# Patient Record
Sex: Male | Born: 1980 | Race: Black or African American | Hispanic: No | Marital: Single | Smoking: Current some day smoker
Health system: Southern US, Community
[De-identification: ages and names within clinical notes are randomized; demographics above are authoritative.]

## PROBLEM LIST (undated history)

## (undated) ENCOUNTER — Emergency Department (HOSPITAL_COMMUNITY): Admission: EM | Payer: Commercial Managed Care - HMO | Source: Home / Self Care

## (undated) DIAGNOSIS — R569 Unspecified convulsions: Secondary | ICD-10-CM

## (undated) DIAGNOSIS — I209 Angina pectoris, unspecified: Secondary | ICD-10-CM

## (undated) DIAGNOSIS — U071 COVID-19: Secondary | ICD-10-CM

## (undated) DIAGNOSIS — R0602 Shortness of breath: Secondary | ICD-10-CM

## (undated) DIAGNOSIS — I739 Peripheral vascular disease, unspecified: Secondary | ICD-10-CM

## (undated) DIAGNOSIS — C50919 Malignant neoplasm of unspecified site of unspecified female breast: Secondary | ICD-10-CM

## (undated) DIAGNOSIS — J9601 Acute respiratory failure with hypoxia: Secondary | ICD-10-CM

## (undated) DIAGNOSIS — I2510 Atherosclerotic heart disease of native coronary artery without angina pectoris: Secondary | ICD-10-CM

## (undated) DIAGNOSIS — T884XXA Failed or difficult intubation, initial encounter: Secondary | ICD-10-CM

## (undated) DIAGNOSIS — E7800 Pure hypercholesterolemia, unspecified: Secondary | ICD-10-CM

## (undated) DIAGNOSIS — F329 Major depressive disorder, single episode, unspecified: Secondary | ICD-10-CM

## (undated) DIAGNOSIS — Z915 Personal history of self-harm: Secondary | ICD-10-CM

## (undated) DIAGNOSIS — C801 Malignant (primary) neoplasm, unspecified: Secondary | ICD-10-CM

## (undated) DIAGNOSIS — F32A Depression, unspecified: Secondary | ICD-10-CM

## (undated) DIAGNOSIS — R51 Headache: Secondary | ICD-10-CM

## (undated) DIAGNOSIS — F419 Anxiety disorder, unspecified: Secondary | ICD-10-CM

## (undated) DIAGNOSIS — K7460 Unspecified cirrhosis of liver: Secondary | ICD-10-CM

## (undated) DIAGNOSIS — K859 Acute pancreatitis without necrosis or infection, unspecified: Secondary | ICD-10-CM

## (undated) DIAGNOSIS — F319 Bipolar disorder, unspecified: Secondary | ICD-10-CM

## (undated) DIAGNOSIS — F209 Schizophrenia, unspecified: Secondary | ICD-10-CM

## (undated) DIAGNOSIS — I10 Essential (primary) hypertension: Secondary | ICD-10-CM

## (undated) HISTORY — PX: OTHER SURGICAL HISTORY: SHX169

## (undated) HISTORY — PX: BREAST SURGERY: SHX581

## (undated) HISTORY — PX: NEPHRECTOMY: SHX65

## (undated) HISTORY — PX: CHEST SURGERY: SHX595

## (undated) HISTORY — DX: Acute respiratory failure with hypoxia: J96.01

## (undated) HISTORY — PX: MASTECTOMY: SHX3

## (undated) HISTORY — DX: Peripheral vascular disease, unspecified: I73.9

---

## 2006-08-30 ENCOUNTER — Emergency Department (HOSPITAL_COMMUNITY): Admission: EM | Admit: 2006-08-30 | Discharge: 2006-08-30 | Payer: Self-pay | Admitting: Emergency Medicine

## 2006-10-17 ENCOUNTER — Emergency Department (HOSPITAL_COMMUNITY): Admission: EM | Admit: 2006-10-17 | Discharge: 2006-10-17 | Payer: Self-pay | Admitting: Emergency Medicine

## 2006-10-18 ENCOUNTER — Inpatient Hospital Stay (HOSPITAL_COMMUNITY): Admission: EM | Admit: 2006-10-18 | Discharge: 2006-10-25 | Payer: Self-pay | Admitting: Psychiatry

## 2006-10-18 ENCOUNTER — Ambulatory Visit: Payer: Self-pay | Admitting: Psychiatry

## 2006-10-21 ENCOUNTER — Ambulatory Visit (HOSPITAL_COMMUNITY): Admission: RE | Admit: 2006-10-21 | Discharge: 2006-10-21 | Payer: Self-pay | Admitting: Psychiatry

## 2006-10-31 ENCOUNTER — Inpatient Hospital Stay (HOSPITAL_COMMUNITY): Admission: EM | Admit: 2006-10-31 | Discharge: 2006-11-03 | Payer: Self-pay | Admitting: Emergency Medicine

## 2006-12-05 ENCOUNTER — Emergency Department (HOSPITAL_COMMUNITY): Admission: EM | Admit: 2006-12-05 | Discharge: 2006-12-05 | Payer: Self-pay | Admitting: Emergency Medicine

## 2006-12-16 ENCOUNTER — Ambulatory Visit: Payer: Self-pay | Admitting: Internal Medicine

## 2006-12-16 LAB — CONVERTED CEMR LAB: Creatinine, Ser: 1 mg/dL

## 2006-12-19 ENCOUNTER — Ambulatory Visit: Payer: Self-pay | Admitting: *Deleted

## 2007-02-10 ENCOUNTER — Ambulatory Visit: Payer: Self-pay | Admitting: Internal Medicine

## 2007-02-10 LAB — CONVERTED CEMR LAB
Basophils Absolute: 0.1 10*3/uL (ref 0.0–0.1)
Basophils Relative: 1 % (ref 0–1)
HDL: 59 mg/dL (ref >39)
Hemoglobin: 15.4 g/dL (ref 13.0–17.0)
LDL Cholesterol: 318 mg/dL — ABNORMAL HIGH (ref 0–99)
Lymphocytes Relative: 40 % (ref 12–46)
Monocytes Absolute: 0.4 10*3/uL (ref 0.2–0.7)
Monocytes Relative: 9 % (ref 3–11)
Neutro Abs: 1.8 10*3/uL (ref 1.7–7.7)
Neutrophils Relative %: 41 % — ABNORMAL LOW (ref 43–77)
RBC: 5.17 M/uL (ref 4.22–5.81)
Total CHOL/HDL Ratio: 6.8
Triglycerides: 126 mg/dL (ref <150)

## 2007-02-23 ENCOUNTER — Ambulatory Visit: Payer: Self-pay | Admitting: Internal Medicine

## 2007-03-07 ENCOUNTER — Ambulatory Visit (HOSPITAL_COMMUNITY): Admission: RE | Admit: 2007-03-07 | Discharge: 2007-03-07 | Payer: Self-pay | Admitting: Internal Medicine

## 2007-03-07 DIAGNOSIS — R928 Other abnormal and inconclusive findings on diagnostic imaging of breast: Secondary | ICD-10-CM | POA: Insufficient documentation

## 2007-03-16 ENCOUNTER — Telehealth (INDEPENDENT_AMBULATORY_CARE_PROVIDER_SITE_OTHER): Payer: Self-pay | Admitting: *Deleted

## 2007-03-21 ENCOUNTER — Encounter (INDEPENDENT_AMBULATORY_CARE_PROVIDER_SITE_OTHER): Payer: Self-pay | Admitting: Internal Medicine

## 2007-03-21 DIAGNOSIS — I10 Essential (primary) hypertension: Secondary | ICD-10-CM | POA: Insufficient documentation

## 2007-03-21 DIAGNOSIS — E7800 Pure hypercholesterolemia, unspecified: Secondary | ICD-10-CM | POA: Insufficient documentation

## 2007-03-21 DIAGNOSIS — E785 Hyperlipidemia, unspecified: Secondary | ICD-10-CM | POA: Insufficient documentation

## 2007-03-21 HISTORY — DX: Essential (primary) hypertension: I10

## 2007-03-25 DIAGNOSIS — F1021 Alcohol dependence, in remission: Secondary | ICD-10-CM | POA: Insufficient documentation

## 2007-03-25 DIAGNOSIS — F17200 Nicotine dependence, unspecified, uncomplicated: Secondary | ICD-10-CM

## 2007-03-25 DIAGNOSIS — F640 Transsexualism: Secondary | ICD-10-CM

## 2007-04-06 ENCOUNTER — Ambulatory Visit: Payer: Self-pay | Admitting: Internal Medicine

## 2007-04-13 ENCOUNTER — Ambulatory Visit: Payer: Self-pay | Admitting: Internal Medicine

## 2007-04-24 LAB — CONVERTED CEMR LAB
ALT: 83 U/L — ABNORMAL HIGH
AST: 98 U/L — ABNORMAL HIGH
Albumin: 4.6 g/dL
Alkaline Phosphatase: 44 U/L
Bilirubin, Direct: 0.1 mg/dL
Cholesterol: 406 mg/dL — ABNORMAL HIGH
HDL: 77 mg/dL
Indirect Bilirubin: 0.7 mg/dL
LDL Cholesterol: 313 mg/dL — ABNORMAL HIGH
Total Bilirubin: 0.8 mg/dL
Total CHOL/HDL Ratio: 5.3
Total Protein: 7.9 g/dL
Triglycerides: 80 mg/dL
VLDL: 16 mg/dL

## 2007-05-01 ENCOUNTER — Encounter (INDEPENDENT_AMBULATORY_CARE_PROVIDER_SITE_OTHER): Payer: Self-pay | Admitting: Internal Medicine

## 2007-05-01 DIAGNOSIS — D4959 Neoplasm of unspecified behavior of other genitourinary organ: Secondary | ICD-10-CM

## 2007-05-02 ENCOUNTER — Inpatient Hospital Stay (HOSPITAL_COMMUNITY): Admission: EM | Admit: 2007-05-02 | Discharge: 2007-05-08 | Payer: Self-pay | Admitting: Emergency Medicine

## 2007-05-09 ENCOUNTER — Telehealth (INDEPENDENT_AMBULATORY_CARE_PROVIDER_SITE_OTHER): Payer: Self-pay | Admitting: *Deleted

## 2007-05-23 ENCOUNTER — Ambulatory Visit: Payer: Self-pay | Admitting: Internal Medicine

## 2007-05-23 DIAGNOSIS — F1910 Other psychoactive substance abuse, uncomplicated: Secondary | ICD-10-CM | POA: Insufficient documentation

## 2007-05-23 LAB — CONVERTED CEMR LAB
Bilirubin Urine: NEGATIVE
Blood in Urine, dipstick: NEGATIVE
Glucose, Urine, Semiquant: NEGATIVE
Ketones, urine, test strip: NEGATIVE
Protein, U semiquant: NEGATIVE
pH: 7.5

## 2007-05-25 ENCOUNTER — Ambulatory Visit (HOSPITAL_COMMUNITY): Admission: RE | Admit: 2007-05-25 | Discharge: 2007-05-25 | Payer: Self-pay | Admitting: Internal Medicine

## 2007-05-26 ENCOUNTER — Ambulatory Visit: Payer: Self-pay | Admitting: Internal Medicine

## 2007-05-28 ENCOUNTER — Encounter (INDEPENDENT_AMBULATORY_CARE_PROVIDER_SITE_OTHER): Payer: Self-pay | Admitting: Internal Medicine

## 2007-06-22 ENCOUNTER — Ambulatory Visit: Payer: Self-pay | Admitting: Internal Medicine

## 2007-06-22 DIAGNOSIS — R5383 Other fatigue: Secondary | ICD-10-CM

## 2007-06-22 DIAGNOSIS — R5381 Other malaise: Secondary | ICD-10-CM | POA: Insufficient documentation

## 2007-06-26 ENCOUNTER — Ambulatory Visit: Payer: Self-pay | Admitting: Internal Medicine

## 2007-07-02 ENCOUNTER — Encounter (INDEPENDENT_AMBULATORY_CARE_PROVIDER_SITE_OTHER): Payer: Self-pay | Admitting: Internal Medicine

## 2007-07-02 LAB — CONVERTED CEMR LAB
BUN: 13 mg/dL (ref 6–23)
CO2: 27 meq/L (ref 19–32)
Cholesterol: 260 mg/dL — ABNORMAL HIGH (ref 0–200)
Eosinophils Absolute: 0.2 10*3/uL (ref 0.2–0.7)
Eosinophils Relative: 6 % — ABNORMAL HIGH (ref 0–5)
Glucose, Bld: 79 mg/dL (ref 70–99)
HCT: 43.8 % (ref 39.0–52.0)
HDL: 43 mg/dL (ref >39)
Hemoglobin: 14.6 g/dL (ref 13.0–17.0)
Lymphocytes Relative: 45 % (ref 12–46)
Lymphs Abs: 1.7 10*3/uL (ref 0.7–4.0)
MCV: 87.4 fL (ref 78.0–100.0)
Monocytes Absolute: 0.4 10*3/uL (ref 0.1–1.0)
Monocytes Relative: 10 % (ref 3–12)
Platelets: 228 10*3/uL (ref 150–400)
RBC: 5.01 M/uL (ref 4.22–5.81)
Sodium: 142 meq/L (ref 135–145)
Total Bilirubin: 0.4 mg/dL (ref 0.3–1.2)
Total Protein: 7.2 g/dL (ref 6.0–8.3)
Triglycerides: 77 mg/dL (ref <150)
VLDL: 15 mg/dL (ref 0–40)
WBC: 3.9 10*3/uL — ABNORMAL LOW (ref 4.0–10.5)

## 2007-08-22 ENCOUNTER — Encounter (INDEPENDENT_AMBULATORY_CARE_PROVIDER_SITE_OTHER): Payer: Self-pay | Admitting: Internal Medicine

## 2007-10-09 ENCOUNTER — Emergency Department (HOSPITAL_COMMUNITY): Admission: EM | Admit: 2007-10-09 | Discharge: 2007-10-10 | Payer: Self-pay | Admitting: Emergency Medicine

## 2007-12-17 ENCOUNTER — Encounter (INDEPENDENT_AMBULATORY_CARE_PROVIDER_SITE_OTHER): Payer: Self-pay | Admitting: Internal Medicine

## 2007-12-17 ENCOUNTER — Emergency Department (HOSPITAL_COMMUNITY): Admission: EM | Admit: 2007-12-17 | Discharge: 2007-12-17 | Payer: Self-pay | Admitting: Emergency Medicine

## 2007-12-22 ENCOUNTER — Ambulatory Visit: Payer: Self-pay | Admitting: Internal Medicine

## 2007-12-22 DIAGNOSIS — R1013 Epigastric pain: Secondary | ICD-10-CM | POA: Insufficient documentation

## 2007-12-22 DIAGNOSIS — L0231 Cutaneous abscess of buttock: Secondary | ICD-10-CM | POA: Insufficient documentation

## 2007-12-22 DIAGNOSIS — F410 Panic disorder [episodic paroxysmal anxiety] without agoraphobia: Secondary | ICD-10-CM

## 2007-12-22 DIAGNOSIS — L03317 Cellulitis of buttock: Secondary | ICD-10-CM

## 2007-12-22 LAB — CONVERTED CEMR LAB
Nitrite: POSITIVE
Protein, U semiquant: >=300
Urobilinogen, UA: >=8
pH: 6.5

## 2007-12-23 ENCOUNTER — Encounter (INDEPENDENT_AMBULATORY_CARE_PROVIDER_SITE_OTHER): Payer: Self-pay | Admitting: Internal Medicine

## 2007-12-25 ENCOUNTER — Encounter (INDEPENDENT_AMBULATORY_CARE_PROVIDER_SITE_OTHER): Payer: Self-pay | Admitting: *Deleted

## 2007-12-29 ENCOUNTER — Ambulatory Visit: Payer: Self-pay | Admitting: Internal Medicine

## 2007-12-29 DIAGNOSIS — R634 Abnormal weight loss: Secondary | ICD-10-CM | POA: Insufficient documentation

## 2007-12-29 DIAGNOSIS — R945 Abnormal results of liver function studies: Secondary | ICD-10-CM

## 2007-12-29 DIAGNOSIS — R7989 Other specified abnormal findings of blood chemistry: Secondary | ICD-10-CM | POA: Insufficient documentation

## 2007-12-29 LAB — CONVERTED CEMR LAB
ALT: 115 units/L — ABNORMAL HIGH (ref 0–53)
AST: 76 units/L — ABNORMAL HIGH (ref 0–37)
Albumin: 4.1 g/dL (ref 3.5–5.2)
Alkaline Phosphatase: 37 units/L — ABNORMAL LOW (ref 39–117)
BUN: 7 mg/dL (ref 6–23)
Bilirubin, Direct: 0.1 mg/dL (ref 0.0–0.3)
CO2: 23 meq/L (ref 19–32)
Calcium: 9.2 mg/dL (ref 8.4–10.5)
Chloride: 100 meq/L (ref 96–112)
Cholesterol: 259 mg/dL — ABNORMAL HIGH (ref 0–200)
Creatinine, Ser: 1.08 mg/dL (ref 0.40–1.50)
Glucose, Bld: 74 mg/dL (ref 70–99)
HCV Ab: NEGATIVE
HDL: 44 mg/dL (ref >39)
Hep A Total Ab: NEGATIVE
Hep B Core Total Ab: NEGATIVE
Hep B S Ab: POSITIVE — AB
Hepatitis B Surface Ag: NEGATIVE
Indirect Bilirubin: 0.3 mg/dL (ref 0.0–0.9)
LDL Cholesterol: 199 mg/dL — ABNORMAL HIGH (ref 0–99)
Potassium: 4.2 meq/L (ref 3.5–5.3)
Sodium: 136 meq/L (ref 135–145)
Total Bilirubin: 0.4 mg/dL (ref 0.3–1.2)
Total CHOL/HDL Ratio: 5.9
Total Protein: 7.1 g/dL (ref 6.0–8.3)
Triglycerides: 80 mg/dL (ref <150)
VLDL: 16 mg/dL (ref 0–40)

## 2008-04-04 ENCOUNTER — Inpatient Hospital Stay (HOSPITAL_COMMUNITY): Admission: EM | Admit: 2008-04-04 | Discharge: 2008-04-09 | Payer: Self-pay | Admitting: *Deleted

## 2008-04-05 ENCOUNTER — Ambulatory Visit: Payer: Self-pay | Admitting: Psychiatry

## 2008-04-12 ENCOUNTER — Ambulatory Visit: Payer: Self-pay | Admitting: Internal Medicine

## 2008-04-15 ENCOUNTER — Ambulatory Visit: Payer: Self-pay | Admitting: Internal Medicine

## 2008-04-18 ENCOUNTER — Encounter (INDEPENDENT_AMBULATORY_CARE_PROVIDER_SITE_OTHER): Payer: Self-pay | Admitting: *Deleted

## 2008-05-17 ENCOUNTER — Emergency Department (HOSPITAL_COMMUNITY): Admission: EM | Admit: 2008-05-17 | Discharge: 2008-05-17 | Payer: Self-pay | Admitting: Emergency Medicine

## 2008-05-19 ENCOUNTER — Emergency Department (HOSPITAL_COMMUNITY): Admission: EM | Admit: 2008-05-19 | Discharge: 2008-05-20 | Payer: Self-pay | Admitting: Emergency Medicine

## 2008-05-30 ENCOUNTER — Telehealth (INDEPENDENT_AMBULATORY_CARE_PROVIDER_SITE_OTHER): Payer: Self-pay | Admitting: Internal Medicine

## 2008-06-27 ENCOUNTER — Ambulatory Visit: Payer: Self-pay | Admitting: Internal Medicine

## 2008-07-19 ENCOUNTER — Emergency Department (HOSPITAL_COMMUNITY): Admission: EM | Admit: 2008-07-19 | Discharge: 2008-07-19 | Payer: Self-pay | Admitting: Emergency Medicine

## 2008-07-24 ENCOUNTER — Inpatient Hospital Stay (HOSPITAL_COMMUNITY): Admission: EM | Admit: 2008-07-24 | Discharge: 2008-07-26 | Payer: Self-pay | Admitting: Emergency Medicine

## 2008-07-24 ENCOUNTER — Emergency Department (HOSPITAL_COMMUNITY): Admission: EM | Admit: 2008-07-24 | Discharge: 2008-07-24 | Payer: Self-pay | Admitting: Emergency Medicine

## 2008-08-30 ENCOUNTER — Ambulatory Visit: Payer: Self-pay | Admitting: Internal Medicine

## 2008-09-22 ENCOUNTER — Emergency Department (HOSPITAL_COMMUNITY): Admission: EM | Admit: 2008-09-22 | Discharge: 2008-09-23 | Payer: Self-pay | Admitting: Emergency Medicine

## 2008-09-24 ENCOUNTER — Encounter (INDEPENDENT_AMBULATORY_CARE_PROVIDER_SITE_OTHER): Payer: Self-pay | Admitting: Internal Medicine

## 2008-09-26 ENCOUNTER — Encounter (INDEPENDENT_AMBULATORY_CARE_PROVIDER_SITE_OTHER): Payer: Self-pay | Admitting: Internal Medicine

## 2008-10-01 ENCOUNTER — Telehealth (INDEPENDENT_AMBULATORY_CARE_PROVIDER_SITE_OTHER): Payer: Self-pay | Admitting: Internal Medicine

## 2008-10-03 ENCOUNTER — Ambulatory Visit: Payer: Self-pay | Admitting: Internal Medicine

## 2008-10-03 DIAGNOSIS — R569 Unspecified convulsions: Secondary | ICD-10-CM

## 2008-10-08 ENCOUNTER — Encounter (INDEPENDENT_AMBULATORY_CARE_PROVIDER_SITE_OTHER): Payer: Self-pay | Admitting: Internal Medicine

## 2008-10-09 ENCOUNTER — Encounter (INDEPENDENT_AMBULATORY_CARE_PROVIDER_SITE_OTHER): Payer: Self-pay | Admitting: Internal Medicine

## 2008-10-21 ENCOUNTER — Encounter (INDEPENDENT_AMBULATORY_CARE_PROVIDER_SITE_OTHER): Payer: Self-pay | Admitting: Internal Medicine

## 2008-11-10 ENCOUNTER — Emergency Department (HOSPITAL_COMMUNITY): Admission: EM | Admit: 2008-11-10 | Discharge: 2008-11-11 | Payer: Self-pay | Admitting: Emergency Medicine

## 2008-11-12 ENCOUNTER — Emergency Department (HOSPITAL_COMMUNITY): Admission: EM | Admit: 2008-11-12 | Discharge: 2008-11-12 | Payer: Self-pay | Admitting: Emergency Medicine

## 2008-11-13 ENCOUNTER — Inpatient Hospital Stay (HOSPITAL_COMMUNITY): Admission: EM | Admit: 2008-11-13 | Discharge: 2008-11-15 | Payer: Self-pay | Admitting: Emergency Medicine

## 2008-12-06 ENCOUNTER — Ambulatory Visit: Payer: Self-pay | Admitting: Internal Medicine

## 2008-12-06 DIAGNOSIS — N63 Unspecified lump in unspecified breast: Secondary | ICD-10-CM | POA: Insufficient documentation

## 2008-12-10 ENCOUNTER — Encounter: Admission: RE | Admit: 2008-12-10 | Discharge: 2008-12-10 | Payer: Self-pay | Admitting: Internal Medicine

## 2008-12-17 ENCOUNTER — Telehealth (INDEPENDENT_AMBULATORY_CARE_PROVIDER_SITE_OTHER): Payer: Self-pay | Admitting: Internal Medicine

## 2009-01-03 ENCOUNTER — Ambulatory Visit: Payer: Self-pay | Admitting: Internal Medicine

## 2009-01-07 ENCOUNTER — Encounter (INDEPENDENT_AMBULATORY_CARE_PROVIDER_SITE_OTHER): Payer: Self-pay | Admitting: Internal Medicine

## 2009-01-08 ENCOUNTER — Telehealth (INDEPENDENT_AMBULATORY_CARE_PROVIDER_SITE_OTHER): Payer: Self-pay | Admitting: Internal Medicine

## 2009-01-08 LAB — CONVERTED CEMR LAB
ALT: 80 units/L — ABNORMAL HIGH (ref 0–53)
AST: 134 units/L — ABNORMAL HIGH (ref 0–37)
Alkaline Phosphatase: 53 units/L (ref 39–117)
Alkaline Phosphatase: 52 units/L (ref 39–117)
Glucose, Bld: 75 mg/dL (ref 70–99)
Glucose, Bld: 87 mg/dL (ref 70–99)
HDL: 49 mg/dL (ref >39)
LDL Cholesterol: 215 mg/dL — ABNORMAL HIGH (ref 0–99)
LDL Cholesterol: 137 mg/dL — ABNORMAL HIGH (ref 0–99)
Sodium: 142 meq/L (ref 135–145)
Sodium: 143 meq/L (ref 135–145)
Total Bilirubin: 0.2 mg/dL — ABNORMAL LOW (ref 0.3–1.2)
Total Bilirubin: 0.5 mg/dL (ref 0.3–1.2)
Total Protein: 7 g/dL (ref 6.0–8.3)
Total Protein: 7.2 g/dL (ref 6.0–8.3)
Triglycerides: 112 mg/dL (ref <150)
Triglycerides: 64 mg/dL (ref <150)
VLDL: 22 mg/dL (ref 0–40)
VLDL: 13 mg/dL (ref 0–40)

## 2009-01-21 ENCOUNTER — Encounter (INDEPENDENT_AMBULATORY_CARE_PROVIDER_SITE_OTHER): Payer: Self-pay | Admitting: Internal Medicine

## 2009-01-22 ENCOUNTER — Encounter (INDEPENDENT_AMBULATORY_CARE_PROVIDER_SITE_OTHER): Payer: Self-pay | Admitting: Internal Medicine

## 2009-01-31 IMAGING — CR DG CHEST 2V
2 series · 2 of 2 positions shown · non-contrast
Comparison: None

CLINICAL DATA: Shortness of breath and cough. 
 CHEST - 2 VIEW:

[w chest pa *]
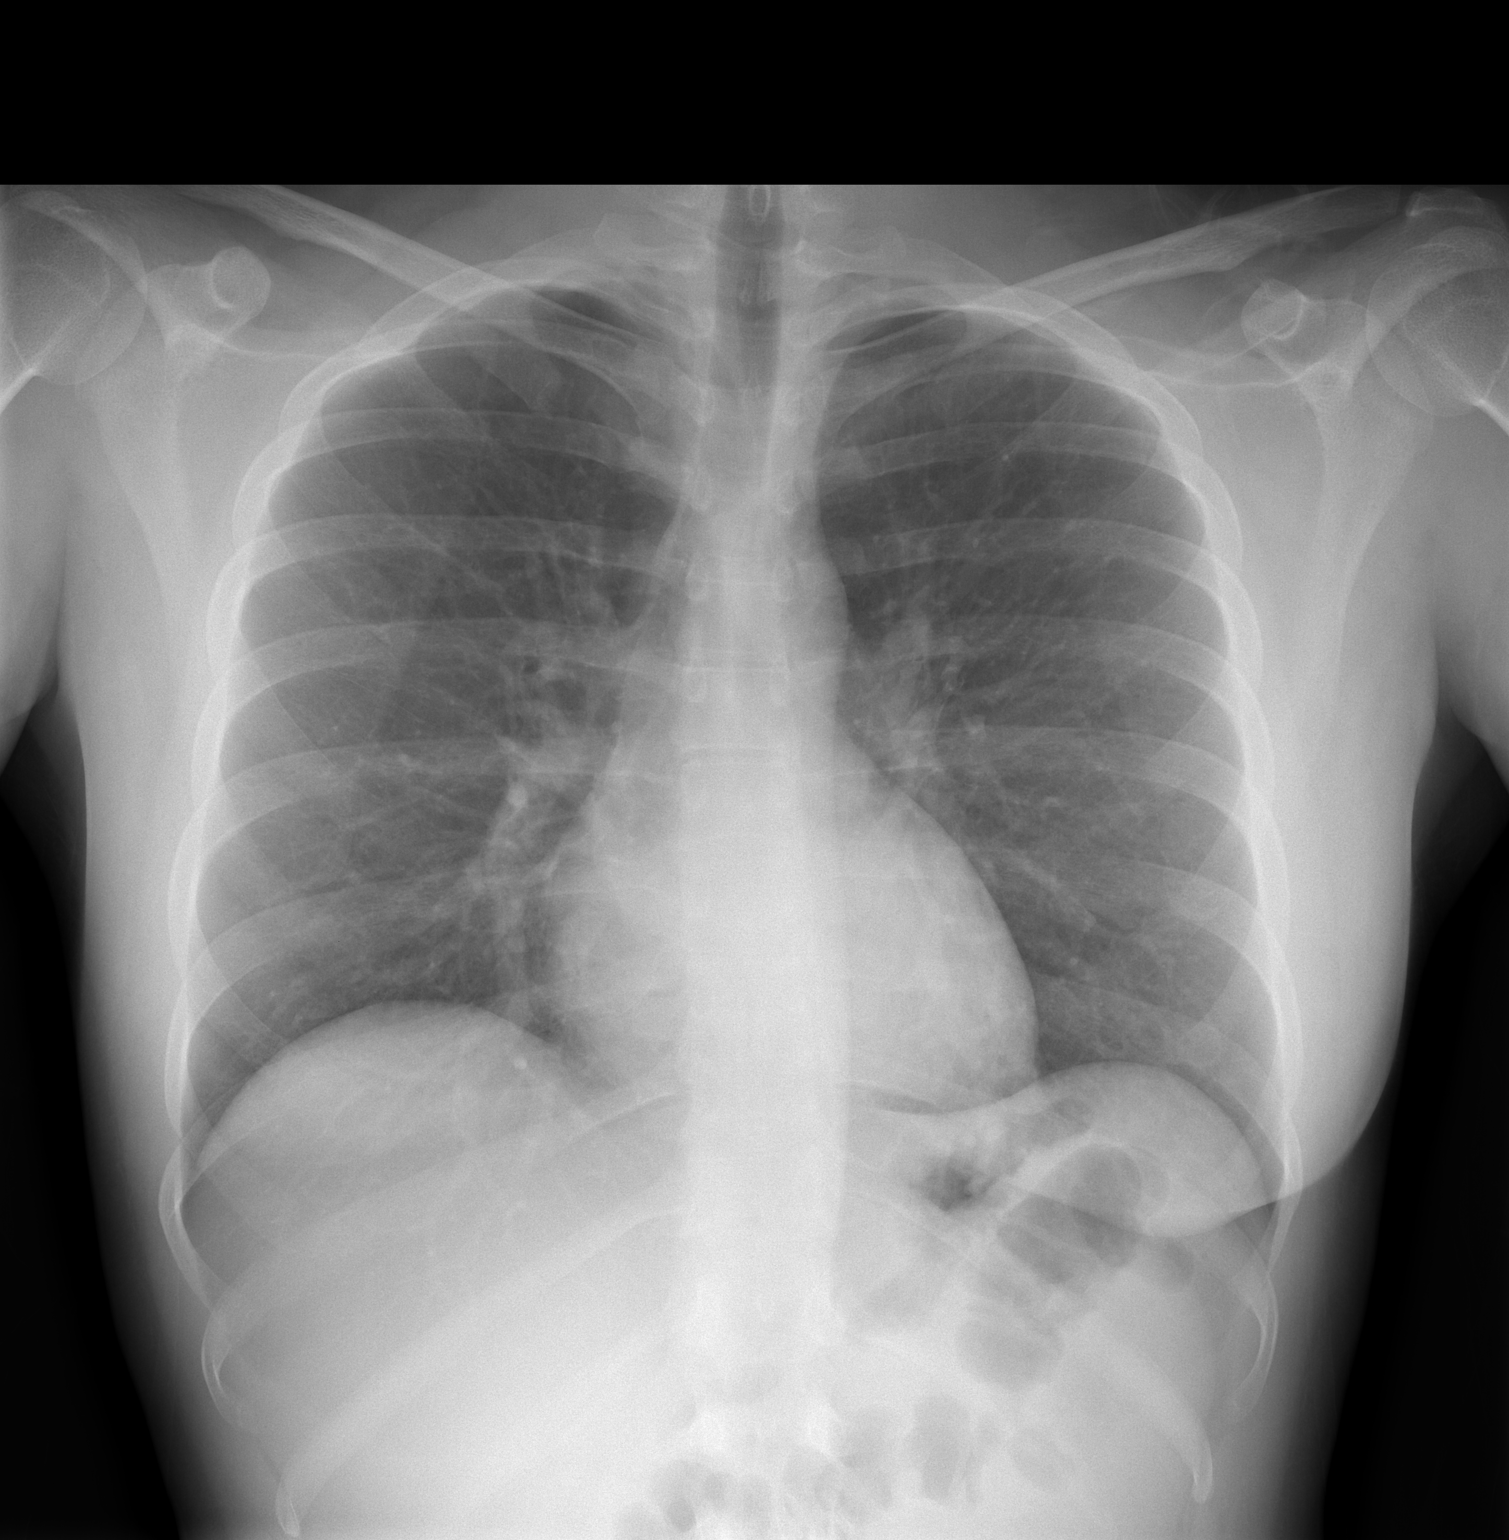

[w chest lat *]
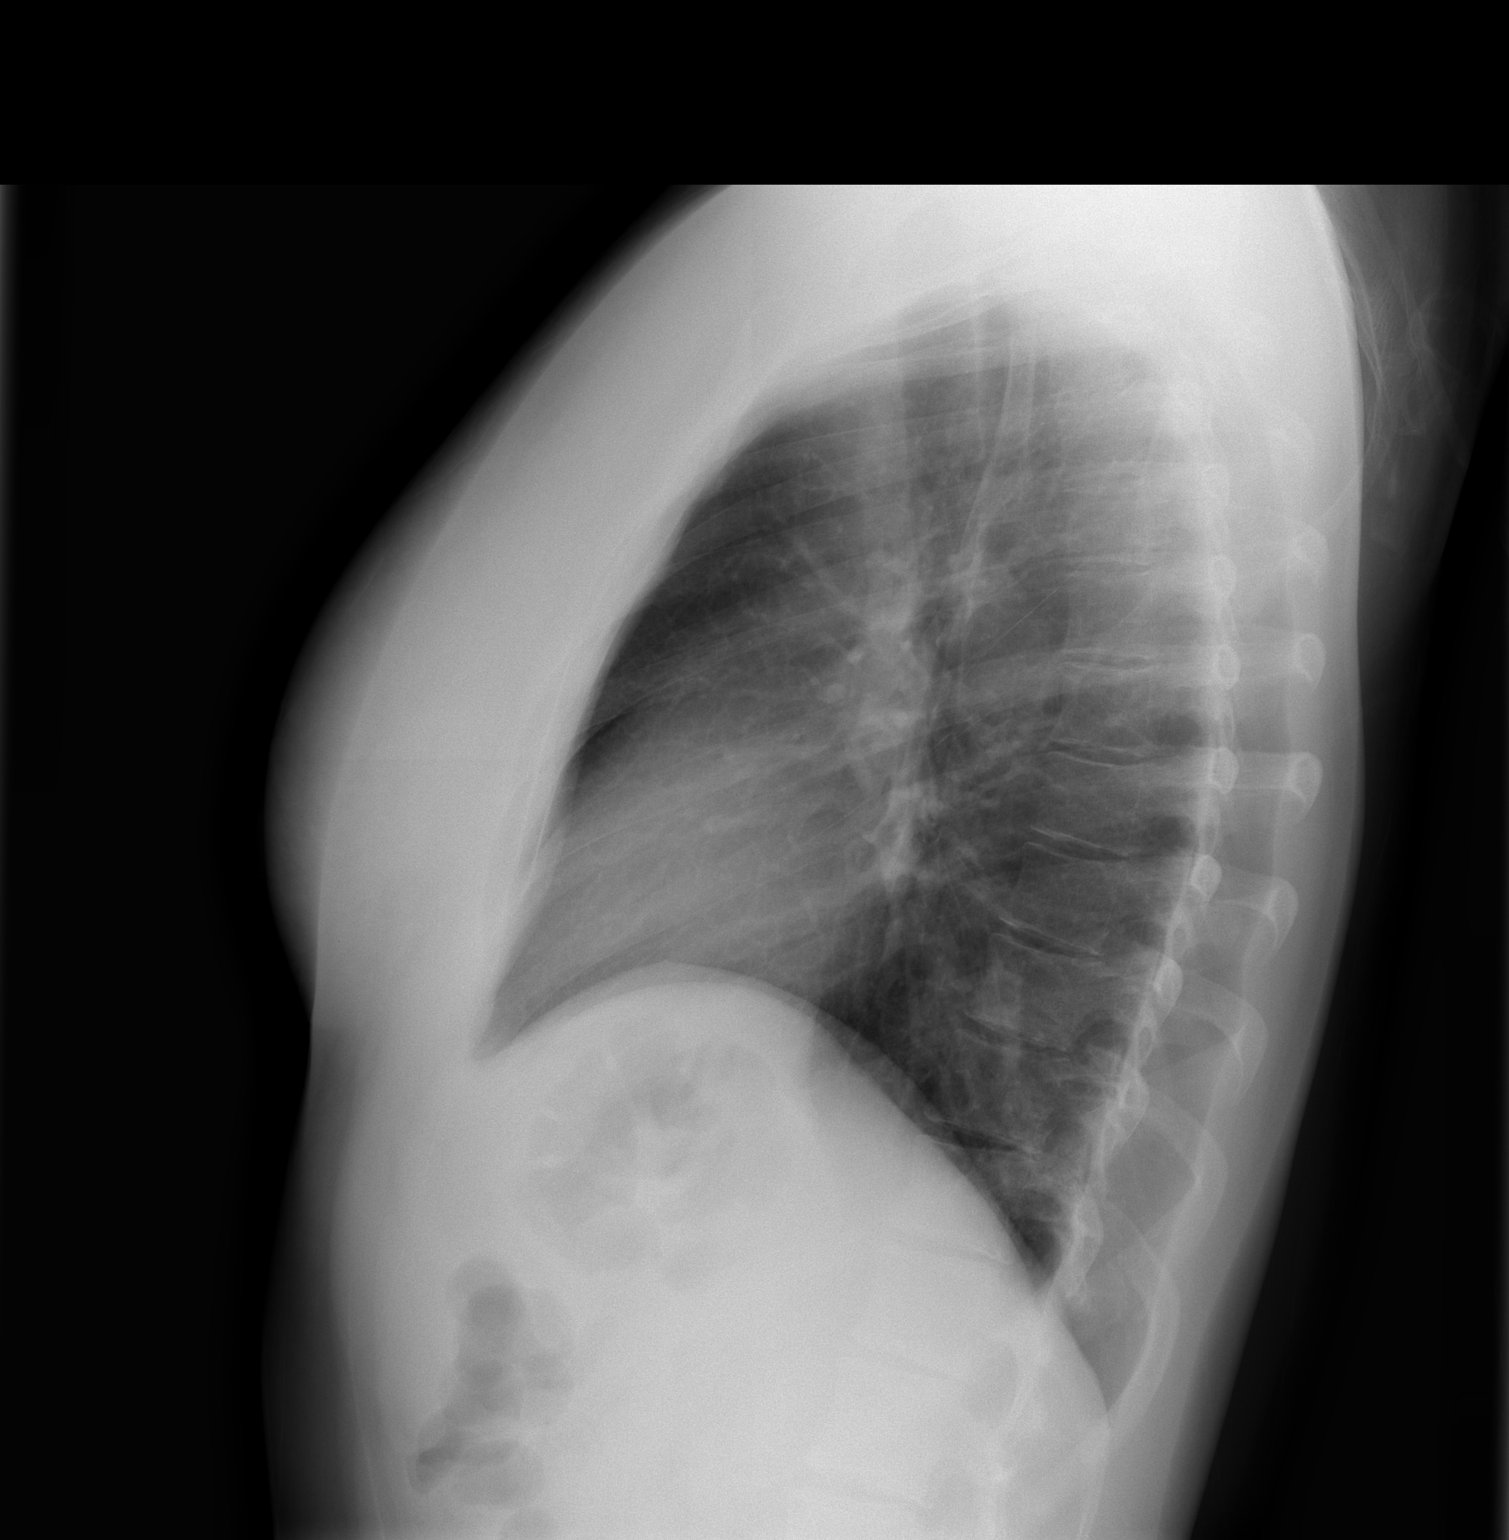

[2 of 2 positions shown; findings below may reference images not displayed]

FINDINGS: Heart size is normal. Lung volumes are suboptimal but clear.  No pleural effusion.  No pneumothorax.
IMPRESSION: No focal acute cardiopulmonary process.

## 2009-02-11 ENCOUNTER — Ambulatory Visit: Payer: Self-pay | Admitting: Internal Medicine

## 2009-02-20 ENCOUNTER — Ambulatory Visit (HOSPITAL_COMMUNITY): Admission: RE | Admit: 2009-02-20 | Discharge: 2009-02-22 | Payer: Self-pay | Admitting: General Surgery

## 2009-02-20 ENCOUNTER — Encounter (INDEPENDENT_AMBULATORY_CARE_PROVIDER_SITE_OTHER): Payer: Self-pay | Admitting: Internal Medicine

## 2009-02-20 ENCOUNTER — Encounter (INDEPENDENT_AMBULATORY_CARE_PROVIDER_SITE_OTHER): Payer: Self-pay | Admitting: General Surgery

## 2009-02-24 ENCOUNTER — Emergency Department (HOSPITAL_COMMUNITY): Admission: EM | Admit: 2009-02-24 | Discharge: 2009-02-24 | Payer: Self-pay | Admitting: Emergency Medicine

## 2009-03-03 ENCOUNTER — Encounter (INDEPENDENT_AMBULATORY_CARE_PROVIDER_SITE_OTHER): Payer: Self-pay | Admitting: Internal Medicine

## 2009-03-18 ENCOUNTER — Encounter (INDEPENDENT_AMBULATORY_CARE_PROVIDER_SITE_OTHER): Payer: Self-pay | Admitting: Internal Medicine

## 2009-03-24 IMAGING — US US ABDOMEN COMPLETE
1 series · 14 of 25 positions shown · non-contrast
Comparison: None

CLINICAL DATA: Tylenol overdose. Evaluate liver and gallbladder.

ABDOMEN ULTRASOUND
TECHNIQUE: Complete abdominal ultrasound examination was performed including
evaluation of the liver, gallbladder, bile ducts, pancreas, kidneys, spleen,
IVC, and abdominal aorta.

[Series 1: unknown · 0.27mm/px · 14 of 77 slices shown]
[im 1/77]
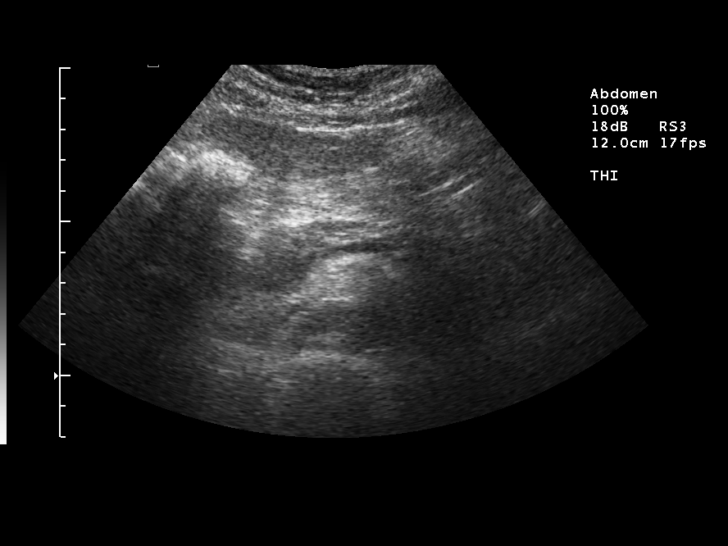
[im 7/77]
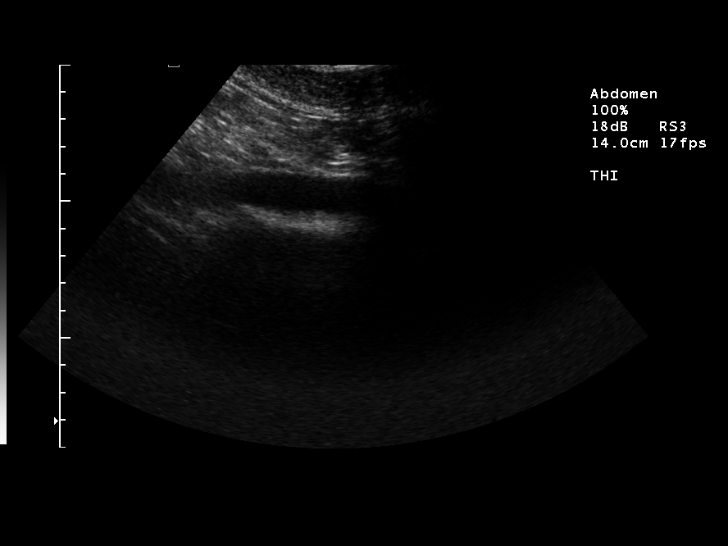
[im 13/77]
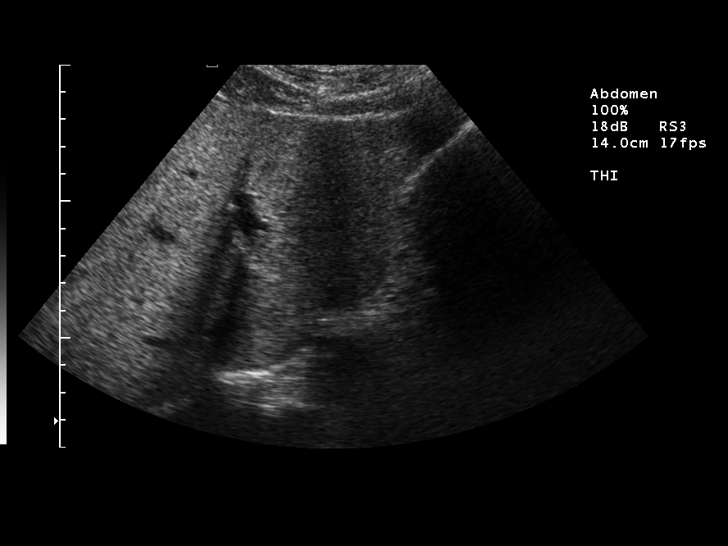
[im 20/77]
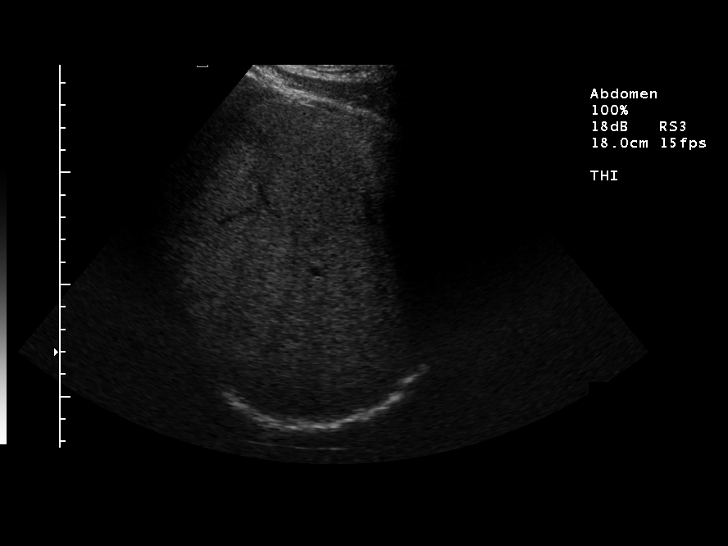
[im 26/77]
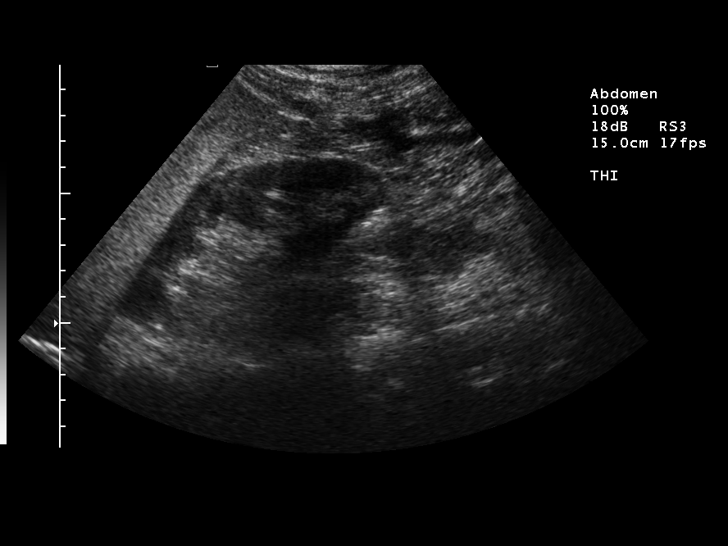
[im 29/77]
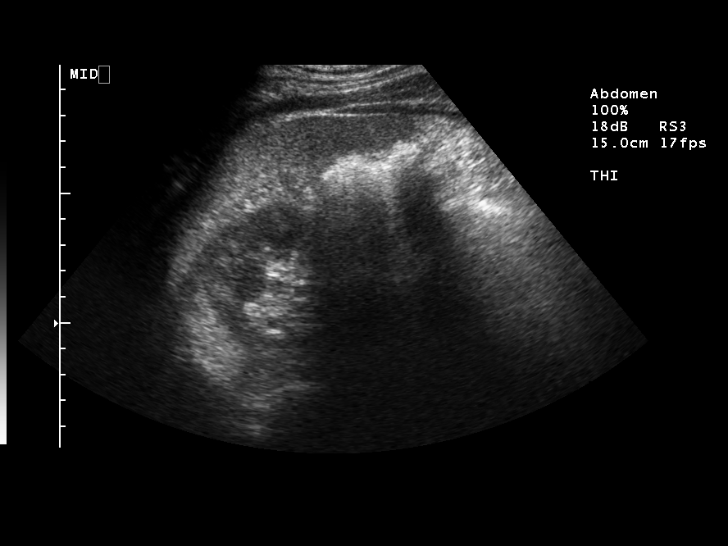
[im 35/77]
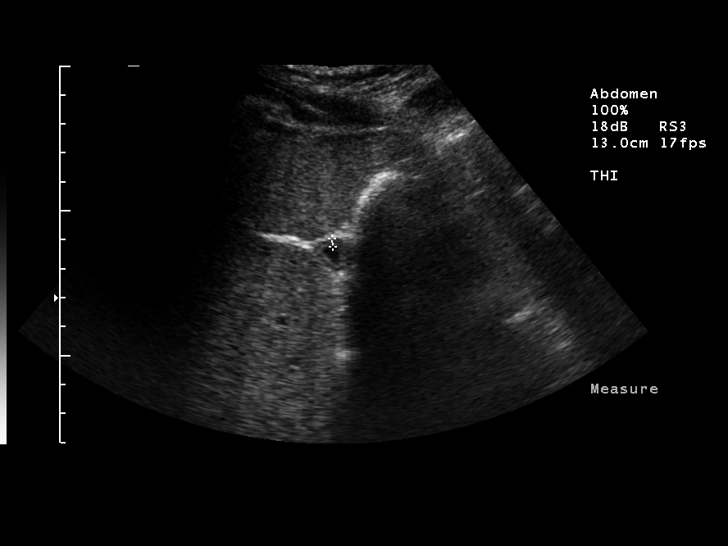
[im 42/77]
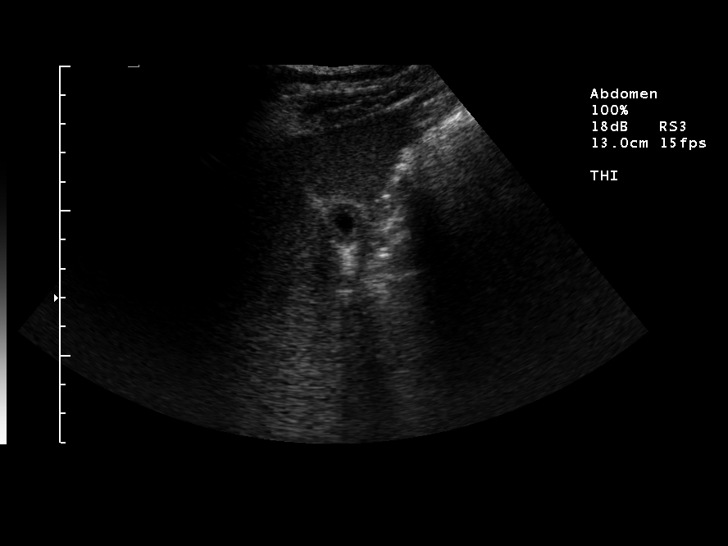
[im 48/77]
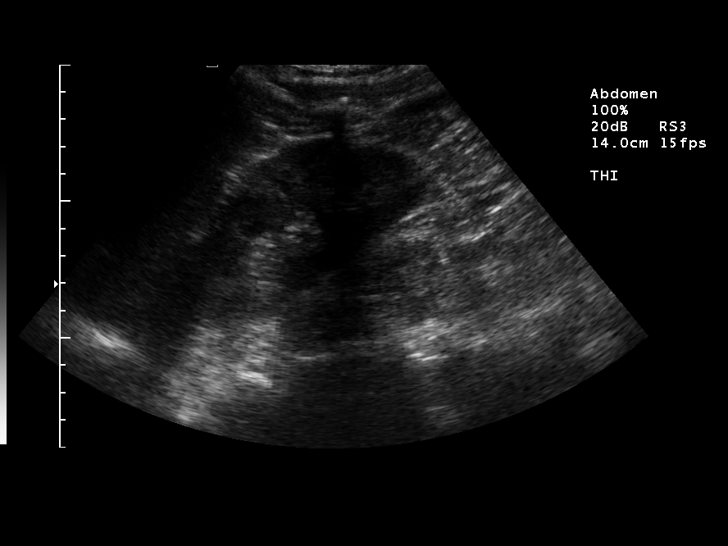
[im 51/77]
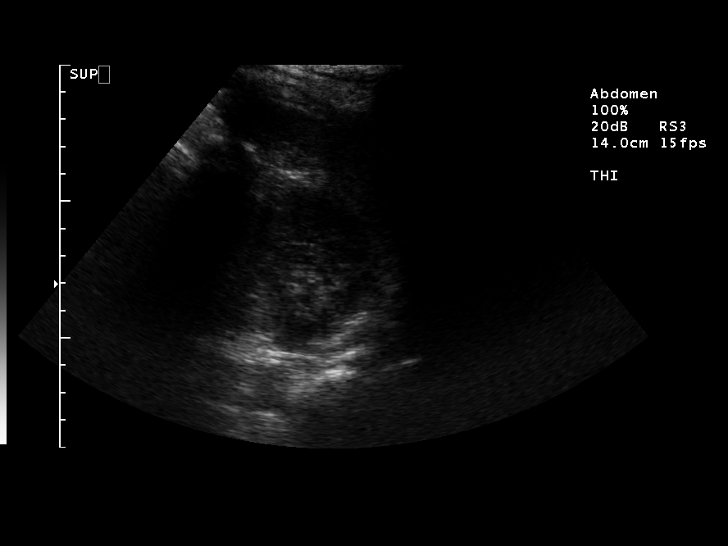
[im 58/77]
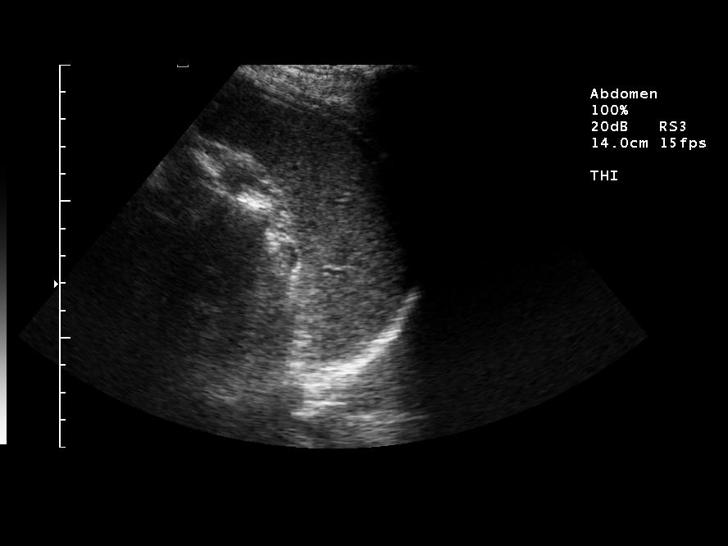
[im 64/77]
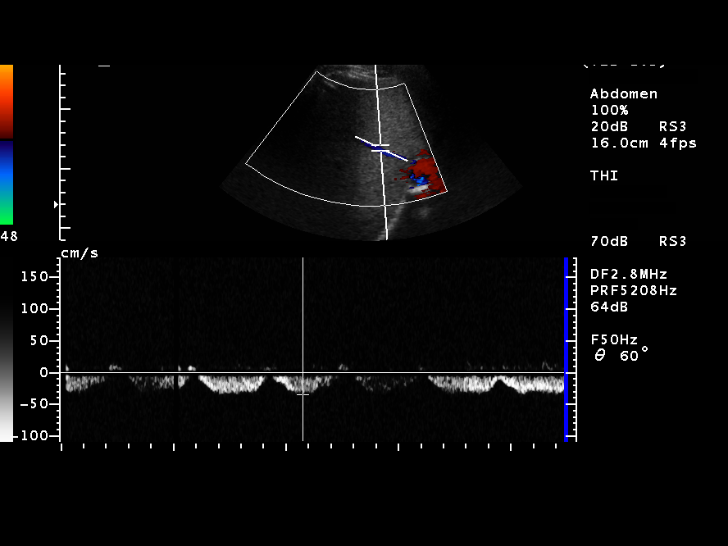
[im 70/77]
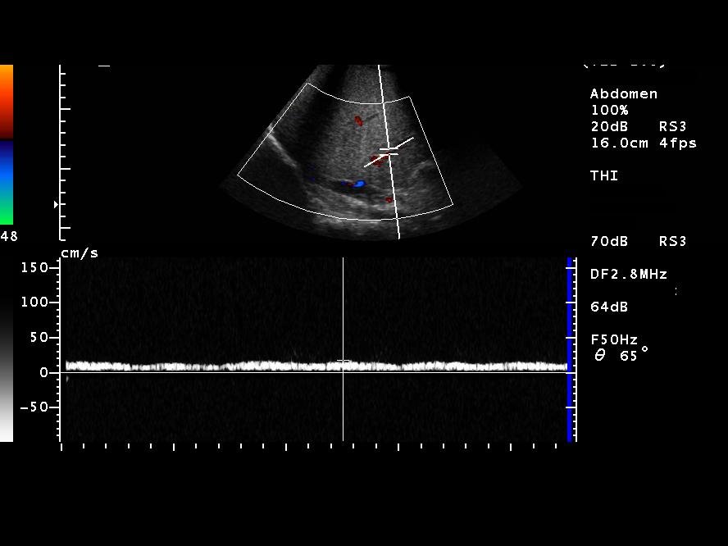
[im 77/77]
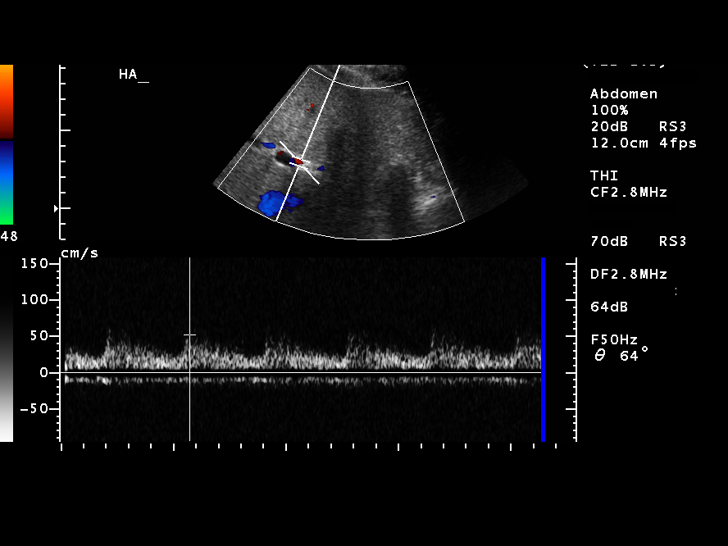

[14 of 25 positions shown; findings below may reference images not displayed]

FINDINGS: There is increased echotexture throughout the liver compatible with
mild fatty infiltration. Liver is not enlarged. Gallbladder is contracted,
causing wall thickening. No stones. Common bile duct normal at 3 mm.

IVC, pancreas, spleen, kidneys, abdominal aorta unremarkable.

IMPRESSION

Increased echotexture throughout the liver most compatible with fatty
infiltration.

Gallbladder is contracted, which causes wall thickening/accentuation. No stones.

ABDOMINAL DOPPLER:
FINDINGS: The portal vein is patent, with hepatopetal flow. Portal vein velocity
is 26 cm/sec. Hepatofugal flow seen within the hepatic veins. No ascites or
varices.
IMPRESSION: Patent portal vein with hepatopetal flow. No evidence of portal venous
hypertension.

## 2009-04-21 ENCOUNTER — Ambulatory Visit: Payer: Self-pay | Admitting: Internal Medicine

## 2009-04-24 ENCOUNTER — Ambulatory Visit: Payer: Self-pay | Admitting: Internal Medicine

## 2009-05-06 ENCOUNTER — Ambulatory Visit: Payer: Self-pay | Admitting: Internal Medicine

## 2009-05-11 ENCOUNTER — Emergency Department (HOSPITAL_COMMUNITY): Admission: EM | Admit: 2009-05-11 | Discharge: 2009-05-11 | Payer: Self-pay | Admitting: Emergency Medicine

## 2009-05-20 ENCOUNTER — Ambulatory Visit: Payer: Self-pay | Admitting: Internal Medicine

## 2009-05-28 ENCOUNTER — Encounter (INDEPENDENT_AMBULATORY_CARE_PROVIDER_SITE_OTHER): Payer: Self-pay | Admitting: Internal Medicine

## 2009-05-28 DIAGNOSIS — F339 Major depressive disorder, recurrent, unspecified: Secondary | ICD-10-CM | POA: Insufficient documentation

## 2009-06-10 LAB — CONVERTED CEMR LAB
HDL: 52 mg/dL (ref >39)
LDL Cholesterol: 180 mg/dL — ABNORMAL HIGH (ref 0–99)
Total CHOL/HDL Ratio: 4.8
Triglycerides: 76 mg/dL (ref <150)
VLDL: 15 mg/dL (ref 0–40)

## 2009-06-24 ENCOUNTER — Inpatient Hospital Stay (HOSPITAL_COMMUNITY): Admission: EM | Admit: 2009-06-24 | Discharge: 2009-06-28 | Payer: Self-pay | Admitting: Psychiatry

## 2009-06-24 ENCOUNTER — Emergency Department (HOSPITAL_COMMUNITY): Admission: EM | Admit: 2009-06-24 | Discharge: 2009-06-24 | Payer: Self-pay | Admitting: Emergency Medicine

## 2009-06-24 ENCOUNTER — Ambulatory Visit: Payer: Self-pay | Admitting: Psychiatry

## 2009-06-24 ENCOUNTER — Encounter (INDEPENDENT_AMBULATORY_CARE_PROVIDER_SITE_OTHER): Payer: Self-pay | Admitting: Internal Medicine

## 2009-07-03 ENCOUNTER — Telehealth (INDEPENDENT_AMBULATORY_CARE_PROVIDER_SITE_OTHER): Payer: Self-pay | Admitting: Internal Medicine

## 2009-07-11 ENCOUNTER — Emergency Department (HOSPITAL_COMMUNITY): Admission: EM | Admit: 2009-07-11 | Discharge: 2009-07-11 | Payer: Self-pay | Admitting: Emergency Medicine

## 2009-07-22 ENCOUNTER — Telehealth (INDEPENDENT_AMBULATORY_CARE_PROVIDER_SITE_OTHER): Payer: Self-pay | Admitting: Internal Medicine

## 2009-08-14 ENCOUNTER — Ambulatory Visit: Payer: Self-pay | Admitting: Internal Medicine

## 2009-08-26 ENCOUNTER — Ambulatory Visit: Payer: Self-pay | Admitting: Internal Medicine

## 2009-09-12 ENCOUNTER — Ambulatory Visit: Payer: Self-pay | Admitting: Internal Medicine

## 2009-09-15 ENCOUNTER — Ambulatory Visit: Payer: Self-pay | Admitting: Internal Medicine

## 2009-09-29 ENCOUNTER — Encounter (INDEPENDENT_AMBULATORY_CARE_PROVIDER_SITE_OTHER): Payer: Self-pay | Admitting: *Deleted

## 2009-09-29 ENCOUNTER — Emergency Department (HOSPITAL_COMMUNITY): Admission: EM | Admit: 2009-09-29 | Discharge: 2009-09-30 | Payer: Self-pay | Admitting: Emergency Medicine

## 2009-09-29 LAB — CONVERTED CEMR LAB
AST: 28 units/L (ref 0–37)
Alkaline Phosphatase: 53 units/L (ref 39–117)
Bilirubin, Direct: <0.1 mg/dL (ref 0.0–0.3)
Total Bilirubin: 0.2 mg/dL — ABNORMAL LOW (ref 0.3–1.2)

## 2009-10-05 IMAGING — US US ABDOMEN COMPLETE
1 series · 13 of 25 positions shown · non-contrast
Comparison: Prior study done in [DATE], although this was not a routine abdominal CT ? rather it was primarily done as a Doppler evaluation.

CLINICAL DATA: Alcoholic hepatitis/hematuria.
 ABDOMEN ULTRASOUND:
TECHNIQUE: Complete abdominal ultrasound examination was performed including evaluation of the liver, gallbladder, bile ducts, pancreas, kidneys, spleen, IVC, and abdominal aorta.

[Series 1: abdomen · 0.30mm/px · 13 of 86 slices shown]
[im 1/86]
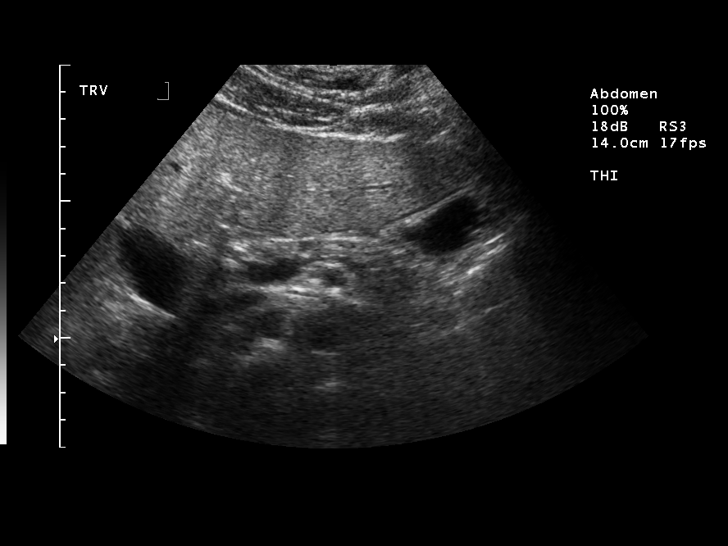
[im 8/86]
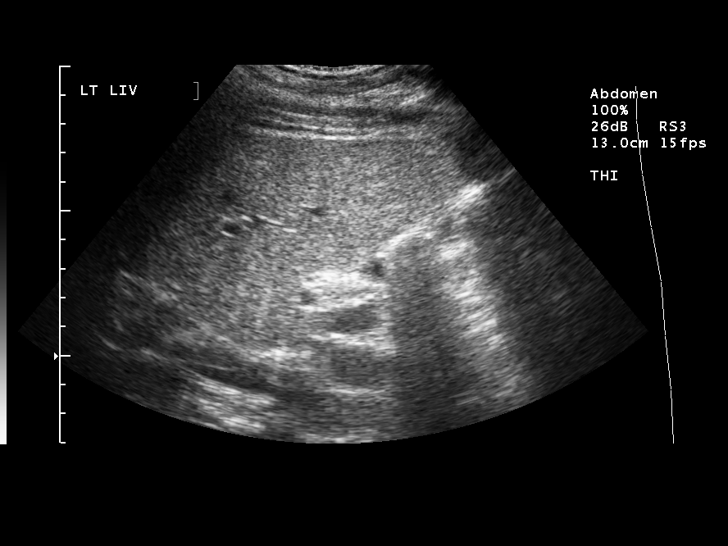
[im 15/86]
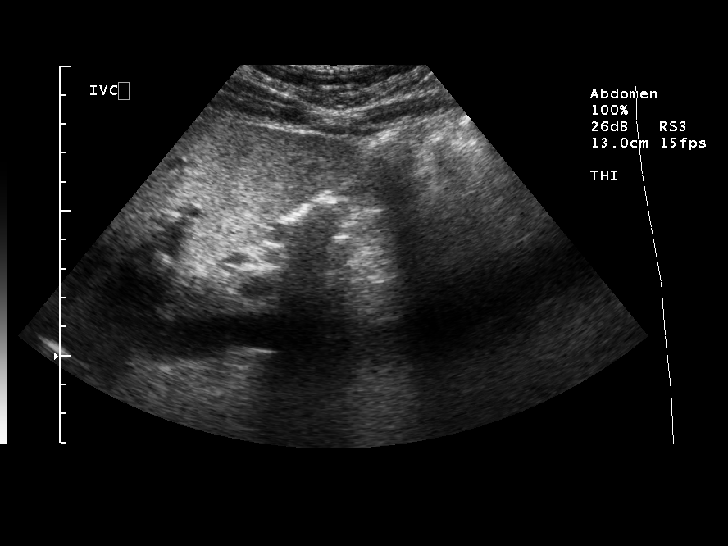
[im 22/86]
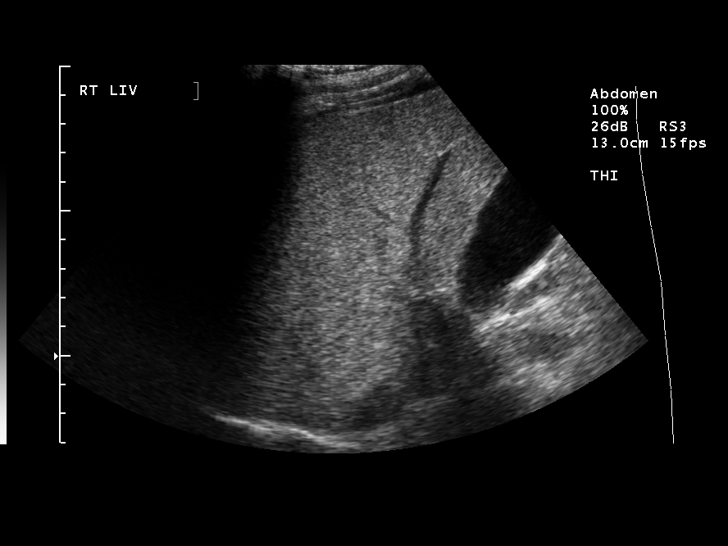
[im 29/86]
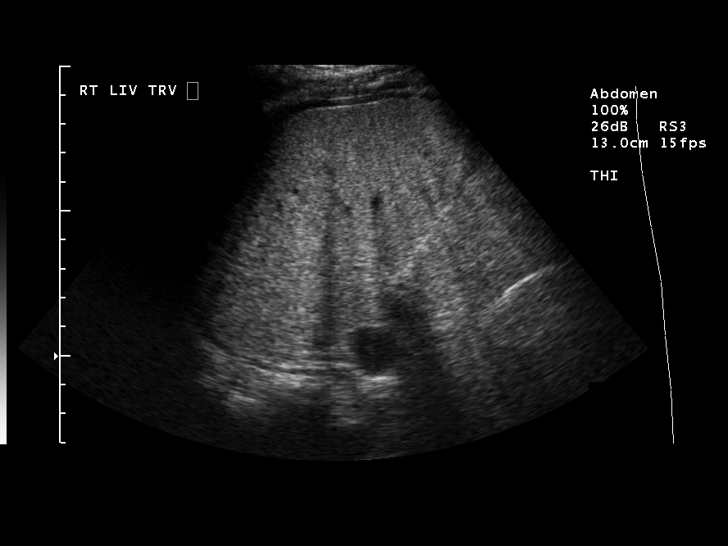
[im 36/86]
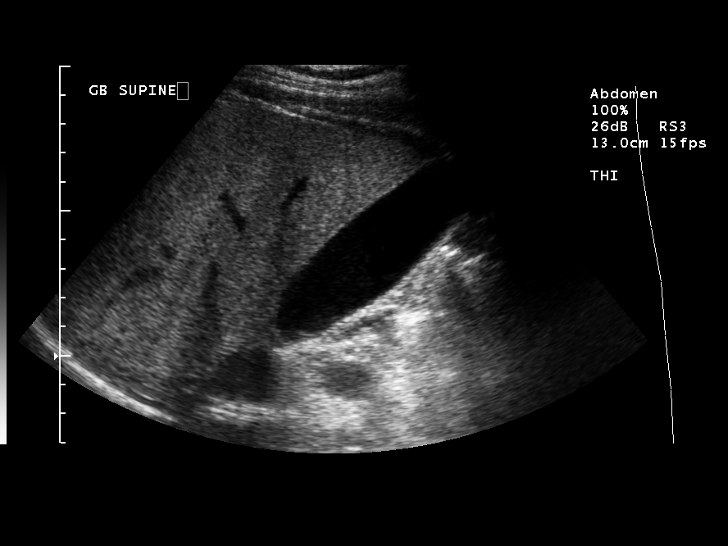
[im 43/86]
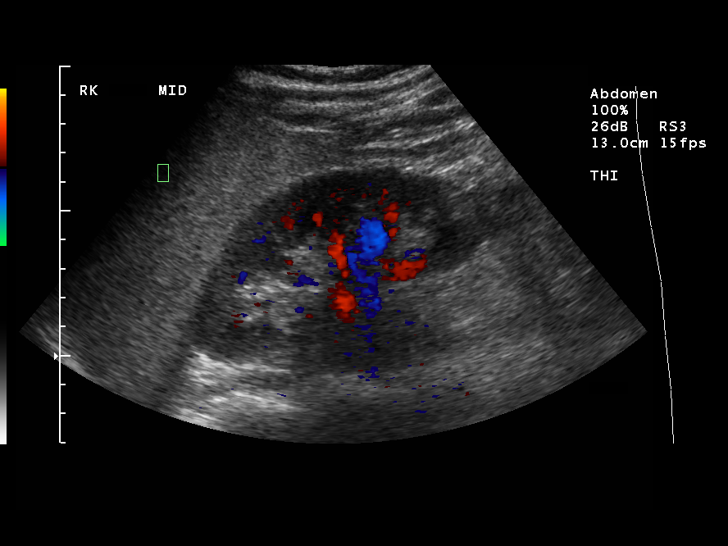
[im 50/86]
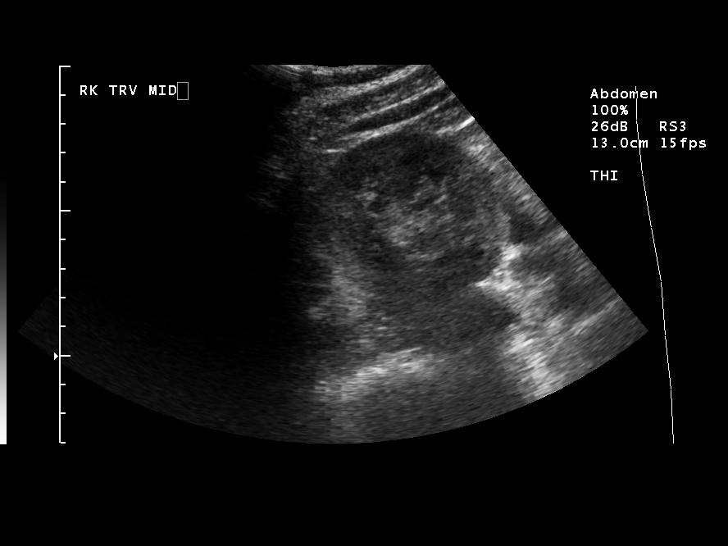
[im 57/86]
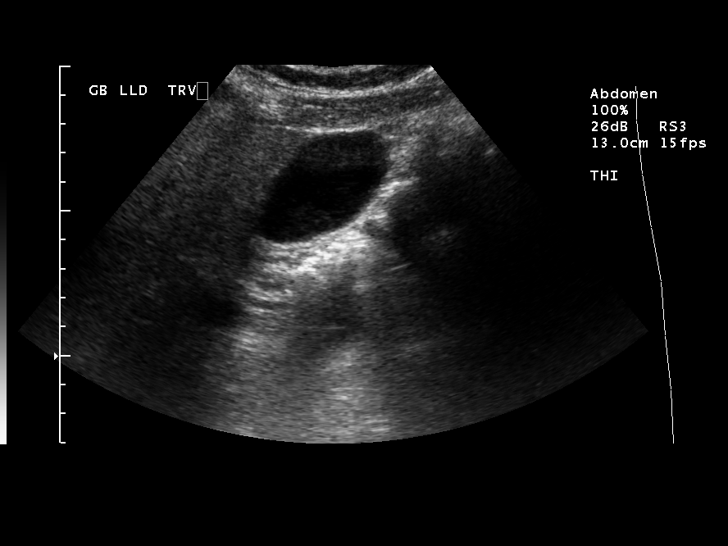
[im 64/86]
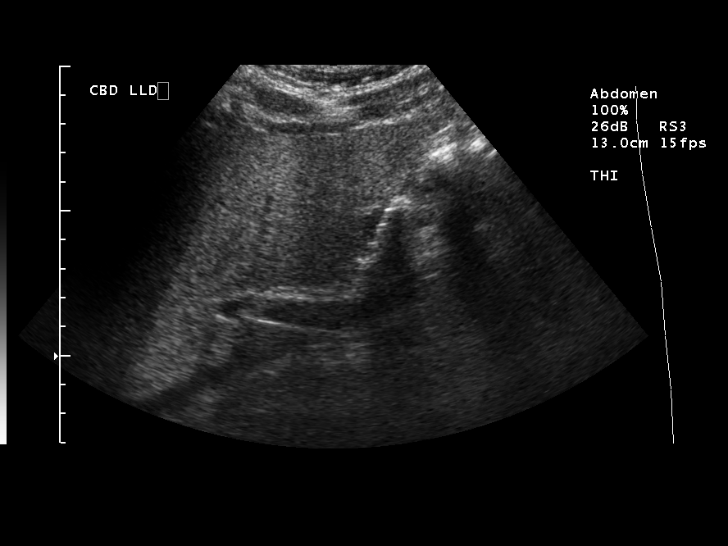
[im 71/86]
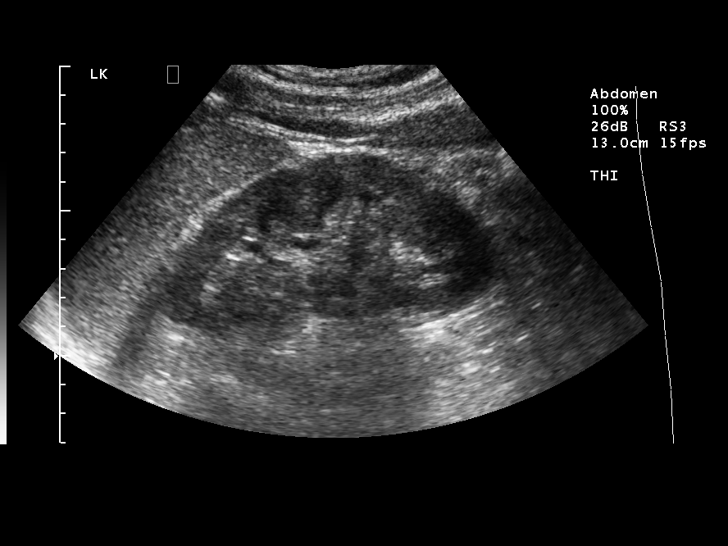
[im 78/86]
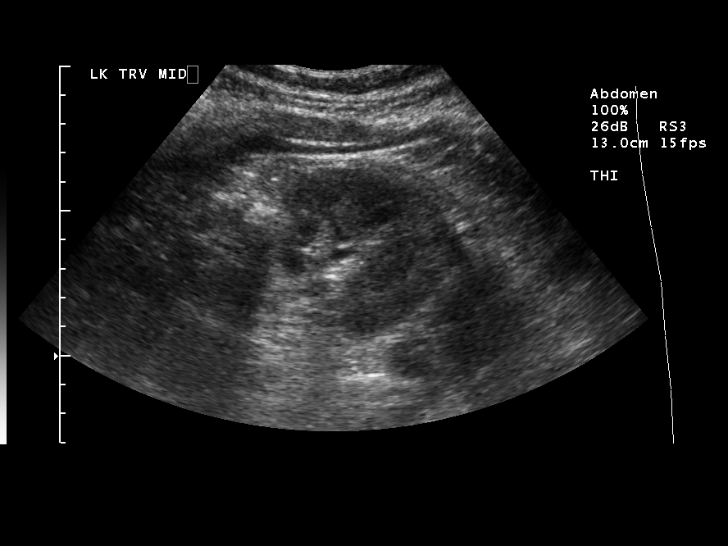
[im 86/86]
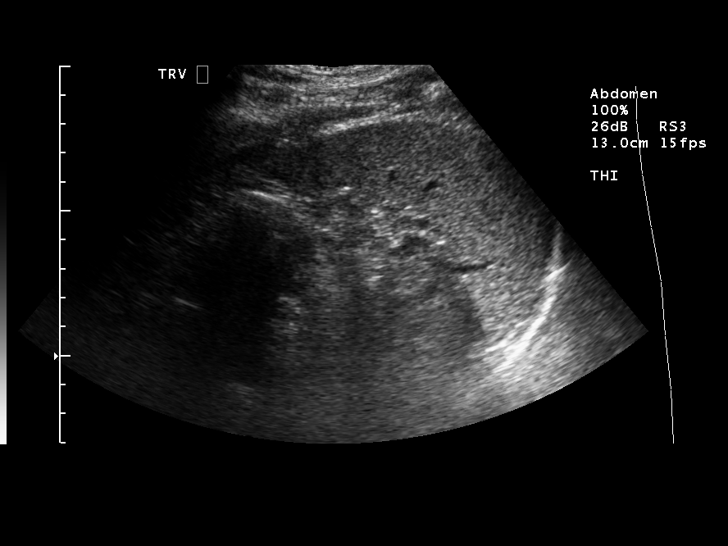

[13 of 25 positions shown; findings below may reference images not displayed]

FINDINGS: Gallbladder and bile ducts are normal.  The common duct is measured at 4.5 mm.  The liver is diffusely increased in echogenicity without focal lesions.  This is consistent with fatty infiltration.
 The pancreas is normal.
 The spleen, aorta and IVC are normal.  The kidneys are near normal.  There is slight inhomogeneity involving the anterior aspect of the left lower kidney.  Looking back at the CT a bit earlier today, the patient has a complex cystic lesion.  This is clearly not a simple cyst by ultrasound and warrants further assessment by MRI, which was recommended in the report of CT.
IMPRESSION: 1. Diffuse fatty infiltration of the liver.
 2. The lesion known to be present in the anterior aspect of the lower left kidney is barely visible on ultrasound ? it is clearly not a simple cyst.  See discussion above with reference to CT scan done earlier today.

## 2009-10-05 IMAGING — US US RENAL
1 series · 14 of 25 positions shown · non-contrast
Comparison: Earlier abdominal ultrasound and CT.

CLINICAL DATA: Hematuria.
 RENAL/URINARY TRACT ULTRASOUND:
TECHNIQUE: Complete ultrasound examination of the urinary tract was performed including evaluation of the kidneys, renal collecting systems, and urinary bladder.

[Series 1: renal · 0.27mm/px · 14 of 38 slices shown]
[im 1/38]
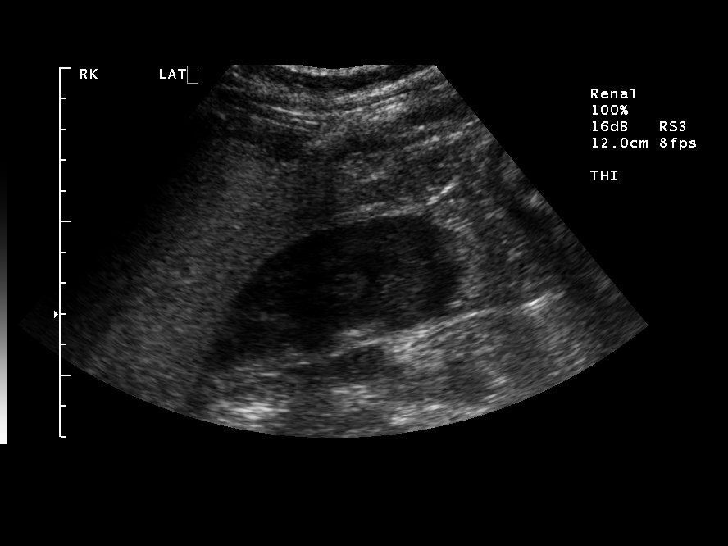
[im 4/38]
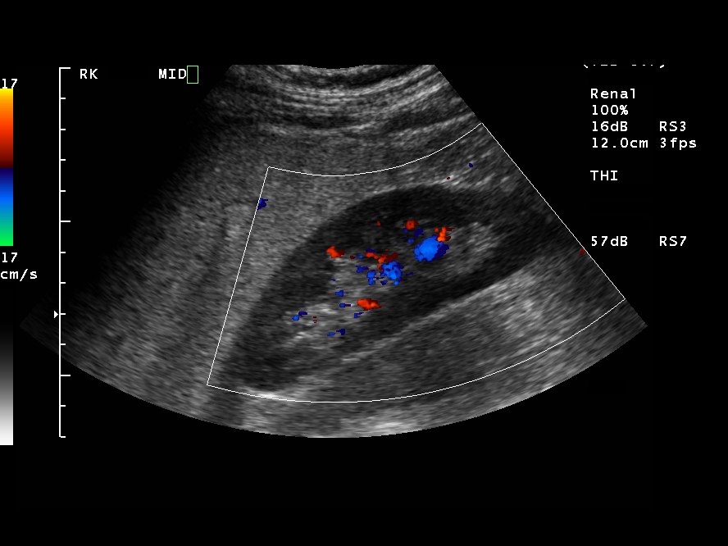
[im 7/38]
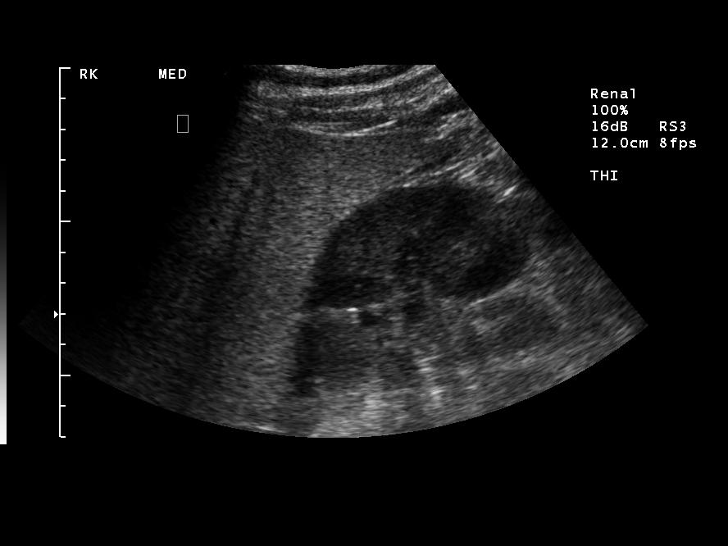
[im 10/38]
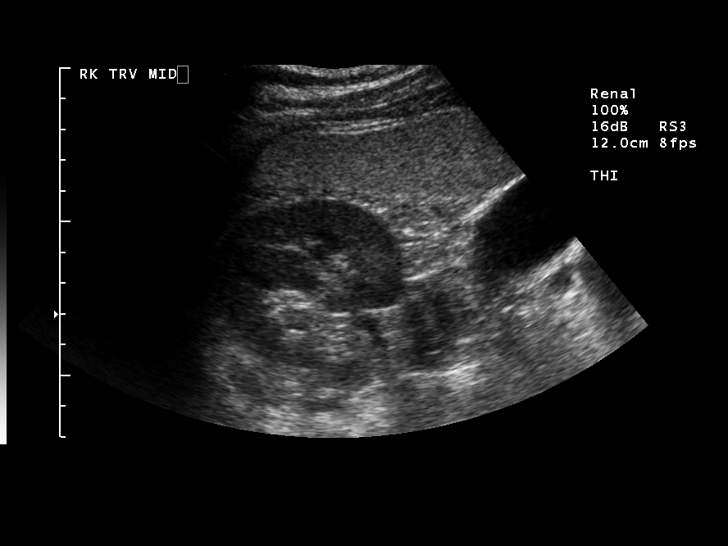
[im 13/38]
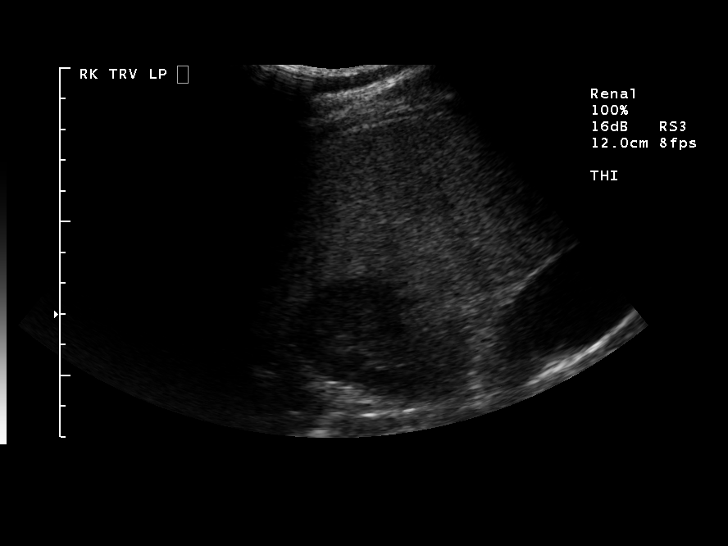
[im 14/38]
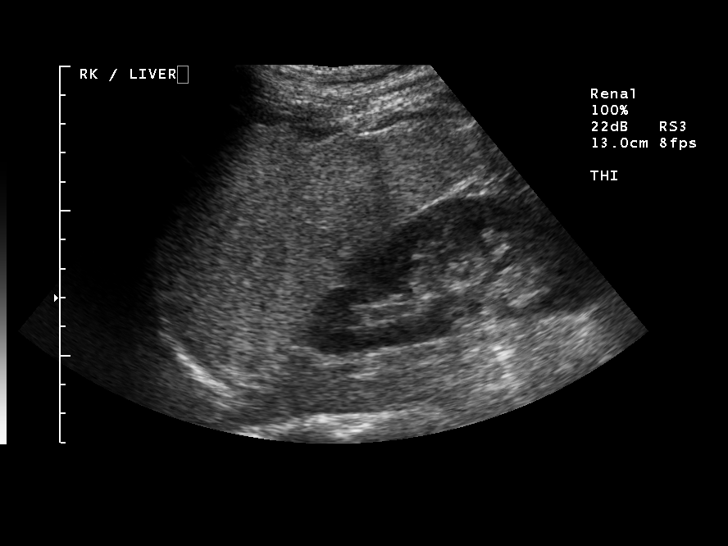
[im 17/38]
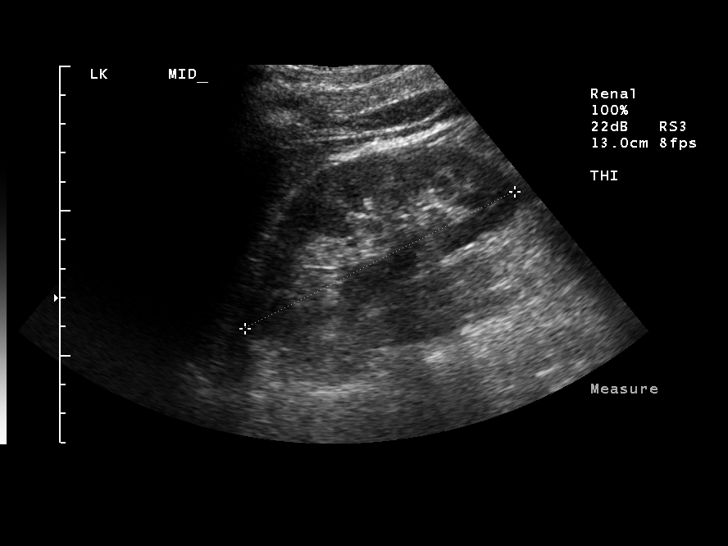
[im 21/38]
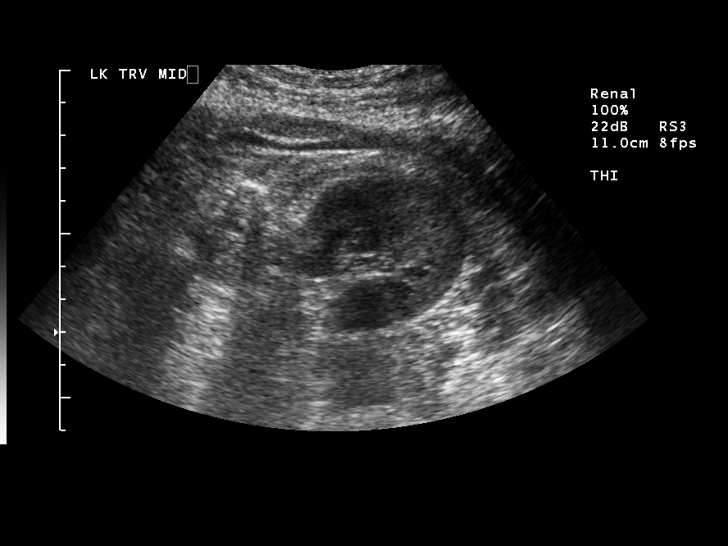
[im 24/38]
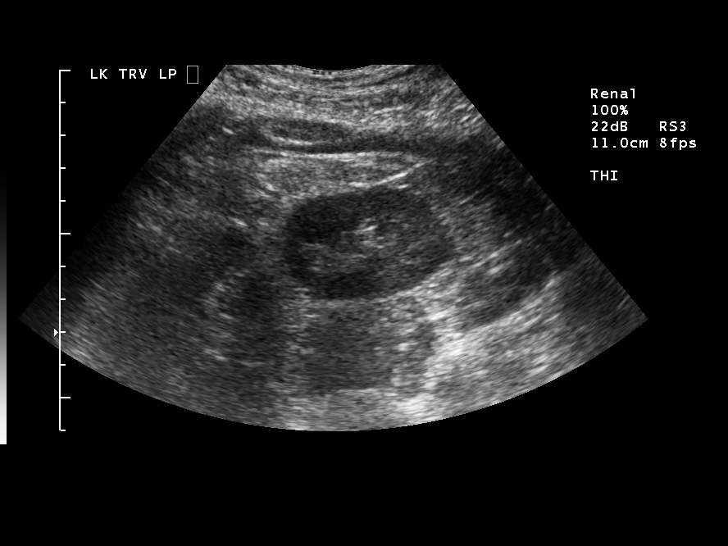
[im 25/38]
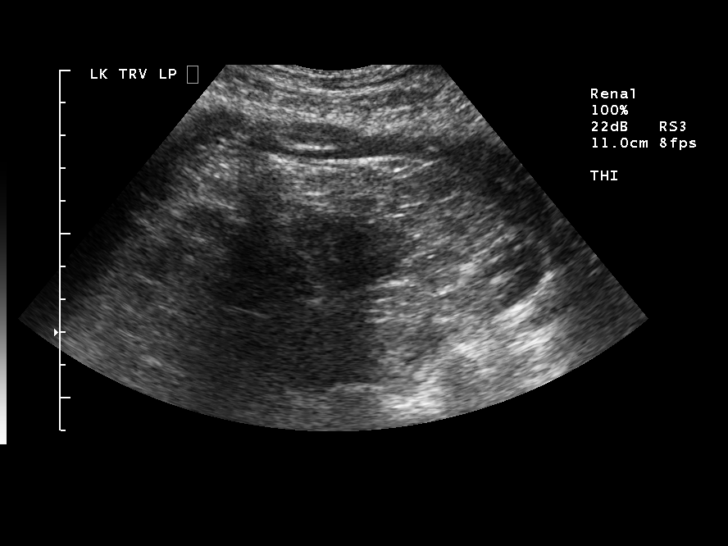
[im 28/38]
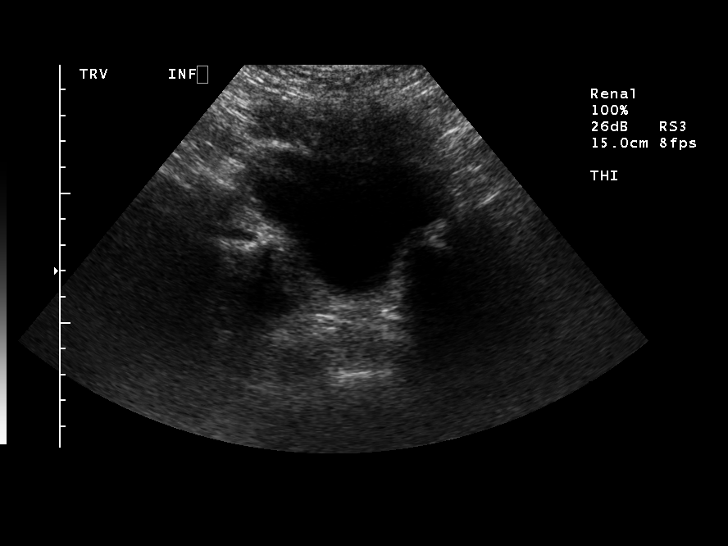
[im 31/38]
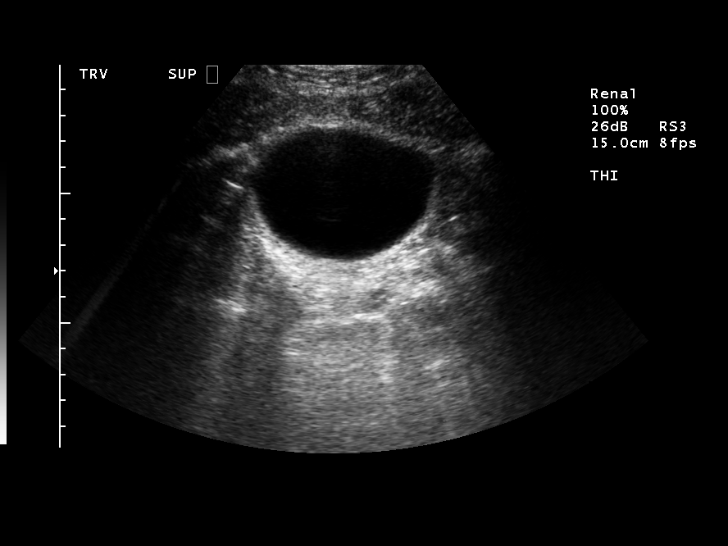
[im 34/38]
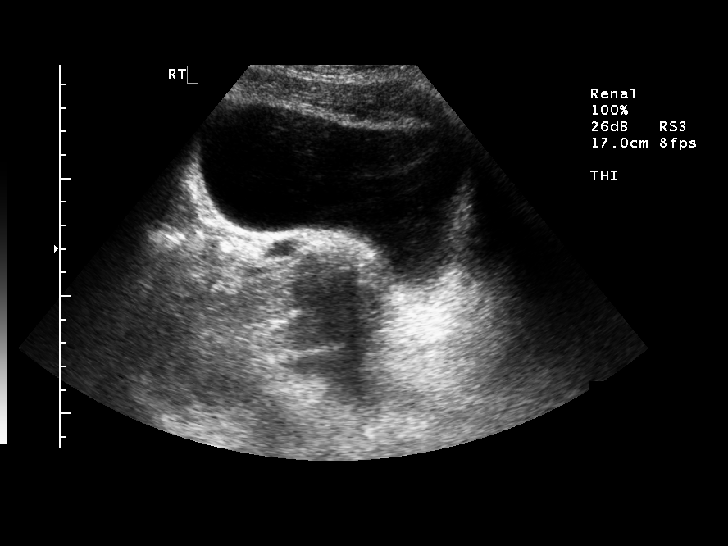
[im 38/38]
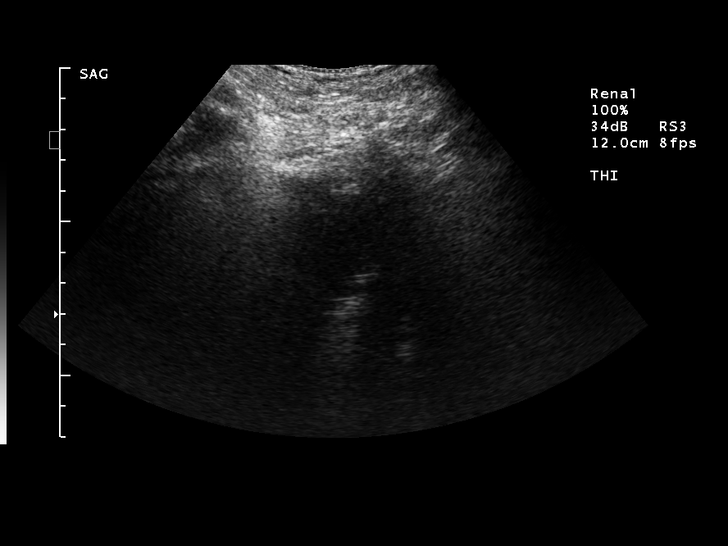

[14 of 25 positions shown; findings below may reference images not displayed]

FINDINGS: The right kidney is 10.7 and the left 10.4 cm.  I cannot with certainty see a left renal mass, as is known to be present from the earlier CT.  This lesion was 2 cm on the CT and should have been visible on ultrasound.  The fact that it is not visualized indicates that it is likely to be solid or complex cystic.  If this were a simple cyst, it would have been easily visible.  As in the report of the CT of the abdomen, I feel MR without and with contrast is warranted to further assess the left kidney.  
 No hydronephrosis.  The bladder is unremarkable.
IMPRESSION: This is an essentially normal study, despite the fact that there is a known complex cystic mass of the left kidney from CT earlier today.  See above discussion.

## 2009-10-05 IMAGING — CT CT ABDOMEN WO/W CM
1 of 4 series · 12 of 32 positions shown, 18 images · IV contrast (omnipaque)
Comparison: None.   There is correlation with a prior ultrasound of the abdomen dated 10/21/06.

CLINICAL DATA: Alcoholic hepatitis/hematuria.  
 ABDOMEN CT WITHOUT AND WITH CONTRAST ? 05/04/07:
TECHNIQUE: Multidetector CT imaging of the abdomen was performed following the standard protocol both before and during bolus administration of intravenous contrast.
 Contrast:  100 cc Omnipaque 300 IV.
TECHNIQUE: Multidetector CT imaging of the pelvis was performed following the standard protocol during bolus administration of intravenous contrast.

[Series 3: abd_pel 5.0 b40f st · axial · 0.67mm/px · z∈[+1374,+1784]mm · 12 of 98 slices shown, 18 images]
[im 8/98  soft-tissue]
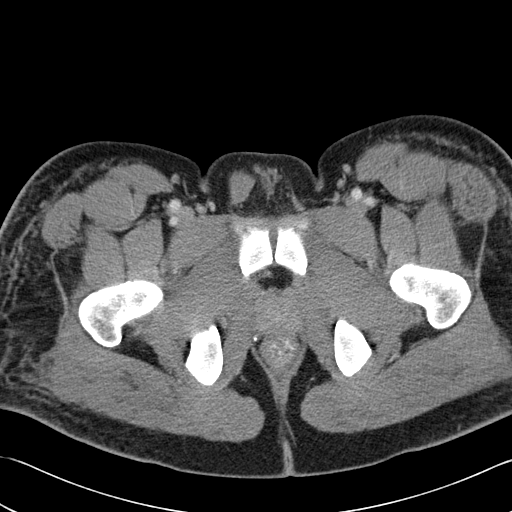
[im 8/98  bone]
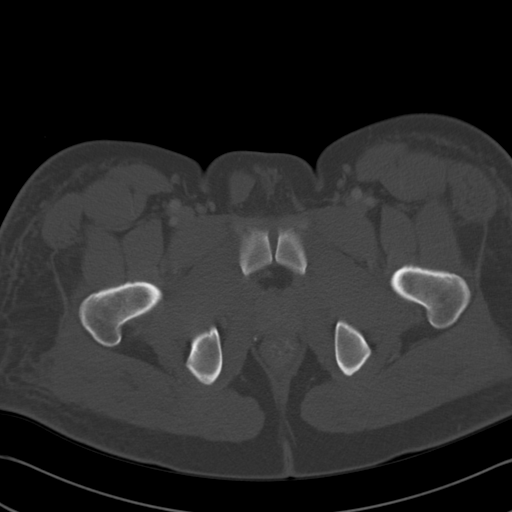
[im 15/98  soft-tissue]
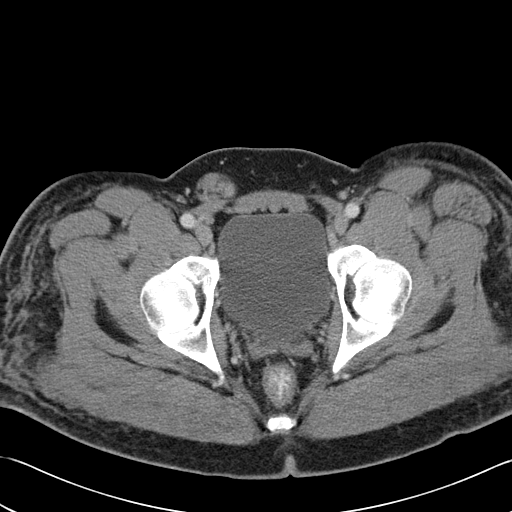
[im 23/98  soft-tissue]
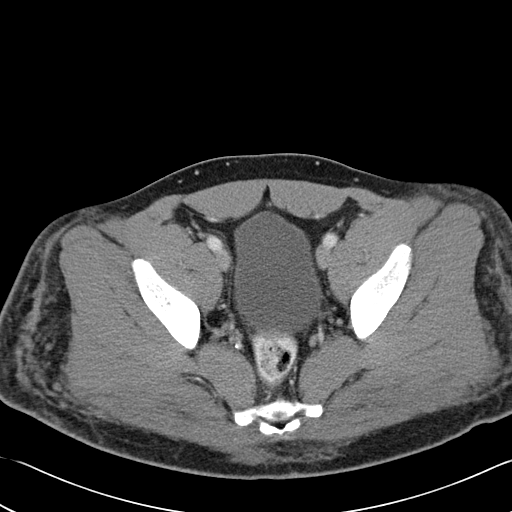
[im 30/98  soft-tissue]
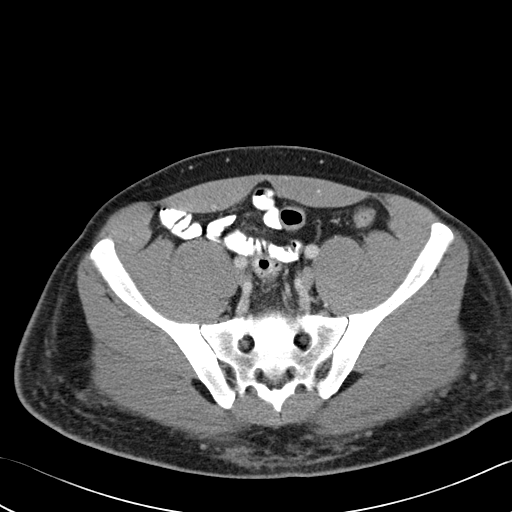
[im 38/98  soft-tissue]
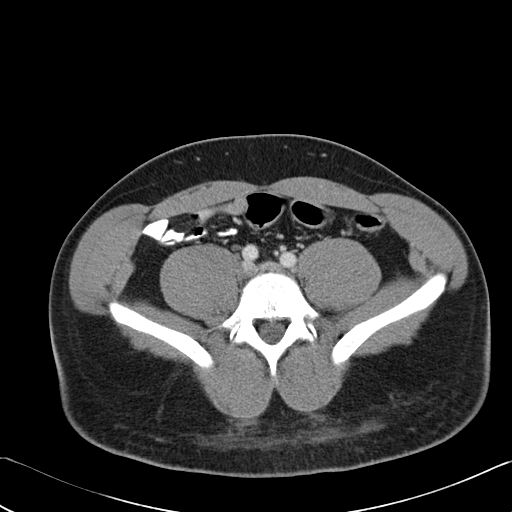
[im 45/98  soft-tissue]
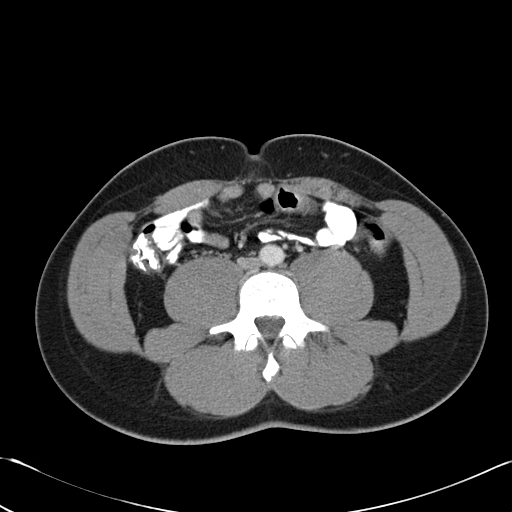
[im 53/98  soft-tissue]
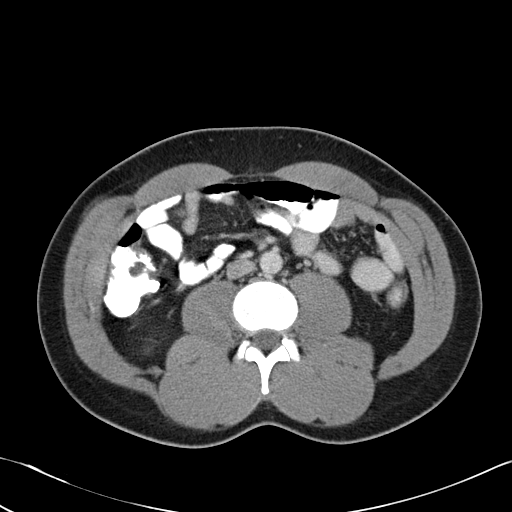
[im 60/98  soft-tissue]
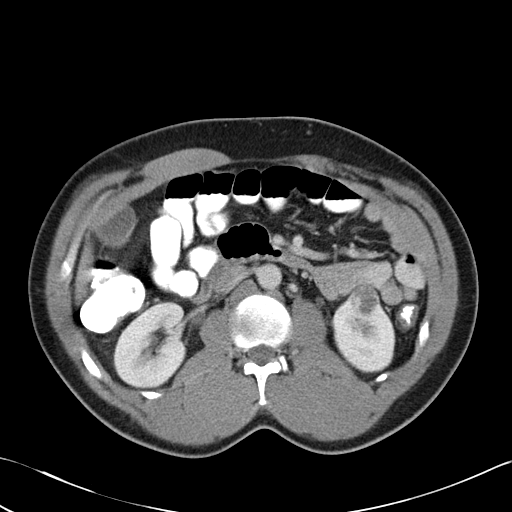
[im 68/98  soft-tissue]
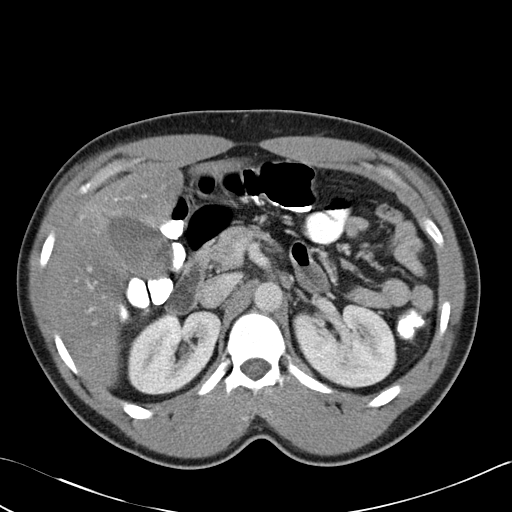
[im 68/98  lung]
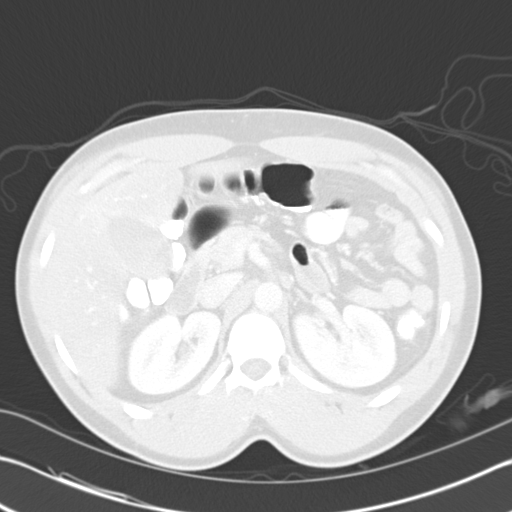
[im 68/98  bone]
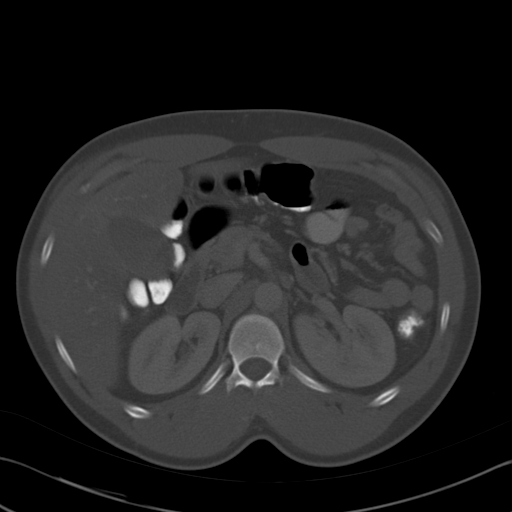
[im 75/98  soft-tissue]
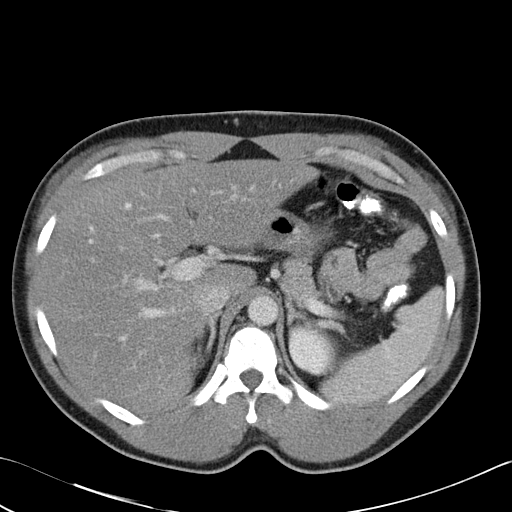
[im 75/98  lung]
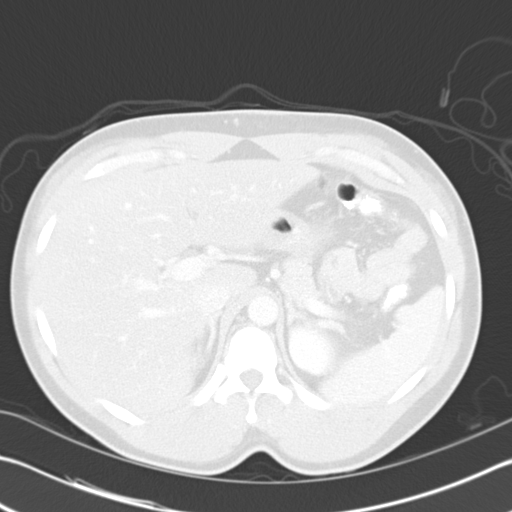
[im 83/98  soft-tissue]
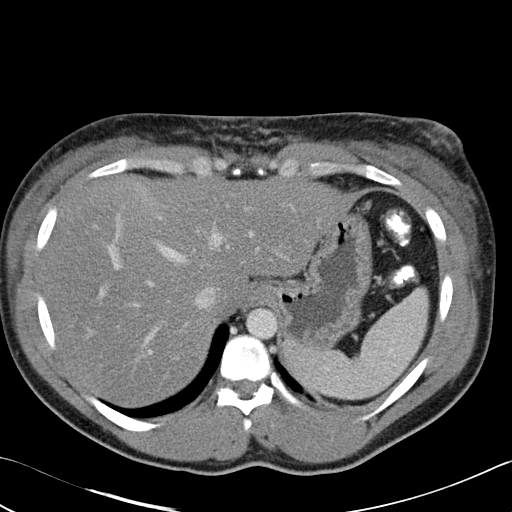
[im 83/98  lung]
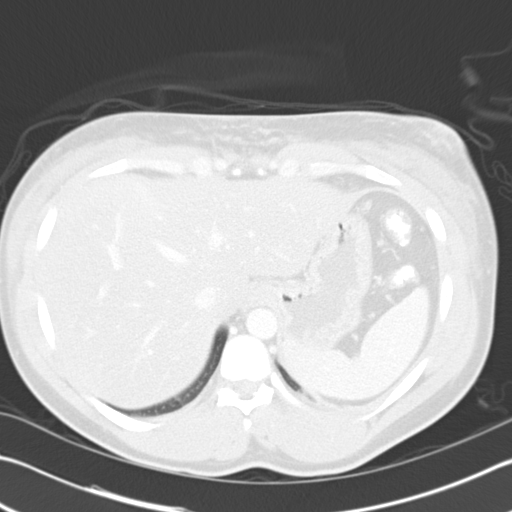
[im 90/98  soft-tissue]
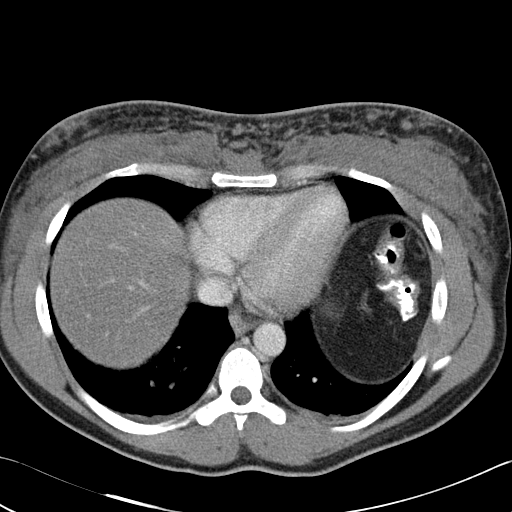
[im 90/98  lung]
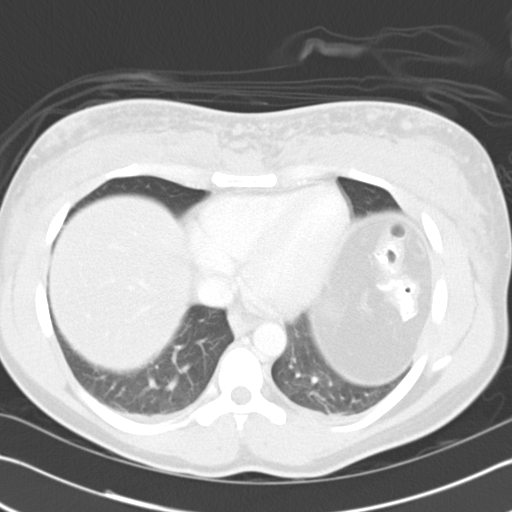

[12 of 32 positions shown; findings below may reference images not displayed]

FINDINGS: The highest cuts include the lung bases which are clear. There either posterior pleural thickening or small bilateral pleural effusions.
 The liver parenchyma is of diffuse low attenuation consistent with fatty infiltration. There are no focal lesions of the liver. The spleen is within normal limits.  There are no calcified gallstones. The pancreas and adrenal glands are unremarkable.  
 There is a 2 cm cystic lesion involving the anterior cortex of the lower left kidney.  In some areas this looks like a simple cyst.  However, the anteromedial aspect of this lesion shows nodularity. This is therefore not a simple cyst. Given the history of hematuria, one would have to be concerned about the possibility of cystic neoplasm.  
 No adenopathy or ascites.  
 I would recommend renal MRI without and with contrast, for further assessment.
 The portal vein is patent.  There are no obvious varices.
IMPRESSION: 1.  Diffuse fatty infiltration of the liver.
 2.  2 cm lesion of the anterior aspect of the lower left kidney. Although some features suggest that this is a cyst, it is not a simple cyst based on some thickening of the anteromedial wall with nodularity.  Cystic neoplasm cannot be excluded.  MRI is recommended for further assessment.  See report.   
 3.  No other findings of significance.
 PELVIS CT WITH CONTRAST ? 05/04/07:
FINDINGS: No focal masses, adenopathy, or fluid collections. There is a 2.6 x 2.1 cm density in the right inguinal canal with Hounsfield units in the 20s.  This is either an undescended testicle or an inguinal hernia containing omentum or fluid.  This needs clinical correlation.
 Osseous structures are intact.
IMPRESSION: Unremarkable except for a possible right undescended testicle versus inguinal hernia ? See report.

## 2009-10-14 ENCOUNTER — Telehealth: Payer: Self-pay | Admitting: Physician Assistant

## 2009-10-17 DIAGNOSIS — I739 Peripheral vascular disease, unspecified: Secondary | ICD-10-CM

## 2009-10-17 HISTORY — DX: Peripheral vascular disease, unspecified: I73.9

## 2009-10-22 ENCOUNTER — Ambulatory Visit: Payer: Self-pay | Admitting: Internal Medicine

## 2009-10-22 DIAGNOSIS — B370 Candidal stomatitis: Secondary | ICD-10-CM

## 2009-10-23 ENCOUNTER — Encounter (INDEPENDENT_AMBULATORY_CARE_PROVIDER_SITE_OTHER): Payer: Self-pay | Admitting: Internal Medicine

## 2009-10-23 ENCOUNTER — Ambulatory Visit: Payer: Self-pay | Admitting: Vascular Surgery

## 2009-10-23 ENCOUNTER — Emergency Department (HOSPITAL_COMMUNITY): Admission: EM | Admit: 2009-10-23 | Discharge: 2009-10-23 | Payer: Self-pay | Admitting: Emergency Medicine

## 2009-10-24 ENCOUNTER — Ambulatory Visit: Payer: Self-pay | Admitting: Vascular Surgery

## 2009-10-24 ENCOUNTER — Telehealth (INDEPENDENT_AMBULATORY_CARE_PROVIDER_SITE_OTHER): Payer: Self-pay | Admitting: Internal Medicine

## 2009-10-26 IMAGING — MR MR ABDOMEN WO/W CM
7 of 12 series · 19 of 48 positions shown · IV contrast (magnevist)
Comparison: CT scan 05/04/07.

CLINICAL DATA: 26-year-old with left renal mass.  
 MRI ABDOMEN WITHOUT AND WITH CONTRAST:
TECHNIQUE: Multiplanar, multisequence MR imaging of the abdomen was performed both before and after administration of intravenous contrast.
 Contrast:  30 cc Magnevist.

[Series 2: T2 · coronal · 6.0mm · 0.78mm/px · 1 of 30 slices shown (1 of 2)]
[im 1/30]
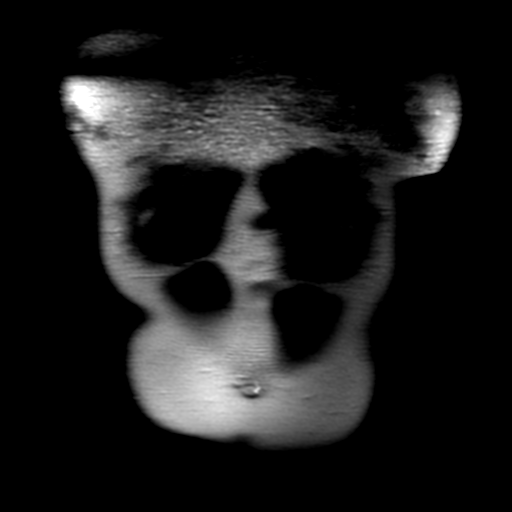

[Series 3: T2 fat-sat · axial · 5.0mm · 0.74mm/px · 1 of 36 slices shown]
[im 1/36]
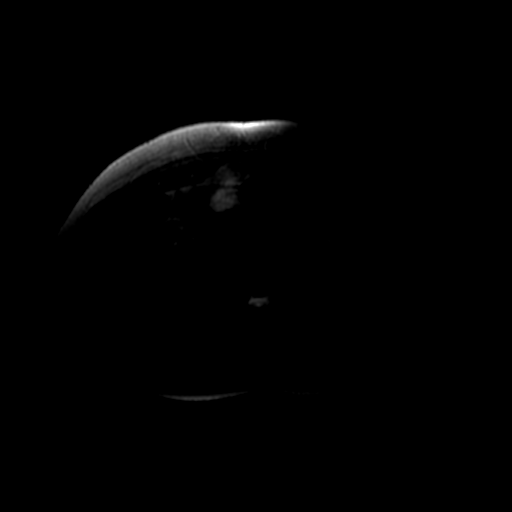

[Series 4: T2 · axial · 6.0mm · 0.70mm/px · 1 of 36 slices shown (2 of 2)]
[im 1/36]
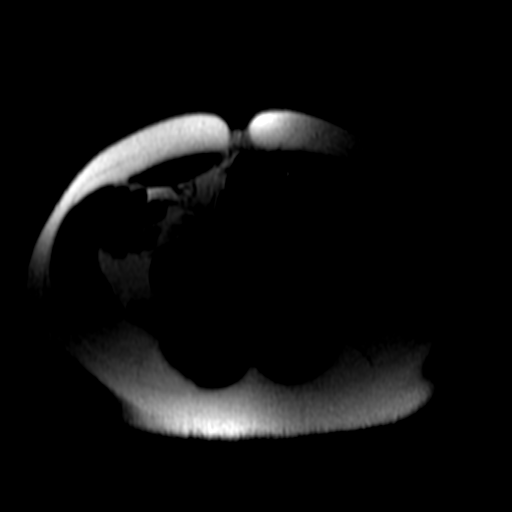

[Series 5: T1 · axial · 6.0mm · 0.74mm/px · z∈[-64,+97]mm · 2 of 48 slices shown]
[im 1/48]
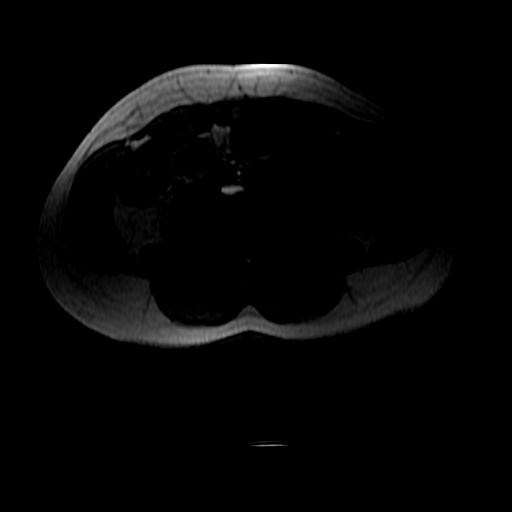
[im 48/48]
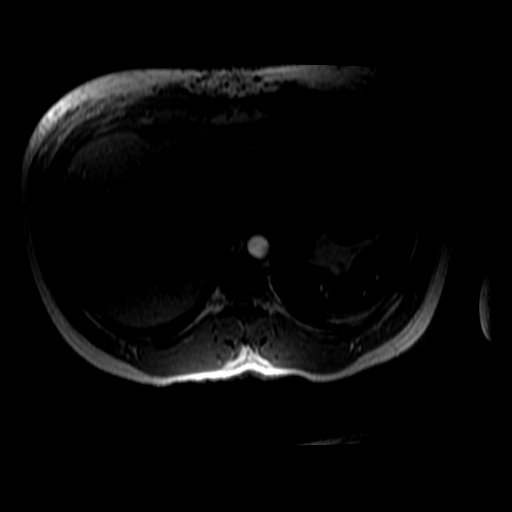

[Series 6: pre dynamic · axial · non-contrast · 3.0mm · 0.82mm/px · z∈[-88,+108]mm · 6 of 132 slices shown]
[im 1/132]
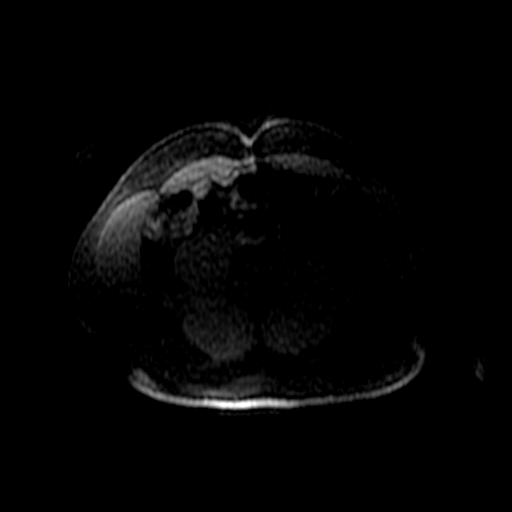
[im 27/132]
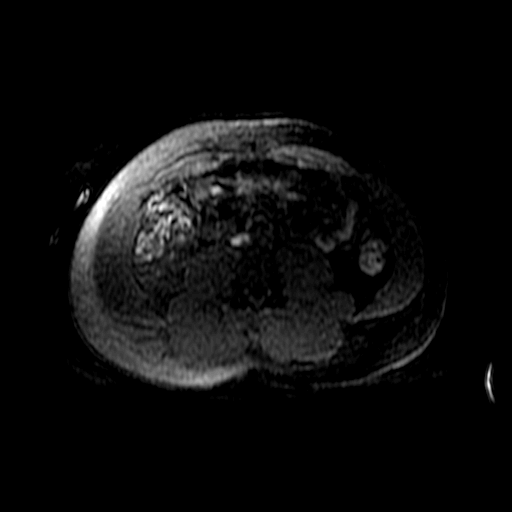
[im 53/132]
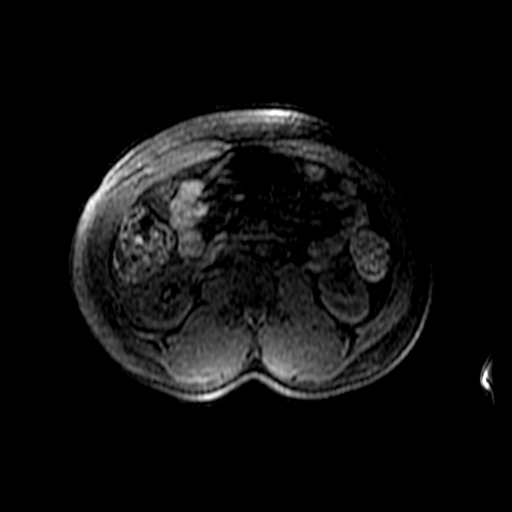
[im 79/132]
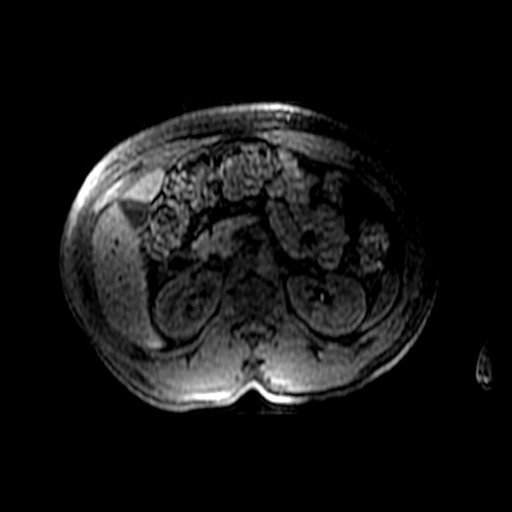
[im 105/132]
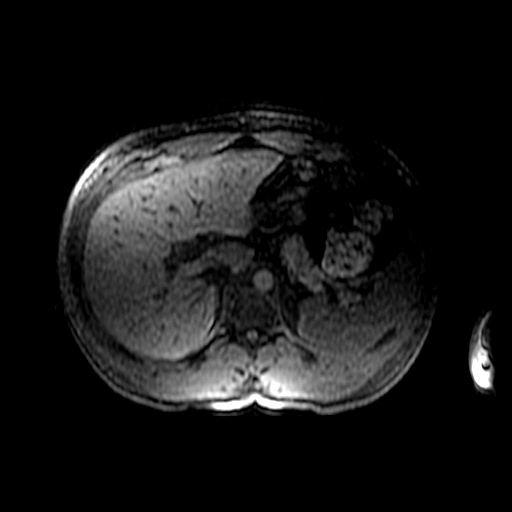
[im 132/132]
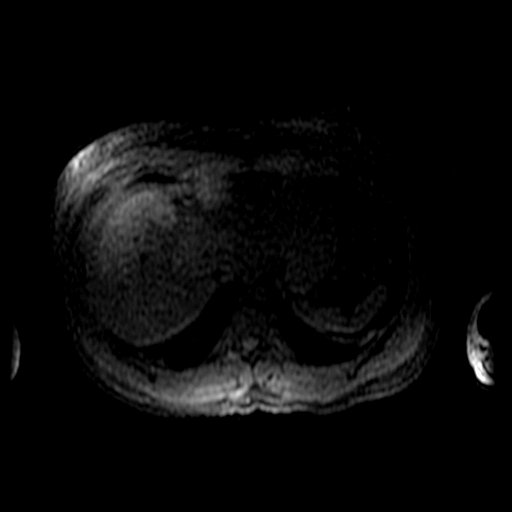

[Series 7: immed (id) dynamic · axial · 3.0mm · 0.82mm/px · z∈[-88,+108]mm · 6 of 132 slices shown]
[im 1/132]
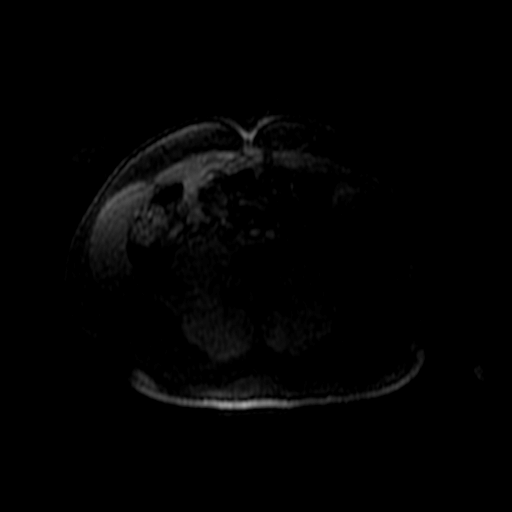
[im 27/132]
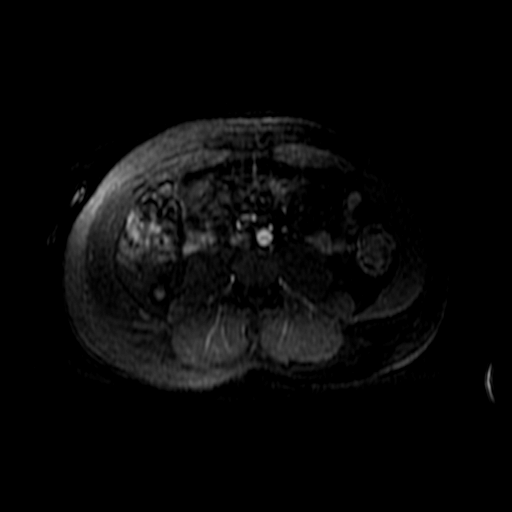
[im 53/132]
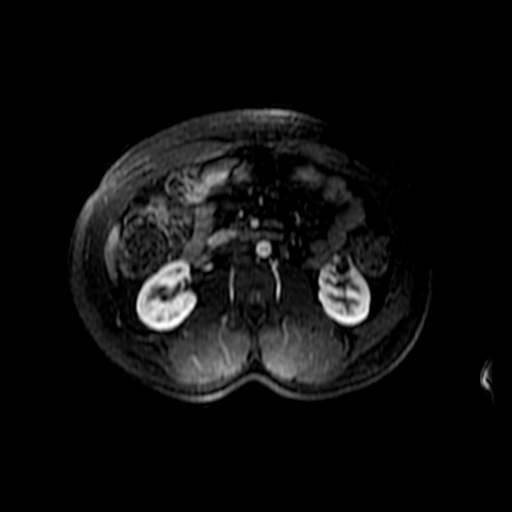
[im 79/132]
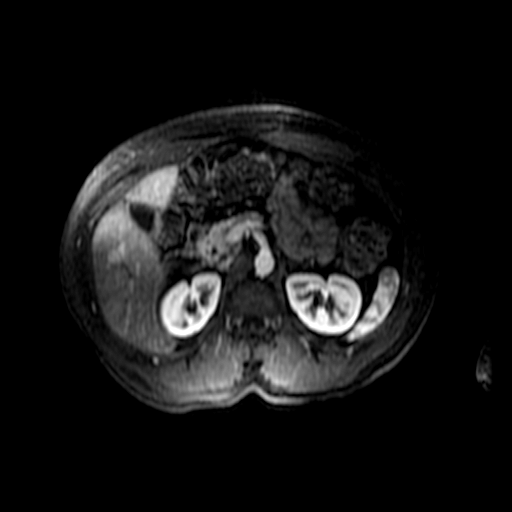
[im 105/132]
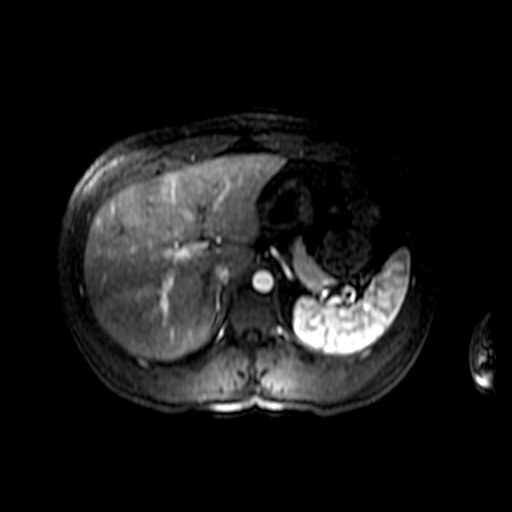
[im 132/132]
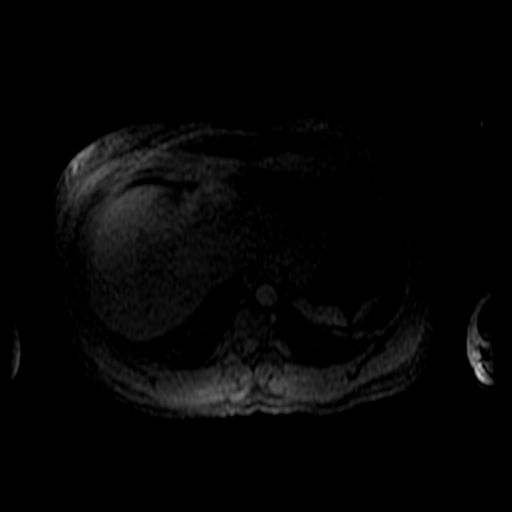

[Series 8: 45 sec (id) · axial · 3.0mm · 0.82mm/px · z∈[-88,-49]mm · 2 of 132 slices shown]
[im 1/132]
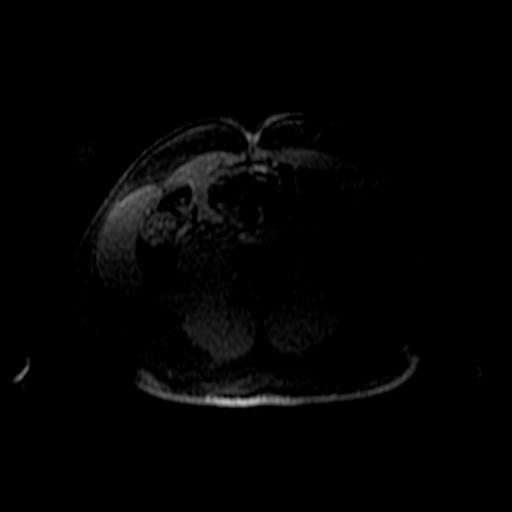
[im 27/132]
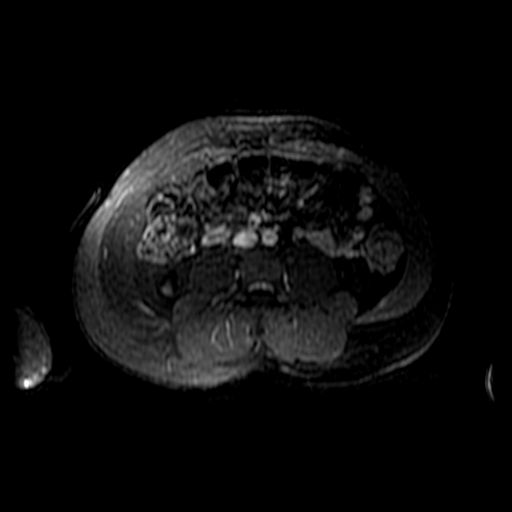

[19 of 48 positions shown; findings below may reference images not displayed]

FINDINGS: No significant abnormalities involving the liver or spleen.  The pancreas is unremarkable.  The adrenal glands demonstrate no significant findings.  There is a 2.1 x 1.8 cm lesion projecting off the lower pole region of the left kidney anteriorly.  This is largely a cystic lesion but does have septations.  There are clearly areas of septal and somewhat nodular enhancement involving this lesion and it is calcified as a Bosniak type 3 lesion and needs consideration for biopsy or excision despite the patient?s young age.  
 The aorta is normal in caliber.  Major branch vessels are normal.  The left renal vein is normal.  No mesenteric or retroperitoneal adenopathy.  No significant bony findings.
IMPRESSION: 1.  2 cm left renal lesion has MR imaging features, which are somewhat worrisome.  This is classified as a Bosniak 3 lesion and needs consideration for biopsy or excision.  
 2.  The remainder of the abdomen is unremarkable.

## 2009-10-27 ENCOUNTER — Encounter (INDEPENDENT_AMBULATORY_CARE_PROVIDER_SITE_OTHER): Payer: Self-pay | Admitting: Internal Medicine

## 2009-10-29 ENCOUNTER — Telehealth (INDEPENDENT_AMBULATORY_CARE_PROVIDER_SITE_OTHER): Payer: Self-pay | Admitting: Internal Medicine

## 2009-11-04 ENCOUNTER — Inpatient Hospital Stay (HOSPITAL_COMMUNITY): Admission: RE | Admit: 2009-11-04 | Discharge: 2009-11-06 | Payer: Self-pay | Admitting: Vascular Surgery

## 2009-11-06 ENCOUNTER — Ambulatory Visit: Payer: Self-pay | Admitting: Surgery

## 2009-11-06 ENCOUNTER — Encounter: Payer: Self-pay | Admitting: Vascular Surgery

## 2009-11-11 ENCOUNTER — Telehealth (INDEPENDENT_AMBULATORY_CARE_PROVIDER_SITE_OTHER): Payer: Self-pay | Admitting: Internal Medicine

## 2009-11-11 ENCOUNTER — Ambulatory Visit: Payer: Self-pay | Admitting: Internal Medicine

## 2009-11-11 DIAGNOSIS — K089 Disorder of teeth and supporting structures, unspecified: Secondary | ICD-10-CM | POA: Insufficient documentation

## 2009-12-04 ENCOUNTER — Ambulatory Visit: Payer: Self-pay | Admitting: Internal Medicine

## 2009-12-12 ENCOUNTER — Encounter: Payer: Self-pay | Admitting: Internal Medicine

## 2009-12-12 ENCOUNTER — Ambulatory Visit: Payer: Self-pay | Admitting: Vascular Surgery

## 2009-12-22 ENCOUNTER — Emergency Department (HOSPITAL_COMMUNITY): Admission: EM | Admit: 2009-12-22 | Discharge: 2009-12-25 | Payer: Self-pay | Admitting: Family Medicine

## 2009-12-23 DIAGNOSIS — F1020 Alcohol dependence, uncomplicated: Secondary | ICD-10-CM

## 2009-12-24 DIAGNOSIS — F1020 Alcohol dependence, uncomplicated: Secondary | ICD-10-CM

## 2009-12-25 DIAGNOSIS — F1020 Alcohol dependence, uncomplicated: Secondary | ICD-10-CM

## 2010-01-27 ENCOUNTER — Encounter (INDEPENDENT_AMBULATORY_CARE_PROVIDER_SITE_OTHER): Payer: Self-pay | Admitting: Internal Medicine

## 2010-03-12 ENCOUNTER — Ambulatory Visit: Payer: Self-pay | Admitting: Vascular Surgery

## 2010-03-13 ENCOUNTER — Ambulatory Visit: Payer: Self-pay | Admitting: Internal Medicine

## 2010-03-13 LAB — CONVERTED CEMR LAB
AST: 30 units/L (ref 0–37)
BUN: 11 mg/dL (ref 6–23)
Basophils Relative: 1 % (ref 0–1)
Calcium: 9.3 mg/dL (ref 8.4–10.5)
Chloride: 102 meq/L (ref 96–112)
Creatinine, Ser: 1.04 mg/dL (ref 0.40–1.50)
Eosinophils Absolute: 0.1 10*3/uL (ref 0.0–0.7)
Glucose, Bld: 83 mg/dL (ref 70–99)
HDL: 57 mg/dL (ref >39)
Hemoglobin: 14.9 g/dL (ref 13.0–17.0)
Lymphs Abs: 1.8 10*3/uL (ref 0.7–4.0)
MCHC: 33 g/dL (ref 30.0–36.0)
MCV: 86.7 fL (ref 78.0–100.0)
Monocytes Absolute: 0.2 10*3/uL (ref 0.1–1.0)
Monocytes Relative: 7 % (ref 3–12)
Neutro Abs: 1 10*3/uL — ABNORMAL LOW (ref 1.7–7.7)
Phenytoin Lvl: 14.7 ug/mL (ref 10.0–20.0)
RBC: 5.2 M/uL (ref 4.22–5.81)
Total CHOL/HDL Ratio: 5.6
Triglycerides: 102 mg/dL (ref <150)

## 2010-03-19 ENCOUNTER — Encounter (INDEPENDENT_AMBULATORY_CARE_PROVIDER_SITE_OTHER): Payer: Self-pay | Admitting: Internal Medicine

## 2010-03-20 ENCOUNTER — Emergency Department (HOSPITAL_COMMUNITY): Admission: EM | Admit: 2010-03-20 | Discharge: 2010-03-20 | Payer: Self-pay | Admitting: Emergency Medicine

## 2010-04-06 ENCOUNTER — Ambulatory Visit: Payer: Self-pay | Admitting: Internal Medicine

## 2010-04-08 ENCOUNTER — Ambulatory Visit: Payer: Self-pay | Admitting: Internal Medicine

## 2010-04-14 ENCOUNTER — Encounter (INDEPENDENT_AMBULATORY_CARE_PROVIDER_SITE_OTHER): Payer: Self-pay | Admitting: Internal Medicine

## 2010-04-26 ENCOUNTER — Emergency Department (HOSPITAL_COMMUNITY): Admission: EM | Admit: 2010-04-26 | Discharge: 2010-04-27 | Payer: Self-pay | Admitting: Emergency Medicine

## 2010-04-27 ENCOUNTER — Inpatient Hospital Stay (HOSPITAL_COMMUNITY): Admission: AD | Admit: 2010-04-27 | Discharge: 2010-04-27 | Payer: Self-pay | Admitting: Psychiatry

## 2010-04-27 ENCOUNTER — Ambulatory Visit: Payer: Self-pay | Admitting: Psychiatry

## 2010-04-27 ENCOUNTER — Observation Stay (HOSPITAL_COMMUNITY): Admission: EM | Admit: 2010-04-27 | Discharge: 2010-05-01 | Payer: Self-pay | Admitting: Emergency Medicine

## 2010-05-01 ENCOUNTER — Inpatient Hospital Stay (HOSPITAL_COMMUNITY): Admission: AD | Admit: 2010-05-01 | Discharge: 2010-05-04 | Payer: Self-pay | Admitting: Psychiatry

## 2010-05-22 ENCOUNTER — Ambulatory Visit: Payer: Self-pay | Admitting: Internal Medicine

## 2010-05-22 ENCOUNTER — Telehealth (INDEPENDENT_AMBULATORY_CARE_PROVIDER_SITE_OTHER): Payer: Self-pay | Admitting: Internal Medicine

## 2010-05-23 LAB — CONVERTED CEMR LAB
ALT: 20 units/L (ref 0–53)
AST: 22 units/L (ref 0–37)
Albumin: 4.4 g/dL (ref 3.5–5.2)
Alkaline Phosphatase: 52 units/L (ref 39–117)
Indirect Bilirubin: 0.2 mg/dL (ref 0.0–0.9)
Total Protein: 6.9 g/dL (ref 6.0–8.3)

## 2010-06-13 ENCOUNTER — Ambulatory Visit: Payer: Self-pay | Admitting: Psychiatry

## 2010-06-30 ENCOUNTER — Encounter (INDEPENDENT_AMBULATORY_CARE_PROVIDER_SITE_OTHER): Payer: Self-pay | Admitting: Internal Medicine

## 2010-06-30 ENCOUNTER — Ambulatory Visit: Payer: Self-pay | Admitting: Vascular Surgery

## 2010-07-07 ENCOUNTER — Emergency Department (HOSPITAL_COMMUNITY)
Admission: EM | Admit: 2010-07-07 | Discharge: 2010-07-07 | Payer: Self-pay | Source: Home / Self Care | Admitting: Emergency Medicine

## 2010-07-17 ENCOUNTER — Ambulatory Visit: Payer: Self-pay | Admitting: Internal Medicine

## 2010-07-22 ENCOUNTER — Ambulatory Visit: Admit: 2010-07-22 | Payer: Self-pay | Admitting: Internal Medicine

## 2010-07-22 ENCOUNTER — Emergency Department (HOSPITAL_COMMUNITY)
Admission: EM | Admit: 2010-07-22 | Discharge: 2010-07-22 | Payer: No Typology Code available for payment source | Source: Home / Self Care | Admitting: Emergency Medicine

## 2010-07-22 LAB — CBC
HCT: 48.2 % (ref 39.0–52.0)
Hemoglobin: 17.2 g/dL — ABNORMAL HIGH (ref 13.0–17.0)
MCH: 30 pg (ref 26.0–34.0)
MCHC: 35.7 g/dL (ref 30.0–36.0)
MCV: 84 fL (ref 78.0–100.0)
Platelets: 151 10*3/uL (ref 150–400)
RBC: 5.74 MIL/uL (ref 4.22–5.81)
RDW: 14.7 % (ref 11.5–15.5)
WBC: 5.7 10*3/uL (ref 4.0–10.5)

## 2010-07-22 LAB — URINALYSIS, ROUTINE W REFLEX MICROSCOPIC
Bilirubin Urine: NEGATIVE
Leukocytes, UA: NEGATIVE
Nitrite: NEGATIVE
Protein, ur: 100 mg/dL — AB
Specific Gravity, Urine: 1.027 (ref 1.005–1.030)
Urine Glucose, Fasting: NEGATIVE mg/dL
Urobilinogen, UA: 0.2 mg/dL (ref 0.0–1.0)
pH: 6 (ref 5.0–8.0)

## 2010-07-22 LAB — COMPREHENSIVE METABOLIC PANEL
ALT: 37 U/L (ref 0–53)
AST: 46 U/L — ABNORMAL HIGH (ref 0–37)
Albumin: 4.5 g/dL (ref 3.5–5.2)
Alkaline Phosphatase: 67 U/L (ref 39–117)
BUN: 8 mg/dL (ref 6–23)
CO2: 20 mEq/L (ref 19–32)
Calcium: 9.5 mg/dL (ref 8.4–10.5)
Chloride: 102 mEq/L (ref 96–112)
Creatinine, Ser: 0.88 mg/dL (ref 0.4–1.5)
GFR calc Af Amer: >60 mL/min (ref >60)
GFR calc non Af Amer: >60 mL/min (ref >60)
Glucose, Bld: 78 mg/dL (ref 70–99)
Potassium: 4.2 mEq/L (ref 3.5–5.1)
Sodium: 140 mEq/L (ref 135–145)
Total Bilirubin: 0.8 mg/dL (ref 0.3–1.2)
Total Protein: 8 g/dL (ref 6.0–8.3)

## 2010-07-22 LAB — DIFFERENTIAL
Basophils Absolute: 0 10*3/uL (ref 0.0–0.1)
Basophils Relative: 0 % (ref 0–1)
Eosinophils Absolute: 0 10*3/uL (ref 0.0–0.7)
Eosinophils Relative: 0 % (ref 0–5)
Lymphocytes Relative: 24 % (ref 12–46)
Lymphs Abs: 1.4 10*3/uL (ref 0.7–4.0)
Monocytes Absolute: 0.5 10*3/uL (ref 0.1–1.0)
Monocytes Relative: 8 % (ref 3–12)
Neutro Abs: 3.8 10*3/uL (ref 1.7–7.7)
Neutrophils Relative %: 67 % (ref 43–77)

## 2010-07-22 LAB — RAPID URINE DRUG SCREEN, HOSP PERFORMED
Amphetamines: NOT DETECTED
Barbiturates: NOT DETECTED
Benzodiazepines: NOT DETECTED
Cocaine: NOT DETECTED
Opiates: NOT DETECTED
Tetrahydrocannabinol: NOT DETECTED

## 2010-07-22 LAB — ETHANOL: Alcohol, Ethyl (B): 25 mg/dL — ABNORMAL HIGH (ref 0–10)

## 2010-07-22 LAB — URINE MICROSCOPIC-ADD ON

## 2010-07-22 LAB — LIPASE, BLOOD: Lipase: 22 U/L (ref 11–59)

## 2010-08-18 NOTE — Assessment & Plan Note (Signed)
Summary: FU WITH DR Ambrie Carte IN 2 WEEKS//GK   Vital Signs:  Patient profile:   30 year old male Weight:      162.38 pounds BMI:     27.12 Temp:     97.6 degrees F Pulse rate:   105 / minute Pulse rhythm:   regular Resp:     14 per minute BP sitting:   123 / 86  (left arm) Cuff size:   regular  Vitals Entered By: Chauncy Passy, SMA CC: Pt. is herre for a 2 week f/u. Pt did have surgery last week @ [facility] and is in pain.  Is Patient Diabetic? No Pain Assessment Patient in pain? yes     Location: Left Leg Intensity: 5 Type: aching Onset of pain  Intermittent  Does patient need assistance? Functional Status Self care Ambulation Normal   CC:  Pt. is herre for a 2 week f/u. Pt did have surgery last week @ [facility] and is in pain. Marland Kitchen  History of Present Illness: 1.  Right renal mass:  felt to be a benign cyst.  Continues to be followed by Dr. Aldean Ast.    2.  Left popliteal artery injury and thrombus:  Felt to be an injury as inciting cause with his fall from the porch.  Did have bypass on 4/19 with Dr.  Lorelee Cover feeling better. Some mild swelling.  Has follow up wiht him in 2 more weeks.    3.  Oral thrush:  much better with Fluconazole.  Still having pain with back teeth  on left.  Allergic to Pencillin-hives.  HIV testing was negative.    4.  Dysphagia and sternal burning:  resolved with Fluconazole treatment--14 days  Current Medications (verified): 1)  Crestor 20 Mg  Tabs (Rosuvastatin Calcium) .... 2 Tabs By Mouth Daily 2)  Atenolol 25 Mg Tabs (Atenolol) .Marland Kitchen.. 1 Tab By Mouth Daily 3)  Dilantin 100 Mg Caps (Phenytoin Sodium Extended) .... 2 By Mouth Two Times A Day 4)  Trazodone Hcl 50 Mg Tabs (Trazodone Hcl) .Marland Kitchen.. 1 By Mouth At Bedtime 5)  Abilify 5 Mg Tabs (Aripiprazole) .... 2 Tabs By Mouth Every Morning 6)  Neurontin 300 Mg Caps (Gabapentin) .Marland Kitchen.. 1 Cap Four Times Daily 7)  Benztropine Mesylate 1 Mg Tabs (Benztropine Mesylate) .Marland Kitchen.. 1 Tab At Bedtime As Needed For Teeth Grinding  and Stiffness 8)  Celexa 20 Mg Tabs (Citalopram Hydrobromide) .Marland Kitchen.. 1 By Mouth Once Daily 9)  Fluconazole 100 Mg Tabs (Fluconazole) .... 2 Tabs By Mouth Today, Then 1 Tab By Mouth Daily For 14 Days.  Allergies (verified): 1)  ! Pcn 2)  ! Morphine  Physical Exam  General:  NAD Mouth:  Multiple broken teeth--very tender with swelling of surrounding gums and generalized periodontal gingival swelling.  White adherent material is now gone--much less inflammed in mouth, palate in general Lungs:  Normal respiratory effort, chest expands symmetrically. Lungs are clear to auscultation, no crackles or wheezes. Heart:  Normal rate and regular rhythm. S1 and S2 normal without gallop, murmur, click, rub or other extra sounds.  Radial pulses normal and equal. Extremities:  Mild swelling of left LE--Surgical wounds with edges well opposed, no erythema or discharge.   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 Aldean Ast following.  Problem # 4:  ACUTE LEFT POPLITEAL ARTERY THROMBUS (ICD-904.9) Felt to be secondary to traume to artery--status post bypass--not clear if graft or vein used  Complete Medication List: 1)  Crestor 20 Mg Tabs (Rosuvastatin calcium) .... 2 tabs by mouth daily 2)  Atenolol 25 Mg Tabs (Atenolol) .Marland Kitchen.. 1 tab by mouth daily 3)  Dilantin 100 Mg Caps (Phenytoin sodium extended) .... 2 by mouth two times a day 4)  Trazodone Hcl 50 Mg Tabs (Trazodone hcl) .Marland Kitchen.. 1 by mouth at bedtime 5)  Abilify 5 Mg Tabs (Aripiprazole) .... 2 tabs by mouth every morning 6)  Neurontin 300 Mg Caps (Gabapentin) .Marland Kitchen.. 1 cap four times daily 7)  Benztropine Mesylate 1 Mg Tabs (Benztropine mesylate) .Marland Kitchen..  1 tab at bedtime as needed for teeth grinding and stiffness 8)  Celexa 20 Mg Tabs (Citalopram hydrobromide) .Marland Kitchen.. 1 by mouth once daily 9)  Cleocin 300 Mg Caps (Clindamycin hcl) .Marland Kitchen.. 1 cap by mouth three times a day  Patient Instructions: 1)  Follow up with Dr. Delrae Alfred in 4 months  Prescriptions: CLEOCIN 300 MG CAPS (CLINDAMYCIN HCL) 1 cap by mouth three times a day  #21 x 0   Entered and Authorized by:   Julieanne Manson MD   Signed by:   Julieanne Manson MD on 11/11/2009   Method used:   Faxed to ...       [facility] (retail)       [address] [place], [place]  [address]       Ph: [phone]       Fax: [phone]   RxID:   [id number]

## 2010-08-18 NOTE — Progress Notes (Signed)
Summary: Call From Vascular  Phone Note Outgoing Call   Summary of Call: Spoke with [facility] from Vascular on 10/23/2009 (late afternoon)- He was sent for Lower extremity venous duplex (ordered by Dr. Delrae Alfred) to r/o DVT. Pt instead has an arterial thrombus - popliteal Instructed pt to go to the ER for immediate evaluation by vascular Full report of scan to be sent in EMR Initial call taken by: Lehman Prom FNP,  October 24, 2009 8:14 AM

## 2010-08-18 NOTE — Miscellaneous (Signed)
Summary: Urology update--left kidney lesion/Dr. Aldean Ast  Clinical Lists Changes  Problems: Changed problem from NEOPLASM, KIDNEY (ICD-239.5) to NEOPLASM, KIDNEY (ICD-239.5) - Followe by Dr. Aldean Ast, urology--felt to be cyst on left kidney.  renal ultrasound per his office q 6 month

## 2010-08-18 NOTE — Assessment & Plan Note (Signed)
Summary: follow up visit with Dr Berle Fitz//gk   Vital Signs:  Patient profile:   30 year old male Weight:      157.4 pounds BMI:     26.29 Temp:     98.3 degrees F oral Resp:     20 per minute BP sitting:   110 / 88  (left arm) Cuff size:   regular  Vitals Entered By: Mikey College CMA (August 14, 2009 9:04 AM) CC: pt here for f/u and states he is also thinks its time for his cholesterol check. Pain Assessment Patient in pain? no       Does patient need assistance? Functional Status Self care Ambulation Normal Comments atenolol pt taking 1/2 tab daily  trazadone taking 3 tabs at bedtime////vs med list..   CC:  pt here for f/u and states he is also thinks its time for his cholesterol check..  History of Present Illness: 1.  Pt. denies any problem with genital wart and bleeding--states went to ED on the 4th of January as he had used alcohol again and had an anxiety attack.  Now with another psych provider.  Karie Fetch, Psychiatric Nurse Practitioner through the [facility].  Was in rehab inpatient for 4 days.  Now involved with a day program.  Pt. is felt to have OCD and Bipolar Disorder.  Already feels he is doing better in past month with med changes.  Still with panic attacks and depression.  Pt. involved in classes for depression as well.   Now working 2 hours daily.  Applying for social security.    2.  Hypertension:  BP controlled.  3.  Hyperlipidemia:  Cravings to eat after Trazodone.  Only eating once daily--sometimes small healthy snacks earlier in day.  Tolerating 40 mg of Crestor daily.  Fasting today.  Allergies: 1)  ! Pcn 2)  ! Morphine  Physical Exam  General:  NAD, looks well. Lungs:  Normal respiratory effort, chest expands symmetrically. Lungs are clear to auscultation, no crackles or wheezes. Heart:  Normal rate and regular rhythm. S1 and S2 normal without gallop, murmur, click, rub or other extra sounds.  Radial pulses normal and  equal.   Impression & Recommendations:  Problem # 1:  SUBSTANCE ABUSE, MULTIPLE (ICD-305.90) Applauded efforts to stay clean. Really working on psych issues which have caused him to revert to use in past  Problem # 2:  HYPERTENSION (ICD-401.9) Controlled His updated medication list for this problem includes:    Atenolol 25 Mg Tabs (Atenolol) .Marland Kitchen... 1 tab by mouth daily  Problem # 3:  HYPERLIPIDEMIA (ICD-272.4) FLP, hepatic profile Discussed eating at least 3 meals daily--can be small, need to be healthy His updated medication list for this problem includes:    Crestor 20 Mg Tabs (Rosuvastatin calcium) .Marland Kitchen... 2 tabs by mouth daily  Complete Medication List: 1)  Crestor 20 Mg Tabs (Rosuvastatin calcium) .... 2 tabs by mouth daily 2)  Atenolol 25 Mg Tabs (Atenolol) .Marland Kitchen.. 1 tab by mouth daily 3)  Dilantin 100 Mg Caps (Phenytoin sodium extended) .... 2 by mouth two times a day 4)  Trazodone Hcl 50 Mg Tabs (Trazodone hcl) .Marland Kitchen.. 1 by mouth at bedtime 5)  Abilify 5 Mg Tabs (Aripiprazole) .... Take 1/2 tab for two weeks every morning then 1 tab every morning--should start 08/22/09 6)  Neurontin 100 Mg Caps (Gabapentin) .... Take 3 capsules three times a day 7)  Benztropine Mesylate 1 Mg Tabs (Benztropine mesylate) .Marland Kitchen.. 1 tab at bedtime as needed  for teeth grinding and stiffness  Other Orders: T-Lipid Profile ([id number]) T-Hepatitis Profile Acute ([id number])  Patient Instructions: 1)  Follow up with Dr. Delrae Alfred in 4 months --htn, cholesterol, etc  Appended Document: follow up visit with Dr Roxan Yamamoto//gk Leretha Dykes Hepatitis profile--wrong order.   Clinical Lists Changes  Orders: Added new Test order of T-Hepatic Function [phone]) - Signed

## 2010-08-18 NOTE — Progress Notes (Signed)
Summary: genital wart  Phone Note Call from Patient Call back at [facility] Phone [phone] Call back at [phone]   Summary of Call: The mother of the pt Ms. [name], called in because Mr. [name] had been bleeding a lot from his rectum  and he went on December to the Hospital because he had a bad genital wart and he needs to be referral asap for a surgery.  Please call the mother back asap.  Pt is at work so you may need to call to the mother [phone] [facility] MD Initial call taken by: Manon Hilding,  July 22, 2009 12:10 PM  Follow-up for Phone Call        Pt scheduled to come in tomorrow. Follow-up by: [name] CMA,  July 28, 2009 12:18 PM

## 2010-08-18 NOTE — Letter (Signed)
Summary: *HSN Results Follow up  [facility]  [address] [place], [place] [address]   Phone: [phone]  Fax: [phone]      09/29/2009   [name] [address] [place], [place]  [address]   Dear  Mr. [name],                            ____S.Drinkard,FNP   ____D. Gore,FNP       ____B. McPherson,MD   ____V. Rankins,MD    __xx__E. Mulberry,MD    ____N. Daphine Deutscher, FNP  ____D. Reche Dixon, MD    ____K. Philipp Deputy, MD    ____Other     This letter is to inform you that your recent test(s):  _______Pap Smear    ____xx___Lab Test     _______X-ray    _______ is within acceptable limits  _______ requires a medication change  _______ requires a follow-up lab visit  _______ requires a follow-up visit with your provider   Comments:  Please give our office a call regarding your recent lab test.  Thank you.       _________________________________________________________ If you have any questions, please contact our office                     Sincerely,  Tiffany McCoy CMA [facility]

## 2010-08-18 NOTE — Progress Notes (Signed)
  Phone Note Call from Patient Call back at Home Phone [phone]   Caller: Patient Call For: Julieanne Manson MD Reason for Call: Refill Medication Summary of Call: Please call in a refill on his Dialantin. Will run out of 10/17/09. Initial call taken by: Janace Aris,  October 14, 2009 2:09 PM    Prescriptions: DILANTIN 100 MG CAPS (PHENYTOIN SODIUM EXTENDED) 2 by mouth two times a day  #120 x 5   Entered and Authorized by:   Tereso Newcomer PA-C   Signed by:   Tereso Newcomer PA-C on 10/15/2009   Method used:   Faxed to ...       [facility] (retail)       [address] [place], [place]  [address]       Ph: [phone]       Fax: [phone]   RxID:   [id number]

## 2010-08-18 NOTE — Progress Notes (Signed)
Summary: Dental referral  Phone Note Outgoing Call   Summary of Call: Debra--see dental referral and OV note. Initial call taken by: Julieanne Manson MD,  November 11, 2009 11:49 AM

## 2010-08-18 NOTE — Letter (Signed)
Summary: Lipid Letter  [facility]  [address] [place], [place] [address]   Phone: [phone]  Fax: [phone]    04/14/2010  [name] [address] [place], [place]  [address]  Dear [name]:  We have carefully reviewed your last lipid profile from 03/13/2010 and the results are noted below with a summary of recommendations for lipid management.    Cholesterol:       319     Goal: <200   HDL "good" Cholesterol:   57     Goal: >45   LDL "bad" Cholesterol:   242     Goal: <100   Triglycerides:       102     Goal: <150  Sorry for the delay in results.  Your cholesterol is way back up.  Are you really taking 40 mg of Crestor daily?  Please call if you do not have a follow up appt. planned so we can discuss.    TLC Diet (Therapeutic Lifestyle Change): Saturated Fats & Transfatty acids should be kept < 7% of total calories ***Reduce Saturated Fats Polyunstaurated Fat can be up to 10% of total calories Monounsaturated Fat Fat can be up to 20% of total calories Total Fat should be no greater than 25-35% of total calories Carbohydrates should be 50-60% of total calories Protein should be approximately 15% of total calories Fiber should be at least 20-30 grams a day ***Increased fiber may help lower LDL Total Cholesterol should be < 200mg /day Consider adding plant stanol/sterols to diet (example: Benacol spread) ***A higher intake of unsaturated fat may reduce Triglycerides and Increase HDL    Adjunctive Measures (may lower LIPIDS and reduce risk of Heart Attack) include: Aerobic Exercise (20-30 minutes 3-4 times a week) Limit Alcohol Consumption Weight Reduction Aspirin 75-81 mg a day by mouth (if not allergic or contraindicated) Dietary Fiber 20-30 grams a day by mouth     Current Medications: 1)    Crestor 20 Mg  Tabs (Rosuvastatin calcium) .... 2 tabs by mouth daily 2)    Atenolol 25 Mg Tabs (Atenolol) .Marland Kitchen.. 1 tab by mouth  daily 3)    Dilantin 100 Mg Caps (Phenytoin sodium extended) .... 2 by mouth two times a day 4)    Trazodone Hcl 50 Mg Tabs (Trazodone hcl) .Marland Kitchen.. 1 by mouth at bedtime 5)    Abilify 5 Mg Tabs (Aripiprazole) .... 2 tabs by mouth every morning 6)    Neurontin 300 Mg Caps (Gabapentin) .Marland Kitchen.. 1 cap four times daily 7)    Benztropine Mesylate 1 Mg Tabs (Benztropine mesylate) .Marland Kitchen.. 1 tab at bedtime as needed for teeth grinding and stiffness 8)    Celexa 20 Mg Tabs (Citalopram hydrobromide) .Marland Kitchen.. 1 by mouth once daily  If you have any questions, please call. We appreciate being able to work with you.   Sincerely,    [facility] Julieanne Manson MD

## 2010-08-18 NOTE — Letter (Signed)
Summary: DENTAL REFERRAL  DENTAL REFERRAL   Imported By: Arta Bruce 12/16/2009 10:03:10  _____________________________________________________________________  External Attachment:    Type:   Image     Comment:   External Document

## 2010-08-18 NOTE — Assessment & Plan Note (Signed)
Summary: left leg pain/ ref to a dentist//gk   Vital Signs:  Patient profile:   30 year old male Weight:      161 pounds Temp:     98.4 degrees F Pulse rate:   78 / minute Pulse rhythm:   regular Resp:     16 per minute BP sitting:   137 / 93  (left arm) Cuff size:   regular  Vitals Entered By: [name] CMA (October 22, 2009 9:07 AM) CC: Bad indigestion for about 2 weeks and has been throwing up his food, also fell and hurt his left calf, he went to the doctor and they told him he brusied the muscle, but it is still huritng and it has been about 3 weeks now.  Wants dental referral if able.  Pt states he is still taking crestor 2 pills at night and had maybe run out for 3-4 days prior to last check. Is Patient Diabetic? No Pain Assessment Patient in pain? yes     Location: leg/mouth Intensity: 8  Does patient need assistance? Ambulation Normal   CC:  Bad indigestion for about 2 weeks and has been throwing up his food, also fell and hurt his left calf, he went to the doctor and they told him he brusied the muscle, and but it is still huritng and it has been about 3 weeks now.  Wants dental referral if able.  Pt states he is still taking crestor 2 pills at night and had maybe run out for 3-4 days prior to last check..  History of Present Illness: 1.  Pain in left calf since falling off porch on 09/28/09.  Porch about 3 feet high.  L[place] Seat to right and landed on right side with medial side of calf and great toe hitting ground.  The toe is fine now.  Xrays of toe and Tib/fib in the ED were without bony injury.  The calf has worsenend with pain since the injury.  Cannot walk far without developing a burning pain.  Can only walk basically to his car in the parking lot--probably a couple hundred feet without pain.  No dyspnea.  No definite chest pain.   2.  Burning sternal pain with acid reflux in past week.  Has taken Mylanta and Pepto bismal.  Describes sense that something is stuck in mid  chest when he swallow.  Has this sensation with solids and liquids.   Recent negative HIV screen in February.  Has not been sexually active for some time.   3.  Dental pain:  has been to hosptal for the pain 4 months ago--was given antibiotics.  Gums on left now swollen and bleeding at times.  4.  Recent use of alcohol--pt. was able to get help early before actually became a binge and went through [facility] for 15 days course and now with [facility] --going to classes and has a sponsor.  Recognizes that he needs these things for when he has diffiult times.  This use of alcohol came about after job loss. Does have parttime job now.    5.  Hyperlipidemia:  Did miss doses before last check--will try to be more consistent  Current Medications (verified): 1)  Crestor 20 Mg  Tabs (Rosuvastatin Calcium) .... 2 Tabs By Mouth Daily 2)  Atenolol 25 Mg Tabs (Atenolol) .Marland Kitchen.. 1 Tab By Mouth Daily 3)  Dilantin 100 Mg Caps (Phenytoin Sodium Extended) .... 2 By Mouth Two Times A Day 4)  Trazodone Hcl 50 Mg  Tabs (Trazodone Hcl) .Marland Kitchen.. 1 By Mouth At Bedtime 5)  Abilify 5 Mg Tabs (Aripiprazole) .... Take 1/2 Tab For Two Weeks Every Morning Then 1 Tab Every Morning--Should Start 08/22/09 6)  Neurontin 100 Mg Caps (Gabapentin) .... Take 3 Capsules Three Times A Day 7)  Benztropine Mesylate 1 Mg Tabs (Benztropine Mesylate) .Marland Kitchen.. 1 Tab At Bedtime As Needed For Teeth Grinding and Stiffness  Allergies (verified): 1)  ! Pcn 2)  ! Morphine  Physical Exam  General:  NAD Mouth:  Gingiva with generalized swelling and erythema.  Several teeth broken with surrounding inflammed and bleeding gums.  No fluctuance noted. Hard palate with thin white adherent discharge overlying erythema.  Back of tongue with adherent white plaque.  Swab taken, but unable to see much on Wet mount/KOH Neck:  Tender anterior cervical adenopathy-right greater than left. Lungs:  Normal respiratory effort, chest expands symmetrically. Lungs are clear to  auscultation, no crackles or wheezes. Heart:  Normal rate and regular rhythm. S1 and S2 normal without gallop, murmur, click, rub or other extra sounds. Abdomen:  soft, normal bowel sounds, no masses, no hepatomegaly, and no splenomegaly.  Mild lower abdominal tenderness. Extremities:  Tender over medial aspect of gastrocnemius.  No definite cord palpated.  No erythema or swelling.   Impression & Recommendations:  Problem # 1:  LEG PAIN, LEFT (ICD-729.5)  Orders: LE Venous Duplex (DVT) (DVT)  Problem # 2:  CANDIDIASIS OF MOUTH (ICD-112.0) Hold on dental care until this is cleared And likely esophageal as well with dysphagia and heartburn symptoms. Recheck HIV with [facility]  Orders: T-HIV Antibody  (Reflex) ([id number])  Problem # 3:  ALCOHOL ABUSE, HX OF (ICD-V11.3) Pt. seems to continue to be getting life in order. Applauded his efforts.  Problem # 4:  HYPERLIPIDEMIA (ICD-272.4) To be consistent with meds and follow up in 2 months His updated medication list for this problem includes:    Crestor 20 Mg Tabs (Rosuvastatin calcium) .Marland Kitchen... 2 tabs by mouth daily  Problem # 5:  HYPERTENSION (ICD-401.9) Up a bit, but has missed meds and in pain today. His updated medication list for this problem includes:    Atenolol 25 Mg Tabs (Atenolol) .Marland Kitchen... 1 tab by mouth daily  Complete Medication List: 1)  Crestor 20 Mg Tabs (Rosuvastatin calcium) .... 2 tabs by mouth daily 2)  Atenolol 25 Mg Tabs (Atenolol) .Marland Kitchen.. 1 tab by mouth daily 3)  Dilantin 100 Mg Caps (Phenytoin sodium extended) .... 2 by mouth two times a day 4)  Trazodone Hcl 50 Mg Tabs (Trazodone hcl) .Marland Kitchen.. 1 by mouth at bedtime 5)  Abilify 5 Mg Tabs (Aripiprazole) .... 2 tabs by mouth every morning 6)  Neurontin 300 Mg Caps (Gabapentin) .Marland Kitchen.. 1 cap four times daily 7)  Benztropine Mesylate 1 Mg Tabs (Benztropine mesylate) .Marland Kitchen.. 1 tab at bedtime as needed for teeth grinding and stiffness 8)  Celexa 20 Mg Tabs (Citalopram  hydrobromide) .Marland Kitchen.. 1 by mouth once daily 9)  Fluconazole 100 Mg Tabs (Fluconazole) .... 2 tabs by mouth today, then 1 tab by mouth daily for 14 days.  Patient Instructions: 1)  Follow up with Dr. Delrae Alfred in 2 weeks 2)  Schedule fasting labs in 2 months--FLP Prescriptions: FLUCONAZOLE 100 MG TABS (FLUCONAZOLE) 2 tabs by mouth today, then 1 tab by mouth daily for 14 days.  #16 x 0   Entered and Authorized by:   Julieanne Manson MD   Signed by:   Julieanne Manson MD on 10/22/2009  Method used:   Faxed to ...       [facility] (retail)       [address] [place], [place]  [address]       Ph: [phone]       Fax: [phone]   RxID:   [id number]

## 2010-08-18 NOTE — Assessment & Plan Note (Signed)
Summary: 4 MONTH FU//KT   Vital Signs:  Patient profile:   30 year old male Height:      65 inches Weight:      164.4 pounds Temp:     97.6 degrees F oral Pulse rate:   80 / minute Pulse rhythm:   regular Resp:     16 per minute BP sitting:   106 / 78  (left arm) Cuff size:   regular  Vitals Entered By: Michelle Nasuti (March 13, 2010 9:40 AM) CC: PT PRESENTS TO CLINIC FOR FOLLOW UP Pain Assessment Patient in pain? no       Does patient need assistance? Functional Status Self care Ambulation Normal   CC:  PT PRESENTS TO CLINIC FOR FOLLOW UP.  History of Present Illness: 1.  Hypertension:  Taking meds, no problems.    2.  Hyperlipidemia:  Taking Crestor--has not missed at all recently.  Fasting today save for a bit of cream with coffee.  3.  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  Would like to continue on Dilantin as we have discussed before until he has a whole year under his belt.    4.  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  Was seen at [facility] yesterday and doing well.   Current Medications (verified): 1)  Crestor 20 Mg  Tabs (Rosuvastatin Calcium) .... 2 Tabs By Mouth Daily 2)  Atenolol 25 Mg Tabs (Atenolol) .Marland Kitchen.. 1 Tab By Mouth Daily 3)  Dilantin 100 Mg Caps (Phenytoin Sodium Extended) .... 2 By Mouth Two Times A Day 4)  Trazodone Hcl 50 Mg Tabs (Trazodone Hcl) .Marland Kitchen.. 1 By Mouth At Bedtime 5)  Abilify 5 Mg Tabs (Aripiprazole) .... 2 Tabs By Mouth Every Morning 6)  Neurontin 300 Mg Caps (Gabapentin) .Marland Kitchen.. 1 Cap Four Times Daily 7)  Benztropine Mesylate 1 Mg Tabs (Benztropine Mesylate) .Marland Kitchen.. 1 Tab At Bedtime As Needed For Teeth Grinding and Stiffness 8)  Celexa 20 Mg Tabs (Citalopram Hydrobromide) .Marland Kitchen.. 1 By Mouth Once Daily 9)  Cleocin 300 Mg Caps (Clindamycin Hcl) .Marland Kitchen.. 1 Cap By Mouth Three Times A Day  Allergies (verified): 1)  ! Pcn 2)  ! Morphine  Physical Exam  General:  NAD, happy Lungs:  Normal respiratory  effort, chest expands symmetrically. Lungs are clear to auscultation, no crackles or wheezes. Heart:  Normal rate and regular rhythm. S1 and S2 normal without gallop, murmur, click, rub or other extra sounds.  Radial pulses normal and equal Extremities:  No edema.  good DP, PT pulses bilaterally   Impression & Recommendations:  Problem # 1:  SEIZURE DISORDER (ICD-780.39) Felt to be alchohol withdrawal induced. Will see about coming off Dilantin at [facility] in 4 months. His updated medication list for this problem includes:    Dilantin 100 Mg Caps (Phenytoin sodium extended) .Marland Kitchen... 2 by mouth two times a day    Neurontin 300 Mg Caps (Gabapentin) .Marland Kitchen... 1 cap four times daily  Orders: T-Dilantin (Phenytoin) ([id number])  Problem # 2:  DENTAL PAIN (ICD-525.9) Has a dental visit planned--not seen yet.  Problem # 3:  HYPERLIPIDEMIA (ICD-272.4)  His updated medication list for this problem includes:    Crestor 20 Mg Tabs (Rosuvastatin calcium) .Marland Kitchen... 2 tabs by mouth daily  Problem # 4:  HYPERTENSION (ICD-401.9) Controlled. His updated medication list for this problem includes:    Atenolol 25 Mg Tabs (Atenolol) .Marland Kitchen... 1 tab by mouth daily  Complete Medication List: 1)  Crestor 20 Mg Tabs (Rosuvastatin calcium) .... 2 tabs by mouth daily 2)  Atenolol 25 Mg Tabs (Atenolol) .Marland Kitchen.. 1 tab by mouth daily 3)  Dilantin 100 Mg Caps (Phenytoin sodium extended) .... 2 by mouth two times a day 4)  Trazodone Hcl 50 Mg Tabs (Trazodone hcl) .Marland Kitchen.. 1 by mouth at bedtime 5)  Abilify 5 Mg Tabs (Aripiprazole) .... 2 tabs by mouth every morning 6)  Neurontin 300 Mg Caps (Gabapentin) .Marland Kitchen.. 1 cap four times daily 7)  Benztropine Mesylate 1 Mg Tabs (Benztropine mesylate) .Marland Kitchen.. 1 tab at bedtime as needed for teeth grinding and stiffness 8)  Celexa 20 Mg Tabs (Citalopram hydrobromide) .Marland Kitchen.. 1 by mouth once daily  Other Orders: T-Comprehensive Metabolic Panel [id number]) T-CBC w/Diff ([id number]) T-Lipid Profile  ([id number])  Patient Instructions: 1)  Follow up with Dr. Delrae Alfred in 4 months --CPE  Prescriptions: DILANTIN 100 MG CAPS (PHENYTOIN SODIUM EXTENDED) 2 by mouth two times a day  #120 x 6   Entered and Authorized by:   Julieanne Manson MD   Signed by:   Julieanne Manson MD on 03/13/2010   Method used:   Faxed to ...       [facility] (retail)       [address] [place], [place]  [address]       Ph: [phone]       Fax: [phone]   RxID:   [id number] ATENOLOL 25 MG TABS (ATENOLOL) 1 tab by mouth daily  #30 x 11   Entered and Authorized by:   Julieanne Manson MD   Signed by:   Julieanne Manson MD on 03/13/2010   Method used:   Faxed to ...       [facility] (retail)       [address] [place], [place]  [address]       Ph: [phone]       Fax: [phone]   RxID:   [id number] CRESTOR 20 MG  TABS (ROSUVASTATIN CALCIUM) 2 tabs by mouth daily  #60 x 11   Entered and Authorized by:   Julieanne Manson MD   Signed by:   Julieanne Manson MD on 03/13/2010   Method used:   Faxed to ...       [facility] (retail)       [address] [place], [place]  [address]       Ph: [phone]       Fax: [phone]   RxID:   [id number]

## 2010-08-18 NOTE — Progress Notes (Signed)
  Phone Note Outgoing Call   Call placed by: Julieanne Manson MD,  October 29, 2009 1:24 PM Summary of Call: Called and spoke with Prabhav--increased burning pain in calf with minimal weight bearing and great toe gets blue with minimal walking.  Spoke with Dr. Bosie Helper associate, Dr. Fabienne Bruns.  He states that if he feels he is getting worse, they can certainly see him again.  Otherwise attempt at thrombectomy on the 19th, but if artery is injured, will need bypass. Arvin Collard, who really feels his symptoms are worse--he will call and set up an appt. today. Secondly, regarding the kidney mass, he is following every 6 months with Dr. Aldean Ast with scans--Dr. Aldean Ast feels this is not a malignancy, but a cyst.   Initial call taken by: Julieanne Manson MD,  October 29, 2009 1:30 PM

## 2010-08-18 NOTE — Letter (Signed)
Summary: REQUESTED RECORDS FOR SELF//PICKED UP  REQUESTED RECORDS FOR SELF//PICKED UP   Imported By: Arta Bruce 09/15/2009 15:31:12  _____________________________________________________________________  External Attachment:    Type:   Image     Comment:   External Document

## 2010-08-18 NOTE — Letter (Signed)
Summary: MAILED REQUESTED RECORDS TO [facility]  MAILED REQUESTED RECORDS TO [facility]   Imported By: Arta Bruce 03/19/2010 12:51:39  _____________________________________________________________________  External Attachment:    Type:   Image     Comment:   External Document

## 2010-08-18 NOTE — Progress Notes (Signed)
Summary: Phone call to Urology/Dr. Aldean Ast  Phone Note Outgoing Call   Summary of Call: Spoke with Dr. Valda Lamb nurse (urology).  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  Pt. was hospitalized for other reasons on 04/27/10.  CT of abdomen showed increased size in mass (3mm up from 2 mm).  Dr. Aldean Ast has already seen the CT and has rescheduled pt. for follow on 07/10/10. Initial call taken by: Julieanne Manson MD,  May 22, 2010 9:04 AM

## 2010-08-18 NOTE — Letter (Signed)
Summary: PSYCHIATRIC  ADMISSION  PSYCHIATRIC  ADMISSION   Imported By: Arta Bruce 10/01/2009 15:00:45  _____________________________________________________________________  External Attachment:    Type:   Image     Comment:   External Document

## 2010-08-18 NOTE — Letter (Signed)
Summary: TEST ORDER FORM//[facility]//APPT DATE & TIME  TEST ORDER FORM//[facility]//APPT DATE & TIME   Imported By: Arta Bruce 10/22/2009 10:38:16  _____________________________________________________________________  External Attachment:    Type:   Image     Comment:   External Document

## 2010-08-18 NOTE — Assessment & Plan Note (Signed)
Summary: discuss medication//gk   Vital Signs:  Patient profile:   30 year old male Weight:      164 pounds Temp:     98.2 degrees F Pulse rate:   73 / minute Pulse rhythm:   regular Resp:     16 per minute BP sitting:   124 / 78  (left arm) Cuff size:   regular  Vitals Entered By: [name] CMA (Dec 04, 2009 2:22 PM) CC: f/u after taking antibx for dental issues per pt Is Patient Diabetic? No Pain Assessment Patient in pain? no       Does patient need assistance? Ambulation Normal   CC:  f/u after taking antibx for dental issues per pt.  History of Present Illness: 1.  Dental Pain:  finished Clindamycin and tolerated fine.  Pain improved, but never heard about dental referral.  States has a new phone number for about 2 weeks.  Allergies (verified): 1)  ! Pcn 2)  ! Morphine  Physical Exam  Mouth:  Significant caries of multiple teeth and teeth broken at gumline or missing.  No definite swelling of gingiva today   Impression & Recommendations:  Problem # 1:  DENTAL PAIN (ICD-525.9) Still with significant pain. Checking where he is with his referral. Suspect secondary to not having a good phone number  Complete Medication List: 1)  Crestor 20 Mg Tabs (Rosuvastatin calcium) .... 2 tabs by mouth daily 2)  Atenolol 25 Mg Tabs (Atenolol) .Marland Kitchen.. 1 tab by mouth daily 3)  Dilantin 100 Mg Caps (Phenytoin sodium extended) .... 2 by mouth two times a day 4)  Trazodone Hcl 50 Mg Tabs (Trazodone hcl) .Marland Kitchen.. 1 by mouth at bedtime 5)  Abilify 5 Mg Tabs (Aripiprazole) .... 2 tabs by mouth every morning 6)  Neurontin 300 Mg Caps (Gabapentin) .Marland Kitchen.. 1 cap four times daily 7)  Benztropine Mesylate 1 Mg Tabs (Benztropine mesylate) .Marland Kitchen.. 1 tab at bedtime as needed for teeth grinding and stiffness 8)  Celexa 20 Mg Tabs (Citalopram hydrobromide) .Marland Kitchen.. 1 by mouth once daily 9)  Cleocin 300 Mg Caps (Clindamycin hcl) .Marland Kitchen.. 1 cap by mouth three times a day

## 2010-08-18 NOTE — Assessment & Plan Note (Signed)
Summary: follow up with Dr Delrae Alfred in 4 months / htn/ chol/etc//////gk   Vital Signs:  Patient profile:   30 year old male Weight:      157 pounds Temp:     98.1 degrees F Pulse rate:   80 / minute Pulse rhythm:   regular Resp:     18 per minute BP sitting:   134 / 99  (left arm) Cuff size:   regular  Vitals Entered By: [name] CMA (Dec 12, 2009 9:05 AM) CC: f/u on dental issues Is Patient Diabetic? No Pain Assessment Patient in pain? yes     Location: teeth Intensity: 3  Does patient need assistance? Ambulation Normal   CC:  f/u on dental issues.  History of Present Illness: Appt. not needed--has been seen frequently her recently and this is an appt. left over from visit 4 months ago.  Pt. still has not heard from dentist  Current Medications (verified): 1)  Crestor 20 Mg  Tabs (Rosuvastatin Calcium) .... 2 Tabs By Mouth Daily 2)  Atenolol 25 Mg Tabs (Atenolol) .Marland Kitchen.. 1 Tab By Mouth Daily 3)  Dilantin 100 Mg Caps (Phenytoin Sodium Extended) .... 2 By Mouth Two Times A Day 4)  Trazodone Hcl 50 Mg Tabs (Trazodone Hcl) .Marland Kitchen.. 1 By Mouth At Bedtime 5)  Abilify 5 Mg Tabs (Aripiprazole) .... 2 Tabs By Mouth Every Morning 6)  Neurontin 300 Mg Caps (Gabapentin) .Marland Kitchen.. 1 Cap Four Times Daily 7)  Benztropine Mesylate 1 Mg Tabs (Benztropine Mesylate) .Marland Kitchen.. 1 Tab At Bedtime As Needed For Teeth Grinding and Stiffness 8)  Celexa 20 Mg Tabs (Citalopram Hydrobromide) .Marland Kitchen.. 1 By Mouth Once Daily 9)  Cleocin 300 Mg Caps (Clindamycin Hcl) .Marland Kitchen.. 1 Cap By Mouth Three Times A Day  Allergies (verified): 1)  ! Pcn 2)  ! Morphine   Complete Medication List: 1)  Crestor 20 Mg Tabs (Rosuvastatin calcium) .... 2 tabs by mouth daily 2)  Atenolol 25 Mg Tabs (Atenolol) .Marland Kitchen.. 1 tab by mouth daily 3)  Dilantin 100 Mg Caps (Phenytoin sodium extended) .... 2 by mouth two times a day 4)  Trazodone Hcl 50 Mg Tabs (Trazodone hcl) .Marland Kitchen.. 1 by mouth at bedtime 5)  Abilify 5 Mg Tabs (Aripiprazole) .... 2  tabs by mouth every morning 6)  Neurontin 300 Mg Caps (Gabapentin) .Marland Kitchen.. 1 cap four times daily 7)  Benztropine Mesylate 1 Mg Tabs (Benztropine mesylate) .Marland Kitchen.. 1 tab at bedtime as needed for teeth grinding and stiffness 8)  Celexa 20 Mg Tabs (Citalopram hydrobromide) .Marland Kitchen.. 1 by mouth once daily 9)  Cleocin 300 Mg Caps (Clindamycin hcl) .Marland Kitchen.. 1 cap by mouth three times a day

## 2010-08-18 NOTE — Assessment & Plan Note (Signed)
Summary: HOSP FU/CHOL MED CHANGED//KT   Vital Signs:  Patient profile:   30 year old male Height:      65 inches Weight:      166 pounds BMI:     27.72 O2 Sat:      100 % Temp:     97.7 degrees F oral Pulse rate:   68 / minute Pulse rhythm:   regular Resp:     18 per minute BP sitting:   124 / 86  (right arm) Cuff size:   regular  Vitals Entered By: [place] [name] (May 22, 2010 12:17 PM) CC: hosp f/u....  pt says they took him off of  chol med. Is Patient Diabetic? No Pain Assessment Patient in pain? no       Does patient need assistance? Functional Status Self care Ambulation Normal   CC:  hosp f/u....  pt says they took him off of  chol med.[name]  History of Present Illness: 1.  Pt. hospitalized 10/10-10/17 for accidental  overdose of meds.  Pt. states the discharge summary was not correct--he was on his medications regularly until had a 3 days of binge drinking--had 6 bottles of 40 oz.-- after a client died and a friend died of HIV.  States he is still going to [employer] and has a sponsor that he did call with his recent drinking episode, but waited until after started drinking for 24 hours.  Was drunk when he accidentally took too many pills.  Is in process of getting set up with a counselor.  Has moved back with parents as well.  Was initiated on Neurontin and Buspar in hospital.  Trazodone,and Abilify have been increased since last here.  Struggling with panic attacks as well.  2.  Hypertension:  Taking meds, no problems  3.  Elevated liver enzymes in hospital--AST peaked at 794 with peak of 577 for ALT.  Down to 72 and 186 repectively at discharge.  Crestor has been held.  Fatty liver noted on CT scan.  4.  Left kidney mass:  again, noted to be concerning for renal cell ca.  Size increase of 1 mm since last checked.  Called Dr. Valda Lamb office today--he has seen the scan and has set [name] up for recheck with him in December.  Current Medications (verified): 1)   Crestor 20 Mg  Tabs (Rosuvastatin Calcium) .... 2 Tabs By Mouth Daily 2)  Atenolol 25 Mg Tabs (Atenolol) .[name].. 1 Tab By Mouth Daily 3)  Dilantin 100 Mg Caps (Phenytoin Sodium Extended) .... 2 By Mouth Two Times A Day 4)  Trazodone Hcl 50 Mg Tabs (Trazodone Hcl) .[name].. 1 By Mouth At Bedtime 5)  Abilify 5 Mg Tabs (Aripiprazole) .... 2 Tabs By Mouth Every Morning 6)  Neurontin 300 Mg Caps (Gabapentin) .[name].. 1 Cap Four Times Daily 7)  Benztropine Mesylate 1 Mg Tabs (Benztropine Mesylate) .[name].. 1 Tab At Bedtime As Needed For Teeth Grinding and Stiffness 8)  Celexa 20 Mg Tabs (Citalopram Hydrobromide) .[name].. 1 By Mouth Once Daily  Allergies (verified): 1)  ! Pcn 2)  ! Morphine  Physical Exam  General:  NAD Lungs:  Normal respiratory effort, chest expands symmetrically. Lungs are clear to auscultation, no crackles or wheezes. Heart:  Normal rate and regular rhythm. S1 and S2 normal without gallop, murmur, click, rub or other extra sounds. Abdomen:  Bowel sounds positive,abdomen soft and non-tender without masses, organomegaly or hernias noted.   Impression & Recommendations:  Problem # 1:  ALCOHOL  ABUSE, HX OF (ICD-V11.3) Recurrent use with resulting accidental overdose   Orders: Psychology Referral (Psychology)  As pt. has not yet heard back regarding counseling referral--would like [place] to take the lead on this.  Problem # 2:  PANIC DISORDER (ICD-300.01) [facility]. His updated medication list for this problem includes:    Trazodone Hcl 100 Mg Tabs (Trazodone hcl) .[name] KitchenMarland KitchenMarland KitchenMarland [name] 3 tabs by mouth at bedtime--[facility]    Celexa 20 Mg Tabs (Citalopram hydrobromide) .[name]... 1 by mouth once daily    Buspirone Hcl 7.5 Mg Tabs (Buspirone hcl) .[name]... 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 Aldean Ast  Problem # 5:  HYPERLIPIDEMIA (ICD-272.4) Restart Crestor as above His updated medication list for this problem includes:    Crestor 20 Mg Tabs (Rosuvastatin calcium) .[name]... 2 tabs by mouth daily  Complete Medication List: 1)  Crestor 20 Mg Tabs (Rosuvastatin calcium) .... 2 tabs by mouth daily 2)  Atenolol 25 Mg Tabs (Atenolol) .[name].. 1 tab by mouth daily 3)  Dilantin 100 Mg Caps (Phenytoin sodium extended) .... 2 by mouth two times a day 4)  Trazodone Hcl 100 Mg Tabs (Trazodone hcl) .... 3 tabs by mouth at bedtime--[facility] 5)  Abilify 10 Mg Tabs (Aripiprazole) .[name].. 1 tab by mouth in morning--[facility] 6)  Neurontin 300 Mg Caps (Gabapentin) .[name].. 1 cap four times daily 7)  Benztropine Mesylate 1 Mg Tabs (Benztropine mesylate) .[name].. 1 tab at bedtime as needed for teeth grinding and stiffness 8)  Celexa 20 Mg Tabs (Citalopram hydrobromide) .[name].. 1 by mouth once daily 9)  Buspirone Hcl 7.5 Mg Tabs (Buspirone hcl) .[name].. 1 tab by mouth two times a day --[facility] 10)  Neurontin 300 Mg Caps (Gabapentin) .... 2 caps in morning and 5 p.m and 3 caps at bedtime--[facility]  Patient Instructions: 1)  fasting lab visit for FLP/liver enzymes in 2 months 2)  Follow up with Dr. Delrae Alfred in 6 months--htn, panic attacks   Orders Added: 1)  Psychology Referral [Psychology] 2)  T-Hepatic Function [[id number]] 3)  Est. Patient Level IV [62952]

## 2010-08-18 NOTE — Letter (Signed)
Summary: [facility]   Imported By: Arta Bruce 01/27/2010 10:29:16  _____________________________________________________________________  External Attachment:    Type:   Image     Comment:   External Document

## 2010-08-20 NOTE — Letter (Signed)
Summary: MAILED REQUESTED RECORDS TO [facility]  MAILED REQUESTED RECORDS TO [facility]   Imported By: Arta Bruce 06/30/2010 10:51:19  _____________________________________________________________________  External Attachment:    Type:   Image     Comment:   External Document

## 2010-08-21 ENCOUNTER — Emergency Department (HOSPITAL_COMMUNITY)
Admission: EM | Admit: 2010-08-21 | Discharge: 2010-08-21 | Disposition: A | Discharge status: Home / Self Care | Payer: Self-pay | Attending: Emergency Medicine | Admitting: Emergency Medicine

## 2010-08-21 ENCOUNTER — Emergency Department (HOSPITAL_COMMUNITY): Payer: Self-pay

## 2010-08-21 DIAGNOSIS — J029 Acute pharyngitis, unspecified: Secondary | ICD-10-CM | POA: Insufficient documentation

## 2010-08-21 DIAGNOSIS — G40909 Epilepsy, unspecified, not intractable, without status epilepticus: Secondary | ICD-10-CM | POA: Insufficient documentation

## 2010-08-21 DIAGNOSIS — R059 Cough, unspecified: Secondary | ICD-10-CM | POA: Insufficient documentation

## 2010-08-21 DIAGNOSIS — R05 Cough: Secondary | ICD-10-CM | POA: Insufficient documentation

## 2010-08-21 DIAGNOSIS — F458 Other somatoform disorders: Secondary | ICD-10-CM | POA: Insufficient documentation

## 2010-08-21 DIAGNOSIS — I10 Essential (primary) hypertension: Secondary | ICD-10-CM | POA: Insufficient documentation

## 2010-08-21 LAB — RAPID STREP SCREEN (MED CTR MEBANE ONLY): Streptococcus, Group A Screen (Direct): NEGATIVE

## 2010-09-06 IMAGING — CR DG CHEST 2V
2 series · 2 of 2 positions shown · non-contrast
Comparison: 08/30/2006

CLINICAL DATA: Flu-like symptoms

CHEST - 2 VIEW

[w chest pa]
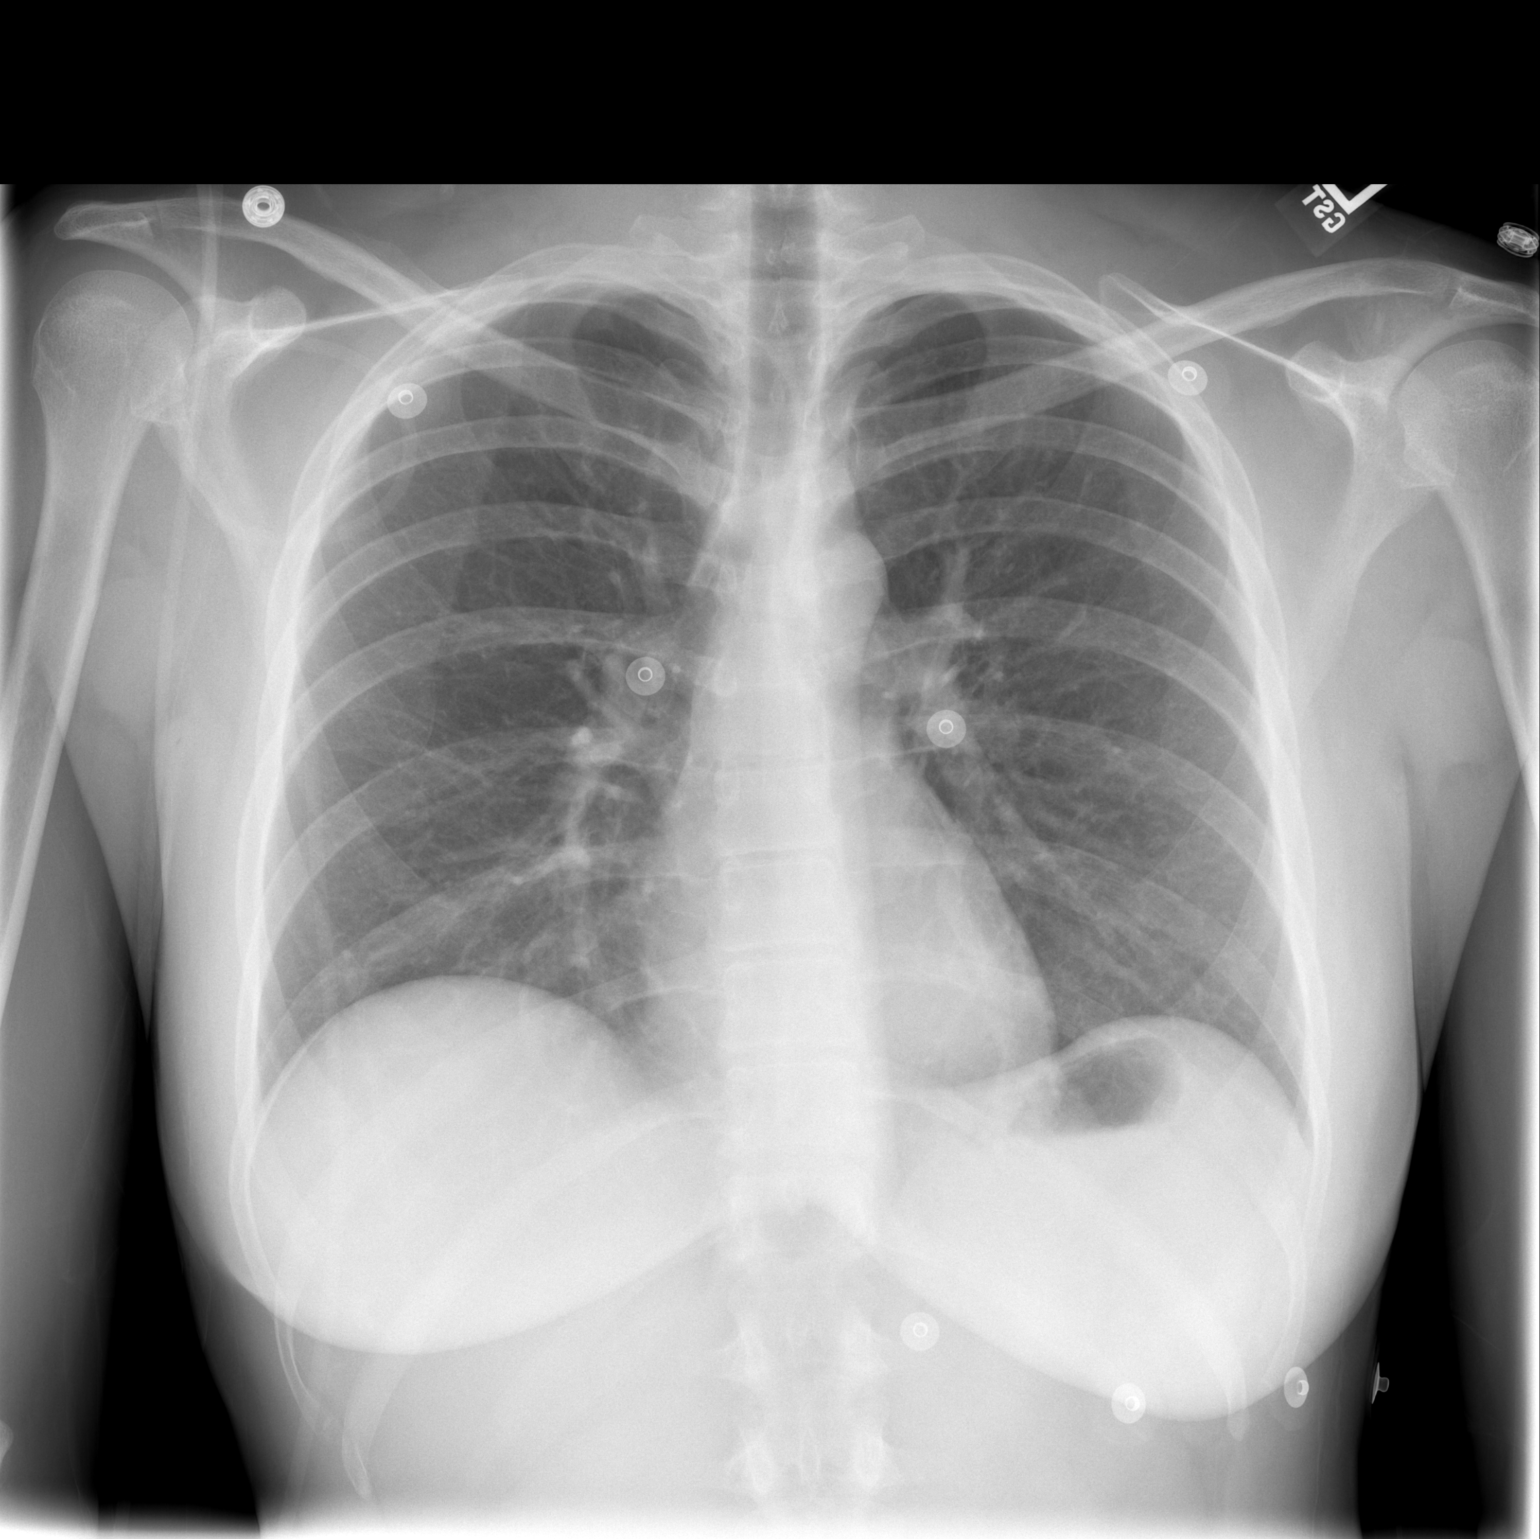

[w chest lat]
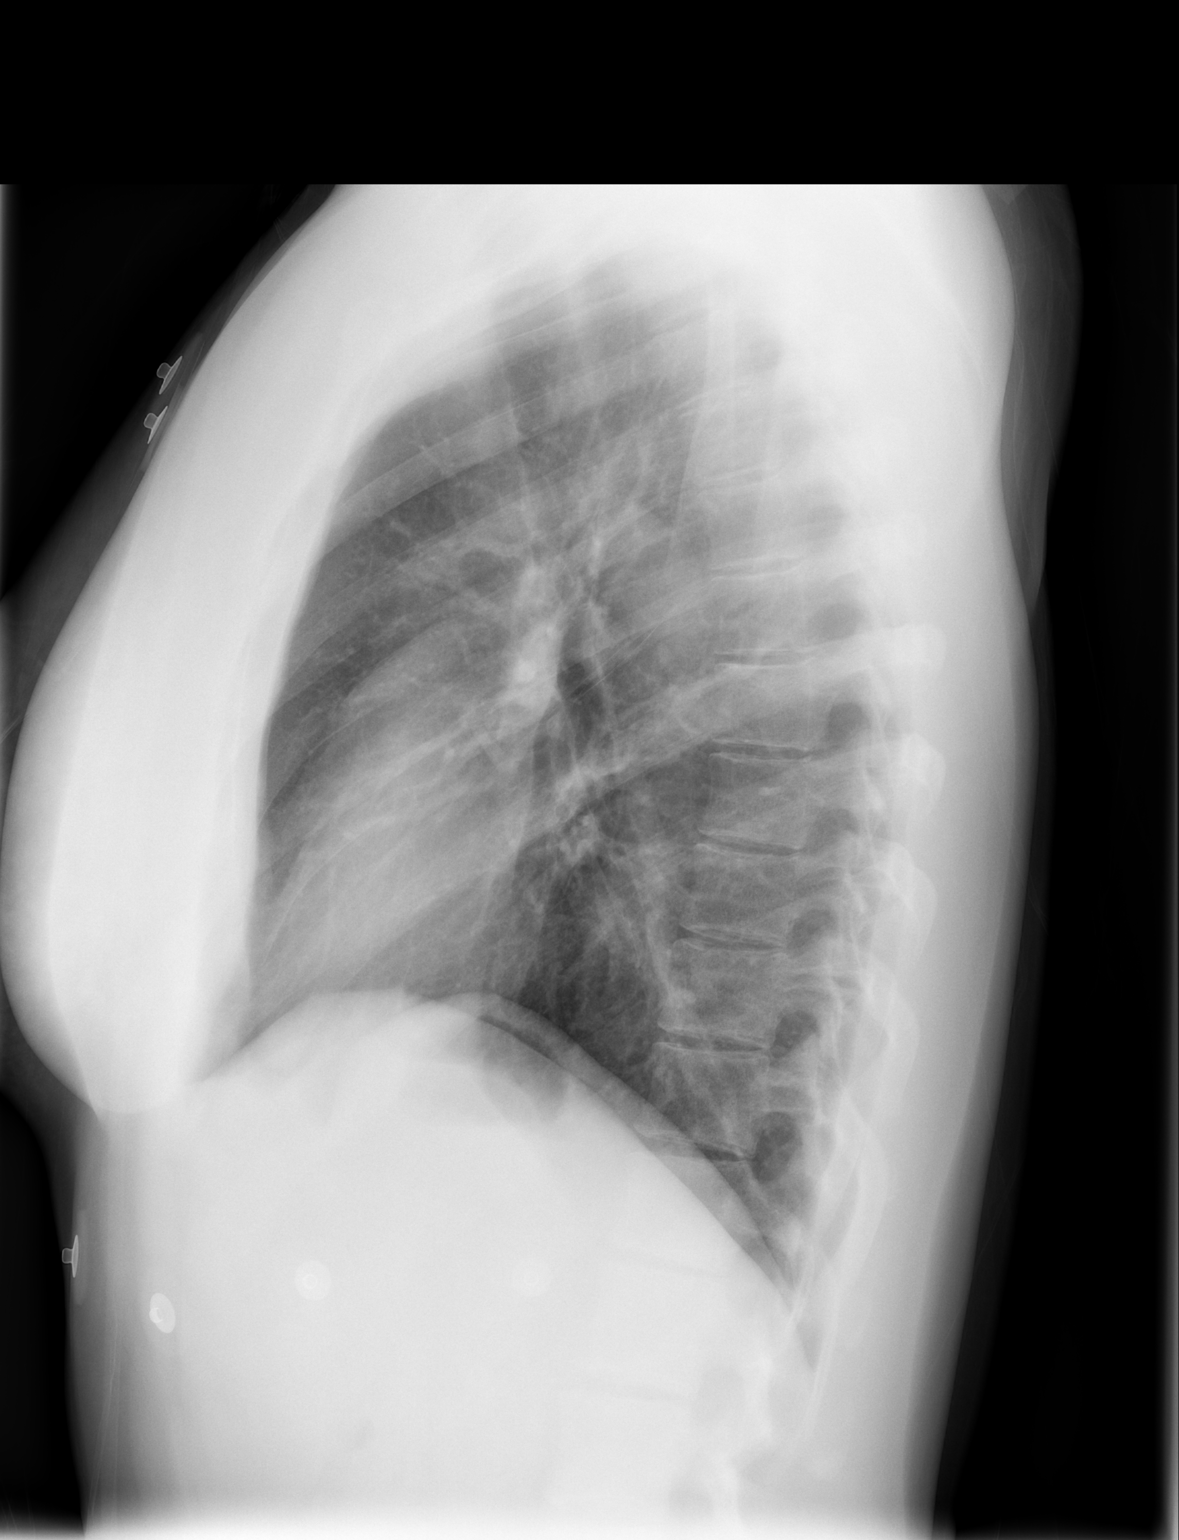

[2 of 2 positions shown; findings below may reference images not displayed]

FINDINGS: The heart size and mediastinal contours are within normal
limits.  Both lungs are clear.  The visualized skeletal structures
are unremarkable.
IMPRESSION: No acute cardiopulmonary abnormalities.

## 2010-09-07 IMAGING — CT CT ANGIO CHEST
2 of 7 series · 19 of 36 positions shown · IV contrast (APPLIED)
Comparison: Chest radiograph 04/04/2008

CLINICAL DATA: Chest pain, cough.  The patient is a 26-year-old
transgender male previously taking hormone therapy, status post
silicone injections for breast augmentation.

CT ANGIOGRAPHY CHEST
TECHNIQUE: Multidetector CT imaging of the chest using the
standard protocol during bolus administration of intravenous
contrast. Multiplanar reconstructed images obtained and reviewed to
evaluate the vascular anatomy.
Contrast: 80 ml Omniscan 300 IV contrast

[Series 6: pulm embolism 1.0 b25f thins · axial · 0.60mm/px · z∈[-326,-72]mm · 17 of 286 slices shown]
[im 16/286  lung]
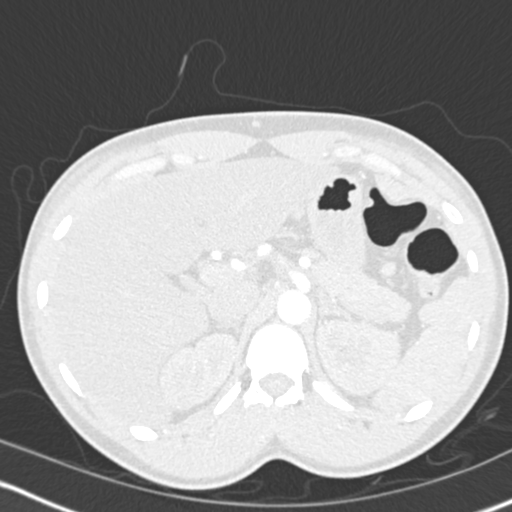
[im 31/286  mediastinal]
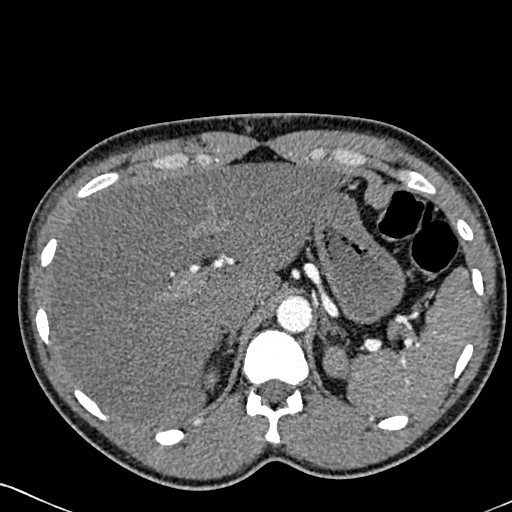
[im 46/286  lung]
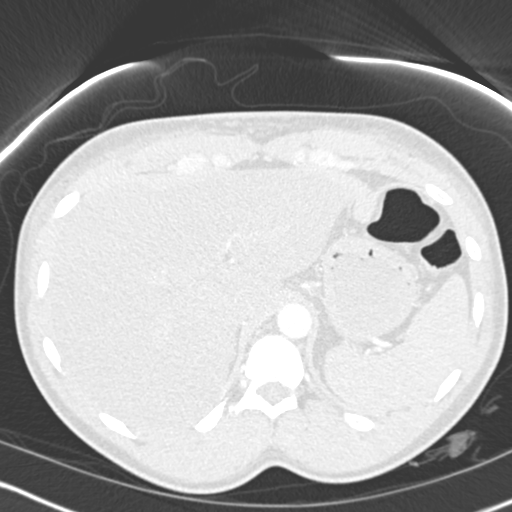
[im 61/286  mediastinal]
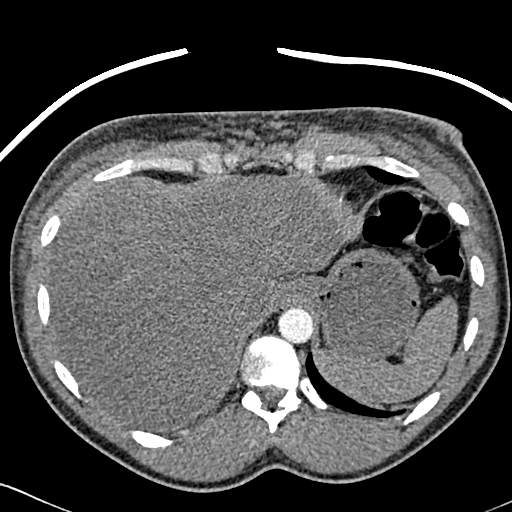
[im 76/286  lung]
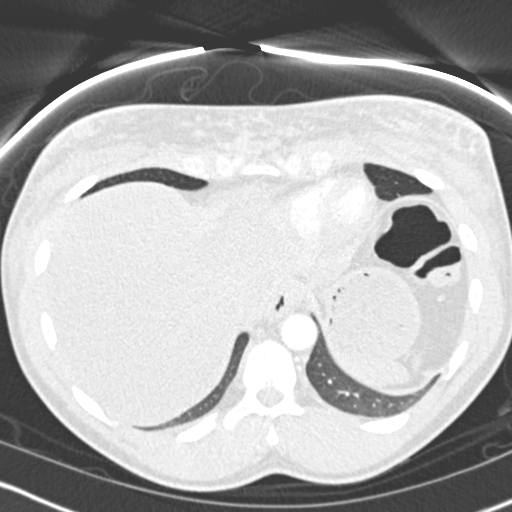
[im 91/286  mediastinal]
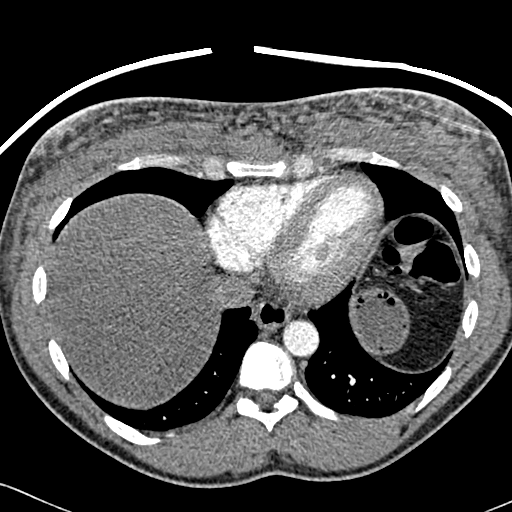
[im 106/286  lung]
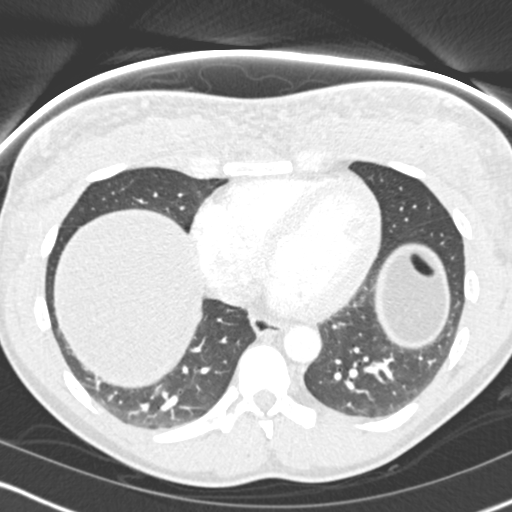
[im 121/286  mediastinal]
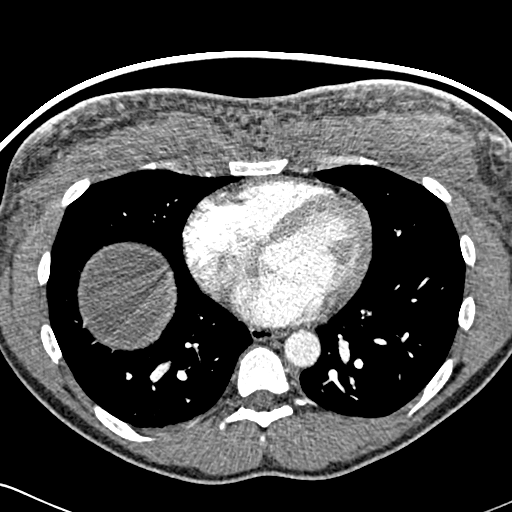
[im 151/286  lung]
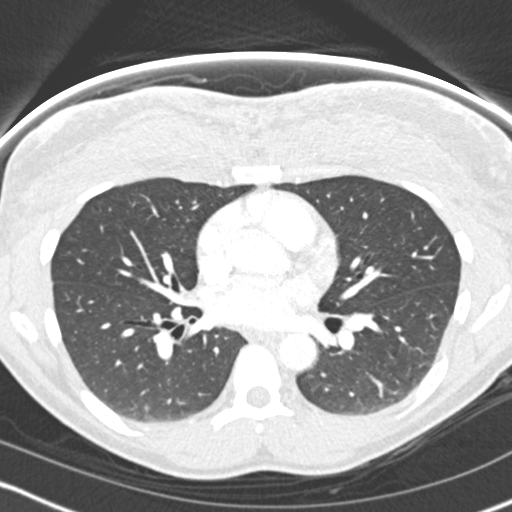
[im 166/286  mediastinal]
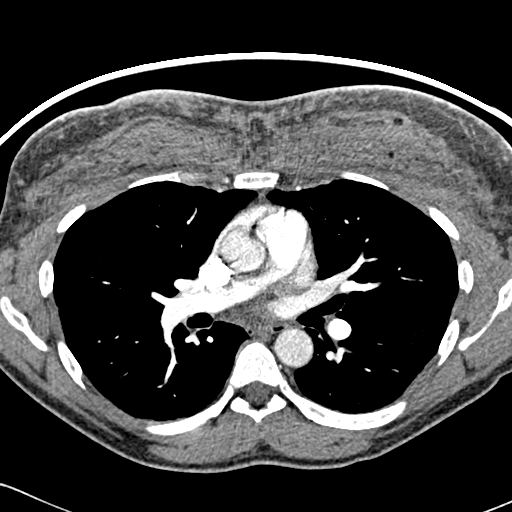
[im 181/286  lung]
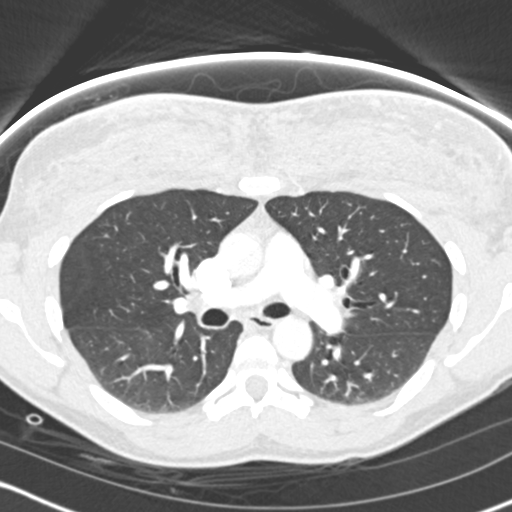
[im 196/286  mediastinal]
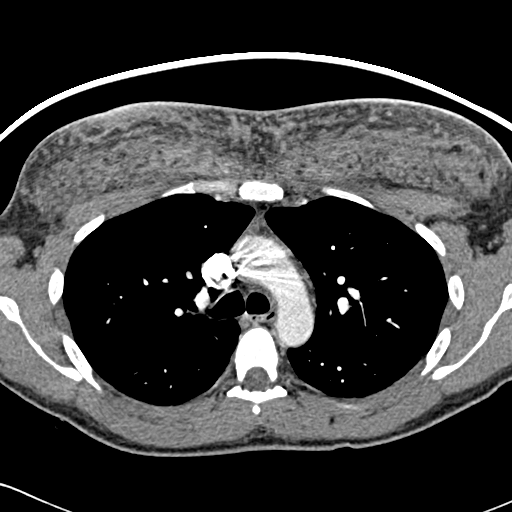
[im 211/286  lung]
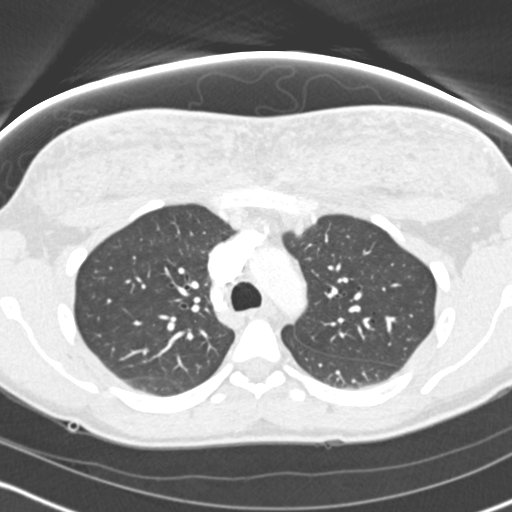
[im 226/286  mediastinal]
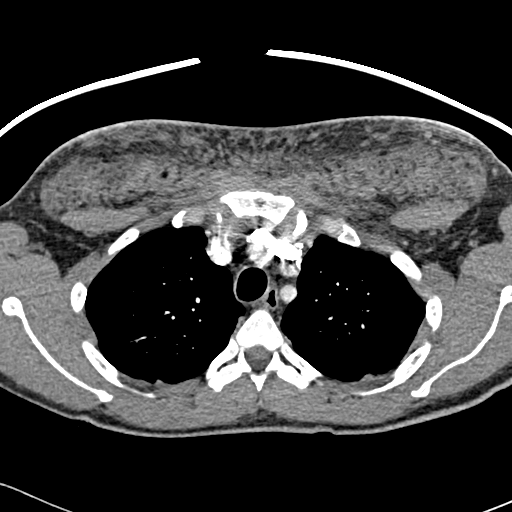
[im 241/286  lung]
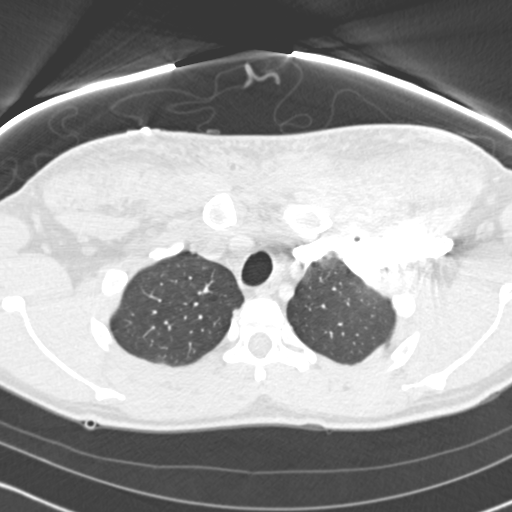
[im 256/286  mediastinal]
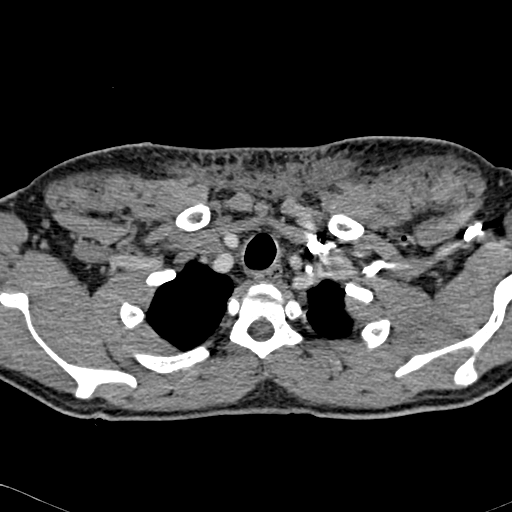
[im 271/286  lung]
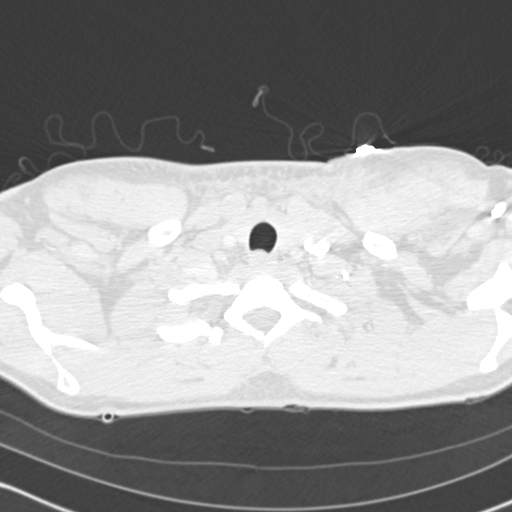

[Series 602: coronal mips · coronal · 0.60mm/px · 2 of 87 slices shown]
[im 29/87  mediastinal]
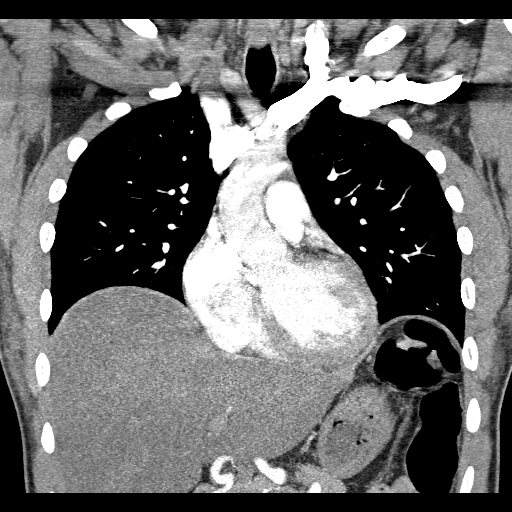
[im 58/87  mediastinal]
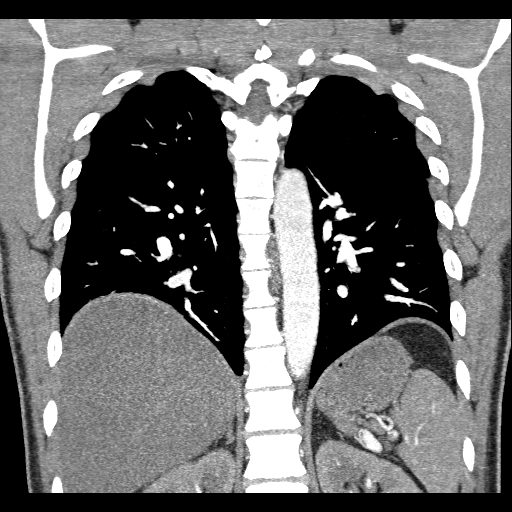

[19 of 36 positions shown; findings below may reference images not displayed]

FINDINGS: There is diffuse inhomogeneity and stranding of the
anterior chest wall soft tissues, compatible with prior silicone
injections.

Heart size is normal.  No pericardial or pleural effusion.  No
lymphadenopathy.

The study is of adequate technical quality for evaluation for
pulmonary embolism up to and including the 3rd order pulmonary
arteries. No focal filling defect is seen to suggest acute
pulmonary embolism.

Incomplete imaging of the upper abdomen demonstrates mild fatty
infiltration of the liver.

Lung fields demonstrate no focal pulmonary opacity.

No acute osseous finding.
IMPRESSION: No acute cardiopulmonary process.  No CT evidence for pulmonary
embolism.

Anterior chest wall findings compatible with prior silicone
injections.

## 2010-09-28 LAB — COMPREHENSIVE METABOLIC PANEL
ALT: 35 U/L (ref 0–53)
Alkaline Phosphatase: 50 U/L (ref 39–117)
CO2: 27 mEq/L (ref 19–32)
Chloride: 100 mEq/L (ref 96–112)
GFR calc non Af Amer: >60 mL/min (ref >60)
Glucose, Bld: 103 mg/dL — ABNORMAL HIGH (ref 70–99)
Potassium: 3.5 mEq/L (ref 3.5–5.1)
Sodium: 138 mEq/L (ref 135–145)

## 2010-09-28 LAB — DIFFERENTIAL
Basophils Relative: 0 % (ref 0–1)
Eosinophils Absolute: 0.1 10*3/uL (ref 0.0–0.7)
Monocytes Relative: 11 % (ref 3–12)
Neutrophils Relative %: 55 % (ref 43–77)

## 2010-09-28 LAB — CBC
HCT: 44.5 % (ref 39.0–52.0)
Hemoglobin: 15.5 g/dL (ref 13.0–17.0)
WBC: 4.4 10*3/uL (ref 4.0–10.5)

## 2010-09-30 LAB — HEPATIC FUNCTION PANEL
Alkaline Phosphatase: 63 U/L (ref 39–117)
Indirect Bilirubin: 0.5 mg/dL (ref 0.3–0.9)
Total Protein: 6.7 g/dL (ref 6.0–8.3)

## 2010-10-01 LAB — CBC
HCT: 41.4 % (ref 39.0–52.0)
HCT: 41.4 % (ref 39.0–52.0)
HCT: 41.9 % (ref 39.0–52.0)
HCT: 44.3 % (ref 39.0–52.0)
Hemoglobin: 14.5 g/dL (ref 13.0–17.0)
Hemoglobin: 14.6 g/dL (ref 13.0–17.0)
Hemoglobin: 15.2 g/dL (ref 13.0–17.0)
MCH: 29.7 pg (ref 26.0–34.0)
MCH: 30 pg (ref 26.0–34.0)
MCH: 30.5 pg (ref 26.0–34.0)
MCHC: 34.6 g/dL (ref 30.0–36.0)
MCHC: 35.1 g/dL (ref 30.0–36.0)
MCHC: 35.3 g/dL (ref 30.0–36.0)
MCV: 85.9 fL (ref 78.0–100.0)
MCV: 86.5 fL (ref 78.0–100.0)
MCV: 86.9 fL (ref 78.0–100.0)
RBC: 4.79 MIL/uL (ref 4.22–5.81)
RBC: 4.82 MIL/uL (ref 4.22–5.81)
RBC: 5.12 MIL/uL (ref 4.22–5.81)
RDW: 13.8 % (ref 11.5–15.5)

## 2010-10-01 LAB — POCT I-STAT, CHEM 8
Chloride: 104 mEq/L (ref 96–112)
Creatinine, Ser: 1 mg/dL (ref 0.4–1.5)
Glucose, Bld: 110 mg/dL — ABNORMAL HIGH (ref 70–99)
HCT: 44 % (ref 39.0–52.0)
Hemoglobin: 15 g/dL (ref 13.0–17.0)
Potassium: 4 mEq/L (ref 3.5–5.1)
Sodium: 140 mEq/L (ref 135–145)

## 2010-10-01 LAB — DIFFERENTIAL
Eosinophils Absolute: 0 10*3/uL (ref 0.0–0.7)
Eosinophils Relative: 0 % (ref 0–5)
Lymphs Abs: 1.2 10*3/uL (ref 0.7–4.0)
Monocytes Absolute: 0.3 10*3/uL (ref 0.1–1.0)
Monocytes Relative: 6 % (ref 3–12)

## 2010-10-01 LAB — COMPREHENSIVE METABOLIC PANEL
ALT: 401 U/L — ABNORMAL HIGH (ref 0–53)
Albumin: 3.3 g/dL — ABNORMAL LOW (ref 3.5–5.2)
Albumin: 3.3 g/dL — ABNORMAL LOW (ref 3.5–5.2)
Alkaline Phosphatase: 58 U/L (ref 39–117)
Alkaline Phosphatase: 58 U/L (ref 39–117)
Alkaline Phosphatase: 67 U/L (ref 39–117)
Alkaline Phosphatase: 69 U/L (ref 39–117)
BUN: 3 mg/dL — ABNORMAL LOW (ref 6–23)
BUN: 4 mg/dL — ABNORMAL LOW (ref 6–23)
BUN: 4 mg/dL — ABNORMAL LOW (ref 6–23)
BUN: 4 mg/dL — ABNORMAL LOW (ref 6–23)
CO2: 27 mEq/L (ref 19–32)
CO2: 27 mEq/L (ref 19–32)
Calcium: 8.4 mg/dL (ref 8.4–10.5)
Calcium: 8.6 mg/dL (ref 8.4–10.5)
Calcium: 8.8 mg/dL (ref 8.4–10.5)
Chloride: 103 mEq/L (ref 96–112)
GFR calc non Af Amer: >60 mL/min (ref >60)
Glucose, Bld: 102 mg/dL — ABNORMAL HIGH (ref 70–99)
Glucose, Bld: 92 mg/dL (ref 70–99)
Potassium: 3.2 mEq/L — ABNORMAL LOW (ref 3.5–5.1)
Potassium: 3.4 mEq/L — ABNORMAL LOW (ref 3.5–5.1)
Potassium: 3.7 mEq/L (ref 3.5–5.1)
Sodium: 137 mEq/L (ref 135–145)
Sodium: 140 mEq/L (ref 135–145)
Total Bilirubin: 0.6 mg/dL (ref 0.3–1.2)
Total Protein: 6.1 g/dL (ref 6.0–8.3)
Total Protein: 6.1 g/dL (ref 6.0–8.3)
Total Protein: 6.2 g/dL (ref 6.0–8.3)

## 2010-10-01 LAB — HEPATIC FUNCTION PANEL
ALT: 272 U/L — ABNORMAL HIGH (ref 0–53)
ALT: 506 U/L — ABNORMAL HIGH (ref 0–53)
ALT: 577 U/L — ABNORMAL HIGH (ref 0–53)
AST: 377 U/L — ABNORMAL HIGH (ref 0–37)
AST: 794 U/L — ABNORMAL HIGH (ref 0–37)
Albumin: 3.3 g/dL — ABNORMAL LOW (ref 3.5–5.2)
Albumin: 3.3 g/dL — ABNORMAL LOW (ref 3.5–5.2)
Albumin: 3.5 g/dL (ref 3.5–5.2)
Albumin: 3.5 g/dL (ref 3.5–5.2)
Alkaline Phosphatase: 47 U/L (ref 39–117)
Alkaline Phosphatase: 54 U/L (ref 39–117)
Alkaline Phosphatase: 59 U/L (ref 39–117)
Alkaline Phosphatase: 66 U/L (ref 39–117)
Alkaline Phosphatase: 71 U/L (ref 39–117)
Indirect Bilirubin: 0.9 mg/dL (ref 0.3–0.9)
Indirect Bilirubin: 1.1 mg/dL — ABNORMAL HIGH (ref 0.3–0.9)
Indirect Bilirubin: 1.2 mg/dL — ABNORMAL HIGH (ref 0.3–0.9)
Total Bilirubin: 0.8 mg/dL (ref 0.3–1.2)
Total Bilirubin: 0.8 mg/dL (ref 0.3–1.2)
Total Bilirubin: 1.2 mg/dL (ref 0.3–1.2)
Total Protein: 5.9 g/dL — ABNORMAL LOW (ref 6.0–8.3)
Total Protein: 6 g/dL (ref 6.0–8.3)

## 2010-10-01 LAB — GC/CHLAMYDIA PROBE AMP, GENITAL: GC Probe Amp, Genital: NEGATIVE

## 2010-10-01 LAB — PROTIME-INR
INR: 1.04 (ref 0.00–1.49)
INR: 1.09 (ref 0.00–1.49)
Prothrombin Time: 13 seconds (ref 11.6–15.2)
Prothrombin Time: 13.8 seconds (ref 11.6–15.2)
Prothrombin Time: 14.3 seconds (ref 11.6–15.2)

## 2010-10-01 LAB — BASIC METABOLIC PANEL
CO2: 26 mEq/L (ref 19–32)
Chloride: 102 mEq/L (ref 96–112)
Creatinine, Ser: 0.84 mg/dL (ref 0.4–1.5)
GFR calc Af Amer: >60 mL/min (ref >60)
Potassium: 3.9 mEq/L (ref 3.5–5.1)
Sodium: 140 mEq/L (ref 135–145)

## 2010-10-01 LAB — URINE CULTURE: Culture  Setup Time: 201109021058

## 2010-10-01 LAB — STOOL CULTURE

## 2010-10-01 LAB — RAPID URINE DRUG SCREEN, HOSP PERFORMED
Barbiturates: NOT DETECTED
Benzodiazepines: POSITIVE — AB

## 2010-10-01 LAB — URINALYSIS, ROUTINE W REFLEX MICROSCOPIC
Nitrite: NEGATIVE
Specific Gravity, Urine: 1.02 (ref 1.005–1.030)
Urobilinogen, UA: 1 mg/dL (ref 0.0–1.0)
pH: 7.5 (ref 5.0–8.0)

## 2010-10-01 LAB — URINE MICROSCOPIC-ADD ON

## 2010-10-01 LAB — OVA AND PARASITE EXAMINATION

## 2010-10-01 LAB — HEPATITIS PANEL, ACUTE
Hep A IgM: NEGATIVE
Hepatitis B Surface Ag: NEGATIVE

## 2010-10-01 LAB — FECAL LACTOFERRIN, QUANT: Fecal Lactoferrin: NEGATIVE

## 2010-10-01 LAB — LIPASE, BLOOD: Lipase: 22 U/L (ref 11–59)

## 2010-10-01 LAB — PHENYTOIN LEVEL, TOTAL: Phenytoin Lvl: 3.9 ug/mL — ABNORMAL LOW (ref 10.0–20.0)

## 2010-10-05 LAB — RAPID URINE DRUG SCREEN, HOSP PERFORMED
Amphetamines: NOT DETECTED
Barbiturates: NOT DETECTED
Opiates: NOT DETECTED

## 2010-10-05 LAB — COMPREHENSIVE METABOLIC PANEL
AST: 46 U/L — ABNORMAL HIGH (ref 0–37)
CO2: 26 mEq/L (ref 19–32)
Chloride: 103 mEq/L (ref 96–112)
Creatinine, Ser: 0.85 mg/dL (ref 0.4–1.5)
GFR calc Af Amer: >60 mL/min (ref >60)
GFR calc non Af Amer: >60 mL/min (ref >60)
Glucose, Bld: 81 mg/dL (ref 70–99)
Total Bilirubin: 0.7 mg/dL (ref 0.3–1.2)

## 2010-10-05 LAB — CBC
HCT: 45 % (ref 39.0–52.0)
Hemoglobin: 15.3 g/dL (ref 13.0–17.0)
MCV: 88.5 fL (ref 78.0–100.0)
RBC: 5.08 MIL/uL (ref 4.22–5.81)
WBC: 4.7 10*3/uL (ref 4.0–10.5)

## 2010-10-05 LAB — LIPASE, BLOOD: Lipase: 22 U/L (ref 11–59)

## 2010-10-05 LAB — DIFFERENTIAL
Basophils Absolute: 0 10*3/uL (ref 0.0–0.1)
Eosinophils Absolute: 0 10*3/uL (ref 0.0–0.7)
Eosinophils Relative: 0 % (ref 0–5)
Lymphocytes Relative: 33 % (ref 12–46)
Neutrophils Relative %: 61 % (ref 43–77)

## 2010-10-06 LAB — TYPE AND SCREEN: Antibody Screen: NEGATIVE

## 2010-10-06 LAB — URINALYSIS, ROUTINE W REFLEX MICROSCOPIC
Bilirubin Urine: NEGATIVE
Glucose, UA: NEGATIVE mg/dL
Hgb urine dipstick: NEGATIVE
Ketones, ur: NEGATIVE mg/dL
Specific Gravity, Urine: 1.014 (ref 1.005–1.030)
pH: 6.5 (ref 5.0–8.0)

## 2010-10-06 LAB — COMPREHENSIVE METABOLIC PANEL
AST: 39 U/L — ABNORMAL HIGH (ref 0–37)
Albumin: 3.9 g/dL (ref 3.5–5.2)
Alkaline Phosphatase: 53 U/L (ref 39–117)
BUN: 4 mg/dL — ABNORMAL LOW (ref 6–23)
CO2: 31 mEq/L (ref 19–32)
Chloride: 102 mEq/L (ref 96–112)
Creatinine, Ser: 0.94 mg/dL (ref 0.4–1.5)
GFR calc Af Amer: >60 mL/min (ref >60)
GFR calc non Af Amer: >60 mL/min (ref >60)
Potassium: 4.3 mEq/L (ref 3.5–5.1)
Total Bilirubin: 0.6 mg/dL (ref 0.3–1.2)

## 2010-10-06 LAB — BASIC METABOLIC PANEL
CO2: 34 mEq/L — ABNORMAL HIGH (ref 19–32)
Calcium: 8.7 mg/dL (ref 8.4–10.5)
Creatinine, Ser: 0.95 mg/dL (ref 0.4–1.5)
GFR calc Af Amer: >60 mL/min (ref >60)
GFR calc non Af Amer: >60 mL/min (ref >60)
Sodium: 134 mEq/L — ABNORMAL LOW (ref 135–145)

## 2010-10-06 LAB — ABO/RH: ABO/RH(D): O POS

## 2010-10-06 LAB — CBC
Hemoglobin: 13.5 g/dL (ref 13.0–17.0)
MCHC: 34.4 g/dL (ref 30.0–36.0)
MCHC: 34.7 g/dL (ref 30.0–36.0)
RBC: 4.36 MIL/uL (ref 4.22–5.81)
RBC: 4.86 MIL/uL (ref 4.22–5.81)
RDW: 13.6 % (ref 11.5–15.5)
WBC: 5.9 10*3/uL (ref 4.0–10.5)

## 2010-10-06 LAB — MRSA PCR SCREENING: MRSA by PCR: NEGATIVE

## 2010-10-07 LAB — CBC
Hemoglobin: 14.3 g/dL (ref 13.0–17.0)
MCV: 90 fL (ref 78.0–100.0)
RBC: 4.8 MIL/uL (ref 4.22–5.81)
WBC: 5.4 10*3/uL (ref 4.0–10.5)

## 2010-10-07 LAB — DIFFERENTIAL
Eosinophils Absolute: 0.2 10*3/uL (ref 0.0–0.7)
Lymphs Abs: 2.1 10*3/uL (ref 0.7–4.0)
Monocytes Absolute: 0.3 10*3/uL (ref 0.1–1.0)
Monocytes Relative: 6 % (ref 3–12)
Neutrophils Relative %: 50 % (ref 43–77)

## 2010-10-07 LAB — URINALYSIS, ROUTINE W REFLEX MICROSCOPIC
Nitrite: NEGATIVE
Specific Gravity, Urine: 1.01 (ref 1.005–1.030)
pH: 6 (ref 5.0–8.0)

## 2010-10-07 LAB — POCT I-STAT, CHEM 8
BUN: 5 mg/dL — ABNORMAL LOW (ref 6–23)
Chloride: 100 mEq/L (ref 96–112)
Glucose, Bld: 80 mg/dL (ref 70–99)
HCT: 44 % (ref 39.0–52.0)
Potassium: 3.8 mEq/L (ref 3.5–5.1)

## 2010-10-07 LAB — PROTIME-INR: INR: 0.97 (ref 0.00–1.49)

## 2010-10-07 LAB — APTT: aPTT: 29 seconds (ref 24–37)

## 2010-10-11 LAB — BASIC METABOLIC PANEL
Calcium: 9.5 mg/dL (ref 8.4–10.5)
GFR calc Af Amer: >60 mL/min (ref >60)
GFR calc non Af Amer: >60 mL/min (ref >60)
Sodium: 136 mEq/L (ref 135–145)

## 2010-10-11 LAB — DIFFERENTIAL
Basophils Absolute: 0 10*3/uL (ref 0.0–0.1)
Lymphocytes Relative: 26 % (ref 12–46)
Monocytes Absolute: 0.4 10*3/uL (ref 0.1–1.0)
Monocytes Relative: 8 % (ref 3–12)
Neutro Abs: 3.8 10*3/uL (ref 1.7–7.7)

## 2010-10-11 LAB — CBC
Hemoglobin: 15.4 g/dL (ref 13.0–17.0)
RBC: 5.16 MIL/uL (ref 4.22–5.81)
RDW: 14.1 % (ref 11.5–15.5)
WBC: 5.8 10*3/uL (ref 4.0–10.5)

## 2010-10-11 LAB — RAPID URINE DRUG SCREEN, HOSP PERFORMED
Benzodiazepines: NOT DETECTED
Cocaine: NOT DETECTED

## 2010-10-11 LAB — TRICYCLICS SCREEN, URINE: TCA Scrn: NOT DETECTED

## 2010-10-19 LAB — COMPREHENSIVE METABOLIC PANEL
Alkaline Phosphatase: 53 U/L (ref 39–117)
BUN: 12 mg/dL (ref 6–23)
GFR calc non Af Amer: >60 mL/min (ref >60)
Glucose, Bld: 62 mg/dL — ABNORMAL LOW (ref 70–99)
Potassium: 4.2 mEq/L (ref 3.5–5.1)
Total Bilirubin: 0.6 mg/dL (ref 0.3–1.2)
Total Protein: 7 g/dL (ref 6.0–8.3)

## 2010-10-19 LAB — SALICYLATE LEVEL: Salicylate Lvl: <4 mg/dL (ref 2.8–20.0)

## 2010-10-20 LAB — COMPREHENSIVE METABOLIC PANEL
Albumin: 4.3 g/dL (ref 3.5–5.2)
Alkaline Phosphatase: 83 U/L (ref 39–117)
BUN: 13 mg/dL (ref 6–23)
Calcium: 9.2 mg/dL (ref 8.4–10.5)
Glucose, Bld: 79 mg/dL (ref 70–99)
Potassium: 4.4 mEq/L (ref 3.5–5.1)
Sodium: 141 mEq/L (ref 135–145)
Total Protein: 7.8 g/dL (ref 6.0–8.3)

## 2010-10-20 LAB — CBC
Hemoglobin: 16.8 g/dL (ref 13.0–17.0)
MCHC: 33.4 g/dL (ref 30.0–36.0)
Platelets: 206 10*3/uL (ref 150–400)
RDW: 14.8 % (ref 11.5–15.5)

## 2010-10-20 LAB — DIFFERENTIAL
Lymphs Abs: 1.4 10*3/uL (ref 0.7–4.0)
Monocytes Absolute: 0.3 10*3/uL (ref 0.1–1.0)
Monocytes Relative: 6 % (ref 3–12)
Neutro Abs: 3.9 10*3/uL (ref 1.7–7.7)
Neutrophils Relative %: 69 % (ref 43–77)

## 2010-10-20 LAB — RAPID URINE DRUG SCREEN, HOSP PERFORMED
Amphetamines: NOT DETECTED
Barbiturates: NOT DETECTED
Benzodiazepines: NOT DETECTED
Cocaine: NOT DETECTED
Opiates: POSITIVE — AB

## 2010-10-20 LAB — ETHANOL: Alcohol, Ethyl (B): 111 mg/dL — ABNORMAL HIGH (ref 0–10)

## 2010-10-20 LAB — PHENYTOIN LEVEL, TOTAL: Phenytoin Lvl: 3.8 ug/mL — ABNORMAL LOW (ref 10.0–20.0)

## 2010-10-22 LAB — RAPID URINE DRUG SCREEN, HOSP PERFORMED
Amphetamines: NOT DETECTED
Tetrahydrocannabinol: NOT DETECTED

## 2010-10-22 LAB — COMPREHENSIVE METABOLIC PANEL
ALT: 30 U/L (ref 0–53)
AST: 57 U/L — ABNORMAL HIGH (ref 0–37)
Alkaline Phosphatase: 72 U/L (ref 39–117)
CO2: 25 mEq/L (ref 19–32)
Calcium: 9.1 mg/dL (ref 8.4–10.5)
Chloride: 95 mEq/L — ABNORMAL LOW (ref 96–112)
GFR calc Af Amer: >60 mL/min (ref >60)
GFR calc non Af Amer: >60 mL/min (ref >60)
Potassium: 3.7 mEq/L (ref 3.5–5.1)
Sodium: 136 mEq/L (ref 135–145)

## 2010-10-22 LAB — DIFFERENTIAL
Basophils Relative: 0 % (ref 0–1)
Eosinophils Absolute: 0 10*3/uL (ref 0.0–0.7)
Eosinophils Relative: 0 % (ref 0–5)
Lymphs Abs: 1 10*3/uL (ref 0.7–4.0)

## 2010-10-22 LAB — CBC
Hemoglobin: 14.8 g/dL (ref 13.0–17.0)
MCHC: 33.7 g/dL (ref 30.0–36.0)
RBC: 5.16 MIL/uL (ref 4.22–5.81)
WBC: 5.4 10*3/uL (ref 4.0–10.5)

## 2010-10-22 LAB — ETHANOL: Alcohol, Ethyl (B): <5 mg/dL (ref 0–10)

## 2010-10-22 LAB — PHENYTOIN LEVEL, TOTAL: Phenytoin Lvl: <2.5 ug/mL — ABNORMAL LOW (ref 10.0–20.0)

## 2010-10-24 LAB — COMPREHENSIVE METABOLIC PANEL
Albumin: 3.7 g/dL (ref 3.5–5.2)
BUN: 3 mg/dL — ABNORMAL LOW (ref 6–23)
Creatinine, Ser: 0.92 mg/dL (ref 0.4–1.5)
Potassium: 4 mEq/L (ref 3.5–5.1)
Total Protein: 6.2 g/dL (ref 6.0–8.3)

## 2010-10-24 LAB — CBC
HCT: 31.3 % — ABNORMAL LOW (ref 39.0–52.0)
HCT: 40.3 % (ref 39.0–52.0)
HCT: 40.7 % (ref 39.0–52.0)
Hemoglobin: 10.6 g/dL — ABNORMAL LOW (ref 13.0–17.0)
Hemoglobin: 13.4 g/dL (ref 13.0–17.0)
MCHC: 33.3 g/dL (ref 30.0–36.0)
MCV: 88.6 fL (ref 78.0–100.0)
Platelets: 166 10*3/uL (ref 150–400)
Platelets: 181 10*3/uL (ref 150–400)
RDW: 12.6 % (ref 11.5–15.5)
RDW: 12.6 % (ref 11.5–15.5)
RDW: 12.9 % (ref 11.5–15.5)
RDW: 12.9 % (ref 11.5–15.5)

## 2010-10-24 LAB — BASIC METABOLIC PANEL
BUN: 5 mg/dL — ABNORMAL LOW (ref 6–23)
Calcium: 8.3 mg/dL — ABNORMAL LOW (ref 8.4–10.5)
GFR calc non Af Amer: >60 mL/min (ref >60)
Glucose, Bld: 113 mg/dL — ABNORMAL HIGH (ref 70–99)
Potassium: 3.5 mEq/L (ref 3.5–5.1)

## 2010-10-24 LAB — DIFFERENTIAL
Lymphocytes Relative: 22 % (ref 12–46)
Lymphs Abs: 1.3 10*3/uL (ref 0.7–4.0)
Monocytes Absolute: 0.5 10*3/uL (ref 0.1–1.0)
Monocytes Relative: 8 % (ref 3–12)
Neutro Abs: 4.1 10*3/uL (ref 1.7–7.7)

## 2010-10-28 LAB — POCT I-STAT, CHEM 8
Calcium, Ion: 1.08 mmol/L — ABNORMAL LOW (ref 1.12–1.32)
Creatinine, Ser: 0.9 mg/dL (ref 0.4–1.5)
Glucose, Bld: 91 mg/dL (ref 70–99)
Hemoglobin: 12.6 g/dL — ABNORMAL LOW (ref 13.0–17.0)
Potassium: 3.4 mEq/L — ABNORMAL LOW (ref 3.5–5.1)

## 2010-10-28 LAB — CBC
HCT: 36 % — ABNORMAL LOW (ref 39.0–52.0)
Hemoglobin: 12.2 g/dL — ABNORMAL LOW (ref 13.0–17.0)
Hemoglobin: 12.2 g/dL — ABNORMAL LOW (ref 13.0–17.0)
MCHC: 33.9 g/dL (ref 30.0–36.0)
MCV: 95.5 fL (ref 78.0–100.0)
MCV: 93.4 fL (ref 78.0–100.0)
Platelets: 110 10*3/uL — ABNORMAL LOW (ref 150–400)
Platelets: 78 10*3/uL — ABNORMAL LOW (ref 150–400)
RBC: 3.77 MIL/uL — ABNORMAL LOW (ref 4.22–5.81)
RBC: 4.3 MIL/uL (ref 4.22–5.81)
RDW: 17.1 % — ABNORMAL HIGH (ref 11.5–15.5)
RDW: 17.1 % — ABNORMAL HIGH (ref 11.5–15.5)
WBC: 6.5 10*3/uL (ref 4.0–10.5)
WBC: 3.4 10*3/uL — ABNORMAL LOW (ref 4.0–10.5)

## 2010-10-28 LAB — DIFFERENTIAL
Basophils Absolute: 0 10*3/uL (ref 0.0–0.1)
Basophils Relative: 0 % (ref 0–1)
Basophils Relative: 1 % (ref 0–1)
Basophils Relative: 1 % (ref 0–1)
Eosinophils Absolute: 0.1 10*3/uL (ref 0.0–0.7)
Eosinophils Absolute: 0 10*3/uL (ref 0.0–0.7)
Eosinophils Relative: 0 % (ref 0–5)
Eosinophils Relative: 1 % (ref 0–5)
Eosinophils Relative: 0 % (ref 0–5)
Lymphocytes Relative: 14 % (ref 12–46)
Monocytes Absolute: 0.4 10*3/uL (ref 0.1–1.0)
Monocytes Absolute: 0.5 10*3/uL (ref 0.1–1.0)
Monocytes Absolute: 0.4 10*3/uL (ref 0.1–1.0)
Monocytes Relative: 8 % (ref 3–12)
Monocytes Relative: 8 % (ref 3–12)
Neutro Abs: 3.4 10*3/uL (ref 1.7–7.7)
Neutro Abs: 5.1 10*3/uL (ref 1.7–7.7)

## 2010-10-28 LAB — COMPREHENSIVE METABOLIC PANEL
ALT: 46 U/L (ref 0–53)
ALT: 89 U/L — ABNORMAL HIGH (ref 0–53)
ALT: 117 U/L — ABNORMAL HIGH (ref 0–53)
AST: 127 U/L — ABNORMAL HIGH (ref 0–37)
AST: 213 U/L — ABNORMAL HIGH (ref 0–37)
Albumin: 2.8 g/dL — ABNORMAL LOW (ref 3.5–5.2)
Albumin: 3.9 g/dL (ref 3.5–5.2)
Alkaline Phosphatase: 40 U/L (ref 39–117)
Alkaline Phosphatase: 52 U/L (ref 39–117)
Alkaline Phosphatase: 50 U/L (ref 39–117)
CO2: 26 mEq/L (ref 19–32)
Chloride: 99 mEq/L (ref 96–112)
Chloride: 102 mEq/L (ref 96–112)
Creatinine, Ser: 0.84 mg/dL (ref 0.4–1.5)
GFR calc Af Amer: >60 mL/min (ref >60)
GFR calc Af Amer: >60 mL/min (ref >60)
GFR calc non Af Amer: >60 mL/min (ref >60)
Potassium: 3.7 mEq/L (ref 3.5–5.1)
Potassium: 4.2 mEq/L (ref 3.5–5.1)
Potassium: 3.7 mEq/L (ref 3.5–5.1)
Sodium: 135 mEq/L (ref 135–145)
Sodium: 138 mEq/L (ref 135–145)
Total Bilirubin: 0.9 mg/dL (ref 0.3–1.2)
Total Protein: 5.3 g/dL — ABNORMAL LOW (ref 6.0–8.3)
Total Protein: 7 g/dL (ref 6.0–8.3)

## 2010-10-28 LAB — URINALYSIS, ROUTINE W REFLEX MICROSCOPIC
Bilirubin Urine: NEGATIVE
Hgb urine dipstick: NEGATIVE
Hgb urine dipstick: NEGATIVE
Hgb urine dipstick: NEGATIVE
Nitrite: NEGATIVE
Nitrite: NEGATIVE
Specific Gravity, Urine: 1.018 (ref 1.005–1.030)
Specific Gravity, Urine: 1.019 (ref 1.005–1.030)
Specific Gravity, Urine: 1.031 — ABNORMAL HIGH (ref 1.005–1.030)
Urobilinogen, UA: 4 mg/dL — ABNORMAL HIGH (ref 0.0–1.0)
Urobilinogen, UA: 4 mg/dL — ABNORMAL HIGH (ref 0.0–1.0)
pH: 8 (ref 5.0–8.0)

## 2010-10-28 LAB — RAPID URINE DRUG SCREEN, HOSP PERFORMED
Amphetamines: NOT DETECTED
Amphetamines: NOT DETECTED
Amphetamines: NOT DETECTED
Barbiturates: NOT DETECTED
Barbiturates: NOT DETECTED
Cocaine: NOT DETECTED
Opiates: NOT DETECTED
Opiates: NOT DETECTED
Tetrahydrocannabinol: NOT DETECTED
Tetrahydrocannabinol: NOT DETECTED

## 2010-10-28 LAB — ETHANOL
Alcohol, Ethyl (B): <5 mg/dL (ref 0–10)
Alcohol, Ethyl (B): 86 mg/dL — ABNORMAL HIGH (ref 0–10)

## 2010-10-28 LAB — PHENYTOIN LEVEL, TOTAL: Phenytoin Lvl: <2.5 ug/mL — ABNORMAL LOW (ref 10.0–20.0)

## 2010-10-28 LAB — URINE MICROSCOPIC-ADD ON

## 2010-10-29 LAB — DIFFERENTIAL
Basophils Relative: 0 % (ref 0–1)
Eosinophils Relative: 0 % (ref 0–5)
Monocytes Absolute: 0.3 10*3/uL (ref 0.1–1.0)
Monocytes Relative: 7 % (ref 3–12)
Neutro Abs: 3.3 10*3/uL (ref 1.7–7.7)

## 2010-10-29 LAB — CBC
Hemoglobin: 13 g/dL (ref 13.0–17.0)
MCHC: 34.3 g/dL (ref 30.0–36.0)
MCV: 94.3 fL (ref 78.0–100.0)
RDW: 16.3 % — ABNORMAL HIGH (ref 11.5–15.5)

## 2010-10-29 LAB — URINALYSIS, ROUTINE W REFLEX MICROSCOPIC
Bilirubin Urine: NEGATIVE
Glucose, UA: NEGATIVE mg/dL
Ketones, ur: >80 mg/dL — AB
Protein, ur: >300 mg/dL — AB
Urobilinogen, UA: 1 mg/dL (ref 0.0–1.0)

## 2010-10-29 LAB — COMPREHENSIVE METABOLIC PANEL
ALT: 109 U/L — ABNORMAL HIGH (ref 0–53)
AST: 188 U/L — ABNORMAL HIGH (ref 0–37)
Albumin: 4 g/dL (ref 3.5–5.2)
Alkaline Phosphatase: 50 U/L (ref 39–117)
BUN: 8 mg/dL (ref 6–23)
GFR calc Af Amer: >60 mL/min (ref >60)
Potassium: 3.7 mEq/L (ref 3.5–5.1)
Sodium: 139 mEq/L (ref 135–145)
Total Protein: 6.7 g/dL (ref 6.0–8.3)

## 2010-10-29 LAB — RAPID URINE DRUG SCREEN, HOSP PERFORMED
Amphetamines: NOT DETECTED
Barbiturates: NOT DETECTED
Cocaine: NOT DETECTED
Opiates: NOT DETECTED
Tetrahydrocannabinol: NOT DETECTED

## 2010-11-02 LAB — HIV ANTIBODY (ROUTINE TESTING W REFLEX): HIV: NONREACTIVE

## 2010-11-02 LAB — DIFFERENTIAL
Eosinophils Absolute: 0 10*3/uL (ref 0.0–0.7)
Lymphocytes Relative: 15 % (ref 12–46)
Lymphocytes Relative: 17 % (ref 12–46)
Lymphs Abs: 0.5 10*3/uL — ABNORMAL LOW (ref 0.7–4.0)
Lymphs Abs: 0.6 10*3/uL — ABNORMAL LOW (ref 0.7–4.0)
Monocytes Relative: 9 % (ref 3–12)
Neutrophils Relative %: 69 % (ref 43–77)
Neutrophils Relative %: 75 % (ref 43–77)

## 2010-11-02 LAB — CBC
MCHC: 34.3 g/dL (ref 30.0–36.0)
MCV: 93.5 fL (ref 78.0–100.0)
MCV: 94 fL (ref 78.0–100.0)
RBC: 4.31 MIL/uL (ref 4.22–5.81)
RBC: 4.48 MIL/uL (ref 4.22–5.81)
RDW: 15.5 % (ref 11.5–15.5)
WBC: 3.4 10*3/uL — ABNORMAL LOW (ref 4.0–10.5)

## 2010-11-02 LAB — COMPREHENSIVE METABOLIC PANEL
CO2: 30 mEq/L (ref 19–32)
Calcium: 9.1 mg/dL (ref 8.4–10.5)
Creatinine, Ser: 1.01 mg/dL (ref 0.4–1.5)
GFR calc Af Amer: >60 mL/min (ref >60)
GFR calc non Af Amer: >60 mL/min (ref >60)
Glucose, Bld: 94 mg/dL (ref 70–99)

## 2010-11-02 LAB — URINALYSIS, ROUTINE W REFLEX MICROSCOPIC
Glucose, UA: NEGATIVE mg/dL
Glucose, UA: NEGATIVE mg/dL
Leukocytes, UA: NEGATIVE
Protein, ur: 100 mg/dL — AB
Specific Gravity, Urine: 1.013 (ref 1.005–1.030)
Specific Gravity, Urine: 1.016 (ref 1.005–1.030)
pH: 7.5 (ref 5.0–8.0)
pH: 7.5 (ref 5.0–8.0)

## 2010-11-02 LAB — URINE MICROSCOPIC-ADD ON

## 2010-11-02 LAB — RAPID URINE DRUG SCREEN, HOSP PERFORMED
Amphetamines: NOT DETECTED
Barbiturates: NOT DETECTED
Barbiturates: NOT DETECTED
Opiates: POSITIVE — AB

## 2010-11-02 LAB — BASIC METABOLIC PANEL
Chloride: 94 mEq/L — ABNORMAL LOW (ref 96–112)
Creatinine, Ser: 1.02 mg/dL (ref 0.4–1.5)
GFR calc Af Amer: >60 mL/min (ref >60)
Potassium: 3.5 mEq/L (ref 3.5–5.1)

## 2010-11-02 LAB — ETHANOL: Alcohol, Ethyl (B): <5 mg/dL (ref 0–10)

## 2010-11-02 LAB — LIPID PANEL
HDL: 75 mg/dL (ref >39)
LDL Cholesterol: 155 mg/dL — ABNORMAL HIGH (ref 0–99)
Triglycerides: 45 mg/dL (ref <150)
VLDL: 9 mg/dL (ref 0–40)

## 2010-11-05 ENCOUNTER — Emergency Department (HOSPITAL_COMMUNITY)
Admission: EM | Admit: 2010-11-05 | Discharge: 2010-11-06 | Disposition: A | Discharge status: Other Acute Inpatient Hospital | Payer: Self-pay | Attending: Emergency Medicine | Admitting: Emergency Medicine

## 2010-11-05 DIAGNOSIS — F1010 Alcohol abuse, uncomplicated: Secondary | ICD-10-CM | POA: Insufficient documentation

## 2010-11-05 DIAGNOSIS — R748 Abnormal levels of other serum enzymes: Secondary | ICD-10-CM | POA: Insufficient documentation

## 2010-11-05 DIAGNOSIS — I10 Essential (primary) hypertension: Secondary | ICD-10-CM | POA: Insufficient documentation

## 2010-11-05 DIAGNOSIS — E7800 Pure hypercholesterolemia, unspecified: Secondary | ICD-10-CM | POA: Insufficient documentation

## 2010-11-05 DIAGNOSIS — R45851 Suicidal ideations: Secondary | ICD-10-CM | POA: Insufficient documentation

## 2010-11-05 DIAGNOSIS — F3289 Other specified depressive episodes: Secondary | ICD-10-CM | POA: Insufficient documentation

## 2010-11-05 DIAGNOSIS — G40909 Epilepsy, unspecified, not intractable, without status epilepticus: Secondary | ICD-10-CM | POA: Insufficient documentation

## 2010-11-05 DIAGNOSIS — F329 Major depressive disorder, single episode, unspecified: Secondary | ICD-10-CM | POA: Insufficient documentation

## 2010-11-05 LAB — CBC
MCH: 29.4 pg (ref 26.0–34.0)
MCV: 83.1 fL (ref 78.0–100.0)
Platelets: 155 10*3/uL (ref 150–400)
RBC: 5.31 MIL/uL (ref 4.22–5.81)
RDW: 13.5 % (ref 11.5–15.5)

## 2010-11-05 LAB — URINE MICROSCOPIC-ADD ON

## 2010-11-05 LAB — URINALYSIS, ROUTINE W REFLEX MICROSCOPIC
Glucose, UA: NEGATIVE mg/dL
Leukocytes, UA: NEGATIVE
Nitrite: NEGATIVE
Protein, ur: 100 mg/dL — AB
pH: 6 (ref 5.0–8.0)

## 2010-11-05 LAB — COMPREHENSIVE METABOLIC PANEL
Alkaline Phosphatase: 65 U/L (ref 39–117)
BUN: 11 mg/dL (ref 6–23)
Calcium: 8.9 mg/dL (ref 8.4–10.5)
Glucose, Bld: 65 mg/dL — ABNORMAL LOW (ref 70–99)
Total Protein: 7.1 g/dL (ref 6.0–8.3)

## 2010-11-05 LAB — DIFFERENTIAL
Basophils Relative: 0 % (ref 0–1)
Eosinophils Absolute: 0 10*3/uL (ref 0.0–0.7)
Eosinophils Relative: 0 % (ref 0–5)
Lymphs Abs: 1.7 10*3/uL (ref 0.7–4.0)
Monocytes Relative: 7 % (ref 3–12)
Neutrophils Relative %: 65 % (ref 43–77)

## 2010-11-05 LAB — LIPASE, BLOOD: Lipase: 16 U/L (ref 11–59)

## 2010-11-05 LAB — RAPID URINE DRUG SCREEN, HOSP PERFORMED: Tetrahydrocannabinol: NOT DETECTED

## 2010-11-05 LAB — PROTIME-INR: Prothrombin Time: 13.2 seconds (ref 11.6–15.2)

## 2010-11-06 ENCOUNTER — Inpatient Hospital Stay (HOSPITAL_COMMUNITY)
Admission: AD | Admit: 2010-11-06 | Discharge: 2010-11-09 | DRG: 885 | Disposition: A | Discharge status: Home / Self Care | Payer: Self-pay | Source: Ambulatory Visit | Attending: Psychiatry | Admitting: Psychiatry

## 2010-11-06 DIAGNOSIS — F39 Unspecified mood [affective] disorder: Principal | ICD-10-CM

## 2010-11-06 DIAGNOSIS — G40909 Epilepsy, unspecified, not intractable, without status epilepticus: Secondary | ICD-10-CM

## 2010-11-06 DIAGNOSIS — F640 Transsexualism: Secondary | ICD-10-CM

## 2010-11-06 DIAGNOSIS — F1020 Alcohol dependence, uncomplicated: Secondary | ICD-10-CM

## 2010-11-06 DIAGNOSIS — E7800 Pure hypercholesterolemia, unspecified: Secondary | ICD-10-CM

## 2010-11-06 DIAGNOSIS — R45851 Suicidal ideations: Secondary | ICD-10-CM

## 2010-11-06 DIAGNOSIS — I10 Essential (primary) hypertension: Secondary | ICD-10-CM

## 2010-11-06 DIAGNOSIS — Z818 Family history of other mental and behavioral disorders: Secondary | ICD-10-CM

## 2010-11-06 DIAGNOSIS — Z880 Allergy status to penicillin: Secondary | ICD-10-CM

## 2010-11-07 DIAGNOSIS — F39 Unspecified mood [affective] disorder: Secondary | ICD-10-CM

## 2010-11-07 DIAGNOSIS — F1020 Alcohol dependence, uncomplicated: Secondary | ICD-10-CM

## 2010-11-07 LAB — URINALYSIS, ROUTINE W REFLEX MICROSCOPIC
Ketones, ur: NEGATIVE mg/dL
Nitrite: NEGATIVE
Specific Gravity, Urine: 1.009 (ref 1.005–1.030)
pH: 7.5 (ref 5.0–8.0)

## 2010-11-07 NOTE — Consult Note (Signed)
  [name], [name]                 ACCOUNT NO.:  [id number]  MEDICAL RECORD NO.:  [id number]           PATIENT TYPE:  E  LOCATION:  WLED                         FACILITY:  [facility]  PHYSICIAN:  Eulogio Ditch, MD DATE OF BIRTH:  1981/03/12  DATE OF CONSULTATION:  11/06/2010 DATE OF DISCHARGE:                                CONSULTATION   REASON FOR CONSULTATION:  Depression, hearing voices.  HISTORY OF PRESENT ILLNESS:  A 30 year old African-American male with history of alcohol abuse who relapsed on the alcohol and after abusing alcohol, he started hearing voices and became depressed and had suicidal ideations.  Currently, the patient is very logical and goal directed. Denies hearing voices but still has suicidal thoughts on and off.  The patient told me that he was put on Fanapt, and Abilify was discontinued but he wants to be back on Abilify and BuSpar, the same combination we gave him last time.  The patient has multiple hospitalizations in the past with numerous suicide gestures and attempts.  PAST MEDICAL HISTORY:  The patient has a history of hypertension and a seizure disorder.  ALLERGIES:  Allergic to PENICILLIN and MORPHINE.  MENTAL STATUS EXAMINATION:  Calm, cooperative during interview.  Fair eye contact, pleasant on approach.  Mood depressed.  Affect mood congruent.  Thought process is logical and goal directed.  Thought content, has suicidal ideations on and off but without any active plan. Currently, not delusional.  Denies hearing any voices, not internally preoccupied.  Cognition, alert, awake, oriented x 3.  Memory, immediate, recent, remote fair.  Attention and concentration fair.  Abstraction ability fair.  Insight and judgment fair.  DIAGNOSES:  AXIS I:  Chronic alcohol dependence.  Mood disorder, NOS, rule out bipolar disorder. AXIS II:  Deferred. AXIS III:  Hypertension and seizure disorder. AXIS IV:  Chronic alcohol abuse. AXIS V:  40 to  50.  RECOMMENDATIONS:  The patient will be continued on Dilantin 100 mg twice a day, Colace 100 mg p.o. daily, Neurontin 200 mg during the day and 300 at bedtime, Abilify 10 mg daily, trazodone 300 mg at bedtime, Celexa 20 mg p.o. daily, BuSpar 7.5 mg twice a day.  The patient is himself in the ER and we will see by tomorrow how he is doing.  If he still has suicidal ideation, we will admit him to [facility].     Eulogio Ditch, MD     SA/MEDQ  D:  11/06/2010  T:  11/06/2010  Job:  [id number]  Electronically Signed by Eulogio Ditch  on 11/07/2010 05:58:10 AM

## 2010-11-09 NOTE — Discharge Summary (Signed)
[name], [name]                 ACCOUNT NO.:  [id number]  MEDICAL RECORD NO.:  [id number]           PATIENT TYPE:  I  LOCATION:  0504                          [facility]ILITY[facility]  PHYSICIAN:  Franchot Gallo, MD     DATE OF BIRTH:  12-17-1980  DATE OF ADMISSION:  11/06/2010 DATE OF DISCHARGE:                              DISCHARGE SUMMARY   REASON FOR ADMISSION:  This is a 30 year old male that was reporting suicidal thoughts after he started back drinking.  He had been off his medications for some period of time.  Also reporting auditory hallucinations, command in nature, telling him that he is no good.  FINAL IMPRESSIONS:  Axis I:  Mood disorder not otherwise specified. Alcohol relapse versus dependence. Axis II:  Rule out gender identification issues. Axis III:  History of hypertension, seizure disorder, and high cholesterol. Axis IV:  Chronic mental health and substance abuse issues, noncompliance with medications. Axis V: 50 to 55.  SIGNIFICANT LABORATORIES:  Alcohol level on admission was 149.  His blood sugar was 65.  CBC is within normal limits.  Urine drug screen was negative.  SIGNIFICANT FINDINGS:  This was a cooperative male.  Fair eye contact. Speech is clear.  Reporting that he still felt somewhat suicidal.  He denied any hallucinations or homicidal thoughts.  No psychosis was noted.  He was admitted to the adult milieu.  The patient reported that he would benefit from his medications and they were restarted and will work with him to develop a sobriety plan.  The patient improved.  He felt that he was feeling much better back on his medications.  He was participating in group, reporting that he had a good supportive family and has bene living in his parents' home for 7 years.  He was excited about going back to work.  He denied any suicidal thoughts.  We contacted the patient's parents to gather collateral information and provide information and to address any  safety issues.  They were fine with the patient returning home with them.  The patient again was doing good.  He was less depressed.  He states he is feeling much better on his medications, Abilify and BuSpar.  Talked openly about his recent alcohol use and asking appropriate questions.  On the day of discharge, the patient was sleeping well with improved appetite and improvement in his depressive symptoms.  He denied any suicidal or homicidal thoughts or auditory hallucinations and the patient was stable for discharge.  DISCHARGE MEDICATIONS: 1. Abilify 10 mg one daily. 2. BuSpar 5 mg taken 1-1/2 twice daily. 3. Celexa 20 mg 1 tablet daily. 4. Gabapentin 300 mg b.i.d. 5. Trazodone 100 mg taken 3 q.h.s. p.r.n. as needed for sleep. 6. Atenolol 50 mg 1/2 tablet daily. 7. Crestor 40 mg daily. 8. Phenytoin 100 mg 2 capsules b.i.d.  We provided prescriptions and a short supply of his medications.  FOLLOWUP:  With [name] on Friday, November 13, 2010, at 1 p.m., phone number [phone].     Landry Corporal, N.P.   ______________________________ Franchot Gallo, MD    JO/MEDQ  D:  11/09/2010  T:  11/09/2010  Job:  [id number]  Electronically Signed by Limmie Patricia.P. on 11/09/2010 04:12:23 PM Electronically Signed by Franchot Gallo MD on 11/09/2010 05:25:14 PM

## 2010-11-14 NOTE — H&P (Signed)
NAMEDETROIT, FRIEDEN                 ACCOUNT NO.:  [id number]  MEDICAL RECORD NO.:  [id number]           PATIENT TYPE:  I  LOCATION:  0504                          [facility]  PHYSICIAN:  Franchot Gallo, MD     DATE OF BIRTH:  [birth year]  DATE OF ADMISSION:  11/06/2010 DATE OF DISCHARGE:  11/09/2010                      PSYCHIATRIC ADMISSION ASSESSMENT   This is a voluntary admission to the services of Dr. Harvie Heck Amoy Steeves. Today's date is November 07, 2010.  This is a 30 year old single [place] American male.  He presented to [facility] ED.  He was reporting suicidal ideation after he had started back drinking and he also reported hematemesis because he was drinking.  He states that he did have a plan to overdose on his prescribed meds.  So initially when he came to the emergency room he stated that he was vomiting blood then he reported he was suicidal with a plan to overdose on his pills.  He relapsed on alcohol a week ago and he stopped taking prescribed meds at that time.  He reported auditory hallucinations command in nature to harm himself stating that he is no good and no one loves him and also complaining of panic attacks, his most recent being on April 19.  He reports numerous prior gestures.  He was here with [place] in October of 2011.  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  He is currently followed at [facility] which is now going by the name [facility]. He is distressed that his new psychiatrist changed his medications, deleting some and he states this was back in February and this is what has caused his relapse now April 20.  We note that he reports detoxes as early as 2006.  He has been to [place], to [facility].  He has been here a few times, apparently [facility] and he has done outpatient at the [facility] as well.  SOCIAL HISTORY:  He reports a GED, 1 year of college.  He has  never married.  He has no children.  No current relationships.  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  FAMILY HISTORY:  He reports his father and 2 of his father's sisters had depression.  His mom used to have panic attacks.  ALCOHOL AND DRUG HISTORY:  He reports using alcohol since age 76.  He went on an alcoholic binge last week.  As stated his alcohol level on admission was 149.  PRIMARY CARE PROVIDER:  Dr. Delrae Alfred at [facility].  He is followed at [facility] which is now going by the name [facility].  MEDICAL PROBLEMS:  He is known to have hypertension, seizures, high cholesterol.  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  MEDICATIONS:  His currently prescribed medications or at least ones we restarted were: 1. Colace 100 mg p.o. b.i.d. 2. Neurontin 200 mg p.o. daily, 300 at h.s. 3. Abilify 10 mg p.o. daily. 4. Celexa 20 mg p.o.  daily. 5. BuSpar 7.5 mg b.i.d. 6. Trazodone 300 mg at bedtime. 7. He is also supposed to be taking Crestor 40 mg p.o. daily. 8. Phenytoin 200 mg b.i.d.  ALLERGIES:  Drug allergies are to penicillin and morphine.  POSITIVE PHYSICAL FINDINGS:  He was medically cleared in the ED at [facility].  He was afebrile.  His temperature was 97.8 up to 98.8. His respirations were 16-20.  His pulse ranged from 68-107 and his blood pressure went from 123/81 to 145/89.  He had a small amount of hemoglobin in his urine.  Will recheck that.  He was negative for any substances in his urine drug screen.  His alcohol level was 149 and his SGOT was elevated at 65.  MENTAL STATUS EXAM:  He was seen in conjunction with Dr. Lolly Mustache.  He was cooperative.  He had fair eye contact.  His speech was clear.  He still reported that he felt somewhat suicidal.  He denied any auditory or visual hallucinations, any homicidal ideation.  There was no psychosis noted.  Attention and concentration appeared to be  intact as was insight and judgment.  DIAGNOSES:  AXIS I:  Mood disorder not otherwise specified, alcohol relapse versus dependence. AXIS II:  Rule out gender identification issues. AXIS III:  Hypertension, seizure disorder, high cholesterol. AXIS IV:  Chronic mental health and substance abuse issues. AXIS V:  30.  PLAN:  To admit for safety and stabilization.  As he felt to benefit from Abilify the Abilify was restarted and we will work with him regarding developing a sobriety plan.  Estimated length of stay is 3-5 days.     Mickie Leonarda Salon, P.A.-C.   ______________________________ Franchot Gallo, MD    MD/MEDQ  D:  11/07/2010  T:  11/07/2010  Job:  [id number]  Electronically Signed by Jaci Lazier ADAMS P.A.-C. on 11/14/2010 03:37:07 PM Electronically Signed by Franchot Gallo MD on 11/14/2010 05:51:25 PM

## 2010-11-24 ENCOUNTER — Emergency Department (HOSPITAL_COMMUNITY)
Admission: EM | Admit: 2010-11-24 | Discharge: 2010-11-24 | Disposition: A | Discharge status: Home / Self Care | Payer: Self-pay | Attending: Emergency Medicine | Admitting: Emergency Medicine

## 2010-11-24 DIAGNOSIS — E7800 Pure hypercholesterolemia, unspecified: Secondary | ICD-10-CM | POA: Insufficient documentation

## 2010-11-24 DIAGNOSIS — Z79899 Other long term (current) drug therapy: Secondary | ICD-10-CM | POA: Insufficient documentation

## 2010-11-24 DIAGNOSIS — I10 Essential (primary) hypertension: Secondary | ICD-10-CM | POA: Insufficient documentation

## 2010-11-24 DIAGNOSIS — G40909 Epilepsy, unspecified, not intractable, without status epilepticus: Secondary | ICD-10-CM | POA: Insufficient documentation

## 2010-11-24 DIAGNOSIS — R112 Nausea with vomiting, unspecified: Secondary | ICD-10-CM | POA: Insufficient documentation

## 2010-11-24 DIAGNOSIS — F3130 Bipolar disorder, current episode depressed, mild or moderate severity, unspecified: Secondary | ICD-10-CM | POA: Insufficient documentation

## 2010-11-24 DIAGNOSIS — F1010 Alcohol abuse, uncomplicated: Secondary | ICD-10-CM | POA: Insufficient documentation

## 2010-11-24 LAB — BASIC METABOLIC PANEL
CO2: 31 mEq/L (ref 19–32)
Calcium: 9.6 mg/dL (ref 8.4–10.5)
GFR calc Af Amer: >60 mL/min (ref >60)
GFR calc non Af Amer: >60 mL/min (ref >60)
Sodium: 136 mEq/L (ref 135–145)

## 2010-11-24 LAB — DIFFERENTIAL
Basophils Absolute: 0 10*3/uL (ref 0.0–0.1)
Basophils Relative: 0 % (ref 0–1)
Eosinophils Absolute: 0 10*3/uL (ref 0.0–0.7)
Monocytes Absolute: 0.6 10*3/uL (ref 0.1–1.0)
Neutro Abs: 4.3 10*3/uL (ref 1.7–7.7)

## 2010-11-24 LAB — CBC
Hemoglobin: 15.4 g/dL (ref 13.0–17.0)
MCHC: 35.6 g/dL (ref 30.0–36.0)
Platelets: 200 10*3/uL (ref 150–400)
RDW: 14.1 % (ref 11.5–15.5)

## 2010-12-01 NOTE — H&P (Signed)
[name], [name]                 ACCOUNT NO.:  [id number]   MEDICAL RECORD NO.:  [id number]          PATIENT TYPE:  INP   LOCATION:  1530                         FACILITY:  [facility]   PHYSICIAN:  Lonia Blood, M.D.      DATE OF BIRTH:  03/16/1981   DATE OF ADMISSION:  07/24/2008  DATE OF DISCHARGE:                              HISTORY & PHYSICAL   PRIMARY CARE PHYSICIAN:  The patient is unassigned.   PRESENTING COMPLAINT:  Seizure.   HISTORY OF THE PRESENT ILLNESS:  The patient is a 30 year old  transgender who apparently was seen in the ER earlier this morning with  alcohol withdrawal.  He was transferred to [facility] for alcohol  rehab.  On arrival the patient had seizure episodes at least twice.  He  was subsequently transferred back to the emergency room.  So far, the  [facility] for has refused to take him back at this point without  medical clearance.  The patient is currently awake, alert and oriented  with no specific complaints.  He has not had anymore episodes of  seizures here in the emergency room.  He denies any headache.  There are  no focal neurological findings; and, no prior history of seizures.   PAST MEDICAL HISTORY:  The patient's past medical history is significant  for:  1. Alcoholism with multiple alcohol withdrawal.  2. Polysubstance abuse including cocaine.  3. Tobacco abuse.  4. Hypertension.  5. Dyslipidemia.  6. Anxiety.  7. Depression.  8. Transgender.   ALLERGIES:  THE PATIENT IS ALLERGIC TO PENICILLIN.   MEDICATIONS:  1. Crestor 5 mg daily.  2. Atenolol 25 mg daily.  3. Benadryl 25 mg every 6 hours as needed.  4. Lorazepam 1 mg every 8 hours as needed.  5. The patient reportedly was also on Lipitor in addition to the      Crestor at also 5 mg daily.   SOCIAL HISTORY:  The patient lives with his parents.  History of  multiple substance abuse.  He is transgender.   FAMILY HISTORY:  No significant family history at this point.   REVIEW OF SYSTEMS:  The 10-point review of systems is negative, except  per the HPI.  Of note, the patient also has a history of panic attacks  and previous MRSA infection.   PHYSICAL EXAMINATION:  VITAL SIGNS:  On his exam temperature is 97.8,  blood pressure is 137/91, pulse is 86, respiratory rate is 16, and sat  is 100% on room air.  GENERAL APPEARANCE:  In general the patient is a transgender.  He is  stable and in no acute distress.  HEENT:  PERRL.  EOMI.  NECK:  The neck is supple.  No JVD.  No lymphadenopathy.  CHEST AND LUNGS:  Respiratory - he has good air entry bilaterally.  No  wheezes or rales.  HEART:  Cardiovascular system - S1 and S2.  No murmur.  ABDOMEN:  The abdomen is soft and nontender with positive bowel sounds.  EXTREMITIES:  The extremities reveal no edema, cyanosis or clubbing.   LABORATORY  DATA:  White count 3.4, hemoglobin 14.3 and platelet count  128,000 with normal differential.  Urine drug screen was positive for  opiates and benzodiazepines.  Alcohol level less than 5.  Sodium 134,  potassium 3.5, chloride 94, CO2 28, glucose 107, BUN 5, creatinine 1.02,  and calcium 9.6.  Urinalysis was essentially negative.  Head CT without  contrast showed no acute intracranial abnormalities.  There was moderate  cortical atrophy and severe cerebrovascular atrophy for his age.  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   ASSESSMENT:  1. This a 30 year old gentleman with a history of alcoholism      presenting with seizure.  From all indications this seems to be an      alcohol withdrawal seizure.  The patient has no prior history of      seizures.  For this we will admit him for observation and seizure      precautions.  I will not load him with Dilantin or any antiseizure      medication yet until he has another episode.  In the meantime I      will just use Ativan for his delirium  tremens.  I will actually use      a CIWA protocol.  I will put him on seizure precautions.  If there      are no further episodes of seizures in the next 24 hours we will      transfer the patient back to [facility] for continued to      alcohol rehabilitation.  2. Other medical problems are chronic and related to his alcoholism      and polysubstance abuse.  I will continue to monitor him closely.      Lonia Blood, M.D.  Electronically Signed     LG/MEDQ  D:  07/24/2008  T:  07/25/2008  Job:  [id number]

## 2010-12-01 NOTE — Discharge Summary (Signed)
[name], [name]                 ACCOUNT NO.:  [id number]   MEDICAL RECORD NO.:  [id number]          PATIENT TYPE:  INP   LOCATION:  1503                         FACILITY:  [facility]   PHYSICIAN:  Hillery Aldo, M.D.   DATE OF BIRTH:  09/01/1980   DATE OF ADMISSION:  05/01/2007  DATE OF DISCHARGE:  05/08/2007                               DISCHARGE SUMMARY   PRIMARY CARE PHYSICIAN:  Marcene Duos, M.D., with [facility].   UROLOGIST:  Courtney Paris, M.D.   DISCHARGE DIAGNOSES:  1. Polysubstance abuse including alcohol, cocaine, tobacco and      marijuana.  2. Intractable nausea and vomiting.  3. Hematemesis likely secondary to a Mallory Weiss tear.  4. Mood disorder.  5. Hematuria.  6. Renal cyst, follow-up MRI recommended.  7. Fatty infiltration of the liver.  8. Transgender.   DISCHARGE MEDICATIONS:  1. Trazodone 50 mg daily.  2. Fluoxetine 20 mg daily.  3. Seroquel 300 mg nightly.   CONSULTATION:  Courtney Paris, M.D., of urology.   BRIEF ADMISSION HISTORY OF PRESENT ILLNESS:  The patient is a 30-year-  old male who was admitted with acute alcohol intoxication accompanied by  intractable nausea, vomiting, and complaints of hematemesis.  He was  admitted for further evaluation and workup.  For the full details,  please see the dictated report done by Dr. Karilyn Cota.   PROCEDURES AND DIAGNOSTIC STUDIES:  1. Renal ultrasound on 2007/05/15, showed a complex cystic mass      of the left kidney.  2. Abdominal ultrasound on 05/15/2007, showed diffuse fatty      infiltration of the liver.  3. CT scan of the abdomen and pelvis on 15-May-2007, showed      diffuse fatty infiltration of the liver.  A 2 cm lesion of the      anterior aspect of the lower left kidney.  Although some features      were suggestive that this might be a cyst, it was not a simple cyst      based on some thickening of the anteromedial wall with nodularity.      A cystic  neoplasm could not be excluded.  MRI was recommended for      further assessment.  Scanning of the pelvis was unremarkable except      for a possible right undescended testicle versus inguinal hernia.   DISCHARGE LABORATORY VALUES:  Sodium was 134, potassium 3.9, chloride  100, bicarb 30, glucose 106, BUN 3, creatinine 1.04.  White blood cell  count was 9.1, hemoglobin 14.3, hematocrit 41.2, platelets 317.   HOSPITAL COURSE BY PROBLEM:  PROBLEM #1 -  INTRACTABLE NAUSEA, VOMITING,  AND HEMATEMESIS:  The patient was admitted and serial hemoglobin and  hematocrit values were checked.  His hemoglobin and hematocrit remained  stable.  He was treated with antiemetics.  His hematemesis was thought  to be secondary to a [employer] tear given his known history of  alcohol abuse.  Nevertheless, the time of discharge his nausea and  vomiting have completely resolved.  PROBLEM #2 -  ALCOHOLISM:  The patient was treated with benzodiazepines  on a p.r.n. basis.  He never went into fulminant alcohol withdrawal  syndrome.  He was supplemented with thiamine and folic acid and referred  to [facility] for further treatment as an outpatient.   PROBLEM #3 -  HEMATURIA:  The patient did develop significant gross  hematuria.  A urology consult was requested and kindly provided by Dr.  Aldean Ast, who suggested a CT scan be obtained.  This was done with the  findings as noted above.  Attempts were made to obtain an MRI scan prior  to discharge.  However, the patient became claustrophobic and could not  tolerate the closed MRI scanner.  A follow-up appointment has been made  with Dr. Aldean Ast.  He can follow up with him and if it is determined  that an MRI scan is warranted, this can be set up as an outpatient in an  open MRI scanner.   PROBLEM #4 -  MOOD DISORDER:  The patient was continued on his usual  psychotropic medications.  He did not develop any suicidal ideation or  decompensation during the course of  his hospitalization.   PROBLEM #5 -  POLYSUBSTANCE ABUSE:  The patient was advised to avoid  alcohol and cocaine use.  Again, he was referred to [facility] prior to  discharge.   DISPOSITION:  The patient is medically stable for discharge.  He should  follow up with his primary care physician and with Dr. Aldean Ast.      Hillery Aldo, M.D.  Electronically Signed     CR/MEDQ  D:  05/08/2007  T:  05/09/2007  Job:  [id number]   cc:   Marcene Duos, M.D.   Courtney Paris, M.D.  Fax: [phone]

## 2010-12-01 NOTE — Discharge Summary (Signed)
[name], [name]                 ACCOUNT NO.:  [id number]   MEDICAL RECORD NO.:  [id number]          PATIENT TYPE:  INP   LOCATION:  1521                         FACILITY:  [facility]   PHYSICIAN:  Eduard Clos, MDDATE OF BIRTH:  1980/12/01   DATE OF ADMISSION:  07/24/2008  DATE OF DISCHARGE:                               DISCHARGE SUMMARY   COURSE IN THE HOSPITAL:  A 30 year old transient who was transferred  from [facility] for alcohol rehab after the patient was witnessed to  have a seizure, at least twice.  Was admitted for further evaluation.  The patient is a known chronic alcoholic.  The seizure was attributed to  his alcohol withdrawal.  The patient has not had a previous history of  seizure, and this is the first time he has had a seizure.  The patient  was observed in the hospital for 24 hours.  He has had no incidences of  seizure in the hospital.  A CT of the head did not show any acute  findings.  The patient is awake, alert and oriented and has had no  neurological events during this stay.  Will transfer patient back to  [facility] for alcohol rehab, as the patient is acceptable to the  plan.  The patient has been strongly advised not to drink alcohol or  abuse any drugs,  to quit smoking, and not to drive until cleared by his  primary care physician.   At this time, arrangements are being made to transfer patient to  [facility] for alcohol rehab.   PROCEDURES DURING THIS STAY:  CT of the head without contrast on July 24, 2008.  Showed no acute intracranial abnormalities, moderate cortical  atrophy, and severe cerebellar vermilion atrophy for age.   Drug screen done during this stay was only positive for benzodiazepine  and opiates.  It was negative for cocaine, amphetamine, barbiturates,  and marijuana.   FINAL DIAGNOSES:  1. Alcohol-withdrawal seizures.  2. Hypertension.  3. Hyperlipidemia.  4. Polysubstance abuse.   DISCHARGE MEDICATIONS:  1. Lorazepam 1 mg p.o. t.i.d. p.r.n. agitation.  2. Benadryl 25 mg p.o. t.i.d. p.r.n.  3. Crestor 4 mg p.o. daily.  4. Atenolol 25 mg p.o. daily.   Patient is to be transferred to [facility] to follow up with  his primary care physician in a week's time after discharge from the  rehab.  To repeat a CMET and CBC within a week's time.  Patient is  advised to not to drive until cleared by his primary care physician.  At  this time, the patient's seizures are first-time attributed to alcohol  withdrawal and is not being on any antiseizure medication.  He was  advised if subsequent seizures, will need a complete neurological  workup.  Patient is to be on a cardiac-healthy diet.  Patient is to be  on fall precautions and seizure precautions.      Eduard Clos, MD  Electronically Signed     ANK/MEDQ  D:  07/26/2008  T:  07/26/2008  Job:  [id number]

## 2010-12-01 NOTE — Discharge Summary (Signed)
[name], [name]                 ACCOUNT NO.:  [id number]   MEDICAL RECORD NO.:  [id number]          PATIENT TYPE:  OBV   LOCATION:  6729                         FACILITY:  [facility]   PHYSICIAN:  Renee Ramus, MD       DATE OF BIRTH:  1980-11-05   DATE OF ADMISSION:  04/03/2008  DATE OF DISCHARGE:  04/09/2008                               DISCHARGE SUMMARY   PRIMARY DIAGNOSIS:  Alcohol withdrawal.   SECONDARY DIAGNOSES:  1. Dyslipidemia.  2. Hypertension.  3. Polysubstance abuse.  4. Transgender.   HOSPITAL COURSE BY PROBLEM:  1. Alcohol withdrawal.  The patient is a 30 year old transgender      patient with known history of alcohol and polysubstance abuse, who      presented with intractable nausea, vomiting, but no diarrhea,      cough.  The patient was seen in emergency department, admitted to      our service.  The patient did go through significant alcohol      withdrawal.  He was treated aggressively with benzos and he was on      CIWA protocol.  The patient is now completely well, his symptoms      have abated.  He is now stable for discharge.  The patient has been      given information to follow up with AA.  The patient is interested      in stopping drinking and we have had long-term discussions      regarding the physical component versus the mental component of      alcohol withdrawal.  The patient is now stable for discharge.  2. Depression, anxiety.  The patient will follow up with his primary      care physician for help with sleep medicine and any additional      treatment for depression, anxiety.  The patient did see a      psychiatrist in consultation.  They recommended followup as      outpatient if needed, but did not have additional information or      prescribe any new medications.  3. Dyslipidemia.  The patient does have mild increase in cholesterol.      However, the patient did have elevated CK and did have complaints      consistent with bad side effect  of statin medications.  We are      holding the statin medication on discharge and ask him to follow up      with his primary care physician for alternatives.  4. Hypertension.  The patient has been somewhat hypertensive,      responded well to beta-blockers.  He will be sent home on atenolol      25 mg p.o. daily.  5. Rhabdomyolysis.  This may or may not be secondary to his statin      medication versus his withdrawal problems, though his rhabdo which      was mild, has not completely resolved, and again we are holding his      statin on discharge.  6. Dehydration.  The patient  is now well-hydrated and no further      treatment is needed.   LABS OF NOTE:  1. Initially, the patient had dehydration with hemoconcentration with      initial hemoglobin of 18, hematocrit of 53.  This now dropped to a      hemoglobin of 13.6, hematocrit of 40.3.  2. Mild hypokalemia with potassium of 3.4.  3. Initial renal failure with creatinine of 1.3, resolved with IV      fluids to a creatinine of 0.9.  4. Mild rhabdomyolysis with initial CK of 692, rising to 2433,      decreasing to 384 on day of discharge.  5. TSH of 1.279.  6. UA showing no evidence of infection.   STUDIES:  1. Chest CT angiogram showing no acute cardiopulmonary process.  2. Chest x-ray showing no acute disease.   MEDICATIONS ON DISCHARGE:  Atenolol 25 mg p.o. daily.   There are no labs or studies pending at time of discharge.  The patient  is in stable condition and anxious for discharge.  Time spent 35  minutes.      Renee Ramus, MD  Electronically Signed     JF/MEDQ  D:  04/09/2008  T:  04/10/2008  Job:  [id number]

## 2010-12-01 NOTE — Procedures (Signed)
BYPASS GRAFT EVALUATION   INDICATION:  Follow up left above-knee to below-knee popliteal bypass  graft.   HISTORY:  Diabetes:  No.  Cardiac:  No.  Hypertension:  Yes.  Smoking:  Previously.  Previous Surgery:  Left above-knee to below-knee bypass graft with  reverse saphenous vein.   SINGLE LEVEL ARTERIAL EXAM                               RIGHT              LEFT  Brachial:                    134                140  Anterior tibial:             144                140  Posterior tibial:            140                139  Peroneal:  Ankle/brachial index:        1.03               1.00   PREVIOUS ABI:  Date: 12/12/2009  RIGHT:  1.13  LEFT:  1.22   LOWER EXTREMITY BYPASS GRAFT DUPLEX EXAM:   DUPLEX:  Lower extremity bypass graft duplex exam is patent left above-  knee to below-knee popliteal bypass graft with triphasic and biphasic  waveforms proximal, within, and distally.   IMPRESSION:  1. Stable ankle brachial indices.  2. Patent left above-knee to below-knee popliteal bypass graft.   ___________________________________________  Larina Earthly, M.D.   OD/MEDQ  D:  06/30/2010  T:  06/30/2010  Job:  [id number]

## 2010-12-01 NOTE — H&P (Signed)
NAMEFOYE, DAMRON                 ACCOUNT NO.:  [id number]   MEDICAL RECORD NO.:  [id number]          PATIENT TYPE:  INP   LOCATION:  1827                         FACILITY:  [facility]   PHYSICIAN:  Lonia Blood, M.D.      DATE OF BIRTH:  Nov 21, 1980   DATE OF ADMISSION:  04/03/2008  DATE OF DISCHARGE:                              HISTORY & PHYSICAL   PRIMARY CARE PHYSICIAN:  The patient is unassigned to [place].   PRESENTING COMPLAINT:  Cough, nausea, and vomiting for 4 days.   HISTORY OF PRESENT ILLNESS:  The patient is a 30 year old transgender  with known history of alcohol abuse and polysubstance abuse, who is here  with cough going on for about 4 weeks.  In the past 4 days; however, he  has had some rhinorrhea and he has had an intractable nausea, vomiting,  and unable to keep food down.  Denied any diarrhea or constipation.  Denied any bright red blood per rectum or melena.  The patient has had  prior presentation and his last admission in October 2008, was exactly  in the same fashion.  At that point, it was a case of alcohol withdrawal  and cocaine intoxication.  He denied any fever, but he did have some  chills.   PAST MEDICAL HISTORY:  Significant for alcoholism with frequent  withdrawals, anxiety disorder, depression, dyslipidemia, hypertension,  and transgenderism.   ALLERGIES:  He is allergic to PENICILLIN.   MEDICATIONS:  Include Crestor 5 mg daily and Lipitor 5 mg daily.   SOCIAL HISTORY:  The patient lives with his parents.  He has history of  tobacco, alcohol, and cocaine use.  He is transgender.   FAMILY HISTORY:  Noncontributory at this point.   REVIEW OF SYSTEMS:  Negative, except per HPI.   PHYSICAL EXAMINATION:  VITAL SIGNS:  Temperature is 99.1, blood pressure  158/102, pulse 155, respiratory rate 20, and sats 97% on room air.  GENERAL:  The patient is awake, alert, and oriented, but in no acute  distress.  HEENT:  PERRL.  EOMI.  NECK:  Supple.  No JVD.  No  lymphadenopathy.  RESPIRATORY:  Has good air entry bilaterally with some mild expiratory  wheezing.  CARDIOVASCULAR:  The patient has S1 and S2.  No murmurs.  ABDOMEN:  Soft and nontender with positive bowel sounds.  EXTREMITIES:  No edema, cyanosis, or clubbing.   LABORATORY DATA:  Sodium is 139, potassium 3.1, chloride 102, BUN 5,  creatinine 1.1, glucose 92, and calcium 0.96 ionized.  White count is  8.2, hemoglobin 16.8, and platelet count 222.  ANC of 6.6.  His total CK  is 692.  Urinalysis showed cloudy amber urine with small bilirubin,  small ketones, protein of more than 300, small leukocyte esterase, WBC 3-  6, and rare bacteria.  Chest x-ray 2-view showed no acute  cardiopulmonary abnormalities.  EKG shows sinus tachycardia with some  fusion complexes, which is unchanged from his previous EKG in October  2008.   ASSESSMENT:  This is a 30 year old transgender male with intractable  nausea and vomiting.  Most likely case of drug use.  He complained of  cough over the past 2 weeks, which is getting worse.  It could be a case  of mild bronchitis.   PLAN:  1. Cough and shortness of breath, although this has been going on for      about a week or two, the patient has recently had rhinorrhea.  With      concomitant upper respiratory tract infection symptoms and GI      symptoms, we do need to rule out H1N1.  We will get H1N1 swab.      Admit the patient.  Treat him for bronchitis with IV antibiotics,      nebulizers, and oxygen.  We will see how the patient does      overnight.  2. Nausea and vomiting, again the patient could be withdrawing from      drugs where he could be intoxicated.  We will check a urine drug      screen, and treat his nausea and vomiting symptomatically with      Phenergan and Zofran.  3. Dyslipidemia.  I will check fasting lipid panel and continue him on      Lipitor.  Increase CPK.  The increased CPK is really not rhabdo.      It is only 697, and the  patient is on statins, which will explain      his increase in the CK level.  We will follow CK level here to make      sure it is not rising.  4. Polysubstance abuse.  I will put nicotine patch on the patient,      also thiamine and folate.  Further assessment treatment depends on      the patient's response in the hospital.      Lonia Blood, M.D.  Electronically Signed     LG/MEDQ  D:  04/04/2008  T:  04/04/2008  Job:  [id number]

## 2010-12-01 NOTE — Consult Note (Signed)
NEW PATIENT CONSULTATION   LYNKIN, SAINI  DOB:  1980/12/06                                       10/24/2009  MVHQI#:[id number]   The patient presents today for evaluation of left neck claudication.  He  is a 30 year old gentleman who fell on March 15, suffering a strain to  his left knee.  He did have some initial bruising and had recovery of  this.  He now reports that he has had calf tightness with minimal  walking.  It is relieved with rest.  He has had some shooting pains down  into his foot as well but no arterial rest pain.  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  He is here today for further discussion.  He does  report no fracture or dislocation of his knee, and this was confirmed  with x-ray.  He reports that the claudication symptoms are quite severe  and are relieved with rest.   PAST HISTORY:  Significant for hypertension, elevated cholesterol.   FAMILY HISTORY:  Negative for premature atherosclerotic disease.   SOCIAL HISTORY:  He is married.  He is not employed.  He does not smoke,  having quit in 2006.  He did have a long history of alcohol abuse and  has not used alcohol for 1 month.   REVIEW OF SYSTEMS:  CONSTITUTIONAL:  No weight loss or weight gain.  His  weight is 160 pounds, he is 5 feet 6 inches tall.  CARDIAC:  Positive for shortness of breath with exertion, chest pain.  PULMONARY, GI, GU:  Negative.  VASCULAR:  Positive per HPI.  NEUROLOGIC:  Positive for blackouts and seizures.  MUSCULOSKELETAL:  Negative.  PSYCHIATRIC:  For depression and anxiety.  HEENT:  Negative.  HEMATOLOGIC, SKIN:  Negative.   PHYSICAL EXAMINATION:  Well-developed, well-nourished black male  appearing stated age of 77.  Blood pressure is 120/82, pulse 83,  respirations 18, and his tem is 98.4.  He is in no acute distress.  HEENT is normal.  Chest is clear bilaterally without wheezes or  rhonchi.  Heart is regular rate and rhythm.  Carotid arteries without bruits.  He  has 2+ radial pulses.  He has 2+ femoral pulses and 2+ right dorsalis  pedis pulse.  He has absent left popliteal and distal pulses.  He does  not have any specific calf tenderness.  Abdominal exam is benign with no  masses.  Musculoskeletal with no major deformity or cyanosis.  Neurologic with no focal weakness or paresthesias.  Skin without ulcers  or rashes.   I did review a CT angiogram and discussed this with the patient and his  mother and father present.  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  I explained that this is quite unusual and that this would  typically be seen with trauma with dislocation of the knee.  He reports  that he does not recall that he has been in any unusual position for a  long period of time following this and that he was able to walk  immediately following the procedure, but has had persistent claudication  symptoms.  He is unable to tolerate this level of claudication.  I have  recommended that we proceed with above knee to below popliteal  bypass  for relief of his symptoms.  I explained that we would potentially  attempt thrombectomy of the artery at that time, but that in all  likelihood there had been arterial damage resulting in the occlusion.  I  explained the procedure, including saphenous vein harvest and potential  for occlusion over time.  He understands and wishes to proceed as soon  as possible.  We have scheduled this for surgery on 04/19 at his  convenience.     Larina Earthly, M.D.  Electronically Signed   TFE/MEDQ  D:  10/24/2009  T:  10/27/2009  Job:  [id number]   cc:   Marcene Duos, M.D.

## 2010-12-01 NOTE — Consult Note (Signed)
NAMEALMALIK, [name]                 ACCOUNT NO.:  [id number]   MEDICAL RECORD NO.:  [id number]          PATIENT TYPE:  INP   LOCATION:  1503                         FACILITY:  [facility]   PHYSICIAN:  Courtney Paris, M.D.DATE OF BIRTH:  [birth year]   DATE OF CONSULTATION:  05/03/2007  DATE OF DISCHARGE:                                 CONSULTATION   Request by Dr. Marcellus Scott, MD.   REASON FOR CONSULTATION:  Gross hematuria.   BRIEF HISTORY:  This 30 year old depressed alcoholic transgender patient  was admitted with alcohol intoxication 2 days ago.  Apparently once  yesterday and three times today he has had some terminal hematuria.  He  never had this before.  He has no burning with urination.  He was told  he had some blood in his urine when he was 13 in the past.  He never had  a UTI or hematuria.  He was admitted for a suicide attempt last April.  His creatinine was 0.8 at that time and his renal ultrasound was normal  April 2008.  His laboratory values here showed creatinine pain-free  0.97, hematocrit 34%, white count 5300, platelets 126,000.  INR is 1.0.  He is a current smoker, about a pack a day.  His urinalysis shows a few  bacteria and white cells on a voided specimen when he came in.  The  urine done May 02, 2007, was negative for blood.  He does have some  elevated liver function tests from his drinking.  He also has abused  cocaine in the past does not at this time, according to the patient.   PAST MEDICAL HISTORY SOCIAL HISTORY AND REVIEW OF SYSTEMS:  He is  single.  Lives with his parents.  Smokes a pack of cigarettes per day.  Drinks 3-4 beers per day, as mentioned above.  He is unemployed.   MEDICINES ON ADMISSION:  1. Trazodone 50 mg a day.  2. Fluoxetine 20 mg daily.  3. Seroquel 300 mg p.o. q.h.s.   He has allergies to PENICILLIN.   He has not had any operations but did have some silicone injections of  his breasts while he lived down in [place] a  year or two ago.  He was  considering transgender surgery but now is not sure.  The rest of his  12 point review of systems is otherwise unremarkable.   His blood pressure is 130/90, pulse 105, temperature is 100.0, weight  170.  He is a pleasant, somewhat effeminate black male sitting quietly in bed  in no acute distress.  HEENT:  Clear.  CHEST:  Clear.  His breasts are quite enlarged with the previous silicon  injections.  They are nontender.  No masses felt.  ABDOMEN:  Scaphoid, benign.  His bladder is not distended.  No  organomegaly except his liver edge is down two fingerbreadths below the  right costal margin in the midclavicular line.  Penis uncircumcised.  He has atrophic testes bilaterally, a small,  benign prostate.  EXTREMITIES:  Negative, no edema.  Good distal pulses.   IMPRESSION:  1.  History of terminal hematuria.  2. Alcohol abuse.  3. Drug abuse.  4. Transgender patient with depression.   RECOMMENDATIONS:  If he is going to be here tomorrow, we will go ahead  and set up a CT scan with and without contrast just for documentation  and will check a urine culture and cover him with some antibiotics if it  shows any infection.  Otherwise, we can see him in the office and do a  cystoscopy at that time.      Courtney Paris, M.D.  Electronically Signed     HMK/MEDQ  D:  05/03/2007  T:  05/04/2007  Job:  [id number]

## 2010-12-01 NOTE — Procedures (Signed)
BYPASS GRAFT EVALUATION   INDICATION:  Followup left above knee to below knee popliteal artery  bypass graft.   HISTORY:  Diabetes:  No.  Cardiac:  No.  Hypertension:  Yes.  Smoking:  Previously.  Previous Surgery:  Left above knee to below knee popliteal bypass graft  on 11/04/2009.   SINGLE LEVEL ARTERIAL EXAM                               RIGHT              LEFT  Brachial:                    126                116  Anterior tibial:             137                136  Posterior tibial:            143                135  Peroneal:  Ankle/brachial index:        1.13               1.07   PREVIOUS ABI:  Date:  12/12/2009  RIGHT:  1.10  LEFT:  1.22   LOWER EXTREMITY BYPASS GRAFT DUPLEX EXAM:   DUPLEX:  Patent left above knee to below knee popliteal bypass graft  with biphasic waveforms noted proximal, within and distally.   IMPRESSION:  1. Stable ankle brachial indices bilaterally.  2. Patent left above knee to below knee popliteal bypass graft.   ___________________________________________  Larina Earthly, M.D.   CB/MEDQ  D:  03/13/2010  T:  03/13/2010  Job:  [id number]

## 2010-12-01 NOTE — H&P (Signed)
[name], TANZI                 ACCOUNT NO.:  [id number]   MEDICAL RECORD NO.:  [id number]          PATIENT TYPE:  INP   LOCATION:  1503                         FACILITY:  [facility]   PHYSICIAN:  Wilson Singer, M.D.DATE OF BIRTH:  [birth year]   DATE OF ADMISSION:  05/01/2007  DATE OF DISCHARGE:                              HISTORY & PHYSICAL   HISTORY:  This is a 30 year old man who gives a 6 day history of nausea  and vomiting, almost in an intractable manner with episodes of heaving  to the point where he says he has been vomiting blood.  There is no  melena.  There is no significant abdominal pain.  He says he drinks  alcohol, 3-4 beers a day and admits to heavy alcohol abuse.  He also has  a history of cocaine abuse.  He has also had previous suicide attempts  x4.   PAST MEDICAL HISTORY:  Apart from the above, nothing else.  No  operations.   SOCIAL HISTORY:  He is single, lives with his parents.  He smokes 1 pack  of cigarettes per day.  He drinks 3-4 beers per day as mentioned above.  He is unemployed.   MEDICATIONS:  1. Trazodone 50 mg daily.  2. Fluoxetine 20 mg daily.  3. Seroquel 300 mg q.h.s.   ALLERGIES:  PENICILLIN.   REVIEW OF SYSTEMS:  Apart from the symptoms listed above, there are no  other symptoms referable to all systems reviewed.   FAMILY HISTORY:  Noncontributory.   PHYSICAL EXAMINATION:  Temperature 98.1, blood pressure 145/93, pulse  79, saturation 100%, respiration rate 12.  He does not look pale.  He is hemodynamically stable clinically.  CARDIOVASCULAR:  Heart sounds are present and normal.  RESPIRATORY:  Lungs were clear.  ABDOMEN:  Soft, nontender with no hepatosplenomegaly.  NEUROLOGICAL:  He alert and oriented and does not appear to be actively  withdrawing from alcohol.  He is not drowsy.  There are no focal  neurologic signs.   INVESTIGATIONS:  Hemoglobin 16.1, white blood cell count 6.9, platelets  201, sodium 135, potassium 3.5,  bicarbonate 25, glucose 126, BUN 9,  creatinine 1.57, lipase 13.  Urine drug screen is positive for cocaine.   IMPRESSION:  1. Hematemesis probably Mallory-Weiss tear.  2. Alcohol abuse with possible argue withdrawal.  3. Cocaine abuse.   PLAN:  1. Admit.  2. NPO, intravenous fluids.  3. Alcohol withdrawal protocol.  4. Proton pump inhibitor and watch hemoglobin.  Consider      gastroenterology consult.   Further recommendations will depend on the patient's hospital progress.      Wilson Singer, M.D.  Electronically Signed     NCG/MEDQ  D:  05/02/2007  T:  05/02/2007  Job:  [id number]

## 2010-12-01 NOTE — H&P (Signed)
[name], [name]                 ACCOUNT NO.:  [id number]   MEDICAL RECORD NO.:  [id number]          PATIENT TYPE:  EMS   LOCATION:  ED                           FACILITY:  [facility]   PHYSICIAN:  Eduard Clos, MDDATE OF BIRTH:  Apr 19, 1981   DATE OF ADMISSION:  11/13/2008  DATE OF DISCHARGE:                              HISTORY & PHYSICAL   History obtained from ER physician, patient's mother, and patient.   PRIMARY CARE PHYSICIAN:  Unassigned.   CHIEF COMPLAINT:  Hallucinations and restlessness.   HISTORY OF PRESENT ILLNESS:  A 30 year old male with known history of  chronic alcoholism who since age 31 was [facility] and since patient was not able to be managed there because of  persistent hallucinations, restlessness patient was transferred back to  [facility].  Patient had been here at [facility] for the last 2 to 3  times over the last 5 days and at this time because of the repeated  referral to the ER because of his withdrawal symptoms, will be managed  at this hospital at this time.   Patient denies any chest pain, shortness of breath.  Denies any  abdominal pain, nausea, vomiting, diarrhea, fever, chills.  The nurse,  the patient's mother, and the ER physician have observed that patient  has been getting repeatedly restless and trying to get out of bed and  hallucinating, seeing things.  Patient is oriented to time, place, and  person and is able to recognize his mother and he is able to say which  month this is.   PAST MEDICAL HISTORY:  1. Chronic alcoholism.  2. Hypertension.  3. Hyperlipidemia.  4. Substance abuse.   PAST SURGICAL HISTORY:  None per patient.   MEDICATIONS PRIOR TO ADMISSION:  Patient is on:  1. Atenolol 25 mg p.o. daily.  2. Clonidine 0.1 mg p.o. t.i.d.  3. Thiamine 100 mg p.o. daily.  4. Zoloft 100 mg p.o. 2 tablets daily.  5. Dilantin 200 mg p.o. b.i.d.  6. Atenolol 25 mg p.o. daily.  7. Trazodone 50 mg  p.o. at bedtime.  8. Crestor 20 mg p.o. daily.   ALLERGIES:  PENICILLIN.   SOCIAL HISTORY:  Patient supported by his mother.  Smokes cigarettes per  report but he says no now and drinks beer and vodka daily and also has  history of cocaine abuse though his drug screen is negative at this  time.  He is a transgender.   FAMILY HISTORY:  Nothing contributory.   REVIEW OF SYSTEMS:  As per history of present illness.  Nothing of  significance.   PHYSICAL EXAMINATION:  Patient examined at bedside.  He is restless.  He  is answering questions appropriately.  He will recognize his mother.  Able to tell his history.  He is cooperative.  VITAL SIGNS:  Blood pressure 120/90.  Pulse is 120 per minute.  Temperature 98.7.  Respiration 18 per minute.  O2 saturation 97% on room  air.  HEENT:  Anicteric.  No pallor.  No facial asymmetry.  No obvious head  injury.  CHEST:  Bilateral air entry present.  No rhonchi.  No crepitation.  HEART:  S1 and S2, heard.  ABDOMEN:  Soft, nontender.  Bowel sounds heard.  CNS:  Patient is alert, awake and oriented to time, place, and person.  Moves upper and lower extremities 5/5.  He is restless.  EXTREMITIES:  Peripheral pulses felt.  No edema.   LABS:  EKG, sinus tachycardia with no acute ST-T wave changes.  CT,  head, on November 13, 2008, no acute intracranial findings, stable  appearance of diffuse global atrophy.  CBC, WBC is 12.8, hemoglobin  12.6, hematocrit 37, platelets 94.  Basic metabolic panel, sodium 137,  potassium 3.4, chloride 102, glucose 91, BUN 3, creatinine 0.9.  LFTs  done yesterday shows AST of 127, ALT of 89, is better than and improved  than on November 11, 2008, when it was 213 and 117, probably secondary to  alcohol, total bilirubin 1.9, albumin 3.9.  Acetaminophen level is less  than 10.  Phenytoin level is 18, it is therapeutic.  Drug screen is  positive for benzodiazepine.  Urine is negative for nitrites and  leukocytes.    ASSESSMENT:  1. Active alcohol withdrawal.  2. History of polysubstance abuse.  3. Hypertension.  4. Hyperlipidemia.  5. Sinus tachycardia from withdrawal.  6. Transgender.   PLAN:  We will admit patient to step down unit for close observation as  patient may need more Ativan including maybe __________drip.  We will  continue with thiamine and Ativan as needed and also on a protocol.  Continue his home medication.  We will get a psychiatric consult and  further recommendations as condition evolves.  Discussed with the  patient's mother.      Eduard Clos, MD  Electronically Signed     ANK/MEDQ  D:  11/13/2008  T:  11/13/2008  Job:  [id number]

## 2010-12-01 NOTE — Consult Note (Signed)
NAMEDELSHAWN, [name]                 ACCOUNT NO.:  [id number]   MEDICAL RECORD NO.:  [id number]          PATIENT TYPE:  INP   LOCATION:  1513                         FACILITY:  [facility]   PHYSICIAN:  Antonietta Breach, M.D.  DATE OF BIRTH:  01/15/1981   DATE OF CONSULTATION:  11/13/2008  DATE OF DISCHARGE:  11/15/2008                                 CONSULTATION   REQUESTING PHYSICIAN:  [facility].   REASON FOR CONSULTATION:  Alcohol withdrawal, delirium.   HISTORY OF PRESENT ILLNESS:  [name] is a 30 year old male admitted to  the [facility] on November 13, 2008 due to alcohol withdrawal day  as well as alcohol withdrawal delirium.   [name] has also been experiencing depression with low interest and low  energy; however, he notes that his Zoloft has been associated with an  improvement in his depression by 70%.  He has intact hope.  Also, his  delirium has resolved.  He has intact constructive future goals and  interests.  He has no thoughts of harming himself or others day.  His  memory and orientation function are intact.   He is cooperative with care.   PAST PSYCHIATRIC HISTORY:  History of alcohol dependence as well as  depression.   He does have a history of a suicide attempt in the past as well as a  psychiatric admission.  He does not have any history of elevated mood or  decreased need for sleep.   FAMILY PSYCHIATRIC HISTORY:  None known.   SOCIAL HISTORY:  He is single.   RELIGION:  Holiness.   SUBSTANCE ABUSE:  As above.   PAST MEDICAL HISTORY:  Delirium now resolved.   MENTAL STATUS EXAMINATION:  [name] is alert.  He is oriented to all  spheres.  His affect is mildly flat at baseline, but with a broad and  appropriate range.  His mood is within normal limits.  His memory is  intact to immediate, recent and remote, except for the delirium.  His  speech is normal.  Thought process is logical, coherent, goal directed.  No looseness of associations.   Thought content - no thoughts of harming  himself or others.  No delusions or hallucinations.  His insight is  intact.  His judgment is intact.   ASSESSMENT:  AXIS I:  Alcohol dependence, alcohol withdrawal delirium,  now clear.  AXIS II:  Deferred.  AXIS III:  Please see above.  He also has a history of seizures.  AXIS IV:  Primary support group.  AXIS V:  55.   ADDENDUM:  [name] is not at risk to harm himself or others.  He agrees  to call emergency services immediately for any thoughts of harming  himself, thoughts of harming others or distress.   RECOMMENDATIONS:  1. Would continue his Zoloft 100 mg daily for antidepression.  2. Would ask the social worker to set [name] up with an outpatient      [facility]  3. Would set [name] up with the [place] program  for substance      rehabilitation.  4. Would proceed with 12-step method and groups.      Antonietta Breach, M.D.  Electronically Signed     JW/MEDQ  D:  11/16/2008  T:  11/16/2008  Job:  [id number]

## 2010-12-01 NOTE — Discharge Summary (Signed)
NAMESKANDA, WORLDS                 ACCOUNT NO.:  [id number]   MEDICAL RECORD NO.:  [id number]          PATIENT TYPE:  IPS   LOCATION:  0501                          FACILITY:  [facility]   PHYSICIAN:  Geoffery Lyons, M.D.      DATE OF BIRTH:  Dec 16, 1980   DATE OF ADMISSION:  10/18/2006  DATE OF DISCHARGE:  10/25/2006                               DISCHARGE SUMMARY   CHIEF COMPLAINT AND PRESENT ILLNESS:  This was the first admission to  [facility] for this 30 year old male, single,  voluntarily admitted.  Presented in the ED after taking an entire bottle  of Tylenol PM, several beers to drink and his alcohol level was 145.  Had been using cocaine.  Lost his job one month prior to this admission  and, because of his financial difficulty, lost his apartment.  Family  was supportive but was feeling hopeless and depressed.  Prior to losing  his job, has been drinking about five beers every other day.  His  drinking has escalated since job loss to daily use, drinking about five  beers a day.  His use of cocaine has also increased from occasional use  on the weekend to several days.  Endorsed increased anhedonia, strong  sense of loss, reclusiveness, hypersomnia.  Staying in bed as much as 10  hours a day.  Decreased appetite.   PAST PSYCHIATRIC HISTORY:  First time at [facility].  Has never  been treated before.  Prior suicide attempt at age 68 by overdose.  He  endorsed history of depression, anhedonia and dysphoria.   ALCOHOL/DRUG HISTORY:  Using alcohol for the last two or three years and  cocaine socially on weekends for about a year.   MEDICAL HISTORY:  High blood pressure, dyslipidemia, gastroenteritis.   MEDICATIONS:  None.   PHYSICAL EXAMINATION:  Performed and failed to show any acute findings.   LABORATORY DATA:  CBC within normal limits.  Hemoglobin 17.1.  Sodium  140, potassium 3.4, glucose 95.  Alcohol level was 145 in the emergency  room.  UDS was  positive for cocaine.  TSH within normal limits.   MENTAL STATUS EXAM:  Fully alert male, goes by [place], cooperative,  pleasant, polite, in a gown.  Affect was blunted.  Appeared to be  somewhat depressed, articulate.  Speech was soft in tone, but normal in  pace, fluent, articulate.  Mood was depressed.  Endorsed decided to be  treated with antidepressant medication.  Mother has history of  depression.  Felt like he was not going to be able to get over this on  his own.  Decided to be clean and sober from drugs and alcohol.  Denied  any active suicidal thoughts upon this evaluation, wanted help with his  depressed mood.  No hallucinations.  Cognition well-preserved.   ADMISSION DIAGNOSES:  AXIS I:  Major depressive disorder.  Alcohol  abuse.  Transgender.  AXIS II:  No diagnosis.  AXIS III:  High blood pressure, status post overdose.  AXIS IV:  Moderate.  AXIS V:  GAF upon admission 35; highest  GAF in the last year 30.   HOSPITAL COURSE:  He was admitted.  He was detoxed with Librium.  He was  given trazodone.  Started with Wellbutrin.  He was given some Seroquel.  Endorsed he tried to commit suicide, depression on and off, lost the  apartment, endorsed a build up of emotions, lots of issues, his  transgender.  Alcohol, five 40-ounce beers a day, increased use since he  lost his job, drinking more since he had been unemployed.  Cocaine  occasional.  Decreased energy, decreased motivation, crying, increased  sleep, decreased appetite.  When he was 41, tried to commit suicide by  overdose.  Endorsed depression.  He was in coma.  Liver enzymes were  elevated, SGOT 163, SGPT 201.  Internal medicine was consulted.  The  assessment was alcohol versus viral versus Tylenol.  Continued to have a  very hard time, especially at night, waking up in the middle of the  night, having suicidal thoughts.  Could contract for safety.  Was  thinking of ways that he could hurt himself while in the  unit.  Continued to pursue detox.  On October 20, 2006, had slept a little better.  As he woke up in the morning, still with suicidal ruminations but can  contract for safety.  Did endorse he has never been so upset with  suicide.  We increased the Seroquel at bedtime.  There was a family  session with the mother and the father.  Parents were supportive.  Endorsed that they did not agree with all of his choices in life but  they were willing to support him in every possible way.  Parents were  able to say that he has a lot of anger.  He refused to communicate with  them.  At times, he does not want to leave.  Further evaluation with his  liver with abdomen Doppler, did not seem to be anything acute.  The  conclusion was the alcohol was most possibly the cause of the increased  liver enzymes.  Continued to endorse suicidal ruminations.  On October 21, 2006, mood swings with irritability, agitation, avoiding interaction  with people.  We went ahead and switched to Zyprexa.  Seems that the  Seroquel was not helping.  There was some improvement with the Zyprexa.  Still not feeling right but there was marked decrease in the suicidal  rumination.  Still dealing with a lot of issues but better able to  process what was going on.  On October 25, 2006, he was markedly improved.  He endorsed no suicidal or homicidal ideation.  No hallucinations.  No  delusions.  He was in full contact with reality.  Endorsed he was  feeling better.  Willing to continue to work on an outpatient basis.  No  side effects to the medication.   DISCHARGE DIAGNOSES:  AXIS I:  Mood disorder not otherwise specified.  Alcohol and cocaine abuse.  Transgender.  AXIS II:  No diagnosis.  AXIS III:  High blood pressure, increased liver enzymes; rule out  secondary to alcohol.  AXIS IV:  Moderate.  AXIS V:  GAF upon discharge 60.   DISCHARGE MEDICATIONS: 1. Labetalol 100 mg, 2 twice a day.  2. Zyprexa Zydis 5 mg twice a day and 15  mg at bedtime.  3. Prozac 20 mg per day.  4. Trazodone 50 mg at bedtime as needed for sleep.   FOLLOW UP:  Dr. Lang Snow at [facility].  Geoffery Lyons, M.D.  Electronically Signed     IL/MEDQ  D:  12/01/2006  T:  12/01/2006  Job:  [id number]

## 2010-12-01 NOTE — Op Note (Signed)
[name], [name]                 ACCOUNT NO.:  [id number]   MEDICAL RECORD NO.:  [id number]          PATIENT TYPE:  OIB   LOCATION:  0098                         [facility]ACILITY[facility]   PHYSICIAN:  Juanetta Gosling, MDDATE OF BIRTH:  04-17-1981   DATE OF PROCEDURE:  02/20/2009  DATE OF DISCHARGE:                               OPERATIVE REPORT   PREOPERATIVE DIAGNOSIS:  Bilateral free  silicone injections resulting  in bilateral male breasts.   POSTOPERATIVE DIAGNOSIS:  Bilateral  free silicone injections resulting  in bilateral male breasts.   PROCEDURE:  Bilateral simple mastectomies.   SURGEON:  Juanetta Gosling, MD   ASSISTANT:  Gaynelle Adu, MD   ANESTHESIA:  General.   SPECIMENS:  Bilateral breasts to pathology.   ESTIMATED BLOOD LOSS:  800 cc.   DRAINS:  Four 19-French Blake drains.   COMPLICATIONS:  Unable to pass a Foley at the completion of the case;  obtained a Urology consult for passage of a Coude catheter.   DISPOSITION:  To recovery room in stable condition.   INDICATIONS:  [name] is a 30 year old male who at around the age of 70,  decided that he wanted to undergo a gender change.  He began hormones at  that time but his breasts were not getting large enough so he obtained  free silicone  injections to bilateral breasts in an attempt to give  himself  larger breasts.  Over time he has now decided that he is not  going to proceed with any more efforts to change his gender.  He has  stopped drinking and is now not using  any drugs as he is in school.  These breasts have become very hard and very sore and it has is causing  him some difficulty with performing many of his daily activities.  He is  currently involved in a step program.  He is going to meetings.  It  appears he is back on track with his life.  Due to that, he  and I had a  long discussion about bilateral simple mastectomies that will require  removing his nipples as well as possibly needing a  skin graft.  We  specifically talked about  the risks of wound complications after this  as well.   PROCEDURE:  After informed consent was obtained, the patient was taken  to the operating room.  He was administered 400 mg of IV ciprofloxacin  due to a penicillin allergy.  Sequential compression devices were placed  on the lower extremities prior to induction.  He was then placed under  general endotracheal anesthesia without complication.  His bilateral  chest was then prepped with ChloraPrep.  Three  minutes were allowed to  pass.  He was then draped in a standard sterile surgical fashion.  Surgical time-out was then performed.   The left breast was approached first.  An elliptical incision was made  to encompass the nipple.  Flaps were created in all directions to around  the level of the clavicle towards the sternum medially, inferiorly,  below the inframammary crease and laterally  towards the latissimus.  It  was very difficult to create his flaps and there were numerous pockets  of free silicone that were present as well as very hard rock like  structures consistent with his free silicone.  This was taken off of his  flaps as best as possible.  His pectoralis muscle was adherent to this  silicone and there was free silicone and silicone implants that were  attached to his ribs.  These were taken off with some difficulty.  I did  have to remove some of his pectoralis major as well as his pectoralis  minor muscle on this side.  There was a fair amount of bleeding during  this portion of procedure which was controlled with a combination of  electrocautery and 2-0 Vicryl suture ligatures.  Upon completion of the  this side, this was passed off the table and labeled as specimen and  this  area was packed.  Attention was then turned to the right side.   Again an elliptical incision was then made surrounding the nipple and  flaps were created in all directions.  The side was a little  bit easier  then the other side but still had a fair amount of silicone.  Medially  there was a pocket of  silicone that connected to the other side so the  two sides were connected in the middle.  The skin was very thin in one  portion on the superior flap but upon review later this appeared to be  viral and will require [place] following this.  This breast was again removed  in a similar fashion required some muscle to be removed as well.  This  breast was then passed off the table as specimen after being marked as  well.  This wound was then packed.  Attention was then turned to the  other side again.  Hemostasis was  obtained.  Irrigation was performed.  Two 19 French Blake drains were  inserted into this side as well.  Tisseel was sprayed over the wound.  I  did eventually excise some more skin on this side to allow this area to  close and be flat.  Vicryl 3-0 sutures were used to close the dermis and  4-0 Vicryl was used to bring the skin closer together and the skin was  coapted together with staples.  The  drains were functioning upon  completion.  The left side was closed in a similar fashion after hemostasis was  observed and irrigation was performed.  Two 19-French Blake drains were  also inserted on that side.  Tisseel was also placed on the right side  as well.  Vicryl 3-0 was used to close the dermis.  The skin was then  closed with staples.  A sterile dressing and a wrap was then placed  overlying all this.  We attempted to in-and-out catheterize and at the  completion of the case were unable to pass a Foley.  Urology consult  with Dr.  Heloise Purpura was  obtained and he was able to pass a Coude  catheter which will remain in place overnight.  He was extubated in the  operating room and was transferred to the recovery room in stable  condition.      Juanetta Gosling, MD  Electronically Signed     MCW/MEDQ  D:  02/20/2009  T:  02/20/2009  Job:  [id number]   cc:    Marcene Duos, M.D.

## 2010-12-01 NOTE — Discharge Summary (Signed)
NAMECHARAN, [name]                 ACCOUNT NO.:  [id number]   MEDICAL RECORD NO.:  [id number]          PATIENT TYPE:  INP   LOCATION:  1513                         FACILITY:  [facility]   PHYSICIAN:  Eduard Clos, MDDATE OF BIRTH:  26-Sep-1980   DATE OF ADMISSION:  11/13/2008  DATE OF DISCHARGE:  11/15/2008                               DISCHARGE SUMMARY   COURSE IN THE HOSPITAL:  A 30 year old male with known history of  chronic alcoholism.  [facility] was [facility] as the patient was found to be hallucinating and becoming  restless.  Patient admitted to a telemetry floor, initially a step-down  unit and transferred to telemetry floor.  The patient became more  stable.  He was placed on Ativan withdrawal protocol and CT of the head  done initially on admission did not show anything acute.  Once the  patient became more stable, he was transferred to telemetry floor.  A  psychiatric consult was obtained with Dr. Jeanie Sewer who advised the  patient to be referred to outpatient [facility] for further alcohol detox  as outpatient.  At this time as the patient's symptoms are largely  resolved, he was able to tolerate diet and the patient is eager to go  home, we will be discharging him home with his mother, and the patient  was advised no driving.  At the time of this dictation, the patient is  hemodynamically stable.   PROCEDURES DONE DURING THE STAY:  CT of the head without contrast on  November 13, 2008 showed no acute intracranial abnormality, stable  appearance of diffuse global atrophy.   PERTINENT LABORATORIES:  The patient's LFTs on November 11, 2008 were  showing AST of 213 and ALT of 117 which have decreased to 54 and 46 on  November 14, 2008 that is attributed to his alcohol intake.  His phenytoin  level was therapeutic and his acetaminophen level was less than 1p.  Drug screen was only positive for benzodiazepines, but it was positive  for cocaine on  November 07, 2008.  It was repeated and was negative.   FINAL DIAGNOSES:  1. Alcohol withdrawal.  2. Polysubstance abuse.  3. Hyperlipidemia.  4. Hypertension.  5. History of depression, and no suicidal ideation presently.   MEDICATIONS AT DISCHARGE:  1. The patient is on atenolol 25 mg p.o. daily.  2. Thiamine 100 mg p.o. daily.  3. Zoloft 100 mg 2 tabs p.o. daily.  4. Dilantin 200 mg p.o. b.i.d.  5. Trazodone 50 mg p.o. at bedtime p.r.n. insomnia.  6. Crestor 20 mg daily.   PLAN:  Patient advised to follow with his primary care physician as  scheduled.  Social work will be helping with  [facility].  Patient to  follow up with [facility] as scheduled for his alcohol detox and  polysubstance abuse.  To be on a cardiac healthy diet and no driving.  [name] activity as tolerated with fall precautions.  Plan discussed  with the patient's mother.      Eduard Clos, MD  Electronically Signed  ANK/MEDQ  D:  11/15/2008  T:  11/15/2008  Job:  [id number]

## 2010-12-01 NOTE — Assessment & Plan Note (Signed)
OFFICE VISIT   SAQIB, CAZAREZ  DOB:  10-15-1980                                       12/12/2009  ZOXWR#:[id number]   Patient presents today for follow-up of his recent left above-knee to  below-knee popliteal bypass.  Surgery was on 04/19.  He had no  complications in the hospital and was discharged to home without  difficulty.  He reports the usual amount of soreness around the incision  and also the usual amount of postoperative swelling in his calf and  ankle.  He is walking without claudication symptoms.   Today he does have palpable posterior tibial pulses.  His wounds are  healing quite nicely.  His foot is well-perfused.  He underwent  noninvasive vascular laboratory studies today showing triphasic  waveforms in both feet and also normal ankle-arm index.   I am quite pleased with his initial result.  We will see him again in 3  months for a vascular lab protocol follow-up.     Larina Earthly, M.D.  Electronically Signed   TFE/MEDQ  D:  12/12/2009  T:  12/12/2009  Job:  [id number]   cc:   Marcene Duos, M.D.

## 2010-12-04 NOTE — Discharge Summary (Signed)
NAMEVINCENZO, [name]                 ACCOUNT NO.:  [id number]   MEDICAL RECORD NO.:  [id number]          PATIENT TYPE:  INP   LOCATION:  1409                         FACILITY:  [facility]   PHYSICIAN:  Beckey Rutter, MD  DATE OF BIRTH:  05-17-1981   DATE OF ADMISSION:  10/30/2006  DATE OF DISCHARGE:  11/02/2006                               DISCHARGE SUMMARY   CHIEF COMPLAINT ON ADMISSION:  Overdose with suicide intention.   HISTORY OF PRESENT ILLNESS:  Briefly, 30 years old, came to the  emergency room after found unresponsive by family with suicide note  alongside.  The patient had taken about 30 pills of trazodone tablets  and 20 pills of Tylenol PM.   HOSPITAL COURSE:  During hospitalization, the patient was kept with one-  to-one for psych observation and he was evaluated by psychiatrist  DR.Wiliford with the recommendation to transfer the patient to inpatient  psych facility.  Medically, he had elevation rn liver function tests.  He took Tylenol for his suicide attemption and during his  hospitalization, the patient received MucoMyst regimen.  His liver  function tests trending down.  The latest liver function test done  showing bilirubin, total bilirubin is 0.6, alkaline phosphatase is 48,  AST 40, ALT 57, total protein 6.4, albumin in the blood is 3.3, and  calcium 9.1.  The rest of his labs reading is as follows:  Sodium 135,  potassium 4, chloride 102, bicarbonate 26, glucose 85, BUN 2.  Creatinine is 0.79.  His estimated GFR is more than 60 by both methods.  The patient had some elevated in ALT and AST and as read above, now they  are in the upper normal range, but mainly those results have been  trending down since the admission.  Clinically the patient is also  stable and ambulating fairly.  He is medically stable to be transferred  to the inpatient psych floor.   DISCHARGE DIAGNOSES:  1. Suicide attemption with trazodone and Tylenol.  2. Elevation in the liver function  tests.   DISCHARGE MEDICATIONS:  1. Prozac 20 mg p.o. daily.  2. Seroquel 50 mg p.o. nightly.  3. Senokot, 1 to 2 tabs p.o. nightly.   DISCHARGE PLANNING:  The patient is stable medically to be  transferred/discharged to inpatient facility since he needs to be  stabilized.  Especially that he had second attempt within a month for  suicide.      Beckey Rutter, MD  Electronically Signed     EME/MEDQ  D:  11/02/2006  T:  11/02/2006  Job:  [id number]

## 2010-12-04 NOTE — H&P (Signed)
NAMEKEITA, [name]                 ACCOUNT NO.:  [id number]   MEDICAL RECORD NO.:  [id number]          PATIENT TYPE:  INP   LOCATION:  1231                         FACILITY:  [facility]   PHYSICIAN:  Della Goo, M.D. DATE OF BIRTH:  Feb 16, 1981   DATE OF ADMISSION:  10/31/2006  DATE OF DISCHARGE:                              HISTORY & PHYSICAL   CHIEF COMPLAINT:  Overdose.   HISTORY OF PRESENT ILLNESS:  This is a 30 year old male who was brought  to the emergency department at [facility] after being found  unresponsive by his family and a suicide note left along side, which  stated that he had taken #30 trazodone tablets and #20 Tylenol PM  tablets in an attempt to commit suicide.  The patient in this note  stated that he was tired of being a burden and a failure.  The patient  had a previous attempt on April1,2008, taking similar medications.  He  was evaluated and stayed in [facility] for approximately 1 week  and was discharged home only a few days ago.   In the emergency department, the patient was evaluated.  An NG tube was  placed and the patient was administered activated charcoal along with  initiation of oral Mucomyst therapy at a loading dose.  His initial drug  screen returned positive for benzodiazepines, alcohol level less than 5,  and the acetaminophen level returned at 2346 at a level of 101.5.   This patient began to have increased responsiveness and on interview,  the patient did reiterate that he was trying to kill himself.  He  reports being depressed on and off for many years and is not able to  pinpoint any specific incident or situation to his depressive symptoms.  The patient also reports that he does have a drinking problem and prior  to his hospitalization April1, he had been drinking four 40-ounce beers  daily.  He also does report occasional cocaine usage but reports he has  not used prior to his last hospitalization.   The patient  does report that this is his fourth attempt at suicide.  His  first attempt was when he was 30 years old.   PAST MEDICAL HISTORY:  1. Severe depression.  2. Hypertension which has been newly diagnosed.   PAST SURGICAL HISTORY:  None.   MEDICATIONS:  1. Labetalol 100 mg 2 p.o. b.i.d.  2. Zyprexa 5 mg p.o. b.i.d. and 15 mg p.o. nightly.  3. Prozac 20 mg 1 p.o. daily.  4. Trazodone 50 mg p.o. nightly.   ALLERGIES:  PENICILLIN which causes a rash.   SOCIAL HISTORY:  Single, unemployed male.  Positive smoking history of 2  cigarettes daily, [place] cigarettes.  Heavy alcohol usage as mentioned  above, and history of cocaine usage.   FAMILY HISTORY:  Mother with hypertension and maternal family with type  2 diabetes mellitus.  Maternal great-grandmother with coronary artery  disease, and cancer in a maternal aunt who had breast cancer.  The  patient's father had a history of alcoholism.  Mother has a history of  depression.   PHYSICAL EXAMINATION FINDINGS:  GENERAL:  A 30 year old well-nourished,  well-developed male in no acute distress or discomfort currently.  VITAL  SIGNS:  Temperature 98, blood pressure 142/92, heart rate 114 to 120,  respirations 20, and O2 saturation is 97% to 99%.  HEENT:  Normocephalic, atraumatic.  Pupils equally round, sluggish but  reactive to light.  Extraocular muscles are intact.  Funduscopic:  Benign.  Oropharynx is clear.  NECK:  Supple.  Full range of motion.  No thyromegaly, adenopathy,  jugular venous distention.  CARDIOVASCULAR:  Tachycardiac rate and rhythm.  No murmurs, gallops or  rubs.  LUNGS:  Clear to auscultation bilaterally.  ABDOMEN:  Positive bowel sounds, soft, nontender, nondistended.  EXTREMITIES:  Without cyanosis, clubbing or edema.  NEUROLOGIC EXAMINATION:  The patient is alert and oriented to name,  time, and place.  There are no focal deficits.   LABORATORY STUDIES:  White blood cell count 7.1, hemoglobin 13.7, MCV  89.3,  platelets 283, neutrophils 54% lymphocytes 31%.  Pro time 12.4,  INR 0.9.  Sodium 137, potassium 3.6, chloride 103, CO2 of 25, BUN 4,  creatinine 0.77, glucose 114, calcium 9, albumin 3.5, AST 52.  Alcohol  level less than 5.  Benzodiazepines are positive in the urine drug  screen, and the acetaminophen level is 101.5.   ASSESSMENT:  A 30 year old male being admitted with a suicide  attempt/overdose.  Other diagnoses include hypertension, polysubstance  abuse, and severe depression.   PLAN:  The patient will be admitted to the step-down unit for one-to-one  monitoring.  He will continue on Mucomyst therapy which will be  administered IV.  He has also been placed on suicide precautions and  psychiatry will be consulted for further therapy.  The patient has been  placed on GI and DVT prophylaxis.  Orders have been written for  Lopressor IV every 6 hours for elevated blood pressure with hold  parameters.  Benadryl therapy has also been written in the event of an  allergic reaction to Mucomyst therapy such as worsening tachycardia,  flushing, itching, and other anaphylactoid symptoms.  This is an  unassigned patient.      Della Goo, M.D.  Electronically Signed     HJ/MEDQ  D:  10/31/2006  T:  10/31/2006  Job:  [id number]

## 2010-12-04 NOTE — H&P (Signed)
[name], [name]                 ACCOUNT NO.:  [id number]   MEDICAL RECORD NO.:  [id number]          PATIENT TYPE:  IPS   LOCATION:  0501                          FACILITY:  [facility]   PHYSICIAN:  [name], M.D.      DATE OF BIRTH:  Jan 21, 1981   DATE OF ADMISSION:  10/18/2006  DATE OF DISCHARGE:                       PSYCHIATRIC ADMISSION ASSESSMENT   IDENTIFYING INFORMATION:  This is a 30 year old African American male  who is single.  This is a voluntary admission.   HISTORY OF PRESENT ILLNESS:  This patient presented in the emergency  room after taking an entire bottle of a Tylenol PM.  His acetaminophen  levels were 56 and then 23 sequentially.  He had had several beers to  drink and his alcohol level was 145.  He also endorsed having been using  cocaine and his urine drug screen was positive for cocaine.  He reports  his stressors were having lost his job approximately 1 month prior to  the episode and, because of his financial problems, and lost his  apartment.  His family is supportive but he was feeling hopeless and  depressed.  Prior to losing his job, he been drinking about five beers  every other day, not using alcohol daily.  His drinking has escalated  since his job loss to daily and he has been  drinking about five beers a  day.  His use of cocaine has also increased from occasional use on the  weekends to several days in the past couple of weeks.  He endorses  increased anhedonia, a strong sense of loss, reclusiveness, hypersomnia,  staying in bed as much as 10 hours a day, and decreased appetite.  Has  not had any close friends that he felt that he could really share his  problems with and feels that the fact that he has been so reclusive and  not accessing his support system is one of the triggers for his suicidal  thought.  He is receptive to treatment.  No history of homicidal  thoughts or suicidal thoughts.   PAST PSYCHIATRIC HISTORY:  This is his [facility]  admission to [facility].  Has never received medications for depression  in the past.  Has never received formal counseling.  He has a history of  one prior suicide attempt at age 58 by overdose and, at that time, was  comatose.  He had overdosed on a medication of his mother's which is  unknown.  He does endorse history of depressive episodes with anhedonia  and dysphoria.  He has a history of using alcohol for the last 2-3 years  and cocaine socially on weekends for about 1 year.   FAMILY HISTORY:  Is remarkable for mother with a history of depression  with hospitalization and father with alcoholism.   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  He had been employed there for about a  year and half.  Had some conflict with the supervisor.  Has been living  alone.  Recently lost his apartment.  Plans to go and stay with his  parents who are supportive.  No current legal charges.  He endorses a  transsexual lifestyle.  He has never married.  No children.   MEDICAL HISTORY:  The patient's primary care [name] is unclear.  Medical problems are pruritus at this point, rule out scabies, and  elevated blood pressure, rule out hypertension.  The patient endorses a  history of dyslipidemia and some history of elevated blood pressure,  gastroenteritis.  Was treated 2 months ago in the emergency room.  Denies any history of seizures, blackouts or memory loss.   MEDICATIONS:  Currently are none.   DRUG ALLERGIES:  Are none.   No history of psychotropic medications.   POSITIVE PHYSICAL FINDINGS:  Well-nourished, well-developed male,  muscular, in no acute distress, pleasant, cooperative.  VITAL SIGNS:  Afebrile, temp 98.6, pulse 88, respirations 20, blood  pressure 157/111, 5 feet 6 inches tall, 75 kg.  Most notable on his  physical exam today is he has some pruritus between his fingers,   intertriginous, in  the folds of his skin, groin, back of his neck,  having quite a bit of pruritus but no real erythema, no urticaria but  some small, raised bumps in these areas and in the intertriginous  surfaces.  He had been given some cortisone cream earlier that is not  helping.  He also has significant acne scarring on his back and  shoulders.   DIAGNOSTIC STUDIES:  The CBC is within normal limits.  Hemoglobin is  mildly elevated at 17.1.  Platelets normal at 259,000.  Chemistries -  sodium 140, potassium 3.4, chloride 101, carbon dioxide 28, BUN 3,  creatinine 1.06, and random glucose 95.  Alcohol level was 145 in the  emergency room.  Urine drug screen was positive for cocaine.  A UA was  remarkable for 300 mg of protein and was positive for ketones.  His TSH  and liver enzymes are pending.   MENTAL STATUS EXAM:  Fully alert male.  He goes by the name of [name].  Cooperative, pleasant, polite, in a gown.  Fluent speech.  Affect is  somewhat blunted.  He appears to be somewhat depressed but articulates  well.  Speech is a bit soft in tone but normal in pace, tone, and  production, fluent and articulate.  Mood is depressed.  He does endorse  the desire to be treated with antidepressant medications.  His mother  had a history of depression.  He feels that an antidepressant would help  him quite a bit.  Feels unable to get over this on his own.  Endorses  the desire to be clean and sober from drugs and alcohol.  Feels that  this is dragging him down and holding him back from his full potential.  Denies suicidal thoughts today and is requesting help with his depressed  mood.  Cognition is well preserved.  Insight adequate.  Impulse control  and judgment within normal limits.  Concentration and calculation are  intact.   AXIS I:  Depressive disorder, not otherwise specified, rule out major  depressive disorder, recurrent, severe.  AXIS II:  Deferred. AXIS III:  Elevated blood  pressure, rule out hypertension, status post  acetaminophen and diphenhydramine overdose.  AXIS IV:  Severe issues with job loss and financial problems.  Having  supportive parents and a stable home situation that he can go to is an  asset to him.  AXIS  V:  Current 39, past year 48-72.   PLAN:  Is to voluntarily admit the patient with q. 15-minute checks in  place, to alleviate his dysphoria and suicidal thought, and provide a  safe detox from alcohol.  Plan is to place him on a Librium protocol,  which we have already started and he is tolerating well.  We are going  to start him on Wellbutrin XL 150 mg daily, which we will start a little  bit later today after we have loaded him with Librium.  We are going to  check hepatic enzymes and a TSH and check an EKG since he did overdose  and diphenhydramine.  We are going to get some additional information  about  his hypertension and will probably give him some medications to get that  under control when we get a little better history on that.  Meanwhile,  we are giving him some Prometrium 5% cream for his pruritus and will  refer him to his primary care practitioner to follow up with his  proteinuria.  Estimated length of stay is 5 days.      Margaret A. Scott, N.P.      [name], M.D.  Electronically Signed    MAS/MEDQ  D:  10/18/2006  T:  10/18/2006  Job:  [id number]

## 2010-12-07 ENCOUNTER — Encounter (INDEPENDENT_AMBULATORY_CARE_PROVIDER_SITE_OTHER): Payer: Self-pay

## 2010-12-07 DIAGNOSIS — I70219 Atherosclerosis of native arteries of extremities with intermittent claudication, unspecified extremity: Secondary | ICD-10-CM

## 2010-12-07 DIAGNOSIS — Z48812 Encounter for surgical aftercare following surgery on the circulatory system: Secondary | ICD-10-CM

## 2010-12-09 NOTE — Procedures (Unsigned)
BYPASS GRAFT EVALUATION  INDICATION:  Followup left above knee to below knee popliteal bypass graft.  HISTORY: Diabetes:  No. Cardiac:  No. Hypertension:  Yes. Smoking:  Previously. Previous Surgery:  Left above knee to below knee bypass graft with reverse saphenous vein 11/04/2009 Dr. Arbie Cookey.  SINGLE LEVEL ARTERIAL EXAM                              RIGHT              LEFT Brachial:                    136                133 Anterior tibial:             151                149 Posterior tibial:            147                155 Peroneal: Ankle/brachial index:        1.10               1.09  PREVIOUS ABI:  Date:  06/30/2010  RIGHT:  1.03  LEFT:  1.00  LOWER EXTREMITY BYPASS GRAFT DUPLEX EXAM:  DUPLEX:  Patent left above knee to below knee popliteal bypass graft with triphasic and biphasic waveforms noted proximal, within and distally.  The left native arteries appear within normal limits with normal velocities.  Mild atherosclerosis visualized within left native arteries.  IMPRESSION:  Stable ankle brachial index.  Patent left above knee to below knee popliteal bypass graft.         ___________________________________________ Larina Earthly, M.D.  OD/MEDQ  D:  12/07/2010  T:  12/07/2010  Job:  [id number]

## 2010-12-26 IMAGING — CT CT HEAD W/O CM
1 of 2 series · 13 of 30 positions shown, 17 images · non-contrast
Comparison: None.

CLINICAL DATA: 2 seizures today.  No prior history of seizures.
Headache.  Right supraorbital laceration.

CT HEAD WITHOUT CONTRAST 07/24/2008:
TECHNIQUE: Contiguous axial images were obtained from the base of
the skull through the vertex without contrast.

[Series 2: head_seq 4.5 h37s st · axial · 0.43mm/px · z∈[-76,+41]mm · 13 of 32 slices shown, 17 images]
[im 3/32  brain]
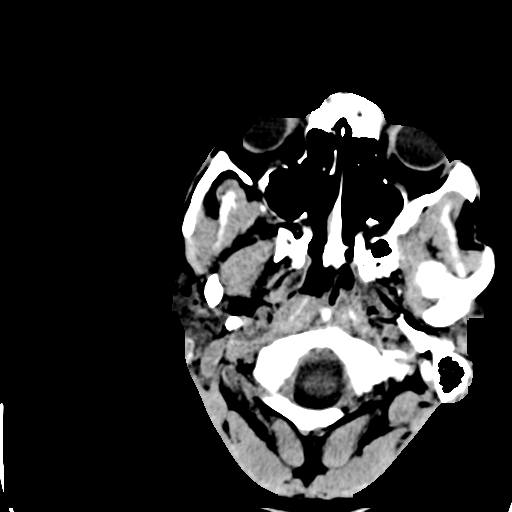
[im 3/32  bone]
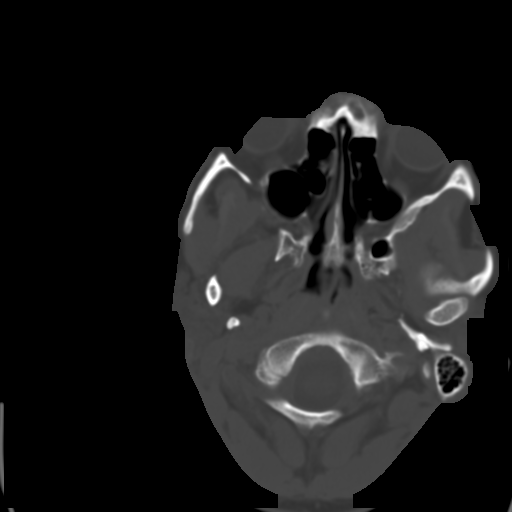
[im 5/32  brain]
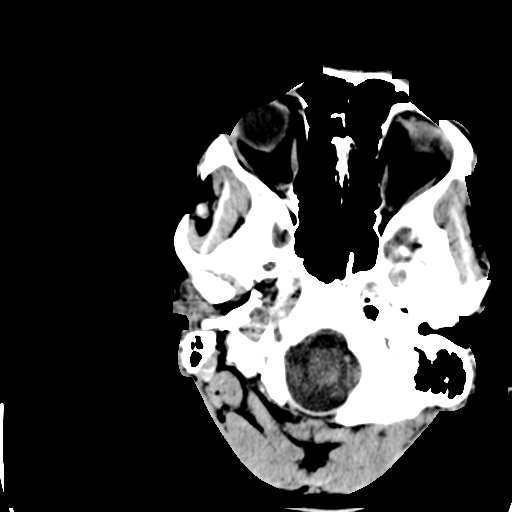
[im 7/32  brain]
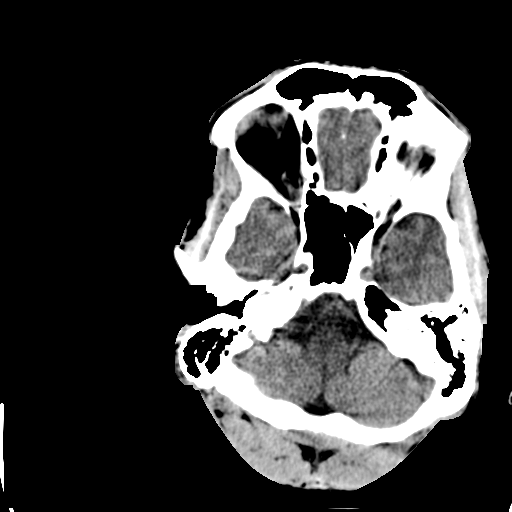
[im 9/32  brain]
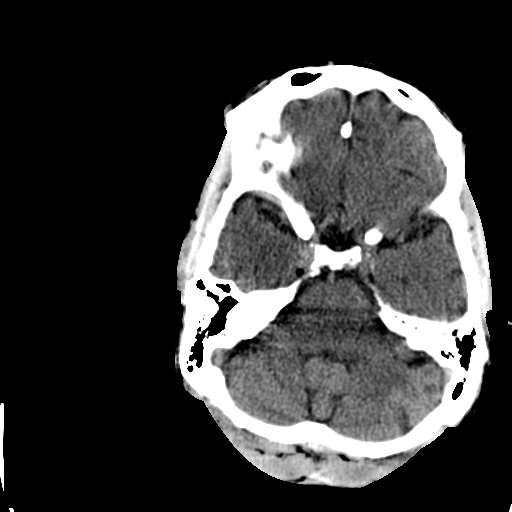
[im 12/32  brain]
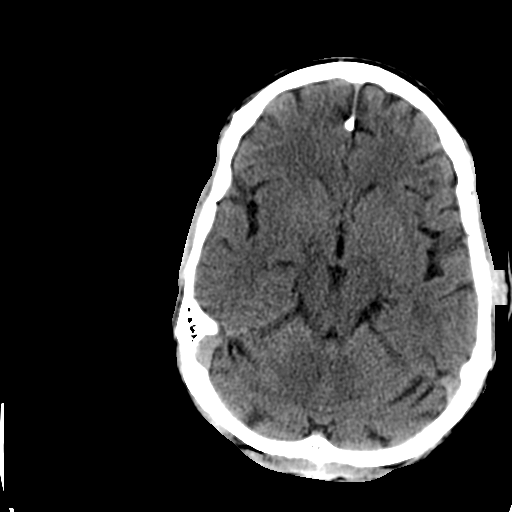
[im 12/32  bone]
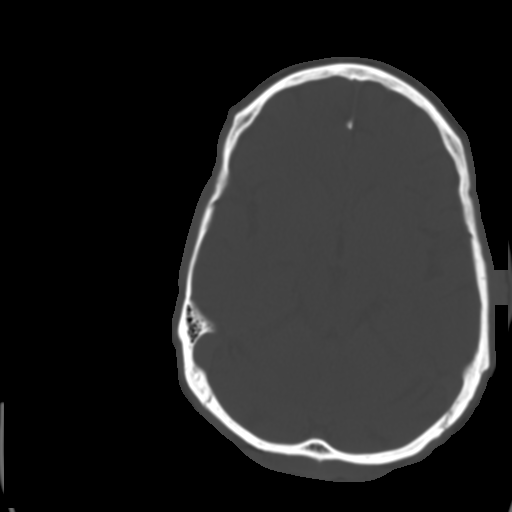
[im 14/32  brain]
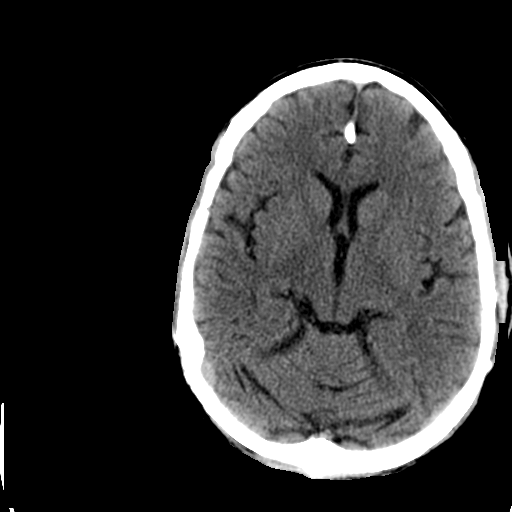
[im 16/32  brain]
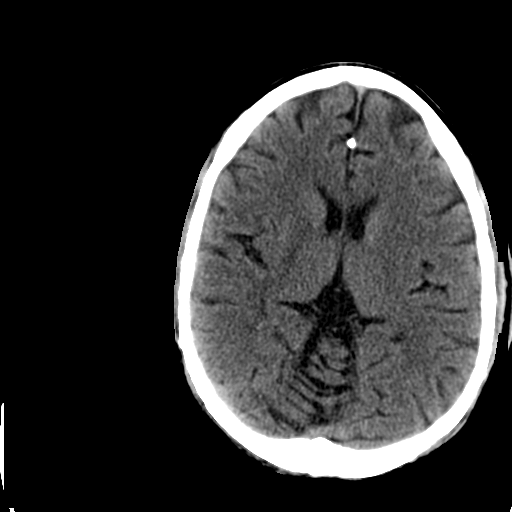
[im 18/32  brain]
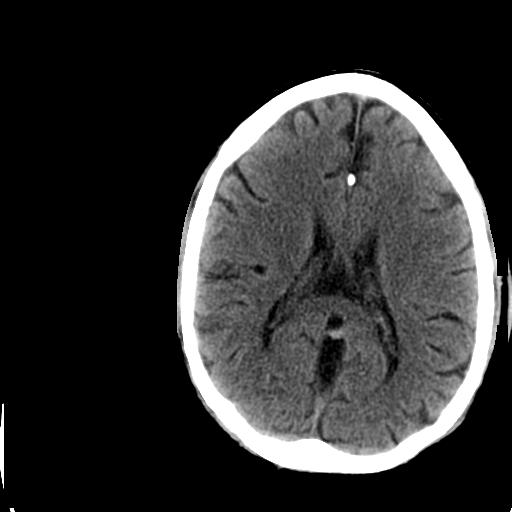
[im 20/32  brain]
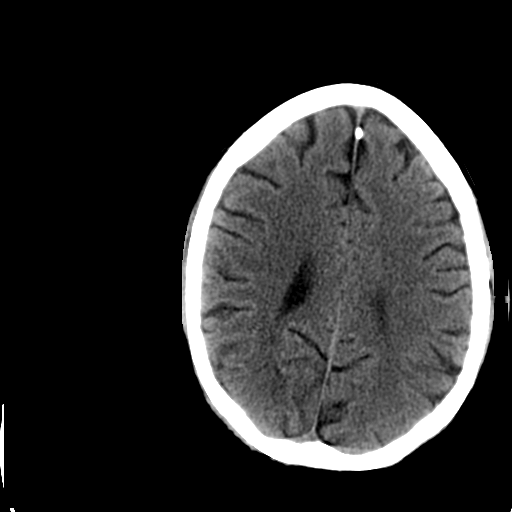
[im 20/32  bone]
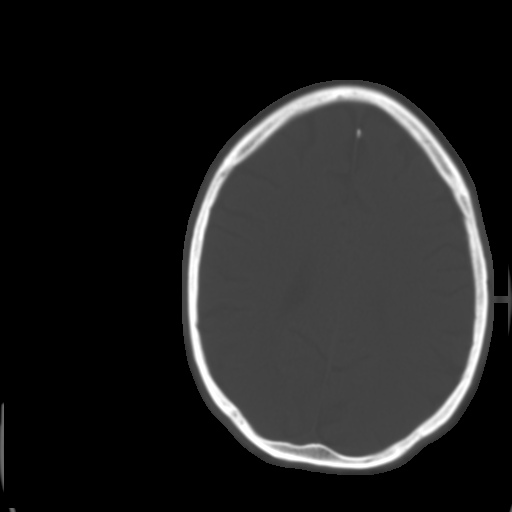
[im 23/32  brain]
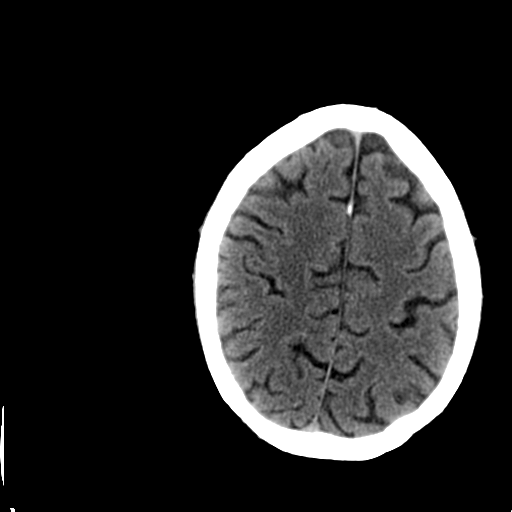
[im 25/32  brain]
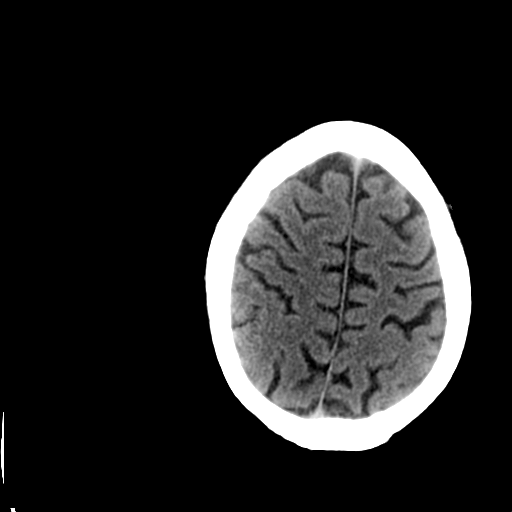
[im 27/32  brain]
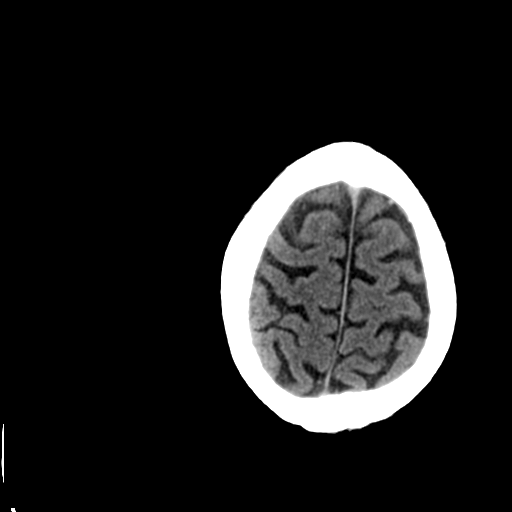
[im 29/32  brain]
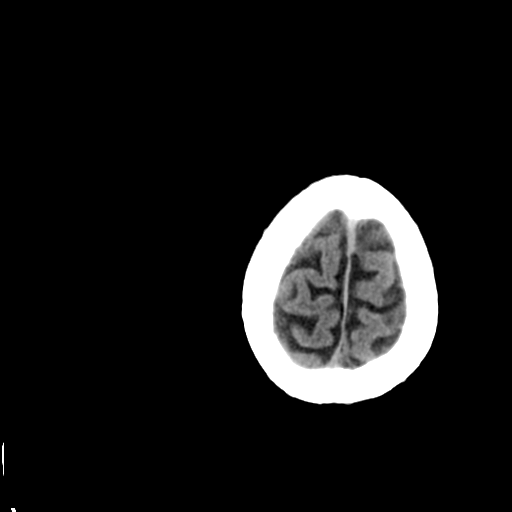
[im 29/32  bone]
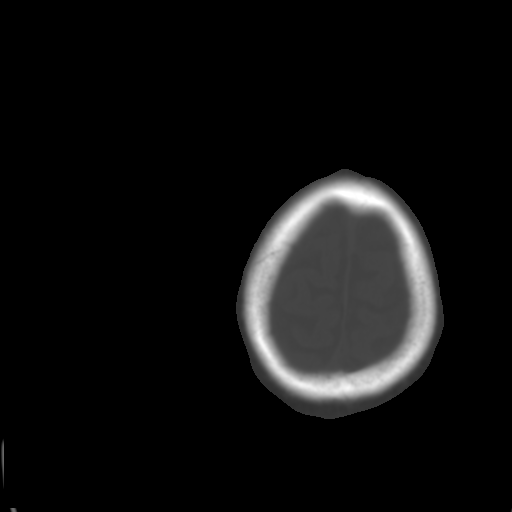

[13 of 30 positions shown; findings below may reference images not displayed]

FINDINGS: Moderate cortical atrophy and severe cerebellar vermian
atrophy for age.  Ventricular system normal in size appearance.
Cavum septum pellucidum and cavum vergae.  No mass lesion.  No
acute hemorrhage or hematoma.  No extra-axial fluid collections.
No evidence of acute infarction.  No focal brain parenchymal
abnormality.

No focal osseous abnormality involving the skull.  Incidental
approximate 4 mm calcified meningioma arising from the inner table
of the right frontal bone near the vertex.  Visualized paranasal
sinuses, mastoid air cells, and middle ear cavities well-aerated.
Calcification in the falx anteriorly.
IMPRESSION: 1.  No acute intracranial abnormalities.
2.  Moderate cortical atrophy and severe cerebellar vermian atrophy
for age.

## 2010-12-30 ENCOUNTER — Emergency Department (HOSPITAL_COMMUNITY)
Admission: EM | Admit: 2010-12-30 | Discharge: 2010-12-30 | Disposition: A | Discharge status: Home / Self Care | Payer: Self-pay | Attending: Emergency Medicine | Admitting: Emergency Medicine

## 2010-12-30 DIAGNOSIS — E7800 Pure hypercholesterolemia, unspecified: Secondary | ICD-10-CM | POA: Insufficient documentation

## 2010-12-30 DIAGNOSIS — R1110 Vomiting, unspecified: Secondary | ICD-10-CM | POA: Insufficient documentation

## 2010-12-30 DIAGNOSIS — F209 Schizophrenia, unspecified: Secondary | ICD-10-CM | POA: Insufficient documentation

## 2010-12-30 DIAGNOSIS — F1010 Alcohol abuse, uncomplicated: Secondary | ICD-10-CM | POA: Insufficient documentation

## 2010-12-30 DIAGNOSIS — F319 Bipolar disorder, unspecified: Secondary | ICD-10-CM | POA: Insufficient documentation

## 2010-12-30 DIAGNOSIS — I10 Essential (primary) hypertension: Secondary | ICD-10-CM | POA: Insufficient documentation

## 2010-12-30 LAB — POCT I-STAT, CHEM 8
Chloride: 104 mEq/L (ref 96–112)
HCT: 46 % (ref 39.0–52.0)
Hemoglobin: 15.6 g/dL (ref 13.0–17.0)
Potassium: 3.7 mEq/L (ref 3.5–5.1)
Sodium: 141 mEq/L (ref 135–145)

## 2010-12-30 LAB — OCCULT BLOOD, POC DEVICE: Fecal Occult Bld: NEGATIVE

## 2010-12-31 ENCOUNTER — Inpatient Hospital Stay (HOSPITAL_COMMUNITY)
Admission: EM | Admit: 2010-12-31 | Discharge: 2011-01-07 | DRG: 440 | Disposition: A | Discharge status: Other Acute Inpatient Hospital | Payer: Self-pay | Attending: Internal Medicine | Admitting: Internal Medicine

## 2010-12-31 DIAGNOSIS — I739 Peripheral vascular disease, unspecified: Secondary | ICD-10-CM | POA: Diagnosis present

## 2010-12-31 DIAGNOSIS — T448X1A Poisoning by centrally-acting and adrenergic-neuron-blocking agents, accidental (unintentional), initial encounter: Secondary | ICD-10-CM | POA: Diagnosis present

## 2010-12-31 DIAGNOSIS — T426X2A Poisoning by other antiepileptic and sedative-hypnotic drugs, intentional self-harm, initial encounter: Secondary | ICD-10-CM | POA: Diagnosis present

## 2010-12-31 DIAGNOSIS — F319 Bipolar disorder, unspecified: Secondary | ICD-10-CM | POA: Diagnosis present

## 2010-12-31 DIAGNOSIS — F1410 Cocaine abuse, uncomplicated: Secondary | ICD-10-CM | POA: Diagnosis present

## 2010-12-31 DIAGNOSIS — Y92009 Unspecified place in unspecified non-institutional (private) residence as the place of occurrence of the external cause: Secondary | ICD-10-CM

## 2010-12-31 DIAGNOSIS — K859 Acute pancreatitis without necrosis or infection, unspecified: Principal | ICD-10-CM | POA: Diagnosis present

## 2010-12-31 DIAGNOSIS — I10 Essential (primary) hypertension: Secondary | ICD-10-CM | POA: Diagnosis present

## 2010-12-31 DIAGNOSIS — G40909 Epilepsy, unspecified, not intractable, without status epilepticus: Secondary | ICD-10-CM | POA: Diagnosis present

## 2010-12-31 DIAGNOSIS — T466X4A Poisoning by antihyperlipidemic and antiarteriosclerotic drugs, undetermined, initial encounter: Secondary | ICD-10-CM | POA: Diagnosis present

## 2010-12-31 DIAGNOSIS — T426X1A Poisoning by other antiepileptic and sedative-hypnotic drugs, accidental (unintentional), initial encounter: Secondary | ICD-10-CM | POA: Diagnosis present

## 2010-12-31 DIAGNOSIS — E785 Hyperlipidemia, unspecified: Secondary | ICD-10-CM | POA: Diagnosis present

## 2010-12-31 DIAGNOSIS — G609 Hereditary and idiopathic neuropathy, unspecified: Secondary | ICD-10-CM | POA: Diagnosis present

## 2010-12-31 DIAGNOSIS — Z79899 Other long term (current) drug therapy: Secondary | ICD-10-CM

## 2010-12-31 DIAGNOSIS — T420X1A Poisoning by hydantoin derivatives, accidental (unintentional), initial encounter: Secondary | ICD-10-CM | POA: Diagnosis present

## 2010-12-31 DIAGNOSIS — T43294A Poisoning by other antidepressants, undetermined, initial encounter: Secondary | ICD-10-CM | POA: Diagnosis present

## 2010-12-31 DIAGNOSIS — L299 Pruritus, unspecified: Secondary | ICD-10-CM | POA: Diagnosis not present

## 2010-12-31 DIAGNOSIS — T43501A Poisoning by unspecified antipsychotics and neuroleptics, accidental (unintentional), initial encounter: Secondary | ICD-10-CM | POA: Diagnosis present

## 2010-12-31 DIAGNOSIS — T50992A Poisoning by other drugs, medicaments and biological substances, intentional self-harm, initial encounter: Secondary | ICD-10-CM | POA: Diagnosis present

## 2010-12-31 DIAGNOSIS — F10229 Alcohol dependence with intoxication, unspecified: Secondary | ICD-10-CM | POA: Diagnosis present

## 2010-12-31 DIAGNOSIS — T43502A Poisoning by unspecified antipsychotics and neuroleptics, intentional self-harm, initial encounter: Secondary | ICD-10-CM | POA: Diagnosis present

## 2010-12-31 DIAGNOSIS — G4700 Insomnia, unspecified: Secondary | ICD-10-CM | POA: Diagnosis not present

## 2010-12-31 DIAGNOSIS — Z880 Allergy status to penicillin: Secondary | ICD-10-CM

## 2010-12-31 DIAGNOSIS — IMO0002 Reserved for concepts with insufficient information to code with codable children: Secondary | ICD-10-CM

## 2010-12-31 DIAGNOSIS — D6959 Other secondary thrombocytopenia: Secondary | ICD-10-CM | POA: Diagnosis present

## 2010-12-31 DIAGNOSIS — D72819 Decreased white blood cell count, unspecified: Secondary | ICD-10-CM | POA: Diagnosis present

## 2010-12-31 DIAGNOSIS — T4272XA Poisoning by unspecified antiepileptic and sedative-hypnotic drugs, intentional self-harm, initial encounter: Secondary | ICD-10-CM | POA: Diagnosis present

## 2010-12-31 LAB — RAPID URINE DRUG SCREEN, HOSP PERFORMED
Benzodiazepines: NOT DETECTED
Cocaine: NOT DETECTED
Opiates: NOT DETECTED

## 2011-01-01 LAB — SALICYLATE LEVEL: Salicylate Lvl: <2 mg/dL — ABNORMAL LOW (ref 2.8–20.0)

## 2011-01-01 LAB — CBC
HCT: 42.9 % (ref 39.0–52.0)
MCH: 30.8 pg (ref 26.0–34.0)
MCV: 85.3 fL (ref 78.0–100.0)
Platelets: 115 10*3/uL — ABNORMAL LOW (ref 150–400)
RBC: 5.03 MIL/uL (ref 4.22–5.81)
RDW: 15.7 % — ABNORMAL HIGH (ref 11.5–15.5)

## 2011-01-01 LAB — COMPREHENSIVE METABOLIC PANEL
Albumin: 3.7 g/dL (ref 3.5–5.2)
Alkaline Phosphatase: 76 U/L (ref 39–117)
BUN: 7 mg/dL (ref 6–23)
Chloride: 101 mEq/L (ref 96–112)
Creatinine, Ser: 0.71 mg/dL (ref 0.4–1.5)
GFR calc Af Amer: >60 mL/min (ref >60)
Glucose, Bld: 92 mg/dL (ref 70–99)
Potassium: 3.6 mEq/L (ref 3.5–5.1)
Total Bilirubin: 0.2 mg/dL — ABNORMAL LOW (ref 0.3–1.2)

## 2011-01-01 LAB — MRSA PCR SCREENING: MRSA by PCR: NEGATIVE

## 2011-01-01 LAB — DIFFERENTIAL
Eosinophils Absolute: 0 10*3/uL (ref 0.0–0.7)
Eosinophils Relative: 0 % (ref 0–5)
Lymphocytes Relative: 37 % (ref 12–46)
Lymphs Abs: 1.2 10*3/uL (ref 0.7–4.0)
Monocytes Relative: 12 % (ref 3–12)

## 2011-01-01 LAB — ETHANOL: Alcohol, Ethyl (B): 362 mg/dL — ABNORMAL HIGH (ref 0–10)

## 2011-01-02 DIAGNOSIS — K859 Acute pancreatitis without necrosis or infection, unspecified: Secondary | ICD-10-CM

## 2011-01-02 DIAGNOSIS — F1020 Alcohol dependence, uncomplicated: Secondary | ICD-10-CM

## 2011-01-02 DIAGNOSIS — F319 Bipolar disorder, unspecified: Secondary | ICD-10-CM

## 2011-01-02 LAB — DIFFERENTIAL
Basophils Relative: 0 % (ref 0–1)
Eosinophils Absolute: 0 10*3/uL (ref 0.0–0.7)
Lymphocytes Relative: 30 % (ref 12–46)
Lymphs Abs: 1 10*3/uL (ref 0.7–4.0)
Neutro Abs: 2 10*3/uL (ref 1.7–7.7)

## 2011-01-02 LAB — CBC
HCT: 37.7 % — ABNORMAL LOW (ref 39.0–52.0)
Hemoglobin: 12.8 g/dL — ABNORMAL LOW (ref 13.0–17.0)
MCV: 87.3 fL (ref 78.0–100.0)
RBC: 4.32 MIL/uL (ref 4.22–5.81)
WBC: 3.4 10*3/uL — ABNORMAL LOW (ref 4.0–10.5)

## 2011-01-02 LAB — COMPREHENSIVE METABOLIC PANEL
Albumin: 3 g/dL — ABNORMAL LOW (ref 3.5–5.2)
Alkaline Phosphatase: 63 U/L (ref 39–117)
BUN: 6 mg/dL (ref 6–23)
CO2: 27 mEq/L (ref 19–32)
Chloride: 102 mEq/L (ref 96–112)
GFR calc Af Amer: >60 mL/min (ref >60)
GFR calc non Af Amer: >60 mL/min (ref >60)
Glucose, Bld: 95 mg/dL (ref 70–99)
Potassium: 3.1 mEq/L — ABNORMAL LOW (ref 3.5–5.1)
Total Bilirubin: 0.5 mg/dL (ref 0.3–1.2)

## 2011-01-02 LAB — LIPASE, BLOOD: Lipase: 12 U/L (ref 11–59)

## 2011-01-03 LAB — COMPREHENSIVE METABOLIC PANEL
BUN: <3 mg/dL — ABNORMAL LOW (ref 6–23)
Calcium: 9.2 mg/dL (ref 8.4–10.5)
GFR calc Af Amer: >60 mL/min (ref >60)
Glucose, Bld: 90 mg/dL (ref 70–99)
Sodium: 137 mEq/L (ref 135–145)
Total Protein: 6.7 g/dL (ref 6.0–8.3)

## 2011-01-03 LAB — DIFFERENTIAL
Basophils Relative: 0 % (ref 0–1)
Eosinophils Relative: 3 % (ref 0–5)
Monocytes Absolute: 0.5 10*3/uL (ref 0.1–1.0)
Monocytes Relative: 13 % — ABNORMAL HIGH (ref 3–12)
Neutro Abs: 1.9 10*3/uL (ref 1.7–7.7)

## 2011-01-03 LAB — CBC
HCT: 40.2 % (ref 39.0–52.0)
Hemoglobin: 14.6 g/dL (ref 13.0–17.0)
MCH: 31.1 pg (ref 26.0–34.0)
MCHC: 36.3 g/dL — ABNORMAL HIGH (ref 30.0–36.0)
RDW: 15.5 % (ref 11.5–15.5)

## 2011-01-03 LAB — MAGNESIUM: Magnesium: 1.7 mg/dL (ref 1.5–2.5)
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
## 2011-01-04 DIAGNOSIS — F0630 Mood disorder due to known physiological condition, unspecified: Secondary | ICD-10-CM

## 2011-01-04 LAB — DIFFERENTIAL
Basophils Absolute: 0 10*3/uL (ref 0.0–0.1)
Basophils Relative: 1 % (ref 0–1)
Neutro Abs: 2.1 10*3/uL (ref 1.7–7.7)
Neutrophils Relative %: 50 % (ref 43–77)

## 2011-01-04 LAB — COMPREHENSIVE METABOLIC PANEL
ALT: 29 U/L (ref 0–53)
Alkaline Phosphatase: 63 U/L (ref 39–117)
CO2: 24 mEq/L (ref 19–32)
GFR calc Af Amer: >60 mL/min (ref >60)
GFR calc non Af Amer: >60 mL/min (ref >60)
Glucose, Bld: 93 mg/dL (ref 70–99)
Potassium: 4 mEq/L (ref 3.5–5.1)
Sodium: 137 mEq/L (ref 135–145)
Total Protein: 6.4 g/dL (ref 6.0–8.3)

## 2011-01-04 LAB — CBC
Hemoglobin: 13.8 g/dL (ref 13.0–17.0)
RBC: 4.67 MIL/uL (ref 4.22–5.81)
WBC: 4.1 10*3/uL (ref 4.0–10.5)

## 2011-01-06 LAB — CBC
HCT: 41.1 % (ref 39.0–52.0)
MCH: 29.8 pg (ref 26.0–34.0)
MCHC: 33.8 g/dL (ref 30.0–36.0)
RDW: 15.8 % — ABNORMAL HIGH (ref 11.5–15.5)

## 2011-01-06 LAB — MAGNESIUM: Magnesium: 1.9 mg/dL (ref 1.5–2.5)

## 2011-01-06 LAB — DIFFERENTIAL
Basophils Absolute: 0 10*3/uL (ref 0.0–0.1)
Eosinophils Relative: 4 % (ref 0–5)
Lymphocytes Relative: 44 % (ref 12–46)
Monocytes Absolute: 0.3 10*3/uL (ref 0.1–1.0)

## 2011-01-06 LAB — COMPREHENSIVE METABOLIC PANEL
Albumin: 3.2 g/dL — ABNORMAL LOW (ref 3.5–5.2)
BUN: 3 mg/dL — ABNORMAL LOW (ref 6–23)
Calcium: 9.2 mg/dL (ref 8.4–10.5)
Creatinine, Ser: 0.65 mg/dL (ref 0.50–1.35)
Potassium: 3.9 mEq/L (ref 3.5–5.1)
Total Protein: 6.6 g/dL (ref 6.0–8.3)

## 2011-01-07 DIAGNOSIS — F0630 Mood disorder due to known physiological condition, unspecified: Secondary | ICD-10-CM
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
## 2011-01-19 NOTE — Discharge Summary (Signed)
  [name], [name]                 ACCOUNT NO.:  [id number]  MEDICAL RECORD NO.:  [id number]  LOCATION:  1520                         FACILITY:  [facility]  PHYSICIAN:  Erick Blinks, MD     DATE OF BIRTH:  1981/04/23  DATE OF ADMISSION:  12/31/2010 DATE OF DISCHARGE:                        DISCHARGE SUMMARY - REFERRING   ADDENDUM:  DISCHARGE DIAGNOSES: 1. Alcohol-induced acute pancreatitis, improving. 2. Alcohol intoxication/chronic alcoholism. 3. Suicide attempts. 4. Bipolar affective disorder. 5. Hypertension. 6. Hyperlipidemia. 7. Seizure disorder. 8. Peripheral neuropathy. 9. 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  DISCHARGE MEDICATIONS: 1. Multivitamins 1 tablet p.o. daily. 2. Metoprolol 50 mg p.o. b.i.d. 3. Phenytoin extended release 200 mg p.o. b.i.d. 4. Crestor 40 mg p.o. daily. 5. Trazodone 100 mg 3 tablets p.o. q.h.s. p.r.n. 6. Buspirone 5 mg 1/2 tablet p.o. b.i.d. 7. Citalopram 20 mg 1 tablet p.o. daily. 8. Abilify 10 mg p.o. daily. 9. Gabapentin 300 mg p.o. b.i.d. 10.Norvasc 5 mg p.o. daily.  For admission history and hospital course, please refer to the discharge summary per Dr. Beverly Gust on June 17th.  HOSPITAL COURSE: 1. In brief, the patient's hospital course has been uneventful.     Regarding his pancreatitis, he is currently tolerating the solid     diet.  We will maintain a low-fat diet and the patient may be given     antiemetics as needed.  Currently, he is tolerating a solid diet.     He still does have some pain, which can be treated with oral     narcotics if necessary. 2. Hypertension.  The patient is on metoprolol b.i.d.  He was also     started on clonidine.  This will be discontinued due to risk of     rebound hypertension.  We will start the patient on Norvasc and     this can be further adjusted as an outpatient. 3. Suicidal ideations with history of substance abuse.  The patient     will be transferred to alcohol  and drug rehab center.  We hope we     will do this tomorrow.  The patient is otherwise stable for     discharge.  He is to continue on     a low-fat diet.  Follow up with his physician care physician once     he is discharged from the rehab facility.  Activity should be     conducted as tolerated.  The patient has been placed on involuntary     commitment and currently while waiting transfer to alcohol and drug     rehab facility in [place].     Erick Blinks, MD     JM/MEDQ  D:  01/06/2011  T:  01/06/2011  Job:  [id number]  Electronically Signed by Durward Mallard Jazlyne Gauger  on 01/19/2011 05:36:03 PM

## 2011-02-22 ENCOUNTER — Emergency Department (HOSPITAL_COMMUNITY)
Admission: EM | Admit: 2011-02-22 | Discharge: 2011-02-24 | Disposition: A | Discharge status: Home / Self Care | Payer: Self-pay | Attending: Emergency Medicine | Admitting: Emergency Medicine

## 2011-02-22 DIAGNOSIS — E7800 Pure hypercholesterolemia, unspecified: Secondary | ICD-10-CM | POA: Insufficient documentation

## 2011-02-22 DIAGNOSIS — F1010 Alcohol abuse, uncomplicated: Secondary | ICD-10-CM | POA: Insufficient documentation

## 2011-02-22 DIAGNOSIS — I10 Essential (primary) hypertension: Secondary | ICD-10-CM | POA: Insufficient documentation

## 2011-02-22 DIAGNOSIS — R45851 Suicidal ideations: Secondary | ICD-10-CM | POA: Insufficient documentation

## 2011-02-22 LAB — DIFFERENTIAL
Basophils Absolute: 0 10*3/uL (ref 0.0–0.1)
Lymphocytes Relative: 57 % — ABNORMAL HIGH (ref 12–46)
Lymphs Abs: 2.2 10*3/uL (ref 0.7–4.0)
Neutro Abs: 1.3 10*3/uL — ABNORMAL LOW (ref 1.7–7.7)
Neutrophils Relative %: 32 % — ABNORMAL LOW (ref 43–77)

## 2011-02-22 LAB — URINALYSIS, ROUTINE W REFLEX MICROSCOPIC
Bilirubin Urine: NEGATIVE
Hgb urine dipstick: NEGATIVE
Nitrite: NEGATIVE
Specific Gravity, Urine: 1.038 — ABNORMAL HIGH (ref 1.005–1.030)
pH: 6 (ref 5.0–8.0)

## 2011-02-22 LAB — COMPREHENSIVE METABOLIC PANEL
ALT: 31 U/L (ref 0–53)
AST: 39 U/L — ABNORMAL HIGH (ref 0–37)
Albumin: 3.9 g/dL (ref 3.5–5.2)
CO2: 28 mEq/L (ref 19–32)
Calcium: 9.1 mg/dL (ref 8.4–10.5)
GFR calc non Af Amer: >60 mL/min (ref >60)
Sodium: 141 mEq/L (ref 135–145)

## 2011-02-22 LAB — RAPID URINE DRUG SCREEN, HOSP PERFORMED
Barbiturates: NOT DETECTED
Benzodiazepines: NOT DETECTED
Cocaine: NOT DETECTED

## 2011-02-22 LAB — CBC
HCT: 43.6 % (ref 39.0–52.0)
MCV: 85.2 fL (ref 78.0–100.0)
RBC: 5.12 MIL/uL (ref 4.22–5.81)
WBC: 3.9 10*3/uL — ABNORMAL LOW (ref 4.0–10.5)

## 2011-02-22 LAB — URINE MICROSCOPIC-ADD ON

## 2011-04-14 LAB — ACETAMINOPHEN LEVEL: Acetaminophen (Tylenol), Serum: <10 — ABNORMAL LOW

## 2011-04-14 LAB — URINALYSIS, ROUTINE W REFLEX MICROSCOPIC
Hgb urine dipstick: NEGATIVE
Ketones, ur: 15 — AB
Nitrite: POSITIVE — AB
Urobilinogen, UA: 1
pH: 6

## 2011-04-14 LAB — CBC
Hemoglobin: 15.5
MCHC: 35.3
RBC: 4.92
WBC: 5.3

## 2011-04-14 LAB — COMPREHENSIVE METABOLIC PANEL
ALT: 202 — ABNORMAL HIGH
Alkaline Phosphatase: 42
CO2: 23
Chloride: 92 — ABNORMAL LOW
GFR calc non Af Amer: >60
Glucose, Bld: 107 — ABNORMAL HIGH
Potassium: 3.4 — ABNORMAL LOW
Sodium: 132 — ABNORMAL LOW

## 2011-04-14 LAB — DIFFERENTIAL
Basophils Relative: 0
Eosinophils Absolute: 0
Eosinophils Relative: 1
Neutrophils Relative %: 64

## 2011-04-14 LAB — TRICYCLICS SCREEN, URINE: TCA Scrn: NOT DETECTED

## 2011-04-14 LAB — RAPID URINE DRUG SCREEN, HOSP PERFORMED
Amphetamines: NOT DETECTED
Barbiturates: NOT DETECTED
Benzodiazepines: NOT DETECTED

## 2011-04-14 LAB — URINE CULTURE: Colony Count: 7000

## 2011-04-14 LAB — ETHANOL: Alcohol, Ethyl (B): <5

## 2011-04-14 LAB — URINE MICROSCOPIC-ADD ON

## 2011-04-14 LAB — HIV ANTIBODY (ROUTINE TESTING W REFLEX): HIV: NONREACTIVE

## 2011-04-14 LAB — WOUND CULTURE: Gram Stain: NONE SEEN

## 2011-04-14 LAB — LIPASE, BLOOD: Lipase: 15

## 2011-04-14 LAB — SALICYLATE LEVEL: Salicylate Lvl: <4

## 2011-04-17 IMAGING — CT CT HEAD W/O CM
1 of 2 series · 16 of 30 positions shown, 20 images · non-contrast
Comparison: 07/24/2008.

CLINICAL DATA: Alcohol withdrawal.  Hallucinations.

CT HEAD WITHOUT CONTRAST
TECHNIQUE: Contiguous axial images were obtained from the base of
the skull through the vertex without contrast.

[Series 2: head_seq 4.5 h37s st · axial · 0.43mm/px · z∈[-115,+11]mm · 16 of 32 slices shown, 20 images]
[im 2/32  brain]
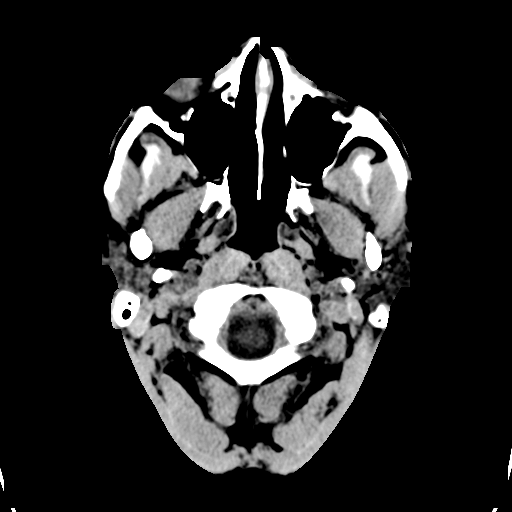
[im 2/32  bone]
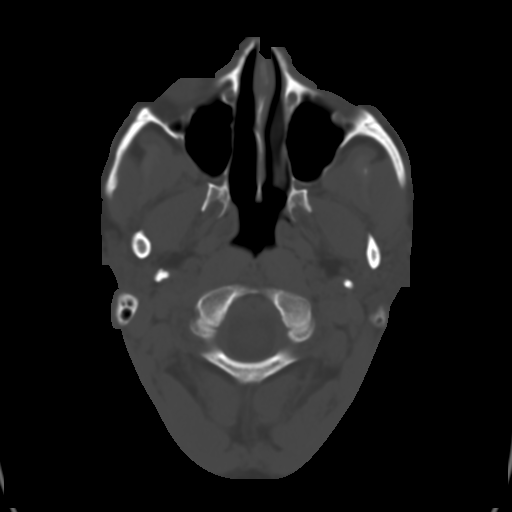
[im 4/32  brain]
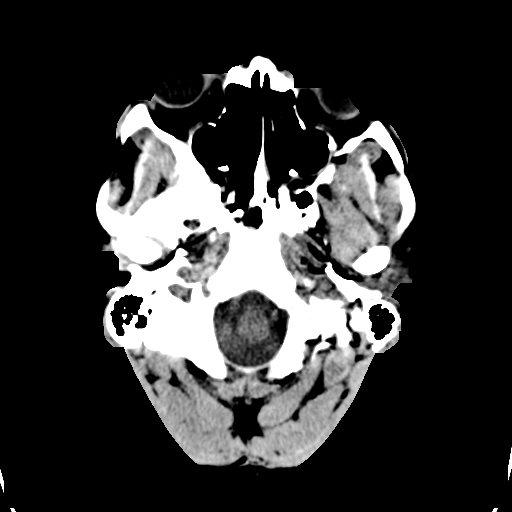
[im 5/32  brain]
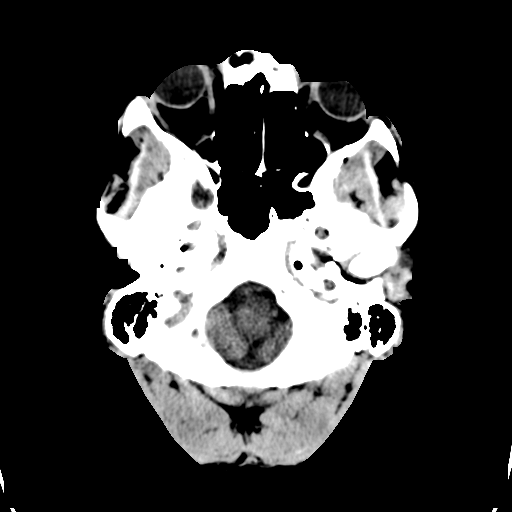
[im 8/32  brain]
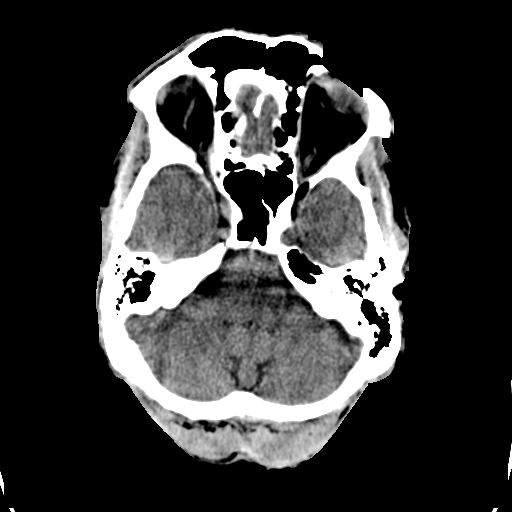
[im 10/32  brain]
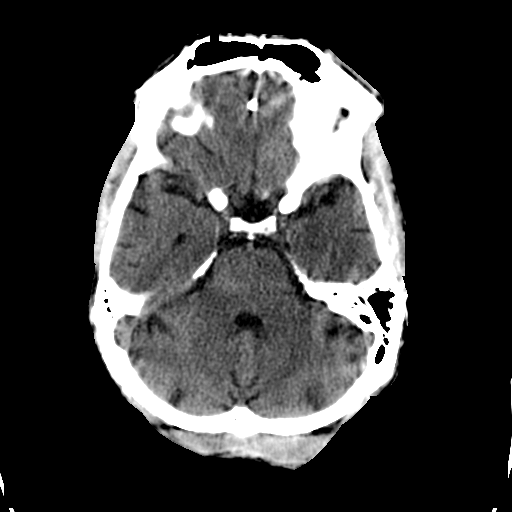
[im 10/32  bone]
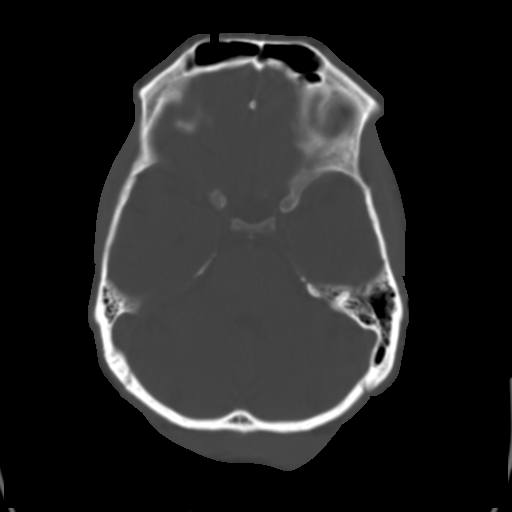
[im 11/32  brain]
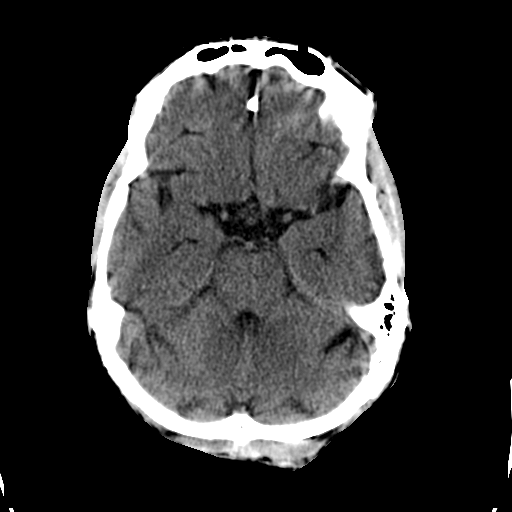
[im 14/32  brain]
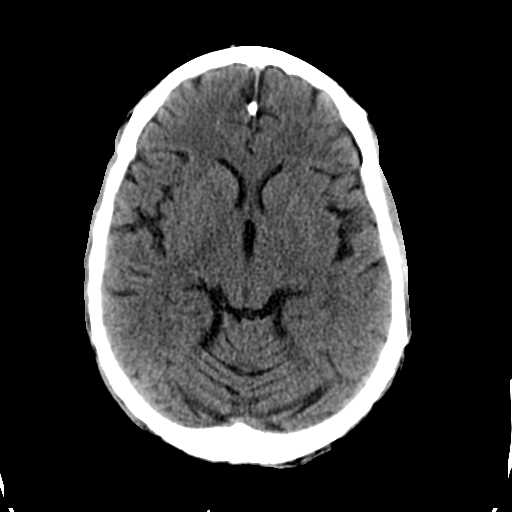
[im 15/32  brain]
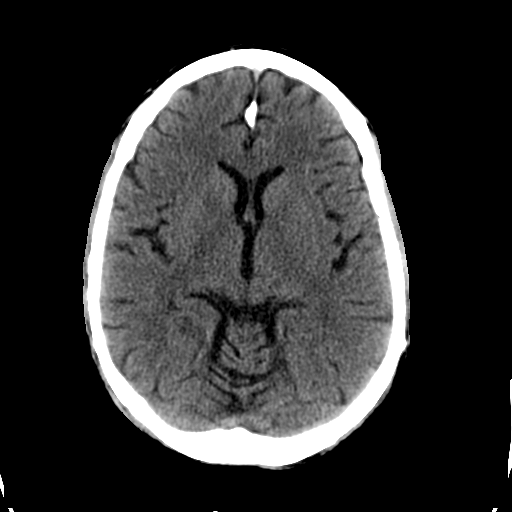
[im 17/32  brain]
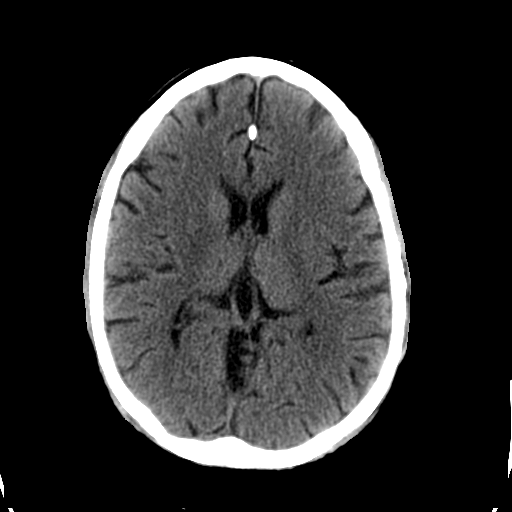
[im 17/32  bone]
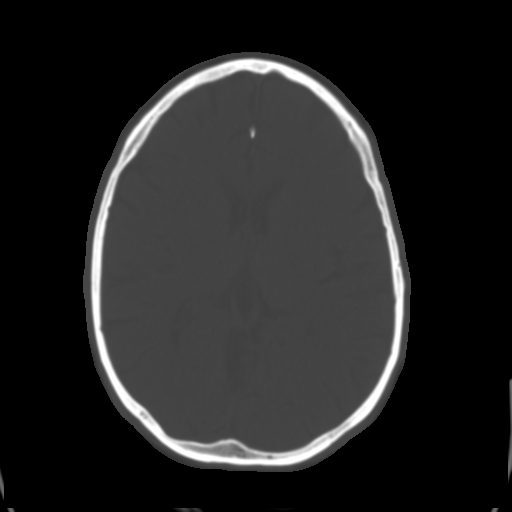
[im 18/32  brain]
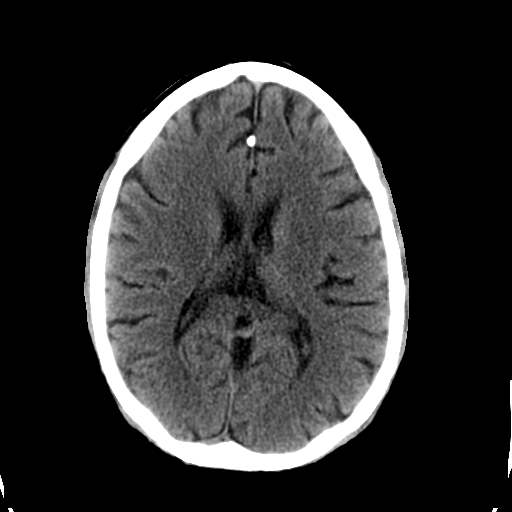
[im 21/32  brain]
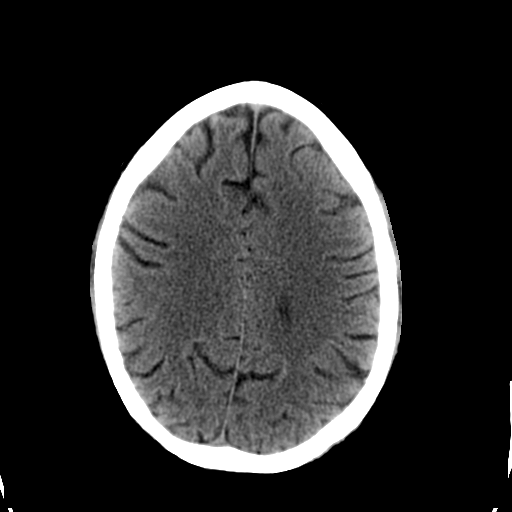
[im 22/32  brain]
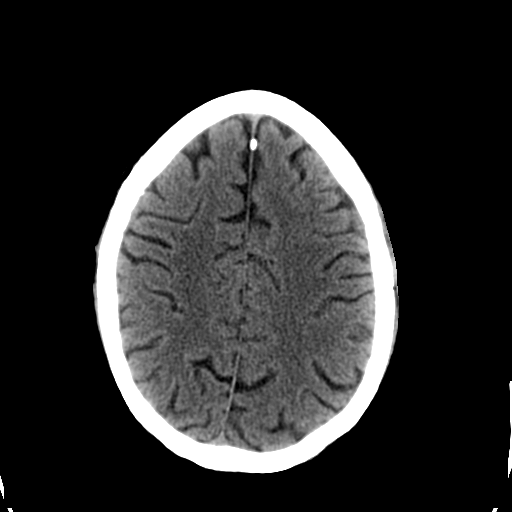
[im 24/32  brain]
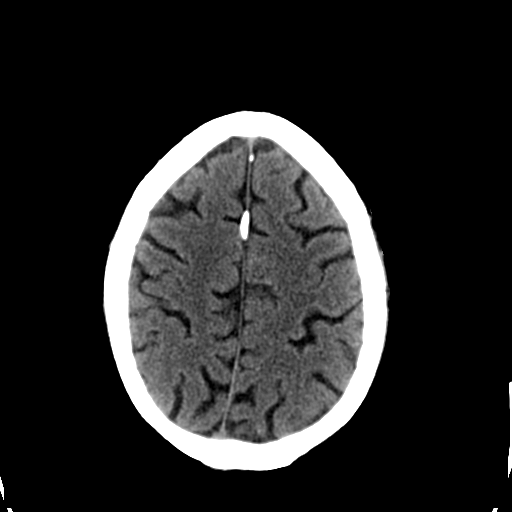
[im 24/32  bone]
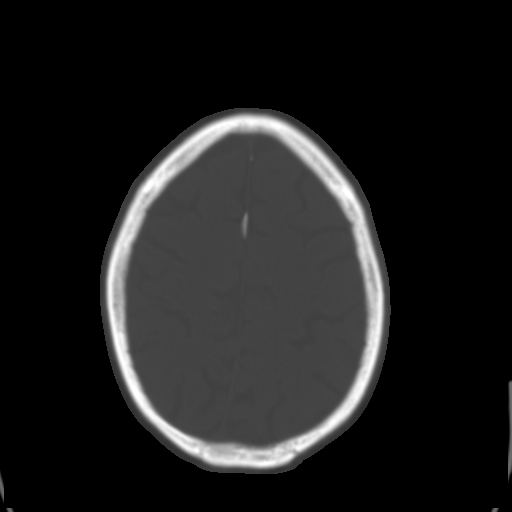
[im 27/32  brain]
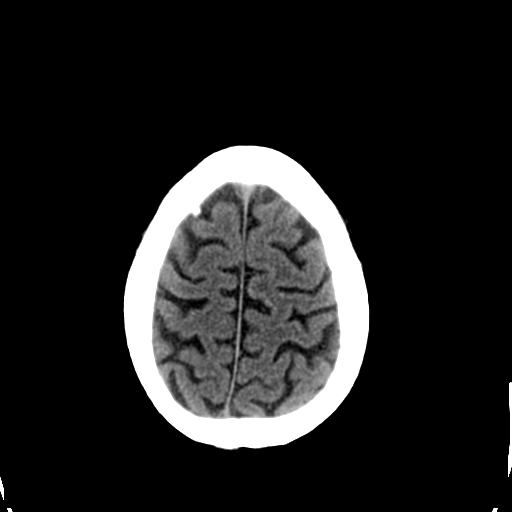
[im 28/32  brain]
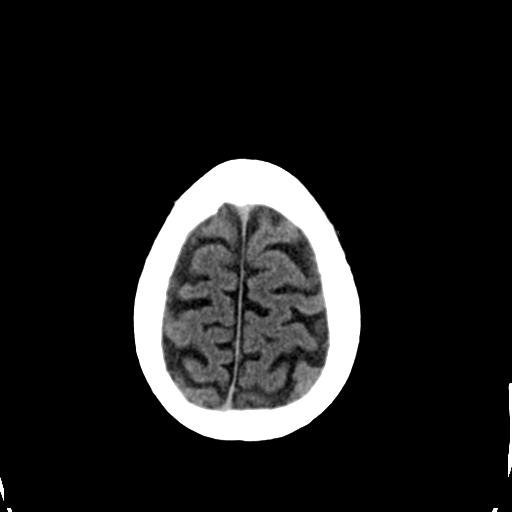
[im 30/32  brain]
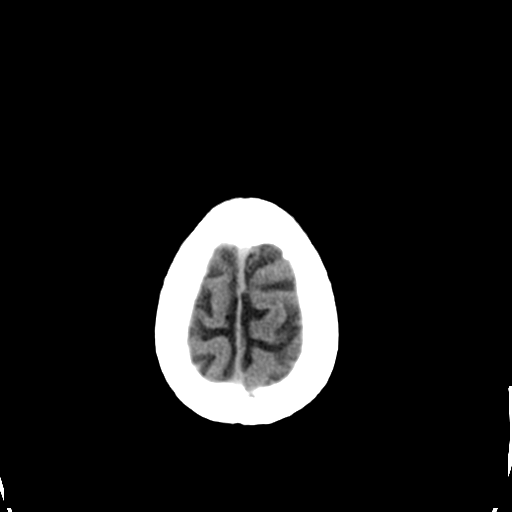

[16 of 30 positions shown; findings below may reference images not displayed]

FINDINGS: Moderate global atrophy is redemonstrated.  No acute
intracranial abnormality is present.  Specifically, there is no
evidence for acute infarct, hemorrhage, mass, hydrocephalus, or
extra-axial fluid collection.  The paranasal sinuses and mastoid
air cells are clear.  The osseous skull is intact.
IMPRESSION: 1.  No acute intracranial abnormality.
2.  Stable appearance of diffuse global atrophy.

## 2011-04-19 LAB — DIFFERENTIAL
Basophils Relative: 0
Eosinophils Absolute: 0
Eosinophils Relative: 0
Lymphs Abs: 1.1
Monocytes Absolute: 0.5
Monocytes Relative: 6
Neutrophils Relative %: 80 — ABNORMAL HIGH

## 2011-04-19 LAB — URINALYSIS, ROUTINE W REFLEX MICROSCOPIC
Glucose, UA: NEGATIVE
Hgb urine dipstick: NEGATIVE
Hgb urine dipstick: NEGATIVE
Ketones, ur: 40 — AB
Ketones, ur: NEGATIVE
Nitrite: NEGATIVE
Protein, ur: NEGATIVE
Specific Gravity, Urine: 1.037 — ABNORMAL HIGH
Urobilinogen, UA: 1
pH: 6.5

## 2011-04-19 LAB — RAPID URINE DRUG SCREEN, HOSP PERFORMED
Barbiturates: NOT DETECTED
Benzodiazepines: NOT DETECTED
Opiates: NOT DETECTED

## 2011-04-19 LAB — CBC
HCT: 48.9
Hemoglobin: 16.8
MCHC: 33.8
MCHC: 34.3
MCV: 94
Platelets: 134 — ABNORMAL LOW
RBC: 4.21 — ABNORMAL LOW
RBC: 4.23
RBC: 5.2
RDW: 14.7
WBC: 6.7
WBC: 8.2

## 2011-04-19 LAB — LIPID PANEL
Cholesterol: 313 — ABNORMAL HIGH
HDL: 80
Total CHOL/HDL Ratio: 3.9

## 2011-04-19 LAB — BASIC METABOLIC PANEL
BUN: 5 — ABNORMAL LOW
BUN: 5 — ABNORMAL LOW
CO2: 22
Calcium: 8.6
Creatinine, Ser: 0.9
GFR calc Af Amer: >60
GFR calc non Af Amer: >60
Glucose, Bld: 92
Potassium: 3.4 — ABNORMAL LOW
Sodium: 140

## 2011-04-19 LAB — TSH: TSH: 1.279

## 2011-04-19 LAB — COMPREHENSIVE METABOLIC PANEL
AST: 30
Albumin: 2.8 — ABNORMAL LOW
Alkaline Phosphatase: 36 — ABNORMAL LOW
Chloride: 106
GFR calc Af Amer: >60
Potassium: 3.4 — ABNORMAL LOW
Sodium: 140
Total Bilirubin: 0.8
Total Protein: 5.6 — ABNORMAL LOW

## 2011-04-19 LAB — POCT I-STAT, CHEM 8
Calcium, Ion: 0.96 — ABNORMAL LOW
Creatinine, Ser: 1.1
Glucose, Bld: 92
HCT: 53 — ABNORMAL HIGH
Hemoglobin: 18 — ABNORMAL HIGH
Potassium: 3.1 — ABNORMAL LOW

## 2011-04-19 LAB — AMMONIA: Ammonia: 126 — ABNORMAL HIGH

## 2011-04-19 LAB — CARDIAC PANEL(CRET KIN+CKTOT+MB+TROPI)
Total CK: 1763 — ABNORMAL HIGH
Total CK: 1766 — ABNORMAL HIGH
Troponin I: 0.02

## 2011-04-19 LAB — CK TOTAL AND CKMB (NOT AT ARMC): Total CK: 2433 — ABNORMAL HIGH

## 2011-04-19 LAB — CK: Total CK: 384 — ABNORMAL HIGH

## 2011-04-19 LAB — MAGNESIUM: Magnesium: 1.4 — ABNORMAL LOW

## 2011-04-19 LAB — PHOSPHORUS: Phosphorus: 2.4

## 2011-04-19 LAB — URINE MICROSCOPIC-ADD ON

## 2011-04-19 LAB — VITAMIN B12: Vitamin B-12: 464 (ref 211–911)

## 2011-04-19 LAB — TROPONIN I: Troponin I: 0.02

## 2011-04-20 LAB — DIFFERENTIAL
Basophils Absolute: 0
Basophils Relative: 0
Eosinophils Absolute: 0.1
Monocytes Relative: 7
Neutro Abs: 3.4
Neutrophils Relative %: 64

## 2011-04-20 LAB — RAPID URINE DRUG SCREEN, HOSP PERFORMED
Amphetamines: NOT DETECTED
Barbiturates: NOT DETECTED
Benzodiazepines: POSITIVE — AB
Cocaine: NOT DETECTED

## 2011-04-20 LAB — BASIC METABOLIC PANEL
BUN: 9
CO2: 26
Calcium: 10
Creatinine, Ser: 1.01
GFR calc Af Amer: >60

## 2011-04-20 LAB — CBC
MCHC: 33.5
MCV: 95.7
Platelets: 124 — ABNORMAL LOW
RBC: 4.8

## 2011-04-20 LAB — ETHANOL: Alcohol, Ethyl (B): <5

## 2011-04-28 LAB — COMPREHENSIVE METABOLIC PANEL
ALT: 144 — ABNORMAL HIGH
AST: 99 — ABNORMAL HIGH
Albumin: 2.8 — ABNORMAL LOW
Alkaline Phosphatase: 43
Alkaline Phosphatase: 44
BUN: 2 — ABNORMAL LOW
CO2: 26
Calcium: 8.2 — ABNORMAL LOW
Chloride: 102
GFR calc Af Amer: >60
GFR calc Af Amer: >60
GFR calc non Af Amer: >60
Potassium: 3.2 — ABNORMAL LOW
Potassium: 3.9
Sodium: 133 — ABNORMAL LOW
Total Bilirubin: 1.2
Total Protein: 5.6 — ABNORMAL LOW

## 2011-04-28 LAB — CBC
HCT: 39.5
HCT: 40.6
HCT: 41.2
Hemoglobin: 13.8
Hemoglobin: 14.1
MCV: 89
MCV: 89.5
Platelets: 147 — ABNORMAL LOW
Platelets: 233
Platelets: 317
RBC: 4.49
RBC: 4.53
RDW: 12.9
RDW: 13.4
RDW: 14
WBC: 5.3
WBC: 7.1
WBC: 7.3
WBC: 9.1

## 2011-04-28 LAB — BASIC METABOLIC PANEL
BUN: 3 — ABNORMAL LOW
BUN: 3 — ABNORMAL LOW
Chloride: 100
Chloride: 104
Creatinine, Ser: 1.04
GFR calc non Af Amer: >60
Glucose, Bld: 98
Potassium: 3.9
Potassium: 4.1
Sodium: 137

## 2011-04-28 LAB — CULTURE, BLOOD (ROUTINE X 2)

## 2011-04-28 LAB — HIV ANTIBODY (ROUTINE TESTING W REFLEX): HIV: NONREACTIVE

## 2011-04-28 LAB — URINE CULTURE: Special Requests: POSITIVE

## 2011-04-29 LAB — CBC
HCT: 41
Hemoglobin: 16.1
MCHC: 34.8
MCV: 88.7
Platelets: 201
RBC: 4.63
RDW: 14.1 — ABNORMAL HIGH
WBC: 7.3

## 2011-04-29 LAB — DIFFERENTIAL
Basophils Relative: 0
Eosinophils Absolute: 0
Lymphs Abs: 1.6
Monocytes Absolute: 0.5
Monocytes Relative: 8

## 2011-04-29 LAB — COMPREHENSIVE METABOLIC PANEL
ALT: 253 — ABNORMAL HIGH
AST: 147 — ABNORMAL HIGH
AST: 206 — ABNORMAL HIGH
Albumin: 4.4
Alkaline Phosphatase: 59
BUN: 10
CO2: 24
Calcium: 8.6
Chloride: 106
Creatinine, Ser: 1.28
GFR calc Af Amer: >60
GFR calc Af Amer: >60
GFR calc non Af Amer: >60
Glucose, Bld: 126 — ABNORMAL HIGH
Potassium: 3.5
Sodium: 135
Total Bilirubin: 1.7 — ABNORMAL HIGH
Total Protein: 8.1

## 2011-04-29 LAB — URINALYSIS, ROUTINE W REFLEX MICROSCOPIC
Glucose, UA: NEGATIVE
Hgb urine dipstick: NEGATIVE
Ketones, ur: 40 — AB
Protein, ur: 100 — AB
pH: 6.5

## 2011-04-29 LAB — RAPID URINE DRUG SCREEN, HOSP PERFORMED
Barbiturates: NOT DETECTED
Benzodiazepines: NOT DETECTED
Cocaine: POSITIVE — AB

## 2011-04-29 LAB — ETHANOL: Alcohol, Ethyl (B): <5

## 2011-04-29 LAB — URINE MICROSCOPIC-ADD ON

## 2011-04-29 LAB — PROTIME-INR
INR: 1
Prothrombin Time: 13.6

## 2011-06-18 ENCOUNTER — Encounter (INDEPENDENT_AMBULATORY_CARE_PROVIDER_SITE_OTHER): Payer: Self-pay | Admitting: *Deleted

## 2011-06-18 DIAGNOSIS — Z48812 Encounter for surgical aftercare following surgery on the circulatory system: Secondary | ICD-10-CM

## 2011-06-18 DIAGNOSIS — I739 Peripheral vascular disease, unspecified: Secondary | ICD-10-CM

## 2011-06-30 ENCOUNTER — Other Ambulatory Visit: Payer: Self-pay

## 2011-06-30 DIAGNOSIS — I739 Peripheral vascular disease, unspecified: Secondary | ICD-10-CM

## 2011-06-30 DIAGNOSIS — Z48812 Encounter for surgical aftercare following surgery on the circulatory system: Secondary | ICD-10-CM

## 2011-06-30 NOTE — Procedures (Unsigned)
BYPASS GRAFT EVALUATION  INDICATION:  Followup left bypass graft  HISTORY: Diabetes:  No Cardiac:  No Hypertension:  No Smoking:  Previous Previous Surgery:  Left above knee to below knee bypass graft with reversed saphenous vein 11/04/2009 by Dr. Arbie Cookey  SINGLE LEVEL ARTERIAL EXAM                              RIGHT              LEFT Brachial:                    133                132 Anterior tibial:             138                147 Posterior tibial:            122                155 Peroneal: Ankle/brachial index:        1.04               1.17  PREVIOUS ABI:  Date:  12/07/2010  RIGHT:  1.10  LEFT:  1.09  LOWER EXTREMITY BYPASS GRAFT DUPLEX EXAM:  DUPLEX:  Patent left bypass graft with triphasic waveforms noted throughout.  IMPRESSION: 1. Bilateral ankle brachial indices are within normal limits and     stable in comparison to the last exam. 2. Patent left above knee to below knee bypass graft with velocity     measurements shown on the following worksheet.   __________________________________________ Larina Earthly, M.D.  EM/MEDQ  D:  06/21/2011  T:  06/21/2011  Job:  [id number]

## 2011-07-01 ENCOUNTER — Encounter: Payer: Self-pay | Admitting: Vascular Surgery

## 2011-07-14 ENCOUNTER — Encounter: Payer: Self-pay | Admitting: Emergency Medicine

## 2011-07-14 ENCOUNTER — Inpatient Hospital Stay (HOSPITAL_COMMUNITY)
Admission: EM | Admit: 2011-07-14 | Discharge: 2011-07-16 | DRG: 101 | Disposition: A | Discharge status: Home / Self Care | Payer: Self-pay | Attending: Family Medicine | Admitting: Family Medicine

## 2011-07-14 ENCOUNTER — Emergency Department (HOSPITAL_COMMUNITY)
Admission: EM | Admit: 2011-07-14 | Discharge: 2011-07-14 | Disposition: A | Discharge status: Home / Self Care | Payer: Self-pay | Attending: Emergency Medicine | Admitting: Emergency Medicine

## 2011-07-14 ENCOUNTER — Encounter (HOSPITAL_COMMUNITY): Payer: Self-pay

## 2011-07-14 DIAGNOSIS — F319 Bipolar disorder, unspecified: Secondary | ICD-10-CM | POA: Diagnosis present

## 2011-07-14 DIAGNOSIS — K2970 Gastritis, unspecified, without bleeding: Secondary | ICD-10-CM | POA: Insufficient documentation

## 2011-07-14 DIAGNOSIS — E785 Hyperlipidemia, unspecified: Secondary | ICD-10-CM

## 2011-07-14 DIAGNOSIS — K2990 Gastroduodenitis, unspecified, without bleeding: Secondary | ICD-10-CM | POA: Insufficient documentation

## 2011-07-14 DIAGNOSIS — G40909 Epilepsy, unspecified, not intractable, without status epilepticus: Principal | ICD-10-CM | POA: Diagnosis present

## 2011-07-14 DIAGNOSIS — I10 Essential (primary) hypertension: Secondary | ICD-10-CM | POA: Diagnosis present

## 2011-07-14 DIAGNOSIS — F1910 Other psychoactive substance abuse, uncomplicated: Secondary | ICD-10-CM | POA: Diagnosis present

## 2011-07-14 DIAGNOSIS — F1010 Alcohol abuse, uncomplicated: Secondary | ICD-10-CM | POA: Insufficient documentation

## 2011-07-14 DIAGNOSIS — K2900 Acute gastritis without bleeding: Secondary | ICD-10-CM | POA: Diagnosis present

## 2011-07-14 DIAGNOSIS — R569 Unspecified convulsions: Secondary | ICD-10-CM

## 2011-07-14 DIAGNOSIS — Z9119 Patient's noncompliance with other medical treatment and regimen: Secondary | ICD-10-CM

## 2011-07-14 DIAGNOSIS — R109 Unspecified abdominal pain: Secondary | ICD-10-CM | POA: Insufficient documentation

## 2011-07-14 DIAGNOSIS — Z91199 Patient's noncompliance with other medical treatment and regimen due to unspecified reason: Secondary | ICD-10-CM

## 2011-07-14 DIAGNOSIS — R112 Nausea with vomiting, unspecified: Secondary | ICD-10-CM | POA: Diagnosis present

## 2011-07-14 HISTORY — DX: Depression, unspecified: F32.A

## 2011-07-14 HISTORY — DX: Major depressive disorder, single episode, unspecified: F32.9

## 2011-07-14 HISTORY — DX: Unspecified convulsions: R56.9

## 2011-07-14 HISTORY — DX: Essential (primary) hypertension: I10

## 2011-07-14 LAB — LIPASE, BLOOD: Lipase: 31 U/L (ref 11–59)

## 2011-07-14 LAB — CBC
HCT: 46.5 % (ref 39.0–52.0)
MCH: 30.4 pg (ref 26.0–34.0)
MCHC: 36.3 g/dL — ABNORMAL HIGH (ref 30.0–36.0)
RDW: 14 % (ref 11.5–15.5)

## 2011-07-14 LAB — COMPREHENSIVE METABOLIC PANEL
Albumin: 4.2 g/dL (ref 3.5–5.2)
Alkaline Phosphatase: 64 U/L (ref 39–117)
BUN: 13 mg/dL (ref 6–23)
Calcium: 9.6 mg/dL (ref 8.4–10.5)
Potassium: 3.3 mEq/L — ABNORMAL LOW (ref 3.5–5.1)
Sodium: 139 mEq/L (ref 135–145)
Total Protein: 8.1 g/dL (ref 6.0–8.3)

## 2011-07-14 LAB — ETHANOL: Alcohol, Ethyl (B): 84 mg/dL — ABNORMAL HIGH (ref 0–11)

## 2011-07-14 MED ORDER — SODIUM CHLORIDE 0.9 % IJ SOLN
3.0000 mL | Freq: Two times a day (BID) | INTRAMUSCULAR | Status: DC
  Administered 2011-07-14: 3 mL via INTRAVENOUS

## 2011-07-14 MED ORDER — PANTOPRAZOLE SODIUM 40 MG PO TBEC
40.0000 mg | DELAYED_RELEASE_TABLET | Freq: Every day | ORAL | Status: DC
  Administered 2011-07-14: 40 mg via ORAL
  Filled 2011-07-14 (×2): qty 1

## 2011-07-14 MED ORDER — GI COCKTAIL ~~LOC~~
30.0000 mL | Freq: Once | ORAL | Status: AC
  Administered 2011-07-14: 30 mL via ORAL
  Filled 2011-07-14: qty 30

## 2011-07-14 MED ORDER — ACETAMINOPHEN 650 MG RE SUPP: 650.0000 mg | Freq: Four times a day (QID) | RECTAL | Status: DC | PRN

## 2011-07-14 MED ORDER — THIAMINE HCL 100 MG/ML IJ SOLN
100.0000 mg | Freq: Every day | INTRAMUSCULAR | Status: DC
  Filled 2011-07-14: qty 2

## 2011-07-14 MED ORDER — HYDROMORPHONE HCL PF 1 MG/ML IJ SOLN
1.0000 mg | Freq: Once | INTRAMUSCULAR | Status: AC
  Administered 2011-07-14: 1 mg via INTRAMUSCULAR
  Filled 2011-07-14: qty 1

## 2011-07-14 MED ORDER — HYDROCODONE-ACETAMINOPHEN 5-500 MG PO TABS: 1.0000 | ORAL_TABLET | Freq: Four times a day (QID) | ORAL | Status: AC | PRN

## 2011-07-14 MED ORDER — FOLIC ACID 1 MG PO TABS
1.0000 mg | ORAL_TABLET | Freq: Every day | ORAL | Status: DC
  Administered 2011-07-14: 1 mg via ORAL
  Filled 2011-07-14 (×2): qty 1

## 2011-07-14 MED ORDER — ALUM & MAG HYDROXIDE-SIMETH 200-200-20 MG/5ML PO SUSP: 30.0000 mL | Freq: Four times a day (QID) | ORAL | Status: DC | PRN

## 2011-07-14 MED ORDER — SODIUM CHLORIDE 0.9 % IV SOLN: 20.0000 mg/kg | Freq: Once | INTRAVENOUS | Status: DC

## 2011-07-14 MED ORDER — ADULT MULTIVITAMIN W/MINERALS CH
1.0000 | ORAL_TABLET | Freq: Every day | ORAL | Status: DC
  Administered 2011-07-14 – 2011-07-16 (×3): 1 via ORAL
  Filled 2011-07-14 (×3): qty 1

## 2011-07-14 MED ORDER — VITAMIN B-1 100 MG PO TABS
100.0000 mg | ORAL_TABLET | Freq: Every day | ORAL | Status: DC
  Administered 2011-07-14: 100 mg via ORAL
  Filled 2011-07-14 (×2): qty 1

## 2011-07-14 MED ORDER — ONDANSETRON HCL 4 MG/2ML IJ SOLN
4.0000 mg | Freq: Once | INTRAMUSCULAR | Status: AC
  Administered 2011-07-14: 4 mg via INTRAVENOUS
  Filled 2011-07-14: qty 2

## 2011-07-14 MED ORDER — FAMOTIDINE 20 MG PO TABS
20.0000 mg | ORAL_TABLET | Freq: Once | ORAL | Status: AC
  Administered 2011-07-14: 20 mg via ORAL
  Filled 2011-07-14: qty 1

## 2011-07-14 MED ORDER — LORAZEPAM 1 MG PO TABS
1.0000 mg | ORAL_TABLET | Freq: Once | ORAL | Status: AC
  Administered 2011-07-14: 1 mg via ORAL
  Filled 2011-07-14: qty 1

## 2011-07-14 MED ORDER — METOPROLOL SUCCINATE ER 50 MG PO TB24
50.0000 mg | ORAL_TABLET | Freq: Every day | ORAL | Status: DC
  Administered 2011-07-14 – 2011-07-16 (×3): 50 mg via ORAL
  Filled 2011-07-14 (×3): qty 1

## 2011-07-14 MED ORDER — LORAZEPAM 2 MG/ML IJ SOLN
1.0000 mg | Freq: Four times a day (QID) | INTRAMUSCULAR | Status: DC | PRN
  Administered 2011-07-14: 1 mg via INTRAVENOUS
  Filled 2011-07-14: qty 1

## 2011-07-14 MED ORDER — SODIUM CHLORIDE 0.9 % IV BOLUS (SEPSIS)
1000.0000 mL | Freq: Once | INTRAVENOUS | Status: AC
  Administered 2011-07-14: 1000 mL via INTRAVENOUS

## 2011-07-14 MED ORDER — PANTOPRAZOLE SODIUM 40 MG PO TBEC: 40.0000 mg | DELAYED_RELEASE_TABLET | Freq: Every day | ORAL | Status: DC

## 2011-07-14 MED ORDER — ONDANSETRON HCL 4 MG/2ML IJ SOLN
INTRAMUSCULAR | Status: AC
  Administered 2011-07-14: 09:00:00
  Filled 2011-07-14: qty 4

## 2011-07-14 MED ORDER — ENOXAPARIN SODIUM 40 MG/0.4ML ~~LOC~~ SOLN
40.0000 mg | SUBCUTANEOUS | Status: DC
  Administered 2011-07-14 – 2011-07-15 (×2): 40 mg via SUBCUTANEOUS
  Filled 2011-07-14 (×3): qty 0.4

## 2011-07-14 MED ORDER — AMLODIPINE BESYLATE 5 MG PO TABS
5.0000 mg | ORAL_TABLET | Freq: Every day | ORAL | Status: DC
  Administered 2011-07-14: 5 mg via ORAL
  Filled 2011-07-14 (×2): qty 1

## 2011-07-14 MED ORDER — INFLUENZA VIRUS VACC SPLIT PF IM SUSP
0.5000 mL | INTRAMUSCULAR | Status: AC
  Administered 2011-07-15: 0.5 mL via INTRAMUSCULAR
  Filled 2011-07-14: qty 0.5

## 2011-07-14 MED ORDER — ONDANSETRON HCL 8 MG PO TABS: 8.0000 mg | ORAL_TABLET | Freq: Three times a day (TID) | ORAL | Status: AC | PRN

## 2011-07-14 MED ORDER — HYDROMORPHONE HCL PF 1 MG/ML IJ SOLN
0.5000 mg | Freq: Once | INTRAMUSCULAR | Status: AC
  Administered 2011-07-14: 11:00:00 via INTRAVENOUS
  Filled 2011-07-14: qty 1

## 2011-07-14 MED ORDER — INFLUENZA VIRUS VACC SPLIT PF IM SUSP
0.5000 mL | INTRAMUSCULAR | Status: DC
  Filled 2011-07-14: qty 0.5

## 2011-07-14 MED ORDER — SODIUM CHLORIDE 0.9 % IV SOLN
120.0000 mg | Freq: Three times a day (TID) | INTRAVENOUS | Status: DC
  Administered 2011-07-15 – 2011-07-16 (×3): 120 mg via INTRAVENOUS
  Filled 2011-07-14 (×10): qty 2.4

## 2011-07-14 MED ORDER — SODIUM CHLORIDE 0.9 % IV SOLN: 250.0000 mL | INTRAVENOUS | Status: DC | PRN

## 2011-07-14 MED ORDER — HYDROCODONE-ACETAMINOPHEN 5-325 MG PO TABS
1.0000 | ORAL_TABLET | ORAL | Status: DC | PRN
  Administered 2011-07-14: 1 via ORAL
  Administered 2011-07-16: 2 via ORAL
  Filled 2011-07-14: qty 1
  Filled 2011-07-14: qty 2

## 2011-07-14 MED ORDER — PHENYTOIN SODIUM 50 MG/ML IJ SOLN
120.0000 mg | Freq: Three times a day (TID) | INTRAMUSCULAR | Status: DC
  Filled 2011-07-14 (×3): qty 2.4

## 2011-07-14 MED ORDER — SODIUM CHLORIDE 0.9 % IV SOLN
1500.0000 mg | INTRAVENOUS | Status: AC
  Administered 2011-07-14: 1500 mg via INTRAVENOUS
  Filled 2011-07-14: qty 30

## 2011-07-14 MED ORDER — PNEUMOCOCCAL VAC POLYVALENT 25 MCG/0.5ML IJ INJ
0.5000 mL | INJECTION | INTRAMUSCULAR | Status: AC
  Administered 2011-07-15: 0.5 mL via INTRAMUSCULAR
  Filled 2011-07-14: qty 0.5

## 2011-07-14 MED ORDER — ONDANSETRON HCL 4 MG/2ML IJ SOLN
4.0000 mg | Freq: Four times a day (QID) | INTRAMUSCULAR | Status: DC | PRN
  Administered 2011-07-15 (×2): 4 mg via INTRAVENOUS
  Filled 2011-07-14 (×2): qty 2

## 2011-07-14 MED ORDER — PANTOPRAZOLE SODIUM 40 MG IV SOLR
40.0000 mg | Freq: Once | INTRAVENOUS | Status: AC
  Administered 2011-07-14: 40 mg via INTRAVENOUS
  Filled 2011-07-14: qty 40

## 2011-07-14 MED ORDER — ONDANSETRON HCL 4 MG PO TABS: 4.0000 mg | ORAL_TABLET | Freq: Four times a day (QID) | ORAL | Status: DC | PRN

## 2011-07-14 MED ORDER — SODIUM CHLORIDE 0.9 % IJ SOLN: 3.0000 mL | INTRAMUSCULAR | Status: DC | PRN

## 2011-07-14 MED ORDER — SODIUM CHLORIDE 0.9 % IV SOLN
120.0000 mg | Freq: Once | INTRAVENOUS | Status: AC
  Administered 2011-07-15: 120 mg via INTRAVENOUS
  Filled 2011-07-14: qty 2.4

## 2011-07-14 MED ORDER — ROSUVASTATIN CALCIUM 40 MG PO TABS
40.0000 mg | ORAL_TABLET | Freq: Every day | ORAL | Status: DC
  Administered 2011-07-14: 40 mg via ORAL
  Filled 2011-07-14 (×2): qty 1

## 2011-07-14 MED ORDER — LORAZEPAM 1 MG PO TABS
1.0000 mg | ORAL_TABLET | Freq: Four times a day (QID) | ORAL | Status: DC | PRN
  Administered 2011-07-14: 1 mg via ORAL
  Filled 2011-07-14 (×3): qty 1

## 2011-07-14 MED ORDER — ACETAMINOPHEN 325 MG PO TABS: 650.0000 mg | ORAL_TABLET | Freq: Four times a day (QID) | ORAL | Status: DC | PRN

## 2011-07-14 NOTE — ED Notes (Signed)
md steinl notified that pt's states he fells like his head is spinning

## 2011-07-14 NOTE — ED Notes (Signed)
Pt threw up po ativan immediately after receiving it, so IV ativan was given.

## 2011-07-14 NOTE — ED Notes (Signed)
Was waiting for ride to go home after being d/c'd from hospital this am, stood up, passed out per patient. On arrival to rm, pt alert, orientedX3, vomited 500 cc greenish liquid. Marland Kitchen

## 2011-07-14 NOTE — ED Notes (Signed)
C/o dizziness while sitting.Dr Denton Lank aware

## 2011-07-14 NOTE — ED Provider Notes (Signed)
History     CSN: [id number]  Arrival date & time 07/14/11  1239   First MD Initiated Contact with Patient 07/14/11 1247      Chief Complaint  Patient presents with  . Near Syncope    (Consider location/radiation/quality/duration/timing/severity/associated sxs/prior treatment) The history is provided by the patient.  this is addendum to just completed ed note. Pt was just seen w nv, epigastric pain, etoh abuse, at d/c, pt w return nv, became faint/lightheaded. No syncope or seizure. abd soft nt. Emesis yellowish.   Past Medical History  Diagnosis Date  . Seizures   . Hypertension   . Depression     No past surgical history on file.  No family history on file.  History  Substance Use Topics  . Smoking status: Former Smoker -- 20 years  . Smokeless tobacco: Not on file  . Alcohol Use: Yes      Review of Systems Addendum to just completed ed note, same day Allergies  Morphine and Penicillins  Home Medications   Current Outpatient Rx  Name Route Sig Dispense Refill  . AMLODIPINE BESYLATE 5 MG PO TABS Oral Take 5 mg by mouth daily.      Marland Kitchen HYDROCODONE-ACETAMINOPHEN 5-500 MG PO TABS Oral Take 1-2 tablets by mouth every 6 (six) hours as needed for pain. 10 tablet 0  . METOPROLOL SUCCINATE ER 50 MG PO TB24 Oral Take 50 mg by mouth daily.      Marland Kitchen ONDANSETRON HCL 8 MG PO TABS Oral Take 1 tablet (8 mg total) by mouth every 8 (eight) hours as needed for nausea. 8 tablet 0  . PANTOPRAZOLE SODIUM 40 MG PO TBEC Oral Take 1 tablet (40 mg total) by mouth daily. 30 tablet 0  . PHENYTOIN SODIUM EXTENDED 100 MG PO CAPS Oral Take 200 mg by mouth 2 (two) times daily.      Marland Kitchen ROSUVASTATIN CALCIUM 40 MG PO TABS Oral Take 40 mg by mouth daily.        BP 143/87  Pulse 93  Resp 16  SpO2 98%  Physical Exam addednum to just completed ed note, same day ED Course  Procedures (including critical care time)  See earlier labs.    MDM  Iv ns bolus. zofran iv. Persistent nausea,  inability to tolerate po. Discussed w triad - will admit.         Suzi Roots, MD 07/14/11 [phone]

## 2011-07-14 NOTE — ED Notes (Signed)
Oob to bathroom called nurse c/o dizziness didn't pass out helped back to bed Dr Denton Lank aware

## 2011-07-14 NOTE — ED Provider Notes (Addendum)
History     CSN: [id number]  Arrival date & time 07/14/11  8295   First MD Initiated Contact with Patient 07/14/11 [phone]      Chief Complaint  Patient presents with  . Abdominal Pain    (Consider location/radiation/quality/duration/timing/severity/associated sxs/prior treatment) Patient is a 30 y.o. male presenting with abdominal pain. The history is provided by the patient.  Abdominal Pain The primary symptoms of the illness include abdominal pain and nausea. The primary symptoms of the illness do not include fever or shortness of breath.  Symptoms associated with the illness do not include back pain.  pt c/o epigastric pain and nv in past day. Several episodes emesis. Emesis clear w pink streaks. Constant, dull, epigastric pain, no radiation. No specific exacerbating or alleviating factors. No fever or chills. Hx etoh abuse, continuing to drink almost daily. Hx pancreatitis, and gastritis. No hx pud. Is having normal bms, including today. Denies chest pain. No sob.   Past Medical History  Diagnosis Date  . Seizures     No past surgical history on file.  No family history on file.  History  Substance Use Topics  . Smoking status: Not on file  . Smokeless tobacco: Not on file  . Alcohol Use: Yes      Review of Systems  Constitutional: Negative for fever.  HENT: Negative for sore throat and neck pain.   Eyes: Negative for redness.  Respiratory: Negative for cough and shortness of breath.   Cardiovascular: Negative for chest pain.  Gastrointestinal: Positive for nausea and abdominal pain.  Genitourinary: Negative for flank pain.  Musculoskeletal: Negative for back pain.  Skin: Negative for rash.  Neurological: Negative for headaches.  Hematological: Does not bruise/bleed easily.  Psychiatric/Behavioral: Negative for confusion.    Allergies  Morphine and Penicillins  Home Medications   Current Outpatient Rx  Name Route Sig Dispense Refill  . ONDANSETRON HCL 4  MG/5ML PO SOLN Intravenous Inject 4 mg into the vein once.      Marland Kitchen ZOLMITRIPTAN 2.5 MG PO TABS Oral Take 2.5 mg by mouth as needed.        BP 146/96  Pulse 95  Temp(Src) 97.7 F (36.5 C) (Oral)  Resp 20  SpO2 97%  Physical Exam  Nursing note and vitals reviewed. Constitutional: He is oriented to person, place, and time. He appears well-developed and well-nourished. No distress.  HENT:  Head: Atraumatic.  Eyes: Pupils are equal, round, and reactive to light.  Neck: Neck supple. No tracheal deviation present.  Cardiovascular: Normal rate, regular rhythm, normal heart sounds and intact distal pulses.  Exam reveals no gallop and no friction rub.   No murmur heard. Pulmonary/Chest: Effort normal. No accessory muscle usage. No respiratory distress. He has no rales.  Abdominal: Soft. Bowel sounds are normal. He exhibits no distension and no mass. There is tenderness. There is no rebound and no guarding.       Epigastric tenderness, no rebound or guarding.   Genitourinary:       No cva tenderness  Musculoskeletal: Normal range of motion.  Neurological: He is alert and oriented to person, place, and time.  Skin: Skin is warm and dry.  Psychiatric: He has a normal mood and affect.    ED Course  Procedures (including critical care time)  Results for orders placed during the hospital encounter of 07/14/11  CBC      Component Value Range   WBC 11.2 (*) 4.0 - 10.5 (K/uL)   RBC 5.56  4.22 - 5.81 (MIL/uL)   Hemoglobin 16.9  13.0 - 17.0 (g/dL)   HCT 40.9  81.1 - 91.4 (%)   MCV 83.6  78.0 - 100.0 (fL)   MCH 30.4  26.0 - 34.0 (pg)   MCHC 36.3 (*) 30.0 - 36.0 (g/dL)   RDW 78.2  95.6 - 21.3 (%)   Platelets 203  150 - 400 (K/uL)  COMPREHENSIVE METABOLIC PANEL      Component Value Range   Sodium 139  135 - 145 (mEq/L)   Potassium 3.3 (*) 3.5 - 5.1 (mEq/L)   Chloride 95 (*) 96 - 112 (mEq/L)   CO2 21  19 - 32 (mEq/L)   Glucose, Bld 126 (*) 70 - 99 (mg/dL)   BUN 13  6 - 23 (mg/dL)    Creatinine, Ser 0.86  0.50 - 1.35 (mg/dL)   Calcium 9.6  8.4 - 57.8 (mg/dL)   Total Protein 8.1  6.0 - 8.3 (g/dL)   Albumin 4.2  3.5 - 5.2 (g/dL)   AST 35  0 - 37 (U/L)   ALT 16  0 - 53 (U/L)   Alkaline Phosphatase 64  39 - 117 (U/L)   Total Bilirubin 0.4  0.3 - 1.2 (mg/dL)   GFR calc non Af Amer >90  >90 (mL/min)   GFR calc Af Amer >90  >90 (mL/min)  LIPASE, BLOOD      Component Value Range   Lipase 31  11 - 59 (U/L)  ETHANOL      Component Value Range   Alcohol, Ethyl (B) 84 (*) 0 - 11 (mg/dL)        MDM  IV ns bolus. protonix iv. zofran iv. Dilaudid 1 mg iv. Labs sent/pending.  Reviewed nursing notes and prior charts.   Recheck states pain improved, but persists. abd soft nt. No emesis. Dilaudid .5 mg iv. Also given pepcid and gi cocktail. Suspect etoh related gastritis.   At d/c pt becomes anxious. Ativan 1 mg po. Po fluids. Discussed labs, exam findings etc w pt. Dilantin low, pt non compliant w rx, no sz activity in ed or recently. Pt states has adequate meds at home.  Discussed importance close pcp follow up and to return to ER if worse, severe abdominal pain, fevers, persistent vomiting, other concern. Pt currently not interested in pursuing etoh/substance abuse rehab program, will give referrals.      Suzi Roots, MD 07/14/11 1029  Suzi Roots, MD 07/14/11 [phone]

## 2011-07-14 NOTE — ED Notes (Signed)
Pt statedthat he has not taken any of his medication for the past 2 months.  He has been drinking daily for the past 2 weeks.  He has done detox in the past.  He also admitted to doing crack last night.  Dr. Denton Lank made aware.

## 2011-07-14 NOTE — ED Notes (Signed)
Bed:WA20<BR> Expected date:<BR> Expected time:<BR> Means of arrival:<BR> Comments:<BR> seizure

## 2011-07-14 NOTE — ED Notes (Signed)
Dr Denton Lank aware of Dilantin level

## 2011-07-14 NOTE — ED Notes (Signed)
C/o dizziness iam" about to pass out"lying in bed at this time EDMD aware instructed not to get oob call nurse for assitance

## 2011-07-14 NOTE — H&P (Addendum)
PCP:   MULBERRY,ELIZABETH, MD   Chief Complaint:  Seizure with nausea and vomiting.  HPI: This is a 30 year old male with a history of bipolar, hyperlipidemia, hypertension, seizures were and alcoholism who presents to the emergency department with seizure that occurred this morning. The seizure was witnessed by her friend who reported general seizure activity. This was self limiting and resolved within one to 2 minutes to this was called the patient was transported to the emergency department. The patient reports having nausea and vomiting that started yesterday and has been unable to take his medications. Emesis consistent file and stomach contents after eating. She reports no abnormal fluids. He does have sick contacts in the form of niece and nephew. Denies fevers but complains of intermittent chills. He does have loose stools. He does report heavy alcohol intake, as well as trying crack cocaine yesterday before the emesis started. He does get tremors approximately 12 hours after alcohol withdrawal, but has never had an alcohol withdrawal seizure  Review of Systems:  The patient denies anorexia, fever, weight loss, vision loss, decreased hearing, hoarseness, chest pain, syncope, dyspnea on exertion, peripheral edema, balance deficits, hemoptysis, abdominal pain, melena, hematochezia, severe indigestion/heartburn, hematuria, incontinence, genital sores, muscle weakness, suspicious skin lesions, transient blindness, difficulty walking, depression, unusual weight change, abnormal bleeding, enlarged lymph nodes, angioedema, and breast masses.  Past Medical History: Past Medical History  Diagnosis Date  . Seizures   . Hypertension   . Depression    Past Surgical History  Procedure Date  . Chest surgery   . Left leg surgery     Medications: Prior to Admission medications   Medication Sig Start Date End Date Taking? Authorizing Provider  amLODipine (NORVASC) 5 MG tablet Take 5 mg by mouth  daily.     Yes Historical Provider, MD  HYDROcodone-acetaminophen (VICODIN) 5-500 MG per tablet Take 1-2 tablets by mouth every 6 (six) hours as needed for pain. 07/14/11 07/24/11 Yes Suzi Roots, MD  metoprolol (TOPROL-XL) 50 MG 24 hr tablet Take 50 mg by mouth daily.     Yes Historical Provider, MD  ondansetron (ZOFRAN) 8 MG tablet Take 1 tablet (8 mg total) by mouth every 8 (eight) hours as needed for nausea. 07/14/11 07/21/11 Yes Suzi Roots, MD  pantoprazole (PROTONIX) 40 MG tablet Take 1 tablet (40 mg total) by mouth daily. 07/14/11 07/13/12 Yes Suzi Roots, MD  phenytoin (DILANTIN) 100 MG ER capsule Take 200 mg by mouth 2 (two) times daily.     Yes Historical Provider, MD  rosuvastatin (CRESTOR) 40 MG tablet Take 40 mg by mouth daily.     Yes Historical Provider, MD    Allergies:   Allergies  Allergen Reactions  . Morphine     REACTION: itching  . Penicillins     REACTION: swelling    Social History:  reports that he has been smoking Cigarettes.  He has a 5 pack-year smoking history. He has never used smokeless tobacco. He reports that he drinks alcohol. He reports that he uses illicit drugs (Cocaine).  Family History: History reviewed. No pertinent family history.  Physical Exam: Filed Vitals:   07/14/11 1517 07/14/11 1518 07/14/11 1519 07/14/11 1520  BP:  152/88 154/96 164/104  Pulse: 82 82 102 91  Temp: 98 F (36.7 C)     TempSrc: Oral     Resp: 18     SpO2: 98%      General appearance: alert, cooperative, appears stated age and no distress Head:  Normocephalic, without obvious abnormality, atraumatic Eyes: conjunctivae/corneas clear. PERRL, EOM's intact. Fundi benign. Nose: Nares normal. Septum midline. Mucosa normal. No drainage or sinus tenderness. Neck: no adenopathy, no carotid bruit, no JVD, supple, symmetrical, trachea midline and thyroid not enlarged, symmetric, no tenderness/mass/nodules Resp: clear to auscultation bilaterally Cardio: regular rate and  rhythm, S1, S2 normal, no murmur, click, rub or gallop GI: Epigastric and is to palpation. Abdomen is soft and nondistended. There are appropriate bowel sounds. Extremities: extremities normal, atraumatic, no cyanosis or edema Pulses: 2+ and symmetric Skin: Skin color, texture, turgor normal. No rashes or lesions Lymph nodes: Cervical, supraclavicular, and axillary nodes normal. Neurologic: Coordination: Fine movement noted which is worse with intention.   Labs on Admission:   [facility] 07/14/11 0915  NA 139  K 3.3*  CL 95*  CO2 21  GLUCOSE 126*  BUN 13  CREATININE 0.86  CALCIUM 9.6  MG --  PHOS --    Basename 07/14/11 0915  AST 35  ALT 16  ALKPHOS 64  BILITOT 0.4  PROT 8.1  ALBUMIN 4.2    Basename 07/14/11 0915  LIPASE 31  AMYLASE --    Basename 07/14/11 0915  WBC 11.2*  NEUTROABS --  HGB 16.9  HCT 46.5  MCV 83.6  PLT 203   Radiological Exams on Admission: No results found.  Assessment/Plan #1 seizure #2 nausea and vomiting #3 polysubstance abuse, or dominant alcohol #4 hypertension #5 bipolar  With the patient under seizure precautions. Will load phenytoin and convert phenytoin to IV. Start CIWA protocol for alcohol withdrawal. Will continue antiemetics. Will obtain a urine tox screen. Social work consult. Continue home meds. Lovenox for DVT prophylaxis. The patient is a full code  Candelaria Celeste JEHIEL 07/14/2011, 3:24 PM

## 2011-07-15 LAB — BASIC METABOLIC PANEL WITH GFR
BUN: 8 mg/dL (ref 6–23)
CO2: 28 meq/L (ref 19–32)
Calcium: 9 mg/dL (ref 8.4–10.5)
Chloride: 99 meq/L (ref 96–112)
Creatinine, Ser: 0.84 mg/dL (ref 0.50–1.35)
GFR calc Af Amer: >90 mL/min (ref >90)
GFR calc non Af Amer: >90 mL/min (ref >90)
Glucose, Bld: 95 mg/dL (ref 70–99)
Potassium: 3.7 meq/L (ref 3.5–5.1)
Sodium: 137 meq/L (ref 135–145)

## 2011-07-15 LAB — MRSA PCR SCREENING: MRSA by PCR: NEGATIVE

## 2011-07-15 MED ORDER — LORAZEPAM 2 MG/ML IJ SOLN
0.0000 mg | Freq: Four times a day (QID) | INTRAMUSCULAR | Status: DC
  Administered 2011-07-15: 2 mg via INTRAVENOUS
  Administered 2011-07-15: 1 mg via INTRAVENOUS
  Filled 2011-07-15 (×2): qty 1

## 2011-07-15 MED ORDER — PROMETHAZINE HCL 25 MG/ML IJ SOLN
25.0000 mg | Freq: Four times a day (QID) | INTRAMUSCULAR | Status: DC | PRN
  Administered 2011-07-15 – 2011-07-16 (×3): 25 mg via INTRAVENOUS
  Filled 2011-07-15 (×3): qty 1

## 2011-07-15 MED ORDER — LORAZEPAM 2 MG/ML IJ SOLN
1.0000 mg | Freq: Four times a day (QID) | INTRAMUSCULAR | Status: DC | PRN
  Administered 2011-07-15 – 2011-07-16 (×2): 1 mg via INTRAVENOUS
  Filled 2011-07-15 (×2): qty 1

## 2011-07-15 MED ORDER — ONDANSETRON HCL 4 MG/2ML IJ SOLN
4.0000 mg | Freq: Once | INTRAMUSCULAR | Status: AC
  Administered 2011-07-15: 4 mg via INTRAVENOUS
  Filled 2011-07-15: qty 2

## 2011-07-15 MED ORDER — PANTOPRAZOLE SODIUM 40 MG IV SOLR
40.0000 mg | Freq: Every day | INTRAVENOUS | Status: DC
  Administered 2011-07-15: 40 mg via INTRAVENOUS
  Filled 2011-07-15 (×2): qty 40

## 2011-07-15 MED ORDER — LORAZEPAM 2 MG/ML IJ SOLN: 0.0000 mg | Freq: Two times a day (BID) | INTRAMUSCULAR | Status: DC

## 2011-07-15 MED ORDER — FOLIC ACID 5 MG/ML IJ SOLN
1.0000 mg | Freq: Every day | INTRAMUSCULAR | Status: DC
  Administered 2011-07-15: 1 mg via INTRAVENOUS
  Filled 2011-07-15 (×2): qty 0.2

## 2011-07-15 MED ORDER — THIAMINE HCL 100 MG/ML IJ SOLN
100.0000 mg | Freq: Every day | INTRAMUSCULAR | Status: DC
  Filled 2011-07-15 (×2): qty 1

## 2011-07-15 MED ORDER — DIPHENHYDRAMINE HCL 25 MG PO CAPS
25.0000 mg | ORAL_CAPSULE | Freq: Four times a day (QID) | ORAL | Status: DC | PRN
  Administered 2011-07-15: 25 mg via ORAL
  Filled 2011-07-15: qty 1

## 2011-07-15 MED ORDER — POTASSIUM CHLORIDE IN NACL 40-0.9 MEQ/L-% IV SOLN
INTRAVENOUS | Status: DC
  Administered 2011-07-15 – 2011-07-16 (×3): via INTRAVENOUS
  Filled 2011-07-15 (×6): qty 1000

## 2011-07-15 MED ORDER — CLONIDINE HCL 0.2 MG/24HR TD PTWK
0.2000 mg | MEDICATED_PATCH | TRANSDERMAL | Status: DC
  Administered 2011-07-15: 0.2 mg via TRANSDERMAL
  Filled 2011-07-15: qty 1

## 2011-07-15 MED ORDER — THIAMINE HCL 100 MG/ML IJ SOLN
100.0000 mg | Freq: Every day | INTRAMUSCULAR | Status: DC
  Administered 2011-07-15: 100 mg via INTRAVENOUS
  Filled 2011-07-15 (×2): qty 1

## 2011-07-15 MED ORDER — HYDROMORPHONE HCL PF 2 MG/ML IJ SOLN
1.0000 mg | INTRAMUSCULAR | Status: DC | PRN
  Administered 2011-07-15 – 2011-07-16 (×3): 1 mg via INTRAVENOUS
  Filled 2011-07-15 (×3): qty 1

## 2011-07-15 NOTE — Progress Notes (Signed)
Spoke with patient at bedside. Had appt with [employer] for eligibility renewal and doctor appt for tomorrow. Rescheduled the appt for 08-09-10 for eligibility and 08-12-10 with Dr. Philipp Deputy for f/u. Patient is appreciative of assistance. Lives with parents and they are assisting him with meds and needs until eligibility complete. Will continue to follow for d/c needs.

## 2011-07-15 NOTE — Progress Notes (Signed)
Subjective: C/o Lightheadedness on standing and ambulation. No vomiting this am, although nauseated. Had 2 loose bowel movements today.  Objective: Vital signs in last 24 hours: Temp:  [97.8 F (36.6 C)-99.5 F (37.5 C)] 98.6 F (37 C) (12/27 0500) Pulse Rate:  [82-112] 100  (12/27 0818) Resp:  [16-20] 18  (12/27 0500) BP: (131-164)/(85-104) 148/94 mmHg (12/27 0500) SpO2:  [96 %-98 %] 97 % (12/27 0500) Weight:  [83.3 kg (183 lb 10.3 oz)] 183 lb 10.3 oz (83.3 kg) (12/26 1739) Weight change:     Intake/Output from previous day: 12/26 0701 - 12/27 0700 In: 3 [I.V.:3] Out: 1625 [Urine:1500; Emesis/NG output:125] Total I/O In: 0  Out: 40 [Emesis/NG output:40]   Physical Exam: General: Comfortable, alert, communicative, fully oriented, not short of breath at rest.  HEENT:  No clinical pallor, no jaundice, no conjunctival injection or discharge. Visible buccal mucosae, appear dry. NECK:  Supple, JVP not seen, no carotid bruits, no palpable lymphadenopathy, no palpable goiter. CHEST:  Clinically clear to auscultation, no wheezes, no crackles. HEART:  Sounds 1 and 2 heard, normal, regular, no murmurs. ABDOMEN:  Full, soft, mild epigastric tenderness, no palpable organomegaly, no palpable masses, normal bowel sounds. GENITALIA:  Not examined. LOWER EXTREMITIES:  No pitting edema, palpable peripheral pulses. MUSCULOSKELETAL SYSTEM:  Unremarkablel. CENTRAL NERVOUS SYSTEM:  No focal neurologic deficit on gross examination.  Lab Results:  [facility] 07/14/11 0915  WBC 11.2*  HGB 16.9  HCT 46.5  PLT 203    Basename 07/15/11 0429 07/14/11 0915  NA 137 139  K 3.7 3.3*  CL 99 95*  CO2 28 21  GLUCOSE 95 126*  BUN 8 13  CREATININE 0.84 0.86  CALCIUM 9.0 9.6   Recent Results (from the past 240 hour(s))  MRSA PCR SCREENING     Status: Normal   Collection Time   07/15/11  1:25 AM      Component Value Range Status Comment   MRSA by PCR NEGATIVE  NEGATIVE  Final       Studies/Results: No results found.  Medications: Scheduled Meds:   . amLODipine  5 mg Oral Daily  . enoxaparin  40 mg Subcutaneous Q24H  . folic acid  1 mg Oral Daily  . influenza  inactive virus vaccine  0.5 mL Intramuscular Tomorrow-1000  . LORazepam  0-4 mg Intravenous Q6H   Followed by  . LORazepam  0-4 mg Intravenous Q12H  . metoprolol  50 mg Oral Daily  . mulitivitamin with minerals  1 tablet Oral Daily  . ondansetron (ZOFRAN) IV  4 mg Intravenous Once  . ondansetron  4 mg Intravenous Once  . pantoprazole  40 mg Oral Daily  . phenytoin (DILANTIN) IV  1,500 mg Intravenous To ER  . phenytoin (DILANTIN) IV  120 mg Intravenous Q8H  . phenytoin (DILANTIN) IV  120 mg Intravenous Once  . pneumococcal 23 valent vaccine  0.5 mL Intramuscular Tomorrow-1000  . rosuvastatin  40 mg Oral Daily  . sodium chloride  1,000 mL Intravenous Once  . sodium chloride  3 mL Intravenous Q12H  . thiamine  100 mg Intravenous Daily  . thiamine  100 mg Oral Daily  . DISCONTD: influenza  inactive virus vaccine  0.5 mL Intramuscular Tomorrow-1000  . DISCONTD: phenytoin (DILANTIN) IV  120 mg Intravenous Q8H  . DISCONTD: phenytoin (DILANTIN) IV  20 mg/kg Intravenous Once  . DISCONTD: thiamine  100 mg Intravenous Daily   Continuous Infusions:  PRN Meds:.sodium chloride, acetaminophen, acetaminophen, alum & mag hydroxide-simeth,  HYDROcodone-acetaminophen, LORazepam, ondansetron (ZOFRAN) IV, ondansetron, sodium chloride, DISCONTD: LORazepam, DISCONTD: LORazepam  Assessment/Plan:  Active Problems:  1. Seizure episode: Patient has been non-compliant with Dilantin therapy for one month, due to financial reasons. He now states that family has agreed to help him out, so he will be compliant from now on. He has been loaded with Dilantin, and commenced on maintenance treatment. No seizures recorded overnight. 2. Acute Gastritis: Possibly viral, vs ETOH related. Will manage with bowel rest, PPT, ant--emetics.  Fortunately, Lipase is normal at 31.  3. Polysubstance abuse/alcohol abuse: Counseled. On CIWA protocol. No features of ETOH withdrawal, today. Patient had acute alochol intoxication at presentation (seee ETOH levels). Now resolved. 4. Hypertension: Will manage with  Clonidine patch. 5. Bipolar: Stable.  Comment: Patient is still mildly tachycardic, and clinically volume depleted. Will manage with iv fluids.   LOS: 1 day   Emmelyn Schmale,CHRISTOPHER 07/15/2011, 8:52 AM

## 2011-07-16 LAB — COMPREHENSIVE METABOLIC PANEL
Albumin: 3.4 g/dL — ABNORMAL LOW (ref 3.5–5.2)
Alkaline Phosphatase: 52 U/L (ref 39–117)
BUN: 4 mg/dL — ABNORMAL LOW (ref 6–23)
Calcium: 8.9 mg/dL (ref 8.4–10.5)
Creatinine, Ser: 0.85 mg/dL (ref 0.50–1.35)
GFR calc Af Amer: >90 mL/min (ref >90)
Glucose, Bld: 105 mg/dL — ABNORMAL HIGH (ref 70–99)
Potassium: 4.3 mEq/L (ref 3.5–5.1)
Total Protein: 6.9 g/dL (ref 6.0–8.3)

## 2011-07-16 LAB — CBC
HCT: 42.6 % (ref 39.0–52.0)
Hemoglobin: 15.3 g/dL (ref 13.0–17.0)
MCH: 30.3 pg (ref 26.0–34.0)
MCHC: 35.9 g/dL (ref 30.0–36.0)
MCV: 84.4 fL (ref 78.0–100.0)
RDW: 14.5 % (ref 11.5–15.5)

## 2011-07-16 MED ORDER — PHENYTOIN SODIUM EXTENDED 100 MG PO CAPS: 200.0000 mg | ORAL_CAPSULE | Freq: Two times a day (BID) | ORAL | Status: DC

## 2011-07-16 MED ORDER — METOPROLOL SUCCINATE ER 50 MG PO TB24: 50.0000 mg | ORAL_TABLET | Freq: Every day | ORAL | Status: DC

## 2011-07-16 NOTE — Progress Notes (Signed)
Pt has been d/c to the home. Pt verbalizes understanding of all d/c instructions and state there are no further questions. Pt taken to car via wheelchair. Marcelino Duster, RN

## 2011-07-16 NOTE — Discharge Summary (Signed)
Physician Discharge Summary  [name] MVH:[id number] DOB: 01-22-1981 DOA: 07/14/2011  PCP: [name] Manson, [name]  Admit date: 07/14/2011 Discharge date: 07/16/2011  Discharge Diagnoses:  1. Seizure, prior to admission 2. Known seizure disorder 3. Acute gastritis: Viral versus alcohol-induced. 4. Polysubstance abuse 5. Hypertension, stable 6. Bipolar disorder, stable  Discharge Condition: Improved  Disposition: Home  History of present illness:  30 year old man presented emergency department December 26 with abdominal pain. He was actually discharged from the emergency room however while waiting for a ride he said he passed out. However the emergency department physician's note denies syncope or seizure. His history is notable for noncompliance with antiseizure medication, daily alcohol abuse and recent use of crack cocaine.  Per admitting physician's documentation the patient complained of seizure. This occurred at home prior to admission. It was self limited and resolved in approximately 1-2 minutes. Complained of nausea and vomiting and inability to take medications. He was admitted for history of seizure as well as nausea and vomiting and polysubstance abuse. Phenytoin was loaded.  Hospital Course:  Mr. [name] was admitted to the medical floor. He had no further seizures. He is now significant signs or symptoms of alcohol withdrawal. He is tolerating a liquid diet and feels ready to home. He has mild abdominal pain but benign examination. The etiology of his seizures probably noncompliance with his medication and probable contribution crack cocaine. His parents will be financially assisting him and he tells me he will be taking Dilantin. 1. Seizure disorder: No recurrence. Patient noncompliant with Dilantin therapy for one month secondary to financial reasons. However, his family has agreed to assist him financially and he states he will be compliant. He states he has a prescription  which he will pick up.  2. Acute gastritis: Viral versus alcohol induced. Appears to be resolved at this point. Tolerating liquids. He reports mild abdominal pain however he would like to go home. Exam of abdomen is benign.  3. Polysubstance abuse: Recommend complete abstinence. Patient had acute alcohol intoxication on presentation.  4. Hypertension: Stable.  5. Bipolar disorder: Stable. 6.  Consultants:  None  Procedures:  None  Discharge Instructions  Discharge Orders    Future Appointments: Provider: Department: Dept Phone: Center:   11/30/2011 1:30 PM [facility] [phone] [facility]   11/30/2011 2:00 PM [facility] [phone] [facility]   11/30/2011 2:30 PM [name] [phone] [facility]     Future Orders Please Complete By Expires   Diet - low sodium heart healthy      Activity as tolerated - No restrictions      Driving Restrictions      Comments:   No driving until cleared by primary care physician.   Other Restrictions      Comments:   No open water swimming. No ladders. No baths unsupervised.     Current Discharge Medication List    CONTINUE these medications which have CHANGED   Details  phenytoin (DILANTIN) 100 MG ER capsule Take 2 capsules (200 mg total) by mouth 2 (two) times daily. Qty: 120 capsule, Refills: 0      CONTINUE these medications which have NOT CHANGED   Details  amLODipine (NORVASC) 5 MG tablet Take 5 mg by mouth daily.      HYDROcodone-acetaminophen (VICODIN) 5-500 MG per tablet Take 1-2 tablets by mouth every 6 (six) hours as needed for pain. Qty: 10 tablet, Refills: 0    metoprolol (TOPROL-XL) 50 MG 24 hr tablet Take 50  mg by mouth daily.      ondansetron (ZOFRAN) 8 MG tablet Take 1 tablet (8 mg total) by mouth every 8 (eight) hours as needed for nausea. Qty: 8 tablet, Refills: 0    pantoprazole (PROTONIX) 40 MG tablet Take 1 tablet (40 mg total) by mouth daily. Qty: 30 tablet, Refills: 0      rosuvastatin (CRESTOR) 40 MG tablet Take 40 mg by mouth daily.         Follow-up Information    Follow up with [name] on 08/10/2011. (at 12:00 noon for Eligibility Appt)    Contact information:   [phone]      Follow up with [name] on [name]. (at 2:45 pm)    Contact information:   [address] [place] [address] [phone]           The results of significant diagnostics from this hospitalization (including imaging, microbiology, ancillary and laboratory) are listed below for reference.    Microbiology: Recent Results (from the past 240 hour(s))  MRSA PCR SCREENING     Status: Normal   Collection Time   07/15/11  1:25 AM      Component Value Range Status Comment   MRSA by PCR NEGATIVE  NEGATIVE  Final      Labs: Basic Metabolic Panel:  Lab 07/16/11 2841 07/15/11 0429 07/14/11 0915  NA 136 137 139  K 4.3 3.7 --  CL 101 99 95*  CO2 24 28 21   GLUCOSE 105* 95 126*  BUN 4* 8 13  CREATININE 0.85 0.84 0.86  CALCIUM 8.9 9.0 9.6  MG -- -- --  PHOS -- -- --   Liver Function Tests:  Lab 07/16/11 0440 07/14/11 0915  AST 45* 35  ALT 17 16  ALKPHOS 52 64  BILITOT 0.6 0.4  PROT 6.9 8.1  ALBUMIN 3.4* 4.2    Lab 07/14/11 0915  LIPASE 31  AMYLASE --   CBC:  Lab 07/16/11 0440 07/14/11 0915  WBC 6.7 11.2*  NEUTROABS -- --  HGB 15.3 16.9  HCT 42.6 46.5  MCV 84.4 83.6  PLT 130* 203   Time coordinating discharge: 25 minutes  Signed:  Brendia Sacks, [name]  [name] 07/16/2011, 2:45 PM

## 2011-07-16 NOTE — Progress Notes (Signed)
PROGRESS NOTE  [name] ZOX:[id number] DOB: 09/03/1980 DOA: 07/14/2011 PCP: Julieanne Manson, MD  Brief narrative: 30 year old man presented emergency department December 26 with abdominal pain. He was actually discharged from the emergency room however while waiting for right he said up and passed out. However the emergency department physician's note denies syncope or seizure. His history is notable for noncompliance with antiseizure medication, daily alcohol abuse and recent use of crack cocaine.  Per admitting physicians documentation the patient complained of seizure. This occurred at home prior to admission. He was self limited and resolved approximately 2 minutes. Complained of nausea and vomiting and inability to take medications. He was admitted for history of seizure as well as nausea and vomiting and polysubstance abuse. Phenytoin and was loaded.  Past medical history: Alcohol abuse, pancreatitis, gastritis, seizure disorder, hypertension, depression, bipolar disorder  Consultants:  None  Procedures:  None  Antibiotics:  None  Interim History: Interval documentation reviewed. Per RN no concerns. No seizures. Subjective: Feels better. No seizures. Some vomiting early in shift but has been taking liquids without difficulty.  Objective: Filed Vitals:   07/15/11 2127 07/16/11 0153 07/16/11 0529 07/16/11 1200  BP: 115/77 125/82 129/84 128/85  Pulse: 95 92 81 85  Temp: 98.6 F (37 C) 98.2 F (36.8 C) 98.6 F (37 C) 97.9 F (36.6 C)  TempSrc: Oral Oral Oral Oral  Resp: 20 20 20 19   Height:      Weight:      SpO2: 99% 96% 99% 99%    Intake/Output Summary (Last 24 hours) at 07/16/11 1412 Last data filed at 07/16/11 1200  Gross per 24 hour  Intake 2493.33 ml  Output   5775 ml  Net -3281.67 ml    Exam:  General: Appears calm and comfortable. Cardiovascular: Regular rate and rhythm. No murmur, rub or gallop. Respiratory: Clear to auscultation bilaterally.  No wheezes, rales or rhonchi. Normal respiratory effort Abdomen: Soft, nontender and nondistended. Positive bowel sounds.  Data Reviewed: Basic Metabolic Panel:  Lab 07/16/11 8119 07/15/11 0429 07/14/11 0915  NA 136 137 139  K 4.3 3.7 --  CL 101 99 95*  CO2 24 28 21   GLUCOSE 105* 95 126*  BUN 4* 8 13  CREATININE 0.85 0.84 0.86  CALCIUM 8.9 9.0 9.6  MG -- -- --  PHOS -- -- --   Liver Function Tests:  Lab 07/16/11 0440 07/14/11 0915  AST 45* 35  ALT 17 16  ALKPHOS 52 64  BILITOT 0.6 0.4  PROT 6.9 8.1  ALBUMIN 3.4* 4.2    Lab 07/14/11 0915  LIPASE 31  AMYLASE --   CBC:  Lab 07/16/11 0440 07/14/11 0915  WBC 6.7 11.2*  NEUTROABS -- --  HGB 15.3 16.9  HCT 42.6 46.5  MCV 84.4 83.6  PLT 130* 203    Recent Results (from the past 240 hour(s))  MRSA PCR SCREENING     Status: Normal   Collection Time   07/15/11  1:25 AM      Component Value Range Status Comment   MRSA by PCR NEGATIVE  NEGATIVE  Final    Scheduled Meds:   . cloNIDine  0.2 mg Transdermal Weekly  . enoxaparin  40 mg Subcutaneous Q24H  . folic acid  1 mg Intravenous Daily  . LORazepam  0-4 mg Intravenous Q6H   Followed by  . LORazepam  0-4 mg Intravenous Q12H  . metoprolol  50 mg Oral Daily  . mulitivitamin with minerals  1 tablet Oral Daily  .  pantoprazole (PROTONIX) IV  40 mg Intravenous QHS  . phenytoin (DILANTIN) IV  120 mg Intravenous Q8H  . thiamine  100 mg Intravenous Daily  . DISCONTD: thiamine  100 mg Intravenous Daily   Continuous Infusions:   . 0.9 % NaCl with KCl 40 mEq / L 125 mL/hr at 07/16/11 1610     Assessment/Plan: 1. Seizure disorder: No recurrence. Patient noncompliant with Dilantin therapy for one month secondary to financial reasons. However, his family has agreed to assist him financially and he states he will be compliant. He states he has a prescription which he will pick up. 2. Acute gastritis: Viral versus alcohol induced. Appears to be resolved at this point.  Tolerating liquids. He reports mild abdominal pain however he would like to go home. Exam of abdomen is benign. 3. Polysubstance abuse: Recommend complete abstinence. Patient had acute alcohol intoxication on presentation. 4. Hypertension: Stable. 5. Bipolar disorder: Stable.  Code Status: Full code.  Disposition Plan: Home today.   Brendia Sacks, MD  [facility] Pager [phone] 07/16/2011, 2:12 PM    LOS: 2 days

## 2011-10-05 ENCOUNTER — Encounter (HOSPITAL_COMMUNITY): Payer: Self-pay

## 2011-10-05 ENCOUNTER — Emergency Department (HOSPITAL_COMMUNITY)
Admission: EM | Admit: 2011-10-05 | Discharge: 2011-10-06 | Disposition: A | Discharge status: Home / Self Care | Payer: Self-pay | Attending: Emergency Medicine | Admitting: Emergency Medicine

## 2011-10-05 DIAGNOSIS — R197 Diarrhea, unspecified: Secondary | ICD-10-CM | POA: Insufficient documentation

## 2011-10-05 DIAGNOSIS — F329 Major depressive disorder, single episode, unspecified: Secondary | ICD-10-CM | POA: Insufficient documentation

## 2011-10-05 DIAGNOSIS — I10 Essential (primary) hypertension: Secondary | ICD-10-CM | POA: Insufficient documentation

## 2011-10-05 DIAGNOSIS — R5381 Other malaise: Secondary | ICD-10-CM | POA: Insufficient documentation

## 2011-10-05 DIAGNOSIS — R10819 Abdominal tenderness, unspecified site: Secondary | ICD-10-CM | POA: Insufficient documentation

## 2011-10-05 DIAGNOSIS — R42 Dizziness and giddiness: Secondary | ICD-10-CM | POA: Insufficient documentation

## 2011-10-05 DIAGNOSIS — R1012 Left upper quadrant pain: Secondary | ICD-10-CM | POA: Insufficient documentation

## 2011-10-05 DIAGNOSIS — Z79899 Other long term (current) drug therapy: Secondary | ICD-10-CM | POA: Insufficient documentation

## 2011-10-05 DIAGNOSIS — R112 Nausea with vomiting, unspecified: Secondary | ICD-10-CM | POA: Insufficient documentation

## 2011-10-05 DIAGNOSIS — F3289 Other specified depressive episodes: Secondary | ICD-10-CM | POA: Insufficient documentation

## 2011-10-05 DIAGNOSIS — F17200 Nicotine dependence, unspecified, uncomplicated: Secondary | ICD-10-CM | POA: Insufficient documentation

## 2011-10-05 HISTORY — DX: Acute pancreatitis without necrosis or infection, unspecified: K85.90

## 2011-10-05 HISTORY — DX: Unspecified cirrhosis of liver: K74.60

## 2011-10-05 LAB — COMPREHENSIVE METABOLIC PANEL
ALT: 40 U/L (ref 0–53)
AST: 49 U/L — ABNORMAL HIGH (ref 0–37)
CO2: 23 mEq/L (ref 19–32)
Chloride: 97 mEq/L (ref 96–112)
GFR calc Af Amer: >90 mL/min (ref >90)
GFR calc non Af Amer: >90 mL/min (ref >90)
Glucose, Bld: 86 mg/dL (ref 70–99)
Sodium: 134 mEq/L — ABNORMAL LOW (ref 135–145)
Total Bilirubin: 0.5 mg/dL (ref 0.3–1.2)

## 2011-10-05 LAB — DIFFERENTIAL
Basophils Absolute: 0 10*3/uL (ref 0.0–0.1)
Lymphocytes Relative: 42 % (ref 12–46)
Lymphs Abs: 1.8 10*3/uL (ref 0.7–4.0)
Monocytes Absolute: 0.5 10*3/uL (ref 0.1–1.0)
Neutro Abs: 2 10*3/uL (ref 1.7–7.7)

## 2011-10-05 LAB — CBC
HCT: 43.8 % (ref 39.0–52.0)
MCV: 84.6 fL (ref 78.0–100.0)
Platelets: 166 10*3/uL (ref 150–400)
RBC: 5.18 MIL/uL (ref 4.22–5.81)
RDW: 13.8 % (ref 11.5–15.5)
WBC: 4.2 10*3/uL (ref 4.0–10.5)

## 2011-10-05 MED ORDER — DIPHENHYDRAMINE HCL 50 MG/ML IJ SOLN
12.5000 mg | Freq: Once | INTRAMUSCULAR | Status: AC
  Administered 2011-10-05: 12.5 mg via INTRAVENOUS
  Filled 2011-10-05: qty 1

## 2011-10-05 MED ORDER — HYDROMORPHONE HCL PF 1 MG/ML IJ SOLN
1.0000 mg | Freq: Once | INTRAMUSCULAR | Status: AC
  Administered 2011-10-05: 1 mg via INTRAVENOUS
  Filled 2011-10-05: qty 1

## 2011-10-05 MED ORDER — SODIUM CHLORIDE 0.9 % IV BOLUS (SEPSIS)
1000.0000 mL | Freq: Once | INTRAVENOUS | Status: AC
  Administered 2011-10-05: 1000 mL via INTRAVENOUS

## 2011-10-05 MED ORDER — ONDANSETRON HCL 4 MG/2ML IJ SOLN
4.0000 mg | Freq: Once | INTRAMUSCULAR | Status: AC
  Administered 2011-10-05: 4 mg via INTRAVENOUS
  Filled 2011-10-05: qty 2

## 2011-10-05 NOTE — ED Provider Notes (Signed)
History     CSN: [id number]  Arrival date & time 10/05/11  1549   First MD Initiated Contact with Patient 10/05/11 2057      Chief Complaint  Patient presents with  . Emesis    (Consider location/radiation/quality/duration/timing/severity/associated sxs/prior treatment) HPI Comments: This 78 year gentleman has a history of pancreatitis on Saturday started with nausea and vomiting and by Sunday morning was having mucousy green diarrhea as well this has been persistent for the last 2 days.  He denies any alcoholic beverages in the last 3 months.  He is having increased left upper quadrant pain that he is having blood-streaked emesis for the last 24 hours  Patient is a 31 y.o. male presenting with vomiting. The history is provided by the patient.  Emesis  This is a recurrent problem. The current episode started 2 days ago. The problem occurs 5 to 10 times per day. The problem has not changed since onset.The emesis has an appearance of bilious material. There has been no fever. Associated symptoms include abdominal pain and diarrhea. Pertinent negatives include no chills and no fever.    Past Medical History  Diagnosis Date  . Seizures   . Hypertension   . Depression   . Pancreatitis   . Liver cirrhosis     Past Surgical History  Procedure Date  . Chest surgery   . Left leg surgery     No family history on file.  History  Substance Use Topics  . Smoking status: Current Everyday Smoker -- 0.2 packs/day for 20 years    Types: Cigarettes  . Smokeless tobacco: Never Used  . Alcohol Use: No      Review of Systems  Constitutional: Negative for fever and chills.  HENT: Negative for congestion.   Respiratory: Negative for shortness of breath.   Cardiovascular: Negative for chest pain.  Gastrointestinal: Positive for nausea, vomiting, abdominal pain and diarrhea.  Genitourinary: Negative for decreased urine volume.  Musculoskeletal: Negative for back pain.  Neurological:  Positive for dizziness and weakness. Negative for numbness.    Allergies  Morphine and Penicillins  Home Medications   Current Outpatient Rx  Name Route Sig Dispense Refill  . AMLODIPINE BESYLATE 10 MG PO TABS Oral Take 10 mg by mouth daily.    . ARIPIPRAZOLE 10 MG PO TABS Oral Take 10 mg by mouth daily.    . BUSPIRONE HCL 15 MG PO TABS Oral Take 7.5 mg by mouth 2 (two) times daily.    Marland Kitchen GABAPENTIN 300 MG PO CAPS Oral Take 300-600 mg by mouth 3 (three) times daily. Take 1 capsule by mouth twice daily and then, take 2 capsules by mouth at bedtime    . METOPROLOL SUCCINATE ER 50 MG PO TB24 Oral Take 1 tablet (50 mg total) by mouth daily. 30 tablet 0  . PHENYTOIN SODIUM EXTENDED 100 MG PO CAPS Oral Take 2 capsules (200 mg total) by mouth 2 (two) times daily. 120 capsule 0  . ROSUVASTATIN CALCIUM 40 MG PO TABS Oral Take 40 mg by mouth at bedtime.     . TRAZODONE HCL 100 MG PO TABS Oral Take 100-200 mg by mouth at bedtime.      BP 136/93  Pulse 74  Temp(Src) 98.7 F (37.1 C) (Oral)  Resp 17  Ht 5\' 6"  (1.676 m)  Wt 173 lb (78.472 kg)  BMI 27.92 kg/m2  SpO2 95%  Physical Exam  Constitutional: He is oriented to person, place, and time. He appears well-developed and  well-nourished.  HENT:  Head: Normocephalic.  Eyes: Pupils are equal, round, and reactive to light.  Neck: Normal range of motion.  Cardiovascular: Normal rate.   Pulmonary/Chest: Effort normal.  Abdominal: He exhibits no pulsatile liver and no ascites. Bowel sounds are increased. There is no hepatomegaly. There is tenderness in the suprapubic area and left upper quadrant. There is guarding.    Musculoskeletal: Normal range of motion.  Neurological: He is alert and oriented to person, place, and time.  Skin: Skin is warm.    ED Course  Procedures (including critical care time)   Labs Reviewed  CBC  DIFFERENTIAL  COMPREHENSIVE METABOLIC PANEL  LIPASE, BLOOD  PHENYTOIN LEVEL, TOTAL      1. Nausea vomiting  and diarrhea   2. Abdominal pain       MDM  This is most likely an exacerbation of his chronic recurrent pancreatitis    Medical screening examination/treatment/procedure(s) were performed by non-physician practitioner and as supervising physician I was immediately available for consultation/collaboration. Osvaldo Human, M.D.     Arman Filter, NP 10/06/11 0133  Carleene Cooper III, MD 10/06/11 [phone]

## 2011-10-05 NOTE — ED Notes (Signed)
Patient having abdominal pain, nausea and vomiting since Saturday.  Patient states that he has seen blood in his vomitus and in his stool.   Patient is CAOx3, has not vomited since Sunday.  Patient states that his stool has been soft.  Patient is nauseated at this time.

## 2011-10-05 NOTE — ED Notes (Signed)
ffor the last 5 days pt. Has had n/vd, also reports having blood in his stool and vomit.[name]

## 2011-10-06 MED ORDER — ONDANSETRON HCL 4 MG PO TABS: 4.0000 mg | ORAL_TABLET | Freq: Four times a day (QID) | ORAL | Status: AC

## 2011-10-06 NOTE — ED Notes (Signed)
Pt given ginger ale 240 ml for PO Challenge, plan of care is updated with verbal understanding. Family at bedside, will continue to monitor pt.

## 2011-10-06 NOTE — ED Notes (Signed)
Pt denies any pain or questions upon discharge. 

## 2011-10-06 NOTE — Discharge Instructions (Signed)
Abdominal Pain (Nonspecific) Your exam might not show the exact reason you have abdominal pain. Since there are many different causes of abdominal pain, another checkup and more tests may be needed. It is very important to follow up for lasting (persistent) or worsening symptoms. A possible cause of abdominal pain in any person who still has his or her appendix is acute appendicitis. Appendicitis is often hard to diagnose. Normal blood tests, urine tests, ultrasound, and CT scans do not completely rule out early appendicitis or other causes of abdominal pain. Sometimes, only the changes that happen over time will allow appendicitis and other causes of abdominal pain to be determined. Other potential problems that may require surgery may also take time to become more apparent. Because of this, it is important that you follow all of the instructions below. HOME CARE INSTRUCTIONS   Rest as much as possible.   Do not eat solid food until your pain is gone.   While adults or children have pain: A diet of water, weak decaffeinated tea, broth or bouillon, gelatin, oral rehydration solutions (ORS), frozen ice pops, or ice chips may be helpful.   When pain is gone in adults or children: Start a light diet (dry toast, crackers, applesauce, or white rice). Increase the diet slowly as long as it does not bother you. Eat no dairy products (including cheese and eggs) and no spicy, fatty, fried, or high-fiber foods.   Use no alcohol, caffeine, or cigarettes.   Take your regular medicines unless your caregiver told you not to.   Take any prescribed medicine as directed.   Only take over-the-counter or prescription medicines for pain, discomfort, or fever as directed by your caregiver. Do not give aspirin to children.  If your caregiver has given you a follow-up appointment, it is very important to keep that appointment. Not keeping the appointment could result in a permanent injury and/or lasting (chronic) pain  and/or disability. If there is any problem keeping the appointment, you must call to reschedule.  SEEK IMMEDIATE MEDICAL CARE IF:   Your pain is not gone in 24 hours.   Your pain becomes worse, changes location, or feels different.   You or your child has an oral temperature above 102 F (38.9 C), not controlled by medicine.   Your baby is older than 3 months with a rectal temperature of 102 F (38.9 C) or higher.   Your baby is 52 months old or younger with a rectal temperature of 100.4 F (38 C) or higher.   You have shaking chills.   You keep throwing up (vomiting) or cannot drink liquids.   There is blood in your vomit or you see blood in your bowel movements.   Your bowel movements become dark or black.   You have frequent bowel movements.   Your bowel movements stop (become blocked) or you cannot pass gas.   You have bloody, frequent, or painful urination.   You have yellow discoloration in the skin or whites of the eyes.   Your stomach becomes bloated or bigger.   You have dizziness or fainting.   You have chest or back pain.  MAKE SURE YOU:   Understand these instructions.   Will watch your condition.   Will get help right away if you are not doing well or get worse.  Document Released: 07/05/2005 Document Revised: 06/24/2011 Document Reviewed: 06/02/2009 [facility] Patient Information 2012 [place], [place]. As discussed today, your labs look within the normal range.  Her lipase  is normal at indicating, that you're having an acute exacerbation of chronic pancreatitis.  He be given a prescription for Zofran that you can take to control the symptoms.  Please try to follow a light diet for the next few days.  Contact Dr. Rubye Oaks for a followup appointment

## 2011-10-10 ENCOUNTER — Emergency Department (HOSPITAL_COMMUNITY)
Admission: EM | Admit: 2011-10-10 | Discharge: 2011-10-12 | Disposition: A | Discharge status: Other Acute Inpatient Hospital | Payer: Self-pay | Attending: Emergency Medicine | Admitting: Emergency Medicine

## 2011-10-10 ENCOUNTER — Other Ambulatory Visit: Payer: Self-pay

## 2011-10-10 ENCOUNTER — Encounter (HOSPITAL_COMMUNITY): Payer: Self-pay | Admitting: *Deleted

## 2011-10-10 DIAGNOSIS — F3289 Other specified depressive episodes: Secondary | ICD-10-CM | POA: Insufficient documentation

## 2011-10-10 DIAGNOSIS — K7460 Unspecified cirrhosis of liver: Secondary | ICD-10-CM | POA: Insufficient documentation

## 2011-10-10 DIAGNOSIS — R443 Hallucinations, unspecified: Secondary | ICD-10-CM | POA: Insufficient documentation

## 2011-10-10 DIAGNOSIS — R109 Unspecified abdominal pain: Secondary | ICD-10-CM | POA: Insufficient documentation

## 2011-10-10 DIAGNOSIS — F329 Major depressive disorder, single episode, unspecified: Secondary | ICD-10-CM | POA: Insufficient documentation

## 2011-10-10 DIAGNOSIS — R45851 Suicidal ideations: Secondary | ICD-10-CM | POA: Insufficient documentation

## 2011-10-10 DIAGNOSIS — F1010 Alcohol abuse, uncomplicated: Secondary | ICD-10-CM | POA: Insufficient documentation

## 2011-10-10 DIAGNOSIS — T43502A Poisoning by unspecified antipsychotics and neuroleptics, intentional self-harm, initial encounter: Secondary | ICD-10-CM | POA: Insufficient documentation

## 2011-10-10 DIAGNOSIS — F10929 Alcohol use, unspecified with intoxication, unspecified: Secondary | ICD-10-CM

## 2011-10-10 DIAGNOSIS — T43501A Poisoning by unspecified antipsychotics and neuroleptics, accidental (unintentional), initial encounter: Secondary | ICD-10-CM | POA: Insufficient documentation

## 2011-10-10 DIAGNOSIS — F17200 Nicotine dependence, unspecified, uncomplicated: Secondary | ICD-10-CM | POA: Insufficient documentation

## 2011-10-10 DIAGNOSIS — I10 Essential (primary) hypertension: Secondary | ICD-10-CM | POA: Insufficient documentation

## 2011-10-10 DIAGNOSIS — R569 Unspecified convulsions: Secondary | ICD-10-CM | POA: Insufficient documentation

## 2011-10-10 DIAGNOSIS — R110 Nausea: Secondary | ICD-10-CM | POA: Insufficient documentation

## 2011-10-10 LAB — COMPREHENSIVE METABOLIC PANEL
ALT: 28 U/L (ref 0–53)
AST: 34 U/L (ref 0–37)
Albumin: 4 g/dL (ref 3.5–5.2)
Alkaline Phosphatase: 64 U/L (ref 39–117)
CO2: 27 mEq/L (ref 19–32)
Chloride: 104 mEq/L (ref 96–112)
GFR calc non Af Amer: >90 mL/min (ref >90)
Potassium: 3.2 mEq/L — ABNORMAL LOW (ref 3.5–5.1)
Total Bilirubin: 0.1 mg/dL — ABNORMAL LOW (ref 0.3–1.2)

## 2011-10-10 LAB — SALICYLATE LEVEL: Salicylate Lvl: <2 mg/dL — ABNORMAL LOW (ref 2.8–20.0)

## 2011-10-10 LAB — CBC
Platelets: 176 10*3/uL (ref 150–400)
RBC: 5.34 MIL/uL (ref 4.22–5.81)
RDW: 14.2 % (ref 11.5–15.5)
WBC: 5.2 10*3/uL (ref 4.0–10.5)

## 2011-10-10 LAB — RAPID URINE DRUG SCREEN, HOSP PERFORMED
Amphetamines: NOT DETECTED
Barbiturates: NOT DETECTED
Opiates: NOT DETECTED
Tetrahydrocannabinol: NOT DETECTED

## 2011-10-10 LAB — ACETAMINOPHEN LEVEL: Acetaminophen (Tylenol), Serum: <15 ug/mL (ref 10–30)

## 2011-10-10 MED ORDER — NICOTINE 21 MG/24HR TD PT24
21.0000 mg | MEDICATED_PATCH | Freq: Every day | TRANSDERMAL | Status: DC
  Administered 2011-10-11 – 2011-10-12 (×2): 21 mg via TRANSDERMAL
  Filled 2011-10-10 (×3): qty 1

## 2011-10-10 MED ORDER — VITAMIN B-1 100 MG PO TABS
100.0000 mg | ORAL_TABLET | Freq: Every day | ORAL | Status: DC
  Administered 2011-10-10 – 2011-10-12 (×3): 100 mg via ORAL
  Filled 2011-10-10 (×3): qty 1

## 2011-10-10 MED ORDER — ATORVASTATIN CALCIUM 40 MG PO TABS
40.0000 mg | ORAL_TABLET | Freq: Every day | ORAL | Status: DC
  Administered 2011-10-11: 40 mg via ORAL
  Filled 2011-10-10 (×3): qty 1

## 2011-10-10 MED ORDER — GABAPENTIN 300 MG PO CAPS
600.0000 mg | ORAL_CAPSULE | Freq: Every day | ORAL | Status: DC
  Administered 2011-10-10 – 2011-10-11 (×2): 600 mg via ORAL
  Filled 2011-10-10 (×3): qty 2

## 2011-10-10 MED ORDER — PHENYTOIN SODIUM EXTENDED 100 MG PO CAPS
200.0000 mg | ORAL_CAPSULE | Freq: Two times a day (BID) | ORAL | Status: DC
  Administered 2011-10-10 – 2011-10-12 (×6): 200 mg via ORAL
  Filled 2011-10-10 (×2): qty 2
  Filled 2011-10-10: qty 1
  Filled 2011-10-10 (×4): qty 2

## 2011-10-10 MED ORDER — METOPROLOL SUCCINATE ER 50 MG PO TB24
50.0000 mg | ORAL_TABLET | Freq: Every day | ORAL | Status: DC
  Administered 2011-10-10 – 2011-10-12 (×3): 50 mg via ORAL
  Filled 2011-10-10 (×3): qty 1

## 2011-10-10 MED ORDER — ONDANSETRON HCL 4 MG PO TABS
4.0000 mg | ORAL_TABLET | Freq: Three times a day (TID) | ORAL | Status: DC | PRN
  Administered 2011-10-10 – 2011-10-11 (×2): 4 mg via ORAL
  Filled 2011-10-10 (×2): qty 1

## 2011-10-10 MED ORDER — LORAZEPAM 1 MG PO TABS: 0.0000 mg | ORAL_TABLET | Freq: Two times a day (BID) | ORAL | Status: DC

## 2011-10-10 MED ORDER — THIAMINE HCL 100 MG/ML IJ SOLN: 100.0000 mg | Freq: Every day | INTRAMUSCULAR | Status: DC

## 2011-10-10 MED ORDER — TRAZODONE HCL 100 MG PO TABS
100.0000 mg | ORAL_TABLET | Freq: Every day | ORAL | Status: DC
  Administered 2011-10-10 (×2): 100 mg via ORAL
  Administered 2011-10-11: 200 mg via ORAL
  Filled 2011-10-10: qty 1
  Filled 2011-10-10: qty 2
  Filled 2011-10-10: qty 1

## 2011-10-10 MED ORDER — IBUPROFEN 600 MG PO TABS
600.0000 mg | ORAL_TABLET | Freq: Three times a day (TID) | ORAL | Status: DC | PRN
  Administered 2011-10-10 (×2): 600 mg via ORAL
  Filled 2011-10-10 (×2): qty 1

## 2011-10-10 MED ORDER — FOLIC ACID 1 MG PO TABS
1.0000 mg | ORAL_TABLET | Freq: Every day | ORAL | Status: DC
  Administered 2011-10-10 – 2011-10-12 (×3): 1 mg via ORAL
  Filled 2011-10-10 (×3): qty 1

## 2011-10-10 MED ORDER — BUSPIRONE HCL 15 MG PO TABS
7.5000 mg | ORAL_TABLET | Freq: Two times a day (BID) | ORAL | Status: DC
  Administered 2011-10-10 – 2011-10-11 (×4): 7.5 mg via ORAL
  Administered 2011-10-11: 22:00:00 via ORAL
  Administered 2011-10-12: 7.5 mg via ORAL
  Filled 2011-10-10 (×7): qty 1

## 2011-10-10 MED ORDER — ADULT MULTIVITAMIN W/MINERALS CH
1.0000 | ORAL_TABLET | Freq: Every day | ORAL | Status: DC
  Administered 2011-10-10 – 2011-10-12 (×3): 1 via ORAL
  Filled 2011-10-10 (×3): qty 1

## 2011-10-10 MED ORDER — AMLODIPINE BESYLATE 10 MG PO TABS
10.0000 mg | ORAL_TABLET | Freq: Every day | ORAL | Status: DC
  Administered 2011-10-10 – 2011-10-12 (×3): 10 mg via ORAL
  Filled 2011-10-10 (×3): qty 1

## 2011-10-10 MED ORDER — LORAZEPAM 1 MG PO TABS: 1.0000 mg | ORAL_TABLET | Freq: Four times a day (QID) | ORAL | Status: DC | PRN

## 2011-10-10 MED ORDER — LORAZEPAM 2 MG/ML IJ SOLN: 1.0000 mg | Freq: Four times a day (QID) | INTRAMUSCULAR | Status: DC | PRN

## 2011-10-10 MED ORDER — LORAZEPAM 1 MG PO TABS
0.0000 mg | ORAL_TABLET | Freq: Four times a day (QID) | ORAL | Status: AC
  Administered 2011-10-10: 1 mg via ORAL
  Administered 2011-10-10 (×2): 2 mg via ORAL
  Administered 2011-10-10 – 2011-10-11 (×2): 1 mg via ORAL
  Filled 2011-10-10 (×2): qty 2
  Filled 2011-10-10 (×3): qty 1

## 2011-10-10 MED ORDER — ARIPIPRAZOLE 10 MG PO TABS
10.0000 mg | ORAL_TABLET | Freq: Every day | ORAL | Status: DC
  Administered 2011-10-10 – 2011-10-12 (×3): 10 mg via ORAL
  Filled 2011-10-10 (×3): qty 1

## 2011-10-10 MED ORDER — GABAPENTIN 300 MG PO CAPS
300.0000 mg | ORAL_CAPSULE | Freq: Two times a day (BID) | ORAL | Status: DC
  Administered 2011-10-10 – 2011-10-12 (×5): 300 mg via ORAL
  Filled 2011-10-10 (×6): qty 1
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
## 2011-10-10 NOTE — ED Provider Notes (Signed)
History     CSN: [id number]  Arrival date & time 10/10/11  0002   First MD Initiated Contact with Patient 10/10/11 0057      Chief Complaint  Patient presents with  . Medical Clearance  . Suicidal    (Consider location/radiation/quality/duration/timing/severity/associated sxs/prior treatment) HPI Comments: Patient presents with suicidal ideation, alcohol intoxication with request for detoxification. States she binge drinks occasionally and has a plan to overdose on his medications. He denies any homicidal ideation or hallucinations. He states he tried to commit suicide earlier tonight by taking 4 of his Abilify 10 mg tablets around 9 PM. He endorses some nausea and epigastric pain. Denies any other ingestions.  The history is provided by the patient.    Past Medical History  Diagnosis Date  . Seizures   . Hypertension   . Depression   . Pancreatitis   . Liver cirrhosis     Past Surgical History  Procedure Date  . Chest surgery   . Left leg surgery     History reviewed. No pertinent family history.  History  Substance Use Topics  . Smoking status: Current Everyday Smoker -- 0.2 packs/day for 20 years    Types: Cigarettes  . Smokeless tobacco: Never Used  . Alcohol Use: Yes     daily      Review of Systems  Constitutional: Positive for activity change. Negative for fever and appetite change.  HENT: Negative for congestion, rhinorrhea and neck pain.   Eyes: Negative for visual disturbance.  Respiratory: Negative for cough, chest tightness and shortness of breath.   Cardiovascular: Negative for chest pain.  Gastrointestinal: Positive for nausea and abdominal pain.  Genitourinary: Negative for dysuria and hematuria.  Musculoskeletal: Negative for back pain.  Neurological: Negative for dizziness, weakness and headaches.  Psychiatric/Behavioral: Positive for suicidal ideas, hallucinations, behavioral problems and self-injury.    Allergies  Morphine and  Penicillins  Home Medications   Current Outpatient Rx  Name Route Sig Dispense Refill  . AMLODIPINE BESYLATE 10 MG PO TABS Oral Take 10 mg by mouth daily.    . ARIPIPRAZOLE 10 MG PO TABS Oral Take 10 mg by mouth daily.    . BUSPIRONE HCL 15 MG PO TABS Oral Take 7.5 mg by mouth 2 (two) times daily.    Marland Kitchen GABAPENTIN 300 MG PO CAPS Oral Take 300-600 mg by mouth 3 (three) times daily. Take 1 capsule by mouth twice daily and then, take 2 capsules by mouth at bedtime    . METOPROLOL SUCCINATE ER 50 MG PO TB24 Oral Take 1 tablet (50 mg total) by mouth daily. 30 tablet 0  . ONDANSETRON HCL 4 MG PO TABS Oral Take 1 tablet (4 mg total) by mouth every 6 (six) hours. 12 tablet 0  . PHENYTOIN SODIUM EXTENDED 100 MG PO CAPS Oral Take 2 capsules (200 mg total) by mouth 2 (two) times daily. 120 capsule 0  . ROSUVASTATIN CALCIUM 40 MG PO TABS Oral Take 40 mg by mouth at bedtime.     . TRAZODONE HCL 100 MG PO TABS Oral Take 100-200 mg by mouth at bedtime.      BP 106/68  Pulse 100  Temp(Src) 97.9 F (36.6 C) (Oral)  Resp 20  SpO2 97%  Physical Exam  Constitutional: He is oriented to person, place, and time. He appears well-developed and well-nourished. No distress.  HENT:  Head: Normocephalic and atraumatic.  Mouth/Throat: Oropharynx is clear and moist. No oropharyngeal exudate.  Eyes: Conjunctivae are normal. Pupils  are equal, round, and reactive to light.  Neck: Normal range of motion. Neck supple.  Cardiovascular: Normal rate, regular rhythm and normal heart sounds.   Pulmonary/Chest: Effort normal and breath sounds normal. No respiratory distress.  Abdominal: Soft. There is no tenderness. There is no rebound and no guarding.  Musculoskeletal: Normal range of motion. He exhibits no edema and no tenderness.  Neurological: He is alert and oriented to person, place, and time. No cranial nerve deficit.  Skin: Skin is warm.    ED Course  Procedures (including critical care time)  Labs Reviewed    COMPREHENSIVE METABOLIC PANEL - Abnormal; Notable for the following:    Potassium 3.2 (*)    BUN 3 (*)    Total Bilirubin 0.1 (*)    All other components within normal limits  ETHANOL - Abnormal; Notable for the following:    Alcohol, Ethyl (B) 337 (*)    All other components within normal limits  SALICYLATE LEVEL - Abnormal; Notable for the following:    Salicylate Lvl <2.0 (*)    All other components within normal limits  PHENYTOIN LEVEL, TOTAL - Abnormal; Notable for the following:    Phenytoin Lvl 4.8 (*)    All other components within normal limits  CBC  URINE RAPID DRUG SCREEN (HOSP PERFORMED)  ACETAMINOPHEN LEVEL   No results found.   1. Suicidal ideation   2. Alcohol intoxication       MDM  Alcohol intoxication, suicidal ideation with suicide attempt. Vital signs stable. Nonfocal exam.  We'll check toxicology labs, EKG, discuss with act team   Date: 10/10/2011  Rate: 94  Rhythm: normal sinus rhythm  QRS Axis: normal  Intervals: normal  ST/T Wave abnormalities: normal  Conduction Disutrbances:none  Narrative Interpretation:   Old EKG Reviewed: none available       Glynn Octave, MD 10/10/11 [phone]

## 2011-10-10 NOTE — ED Notes (Signed)
Pt c/o ETOH intoxication, requesting detox, SI.

## 2011-10-10 NOTE — ED Notes (Signed)
Pt nauseated. C/O abdominal pain. Raynelle Fanning, RN notified. Will cont to monitor.

## 2011-10-10 NOTE — ED Provider Notes (Signed)
BP 122/70  Pulse 98  Temp(Src) 97.9 F (36.6 C) (Oral)  Resp 20  SpO2 97%  Patient seen and evaluated by me. No complaints at this time. Inquiring about breakfast.   Forbes Cellar, MD 10/10/11 1031

## 2011-10-11 MED ORDER — ALUM & MAG HYDROXIDE-SIMETH 200-200-20 MG/5ML PO SUSP
30.0000 mL | Freq: Once | ORAL | Status: AC
Start: 2011-10-11 — End: 2011-10-11
  Administered 2011-10-11: 30 mL via ORAL
  Filled 2011-10-11 (×2): qty 30

## 2011-10-11 MED ORDER — PROMETHAZINE HCL 25 MG/ML IJ SOLN
25.0000 mg | Freq: Once | INTRAMUSCULAR | Status: AC
  Administered 2011-10-11: 25 mg via INTRAMUSCULAR
  Filled 2011-10-11: qty 1

## 2011-10-11 NOTE — ED Provider Notes (Signed)
Assumed care and evaluated.  No complaints or acute events overnight.  Awaiting placement at [facility].  Will await further recs.  Remains medically clear  Dayton Bailiff, MD 10/11/11 [phone]

## 2011-10-11 NOTE — ED Notes (Signed)
Patient brought emesis bag to window twice with vomit. RN notified.

## 2011-10-12 ENCOUNTER — Encounter (HOSPITAL_COMMUNITY): Payer: Self-pay | Admitting: Psychiatry

## 2011-10-12 ENCOUNTER — Inpatient Hospital Stay (HOSPITAL_COMMUNITY)
Admission: AD | Admit: 2011-10-12 | Discharge: 2011-10-21 | DRG: 897 | Disposition: A | Discharge status: Home / Self Care | Payer: PRIVATE HEALTH INSURANCE | Source: Ambulatory Visit | Attending: Psychiatry | Admitting: Psychiatry

## 2011-10-12 DIAGNOSIS — F640 Transsexualism: Secondary | ICD-10-CM

## 2011-10-12 DIAGNOSIS — K089 Disorder of teeth and supporting structures, unspecified: Secondary | ICD-10-CM

## 2011-10-12 DIAGNOSIS — F1910 Other psychoactive substance abuse, uncomplicated: Secondary | ICD-10-CM

## 2011-10-12 DIAGNOSIS — B370 Candidal stomatitis: Secondary | ICD-10-CM

## 2011-10-12 DIAGNOSIS — F603 Borderline personality disorder: Secondary | ICD-10-CM

## 2011-10-12 DIAGNOSIS — F1020 Alcohol dependence, uncomplicated: Principal | ICD-10-CM | POA: Diagnosis present

## 2011-10-12 DIAGNOSIS — R45851 Suicidal ideations: Secondary | ICD-10-CM

## 2011-10-12 DIAGNOSIS — E785 Hyperlipidemia, unspecified: Secondary | ICD-10-CM

## 2011-10-12 DIAGNOSIS — R928 Other abnormal and inconclusive findings on diagnostic imaging of breast: Secondary | ICD-10-CM

## 2011-10-12 DIAGNOSIS — F1010 Alcohol abuse, uncomplicated: Secondary | ICD-10-CM | POA: Diagnosis present

## 2011-10-12 DIAGNOSIS — L0231 Cutaneous abscess of buttock: Secondary | ICD-10-CM

## 2011-10-12 DIAGNOSIS — F410 Panic disorder [episodic paroxysmal anxiety] without agoraphobia: Secondary | ICD-10-CM

## 2011-10-12 DIAGNOSIS — K7460 Unspecified cirrhosis of liver: Secondary | ICD-10-CM

## 2011-10-12 DIAGNOSIS — R634 Abnormal weight loss: Secondary | ICD-10-CM

## 2011-10-12 DIAGNOSIS — Z79899 Other long term (current) drug therapy: Secondary | ICD-10-CM

## 2011-10-12 DIAGNOSIS — F17200 Nicotine dependence, unspecified, uncomplicated: Secondary | ICD-10-CM

## 2011-10-12 DIAGNOSIS — R5381 Other malaise: Secondary | ICD-10-CM

## 2011-10-12 DIAGNOSIS — Z888 Allergy status to other drugs, medicaments and biological substances status: Secondary | ICD-10-CM

## 2011-10-12 DIAGNOSIS — R1013 Epigastric pain: Secondary | ICD-10-CM

## 2011-10-12 DIAGNOSIS — F339 Major depressive disorder, recurrent, unspecified: Secondary | ICD-10-CM | POA: Diagnosis present

## 2011-10-12 DIAGNOSIS — Z880 Allergy status to penicillin: Secondary | ICD-10-CM

## 2011-10-12 DIAGNOSIS — E7800 Pure hypercholesterolemia, unspecified: Secondary | ICD-10-CM

## 2011-10-12 DIAGNOSIS — F1090 Alcohol use, unspecified, uncomplicated: Secondary | ICD-10-CM | POA: Insufficient documentation

## 2011-10-12 DIAGNOSIS — N63 Unspecified lump in unspecified breast: Secondary | ICD-10-CM

## 2011-10-12 DIAGNOSIS — D4959 Neoplasm of unspecified behavior of other genitourinary organ: Secondary | ICD-10-CM

## 2011-10-12 DIAGNOSIS — I10 Essential (primary) hypertension: Secondary | ICD-10-CM

## 2011-10-12 DIAGNOSIS — R569 Unspecified convulsions: Secondary | ICD-10-CM

## 2011-10-12 DIAGNOSIS — R945 Abnormal results of liver function studies: Secondary | ICD-10-CM

## 2011-10-12 DIAGNOSIS — G40909 Epilepsy, unspecified, not intractable, without status epilepticus: Secondary | ICD-10-CM

## 2011-10-12 DIAGNOSIS — F332 Major depressive disorder, recurrent severe without psychotic features: Secondary | ICD-10-CM

## 2011-10-12 DIAGNOSIS — F1021 Alcohol dependence, in remission: Secondary | ICD-10-CM

## 2011-10-12 HISTORY — DX: Atherosclerotic heart disease of native coronary artery without angina pectoris: I25.10

## 2011-10-12 LAB — PHENYTOIN LEVEL, TOTAL: Phenytoin Lvl: 7.9 ug/mL — ABNORMAL LOW (ref 10.0–20.0)

## 2011-10-12 MED ORDER — HYDROCODONE-ACETAMINOPHEN 5-325 MG PO TABS
1.0000 | ORAL_TABLET | Freq: Once | ORAL | Status: AC
  Administered 2011-10-12: 1 via ORAL
  Filled 2011-10-12: qty 1

## 2011-10-12 MED ORDER — CHLORDIAZEPOXIDE HCL 25 MG PO CAPS
25.0000 mg | ORAL_CAPSULE | Freq: Three times a day (TID) | ORAL | Status: AC
  Administered 2011-10-14 (×3): 25 mg via ORAL
  Filled 2011-10-12 (×2): qty 1

## 2011-10-12 MED ORDER — TRAZODONE HCL 100 MG PO TABS
200.0000 mg | ORAL_TABLET | Freq: Every day | ORAL | Status: DC
  Administered 2011-10-12: 200 mg via ORAL
  Filled 2011-10-12 (×4): qty 2

## 2011-10-12 MED ORDER — FOLIC ACID 1 MG PO TABS
1.0000 mg | ORAL_TABLET | Freq: Every day | ORAL | Status: DC
  Administered 2011-10-13 – 2011-10-21 (×9): 1 mg via ORAL
  Filled 2011-10-12 (×11): qty 1

## 2011-10-12 MED ORDER — ADULT MULTIVITAMIN W/MINERALS CH
1.0000 | ORAL_TABLET | Freq: Every day | ORAL | Status: DC
  Filled 2011-10-12 (×3): qty 1

## 2011-10-12 MED ORDER — LOPERAMIDE HCL 2 MG PO CAPS: 2.0000 mg | ORAL_CAPSULE | ORAL | Status: AC | PRN

## 2011-10-12 MED ORDER — ONDANSETRON 4 MG PO TBDP
8.0000 mg | ORAL_TABLET | Freq: Once | ORAL | Status: AC
  Administered 2011-10-12: 8 mg via ORAL
  Filled 2011-10-12: qty 2

## 2011-10-12 MED ORDER — ADULT MULTIVITAMIN W/MINERALS CH
1.0000 | ORAL_TABLET | Freq: Every day | ORAL | Status: DC
  Administered 2011-10-13 – 2011-10-21 (×9): 1 via ORAL
  Filled 2011-10-12 (×11): qty 1

## 2011-10-12 MED ORDER — VITAMIN B-1 100 MG PO TABS
100.0000 mg | ORAL_TABLET | Freq: Every day | ORAL | Status: DC
  Administered 2011-10-13 – 2011-10-21 (×9): 100 mg via ORAL
  Filled 2011-10-12 (×10): qty 1

## 2011-10-12 MED ORDER — GABAPENTIN 300 MG PO CAPS
300.0000 mg | ORAL_CAPSULE | Freq: Two times a day (BID) | ORAL | Status: DC
  Administered 2011-10-12 – 2011-10-13 (×2): 300 mg via ORAL
  Filled 2011-10-12 (×6): qty 1

## 2011-10-12 MED ORDER — CHLORDIAZEPOXIDE HCL 25 MG PO CAPS
25.0000 mg | ORAL_CAPSULE | ORAL | Status: AC
  Administered 2011-10-15 (×2): 25 mg via ORAL
  Filled 2011-10-12 (×2): qty 1

## 2011-10-12 MED ORDER — IBUPROFEN 600 MG PO TABS
600.0000 mg | ORAL_TABLET | Freq: Three times a day (TID) | ORAL | Status: DC | PRN
  Administered 2011-10-14 – 2011-10-20 (×3): 600 mg via ORAL
  Filled 2011-10-12 (×3): qty 1

## 2011-10-12 MED ORDER — MAGNESIUM HYDROXIDE 400 MG/5ML PO SUSP
30.0000 mL | Freq: Every day | ORAL | Status: DC | PRN
  Administered 2011-10-14: 30 mL via ORAL

## 2011-10-12 MED ORDER — PHENYTOIN SODIUM EXTENDED 100 MG PO CAPS
300.0000 mg | ORAL_CAPSULE | Freq: Once | ORAL | Status: AC
  Administered 2011-10-12: 300 mg via ORAL
  Filled 2011-10-12: qty 3

## 2011-10-12 MED ORDER — TRAZODONE HCL 100 MG PO TABS
100.0000 mg | ORAL_TABLET | Freq: Every day | ORAL | Status: DC
  Filled 2011-10-12 (×2): qty 2

## 2011-10-12 MED ORDER — THIAMINE HCL 100 MG/ML IJ SOLN: 100.0000 mg | Freq: Once | INTRAMUSCULAR | Status: DC

## 2011-10-12 MED ORDER — ALUM & MAG HYDROXIDE-SIMETH 200-200-20 MG/5ML PO SUSP
30.0000 mL | Freq: Once | ORAL | Status: AC
  Administered 2011-10-12: 30 mL via ORAL

## 2011-10-12 MED ORDER — CHLORDIAZEPOXIDE HCL 25 MG PO CAPS
25.0000 mg | ORAL_CAPSULE | Freq: Every day | ORAL | Status: AC
  Administered 2011-10-16: 25 mg via ORAL
  Filled 2011-10-12: qty 1

## 2011-10-12 MED ORDER — CHLORDIAZEPOXIDE HCL 25 MG PO CAPS
25.0000 mg | ORAL_CAPSULE | Freq: Four times a day (QID) | ORAL | Status: AC
  Administered 2011-10-12 – 2011-10-13 (×7): 25 mg via ORAL
  Filled 2011-10-12 (×4): qty 1

## 2011-10-12 MED ORDER — ARIPIPRAZOLE 10 MG PO TABS
10.0000 mg | ORAL_TABLET | Freq: Every day | ORAL | Status: DC
  Administered 2011-10-13 – 2011-10-15 (×3): 10 mg via ORAL
  Filled 2011-10-12 (×6): qty 1

## 2011-10-12 MED ORDER — PHENYTOIN SODIUM EXTENDED 100 MG PO CAPS
200.0000 mg | ORAL_CAPSULE | Freq: Two times a day (BID) | ORAL | Status: DC
  Administered 2011-10-12 – 2011-10-19 (×14): 200 mg via ORAL
  Filled 2011-10-12 (×18): qty 2

## 2011-10-12 MED ORDER — ONDANSETRON 4 MG PO TBDP: 4.0000 mg | ORAL_TABLET | Freq: Four times a day (QID) | ORAL | Status: AC | PRN

## 2011-10-12 MED ORDER — CHLORDIAZEPOXIDE HCL 25 MG PO CAPS
25.0000 mg | ORAL_CAPSULE | Freq: Four times a day (QID) | ORAL | Status: AC | PRN
  Administered 2011-10-12: 25 mg via ORAL
  Filled 2011-10-12 (×5): qty 1

## 2011-10-12 MED ORDER — METOPROLOL SUCCINATE ER 50 MG PO TB24
50.0000 mg | ORAL_TABLET | Freq: Every day | ORAL | Status: DC
  Administered 2011-10-13 – 2011-10-21 (×9): 50 mg via ORAL
  Filled 2011-10-12 (×10): qty 1
  Filled 2011-10-12: qty 14
  Filled 2011-10-12: qty 1

## 2011-10-12 MED ORDER — BUSPIRONE HCL 15 MG PO TABS
7.5000 mg | ORAL_TABLET | Freq: Two times a day (BID) | ORAL | Status: DC
  Administered 2011-10-12 – 2011-10-13 (×2): 7.5 mg via ORAL
  Filled 2011-10-12 (×6): qty 1

## 2011-10-12 MED ORDER — AMLODIPINE BESYLATE 10 MG PO TABS
10.0000 mg | ORAL_TABLET | Freq: Every day | ORAL | Status: DC
  Administered 2011-10-13 – 2011-10-21 (×9): 10 mg via ORAL
  Filled 2011-10-12: qty 14
  Filled 2011-10-12 (×11): qty 1

## 2011-10-12 MED ORDER — POTASSIUM CHLORIDE CRYS ER 20 MEQ PO TBCR
40.0000 meq | EXTENDED_RELEASE_TABLET | Freq: Once | ORAL | Status: AC
  Administered 2011-10-12: 40 meq via ORAL
  Filled 2011-10-12: qty 2

## 2011-10-12 MED ORDER — NICOTINE 21 MG/24HR TD PT24
21.0000 mg | MEDICATED_PATCH | Freq: Every day | TRANSDERMAL | Status: DC
  Administered 2011-10-13 – 2011-10-16 (×4): 21 mg via TRANSDERMAL
  Filled 2011-10-12 (×8): qty 1

## 2011-10-12 MED ORDER — ALUM & MAG HYDROXIDE-SIMETH 200-200-20 MG/5ML PO SUSP
30.0000 mL | ORAL | Status: DC | PRN
  Administered 2011-10-16 – 2011-10-21 (×2): 30 mL via ORAL

## 2011-10-12 NOTE — ED Notes (Signed)
Patient comfortable overnight. On exam his abdomen is soft nontender nondistended. His Dilantin level is noted still low. In addition to his scheduled Dilantin is given an extra dose. No seizures in over a month. Patient is still suicidal plan is to send patient over to behavioral health he has bed available.  Sunnie Nielsen, MD 10/12/11 [phone]

## 2011-10-12 NOTE — ED Notes (Signed)
Attempted to call report. Nurse will call back.

## 2011-10-12 NOTE — ED Provider Notes (Signed)
12:40 PM complains of diffuse abdominal pain with nausea and has vomited 3 times today. Patient is presently alert abdomen is nondistended mild diffuse tenderness normoactive bowel sounds. Patient alert Glasgow Coma Score 15 appears comfortable Vicodin and Zofran ordered. Repeat Dilantin level pending   Doug Sou, MD 10/12/11 [phone]
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
## 2011-10-12 NOTE — Tx Team (Signed)
Initial Interdisciplinary Treatment Plan  PATIENT STRENGTHS: (choose at least two) Ability for insight Average or above average intelligence Capable of independent living Communication skills  PATIENT STRESSORS: Financial difficulties Loss of partner*   PROBLEM LIST: Problem List/Patient Goals Date to be addressed Date deferred Reason deferred Estimated date of resolution  Depression                                                       DISCHARGE CRITERIA:  Ability to meet basic life and health needs Adequate post-discharge living arrangements Improved stabilization in mood, thinking, and/or behavior Need for constant or close observation no longer present Reduction of life-threatening or endangering symptoms to within safe limits  PRELIMINARY DISCHARGE PLAN: Outpatient therapy  PATIENT/FAMIILY INVOLVEMENT: This treatment plan has been presented to and reviewed with the patient, [name], and/or family member.  The patient and family have been given the opportunity to ask questions and make suggestions.  Dyke Maes 10/12/2011, 1:28 PM

## 2011-10-12 NOTE — ED Notes (Signed)
Meal tray given 
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
## 2011-10-13 DIAGNOSIS — F339 Major depressive disorder, recurrent, unspecified: Secondary | ICD-10-CM | POA: Diagnosis present

## 2011-10-13 DIAGNOSIS — F1090 Alcohol use, unspecified, uncomplicated: Secondary | ICD-10-CM | POA: Insufficient documentation

## 2011-10-13 DIAGNOSIS — F410 Panic disorder [episodic paroxysmal anxiety] without agoraphobia: Secondary | ICD-10-CM

## 2011-10-13 DIAGNOSIS — F1010 Alcohol abuse, uncomplicated: Secondary | ICD-10-CM | POA: Diagnosis present

## 2011-10-13 LAB — HEPATITIS PANEL, ACUTE
Hep A IgM: NEGATIVE
Hep B C IgM: NEGATIVE

## 2011-10-13 LAB — RPR: RPR Ser Ql: NONREACTIVE

## 2011-10-13 LAB — BASIC METABOLIC PANEL
BUN: 6 mg/dL (ref 6–23)
Calcium: 9.1 mg/dL (ref 8.4–10.5)
GFR calc non Af Amer: >90 mL/min (ref >90)
Glucose, Bld: 85 mg/dL (ref 70–99)
Sodium: 138 mEq/L (ref 135–145)

## 2011-10-13 LAB — T4, FREE: Free T4: 0.98 ng/dL (ref 0.80–1.80)

## 2011-10-13 MED ORDER — BUSPIRONE HCL 10 MG PO TABS
10.0000 mg | ORAL_TABLET | Freq: Two times a day (BID) | ORAL | Status: DC
  Administered 2011-10-13 – 2011-10-15 (×4): 10 mg via ORAL
  Filled 2011-10-13 (×8): qty 1

## 2011-10-13 MED ORDER — SERTRALINE HCL 50 MG PO TABS
50.0000 mg | ORAL_TABLET | Freq: Every day | ORAL | Status: DC
  Administered 2011-10-13 – 2011-10-21 (×9): 50 mg via ORAL
  Filled 2011-10-13: qty 1
  Filled 2011-10-13: qty 14
  Filled 2011-10-13 (×10): qty 1

## 2011-10-13 MED ORDER — GABAPENTIN 400 MG PO CAPS
400.0000 mg | ORAL_CAPSULE | ORAL | Status: DC
  Administered 2011-10-13 – 2011-10-15 (×7): 400 mg via ORAL
  Filled 2011-10-13 (×13): qty 1

## 2011-10-13 MED ORDER — TUBERCULIN PPD 5 UNIT/0.1ML ID SOLN
5.0000 [IU] | Freq: Once | INTRADERMAL | Status: AC
  Administered 2011-10-13: 5 [IU] via INTRADERMAL

## 2011-10-13 MED ORDER — ATORVASTATIN CALCIUM 80 MG PO TABS
80.0000 mg | ORAL_TABLET | Freq: Every day | ORAL | Status: DC
  Administered 2011-10-13 – 2011-10-20 (×8): 80 mg via ORAL
  Filled 2011-10-13 (×9): qty 1

## 2011-10-13 MED ORDER — TRAZODONE HCL 50 MG PO TABS
300.0000 mg | ORAL_TABLET | Freq: Every day | ORAL | Status: DC
  Administered 2011-10-13 – 2011-10-18 (×6): 300 mg via ORAL
  Filled 2011-10-13 (×8): qty 6

## 2011-10-13 MED ORDER — ATORVASTATIN CALCIUM 80 MG PO TABS: 80.0000 mg | ORAL_TABLET | Freq: Every day | ORAL | Status: DC
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
## 2011-10-13 NOTE — Progress Notes (Signed)
Patient ID: [name], male   DOB: 03/28/1981, 31 y.o.   MRN: [id number] Pt is awake and active on the unit this PM. Pt is attending groups and is cooperative with staff. Pt denies SI/HI and AVH. Pt mood is depressed and he c/o hopelessness, and said that his sponsor is old and hard to reach. Writer offered emotional support and encouraged pt to find another sponsor and to make as many contacts in AA as possible. Pt stated that he never thought of having several people in his support network and he would do so. Pt also hopes to attend a long term tx facility. Pt lab results were negative and he was also relieved to hear this information. PPD placed on left forearm. Writer will continue to monitor.

## 2011-10-13 NOTE — Progress Notes (Signed)
Adult Psychosocial Assessment Update Interdisciplinary Team  Previous Behavior Health Hospital admissions/discharges:  Admissions Discharges  Date: 11/06/2010 Date: 11/09/2010  Date: Date:  Date: Date:  Date: Date:  Date: Date:   Changes since the last Psychosocial Assessment (including adherence to outpatient mental health and/or substance abuse treatment, situational issues contributing to decompensation and/or relapse). Patient reports he relapsed into drinking, because within the last 3 months he has lost  His best friend of 15 years who died in a car accident, he lost his job, and lost his   Apartment. Patient states he tried to overdose on pills and alcohol. Patient reports he   Took about 4-5 Trazadone in a suicide attempt. Patient states he has drank alcohol for  15 years.     Discharge Plan 1. Will you be returning to the same living situation after discharge?   Yes:  No:  X    If no, what is your plan?    Patient states he would prefer to not live with his parents because they enable his  Unhealthy drinking habits.     2. Would you like a referral for services when you are discharged? Yes:  X   If yes, for what services?  No:       Patient reports he is interested in receiving follow-up treatment services at the [facility]. If this treatment is possible, patient states he would need to be d/c on  10/14/2011.   Summary and Recommendations (to be completed by the evaluator) Patient is a 31 year old male. Patient admitted with diagnosis of Mood Disorder NOS.   Patient reports he has endured significant lost in his life and these events have caused   him to relapse into drinking alcohol. Patient would benefit from crisis stabilization,  medication evaluation, psycho ed and group therapy, and case management for  Discharge planning               Signature:  Wilmon Arms, 10/13/2011 3:20 PM

## 2011-10-13 NOTE — Discharge Planning (Signed)
Pt reports a prior admission to [facility] a year ago for alcohol detox. Pt contributes current admission to SI and relapsing on alcohol 3 months ago after a 1 year of sobriety. Pt denies any current SI and contributes his year of sobriety to working, going to school, and going to [employer] Pt states that his relapse occurred as a way of dealing with the death of a friend, job loss, and apartment loss. Pt feels that his meds may not be effective and would like to discuss different options with the doctor. Pt is currently being seen at [facility] for meds and therapy. Pt states that he has been living with parents since the loss of his apartment but would not like to return to their home after d/c because he feels that they enable him by providing him with money to continue to drink alcohol. Pt feels that a more structured environment outside of [place] would be the most helpful in terms of maintaining his sobriety. Pt states that his pastor was able to get a bed for him at [facility] and feels that it would be a good fit for him because it is a structured environment. Was planning on calling his PO today to find out about [place].

## 2011-10-13 NOTE — Progress Notes (Addendum)
Pt has been up for groups and has been interacting with peers and staff appropriately thus far.  He rated both his depression and hopelessness a 6 and his anxiety a 8 on his self-inventory.  He denied any S/H ideations or A/V hallucinations.  He denies any symptoms of withdrawal other than anxiety and sweating "a little"  His CIWA was a 3 at noon today.  He really has not made any decision thus far about any long-term treatment but did state, "I might have a place at [facility] but I would have to leave tomorrow"  Encouraged him to talk with his doctor and case manager.  He voiced understanding.

## 2011-10-13 NOTE — Progress Notes (Signed)
Pt is new to the unit today.  Overdosed on Abilify after break-up with his boyfriend.  Pt has been pleasant/cooperative since coming to the unit.  He was placed on Librium protocol d/t his alcohol use.  He denies any withdrawal symptoms.  He denies any self harm thoughts at this time.  Encouraged pt to make needs known to staff.  Safety maintained with q15 minute checks.

## 2011-10-13 NOTE — Progress Notes (Signed)
[facility] Group Notes:  (Counselor/Nursing/MHT/Case Management/Adjunct)  10/13/2011 1:00 PM  Type of Therapy:  Group Therapy  Participation Level:  Minimal  Participation Quality:  Appropriate and Attentive  Affect:  Appropriate  Cognitive:  Alert and Appropriate  Insight:  Limited  Engagement in Group:  Limited  Engagement in Therapy:  Limited  Modes of Intervention:  Clarification, Education, Support and Exploration  Summary of Progress/Problems: Patient reports he feels "exhausted physically and emotionally". Patient states he does not set high enough goals for himself. He states he often has the feeling of "whatever happens, happens". Patient seemed to relate well to peers in group discussion of emotion regulation.   Wilmon Arms 10/13/2011, 1:00 PM

## 2011-10-13 NOTE — Progress Notes (Signed)
[facility] Group Notes:  (Counselor/Nursing/MHT/Case Management/Adjunct)  10/13/2011 2:56 PM  Type of Therapy:  1:15PM Group Therapy  Participation Level:  Active  Participation Quality:  Appropriate and Attentive  Affect:  Appropriate  Cognitive:  Alert and Appropriate  Insight:  Good  Engagement in Group:  Good  Engagement in Therapy:  Good  Modes of Intervention:  Activity, Clarification, Education, Support and Exploration  Summary of Progress/Problems: Patient related fear to "not knowing". When asked to elaborate, patient stated he often has fear of not knowing what the outcome of a situation will be. Patient stated failure can cause certain stigmas. Patient stated he has experienced failure in "getting knocked down over and over". Patient reported the first time he felt abandonment was with his father. He stated the first time he met his father was in a casket. Patient then stated he had "no concern for others when he was using, but when he became sober he had to relive the feelings of shame and guilt".     [name] 10/13/2011, 2:56 PM

## 2011-10-14 NOTE — Progress Notes (Signed)
10/14/2011         Time: 1415      Group Topic/Focus: The focus of the group is on enhancing the patients' ability to cope with stressors by understanding what coping is, why it is important, the negative effects of stress and developing healthier coping skills. Patients asked to complete a fifteen minute plan, outlining three triggers, three supports, and fifteen coping activities.  Participation Level: Active  Participation Quality: Appropriate and Attentive  Affect: Blunted  Cognitive: Oriented   Additional Comments: None.   Kaleb Sek 10/14/2011 3:54 PM

## 2011-10-14 NOTE — Progress Notes (Signed)
Patient ID: [name], male   DOB: 01/26/1981, 31 y.o.   MRN: [id number] Pt is awake and active on the unit this PM. Pt is participating in groups and is cooperative with staff. Pt denies SI/HI and AVH. Pt states that he has been depressed today after being rejected for placement due to his hx of seizures. However, the pt stated that he will not let it get him down and that he will continue to move forward because its the only good option available to him. Writer offered emotional support. Writer will continue to monitor.

## 2011-10-14 NOTE — Progress Notes (Signed)
Cosigned by Avrum Kimball C Yana Schorr, LCSWA 3/28/20133:55 PM   

## 2011-10-14 NOTE — Progress Notes (Signed)
[facility] Family/Significant Other Suicide Prevention Education  Suicide Prevention Education:  Education Completed; [name] at [phone] (Mother) has been identified by the patient as the family member/significant other with whom the patient will be residing, and identified as the person(s) who will aid the patient in the event of a mental health crisis (suicidal ideations/suicide attempt).  With written consent from the patient, the family member/significant other has been provided the following suicide prevention education, prior to the and/or following the discharge of the patient.  The suicide prevention education provided includes the following:  Suicide risk factors  Suicide prevention and interventions  National Suicide Hotline telephone number  [facility] assessment telephone number  [facility] Emergency Assistance 911  [facility] and/or Residential Mobile Crisis Unit telephone number  Request made of family/significant other to:  Remove weapons (e.g., guns, rifles, knives), all items previously/currently identified as safety concern.    Remove drugs/medications (over-the-counter, prescriptions, illicit drugs), all items previously/currently identified as a safety concern.  Ms [name] states there are no firearms nor narcotics in home  The family member/significant other verbalizes understanding of the suicide prevention education information provided.  The family member/significant other agrees to remove the items of safety concern listed above.  [name] 10/14/2011, 3:56 PM

## 2011-10-14 NOTE — Progress Notes (Signed)
[facility] Group Notes:  (Counselor/Nursing/MHT/Case Management/Adjunct)  10/14/2011 3:07 PM  Type of Therapy:  1:15PM Group Therapy  Participation Level:  Minimal  Participation Quality:  Attentive and Drowsy  Affect:  Flat  Cognitive:  Appropriate  Insight:  Good  Engagement in Group:  Limited  Engagement in Therapy:  Limited  Modes of Intervention:  Activity, Clarification, Education, Support and Exploration  Summary of Progress/Problems: In discussing life and balance, patient chose a picture of a wire cage, because it represented his life when it was balanced. Patient stated "although you're trying to get over a wall/fence you still may get cut/hurt along the way". Patient seemed to relate well to his peers in discussing life and balance. Although, patient presented limited participation in group discussion, he seemed to have pretty good insight.   Wilmon Arms 10/14/2011, 3:07 PM

## 2011-10-14 NOTE — Treatment Plan (Signed)
Interdisciplinary Treatment Plan Update (Adult)  Date: 10/14/2011  Time Reviewed: 8:22 AM   Progress in Treatment: Attending groups: Yes Participating in groups: Yes Taking medication as prescribed: Yes Tolerating medication: Yes   Family/Significant othe contact made: Counselor to contact mother  Patient understands diagnosis:  Yes As evidenced by asking for help with substance abuse Discussing patient identified problems/goals with staff:  Yes  See below Medical problems stabilized or resolved:  Yes Denies suicidal/homicidal ideation: Yes  Yes, in tx team Issues/concerns per patient self-inventory:  Yes  Asking for sleep meds,  Depression 8, hopelessness 8 Other:  New problem(s) identified: N/A  Reason for Continuation of Hospitalization: Medication stabilization Withdrawal symptoms  Interventions implemented related to continuation of hospitalization:   Additional comments:  Estimated length of stay: 2-3 days  Discharge Plan: Try to get into rehab  New goal(s): N/A  Review of initial/current patient goals per problem list:   1.  Goal(s): Safely detox from alcohol  Met:  No  Target date:3/30  As evidenced by: Decrease in CIWA to 0, stable vitals  2.  Goal (s): Identify safe place to live   Met:  No  Target date:3/30  As evidenced by: Secure a bed  3.  Goal(s):  Met:  No  Target date:  As evidenced by:  4.  Goal(s):  Met:  No  Target date:  As evidenced by:  Attendees: Patient:  [name] 10/14/2011 8:22 AM  Family:     Physician:  Lupe Carney 10/14/2011 8:22 AM   Nursing: [place]well Miners   10/14/2011 8:22 AM   Case Manager:  Richelle Ito, LCSW 10/14/2011 8:22 AM   Counselor:  Ronda Fairly, LCSWA 10/14/2011 8:22 AM   Other:  Verne Spurr 10/14/2011 8:22 AM  Other:     Other:     Other:      Scribe for Treatment Team:   Ida Rogue, 10/14/2011 8:22 AM

## 2011-10-14 NOTE — Progress Notes (Signed)
[name] is bright and appropriate this shift. Desires long term treatment and is in search of placement.  Denies SI.  No overt s/s of withdrawal but does endorse anxiety. No physical complaints. Compliant with scheduled medications.  12 step concepts reenforced.  Support offered and 15' checks cont for safety.

## 2011-10-14 NOTE — Discharge Planning (Signed)
Pt present in morning group. Good mood, good participation. Pt is currently homeless and seeking possible long term treatment residency programs to provide shelter and structure to deal with his alcohol addiction. Pt had interview with [facility] for possible placement but was denied due to seizure disorder. [facility] is another option for pt but it is questionable whether or not pt will have to transfer his probation from [employer]. Pt was given additional resources as possible placements and is actively exploring different options.

## 2011-10-14 NOTE — Progress Notes (Signed)
Cosigned by Carney Bern, LCSWA 3/28/20133:55 PM

## 2011-10-14 NOTE — Progress Notes (Signed)
[facility] Group Notes:  (Counselor/Nursing/MHT/Case Management/Adjunct)  10/14/2011 1:04 PM  Type of Therapy:  Group Therapy  Participation Level:  Minimal  Participation Quality:  Attentive  Affect:  Appropriate  Cognitive:  Appropriate  Insight:  Limited  Engagement in Group:  Limited  Engagement in Therapy:  Limited  Modes of Intervention:  Clarification, Education, Support and Exploration  Summary of Progress/Problems: Patient stated in group that he feels as if he does not get enough respect in life. Patient presented limited participation in group discussion. Patient also left group early.   [name] 10/14/2011, 1:04 PM

## 2011-10-15 MED ORDER — ARIPIPRAZOLE 5 MG PO TABS
5.0000 mg | ORAL_TABLET | Freq: Every day | ORAL | Status: DC
  Administered 2011-10-16 – 2011-10-19 (×4): 5 mg via ORAL
  Filled 2011-10-15 (×6): qty 1

## 2011-10-15 MED ORDER — DIVALPROEX SODIUM ER 500 MG PO TB24
500.0000 mg | ORAL_TABLET | ORAL | Status: DC
  Administered 2011-10-15 – 2011-10-21 (×12): 500 mg via ORAL
  Filled 2011-10-15 (×14): qty 1
  Filled 2011-10-15: qty 28
  Filled 2011-10-15: qty 1
  Filled 2011-10-15: qty 28

## 2011-10-15 MED ORDER — BUSPIRONE HCL 5 MG PO TABS
5.0000 mg | ORAL_TABLET | Freq: Two times a day (BID) | ORAL | Status: DC
  Administered 2011-10-15 – 2011-10-19 (×8): 5 mg via ORAL
  Filled 2011-10-15 (×12): qty 1

## 2011-10-15 MED ORDER — GABAPENTIN 100 MG PO CAPS
200.0000 mg | ORAL_CAPSULE | ORAL | Status: DC
  Administered 2011-10-15 – 2011-10-19 (×13): 200 mg via ORAL
  Filled 2011-10-15 (×18): qty 2

## 2011-10-15 NOTE — Progress Notes (Addendum)
[facility] MD Progress Note  10/15/2011 1:00 PM  Diagnosis:I: Alcohol Dependence - Ongoing Usage.  Major Depressive Disorder - Recurrent.     ADL's:  Intact  Sleep: Good  Appetite:  Good  Suicidal Ideation:  denies Homicidal Ideation:  denies  Subjective: [name] sates he has a phone interview for a possible program at 1PM today and he is anxious about not being able to continue his medication.  States he is much improved since his arrival here and is optimistic about the future.  Mental Status Examination/Evaluation: Objective:  Appearance: Fairly Groomed  Eye Contact::  Good  Speech:  Clear and Coherent  Volume:  Normal  Mood:  Euthymic  Depression he rates as a 3-4/10 much improved.  Affect:  Congruent  Thought Process:  Goal Directed  Orientation:  Full  Thought Content:  WDL  Suicidal Thoughts:  No  Homicidal Thoughts:  No  Memory:  Immediate;   Good  Judgement:  Intact  Insight:  Present  Psychomotor Activity:  Normal  Concentration:  Good  Recall:  Good  Akathisia:  No  Handed:    AIMS (if indicated):     Assets:  Communication Skills Desire for Improvement  Sleep:  Number of Hours: 5.5    Vital Signs:Blood pressure 117/70, pulse 90, temperature 97.5 F (36.4 C), temperature source Oral, resp. rate 16, height 5' 4.5" (1.638 m), weight 76.204 kg (168 lb), SpO2 99.00%. Current Medications: Current Facility-Administered Medications  Medication Dose Route Frequency Provider Last Rate Last Dose  . alum & mag hydroxide-simeth (MAALOX/MYLANTA) 200-200-20 MG/5ML suspension 30 mL  30 mL Oral Q4H PRN Viviann Spare, NP      . amLODipine (NORVASC) tablet 10 mg  10 mg Oral Daily Curlene Labrum Readling, MD   10 mg at 10/15/11 0814  . ARIPiprazole (ABILIFY) tablet 10 mg  10 mg Oral Daily Curlene Labrum Readling, MD   10 mg at 10/15/11 0814  . atorvastatin (LIPITOR) tablet 80 mg  80 mg Oral QHS Curlene Labrum Readling, MD   80 mg at 10/14/11 2130  . busPIRone (BUSPAR) tablet 10 mg  10 mg Oral BID  Ronny Bacon, MD   10 mg at 10/15/11 0814  . chlordiazePOXIDE (LIBRIUM) capsule 25 mg  25 mg Oral Q6H PRN Viviann Spare, NP   25 mg at 10/12/11 2336  . chlordiazePOXIDE (LIBRIUM) capsule 25 mg  25 mg Oral TID Curlene Labrum Readling, MD   25 mg at 10/14/11 1627   Followed by  . chlordiazePOXIDE (LIBRIUM) capsule 25 mg  25 mg Oral BH-qamhs Randy D Readling, MD   25 mg at 10/15/11 0813   Followed by  . chlordiazePOXIDE (LIBRIUM) capsule 25 mg  25 mg Oral Daily Curlene Labrum Readling, MD      . folic acid (FOLVITE) tablet 1 mg  1 mg Oral Daily Curlene Labrum Readling, MD   1 mg at 10/15/11 0814  . gabapentin (NEURONTIN) capsule 400 mg  400 mg Oral [id number] Curlene Labrum Readling, MD   400 mg at 10/15/11 0814  . ibuprofen (ADVIL,MOTRIN) tablet 600 mg  600 mg Oral Q8H PRN Viviann Spare, NP   600 mg at 10/14/11 1209  . loperamide (IMODIUM) capsule 2-4 mg  2-4 mg Oral PRN Viviann Spare, NP      . magnesium hydroxide (MILK OF MAGNESIA) suspension 30 mL  30 mL Oral Daily PRN Viviann Spare, NP   30 mL at 10/14/11 2147  . metoprolol succinate (TOPROL-XL) 24  hr tablet 50 mg  50 mg Oral Daily Curlene Labrum Readling, MD   50 mg at 10/15/11 0814  . mulitivitamin with minerals tablet 1 tablet  1 tablet Oral Daily Curlene Labrum Readling, MD   1 tablet at 10/15/11 0814  . nicotine (NICODERM CQ - dosed in mg/24 hours) patch 21 mg  21 mg Transdermal Daily Curlene Labrum Readling, MD   21 mg at 10/15/11 0814  . ondansetron (ZOFRAN-ODT) disintegrating tablet 4 mg  4 mg Oral Q6H PRN Viviann Spare, NP      . phenytoin (DILANTIN) ER capsule 200 mg  200 mg Oral BID Curlene Labrum Readling, MD   200 mg at 10/15/11 0814  . sertraline (ZOLOFT) tablet 50 mg  50 mg Oral Daily Curlene Labrum Readling, MD   50 mg at 10/15/11 0814  . thiamine (B-1) injection 100 mg  100 mg Intramuscular Once Viviann Spare, NP      . thiamine (VITAMIN B-1) tablet 100 mg  100 mg Oral Daily Curlene Labrum Readling, MD   100 mg at 10/15/11 0814  . traZODone (DESYREL) tablet 300 mg  300 mg  Oral QHS Curlene Labrum Readling, MD   300 mg at 10/14/11 2130    Lab Results: No results found for this or any previous visit (from the past 48 hour(s)).  Physical Findings: AIMS:  , ,  ,  ,    CIWA:  CIWA-Ar Total: 0  COWS:     Treatment Plan Summary: Daily contact with patient to assess and evaluate symptoms and progress in treatment Medication management  Plan:  [name] could be discharged over the weekend if he finds a program that will allow him to continue his medication as it is currently prescribed.  He is doing well.  He is also told to have any concerns regarding his continued use of medication directed to the provider so any questions could be answered regarding the use of medication.  [name] 10/15/2011, 1:00 PM

## 2011-10-15 NOTE — Progress Notes (Addendum)
S/O: Pt seen and evaluated in treatment team.  Reviewed short term and long term goals, medications, current treatment in the hospital and acute/chronic safety.  Pt denied any current AVH, shadows, thoughts of self harm, suicidal ideation or homicidal ideation.  Said his mood was "fine". Sleep "wonderful". Contracted for safety on the unit.  No acute issues noted.  VS reviewed with team.  Denied cravings.  Pt agreeable with treatment plan, see orders. Continue current medications as noted below:      Meds:    . amLODipine  10 mg Oral Daily  . ARIPiprazole  10 mg Oral Daily  . atorvastatin  80 mg Oral QHS  . busPIRone  10 mg Oral BID  . chlordiazePOXIDE  25 mg Oral TID   Followed by  . chlordiazePOXIDE  25 mg Oral BH-qamhs   Followed by  . chlordiazePOXIDE  25 mg Oral Daily  . folic acid  1 mg Oral Daily  . gabapentin  400 mg Oral [id number]  . metoprolol succinate  50 mg Oral Daily  . mulitivitamin with minerals  1 tablet Oral Daily  . nicotine  21 mg Transdermal Daily  . phenytoin  200 mg Oral BID  . sertraline  50 mg Oral Daily  . thiamine  100 mg Intramuscular Once  . thiamine  100 mg Oral Daily  . traZODone  300 mg Oral QHS    A/P: Alcohol Dependence and W/D; Major Depressive Disorder, per Hx; r/o SIMD; Reading and Writing LD; Seizure Disorder   1. Will continue current medications.  2. Laboratory Studies reviewed.  3. Will continue to monitor.  4. Will continue the Librium Detox Protocol for alcohol detox since the patient's blood alcohol was 337 at admission with continued report of w/d s/s. 5. Team working with the patient in regards to discharge planning. Needs to contact PO.

## 2011-10-15 NOTE — Progress Notes (Signed)
Patient came to medication window and received his hs medications. Patient has been in bed asleep previously before writer awakened him. Patient inquired if the doctor had made any changes to his medications because of the treatment facility he was attempting to get in did not allow certain medications. Patient was informed that his Dr. is aware and had mentioned in his note the plan for changes to his medications. Patient was pleased and voiced no other concerns. Patient currently denies having pain, -si/hi/a/v hall. Safety maintained on unit, will continue to monitor.

## 2011-10-15 NOTE — Progress Notes (Signed)
Patient ID: [name], male   DOB: 1981/06/04, 31 y.o.   MRN: [id number]  S:  This provider was approached by the patient's Case Manager who states that the patient would very much like to enter the [facility] which is a 2 year program offering services which include job training, housing and psychiatric support.  However, the facility is very restrictive regarding what medications the patient may take while at this facility.  The patient may not take Trazodone, Abilify, Dilantin, Buspar or Neurontin but can take Zoloft, Depakote, Celexa, and Zyprexa.  The patient is requesting medication changes to allow [facility].  P:   1. Will start Depakote ER 500 mgs po q am and hs for seizure control with plans to taper and discontinue the medication Dilantin after the Depakote becomes therapeutic.  2. Will decrease the medication Buspar to 5 mgs po q am and hs with plans to discontinue this medication.  3. Will decrease the medication Neurontin 200 mgs po q am, 2 pm and hs with plans to discontinue this medication.  4. Will eventually also need to taper and discontinue the medication Trazodone.  The patient has requested the above changes in medications in order to allow him to be able to enter the [facility].  The patient understands that it is unusual to change medications which are working well but is willing to take the chance of decompensation as the new medications are being adjusted in order to qualify for the [facility].  Please see PA-C note today for further details of the patient's condition.

## 2011-10-15 NOTE — Progress Notes (Signed)
Currently resting quietly in bed in right lateral position with eyes closed. Respirations are even and unlabored. No acute distress noted. Safety has been maintained with Q15 minute observation. Will continue current POC.  

## 2011-10-15 NOTE — Progress Notes (Signed)
[facility] Group Notes:  (Counselor/Nursing/MHT/Case Management/Adjunct)  10/15/2011 10:33 AM   Type of Therapy:  Processing Group at 11:00 am  Participation Level:  Appropriate  Participation Quality:   Appropriate  Affect:   Appropriate  Cognitive:   Appropriate  Insight:   Appropriate  Engagement in Group:  Good   Engagement in Therapy:  Good  Modes of Intervention:  Education, support and clarification.   Summary of Progress/Problems: [name] was attentive to presentation on Post Acute Withdrawal Syndrome and shared his experience "this is when things usually fall apart for me but no one has ever addressed it like this." [name] took part by reading two paragraphs on 'How to survive PAWS', shared his immediate delima of where to go at discharge and talked about long term goal of returning to school.  [facility] Group Notes:  (Counselor/Nursing/MHT/Case Management/Adjunct)  10/15/2011 10:33 AM   Type of Therapy:  Counseling Group at 1:15 pm  Participation Level:  Active  Participation Quality:  Appropriate  Affect:  Appropriate  Cognitive:  Appropriate  Insight:  Improved  Engagement in Group:  Good  Engagement in Therapy:  Good  Modes of Intervention:  Exploration and support  Summary of Progress/Problems:  [name] shared his concerns about changing medication in order to be on the medication accepted by discharge treatment facility. Others in group offered support and encouragement in patient was able to make a decision to get medication change a try and trust process. [name] shared this is a new way of taking care of himself "looking for the gray area vs seeing everything is black or white"  Ronda Fairly, LCSWA 10/15/2011 10:33 AM

## 2011-10-15 NOTE — Discharge Planning (Addendum)
[name] interviewed with [facility] today.  Went well. He is feeling optimistic that he will be able to transfer there on Monday as the Dr has agreed to change him to meds that they accept on their formulary.  Please note that it is [facility] that he plans to go to, not [facility] as outlined in Dr [employer] note of today.

## 2011-10-15 NOTE — Progress Notes (Signed)
Pt bright and appropriate this shift.  Is out in milieu interacting with peers and attending groups.  Proactive with d/c plans.  Rates depression and hopelessness at 7 but affect is incongruent with stated mood.  No physical complaints and no prn's requested.  Ongoing support offered and 15' checks cont for safety.

## 2011-10-16 NOTE — Progress Notes (Signed)
  [name] is a 31 y.o. male [id number] [birth year]  10/12/2011 Principal Problem:  *Alcohol abuse Active Problems:  Major depressive disorder, recurrent   Mental Status: Alert & oriented mood -is a little anxious - wants to go to [facility] at [facility]. Denies SI/HI/AVH no evidence for withdrawal.   Subjective/Objective: Very appropriately asks for his TB and HIV test results to be faxed to the [facility]. Supplies the fax and contact -his nurse faxed after lunch.    Filed Vitals:   10/16/11 0701  BP: 124/84  Pulse: 97  Temp:   Resp: 18    Lab Results:   BMET    Component Value Date/Time   NA 138 10/13/2011 0615   K 3.9 10/13/2011 0615   CL 100 10/13/2011 0615   CO2 32 10/13/2011 0615   GLUCOSE 85 10/13/2011 0615   BUN 6 10/13/2011 0615   CREATININE 0.87 10/13/2011 0615   CALCIUM 9.1 10/13/2011 0615   GFRNONAA >90 10/13/2011 0615   GFRAA >90 10/13/2011 0615    Medications:  Scheduled:     . amLODipine  10 mg Oral Daily  . ARIPiprazole  5 mg Oral Daily  . atorvastatin  80 mg Oral QHS  . busPIRone  5 mg Oral BID  . chlordiazePOXIDE  25 mg Oral BH-qamhs   Followed by  . chlordiazePOXIDE  25 mg Oral Daily  . divalproex  500 mg Oral BH-qamhs  . folic acid  1 mg Oral Daily  . gabapentin  200 mg Oral BH-q8a2phs  . metoprolol succinate  50 mg Oral Daily  . mulitivitamin with minerals  1 tablet Oral Daily  . nicotine  21 mg Transdermal Daily  . phenytoin  200 mg Oral BID  . sertraline  50 mg Oral Daily  . thiamine  100 mg Intramuscular Once  . thiamine  100 mg Oral Daily  . traZODone  300 mg Oral QHS  . DISCONTD: ARIPiprazole  10 mg Oral Daily  . DISCONTD: busPIRone  10 mg Oral BID  . DISCONTD: gabapentin  400 mg Oral BH-q8a2phs     PRN Meds alum & mag hydroxide-simeth, ibuprofen, magnesium hydroxide Plan: continue with current plan of care and discharge to [facility] if accepted.   Lashawna Poche,MICKIE D. 10/16/2011

## 2011-10-16 NOTE — Progress Notes (Signed)
Writer observed patient lying in bed asleep and was easily aroused when his name was called. Patient c/o having a headache in which he reports that he has has off and on all day. Patient seems to think that his cholesterol is high being the reason that he is having a headache. Patient reported that his parents visited him today and informed him that his grandmother died earlier this morning. Patient was supported and encouraged. He informed his parents of his plans and they were supportive. Patient denies si/hi/a/v hall. Safety maintained on unit, will continue to monitor.

## 2011-10-16 NOTE — Progress Notes (Signed)
[name] complained of mild anxiety this shift.  Support and diversion given and encouraged group participation which he did.  Rates depression and hopelessness at 4. Denies SI. Continues to be proactive in long term treatment placement.PPD read and labs reviewed. Ongoing support offered and 15' checks cont for safety.

## 2011-10-16 NOTE — Progress Notes (Signed)
Patient ID: [name], male   DOB: 10-05-1980, 31 y.o.   MRN: [id number]  [facility] Group Notes:  (Counselor/Nursing/MHT/Case Management/Adjunct)  10/16/2011 1:15 PM  Type of Therapy:  Group Therapy, Dance/Movement Therapy   Participation Level:  None  Participation Quality:  Drowsy  Affect:  Depressed  Cognitive:  Appropriate  Insight:  None  Engagement in Group:  None  Engagement in Therapy:  None  Modes of Intervention:  Clarification, Problem-solving, Role-play, Socialization and Support  Summary of Progress/Problems: Therapist discussed with group about forgiveness and breaking patterns. Pt made little eye contact with peers and therapist but did not share personal experiences with the group.       Rhunette Croft

## 2011-10-17 DIAGNOSIS — F1020 Alcohol dependence, uncomplicated: Secondary | ICD-10-CM | POA: Diagnosis present

## 2011-10-17 NOTE — Progress Notes (Signed)
  [name] is a 31 y.o. male [id number] [birth year]  10/12/2011 Principal Problem:  *Alcohol dependence syndrome Active Problems:  Major depressive disorder, recurrent  Alcohol abuse   Mental Status: Alert & oriented. Mood is excited happily anticipates acceptance to [facility].  Denies SI/HI/AVH. Has finished detox without significant symptoms.     Subjective/Objective: Happy -feels very confident about acceptabce to program tomorrow in [name]. Shared with him that stadff have been bragging that he is the most motivated patient they can remember.     Filed Vitals:   10/17/11 0619  BP: 116/77  Pulse: 87  Temp:   Resp:     Lab Results:   BMET    Component Value Date/Time   NA 138 10/13/2011 0615   K 3.9 10/13/2011 0615   CL 100 10/13/2011 0615   CO2 32 10/13/2011 0615   GLUCOSE 85 10/13/2011 0615   BUN 6 10/13/2011 0615   CREATININE 0.87 10/13/2011 0615   CALCIUM 9.1 10/13/2011 0615   GFRNONAA >90 10/13/2011 0615   GFRAA >90 10/13/2011 0615    Medications:  Scheduled:     . amLODipine  10 mg Oral Daily  . ARIPiprazole  5 mg Oral Daily  . atorvastatin  80 mg Oral QHS  . busPIRone  5 mg Oral BID  . divalproex  500 mg Oral BH-qamhs  . folic acid  1 mg Oral Daily  . gabapentin  200 mg Oral BH-q8a2phs  . metoprolol succinate  50 mg Oral Daily  . mulitivitamin with minerals  1 tablet Oral Daily  . nicotine  21 mg Transdermal Daily  . phenytoin  200 mg Oral BID  . sertraline  50 mg Oral Daily  . thiamine  100 mg Intramuscular Once  . thiamine  100 mg Oral Daily  . traZODone  300 mg Oral QHS     PRN Meds alum & mag hydroxide-simeth, ibuprofen, magnesium hydroxide Plan continue with current plan of care          Test results were faxed as requested yesterday    Sinan Tuch,MICKIE D. 10/17/2011

## 2011-10-17 NOTE — Progress Notes (Signed)
Patient ID: [name], male   DOB: 05/03/1981, 31 y.o.   MRN: [id number] Patient took medications this evening without incident. Patient states that he was told that his medications would be changed if he was suppose to have further treatment in [place], [place]. Patient encouraged to bring up subject to doctor and social worker tomorrow. Patient verbalized understandment and agreement. Patient polite and appropriate this evening. Verbalizes no other concerns or complaints. Patient denies SI/HI/AV. No pain verbalized this shift. Patient requested nicotine patch be discontinued as he has not received this for the past two mornings. Continue monitoring patient q38minutes to ensure patient safety and continue plan of care. Patient remains safe while on the unit.

## 2011-10-17 NOTE — Progress Notes (Signed)
Pt pleasant and cooperative this shift. Very focused on recovery and is anticipating successful long term treatment.  Rates depression and hopelessness at 5. Denies SI.  No s/s of withdrawal.  Active group participation.  Support offered and 15' checks cont for safety.

## 2011-10-17 NOTE — Progress Notes (Signed)
Pt. attended and participated in aftercare planning group. Pt. stated that they already have been given suicide prevention, warning signs to look for with suicide and crisis line numbers to use. The pt. agreed to call crisis line numbers if having warning signs or having thoughts of suicide.   Pt. listed their current anxiety level as a "6". Therapist lead an excerise in getting body tight and tense then slowly relaxing. Pt smiled afterworlds and was able to take several deep breaths showing some calmness.  Pt also spoke about wanting to go to [facility] and waiting for his medications to be "stable".

## 2011-10-17 NOTE — Progress Notes (Signed)
[facility] Group Notes:  (Counselor/Nursing/MHT/Case Management/Adjunct)  10/17/2011 1:15 PM  Type of Therapy:  Group Therapy, Dance/Movement Therapy   Participation Level:  Active  Participation Quality:  Appropriate, sharing, suportive  Affect:  Appropriate and Depressed  Cognitive:  Oriented  Insight:  Good  Engagement in Group:  Limited  Engagement in Therapy:  Good  Modes of Intervention:  Clarification, Problem-solving, Role-play, Socialization and Support  Summary of Progress/Problems: Pt. began group by talking about what memories the Easter holiday brought up, "drinking". Pt spoke about supports and how he wished he had more true supports. Pt actively discussed what makes a positive healthy support and how to determine if people are true supports of not.  Pt stated that he wants professional supports and hope to be in [facility] next year and celebrating one year of sobriety.  Gevena Mart

## 2011-10-17 NOTE — Progress Notes (Signed)
Report received from N. Pettine RN, Clinical research associate observed patient lying in bed asleep eyes closed, resp. even and unlabored, no signs of distress noted, safety maintained on unit, will continue to monitor.

## 2011-10-18 NOTE — Progress Notes (Signed)
Patient ID: [name], male   DOB: [birth year], 31 y.o.   MRN: [id number]

## 2011-10-18 NOTE — Progress Notes (Signed)
Pt has been up and has been active while in milieu, has been attending and participating in various milieu activities, pt spoke about going to long term treatment and that the process is still on-going, pt has received medications without incident and has not verbalized any complaints, will continue to monitor

## 2011-10-19 DIAGNOSIS — F1020 Alcohol dependence, uncomplicated: Principal | ICD-10-CM

## 2011-10-19 DIAGNOSIS — F332 Major depressive disorder, recurrent severe without psychotic features: Secondary | ICD-10-CM

## 2011-10-19 MED ORDER — TRAZODONE HCL 150 MG PO TABS
150.0000 mg | ORAL_TABLET | Freq: Every day | ORAL | Status: DC
  Administered 2011-10-19 – 2011-10-20 (×2): 150 mg via ORAL
  Filled 2011-10-19 (×3): qty 1

## 2011-10-19 MED ORDER — OLANZAPINE 5 MG PO TABS
5.0000 mg | ORAL_TABLET | Freq: Every day | ORAL | Status: DC
  Administered 2011-10-19 – 2011-10-20 (×2): 5 mg via ORAL
  Filled 2011-10-19 (×3): qty 1
  Filled 2011-10-19: qty 14
  Filled 2011-10-19: qty 1

## 2011-10-19 MED ORDER — GABAPENTIN 100 MG PO CAPS
100.0000 mg | ORAL_CAPSULE | ORAL | Status: DC
  Administered 2011-10-20 – 2011-10-21 (×4): 100 mg via ORAL
  Filled 2011-10-19 (×7): qty 1

## 2011-10-19 MED ORDER — TRAZODONE HCL 100 MG PO TABS
200.0000 mg | ORAL_TABLET | Freq: Every day | ORAL | Status: DC
  Filled 2011-10-19: qty 2

## 2011-10-19 NOTE — Progress Notes (Signed)
Pt. Pleasant and cooperative.  Pt. Did not go to recreational therapy group.  Pt. Focused on being discharged.  Support given.

## 2011-10-19 NOTE — Progress Notes (Signed)
  10/19/2011         Time: 1415      Group Topic/Focus: The focus of this group is on discussing various styles of communication and communicating assertively using 'I' (feeling) statements.  Participation Level: Active  Participation Quality: IRedirectable  Affect: Labile  Cognitive: Oriented   Additional Comments: Patient required some redirection for joining in with peers who were making inappropriate jokes.   Devlynn Knoff 10/19/2011 4:00 PM

## 2011-10-19 NOTE — Progress Notes (Signed)
[facility] MD Progress Note  10/18/2011  3:59 PM  Diagnosis:  MDD, Alcohol dependence, Anxiety disorder  ADL's:  Intact  Sleep: Fair  Appetite:  Good  Suicidal Ideation:  Denies Homicidal Ideation:  denies  Pt. Is waiting to get into [facility] program and has not heard back.  He has been very proactive in getting this placement.  We are to make medication adjustments based on what they will allow.  Mental Status Examination/Evaluation: Objective:  Appearance: Casual  Eye Contact::  Good  Speech:  Clear and Coherent  Volume:  Normal  Mood:  Euthymic  Affect:  Appropriate  Thought Process:  Intact  Orientation:  Full  Thought Content:  WDL  Suicidal Thoughts:  No  Homicidal Thoughts:  No  Memory:  Immediate;   Good  Judgement:  Intact  Insight:  Present  Psychomotor Activity:  Normal  Concentration:  Fair  Recall:  Fair  Akathisia:  No  Handed:    AIMS (if indicated):     Assets:  Communication Skills Desire for Improvement Physical Health  Sleep:  Number of Hours: 6    Vital Signs:Blood pressure 123/85, pulse 81, temperature 97.9 F (36.6 C), temperature source Oral, resp. rate 16, height 5' 4.5" (1.638 m), weight 76.204 kg (168 lb), SpO2 99.00%. Current Medications: Current Facility-Administered Medications  Medication Dose Route Frequency Provider Last Rate Last Dose  . alum & mag hydroxide-simeth (MAALOX/MYLANTA) 200-200-20 MG/5ML suspension 30 mL  30 mL Oral Q4H PRN Viviann Spare, NP   30 mL at 10/16/11 0043  . amLODipine (NORVASC) tablet 10 mg  10 mg Oral Daily Curlene Labrum Readling, MD   10 mg at 10/19/11 0834  . atorvastatin (LIPITOR) tablet 80 mg  80 mg Oral QHS Curlene Labrum Readling, MD   80 mg at 10/18/11 2155  . divalproex (DEPAKOTE ER) 24 hr tablet 500 mg  500 mg Oral BH-qamhs Curlene Labrum Readling, MD   500 mg at 10/19/11 0834  . folic acid (FOLVITE) tablet 1 mg  1 mg Oral Daily Curlene Labrum Readling, MD   1 mg at 10/19/11 0834  . gabapentin (NEURONTIN) capsule 200  mg  200 mg Oral BH-q8a2phs Curlene Labrum Readling, MD   200 mg at 10/19/11 1403  . ibuprofen (ADVIL,MOTRIN) tablet 600 mg  600 mg Oral Q8H PRN Viviann Spare, NP   600 mg at 10/16/11 1955  . magnesium hydroxide (MILK OF MAGNESIA) suspension 30 mL  30 mL Oral Daily PRN Viviann Spare, NP   30 mL at 10/14/11 2147  . metoprolol succinate (TOPROL-XL) 24 hr tablet 50 mg  50 mg Oral Daily Curlene Labrum Readling, MD   50 mg at 10/19/11 0834  . mulitivitamin with minerals tablet 1 tablet  1 tablet Oral Daily Ronny Bacon, MD   1 tablet at 10/19/11 0834  . OLANZapine (ZYPREXA) tablet 5 mg  5 mg Oral QHS Verne Spurr, PA-C      . sertraline (ZOLOFT) tablet 50 mg  50 mg Oral Daily Curlene Labrum Readling, MD   50 mg at 10/19/11 0834  . thiamine (B-1) injection 100 mg  100 mg Intramuscular Once Viviann Spare, NP      . thiamine (VITAMIN B-1) tablet 100 mg  100 mg Oral Daily Curlene Labrum Readling, MD   100 mg at 10/19/11 0834  . traZODone (DESYREL) tablet 200 mg  200 mg Oral QHS Verne Spurr, PA-C      . DISCONTD: ARIPiprazole (ABILIFY) tablet 5 mg  5 mg Oral Daily Curlene Labrum Readling, MD   5 mg at 10/19/11 0834  . DISCONTD: busPIRone (BUSPAR) tablet 5 mg  5 mg Oral BID Curlene Labrum Readling, MD   5 mg at 10/19/11 0834  . DISCONTD: phenytoin (DILANTIN) ER capsule 200 mg  200 mg Oral BID Curlene Labrum Readling, MD   200 mg at 10/19/11 0833  . DISCONTD: traZODone (DESYREL) tablet 300 mg  300 mg Oral QHS Curlene Labrum Readling, MD   300 mg at 10/18/11 2155    Lab Results: No results found for this or any previous visit (from the past 48 hour(s)).  Physical Findings: AIMS:  , ,  ,  ,    CIWA:  CIWA-Ar Total: 0  COWS:     Treatment Plan Summary: We will wait to see if Keller actually got accepted into treatment before changing medications.  It seems more prudent to proceed this way than changing meds to accommodate an admission that is not yet confirmed.  Plan:  Phyliss Hulick 10/19/2011, 3:59 PM

## 2011-10-19 NOTE — Progress Notes (Signed)
Patient ID: [name], male   DOB: 07/05/1981, 31 y.o.   MRN: [id number] Has been up and about and to group.  He says that he is feeling a little better and wants to go to another treatment  Place and that he has been making calls. Depression 4, hopeless at 4, denies S/I thoughts and no c/o of withdrawals.           Marland Kitchen

## 2011-10-19 NOTE — Progress Notes (Signed)
[facility] Group Notes:  (Counselor/Nursing/MHT/Case Management/Adjunct)  10/19/2011 5:47 PM   Type of Therapy:  Processing Group at 11:00 am  Participation Level:  Minimal  Participation Quality:  Drowsy  Affect:  Drowsy  Cognitive:  Oriented  Insight:  None shared  Engagement in Group:  None  Modes of Intervention:  Exploration  Summary of Progress/Problems:  [name] was unable to participate in group due to drowsiness.  [facility] Group Notes:  (Counselor/Nursing/MHT/Case Management/Adjunct)  10/19/2011 5:47 PM   Type of Therapy:  Counseling Group at 1:15 pm  Participation Level:  Active  Participation Quality:  Attentive and sharing   Affect:  appropriate  Cognitive  appropriate   Insight:   good   Engagement in Group:   good   Engagement in Therapy:   good   Modes of Intervention:   exploration clarification socialization and support   Summary of Progress/Problems:  [name] related well to the stages of grieving in relationship to his feelings about diagnosis. "I often forget to get into that bargaining stage, or maybe it's just denial."    Ronda Fairly, LCSWA 10/19/2011 5:47 PM

## 2011-10-19 NOTE — Progress Notes (Signed)
Pt observed in his room in bed reading.  He reports his day has been fine.  He states he has an application pending to [facility], a rehab in [place].  He says he made the application on Friday and hasn't heard back from them.  Reminded pt that this was a holiday weekend and it may be Tuesday before he hears anything.   Pt denies any withdrawal symptoms at this time.  He voices no complaints.  He denies SI/HI at this time.  Safety maintained with q15 minute checks.

## 2011-10-19 NOTE — Discharge Planning (Signed)
Today in AM group Nero indicated that he has not yet been fully switched to meds that he needs to be off of and on for acceptance into program.  I followed up with Dr Allena Katz to remind him of this needed adjustment.  We are targeting d/c date of 4/4.

## 2011-10-19 NOTE — Progress Notes (Signed)
[facility] Group Notes:  (Counselor/Nursing/MHT/Case Management/Adjunct)  10/19/2011 4:12 PM  Type of Therapy:  Group Therapy at 11:00 on Monday 4.1.13  Participation Level:  Active  Participation Quality:  Attentive and Sharing  Affect:  Appropriate  Cognitive:  Appropriate  Insight:  Good  Engagement in Group:  Good  Engagement in Therapy:  Good  Modes of Intervention:  Clarification, Socialization and Support  Summary of Progress/Problems:  Slater shared that one of his biggest obstacles in past to recovery has been "this false sense that things will be different this time"  Patient shared he has been hospitalized 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 and Drs have told me that I can no longer use alcohol safely in any manner, yet I continue to do so."   [name] 10/19/2011, 4:17 PM  [facility] Group Notes:  (Counselor/Nursing/MHT/Case Management/Adjunct)  10/20/2011 4:17 PM  Type of Therapy:  Group Therapy  Participation Level:  Minimal  Participation Quality:  Attentive  Affect:  Appropriate  Cognitive:  Appropriate  Insight:  Good  Engagement in Group:  Limited  Modes of Intervention:  Education and Socialization  Summary of Progress/Problems:  Volunteer did not show from [facility] thus Clinical research associate provided information and education regarding supports and programs available at [facility].  Alp was attentive to entire group and shared his own experience of going there in previous years along with comment "Perhaps I need to return, because it was really good."   [name] 10/20/2011, 4:17 PM

## 2011-10-20 MED ORDER — METOPROLOL SUCCINATE ER 50 MG PO TB24: 50.0000 mg | ORAL_TABLET | Freq: Every day | ORAL | Status: DC

## 2011-10-20 MED ORDER — AMLODIPINE BESYLATE 10 MG PO TABS: 10.0000 mg | ORAL_TABLET | Freq: Every day | ORAL | Status: DC

## 2011-10-20 MED ORDER — SERTRALINE HCL 50 MG PO TABS: 50.0000 mg | ORAL_TABLET | Freq: Every day | ORAL | Status: DC

## 2011-10-20 MED ORDER — GABAPENTIN 300 MG PO CAPS: 300.0000 mg | ORAL_CAPSULE | Freq: Three times a day (TID) | ORAL | Status: DC

## 2011-10-20 MED ORDER — DIVALPROEX SODIUM ER 500 MG PO TB24: 500.0000 mg | ORAL_TABLET | ORAL | Status: DC

## 2011-10-20 MED ORDER — OLANZAPINE 5 MG PO TABS: 5.0000 mg | ORAL_TABLET | Freq: Every day | ORAL | Status: DC

## 2011-10-20 NOTE — Progress Notes (Signed)
Pt resting in bed with eyes closed.  No distress observed.  Safety maintained with q15 minute checks. 

## 2011-10-20 NOTE — Progress Notes (Signed)
[facility] Group Notes:  (Counselor/Nursing/MHT/Case Management/Adjunct)  10/20/2011 4:21 PM  Type of Therapy:  1:15PM Group Therapy  Participation Level:  None  Participation Quality:  Drowsy  Affect:  Unable to Assess  Cognitive:  Unable to Assess  Insight:  None  Engagement in Group:  None  Engagement in Therapy:  None  Modes of Intervention:  Support and Exploration  Summary of Progress/Problems: Patient asleep during group therapy session.   [name] 10/20/2011, 4:21 PM

## 2011-10-20 NOTE — Discharge Planning (Signed)
Pt present in morning d/c planning group. Current plan is for pt to d/c tomorrow and go to [facility] program in [place] on Friday. A list of medications pt cannot take while being at the program was reviewed and changes were made to pt's meds to combate this issue. Pt will attend funeral for his grandmother tomorrow and stay with parents overnight who will take him to [facility] program on Friday.  [name] is now waffling on [employer] [facility]; stating that he will no be able to afford meds while there, and something about [employer] and criminal background check required from state of [place].  If he decides not to go to [facility], back-up plan is to go home with parents and then to [facility] when they have an opening next week.

## 2011-10-20 NOTE — Progress Notes (Signed)
[facility] Group Notes:  (Counselor/Nursing/MHT/Case Management/Adjunct)  10/20/2011    Type of Therapy:  Processing Group at 11:00 am  Participation Level:  None  Participation Quality:  Asleep    [facility] Group Notes:  (Counselor/Nursing/MHT/Case Management/Adjunct)  10/20/2011 8:50 PM   Type of Therapy:  Counseling Group at 1:15 pm  Participation Level:  Minimal  Participation Quality:  Attended group yet drowsy and nodding off to sleep    Ronda Fairly, LCSWA 10/20/2011 10:33 AM

## 2011-10-20 NOTE — Progress Notes (Signed)
[facility] Case Management Discharge Plan:  Will you be returning to the same living situation after discharge: Yes,  home temporarily At discharge, do you have transportation home?:Yes,  parents Do you have the ability to pay for your medications:Yes,  parents, mental health  Interagency Information:     Release of information consent forms completed and in the chart;  [name] signature needed at discharge.  Patient to Follow up at:  Follow-up Information    Follow up with [facility] on 11/02/2011. (11:30 am bring identification, proof of income, and proof of residence to appointment)    Contact information:   [address]  [place], [place] [address] Phone: [phone]   Fax: [phone]      Follow up with [facility]. (If you are still in town next week, walk in at 8AM any day to be opened for services there [medications primarily])    Contact information:   [address]  [place] [phone]      Follow up with [facility]. (You know how to contact Trey Paula if this becomes an option for you)          Patient denies SI/HI:   Yes,  yes    Safety Planning and Suicide Prevention discussed:  Yes,  yes  Barrier to discharge identified:No  Summary and Recommendations:   Ida Rogue 10/20/2011, 4:02 PM

## 2011-10-20 NOTE — Progress Notes (Signed)
Patient ID: [name], male   DOB: 06-18-1981, 31 y.o.   MRN: [id number] He has been up and about and to groups. He is smiling more and appears to be more at ease. Self inventory: depression 4, hopelessness 4, denies SI thoughts.

## 2011-10-20 NOTE — Progress Notes (Signed)
Pt observed in his room reading in bed.  He reports he is feeling better today.  He states he is being discharged to his parents home Dec 27, 2022 as his grandmother died and the funeral is tomorrow.  The facility in [facility] did not work out and he is going to [facility] next week when a bed opens up.  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  He denies any SI/HI.  He voices no concerns at this time.  Safety maintained with q15 minute checks.

## 2011-10-20 NOTE — Discharge Summary (Signed)
Physician Discharge Summary Note  Patient:  [name] is an 31 y.o., male MRN:  [id number] DOB:  [birth year] Patient phone:  [phone] (home)  Patient address:   [address] [place] [address],   Date of Admission:  10/12/2011 Date of Discharge: 10/20/2011  Reason for Admission: detox  Discharge Diagnoses: Principal Problem:  *Alcohol dependence syndrome Active Problems:  Major depressive disorder, recurrent  Alcohol abuse   Axis Diagnosis:  AXIS I: Major Depressive Disorder, severe recurrent AXIS II: Deferred  AXIS III:  Past Medical History   Diagnosis  Date   .  Seizures    .  Hypertension    .  Depression    .  Pancreatitis    .  Liver cirrhosis    .  Coronary artery disease    AXIS IV: economic problems, occupational problems and other psychosocial or environmental problems  AXIS V: 51-60 moderate symptoms  Level of Care: Out patient  Hospital Course:         Was admitted for detox from alcohol, and crisis management.  He/she was treated with the standard Librium/Clonodin protocol.  Medical problems were identified and treated.  Home medication was restarted as appropriate.     Improvement was monitored by CIWA scores and patient's daily report of withdrawal symptom reduction. Emotional and mental status was monitored by daily self inventory reports completed by the patient and clinical staff.      The patient was evaluated by the treatment team for stability and plans for continued recovery upon discharge. He/She was offered further treatment options upon discharge including Residential, IOP, and Outpatient treatment.  The patient's motivation was an integral factor for scheduling further treatment.  Employment, transportation, bed availability, health status, family support, and any pending legal issues were also considered. Medication was changed to accommodate a possible treatment facility, however, [facility] was not accepted.  Due to his great  grandmother's death he requested discharge home and made plans to follow up for possible admission to [facility] when an available bed was open.    Upon completion of detox the patient was both mentally and medically stable for discharge.    Consults: none  Significant Diagnostic Studies:  none  Discharge Vitals:   Blood pressure 105/74, pulse 69, temperature 97.6 F (36.4 C), temperature source Oral, resp. rate 20, height 5' 4.5" (1.638 m), weight 76.204 kg (168 lb), SpO2 99.00%.  Mental Status Exam: See Mental Status Examination and Suicide Risk Assessment completed by Attending Physician prior to discharge.  Discharge destination:  Home then to [facility]  Is patient on multiple antipsychotic therapies at discharge:  No   Has Patient had three or more failed trials of antipsychotic monotherapy by history:  No Recommended Plan for Multiple Antipsychotic Therapies: not applicable  Discharge Orders    Future Appointments: Provider: Department: Dept Phone: Center:   11/30/2011 1:30 PM [facility] [phone] [facility]   11/30/2011 2:00 PM [facility] [phone] [facility]   11/30/2011 2:30 PM Larina Earthly, MD [facility] [phone] [facility]     Future Orders Please Complete By Expires   Diet - low sodium heart healthy      Increase activity slowly      Discharge instructions      Comments:   Keep all appointments as scheduled.  Take all medications as prescribed.     Medication List  As of 10/20/2011 10:58 PM   STOP taking these medications  ARIPiprazole 10 MG tablet      busPIRone 15 MG tablet      ondansetron 4 MG tablet      phenytoin 100 MG ER capsule      traZODone 100 MG tablet         TAKE these medications      Indication    amLODipine 10 MG tablet   Commonly known as: NORVASC   Take 1 tablet (10 mg total) by mouth daily.    Indication: High Blood Pressure      divalproex 500 MG 24 hr tablet   Commonly known as: DEPAKOTE ER   Take 1  tablet (500 mg total) by mouth 2 (two) times daily in the am and at bedtime..    Indication: mood stabilization      gabapentin 300 MG capsule   Commonly known as: NEURONTIN   Take 1-2 capsules (300-600 mg total) by mouth 3 (three) times daily. Take 1 capsule by mouth twice daily and then, take 2 capsules by mouth at bedtime    Indication: Neuropathic Pain      metoprolol succinate 50 MG 24 hr tablet   Commonly known as: TOPROL-XL   Take 1 tablet (50 mg total) by mouth daily.    Indication: Feeling Anxious, High Blood Pressure      OLANZapine 5 MG tablet   Commonly known as: ZYPREXA   Take 1 tablet (5 mg total) by mouth at bedtime.    Indication: Manic-Depression, Trouble Sleeping      rosuvastatin 40 MG tablet   Commonly known as: CRESTOR   Take 40 mg by mouth at bedtime.       sertraline 50 MG tablet   Commonly known as: ZOLOFT   Take 1 tablet (50 mg total) by mouth daily.    Indication: Anxiety Disorder, Social Anxiety Disorder           Follow-up Information    Follow up with [facility] on 11/02/2011. (11:30 am bring identification, proof of income, and proof of residence to appointment)    Contact information:   [address]  [name], [place] [address] Phone: [phone]   Fax: [phone]      Follow up with [facility]. (If you are still in town next week, walk in at 8AM any day to be opened for services there [medications primarily])    Contact information:   [address]  [place] [phone]      Follow up with [facility]. (You know how to contact Trey Paula if this becomes an option for you)          Follow-up recommendations: See above  Comments:  none  Signed: Suzy Kugel 10/20/2011, 10:58 PM
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
## 2011-10-21 MED ORDER — GABAPENTIN 600 MG PO TABS
300.0000 mg | ORAL_TABLET | Freq: Two times a day (BID) | ORAL | Status: DC
  Filled 2011-10-21: qty 0.5

## 2011-10-21 MED ORDER — ROSUVASTATIN CALCIUM 40 MG PO TABS
40.0000 mg | ORAL_TABLET | Freq: Every day | ORAL | Status: DC
  Filled 2011-10-21: qty 14

## 2011-10-21 NOTE — Tx Team (Signed)
Interdisciplinary Treatment Plan Update (Adult)  Date:  10/21/2011  Time Reviewed:  10:20 AM   Progress in Treatment: Attending groups: Yes Participating in groups:  Yes Taking medication as prescribed: Yes Tolerating medication:  Yes Family/Significant othe contact made:   Patient understands diagnosis:  Yes Discussing patient identified problems/goals with staff:  Yes Medical problems stabilized or resolved:  Yes Denies suicidal/homicidal ideation: Yes Issues/concerns per patient self-inventory:  None identified Other: N/A  New problem(s) identified: None Identified  Reason for Continuation of Hospitalization: Stable to d/c  Interventions implemented related to continuation of hospitalization: Stable to d/c  Additional comments: N/A  Estimated length of stay: D/C today  Discharge Plan: Pt was d/c home and will follow up with [facility] or [facility]): N/A  Review of initial/current patient goals per problem list:    1.  Goal(s): Address substance use  Met:  Yes  Target date: by discharge  As evidenced by: completed detox protocol and referred to appropriate treatment  2.  Goal (s): Reduce depressive and anxiety symptoms  Met:  Yes  Target date: by discharge  As evidenced by: Reducing depression from a 10 to a 3 as reported by pt.  Pt denies having depression and anxiety.   3.  Goal(s): Eliminate SI  Met:  Yes  Target date: by discharge  As evidenced by: Pt denies SI   Attendees: Patient:     Family:     Physician:   10/21/2011 10:20 AM   Nursing: Izola Price, RN 10/21/2011 10:20 AM   Case Manager:  Reyes Ivan, LCSWA 10/21/2011  10:20 AM   Counselor:  Ronda Fairly, LCSWA 10/21/2011  10:20 AM   Other:  Tanya Nones, SW intern 10/21/2011 10:20 AM   Other:  Verne Spurr, PA .10/21/2011 10:23 AM   Other:     Other:      Scribe for Treatment Team:   Reyes Ivan 10/21/2011 10:20 AM

## 2011-10-21 NOTE — Progress Notes (Signed)
Patient ID: [name], male   DOB: 1981/04/22, 31 y.o.   MRN: [id number] He has been up and about today interacting with peers and staff. No c/o any discomfort.  Discharged at this time, picked up by a brother. [name] voiced understanding of discharge instructions and of follow up.  All belongings taken home with him.

## 2011-10-21 NOTE — Progress Notes (Signed)
Cosigned by Carney Bern, LCSWA 4/4/201310:02 AM

## 2011-10-22 NOTE — Progress Notes (Signed)
Patient Discharge Instructions:  Psychiatric Admission Assessment Note Provided,  10/22/2011 After Visit Summary (AVS) Provided,  10/22/2011 Face Sheet Provided, 10/22/2011 Faxed/Sent to the Next [facility] Care provider:  10/22/2011 Provided Suicide Risk Assessment - Discharge Assessment 10/22/2011  Faxed to [facility] @ [phone]  Wandra Scot, 10/22/2011, 4:32 PM

## 2011-11-12 ENCOUNTER — Inpatient Hospital Stay (HOSPITAL_COMMUNITY)
Admission: EM | Admit: 2011-11-12 | Discharge: 2011-11-16 | DRG: 897 | Disposition: A | Discharge status: Home / Self Care | Payer: Self-pay | Attending: Internal Medicine | Admitting: Internal Medicine

## 2011-11-12 DIAGNOSIS — F10929 Alcohol use, unspecified with intoxication, unspecified: Secondary | ICD-10-CM | POA: Diagnosis present

## 2011-11-12 DIAGNOSIS — R569 Unspecified convulsions: Secondary | ICD-10-CM | POA: Diagnosis present

## 2011-11-12 DIAGNOSIS — T438X2A Poisoning by other psychotropic drugs, intentional self-harm, initial encounter: Secondary | ICD-10-CM | POA: Diagnosis present

## 2011-11-12 DIAGNOSIS — T43502A Poisoning by unspecified antipsychotics and neuroleptics, intentional self-harm, initial encounter: Secondary | ICD-10-CM | POA: Diagnosis present

## 2011-11-12 DIAGNOSIS — E876 Hypokalemia: Secondary | ICD-10-CM | POA: Diagnosis present

## 2011-11-12 DIAGNOSIS — T43294A Poisoning by other antidepressants, undetermined, initial encounter: Secondary | ICD-10-CM | POA: Diagnosis present

## 2011-11-12 DIAGNOSIS — F1010 Alcohol abuse, uncomplicated: Principal | ICD-10-CM | POA: Diagnosis present

## 2011-11-12 DIAGNOSIS — R45851 Suicidal ideations: Secondary | ICD-10-CM

## 2011-11-12 DIAGNOSIS — F1410 Cocaine abuse, uncomplicated: Secondary | ICD-10-CM | POA: Diagnosis present

## 2011-11-12 DIAGNOSIS — R7989 Other specified abnormal findings of blood chemistry: Secondary | ICD-10-CM | POA: Diagnosis present

## 2011-11-12 DIAGNOSIS — G40909 Epilepsy, unspecified, not intractable, without status epilepticus: Secondary | ICD-10-CM | POA: Diagnosis present

## 2011-11-12 DIAGNOSIS — E7800 Pure hypercholesterolemia, unspecified: Secondary | ICD-10-CM | POA: Diagnosis present

## 2011-11-12 DIAGNOSIS — F1910 Other psychoactive substance abuse, uncomplicated: Secondary | ICD-10-CM | POA: Diagnosis present

## 2011-11-12 DIAGNOSIS — I2510 Atherosclerotic heart disease of native coronary artery without angina pectoris: Secondary | ICD-10-CM | POA: Diagnosis present

## 2011-11-12 DIAGNOSIS — R000 Tachycardia, unspecified: Secondary | ICD-10-CM | POA: Diagnosis present

## 2011-11-12 DIAGNOSIS — F339 Major depressive disorder, recurrent, unspecified: Secondary | ICD-10-CM | POA: Diagnosis present

## 2011-11-12 DIAGNOSIS — Z87891 Personal history of nicotine dependence: Secondary | ICD-10-CM

## 2011-11-12 DIAGNOSIS — R945 Abnormal results of liver function studies: Secondary | ICD-10-CM

## 2011-11-12 DIAGNOSIS — I10 Essential (primary) hypertension: Secondary | ICD-10-CM | POA: Diagnosis present

## 2011-11-12 DIAGNOSIS — T50902A Poisoning by unspecified drugs, medicaments and biological substances, intentional self-harm, initial encounter: Secondary | ICD-10-CM

## 2011-11-12 HISTORY — DX: Headache: R51

## 2011-11-12 HISTORY — DX: Angina pectoris, unspecified: I20.9

## 2011-11-12 HISTORY — DX: Shortness of breath: R06.02

## 2011-11-12 LAB — SALICYLATE LEVEL: Salicylate Lvl: <2 mg/dL — ABNORMAL LOW (ref 2.8–20.0)

## 2011-11-12 LAB — RAPID URINE DRUG SCREEN, HOSP PERFORMED
Barbiturates: NOT DETECTED
Tetrahydrocannabinol: NOT DETECTED

## 2011-11-12 LAB — URINE MICROSCOPIC-ADD ON

## 2011-11-12 LAB — CBC
HCT: 47.4 % (ref 39.0–52.0)
Hemoglobin: 16.7 g/dL (ref 13.0–17.0)
RBC: 5.44 MIL/uL (ref 4.22–5.81)

## 2011-11-12 LAB — URINALYSIS, ROUTINE W REFLEX MICROSCOPIC
Ketones, ur: NEGATIVE mg/dL
Specific Gravity, Urine: 1.025 (ref 1.005–1.030)
Urobilinogen, UA: 0.2 mg/dL (ref 0.0–1.0)

## 2011-11-12 MED ORDER — NALOXONE HCL 1 MG/ML IJ SOLN
INTRAMUSCULAR | Status: AC
  Administered 2011-11-12: 22:00:00
  Filled 2011-11-12: qty 4

## 2011-11-12 MED ORDER — NALOXONE HCL 1 MG/ML IJ SOLN
INTRAMUSCULAR | Status: AC
  Administered 2011-11-12: 22:00:00
  Filled 2011-11-12: qty 2

## 2011-11-12 MED ORDER — SODIUM CHLORIDE 0.9 % IV BOLUS (SEPSIS)
1000.0000 mL | Freq: Once | INTRAVENOUS | Status: AC
  Administered 2011-11-12: 1000 mL via INTRAVENOUS

## 2011-11-12 NOTE — ED Notes (Signed)
Seizure pad in place on both arm-rails

## 2011-11-12 NOTE — ED Notes (Signed)
1 bag of belongings located in locked lower cabinet in patient's room. RN Sherian Maroon notified.

## 2011-11-12 NOTE — ED Notes (Signed)
Patient and belongings both been wonded by security

## 2011-11-12 NOTE — ED Notes (Addendum)
Pt awoke and vomitted white, watery substance. Was able to sit up and talk.

## 2011-11-12 NOTE — ED Notes (Signed)
Pt. Inventory: 1 shirt, 1 pair of boxer briefs, 1 pair of pants, 1 key on key chain, and 1 cellphone

## 2011-11-12 NOTE — ED Notes (Signed)
Pt told police he took an entire bottle of trazadone at approx. 830. Now only responsive to pain. Mute. Nasal tr4umpet. 140/60. 110. 107 CBG. Pt has been shaking his head but does not appear to be a seizure. Seizure activity. Hx of alcohol and drug abuse along with depression. Stated he did want to hurt himself to [employer]. 4mg  of narcan in route. IV. No change other than eye opening.

## 2011-11-12 NOTE — ED Provider Notes (Addendum)
History     CSN: [id number]  Arrival date & time 11/12/11  2157   First MD Initiated Contact with Patient 11/12/11 2212      Chief Complaint  Patient presents with  . Drug Overdose    HPI This patient with a history of depression now presents following an overdose.  Per EMS/police the patient met first responders at the door, indicated that he take a significant amount of trazodone and then became obtunded.  Per EMS the patient was hemodynamically stable en route.  EMS placed a nasal trumpet.  EMS also provided 2 doses of Narcan, without noted change in the patient's condition. On ED arrival, the patient awakens to sternal rub, but is somnolent.  He does not answer any questions appropriately.  Level V Caveat  I counted the patient's pills, and it seems as though 20-30 pills of Trazadone may have been ingested. Past Medical History  Diagnosis Date  . Seizures   . Hypertension   . Depression   . Pancreatitis   . Liver cirrhosis   . Coronary artery disease     Past Surgical History  Procedure Date  . Chest surgery   . Left leg surgery     No family history on file.  History  Substance Use Topics  . Smoking status: Former Smoker -- 0.0 packs/day for 20 years  . Smokeless tobacco: Never Used  . Alcohol Use: 1.2 oz/week    2 Cans of beer per week      Review of Systems  Unable to perform ROS: Psychiatric disorder  Genitourinary: Negative.     Allergies  Morphine and Penicillins  Home Medications   Current Outpatient Rx  Name Route Sig Dispense Refill  . AMLODIPINE BESYLATE 10 MG PO TABS Oral Take 1 tablet (10 mg total) by mouth daily. 30 tablet 0  . DIVALPROEX SODIUM ER 500 MG PO TB24 Oral Take 1 tablet (500 mg total) by mouth 2 (two) times daily in the am and at bedtime.. 90 tablet 0  . GABAPENTIN 300 MG PO CAPS Oral Take 1-2 capsules (300-600 mg total) by mouth 3 (three) times daily. Take 1 capsule by mouth twice daily and then, take 2 capsules by mouth at  bedtime 120 capsule 0  . METOPROLOL SUCCINATE ER 50 MG PO TB24 Oral Take 1 tablet (50 mg total) by mouth daily. 30 tablet 0  . OLANZAPINE 5 MG PO TABS Oral Take 1 tablet (5 mg total) by mouth at bedtime. 30 tablet 0  . ROSUVASTATIN CALCIUM 40 MG PO TABS Oral Take 40 mg by mouth at bedtime.     . SERTRALINE HCL 50 MG PO TABS Oral Take 1 tablet (50 mg total) by mouth daily. 30 tablet 0    BP 96/49  Pulse 109  Temp(Src) 97.9 F (36.6 C) (Other (Comment))  Resp 22  SpO2 100%  Physical Exam  Nursing note and vitals reviewed. Constitutional: He appears distressed.       Obtunded M w nasal trumpet in place.  (feminine features)  HENT:  Head: Normocephalic and atraumatic.       Trumpet in place.  3mm pupils, reactive, but does not track.  Eyes: Conjunctivae are normal. Right eye exhibits no discharge. Left eye exhibits no discharge. No scleral icterus.  Cardiovascular: Regular rhythm.  Tachycardia present.   Pulmonary/Chest: No stridor. No respiratory distress. He has decreased breath sounds.       Airway protected.  Abdominal:    Genitourinary: Penis normal.  Musculoskeletal: He exhibits no edema.  Neurological:       Obtunded.  Awakens to sternal rub, but does not talk.  MAES, w downgoing toes on stim.  Skin: Skin is warm and dry. He is not diaphoretic.  Psychiatric:       Unable to assess, but he endorsed SI to police/ems.    ED Course  Procedures (including critical care time)   Labs Reviewed  URINE RAPID DRUG SCREEN (HOSP PERFORMED)  CBC  COMPREHENSIVE METABOLIC PANEL  ETHANOL  URINALYSIS, ROUTINE W REFLEX MICROSCOPIC  ACETAMINOPHEN LEVEL  SALICYLATE LEVEL   No results found.   No diagnosis found.  Cardiac: 90 sr- normal Pulse ox 100% on facemask, abnormal   Date: 11/12/2011  Rate: 111  Rhythm: sinus tachycardia  QRS Axis: normal  Intervals: QT prolonged  ST/T Wave abnormalities: nonspecific ST changes  Conduction Disutrbances:none  Narrative  Interpretation:   Old EKG Reviewed: changes noted    Date: 11/13/2011 - #2  Rate: 140  Rhythm: sinus tachycardia  QRS Axis: normal  Intervals: normal  ST/T Wave abnormalities: nonspecific ST/T changes  Conduction Disutrbances:none  Narrative Interpretation:   Old EKG Reviewed: changes noted Faster rate w diminshed QT vs. Original from this date.  ABNORMAL   a review of the patient's chart notes a significant history of depression   A review of Trazadone toxicity notes few deaths, but the risk of respiratory compromise with doses of >3g.  This was possibly the case in this patient.  Re-check demonstrates the patient vomiting.  11:03 PM The patient is alert and awake.  He states that he took a handful of trazodone.  He also endorses alcohol use.  He states that he is suicidal.  11:39 PM Patient remains awake and alert.  12:08 AM I was called to the bedside due to concerns of hypertension.  Manual reevaluation demonstrates a blood pressure that is appropriate, though the patient's tachycardia has increased.  Tachycardia is one of the known complications of trazodone overdose.  The patient does note chest discomfort with the rapid heart rate.  The patient received Tylenol, both for this and for a low-grade fever.  MDM  This 31 year old male presents after an intentional ingestion in a suicide attempt.  On my initial exam the patient was listless, awakened only to sternal rub and there is concern for impending respiratory distress.  With concern for aspiration, the passage of time since ingestion, the patient did not receive activated charcoal The patient awakened, and on subsequent repeat evaluations is protecting his airway, was awake.  He endorses a suicide attempt.  The patient's vital signs remained stable, though were consistently abnormal with mild tachycardia, borderline hypotension.  Given the patient's endorsement of trazodone use of a possibly 20-30 tablets of 100 mg, he will  be admitted to the step down unit for continued evaluation and management with anticipation of a psych consult when more medically appropriate.  CRITICAL CARE Performed by: Gerhard Munch   Total critical care time: 35  Critical care time was exclusive of separately billable procedures and treating other patients.  Critical care was necessary to treat or prevent imminent or life-threatening deterioration.  Critical care was time spent personally by me on the following activities: development of treatment plan with patient and/or surrogate as well as nursing, discussions with consultants, evaluation of patient's response to treatment, examination of patient, obtaining history from patient or surrogate, ordering and performing treatments and interventions, ordering and review of laboratory studies, ordering and review  of radiographic studies, pulse oximetry and re-evaluation of patient's condition.      Gerhard Munch, MD 11/12/11 2352  Gerhard Munch, MD 11/13/11 9562  Gerhard Munch, MD 11/13/11 [phone]

## 2011-11-13 ENCOUNTER — Inpatient Hospital Stay (HOSPITAL_COMMUNITY): Payer: Self-pay

## 2011-11-13 ENCOUNTER — Encounter (HOSPITAL_COMMUNITY): Payer: Self-pay | Admitting: *Deleted

## 2011-11-13 LAB — COMPREHENSIVE METABOLIC PANEL
ALT: 34 U/L (ref 0–53)
ALT: 30 U/L (ref 0–53)
AST: 41 U/L — ABNORMAL HIGH (ref 0–37)
Albumin: 4.5 g/dL (ref 3.5–5.2)
Albumin: 4.1 g/dL (ref 3.5–5.2)
Alkaline Phosphatase: 60 U/L (ref 39–117)
BUN: 12 mg/dL (ref 6–23)
CO2: 19 mEq/L (ref 19–32)
CO2: 21 mEq/L (ref 19–32)
Calcium: 8.5 mg/dL (ref 8.4–10.5)
Chloride: 96 mEq/L (ref 96–112)
Creatinine, Ser: 1.27 mg/dL (ref 0.50–1.35)
GFR calc Af Amer: 85 mL/min — ABNORMAL LOW (ref >90)
GFR calc non Af Amer: 73 mL/min — ABNORMAL LOW (ref >90)
Glucose, Bld: 125 mg/dL — ABNORMAL HIGH (ref 70–99)
Potassium: 3.3 mEq/L — ABNORMAL LOW (ref 3.5–5.1)
Sodium: 140 mEq/L (ref 135–145)
Sodium: 139 mEq/L (ref 135–145)
Total Bilirubin: 0.2 mg/dL — ABNORMAL LOW (ref 0.3–1.2)
Total Protein: 7.3 g/dL (ref 6.0–8.3)

## 2011-11-13 LAB — CBC
MCH: 31.4 pg (ref 26.0–34.0)
Platelets: 157 10*3/uL (ref 150–400)
RBC: 4.91 MIL/uL (ref 4.22–5.81)
RDW: 14.1 % (ref 11.5–15.5)

## 2011-11-13 LAB — CARDIAC PANEL(CRET KIN+CKTOT+MB+TROPI)
Relative Index: 0.8 (ref 0.0–2.5)
Relative Index: 0.7 (ref 0.0–2.5)
Total CK: 333 U/L — ABNORMAL HIGH (ref 7–232)
Total CK: 512 U/L — ABNORMAL HIGH (ref 7–232)
Troponin I: <0.3 ng/mL (ref <0.30)
Troponin I: <0.3 ng/mL (ref <0.30)

## 2011-11-13 LAB — DIFFERENTIAL
Lymphocytes Relative: 7 % — ABNORMAL LOW (ref 12–46)
Lymphs Abs: 0.7 10*3/uL (ref 0.7–4.0)
Monocytes Relative: 6 % (ref 3–12)
Neutro Abs: 8.5 10*3/uL — ABNORMAL HIGH (ref 1.7–7.7)
Neutrophils Relative %: 87 % — ABNORMAL HIGH (ref 43–77)

## 2011-11-13 LAB — PROTIME-INR
INR: 1.07 (ref 0.00–1.49)
Prothrombin Time: 14.1 seconds (ref 11.6–15.2)

## 2011-11-13 LAB — URINALYSIS, ROUTINE W REFLEX MICROSCOPIC
Bilirubin Urine: NEGATIVE
Nitrite: NEGATIVE
Urobilinogen, UA: 0.2 mg/dL (ref 0.0–1.0)

## 2011-11-13 LAB — GLUCOSE, CAPILLARY

## 2011-11-13 LAB — PRO B NATRIURETIC PEPTIDE: Pro B Natriuretic peptide (BNP): 51.6 pg/mL (ref 0–125)

## 2011-11-13 LAB — APTT: aPTT: 26 seconds (ref 24–37)

## 2011-11-13 LAB — MRSA PCR SCREENING: MRSA by PCR: NEGATIVE

## 2011-11-13 LAB — URINE MICROSCOPIC-ADD ON

## 2011-11-13 MED ORDER — GABAPENTIN 300 MG PO CAPS
300.0000 mg | ORAL_CAPSULE | Freq: Two times a day (BID) | ORAL | Status: DC
  Administered 2011-11-13 – 2011-11-14 (×3): 300 mg via ORAL
  Filled 2011-11-13 (×4): qty 1

## 2011-11-13 MED ORDER — FOLIC ACID 1 MG PO TABS: 1.0000 mg | ORAL_TABLET | Freq: Every day | ORAL | Status: DC

## 2011-11-13 MED ORDER — ACETAMINOPHEN 500 MG PO TABS
1000.0000 mg | ORAL_TABLET | Freq: Once | ORAL | Status: AC
  Administered 2011-11-13: 1000 mg via ORAL
  Filled 2011-11-13: qty 2

## 2011-11-13 MED ORDER — AMLODIPINE BESYLATE 10 MG PO TABS
10.0000 mg | ORAL_TABLET | Freq: Every day | ORAL | Status: DC
  Administered 2011-11-13 – 2011-11-16 (×4): 10 mg via ORAL
  Filled 2011-11-13 (×4): qty 1

## 2011-11-13 MED ORDER — MORPHINE SULFATE 2 MG/ML IJ SOLN
1.0000 mg | INTRAMUSCULAR | Status: DC | PRN
  Administered 2011-11-13 – 2011-11-14 (×6): 1 mg via INTRAVENOUS
  Filled 2011-11-13 (×6): qty 1

## 2011-11-13 MED ORDER — TRAZODONE HCL 150 MG PO TABS
300.0000 mg | ORAL_TABLET | Freq: Every day | ORAL | Status: DC
  Filled 2011-11-13: qty 2

## 2011-11-13 MED ORDER — FOLIC ACID 1 MG PO TABS
1.0000 mg | ORAL_TABLET | Freq: Every day | ORAL | Status: DC
  Administered 2011-11-13 – 2011-11-16 (×4): 1 mg via ORAL
  Filled 2011-11-13 (×4): qty 1

## 2011-11-13 MED ORDER — ONDANSETRON HCL 4 MG PO TABS: 4.0000 mg | ORAL_TABLET | Freq: Four times a day (QID) | ORAL | Status: DC | PRN

## 2011-11-13 MED ORDER — SODIUM CHLORIDE 0.9 % IJ SOLN
3.0000 mL | Freq: Two times a day (BID) | INTRAMUSCULAR | Status: DC
  Administered 2011-11-13: 3 mL via INTRAVENOUS
  Administered 2011-11-13: 22:00:00 via INTRAVENOUS
  Administered 2011-11-15 (×2): 3 mL via INTRAVENOUS

## 2011-11-13 MED ORDER — GABAPENTIN 300 MG PO CAPS
600.0000 mg | ORAL_CAPSULE | Freq: Every day | ORAL | Status: DC
  Administered 2011-11-13: 600 mg via ORAL
  Filled 2011-11-13 (×2): qty 2

## 2011-11-13 MED ORDER — SERTRALINE HCL 50 MG PO TABS
50.0000 mg | ORAL_TABLET | Freq: Every day | ORAL | Status: DC
  Administered 2011-11-13 – 2011-11-16 (×4): 50 mg via ORAL
  Filled 2011-11-13 (×4): qty 1

## 2011-11-13 MED ORDER — DIPHENHYDRAMINE HCL 50 MG/ML IJ SOLN
12.5000 mg | Freq: Four times a day (QID) | INTRAMUSCULAR | Status: DC | PRN
  Administered 2011-11-13 – 2011-11-14 (×4): 12.5 mg via INTRAVENOUS
  Filled 2011-11-13 (×4): qty 1

## 2011-11-13 MED ORDER — OLANZAPINE 5 MG PO TABS
5.0000 mg | ORAL_TABLET | Freq: Every day | ORAL | Status: DC
  Administered 2011-11-13 – 2011-11-15 (×3): 5 mg via ORAL
  Filled 2011-11-13 (×4): qty 1

## 2011-11-13 MED ORDER — ONDANSETRON HCL 4 MG/2ML IJ SOLN
4.0000 mg | Freq: Four times a day (QID) | INTRAMUSCULAR | Status: DC | PRN
  Administered 2011-11-13: 4 mg via INTRAVENOUS
  Filled 2011-11-13: qty 2

## 2011-11-13 MED ORDER — IOHEXOL 300 MG/ML  SOLN
100.0000 mL | Freq: Once | INTRAMUSCULAR | Status: AC | PRN
  Administered 2011-11-13: 100 mL via INTRAVENOUS

## 2011-11-13 MED ORDER — LABETALOL HCL 100 MG PO TABS
100.0000 mg | ORAL_TABLET | Freq: Two times a day (BID) | ORAL | Status: DC
  Administered 2011-11-13 (×2): 100 mg via ORAL
  Filled 2011-11-13 (×5): qty 1

## 2011-11-13 MED ORDER — LORAZEPAM 2 MG/ML IJ SOLN: 0.0000 mg | Freq: Two times a day (BID) | INTRAMUSCULAR | Status: DC

## 2011-11-13 MED ORDER — ADULT MULTIVITAMIN W/MINERALS CH
1.0000 | ORAL_TABLET | Freq: Every day | ORAL | Status: DC
  Administered 2011-11-13 – 2011-11-16 (×4): 1 via ORAL
  Filled 2011-11-13 (×4): qty 1

## 2011-11-13 MED ORDER — ATORVASTATIN CALCIUM 40 MG PO TABS
40.0000 mg | ORAL_TABLET | Freq: Every day | ORAL | Status: DC
  Administered 2011-11-13 – 2011-11-16 (×4): 40 mg via ORAL
  Filled 2011-11-13 (×4): qty 1

## 2011-11-13 MED ORDER — LORAZEPAM 2 MG/ML IJ SOLN
1.0000 mg | Freq: Four times a day (QID) | INTRAMUSCULAR | Status: DC | PRN
  Administered 2011-11-13: 06:00:00 via INTRAVENOUS
  Administered 2011-11-13 (×2): 1 mg via INTRAVENOUS
  Filled 2011-11-13: qty 1
  Filled 2011-11-13: qty 2
  Filled 2011-11-13 (×2): qty 1

## 2011-11-13 MED ORDER — LEVOFLOXACIN IN D5W 750 MG/150ML IV SOLN
750.0000 mg | INTRAVENOUS | Status: DC
  Administered 2011-11-13 – 2011-11-15 (×3): 750 mg via INTRAVENOUS
  Filled 2011-11-13 (×3): qty 150

## 2011-11-13 MED ORDER — ACETAMINOPHEN 650 MG RE SUPP: 650.0000 mg | Freq: Four times a day (QID) | RECTAL | Status: DC | PRN

## 2011-11-13 MED ORDER — DIVALPROEX SODIUM ER 500 MG PO TB24
500.0000 mg | ORAL_TABLET | ORAL | Status: DC
  Administered 2011-11-13 – 2011-11-16 (×7): 500 mg via ORAL
  Filled 2011-11-13 (×9): qty 1

## 2011-11-13 MED ORDER — LORAZEPAM 1 MG PO TABS: 1.0000 mg | ORAL_TABLET | Freq: Four times a day (QID) | ORAL | Status: DC | PRN

## 2011-11-13 MED ORDER — ENOXAPARIN SODIUM 40 MG/0.4ML ~~LOC~~ SOLN
40.0000 mg | SUBCUTANEOUS | Status: DC
  Administered 2011-11-13 – 2011-11-16 (×4): 40 mg via SUBCUTANEOUS
  Filled 2011-11-13 (×4): qty 0.4

## 2011-11-13 MED ORDER — VITAMIN B-1 100 MG PO TABS: 100.0000 mg | ORAL_TABLET | Freq: Every day | ORAL | Status: DC

## 2011-11-13 MED ORDER — SODIUM CHLORIDE 0.9 % IV SOLN
INTRAVENOUS | Status: DC
  Administered 2011-11-13 – 2011-11-14 (×5): via INTRAVENOUS
  Administered 2011-11-14: 1000 mL via INTRAVENOUS
  Administered 2011-11-15: 19:00:00 via INTRAVENOUS
  Administered 2011-11-15: 1000 mL via INTRAVENOUS
  Administered 2011-11-15: 13:00:00 via INTRAVENOUS

## 2011-11-13 MED ORDER — GABAPENTIN 300 MG PO CAPS: 300.0000 mg | ORAL_CAPSULE | Freq: Three times a day (TID) | ORAL | Status: DC

## 2011-11-13 MED ORDER — THIAMINE HCL 100 MG/ML IJ SOLN
100.0000 mg | Freq: Every day | INTRAMUSCULAR | Status: DC
  Filled 2011-11-13 (×4): qty 1

## 2011-11-13 MED ORDER — LORAZEPAM 2 MG/ML IJ SOLN
0.0000 mg | Freq: Four times a day (QID) | INTRAMUSCULAR | Status: DC
  Administered 2011-11-13: 4 mg via INTRAVENOUS
  Administered 2011-11-13: 2 mg via INTRAVENOUS
  Administered 2011-11-14: 1 mg via INTRAVENOUS
  Filled 2011-11-13 (×2): qty 1

## 2011-11-13 MED ORDER — VITAMIN B-1 100 MG PO TABS
100.0000 mg | ORAL_TABLET | Freq: Every day | ORAL | Status: DC
  Administered 2011-11-13 – 2011-11-16 (×4): 100 mg via ORAL
  Filled 2011-11-13 (×4): qty 1
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
## 2011-11-13 NOTE — ED Notes (Addendum)
Dr. Elisabeth Pigeon at bedside. Made aware that BP on monitor was not accurate.

## 2011-11-13 NOTE — H&P (Signed)
PCP:  Julieanne Manson, MD, MD   DOA:  11/12/2011 10:04 PM  Chief Complaint:  Acute alcohol intoxication  HPI: 31 year old male with history of substance abuse (alcohol, cocaine), HTN, dyslipidemia who was brought to ED for complaints of suicidal ideations, attempted suicide by drug overdose. Patient reports he wanted to kill himself because his life is just not going the right way. Patient is a poor historian and does not exactly remember how was he brought to ED. He denies chest pain, shortness of breath, cough, fever or chills. Patient denies abdominal pain but does report nausea and vomiting even in ED.   Assessment/Plan  Principal Problem:   *Acute alcohol intoxication - Alcohol level 105 - initiate CIWA protocol - withdrawal precaution - continue IV fluid NS @ 100cc/hr - keep NPO - provide antiemetics PRN - may start folic acid and thiamine - admit to stepdown for next 24 hours  Active Problems:   HYPERCHOLESTEROLEMIA - continue atorvastatin   DEPRESSION, MAJOR, RECURRENT/ SUICIDAL IDEATIONS - continue olanzapine and Zyprexa - psychiatry consult called   SUBSTANCE ABUSE, MULTIPLE - including but not limited to cocaine - hold trazodone as patient overdoses on trazodone; poison control called from ED and recommendation was that charcoal was not needed - Social Work consult for substance abuse   HYPERTENSION - continue Norvasc - discontinue metoprolol as patient is actively abusing cocaine   SEIZURE DISORDER - check Depakote level - continue Depakote and gabapentin - seizure precaution   LIVER FUNCTION TESTS, ABNORMAL - history of abnormal LFTs - secondary to alcohol abuse  DVT Prophylaxis - lovenox sub Q  Code Status - full code  Education  - test results and diagnostic studies were discussed with patient  - patient  verbalized the understanding; patient did not want his parents to know he is in the hospital - questions were answered at the bedside and  contact information was provided for additional questions or concerns  Allergies: Allergies  Allergen Reactions  . Morphine Itching  . Penicillins Swelling    Prior to Admission medications   Medication Sig Start Date End Date Taking? Authorizing Provider  amLODipine (NORVASC) 10 MG tablet Take 1 tablet (10 mg total) by mouth daily. 10/20/11  Yes Verne Spurr, PA-C  atorvastatin (LIPITOR) 40 MG tablet Take 40 mg by mouth daily.   Yes Historical Provider, MD  divalproex (DEPAKOTE ER) 500 MG 24 hr tablet Take 1 tablet (500 mg total) by mouth 2 (two) times daily in the am and at bedtime.. 10/20/11 10/19/12 Yes Verne Spurr, PA-C  gabapentin (NEURONTIN) 300 MG capsule Take 1-2 capsules (300-600 mg total) by mouth 3 (three) times daily. Take 1 capsule by mouth twice daily and then, take 2 capsules by mouth at bedtime 10/20/11  Yes Verne Spurr, PA-C  metoprolol succinate (TOPROL-XL) 50 MG 24 hr tablet Take 1 tablet (50 mg total) by mouth daily. 10/20/11  Yes Neil Mashburn, PA-C  OLANZapine (ZYPREXA) 5 MG tablet Take 1 tablet (5 mg total) by mouth at bedtime. 10/20/11 11/19/11 Yes Neil Mashburn, PA-C  sertraline (ZOLOFT) 50 MG tablet Take 1 tablet (50 mg total) by mouth daily. 10/20/11 10/19/12 Yes Verne Spurr, PA-C  traZODone (DESYREL) 100 MG tablet Take 300 mg by mouth at bedtime.   Yes Historical Provider, MD    Past Medical History  Diagnosis Date  . Seizures   . Hypertension   . Depression   . Pancreatitis   . Liver cirrhosis   . Coronary artery disease  Past Surgical History  Procedure Date  . Chest surgery   . Left leg surgery     Social History:  reports that he has quit smoking. He has never used smokeless tobacco. He reports that he drinks about 1.2 ounces of alcohol per week. He reports that he does not use illicit drugs.  No family history on file.  Review of Systems:  Constitutional: Denies fever, chills, diaphoresis, appetite change and fatigue.  HEENT: Denies photophobia,  eye pain, redness, hearing loss, ear pain, congestion, sore throat, rhinorrhea, sneezing, mouth sores, trouble swallowing, neck pain, neck stiffness and tinnitus.   Respiratory: Denies SOB, DOE, cough, chest tightness,  and wheezing.   Cardiovascular: Denies chest pain, palpitations and leg swelling.  Gastrointestinal: Denies nausea, vomiting, abdominal pain, diarrhea, constipation, blood in stool and abdominal distention.  Genitourinary: Denies dysuria, urgency, frequency, hematuria, flank pain and difficulty urinating.  Musculoskeletal: Denies myalgias, back pain, joint swelling, arthralgias and gait problem.  Skin: Denies pallor, rash and wound.  Neurological: Denies dizziness, seizures, syncope, weakness, light-headedness, numbness and headaches.  Hematological: Denies adenopathy. Easy bruising, personal or family bleeding history  Psychiatric/Behavioral: as per HPI   Physical Exam:  Filed Vitals:   11/12/11 2240 11/12/11 2245 11/12/11 2300 11/12/11 2315  BP: 127/75 127/76 131/83   Pulse: 131 113 112   Temp: 100.1 F (37.8 C) 100.1 F (37.8 C) 100.1 F (37.8 C) 100.2 F (37.9 C)  TempSrc:      Resp: 22 19 22    SpO2: 99% 100% 100%     Constitutional: Vital signs reviewed.  Patient is in no acute distress and cooperative with exam. Alert and oriented x3.  Head: Normocephalic and atraumatic Ear: TM normal bilaterally Mouth: no erythema or exudates, MMM Eyes: PERRL, EOMI, conjunctivae normal, No scleral icterus.  Neck: Supple, Trachea midline normal ROM, No JVD, mass, thyromegaly, or carotid bruit present.  Cardiovascular: RRR, S1 normal, S2 normal, no MRG, pulses symmetric and intact bilaterally Pulmonary/Chest: CTAB, no wheezes, rales, or rhonchi Abdominal: Soft. Non-tender, non-distended, bowel sounds are normal, no masses, organomegaly, or guarding present.  GU: no CVA tenderness Musculoskeletal: No joint deformities, erythema, or stiffness, ROM full and no nontender Ext:  no edema and no cyanosis, pulses palpable bilaterally (DP and PT) Hematology: no cervical, inginal, or axillary adenopathy.  Neurological: A&O x3, Strenght is normal and symmetric bilaterally, cranial nerve II-XII are grossly intact, no focal motor deficit, sensory intact to light touch bilaterally.  Skin: Warm, dry and intact. No rash, cyanosis, or clubbing.  Psychiatric: Normal mood and affect. speech and behavior is normal. Judgment and thought content normal. Cognition and memory are normal.   Labs on Admission:  Results for orders placed during the hospital encounter of 11/12/11 (from the past 48 hour(s))  URINE RAPID DRUG SCREEN (HOSP PERFORMED)     Status: Abnormal   Collection Time   11/12/11 10:17 PM      Component Value Range Comment   Opiates NONE DETECTED  NONE DETECTED     Cocaine POSITIVE (*) NONE DETECTED     Benzodiazepines NONE DETECTED  NONE DETECTED     Amphetamines NONE DETECTED  NONE DETECTED     Tetrahydrocannabinol NONE DETECTED  NONE DETECTED     Barbiturates NONE DETECTED  NONE DETECTED    URINALYSIS, ROUTINE W REFLEX MICROSCOPIC     Status: Abnormal   Collection Time   11/12/11 10:17 PM      Component Value Range Comment   Color, Urine AMBER (*)  YELLOW  BIOCHEMICALS MAY BE AFFECTED BY COLOR   APPearance CLOUDY (*) CLEAR     Specific Gravity, Urine 1.025  1.005 - 1.030     pH 6.0  5.0 - 8.0     Glucose, UA NEGATIVE  NEGATIVE (mg/dL)    Hgb urine dipstick TRACE (*) NEGATIVE     Bilirubin Urine SMALL (*) NEGATIVE     Ketones, ur NEGATIVE  NEGATIVE (mg/dL)    Protein, ur >562 (*) NEGATIVE (mg/dL)    Urobilinogen, UA 0.2  0.0 - 1.0 (mg/dL)    Nitrite NEGATIVE  NEGATIVE     Leukocytes, UA NEGATIVE  NEGATIVE    URINE MICROSCOPIC-ADD ON     Status: Abnormal   Collection Time   11/12/11 10:17 PM      Component Value Range Comment   Squamous Epithelial / LPF RARE  RARE     WBC, UA 3-6  <3 (WBC/hpf)    RBC / HPF 3-6  <3 (RBC/hpf)    Bacteria, UA MANY (*) RARE      Casts HYALINE CASTS (*) NEGATIVE     Urine-Other MUCOUS PRESENT     CBC     Status: Normal   Collection Time   11/12/11 10:41 PM      Component Value Range Comment   WBC 7.6  4.0 - 10.5 (K/uL)    RBC 5.44  4.22 - 5.81 (MIL/uL)    Hemoglobin 16.7  13.0 - 17.0 (g/dL)    HCT 13.0  86.5 - 78.4 (%)    MCV 87.1  78.0 - 100.0 (fL)    MCH 30.7  26.0 - 34.0 (pg)    MCHC 35.2  30.0 - 36.0 (g/dL)    RDW 69.6  29.5 - 28.4 (%)    Platelets 155  150 - 400 (K/uL)   COMPREHENSIVE METABOLIC PANEL     Status: Abnormal (Preliminary result)   Collection Time   11/12/11 10:41 PM      Component Value Range Comment   Sodium PENDING  135 - 145 (mEq/L)    Potassium PENDING  3.5 - 5.1 (mEq/L)    Chloride PENDING  96 - 112 (mEq/L)    CO2 PENDING  19 - 32 (mEq/L)    Glucose, Bld 125 (*) 70 - 99 (mg/dL)    BUN 12  6 - 23 (mg/dL)    Creatinine, Ser 1.32  0.50 - 1.35 (mg/dL)    Calcium 8.7  8.4 - 10.5 (mg/dL)    Total Protein 7.8  6.0 - 8.3 (g/dL)    Albumin 4.5  3.5 - 5.2 (g/dL)    AST 47 (*) 0 - 37 (U/L)    ALT 34  0 - 53 (U/L)    Alkaline Phosphatase 60  39 - 117 (U/L)    Total Bilirubin 0.2 (*) 0.3 - 1.2 (mg/dL)    GFR calc non Af Amer 73 (*) >90 (mL/min)    GFR calc Af Amer 85 (*) >90 (mL/min)   ETHANOL     Status: Abnormal   Collection Time   11/12/11 10:41 PM      Component Value Range Comment   Alcohol, Ethyl (B) 105 (*) 0 - 11 (mg/dL)   ACETAMINOPHEN LEVEL     Status: Normal   Collection Time   11/12/11 10:41 PM      Component Value Range Comment   Acetaminophen (Tylenol), Serum <15.0  10 - 30 (ug/mL)   SALICYLATE LEVEL     Status: Abnormal   Collection  Time   11/12/11 10:41 PM      Component Value Range Comment   Salicylate Lvl <2.0 (*) 2.8 - 20.0 (mg/dL)     Radiological Exams on Admission: No results found. Time Spent on Admission: Over 30 minutes  Maygan Koeller 11/13/2011, 12:00 AM  [facility] Pager # [phone] Main Office # [phone]

## 2011-11-13 NOTE — Progress Notes (Signed)
Pt's HR sustained at 120-140s NSR, being continuously monitored. NP Lenny Pastel made aware. No new orders. Will continue to monitor pt.

## 2011-11-13 NOTE — ED Notes (Signed)
Pt having CP and HR rising. Dr. Jeraldine Loots made aware and in room.

## 2011-11-13 NOTE — Progress Notes (Signed)
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 Usually is drunk when he goes to work He received a warning to be fired from his job because of this The patient was upset and decided to end his life by drinking and using cocaine  Objective: Vital signs in last 24 hours: Filed Vitals:   11/13/11 0700 11/13/11 0800 11/13/11 0824 11/13/11 1111  BP: 137/75 120/79  123/63  Pulse: 122 118  120  Temp:   99.2 F (37.3 C) 99.3 F (37.4 C)  TempSrc:   Axillary   Resp: 22 19  16   Height:      Weight:      SpO2: 98% 98%  97%    Intake/Output Summary (Last 24 hours) at 11/13/11 1149 Last data filed at 11/13/11 1100  Gross per 24 hour  Intake   1592 ml  Output    532 ml  Net   1060 ml    Weight change:   Head: Normocephalic and atraumatic  Ear: TM normal bilaterally  Mouth: no erythema or exudates, MMM  Eyes: PERRL, EOMI, conjunctivae normal, No scleral icterus.  Neck: Supple, Trachea midline normal ROM, No JVD, mass, thyromegaly, or carotid bruit present.  Cardiovascular: RRR, S1 normal, S2 normal, no MRG, pulses symmetric and intact bilaterally  Pulmonary/Chest: CTAB, no wheezes, rales, or rhonchi  Abdominal: Soft. Non-tender, non-distended, bowel sounds are normal, no masses, organomegaly, or guarding present.  GU: no CVA tenderness Musculoskeletal: No joint deformities, erythema, or stiffness, ROM full and no nontender Ext: no edema and no cyanosis, pulses palpable bilaterally (DP and PT)  Hematology: no cervical, inginal, or axillary adenopathy.  Neurological: A&O x3, Strenght is normal and symmetric bilaterally, cranial nerve II-XII are grossly intact, no focal motor deficit, sensory intact to light touch bilaterally.  Skin: Warm, dry and intact. No rash, cyanosis, or clubbing.  Psychiatric: Normal mood and affect. speech and behavior is normal. Judgment and thought content normal. Cognition and  memory are normal.      Lab Results: Results for orders placed during the hospital encounter of 11/12/11 (from the past 24 hour(s))  URINE RAPID DRUG SCREEN (HOSP PERFORMED)     Status: Abnormal   Collection Time   11/12/11 10:17 PM      Component Value Range   Opiates NONE DETECTED  NONE DETECTED    Cocaine POSITIVE (*) NONE DETECTED    Benzodiazepines NONE DETECTED  NONE DETECTED    Amphetamines NONE DETECTED  NONE DETECTED    Tetrahydrocannabinol NONE DETECTED  NONE DETECTED    Barbiturates NONE DETECTED  NONE DETECTED   URINALYSIS, ROUTINE W REFLEX MICROSCOPIC     Status: Abnormal   Collection Time   11/12/11 10:17 PM      Component Value Range   Color, Urine AMBER (*) YELLOW    APPearance CLOUDY (*) CLEAR    Specific Gravity, Urine 1.025  1.005 - 1.030    pH 6.0  5.0 - 8.0    Glucose, UA NEGATIVE  NEGATIVE (mg/dL)   Hgb urine dipstick TRACE (*) NEGATIVE    Bilirubin Urine SMALL (*) NEGATIVE    Ketones, ur NEGATIVE  NEGATIVE (mg/dL)   Protein, ur >409 (*) NEGATIVE (mg/dL)   Urobilinogen, UA 0.2  0.0 - 1.0 (mg/dL)   Nitrite NEGATIVE  NEGATIVE    Leukocytes, UA NEGATIVE  NEGATIVE   URINE MICROSCOPIC-ADD ON     Status: Abnormal  Collection Time   11/12/11 10:17 PM      Component Value Range   Squamous Epithelial / LPF RARE  RARE    WBC, UA 3-6  <3 (WBC/hpf)   RBC / HPF 3-6  <3 (RBC/hpf)   Bacteria, UA MANY (*) RARE    Casts HYALINE CASTS (*) NEGATIVE    Urine-Other MUCOUS PRESENT    CBC     Status: Normal   Collection Time   11/12/11 10:41 PM      Component Value Range   WBC 7.6  4.0 - 10.5 (K/uL)   RBC 5.44  4.22 - 5.81 (MIL/uL)   Hemoglobin 16.7  13.0 - 17.0 (g/dL)   HCT 40.9  81.1 - 91.4 (%)   MCV 87.1  78.0 - 100.0 (fL)   MCH 30.7  26.0 - 34.0 (pg)   MCHC 35.2  30.0 - 36.0 (g/dL)   RDW 78.2  95.6 - 21.3 (%)   Platelets 155  150 - 400 (K/uL)  COMPREHENSIVE METABOLIC PANEL     Status: Abnormal   Collection Time   11/12/11 10:41 PM      Component Value Range     Sodium 140  135 - 145 (mEq/L)   Potassium 3.3 (*) 3.5 - 5.1 (mEq/L)   Chloride 96  96 - 112 (mEq/L)   CO2 19  19 - 32 (mEq/L)   Glucose, Bld 125 (*) 70 - 99 (mg/dL)   BUN 12  6 - 23 (mg/dL)   Creatinine, Ser 0.86  0.50 - 1.35 (mg/dL)   Calcium 8.7  8.4 - 57.8 (mg/dL)   Total Protein 7.8  6.0 - 8.3 (g/dL)   Albumin 4.5  3.5 - 5.2 (g/dL)   AST 47 (*) 0 - 37 (U/L)   ALT 34  0 - 53 (U/L)   Alkaline Phosphatase 60  39 - 117 (U/L)   Total Bilirubin 0.2 (*) 0.3 - 1.2 (mg/dL)   GFR calc non Af Amer 73 (*) >90 (mL/min)   GFR calc Af Amer 85 (*) >90 (mL/min)  ETHANOL     Status: Abnormal   Collection Time   11/12/11 10:41 PM      Component Value Range   Alcohol, Ethyl (B) 105 (*) 0 - 11 (mg/dL)  ACETAMINOPHEN LEVEL     Status: Normal   Collection Time   11/12/11 10:41 PM      Component Value Range   Acetaminophen (Tylenol), Serum <15.0  10 - 30 (ug/mL)  SALICYLATE LEVEL     Status: Abnormal   Collection Time   11/12/11 10:41 PM      Component Value Range   Salicylate Lvl <2.0 (*) 2.8 - 20.0 (mg/dL)  URINALYSIS, ROUTINE W REFLEX MICROSCOPIC     Status: Abnormal   Collection Time   11/13/11  2:23 AM      Component Value Range   Color, Urine YELLOW  YELLOW    APPearance CLOUDY (*) CLEAR    Specific Gravity, Urine 1.024  1.005 - 1.030    pH 6.0  5.0 - 8.0    Glucose, UA NEGATIVE  NEGATIVE (mg/dL)   Hgb urine dipstick SMALL (*) NEGATIVE    Bilirubin Urine NEGATIVE  NEGATIVE    Ketones, ur 15 (*) NEGATIVE (mg/dL)   Protein, ur 469 (*) NEGATIVE (mg/dL)   Urobilinogen, UA 0.2  0.0 - 1.0 (mg/dL)   Nitrite NEGATIVE  NEGATIVE    Leukocytes, UA SMALL (*) NEGATIVE   MRSA PCR SCREENING  Status: Normal   Collection Time   11/13/11  2:23 AM      Component Value Range   MRSA by PCR NEGATIVE  NEGATIVE   URINE MICROSCOPIC-ADD ON     Status: Abnormal   Collection Time   11/13/11  2:23 AM      Component Value Range   Squamous Epithelial / LPF RARE  RARE    WBC, UA 3-6  <3 (WBC/hpf)   RBC /  HPF 3-6  <3 (RBC/hpf)   Bacteria, UA MANY (*) RARE    Urine-Other MUCOUS PRESENT    CBC     Status: Abnormal   Collection Time   11/13/11  2:44 AM      Component Value Range   WBC 9.8  4.0 - 10.5 (K/uL)   RBC 4.91  4.22 - 5.81 (MIL/uL)   Hemoglobin 15.4  13.0 - 17.0 (g/dL)   HCT 45.4  09.8 - 11.9 (%)   MCV 86.4  78.0 - 100.0 (fL)   MCH 31.4  26.0 - 34.0 (pg)   MCHC 36.3 (*) 30.0 - 36.0 (g/dL)   RDW 14.7  82.9 - 56.2 (%)   Platelets 157  150 - 400 (K/uL)  COMPREHENSIVE METABOLIC PANEL     Status: Abnormal   Collection Time   11/13/11  2:44 AM      Component Value Range   Sodium 139  135 - 145 (mEq/L)   Potassium 4.9  3.5 - 5.1 (mEq/L)   Chloride 99  96 - 112 (mEq/L)   CO2 21  19 - 32 (mEq/L)   Glucose, Bld 118 (*) 70 - 99 (mg/dL)   BUN 12  6 - 23 (mg/dL)   Creatinine, Ser 1.30  0.50 - 1.35 (mg/dL)   Calcium 8.5  8.4 - 86.5 (mg/dL)   Total Protein 7.3  6.0 - 8.3 (g/dL)   Albumin 4.1  3.5 - 5.2 (g/dL)   AST 41 (*) 0 - 37 (U/L)   ALT 30  0 - 53 (U/L)   Alkaline Phosphatase 55  39 - 117 (U/L)   Total Bilirubin 0.3  0.3 - 1.2 (mg/dL)   GFR calc non Af Amer >90  >90 (mL/min)   GFR calc Af Amer >90  >90 (mL/min)  MAGNESIUM     Status: Abnormal   Collection Time   11/13/11  2:44 AM      Component Value Range   Magnesium 1.2 (*) 1.5 - 2.5 (mg/dL)  PHOSPHORUS     Status: Normal   Collection Time   11/13/11  2:44 AM      Component Value Range   Phosphorus 2.9  2.3 - 4.6 (mg/dL)  DIFFERENTIAL     Status: Abnormal   Collection Time   11/13/11  2:44 AM      Component Value Range   Neutrophils Relative 87 (*) 43 - 77 (%)   Neutro Abs 8.5 (*) 1.7 - 7.7 (K/uL)   Lymphocytes Relative 7 (*) 12 - 46 (%)   Lymphs Abs 0.7  0.7 - 4.0 (K/uL)   Monocytes Relative 6  3 - 12 (%)   Monocytes Absolute 0.6  0.1 - 1.0 (K/uL)   Eosinophils Relative 0  0 - 5 (%)   Eosinophils Absolute 0.0  0.0 - 0.7 (K/uL)   Basophils Relative 0  0 - 1 (%)   Basophils Absolute 0.0  0.0 - 0.1 (K/uL)  APTT      Status: Normal   Collection Time   11/13/11  2:44 AM      Component Value Range   aPTT 26  24 - 37 (seconds)  PROTIME-INR     Status: Normal   Collection Time   11/13/11  2:44 AM      Component Value Range   Prothrombin Time 14.1  11.6 - 15.2 (seconds)   INR 1.07  0.00 - 1.49   PRO B NATRIURETIC PEPTIDE     Status: Normal   Collection Time   11/13/11  2:44 AM      Component Value Range   Pro B Natriuretic peptide (BNP) 51.6  0 - 125 (pg/mL)  CARDIAC PANEL(CRET KIN+CKTOT+MB+TROPI)     Status: Abnormal   Collection Time   11/13/11  2:44 AM      Component Value Range   Total CK 333 (*) 7 - 232 (U/L)   CK, MB 3.5  0.3 - 4.0 (ng/mL)   Troponin I <0.30  <0.30 (ng/mL)   Relative Index 1.1  0.0 - 2.5   VALPROIC ACID LEVEL     Status: Abnormal   Collection Time   11/13/11  2:44 AM      Component Value Range   Valproic Acid Lvl <10.0 (*) 50.0 - 100.0 (ug/mL)  GLUCOSE, CAPILLARY     Status: Abnormal   Collection Time   11/13/11  8:31 AM      Component Value Range   Glucose-Capillary 110 (*) 70 - 99 (mg/dL)   Comment 1 Documented in Chart     Comment 2 Notify RN    CARDIAC PANEL(CRET KIN+CKTOT+MB+TROPI)     Status: Abnormal   Collection Time   11/13/11 10:24 AM      Component Value Range   Total CK 438 (*) 7 - 232 (U/L)   CK, MB 3.5  0.3 - 4.0 (ng/mL)   Troponin I <0.30  <0.30 (ng/mL)   Relative Index 0.8  0.0 - 2.5   D-DIMER, QUANTITATIVE     Status: Abnormal   Collection Time   11/13/11 10:24 AM      Component Value Range   D-Dimer, Quant 0.74 (*) 0.00 - 0.48 (ug/mL-FEU)     Micro: Recent Results (from the past 240 hour(s))  MRSA PCR SCREENING     Status: Normal   Collection Time   11/13/11  2:23 AM      Component Value Range Status Comment   MRSA by PCR NEGATIVE  NEGATIVE  Final     Studies/Results: Ct Angio Chest W/cm &/or Wo Cm  11/13/2011  *RADIOLOGY REPORT*  Clinical Data: Rule out pulmonary embolus.  CT ANGIOGRAPHY CHEST  Technique:  Multidetector CT imaging of the  chest using the standard protocol during bolus administration of intravenous contrast. Multiplanar reconstructed images including MIPs were obtained and reviewed to evaluate the vascular anatomy.  Contrast: OMNIPAQUE IOHEXOL 300 MG/ML  SOLN  Comparison: 04/06/2007  Findings: There is abnormal skin thickening and subcutaneous fat stranding overlying the ventral chest wall.  No discrete fluid collection identified. This may be compatible with cellulitis.  No enlarged axillary or supraclavicular lymph nodes.  There is no enlarged mediastinal or hilar adenopathy.  No pericardial or pleural effusion.  No abnormal filling defects are identified within the main pulmonary artery or their branches to suggest acute pulmonary embolus.  Atelectasis is noted in the left base.  No airspace consolidation.  No pulmonary nodule or mass.  IMPRESSION:  1.  Negative for acute pulmonary embolus. 2.  Left base atelectasis. 3.  Abnormal skin thickening  and subcutaneous fat stranding involving the ventral chest wall.  Correlate for any clinical signs or symptoms of cellulitis.  Original Report Authenticated By: Rosealee Albee, M.D.    Medications:  Scheduled Meds:   . acetaminophen  1,000 mg Oral Once  . amLODipine  10 mg Oral Daily  . atorvastatin  40 mg Oral Daily  . divalproex  500 mg Oral BH-qamhs  . enoxaparin  40 mg Subcutaneous Q24H  . folic acid  1 mg Oral Daily  . gabapentin  300 mg Oral BID  . gabapentin  600 mg Oral QHS  . labetalol  100 mg Oral BID  . levofloxacin (LEVAQUIN) IV  750 mg Intravenous Q24H  . LORazepam  0-4 mg Intravenous Q6H   Followed by  . LORazepam  0-4 mg Intravenous Q12H  . mulitivitamin with minerals  1 tablet Oral Daily  . naloxone      . naloxone      . OLANZapine  5 mg Oral QHS  . sertraline  50 mg Oral Daily  . sodium chloride  1,000 mL Intravenous Once  . sodium chloride  3 mL Intravenous Q12H  . thiamine  100 mg Oral Daily   Or  . thiamine  100 mg Intravenous Daily    . traZODone  300 mg Oral QHS  . DISCONTD: folic acid  1 mg Oral Daily  . DISCONTD: gabapentin  300-600 mg Oral TID  . DISCONTD: thiamine  100 mg Oral Daily   Continuous Infusions:   . sodium chloride 150 mL/hr (11/13/11 0834)   PRN Meds:.acetaminophen, diphenhydrAMINE, iohexol, LORazepam, LORazepam, morphine injection, ondansetron (ZOFRAN) IV, ondansetron   Assessment: Principal Problem:  *Acute alcohol intoxication Active Problems:  HYPERCHOLESTEROLEMIA  DEPRESSION, MAJOR, RECURRENT  SUBSTANCE ABUSE, MULTIPLE  HYPERTENSION  SEIZURE DISORDER  LIVER FUNCTION TESTS, ABNORMAL   Plan: #1 suicide ideation polysubstance abuse continue olanzapine Zyprexa, psychiatric consult was called yesterday #2 tachycardia in the setting of fever either secondary to alcohol withdrawal versus cocaine abuse versus aspiration pneumonia, no pulmonary embolism on CT, empiric Levaquin, blood cultures #3 history of seizure disorder subtherapeutic valproic acid level, doubt that patient has been compliant     LOS: 1 day   [facility] 11/13/2011, 11:49 AM

## 2011-11-13 NOTE — Progress Notes (Deleted)
Admission history completed by interpretor phone.  Patient verbalized understanding.

## 2011-11-14 DIAGNOSIS — I82409 Acute embolism and thrombosis of unspecified deep veins of unspecified lower extremity: Secondary | ICD-10-CM

## 2011-11-14 LAB — GLUCOSE, CAPILLARY: Glucose-Capillary: 150 mg/dL — ABNORMAL HIGH (ref 70–99)

## 2011-11-14 LAB — CARDIAC PANEL(CRET KIN+CKTOT+MB+TROPI)
CK, MB: 1.9 ng/mL (ref 0.3–4.0)
Relative Index: 0.6 (ref 0.0–2.5)
Total CK: 304 U/L — ABNORMAL HIGH (ref 7–232)

## 2011-11-14 MED ORDER — ALUM & MAG HYDROXIDE-SIMETH 200-200-20 MG/5ML PO SUSP
30.0000 mL | Freq: Once | ORAL | Status: AC
  Administered 2011-11-14: 30 mL via ORAL
  Filled 2011-11-14: qty 30

## 2011-11-14 MED ORDER — NITROGLYCERIN 0.4 MG SL SUBL
SUBLINGUAL_TABLET | SUBLINGUAL | Status: AC
  Filled 2011-11-14: qty 25

## 2011-11-14 MED ORDER — DIPHENHYDRAMINE HCL 50 MG/ML IJ SOLN
12.5000 mg | Freq: Once | INTRAMUSCULAR | Status: AC
  Administered 2011-11-14: 12.5 mg via INTRAVENOUS
  Filled 2011-11-14: qty 1

## 2011-11-14 MED ORDER — ASPIRIN EC 81 MG PO TBEC
162.0000 mg | DELAYED_RELEASE_TABLET | Freq: Once | ORAL | Status: AC
  Administered 2011-11-15: 162 mg via ORAL
  Filled 2011-11-14: qty 2

## 2011-11-14 MED ORDER — OXYCODONE-ACETAMINOPHEN 5-325 MG PO TABS
1.0000 | ORAL_TABLET | Freq: Three times a day (TID) | ORAL | Status: DC | PRN
  Administered 2011-11-14 – 2011-11-16 (×3): 1 via ORAL
  Filled 2011-11-14 (×3): qty 1

## 2011-11-14 NOTE — Progress Notes (Signed)
VASCULAR LAB PRELIMINARY  PRELIMINARY  PRELIMINARY  PRELIMINARY  Bilateral lower extremity venous Dopplers completed.    Preliminary report:  There is no DVT or SVT noted in the bilateral lower extremities.  Sherren Kerns [place], 11/14/2011, 9:46 AM

## 2011-11-14 NOTE — Plan of Care (Signed)
Problem: Phase II Progression Outcomes Goal: Progress activity as tolerated unless otherwise ordered Outcome: Progressing Up to chair in early am.

## 2011-11-14 NOTE — Progress Notes (Cosign Needed)
[facility] hospitalist progress note. Chief complaint. Chest pain. History of present illness This 31 year old male in hospital with suicide ideation and polysubstance abuse. There has been seen by psychiatry. Is also had some tachycardia in the setting of fever. Being treated empirically with Levaquin for possible aspiration pneumonia. Complained to nursing staff of central chest pain. I saw the patient at bedside. He described the pain as 8/10 in the sternal area. There was no radiation to the jaw or left arm. There was no diaphoresis but he did say there was some associated nausea. Patient was given one sublingual nitroglycerin in the patient states this is helped with his pain. I also provided a GI cocktail. Finally he was given 2 mg of morphine already ordered as a when necessary. With this combination he states his pain level was down to about a 2 prior to leaving the bedside. A 12-lead EKG was done and his notes some ST depression in the aVL lead. Also T waves appear flipped in V. leads. Vital signs. Temperature 98.2, pulse 87, respiration 16, blood pressure 121/73. O2 sats 99%. General appearance. This is a well-developed young male in no distress. Does not appear to be in any significant discomfort. He is alert, oriented, cooperative. Cardiac. Rate and rhythm regular. No murmur, S3, S4. No jugular venous distention or edema. No significant calf pain. Lungs. He is breath sounds are clear and equal bilaterally. O2 sats are stable. Abdomen. Bowel sounds are present in all 4 quadrants. Diffusely tender with palpation greatest in the epigastric area. No guarding or rebound tenderness. Extremities. No edema in the lower extremities. No Pain and negative Homans. Impression/plan. Problem #1. Complaint of chest pain. There are some EKG changes, particularly flipped T waves appear new. The patient clinically appears quite stable. My suspicion is this is likely pain of GI origin given his physical exam.  Nonetheless I will cycle cardiac enzymes and now and then every 8 hours for a total of 3 sets. If there is any troponin elevation I will contact cardiology. Will repeat a EKG in the a.m. I asked the patient to have me contacted by nursing if he has any further chest pain this evening.

## 2011-11-14 NOTE — Progress Notes (Signed)
Subjective: Patient appears to be very from a length  Objective: Vital signs in last 24 hours: Filed Vitals:   11/14/11 0400 11/14/11 0700 11/14/11 0800 11/14/11 0845  BP: 127/70 122/75  122/62  Pulse: 102 103  106  Temp: 99 F (37.2 C)  97.9 F (36.6 C)   TempSrc: Oral  Oral   Resp: 16 21    Height:      Weight: 80.6 kg (177 lb 11.1 oz)     SpO2: 98% 98%  98%    Intake/Output Summary (Last 24 hours) at 11/14/11 1136 Last data filed at 11/14/11 1100  Gross per 24 hour  Intake   4230 ml  Output   4350 ml  Net   -120 ml    Weight change: 2.5 kg (5 lb 8.2 oz)  Head: Normocephalic and atraumatic  Ear: TM normal bilaterally  Mouth: no erythema or exudates, MMM  Eyes: PERRL, EOMI, conjunctivae normal, No scleral icterus.  Neck: Supple, Trachea midline normal ROM, No JVD, mass, thyromegaly, or carotid bruit present.  Cardiovascular: RRR, S1 normal, S2 normal, no MRG, pulses symmetric and intact bilaterally  Pulmonary/Chest: CTAB, no wheezes, rales, or rhonchi  Abdominal: Soft. Non-tender, non-distended, bowel sounds are normal, no masses, organomegaly, or guarding present.  GU: no CVA tenderness Musculoskeletal: No joint deformities, erythema, or stiffness, ROM full and no nontender Ext: no edema and no cyanosis, pulses palpable bilaterally (DP and PT)  Hematology: no cervical, inginal, or axillary adenopathy.  Neurological: Somnolent, Strenght is normal and symmetric bilaterally, cranial nerve II-XII are grossly intact, no focal motor deficit, sensory intact to light touch bilaterally.  Skin: Warm, dry and intact. No rash, cyanosis, or clubbing.  Psychiatric: Normal mood and affect. speech and behavior is normal. Judgment and thought content normal. Cognition and memory are normal.       Lab Results: Results for orders placed during the hospital encounter of 11/12/11 (from the past 24 hour(s))  CARDIAC PANEL(CRET KIN+CKTOT+MB+TROPI)     Status: Abnormal   Collection Time    11/13/11  5:50 PM      Component Value Range   Total CK 512 (*) 7 - 232 (U/L)   CK, MB 3.4  0.3 - 4.0 (ng/mL)   Troponin I <0.30  <0.30 (ng/mL)   Relative Index 0.7  0.0 - 2.5   GLUCOSE, CAPILLARY     Status: Abnormal   Collection Time   11/14/11  7:45 AM      Component Value Range   Glucose-Capillary 150 (*) 70 - 99 (mg/dL)   Comment 1 Notify RN     Comment 2 Documented in Chart       Micro: Recent Results (from the past 240 hour(s))  MRSA PCR SCREENING     Status: Normal   Collection Time   11/13/11  2:23 AM      Component Value Range Status Comment   MRSA by PCR NEGATIVE  NEGATIVE  Final   CULTURE, BLOOD (ROUTINE X 2)     Status: Normal (Preliminary result)   Collection Time   11/13/11 10:15 AM      Component Value Range Status Comment   Specimen Description BLOOD LEFT ARM   Final    Special Requests BOTTLES DRAWN AEROBIC ONLY [facility]   Final    Culture  Setup Time [id number]   Final    Culture     Final    Value:        BLOOD CULTURE RECEIVED NO GROWTH TO  DATE CULTURE WILL BE HELD FOR 5 DAYS BEFORE ISSUING A FINAL NEGATIVE REPORT   Report Status PENDING   Incomplete   CULTURE, BLOOD (ROUTINE X 2)     Status: Normal (Preliminary result)   Collection Time   11/13/11 10:24 AM      Component Value Range Status Comment   Specimen Description BLOOD RIGHT ARM   Final    Special Requests BOTTLES DRAWN AEROBIC ONLY [facility]   Final    Culture  Setup Time [id number]   Final    Culture     Final    Value:        BLOOD CULTURE RECEIVED NO GROWTH TO DATE CULTURE WILL BE HELD FOR 5 DAYS BEFORE ISSUING A FINAL NEGATIVE REPORT   Report Status PENDING   Incomplete     Studies/Results: Ct Angio Chest W/cm &/or Wo Cm  11/13/2011  *RADIOLOGY REPORT*  Clinical Data: Rule out pulmonary embolus.  CT ANGIOGRAPHY CHEST  Technique:  Multidetector CT imaging of the chest using the standard protocol during bolus administration of intravenous contrast. Multiplanar reconstructed images including MIPs  were obtained and reviewed to evaluate the vascular anatomy.  Contrast: OMNIPAQUE IOHEXOL 300 MG/ML  SOLN  Comparison: 04/06/2007  Findings: There is abnormal skin thickening and subcutaneous fat stranding overlying the ventral chest wall.  No discrete fluid collection identified. This may be compatible with cellulitis.  No enlarged axillary or supraclavicular lymph nodes.  There is no enlarged mediastinal or hilar adenopathy.  No pericardial or pleural effusion.  No abnormal filling defects are identified within the main pulmonary artery or their branches to suggest acute pulmonary embolus.  Atelectasis is noted in the left base.  No airspace consolidation.  No pulmonary nodule or mass.  IMPRESSION:  1.  Negative for acute pulmonary embolus. 2.  Left base atelectasis. 3.  Abnormal skin thickening and subcutaneous fat stranding involving the ventral chest wall.  Correlate for any clinical signs or symptoms of cellulitis.  Original Report Authenticated By: Rosealee Albee, M.D.    Medications:  Scheduled Meds:   . amLODipine  10 mg Oral Daily  . atorvastatin  40 mg Oral Daily  . divalproex  500 mg Oral BH-qamhs  . enoxaparin  40 mg Subcutaneous Q24H  . folic acid  1 mg Oral Daily  . gabapentin  300 mg Oral BID  . gabapentin  600 mg Oral QHS  . labetalol  100 mg Oral BID  . levofloxacin (LEVAQUIN) IV  750 mg Intravenous Q24H  . LORazepam  0-4 mg Intravenous Q6H   Followed by  . LORazepam  0-4 mg Intravenous Q12H  . mulitivitamin with minerals  1 tablet Oral Daily  . OLANZapine  5 mg Oral QHS  . sertraline  50 mg Oral Daily  . sodium chloride  3 mL Intravenous Q12H  . thiamine  100 mg Oral Daily   Or  . thiamine  100 mg Intravenous Daily  . DISCONTD: traZODone  300 mg Oral QHS   Continuous Infusions:   . sodium chloride 150 mL/hr at 11/14/11 1136   PRN Meds:.acetaminophen, diphenhydrAMINE, LORazepam, LORazepam, morphine injection, ondansetron (ZOFRAN) IV,  ondansetron   Assessment: Principal Problem:  *Acute alcohol intoxication Active Problems:  HYPERCHOLESTEROLEMIA  DEPRESSION, MAJOR, RECURRENT  SUBSTANCE ABUSE, MULTIPLE  HYPERTENSION  SEIZURE DISORDER  LIVER FUNCTION TESTS, ABNORMAL   Plan: #1 suicide ideation polysubstance abuse continue olanzapine Zyprexa, psychiatric consult, it is recommended the patient be transferred to acute psychiatric hospital whenever bed  is available. At this time the patient appears to be medically stable. #2 tachycardia in the setting of fever either secondary to alcohol withdrawal versus cocaine abuse versus aspiration pneumonia, no pulmonary embolism on CT, empiric Levaquin, blood cultures  #3 history of seizure disorder subtherapeutic valproic acid level, doubt that patient has been compliant  #4 somnolent, likely secondary to Phenergan, Ativan, gabapentin these medications have been discontinued Transferred to MedSurg      LOS: 2 days   [facility] 11/14/2011, 11:36 AM

## 2011-11-14 NOTE — Progress Notes (Signed)
Report called to [place], Charity fundraiser.  Transport to room 1504 via wheelchair.  Patient alert and oriented.

## 2011-11-15 DIAGNOSIS — T43294A Poisoning by other antidepressants, undetermined, initial encounter: Secondary | ICD-10-CM

## 2011-11-15 DIAGNOSIS — T438X2A Poisoning by other psychotropic drugs, intentional self-harm, initial encounter: Secondary | ICD-10-CM

## 2011-11-15 DIAGNOSIS — F332 Major depressive disorder, recurrent severe without psychotic features: Secondary | ICD-10-CM

## 2011-11-15 LAB — CARDIAC PANEL(CRET KIN+CKTOT+MB+TROPI)
CK, MB: 2.2 ng/mL (ref 0.3–4.0)
Relative Index: 0.9 (ref 0.0–2.5)
Total CK: 266 U/L — ABNORMAL HIGH (ref 7–232)
Total CK: 221 U/L (ref 7–232)

## 2011-11-15 LAB — GLUCOSE, CAPILLARY: Glucose-Capillary: 96 mg/dL (ref 70–99)

## 2011-11-15 MED ORDER — LEVOFLOXACIN 500 MG PO TABS
500.0000 mg | ORAL_TABLET | Freq: Every day | ORAL | Status: DC
  Administered 2011-11-15 – 2011-11-16 (×2): 500 mg via ORAL
  Filled 2011-11-15 (×2): qty 1

## 2011-11-15 MED ORDER — PNEUMOCOCCAL VAC POLYVALENT 25 MCG/0.5ML IJ INJ
0.5000 mL | INJECTION | INTRAMUSCULAR | Status: AC
  Administered 2011-11-16: 0.5 mL via INTRAMUSCULAR
  Filled 2011-11-15: qty 0.5

## 2011-11-15 MED ORDER — ZOLPIDEM TARTRATE 10 MG PO TABS
10.0000 mg | ORAL_TABLET | Freq: Every evening | ORAL | Status: DC | PRN
  Administered 2011-11-15: 10 mg via ORAL
  Filled 2011-11-15: qty 1

## 2011-11-15 NOTE — Progress Notes (Signed)
Met with Pt re: d/c plans.  Pt reports that he wants long-term tx, as he is currently suicidal.  Pt admitted that his substance abuse leads to him feeling suicidal.  Pt amenable to [facility], [facility] and [place].  CSW contacted [facility] and spoke with [place] re: Pt.  Veronica to review Pt's information and contact CSW if a bed is available.  Faxed Pt's information to [facility] [address] and [place].  CSW to continue to follow.  [name], LCSWA Clinical Social Work [phone]

## 2011-11-15 NOTE — Progress Notes (Signed)
Patient still sitting in chair and states he feels a lot better rating his pain at a 2/10.  The burning pain has almost subsided.  Encouraged to rest with hob elevated when in bed this evening.

## 2011-11-15 NOTE — Progress Notes (Signed)
Subjective: Complaining of chest pain 7 on 10, Has been requesting IV morphine throughout the night 12-lead EKG was done and his notes some ST depression in the aVL lead. Also T waves appear flipped in V. leads  Objective: Vital signs in last 24 hours: Filed Vitals:   11/14/11 2004 11/14/11 2100 11/15/11 0509 11/15/11 0800  BP: 122/75 121/73 116/62   Pulse: 100 87 75   Temp: 98.3 F (36.8 C) 98.2 F (36.8 C) 98.5 F (36.9 C)   TempSrc: Oral Oral Oral   Resp: 20 16 16    Height:      Weight:      SpO2: 97% 99% 100% 99%    Intake/Output Summary (Last 24 hours) at 11/15/11 1513 Last data filed at 11/15/11 1428  Gross per 24 hour  Intake   3515 ml  Output   7650 ml  Net  -4135 ml    Weight change:   Head: Normocephalic and atraumatic  Ear: TM normal bilaterally  Mouth: no erythema or exudates, MMM  Eyes: PERRL, EOMI, conjunctivae normal, No scleral icterus.  Neck: Supple, Trachea midline normal ROM, No JVD, mass, thyromegaly, or carotid bruit present.  Cardiovascular: RRR, S1 normal, S2 normal, no MRG, pulses symmetric and intact bilaterally  Pulmonary/Chest: CTAB, no wheezes, rales, or rhonchi  Abdominal: Soft. Non-tender, non-distended, bowel sounds are normal, no masses, organomegaly, or guarding present.  GU: no CVA tenderness Musculoskeletal: No joint deformities, erythema, or stiffness, ROM full and no nontender Ext: no edema and no cyanosis, pulses palpable bilaterally (DP and PT)  Hematology: no cervical, inginal, or axillary adenopathy.  Neurological: Somnolent, Strenght is normal and symmetric bilaterally, cranial nerve II-XII are grossly intact, no focal motor deficit, sensory intact to light touch bilaterally.  Skin: Warm, dry and intact. No rash, cyanosis, or clubbing.  Psychiatric: Normal mood and affect. speech and behavior is normal. Judgment and thought content normal. Cognition and memory are normal.    Lab Results: Results for orders placed during the  hospital encounter of 11/12/11 (from the past 24 hour(s))  CARDIAC PANEL(CRET KIN+CKTOT+MB+TROPI)     Status: Abnormal   Collection Time   11/14/11  9:00 PM      Component Value Range   Total CK 304 (*) 7 - 232 (U/L)   CK, MB 1.9  0.3 - 4.0 (ng/mL)   Troponin I <0.30  <0.30 (ng/mL)   Relative Index 0.6  0.0 - 2.5   CARDIAC PANEL(CRET KIN+CKTOT+MB+TROPI)     Status: Abnormal   Collection Time   11/15/11  4:24 AM      Component Value Range   Total CK 266 (*) 7 - 232 (U/L)   CK, MB 2.2  0.3 - 4.0 (ng/mL)   Troponin I <0.30  <0.30 (ng/mL)   Relative Index 0.8  0.0 - 2.5   GLUCOSE, CAPILLARY     Status: Normal   Collection Time   11/15/11  7:40 AM      Component Value Range   Glucose-Capillary 96  70 - 99 (mg/dL)   Comment 1 Notify RN    CARDIAC PANEL(CRET KIN+CKTOT+MB+TROPI)     Status: Normal   Collection Time   11/15/11 12:18 PM      Component Value Range   Total CK 221  7 - 232 (U/L)   CK, MB 1.9  0.3 - 4.0 (ng/mL)   Troponin I <0.30  <0.30 (ng/mL)   Relative Index 0.9  0.0 - 2.5      Micro: Recent  Results (from the past 240 hour(s))  MRSA PCR SCREENING     Status: Normal   Collection Time   11/13/11  2:23 AM      Component Value Range Status Comment   MRSA by PCR NEGATIVE  NEGATIVE  Final   CULTURE, BLOOD (ROUTINE X 2)     Status: Normal (Preliminary result)   Collection Time   11/13/11 10:15 AM      Component Value Range Status Comment   Specimen Description BLOOD LEFT ARM   Final    Special Requests BOTTLES DRAWN AEROBIC ONLY 2CC   Final    Culture  Setup Time [id number]   Final    Culture     Final    Value:        BLOOD CULTURE RECEIVED NO GROWTH TO DATE CULTURE WILL BE HELD FOR 5 DAYS BEFORE ISSUING A FINAL NEGATIVE REPORT   Report Status PENDING   Incomplete   CULTURE, BLOOD (ROUTINE X 2)     Status: Normal (Preliminary result)   Collection Time   11/13/11 10:24 AM      Component Value Range Status Comment   Specimen Description BLOOD RIGHT ARM   Final     Special Requests BOTTLES DRAWN AEROBIC ONLY 2CC   Final    Culture  Setup Time [id number]   Final    Culture     Final    Value:        BLOOD CULTURE RECEIVED NO GROWTH TO DATE CULTURE WILL BE HELD FOR 5 DAYS BEFORE ISSUING A FINAL NEGATIVE REPORT   Report Status PENDING   Incomplete     Studies/Results: No results found.  Medications:  Scheduled Meds:   . alum & mag hydroxide-simeth  30 mL Oral Once  . amLODipine  10 mg Oral Daily  . aspirin EC  162 mg Oral Once  . atorvastatin  40 mg Oral Daily  . diphenhydrAMINE  12.5 mg Intravenous Once  . diphenhydrAMINE  12.5 mg Intravenous Once  . divalproex  500 mg Oral BH-qamhs  . enoxaparin  40 mg Subcutaneous Q24H  . folic acid  1 mg Oral Daily  . levofloxacin (LEVAQUIN) IV  750 mg Intravenous Q24H  . mulitivitamin with minerals  1 tablet Oral Daily  . OLANZapine  5 mg Oral QHS  . pneumococcal 23 valent vaccine  0.5 mL Intramuscular Tomorrow-1000  . sertraline  50 mg Oral Daily  . sodium chloride  3 mL Intravenous Q12H  . thiamine  100 mg Oral Daily   Or  . thiamine  100 mg Intravenous Daily   Continuous Infusions:   . sodium chloride 150 mL/hr at 11/15/11 1234   PRN Meds:.acetaminophen, ondansetron (ZOFRAN) IV, ondansetron, oxyCODONE-acetaminophen, DISCONTD:  morphine injection   Assessment: Principal Problem:  *Acute alcohol intoxication Active Problems:  HYPERCHOLESTEROLEMIA  DEPRESSION, MAJOR, RECURRENT  SUBSTANCE ABUSE, MULTIPLE  HYPERTENSION  SEIZURE DISORDER  LIVER FUNCTION TESTS, ABNORMAL   Plan: Chest pain Workup has been negative.far Cardiac enzymes cycled multiple times and have been negative CT angina negative for pulmonary embolism No evidence of any pneumonia We'll DC morphine will order a 2-D echo if negative no further workup is indicated   #1 suicide ideation polysubstance abuse continue olanzapine Zyprexa, psychiatric consult, it is recommended the patient be transferred to acute psychiatric  hospital whenever bed is available. At this time the patient appears to be medically stable.   #2 tachycardia in the setting of fever either secondary to alcohol withdrawal versus  cocaine abuse versus aspiration pneumonia, no pulmonary embolism on CT, empiric Levaquin, blood cultures , no further workup is indicated at this time  #3 history of seizure disorder subtherapeutic valproic acid level, doubt that patient has been compliant , repeat levels today  #4 somnolent, likely secondary to Phenergan, Ativan, gabapentin, morphine, Benadryl these medications have been discontinued   Transfer to behavioral health tomorrow morning    LOS: 3 days   [facility] 11/15/2011, 3:13 PM

## 2011-11-15 NOTE — Consult Note (Addendum)
Reason for Consult:Suicidal Overdose, Detox Referring Physician: Dr. Elsie Stain [name] is an 31 y.o. [name].  HPI: This patient with a history of depression now presents following an overdose. Per EMS/police the patient met first responders at the door, indicated that he take a significant amount of trazodone and then became obtunded. Per EMS the patient was hemodynamically stable en route. EMS placed a nasal trumpet. EMS also provided 2 doses of Narcan, without noted change in the patient's condition.  On ED arrival, the patient awakens to sternal rub, but is somnolent. He does not answer any questions appropriately. Level V Caveat  I counted the patient's pills, and it seems as though 20-30 pills of Trazadone may have been ingested.  AXIS I Major Depressive Disorder, recurrent Severe with overdose, Alcohol dependence AXIS II Deferred AXIS III Past Medical History  Diagnosis Date  . Seizures   . Hypertension   . Depression   . Pancreatitis   . Liver cirrhosis   . Coronary artery disease   . Angina   . Shortness of breath   . Headache     Past Surgical History  Procedure Date  . Chest surgery   . Left leg surgery   . Mastectomy   AXIS IV  Poor impulse control, medical/mental health conditions AXIS V  GAF  43   Family History  Problem Relation Age of Onset  . Stroke Other     Social History:  reports that he has been smoking Cigarettes.  He has a 1 pack-year smoking history. He has never used smokeless tobacco. He reports that he drinks about 1.2 ounces of alcohol per week. He reports that he uses illicit drugs (Cocaine) about once per week.  Allergies:  Allergies  Allergen Reactions  . Morphine Itching  . Penicillins Swelling    Medications: I have reviewed the patient's current medications.  Results for orders placed during the hospital encounter of 11/12/11 (from the past 48 hour(s))  GLUCOSE, CAPILLARY     Status: Abnormal   Collection Time   11/14/11  7:45 AM       Component Value Range Comment   Glucose-Capillary 150 (*) 70 - 99 (mg/dL)    Comment 1 Notify RN      Comment 2 Documented in Chart     CARDIAC PANEL(CRET KIN+CKTOT+MB+TROPI)     Status: Abnormal   Collection Time   11/14/11  9:00 PM      Component Value Range Comment   Total CK 304 (*) 7 - 232 (U/L)    CK, MB 1.9  0.3 - 4.0 (ng/mL)    Troponin I <0.30  <0.30 (ng/mL)    Relative Index 0.6  0.0 - 2.5    CARDIAC PANEL(CRET KIN+CKTOT+MB+TROPI)     Status: Abnormal   Collection Time   11/15/11  4:24 AM      Component Value Range Comment   Total CK 266 (*) 7 - 232 (U/L)    CK, MB 2.2  0.3 - 4.0 (ng/mL)    Troponin I <0.30  <0.30 (ng/mL)    Relative Index 0.8  0.0 - 2.5    GLUCOSE, CAPILLARY     Status: Normal   Collection Time   11/15/11  7:40 AM      Component Value Range Comment   Glucose-Capillary 96  70 - 99 (mg/dL)    Comment 1 Notify RN     CARDIAC PANEL(CRET KIN+CKTOT+MB+TROPI)     Status: Normal   Collection Time   11/15/11  12:18 PM      Component Value Range Comment   Total CK 221  7 - 232 (U/L)    CK, MB 1.9  0.3 - 4.0 (ng/mL)    Troponin I <0.30  <0.30 (ng/mL)    Relative Index 0.9  0.0 - 2.5      No results found.  Review of Systems  Unable to perform ROS: other   Blood pressure 134/91, pulse 82, temperature 98.5 F (36.9 C), temperature source Oral, resp. rate 18, height 5\' 6"  (1.676 m), weight 79.833 kg (176 lb), SpO2 98.00%. Physical Exam  Assessment/Plan: Chart reviewed  Discussed with Psych CSW Pt is in bed awake, alert and has spontaneous speech.  He says he is just tired of drinking and is unable to stop.  He says he drinks 2 40 oz beers/day. He took the overdose of trazodone and called 911.  He has been drinking since teen years.  He has been to rehab programs and wants to go to a very long rehab program.  28 day program provided 30 days sobriety after discharge.  He says he knows he has to change his community and admits last time he went back to familiar  places and relapsed.  He denies Si HI today.  He denies hx of AH/VH.  He is aletr oriented to situation person and place.  He has capacity to determine his plan of care.  Mental Status Evaluation: Appearance:  age appropriate  Behavior:  normal  Speech:  normal pitch and normal volume  Mood:  depressed  Affect:  blunted and mood-congruent  Thought Process:  desparate  Thought Content:  Focused on inability to stop drinking  Sensorium:  person, place and situation  Cognition:  grossly intact  Insight:  fair  Judgment:  fair   RECOMMENDATION 1. Pt apparently is non-adherent to medication plan and is seeking rehab. 2. Transfer pt to long term rehab program or outpt rehab program, whichever is available when medically stable. 3. Provide pt with psychiatric outpatient referral as follow up to his rehab program. 4. Agree with Rxs: Zoloft, Zyprexa, thiamine, folic acid 5. No further psychiatric needs are noted.  MD Psychiatrist signs off   [name] 11/15/2011, 6:21 PM

## 2011-11-15 NOTE — Progress Notes (Signed)
Patient continues to complain of a burning chest pain putting hand in the mid nipple line.  Questioned regarding acid reflux/indigestion which he denies.  Encouraged to ambulate in room with sitter's assistance and sit up for awhile to see if that helps relieve his pain.

## 2011-11-15 NOTE — Progress Notes (Signed)
Faxed MD's note from today to [facility].  Notified [facility] that Pt medically stable.  CSW to continue to follow.  [name], LCSWA Clinical Social Work [phone]

## 2011-11-15 NOTE — Progress Notes (Signed)
Patient is complaining of chest pain 7/10.  Percocet was recently given but did not control the pain.  Patient is requesting IV Morphine, but is requesting benadryl to go with it.  Dr. Susie Cassette paged and order was given to DC the Morphine.

## 2011-11-15 NOTE — Progress Notes (Signed)
CARE MANAGE MENT UTILIZATION REVIEW NOTE 11/15/2011     Patient:  [name], [name]   Account Number:  [id number]  Documented by:  Bjorn Loser Laquenta Whitsell   Per Ur Regulation Reviewed for med. necessity/level of care/duration of stay

## 2011-11-15 NOTE — Progress Notes (Signed)
Per MD, Pt medically cleared for d/c.  MD to note this in her progress note.  CSW to continue to follow.[name], LCSWA Clinical Social Work [phone]

## 2011-11-16 LAB — GLUCOSE, CAPILLARY: Glucose-Capillary: 81 mg/dL (ref 70–99)

## 2011-11-16 LAB — BASIC METABOLIC PANEL
BUN: 4 mg/dL — ABNORMAL LOW (ref 6–23)
Chloride: 105 mEq/L (ref 96–112)
GFR calc Af Amer: >90 mL/min (ref >90)
GFR calc non Af Amer: >90 mL/min (ref >90)
Potassium: 3.3 mEq/L — ABNORMAL LOW (ref 3.5–5.1)
Sodium: 139 mEq/L (ref 135–145)

## 2011-11-16 LAB — VALPROIC ACID LEVEL: Valproic Acid Lvl: 72.4 ug/mL (ref 50.0–100.0)

## 2011-11-16 MED ORDER — LEVOFLOXACIN 500 MG PO TABS: 500.0000 mg | ORAL_TABLET | Freq: Every day | ORAL | Status: AC

## 2011-11-16 MED ORDER — POTASSIUM CHLORIDE CRYS ER 20 MEQ PO TBCR
40.0000 meq | EXTENDED_RELEASE_TABLET | Freq: Once | ORAL | Status: AC
  Administered 2011-11-16: 40 meq via ORAL
  Filled 2011-11-16: qty 2

## 2011-11-16 MED ORDER — MAGNESIUM SULFATE 40 MG/ML IJ SOLN
4.0000 g | Freq: Once | INTRAMUSCULAR | Status: AC
  Administered 2011-11-16: 4 g via INTRAVENOUS
  Filled 2011-11-16: qty 100

## 2011-11-16 MED ORDER — THIAMINE HCL 100 MG PO TABS: 100.0000 mg | ORAL_TABLET | Freq: Every day | ORAL | Status: DC

## 2011-11-16 NOTE — Progress Notes (Signed)
Several t/cs to [facility].  Spoke with [place].  Placed on hold for a lengthy period of time.    T/c from [facility].  [facility] currently reviewing Pt for admission.  Met with Pt re: d/c plans.  Pt reports that he is still having SI.  Pt understands that he is being reviewed at [facility] and [facility].  CSW thanked Pt for his time.  Spoke with Christiane Ha at [facility].  [facility] has questions re: Pt's seizure d/o and if valporic acid levels are therapeutic.  Reviewed chart.  Last valporic acid levels take on 4/27.  Notified MD.  CSW to continue to follow.  [name], LCSWA Clinical Social Work [phone]

## 2011-11-16 NOTE — Progress Notes (Signed)
Resident discharged in stable condition to [facility].  Discharge instructions given to patient with understanding.  Report called in to nurse at [facility].
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
## 2011-11-16 NOTE — Progress Notes (Signed)
Faxed d/c summary and valporic acid levels to [facility] [address]  Notified Christiane Ha of impending fax.  Per Christiane Ha, once above info is received, Pt will be staffed with an MD and acceptance is likely.  CSW to continue to follow.  [name], LCSWA Clinical Social Work [phone]

## 2011-11-16 NOTE — Progress Notes (Addendum)
Pt has been accepted by [facility].  Per Christiane Ha, facility needs no additional information. Pt to be admited to Dr. Dawayne Patricia care.  Pt's mom at bedside and Pt gave CSW permission to speak in front of her.  Notified Pt, RN and Care Coordinator, Aram Beecham.  RN provided with receiving MD's name and # to give report.  Answered Pt and mom's questions.  Arranged for transportation.  Pt to be d/c.  [name], LCSWA Clinical Social Work [phone]

## 2011-11-16 NOTE — Progress Notes (Signed)
  Echocardiogram 2D Echocardiogram has been performed.  Lalitha Ilyas L 11/16/2011, 9:51 AM

## 2011-11-19 LAB — CULTURE, BLOOD (ROUTINE X 2)
Culture  Setup Time: 201304271418
Culture: NO GROWTH

## 2011-11-26 IMAGING — CR DG ABDOMEN ACUTE W/ 1V CHEST
4 series · 4 of 4 positions shown · non-contrast
Comparison: 12/17/2007

CLINICAL DATA: Pain

ACUTE ABDOMEN SERIES (ABDOMEN 2 VIEW & CHEST 1 VIEW)

[w chest pa]
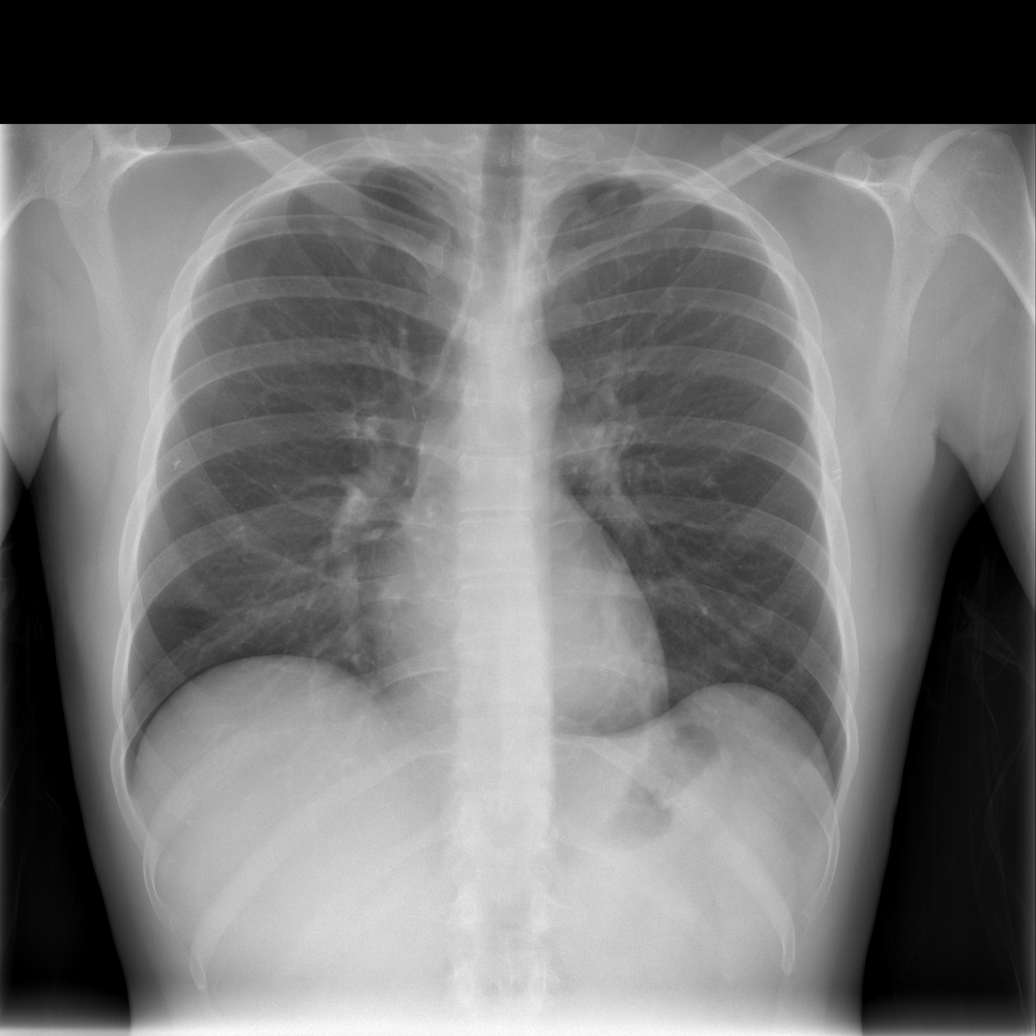

[w abdomen upright *]
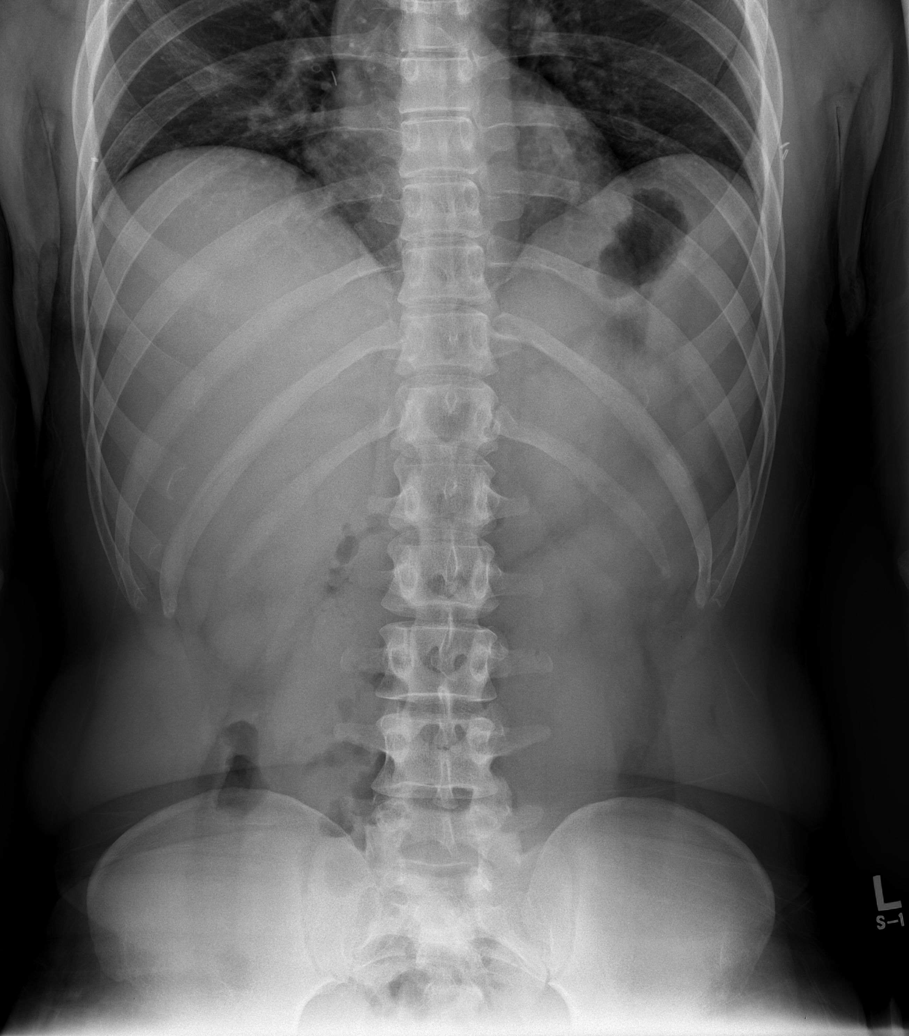

[t abdomen supine (1 of 2)]
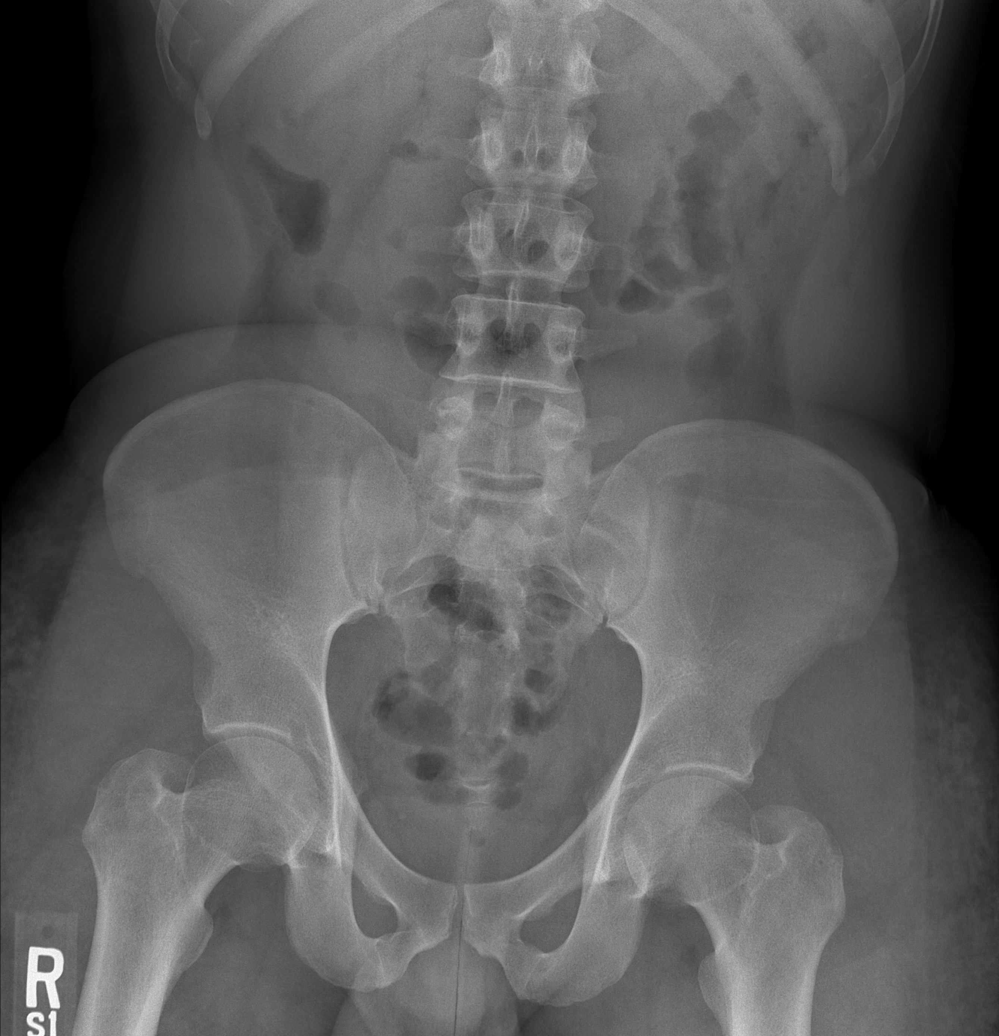

[t abdomen supine (2 of 2)]
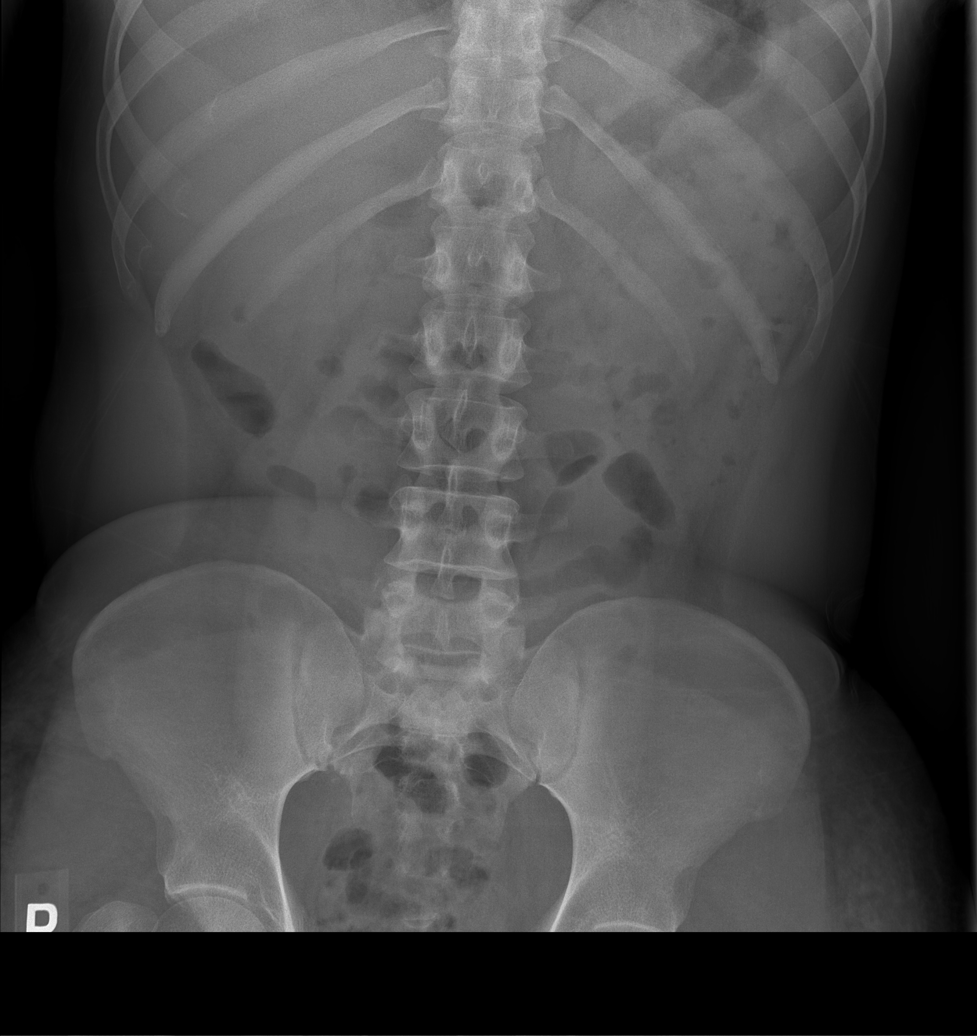

[4 of 4 positions shown; findings below may reference images not displayed]

FINDINGS: Cardiomediastinal silhouette is stable.  No acute
infiltrate or pleural effusion.  No pulmonary edema.  There is a
nonspecific nonobstructive bowel gas pattern.  No free abdominal
air.
IMPRESSION: No acute disease.  Nonspecific nonobstructive bowel gas pattern.
No free abdominal air.

## 2011-11-29 ENCOUNTER — Encounter: Payer: Self-pay | Admitting: Vascular Surgery

## 2011-11-29 ENCOUNTER — Encounter (HOSPITAL_COMMUNITY): Payer: Self-pay | Admitting: Emergency Medicine

## 2011-11-29 ENCOUNTER — Emergency Department (HOSPITAL_COMMUNITY)
Admission: EM | Admit: 2011-11-29 | Discharge: 2011-11-29 | Disposition: A | Discharge status: Home / Self Care | Payer: Self-pay | Attending: Emergency Medicine | Admitting: Emergency Medicine

## 2011-11-29 DIAGNOSIS — Z79899 Other long term (current) drug therapy: Secondary | ICD-10-CM | POA: Insufficient documentation

## 2011-11-29 DIAGNOSIS — F17200 Nicotine dependence, unspecified, uncomplicated: Secondary | ICD-10-CM | POA: Insufficient documentation

## 2011-11-29 DIAGNOSIS — I10 Essential (primary) hypertension: Secondary | ICD-10-CM | POA: Insufficient documentation

## 2011-11-29 DIAGNOSIS — I2510 Atherosclerotic heart disease of native coronary artery without angina pectoris: Secondary | ICD-10-CM | POA: Insufficient documentation

## 2011-11-29 DIAGNOSIS — L03011 Cellulitis of right finger: Secondary | ICD-10-CM

## 2011-11-29 DIAGNOSIS — L03019 Cellulitis of unspecified finger: Secondary | ICD-10-CM | POA: Insufficient documentation

## 2011-11-29 MED ORDER — SULFAMETHOXAZOLE-TRIMETHOPRIM 800-160 MG PO TABS: 2.0000 | ORAL_TABLET | Freq: Two times a day (BID) | ORAL | Status: AC

## 2011-11-29 MED ORDER — CEPHALEXIN 500 MG PO CAPS: 500.0000 mg | ORAL_CAPSULE | Freq: Four times a day (QID) | ORAL | Status: AC

## 2011-11-29 NOTE — ED Provider Notes (Signed)
History     CSN: [id number]  Arrival date & time 11/29/11  1742   First MD Initiated Contact with Patient 11/29/11 1815      Chief Complaint  Patient presents with  . Hand Pain    (Consider location/radiation/quality/duration/timing/severity/associated sxs/prior treatment) Patient is a 31 y.o. male presenting with hand pain. The history is provided by the patient.  Hand Pain This is a new problem. The current episode started in the past 7 days. The problem occurs constantly. The problem has been gradually worsening. Pertinent negatives include no chills, fever, numbness or weakness.  Pt states his right thumb has been painful and swollen around his finger nail for last 2 days. Today woke up with increased swelling and white area. States very tender. Has not tried any medications for it.   Past Medical History  Diagnosis Date  . Seizures   . Hypertension   . Depression   . Pancreatitis   . Liver cirrhosis   . Coronary artery disease   . Angina   . Shortness of breath   . Headache     Past Surgical History  Procedure Date  . Chest surgery   . Left leg surgery   . Mastectomy     Family History  Problem Relation Age of Onset  . Stroke Other     History  Substance Use Topics  . Smoking status: Current Everyday Smoker -- 0.2 packs/day for 4 years    Types: Cigarettes  . Smokeless tobacco: Never Used  . Alcohol Use: No      Review of Systems  Constitutional: Negative for fever and chills.  Respiratory: Negative.   Cardiovascular: Negative.   Musculoskeletal:       Finger pain  Skin: Positive for wound.  Neurological: Negative for weakness and numbness.    Allergies  Morphine and Penicillins  Home Medications   Current Outpatient Rx  Name Route Sig Dispense Refill  . ACETAMINOPHEN 500 MG PO TABS Oral Take 500 mg by mouth every 6 (six) hours as needed. For pain relief    . AMLODIPINE BESYLATE 10 MG PO TABS Oral Take 1 tablet (10 mg total) by mouth  daily. 30 tablet 0  . ATORVASTATIN CALCIUM 40 MG PO TABS Oral Take 40 mg by mouth daily.    Marland Kitchen DIVALPROEX SODIUM ER 500 MG PO TB24 Oral Take 1 tablet (500 mg total) by mouth 2 (two) times daily in the am and at bedtime.. 90 tablet 0  . GABAPENTIN 300 MG PO CAPS Oral Take 1-2 capsules (300-600 mg total) by mouth 3 (three) times daily. Take 1 capsule by mouth twice daily and then, take 2 capsules by mouth at bedtime 120 capsule 0  . METOPROLOL SUCCINATE ER 50 MG PO TB24 Oral Take 1 tablet (50 mg total) by mouth daily. 30 tablet 0  . OLANZAPINE 5 MG PO TABS Oral Take 1 tablet (5 mg total) by mouth at bedtime. 30 tablet 0  . SERTRALINE HCL 50 MG PO TABS Oral Take 1 tablet (50 mg total) by mouth daily. 30 tablet 0  . THIAMINE HCL 100 MG PO TABS Oral Take 1 tablet (100 mg total) by mouth daily. 30 tablet 0  . TRAZODONE HCL 100 MG PO TABS Oral Take 300 mg by mouth at bedtime.      BP 128/80  Pulse 89  Temp(Src) 98.9 F (37.2 C) (Oral)  Resp 20  SpO2 97%  Physical Exam  Nursing note and vitals reviewed. Constitutional: He is  oriented to person, place, and time. He appears well-developed and well-nourished. No distress.  HENT:  Head: Normocephalic.  Eyes: Conjunctivae are normal.  Neck: Neck supple.  Cardiovascular: Normal rate, regular rhythm and normal heart sounds.   Pulmonary/Chest: Effort normal and breath sounds normal. No respiratory distress.  Musculoskeletal: Normal range of motion.       Swelling to the distal right thumb. There is subcutaneous purulenr drainage to the distal phalanx, under neath the finger nail. Tender to palpation.  Neurological: He is alert and oriented to person, place, and time.  Skin: Skin is warm and dry. There is erythema.  Psychiatric: He has a normal mood and affect.    ED Course  Procedures (including critical care time)  PT with small infected area to the distal right thumb, under the finger nail. I cleaned finger with iodine and alcohol wipes. I used  Scalpel and made a small incision just under the nail, Large purulent drainage. Will soak figner in warm soapy water. Plan to start on antibiotics, follow up with hand if worsening  1. Paronychia of right thumb       MDM          Lottie Mussel, PA 11/30/11 [phone]

## 2011-11-29 NOTE — ED Notes (Signed)
Pt. With swollen right thumb above fingernail which started two days ago and has progressively worsened. Pt. Rates pain as a 7.  No injury reported.

## 2011-11-29 NOTE — ED Notes (Signed)
Wrapped finger in gauze dressing per PA order.

## 2011-11-29 NOTE — ED Notes (Signed)
Pt. Soaking his finger in warm soapy water after PA lanced it.

## 2011-11-29 NOTE — Discharge Instructions (Signed)
Keep your finger elevated at home. Do warm water with soap soaks, several times a day. Take keflex and bactrim as prescribed until all gone for infection. Follow up with your doctor in 3 days for recheck. Follow up earlier with a hand specialist as referred if worsening.   Fingertip Infection When an infection is around the nail, it is called a paronychia. When it appears over the tip of the finger, it is called a felon. These infections are due to minor injuries or cracks in the skin. If they are not treated properly, they can lead to bone infection and permanent damage to the fingernail. Incision and drainage is necessary if a pus pocket (an abscess) has formed. Antibiotics and pain medicine may also be needed. Keep your hand elevated for the next 2-3 days to reduce swelling and pain. If a pack was placed in the abscess, it should be removed in 1-2 days by your caregiver. Soak the finger in warm water for 20 minutes 4 times daily to help promote drainage. Keep the hands as dry as possible. Wear protective gloves with cotton liners. See your caregiver for follow-up care as recommended.  HOME CARE INSTRUCTIONS   Keep wound clean, dry and dressed as suggested by your caregiver.   Soak in warm salt water for fifteen minutes, four times per day for bacterial infections.   Your caregiver will prescribe an antibiotic if a bacterial infection is suspected. Take antibiotics as directed and finish the prescription, even if the problem appears to be improving before the medicine is gone.   Only take over-the-counter or prescription medicines for pain, discomfort, or fever as directed by your caregiver.  SEEK IMMEDIATE MEDICAL CARE IF:  There is redness, swelling, or increasing pain in the wound.   Pus or any other unusual drainage is coming from the wound.   An unexplained oral temperature above 102 F (38.9 C) develops.   You notice a foul smell coming from the wound or dressing.  MAKE SURE YOU:    Understand these instructions.   Monitor your condition.   Contact your caregiver if you are getting worse or not improving.  Document Released: 08/12/2004 Document Revised: 06/24/2011 Document Reviewed: 08/08/2008 [facility] Patient Information 2012 [place], [place].

## 2011-11-30 ENCOUNTER — Ambulatory Visit: Payer: Self-pay | Admitting: Vascular Surgery

## 2011-11-30 NOTE — ED Provider Notes (Signed)
Medical screening examination/treatment/procedure(s) were performed by non-physician practitioner and as supervising physician I was immediately available for consultation/collaboration.  Rhena Glace, MD 11/30/11 1839 

## 2011-12-20 ENCOUNTER — Encounter: Payer: Self-pay | Admitting: Neurosurgery

## 2011-12-21 ENCOUNTER — Ambulatory Visit: Payer: Self-pay | Admitting: Neurosurgery

## 2012-01-13 ENCOUNTER — Encounter: Payer: Self-pay | Admitting: Neurosurgery

## 2012-01-14 ENCOUNTER — Encounter: Payer: Self-pay | Admitting: Neurosurgery

## 2012-01-14 ENCOUNTER — Ambulatory Visit (INDEPENDENT_AMBULATORY_CARE_PROVIDER_SITE_OTHER): Payer: Self-pay | Admitting: Neurosurgery

## 2012-01-14 ENCOUNTER — Ambulatory Visit (INDEPENDENT_AMBULATORY_CARE_PROVIDER_SITE_OTHER): Payer: PRIVATE HEALTH INSURANCE | Admitting: Vascular Surgery

## 2012-01-14 VITALS — BP 122/77 | HR 80 | Resp 14 | Ht 66.0 in | Wt 198.2 lb

## 2012-01-14 DIAGNOSIS — Z48812 Encounter for surgical aftercare following surgery on the circulatory system: Secondary | ICD-10-CM

## 2012-01-14 DIAGNOSIS — I739 Peripheral vascular disease, unspecified: Secondary | ICD-10-CM

## 2012-01-14 DIAGNOSIS — I70219 Atherosclerosis of native arteries of extremities with intermittent claudication, unspecified extremity: Secondary | ICD-10-CM | POA: Insufficient documentation

## 2012-01-14 NOTE — Progress Notes (Signed)
ABI performed @ [facility] 01/14/2012

## 2012-01-14 NOTE — Progress Notes (Signed)
[facility] Office Note  CC: Annual surveillance lower extremity graft duplex and ABI Referring Physician: Early  History of Present Illness: 31 year old male patient of Dr. Arbie Cookey seen for annual surveillance status post a left AK popliteal to BK popliteal bypass graft April 2001. The patient states he does have some pain walking but he thinks is from the incisions and maybe scar tissue. He has normal ABIs and flow study. Patient denies any true claudication, rest pain or open ulcerations.  Past Medical History  Diagnosis Date  . Seizures   . Hypertension   . Depression   . Pancreatitis   . Liver cirrhosis   . Coronary artery disease   . Angina   . Shortness of breath   . Headache   . Peripheral vascular disease April 2011    Left Pop    ROS: [x]  Positive   [ ]  Denies    General: [ ]  Weight loss, [ ]  Fever, [ ]  chills Neurologic: [ ]  Dizziness, [ ]  Blackouts, [ ]  Seizure [ ]  Stroke, [ ]  "Mini stroke", [ ]  Slurred speech, [ ]  Temporary blindness; [ x] weakness in arms or legs, [ ]  Hoarseness Cardiac: [ ]  Chest pain/pressure, [ ]  Shortness of breath at rest [ ]  Shortness of breath with exertion, [ ]  Atrial fibrillation or irregular heartbeat Vascular: [x ] Pain in legs with walking, [ ]  Pain in legs at rest, [ ]  Pain in legs at night,  [ ]  Non-healing ulcer, [ ]  Blood clot in vein/DVT,   Pulmonary: [ ]  Home oxygen, [ ]  Productive cough, [ ]  Coughing up blood, [ ]  Asthma,  [ ]  Wheezing Musculoskeletal:  [ ]  Arthritis, [ ]  Low back pain, [ ]  Joint pain Hematologic: [ ]  Easy Bruising, [ ]  Anemia; [ ]  Hepatitis Gastrointestinal: [ ]  Blood in stool, [ ]  Gastroesophageal Reflux/heartburn, [ ]  Trouble swallowing Urinary: [ ]  chronic Kidney disease, [ ]  on HD - [ ]  MWF or [ ]  TTHS, [ ]  Burning with urination, [ ]  Difficulty urinating Skin: [ ]  Rashes, [ ]  Wounds Psychological: [ ]  Anxiety, [ ]  Depression   Social History History  Substance Use  Topics  . Smoking status: Current Everyday Smoker -- 0.2 packs/day for 4 years    Types: Cigarettes  . Smokeless tobacco: Never Used  . Alcohol Use: No    Family History Family History  Problem Relation Age of Onset  . Stroke Other     Allergies  Allergen Reactions  . Morphine Itching  . Penicillins Swelling    Current Outpatient Prescriptions  Medication Sig Dispense Refill  . acetaminophen (TYLENOL) 500 MG tablet Take 500 mg by mouth every 6 (six) hours as needed. For pain relief      . amLODipine (NORVASC) 10 MG tablet Take 1 tablet (10 mg total) by mouth daily.  30 tablet  0  . atorvastatin (LIPITOR) 40 MG tablet Take 40 mg by mouth daily.      . divalproex (DEPAKOTE ER) 500 MG 24 hr tablet Take 1 tablet (500 mg total) by mouth 2 (two) times daily in the am and at bedtime..  90 tablet  0  . gabapentin (NEURONTIN) 300 MG capsule Take 1-2 capsules (300-600 mg total) by mouth 3 (three) times daily. Take 1 capsule by mouth twice daily and then, take 2 capsules by mouth at bedtime  120 capsule  0  . metoprolol succinate (TOPROL-XL) 50 MG 24 hr tablet Take 1  tablet (50 mg total) by mouth daily.  30 tablet  0  . OLANZapine (ZYPREXA) 5 MG tablet Take 1 tablet (5 mg total) by mouth at bedtime.  30 tablet  0  . sertraline (ZOLOFT) 50 MG tablet Take 1 tablet (50 mg total) by mouth daily.  30 tablet  0  . thiamine 100 MG tablet Take 1 tablet (100 mg total) by mouth daily.  30 tablet  0  . traZODone (DESYREL) 100 MG tablet Take 300 mg by mouth at bedtime.      Marland Kitchen DISCONTD: pantoprazole (PROTONIX) 40 MG tablet Take 1 tablet (40 mg total) by mouth daily.  30 tablet  0    Physical Examination  Filed Vitals:   01/14/12 1513  BP: 122/77  Pulse: 80  Resp: 14    Body mass index is 31.99 kg/(m^2).  General:  WDWN in NAD Gait: Normal HEENT: WNL Eyes: Pupils equal Pulmonary: normal non-labored breathing , without Rales, rhonchi,  wheezing Cardiac: RRR, without  Murmurs, rubs or  gallops; No carotid bruits Abdomen: soft, NT, no masses Skin: no rashes, ulcers noted Vascular Exam/Pulses: Palpable PT and DP pulses bilaterally  Extremities without ischemic changes, no Gangrene , no cellulitis; no open wounds;  Musculoskeletal: no muscle wasting or atrophy  Neurologic: A&O X 3; Appropriate Affect ; SENSATION: normal; MOTOR FUNCTION:  moving all extremities equally. Speech is fluent/normal  Non-Invasive Vascular Imaging: ABIs today are 1.1 and triphasic on the right, 1.17 triphasic on the left, lower arterial duplex shows no visualized thrombus or plaque however there is dampened flow mid graft with a ratio of 2.77.  ASSESSMENT/PLAN: Patient to year status post left above the knee popliteal to below knee popliteal bypass graft and doing well. The patient return in one year for repeat studies. His questions were encouraged and answered.  Lauree Chandler ANP  Clinic M.D.: Imogene Burn

## 2012-01-14 NOTE — Progress Notes (Signed)
LLE arterial duplex performed @ [facility] 01/14/2012

## 2012-02-04 NOTE — Procedures (Unsigned)
BYPASS GRAFT EVALUATION  INDICATION:  Peripheral vascular disease  HISTORY: Diabetes:  No Cardiac:  No Hypertension:  Yes Smoking:  Previous Previous Surgery:  Left above knee to below knee popliteal artery bypass graft on 11/04/2009  SINGLE LEVEL ARTERIAL EXAM                              RIGHT              LEFT Brachial: Anterior tibial: Posterior tibial: Peroneal: Ankle/brachial index:        1.10               1.17  PREVIOUS ABI:  Date:  06/18/2011  RIGHT:  1.04  LEFT:  1.17  LOWER EXTREMITY BYPASS GRAFT DUPLEX EXAM:  DUPLEX:  No significant plaque visualized involving the left lower extremity arterial system.  IMPRESSION: 1. Patent left above knee to below knee popliteal artery bypass graft,     dampened triphasic flow present in the mid segment with a ratio of     2.7 which may suggest stenosis, however, no plaque or hyperplasia     is evident. 2. Bilateral ankle brachial indices are within normal range. 3. Unchanged since previous study on 06/18/2011.  ___________________________________________ Larina Earthly, M.D.  SH/MEDQ  D:  01/14/2012  T:  01/14/2012  Job:  [id number]

## 2012-03-03 IMAGING — CR DG TIBIA/FIBULA 2V*L*
4 series · 4 of 4 positions shown · non-contrast
Comparison: None.

CLINICAL DATA: Fell and twisted left leg

LEFT TIBIA AND FIBULA - 2 VIEW

[t tib/fib ap left (1 of 2)]
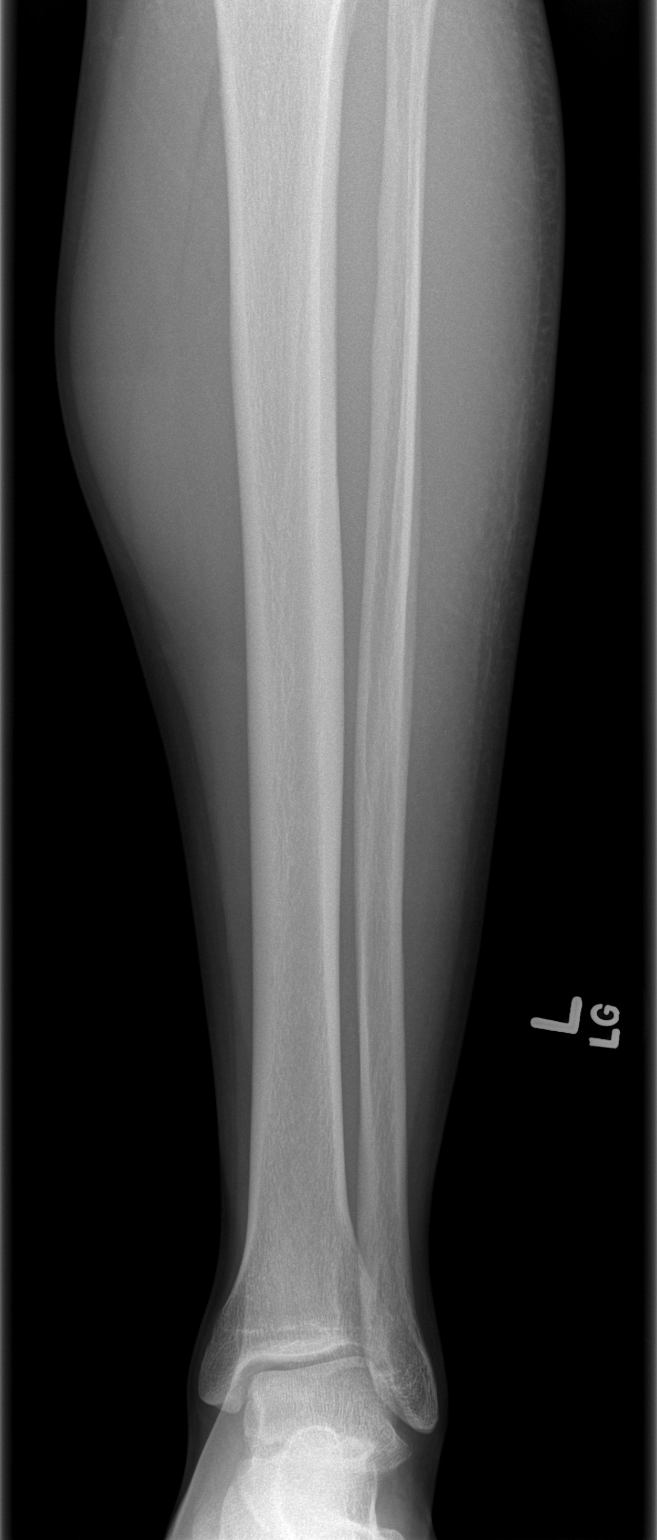

[t tib/fib ap left (2 of 2)]
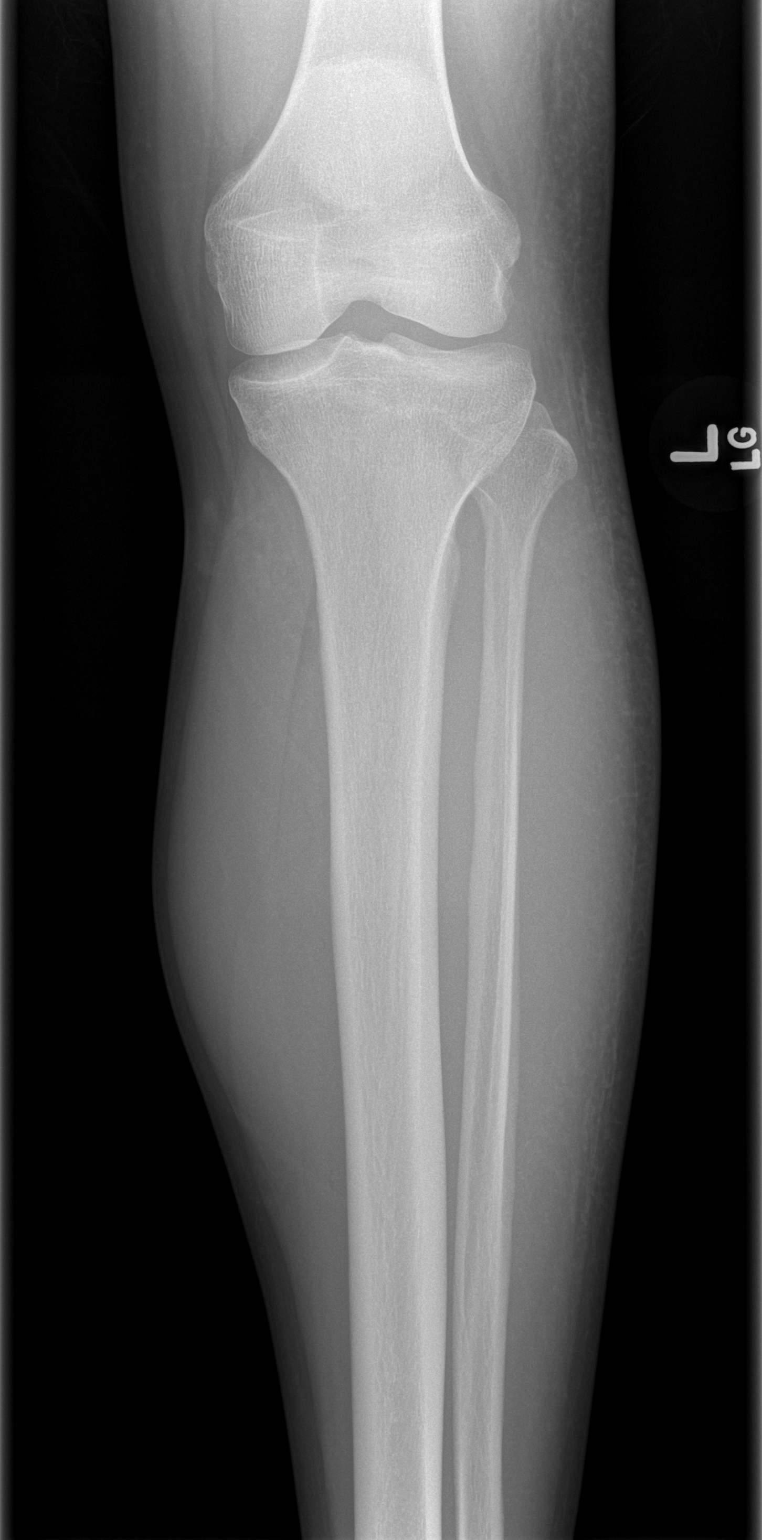

[t tib/fib lat left (1 of 2)]
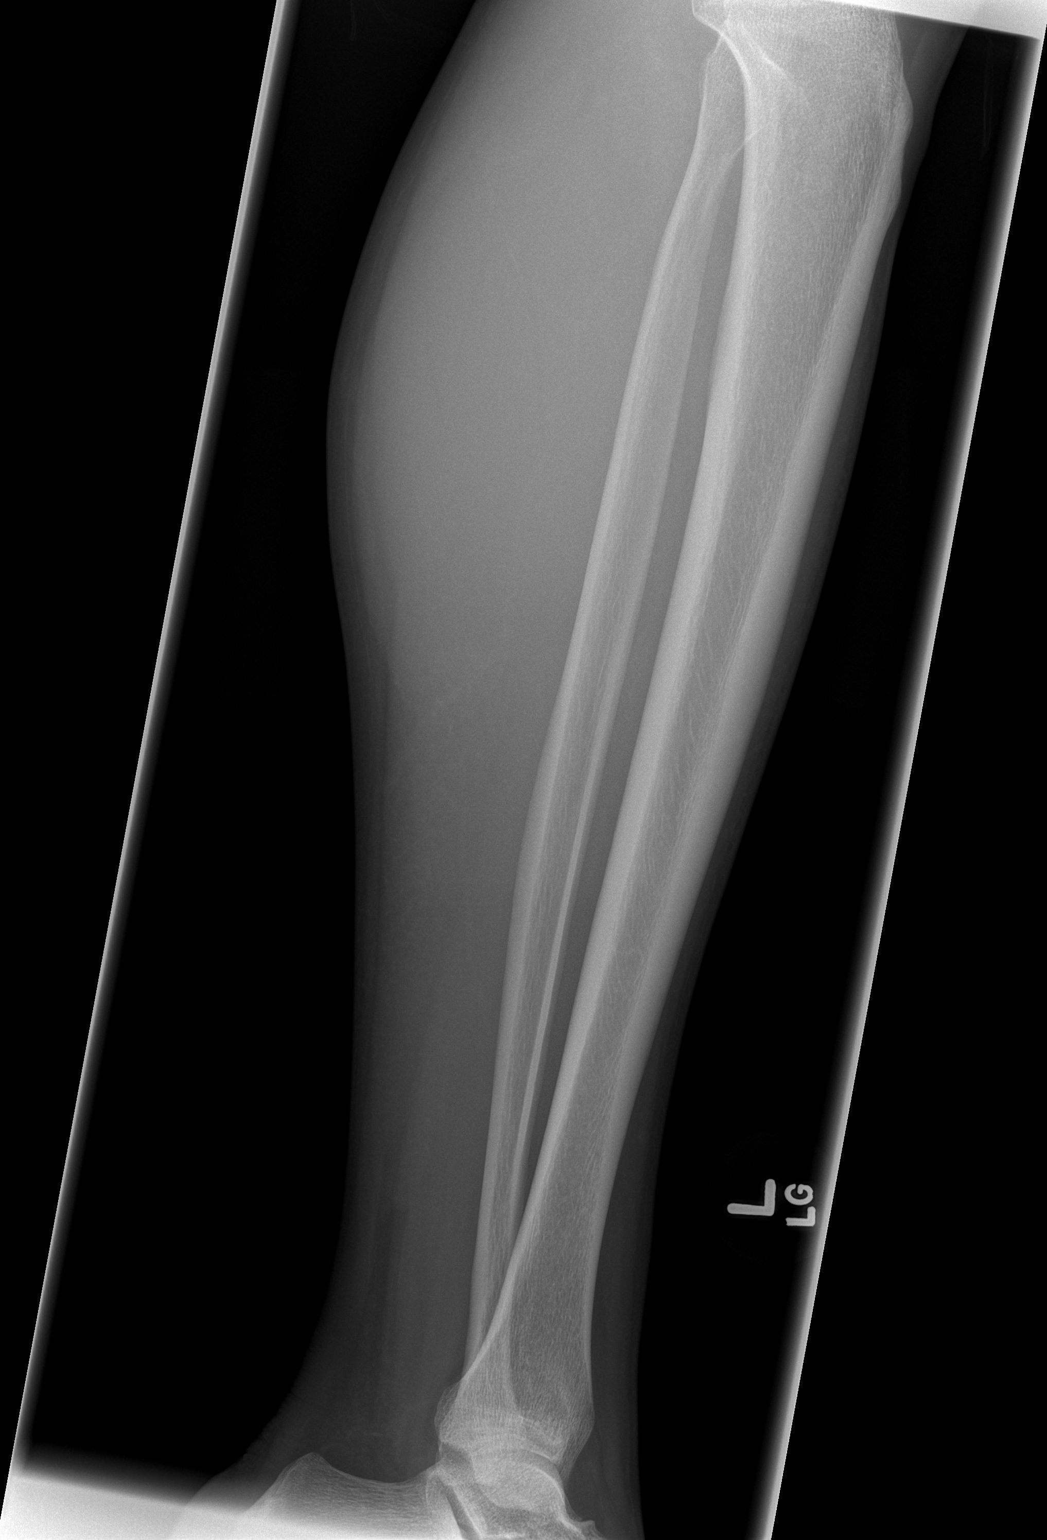

[t tib/fib lat left (2 of 2)]
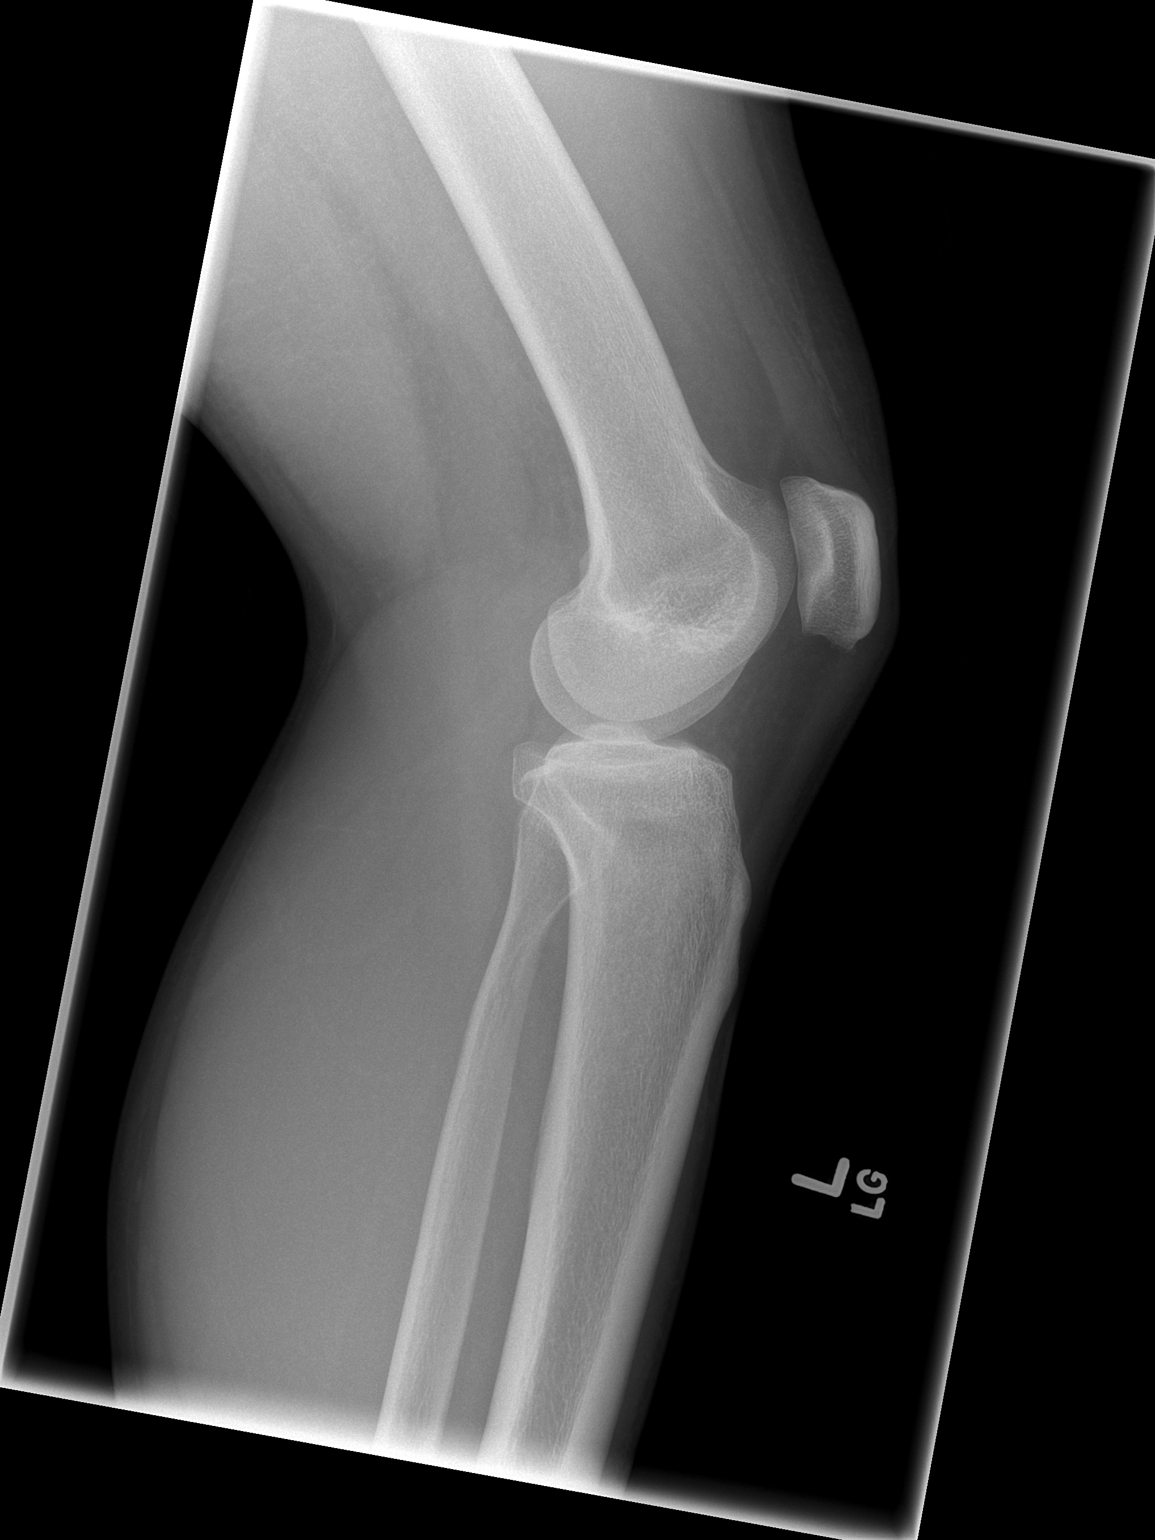

[4 of 4 positions shown; findings below may reference images not displayed]

FINDINGS: No evidence of fracture or dislocation of the left tibia
or fibula.
IMPRESSION: No evidence of fracture.

## 2012-03-03 IMAGING — CR DG TOE GREAT 2+V*L*
3 series · 3 of 3 positions shown · non-contrast
Comparison: None.

CLINICAL DATA: Fall with toe injury

LEFT TOE - 2+ VIEW

[t toes ap left]
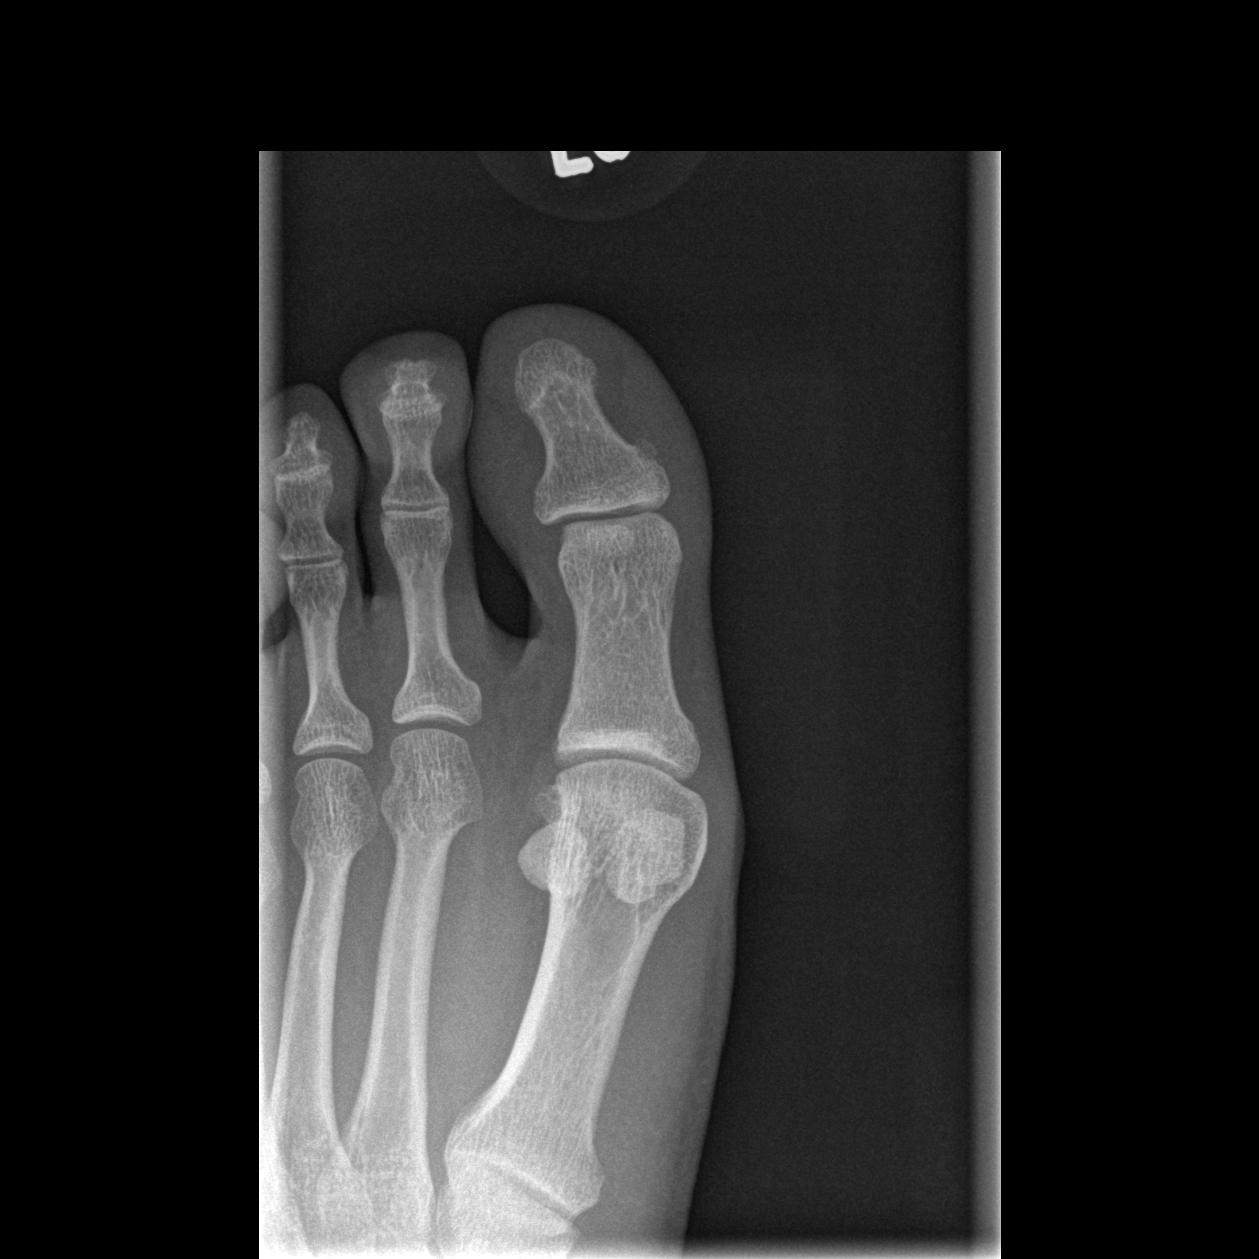

[t toes oblique left]
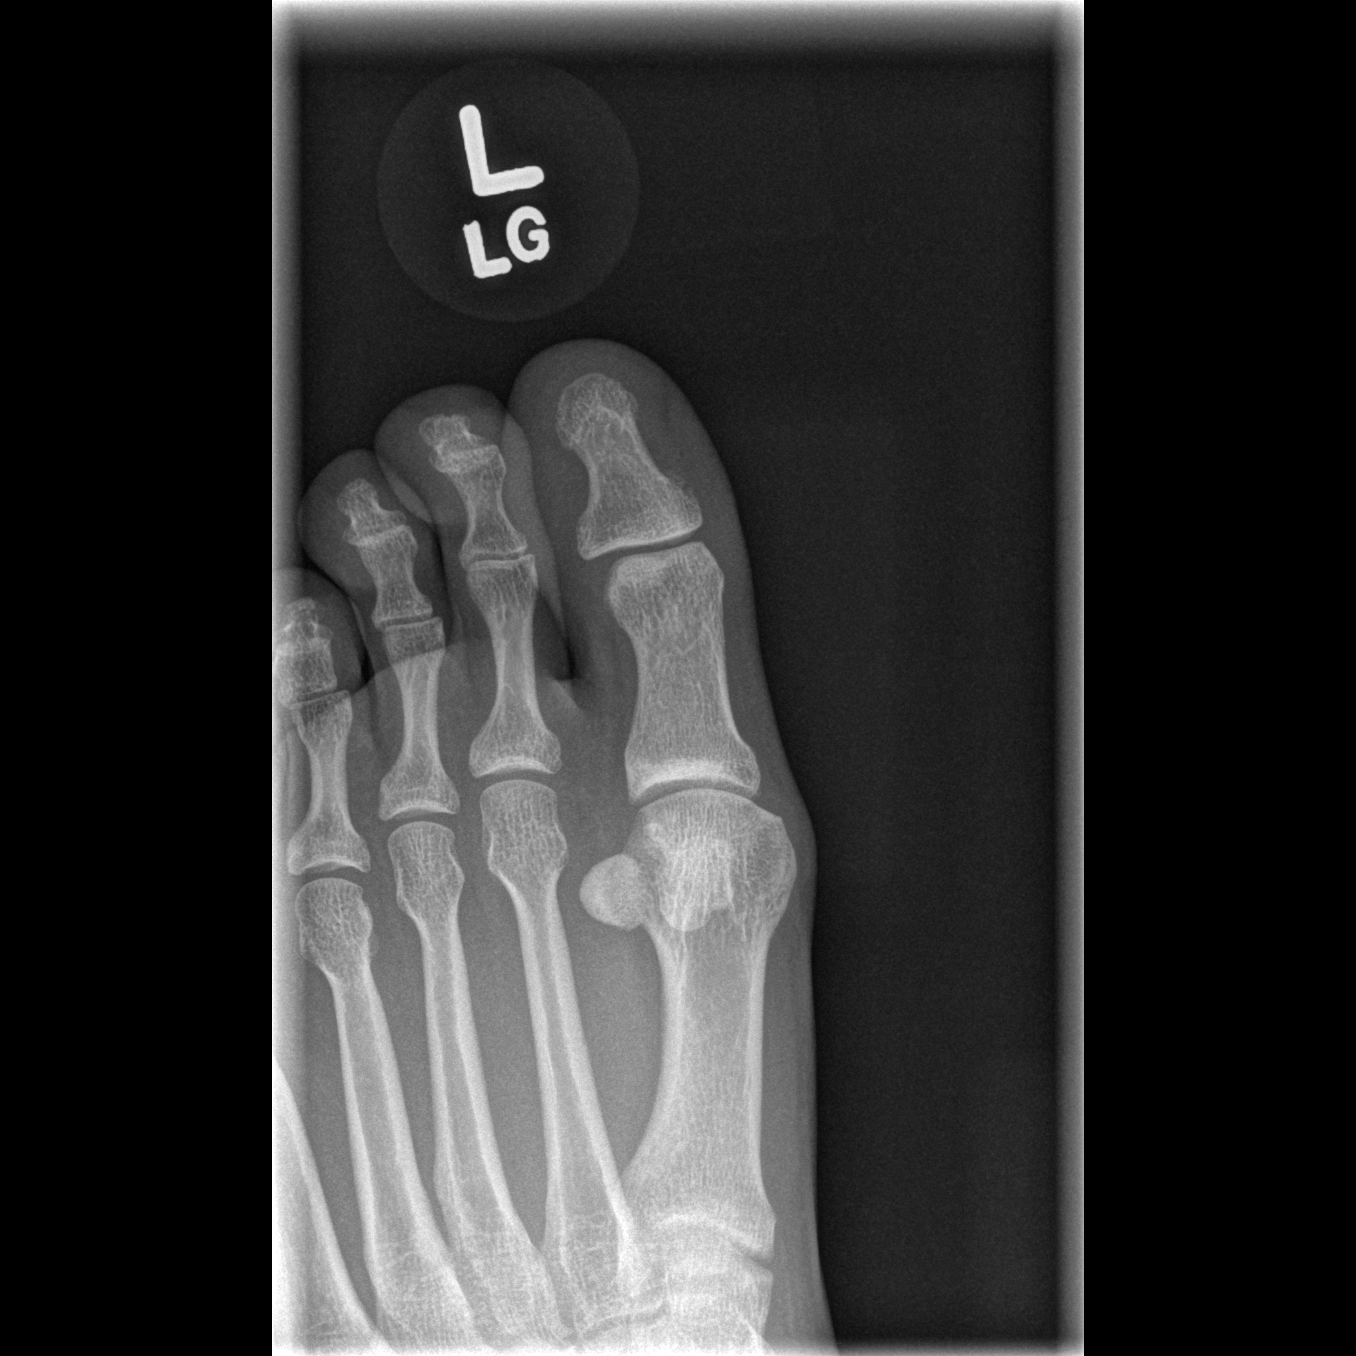

[t toes lateral left]
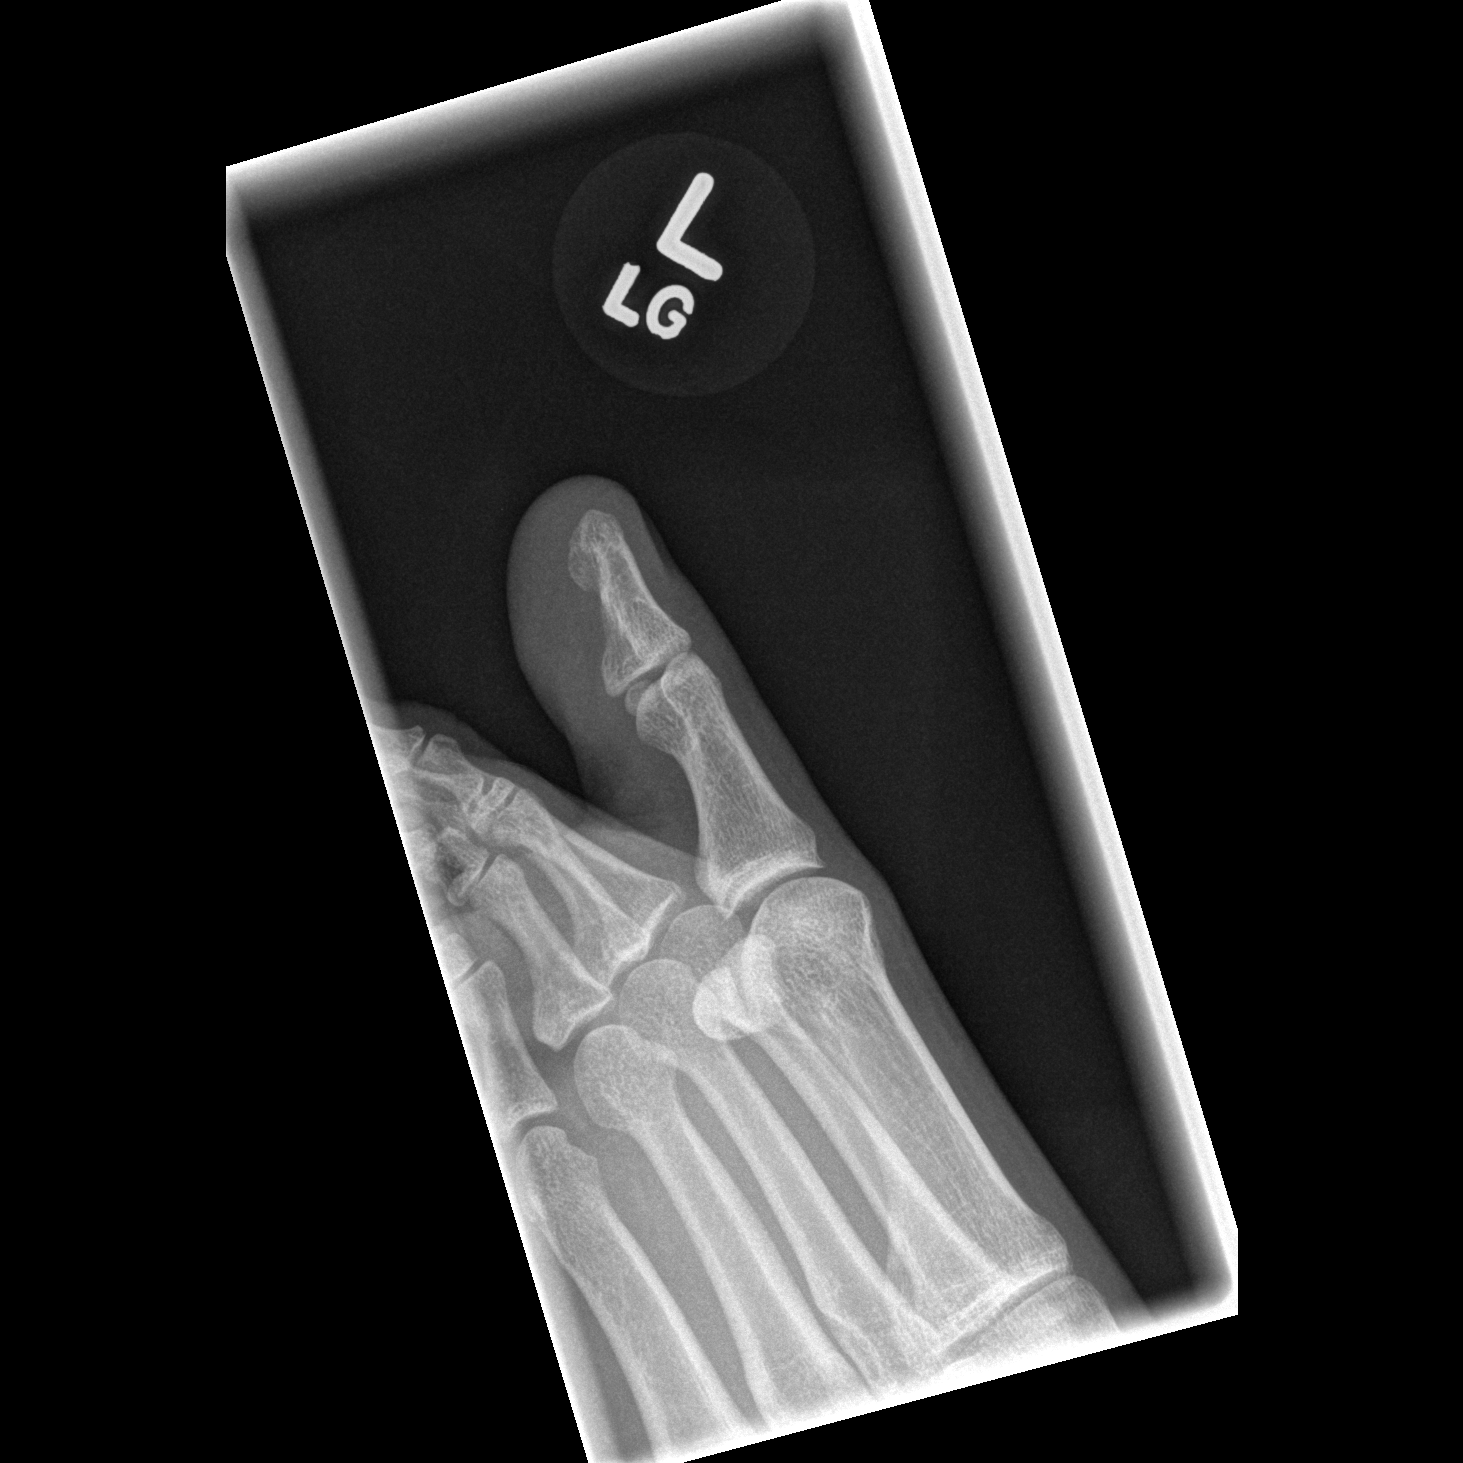

[3 of 3 positions shown; findings below may reference images not displayed]

FINDINGS: No evidence of fracture or dislocation of the left first
toe.  Joint spaces are normal.
IMPRESSION: No evidence of fracture.

## 2012-03-26 IMAGING — CT CT ANGIO AOBIFEM WO/W CM
1 of 15 series · 13 of 48 positions shown, 18 images · IV contrast (APPLIED)
Comparison: MR abdomen dated 05/25/2007

CLINICAL DATA: Calf pain

CT ANGIOGRAPHY OF ABDOMINAL AORTA WITH ILIOFEMORAL RUNOFF
TECHNIQUE: Multidetector CT imaging of the abdomen, pelvis and
lower extremities was performed using the standard protocol during
bolus administration of intravenous contrast.  Multiplanar CT image
reconstructions including MIPs were obtained to evaluate the
vascular anatomy.
Contrast:  150 ml Omnipaque 350

[Series 4: celiac to knee 2.0 b31f st · axial · 0.80mm/px · z∈[-728,+26]mm · 13 of 431 slices shown, 18 images]
[im 27/431  soft-tissue]
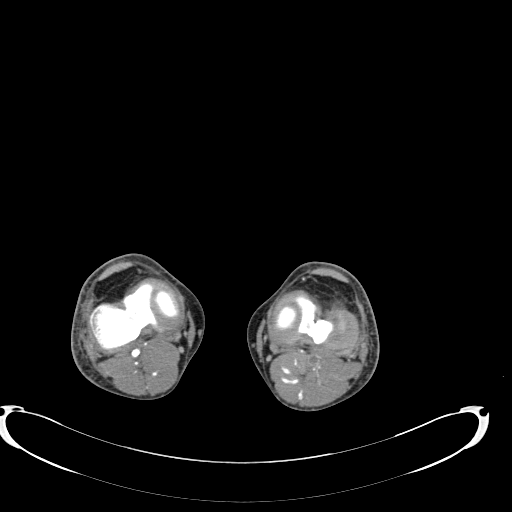
[im 27/431  bone]
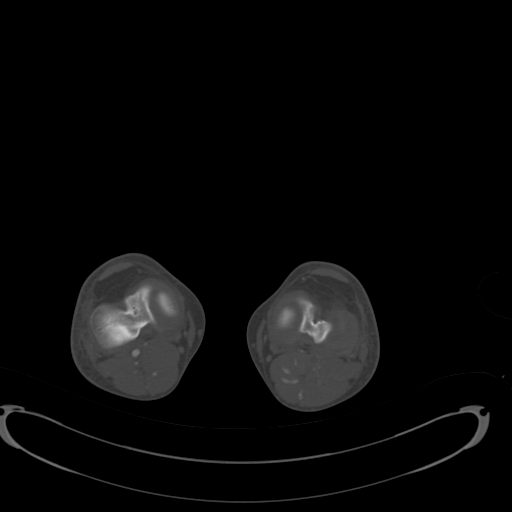
[im 54/431  soft-tissue]
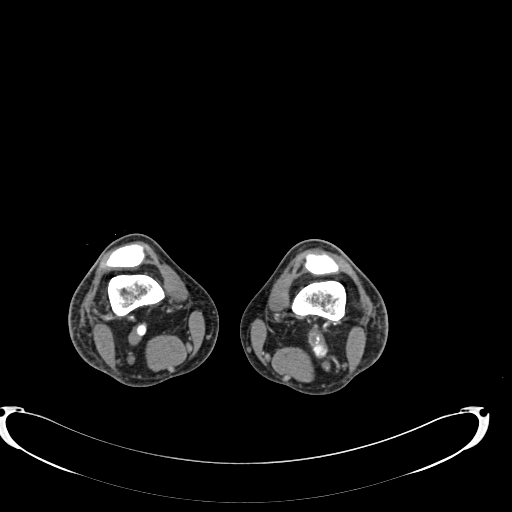
[im 108/431  soft-tissue]
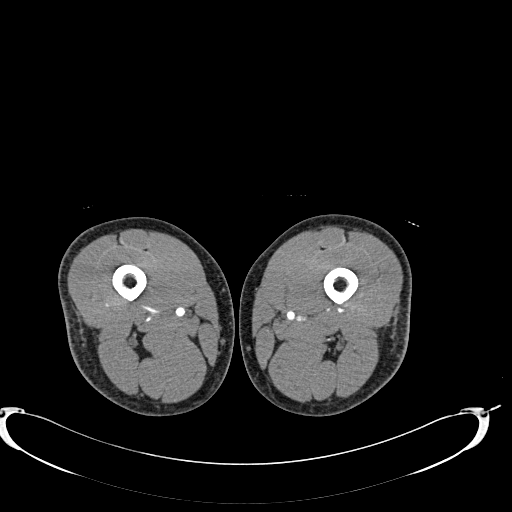
[im 135/431  soft-tissue]
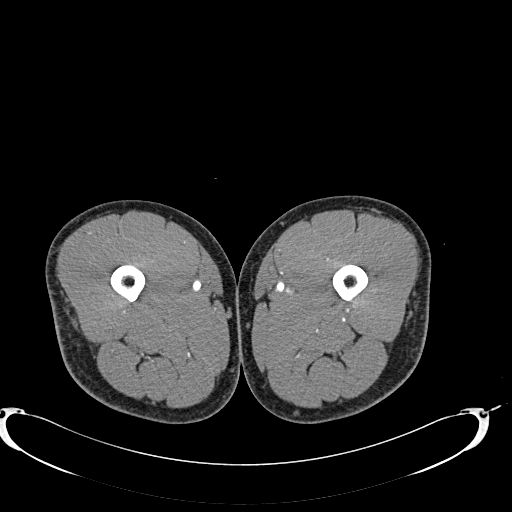
[im 162/431  soft-tissue]
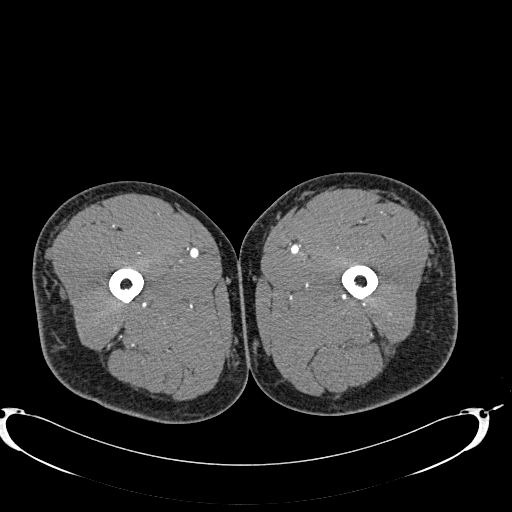
[im 189/431  soft-tissue]
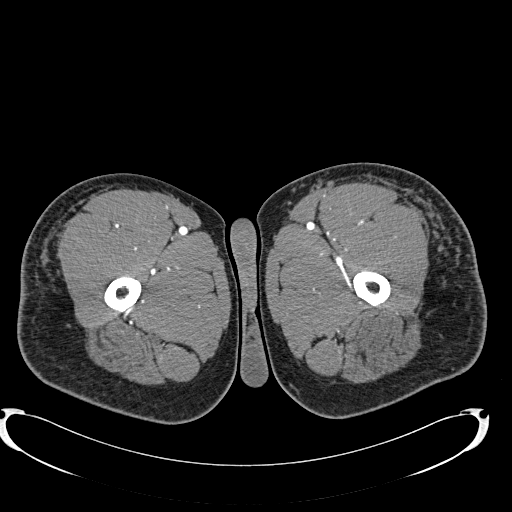
[im 242/431  soft-tissue]
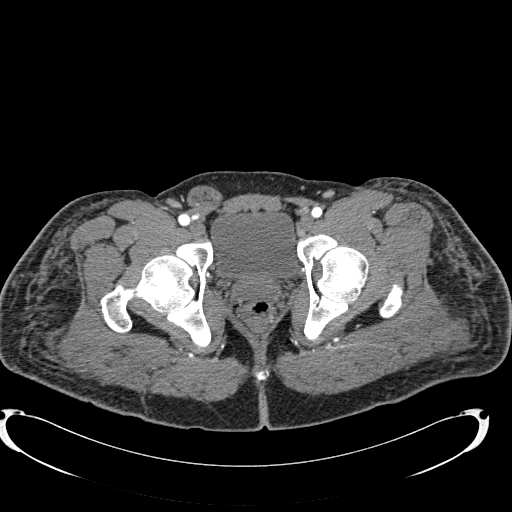
[im 269/431  soft-tissue]
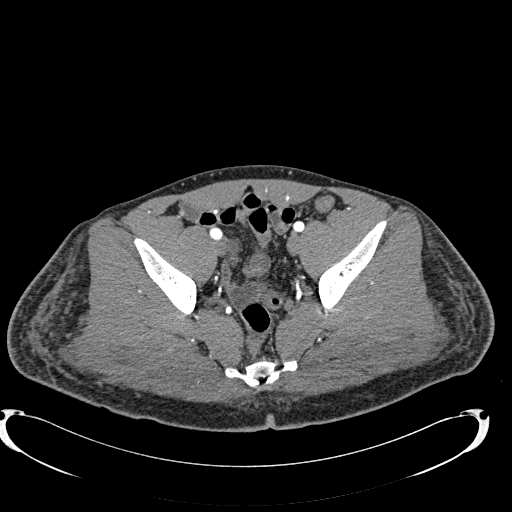
[im 296/431  soft-tissue]
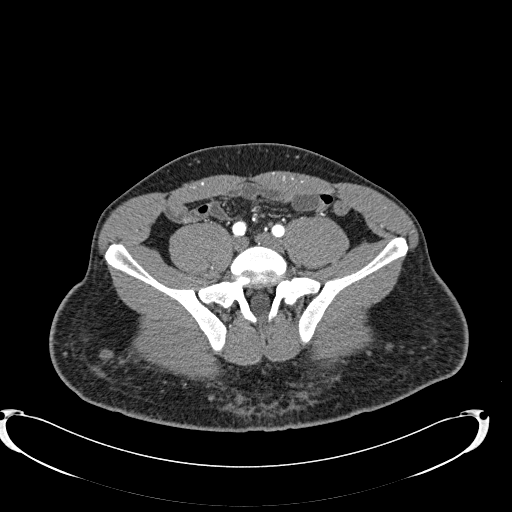
[im 296/431  bone]
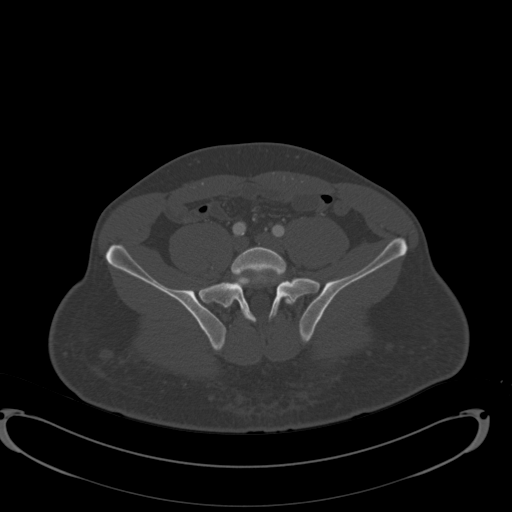
[im 323/431  soft-tissue]
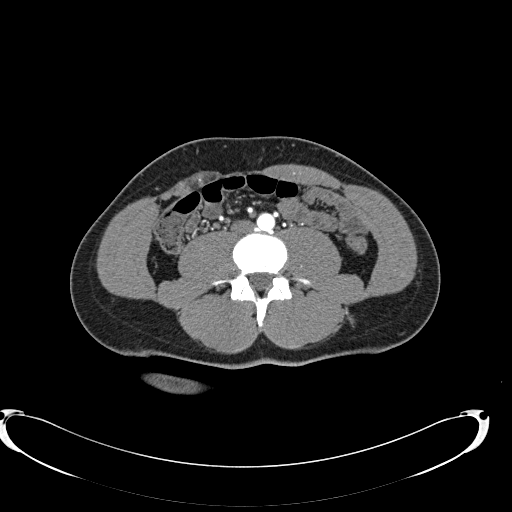
[im 323/431  lung]
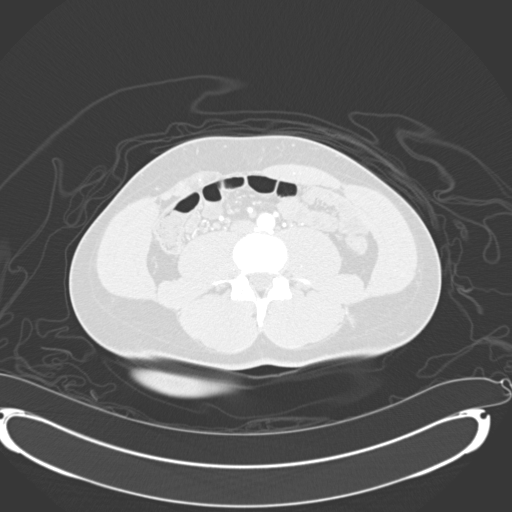
[im 350/431  lung]
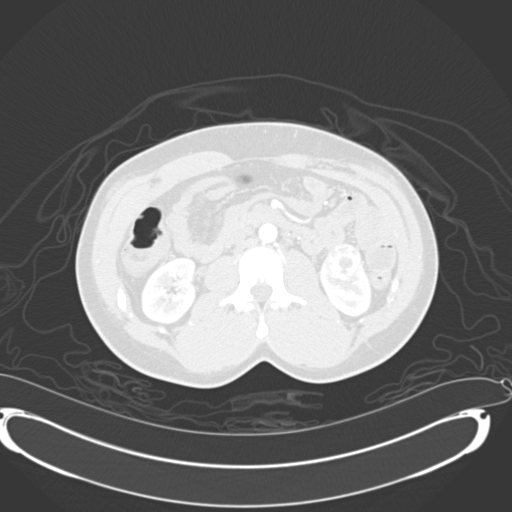
[im 377/431  soft-tissue]
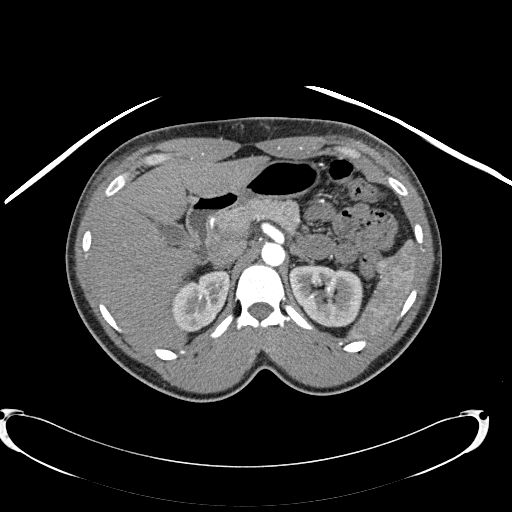
[im 377/431  lung]
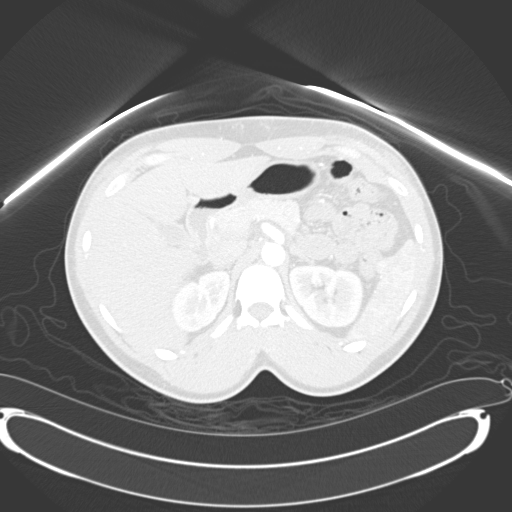
[im 404/431  soft-tissue]
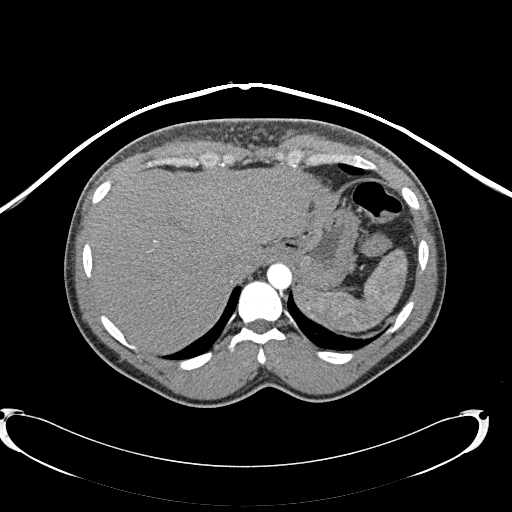
[im 404/431  lung]
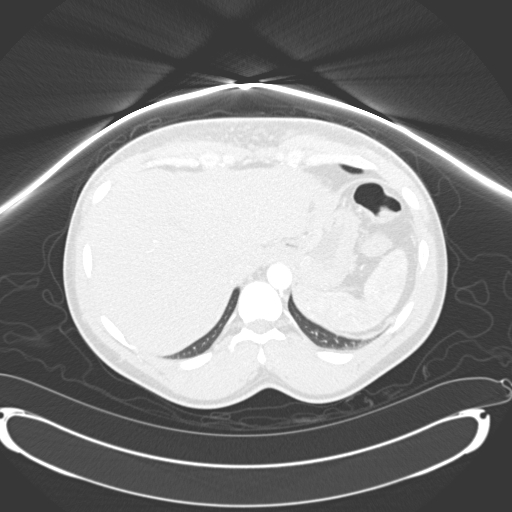

[13 of 48 positions shown; findings below may reference images not displayed]

FINDINGS: Three right renal arteries and two left renal arteries are patent.
The aorta is nonaneurysmal and patent.  SMA and celiac are patent.
IMA is patent.

Bilateral common iliac, external iliac, and internal iliac arteries
are patent.

Right lower extremity runoff demonstrates that the right common
femoral, profunda femoral, and superficial femoral artery are
patent.  Right popliteal artery is patent. Three-vessel runoff to
the right ankle.

Left common femoral, profunda femoral, and superficial femoral
artery are patent.  There is abrupt occlusion of the left popliteal
artery just beyond the abductor canal, well above the knee joint.
An irregular filling defect is present with a meniscus sign
compatible with acute thrombus.  The popliteal artery reconstitutes
below the knee joint, just before the takeoff of the anterior
tibial artery.  Several collateral vessels are seen extending
through the calf musculature about the occlusion. Below the
popliteal artery, there is three-vessel runoff to the left ankle.

The markedly hypervascular mass in the lower pole of the left
kidney has increased in size from 2.1 cm x 1.9 cm to 2.9 x 2.7 cm.
This is highly worrisome for a renal cell carcinoma.  Left renal
vein is grossly patent.  No evidence of associated abnormal
adenopathy.

Extensive stranding is seen in subcutaneous fat of the anterior
upper abdomen of unknown significance.  The arterial phase liver,
gallbladder, spleen, pancreas, and adrenal glands are within normal
limits.  No destructive bone lesion.

 Review of the MIP images confirms the above findings.
IMPRESSION: There is acute thrombus resulting occlusion of the popliteal artery
above the knee joint.  Below the knee, tibial vessels reconstitute
for three-vessel runoff.

Findings worrisome for an enlarging renal cell carcinoma in the
left kidney.  Urological consultation is recommended.

## 2012-03-27 ENCOUNTER — Emergency Department (HOSPITAL_COMMUNITY)
Admission: EM | Admit: 2012-03-27 | Discharge: 2012-03-29 | Disposition: A | Discharge status: Other Acute Inpatient Hospital | Payer: Self-pay | Attending: Emergency Medicine | Admitting: Emergency Medicine

## 2012-03-27 ENCOUNTER — Encounter (HOSPITAL_COMMUNITY): Payer: Self-pay | Admitting: Emergency Medicine

## 2012-03-27 DIAGNOSIS — T43294A Poisoning by other antidepressants, undetermined, initial encounter: Secondary | ICD-10-CM | POA: Insufficient documentation

## 2012-03-27 DIAGNOSIS — I10 Essential (primary) hypertension: Secondary | ICD-10-CM | POA: Insufficient documentation

## 2012-03-27 DIAGNOSIS — F209 Schizophrenia, unspecified: Secondary | ICD-10-CM | POA: Insufficient documentation

## 2012-03-27 DIAGNOSIS — F329 Major depressive disorder, single episode, unspecified: Secondary | ICD-10-CM | POA: Insufficient documentation

## 2012-03-27 DIAGNOSIS — I2510 Atherosclerotic heart disease of native coronary artery without angina pectoris: Secondary | ICD-10-CM | POA: Insufficient documentation

## 2012-03-27 DIAGNOSIS — T1491XA Suicide attempt, initial encounter: Secondary | ICD-10-CM

## 2012-03-27 DIAGNOSIS — T43502A Poisoning by unspecified antipsychotics and neuroleptics, intentional self-harm, initial encounter: Secondary | ICD-10-CM | POA: Insufficient documentation

## 2012-03-27 DIAGNOSIS — F1010 Alcohol abuse, uncomplicated: Secondary | ICD-10-CM | POA: Insufficient documentation

## 2012-03-27 DIAGNOSIS — Z79899 Other long term (current) drug therapy: Secondary | ICD-10-CM | POA: Insufficient documentation

## 2012-03-27 DIAGNOSIS — F3289 Other specified depressive episodes: Secondary | ICD-10-CM | POA: Insufficient documentation

## 2012-03-27 LAB — CBC
MCH: 29.6 pg (ref 26.0–34.0)
MCHC: 35.7 g/dL (ref 30.0–36.0)
MCV: 82.9 fL (ref 78.0–100.0)
Platelets: 177 10*3/uL (ref 150–400)
RDW: 13.5 % (ref 11.5–15.5)

## 2012-03-27 LAB — COMPREHENSIVE METABOLIC PANEL
ALT: 29 U/L (ref 0–53)
AST: 38 U/L — ABNORMAL HIGH (ref 0–37)
CO2: 24 mEq/L (ref 19–32)
Calcium: 8.7 mg/dL (ref 8.4–10.5)
Creatinine, Ser: 0.8 mg/dL (ref 0.50–1.35)
GFR calc non Af Amer: >90 mL/min (ref >90)
Sodium: 138 mEq/L (ref 135–145)
Total Protein: 6.9 g/dL (ref 6.0–8.3)

## 2012-03-27 LAB — GLUCOSE, CAPILLARY: Glucose-Capillary: 95 mg/dL (ref 70–99)

## 2012-03-27 LAB — ACETAMINOPHEN LEVEL: Acetaminophen (Tylenol), Serum: <15 ug/mL (ref 10–30)

## 2012-03-27 NOTE — ED Notes (Signed)
Sitter at bedside.

## 2012-03-27 NOTE — ED Notes (Signed)
Report given via EMS. Pt c/o depression and took 15 of 100 mg Trazadone. Pt reports "earlier". EMS eliminated time of ingestion from 1-2 hours prior. Pt also ingested 2/5 of [place] [employer] bottle (1500 ml). Pt reports wanting to kill himself. Pt stated in ED, "I want to kill himself." Pt has prior attempt he was hospitalized here for 3-4 months ago. Initial VS 130/82 Pulse 90 NSR O2 95 RA and 100% on 2L at 2100. 18 gauge in Left AC. EMS reported pt HIV positive. Pt nauseous and had dry heaving on transit. Pt hx of CA, MI 2 months prior, sx scar similar to mastectomy. GDP at bedside.

## 2012-03-27 NOTE — ED Notes (Signed)
Security wanded pt ?

## 2012-03-27 NOTE — ED Notes (Signed)
Pt still unable to urinate 

## 2012-03-27 NOTE — ED Notes (Signed)
HQI:ON62<XB> Expected date:<BR> Expected time:<BR> Means of arrival:<BR> Comments:<BR> overdose

## 2012-03-27 NOTE — ED Notes (Signed)
AC informed about sitter. AC informed me they do not have a sitter available.

## 2012-03-27 NOTE — ED Notes (Signed)
Poison control. Trazadone lasts 6 hours. Poison control recommended monitoring for that and an EKG. Rule out tylenol and salicylates.

## 2012-03-28 ENCOUNTER — Emergency Department (HOSPITAL_COMMUNITY): Payer: Self-pay

## 2012-03-28 LAB — CBC WITH DIFFERENTIAL/PLATELET
Eosinophils Absolute: 0.1 10*3/uL (ref 0.0–0.7)
Hemoglobin: 15.3 g/dL (ref 13.0–17.0)
Lymphs Abs: 1.4 10*3/uL (ref 0.7–4.0)
MCH: 29.2 pg (ref 26.0–34.0)
Monocytes Relative: 12 % (ref 3–12)
Neutro Abs: 1.9 10*3/uL (ref 1.7–7.7)
Neutrophils Relative %: 49 % (ref 43–77)
RBC: 5.24 MIL/uL (ref 4.22–5.81)

## 2012-03-28 LAB — RAPID URINE DRUG SCREEN, HOSP PERFORMED
Amphetamines: NOT DETECTED
Barbiturates: NOT DETECTED
Benzodiazepines: NOT DETECTED
Cocaine: POSITIVE — AB
Opiates: NOT DETECTED
Tetrahydrocannabinol: NOT DETECTED

## 2012-03-28 LAB — RAPID HIV SCREEN (WH-MAU): Rapid HIV Screen: NONREACTIVE

## 2012-03-28 LAB — OCCULT BLOOD, POC DEVICE: Fecal Occult Bld: NEGATIVE

## 2012-03-28 LAB — COMPREHENSIVE METABOLIC PANEL
Albumin: 3.9 g/dL (ref 3.5–5.2)
Alkaline Phosphatase: 45 U/L (ref 39–117)
BUN: 8 mg/dL (ref 6–23)
Chloride: 100 mEq/L (ref 96–112)
Creatinine, Ser: 0.79 mg/dL (ref 0.50–1.35)
GFR calc Af Amer: >90 mL/min (ref >90)
Glucose, Bld: 98 mg/dL (ref 70–99)
Potassium: 5.6 mEq/L — ABNORMAL HIGH (ref 3.5–5.1)
Total Bilirubin: 0.5 mg/dL (ref 0.3–1.2)

## 2012-03-28 MED ORDER — METOPROLOL SUCCINATE ER 50 MG PO TB24
50.0000 mg | ORAL_TABLET | Freq: Every day | ORAL | Status: DC
  Administered 2012-03-28 – 2012-03-29 (×2): 50 mg via ORAL
  Filled 2012-03-28 (×2): qty 1

## 2012-03-28 MED ORDER — NICOTINE 21 MG/24HR TD PT24
21.0000 mg | MEDICATED_PATCH | Freq: Every day | TRANSDERMAL | Status: DC
  Administered 2012-03-28 – 2012-03-29 (×2): 21 mg via TRANSDERMAL
  Filled 2012-03-28 (×2): qty 1

## 2012-03-28 MED ORDER — TRAZODONE HCL 100 MG PO TABS
300.0000 mg | ORAL_TABLET | Freq: Every day | ORAL | Status: DC
Start: 2012-03-29 — End: 2012-03-29

## 2012-03-28 MED ORDER — THIAMINE HCL 100 MG/ML IJ SOLN: 100.0000 mg | Freq: Every day | INTRAMUSCULAR | Status: DC

## 2012-03-28 MED ORDER — VITAMIN B-1 100 MG PO TABS
100.0000 mg | ORAL_TABLET | Freq: Every day | ORAL | Status: DC
  Administered 2012-03-28 – 2012-03-29 (×2): 100 mg via ORAL
  Filled 2012-03-28 (×2): qty 1

## 2012-03-28 MED ORDER — SERTRALINE HCL 50 MG PO TABS
50.0000 mg | ORAL_TABLET | Freq: Every day | ORAL | Status: DC
  Administered 2012-03-28 – 2012-03-29 (×2): 50 mg via ORAL
  Filled 2012-03-28 (×2): qty 1

## 2012-03-28 MED ORDER — DIVALPROEX SODIUM ER 500 MG PO TB24
500.0000 mg | ORAL_TABLET | ORAL | Status: DC
  Administered 2012-03-28 – 2012-03-29 (×3): 500 mg via ORAL
  Filled 2012-03-28 (×5): qty 1

## 2012-03-28 MED ORDER — IOHEXOL 300 MG/ML  SOLN
100.0000 mL | Freq: Once | INTRAMUSCULAR | Status: AC | PRN
  Administered 2012-03-28: 100 mL via INTRAVENOUS

## 2012-03-28 MED ORDER — ZOLPIDEM TARTRATE 10 MG PO TABS
10.0000 mg | ORAL_TABLET | Freq: Every evening | ORAL | Status: DC | PRN
  Administered 2012-03-28: 10 mg via ORAL
  Filled 2012-03-28: qty 1

## 2012-03-28 MED ORDER — GABAPENTIN 300 MG PO CAPS
600.0000 mg | ORAL_CAPSULE | Freq: Every day | ORAL | Status: DC
  Administered 2012-03-28: 600 mg via ORAL
  Filled 2012-03-28 (×2): qty 2

## 2012-03-28 MED ORDER — ATORVASTATIN CALCIUM 40 MG PO TABS
40.0000 mg | ORAL_TABLET | Freq: Every day | ORAL | Status: DC
  Administered 2012-03-28: 40 mg via ORAL
  Filled 2012-03-28 (×2): qty 1

## 2012-03-28 MED ORDER — LORAZEPAM 1 MG PO TABS
1.0000 mg | ORAL_TABLET | Freq: Four times a day (QID) | ORAL | Status: DC | PRN
  Administered 2012-03-28: 1 mg via ORAL
  Filled 2012-03-28: qty 1

## 2012-03-28 MED ORDER — ONDANSETRON HCL 4 MG PO TABS
4.0000 mg | ORAL_TABLET | Freq: Three times a day (TID) | ORAL | Status: DC | PRN
  Administered 2012-03-28: 4 mg via ORAL
  Filled 2012-03-28: qty 1

## 2012-03-28 MED ORDER — OLANZAPINE 5 MG PO TABS
5.0000 mg | ORAL_TABLET | Freq: Every day | ORAL | Status: DC
  Administered 2012-03-28: 5 mg via ORAL
  Filled 2012-03-28: qty 1

## 2012-03-28 MED ORDER — LORAZEPAM 2 MG/ML IJ SOLN
1.0000 mg | Freq: Four times a day (QID) | INTRAMUSCULAR | Status: DC | PRN
  Administered 2012-03-28 (×2): 1 mg via INTRAVENOUS
  Filled 2012-03-28 (×2): qty 1

## 2012-03-28 MED ORDER — GABAPENTIN 300 MG PO CAPS
300.0000 mg | ORAL_CAPSULE | Freq: Two times a day (BID) | ORAL | Status: DC
  Administered 2012-03-28 – 2012-03-29 (×3): 300 mg via ORAL
  Filled 2012-03-28 (×5): qty 1

## 2012-03-28 MED ORDER — IBUPROFEN 600 MG PO TABS: 600.0000 mg | ORAL_TABLET | Freq: Three times a day (TID) | ORAL | Status: DC | PRN

## 2012-03-28 MED ORDER — FOLIC ACID 1 MG PO TABS
1.0000 mg | ORAL_TABLET | Freq: Every day | ORAL | Status: DC
  Administered 2012-03-28 – 2012-03-29 (×2): 1 mg via ORAL
  Filled 2012-03-28 (×2): qty 1

## 2012-03-28 MED ORDER — ADULT MULTIVITAMIN W/MINERALS CH
1.0000 | ORAL_TABLET | Freq: Every day | ORAL | Status: DC
  Administered 2012-03-28 – 2012-03-29 (×2): 1 via ORAL
  Filled 2012-03-28 (×2): qty 1

## 2012-03-28 MED ORDER — ONDANSETRON HCL 4 MG/2ML IJ SOLN
4.0000 mg | Freq: Once | INTRAMUSCULAR | Status: AC
  Administered 2012-03-28: 4 mg via INTRAVENOUS
  Filled 2012-03-28: qty 2

## 2012-03-28 MED ORDER — DIPHENHYDRAMINE HCL 25 MG PO CAPS
50.0000 mg | ORAL_CAPSULE | Freq: Once | ORAL | Status: AC
Start: 2012-03-28 — End: 2012-03-28
  Administered 2012-03-28: 50 mg via ORAL
  Filled 2012-03-28: qty 2

## 2012-03-28 MED ORDER — HYDROMORPHONE HCL PF 1 MG/ML IJ SOLN
1.0000 mg | Freq: Once | INTRAMUSCULAR | Status: AC
  Administered 2012-03-28: 1 mg via INTRAVENOUS
  Filled 2012-03-28: qty 1

## 2012-03-28 MED ORDER — ACETAMINOPHEN 325 MG PO TABS
650.0000 mg | ORAL_TABLET | ORAL | Status: DC | PRN
  Administered 2012-03-28: 650 mg via ORAL
  Filled 2012-03-28: qty 2

## 2012-03-28 MED ORDER — ALUM & MAG HYDROXIDE-SIMETH 200-200-20 MG/5ML PO SUSP: 30.0000 mL | ORAL | Status: DC | PRN

## 2012-03-28 MED ORDER — LORAZEPAM 1 MG PO TABS: 1.0000 mg | ORAL_TABLET | Freq: Three times a day (TID) | ORAL | Status: DC | PRN

## 2012-03-28 MED ORDER — AMLODIPINE BESYLATE 10 MG PO TABS
10.0000 mg | ORAL_TABLET | Freq: Every day | ORAL | Status: DC
  Administered 2012-03-28 – 2012-03-29 (×2): 10 mg via ORAL
  Filled 2012-03-28 (×2): qty 1

## 2012-03-28 MED ORDER — SODIUM CHLORIDE 0.9 % IV BOLUS (SEPSIS)
1000.0000 mL | Freq: Once | INTRAVENOUS | Status: AC
  Administered 2012-03-28: 1000 mL via INTRAVENOUS

## 2012-03-28 NOTE — ED Provider Notes (Signed)
History     CSN: [id number]  Arrival date & time 03/27/12  2056   First MD Initiated Contact with Patient 03/27/12 2316      Chief Complaint  Patient presents with  . Drug Overdose    (Consider location/radiation/quality/duration/timing/severity/associated sxs/prior treatment) HPI  She presents as to the emergency department after a suicide attempt having taken 5 trazodone pills tried to kill himself. He admits to being an alcoholic and drinking every day. He says that he took 2/5 of the [place] wild Rose bottle. He says that his boyfriend just told him he's been diagnosed with AIDS, he is having problems with quitting drinking, and also deals with schizophrenia. He has many medical complications. Pt is stable at this time and is no acute distress but is drowsy. He is awake and alert and answers all of my questions approprietly.   Past Medical History  Diagnosis Date  . Seizures   . Hypertension   . Depression   . Pancreatitis   . Liver cirrhosis   . Coronary artery disease   . Angina   . Shortness of breath   . Headache   . Peripheral vascular disease April 2011    Left Pop    Past Surgical History  Procedure Date  . Chest surgery   . Left leg surgery   . Mastectomy     Family History  Problem Relation Age of Onset  . Stroke Other     History  Substance Use Topics  . Smoking status: Current Everyday Smoker -- 0.2 packs/day for 4 years    Types: Cigarettes  . Smokeless tobacco: Never Used  . Alcohol Use: 1.2 oz/week    2 Cans of beer per week      Review of Systems   Review of Systems  Gen: no weight loss, fevers, chills, night sweats  Eyes: no discharge or drainage, no occular pain or visual changes  Nose: no epistaxis or rhinorrhea  Mouth: no dental pain, no sore throat  Neck: no neck pain  Lungs:No wheezing, coughing or hemoptysis CV: no chest pain, palpitations, dependent edema or orthopnea  Abd: no abdominal pain, nausea, vomiting  GU: no dysuria  or gross hematuria  MSK:  No abnormalities  Neuro: no headache, no focal neurologic deficits  Skin: no abnormalities Psyche: depressed and + schizophrenia   Allergies  Morphine and Penicillins  Home Medications   Current Outpatient Rx  Name Route Sig Dispense Refill  . AMLODIPINE BESYLATE 10 MG PO TABS Oral Take 1 tablet (10 mg total) by mouth daily. 30 tablet 0  . ATORVASTATIN CALCIUM 40 MG PO TABS Oral Take 40 mg by mouth daily.    Marland Kitchen DIVALPROEX SODIUM ER 500 MG PO TB24 Oral Take 1 tablet (500 mg total) by mouth 2 (two) times daily in the am and at bedtime.. 90 tablet 0  . GABAPENTIN 300 MG PO CAPS Oral Take 1-2 capsules (300-600 mg total) by mouth 3 (three) times daily. Take 1 capsule by mouth twice daily and then, take 2 capsules by mouth at bedtime 120 capsule 0  . METOPROLOL SUCCINATE ER 50 MG PO TB24 Oral Take 1 tablet (50 mg total) by mouth daily. 30 tablet 0  . OLANZAPINE 5 MG PO TABS Oral Take 1 tablet (5 mg total) by mouth at bedtime. 30 tablet 0  . SERTRALINE HCL 50 MG PO TABS Oral Take 1 tablet (50 mg total) by mouth daily. 30 tablet 0  . THIAMINE HCL 100 MG PO  TABS Oral Take 1 tablet (100 mg total) by mouth daily. 30 tablet 0  . TRAZODONE HCL 100 MG PO TABS Oral Take 300 mg by mouth at bedtime.      BP 103/66  Pulse 70  Resp 15  SpO2 98%  Physical Exam  Nursing note and vitals reviewed. Constitutional: He appears well-developed and well-nourished. No distress.  HENT:  Head: Normocephalic and atraumatic.  Eyes: Pupils are equal, round, and reactive to light.  Neck: Normal range of motion. Neck supple.  Cardiovascular: Normal rate and regular rhythm.   Pulmonary/Chest: Effort normal. No respiratory distress. He has no wheezes.  Abdominal: Soft.  Neurological: He is alert.  Skin: Skin is warm and dry.  Psychiatric: His affect is labile. He is actively hallucinating. He exhibits a depressed mood. He expresses suicidal ideation. He expresses no homicidal ideation. He  expresses suicidal plans. He expresses no homicidal plans.    ED Course  Procedures (including critical care time)  Labs Reviewed  URINE RAPID DRUG SCREEN (HOSP PERFORMED) - Abnormal; Notable for the following:    Cocaine POSITIVE (*)     All other components within normal limits  CBC - Abnormal; Notable for the following:    WBC 3.9 (*)     All other components within normal limits  COMPREHENSIVE METABOLIC PANEL - Abnormal; Notable for the following:    Potassium 3.4 (*)     AST 38 (*)     Total Bilirubin 0.2 (*)     All other components within normal limits  ETHANOL - Abnormal; Notable for the following:    Alcohol, Ethyl (B) 295 (*)     All other components within normal limits  ACETAMINOPHEN LEVEL  GLUCOSE, CAPILLARY  RAPID HIV SCREEN ([facility])   No results found.   1. Depression   2. Schizophrenia   3. Suicide attempt       MDM  She monitored for 6 hours. His vital signs stayed stable. He is given alcohol withdrawal orders sets. Patient is now medically cleared at 3:40 AM. He is still endorsing suicidal ideation. I spoke with the act team who will evaluate him and holding orders have been placed.   Date: 03/28/2012  Rate: 79  Rhythm: normal sinus rhythm  QRS Axis: normal  Intervals: normal  ST/T Wave abnormalities: normal  Conduction Disutrbances:none  Narrative Interpretation:   Old EKG Reviewed: unchanged from November 14, 2011        Dorthula Matas, [place] 03/28/12 0341  Dorthula Matas, PA 03/28/12 [phone]

## 2012-03-28 NOTE — ED Provider Notes (Signed)
5:25 PM caught patient's bedside patient had 3 or 4 episodes of diarrhea states blood mixed with diarrhea. Patient also complains of periumbilical pain, constant since being brought here yesterday accompanied by nausea and vomited approximately 4 times since brought here yesterday. On exam alert nontoxic lungs clear auscultation heart regular rate and rhythm abdomen obese normal active bowel sounds diffusely tender rectum normal tone brown stool grossly negative genitalia normal male.  8:15 p.m. pain improved after treatment with intravenous Dilaudid. Feels some itching, no patient in no distress. Benadryl ordered I discussed patient's CT scan with him. Patient will need urologic followup as outpatient for renal mass. At 8:15 PM patient saw reports that he is suicidal. Transferred back to psychiatric emergency department.  Results for orders placed during the hospital encounter of 03/27/12  ACETAMINOPHEN LEVEL      Component Value Range   Acetaminophen (Tylenol), Serum <15.0  10 - 30 ug/mL  URINE RAPID DRUG SCREEN (HOSP PERFORMED)      Component Value Range   Opiates NONE DETECTED  NONE DETECTED   Cocaine POSITIVE (*) NONE DETECTED   Benzodiazepines NONE DETECTED  NONE DETECTED   Amphetamines NONE DETECTED  NONE DETECTED   Tetrahydrocannabinol NONE DETECTED  NONE DETECTED   Barbiturates NONE DETECTED  NONE DETECTED  CBC      Component Value Range   WBC 3.9 (*) 4.0 - 10.5 K/uL   RBC 5.44  4.22 - 5.81 MIL/uL   Hemoglobin 16.1  13.0 - 17.0 g/dL   HCT 16.1  09.6 - 04.5 %   MCV 82.9  78.0 - 100.0 fL   MCH 29.6  26.0 - 34.0 pg   MCHC 35.7  30.0 - 36.0 g/dL   RDW 40.9  81.1 - 91.4 %   Platelets 177  150 - 400 K/uL  COMPREHENSIVE METABOLIC PANEL      Component Value Range   Sodium 138  135 - 145 mEq/L   Potassium 3.4 (*) 3.5 - 5.1 mEq/L   Chloride 103  96 - 112 mEq/L   CO2 24  19 - 32 mEq/L   Glucose, Bld 88  70 - 99 mg/dL   BUN 6  6 - 23 mg/dL   Creatinine, Ser 7.82  0.50 - 1.35 mg/dL     Calcium 8.7  8.4 - 10.5 mg/dL   Total Protein 6.9  6.0 - 8.3 g/dL   Albumin 3.7  3.5 - 5.2 g/dL   AST 38 (*) 0 - 37 U/L   ALT 29  0 - 53 U/L   Alkaline Phosphatase 54  39 - 117 U/L   Total Bilirubin 0.2 (*) 0.3 - 1.2 mg/dL   GFR calc non Af Amer >90  >90 mL/min   GFR calc Af Amer >90  >90 mL/min  ETHANOL      Component Value Range   Alcohol, Ethyl (B) 295 (*) 0 - 11 mg/dL  GLUCOSE, CAPILLARY      Component Value Range   Glucose-Capillary 95  70 - 99 mg/dL   Comment 1 Notify RN     Comment 2 Documented in Chart    RAPID HIV SCREEN ([facility])      Component Value Range   SUDS Rapid HIV Screen NON REACTIVE  NON REACTIVE  COMPREHENSIVE METABOLIC PANEL      Component Value Range   Sodium 134 (*) 135 - 145 mEq/L   Potassium 5.6 (*) 3.5 - 5.1 mEq/L   Chloride 100  96 - 112 mEq/L  CO2 25  19 - 32 mEq/L   Glucose, Bld 98  70 - 99 mg/dL   BUN 8  6 - 23 mg/dL   Creatinine, Ser 1.61  0.50 - 1.35 mg/dL   Calcium 9.1  8.4 - 09.6 mg/dL   Total Protein 7.3  6.0 - 8.3 g/dL   Albumin 3.9  3.5 - 5.2 g/dL   AST 52 (*) 0 - 37 U/L   ALT 31  0 - 53 U/L   Alkaline Phosphatase 45  39 - 117 U/L   Total Bilirubin 0.5  0.3 - 1.2 mg/dL   GFR calc non Af Amer >90  >90 mL/min   GFR calc Af Amer >90  >90 mL/min  CBC WITH DIFFERENTIAL      Component Value Range   WBC 3.8 (*) 4.0 - 10.5 K/uL   RBC 5.24  4.22 - 5.81 MIL/uL   Hemoglobin 15.3  13.0 - 17.0 g/dL   HCT 04.5  40.9 - 81.1 %   MCV 84.2  78.0 - 100.0 fL   MCH 29.2  26.0 - 34.0 pg   MCHC 34.7  30.0 - 36.0 g/dL   RDW 91.4  78.2 - 95.6 %   Platelets 194  150 - 400 K/uL   Neutrophils Relative 49  43 - 77 %   Neutro Abs 1.9  1.7 - 7.7 K/uL   Lymphocytes Relative 37  12 - 46 %   Lymphs Abs 1.4  0.7 - 4.0 K/uL   Monocytes Relative 12  3 - 12 %   Monocytes Absolute 0.4  0.1 - 1.0 K/uL   Eosinophils Relative 2  0 - 5 %   Eosinophils Absolute 0.1  0.0 - 0.7 K/uL   Basophils Relative 1  0 - 1 %   Basophils Absolute 0.0  0.0 - 0.1 K/uL  LIPASE,  BLOOD      Component Value Range   Lipase 17  11 - 59 U/L  OCCULT BLOOD, POC DEVICE      Component Value Range   Fecal Occult Bld NEGATIVE     Ct Abdomen Pelvis W Contrast  03/28/2012  **ADDENDUM** CREATED: 03/28/2012 20:06:09  The first sentence under #3 of the impression should read as follows:  No significant change in a large number of small nodules in the subcutaneous fat of the lower chest anteriorly and pelvis laterally and posteriorly.  **END ADDENDUM** SIGNED BY: Londell Moh. Azucena Kuba, M.D.   03/28/2012  *RADIOLOGY REPORT*  Clinical Data: Suicide attempt with medication overdose.  Possible HIV infection.  CT ABDOMEN AND PELVIS WITH CONTRAST  Technique:  Multidetector CT imaging of the abdomen and pelvis was performed following the standard protocol during bolus administration of intravenous contrast.  Contrast: OMNIPAQUE IOHEXOL 300 MG/ML  SOLN  Comparison: 04/29/2010.  Findings: Diffuse low density of the liver relative to the spleen is again demonstrated.  The previously demonstrated 3.0 x 2.9 cm anterior lower pole left renal mass currently measures 3.4 x 2.9 cm in maximum dimensions on image number 40.  This mass remains heterogeneous with a rounded low density components which have progressed.  No other masses are seen and no enlarged lymph nodes are demonstrated.  Normal appearing appendix in the upper right pelvis.  No gastrointestinal abnormalities or enlarged lymph nodes.  Normal appearing spleen, pancreas, gallbladder, adrenal glands, right kidney and urinary bladder.  Normal appearing prostate gland.  A large number of small, rounded and oval nodular densities are demonstrated in the subcutaneous fat  at the level of the pelvis, laterally and posteriorly.  These are not included on 04/29/2010 and were present on 03/20/2010. Similar nodules in the anterior subcutaneous fat at the level of the lower chest are also stable.  Mild dependent atelectasis at both lung bases.  Stable small T12  vertebral body endplate sclerotic lesions, possibly representing small bone islands or sclerosis associate with minimal Schmorl's node formation.  Otherwise, unremarkable bones.  IMPRESSION:  1.  No acute abnormality. 2.  Minimal increase in size of the previously demonstrated heterogeneous left renal mass with progressive low density components.  This could represent a slowly growing renal cell carcinoma or oncocytoma. 3.  This did not change in a large number of small nodules in the subcutaneous fat of the lower chest anteriorly and pelvis laterally and posteriorly. 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  The patient previously had a history of bilateral breast silicone injections for augmentation.  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 4.  Previously demonstrated hepatic steatosis.   Original Report Authenticated By: Darrol Angel, M.D.    Diagnosis #1suicidal ideation #2 renal mass  Doug Sou, MD 03/28/12 2026

## 2012-03-28 NOTE — ED Notes (Signed)
MD at bedside. 

## 2012-03-28 NOTE — ED Notes (Signed)
Pt has one belonging bag lock in rm 23
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
## 2012-03-28 NOTE — ED Notes (Signed)
Informed [name] Pel, PA.

## 2012-03-28 NOTE — ED Notes (Signed)
Pt actively vomiting.

## 2012-03-28 NOTE — ED Notes (Signed)
Results of rapid HIV screen was nonreactive as reported by lab.

## 2012-03-28 NOTE — ED Notes (Signed)
[id number]<FA> Expected date:<BR> Expected time:<BR> Means of arrival:<BR> Comments:<BR> n/v

## 2012-03-29 ENCOUNTER — Encounter (HOSPITAL_COMMUNITY): Payer: Self-pay | Admitting: *Deleted

## 2012-03-29 ENCOUNTER — Inpatient Hospital Stay (HOSPITAL_COMMUNITY)
Admission: AD | Admit: 2012-03-29 | Discharge: 2012-04-04 | DRG: 885 | Disposition: A | Discharge status: Home / Self Care | Payer: Federal, State, Local not specified - Other | Source: Ambulatory Visit | Attending: Psychiatry | Admitting: Psychiatry

## 2012-03-29 DIAGNOSIS — F10988 Alcohol use, unspecified with other alcohol-induced disorder: Secondary | ICD-10-CM | POA: Diagnosis present

## 2012-03-29 DIAGNOSIS — F332 Major depressive disorder, recurrent severe without psychotic features: Principal | ICD-10-CM | POA: Diagnosis present

## 2012-03-29 DIAGNOSIS — F339 Major depressive disorder, recurrent, unspecified: Secondary | ICD-10-CM

## 2012-03-29 DIAGNOSIS — I10 Essential (primary) hypertension: Secondary | ICD-10-CM | POA: Diagnosis present

## 2012-03-29 DIAGNOSIS — F1020 Alcohol dependence, uncomplicated: Secondary | ICD-10-CM | POA: Diagnosis present

## 2012-03-29 DIAGNOSIS — Z79899 Other long term (current) drug therapy: Secondary | ICD-10-CM

## 2012-03-29 DIAGNOSIS — I2510 Atherosclerotic heart disease of native coronary artery without angina pectoris: Secondary | ICD-10-CM | POA: Diagnosis present

## 2012-03-29 DIAGNOSIS — F1410 Cocaine abuse, uncomplicated: Secondary | ICD-10-CM | POA: Diagnosis present

## 2012-03-29 DIAGNOSIS — F259 Schizoaffective disorder, unspecified: Secondary | ICD-10-CM | POA: Diagnosis present

## 2012-03-29 DIAGNOSIS — F1010 Alcohol abuse, uncomplicated: Secondary | ICD-10-CM

## 2012-03-29 DIAGNOSIS — E7800 Pure hypercholesterolemia, unspecified: Secondary | ICD-10-CM

## 2012-03-29 DIAGNOSIS — I739 Peripheral vascular disease, unspecified: Secondary | ICD-10-CM | POA: Diagnosis present

## 2012-03-29 DIAGNOSIS — F1910 Other psychoactive substance abuse, uncomplicated: Secondary | ICD-10-CM | POA: Diagnosis present

## 2012-03-29 DIAGNOSIS — K7460 Unspecified cirrhosis of liver: Secondary | ICD-10-CM | POA: Diagnosis present

## 2012-03-29 DIAGNOSIS — R7989 Other specified abnormal findings of blood chemistry: Secondary | ICD-10-CM | POA: Diagnosis present

## 2012-03-29 DIAGNOSIS — F10929 Alcohol use, unspecified with intoxication, unspecified: Secondary | ICD-10-CM | POA: Diagnosis present

## 2012-03-29 MED ORDER — ACETAMINOPHEN 325 MG PO TABS
650.0000 mg | ORAL_TABLET | Freq: Four times a day (QID) | ORAL | Status: DC | PRN
  Administered 2012-03-30 (×2): 650 mg via ORAL

## 2012-03-29 MED ORDER — METOPROLOL SUCCINATE ER 50 MG PO TB24
50.0000 mg | ORAL_TABLET | Freq: Every day | ORAL | Status: DC
  Administered 2012-03-30 – 2012-04-03 (×5): 50 mg via ORAL
  Filled 2012-03-29 (×6): qty 1

## 2012-03-29 MED ORDER — DIVALPROEX SODIUM 500 MG PO DR TAB
500.0000 mg | DELAYED_RELEASE_TABLET | Freq: Two times a day (BID) | ORAL | Status: DC
  Administered 2012-03-30 – 2012-04-03 (×9): 500 mg via ORAL
  Filled 2012-03-29 (×11): qty 1

## 2012-03-29 MED ORDER — CHLORDIAZEPOXIDE HCL 25 MG PO CAPS
25.0000 mg | ORAL_CAPSULE | ORAL | Status: AC
  Administered 2012-04-01 – 2012-04-02 (×2): 25 mg via ORAL
  Filled 2012-03-29 (×2): qty 1

## 2012-03-29 MED ORDER — AMLODIPINE BESYLATE 10 MG PO TABS
10.0000 mg | ORAL_TABLET | Freq: Every day | ORAL | Status: DC
  Administered 2012-03-30 – 2012-04-03 (×5): 10 mg via ORAL
  Filled 2012-03-29 (×6): qty 1

## 2012-03-29 MED ORDER — LORAZEPAM 1 MG PO TABS: 1.0000 mg | ORAL_TABLET | Freq: Four times a day (QID) | ORAL | Status: DC | PRN

## 2012-03-29 MED ORDER — ALUM & MAG HYDROXIDE-SIMETH 200-200-20 MG/5ML PO SUSP: 30.0000 mL | ORAL | Status: DC | PRN

## 2012-03-29 MED ORDER — VITAMIN B-1 100 MG PO TABS: 100.0000 mg | ORAL_TABLET | Freq: Every day | ORAL | Status: DC

## 2012-03-29 MED ORDER — HYDROXYZINE HCL 50 MG PO TABS: 50.0000 mg | ORAL_TABLET | Freq: Every evening | ORAL | Status: DC | PRN

## 2012-03-29 MED ORDER — LOPERAMIDE HCL 2 MG PO CAPS
2.0000 mg | ORAL_CAPSULE | ORAL | Status: AC | PRN
  Administered 2012-03-30: 4 mg via ORAL

## 2012-03-29 MED ORDER — CHLORDIAZEPOXIDE HCL 25 MG PO CAPS
25.0000 mg | ORAL_CAPSULE | Freq: Every day | ORAL | Status: AC
  Administered 2012-04-03: 25 mg via ORAL
  Filled 2012-03-29: qty 1

## 2012-03-29 MED ORDER — HYDROXYZINE HCL 25 MG PO TABS: 25.0000 mg | ORAL_TABLET | Freq: Four times a day (QID) | ORAL | Status: DC | PRN

## 2012-03-29 MED ORDER — GABAPENTIN 300 MG PO CAPS
300.0000 mg | ORAL_CAPSULE | Freq: Two times a day (BID) | ORAL | Status: DC
  Administered 2012-03-30 – 2012-04-03 (×9): 300 mg via ORAL
  Filled 2012-03-29 (×11): qty 1

## 2012-03-29 MED ORDER — THIAMINE HCL 100 MG/ML IJ SOLN
100.0000 mg | Freq: Once | INTRAMUSCULAR | Status: AC
  Administered 2012-03-29: 100 mg via INTRAMUSCULAR

## 2012-03-29 MED ORDER — LORAZEPAM 1 MG PO TABS
0.0000 mg | ORAL_TABLET | Freq: Four times a day (QID) | ORAL | Status: DC
  Administered 2012-03-29: 1 mg via ORAL
  Filled 2012-03-29: qty 1

## 2012-03-29 MED ORDER — ADULT MULTIVITAMIN W/MINERALS CH: 1.0000 | ORAL_TABLET | Freq: Every day | ORAL | Status: DC

## 2012-03-29 MED ORDER — CHLORDIAZEPOXIDE HCL 25 MG PO CAPS
25.0000 mg | ORAL_CAPSULE | Freq: Four times a day (QID) | ORAL | Status: AC | PRN
  Administered 2012-03-31: 25 mg via ORAL
  Filled 2012-03-29: qty 1

## 2012-03-29 MED ORDER — VITAMIN B-1 100 MG PO TABS
100.0000 mg | ORAL_TABLET | Freq: Every day | ORAL | Status: DC
  Administered 2012-03-30 – 2012-04-04 (×6): 100 mg via ORAL
  Filled 2012-03-29 (×8): qty 1

## 2012-03-29 MED ORDER — ONDANSETRON 4 MG PO TBDP
4.0000 mg | ORAL_TABLET | Freq: Four times a day (QID) | ORAL | Status: AC | PRN
  Administered 2012-03-30: 4 mg via ORAL

## 2012-03-29 MED ORDER — FOLIC ACID 1 MG PO TABS: 1.0000 mg | ORAL_TABLET | Freq: Every day | ORAL | Status: DC

## 2012-03-29 MED ORDER — LORAZEPAM 2 MG/ML IJ SOLN: 1.0000 mg | Freq: Four times a day (QID) | INTRAMUSCULAR | Status: DC | PRN

## 2012-03-29 MED ORDER — CHLORDIAZEPOXIDE HCL 25 MG PO CAPS
25.0000 mg | ORAL_CAPSULE | Freq: Three times a day (TID) | ORAL | Status: AC
  Administered 2012-03-31 – 2012-04-01 (×3): 25 mg via ORAL
  Filled 2012-03-29 (×3): qty 1

## 2012-03-29 MED ORDER — CHLORDIAZEPOXIDE HCL 25 MG PO CAPS
25.0000 mg | ORAL_CAPSULE | Freq: Once | ORAL | Status: AC
  Administered 2012-03-29: 25 mg via ORAL
  Filled 2012-03-29: qty 1

## 2012-03-29 MED ORDER — ADULT MULTIVITAMIN W/MINERALS CH
1.0000 | ORAL_TABLET | Freq: Every day | ORAL | Status: DC
  Administered 2012-03-30 – 2012-04-03 (×5): 1 via ORAL
  Filled 2012-03-29 (×6): qty 1

## 2012-03-29 MED ORDER — NICOTINE 21 MG/24HR TD PT24
21.0000 mg | MEDICATED_PATCH | Freq: Every day | TRANSDERMAL | Status: DC
  Administered 2012-03-30 – 2012-04-04 (×6): 21 mg via TRANSDERMAL
  Filled 2012-03-29 (×10): qty 1

## 2012-03-29 MED ORDER — CHLORDIAZEPOXIDE HCL 25 MG PO CAPS
25.0000 mg | ORAL_CAPSULE | Freq: Four times a day (QID) | ORAL | Status: AC
  Administered 2012-03-29 – 2012-03-31 (×6): 25 mg via ORAL
  Filled 2012-03-29 (×6): qty 1

## 2012-03-29 MED ORDER — THIAMINE HCL 100 MG/ML IJ SOLN: 100.0000 mg | Freq: Every day | INTRAMUSCULAR | Status: DC

## 2012-03-29 MED ORDER — MAGNESIUM HYDROXIDE 400 MG/5ML PO SUSP: 30.0000 mL | Freq: Every day | ORAL | Status: DC | PRN

## 2012-03-29 NOTE — ED Provider Notes (Signed)
[name] vitals are stable. Awaiting placement likely at behavior health  [name] Baker, MD 03/29/12 [phone]

## 2012-03-29 NOTE — Progress Notes (Signed)
D: Patient complained of withdrawal symptoms. Patient has been in bed and was in bed on approach. Patient stated he was not feeling well.  Patient rates depression 8/10.  Patient states there are many factors that contribute to him not wanting to live but does not elaborate.  Patient endorses passive SI but contracts for safety. Patient denies HI/ and denies AVH.  Patient started on Librium protocol. A: Staff to monitor Q 15 mins for safety.  Support and encouragement offered. Scheduled medications administered per orders.  R: Patient remains safe on the unit. Patient taking administered medications.  Patient pleasant and cooperative.

## 2012-03-29 NOTE — ED Notes (Signed)
Gave the patient towels, wash cloths, blue scrubs, and socks

## 2012-03-29 NOTE — ED Notes (Signed)
Awaiting visitors to arrive (parents)

## 2012-03-29 NOTE — Tx Team (Signed)
Initial Interdisciplinary Treatment Plan  PATIENT STRENGTHS: (choose at least two) Average or above average intelligence General fund of knowledge Religious Affiliation Supportive family/friends  PATIENT STRESSORS: Financial difficulties Legal issue Substance abuse   PROBLEM LIST: Problem List/Patient Goals Date to be addressed Date deferred Reason deferred Estimated date of resolution                                                         DISCHARGE CRITERIA:  Ability to meet basic life and health needs Adequate post-discharge living arrangements Improved stabilization in mood, thinking, and/or behavior Medical problems require only outpatient monitoring  PRELIMINARY DISCHARGE PLAN: Attend aftercare/continuing care group Attend 12-step recovery group Outpatient therapy Placement in alternative living arrangements  PATIENT/FAMIILY INVOLVEMENT: This treatment plan has been presented to and reviewed with the patient, [name].  The patient and family have been given the opportunity to ask questions and make suggestions.  [name] M 03/29/2012, 7:22 PM

## 2012-03-29 NOTE — Progress Notes (Signed)
Patient ID: [name], male   DOB: [birth year], 31 y.o.   MRN: [id number] Patient admits to some self harm thoughts but contracts for safety; patient denies HI and stated that he was seeing shadows and that he was hearing voices telling him to kill himself but the voices are not there as of now; patient rates his anxiety at 8/10; patient came in and he tried to kill himself by overdosing on Trazodone and he drinks a pint of alcohol daily; reports that his last use was Sunday 03/26/12;patient is in his room with no other complaints at this time

## 2012-03-30 LAB — VALPROIC ACID LEVEL: Valproic Acid Lvl: 43.2 ug/mL — ABNORMAL LOW (ref 50.0–100.0)

## 2012-03-30 LAB — LITHIUM LEVEL: Lithium Lvl: <0.25 mEq/L — ABNORMAL LOW (ref 0.80–1.40)

## 2012-03-30 MED ORDER — SERTRALINE HCL 50 MG PO TABS
50.0000 mg | ORAL_TABLET | Freq: Every day | ORAL | Status: DC
  Administered 2012-03-31 – 2012-04-03 (×4): 50 mg via ORAL
  Filled 2012-03-30 (×7): qty 1

## 2012-03-30 MED ORDER — MIRTAZAPINE 15 MG PO TBDP
15.0000 mg | ORAL_TABLET | Freq: Every day | ORAL | Status: DC
  Administered 2012-03-30 – 2012-04-02 (×4): 15 mg via ORAL
  Filled 2012-03-30 (×6): qty 1

## 2012-03-30 NOTE — ED Provider Notes (Signed)
Medical screening examination/treatment/procedure(s) were performed by non-physician practitioner and as supervising physician I was immediately available for consultation/collaboration.   Salaam Battershell L Kushi Kun, MD 03/30/12 1023 

## 2012-03-30 NOTE — Progress Notes (Signed)
Patient did attend the evening karaoke group.  
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
## 2012-03-30 NOTE — Progress Notes (Signed)
D: Patient in room reading a book on approach.  Patient states his day os better.  Patient states he had abdominal cramping today but it has gotten better.  Patient is concerned about  sleep because patient states he did not sleep last night.  Patient states that he has attended groups today and he has learned some coping skills.  Patient states he has learned that if he is having thoughts of suicide he needs to talk to someone.  Patient states endorses passive SI but contracts for safety.  Patient denies HI but states that he is having visual hallucinations where he sees shadows that come out of ceiling that tell him to hurt himself. Patient verbally contracts for safety and states he will talk to a staff members if he feels he will act on thoughts.  Patient rates depression 6/10 tonight.  A: Support and encouragement offered.  Staff to monitor Q 15 mins for safety. Scheduled medications administered. R: Patient remains safe on the unit.  Patient calm and cooperative and attended karaoke group but states he did not sing because his voice is gone.  Patient visible on the unit and taking administered medications.
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
## 2012-03-30 NOTE — Treatment Plan (Signed)
Interdisciplinary Treatment Plan Update (Adult)  Date: 03/30/2012  Time Reviewed: 11:15 AM   Progress in Treatment: Attending groups: Yes Participating in groups: Yes Taking medication as prescribed: Yes Tolerating medication: Yes   Family/Significant other contact made:  No Patient understands diagnosis:  Yes  As evidenced by asking for help with SI, depression, substance abuse Discussing patient identified problems/goals with staff:  Yes  See below Medical problems stabilized or resolved:  Yes Denies suicidal/homicidal ideation: No  States he has intermittent SI   Issues/concerns per patient self-inventory:  Yes  Poor sleep,  Depression and hopelessness 10s  Pain is a 10 Other:  New problem(s) identified: N/A  Reason for Continuation of Hospitalization: Depression Medication stabilization Suicidal ideation Withdrawal symptoms  Interventions implemented related to continuation of hospitalization: Librium taper  Medication for mood stabilization  Encourage group attendance and participation  Additional comments:  Estimated length of stay: 3-4 days  Discharge Plan: unknown  New goal(s): N/A  Review of initial/current patient goals per problem list:   1.  Goal(s): Eliminate SI  Met:  No  Target date:9/16  As evidenced ZO:XWRU report  2.  Goal (s):Safely detox from alcohol  Met:  No  Target date:9/15  As evidenced by:no withdrawal symptoms, stable vitals  3.  Goal(s):Stabilize mood  Met:  No  Target date:9/16  As evidenced EA:[id number] will report his depression and anxiety to be a 4 or less  4.  Goal(s): Identify comprehensive sobriety plan  Met:  No  Target date:9/15  As evidenced XB:JYNW report  Attendees: Patient:  [name] 03/30/2012 11:15 AM  Family:     Physician:  Lupe Carney 03/30/2012 11:15 AM   Nursing: Roswell Miners   03/30/2012 11:15 AM   Case Manager:  Richelle Ito, LCSW 03/30/2012 11:15 AM   Counselor:  Ronda Fairly, LCSWA  03/30/2012 11:15 AM   Other:     Other:     Other:     Other:      Scribe for Treatment Team:   Ida Rogue, 03/30/2012 11:15 AM

## 2012-03-30 NOTE — Progress Notes (Signed)
03/30/2012         Time: 1500      Group Topic/Focus: The focus of this group is on enhancing the patient's understanding of leisure, barriers to leisure, and the importance of engaging in positive leisure activities upon discharge for improved total health.  Participation Level: Active  Participation Quality: Attentive   Affect: Appropriate  Cognitive: Oriented   Additional Comments: Patient able to identify positive leisure interests he can engage in upon discharge.    Sukhmani Fetherolf 03/30/2012 4:00 PM 

## 2012-03-30 NOTE — H&P (Signed)
Pt seen and evaluated upon admission.  Completed Admission Suicide Risk Assessment.  See orders.  Pt agreeable with plan.  Discussed with team.   

## 2012-03-30 NOTE — Progress Notes (Signed)
[facility] Group Notes:  (Counselor/Nursing/MHT/Case Management/Adjunct)   Type of Therapy:  Group Therapy 1:15-2:30 PM 03/30/2012   Participation Level:  Active  Participation Quality:  Attentive and Sharing  Affect:  Depressed  Cognitive:  Alert and Oriented  Insight:  Good  Engagement in Group:  Limited  Engagement in Therapy:  Limited  Modes of Intervention:  Activity, Clarification, Socialization and Support  Summary of Progress/Problems: Focus of group processing discussion was on balance in life; the components in life which have a negative influence on balance and the components which make for a more balanced life.  Patient processed how he self medicates with substances most often when his is non compliant with medications and once he starts using alcohol things get quickly out of control.  "I lost my job, my apartment that I loved and now have multiple health concerns."  Patient related recovery to relationships that have possibilities.     [name]
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
## 2012-03-30 NOTE — Progress Notes (Signed)
Patient ID: [name], male   DOB: Nov 30, 1980, 31 y.o.   MRN: [id number] He has been up for only short periods of time in room remainder of time. He continues to have thoughts of harming self but says that he will inform us if he gets a plan.  He said that he had a seizure last week.

## 2012-03-31 DIAGNOSIS — F39 Unspecified mood [affective] disorder: Secondary | ICD-10-CM

## 2012-03-31 DIAGNOSIS — F1010 Alcohol abuse, uncomplicated: Secondary | ICD-10-CM

## 2012-03-31 DIAGNOSIS — F339 Major depressive disorder, recurrent, unspecified: Secondary | ICD-10-CM

## 2012-03-31 LAB — VALPROIC ACID LEVEL: Valproic Acid Lvl: 60.2 ug/mL (ref 50.0–100.0)

## 2012-03-31 LAB — LIPASE, BLOOD: Lipase: 22 U/L (ref 11–59)

## 2012-03-31 MED ORDER — RISPERIDONE 1 MG PO TABS
1.0000 mg | ORAL_TABLET | Freq: Every day | ORAL | Status: DC
  Administered 2012-03-31 – 2012-04-02 (×3): 1 mg via ORAL
  Filled 2012-03-31 (×5): qty 1

## 2012-03-31 MED ORDER — RISPERIDONE 0.5 MG PO TABS
0.5000 mg | ORAL_TABLET | Freq: Two times a day (BID) | ORAL | Status: DC
  Administered 2012-04-01 – 2012-04-03 (×5): 0.5 mg via ORAL
  Filled 2012-03-31 (×10): qty 1

## 2012-03-31 NOTE — Progress Notes (Signed)
Psychoeducational Group Note  Date:  03/31/2012 Time:  1030  Group Topic/Focus:  Relapse Prevention Planning:   The focus of this group is to define relapse and discuss the need for planning to combat relapse.  Participation Level:  Minimal  Participation Quality:  Appropriate, Drowsy and Resistant  Affect:  Appropriate and Blunted  Cognitive:  Alert and Appropriate  Insight:  Good  Engagement in Group:  Good  Additional Comments:  Pt attended group but participation was limited.   Dalia Heading 03/31/2012, 12:35 PM

## 2012-03-31 NOTE — Progress Notes (Signed)
[facility] Adult Inpatient Family/Significant Other Suicide Prevention Education  Suicide Prevention Education:   Attempt was made to go over suicide prevention education information with patient's mother, [name], yet Mrs [name] asked we contact her later today at [phone] as she was with grandchild at the physician's office.   [name] 03/31/2012 1:00 PM

## 2012-03-31 NOTE — Progress Notes (Signed)
   D: Spoke with patient due to reporting to nursing student about hearing voices to harm self. Patient stated that they have been worse this afternoon. Reporting about his plan would be to overdose if at home. Contracts to come to staff here if wanting to harm self here. A: Reported to physician and PA seeing patient today. Attempting to get medication for patient. R: Encouraged to rest if he needs to be out of group.

## 2012-03-31 NOTE — H&P (Addendum)
[facility] In Patient Progress Note 03/31/2012 10:39 PM [name] 06/03/1981 [id number] Hospital day #:2 Diagnosis:  Axis I: Alcohol Use Disorder; Major Depressive Disorder - Recurrent, noted upon last admission; Mood Disorder Unspecified   ADL's:  Intact Sleep:  Minimally improved Appetite:improving  Groups:Good  Subjective: Met with the patient today to discuss his current response to treatment.  He notes that he is still hearing voices and an antipsychotic has not been started. He was on Abilify, but it had stopped working.  He also will have difficulty paying for expensive medication and will need to get the most bang for his buck.  He hopes to continue at [facility] on discharge from [facility].   height is 5\' 6"  (1.676 m) and weight is 85.276 kg (188 lb). His oral temperature is 97.1 F (36.2 C). His blood pressure is 103/69 and his pulse is 62. His respiration is 18.   Objective:  The patient notes that the auditory hallucinations are worse and he is unable to sleep.  He rates his depression at an 8, severe. His anxiety was at an 8 as well and he notes feeings of hopelessness is at a 7.   Sx of withdrawal:Diaphoresis Diarrhea Headaches Nausea Tremors  Ros: ROS: Constitional: WDWN Adult in NAD COR: negative for SOB, CP, cough, wheezing GI: Negative for Nausea, vomiting, diarrhea, constipation, abdominal pain Neuro: negative for dizziness, blurred vision, headaches, numbness or tingling Ortho: negative for limb pain, swelling, change in ambulatory status.  Mental Status Exam Level of Consciousness: alert Orientation: x 3 General Appearance : fairly groomed Behavior:  cooperative Eye Contact:  good Motor Behavior:  normal Speech:  clear Mood:  anxious  Suicidal Ideation: No suicidal ideation, no plan, no intent, no means. Homicidal Ideation:  No homicidal ideation, no plan, no intent, no means.  Affect:  congruent Anxiety Level:  Moderate to severe Thought Process:  linear Thought  Content:  "some voices" Perception:  intact Judgment:  fair Insight:  fair Cognition: at least average  Sleep:  Number of Hours: 6.5     Lab Results:  Results for orders placed during the hospital encounter of 03/29/12 (from the past 48 hour(s))  VALPROIC ACID LEVEL     Status: Abnormal   Collection Time   03/30/12  6:20 AM      Component Value Range Comment   Valproic Acid Lvl 43.2 (*) 50.0 - 100.0 ug/mL   LITHIUM LEVEL     Status: Abnormal   Collection Time   03/30/12  6:20 AM      Component Value Range Comment   Lithium Lvl <0.25 (*) 0.80 - 1.40 mEq/L RESULT REPEATED AND VERIFIED  AMYLASE     Status: Normal   Collection Time   03/31/12  6:20 AM      Component Value Range Comment   Amylase 57  0 - 105 U/L   LIPASE, BLOOD     Status: Normal   Collection Time   03/31/12  6:20 AM      Component Value Range Comment   Lipase 22  11 - 59 U/L   VALPROIC ACID LEVEL     Status: Normal   Collection Time   03/31/12  6:20 AM      Component Value Range Comment   Valproic Acid Lvl 60.2  50.0 - 100.0 ug/mL    Labs are reviewed. Physical Findings: AIMS: CIWA:  CIWA-Ar Total: 1  COWS:    Medication:   . amLODipine  10 mg Oral Daily  .  chlordiazePOXIDE  25 mg Oral QID   Followed by  . chlordiazePOXIDE  25 mg Oral TID   Followed by  . chlordiazePOXIDE  25 mg Oral BH-qamhs   Followed by  . chlordiazePOXIDE  25 mg Oral Daily  . divalproex  500 mg Oral Q12H  . gabapentin  300 mg Oral BID  . metoprolol succinate  50 mg Oral Daily  . mirtazapine  15 mg Oral QHS  . multivitamin with minerals  1 tablet Oral Daily  . nicotine  21 mg Transdermal Q0600  . sertraline  50 mg Oral Daily  . thiamine  100 mg Oral Daily    Treatment Plan Summary: 1. Continue admission for crisis management and stabilization. 2. Medication management to reduce current symptoms to base line and improve the     patient's overall level of functioning 3. Treat health problems as indicated. 4. Develop treatment  plan to decrease risk of relapse upon discharge and the need for     readmission. 5. Psycho-social education regarding relapse prevention and self care. 6. Health care follow up as needed for medical problems. 7. Restart home medications where appropriate.   Plan: 1. Will add Risperdal 0.5po BID for psychosis. 2. Will add risperdal 1mg  po at his for sleep. 3. Continue all other medication as written. 4. Will follow. Rona Ravens. Nelson Julson PAC 03/31/2012, 10:39 PM

## 2012-03-31 NOTE — Discharge Planning (Signed)
[name] attended AM group.  Says he feels better than yesterday.  Told him he has a bed at [facility] for 24th.  States that is his plan A as [place] does not sound so appealing, and he will stay with parents until he can go to [facility].

## 2012-03-31 NOTE — Progress Notes (Signed)
[facility] Adult Inpatient Family/Significant Other Suicide Prevention Education  Suicide Prevention Education:  Education Completed; [name] at [phone] has been identified by the patient as the family member/significant other with whom the patient will be residing, and identified as the person(s) who will aid the patient in the event of a mental health crisis (suicidal ideations/suicide attempt).  With written consent from the patient, the family member/significant other has been provided the following suicide prevention education, prior to the and/or following the discharge of the patient.  The suicide prevention education provided includes the following:  Suicide risk factors  Suicide prevention and interventions  National Suicide Hotline telephone number  [facility] assessment telephone number  [facility] Emergency Assistance 911  [facility] and/or Residential Mobile Crisis Unit telephone number  Request made of family/significant other to:  Remove weapons (e.g., guns, rifles, knives), all items previously/currently identified as safety concern.    Remove drugs/medications (over-the-counter, prescriptions, illicit drugs), all items previously/currently identified as a safety concern.  [name] mother reports that there are no firearms in the home nor tylenol PM and she is willing to help patient administer medications.   The family member/significant other verbalizes understanding of the suicide prevention education information provided.  The family member/significant other agrees to remove the items of safety concern listed above.  [name] 03/31/2012, 4:45 PM

## 2012-03-31 NOTE — Progress Notes (Signed)
Patient ID: [name], male   DOB: 01-08-1981, 31 y.o.   MRN: [id number]   D: Patient has a flat affect on approach this am.  Reports only minimal progress with depression. Gives depression and hopelessness feelings "6" on scales. Reports slept better with the Remeron that was ordered for him last night. Reports poor appetite and low energy. Feels that his attention span is improving. States he still has SI "on and off" but contracts to come to staff if feeling like he would harm self.  A: Staff will monitor on q 15 minute checks and encourage group attendance. RN to follow treatments and medications ordered. R: Taking medications without issue this am.

## 2012-03-31 NOTE — Progress Notes (Signed)
D   Pt isolates to his room  He appears depressed and sad  He reports hearing voices that are derogatory in nature and has been having off and on suicidal ideation with no plan and contracts for safety  He has minimal signs and symptoms of withdrawal  He interacts minimally with others but is appropriate in his behaviors and thinking A   Verbal support given  Medications administered and effectiveness monitored Q 15 min checks R   Pt safe at present time

## 2012-04-01 NOTE — Progress Notes (Signed)
Psychoeducational Group Note  Date:  04/01/2012 Time:  1015  Group 2Topic/Focus:  Coping With Mental Health Crisis:   The purpose of this group is to help patients identify strategies for coping with mental health crisis.  Group discusses possible causes of crisis and ways to manage them effectively.  Participation Level:  Active  Participation Quality:  Appropriate and Attentive  Affect:  Appropriate  Cognitive:  Alert and Appropriate  Insight:  Good  Engagement in Group:  Good  Additional Comments:   Cresenciano Lick 04/01/2012, 11:41 AM

## 2012-04-01 NOTE — Progress Notes (Signed)
D-Patient out in milieu interacting with peers and attending groups.  A-Rates depression and hopelessness at 6.  Reports improving appetite and ability to pay attention.  Tremors and cravings r/t withdrawal, but no prn medications requested. "Off and on" SI and contracts for safety. Desires 90 treatment. Appropriate behavior and mood. R- Support and encoragement given. Continue current POC and evaluation of treatment goals. Continue 15' checks for safety.

## 2012-04-01 NOTE — Progress Notes (Signed)
Patient ID: [name], male   DOB: April 25, 1981, 31 y.o.   MRN: [id number] D: Pt. In bed resting, eyes closed. A: Staff will monitor q34min for safety. R: Pt. Is safe on the unit. Resp. Even, unlabored,no distress noted.

## 2012-04-01 NOTE — Progress Notes (Signed)
Patient ID: [name], male   DOB: [birth year], 31 y.o.   MRN: [id number] D: Pt. In bed resting, eyes closed. A: staff will monitor q41min for safety. R: Pt is safe on the units. Resp. Even, unlabored. No distress noted.

## 2012-04-01 NOTE — Progress Notes (Signed)
Met with pt 1:1. He is pleasant and cooperative. Denies any withdrawal. VSS. No pain or problems. Does endorse command AH to harm self but is able to contract for safety with sustained eye contact. Medicated per orders. No prn's requested or needed. Attended evening wrap up group. Pt denies HI/VH. Lawrence Marseilles

## 2012-04-01 NOTE — Progress Notes (Deleted)
Psychoeducational Group Note  Date:  04/01/2012 Time:  1015  Group Topic/Focus:  Conflict Resolution:   The focus of this group is to discuss the conflict resolution process and how it may be used upon discharge.  Participation Level:  Active  Participation Quality:  Appropriate and Attentive  Affect:  Appropriate  Cognitive:  Alert and Appropriate  Insight:  Good  Engagement in Group:  Good  Additional Comments:    Cresenciano Lick 04/01/2012, 4:45 PM

## 2012-04-01 NOTE — Progress Notes (Signed)
Patient ID: [name], male   DOB: 1981/07/06, 31 y.o.   MRN: [id number]  [facility] Group Notes:  (Counselor/Nursing/MHT/Case Management/Adjunct)  04/01/2012 1:15 PM  Type of Therapy:  Group Therapy, Dance/Movement Therapy    Participation Level:  Minimal  Participation Quality:  Drowsy and Sharing  Affect:  Flat  Cognitive:  Appropriate  Insight:  Limited  Engagement in Group:  Good  Engagement in Therapy:  Limited  Modes of Intervention:  Clarification, Problem-solving, Role-play, Socialization and Support  Summary of Progress/Problems:  Pt attended counseling group on positive reframing and coping skills to utilize in facing addiction.  Pt spoke quietly during group but was able to share positive traits about fellow group members.    Debarah Crape 04/01/2012. 2:47 PM

## 2012-04-01 NOTE — Progress Notes (Signed)
Pt. did not attend aftercare planning group. Pt. accepted daily workbook on healthy coping skills. 

## 2012-04-01 NOTE — Progress Notes (Signed)
  [name] is a 31 y.o. male [id number] [birth year]  03/29/2012 Principal Problem:  *SUBSTANCE ABUSE, MULTIPLE Active Problems:  LIVER FUNCTION TESTS, ABNORMAL  Acute alcohol intoxication   Mental Status: Mood calm appropriate to situation. Denies SI/HI AH have decreased but not resolved yet.  Subjective/Objective: Up dressed active in [place] -no distress.   Filed Vitals:   04/01/12 0915  BP: 111/73  Pulse: 76  Temp:   Resp:     Lab Results:   BMET    Component Value Date/Time   NA 134* 03/28/2012 1830   K 5.6* 03/28/2012 1830   CL 100 03/28/2012 1830   CO2 25 03/28/2012 1830   GLUCOSE 98 03/28/2012 1830   BUN 8 03/28/2012 1830   CREATININE 0.79 03/28/2012 1830   CALCIUM 9.1 03/28/2012 1830   GFRNONAA >90 03/28/2012 1830   GFRAA >90 03/28/2012 1830    Medications:  Scheduled:     . amLODipine  10 mg Oral Daily  . chlordiazePOXIDE  25 mg Oral TID   Followed by  . chlordiazePOXIDE  25 mg Oral BH-qamhs   Followed by  . chlordiazePOXIDE  25 mg Oral Daily  . divalproex  500 mg Oral Q12H  . gabapentin  300 mg Oral BID  . metoprolol succinate  50 mg Oral Daily  . mirtazapine  15 mg Oral QHS  . multivitamin with minerals  1 tablet Oral Daily  . nicotine  21 mg Transdermal Q0600  . risperiDONE  0.5 mg Oral BID  . risperiDONE  1 mg Oral QHS  . sertraline  50 mg Oral Daily  . thiamine  100 mg Oral Daily     PRN Meds acetaminophen, alum & mag hydroxide-simeth, chlordiazePOXIDE, loperamide, magnesium hydroxide, ondansetron  Plan: Continue with current plan of care [name],[name]. 04/01/2012

## 2012-04-02 NOTE — Progress Notes (Signed)
Psychoeducational Group Note  Date:  04/02/2012 Time:  1030  Group Topic/Focus:  Crisis Planning:   The purpose of this group is to help patients create a crisis plan for use upon discharge or in the future, as needed.  Participation Level:  Minimal  Participation Quality:  Attentive  Affect:  Blunted  Cognitive:  Appropriate  Insight:  Limited  Engagement in Group:  None  Additional Comments:   Cresenciano Lick 04/02/2012, 12:15 PM

## 2012-04-02 NOTE — Progress Notes (Signed)
  [name] is a 31 y.o. male [id number] [birth year]  03/29/2012 Principal Problem:  *SUBSTANCE ABUSE, MULTIPLE Active Problems:  LIVER FUNCTION TESTS, ABNORMAL  Acute alcohol intoxication   Mental Status: Mood is about the same denies active SI/HI still reports AH but they are softer.    Subjective/Objective: Asking for Klonopin-explained this med does not  Treat AH and he is on Librium to help with detox. May need neuropsych testing -not sure of his intellectual level.   Filed Vitals:   04/02/12 0701  BP: 114/74  Pulse: 82  Temp:   Resp:     Lab Results:   BMET    Component Value Date/Time   NA 134* 03/28/2012 1830   K 5.6* 03/28/2012 1830   CL 100 03/28/2012 1830   CO2 25 03/28/2012 1830   GLUCOSE 98 03/28/2012 1830   BUN 8 03/28/2012 1830   CREATININE 0.79 03/28/2012 1830   CALCIUM 9.1 03/28/2012 1830   GFRNONAA >90 03/28/2012 1830   GFRAA >90 03/28/2012 1830    Medications:  Scheduled:     . amLODipine  10 mg Oral Daily  . chlordiazePOXIDE  25 mg Oral BH-qamhs   Followed by  . chlordiazePOXIDE  25 mg Oral Daily  . divalproex  500 mg Oral Q12H  . gabapentin  300 mg Oral BID  . metoprolol succinate  50 mg Oral Daily  . mirtazapine  15 mg Oral QHS  . multivitamin with minerals  1 tablet Oral Daily  . nicotine  21 mg Transdermal Q0600  . risperiDONE  0.5 mg Oral BID  . risperiDONE  1 mg Oral QHS  . sertraline  50 mg Oral Daily  . thiamine  100 mg Oral Daily     PRN Meds acetaminophen, alum & mag hydroxide-simeth, chlordiazePOXIDE, loperamide, magnesium hydroxide, ondansetron Plan: check depakote level and continue current plan of care.  Samiksha Pellicano,MICKIE D. 04/02/2012

## 2012-04-02 NOTE — Progress Notes (Signed)
[facility] Group Notes:  (Counselor/Nursing/MHT/Case Management/Adjunct)  04/02/2012 6:31 PM  Type of Therapy:  Psychoeducational Skills  Participation Level:  Did Not Attend   [name] 04/02/2012, 6:31 PM

## 2012-04-02 NOTE — Progress Notes (Signed)
D- [name] is quiet and isolative this shft. Attending groups with minimal participation. A- Flat affect and depressed mood. Rates depression and hopelessness at 6.  "Off and on" SI and contracts for safety.  Reports improving appetite but low energy. Compliant with scheduled medications and no prn's requested. Goals are "go to AA".  R- Support and encouragement given. Continue current POC and evaluation of treatment goals. Continue 15' checks for safety.

## 2012-04-02 NOTE — Progress Notes (Signed)
[facility] Group Notes:  (Counselor/Nursing/MHT/Case Management/Adjunct)  04/01/2012  2100  Type of Therapy:  wrap up group  Participation Level:  Active  Participation Quality:  Attentive, Drowsy and Sharing  Affect:  Appropriate and Flat  Cognitive:  Appropriate  Insight:  Good  Engagement in Group:  Good  Engagement in Therapy:  Good  Modes of Intervention:  Clarification, Education and Support  Summary of Progress/Problems: Patient is looking forward to gong to long term treatment at [facility].  Pt states "I have the best family in the world" when this Clinical research associate asked if he had family support.  Pt reported wanting to have less suicidal thoughts tomorrow and shared that talking helped to decrease these thoughts.    Shelah Lewandowsky 04/02/2012, 3:42 AM

## 2012-04-02 NOTE — Progress Notes (Signed)
Spoke with pt 1:1 who remains flat, anxious and depressed. Currently struggling with craving of alcohol. Wanting prn but explained librium protocol. Pt wondering if he needs med adjustment. Supported and encouraged pt to develop some coping skills - distraction, relaxation, talking with peers - as some of these challenges will not be fixed with a pill. Pt did spend time speaking with a peer which he said was quite helpful. He continues to endorse command AH and now reports seeing shadows. He reports passive SI but is able to contract for safety with sustained eye contact. Will continue to monitor closely. [name]

## 2012-04-02 NOTE — Progress Notes (Signed)
Patient ID: [name], male   DOB: October 02, 1980, 31 y.o.   MRN: [id number]  [facility] Group Notes:  (Counselor/Nursing/MHT/Case Management/Adjunct)  04/02/2012 1:15 PM  Type of Therapy:  Group Therapy, Dance/Movement Therapy   Participation Level:  Minimal  Participation Quality:  Drowsy and Inattentive  Affect:  Blunted, Depressed and Flat  Cognitive:  Confused  Insight:  Limited  Engagement in Group:  Limited  Engagement in Therapy:  Limited  Modes of Intervention:  Clarification, Problem-solving, Role-play, Socialization and Support  Summary of Progress/Problems:  Pt. Attended group on identifying the traits of healthy supports and unhealthy supports.  Pt dosed during group and was quiet for the most part.  Debarah Crape 04/02/2012. 3:21 PM

## 2012-04-02 NOTE — Progress Notes (Signed)
Patient ID: [name], male   DOB: Aug 09, 1980, 31 y.o.   MRN: [id number]  Pt. attended and participated in aftercare planning group. Pt. accepted information on suicide prevention, warning signs to look for with suicide and crisis line numbers to use. The pt. agreed to call crisis line numbers if having warning signs or having thoughts of suicide. Pt. listed their current anxiety and depression levels as 9 out of 10.  Pt received daily workbook and "just for today" sheet.  Pt dosed during group and fell asleep several times.

## 2012-04-03 DIAGNOSIS — F332 Major depressive disorder, recurrent severe without psychotic features: Principal | ICD-10-CM

## 2012-04-03 DIAGNOSIS — F1010 Alcohol abuse, uncomplicated: Secondary | ICD-10-CM

## 2012-04-03 IMAGING — CR DG CHEST 2V
2 series · 2 of 2 positions shown · non-contrast
Comparison: 04/04/2008

CLINICAL DATA: Cough.  Short of breath.  Left popliteal artery
occlusion.  Preop respiratory exam.

CHEST - 2 VIEW

[view not recorded (1 of 2)]
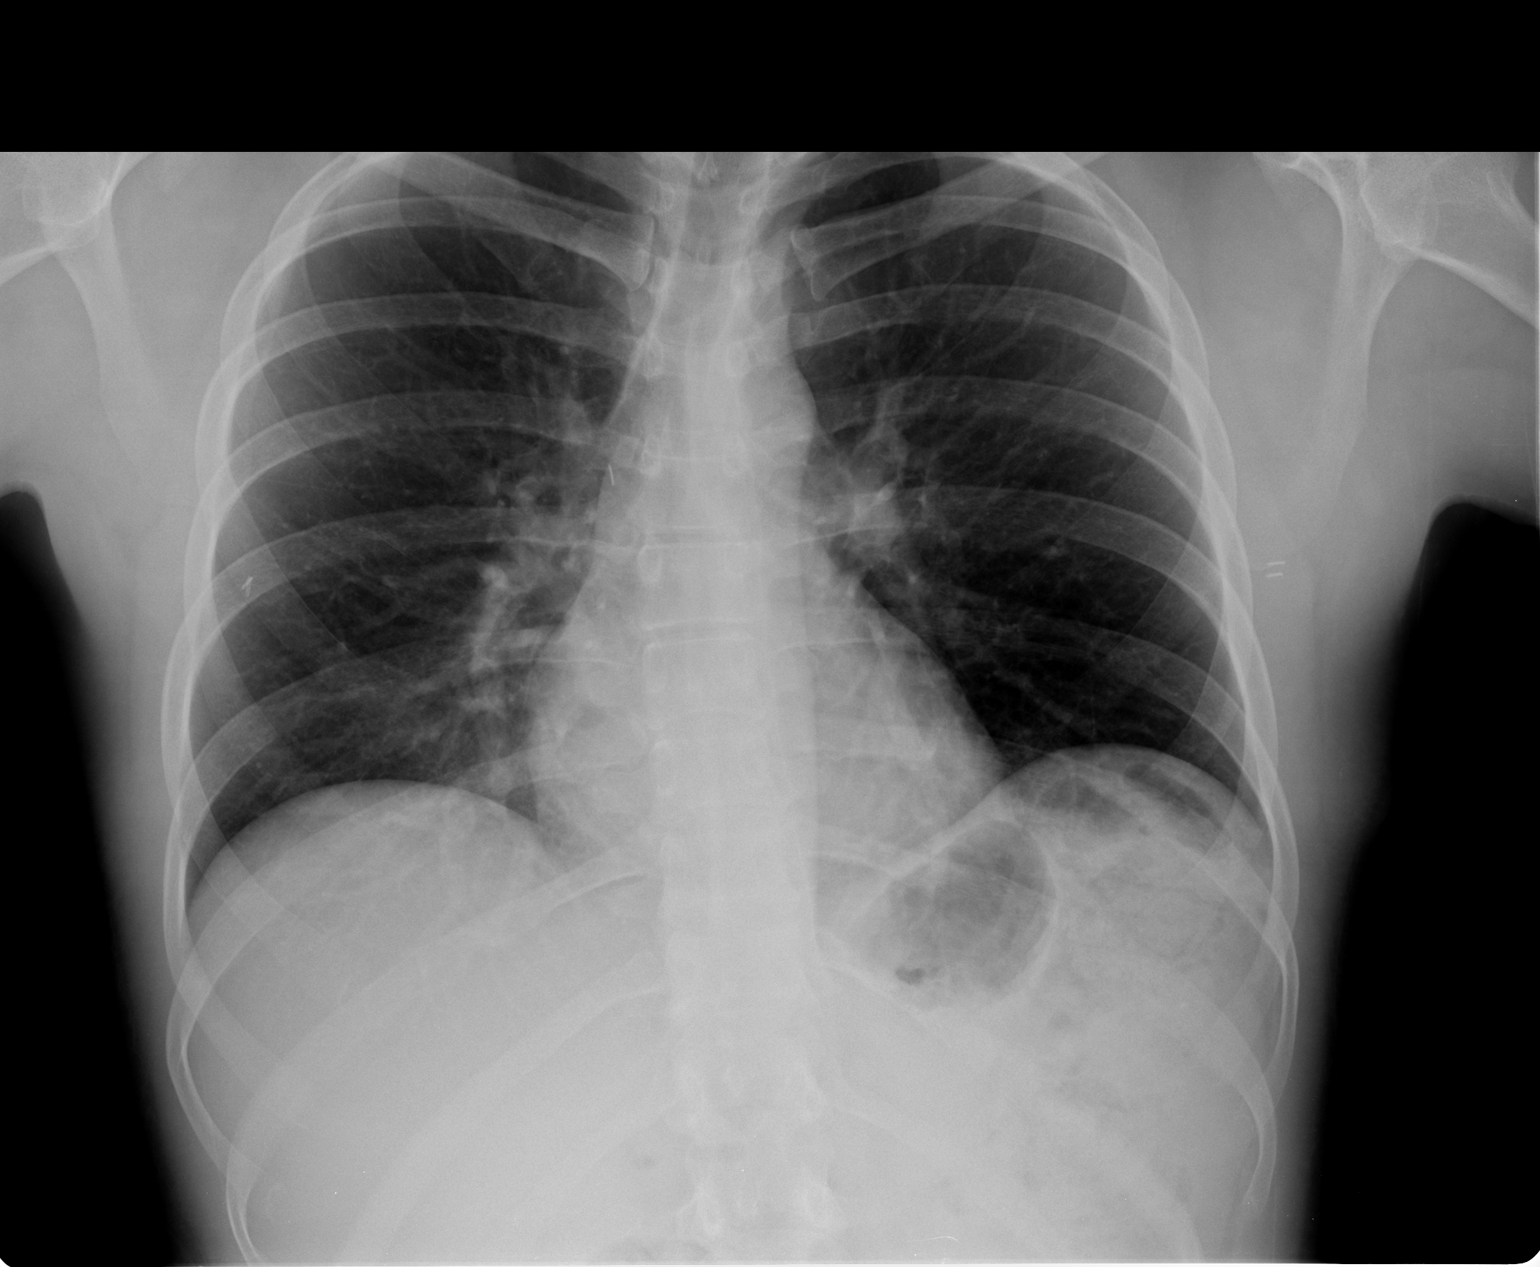

[view not recorded (2 of 2)]
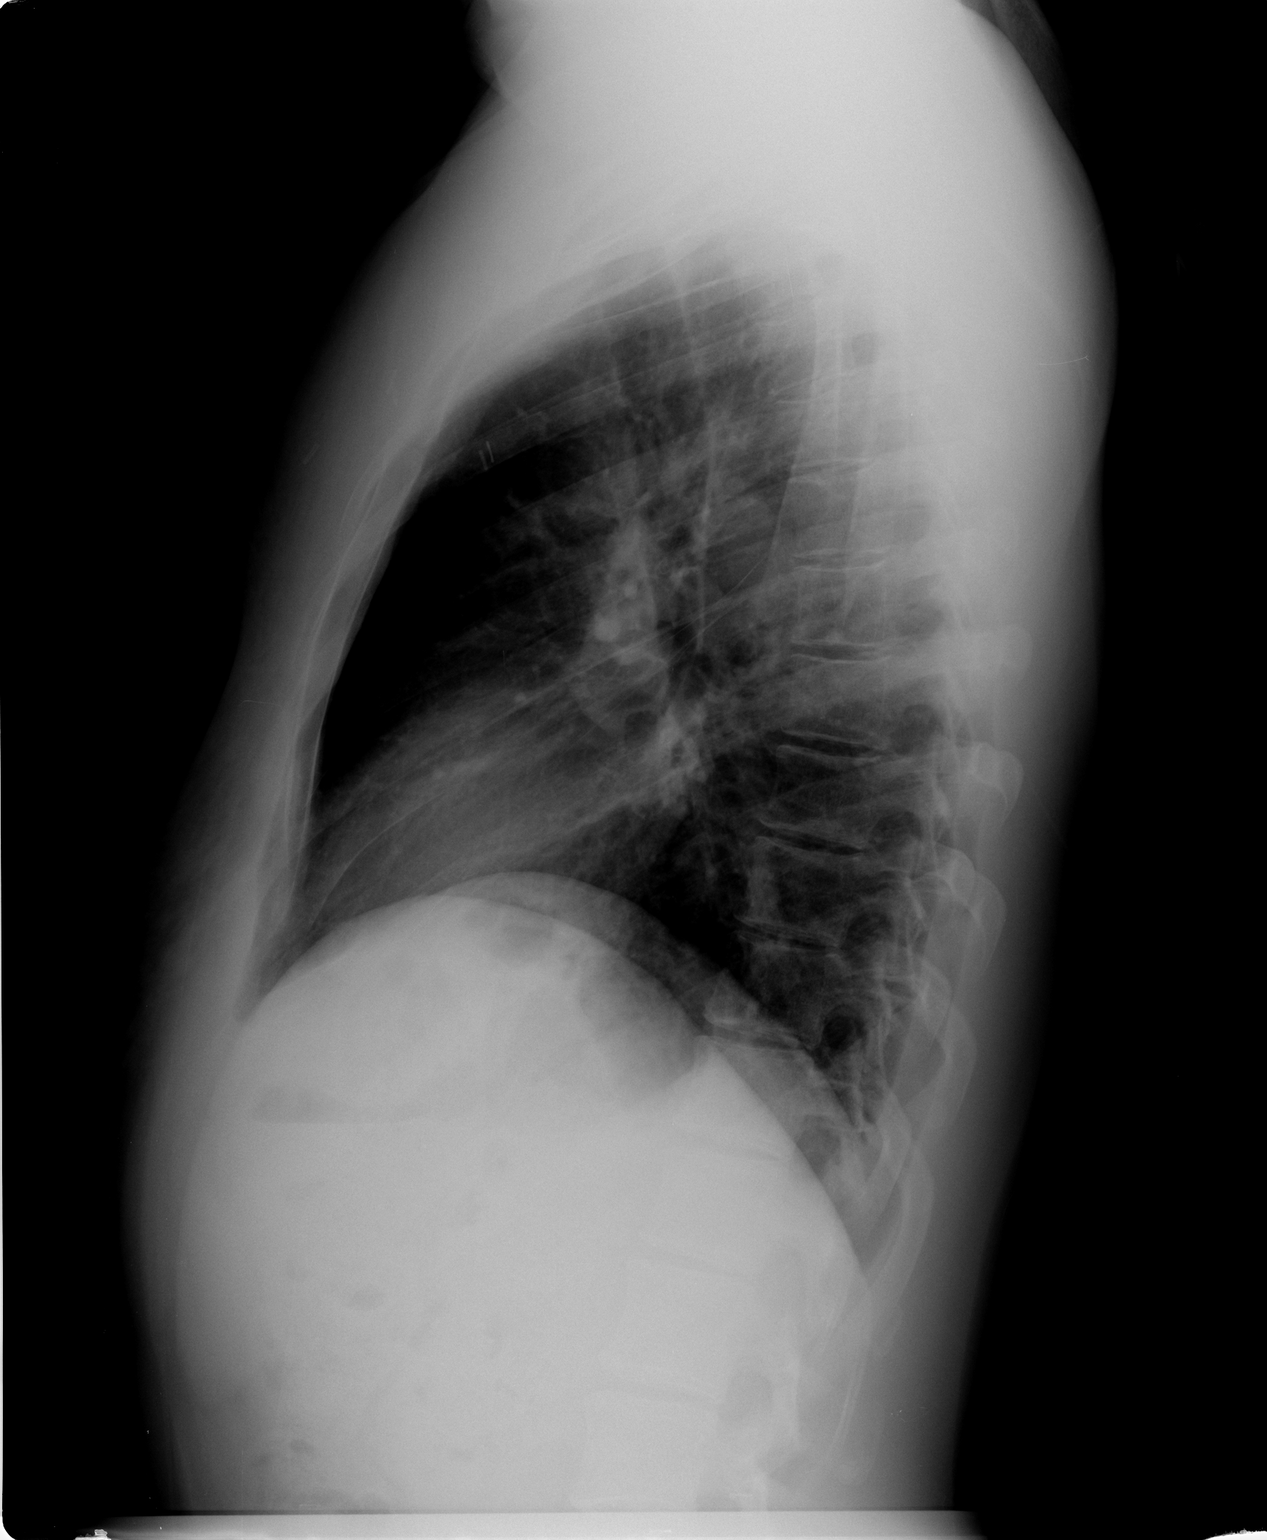

[2 of 2 positions shown; findings below may reference images not displayed]

FINDINGS: The heart size and mediastinal contours are within
normal limits.  Both lungs are clear.  The visualized skeletal
structures are unremarkable.
IMPRESSION: No active cardiopulmonary disease.

## 2012-04-03 MED ORDER — MIRTAZAPINE 15 MG PO TBDP: 15.0000 mg | ORAL_TABLET | Freq: Every day | ORAL | Status: DC

## 2012-04-03 MED ORDER — GABAPENTIN 300 MG PO CAPS: 300.0000 mg | ORAL_CAPSULE | Freq: Two times a day (BID) | ORAL | Status: DC

## 2012-04-03 MED ORDER — HYDROXYZINE HCL 50 MG PO TABS
50.0000 mg | ORAL_TABLET | Freq: Three times a day (TID) | ORAL | Status: DC | PRN
  Administered 2012-04-03: 50 mg via ORAL

## 2012-04-03 MED ORDER — DIVALPROEX SODIUM ER 500 MG PO TB24: 500.0000 mg | ORAL_TABLET | ORAL | Status: DC

## 2012-04-03 MED ORDER — AMLODIPINE BESYLATE 5 MG PO TABS
10.0000 mg | ORAL_TABLET | Freq: Every day | ORAL | Status: DC
  Administered 2012-04-04: 10 mg via ORAL
  Filled 2012-04-03 (×2): qty 1
  Filled 2012-04-03: qty 14

## 2012-04-03 MED ORDER — METOPROLOL SUCCINATE ER 50 MG PO TB24
50.0000 mg | ORAL_TABLET | Freq: Every day | ORAL | Status: DC
  Administered 2012-04-04: 50 mg via ORAL
  Filled 2012-04-03: qty 1
  Filled 2012-04-03: qty 14
  Filled 2012-04-03: qty 1

## 2012-04-03 MED ORDER — DIVALPROEX SODIUM 500 MG PO DR TAB
500.0000 mg | DELAYED_RELEASE_TABLET | Freq: Two times a day (BID) | ORAL | Status: DC
  Administered 2012-04-03 – 2012-04-04 (×2): 500 mg via ORAL
  Filled 2012-04-03: qty 28
  Filled 2012-04-03: qty 1
  Filled 2012-04-03: qty 28
  Filled 2012-04-03 (×3): qty 1

## 2012-04-03 MED ORDER — SERTRALINE HCL 100 MG PO TABS: 100.0000 mg | ORAL_TABLET | Freq: Every day | ORAL | Status: DC

## 2012-04-03 MED ORDER — MIRTAZAPINE 15 MG PO TBDP
15.0000 mg | ORAL_TABLET | Freq: Every day | ORAL | Status: DC
  Administered 2012-04-03: 15 mg via ORAL
  Filled 2012-04-03 (×2): qty 1
  Filled 2012-04-03: qty 14

## 2012-04-03 MED ORDER — METOPROLOL SUCCINATE ER 50 MG PO TB24: 50.0000 mg | ORAL_TABLET | Freq: Every day | ORAL | Status: DC

## 2012-04-03 MED ORDER — HYDROXYZINE HCL 50 MG PO TABS
50.0000 mg | ORAL_TABLET | Freq: Three times a day (TID) | ORAL | Status: DC | PRN
  Administered 2012-04-03: 50 mg via ORAL
  Filled 2012-04-03: qty 1

## 2012-04-03 MED ORDER — ADULT MULTIVITAMIN W/MINERALS CH
1.0000 | ORAL_TABLET | Freq: Every day | ORAL | Status: DC
  Administered 2012-04-04: 1 via ORAL
  Filled 2012-04-03 (×3): qty 1

## 2012-04-03 MED ORDER — RISPERIDONE 0.5 MG PO TABS: ORAL_TABLET | ORAL | Status: DC

## 2012-04-03 MED ORDER — AMLODIPINE BESYLATE 10 MG PO TABS: 10.0000 mg | ORAL_TABLET | Freq: Every day | ORAL | Status: DC

## 2012-04-03 MED ORDER — SERTRALINE HCL 100 MG PO TABS
100.0000 mg | ORAL_TABLET | Freq: Every day | ORAL | Status: DC
  Administered 2012-04-04: 100 mg via ORAL
  Filled 2012-04-03: qty 14
  Filled 2012-04-03 (×2): qty 1

## 2012-04-03 MED ORDER — RISPERIDONE 1 MG PO TABS
1.0000 mg | ORAL_TABLET | Freq: Every day | ORAL | Status: DC
  Administered 2012-04-03: 1 mg via ORAL
  Filled 2012-04-03 (×3): qty 1

## 2012-04-03 MED ORDER — ATORVASTATIN CALCIUM 40 MG PO TABS: 40.0000 mg | ORAL_TABLET | Freq: Every day | ORAL | Status: DC

## 2012-04-03 MED ORDER — GABAPENTIN 300 MG PO CAPS
300.0000 mg | ORAL_CAPSULE | Freq: Two times a day (BID) | ORAL | Status: DC
  Administered 2012-04-03 – 2012-04-04 (×2): 300 mg via ORAL
  Filled 2012-04-03 (×2): qty 1
  Filled 2012-04-03: qty 28
  Filled 2012-04-03: qty 1
  Filled 2012-04-03: qty 28
  Filled 2012-04-03: qty 1

## 2012-04-03 MED ORDER — SERTRALINE HCL 100 MG PO TABS
100.0000 mg | ORAL_TABLET | Freq: Every day | ORAL | Status: DC
Start: 2012-04-03 — End: 2012-04-03
  Filled 2012-04-03 (×2): qty 1

## 2012-04-03 MED ORDER — RISPERIDONE 0.5 MG PO TABS
0.5000 mg | ORAL_TABLET | Freq: Two times a day (BID) | ORAL | Status: DC
  Administered 2012-04-03 – 2012-04-04 (×2): 0.5 mg via ORAL
  Filled 2012-04-03 (×3): qty 1
  Filled 2012-04-03: qty 56
  Filled 2012-04-03: qty 1
  Filled 2012-04-03: qty 56

## 2012-04-03 NOTE — Discharge Planning (Signed)
[name] attended AM group.  Flat affect.  States he is feeling "OK"  Wanted confirmation of date for [facility].  Said its OK for him to stay with parents until then.  Likely d/c tomorrow.

## 2012-04-03 NOTE — Progress Notes (Addendum)
[facility] MD Progress Note  04/03/2012 11:25 AM  Diagnosis:  Axis I: Major Depression, Recurrent severe and Substance Abuse Axis II: Deferred  ADL's:  Intact  Sleep: FAIR- "slept about 5 hours, but woke up in the middle of the night". Appetite:   "improving" Depression: "depression level 8/10,.. I dont want to get out of bed." Anxiety: "very bad anxiety 13 out of 10..not got any better."  Discharge: "I am planning on going into [facility] for 30- to 60 day treatment program".  Suicidal Ideation:  Plan:  Fleeting, intermittent thoughts Intent:  Contracts for safety. Means:  no  Homicidal Ideation:  Plan:  pt denies Intent:  pt denies Means:  pt denies  AEB (as evidenced by):  Mental Status Examination/Evaluation: Objective:  Appearance: Fairly Groomed  Eye Contact::  Good  Speech:  soft, low  Volume:  Decreased  Mood:  Anxious and Depressed  Affect:  Flat  Thought Process:  Goal Directed and Linear  Orientation:  Full  Thought Content:  Hallucinations: Command:  to hurt self Visual-" shadows from the ceiling"  Suicidal Thoughts:  Yes.  without intent/plan  Homicidal Thoughts:  No  Memory:  Immediate;   Good Recent;   Fair Remote;   Fair  Judgement:  Impaired  Insight:  Shallow  Psychomotor Activity:  Normal  Concentration:  Fair  Recall:  Fair  Akathisia:  No  Handed:  Right  AIMS (if indicated):     Assets:  Physical Health  Sleep:  Number of Hours: 6.75    Vital Signs:Blood pressure 117/77, pulse 79, temperature 97.2 F (36.2 C), temperature source Oral, resp. rate 20, height 5\' 6"  (1.676 m), weight 85.276 kg (188 lb). Current Medications: Current Facility-Administered Medications  Medication Dose Route Frequency Provider Last Rate Last Dose  . acetaminophen (TYLENOL) tablet 650 mg  650 mg Oral Q6H PRN Cleotis Nipper, MD   650 mg at 03/30/12 1651  . alum & mag hydroxide-simeth (MAALOX/MYLANTA) 200-200-20 MG/5ML suspension 30 mL  30 mL Oral Q4H PRN Cleotis Nipper, MD       . amLODipine (NORVASC) tablet 10 mg  10 mg Oral Daily Cleotis Nipper, MD   10 mg at 04/03/12 0814  . chlordiazePOXIDE (LIBRIUM) capsule 25 mg  25 mg Oral Daily Kerry Hough, PA   25 mg at 04/03/12 0814  . divalproex (DEPAKOTE) DR tablet 500 mg  500 mg Oral Q12H Cleotis Nipper, MD   500 mg at 04/03/12 0814  . gabapentin (NEURONTIN) capsule 300 mg  300 mg Oral BID Cleotis Nipper, MD   300 mg at 04/03/12 0814  . magnesium hydroxide (MILK OF MAGNESIA) suspension 30 mL  30 mL Oral Daily PRN Cleotis Nipper, MD      . metoprolol succinate (TOPROL-XL) 24 hr tablet 50 mg  50 mg Oral Daily Cleotis Nipper, MD   50 mg at 04/03/12 0813  . mirtazapine (REMERON SOL-TAB) disintegrating tablet 15 mg  15 mg Oral QHS Mickie D. Adams, PA   15 mg at 04/02/12 2145  . multivitamin with minerals tablet 1 tablet  1 tablet Oral Daily Kerry Hough, PA   1 tablet at 04/03/12 0813  . nicotine (NICODERM CQ - dosed in mg/24 hours) patch 21 mg  21 mg Transdermal Q0600 Cleotis Nipper, MD   21 mg at 04/03/12 0606  . risperiDONE (RISPERDAL) tablet 0.5 mg  0.5 mg Oral BID Verne Spurr, PA-C   0.5 mg at 04/03/12 0814  .  risperiDONE (RISPERDAL) tablet 1 mg  1 mg Oral QHS Verne Spurr, PA-C   1 mg at 04/02/12 2145  . sertraline (ZOLOFT) tablet 50 mg  50 mg Oral Daily Alyson Kuroski-Mazzei, DO   50 mg at 04/03/12 0814  . thiamine (VITAMIN B-1) tablet 100 mg  100 mg Oral Daily Kerry Hough, PA   100 mg at 04/03/12 1610    Lab Results:  Results for orders placed during the hospital encounter of 03/29/12 (from the past 48 hour(s))  VALPROIC ACID LEVEL     Status: Normal   Collection Time   04/03/12  6:32 AM      Component Value Range Comment   Valproic Acid Lvl 53.8  50.0 - 100.0 ug/mL     Physical Findings: AIMS: Facial and Oral Movements Muscles of Facial Expression: None, normal Lips and Perioral Area: None, normal Jaw: None, normal Tongue: None, normal,Extremity Movements Upper (arms, wrists, hands, fingers):  shakiness Lower (legs, knees, ankles, toes): None, normal,  Trunk Movements- none Neck, shoulders, hips: None, normal, Overall Severity Severity of abnormal movements (highest score from questions above): None, normal Incapacitation due to abnormal movements: None, normal [name] awareness of abnormal movements (rate only patient's report): na Dental Status Current problems with teeth and/or dentures?: No Does patient usually wear dentures?: No  CIWA:  CIWA-Ar Total: 0  COWS:     Treatment Plan Summary:  Medication management- Increase currently prescribed zoloft 50 mg daily to 100 mg daily. Add PO Vistaril 50 mg tid PRN Continue to encourage group interaction Daily contact with patient to assess and evaluate symptoms and progress in treatment    Norval Gable FNP-BC  04/03/2012, 11:25 AM

## 2012-04-03 NOTE — Progress Notes (Signed)
Psychoeducational Group Note  Date:  04/03/2012 Time:  1100  Group Topic/Focus:  Wellness Toolbox:   The focus of this group is to discuss various aspects of wellness, balancing those aspects and exploring ways to increase the ability to experience wellness.  Patients will create a wellness toolbox for use upon discharge.  Participation Level:  Active  Participation Quality:  Appropriate  Affect:  Appropriate  Cognitive:  Appropriate  Insight:  Good  Engagement in Group:  Good  Additional Comments:  Pt was appropriate but limited in his interactions while attending group. Pt did share that his Physical Goal was to work on exercising and eating right.   Sharyn Lull 04/03/2012, 1:23 PM

## 2012-04-03 NOTE — Progress Notes (Signed)
Patient ID: [name], male   DOB: August 17, 1980, 31 y.o.   MRN: [id number] D: Pt. Reports passive SI "Off and On", "if I had a bottle of pills I'd take them." Pt. Currently contracts for safety.  Pt. Reports depression at "6" of 10. A: Staff will monitor pt. q64min for safety. Writer notes regime and let pt. Know times of administration. Staff encouraged groups.  R: Pt. Notes that he understands when meds will be administered. Pt. Is safe on the unit. Pt. Reports that he has gained coping skill from group "just if I feel suicidal come tell staff."  Pt. Attends group.

## 2012-04-03 NOTE — Progress Notes (Signed)
Patient did attend the evening speaker AA meeting.  

## 2012-04-03 NOTE — Progress Notes (Signed)
Psychoeducational Group Note  Date:  04/03/2012 Time: 1000  Group Topic/Focus:  Therapeutic Activity-"Question Ball"  Participation Level:  Active  Participation Quality:  Appropriate  Affect:  Appropriate  Cognitive:  Appropriate  Insight:  Good  Engagement in Group:  Good  Additional Comments:  Pt was appropriate and sharing while attending group. Pt was willing to answer questions when the ball was passed.  Marquis Lunch, Madina Galati 04/03/2012, 4:58 PM

## 2012-04-03 NOTE — Progress Notes (Signed)
Patient ID: [name], male   DOB: 1981-03-17, 31 y.o.   MRN: [id number] He has been up and about and to groups interacting with peers and staff.He has new medication orders and he wanted a prn for anxiety but when I went to give is he was asleep, Self inventory Sleep  Poor, energy low,attention span  Wnl, Depression 4, and hopeless  5. Sucidial off and on and he contracts for safety .

## 2012-04-03 NOTE — Progress Notes (Signed)
[facility] Group Notes:  (Counselor/Nursing/MHT/Case Management/Adjunct)    Type of Therapy:  Group Therapy 1:15 to 2:30   Participation Level:  Active  Participation Quality:  Attentive and Sharing  Affect:  Depressed  Cognitive:  Alert and Oriented  Insight:  Limited  Engagement in Group:  Good  Engagement in Therapy:  Good  Modes of Intervention:  Clarification, Education, Socialization and Support  Summary of Progress/Problems:  Group discussion focused on what patient's see as their own obstacles to recovery. [name] shared belief that maintaining sobriety may be difficult due to feelings of inadequacy he sometimes deals with.  He was given support by many as this was a difficult thing to say and others explained they did not have the courage to state it. [name] was able to process in a short time the increased wholeness he felt as a direct result of stating his truth.    [name] 04/03/2012,

## 2012-04-04 DIAGNOSIS — F1910 Other psychoactive substance abuse, uncomplicated: Secondary | ICD-10-CM

## 2012-04-04 MED ORDER — MIRTAZAPINE 15 MG PO TBDP: 15.0000 mg | ORAL_TABLET | Freq: Every day | ORAL | Status: DC

## 2012-04-04 MED ORDER — RISPERIDONE 0.5 MG PO TABS: ORAL_TABLET | ORAL | Status: DC

## 2012-04-04 MED ORDER — DIVALPROEX SODIUM ER 500 MG PO TB24
500.0000 mg | ORAL_TABLET | ORAL | Status: DC
  Filled 2012-04-04 (×2): qty 28

## 2012-04-04 NOTE — Progress Notes (Signed)
[facility] Group Notes:  (Counselor/Nursing/MHT/Case Management/Adjunct)  04/04/2012 3:54 PM  Type of Therapy:  Psychoeducational Skills  Participation Level:  Active  Participation Quality:  Appropriate, Attentive and Sharing  Affect:  Appropriate  Cognitive:  Alert, Appropriate and Oriented  Insight:  Good  Engagement in Group:  Good  Engagement in Therapy:  n/a  Modes of Intervention:  Activity, Education, Problem-solving, Socialization and Support  Summary of Progress/Problems: [name] attended Psychoeducational group on labels. Kerrion participated in activity labeling self and peers and choose to label self as a Charity fundraiser for the activity. Hiroto was quiet at times but attentive while group discussed what labels are, how we use them, and listed positive and negative labels the have used or been called. Terran was given a homework assignment to list 10 words he has been labeled and to find the reality of the word/situation.    Wandra Scot 04/04/2012, 3:54 PM

## 2012-04-04 NOTE — Progress Notes (Signed)
Discharged to care of parents. Left with a supply of meds, AVS and prescriptions. Patient received belongings and medications from locker. Denies SI/HI. Affect bright and forward thinking. He stated that he was looking forward to attending [employer]. No further questions voiced. Joice Lofts RN MS EdS 04/04/2012  4:50 PM

## 2012-04-04 NOTE — Progress Notes (Signed)
04/04/2012         Time: 1500      Group Topic/Focus: The focus of the group is on enhancing the patients' ability to cope with stressors by understanding what coping is, why it is important, the negative effects of stress and developing healthier coping skills. Patients asked to complete a fifteen minute plan, outlining three triggers, three supports, and fifteen coping activities.  Participation Level: Active  Participation Quality: Attentive  Affect: Blunted  Cognitive: Alert   Additional Comments: None.   Kore Madlock 04/04/2012 3:50 PM
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
## 2012-04-04 NOTE — Progress Notes (Signed)
Psychoeducational Group Note  Date:  04/04/2012 Time:  1100  Group Topic/Focus:  Recovery Goals:   The focus of this group is to identify appropriate goals for recovery and establish a plan to achieve them.  Participation Level:  Active  Participation Quality:  Appropriate, Drowsy and Sharing  Affect:  Appropriate  Cognitive:  Appropriate  Insight:  Good  Engagement in Group:  Good  Additional Comments:  Pt was appropriate and drowsy while attending group. Pt shared that he planned to work on self as he recovers and that he has found that he can be his biggest enemy.   Sharyn Lull 04/04/2012, 1:05 PM

## 2012-04-04 NOTE — Progress Notes (Signed)
[facility] Case Management Discharge Plan:  Will you be returning to the same living situation after discharge: No. At discharge, do you have transportation home?:Yes,  family Do you have the ability to pay for your medications:Yes,  mental health  Interagency Information:     Release of information consent forms completed and in the chart;  [name] signature needed at discharge.  Patient to Follow up at:  Follow-up Information    Follow up with [facility]. On 04/11/2012. (Arrive at 8AM sharp.)    Contact information:   [address]  [336] [phone]         Patient denies SI/HI:   Yes,  yes    Aeronautical engineer and Suicide Prevention discussed:  Yes,  yes  Barrier to discharge identified:No.  Summary and Recommendations:   [name] 04/04/2012, 10:41 AM

## 2012-04-04 NOTE — Progress Notes (Signed)
(  D) Patient reports that he slept well with sleep aids, appetite improving, energy level normal, ability to pay attention normal. He rates depression at 4/10 and feelings of hopelessness at 4/10 and denies SI/HI. No pain noted. (A) Encouraged to seek staff for support. (R) No needs voiced at this time. Joice Lofts RN MS EdS 04/04/2012  9:05 AM

## 2012-04-04 NOTE — [facility] Counselor (Signed)
[facility] Group Notes:  (Counselor/Nursing/MHT/Case Management/Adjunct)    Type of Therapy:  Group Therapy 04/04/2012 1:15 to 2:30  Participation Level:  Active  Participation Quality:  Appropriate  Affect:  Appropriate  Cognitive:  Appropriate  Insight:  Limited  Engagement in Group:  Good  Engagement in Therapy:  Good  Modes of Intervention:  Education and Support  Summary of Progress/Problems: Patient attended group presentation by staff member of  [facility] ([facility]). [name] was attentive and appropriate during session.     [name] 04/04/2012,

## 2012-04-05 NOTE — Progress Notes (Signed)
Patient Discharge Instructions:  After Visit Summary (AVS):   Faxed to:  04/05/2012 Psychiatric Admission Assessment Note:   Faxed to:  04/05/2012 Suicide Risk Assessment - Discharge Assessment:   Faxed to:  04/05/2012 Faxed/Sent to the Next Level Care provider:  04/05/2012  Faxed to [facility] point @ [phone]  Wandra Scot, 04/05/2012, 2:36 PM

## 2012-04-25 NOTE — H&P (Signed)
I agree with this note on this date. 

## 2012-05-11 NOTE — Discharge Summary (Signed)
Physician Discharge Summary Note  Patient:  [name] is an 31 y.o., male MRN:  [id number] DOB:  1980-08-23 Patient phone:  [phone] (home)  Patient address:   [address] [place] [address],   Date of Admission:  03/29/2012 Date of Discharge: 04/04/12  Reason for Admission: see H&P.  Principal Problem:  *SUBSTANCE ABUSE, MULTIPLE Active Problems:  LIVER FUNCTION TESTS, ABNORMAL  Acute alcohol intoxication  Alcohol abuse, unspecified  Discharge Diagnoses: Alcohol Use Disorder; Major Depressive Disorder - Recurrent, noted upon last admission; Mood Disorder Unspecified   Past Medical History   Diagnosis  Date   .  Seizures    .  Hypertension    .  Depression    .  Pancreatitis    .  Liver cirrhosis    .  Coronary artery disease    .  Angina    .  Shortness of breath    .  Headache    .  Peripheral vascular disease  April 2011     Left Pop    Level of Care:  [facility]  Hospital Course:  Pt admitted for crisis stabilization, detox and treatment. Pt attended all therapeutic groups, was active in his treatment planning process and agreed the current medication regimen for further stability during recovery.  There were no acute issues during treatment and he was open to further substance abuse Tx.  All labs were reviewed in great detail and medical/psychiatric needs were addressed.  No acute safety issues were noted on the unit. Medications were reviewed with pt and medication education was provided. Mental health treatment, medication management and continued sobriety will mitigate against any potential increased risk of harm to self and/or others.  Discussed the importance of recovery with pt, as well as, tools to move forward in a healthy & safe manner using the 12 Step Process.    Significant Diagnostic Studies:  See labs.  Discharge Vitals:   Blood pressure 124/83, pulse 77, temperature 97.7 F (36.5 C), temperature source Oral, resp. rate 16, height 5\' 6"  (1.676 m),  weight 85.276 kg (188 lb).  Physical Findings: AIMS: Facial and Oral Movements Muscles of Facial Expression: None, normal Lips and Perioral Area: None, normal Jaw: None, normal Tongue: None, normal,Extremity Movements Upper (arms, wrists, hands, fingers): None, normal Lower (legs, knees, ankles, toes): None, normal, Trunk Movements Neck, shoulders, hips: None, normal, Overall Severity Severity of abnormal movements (highest score from questions above): None, normal Incapacitation due to abnormal movements: None, normal [name] awareness of abnormal movements (rate only patient's report): No Awareness, Dental Status Current problems with teeth and/or dentures?: No Does patient usually wear dentures?: No  CIWA:  CIWA-Ar Total: 0  COWS:     Mental Status Exam: See Mental Status Examination and Suicide Risk Assessment completed by Attending Physician prior to discharge.  Discharge destination:  [facility]  Is patient on multiple antipsychotic therapies at discharge:  No   Has Patient had three or more failed trials of antipsychotic monotherapy by history:  No  Recommended Plan for Multiple Antipsychotic Therapies: NA   Discharge Orders    Future Appointments: Provider: Department: Dept Phone: Center:   01/12/2013 3:00 PM [facility] [phone] [facility]   01/12/2013 3:30 PM [facility] [phone] [facility]   01/12/2013 4:00 PM Evern Bio, NP [facility] [phone] [facility]     Future Orders Please Complete By Expires   Diet - low sodium heart healthy  Medication List     As of 05/11/2012  6:41 PM    STOP taking these medications         OLANZapine 5 MG tablet   Commonly known as: ZYPREXA      thiamine 100 MG tablet      traZODone 100 MG tablet   Commonly known as: DESYREL      TAKE these medications      Indication    amLODipine 10 MG tablet   Commonly known as: NORVASC   Take 1 tablet (10 mg total) by mouth daily.  For blood pressure    Indication: High Blood Pressure      atorvastatin 40 MG tablet   Commonly known as: LIPITOR   Take 1 tablet (40 mg total) by mouth daily. For high cholesterol       divalproex 500 MG 24 hr tablet   Commonly known as: DEPAKOTE ER   Take 1 tablet (500 mg total) by mouth 2 (two) times daily in the am and at bedtime.. For mood    Indication: mood stabilization      gabapentin 300 MG capsule   Commonly known as: NEURONTIN   Take 1 capsule (300 mg total) by mouth 2 (two) times daily. For anxiety       metoprolol succinate 50 MG 24 hr tablet   Commonly known as: TOPROL-XL   Take 1 tablet (50 mg total) by mouth daily. For blood pressure    Indication: Feeling Anxious, High Blood Pressure      mirtazapine 15 MG disintegrating tablet   Commonly known as: REMERON SOL-TAB   Take 1 tablet (15 mg total) by mouth at bedtime. For depression/sleep       risperiDONE 0.5 MG tablet   Commonly known as: RISPERDAL   Take 1 tablet (0.5mg ) twice a day and 2 tablets (1mg )at bedtime for thoughts       sertraline 100 MG tablet   Commonly known as: ZOLOFT   Take 1 tablet (100 mg total) by mouth daily. For depression            Follow-up Information    Follow up with [facility]. On 04/11/2012. (Arrive at 8AM sharp.)    Contact information:   [address]  [[phone]] [phone]        Plan Of Care/Follow-up recommendations: Pt seen and evaluated in treatment team. Chart reviewed. Pt stable for and requesting discharge home with parents with transition to [facility] next wk. Pt contracting for safety and does not currently meet [place] involuntary commitment criteria for continued hospitalization against his will. Mental health treatment, medication management and continued sobriety will mitigate against the increased risk of harm to self and/or others. Discussed the importance of recovery further with pt, as well as, tools to move forward in a healthy & safe manner. Pt agreeable  with the plan. Discussed with the team. Recommend follow up with AA. Diet: Heart Healthy. Activity: As tolerated.  Signed: Lupe Carney 05/11/2012, 6:41 PM

## 2012-06-26 ENCOUNTER — Emergency Department (HOSPITAL_COMMUNITY)
Admission: EM | Admit: 2012-06-26 | Discharge: 2012-06-28 | Disposition: A | Discharge status: Home / Self Care | Payer: Self-pay | Attending: Emergency Medicine | Admitting: Emergency Medicine

## 2012-06-26 DIAGNOSIS — F17200 Nicotine dependence, unspecified, uncomplicated: Secondary | ICD-10-CM | POA: Insufficient documentation

## 2012-06-26 DIAGNOSIS — F10929 Alcohol use, unspecified with intoxication, unspecified: Secondary | ICD-10-CM

## 2012-06-26 DIAGNOSIS — I10 Essential (primary) hypertension: Secondary | ICD-10-CM | POA: Insufficient documentation

## 2012-06-26 DIAGNOSIS — I209 Angina pectoris, unspecified: Secondary | ICD-10-CM | POA: Insufficient documentation

## 2012-06-26 DIAGNOSIS — G40909 Epilepsy, unspecified, not intractable, without status epilepticus: Secondary | ICD-10-CM | POA: Insufficient documentation

## 2012-06-26 DIAGNOSIS — F1410 Cocaine abuse, uncomplicated: Secondary | ICD-10-CM | POA: Insufficient documentation

## 2012-06-26 DIAGNOSIS — F339 Major depressive disorder, recurrent, unspecified: Secondary | ICD-10-CM | POA: Insufficient documentation

## 2012-06-26 DIAGNOSIS — K7030 Alcoholic cirrhosis of liver without ascites: Secondary | ICD-10-CM | POA: Insufficient documentation

## 2012-06-26 DIAGNOSIS — T394X2A Poisoning by antirheumatics, not elsewhere classified, intentional self-harm, initial encounter: Secondary | ICD-10-CM | POA: Insufficient documentation

## 2012-06-26 DIAGNOSIS — F1010 Alcohol abuse, uncomplicated: Secondary | ICD-10-CM | POA: Insufficient documentation

## 2012-06-26 DIAGNOSIS — I2510 Atherosclerotic heart disease of native coronary artery without angina pectoris: Secondary | ICD-10-CM | POA: Insufficient documentation

## 2012-06-26 DIAGNOSIS — T391X1A Poisoning by 4-Aminophenol derivatives, accidental (unintentional), initial encounter: Secondary | ICD-10-CM | POA: Insufficient documentation

## 2012-06-26 DIAGNOSIS — Z79899 Other long term (current) drug therapy: Secondary | ICD-10-CM | POA: Insufficient documentation

## 2012-06-26 DIAGNOSIS — R45851 Suicidal ideations: Secondary | ICD-10-CM | POA: Insufficient documentation

## 2012-06-26 DIAGNOSIS — T1491XA Suicide attempt, initial encounter: Secondary | ICD-10-CM

## 2012-06-26 DIAGNOSIS — I739 Peripheral vascular disease, unspecified: Secondary | ICD-10-CM | POA: Insufficient documentation

## 2012-06-26 LAB — URINALYSIS, ROUTINE W REFLEX MICROSCOPIC
Ketones, ur: NEGATIVE mg/dL
Leukocytes, UA: NEGATIVE
Protein, ur: NEGATIVE mg/dL
Urobilinogen, UA: 0.2 mg/dL (ref 0.0–1.0)

## 2012-06-26 LAB — RAPID URINE DRUG SCREEN, HOSP PERFORMED: Opiates: NOT DETECTED

## 2012-06-26 NOTE — ED Notes (Signed)
Per EMS pt drank 1 bottle of wine and ingested some benadryl tablets, 6 missing from the packet, unknown how many he actually took.  Pt initially stated he took 16 tabs   Pt is alert and oriented x 3  Answering questions appropriately  C/o nausea and general weakness   Pt states he is suicidal  Pt here voluntarily

## 2012-06-26 NOTE — ED Notes (Signed)
WGN:FAOZ<HY> Expected date:06/26/12<BR> Expected time:10:16 PM<BR> Means of arrival:Ambulance<BR> Comments:<BR> Ingestion-wine and sleeping pills

## 2012-06-27 ENCOUNTER — Encounter (HOSPITAL_COMMUNITY): Payer: Self-pay | Admitting: Family Medicine

## 2012-06-27 DIAGNOSIS — F1020 Alcohol dependence, uncomplicated: Secondary | ICD-10-CM

## 2012-06-27 DIAGNOSIS — F1994 Other psychoactive substance use, unspecified with psychoactive substance-induced mood disorder: Secondary | ICD-10-CM

## 2012-06-27 LAB — ETHANOL: Alcohol, Ethyl (B): 348 mg/dL — ABNORMAL HIGH (ref 0–11)

## 2012-06-27 LAB — COMPREHENSIVE METABOLIC PANEL
Albumin: 3.9 g/dL (ref 3.5–5.2)
BUN: 8 mg/dL (ref 6–23)
Calcium: 9.2 mg/dL (ref 8.4–10.5)
Creatinine, Ser: 0.96 mg/dL (ref 0.50–1.35)
GFR calc Af Amer: >90 mL/min (ref >90)
Potassium: 3.4 mEq/L — ABNORMAL LOW (ref 3.5–5.1)
Total Protein: 7.2 g/dL (ref 6.0–8.3)

## 2012-06-27 LAB — CBC
HCT: 43.6 % (ref 39.0–52.0)
MCH: 29.7 pg (ref 26.0–34.0)
MCHC: 36.2 g/dL — ABNORMAL HIGH (ref 30.0–36.0)
MCV: 82 fL (ref 78.0–100.0)
RDW: 14.5 % (ref 11.5–15.5)

## 2012-06-27 LAB — ACETAMINOPHEN LEVEL: Acetaminophen (Tylenol), Serum: <15 ug/mL (ref 10–30)

## 2012-06-27 LAB — SALICYLATE LEVEL: Salicylate Lvl: <2 mg/dL — ABNORMAL LOW (ref 2.8–20.0)

## 2012-06-27 MED ORDER — MIRTAZAPINE 30 MG PO TABS
30.0000 mg | ORAL_TABLET | Freq: Every day | ORAL | Status: DC
  Filled 2012-06-27: qty 1

## 2012-06-27 MED ORDER — AMLODIPINE BESYLATE 10 MG PO TABS
10.0000 mg | ORAL_TABLET | Freq: Every day | ORAL | Status: DC
  Administered 2012-06-27: 10 mg via ORAL
  Filled 2012-06-27 (×2): qty 1

## 2012-06-27 MED ORDER — LORAZEPAM 2 MG/ML IJ SOLN: 2.0000 mg | INTRAMUSCULAR | Status: DC | PRN

## 2012-06-27 MED ORDER — LORAZEPAM 1 MG PO TABS
ORAL_TABLET | ORAL | Status: AC
  Filled 2012-06-27: qty 2

## 2012-06-27 MED ORDER — LORAZEPAM 1 MG PO TABS
2.0000 mg | ORAL_TABLET | ORAL | Status: DC | PRN
  Administered 2012-06-27: 2 mg via ORAL

## 2012-06-27 MED ORDER — DIVALPROEX SODIUM ER 500 MG PO TB24
500.0000 mg | ORAL_TABLET | ORAL | Status: DC
  Administered 2012-06-27 (×2): 500 mg via ORAL
  Filled 2012-06-27 (×5): qty 1

## 2012-06-27 MED ORDER — MIRTAZAPINE 30 MG PO TABS
15.0000 mg | ORAL_TABLET | Freq: Every day | ORAL | Status: DC
  Administered 2012-06-27: 15 mg via ORAL
  Filled 2012-06-27: qty 1

## 2012-06-27 MED ORDER — METOPROLOL SUCCINATE ER 50 MG PO TB24
50.0000 mg | ORAL_TABLET | Freq: Every day | ORAL | Status: DC
  Administered 2012-06-27: 50 mg via ORAL
  Filled 2012-06-27 (×2): qty 1

## 2012-06-27 MED ORDER — ACETAMINOPHEN 325 MG PO TABS
650.0000 mg | ORAL_TABLET | Freq: Once | ORAL | Status: AC
  Administered 2012-06-27: 650 mg via ORAL
  Filled 2012-06-27: qty 2

## 2012-06-27 MED ORDER — RISPERIDONE 0.5 MG PO TABS
0.5000 mg | ORAL_TABLET | Freq: Two times a day (BID) | ORAL | Status: DC
  Administered 2012-06-27 (×2): 0.5 mg via ORAL
  Filled 2012-06-27 (×2): qty 1

## 2012-06-27 MED ORDER — ATORVASTATIN CALCIUM 40 MG PO TABS
40.0000 mg | ORAL_TABLET | Freq: Every day | ORAL | Status: DC
  Administered 2012-06-27: 40 mg via ORAL
  Filled 2012-06-27 (×2): qty 1

## 2012-06-27 MED ORDER — GABAPENTIN 300 MG PO CAPS
300.0000 mg | ORAL_CAPSULE | Freq: Two times a day (BID) | ORAL | Status: DC
  Administered 2012-06-27 (×2): 300 mg via ORAL
  Filled 2012-06-27 (×4): qty 1

## 2012-06-27 MED ORDER — SERTRALINE HCL 50 MG PO TABS
100.0000 mg | ORAL_TABLET | Freq: Every day | ORAL | Status: DC
  Administered 2012-06-27: 100 mg via ORAL
  Filled 2012-06-27: qty 2

## 2012-06-27 NOTE — ED Notes (Signed)
Bag of pt medications and driver's license placed in locker 34

## 2012-06-27 NOTE — ED Notes (Signed)
Spoke with Jovita Gamma at [facility]. Tele-Psych physician should be calling soon.

## 2012-06-27 NOTE — ED Notes (Signed)
Pts has one belonging bag in locker room #34.

## 2012-06-27 NOTE — Consult Note (Signed)
Reason for Consult: Depression, and suicidal attempt and alcohol intoxication, cocaine intoxication Referring Physician: Dr. Lorenso Courier  [name] is an 31 y.o. male.  HPI: Patient was seen and chart reviewed. Patient was presented to the [facility] long emergency department after overdosed on OTC pills of tylenol with benadryl as an attempt to kill himself. Patient reportedly was intoxicated while made suicidal attempts. Patient has a previous history of for acute psychiatric [facility] at [facility] and the [facility]  hospitalization. Patient was previously received rehabilitation services at [facility] recovery services. 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 Patient reportedly Owed $600 to probation money for the last 2 years. Patient has a history of legal charges from [place]. He does not have money and thought he was going to be put in jail. Patient decided to be intoxicated himself and try to kill himself with over the counter medication than he was scared and called the 911 for help. Patient reportedly staying with the couple of cousins in an apartment who shares rent money. Patient has been suffering with the drinking since he was 31 years old. His last drink was last night. He also abusing cocaine, last night. His urine drug screen was positive for cocaine and blood alcohol level was 348 on arrival. Patient has a mild the tremors on his hands at this time.  MSE: Patient was appeared staying in his bed watching television and has limited disstress. Patient stated mood was depressed, and his affect was constricted. He has a normal rate, rhythm, and volume of speech. His thought processes linear and goal-directed. He has denied suicidal attempt, has no evidence of psychosis or homicidal ideations, intentions, or plans. He has a poor insight,  judgment and impulse control.  Past Medical History  Diagnosis Date  . Seizures   . Hypertension   . Depression   . Pancreatitis   . Liver cirrhosis   . Coronary artery disease   . Angina   . Shortness of breath   . Headache   . Peripheral vascular disease April 2011    Left Pop    Past Surgical History  Procedure Date  . Chest surgery   . Left leg surgery   . Mastectomy     Family History  Problem Relation Age of Onset  . Stroke Other     Social History:  reports that he has been smoking Cigarettes.  He has a 1 pack-year smoking history. He has never used smokeless tobacco. He reports that he drinks about 1.2 ounces of alcohol per week. He reports that he uses illicit drugs (Cocaine and "Crack" cocaine) about once per week.  Allergies:  Allergies  Allergen Reactions  . Morphine Itching  . Penicillins Swelling    Medications: I have reviewed the patient's current medications.  Results for orders placed during the hospital encounter of 06/26/12 (from the past 48 hour(s))  URINALYSIS, ROUTINE W REFLEX MICROSCOPIC     Status: Abnormal   Collection Time   06/26/12 11:22 PM      Component Value Range Comment   Color, Urine YELLOW  YELLOW    APPearance CLEAR  CLEAR    Specific Gravity, Urine 1.003 (*) 1.005 - 1.030    pH 6.0  5.0 - 8.0    Glucose, UA NEGATIVE  NEGATIVE mg/dL    Hgb urine dipstick NEGATIVE  NEGATIVE    Bilirubin Urine NEGATIVE  NEGATIVE    Ketones, ur NEGATIVE  NEGATIVE mg/dL    Protein, ur NEGATIVE  NEGATIVE mg/dL    Urobilinogen, UA 0.2  0.0 - 1.0 mg/dL    Nitrite NEGATIVE  NEGATIVE    Leukocytes, UA NEGATIVE  NEGATIVE MICROSCOPIC NOT DONE ON URINES WITH NEGATIVE PROTEIN, BLOOD, LEUKOCYTES, NITRITE, OR GLUCOSE <1000 mg/dL.  URINE RAPID DRUG SCREEN (HOSP PERFORMED)     Status: Abnormal   Collection Time   06/26/12 11:24 PM      Component Value Range Comment   Opiates NONE DETECTED  NONE DETECTED    Cocaine POSITIVE (*) NONE DETECTED     Benzodiazepines NONE DETECTED  NONE DETECTED    Amphetamines NONE DETECTED  NONE DETECTED    Tetrahydrocannabinol NONE DETECTED  NONE DETECTED    Barbiturates NONE DETECTED  NONE DETECTED   CBC     Status: Abnormal   Collection Time   06/26/12 11:44 PM      Component Value Range Comment   WBC 5.5  4.0 - 10.5 K/uL    RBC 5.32  4.22 - 5.81 MIL/uL    Hemoglobin 15.8  13.0 - 17.0 g/dL    HCT 29.5  62.1 - 30.8 %    MCV 82.0  78.0 - 100.0 fL    MCH 29.7  26.0 - 34.0 pg    MCHC 36.2 (*) 30.0 - 36.0 g/dL    RDW 65.7  84.6 - 96.2 %    Platelets 167  150 - 400 K/uL   COMPREHENSIVE METABOLIC PANEL     Status: Abnormal   Collection Time   06/26/12 11:44 PM      Component Value Range Comment   Sodium 139  135 - 145 mEq/L    Potassium 3.4 (*) 3.5 - 5.1 mEq/L    Chloride 104  96 - 112 mEq/L    CO2 20  19 - 32 mEq/L    Glucose, Bld 74  70 - 99 mg/dL    BUN 8  6 - 23 mg/dL    Creatinine, Ser 9.52  0.50 - 1.35 mg/dL    Calcium 9.2  8.4 - 84.1 mg/dL    Total Protein 7.2  6.0 - 8.3 g/dL    Albumin 3.9  3.5 - 5.2 g/dL    AST 27  0 - 37 U/L    ALT 23  0 - 53 U/L    Alkaline Phosphatase 55  39 - 117 U/L    Total Bilirubin 0.2 (*) 0.3 - 1.2 mg/dL    GFR calc non Af Amer >90  >90 mL/min    GFR calc Af Amer >90  >90 mL/min   ETHANOL     Status: Abnormal   Collection Time   06/26/12 11:44 PM      Component Value Range Comment   Alcohol, Ethyl (B) 348 (*) 0 - 11 mg/dL   ACETAMINOPHEN LEVEL     Status: Normal   Collection Time   06/26/12 11:44 PM      Component Value Range Comment   Acetaminophen (Tylenol), Serum <15.0  10 - 30 ug/mL   SALICYLATE LEVEL     Status: Abnormal   Collection Time   06/26/12 11:44 PM      Component Value Range Comment   Salicylate Lvl <2.0 (*) 2.8 - 20.0 mg/dL   VALPROIC ACID LEVEL     Status: Abnormal   Collection Time   06/27/12 12:00 AM  Component Value Range Comment   Valproic Acid Lvl <10.0 (*) 50.0 - 100.0 ug/mL     No results found.  Positive for  anxiety, bad mood, behavior problems, depression, excessive alcohol consumption, illegal drug usage and sleep disturbance Blood pressure 115/70, pulse 94, temperature 99.1 F (37.3 C), temperature source Oral, resp. rate 18, SpO2 96.00%.   Assessment/Plan: Alcohol intoxication and dependence Cocaine abuse versus dependence Noncompliance with medications Substance-induced mood disorder Severe psychosocial stressors.  Recommendations: Patient was referred to the inpatient psychiatric hospitalization for crisis stabilization, alcohol detox treatment, and medication management. May benefit from Ativan PRN.  Aliece Honold,JANARDHAHA R. 06/27/2012, 5:15 PM

## 2012-06-27 NOTE — ED Notes (Signed)
Pt transferred from main ED, presents with c/o depression, SI.  Pt reports he ingested 6 Benadryl and 1 bottle of Wine last night.  Admits to marijuana use also.  Reports feeling hopeless.  Pt is Bipolar, follows up at [facility] outpatient.

## 2012-06-27 NOTE — ED Provider Notes (Signed)
0710.  Pt sleep in bed, note stable vital signs.  No acute overnight events reported.  He has been recommended for inpt placement.  ACT Team is working towards finding an appropriate disposition.  Will continue to follow.  Tobin Chad, MD 06/27/12 [phone]

## 2012-06-27 NOTE — ED Notes (Signed)
Awaiting Tele-Psych.

## 2012-06-27 NOTE — BH Assessment (Signed)
Assessment Note   [name] is an 31 y.o. male. Patient reportedly took 12-14 tablets of Tylenol (Benadryl was included in medications). He also drank a bottle of wine. He states he did this in attempt to kill himself. He is a long-standing history of psychiatric illness as well as suicide attempts. During today's [facility] assessment patient was not forthcoming with information. He admitted to overdosing, however; unable to provide information about what he consumed and how much he consumed. Patient admits to prior history of suicide attempts. Approximately 4-5 prior attempts (all overdoses). The current suicide attempt was related to patient loosing his job. Patient concerned about his finances and his inability to pay his bills. Patient denies HI. He also denies AVH's. Patient denies alcohol and drug use, however; lab results prove that patient is positive for cocaine and alcohol. Pt's alcohol level was 348 upon arrival. When patient was informed that his alcohol level was 348 he then stated, "I don't know maybe I had 3 glasses of wine".   Patient evaluated by Dr. Oneta Rack and inpatient hospitalization was recommended.   Axis I: Substance Induced Mood Disorder Axis II: Deferred Axis III:  Past Medical History  Diagnosis Date  . Seizures   . Hypertension   . Depression   . Pancreatitis   . Liver cirrhosis   . Coronary artery disease   . Angina   . Shortness of breath   . Headache   . Peripheral vascular disease April 2011    Left Pop   Axis IV: economic problems, other psychosocial or environmental problems and problems related to social environment Axis V: 31-40 impairment in reality testing  Past Medical History:  Past Medical History  Diagnosis Date  . Seizures   . Hypertension   . Depression   . Pancreatitis   . Liver cirrhosis   . Coronary artery disease   . Angina   . Shortness of breath   . Headache   . Peripheral vascular disease April 2011    Left Pop    Past  Surgical History  Procedure Date  . Chest surgery   . Left leg surgery   . Mastectomy     Family History:  Family History  Problem Relation Age of Onset  . Stroke Other     Social History:  reports that he has been smoking Cigarettes.  He has a 1 pack-year smoking history. He has never used smokeless tobacco. He reports that he drinks about 1.2 ounces of alcohol per week. He reports that he uses illicit drugs (Cocaine and "Crack" cocaine) about once per week.  Additional Social History:  Alcohol / Drug Use Pain Medications: SEE MAR Prescriptions: SEE MAR Over the Counter: SEE MAR History of alcohol / drug use?: Yes (pt denies SA; BAL is 348; UDS is positive for cocaine) Longest period of sobriety (when/how long): unk Substance #1 Name of Substance 1: Pt denies alcohol use; BAL is 348 1 - Age of First Use: pt denies alcohol use 1 - Amount (size/oz): Pt denies alcohol use, however; when told his ETOH level was 348 he admitted to drinking 1 glass of wine. 1 - Frequency: pt denies alcohol use 1 - Duration: pt denies alcohol use 1 - Last Use / Amount: last known date was 06/26/2012; pt denies alcohol use until tol ETOH level was 348 upon admission Substance #2 Name of Substance 2: Cocaine (pt denies use; UDS positive for cocaine; Pt also positive for cocaine 03/28/12 and 11/12/2011 2 - Age of  First Use: pt denies use 2 - Amount (size/oz): pt denies use 2 - Frequency: pt denies use 2 - Duration: pt denies use 2 - Last Use / Amount: pt denies use  CIWA: CIWA-Ar BP: 115/70 mmHg Pulse Rate: 94  COWS:    Allergies:  Allergies  Allergen Reactions  . Morphine Itching  . Penicillins Swelling    Home Medications:  (Not in a hospital admission)  OB/GYN Status:  No LMP for male patient.  General Assessment Data Location of Assessment: [facility] Living Arrangements: Other (Comment);Non-relatives/Friends (lives with a room mate) Can pt return to current living arrangement?:  No Admission Status: Voluntary Is patient capable of signing voluntary admission?: No Transfer from: Acute Hospital Referral Source: Self/Family/Friend  Education Status Is patient currently in school?: No  Risk to self Suicidal Ideation: Yes-Currently Present Suicidal Intent: Yes-Currently Present Is patient at risk for suicide?: Yes Suicidal Plan?: Yes-Currently Present (pt overdosed but unable to recall what he took or how much) Specify Current Suicidal Plan:  (OD) Access to Means: Yes Specify Access to Suicidal Means:  (prescription meds and OTC meds) What has been your use of drugs/alcohol within the last 12 months?:  (pt denies; UDS + for Cocaine; BAL is 348) Previous Attempts/Gestures: Yes How many times?:  (3x's ) Other Self Harm Risks:  (none reported) Triggers for Past Attempts: Other (Comment) ("loss of love one" and "other stuff" -pt did not specify) Intentional Self Injurious Behavior: None Family Suicide History: No Recent stressful life event(s): Other (Comment) Persecutory voices/beliefs?: No Depression: No Depression Symptoms: Feeling worthless/self pity;Loss of interest in usual pleasures Substance abuse history and/or treatment for substance abuse?: No Suicide prevention information given to non-admitted patients: Not applicable  Risk to Others Homicidal Ideation: No Thoughts of Harm to Others: No Current Homicidal Intent: No Current Homicidal Plan: No Access to Homicidal Means: No Identified Victim:  (n/a) History of harm to others?: No Assessment of Violence: None Noted Violent Behavior Description:  (pt calm and cooperative) Does patient have access to weapons?: No Criminal Charges Pending?: No Does patient have a court date: No  Psychosis Hallucinations: None noted Delusions: None noted  Mental Status Report Appear/Hygiene: Disheveled Eye Contact: Poor Motor Activity: Freedom of movement Speech: Logical/coherent Level of Consciousness:  Alert Mood: Depressed Affect: Appropriate to circumstance Anxiety Level: None Thought Processes: Coherent Judgement: Unimpaired Orientation: Person;Place;Situation;Time Obsessive Compulsive Thoughts/Behaviors: None  Cognitive Functioning Concentration: Normal Memory: Recent Intact;Remote Intact IQ: Average Insight: Poor Impulse Control: Poor Appetite: Good Weight Loss:  (none reported) Weight Gain:  (none reported) Sleep: No Change Total Hours of Sleep:  (6-8 hrs per night) Vegetative Symptoms: None  ADLScreening [facility] Assessment Services) [name] cognitive ability adequate to safely complete daily activities?: Yes Patient able to express need for assistance with ADLs?: Yes Independently performs ADLs?: Yes (appropriate for developmental age)  Abuse/Neglect [facility]) Physical Abuse: Denies Verbal Abuse: Denies Sexual Abuse: Denies  Prior Inpatient Therapy Prior Inpatient Therapy: Yes Prior Therapy Dates:  (admitted 3-4x's at [facility] in he past;unable to recall dates) Prior Therapy Facilty/Provider(s):  [facility]) Reason for Treatment:  (AVH's, depression, overdoses, med mgt, substance abuse)  Prior Outpatient Therapy Prior Outpatient Therapy: Yes Prior Therapy Dates:  (current pt at [facility]) Prior Therapy Facilty/Provider(s):  [facility]) Reason for Treatment:  (med mgt)  ADL Screening (condition at time of admission) [name] cognitive ability adequate to safely complete daily activities?: Yes Patient able to express need for assistance with ADLs?: Yes Independently performs ADLs?: Yes (appropriate for developmental age) Weakness  of Legs: None Weakness of Arms/Hands: None  Home Assistive Devices/Equipment Home Assistive Devices/Equipment: None    Abuse/Neglect Assessment (Assessment to be complete while patient is alone) Physical Abuse: Denies Verbal Abuse: Denies Sexual Abuse: Denies Exploitation of patient/patient's resources: Denies Self-Neglect: Denies Values /  Beliefs Cultural Requests During Hospitalization: None Spiritual Requests During Hospitalization: None   Advance Directives (For Healthcare) Advance Directive: Patient does not have advance directive Nutrition Screen- [facility] [name] home diet: Regular  Additional Information 1:1 In Past 12 Months?: No CIRT Risk: No Elopement Risk: No Does patient have medical clearance?: Yes     Disposition:  Disposition Disposition of Patient: Inpatient treatment program;Referred to [facility] for inpatient treatment)  On Site Evaluation by:   Reviewed with Physician:     Melynda Ripple [facility] 06/27/2012 5:18 PM

## 2012-06-27 NOTE — ED Notes (Signed)
Telepsych in progress. 

## 2012-06-27 NOTE — ED Notes (Signed)
Tele-Psych request faxed to [facility].

## 2012-06-27 NOTE — ED Provider Notes (Signed)
History     CSN: [id number]  Arrival date & time 06/26/12  2227   First MD Initiated Contact with Patient 06/26/12 2311      Chief Complaint  Patient presents with  . Suicidal  . Ingestion     The history is provided by the patient.   the patient reports that he took 12-14 tablets of Tylenol with Benadryl in it.  He also drank a bottle of wine.  He states he did this in attempt to kill himself.  He is a long-standing history of psychiatric illness as well as suicide attempts.  He denies nausea vomiting.  No abdominal pain.  He denies hallucinations.  Nothing worsens or improves his symptoms.  Symptoms are mild in severity.  He admits to alcohol use and crack cocaine use tonight.  Past Medical History  Diagnosis Date  . Seizures   . Hypertension   . Depression   . Pancreatitis   . Liver cirrhosis   . Coronary artery disease   . Angina   . Shortness of breath   . Headache   . Peripheral vascular disease April 2011    Left Pop    Past Surgical History  Procedure Date  . Chest surgery   . Left leg surgery   . Mastectomy     Family History  Problem Relation Age of Onset  . Stroke Other     History  Substance Use Topics  . Smoking status: Current Every Day Smoker -- 0.2 packs/day for 4 years    Types: Cigarettes  . Smokeless tobacco: Never Used  . Alcohol Use: 1.2 oz/week    2 Cans of beer per week     Comment: pint daily      Review of Systems  All other systems reviewed and are negative.    Allergies  Morphine and Penicillins  Home Medications   Current Outpatient Rx  Name  Route  Sig  Dispense  Refill  . MIRTAZAPINE 30 MG PO TABS   Oral   Take 30 mg by mouth at bedtime.         Marland Kitchen RISPERIDONE 1 MG PO TABS   Oral   Take 0.5 mg by mouth 2 (two) times daily.         . SERTRALINE HCL 100 MG PO TABS   Oral   Take 1 tablet (100 mg total) by mouth daily. For depression   30 tablet   0   . AMLODIPINE BESYLATE 10 MG PO TABS   Oral   Take 1  tablet (10 mg total) by mouth daily. For blood pressure   30 tablet   0   . ATORVASTATIN CALCIUM 40 MG PO TABS   Oral   Take 1 tablet (40 mg total) by mouth daily. For high cholesterol         . DIVALPROEX SODIUM ER 500 MG PO TB24   Oral   Take 1 tablet (500 mg total) by mouth 2 (two) times daily in the am and at bedtime.. For mood   90 tablet   0   . GABAPENTIN 300 MG PO CAPS   Oral   Take 1 capsule (300 mg total) by mouth 2 (two) times daily. For anxiety   60 capsule   0   . METOPROLOL SUCCINATE ER 50 MG PO TB24   Oral   Take 1 tablet (50 mg total) by mouth daily. For blood pressure   30 tablet   0  BP 141/78  Temp 98.9 F (37.2 C) (Oral)  Resp 15  SpO2 96%  Physical Exam  Nursing note and vitals reviewed. Constitutional: He is oriented to person, place, and time. He appears well-developed and well-nourished.  HENT:  Head: Normocephalic and atraumatic.  Eyes: EOM are normal.  Neck: Normal range of motion.  Cardiovascular: Normal rate, regular rhythm, normal heart sounds and intact distal pulses.   Pulmonary/Chest: Effort normal and breath sounds normal. No respiratory distress.  Abdominal: Soft. He exhibits no distension. There is no tenderness.  Musculoskeletal: Normal range of motion.  Neurological: He is alert and oriented to person, place, and time.  Skin: Skin is warm and dry.  Psychiatric: He has a normal mood and affect. Judgment normal.    ED Course  Procedures (including critical care time)  Labs Reviewed  CBC - Abnormal; Notable for the following:    MCHC 36.2 (*)     All other components within normal limits  COMPREHENSIVE METABOLIC PANEL - Abnormal; Notable for the following:    Potassium 3.4 (*)     Total Bilirubin 0.2 (*)     All other components within normal limits  ETHANOL - Abnormal; Notable for the following:    Alcohol, Ethyl (B) 348 (*)     All other components within normal limits  SALICYLATE LEVEL - Abnormal; Notable for  the following:    Salicylate Lvl <2.0 (*)     All other components within normal limits  URINALYSIS, ROUTINE W REFLEX MICROSCOPIC - Abnormal; Notable for the following:    Specific Gravity, Urine 1.003 (*)     All other components within normal limits  URINE RAPID DRUG SCREEN (HOSP PERFORMED) - Abnormal; Notable for the following:    Cocaine POSITIVE (*)     All other components within normal limits  VALPROIC ACID LEVEL - Abnormal; Notable for the following:    Valproic Acid Lvl <10.0 (*)     All other components within normal limits  ACETAMINOPHEN LEVEL   No results found.   No diagnosis found.    MDM        1:19 AM Medically clear.  I will ask the psychiatrist to evaluate the patient  6:09 AM The patient was seen and evaluated by the psychiatrist who believes the patient would benefit from inpatient psychiatric hospitalization.  Lyanne Co, MD 06/27/12 [phone]

## 2012-06-27 NOTE — ED Notes (Signed)
Spoke with Jovita Gamma at [facility] to initiate Tele-Psych. Tele-Psych doctor will call back shortly.

## 2012-06-27 NOTE — ED Provider Notes (Signed)
Aurther Loft, ACT states accepted at [facility] by Dr Dorthy Cooler, MD 06/27/12 [phone]

## 2012-06-28 ENCOUNTER — Encounter (HOSPITAL_COMMUNITY): Payer: Self-pay | Admitting: *Deleted

## 2012-06-28 ENCOUNTER — Inpatient Hospital Stay (HOSPITAL_COMMUNITY)
Admission: AD | Admit: 2012-06-28 | Discharge: 2012-07-04 | DRG: 885 | Disposition: A | Discharge status: Home / Self Care | Payer: Federal, State, Local not specified - Other | Source: Ambulatory Visit | Attending: Psychiatry | Admitting: Psychiatry

## 2012-06-28 DIAGNOSIS — R945 Abnormal results of liver function studies: Secondary | ICD-10-CM

## 2012-06-28 DIAGNOSIS — F339 Major depressive disorder, recurrent, unspecified: Principal | ICD-10-CM

## 2012-06-28 DIAGNOSIS — I10 Essential (primary) hypertension: Secondary | ICD-10-CM

## 2012-06-28 DIAGNOSIS — I739 Peripheral vascular disease, unspecified: Secondary | ICD-10-CM

## 2012-06-28 DIAGNOSIS — I2510 Atherosclerotic heart disease of native coronary artery without angina pectoris: Secondary | ICD-10-CM | POA: Diagnosis present

## 2012-06-28 DIAGNOSIS — K7460 Unspecified cirrhosis of liver: Secondary | ICD-10-CM | POA: Diagnosis present

## 2012-06-28 DIAGNOSIS — E7800 Pure hypercholesterolemia, unspecified: Secondary | ICD-10-CM

## 2012-06-28 DIAGNOSIS — F1010 Alcohol abuse, uncomplicated: Secondary | ICD-10-CM

## 2012-06-28 DIAGNOSIS — F1910 Other psychoactive substance abuse, uncomplicated: Secondary | ICD-10-CM

## 2012-06-28 DIAGNOSIS — F10929 Alcohol use, unspecified with intoxication, unspecified: Secondary | ICD-10-CM

## 2012-06-28 DIAGNOSIS — Z79899 Other long term (current) drug therapy: Secondary | ICD-10-CM

## 2012-06-28 DIAGNOSIS — F1994 Other psychoactive substance use, unspecified with psychoactive substance-induced mood disorder: Secondary | ICD-10-CM

## 2012-06-28 DIAGNOSIS — R569 Unspecified convulsions: Secondary | ICD-10-CM

## 2012-06-28 LAB — COMPREHENSIVE METABOLIC PANEL
AST: 21 U/L (ref 0–37)
Albumin: 3.3 g/dL — ABNORMAL LOW (ref 3.5–5.2)
Alkaline Phosphatase: 54 U/L (ref 39–117)
CO2: 25 mEq/L (ref 19–32)
Chloride: 98 mEq/L (ref 96–112)
Creatinine, Ser: 0.92 mg/dL (ref 0.50–1.35)
GFR calc non Af Amer: >90 mL/min (ref >90)
Potassium: 3.5 mEq/L (ref 3.5–5.1)
Total Bilirubin: 0.3 mg/dL (ref 0.3–1.2)

## 2012-06-28 MED ORDER — CHLORDIAZEPOXIDE HCL 25 MG PO CAPS
25.0000 mg | ORAL_CAPSULE | Freq: Four times a day (QID) | ORAL | Status: AC
  Administered 2012-06-28 – 2012-06-29 (×6): 25 mg via ORAL
  Filled 2012-06-28 (×6): qty 1

## 2012-06-28 MED ORDER — CHLORDIAZEPOXIDE HCL 25 MG PO CAPS
25.0000 mg | ORAL_CAPSULE | Freq: Three times a day (TID) | ORAL | Status: AC
  Administered 2012-06-29 – 2012-06-30 (×3): 25 mg via ORAL
  Filled 2012-06-28 (×3): qty 1

## 2012-06-28 MED ORDER — ADULT MULTIVITAMIN W/MINERALS CH
1.0000 | ORAL_TABLET | Freq: Every day | ORAL | Status: DC
  Administered 2012-06-28 – 2012-07-04 (×7): 1 via ORAL
  Filled 2012-06-28 (×9): qty 1

## 2012-06-28 MED ORDER — MAGNESIUM HYDROXIDE 400 MG/5ML PO SUSP: 30.0000 mL | Freq: Every day | ORAL | Status: DC | PRN

## 2012-06-28 MED ORDER — VITAMIN B-1 100 MG PO TABS
100.0000 mg | ORAL_TABLET | Freq: Every day | ORAL | Status: DC
  Administered 2012-06-28 – 2012-07-04 (×7): 100 mg via ORAL
  Filled 2012-06-28 (×9): qty 1

## 2012-06-28 MED ORDER — ATORVASTATIN CALCIUM 40 MG PO TABS
40.0000 mg | ORAL_TABLET | Freq: Every day | ORAL | Status: DC
  Administered 2012-06-28 – 2012-07-04 (×7): 40 mg via ORAL
  Filled 2012-06-28 (×9): qty 1

## 2012-06-28 MED ORDER — THIAMINE HCL 100 MG/ML IJ SOLN
100.0000 mg | Freq: Once | INTRAMUSCULAR | Status: AC
  Administered 2012-06-28: 100 mg via INTRAMUSCULAR

## 2012-06-28 MED ORDER — MIRTAZAPINE 30 MG PO TABS
30.0000 mg | ORAL_TABLET | Freq: Every day | ORAL | Status: DC
  Administered 2012-06-28: 30 mg via ORAL
  Filled 2012-06-28 (×2): qty 1

## 2012-06-28 MED ORDER — ALUM & MAG HYDROXIDE-SIMETH 200-200-20 MG/5ML PO SUSP
30.0000 mL | ORAL | Status: DC | PRN
  Administered 2012-06-29: 30 mL via ORAL

## 2012-06-28 MED ORDER — ACETAMINOPHEN 325 MG PO TABS
650.0000 mg | ORAL_TABLET | Freq: Four times a day (QID) | ORAL | Status: DC | PRN
  Administered 2012-06-28 (×2): 650 mg via ORAL

## 2012-06-28 MED ORDER — RISPERIDONE 0.5 MG PO TABS
0.5000 mg | ORAL_TABLET | Freq: Two times a day (BID) | ORAL | Status: DC
  Administered 2012-06-28 – 2012-07-01 (×8): 0.5 mg via ORAL
  Filled 2012-06-28 (×12): qty 1

## 2012-06-28 MED ORDER — CHLORDIAZEPOXIDE HCL 25 MG PO CAPS
25.0000 mg | ORAL_CAPSULE | Freq: Once | ORAL | Status: AC
  Administered 2012-06-28: 25 mg via ORAL
  Filled 2012-06-28: qty 1

## 2012-06-28 MED ORDER — CHLORDIAZEPOXIDE HCL 25 MG PO CAPS: 25.0000 mg | ORAL_CAPSULE | Freq: Four times a day (QID) | ORAL | Status: AC | PRN

## 2012-06-28 MED ORDER — ONDANSETRON 4 MG PO TBDP: 4.0000 mg | ORAL_TABLET | Freq: Four times a day (QID) | ORAL | Status: AC | PRN

## 2012-06-28 MED ORDER — AMLODIPINE BESYLATE 10 MG PO TABS
10.0000 mg | ORAL_TABLET | Freq: Every day | ORAL | Status: DC
  Administered 2012-06-28 – 2012-07-04 (×7): 10 mg via ORAL
  Filled 2012-06-28 (×7): qty 1
  Filled 2012-06-28: qty 2
  Filled 2012-06-28 (×2): qty 1

## 2012-06-28 MED ORDER — HYDROXYZINE HCL 25 MG PO TABS
25.0000 mg | ORAL_TABLET | Freq: Four times a day (QID) | ORAL | Status: AC | PRN
  Administered 2012-06-28 – 2012-06-29 (×2): 25 mg via ORAL

## 2012-06-28 MED ORDER — CHLORDIAZEPOXIDE HCL 25 MG PO CAPS
25.0000 mg | ORAL_CAPSULE | ORAL | Status: AC
  Administered 2012-06-30 – 2012-07-01 (×2): 25 mg via ORAL
  Filled 2012-06-28 (×2): qty 1

## 2012-06-28 MED ORDER — METOPROLOL SUCCINATE ER 50 MG PO TB24
50.0000 mg | ORAL_TABLET | Freq: Every day | ORAL | Status: DC
  Administered 2012-06-28 – 2012-07-04 (×7): 50 mg via ORAL
  Filled 2012-06-28 (×10): qty 1

## 2012-06-28 MED ORDER — LOPERAMIDE HCL 2 MG PO CAPS: 2.0000 mg | ORAL_CAPSULE | ORAL | Status: AC | PRN

## 2012-06-28 MED ORDER — DIVALPROEX SODIUM ER 250 MG PO TB24
500.0000 mg | ORAL_TABLET | ORAL | Status: DC
  Administered 2012-06-28 – 2012-07-04 (×13): 500 mg via ORAL
  Filled 2012-06-28 (×18): qty 1

## 2012-06-28 MED ORDER — SERTRALINE HCL 100 MG PO TABS
100.0000 mg | ORAL_TABLET | Freq: Every day | ORAL | Status: DC
  Administered 2012-06-28 – 2012-06-29 (×2): 100 mg via ORAL
  Filled 2012-06-28 (×4): qty 1

## 2012-06-28 MED ORDER — NICOTINE 21 MG/24HR TD PT24
21.0000 mg | MEDICATED_PATCH | Freq: Every day | TRANSDERMAL | Status: DC
  Administered 2012-06-28 – 2012-07-03 (×6): 21 mg via TRANSDERMAL
  Filled 2012-06-28 (×10): qty 1

## 2012-06-28 MED ORDER — CHLORDIAZEPOXIDE HCL 25 MG PO CAPS
25.0000 mg | ORAL_CAPSULE | Freq: Every day | ORAL | Status: AC
  Administered 2012-07-02: 25 mg via ORAL
  Filled 2012-06-28: qty 1

## 2012-06-28 NOTE — Progress Notes (Signed)
D: Patient In day room on approach.  Patient states he did go to one group today.  Patient states he did go to dinner.  Patient states he has been depressed and sad because he lost his job due to cut backs at work and he does not know what he is going to do.  Patient is cooperative during interview.  Patient rates depression 5/10.  Patient is currently passive SI but verbally contracts for safety.  Patient denies HI and denies AVH. A: Staff to monitor Q 15 mins for safety.  Encouragement and support offered. Scheduled medications administered per orders. Tylenol prn administered for a headache. R: Patient remains safe on the unit,  Patient visible on the unit but not interacting with peers.  Patient calm, cooperative and taking administered medications.

## 2012-06-28 NOTE — Progress Notes (Signed)
Adult Psychosocial Assessment Update Interdisciplinary Team  Previous [facility] admissions/discharges:  Admissions Discharges  Date:03/29/12 Date: 04/04/12  Date: Date:  Date: Date:  Date: Date:  Date: Date:   Changes since the last Psychosocial Assessment (including adherence to outpatient mental health and/or substance abuse treatment, situational issues contributing to decompensation and/or relapse). Patient reports his hours at work have been cut and he will be losing his apartment  Soon.  He will return to the apartment at discharge.  He also reports relapsing on  Alcohol.         Discharge Plan 1. Will you be returning to the same living situation after discharge?   Yes: No:      If no, what is your plan?    Patient will return to his apartment until his lease expires.       2. Would you like a referral for services when you are discharged? Yes:     If yes, for what services?  No:        Patient reports he is followed by [facility] for medications and [facility] for   Counseling.     Summary and Recommendations (to be completed by the evaluator) Moo Gravley is a 31 years old African American male admitted with Substance Induced  Mood Disorder.  He will Patient will benefit from crisis stabilization, evaluation for medication management, psycho education groups for coping skills development, group therapy and assistance with discharge planning.                      Signature:  [name], 06/28/2012 4:21 PM

## 2012-06-28 NOTE — Progress Notes (Signed)
[facility] LCSW Aftercare Discharge Planning Group Note  8:30 - 9:30  06/28/2012 3:34 PM  Participation Quality:    Affect:   Cognitive:    Insight:    Engagement in Group:    Modes of Intervention:    Summary of Progress/Problems: Patient did not attend group.  [name] 06/28/2012, 3:34 PM

## 2012-06-28 NOTE — Tx Team (Addendum)
Interdisciplinary Treatment Plan Update (Adult)  Date:  06/28/2012  Time Reviewed:  9:51 AM   Progress in Treatment: Attending groups:   Yes   Participating in groups:  Yes Taking medication as prescribed:  Yes Tolerating medication:  Yes Family/Significant othe contact made: Contact to be made with family Patient understands diagnosis:  Yes Discussing patient identified problems/goals with staff: Yes Medical problems stabilized or resolved: Yes Denies suicidal/homicidal ideation:Yes Issues/concerns per patient self-inventory:  Other:   New problem(s) identified:  Reason for Continuation of Hospitalization: Anxiety Depression Medication stabilization Detox  Interventions implemented related to continuation of hospitalization:  Medication Management; safety checks q 15 mins  Additional comments:  Estimated length of stay:  2-3 days  Discharge Plan:  Home with outpatient follow up  New goal(s):  Review of initial/current patient goals per problem list:    1.  Goal(s): Eliminate SI/other thoughts of self harm   Met:  Yes  Target date: d/c  As evidenced by: Patient no longer endorsing SI/HI or other thoughts of self harm.    2.  Goal (s):Reduce depression/anxiety (rates at five today  Met: Yes  Target date: d/c  As evidenced by: Patient currently rating symptoms at four or below    3.  Goal(s):.stabilize on meds   Met:  No  Target date: d/c  As evidenced by: Patient reports being stabilized on medications - less symptomatic    4.  Goal(s): Detox    Met:  No  Target date: d/c  As evidenced by: Patient will complete detox protocol    Attendees: Patient:   06/28/2012 9:51 AM  Physican:  Patrick North, MD 06/28/2012 9:51 AM  Nursing:   Leighton Parody, RN 06/28/2012 9:51 AM   Nursing:   Harold Barban, RN 06/28/2012 9:51 AM   Clinical Social Worker:  Juline Patch, LCSW 06/28/2012 9:51 AM   Other: Serena Colonel, FNP 06/28/2012 9:51 AM    Other:  Patton Salles, Clinical Social Worker, LCSW  06/28/2012 9:51 AM Other:        06/28/2012 9:51 AM
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
## 2012-06-28 NOTE — Tx Team (Deleted)
Initial Interdisciplinary Treatment Plan  PATIENT STRENGTHS: (choose at least two) Ability for insight Average or above average intelligence Motivation for treatment/growth Physical Health Supportive family/friends  PATIENT STRESSORS: Financial difficulties Marital or family conflict Substance abuse   PROBLEM LIST: Problem List/Patient Goals Date to be addressed Date deferred Reason deferred Estimated date of resolution  Suicide Attempt 06/28/12     Substance Abuse 06/28/12                                                DISCHARGE CRITERIA:  Adequate post-discharge living arrangements Improved stabilization in mood, thinking, and/or behavior Motivation to continue treatment in a less acute level of care Withdrawal symptoms are absent or subacute and managed without 24-hour nursing intervention  PRELIMINARY DISCHARGE PLAN: Attend aftercare/continuing care group Attend 12-step recovery group Outpatient therapy Return to previous living arrangement  PATIENT/FAMIILY INVOLVEMENT: This treatment plan has been presented to and reviewed with the patient, [name], and/or family member.  The patient and family have been given the opportunity to ask questions and make suggestions.  Roselie Skinner [facility] 06/28/2012, 5:49 AM

## 2012-06-28 NOTE — Progress Notes (Signed)
[facility] Group Notes:  (Counselor/Nursing/MHT/Case Management/Adjunct)  06/28/2012 4:34 PM  Type of Therapy:  Psychoeducational Skills  Participation Level:  Did Not Attend  Summary of Progress/Problems: [name] did not attend psychoeducational group on labels.    Wandra Scot 06/28/2012, 4:34 PM

## 2012-06-28 NOTE — Progress Notes (Signed)
[facility] LCSW Group Therapy      Emotional Regulation 1:15 - 2:30 PM         06/28/2012 3:36 PM  Type of Therapy:  Group Therapy  Participation Level:  Minimal  Participation Quality:  Appropriate  Affect:  Appropriate  Cognitive:  Appropriate  Insight:  Developing/Improving  Engagement in Therapy:  Developing/Improving  Modes of Intervention:  Education, Exploration, Problem-solving and Support  Summary of Progress/Problems:  Patient shared he had been dealing with the feeling of being overwhelmed due to cut-back in hours at work, losing his apartment and having legal problems.  [name] 06/28/2012, 3:36 PM
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
## 2012-06-28 NOTE — Tx Team (Signed)
Initial Interdisciplinary Treatment Plan  PATIENT STRENGTHS: (choose at least two) Capable of independent living Motivation for treatment/growth Supportive family/friends  PATIENT STRESSORS: Financial difficulties Marital or family conflict Substance abuse   PROBLEM LIST: Problem List/Patient Goals Date to be addressed Date deferred Reason deferred Estimated date of resolution  Suicide attempt 06/28/12     Depression 06/28/12     Alcohol Abuse 06/28/12                                          DISCHARGE CRITERIA:  Ability to meet basic life and health needs Adequate post-discharge living arrangements Improved stabilization in mood, thinking, and/or behavior Motivation to continue treatment in a less acute level of care Withdrawal symptoms are absent or subacute and managed without 24-hour nursing intervention  PRELIMINARY DISCHARGE PLAN: Attend PHP/IOP Attend 12-step recovery group Placement in alternative living arrangements Return to previous living arrangement Return to previous work or school arrangements  PATIENT/FAMIILY INVOLVEMENT: This treatment plan has been presented to and reviewed with the patient, [name], and/or family member.  The patient and family have been given the opportunity to ask questions and make suggestions.  Roselie Skinner [facility] 06/28/2012, 4:29 AM

## 2012-06-28 NOTE — Progress Notes (Signed)
D: Patient denies SI/HI and A/V hallucinations; patient reports that he requested medications for sleep; reports appetite to be good ; reports energy level is normal ; reports ability to pay attention is good; rates depression as 5/10; rates hopelessness 5/10; complaints of anxiety and tremors and nausea  A: Monitored q 15 minutes; patient encouraged to attend groups; patient educated about medications; patient given medications per physician orders; patient encouraged to express feelings and/or concerns; librium protocol;   R: Patient reports a decrease in nausea, anxiety, and tremors;  patient's interaction with staff and peers appropriate but minimal; patient is taking medications as prescribed and tolerating medications; patient is not attending all groups

## 2012-06-29 DIAGNOSIS — F339 Major depressive disorder, recurrent, unspecified: Secondary | ICD-10-CM | POA: Diagnosis present

## 2012-06-29 MED ORDER — SERTRALINE HCL 50 MG PO TABS
150.0000 mg | ORAL_TABLET | Freq: Every day | ORAL | Status: DC
  Administered 2012-06-30 – 2012-07-04 (×5): 150 mg via ORAL
  Filled 2012-06-29 (×7): qty 3

## 2012-06-29 MED ORDER — TRAZODONE HCL 50 MG PO TABS
50.0000 mg | ORAL_TABLET | Freq: Every day | ORAL | Status: DC
  Administered 2012-06-29 – 2012-06-30 (×2): 50 mg via ORAL
  Filled 2012-06-29 (×4): qty 1

## 2012-06-29 MED ORDER — IBUPROFEN 200 MG PO TABS
400.0000 mg | ORAL_TABLET | Freq: Four times a day (QID) | ORAL | Status: DC | PRN
  Administered 2012-06-29: 400 mg via ORAL
  Filled 2012-06-29: qty 2

## 2012-06-29 NOTE — Progress Notes (Signed)
Restring quietly with eyes closed. Respirations even and unlabored. No distress noted. Q 15 minute check continues as ordered to maintain safety.

## 2012-06-29 NOTE — Progress Notes (Signed)
Psychoeducational Group Note  Date:  06/29/2012 Time:  2000   Group Topic/Focus:  Karaoke  Participation Level:  Minimal  Participation Quality:  Supportive  Affect:  Flat  Cognitive:  Alert  Insight:  Improving  Engagement in Group:  Limited  Additional Comments:    Humberto Seals Monique 06/29/2012, 10:26 PM

## 2012-06-29 NOTE — Progress Notes (Signed)
Psychoeducational Group Note  Date:  06/29/2012 Time: 1000  Group Topic/Focus:  Overcoming Stress:   The focus of this group is to define stress and help patients assess their triggers.  Participation Level:  Active  Participation Quality:  Appropriate, Attentive and Sharing  Affect:  Appropriate  Cognitive:  Appropriate  Insight:  Developing/Improving  Engagement in Group:  Developing/Improving  Additional Comments: Donel participated in overcoming stress group. Patient defined stress in own terms. Patient completed a stress interview with a partner within the group. Discussed what stresses patient most, and if stress makes you angry or nervous and what happens when you feel stress out. Patient then completed managing stress ideas such as music, television, exercising, etc. Patient was appropriate and cooperative and shared during group.      Karleen Hampshire Brittini 06/29/2012, 1:29 PM

## 2012-06-29 NOTE — Progress Notes (Signed)
[facility] LCSW Aftercare Discharge Planning Group Note      8:30-9:30 AM   06/29/2012 3:56 PM  Participation Quality:  Appropriate  Affect:  Appropriate  Cognitive:  Appropriate  Insight:  Engaged  Engagement in Group:  Engaged  Modes of Intervention:  Exploration, Problem-solving and Support  Summary of Progress/Problems:  Patient advised that he was feeling better today.  He rated symptoms at seven and denied SI/HI.  Patient shared he is interested in residential treatment.  Referral to be made to [facility].  [name] 06/29/2012, 3:56 PM

## 2012-06-29 NOTE — Progress Notes (Signed)
Psychoeducational Group Note  Date:  06/29/2012 Time:  1100  Group Topic/Focus:  Wellness Toolbox:   The focus of this group is to discuss various aspects of wellness, balancing those aspects and exploring ways to increase the ability to experience wellness.  Patients will create a wellness toolbox for use upon discharge.  Participation Level:  Active  Participation Quality:  Appropriate, Sharing and Supportive  Affect:  Appropriate  Cognitive:  Alert and Appropriate  Insight:  Supportive  Engagement in Group:  Supportive  Additional Comments:  Productive group  Earline Mayotte 06/29/2012, 4:25 PM

## 2012-06-29 NOTE — Progress Notes (Signed)
[facility] MD Progress Note  06/29/2012 2:27 PM Gillian Kluever  MRN:  [id number]  Subjective:  "I am not sleeping. Remeron is not helping. I feel anxious. Why can't I have Ativan for my anxiety. I came to this hospital because of anxiety and depression. I am not suicidal, just very anxious".  Diagnosis:   Axis I: Major depressive disorder, recurrent episode Axis II: Deferred Axis III:  Past Medical History  Diagnosis Date  . Seizures   . Hypertension   . Depression   . Pancreatitis   . Liver cirrhosis   . Coronary artery disease   . Angina   . Shortness of breath   . Headache   . Peripheral vascular disease April 2011    Left Pop   Axis IV: other psychosocial or environmental problems Axis V: 41-50 serious symptoms  ADL's:  Intact  Sleep: Poor  Appetite:  Good  Suicidal Ideation: "No" Plan:  No Intent:  no Means:  no Homicidal Ideation: "No" Plan:  No Intent:  No Means:  no AEB (as evidenced by):  Psychiatric Specialty Exam: Review of Systems  Constitutional: Negative.   HENT: Negative.   Eyes: Negative.   Cardiovascular: Negative.   Gastrointestinal: Negative.   Genitourinary: Negative.   Musculoskeletal: Negative.   Skin: Negative.   Neurological: Negative.   Endo/Heme/Allergies: Negative.   Psychiatric/Behavioral: Positive for depression and substance abuse. Negative for suicidal ideas and hallucinations. The patient is nervous/anxious and has insomnia.     Blood pressure 101/66, pulse 89, temperature 98.1 F (36.7 C), temperature source Oral, resp. rate 16, height 5\' 6"  (1.676 m), weight 87.998 kg (194 lb).Body mass index is 31.31 kg/(m^2).  General Appearance: Casual  Eye Contact::  Good  Speech:  Clear and Coherent  Volume:  Normal  Mood:  Anxious  Affect:  Flat  Thought Process:  Coherent  Orientation:  Full (Time, Place, and Person)  Thought Content:  Hallucinations: None  Suicidal Thoughts:  No  Homicidal Thoughts:  No  Memory:  Immediate;    Good Recent;   Good Remote;   Good  Judgement:  Fair  Insight:  Fair  Psychomotor Activity:  Normal  Concentration:  Fair  Recall:  Good  Akathisia:  No  Handed:  Right  AIMS (if indicated):     Assets:  Desire for Improvement  Sleep:  Number of Hours: 6.5    Current Medications: Current Facility-Administered Medications  Medication Dose Route Frequency Provider Last Rate Last Dose  . alum & mag hydroxide-simeth (MAALOX/MYLANTA) 200-200-20 MG/5ML suspension 30 mL  30 mL Oral Q4H PRN Kerry Hough, PA   30 mL at 06/29/12 0703  . amLODipine (NORVASC) tablet 10 mg  10 mg Oral Daily Kerry Hough, PA   10 mg at 06/29/12 0758  . atorvastatin (LIPITOR) tablet 40 mg  40 mg Oral Daily Kerry Hough, PA   40 mg at 06/29/12 0758  . chlordiazePOXIDE (LIBRIUM) capsule 25 mg  25 mg Oral Q6H PRN Kerry Hough, PA      . [COMPLETED] chlordiazePOXIDE (LIBRIUM) capsule 25 mg  25 mg Oral QID Kerry Hough, PA   25 mg at 06/29/12 1204   Followed by  . chlordiazePOXIDE (LIBRIUM) capsule 25 mg  25 mg Oral TID Kerry Hough, PA       Followed by  . chlordiazePOXIDE (LIBRIUM) capsule 25 mg  25 mg Oral BH-qamhs Kerry Hough, PA       Followed by  . chlordiazePOXIDE (  LIBRIUM) capsule 25 mg  25 mg Oral Daily Kerry Hough, PA      . divalproex (DEPAKOTE ER) 24 hr tablet 500 mg  500 mg Oral BH-qamhs Kerry Hough, PA   500 mg at 06/29/12 0759  . hydrOXYzine (ATARAX/VISTARIL) tablet 25 mg  25 mg Oral Q6H PRN Kerry Hough, PA   25 mg at 06/28/12 0407  . ibuprofen (ADVIL,MOTRIN) tablet 400 mg  400 mg Oral Q6H PRN Kerry Hough, PA   400 mg at 06/29/12 1340  . loperamide (IMODIUM) capsule 2-4 mg  2-4 mg Oral PRN Kerry Hough, PA      . magnesium hydroxide (MILK OF MAGNESIA) suspension 30 mL  30 mL Oral Daily PRN Kerry Hough, PA      . metoprolol succinate (TOPROL-XL) 24 hr tablet 50 mg  50 mg Oral Daily Kerry Hough, PA   50 mg at 06/29/12 0759  . multivitamin with minerals  tablet 1 tablet  1 tablet Oral Daily Kerry Hough, PA   1 tablet at 06/29/12 0759  . nicotine (NICODERM CQ - dosed in mg/24 hours) patch 21 mg  21 mg Transdermal Q0600 Kerry Hough, PA   21 mg at 06/29/12 8295  . ondansetron (ZOFRAN-ODT) disintegrating tablet 4 mg  4 mg Oral Q6H PRN Kerry Hough, PA      . risperiDONE (RISPERDAL) tablet 0.5 mg  0.5 mg Oral BID Kerry Hough, PA   0.5 mg at 06/29/12 0800  . sertraline (ZOLOFT) tablet 150 mg  150 mg Oral Daily Sanjuana Kava, NP      . thiamine (VITAMIN B-1) tablet 100 mg  100 mg Oral Daily Kerry Hough, PA   100 mg at 06/29/12 0800  . traZODone (DESYREL) tablet 50 mg  50 mg Oral QHS Sanjuana Kava, NP      . [DISCONTINUED] acetaminophen (TYLENOL) tablet 650 mg  650 mg Oral Q6H PRN Kerry Hough, PA   650 mg at 06/28/12 2121  . [DISCONTINUED] mirtazapine (REMERON) tablet 30 mg  30 mg Oral QHS Kerry Hough, PA   30 mg at 06/28/12 2121  . [DISCONTINUED] sertraline (ZOLOFT) tablet 100 mg  100 mg Oral Daily Kerry Hough, PA   100 mg at 06/29/12 0800    Lab Results:  Results for orders placed during the hospital encounter of 06/28/12 (from the past 48 hour(s))  COMPREHENSIVE METABOLIC PANEL     Status: Abnormal   Collection Time   06/28/12  6:40 AM      Component Value Range Comment   Sodium 133 (*) 135 - 145 mEq/L    Potassium 3.5  3.5 - 5.1 mEq/L    Chloride 98  96 - 112 mEq/L    CO2 25  19 - 32 mEq/L    Glucose, Bld 89  70 - 99 mg/dL    BUN 21  6 - 23 mg/dL    Creatinine, Ser 6.21  0.50 - 1.35 mg/dL    Calcium 9.4  8.4 - 30.8 mg/dL    Total Protein 6.1  6.0 - 8.3 g/dL    Albumin 3.3 (*) 3.5 - 5.2 g/dL    AST 21  0 - 37 U/L    ALT 20  0 - 53 U/L    Alkaline Phosphatase 54  39 - 117 U/L    Total Bilirubin 0.3  0.3 - 1.2 mg/dL    GFR calc non Af Amer >90  >  90 mL/min    GFR calc Af Amer >90  >90 mL/min   MAGNESIUM     Status: Normal   Collection Time   06/28/12  6:40 AM      Component Value Range Comment   Magnesium  1.9  1.5 - 2.5 mg/dL   TSH     Status: Normal   Collection Time   06/28/12  6:40 AM      Component Value Range Comment   TSH 0.726  0.350 - 4.500 uIU/mL   VALPROIC ACID LEVEL     Status: Abnormal   Collection Time   06/28/12  6:40 AM      Component Value Range Comment   Valproic Acid Lvl 33.0 (*) 50.0 - 100.0 ug/mL     Physical Findings: AIMS: Facial and Oral Movements Muscles of Facial Expression: None, normal Lips and Perioral Area: None, normal Jaw: None, normal Tongue: None, normal,Extremity Movements Upper (arms, wrists, hands, fingers): None, normal Lower (legs, knees, ankles, toes): None, normal, Trunk Movements Neck, shoulders, hips: None, normal, Overall Severity Severity of abnormal movements (highest score from questions above): None, normal Incapacitation due to abnormal movements: None, normal [name] awareness of abnormal movements (rate only patient's report): No Awareness, Dental Status Current problems with teeth and/or dentures?: No Does patient usually wear dentures?: No  CIWA:  CIWA-Ar Total: 1  COWS:     Treatment Plan Summary: Daily contact with patient to assess and evaluate symptoms and progress in treatment Medication management  Plan: Discontinue Remeron 30 mg. Increase Sertraline from 100 mg to 150 mg daily. Trazodone 50 mg Q bedtime for sleep. Continue current treatment plan.  Medical Decision Making Problem Points:  Established problem, worsening (2) and Review of psycho-social stressors (1) Data Points:  Review or order medicine tests (1) Review of medication regiment & side effects (2) Review of new medications or change in dosage (2) Review or order of Psychological tests (1)  I certify that inpatient services furnished can reasonably be expected to improve the patient's condition.   Armandina Stammer I 06/29/2012, 2:27 PM

## 2012-06-29 NOTE — Progress Notes (Signed)
D:  [name] self inventory sheet, patient needs sleep medication, has improving appetite, normal energy level, good attention span.  Rated depression and hopelessness #5, anxiety #7.  Has experienced cravings.  Denied SI.  Denied physical problems.  Rated pain goal #5, worst pain #5.  Does plan to make changes after discharge, did not know specifics at this time.  No questions for staff.  No discharge plans.  No problems taking meds after discharge. A:  Medications administered per MD order.  Support and encouragement given throughout day.  Support and safety checks completed as ordered. R:  Following treatment plan.  Denied SI and HI.  Denied a/V hallucinations.  Contracts for safety.  Patient remains safe and receptive on unit. Has attended and participated in groups today.  Has been cooperative and pleasant.

## 2012-06-29 NOTE — Progress Notes (Signed)
[facility] LCSW Group Therapy       Living a Balanced Life  1:15-2:30              06/29/2012 3:29 PM  Type of Therapy:  Group Therapy  Participation Level:    Participation Quality:    Affect:    Cognitive:    Insight:    Engagement in Therapy:    Modes of Intervention:   Summary of Progress/Problems: Patient came to group but left early.  [name] 06/29/2012, 3:29 PM

## 2012-06-30 NOTE — Progress Notes (Signed)
D: Patient appropriate and cooperative with staff and peers. He reported on self inventory sheet that his appetite is improving, energy level is normal, and ability to pay attention is good. Patient rated depression and feelings of hopelessness "6".  A: Support and encouragement provided to patient. Scheduled medications administered per MD orders. Monitor Q15 minute checks for safety.  R: Patient receptive. Denies SI/HI/AVH. Patient remains safe.

## 2012-06-30 NOTE — Progress Notes (Signed)
D.  Pt. Cooperative and compliant with medications.  Denies Si/Hi and denies A/V hallucinations.  No concerns voiced at present. A.  Encouragement and support given. R.  Pt. Receptive and remains safe.

## 2012-06-30 NOTE — Progress Notes (Signed)
[facility] INPATIENT:  Family/Significant Other Suicide Prevention Education  Suicide Prevention Education:  Education Completed; [name], Mother,  [phone] has been identified by the patient as the family member/significant other with whom the patient will be residing, and identified as the person(s) who will aid the patient in the event of a mental health crisis (suicidal ideations/suicide attempt).  With written consent from the patient, the family member/significant other has been provided the following suicide prevention education, prior to the and/or following the discharge of the patient.  The suicide prevention education provided includes the following:  Suicide risk factors  Suicide prevention and interventions  National Suicide Hotline telephone number  [facility] assessment telephone number  [facility] Emergency Assistance 911  [facility] and/or Residential Mobile Crisis Unit telephone number  Request made of family/significant other to:  Remove weapons (e.g., guns, rifles, knives), all items previously/currently identified as safety concern.  Mother reports patient does not have a gun.  Remove drugs/medications (over-the-counter, prescriptions, illicit drugs), all items previously/currently identified as a safety concern.  The family member/significant other verbalizes understanding of the suicide prevention education information provided.  The family member/significant other agrees to remove the items of safety concern listed above.  Mother reports she has received information in the past and provided Clinical research associate with emergency telephone numbers.  [name] 06/30/2012, 12:43 PM

## 2012-06-30 NOTE — Progress Notes (Signed)
Patient ID: [name], male   DOB: Dec 08, 1980, 31 y.o.   MRN: [id number] D: Pt. In bed eyes closed, resp. Even. A: Pt. Will be monitored q36min for safety. Writer will monitor for s/s of WD and distress. R: pt. Is safe on the unit. No distress noted. Resp. Even, unlabored.

## 2012-06-30 NOTE — Progress Notes (Signed)
[facility] MD Progress Note  06/30/2012 2:24 PM [name]  MRN:  [id number] Subjective:  Patient seen. States having nightmares on the trazodone. Would like to go back on the Remeron. Diagnosis:   Axis I: Alcohol Abuse and Depressive Disorder NOS Axis II: No diagnosis Axis III:  Past Medical History  Diagnosis Date  . Seizures   . Hypertension   . Depression   . Pancreatitis   . Liver cirrhosis   . Coronary artery disease   . Angina   . Shortness of breath   . Headache   . Peripheral vascular disease April 2011    Left Pop   Axis IV: occupational problems and other psychosocial or environmental problems Axis V: 51-60 moderate symptoms  ADL's:  Intact  Sleep: Poor  Appetite:  Fair   Psychiatric Specialty Exam: Review of Systems  Constitutional: Negative.   HENT: Negative.   Eyes: Negative.   Respiratory: Negative.   Cardiovascular: Negative.   Gastrointestinal: Negative.   Genitourinary: Negative.   Musculoskeletal: Negative.   Skin: Negative.   Neurological: Negative.   Endo/Heme/Allergies: Negative.   Psychiatric/Behavioral: Positive for depression. The patient is nervous/anxious.     Blood pressure 115/72, pulse 101, temperature 98.2 F (36.8 C), temperature source Oral, resp. rate 16, height 5\' 6"  (1.676 m), weight 87.998 kg (194 lb).Body mass index is 31.31 kg/(m^2).  General Appearance: Casual  Eye Contact::  Minimal  Speech:  Clear and Coherent  Volume:  Normal  Mood:  Anxious and Depressed  Affect:  Depressed  Thought Process:  Coherent  Orientation:  Full (Time, Place, and Person)  Thought Content:  WDL  Suicidal Thoughts:  No  Homicidal Thoughts:  No  Memory:  Immediate;   Fair Recent;   Fair Remote;   Fair  Judgement:  Intact  Insight:  Present  Psychomotor Activity:  Normal  Concentration:  Fair  Recall:  Fair  Akathisia:  No  Handed:  Right  AIMS (if indicated):     Assets:  Communication Skills Desire for Improvement  Sleep:  Number of  Hours: 6.25    Current Medications: Current Facility-Administered Medications  Medication Dose Route Frequency Provider Last Rate Last Dose  . alum & mag hydroxide-simeth (MAALOX/MYLANTA) 200-200-20 MG/5ML suspension 30 mL  30 mL Oral Q4H PRN Kerry Hough, PA   30 mL at 06/29/12 0703  . amLODipine (NORVASC) tablet 10 mg  10 mg Oral Daily Kerry Hough, PA   10 mg at 06/30/12 0934  . atorvastatin (LIPITOR) tablet 40 mg  40 mg Oral Daily Kerry Hough, PA   40 mg at 06/30/12 0934  . chlordiazePOXIDE (LIBRIUM) capsule 25 mg  25 mg Oral Q6H PRN Kerry Hough, PA      . chlordiazePOXIDE (LIBRIUM) capsule 25 mg  25 mg Oral BH-qamhs Kerry Hough, PA       Followed by  . chlordiazePOXIDE (LIBRIUM) capsule 25 mg  25 mg Oral Daily Kerry Hough, PA      . divalproex (DEPAKOTE ER) 24 hr tablet 500 mg  500 mg Oral BH-qamhs Kerry Hough, PA   500 mg at 06/30/12 0934  . hydrOXYzine (ATARAX/VISTARIL) tablet 25 mg  25 mg Oral Q6H PRN Kerry Hough, PA   25 mg at 06/29/12 2134  . ibuprofen (ADVIL,MOTRIN) tablet 400 mg  400 mg Oral Q6H PRN Kerry Hough, PA   400 mg at 06/29/12 1340  . loperamide (IMODIUM) capsule 2-4 mg  2-4 mg Oral  PRN Kerry Hough, PA      . magnesium hydroxide (MILK OF MAGNESIA) suspension 30 mL  30 mL Oral Daily PRN Kerry Hough, PA      . metoprolol succinate (TOPROL-XL) 24 hr tablet 50 mg  50 mg Oral Daily Kerry Hough, PA   50 mg at 06/30/12 0932  . multivitamin with minerals tablet 1 tablet  1 tablet Oral Daily Kerry Hough, PA   1 tablet at 06/30/12 0932  . nicotine (NICODERM CQ - dosed in mg/24 hours) patch 21 mg  21 mg Transdermal Q0600 Kerry Hough, PA   21 mg at 06/30/12 0649  . ondansetron (ZOFRAN-ODT) disintegrating tablet 4 mg  4 mg Oral Q6H PRN Kerry Hough, PA      . risperiDONE (RISPERDAL) tablet 0.5 mg  0.5 mg Oral BID Kerry Hough, PA   0.5 mg at 06/30/12 0935  . sertraline (ZOLOFT) tablet 150 mg  150 mg Oral Daily Sanjuana Kava, NP    150 mg at 06/30/12 0933  . thiamine (VITAMIN B-1) tablet 100 mg  100 mg Oral Daily Kerry Hough, PA   100 mg at 06/30/12 0935  . traZODone (DESYREL) tablet 50 mg  50 mg Oral QHS Sanjuana Kava, NP   50 mg at 06/29/12 2134    Lab Results: No results found for this or any previous visit (from the past 48 hour(s)).  Physical Findings: AIMS: Facial and Oral Movements Muscles of Facial Expression:  (pt. in bed, eyes closed no abnormal movements noted.) Lips and Perioral Area: None, normal Jaw: None, normal Tongue: None, normal,Extremity Movements Upper (arms, wrists, hands, fingers): None, normal Lower (legs, knees, ankles, toes): None, normal, Trunk Movements Neck, shoulders, hips: None, normal, Overall Severity Severity of abnormal movements (highest score from questions above): None, normal Incapacitation due to abnormal movements: None, normal [name] awareness of abnormal movements (rate only patient's report): No Awareness, Dental Status Current problems with teeth and/or dentures?: No Does patient usually wear dentures?: No  CIWA:  CIWA-Ar Total: 0  COWS:  COWS Total Score: 0   Treatment Plan Summary: Daily contact with patient to assess and evaluate symptoms and progress in treatment Medication management  Plan: Discontinue trazodone. Start remeron at 45mg  po qd to address mood symptoms. Encouraged to attend groups.  Medical Decision Making Problem Points:  Established problem, stable/improving (1), New problem, with no additional work-up planned (3), Review of last therapy session (1) and Review of psycho-social stressors (1) Data Points:  Review of medication regiment & side effects (2) Review of new medications or change in dosage (2)  I certify that inpatient services furnished can reasonably be expected to improve the patient's condition.   [name] 06/30/2012, 2:24 PM

## 2012-07-01 DIAGNOSIS — F1010 Alcohol abuse, uncomplicated: Secondary | ICD-10-CM

## 2012-07-01 DIAGNOSIS — F329 Major depressive disorder, single episode, unspecified: Secondary | ICD-10-CM

## 2012-07-01 MED ORDER — HYDROXYZINE HCL 50 MG PO TABS
50.0000 mg | ORAL_TABLET | Freq: Four times a day (QID) | ORAL | Status: DC | PRN
  Administered 2012-07-01 – 2012-07-04 (×6): 50 mg via ORAL
  Filled 2012-07-01: qty 2
  Filled 2012-07-01: qty 40

## 2012-07-01 MED ORDER — MIRTAZAPINE 15 MG PO TABS
15.0000 mg | ORAL_TABLET | Freq: Every day | ORAL | Status: DC
  Administered 2012-07-01: 15 mg via ORAL
  Filled 2012-07-01 (×4): qty 1

## 2012-07-01 NOTE — Progress Notes (Signed)
D: Pt in bed resting with eyes closed. Respirations even and unlabored. Pt appears to be in no signs of distress at this time. A: Q15min checks remains for this pt. R: Pt remains safe at this time.   

## 2012-07-01 NOTE — Progress Notes (Signed)
[facility] Group Notes:  (Counselor/Nursing/MHT/Case Management/Adjunct)  07/01/2012 10:21 PM  Type of Therapy:  Psychoeducational Skills  Participation Level:  Active  Participation Quality:  Attentive  Affect:  Flat  Cognitive:  Appropriate  Insight:  Developing/Improving  Engagement in Group:  Supportive  Engagement in Therapy:  Supportive  Modes of Intervention:  Education  Summary of Progress/Problems: The patient described his day as being "good" overall. His affect brightened considerably when he mentioned that his family brought in a clean set of clothing for him. He also noted that he was able to socialize with his peers. His goal for tomorrow is to speak with his doctor regarding his medications.    [name] 07/01/2012, 10:21 PM

## 2012-07-01 NOTE — Clinical Social Work Note (Signed)
[facility] Group Notes:  (Clinical Social Work)  07/01/2012   3:00-4:00PM  Summary of Progress/Problems:   The main focus of today's process group was for the patient to identify ways in which they have in the past sabotaged their own recovery and reasons they may have done this/what they received from doing it.  We then worked to identify a specific plan to avoid doing this when discharged from the hospital for this admission.  The patient came in toward the end of the group, did not share but did listen.  Type of Therapy:  Group Therapy - Process  Participation Level:  Minimal  Participation Quality:  Attentive  Affect:  Blunted  Cognitive:  Oriented  Insight:  missed most of group  Engagement in Therapy:  missed most of group  Modes of Intervention:  Clarification, Education, Limit-setting, Problem-solving, Socialization, Support and Processing, Exploration, Discussion   Ambrose Mantle, LCSW 07/01/2012, 4:56 PM

## 2012-07-01 NOTE — Progress Notes (Signed)
Goals group: Goal today is to participate more in groups

## 2012-07-01 NOTE — Progress Notes (Signed)
[facility] Group Notes:  (Counselor/Nursing/MHT/Case Management/Adjunct)  07/01/2012 12:05 AM  Type of Therapy:  Group Therapy  Participation Level:  Minimal  Participation Quality:  Appropriate  Affect:  Appropriate  Cognitive:  Appropriate  Insight:  Engaged  Engagement in Group:  Engaged  Engagement in Therapy:  Engaged  Modes of Intervention:  Problem-solving and Support  Summary of Progress/Problems: Pt. Stated he was feeling better, because he feels the medicine is working.   Sondra Come 07/01/2012, 12:05 AM

## 2012-07-01 NOTE — Progress Notes (Signed)
D) Pt attended on e of the groups this morning and went to lunch, otherwise has been in his bed sleeping. Denies SI and HI. Rates his depression and hopelessness both at a 5. Pt tends to sleep a lot while he is here on the unit. A) Given support to come out in the milieu, without success. Encouraged to work on his paperwork. R) Denies SI and HI.

## 2012-07-01 NOTE — Progress Notes (Signed)
Patient ID: [name], male   DOB: 1981/01/08, 31 y.o.   MRN: [id number] [facility] MD Progress Note  07/02/2012 3:37 AM [name]  MRN:  [id number]  Subjective:  "I feel okay. I'm just tired. Can you check my Risperdal dosage"  Diagnosis:   Axis I: Alcohol Abuse and Depressive Disorder NOS Axis II: No diagnosis Axis III:  Past Medical History  Diagnosis Date  . Seizures   . Hypertension   . Depression   . Pancreatitis   . Liver cirrhosis   . Coronary artery disease   . Angina   . Shortness of breath   . Headache   . Peripheral vascular disease April 2011    Left Pop   Axis IV: occupational problems and other psychosocial or environmental problems Axis V: 51-60 moderate symptoms  ADL's:  Intact  Sleep: Good  Appetite:  Fair   Psychiatric Specialty Exam: Review of Systems  Constitutional: Negative.   HENT: Negative.   Eyes: Negative.   Respiratory: Negative.   Cardiovascular: Negative.   Gastrointestinal: Negative.   Genitourinary: Negative.   Musculoskeletal: Negative.   Skin: Negative.   Neurological: Negative.   Endo/Heme/Allergies: Negative.   Psychiatric/Behavioral: Positive for depression (currently being stabilized with medication.). The patient is nervous/anxious (Currently being stabilized with medication.).     Blood pressure 120/81, pulse 86, temperature 98.1 F (36.7 C), temperature source Oral, resp. rate 16, height 5\' 6"  (1.676 m), weight 87.998 kg (194 lb).Body mass index is 31.31 kg/(m^2).  General Appearance: Casual  Eye Contact::  Minimal  Speech:  Clear and Coherent  Volume:  Normal  Mood:  Anxious and Depressed  Affect:  Depressed  Thought Process:  Coherent  Orientation:  Full (Time, Place, and Person)  Thought Content:  WDL  Suicidal Thoughts:  No  Homicidal Thoughts:  No  Memory:  Immediate;   Fair Recent;   Fair Remote;   Fair  Judgement:  Intact  Insight:  Present  Psychomotor Activity:  Normal  Concentration:  Fair  Recall:   Fair  Akathisia:  No  Handed:  Right  AIMS (if indicated):     Assets:  Communication Skills Desire for Improvement  Sleep:  Number of Hours: 6.75    Current Medications: Current Facility-Administered Medications  Medication Dose Route Frequency Provider Last Rate Last Dose  . alum & mag hydroxide-simeth (MAALOX/MYLANTA) 200-200-20 MG/5ML suspension 30 mL  30 mL Oral Q4H PRN Kerry Hough, PA   30 mL at 06/29/12 0703  . amLODipine (NORVASC) tablet 10 mg  10 mg Oral Daily Kerry Hough, PA   10 mg at 07/01/12 0835  . atorvastatin (LIPITOR) tablet 40 mg  40 mg Oral Daily Kerry Hough, PA   40 mg at 07/01/12 0835  . chlordiazePOXIDE (LIBRIUM) capsule 25 mg  25 mg Oral Daily Kerry Hough, PA      . divalproex (DEPAKOTE ER) 24 hr tablet 500 mg  500 mg Oral BH-qamhs Kerry Hough, PA   500 mg at 07/01/12 2135  . hydrOXYzine (ATARAX/VISTARIL) tablet 50 mg  50 mg Oral Q6H PRN Wonda Cerise, MD   50 mg at 07/01/12 2006  . ibuprofen (ADVIL,MOTRIN) tablet 400 mg  400 mg Oral Q6H PRN Kerry Hough, PA   400 mg at 06/29/12 1340  . magnesium hydroxide (MILK OF MAGNESIA) suspension 30 mL  30 mL Oral Daily PRN Kerry Hough, PA      . metoprolol succinate (TOPROL-XL) 24 hr tablet 50 mg  50 mg Oral Daily Kerry Hough, PA   50 mg at 07/01/12 4098  . mirtazapine (REMERON) tablet 15 mg  15 mg Oral QHS Wonda Cerise, MD   15 mg at 07/01/12 2135  . multivitamin with minerals tablet 1 tablet  1 tablet Oral Daily Kerry Hough, PA   1 tablet at 07/01/12 0835  . nicotine (NICODERM CQ - dosed in mg/24 hours) patch 21 mg  21 mg Transdermal Q0600 Kerry Hough, PA   21 mg at 07/01/12 1191  . risperiDONE (RISPERDAL) tablet 0.5 mg  0.5 mg Oral BID Kerry Hough, PA   0.5 mg at 07/01/12 1657  . sertraline (ZOLOFT) tablet 150 mg  150 mg Oral Daily Sanjuana Kava, NP   150 mg at 07/01/12 0835  . thiamine (VITAMIN B-1) tablet 100 mg  100 mg Oral Daily Kerry Hough, PA   100 mg at 07/01/12 4782    Lab  Results: No results found for this or any previous visit (from the past 48 hour(s)).  Physical Findings: AIMS: Facial and Oral Movements Muscles of Facial Expression: None, normal Lips and Perioral Area: None, normal Jaw: None, normal Tongue: None, normal,Extremity Movements Upper (arms, wrists, hands, fingers): None, normal Lower (legs, knees, ankles, toes): None, normal, Trunk Movements Neck, shoulders, hips: None, normal, Overall Severity Severity of abnormal movements (highest score from questions above): None, normal Incapacitation due to abnormal movements: None, normal [name] awareness of abnormal movements (rate only patient's report): No Awareness, Dental Status Current problems with teeth and/or dentures?: No Does patient usually wear dentures?: No  CIWA:  CIWA-Ar Total: 0  COWS:  COWS Total Score: 0   Treatment Plan Summary: Daily contact with patient to assess and evaluate symptoms and progress in treatment Medication management  Plan: See new changes made on the current treatment regimen. Increased Risperdal to 1 mg bid. Continue current treatment plan. Encouraged to attend groups.  Medical Decision Making Problem Points:  Established problem, stable/improving (1), New problem, with no additional work-up planned (3), Review of last therapy session (1) and Review of psycho-social stressors (1) Data Points:  Review of medication regiment & side effects (2) Review of new medications or change in dosage (2)  I certify that inpatient services furnished can reasonably be expected to improve the patient's condition.   Armandina Stammer I 07/02/2012, 3:37 AM

## 2012-07-01 NOTE — Progress Notes (Signed)
Psychoeducational Group Note  Date:  12/14//2013 Time: 1015  Group Topic/Focus:  Identifying Needs:   The focus of this group is to help patients identify their personal needs that have been historically problematic and identify healthy behaviors to address their needs.  Participation Level:  Active  Participation Quality: good  Affect: flat  Cognitive: fair   Insight:  good  Engagement in Group: engaged  Additional Comments:  PDuke RN [facility] 07/01/2012

## 2012-07-02 MED ORDER — RISPERIDONE 1 MG PO TABS
1.0000 mg | ORAL_TABLET | Freq: Two times a day (BID) | ORAL | Status: DC
Start: 2012-07-02 — End: 2012-07-02
  Administered 2012-07-02: 1 mg via ORAL
  Filled 2012-07-02 (×6): qty 1

## 2012-07-02 MED ORDER — RISPERIDONE 3 MG PO TABS
3.0000 mg | ORAL_TABLET | Freq: Every day | ORAL | Status: DC
  Administered 2012-07-03: 3 mg via ORAL
  Filled 2012-07-02 (×3): qty 1

## 2012-07-02 MED ORDER — MIRTAZAPINE 30 MG PO TABS
30.0000 mg | ORAL_TABLET | Freq: Every day | ORAL | Status: DC
  Administered 2012-07-02 – 2012-07-03 (×2): 30 mg via ORAL
  Filled 2012-07-02 (×5): qty 1

## 2012-07-02 NOTE — Progress Notes (Signed)
Psychoeducational Group Note  Date: 07/02/2012 Time: 1015  Group Topic/Focus:  Making Healthy Choices:   The focus of this group is to help patients identify negative/unhealthy choices they were using prior to admission and identify positive/healthier coping strategies to replace them upon discharge.  Participation Level:  Active  Participation Quality:  Appropriate  Affect:  Appropriate  Cognitive:  Appropriate  Insight:  Improving  Engagement in Group:  Improving  Additional Comments:    07/02/2012,1:52 PM [name]   

## 2012-07-02 NOTE — Progress Notes (Signed)
[facility] MD Progress Note  07/02/2012 1:35 PM [name]  MRN:  [id number] Subjective:  "My risperdal isn't working, my remeron isn't working, and please increase my vistaril." Diagnosis:  Major depressive disorder, recurrent episode  ADL's:  Intact  Sleep: Poor  Appetite:  Fair  Suicidal Ideation:  denies Homicidal Ideation:  denies AEB (as evidenced by): patient's verbal response, affect, and written response on daily self inventory.  Psychiatric Specialty Exam: Review of Systems  Constitutional: Negative.  Negative for fever, chills, weight loss, malaise/fatigue and diaphoresis.  HENT: Negative for congestion and sore throat.   Eyes: Negative for blurred vision, double vision and photophobia.  Respiratory: Negative for cough, shortness of breath and wheezing.   Cardiovascular: Negative for chest pain, palpitations and PND.  Gastrointestinal: Negative for heartburn, nausea, vomiting, abdominal pain, diarrhea and constipation.  Musculoskeletal: Negative for myalgias, joint pain and falls.  Neurological: Negative for dizziness, tingling, tremors, sensory change, speech change, focal weakness, seizures, loss of consciousness, weakness and headaches.  Endo/Heme/Allergies: Negative for polydipsia. Does not bruise/bleed easily.  Psychiatric/Behavioral: Negative for depression, suicidal ideas, hallucinations, memory loss and substance abuse. The patient is not nervous/anxious and does not have insomnia.     Blood pressure 123/76, pulse 86, temperature 98.6 F (37 C), temperature source Oral, resp. rate 16, height 5\' 6"  (1.676 m), weight 87.998 kg (194 lb).Body mass index is 31.31 kg/(m^2).  General Appearance: Casual  Eye Contact::  Good  Speech:  Clear and Coherent  Volume:  Normal  Mood:  Depressed  8/10  Affect:  Congruent  Thought Process:  Coherent  Orientation:  Full (Time, Place, and Person)  Thought Content:  Hallucinations: Auditory  Suicidal Thoughts:  No  Homicidal  Thoughts:  No  Memory:  Immediate;   Fair  Judgement:  Intact  Insight:  Present  Psychomotor Activity:  Normal  Concentration:  Fair  Recall:  Fair  Akathisia:  No  Handed:  Right  AIMS (if indicated):     Assets:  Communication Skills Desire for Improvement  Sleep:  Number of Hours: 6.5    Current Medications: Current Facility-Administered Medications  Medication Dose Route Frequency Provider Last Rate Last Dose  . alum & mag hydroxide-simeth (MAALOX/MYLANTA) 200-200-20 MG/5ML suspension 30 mL  30 mL Oral Q4H PRN Kerry Hough, PA   30 mL at 06/29/12 0703  . amLODipine (NORVASC) tablet 10 mg  10 mg Oral Daily Kerry Hough, PA   10 mg at 07/02/12 1201  . atorvastatin (LIPITOR) tablet 40 mg  40 mg Oral Daily Kerry Hough, PA   40 mg at 07/02/12 1201  . divalproex (DEPAKOTE ER) 24 hr tablet 500 mg  500 mg Oral BH-qamhs Kerry Hough, PA   500 mg at 07/02/12 1201  . hydrOXYzine (ATARAX/VISTARIL) tablet 50 mg  50 mg Oral Q6H PRN Wonda Cerise, MD   50 mg at 07/02/12 1206  . ibuprofen (ADVIL,MOTRIN) tablet 400 mg  400 mg Oral Q6H PRN Kerry Hough, PA   400 mg at 06/29/12 1340  . magnesium hydroxide (MILK OF MAGNESIA) suspension 30 mL  30 mL Oral Daily PRN Kerry Hough, PA      . metoprolol succinate (TOPROL-XL) 24 hr tablet 50 mg  50 mg Oral Daily Kerry Hough, PA   50 mg at 07/02/12 1201  . mirtazapine (REMERON) tablet 30 mg  30 mg Oral QHS Verne Spurr, PA-C      . multivitamin with minerals tablet 1 tablet  1  tablet Oral Daily Kerry Hough, PA   1 tablet at 07/02/12 1201  . nicotine (NICODERM CQ - dosed in mg/24 hours) patch 21 mg  21 mg Transdermal Q0600 Kerry Hough, PA   21 mg at 07/02/12 1203  . risperiDONE (RISPERDAL) tablet 3 mg  3 mg Oral QHS Verne Spurr, PA-C      . sertraline (ZOLOFT) tablet 150 mg  150 mg Oral Daily Sanjuana Kava, NP   150 mg at 07/02/12 1202  . thiamine (VITAMIN B-1) tablet 100 mg  100 mg Oral Daily Kerry Hough, PA   100 mg at  07/02/12 1202    Lab Results: No results found for this or any previous visit (from the past 48 hour(s)).  Physical Findings: AIMS: Facial and Oral Movements Muscles of Facial Expression: None, normal Lips and Perioral Area: None, normal Jaw: None, normal Tongue: None, normal,Extremity Movements Upper (arms, wrists, hands, fingers): None, normal Lower (legs, knees, ankles, toes): None, normal, Trunk Movements Neck, shoulders, hips: None, normal, Overall Severity Severity of abnormal movements (highest score from questions above): None, normal Incapacitation due to abnormal movements: None, normal [name] awareness of abnormal movements (rate only patient's report): No Awareness, Dental Status Current problems with teeth and/or dentures?: No Does patient usually wear dentures?: No  CIWA:  CIWA-Ar Total: 0  COWS:  COWS Total Score: 0   Treatment Plan Summary: Daily contact with patient to assess and evaluate symptoms and progress in treatment Medication management  Plan: 1. Increased the Risperdal to 3mg  at HS. 2. Increased the Remeron to 30mg  at hs. 3. Reviewed the Librium protocol with him. 4. Continue the above plan as written. Medical Decision Making Problem Points:  Established problem, stable/improving (1) Data Points:  Review or order clinical lab tests (1) Review of new medications or change in dosage (2)  I certify that inpatient services furnished can reasonably be expected to improve the patient's condition.  Rona Ravens. Brittinie Wherley PAC 07/02/2012, 1:35 PM

## 2012-07-02 NOTE — Clinical Social Work Note (Signed)
[facility] Group Notes:  (Clinical Social Work)  07/02/2012   3:00-4:00PM  Summary of Progress/Problems:   Summary of Progress/Problems:   The main focus of today's process group was for the patient to define "support" and describe what healthy supports are, then to identify the patient's current support system and decide on other supports that can be put in place to prevent future hospitalizations.  Roleplay was used to demonstrate definitions of different types of available supports.  An emphasis was placed on using therapist, doctor, therapy groups, self-help groups and problem-specific support groups to expand supports. Additionally, psychoeducation on various symptoms of mental illness was done at patients' requests.  The patient expressed that he can improve his support system, and gave thoughtful responses to several questions; however, he speaks so quietly it is very hard to hear his contribution.  He shared openly about his own hallucinations, and how the voices never say good things.  Type of Therapy:  Process Group  Participation Level:  Active  Participation Quality:  Attentive and Sharing  Affect:  Depressed and Flat  Cognitive:  Appropriate and Oriented  Insight:  Engaged  Engagement in Therapy:  Engaged  Modes of Intervention:  Clarification, Education, Limit-setting, Problem-solving, Socialization, Support and Processing, Exploration, Discussion, Role-Play   Ambrose Mantle, LCSW 07/02/2012, 4:39 PM

## 2012-07-02 NOTE — Progress Notes (Signed)
D: Pt spent majority of shift in room; isolative with minimal interaction, although did attend wrapup group.  Stated today was a good day "because my parents got me some clothes" and "I stay in my room but I feel at ease".  Approached med room at beginning of shift to request medication for severe anxiety; none was available.  Also requested change from Trazodone to Remeron that was reportedly to occur yesterday but had not yet been effected.  During limited interaction, eye contact fair with sad facial expression.  Affect and mood depressed.  Speech is logical and coherent but verbal interaction, although straightforward, remains minimal.  No overt signs of disorganized thought process or content.  Denies SI/HI, AVH, and acute pain.  Contracting for safety on unit.  A: On-call coverage contacted for anxiety medication (Vistaril 50mg  PO Q6 hours PRN); Remeron order obtained as well (15mg  PO QHS).  Pt given Vistaril 50 mg PO PRN @ 2006 with some relief.  All medications administered according to med orders and POC.  Q15 minute safety checks maintained as per unit policy.  Pt offered support and 1:1 time but remained secluded despite encouragement to come out of room.  R: Pt satisfied with Vistaril order; would like Remeron increased (takes 60mg  at home) but was pleased to get order changed from Trazodone.  Cooperative and pleasant but presence on milieu was minimal.  Safety maintained. Dion Saucier RN

## 2012-07-02 NOTE — Progress Notes (Signed)
[name] is seen out in the milieu today..he is acutely depressed. HE did not get up for his AM meds until after 10:00 today. He makes poor eye contact. HE is withdrawn. HE avoids keeping and maintaining eye contact.  A HE did complete his AM self inventory and on it he wrote he denied SI, he rated his depression and hopelessness "5/5" and he stated his DC plan includes  To : : take my medications".  R Safety is in place and POC includes continuing to foster therapeutic relationship .

## 2012-07-03 NOTE — Progress Notes (Signed)
Patient ID: [name], male   DOB: 10/15/1980, 31 y.o.   MRN: [id number] Patient ID: Antario Yasuda, male   DOB: [birth year], 31 y.o.   MRN: [id number] [facility] MD Progress Note  07/03/2012 3:48 PM Milad Bublitz  MRN:  [id number]  Subjective:  "I have headaches this morning. But I have taken some medicine for it. It is relieving gradually. My mood is good.  I believe that I am on right medicines and it helping me.  I think that I'm ready to go home"  Diagnosis:   Axis I: Alcohol Abuse and Depressive Disorder NOS Axis II: No diagnosis Axis III:  Past Medical History  Diagnosis Date  . Seizures   . Hypertension   . Depression   . Pancreatitis   . Liver cirrhosis   . Coronary artery disease   . Angina   . Shortness of breath   . Headache   . Peripheral vascular disease April 2011    Left Pop   Axis IV: occupational problems and other psychosocial or environmental problems Axis V: 51-60 moderate symptoms  ADL's:  Intact  Sleep: Good  Appetite:  Fair   Psychiatric Specialty Exam: Review of Systems  Constitutional: Negative.   HENT: Negative.   Eyes: Negative.   Respiratory: Negative.   Cardiovascular: Negative.   Gastrointestinal: Negative.   Genitourinary: Negative.   Musculoskeletal: Negative.   Skin: Negative.   Neurological: Negative.   Endo/Heme/Allergies: Negative.   Psychiatric/Behavioral: Positive for depression (currently being stabilized with medication.). The patient is nervous/anxious (Currently being stabilized with medication.).     Blood pressure 112/70, pulse 84, temperature 97 F (36.1 C), temperature source Oral, resp. rate 16, height 5\' 6"  (1.676 m), weight 87.998 kg (194 lb).Body mass index is 31.31 kg/(m^2).  General Appearance: Casual  Eye Contact::  Minimal  Speech:  Clear and Coherent  Volume:  Normal  Mood:  "I feel very well"  Affect:  Euthymic  Thought Process:  Coherent  Orientation:  Full (Time, Place, and Person)  Thought Content:  WDL   Suicidal Thoughts:  No  Homicidal Thoughts:  No  Memory:  Immediate;   Fair Recent;   Fair Remote;   Fair  Judgement:  Intact  Insight:  Present  Psychomotor Activity:  Normal  Concentration:  Fair  Recall:  Fair  Akathisia:  No  Handed:  Right  AIMS (if indicated):     Assets:  Communication Skills Desire for Improvement  Sleep:  Number of Hours: 6.75    Current Medications: Current Facility-Administered Medications  Medication Dose Route Frequency Provider Last Rate Last Dose  . alum & mag hydroxide-simeth (MAALOX/MYLANTA) 200-200-20 MG/5ML suspension 30 mL  30 mL Oral Q4H PRN Kerry Hough, PA   30 mL at 06/29/12 0703  . amLODipine (NORVASC) tablet 10 mg  10 mg Oral Daily Kerry Hough, PA   10 mg at 07/03/12 0743  . atorvastatin (LIPITOR) tablet 40 mg  40 mg Oral Daily Kerry Hough, PA   40 mg at 07/03/12 0743  . divalproex (DEPAKOTE ER) 24 hr tablet 500 mg  500 mg Oral BH-qamhs Kerry Hough, PA   500 mg at 07/03/12 0743  . hydrOXYzine (ATARAX/VISTARIL) tablet 50 mg  50 mg Oral Q6H PRN Wonda Cerise, MD   50 mg at 07/03/12 1305  . ibuprofen (ADVIL,MOTRIN) tablet 400 mg  400 mg Oral Q6H PRN Kerry Hough, PA   400 mg at 06/29/12 1340  . magnesium hydroxide (MILK OF MAGNESIA)  suspension 30 mL  30 mL Oral Daily PRN Kerry Hough, PA      . metoprolol succinate (TOPROL-XL) 24 hr tablet 50 mg  50 mg Oral Daily Kerry Hough, PA   50 mg at 07/03/12 0743  . mirtazapine (REMERON) tablet 30 mg  30 mg Oral QHS Verne Spurr, PA-C   30 mg at 07/02/12 2114  . multivitamin with minerals tablet 1 tablet  1 tablet Oral Daily Kerry Hough, PA   1 tablet at 07/03/12 0743  . nicotine (NICODERM CQ - dosed in mg/24 hours) patch 21 mg  21 mg Transdermal Q0600 Kerry Hough, PA   21 mg at 07/03/12 0744  . risperiDONE (RISPERDAL) tablet 3 mg  3 mg Oral QHS Verne Spurr, PA-C      . sertraline (ZOLOFT) tablet 150 mg  150 mg Oral Daily Sanjuana Kava, NP   150 mg at 07/03/12 0743  .  thiamine (VITAMIN B-1) tablet 100 mg  100 mg Oral Daily Kerry Hough, PA   100 mg at 07/03/12 1610    Lab Results: No results found for this or any previous visit (from the past 48 hour(s)).  Physical Findings: AIMS: Facial and Oral Movements Muscles of Facial Expression: None, normal Lips and Perioral Area: None, normal Jaw: None, normal Tongue: None, normal,Extremity Movements Upper (arms, wrists, hands, fingers): None, normal Lower (legs, knees, ankles, toes): None, normal, Trunk Movements Neck, shoulders, hips: None, normal, Overall Severity Severity of abnormal movements (highest score from questions above): None, normal Incapacitation due to abnormal movements: None, normal [name] awareness of abnormal movements (rate only patient's report): No Awareness, Dental Status Current problems with teeth and/or dentures?: No Does patient usually wear dentures?: No  CIWA:  CIWA-Ar Total: 0  COWS:  COWS Total Score: 0   Treatment Plan Summary: Daily contact with patient to assess and evaluate symptoms and progress in treatment Medication management  Plan: No new changes made on the current treatment plan. Continue current treatment plan.  Medical Decision Making Problem Points:  Established problem, stable/improving (1), New problem, with no additional work-up planned (3), Review of last therapy session (1) and Review of psycho-social stressors (1) Data Points:  Review of medication regiment & side effects (2) Review of new medications or change in dosage (2)  I certify that inpatient services furnished can reasonably be expected to improve the patient's condition.   Armandina Stammer I 07/03/2012, 3:48 PM

## 2012-07-03 NOTE — Tx Team (Signed)
Interdisciplinary Treatment Plan Update (Adult)  Date:  07/03/2012  Time Reviewed:  10:02 AM   Progress in Treatment: Attending groups:   Yes   Participating in groups:  Yes Taking medication as prescribed:  Yes Tolerating medication:  Yes Family/Significant othe contact made: Contact made with family Patient understands diagnosis:  Yes Discussing patient identified problems/goals with staff: Yes Medical problems stabilized or resolved: Yes Denies suicidal/homicidal ideation:Yes Issues/concerns per patient self-inventory:  Other:   New problem(s) identified:  Reason for Continuation of Hospitalization: Anxiety Depression Medication stabilization   Interventions implemented related to continuation of hospitalization:  Medication Management; safety checks q 15 mins  Additional comments:  Estimated length of stay: 1 days  Discharge Plan:  Home with outpatient follow up  New goal(s):  Review of initial/current patient goals per problem list:    1.  Goal(s): Eliminate SI/other thoughts of self harm   Met:  Yes  Target date: d/c  As evidenced by: Patient no longer endorsing SI/HI or other thoughts of self harm.    2.  Goal (s):Reduce depression/anxiety (symptoms rated at one today)  Met: Yes  Target date: d/c  As evidenced by: Patient currently rating symptoms at four or below    3.  Goal(s):.stabilize on meds   Met:  Yes  Target date: d/c  As evidenced by: Patient reports being stabilized on medications - less symptomatic    4.  Goal(s): Refer for outpatient follow up   Met:  Yes  Target date: d/c  As evidenced by: Follow up appointment scheduled    Attendees: Patient:   07/03/2012 10:02 AM  Physican:  Patrick North, MD 07/03/2012 10:02 AM  Nursing:  Neill Loft, RN 07/03/2012 10:02 AM   Nursing:   Quintella Reichert, RN 07/03/2012 10:02 AM   Clinical Social Worker:  Juline Patch, LCSW 07/03/2012 10:02 AM   Other: Serena Colonel, FNP  07/03/2012 10:02 AM   Other:  Roswell Miners, RN     07/03/2012 10:02 AM Other:        07/03/2012 10:02 AM

## 2012-07-03 NOTE — Progress Notes (Signed)
D: Pt voices no complaints.  Facial expression sad with fair eye contact.  Mood and affect are depressed.  Speech is logical and coherent; no evidence of disorganized thought process or content.  Interaction assertive but minimal.  Attended wrapup group, stated "My day was all right, they finally listened to what I was saying." with reference to medications.  Remains quiet and isolative.  Denies SI/HI, AVH, and acute pain.  Contracting for safety on unit.  A: Pt difficult to engage.  Provided with nonverbal and verbal support; encouraged to approach staff at any time and to try to spend time on milieu.  Given Vistaril 50mg  PO @2009  for anxiety.  No other PRNs given.  All medications administered according to med orders and POC.  Q15 minute safety checks maintained as per unit policy.  R: Pt avoidant and reclusive.  Positive effect from Vistaril.  Safety maintained. Dion Saucier RN

## 2012-07-03 NOTE — Progress Notes (Signed)
Psychoeducational Group Note  Date:  07/03/2012 Time: 1100  Group Topic/Focus:  Self Care:   The focus of this group is to help patients understand the importance of self-care in order to improve or restore emotional, physical, spiritual, interpersonal, and financial health.  Participation Level:  Minimal  Participation Quality:  Appropriate and Attentive  Affect:  Appropriate  Cognitive:  Appropriate  Insight:  Developing/Improving  Engagement in Group:  Developing/Improving  Additional Comments:  Tyhir attended group. Patient defined self care in own terms. Patient then completed the self care assessment in the workbook. The workbook consisted of things on ways to improve or restore emotional, physical, psychological, spiritual, relationship areas of self care. Once patient completed assessment, patient was asked to share what areas needed improvement and what areas needed strengths. Areas that needed improvement patient was to set a goal on ways to improve self care. Patient shared only when asked to share.   Karleen Hampshire Brittini 07/03/2012, 6:27 PM

## 2012-07-03 NOTE — Progress Notes (Signed)
[facility] LCSW Group Therapy        Overcoming Obstacles 1:15 2:30 PM         07/03/2012 4:10 PM  Type of Therapy:  Group Therapy  Participation Level:  Active  Participation Quality:  Attentive  Affect:  Appropriate  Cognitive:  Appropriate  Insight:  Engaged  Engagement in Therapy:  Engaged  Modes of Intervention:  Discussion, Education, Exploration, Problem-solving and Support  Summary of Progress/Problems:  Patient shared an obstacle for him is a stumbling block.  Patient stated his stumbling block is holding things in rather than talking with this therapist.  Patient shared he plans to talk with therapist more and with family.  [name] 07/03/2012, 4:10 PM

## 2012-07-03 NOTE — Progress Notes (Signed)
[facility] Group Notes:  (Counselor/Nursing/MHT/Case Management/Adjunct)  07/03/2012 1:35 AM  Type of Therapy:  Psychoeducational Skills  Participation Level:  Minimal  Participation Quality:  Attentive  Affect:  Appropriate  Cognitive:  Appropriate  Insight:  Improving  Engagement in Group:  Developing/Improving  Engagement in Therapy:  Developing/Improving  Modes of Intervention:  Education  Summary of Progress/Problems: The patient states that he had a good day. He states that his medications have been changed. His goal for tomorrow is to get discharged.    Jolan Mealor S 07/03/2012, 1:35 AM

## 2012-07-03 NOTE — Progress Notes (Signed)
[facility] LCSW Aftercare Discharge Planning Group Note       8:30-9:30 AM   07/03/2012 4:09 PM  Participation Quality:  Appropriate  Affect:  Appropriate  Cognitive:  Appropriate  Insight:  Engaged  Engagement in Group:  Engaged  Modes of Intervention:  Exploration, Problem-solving and Support  Summary of Progress/Problems:  Patient shared he is doing great and was hopeful to discuss today.  He denied SI/HI and rated symptoms at one across the board.    [name] 07/03/2012, 4:09 PM

## 2012-07-04 ENCOUNTER — Emergency Department (HOSPITAL_COMMUNITY)
Admission: EM | Admit: 2012-07-04 | Discharge: 2012-07-05 | Disposition: A | Discharge status: Other Acute Inpatient Hospital | Payer: Self-pay | Attending: Emergency Medicine | Admitting: Emergency Medicine

## 2012-07-04 DIAGNOSIS — F3289 Other specified depressive episodes: Secondary | ICD-10-CM | POA: Insufficient documentation

## 2012-07-04 DIAGNOSIS — Z8719 Personal history of other diseases of the digestive system: Secondary | ICD-10-CM | POA: Insufficient documentation

## 2012-07-04 DIAGNOSIS — I10 Essential (primary) hypertension: Secondary | ICD-10-CM | POA: Insufficient documentation

## 2012-07-04 DIAGNOSIS — T50992A Poisoning by other drugs, medicaments and biological substances, intentional self-harm, initial encounter: Secondary | ICD-10-CM | POA: Insufficient documentation

## 2012-07-04 DIAGNOSIS — F32A Depression, unspecified: Secondary | ICD-10-CM

## 2012-07-04 DIAGNOSIS — I739 Peripheral vascular disease, unspecified: Secondary | ICD-10-CM | POA: Insufficient documentation

## 2012-07-04 DIAGNOSIS — T450X4A Poisoning by antiallergic and antiemetic drugs, undetermined, initial encounter: Secondary | ICD-10-CM | POA: Insufficient documentation

## 2012-07-04 DIAGNOSIS — F17200 Nicotine dependence, unspecified, uncomplicated: Secondary | ICD-10-CM | POA: Insufficient documentation

## 2012-07-04 DIAGNOSIS — T50902A Poisoning by unspecified drugs, medicaments and biological substances, intentional self-harm, initial encounter: Secondary | ICD-10-CM

## 2012-07-04 DIAGNOSIS — F329 Major depressive disorder, single episode, unspecified: Secondary | ICD-10-CM

## 2012-07-04 DIAGNOSIS — R45851 Suicidal ideations: Secondary | ICD-10-CM | POA: Insufficient documentation

## 2012-07-04 DIAGNOSIS — I2510 Atherosclerotic heart disease of native coronary artery without angina pectoris: Secondary | ICD-10-CM | POA: Insufficient documentation

## 2012-07-04 LAB — ETHANOL: Alcohol, Ethyl (B): 20 mg/dL — ABNORMAL HIGH (ref 0–11)

## 2012-07-04 LAB — CBC
HCT: 38.3 % — ABNORMAL LOW (ref 39.0–52.0)
Hemoglobin: 13.6 g/dL (ref 13.0–17.0)
MCH: 29.2 pg (ref 26.0–34.0)
MCHC: 35.5 g/dL (ref 30.0–36.0)
MCV: 82.2 fL (ref 78.0–100.0)
Platelets: 147 10*3/uL — ABNORMAL LOW (ref 150–400)
RBC: 4.66 MIL/uL (ref 4.22–5.81)
RDW: 14.4 % (ref 11.5–15.5)
WBC: 3.9 10*3/uL — ABNORMAL LOW (ref 4.0–10.5)

## 2012-07-04 LAB — COMPREHENSIVE METABOLIC PANEL
ALT: 14 U/L (ref 0–53)
AST: 18 U/L (ref 0–37)
Albumin: 3.2 g/dL — ABNORMAL LOW (ref 3.5–5.2)
Alkaline Phosphatase: 46 U/L (ref 39–117)
BUN: 11 mg/dL (ref 6–23)
CO2: 21 mEq/L (ref 19–32)
Calcium: 8.7 mg/dL (ref 8.4–10.5)
Chloride: 101 mEq/L (ref 96–112)
Creatinine, Ser: 0.86 mg/dL (ref 0.50–1.35)
GFR calc Af Amer: >90 mL/min (ref >90)
GFR calc non Af Amer: >90 mL/min (ref >90)
Glucose, Bld: 171 mg/dL — ABNORMAL HIGH (ref 70–99)
Potassium: 3.5 mEq/L (ref 3.5–5.1)
Sodium: 133 mEq/L — ABNORMAL LOW (ref 135–145)
Total Bilirubin: 0.2 mg/dL — ABNORMAL LOW (ref 0.3–1.2)
Total Protein: 6.4 g/dL (ref 6.0–8.3)

## 2012-07-04 LAB — RAPID URINE DRUG SCREEN, HOSP PERFORMED
Amphetamines: NOT DETECTED
Barbiturates: NOT DETECTED
Benzodiazepines: POSITIVE — AB
Cocaine: NOT DETECTED
Opiates: NOT DETECTED
Tetrahydrocannabinol: NOT DETECTED

## 2012-07-04 LAB — SALICYLATE LEVEL: Salicylate Lvl: <2 mg/dL — ABNORMAL LOW (ref 2.8–20.0)

## 2012-07-04 LAB — ACETAMINOPHEN LEVEL: Acetaminophen (Tylenol), Serum: <15 ug/mL (ref 10–30)

## 2012-07-04 LAB — GLUCOSE, CAPILLARY: Glucose-Capillary: 180 mg/dL — ABNORMAL HIGH (ref 70–99)

## 2012-07-04 MED ORDER — DIVALPROEX SODIUM ER 500 MG PO TB24: 500.0000 mg | ORAL_TABLET | ORAL | Status: DC

## 2012-07-04 MED ORDER — METOPROLOL SUCCINATE ER 50 MG PO TB24: 50.0000 mg | ORAL_TABLET | Freq: Every day | ORAL | Status: DC

## 2012-07-04 MED ORDER — SERTRALINE HCL 50 MG PO TABS: 150.0000 mg | ORAL_TABLET | Freq: Every day | ORAL | Status: DC

## 2012-07-04 MED ORDER — RISPERIDONE 3 MG PO TABS: 1.5000 mg | ORAL_TABLET | Freq: Two times a day (BID) | ORAL | Status: DC

## 2012-07-04 MED ORDER — MIRTAZAPINE 30 MG PO TABS: 30.0000 mg | ORAL_TABLET | Freq: Every day | ORAL | Status: DC

## 2012-07-04 MED ORDER — ATORVASTATIN CALCIUM 40 MG PO TABS: 40.0000 mg | ORAL_TABLET | Freq: Every day | ORAL | Status: DC

## 2012-07-04 MED ORDER — HYDROXYZINE HCL 50 MG PO TABS: 50.0000 mg | ORAL_TABLET | Freq: Four times a day (QID) | ORAL | Status: DC | PRN

## 2012-07-04 MED ORDER — AMLODIPINE BESYLATE 10 MG PO TABS: 10.0000 mg | ORAL_TABLET | Freq: Every day | ORAL | Status: DC

## 2012-07-04 MED ORDER — RISPERIDONE 3 MG PO TABS
1.5000 mg | ORAL_TABLET | ORAL | Status: DC
Start: 2012-07-04 — End: 2012-07-04
  Filled 2012-07-04 (×2): qty 14

## 2012-07-04 NOTE — ED Notes (Addendum)
PC contacted: recommend EKG and monitor for 6hrs.

## 2012-07-04 NOTE — Progress Notes (Signed)
Currently resting quietly in bed with eyes closed. Respirations even and unlabored. No acute distress noted. Safety has been maintained with Q15 minute observation. Will continue current POC 

## 2012-07-04 NOTE — Discharge Summary (Signed)
Reviewed

## 2012-07-04 NOTE — ED Notes (Signed)
Mother, [phone]

## 2012-07-04 NOTE — ED Provider Notes (Signed)
History    31 year old male with intentional drug overdose. Intent for self-harm. Patient states that he took approximately 40 tablets of 50 mg of Atarax, 14 tablets of 30 mg Remeron, 14 tablets with 3 mg Risperdal and and 10 tablets of 50 mg Zoloft. Ingestion at approximately 6 PM. Patient took these with alcohol. Patient states that very depressed. Patient was actually discharged from behavioral health today. No homicidal ideation. No hallucinations. Denies any drug use.  CSN: [id number]  Arrival date & time 07/04/12  Ernestina Columbia   First MD Initiated Contact with Patient 07/04/12 1949      Chief Complaint  Patient presents with  . Drug Overdose    (Consider location/radiation/quality/duration/timing/severity/associated sxs/prior treatment) HPI  Past Medical History  Diagnosis Date  . Seizures   . Hypertension   . Depression   . Pancreatitis   . Liver cirrhosis   . Coronary artery disease   . Angina   . Shortness of breath   . Headache   . Peripheral vascular disease April 2011    Left Pop    Past Surgical History  Procedure Date  . Chest surgery   . Left leg surgery   . Mastectomy     Family History  Problem Relation Age of Onset  . Stroke Other     History  Substance Use Topics  . Smoking status: Current Every Day Smoker -- 0.2 packs/day for 4 years    Types: Cigarettes  . Smokeless tobacco: Never Used  . Alcohol Use: 1.8 oz/week    2 Cans of beer, 1 Glasses of wine per week     Comment: pint daily      Review of Systems  All systems reviewed and negative, other than as noted in HPI.   Allergies  Morphine and Penicillins  Home Medications   Current Outpatient Rx  Name  Route  Sig  Dispense  Refill  . AMLODIPINE BESYLATE 10 MG PO TABS   Oral   Take 1 tablet (10 mg total) by mouth daily. For blood pressure   30 tablet   0   . ATORVASTATIN CALCIUM 40 MG PO TABS   Oral   Take 1 tablet (40 mg total) by mouth daily. For high cholesterol          . DIVALPROEX SODIUM ER 500 MG PO TB24   Oral   Take 1 tablet (500 mg total) by mouth 2 (two) times daily in the am and at bedtime.. For mood   90 tablet   0   . HYDROXYZINE HCL 50 MG PO TABS   Oral   Take 1 tablet (50 mg total) by mouth every 6 (six) hours as needed for anxiety (anxiety).   30 tablet   0   . METOPROLOL SUCCINATE ER 50 MG PO TB24   Oral   Take 1 tablet (50 mg total) by mouth daily. For blood pressure   30 tablet   0   . MIRTAZAPINE 30 MG PO TABS   Oral   Take 1 tablet (30 mg total) by mouth at bedtime.   30 tablet   0   . RISPERIDONE 3 MG PO TABS   Oral   Take 0.5 tablets (1.5 mg total) by mouth 2 (two) times daily.   30 tablet   0   . SERTRALINE HCL 50 MG PO TABS   Oral   Take 3 tablets (150 mg total) by mouth daily.   90 tablet   0  BP 122/72  Pulse 96  Temp 98.3 F (36.8 C) (Oral)  Resp 24  SpO2 96%  Physical Exam  Nursing note and vitals reviewed. Constitutional: He appears well-developed and well-nourished. No distress.       Laying in bed. No acute distress. Obese.  HENT:  Head: Normocephalic and atraumatic.  Eyes: Pupils are equal, round, and reactive to light. Right eye exhibits no discharge. Left eye exhibits no discharge.       Conjunctival injection bilaterally.  Neck: Neck supple.  Cardiovascular: Normal rate, regular rhythm and normal heart sounds.  Exam reveals no gallop and no friction rub.   No murmur heard. Pulmonary/Chest: Effort normal and breath sounds normal. No respiratory distress.  Abdominal: Soft. He exhibits no distension. There is no tenderness.  Musculoskeletal: He exhibits no edema and no tenderness.  Neurological: He is alert.       Patient is a little bit drowsy, but GCS is 15. No focal deficits noted.  Skin: Skin is warm and dry.  Psychiatric:       Flat affect. Poor eye contact. Speech is very soft, but clear. Does not appear to responding to internal stimuli. Does not seem to be significantly  cognitively impaired.    ED Course  Procedures (including critical care time)  CRITICAL CARE Performed by: Raeford Razor   Total critical care time: 30 minutes  Critical care time was exclusive of separately billable procedures and treating other patients.  Critical care was necessary to treat or prevent imminent or life-threatening deterioration.  Critical care was time spent personally by me on the following activities: development of treatment plan with patient and/or surrogate as well as nursing, discussions with consultants, evaluation of patient's response to treatment, examination of patient, obtaining history from patient or surrogate, ordering and performing treatments and interventions, ordering and review of laboratory studies, ordering and review of radiographic studies, pulse oximetry and re-evaluation of patient's condition.   Labs Reviewed  CBC - Abnormal; Notable for the following:    WBC 3.9 (*)     HCT 38.3 (*)     Platelets 147 (*)     All other components within normal limits  COMPREHENSIVE METABOLIC PANEL - Abnormal; Notable for the following:    Sodium 133 (*)     Glucose, Bld 171 (*)     Albumin 3.2 (*)     Total Bilirubin 0.2 (*)     All other components within normal limits  ETHANOL - Abnormal; Notable for the following:    Alcohol, Ethyl (B) 20 (*)     All other components within normal limits  SALICYLATE LEVEL - Abnormal; Notable for the following:    Salicylate Lvl <2.0 (*)     All other components within normal limits  URINE RAPID DRUG SCREEN (HOSP PERFORMED) - Abnormal; Notable for the following:    Benzodiazepines POSITIVE (*)     All other components within normal limits  GLUCOSE, CAPILLARY - Abnormal; Notable for the following:    Glucose-Capillary 180 (*)     All other components within normal limits  ACETAMINOPHEN LEVEL   No results found.  EKG:  Rhythm: Normal sinus rhythm Vent. rate 87 BPM PR interval 152 ms QRS duration 90  ms QT/QTc 384/462 ms ST segments: Non-specific ST changes. There is diffuse T-wave flattening.     1. Depression   2. Intentional drug overdose   3. Suicidal ideation       MDM  31 year old male with severe depression. Intentional  drug overdose with intent for self-harm. Labs fairly unremarkable. Patient is currently hemodynamically stable. We'll continue to observe. Poison control recommends observation for a period of 6 hours. If remains stable at that point we'll then pursue psychiatric evaluation.        Raeford Razor, MD 07/04/12 [phone]

## 2012-07-04 NOTE — ED Notes (Signed)
Bed:RESA<BR> Expected date:<BR> Expected time:<BR> Means of arrival:<BR> Comments:<BR> EMS

## 2012-07-04 NOTE — ED Notes (Signed)
[id number]<AV> Expected date:<BR> Expected time:<BR> Means of arrival:<BR> Comments:<BR> RES A

## 2012-07-04 NOTE — ED Notes (Signed)
Pt overdose on: 40tabs x50MG Atarax, 14tabs x30mg  Remeron, 14x3mg  Risperdal, 10 x50mg  Zoloft. Pt took pills at 6pm with ETOH.pt is SI and intended to harm self.

## 2012-07-04 NOTE — [facility] Suicide Risk Assessment (Signed)
Suicide Risk Assessment  Discharge Assessment     Demographic Factors:  Male, Cardell Peach, lesbian, or bisexual orientation, Low socioeconomic status and Unemployed  Mental Status Per Nursing Assessment::   On Admission:  NA  Current Mental Status by Physician: Alert and oriented to 4. Denies SI/HI/VH/AH.  Loss Factors: Financial problems/change in socioeconomic status  Historical Factors: Impulsivity  Risk Reduction Factors:   Positive coping skills or problem solving skills  Continued Clinical Symptoms:  Depression:   Comorbid alcohol abuse/dependence Recent sense of peace/wellbeing Alcohol/Substance Abuse/Dependencies  Cognitive Features That Contribute To Risk:  Cognitively intact  Suicide Risk:  Minimal: No identifiable suicidal ideation.  Patients presenting with no risk factors but with morbid ruminations; may be classified as minimal risk based on the severity of the depressive symptoms  Discharge Diagnoses:   AXIS I:  Alcohol Abuse and Depressive Disorder NOS AXIS II:  No diagnosis AXIS III:   Past Medical History  Diagnosis Date  . Seizures   . Hypertension   . Depression   . Pancreatitis   . Liver cirrhosis   . Coronary artery disease   . Angina   . Shortness of breath   . Headache   . Peripheral vascular disease April 2011    Left Pop   AXIS IV:  housing problems and occupational problems AXIS V:  61-70 mild symptoms  Plan Of Care/Follow-up recommendations:  Activity:  normal Diet:  normal  Is patient on multiple antipsychotic therapies at discharge:  No   Has Patient had three or more failed trials of antipsychotic monotherapy by history:  No  Recommended Plan for Multiple Antipsychotic Therapies: NA  Niamya Vittitow 07/04/2012, 10:28 AM

## 2012-07-04 NOTE — Progress Notes (Signed)
Patient ID: [name], male   DOB: 1980/11/27, 31 y.o.   MRN: [id number] Order written per physician to discharge patient; patient denies SI/HI and A/V hallucinations; patient has no other complaints at this time; patient received copy of AVS, prescriptions, and samples once it was reviewed with him; patient left ambulatory

## 2012-07-04 NOTE — Discharge Summary (Signed)
Physician Discharge Summary Note  Patient:  [name] is an 31 y.o., male MRN:  [id number] DOB:  [birth year] Patient phone:  [phone] (home)  Patient address:   [address] [place] [address],   Date of Admission:  06/28/2012 Date of Discharge: 07/04/2012  Reason for Admission:  Depression with intentional overdose  Discharge Diagnoses: Principal Problem:  *Major depressive disorder, recurrent episode  Review of Systems  Constitutional: Negative.   HENT: Negative.   Eyes: Negative.   Respiratory: Negative.   Cardiovascular: Negative.   Gastrointestinal: Negative.   Genitourinary: Negative.   Musculoskeletal: Negative.   Skin: Negative.   Neurological: Negative.   Endo/Heme/Allergies: Negative.   Psychiatric/Behavioral: Positive for substance abuse.   Axis Diagnosis:   AXIS I:  Major Depression, Recurrent severe and Substance Abuse AXIS II:  Deferred AXIS III:   Past Medical History  Diagnosis Date  . Seizures   . Hypertension   . Depression   . Pancreatitis   . Liver cirrhosis   . Coronary artery disease   . Angina   . Shortness of breath   . Headache   . Peripheral vascular disease April 2011    Left Pop   AXIS IV:  economic problems, housing problems, occupational problems, other psychosocial or environmental problems, problems related to social environment and problems with primary support group AXIS V:  61-70 mild symptoms  Level of Care:  OP  Hospital Course:  Medication management for depression and anxiety, Risperdal and Zoloft  Increased for depression and mood stability, vistaril for anxiety.  Rx written for his psych medications and his blood pressure medications.  [name] denies depression/suicidal/homicidal ideations and hallucinations, sleep and appetite good, requests discharge, stable for discharge.  Consults:  None  Significant Diagnostic Studies:  labs: Completed and reviewed, stable  Discharge Vitals:   Blood pressure  117/74, pulse 83, temperature 98 F (36.7 C), temperature source Oral, resp. rate 16, height 5\' 6"  (1.676 m), weight 87.998 kg (194 lb). Body mass index is 31.31 kg/(m^2). Lab Results:   No results found for this or any previous visit (from the past 72 hour(s)).  Physical Findings: AIMS: Facial and Oral Movements Muscles of Facial Expression: None, normal Lips and Perioral Area: None, normal Jaw: None, normal Tongue: None, normal,Extremity Movements Upper (arms, wrists, hands, fingers): None, normal Lower (legs, knees, ankles, toes): None, normal, Trunk Movements Neck, shoulders, hips: None, normal, Overall Severity Severity of abnormal movements (highest score from questions above): None, normal Incapacitation due to abnormal movements: None, normal [name] awareness of abnormal movements (rate only patient's report): No Awareness, Dental Status Current problems with teeth and/or dentures?: No Does patient usually wear dentures?: No  CIWA:  CIWA-Ar Total: 0  COWS:  COWS Total Score: 0   Psychiatric Specialty Exam: See Psychiatric Specialty Exam and Suicide Risk Assessment completed by Attending Physician prior to discharge.  Discharge destination:  Home  Is patient on multiple antipsychotic therapies at discharge:  No   Has Patient had three or more failed trials of antipsychotic monotherapy by history:  No Recommended Plan for Multiple Antipsychotic Therapies:  N/A  Discharge Orders    Future Appointments: Provider: Department: Dept Phone: Center:   01/12/2013 3:00 PM [facility] [phone] [facility]   01/12/2013 3:30 PM [facility] [phone] [facility]   01/12/2013 4:00 PM Evern Bio, NP [facility] [phone] [facility]     Future Orders Please  Complete By Expires   Diet - low sodium heart healthy      Activity as tolerated - No restrictions          Medication  List     As of 07/04/2012 12:35 PM    STOP taking these medications         gabapentin 300 MG capsule   Commonly known as: NEURONTIN      TAKE these medications      Indication    amLODipine 10 MG tablet   Commonly known as: NORVASC   Take 1 tablet (10 mg total) by mouth daily. For blood pressure    Indication: High Blood Pressure      atorvastatin 40 MG tablet   Commonly known as: LIPITOR   Take 1 tablet (40 mg total) by mouth daily. For high cholesterol    Indication: hyperlipidemia      divalproex 500 MG 24 hr tablet   Commonly known as: DEPAKOTE ER   Take 1 tablet (500 mg total) by mouth 2 (two) times daily in the am and at bedtime.. For mood    Indication: mood stabilization      hydrOXYzine 50 MG tablet   Commonly known as: ATARAX/VISTARIL   Take 1 tablet (50 mg total) by mouth every 6 (six) hours as needed for anxiety (anxiety).       metoprolol succinate 50 MG 24 hr tablet   Commonly known as: TOPROL-XL   Take 1 tablet (50 mg total) by mouth daily. For blood pressure    Indication: Feeling Anxious, High Blood Pressure      mirtazapine 30 MG tablet   Commonly known as: REMERON   Take 1 tablet (30 mg total) by mouth at bedtime.    Indication: Trouble Sleeping      risperiDONE 3 MG tablet   Commonly known as: RISPERDAL   Take 0.5 tablets (1.5 mg total) by mouth 2 (two) times daily.    Indication: mood stability      sertraline 50 MG tablet   Commonly known as: ZOLOFT   Take 3 tablets (150 mg total) by mouth daily.    Indication: depression           Follow-up Information    Follow up with [facility]. On 07/07/2012. (Friday, July 07, 2013 at 1:00 PM)    Contact information:   [address] [place], [place]  [address]      Follow up with [facility]. On 07/05/2012. (You are scheduled for an admission assessment at 8 AM on Wednesday, July 05, 2012)    Contact information:   [address] [place], [place]   [address]          Follow-up recommendations:  Activity as tolerated, low-sodium heart healthy diet  Comments:  [name] follow-up appointments in place.  Total Discharge Time:  Greater than 30 minutes  Signed: Nanine Means, PMH-NP 07/04/2012, 12:35 PM

## 2012-07-05 ENCOUNTER — Inpatient Hospital Stay (HOSPITAL_COMMUNITY)
Admission: AD | Admit: 2012-07-05 | Discharge: 2012-07-11 | DRG: 885 | Disposition: A | Discharge status: Home / Self Care | Payer: Federal, State, Local not specified - Other | Source: Ambulatory Visit | Attending: Psychiatry | Admitting: Psychiatry

## 2012-07-05 ENCOUNTER — Encounter (HOSPITAL_COMMUNITY): Payer: Self-pay | Admitting: Emergency Medicine

## 2012-07-05 ENCOUNTER — Inpatient Hospital Stay (HOSPITAL_COMMUNITY)
Admission: AD | Admit: 2012-07-05 | Payer: Federal, State, Local not specified - Other | Source: Ambulatory Visit | Admitting: Psychiatry

## 2012-07-05 ENCOUNTER — Emergency Department (HOSPITAL_COMMUNITY): Payer: Self-pay

## 2012-07-05 DIAGNOSIS — F411 Generalized anxiety disorder: Secondary | ICD-10-CM | POA: Diagnosis present

## 2012-07-05 DIAGNOSIS — F339 Major depressive disorder, recurrent, unspecified: Principal | ICD-10-CM

## 2012-07-05 DIAGNOSIS — F10929 Alcohol use, unspecified with intoxication, unspecified: Secondary | ICD-10-CM

## 2012-07-05 DIAGNOSIS — K7460 Unspecified cirrhosis of liver: Secondary | ICD-10-CM | POA: Diagnosis present

## 2012-07-05 DIAGNOSIS — I739 Peripheral vascular disease, unspecified: Secondary | ICD-10-CM

## 2012-07-05 DIAGNOSIS — F1010 Alcohol abuse, uncomplicated: Secondary | ICD-10-CM

## 2012-07-05 DIAGNOSIS — F1020 Alcohol dependence, uncomplicated: Secondary | ICD-10-CM | POA: Diagnosis present

## 2012-07-05 DIAGNOSIS — F1410 Cocaine abuse, uncomplicated: Secondary | ICD-10-CM | POA: Diagnosis present

## 2012-07-05 DIAGNOSIS — F419 Anxiety disorder, unspecified: Secondary | ICD-10-CM

## 2012-07-05 DIAGNOSIS — I10 Essential (primary) hypertension: Secondary | ICD-10-CM | POA: Diagnosis present

## 2012-07-05 DIAGNOSIS — F4310 Post-traumatic stress disorder, unspecified: Secondary | ICD-10-CM

## 2012-07-05 DIAGNOSIS — F17200 Nicotine dependence, unspecified, uncomplicated: Secondary | ICD-10-CM | POA: Diagnosis present

## 2012-07-05 DIAGNOSIS — Z23 Encounter for immunization: Secondary | ICD-10-CM

## 2012-07-05 LAB — CBC WITH DIFFERENTIAL/PLATELET
Eosinophils Relative: 0 % (ref 0–5)
HCT: 37.9 % — ABNORMAL LOW (ref 39.0–52.0)
Lymphocytes Relative: 24 % (ref 12–46)
Lymphs Abs: 1.3 10*3/uL (ref 0.7–4.0)
MCV: 82.9 fL (ref 78.0–100.0)
Monocytes Absolute: 0.4 10*3/uL (ref 0.1–1.0)
RBC: 4.57 MIL/uL (ref 4.22–5.81)
RDW: 14.7 % (ref 11.5–15.5)
WBC: 5.6 10*3/uL (ref 4.0–10.5)

## 2012-07-05 LAB — COMPREHENSIVE METABOLIC PANEL
ALT: 14 U/L (ref 0–53)
AST: 19 U/L (ref 0–37)
Albumin: 3.5 g/dL (ref 3.5–5.2)
Chloride: 101 mEq/L (ref 96–112)
Creatinine, Ser: 0.91 mg/dL (ref 0.50–1.35)
Sodium: 136 mEq/L (ref 135–145)
Total Bilirubin: 0.2 mg/dL — ABNORMAL LOW (ref 0.3–1.2)

## 2012-07-05 MED ORDER — ALUM & MAG HYDROXIDE-SIMETH 200-200-20 MG/5ML PO SUSP
30.0000 mL | ORAL | Status: DC | PRN
  Administered 2012-07-10: 30 mL via ORAL

## 2012-07-05 MED ORDER — ACETAMINOPHEN 325 MG PO TABS
650.0000 mg | ORAL_TABLET | Freq: Four times a day (QID) | ORAL | Status: DC | PRN
  Administered 2012-07-10: 650 mg via ORAL

## 2012-07-05 MED ORDER — SODIUM CHLORIDE 0.9 % IV BOLUS (SEPSIS)
500.0000 mL | Freq: Once | INTRAVENOUS | Status: AC
  Administered 2012-07-05: 16:00:00 via INTRAVENOUS

## 2012-07-05 MED ORDER — AMLODIPINE BESYLATE 10 MG PO TABS
10.0000 mg | ORAL_TABLET | Freq: Every day | ORAL | Status: DC
  Administered 2012-07-05: 10 mg via ORAL
  Filled 2012-07-05: qty 1

## 2012-07-05 MED ORDER — METOPROLOL SUCCINATE ER 50 MG PO TB24
50.0000 mg | ORAL_TABLET | Freq: Every day | ORAL | Status: DC
  Administered 2012-07-05: 50 mg via ORAL
  Filled 2012-07-05: qty 1

## 2012-07-05 MED ORDER — ONDANSETRON HCL 4 MG PO TABS
4.0000 mg | ORAL_TABLET | Freq: Three times a day (TID) | ORAL | Status: DC | PRN
  Administered 2012-07-05 (×2): 4 mg via ORAL
  Filled 2012-07-05 (×2): qty 1

## 2012-07-05 MED ORDER — NICOTINE 21 MG/24HR TD PT24
21.0000 mg | MEDICATED_PATCH | Freq: Every day | TRANSDERMAL | Status: DC
  Administered 2012-07-06 – 2012-07-11 (×6): 21 mg via TRANSDERMAL
  Filled 2012-07-05 (×9): qty 1

## 2012-07-05 MED ORDER — PROMETHAZINE HCL 25 MG/ML IJ SOLN
25.0000 mg | Freq: Once | INTRAMUSCULAR | Status: AC
  Administered 2012-07-05: 25 mg via INTRAMUSCULAR
  Filled 2012-07-05: qty 1

## 2012-07-05 MED ORDER — ATORVASTATIN CALCIUM 40 MG PO TABS
40.0000 mg | ORAL_TABLET | Freq: Every day | ORAL | Status: DC
  Administered 2012-07-06 – 2012-07-10 (×5): 40 mg via ORAL
  Filled 2012-07-05 (×7): qty 1

## 2012-07-05 MED ORDER — LORAZEPAM 1 MG PO TABS
1.0000 mg | ORAL_TABLET | Freq: Once | ORAL | Status: AC
  Administered 2012-07-05: 1 mg via ORAL
  Filled 2012-07-05: qty 1

## 2012-07-05 MED ORDER — ONDANSETRON HCL 4 MG/2ML IJ SOLN
4.0000 mg | Freq: Once | INTRAMUSCULAR | Status: AC
  Administered 2012-07-05: 4 mg via INTRAVENOUS

## 2012-07-05 MED ORDER — ACETAMINOPHEN 325 MG PO TABS
650.0000 mg | ORAL_TABLET | ORAL | Status: DC | PRN
  Administered 2012-07-05 (×2): 650 mg via ORAL
  Filled 2012-07-05 (×2): qty 2

## 2012-07-05 MED ORDER — DIVALPROEX SODIUM ER 500 MG PO TB24
500.0000 mg | ORAL_TABLET | ORAL | Status: DC
  Administered 2012-07-06 – 2012-07-11 (×11): 500 mg via ORAL
  Filled 2012-07-05 (×15): qty 1

## 2012-07-05 MED ORDER — SODIUM CHLORIDE 0.9 % IV SOLN
INTRAVENOUS | Status: DC
  Administered 2012-07-05: 17:00:00 via INTRAVENOUS

## 2012-07-05 MED ORDER — MAGNESIUM HYDROXIDE 400 MG/5ML PO SUSP: 30.0000 mL | Freq: Every day | ORAL | Status: DC | PRN

## 2012-07-05 MED ORDER — DIVALPROEX SODIUM ER 500 MG PO TB24
500.0000 mg | ORAL_TABLET | ORAL | Status: DC
  Administered 2012-07-05 (×2): 500 mg via ORAL
  Filled 2012-07-05 (×4): qty 1

## 2012-07-05 MED ORDER — METOPROLOL SUCCINATE ER 50 MG PO TB24
50.0000 mg | ORAL_TABLET | Freq: Every day | ORAL | Status: DC
  Administered 2012-07-06 – 2012-07-11 (×6): 50 mg via ORAL
  Filled 2012-07-05 (×8): qty 1

## 2012-07-05 MED ORDER — IBUPROFEN 800 MG PO TABS
800.0000 mg | ORAL_TABLET | Freq: Once | ORAL | Status: AC
  Administered 2012-07-05: 800 mg via ORAL
  Filled 2012-07-05: qty 1

## 2012-07-05 MED ORDER — ONDANSETRON HCL 4 MG/2ML IJ SOLN
INTRAMUSCULAR | Status: AC
  Administered 2012-07-05: 4 mg via INTRAVENOUS
  Filled 2012-07-05: qty 2

## 2012-07-05 MED ORDER — PROMETHAZINE HCL 25 MG/ML IJ SOLN
25.0000 mg | INTRAMUSCULAR | Status: DC | PRN
  Administered 2012-07-05: 25 mg via INTRAMUSCULAR
  Filled 2012-07-05: qty 1

## 2012-07-05 MED ORDER — AMLODIPINE BESYLATE 10 MG PO TABS
10.0000 mg | ORAL_TABLET | Freq: Every day | ORAL | Status: DC
  Administered 2012-07-06 – 2012-07-11 (×6): 10 mg via ORAL
  Filled 2012-07-05 (×8): qty 1

## 2012-07-05 MED ORDER — ONDANSETRON 4 MG PO TBDP
ORAL_TABLET | ORAL | Status: AC
  Administered 2012-07-05: 4 mg
  Filled 2012-07-05: qty 1

## 2012-07-05 MED ORDER — ATORVASTATIN CALCIUM 40 MG PO TABS
40.0000 mg | ORAL_TABLET | Freq: Every day | ORAL | Status: DC
  Administered 2012-07-05: 40 mg via ORAL
  Filled 2012-07-05: qty 1

## 2012-07-05 NOTE — ED Notes (Signed)
Visitor at bedside.

## 2012-07-05 NOTE — ED Provider Notes (Signed)
Patient was scheduled to go to behavioral health for psychiatric treatment and evaluation but he continued to have vomiting that was requiring medications every 2-3 hours.  Because of persistent vomiting and was elected to DC his admission to psychiatric present time and admit to medicine for further evaluation and stabilization.  Nelia Shi, MD 07/05/12 [phone]

## 2012-07-05 NOTE — ED Notes (Signed)
Patient transported to X-ray 

## 2012-07-05 NOTE — ED Notes (Addendum)
Pt began throwing up multiple times after signing form.  Called MD, wants to order IV and possibly admit to hospital for vomiting.  Charge RN informed.  ACT team informed

## 2012-07-05 NOTE — ED Provider Notes (Signed)
  Physical Exam  BP 116/68  Pulse 89  Temp 98.4 F (36.9 C) (Oral)  Resp 20  SpO2 96%  Physical Exam  ED Course  Procedures  MDM Change of shift - I have evaluated the pt - he admits to taking large OD of meds - he has persistent sleepiness but is answering all questions appropriately - he has been seen by telepsych who states that he should be admitted but admits that he is manipulative - he states that when he lives by himself he is unable to control himself and does "stupid stuff".      Vida Roller, MD 07/05/12 [phone]

## 2012-07-05 NOTE — Progress Notes (Signed)
CARE MANAGEMENT ED NOTE 07/05/2012  Patient:  [name], [name]   Account Number:  [id number]  Date Initiated:  07/05/2012  Documentation initiated by:  Edd Arbour  Subjective/Objective Assessment:   31 m self pay patient c/o OD PMH OD pcp listed as [facility] previous [facility] MD     Subjective/Objective Assessment Detail:   Abnormal labs noted in EPIC NA 133 Albumin 3.2 etoh 20 drug screen positive for Benzodiazepines wbc 3.9 hct 38.3 plt 147     Action/Plan:   EPIC clincals reviewed Medically cleared pending placement   Action/Plan Detail:   Anticipated DC Date:  07/05/2012     Status Recommendation to Physician:   Result of Recommendation:    Other ED Services  Consult Working Plan    DC Planning Services  Other    Choice offered to / List presented to:            Status of service:  Completed, signed off  ED Comments:   ED Comments Detail:

## 2012-07-05 NOTE — ED Notes (Signed)
Pt transferred from main ED, presents SI overdose of 40 Atarax, 14 Remeron, 14 Risperdal and 10 Zoloft tabs.  Pt reports feeling hopeless has attempted SI in past.  Denies HI or AV hallucinations.  Pt nauseated at present.

## 2012-07-05 NOTE — ED Notes (Signed)
[id number]<AV> Expected date:<BR> Expected time:<BR> Means of arrival:<BR> Comments:<BR> Hold for 37

## 2012-07-05 NOTE — ED Provider Notes (Signed)
  Physical Exam  BP 125/68  Pulse 62  Temp 97.6 F (36.4 C) (Oral)  Resp 16  SpO2 96%  Physical Exam  ED Course  Procedures  MDM [name] nausea vomiting is decreased. X-ray and lab work is reassuring. He is tolerated orals. He's been accepted back at behavioral health hospital      Juliet Rude. Rubin Payor, MD 07/05/12 2238
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
## 2012-07-05 NOTE — ED Notes (Signed)
Fixed pt a cup of ginger ale

## 2012-07-06 ENCOUNTER — Encounter (HOSPITAL_COMMUNITY): Payer: Self-pay | Admitting: *Deleted

## 2012-07-06 DIAGNOSIS — F4310 Post-traumatic stress disorder, unspecified: Secondary | ICD-10-CM | POA: Diagnosis present

## 2012-07-06 DIAGNOSIS — F1410 Cocaine abuse, uncomplicated: Secondary | ICD-10-CM

## 2012-07-06 DIAGNOSIS — F411 Generalized anxiety disorder: Secondary | ICD-10-CM

## 2012-07-06 DIAGNOSIS — F1020 Alcohol dependence, uncomplicated: Secondary | ICD-10-CM

## 2012-07-06 DIAGNOSIS — F419 Anxiety disorder, unspecified: Secondary | ICD-10-CM | POA: Diagnosis present

## 2012-07-06 DIAGNOSIS — F339 Major depressive disorder, recurrent, unspecified: Principal | ICD-10-CM

## 2012-07-06 MED ORDER — HYDROXYZINE HCL 50 MG PO TABS
50.0000 mg | ORAL_TABLET | Freq: Four times a day (QID) | ORAL | Status: DC | PRN
  Administered 2012-07-06 – 2012-07-10 (×5): 50 mg via ORAL
  Filled 2012-07-06: qty 1
  Filled 2012-07-06: qty 6
  Filled 2012-07-06: qty 1

## 2012-07-06 MED ORDER — DULOXETINE HCL 30 MG PO CPEP
30.0000 mg | ORAL_CAPSULE | Freq: Every day | ORAL | Status: DC
  Administered 2012-07-06 – 2012-07-11 (×6): 30 mg via ORAL
  Filled 2012-07-06 (×9): qty 1

## 2012-07-06 MED ORDER — ARIPIPRAZOLE 2 MG PO TABS
2.0000 mg | ORAL_TABLET | Freq: Once | ORAL | Status: AC
  Administered 2012-07-06: 2 mg via ORAL
  Filled 2012-07-06: qty 1

## 2012-07-06 MED ORDER — MIRTAZAPINE 15 MG PO TABS
45.0000 mg | ORAL_TABLET | Freq: Every day | ORAL | Status: DC
  Administered 2012-07-06 – 2012-07-10 (×5): 45 mg via ORAL
  Filled 2012-07-06 (×7): qty 1

## 2012-07-06 NOTE — Progress Notes (Signed)
D: Patient up and in the milieu this evening, has been attending and participating in various milieu activities. Pt has spoken about some feelings of depression but denied any suicidal thoughts. Pt did not endorse any complaints of pain this evening and did receive all medications without incident.  A: Pt provided with support and encouragement.  R:Safety maintained, will continue to monitor.

## 2012-07-06 NOTE — Progress Notes (Signed)
Patient ID: [name], male   DOB: [birth year], 31 y.o.   MRN: [id number] He has been up and has little interaction with peers and staff, but will make needs known. He has been to part of the group.Self inventory: depression and hopeless at 5, denies SI thoughts.

## 2012-07-06 NOTE — Clinical Social Work Note (Signed)
[facility] LCSW Aftercare Discharge Planning Group Note  07/06/2012 8:45 AM  Participation Quality:  Did Not Attend  Annalicia Renfrew Horton, LCSWA 07/06/2012 8:57 AM   

## 2012-07-06 NOTE — Clinical Social Work Note (Signed)
[facility] LCSW Group Therapy  07/06/2012  1:15 PM   Type of Therapy:  Group Therapy  Participation Level:  Active  Participation Quality:  Appropriate and Attentive  Affect:  Appropriate  Cognitive:  Appropriate  Insight:  Developing/Improving  Engagement in Therapy:  Developing/Improving  Modes of Intervention:  Clarification, Discussion, Exploration, Problem-solving, Socialization and Support  Summary of Progress/Problems: The topic for group was balance in life.  Pt participated in the discussion about when their life was in balance and out of balance and how this feels.  Pt discussed ways to get back in balance and short term goals they can work on to get where they want to be.  Pt actively listened to group discussion but did not participate.    [name], LCSWA 07/06/2012 2:15 PM

## 2012-07-06 NOTE — [facility] Suicide Risk Assessment (Signed)
Suicide Risk Assessment  Admission Assessment     Nursing information obtained from:  Patient Demographic factors:  Gay, lesbian, or bisexual orientation Current Mental Status:  Suicidal ideation indicated by patient Loss Factors:  Decrease in vocational status;Financial problems / change in socioeconomic status Historical Factors:  Prior suicide attempts Risk Reduction Factors:  Living with another person, especially a relative  CLINICAL FACTORS:   Severe Anxiety and/or Agitation Bipolar Disorder:   Depressive phase Alcohol/Substance Abuse/Dependencies  COGNITIVE FEATURES THAT CONTRIBUTE TO RISK:  Closed-mindedness Thought constriction (tunnel vision)    SUICIDE RISK:   Moderate:  Frequent suicidal ideation with limited intensity, and duration, some specificity in terms of plans, no associated intent, good self-control, limited dysphoria/symptomatology, some risk factors present, and identifiable protective factors, including available and accessible social support.  PLAN OF CARE: Reassess medications/coping skills/relapse prevention   Yatziri Wainwright A 07/06/2012, 10:37 AM

## 2012-07-06 NOTE — Progress Notes (Signed)
Adult Psychosocial Assessment Update Interdisciplinary Team  Previous [facility] Hospital admissions/discharges:  Admissions Discharges  Date: 06/28/12 Date: 07/04/12  Date: 03/29/12 Date: 04/04/12  Date: 10/12/11 Date: 10/21/11  Date: Date:  Date: Date:   Changes since the last Psychosocial Assessment (including adherence to outpatient mental health and/or substance abuse treatment, situational issues contributing to decompensation and/or relapse). Pt discharged from [facility] on 07/04/12.  Pt states that he was to follow up at [facility] on 12/18 but was still suicidal so he went to the ED instead.  Pt states that he continues to be suicidal.  No further changes from prior admission.               Discharge Plan 1. Will you be returning to the same living situation after discharge?   Yes: X No:      If no, what is your plan?           2. Would you like a referral for services when you are discharged? Yes: X    If yes, for what services? Pt states that he wants long term treatment outside of [place].    No:              Summary and Recommendations (to be completed by the evaluator) Patient is a 31 year old African American Male with a diagnosis of Psychotic Disorder NOS.  Patient lives in [place] alone.  Patient will benefit from crisis stabilization, medication evaluation, group therapy and psycho education in addition to case management for discharge planning.                         Signature:  Carmina Miller, 07/06/2012 8:29 AM

## 2012-07-06 NOTE — Tx Team (Signed)
Initial Interdisciplinary Treatment Plan  PATIENT STRENGTHS: (choose at least two) Motivation for treatment/growth Supportive family/friends  PATIENT STRESSORS: Financial difficulties Health problems Medication change or noncompliance Substance abuse   PROBLEM LIST: Problem List/Patient Goals Date to be addressed Date deferred Reason deferred Estimated date of resolution  Suicide Attempt 07/05/12                                                      DISCHARGE CRITERIA:  Improved stabilization in mood, thinking, and/or behavior Motivation to continue treatment in a less acute level of care Verbal commitment to aftercare and medication compliance Withdrawal symptoms are absent or subacute and managed without 24-hour nursing intervention  PRELIMINARY DISCHARGE PLAN: Attend PHP/IOP Attend 12-step recovery group Placement in alternative living arrangements  PATIENT/FAMIILY INVOLVEMENT: This treatment plan has been presented to and reviewed with the patient, [name], and/or family member.  The patient and family have been given the opportunity to ask questions and make suggestions.  Roselie Skinner [facility] 07/06/2012, 2:06 AM

## 2012-07-06 NOTE — Progress Notes (Signed)
Patient did attend the evening karaoke group.  

## 2012-07-06 NOTE — Progress Notes (Signed)
Patient Discharge Instructions:  After Visit Summary (AVS):   Faxed to:  07/06/12 Psychiatric Admission Assessment Note:   Faxed to:  07/06/12 Suicide Risk Assessment - Discharge Assessment:   Faxed to:  07/06/12 Faxed/Sent to the Next Level Care provider:  07/06/12 Faxed to [facility] @ [phone] Faxed to [facility] @ [phone]  Jerelene Redden, 07/06/2012, 3:56 PM
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
## 2012-07-07 DIAGNOSIS — F1910 Other psychoactive substance abuse, uncomplicated: Secondary | ICD-10-CM

## 2012-07-07 DIAGNOSIS — F4310 Post-traumatic stress disorder, unspecified: Secondary | ICD-10-CM

## 2012-07-07 MED ORDER — ARIPIPRAZOLE 2 MG PO TABS
2.0000 mg | ORAL_TABLET | Freq: Every day | ORAL | Status: DC
  Administered 2012-07-07 – 2012-07-11 (×5): 2 mg via ORAL
  Filled 2012-07-07 (×7): qty 1

## 2012-07-07 MED ORDER — HYDROXYZINE HCL 50 MG PO TABS
50.0000 mg | ORAL_TABLET | Freq: Every evening | ORAL | Status: DC | PRN
  Administered 2012-07-07 – 2012-07-10 (×4): 50 mg via ORAL
  Filled 2012-07-07 (×2): qty 1

## 2012-07-07 NOTE — Progress Notes (Signed)
Psychoeducational Group Note  Date:  07/07/2012 Time:  1100  Group Topic/Focus:  Relapse Prevention Planning:   The focus of this group is to define relapse and discuss the need for planning to combat relapse.  Participation Level:  Active  Participation Quality:  Appropriate  Affect:  Appropriate  Cognitive:  Appropriate  Insight:  Engaged  Engagement in Group:  Improving  Additional Comments:    Dalia Heading 07/07/2012, 12:59 PM

## 2012-07-07 NOTE — Progress Notes (Signed)
D: Patient pleasant and cooperative with staff. [name] mood/affect is sad and depressed. He reported on self inventory sheet that his appetite is good, energy level is normal, and ability to pay attention is good. Patient rated depression and feelings of hopelessness "5".  A: Support and encouragement provided to patient. Scheduled medications administered per MD orders. Monitor Q15 minute checks for safety.  R: Patient receptive. Denies SI/HI/AVH. Patient remains safe on the unit.

## 2012-07-07 NOTE — Clinical Social Work Note (Signed)
[facility] LCSW Group Therapy  07/07/2012  1:15 PM   Type of Therapy:  Group Therapy  Participation Level:  Active  Participation Quality:  Appropriate and Attentive  Affect:  Appropriate  Cognitive:  Alert and Appropriate  Insight:  Engaged  Engagement in Therapy:  Engaged  Modes of Intervention:  Clarification, Discussion, Exploration, Socialization and Support  Summary of Progress/Problems: The topic for today was feelings about relapse.  Pt discussed what relapse prevention is to them and identified triggers that they are on the path to relapse.  Pt processed their feeling towards relapse and was able to relate to peers.  Pt discussed coping skills that can be used for relapse prevention.  Pt discussed his relapse being with suicidal thoughts.  Pt states that he has a great support system but fails to use them when needed.     [name], [place] 07/07/2012 2:34 PM

## 2012-07-07 NOTE — Progress Notes (Signed)
[facility] Group Notes:  (Counselor/Nursing/MHT/Case Management/Adjunct)  07/07/2012 12:58 PM  Type of Therapy:  Psychoeducational Skills  Participation Level:  Minimal  Participation Quality:  Appropriate and Resistant  Affect:  Appropriate  Cognitive:  Appropriate  Insight:  Improving  Engagement in Group:  Improving  Modes of Intervention:  Activity   Dalia Heading 07/07/2012, 12:58 PM

## 2012-07-07 NOTE — Progress Notes (Signed)
Psychoeducational Group Note  Date:  07/07/2012 Time:  2000  Group Topic/Focus:  AA group  Participation Level:  Active  Participation Quality:  Attentive  Affect:  Appropriate  Cognitive:  Alert  Insight:  Engaged  Engagement in Group:  Engaged  Additional Comments:    Thatiana Renbarger R 07/07/2012, 9:11 PM
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
## 2012-07-07 NOTE — Tx Team (Signed)
Interdisciplinary Treatment Plan Update (Adult)  Date:  07/07/2012  Time Reviewed:  9:56 AM   Progress in Treatment: Attending groups: Yes Participating in groups:  Yes Taking medication as prescribed: Yes Tolerating medication:  Yes Family/Significant othe contact made:  CSW assessing for appropriate contact Patient understands diagnosis:  Yes Discussing patient identified problems/goals with staff:  Yes Medical problems stabilized or resolved:  Yes Denies suicidal/homicidal ideation: Yes Issues/concerns per patient self-inventory:  None identified Other: N/A  New problem(s) identified: None Identified  Reason for Continuation of Hospitalization: Anxiety Depression Medication stabilization Suicidal ideation  Interventions implemented related to continuation of hospitalization: mood stabilization, medication monitoring and adjustment, group therapy and psycho education, suicide risk assessment, collateral contact, aftercare planning, ongoing physician assessments and safety checks q 15 mins  Additional comments: N/A  Estimated length of stay: 3-5 days  Discharge Plan: CSW is assessing for appropriate referrals.  Pt wants long term treatment.    New goal(s): N/A  Review of initial/current patient goals per problem list:    1.  Goal(s): Address substance use by completing detox protocol  Met:  No  Target date: 4 days  As evidenced by: attending groups and actively participate  2.  Goal (s): Reduce depressive symptoms from a 10 to a 3  Met:  No  Target date: 3-5 days  As evidenced by: Pt rates at a 2 today - not congruent with affect.    3.  Goal (s): Reduce anxiety symptoms from a 10 to a 3  Met:  No  Target date:  3-5 days  As evidenced by: Pt rates at a 2 today - not congruent with affect.    4.  Goal(s): Eliminate SI  Met:  No  Target date: 3-5 days  As evidenced by: pt denying SI   Attendees: Patient:     Family:     Physician: Geoffery Lyons,  MD 07/07/2012 9:56 AM   Nursing: Burnetta Sabin, RN 07/07/2012 9:56 AM   Clinical Social Worker:  Reyes Ivan, LCSWA 07/07/2012  9:56 AM   Other: Seabron Spates, RN 07/07/2012  9:56 AM   Other:  Verna Czech, LCSW 07/07/2012 9:58 AM   Other: Serena Colonel, NP   07/07/2012 9:58 AM   Other:     Other:      Scribe for Treatment Team:   Reyes Ivan 07/07/2012 9:56 AM

## 2012-07-07 NOTE — Progress Notes (Signed)
Patient ID: [name], male   DOB: 04-01-1981, 31 y.o.   MRN: [id number] D: Pt. Eyes closed, resp. Even. A: Writer will assess for s/s of distress. Staff will monitor q24min for safety. R: Pt. Is safe on the unit. No distress noted.

## 2012-07-08 NOTE — Progress Notes (Signed)
Patient ID: [name], male   DOB: [birth year], 31 y.o.   MRN: [id number] D)  Was out in the dayroom this evening, rather quiet, but attended group.  Came to med window afterward, ready for his meds and to go to bed.  Stated had a long day.  Plans to go to a 30 day program and wants to get out of [place] and get a fresh start.  Denied feeling suicidal, wants to get his life back on track. A)  Was given support and praise for taking these steps, will continue to monitor q 15 minutes for safety, continue POC. R)  Receptive, appreciative, safety maintained.

## 2012-07-08 NOTE — Clinical Social Work Note (Signed)
[facility] Group Notes:  (Clinical Social Work)  07/08/2012  10:00-11:00AM  Summary of Progress/Problems:   The main focus of today's process group was for the patient to identify ways in which they have in the past sabotaged their own recovery and reasons they may have done this/what they received from doing it.  We then worked to identify a specific plan to avoid doing this when discharged from the hospital for this admission.  The patient expressed that his addiction is alcohol, and that he self-sabotages by telling himself he can drink just one drink.  He stated this may be true of the first day, then he tells himself he drank one successfully, so he could just drink 2.  This progresses rapidly.  He was able to understand the idea of practicing cognitive restructuring statements, and started this process in group.  Type of Therapy:  Group Therapy - Process  Participation Level:  Active  Participation Quality:  Attentive and Sharing  Affect:  Flat  Cognitive:  Alert, Appropriate and Oriented  Insight:  Engaged  Engagement in Therapy:  Engaged  Modes of Intervention:  Clarification, Education, Limit-setting, Problem-solving, Socialization, Support and Processing, Exploration, Discussion   Ambrose Mantle, LCSW 07/08/2012, 12:42 PM

## 2012-07-08 NOTE — Progress Notes (Signed)
Patient ID: [name], male   DOB: Jul 15, 1981, 31 y.o.   MRN: [id number] [facility] MD Progress Note  07/08/2012 12:48 PM [name]  MRN:  [id number]  Subjective:  Diagnosis: "I'm doing well. Everything is going well too. But, I'm still not able to sleep much.  I am willing to give Remeron and Hydroxyzine a chance to help me. I have no SIHI or hallucinations".  Axis I: Major Depression recurrent, PTSD, Substance Abuse Axis II: Deferred Axis III:  Past Medical History  Diagnosis Date  . Seizures   . Hypertension   . Depression   . Pancreatitis   . Liver cirrhosis   . Coronary artery disease   . Angina   . Shortness of breath   . Headache   . Peripheral vascular disease April 2011    Left Pop   Axis IV: occupational problems and problems related to legal system/crime Axis V: 51-60 moderate symptoms  ADL's:  Intact  Sleep: Poor  Appetite:  Fair  Suicidal Ideation:  Plan:  Denies Intent:  Denies Means:  Denies Homicidal Ideation:  Plan:  Denies Intent:  Denies Means:  Denies  AEB (as evidenced by): Per patient's reports.  Psychiatric Specialty Exam: Review of Systems  Constitutional: Negative.   HENT: Negative.   Eyes: Negative.   Respiratory: Negative.   Cardiovascular: Negative.   Gastrointestinal: Negative.   Genitourinary: Negative.   Musculoskeletal: Negative.   Skin: Negative.   Neurological: Negative.   Endo/Heme/Allergies: Negative.   Psychiatric/Behavioral: Positive for depression and substance abuse. The patient is nervous/anxious and has insomnia.     Blood pressure 120/77, pulse 87, temperature 97.3 F (36.3 C), temperature source Oral, resp. rate 16, height 5\' 6"  (1.676 m), weight 86.183 kg (190 lb).Body mass index is 30.67 kg/(m^2).  General Appearance: Fairly Groomed  Patent attorney::  Fair  Speech:  Clear and Coherent, Slow and not spontaneous  Volume:  Decreased  Mood:  Depressed  Affect:  Restricted  Thought Process:  Coherent and Goal  Directed  Orientation:  Full (Time, Place, and Person)  Thought Content:  worries, concerns  Suicidal Thoughts:  No  Homicidal Thoughts:  No  Memory:  Immediate;   Fair Recent;   Fair Remote;   Fair  Judgement:  Fair  Insight:  superficial  Psychomotor Activity:  Decreased  Concentration:  Fair  Recall:  Fair  Akathisia:  No  Handed:  Right  AIMS (if indicated):     Assets:  Desire for Improvement  Sleep:  Number of Hours: 5.75    Current Medications: Current Facility-Administered Medications  Medication Dose Route Frequency Provider Last Rate Last Dose  . acetaminophen (TYLENOL) tablet 650 mg  650 mg Oral Q6H PRN Kerry Hough, PA      . alum & mag hydroxide-simeth (MAALOX/MYLANTA) 200-200-20 MG/5ML suspension 30 mL  30 mL Oral Q4H PRN Kerry Hough, PA      . amLODipine (NORVASC) tablet 10 mg  10 mg Oral Daily Kerry Hough, PA   10 mg at 07/08/12 0756  . ARIPiprazole (ABILIFY) tablet 2 mg  2 mg Oral Daily Rachael Fee, MD   2 mg at 07/08/12 0756  . atorvastatin (LIPITOR) tablet 40 mg  40 mg Oral Daily Kerry Hough, PA   40 mg at 07/07/12 1709  . divalproex (DEPAKOTE ER) 24 hr tablet 500 mg  500 mg Oral BH-qamhs Kerry Hough, PA   500 mg at 07/08/12 0756  . DULoxetine (CYMBALTA) DR capsule  30 mg  30 mg Oral Daily Rachael Fee, MD   30 mg at 07/08/12 0756  . hydrOXYzine (ATARAX/VISTARIL) tablet 50 mg  50 mg Oral Q6H PRN Rachael Fee, MD   50 mg at 07/08/12 0758  . hydrOXYzine (ATARAX/VISTARIL) tablet 50 mg  50 mg Oral QHS PRN Rachael Fee, MD   50 mg at 07/07/12 2110  . magnesium hydroxide (MILK OF MAGNESIA) suspension 30 mL  30 mL Oral Daily PRN Kerry Hough, PA      . metoprolol succinate (TOPROL-XL) 24 hr tablet 50 mg  50 mg Oral Daily Kerry Hough, PA   50 mg at 07/08/12 0756  . mirtazapine (REMERON) tablet 45 mg  45 mg Oral QHS Rachael Fee, MD   45 mg at 07/07/12 2110  . nicotine (NICODERM CQ - dosed in mg/24 hours) patch 21 mg  21 mg Transdermal Q0600  Kerry Hough, PA   21 mg at 07/08/12 1610    Lab Results:  No results found for this or any previous visit (from the past 48 hour(s)).  Physical Findings: AIMS: Facial and Oral Movements Muscles of Facial Expression: None, normal Lips and Perioral Area: None, normal Jaw: None, normal Tongue: None, normal,Extremity Movements Upper (arms, wrists, hands, fingers): None, normal Lower (legs, knees, ankles, toes): None, normal, Trunk Movements Neck, shoulders, hips: None, normal, Overall Severity Severity of abnormal movements (highest score from questions above): None, normal Incapacitation due to abnormal movements: None, normal [name] awareness of abnormal movements (rate only patient's report): No Awareness, Dental Status Current problems with teeth and/or dentures?: No Does patient usually wear dentures?: No  CIWA:  CIWA-Ar Total: 0  COWS:  COWS Total Score: 0   Treatment Plan Summary: Daily contact with patient to assess and evaluate symptoms and progress in treatment Medication management  Plan: Supportive approach/coping skills/relapse prevention           No new changes made on the current treatment plan. Continue plan of care.  Medical Decision Making Problem Points:  Review of last therapy session (1) and Review of psycho-social stressors (1) Data Points:  Review of medication regiment & side effects (2)  I certify that inpatient services furnished can reasonably be expected to improve the patient's condition.   Armandina Stammer I 07/08/2012, 12:48 PM

## 2012-07-08 NOTE — Progress Notes (Signed)
Patient ID: [name], male   DOB: 11/13/1980, 31 y.o.   MRN: [id number] 07-08-12 nursing shift note; D: pt has taken his medication, gone to groups and meals today. He did complain of some anxiety this am. He denied any si/hi/av  A; rn administered a vistaril for the anxiety and it decreased the symptoms.  He had no other complaints. R: rn will monitor and q 15 min cks continue.

## 2012-07-08 NOTE — Progress Notes (Signed)
Patient did attend the evening speaker AA meeting.  

## 2012-07-09 MED ORDER — LISINOPRIL 10 MG PO TABS
10.0000 mg | ORAL_TABLET | Freq: Every day | ORAL | Status: DC
  Administered 2012-07-09: 10 mg via ORAL
  Filled 2012-07-09 (×3): qty 1

## 2012-07-09 MED ORDER — TRAZODONE HCL 50 MG PO TABS
150.0000 mg | ORAL_TABLET | Freq: Every evening | ORAL | Status: DC | PRN
  Administered 2012-07-09 – 2012-07-10 (×2): 150 mg via ORAL
  Filled 2012-07-09 (×2): qty 1

## 2012-07-09 NOTE — Progress Notes (Addendum)
Patient ID: Isa Kohlenberg, male   DOB: 03/29/1981, 31 y.o.   MRN: [id number] Patient ID: Dasan Hardman, male   DOB: [birth year], 31 y.o.   MRN: [id number] [facility] MD Progress Note  07/09/2012 2:25 PM Dayna Geurts  MRN:  [id number]  Subjective:  " Everything is going well too. I still am not sleeping at night. Trazodone has always helped me sleep. I feel okay today.".  Diagnosis: Axis I: Major Depression recurrent, PTSD, Substance Abuse Axis II: Deferred Axis III:  Past Medical History  Diagnosis Date  . Seizures   . Hypertension   . Depression   . Pancreatitis   . Liver cirrhosis   . Coronary artery disease   . Angina   . Shortness of breath   . Headache   . Peripheral vascular disease April 2011    Left Pop   Axis IV: occupational problems and problems related to legal system/crime Axis V: 51-60 moderate symptoms  ADL's:  Intact  Sleep: Poor  Appetite:  Fair  Suicidal Ideation:  Plan:  Denies Intent:  Denies Means:  Denies Homicidal Ideation:  Plan:  Denies Intent:  Denies Means:  Denies  AEB (as evidenced by): Per patient's reports.  Psychiatric Specialty Exam: Review of Systems  Constitutional: Negative.   HENT: Negative.   Eyes: Negative.   Respiratory: Negative.   Cardiovascular: Negative.   Gastrointestinal: Negative.   Genitourinary: Negative.   Musculoskeletal: Negative.   Skin: Negative.   Neurological: Negative.   Endo/Heme/Allergies: Negative.   Psychiatric/Behavioral: Positive for depression and substance abuse. The patient is nervous/anxious and has insomnia.     Blood pressure 138/103, pulse 74, temperature 95.9 F (35.5 C), temperature source Oral, resp. rate 16, height 5\' 6"  (1.676 m), weight 86.183 kg (190 lb).Body mass index is 30.67 kg/(m^2).  General Appearance: Fairly Groomed  Patent attorney::  Fair  Speech:  Clear and Coherent, Slow and not spontaneous  Volume:  Decreased  Mood:  Depressed  Affect:  Restricted  Thought Process:  Coherent  and Goal Directed  Orientation:  Full (Time, Place, and Person)  Thought Content:  worries, concerns  Suicidal Thoughts:  No  Homicidal Thoughts:  No  Memory:  Immediate;   Fair Recent;   Fair Remote;   Fair  Judgement:  Fair  Insight:  superficial  Psychomotor Activity:  Decreased  Concentration:  Fair  Recall:  Fair  Akathisia:  No  Handed:  Right  AIMS (if indicated):     Assets:  Desire for Improvement  Sleep:  Number of Hours: 3.75    Current Medications: Current Facility-Administered Medications  Medication Dose Route Frequency Provider Last Rate Last Dose  . acetaminophen (TYLENOL) tablet 650 mg  650 mg Oral Q6H PRN Kerry Hough, PA      . alum & mag hydroxide-simeth (MAALOX/MYLANTA) 200-200-20 MG/5ML suspension 30 mL  30 mL Oral Q4H PRN Kerry Hough, PA      . amLODipine (NORVASC) tablet 10 mg  10 mg Oral Daily Kerry Hough, PA   10 mg at 07/09/12 0752  . ARIPiprazole (ABILIFY) tablet 2 mg  2 mg Oral Daily Rachael Fee, MD   2 mg at 07/09/12 0865  . atorvastatin (LIPITOR) tablet 40 mg  40 mg Oral Daily Kerry Hough, PA   40 mg at 07/08/12 1701  . divalproex (DEPAKOTE ER) 24 hr tablet 500 mg  500 mg Oral BH-qamhs Kerry Hough, PA   500 mg at 07/09/12 7846  . DULoxetine (CYMBALTA)  DR capsule 30 mg  30 mg Oral Daily Rachael Fee, MD   30 mg at 07/09/12 9604  . hydrOXYzine (ATARAX/VISTARIL) tablet 50 mg  50 mg Oral Q6H PRN Rachael Fee, MD   50 mg at 07/08/12 0758  . hydrOXYzine (ATARAX/VISTARIL) tablet 50 mg  50 mg Oral QHS PRN Rachael Fee, MD   50 mg at 07/08/12 2111  . lisinopril (PRINIVIL,ZESTRIL) tablet 10 mg  10 mg Oral Daily Sanjuana Kava, NP      . magnesium hydroxide (MILK OF MAGNESIA) suspension 30 mL  30 mL Oral Daily PRN Kerry Hough, PA      . metoprolol succinate (TOPROL-XL) 24 hr tablet 50 mg  50 mg Oral Daily Kerry Hough, PA   50 mg at 07/09/12 0752  . mirtazapine (REMERON) tablet 45 mg  45 mg Oral QHS Rachael Fee, MD   45 mg at  07/08/12 2111  . nicotine (NICODERM CQ - dosed in mg/24 hours) patch 21 mg  21 mg Transdermal Q0600 Kerry Hough, PA   21 mg at 07/09/12 5409    Lab Results:  No results found for this or any previous visit (from the past 48 hour(s)).  Physical Findings: AIMS: Facial and Oral Movements Muscles of Facial Expression: None, normal Lips and Perioral Area: None, normal Jaw: None, normal Tongue: None, normal,Extremity Movements Upper (arms, wrists, hands, fingers): None, normal Lower (legs, knees, ankles, toes): None, normal, Trunk Movements Neck, shoulders, hips: None, normal, Overall Severity Severity of abnormal movements (highest score from questions above): None, normal Incapacitation due to abnormal movements: None, normal [name] awareness of abnormal movements (rate only patient's report): No Awareness, Dental Status Current problems with teeth and/or dentures?: No Does patient usually wear dentures?: No  CIWA:  CIWA-Ar Total: 0  COWS:  COWS Total Score: 0   Treatment Plan Summary: Daily contact with patient to assess and evaluate symptoms and progress in treatment Medication management  Plan: Supportive approach/coping skills/relapse prevention          See new changes made on the current treatment plan.           Lisinopril 10 mg x 74for increased BP.           Add Trazodone 150 mg Q hs prn for sleep.           Continue plan of care.  Medical Decision Making Problem Points:  Review of last therapy session (1) and Review of psycho-social stressors (1) Data Points:  Review of medication regiment & side effects (2)  I certify that inpatient services furnished can reasonably be expected to improve the patient's condition.   Armandina Stammer I 07/09/2012, 2:25 PM

## 2012-07-09 NOTE — Clinical Social Work Note (Signed)
[facility] Group Notes:  (Clinical Social Work)  07/09/2012  10:00-11:00AM  Summary of Progress/Problems:   The main focus of today's process group was for the patient to define "support" and describe what healthy supports are, then to identify the patient's current support system and decide on other supports that can be put in place to prevent future hospitalizations.   An emphasis was placed on using therapist, doctor and problem-specific support groups to expand supports.  The patient expressed that he has good supports including parents, grandparents and brothers, will continue to expand these per our discussion.  Type of Therapy:  Process Group  Participation Level:  Active  Participation Quality:  Appropriate, Attentive and Sharing  Affect:  Blunted  Cognitive:  Oriented  Insight:  Engaged  Engagement in Therapy:  Engaged  Modes of Intervention:  Clarification, Education, Limit-setting, Problem-solving, Socialization, Support and Processing, Exploration, Discussion, Role-Play   Ambrose Mantle, LCSW 07/09/2012, 12:11 PM

## 2012-07-09 NOTE — Progress Notes (Signed)
D.  Pt pleasant on approach, denies complaints.  Pleased to have trazodone added for sleep tonight, did not sleep well last night.  Denies SI/HI/hallucinations at this time.  Positive for evening AA group.  Interacting appropriately within milieu.  A.  Support and encouragement offered, medications given as ordered.  R.  Will continue to monitor.

## 2012-07-09 NOTE — Progress Notes (Signed)
Psychoeducational Group Note  Date:  07/09/2012 Time:  1515  Group Topic/Focus:  Conflict Resolution:   The focus of this group is to discuss the conflict resolution process and how it may be used upon discharge.  Participation Level:  Active  Participation Quality:  Appropriate, Attentive, Sharing and Supportive  Affect:  Appropriate  Cognitive:  Appropriate  Insight:  Engaged  Engagement in Group:  Engaged  Additional Comments:  None   Madelein Mahadeo A 07/09/2012, 6:56 PM

## 2012-07-09 NOTE — Progress Notes (Signed)
Psychoeducational Group Note  Date:  07/09/2012 Time:  1315pm  Group Topic/Focus:  Making Healthy Choices:   The focus of this group is to help patients identify negative/unhealthy choices they were using prior to admission and identify positive/healthier coping strategies to replace them upon discharge.  Participation Level:  Active  Participation Quality:  Appropriate  Affect:  Appropriate  Cognitive:  Appropriate  Insight:  Supportive  Engagement in Group:  Supportive  Additional Comments:  Jeannie rn did the group   [name] [name] 07/09/2012,2:02 PM

## 2012-07-09 NOTE — Progress Notes (Signed)
Patient ID: [name], male   DOB: Sep 01, 1980, 31 y.o.   MRN: [id number] D)  Was out and about on the hall this evening, was in dayroom watching tv, attended and participating in group.  Tens to be rather quiet, but interacts appropriately with staff and peers.  Still thinking about going to long term somewhere outside [place].   A)  Will continue to monitor q 15 minutes for safety, continue POC. R)  Receptive, safety maintained.

## 2012-07-09 NOTE — Progress Notes (Signed)
Patient ID: [name], male   DOB: 28-Dec-1980, 31 y.o.   MRN: [id number] 07-09-12 nursing shift note: D: pt has been isolative today. He did take his medications and go to the am group. He had no complaints. denied si/hi/av. A: continued to motivate and encourage pt. R: he has been responsive to the support and motivation. He has had no complaints.  rn will monitor and q 15 min cks continue.

## 2012-07-09 NOTE — Progress Notes (Signed)
Patient did attend the evening speaker AA meeting.  

## 2012-07-10 NOTE — Progress Notes (Signed)
Patient ID: Giann Obara, male   DOB: 30-Nov-1980, 31 y.o.   MRN: [id number] Patient ID: Judah Carchi, male   DOB: [birth year], 31 y.o.   MRN: [id number] Patient ID: Sidhant Helderman, male   DOB: Feb 02, 1981, 31 y.o.   MRN: [id number] [facility] MD Progress Note  07/10/2012 10:57 AM Carel Carrier  MRN:  [id number]  Subjective:  "I'm doing very well except that my stomach is hurting It must be from what I had for breakfast. I slept very well last night. My mood is good today. I think I'm ready to go home today and or tomorrow".  Diagnosis: Axis I: Major Depression recurrent, PTSD, Substance Abuse Axis II: Deferred Axis III:  Past Medical History  Diagnosis Date  . Seizures   . Hypertension   . Depression   . Pancreatitis   . Liver cirrhosis   . Coronary artery disease   . Angina   . Shortness of breath   . Headache   . Peripheral vascular disease April 2011    Left Pop   Axis IV: occupational problems and problems related to legal system/crime Axis V: 51-60 moderate symptoms  ADL's:  Intact  Sleep: Poor  Appetite:  Fair  Suicidal Ideation:  Plan:  Denies Intent:  Denies Means:  Denies Homicidal Ideation:  Plan:  Denies Intent:  Denies Means:  Denies  AEB (as evidenced by): Per patient's reports.  Psychiatric Specialty Exam: Review of Systems  Constitutional: Negative.   HENT: Negative.   Eyes: Negative.   Respiratory: Negative.   Cardiovascular: Negative.   Gastrointestinal: Positive for abdominal pain.  Genitourinary: Negative.   Musculoskeletal: Negative.   Skin: Negative.   Neurological: Negative.   Endo/Heme/Allergies: Negative.   Psychiatric/Behavioral: Positive for depression ( Currently being stabilized with medication) and substance abuse. The patient has insomnia. Nervous/anxious: Currently being stabilized with medication.     Blood pressure 107/71, pulse 85, temperature 98.3 F (36.8 C), temperature source Oral, resp. rate 16, height 5\' 6"  (1.676 m), weight  86.183 kg (190 lb).Body mass index is 30.67 kg/(m^2).  General Appearance: Fairly Groomed  Patent attorney::  Fair  Speech:  Clear and coherent  Volume:  Decreased  Mood:  Depressed  Affect:  Appropriate  Thought Process:  Coherent and Goal Directed  Orientation:  Full (Time, Place, and Person)  Thought Content:  Clear without hallucinations and or delusions.  Suicidal Thoughts:  No  Homicidal Thoughts:  No  Memory:  Immediate;   Fair Recent;   Fair Remote;   Fair  Judgement:  Fair  Insight:  superficial  Psychomotor Activity:  Within normal  Concentration:  Fair  Recall:  Fair  Akathisia:  No  Handed:  Right  AIMS (if indicated):     Assets:  Desire for Improvement  Sleep:  Number of Hours: 4.25    Current Medications: Current Facility-Administered Medications  Medication Dose Route Frequency Provider Last Rate Last Dose  . acetaminophen (TYLENOL) tablet 650 mg  650 mg Oral Q6H PRN Kerry Hough, PA      . alum & mag hydroxide-simeth (MAALOX/MYLANTA) 200-200-20 MG/5ML suspension 30 mL  30 mL Oral Q4H PRN Kerry Hough, PA      . amLODipine (NORVASC) tablet 10 mg  10 mg Oral Daily Sanjuana Kava, NP   10 mg at 07/10/12 0834  . ARIPiprazole (ABILIFY) tablet 2 mg  2 mg Oral Daily Rachael Fee, MD   2 mg at 07/10/12 0834  . atorvastatin (LIPITOR) tablet 40 mg  40 mg Oral Daily Kerry Hough, PA   40 mg at 07/09/12 1621  . divalproex (DEPAKOTE ER) 24 hr tablet 500 mg  500 mg Oral BH-qamhs Kerry Hough, PA   500 mg at 07/10/12 0834  . DULoxetine (CYMBALTA) DR capsule 30 mg  30 mg Oral Daily Rachael Fee, MD   30 mg at 07/10/12 0834  . hydrOXYzine (ATARAX/VISTARIL) tablet 50 mg  50 mg Oral Q6H PRN Rachael Fee, MD   50 mg at 07/10/12 0836  . hydrOXYzine (ATARAX/VISTARIL) tablet 50 mg  50 mg Oral QHS PRN Rachael Fee, MD   50 mg at 07/09/12 2117  . magnesium hydroxide (MILK OF MAGNESIA) suspension 30 mL  30 mL Oral Daily PRN Kerry Hough, PA      . metoprolol succinate  (TOPROL-XL) 24 hr tablet 50 mg  50 mg Oral Daily Kerry Hough, PA   50 mg at 07/10/12 0834  . mirtazapine (REMERON) tablet 45 mg  45 mg Oral QHS Rachael Fee, MD   45 mg at 07/09/12 2116  . nicotine (NICODERM CQ - dosed in mg/24 hours) patch 21 mg  21 mg Transdermal Q0600 Kerry Hough, PA   21 mg at 07/10/12 0600  . traZODone (DESYREL) tablet 150 mg  150 mg Oral QHS PRN Sanjuana Kava, NP   150 mg at 07/09/12 2116    Lab Results:  No results found for this or any previous visit (from the past 48 hour(s)).  Physical Findings: AIMS: Facial and Oral Movements Muscles of Facial Expression: None, normal Lips and Perioral Area: None, normal Jaw: None, normal Tongue: None, normal,Extremity Movements Upper (arms, wrists, hands, fingers): None, normal Lower (legs, knees, ankles, toes): None, normal, Trunk Movements Neck, shoulders, hips: None, normal, Overall Severity Severity of abnormal movements (highest score from questions above): None, normal Incapacitation due to abnormal movements: None, normal [name] awareness of abnormal movements (rate only patient's report): No Awareness, Dental Status Current problems with teeth and/or dentures?: No Does patient usually wear dentures?: No  CIWA:  CIWA-Ar Total: 0  COWS:  COWS Total Score: 0   Treatment Plan Summary: Daily contact with patient to assess and evaluate symptoms and progress in treatment Medication management  Plan: Supportive approach/coping skills/relapse prevention          No new changes made on the current treatment plan.          Continue current plan of care.  Medical Decision Making Problem Points:  Review of last therapy session (1) and Review of psycho-social stressors (1) Data Points:  Review of medication regiment & side effects (2)  I certify that inpatient services furnished can reasonably be expected to improve the patient's condition.   Armandina Stammer I 07/10/2012, 10:57 AM

## 2012-07-10 NOTE — Clinical Social Work Note (Signed)
[facility] LCSW Aftercare Discharge Planning Group Note  07/10/2012 8:45 AM  Participation Quality:  Did Not Attend   Floree Zuniga Horton, LCSWA 07/10/2012 9:16 AM   

## 2012-07-10 NOTE — Progress Notes (Signed)
D used meds to help his sleep last nite, but did not sleep well, not eating much, not going to DR for meals, energy level is normal, ability to pay attention is good, depressed 2/10 and hopeless 2/10, WD s/s include cravings, denies Si or Hi no AVH, patient has not attended group today d/t restless and sleepiness A q51min safety checks continue and support offered, encouraged him to go to group and participate, taking meds as ordered R patient remains safe on the unit

## 2012-07-10 NOTE — Progress Notes (Signed)
Pt reports he is doing better this evening.  He denies SI/HI/AV.  He denies any withdrawal symptoms.  He says he may be discharged tomorrow.  He plans to stay with his parents for the holidays, and then go for long term treatment.  He did not mention a particular facility.  Pt was encouraged to discuss this with his case manager as well as his MD considering the circumstances for his readmission.  Pt attended evening AA group.  Pt voices no other needs/concerns.  Safety maintained with q15 minute checks.

## 2012-07-10 NOTE — Clinical Social Work Note (Signed)
[facility] LCSW Group Therapy  07/10/2012  1:15 PM   Type of Therapy:  Group Therapy  Participation Level:  Did Not Attend group on overcoming obstacles  Lequan Dobratz Horton, LCSWA 07/10/2012 2:20 PM   

## 2012-07-10 NOTE — Tx Team (Addendum)
Interdisciplinary Treatment Plan Update (Adult)  Date:  07/10/2012  Time Reviewed:  9:53 AM   Progress in Treatment: Attending groups: Yes Participating in groups:  Yes Taking medication as prescribed: Yes Tolerating medication:  Yes Family/Significant othe contact made:  No, CSW assessing for appropriate contact Patient understands diagnosis:  Yes Discussing patient identified problems/goals with staff:  Yes Medical problems stabilized or resolved:  Yes Denies suicidal/homicidal ideation: Yes Issues/concerns per patient self-inventory:  None identified Other: N/A  New problem(s) identified: None Identified  Reason for Continuation of Hospitalization: Anxiety Depression Medication stabilization Withdrawal symptoms  Interventions implemented related to continuation of hospitalization: mood stabilization, medication monitoring and adjustment, group therapy and psycho education, suicide risk assessment, collateral contact, aftercare planning, ongoing physician assessments and safety checks q 15 mins  Additional comments: N/A  Estimated length of stay: 3-5 days  Discharge Plan: CSW is assessing for appropriate referrals.  Pt wants long term treatment.    New goal(s): N/A  Review of initial/current patient goals per problem list:    1.  Goal(s): Address substance use by completing detox protocol  Met:  No  Target date: 4 days  As evidenced by: completing detox protocol and stable  2.  Goal (s): Reduce depressive symptoms from a 10 to a 3  Met:  No  Target date: 3-5 days  As evidenced by: Pt rates at a   3.  Goal (s): Reduce anxiety symptoms from a 10 to a 3  Met:  No  Target date:  3-5 days  As evidenced by: Pt rates at a    Attendees: Patient:     Family:     Physician: Geoffery Lyons, MD 07/10/2012 9:53 AM   Nursing: Robbie Louis, RN 07/10/2012 9:53 AM   Clinical Social Worker:  Reyes Ivan, LCSWA 07/10/2012  9:53 AM   Other: Alease Frame, RN 07/10/2012   9:53 AM   Other:  Verna Czech, LCSW 07/10/2012 9:54 AM   Other:  Serena Colonel, NP 07/10/2012 9:54 AM   Other:     Other:      Scribe for Treatment Team:   Reyes Ivan 07/10/2012 9:53 AM

## 2012-07-10 NOTE — Progress Notes (Signed)
Psychoeducational Group Note  Date:  07/10/2012 Time:  1100 Group Topic/Focus:  Wellness Toolbox:   The focus of this group is to discuss various aspects of wellness, balancing those aspects and exploring ways to increase the ability to experience wellness.  Patients will create a wellness toolbox for use upon discharge.  Participation Level:  Did Not Attend  Additional Comments:  Pt has remained resting in bed during group.   Sharyn Lull 07/10/2012, 1:14 PM

## 2012-07-11 MED ORDER — HYDROXYZINE HCL 50 MG PO TABS: 50.0000 mg | ORAL_TABLET | Freq: Four times a day (QID) | ORAL | Status: DC | PRN

## 2012-07-11 MED ORDER — ARIPIPRAZOLE 2 MG PO TABS: 2.0000 mg | ORAL_TABLET | Freq: Every day | ORAL | Status: DC

## 2012-07-11 MED ORDER — TRAZODONE HCL 150 MG PO TABS: 150.0000 mg | ORAL_TABLET | Freq: Every evening | ORAL | Status: DC | PRN

## 2012-07-11 MED ORDER — DULOXETINE HCL 30 MG PO CPEP: 30.0000 mg | ORAL_CAPSULE | Freq: Every day | ORAL | Status: DC

## 2012-07-11 MED ORDER — TRAZODONE HCL 50 MG PO TABS
150.0000 mg | ORAL_TABLET | Freq: Every evening | ORAL | Status: DC | PRN
  Filled 2012-07-11: qty 9

## 2012-07-11 MED ORDER — HYDROXYZINE HCL 50 MG PO TABS: 50.0000 mg | ORAL_TABLET | Freq: Every evening | ORAL | Status: DC | PRN

## 2012-07-11 MED ORDER — MIRTAZAPINE 45 MG PO TABS: 45.0000 mg | ORAL_TABLET | Freq: Every day | ORAL | Status: DC

## 2012-07-11 NOTE — [facility] Suicide Risk Assessment (Signed)
Suicide Risk Assessment  Discharge Assessment     Demographic Factors:  Male and Unemployed  Mental Status Per Nursing Assessment::   On Admission:  Suicidal ideation indicated by patient  Current Mental Status by Physician: In full contact with reality. There are no suicidal ideas, plans or intent. His mood is euthymic, his affect is appropriate. He feels much better this time around. He has a better sense of a future. 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 He is committed to sobriety. He is going to stay with his parents. Has good support outside.   Loss Factors: Decrease in vocational status and Legal issues  Historical Factors: NA  Risk Reduction Factors:   Sense of responsibility to family, Living with another person, especially a relative, Positive social support and Positive coping skills or problem solving skills  Continued Clinical Symptoms:  Depression:   Comorbid alcohol abuse/dependence Alcohol/Substance Abuse/Dependencies  Cognitive Features That Contribute To Risk: None identified   Suicide Risk:  Minimal: No identifiable suicidal ideation.  Patients presenting with no risk factors but with morbid ruminations; may be classified as minimal risk based on the severity of the depressive symptoms  Discharge Diagnoses:   AXIS I:  Major Depression recurrent, PTSD  AXIS II:  Deferred AXIS III:   Past Medical History  Diagnosis Date  . Seizures   . Hypertension   . Depression   . Pancreatitis   . Liver cirrhosis   . Coronary artery disease   . Angina   . Shortness of breath   . Headache   . Peripheral vascular disease April 2011    Left Pop   AXIS IV:  occupational problems and problems related to legal system/crime AXIS V:  61-70 mild symptoms  Plan Of Care/Follow-up recommendations:  Activity:  As tolerated Diet:  Regular  Is patient on multiple antipsychotic therapies at discharge:  No   Has Patient had three or more  failed trials of antipsychotic monotherapy by history:  No  Recommended Plan for Multiple Antipsychotic Therapies: N/A   Emma Birchler A 07/11/2012, 11:19 AM

## 2012-07-11 NOTE — Clinical Social Work Note (Signed)
[facility] Adult Inpatient Family/Significant Other Suicide Prevention Education  Suicide Prevention Education:   Patient Refusal for Family/Significant Other Suicide Prevention Education: The patient has refused to provide written consent for family/significant other to be provided Family/Significant Other Suicide Prevention Education during admission and/or prior to discharge.  Physician notified.  CSW provided suicide prevention information with patient.    The suicide prevention education provided includes the following:  Suicide risk factors  Suicide prevention and interventions  National Suicide Hotline telephone number  [facility] assessment telephone number  [facility] Emergency Assistance 911  [facility] and/or Residential Mobile Crisis Unit telephone number   [place], [place] 07/11/2012 10:26 AM

## 2012-07-11 NOTE — Progress Notes (Signed)
[facility] Adult Case Management Discharge Plan :  Will you be returning to the same living situation after discharge: Yes,    At discharge, do you have transportation home?:Yes,    Do you have the ability to pay for your medications:Yes,     Release of information consent forms completed and in the chart;  [name] signature needed at discharge.  Patient to Follow up at: Follow-up Information    Follow up with [facility]. On 07/13/2012. (Walk-in appt scheduled for Thursday 07/13/12 at 8:00am)    Contact information:   [address] [place], [place] [address] [phone]         Patient denies SI/HI:   Yes,       Safety Planning and Suicide Prevention discussed:  Yes,     [name] 07/11/2012, 10:29 AM

## 2012-07-11 NOTE — Progress Notes (Signed)
DISCHARGE NOTE:  Discharged from hospital w/scripts called into [facility], 3 day supply of samples given to patient, appointment for [facility] on 07/13/12, denies Si or Hi, discharged to his parents. No problems at this time.

## 2012-07-17 NOTE — Progress Notes (Signed)
Patient Discharge Instructions:  After Visit Summary (AVS):   Faxed to:  07/17/12 Psychiatric Admission Assessment Note:   Faxed to:  07/17/12 Suicide Risk Assessment - Discharge Assessment:   Faxed to:  07/17/12 Faxed/Sent to the Next Level Care provider:  07/17/12 Faxed to [facility] @ [phone]  Jerelene Redden, 07/17/2012, 4:11 PM

## 2012-07-21 NOTE — Discharge Summary (Signed)
Physician Discharge Summary Note  Patient:  [name] is an 32 y.o., male MRN:  [id number] DOB:  [birth year] Patient phone:  [phone] (home)  Patient address:   [address] [place] [address],   Date of Admission:  07/05/2012 Date of Discharge: 07/11/2012  Reason for Admission: 32 Y/O male who has been dealing with a lot of stressful events. 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 He lost his place to stay. Was recently admitted and discharge from this unit. When he got home it hit him, got very depressed, took and overdose. He was re admitted  Discharge Diagnoses: Active Problems:  Major depressive disorder, recurrent episode  Anxiety disorder  PTSD (post-traumatic stress disorder)  Review of Systems  Constitutional: Negative.   HENT: Negative.   Eyes: Negative.   Respiratory: Negative.   Cardiovascular: Negative.   Gastrointestinal: Negative.   Genitourinary: Negative.   Musculoskeletal: Negative.   Skin: Negative.   Neurological: Negative.   Endo/Heme/Allergies: Negative.   Psychiatric/Behavioral: Positive for depression and substance abuse. The patient is nervous/anxious.    Axis Diagnosis:   AXIS I:  Major Depression recurrent, PTSD, Alcohol Dependence, Cocaine Abuse AXIS II:  Deferred AXIS III:   Past Medical History  Diagnosis Date  . Seizures   . Hypertension   . Depression   . Pancreatitis   . Liver cirrhosis   . Coronary artery disease   . Angina   . Shortness of breath   . Headache   . Peripheral vascular disease April 2011    Left Pop   AXIS IV:  economic problems, housing problems, occupational problems and problems with primary support group AXIS V:  61-70 mild symptoms  Level of Care:  OP  Hospital Course:  He was admitted, participated in individual and group therapy. He was initially isolated gradually became more involved. He worked on Pharmacologist. His  medications were changed. He expressed feeling better on this combination than on the previous one. He expressed readiness to be discharge. 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 His mood improved, his affect was brighter. There were no suicidal ideas, plans or intent  Consults:  None  Significant Diagnostic Studies:  labs: as per the chart  Discharge Vitals:   Blood pressure 120/86, pulse 76, temperature 98.1 F (36.7 C), temperature source Oral, resp. rate 16, height 5\' 6"  (1.676 m), weight 86.183 kg (190 lb). Body mass index is 30.67 kg/(m^2). Lab Results:   No results found for this or any previous visit (from the past 72 hour(s)).  Physical Findings: AIMS: Facial and Oral Movements Muscles of Facial Expression: None, normal Lips and Perioral Area: None, normal Jaw: None, normal Tongue: None, normal,Extremity Movements Upper (arms, wrists, hands, fingers): None, normal Lower (legs, knees, ankles, toes): None, normal, Trunk Movements Neck, shoulders, hips: None, normal, Overall Severity Severity of abnormal movements (highest score from questions above): None, normal Incapacitation due to abnormal movements: None, normal [name] awareness of abnormal movements (rate only patient's report): No Awareness, Dental Status Current problems with teeth and/or dentures?: No Does patient usually wear dentures?: No  CIWA:  CIWA-Ar Total: 0  COWS:  COWS Total Score: 0   Psychiatric Specialty Exam: See Psychiatric Specialty Exam and Suicide Risk Assessment completed by Attending Physician prior to discharge.  Discharge destination:  Home  Is  patient on multiple antipsychotic therapies at discharge:  No   Has Patient had three or more failed trials of antipsychotic monotherapy by history:  No  Recommended Plan for Multiple Antipsychotic Therapies: N/A   Discharge Orders    Future Appointments: Provider: Department: Dept Phone:  Center:   01/12/2013 3:00 PM [facility] [phone] [facility]   01/12/2013 3:30 PM [facility] [phone] [facility]   01/12/2013 4:00 PM Evern Bio, NP [facility] [phone] [facility]     Future Orders Please Complete By Expires   Diet - low sodium heart healthy      Increase activity slowly          Medication List     As of 07/21/2012  8:45 AM    STOP taking these medications         risperiDONE 3 MG tablet   Commonly known as: RISPERDAL      sertraline 50 MG tablet   Commonly known as: ZOLOFT      TAKE these medications      Indication    amLODipine 10 MG tablet   Commonly known as: NORVASC   Take 1 tablet (10 mg total) by mouth daily. For blood pressure    Indication: High Blood Pressure      ARIPiprazole 2 MG tablet   Commonly known as: ABILIFY   Take 1 tablet (2 mg total) by mouth daily. For mood stabilization    Indication: Major Depressive Disorder      atorvastatin 40 MG tablet   Commonly known as: LIPITOR   Take 1 tablet (40 mg total) by mouth daily. For high cholesterol    Indication: hyperlipidemia      divalproex 500 MG 24 hr tablet   Commonly known as: DEPAKOTE ER   Take 1 tablet (500 mg total) by mouth 2 (two) times daily in the am and at bedtime.. For mood    Indication: mood stabilization      DULoxetine 30 MG capsule   Commonly known as: CYMBALTA   Take 1 capsule (30 mg total) by mouth daily.    Indication: Major Depressive Disorder      hydrOXYzine 50 MG tablet   Commonly known as: ATARAX/VISTARIL   Take 1 tablet (50 mg total) by mouth every 6 (six) hours as needed for anxiety.       hydrOXYzine 50 MG tablet   Commonly known as: ATARAX/VISTARIL   Take 1 tablet (50 mg total) by mouth at bedtime as needed (insomnia).    Indication: insomnia      metoprolol succinate 50 MG 24 hr tablet   Commonly known as: TOPROL-XL   Take 1 tablet  (50 mg total) by mouth daily. For blood pressure    Indication: Feeling Anxious, High Blood Pressure      mirtazapine 45 MG tablet   Commonly known as: REMERON   Take 1 tablet (45 mg total) by mouth at bedtime. For sleep    Indication: Trouble Sleeping, Major Depressive Disorder      traZODone 150 MG tablet   Commonly known as: DESYREL   Take 1 tablet (150 mg total) by mouth at bedtime as needed. For sleep            Follow-up Information    Follow up with [facility]. On 07/13/2012. (Walk-in appt scheduled for Thursday 07/13/12 at 8:00am)    Contact information:   [address]  [place], [place] [address] [phone]         Follow-up recommendations:  Will go to [facility] for follow up                                                      Will resume his physical/occupational activities   Total Discharge Time:  More than 30 minutes  Signed: Shalae Belmonte A 07/21/2012, 8:45 AM

## 2012-07-22 ENCOUNTER — Encounter (HOSPITAL_COMMUNITY): Payer: Self-pay | Admitting: Nurse Practitioner

## 2012-07-22 ENCOUNTER — Emergency Department (HOSPITAL_COMMUNITY)
Admission: EM | Admit: 2012-07-22 | Discharge: 2012-07-22 | Disposition: A | Discharge status: Home / Self Care | Payer: Self-pay | Attending: Emergency Medicine | Admitting: Emergency Medicine

## 2012-07-22 DIAGNOSIS — E7800 Pure hypercholesterolemia, unspecified: Secondary | ICD-10-CM | POA: Insufficient documentation

## 2012-07-22 DIAGNOSIS — M62830 Muscle spasm of back: Secondary | ICD-10-CM

## 2012-07-22 DIAGNOSIS — I2510 Atherosclerotic heart disease of native coronary artery without angina pectoris: Secondary | ICD-10-CM | POA: Insufficient documentation

## 2012-07-22 DIAGNOSIS — Z79899 Other long term (current) drug therapy: Secondary | ICD-10-CM | POA: Insufficient documentation

## 2012-07-22 DIAGNOSIS — Z8709 Personal history of other diseases of the respiratory system: Secondary | ICD-10-CM | POA: Insufficient documentation

## 2012-07-22 DIAGNOSIS — F17200 Nicotine dependence, unspecified, uncomplicated: Secondary | ICD-10-CM | POA: Insufficient documentation

## 2012-07-22 DIAGNOSIS — I10 Essential (primary) hypertension: Secondary | ICD-10-CM | POA: Insufficient documentation

## 2012-07-22 DIAGNOSIS — Z8679 Personal history of other diseases of the circulatory system: Secondary | ICD-10-CM | POA: Insufficient documentation

## 2012-07-22 DIAGNOSIS — M5380 Other specified dorsopathies, site unspecified: Secondary | ICD-10-CM | POA: Insufficient documentation

## 2012-07-22 DIAGNOSIS — Z8669 Personal history of other diseases of the nervous system and sense organs: Secondary | ICD-10-CM | POA: Insufficient documentation

## 2012-07-22 DIAGNOSIS — Z8719 Personal history of other diseases of the digestive system: Secondary | ICD-10-CM | POA: Insufficient documentation

## 2012-07-22 DIAGNOSIS — M542 Cervicalgia: Secondary | ICD-10-CM | POA: Insufficient documentation

## 2012-07-22 HISTORY — DX: Pure hypercholesterolemia, unspecified: E78.00

## 2012-07-22 LAB — URINALYSIS, ROUTINE W REFLEX MICROSCOPIC
Bilirubin Urine: NEGATIVE
Glucose, UA: NEGATIVE mg/dL
Hgb urine dipstick: NEGATIVE
Specific Gravity, Urine: 1.005 (ref 1.005–1.030)
Urobilinogen, UA: 0.2 mg/dL (ref 0.0–1.0)

## 2012-07-22 MED ORDER — OSELTAMIVIR PHOSPHATE 75 MG PO CAPS
75.0000 mg | ORAL_CAPSULE | Freq: Once | ORAL | Status: AC
  Administered 2012-07-22: 75 mg via ORAL
  Filled 2012-07-22: qty 1

## 2012-07-22 MED ORDER — METAXALONE 800 MG PO TABS
400.0000 mg | ORAL_TABLET | Freq: Three times a day (TID) | ORAL | Status: DC
  Administered 2012-07-22: 400 mg via ORAL
  Filled 2012-07-22: qty 1

## 2012-07-22 MED ORDER — METAXALONE 400 MG HALF TABLET: 400.0000 mg | ORAL_TABLET | Freq: Three times a day (TID) | ORAL | Status: DC

## 2012-07-22 MED ORDER — KETOROLAC TROMETHAMINE 30 MG/ML IJ SOLN
30.0000 mg | Freq: Once | INTRAMUSCULAR | Status: AC
  Administered 2012-07-22: 30 mg via INTRAMUSCULAR
  Filled 2012-07-22: qty 1

## 2012-07-22 NOTE — ED Provider Notes (Signed)
History     CSN: [id number]  Arrival date & time 07/22/12  1953   None     Chief Complaint  Patient presents with  . Back Pain    (Consider location/radiation/quality/duration/timing/severity/associated sxs/prior treatment) HPI Comments: Slept n the floor 5 night ago now having R lower back pain that is transient worse with certain movements Has not taken any medications for discomfort   The history is provided by the patient.    Past Medical History  Diagnosis Date  . Seizures   . Hypertension   . Depression   . Pancreatitis   . Liver cirrhosis   . Coronary artery disease   . Angina   . Shortness of breath   . Headache   . Peripheral vascular disease April 2011    Left Pop  . Hypercholesteremia     Past Surgical History  Procedure Date  . Chest surgery   . Left leg surgery   . Mastectomy     Family History  Problem Relation Age of Onset  . Stroke Other     History  Substance Use Topics  . Smoking status: Current Every Day Smoker -- 1.0 packs/day for 4 years    Types: Cigarettes  . Smokeless tobacco: Never Used  . Alcohol Use: 1.8 oz/week    1 Glasses of wine, 2 Cans of beer per week     Comment: pint daily      Review of Systems  Constitutional: Negative for fever and chills.  HENT: Positive for neck pain.   Eyes: Negative.   Respiratory: Negative for shortness of breath.   Cardiovascular: Negative for chest pain.  Gastrointestinal: Negative.   Genitourinary: Negative for dysuria, frequency and flank pain.  Musculoskeletal: Positive for back pain. Negative for joint swelling and gait problem.  Skin: Negative for rash and wound.    Allergies  Morphine and Penicillins  Home Medications   Current Outpatient Rx  Name  Route  Sig  Dispense  Refill  . AMLODIPINE BESYLATE 10 MG PO TABS   Oral   Take 1 tablet (10 mg total) by mouth daily. For blood pressure   30 tablet   0   . ARIPIPRAZOLE 2 MG PO TABS   Oral   Take 1 tablet (2 mg total)  by mouth daily. For mood stabilization   30 tablet   0   . ATORVASTATIN CALCIUM 40 MG PO TABS   Oral   Take 1 tablet (40 mg total) by mouth daily. For high cholesterol         . DIVALPROEX SODIUM ER 500 MG PO TB24   Oral   Take 1 tablet (500 mg total) by mouth 2 (two) times daily in the am and at bedtime.. For mood   90 tablet   0   . DULOXETINE HCL 30 MG PO CPEP   Oral   Take 1 capsule (30 mg total) by mouth daily.   30 capsule   0     For depression and anxiety   . HYDROXYZINE HCL 50 MG PO TABS   Oral   Take 1 tablet (50 mg total) by mouth every 6 (six) hours as needed for anxiety.   30 tablet   0   . HYDROXYZINE HCL 50 MG PO TABS   Oral   Take 1 tablet (50 mg total) by mouth at bedtime as needed (insomnia).   30 tablet   0   . METOPROLOL SUCCINATE ER 50 MG PO TB24  Oral   Take 1 tablet (50 mg total) by mouth daily. For blood pressure   30 tablet   0   . MIRTAZAPINE 45 MG PO TABS   Oral   Take 1 tablet (45 mg total) by mouth at bedtime. For sleep   30 tablet   0   . TRAZODONE HCL 150 MG PO TABS   Oral   Take 1 tablet (150 mg total) by mouth at bedtime as needed. For sleep   30 tablet   0     BP 115/78  Pulse 84  Temp 98.5 F (36.9 C) (Oral)  Resp 15  SpO2 96%  Physical Exam  Constitutional: He appears well-developed and well-nourished.  Eyes: Pupils are equal, round, and reactive to light.  Neck: Normal range of motion.  Cardiovascular: Normal rate.   Pulmonary/Chest: Effort normal.  Musculoskeletal: Normal range of motion. He exhibits tenderness. He exhibits no edema.       Lumbar back: He exhibits tenderness and pain.       Arms: Neurological: He is alert.  Skin: Skin is warm.    ED Course  Procedures (including critical care time)   Labs Reviewed  URINALYSIS, ROUTINE W REFLEX MICROSCOPIC   No results found.   1. Muscle spasm of back       MDM  Muscle spasm         Arman Filter, NP 07/22/12 2201  Arman Filter, NP 07/22/12 2209

## 2012-07-22 NOTE — ED Notes (Signed)
Pt c/o R sided back pain since sleeping on floor 5 nights ago. Pt is ambulatory, MAE. Reports urinary frequency also.

## 2012-07-23 NOTE — ED Provider Notes (Signed)
Medical screening examination/treatment/procedure(s) were performed by non-physician practitioner and as supervising physician I was immediately available for consultation/collaboration.  Julius Matus T Charnise Lovan, MD 07/23/12 1744 

## 2012-08-10 ENCOUNTER — Encounter (HOSPITAL_BASED_OUTPATIENT_CLINIC_OR_DEPARTMENT_OTHER): Payer: Self-pay | Admitting: Family Medicine

## 2012-08-10 ENCOUNTER — Emergency Department (HOSPITAL_BASED_OUTPATIENT_CLINIC_OR_DEPARTMENT_OTHER): Payer: Self-pay

## 2012-08-10 ENCOUNTER — Emergency Department (HOSPITAL_BASED_OUTPATIENT_CLINIC_OR_DEPARTMENT_OTHER)
Admission: EM | Admit: 2012-08-10 | Discharge: 2012-08-10 | Disposition: A | Discharge status: Home / Self Care | Payer: Self-pay | Attending: Emergency Medicine | Admitting: Emergency Medicine

## 2012-08-10 DIAGNOSIS — Z8679 Personal history of other diseases of the circulatory system: Secondary | ICD-10-CM | POA: Insufficient documentation

## 2012-08-10 DIAGNOSIS — I2510 Atherosclerotic heart disease of native coronary artery without angina pectoris: Secondary | ICD-10-CM | POA: Insufficient documentation

## 2012-08-10 DIAGNOSIS — R42 Dizziness and giddiness: Secondary | ICD-10-CM | POA: Insufficient documentation

## 2012-08-10 DIAGNOSIS — Z8719 Personal history of other diseases of the digestive system: Secondary | ICD-10-CM | POA: Insufficient documentation

## 2012-08-10 DIAGNOSIS — R05 Cough: Secondary | ICD-10-CM | POA: Insufficient documentation

## 2012-08-10 DIAGNOSIS — F329 Major depressive disorder, single episode, unspecified: Secondary | ICD-10-CM | POA: Insufficient documentation

## 2012-08-10 DIAGNOSIS — J101 Influenza due to other identified influenza virus with other respiratory manifestations: Secondary | ICD-10-CM | POA: Insufficient documentation

## 2012-08-10 DIAGNOSIS — E7800 Pure hypercholesterolemia, unspecified: Secondary | ICD-10-CM | POA: Insufficient documentation

## 2012-08-10 DIAGNOSIS — I739 Peripheral vascular disease, unspecified: Secondary | ICD-10-CM | POA: Insufficient documentation

## 2012-08-10 DIAGNOSIS — I10 Essential (primary) hypertension: Secondary | ICD-10-CM | POA: Insufficient documentation

## 2012-08-10 DIAGNOSIS — R059 Cough, unspecified: Secondary | ICD-10-CM | POA: Insufficient documentation

## 2012-08-10 DIAGNOSIS — R51 Headache: Secondary | ICD-10-CM | POA: Insufficient documentation

## 2012-08-10 DIAGNOSIS — F17200 Nicotine dependence, unspecified, uncomplicated: Secondary | ICD-10-CM | POA: Insufficient documentation

## 2012-08-10 DIAGNOSIS — Z79899 Other long term (current) drug therapy: Secondary | ICD-10-CM | POA: Insufficient documentation

## 2012-08-10 DIAGNOSIS — R109 Unspecified abdominal pain: Secondary | ICD-10-CM | POA: Insufficient documentation

## 2012-08-10 DIAGNOSIS — F3289 Other specified depressive episodes: Secondary | ICD-10-CM | POA: Insufficient documentation

## 2012-08-10 DIAGNOSIS — R0789 Other chest pain: Secondary | ICD-10-CM | POA: Insufficient documentation

## 2012-08-10 LAB — COMPREHENSIVE METABOLIC PANEL
BUN: 11 mg/dL (ref 6–23)
CO2: 27 mEq/L (ref 19–32)
Calcium: 9.5 mg/dL (ref 8.4–10.5)
Creatinine, Ser: 1.1 mg/dL (ref 0.50–1.35)
GFR calc Af Amer: >90 mL/min (ref >90)
GFR calc non Af Amer: 87 mL/min — ABNORMAL LOW (ref >90)
Glucose, Bld: 112 mg/dL — ABNORMAL HIGH (ref 70–99)

## 2012-08-10 LAB — URINALYSIS, ROUTINE W REFLEX MICROSCOPIC
Nitrite: NEGATIVE
Protein, ur: 30 mg/dL — AB
Specific Gravity, Urine: 1.026 (ref 1.005–1.030)
Urobilinogen, UA: 1 mg/dL (ref 0.0–1.0)

## 2012-08-10 LAB — CBC WITH DIFFERENTIAL/PLATELET
Eosinophils Relative: 0 % (ref 0–5)
HCT: 42 % (ref 39.0–52.0)
Lymphocytes Relative: 6 % — ABNORMAL LOW (ref 12–46)
Lymphs Abs: 0.4 10*3/uL — ABNORMAL LOW (ref 0.7–4.0)
MCV: 82.7 fL (ref 78.0–100.0)
Monocytes Absolute: 0.5 10*3/uL (ref 0.1–1.0)
Monocytes Relative: 7 % (ref 3–12)
RDW: 13 % (ref 11.5–15.5)
WBC: 6.7 10*3/uL (ref 4.0–10.5)

## 2012-08-10 LAB — LIPASE, BLOOD: Lipase: 11 U/L (ref 11–59)

## 2012-08-10 LAB — TROPONIN I: Troponin I: <0.3 ng/mL (ref <0.30)

## 2012-08-10 LAB — URINE MICROSCOPIC-ADD ON

## 2012-08-10 MED ORDER — SODIUM CHLORIDE 0.9 % IV BOLUS (SEPSIS)
1000.0000 mL | Freq: Once | INTRAVENOUS | Status: AC
  Administered 2012-08-10: 1000 mL via INTRAVENOUS

## 2012-08-10 MED ORDER — PROMETHAZINE HCL 25 MG PO TABS: 25.0000 mg | ORAL_TABLET | Freq: Four times a day (QID) | ORAL | Status: DC | PRN

## 2012-08-10 MED ORDER — ONDANSETRON HCL 4 MG/2ML IJ SOLN
4.0000 mg | Freq: Once | INTRAMUSCULAR | Status: AC
  Administered 2012-08-10: 4 mg via INTRAVENOUS
  Filled 2012-08-10: qty 2

## 2012-08-10 MED ORDER — IBUPROFEN 800 MG PO TABS: 800.0000 mg | ORAL_TABLET | Freq: Three times a day (TID) | ORAL | Status: DC

## 2012-08-10 MED ORDER — KETOROLAC TROMETHAMINE 30 MG/ML IJ SOLN
30.0000 mg | Freq: Once | INTRAMUSCULAR | Status: AC
  Administered 2012-08-10: 30 mg via INTRAVENOUS
  Filled 2012-08-10: qty 1

## 2012-08-10 NOTE — ED Notes (Addendum)
Pt c/o vomiting, dizziness and chest tightness, h/a x 2 days. Pt is currently in rehab at [facility].

## 2012-08-10 NOTE — ED Notes (Signed)
Patient changed into gown.

## 2012-08-10 NOTE — ED Provider Notes (Signed)
History     CSN: [id number]  Arrival date & time 08/10/12  1128   First MD Initiated Contact with Patient 08/10/12 1141      Chief Complaint  Patient presents with  . Emesis    (Consider location/radiation/quality/duration/timing/severity/associated sxs/prior treatment) HPI Comments: Patient comes to the ER for evaluation of flulike symptoms. Patient reports that he became ill yesterday. He has headache, dizziness, sinus congestion, cough. Patient also reports that he has had nausea and vomiting all day yesterday and today. He says he cannot hold anything down. Patient reports that the cough is causing to feel short of breath and he has mild to moderate tightness in his chest.  Patient is a 32 y.o. male presenting with vomiting.  Emesis  Associated symptoms include abdominal pain, cough and headaches. Pertinent negatives include no fever.    Past Medical History  Diagnosis Date  . Seizures   . Hypertension   . Depression   . Pancreatitis   . Liver cirrhosis   . Coronary artery disease   . Angina   . Shortness of breath   . Headache   . Peripheral vascular disease April 2011    Left Pop  . Hypercholesteremia     Past Surgical History  Procedure Date  . Chest surgery   . Left leg surgery   . Mastectomy     Family History  Problem Relation Age of Onset  . Stroke Other     History  Substance Use Topics  . Smoking status: Current Every Day Smoker -- 1.0 packs/day for 4 years    Types: Cigarettes  . Smokeless tobacco: Never Used  . Alcohol Use: 1.8 oz/week    1 Glasses of wine, 2 Cans of beer per week     Comment: pint daily      Review of Systems  Constitutional: Negative for fever.  Respiratory: Positive for cough.   Cardiovascular: Positive for chest pain.  Gastrointestinal: Positive for nausea, vomiting and abdominal pain.  Neurological: Positive for dizziness and headaches.  All other systems reviewed and are negative.    Allergies  Morphine and  Penicillins  Home Medications   Current Outpatient Rx  Name  Route  Sig  Dispense  Refill  . AMLODIPINE BESYLATE 10 MG PO TABS   Oral   Take 1 tablet (10 mg total) by mouth daily. For blood pressure   30 tablet   0   . ARIPIPRAZOLE 2 MG PO TABS   Oral   Take 1 tablet (2 mg total) by mouth daily. For mood stabilization   30 tablet   0   . ATORVASTATIN CALCIUM 40 MG PO TABS   Oral   Take 1 tablet (40 mg total) by mouth daily. For high cholesterol         . DIVALPROEX SODIUM ER 500 MG PO TB24   Oral   Take 1 tablet (500 mg total) by mouth 2 (two) times daily in the am and at bedtime.. For mood   90 tablet   0   . DULOXETINE HCL 30 MG PO CPEP   Oral   Take 1 capsule (30 mg total) by mouth daily.   30 capsule   0     For depression and anxiety   . HYDROXYZINE HCL 50 MG PO TABS   Oral   Take 1 tablet (50 mg total) by mouth every 6 (six) hours as needed for anxiety.   30 tablet   0   . HYDROXYZINE  HCL 50 MG PO TABS   Oral   Take 1 tablet (50 mg total) by mouth at bedtime as needed (insomnia).   30 tablet   0   . METAXALONE 400 MG HALF TABLET   Oral   Take 0.5 tablets (400 mg total) by mouth 3 (three) times daily.   7 tablet   0   . METOPROLOL SUCCINATE ER 50 MG PO TB24   Oral   Take 1 tablet (50 mg total) by mouth daily. For blood pressure   30 tablet   0   . MIRTAZAPINE 45 MG PO TABS   Oral   Take 1 tablet (45 mg total) by mouth at bedtime. For sleep   30 tablet   0   . TRAZODONE HCL 150 MG PO TABS   Oral   Take 1 tablet (150 mg total) by mouth at bedtime as needed. For sleep   30 tablet   0     There were no vitals taken for this visit.  Physical Exam  Constitutional: He is oriented to person, place, and time. He appears well-developed and well-nourished. No distress.  HENT:  Head: Normocephalic and atraumatic.  Right Ear: Hearing normal.  Nose: Nose normal.  Mouth/Throat: Oropharynx is clear and moist and mucous membranes are normal.    Eyes: Conjunctivae normal and EOM are normal. Pupils are equal, round, and reactive to light.  Neck: Normal range of motion. Neck supple.  Cardiovascular: Normal rate, regular rhythm, S1 normal and S2 normal.  Exam reveals no gallop and no friction rub.   No murmur heard. Pulmonary/Chest: Effort normal and breath sounds normal. No respiratory distress. He exhibits no tenderness.  Abdominal: Soft. Normal appearance and bowel sounds are normal. There is no hepatosplenomegaly. There is generalized tenderness. There is no rebound, no guarding, no tenderness at McBurney's point and negative Murphy's sign. No hernia.  Musculoskeletal: Normal range of motion.  Neurological: He is alert and oriented to person, place, and time. He has normal strength. No cranial nerve deficit or sensory deficit. Coordination normal. GCS eye subscore is 4. GCS verbal subscore is 5. GCS motor subscore is 6.  Skin: Skin is warm, dry and intact. No rash noted. No cyanosis.  Psychiatric: He has a normal mood and affect. His speech is normal and behavior is normal. Thought content normal.    ED Course  Procedures (including critical care time)   Date: 08/10/2012  Rate: 92  Rhythm: normal sinus rhythm  QRS Axis: normal  Intervals: normal  ST/T Wave abnormalities: nonspecific T wave changes  Conduction Disutrbances:none  Narrative Interpretation:   Old EKG Reviewed: unchanged    Labs Reviewed  CBC WITH DIFFERENTIAL - Abnormal; Notable for the following:    Neutrophils Relative 87 (*)     Lymphocytes Relative 6 (*)     Lymphs Abs 0.4 (*)     All other components within normal limits  COMPREHENSIVE METABOLIC PANEL - Abnormal; Notable for the following:    Glucose, Bld 112 (*)     GFR calc non Af Amer 87 (*)     All other components within normal limits  URINALYSIS, ROUTINE W REFLEX MICROSCOPIC - Abnormal; Notable for the following:    Color, Urine AMBER (*)  BIOCHEMICALS MAY BE AFFECTED BY COLOR   APPearance  CLOUDY (*)     Bilirubin Urine SMALL (*)     Ketones, ur 15 (*)     Protein, ur 30 (*)     All other  components within normal limits  URINE MICROSCOPIC-ADD ON - Abnormal; Notable for the following:    Bacteria, UA FEW (*)     All other components within normal limits  LIPASE, BLOOD  TROPONIN I   Dg Chest 2 View  08/10/2012  *RADIOLOGY REPORT*  Clinical Data: Cough, fever  CHEST - 2 VIEW  Comparison: 07/05/2012  Findings: Cardiomediastinal silhouette is stable.  No acute infiltrate or pleural effusion.  No pulmonary edema.  Bony thorax is unremarkable. Linear atelectasis or scarring in the lingula.  IMPRESSION: No active disease.   Original Report Authenticated By: Natasha Mead, M.D.      Diagnosis: Influenza    MDM  Patient comes to the ER with complaints of viral illness type symptoms consistent with influenza. [name] constellation of symptoms include upper respiratory infections consisting of nasal congestion and cough as well as GI symptoms consisting of nausea and vomiting. He had mild diffuse abdominal tenderness, but no focality and no signs of acute surgical process. Lab work is entirely normal. Between the patient's records reveals that he has history of pancreatitis, liver cirrhosis and heart disease. Cardiac workup was negative. LFTs were normal. Lipase was normal. Patient was hydrated here in the ER and medicated. He will be discharged with continued outpatient treatment for flulike illness including Tamiflu and symptomatic relief.        Gilda Crease, MD 08/10/12 1258

## 2012-08-21 IMAGING — CT CT ABD-PELV W/O CM
2 of 4 series · 17 of 46 positions shown, 19 images · non-contrast
Comparison: 10/23/2009

CLINICAL DATA: Hematuria

CT ABDOMEN AND PELVIS WITHOUT CONTRAST
TECHNIQUE: Multidetector CT imaging of the abdomen and pelvis was
performed following the standard protocol without intravenous
contrast.

[Series 2: stone under 200# w/ prev · axial · 0.64mm/px · z∈[-441,-86]mm · 14 of 77 slices shown, 16 images]
[im 3/77  soft-tissue]
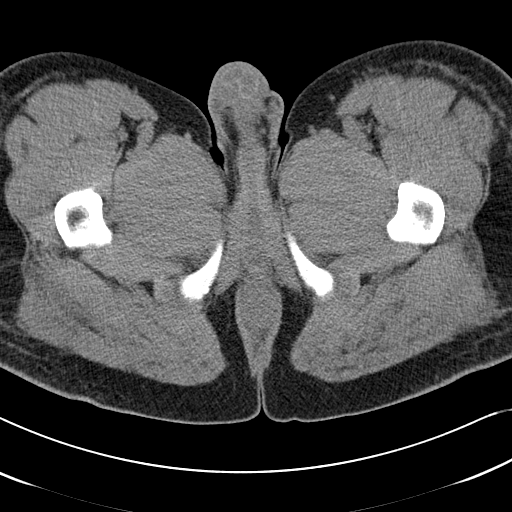
[im 3/77  bone]
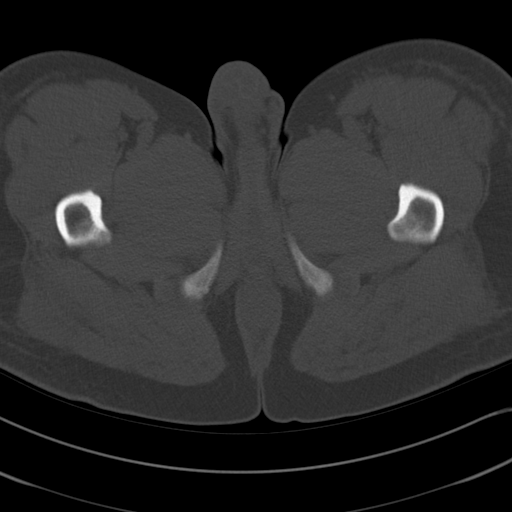
[im 9/77  soft-tissue]
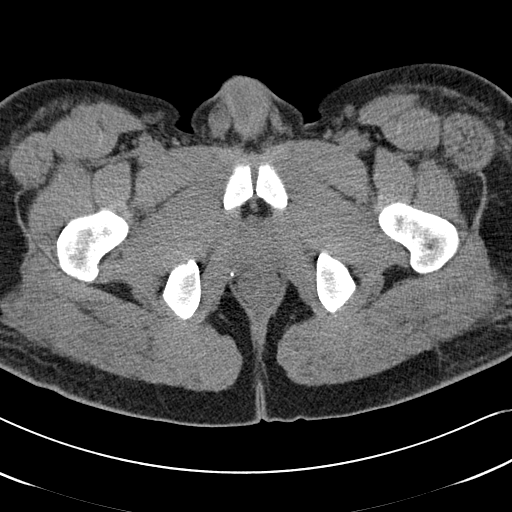
[im 15/77  soft-tissue]
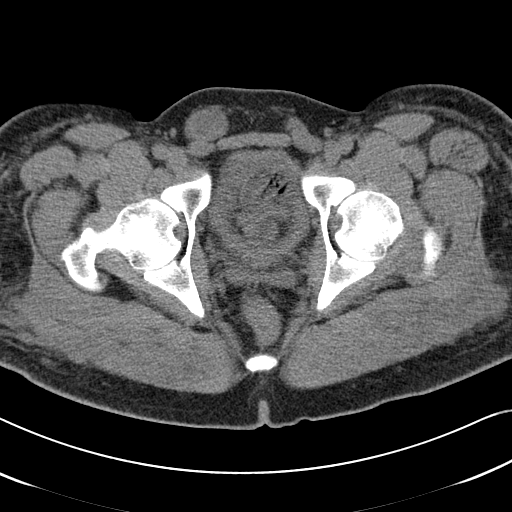
[im 21/77  soft-tissue]
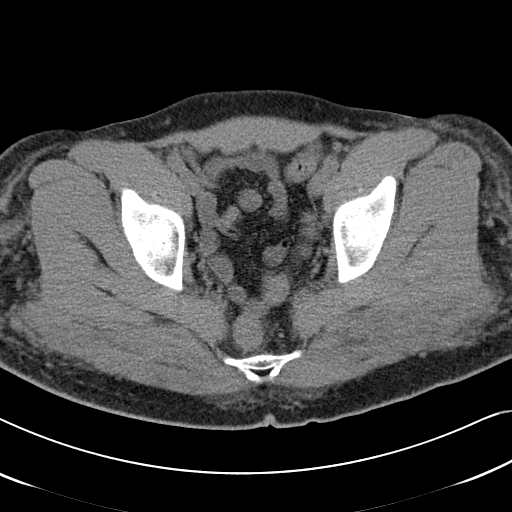
[im 27/77  soft-tissue]
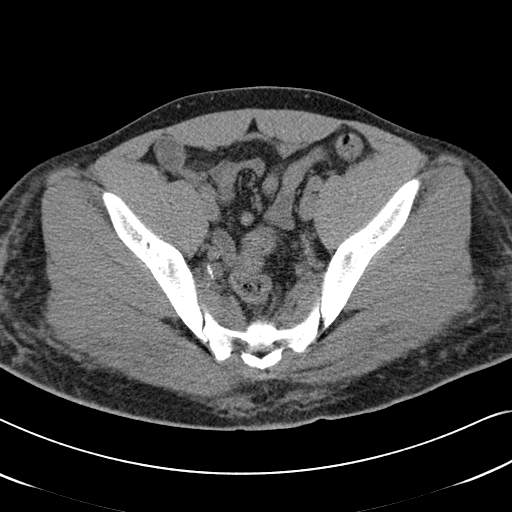
[im 30/77  soft-tissue]
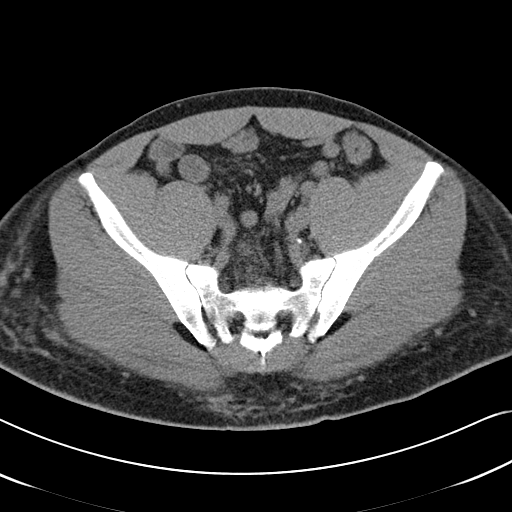
[im 36/77  soft-tissue]
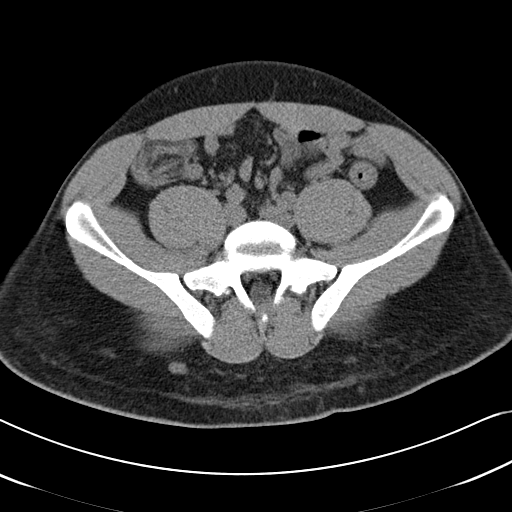
[im 41/77  soft-tissue]
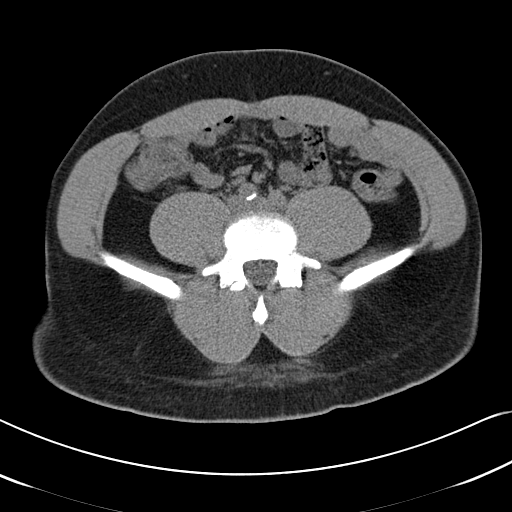
[im 47/77  soft-tissue]
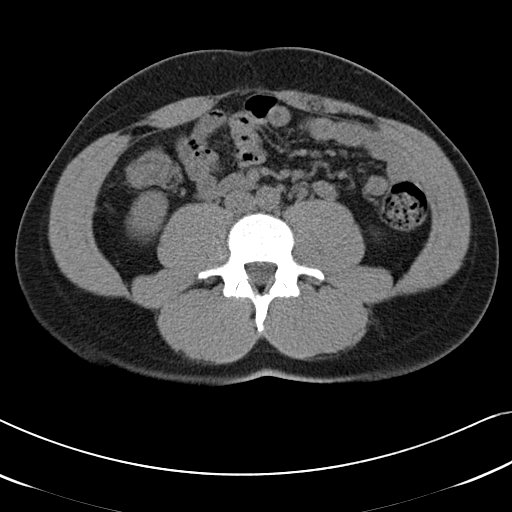
[im 47/77  bone]
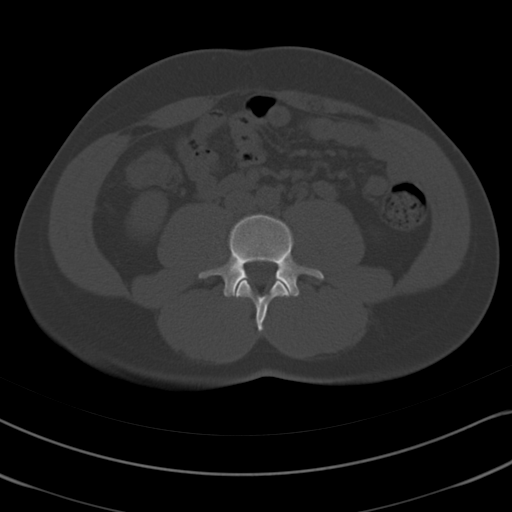
[im 50/77  soft-tissue]
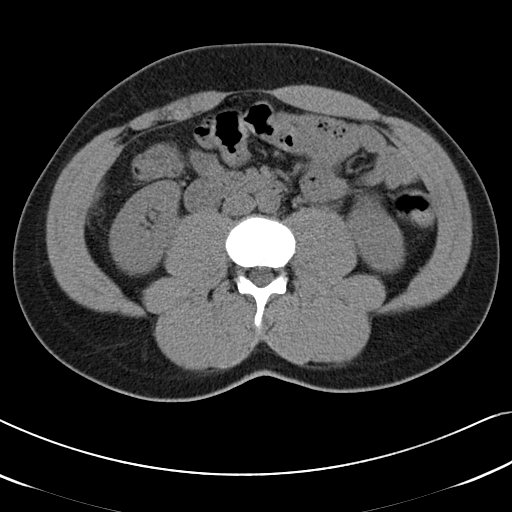
[im 56/77  soft-tissue]
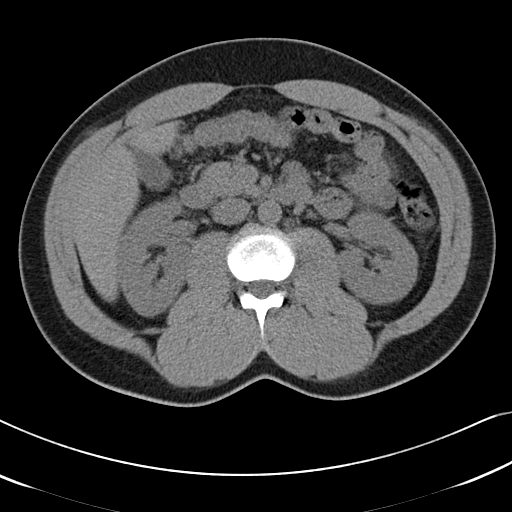
[im 62/77  soft-tissue]
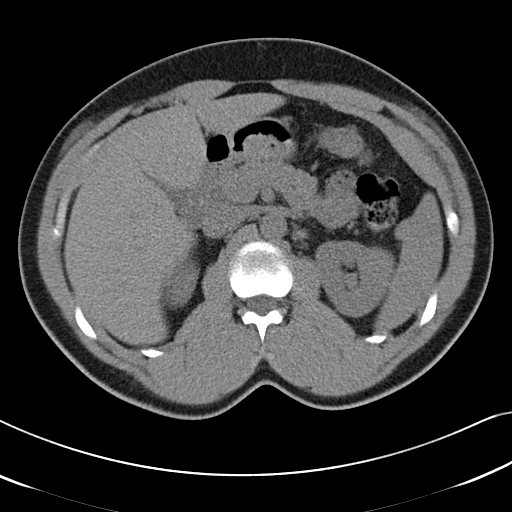
[im 68/77  soft-tissue]
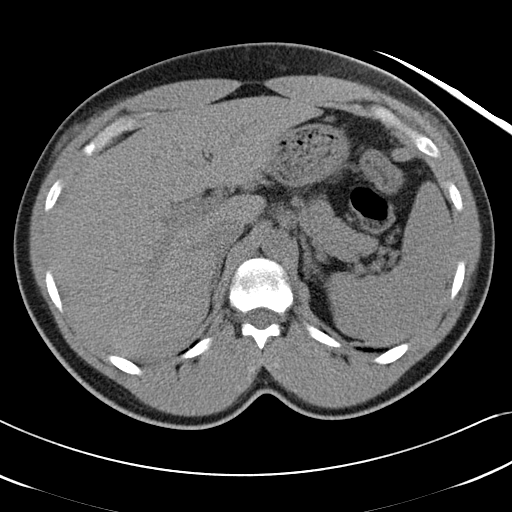
[im 74/77  soft-tissue]
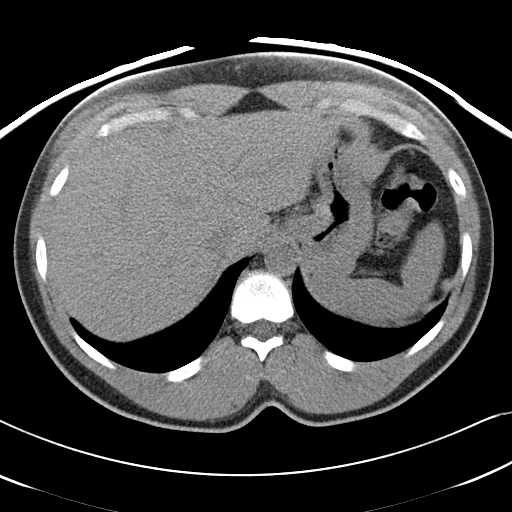

[Series 602: coronal images · coronal · 0.79mm/px · 3 of 76 slices shown]
[im 26/76  soft-tissue]
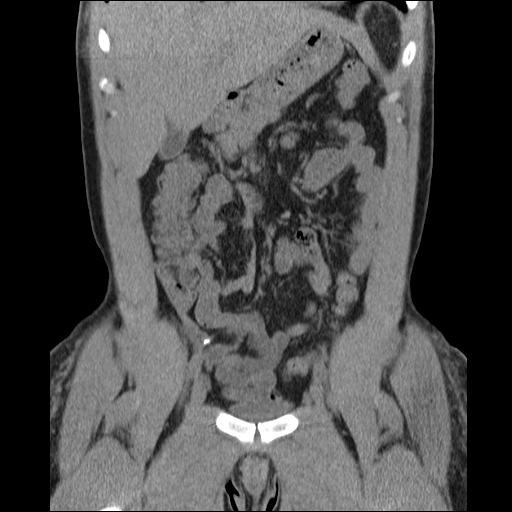
[im 34/76  soft-tissue]
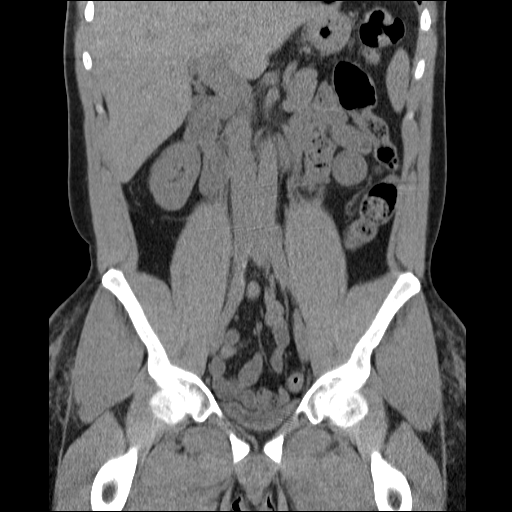
[im 42/76  soft-tissue]
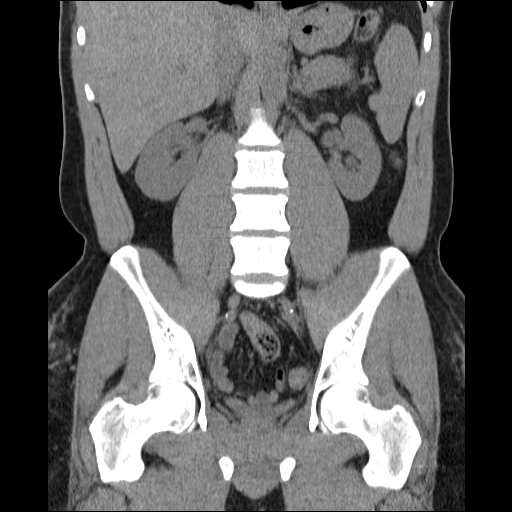

[17 of 46 positions shown; findings below may reference images not displayed]

FINDINGS: The lung bases are clear.  The visualized portion of the
liver, spleen, adrenal glands and pancreas are unremarkable.  The
gallbladder is also within normal limits.

An exophytic mass is again seen radiating from the lower pole left
kidney measuring approximately 2.6 cm.  The kidneys are otherwise
normal.  No stones are seen.  There is no hydronephrosis. A right
inguinal hernia is seen, fluid-filled.  No bowel is seen in the
hernia sac.

There is no small bowel obstruction.  The terminal ileum is normal
appearance.  Postsurgical changes of appendectomy are seen.  The
colon is decompressed.

No aggressive lytic or blastic bone lesions are seen. Several
nodules are seen the subcutaneous tissues of the buttocks lateral
hips, likely related to injections.
IMPRESSION: 1.  No stones identified on today's exam.  There is an exophytic
mass in the lower pole of the left kidney with which demonstrated
enhancement on the previous examination.  Again, this remains
concerning for renal cell carcinoma until proven otherwise.
2.  Right inguinal hernia.

## 2012-09-21 ENCOUNTER — Encounter (HOSPITAL_COMMUNITY): Payer: Self-pay

## 2012-09-21 ENCOUNTER — Emergency Department (HOSPITAL_COMMUNITY)
Admission: EM | Admit: 2012-09-21 | Discharge: 2012-09-21 | Disposition: A | Discharge status: Home / Self Care | Payer: Self-pay | Attending: Emergency Medicine | Admitting: Emergency Medicine

## 2012-09-21 DIAGNOSIS — I2510 Atherosclerotic heart disease of native coronary artery without angina pectoris: Secondary | ICD-10-CM | POA: Insufficient documentation

## 2012-09-21 DIAGNOSIS — R197 Diarrhea, unspecified: Secondary | ICD-10-CM | POA: Insufficient documentation

## 2012-09-21 DIAGNOSIS — F17200 Nicotine dependence, unspecified, uncomplicated: Secondary | ICD-10-CM | POA: Insufficient documentation

## 2012-09-21 DIAGNOSIS — Y929 Unspecified place or not applicable: Secondary | ICD-10-CM | POA: Insufficient documentation

## 2012-09-21 DIAGNOSIS — K7030 Alcoholic cirrhosis of liver without ascites: Secondary | ICD-10-CM | POA: Insufficient documentation

## 2012-09-21 DIAGNOSIS — R109 Unspecified abdominal pain: Secondary | ICD-10-CM | POA: Insufficient documentation

## 2012-09-21 DIAGNOSIS — Z8659 Personal history of other mental and behavioral disorders: Secondary | ICD-10-CM | POA: Insufficient documentation

## 2012-09-21 DIAGNOSIS — Z8719 Personal history of other diseases of the digestive system: Secondary | ICD-10-CM | POA: Insufficient documentation

## 2012-09-21 DIAGNOSIS — Z8709 Personal history of other diseases of the respiratory system: Secondary | ICD-10-CM | POA: Insufficient documentation

## 2012-09-21 DIAGNOSIS — T510X4A Toxic effect of ethanol, undetermined, initial encounter: Secondary | ICD-10-CM | POA: Insufficient documentation

## 2012-09-21 DIAGNOSIS — T5191XA Toxic effect of unspecified alcohol, accidental (unintentional), initial encounter: Secondary | ICD-10-CM

## 2012-09-21 DIAGNOSIS — R079 Chest pain, unspecified: Secondary | ICD-10-CM | POA: Insufficient documentation

## 2012-09-21 DIAGNOSIS — Z5189 Encounter for other specified aftercare: Secondary | ICD-10-CM | POA: Insufficient documentation

## 2012-09-21 DIAGNOSIS — Y939 Activity, unspecified: Secondary | ICD-10-CM | POA: Insufficient documentation

## 2012-09-21 DIAGNOSIS — R42 Dizziness and giddiness: Secondary | ICD-10-CM | POA: Insufficient documentation

## 2012-09-21 DIAGNOSIS — I739 Peripheral vascular disease, unspecified: Secondary | ICD-10-CM | POA: Insufficient documentation

## 2012-09-21 DIAGNOSIS — G40909 Epilepsy, unspecified, not intractable, without status epilepticus: Secondary | ICD-10-CM | POA: Insufficient documentation

## 2012-09-21 DIAGNOSIS — Z8679 Personal history of other diseases of the circulatory system: Secondary | ICD-10-CM | POA: Insufficient documentation

## 2012-09-21 DIAGNOSIS — T510X1A Toxic effect of ethanol, accidental (unintentional), initial encounter: Secondary | ICD-10-CM | POA: Insufficient documentation

## 2012-09-21 DIAGNOSIS — I10 Essential (primary) hypertension: Secondary | ICD-10-CM | POA: Insufficient documentation

## 2012-09-21 DIAGNOSIS — R112 Nausea with vomiting, unspecified: Secondary | ICD-10-CM | POA: Insufficient documentation

## 2012-09-21 DIAGNOSIS — E7800 Pure hypercholesterolemia, unspecified: Secondary | ICD-10-CM | POA: Insufficient documentation

## 2012-09-21 LAB — CBC WITH DIFFERENTIAL/PLATELET
Eosinophils Absolute: 0 10*3/uL (ref 0.0–0.7)
Hemoglobin: 15.9 g/dL (ref 13.0–17.0)
Lymphocytes Relative: 9 % — ABNORMAL LOW (ref 12–46)
Lymphs Abs: 1.2 10*3/uL (ref 0.7–4.0)
MCH: 29.3 pg (ref 26.0–34.0)
MCV: 80.7 fL (ref 78.0–100.0)
Monocytes Relative: 6 % (ref 3–12)
Neutrophils Relative %: 85 % — ABNORMAL HIGH (ref 43–77)
RBC: 5.43 MIL/uL (ref 4.22–5.81)
WBC: 13.2 10*3/uL — ABNORMAL HIGH (ref 4.0–10.5)

## 2012-09-21 LAB — LIPASE, BLOOD: Lipase: 15 U/L (ref 11–59)

## 2012-09-21 LAB — COMPREHENSIVE METABOLIC PANEL
AST: 32 U/L (ref 0–37)
Albumin: 4.3 g/dL (ref 3.5–5.2)
Calcium: 9.9 mg/dL (ref 8.4–10.5)
Chloride: 104 mEq/L (ref 96–112)
Creatinine, Ser: 0.78 mg/dL (ref 0.50–1.35)
Sodium: 145 mEq/L (ref 135–145)

## 2012-09-21 MED ORDER — PROMETHAZINE HCL 25 MG PO TABS
25.0000 mg | ORAL_TABLET | Freq: Once | ORAL | Status: AC
  Administered 2012-09-21: 25 mg via ORAL
  Filled 2012-09-21: qty 1

## 2012-09-21 MED ORDER — SODIUM CHLORIDE 0.9 % IV BOLUS (SEPSIS)
1000.0000 mL | Freq: Once | INTRAVENOUS | Status: AC
  Administered 2012-09-21: 1000 mL via INTRAVENOUS

## 2012-09-21 MED ORDER — PROMETHAZINE HCL 25 MG PO TABS: 25.0000 mg | ORAL_TABLET | Freq: Four times a day (QID) | ORAL | Status: DC | PRN

## 2012-09-21 MED ORDER — ONDANSETRON 4 MG PO TBDP
4.0000 mg | ORAL_TABLET | Freq: Once | ORAL | Status: AC
  Administered 2012-09-21: 4 mg via ORAL
  Filled 2012-09-21: qty 1

## 2012-09-21 MED ORDER — ONDANSETRON HCL 4 MG/2ML IJ SOLN
4.0000 mg | Freq: Once | INTRAMUSCULAR | Status: AC
  Administered 2012-09-21: 4 mg via INTRAVENOUS
  Filled 2012-09-21: qty 2

## 2012-09-21 MED ORDER — FENTANYL CITRATE 0.05 MG/ML IJ SOLN
50.0000 ug | Freq: Once | INTRAMUSCULAR | Status: AC
  Administered 2012-09-21: 50 ug via INTRAVENOUS
  Filled 2012-09-21: qty 2

## 2012-09-21 MED ORDER — LORAZEPAM 2 MG/ML IJ SOLN
1.0000 mg | Freq: Once | INTRAMUSCULAR | Status: AC
  Administered 2012-09-21: 1 mg via INTRAVENOUS
  Filled 2012-09-21: qty 1

## 2012-09-21 MED ORDER — GI COCKTAIL ~~LOC~~
30.0000 mL | Freq: Once | ORAL | Status: AC
  Administered 2012-09-21: 30 mL via ORAL
  Filled 2012-09-21: qty 30

## 2012-09-21 NOTE — ED Notes (Signed)
Pt presents with nausea and vomiting after drinking 3 bottles of wine early this morning.  Pt reports abdominal pain, points to umbilicus as to location.  +diarrhea.  Pt states "I think I have alcohol poisoning".  Pt reports losing consciousness after drinking wine, unsure is he ingested anything else.

## 2012-09-21 NOTE — ED Provider Notes (Signed)
History     CSN: [id number]  Arrival date & time 09/21/12  1215   First MD Initiated Contact with Patient 09/21/12 1430      Chief Complaint  Patient presents with  . Emesis    (Consider location/radiation/quality/duration/timing/severity/associated sxs/prior treatment) HPI [name] is a 32 y.o. male who presents to ED with complaint of nausea, vomiting, diarrhea, abdominal pain. 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 States drank 3 bottles of wine between 3am and 7am this morning. States now feeling nauseated, having abdominal cramping, vomiting, loose stools, and states "everything is spinning."  States abdominal pain is diffuse, cramping. Stats also having pain in chest with coughing and vomiting.  Past Medical History  Diagnosis Date  . Seizures   . Hypertension   . Depression   . Pancreatitis   . Liver cirrhosis   . Coronary artery disease   . Angina   . Shortness of breath   . Headache   . Peripheral vascular disease April 2011    Left Pop  . Hypercholesteremia     Past Surgical History  Procedure Laterality Date  . Chest surgery    . Left leg surgery    . Mastectomy      Family History  Problem Relation Age of Onset  . Stroke Other     History  Substance Use Topics  . Smoking status: Current Every Day Smoker -- 1.00 packs/day for 4 years    Types: Cigarettes  . Smokeless tobacco: Never Used  . Alcohol Use: 1.8 oz/week    1 Glasses of wine, 2 Cans of beer per week     Comment: pint daily      Review of Systems  Constitutional: Negative for fever and chills.  HENT: Negative for neck pain and neck stiffness.   Respiratory: Negative.   Cardiovascular: Positive for chest pain. Negative for palpitations and leg swelling.  Gastrointestinal: Positive for nausea, vomiting, abdominal pain and diarrhea. Negative for blood in stool.  Genitourinary: Negative.   Musculoskeletal: Negative.   Skin:  Negative.   Neurological: Positive for dizziness, light-headedness and headaches. Negative for weakness.  All other systems reviewed and are negative.    Allergies  Morphine and Penicillins  Home Medications   Current Outpatient Rx  Name  Route  Sig  Dispense  Refill  . amLODipine (NORVASC) 10 MG tablet   Oral   Take 1 tablet (10 mg total) by mouth daily. For blood pressure   30 tablet   0   . ARIPiprazole (ABILIFY) 2 MG tablet   Oral   Take 1 tablet (2 mg total) by mouth daily. For mood stabilization   30 tablet   0   . atorvastatin (LIPITOR) 40 MG tablet   Oral   Take 1 tablet (40 mg total) by mouth daily. For high cholesterol         . DULoxetine (CYMBALTA) 30 MG capsule   Oral   Take 1 capsule (30 mg total) by mouth daily.   30 capsule   0     For depression and anxiety   . hydrOXYzine (ATARAX/VISTARIL) 50 MG tablet   Oral   Take 1 tablet (50 mg total) by mouth every 6 (six) hours as needed for anxiety.   30 tablet   0   . traZODone (DESYREL) 150 MG tablet   Oral   Take 1 tablet (150 mg total) by mouth at  bedtime as needed. For sleep   30 tablet   0     BP 111/78  Pulse 101  Temp(Src) 97.9 F (36.6 C) (Oral)  Resp 16  SpO2 100%  Physical Exam  Nursing note and vitals reviewed. Constitutional: He is oriented to person, place, and time. He appears well-developed and well-nourished. No distress.  HENT:  Head: Normocephalic.  Eyes: Conjunctivae are normal.  Neck: Neck supple.  Cardiovascular: Normal rate, regular rhythm and normal heart sounds.   Pulmonary/Chest: Effort normal and breath sounds normal. No respiratory distress. He has no wheezes. He has no rales.  Abdominal: Soft. Bowel sounds are normal. He exhibits no distension. There is tenderness. There is no rebound and no guarding.  Diffuse tenderenss  Musculoskeletal: He exhibits no edema.  Neurological: He is alert and oriented to person, place, and time.  Skin: Skin is warm and dry.     ED Course  Procedures (including critical care time)  PT with n/v/d abdominal pain after drinking 3 bottles of moscato wine.  Results for orders placed during the hospital encounter of 09/21/12  CBC WITH DIFFERENTIAL      Result Value Range   WBC 13.2 (*) 4.0 - 10.5 K/uL   RBC 5.43  4.22 - 5.81 MIL/uL   Hemoglobin 15.9  13.0 - 17.0 g/dL   HCT 16.1  09.6 - 04.5 %   MCV 80.7  78.0 - 100.0 fL   MCH 29.3  26.0 - 34.0 pg   MCHC 36.3 (*) 30.0 - 36.0 g/dL   RDW 40.9  81.1 - 91.4 %   Platelets 221  150 - 400 K/uL   Neutrophils Relative 85 (*) 43 - 77 %   Neutro Abs 11.2 (*) 1.7 - 7.7 K/uL   Lymphocytes Relative 9 (*) 12 - 46 %   Lymphs Abs 1.2  0.7 - 4.0 K/uL   Monocytes Relative 6  3 - 12 %   Monocytes Absolute 0.8  0.1 - 1.0 K/uL   Eosinophils Relative 0  0 - 5 %   Eosinophils Absolute 0.0  0.0 - 0.7 K/uL   Basophils Relative 0  0 - 1 %   Basophils Absolute 0.0  0.0 - 0.1 K/uL  COMPREHENSIVE METABOLIC PANEL      Result Value Range   Sodium 145  135 - 145 mEq/L   Potassium 3.9  3.5 - 5.1 mEq/L   Chloride 104  96 - 112 mEq/L   CO2 21  19 - 32 mEq/L   Glucose, Bld 123 (*) 70 - 99 mg/dL   BUN 9  6 - 23 mg/dL   Creatinine, Ser 7.82  0.50 - 1.35 mg/dL   Calcium 9.9  8.4 - 95.6 mg/dL   Total Protein 7.6  6.0 - 8.3 g/dL   Albumin 4.3  3.5 - 5.2 g/dL   AST 32  0 - 37 U/L   ALT 27  0 - 53 U/L   Alkaline Phosphatase 82  39 - 117 U/L   Total Bilirubin 0.5  0.3 - 1.2 mg/dL   GFR calc non Af Amer >90  >90 mL/min   GFR calc Af Amer >90  >90 mL/min  ETHANOL      Result Value Range   Alcohol, Ethyl (B) <11  0 - 11 mg/dL  LIPASE, BLOOD      Result Value Range   Lipase 15  11 - 59 U/L   No results found.  5:07 PM  pt feeling better. Tolerating PO  fluids still stats head is spinning. Will discharge home.     1. Nausea & vomiting   2. Alcohol poisoning, initial encounter   3. Abdominal pain       MDM  Pt with nausea, vomiting, weakness, dizziness, after drinking 3  bottles of moscato wine early this morning. Pt states he is recovering alcoholic. Pt's labs unremarkable other than mildly elevated WBC. Abdomen diffusely tender, no guarding. He was rehydrated with IVF, zofran and phenergan for nausea. VS are normal. He is tolerating PO fluids. Pt dc home in stable condition with follow up.          Lottie Mussel, PA-C 09/23/12 0740

## 2012-09-21 NOTE — ED Notes (Addendum)
Pt with multiple episodes of vomiting after drinking gatorade in the waiting room. Pt medicated with zofran ODT and explained delay for room. Instructed not to continue drinking while waiting to be seen for the MD.

## 2012-09-27 IMAGING — US US ABDOMEN COMPLETE
1 series · 14 of 25 positions shown · non-contrast
Comparison: CT abdomen and pelvis 03/20/2010.

CLINICAL DATA: Medical clearance.

COMPLETE ABDOMINAL ULTRASOUND

[Series 1: us abdomen complete · 0.26mm/px · 14 of 69 slices shown]
[im 1/69]
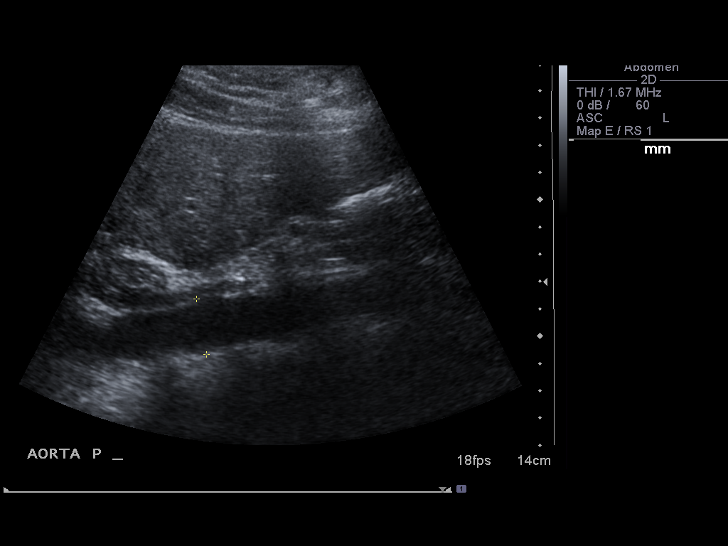
[im 6/69]
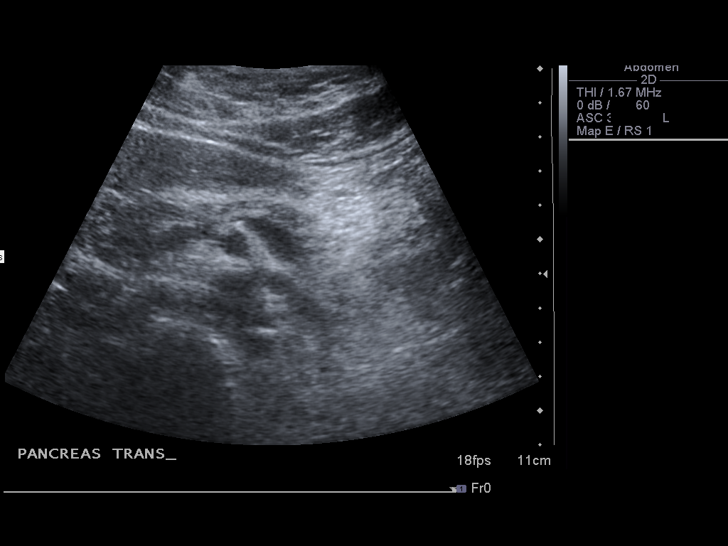
[im 12/69]
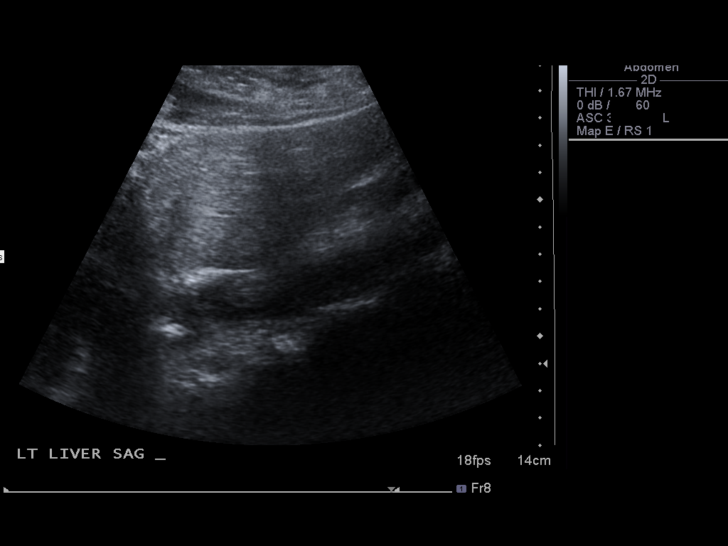
[im 18/69]
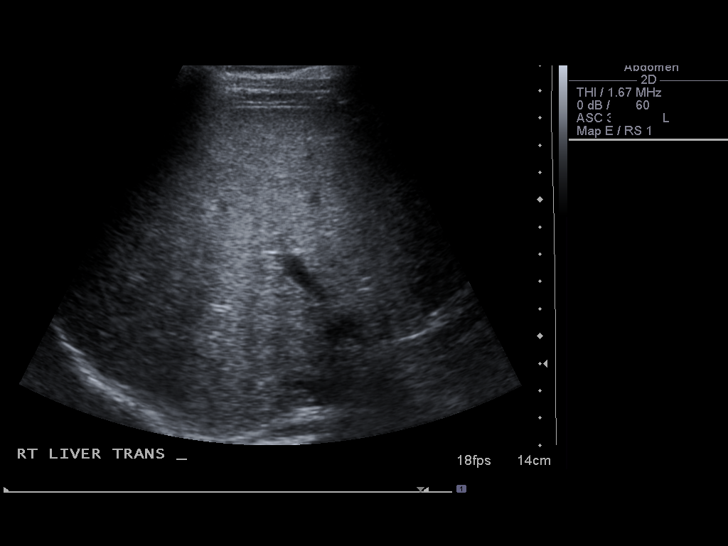
[im 23/69]
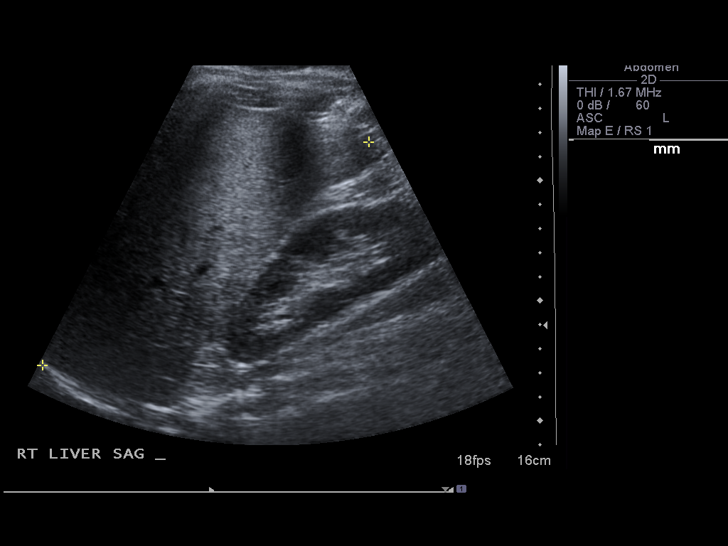
[im 26/69]
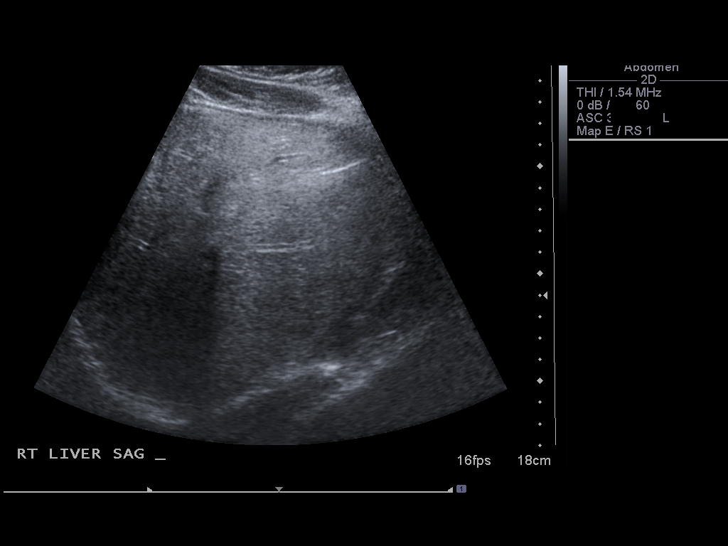
[im 32/69]
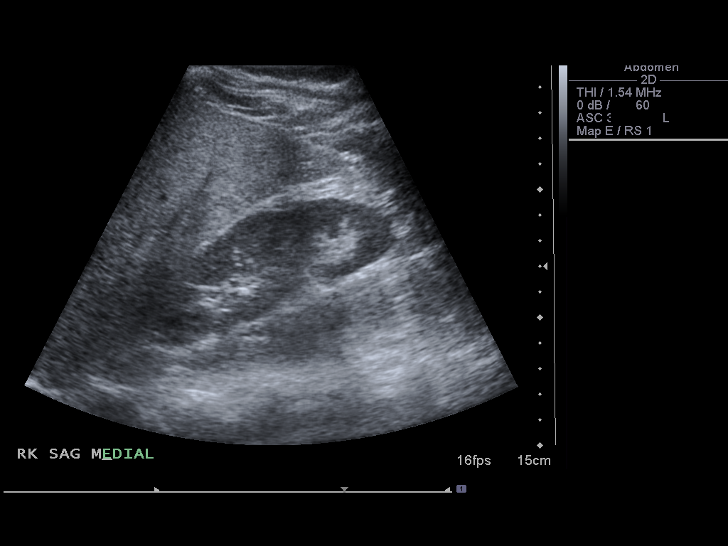
[im 37/69]
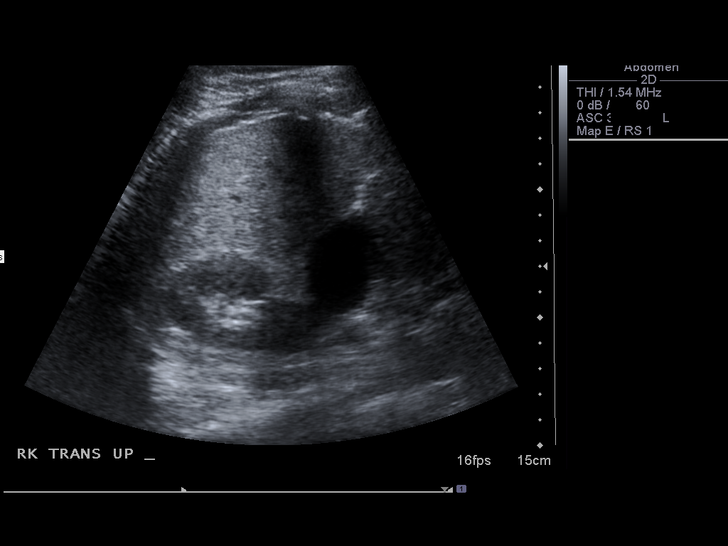
[im 43/69]
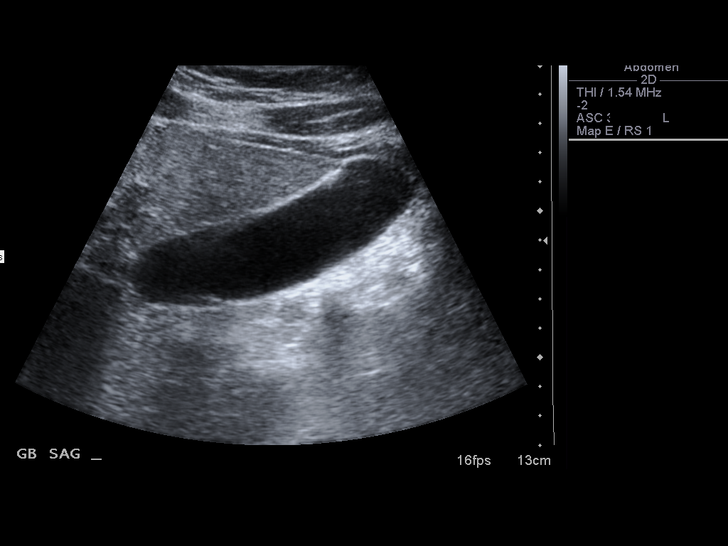
[im 46/69]
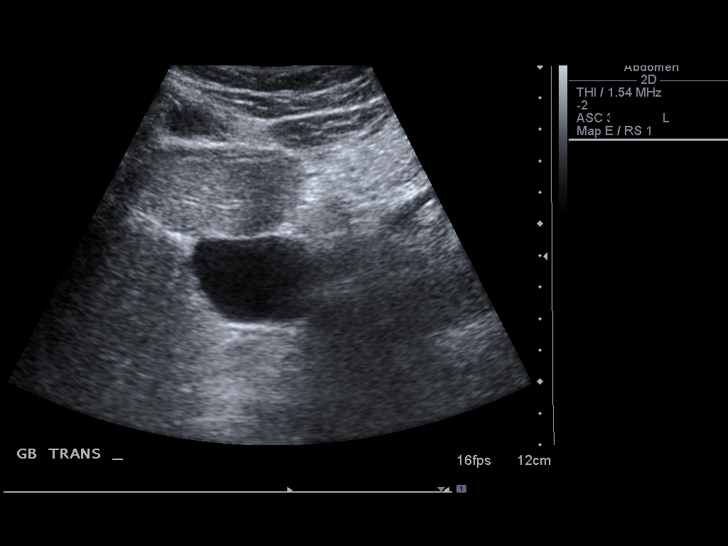
[im 52/69]
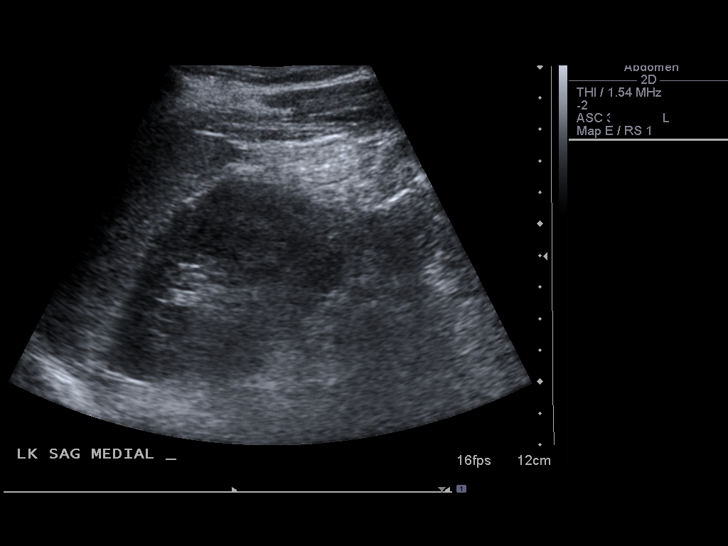
[im 57/69]
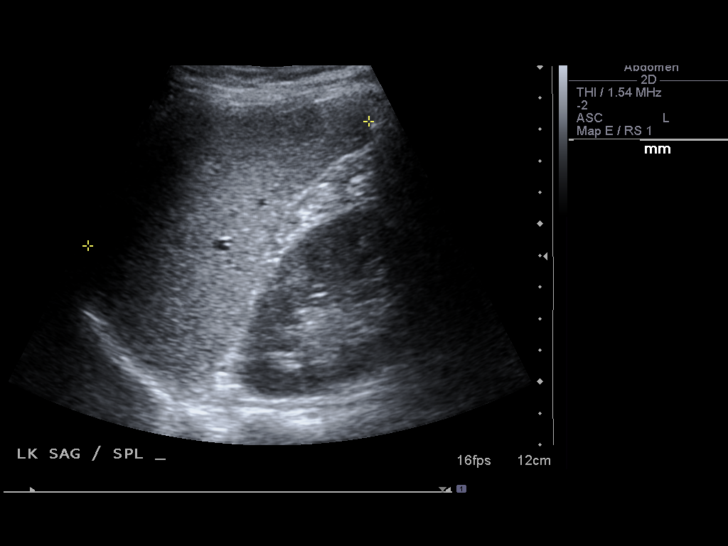
[im 63/69]
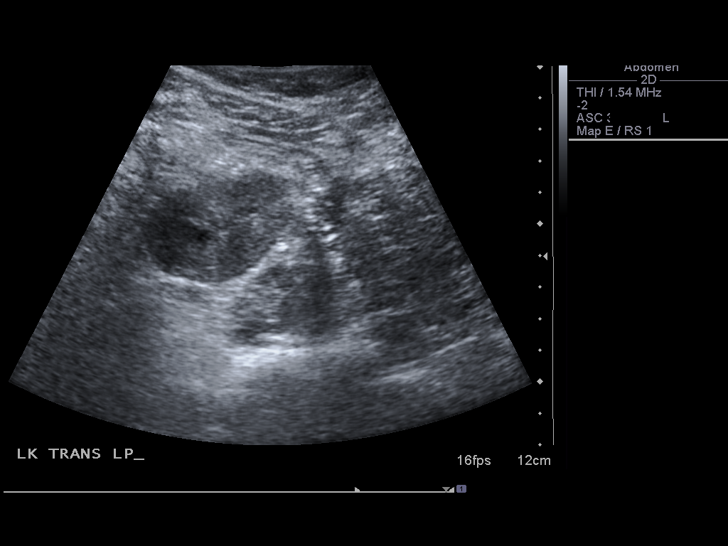
[im 69/69]
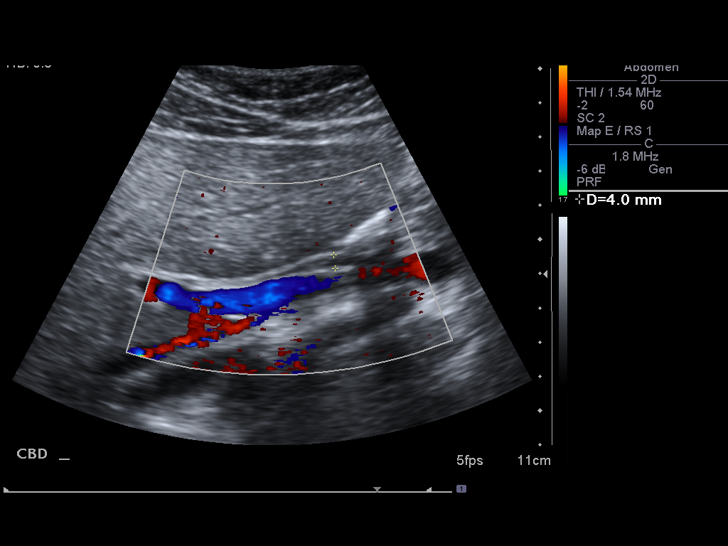

[14 of 25 positions shown; findings below may reference images not displayed]

FINDINGS: Gallbladder:  No gallstones, gallbladder wall thickening, or
pericholecystic fluid.

Common bile duct:  Measures 4.0 mm.

Liver:  No focal lesion identified.  The liver appears dense
compatible with fatty change.

IVC:  Appears normal.

Pancreas:  No focal abnormality seen.

Spleen:  Measures 9.7 cm and appears normal.

Right Kidney:  Measures 10.6 cm and appears normal.

Left Kidney:  Measures 10.7 cm and appears normal.

Abdominal aorta:  No aneurysm identified.
IMPRESSION: No acute finding.  Fatty infiltration of the liver.

## 2012-09-27 NOTE — ED Provider Notes (Signed)
Medical screening examination/treatment/procedure(s) were performed by non-physician practitioner and as supervising physician I was immediately available for consultation/collaboration.  Hurman Horn, MD 09/27/12 2225

## 2012-09-30 IMAGING — CT CT ABDOMEN W/ CM
2 of 4 series · 17 of 46 positions shown, 19 images · IV contrast (omnipaque)
Comparison: 03/20/2010and 05/04/2007 and chest CT dated 04/06/2008

CLINICAL DATA: Abdominal pain with nausea, vomiting, and diarrhea.

CT ABDOMEN WITH CONTRAST
TECHNIQUE: Multidetector CT imaging of the abdomen was performed
using the standard protocol following bolus administration of
intravenous contrast.
Contrast: 100 ml Omnipaque-3AA

[Series 2: rtn ap with st · axial · 0.66mm/px · z∈[-304,-34]mm · 14 of 60 slices shown, 16 images]
[im 3/60  soft-tissue]
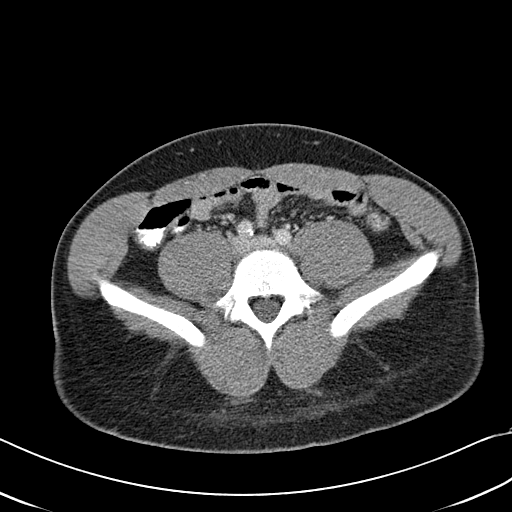
[im 3/60  bone]
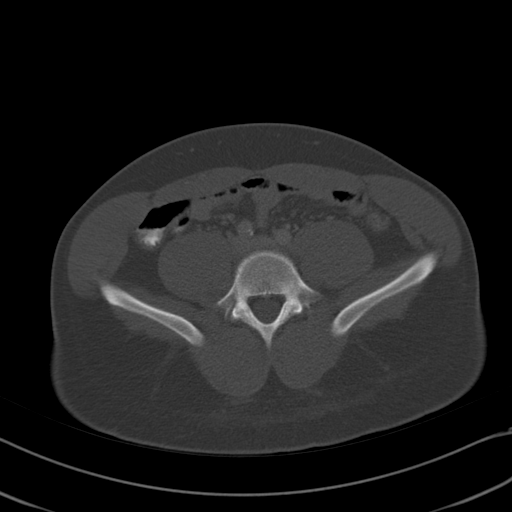
[im 9/60  soft-tissue]
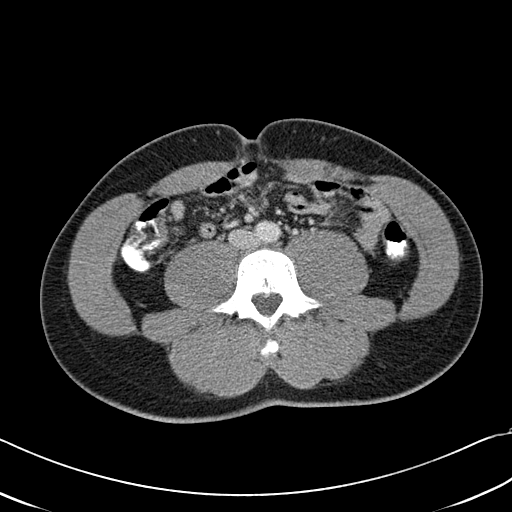
[im 11/60  soft-tissue]
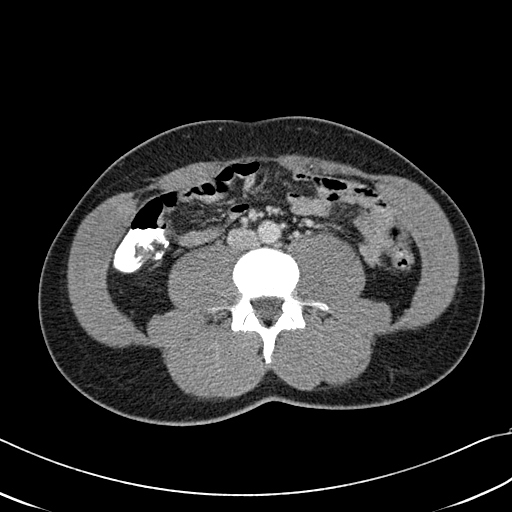
[im 17/60  soft-tissue]
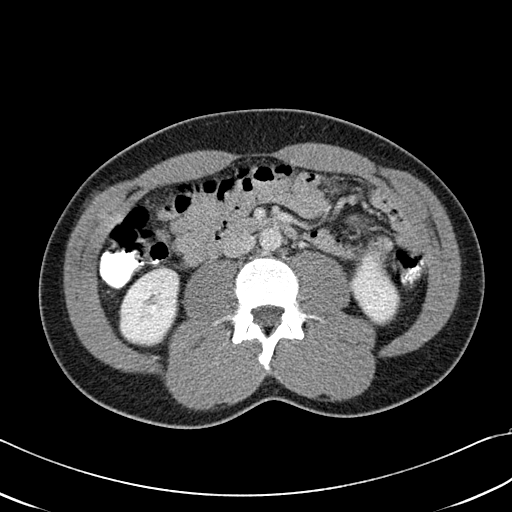
[im 19/60  soft-tissue]
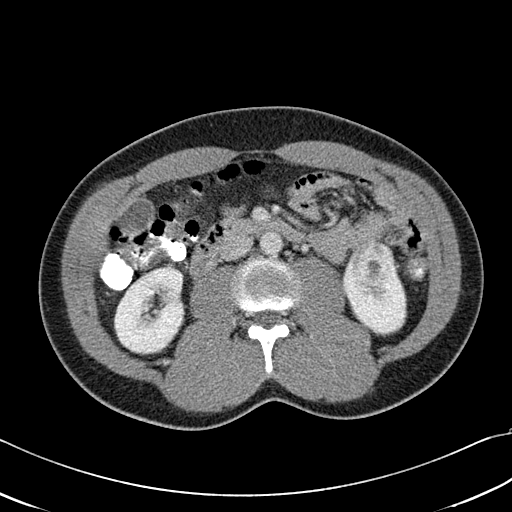
[im 25/60  soft-tissue]
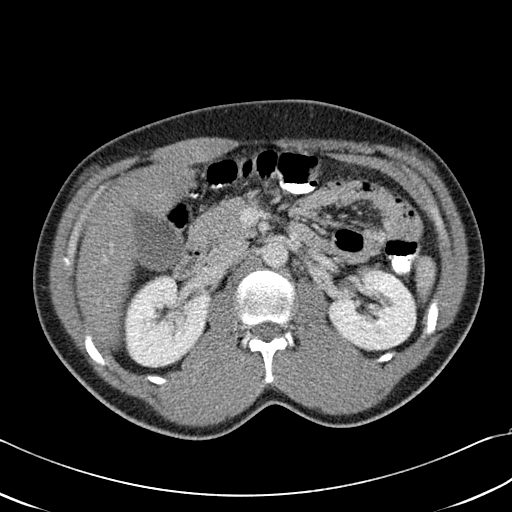
[im 27/60  soft-tissue]
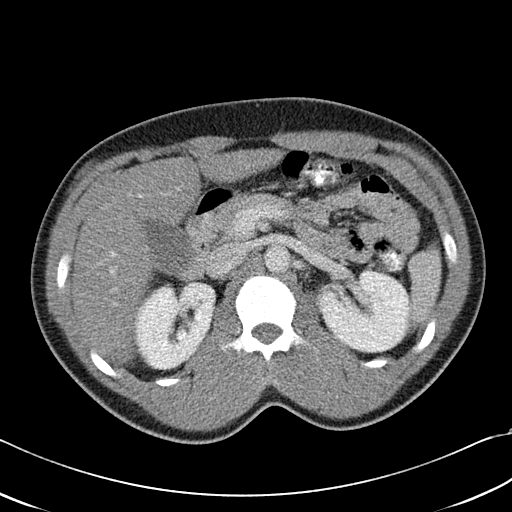
[im 33/60  soft-tissue]
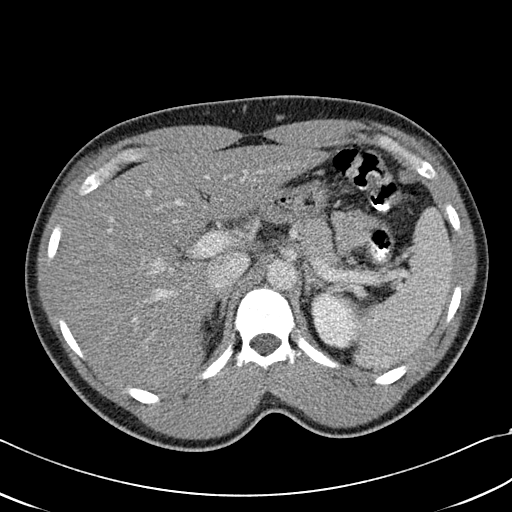
[im 35/60  soft-tissue]
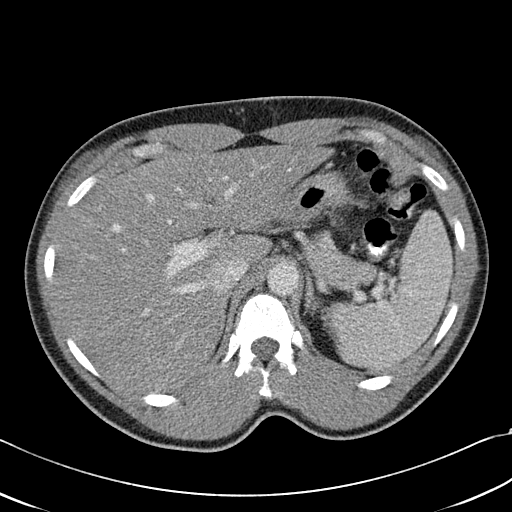
[im 35/60  bone]
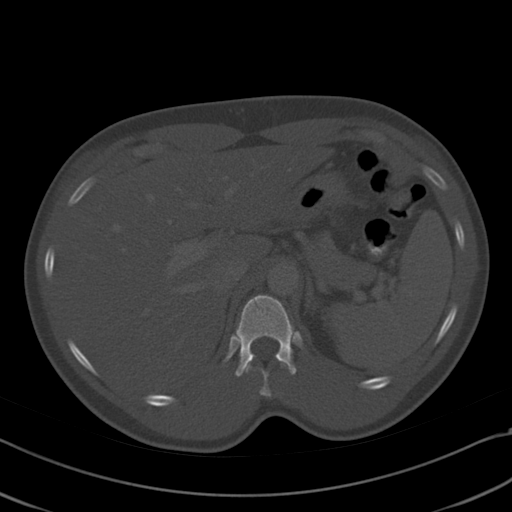
[im 41/60  soft-tissue]
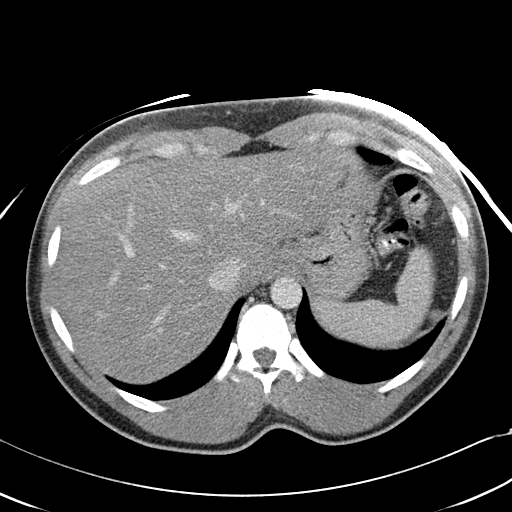
[im 43/60  soft-tissue]
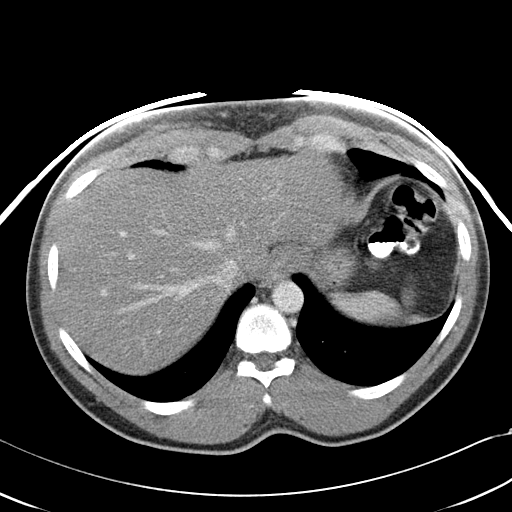
[im 49/60  soft-tissue]
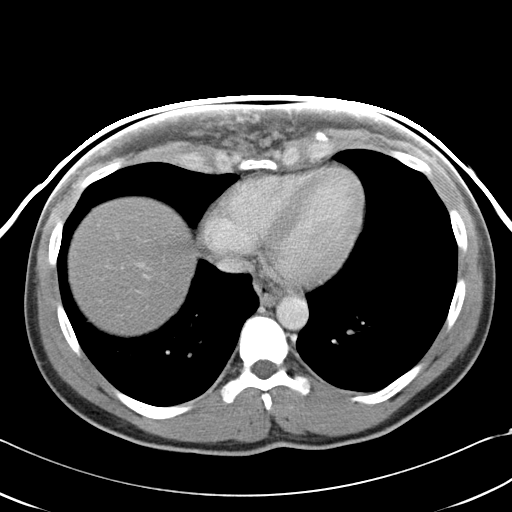
[im 51/60  soft-tissue]
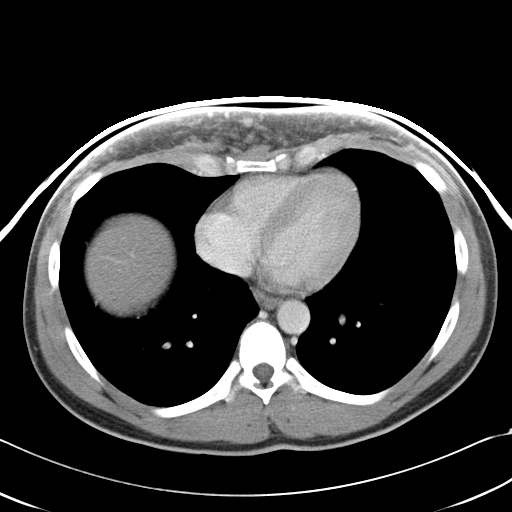
[im 57/60  soft-tissue]
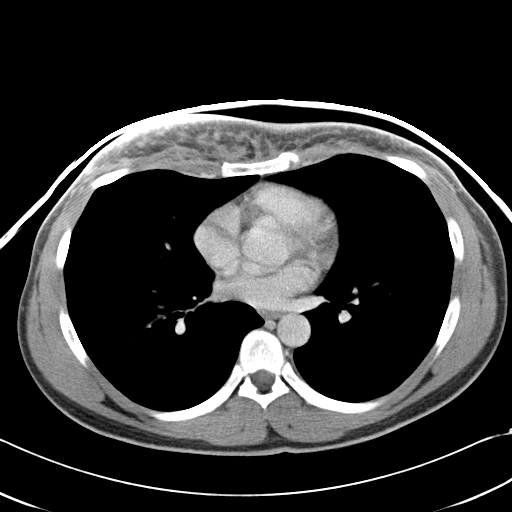

[Series 602: <mpr thick range> · coronal · 0.66mm/px · 3 of 66 slices shown]
[im 22/66  soft-tissue]
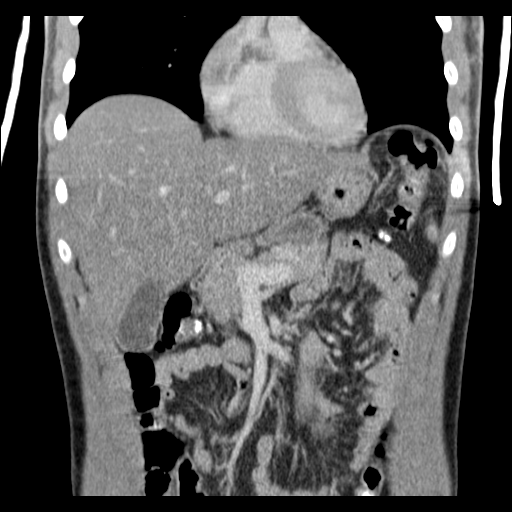
[im 29/66  soft-tissue]
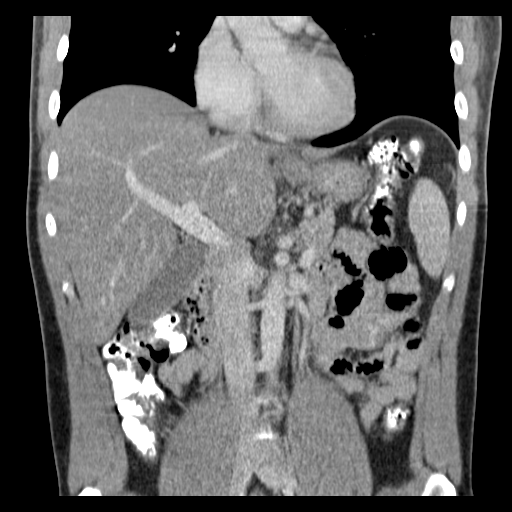
[im 37/66  soft-tissue]
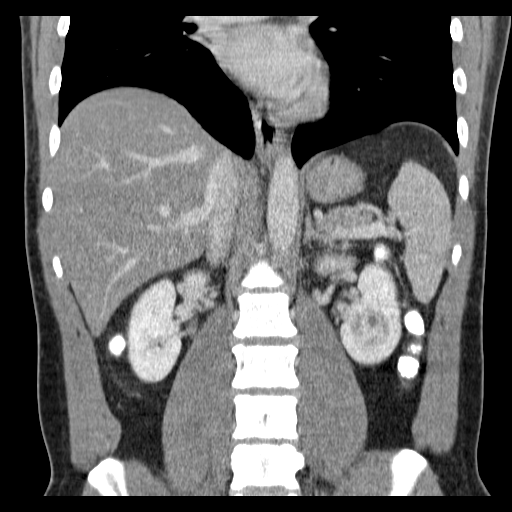

[17 of 46 positions shown; findings below may reference images not displayed]

FINDINGS: There is hepatic steatosis, unchanged since 05/04/2007.
The spleen, pancreas, adrenal glands, and right kidney are normal.
There is a 3 cm inhomogeneous mass in the anterior aspect of the
lower pole of the left kidney consistent with neoplasm,
substantially larger than on the prior study of 05/04/2007.

There is no free air or free fluid in the abdomen.  The visualized
bowel is normal.  Osseous structures are normal.  Lung bases are
clear.

There is unusual abnormal soft tissue density in the subcutaneous
fat in the lower chest asymmetric to the right.  Prior reports
state the patient has had bilateral silicone injections for breast
augmentation.
IMPRESSION: No acute abnormalities. Fatty liver.

Enlarging left renal mass consistent with renal cell carcinoma.

## 2012-11-22 ENCOUNTER — Encounter (HOSPITAL_COMMUNITY): Payer: Self-pay | Admitting: *Deleted

## 2012-11-22 ENCOUNTER — Inpatient Hospital Stay (HOSPITAL_COMMUNITY)
Admission: AD | Admit: 2012-11-22 | Discharge: 2012-11-25 | DRG: 885 | Disposition: A | Discharge status: Home / Self Care | Payer: Federal, State, Local not specified - Other | Source: Intra-hospital | Attending: Psychiatry | Admitting: Psychiatry

## 2012-11-22 ENCOUNTER — Encounter (HOSPITAL_COMMUNITY): Payer: Self-pay | Admitting: Adult Health

## 2012-11-22 ENCOUNTER — Emergency Department (HOSPITAL_COMMUNITY)
Admission: EM | Admit: 2012-11-22 | Discharge: 2012-11-22 | Disposition: A | Discharge status: Other Acute Inpatient Hospital | Payer: Self-pay | Attending: Emergency Medicine | Admitting: Emergency Medicine

## 2012-11-22 DIAGNOSIS — F3289 Other specified depressive episodes: Secondary | ICD-10-CM | POA: Insufficient documentation

## 2012-11-22 DIAGNOSIS — Z8719 Personal history of other diseases of the digestive system: Secondary | ICD-10-CM | POA: Insufficient documentation

## 2012-11-22 DIAGNOSIS — T1491XA Suicide attempt, initial encounter: Secondary | ICD-10-CM

## 2012-11-22 DIAGNOSIS — E7800 Pure hypercholesterolemia, unspecified: Secondary | ICD-10-CM | POA: Insufficient documentation

## 2012-11-22 DIAGNOSIS — F17200 Nicotine dependence, unspecified, uncomplicated: Secondary | ICD-10-CM | POA: Insufficient documentation

## 2012-11-22 DIAGNOSIS — I2510 Atherosclerotic heart disease of native coronary artery without angina pectoris: Secondary | ICD-10-CM | POA: Insufficient documentation

## 2012-11-22 DIAGNOSIS — T450X4A Poisoning by antiallergic and antiemetic drugs, undetermined, initial encounter: Secondary | ICD-10-CM | POA: Insufficient documentation

## 2012-11-22 DIAGNOSIS — F419 Anxiety disorder, unspecified: Secondary | ICD-10-CM | POA: Diagnosis present

## 2012-11-22 DIAGNOSIS — T398X2A Poisoning by other nonopioid analgesics and antipyretics, not elsewhere classified, intentional self-harm, initial encounter: Secondary | ICD-10-CM | POA: Insufficient documentation

## 2012-11-22 DIAGNOSIS — F329 Major depressive disorder, single episode, unspecified: Secondary | ICD-10-CM | POA: Insufficient documentation

## 2012-11-22 DIAGNOSIS — Z8669 Personal history of other diseases of the nervous system and sense organs: Secondary | ICD-10-CM | POA: Insufficient documentation

## 2012-11-22 DIAGNOSIS — F1010 Alcohol abuse, uncomplicated: Secondary | ICD-10-CM | POA: Diagnosis present

## 2012-11-22 DIAGNOSIS — F411 Generalized anxiety disorder: Secondary | ICD-10-CM | POA: Diagnosis present

## 2012-11-22 DIAGNOSIS — T39314A Poisoning by propionic acid derivatives, undetermined, initial encounter: Secondary | ICD-10-CM | POA: Insufficient documentation

## 2012-11-22 DIAGNOSIS — T443X1A Poisoning by other parasympatholytics [anticholinergics and antimuscarinics] and spasmolytics, accidental (unintentional), initial encounter: Secondary | ICD-10-CM | POA: Insufficient documentation

## 2012-11-22 DIAGNOSIS — F10929 Alcohol use, unspecified with intoxication, unspecified: Secondary | ICD-10-CM

## 2012-11-22 DIAGNOSIS — F4310 Post-traumatic stress disorder, unspecified: Secondary | ICD-10-CM | POA: Diagnosis present

## 2012-11-22 DIAGNOSIS — T394X2A Poisoning by antirheumatics, not elsewhere classified, intentional self-harm, initial encounter: Secondary | ICD-10-CM | POA: Insufficient documentation

## 2012-11-22 DIAGNOSIS — T50992A Poisoning by other drugs, medicaments and biological substances, intentional self-harm, initial encounter: Secondary | ICD-10-CM | POA: Insufficient documentation

## 2012-11-22 DIAGNOSIS — I739 Peripheral vascular disease, unspecified: Secondary | ICD-10-CM | POA: Diagnosis present

## 2012-11-22 DIAGNOSIS — Z880 Allergy status to penicillin: Secondary | ICD-10-CM | POA: Insufficient documentation

## 2012-11-22 DIAGNOSIS — Z79899 Other long term (current) drug therapy: Secondary | ICD-10-CM

## 2012-11-22 DIAGNOSIS — I10 Essential (primary) hypertension: Secondary | ICD-10-CM | POA: Insufficient documentation

## 2012-11-22 DIAGNOSIS — Z8679 Personal history of other diseases of the circulatory system: Secondary | ICD-10-CM | POA: Insufficient documentation

## 2012-11-22 DIAGNOSIS — F339 Major depressive disorder, recurrent, unspecified: Principal | ICD-10-CM | POA: Diagnosis present

## 2012-11-22 DIAGNOSIS — F1910 Other psychoactive substance abuse, uncomplicated: Secondary | ICD-10-CM | POA: Diagnosis present

## 2012-11-22 LAB — RAPID URINE DRUG SCREEN, HOSP PERFORMED
Amphetamines: NOT DETECTED
Benzodiazepines: NOT DETECTED
Cocaine: POSITIVE — AB
Opiates: NOT DETECTED

## 2012-11-22 LAB — SALICYLATE LEVEL: Salicylate Lvl: <2 mg/dL — ABNORMAL LOW (ref 2.8–20.0)

## 2012-11-22 LAB — COMPREHENSIVE METABOLIC PANEL
ALT: 38 U/L (ref 0–53)
AST: 43 U/L — ABNORMAL HIGH (ref 0–37)
Albumin: 3.9 g/dL (ref 3.5–5.2)
CO2: 22 mEq/L (ref 19–32)
Calcium: 9.3 mg/dL (ref 8.4–10.5)
GFR calc non Af Amer: >90 mL/min (ref >90)
Sodium: 140 mEq/L (ref 135–145)
Total Protein: 7 g/dL (ref 6.0–8.3)

## 2012-11-22 LAB — CBC
MCH: 29.3 pg (ref 26.0–34.0)
Platelets: 215 10*3/uL (ref 150–400)
RBC: 5.09 MIL/uL (ref 4.22–5.81)
WBC: 6.4 10*3/uL (ref 4.0–10.5)

## 2012-11-22 MED ORDER — ADULT MULTIVITAMIN W/MINERALS CH
1.0000 | ORAL_TABLET | Freq: Every day | ORAL | Status: DC
  Administered 2012-11-23 – 2012-11-25 (×3): 1 via ORAL
  Filled 2012-11-22 (×6): qty 1

## 2012-11-22 MED ORDER — DULOXETINE HCL 30 MG PO CPEP
30.0000 mg | ORAL_CAPSULE | Freq: Every day | ORAL | Status: DC
  Administered 2012-11-23 – 2012-11-25 (×3): 30 mg via ORAL
  Filled 2012-11-22 (×6): qty 1

## 2012-11-22 MED ORDER — ONDANSETRON HCL 4 MG PO TABS
4.0000 mg | ORAL_TABLET | Freq: Three times a day (TID) | ORAL | Status: DC | PRN
  Administered 2012-11-22: 4 mg via ORAL
  Filled 2012-11-22: qty 1

## 2012-11-22 MED ORDER — ADULT MULTIVITAMIN W/MINERALS CH
1.0000 | ORAL_TABLET | Freq: Every day | ORAL | Status: DC
  Administered 2012-11-22: 1 via ORAL
  Filled 2012-11-22: qty 1

## 2012-11-22 MED ORDER — LORAZEPAM 1 MG PO TABS
1.0000 mg | ORAL_TABLET | Freq: Four times a day (QID) | ORAL | Status: DC | PRN
  Administered 2012-11-22: 1 mg via ORAL
  Filled 2012-11-22: qty 1

## 2012-11-22 MED ORDER — CHLORDIAZEPOXIDE HCL 25 MG PO CAPS
25.0000 mg | ORAL_CAPSULE | Freq: Four times a day (QID) | ORAL | Status: AC
  Administered 2012-11-22 – 2012-11-24 (×6): 25 mg via ORAL
  Filled 2012-11-22 (×6): qty 1

## 2012-11-22 MED ORDER — SODIUM CHLORIDE 0.9 % IV SOLN
1000.0000 mL | Freq: Once | INTRAVENOUS | Status: AC
  Administered 2012-11-22: 1000 mL via INTRAVENOUS

## 2012-11-22 MED ORDER — ATORVASTATIN CALCIUM 40 MG PO TABS
40.0000 mg | ORAL_TABLET | Freq: Every day | ORAL | Status: DC
  Administered 2012-11-23 – 2012-11-25 (×3): 40 mg via ORAL
  Filled 2012-11-22 (×6): qty 1

## 2012-11-22 MED ORDER — LORAZEPAM 2 MG/ML IJ SOLN: 1.0000 mg | Freq: Four times a day (QID) | INTRAMUSCULAR | Status: DC | PRN

## 2012-11-22 MED ORDER — ARIPIPRAZOLE 2 MG PO TABS
2.0000 mg | ORAL_TABLET | Freq: Every day | ORAL | Status: DC
  Administered 2012-11-23 – 2012-11-25 (×3): 2 mg via ORAL
  Filled 2012-11-22 (×6): qty 1

## 2012-11-22 MED ORDER — VITAMIN B-1 100 MG PO TABS
100.0000 mg | ORAL_TABLET | Freq: Every day | ORAL | Status: DC
  Administered 2012-11-23 – 2012-11-25 (×3): 100 mg via ORAL
  Filled 2012-11-22 (×6): qty 1

## 2012-11-22 MED ORDER — CHLORDIAZEPOXIDE HCL 25 MG PO CAPS: 25.0000 mg | ORAL_CAPSULE | ORAL | Status: DC

## 2012-11-22 MED ORDER — CHLORDIAZEPOXIDE HCL 25 MG PO CAPS: 25.0000 mg | ORAL_CAPSULE | Freq: Four times a day (QID) | ORAL | Status: DC | PRN

## 2012-11-22 MED ORDER — THIAMINE HCL 100 MG/ML IJ SOLN: 100.0000 mg | Freq: Once | INTRAMUSCULAR | Status: DC

## 2012-11-22 MED ORDER — NICOTINE 21 MG/24HR TD PT24
21.0000 mg | MEDICATED_PATCH | Freq: Every day | TRANSDERMAL | Status: DC
  Filled 2012-11-22: qty 1

## 2012-11-22 MED ORDER — ACETAMINOPHEN 325 MG PO TABS
650.0000 mg | ORAL_TABLET | Freq: Four times a day (QID) | ORAL | Status: DC | PRN
  Administered 2012-11-22 – 2012-11-24 (×4): 650 mg via ORAL

## 2012-11-22 MED ORDER — AMLODIPINE BESYLATE 10 MG PO TABS
10.0000 mg | ORAL_TABLET | Freq: Every day | ORAL | Status: DC
  Administered 2012-11-23 – 2012-11-25 (×3): 10 mg via ORAL
  Filled 2012-11-22 (×6): qty 1

## 2012-11-22 MED ORDER — LORAZEPAM 2 MG/ML IJ SOLN: 0.0000 mg | Freq: Two times a day (BID) | INTRAMUSCULAR | Status: DC

## 2012-11-22 MED ORDER — MAGNESIUM HYDROXIDE 400 MG/5ML PO SUSP: 30.0000 mL | Freq: Every day | ORAL | Status: DC | PRN

## 2012-11-22 MED ORDER — VITAMIN B-1 100 MG PO TABS
100.0000 mg | ORAL_TABLET | Freq: Every day | ORAL | Status: DC
  Administered 2012-11-22: 100 mg via ORAL
  Filled 2012-11-22: qty 1

## 2012-11-22 MED ORDER — CHLORDIAZEPOXIDE HCL 25 MG PO CAPS: 25.0000 mg | ORAL_CAPSULE | Freq: Every day | ORAL | Status: DC

## 2012-11-22 MED ORDER — LOPERAMIDE HCL 2 MG PO CAPS: 2.0000 mg | ORAL_CAPSULE | ORAL | Status: DC | PRN

## 2012-11-22 MED ORDER — ONDANSETRON 4 MG PO TBDP
4.0000 mg | ORAL_TABLET | Freq: Four times a day (QID) | ORAL | Status: DC | PRN
  Administered 2012-11-23: 4 mg via ORAL

## 2012-11-22 MED ORDER — LORAZEPAM 2 MG/ML IJ SOLN
0.0000 mg | Freq: Four times a day (QID) | INTRAMUSCULAR | Status: DC
  Administered 2012-11-22: 1 mg via INTRAVENOUS
  Filled 2012-11-22: qty 1

## 2012-11-22 MED ORDER — TRAZODONE HCL 150 MG PO TABS
150.0000 mg | ORAL_TABLET | Freq: Every day | ORAL | Status: DC
  Administered 2012-11-22 – 2012-11-24 (×3): 150 mg via ORAL
  Filled 2012-11-22 (×2): qty 1
  Filled 2012-11-22: qty 3
  Filled 2012-11-22 (×4): qty 1

## 2012-11-22 MED ORDER — THIAMINE HCL 100 MG/ML IJ SOLN: 100.0000 mg | Freq: Every day | INTRAMUSCULAR | Status: DC

## 2012-11-22 MED ORDER — CHLORDIAZEPOXIDE HCL 25 MG PO CAPS
25.0000 mg | ORAL_CAPSULE | Freq: Three times a day (TID) | ORAL | Status: AC
  Administered 2012-11-24 – 2012-11-25 (×3): 25 mg via ORAL
  Filled 2012-11-22 (×3): qty 1

## 2012-11-22 MED ORDER — FOLIC ACID 1 MG PO TABS
1.0000 mg | ORAL_TABLET | Freq: Every day | ORAL | Status: DC
  Administered 2012-11-22: 1 mg via ORAL
  Filled 2012-11-22: qty 1

## 2012-11-22 MED ORDER — ALUM & MAG HYDROXIDE-SIMETH 200-200-20 MG/5ML PO SUSP: 30.0000 mL | ORAL | Status: DC | PRN

## 2012-11-22 MED ORDER — RISPERIDONE 0.5 MG PO TABS
1.0000 mg | ORAL_TABLET | Freq: Two times a day (BID) | ORAL | Status: DC
  Administered 2012-11-22: 1 mg via ORAL
  Filled 2012-11-22: qty 2

## 2012-11-22 MED ORDER — HYDROXYZINE HCL 25 MG PO TABS: 25.0000 mg | ORAL_TABLET | Freq: Four times a day (QID) | ORAL | Status: DC | PRN

## 2012-11-22 MED ORDER — SODIUM CHLORIDE 0.9 % IV SOLN: 1000.0000 mL | INTRAVENOUS | Status: DC

## 2012-11-22 MED ORDER — SODIUM CHLORIDE 0.9 % IV SOLN
1000.0000 mL | INTRAVENOUS | Status: DC
  Administered 2012-11-22: 1000 mL via INTRAVENOUS

## 2012-11-22 NOTE — ED Notes (Signed)
Pt roomed visually from nurses station

## 2012-11-22 NOTE — BH Assessment (Signed)
Assessment Note   [name] is an 32 y.o. male that was bib POV (though he cannot remember by whom) after stating that he took 65 otc sleeping tablets in a + suicide attempt.  Pt was also intoxicated with a BAL of .228.  Pt admits feeling suicidal a/w job loss and imminent homelessness.  Pt is currently confused and only oriented to person and place, and is aphasic with impaired memory.  Pt has a lengthy mental health history and at least 4 previous hospitalizations for same.  Pt is non-compliant with his psychiatric treatment.  Pt is also responding to internal stimuli and is alternately electively mute and catatonic.  Pt reports that his parents are minimally supportive and "have kicked me out again, so I have nowhere to go."  Pt admits use of alcohol, but denies current Cocaine use, which he is also positive for.  Pt denies HI or any hx of violence.  Pt admits inability to contract for safety.  A Telepsych request has been ordered and sent.  Dr. Preston Fleeting and nursing staff notified and aware that pt is being screened for inpatient admission.  Awaiting Telepsych results and placement disposition.   Axis I: Substance Induced Mood Disorder Axis II: Cluster B Traits Axis III:  Past Medical History  Diagnosis Date  . Seizures   . Hypertension   . Depression   . Pancreatitis   . Liver cirrhosis   . Coronary artery disease   . Angina   . Shortness of breath   . Headache   . Peripheral vascular disease April 2011    Left Pop  . Hypercholesteremia    Axis IV: economic problems, housing problems, other psychosocial or environmental problems, problems related to social environment and problems with primary support group Axis V: 21-30 behavior considerably influenced by delusions or hallucinations OR serious impairment in judgment, communication OR inability to function in almost all areas  Past Medical History:  Past Medical History  Diagnosis Date  . Seizures   . Hypertension   . Depression    . Pancreatitis   . Liver cirrhosis   . Coronary artery disease   . Angina   . Shortness of breath   . Headache   . Peripheral vascular disease April 2011    Left Pop  . Hypercholesteremia     Past Surgical History  Procedure Laterality Date  . Chest surgery    . Left leg surgery    . Mastectomy      Family History:  Family History  Problem Relation Age of Onset  . Stroke Other     Social History:  reports that he has been smoking Cigarettes.  He has a 4 pack-year smoking history. He has never used smokeless tobacco. He reports that he drinks about 1.8 ounces of alcohol per week. He reports that he does not use illicit drugs.  Additional Social History:  Alcohol / Drug Use Pain Medications: See MAR Prescriptions: Yes; see MAR Over the Counter: Yes; see MAR History of alcohol / drug use?: Yes Longest period of sobriety (when/how long): unknown; pt unable to say Negative Consequences of Use: Financial;Personal relationships;Work / Programmer, multimedia Withdrawal Symptoms: Blackouts;Delirium Substance #1 Name of Substance 1: Alcohol 1 - Age of First Use: 13 1 - Amount (size/oz): unknown; pt won't say 1 - Frequency: frequently, but pt won't say specifically 1 - Duration: years 1 - Last Use / Amount: last night- 5/06 Substance #2 Name of Substance 2: Cocaine 2 - Age of First  Use: unknown; pt won't say 2 - Amount (size/oz): unknown; pt denies current use 2 - Frequency: unknown; pt denies current use 2 - Duration: years 2 - Last Use / Amount: unknown; pt denies current use  CIWA: CIWA-Ar BP: 130/85 mmHg Pulse Rate: 119 Nausea and Vomiting: no nausea and no vomiting Tactile Disturbances: moderately severe hallucinations Tremor: two Auditory Disturbances: not present Paroxysmal Sweats: barely perceptible sweating, palms moist Visual Disturbances: not present Anxiety: two Headache, Fullness in Head: none present Agitation: moderately fidgety and restless Orientation and Clouding  of Sensorium: cannot do serial additions or is uncertain about date CIWA-Ar Total: 14 COWS:    Allergies:  Allergies  Allergen Reactions  . Morphine Itching  . Penicillins Swelling    Home Medications:  (Not in a hospital admission)  OB/GYN Status:  No LMP for male patient.  General Assessment Data Location of Assessment: [facility] ED Living Arrangements: Other (Comment) (reports in between his mother's and homelessness) Can pt return to current living arrangement?: No Admission Status: Voluntary Is patient capable of signing voluntary admission?: Yes Transfer from: Acute Hospital Referral Source: Self/Family/Friend  Education Status Is patient currently in school?: No  Risk to self Suicidal Ideation: Yes-Currently Present Suicidal Intent: No Is patient at risk for suicide?: Yes Suicidal Plan?: Yes-Currently Present Specify Current Suicidal Plan: to overdose on medications Access to Means: Yes Specify Access to Suicidal Means: medications available What has been your use of drugs/alcohol within the last 12 months?: alcohol and Cocaine + on labs; noted use of Cannius and pills as well by hx Previous Attempts/Gestures: Yes How many times?:  (at least 4) Other Self Harm Risks: reckless and unpredictable; non-compliant; chronicity Triggers for Past Attempts: Family contact;Other personal contacts;Hallucinations;Unpredictable Intentional Self Injurious Behavior: Damaging Comment - Self Injurious Behavior: ongoing substance abuse use Family Suicide History: No Recent stressful life event(s): Conflict (Comment);Loss (Comment);Financial Problems;Job Loss;Recent negative physical changes;Turmoil (Comment) Persecutory voices/beliefs?:  (reports of losing his job and imminent homelessness) Depression: Yes Depression Symptoms: Insomnia;Guilt;Loss of interest in usual pleasures;Feeling worthless/self pity Substance abuse history and/or treatment for substance abuse?: Yes Suicide  prevention information given to non-admitted patients: Not applicable  Risk to Others Homicidal Ideation: No Thoughts of Harm to Others: No Current Homicidal Intent: No Current Homicidal Plan: No Access to Homicidal Means: No Identified Victim: pt denies History of harm to others?: No Assessment of Violence:  (pt denies) Violent Behavior Description: pt denies hx of violence Does patient have access to weapons?: No Criminal Charges Pending?: No Does patient have a court date: No  Psychosis Hallucinations: Auditory (pt is responding to internal stimuli throughout assessment) Delusions: None noted  Mental Status Report Appear/Hygiene: Disheveled Eye Contact: Fair Motor Activity: Restlessness;Unsteady Speech: Aphasic;Soft;Slurred;Elective mutism Level of Consciousness: Quiet/awake;Restless Mood: Anxious;Empty Affect: Inconsistent with thought content Anxiety Level: Moderate Thought Processes: Irrelevant;Flight of Ideas Judgement: Impaired Orientation: Person;Place Obsessive Compulsive Thoughts/Behaviors: Moderate  Cognitive Functioning Concentration: Decreased Memory: Recent Impaired;Remote Impaired IQ: Average Insight: Poor Impulse Control: Poor Appetite: Good Weight Loss: 0 Weight Gain: 0 Sleep:  (unknown; pt unable to say) Total Hours of Sleep:  (unknown; pt unable to say) Vegetative Symptoms: None  ADLScreening [facility] Assessment Services) [name] cognitive ability adequate to safely complete daily activities?: Yes Patient able to express need for assistance with ADLs?: Yes Independently performs ADLs?: Yes (appropriate for developmental age)  Abuse/Neglect [facility]) Physical Abuse: Denies Verbal Abuse: Denies Sexual Abuse: Denies  Prior Inpatient Therapy Prior Inpatient Therapy: Yes Prior Therapy Dates: 2014 and prior Prior Therapy Facilty/Provider(s):  [facility] and others Reason for Treatment: mood disturbances and SA  Prior Outpatient Therapy Prior Outpatient  Therapy: Yes Prior Therapy Dates: currently Prior Therapy Facilty/Provider(s): [facility] Reason for Treatment: medication management  ADL Screening (condition at time of admission) [name] cognitive ability adequate to safely complete daily activities?: Yes Patient able to express need for assistance with ADLs?: Yes Independently performs ADLs?: Yes (appropriate for developmental age) Weakness of Legs: None Weakness of Arms/Hands: None       Abuse/Neglect Assessment (Assessment to be complete while patient is alone) Physical Abuse: Denies Verbal Abuse: Denies Sexual Abuse: Denies Exploitation of patient/patient's resources: Denies Self-Neglect: Denies     Merchant navy officer (For Healthcare) Advance Directive: Patient does not have advance directive    Additional Information 1:1 In Past 12 Months?: No CIRT Risk: No Elopement Risk: No Does patient have medical clearance?: Yes     Disposition:  Telepsych ordered and being considered for inpatient psychiatric admission. Disposition Initial Assessment Completed for this Encounter: Yes Disposition of Patient: Inpatient treatment program Type of inpatient treatment program: Adult  On Site Evaluation by:   Reviewed with Physician:     Angelica Ran 11/22/2012 8:45 AM

## 2012-11-22 NOTE — ED Notes (Signed)
Pt had urine incontinence in bed & floor, pt taken to bathroom for Bowel movement, pt states, "This normally does not happen. I will use that next time ( pointing to the urinal). Pt A&O x4, follows commands, speaks in complete sentences

## 2012-11-22 NOTE — ED Notes (Signed)
Called [employer] for sitter, stated would send when available.

## 2012-11-22 NOTE — ED Notes (Signed)
Called security to wand pt 

## 2012-11-22 NOTE — Progress Notes (Signed)
32 year old male patient admitted after recent suicide attempt. On admission, pt endorses depression and having passive thoughts of SI but able to contract for safety on the unit. Pt reports that he was sober for about 8 months prior to relapsing about a week ago. Pt reports stressors being a job loss and also not having a place to stay, however pt states that he will go live with his mother when he discharges from here. Pt reports that he has been taking his medications as prescribed, pt denies any auditory hallucinations. Pt does state that he has his own psychiatrist that he follows up with. Pt was oriented to the unit and safety maintained.

## 2012-11-22 NOTE — ED Provider Notes (Signed)
History     CSN: [id number]  Arrival date & time 11/22/12  0125   First MD Initiated Contact with Patient 11/22/12 0220      Chief Complaint  Patient presents with  . Drug Overdose   Level V caveat for altered mental status, somnolence secondary to Benadryl intoxication  HPI [name] is a 32 y.o. male history of seizures, depression, coronary artery disease, cirrhosis with prior suicide attempts presents with depressive episode, patient then took 56 Advil PM this and attempted to hang himself.  Patient is very drowsy and very poor historian. Patient denies any pain at this time.  Past Medical History  Diagnosis Date  . Seizures   . Hypertension   . Depression   . Pancreatitis   . Liver cirrhosis   . Coronary artery disease   . Angina   . Shortness of breath   . Headache   . Peripheral vascular disease April 2011    Left Pop  . Hypercholesteremia     Past Surgical History  Procedure Laterality Date  . Chest surgery    . Left leg surgery    . Mastectomy      Family History  Problem Relation Age of Onset  . Stroke Other     History  Substance Use Topics  . Smoking status: Current Every Day Smoker -- 1.00 packs/day for 4 years    Types: Cigarettes  . Smokeless tobacco: Never Used  . Alcohol Use: 1.8 oz/week    1 Glasses of wine, 2 Cans of beer per week     Comment: pint daily      Review of Systems Level V caveat for altered mental status Allergies  Morphine and Penicillins  Home Medications   Current Outpatient Rx  Name  Route  Sig  Dispense  Refill  . amLODipine (NORVASC) 10 MG tablet   Oral   Take 1 tablet (10 mg total) by mouth daily. For blood pressure   30 tablet   0   . ARIPiprazole (ABILIFY) 2 MG tablet   Oral   Take 1 tablet (2 mg total) by mouth daily. For mood stabilization   30 tablet   0   . atorvastatin (LIPITOR) 40 MG tablet   Oral   Take 1 tablet (40 mg total) by mouth daily. For high cholesterol         . DULoxetine  (CYMBALTA) 30 MG capsule   Oral   Take 1 capsule (30 mg total) by mouth daily.   30 capsule   0     For depression and anxiety   . hydrOXYzine (ATARAX/VISTARIL) 50 MG tablet   Oral   Take 1 tablet (50 mg total) by mouth every 6 (six) hours as needed for anxiety.   30 tablet   0   . promethazine (PHENERGAN) 25 MG tablet   Oral   Take 1 tablet (25 mg total) by mouth every 6 (six) hours as needed for nausea.   15 tablet   0   . traZODone (DESYREL) 150 MG tablet   Oral   Take 1 tablet (150 mg total) by mouth at bedtime as needed. For sleep   30 tablet   0     BP 130/85  Pulse 119  Temp(Src) 98.5 F (36.9 C) (Oral)  Resp 22  SpO2 100%  Physical Exam  Nursing notes reviewed.  Electronic medical record reviewed. VITAL SIGNS:   Filed Vitals:   11/22/12 0600 11/22/12 0630 11/22/12 0636 11/22/12 0700  BP: 128/90 165/93 137/92 130/85  Pulse: 120  119   Temp:   98.5 F (36.9 C)   TempSrc:   Oral   Resp: 21 17  22   SpO2: 98%  100%    CONSTITUTIONAL: Awake, oriented to self, very distracted, very somnolent, easily arousable HENT: Atraumatic, normocephalic, oral mucosa pink and moist, airway patent. Nares patent without drainage. External ears normal. EYES: Conjunctiva clear, EOMI, PERRLA NECK: Trachea midline, non-tender, supple CARDIOVASCULAR: Tachycardia, Normal rhythm  PULMONARY/CHEST: Clear to auscultation, no rhonchi, wheezes, or rales. Symmetrical breath sounds. Non-tender. ABDOMINAL: Non-distended, soft, non-tender - no rebound or guarding.  BS normal. NEUROLOGIC: GCS 15 but somnolent, Non-focal, moving all four extremities, no gross sensory or motor deficits. EXTREMITIES: No clubbing, cyanosis, or edema SKIN: Warm, Dry, No erythema, No rash  ED Course  Procedures (including critical care time)  Date: 11/22/2012  Rate: 102  Rhythm: sinus tachycardia  QRS Axis: normal  Intervals: normal  ST/T Wave abnormalities: Some diffuse T-wave abnormalities, seen on  prior EKG  Conduction Disutrbances: none  Narrative Interpretation: Sinus tachycardia, nothing new in his EKG besides a faster rate. There is no evidence of sodium channel blockade including:Interventricular conduction delay - QRS > 100 ms in lead II, Right axis deviation of the terminal QRS: Terminal R wave > 3 mm in aVR or R/S ratio > 0.7 in aVR     Labs Reviewed  CBC - Abnormal; Notable for the following:    MCHC 36.7 (*)    RDW 16.1 (*)    All other components within normal limits  COMPREHENSIVE METABOLIC PANEL - Abnormal; Notable for the following:    Potassium 3.4 (*)    Glucose, Bld 115 (*)    BUN 5 (*)    AST 43 (*)    Total Bilirubin 0.2 (*)    All other components within normal limits  ETHANOL - Abnormal; Notable for the following:    Alcohol, Ethyl (B) 228 (*)    All other components within normal limits  SALICYLATE LEVEL - Abnormal; Notable for the following:    Salicylate Lvl <2.0 (*)    All other components within normal limits  URINE RAPID DRUG SCREEN (HOSP PERFORMED) - Abnormal; Notable for the following:    Cocaine POSITIVE (*)    All other components within normal limits  GLUCOSE, CAPILLARY - Abnormal; Notable for the following:    Glucose-Capillary 107 (*)    All other components within normal limits  ACETAMINOPHEN LEVEL   No results found.   1. Suicide attempt   2. Anticholinergic drug overdose, initial encounter   3. NSAID overdose, initial encounter       MDM  Arnell Slivinski is a 32 y.o. male presented overdose of ibuprofen and Benadryl. Patient took over 2 g of Benadryl, patient is appropriate for tachycardia, patient has dilated pupils, is not agitated, blood pressure is elevated but not markedly, pulse rate is in the 120s. I do not think physostigmine is appropriate at this time, will hold benzodiazepine secondary to current somnolence. Does not appear to be any concurrent coingestants, no evidence for sodium channel blockade, QRS is 88. Drug screen  positive for cocaine, ethanol is also positive at 228 likely contributing to the patient's somnolence. No acetaminophen or salicylates on board. Patient is not complaining about any pain, we'll not administer any type of an adult. Ingestion was greater than one hour ago, activated charcoal not indicated.  Patient still is suicidal, we'll have asked seen in tele-psychiatry evaluate  the patient in the morning when they are able to get a better psychiatric history.         Jones Skene, MD 11/22/12 4098

## 2012-11-22 NOTE — ED Notes (Signed)
Pt states, "I don't want to go to [facility]. I went there 3 times." pt informed of IVC possiblity, pt verbalizes understanding, Preston Fleeting, MD notified, Informed to contact ACT team to start IVC paperwork

## 2012-11-22 NOTE — ED Notes (Signed)
Family updated on plan of care  

## 2012-11-22 NOTE — ED Notes (Signed)
Pt dry heaving, pt to receive PRN Zofran 4 mg

## 2012-11-22 NOTE — ED Notes (Signed)
Pt reports taking 60 advil Pms this eveniing and attempting to hang self. HR 120s, pt is drowsy, but easily arousable. Took the pills one hour ago.

## 2012-11-22 NOTE — ED Notes (Signed)
Pt stated he could not urinate right now. Would let staff know when he could.

## 2012-11-22 NOTE — BH Assessment (Signed)
[facility] Assessment Progress Note      Consulted Serena Colonel NP re admission for this client to [facility] from [facility]. He has been accepted to the 300 hall when a bed is available.

## 2012-11-22 NOTE — ED Provider Notes (Addendum)
Patient admitted last night for overdose. I went in to evaluate him and he is so weak and aortotomy. He is oriented to person and place and year but not to day or date. He is able to tell me that he tried suicide by taking a bunch of pills. However, in the course of talking with him he, and he alternates between speaking clearly and fluently and staring straight ahead. This seems to be a primary psychiatric condition and that psychiatric consultation has been requested.  Dione Booze, MD 11/22/12 [phone]  Psychiatry consult appreciated. Psychiatrist agrees that he is showing signs of psychosis and recommend starting risperidone and also Ativan for prevention of alcohol withdrawal and admission to inpatient psychiatric hospital.  Dione Booze, MD 11/22/12 1058

## 2012-11-22 NOTE — Progress Notes (Signed)
Patient ID: [name], male   DOB: 26-Aug-1980, 32 y.o.   MRN: [id number]  D: Writer asked about circumstances surrounding his adm. Pt stated that he was on his way to his friends house, when he saw "all the police there". Pt stated, "he was running".  Writer, "ok, how did that make you want to harm youself?" "It's just a lot going, on". Pt didn't take the time to explain the situation.  A:  Support and encouragement was offered. 15 min checks continued for safety.  R: Pt remains safe.

## 2012-11-22 NOTE — [facility] Counselor (Signed)
Pt has been accepted to [address] Armandina Stammer NP to Geoffery Lyons MD

## 2012-11-22 NOTE — ED Notes (Signed)
Pt. wanded by security. All personal belongings removed from Pt. And room and placed at the nurse's desk.

## 2012-11-22 NOTE — ED Notes (Signed)
Waiting on Magistrate's office to communicate with [employer] about escorting pt to [facility] due to pt's IVC status.

## 2012-11-22 NOTE — Tx Team (Signed)
Initial Interdisciplinary Treatment Plan  PATIENT STRENGTHS: (choose at least two) Ability for insight General fund of knowledge Motivation for treatment/growth  PATIENT STRESSORS: Financial difficulties Substance abuse   PROBLEM LIST: Problem List/Patient Goals Date to be addressed Date deferred Reason deferred Estimated date of resolution  Depression 11/22/12     Substance Abuse 11/22/12     Suicidal Ideation 11/22/12                                          DISCHARGE CRITERIA:  Improved stabilization in mood, thinking, and/or behavior Verbal commitment to aftercare and medication compliance  PRELIMINARY DISCHARGE PLAN: Attend aftercare/continuing care group Placement in alternative living arrangements  PATIENT/FAMIILY INVOLVEMENT: This treatment plan has been presented to and reviewed with the patient, [name], and/or family member, .  The patient and family have been given the opportunity to ask questions and make suggestions.  [name], [place] 11/22/2012, 9:50 PM

## 2012-11-23 ENCOUNTER — Encounter (HOSPITAL_COMMUNITY): Payer: Self-pay | Admitting: Psychiatry

## 2012-11-23 DIAGNOSIS — F329 Major depressive disorder, single episode, unspecified: Secondary | ICD-10-CM

## 2012-11-23 DIAGNOSIS — F4310 Post-traumatic stress disorder, unspecified: Secondary | ICD-10-CM

## 2012-11-23 DIAGNOSIS — F1410 Cocaine abuse, uncomplicated: Secondary | ICD-10-CM

## 2012-11-23 DIAGNOSIS — F1020 Alcohol dependence, uncomplicated: Secondary | ICD-10-CM

## 2012-11-23 NOTE — [facility] Group Notes (Signed)
[facility] LCSW Aftercare Discharge Planning Group Note   11/23/2012  Participation Quality:  Did Not Attend   [name]   

## 2012-11-23 NOTE — [facility] Counselor (Signed)
  Adult Psychosocial Assessment Update  Interdisciplinary Team  Previous [facility] admissions/discharges:  Admissions  Discharges   Date: 07/05/12  Date: 07/11/12   Date: 06/28/12  Date: 1217/13   Date: 03/29/12  Date: 04/04/12  Date:  Date:  Date:  Date:   Changes since the last Psychosocial Assessment (including adherence to outpatient mental health and/or substance abuse treatment, situational issues contributing to decompensation and/or relapse).  Pt discharged from [facility] on 07/05/12. Pt states that he was to follow up at   [facility]. Reports he recently lost his job and has been kicked out of his parents home.  Patient has been non compliant with medications. This is patient's fourth hospitalization within the last 12 months.Patient reports he has done better than usual since last discharge as he has remained clean until last week. 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 Patient has spoken with parents who are willing to help.       Discharge Plan  1. Will you be returning to the same living situation after discharge?  Yes:  No: X If no, what is your plan?    Patient lost apartment due to inability to pay rent and will return to parental home      2. Would you like a referral for services when you are discharged?  Yes:X  If yes, for what services?   No:    [facility] for medication management and therapy      Summary and Recommendations (to be completed by the evaluator)    Patient is an unemployeed 32 y.o. African American Male admitted with diagnosis of Substance Induced Mood Disorder after taking an overdose of medication in a suicide attempt. Pt had a BAL of .228. Patient would benefit from crisis stabilization, medication evaluation, therapy groups for processing thoughts/feelings/experiences, psycho ed groups for coping skills, and case management for discharge planning                    Signature: Carney Bern, Theresia Majors  11/23/2012
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
## 2012-11-23 NOTE — Progress Notes (Signed)
Recreation Therapy Notes  Date: 05.08.2014  Time: 3:00pm  Location: [address] Dayroom   Group Topic/Focus: Decision Making, Problem Solving  Participation Level:  Did not attend  [employer], LRT/CTRS  Jearl Klinefelter 11/23/2012 4:26 PM

## 2012-11-23 NOTE — Progress Notes (Signed)
Pt did not attend Karaoke group, citing stomach issues.

## 2012-11-23 NOTE — Progress Notes (Signed)
Patient ID: [name], male   DOB: 06-27-1981, 32 y.o.   MRN: [id number]  D: Pt denies SI/HI/AVH. Pt is pleasant and cooperative. Pt complained of a little nausea and HA 8 out of 10, Pt went to sleep 30 min after taking medication. Pt remained sleep most of the night and forward little information.  A: Pt was offered support and encouragement. Pt was given scheduled medications. Pt was encourage to attend groups. Q 15 minute checks were done for safety.   R:Pt  Did not attend karoke. Pt is taking medication.Pt receptive to treatment and safety maintained on unit.

## 2012-11-23 NOTE — Progress Notes (Signed)
D: Patient pleasant and cooperative with staff and peers. [name] affect/mood is depressed. Declined self inventory sheet. Writer observed patient lying in bed the majority of the day. He stated, "I believe I'm beginning to go through withdrawals and just been wanting to rest today". Patient has not attended groups, but is compliant with medication regimen.  A: Support and encouragement provided to patient. Scheduled medications administered per MD orders. Maintain Q15 minute checks for safety.  R: Patient receptive. Denies SI/HI/AVH. Patient remains safe.

## 2012-11-23 NOTE — [facility] Suicide Risk Assessment (Signed)
Suicide Risk Assessment  Admission Assessment     Nursing information obtained from:  Patient Demographic factors:  Male;Low socioeconomic status;Living alone;Unemployed Current Mental Status:  Self-harm thoughts Loss Factors:  Decrease in vocational status;Financial problems / change in socioeconomic status Historical Factors:  Family history of mental illness or substance abuse Risk Reduction Factors:  Sense of responsibility to family;Positive social support  CLINICAL FACTORS:   Depression:   Comorbid alcohol abuse/dependence Alcohol/Substance Abuse/Dependencies  COGNITIVE FEATURES THAT CONTRIBUTE TO RISK:  Closed-mindedness Polarized thinking Thought constriction (tunnel vision)    SUICIDE RISK:   Moderate:  Frequent suicidal ideation with limited intensity, and duration, some specificity in terms of plans, no associated intent, good self-control, limited dysphoria/symptomatology, some risk factors present, and identifiable protective factors, including available and accessible social support.  PLAN OF CARE: Supportive approach/coing skills/relapse prevention                               Assess for detox needs                               Reassess co morbidities/optimize treatment with psychotropics  I certify that inpatient services furnished can reasonably be expected to improve the patient's condition.  Lorren Splawn A 11/23/2012, 10:40 AM

## 2012-11-24 MED ORDER — BENZOCAINE 10 % MT GEL
Freq: Three times a day (TID) | OROMUCOSAL | Status: DC | PRN
  Administered 2012-11-24: 21:00:00 via OROMUCOSAL
  Filled 2012-11-24: qty 9.4

## 2012-11-24 MED ORDER — LORAZEPAM 2 MG/ML IJ SOLN
1.0000 mg | Freq: Once | INTRAMUSCULAR | Status: AC
  Administered 2012-11-24: 1 mg via INTRAMUSCULAR
  Filled 2012-11-24: qty 1

## 2012-11-24 MED ORDER — ONDANSETRON HCL 4 MG/2ML IJ SOLN
4.0000 mg | Freq: Once | INTRAMUSCULAR | Status: AC
  Administered 2012-11-24: 4 mg via INTRAMUSCULAR
  Filled 2012-11-24 (×2): qty 2

## 2012-11-24 NOTE — Progress Notes (Signed)
Patient ID: [name], male   DOB: October 24, 1980, 32 y.o.   MRN: [id number] D: Patient stated he is doing well except for a toothache. Pt c/o of dehydration from vomiting earlier today. Patient denies SI/HI/AVH.  Pt presented with depressed mood and flat affect. Pt denies any needs or concerns.  Cooperative with assessment. No acute distressed noted at this time.   A: Met with pt 1:1. Writer encourage pt to drink more fluids. Medications administered as prescribed. Writer encouraged pt to discuss feelings. Pt encouraged to come to staff with any question or concerns. 15 minutes checks for safety.  R: Patient remains safe. He is complaint with medications and denies any adverse reaction. Continue current POC.
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
## 2012-11-24 NOTE — Progress Notes (Signed)
D: Patient appropriate and cooperative with staff. [name] affect/mood is depressed. He reported on the self inventory sheet that he slept well after he requested sleep aide medication, appetite is improving, energy level is normal and ability to pay attention is good. Patient rated depression and feelings of hopelessness "9".  A: Support and encouragement provided to patient. Administered scheduled medications per ordering MD. Monitor Q15 minute checks for safety.  R: Patient receptive. Denies SI/HI/AVH. Patient remains safe on the unit.

## 2012-11-24 NOTE — Progress Notes (Signed)
[facility] MD Progress Note  11/24/2012 5:09 PM [name]  MRN:  [id number] Subjective:  [name] endorses that he knows what he needs to do to be healthy. He states he needed to come here to be back on his medications and get back on track. States that he is going to look for a job, and get further schooling as he was planning to do before and stay away from people who can influence his going back to using. Diagnosis:  Major Depression recurrent, PTSD, Anxiety Disorder NOS, Alcohol abuse  ADL's:  Intact  Sleep: Fair  Appetite:  Fair  Suicidal Ideation:  Plan:  denies Intent:  denies Means:  denies Homicidal Ideation:  Plan:  denies Intent:  denies Means:  denies AEB (as evidenced by):  Psychiatric Specialty Exam: Review of Systems  Constitutional: Negative.   HENT:       Tooth ache  Eyes: Negative.   Respiratory: Negative.   Cardiovascular: Negative.   Gastrointestinal: Negative.   Musculoskeletal: Negative.   Skin: Negative.   Neurological: Negative.   Endo/Heme/Allergies: Negative.   Psychiatric/Behavioral: Positive for depression and substance abuse. The patient is nervous/anxious.     Blood pressure 138/98, pulse 99, temperature 98.6 F (37 C), temperature source Oral, resp. rate 16, height 5\' 6"  (1.676 m), weight 83.008 kg (183 lb).Body mass index is 29.55 kg/(m^2).  General Appearance: Fairly Groomed  Patent attorney::  Fair  Speech:  Clear and Coherent and Slow  Volume:  Decreased  Mood:  Anxious and worried  Affect:  anxious, worried  Thought Process:  Coherent and Goal Directed  Orientation:  Full (Time, Place, and Person)  Thought Content:  worries, concerns  Suicidal Thoughts:  No  Homicidal Thoughts:  No  Memory:  Immediate;   Fair Recent;   Fair Remote;   Fair  Judgement:  Fair  Insight:  Shallow  Psychomotor Activity:  Restlessness  Concentration:  Fair  Recall:  Fair  Akathisia:  No  Handed:  Right  AIMS (if indicated):     Assets:  Desire for  Improvement Housing Vocational/Educational  Sleep:  Number of Hours: 5.25   Current Medications: Current Facility-Administered Medications  Medication Dose Route Frequency Provider Last Rate Last Dose  . acetaminophen (TYLENOL) tablet 650 mg  650 mg Oral Q6H PRN Kerry Hough, PA-C   650 mg at 11/24/12 1450  . alum & mag hydroxide-simeth (MAALOX/MYLANTA) 200-200-20 MG/5ML suspension 30 mL  30 mL Oral Q4H PRN Kerry Hough, PA-C      . amLODipine (NORVASC) tablet 10 mg  10 mg Oral Daily Kerry Hough, PA-C   10 mg at 11/24/12 0805  . ARIPiprazole (ABILIFY) tablet 2 mg  2 mg Oral Daily Kerry Hough, PA-C   2 mg at 11/24/12 0805  . atorvastatin (LIPITOR) tablet 40 mg  40 mg Oral Daily Kerry Hough, PA-C   40 mg at 11/24/12 0805  . chlordiazePOXIDE (LIBRIUM) capsule 25 mg  25 mg Oral Q6H PRN Kerry Hough, PA-C      . chlordiazePOXIDE (LIBRIUM) capsule 25 mg  25 mg Oral TID Kerry Hough, PA-C   25 mg at 11/24/12 1705   Followed by  . [START ON 11/25/2012] chlordiazePOXIDE (LIBRIUM) capsule 25 mg  25 mg Oral BH-qamhs Spencer E Simon, PA-C       Followed by  . [START ON 11/27/2012] chlordiazePOXIDE (LIBRIUM) capsule 25 mg  25 mg Oral Daily Kerry Hough, PA-C      .  DULoxetine (CYMBALTA) DR capsule 30 mg  30 mg Oral Daily Kerry Hough, PA-C   30 mg at 11/24/12 0804  . hydrOXYzine (ATARAX/VISTARIL) tablet 25 mg  25 mg Oral Q6H PRN Kerry Hough, PA-C      . loperamide (IMODIUM) capsule 2-4 mg  2-4 mg Oral PRN Kerry Hough, PA-C      . magnesium hydroxide (MILK OF MAGNESIA) suspension 30 mL  30 mL Oral Daily PRN Kerry Hough, PA-C      . multivitamin with minerals tablet 1 tablet  1 tablet Oral Daily Kerry Hough, PA-C   1 tablet at 11/24/12 0806  . ondansetron (ZOFRAN-ODT) disintegrating tablet 4 mg  4 mg Oral Q6H PRN Kerry Hough, PA-C   4 mg at 11/23/12 1948  . thiamine (B-1) injection 100 mg  100 mg Intramuscular Once [employer], PA-C      . thiamine  (VITAMIN B-1) tablet 100 mg  100 mg Oral Daily Kerry Hough, PA-C   100 mg at 11/24/12 0806  . traZODone (DESYREL) tablet 150 mg  150 mg Oral QHS Kerry Hough, PA-C   150 mg at 11/24/12 0147    Lab Results: No results found for this or any previous visit (from the past 48 hour(s)).  Physical Findings: AIMS: Facial and Oral Movements Muscles of Facial Expression: None, normal Lips and Perioral Area: None, normal Jaw: None, normal Tongue: None, normal,Extremity Movements Upper (arms, wrists, hands, fingers): None, normal Lower (legs, knees, ankles, toes): None, normal, Trunk Movements Neck, shoulders, hips: None, normal, Overall Severity Severity of abnormal movements (highest score from questions above): None, normal Incapacitation due to abnormal movements: None, normal [name] awareness of abnormal movements (rate only patient's report): No Awareness, Dental Status Current problems with teeth and/or dentures?: No Does patient usually wear dentures?: No  CIWA:  CIWA-Ar Total: 4 COWS:     Treatment Plan Summary: Daily contact with patient to assess and evaluate symptoms and progress in treatment Medication management  Plan: Supportive approach/coping skills/relapse prevention           Pursue and complete the detox           Optimize treatment for his co morbidities           Consider D/C in AM  Medical Decision Making Problem Points:  Review of psycho-social stressors (1) Data Points:  Review of medication regiment & side effects (2)  I certify that inpatient services furnished can reasonably be expected to improve the patient's condition.   Doris Gruhn A 11/24/2012, 5:09 PM
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
## 2012-11-24 NOTE — Progress Notes (Signed)
[facility] Adult Case Management Discharge Plan :  Will you be returning to the same living situation after discharge: Yes,  no patient working to get into [facility] or [facility] At discharge, do you have transportation home?:Yes,  bus pass Do you have the ability to pay for your medications:Yes,  and given perscription discount card  Release of information consent forms completed and in the chart;  [name] signature needed at discharge.  Patient to Follow up at: Follow-up Information   Follow up with [facility] On 11/27/2012. (Please arrive Monday at 8:00am to be seen.  Can been seen from 8-3pm any day of the week M-F.  This is for follow up with medication and therapy if desired.)    Contact information:   [address]  [place], [place] [address] [phone]      Patient denies SI/HI:   Yes,  no reports per patient 5/9    Safety Planning and Suicide Prevention discussed:  Yes,  only with patient  Nail, Catalina Gravel 11/24/2012, 5:07 PM

## 2012-11-25 DIAGNOSIS — F411 Generalized anxiety disorder: Secondary | ICD-10-CM

## 2012-11-25 DIAGNOSIS — F339 Major depressive disorder, recurrent, unspecified: Principal | ICD-10-CM

## 2012-11-25 DIAGNOSIS — F1010 Alcohol abuse, uncomplicated: Secondary | ICD-10-CM

## 2012-11-25 MED ORDER — TRAZODONE HCL 150 MG PO TABS: 150.0000 mg | ORAL_TABLET | Freq: Every evening | ORAL | Status: DC | PRN

## 2012-11-25 NOTE — Discharge Instructions (Signed)
Attend 90 meetings in 90 days. Get trusted sponsor from the advise of others or from whomever in meetings seems to make sense, has a proven track record, will hold you responsible for your sobriety, and both expects and insists on total abstinence.  Work the steps HONESTLY with the trusted sponsor. Get obsessed with your recovery by often reminding yourself of how DEADLY this dredged horrible disease of addiction JUST IS.  The addictions simply want you dead and that's it.  DO YOU WANT TO BE ALIVE MORE?  Stay with your Higher Power.  The Higher Power never leaves. You do. Focus the first month on speaker meetings where you specifically look at how your life has been wrecked by drugs/alcohol and how your life has been similar to that of the speakers.  Take care of yourself.  No one else is standing up to do the job and only you know what you need.   GET SERIOUS about taking care of yourself.  Do the next right thing and that often means doing something to care for yourself along the lines of are you hungry, are you angry, are you lonely, are you tired, are you scared?  HALTS is what that stands for.  Take all your medications as prescribed by your mental healthcare provider. Report any adverse effects and or reactions from your medicines to your outpatient provider promptly. Patient is instructed and cautioned to not engage in alcohol and or illegal drug use while on prescription medicines. In the event of worsening symptoms, patient is instructed to call the crisis hotline, 911 and or go to the nearest ED for appropriate evaluation and treatment of symptoms. Follow-up with your primary care provider for your other medical issues, concerns and or health care needs.

## 2012-11-25 NOTE — Progress Notes (Signed)
Psychoeducational Group Note  Date:  11/25/2012 Time:  0945 am  Group Topic/Focus:  Identifying Needs:   The focus of this group is to help patients identify their personal needs that have been historically problematic and identify healthy behaviors to address their needs.  Participation Level:  Did Not Attend  [name] 11/25/2012,3:14 PM

## 2012-11-25 NOTE — Progress Notes (Signed)
Adult Psychoeducational Group Note  Date:  11/25/2012 Time:  0900  Group Topic/Focus:  Recovery Goals:   The focus of this group is to identify appropriate goals for recovery and establish a plan to achieve them.  Participation Level:  Did Not Attend  Pt chose not to attend group.  Alvaro Aungst Shari Prows 11/25/2012, 10:55 AM

## 2012-11-25 NOTE — Progress Notes (Signed)
[facility] MD Progress Note  11/25/2012 12:53 PM Chett Taniguchi  MRN:  [id number] Subjective:  [name] asks about leaving the hospital today.  He reports that he has learned to not blame others for his problems.  Apparently this is new.  He reports his depression, anxiety, and PTSD symptoms are all 0 today.  He has a sponsor on the outside and had gotten to step 3, but agrees that he [place] back at step 1 after his 1 day relapse.  Will encourage him to learn more from the meeting tonight. Diagnosis:  Major Depression recurrent, PTSD, Anxiety Disorder NOS, Alcohol abuse  ADL's:  Intact  Sleep: Good  Appetite:  Good  Suicidal Ideation:  Plan:  denies Intent:  denies Means:  denies Homicidal Ideation:  Plan:  denies Intent:  denies Means:  denies AEB (as evidenced by): per pt report  Psychiatric Specialty Exam: Review of Systems  Constitutional: Negative.   HENT:       Tooth ache  Eyes: Negative.   Respiratory: Negative.   Cardiovascular: Negative.   Gastrointestinal: Negative.   Musculoskeletal: Negative.   Skin: Negative.   Neurological: Negative.   Endo/Heme/Allergies: Negative.   Psychiatric/Behavioral: Positive for depression and substance abuse. The patient is nervous/anxious.     Blood pressure 128/87, pulse 114, temperature 98.4 F (36.9 C), temperature source Oral, resp. rate 16, height 5\' 6"  (1.676 m), weight 83.008 kg (183 lb).Body mass index is 29.55 kg/(m^2).  General Appearance: Fairly Groomed  Patent attorney::  Fair  Speech:  Clear and Coherent and Slow  Volume:  Decreased  Mood:  Anxious and worried  Affect:  anxious, worried  Thought Process:  Coherent and Goal Directed  Orientation:  Full (Time, Place, and Person)  Thought Content:  worries, concerns  Suicidal Thoughts:  No  Homicidal Thoughts:  No  Memory:  Immediate;   Fair Recent;   Fair Remote;   Fair  Judgement:  Fair  Insight:  Shallow  Psychomotor Activity:  Restlessness  Concentration:  Fair  Recall:   Fair  Akathisia:  No  Handed:  Right  AIMS (if indicated):     Assets:  Desire for Improvement Housing Vocational/Educational  Sleep:  Number of Hours: 5.5   Current Medications: Current Facility-Administered Medications  Medication Dose Route Frequency Provider Last Rate Last Dose  . acetaminophen (TYLENOL) tablet 650 mg  650 mg Oral Q6H PRN Kerry Hough, PA-C   650 mg at 11/24/12 1450  . alum & mag hydroxide-simeth (MAALOX/MYLANTA) 200-200-20 MG/5ML suspension 30 mL  30 mL Oral Q4H PRN Kerry Hough, PA-C      . amLODipine (NORVASC) tablet 10 mg  10 mg Oral Daily Kerry Hough, PA-C   10 mg at 11/25/12 0855  . ARIPiprazole (ABILIFY) tablet 2 mg  2 mg Oral Daily Kerry Hough, PA-C   2 mg at 11/25/12 0855  . atorvastatin (LIPITOR) tablet 40 mg  40 mg Oral Daily Kerry Hough, PA-C   40 mg at 11/25/12 0855  . benzocaine (ORAJEL) 10 % mucosal gel   Mouth/Throat TID PRN Rachael Fee, MD      . chlordiazePOXIDE (LIBRIUM) capsule 25 mg  25 mg Oral Q6H PRN Kerry Hough, PA-C      . chlordiazePOXIDE (LIBRIUM) capsule 25 mg  25 mg Oral BH-qamhs Kerry Hough, PA-C       Followed by  . [START ON 11/27/2012] chlordiazePOXIDE (LIBRIUM) capsule 25 mg  25 mg Oral Daily Kerry Hough,  PA-C      . DULoxetine (CYMBALTA) DR capsule 30 mg  30 mg Oral Daily Kerry Hough, PA-C   30 mg at 11/25/12 1610  . hydrOXYzine (ATARAX/VISTARIL) tablet 25 mg  25 mg Oral Q6H PRN Kerry Hough, PA-C      . loperamide (IMODIUM) capsule 2-4 mg  2-4 mg Oral PRN Kerry Hough, PA-C      . magnesium hydroxide (MILK OF MAGNESIA) suspension 30 mL  30 mL Oral Daily PRN Kerry Hough, PA-C      . multivitamin with minerals tablet 1 tablet  1 tablet Oral Daily Kerry Hough, PA-C   1 tablet at 11/25/12 0855  . ondansetron (ZOFRAN-ODT) disintegrating tablet 4 mg  4 mg Oral Q6H PRN Kerry Hough, PA-C   4 mg at 11/23/12 1948  . thiamine (B-1) injection 100 mg  100 mg Intramuscular Once [employer],  PA-C      . thiamine (VITAMIN B-1) tablet 100 mg  100 mg Oral Daily Kerry Hough, PA-C   100 mg at 11/25/12 0855  . traZODone (DESYREL) tablet 150 mg  150 mg Oral QHS Kerry Hough, PA-C   150 mg at 11/24/12 2148    Lab Results: No results found for this or any previous visit (from the past 48 hour(s)).  Physical Findings: AIMS: Facial and Oral Movements Muscles of Facial Expression: None, normal Lips and Perioral Area: None, normal Jaw: None, normal Tongue: None, normal,Extremity Movements Upper (arms, wrists, hands, fingers): None, normal Lower (legs, knees, ankles, toes): None, normal, Trunk Movements Neck, shoulders, hips: None, normal, Overall Severity Severity of abnormal movements (highest score from questions above): None, normal Incapacitation due to abnormal movements: None, normal [name] awareness of abnormal movements (rate only patient's report): No Awareness, Dental Status Current problems with teeth and/or dentures?: No Does patient usually wear dentures?: No  CIWA:  CIWA-Ar Total: 3 COWS:     Treatment Plan Summary: Daily contact with patient to assess and evaluate symptoms and progress in treatment Medication management  Plan: Supportive approach/coping skills/relapse prevention           Pursue and complete the detox           Optimize treatment for his co morbidities           Consider D/C in AM  Medical Decision Making Problem Points:  Established problem, stable/improving (1), Review of last therapy session (1) and Review of psycho-social stressors (1) Data Points:  Review of medication regiment & side effects (2)  I certify that inpatient services furnished can reasonably be expected to improve the patient's condition.   Jahmil Macleod 11/25/2012, 12:53 PM

## 2012-11-25 NOTE — [facility] Suicide Risk Assessment (Signed)
Suicide Risk Assessment  Discharge Assessment     Current Mental Status by Physician: Patient denies suicidal or homicidal ideation, hallucinations, illusions, or delusions. Patient engages with good eye contact, is able to focus adequately in a one to one setting, and has clear goal directed thoughts. Patient speaks with a natural conversational volume, rate, and tone. Anxiety was reported at 0 on a scale of 1 the least and 10 the most. Depression was reported at 0 on the same scale. Patient is oriented times 4, recent and remote memory intact. Judgement: limited by his mental illness and his addictive thinking. Insight: limited by his mental illness and his addictive thinking.  Demographic factors:  Male;Low socioeconomic status;Living alone;Unemployed Loss Factors:  Decrease in vocational status;Financial problems / change in socioeconomic status Historical Factors:  Family history of mental illness or substance abuse Risk Reduction Factors:  Sense of responsibility to family;Positive social support  Continued Clinical Symptoms:  Bipolar Disorder:   Bipolar II Alcohol/Substance Abuse/Dependencies  Discharge Diagnoses: AXIS I:  Alcohol Abuse, Anxiety Disorder NOS, Major Depression, Recurrent severe and Post Traumatic Stress Disorder AXIS II:  Deferred AXIS III:   Past Medical History  Diagnosis Date  . Seizures   . Hypertension   . Depression   . Pancreatitis   . Liver cirrhosis   . Coronary artery disease   . Angina   . Shortness of breath   . Headache   . Peripheral vascular disease April 2011    Left Pop  . Hypercholesteremia    AXIS IV:  other psychosocial or environmental problems AXIS V:  61-70 mild symptoms  Cognitive Features That Contribute To Risk:  Closed-mindedness    Suicide Risk:  Minimal: No identifiable suicidal ideation.  Patients presenting with no risk factors but with morbid ruminations; may be classified as minimal risk based on the severity of  the depressive symptoms  Labs:  No results found for this or any previous visit (from the past 72 hour(s)). RISK REDUCTION FACTORS: What pt has learned from hospital stay is that I must take responsibility for my actions and that he can not have even one drink  Risk of self harm is elevated by his history of 5 prior suicide attempts, but he has himself to live for and a new job.  Risk of harm to others is minimal in that he has not been involved in fights or had any legal charges filed on him.  Pt seen in treatment team where he confessed all of these above findings.  PLAN: Discharge home Continue   Medication List    TAKE these medications     Indication   amLODipine 10 MG tablet  Commonly known as:  NORVASC  Take 1 tablet (10 mg total) by mouth daily. For blood pressure   Indication:  High Blood Pressure     ARIPiprazole 2 MG tablet  Commonly known as:  ABILIFY  Take 1 tablet (2 mg total) by mouth daily. For mood stabilization   Indication:  Major Depressive Disorder     atorvastatin 40 MG tablet  Commonly known as:  LIPITOR  Take 1 tablet (40 mg total) by mouth daily. For high cholesterol   Indication:  hyperlipidemia     DULoxetine 30 MG capsule  Commonly known as:  CYMBALTA  Take 1 capsule (30 mg total) by mouth daily.   Indication:  Major Depressive Disorder     traZODone 150 MG tablet  Commonly known as:  DESYREL  Take 1 tablet (150  mg total) by mouth at bedtime as needed. For sleep   Indication:  insomnia       Follow-up recommendations:  Activities: Resume typical activities Diet: Resume typical diet Tests: none Other: Follow up with outpatient provider and report any side effects to out patient prescriber.  Is patient on multiple antipsychotic therapies at discharge:  No  Has Patient had three or more failed trials of antipsychotic monotherapy by history: N/A Recommended Plan for Multiple Antipsychotic Therapies: N/A  [name] 11/25/2012 3:36  PM

## 2012-11-25 NOTE — Progress Notes (Signed)
Patient ID: [name], male   DOB: June 25, 1981, 32 y.o.   MRN: [id number] Writer reviewed pt discharge instructions with pt including medications, follow up care and crisis intervention. Pt acknowledged understanding of instructions and states that he has no reservations about leaving [facility] at this time. Pt denies SI/HI and AVH. Pt mood and affect are appropriate to the situation. Writer returned pt belongings from locker, and the pt is released into his own care.

## 2012-11-25 NOTE — [facility] Group Notes (Signed)
[facility] Group Notes: (Clinical Social Work)   11/25/2012      Type of Therapy:  Group Therapy   Participation Level:  Did Not Attend    Ambrose Mantle, LCSW 11/25/2012, 11:20 AM

## 2012-11-27 NOTE — Discharge Summary (Signed)
Physician Discharge Summary Note  Patient:  [name] is an 32 y.o., male MRN:  [id number] DOB:  12-27-1980 Patient phone:  [phone] (home)  Patient address:   [address] [place] [address]   Date of Admission:  11/22/2012 Date of Discharge: 11/25/2012  Discharge Diagnoses: Active Problems:   SUBSTANCE ABUSE, MULTIPLE   Major depressive disorder, recurrent episode   Anxiety disorder   PTSD (post-traumatic stress disorder)  Axis Diagnosis:  Axis I: Major Depression recurrent, PTSD, Anxiety Disorder NOS, Alcohol abuse  Axis II: Deferred Axis:  Past Medical History  Diagnosis Date  . Seizures   . Hypertension   . Depression   . Pancreatitis   . Liver cirrhosis   . Coronary artery disease   . Angina   . Shortness of breath   . Headache   . Peripheral vascular disease April 2011    Left Pop  . Hypercholesteremia   Axis IV: Moderate: housing, psychosocial stressors Axis V: 50-59  Level of Care:  OP  Hospital Course:   Lost his job, he is homeless, staying here and there. AA sponsor died last month. States this was very stressful. He went back drinking. Sates he had been "clean" for 8 months. He went in a two day binge and felt depressed and tried to kill himself with Advil PM. He changed his mind and came here.  While a patient in this hospital, Miko Sirico was enrolled in group counseling and activities as well as received the following medications:amLODipine (NORVASC) 10 MG tablet, Take 1 tablet (10 mg total) by mouth daily. For blood pressure, Disp: 30 tablet, Rfl: 0;  ARIPiprazole (ABILIFY) 2 MG tablet, Take 1 tablet (2 mg total) by mouth daily. For mood stabilization, Disp: 30 tablet, Rfl: 0;  atorvastatin (LIPITOR) 40 MG tablet, Take 1 tablet (40 mg total) by mouth daily. For high cholesterol, Disp: , Rfl:  DULoxetine (CYMBALTA) 30 MG capsule, Take 1 capsule (30 mg total) by mouth daily., Disp: 30 capsule, Rfl: 0;  traZODone (DESYREL) 150 MG tablet, Take 1  tablet (150 mg total) by mouth at bedtime as needed. For sleep, Disp: 30 tablet, Rfl: 0;  [DISCONTINUED] pantoprazole (PROTONIX) 40 MG tablet, Take 1 tablet (40 mg total) by mouth daily., Disp: 30 tablet, Rfl: 0  Patient attended treatment team meeting yesterday am and met with treatment team members. Pt symptoms, treatment plan and response to treatment discussed. Vinal Rosengrant endorsed that their symptoms have improved. Pt also stated that they are stable for discharge.  In other to control Active Problems:   SUBSTANCE ABUSE, MULTIPLE   Major depressive disorder, recurrent episode   Anxiety disorder   PTSD (post-traumatic stress disorder) , they will continue psychiatric care on outpatient basis. They will follow-up at  Follow-up Information   Follow up with [facility] On 11/27/2012. (Please arrive Monday at 8:00am to be seen.  Can been seen from 8-3pm any day of the week M-F.  This is for follow up with medication and therapy if desired.)    Contact information:   [address]  [place], [place] [address] [phone]    .  In addition they were instructed to take all your medications as prescribed by your mental healthcare provider, to report any adverse effects and or reactions from your medicines to your outpatient provider promptly, patient is instructed and cautioned to not engage in alcohol and or illegal drug use while on prescription medicines, in the event of worsening symptoms, patient is instructed to  call the crisis hotline, 911 and or go to the nearest ED for appropriate evaluation and treatment of symptoms.   Upon discharge, patient adamantly denies suicidal, homicidal ideations, auditory, visual hallucinations and or delusional thinking. They left [facility] with all personal belongings in no apparent distress.  Consults:  See electronic record for details  Significant Diagnostic Studies:  See electronic record for details  Discharge Vitals:   Blood pressure 129/93, pulse 109,  temperature 98.4 F (36.9 C), temperature source Oral, resp. rate 16, height 5\' 6"  (1.676 m), weight 83.008 kg (183 lb)..  Mental Status Exam: See Mental Status Examination and Suicide Risk Assessment completed by Attending Physician prior to discharge.  Discharge destination:  Other:  [facility]  Is patient on multiple antipsychotic therapies at discharge:  No  Has Patient had three or more failed trials of antipsychotic monotherapy by history: N/A Recommended Plan for Multiple Antipsychotic Therapies: N/A  Future Appointments Provider Department Dept Phone   01/12/2013 3:00 PM [facility] [phone]   01/12/2013 3:30 PM [facility] [phone]   01/12/2013 4:00 PM Evern Bio, NP [facility] [phone]       Medication List    TAKE these medications     Indication   amLODipine 10 MG tablet  Commonly known as:  NORVASC  Take 1 tablet (10 mg total) by mouth daily. For blood pressure   Indication:  High Blood Pressure     ARIPiprazole 2 MG tablet  Commonly known as:  ABILIFY  Take 1 tablet (2 mg total) by mouth daily. For mood stabilization   Indication:  Major Depressive Disorder     atorvastatin 40 MG tablet  Commonly known as:  LIPITOR  Take 1 tablet (40 mg total) by mouth daily. For high cholesterol   Indication:  hyperlipidemia     DULoxetine 30 MG capsule  Commonly known as:  CYMBALTA  Take 1 capsule (30 mg total) by mouth daily.   Indication:  Major Depressive Disorder     traZODone 150 MG tablet  Commonly known as:  DESYREL  Take 1 tablet (150 mg total) by mouth at bedtime as needed. For sleep   Indication:  insomnia           Follow-up Information   Follow up with [facility] On 11/27/2012. (Please arrive Monday at 8:00am to be seen.  Can been seen from 8-3pm any day of the week M-F.  This is for follow up with medication and therapy if  desired.)    Contact information:   [address]  [place], [place] [address] [phone]     Follow-up recommendations:   Activities: Resume typical activities Diet: Resume typical diet Tests: none Other: Follow up with outpatient provider and report any side effects to out patient prescriber.  Comments:  Take all your medications as prescribed by your mental healthcare provider. Report any adverse effects and or reactions from your medicines to your outpatient provider promptly. Patient is instructed and cautioned to not engage in alcohol and or illegal drug use while on prescription medicines. In the event of worsening symptoms, patient is instructed to call the crisis hotline, 911 and or go to the nearest ED for appropriate evaluation and treatment of symptoms. Follow-up with your primary care provider for your other medical issues, concerns and or health care needs.  SignedOrson Aloe 11/27/2012 9:21 PM

## 2012-11-29 NOTE — Progress Notes (Signed)
Patient Discharge Instructions:  After Visit Summary (AVS):   Faxed to:  11/29/12 Discharge Summary Note:   Faxed to:  11/29/12 Psychiatric Admission Assessment Note:   Faxed to:  11/29/12 Suicide Risk Assessment - Discharge Assessment:   Faxed to:  11/29/12 Faxed/Sent to the Next Level Care provider:  11/29/12 Faxed to [facility] @ [phone]  Jerelene Redden, 11/29/2012, 1:58 PM

## 2012-11-30 ENCOUNTER — Emergency Department (HOSPITAL_COMMUNITY): Payer: Self-pay

## 2012-11-30 ENCOUNTER — Encounter (HOSPITAL_COMMUNITY): Payer: Self-pay | Admitting: Emergency Medicine

## 2012-11-30 ENCOUNTER — Emergency Department (HOSPITAL_COMMUNITY)
Admission: EM | Admit: 2012-11-30 | Discharge: 2012-11-30 | Disposition: A | Discharge status: Home / Self Care | Payer: Self-pay | Attending: Emergency Medicine | Admitting: Emergency Medicine

## 2012-11-30 ENCOUNTER — Encounter (HOSPITAL_COMMUNITY): Payer: Self-pay | Admitting: Cardiology

## 2012-11-30 DIAGNOSIS — F319 Bipolar disorder, unspecified: Secondary | ICD-10-CM | POA: Insufficient documentation

## 2012-11-30 DIAGNOSIS — Z23 Encounter for immunization: Secondary | ICD-10-CM | POA: Insufficient documentation

## 2012-11-30 DIAGNOSIS — E7800 Pure hypercholesterolemia, unspecified: Secondary | ICD-10-CM | POA: Insufficient documentation

## 2012-11-30 DIAGNOSIS — Z8679 Personal history of other diseases of the circulatory system: Secondary | ICD-10-CM | POA: Insufficient documentation

## 2012-11-30 DIAGNOSIS — S0180XA Unspecified open wound of other part of head, initial encounter: Secondary | ICD-10-CM | POA: Insufficient documentation

## 2012-11-30 DIAGNOSIS — Z79899 Other long term (current) drug therapy: Secondary | ICD-10-CM | POA: Insufficient documentation

## 2012-11-30 DIAGNOSIS — Z880 Allergy status to penicillin: Secondary | ICD-10-CM | POA: Insufficient documentation

## 2012-11-30 DIAGNOSIS — F411 Generalized anxiety disorder: Secondary | ICD-10-CM | POA: Insufficient documentation

## 2012-11-30 DIAGNOSIS — F3289 Other specified depressive episodes: Secondary | ICD-10-CM | POA: Insufficient documentation

## 2012-11-30 DIAGNOSIS — T148XXA Other injury of unspecified body region, initial encounter: Secondary | ICD-10-CM

## 2012-11-30 DIAGNOSIS — F17200 Nicotine dependence, unspecified, uncomplicated: Secondary | ICD-10-CM | POA: Insufficient documentation

## 2012-11-30 DIAGNOSIS — Z8719 Personal history of other diseases of the digestive system: Secondary | ICD-10-CM | POA: Insufficient documentation

## 2012-11-30 DIAGNOSIS — I2510 Atherosclerotic heart disease of native coronary artery without angina pectoris: Secondary | ICD-10-CM | POA: Insufficient documentation

## 2012-11-30 DIAGNOSIS — F4310 Post-traumatic stress disorder, unspecified: Secondary | ICD-10-CM | POA: Insufficient documentation

## 2012-11-30 DIAGNOSIS — IMO0002 Reserved for concepts with insufficient information to code with codable children: Secondary | ICD-10-CM | POA: Insufficient documentation

## 2012-11-30 DIAGNOSIS — I10 Essential (primary) hypertension: Secondary | ICD-10-CM | POA: Insufficient documentation

## 2012-11-30 DIAGNOSIS — S0990XA Unspecified injury of head, initial encounter: Secondary | ICD-10-CM | POA: Insufficient documentation

## 2012-11-30 DIAGNOSIS — G40909 Epilepsy, unspecified, not intractable, without status epilepticus: Secondary | ICD-10-CM | POA: Insufficient documentation

## 2012-11-30 DIAGNOSIS — Z8669 Personal history of other diseases of the nervous system and sense organs: Secondary | ICD-10-CM | POA: Insufficient documentation

## 2012-11-30 DIAGNOSIS — F329 Major depressive disorder, single episode, unspecified: Secondary | ICD-10-CM | POA: Insufficient documentation

## 2012-11-30 LAB — CBC
MCV: 81.9 fL (ref 78.0–100.0)
Platelets: 206 10*3/uL (ref 150–400)
RBC: 5.63 MIL/uL (ref 4.22–5.81)
RDW: 16.4 % — ABNORMAL HIGH (ref 11.5–15.5)
WBC: 7 10*3/uL (ref 4.0–10.5)

## 2012-11-30 LAB — BASIC METABOLIC PANEL
CO2: 24 mEq/L (ref 19–32)
Calcium: 8.6 mg/dL (ref 8.4–10.5)
Chloride: 105 mEq/L (ref 96–112)
Creatinine, Ser: 1.11 mg/dL (ref 0.50–1.35)
GFR calc Af Amer: >90 mL/min (ref >90)
Sodium: 142 mEq/L (ref 135–145)

## 2012-11-30 LAB — ETHANOL: Alcohol, Ethyl (B): 239 mg/dL — ABNORMAL HIGH (ref 0–11)

## 2012-11-30 LAB — RAPID URINE DRUG SCREEN, HOSP PERFORMED
Amphetamines: NOT DETECTED
Benzodiazepines: POSITIVE — AB
Opiates: NOT DETECTED

## 2012-11-30 LAB — SALICYLATE LEVEL: Salicylate Lvl: <2 mg/dL — ABNORMAL LOW (ref 2.8–20.0)

## 2012-11-30 LAB — ACETAMINOPHEN LEVEL: Acetaminophen (Tylenol), Serum: <15 ug/mL (ref 10–30)

## 2012-11-30 MED ORDER — NICOTINE 21 MG/24HR TD PT24: 21.0000 mg | MEDICATED_PATCH | Freq: Every day | TRANSDERMAL | Status: DC

## 2012-11-30 MED ORDER — LORAZEPAM 2 MG/ML IJ SOLN: 1.0000 mg | Freq: Four times a day (QID) | INTRAMUSCULAR | Status: DC | PRN

## 2012-11-30 MED ORDER — PHENYTOIN SODIUM EXTENDED 100 MG PO CAPS
100.0000 mg | ORAL_CAPSULE | Freq: Every day | ORAL | Status: DC
  Administered 2012-11-30: 100 mg via ORAL
  Filled 2012-11-30 (×2): qty 1

## 2012-11-30 MED ORDER — ACETAMINOPHEN 325 MG PO TABS: 650.0000 mg | ORAL_TABLET | ORAL | Status: DC | PRN

## 2012-11-30 MED ORDER — ATORVASTATIN CALCIUM 40 MG PO TABS
40.0000 mg | ORAL_TABLET | Freq: Every day | ORAL | Status: DC
  Administered 2012-11-30: 40 mg via ORAL
  Filled 2012-11-30 (×2): qty 1

## 2012-11-30 MED ORDER — FOLIC ACID 1 MG PO TABS
1.0000 mg | ORAL_TABLET | Freq: Every day | ORAL | Status: DC
  Administered 2012-11-30: 1 mg via ORAL
  Filled 2012-11-30: qty 1

## 2012-11-30 MED ORDER — LORAZEPAM 1 MG PO TABS: 1.0000 mg | ORAL_TABLET | Freq: Three times a day (TID) | ORAL | Status: DC | PRN

## 2012-11-30 MED ORDER — DULOXETINE HCL 30 MG PO CPEP
30.0000 mg | ORAL_CAPSULE | Freq: Every day | ORAL | Status: DC
  Filled 2012-11-30 (×2): qty 1

## 2012-11-30 MED ORDER — TETANUS-DIPHTH-ACELL PERTUSSIS 5-2.5-18.5 LF-MCG/0.5 IM SUSP
0.5000 mL | Freq: Once | INTRAMUSCULAR | Status: AC
  Administered 2012-11-30: 0.5 mL via INTRAMUSCULAR
  Filled 2012-11-30: qty 0.5

## 2012-11-30 MED ORDER — AMLODIPINE BESYLATE 10 MG PO TABS
10.0000 mg | ORAL_TABLET | Freq: Every day | ORAL | Status: DC
Start: 2012-11-30 — End: 2012-11-30
  Administered 2012-11-30: 10 mg via ORAL
  Filled 2012-11-30 (×2): qty 1

## 2012-11-30 MED ORDER — DULOXETINE HCL 30 MG PO CPEP
30.0000 mg | ORAL_CAPSULE | Freq: Every day | ORAL | Status: DC
  Filled 2012-11-30: qty 1

## 2012-11-30 MED ORDER — LORAZEPAM 1 MG PO TABS: 0.0000 mg | ORAL_TABLET | Freq: Two times a day (BID) | ORAL | Status: DC

## 2012-11-30 MED ORDER — ZOLPIDEM TARTRATE 5 MG PO TABS: 5.0000 mg | ORAL_TABLET | Freq: Every evening | ORAL | Status: DC | PRN

## 2012-11-30 MED ORDER — THIAMINE HCL 100 MG/ML IJ SOLN: 100.0000 mg | Freq: Every day | INTRAMUSCULAR | Status: DC

## 2012-11-30 MED ORDER — TRAZODONE HCL 50 MG PO TABS: 150.0000 mg | ORAL_TABLET | Freq: Every evening | ORAL | Status: DC | PRN

## 2012-11-30 MED ORDER — TRAZODONE HCL 100 MG PO TABS: 100.0000 mg | ORAL_TABLET | Freq: Every day | ORAL | Status: DC

## 2012-11-30 MED ORDER — ADULT MULTIVITAMIN W/MINERALS CH
1.0000 | ORAL_TABLET | Freq: Every day | ORAL | Status: DC
  Administered 2012-11-30: 1 via ORAL
  Filled 2012-11-30: qty 1

## 2012-11-30 MED ORDER — ARIPIPRAZOLE 2 MG PO TABS
2.0000 mg | ORAL_TABLET | Freq: Every day | ORAL | Status: DC
  Administered 2012-11-30: 2 mg via ORAL
  Filled 2012-11-30 (×2): qty 1

## 2012-11-30 MED ORDER — LORAZEPAM 1 MG PO TABS: 1.0000 mg | ORAL_TABLET | Freq: Four times a day (QID) | ORAL | Status: DC | PRN

## 2012-11-30 MED ORDER — IBUPROFEN 600 MG PO TABS: 600.0000 mg | ORAL_TABLET | Freq: Four times a day (QID) | ORAL | Status: DC | PRN

## 2012-11-30 MED ORDER — ZIPRASIDONE HCL 40 MG PO CAPS: 40.0000 mg | ORAL_CAPSULE | Freq: Two times a day (BID) | ORAL | Status: DC

## 2012-11-30 MED ORDER — LORAZEPAM 1 MG PO TABS: 0.0000 mg | ORAL_TABLET | Freq: Four times a day (QID) | ORAL | Status: DC

## 2012-11-30 MED ORDER — VITAMIN B-1 100 MG PO TABS
100.0000 mg | ORAL_TABLET | Freq: Every day | ORAL | Status: DC
  Administered 2012-11-30: 100 mg via ORAL
  Filled 2012-11-30: qty 1

## 2012-11-30 MED ORDER — SODIUM CHLORIDE 0.9 % IV SOLN
INTRAVENOUS | Status: DC
  Administered 2012-11-30: 06:00:00 via INTRAVENOUS

## 2012-11-30 NOTE — ED Notes (Addendum)
Pt belongings placed in bin and logged into book. Pt resting with eyes closed at this time, responds to verbal stimuli. Sitter at bed side
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
## 2012-11-30 NOTE — ED Notes (Signed)
NAD noted at time of d/c home 

## 2012-11-30 NOTE — ED Notes (Signed)
PT comfortable with d/c and f/u isntructions. Pt verbalized understanding to get stitches removed in 5 days. Pt given bus pass and list of shelters/crisis centers in the area. Prescritpions x1
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
## 2012-11-30 NOTE — Discharge Instructions (Signed)
RESOURCE GUIDE  Chronic Pain Problems: Contact [facility]  [phone] Patients need to be referred by their primary care doctor.  Insufficient Money for Medicine: Contact United Way:  call "211."   No Primary Care Doctor: - Call Health Connect  [phone] - can help you locate a primary care doctor that  accepts your insurance, provides certain services, etc. - Physician Referral Service- [phone]  Agencies that provide inexpensive medical care: - [facility]  [phone] - [facility]  [phone] - [facility]  [phone] - Women's Clinic  [phone] - [facility]  [phone] [facility]  [phone]  Medicaid-accepting [facility] Providers: - [facility]- [address]  [phone], Mon-Fri 9am-7pm, Sat 9am-1pm - [facility]- [address] [place], [address] [place]  [phone] - [facility]- [address] [place]  [phone] [facility]- [address]  [phone] - Renaye Rakers- [address] [place], [address], [phone]  Only accepts [place] patients after they have their name  applied to their card  Self Pay (no insurance) in [place]: - Sickle Cell Patients - [facility]  [address] [place], [phone] - [facility]- [address] [place]  [phone]       [facility] [place]- [address]       -     [facility]- see information above (Speak to [employer] if you do not have insurance)       -  [facility]- [address],  [phone]       -  [facility]- [address] [phone]       -  Dr Julio Sicks-  [address], [place], [phone]       -  [facility] - [address] [phone]       -  [facility]- [address] [phone], also [address], [phone]       -     [facility]- [address] [place], [phone], 1st & 3rd Saturday         every month, 10am-1pm  -     [facility]   [address] [place], [place].   Phone:  [phone], Fax:  [phone]. Hours of Operation:  9 am - 6 pm, M-F.  -     [facility]   [address], [place]   Phone: [phone], Fax: [phone]. Hours of Operation:  8:30 am - 5:30 pm, M-F.  [facility] [address] [place], [place] [address] [phone]  [facility] [address] [place], [place] [address] [phone]  1) Find a Doctor and Pay Out of Pocket Although you won't have to find out who is covered by your insurance plan, it is a good idea to ask around and get recommendations. You will then need to call the office and see if the doctor you have chosen will accept you as a new patient and what types of options they offer for patients who are self-pay. Some doctors offer discounts or will set up payment plans for their patients who do not have insurance, but  you will need to ask so you aren't surprised when you get to your appointment.  2) Contact Your Local Health Department Not all health departments have doctors that can see patients for sick visits, but many do, so it is worth a call to see if yours does. If you don't know where your local health department is, you can check in your phone book. The CDC also has a tool to help you locate your state's health department, and many state websites also have listings of all of their local health departments.  3) Find a Walk-in Clinic If your illness is not likely to be very severe or complicated, you may want to try a walk in clinic. These are popping up all over the country in pharmacies, drugstores, and shopping centers. They're usually staffed by nurse practitioners or physician assistants that have been trained to  treat common illnesses and complaints. They're usually fairly quick and inexpensive. However, if you have serious medical issues or chronic medical problems, these are probably not your best option  STD Testing - [facility] Department of [facility], STD Clinic, [address] [place], phone [phone] or [phone].  Monday - Friday, call for an appointment. [facility] Department of [employer], STD Clinic, [address], [place], phone [phone] or [phone].  Monday - Friday, call for an appointment.  Abuse/Neglect: [facility] Child Abuse Hotline (781)365-3666 [facility] Child Abuse Hotline 989-511-2682 (After Hours)  Emergency Shelter:  [facility] 747-734-9423  Maternity Homes: - [facility] [place] [facility] [phone] - [facility] [phone]  MRSA Hotline #:   (628) 033-3694  Dental Assistance If unable to pay or uninsured, contact:  [facility]. to become qualified for the adult dental clinic.  Patients with Medicaid: [facility] [phone] [address], [phone] [address], [phone]  If unable to pay, or uninsured, contact [facility] [phone] in [place], [phone] in [facility]) to become qualified for the adult dental clinic  [facility] [address] [place], [place] [address] [phone] [email or web address]  Other Proofreader Services: - [facility]- [address] [place], [place], [place], [address], [phone], Ext. [phone], 2nd and 4th Thursday of the month at 6:30am.  10 clients each day by appointment, can sometimes see walk-in patients if someone does not show for an appointment. [facility]- [address] [place], [place], [address], [phone] - [facility] [address] [place], [place], [address], [phone] - [place]  Health Department- [phone] [facility] Health Department- [phone] [facility] Health [phone]       Behavioral Health Resources in the [facility]  Intensive Outpatient Programs: [facility]      [address] [place], [place] [phone] Both a day and evening program       [facility]     [address]        [place], [place] [address] [phone]         ADS: [facility] [address] [place] [phone]  [facility] Mental Health ACCESS LINE: 709-860-9867 or [phone] [address] [place], [place] [address] [email or web address]   Substance Abuse Resources: - [facility]  [phone] - [facility] [phone] - [facility] [phone] [facility] [phone] -  Residential & Outpatient Substance Abuse Program  800-659-3381 ° °Psychological Services: °- [facility]  [phone] °- [facility]  [phone] °- [facility], [address], [place], ACCESS LINE: 1-800-853-5163 or 336-641-4981, [email or web address].htm ° °Mobile Crisis Teams:         °                               °[facility]         °Mobile Crisis Care Unit °1-877-626-1772       °      °Assertive °[facility] °[address] [place] °336-834-9664 °                                        °Interventionist °Sharon DeEsch °[address] °[place] °336-554-5454 ° °Self-Help/Support Groups: °[facility] [place] Variety of support groups °373-1402 (call for more info) ° °Narcotics Anonymous (NA) °[facility] °[address] °[place] - 2 meetings at this location ° °Residential Treatment Programs:  °[facility]      °[address]        °[place]       °866-801-8205        ° °[facility] °[address] °[place], [place]  [address] °[phone] ° °[facility]  °[address] °[place], [place] [address] °336-845-3988 °Admissions: 8am-3pm M-F ° °[facility]     °[address]        °[place], [place] [address]       °336-841-1104        ° °[facility] °[address] °[place], [place] °336-379-7146 ° °[facility] °[address] °[place], [place] °336-285-9073 ° °[facility] - Intensive Outpatient      °[address]     °[place], [place]       °852-3033        ° °[facility]     °[address] °[place], [place] °877-615-2722 or 336-784-9470 ° °[facility] ([facility]), Medicaid °[address] °[place], [place] °336-227-7417 ° °[facility]                                               °[address] °[place] °800-659-3381 ° °[place] [facility]: °[facility]- 1-888-581-9988              ° °General Therapy                                                °Julie Brannon, PhD        °[address]                                       °[place], [place] [address]         °336-349-5553   °Insurance ° °[facility]   [facility]   °[address] °[place], [place] [address] °336-349-4454 ° °[facility] °[address] [place], [place] [address] °336-342-8316 °Insurance/Medicaid/sponsorship through [facility] ° °[facility]                                              °[address]                                        °[place], [place] [address]    °Therapy/tele-psych/case         °336-342-8316        °  °[facility] °[address]  ° [place], [place]  [address]  °Adolescent/group home/case management °336-349-2233  °                                         °Julia Brannon PhD       °General therapy       °Insurance   °336-951-0000        ° °Dr. Arfeen, Insurance, M-F °336- 349-4544 ° °[facility] [place]  [facility]  [employer] [facility] [facility] °[address]                 [address]         [address]  °[place]                                               [place]                              [place] °Phone:  [phone]                                  Phone:  [phone]                   Phone:  342-8140 ° °[facility], 342-8316 °- [place] [facility] - [facility]- 1-888-581-9988 °      -     [facility] in [place], [address], °            336-349-4454, Insurance ° °[place] Child Abuse Hotline °(336) 342-1394 or (336) 342-3537 (After Hours) ° ° °

## 2012-11-30 NOTE — ED Notes (Addendum)
After signing e-signature and agreeing to discharge, pt states "I am feeling suicidal."  Dr. Dierdre Highman, Dr. Ignacia Palma, and Greta Doom PA made aware, in agreement that pt. OK to discharge at this time. Pt discharged with crisis center, [facility], and shelter information.
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
## 2012-11-30 NOTE — ED Notes (Signed)
Pt still refusing AM meds. Pt lying in bed with eyes closed. Becomes agitated when try to wake pt. Pt responds to verbal stimuli.
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
## 2012-11-30 NOTE — ED Notes (Signed)
PT AGREEABLE TO TAKING MEDS NOW. MEDS THAT ARENT IN PYXIS HAVE BEEN ORDERED FROM PHARMACY

## 2012-11-30 NOTE — ED Notes (Addendum)
Charted pt disposition in error.

## 2012-12-04 ENCOUNTER — Encounter (HOSPITAL_COMMUNITY): Payer: Self-pay

## 2012-12-04 ENCOUNTER — Emergency Department (HOSPITAL_COMMUNITY)
Admission: EM | Admit: 2012-12-04 | Discharge: 2012-12-05 | Disposition: A | Discharge status: Home / Self Care | Payer: Self-pay | Attending: Emergency Medicine | Admitting: Emergency Medicine

## 2012-12-04 DIAGNOSIS — F1410 Cocaine abuse, uncomplicated: Secondary | ICD-10-CM | POA: Insufficient documentation

## 2012-12-04 DIAGNOSIS — F17200 Nicotine dependence, unspecified, uncomplicated: Secondary | ICD-10-CM | POA: Insufficient documentation

## 2012-12-04 DIAGNOSIS — E7800 Pure hypercholesterolemia, unspecified: Secondary | ICD-10-CM | POA: Insufficient documentation

## 2012-12-04 DIAGNOSIS — R45851 Suicidal ideations: Secondary | ICD-10-CM | POA: Insufficient documentation

## 2012-12-04 DIAGNOSIS — Z880 Allergy status to penicillin: Secondary | ICD-10-CM | POA: Insufficient documentation

## 2012-12-04 DIAGNOSIS — Z8709 Personal history of other diseases of the respiratory system: Secondary | ICD-10-CM | POA: Insufficient documentation

## 2012-12-04 DIAGNOSIS — F319 Bipolar disorder, unspecified: Secondary | ICD-10-CM | POA: Insufficient documentation

## 2012-12-04 DIAGNOSIS — I739 Peripheral vascular disease, unspecified: Secondary | ICD-10-CM | POA: Insufficient documentation

## 2012-12-04 DIAGNOSIS — F1010 Alcohol abuse, uncomplicated: Secondary | ICD-10-CM | POA: Insufficient documentation

## 2012-12-04 DIAGNOSIS — Z8719 Personal history of other diseases of the digestive system: Secondary | ICD-10-CM | POA: Insufficient documentation

## 2012-12-04 DIAGNOSIS — Z79899 Other long term (current) drug therapy: Secondary | ICD-10-CM | POA: Insufficient documentation

## 2012-12-04 DIAGNOSIS — I2510 Atherosclerotic heart disease of native coronary artery without angina pectoris: Secondary | ICD-10-CM | POA: Insufficient documentation

## 2012-12-04 DIAGNOSIS — I10 Essential (primary) hypertension: Secondary | ICD-10-CM | POA: Insufficient documentation

## 2012-12-04 LAB — POCT I-STAT TROPONIN I

## 2012-12-04 LAB — CBC WITH DIFFERENTIAL/PLATELET
Basophils Absolute: 0 10*3/uL (ref 0.0–0.1)
Basophils Relative: 0 % (ref 0–1)
Eosinophils Relative: 0 % (ref 0–5)
HCT: 43.2 % (ref 39.0–52.0)
Hemoglobin: 15.2 g/dL (ref 13.0–17.0)
Lymphocytes Relative: 33 % (ref 12–46)
MCHC: 35.2 g/dL (ref 30.0–36.0)
MCV: 82.3 fL (ref 78.0–100.0)
Monocytes Absolute: 0.5 10*3/uL (ref 0.1–1.0)
Monocytes Relative: 11 % (ref 3–12)
RDW: 16.2 % — ABNORMAL HIGH (ref 11.5–15.5)

## 2012-12-04 LAB — COMPREHENSIVE METABOLIC PANEL
AST: 47 U/L — ABNORMAL HIGH (ref 0–37)
BUN: 11 mg/dL (ref 6–23)
CO2: 32 mEq/L (ref 19–32)
Calcium: 9.4 mg/dL (ref 8.4–10.5)
Creatinine, Ser: 1.01 mg/dL (ref 0.50–1.35)
GFR calc non Af Amer: >90 mL/min (ref >90)
Total Bilirubin: 0.5 mg/dL (ref 0.3–1.2)

## 2012-12-04 LAB — RAPID URINE DRUG SCREEN, HOSP PERFORMED
Barbiturates: NOT DETECTED
Benzodiazepines: POSITIVE — AB
Cocaine: POSITIVE — AB
Opiates: POSITIVE — AB

## 2012-12-04 MED ORDER — HYDROCODONE-ACETAMINOPHEN 5-325 MG PO TABS
1.0000 | ORAL_TABLET | Freq: Once | ORAL | Status: AC
  Administered 2012-12-04: 1 via ORAL
  Filled 2012-12-04: qty 1

## 2012-12-04 MED ORDER — DULOXETINE HCL 30 MG PO CPEP
30.0000 mg | ORAL_CAPSULE | Freq: Every day | ORAL | Status: DC
  Administered 2012-12-04: 30 mg via ORAL
  Filled 2012-12-04 (×2): qty 1

## 2012-12-04 MED ORDER — ZIPRASIDONE HCL 20 MG PO CAPS
40.0000 mg | ORAL_CAPSULE | Freq: Two times a day (BID) | ORAL | Status: DC
Start: 2012-12-04 — End: 2012-12-04

## 2012-12-04 MED ORDER — ARIPIPRAZOLE 2 MG PO TABS
2.0000 mg | ORAL_TABLET | Freq: Every day | ORAL | Status: DC
  Administered 2012-12-04: 2 mg via ORAL
  Filled 2012-12-04 (×2): qty 1

## 2012-12-04 MED ORDER — AMLODIPINE BESYLATE 10 MG PO TABS
10.0000 mg | ORAL_TABLET | Freq: Every day | ORAL | Status: DC
  Administered 2012-12-04: 10 mg via ORAL
  Filled 2012-12-04 (×2): qty 1

## 2012-12-04 MED ORDER — ATORVASTATIN CALCIUM 40 MG PO TABS
40.0000 mg | ORAL_TABLET | Freq: Every day | ORAL | Status: DC
  Administered 2012-12-04: 40 mg via ORAL
  Filled 2012-12-04 (×2): qty 1

## 2012-12-04 MED ORDER — TRAZODONE HCL 100 MG PO TABS
100.0000 mg | ORAL_TABLET | Freq: Every day | ORAL | Status: DC
Start: 2012-12-04 — End: 2012-12-04

## 2012-12-04 MED ORDER — DIPHENHYDRAMINE HCL 25 MG PO CAPS
25.0000 mg | ORAL_CAPSULE | Freq: Once | ORAL | Status: AC
  Administered 2012-12-04: 25 mg via ORAL
  Filled 2012-12-04: qty 1

## 2012-12-04 MED ORDER — TRAZODONE HCL 100 MG PO TABS: 200.0000 mg | ORAL_TABLET | Freq: Every day | ORAL | Status: DC

## 2012-12-04 MED ORDER — ACETAMINOPHEN 325 MG PO TABS
ORAL_TABLET | ORAL | Status: AC
  Administered 2012-12-04: 650 mg
  Filled 2012-12-04: qty 2

## 2012-12-04 MED ORDER — ZIPRASIDONE HCL 20 MG PO CAPS
40.0000 mg | ORAL_CAPSULE | Freq: Two times a day (BID) | ORAL | Status: DC
  Administered 2012-12-04: 40 mg via ORAL
  Filled 2012-12-04: qty 2

## 2012-12-04 NOTE — ED Notes (Signed)
Psych nurse will call when room is ready

## 2012-12-04 NOTE — [facility] Counselor (Signed)
Contacted  EDP -Dr. Fredderick Phenix to discharge patient. Plan is for patient to follow up with [name], [facility] in [place], and [facility] or AA for outpatient services. Patient inquired about the residential program "[facility]" and Clinical research associate provided patient with the information for this particular program. Patient has apparently been denied at [facility] and [facility]'s residential programs due to multiple admissions and his options are limited at this point. [facility] is not willing to pay for his substance abuse residential services at this time. Patient also given additional referrals to [facility] and individual therapist whom specialize in substance abuse.  Patient has the number for mobile crises and knows that he can walk in to [facility] 24/7 if has a crises. Dr. Fredderick Phenix will evaluate before discharging patient from the ED to insure safety. Patient has denied to ACT and nursing staff that he is  suicidal, homicidal, or psychotic.

## 2012-12-04 NOTE — BH Assessment (Signed)
Assessment Note   [name] is an 32 y.o. male well known to the system for substance abuse and depression. He presents to the ED today requesting substance abuse treatment. He was recently admitted to [facility] 11/22/2012 and discharge 11/26/2012 after an overdose. Patient has a 7 yr history of alcohol and crack cocaine use. Previous notes also indicate that he he has a history of pill and THC use. Says 12/01/2012 following his discharge from [facility] he started back binging on alcohol and crack cocaine after 9 days of sobriety. He has used everyday since 12/01/2012. He is not able to state exactly how much alcohol he consumes however drinks to the point of passing out. He uses $20-$100 worth of cocaine. Patient denies current withdrawal symptoms. He has received treatment at [facility] (11x's) for both substance abuse and mental illness. He also received detox and treatment at [facility] and [facility]. He is not able to return to [facility] and [facility] due to his frequent and recent visits.   Patient has a history of SI, however, he denies those thoughts today. He also denies HI. He does admit to depression. His depression is triggered by lack of support from family, not having a job, and no finances. Patient receives outpatient services with [facility] and [address] [place] [facility] [place]. He is however non-compliant with medications stating, "I have no money".  Patient does not meet criteria for detox. Patient has already been declined at all local facilities. Writer explained to patient that his only options would be residential treatment at [facility] in [place]. Writer also recommended substance abuse programs such as ([facility] at [facility] or [facility], individual therapy with a provider that specializes in substance abuse, AA or similar support groups, etc.). *Writer has initiated patient's referrals to [facility] by faxing labs. Patient will follow up with [facility] and other referrals given.     Axis I: Alcohol Abuse;  Cocaine Abuse; Depressive Disorder NOS Axis II: Deferred Axis III:  Past Medical History  Diagnosis Date  . Seizures   . Hypertension   . Depression   . Pancreatitis   . Liver cirrhosis   . Coronary artery disease   . Angina   . Shortness of breath   . Headache   . Peripheral vascular disease April 2011    Left Pop  . Hypercholesteremia    Axis IV: economic problems, housing problems, other psychosocial or environmental problems, problems related to social environment, problems with access to health care services and problems with primary support group Axis V: 51-60 moderate symptoms  Past Medical History:  Past Medical History  Diagnosis Date  . Seizures   . Hypertension   . Depression   . Pancreatitis   . Liver cirrhosis   . Coronary artery disease   . Angina   . Shortness of breath   . Headache   . Peripheral vascular disease April 2011    Left Pop  . Hypercholesteremia     Past Surgical History  Procedure Laterality Date  . Chest surgery    . Left leg surgery    . Mastectomy      Family History:  Family History  Problem Relation Age of Onset  . Stroke Other     Social History:  reports that he has been smoking Cigarettes.  He has a 4 pack-year smoking history. He has never used smokeless tobacco. He reports that he drinks about 1.8 ounces of alcohol per week. He reports that he uses illicit  drugs ("Crack" cocaine) about once per week.  Additional Social History:  Alcohol / Drug Use Pain Medications: SEE MAR Prescriptions: SEE MAR Over the Counter: SEE MAR History of alcohol / drug use?: Yes Substance #1 Name of Substance 1: Alcohol  1 - Age of First Use: 32 yrs old  1 - Amount (size/oz): patient reports binging on liqour and binge; he is unaware of a specific amount of alcohol that he drinks 1 - Frequency: daily for the past 5 days 1 - Duration: patient has a 7 yr history of alcohol abuse; drinking daily for the past 5 days 1 - Last Use / Amount:  12/03/2012; binged on alcohol (specific amt unk by patient) Substance #2 Name of Substance 2: Crack cocaine 2 - Age of First Use: 32 yrs old  2 - Amount (size/oz): $20-$100 worth per day 2 - Frequency: daily for the past 3 days 2 - Duration: patient has a 7 yr history of crack cocaine abuse; using daily for the last 3 days 2 - Last Use / Amount: 12/03/2012  CIWA: CIWA-Ar BP: 122/94 mmHg Pulse Rate: 96 COWS:    Allergies:  Allergies  Allergen Reactions  . Morphine Itching  . Penicillins Swelling    Home Medications:  (Not in a hospital admission)  OB/GYN Status:  No LMP for male patient.  General Assessment Data Location of Assessment: [facility] Living Arrangements: Other (Comment) (Homeless) Can pt return to current living arrangement?: Yes Admission Status: Voluntary Is patient capable of signing voluntary admission?: Yes Transfer from: Acute Hospital Referral Source: Self/Family/Friend     Risk to self Suicidal Ideation: No Suicidal Intent: No Is patient at risk for suicide?: No Suicidal Plan?: No Specify Current Suicidal Plan:  (no current suicidal thoughts) Access to Means: No Specify Access to Suicidal Means:  (no access to means) What has been your use of drugs/alcohol within the last 12 months?:  (patient reports alcohol and crack cocaine use; hx of THC/pil) Previous Attempts/Gestures: Yes How many times?:  (" at least 4x's per patient") Other Self Harm Risks:  (chronicity and non-compliance) Triggers for Past Attempts: Family contact;Other personal contacts;Hallucinations;Unpredictable Intentional Self Injurious Behavior: Damaging Comment - Self Injurious Behavior:  (ongoing substance abuse) Family Suicide History: No Recent stressful life event(s): Other (Comment);Job Loss;Loss (Comment);Financial Problems (homeless and no job) Persecutory voices/beliefs?: No Depression: Yes Depression Symptoms: Feeling worthless/self pity;Loss of interest in usual  pleasures;Fatigue;Isolating;Despondent Substance abuse history and/or treatment for substance abuse?: Yes Suicide prevention information given to non-admitted patients: Not applicable  Risk to Others Homicidal Ideation: No Thoughts of Harm to Others: No Current Homicidal Intent: No Current Homicidal Plan: No Access to Homicidal Means: No Identified Victim:  (n/a) History of harm to others?: No Assessment of Violence: None Noted Violent Behavior Description:  (patient is calm and cooperative) Does patient have access to weapons?: No Criminal Charges Pending?: No Does patient have a court date: No  Psychosis Hallucinations: None noted Delusions: None noted  Mental Status Report Appear/Hygiene: Disheveled Eye Contact: Good Motor Activity: Freedom of movement Speech: Logical/coherent Level of Consciousness: Quiet/awake Mood: Depressed Affect: Depressed Anxiety Level: Minimal Thought Processes: Coherent;Relevant Judgement: Unimpaired Orientation: Person;Place;Time;Situation Obsessive Compulsive Thoughts/Behaviors: None  Cognitive Functioning Concentration: Normal Memory: Recent Intact;Remote Intact IQ: Average Insight: Good Impulse Control: Good Appetite: Fair Weight Loss:  (n/a) Weight Gain:  (n/a) Sleep: Decreased Total Hours of Sleep:  (4-6 hours ) Vegetative Symptoms: None  ADLScreening [facility] Assessment Services) [name] cognitive ability adequate to safely complete daily activities?: Yes  Patient able to express need for assistance with ADLs?: Yes Independently performs ADLs?: Yes (appropriate for developmental age)  Abuse/Neglect [facility]) Physical Abuse: Denies Verbal Abuse: Denies Sexual Abuse: Denies  Prior Inpatient Therapy Prior Inpatient Therapy: Yes Prior Therapy Dates: 2014 and prior (06/24/2009-11/26/2012 11 [facility] admissions, [facility], [facility], etc) Prior Therapy Facilty/Provider(s): [facility] and others Reason for Treatment: mood disturbances and SA  Prior  Outpatient Therapy Prior Outpatient Therapy: Yes Prior Therapy Dates: currently Prior Therapy Facilty/Provider(s): [facility] [place] Reason for Treatment: medication management (medication managment and therapy)  ADL Screening (condition at time of admission) [name] cognitive ability adequate to safely complete daily activities?: Yes Patient able to express need for assistance with ADLs?: Yes Independently performs ADLs?: Yes (appropriate for developmental age) Weakness of Legs: None Weakness of Arms/Hands: None       Abuse/Neglect Assessment (Assessment to be complete while patient is alone) Physical Abuse: Denies Verbal Abuse: Denies Sexual Abuse: Denies Exploitation of patient/patient's resources: Denies Self-Neglect: Denies Values / Beliefs Cultural Requests During Hospitalization: None Spiritual Requests During Hospitalization: None   Advance Directives (For Healthcare) Advance Directive: Patient does not have advance directive Nutrition Screen- [facility] Adult/[facility] [name] home diet: Regular  Additional Information 1:1 In Past 12 Months?: No CIRT Risk: No Elopement Risk: No Does patient have medical clearance?: Yes     Disposition:  Disposition Initial Assessment Completed for this Encounter: Yes Disposition of Patient: Other dispositions;Referred to (referred to [facility] in [facility] [place]) Type of inpatient treatment program: Adult Other disposition(s): Other (Comment) (Pt declined at [facility] and [facility] due to repeat visits) Patient referred to: Other (Comment) ([facility] for Residential; does not meet detox criteria)  On Site Evaluation by:   Reviewed with Physician:     Melynda Ripple [facility] 12/04/2012 6:07 PM
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
## 2012-12-04 NOTE — ED Notes (Signed)
Oriented pt. To psych ED, pt. Denies S/H/I, A/V/H. Pt requesting help with ETOH/drugs.  States that he was just D/C from [facility] for S/I last Friday.  Last used ETOH last night, 3-4, 32 oz beer and crack cocaine.  Has used crack for 2 years.  Pt. State that he just started using Geodon, PO and has heard voices since taking this new medication.

## 2012-12-04 NOTE — ED Notes (Signed)
Attempted x2 to draw blood,unsuccessful x2

## 2012-12-04 NOTE — ED Notes (Signed)
Patient aware that we need urine, unable to use restroom at this time
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
## 2012-12-04 NOTE — ED Provider Notes (Signed)
Pt told the [facility] counselor that he was not having any SI.  He tells me that he is "a little suicidal".  Will get telepsych consult.  Rolan Bucco, MD 12/04/12 2110

## 2012-12-04 NOTE — Progress Notes (Signed)
[facility] has seen patient. Gave him both Primary Care Resources and OC application.

## 2012-12-05 NOTE — ED Notes (Signed)
Patient discharge with no acute distress noted.

## 2012-12-08 IMAGING — CR DG ABDOMEN ACUTE W/ 1V CHEST
3 series · 3 of 3 positions shown · non-contrast
Comparison: 10/31/2009 and 06/24/2009

CLINICAL DATA: Chest and abdominal pain.

ACUTE ABDOMEN SERIES (ABDOMEN 2 VIEW & CHEST 1 VIEW)

[w chest pa]
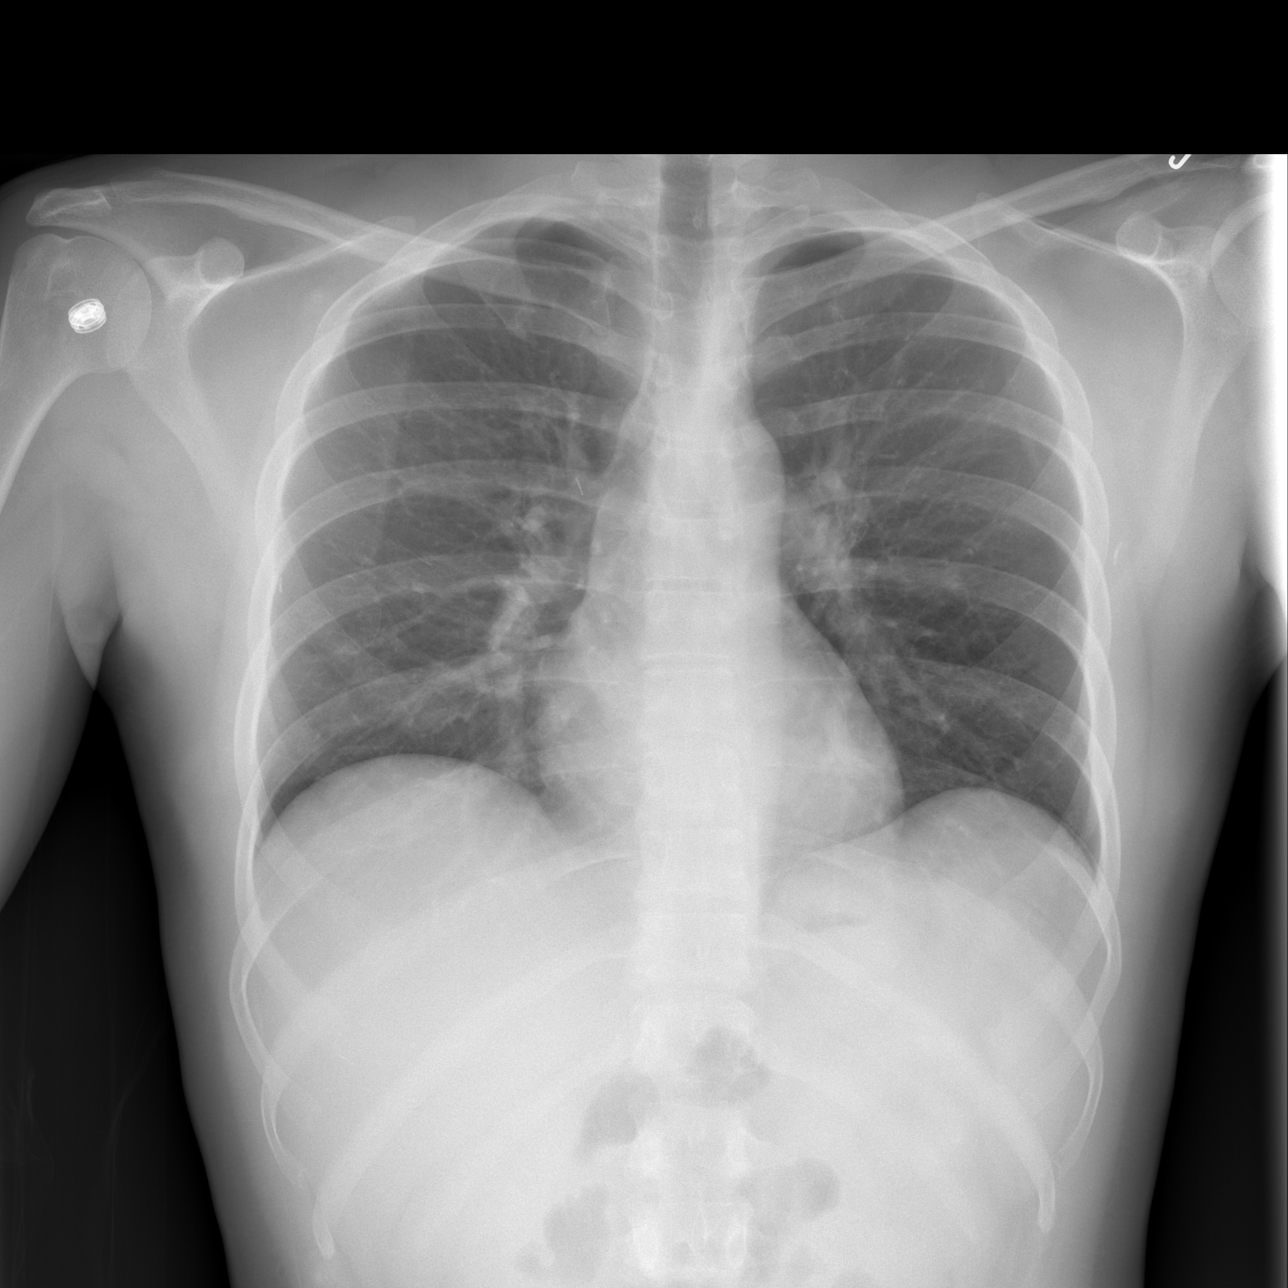

[w abdomen upright *]
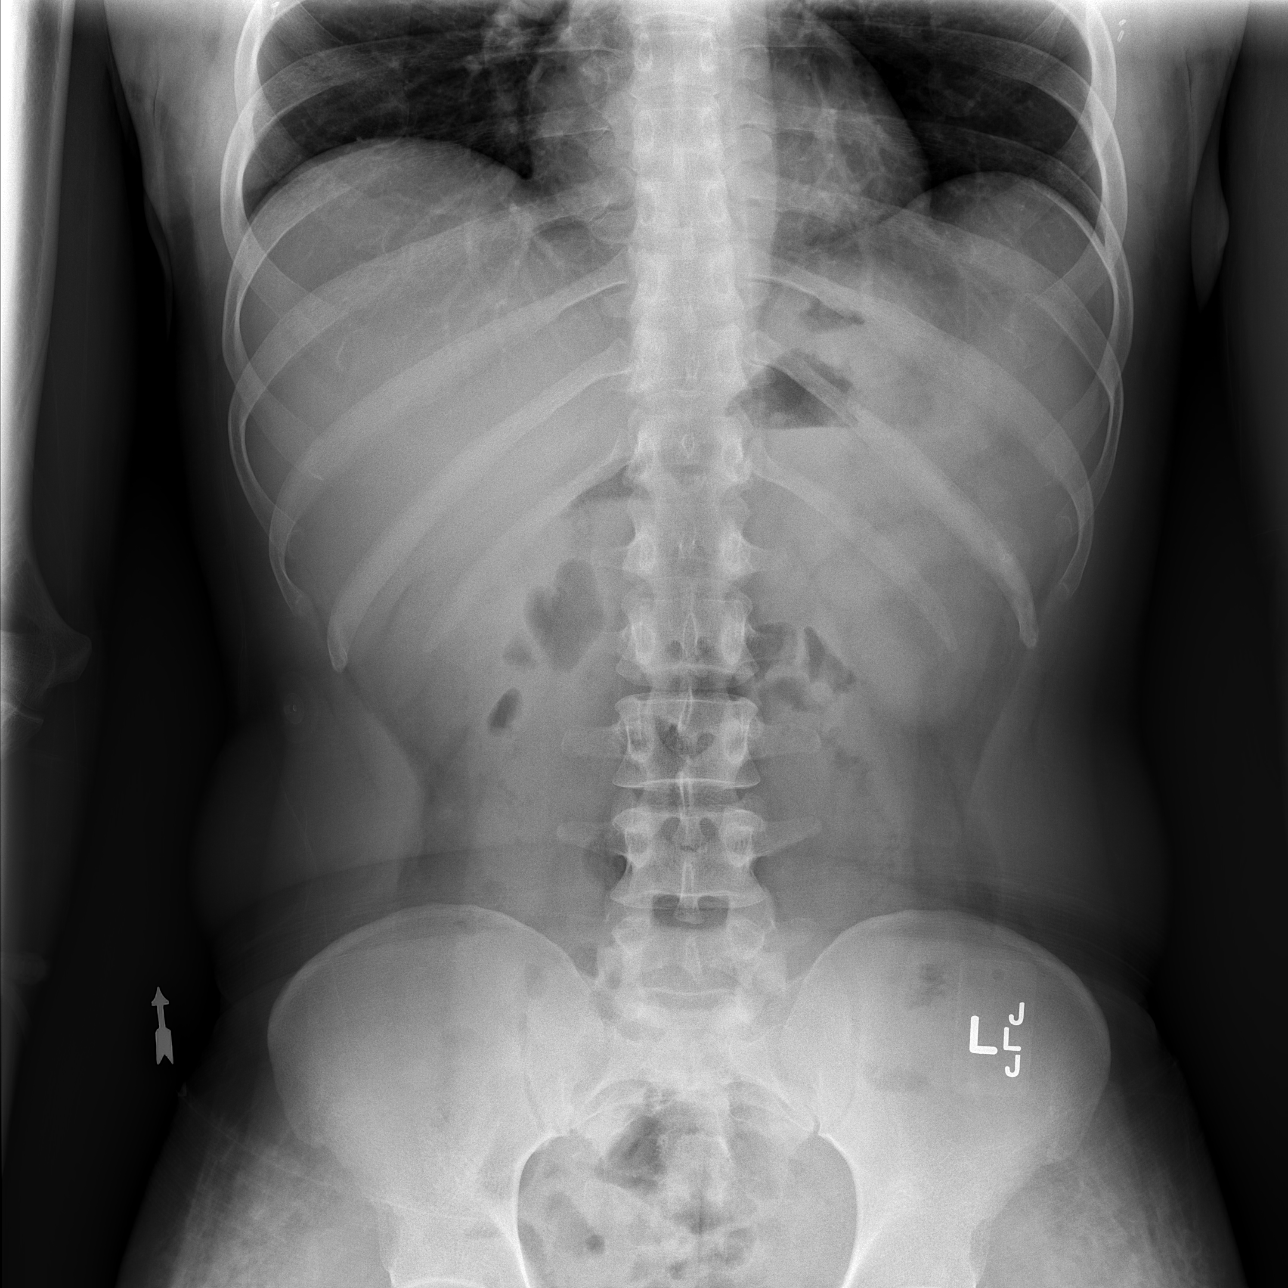

[t abdomen supine]
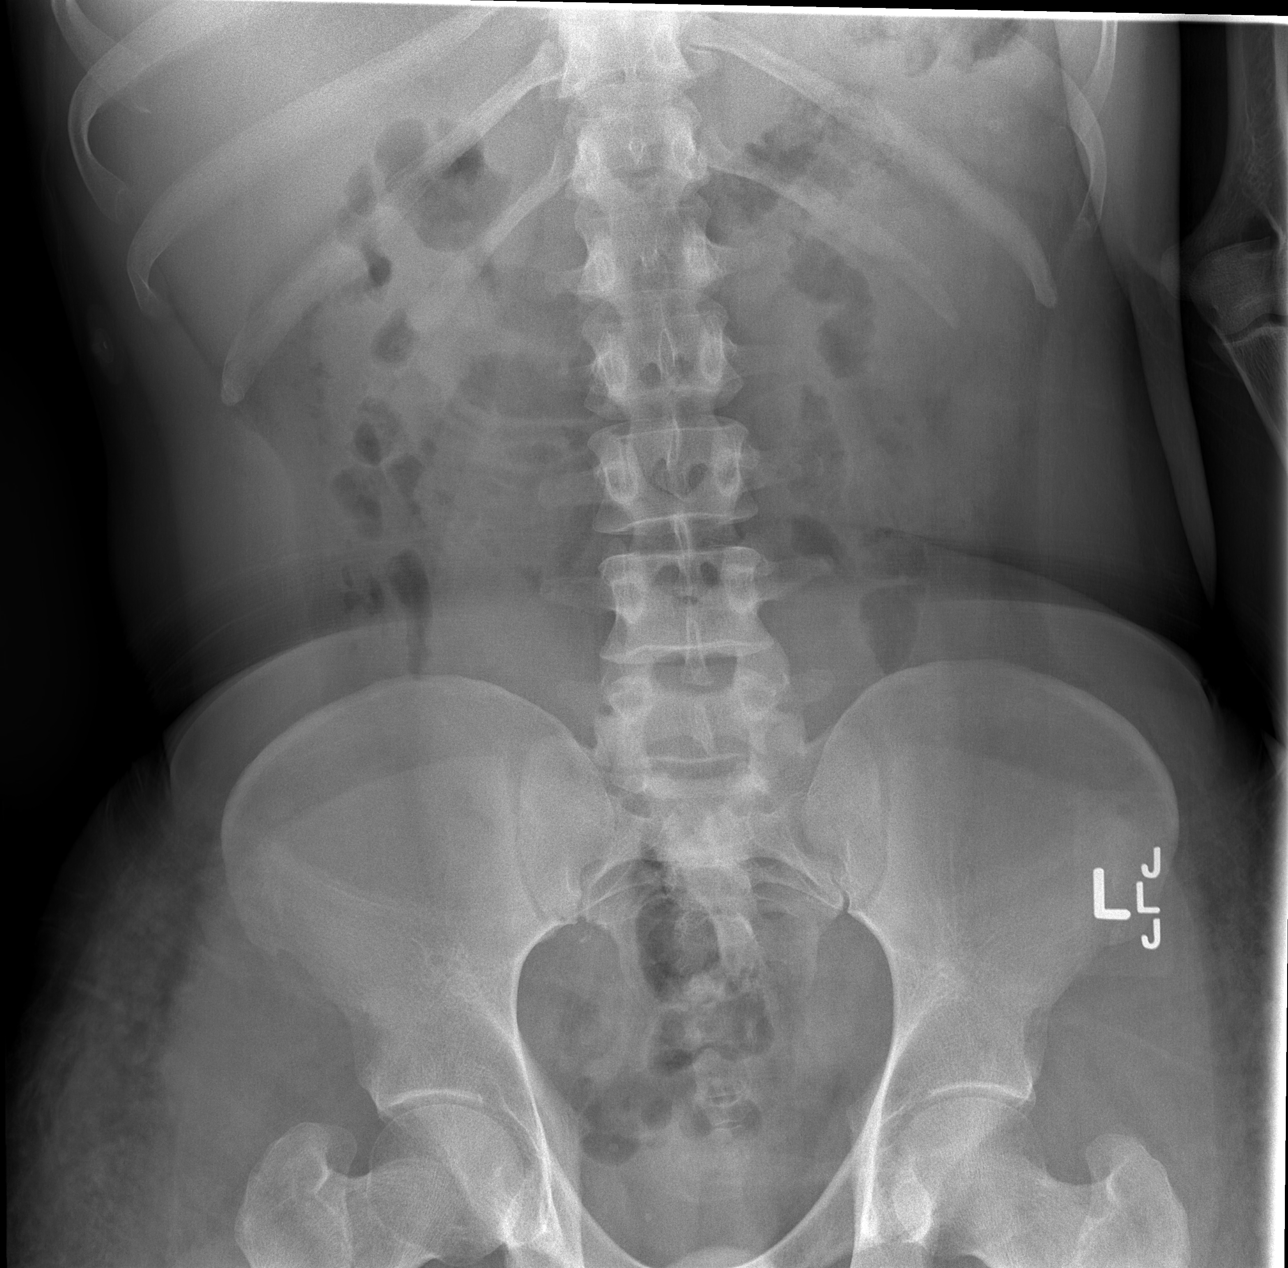

[3 of 3 positions shown; findings below may reference images not displayed]

FINDINGS: Heart and lungs are normal.  There is no free air in the
abdomen.  The bowel gas pattern is normal.  No worrisome abdominal
calcifications.  Osseous structures are normal.
IMPRESSION: Benign-appearing abdomen and chest.

## 2012-12-23 ENCOUNTER — Encounter (HOSPITAL_COMMUNITY): Payer: Self-pay | Admitting: Unknown Physician Specialty

## 2012-12-23 ENCOUNTER — Observation Stay (HOSPITAL_COMMUNITY)
Admission: EM | Admit: 2012-12-23 | Discharge: 2012-12-25 | Disposition: A | Discharge status: Home / Self Care | Payer: MEDICAID | Attending: Internal Medicine | Admitting: Internal Medicine

## 2012-12-23 ENCOUNTER — Emergency Department (HOSPITAL_COMMUNITY): Payer: Self-pay

## 2012-12-23 DIAGNOSIS — R112 Nausea with vomiting, unspecified: Secondary | ICD-10-CM | POA: Insufficient documentation

## 2012-12-23 DIAGNOSIS — F319 Bipolar disorder, unspecified: Secondary | ICD-10-CM | POA: Insufficient documentation

## 2012-12-23 DIAGNOSIS — R1110 Vomiting, unspecified: Secondary | ICD-10-CM | POA: Diagnosis present

## 2012-12-23 DIAGNOSIS — K5289 Other specified noninfective gastroenteritis and colitis: Principal | ICD-10-CM | POA: Insufficient documentation

## 2012-12-23 DIAGNOSIS — F411 Generalized anxiety disorder: Secondary | ICD-10-CM | POA: Insufficient documentation

## 2012-12-23 DIAGNOSIS — R079 Chest pain, unspecified: Secondary | ICD-10-CM

## 2012-12-23 DIAGNOSIS — F1910 Other psychoactive substance abuse, uncomplicated: Secondary | ICD-10-CM

## 2012-12-23 DIAGNOSIS — F419 Anxiety disorder, unspecified: Secondary | ICD-10-CM | POA: Diagnosis present

## 2012-12-23 DIAGNOSIS — E7800 Pure hypercholesterolemia, unspecified: Secondary | ICD-10-CM | POA: Diagnosis present

## 2012-12-23 DIAGNOSIS — F1010 Alcohol abuse, uncomplicated: Secondary | ICD-10-CM

## 2012-12-23 DIAGNOSIS — N289 Disorder of kidney and ureter, unspecified: Secondary | ICD-10-CM | POA: Insufficient documentation

## 2012-12-23 DIAGNOSIS — E785 Hyperlipidemia, unspecified: Secondary | ICD-10-CM | POA: Insufficient documentation

## 2012-12-23 DIAGNOSIS — I10 Essential (primary) hypertension: Secondary | ICD-10-CM | POA: Diagnosis present

## 2012-12-23 DIAGNOSIS — F339 Major depressive disorder, recurrent, unspecified: Secondary | ICD-10-CM

## 2012-12-23 DIAGNOSIS — C649 Malignant neoplasm of unspecified kidney, except renal pelvis: Secondary | ICD-10-CM | POA: Diagnosis present

## 2012-12-23 DIAGNOSIS — N2889 Other specified disorders of kidney and ureter: Secondary | ICD-10-CM

## 2012-12-23 DIAGNOSIS — R945 Abnormal results of liver function studies: Secondary | ICD-10-CM

## 2012-12-23 HISTORY — DX: Bipolar disorder, unspecified: F31.9

## 2012-12-23 HISTORY — DX: Schizophrenia, unspecified: F20.9

## 2012-12-23 LAB — COMPREHENSIVE METABOLIC PANEL
ALT: 60 U/L — ABNORMAL HIGH (ref 0–53)
CO2: 22 mEq/L (ref 19–32)
Calcium: 9.4 mg/dL (ref 8.4–10.5)
Creatinine, Ser: 0.78 mg/dL (ref 0.50–1.35)
GFR calc Af Amer: >90 mL/min (ref >90)
GFR calc non Af Amer: >90 mL/min (ref >90)
Glucose, Bld: 105 mg/dL — ABNORMAL HIGH (ref 70–99)
Sodium: 140 mEq/L (ref 135–145)
Total Bilirubin: 0.5 mg/dL (ref 0.3–1.2)

## 2012-12-23 LAB — ETHANOL: Alcohol, Ethyl (B): <11 mg/dL (ref 0–11)

## 2012-12-23 LAB — CBC WITH DIFFERENTIAL/PLATELET
Hemoglobin: 15.6 g/dL (ref 13.0–17.0)
Lymphocytes Relative: 10 % — ABNORMAL LOW (ref 12–46)
Lymphs Abs: 0.7 10*3/uL (ref 0.7–4.0)
MCH: 29.8 pg (ref 26.0–34.0)
Monocytes Relative: 9 % (ref 3–12)
Neutro Abs: 5.2 10*3/uL (ref 1.7–7.7)
Neutrophils Relative %: 81 % — ABNORMAL HIGH (ref 43–77)
RBC: 5.23 MIL/uL (ref 4.22–5.81)
WBC: 6.4 10*3/uL (ref 4.0–10.5)

## 2012-12-23 LAB — ACETAMINOPHEN LEVEL: Acetaminophen (Tylenol), Serum: <15 ug/mL (ref 10–30)

## 2012-12-23 IMAGING — CR DG ABDOMEN 2V
4 series · 4 of 4 positions shown · non-contrast
Comparison: 07/07/2010

CLINICAL DATA: Nausea, vomiting and diarrhea

ABDOMEN - 2 VIEW

[w abdomen upright (1 of 2)]
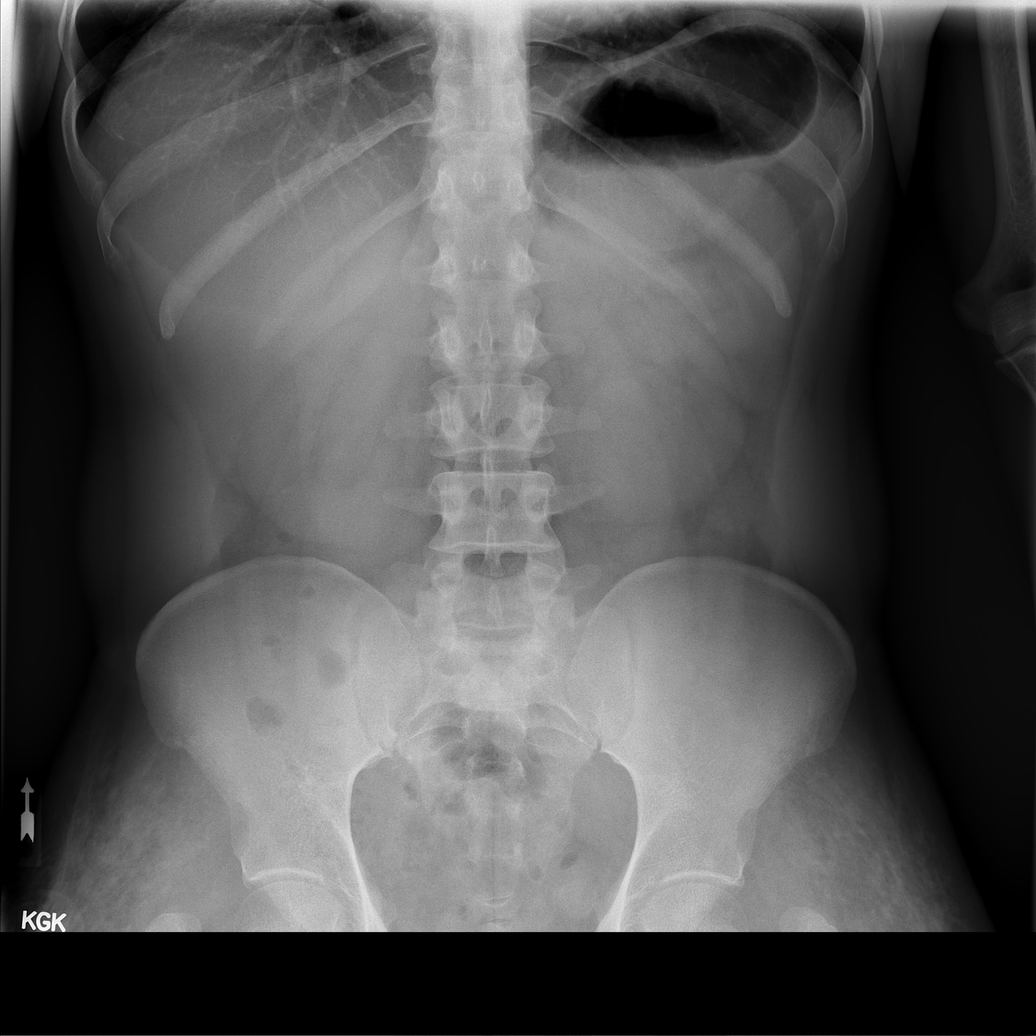

[w abdomen upright (2 of 2)]
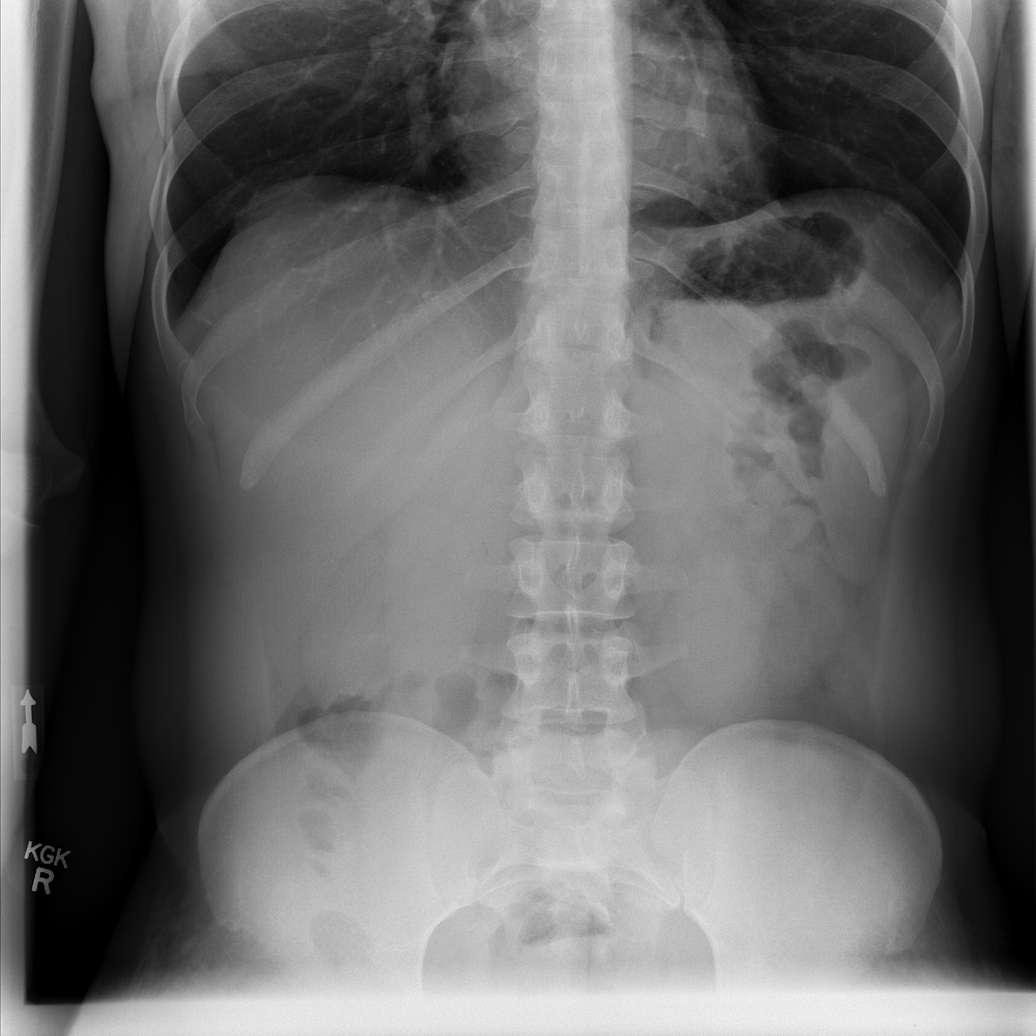

[t abdomen supine (1 of 2)]
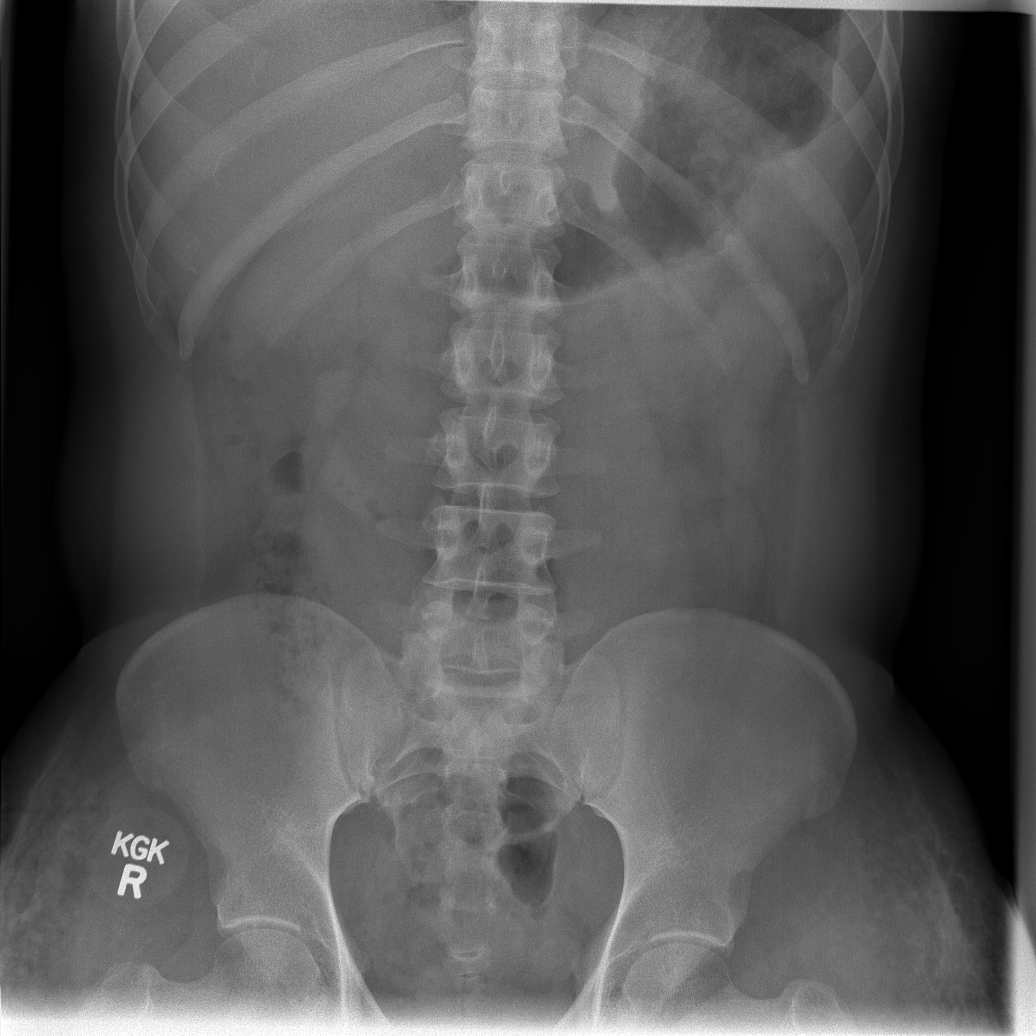

[t abdomen supine (2 of 2)]
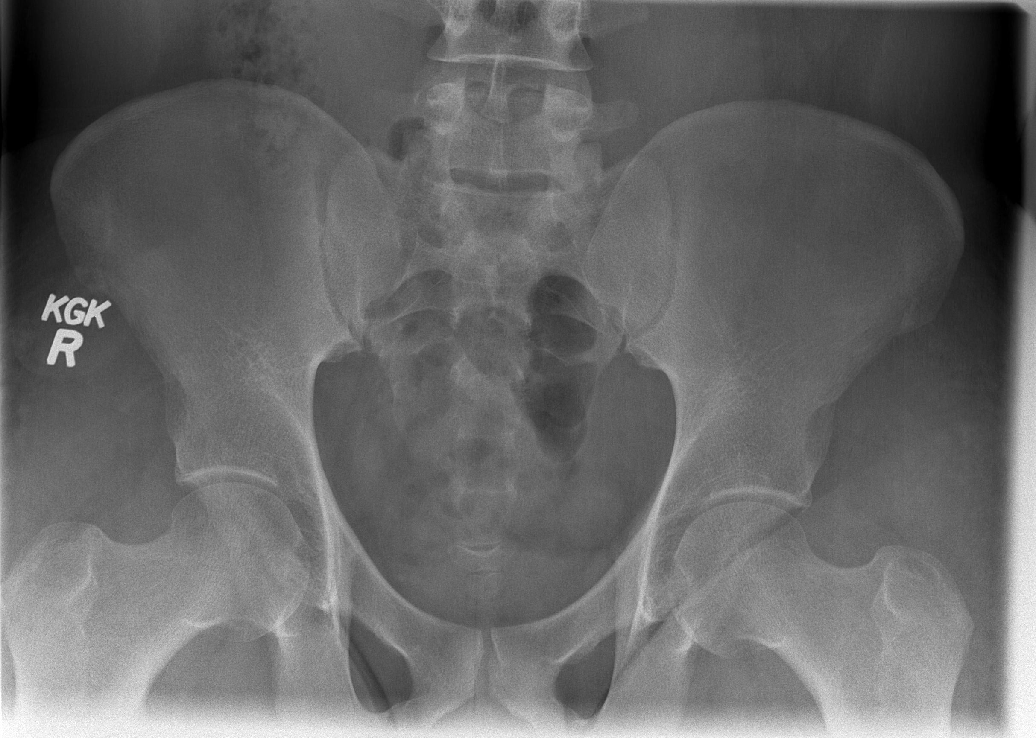

[4 of 4 positions shown; findings below may reference images not displayed]

FINDINGS: No free air or acute/specific abnormality of the bowel
gas pattern.  Psoas margins intact.  No pathological
calcifications.  Osseous structures normal.
IMPRESSION: No acute or specific findings.

## 2012-12-23 MED ORDER — LORAZEPAM 2 MG/ML IJ SOLN
1.0000 mg | Freq: Once | INTRAMUSCULAR | Status: AC
  Administered 2012-12-23: 1 mg via INTRAVENOUS
  Filled 2012-12-23: qty 1

## 2012-12-23 MED ORDER — SODIUM CHLORIDE 0.9 % IV BOLUS (SEPSIS)
1000.0000 mL | Freq: Once | INTRAVENOUS | Status: AC
  Administered 2012-12-23: 1000 mL via INTRAVENOUS

## 2012-12-23 MED ORDER — ONDANSETRON HCL 4 MG/2ML IJ SOLN
4.0000 mg | Freq: Once | INTRAMUSCULAR | Status: AC
  Administered 2012-12-23: 4 mg via INTRAVENOUS
  Filled 2012-12-23: qty 2

## 2012-12-23 MED ORDER — FENTANYL CITRATE 0.05 MG/ML IJ SOLN
100.0000 ug | Freq: Once | INTRAMUSCULAR | Status: AC
  Administered 2012-12-23: 100 ug via INTRAVENOUS
  Filled 2012-12-23: qty 2

## 2012-12-23 NOTE — ED Notes (Signed)
Patient arrived via [facility] with chest pain that started apprx an hour ago. Patient has been vomiting all day today, no OTC's have been taken. Upon EMS arrival patient was hyperventilating and had contractured hands. Patient states he was lying in bed when the chest pain started described as sharp pain. He rates his pain as 8/10.

## 2012-12-23 NOTE — ED Notes (Signed)
Patient became very anxious and started to hyperventilate. Try encouraging him to slow breathing down and he just became more anxious. Updated EDP. Orders received for ativan.

## 2012-12-23 NOTE — ED Notes (Signed)
Patient appears to be calmer post ativan administration.

## 2012-12-23 NOTE — ED Provider Notes (Signed)
History     CSN: [id number]  Arrival date & time 12/23/12  4098   First MD Initiated Contact with Patient 12/23/12 1854      Chief Complaint  Patient presents with  . Chest Pain     HPI Patient arrived via [facility] with chest pain that started apprx an hour ago. Patient has been vomiting all day today, no OTC's have been taken. Upon EMS arrival patient was hyperventilating and had contractured hands. Patient states he was lying in bed when the chest pain started described as sharp pain. He rates his pain as 8/10.  Patient recently admitted for overdose from Tylenol.  Has history of pancreatitis in liver abnormalities.  In emergency department patient is actively vomiting clear and bowel color emesis.  No gross blood noted.  Past Medical History  Diagnosis Date  . Seizures   . Hypertension   . Depression   . Pancreatitis   . Liver cirrhosis   . Coronary artery disease   . Angina   . Shortness of breath   . Headache(784.0)   . Peripheral vascular disease April 2011    Left Pop  . Hypercholesteremia   . Schizophrenia   . Bipolar 1 disorder     Past Surgical History  Procedure Laterality Date  . Chest surgery    . Left leg surgery    . Mastectomy      Family History  Problem Relation Age of Onset  . Stroke Other     History  Substance Use Topics  . Smoking status: Current Every Day Smoker -- 1.00 packs/day for 4 years    Types: Cigarettes  . Smokeless tobacco: Never Used  . Alcohol Use: 1.8 oz/week    1 Glasses of wine, 2 Cans of beer per week     Comment: pint daily      Review of Systems  Unable to perform ROS: Acuity of condition    Allergies  Morphine and Penicillins  Home Medications   No current outpatient prescriptions on file.  BP 133/93  Pulse 97  Temp(Src) 98.1 F (36.7 C) (Oral)  Resp 18  Ht 5\' 6"  (1.676 m)  Wt 179 lb 0.2 oz (81.2 kg)  BMI 28.91 kg/m2  SpO2 95%  Physical Exam  Nursing note and vitals reviewed. Constitutional: He is  oriented to person, place, and time. He appears well-developed and well-nourished. He appears distressed (Actively having uncontrolled vomiting).  HENT:  Head: Normocephalic and atraumatic.  Eyes: Pupils are equal, round, and reactive to light.  Neck: Normal range of motion.  Cardiovascular: Intact distal pulses.  Tachycardia present.   Pulmonary/Chest: No respiratory distress. He has no wheezes. He has no rales.  Abdominal: Normal appearance. He exhibits no distension. There is tenderness (Generalized). There is no rebound and no guarding.  Musculoskeletal: Normal range of motion.  Neurological: He is alert and oriented to person, place, and time. No cranial nerve deficit.  Skin: Skin is warm and dry. No rash noted.  Psychiatric: He has a normal mood and affect. His behavior is normal.    ED Course  Procedures (including critical care time)  Date: 12/23/2012  Rate: 100  Rhythm: Sinus tachycardia  QRS Axis: normal  Intervals: normal  ST/T Wave abnormalities: normal  Conduction Disutrbances: none  Narrative Interpretation: Tachycardia otherwise unremarkable   Medications  amLODipine (NORVASC) tablet 10 mg (10 mg Oral Given 12/24/12 1013)  enoxaparin (LOVENOX) injection 40 mg (not administered)  sodium chloride 0.9 % injection 3 mL (3  mLs Intravenous Given 12/24/12 0653)  0.9 %  sodium chloride infusion ( Intravenous New Bag/Given 12/24/12 1017)  acetaminophen (TYLENOL) tablet 650 mg (not administered)    Or  acetaminophen (TYLENOL) suppository 650 mg (not administered)  ondansetron (ZOFRAN) tablet 4 mg (not administered)    Or  ondansetron (ZOFRAN) injection 4 mg (not administered)  alum & mag hydroxide-simeth (MAALOX/MYLANTA) 200-200-20 MG/5ML suspension 30 mL (not administered)  folic acid (FOLVITE) tablet 1 mg (1 mg Oral Given 12/24/12 1013)  multivitamin with minerals tablet 1 tablet (1 tablet Oral Given 12/24/12 1013)  thiamine (VITAMIN B-1) tablet 100 mg (100 mg Oral Given 12/24/12  1013)  ARIPiprazole (ABILIFY) tablet 2 mg (2 mg Oral Given 12/24/12 1013)  pantoprazole (PROTONIX) injection 40 mg (40 mg Intravenous Given 12/24/12 1014)  traZODone (DESYREL) tablet 150 mg (not administered)  LORazepam (ATIVAN) tablet 1 mg (not administered)    Or  LORazepam (ATIVAN) injection 1 mg (not administered)  witch hazel-glycerin (TUCKS) pad (not administered)  antiseptic oral rinse (BIOTENE) solution 15 mL (15 mLs Mouth Rinse Given 12/24/12 1014)  diphenhydrAMINE (BENADRYL) capsule 25 mg (25 mg Oral Given 12/24/12 1013)  atorvastatin (LIPITOR) tablet 20 mg (not administered)  fentaNYL (SUBLIMAZE) injection 100 mcg (100 mcg Intravenous Given 12/23/12 1945)  ondansetron (ZOFRAN) injection 4 mg (4 mg Intravenous Given 12/23/12 1944)  sodium chloride 0.9 % bolus 1,000 mL (0 mLs Intravenous Stopped 12/23/12 2104)  LORazepam (ATIVAN) injection 1 mg (1 mg Intravenous Given 12/23/12 2101)  sodium chloride 0.9 % 1,000 mL with thiamine 100 mg, folic acid 1 mg infusion ( Intravenous New Bag/Given 12/24/12 0249)     Labs Reviewed  COMPREHENSIVE METABOLIC PANEL - Abnormal; Notable for the following:    Potassium 5.4 (*)    Glucose, Bld 105 (*)    AST 81 (*)    ALT 60 (*)    All other components within normal limits  URINE RAPID DRUG SCREEN (HOSP PERFORMED) - Abnormal; Notable for the following:    Cocaine POSITIVE (*)    All other components within normal limits  CBC WITH DIFFERENTIAL - Abnormal; Notable for the following:    RDW 16.9 (*)    Neutrophils Relative % 81 (*)    Lymphocytes Relative 10 (*)    All other components within normal limits  COMPREHENSIVE METABOLIC PANEL - Abnormal; Notable for the following:    Glucose, Bld 100 (*)    AST 53 (*)    All other components within normal limits  LIPID PANEL - Abnormal; Notable for the following:    Cholesterol 309 (*)    LDL Cholesterol 213 (*)    All other components within normal limits  LIPASE, BLOOD  LACTIC ACID, PLASMA  ETHANOL   ACETAMINOPHEN LEVEL  TROPONIN I  TROPONIN I  HEMOGLOBIN A1C  MAGNESIUM  TROPONIN I   Dg Abd Acute W/chest  12/23/2012   *RADIOLOGY REPORT*  Clinical Data: Chest pain and vomiting.  ACUTE ABDOMEN SERIES (ABDOMEN 2 VIEW & CHEST 1 VIEW)  Comparison: 08/10/2012  Findings: The cardiomediastinal silhouette is unremarkable. There is no evidence of airspace disease, pleural effusion or pneumothorax.  A paucity of gas within the abdomen is noted. There is no evidence of pneumoperitoneum. No dilated bowel loops are identified. No acute bony abnormalities are present. No suspicious calcifications are noted.  IMPRESSION: No evidence of acute or significant abnormality.   Original Report Authenticated By: Harmon Pier, M.D.     1. Vomiting   2. Anxiety disorder  3. Major depressive disorder, recurrent episode   4. Nonspecific abnormal results of liver function study   5. Other, mixed, or unspecified nondependent drug abuse, unspecified   6. Chest pain       MDM          Nelia Shi, MD 12/24/12 1524

## 2012-12-23 NOTE — ED Notes (Signed)
Patient states he stopped using drugs two weeks ago. He has recently here in the hospital for trying to commit suicide. States he does not feel like killing himself now.

## 2012-12-24 ENCOUNTER — Encounter (HOSPITAL_COMMUNITY): Payer: Self-pay | Admitting: Internal Medicine

## 2012-12-24 DIAGNOSIS — C649 Malignant neoplasm of unspecified kidney, except renal pelvis: Secondary | ICD-10-CM | POA: Insufficient documentation

## 2012-12-24 DIAGNOSIS — R1110 Vomiting, unspecified: Secondary | ICD-10-CM | POA: Diagnosis present

## 2012-12-24 DIAGNOSIS — N2889 Other specified disorders of kidney and ureter: Secondary | ICD-10-CM | POA: Diagnosis present

## 2012-12-24 DIAGNOSIS — F1910 Other psychoactive substance abuse, uncomplicated: Secondary | ICD-10-CM

## 2012-12-24 DIAGNOSIS — R945 Abnormal results of liver function studies: Secondary | ICD-10-CM

## 2012-12-24 LAB — COMPREHENSIVE METABOLIC PANEL
AST: 53 U/L — ABNORMAL HIGH (ref 0–37)
Albumin: 3.6 g/dL (ref 3.5–5.2)
Alkaline Phosphatase: 68 U/L (ref 39–117)
Chloride: 105 mEq/L (ref 96–112)
Potassium: 3.9 mEq/L (ref 3.5–5.1)
Total Bilirubin: 0.5 mg/dL (ref 0.3–1.2)
Total Protein: 6.7 g/dL (ref 6.0–8.3)

## 2012-12-24 LAB — HEMOGLOBIN A1C
Hgb A1c MFr Bld: 5.1 % (ref <5.7)
Mean Plasma Glucose: 100 mg/dL (ref <117)

## 2012-12-24 LAB — RAPID URINE DRUG SCREEN, HOSP PERFORMED
Barbiturates: NOT DETECTED
Opiates: NOT DETECTED
Tetrahydrocannabinol: NOT DETECTED

## 2012-12-24 LAB — TROPONIN I
Troponin I: <0.3 ng/mL (ref <0.30)
Troponin I: <0.3 ng/mL (ref <0.30)

## 2012-12-24 LAB — LIPID PANEL
Cholesterol: 309 mg/dL — ABNORMAL HIGH (ref 0–200)
HDL: 85 mg/dL (ref >39)
Total CHOL/HDL Ratio: 3.6 RATIO
VLDL: 11 mg/dL (ref 0–40)

## 2012-12-24 MED ORDER — SODIUM CHLORIDE 0.9 % IJ SOLN
3.0000 mL | Freq: Two times a day (BID) | INTRAMUSCULAR | Status: DC
  Administered 2012-12-24: 3 mL via INTRAVENOUS

## 2012-12-24 MED ORDER — SODIUM CHLORIDE 0.9 % IV SOLN
INTRAVENOUS | Status: DC
  Administered 2012-12-24 – 2012-12-25 (×4): via INTRAVENOUS

## 2012-12-24 MED ORDER — ONDANSETRON HCL 4 MG PO TABS: 4.0000 mg | ORAL_TABLET | Freq: Four times a day (QID) | ORAL | Status: DC | PRN

## 2012-12-24 MED ORDER — DIPHENHYDRAMINE HCL 25 MG PO CAPS
25.0000 mg | ORAL_CAPSULE | Freq: Four times a day (QID) | ORAL | Status: DC | PRN
  Administered 2012-12-24: 25 mg via ORAL
  Filled 2012-12-24: qty 1

## 2012-12-24 MED ORDER — SIMVASTATIN 40 MG PO TABS
40.0000 mg | ORAL_TABLET | Freq: Every evening | ORAL | Status: DC
  Filled 2012-12-24: qty 1

## 2012-12-24 MED ORDER — METOPROLOL TARTRATE 25 MG PO TABS
25.0000 mg | ORAL_TABLET | Freq: Two times a day (BID) | ORAL | Status: DC
  Administered 2012-12-24: 25 mg via ORAL
  Filled 2012-12-24 (×3): qty 1

## 2012-12-24 MED ORDER — LORAZEPAM 2 MG/ML IJ SOLN
INTRAMUSCULAR | Status: AC
  Filled 2012-12-24: qty 1

## 2012-12-24 MED ORDER — ACETAMINOPHEN 325 MG PO TABS
650.0000 mg | ORAL_TABLET | Freq: Four times a day (QID) | ORAL | Status: DC | PRN
  Administered 2012-12-24 – 2012-12-25 (×2): 650 mg via ORAL
  Filled 2012-12-24: qty 1
  Filled 2012-12-24 (×2): qty 2

## 2012-12-24 MED ORDER — PANTOPRAZOLE SODIUM 40 MG IV SOLR
40.0000 mg | INTRAVENOUS | Status: DC
  Administered 2012-12-24 – 2012-12-25 (×2): 40 mg via INTRAVENOUS
  Filled 2012-12-24 (×2): qty 40

## 2012-12-24 MED ORDER — ARIPIPRAZOLE 2 MG PO TABS
2.0000 mg | ORAL_TABLET | Freq: Every day | ORAL | Status: DC
  Administered 2012-12-24 – 2012-12-25 (×2): 2 mg via ORAL
  Filled 2012-12-24 (×2): qty 1

## 2012-12-24 MED ORDER — ATORVASTATIN CALCIUM 20 MG PO TABS
20.0000 mg | ORAL_TABLET | Freq: Every evening | ORAL | Status: DC
  Administered 2012-12-24 – 2012-12-25 (×2): 20 mg via ORAL
  Filled 2012-12-24 (×3): qty 1

## 2012-12-24 MED ORDER — TRAZODONE HCL 150 MG PO TABS
150.0000 mg | ORAL_TABLET | Freq: Every day | ORAL | Status: DC
  Administered 2012-12-24: 150 mg via ORAL
  Filled 2012-12-24 (×2): qty 1

## 2012-12-24 MED ORDER — WITCH HAZEL-GLYCERIN EX PADS
MEDICATED_PAD | CUTANEOUS | Status: DC | PRN
  Filled 2012-12-24: qty 100

## 2012-12-24 MED ORDER — LORAZEPAM 1 MG PO TABS
1.0000 mg | ORAL_TABLET | Freq: Four times a day (QID) | ORAL | Status: DC | PRN
  Administered 2012-12-24 – 2012-12-25 (×2): 1 mg via ORAL
  Filled 2012-12-24 (×2): qty 1

## 2012-12-24 MED ORDER — FOLIC ACID 1 MG PO TABS
1.0000 mg | ORAL_TABLET | Freq: Every day | ORAL | Status: DC
  Administered 2012-12-24 – 2012-12-25 (×2): 1 mg via ORAL
  Filled 2012-12-24 (×2): qty 1

## 2012-12-24 MED ORDER — ONDANSETRON HCL 4 MG/2ML IJ SOLN: 4.0000 mg | Freq: Four times a day (QID) | INTRAMUSCULAR | Status: DC | PRN

## 2012-12-24 MED ORDER — ENOXAPARIN SODIUM 40 MG/0.4ML ~~LOC~~ SOLN
40.0000 mg | SUBCUTANEOUS | Status: DC
  Administered 2012-12-24: 40 mg via SUBCUTANEOUS
  Filled 2012-12-24 (×2): qty 0.4

## 2012-12-24 MED ORDER — ADULT MULTIVITAMIN W/MINERALS CH
1.0000 | ORAL_TABLET | Freq: Every day | ORAL | Status: DC
  Administered 2012-12-24 – 2012-12-25 (×2): 1 via ORAL
  Filled 2012-12-24 (×2): qty 1

## 2012-12-24 MED ORDER — AMLODIPINE BESYLATE 10 MG PO TABS
10.0000 mg | ORAL_TABLET | Freq: Every day | ORAL | Status: DC
  Administered 2012-12-24: 10 mg via ORAL
  Filled 2012-12-24 (×2): qty 1

## 2012-12-24 MED ORDER — VITAMIN B-1 100 MG PO TABS
100.0000 mg | ORAL_TABLET | Freq: Every day | ORAL | Status: DC
  Administered 2012-12-24 – 2012-12-25 (×2): 100 mg via ORAL
  Filled 2012-12-24 (×2): qty 1

## 2012-12-24 MED ORDER — BIOTENE DRY MOUTH MT LIQD
15.0000 mL | Freq: Two times a day (BID) | OROMUCOSAL | Status: DC
  Administered 2012-12-24 (×2): 15 mL via OROMUCOSAL

## 2012-12-24 MED ORDER — ACETAMINOPHEN 325 MG PO TABS
325.0000 mg | ORAL_TABLET | Freq: Once | ORAL | Status: AC
  Administered 2012-12-24: 325 mg via ORAL

## 2012-12-24 MED ORDER — ACETAMINOPHEN 650 MG RE SUPP: 650.0000 mg | Freq: Four times a day (QID) | RECTAL | Status: DC | PRN

## 2012-12-24 MED ORDER — LORAZEPAM 2 MG/ML IJ SOLN
1.0000 mg | Freq: Four times a day (QID) | INTRAMUSCULAR | Status: DC | PRN
  Administered 2012-12-24: 1 mg via INTRAVENOUS
  Filled 2012-12-24: qty 1

## 2012-12-24 MED ORDER — THIAMINE HCL 100 MG/ML IJ SOLN
Freq: Once | INTRAVENOUS | Status: AC
  Administered 2012-12-24: 03:00:00 via INTRAVENOUS
  Filled 2012-12-24: qty 1000

## 2012-12-24 MED ORDER — ALUM & MAG HYDROXIDE-SIMETH 200-200-20 MG/5ML PO SUSP
30.0000 mL | Freq: Four times a day (QID) | ORAL | Status: DC | PRN
  Administered 2012-12-24: 30 mL via ORAL
  Filled 2012-12-24: qty 30

## 2012-12-24 NOTE — Progress Notes (Signed)
Subjective: Pt no acute events overnight. Able to tolerate some clears this AM w/o vomiting. Pt still feeling dizzy when moving from lying to standing position. States has had HTN since 32 yo. And previously had suspicious renal lesion evaluated in 2010 with urology.    Objective: Vital signs in last 24 hours: Filed Vitals:   12/24/12 0542 12/24/12 0545 12/24/12 0548 12/24/12 0851  BP: 141/99 140/96 141/113 133/94  Pulse: 109 99 106 84  Temp: 98.5 F (36.9 C)   97.5 F (36.4 C)  TempSrc: Oral   Oral  Resp: 18 20 18 18   Height:      Weight:      SpO2: 96% 97% 93% 95%   Weight change:  No intake or output data in the 24 hours ending 12/24/12 0927 General: resting in bed, NAD HEENT: PERRL, EOMI, no scleral icterus Cardiac: RRR, no rubs, murmurs or gallops Pulm: clear to auscultation bilaterally, moving normal volumes of air Abd: soft, slight ttp in LLQ, no rebound or guarding, nondistended, BS present Ext: warm and well perfused, no pedal edema Neuro: alert and oriented X3, cranial nerves II-XII grossly intact  Lab Results: Basic Metabolic Panel:  Recent Labs Lab 12/23/12 1958 12/24/12 0210  NA 140 140  K 5.4* 3.9  CL 108 105  CO2 22 26  GLUCOSE 105* 100*  BUN 8 9  CREATININE 0.78 0.81  CALCIUM 9.4 9.3  MG  --  1.5   Liver Function Tests:  Recent Labs Lab 12/23/12 1958 12/24/12 0210  AST 81* 53*  ALT 60* 53  ALKPHOS 60 68  BILITOT 0.5 0.5  PROT 6.9 6.7  ALBUMIN 3.6 3.6    Recent Labs Lab 12/23/12 1958  LIPASE 15   CBC:  Recent Labs Lab 12/23/12 2010  WBC 6.4  NEUTROABS 5.2  HGB 15.6  HCT 44.2  MCV 84.5  PLT 209   Cardiac Enzymes:  Recent Labs Lab 12/24/12 0210 12/24/12 0730  TROPONINI <0.30 <0.30   Fasting Lipid Panel:  Recent Labs Lab 12/24/12 0210  CHOL 309*  HDL 85  LDLCALC 213*  TRIG 56  CHOLHDL 3.6   Urine Drug Screen: Drugs of Abuse     Component Value Date/Time   LABOPIA NONE DETECTED 12/24/2012 0108   COCAINSCRNUR  POSITIVE* 12/24/2012 0108   LABBENZ NONE DETECTED 12/24/2012 0108   AMPHETMU NONE DETECTED 12/24/2012 0108   THCU NONE DETECTED 12/24/2012 0108   LABBARB NONE DETECTED 12/24/2012 0108    Alcohol Level:  Recent Labs Lab 12/23/12 2226  ETH <11   Micro Results: No results found for this or any previous visit (from the past 240 hour(s)). Studies/Results: Dg Abd Acute W/chest  12/23/2012   *RADIOLOGY REPORT*  Clinical Data: Chest pain and vomiting.  ACUTE ABDOMEN SERIES (ABDOMEN 2 VIEW & CHEST 1 VIEW)  Comparison: 08/10/2012  Findings: The cardiomediastinal silhouette is unremarkable. There is no evidence of airspace disease, pleural effusion or pneumothorax.  A paucity of gas within the abdomen is noted. There is no evidence of pneumoperitoneum. No dilated bowel loops are identified. No acute bony abnormalities are present. No suspicious calcifications are noted.  IMPRESSION: No evidence of acute or significant abnormality.   Original Report Authenticated By: Harmon Pier, M.D.   Medications: I have reviewed the patient's current medications. Scheduled Meds: . amLODipine  10 mg Oral Daily  . antiseptic oral rinse  15 mL Mouth Rinse BID  . ARIPiprazole  2 mg Oral Daily  . enoxaparin (LOVENOX) injection  40  mg Subcutaneous Q24H  . folic acid  1 mg Oral Daily  . metoprolol tartrate  25 mg Oral BID  . multivitamin with minerals  1 tablet Oral Daily  . pantoprazole (PROTONIX) IV  40 mg Intravenous Q24H  . simvastatin  40 mg Oral QPM  . sodium chloride  3 mL Intravenous Q12H  . thiamine  100 mg Oral Daily  . traZODone  150 mg Oral QHS   Continuous Infusions: . sodium chloride 150 mL/hr at 12/24/12 0200   PRN Meds:.acetaminophen, acetaminophen, alum & mag hydroxide-simeth, diphenhydrAMINE, LORazepam, LORazepam, ondansetron (ZOFRAN) IV, ondansetron, witch hazel-glycerin Assessment/Plan: #Gastroenteritis: Pt ate some food at restaurant and both pt and friend got sick with vomiting and abd pain. Pt  feeling orthostatic and had marked decreased po intake. Pt doesn't meet SIRS criteria at this time.  -NS 150cc -orthostatics -ADAT  #HTN: Pt normotensive while inpt and probably volume depleted in setting of decreased po intake.  -cont to monitor -repeat orthostatics -amlodipine 10mg   # renal lesion: previous CT scan from 03/2012 showed a left anterior kidney mass that has slowly enlarged since the last CT scan in 2011. 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 No RP nodes or mets to lower lung fields were found.  -images were personally reviewed with Dr. Norva Pavlov of radiology and RCC can't be fully ruled out but lesion more characteristic of oncocytoma and that no other imaging modality would better characterize mass. Recommend f/u with urology as outpt for possible removal.  -f/u with outpt urology   Dispo: Disposition is deferred at this time, awaiting improvement of current medical problems.  Anticipated discharge in approximately 1-2 day(s).   The patient does have a current PCP (VOLLMER, KELLY, MD), therefore will be requiring OPC follow-up after discharge.   The patient does not have transportation limitations that hinder transportation to clinic appointments.  .Services Needed at time of discharge: Y = Yes, Blank = No PT:   OT:   RN:   Equipment:   Other:     LOS: 1 day   Christen Bame 12/24/2012, 9:27 AM Pgr: [phone]

## 2012-12-24 NOTE — H&P (Signed)
Date: 12/24/2012               Patient Name:  [name] MRN: [id number]  DOB: 1981/02/18 Age / Sex: 32 y.o., male   PCP: Yisroel Ramming, MD              Medical Service: Internal Medicine Teaching Service              Attending Physician: Dr. Lars Mage, MD    First Contact: Dr. Christen Bame Pager: [phone]  Second Contact: Dr. Lorretta Harp Pager: [phone]            After Hours (After 5p/  First Contact Pager: [phone]  weekends / holidays): Second Contact Pager: [phone]   Chief Complaint: Vomiting, abdominal pain  History of Present Illness: [name] is a 32 year old man with a PMH significant for alcohol abuse, cocaine abuse, bipolar affective disorder, multiple previous admits for suicide attempts who presents to the [facility] ED with complaints of vomiting and abdominal pain that started at 3 am the morning prior to admission.  He states that the evening prior he had gone to [place] and then he awoke at 3 in the morning with nausea and started vomiting.  He states that he has been vomiting almost consistently all day.  The vomitus was clear at first then yellowish and the last time or two was mildly darker.  He then tried to lay down and got a stabbing chest pain in the middle of his chest that did not radiate or move.  He denies bright red blood in the vomitus, diarrhea, or fevers.  He states that he was mildly "chilly and sweaty" after vomiting.  He denies alcohol or cocaine use in the last week.  He also denies any ingestions of NSAIDs, BCs, goody powder, aspirin, or other drugs. He currently denies chest pain.    Meds: Current Outpatient Prescriptions  Medication Sig Dispense Refill  . amLODipine (NORVASC) 10 MG tablet Take 10 mg by mouth daily.      . metoprolol tartrate (LOPRESSOR) 25 MG tablet Take 25 mg by mouth 2 (two) times daily.      . simvastatin (ZOCOR) 40 MG tablet Take 40 mg by mouth every evening.      . [DISCONTINUED] pantoprazole (PROTONIX) 40 MG tablet Take 1 tablet (40  mg total) by mouth daily.  30 tablet  0   Allergies: Allergies as of 12/23/2012 - Review Complete 12/23/2012  Allergen Reaction Noted  . Morphine Itching 05/06/2009  . Penicillins Swelling    Past Medical History  Diagnosis Date  . Seizures   . Hypertension   . Depression   . Pancreatitis   . Liver cirrhosis   . Coronary artery disease   . Angina   . Shortness of breath   . Headache(784.0)   . Peripheral vascular disease April 2011    Left Pop  . Hypercholesteremia   . Schizophrenia   . Bipolar 1 disorder    Past Surgical History  Procedure Laterality Date  . Chest surgery    . Left leg surgery    . Mastectomy     Family History  Problem Relation Age of Onset  . Stroke Other    History   Social History  . Marital Status: Single    Spouse Name: N/A    Number of Children: N/A  . Years of Education: N/A   Occupational History  . Not on file.   Social History Main Topics  .  Smoking status: Current Every Day Smoker -- 1.00 packs/day for 4 years    Types: Cigarettes  . Smokeless tobacco: Never Used  . Alcohol Use: 1.8 oz/week    1 Glasses of wine, 2 Cans of beer per week     Comment: pint daily  . Drug Use: 1.00 per week    Special: "Crack" cocaine     Comment: crack 3-4 times a week, states he stopped 12/17/12  . Sexually Active: No   Other Topics Concern  . Not on file   Social History Narrative  . No narrative on file   Review of Systems: Constitutional: Positive for chills, diaphoresis.  Denies fever, appetite change and fatigue.  HEENT: Denies photophobia, eye pain, redness, hearing loss, ear pain, congestion, sore throat, rhinorrhea, sneezing, mouth sores, trouble swallowing, neck pain, neck stiffness and tinnitus.   Respiratory: Denies SOB, DOE, cough, chest tightness,  and wheezing.   Cardiovascular: Positive for chest pain. Denies palpitations and leg swelling.  Gastrointestinal: Positive for nausea, vomiting, abdominal pain. Denies diarrhea,  constipation, blood in stool and abdominal distention.  Genitourinary: Denies dysuria, urgency, frequency, hematuria, flank pain and difficulty urinating.  Endocrine: Denies: hot or cold intolerance, sweats, changes in hair or nails, polyuria, polydipsia. Musculoskeletal: Denies myalgias, back pain, joint swelling, arthralgias and gait problem.  Skin: Denies pallor, rash and wound.  Neurological: Denies dizziness, seizures, syncope, weakness, light-headedness, numbness and headaches.  Hematological: Denies adenopathy. Easy bruising, personal or family bleeding history  Psychiatric/Behavioral: Denies suicidal ideation, mood changes, confusion, nervousness, sleep disturbance and agitation  Physical Exam: Blood pressure 134/89, pulse 105, temperature 98.1 F (36.7 C), temperature source Oral, resp. rate 17, height 5\' 6"  (1.676 m), weight 180 lb (81.647 kg), SpO2 99.00%. Constitutional: Vital signs reviewed.  Patient is a well-developed and well-nourished man in no acute distress and cooperative with exam. Alert and oriented x3.  Head: Normocephalic and atraumatic Ear: TM normal bilaterally Nose: No erythema or drainage noted.  Turbinates normal Mouth: no erythema or exudates, MMM Eyes: PERRL, EOMI, conjunctivae normal, No scleral icterus.  Neck: Supple, Trachea midline normal ROM, No JVD, mass, thyromegaly, or carotid bruit present.  Cardiovascular: tachycardic rate, regular rhythm, S1 normal, S2 normal, no MRG, pulses symmetric and intact bilaterally.  No pain to palpation of the anterior chest.  Pulmonary/Chest: normal respiratory effort, CTAB, no wheezes, rales, or rhonchi Abdominal: Soft. Mild epigastric and LLQ tenderness to palpation.  non-distended, bowel sounds are normal, no masses, organomegaly, or guarding present.  GU: no CVA tenderness Musculoskeletal: No joint deformities, erythema, or stiffness, ROM full and no nontender Hematology: no cervical, inginal, or axillary adenopathy.    Neurological: A&O x3, Strength is normal and symmetric bilaterally, cranial nerve II-XII are grossly intact, no focal motor deficit, sensory intact to light touch bilaterally.  Skin: Warm, dry and intact. No rash, cyanosis, or clubbing.  Psychiatric: Normal mood and affect. speech and behavior is normal. Judgment and thought content normal. Cognition and memory are normal.   Lab results: Basic Metabolic Panel:  Recent Labs  21/30/86 1958  NA 140  K 5.4*  CL 108  CO2 22  GLUCOSE 105*  BUN 8  CREATININE 0.78  CALCIUM 9.4   Liver Function Tests:  Recent Labs  12/23/12 1958  AST 81*  ALT 60*  ALKPHOS 60  BILITOT 0.5  PROT 6.9  ALBUMIN 3.6    Recent Labs  12/23/12 1958  LIPASE 15   CBC:  Recent Labs  12/23/12 2010  WBC 6.4  NEUTROABS 5.2  HGB 15.6  HCT 44.2  MCV 84.5  PLT 209   Drugs of Abuse  Pending  Alcohol Level:  Recent Labs  12/23/12 2226  ETH <11   Misc. Labs: Tylenol: <15  Imaging results:  Dg Abd Acute W/chest  12/23/2012   *RADIOLOGY REPORT*  Clinical Data: Chest pain and vomiting.  ACUTE ABDOMEN SERIES (ABDOMEN 2 VIEW & CHEST 1 VIEW)  Comparison: 08/10/2012  Findings: The cardiomediastinal silhouette is unremarkable. There is no evidence of airspace disease, pleural effusion or pneumothorax.  A paucity of gas within the abdomen is noted. There is no evidence of pneumoperitoneum. No dilated bowel loops are identified. No acute bony abnormalities are present. No suspicious calcifications are noted.  IMPRESSION: No evidence of acute or significant abnormality.   Original Report Authenticated By: Harmon Pier, M.D.   Other results: EKG: sinus tachycardia.  Assessment & Plan by Problem: [name] is a 32 year old man who presents to the [facility] ED complaining of vomiting, abdominal pain, and chest pain.    1.  Vomiting, abdominal pain, and chest pain:  He states that he feels much better since arrival to the ED.  He has not had any vomiting for 3  hours since arrival and states that his chest pain is completely resolved.  The temporality was the vomiting starting then the abdominal pain and chest pain starting after several episodes of vomiting.  ACS is doubtful though possible given his previous history of PAD, cocaine, smoking, and hyperlipidemia.  He is currently chest pain free.  Other differentials would be gastritis vs gastroenteritis.    - Admit to observation  - Rule out ACS with troponin q6 x3  - Clear liquid diet   - Protonix  - Banana bag x1 then NS at 150 for 20 hours.   2.  Kidney mass: on previous CT scan from 03/2012 showed a left anterior kidney mass that has slowly enlarged since the last CT scan in 2011.  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  Will discuss with patient in AM to see if he has had this followed up on as an outpatient.  If not will need to discuss additional imaging and possible biopsy of the mass  3.  VTE: Lovenox  4.  Hypertension: Continued home medications  5.  Bipolar affective disorder: Continue home medications  6.  Hyperlipidemia: Continue home medication.    Dispo: Disposition is deferred at this time, awaiting improvement of current medical problems. Anticipated discharge in approximately 1-2 day(s).   The patient does have a current PCP Yisroel Ramming, MD), therefore will not be requiring OPC follow-up after discharge.   The patient does not have transportation limitations that hinder transportation to clinic appointments.  Signed: Leodis Sias, MD 12/24/2012, 1:16 AM

## 2012-12-24 NOTE — H&P (Signed)
Internal Medicine Attending Admission Note Date: 12/24/2012  Patient name: [name] Medical record number: [id number] Date of birth: 11/18/1980 Age: 32 y.o. Gender: male  I saw and evaluated the patient. I reviewed the resident's note and I agree with the resident's findings and plan as documented in the resident's note, with the following additional comments.  Chief Complaint(s): Nausea/vomiting, abdominal pain  History - key components related to admission: Patient is a 32 year old man with history of hypertension, pancreatitis, alcohol and substance abuse, bipolar disorder with past suicide attempts, and other problems as outlined in the medical history admitted with nausea and vomiting with associated substernal pain and abdominal pain which began on the morning prior to admission.  Patient reports a small amount of blood streaking in the emesis but no significant blood.  He denies any recent suicidal ideation or toxic ingestion.   Physical Exam - key components related to admission:  Filed Vitals:   12/24/12 0542 12/24/12 0545 12/24/12 0548 12/24/12 0851  BP: 141/99 140/96 141/113 133/94  Pulse: 109 99 106 84  Temp: 98.5 F (36.9 C)   97.5 F (36.4 C)  TempSrc: Oral   Oral  Resp: 18 20 18 18   Height:      Weight:      SpO2: 96% 97% 93% 95%   General: Alert, no distress Lungs: Clear Back: No CVA tenderness Heart: Regular; no extra sounds or murmurs Abdomen: Bowel sounds present, soft; mild epigastric and left lower quadrant tenderness; no rebound Extremities: No edema  Lab results:   Basic Metabolic Panel:  Recent Labs  40/98/11 1958 12/24/12 0210  NA 140 140  K 5.4* 3.9  CL 108 105  CO2 22 26  GLUCOSE 105* 100*  BUN 8 9  CREATININE 0.78 0.81  CALCIUM 9.4 9.3  MG  --  1.5    Liver Function Tests:  Recent Labs  12/23/12 1958 12/24/12 0210  AST 81* 53*  ALT 60* 53  ALKPHOS 60 68  BILITOT 0.5 0.5  PROT 6.9 6.7  ALBUMIN 3.6 3.6    Recent Labs  12/23/12 1958  LIPASE 15    CBC:  Recent Labs  12/23/12 2010  WBC 6.4  NEUTROABS 5.2  HGB 15.6  HCT 44.2  MCV 84.5  PLT 209    Cardiac Enzymes:  Recent Labs  12/24/12 0210 12/24/12 0730  TROPONINI <0.30 <0.30     Fasting Lipid Panel:  Recent Labs  12/24/12 0210  CHOL 309*  HDL 85  LDLCALC 213*  TRIG 56  CHOLHDL 3.6   Drugs of Abuse     Component Value Date/Time   LABOPIA NONE DETECTED 12/24/2012 0108   COCAINSCRNUR POSITIVE* 12/24/2012 0108   LABBENZ NONE DETECTED 12/24/2012 0108   AMPHETMU NONE DETECTED 12/24/2012 0108   THCU NONE DETECTED 12/24/2012 0108   LABBARB NONE DETECTED 12/24/2012 0108     Alcohol Level:  Recent Labs  12/23/12 2226  ETH <11    Imaging results:  Dg Abd Acute W/chest  12/23/2012   *RADIOLOGY REPORT*  Clinical Data: Chest pain and vomiting.  ACUTE ABDOMEN SERIES (ABDOMEN 2 VIEW & CHEST 1 VIEW)  Comparison: 08/10/2012  Findings: The cardiomediastinal silhouette is unremarkable. There is no evidence of airspace disease, pleural effusion or pneumothorax.  A paucity of gas within the abdomen is noted. There is no evidence of pneumoperitoneum. No dilated bowel loops are identified. No acute bony abnormalities are present. No suspicious calcifications are noted.  IMPRESSION: No evidence of acute or significant abnormality.  Original Report Authenticated By: Harmon Pier, M.D.    Other results: EKG: normal sinus rhythm, normal EKG   Assessment & Plan by Problem:  1.  Nausea and vomiting with associated substernal and abdominal pain.  Patient feels better this morning with no further nausea and vomiting.  The etiology is not clear; differential includes viral gastroenteritis; upper GI process such as gastritis, esophagitis, or peptic ulcer; cocaine toxicity given positive urine drug screen; other process.  Given his clinical improvement, the plan is to give clear liquids and advance diet if tolerated; IV normal saline; PPI; follow orthostatics;  symptomatic treatment if needed.  If he has further problems, then a CT scan of the abdomen and an upper GI workup would be warranted.  2.  Kidney mass.  Patient has a left renal mass noted on prior abdominal CT scans, and he reports that he has been followed in the past by a urologist who was doing serial ultrasounds.  He has not seen a urologist by his report in about 4 years.  I advised him of the importance of following up with urology in the near future, and he agreed to do so.  3.  Bipolar affective disorder.  Patient reports that he is doing well and denies any recent suicidal ideation.  4.  Substance abuse.  Social work consult; counsel regarding abstinence.  5.  Other problems and plans as per the resident physician's note.  6.  Dr. Eben Burow will return as attending physician tomorrow 12/25/2012.

## 2012-12-25 DIAGNOSIS — F411 Generalized anxiety disorder: Secondary | ICD-10-CM

## 2012-12-25 DIAGNOSIS — F319 Bipolar disorder, unspecified: Secondary | ICD-10-CM

## 2012-12-25 DIAGNOSIS — R079 Chest pain, unspecified: Secondary | ICD-10-CM

## 2012-12-25 DIAGNOSIS — F1010 Alcohol abuse, uncomplicated: Secondary | ICD-10-CM

## 2012-12-25 DIAGNOSIS — N289 Disorder of kidney and ureter, unspecified: Secondary | ICD-10-CM

## 2012-12-25 DIAGNOSIS — K5289 Other specified noninfective gastroenteritis and colitis: Secondary | ICD-10-CM

## 2012-12-25 MED ORDER — SIMVASTATIN 40 MG PO TABS: 20.0000 mg | ORAL_TABLET | Freq: Every evening | ORAL | Status: DC

## 2012-12-25 NOTE — Progress Notes (Signed)
Pt refusing to get up to complete orthostatics s/t dizziness. States he wants to wait until later. Will continue to monitor.

## 2012-12-25 NOTE — Progress Notes (Signed)
Pt signed d/c papers. IV removed. Room checked for belongings. Pt medications from pharmacy returned. Escorted to vehicle via NT.

## 2012-12-25 NOTE — Progress Notes (Signed)
Subjective: Pt no acute events overnight. Able to tolerate full diet. Still having some dizziness upon standing but markedly improved from yesterday. Pt CP resolving.  Orthostatics not repeated yesterday. Will repeat this AM.   Objective: Vital signs in last 24 hours: Filed Vitals:   12/24/12 1449 12/24/12 1819 12/24/12 2212 12/25/12 0617  BP: 133/93 139/93 125/98 129/91  Pulse: 97 85 96 86  Temp: 98.1 F (36.7 C) 98.6 F (37 C) 98 F (36.7 C) 98.7 F (37.1 C)  TempSrc: Oral Oral Oral Oral  Resp: 18 18 18 18   Height:      Weight:   184 lb 1.4 oz (83.5 kg)   SpO2: 95% 98% 97% 96%   Weight change: 4 lb 1.4 oz (1.853 kg)  Intake/Output Summary (Last 24 hours) at 12/25/12 0800 Last data filed at 12/25/12 0618  Gross per 24 hour  Intake   4200 ml  Output   3600 ml  Net    600 ml   General: resting in bed, NAD HEENT: PERRL, EOMI, no scleral icterus Cardiac: RRR, no rubs, murmurs or gallops Pulm: clear to auscultation bilaterally, moving normal volumes of air Abd: soft, minimal ttp in LLQ, no rebound or guarding, nondistended, BS present Ext: warm and well perfused, no pedal edema Neuro: alert and oriented X3, cranial nerves II-XII grossly intact  Lab Results: Basic Metabolic Panel:  Recent Labs Lab 12/23/12 1958 12/24/12 0210  NA 140 140  K 5.4* 3.9  CL 108 105  CO2 22 26  GLUCOSE 105* 100*  BUN 8 9  CREATININE 0.78 0.81  CALCIUM 9.4 9.3  MG  --  1.5   Liver Function Tests:  Recent Labs Lab 12/23/12 1958 12/24/12 0210  AST 81* 53*  ALT 60* 53  ALKPHOS 60 68  BILITOT 0.5 0.5  PROT 6.9 6.7  ALBUMIN 3.6 3.6    Recent Labs Lab 12/23/12 1958  LIPASE 15   CBC:  Recent Labs Lab 12/23/12 2010  WBC 6.4  NEUTROABS 5.2  HGB 15.6  HCT 44.2  MCV 84.5  PLT 209   Cardiac Enzymes:  Recent Labs Lab 12/24/12 0210 12/24/12 0730 12/24/12 1352  TROPONINI <0.30 <0.30 <0.30   Fasting Lipid Panel:  Recent Labs Lab 12/24/12 0210  CHOL 309*  HDL 85   LDLCALC 213*  TRIG 56  CHOLHDL 3.6   Urine Drug Screen: Drugs of Abuse     Component Value Date/Time   LABOPIA NONE DETECTED 12/24/2012 0108   COCAINSCRNUR POSITIVE* 12/24/2012 0108   LABBENZ NONE DETECTED 12/24/2012 0108   AMPHETMU NONE DETECTED 12/24/2012 0108   THCU NONE DETECTED 12/24/2012 0108   LABBARB NONE DETECTED 12/24/2012 0108    Alcohol Level:  Recent Labs Lab 12/23/12 2226  ETH <11   Micro Results: No results found for this or any previous visit (from the past 240 hour(s)). Studies/Results: Dg Abd Acute W/chest  12/23/2012   *RADIOLOGY REPORT*  Clinical Data: Chest pain and vomiting.  ACUTE ABDOMEN SERIES (ABDOMEN 2 VIEW & CHEST 1 VIEW)  Comparison: 08/10/2012  Findings: The cardiomediastinal silhouette is unremarkable. There is no evidence of airspace disease, pleural effusion or pneumothorax.  A paucity of gas within the abdomen is noted. There is no evidence of pneumoperitoneum. No dilated bowel loops are identified. No acute bony abnormalities are present. No suspicious calcifications are noted.  IMPRESSION: No evidence of acute or significant abnormality.   Original Report Authenticated By: Harmon Pier, M.D.   Medications: I have reviewed the patient's  current medications. Scheduled Meds: . amLODipine  10 mg Oral Daily  . antiseptic oral rinse  15 mL Mouth Rinse BID  . ARIPiprazole  2 mg Oral Daily  . atorvastatin  20 mg Oral QPM  . enoxaparin (LOVENOX) injection  40 mg Subcutaneous Q24H  . folic acid  1 mg Oral Daily  . multivitamin with minerals  1 tablet Oral Daily  . pantoprazole (PROTONIX) IV  40 mg Intravenous Q24H  . sodium chloride  3 mL Intravenous Q12H  . thiamine  100 mg Oral Daily  . traZODone  150 mg Oral QHS   Continuous Infusions: . sodium chloride 150 mL/hr at 12/25/12 0545   PRN Meds:.acetaminophen, acetaminophen, alum & mag hydroxide-simeth, diphenhydrAMINE, LORazepam, LORazepam, ondansetron (ZOFRAN) IV, ondansetron, witch  hazel-glycerin Assessment/Plan: #Gastroenteritis-resloving: Pt ate some food at restaurant and both pt and friend got sick with vomiting and abd pain. Pt feeling orthostatic and had marked decreased po intake. Pt continues to not meet SIRS criteria. Having some dizziness upon sitting from lying position but no orthostatics completed on 6/8.  -NS 150cc -orthostatics -regular diet  #HTN: Pt normotensive while inpt and probably volume depleted in setting of decreased po intake.  -cont to monitor -repeat orthostatics -hold amlodipine 10mg   # renal lesion: previous CT scan from 03/2012 showed a left anterior kidney mass that has slowly enlarged since the last CT scan in 2011. 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 No RP nodes or mets to lower lung fields were found. The images were reviews with Dr. Norva Pavlov of radiology on 12/24/12 and RCC can't be fully ruled out but lesion more characteristic of oncocytoma and that no other imaging modality would better characterize mass. Recommend f/u with urology as outpt for possible removal.  -f/u with outpt urology   Dispo: Disposition is deferred at this time, awaiting improvement of current medical problems.  Anticipated discharge in approximately 1-2 day(s).   The patient does have a current PCP (VOLLMER, KELLY, MD), therefore will be requiring OPC follow-up after discharge.   The patient does not have transportation limitations that hinder transportation to clinic appointments.  .Services Needed at time of discharge: Y = Yes, Blank = No PT:   OT:   RN:   Equipment:   Other:     LOS: 2 days   Christen Bame 12/25/2012, 8:00 AM Pgr: [phone]
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
## 2012-12-25 NOTE — Progress Notes (Signed)
Internal Medicine Teaching Service Attending Note Date: 12/25/2012  Patient name: [name]  Medical record number: [id number]  Date of birth: 02/08/1981    This patient has been seen and discussed with the house staff. Please see their note for complete details. I concur with their findings with the following additions/corrections: Patient was admitted with nausea,vomiting and abdominal pain, all of which have resolved now and patient is tolerating full diet. Patient needs evaluation of his kidney mass. We plan to call urology today for their recommendations. Patient has not had a follow up due to not being insured. He will likely need to [facility] at [facility] hospital at discharge.   Lars Mage 12/25/2012, 1:46 PM

## 2012-12-25 NOTE — Progress Notes (Signed)
Pt continues to complain of feeling "swimmy-headed" however pt is able to change positions from lying to sitting quickly (pt went from lying to sitting at a 90 degree angle to take medication) without any obvious signs of intolerance. No complaints of n/v. MD aware, will continue to monitor.

## 2013-01-02 ENCOUNTER — Other Ambulatory Visit: Payer: Self-pay | Admitting: Internal Medicine

## 2013-01-02 ENCOUNTER — Encounter: Payer: Self-pay | Admitting: Internal Medicine

## 2013-01-02 ENCOUNTER — Observation Stay (HOSPITAL_COMMUNITY)
Admission: AD | Admit: 2013-01-02 | Discharge: 2013-01-03 | Disposition: A | Discharge status: Home / Self Care | Payer: MEDICAID | Source: Ambulatory Visit | Attending: Internal Medicine | Admitting: Internal Medicine

## 2013-01-02 ENCOUNTER — Ambulatory Visit (INDEPENDENT_AMBULATORY_CARE_PROVIDER_SITE_OTHER): Payer: Self-pay | Admitting: Internal Medicine

## 2013-01-02 VITALS — BP 137/98 | HR 124 | Temp 97.4°F | Ht 66.25 in | Wt 185.2 lb

## 2013-01-02 DIAGNOSIS — D72819 Decreased white blood cell count, unspecified: Secondary | ICD-10-CM | POA: Diagnosis present

## 2013-01-02 DIAGNOSIS — Z79899 Other long term (current) drug therapy: Secondary | ICD-10-CM | POA: Insufficient documentation

## 2013-01-02 DIAGNOSIS — E785 Hyperlipidemia, unspecified: Secondary | ICD-10-CM | POA: Insufficient documentation

## 2013-01-02 DIAGNOSIS — F1410 Cocaine abuse, uncomplicated: Secondary | ICD-10-CM | POA: Insufficient documentation

## 2013-01-02 DIAGNOSIS — F319 Bipolar disorder, unspecified: Secondary | ICD-10-CM | POA: Insufficient documentation

## 2013-01-02 DIAGNOSIS — F339 Major depressive disorder, recurrent, unspecified: Secondary | ICD-10-CM | POA: Insufficient documentation

## 2013-01-02 DIAGNOSIS — N289 Disorder of kidney and ureter, unspecified: Secondary | ICD-10-CM | POA: Insufficient documentation

## 2013-01-02 DIAGNOSIS — R55 Syncope and collapse: Principal | ICD-10-CM | POA: Insufficient documentation

## 2013-01-02 DIAGNOSIS — R1110 Vomiting, unspecified: Secondary | ICD-10-CM

## 2013-01-02 DIAGNOSIS — F411 Generalized anxiety disorder: Secondary | ICD-10-CM | POA: Insufficient documentation

## 2013-01-02 DIAGNOSIS — R1033 Periumbilical pain: Secondary | ICD-10-CM | POA: Insufficient documentation

## 2013-01-02 DIAGNOSIS — R195 Other fecal abnormalities: Secondary | ICD-10-CM | POA: Insufficient documentation

## 2013-01-02 DIAGNOSIS — I10 Essential (primary) hypertension: Secondary | ICD-10-CM | POA: Insufficient documentation

## 2013-01-02 DIAGNOSIS — N2889 Other specified disorders of kidney and ureter: Secondary | ICD-10-CM

## 2013-01-02 DIAGNOSIS — F419 Anxiety disorder, unspecified: Secondary | ICD-10-CM

## 2013-01-02 DIAGNOSIS — F1910 Other psychoactive substance abuse, uncomplicated: Secondary | ICD-10-CM

## 2013-01-02 DIAGNOSIS — F1010 Alcohol abuse, uncomplicated: Secondary | ICD-10-CM

## 2013-01-02 LAB — RAPID URINE DRUG SCREEN, HOSP PERFORMED
Amphetamines: NOT DETECTED
Tetrahydrocannabinol: NOT DETECTED

## 2013-01-02 LAB — CBC WITH DIFFERENTIAL/PLATELET
Eosinophils Absolute: 0.1 10*3/uL (ref 0.0–0.7)
Hemoglobin: 16.3 g/dL (ref 13.0–17.0)
Lymphocytes Relative: 56 % — ABNORMAL HIGH (ref 12–46)
Lymphs Abs: 1.6 10*3/uL (ref 0.7–4.0)
Monocytes Relative: 9 % (ref 3–12)
Neutro Abs: 0.9 10*3/uL — ABNORMAL LOW (ref 1.7–7.7)
Neutrophils Relative %: 32 % — ABNORMAL LOW (ref 43–77)
Platelets: 186 10*3/uL (ref 150–400)
RBC: 5.28 MIL/uL (ref 4.22–5.81)
WBC: 2.8 10*3/uL — ABNORMAL LOW (ref 4.0–10.5)

## 2013-01-02 LAB — PROTIME-INR
INR: 0.96 (ref <1.50)
Prothrombin Time: 12.7 seconds (ref 11.6–15.2)

## 2013-01-02 LAB — COMPLETE METABOLIC PANEL WITH GFR
ALT: 71 U/L — ABNORMAL HIGH (ref 0–53)
Albumin: 3.6 g/dL (ref 3.5–5.2)
CO2: 25 mEq/L (ref 19–32)
GFR, Est African American: >89 mL/min
Glucose, Bld: 87 mg/dL (ref 70–99)
Potassium: 3.8 mEq/L (ref 3.5–5.3)
Sodium: 139 mEq/L (ref 135–145)
Total Bilirubin: 0.3 mg/dL (ref 0.3–1.2)
Total Protein: 6.9 g/dL (ref 6.0–8.3)

## 2013-01-02 LAB — OCCULT BLOOD X 1 CARD TO LAB, STOOL: Fecal Occult Bld: NEGATIVE

## 2013-01-02 MED ORDER — ONDANSETRON HCL 4 MG PO TABS: 4.0000 mg | ORAL_TABLET | Freq: Four times a day (QID) | ORAL | Status: DC | PRN

## 2013-01-02 MED ORDER — SODIUM CHLORIDE 0.9 % IJ SOLN
3.0000 mL | Freq: Two times a day (BID) | INTRAMUSCULAR | Status: DC
  Administered 2013-01-02: 3 mL via INTRAVENOUS

## 2013-01-02 MED ORDER — OXYCODONE HCL 5 MG PO TABS
5.0000 mg | ORAL_TABLET | Freq: Four times a day (QID) | ORAL | Status: DC | PRN
  Administered 2013-01-02 – 2013-01-03 (×4): 5 mg via ORAL
  Filled 2013-01-02 (×4): qty 1

## 2013-01-02 MED ORDER — TRAZODONE HCL 150 MG PO TABS
150.0000 mg | ORAL_TABLET | Freq: Every day | ORAL | Status: DC
  Administered 2013-01-02: 150 mg via ORAL
  Filled 2013-01-02 (×2): qty 1

## 2013-01-02 MED ORDER — ENOXAPARIN SODIUM 40 MG/0.4ML ~~LOC~~ SOLN: 40.0000 mg | SUBCUTANEOUS | Status: DC

## 2013-01-02 MED ORDER — FOLIC ACID 1 MG PO TABS
1.0000 mg | ORAL_TABLET | Freq: Every day | ORAL | Status: DC
  Administered 2013-01-02 – 2013-01-03 (×2): 1 mg via ORAL
  Filled 2013-01-02 (×2): qty 1

## 2013-01-02 MED ORDER — ONDANSETRON HCL 4 MG/2ML IJ SOLN
4.0000 mg | Freq: Four times a day (QID) | INTRAMUSCULAR | Status: DC | PRN
  Administered 2013-01-02: 4 mg via INTRAVENOUS
  Filled 2013-01-02: qty 2

## 2013-01-02 MED ORDER — ADULT MULTIVITAMIN W/MINERALS CH
1.0000 | ORAL_TABLET | Freq: Every day | ORAL | Status: DC
  Administered 2013-01-02 – 2013-01-03 (×2): 1 via ORAL
  Filled 2013-01-02 (×2): qty 1

## 2013-01-02 MED ORDER — LORAZEPAM 1 MG PO TABS
1.0000 mg | ORAL_TABLET | Freq: Four times a day (QID) | ORAL | Status: DC | PRN
  Administered 2013-01-02 – 2013-01-03 (×2): 1 mg via ORAL
  Filled 2013-01-02 (×2): qty 1

## 2013-01-02 MED ORDER — ALUM & MAG HYDROXIDE-SIMETH 200-200-20 MG/5ML PO SUSP: 30.0000 mL | Freq: Four times a day (QID) | ORAL | Status: DC | PRN

## 2013-01-02 MED ORDER — PANTOPRAZOLE SODIUM 40 MG IV SOLR
40.0000 mg | INTRAVENOUS | Status: DC
  Administered 2013-01-02: 40 mg via INTRAVENOUS
  Filled 2013-01-02 (×2): qty 40

## 2013-01-02 MED ORDER — VITAMIN B-1 100 MG PO TABS
100.0000 mg | ORAL_TABLET | Freq: Every day | ORAL | Status: DC
  Administered 2013-01-02 – 2013-01-03 (×2): 100 mg via ORAL
  Filled 2013-01-02 (×2): qty 1

## 2013-01-02 MED ORDER — SIMVASTATIN 20 MG PO TABS: 20.0000 mg | ORAL_TABLET | Freq: Every evening | ORAL | Status: DC

## 2013-01-02 MED ORDER — THIAMINE HCL 100 MG/ML IJ SOLN
100.0000 mg | Freq: Every day | INTRAMUSCULAR | Status: DC
  Filled 2013-01-02 (×2): qty 1

## 2013-01-02 MED ORDER — SODIUM CHLORIDE 0.9 % IV SOLN
INTRAVENOUS | Status: DC
  Administered 2013-01-02 – 2013-01-03 (×3): via INTRAVENOUS

## 2013-01-02 MED ORDER — GI COCKTAIL ~~LOC~~
30.0000 mL | Freq: Once | ORAL | Status: AC
  Administered 2013-01-02: 30 mL via ORAL
  Filled 2013-01-02: qty 30

## 2013-01-02 MED ORDER — LORAZEPAM 2 MG/ML IJ SOLN: 1.0000 mg | Freq: Four times a day (QID) | INTRAMUSCULAR | Status: DC | PRN

## 2013-01-02 NOTE — Progress Notes (Signed)
Pt taken via w/c to Room 2001 with belongings after report given to nurse. #22 NSL inserted left forearm. Stanton Kidney Philomene Haff RN 01/02/13 2PM

## 2013-01-02 NOTE — Patient Instructions (Signed)
Admit to inpatient.
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
## 2013-01-02 NOTE — Progress Notes (Signed)
I discussed patient with resident Dr. Li, and I agree with the plans as outlined in her note. 

## 2013-01-02 NOTE — Assessment & Plan Note (Signed)
Patient is admitted to hospital for evaluation of syncope and possible upper GIB on 01/02/13. Please see my OV note.
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
## 2013-01-03 ENCOUNTER — Other Ambulatory Visit: Payer: Self-pay

## 2013-01-03 ENCOUNTER — Encounter (HOSPITAL_COMMUNITY): Payer: Self-pay | Admitting: Family Medicine

## 2013-01-03 DIAGNOSIS — R55 Syncope and collapse: Secondary | ICD-10-CM

## 2013-01-03 DIAGNOSIS — N289 Disorder of kidney and ureter, unspecified: Secondary | ICD-10-CM

## 2013-01-03 DIAGNOSIS — F1010 Alcohol abuse, uncomplicated: Secondary | ICD-10-CM

## 2013-01-03 DIAGNOSIS — F339 Major depressive disorder, recurrent, unspecified: Secondary | ICD-10-CM

## 2013-01-03 DIAGNOSIS — F411 Generalized anxiety disorder: Secondary | ICD-10-CM

## 2013-01-03 DIAGNOSIS — I10 Essential (primary) hypertension: Secondary | ICD-10-CM

## 2013-01-03 DIAGNOSIS — D72819 Decreased white blood cell count, unspecified: Secondary | ICD-10-CM

## 2013-01-03 LAB — COMPREHENSIVE METABOLIC PANEL
ALT: 50 U/L (ref 0–53)
AST: 47 U/L — ABNORMAL HIGH (ref 0–37)
Albumin: 3.1 g/dL — ABNORMAL LOW (ref 3.5–5.2)
CO2: 25 mEq/L (ref 19–32)
Calcium: 8.4 mg/dL (ref 8.4–10.5)
Chloride: 103 mEq/L (ref 96–112)
GFR calc non Af Amer: >90 mL/min (ref >90)
Sodium: 135 mEq/L (ref 135–145)

## 2013-01-03 LAB — HEPATITIS PANEL, ACUTE
HCV Ab: NEGATIVE
Hep A IgM: NEGATIVE

## 2013-01-03 LAB — CBC
MCH: 29.8 pg (ref 26.0–34.0)
Platelets: 153 10*3/uL (ref 150–400)
RBC: 4.73 MIL/uL (ref 4.22–5.81)
RDW: 15.2 % (ref 11.5–15.5)
WBC: 2.8 10*3/uL — ABNORMAL LOW (ref 4.0–10.5)

## 2013-01-03 MED ORDER — POTASSIUM CHLORIDE CRYS ER 20 MEQ PO TBCR
30.0000 meq | EXTENDED_RELEASE_TABLET | Freq: Once | ORAL | Status: AC
  Administered 2013-01-03: 30 meq via ORAL
  Filled 2013-01-03: qty 1

## 2013-01-03 MED ORDER — THIAMINE HCL 100 MG PO TABS: 100.0000 mg | ORAL_TABLET | Freq: Every day | ORAL | Status: DC

## 2013-01-03 MED ORDER — PANTOPRAZOLE SODIUM 40 MG PO TBEC
40.0000 mg | DELAYED_RELEASE_TABLET | Freq: Every day | ORAL | Status: DC
  Administered 2013-01-03: 40 mg via ORAL
  Filled 2013-01-03: qty 1

## 2013-01-03 MED ORDER — FOLIC ACID 1 MG PO TABS: 1.0000 mg | ORAL_TABLET | Freq: Every day | ORAL | Status: DC

## 2013-01-03 MED ORDER — ADULT MULTIVITAMIN W/MINERALS CH: 1.0000 | ORAL_TABLET | Freq: Every day | ORAL | Status: DC

## 2013-01-03 MED ORDER — PANTOPRAZOLE SODIUM 40 MG PO TBEC: 40.0000 mg | DELAYED_RELEASE_TABLET | Freq: Every day | ORAL | Status: DC

## 2013-01-03 MED ORDER — POTASSIUM CHLORIDE 10 MEQ/100ML IV SOLN
10.0000 meq | INTRAVENOUS | Status: DC
  Administered 2013-01-03: 10 meq via INTRAVENOUS
  Filled 2013-01-03 (×3): qty 100

## 2013-01-03 MED ORDER — ALUM & MAG HYDROXIDE-SIMETH 200-200-20 MG/5ML PO SUSP: 30.0000 mL | Freq: Four times a day (QID) | ORAL | Status: DC | PRN

## 2013-01-03 NOTE — Discharge Summary (Signed)
Name: [name] MRN: [id number] DOB: 09-Nov-1980 32 y.o. PCP: No Pcp Per Patient  Date of Admission: 01/02/2013  2:17 PM Date of Discharge: 01/03/2013 Attending Physician: Jonah Blue, DO  Discharge Diagnosis: Principal Problem:   Syncope Active Problems:   HYPERTENSION   Major depressive disorder, recurrent episode   Anxiety disorder   Vomiting   Leukocytopenia, unspecified  Discharge Medications:   Medication List    TAKE these medications       alum & mag hydroxide-simeth 200-200-20 MG/5ML suspension  Commonly known as:  MAALOX/MYLANTA  Take 30 mLs by mouth every 6 (six) hours as needed.     amLODipine 10 MG tablet  Commonly known as:  NORVASC  Take 10 mg by mouth daily.     ARIPiprazole 2 MG tablet  Commonly known as:  ABILIFY  Take 4 mg by mouth at bedtime.     folic acid 1 MG tablet  Commonly known as:  FOLVITE  Take 1 tablet (1 mg total) by mouth daily.     gabapentin 100 MG capsule  Commonly known as:  NEURONTIN  Take 100 mg by mouth 2 (two) times daily.     metoprolol tartrate 25 MG tablet  Commonly known as:  LOPRESSOR  Take 25 mg by mouth 2 (two) times daily.     multivitamin with minerals Tabs  Take 1 tablet by mouth daily.     pantoprazole 40 MG tablet  Commonly known as:  PROTONIX  Take 1 tablet (40 mg total) by mouth daily.     simvastatin 40 MG tablet  Commonly known as:  ZOCOR  Take 40 mg by mouth every evening.     thiamine 100 MG tablet  Take 1 tablet (100 mg total) by mouth daily.     traZODone 100 MG tablet  Commonly known as:  DESYREL  Take 200 mg by mouth at bedtime.        Disposition and follow-up:   [name] was discharged from [facility] in Stable condition.  At the hospital follow up visit please address:  1.  The cerebral atrophy seen on CT head, likely 2/2 alcoholism and reportedly his previous EGD revealed esophageal varices- please address EtOH abuse and abstinence. Also, please have  his renal mass evaluated- he is scheduled for a Urology f/u in the near future, please make sure he went to the appointment.  2.  Labs / imaging needed at time of follow-up: Check a BMP, b/c he was mildly hypokalemic this admission. Also check a CBC 2/2 leukopenia.  3.  Pending labs/ test needing follow-up: Blood cultures   Follow-up Appointments: Follow-up Information   Follow up with STEELE,ANTHONY, FNP On 01/10/2013. (at 1:30pm)    Contact information:   [address] [place], [place] [address] [phone]      Discharge Instructions: Discharge Orders   Future Appointments Provider Department Dept Phone   01/12/2013 3:00 PM [facility] [phone]   01/12/2013 3:30 PM [facility] [phone]   01/12/2013 4:00 PM Evern Bio, NP [facility] [phone]   Future Orders Complete By Expires     Call MD for:  persistant dizziness or light-headedness  As directed     Call MD for:  persistant nausea and vomiting  As directed     Call MD for:  temperature >100.4  As directed     Diet general  As directed  Discharge instructions  As directed     Comments:      **Avoid alcohol and cocaine. Please be sure to follow up with your primary care provider. This is very important, because you need close follow up with him given your alcohol use and brain imaging results likely due to alcohol abuse.  **Please be sure to resume your Abilify, Trazodone, and gabapentin after discharge.    Increase activity slowly  As directed        Consultations:  None  Procedures Performed:  Dg Abd Acute W/chest  12/23/2012   *RADIOLOGY REPORT*  Clinical Data: Chest pain and vomiting.  ACUTE ABDOMEN SERIES (ABDOMEN 2 VIEW & CHEST 1 VIEW)  Comparison: 08/10/2012  Findings: The cardiomediastinal silhouette is unremarkable. There is no evidence of airspace disease, pleural effusion or  pneumothorax.  A paucity of gas within the abdomen is noted. There is no evidence of pneumoperitoneum. No dilated bowel loops are identified. No acute bony abnormalities are present. No suspicious calcifications are noted.  IMPRESSION: No evidence of acute or significant abnormality.   Original Report Authenticated By: Harmon Pier, M.D.   Admission  History of Present Illness: [name] is a 32 year old man who presents to clinic today complaining of syncope yesterday along with 1 episode of vomiting with brown vomitus, abdominal pain, and some blood as well as a black,tarry stool today.  He states that yesterday he went to get out of bed and felt "swimmy headed" and then next thing he knew he was on the floor. He doesn't know how long he was down. Following that he had an episode of vomiting which he states was brown/blackish with blood in it. He states that he only threw up that one time. He states that in the clinic today he had a black, tarry bowel movement as well but didn't tell anyone. He states that after he vomited he started to get stomach pain around his umbilicus that is worse whenever he walks. He denies changes to the abdominal pain with eating, passing flatus, or having a BM. He states that pain is a 10/10 and is a sharp/throbbing pain.  Review of Systems:  Constitutional: Positive for chills. Denies fever, diaphoresis, appetite change and fatigue.  HEENT: Denies photophobia, eye pain, redness, hearing loss, ear pain, congestion, sore throat, rhinorrhea, sneezing, mouth sores, trouble swallowing, neck pain, neck stiffness and tinnitus.  Respiratory: Denies SOB, DOE, cough, chest tightness, and wheezing.  Cardiovascular: Denies chest pain, palpitations and leg swelling.  Gastrointestinal: Positive for nausea, vomiting, abdominal pain, black tarry stools, and blood in his vomitus. Denies diarrhea, constipation, and abdominal distention.  Genitourinary: Denies dysuria, urgency, frequency,  hematuria, flank pain and difficulty urinating.  Endocrine: Denies: hot or cold intolerance, sweats, changes in hair or nails, polyuria, polydipsia.  Musculoskeletal: Denies myalgias, back pain, joint swelling, arthralgias and gait problem.  Skin: Denies pallor, rash and wound.  Neurological: Denies dizziness, seizures, syncope, weakness, light-headedness, numbness and headaches.  Hematological: Denies adenopathy. Easy bruising, personal or family bleeding history  Psychiatric/Behavioral: Denies suicidal ideation, mood changes, confusion, nervousness, sleep disturbance and agitation  Physical Exam:  Blood pressure 153/112, pulse 86, temperature 98.5 F (36.9 C), temperature source Oral, resp. rate 18, SpO2 94.00%.  Constitutional: Vital signs reviewed. Patient is a well-developed and well-nourished man in no acute distress and cooperative with exam. Alert and oriented x3.  Head: Normocephalic and atraumatic  Ear: TM normal bilaterally  Nose: No erythema or drainage noted. Turbinates normal  Mouth:  no erythema or exudates, MMM  Eyes: PERRL, EOMI, conjunctivae normal, No scleral icterus. No nystagmus  Neck: Supple, Trachea midline normal ROM, No JVD, mass, thyromegaly, or carotid bruit present.  Cardiovascular: RRR, S1 normal, S2 normal, no MRG, pulses symmetric and intact bilaterally  Pulmonary/Chest: normal respiratory effort, CTAB, no wheezes, rales, or rhonchi  Abdominal: Soft. Mild periumbical abdominal pain to palpation, non-distended, bowel sounds are normal, no masses, organomegaly, or guarding present.  GU: no CVA tenderness Musculoskeletal: No joint deformities, erythema, or stiffness, ROM full and no nontender Hematology: no cervical, inginal, or axillary adenopathy.  Neurological: A&O x3, Strength is normal and symmetric bilaterally, cranial nerve II-XII are grossly intact, no focal motor deficit, sensory intact to light touch bilaterally.  Skin: Warm, dry and intact. No rash,  cyanosis, or clubbing.  Psychiatric: Normal mood and full affect. speech and behavior is normal. Judgment and thought content normal. Cognition and memory are normal.    Hospital Course by problem list: [name] is a 32 year old man who presents to the [facility] ED complaining of vomiting, abdominal pain, and chest pain.   1. 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 He has also had an episode of syncope. He states that he feels much better since admission. He has not had any more vomiting since he threw up what appeared to be more like clear sputum than vomitus soon after admission. His abdominal pain and syncope have also resolved. He does have a h/o of EtOH abuse and esophageal varices were seen on previous EGD. Blood alcohol level was 96 on admission, so theses symptoms are likely related to EtOH use/intoxication. He has been counseled on EtOH and cocaine cessation while in the hospital and states he is going to cut back on his usage.  - Diet as tolerated  - Protonix 40mg  po day - Alcohol and cocaine cessation  2. Alcohol and substance abuse: Long history of EtOH and cocaine use. He was positive for both on admission. CT head showed cerebral and cerebellar atrophy, likely from his alcohol abuse. Alcohol and cocaine counseling provided this admission. He is to f/u with his PCP as a hospital f/u to further discuss his substance abuse.   3. Kidney mass: Seen on previous CT scan from 03/2012, which showed a left anterior kidney mass that has slowly enlarged since the last CT scan in 2011. 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 This has been discussed with the patient that he needs to have this followed up on as an outpatient and will likely need additional imaging and possible biopsy of the mass. Pt has an appt with Urology as an outpatient which was previously scheduled.   4. Leukopenia: This is chronic, but his  white count is down to 2.8 this admission. HIV negative this admission. Checking blood cultures in case his symptoms are related to infection and he is unable to mount an appropriate response. Will need to follow CBC as outpatient. Will also need to f/u blood cultures after discharge.  5. Hypertension: Held home HTN meds 2/2 orthostasis on admission. BP elevated overnight. Orthostasis resolved with IVF, and restarted home medications prior to d/c home.  - Amlodipine 10mg  po daily - Lopressor 25mg  po daily  6. Bipolar affective disorder: Pt is being followed at [facility] and states that he is taking Trazodone, Abilify, and Gabapentin, but we do not have any record of theses medications in the system. He was  asked to have his mother bring in the pill bottles but did not do this. Trazodone was started overnight but the Abilify and gabapentin were held until we received the correct dosing. He is to resume these medications at discharge.  7. Hyperlipidemia: He is on Simvastatin at home. Started Lipitor on admission 2/2 pharmacy formulary. Resuming his Simvastatin at discharge.  - Simvastatin 40mg  po daily   Discharge Vitals:   BP 138/99  Pulse 94  Temp(Src) 98.5 F (36.9 C) (Oral)  Resp 18  Ht 5' 6.25" (1.683 m)  Wt 185 lb 3.2 oz (84.006 kg)  BMI 29.66 kg/m2  SpO2 97%  Discharge Labs:  Results for orders placed during the hospital encounter of 01/02/13 (from the past 24 hour(s))  OCCULT BLOOD X 1 CARD TO LAB, STOOL     Status: None   Collection Time    01/02/13  4:00 PM      Result Value Range   Fecal Occult Bld NEGATIVE  NEGATIVE  URINE RAPID DRUG SCREEN (HOSP PERFORMED)     Status: Abnormal   Collection Time    01/02/13  5:22 PM      Result Value Range   Opiates NONE DETECTED  NONE DETECTED   Cocaine POSITIVE (*) NONE DETECTED   Benzodiazepines NONE DETECTED  NONE DETECTED   Amphetamines NONE DETECTED  NONE DETECTED   Tetrahydrocannabinol NONE DETECTED  NONE DETECTED   Barbiturates  NONE DETECTED  NONE DETECTED  HEPATITIS PANEL, ACUTE     Status: None   Collection Time    01/02/13  6:16 PM      Result Value Range   Hepatitis B Surface Ag NEGATIVE  NEGATIVE   HCV Ab NEGATIVE  NEGATIVE   Hep A IgM PENDING  NEGATIVE   Hep B C IgM PENDING  NEGATIVE  HIV ANTIBODY (ROUTINE TESTING)     Status: None   Collection Time    01/02/13  6:16 PM      Result Value Range   HIV NON REACTIVE  NON REACTIVE  ACETAMINOPHEN LEVEL     Status: None   Collection Time    01/02/13  6:16 PM      Result Value Range   Acetaminophen (Tylenol), Serum <15.0  10 - 30 ug/mL  CBC     Status: Abnormal   Collection Time    01/03/13  6:25 AM      Result Value Range   WBC 2.8 (*) 4.0 - 10.5 K/uL   RBC 4.73  4.22 - 5.81 MIL/uL   Hemoglobin 14.1  13.0 - 17.0 g/dL   HCT 69.6  29.5 - 28.4 %   MCV 84.1  78.0 - 100.0 fL   MCH 29.8  26.0 - 34.0 pg   MCHC 35.4  30.0 - 36.0 g/dL   RDW 13.2  44.0 - 10.2 %   Platelets 153  150 - 400 K/uL  COMPREHENSIVE METABOLIC PANEL     Status: Abnormal   Collection Time    01/03/13  6:25 AM      Result Value Range   Sodium 135  135 - 145 mEq/L   Potassium 3.4 (*) 3.5 - 5.1 mEq/L   Chloride 103  96 - 112 mEq/L   CO2 25  19 - 32 mEq/L   Glucose, Bld 77  70 - 99 mg/dL   BUN 6  6 - 23 mg/dL   Creatinine, Ser 7.25  0.50 - 1.35 mg/dL   Calcium 8.4  8.4 - 36.6 mg/dL  Total Protein 5.6 (*) 6.0 - 8.3 g/dL   Albumin 3.1 (*) 3.5 - 5.2 g/dL   AST 47 (*) 0 - 37 U/L   ALT 50  0 - 53 U/L   Alkaline Phosphatase 51  39 - 117 U/L   Total Bilirubin 0.7  0.3 - 1.2 mg/dL   GFR calc non Af Amer >90  >90 mL/min   GFR calc Af Amer >90  >90 mL/min  LACTIC ACID, PLASMA     Status: None   Collection Time    01/03/13  9:45 AM      Result Value Range   Lactic Acid, Venous 1.6  0.5 - 2.2 mmol/L    Signed: Genelle Gather, MD 01/03/2013, 2:43 PM   Time Spent on Discharge: 35 minutes Services Ordered on Discharge: None Equipment Ordered on Discharge: None

## 2013-01-03 NOTE — Progress Notes (Signed)
Subjective: Agitation overnight, concerning for alcohol withdrawal, CIWA started. Pt denies any further nausea, vomiting, or abdominal pain.   Objective: Vital signs in last 24 hours: Filed Vitals:   01/02/13 2350 01/02/13 2351 01/03/13 0400 01/03/13 0403  BP: 137/92 148/90 131/79 147/82  Pulse: 93 91 76 73  Temp: 98.3 F (36.8 C)  98.4 F (36.9 C)   TempSrc: Oral  Oral   Resp: 18  18   SpO2: 98%  97%    Weight change:   Intake/Output Summary (Last 24 hours) at 01/03/13 1018 Last data filed at 01/03/13 0900  Gross per 24 hour  Intake    360 ml  Output   3100 ml  Net  -2740 ml   Vitals reviewed. General: Resting comfortably in bed, NAD HEENT: PERRL, EOMI, no scleral icterus Cardiac: RRR, no rubs, murmurs or gallops Pulm: Clear to auscultation bilaterally, no wheezes, rales, or rhonchi Abd: Soft, nontender, nondistended, BS present Ext: warm and well perfused, no pedal edema Neuro: alert and oriented X3, cranial nerves II-XII grossly intact, strength and sensation to light touch equal in bilateral upper and lower extremities  Lab Results: Basic Metabolic Panel:  Recent Labs Lab 01/02/13 1201 01/03/13 0625  NA 139 135  K 3.8 3.4*  CL 102 103  CO2 25 25  GLUCOSE 87 77  BUN 6 6  CREATININE 0.88 0.81  CALCIUM 8.7 8.4  MG 1.6  --    Liver Function Tests:  Recent Labs Lab 01/02/13 1201 01/03/13 0625  AST 62* 47*  ALT 71* 50  ALKPHOS 61 51  BILITOT 0.3 0.7  PROT 6.9 5.6*  ALBUMIN 3.6 3.1*   No results found for this basename: LIPASE, AMYLASE,  in the last 168 hours No results found for this basename: AMMONIA,  in the last 168 hours CBC:  Recent Labs Lab 01/02/13 1201 01/03/13 0625  WBC 2.8* 2.8*  NEUTROABS 0.9*  --   HGB 16.3 14.1  HCT 44.9 39.8  MCV 85.0 84.1  PLT 186 153   Coagulation:  Recent Labs Lab 01/02/13 1201  LABPROT 12.7  INR 0.96   Urine Drug Screen: Drugs of Abuse     Component Value Date/Time   LABOPIA NONE DETECTED  01/02/2013 1722   COCAINSCRNUR POSITIVE* 01/02/2013 1722   LABBENZ NONE DETECTED 01/02/2013 1722   AMPHETMU NONE DETECTED 01/02/2013 1722   THCU NONE DETECTED 01/02/2013 1722   LABBARB NONE DETECTED 01/02/2013 1722    Alcohol Level:  Recent Labs Lab 01/02/13 1201  ETH 95*   Urinalysis: Drugs of Abuse     Component Value Date/Time   LABOPIA NONE DETECTED 01/02/2013 1722   COCAINSCRNUR POSITIVE* 01/02/2013 1722   LABBENZ NONE DETECTED 01/02/2013 1722   AMPHETMU NONE DETECTED 01/02/2013 1722   THCU NONE DETECTED 01/02/2013 1722   LABBARB NONE DETECTED 01/02/2013 1722    Medications: I have reviewed the patient's current medications. Scheduled Meds: . folic acid  1 mg Oral Daily  . multivitamin with minerals  1 tablet Oral Daily  . pantoprazole (PROTONIX) IV  40 mg Intravenous Q24H  . potassium chloride  30 mEq Oral Once  . sodium chloride  3 mL Intravenous Q12H  . thiamine  100 mg Oral Daily   Or  . thiamine  100 mg Intravenous Daily  . traZODone  150 mg Oral QHS   Continuous Infusions: . sodium chloride 125 mL/hr at 01/03/13 0059   PRN Meds:.alum & mag hydroxide-simeth, LORazepam, LORazepam, ondansetron (ZOFRAN) IV, ondansetron, oxyCODONE  Assessment/Plan: [name] is a 32 year old man who presents to the [facility] ED complaining of vomiting, abdominal pain, and chest pain.   1. 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 He has also had an episode of syncope. He states that he feels much better since admission. He has not had any more vomiting since he threw up what appeared to be more like clear sputum than vomitus soon after admission. His abdominal pain and syncope have also resolved. He does have a h/o of EtOH abuse and esophageal varices were seen on previous EGD. Blood alcohol level was 96 on admission, so theses symptoms are likely related to EtOH use/intoxication. He has been counseled on EtOH cessation while in the hospital  and states he is going to cut back on his usage.  - Clear liquid diet, advance as tolerated - Protonix   2. Kidney mass: Seen on previous CT scan from 03/2012, which showed a left anterior kidney mass that has slowly enlarged since the last CT scan in 2011. 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 This has been discussed with the patient that he needs to have this followed up on as an outpatient and will likely need additional imaging and possible biopsy of the mass. Pt has an appt with Urology as an outpatient already scheduled.   3. Leukopenia: This is chronic, but his white count is down to 2.8 this admission. HIV negative this admission. Checking blood cultures in case his symptoms are related to infection and he is unable to mount an appropriate response.   4. Hypertension: Held home HTN meds 2/2 orthostasis on admission. BP elevated overnight. Orthostasis resolved with IVF, and restarted home medications prior to d/c home.    5. Bipolar affective disorder: Pt is being followed at [facility] and states that he is taking Trazodone, Abilify, and Gabapentin, but we do not have any record of theses medications in the system. He was asked to have his mother bring in the pill bottles but did not do this. Trazodone was started overnight but the Abilify and gabapentin were held until we get the correct doses.   6. Hyperlipidemia: He is on Simvastatin at home. Started Lipitor on admission 2/2 pharmacy formulary.  7. DVT PPx: Lovenox    Dispo: Disposition is deferred at this time, awaiting improvement of current medical problems.  Anticipated discharge in approximately 0-1 day(s).   The patient does have a current PCP (No Pcp Per Patient) and does not need an [facility] hospital follow-up appointment after discharge.  The patient does not have transportation limitations that hinder transportation to clinic appointments.  .Services Needed at time of discharge: Y = Yes, Blank =  No PT:   OT:   RN:   Equipment:   Other:     LOS: 1 day   [name] Gather, MD 01/03/2013, 10:18 AM

## 2013-01-03 NOTE — Discharge Summary (Signed)
  Date: 01/03/2013  Patient name: [name]  Medical record number: [id number]  Date of birth: 1981-01-18   This patient has been seen and the plan of care was discussed with the house staff. Please see their note for complete details. I concur with their findings and plan.  Jonah Blue, DO 01/03/2013, 3:18 PM
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
## 2013-01-03 NOTE — Care Management Note (Unsigned)
    Page 1 of 1   01/03/2013     1:31:27 PM   CARE MANAGEMENT NOTE 01/03/2013  Patient:  AIRIK, GOODLIN   Account Number:  [id number]  Date Initiated:  01/03/2013  Documentation initiated by:  Hiliary Osorto  Subjective/Objective Assessment:   PT ADM ON 01/02/13 S/P SYNCOPAL EPISODE, ABD PAIN.  POS COCAINE UPON ADMISSION.  PTA, PT INDEPENDENT OF ADLS.     Action/Plan:   WILL FOLLOW FOR HOME NEEDS AS PT PROGRESSES.  CSW CONSULTED FOR SUBSTANCE ABUSE COUNSELING.   Anticipated DC Date:  01/04/2013   Anticipated DC Plan:  HOME/SELF CARE      DC Planning Services  CM consult      Choice offered to / List presented to:             Status of service:  In process, will continue to follow Medicare Important Message given?   (If response is "NO", the following Medicare IM given date fields will be blank) Date Medicare IM given:   Date Additional Medicare IM given:    Discharge Disposition:    Per UR Regulation:  Reviewed for med. necessity/level of care/duration of stay  If discussed at Long Length of Stay Meetings, dates discussed:    Comments:

## 2013-01-09 LAB — CULTURE, BLOOD (ROUTINE X 2)
Culture: NO GROWTH
Culture: NO GROWTH

## 2013-01-12 ENCOUNTER — Encounter (INDEPENDENT_AMBULATORY_CARE_PROVIDER_SITE_OTHER): Payer: Self-pay | Admitting: Vascular Surgery

## 2013-01-12 ENCOUNTER — Ambulatory Visit: Payer: Self-pay | Admitting: Neurosurgery

## 2013-01-12 DIAGNOSIS — I739 Peripheral vascular disease, unspecified: Secondary | ICD-10-CM

## 2013-01-12 DIAGNOSIS — Z48812 Encounter for surgical aftercare following surgery on the circulatory system: Secondary | ICD-10-CM

## 2013-01-15 ENCOUNTER — Other Ambulatory Visit: Payer: Self-pay | Admitting: *Deleted

## 2013-01-15 DIAGNOSIS — Z48812 Encounter for surgical aftercare following surgery on the circulatory system: Secondary | ICD-10-CM

## 2013-01-15 DIAGNOSIS — I739 Peripheral vascular disease, unspecified: Secondary | ICD-10-CM

## 2013-01-16 ENCOUNTER — Encounter (HOSPITAL_COMMUNITY): Payer: Self-pay | Admitting: *Deleted

## 2013-01-16 DIAGNOSIS — F17200 Nicotine dependence, unspecified, uncomplicated: Secondary | ICD-10-CM | POA: Insufficient documentation

## 2013-01-16 DIAGNOSIS — F329 Major depressive disorder, single episode, unspecified: Secondary | ICD-10-CM | POA: Insufficient documentation

## 2013-01-16 DIAGNOSIS — R51 Headache: Secondary | ICD-10-CM | POA: Insufficient documentation

## 2013-01-16 DIAGNOSIS — I2510 Atherosclerotic heart disease of native coronary artery without angina pectoris: Secondary | ICD-10-CM | POA: Insufficient documentation

## 2013-01-16 DIAGNOSIS — Z0279 Encounter for issue of other medical certificate: Secondary | ICD-10-CM

## 2013-01-16 DIAGNOSIS — Z8659 Personal history of other mental and behavioral disorders: Secondary | ICD-10-CM | POA: Insufficient documentation

## 2013-01-16 DIAGNOSIS — E7800 Pure hypercholesterolemia, unspecified: Secondary | ICD-10-CM | POA: Insufficient documentation

## 2013-01-16 DIAGNOSIS — Z8669 Personal history of other diseases of the nervous system and sense organs: Secondary | ICD-10-CM | POA: Insufficient documentation

## 2013-01-16 DIAGNOSIS — Z880 Allergy status to penicillin: Secondary | ICD-10-CM | POA: Insufficient documentation

## 2013-01-16 DIAGNOSIS — F3289 Other specified depressive episodes: Secondary | ICD-10-CM | POA: Insufficient documentation

## 2013-01-16 DIAGNOSIS — K2920 Alcoholic gastritis without bleeding: Secondary | ICD-10-CM | POA: Insufficient documentation

## 2013-01-16 DIAGNOSIS — Z8719 Personal history of other diseases of the digestive system: Secondary | ICD-10-CM | POA: Insufficient documentation

## 2013-01-16 DIAGNOSIS — R072 Precordial pain: Secondary | ICD-10-CM | POA: Insufficient documentation

## 2013-01-16 DIAGNOSIS — Z79899 Other long term (current) drug therapy: Secondary | ICD-10-CM | POA: Insufficient documentation

## 2013-01-16 DIAGNOSIS — R1110 Vomiting, unspecified: Secondary | ICD-10-CM | POA: Insufficient documentation

## 2013-01-16 DIAGNOSIS — Z8679 Personal history of other diseases of the circulatory system: Secondary | ICD-10-CM | POA: Insufficient documentation

## 2013-01-16 DIAGNOSIS — I10 Essential (primary) hypertension: Secondary | ICD-10-CM | POA: Insufficient documentation

## 2013-01-16 DIAGNOSIS — F1910 Other psychoactive substance abuse, uncomplicated: Secondary | ICD-10-CM | POA: Insufficient documentation

## 2013-01-16 LAB — BASIC METABOLIC PANEL
CO2: 20 mEq/L (ref 19–32)
Calcium: 8.9 mg/dL (ref 8.4–10.5)
Creatinine, Ser: 0.81 mg/dL (ref 0.50–1.35)
Glucose, Bld: 90 mg/dL (ref 70–99)

## 2013-01-16 LAB — CBC
Hemoglobin: 16.5 g/dL (ref 13.0–17.0)
MCH: 31.3 pg (ref 26.0–34.0)
MCV: 86 fL (ref 78.0–100.0)
RBC: 5.27 MIL/uL (ref 4.22–5.81)

## 2013-01-16 LAB — POCT I-STAT, CHEM 8
Calcium, Ion: 1.12 mmol/L (ref 1.12–1.23)
Creatinine, Ser: 1 mg/dL (ref 0.50–1.35)
Glucose, Bld: 90 mg/dL (ref 70–99)
Hemoglobin: 17.3 g/dL — ABNORMAL HIGH (ref 13.0–17.0)
TCO2: 21 mmol/L (ref 0–100)

## 2013-01-16 MED ORDER — ONDANSETRON HCL 4 MG PO TABS
8.0000 mg | ORAL_TABLET | Freq: Once | ORAL | Status: AC
  Administered 2013-01-17: 8 mg via ORAL
  Filled 2013-01-16: qty 2

## 2013-01-16 NOTE — ED Notes (Signed)
Started throwing up this morning, followed by substernal cp and h/a.

## 2013-01-17 ENCOUNTER — Encounter: Payer: Self-pay | Admitting: Vascular Surgery

## 2013-01-17 ENCOUNTER — Emergency Department (HOSPITAL_COMMUNITY)
Admission: EM | Admit: 2013-01-17 | Discharge: 2013-01-17 | Disposition: A | Discharge status: Home / Self Care | Payer: Self-pay | Attending: Emergency Medicine | Admitting: Emergency Medicine

## 2013-01-17 ENCOUNTER — Emergency Department (HOSPITAL_COMMUNITY): Payer: Self-pay

## 2013-01-17 DIAGNOSIS — K2920 Alcoholic gastritis without bleeding: Secondary | ICD-10-CM

## 2013-01-17 MED ORDER — FAMOTIDINE 20 MG PO TABS
20.0000 mg | ORAL_TABLET | Freq: Once | ORAL | Status: AC
  Administered 2013-01-17: 20 mg via ORAL
  Filled 2013-01-17: qty 1

## 2013-01-17 MED ORDER — FAMOTIDINE 20 MG PO TABS: 20.0000 mg | ORAL_TABLET | Freq: Two times a day (BID) | ORAL | Status: DC

## 2013-01-17 MED ORDER — SODIUM CHLORIDE 0.9 % IV BOLUS (SEPSIS)
1000.0000 mL | Freq: Once | INTRAVENOUS | Status: AC
  Administered 2013-01-17: 1000 mL via INTRAVENOUS

## 2013-01-17 MED ORDER — LORAZEPAM 1 MG PO TABS
1.0000 mg | ORAL_TABLET | Freq: Once | ORAL | Status: AC
  Administered 2013-01-17: 1 mg via ORAL
  Filled 2013-01-17: qty 1

## 2013-01-17 MED ORDER — ONDANSETRON HCL 4 MG/2ML IJ SOLN
4.0000 mg | Freq: Once | INTRAMUSCULAR | Status: AC
  Administered 2013-01-17: 4 mg via INTRAVENOUS
  Filled 2013-01-17: qty 2

## 2013-01-17 MED ORDER — PANTOPRAZOLE SODIUM 40 MG IV SOLR
40.0000 mg | Freq: Once | INTRAVENOUS | Status: AC
  Administered 2013-01-17: 40 mg via INTRAVENOUS
  Filled 2013-01-17: qty 40

## 2013-01-17 NOTE — ED Provider Notes (Signed)
History    CSN: [id number] Arrival date & time 01/16/13  2140  First MD Initiated Contact with Patient 01/17/13 0217     Chief Complaint  Patient presents with  . Chest Pain  . Emesis  . Abdominal Pain  . Headache   (Consider location/radiation/quality/duration/timing/severity/associated sxs/prior Treatment) HPI History provided by patient. Has a history of cirrhosis, bipolar and alcoholism. He presents tonight with epigastric pain and nausea vomiting after admittedly drinking too much alcohol. He has been drinking about 4 - 40 ounce beers per day. He did notice some blood in his emesis tonight after multiple episodes of vomiting. He has a history of pancreatitis. He denies any fever chills. No chest pain or trouble breathing. He is requesting something for anxiety. He denies any suicidal or homicidal ideation. His also been smoking crack with history of polysubstance abuse.  Past Medical History  Diagnosis Date  . Seizures   . Hypertension   . Depression   . Pancreatitis   . Liver cirrhosis   . Coronary artery disease   . Angina   . Shortness of breath   . Headache(784.0)   . Peripheral vascular disease April 2011    Left Pop  . Hypercholesteremia   . Schizophrenia   . Bipolar 1 disorder    Past Surgical History  Procedure Laterality Date  . Chest surgery    . Left leg surgery    . Mastectomy     Family History  Problem Relation Age of Onset  . Stroke Other    History  Substance Use Topics  . Smoking status: Current Every Day Smoker -- 0.10 packs/day for 4 years    Types: Cigarettes  . Smokeless tobacco: Never Used  . Alcohol Use: 1.8 oz/week    1 Glasses of wine, 2 Cans of beer per week     Comment: pint daily    Review of Systems  Constitutional: Negative for fever and chills.  HENT: Negative for neck pain and neck stiffness.   Eyes: Negative for pain.  Respiratory: Negative for shortness of breath.   Cardiovascular: Negative for chest pain.   Gastrointestinal: Positive for vomiting and abdominal pain. Negative for blood in stool.  Genitourinary: Negative for dysuria.  Musculoskeletal: Negative for back pain.  Skin: Negative for rash.  Neurological: Negative for headaches.  All other systems reviewed and are negative.    Allergies  Morphine and Penicillins  Home Medications   Current Outpatient Rx  Name  Route  Sig  Dispense  Refill  . amLODipine (NORVASC) 5 MG tablet   Oral   Take 5 mg by mouth daily.         . ARIPiprazole (ABILIFY) 2 MG tablet   Oral   Take 4 mg by mouth at bedtime.         . DULoxetine (CYMBALTA) 30 MG capsule   Oral   Take 30 mg by mouth daily.         . metoprolol tartrate (LOPRESSOR) 25 MG tablet   Oral   Take 25 mg by mouth 2 (two) times daily.         . simvastatin (ZOCOR) 40 MG tablet   Oral   Take 40 mg by mouth every evening.         . traZODone (DESYREL) 100 MG tablet   Oral   Take 150 mg by mouth at bedtime.           BP 134/94  Pulse 90  Temp(Src) 97.8  F (36.6 C) (Oral)  Resp 16  SpO2 99% Physical Exam  Constitutional: He is oriented to person, place, and time. He appears well-developed and well-nourished.  HENT:  Head: Normocephalic and atraumatic.  Eyes: EOM are normal. Pupils are equal, round, and reactive to light.  Neck: Neck supple.  Cardiovascular: Normal rate, regular rhythm and intact distal pulses.   Pulmonary/Chest: Effort normal and breath sounds normal. No respiratory distress.  Abdominal:  Abdomen is soft and nondistended with tenderness over the epigastrium. No significant right upper or left upper quadrant tenderness.  Musculoskeletal: Normal range of motion. He exhibits no edema.  Neurological: He is alert and oriented to person, place, and time.  Skin: Skin is warm and dry.    ED Course  Procedures (including critical care time) Labs Reviewed  CBC - Abnormal; Notable for the following:    WBC 3.2 (*)    MCHC 36.4 (*)     Platelets 149 (*)    All other components within normal limits  POCT I-STAT, CHEM 8 - Abnormal; Notable for the following:    Hemoglobin 17.3 (*)    All other components within normal limits  BASIC METABOLIC PANEL  LIPASE, BLOOD  POCT I-STAT TROPONIN I   Dg Chest Portable 1 View  01/17/2013   *RADIOLOGY REPORT*  Clinical Data: Mid to lower chest pain.  History of bilateral mastectomy.  PORTABLE CHEST - 1 VIEW  Comparison: 12/23/2012.  Findings: Surgical clips present over the chest.  Cardiopericardial silhouette within normal limits. Mediastinal contours normal. Trachea midline.  No airspace disease or effusion. Monitoring leads are projected over the chest.  IMPRESSION: No active cardiopulmonary disease.   Original Report Authenticated By: Andreas Newport, M.D.   IV fluids. Zofran. Protonix. Pepcid.  4:40 AM no emesis in ED. Tolerates oral fluids.  Plan discharge home with precautions for alcohol gastritis verbalizes understood. Prescription provided. Patient agrees to outpatient followup and return precautions.  MDM  Epigastric pain and emesis in the setting of alcohol/polysubstance abuse  Evaluated with labs and imaging reviewed as above.. chest x-ray does not show any free air under the diaphragm. No anemia. Lipase electrolytes within normal limits.  Condition improved with IV fluids and medications provided as above.  Vital signs and nursing notes reviewed and considered  Sunnie Nielsen, MD 01/17/13 [phone]

## 2013-01-17 NOTE — ED Notes (Signed)
Waiting on a bariatric bed before transferring pt to 2900.

## 2013-01-17 NOTE — ED Notes (Signed)
Pt reports wanting detox from etoh and crack. States that he drank 4 beers and did crack yesterday. States that he has been drinking and doing crack for the past month. Requested detox with the ativan protocol.

## 2013-01-22 IMAGING — CR DG NECK SOFT TISSUE
2 series · 2 of 2 positions shown · non-contrast
Comparison: None

CLINICAL DATA: Throat pain and swelling

NECK SOFT TISSUES - 1+ VIEW

[w soft tissue neck * (1 of 2)]
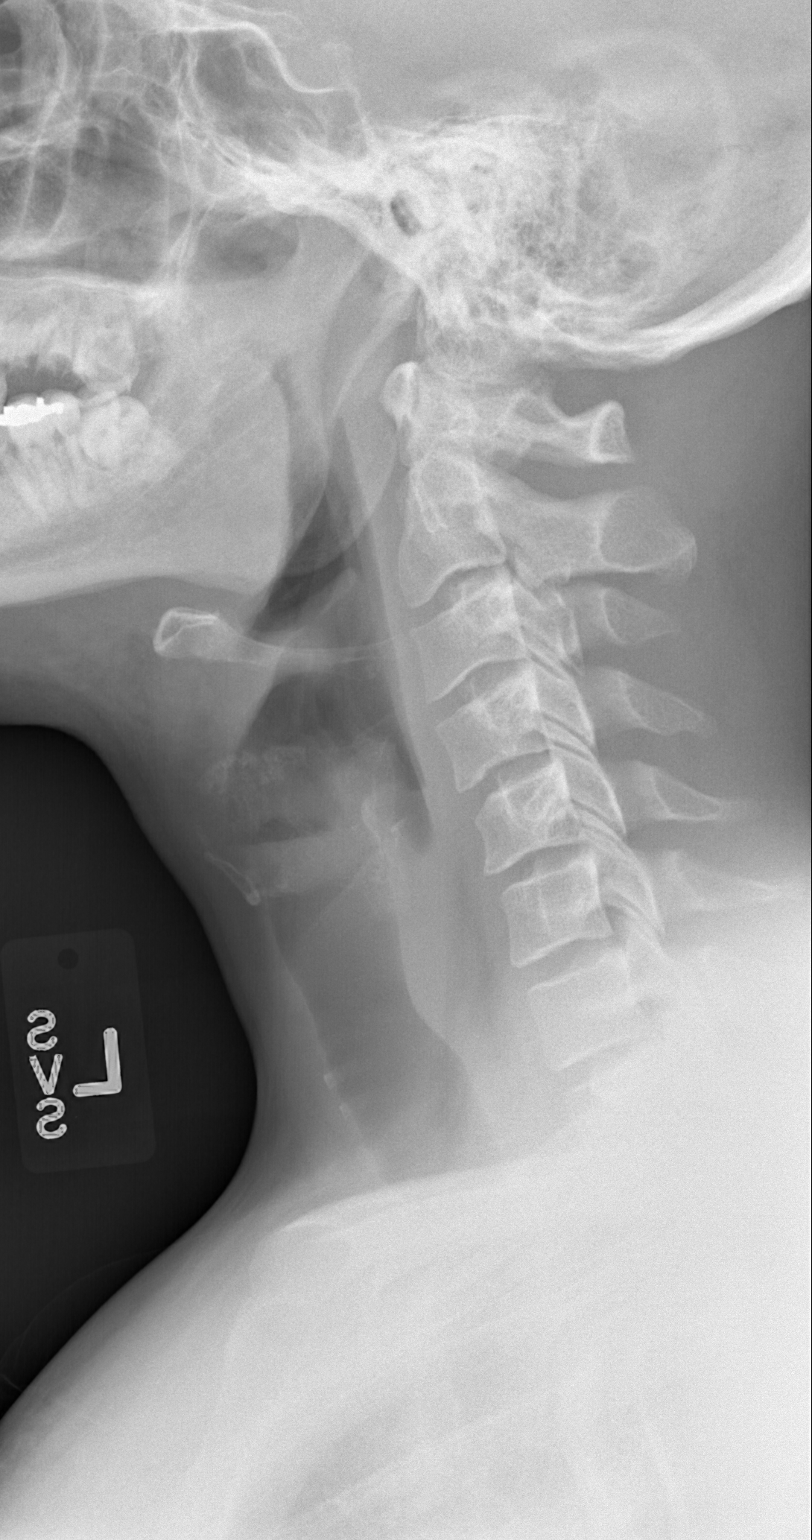

[w soft tissue neck * (2 of 2)]
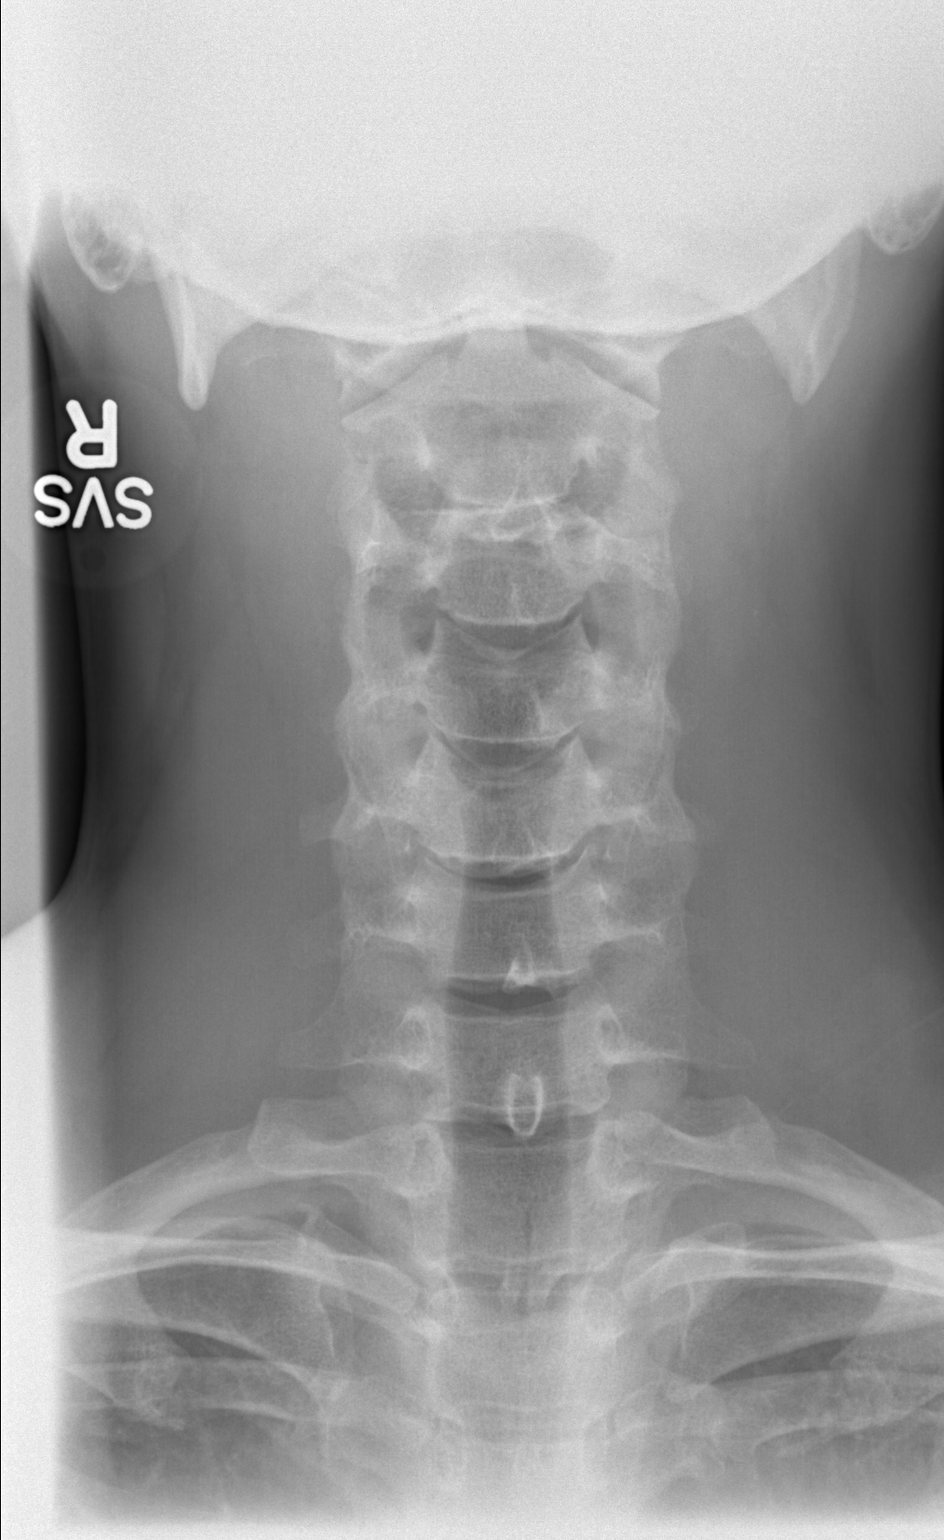

[2 of 2 positions shown; findings below may reference images not displayed]

FINDINGS: Upper normal prevertebral soft tissues thickness at C6.
Prevertebral soft tissues otherwise normal in thickness.
Normal appearance of epiglottis and aryepiglottic folds.
No definite abnormal soft tissue swelling or gas.
Airway patent.
Visualized osseous structures unremarkable on soft tissue technique
exam.
Question mild adenoid enlargement.
IMPRESSION: Question mild adenoid enlargement.
No definite acute soft tissue abnormalities.

## 2013-01-22 IMAGING — CR DG CHEST 2V
2 series · 2 of 2 positions shown · non-contrast
Comparison: 10/31/2009

CLINICAL DATA: Pain in throat/cough

CHEST - 2 VIEW

[w chest pa]
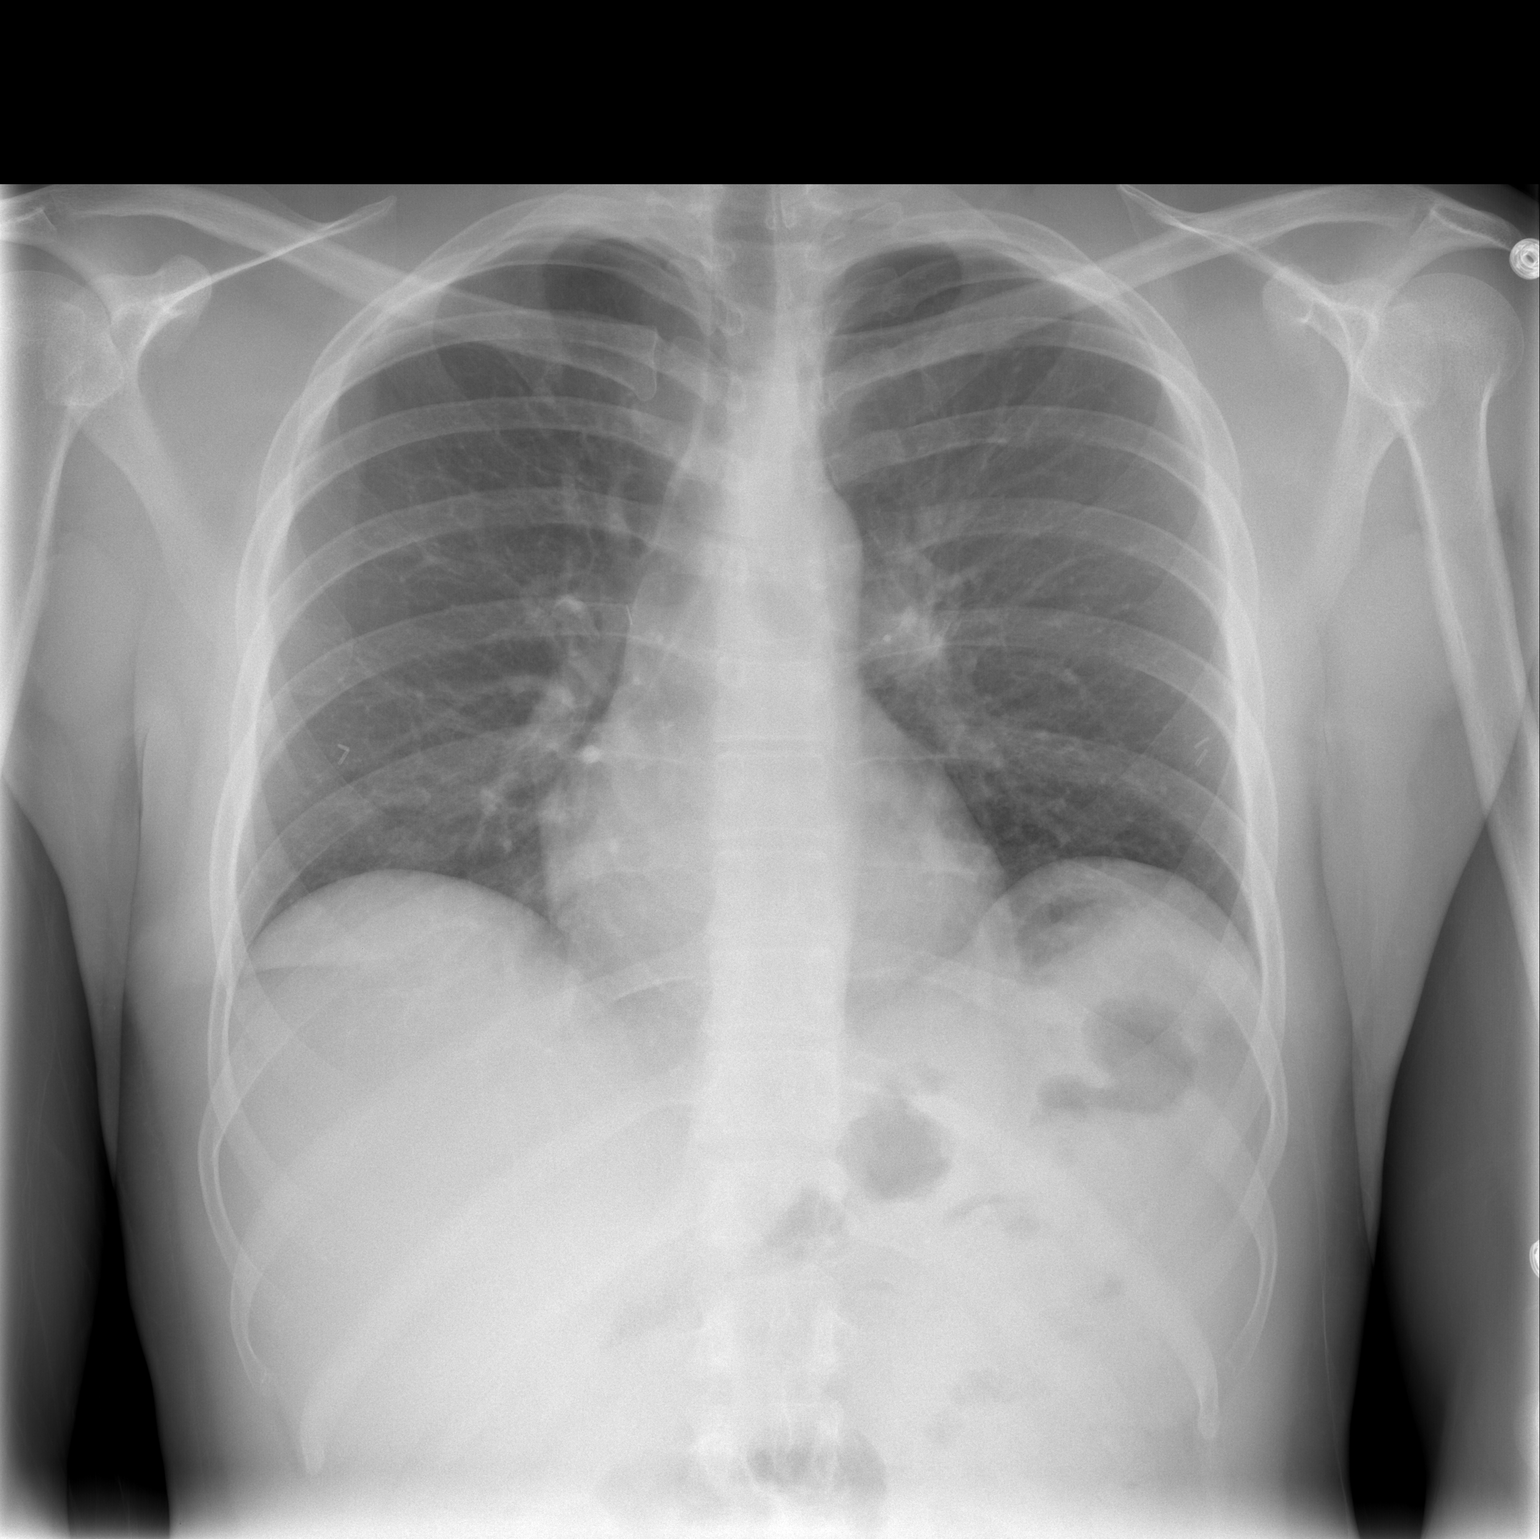

[w chest lat]
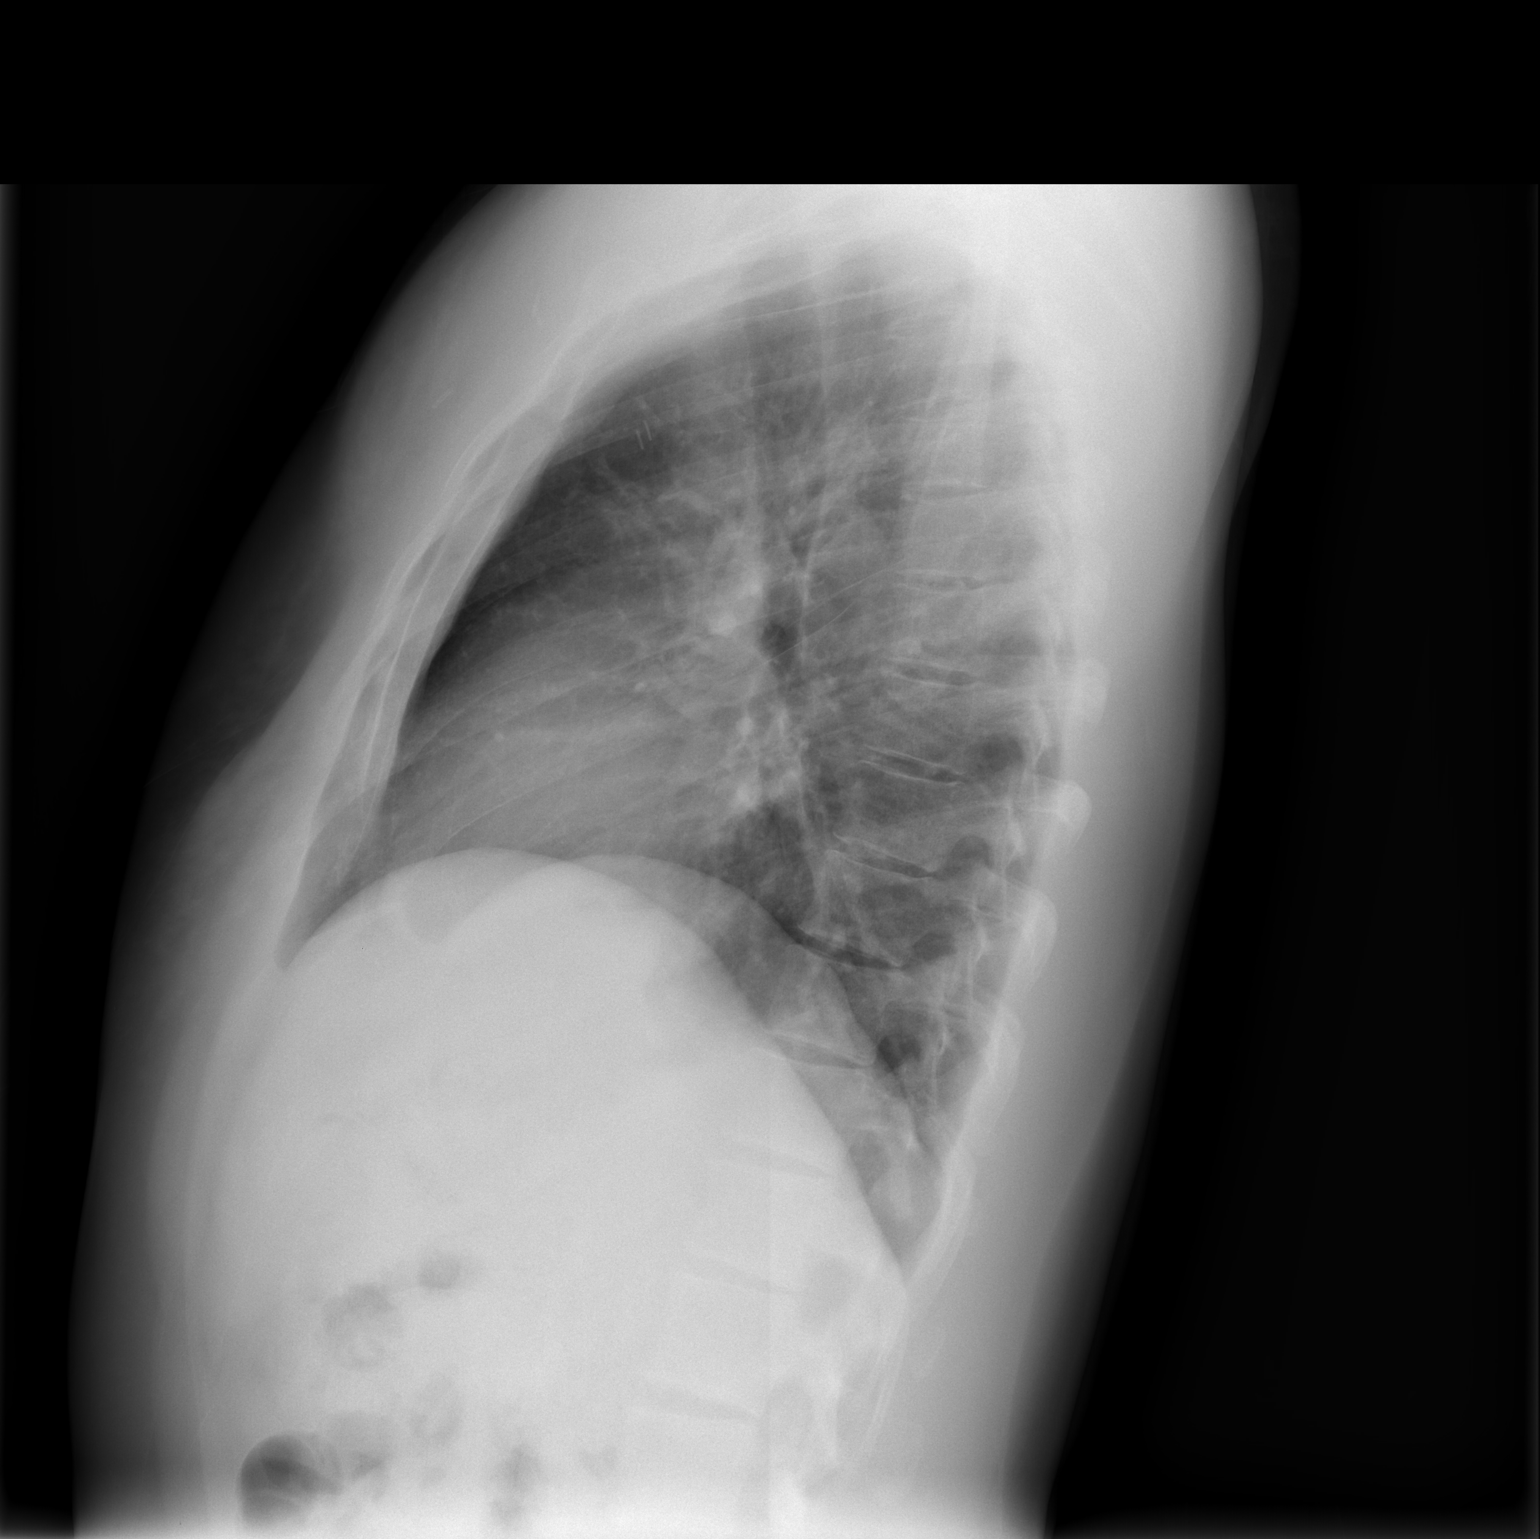

[2 of 2 positions shown; findings below may reference images not displayed]

FINDINGS: Heart and mediastinal contours normal.  Lungs clear.  No
pleural fluid.  Osseous structures and soft tissues unremarkable.
IMPRESSION: No acute or significant findings.

## 2013-02-22 DIAGNOSIS — L2989 Other pruritus: Secondary | ICD-10-CM | POA: Insufficient documentation

## 2013-02-22 DIAGNOSIS — L298 Other pruritus: Secondary | ICD-10-CM | POA: Insufficient documentation

## 2013-02-22 DIAGNOSIS — C50929 Malignant neoplasm of unspecified site of unspecified male breast: Secondary | ICD-10-CM | POA: Insufficient documentation

## 2013-03-15 ENCOUNTER — Emergency Department (HOSPITAL_COMMUNITY): Payer: Self-pay

## 2013-03-15 ENCOUNTER — Emergency Department (HOSPITAL_COMMUNITY)
Admission: EM | Admit: 2013-03-15 | Discharge: 2013-03-15 | Disposition: A | Discharge status: Home / Self Care | Payer: Self-pay | Attending: Emergency Medicine | Admitting: Emergency Medicine

## 2013-03-15 ENCOUNTER — Encounter (HOSPITAL_COMMUNITY): Payer: Self-pay

## 2013-03-15 DIAGNOSIS — R059 Cough, unspecified: Secondary | ICD-10-CM | POA: Insufficient documentation

## 2013-03-15 DIAGNOSIS — I2510 Atherosclerotic heart disease of native coronary artery without angina pectoris: Secondary | ICD-10-CM | POA: Insufficient documentation

## 2013-03-15 DIAGNOSIS — I10 Essential (primary) hypertension: Secondary | ICD-10-CM | POA: Insufficient documentation

## 2013-03-15 DIAGNOSIS — Z8719 Personal history of other diseases of the digestive system: Secondary | ICD-10-CM | POA: Insufficient documentation

## 2013-03-15 DIAGNOSIS — R1110 Vomiting, unspecified: Secondary | ICD-10-CM

## 2013-03-15 DIAGNOSIS — Z880 Allergy status to penicillin: Secondary | ICD-10-CM | POA: Insufficient documentation

## 2013-03-15 DIAGNOSIS — F17200 Nicotine dependence, unspecified, uncomplicated: Secondary | ICD-10-CM | POA: Insufficient documentation

## 2013-03-15 DIAGNOSIS — R112 Nausea with vomiting, unspecified: Secondary | ICD-10-CM | POA: Insufficient documentation

## 2013-03-15 DIAGNOSIS — Z79899 Other long term (current) drug therapy: Secondary | ICD-10-CM | POA: Insufficient documentation

## 2013-03-15 DIAGNOSIS — R569 Unspecified convulsions: Secondary | ICD-10-CM

## 2013-03-15 DIAGNOSIS — R05 Cough: Secondary | ICD-10-CM | POA: Insufficient documentation

## 2013-03-15 DIAGNOSIS — Z8679 Personal history of other diseases of the circulatory system: Secondary | ICD-10-CM | POA: Insufficient documentation

## 2013-03-15 DIAGNOSIS — F209 Schizophrenia, unspecified: Secondary | ICD-10-CM | POA: Insufficient documentation

## 2013-03-15 DIAGNOSIS — R4182 Altered mental status, unspecified: Secondary | ICD-10-CM | POA: Insufficient documentation

## 2013-03-15 DIAGNOSIS — R197 Diarrhea, unspecified: Secondary | ICD-10-CM | POA: Insufficient documentation

## 2013-03-15 DIAGNOSIS — E7800 Pure hypercholesterolemia, unspecified: Secondary | ICD-10-CM | POA: Insufficient documentation

## 2013-03-15 DIAGNOSIS — R21 Rash and other nonspecific skin eruption: Secondary | ICD-10-CM | POA: Insufficient documentation

## 2013-03-15 DIAGNOSIS — F319 Bipolar disorder, unspecified: Secondary | ICD-10-CM | POA: Insufficient documentation

## 2013-03-15 DIAGNOSIS — G40909 Epilepsy, unspecified, not intractable, without status epilepticus: Secondary | ICD-10-CM | POA: Diagnosis present

## 2013-03-15 DIAGNOSIS — R51 Headache: Secondary | ICD-10-CM | POA: Insufficient documentation

## 2013-03-15 DIAGNOSIS — Z85528 Personal history of other malignant neoplasm of kidney: Secondary | ICD-10-CM | POA: Insufficient documentation

## 2013-03-15 LAB — COMPREHENSIVE METABOLIC PANEL
AST: 58 U/L — ABNORMAL HIGH (ref 0–37)
Albumin: 3.7 g/dL (ref 3.5–5.2)
Calcium: 9.3 mg/dL (ref 8.4–10.5)
Chloride: 97 mEq/L (ref 96–112)
Creatinine, Ser: 0.82 mg/dL (ref 0.50–1.35)
Total Protein: 7.5 g/dL (ref 6.0–8.3)

## 2013-03-15 LAB — CBC WITH DIFFERENTIAL/PLATELET
Basophils Absolute: 0 10*3/uL (ref 0.0–0.1)
Basophils Relative: 0 % (ref 0–1)
Eosinophils Absolute: 0 10*3/uL (ref 0.0–0.7)
Eosinophils Relative: 0 % (ref 0–5)
HCT: 45.4 % (ref 39.0–52.0)
MCHC: 35.7 g/dL (ref 30.0–36.0)
Monocytes Absolute: 0.5 10*3/uL (ref 0.1–1.0)
Neutro Abs: 3.6 10*3/uL (ref 1.7–7.7)
RDW: 14.3 % (ref 11.5–15.5)

## 2013-03-15 MED ORDER — HYDROMORPHONE HCL PF 1 MG/ML IJ SOLN
0.5000 mg | Freq: Once | INTRAMUSCULAR | Status: AC
  Administered 2013-03-15: 0.5 mg via INTRAVENOUS
  Filled 2013-03-15: qty 1

## 2013-03-15 MED ORDER — PROCHLORPERAZINE EDISYLATE 5 MG/ML IJ SOLN
5.0000 mg | Freq: Four times a day (QID) | INTRAMUSCULAR | Status: DC | PRN
  Administered 2013-03-15: 5 mg via INTRAVENOUS
  Filled 2013-03-15: qty 2

## 2013-03-15 MED ORDER — ONDANSETRON 4 MG PO TBDP: ORAL_TABLET | ORAL | Status: DC

## 2013-03-15 MED ORDER — DIPHENHYDRAMINE HCL 50 MG/ML IJ SOLN
25.0000 mg | Freq: Once | INTRAMUSCULAR | Status: AC
  Administered 2013-03-15: 25 mg via INTRAVENOUS
  Filled 2013-03-15: qty 1

## 2013-03-15 MED ORDER — ONDANSETRON HCL 4 MG/2ML IJ SOLN
4.0000 mg | Freq: Once | INTRAMUSCULAR | Status: AC
  Administered 2013-03-15: 4 mg via INTRAVENOUS
  Filled 2013-03-15: qty 2

## 2013-03-15 MED ORDER — SODIUM CHLORIDE 0.9 % IV BOLUS (SEPSIS)
1000.0000 mL | INTRAVENOUS | Status: AC
  Administered 2013-03-15: 1000 mL via INTRAVENOUS

## 2013-03-15 NOTE — ED Notes (Signed)
Bed: ZO10 Expected date:  Expected time:  Means of arrival:  Comments: ems- seizure 9 hours ago, n/v

## 2013-03-15 NOTE — Progress Notes (Signed)
[facility] provided patient with a list of primary care resources in [place]. Patient stated that he was pending Medicaid.

## 2013-03-15 NOTE — ED Provider Notes (Signed)
CSN: [id number]     Arrival date & time 03/15/13  4098 History   First MD Initiated Contact with Patient 03/15/13 1002     Chief Complaint  Patient presents with  . Weakness  . Cough  . Nausea   HPI:  The patient is a 32 year old male who presents with multiple complaints. He states that he had a seizure last night while in the bathroom. He states that he fell from standing. He does not know if he hit his head. The seizure was not witnessed and he is unsure how long it lasted. He says since then he has had vomiting and mild diarrhea. He also notes a headache on exam, 8/10 in pain and throbbing in nature. The duration of his symptoms is constant. Severity is noted to be mild. He has had similar symptoms in the past. He also notes a mild cough. He denies any other associated symptoms. The patient does have a history of seizures but states that he hasn't had any seizure in 6 months since he quit drinking alcohol. Nothing has relieved his symptoms thus far.  (Consider location/radiation/quality/duration/timing/severity/associated sxs/prior Treatment) Patient is a 32 y.o. male presenting with cough. The history is provided by the patient.  Cough Associated symptoms: headaches and rash (desquamating rash on LE's and palms)   Associated symptoms: no chest pain, no fever, no rhinorrhea and no shortness of breath     Past Medical History  Diagnosis Date  . Seizures   . Hypertension   . Depression   . Pancreatitis   . Liver cirrhosis   . Coronary artery disease   . Angina   . Shortness of breath   . Headache(784.0)   . Peripheral vascular disease April 2011    Left Pop  . Hypercholesteremia   . Schizophrenia   . Bipolar 1 disorder   . Cancer of kidney    Past Surgical History  Procedure Laterality Date  . Chest surgery    . Left leg surgery    . Mastectomy     Family History  Problem Relation Age of Onset  . Stroke Other    History  Substance Use Topics  . Smoking status:  Current Every Day Smoker -- 0.10 packs/day for 4 years    Types: Cigarettes  . Smokeless tobacco: Never Used  . Alcohol Use: No     Comment: no alcohol x 1 month    Review of Systems  Constitutional: Negative for fever.  HENT: Negative for rhinorrhea, drooling and neck pain.   Eyes: Negative for pain.  Respiratory: Positive for cough. Negative for shortness of breath.   Cardiovascular: Negative for chest pain and leg swelling.  Gastrointestinal: Positive for nausea, vomiting and diarrhea. Negative for abdominal pain.  Genitourinary: Negative for dysuria and hematuria.  Musculoskeletal: Negative for gait problem.  Skin: Positive for rash (desquamating rash on LE's and palms). Negative for color change.  Neurological: Positive for headaches. Negative for numbness.  Hematological: Negative for adenopathy.  Psychiatric/Behavioral: Negative for behavioral problems.  All other systems reviewed and are negative.    Allergies  Morphine and Penicillins  Home Medications   Current Outpatient Rx  Name  Route  Sig  Dispense  Refill  . amLODipine (NORVASC) 5 MG tablet   Oral   Take 5 mg by mouth daily.         . ARIPiprazole (ABILIFY) 2 MG tablet   Oral   Take 4 mg by mouth at bedtime.         Marland Kitchen  DULoxetine (CYMBALTA) 30 MG capsule   Oral   Take 30 mg by mouth daily.         . famotidine (PEPCID) 20 MG tablet   Oral   Take 1 tablet (20 mg total) by mouth 2 (two) times daily.   30 tablet   0   . metoprolol tartrate (LOPRESSOR) 25 MG tablet   Oral   Take 25 mg by mouth 2 (two) times daily.         . simvastatin (ZOCOR) 40 MG tablet   Oral   Take 40 mg by mouth every evening.         . traZODone (DESYREL) 100 MG tablet   Oral   Take 150 mg by mouth at bedtime.           There were no vitals taken for this visit. Physical Exam  Nursing note and vitals reviewed. Constitutional: He is oriented to person, place, and time. He appears well-developed and  well-nourished.  HENT:  Head: Normocephalic and atraumatic.  Right Ear: External ear normal.  Left Ear: External ear normal.  Nose: Nose normal.  Mouth/Throat: Oropharynx is clear and moist. No oropharyngeal exudate.  Eyes: Conjunctivae and EOM are normal. Pupils are equal, round, and reactive to light.  Neck: Normal range of motion. Neck supple.  Cardiovascular: Normal rate, regular rhythm, normal heart sounds and intact distal pulses.  Exam reveals no gallop and no friction rub.   No murmur heard. Pulmonary/Chest: Effort normal and breath sounds normal. No respiratory distress. He has no wheezes.  Abdominal: Soft. Bowel sounds are normal. He exhibits no distension. There is no tenderness. There is no rebound and no guarding.  Musculoskeletal: Normal range of motion. He exhibits no edema and no tenderness.  Neurological: He is alert and oriented to person, place, and time.  Skin: Skin is warm and dry. Rash (desquamation of skin on LE's and palms) noted.  Psychiatric: He has a normal mood and affect. His behavior is normal.    ED Course  Procedures (including critical care time) Labs Review Labs Reviewed  COMPREHENSIVE METABOLIC PANEL - Abnormal; Notable for the following:    Sodium 134 (*)    Glucose, Bld 118 (*)    AST 58 (*)    All other components within normal limits  CBC WITH DIFFERENTIAL   Imaging Review Dg Chest 2 View  03/15/2013   *RADIOLOGY REPORT*  Clinical Data: Cough for 1 week.  CHEST - 2 VIEW  Comparison: Single view of the chest 01/17/2013.  Findings: Lungs are clear.  Heart size is normal.  No pneumothorax or pleural fluid.  IMPRESSION: Negative chest.   Original Report Authenticated By: Holley Dexter, M.D.   Ct Head Wo Contrast  03/15/2013   *RADIOLOGY REPORT*  Clinical Data: Seizure.  CT HEAD WITHOUT CONTRAST  Technique:  Contiguous axial images were obtained from the base of the skull through the vertex without contrast.  Comparison: Head CT scan 11/30/2012.   Findings: There is no evidence of acute intra abnormality including infarct, hemorrhage, mass lesion, mass effect, midline shift or abnormal extra-axial fluid collection.  There is no hydrocephalus or pneumocephalus.  The calvarium is intact.  IMPRESSION: Negative head CT.   Original Report Authenticated By: Holley Dexter, M.D.    MDM   1. Vomiting   2. Seizure     10:14 AM 32 y.o. male w hx of etoh abuse, seizures, suspected renal cancer who pw seizure last night. Pt notes he fell from  standing, mild HA now. Has had vomiting since. Pt AFVSS here. Appears well on exam. Will get labs, IVF, CT head.    Pt feeling much better. I interpreted/reviewed the labs and/or imaging which were non-contributory.  I have discussed the diagnosis/risks/treatment options with the patient and believe the pt to be eligible for discharge home to follow-up with pcp as needed. We also discussed returning to the ED immediately if new or worsening sx occur. We discussed the sx which are most concerning (e.g., worsening vomiting, diarrhea, fever) that necessitate immediate return. Any new prescriptions provided to the patient are listed below.  Discharge Medication List as of 03/15/2013  1:52 PM    START taking these medications   Details  ondansetron (ZOFRAN ODT) 4 MG disintegrating tablet 4mg  ODT q4 hours prn nausea/vomit, Print         Junius Argyle, MD 03/15/13 1607

## 2013-03-15 NOTE — ED Notes (Signed)
Called into pt's room, pt sts "that last medication you gave me is doing something to me". Pt is diaphoretic, shaky; dr Romeo Apple at bedside. Order received for benadryl.

## 2013-03-15 NOTE — ED Notes (Addendum)
Per EMS- Patient reports that he had a seizure 9 hours ago and is now having weakness, nausea/vomiting, and cough. Patient also c/o rash on right hand, and left leg and states the rash is burning.

## 2013-03-15 NOTE — ED Notes (Signed)
Pt given soft drink per his request, sts he feels much better now.

## 2013-03-27 ENCOUNTER — Encounter (HOSPITAL_COMMUNITY): Payer: Self-pay | Admitting: Emergency Medicine

## 2013-03-27 ENCOUNTER — Emergency Department (HOSPITAL_COMMUNITY)
Admission: EM | Admit: 2013-03-27 | Discharge: 2013-03-28 | Disposition: A | Discharge status: Other Acute Inpatient Hospital | Payer: No Typology Code available for payment source | Attending: Emergency Medicine | Admitting: Emergency Medicine

## 2013-03-27 DIAGNOSIS — R1110 Vomiting, unspecified: Secondary | ICD-10-CM | POA: Insufficient documentation

## 2013-03-27 DIAGNOSIS — F4310 Post-traumatic stress disorder, unspecified: Secondary | ICD-10-CM

## 2013-03-27 DIAGNOSIS — I10 Essential (primary) hypertension: Secondary | ICD-10-CM | POA: Insufficient documentation

## 2013-03-27 DIAGNOSIS — Z85528 Personal history of other malignant neoplasm of kidney: Secondary | ICD-10-CM | POA: Insufficient documentation

## 2013-03-27 DIAGNOSIS — F329 Major depressive disorder, single episode, unspecified: Secondary | ICD-10-CM | POA: Insufficient documentation

## 2013-03-27 DIAGNOSIS — F1020 Alcohol dependence, uncomplicated: Secondary | ICD-10-CM

## 2013-03-27 DIAGNOSIS — I209 Angina pectoris, unspecified: Secondary | ICD-10-CM | POA: Insufficient documentation

## 2013-03-27 DIAGNOSIS — E7800 Pure hypercholesterolemia, unspecified: Secondary | ICD-10-CM | POA: Insufficient documentation

## 2013-03-27 DIAGNOSIS — F1010 Alcohol abuse, uncomplicated: Secondary | ICD-10-CM | POA: Insufficient documentation

## 2013-03-27 DIAGNOSIS — Z79899 Other long term (current) drug therapy: Secondary | ICD-10-CM | POA: Insufficient documentation

## 2013-03-27 DIAGNOSIS — Z8669 Personal history of other diseases of the nervous system and sense organs: Secondary | ICD-10-CM | POA: Insufficient documentation

## 2013-03-27 DIAGNOSIS — F3289 Other specified depressive episodes: Secondary | ICD-10-CM | POA: Insufficient documentation

## 2013-03-27 DIAGNOSIS — Z8719 Personal history of other diseases of the digestive system: Secondary | ICD-10-CM | POA: Insufficient documentation

## 2013-03-27 DIAGNOSIS — F17200 Nicotine dependence, unspecified, uncomplicated: Secondary | ICD-10-CM | POA: Insufficient documentation

## 2013-03-27 DIAGNOSIS — Z880 Allergy status to penicillin: Secondary | ICD-10-CM | POA: Insufficient documentation

## 2013-03-27 DIAGNOSIS — F339 Major depressive disorder, recurrent, unspecified: Secondary | ICD-10-CM

## 2013-03-27 DIAGNOSIS — Z9889 Other specified postprocedural states: Secondary | ICD-10-CM | POA: Insufficient documentation

## 2013-03-27 DIAGNOSIS — Z8709 Personal history of other diseases of the respiratory system: Secondary | ICD-10-CM | POA: Insufficient documentation

## 2013-03-27 DIAGNOSIS — F411 Generalized anxiety disorder: Secondary | ICD-10-CM | POA: Insufficient documentation

## 2013-03-27 DIAGNOSIS — F209 Schizophrenia, unspecified: Secondary | ICD-10-CM | POA: Insufficient documentation

## 2013-03-27 DIAGNOSIS — F319 Bipolar disorder, unspecified: Secondary | ICD-10-CM | POA: Insufficient documentation

## 2013-03-27 DIAGNOSIS — F419 Anxiety disorder, unspecified: Secondary | ICD-10-CM

## 2013-03-27 DIAGNOSIS — I2510 Atherosclerotic heart disease of native coronary artery without angina pectoris: Secondary | ICD-10-CM | POA: Insufficient documentation

## 2013-03-27 LAB — COMPREHENSIVE METABOLIC PANEL
ALT: 50 U/L (ref 0–53)
AST: 50 U/L — ABNORMAL HIGH (ref 0–37)
Albumin: 3.7 g/dL (ref 3.5–5.2)
Alkaline Phosphatase: 61 U/L (ref 39–117)
BUN: 7 mg/dL (ref 6–23)
CO2: 25 mEq/L (ref 19–32)
Calcium: 9.5 mg/dL (ref 8.4–10.5)
Chloride: 102 mEq/L (ref 96–112)
Creatinine, Ser: 0.76 mg/dL (ref 0.50–1.35)
GFR calc Af Amer: >90 mL/min (ref >90)
GFR calc non Af Amer: >90 mL/min (ref >90)
Glucose, Bld: 101 mg/dL — ABNORMAL HIGH (ref 70–99)
Potassium: 4.1 mEq/L (ref 3.5–5.1)
Sodium: 137 mEq/L (ref 135–145)
Total Bilirubin: 0.3 mg/dL (ref 0.3–1.2)
Total Protein: 7.5 g/dL (ref 6.0–8.3)

## 2013-03-27 LAB — RAPID URINE DRUG SCREEN, HOSP PERFORMED
Amphetamines: NOT DETECTED
Barbiturates: NOT DETECTED
Benzodiazepines: NOT DETECTED
Cocaine: POSITIVE — AB
Opiates: NOT DETECTED
Tetrahydrocannabinol: NOT DETECTED

## 2013-03-27 LAB — CBC
HCT: 44.1 % (ref 39.0–52.0)
Hemoglobin: 15.7 g/dL (ref 13.0–17.0)
MCH: 31 pg (ref 26.0–34.0)
MCHC: 35.6 g/dL (ref 30.0–36.0)
MCV: 87 fL (ref 78.0–100.0)
Platelets: 196 10*3/uL (ref 150–400)
RBC: 5.07 MIL/uL (ref 4.22–5.81)
RDW: 15 % (ref 11.5–15.5)
WBC: 4.3 10*3/uL (ref 4.0–10.5)

## 2013-03-27 LAB — ETHANOL: Alcohol, Ethyl (B): <11 mg/dL (ref 0–11)

## 2013-03-27 LAB — ACETAMINOPHEN LEVEL: Acetaminophen (Tylenol), Serum: <15 ug/mL (ref 10–30)

## 2013-03-27 LAB — SALICYLATE LEVEL: Salicylate Lvl: <2 mg/dL — ABNORMAL LOW (ref 2.8–20.0)

## 2013-03-27 MED ORDER — ARIPIPRAZOLE 2 MG PO TABS
4.0000 mg | ORAL_TABLET | Freq: Every day | ORAL | Status: DC
  Administered 2013-03-27: 4 mg via ORAL
  Filled 2013-03-27 (×2): qty 2

## 2013-03-27 MED ORDER — ONDANSETRON HCL 4 MG PO TABS: 4.0000 mg | ORAL_TABLET | Freq: Three times a day (TID) | ORAL | Status: DC | PRN

## 2013-03-27 MED ORDER — ALUM & MAG HYDROXIDE-SIMETH 200-200-20 MG/5ML PO SUSP: 30.0000 mL | ORAL | Status: DC | PRN

## 2013-03-27 MED ORDER — VITAMIN B-1 100 MG PO TABS
100.0000 mg | ORAL_TABLET | Freq: Every day | ORAL | Status: DC
  Administered 2013-03-27 – 2013-03-28 (×2): 100 mg via ORAL
  Filled 2013-03-27 (×2): qty 1

## 2013-03-27 MED ORDER — ZOLPIDEM TARTRATE 5 MG PO TABS: 5.0000 mg | ORAL_TABLET | Freq: Every evening | ORAL | Status: DC | PRN

## 2013-03-27 MED ORDER — METOPROLOL TARTRATE 25 MG PO TABS
25.0000 mg | ORAL_TABLET | Freq: Two times a day (BID) | ORAL | Status: DC
  Administered 2013-03-27 – 2013-03-28 (×3): 25 mg via ORAL
  Filled 2013-03-27 (×3): qty 1

## 2013-03-27 MED ORDER — ATORVASTATIN CALCIUM 20 MG PO TABS
20.0000 mg | ORAL_TABLET | Freq: Every day | ORAL | Status: DC
  Administered 2013-03-27: 20 mg via ORAL
  Filled 2013-03-27 (×4): qty 1

## 2013-03-27 MED ORDER — LORAZEPAM 1 MG PO TABS: 0.0000 mg | ORAL_TABLET | Freq: Two times a day (BID) | ORAL | Status: DC

## 2013-03-27 MED ORDER — DIPHENHYDRAMINE HCL 25 MG PO TABS
25.0000 mg | ORAL_TABLET | Freq: Four times a day (QID) | ORAL | Status: DC | PRN
  Filled 2013-03-27: qty 1

## 2013-03-27 MED ORDER — SIMVASTATIN 40 MG PO TABS: 40.0000 mg | ORAL_TABLET | Freq: Every evening | ORAL | Status: DC

## 2013-03-27 MED ORDER — ACETAMINOPHEN 325 MG PO TABS
650.0000 mg | ORAL_TABLET | ORAL | Status: DC | PRN
  Administered 2013-03-27: 650 mg via ORAL
  Filled 2013-03-27: qty 2

## 2013-03-27 MED ORDER — IBUPROFEN 200 MG PO TABS: 600.0000 mg | ORAL_TABLET | Freq: Three times a day (TID) | ORAL | Status: DC | PRN

## 2013-03-27 MED ORDER — TRAZODONE HCL 50 MG PO TABS
150.0000 mg | ORAL_TABLET | Freq: Every day | ORAL | Status: DC
  Administered 2013-03-27: 150 mg via ORAL
  Filled 2013-03-27 (×2): qty 1

## 2013-03-27 MED ORDER — LORAZEPAM 1 MG PO TABS
0.0000 mg | ORAL_TABLET | Freq: Four times a day (QID) | ORAL | Status: DC
  Administered 2013-03-27 – 2013-03-28 (×3): 1 mg via ORAL
  Filled 2013-03-27 (×3): qty 1

## 2013-03-27 MED ORDER — DULOXETINE HCL 30 MG PO CPEP
30.0000 mg | ORAL_CAPSULE | Freq: Every day | ORAL | Status: DC
  Administered 2013-03-27 – 2013-03-28 (×2): 30 mg via ORAL
  Filled 2013-03-27 (×3): qty 1

## 2013-03-27 MED ORDER — THIAMINE HCL 100 MG/ML IJ SOLN: 100.0000 mg | Freq: Every day | INTRAMUSCULAR | Status: DC

## 2013-03-27 MED ORDER — AMLODIPINE BESYLATE 5 MG PO TABS
5.0000 mg | ORAL_TABLET | Freq: Every day | ORAL | Status: DC
  Administered 2013-03-27 – 2013-03-28 (×2): 5 mg via ORAL
  Filled 2013-03-27 (×3): qty 1

## 2013-03-27 NOTE — ED Provider Notes (Signed)
CSN: [id number]     Arrival date & time 03/27/13  1145 History   First MD Initiated Contact with Patient 03/27/13 1320     Chief Complaint  Patient presents with  . Medical Clearance  . Emesis   (Consider location/radiation/quality/duration/timing/severity/associated sxs/prior Treatment) HPI  Patient presents to the ED requesting detox from alcohol. He has a PMH positive for seizures, hypertension, depression, pancreatitis, liver cirrhosis, coronary artery disease, short of breath, peripheral vascular disease, schizophrenia, bipolar disorder, possible cancer of the kidney.  He says that he is trying to detox from alcohol and that his last drink was this morning. He endorses having cancer but denies knowing what kind. He says he is supposed to have surgery sometime soon to remove a kidney. He endorses vomiting for two days and having had a seizure this morning. His last drink was this morning. His vital signs are stable. Pt is up and walking around triage and appears to be in no acute distress and has not vomiting in triage.   Past Medical History  Diagnosis Date  . Seizures   . Hypertension   . Depression   . Pancreatitis   . Liver cirrhosis   . Coronary artery disease   . Angina   . Shortness of breath   . Headache(784.0)   . Peripheral vascular disease April 2011    Left Pop  . Hypercholesteremia   . Schizophrenia   . Bipolar 1 disorder   . Cancer of kidney    Past Surgical History  Procedure Laterality Date  . Chest surgery    . Left leg surgery    . Mastectomy     Family History  Problem Relation Age of Onset  . Stroke Other    History  Substance Use Topics  . Smoking status: Current Every Day Smoker -- 0.10 packs/day for 4 years    Types: Cigarettes  . Smokeless tobacco: Never Used  . Alcohol Use: No     Comment: no alcohol x 1 month    Review of Systems ROS is negative unless otherwise stated in the HPI  Allergies  Depakote; Morphine; and Penicillins  Home  Medications   Current Outpatient Rx  Name  Route  Sig  Dispense  Refill  . amLODipine (NORVASC) 5 MG tablet   Oral   Take 5 mg by mouth daily.         . ARIPiprazole (ABILIFY) 2 MG tablet   Oral   Take 4 mg by mouth at bedtime.         . diphenhydrAMINE (BENADRYL) 25 MG tablet   Oral   Take 25 mg by mouth every 6 (six) hours as needed for itching.         . DULoxetine (CYMBALTA) 30 MG capsule   Oral   Take 30 mg by mouth daily.         . metoprolol tartrate (LOPRESSOR) 25 MG tablet   Oral   Take 25 mg by mouth 2 (two) times daily.         . simvastatin (ZOCOR) 40 MG tablet   Oral   Take 40 mg by mouth every evening.         . traZODone (DESYREL) 100 MG tablet   Oral   Take 150 mg by mouth at bedtime.           BP 133/98  Pulse 86  Temp(Src) 98.7 F (37.1 C)  Resp 16  SpO2 97% Physical Exam  Nursing note and  vitals reviewed. Constitutional: He appears well-developed and well-nourished. No distress.  HENT:  Head: Normocephalic and atraumatic.  Eyes: Pupils are equal, round, and reactive to light.  Neck: Normal range of motion. Neck supple.  Cardiovascular: Normal rate and regular rhythm.   Pulmonary/Chest: Effort normal.  Abdominal: Soft.  Neurological: He is alert.  Skin: Skin is warm and dry.  Psychiatric: His mood appears anxious. He exhibits a depressed mood. He expresses no homicidal and no suicidal ideation. He expresses no suicidal plans and no homicidal plans.    ED Course  Procedures (including critical care time) Labs Review Labs Reviewed  COMPREHENSIVE METABOLIC PANEL - Abnormal; Notable for the following:    Glucose, Bld 101 (*)    AST 50 (*)    All other components within normal limits  SALICYLATE LEVEL - Abnormal; Notable for the following:    Salicylate Lvl <2.0 (*)    All other components within normal limits  URINE RAPID DRUG SCREEN (HOSP PERFORMED) - Abnormal; Notable for the following:    Cocaine POSITIVE (*)    All  other components within normal limits  ACETAMINOPHEN LEVEL  CBC  ETHANOL   Imaging Review No results found.  MDM   1. ETOH abuse    Alcohol withdrawal protocol ordered. Holding orders placed. Labs have resulted, pt positive for coraine. His WBC, electrolytes and kidney function are WNL. Pt medically cleared at this time.  TTS consulted.    Dorthula Matas, PA-C 03/27/13 1332

## 2013-03-27 NOTE — ED Notes (Signed)
Belongings are in locker 28.

## 2013-03-27 NOTE — ED Notes (Signed)
Pt belongings moved from locker 28 and given to psych ED staff.

## 2013-03-27 NOTE — ED Notes (Addendum)
Pt states that he is trying to get off alcohol.  Last drink was this morning.  States that he found out he has cancer and is having surgery next week.  Pt states that he doesn't know what kind of cancer he has but he is going to have his left kidney removed.  Requesting detox from alcohol.  Pt is actively vomiting x 2 days and states he had a seizure before he came here.

## 2013-03-27 NOTE — Progress Notes (Signed)
[facility] provided pt with a list of primary care resources, highlighting [facility] [place]. Patient stated that he was pending Medicaid. Patient stated that he was familiar with [facility] the [place].

## 2013-03-27 NOTE — Consult Note (Signed)
  [name] wants detox  Before he has surgery for kidney cancer, he says,  He has a long history of substance abuse and knows what to expect. Recommend detox at [facility] if possible and if not there, then whatever bed can be found.

## 2013-03-27 NOTE — ED Notes (Signed)
Pt aaox3.  Pt c/o sweating, and mild headache.  Pt denies SI/HI.AH or VH at this time.  Pt calm and cooperative

## 2013-03-27 NOTE — BH Assessment (Signed)
Assessment Note   Patient is a 32 year old AA male that requests detox from alcohol. Patient reports that his last drink was earlier today when he drank a bottle of wine.    Patient repots that his longest period of sobriety was for 6 months.  Patient repots that he began drinking when he was 32 years old.  Patient reports that the first time he used crack was 32 years old. Patient reports receiving detox and treatment in the past.  Patient reports a past history of alcohol induced seizures.  Patient reports withdrawal symptoms that include nausea,headache and shacking.  Patient reports drinking 3-4 bottles of wine daily.      Patient reports that he has to be detoxed from alcohol before he can have his surgery on 04-03-2012. Patient reports that he has cancer and the surgery is necessary for his recovery.  Patient endorsed a prior diagnosis of Bipolar Disorder.  Patient reports that he receives medication management and outpatient therapy from [facility].  Patient reports that he has not been compliant with taking his medication since finding out that he has cancer.  Patient reports prior psychiatric hospitalizations in the past.  Patient denies SI/HI.  Patient denies psychosis.     Axis I: Alcohol Dependence, Bipolar Disorder  Axis II: Deferred Axis III:  Past Medical History  Diagnosis Date  . Seizures   . Hypertension   . Depression   . Pancreatitis   . Liver cirrhosis   . Coronary artery disease   . Angina   . Shortness of breath   . Headache(784.0)   . Peripheral vascular disease April 2011    Left Pop  . Hypercholesteremia   . Schizophrenia   . Bipolar 1 disorder   . Cancer of kidney    Axis IV: economic problems, housing problems, occupational problems, other psychosocial or environmental problems, problems related to social environment, problems with access to health care services and problems with primary support group Axis V: 41-50 serious symptoms  Past Medical History:  Past  Medical History  Diagnosis Date  . Seizures   . Hypertension   . Depression   . Pancreatitis   . Liver cirrhosis   . Coronary artery disease   . Angina   . Shortness of breath   . Headache(784.0)   . Peripheral vascular disease April 2011    Left Pop  . Hypercholesteremia   . Schizophrenia   . Bipolar 1 disorder   . Cancer of kidney     Past Surgical History  Procedure Laterality Date  . Chest surgery    . Left leg surgery    . Mastectomy      Family History:  Family History  Problem Relation Age of Onset  . Stroke Other     Social History:  reports that he has been smoking Cigarettes.  He has a .4 pack-year smoking history. He has never used smokeless tobacco. He reports that he uses illicit drugs ("Crack" cocaine) about once per week. He reports that he does not drink alcohol.  Additional Social History:     CIWA: CIWA-Ar BP: 133/98 mmHg Pulse Rate: 86 COWS:    Allergies:  Allergies  Allergen Reactions  . Depakote [Divalproex Sodium]     "Bug out and hallucinate"  . Morphine Itching  . Penicillins Swelling    Home Medications:  (Not in a hospital admission)  OB/GYN Status:  No LMP for male patient.  General Assessment Data Location of Assessment: [facility] ED Is  this a Tele or Face-to-Face Assessment?: Face-to-Face Is this an Initial Assessment or a Re-assessment for this encounter?: Initial Assessment Living Arrangements: Alone Can pt return to current living arrangement?: Yes Admission Status: Voluntary Is patient capable of signing voluntary admission?: Yes Transfer from: Acute Hospital Referral Source: Self/Family/Friend  Medical Screening Exam [facility] Walk-in ONLY) Medical Exam completed: Yes  [facility] Crisis Care Plan Living Arrangements: Alone  Education Status Is patient currently in school?: No  Risk to self Suicidal Ideation: No Suicidal Intent: No Is patient at risk for suicide?: No Suicidal Plan?: No Access to Means: No What has been  your use of drugs/alcohol within the last 12 months?: alcohol and crack  Previous Attempts/Gestures: Yes How many times?: 4 Other Self Harm Risks: None  Triggers for Past Attempts: Unpredictable Intentional Self Injurious Behavior: None Family Suicide History: No Recent stressful life event(s): Conflict (Comment);Job Loss;Financial Problems;Other (Comment) Persecutory voices/beliefs?: No Depression: Yes Depression Symptoms: Despondent;Fatigue;Isolating;Feeling worthless/self pity;Feeling angry/irritable Substance abuse history and/or treatment for substance abuse?: Yes Suicide prevention information given to non-admitted patients: Not applicable  Risk to Others Homicidal Ideation: No Thoughts of Harm to Others: No Current Homicidal Intent: No Current Homicidal Plan: No Access to Homicidal Means: No Identified Victim: none  History of harm to others?: No Assessment of Violence: None Noted Violent Behavior Description: calm Does patient have access to weapons?: No Criminal Charges Pending?: No Does patient have a court date: No  Psychosis Hallucinations: None noted Delusions: None noted  Mental Status Report Appear/Hygiene: Disheveled Eye Contact: Good Motor Activity: Freedom of movement Speech: Logical/coherent Level of Consciousness: Quiet/awake Mood: Anxious Affect: Preoccupied Anxiety Level: Minimal Thought Processes: Coherent;Relevant Judgement: Unimpaired Orientation: Person;Place;Time;Situation Obsessive Compulsive Thoughts/Behaviors: None  Cognitive Functioning Concentration: Decreased Memory: Remote Intact;Recent Intact IQ: Average Insight: Fair Impulse Control: Poor Appetite: Fair Weight Loss: 0 Weight Gain: 0 Sleep: Decreased Total Hours of Sleep: 3 Vegetative Symptoms: Decreased grooming  ADLScreening [facility] Assessment Services) [name] cognitive ability adequate to safely complete daily activities?: Yes Patient able to express need for  assistance with ADLs?: Yes Independently performs ADLs?: Yes (appropriate for developmental age)  Prior Inpatient Therapy Prior Inpatient Therapy: Yes Prior Therapy Dates: 2013,2012  Prior Therapy Facilty/Provider(s): [facility] Reason for Treatment: SI, Sepression  Prior Outpatient Therapy Prior Outpatient Therapy: Yes Prior Therapy Dates: ongoing  Prior Therapy Facilty/Provider(s): [facility]  Reason for Treatment: Medication Management and Outpatient Therapy   ADL Screening (condition at time of admission) [name] cognitive ability adequate to safely complete daily activities?: Yes Patient able to express need for assistance with ADLs?: Yes Independently performs ADLs?: Yes (appropriate for developmental age)  Home Assistive Devices/Equipment Home Assistive Devices/Equipment: None      Values / Beliefs Cultural Requests During Hospitalization: None Spiritual Requests During Hospitalization: None        Additional Information 1:1 In Past 12 Months?: No CIRT Risk: No Elopement Risk: No Does patient have medical clearance?: Yes     Disposition: Inpatient detox.  Disposition Initial Assessment Completed for this Encounter: Yes Disposition of Patient: Other dispositions Other disposition(s): Other (Comment)  On Site Evaluation by:   Reviewed with Physician:    Phillip Heal LaVerne 03/27/2013 2:18 PM

## 2013-03-28 ENCOUNTER — Encounter (HOSPITAL_COMMUNITY): Payer: Self-pay | Admitting: *Deleted

## 2013-03-28 ENCOUNTER — Encounter (HOSPITAL_COMMUNITY): Payer: Self-pay | Admitting: Registered Nurse

## 2013-03-28 ENCOUNTER — Inpatient Hospital Stay (HOSPITAL_COMMUNITY)
Admission: AD | Admit: 2013-03-28 | Discharge: 2013-03-30 | DRG: 897 | Disposition: A | Discharge status: Home / Self Care | Payer: No Typology Code available for payment source | Source: Intra-hospital | Attending: Psychiatry | Admitting: Psychiatry

## 2013-03-28 DIAGNOSIS — F339 Major depressive disorder, recurrent, unspecified: Secondary | ICD-10-CM | POA: Diagnosis present

## 2013-03-28 DIAGNOSIS — F419 Anxiety disorder, unspecified: Secondary | ICD-10-CM

## 2013-03-28 DIAGNOSIS — Z79899 Other long term (current) drug therapy: Secondary | ICD-10-CM

## 2013-03-28 DIAGNOSIS — F332 Major depressive disorder, recurrent severe without psychotic features: Secondary | ICD-10-CM

## 2013-03-28 DIAGNOSIS — F1020 Alcohol dependence, uncomplicated: Secondary | ICD-10-CM | POA: Diagnosis present

## 2013-03-28 DIAGNOSIS — F10939 Alcohol use, unspecified with withdrawal, unspecified: Principal | ICD-10-CM | POA: Diagnosis present

## 2013-03-28 DIAGNOSIS — C649 Malignant neoplasm of unspecified kidney, except renal pelvis: Secondary | ICD-10-CM | POA: Diagnosis present

## 2013-03-28 DIAGNOSIS — F4310 Post-traumatic stress disorder, unspecified: Secondary | ICD-10-CM

## 2013-03-28 DIAGNOSIS — I10 Essential (primary) hypertension: Secondary | ICD-10-CM | POA: Diagnosis present

## 2013-03-28 DIAGNOSIS — I2510 Atherosclerotic heart disease of native coronary artery without angina pectoris: Secondary | ICD-10-CM | POA: Diagnosis present

## 2013-03-28 DIAGNOSIS — F1010 Alcohol abuse, uncomplicated: Secondary | ICD-10-CM

## 2013-03-28 DIAGNOSIS — F10239 Alcohol dependence with withdrawal, unspecified: Principal | ICD-10-CM | POA: Diagnosis present

## 2013-03-28 DIAGNOSIS — F331 Major depressive disorder, recurrent, moderate: Secondary | ICD-10-CM | POA: Diagnosis present

## 2013-03-28 DIAGNOSIS — F1420 Cocaine dependence, uncomplicated: Secondary | ICD-10-CM | POA: Diagnosis present

## 2013-03-28 MED ORDER — VITAMIN B-1 100 MG PO TABS
100.0000 mg | ORAL_TABLET | Freq: Every day | ORAL | Status: DC
  Administered 2013-03-29 – 2013-03-30 (×2): 100 mg via ORAL
  Filled 2013-03-28 (×4): qty 1

## 2013-03-28 MED ORDER — HYDROXYZINE HCL 25 MG PO TABS
25.0000 mg | ORAL_TABLET | Freq: Four times a day (QID) | ORAL | Status: DC | PRN
  Administered 2013-03-28 – 2013-03-29 (×2): 25 mg via ORAL

## 2013-03-28 MED ORDER — METOPROLOL TARTRATE 25 MG PO TABS
25.0000 mg | ORAL_TABLET | Freq: Two times a day (BID) | ORAL | Status: DC
  Administered 2013-03-28 – 2013-03-30 (×4): 25 mg via ORAL
  Filled 2013-03-28: qty 8
  Filled 2013-03-28 (×6): qty 1
  Filled 2013-03-28: qty 8

## 2013-03-28 MED ORDER — TRAZODONE HCL 50 MG PO TABS
150.0000 mg | ORAL_TABLET | Freq: Every day | ORAL | Status: DC
  Administered 2013-03-28 – 2013-03-29 (×2): 150 mg via ORAL
  Filled 2013-03-28 (×2): qty 3
  Filled 2013-03-28: qty 12
  Filled 2013-03-28: qty 3

## 2013-03-28 MED ORDER — THIAMINE HCL 100 MG/ML IJ SOLN
100.0000 mg | Freq: Once | INTRAMUSCULAR | Status: AC
  Administered 2013-03-28: 100 mg via INTRAMUSCULAR

## 2013-03-28 MED ORDER — ALUM & MAG HYDROXIDE-SIMETH 200-200-20 MG/5ML PO SUSP: 30.0000 mL | ORAL | Status: DC | PRN

## 2013-03-28 MED ORDER — MAGNESIUM HYDROXIDE 400 MG/5ML PO SUSP: 30.0000 mL | Freq: Every day | ORAL | Status: DC | PRN

## 2013-03-28 MED ORDER — ADULT MULTIVITAMIN W/MINERALS CH
1.0000 | ORAL_TABLET | Freq: Every day | ORAL | Status: DC
  Administered 2013-03-28 – 2013-03-30 (×3): 1 via ORAL
  Filled 2013-03-28 (×5): qty 1

## 2013-03-28 MED ORDER — CHLORDIAZEPOXIDE HCL 25 MG PO CAPS: 25.0000 mg | ORAL_CAPSULE | Freq: Four times a day (QID) | ORAL | Status: DC | PRN

## 2013-03-28 MED ORDER — LOPERAMIDE HCL 2 MG PO CAPS: 2.0000 mg | ORAL_CAPSULE | ORAL | Status: DC | PRN

## 2013-03-28 MED ORDER — ARIPIPRAZOLE 2 MG PO TABS
4.0000 mg | ORAL_TABLET | Freq: Every day | ORAL | Status: DC
  Administered 2013-03-28 – 2013-03-29 (×2): 4 mg via ORAL
  Filled 2013-03-28: qty 8
  Filled 2013-03-28 (×3): qty 2

## 2013-03-28 MED ORDER — ONDANSETRON 4 MG PO TBDP
4.0000 mg | ORAL_TABLET | Freq: Four times a day (QID) | ORAL | Status: DC | PRN
Start: 2013-03-28 — End: 2013-03-30
  Administered 2013-03-29 – 2013-03-30 (×2): 4 mg via ORAL

## 2013-03-28 MED ORDER — ACETAMINOPHEN 325 MG PO TABS
650.0000 mg | ORAL_TABLET | Freq: Four times a day (QID) | ORAL | Status: DC | PRN
  Administered 2013-03-29: 650 mg via ORAL

## 2013-03-28 MED ORDER — DULOXETINE HCL 30 MG PO CPEP
30.0000 mg | ORAL_CAPSULE | Freq: Every day | ORAL | Status: DC
  Administered 2013-03-29 – 2013-03-30 (×2): 30 mg via ORAL
  Filled 2013-03-28 (×3): qty 1
  Filled 2013-03-28: qty 4

## 2013-03-28 MED ORDER — AMLODIPINE BESYLATE 5 MG PO TABS
5.0000 mg | ORAL_TABLET | Freq: Every day | ORAL | Status: DC
  Administered 2013-03-29 – 2013-03-30 (×2): 5 mg via ORAL
  Filled 2013-03-28 (×2): qty 1
  Filled 2013-03-28: qty 4
  Filled 2013-03-28: qty 1

## 2013-03-28 MED ORDER — ATORVASTATIN CALCIUM 20 MG PO TABS
20.0000 mg | ORAL_TABLET | Freq: Every day | ORAL | Status: DC
  Administered 2013-03-28 – 2013-03-29 (×2): 20 mg via ORAL
  Filled 2013-03-28: qty 1
  Filled 2013-03-28: qty 4
  Filled 2013-03-28 (×2): qty 1

## 2013-03-28 NOTE — Progress Notes (Signed)
Pt accepted by Dr. Lolly Mustache for inpatient detox, pending 300 hall bed.   Catha Gosselin, LCSW [phone]  ED CSW .03/28/2013 1042am

## 2013-03-28 NOTE — Tx Team (Signed)
Initial Interdisciplinary Treatment Plan  PATIENT STRENGTHS: (choose at least two) Average or above average intelligence Capable of independent living Communication skills General fund of knowledge Motivation for treatment/growth Supportive family/friends  PATIENT STRESSORS: Financial difficulties Health problems Occupational concerns Substance abuse Traumatic event   PROBLEM LIST: Problem List/Patient Goals Date to be addressed Date deferred Reason deferred Estimated date of resolution  Recent diagnosis of kidney       Cancer w/pending surgery 03/28/2013     Substance abuse/detox 03/28/2013     Financial concerns/pending      Disability hearing 03/28/2013     Temporary living arrangement 03/28/2013                        DISCHARGE CRITERIA:  Ability to meet basic life and health needs Improved stabilization in mood, thinking, and/or behavior Medical problems require only outpatient monitoring Motivation to continue treatment in a less acute level of care Need for constant or close observation no longer present Reduction of life-threatening or endangering symptoms to within safe limits Verbal commitment to aftercare and medication compliance Withdrawal symptoms are absent or subacute and managed without 24-hour nursing intervention  PRELIMINARY DISCHARGE PLAN: Attend 12-step recovery group Return to previous living arrangement  PATIENT/FAMIILY INVOLVEMENT: This treatment plan has been presented to and reviewed with the patient, [name].  The patient and family have been given the opportunity to ask questions and make suggestions.  Cranford Mon 03/28/2013, 4:00 PM

## 2013-03-28 NOTE — Progress Notes (Signed)
Adult Psychoeducational Group Note  Date:  03/28/2013 Time:  9:39 PM  Group Topic/Focus:  NA group  Participation Level:  Minimal  Participation Quality:  Appropriate  Affect:  Appropriate  Cognitive:  Alert  Insight: Appropriate  Engagement in Group:  Improving  Modes of Intervention:  Discussion  Additional Comments:    Flonnie Hailstone 03/28/2013, 9:39 PM

## 2013-03-28 NOTE — Progress Notes (Signed)
Patient ID: [name], male   DOB: Jul 14, 1981, 32 y.o.   MRN: [id number] Mr. Hartshorn is a 32 yo male with previous admissions requesting detox from ETOH.  Patient also has been smoking crack for the past month.  Patient reports that he is having surgery on 9/16; he is having his left kidney removed due to a mass.  It is unknown whether it is malignant.  Patient is having his surgery at [facility].  He reports that he wants to be detoxed before his surgery due to the anesthesia.  He admits to drinking 2 bottles of wine and 2 40 oz beers per day.  His last drink was yesterday and last use of crack was this previous Friday.  [name] UDS positive for cocaine; negative for ETOH.  Patient states that he was patient at [facility] 8 months ago.  His last admission here was last year.  Patient has numerous health issues including CAD, angina, HTN, SOB, PVD, cancer of kidney, hx of seizures, mastectomy and pancreatitis.  Patient skin search revealed scars across chest from mastectomy and L leg where he had surgery due clogged arteries.  He denies any SI/HI/AVH. He denies any depression or hopelessness.  He reports fleeting thoughts of harming himself with no specific plan.  He does have a hx of suicide attempts. Patient oriented to unit and room.  Patient pleasant and cooperative.

## 2013-03-28 NOTE — Progress Notes (Signed)
Patient ID: [name], male   DOB: Mar 19, 1981, 32 y.o.   MRN: [id number] D: Pt. Reports "I came to detox before surgery, I'm having my left kidney removed" "feeling anxious" A: Writer provided emotional support encouraged pt. To consider the fact he will only have one kidney and he needs to abstain from drugs/alcohol to preserve that one. Writer reviewed med administration times. Staff will monitor q40min for safety. R: Pt. Received support, and attended group.

## 2013-03-28 NOTE — ED Provider Notes (Signed)
Medical screening examination/treatment/procedure(s) were performed by non-physician practitioner and as supervising physician I was immediately available for consultation/collaboration.  Arben Packman, MD 03/28/13 1705 

## 2013-03-28 NOTE — Consult Note (Signed)
  Evaluation for inpatient treatment; Face to face interview and consulted with Dr.  Referred by:  EDP   HPI:  Patient presented to [facility] for detox.  Patient states that he had been alcohol free for 6-7 months and relapsed when he thought he would become homeless related to losing job and unable to pay his rent.  Patient states that he is also scheduled to have kidney surgery related to kidney cancer.  Patient states that at this time his grandparents ar paying for him to live in a motel which has decreased his stress and depression.  Patient continues to have some tremors and want detox.     @ Axis I: Alcohol Abuse, Major Depression, Recurrent severe and Post Traumatic Stress Disorder Axis II: Deferred Axis III:  Past Medical History  Diagnosis Date  . Seizures   . Hypertension   . Depression   . Pancreatitis   . Liver cirrhosis   . Coronary artery disease   . Angina   . Shortness of breath   . Headache(784.0)   . Peripheral vascular disease April 2011    Left Pop  . Hypercholesteremia   . Schizophrenia   . Bipolar 1 disorder   . Cancer of kidney    Axis IV: economic problems, housing problems, occupational problems, other psychosocial or environmental problems and problems related to social environment Axis V: 51-60 moderate symptoms  @Psychiatric  Specialty Exam: @PHYSEXAMBYAGE2 @  @ROS @  Blood pressure 133/86, pulse 56, temperature 98 F (36.7 C), temperature source Oral, resp. rate 18, SpO2 100.00%.There is no weight on file to calculate BMI.  General Appearance: Casual  Eye Contact::  Good  Speech:  Clear and Coherent and Normal Rate  Volume:  Normal  Mood:  Depressed  Affect:  Depressed  Thought Process:  Circumstantial, Coherent and Goal Directed  Orientation:  Full (Time, Place, and Person)  Thought Content:  Rumination  Suicidal Thoughts:  No  Homicidal Thoughts:  No  Memory:  Immediate;   Good Recent;   Good Remote;   Good  Judgement:  Fair  Insight:  Fair  and Present  Psychomotor Activity:  Tremor  Concentration:  Good  Recall:  Good  Akathisia:  No  Handed:  Right  AIMS (if indicated):     Assets:  Communication Skills Desire for Improvement Social Support  Sleep:        Psychiatric Specialty Exam:   Plan: Disposition: Patient accepted to [name].  Waiting on bed 300 hall.  If no bed availability please seek placement elsewhere.    Shuvon B. Rankin FNP-BC Family Nurse Practitioner, Board Certified  I have personally seen the patient and agreed with the findings and involved in the treatment plan. Kathryne Sharper, MD

## 2013-03-28 NOTE — ED Notes (Signed)
Report called to Rayfield Citizen RN at [facility] transport w/security.

## 2013-03-29 ENCOUNTER — Encounter (HOSPITAL_COMMUNITY): Payer: Self-pay | Admitting: Psychiatry

## 2013-03-29 DIAGNOSIS — F331 Major depressive disorder, recurrent, moderate: Secondary | ICD-10-CM

## 2013-03-29 DIAGNOSIS — F1420 Cocaine dependence, uncomplicated: Secondary | ICD-10-CM

## 2013-03-29 DIAGNOSIS — F1020 Alcohol dependence, uncomplicated: Secondary | ICD-10-CM

## 2013-03-29 DIAGNOSIS — F4310 Post-traumatic stress disorder, unspecified: Secondary | ICD-10-CM

## 2013-03-29 DIAGNOSIS — F10239 Alcohol dependence with withdrawal, unspecified: Principal | ICD-10-CM

## 2013-03-29 NOTE — [facility] Suicide Risk Assessment (Signed)
[facility] INPATIENT: Family/Significant Other Suicide Prevention Education  Suicide Prevention Education:  Education Completed; No one has been identified by the patient as the family member/significant other with whom the patient will be residing, and identified as the person(s) who will aid the patient in the event of a mental health crisis (suicidal ideations/suicide attempt).   Pt did not c/o SI at admission, nor have they endorsed SI during their stay here. SPE not required. SPI pamphlet provided to pt and he was encouraged to share with support system.   [employer], LCSWA 03/29/2013 12:46 PM
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
## 2013-03-29 NOTE — Progress Notes (Signed)
Patient ID: [name], male   DOB: 1980-12-01, 32 y.o.   MRN: [id number] D: Pt. Expressing some anxiety about surgery on next week, but notes he has the support of his mom/dad, grandma. "medications doing good". A: Writer provided emotional support. Staff encouraged group. Staff will monitor q4min for safety. R: Pt. Is safe on the unit and attended group.
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
## 2013-03-29 NOTE — [facility] Suicide Risk Assessment (Signed)
Suicide Risk Assessment  Admission Assessment     Nursing information obtained from:    Demographic factors:    Current Mental Status:    Loss Factors:    Historical Factors:    Risk Reduction Factors:     CLINICAL FACTORS:   Depression:   Comorbid alcohol abuse/dependence Alcohol/Substance Abuse/Dependencies  COGNITIVE FEATURES THAT CONTRIBUTE TO RISK:  Closed-mindedness Polarized thinking Thought constriction (tunnel vision)    SUICIDE RISK:   Moderate:  Frequent suicidal ideation with limited intensity, and duration, some specificity in terms of plans, no associated intent, good self-control, limited dysphoria/symptomatology, some risk factors present, and identifiable protective factors, including available and accessible social support.  PLAN OF CARE: Supportive approach/coping skills/relapse prevention                                Reassess and address the co morbidities  I certify that inpatient services furnished can reasonably be expected to improve the patient's condition.  Jiayi Lengacher A 03/29/2013, 4:50 PM

## 2013-03-29 NOTE — Progress Notes (Signed)
D: Patient denies SI/HI and auditory and visual hallucinations. Patient has an anxious mood and affect. Patient states that he is experiencing nausea and that he believes it is because of "the detox." The patient states that his anxiety and agitation are decreasing and that he feels "a little better today." The patient is isolating to his room throughout the day and is having minimal participation within the milieu.   A: Patient given emotional support from RN. Patient encouraged to come to staff with concerns and/or questions. [name] medication routine continued. [name] orders and plan of care reviewed. Patient given PRN zofran by night RN.  R: Patient remains appropriate and cooperative. Patient states that his nausea has decreased after receiving the medication. Will continue to monitor patient q15 minutes for safety.

## 2013-03-29 NOTE — [facility] Group Notes (Signed)
[facility] LCSW Group Therapy  03/29/2013 3:57 PM  Type of Therapy:  Group Therapy  Participation Level:  Active  Participation Quality:  Attentive  Affect:  Appropriate  Cognitive:  Alert  Insight:  Improving  Engagement in Therapy:  Improving  Modes of Intervention:  Discussion, Education, Exploration, Socialization and Support  Summary of Progress/Problems:  Finding Balance in Life. Today's group focused on defining balance in one's own words, identifying things that can knock one off balance, and exploring healthy ways to maintain balance in life. Group members were asked to provide an example of a time when they felt off balance, describe how they handled that situation,and process healthier ways to regain balance in the future. Group members were asked to share the most important tool for maintaining balance that they learned while at [facility] and how they plan to apply this method after discharge. [name] was attentive and engaged throughout today's group. He shared that after finding out that he had cancer, he relapsed on alcohol and cocaine after 8 months of sobriety. [name] talked about the emotional pain caused by his diagnosis and stated that he is now ready to take control and take care of himself. [name] shows progress in the group setting and demonstrates improving insight AEB his ability to share his trigger for relapse and identify ways to reestablish a sense of balance in his life. "I plan to get back into AA and start taking better care of my mental health by taking my meds when I am supposed to. I also need to start eating healthier and getting enough sleep at night. The basics."    Smart, Daryel Kenneth 03/29/2013, 3:57 PM

## 2013-03-30 DIAGNOSIS — F339 Major depressive disorder, recurrent, unspecified: Secondary | ICD-10-CM

## 2013-03-30 MED ORDER — DULOXETINE HCL 30 MG PO CPEP: 30.0000 mg | ORAL_CAPSULE | Freq: Every day | ORAL | Status: DC

## 2013-03-30 MED ORDER — METOPROLOL TARTRATE 25 MG PO TABS: 25.0000 mg | ORAL_TABLET | Freq: Two times a day (BID) | ORAL | Status: DC

## 2013-03-30 MED ORDER — SIMVASTATIN 40 MG PO TABS: 40.0000 mg | ORAL_TABLET | Freq: Every evening | ORAL | Status: DC

## 2013-03-30 MED ORDER — AMLODIPINE BESYLATE 5 MG PO TABS: 5.0000 mg | ORAL_TABLET | Freq: Every day | ORAL | Status: DC

## 2013-03-30 MED ORDER — ARIPIPRAZOLE 2 MG PO TABS: 4.0000 mg | ORAL_TABLET | Freq: Every day | ORAL | Status: DC

## 2013-03-30 MED ORDER — SIMVASTATIN 40 MG PO TABS
40.0000 mg | ORAL_TABLET | Freq: Every day | ORAL | Status: DC
  Filled 2013-03-30: qty 4

## 2013-03-30 MED ORDER — TRAZODONE HCL 150 MG PO TABS: 150.0000 mg | ORAL_TABLET | Freq: Every day | ORAL | Status: DC

## 2013-03-30 NOTE — Clinical Social Work Note (Signed)
Per pt request, CSW and MD provided letter including diagnosis, hospital length of stay, and describing pt's continuing difficulty with functionality with bipolar disorder and gave to pt directly. Pt plans to use this letter to support case for extending disability services. Signed by MD.

## 2013-03-30 NOTE — [facility] Group Notes (Signed)
[facility] LCSW Aftercare Discharge Planning Group Note   03/30/2013 11:00 AM  Participation Quality:  DID NOT ATTEND   [name], [name]   

## 2013-03-30 NOTE — Progress Notes (Signed)
Adult Psychoeducational Group Note  Date:  03/30/2013 Time:  11:31 AM  Group Topic/Focus:  Relapse Prevention Planning:   The focus of this group is to define relapse and discuss the need for planning to combat relapse.  Participation Level:  Did Not Attend  [name] 03/30/2013, 11:31 AM
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
## 2013-03-30 NOTE — Progress Notes (Signed)
D/C instructions/meds/follow-up appointments reviewed, pt verbalized understanding, pt's belongings returned to pt, samples given. 

## 2013-03-30 NOTE — Progress Notes (Signed)
[facility] Adult Case Management Discharge Plan :  Will you be returning to the same living situation after discharge: Yes,  home with mother At discharge, do you have transportation home?:Yes,  mother Do you have the ability to pay for your medications:Yes,  mental health  Release of information consent forms completed and in the chart;  [name] signature needed at discharge.  Patient to Follow up at: Follow-up Information   Follow up with [facility]. (Walk in Monday through Friday between 8AM-9AM for hospital followup/medication mangement. )    Contact information:   [address], [place] [address] Phone: [phone] Fax: [phone]      Patient denies SI/HI:   Yes,  during admission, group, and self report.    Safety Planning and Suicide Prevention discussed:  Yes,  SPE not required for this pt. SPI pamphlet provided to pt and he was encouraged to share with his support network.   Smart, Emannuel Vise 03/30/2013, 10:40 AM

## 2013-03-30 NOTE — Discharge Summary (Signed)
Physician Discharge Summary Note  Patient:  [name] is an 32 y.o., male MRN:  [id number] DOB:  26-Jun-1981 Patient phone:  There is no home phone number on file.  Patient address:   [address] [place] [address],   Date of Admission:  03/28/2013 Date of Discharge: 03/30/13  Reason for Admission:  Alcohol detox  Discharge Diagnoses: Principal Problem:   Alcohol dependence Active Problems:   Major depressive disorder, recurrent episode   PTSD (post-traumatic stress disorder)  Review of Systems  Constitutional: Negative.   HENT: Negative.   Eyes: Negative.   Respiratory: Negative.   Cardiovascular: Negative.   Gastrointestinal: Negative.   Genitourinary: Negative.   Musculoskeletal: Negative.   Skin: Negative.   Neurological: Negative.   Endo/Heme/Allergies: Negative.   Psychiatric/Behavioral: Positive for depression and substance abuse (Alcoholism, cocaine dependence). Negative for suicidal ideas, hallucinations and memory loss. The patient is nervous/anxious and has insomnia.    DSM5: Schizophrenia Disorders:  NA Obsessive-Compulsive Disorders:  NA Trauma-Stressor Disorders:  Posttraumatic Stress Disorder (309.81) Substance/Addictive Disorders:  Alcohol Withdrawal (291.81), Alcohol dependence, Cocaine dependence Depressive Disorders:  Major Depressive Disorder - Moderate (296.22)  Axis Diagnosis:   AXIS I:  Alcohol Withdrawal (291.81), Alcohol dependence, cocaine dependence, Major depressive disorder, moderate AXIS II:  Deferred AXIS III:   Past Medical History  Diagnosis Date  . Seizures   . Hypertension   . Depression   . Pancreatitis   . Liver cirrhosis   . Coronary artery disease   . Angina   . Shortness of breath   . Headache(784.0)   . Peripheral vascular disease April 2011    Left Pop  . Hypercholesteremia   . Schizophrenia   . Bipolar 1 disorder   . Cancer of kidney    AXIS IV:  other psychosocial or environmental problems and  Polysubstance abuse/dependence AXIS V:  62  Level of Care:  OP  Hospital Course: This yet, another admission assessment and evaluation for this 32 year old African-American male. Patient has been hospitalized in this hospital on other numerous occasions. This time around, he reports being taken to the [facility] ED by his mother on Tuesday. [name] reports, "When I was in this hospital last, I was sent to the [facility] treatment, where I stayed for 4 months receiving substance abuse treatment. When I was discharged to a half-way house, I had stopped drinking and using drugs. Then I moved to [employer]. Oneday, I started having pain to my stomach. I went to see a doctor for the pain. I was diagnosed with left Kidney cancer. I also lost my job around that's time. I got very sad. I started drinking and using cocaine again to cope. I was drinking 3 bottles of wine daily for the last 3 weeks. I was also using crack cocaine. I was scheduled for surgery to remove my left Kidney on 04/02/13. I know that I could not have this much alcohol in my system prior to my surgery. That is why I decided to come to have a detox treatment. I was sober x 8 months prior to my recent relapse".  [name]'s stay in this hospital was brief. He came requesting alcohol detox. After admission assessment/evaluation, it was obvious that [name] was not presenting any acute withdrawal symptoms that required complete administration of Librium detoxification treatment protocol. However, he was started on Librium 25 mg prn for to combat any anxiety related withdrawal symptoms that he may present. He also received Duloxetin  30 mg daily for depression, Abilify 4 mg Q bedtime for mood control and Trazodone 150 mg Q bedtime for sleep . [name] presented other medical issues and or concerns that required treatment and or monitoring. He received medication management for all those issues  He also reported that he was  recently diagnosed with a left kidney cancer and had a surgery to remove the cancer ravaged kidney on 04/02/13. And because he was not showing any acute withdrawal symptoms and has already set a date for the surgical removal of his cancerous kidney, [name] is being discharged today to have this taken care of. He will need some pre-op prep and work-up done prior to the 04/02/13.  He is currently being discharged to follow-up care at the [facility] here in [place], [place]. The address and contact information for this clinic provided for patient in writng. Upon discharge, patient adamantly denies any suicidal, homicidal ideations, auditory, visual hallucinations, delusional thoughts, paranoia and or withdrawal symptoms. He was provided 14 days worth supply samples of his [facility] discharge medications. He left [facility] with all personal belongings in no apparent distress. Transportation per mother.  Consults:  psychiatry  Significant Diagnostic Studies:  labs: CBC with diff, CMP, UDS, Toxicology tests, U/A  Discharge Vitals:   Blood pressure 119/78, pulse 89, temperature 98.7 F (37.1 C), temperature source Oral, resp. rate 20, height 5\' 4"  (1.626 m), weight 78.472 kg (173 lb). Body mass index is 29.68 kg/(m^2). Lab Results:   Results for orders placed during the hospital encounter of 03/27/13 (from the past 72 hour(s))  URINE RAPID DRUG SCREEN (HOSP PERFORMED)     Status: Abnormal   Collection Time    03/27/13 12:32 PM      Result Value Range   Opiates NONE DETECTED  NONE DETECTED   Cocaine POSITIVE (*) NONE DETECTED   Benzodiazepines NONE DETECTED  NONE DETECTED   Amphetamines NONE DETECTED  NONE DETECTED   Tetrahydrocannabinol NONE DETECTED  NONE DETECTED   Barbiturates NONE DETECTED  NONE DETECTED   Comment:            DRUG SCREEN FOR MEDICAL PURPOSES     ONLY.  IF CONFIRMATION IS NEEDED     FOR ANY PURPOSE, NOTIFY LAB     WITHIN 5 DAYS.                LOWEST DETECTABLE LIMITS     FOR URINE  DRUG SCREEN     Drug Class       Cutoff (ng/mL)     Amphetamine      1000     Barbiturate      200     Benzodiazepine   200     Tricyclics       300     Opiates          300     Cocaine          300     THC              50  ACETAMINOPHEN LEVEL     Status: None   Collection Time    03/27/13 12:46 PM      Result Value Range   Acetaminophen (Tylenol), Serum <15.0  10 - 30 ug/mL   Comment:            THERAPEUTIC CONCENTRATIONS VARY     SIGNIFICANTLY. A RANGE OF 10-30     ug/mL MAY BE AN EFFECTIVE  CONCENTRATION FOR MANY PATIENTS.     HOWEVER, SOME ARE BEST TREATED     AT CONCENTRATIONS OUTSIDE THIS     RANGE.     ACETAMINOPHEN CONCENTRATIONS     >150 ug/mL AT 4 HOURS AFTER     INGESTION AND >50 ug/mL AT 12     HOURS AFTER INGESTION ARE     OFTEN ASSOCIATED WITH TOXIC     REACTIONS.  CBC     Status: None   Collection Time    03/27/13 12:46 PM      Result Value Range   WBC 4.3  4.0 - 10.5 K/uL   RBC 5.07  4.22 - 5.81 MIL/uL   Hemoglobin 15.7  13.0 - 17.0 g/dL   HCT 16.1  09.6 - 04.5 %   MCV 87.0  78.0 - 100.0 fL   MCH 31.0  26.0 - 34.0 pg   MCHC 35.6  30.0 - 36.0 g/dL   RDW 40.9  81.1 - 91.4 %   Platelets 196  150 - 400 K/uL  COMPREHENSIVE METABOLIC PANEL     Status: Abnormal   Collection Time    03/27/13 12:46 PM      Result Value Range   Sodium 137  135 - 145 mEq/L   Potassium 4.1  3.5 - 5.1 mEq/L   Chloride 102  96 - 112 mEq/L   CO2 25  19 - 32 mEq/L   Glucose, Bld 101 (*) 70 - 99 mg/dL   BUN 7  6 - 23 mg/dL   Creatinine, Ser 7.82  0.50 - 1.35 mg/dL   Calcium 9.5  8.4 - 95.6 mg/dL   Total Protein 7.5  6.0 - 8.3 g/dL   Albumin 3.7  3.5 - 5.2 g/dL   AST 50 (*) 0 - 37 U/L   ALT 50  0 - 53 U/L   Alkaline Phosphatase 61  39 - 117 U/L   Total Bilirubin 0.3  0.3 - 1.2 mg/dL   GFR calc non Af Amer >90  >90 mL/min   GFR calc Af Amer >90  >90 mL/min   Comment: (NOTE)     The eGFR has been calculated using the CKD EPI equation.     This calculation has not been  validated in all clinical situations.     eGFR's persistently <90 mL/min signify possible Chronic Kidney     Disease.  ETHANOL     Status: None   Collection Time    03/27/13 12:46 PM      Result Value Range   Alcohol, Ethyl (B) <11  0 - 11 mg/dL   Comment:            LOWEST DETECTABLE LIMIT FOR     SERUM ALCOHOL IS 11 mg/dL     FOR MEDICAL PURPOSES ONLY  SALICYLATE LEVEL     Status: Abnormal   Collection Time    03/27/13 12:46 PM      Result Value Range   Salicylate Lvl <2.0 (*) 2.8 - 20.0 mg/dL    Physical Findings: AIMS: Facial and Oral Movements Muscles of Facial Expression: None, normal Lips and Perioral Area: None, normal Jaw: None, normal Tongue: None, normal,Extremity Movements Upper (arms, wrists, hands, fingers): None, normal Lower (legs, knees, ankles, toes): None, normal, Trunk Movements Neck, shoulders, hips: None, normal, Overall Severity Severity of abnormal movements (highest score from questions above): None, normal Incapacitation due to abnormal movements: None, normal [name] awareness of abnormal movements (rate only patient's report): No Awareness,  Dental Status Current problems with teeth and/or dentures?: No Does patient usually wear dentures?: No  CIWA:  CIWA-Ar Total: 1 COWS:     Psychiatric Specialty Exam: See Psychiatric Specialty Exam and Suicide Risk Assessment completed by Attending Physician prior to discharge.  Discharge destination:  Home  Is patient on multiple antipsychotic therapies at discharge:  No   Has Patient had three or more failed trials of antipsychotic monotherapy by history:  No  Recommended Plan for Multiple Antipsychotic Therapies: NA       Future Appointments Provider Department Dept Phone   01/15/2014 3:00 PM [facility] [phone]   01/15/2014 3:30 PM [facility] [phone]   01/15/2014 4:00 PM Larina Earthly, MD [facility] [phone]       Medication List    STOP taking these medications       diphenhydrAMINE 25 MG tablet  Commonly known as:  BENADRYL      TAKE these medications     Indication   amLODipine 5 MG tablet  Commonly known as:  NORVASC  Take 1 tablet (5 mg total) by mouth daily. For hypertension management   Indication:  High Blood Pressure     ARIPiprazole 2 MG tablet  Commonly known as:  ABILIFY  Take 2 tablets (4 mg total) by mouth at bedtime. For mood control   Indication:  Mood control     DULoxetine 30 MG capsule  Commonly known as:  CYMBALTA  Take 1 capsule (30 mg total) by mouth daily. For depression   Indication:  Major Depressive Disorder     metoprolol tartrate 25 MG tablet  Commonly known as:  LOPRESSOR  Take 1 tablet (25 mg total) by mouth 2 (two) times daily. For high blood pressure control/management   Indication:  High Blood Pressure     simvastatin 40 MG tablet  Commonly known as:  ZOCOR  Take 1 tablet (40 mg total) by mouth every evening. For high cholesterol control   Indication:  Inherited Homozygous Hypercholesterolemia, Nonfamilial Heterozygous Hypercholesterolemia     traZODone 150 MG tablet  Commonly known as:  DESYREL  Take 1 tablet (150 mg total) by mouth at bedtime. For sleep   Indication:  Trouble Sleeping       Follow-up Information   Follow up with [facility]. (Walk in Monday through Friday between 8AM-9AM for hospital followup/medication mangement. )    Contact information:   [address] [place] [address] Phone: [phone] Fax: [phone]     Follow-up recommendations:  Activity:  As tolerated Diet: As recommended by your primary care doctor. Keep all scheduled follow-up appointments as recommended. Continue to work your relapse prevention plan Comments: Take all your medications as prescribed by your mental healthcare provider. Report any adverse effects and or reactions from your medicines to  your outpatient provider promptly. Patient is instructed and cautioned to not engage in alcohol and or illegal drug use while on prescription medicines. In the event of worsening symptoms, patient is instructed to call the crisis hotline, 911 and or go to the nearest ED for appropriate evaluation and treatment of symptoms. Follow-up with your primary care provider for your other medical issues, concerns and or health care needs.   Total Discharge Time:  Greater than 30 minutes.  Signed: Sanjuana Kava, PMHNP-BC 03/30/2013, 9:21 AM Agree with assessment and plan Reymundo Poll. Dub Mikes, M.D.

## 2013-03-30 NOTE — Tx Team (Signed)
Interdisciplinary Treatment Plan Update (Adult)  Date: 03/30/2013  Time Reviewed:10:35 AM  Progress in Treatment:  Attending groups: Yes Participating in groups:  Yes Taking medication as prescribed: Yes  Tolerating medication: Yes  Family/Significant othe contact made: No. SPE not required for this pt as he did not endorse SI during admission or stay at [facility].  Patient understands diagnosis: Yes, AEB seeking treatment for etoh detox and medication management for bipolar disorder.  Discussing patient identified problems/goals with staff: Yes  Medical problems stabilized or resolved: Yes  Denies suicidal/homicidal ideation: Yes  Patient has not harmed self or Others: Yes  New problem(s) identified: n/a  Discharge Plan or Barriers: Pt has surgery on kidney Monday 9/15 and will be in recovery for several weeks. Pt to follow up at [facility] for med management and therapy.  Additional comments: N/A  Reason for Continuation of Hospitalization: d/c today Estimated length of stay: d/c today For review of initial/current patient goals, please see plan of care.  Attendees:  Patient:    Family:    Physician: Geoffery Lyons MD 03/30/2013 10:37 AM   Nursing: Velna Hatchet RN 03/30/2013 10:37 AM   Clinical Social Worker Lourdez Mcgahan Smart, LCSWA  03/30/2013 10:37 AM  Other: Aggie PA  03/30/2013 10:37 AM   Other: Darden Dates Nurse CM 03/30/2013 10:37 AM   Other: Massie Kluver, Community Care Coordinator  03/30/2013 10:37 AM   Other:    Scribe for Treatment Team:  [employer] LCSWA 03/30/2013 10:37 AM

## 2013-04-04 NOTE — Progress Notes (Signed)
Patient Discharge Instructions:  After Visit Summary (AVS):   Faxed to:  04/04/13 Discharge Summary Note:   Faxed to:  04/04/13 Psychiatric Admission Assessment Note:   Faxed to:  04/04/13 Suicide Risk Assessment - Discharge Assessment:   Faxed to:  04/04/13 Faxed/Sent to the Next Level Care provider:  04/04/13 Faxed to [facility] @ [phone]  Jerelene Redden, 04/04/2013, 1:28 PM

## 2013-04-18 ENCOUNTER — Encounter (HOSPITAL_COMMUNITY): Payer: Self-pay | Admitting: Emergency Medicine

## 2013-04-18 ENCOUNTER — Emergency Department (HOSPITAL_COMMUNITY): Payer: No Typology Code available for payment source

## 2013-04-18 ENCOUNTER — Emergency Department (HOSPITAL_COMMUNITY)
Admission: EM | Admit: 2013-04-18 | Discharge: 2013-04-18 | Disposition: A | Discharge status: Home / Self Care | Payer: No Typology Code available for payment source | Attending: Emergency Medicine | Admitting: Emergency Medicine

## 2013-04-18 DIAGNOSIS — Z79899 Other long term (current) drug therapy: Secondary | ICD-10-CM | POA: Insufficient documentation

## 2013-04-18 DIAGNOSIS — F319 Bipolar disorder, unspecified: Secondary | ICD-10-CM | POA: Insufficient documentation

## 2013-04-18 DIAGNOSIS — Z9089 Acquired absence of other organs: Secondary | ICD-10-CM | POA: Insufficient documentation

## 2013-04-18 DIAGNOSIS — Z8719 Personal history of other diseases of the digestive system: Secondary | ICD-10-CM | POA: Insufficient documentation

## 2013-04-18 DIAGNOSIS — Z85528 Personal history of other malignant neoplasm of kidney: Secondary | ICD-10-CM | POA: Insufficient documentation

## 2013-04-18 DIAGNOSIS — R0600 Dyspnea, unspecified: Secondary | ICD-10-CM

## 2013-04-18 DIAGNOSIS — R0989 Other specified symptoms and signs involving the circulatory and respiratory systems: Secondary | ICD-10-CM | POA: Insufficient documentation

## 2013-04-18 DIAGNOSIS — R109 Unspecified abdominal pain: Secondary | ICD-10-CM | POA: Insufficient documentation

## 2013-04-18 DIAGNOSIS — F209 Schizophrenia, unspecified: Secondary | ICD-10-CM | POA: Insufficient documentation

## 2013-04-18 DIAGNOSIS — Z8669 Personal history of other diseases of the nervous system and sense organs: Secondary | ICD-10-CM | POA: Insufficient documentation

## 2013-04-18 DIAGNOSIS — I2510 Atherosclerotic heart disease of native coronary artery without angina pectoris: Secondary | ICD-10-CM | POA: Insufficient documentation

## 2013-04-18 DIAGNOSIS — R0609 Other forms of dyspnea: Secondary | ICD-10-CM | POA: Insufficient documentation

## 2013-04-18 DIAGNOSIS — E7800 Pure hypercholesterolemia, unspecified: Secondary | ICD-10-CM | POA: Insufficient documentation

## 2013-04-18 DIAGNOSIS — I10 Essential (primary) hypertension: Secondary | ICD-10-CM | POA: Insufficient documentation

## 2013-04-18 DIAGNOSIS — Z880 Allergy status to penicillin: Secondary | ICD-10-CM | POA: Insufficient documentation

## 2013-04-18 DIAGNOSIS — F17200 Nicotine dependence, unspecified, uncomplicated: Secondary | ICD-10-CM | POA: Insufficient documentation

## 2013-04-18 LAB — CBC WITH DIFFERENTIAL/PLATELET
Basophils Absolute: 0 10*3/uL (ref 0.0–0.1)
HCT: 42.1 % (ref 39.0–52.0)
Hemoglobin: 14.8 g/dL (ref 13.0–17.0)
Lymphocytes Relative: 37 % (ref 12–46)
Monocytes Absolute: 0.4 10*3/uL (ref 0.1–1.0)
Monocytes Relative: 6 % (ref 3–12)
Neutro Abs: 2.9 10*3/uL (ref 1.7–7.7)
Neutrophils Relative %: 52 % (ref 43–77)
RDW: 14.1 % (ref 11.5–15.5)
WBC: 5.5 10*3/uL (ref 4.0–10.5)

## 2013-04-18 LAB — COMPREHENSIVE METABOLIC PANEL
AST: 32 U/L (ref 0–37)
Albumin: 3.2 g/dL — ABNORMAL LOW (ref 3.5–5.2)
Alkaline Phosphatase: 48 U/L (ref 39–117)
CO2: 24 mEq/L (ref 19–32)
Chloride: 98 mEq/L (ref 96–112)
Creatinine, Ser: 0.96 mg/dL (ref 0.50–1.35)
GFR calc non Af Amer: >90 mL/min (ref >90)
Potassium: 3.9 mEq/L (ref 3.5–5.1)
Total Bilirubin: 0.2 mg/dL — ABNORMAL LOW (ref 0.3–1.2)

## 2013-04-18 MED ORDER — HYDROCODONE-ACETAMINOPHEN 5-325 MG PO TABS
1.0000 | ORAL_TABLET | Freq: Once | ORAL | Status: AC
  Administered 2013-04-18: 1 via ORAL
  Filled 2013-04-18: qty 1

## 2013-04-18 NOTE — ED Notes (Signed)
Per EMS: Seen at [facility] 10 days ago and had kidney removed due to cancer, c/o pain in navel, stopped taking pain med,hydrocodone, c/o itching with pain medicine, supposed to see DR on 10/7. Complaints of SOB, Lung sounds clear, VSS

## 2013-04-18 NOTE — ED Notes (Signed)
EKG given to EDP, Lockwood,MD.

## 2013-04-18 NOTE — ED Provider Notes (Signed)
CSN: [id number]     Arrival date & time 04/18/13  2038 History   First MD Initiated Contact with Patient 04/18/13 2041     Chief Complaint  Patient presents with  . Shortness of Breath   (Consider location/radiation/quality/duration/timing/severity/associated sxs/prior Treatment) HPI Patient presents with concern of mild dyspnea, mild abdominal pain. Patient had nephrectomy performed several days ago at [facility] due to previously diagnosed renal malignancy. He states that following the surgery he was generally well, and has had no fevers, no passing out, no chest pain, no vomiting, no diarrhea. Patient has had mild dyspnea, eructation about the surgical site. Patient stopped taking his prescribed medication secondary to pruritus. He denies confusion, disorientation, or any other complaints currently.   Past Medical History  Diagnosis Date  . Seizures   . Hypertension   . Depression   . Pancreatitis   . Liver cirrhosis   . Coronary artery disease   . Angina   . Shortness of breath   . Headache(784.0)   . Peripheral vascular disease April 2011    Left Pop  . Hypercholesteremia   . Schizophrenia   . Bipolar 1 disorder   . Cancer of kidney    Past Surgical History  Procedure Laterality Date  . Chest surgery    . Left leg surgery    . Mastectomy     Family History  Problem Relation Age of Onset  . Stroke Other    History  Substance Use Topics  . Smoking status: Current Every Day Smoker -- 0.10 packs/day for 4 years    Types: Cigarettes  . Smokeless tobacco: Never Used  . Alcohol Use: 4.8 oz/week    6 Glasses of wine, 2 Cans of beer per week     Comment: 2-3 bottles wine/2 40 oz beers daily    Review of Systems  All other systems reviewed and are negative.    Allergies  Depakote; Morphine; Penicillins; and Hydrocodone-acetaminophen  Home Medications   Current Outpatient Rx  Name  Route  Sig  Dispense  Refill  . amLODipine (NORVASC) 5 MG tablet   Oral   Take  1 tablet (5 mg total) by mouth daily. For hypertension management         . ARIPiprazole (ABILIFY) 2 MG tablet   Oral   Take 2 tablets (4 mg total) by mouth at bedtime. For mood control   60 tablet   0   . DULoxetine (CYMBALTA) 30 MG capsule   Oral   Take 1 capsule (30 mg total) by mouth daily. For depression   30 capsule   0   . metoprolol tartrate (LOPRESSOR) 25 MG tablet   Oral   Take 1 tablet (25 mg total) by mouth 2 (two) times daily. For high blood pressure control/management         . simvastatin (ZOCOR) 40 MG tablet   Oral   Take 1 tablet (40 mg total) by mouth every evening. For high cholesterol control   30 tablet      . traZODone (DESYREL) 100 MG tablet   Oral   Take 200 mg by mouth at bedtime.          BP 114/73  Pulse 85  Temp(Src) 98.5 F (36.9 C) (Oral)  Ht 5\' 6"  (1.676 m)  Wt 160 lb (72.576 kg)  BMI 25.84 kg/m2  SpO2 97% Physical Exam  Nursing note and vitals reviewed. Constitutional: He is oriented to person, place, and time. He appears well-developed. No distress.  HENT:  Head: Normocephalic and atraumatic.  Eyes: Conjunctivae and EOM are normal.  Cardiovascular: Normal rate and regular rhythm.   Pulmonary/Chest: Effort normal. No stridor. No respiratory distress.  Abdominal: He exhibits no distension.    Abdomen is non-peritoneal with no rebound, no guarding.  Musculoskeletal: He exhibits no edema.  Neurological: He is alert and oriented to person, place, and time.  Skin: Skin is warm and dry.  Psychiatric: He has a normal mood and affect.    ED Course  Procedures (including critical care time) Labs Review Labs Reviewed  COMPREHENSIVE METABOLIC PANEL - Abnormal; Notable for the following:    Sodium 133 (*)    Albumin 3.2 (*)    Total Bilirubin 0.2 (*)    All other components within normal limits  CBC WITH DIFFERENTIAL   Imaging Review Dg Chest 2 View  04/18/2013   CLINICAL DATA:  Chest pain and shortness of breath; fever;  history of breast carcinoma  EXAM: CHEST  2 VIEW  COMPARISON:  March 15, 2013  FINDINGS: There are surgical clips over both breasts with apparent mastectomies bilaterally. The lungs are clear. Heart size and pulmonary vascularity are normal. No adenopathy. No bone lesions. No pneumothorax. .  IMPRESSION: Apparent previous mastectomies bilaterally. Lungs clear. No adenopathy.   Electronically Signed   By: Bretta Bang   On: 04/18/2013 21:33   pulse oximetry is 100% room air normal Cardiac 80 sr, nml  ecg w SR, rate 84 - nrml    MDM   1. Dyspnea    Patient presents with several complaints, several days after having a nephrectomy performed electively for renal carcinoma. On exam he is awake and alert, with no fever, normal vital signs, no peritoneal abdomen.  Patient does have multiple medical problems, but that evaluation was largely reassuring, and there is low suspicion for occult infection, atypical ACS, or other acute systemic illness.  With these reassuring findings, the patient is appropriate for discharge with close outpatient followup, which the patient is able to obtain.    Gerhard Munch, MD 04/18/13 2252

## 2013-04-18 NOTE — ED Notes (Signed)
Bed: ZO10 Expected date:  Expected time:  Means of arrival:  Comments: 31yo post nephrectomy w/ pain

## 2013-04-24 DIAGNOSIS — C649 Malignant neoplasm of unspecified kidney, except renal pelvis: Secondary | ICD-10-CM | POA: Insufficient documentation

## 2013-04-27 ENCOUNTER — Emergency Department (HOSPITAL_COMMUNITY): Payer: No Typology Code available for payment source

## 2013-04-27 ENCOUNTER — Encounter (HOSPITAL_COMMUNITY): Payer: Self-pay | Admitting: Emergency Medicine

## 2013-04-27 ENCOUNTER — Inpatient Hospital Stay (HOSPITAL_COMMUNITY)
Admission: EM | Admit: 2013-04-27 | Discharge: 2013-04-29 | DRG: 392 | Disposition: A | Discharge status: Home / Self Care | Payer: Self-pay | Attending: Internal Medicine | Admitting: Internal Medicine

## 2013-04-27 DIAGNOSIS — R569 Unspecified convulsions: Secondary | ICD-10-CM | POA: Diagnosis present

## 2013-04-27 DIAGNOSIS — F1910 Other psychoactive substance abuse, uncomplicated: Secondary | ICD-10-CM | POA: Diagnosis present

## 2013-04-27 DIAGNOSIS — F17200 Nicotine dependence, unspecified, uncomplicated: Secondary | ICD-10-CM | POA: Diagnosis present

## 2013-04-27 DIAGNOSIS — I10 Essential (primary) hypertension: Secondary | ICD-10-CM | POA: Diagnosis present

## 2013-04-27 DIAGNOSIS — N390 Urinary tract infection, site not specified: Secondary | ICD-10-CM | POA: Diagnosis present

## 2013-04-27 DIAGNOSIS — F1011 Alcohol abuse, in remission: Secondary | ICD-10-CM | POA: Diagnosis present

## 2013-04-27 DIAGNOSIS — D72829 Elevated white blood cell count, unspecified: Secondary | ICD-10-CM

## 2013-04-27 DIAGNOSIS — F319 Bipolar disorder, unspecified: Secondary | ICD-10-CM | POA: Diagnosis present

## 2013-04-27 DIAGNOSIS — F1021 Alcohol dependence, in remission: Secondary | ICD-10-CM

## 2013-04-27 DIAGNOSIS — E872 Acidosis, unspecified: Secondary | ICD-10-CM

## 2013-04-27 DIAGNOSIS — R1115 Cyclical vomiting syndrome unrelated to migraine: Secondary | ICD-10-CM

## 2013-04-27 DIAGNOSIS — Z85528 Personal history of other malignant neoplasm of kidney: Secondary | ICD-10-CM

## 2013-04-27 DIAGNOSIS — Z853 Personal history of malignant neoplasm of breast: Secondary | ICD-10-CM

## 2013-04-27 DIAGNOSIS — R112 Nausea with vomiting, unspecified: Secondary | ICD-10-CM | POA: Diagnosis present

## 2013-04-27 DIAGNOSIS — R109 Unspecified abdominal pain: Secondary | ICD-10-CM | POA: Diagnosis present

## 2013-04-27 DIAGNOSIS — Z9010 Acquired absence of unspecified breast and nipple: Secondary | ICD-10-CM

## 2013-04-27 DIAGNOSIS — F1410 Cocaine abuse, uncomplicated: Secondary | ICD-10-CM | POA: Diagnosis present

## 2013-04-27 DIAGNOSIS — Z885 Allergy status to narcotic agent status: Secondary | ICD-10-CM

## 2013-04-27 DIAGNOSIS — Z905 Acquired absence of kidney: Secondary | ICD-10-CM

## 2013-04-27 DIAGNOSIS — R197 Diarrhea, unspecified: Principal | ICD-10-CM

## 2013-04-27 DIAGNOSIS — D4959 Neoplasm of unspecified behavior of other genitourinary organ: Secondary | ICD-10-CM | POA: Diagnosis present

## 2013-04-27 LAB — URINE MICROSCOPIC-ADD ON

## 2013-04-27 LAB — URINALYSIS, ROUTINE W REFLEX MICROSCOPIC
Glucose, UA: NEGATIVE mg/dL
Protein, ur: NEGATIVE mg/dL
pH: 5.5 (ref 5.0–8.0)

## 2013-04-27 LAB — COMPREHENSIVE METABOLIC PANEL
ALT: 21 U/L (ref 0–53)
AST: 32 U/L (ref 0–37)
Albumin: 4.2 g/dL (ref 3.5–5.2)
BUN: 8 mg/dL (ref 6–23)
Calcium: 9.8 mg/dL (ref 8.4–10.5)
Chloride: 103 mEq/L (ref 96–112)
Creatinine, Ser: 0.85 mg/dL (ref 0.50–1.35)
GFR calc non Af Amer: >90 mL/min (ref >90)
Sodium: 138 mEq/L (ref 135–145)
Total Bilirubin: 0.3 mg/dL (ref 0.3–1.2)

## 2013-04-27 LAB — CBC WITH DIFFERENTIAL/PLATELET
Basophils Absolute: 0 10*3/uL (ref 0.0–0.1)
Basophils Relative: 0 % (ref 0–1)
Eosinophils Absolute: 0 10*3/uL (ref 0.0–0.7)
Eosinophils Relative: 0 % (ref 0–5)
Lymphs Abs: 1.7 10*3/uL (ref 0.7–4.0)
MCH: 30.8 pg (ref 26.0–34.0)
MCHC: 35.5 g/dL (ref 30.0–36.0)
MCV: 86.6 fL (ref 78.0–100.0)
Neutrophils Relative %: 84 % — ABNORMAL HIGH (ref 43–77)
Platelets: 247 10*3/uL (ref 150–400)
RBC: 5.23 MIL/uL (ref 4.22–5.81)
RDW: 14.2 % (ref 11.5–15.5)

## 2013-04-27 LAB — RAPID URINE DRUG SCREEN, HOSP PERFORMED
Amphetamines: NOT DETECTED
Barbiturates: NOT DETECTED
Cocaine: POSITIVE — AB
Tetrahydrocannabinol: NOT DETECTED

## 2013-04-27 LAB — CG4 I-STAT (LACTIC ACID): Lactic Acid, Venous: 4.15 mmol/L — ABNORMAL HIGH (ref 0.5–2.2)

## 2013-04-27 LAB — LIPASE, BLOOD: Lipase: 36 U/L (ref 11–59)

## 2013-04-27 MED ORDER — DIPHENHYDRAMINE HCL 25 MG PO CAPS
25.0000 mg | ORAL_CAPSULE | Freq: Four times a day (QID) | ORAL | Status: DC | PRN
Start: 2013-04-27 — End: 2013-04-29
  Administered 2013-04-27: 25 mg via ORAL
  Filled 2013-04-27: qty 1

## 2013-04-27 MED ORDER — ONDANSETRON HCL 4 MG PO TABS: 4.0000 mg | ORAL_TABLET | Freq: Four times a day (QID) | ORAL | Status: DC | PRN

## 2013-04-27 MED ORDER — ONDANSETRON HCL 4 MG/2ML IJ SOLN
4.0000 mg | Freq: Four times a day (QID) | INTRAMUSCULAR | Status: DC | PRN
  Administered 2013-04-27: 4 mg via INTRAVENOUS
  Filled 2013-04-27: qty 2

## 2013-04-27 MED ORDER — FENTANYL CITRATE 0.05 MG/ML IJ SOLN
50.0000 ug | INTRAMUSCULAR | Status: DC | PRN
  Administered 2013-04-27: 50 ug via INTRAVENOUS
  Filled 2013-04-27: qty 2

## 2013-04-27 MED ORDER — ARIPIPRAZOLE 2 MG PO TABS
4.0000 mg | ORAL_TABLET | Freq: Every day | ORAL | Status: DC
  Administered 2013-04-27 – 2013-04-28 (×2): 4 mg via ORAL
  Filled 2013-04-27 (×3): qty 2

## 2013-04-27 MED ORDER — DULOXETINE HCL 30 MG PO CPEP
30.0000 mg | ORAL_CAPSULE | Freq: Every day | ORAL | Status: DC
  Administered 2013-04-27 – 2013-04-29 (×3): 30 mg via ORAL
  Filled 2013-04-27 (×3): qty 1

## 2013-04-27 MED ORDER — IOHEXOL 300 MG/ML  SOLN
100.0000 mL | Freq: Once | INTRAMUSCULAR | Status: AC | PRN
  Administered 2013-04-27: 100 mL via INTRAVENOUS

## 2013-04-27 MED ORDER — SODIUM CHLORIDE 0.9 % IV SOLN: INTRAVENOUS | Status: DC

## 2013-04-27 MED ORDER — DIPHENHYDRAMINE HCL 25 MG PO TABS
25.0000 mg | ORAL_TABLET | Freq: Four times a day (QID) | ORAL | Status: DC | PRN
  Filled 2013-04-27: qty 1

## 2013-04-27 MED ORDER — FENTANYL CITRATE 0.05 MG/ML IJ SOLN
50.0000 ug | Freq: Once | INTRAMUSCULAR | Status: AC
  Administered 2013-04-27: 50 ug via INTRAVENOUS
  Filled 2013-04-27: qty 2

## 2013-04-27 MED ORDER — METRONIDAZOLE IN NACL 5-0.79 MG/ML-% IV SOLN
500.0000 mg | Freq: Three times a day (TID) | INTRAVENOUS | Status: DC
  Administered 2013-04-27 – 2013-04-28 (×3): 500 mg via INTRAVENOUS
  Filled 2013-04-27 (×4): qty 100

## 2013-04-27 MED ORDER — ONDANSETRON HCL 4 MG/2ML IJ SOLN: 4.0000 mg | Freq: Three times a day (TID) | INTRAMUSCULAR | Status: DC | PRN

## 2013-04-27 MED ORDER — THIAMINE HCL 100 MG/ML IJ SOLN
100.0000 mg | Freq: Every day | INTRAMUSCULAR | Status: DC
  Administered 2013-04-27 – 2013-04-28 (×2): 100 mg via INTRAVENOUS
  Filled 2013-04-27 (×3): qty 1

## 2013-04-27 MED ORDER — FENTANYL CITRATE 0.05 MG/ML IJ SOLN
25.0000 ug | INTRAMUSCULAR | Status: DC | PRN
  Administered 2013-04-28: 25 ug via INTRAVENOUS
  Filled 2013-04-27: qty 2

## 2013-04-27 MED ORDER — METOPROLOL TARTRATE 25 MG PO TABS
25.0000 mg | ORAL_TABLET | Freq: Two times a day (BID) | ORAL | Status: DC
  Administered 2013-04-27 – 2013-04-29 (×4): 25 mg via ORAL
  Filled 2013-04-27 (×5): qty 1

## 2013-04-27 MED ORDER — PROMETHAZINE HCL 25 MG/ML IJ SOLN
12.5000 mg | Freq: Once | INTRAMUSCULAR | Status: AC
  Administered 2013-04-27: 12.5 mg via INTRAVENOUS
  Filled 2013-04-27: qty 1

## 2013-04-27 MED ORDER — ACETAMINOPHEN 325 MG PO TABS
650.0000 mg | ORAL_TABLET | Freq: Four times a day (QID) | ORAL | Status: DC | PRN
  Administered 2013-04-28 (×2): 650 mg via ORAL
  Filled 2013-04-27 (×2): qty 2

## 2013-04-27 MED ORDER — FOLIC ACID 5 MG/ML IJ SOLN
1.0000 mg | Freq: Every day | INTRAMUSCULAR | Status: DC
  Administered 2013-04-27 – 2013-04-28 (×2): 1 mg via INTRAVENOUS
  Filled 2013-04-27 (×3): qty 0.2

## 2013-04-27 MED ORDER — METOCLOPRAMIDE HCL 5 MG/ML IJ SOLN
10.0000 mg | Freq: Once | INTRAMUSCULAR | Status: AC
  Administered 2013-04-27: 10 mg via INTRAVENOUS
  Filled 2013-04-27: qty 2

## 2013-04-27 MED ORDER — CIPROFLOXACIN IN D5W 400 MG/200ML IV SOLN
400.0000 mg | Freq: Two times a day (BID) | INTRAVENOUS | Status: DC
  Administered 2013-04-27 – 2013-04-28 (×2): 400 mg via INTRAVENOUS
  Filled 2013-04-27 (×3): qty 200

## 2013-04-27 MED ORDER — SODIUM CHLORIDE 0.9 % IV BOLUS (SEPSIS)
1000.0000 mL | Freq: Once | INTRAVENOUS | Status: AC
  Administered 2013-04-27: 1000 mL via INTRAVENOUS

## 2013-04-27 MED ORDER — SODIUM CHLORIDE 0.9 % IV SOLN
INTRAVENOUS | Status: DC
  Administered 2013-04-27 – 2013-04-28 (×3): via INTRAVENOUS
  Administered 2013-04-28: 125 mL/h via INTRAVENOUS
  Administered 2013-04-29: 04:00:00 via INTRAVENOUS

## 2013-04-27 MED ORDER — PROMETHAZINE HCL 25 MG/ML IJ SOLN
25.0000 mg | Freq: Once | INTRAMUSCULAR | Status: DC
  Filled 2013-04-27: qty 1

## 2013-04-27 MED ORDER — DIPHENHYDRAMINE HCL 50 MG/ML IJ SOLN
12.5000 mg | Freq: Once | INTRAMUSCULAR | Status: AC
  Administered 2013-04-27: 12.5 mg via INTRAVENOUS
  Filled 2013-04-27: qty 1

## 2013-04-27 MED ORDER — ONDANSETRON HCL 4 MG/2ML IJ SOLN
4.0000 mg | Freq: Once | INTRAMUSCULAR | Status: AC
  Administered 2013-04-27: 4 mg via INTRAVENOUS
  Filled 2013-04-27: qty 2

## 2013-04-27 MED ORDER — SODIUM CHLORIDE 0.9 % IV SOLN
Freq: Once | INTRAVENOUS | Status: AC
  Administered 2013-04-27: 14:00:00 via INTRAVENOUS

## 2013-04-27 MED ORDER — SIMVASTATIN 40 MG PO TABS
40.0000 mg | ORAL_TABLET | Freq: Every evening | ORAL | Status: DC
  Administered 2013-04-27 – 2013-04-28 (×2): 40 mg via ORAL
  Filled 2013-04-27 (×3): qty 1

## 2013-04-27 MED ORDER — ACETAMINOPHEN 650 MG RE SUPP: 650.0000 mg | Freq: Four times a day (QID) | RECTAL | Status: DC | PRN

## 2013-04-27 MED ORDER — IOHEXOL 300 MG/ML  SOLN
50.0000 mL | Freq: Once | INTRAMUSCULAR | Status: AC | PRN
  Administered 2013-04-27: 50 mL via ORAL

## 2013-04-27 MED ORDER — TRAZODONE HCL 100 MG PO TABS
200.0000 mg | ORAL_TABLET | Freq: Every day | ORAL | Status: DC
  Administered 2013-04-27 – 2013-04-28 (×2): 200 mg via ORAL
  Filled 2013-04-27 (×3): qty 2

## 2013-04-27 NOTE — ED Notes (Signed)
Pt actively vomiting before PO challenge, PA notified
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
## 2013-04-27 NOTE — ED Provider Notes (Signed)
4:22 PM Handoff from [place] PA-C at shift change -- Per [facility] records: Mr. Eickhoff is a 32 y.o. gentleman who is status post robotic assisted laparoscopic left partial nephrectomy secondary to clear cell renal cell carcinoma, Furhman Grade 2, pathologic stage pT1a, Nx, Mx, R0. Surgical date was 04/04/2013.  Pt presents with abd pain, N/V. Abdominal pain is mainly left-sided and has been there since his surgery. Nausea and vomiting were worse this morning. He has also had some dysuria since the surgery.   5:15 PM CT was reviewed by myself. Patient informed of results. He is feeling improved. Pain is improved but not gone. He has not vomited or felt sick since drinking his oral contrast. He has done well with fentanyl and reglan here.   Will redose pain medication, have patient drink. If continues to be improved, will likely discharge to home with followup with his doctor.   Exam:  Gen NAD; Heart RRR, nml S1,S2, no m/r/g; Lungs CTAB; Abd soft, lert-sided tenderness and healing port scars, no rebound or guarding; Ext 2+ pedal pulses bilaterally, no edema.  5:55 PM Patient has resumed vomiting, a large amount of oral contrast. Will admit for intractable N/V. Do not feel he needs admission to [facility] as CT reassuring, kidney fcn normal, he appears well. Additional antiemetics ordered. He has received zofran, phenergan, reglan.   Renne Crigler, PA-C 04/28/13 [phone]

## 2013-04-27 NOTE — ED Notes (Signed)
Called to give report nurse uanvaialble will call back.

## 2013-04-27 NOTE — H&P (Signed)
[facility] History and Physical  [name] ZOX:[id number] DOB: May 19, 1981 DOA: 04/27/2013  Referring physician:Dr Pickerin PCP: Dartha Lodge, FNP  Specialists: none  Chief Complaint: Nausea, vomiting, diarrhea.   HPI: [name] is a 32 y.o. male with PMH significant for hypertension, seizure, bipolar, renal cell cancer S/P partial nephrectomy 03-2013 at [facility] who presents complaining of nausea, vomiting diarrhea that started the morning of admission. He has not been feeling well for last few days. He saw his dr at [facility] and they order urine test. They were suppose to call him today. He presents to ED today with nausea, and vomiting. He has vomit more than 8 times, dark, black yellow color. No bright red blood on it. He relates abdominal pain, mid-abdome, ever since his surgery. Pain is worse after frequent vomiting.   Review of Systems: Negative except asper HPI.   Past Medical History  Diagnosis Date  . Seizures   . Hypertension   . Depression   . Pancreatitis   . Liver cirrhosis   . Coronary artery disease   . Angina   . Shortness of breath   . Headache(784.0)   . Peripheral vascular disease April 2011    Left Pop  . Hypercholesteremia   . Schizophrenia   . Bipolar 1 disorder   . Cancer of kidney    Past Surgical History  Procedure Laterality Date  . Chest surgery    . Left leg surgery    . Mastectomy     Social History:  reports that he has been smoking Cigarettes.  He has a .4 pack-year smoking history. He has never used smokeless tobacco. He reports that he drinks about 4.8 ounces of alcohol per week. He reports that he uses illicit drugs ("Crack" cocaine) about once per week.   Allergies  Allergen Reactions  . Depakote [Divalproex Sodium] Other (See Comments)    "Bug out and hallucinate"  . Morphine Itching  . Penicillins Swelling  . Hydrocodone-Acetaminophen Itching and Rash    Family History  Problem Relation Age of Onset  . Stroke  Other     Prior to Admission medications   Medication Sig Start Date End Date Taking? Authorizing Provider  amLODipine (NORVASC) 5 MG tablet Take 1 tablet (5 mg total) by mouth daily. For hypertension management 03/30/13  Yes Sanjuana Kava, NP  ARIPiprazole (ABILIFY) 2 MG tablet Take 2 tablets (4 mg total) by mouth at bedtime. For mood control 03/30/13  Yes Sanjuana Kava, NP  diphenhydrAMINE (BENADRYL) 25 MG tablet Take 25 mg by mouth every 6 (six) hours as needed for itching.   Yes Historical Provider, MD  DULoxetine (CYMBALTA) 30 MG capsule Take 1 capsule (30 mg total) by mouth daily. For depression 03/30/13  Yes Sanjuana Kava, NP  metoprolol tartrate (LOPRESSOR) 25 MG tablet Take 1 tablet (25 mg total) by mouth 2 (two) times daily. For high blood pressure control/management 03/30/13  Yes Sanjuana Kava, NP  simvastatin (ZOCOR) 40 MG tablet Take 1 tablet (40 mg total) by mouth every evening. For high cholesterol control 03/30/13  Yes Sanjuana Kava, NP  traZODone (DESYREL) 100 MG tablet Take 200 mg by mouth at bedtime.   Yes Historical Provider, MD   Physical Exam: Filed Vitals:   04/27/13 1908  BP: 123/78  Pulse: 84  Temp:   Resp: 16   General Appearance:    Alert, cooperative, no distress, appears stated age  Head:    Normocephalic, without obvious abnormality, atraumatic  Eyes:    PERRL, conjunctiva/corneas clear, EOM's intact,       Ears:    Normal TM's and external ear canals, both ears  Nose:   Nares normal, septum midline, mucosa normal, no drainage    or sinus tenderness  Throat:   Lips, mucosa, and tongue normal; teeth and gums normal  Neck:   Supple, symmetrical, trachea midline, no adenopathy;       thyroid:  No enlargement/tenderness/nodules; no carotid   bruit or JVD  Back:     Symmetric, no curvature, ROM normal, no CVA tenderness  Lungs:     Clear to auscultation bilaterally, respirations unlabored  Chest wall:    No tenderness or deformity  Heart:    Regular rate and  rhythm, S1 and S2 normal, no murmur, rub   or gallop  Abdomen:     Soft, mild mid abdomen and left lower quadrant tenderness, bowel sounds active all four quadrants,    no masses, no organomegaly        Extremities:   Extremities normal, atraumatic, no cyanosis or edema  Pulses:   2+ and symmetric all extremities  Skin:   Skin color, texture, turgor normal, no rashes or lesions  Lymph nodes:   Cervical, supraclavicular, and axillary nodes normal  Neurologic:   CNII-XII intact. Normal strength, sensation and reflexes      throughout      Labs on Admission:  Basic Metabolic Panel:  Recent Labs Lab 04/27/13 1458  NA 138  K 3.8  CL 103  CO2 19  GLUCOSE 147*  BUN 8  CREATININE 0.85  CALCIUM 9.8   Liver Function Tests:  Recent Labs Lab 04/27/13 1458  AST 32  ALT 21  ALKPHOS 51  BILITOT 0.3  PROT 8.0  ALBUMIN 4.2    Recent Labs Lab 04/27/13 1458  LIPASE 36   No results found for this basename: AMMONIA,  in the last 168 hours CBC:  Recent Labs Lab 04/27/13 1458  WBC 13.8*  NEUTROABS 11.5*  HGB 16.1  HCT 45.3  MCV 86.6  PLT 247   Cardiac Enzymes: No results found for this basename: CKTOTAL, CKMB, CKMBINDEX, TROPONINI,  in the last 168 hours  BNP (last 3 results) No results found for this basename: PROBNP,  in the last 8760 hours CBG: No results found for this basename: GLUCAP,  in the last 168 hours  Radiological Exams on Admission: Ct Abdomen Pelvis W Contrast  04/27/2013   CLINICAL DATA:  Recent left partial nephrectomy 04/14/2013 (2 weeks ago). Abdominal pain with nausea and vomiting. Remote history of breast silicone injections.  EXAM: CT ABDOMEN AND PELVIS WITH CONTRAST  TECHNIQUE: Multidetector CT imaging of the abdomen and pelvis was performed using the standard protocol following bolus administration of intravenous contrast.  CONTRAST:  OMNIPAQUE IOHEXOL 300 MG/ML  SOLN  COMPARISON:  Multiple exams, including 04/27/2013 and 03/28/2012   FINDINGS: Stable appearance of scattered subcutaneous nodules and expansion of the subcutaneous tissues anteriorly in the right lower chest. Low-density nodules projects superficial and deep to the upper portion of the left external oblique muscle and superficial to the right external oblique muscle, and small subcutaneous nodules are present along the buttock region bilateral.  The liver, spleen, adrenal glands, and pancreas appear normal.  Partial left nephrectomy noted with low-density/devascularized zone at the partial nephrectomy margin which is not atypical in the early postoperative period. Surrounding stranding is likewise expected. No large capsular or perinephric hematoma observed. Stranding adjacent to  the umbilicus likely represents laparoscopy port.  No dilated bowel observed. No extraluminal gas. No significant ascites. Urinary bladder unremarkable.  IMPRESSION: 1. No significant hemorrhagic complication from partial nephrectomy identified. Small devascularized zone along the resection bed is not unexpected. Surrounding postoperative stranding noted. 2. No bowel obstruction or specific abnormality to explain the patient's symptoms. 3. Nodularity in the anterior lower chest likely or relates to granulomatous response from prior silicone injections. The gluteal nodularity may have a similar etiology.   Electronically Signed   By: Herbie Baltimore M.D.   On: 04/27/2013 17:02   Dg Abd Acute W/chest  04/27/2013   CLINICAL DATA:  32 year old male with abdominal pain and nausea/ vomiting.  EXAM: ACUTE ABDOMEN SERIES (ABDOMEN 2 VIEW & CHEST 1 VIEW)  COMPARISON:  04/18/2013 and prior chest radiographs. 12/23/2012 and prior abdominal radiographs  FINDINGS: The cardiomediastinal silhouette is unremarkable.  Surgical clips overlying the chest are noted.  There is no evidence of focal airspace disease, pulmonary edema, suspicious pulmonary nodule/mass, pleural effusion, or pneumothorax. No acute bony  abnormalities are identified.  The bowel gas pattern is unremarkable.  There is no evidence of dilated bowel loops, bowel obstruction or pneumoperitoneum.  No suspicious calcifications are identified.  The bony structures are unremarkable.  IMPRESSION: No acute abnormalities.   Electronically Signed   By: Laveda Abbe M.D.   On: 04/27/2013 15:48      Assessment/Plan Active Problems:   SEIZURE DISORDER   Leukocytosis   Lactic acidosis   Diarrhea  1-Nausea, vomiting, diarrhea: this could be secondary to gastroenteritis, also in the differential C diff (recent admission to hospital, and probably antibiotics use): Continue with IV fluids, IV zofran. Clear diet , advance as tolerated. I will check for C diff. I will start Flagyl.  2-Abdominal pain: post surgery. Not worse since surgery. CT abdomen which results as follow:  No significant hemorrhagic complication from partial nephrectomy identified. Small devascularized zone along the resection bed is not unexpected. Surrounding postoperative stranding noted. Continue with fentanyl PRN. Patient allergic to oxycodone.   3-Lactic acidosis: Patient does not appears septic. This is probably hypoperfusion. Repeat l;actic acid. Continue with IV fluids. SBP in the 120 range. Follow up urine culture, blood culture.   4-UTI ?: UA with small leukocytes, due to increase lactic acid and leukocytosis. Will start ciprofloxacin. If C diff positive and urine culture negative will need to discontinue antibiotics as soon as possible.   5-Hypertension: Hold Norvasc. continue with metoprolol with holder parameters for SBP less than 110.   6-Seizure: He is not taking any medications for this. 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 His last alcohol drink was 6 months ago. He used to take dilating. His last seizure was 2 years ago, although he think he had a seizure 1 month ago when he pass out in the shower. Will need records from PCP.    7-Bipolar: Continue with Trazodone, cymbalta, abilify.    Code Status: presume full Code.  Family Communication: Care discussed with patient.  Disposition Plan: expect 2 to 3 days inpatient.   Time spent: 65 minutes.   Ambyr Qadri [facility] Pager [phone]  If 7PM-7AM, please contact night-coverage [email or web address] Password [email or web address] 04/27/2013, 7:18 PM

## 2013-04-27 NOTE — ED Provider Notes (Signed)
Date: 04/27/2013  Rate: 80  Rhythm: normal sinus rhythm  QRS Axis: normal  Intervals: normal  ST/T Wave abnormalities: normal  Conduction Disutrbances: none  Narrative Interpretation:   Old EKG Reviewed: No significant changes noted     Trixie Dredge, PA-C 04/27/13 1622

## 2013-04-27 NOTE — Progress Notes (Signed)
Utilization Review completed.  Kynslie Ringle RN CM  

## 2013-04-27 NOTE — ED Notes (Signed)
hospitalist at bedside

## 2013-04-27 NOTE — ED Notes (Signed)
Elevated I stat Lactic -  PA Irving Burton and RN are both aware.

## 2013-04-27 NOTE — ED Notes (Addendum)
Pt reports taking hydrocodone this morning for pain from surgery - states he has been n/v/d, sob and diaphoretic since this morning. Abdominal pain to center of abd. Reports generalized body aches "like the flu"

## 2013-04-27 NOTE — ED Notes (Signed)
Pt was switched from oxycodone to another pain medication and states that he has had n/v pt is a post op kidney removal 13 days ago. zofran 8mg  iv 22g iv rt hand, also states that he has abd pain. Removal was done at [facility] center

## 2013-04-27 NOTE — Progress Notes (Signed)
Attempted to call report from [name], RN.  Nurse stuck in room, will call floor at [phone] when she is able.  [name]

## 2013-04-27 NOTE — Progress Notes (Signed)
[facility] provided pt with a list of primary care resources and a [facility] [employer] application.  °

## 2013-04-27 NOTE — ED Provider Notes (Signed)
CSN: [id number]     Arrival date & time 04/27/13  1308 History   First MD Initiated Contact with Patient 04/27/13 1359     Chief Complaint  Patient presents with  . Nausea  . Emesis   (Consider location/radiation/quality/duration/timing/severity/associated sxs/prior Treatment) HPI  Patient presents with abdominal pain, N/V/D.  Pt has hx renal cancer and states he had 1/2 his kidney removed on 04/14/13, states he had an allergic reaction to the pain medication they gave him on discharge, was seen again 5 days ago and got new medication.  States he took it 4 days ago and it made him a little dizzy.  Took it this morning for the first time and states that since then he has had N/V/D.  States the pain and N/V/D feel like his prior pancreatitis.  Has had chills/sweats when his pain is bad.  Said yesterday he started feeling a little weak.  Also is having pain in his abdomen with urination - this has been ongoing x 1 week and he has spoken with his doctor at [facility] and has urinalysis pending there.     Past Medical History  Diagnosis Date  . Seizures   . Hypertension   . Depression   . Pancreatitis   . Liver cirrhosis   . Coronary artery disease   . Angina   . Shortness of breath   . Headache(784.0)   . Peripheral vascular disease April 2011    Left Pop  . Hypercholesteremia   . Schizophrenia   . Bipolar 1 disorder   . Cancer of kidney    Past Surgical History  Procedure Laterality Date  . Chest surgery    . Left leg surgery    . Mastectomy     Family History  Problem Relation Age of Onset  . Stroke Other    History  Substance Use Topics  . Smoking status: Current Every Day Smoker -- 0.10 packs/day for 4 years    Types: Cigarettes  . Smokeless tobacco: Never Used  . Alcohol Use: 4.8 oz/week    6 Glasses of wine, 2 Cans of beer per week     Comment: 2-3 bottles wine/2 40 oz beers daily    Review of Systems  Respiratory: Positive for cough. Negative for shortness of breath.    Cardiovascular: Negative for chest pain.  Gastrointestinal: Positive for nausea, vomiting, abdominal pain, diarrhea and blood in stool.  Genitourinary: Negative for dysuria, urgency and frequency.    Allergies  Depakote; Morphine; Penicillins; and Hydrocodone-acetaminophen  Home Medications   Current Outpatient Rx  Name  Route  Sig  Dispense  Refill  . amLODipine (NORVASC) 5 MG tablet   Oral   Take 1 tablet (5 mg total) by mouth daily. For hypertension management         . ARIPiprazole (ABILIFY) 2 MG tablet   Oral   Take 2 tablets (4 mg total) by mouth at bedtime. For mood control   60 tablet   0   . diphenhydrAMINE (BENADRYL) 25 MG tablet   Oral   Take 25 mg by mouth every 6 (six) hours as needed for itching.         . DULoxetine (CYMBALTA) 30 MG capsule   Oral   Take 1 capsule (30 mg total) by mouth daily. For depression   30 capsule   0   . metoprolol tartrate (LOPRESSOR) 25 MG tablet   Oral   Take 1 tablet (25 mg total) by mouth 2 (two)  times daily. For high blood pressure control/management         . simvastatin (ZOCOR) 40 MG tablet   Oral   Take 1 tablet (40 mg total) by mouth every evening. For high cholesterol control   30 tablet      . traZODone (DESYREL) 100 MG tablet   Oral   Take 200 mg by mouth at bedtime.          BP 127/86  Pulse 88  Temp(Src) 97.5 F (36.4 C) (Oral)  Resp 14  SpO2 98% Physical Exam  Nursing note and vitals reviewed. Constitutional: He appears well-developed and well-nourished. No distress.  HENT:  Head: Normocephalic and atraumatic.  Neck: Neck supple.  Cardiovascular: Normal rate and regular rhythm.   Pulmonary/Chest: Effort normal and breath sounds normal. No respiratory distress. He has no wheezes. He has no rales.  Abdominal: Soft. He exhibits no distension. There is tenderness. There is rebound and guarding.  laparoscopic surgery incisions healing well - no erythema, edema, warmth, discharge.   Neurological: He is alert. He exhibits normal muscle tone.  Skin: He is not diaphoretic.    ED Course  Procedures (including critical care time) Labs Review Labs Reviewed  CBC WITH DIFFERENTIAL - Abnormal; Notable for the following:    WBC 13.8 (*)    Neutrophils Relative % 84 (*)    Neutro Abs 11.5 (*)    All other components within normal limits  COMPREHENSIVE METABOLIC PANEL - Abnormal; Notable for the following:    Glucose, Bld 147 (*)    All other components within normal limits  URINALYSIS, ROUTINE W REFLEX MICROSCOPIC - Abnormal; Notable for the following:    APPearance CLOUDY (*)    Hgb urine dipstick SMALL (*)    Leukocytes, UA SMALL (*)    All other components within normal limits  URINE MICROSCOPIC-ADD ON - Abnormal; Notable for the following:    Squamous Epithelial / LPF FEW (*)    Casts HYALINE CASTS (*)    All other components within normal limits  CG4 I-STAT (LACTIC ACID) - Abnormal; Notable for the following:    Lactic Acid, Venous 4.15 (*)    All other components within normal limits  CULTURE, BLOOD (ROUTINE X 2)  CULTURE, BLOOD (ROUTINE X 2)  LIPASE, BLOOD   Imaging Review Dg Abd Acute W/chest  04/27/2013   CLINICAL DATA:  32 year old male with abdominal pain and nausea/ vomiting.  EXAM: ACUTE ABDOMEN SERIES (ABDOMEN 2 VIEW & CHEST 1 VIEW)  COMPARISON:  04/18/2013 and prior chest radiographs. 12/23/2012 and prior abdominal radiographs  FINDINGS: The cardiomediastinal silhouette is unremarkable.  Surgical clips overlying the chest are noted.  There is no evidence of focal airspace disease, pulmonary edema, suspicious pulmonary nodule/mass, pleural effusion, or pneumothorax. No acute bony abnormalities are identified.  The bowel gas pattern is unremarkable.  There is no evidence of dilated bowel loops, bowel obstruction or pneumoperitoneum.  No suspicious calcifications are identified.  The bony structures are unremarkable.  IMPRESSION: No acute abnormalities.    Electronically Signed   By: Laveda Abbe M.D.   On: 04/27/2013 15:48    EKG Interpretation   None      3:34 PM Discussed pt with Dr Loretha Stapler.  MDM  No diagnosis found.   Pt with hx kidney malignancy s/p resection 04/14/13, now with abdominal pain, N/V/D.  Abdominal pain is central per patient, diffuse on exam.  Pt also has hx pancreatitis, states this feels like his pancreatitis. Pt also developed  these symptoms after taking his new pain medication and has had difficulty tolerating pain medications.  WBC count and lactic acid are elevated.  Pt's pain and nausea controlled in ED.  Abdominal tenderness is diffuse.  DDX: postoperative infection, pancreatitis, gastroenteritis, reaction to pain medication at home.    Pt discussed with Dr Rubin Payor and Rhea Bleacher, PA-C, who assumes care at change of shift pending CT abd/pelvis.     Kemp, PA-C 04/27/13 [phone]

## 2013-04-28 DIAGNOSIS — Z905 Acquired absence of kidney: Secondary | ICD-10-CM

## 2013-04-28 LAB — CBC
Hemoglobin: 12.9 g/dL — ABNORMAL LOW (ref 13.0–17.0)
MCH: 31.7 pg (ref 26.0–34.0)
MCV: 88.5 fL (ref 78.0–100.0)
Platelets: 204 10*3/uL (ref 150–400)
RBC: 4.07 MIL/uL — ABNORMAL LOW (ref 4.22–5.81)
WBC: 8.3 10*3/uL (ref 4.0–10.5)

## 2013-04-28 LAB — BASIC METABOLIC PANEL
BUN: 9 mg/dL (ref 6–23)
CO2: 25 mEq/L (ref 19–32)
Calcium: 8.6 mg/dL (ref 8.4–10.5)
Chloride: 104 mEq/L (ref 96–112)
Creatinine, Ser: 0.92 mg/dL (ref 0.50–1.35)
Glucose, Bld: 125 mg/dL — ABNORMAL HIGH (ref 70–99)
Potassium: 3.2 mEq/L — ABNORMAL LOW (ref 3.5–5.1)

## 2013-04-28 LAB — CLOSTRIDIUM DIFFICILE BY PCR: Toxigenic C. Difficile by PCR: NEGATIVE

## 2013-04-28 MED ORDER — METRONIDAZOLE 500 MG PO TABS
500.0000 mg | ORAL_TABLET | Freq: Three times a day (TID) | ORAL | Status: DC
  Administered 2013-04-28 – 2013-04-29 (×2): 500 mg via ORAL
  Filled 2013-04-28 (×6): qty 1

## 2013-04-28 MED ORDER — POTASSIUM CHLORIDE CRYS ER 20 MEQ PO TBCR
40.0000 meq | EXTENDED_RELEASE_TABLET | Freq: Two times a day (BID) | ORAL | Status: AC
  Administered 2013-04-28 (×2): 40 meq via ORAL
  Filled 2013-04-28 (×2): qty 2

## 2013-04-28 MED ORDER — CIPROFLOXACIN HCL 500 MG PO TABS
500.0000 mg | ORAL_TABLET | Freq: Two times a day (BID) | ORAL | Status: DC
  Administered 2013-04-28 – 2013-04-29 (×3): 500 mg via ORAL
  Filled 2013-04-28 (×5): qty 1

## 2013-04-28 NOTE — ED Provider Notes (Signed)
Medical screening examination/treatment/procedure(s) were conducted as a shared visit with non-physician practitioner(s) and myself.  I personally evaluated the patient during the encounter.   Please see my separate note.     Candyce Churn, MD 04/28/13 [phone]

## 2013-04-28 NOTE — ED Provider Notes (Signed)
Medical screening examination/treatment/procedure(s) were conducted as a shared visit with non-physician practitioner(s) and myself.  I personally evaluated the patient during the encounter.   Please see my separate note.     Abimael Zeiter [name] Belynda Pagaduan, MD 04/28/13 1503 

## 2013-04-28 NOTE — Progress Notes (Signed)
[facility] PROGRESS NOTE  [name] WGN:[id number] DOB: 1981/06/09 DOA: 04/27/2013 PCP: Dartha Lodge, FNP  HPI: [name] is a 32 y.o. male with PMH significant for hypertension, seizure, bipolar, renal cell cancer S/P partial nephrectomy 03-2013 at [facility] who presents complaining of nausea, vomiting diarrhea that started the morning of admission. He has not been feeling well for last few days. He saw his dr at [facility] and they order urine test. They were suppose to call him today. He presents to ED today with nausea, and vomiting. He has vomit more than 8 times, dark, black yellow color. No bright red blood on it. He relates abdominal pain, mid-abdome, ever since his surgery. Pain is worse after frequent vomiting.   Assessment/Plan: Nausea, vomiting, diarrhea: this could be secondary to gastroenteritis, also in the differential C diff  - C diff pending. - improving this morning with antibiotics, will switch to po. Eating. Nausea/vomiting has resolved.  Abdominal pain: post surgery.  - resolved.   UTI: UA with small leukocytes, due to increase lactic acid and leukocytosis.  - continue Cipro. Patient did have UTI symptoms for few days.  Hypertension: Hold Norvasc. continue with metoprolol with holder parameters for SBP less than 110.  - BP good this morning Seizure: He is not taking any medications for this. 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 No longer abusing ETOH Cocaine use - denies, states is now clean and hasn't used any in ~ 2 weeks, however UDS positive. Bipolar: Continue with Trazodone, cymbalta, abilify.    Code Status: Full Family Communication: none  Disposition Plan: home when ready, likely tomorrow  Consultants:  none  Procedures:  none  Anti-infectives   Start     Dose/Rate Route Frequency Ordered Stop   04/27/13 1930  ciprofloxacin (CIPRO) IVPB 400 mg     400 mg 200 mL/hr over 60 Minutes Intravenous Every 12  hours 04/27/13 1928     04/27/13 1915  metroNIDAZOLE (FLAGYL) IVPB 500 mg     500 mg 100 mL/hr over 60 Minutes Intravenous Every 8 hours 04/27/13 1909       Antibiotics Given (last 72 hours)   Date/Time Action Medication Dose Rate   04/27/13 2108 Given   ciprofloxacin (CIPRO) IVPB 400 mg 400 mg 200 mL/hr   04/28/13 0731 Given   ciprofloxacin (CIPRO) IVPB 400 mg 400 mg 200 mL/hr      HPI/Subjective: - feeling better this morning.   Objective: Filed Vitals:   04/27/13 2051 04/27/13 2159 04/28/13 0440 04/28/13 1036  BP: 125/85  106/67 128/72  Pulse: 81  81 92  Temp: 98 F (36.7 C)  98 F (36.7 C)   TempSrc: Oral  Oral   Resp: 18  16   Height:  5\' 6"  (1.676 m)    Weight:  76.204 kg (168 lb)    SpO2: 92%  98%     Intake/Output Summary (Last 24 hours) at 04/28/13 1240 Last data filed at 04/28/13 1000  Gross per 24 hour  Intake 1408.33 ml  Output   1150 ml  Net 258.33 ml   Filed Weights   04/27/13 2159  Weight: 76.204 kg (168 lb)    Exam:   General:  NAD  Cardiovascular: regular rate and rhythm, without MRG  Respiratory: good air movement, clear to auscultation throughout, no wheezing, ronchi or rales  Abdomen: soft, not tender to palpation, positive bowel sounds  MSK: no peripheral edema  Neuro: CN 2-12 grossly intact, MS  5/5 in all 4  Data Reviewed: Basic Metabolic Panel:  Recent Labs Lab 04/27/13 1458 04/28/13 0535  NA 138 137  K 3.8 3.2*  CL 103 104  CO2 19 25  GLUCOSE 147* 125*  BUN 8 9  CREATININE 0.85 0.92  CALCIUM 9.8 8.6   Liver Function Tests:  Recent Labs Lab 04/27/13 1458  AST 32  ALT 21  ALKPHOS 51  BILITOT 0.3  PROT 8.0  ALBUMIN 4.2    Recent Labs Lab 04/27/13 1458  LIPASE 36   No results found for this basename: AMMONIA,  in the last 168 hours CBC:  Recent Labs Lab 04/27/13 1458 04/28/13 0535  WBC 13.8* 8.3  NEUTROABS 11.5*  --   HGB 16.1 12.9*  HCT 45.3 36.0*  MCV 86.6 88.5  PLT 247 204   Studies: Ct  Abdomen Pelvis W Contrast  04/27/2013   CLINICAL DATA:  Recent left partial nephrectomy 04/14/2013 (2 weeks ago). Abdominal pain with nausea and vomiting. Remote history of breast silicone injections.  EXAM: CT ABDOMEN AND PELVIS WITH CONTRAST  TECHNIQUE: Multidetector CT imaging of the abdomen and pelvis was performed using the standard protocol following bolus administration of intravenous contrast.  CONTRAST:  OMNIPAQUE IOHEXOL 300 MG/ML  SOLN  COMPARISON:  Multiple exams, including 04/27/2013 and 03/28/2012  FINDINGS: Stable appearance of scattered subcutaneous nodules and expansion of the subcutaneous tissues anteriorly in the right lower chest. Low-density nodules projects superficial and deep to the upper portion of the left external oblique muscle and superficial to the right external oblique muscle, and small subcutaneous nodules are present along the buttock region bilateral.  The liver, spleen, adrenal glands, and pancreas appear normal.  Partial left nephrectomy noted with low-density/devascularized zone at the partial nephrectomy margin which is not atypical in the early postoperative period. Surrounding stranding is likewise expected. No large capsular or perinephric hematoma observed. Stranding adjacent to the umbilicus likely represents laparoscopy port.  No dilated bowel observed. No extraluminal gas. No significant ascites. Urinary bladder unremarkable.  IMPRESSION: 1. No significant hemorrhagic complication from partial nephrectomy identified. Small devascularized zone along the resection bed is not unexpected. Surrounding postoperative stranding noted. 2. No bowel obstruction or specific abnormality to explain the patient's symptoms. 3. Nodularity in the anterior lower chest likely or relates to granulomatous response from prior silicone injections. The gluteal nodularity may have a similar etiology.   Electronically Signed   By: Herbie Baltimore M.D.   On: 04/27/2013 17:02   Dg Abd  Acute W/chest  04/27/2013   CLINICAL DATA:  32 year old male with abdominal pain and nausea/ vomiting.  EXAM: ACUTE ABDOMEN SERIES (ABDOMEN 2 VIEW & CHEST 1 VIEW)  COMPARISON:  04/18/2013 and prior chest radiographs. 12/23/2012 and prior abdominal radiographs  FINDINGS: The cardiomediastinal silhouette is unremarkable.  Surgical clips overlying the chest are noted.  There is no evidence of focal airspace disease, pulmonary edema, suspicious pulmonary nodule/mass, pleural effusion, or pneumothorax. No acute bony abnormalities are identified.  The bowel gas pattern is unremarkable.  There is no evidence of dilated bowel loops, bowel obstruction or pneumoperitoneum.  No suspicious calcifications are identified.  The bony structures are unremarkable.  IMPRESSION: No acute abnormalities.   Electronically Signed   By: Laveda Abbe M.D.   On: 04/27/2013 15:48    Scheduled Meds: . ARIPiprazole  4 mg Oral QHS  . ciprofloxacin  400 mg Intravenous Q12H  . DULoxetine  30 mg Oral Daily  . folic acid  1 mg Intravenous  Daily  . metoprolol tartrate  25 mg Oral BID  . metronidazole  500 mg Intravenous Q8H  . simvastatin  40 mg Oral QPM  . thiamine  100 mg Intravenous Daily  . traZODone  200 mg Oral QHS   Continuous Infusions: . sodium chloride 125 mL/hr (04/28/13 1152)    Principal Problem:   Diarrhea Active Problems:   SUBSTANCE ABUSE, MULTIPLE   HYPERTENSION   SEIZURE DISORDER   Leukocytosis   Lactic acidosis   S/p nephrectomy  Time spent: 4  Pamella Pert, MD [facility] Pager [phone]. If 7 PM - 7 AM, please contact night-coverage at [email or web address], password [email or web address] 04/28/2013, 12:40 PM  LOS: 1 day

## 2013-04-28 NOTE — ED Provider Notes (Signed)
Medical screening examination/treatment/procedure(s) were conducted as a shared visit with non-physician practitioner(s) and myself.  I personally evaluated the patient during the encounter  32 yo male with abdominal pain, nausea and vomiting.  On exam, nontoxic, abdomen tender in LLQ, without peritoneal signs, normal GU exam with no testicular tenderness or mass.  Lactate elevated, but CT reassuring.  Plan admission for intractable nausea and vomiting.    Clinical Impression: 1. Intractable nausea and vomiting   2. Abdominal pain   3. Diarrhea   4. Lactic acidosis       Candyce Churn, MD 04/28/13 1501

## 2013-04-29 LAB — URINE CULTURE

## 2013-04-29 LAB — POTASSIUM: Potassium: 3.8 mEq/L (ref 3.5–5.1)

## 2013-04-29 MED ORDER — CIPROFLOXACIN HCL 500 MG PO TABS: 500.0000 mg | ORAL_TABLET | Freq: Two times a day (BID) | ORAL | Status: DC

## 2013-04-29 MED ORDER — METRONIDAZOLE 500 MG PO TABS: 500.0000 mg | ORAL_TABLET | Freq: Three times a day (TID) | ORAL | Status: DC

## 2013-04-29 NOTE — Discharge Summary (Signed)
Physician Discharge Summary  Nic Lampe ZOX:[id number] DOB: [birth year] DOA: 04/27/2013  PCP: Dartha Lodge, FNP  Admit date: 04/27/2013 Discharge date: 04/29/2013  Time spent: 35 minutes  Recommendations for Outpatient Follow-up:  1. Follow up with PCP in 1 week   Recommendations for primary care physician for things to follow:  - BMP  Discharge Diagnoses:  Principal Problem:   Diarrhea Active Problems:   SUBSTANCE ABUSE, MULTIPLE   HYPERTENSION   SEIZURE DISORDER   Leukocytosis   Lactic acidosis   S/p nephrectomy  Discharge Condition: stable  Diet recommendation: regular  Filed Weights   04/27/13 2159  Weight: 76.204 kg (168 lb)   History of present illness:  [name] is a 32 y.o. male with PMH significant for hypertension, seizure, bipolar, renal cell cancer S/P partial nephrectomy 03-2013 at [facility] who presents complaining of nausea, vomiting diarrhea that started the morning of admission. He has not been feeling well for last few days. He saw his dr at [facility] and they order urine test. They were suppose to call him today. He presents to ED today with nausea, and vomiting. He has vomit more than 8 times, dark, black yellow color. No bright red blood on it. He relates abdominal pain, mid-abdome, ever since his surgery. Pain is worse after frequent vomiting.   Hospital Course:  Nausea, vomiting, diarrhea: his symptoms have resolved completely as soon as antibiotics were initiated and patient was able to tolerate a diet without nausea or vomiting. He has had a formed stool while hospitalized and C diff was sent and came back negative. Given improvement with antibiotics he was transitioned to oral Metronidazole and Ciprofloxacin to complete 5 additional days as an outpatient and was discharged home in stable condition. He was advised to follow up with his PCP in ~1 week.  UTI: UA with small leukocytes, due to increase lactic acid and leukocytosis. Cultures negative.   Hypertension: continue previous regimen.  Seizure: He is not taking any medications for this. 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 No longer abusing ETOH. Congratulated patient on abstinence.  Cocaine use - denies, states is now clean and hasn't used any in ~ 2 weeks, however UDS positive. Advised to avoid further drug use.  Bipolar: Continue with Trazodone, cymbalta, abilify.   Procedures:  none   Consultations:  none  Discharge Exam: Filed Vitals:   04/28/13 1036 04/28/13 1400 04/28/13 2205 04/29/13 0547  BP: 128/72 108/69 118/78 118/77  Pulse: 92 78 78 78  Temp:  97.6 F (36.4 C) 98.2 F (36.8 C) 98.1 F (36.7 C)  TempSrc:  Oral Oral Oral  Resp:  18 16 16   Height:      Weight:      SpO2:  98% 98% 99%   General: NAD Cardiovascular: RRR Respiratory: CTA biL  Discharge Instructions   Future Appointments Provider Department Dept Phone   01/15/2014 3:00 PM [facility] [place] [facility] [address] [phone]   01/15/2014 3:30 PM [facility] [place] [facility] [address] [phone]   01/15/2014 4:00 PM Larina Earthly, MD [facility] [phone]       Medication List         amLODipine 5 MG tablet  Commonly known as:  NORVASC  Take 1 tablet (5 mg total) by mouth daily. For hypertension management     ARIPiprazole 2 MG tablet  Commonly known as:  ABILIFY  Take 2 tablets (4 mg total) by  mouth at bedtime. For mood control     ciprofloxacin 500 MG tablet  Commonly known as:  CIPRO  Take 1 tablet (500 mg total) by mouth 2 (two) times daily.     diphenhydrAMINE 25 MG tablet  Commonly known as:  BENADRYL  Take 25 mg by mouth every 6 (six) hours as needed for itching.     DULoxetine 30 MG capsule  Commonly known as:  CYMBALTA  Take 1 capsule (30 mg total) by mouth daily. For depression     metoprolol tartrate 25 MG tablet  Commonly known as:  LOPRESSOR  Take 1 tablet (25 mg  total) by mouth 2 (two) times daily. For high blood pressure control/management     metroNIDAZOLE 500 MG tablet  Commonly known as:  FLAGYL  Take 1 tablet (500 mg total) by mouth every 8 (eight) hours.     simvastatin 40 MG tablet  Commonly known as:  ZOCOR  Take 1 tablet (40 mg total) by mouth every evening. For high cholesterol control     traZODone 100 MG tablet  Commonly known as:  DESYREL  Take 200 mg by mouth at bedtime.           Follow-up Information   Follow up with STEELE,ANTHONY, FNP. Schedule an appointment as soon as possible for a visit in 1 week.   Specialty:  Nurse Practitioner      The results of significant diagnostics from this hospitalization (including imaging, microbiology, ancillary and laboratory) are listed below for reference.    Significant Diagnostic Studies: Dg Chest 2 View  04/18/2013   CLINICAL DATA:  Chest pain and shortness of breath; fever; history of breast carcinoma  EXAM: CHEST  2 VIEW  COMPARISON:  March 15, 2013  FINDINGS: There are surgical clips over both breasts with apparent mastectomies bilaterally. The lungs are clear. Heart size and pulmonary vascularity are normal. No adenopathy. No bone lesions. No pneumothorax. .  IMPRESSION: Apparent previous mastectomies bilaterally. Lungs clear. No adenopathy.   Electronically Signed   By: Bretta Bang   On: 04/18/2013 21:33   Ct Abdomen Pelvis W Contrast  04/27/2013   CLINICAL DATA:  Recent left partial nephrectomy 04/14/2013 (2 weeks ago). Abdominal pain with nausea and vomiting. Remote history of breast silicone injections.  EXAM: CT ABDOMEN AND PELVIS WITH CONTRAST  TECHNIQUE: Multidetector CT imaging of the abdomen and pelvis was performed using the standard protocol following bolus administration of intravenous contrast.  CONTRAST:  OMNIPAQUE IOHEXOL 300 MG/ML  SOLN  COMPARISON:  Multiple exams, including 04/27/2013 and 03/28/2012  FINDINGS: Stable appearance of scattered  subcutaneous nodules and expansion of the subcutaneous tissues anteriorly in the right lower chest. Low-density nodules projects superficial and deep to the upper portion of the left external oblique muscle and superficial to the right external oblique muscle, and small subcutaneous nodules are present along the buttock region bilateral.  The liver, spleen, adrenal glands, and pancreas appear normal.  Partial left nephrectomy noted with low-density/devascularized zone at the partial nephrectomy margin which is not atypical in the early postoperative period. Surrounding stranding is likewise expected. No large capsular or perinephric hematoma observed. Stranding adjacent to the umbilicus likely represents laparoscopy port.  No dilated bowel observed. No extraluminal gas. No significant ascites. Urinary bladder unremarkable.  IMPRESSION: 1. No significant hemorrhagic complication from partial nephrectomy identified. Small devascularized zone along the resection bed is not unexpected. Surrounding postoperative stranding noted. 2. No bowel obstruction or specific abnormality to explain the patient's  symptoms. 3. Nodularity in the anterior lower chest likely or relates to granulomatous response from prior silicone injections. The gluteal nodularity may have a similar etiology.   Electronically Signed   By: Herbie Baltimore M.D.   On: 04/27/2013 17:02   Dg Abd Acute W/chest  04/27/2013   CLINICAL DATA:  32 year old male with abdominal pain and nausea/ vomiting.  EXAM: ACUTE ABDOMEN SERIES (ABDOMEN 2 VIEW & CHEST 1 VIEW)  COMPARISON:  04/18/2013 and prior chest radiographs. 12/23/2012 and prior abdominal radiographs  FINDINGS: The cardiomediastinal silhouette is unremarkable.  Surgical clips overlying the chest are noted.  There is no evidence of focal airspace disease, pulmonary edema, suspicious pulmonary nodule/mass, pleural effusion, or pneumothorax. No acute bony abnormalities are identified.  The bowel gas  pattern is unremarkable.  There is no evidence of dilated bowel loops, bowel obstruction or pneumoperitoneum.  No suspicious calcifications are identified.  The bony structures are unremarkable.  IMPRESSION: No acute abnormalities.   Electronically Signed   By: Laveda Abbe M.D.   On: 04/27/2013 15:48    Microbiology: Recent Results (from the past 240 hour(s))  CLOSTRIDIUM DIFFICILE BY PCR     Status: None   Collection Time    04/27/13  2:25 PM      Result Value Range Status   C difficile by pcr NEGATIVE  NEGATIVE Final   Comment: Performed at [facility]  CULTURE, BLOOD (ROUTINE X 2)     Status: None   Collection Time    04/27/13  3:59 PM      Result Value Range Status   Specimen Description BLOOD L HAND   Final   Special Requests NONE BOTTLES DRAWN AEROBIC AND ANAEROBIC 2CC   Final   Culture  Setup Time     Final   Value: 04/27/2013 21:04     Performed at [facility]   Culture     Final   Value:        BLOOD CULTURE RECEIVED NO GROWTH TO DATE CULTURE WILL BE HELD FOR 5 DAYS BEFORE ISSUING A FINAL NEGATIVE REPORT     Performed at [facility]   Report Status PENDING   Incomplete  CULTURE, BLOOD (ROUTINE X 2)     Status: None   Collection Time    04/27/13  4:14 PM      Result Value Range Status   Specimen Description BLOOD L HAND   Final   Special Requests NONE BOTTLES DRAWN AEROBIC ONLY 1CC   Final   Culture  Setup Time     Final   Value: 04/27/2013 21:04     Performed at [facility]   Culture     Final   Value:        BLOOD CULTURE RECEIVED NO GROWTH TO DATE CULTURE WILL BE HELD FOR 5 DAYS BEFORE ISSUING A FINAL NEGATIVE REPORT     Performed at [facility]   Report Status PENDING   Incomplete  URINE CULTURE     Status: None   Collection Time    04/27/13  7:57 PM      Result Value Range Status   Specimen Description URINE, RANDOM   Final   Special Requests NONE   Final   Culture  Setup Time     Final   Value: 04/28/2013 02:10      Performed at [facility] Count     Final   Value: 20,OOO COLONIES/ML  Performed at [facility]     Final   Value: Multiple bacterial morphotypes present, none predominant. Suggest appropriate recollection if clinically indicated.     Performed at [facility]   Report Status 04/29/2013 FINAL   Final     Labs: Basic Metabolic Panel:  Recent Labs Lab 04/27/13 1458 04/28/13 0535 04/29/13 0450  NA 138 137  --   K 3.8 3.2* 3.8  CL 103 104  --   CO2 19 25  --   GLUCOSE 147* 125*  --   BUN 8 9  --   CREATININE 0.85 0.92  --   CALCIUM 9.8 8.6  --    Liver Function Tests:  Recent Labs Lab 04/27/13 1458  AST 32  ALT 21  ALKPHOS 51  BILITOT 0.3  PROT 8.0  ALBUMIN 4.2    Recent Labs Lab 04/27/13 1458  LIPASE 36   CBC:  Recent Labs Lab 04/27/13 1458 04/28/13 0535  WBC 13.8* 8.3  NEUTROABS 11.5*  --   HGB 16.1 12.9*  HCT 45.3 36.0*  MCV 86.6 88.5  PLT 247 204    Signed:  GHERGHE, COSTIN  [facility] 04/29/2013, 7:29 AM

## 2013-04-29 NOTE — ED Provider Notes (Signed)
Medical screening examination/treatment/procedure(s) were performed by non-physician practitioner and as supervising physician I was immediately available for consultation/collaboration.  Juliet Rude. Rubin Payor, MD 04/29/13 2336

## 2013-04-29 NOTE — Progress Notes (Signed)
Pt voices understanding of when to call md. Voices understanding of taking entire script of antibiotics, even if feels better. All discharge instructions discussed with pt with adequate time for questions. Discharged via wc with family

## 2013-05-03 LAB — CULTURE, BLOOD (ROUTINE X 2)
Culture: NO GROWTH
Culture: NO GROWTH

## 2013-06-16 ENCOUNTER — Emergency Department (HOSPITAL_COMMUNITY)
Admission: EM | Admit: 2013-06-16 | Discharge: 2013-06-17 | Disposition: A | Discharge status: Home / Self Care | Payer: No Typology Code available for payment source | Attending: Emergency Medicine | Admitting: Emergency Medicine

## 2013-06-16 ENCOUNTER — Encounter (HOSPITAL_COMMUNITY): Payer: Self-pay | Admitting: Emergency Medicine

## 2013-06-16 DIAGNOSIS — I2510 Atherosclerotic heart disease of native coronary artery without angina pectoris: Secondary | ICD-10-CM | POA: Insufficient documentation

## 2013-06-16 DIAGNOSIS — Z85528 Personal history of other malignant neoplasm of kidney: Secondary | ICD-10-CM | POA: Insufficient documentation

## 2013-06-16 DIAGNOSIS — E7800 Pure hypercholesterolemia, unspecified: Secondary | ICD-10-CM | POA: Insufficient documentation

## 2013-06-16 DIAGNOSIS — I739 Peripheral vascular disease, unspecified: Secondary | ICD-10-CM | POA: Insufficient documentation

## 2013-06-16 DIAGNOSIS — R109 Unspecified abdominal pain: Secondary | ICD-10-CM | POA: Insufficient documentation

## 2013-06-16 DIAGNOSIS — F209 Schizophrenia, unspecified: Secondary | ICD-10-CM | POA: Insufficient documentation

## 2013-06-16 DIAGNOSIS — R112 Nausea with vomiting, unspecified: Secondary | ICD-10-CM | POA: Insufficient documentation

## 2013-06-16 DIAGNOSIS — G40909 Epilepsy, unspecified, not intractable, without status epilepticus: Secondary | ICD-10-CM | POA: Insufficient documentation

## 2013-06-16 DIAGNOSIS — F17200 Nicotine dependence, unspecified, uncomplicated: Secondary | ICD-10-CM | POA: Insufficient documentation

## 2013-06-16 DIAGNOSIS — Z880 Allergy status to penicillin: Secondary | ICD-10-CM | POA: Insufficient documentation

## 2013-06-16 DIAGNOSIS — I10 Essential (primary) hypertension: Secondary | ICD-10-CM | POA: Insufficient documentation

## 2013-06-16 DIAGNOSIS — R197 Diarrhea, unspecified: Secondary | ICD-10-CM | POA: Insufficient documentation

## 2013-06-16 DIAGNOSIS — Z8719 Personal history of other diseases of the digestive system: Secondary | ICD-10-CM | POA: Insufficient documentation

## 2013-06-16 DIAGNOSIS — Z79899 Other long term (current) drug therapy: Secondary | ICD-10-CM | POA: Insufficient documentation

## 2013-06-16 DIAGNOSIS — F319 Bipolar disorder, unspecified: Secondary | ICD-10-CM | POA: Insufficient documentation

## 2013-06-16 DIAGNOSIS — R319 Hematuria, unspecified: Secondary | ICD-10-CM | POA: Insufficient documentation

## 2013-06-16 LAB — COMPREHENSIVE METABOLIC PANEL
Alkaline Phosphatase: 53 U/L (ref 39–117)
BUN: 11 mg/dL (ref 6–23)
CO2: 19 mEq/L (ref 19–32)
Chloride: 101 mEq/L (ref 96–112)
Creatinine, Ser: 1.09 mg/dL (ref 0.50–1.35)
GFR calc Af Amer: >90 mL/min (ref >90)
GFR calc non Af Amer: 87 mL/min — ABNORMAL LOW (ref >90)
Glucose, Bld: 143 mg/dL — ABNORMAL HIGH (ref 70–99)
Potassium: 3.3 mEq/L — ABNORMAL LOW (ref 3.5–5.1)
Sodium: 136 mEq/L (ref 135–145)
Total Bilirubin: 0.6 mg/dL (ref 0.3–1.2)

## 2013-06-16 LAB — CBC WITH DIFFERENTIAL/PLATELET
HCT: 46.1 % (ref 39.0–52.0)
Hemoglobin: 16.7 g/dL (ref 13.0–17.0)
Lymphs Abs: 4 10*3/uL (ref 0.7–4.0)
MCH: 30.9 pg (ref 26.0–34.0)
Monocytes Absolute: 0.8 10*3/uL (ref 0.1–1.0)
Monocytes Relative: 10 % (ref 3–12)
Neutro Abs: 2.7 10*3/uL (ref 1.7–7.7)
Neutrophils Relative %: 35 % — ABNORMAL LOW (ref 43–77)
RBC: 5.4 MIL/uL (ref 4.22–5.81)

## 2013-06-16 LAB — URINALYSIS, ROUTINE W REFLEX MICROSCOPIC
Ketones, ur: NEGATIVE mg/dL
Leukocytes, UA: NEGATIVE
Nitrite: NEGATIVE
Protein, ur: 30 mg/dL — AB
Specific Gravity, Urine: 1.026 (ref 1.005–1.030)
Urobilinogen, UA: 1 mg/dL (ref 0.0–1.0)

## 2013-06-16 LAB — URINE MICROSCOPIC-ADD ON

## 2013-06-16 LAB — LIPASE, BLOOD: Lipase: 43 U/L (ref 11–59)

## 2013-06-16 MED ORDER — PROMETHAZINE HCL 25 MG/ML IJ SOLN
12.5000 mg | Freq: Once | INTRAMUSCULAR | Status: AC
  Administered 2013-06-16: 12.5 mg via INTRAVENOUS
  Filled 2013-06-16: qty 1

## 2013-06-16 MED ORDER — DIPHENHYDRAMINE HCL 50 MG/ML IJ SOLN
12.5000 mg | Freq: Once | INTRAMUSCULAR | Status: AC
  Administered 2013-06-16: 12.5 mg via INTRAVENOUS
  Filled 2013-06-16: qty 1

## 2013-06-16 MED ORDER — PROMETHAZINE HCL 25 MG RE SUPP: 25.0000 mg | Freq: Four times a day (QID) | RECTAL | Status: DC | PRN

## 2013-06-16 MED ORDER — ONDANSETRON 4 MG PO TBDP: 4.0000 mg | ORAL_TABLET | Freq: Three times a day (TID) | ORAL | Status: DC | PRN

## 2013-06-16 MED ORDER — MECLIZINE HCL 25 MG PO TABS: 50.0000 mg | ORAL_TABLET | Freq: Once | ORAL | Status: DC

## 2013-06-16 MED ORDER — METOCLOPRAMIDE HCL 5 MG/ML IJ SOLN
10.0000 mg | Freq: Once | INTRAMUSCULAR | Status: AC
  Administered 2013-06-16: 10 mg via INTRAVENOUS
  Filled 2013-06-16: qty 2

## 2013-06-16 MED ORDER — FENTANYL CITRATE 0.05 MG/ML IJ SOLN
100.0000 ug | Freq: Once | INTRAMUSCULAR | Status: AC
  Administered 2013-06-16: 100 ug via INTRAVENOUS
  Filled 2013-06-16: qty 2

## 2013-06-16 MED ORDER — SODIUM CHLORIDE 0.9 % IV BOLUS (SEPSIS)
1000.0000 mL | Freq: Once | INTRAVENOUS | Status: AC
  Administered 2013-06-16: 1000 mL via INTRAVENOUS

## 2013-06-16 NOTE — ED Provider Notes (Signed)
Medical screening examination/treatment/procedure(s) were performed by non-physician practitioner and as supervising physician I was immediately available for consultation/collaboration.  EKG Interpretation    Date/Time:  Saturday June 16 2013 17:13:00 EST Ventricular Rate:  80 PR Interval:  138 QRS Duration: 96 QT Interval:  408 QTC Calculation: 471 R Axis:   74 Text Interpretation:  Sinus rhythm Ventricular premature complex Borderline prolonged QT interval Baseline wander in lead(s) II III aVL aVF V3 V5 V6 No significant change since last tracing Confirmed by POLLINA  MD, CHRISTOPHER ([id number]) on 06/16/2013 5:27:20 PM              Gilda Crease, MD 06/16/13 2327

## 2013-06-16 NOTE — ED Notes (Addendum)
Pt from home via EMS c/o LUQ pain. Pt reports N/V/D since yesterday. Pt has cough, has CP since yesterday from coughing. Pt is A&O and in NAD. Pt given 4 Zofran en route

## 2013-06-16 NOTE — ED Provider Notes (Signed)
CSN: [id number]     Arrival date & time 06/16/13  1701 History   First MD Initiated Contact with Patient 06/16/13 1717     Chief Complaint  Patient presents with  . Abdominal Pain   (Consider location/radiation/quality/duration/timing/severity/associated sxs/prior Treatment) HPI Comments: Patient with history of bipolar disorder, recent partial nephrectomy for clear cell, history of substance abuse, pancreatitis -- presents with complaint of lower chest and upper abdominal pain, nausea, vomiting, diarrhea, lower abdominal pain that began yesterday. Patient also has noted hematuria. Subjective fever yesterday. No upper respiratory tract infection symptoms. Patient denies cough or shortness of breath to me. Has been taking chronic medications at home but no additional meds. States last saw doctors at [facility] 2 weeks ago and states things were going well. The onset of this condition was acute. The course is constant. Aggravating factors: none. Alleviating factors: none.    The history is provided by the patient and medical records.    Past Medical History  Diagnosis Date  . Seizures   . Hypertension   . Depression   . Pancreatitis   . Liver cirrhosis   . Coronary artery disease   . Angina   . Shortness of breath   . Headache(784.0)   . Peripheral vascular disease April 2011    Left Pop  . Hypercholesteremia   . Schizophrenia   . Bipolar 1 disorder   . Cancer of kidney    Past Surgical History  Procedure Laterality Date  . Chest surgery    . Left leg surgery    . Mastectomy     Family History  Problem Relation Age of Onset  . Stroke Other    History  Substance Use Topics  . Smoking status: Current Every Day Smoker -- 0.10 packs/day for 4 years    Types: Cigarettes  . Smokeless tobacco: Never Used  . Alcohol Use: 4.8 oz/week    6 Glasses of wine, 2 Cans of beer per week     Comment: 2-3 bottles wine/2 40 oz beers daily    Review of Systems  Constitutional: Negative for  fever.  HENT: Negative for rhinorrhea and sore throat.   Eyes: Negative for redness.  Respiratory: Negative for cough.   Cardiovascular: Negative for chest pain.  Gastrointestinal: Positive for nausea, vomiting, abdominal pain and diarrhea.  Genitourinary: Positive for hematuria. Negative for dysuria.  Musculoskeletal: Negative for myalgias.  Skin: Negative for rash.  Neurological: Negative for headaches.    Allergies  Depakote; Morphine; Penicillins; and Hydrocodone-acetaminophen  Home Medications   Current Outpatient Rx  Name  Route  Sig  Dispense  Refill  . amLODipine (NORVASC) 5 MG tablet   Oral   Take 1 tablet (5 mg total) by mouth daily. For hypertension management         . ARIPiprazole (ABILIFY) 2 MG tablet   Oral   Take 2 tablets (4 mg total) by mouth at bedtime. For mood control   60 tablet   0   . diphenhydrAMINE (BENADRYL) 25 MG tablet   Oral   Take 25 mg by mouth every 6 (six) hours as needed for itching.         . DULoxetine (CYMBALTA) 30 MG capsule   Oral   Take 1 capsule (30 mg total) by mouth daily. For depression   30 capsule   0   . metoprolol tartrate (LOPRESSOR) 25 MG tablet   Oral   Take 1 tablet (25 mg total) by mouth 2 (two) times daily.  For high blood pressure control/management         . simvastatin (ZOCOR) 40 MG tablet   Oral   Take 1 tablet (40 mg total) by mouth every evening. For high cholesterol control   30 tablet      . traZODone (DESYREL) 100 MG tablet   Oral   Take 200 mg by mouth at bedtime.         . ondansetron (ZOFRAN ODT) 4 MG disintegrating tablet   Oral   Take 1 tablet (4 mg total) by mouth every 8 (eight) hours as needed for nausea or vomiting.   10 tablet   0   . promethazine (PHENERGAN) 25 MG suppository   Rectal   Place 1 suppository (25 mg total) rectally every 6 (six) hours as needed for nausea or vomiting.   12 each   0    BP 143/105  Pulse 77  Resp 20  SpO2 98%  Physical Exam  Nursing  note and vitals reviewed. Constitutional: He appears well-developed and well-nourished.  HENT:  Head: Normocephalic and atraumatic.  Eyes: Conjunctivae are normal. Right eye exhibits no discharge. Left eye exhibits no discharge.  Neck: Normal range of motion. Neck supple.  Cardiovascular: Normal rate, regular rhythm and normal heart sounds.   Pulmonary/Chest: Effort normal and breath sounds normal.  Abdominal: Soft. Bowel sounds are normal. He exhibits no distension. There is tenderness (bilateral, lower, mild). There is no rebound and no guarding.  Neurological: He is alert.  Skin: Skin is warm and dry.  Psychiatric: He has a normal mood and affect.    ED Course  Procedures (including critical care time) Labs Review Labs Reviewed  CBC WITH DIFFERENTIAL - Abnormal; Notable for the following:    MCHC 36.2 (*)    Neutrophils Relative % 35 (*)    Lymphocytes Relative 52 (*)    All other components within normal limits  COMPREHENSIVE METABOLIC PANEL - Abnormal; Notable for the following:    Potassium 3.3 (*)    Glucose, Bld 143 (*)    GFR calc non Af Amer 87 (*)    All other components within normal limits  URINALYSIS, ROUTINE W REFLEX MICROSCOPIC - Abnormal; Notable for the following:    Color, Urine AMBER (*)    APPearance CLOUDY (*)    Bilirubin Urine SMALL (*)    Protein, ur 30 (*)    All other components within normal limits  LIPASE, BLOOD  URINE MICROSCOPIC-ADD ON   Imaging Review No results found.  EKG Interpretation    Date/Time:  Saturday June 16 2013 17:13:00 EST Ventricular Rate:  80 PR Interval:  138 QRS Duration: 96 QT Interval:  408 QTC Calculation: 471 R Axis:   74 Text Interpretation:  Sinus rhythm Ventricular premature complex Borderline prolonged QT interval Baseline wander in lead(s) II III aVL aVF V3 V5 V6 No significant change since last tracing Confirmed by POLLINA  MD, CHRISTOPHER ([id number]) on 06/16/2013 5:27:20 PM           5:41 PM  Patient seen and examined. Work-up initiated. Medications ordered.   Vital signs reviewed and are as follows: Filed Vitals:   06/16/13 1723  BP: 143/105  Pulse: 77  Resp: 20   7:38 PM Pt updated on results. States that abdominal pain is improved and he feels less nauseous. I have given ginger ale.   8:32 PM Pt reports vomiting after ginger ale. Also c/o vertigo. Will give phenergan and reassess.   11:24 PM Pt  feeling better. Will d/c to home with anti-emetics. Mild epigastric tenderness on exam.   The patient was urged to return to the Emergency Department immediately with worsening of current symptoms, worsening abdominal pain, persistent vomiting, blood noted in stools, fever, or any other concerns. The patient verbalized understanding.    MDM   1. Nausea vomiting and diarrhea    Patient with N/V/D, abdominal pain -- previous ED visits and admissions for intractable vomiting -- presents with same symptoms. No fever. Labs reassuring. No blood in urine, normal renal function -- status post partial nephrectomy. Appears to be doing well from that standpoint. He has been monitored with improvement in ED for 6 hours. Do not suspect dangerous or emergent medical condition that would warrant admission at this time.    Renne Crigler, PA-C 06/16/13 2327

## 2013-06-16 NOTE — ED Notes (Signed)
Pt states he has lower abdominal pain with n/v. Pt states pain is constant and 10/10. Pt loudly heaving and wretching in room.

## 2013-06-16 NOTE — ED Notes (Signed)
PA at bedside. PA Geiple at bedside.

## 2013-06-16 NOTE — ED Notes (Signed)
Patient was unable to keep the ginger ale down. Still vomiting

## 2013-07-19 DIAGNOSIS — Z9151 Personal history of suicidal behavior: Secondary | ICD-10-CM

## 2013-07-19 HISTORY — DX: Personal history of suicidal behavior: Z91.51

## 2013-08-02 ENCOUNTER — Emergency Department (HOSPITAL_COMMUNITY)
Admission: EM | Admit: 2013-08-02 | Discharge: 2013-08-02 | Disposition: A | Discharge status: Other Acute Inpatient Hospital | Payer: No Typology Code available for payment source | Attending: Emergency Medicine | Admitting: Emergency Medicine

## 2013-08-02 ENCOUNTER — Emergency Department (HOSPITAL_COMMUNITY): Payer: Self-pay

## 2013-08-02 ENCOUNTER — Encounter (HOSPITAL_COMMUNITY): Payer: Self-pay | Admitting: Behavioral Health

## 2013-08-02 ENCOUNTER — Inpatient Hospital Stay (HOSPITAL_COMMUNITY)
Admission: AD | Admit: 2013-08-02 | Discharge: 2013-08-06 | DRG: 897 | Disposition: A | Discharge status: Home / Self Care | Payer: No Typology Code available for payment source | Source: Intra-hospital | Attending: Psychiatry | Admitting: Psychiatry

## 2013-08-02 ENCOUNTER — Encounter (HOSPITAL_COMMUNITY): Payer: Self-pay | Admitting: Emergency Medicine

## 2013-08-02 DIAGNOSIS — F4310 Post-traumatic stress disorder, unspecified: Secondary | ICD-10-CM | POA: Diagnosis present

## 2013-08-02 DIAGNOSIS — F1910 Other psychoactive substance abuse, uncomplicated: Secondary | ICD-10-CM | POA: Diagnosis present

## 2013-08-02 DIAGNOSIS — S1093XA Contusion of unspecified part of neck, initial encounter: Secondary | ICD-10-CM

## 2013-08-02 DIAGNOSIS — Z862 Personal history of diseases of the blood and blood-forming organs and certain disorders involving the immune mechanism: Secondary | ICD-10-CM | POA: Insufficient documentation

## 2013-08-02 DIAGNOSIS — F17200 Nicotine dependence, unspecified, uncomplicated: Secondary | ICD-10-CM | POA: Insufficient documentation

## 2013-08-02 DIAGNOSIS — Z85528 Personal history of other malignant neoplasm of kidney: Secondary | ICD-10-CM | POA: Insufficient documentation

## 2013-08-02 DIAGNOSIS — S01501A Unspecified open wound of lip, initial encounter: Secondary | ICD-10-CM | POA: Insufficient documentation

## 2013-08-02 DIAGNOSIS — I10 Essential (primary) hypertension: Secondary | ICD-10-CM | POA: Diagnosis present

## 2013-08-02 DIAGNOSIS — Z79899 Other long term (current) drug therapy: Secondary | ICD-10-CM | POA: Insufficient documentation

## 2013-08-02 DIAGNOSIS — I739 Peripheral vascular disease, unspecified: Secondary | ICD-10-CM | POA: Diagnosis present

## 2013-08-02 DIAGNOSIS — F10239 Alcohol dependence with withdrawal, unspecified: Principal | ICD-10-CM | POA: Diagnosis present

## 2013-08-02 DIAGNOSIS — R569 Unspecified convulsions: Secondary | ICD-10-CM

## 2013-08-02 DIAGNOSIS — Z8719 Personal history of other diseases of the digestive system: Secondary | ICD-10-CM | POA: Insufficient documentation

## 2013-08-02 DIAGNOSIS — F319 Bipolar disorder, unspecified: Secondary | ICD-10-CM | POA: Diagnosis present

## 2013-08-02 DIAGNOSIS — F315 Bipolar disorder, current episode depressed, severe, with psychotic features: Secondary | ICD-10-CM | POA: Diagnosis present

## 2013-08-02 DIAGNOSIS — F209 Schizophrenia, unspecified: Secondary | ICD-10-CM | POA: Insufficient documentation

## 2013-08-02 DIAGNOSIS — F419 Anxiety disorder, unspecified: Secondary | ICD-10-CM | POA: Diagnosis present

## 2013-08-02 DIAGNOSIS — E7800 Pure hypercholesterolemia, unspecified: Secondary | ICD-10-CM | POA: Diagnosis present

## 2013-08-02 DIAGNOSIS — I2510 Atherosclerotic heart disease of native coronary artery without angina pectoris: Secondary | ICD-10-CM | POA: Diagnosis present

## 2013-08-02 DIAGNOSIS — Z880 Allergy status to penicillin: Secondary | ICD-10-CM | POA: Insufficient documentation

## 2013-08-02 DIAGNOSIS — Z8669 Personal history of other diseases of the nervous system and sense organs: Secondary | ICD-10-CM | POA: Insufficient documentation

## 2013-08-02 DIAGNOSIS — F1020 Alcohol dependence, uncomplicated: Secondary | ICD-10-CM | POA: Insufficient documentation

## 2013-08-02 DIAGNOSIS — IMO0002 Reserved for concepts with insufficient information to code with codable children: Secondary | ICD-10-CM | POA: Insufficient documentation

## 2013-08-02 DIAGNOSIS — G40909 Epilepsy, unspecified, not intractable, without status epilepticus: Secondary | ICD-10-CM | POA: Insufficient documentation

## 2013-08-02 DIAGNOSIS — S0083XA Contusion of other part of head, initial encounter: Secondary | ICD-10-CM

## 2013-08-02 DIAGNOSIS — F339 Major depressive disorder, recurrent, unspecified: Secondary | ICD-10-CM | POA: Diagnosis present

## 2013-08-02 DIAGNOSIS — F10939 Alcohol use, unspecified with withdrawal, unspecified: Secondary | ICD-10-CM | POA: Insufficient documentation

## 2013-08-02 DIAGNOSIS — Z9889 Other specified postprocedural states: Secondary | ICD-10-CM | POA: Insufficient documentation

## 2013-08-02 DIAGNOSIS — F1994 Other psychoactive substance use, unspecified with psychoactive substance-induced mood disorder: Secondary | ICD-10-CM | POA: Diagnosis present

## 2013-08-02 DIAGNOSIS — F1010 Alcohol abuse, uncomplicated: Secondary | ICD-10-CM | POA: Diagnosis present

## 2013-08-02 DIAGNOSIS — K7460 Unspecified cirrhosis of liver: Secondary | ICD-10-CM | POA: Diagnosis present

## 2013-08-02 DIAGNOSIS — F259 Schizoaffective disorder, unspecified: Secondary | ICD-10-CM | POA: Diagnosis present

## 2013-08-02 DIAGNOSIS — S0990XA Unspecified injury of head, initial encounter: Secondary | ICD-10-CM | POA: Insufficient documentation

## 2013-08-02 DIAGNOSIS — F411 Generalized anxiety disorder: Secondary | ICD-10-CM | POA: Diagnosis present

## 2013-08-02 DIAGNOSIS — S0003XA Contusion of scalp, initial encounter: Secondary | ICD-10-CM | POA: Insufficient documentation

## 2013-08-02 DIAGNOSIS — F329 Major depressive disorder, single episode, unspecified: Secondary | ICD-10-CM | POA: Diagnosis present

## 2013-08-02 DIAGNOSIS — Z8639 Personal history of other endocrine, nutritional and metabolic disease: Secondary | ICD-10-CM | POA: Insufficient documentation

## 2013-08-02 HISTORY — DX: Malignant neoplasm of unspecified site of unspecified female breast: C50.919

## 2013-08-02 LAB — COMPREHENSIVE METABOLIC PANEL
ALBUMIN: 3.8 g/dL (ref 3.5–5.2)
ALT: 99 U/L — AB (ref 0–53)
AST: 106 U/L — ABNORMAL HIGH (ref 0–37)
Alkaline Phosphatase: 73 U/L (ref 39–117)
BUN: 5 mg/dL — ABNORMAL LOW (ref 6–23)
CO2: 21 mEq/L (ref 19–32)
Calcium: 8.7 mg/dL (ref 8.4–10.5)
Chloride: 98 mEq/L (ref 96–112)
Creatinine, Ser: 0.81 mg/dL (ref 0.50–1.35)
GFR calc Af Amer: >90 mL/min (ref >90)
GFR calc non Af Amer: >90 mL/min (ref >90)
Glucose, Bld: 75 mg/dL (ref 70–99)
Potassium: 4 mEq/L (ref 3.7–5.3)
SODIUM: 136 meq/L — AB (ref 137–147)
TOTAL PROTEIN: 7.8 g/dL (ref 6.0–8.3)
Total Bilirubin: <0.2 mg/dL — ABNORMAL LOW (ref 0.3–1.2)

## 2013-08-02 LAB — CBC WITH DIFFERENTIAL/PLATELET
Basophils Absolute: 0 10*3/uL (ref 0.0–0.1)
Basophils Relative: 0 % (ref 0–1)
EOS ABS: 0.1 10*3/uL (ref 0.0–0.7)
Eosinophils Relative: 1 % (ref 0–5)
HCT: 45.2 % (ref 39.0–52.0)
HEMOGLOBIN: 16.4 g/dL (ref 13.0–17.0)
LYMPHS ABS: 2.4 10*3/uL (ref 0.7–4.0)
LYMPHS PCT: 39 % (ref 12–46)
MCH: 30.9 pg (ref 26.0–34.0)
MCHC: 36.3 g/dL — ABNORMAL HIGH (ref 30.0–36.0)
MCV: 85.3 fL (ref 78.0–100.0)
MONOS PCT: 7 % (ref 3–12)
Monocytes Absolute: 0.4 10*3/uL (ref 0.1–1.0)
Neutro Abs: 3.2 10*3/uL (ref 1.7–7.7)
Neutrophils Relative %: 53 % (ref 43–77)
PLATELETS: 175 10*3/uL (ref 150–400)
RBC: 5.3 MIL/uL (ref 4.22–5.81)
RDW: 14.6 % (ref 11.5–15.5)
WBC: 6 10*3/uL (ref 4.0–10.5)

## 2013-08-02 LAB — URINALYSIS, ROUTINE W REFLEX MICROSCOPIC
BILIRUBIN URINE: NEGATIVE
Glucose, UA: NEGATIVE mg/dL
HGB URINE DIPSTICK: NEGATIVE
Ketones, ur: NEGATIVE mg/dL
Leukocytes, UA: NEGATIVE
Nitrite: NEGATIVE
PH: 6 (ref 5.0–8.0)
Protein, ur: NEGATIVE mg/dL
Specific Gravity, Urine: 1.008 (ref 1.005–1.030)
UROBILINOGEN UA: 0.2 mg/dL (ref 0.0–1.0)

## 2013-08-02 LAB — RAPID URINE DRUG SCREEN, HOSP PERFORMED
AMPHETAMINES: NOT DETECTED
Barbiturates: NOT DETECTED
Benzodiazepines: NOT DETECTED
Cocaine: POSITIVE — AB
OPIATES: NOT DETECTED
Tetrahydrocannabinol: NOT DETECTED

## 2013-08-02 LAB — ETHANOL: ALCOHOL ETHYL (B): 315 mg/dL — AB (ref 0–11)

## 2013-08-02 MED ORDER — TRAZODONE HCL 100 MG PO TABS: 200.0000 mg | ORAL_TABLET | Freq: Every day | ORAL | Status: DC

## 2013-08-02 MED ORDER — CHLORDIAZEPOXIDE HCL 25 MG PO CAPS
25.0000 mg | ORAL_CAPSULE | Freq: Four times a day (QID) | ORAL | Status: AC | PRN
  Administered 2013-08-03: 25 mg via ORAL
  Filled 2013-08-02: qty 1

## 2013-08-02 MED ORDER — METOPROLOL TARTRATE 25 MG PO TABS
25.0000 mg | ORAL_TABLET | Freq: Two times a day (BID) | ORAL | Status: DC
  Administered 2013-08-02 – 2013-08-06 (×8): 25 mg via ORAL
  Filled 2013-08-02 (×3): qty 1
  Filled 2013-08-02: qty 14
  Filled 2013-08-02 (×7): qty 1
  Filled 2013-08-02: qty 14
  Filled 2013-08-02: qty 1

## 2013-08-02 MED ORDER — METOPROLOL TARTRATE 25 MG PO TABS
25.0000 mg | ORAL_TABLET | Freq: Two times a day (BID) | ORAL | Status: DC
  Administered 2013-08-02: 25 mg via ORAL
  Filled 2013-08-02: qty 1

## 2013-08-02 MED ORDER — CHLORDIAZEPOXIDE HCL 25 MG PO CAPS: 25.0000 mg | ORAL_CAPSULE | Freq: Every day | ORAL | Status: DC

## 2013-08-02 MED ORDER — CHLORDIAZEPOXIDE HCL 25 MG PO CAPS
25.0000 mg | ORAL_CAPSULE | Freq: Four times a day (QID) | ORAL | Status: DC
  Administered 2013-08-02 (×2): 25 mg via ORAL
  Filled 2013-08-02 (×2): qty 1

## 2013-08-02 MED ORDER — ARIPIPRAZOLE 2 MG PO TABS
4.0000 mg | ORAL_TABLET | Freq: Every day | ORAL | Status: DC
Start: 2013-08-02 — End: 2013-08-02
  Filled 2013-08-02: qty 2

## 2013-08-02 MED ORDER — AMLODIPINE BESYLATE 5 MG PO TABS
5.0000 mg | ORAL_TABLET | Freq: Every day | ORAL | Status: DC
  Administered 2013-08-02: 5 mg via ORAL
  Filled 2013-08-02: qty 1

## 2013-08-02 MED ORDER — LOPERAMIDE HCL 2 MG PO CAPS: 2.0000 mg | ORAL_CAPSULE | ORAL | Status: DC | PRN

## 2013-08-02 MED ORDER — MAGNESIUM HYDROXIDE 400 MG/5ML PO SUSP: 30.0000 mL | Freq: Every day | ORAL | Status: DC | PRN

## 2013-08-02 MED ORDER — CHLORDIAZEPOXIDE HCL 25 MG PO CAPS
25.0000 mg | ORAL_CAPSULE | Freq: Four times a day (QID) | ORAL | Status: DC | PRN
Start: 2013-08-02 — End: 2013-08-02

## 2013-08-02 MED ORDER — ONDANSETRON 4 MG PO TBDP
4.0000 mg | ORAL_TABLET | Freq: Four times a day (QID) | ORAL | Status: AC | PRN
  Administered 2013-08-03: 4 mg via ORAL
  Filled 2013-08-02: qty 1

## 2013-08-02 MED ORDER — CHLORDIAZEPOXIDE HCL 25 MG PO CAPS: 25.0000 mg | ORAL_CAPSULE | ORAL | Status: DC

## 2013-08-02 MED ORDER — SIMVASTATIN 40 MG PO TABS: 40.0000 mg | ORAL_TABLET | Freq: Every evening | ORAL | Status: DC

## 2013-08-02 MED ORDER — LORAZEPAM 1 MG PO TABS: 0.0000 mg | ORAL_TABLET | Freq: Two times a day (BID) | ORAL | Status: DC

## 2013-08-02 MED ORDER — LOPERAMIDE HCL 2 MG PO CAPS
2.0000 mg | ORAL_CAPSULE | ORAL | Status: AC | PRN
Start: 2013-08-02 — End: 2013-08-05

## 2013-08-02 MED ORDER — ADULT MULTIVITAMIN W/MINERALS CH
1.0000 | ORAL_TABLET | Freq: Every day | ORAL | Status: DC
  Administered 2013-08-02: 1 via ORAL
  Filled 2013-08-02: qty 1

## 2013-08-02 MED ORDER — CHLORDIAZEPOXIDE HCL 25 MG PO CAPS
25.0000 mg | ORAL_CAPSULE | ORAL | Status: AC
  Administered 2013-08-05 (×2): 25 mg via ORAL
  Filled 2013-08-02 (×2): qty 1

## 2013-08-02 MED ORDER — ONDANSETRON 4 MG PO TBDP
4.0000 mg | ORAL_TABLET | Freq: Four times a day (QID) | ORAL | Status: DC | PRN
  Administered 2013-08-02: 4 mg via ORAL
  Filled 2013-08-02: qty 1

## 2013-08-02 MED ORDER — DULOXETINE HCL 30 MG PO CPEP
30.0000 mg | ORAL_CAPSULE | Freq: Every day | ORAL | Status: DC
  Administered 2013-08-03 – 2013-08-06 (×4): 30 mg via ORAL
  Filled 2013-08-02 (×2): qty 1
  Filled 2013-08-02: qty 7
  Filled 2013-08-02 (×3): qty 1

## 2013-08-02 MED ORDER — DULOXETINE HCL 30 MG PO CPEP
30.0000 mg | ORAL_CAPSULE | Freq: Every day | ORAL | Status: DC
  Administered 2013-08-02: 30 mg via ORAL
  Filled 2013-08-02: qty 1

## 2013-08-02 MED ORDER — CHLORDIAZEPOXIDE HCL 25 MG PO CAPS: 25.0000 mg | ORAL_CAPSULE | Freq: Three times a day (TID) | ORAL | Status: DC

## 2013-08-02 MED ORDER — CHLORDIAZEPOXIDE HCL 25 MG PO CAPS
25.0000 mg | ORAL_CAPSULE | Freq: Every day | ORAL | Status: AC
  Administered 2013-08-06: 25 mg via ORAL
  Filled 2013-08-02: qty 1

## 2013-08-02 MED ORDER — CHLORDIAZEPOXIDE HCL 25 MG PO CAPS
25.0000 mg | ORAL_CAPSULE | Freq: Once | ORAL | Status: AC
  Administered 2013-08-02: 50 mg via ORAL
  Filled 2013-08-02: qty 1

## 2013-08-02 MED ORDER — ATORVASTATIN CALCIUM 20 MG PO TABS
20.0000 mg | ORAL_TABLET | Freq: Every day | ORAL | Status: DC
  Administered 2013-08-03 – 2013-08-05 (×3): 20 mg via ORAL
  Filled 2013-08-02 (×5): qty 1

## 2013-08-02 MED ORDER — ADULT MULTIVITAMIN W/MINERALS CH
1.0000 | ORAL_TABLET | Freq: Every day | ORAL | Status: DC
  Administered 2013-08-03 – 2013-08-06 (×4): 1 via ORAL
  Filled 2013-08-02 (×6): qty 1

## 2013-08-02 MED ORDER — ZOLPIDEM TARTRATE 5 MG PO TABS: 5.0000 mg | ORAL_TABLET | Freq: Every evening | ORAL | Status: DC | PRN

## 2013-08-02 MED ORDER — ALUM & MAG HYDROXIDE-SIMETH 200-200-20 MG/5ML PO SUSP: 30.0000 mL | ORAL | Status: DC | PRN

## 2013-08-02 MED ORDER — HYDROXYZINE HCL 25 MG PO TABS: 25.0000 mg | ORAL_TABLET | Freq: Four times a day (QID) | ORAL | Status: DC | PRN

## 2013-08-02 MED ORDER — NICOTINE 21 MG/24HR TD PT24
21.0000 mg | MEDICATED_PATCH | Freq: Every day | TRANSDERMAL | Status: DC
  Filled 2013-08-02: qty 1

## 2013-08-02 MED ORDER — ONDANSETRON HCL 4 MG PO TABS: 4.0000 mg | ORAL_TABLET | Freq: Three times a day (TID) | ORAL | Status: DC | PRN

## 2013-08-02 MED ORDER — SIMVASTATIN 40 MG PO TABS
40.0000 mg | ORAL_TABLET | Freq: Every evening | ORAL | Status: DC
  Administered 2013-08-02: 40 mg via ORAL
  Filled 2013-08-02: qty 1

## 2013-08-02 MED ORDER — TRAZODONE HCL 100 MG PO TABS
200.0000 mg | ORAL_TABLET | Freq: Every day | ORAL | Status: DC
  Administered 2013-08-02 – 2013-08-03 (×2): 200 mg via ORAL
  Filled 2013-08-02 (×4): qty 2

## 2013-08-02 MED ORDER — ARIPIPRAZOLE 2 MG PO TABS
4.0000 mg | ORAL_TABLET | Freq: Every day | ORAL | Status: DC
  Administered 2013-08-02 – 2013-08-05 (×4): 4 mg via ORAL
  Filled 2013-08-02: qty 2
  Filled 2013-08-02: qty 14
  Filled 2013-08-02 (×6): qty 2

## 2013-08-02 MED ORDER — NAPROXEN 500 MG PO TABS
500.0000 mg | ORAL_TABLET | Freq: Two times a day (BID) | ORAL | Status: DC
  Administered 2013-08-02 – 2013-08-03 (×3): 500 mg via ORAL
  Filled 2013-08-02 (×7): qty 1

## 2013-08-02 MED ORDER — HYDROXYZINE HCL 25 MG PO TABS
25.0000 mg | ORAL_TABLET | Freq: Four times a day (QID) | ORAL | Status: AC | PRN
  Administered 2013-08-03 – 2013-08-04 (×2): 25 mg via ORAL
  Filled 2013-08-02 (×2): qty 1

## 2013-08-02 MED ORDER — LORAZEPAM 1 MG PO TABS
0.0000 mg | ORAL_TABLET | Freq: Four times a day (QID) | ORAL | Status: DC
  Administered 2013-08-02: 1 mg via ORAL
  Filled 2013-08-02 (×2): qty 1

## 2013-08-02 MED ORDER — AMLODIPINE BESYLATE 5 MG PO TABS
5.0000 mg | ORAL_TABLET | Freq: Every day | ORAL | Status: DC
  Administered 2013-08-03 – 2013-08-06 (×4): 5 mg via ORAL
  Filled 2013-08-02 (×2): qty 1
  Filled 2013-08-02: qty 7
  Filled 2013-08-02 (×3): qty 1

## 2013-08-02 MED ORDER — CHLORDIAZEPOXIDE HCL 25 MG PO CAPS
25.0000 mg | ORAL_CAPSULE | Freq: Three times a day (TID) | ORAL | Status: AC
  Administered 2013-08-04 (×3): 25 mg via ORAL
  Filled 2013-08-02 (×3): qty 1

## 2013-08-02 MED ORDER — CHLORDIAZEPOXIDE HCL 25 MG PO CAPS
25.0000 mg | ORAL_CAPSULE | Freq: Four times a day (QID) | ORAL | Status: AC
  Administered 2013-08-02 – 2013-08-03 (×4): 25 mg via ORAL
  Filled 2013-08-02 (×4): qty 1

## 2013-08-02 NOTE — ED Notes (Signed)
Phlebotomy at bedside drawing blood. 
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
## 2013-08-02 NOTE — ED Notes (Signed)
[name] states his n&v has resolved and he has tolerated his evening meal as well as medications.  C/O chills.  Fluids encouraged.

## 2013-08-02 NOTE — ED Notes (Signed)
PA at bedside to speak with patient Patient now states that he is "peeing blood" and agrees to stay Urinal given
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
## 2013-08-02 NOTE — ED Notes (Signed)
[name], who prefers to be called [name], is calm and cooperative upon transfer to room 43.  Ice pack,fluids, and prn medications offered.  Encouraged rest until breakfast.

## 2013-08-02 NOTE — Progress Notes (Signed)
[facility] provided pt with a list of primary care resources, highlighting [facility] [place]. Patient stated that he was pending Medicaid and he was familiar with [facility] [place].

## 2013-08-02 NOTE — ED Notes (Signed)
Patient transported to CT 

## 2013-08-02 NOTE — Progress Notes (Signed)
Per Psychiatrist (Dr. Lovena Le) meets criteria for inpatient hospitalization.  Patient was referred to [facility].
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
## 2013-08-02 NOTE — ED Provider Notes (Signed)
Medical screening examination/treatment/procedure(s) were performed by non-physician practitioner and as supervising physician I was immediately available for consultation/collaboration.    Kalman Drape, MD 08/02/13 [phone]

## 2013-08-02 NOTE — ED Notes (Signed)
Patient has removed hospital gown and is now dressed in own clothing Patient states that he wants to leave "since ya'll aren't giving me IV pain medication." Patient refusing CT scan  PA made aware

## 2013-08-03 ENCOUNTER — Encounter (HOSPITAL_COMMUNITY): Payer: Self-pay | Admitting: Psychiatry

## 2013-08-03 DIAGNOSIS — F411 Generalized anxiety disorder: Secondary | ICD-10-CM

## 2013-08-03 DIAGNOSIS — F10939 Alcohol use, unspecified with withdrawal, unspecified: Principal | ICD-10-CM

## 2013-08-03 DIAGNOSIS — F339 Major depressive disorder, recurrent, unspecified: Secondary | ICD-10-CM

## 2013-08-03 DIAGNOSIS — F1020 Alcohol dependence, uncomplicated: Secondary | ICD-10-CM

## 2013-08-03 DIAGNOSIS — F259 Schizoaffective disorder, unspecified: Secondary | ICD-10-CM | POA: Diagnosis present

## 2013-08-03 DIAGNOSIS — F1910 Other psychoactive substance abuse, uncomplicated: Secondary | ICD-10-CM

## 2013-08-03 DIAGNOSIS — F10239 Alcohol dependence with withdrawal, unspecified: Principal | ICD-10-CM

## 2013-08-03 DIAGNOSIS — F4310 Post-traumatic stress disorder, unspecified: Secondary | ICD-10-CM

## 2013-08-03 MED ORDER — ENSURE COMPLETE PO LIQD
237.0000 mL | Freq: Two times a day (BID) | ORAL | Status: DC
  Administered 2013-08-04 – 2013-08-06 (×5): 237 mL via ORAL

## 2013-08-03 MED ORDER — MAGNESIUM CITRATE PO SOLN: 1.0000 | Freq: Once | ORAL | Status: DC

## 2013-08-03 MED ORDER — TRAMADOL HCL 50 MG PO TABS
50.0000 mg | ORAL_TABLET | Freq: Once | ORAL | Status: AC
  Administered 2013-08-03: 50 mg via ORAL
  Filled 2013-08-03: qty 1

## 2013-08-03 MED ORDER — NAPROXEN 500 MG PO TABS
500.0000 mg | ORAL_TABLET | Freq: Three times a day (TID) | ORAL | Status: DC
  Administered 2013-08-04 – 2013-08-06 (×8): 500 mg via ORAL
  Filled 2013-08-03 (×13): qty 1
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
## 2013-08-03 NOTE — Progress Notes (Deleted)
Recreation Therapy Notes  Date: 01.16.2015 Time: 2:45pm Location: [address] Dayroom   Group Topic: Leisure Education  Goal Area(s) Addresses:  Patient will identify positive leisure activities.  Patient will identify one positive benefit of participation in leisure activities.   Behavioral Response: Did not attend.   Laureen Ochs Jamill Wetmore, LRT/CTRS  Lane Hacker 08/03/2013 4:13 PM
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
## 2013-08-03 NOTE — Progress Notes (Signed)
Recreation Therapy Notes  INPATIENT RECREATION THERAPY ASSESSMENT  Patient Stressors: Family, Relationship, Death, Friends, Work, Youth worker,    Coping Skills: Isolate, Arguments, Substance Abuse, Avoidance, Music  Leisure Interests: Teaching laboratory technician (social media), Listening to Music, Geneticist, molecular, Actor, Walking  Personal Challenges: Anger, Communication, Concentration, Decision-Making, Expressing Yourself, Problem-Solving, Relationships, School Performances, Self-Esteem/Confidence, Social Interaction, Stress Management, Substance Abuse, Time Management, Trusting Others, Work Systems analyst  Patient aware of community resources? yes  [employer] patient aware of: [employer], [employer], Library, [employer], [employer], [employer], [employer], [place], [place], [place], [employer], [employer] and [employer], [place], May Classes  Patient indicated the following strengths:  NONE LISTED  Patient indicated interest in changing the following: Drugs/Alcohol  Patient currently participates in the following recreation activities: Drugs/Alcohol  Patient goal for hospitalization: Be back on my meds  Physical Disability: no  [place] of Residence: [place] of Residence: [place], LRT/CTRS  Lane Hacker 08/03/2013 4:49 PM

## 2013-08-03 NOTE — Progress Notes (Signed)
Recreation Therapy Notes  Date: 01.16.2015 Time: 2:45pm Location: [address] Dayroom   Group Topic: Leisure Education  Goal Area(s) Addresses:  Patient will identify positive leisure activities.  Patient will identify one positive benefit of participation in leisure activities.   Behavioral Response: Engaged, Attentive, Appropriate   Intervention: Game  Activity: Leisure ABC's. As a group patients were asked to identify leisure activities to correspond with each letter of the alphabet.   Education:  Leisure Education, Radiographer, therapeutic, Discharge Planning  Education Outcome: Acknowledges understanding  Clinical Observations/Feedback: Patient attended group session, but did not participate in activity. Patient made no contributions to group discussion, but appeared to actively listen as he maintained appropriate eye contact with speaker.      Laureen Ochs Naftoli Penny, LRT/CTRS  Lane Hacker 08/03/2013 4:13 PM

## 2013-08-03 NOTE — [facility] Group Notes (Signed)
[facility] LCSW Aftercare Discharge Planning Group Note   08/03/2013 9:41 AM  Participation Quality:  Did not attend group.    Ashden Sonnenberg, [name] Post

## 2013-08-03 NOTE — Progress Notes (Signed)
Adult Psychoeducational Group Note  Date:  08/03/2013 Time:  10:00am Group Topic/Focus:  Relapse Prevention Planning:   The focus of this group is to define relapse and discuss the need for planning to combat relapse.  Participation Level:  Did Not Attend  Participation Quality:    Affect:    Cognitive:    Insight:   Engagement in Group:   Modes of Intervention:    Additional Comments:  Pt did not attend group.  Marlowe Shores D 08/03/2013, 3:21 PM

## 2013-08-03 NOTE — Progress Notes (Signed)
NUTRITION ASSESSMENT  Pt identified as at risk on the Malnutrition Screen Tool  INTERVENTION: 1. Educated patient on the importance of nutrition and encouraged intake of food and beverages. 2. Discussed weight goals. 3. Supplements: Ensure Complete po BID, each supplement provides 350 kcal and 13 grams of protein com And MVI daily  NUTRITION DIAGNOSIS: Unintentional weight loss related to sub-optimal intake as evidenced by pt report.   Goal: Pt to meet >/= 90% of their estimated nutrition needs.  Monitor:  PO intake  Assessment:  Patient admitted for etoh, crack detox.  Patient reports eating some breakfast but no lunch.  Meds are making him more sleeping and slept through it.  Reports recent 6-7 lb weight loss secondary to increased etoh use.  Receptive to ensure.  Per e-chart, patient with a 14% weight loss in the past 3 months.    33 y.o. male  Height: Ht Readings from Last 1 Encounters:  08/02/13 5' 4.57" (1.64 m)    Weight: Wt Readings from Last 1 Encounters:  08/02/13 144 lb 8 oz (65.545 kg)    Weight Hx: Wt Readings from Last 10 Encounters:  08/02/13 144 lb 8 oz (65.545 kg)  04/27/13 168 lb (76.204 kg)  04/18/13 160 lb (72.576 kg)  03/28/13 173 lb (78.472 kg)  01/03/13 185 lb 3.2 oz (84.006 kg)  01/02/13 185 lb 3.2 oz (84.006 kg)  12/24/12 184 lb 1.4 oz (83.5 kg)  12/04/12 170 lb (77.111 kg)  11/22/12 183 lb (83.008 kg)  07/06/12 190 lb (86.183 kg)    BMI:  Body mass index is 24.37 kg/(m^2). Pt meets criteria for normal for hieght based on current BMI.  Estimated Nutritional Needs: Kcal: 25-30 kcal/kg Protein: > 1 gram protein/kg Fluid: 1 ml/kcal  Diet Order: General Pt is also offered choice of unit snacks mid-morning and mid-afternoon.  Pt is eating as desired.   Lab results and medications reviewed.   Antonieta Iba, RD, LDN Clinical Inpatient Dietitian Pager:  [phone] Weekend and after hours pager:  [phone]

## 2013-08-03 NOTE — Progress Notes (Signed)
Adult Psychoeducational Group Note  Date:  08/03/2013 Time:  10:34 PM  Group Topic/Focus:  Wrap-Up Group:   The focus of this group is to help patients review their daily goal of treatment and discuss progress on daily workbooks.  Participation Level:  Active  Participation Quality:  Appropriate and Attentive  Affect:  Appropriate  Cognitive:  Alert and Appropriate  Insight: Appropriate  Engagement in Group:  Engaged  Modes of Intervention:  Discussion, Education, Socialization and Support  Additional Comments:  Pt rated his day at a 2, 1 being the best. Pt said he did not feel depressed and was not thinking about hurting himself, but he has been drowsy and in some pain throughout the day.  [name] 08/03/2013, 10:34 PM
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
## 2013-08-03 NOTE — B[facility] Group Notes (Signed)
[facility] LCSW Group Therapy  Feelings Around Relapse 1:15 -2:30        08/03/2013  3:37 PM   Type of Therapy:  Group Therapy  Participation Level: Did not attend group.    Concha Pyo 08/03/2013 3:37 PM

## 2013-08-03 NOTE — Tx Team (Incomplete)
Interdisciplinary Treatment Plan Update   Date Reviewed:  08/03/2013  Time Reviewed:  8:29 AM  Progress in Treatment:   Attending groups: Yes Participating in groups: Yes Taking medication as prescribed: Yes  Tolerating medication: Yes Family/Significant other contact made: Yes  Patient understands diagnosis: Yes  Discussing patient identified problems/goals with staff: Yes Medical problems stabilized or resolved: Yes Denies suicidal/homicidal ideation: Yes Patient has not harmed self or others: Yes  For review of initial/current patient goals, please see plan of care.  Estimated Length of Stay:    Reasons for Continued Hospitalization:  Anxiety Depression Medication stabilization Suicidal ideation  New Problems/Goals identified:    Discharge Plan or Barriers:   Home with outpatient follow up  Additional Comments:  Attendees:  Patient:  08/03/2013 8:29 AM   Signature: Mylinda Latina, MD 08/03/2013 8:29 AM  Signature:   08/03/2013 8:29 AM  Signature:  Catalina Pizza, NP 08/03/2013 8:29 AM  Signature:Beverly Danelle Earthly, RN 08/03/2013 8:29 AM  Signature:   08/03/2013 8:29 AM  Signature:  Joette Catching, LCSW 08/03/2013 8:29 AM  Signature:  Regan Lemming, LCSW 08/03/2013 8:29 AM  Signature:  Lucinda Dell, Care Coordinator 08/03/2013 8:29 AM  Signature:  Marshall Cork, RN 08/03/2013 8:29 AM  Signature: Marilynne Halsted, RN 08/03/2013  8:29 AM  Signature:   Lars Pinks, RN [facility] 08/03/2013  8:29 AM  Signature:  Eduard Roux, RN 08/03/2013  8:29 AM    Scribe for Treatment Team:   Joette Catching,  08/03/2013 8:29 AM

## 2013-08-03 NOTE — [facility] Counselor (Signed)
Adult Psychosocial Assessment Update Interdisciplinary Team  Previous [facility] admissions/discharges:  Admissions Discharges  Date:03/28/13 Date:  03/30/14  Date: Date:  Date: Date:  Date: Date:  Date: Date:   Changes since the last Psychosocial Assessment (including adherence to outpatient mental health and/or substance abuse treatment, situational issues contributing to decompensation and/or relapse). Patient reports becoming increasingly depressed and relapsing on Alcohol.   He reports Recently MD found spot on lungs and the led to the increased depression.             Discharge Plan 1. Will you be returning to the same living situation after discharge?   Yes: No:      If no, what is your plan?    Yes. Patient plans tor return to his home.       2. Would you like a referral for services when you are discharged? Yes:     If yes, for what services?  No:       No. Patient reports having outpatient services at [facility].       Summary and Recommendations (to be completed by the evaluator) [name] is a 33 year old African American male admitted with Substance induced mood disorder.  He will benefit from crisis stabilization, evaluation for medication, psycho-education groups for coping skills development, group therapy and case management for discharge planning.                        Signature:  Concha Pyo, 08/03/2013 3:38 PM
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
## 2013-08-04 DIAGNOSIS — F259 Schizoaffective disorder, unspecified: Secondary | ICD-10-CM

## 2013-08-04 MED ORDER — BENZOCAINE 10 % MT GEL
Freq: Four times a day (QID) | OROMUCOSAL | Status: DC | PRN
  Filled 2013-08-04: qty 9.4

## 2013-08-04 MED ORDER — RAMELTEON 8 MG PO TABS
8.0000 mg | ORAL_TABLET | Freq: Every day | ORAL | Status: DC
  Administered 2013-08-04 – 2013-08-05 (×2): 8 mg via ORAL
  Filled 2013-08-04 (×3): qty 1
  Filled 2013-08-04: qty 7

## 2013-08-04 NOTE — Progress Notes (Signed)
Psychoeducational Group Note  Date:  08/04/2013 Time: 1015  Group Topic/Focus:  Identifying Needs:   The focus of this group is to help patients identify their personal needs that have been historically problematic and identify healthy behaviors to address their needs.  Participation Level:  Active  Participation Quality:  Appropriate  Affect:  Appropriate  Cognitive:  Appropriate  Insight:  Engaged  Engagement in Group:  Engaged  Additional Comments:    08/04/2013,3:33 PM Khandi Kernes, Trixie Rude
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
## 2013-08-04 NOTE — Progress Notes (Signed)
Did not attend group 

## 2013-08-04 NOTE — Progress Notes (Signed)
D) Pt has attended some of the groups today, but did lie on his bed for several hours stating that he has a headache and justt aches all over. States he feels better today than yesterday and wants to put this behind him. Affect is flat and mood depressed. Rates his depression and hopelessness both at a 1 and denies SI . States that he is receiving a lot of support from the staff and peers alike. A) Given support, reassurance and praise. Encouraged to go at his own pace due to increased pain, and to increase his activity slowly. R) Given support and reassurance.
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
## 2013-08-04 NOTE — B[facility] Group Notes (Signed)
[place] Group Notes: (Clinical Social Work)   08/04/2013      Type of Therapy:  Group Therapy   Participation Level:  Did Not Attend    Selmer Dominion, LCSW 08/04/2013, 4:32 PM

## 2013-08-05 NOTE — Progress Notes (Signed)
Psychoeducational Group Note  Date:  08/05/2013 Time:  1015  Group Topic/Focus:  Making Healthy Choices:   The focus of this group is to help patients identify negative/unhealthy choices they were using prior to admission and identify positive/healthier coping strategies to replace them upon discharge.  Participation Level:  Active  Participation Quality:  Appropriate  Affect:  Appropriate  Cognitive:  Oriented  Insight:  Engaged  Engagement in Group:  Improving  Additional Comments:    Ahlia Lemanski A 08/05/2013  
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
## 2013-08-05 NOTE — Progress Notes (Signed)
[place] Group Notes:  (Nursing/MHT/Case Management/Adjunct)  Date:  08/05/2013  Time:  11:51 PM  Type of Therapy:  Group Therapy  Participation Level:  Active  Participation Quality:  Appropriate  Affect:  Depressed  Cognitive:  Appropriate  Insight:  Lacking  Engagement in Group:  Engaged  Modes of Intervention:  Discussion  Summary of Progress/Problems:The patient expressed that he was really depressed with his health.The patient said that he needs to work on felling positive.  Nash Shearer 08/05/2013, 11:51 PM

## 2013-08-05 NOTE — Progress Notes (Signed)
[place] Group Notes:  (Nursing/MHT/Case Management/Adjunct)  Date:  08/05/2013  Time:  5:01 PM  Type of Therapy:  Psychoeducational Skills  Participation Level:  Minimal  Participation Quality:  Appropriate and Attentive  Affect:  Appropriate  Cognitive:  Appropriate  Insight:  Appropriate  Engagement in Group:  Engaged  Modes of Intervention:  Activity  Summary of Progress/Problems: Pts did an activity where they wrote down 5 unique qualities about themselves and had to guess who those qualities belonged to. Pts were able to share things they wouldn't normally have shared and learn about their peers. Pt stated did not share much during group, but did participate.  [name] 08/05/2013, 5:01 PM

## 2013-08-05 NOTE — BHH Group Notes (Signed)
[place] Group Notes: (Clinical Social Work)   08/05/2013      Type of Therapy:  Group Therapy   Participation Level:  Did Not Attend    Selmer Dominion, LCSW 08/05/2013, 4:47 PM

## 2013-08-05 NOTE — Progress Notes (Signed)
Patient ID: [name], male   DOB: 10-21-1980, 33 y.o.   MRN: [id number] [facility] MD Progress Note  08/05/2013 1:12 PM [name]  MRN:  [id number] Subjective:  Patient stated he had no depression today or suicidal ideations.  His sleep and appetite are "good".  Lip and tooth pain "much better". He requests to leave tomorrow because he feels his alcohol detox is complete and he wants to make his cancer appointment at [facility] tomorrow. Diagnosis:   DSM5:  Trauma-Stressor Disorders:  Posttraumatic Stress Disorder (309.81) Substance/Addictive Disorders:  Alcohol Related Disorder - Severe (303.90), Alcohol Intoxication with Use Disorder - Severe (F10.229) and Alcohol Withdrawal (291.81) Depressive Disorders:  Major Depressive Disorder - Severe (296.23)  Axis I: Alcohol Abuse, Anxiety Disorder NOS, Major Depression, Recurrent severe, Schizoaffective Disorder and Substance Abuse Axis II: Deferred Axis III:  Past Medical History  Diagnosis Date  . Seizures   . Hypertension   . Depression   . Pancreatitis   . Liver cirrhosis   . Coronary artery disease   . Angina   . Shortness of breath   . Headache(784.0)   . Peripheral vascular disease April 2011    Left Pop  . Hypercholesteremia   . Schizophrenia   . Bipolar 1 disorder   . Cancer of kidney   . Breast CA    Axis IV: other psychosocial or environmental problems, problems related to social environment and problems with primary support group Axis V: 41-50 serious symptoms  ADL's:  Intact  Sleep: Poor  Appetite:  Poor  Suicidal Ideation:  None Homicidal Ideation:  None   Psychiatric Specialty Exam: Review of Systems  Constitutional: Negative.   HENT:       Lip/mouth pain  Eyes: Negative.   Respiratory: Negative.   Cardiovascular: Negative.   Gastrointestinal: Negative.   Genitourinary: Negative.   Musculoskeletal: Negative.   Skin: Negative.   Neurological: Negative.   Endo/Heme/Allergies: Negative.    Psychiatric/Behavioral: Positive for substance abuse.    Blood pressure 123/86, pulse 88, temperature 97.9 F (36.6 C), temperature source Oral, resp. rate 20, height 5' 4.57" (1.64 m), weight 65.545 kg (144 lb 8 oz), SpO2 98.00%.Body mass index is 24.37 kg/(m^2).  General Appearance: Casual  Eye Contact::  Fair  Speech:  Normal  Volume:  Normal  Mood:  Euthymic  Affect:  Congruent  Thought Process:  Coherent  Orientation:  Full (Time, Place, and Person)  Thought Content:  WDL  Suicidal Thoughts:  No  Homicidal Thoughts:  No  Memory:  Immediate;   Fair Recent;   Fair Remote;   Fair  Judgement:  Fair  Insight:  Lacking  Psychomotor Activity:  Normal  Concentration:  Fair  Recall:  Fair  Akathisia:  No  Handed:  Right  AIMS (if indicated):     Assets:  Leisure Time Resilience  Sleep:  Number of Hours: 6   Current Medications: Current Facility-Administered Medications  Medication Dose Route Frequency Provider Last Rate Last Dose  . amLODipine (NORVASC) tablet 5 mg  5 mg Oral Daily Shuvon Rankin, NP   5 mg at 08/05/13 0825  . ARIPiprazole (ABILIFY) tablet 4 mg  4 mg Oral QHS Shuvon Rankin, NP   4 mg at 08/04/13 2122  . atorvastatin (LIPITOR) tablet 20 mg  20 mg Oral q1800 Durward Parcel, MD   20 mg at 08/04/13 1759  . benzocaine (ORAJEL) 10 % mucosal gel   Mouth/Throat QID PRN Lurena Nida, NP      .  chlordiazePOXIDE (LIBRIUM) capsule 25 mg  25 mg Oral Q6H PRN Shuvon Rankin, NP   25 mg at 08/03/13 2128  . chlordiazePOXIDE (LIBRIUM) capsule 25 mg  25 mg Oral BH-qamhs Shuvon Rankin, NP   25 mg at 08/05/13 0827   Followed by  . [START ON 08/06/2013] chlordiazePOXIDE (LIBRIUM) capsule 25 mg  25 mg Oral Daily Shuvon Rankin, NP      . DULoxetine (CYMBALTA) DR capsule 30 mg  30 mg Oral Daily Shuvon Rankin, NP   30 mg at 08/05/13 0825  . feeding supplement (ENSURE COMPLETE) (ENSURE COMPLETE) liquid 237 mL  237 mL Oral BID BM Darrol Jump, RD   237 mL at 08/05/13 1000  .  hydrOXYzine (ATARAX/VISTARIL) tablet 25 mg  25 mg Oral Q6H PRN Shuvon Rankin, NP   25 mg at 08/04/13 2124  . loperamide (IMODIUM) capsule 2-4 mg  2-4 mg Oral PRN Shuvon Rankin, NP      . magnesium hydroxide (MILK OF MAGNESIA) suspension 30 mL  30 mL Oral Daily PRN Shuvon Rankin, NP      . metoprolol tartrate (LOPRESSOR) tablet 25 mg  25 mg Oral BID Shuvon Rankin, NP   25 mg at 08/05/13 0825  . multivitamin with minerals tablet 1 tablet  1 tablet Oral Daily Shuvon Rankin, NP   1 tablet at 08/05/13 0825  . naproxen (NAPROSYN) tablet 500 mg  500 mg Oral TID WC Waylan Boga, NP   500 mg at 08/05/13 1207  . ondansetron (ZOFRAN-ODT) disintegrating tablet 4 mg  4 mg Oral Q6H PRN Shuvon Rankin, NP   4 mg at 08/03/13 2330  . ramelteon (ROZEREM) tablet 8 mg  8 mg Oral QHS Waylan Boga, NP   8 mg at 08/04/13 2122    Lab Results: No results found for this or any previous visit (from the past 48 hour(s)).  Physical Findings: AIMS: Facial and Oral Movements Muscles of Facial Expression: None, normal Lips and Perioral Area: None, normal Jaw: None, normal Tongue: None, normal,Extremity Movements Upper (arms, wrists, hands, fingers): None, normal Lower (legs, knees, ankles, toes): None, normal, Trunk Movements Neck, shoulders, hips: None, normal, Overall Severity Severity of abnormal movements (highest score from questions above): None, normal Incapacitation due to abnormal movements: None, normal [name] awareness of abnormal movements (rate only patient's report): No Awareness, Dental Status Current problems with teeth and/or dentures?: No Does patient usually wear dentures?: No  CIWA:  CIWA-Ar Total: 3 COWS:  COWS Total Score: 4  Treatment Plan Summary: Daily contact with patient to assess and evaluate symptoms and progress in treatment Medication management  Plan:  Review of chart, vital signs, medications, and notes. 1-Individual and group therapy 2-Medication management for depression and  anxiety:  Medications reviewed with the patient and he stated no untoward effects, no changes made 3-Coping skills for depression, anxiety, and substance dependency 4-Continue crisis stabilization and management 5-Address health issues--monitoring vital signs, stable 6-Treatment plan in progress to prevent relapse of depression, alcohol use, and anxiety  Medical Decision Making Problem Points:  Established problem, stable/improving (1) and Review of psycho-social stressors (1) Data Points:  Review of medication regiment & side effects (2)  I certify that inpatient services furnished can reasonably be expected to improve the patient's condition.   Waylan Boga, [place] 08/05/2013, 1:12 PM I agree with findings and treatment plan of this patient

## 2013-08-05 NOTE — Progress Notes (Signed)
D) Pt has been in bed much of the day sleeping except for his meals and medication time. Pt. Is pleasant on approach, and denies SI and HI. Talked about his illness of kidney cancer and having his kidney removed. Wants to be able to go to [facility] where his doctors are taking care of him pro bono.  A) Given support, reassurance and praise. Encouraged to go to the groups. R) Pt states he is hurting all over and just does not feel well therefore would rather sleep.

## 2013-08-05 NOTE — Progress Notes (Signed)
Psychoeducational Group Note  Date: 08/05/2013 Time:  0930  Group Topic/Focus:  Gratefulness:  The focus of this group is to help patients identify what two things they are most grateful for in their lives. What helps ground them and to center them on their work to their recovery.  Participation Level:  Did not attend  [name] A  

## 2013-08-06 MED ORDER — DULOXETINE HCL 30 MG PO CPEP: 30.0000 mg | ORAL_CAPSULE | Freq: Every day | ORAL | Status: DC

## 2013-08-06 MED ORDER — RAMELTEON 8 MG PO TABS: 8.0000 mg | ORAL_TABLET | Freq: Every day | ORAL | Status: DC

## 2013-08-06 MED ORDER — ARIPIPRAZOLE 2 MG PO TABS: 4.0000 mg | ORAL_TABLET | Freq: Every day | ORAL | Status: DC

## 2013-08-06 NOTE — BHH Suicide Risk Assessment (Signed)
[place] INPATIENT: Family/Significant Other Suicide Prevention Education   Suicide Prevention Education:  Education Completed; No one has been identified by the patient as the family member/significant other with whom the patient will be residing, and identified as the person(s) who will aid the patient in the event of a mental health crisis (suicidal ideations/suicide attempt).   Pt did not c/o SI at admission, nor have they endorsed SI during their stay here. SPE not required. SPI pamphlet provided to pt and he was encouraged to share information with his support network, ask questions, and talk about any concerns.   The suicide prevention education provided includes the following:  Suicide risk factors  Suicide prevention and interventions  National Suicide Hotline telephone number  [facility] assessment telephone number  [facility] Emergency Assistance [facility] and/or Residential Mobile Crisis Unit telephone number  Regan Lemming, [place] 08/06/2013  10:43 AM

## 2013-08-06 NOTE — Progress Notes (Signed)
D:  [name] self inventory sheet, patient has fair sleep, good appetite, low energy level, improving attention span.  Denied depression and hopeless.  Rated anxiety #10.  Denied SI.  Worst pain #5, zero pain goal.  Has appt with neurologist MD this afternoon.  Needs financial assistance for medications.  Plans discharge to boarding house. A:  Medications administered per MD orders.  Emotional support and encouragement given patient. R:  Denied SI and HI.  Denied A/V hallucinations.  Will continue to monitor patient with 15 minute checks.  Safety maintained. Would like to discharge home to go to MD appointment this afternoon.

## 2013-08-06 NOTE — BHH Suicide Risk Assessment (Signed)
Suicide Risk Assessment  Discharge Assessment     Demographic Factors:  Male, Adolescent or young adult, Low socioeconomic status and Unemployed  Mental Status Per Nursing Assessment::   On Admission:  NA  Current Mental Status by Physician: Mental Status Examination: Patient appeared as per his stated age, casually dressed, and fairly groomed, and maintaining good eye contact. Patient has good mood and his affect was constricted. He has normal rate, rhythm, and volume of speech. His thought process is linear and goal directed. Patient has denied suicidal, homicidal ideations, intentions or plans. Patient has no evidence of auditory or visual hallucinations, delusions, and paranoia. Patient has fair insight judgment and impulse control.  Loss Factors: Financial problems/change in socioeconomic status  Historical Factors: Prior suicide attempts, Family history of mental illness or substance abuse and Impulsivity  Risk Reduction Factors:   Sense of responsibility to family, Religious beliefs about death, Living with another person, especially a relative, Positive social support, Positive therapeutic relationship and Positive coping skills or problem solving skills  Continued Clinical Symptoms:  Depression:   Recent sense of peace/wellbeing Alcohol/Substance Abuse/Dependencies Previous Psychiatric Diagnoses and Treatments  Cognitive Features That Contribute To Risk:  Polarized thinking    Suicide Risk:  Minimal: No identifiable suicidal ideation.  Patients presenting with no risk factors but with morbid ruminations; may be classified as minimal risk based on the severity of the depressive symptoms  Discharge Diagnoses:   AXIS I:  Major Depression, Recurrent severe, Post Traumatic Stress Disorder and Alcohol dependence AXIS II:  Deferred AXIS III:   Past Medical History  Diagnosis Date  . Seizures   . Hypertension   . Depression   . Pancreatitis   . Liver cirrhosis   .  Coronary artery disease   . Angina   . Shortness of breath   . Headache(784.0)   . Peripheral vascular disease April 2011    Left Pop  . Hypercholesteremia   . Schizophrenia   . Bipolar 1 disorder   . Cancer of kidney   . Breast CA    AXIS IV:  other psychosocial or environmental problems, problems related to social environment and problems with primary support group AXIS V:  61-70 mild symptoms  Plan Of Care/Follow-up recommendations:  Activity:  As tolerated Diet:  Regular  Is patient on multiple antipsychotic therapies at discharge:  No   Has Patient had three or more failed trials of antipsychotic monotherapy by history:  No  Recommended Plan for Multiple Antipsychotic Therapies: NA  [name],[name] R. 08/06/2013, 1:13 PM

## 2013-08-06 NOTE — [facility] Group Notes (Signed)
[facility] LCSW Aftercare Discharge Planning Group Note   08/06/2013  8:45 AM  Participation Quality:  Did Not Attend - pt sleeping in their room  Isaak Delmundo Horton, LCSW 08/06/2013 9:44 AM        
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
## 2013-08-06 NOTE — Progress Notes (Signed)
Patient ID: [name], male   DOB: Feb 23, 1981, 33 y.o.   MRN: [id number] D)  Attended group this evening, was out on the hall at times, stated is feeling a little better, but has spent most of the day in bed.  Stated hasn't felt like being around people , wanted to rest today, hoping to be discharged in order to f/u at [facility] regarding cancer and spots on his lungs.  Affect is flat, sad, quiet, appreciative. A)  Will continue to monitor for safety, offer support and encouragement,contineu POC R)  Safety maintained.

## 2013-08-06 NOTE — Tx Team (Addendum)
Interdisciplinary Treatment Plan Update (Adult)  Date: 08/06/2013  Time Reviewed:  9:45 AM  Progress in Treatment: Attending groups: Yes Participating in groups:  Yes Taking medication as prescribed:  Yes Tolerating medication:  Yes Family/Significant othe contact made: CSW assessing  Patient understands diagnosis:  Yes Discussing patient identified problems/goals with staff:  Yes Medical problems stabilized or resolved:  Yes Denies suicidal/homicidal ideation: Yes Issues/concerns per patient self-inventory:  Yes Other:  New problem(s) identified: N/A  Discharge Plan or Barriers: Pt will follow up at [facility] for medication management and therapy  Reason for Continuation of Hospitalization: Stable to d/c  Comments: N/A  Estimated length of stay: D/C today  For review of initial/current patient goals, please see plan of care.  Attendees: Patient:  [name]  08/06/2013 10:18 AM   Family:     Physician:  Dr. Zorita Pang 08/06/2013 10:07 AM   Nursing:   Gaylan Gerold, RN 08/06/2013 10:07 AM   Clinical Social Worker:  Regan Lemming, LCSW 08/06/2013 10:07 AM   Other: Catalina Pizza, PA 08/06/2013 10:07 AM   Other:  Gala Romney, care coordination 08/06/2013 10:07 AM   Other:  Grayland Ormond, RN 08/06/2013 10:07 AM   Other:  Thurnell Garbe, RN 08/06/2013 10:07 AM   Other:    Other:    Other:    Other:    Other:    Other:     Scribe for Treatment Team:   Ane Payment, 08/06/2013 10:07 AM

## 2013-08-06 NOTE — Progress Notes (Addendum)
Discharge Note:  Patient discharged with family member.  Denied SI and HI.  Denied A/V hallucinations.  Denied pain.  Suicide prevention information given and discussed with patient who stated he understood and had no questions.  Patient received all his belongings, medications, prescriptions, clothing, toiletries, miscellaneous items,  phone, wig, jacket.  Patient stated he appreciated all assistance received from [facility] staff.

## 2013-08-06 NOTE — Progress Notes (Signed)
[facility] Adult Case Management Discharge Plan :  Will you be returning to the same living situation after discharge: Yes,  returning home At discharge, do you have transportation home?:Yes,  mom will pick pt up Do you have the ability to pay for your medications:Yes,  access to meds  Release of information consent forms completed and in the chart;  [name] signature needed at discharge.  Patient to Follow up at: Follow-up Information   Follow up with [facility] On 08/08/2013. (Please go to [facility]'s walk in clinic on Tuesday, August 08, 2013 or any weekday between [place])    Contact information:   [address] [place], [place]   [address]  [phone]      Patient denies SI/HI:   Yes,  denies SI/HI    Safety Planning and Suicide Prevention discussed:  Yes,  discussed with pt.  N/A to contact family/friend due to no SI on admission.  See suicide prevention education note.   Ane Payment 08/06/2013, 10:43 AM

## 2013-08-09 NOTE — Consult Note (Signed)
Patient personally evaluated and note agreed with

## 2013-08-10 NOTE — Progress Notes (Signed)
Patient Discharge Instructions:  After Visit Summary (AVS):   Faxed to:  08/10/13 Discharge Summary Note:   Faxed to:  08/10/13 Psychiatric Admission Assessment Note:   Faxed to:  08/10/13 Suicide Risk Assessment - Discharge Assessment:   Faxed to:  08/10/13 Faxed/Sent to the Next Level Care provider:  08/10/13 Faxed to [facility] @ [place], 08/10/2013, 2:31 PM

## 2013-09-19 ENCOUNTER — Encounter (HOSPITAL_COMMUNITY): Payer: Self-pay | Admitting: Emergency Medicine

## 2013-09-19 ENCOUNTER — Emergency Department (HOSPITAL_COMMUNITY)
Admission: EM | Admit: 2013-09-19 | Discharge: 2013-09-20 | Disposition: A | Discharge status: Home / Self Care | Payer: No Typology Code available for payment source | Attending: Emergency Medicine | Admitting: Emergency Medicine

## 2013-09-19 DIAGNOSIS — F1010 Alcohol abuse, uncomplicated: Secondary | ICD-10-CM | POA: Insufficient documentation

## 2013-09-19 DIAGNOSIS — Z880 Allergy status to penicillin: Secondary | ICD-10-CM | POA: Insufficient documentation

## 2013-09-19 DIAGNOSIS — Z853 Personal history of malignant neoplasm of breast: Secondary | ICD-10-CM | POA: Insufficient documentation

## 2013-09-19 DIAGNOSIS — F17200 Nicotine dependence, unspecified, uncomplicated: Secondary | ICD-10-CM | POA: Insufficient documentation

## 2013-09-19 DIAGNOSIS — I2510 Atherosclerotic heart disease of native coronary artery without angina pectoris: Secondary | ICD-10-CM | POA: Insufficient documentation

## 2013-09-19 DIAGNOSIS — F209 Schizophrenia, unspecified: Secondary | ICD-10-CM | POA: Insufficient documentation

## 2013-09-19 DIAGNOSIS — I10 Essential (primary) hypertension: Secondary | ICD-10-CM | POA: Insufficient documentation

## 2013-09-19 DIAGNOSIS — I739 Peripheral vascular disease, unspecified: Secondary | ICD-10-CM | POA: Insufficient documentation

## 2013-09-19 DIAGNOSIS — Z79899 Other long term (current) drug therapy: Secondary | ICD-10-CM | POA: Insufficient documentation

## 2013-09-19 DIAGNOSIS — E7800 Pure hypercholesterolemia, unspecified: Secondary | ICD-10-CM | POA: Insufficient documentation

## 2013-09-19 DIAGNOSIS — Z85528 Personal history of other malignant neoplasm of kidney: Secondary | ICD-10-CM | POA: Insufficient documentation

## 2013-09-19 DIAGNOSIS — F10929 Alcohol use, unspecified with intoxication, unspecified: Secondary | ICD-10-CM

## 2013-09-19 DIAGNOSIS — Z8719 Personal history of other diseases of the digestive system: Secondary | ICD-10-CM | POA: Insufficient documentation

## 2013-09-19 DIAGNOSIS — F319 Bipolar disorder, unspecified: Secondary | ICD-10-CM | POA: Insufficient documentation

## 2013-09-19 DIAGNOSIS — I209 Angina pectoris, unspecified: Secondary | ICD-10-CM | POA: Insufficient documentation

## 2013-09-19 LAB — COMPREHENSIVE METABOLIC PANEL
ALK PHOS: 53 U/L (ref 39–117)
ALT: 204 U/L — AB (ref 0–53)
AST: 210 U/L — ABNORMAL HIGH (ref 0–37)
Albumin: 3.2 g/dL — ABNORMAL LOW (ref 3.5–5.2)
BUN: 6 mg/dL (ref 6–23)
CALCIUM: 8.6 mg/dL (ref 8.4–10.5)
CO2: 20 meq/L (ref 19–32)
Chloride: 106 mEq/L (ref 96–112)
Creatinine, Ser: 0.82 mg/dL (ref 0.50–1.35)
GFR calc Af Amer: >90 mL/min (ref >90)
Glucose, Bld: 85 mg/dL (ref 70–99)
POTASSIUM: 3.9 meq/L (ref 3.7–5.3)
SODIUM: 143 meq/L (ref 137–147)
Total Bilirubin: <0.2 mg/dL — ABNORMAL LOW (ref 0.3–1.2)
Total Protein: 6.8 g/dL (ref 6.0–8.3)

## 2013-09-19 LAB — RAPID URINE DRUG SCREEN, HOSP PERFORMED
AMPHETAMINES: NOT DETECTED
Barbiturates: NOT DETECTED
Benzodiazepines: NOT DETECTED
Cocaine: NOT DETECTED
Opiates: NOT DETECTED
Tetrahydrocannabinol: NOT DETECTED

## 2013-09-19 LAB — CBC WITH DIFFERENTIAL/PLATELET
BASOS ABS: 0 10*3/uL (ref 0.0–0.1)
Basophils Relative: 0 % (ref 0–1)
Eosinophils Absolute: 0.1 10*3/uL (ref 0.0–0.7)
Eosinophils Relative: 2 % (ref 0–5)
HCT: 42.4 % (ref 39.0–52.0)
Hemoglobin: 14.9 g/dL (ref 13.0–17.0)
Lymphocytes Relative: 65 % — ABNORMAL HIGH (ref 12–46)
Lymphs Abs: 3 10*3/uL (ref 0.7–4.0)
MCH: 31.3 pg (ref 26.0–34.0)
MCHC: 35.1 g/dL (ref 30.0–36.0)
MCV: 89.1 fL (ref 78.0–100.0)
Monocytes Absolute: 0.4 10*3/uL (ref 0.1–1.0)
Monocytes Relative: 8 % (ref 3–12)
NEUTROS PCT: 25 % — AB (ref 43–77)
Neutro Abs: 1.1 10*3/uL — ABNORMAL LOW (ref 1.7–7.7)
Platelets: 164 10*3/uL (ref 150–400)
RBC: 4.76 MIL/uL (ref 4.22–5.81)
RDW: 14.4 % (ref 11.5–15.5)
WBC: 4.6 10*3/uL (ref 4.0–10.5)

## 2013-09-19 LAB — ETHANOL
Alcohol, Ethyl (B): 323 mg/dL — ABNORMAL HIGH (ref 0–11)
Alcohol, Ethyl (B): 263 mg/dL — ABNORMAL HIGH (ref 0–11)

## 2013-09-19 LAB — LIPASE, BLOOD: Lipase: 26 U/L (ref 11–59)

## 2013-09-19 MED ORDER — VITAMIN B-1 100 MG PO TABS
100.0000 mg | ORAL_TABLET | Freq: Every day | ORAL | Status: DC
  Administered 2013-09-19: 100 mg via ORAL
  Filled 2013-09-19: qty 1

## 2013-09-19 MED ORDER — THIAMINE HCL 100 MG/ML IJ SOLN: 100.0000 mg | Freq: Every day | INTRAMUSCULAR | Status: DC

## 2013-09-19 MED ORDER — SODIUM CHLORIDE 0.9 % IV BOLUS (SEPSIS)
1000.0000 mL | Freq: Once | INTRAVENOUS | Status: AC
  Administered 2013-09-19: 1000 mL via INTRAVENOUS

## 2013-09-19 MED ORDER — IBUPROFEN 200 MG PO TABS
600.0000 mg | ORAL_TABLET | Freq: Four times a day (QID) | ORAL | Status: DC | PRN
  Administered 2013-09-19: 600 mg via ORAL
  Filled 2013-09-19: qty 3

## 2013-09-19 MED ORDER — LORAZEPAM 1 MG PO TABS: 0.0000 mg | ORAL_TABLET | Freq: Two times a day (BID) | ORAL | Status: DC

## 2013-09-19 MED ORDER — LORAZEPAM 1 MG PO TABS
0.0000 mg | ORAL_TABLET | Freq: Four times a day (QID) | ORAL | Status: DC
  Administered 2013-09-19: 2 mg via ORAL
  Administered 2013-09-19: 1 mg via ORAL
  Filled 2013-09-19: qty 1
  Filled 2013-09-19: qty 2

## 2013-09-19 NOTE — [facility] Counselor (Signed)
Pt accepted to [facility] 304-2 Geralyn Flash to [place]

## 2013-09-19 NOTE — ED Provider Notes (Signed)
11:49 PM Patient accepted to [facility]. Dr. Sabra Heck is accepting.  [name] Hamburger, MD 09/19/13 [phone]
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
## 2013-09-19 NOTE — ED Provider Notes (Signed)
CSN: [id number]     Arrival date & time 09/19/13  0244 History   First MD Initiated Contact with Patient 09/19/13 0600     Chief Complaint  Patient presents with  . Alcohol Intoxication     (Consider location/radiation/quality/duration/timing/severity/associated sxs/prior Treatment) HPI Comments: Patient presents emergency department with chief complaint of alcohol intoxication. Patient reports drinking heavily last night. He is complaining of abdominal pain.  History is difficult to obtain 2/2 intoxication.  The history is provided by the patient. No language interpreter was used.    Past Medical History  Diagnosis Date  . Seizures   . Hypertension   . Depression   . Pancreatitis   . Liver cirrhosis   . Coronary artery disease   . Angina   . Shortness of breath   . Headache(784.0)   . Peripheral vascular disease April 2011    Left Pop  . Hypercholesteremia   . Schizophrenia   . Bipolar 1 disorder   . Cancer of kidney   . Breast CA    Past Surgical History  Procedure Laterality Date  . Chest surgery    . Left leg surgery    . Mastectomy    . Left kidney removal     Family History  Problem Relation Age of Onset  . Stroke Other    History  Substance Use Topics  . Smoking status: Current Every Day Smoker -- 0.10 packs/day for 4 years    Types: Cigarettes  . Smokeless tobacco: Never Used  . Alcohol Use: 4.8 oz/week    6 Glasses of wine, 2 Cans of beer per week     Comment: 2-3 bottles wine/2 40 oz beers daily    Review of Systems  Unable to perform ROS: Other      Allergies  Codeine; Depakote; Morphine; Penicillins; and Hydrocodone-acetaminophen  Home Medications   Current Outpatient Rx  Name  Route  Sig  Dispense  Refill  . amLODipine (NORVASC) 5 MG tablet   Oral   Take 1 tablet (5 mg total) by mouth daily. For hypertension management         . ARIPiprazole (ABILIFY) 2 MG tablet   Oral   Take 2 tablets (4 mg total) by mouth at bedtime.   60  tablet   0   . diphenhydrAMINE (BENADRYL) 25 MG tablet   Oral   Take 25 mg by mouth every 6 (six) hours as needed for itching.         . DULoxetine (CYMBALTA) 30 MG capsule   Oral   Take 1 capsule (30 mg total) by mouth daily.   30 capsule   0   . LORazepam (ATIVAN) 0.5 MG tablet   Oral   Take 0.5 mg by mouth every 6 (six) hours as needed for anxiety.          . metoprolol tartrate (LOPRESSOR) 25 MG tablet   Oral   Take 1 tablet (25 mg total) by mouth 2 (two) times daily. For high blood pressure control/management         . ondansetron (ZOFRAN ODT) 4 MG disintegrating tablet   Oral   Take 1 tablet (4 mg total) by mouth every 8 (eight) hours as needed for nausea or vomiting.   10 tablet   0   . promethazine (PHENERGAN) 25 MG suppository   Rectal   Place 1 suppository (25 mg total) rectally every 6 (six) hours as needed for nausea or vomiting.   12 each  0   . ramelteon (ROZEREM) 8 MG tablet   Oral   Take 1 tablet (8 mg total) by mouth at bedtime.   30 tablet   0   . simvastatin (ZOCOR) 40 MG tablet   Oral   Take 1 tablet (40 mg total) by mouth every evening. For high cholesterol control   30 tablet       BP 152/109  Pulse 103  Temp(Src) 98.6 F (37 C) (Oral)  Resp 20  SpO2 98% Physical Exam  Nursing note and vitals reviewed. Constitutional: He is oriented to person, place, and time. He appears well-developed and well-nourished.  HENT:  Head: Normocephalic and atraumatic.  Eyes: Conjunctivae and EOM are normal. Pupils are equal, round, and reactive to light. Right eye exhibits no discharge. Left eye exhibits no discharge. No scleral icterus.  Neck: Normal range of motion. Neck supple. No JVD present.  Cardiovascular: Normal rate, regular rhythm and normal heart sounds.  Exam reveals no gallop and no friction rub.   No murmur heard. Pulmonary/Chest: Effort normal and breath sounds normal. No respiratory distress. He has no wheezes. He has no rales. He  exhibits no tenderness.  Abdominal: Soft. He exhibits no distension and no mass. There is no tenderness. There is no rebound and no guarding.  Musculoskeletal: Normal range of motion. He exhibits no edema and no tenderness.  Neurological: He is alert and oriented to person, place, and time.  Skin: Skin is warm and dry.  No lacerations  Psychiatric: He has a normal mood and affect. His behavior is normal. Judgment and thought content normal.    ED Course  Procedures (including critical care time) Results for orders placed during the hospital encounter of 09/19/13  CBC WITH DIFFERENTIAL      Result Value Ref Range   WBC 4.6  4.0 - 10.5 K/uL   RBC 4.76  4.22 - 5.81 MIL/uL   Hemoglobin 14.9  13.0 - 17.0 g/dL   HCT 42.4  39.0 - 52.0 %   MCV 89.1  78.0 - 100.0 fL   MCH 31.3  26.0 - 34.0 pg   MCHC 35.1  30.0 - 36.0 g/dL   RDW 14.4  11.5 - 15.5 %   Platelets 164  150 - 400 K/uL   Neutrophils Relative % 25 (*) 43 - 77 %   Neutro Abs 1.1 (*) 1.7 - 7.7 K/uL   Lymphocytes Relative 65 (*) 12 - 46 %   Lymphs Abs 3.0  0.7 - 4.0 K/uL   Monocytes Relative 8  3 - 12 %   Monocytes Absolute 0.4  0.1 - 1.0 K/uL   Eosinophils Relative 2  0 - 5 %   Eosinophils Absolute 0.1  0.0 - 0.7 K/uL   Basophils Relative 0  0 - 1 %   Basophils Absolute 0.0  0.0 - 0.1 K/uL  COMPREHENSIVE METABOLIC PANEL      Result Value Ref Range   Sodium 143  137 - 147 mEq/L   Potassium 3.9  3.7 - 5.3 mEq/L   Chloride 106  96 - 112 mEq/L   CO2 20  19 - 32 mEq/L   Glucose, Bld 85  70 - 99 mg/dL   BUN 6  6 - 23 mg/dL   Creatinine, Ser 0.82  0.50 - 1.35 mg/dL   Calcium 8.6  8.4 - 10.5 mg/dL   Total Protein 6.8  6.0 - 8.3 g/dL   Albumin 3.2 (*) 3.5 - 5.2 g/dL   AST  210 (*) 0 - 37 U/L   ALT 204 (*) 0 - 53 U/L   Alkaline Phosphatase 53  39 - 117 U/L   Total Bilirubin <0.2 (*) 0.3 - 1.2 mg/dL   GFR calc non Af Amer >90  >90 mL/min   GFR calc Af Amer >90  >90 mL/min  LIPASE, BLOOD      Result Value Ref Range   Lipase 26   11 - 59 U/L  ETHANOL      Result Value Ref Range   Alcohol, Ethyl (B) 323 (*) 0 - 11 mg/dL  ETHANOL      Result Value Ref Range   Alcohol, Ethyl (B) 263 (*) 0 - 11 mg/dL   No results found.    MDM   Final diagnoses:  None    9:55 AM Patient is requesting food.  10:14 AM Labs and workup discussed with Dr. Maryan Rued, who recommends continuing fluids.  Follow-up with PCP.  Stop drinking.  Don't use tylenol.  Discharge to home once sober.  2:16 PM Patient now requesting to stay for detox.  Will move to psych ED.  He is medically clear.  Montine Circle, PA-C 09/19/13 [phone]

## 2013-09-19 NOTE — BHH Counselor (Signed)
Writer faxed referral information to [facility].  Writer spoke with Cici who confirmed the referral had been received.  Helaine Chess, MSW, LCSW, Best boy
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
## 2013-09-19 NOTE — ED Notes (Signed)
Pt is up and down to the nurses station, stating he's tired but not laying down
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
## 2013-09-19 NOTE — Progress Notes (Signed)
[facility] provided pt with a list of primary care resources. Patient stated pending insurance.

## 2013-09-19 NOTE — ED Notes (Signed)
Pt noted to have a lac to his L knee.

## 2013-09-20 ENCOUNTER — Encounter (HOSPITAL_COMMUNITY): Payer: Self-pay | Admitting: *Deleted

## 2013-09-20 ENCOUNTER — Inpatient Hospital Stay (HOSPITAL_COMMUNITY)
Admission: AD | Admit: 2013-09-20 | Discharge: 2013-09-22 | DRG: 885 | Disposition: A | Discharge status: Other Acute Inpatient Hospital | Payer: Federal, State, Local not specified - Other | Source: Intra-hospital | Attending: Psychiatry | Admitting: Psychiatry

## 2013-09-20 DIAGNOSIS — F1994 Other psychoactive substance use, unspecified with psychoactive substance-induced mood disorder: Secondary | ICD-10-CM

## 2013-09-20 DIAGNOSIS — F4310 Post-traumatic stress disorder, unspecified: Secondary | ICD-10-CM | POA: Diagnosis present

## 2013-09-20 DIAGNOSIS — F339 Major depressive disorder, recurrent, unspecified: Secondary | ICD-10-CM | POA: Diagnosis present

## 2013-09-20 DIAGNOSIS — F10939 Alcohol use, unspecified with withdrawal, unspecified: Secondary | ICD-10-CM

## 2013-09-20 DIAGNOSIS — F332 Major depressive disorder, recurrent severe without psychotic features: Principal | ICD-10-CM | POA: Diagnosis present

## 2013-09-20 DIAGNOSIS — F1410 Cocaine abuse, uncomplicated: Secondary | ICD-10-CM

## 2013-09-20 DIAGNOSIS — I10 Essential (primary) hypertension: Secondary | ICD-10-CM | POA: Diagnosis present

## 2013-09-20 DIAGNOSIS — F1420 Cocaine dependence, uncomplicated: Secondary | ICD-10-CM | POA: Diagnosis present

## 2013-09-20 DIAGNOSIS — F17200 Nicotine dependence, unspecified, uncomplicated: Secondary | ICD-10-CM | POA: Diagnosis present

## 2013-09-20 DIAGNOSIS — F1910 Other psychoactive substance abuse, uncomplicated: Secondary | ICD-10-CM | POA: Diagnosis present

## 2013-09-20 DIAGNOSIS — R45851 Suicidal ideations: Secondary | ICD-10-CM

## 2013-09-20 DIAGNOSIS — F1020 Alcohol dependence, uncomplicated: Secondary | ICD-10-CM | POA: Diagnosis present

## 2013-09-20 DIAGNOSIS — F10239 Alcohol dependence with withdrawal, unspecified: Secondary | ICD-10-CM

## 2013-09-20 DIAGNOSIS — F1014 Alcohol abuse with alcohol-induced mood disorder: Secondary | ICD-10-CM | POA: Diagnosis present

## 2013-09-20 DIAGNOSIS — Z79899 Other long term (current) drug therapy: Secondary | ICD-10-CM

## 2013-09-20 DIAGNOSIS — F640 Transsexualism: Secondary | ICD-10-CM | POA: Diagnosis present

## 2013-09-20 LAB — HIV ANTIBODY (ROUTINE TESTING W REFLEX): HIV: NONREACTIVE

## 2013-09-20 MED ORDER — AMLODIPINE BESYLATE 5 MG PO TABS
5.0000 mg | ORAL_TABLET | Freq: Every day | ORAL | Status: DC
  Administered 2013-09-20 – 2013-09-22 (×3): 5 mg via ORAL
  Filled 2013-09-20 (×6): qty 1

## 2013-09-20 MED ORDER — DIPHENHYDRAMINE HCL 25 MG PO CAPS
25.0000 mg | ORAL_CAPSULE | Freq: Four times a day (QID) | ORAL | Status: DC | PRN
  Administered 2013-09-20 – 2013-09-21 (×2): 25 mg via ORAL
  Filled 2013-09-20 (×2): qty 1

## 2013-09-20 MED ORDER — LORAZEPAM 0.5 MG PO TABS
0.5000 mg | ORAL_TABLET | Freq: Four times a day (QID) | ORAL | Status: DC | PRN
  Administered 2013-09-20: 0.5 mg via ORAL
  Filled 2013-09-20: qty 1

## 2013-09-20 MED ORDER — METOPROLOL TARTRATE 25 MG PO TABS
25.0000 mg | ORAL_TABLET | Freq: Two times a day (BID) | ORAL | Status: DC
  Administered 2013-09-20 – 2013-09-22 (×5): 25 mg via ORAL
  Filled 2013-09-20 (×10): qty 1

## 2013-09-20 MED ORDER — ADULT MULTIVITAMIN W/MINERALS CH
1.0000 | ORAL_TABLET | Freq: Every day | ORAL | Status: DC
  Administered 2013-09-20 – 2013-09-21 (×2): 1 via ORAL
  Filled 2013-09-20 (×6): qty 1

## 2013-09-20 MED ORDER — LOPERAMIDE HCL 2 MG PO CAPS
2.0000 mg | ORAL_CAPSULE | ORAL | Status: DC | PRN
  Administered 2013-09-20: 4 mg via ORAL
  Filled 2013-09-20: qty 2

## 2013-09-20 MED ORDER — DULOXETINE HCL 30 MG PO CPEP
30.0000 mg | ORAL_CAPSULE | Freq: Every day | ORAL | Status: DC
  Administered 2013-09-20 – 2013-09-21 (×2): 30 mg via ORAL
  Filled 2013-09-20 (×5): qty 1

## 2013-09-20 MED ORDER — ALUM & MAG HYDROXIDE-SIMETH 200-200-20 MG/5ML PO SUSP: 30.0000 mL | ORAL | Status: DC | PRN

## 2013-09-20 MED ORDER — TRAZODONE HCL 50 MG PO TABS
50.0000 mg | ORAL_TABLET | Freq: Every evening | ORAL | Status: DC | PRN
  Administered 2013-09-20 (×3): 50 mg via ORAL
  Filled 2013-09-20 (×7): qty 1

## 2013-09-20 MED ORDER — ONDANSETRON 4 MG PO TBDP
4.0000 mg | ORAL_TABLET | Freq: Four times a day (QID) | ORAL | Status: DC | PRN
  Administered 2013-09-20 (×2): 4 mg via ORAL
  Filled 2013-09-20 (×2): qty 1

## 2013-09-20 MED ORDER — INFLUENZA VAC SPLIT QUAD 0.5 ML IM SUSP
0.5000 mL | INTRAMUSCULAR | Status: DC
  Filled 2013-09-20: qty 0.5

## 2013-09-20 MED ORDER — ONDANSETRON HCL 4 MG PO TABS: 4.0000 mg | ORAL_TABLET | Freq: Three times a day (TID) | ORAL | Status: DC | PRN

## 2013-09-20 MED ORDER — HYDROXYZINE HCL 25 MG PO TABS
25.0000 mg | ORAL_TABLET | Freq: Four times a day (QID) | ORAL | Status: DC | PRN
  Administered 2013-09-20 – 2013-09-22 (×3): 25 mg via ORAL
  Filled 2013-09-20 (×3): qty 1

## 2013-09-20 MED ORDER — VITAMIN B-1 100 MG PO TABS
100.0000 mg | ORAL_TABLET | Freq: Every day | ORAL | Status: DC
  Administered 2013-09-21: 100 mg via ORAL
  Filled 2013-09-20 (×4): qty 1

## 2013-09-20 MED ORDER — LORAZEPAM 1 MG PO TABS
2.0000 mg | ORAL_TABLET | Freq: Four times a day (QID) | ORAL | Status: DC | PRN
Start: 2013-09-20 — End: 2013-09-22
  Administered 2013-09-20 – 2013-09-22 (×6): 2 mg via ORAL
  Filled 2013-09-20 (×6): qty 2

## 2013-09-20 MED ORDER — NICOTINE 21 MG/24HR TD PT24
21.0000 mg | MEDICATED_PATCH | Freq: Every day | TRANSDERMAL | Status: DC
  Filled 2013-09-20 (×2): qty 1

## 2013-09-20 MED ORDER — THIAMINE HCL 100 MG/ML IJ SOLN: 100.0000 mg | Freq: Once | INTRAMUSCULAR | Status: DC

## 2013-09-20 MED ORDER — ARIPIPRAZOLE 2 MG PO TABS
4.0000 mg | ORAL_TABLET | Freq: Every day | ORAL | Status: DC
  Administered 2013-09-20 (×2): 4 mg via ORAL
  Filled 2013-09-20 (×5): qty 2

## 2013-09-20 MED ORDER — MAGNESIUM HYDROXIDE 400 MG/5ML PO SUSP: 30.0000 mL | Freq: Every day | ORAL | Status: DC | PRN

## 2013-09-20 MED ORDER — SIMVASTATIN 40 MG PO TABS
40.0000 mg | ORAL_TABLET | Freq: Every evening | ORAL | Status: DC
  Administered 2013-09-20 – 2013-09-21 (×2): 40 mg via ORAL
  Filled 2013-09-20 (×4): qty 1
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
## 2013-09-20 NOTE — Progress Notes (Signed)
Pt has been in bed most of the day.  He came to medication window for morning medications and went back to bed.  He rated all his depression, hopelessness, and anxiety a 4 on his self-inventory.  He denied any A/V/H.  Pt has passive S/I no plan here in hospital and does contract to come to staff.  His CIWA was a 6. He wants to go to in-patient long-term treatment.
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
## 2013-09-20 NOTE — ED Notes (Signed)
No acute distress noted.

## 2013-09-20 NOTE — Progress Notes (Signed)
D:PAtient in his room on approach.  Patient states he continues to have withdrawal symptoms.  Patient states he knows he need to do better.  Patient states he is depressed and worried about not sleeping.  Patient denies SI/HI and denies AVH. A: Staff to monitor Q 15 mins for safety.  Encouragement and support offered.  Scheduled medications administered per orders. Ativan administered prn withdrawals.  Zofran administered prn for nausea.  Benadryl administered prn for itching. R: Patient remains safe on the unit.  Patient attended group tonight.  Patient visible on the unit.  Patient taking administered medications.
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
## 2013-09-20 NOTE — BHH Group Notes (Signed)
[facility] LCSW Group Therapy  09/20/2013 3:35 PM  Type of Therapy:  Group Therapy  Participation Level:  Did Not Attend-pt in bed sleeping/did not attend afternoon group.   Smart, Izekiel Flegel LCSWA  09/20/2013, 3:35 PM

## 2013-09-20 NOTE — Progress Notes (Signed)
Recreation Therapy Notes  Animal-Assisted Activity/Therapy (AAA/T) Program Checklist/Progress Notes Patient Eligibility Criteria Checklist & Daily Group note for Rec Tx Intervention  Date: 03.05.2015 Time: 2:45pm Location: [address]   AAA/T Program Assumption of Risk Form signed by Patient/ or Parent Legal Guardian yes  Patient is free of allergies or sever asthma yes  Patient reports no fear of animals yes  Patient reports no history of cruelty to animals yes   Patient understands his/her participation is voluntary yes  Behavioral Response: Did not attend.    Laureen Ochs Lolly Glaus, LRT/CTRS  Casmira Cramer L 09/20/2013 8:22 PM

## 2013-09-20 NOTE — Progress Notes (Signed)
The focus of this group is to educate the patient on the purpose and policies of crisis stabilization and provide a format to answer questions about their admission.  The group details unit policies and expectations of patients while admitted.   Pt did not attend this group. 

## 2013-09-21 DIAGNOSIS — F411 Generalized anxiety disorder: Secondary | ICD-10-CM

## 2013-09-21 MED ORDER — ARIPIPRAZOLE 5 MG PO TABS
5.0000 mg | ORAL_TABLET | Freq: Every day | ORAL | Status: DC
  Administered 2013-09-21: 5 mg via ORAL
  Filled 2013-09-21 (×3): qty 1

## 2013-09-21 MED ORDER — IBUPROFEN 600 MG PO TABS
600.0000 mg | ORAL_TABLET | Freq: Four times a day (QID) | ORAL | Status: DC | PRN
  Administered 2013-09-22: 600 mg via ORAL
  Filled 2013-09-21: qty 1

## 2013-09-21 MED ORDER — DULOXETINE HCL 60 MG PO CPEP
60.0000 mg | ORAL_CAPSULE | Freq: Every day | ORAL | Status: DC
  Administered 2013-09-22: 60 mg via ORAL
  Filled 2013-09-21 (×3): qty 1

## 2013-09-21 MED ORDER — ENSURE COMPLETE PO LIQD
237.0000 mL | Freq: Two times a day (BID) | ORAL | Status: DC
Start: 2013-09-21 — End: 2013-09-22
  Administered 2013-09-21 – 2013-09-22 (×2): 237 mL via ORAL

## 2013-09-21 MED ORDER — MIRTAZAPINE 15 MG PO TABS
15.0000 mg | ORAL_TABLET | Freq: Every day | ORAL | Status: DC
Start: 2013-09-21 — End: 2013-09-22
  Administered 2013-09-21: 15 mg via ORAL
  Filled 2013-09-21 (×3): qty 1

## 2013-09-21 MED ORDER — BUSPIRONE HCL 15 MG PO TABS
7.5000 mg | ORAL_TABLET | Freq: Two times a day (BID) | ORAL | Status: DC
  Administered 2013-09-21 – 2013-09-22 (×2): 7.5 mg via ORAL
  Filled 2013-09-21 (×6): qty 1

## 2013-09-21 NOTE — Progress Notes (Signed)
Pt has rested on and off this evening. Given vistaril at 0150 with little effect. Plans to speak with Dr. Sabra Heck in the morning regarding meds. No other complaints. No distress. He remains safe under level III obs. Jamie Kato

## 2013-09-21 NOTE — Progress Notes (Signed)
[facility] MD Progress Note  09/21/2013 5:41 PM [name]  MRN:  [id number] Subjective:  [name] continues to have a hard time. He is feeling very depressed, anxious, and experienced some hallucinations that he says he usually experiences when he is detoxing. Was wanting his medications adjusted or changed as states he does not feel they are working as well any more. Admits that the withdrawals keep getting harder and harder. He was at [facility] 2 years ago and would like to go there again. Diagnosis:   DSM5: Schizophrenia Disorders:  none Obsessive-Compulsive Disorders:  none Trauma-Stressor Disorders:  Posttraumatic Stress Disorder (309.81) Substance/Addictive Disorders:  Alcohol Related Disorder - Severe (303.90), Cocaine related disorder Depressive Disorders:  Major Depressive Disorder - Severe (296.23) Total Time spent with patient: 30 minutes  Axis I: Generalized Anxiety Disorder and Substance Induced Mood Disorder  ADL's:  Intact  Sleep: Poor  Appetite:  Poor  Suicidal Ideation:  Plan:  denies Intent:  denies Means:  denies Homicidal Ideation:  Plan:  denies Intent:  denies Means:  denies AEB (as evidenced by):  Psychiatric Specialty Exam: Physical Exam  Review of Systems  Constitutional: Positive for malaise/fatigue.  HENT: Negative.   Respiratory: Negative.   Cardiovascular: Negative.   Gastrointestinal: Negative.   Genitourinary: Negative.   Musculoskeletal: Positive for myalgias.  Skin: Negative.   Neurological: Positive for weakness.  Endo/Heme/Allergies: Negative.   Psychiatric/Behavioral: Positive for depression, hallucinations and substance abuse. The patient is nervous/anxious.     Blood pressure 126/100, pulse 99, temperature 97.8 F (36.6 C), temperature source Oral, resp. rate 16, height 5\' 7"  (1.702 m), weight 72.122 kg (159 lb).Body mass index is 24.9 kg/(m^2).  General Appearance: Fairly Groomed  Engineer, water::  Fair  Speech:  Clear and Coherent  Volume:   Decreased  Mood:  Anxious and Depressed  Affect:  Restricted  Thought Process:  Coherent and Goal Directed  Orientation:  Full (Time, Place, and Person)  Thought Content:  sympotms, worries, concerns  Suicidal Thoughts:  No  Homicidal Thoughts:  No  Memory:  Immediate;   Fair Recent;   Fair Remote;   Fair  Judgement:  Fair  Insight:  Present  Psychomotor Activity:  Restlessness  Concentration:  Fair  Recall:  [employer] [place]: Fair  Akathisia:  No  Handed:    AIMS (if indicated):     Assets:  Desire for Improvement  Sleep:  Number of Hours: 2.5   Musculoskeletal: Strength & Muscle Tone: within normal limits Gait & Station: normal Patient leans: N/A  Current Medications: Current Facility-Administered Medications  Medication Dose Route Frequency Provider Last Rate Last Dose  . alum & mag hydroxide-simeth (MAALOX/MYLANTA) 200-200-20 MG/5ML suspension 30 mL  30 mL Oral PRN Shuvon Rankin, NP      . alum & mag hydroxide-simeth (MAALOX/MYLANTA) 200-200-20 MG/5ML suspension 30 mL  30 mL Oral Q4H PRN Laverle Hobby, PA-C      . amLODipine (NORVASC) tablet 5 mg  5 mg Oral Daily Laverle Hobby, PA-C   5 mg at 09/21/13 0836  . ARIPiprazole (ABILIFY) tablet 5 mg  5 mg Oral QHS Nicholaus Bloom, MD      . busPIRone (BUSPAR) tablet 7.5 mg  7.5 mg Oral BID Nicholaus Bloom, MD      . diphenhydrAMINE (BENADRYL) capsule 25 mg  25 mg Oral Q6H PRN Laverle Hobby, PA-C   25 mg at 09/20/13 2120  . [START ON 09/22/2013] DULoxetine (CYMBALTA) DR capsule 60  mg  60 mg Oral Daily Nicholaus Bloom, MD      . feeding supplement (ENSURE COMPLETE) (ENSURE COMPLETE) liquid 237 mL  237 mL Oral BID BM Darrol Jump, RD      . hydrOXYzine (ATARAX/VISTARIL) tablet 25 mg  25 mg Oral Q6H PRN Laverle Hobby, PA-C   25 mg at 09/21/13 0151  . influenza vac split quadrivalent PF (FLUARIX) injection 0.5 mL  0.5 mL Intramuscular Tomorrow-1000 Nicholaus Bloom, MD      . loperamide (IMODIUM) capsule 2-4  mg  2-4 mg Oral PRN Laverle Hobby, PA-C   4 mg at 09/20/13 0139  . LORazepam (ATIVAN) tablet 2 mg  2 mg Oral Q6H PRN Laverle Hobby, PA-C   2 mg at 09/21/13 1539  . magnesium hydroxide (MILK OF MAGNESIA) suspension 30 mL  30 mL Oral Daily PRN Laverle Hobby, PA-C      . metoprolol tartrate (LOPRESSOR) tablet 25 mg  25 mg Oral BID Laverle Hobby, PA-C   25 mg at 09/21/13 0836  . mirtazapine (REMERON) tablet 15 mg  15 mg Oral QHS Nicholaus Bloom, MD      . multivitamin with minerals tablet 1 tablet  1 tablet Oral Daily Laverle Hobby, PA-C   1 tablet at 09/21/13 [phone]  . ondansetron (ZOFRAN) tablet 4 mg  4 mg Oral Q8H PRN Shuvon Rankin, NP      . ondansetron (ZOFRAN-ODT) disintegrating tablet 4 mg  4 mg Oral Q6H PRN Laverle Hobby, PA-C   4 mg at 09/20/13 1730  . simvastatin (ZOCOR) tablet 40 mg  40 mg Oral QPM Laverle Hobby, PA-C   40 mg at 09/20/13 1730  . thiamine (B-1) injection 100 mg  100 mg Intramuscular Once Spencer E Simon, PA-C      . thiamine (VITAMIN B-1) tablet 100 mg  100 mg Oral Daily Laverle Hobby, PA-C   100 mg at 09/21/13 7619    Lab Results:  Results for orders placed during the hospital encounter of 09/19/13 (from the past 82 hour(s))  HIV ANTIBODY (ROUTINE TESTING)     Status: None   Collection Time    09/19/13  6:13 PM      Result Value Ref Range   HIV NON REACTIVE  NON REACTIVE   Comment: Performed at [facility] ([facility])     Status: None   Collection Time    09/19/13  6:35 PM      Result Value Ref Range   Opiates NONE DETECTED  NONE DETECTED   Cocaine NONE DETECTED  NONE DETECTED   Benzodiazepines NONE DETECTED  NONE DETECTED   Amphetamines NONE DETECTED  NONE DETECTED   Tetrahydrocannabinol NONE DETECTED  NONE DETECTED   Barbiturates NONE DETECTED  NONE DETECTED   Comment:            DRUG SCREEN FOR MEDICAL PURPOSES     ONLY.  IF CONFIRMATION IS NEEDED     FOR ANY PURPOSE, NOTIFY LAB     WITHIN 5 DAYS.                 LOWEST DETECTABLE LIMITS     FOR URINE DRUG SCREEN     Drug Class       Cutoff (ng/mL)     Amphetamine      1000     Barbiturate      200  Benzodiazepine   [id number]     Tricyclics       [id number]     Opiates          300     Cocaine          300     THC              50    Physical Findings: AIMS: Facial and Oral Movements Muscles of Facial Expression: None, normal Lips and Perioral Area: None, normal Jaw: None, normal Tongue: None, normal,Extremity Movements Upper (arms, wrists, hands, fingers): None, normal Lower (legs, knees, ankles, toes): None, normal, Trunk Movements Neck, shoulders, hips: None, normal, Overall Severity Severity of abnormal movements (highest score from questions above): None, normal Incapacitation due to abnormal movements: None, normal [name] awareness of abnormal movements (rate only patient's report): No Awareness, Dental Status Current problems with teeth and/or dentures?: No Does patient usually wear dentures?: No  CIWA:  CIWA-Ar Total: 4 COWS:     Treatment Plan Summary: Daily contact with patient to assess and evaluate symptoms and progress in treatment Medication management  Plan: Supportive approach/coping skills/relapse prevention           Conitnue detox           Increase the Cymbalta to 60 mg                                   Abilify to 5 mg           D/C the Trazodone           Trial with Remeron 15 mg HS  Medical Decision Making Problem Points:  Established problem, worsening (2) and Review of psycho-social stressors (1) Data Points:  Review of medication regiment & side effects (2) Review of new medications or change in dosage (2)  I certify that inpatient services furnished can reasonably be expected to improve the patient's condition.   [place] A 09/21/2013, 5:41 PM

## 2013-09-21 NOTE — BHH Group Notes (Signed)
[place] LCSW Group Therapy  09/21/2013 1:15 PM   Type of Therapy:  Group Therapy  Participation Level:  Did Not Attend - pt sleeping in his room  Regan Lemming, [place] 09/21/2013 3:26 PM

## 2013-09-21 NOTE — Progress Notes (Signed)
Adult Psychoeducational Group Note  Date:  09/21/2013 Time:  11:33 AM  Group Topic/Focus:  Relapse Prevention Planning:   The focus of this group is to define relapse and discuss the need for planning to combat relapse.  Participation Level:  Did Not Attend   Joneen Caraway 09/21/2013, 11:33 AM

## 2013-09-21 NOTE — Tx Team (Signed)
Interdisciplinary Treatment Plan Update (Adult)  Date: 09/21/2013  Time Reviewed:  9:45 AM  Progress in Treatment: Attending groups: Yes Participating in groups:  Yes Taking medication as prescribed:  Yes Tolerating medication:  Yes Family/Significant othe contact made: CSW assessing Patient understands diagnosis:  Yes Discussing patient identified problems/goals with staff:  Yes Medical problems stabilized or resolved:  Yes Denies suicidal/homicidal ideation: Yes Issues/concerns per patient self-inventory:  Yes Other:  New problem(s) identified: N/A  Discharge Plan or Barriers: CSW assessing for appropriate referrals.    Reason for Continuation of Hospitalization: Anxiety Depression Medication Stabilization Detox  Comments: N/A  Estimated length of stay: 3-5 days  For review of initial/current patient goals, please see plan of care.  Attendees: Patient:     Family:     Physician:  Dr. Sabra Heck 09/21/2013 10:23 AM   Nursing:   Drake Leach, RN 09/21/2013 10:23 AM   Clinical Social Worker:  Regan Lemming, LCSW 09/21/2013 10:23 AM   Other: Lars Pinks, RN case manager 09/21/2013 10:23 AM   Other:  Maxie Better, Leslie 09/21/2013 10:23 AM   Other:  Agustina Caroli, NP 09/21/2013 10:23 AM   Other:  Norberto Sorenson, care coordination 09/21/2013 10:23 AM   Other:    Other:    Other:    Other:    Other:    Other:     Scribe for Treatment Team:   Ane Payment, 09/21/2013 , 10:23 AM

## 2013-09-21 NOTE — BHH Group Notes (Signed)
Adult Psychoeducational Group Note  Date:  09/21/2013 Time:  10:44 PM  Group Topic/Focus:  AA Meeting  Participation Level:  Minimal  Participation Quality:  Appropriate  Affect:  Appropriate  Cognitive:  Appropriate  Insight: Good  Engagement in Group:  Limited  Modes of Intervention:  Discussion and Education  Additional Comments:  [name] was attentive and spoke a few times during group.  He had minimal participation.  [name] A 09/21/2013, 10:44 PM

## 2013-09-21 NOTE — Progress Notes (Signed)
NUTRITION ASSESSMENT  Pt identified as at risk on the Malnutrition Screen Tool  INTERVENTION: 1. Educated patient on the importance of nutrition and encouraged intake of food and beverages. 2. Discussed weight goals. 3. Supplements: Ensure Complete po BID, each supplement provides 350 kcal and 13 grams of protein And MVI and thiamine daily.  NUTRITION DIAGNOSIS: Unintentional weight loss related to sub-optimal intake as evidenced by pt report.   Goal: Pt to meet >/= 90% of their estimated nutrition needs.  Monitor:  PO intake  Assessment:  Patient reports poor appetite and intake currently and prior to admit.  UBW 140 lbs.  Patient currently weighs more with recent weight gain.  33 y.o. male  Height: Ht Readings from Last 1 Encounters:  09/20/13 5\' 7"  (1.702 m)    Weight: Wt Readings from Last 1 Encounters:  09/20/13 159 lb (72.122 kg)    Weight Hx: Wt Readings from Last 10 Encounters:  09/20/13 159 lb (72.122 kg)  08/02/13 144 lb 8 oz (65.545 kg)  04/27/13 168 lb (76.204 kg)  04/18/13 160 lb (72.576 kg)  03/28/13 173 lb (78.472 kg)  01/03/13 185 lb 3.2 oz (84.006 kg)  01/02/13 185 lb 3.2 oz (84.006 kg)  12/24/12 184 lb 1.4 oz (83.5 kg)  12/04/12 170 lb (77.111 kg)  11/22/12 183 lb (83.008 kg)    BMI:  Body mass index is 24.9 kg/(m^2). Pt meets criteria for normal weight based on current BMI.  Estimated Nutritional Needs: Kcal: 25-30 kcal/kg Protein: > 1 gram protein/kg Fluid: 1 ml/kcal  Diet Order: General Pt is also offered choice of unit snacks mid-morning and mid-afternoon.  Pt is eating as desired.   Lab results and medications reviewed.   Antonieta Iba, RD, LDN ,

## 2013-09-21 NOTE — Progress Notes (Signed)
D) Pt has been withdrawn for much of the day. Pt was given a prn in the morning of 2 mg of Ativan at 0839 for increased agitation. Pt was able to rest after that and went to eat lunch this afternoon. Pt stated at 1539 "I am hallunicating my a-- off. I need something to help me". Pt reates his depression and hopelessness both at a 6. Admits to thoughts of SI. A) Pt provided with reassurance and praise. Encouragement given. Pt given Ativan 2 mg two times during this shift. One to help decrease the "hallunications". Praised for coming to the hospital. Support given.  R) Contracts for his safety on the unit.

## 2013-09-21 NOTE — BHH Group Notes (Signed)
[facility] LCSW Aftercare Discharge Planning Group Note   09/21/2013 8:45 AM  Participation Quality:  Alert, Appropriate and Oriented  Mood/Affect:  Flat and Depressed  Depression Rating:  "14"  Anxiety Rating:  "14"  Thoughts of Suicide:  Pt denies SI/HI  Will you contract for safety?   Yes  Current AVH:  Pt denies  Plan for Discharge/Comments:  Pt attended discharge planning group and actively participated in group.  CSW provided pt with today's workbook.  Pt reports having poor sleep for the last few days.  Pt wants further inpatient treatment.  Pt referred to [facility] awaiting bed date.  Pt also can follow up at [facility] for outpatient medication management and therapy.  Pt discussed waiting for his insurance to kick in and then possibly wanting a referral to another treatment center.  No further needs voiced by pt at this time.    Transportation Means: Pt reports access to transportation  Supports: No supports mentioned at this time  Regan Lemming, LCSW 09/21/2013 9:58 AM

## 2013-09-21 NOTE — BHH Suicide Risk Assessment (Signed)
[facility] Adult Inpatient Family/Significant Other Suicide Prevention Education  Suicide Prevention Education:   Patient Refusal for Family/Significant Other Suicide Prevention Education: The patient has refused to provide written consent for family/significant other to be provided Family/Significant Other Suicide Prevention Education during admission and/or prior to discharge.  Physician notified.  CSW provided suicide prevention information with patient.    The suicide prevention education provided includes the following:  Suicide risk factors  Suicide prevention and interventions  National Suicide Hotline telephone number  [facility] assessment telephone number  [facility] Emergency Assistance [place] and/or Residential Mobile Crisis Unit telephone number   Regan Lemming, [place] 09/21/2013 9:16 AM

## 2013-09-22 ENCOUNTER — Encounter (HOSPITAL_COMMUNITY): Payer: Self-pay | Admitting: General Practice

## 2013-09-22 ENCOUNTER — Inpatient Hospital Stay (HOSPITAL_COMMUNITY)
Admission: AD | Admit: 2013-09-22 | Discharge: 2013-09-25 | DRG: 896 | Disposition: A | Discharge status: Home / Self Care | Payer: No Typology Code available for payment source | Source: Other Acute Inpatient Hospital | Attending: Internal Medicine | Admitting: Internal Medicine

## 2013-09-22 DIAGNOSIS — F4310 Post-traumatic stress disorder, unspecified: Secondary | ICD-10-CM

## 2013-09-22 DIAGNOSIS — F1420 Cocaine dependence, uncomplicated: Secondary | ICD-10-CM

## 2013-09-22 DIAGNOSIS — F1020 Alcohol dependence, uncomplicated: Secondary | ICD-10-CM

## 2013-09-22 DIAGNOSIS — I2510 Atherosclerotic heart disease of native coronary artery without angina pectoris: Secondary | ICD-10-CM | POA: Diagnosis present

## 2013-09-22 DIAGNOSIS — Z853 Personal history of malignant neoplasm of breast: Secondary | ICD-10-CM

## 2013-09-22 DIAGNOSIS — R7401 Elevation of levels of liver transaminase levels: Secondary | ICD-10-CM | POA: Diagnosis present

## 2013-09-22 DIAGNOSIS — I10 Essential (primary) hypertension: Secondary | ICD-10-CM | POA: Diagnosis present

## 2013-09-22 DIAGNOSIS — F1410 Cocaine abuse, uncomplicated: Secondary | ICD-10-CM | POA: Diagnosis present

## 2013-09-22 DIAGNOSIS — I739 Peripheral vascular disease, unspecified: Secondary | ICD-10-CM | POA: Diagnosis present

## 2013-09-22 DIAGNOSIS — E7800 Pure hypercholesterolemia, unspecified: Secondary | ICD-10-CM | POA: Diagnosis present

## 2013-09-22 DIAGNOSIS — R569 Unspecified convulsions: Secondary | ICD-10-CM

## 2013-09-22 DIAGNOSIS — K7030 Alcoholic cirrhosis of liver without ascites: Secondary | ICD-10-CM | POA: Diagnosis present

## 2013-09-22 DIAGNOSIS — F339 Major depressive disorder, recurrent, unspecified: Secondary | ICD-10-CM

## 2013-09-22 DIAGNOSIS — R740 Nonspecific elevation of levels of transaminase and lactic acid dehydrogenase [LDH]: Secondary | ICD-10-CM

## 2013-09-22 DIAGNOSIS — Z79899 Other long term (current) drug therapy: Secondary | ICD-10-CM

## 2013-09-22 DIAGNOSIS — F10939 Alcohol use, unspecified with withdrawal, unspecified: Secondary | ICD-10-CM

## 2013-09-22 DIAGNOSIS — Z85528 Personal history of other malignant neoplasm of kidney: Secondary | ICD-10-CM

## 2013-09-22 DIAGNOSIS — F209 Schizophrenia, unspecified: Secondary | ICD-10-CM | POA: Diagnosis present

## 2013-09-22 DIAGNOSIS — F10239 Alcohol dependence with withdrawal, unspecified: Secondary | ICD-10-CM

## 2013-09-22 DIAGNOSIS — F10229 Alcohol dependence with intoxication, unspecified: Principal | ICD-10-CM | POA: Diagnosis present

## 2013-09-22 DIAGNOSIS — N2889 Other specified disorders of kidney and ureter: Secondary | ICD-10-CM

## 2013-09-22 DIAGNOSIS — F10929 Alcohol use, unspecified with intoxication, unspecified: Secondary | ICD-10-CM

## 2013-09-22 DIAGNOSIS — F17200 Nicotine dependence, unspecified, uncomplicated: Secondary | ICD-10-CM | POA: Diagnosis present

## 2013-09-22 DIAGNOSIS — F319 Bipolar disorder, unspecified: Secondary | ICD-10-CM | POA: Diagnosis present

## 2013-09-22 DIAGNOSIS — G9340 Encephalopathy, unspecified: Secondary | ICD-10-CM | POA: Diagnosis present

## 2013-09-22 LAB — COMPREHENSIVE METABOLIC PANEL
ALT: 201 U/L — AB (ref 0–53)
AST: 191 U/L — ABNORMAL HIGH (ref 0–37)
Albumin: 3.5 g/dL (ref 3.5–5.2)
Alkaline Phosphatase: 53 U/L (ref 39–117)
BUN: 10 mg/dL (ref 6–23)
CALCIUM: 9.6 mg/dL (ref 8.4–10.5)
CO2: 29 meq/L (ref 19–32)
CREATININE: 0.92 mg/dL (ref 0.50–1.35)
Chloride: 95 mEq/L — ABNORMAL LOW (ref 96–112)
GLUCOSE: 98 mg/dL (ref 70–99)
Potassium: 3.5 mEq/L — ABNORMAL LOW (ref 3.7–5.3)
Sodium: 135 mEq/L — ABNORMAL LOW (ref 137–147)
TOTAL PROTEIN: 7.2 g/dL (ref 6.0–8.3)
Total Bilirubin: 0.4 mg/dL (ref 0.3–1.2)

## 2013-09-22 LAB — MAGNESIUM: MAGNESIUM: 1.6 mg/dL (ref 1.5–2.5)

## 2013-09-22 LAB — URINALYSIS, ROUTINE W REFLEX MICROSCOPIC
BILIRUBIN URINE: NEGATIVE
Glucose, UA: NEGATIVE mg/dL
Hgb urine dipstick: NEGATIVE
KETONES UR: NEGATIVE mg/dL
Leukocytes, UA: NEGATIVE
Nitrite: NEGATIVE
PH: 7.5 (ref 5.0–8.0)
Protein, ur: NEGATIVE mg/dL
Specific Gravity, Urine: 1.014 (ref 1.005–1.030)
Urobilinogen, UA: 1 mg/dL (ref 0.0–1.0)

## 2013-09-22 LAB — PHOSPHORUS: PHOSPHORUS: 3.9 mg/dL (ref 2.3–4.6)

## 2013-09-22 LAB — ETHANOL

## 2013-09-22 MED ORDER — ADULT MULTIVITAMIN W/MINERALS CH
1.0000 | ORAL_TABLET | Freq: Every day | ORAL | Status: DC
  Administered 2013-09-22 – 2013-09-25 (×4): 1 via ORAL
  Filled 2013-09-22 (×4): qty 1

## 2013-09-22 MED ORDER — VITAMIN B-1 100 MG PO TABS
100.0000 mg | ORAL_TABLET | Freq: Every day | ORAL | Status: DC
  Administered 2013-09-22 – 2013-09-25 (×4): 100 mg via ORAL
  Filled 2013-09-22 (×4): qty 1

## 2013-09-22 MED ORDER — METOPROLOL TARTRATE 25 MG PO TABS
25.0000 mg | ORAL_TABLET | Freq: Two times a day (BID) | ORAL | Status: DC
  Administered 2013-09-22 – 2013-09-25 (×6): 25 mg via ORAL
  Filled 2013-09-22 (×7): qty 1

## 2013-09-22 MED ORDER — DIPHENHYDRAMINE HCL 25 MG PO TABS
25.0000 mg | ORAL_TABLET | Freq: Four times a day (QID) | ORAL | Status: DC | PRN
  Filled 2013-09-22: qty 1

## 2013-09-22 MED ORDER — ARIPIPRAZOLE 2 MG PO TABS
4.0000 mg | ORAL_TABLET | Freq: Every day | ORAL | Status: DC
  Administered 2013-09-22 – 2013-09-24 (×3): 4 mg via ORAL
  Filled 2013-09-22 (×4): qty 2

## 2013-09-22 MED ORDER — ONDANSETRON HCL 4 MG PO TABS: 4.0000 mg | ORAL_TABLET | Freq: Four times a day (QID) | ORAL | Status: DC | PRN

## 2013-09-22 MED ORDER — METOPROLOL TARTRATE 1 MG/ML IV SOLN: 5.0000 mg | INTRAVENOUS | Status: DC | PRN

## 2013-09-22 MED ORDER — SODIUM CHLORIDE 0.9 % IJ SOLN
3.0000 mL | Freq: Two times a day (BID) | INTRAMUSCULAR | Status: DC
  Administered 2013-09-22 – 2013-09-24 (×3): 3 mL via INTRAVENOUS

## 2013-09-22 MED ORDER — ATORVASTATIN CALCIUM 20 MG PO TABS
20.0000 mg | ORAL_TABLET | Freq: Every day | ORAL | Status: DC
  Administered 2013-09-22 – 2013-09-24 (×3): 20 mg via ORAL
  Filled 2013-09-22 (×4): qty 1

## 2013-09-22 MED ORDER — ONDANSETRON HCL 4 MG/2ML IJ SOLN
4.0000 mg | Freq: Four times a day (QID) | INTRAMUSCULAR | Status: DC | PRN
  Administered 2013-09-24: 4 mg via INTRAVENOUS
  Filled 2013-09-22: qty 2

## 2013-09-22 MED ORDER — ENOXAPARIN SODIUM 40 MG/0.4ML ~~LOC~~ SOLN
40.0000 mg | SUBCUTANEOUS | Status: DC
  Administered 2013-09-22 – 2013-09-24 (×3): 40 mg via SUBCUTANEOUS
  Filled 2013-09-22 (×4): qty 0.4

## 2013-09-22 MED ORDER — IBUPROFEN 400 MG PO TABS
400.0000 mg | ORAL_TABLET | Freq: Once | ORAL | Status: AC
  Administered 2013-09-22: 400 mg via ORAL
  Filled 2013-09-22: qty 1

## 2013-09-22 MED ORDER — SODIUM CHLORIDE 0.9 % IV SOLN
INTRAVENOUS | Status: DC
  Administered 2013-09-22 – 2013-09-25 (×5): via INTRAVENOUS

## 2013-09-22 MED ORDER — RAMELTEON 8 MG PO TABS
8.0000 mg | ORAL_TABLET | Freq: Every day | ORAL | Status: DC
  Administered 2013-09-22 – 2013-09-23 (×2): 8 mg via ORAL
  Filled 2013-09-22 (×4): qty 1

## 2013-09-22 MED ORDER — INFLUENZA VAC SPLIT QUAD 0.5 ML IM SUSP
0.5000 mL | INTRAMUSCULAR | Status: AC
  Administered 2013-09-23: 0.5 mL via INTRAMUSCULAR
  Filled 2013-09-22: qty 0.5

## 2013-09-22 MED ORDER — DULOXETINE HCL 30 MG PO CPEP
30.0000 mg | ORAL_CAPSULE | Freq: Every day | ORAL | Status: DC
  Administered 2013-09-22 – 2013-09-25 (×4): 30 mg via ORAL
  Filled 2013-09-22 (×4): qty 1

## 2013-09-22 MED ORDER — AMLODIPINE BESYLATE 5 MG PO TABS
5.0000 mg | ORAL_TABLET | Freq: Every day | ORAL | Status: DC
  Administered 2013-09-22 – 2013-09-25 (×4): 5 mg via ORAL
  Filled 2013-09-22 (×4): qty 1

## 2013-09-22 MED ORDER — SIMVASTATIN 40 MG PO TABS
40.0000 mg | ORAL_TABLET | Freq: Every evening | ORAL | Status: DC
  Filled 2013-09-22: qty 1

## 2013-09-22 MED ORDER — FOLIC ACID 1 MG PO TABS
1.0000 mg | ORAL_TABLET | Freq: Every day | ORAL | Status: DC
  Administered 2013-09-22 – 2013-09-25 (×4): 1 mg via ORAL
  Filled 2013-09-22 (×4): qty 1

## 2013-09-22 NOTE — Discharge Summary (Signed)
Physician Discharge Summary Note  Patient:  [name] is an 33 y.o., male MRN:  [id number] DOB:  1981-01-27 Patient phone:  [phone] (home)  Patient address:   [place] [address],  Total Time spent with patient: 45 minutes  Date of Admission:  09/20/2013 Date of Discharge: 09/22/2013  Reason for Admission:  [name] with multiple medical conditions in one of multiple admission to [facility]. He was last here less than 2 months ago. He was supposed to follow up with [facility]. He has not have his medications and has been drinking and using Cocaine every day. He states he had been thinking about suicide for the last week. It got to the point he was planning to do it. He asked for help.    Discharge Diagnoses: Active Problems:   Alcohol intoxication   Psychiatric Specialty Exam: Physical Exam  Constitutional: He appears distressed.  HENT:  Head: Normocephalic.  Eyes: Pupils are equal, round, and reactive to light.  Neck: Normal range of motion.  Respiratory: Effort normal.  GI: Soft.  Neurological: Coordination normal.  Disoriented x 2 time and place.  Skin:  Warm and flushed skin  Psychiatric:  Poor historian, patient is pyschosis    Review of Systems  Constitutional: Positive for diaphoresis.  Genitourinary: Positive for hematuria and flank pain.  Neurological: Positive for tremors.  Psychiatric/Behavioral: Positive for depression, hallucinations, memory loss and substance abuse. The patient is nervous/anxious and has insomnia.     Blood pressure 125/84, pulse 82, temperature 98.1 F (36.7 C), temperature source Oral, resp. rate 18, height '5\' 7"'  (1.702 m), weight 72.576 kg (160 lb), SpO2 98.00%.Body mass index is 25.05 kg/(m^2).  General Appearance: Bizarre  Eye Contact::  Minimal  Speech:  Garbled, Slow and Slurred  Volume:  Decreased  Mood:  Depressed  Affect:  Depressed, Flat and Inappropriate  Thought Process:  Disorganized,  Loose and Tangential  Orientation:  Other:  person, disoriented to time and place  Thought Content:  Delusions, Hallucinations: Auditory Visual and Rumination  Suicidal Thoughts:  No  Homicidal Thoughts:  No  Memory:  Immediate;   Poor Recent;   Fair Remote;   Poor  Judgement:  Impaired  Insight:  Lacking  Psychomotor Activity:  Restlessness  Concentration:  Poor  Recall:  [employer] of Knowledge:Poor  Language: Fair  Akathisia:  No  Handed:  Right  AIMS (if indicated):     Assets:  Resilience Social Support  Sleep:       Past Psychiatric History: Diagnosis:  Hospitalizations:  Outpatient Care:  Substance Abuse Care:  Self-Mutilation:  Suicidal Attempts:  Violent Behaviors:   Musculoskeletal: Strength & Muscle Tone: decreased Gait & Station: unsteady Patient leans: Right and N/A  DSM5:  Schizophrenia Disorders:  Brief Psychotic Disorder (298.8) and Schizophrenia (295.7) Obsessive-Compulsive Disorders:   Trauma-Stressor Disorders:  Posttraumatic Stress Disorder (309.81) Substance/Addictive Disorders:  Alcohol Withdrawal with Perceptural Disturbances (F10.232)) Depressive Disorders:  Major Depressive Disorder - with Psychotic Features (296.24)  Axis Diagnosis:   AXIS I:  Alcohol Abuse, Depressive Disorder secondary to general medical condition, Major Depression, Recurrent severe, Post Traumatic Stress Disorder, Psychotic Disorder NOS and Substance Abuse AXIS II:  Schizophrenia AXIS III:   Past Medical History  Diagnosis Date  . Seizures   . Hypertension   . Depression   . Pancreatitis   . Liver cirrhosis   . Coronary artery disease   . Angina   . Shortness of breath   .  Headache(784.0)   . Peripheral vascular disease April 2011    Left Pop  . Hypercholesteremia   . Schizophrenia   . Bipolar 1 disorder   . Cancer of kidney   . Breast CA    AXIS IV:  economic problems, educational problems, housing problems, occupational problems, other psychosocial  or environmental problems, problems related to social environment, problems with access to health care services and problems with primary support group AXIS V:  21-30 behavior considerably influenced by delusions or hallucinations OR serious impairment in judgment, communication OR inability to function in almost all areas  Level of Care:  [facility] Hospital Course:  Pt was rapidly deteriorating upon physical findings it was noted that patient was actively hallucinating and delusional. He was observed cleaning floors on his hands and knees. He was on ativan detox protocol, with no relief in symptoms. In collaboration with [place] [facility] Heckle), we agree that patient need medical consult, and possible admission for Iv hydration and telemetry due to DT.   Consults:  Internal medicine-[facility]  Significant Diagnostic Studies:  labs: Elevated LFT  Discharge Vitals:   Blood pressure 125/84, pulse 82, temperature 98.1 F (36.7 C), temperature source Oral, resp. rate 18, height '5\' 7"'  (1.702 m), weight 72.576 kg (160 lb), SpO2 98.00%. Body mass index is 25.05 kg/(m^2). Lab Results:   Results for orders placed during the hospital encounter of 09/22/13 (from the past 72 hour(s))  COMPREHENSIVE METABOLIC PANEL     Status: Abnormal   Collection Time    09/22/13  4:30 PM      Result Value Ref Range   Sodium 135 (*) 137 - 147 mEq/L   Potassium 3.5 (*) 3.7 - 5.3 mEq/L   Chloride 95 (*) 96 - 112 mEq/L   CO2 29  19 - 32 mEq/L   Glucose, Bld 98  70 - 99 mg/dL   BUN 10  6 - 23 mg/dL   Creatinine, Ser 0.92  0.50 - 1.35 mg/dL   Calcium 9.6  8.4 - 10.5 mg/dL   Total Protein 7.2  6.0 - 8.3 g/dL   Albumin 3.5  3.5 - 5.2 g/dL   AST 191 (*) 0 - 37 U/L   ALT 201 (*) 0 - 53 U/L   Alkaline Phosphatase 53  39 - 117 U/L   Total Bilirubin 0.4  0.3 - 1.2 mg/dL   GFR calc non Af Amer >90  >90 mL/min   GFR calc Af Amer >90  >90 mL/min   Comment: (NOTE)     The eGFR has been calculated using the CKD EPI equation.     This  calculation has not been validated in all clinical situations.     eGFR's persistently <90 mL/min signify possible Chronic Kidney     Disease.  MAGNESIUM     Status: None   Collection Time    09/22/13  4:30 PM      Result Value Ref Range   Magnesium 1.6  1.5 - 2.5 mg/dL  PHOSPHORUS     Status: None   Collection Time    09/22/13  4:30 PM      Result Value Ref Range   Phosphorus 3.9  2.3 - 4.6 mg/dL    Physical Findings: AIMS:  , ,  ,  ,    CIWA:  CIWA-Ar Total: 2 COWS:     Psychiatric Specialty Exam: See Psychiatric Specialty Exam and Suicide Risk Assessment completed by Attending Physician prior to discharge.  Discharge destination:  Other:  [place].   Is patient on multiple antipsychotic therapies at discharge:  Yes,   Do you recommend tapering to monotherapy for antipsychotics?  No   Has Patient had three or more failed trials of antipsychotic monotherapy by history:  No  Recommended Plan for Multiple Antipsychotic Therapies: NA   Future Appointments Provider Department Dept Phone   01/15/2014 3:00 PM [facility] [phone]   01/15/2014 3:30 PM [facility] [place] [phone]   01/15/2014 4:00 PM Rosetta Posner, MD [facility] [phone]       Medication List    ASK your doctor about these medications     Indication   amLODipine 5 MG tablet  Commonly known as:  NORVASC  Take 5 mg by mouth daily.      ARIPiprazole 2 MG tablet  Commonly known as:  ABILIFY  Take 4 mg by mouth at bedtime.      diphenhydrAMINE 25 MG tablet  Commonly known as:  BENADRYL  Take 25 mg by mouth every 6 (six) hours as needed for itching.      DULoxetine 30 MG capsule  Commonly known as:  CYMBALTA  Take 30 mg by mouth daily.      LORazepam 0.5 MG tablet  Commonly known as:  ATIVAN  Take 0.5 mg by mouth every 6 (six) hours as needed for anxiety.      metoprolol tartrate 25 MG  tablet  Commonly known as:  LOPRESSOR  Take 25 mg by mouth 2 (two) times daily.      ramelteon 8 MG tablet  Commonly known as:  ROZEREM  Take 8 mg by mouth at bedtime.      simvastatin 40 MG tablet  Commonly known as:  ZOCOR  Take 40 mg by mouth daily.          Follow-up recommendations:  Activity:  Up with assistance Diet:  Low sodium Tests:  CMET, ETOH Other:  Internal medicine consult  Comments:  Spoke with Dr. Randell Loop) who admitted patient to [facility] -1T14. Pt was transported via non-emergency EMS. Will set up pysch consult as needed. Consider adding Haldol, or another anti-psychotic medication.   Total Discharge Time:  Greater than 30 minutes.  Signed: Nanci Pina FNP-BC 09/22/2013, 5:45 PM I have examined the patient and agree with the findings. Medical consult was requested and Dr. Suella Broad has agreed to transfer patient to telemetry bed for stabilization.

## 2013-09-22 NOTE — Progress Notes (Signed)
D) Pt's contact, [name] was called to notify her that Pt was being transferred to [facility] to continue his treatment for Detox. A) Call placed R) Pt's contact thanked this Probation officer for the updated information and stated she would be by to pick up his belongings this evening

## 2013-09-22 NOTE — Progress Notes (Signed)
 .  Psychoeducational Group Note    Date: 09/22/2013 Time:  0930  Goal Setting Purpose of Group: To be able to set a goal that is measurable and that can be accomplished in one day Participation Level:  Active  Participation Quality: Did not attend  [name]

## 2013-09-22 NOTE — Progress Notes (Signed)
Pt admitted to the unit. Pt is asleep but alert when spoken to. Oriented to room, staff, and call bell. Educated to call for any assistance. Bed in lowest position, call bell within reach- will continue to monitor.

## 2013-09-22 NOTE — BHH Group Notes (Signed)
[facility] Group Notes: (Clinical Social Work)   09/22/2013      Type of Therapy:  Group Therapy   Participation Level:  Did Not Attend    Selmer Dominion, LCSW 09/22/2013, 12:22 PM

## 2013-09-22 NOTE — H&P (Signed)
[facility] History and Physical  [name] VOJ:[id number] DOB: 04-01-1981 DOA: 09/22/2013  Referring physician: ED physician PCP: Elbert Ewings, FNP   Chief Complaint: hallucinations, alcohol intoxication    HPI:  [name] with multiple medical conditions including depression, anxiety, alcohol abuse who was admitted to [facility] March 5th, 2015 with hallucinations and bizarre behaviors. [facility] called March 7th, due to concern of alcohol withdrawal and elevated LFT's. Please note that pt is very confused and hallucinating and therefore unable to provide history. No reported chest pain or shortness of breath, no reported abdominal concerns. Initially he has reported suicidal ideations but currently not answering this question. I examined pt and recommended transfer to [facility] for further evaluation. No beds available at [facility] and request to transfer to [facility] made.   Assessment and Plan: Active Problems: Acute encephalopathy - unclear etiology and possibly psychiatric, ? Alcohol intoxication - will admit to telemetry bed - will repeat alcohol level - place on CIWA protocol - check CMET, UDS, 12 lead EKG  - place on IVF and provide sitter at bedside Alcohol abuse - will need to provide consultation on cessation once pt able to participate - psych consultation  Transaminitis  - secondary to alcoholic hepatitis - repeat CMET in AM HTN - continue home medical regimen  Suicidal, psychosis - psych consult requested   Radiological Exams on Admission: No results found.   Code Status: Full Family Communication: Pt at bedside Disposition Plan: Admit for further evaluation     Review of Systems:  Unable to obtain due to AMS     Past Medical History  Diagnosis Date  . Seizures   . Hypertension   . Depression   . Pancreatitis   . Liver cirrhosis   . Coronary artery disease   . Angina   . Shortness of breath   . Headache(784.0)   . Peripheral vascular disease April 2011     Left Pop  . Hypercholesteremia   . Schizophrenia   . Bipolar 1 disorder   . Cancer of kidney   . Breast CA     Past Surgical History  Procedure Laterality Date  . Chest surgery    . Left leg surgery    . Mastectomy    . Left kidney removal      Social History:  reports that he has been smoking Cigarettes.  He has a .4 pack-year smoking history. He has never used smokeless tobacco. He reports that he drinks about 4.8 ounces of alcohol per week. He reports that he uses illicit drugs ("Crack" cocaine and Cocaine) about once per week.  Allergies  Allergen Reactions  . Codeine Hives, Itching and Swelling  . Depakote [Divalproex Sodium] Other (See Comments)    "Bug out and hallucinate"  . Morphine Itching  . Penicillins Swelling  . Hydrocodone-Acetaminophen Itching and Rash    Family History  Problem Relation Age of Onset  . Stroke Other     Prior to Admission medications   Medication Sig Start Date End Date Taking? Authorizing Provider  amLODipine (NORVASC) 5 MG tablet Take 1 tablet (5 mg total) by mouth daily. For hypertension management 03/30/13   Encarnacion Slates, NP  ARIPiprazole (ABILIFY) 2 MG tablet Take 2 tablets (4 mg total) by mouth at bedtime. 08/06/13   Benjamine Mola, FNP  diphenhydrAMINE (BENADRYL) 25 MG tablet Take 25 mg by mouth every 6 (six) hours as needed for itching.    Historical Provider, MD  DULoxetine (CYMBALTA) 30 MG  capsule Take 1 capsule (30 mg total) by mouth daily. 08/06/13   Benjamine Mola, FNP  LORazepam (ATIVAN) 0.5 MG tablet Take 0.5 mg by mouth every 6 (six) hours as needed for anxiety.     Historical Provider, MD  metoprolol tartrate (LOPRESSOR) 25 MG tablet Take 1 tablet (25 mg total) by mouth 2 (two) times daily. For high blood pressure control/management 03/30/13   Encarnacion Slates, NP  ramelteon (ROZEREM) 8 MG tablet Take 1 tablet (8 mg total) by mouth at bedtime. 08/06/13   Benjamine Mola, FNP  simvastatin (ZOCOR) 40 MG tablet Take 1 tablet (40 mg  total) by mouth every evening. For high cholesterol control 03/30/13   Encarnacion Slates, NP    Physical Exam: Filed Vitals:   09/22/13 1348  BP: 131/90  Pulse: 82  Temp: 98.1 F (36.7 C)  TempSrc: Oral  Resp: 18  Height: 5\' 7"  (1.702 m)  Weight: 72.576 kg (160 lb)  SpO2: 98%    Physical Exam  Constitutional: Appears to be confused, hallucinating, NAD  HENT: Normocephalic. External right and left ear normal. Dry MM Eyes: Conjunctivae and EOM are normal. PERRLA, no scleral icterus.  Neck: Normal ROM. Neck supple. No JVD. No tracheal deviation. No thyromegaly.  CVS: RRR, S1/S2 +, no murmurs, no gallops, no carotid bruit.  Pulmonary: Effort and breath sounds normal, no stridor, rhonchi, wheezes, rales.  Abdominal: Soft. BS +,  no distension, tenderness, rebound or guarding.  Musculoskeletal: Normal range of motion. No edema and no tenderness.  Lymphadenopathy: No lymphadenopathy noted, cervical, inguinal. Neuro: Alert. No cranial nerve deficit. Skin: Skin is warm and dry. No rash noted. Not diaphoretic. No erythema. No pallor.  Psychiatric: visual hallucinations  Labs on Admission:  Basic Metabolic Panel:  Recent Labs Lab 09/19/13 0900  NA 143  K 3.9  CL 106  CO2 20  GLUCOSE 85  BUN 6  CREATININE 0.82  CALCIUM 8.6   Liver Function Tests:  Recent Labs Lab 09/19/13 0900  AST 210*  ALT 204*  ALKPHOS 53  BILITOT <0.2*  PROT 6.8  ALBUMIN 3.2*    Recent Labs Lab 09/19/13 0900  LIPASE 26   CBC:  Recent Labs Lab 09/19/13 0900  WBC 4.6  NEUTROABS 1.1*  HGB 14.9  HCT 42.4  MCV 89.1  PLT 164   EKG: Normal sinus rhythm, no ST/T wave changes  Faye Ramsay, MD  [facility] Pager [phone]  If 7PM-7AM, please contact night-coverage [email or web address] Password [email or web address] 09/22/2013, 1:58 PM

## 2013-09-22 NOTE — Progress Notes (Signed)
D.  Pt pleasant on approach, denies complaints other than anxiety at this time.  Positive for evening AA group.  Interacting appropriately with peers on unit.  Denies SI/HI is having some visual hallucinations but recognizes that they are withdrawal related.  A.  Support and encouragement offered, medication given as ordered for withdrawal symptoms.  R.  Pt remains safe on unit, will continue to monitor.

## 2013-09-22 NOTE — Progress Notes (Signed)
    [name] is seen OOB UAL in his room..he putters around. When this nurse enters his room, he is seen on the other side of his bed, down on all fours, wiping the floor with his hand ( as if he had a rag and was scrubing the floor). He looked up at this nurse and he does not recognize this nurse ( that has cared for him several times in the past at this hospital) and says " I need to get this floor back like we used to do it....could you give me some clorox???".    A HE is disoriented to place, time and situation. HE tells this nurse he thinks it's February and he's ready to " go to the store".  HE denies pain, discomfort, is " not sure" if ate breakfast this morning or not and asks this nurse to help hime clean up the water that he says he saw " dripping from the ceiling" beside his bed on the floor ( there is no water there). He is not able to answer the self inventory questions because he cannot stay on track, meaning he is asked a quesiton and then he says "" are you ready to go..ibuprofen need to go to the store". His affect is  Flat  And thought processes are  Disorganized. He demonstrates thought blocking . He denies active suicidality.   R RN spoke with NP who speaks with attending MD, re transfer of pt to medical hospital for continuing care.Transfer plans in process.

## 2013-09-22 NOTE — Progress Notes (Signed)
[name] Progress Note  09/22/2013 10:12 AM [name]  MRN:  [id number] Subjective:  [name] continues to have a hard time. He is feeling very agitated, grouchy, and very tired. He is still having  A hard time with detox, reports symptoms of diarrhea, night sweats, and shakes.  He reports not resting well last night, he reports waking up at around 1 or 2 due to trains coming in, and then I want to put on some clothes, and do something. Reports that he has a hard time eating, and rarely ever eats but this is also due to dental pain. Upon discharge he plans to go to [facility] for detox program. Pt states he will be back tomorrow for follow up visit at 4, he has to leave and go look at some houses, he is about to move out. He also wants to find a new christian roommate whether black or white male, because all the other ones whether male or male have been jealous of him.  Diagnosis:   DSM5: Schizophrenia Disorders:  none Obsessive-Compulsive Disorders:  none Trauma-Stressor Disorders:  Posttraumatic Stress Disorder (309.81) Substance/Addictive Disorders:  Alcohol Related Disorder - Severe (303.90), Cocaine related disorder Depressive Disorders:  Major Depressive Disorder - Severe (296.23) Total Time spent with patient: 30 minutes  Axis I: Generalized Anxiety Disorder and Substance Induced Mood Disorder  ADL's:  Intact  Sleep: Poor  Appetite:  Poor  Suicidal Ideation:  Plan:  denies Intent:  denies Means:  denies Homicidal Ideation:  Plan:  denies Intent:  denies Means:  denies AEB (as evidenced by):  Psychiatric Specialty Exam: Physical Exam   Review of Systems  Constitutional: Positive for malaise/fatigue.  HENT: Negative.   Respiratory: Negative.   Cardiovascular: Negative.   Gastrointestinal: Negative.   Genitourinary: Negative.   Musculoskeletal: Positive for myalgias.  Skin: Negative.   Neurological: Positive for weakness.  Endo/Heme/Allergies: Negative.    Psychiatric/Behavioral: Positive for depression, hallucinations and substance abuse. The patient is nervous/anxious.     Blood pressure 137/94, pulse 120, temperature 98 F (36.7 C), temperature source Oral, resp. rate 20, height 5\' 7"  (1.702 m), weight 72.122 kg (159 lb).Body mass index is 24.9 kg/(m^2).  General Appearance: Fairly Groomed  Engineer, water::  Fair  Speech:  Garbled and Slow  Volume:  Decreased  Mood:  Anxious and Depressed  Affect:  Restricted  Thought Process:  Disorganized, Irrelevant and Loose  Orientation:  Full (Time, Place, and Person)  Thought Content:  sympotms, worries, concerns  Suicidal Thoughts:  No  Homicidal Thoughts:  No  Memory:  Immediate;   Fair Recent;   Fair Remote;   Fair  Judgement:  Poor  Insight:  Lacking and Present  Psychomotor Activity:  Restlessness  Concentration:  Fair  Recall:  [employer] [place]: Fair  Akathisia:  No  Handed:    AIMS (if indicated):     Assets:  Desire for Improvement  Sleep:  Number of Hours: 0   Musculoskeletal: Strength & Muscle Tone: within normal limits Gait & Station: normal Patient leans: N/A  Current Medications: Current Facility-Administered Medications  Medication Dose Route Frequency Provider Last Rate Last Dose  . alum & mag hydroxide-simeth (MAALOX/MYLANTA) 200-200-20 MG/5ML suspension 30 mL  30 mL Oral PRN Shuvon Rankin, NP      . alum & mag hydroxide-simeth (MAALOX/MYLANTA) 200-200-20 MG/5ML suspension 30 mL  30 mL Oral Q4H PRN Laverle Hobby, PA-C      . amLODipine (NORVASC) tablet 5 mg  5 mg Oral Daily Laverle Hobby, PA-C   5 mg at 09/21/13 2671  . ARIPiprazole (ABILIFY) tablet 5 mg  5 mg Oral QHS Nicholaus Bloom, [name]   5 mg at 09/21/13 2058  . busPIRone (BUSPAR) tablet 7.5 mg  7.5 mg Oral BID Nicholaus Bloom, [name]   7.5 mg at 09/21/13 1819  . diphenhydrAMINE (BENADRYL) capsule 25 mg  25 mg Oral Q6H PRN Laverle Hobby, PA-C   25 mg at 09/21/13 2057  . DULoxetine (CYMBALTA)  DR capsule 60 mg  60 mg Oral Daily Nicholaus Bloom, [name]      . feeding supplement (ENSURE COMPLETE) (ENSURE COMPLETE) liquid 237 mL  237 mL Oral BID BM Darrol Jump, RD   237 mL at 09/21/13 1820  . hydrOXYzine (ATARAX/VISTARIL) tablet 25 mg  25 mg Oral Q6H PRN Laverle Hobby, PA-C   25 mg at 09/22/13 0155  . ibuprofen (ADVIL,MOTRIN) tablet 600 mg  600 mg Oral Q6H PRN Lurena Nida, NP   600 mg at 09/22/13 0155  . influenza vac split quadrivalent PF (FLUARIX) injection 0.5 mL  0.5 mL Intramuscular Tomorrow-1000 Nicholaus Bloom, [name]      . loperamide (IMODIUM) capsule 2-4 mg  2-4 mg Oral PRN Laverle Hobby, PA-C   4 mg at 09/20/13 0139  . LORazepam (ATIVAN) tablet 2 mg  2 mg Oral Q6H PRN Laverle Hobby, PA-C   2 mg at 09/22/13 2458  . magnesium hydroxide (MILK OF MAGNESIA) suspension 30 mL  30 mL Oral Daily PRN Laverle Hobby, PA-C      . metoprolol tartrate (LOPRESSOR) tablet 25 mg  25 mg Oral BID Laverle Hobby, PA-C   25 mg at 09/21/13 1819  . mirtazapine (REMERON) tablet 15 mg  15 mg Oral QHS Nicholaus Bloom, [name]   15 mg at 09/21/13 2057  . multivitamin with minerals tablet 1 tablet  1 tablet Oral Daily Laverle Hobby, PA-C   1 tablet at 09/21/13 [phone]  . ondansetron (ZOFRAN) tablet 4 mg  4 mg Oral Q8H PRN Shuvon Rankin, NP      . ondansetron (ZOFRAN-ODT) disintegrating tablet 4 mg  4 mg Oral Q6H PRN Laverle Hobby, PA-C   4 mg at 09/20/13 1730  . simvastatin (ZOCOR) tablet 40 mg  40 mg Oral QPM Laverle Hobby, PA-C   40 mg at 09/21/13 1823  . thiamine (B-1) injection 100 mg  100 mg Intramuscular Once [employer], PA-C      . thiamine (VITAMIN B-1) tablet 100 mg  100 mg Oral Daily Laverle Hobby, PA-C   100 mg at 09/21/13 3382    Lab Results:  No results found for this or any previous visit (from the past 48 hour(s)).  Physical Findings: AIMS: Facial and Oral Movements Muscles of Facial Expression: None, normal Lips and Perioral Area: None, normal Jaw: None, normal Tongue: None,  normal,Extremity Movements Upper (arms, wrists, hands, fingers): None, normal Lower (legs, knees, ankles, toes): None, normal, Trunk Movements Neck, shoulders, hips: None, normal, Overall Severity Severity of abnormal movements (highest score from questions above): None, normal Incapacitation due to abnormal movements: None, normal [name] awareness of abnormal movements (rate only patient's report): No Awareness, Dental Status Current problems with teeth and/or dentures?: No Does patient usually wear dentures?: No  CIWA:  CIWA-Ar Total: 15 COWS:     Treatment Plan Summary: Daily contact with patient to assess and evaluate symptoms and  progress in treatment Medication management  Plan: Supportive approach/coping skills/relapse prevention           Conitnue detox           Increase the Cymbalta to 60 mg                                   Abilify to 5 mg           D/C the Trazodone           Trial with Remeron 15 mg HS  Medical Decision Making Problem Points:  Established problem, worsening (2) and Review of psycho-social stressors (1) Data Points:  Review of medication regiment & side effects (2) Review of new medications or change in dosage (2)  I certify that inpatient services furnished can reasonably be expected to improve the patient's condition.   Nanci Pina FNP -[facility] 09/22/2013, 10:12 AM

## 2013-09-22 NOTE — Progress Notes (Signed)
Patient states that he may have brought his phone over to [place] from [facility]. RN did not see any devices with patient when he first arrived. Patient said that honestly he can not remember. Will continue to monitor

## 2013-09-23 DIAGNOSIS — F1010 Alcohol abuse, uncomplicated: Secondary | ICD-10-CM

## 2013-09-23 DIAGNOSIS — N289 Disorder of kidney and ureter, unspecified: Secondary | ICD-10-CM

## 2013-09-23 DIAGNOSIS — R569 Unspecified convulsions: Secondary | ICD-10-CM

## 2013-09-23 DIAGNOSIS — F339 Major depressive disorder, recurrent, unspecified: Secondary | ICD-10-CM

## 2013-09-23 LAB — COMPREHENSIVE METABOLIC PANEL
ALBUMIN: 3.1 g/dL — AB (ref 3.5–5.2)
ALT: 183 U/L — ABNORMAL HIGH (ref 0–53)
AST: 183 U/L — ABNORMAL HIGH (ref 0–37)
Alkaline Phosphatase: 50 U/L (ref 39–117)
BUN: 10 mg/dL (ref 6–23)
CO2: 28 mEq/L (ref 19–32)
Calcium: 9 mg/dL (ref 8.4–10.5)
Chloride: 97 mEq/L (ref 96–112)
Creatinine, Ser: 0.98 mg/dL (ref 0.50–1.35)
GFR calc Af Amer: >90 mL/min (ref >90)
GFR calc non Af Amer: >90 mL/min (ref >90)
Glucose, Bld: 103 mg/dL — ABNORMAL HIGH (ref 70–99)
POTASSIUM: 4.2 meq/L (ref 3.7–5.3)
Sodium: 136 mEq/L — ABNORMAL LOW (ref 137–147)
Total Bilirubin: 0.5 mg/dL (ref 0.3–1.2)
Total Protein: 6.6 g/dL (ref 6.0–8.3)

## 2013-09-23 LAB — CBC
HCT: 43.6 % (ref 39.0–52.0)
HEMOGLOBIN: 15.6 g/dL (ref 13.0–17.0)
MCH: 32 pg (ref 26.0–34.0)
MCHC: 35.8 g/dL (ref 30.0–36.0)
MCV: 89.5 fL (ref 78.0–100.0)
Platelets: 154 10*3/uL (ref 150–400)
RBC: 4.87 MIL/uL (ref 4.22–5.81)
RDW: 14.1 % (ref 11.5–15.5)
WBC: 4.5 10*3/uL (ref 4.0–10.5)

## 2013-09-23 LAB — TSH: TSH: 0.687 u[IU]/mL (ref 0.350–4.500)

## 2013-09-23 MED ORDER — LORAZEPAM 1 MG PO TABS
1.0000 mg | ORAL_TABLET | Freq: Four times a day (QID) | ORAL | Status: DC | PRN
  Administered 2013-09-23 – 2013-09-25 (×5): 1 mg via ORAL
  Filled 2013-09-23 (×5): qty 1

## 2013-09-23 NOTE — Progress Notes (Signed)
Patient mentioned in the morning he is helpless but never mentioned his suicidal. Discussed with the RN staff, he did NOT mention any suicidal ideation in the screening questionnaire. No need for bedside sitter because of lack of  suicidal ideation. I tried to notify psych called [phone] twice but no answer.[name] Pager: [phone] 09/23/2013, 6:50 PM

## 2013-09-23 NOTE — Progress Notes (Signed)
[facility] PROGRESS NOTE   [name] NLG:[id number] DOB: 1981-01-30 DOA: 09/22/2013 PCP: Elbert Ewings, FNP  HPI/Subjective: Denies any complaints, awake, alert and oriented x3.  Assessment/Plan: Active Problems:   Alcohol intoxication   Acute encephalopathy  -This is likely secondary to alcohol intoxication, resolved. -Discontinue telemetry. -Alcohol level is undetectable now. - check CMET, UDS, 12 lead EKG  - place on IVF and provide sitter at bedside   Alcohol abuse  - will need to provide consultation on cessation once pt able to participate  - psych consultation   Transaminitis  -Likely secondary to alcohol, check hepatitis panel. -Repeat CMET in AM   HTN  - continue home medical regimen   Suicidal, psychosis  - psych consult requested    Code Status: Full code Family Communication: Plan discussed with the patient. Disposition Plan: Remains inpatient   Consultants:  None  Procedures:  None  Antibiotics:  None   Objective: Filed Vitals:   09/23/13 0949  BP: 119/87  Pulse: 88  Temp:   Resp:     Intake/Output Summary (Last 24 hours) at 09/23/13 1209 Last data filed at 09/23/13 0950  Gross per 24 hour  Intake   1598 ml  Output    900 ml  Net    698 ml   Filed Weights   09/22/13 1348  Weight: 72.576 kg (160 lb)    Exam: General: Alert and awake, oriented x3, not in any acute distress. HEENT: anicteric sclera, pupils reactive to light and accommodation, EOMI CVS: S1-S2 clear, no murmur rubs or gallops Chest: clear to auscultation bilaterally, no wheezing, rales or rhonchi Abdomen: soft nontender, nondistended, normal bowel sounds, no organomegaly Extremities: no cyanosis, clubbing or edema noted bilaterally Neuro: Cranial nerves II-XII intact, no focal neurological deficits  Data Reviewed: Basic Metabolic Panel:  Recent Labs Lab 09/19/13 0900 09/22/13 1630 09/23/13 0700  NA 143 135* 136*  K 3.9 3.5* 4.2  CL 106  95* 97  CO2 20 29 28   GLUCOSE 85 98 103*  BUN 6 10 10   CREATININE 0.82 0.92 0.98  CALCIUM 8.6 9.6 9.0  MG  --  1.6  --   PHOS  --  3.9  --    Liver Function Tests:  Recent Labs Lab 09/19/13 0900 09/22/13 1630 09/23/13 0700  AST 210* 191* 183*  ALT 204* 201* 183*  ALKPHOS 53 53 50  BILITOT <0.2* 0.4 0.5  PROT 6.8 7.2 6.6  ALBUMIN 3.2* 3.5 3.1*    Recent Labs Lab 09/19/13 0900  LIPASE 26   No results found for this basename: AMMONIA,  in the last 168 hours CBC:  Recent Labs Lab 09/19/13 0900 09/23/13 0700  WBC 4.6 4.5  NEUTROABS 1.1*  --   HGB 14.9 15.6  HCT 42.4 43.6  MCV 89.1 89.5  PLT 164 154   Cardiac Enzymes: No results found for this basename: CKTOTAL, CKMB, CKMBINDEX, TROPONINI,  in the last 168 hours BNP (last 3 results) No results found for this basename: PROBNP,  in the last 8760 hours CBG: No results found for this basename: GLUCAP,  in the last 168 hours  Micro No results found for this or any previous visit (from the past 240 hour(s)).   Studies: No results found.  Scheduled Meds: . amLODipine  5 mg Oral Daily  . ARIPiprazole  4 mg Oral QHS  . atorvastatin  20 mg Oral q1800  . DULoxetine  30 mg Oral Daily  . enoxaparin (LOVENOX) injection  40 mg Subcutaneous  [id number]  . folic acid  1 mg Oral Daily  . metoprolol tartrate  25 mg Oral BID  . multivitamin with minerals  1 tablet Oral Daily  . ramelteon  8 mg Oral QHS  . sodium chloride  3 mL Intravenous Q12H  . thiamine  100 mg Oral Daily   Continuous Infusions: . sodium chloride 75 mL/hr at 09/23/13 0644       Time spent: 35 minutes    [facility] A  [facility] Pager [phone] If 7PM-7AM, please contact night-coverage at [email or web address], password [facility] 09/23/2013, 12:09 PM  LOS: 1 day

## 2013-09-24 DIAGNOSIS — R7401 Elevation of levels of liver transaminase levels: Secondary | ICD-10-CM | POA: Diagnosis present

## 2013-09-24 DIAGNOSIS — R740 Nonspecific elevation of levels of transaminase and lactic acid dehydrogenase [LDH]: Secondary | ICD-10-CM

## 2013-09-24 LAB — HEPATITIS PANEL, ACUTE
HCV Ab: NEGATIVE
HEP A IGM: NONREACTIVE
HEP B C IGM: NONREACTIVE
Hepatitis B Surface Ag: NEGATIVE

## 2013-09-24 MED ORDER — PANTOPRAZOLE SODIUM 20 MG PO TBEC
20.0000 mg | DELAYED_RELEASE_TABLET | Freq: Every day | ORAL | Status: DC
  Administered 2013-09-24 – 2013-09-25 (×2): 20 mg via ORAL
  Filled 2013-09-24 (×2): qty 1

## 2013-09-24 MED ORDER — FENTANYL CITRATE 0.05 MG/ML IJ SOLN
12.5000 ug | Freq: Once | INTRAMUSCULAR | Status: AC
  Administered 2013-09-24: 12.5 ug via INTRAVENOUS
  Filled 2013-09-24: qty 2

## 2013-09-24 MED ORDER — DIPHENHYDRAMINE HCL 25 MG PO CAPS
50.0000 mg | ORAL_CAPSULE | Freq: Every day | ORAL | Status: DC
  Administered 2013-09-24: 50 mg via ORAL
  Filled 2013-09-24 (×2): qty 2

## 2013-09-24 NOTE — Progress Notes (Signed)
On call physician return page and state no pain med at this time - physician state to give prn anxiety med and continue to assess pt.

## 2013-09-24 NOTE — Consult Note (Signed)
Reason for Consult:alcohol dependence and withdrawal, hallucinations and suicidal ideation Referring Physician: Dr. Meredith Pel [name] is an 33 y.o. male.  HPI: Patient was seen and chart reviewed. Patient was known to this provider from his previous hospitalizations and emergency room encounters. Patient reported he was diagnosed with a some kidney problems about 6 months ago since then has been relapsing on his alcohol dependence. Patient reported previously when he was a detox from the alcohol and also completed rehabs services was able to stay sober in the before he got relapsed. Patient has no current symptoms of alcohol withdrawal symptoms or depression  , anxiety, psychosis. Patient has no irritability, agitation and aggressive behaviors.   Patient was initially admitted to  [facility] March 5th, 2015 with hallucinations and bizarre behaviorsand then he was transferred after [facility] called March 7th, due to concern of alcohol withdrawal and elevated LFT's and very confused and hallucinating.   Mental Status Examination: Patient was calm, quiet and cooperative. Patient appeared as per his stated age and maintaining good eye contact. Patient has  finemood and his affect was  appropriate. He has normal rate, rhythm, and volume of speech. His thought process is linear and goal directed. Patient has denied suicidal, homicidal ideations, intentions or plans. Patient has no evidence of auditory or visual hallucinations, delusions, and paranoia. Patient has fair insight judgment and impulse control.  Past Medical History  Diagnosis Date  . Seizures   . Hypertension   . Depression   . Pancreatitis   . Liver cirrhosis   . Coronary artery disease   . Angina   . Shortness of breath   . Headache(784.0)   . Peripheral vascular disease April 2011    Left Pop  . Hypercholesteremia   . Schizophrenia   . Bipolar 1 disorder   . Cancer of kidney   . Breast CA     Past Surgical History  Procedure Laterality  Date  . Chest surgery    . Left leg surgery    . Mastectomy    . Left kidney removal      Family History  Problem Relation Age of Onset  . Stroke Other     Social History:  reports that he has been smoking Cigarettes.  He has a .4 pack-year smoking history. He has never used smokeless tobacco. He reports that he drinks about 4.8 ounces of alcohol per week. He reports that he uses illicit drugs ("Crack" cocaine and Cocaine) about once per week.  Allergies:  Allergies  Allergen Reactions  . Codeine Hives, Itching and Swelling  . Depakote [Divalproex Sodium] Other (See Comments)    "Bug out and hallucinate"  . Morphine Itching  . Penicillins Swelling  . Hydrocodone-Acetaminophen Itching and Rash    Medications: I have reviewed the patient's current medications.  Results for orders placed during the hospital encounter of 09/22/13 (from the past 48 hour(s))  ETHANOL     Status: None   Collection Time    09/22/13  4:00 PM      Result Value Ref Range   Alcohol, Ethyl (B) <11  0 - 11 mg/dL   Comment:            LOWEST DETECTABLE LIMIT FOR     SERUM ALCOHOL IS 11 mg/dL     FOR MEDICAL PURPOSES ONLY  COMPREHENSIVE METABOLIC PANEL     Status: Abnormal   Collection Time    09/22/13  4:30 PM      Result Value Ref  Range   Sodium 135 (*) 137 - 147 mEq/L   Potassium 3.5 (*) 3.7 - 5.3 mEq/L   Chloride 95 (*) 96 - 112 mEq/L   CO2 29  19 - 32 mEq/L   Glucose, Bld 98  70 - 99 mg/dL   BUN 10  6 - 23 mg/dL   Creatinine, Ser 0.92  0.50 - 1.35 mg/dL   Calcium 9.6  8.4 - 10.5 mg/dL   Total Protein 7.2  6.0 - 8.3 g/dL   Albumin 3.5  3.5 - 5.2 g/dL   AST 191 (*) 0 - 37 U/L   ALT 201 (*) 0 - 53 U/L   Alkaline Phosphatase 53  39 - 117 U/L   Total Bilirubin 0.4  0.3 - 1.2 mg/dL   GFR calc non Af Amer >90  >90 mL/min   GFR calc Af Amer >90  >90 mL/min   Comment: (NOTE)     The eGFR has been calculated using the CKD EPI equation.     This calculation has not been validated in all clinical  situations.     eGFR's persistently <90 mL/min signify possible Chronic Kidney     Disease.  MAGNESIUM     Status: None   Collection Time    09/22/13  4:30 PM      Result Value Ref Range   Magnesium 1.6  1.5 - 2.5 mg/dL  PHOSPHORUS     Status: None   Collection Time    09/22/13  4:30 PM      Result Value Ref Range   Phosphorus 3.9  2.3 - 4.6 mg/dL  TSH     Status: None   Collection Time    09/22/13  4:30 PM      Result Value Ref Range   TSH 0.687  0.350 - 4.500 uIU/mL   Comment: Performed at [employer] [facility], ROUTINE W REFLEX MICROSCOPIC     Status: Abnormal   Collection Time    09/22/13  9:43 PM      Result Value Ref Range   Color, Urine YELLOW  YELLOW   APPearance CLOUDY (*) CLEAR   Specific Gravity, Urine 1.014  1.005 - 1.030   pH 7.5  5.0 - 8.0   Glucose, UA NEGATIVE  NEGATIVE mg/dL   Hgb urine dipstick NEGATIVE  NEGATIVE   Bilirubin Urine NEGATIVE  NEGATIVE   Ketones, ur NEGATIVE  NEGATIVE mg/dL   Protein, ur NEGATIVE  NEGATIVE mg/dL   Urobilinogen, UA 1.0  0.0 - 1.0 mg/dL   Nitrite NEGATIVE  NEGATIVE   Leukocytes, UA NEGATIVE  NEGATIVE   Comment: MICROSCOPIC NOT DONE ON URINES WITH NEGATIVE PROTEIN, BLOOD, LEUKOCYTES, NITRITE, OR GLUCOSE <1000 mg/dL.  COMPREHENSIVE METABOLIC PANEL     Status: Abnormal   Collection Time    09/23/13  7:00 AM      Result Value Ref Range   Sodium 136 (*) 137 - 147 mEq/L   Potassium 4.2  3.7 - 5.3 mEq/L   Chloride 97  96 - 112 mEq/L   CO2 28  19 - 32 mEq/L   Glucose, Bld 103 (*) 70 - 99 mg/dL   BUN 10  6 - 23 mg/dL   Creatinine, Ser 0.98  0.50 - 1.35 mg/dL   Calcium 9.0  8.4 - 10.5 mg/dL   Total Protein 6.6  6.0 - 8.3 g/dL   Albumin 3.1 (*) 3.5 - 5.2 g/dL   AST 183 (*) 0 - 37 U/L   Comment:  HEMOLYSIS AT THIS LEVEL MAY AFFECT RESULT   ALT 183 (*) 0 - 53 U/L   Alkaline Phosphatase 50  39 - 117 U/L   Total Bilirubin 0.5  0.3 - 1.2 mg/dL   GFR calc non Af Amer >90  >90 mL/min   GFR calc Af Amer >90  >90 mL/min    Comment: (NOTE)     The eGFR has been calculated using the CKD EPI equation.     This calculation has not been validated in all clinical situations.     eGFR's persistently <90 mL/min signify possible Chronic Kidney     Disease.  CBC     Status: None   Collection Time    09/23/13  7:00 AM      Result Value Ref Range   WBC 4.5  4.0 - 10.5 K/uL   RBC 4.87  4.22 - 5.81 MIL/uL   Hemoglobin 15.6  13.0 - 17.0 g/dL   HCT 43.6  39.0 - 52.0 %   MCV 89.5  78.0 - 100.0 fL   MCH 32.0  26.0 - 34.0 pg   MCHC 35.8  30.0 - 36.0 g/dL   RDW 14.1  11.5 - 15.5 %   Platelets 154  150 - 400 K/uL  HEPATITIS PANEL, ACUTE     Status: None   Collection Time    09/23/13  7:18 PM      Result Value Ref Range   Hepatitis B Surface Ag NEGATIVE  NEGATIVE   HCV Ab NEGATIVE  NEGATIVE   Hep A IgM NON REACTIVE  NON REACTIVE   Hep B C IgM NON REACTIVE  NON REACTIVE   Comment: (NOTE)     High levels of Hepatitis B Core IgM antibody are detectable     during the acute stage of Hepatitis B. This antibody is used     to differentiate current from past HBV infection.     Performed at [facility]    No results found.  Positive for anxiety, bad mood, depression, excessive alcohol consumption and illegal drug usage Blood pressure 111/64, pulse 75, temperature 98.4 F (36.9 C), temperature source Oral, resp. rate 18, height '5\' 7"'  (1.702 m), weight 72.576 kg (160 lb), SpO2 95.00%.   Assessment/Plan: Alcohol dependence and status post alcohol withdrawal symptoms and alcoholic hallucinations  Recommendation: 1. Patient does not require psychiatric hospitalization for detox treatment any longer and recommend alcohol rehabilitation treatment he did not record services. 2. Referred to the psychiatric social services who can contact [facility] regarding referral needs 3. Appreciate psychiatric consultation and will sign off at this time   [name],[name] R. 09/24/2013, 3:36 PM

## 2013-09-24 NOTE — Progress Notes (Signed)
Utilization review completed.  

## 2013-09-24 NOTE — Clinical Social Work Psych Note (Signed)
Psych CSW now following.  Nonnie Done, [employer] [phone]  Clinical Social Work

## 2013-09-24 NOTE — Progress Notes (Signed)
PROGRESS NOTE  [name] FIE:[id number] DOB: July 23, 1980 DOA: 09/22/2013 PCP: Elbert Ewings, FNP  HPI/Subjective: 33 yr old male 3/7 for acute encephalopathy d/t alcohol intoxication with SI. pmh of alcohol and cocaine abuse, depression, anxiety, Htn, CAD, PVD, Bipolar, schizophrenia, breast cancer, kidney cancer. Complains of anxiety, lack of sleep and LLQ/ epigastric pain today.  Assessment/Plan:  Active Problems:   Alcohol dependence   Alcohol intoxication   Transaminitis   Acute encephalopathy with hallucinations/ confusion -This is likely secondary to alcohol intoxication, resolved with minor tremors.   -Discontinue telemetry.  -Alcohol level is undetectable now.  - IVF   - Cmet ordered for tomorrow - EKG shows normal sinus rhythm with no acute abnormalities   Alcohol abuse  - reports alc use is d/t anxiety- requests long term anxiety medication on d/c  - will need to provide consultation on cessation once pt able to participate  - awaiting psych consultation   Transaminitis  -Likely secondary to alcohol - hepatitis panel non reactive   - AST/ALT elevated at 183 on 3/8, down from 191 and 201 respectively on 3/4. -Repeat CMET on 3/10   HTN  - continue home medical regimen   Suicidal, psychosis  - psych consult requested, will be seen today - co current SI (3/9) - sitter no longer needed at bedside  - benadryl increased to 50 mg po at bedtime for insomnia    DVT Prophylaxis: Lovenox  Code Status: Full Family Communication: no family at bedside  Disposition Plan: awaiting behavioral inpatient admission  Consultants:  [facility] for placement.  Objective: Filed Vitals:   09/23/13 0949 09/23/13 1300 09/23/13 2133 09/24/13 0516  BP: 119/87 129/85 124/79 114/76  Pulse: 88 72 76 67  Temp:  98.2 F (36.8 C) 98.4 F (36.9 C) 97.9 F (36.6 C)  TempSrc:  Oral Oral Oral  Resp:  18 18 18   Height:      Weight:      SpO2:  100% 100% 98%     Intake/Output Summary (Last 24 hours) at 09/24/13 1014 Last data filed at 09/24/13 0631  Gross per 24 hour  Intake 1547.75 ml  Output   2000 ml  Net -452.25 ml   Filed Weights   09/22/13 1348  Weight: 72.576 kg (160 lb)    Exam: General: Well developed, well nourished, NAD, appears stated age  33:  PERR,  No pharyngeal erythema or exudates  Neck: Supple, no JVD, no masses  Cardiovascular: RRR, S1 S2 auscultated, no rubs, murmurs or gallops.   Respiratory: Clear to auscultation bilaterally with equal chest rise  Abdomen: Soft, LLQ and epigastric tenderness to palpation, nondistended, + bowel sounds  Extremities: warm dry without cyanosis clubbing or edema.  Neuro: AAOx3 Skin: Without rashes exudates or nodules.   Psych: mildly anxious, No current SI   Data Reviewed: Basic Metabolic Panel:  Recent Labs Lab 09/19/13 0900 09/22/13 1630 09/23/13 0700  NA 143 135* 136*  K 3.9 3.5* 4.2  CL 106 95* 97  CO2 20 29 28   GLUCOSE 85 98 103*  BUN 6 10 10   CREATININE 0.82 0.92 0.98  CALCIUM 8.6 9.6 9.0  MG  --  1.6  --   PHOS  --  3.9  --    Liver Function Tests:  Recent Labs Lab 09/19/13 0900 09/22/13 1630 09/23/13 0700  AST 210* 191* 183*  ALT 204* 201* 183*  ALKPHOS 53 53 50  BILITOT <0.2* 0.4 0.5  PROT 6.8 7.2 6.6  ALBUMIN  3.2* 3.5 3.1*    Recent Labs Lab 09/19/13 0900  LIPASE 26   No results found for this basename: AMMONIA,  in the last 168 hours CBC:  Recent Labs Lab 09/19/13 0900 09/23/13 0700  WBC 4.6 4.5  NEUTROABS 1.1*  --   HGB 14.9 15.6  HCT 42.4 43.6  MCV 89.1 89.5  PLT 164 154   Cardiac Enzymes: No results found for this basename: CKTOTAL, CKMB, CKMBINDEX, TROPONINI,  in the last 168 hours BNP (last 3 results) No results found for this basename: PROBNP,  in the last 8760 hours CBG: No results found for this basename: GLUCAP,  in the last 168 hours  No results found for this or any previous visit (from the past 240 hour(s)).    Studies: No results found.  Scheduled Meds: . amLODipine  5 mg Oral Daily  . ARIPiprazole  4 mg Oral QHS  . atorvastatin  20 mg Oral q1800  . DULoxetine  30 mg Oral Daily  . enoxaparin (LOVENOX) injection  40 mg Subcutaneous Q24H  . folic acid  1 mg Oral Daily  . metoprolol tartrate  25 mg Oral BID  . multivitamin with minerals  1 tablet Oral Daily  . ramelteon  8 mg Oral QHS  . sodium chloride  3 mL Intravenous Q12H  . thiamine  100 mg Oral Daily   Continuous Infusions: . sodium chloride 75 mL/hr at 09/24/13 0600    Active Problems:   Alcohol intoxication      [facility] Pager [phone]. If 7PM-7AM, please contact night-coverage at [email or web address], password [facility] 09/24/2013, 10:14 AM  LOS: 2 days     Addendum  Patient seen and examined, chart and data base reviewed.  I agree with the above assessment and plan.  For full details please see Mrs. Dominic Pea PA-C note.     Birdie Hopes, MD [facility] Pager: [phone] 09/24/2013, 4:16 PM

## 2013-09-25 NOTE — Care Management Note (Addendum)
    Page 1 of 1   09/25/2013     3:51:27 PM   CARE MANAGEMENT NOTE 09/25/2013  Patient:  [name], [name]   Account Number:  [id number]  Date Initiated:  09/25/2013  Documentation initiated by:  Tomi Bamberger  Subjective/Objective Assessment:   dx dt,etoh withdrawal  admit- lives with mom.     Action/Plan:   psych consult   Anticipated DC Date:  09/25/2013   Anticipated DC Plan:  HOME/SELF CARE  In-house referral  Clinical Social Worker      DC Planning Services  CM consult  [place] [facility]      Choice offered to / List presented to:             Status of service:  Completed, signed off Medicare Important Message given?   (If response is "NO", the following Medicare IM given date fields will be blank) Date Medicare IM given:   Date Additional Medicare IM given:    Discharge Disposition:  HOME/SELF CARE  Per UR Regulation:  Reviewed for med. necessity/level of care/duration of stay  If discussed at [place] of Stay Meetings, dates discussed:    Comments:  09/25/13 [place], BSN [phone] patient is for dc to home with mom today, per psych CSW , patient has an apt at [facility] tx program.  Patient stated he needed ast with medications, NCM assisted him with the Match program, gave him the Match letter.
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
## 2013-09-25 NOTE — Discharge Summary (Signed)
Physician Discharge Summary  [name]:[id number] DOB: [birth year] DOA: 09/22/2013  PCP: Elbert Ewings, FNP  Admit date: 09/22/2013 Discharge date: 09/25/2013  Time spent: 40 minutes  Recommendations for Outpatient Follow-up:  1. Follow up with [facility] recovery services on Thursday, 09/27/2013 at 8 AM.  Discharge Diagnoses:  Active Problems:   Alcohol dependence   Alcohol intoxication   Transaminitis   Discharge Condition: Stable  Diet recommendation: Regular diet  Filed Weights   09/22/13 1348  Weight: 72.576 kg (160 lb)    History of present illness:  [name] with multiple medical conditions including depression, anxiety, alcohol abuse who was admitted to [facility] March 5th, 2015 with hallucinations and bizarre behaviors. [facility] called March 7th, due to concern of alcohol withdrawal and elevated LFT's. Please note that pt is very confused and hallucinating and therefore unable to provide history. No reported chest pain or shortness of breath, no reported abdominal concerns. Initially he has reported suicidal ideations but currently not answering this question. I examined pt and recommended transfer to [facility] for further evaluation. No beds available at [facility] and request to transfer to [facility] made.    Hospital Course:   Acute encephalopathy with hallucinations/ confusion  - This is likely secondary to alcohol intoxication, resolved. - Discontinue telemetry.  - Alcohol level is undetectable now.  - Hydrated with IVF.  - EKG shows normal sinus rhythm with no acute abnormalities   Alcohol abuse  - reports alcohol use is d/t anxiety.  - Psych recommended to followup with outpatient recovery services. -Patient will followup with [facility] recovery services after tomorrow.   Transaminitis  -Likely secondary to alcohol  - hepatitis panel non reactive  - AST/ALT elevated at 183 on 3/8, down from 191 and 201 respectively on 3/4.   HTN  - continue home medical regimen    Suicidal, psychosis  - Psych consult requested, will be seen today. - No suicidal ideations, seen by psych okay to be discharged to outpatient recovery services   Procedures:  None  Consultations:  Psychiatry  Discharge Exam: Filed Vitals:   09/25/13 1027  BP: 109/71  Pulse: 74  Temp:   Resp:    General: Alert and awake, oriented x3, not in any acute distress. HEENT: anicteric sclera, pupils reactive to light and accommodation, EOMI CVS: S1-S2 clear, no murmur rubs or gallops Chest: clear to auscultation bilaterally, no wheezing, rales or rhonchi Abdomen: soft nontender, nondistended, normal bowel sounds, no organomegaly Extremities: no cyanosis, clubbing or edema noted bilaterally Neuro: Cranial nerves II-XII intact, no focal neurological deficits  Discharge Instructions  Discharge Orders   Future Appointments Provider Department Dept Phone   01/15/2014 3:00 PM [facility] [address] [phone]   01/15/2014 3:30 PM [facility] [address] [phone]   01/15/2014 4:00 PM Rosetta Posner, MD [facility] [phone]   Future Orders Complete By Expires   Increase activity slowly  As directed        Medication List         amLODipine 5 MG tablet  Commonly known as:  NORVASC  Take 5 mg by mouth daily.     ARIPiprazole 2 MG tablet  Commonly known as:  ABILIFY  Take 4 mg by mouth at bedtime.     diphenhydrAMINE 25 MG tablet  Commonly known as:  BENADRYL  Take 25 mg by mouth every 6 (six) hours as needed for itching.  DULoxetine 30 MG capsule  Commonly known as:  CYMBALTA  Take 30 mg by mouth daily.     LORazepam 0.5 MG tablet  Commonly known as:  ATIVAN  Take 0.5 mg by mouth every 6 (six) hours as needed for anxiety.     metoprolol tartrate 25 MG tablet  Commonly known as:  LOPRESSOR  Take 25 mg by mouth 2 (two) times daily.     ramelteon 8 MG tablet  Commonly  known as:  ROZEREM  Take 8 mg by mouth at bedtime.     simvastatin 40 MG tablet  Commonly known as:  ZOCOR  Take 40 mg by mouth daily.       Allergies  Allergen Reactions  . Codeine Hives, Itching and Swelling  . Depakote [Divalproex Sodium] Other (See Comments)    "Bug out and hallucinate"  . Morphine Itching  . Penicillins Swelling  . Hydrocodone-Acetaminophen Itching and Rash       Follow-up Information   Follow up with STEELE,ANTHONY, FNP In 1 week.   Specialty:  Nurse Practitioner   Contact information:   [address] [place] [address] [phone]       Follow up with [facility] [facility] On 09/27/2013. (@ 8:00 AM)        The results of significant diagnostics from this hospitalization (including imaging, microbiology, ancillary and laboratory) are listed below for reference.    Significant Diagnostic Studies: No results found.  Microbiology: No results found for this or any previous visit (from the past 240 hour(s)).   Labs: Basic Metabolic Panel:  Recent Labs Lab 09/19/13 0900 09/22/13 1630 09/23/13 0700  NA 143 135* 136*  K 3.9 3.5* 4.2  CL 106 95* 97  CO2 20 29 28   GLUCOSE 85 98 103*  BUN 6 10 10   CREATININE 0.82 0.92 0.98  CALCIUM 8.6 9.6 9.0  MG  --  1.6  --   PHOS  --  3.9  --    Liver Function Tests:  Recent Labs Lab 09/19/13 0900 09/22/13 1630 09/23/13 0700  AST 210* 191* 183*  ALT 204* 201* 183*  ALKPHOS 53 53 50  BILITOT <0.2* 0.4 0.5  PROT 6.8 7.2 6.6  ALBUMIN 3.2* 3.5 3.1*    Recent Labs Lab 09/19/13 0900  LIPASE 26   No results found for this basename: AMMONIA,  in the last 168 hours CBC:  Recent Labs Lab 09/19/13 0900 09/23/13 0700  WBC 4.6 4.5  NEUTROABS 1.1*  --   HGB 14.9 15.6  HCT 42.4 43.6  MCV 89.1 89.5  PLT 164 154   Cardiac Enzymes: No results found for this basename: CKTOTAL, CKMB, CKMBINDEX, TROPONINI,  in the last 168 hours BNP: BNP (last 3 results) No results found for  this basename: PROBNP,  in the last 8760 hours CBG: No results found for this basename: GLUCAP,  in the last 168 hours     Signed:  Minyon Billiter A  [facility] 09/25/2013, 1:16 PM

## 2013-09-25 NOTE — Progress Notes (Signed)
Nsg Discharge Note  Admit Date:  09/22/2013 Discharge date: 09/25/2013   [name] to be D/C'd Home per MD order.  AVS completed.  Copy for chart, and copy for patient signed, and dated. Patient/caregiver able to verbalize understanding.  Discharge Medication:   Medication List         amLODipine 5 MG tablet  Commonly known as:  NORVASC  Take 5 mg by mouth daily.     ARIPiprazole 2 MG tablet  Commonly known as:  ABILIFY  Take 4 mg by mouth at bedtime.     diphenhydrAMINE 25 MG tablet  Commonly known as:  BENADRYL  Take 25 mg by mouth every 6 (six) hours as needed for itching.     DULoxetine 30 MG capsule  Commonly known as:  CYMBALTA  Take 30 mg by mouth daily.     LORazepam 0.5 MG tablet  Commonly known as:  ATIVAN  Take 0.5 mg by mouth every 6 (six) hours as needed for anxiety.     metoprolol tartrate 25 MG tablet  Commonly known as:  LOPRESSOR  Take 25 mg by mouth 2 (two) times daily.     ramelteon 8 MG tablet  Commonly known as:  ROZEREM  Take 8 mg by mouth at bedtime.     simvastatin 40 MG tablet  Commonly known as:  ZOCOR  Take 40 mg by mouth daily.        Discharge Assessment: Filed Vitals:   09/25/13 1027  BP: 109/71  Pulse: 74  Temp:   Resp:    Skin clean, dry and intact without evidence of skin break down, no evidence of skin tears noted. IV catheter discontinued intact. Site without signs and symptoms of complications - no redness or edema noted at insertion site, patient denies c/o pain - only slight tenderness at site.  Dressing with slight pressure applied.  D/c Instructions-Education: Discharge instructions given to patient/family with verbalized understanding. D/c education completed with patient/family including follow up instructions, medication list, d/c activities limitations if indicated, with other d/c instructions as indicated by MD - patient able to verbalize understanding, all questions fully answered. Patient instructed to return  to ED, call 911, or call MD for any changes in condition.  Patient escorted via [place], and D/C home via private auto.  Bartosz Luginbill, Elissa Hefty, RN 09/25/2013 4:20 PM

## 2013-09-25 NOTE — Clinical Social Work Psych Note (Addendum)
12:38pm- Psych CSW met with pt at bedside to review disposition and safe dc plan.  Pt will be dc home with mom and dad while he awaits his bed opening at [facility] on Thursday (09/27/2013).  Full assessment to follow.  MD and RNCM updated.  12:06pm- Psych CSW reviewed disposition with MD who states that pt is cleared to be dc home and follow up with [facility] as pt has planned.  Psych CSW will review disposition with pt and request pt contract for sobriety until 8am on Thursday morning so not to lose his treatment bed.  Unit CSW updated.  11:55am - Psych CSW contacted [facility] re: pt possible admission.  Pt has been on the waitlist and will have a bed available at [facility] on 09/27/2013 - Thursday.  [facility] prefers that pt be transferred directly from hospital to tx facility.  Psych CSW will review this disposition plan with MD.    Chinita Pester states that pt will need to be AT [facility] on Thursday.  NO exceptions. Otherwise he will lose his bed.  MD has been paged to provide update.  Nonnie Done, [place] [phone]  Clinical Social Work

## 2013-09-27 NOTE — Progress Notes (Signed)
Patient Discharge Instructions:  After Visit Summary (AVS):   Faxed to:  09/26/13 Discharge Summary Note:   Faxed to:  09/26/13 Psychiatric Admission Assessment Note:   Faxed to:  09/26/13 Faxed/Sent to the Next Level Care provider:  09/26/13 Faxed to [facility] @ [phone] Faxed to [facility] @ [place], 09/27/2013, 3:24 PM

## 2013-09-28 NOTE — ED Provider Notes (Signed)
Medical screening examination/treatment/procedure(s) were performed by non-physician practitioner and as supervising physician I was immediately available for consultation/collaboration.   EKG Interpretation None        Blanchie Dessert, MD 09/28/13 0000

## 2013-10-07 NOTE — Discharge Summary (Signed)
Author: Merian Capron, MD Service: Psychiatry Author Type: Physician   Filed: 09/23/2013 10:44 PM Note Time: 09/22/2013 5:45 PM Status: Signed   Editor: Merian Capron, MD (Physician)     Related Notes: Original Note by Nanci Pina, FNP ([place]) filed at 09/22/2013 6:02 PM    Physician Discharge Summary Note  Patient: [name] is an 33 y.o., male  MRN: [id number]  DOB: April 03, 1981  Patient phone: [phone] (home)  Patient address:  [place] [address],  Total Time spent with patient: 45 minutes  Date of Admission: 09/20/2013  Date of Discharge: 09/22/2013  Reason for Admission: [name] with multiple medical conditions in one of multiple admission to [facility]. He was last here less than 2 months ago. He was supposed to follow up with [facility]. He has not have his medications and has been drinking and using Cocaine every day. He states he had been thinking about suicide for the last week. It got to the point he was planning to do it. He asked for help.  Discharge Diagnoses:  Active Problems:  Alcohol intoxication  Psychiatric Specialty Exam:    Physical Exam  Constitutional: He appears distressed.  HENT:  Head: Normocephalic.  Eyes: Pupils are equal, round, and reactive to light.  Neck: Normal range of motion.  Respiratory: Effort normal.  GI: Soft.  Neurological: Coordination normal.  Disoriented x 2 time and place.  Skin:  Warm and flushed skin  Psychiatric:  Poor historian, patient is pyschosis    Review of Systems  Constitutional: Positive for diaphoresis.  Genitourinary: Positive for hematuria and flank pain.  Neurological: Positive for tremors.  Psychiatric/Behavioral: Positive for depression, hallucinations, memory loss and substance abuse. The patient is nervous/anxious and has insomnia.    Blood pressure 125/84, pulse 82, temperature 98.1 F (36.7 C), temperature source Oral, resp. rate 18, height '5\' 7"'  (1.702 m),  weight 72.576 kg (160 lb), SpO2 98.00%.Body mass index is 25.05 kg/(m^2).    General Appearance: Bizarre    Eye Contact:: Minimal    Speech: Garbled, Slow and Slurred    Volume: Decreased    Mood: Depressed    Affect: Depressed, Flat and Inappropriate    Thought Process: Disorganized, Loose and Tangential    Orientation: Other: person, disoriented to time and place    Thought Content: Delusions, Hallucinations: Auditory  Visual and Rumination    Suicidal Thoughts: No    Homicidal Thoughts: No    Memory: Immediate; Poor  Recent; Fair  Remote; Poor    Judgement: Impaired    Insight: Lacking    Psychomotor Activity: Restlessness    Concentration: Poor    Recall: [employer] of Knowledge:Poor    Language: Fair    Akathisia: No    Handed: Right    AIMS (if indicated):    Assets: Resilience  Social Support    Sleep:    Past Psychiatric History:    Diagnosis:    Hospitalizations:    Outpatient Care:    Substance Abuse Care:    Self-Mutilation:    Suicidal Attempts:    Violent Behaviors:    Musculoskeletal:  Strength & Muscle Tone: decreased  Gait & Station: unsteady  Patient leans: Right and N/A  DSM5:  Schizophrenia Disorders: Brief Psychotic Disorder (298.8) and Schizophrenia (295.7)  Obsessive-Compulsive Disorders:  Trauma-Stressor Disorders: Posttraumatic Stress Disorder (309.81)  Substance/Addictive Disorders: 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  Seizures      .  Hypertension      .  Depression      .  Pancreatitis      .  Liver cirrhosis      .  Coronary artery  disease      .  Angina      .  Shortness of breath      .  Headache(784.0)      .  Peripheral vascular disease  April 2011       Left Pop     .  Hypercholesteremia      .  Schizophrenia      .  Bipolar 1 disorder      .  Cancer of kidney      .  Breast CA      AXIS IV: economic problems, educational problems, housing problems, occupational problems, other psychosocial or environmental problems, problems related to social environment, problems with access to health care services and problems with primary support group  AXIS V: 21-30 behavior considerably influenced by delusions or hallucinations OR serious impairment in judgment, communication OR inability to function in almost all areas  Level of Care: [facility] Hospital Course: Pt was rapidly deteriorating upon physical findings it was noted that patient was actively hallucinating and delusional. He was observed cleaning floors on his hands and knees. He was on ativan detox protocol, with no relief in symptoms. In collaboration with [place] Olena Heckle), we agree that patient need medical consult, and possible admission for Iv hydration and telemetry due to DT.  Consults: Internal medicine-TRH  Significant Diagnostic Studies: labs: Elevated LFT  Discharge Vitals:  Blood pressure 125/84, pulse 82, temperature 98.1 F (36.7 C), temperature source Oral, resp. rate 18, height '5\' 7"'  (1.702 m), weight 72.576 kg (160 lb), SpO2 98.00%.  Body mass index is 25.05 kg/(m^2).  Lab Results:     Results for orders placed during the hospital encounter of 09/22/13 (from the past 72 hour(s))     COMPREHENSIVE METABOLIC PANEL Status: Abnormal      Collection Time      09/22/13 4:30 PM     Result  Value  Ref Range      Sodium  135 (*)  137 - 147 mEq/L      Potassium  3.5 (*)  3.7 - 5.3 mEq/L      Chloride  95 (*)  96 - 112 mEq/L      CO2  29  19 - 32 mEq/L      Glucose, Bld  98  70 - 99 mg/dL      BUN  10  6 - 23 mg/dL      Creatinine, Ser  0.92  0.50 - 1.35  mg/dL      Calcium  9.6  8.4 - 10.5 mg/dL      Total Protein  7.2  6.0 - 8.3 g/dL      Albumin  3.5  3.5 - 5.2 g/dL      AST  191 (*)  0 - 37 U/L      ALT  201 (*)  0 - 53 U/L  Alkaline Phosphatase  53  39 - 117 U/L      Total Bilirubin  0.4  0.3 - 1.2 mg/dL      GFR calc non Af Amer  >90  >90 mL/min      GFR calc Af Amer  >90  >90 mL/min      Comment:  (NOTE)       The eGFR has been calculated using the CKD EPI equation.       This calculation has not been validated in all clinical situations.       eGFR's persistently <90 mL/min signify possible Chronic Kidney       Disease.     MAGNESIUM Status: None      Collection Time      09/22/13 4:30 PM     Result  Value  Ref Range      Magnesium  1.6  1.5 - 2.5 mg/dL     PHOSPHORUS Status: None      Collection Time      09/22/13 4:30 PM     Result  Value  Ref Range      Phosphorus  3.9  2.3 - 4.6 mg/dL     Physical Findings:  AIMS: , , , ,  CIWA: CIWA-Ar Total: 2  COWS:  Psychiatric Specialty Exam:  See Psychiatric Specialty Exam and Suicide Risk Assessment completed by Attending Physician prior to discharge.  Discharge destination: Other: [place] [place].  Is patient on multiple antipsychotic therapies at discharge: Yes,  Do you recommend tapering to monotherapy for antipsychotics? No  Has Patient had three or more failed trials of antipsychotic monotherapy by history: No  Recommended Plan for Multiple Antipsychotic Therapies:  NA       Future Appointments  Provider  Department  Dept Phone       01/15/2014 3:00 PM  [facility] [place]  [phone]       01/15/2014 3:30 PM  [facility] [place]  [phone]       01/15/2014 4:00 PM  Rosetta Posner, MD  [facility] [place]  [phone]     Medications: Continue current recommendations per psych. Will initate new medications per IM consult.  Follow-up recommendations: Activity: Up with assistance   Diet: Low sodium  Tests: CMET, ETOH  Other: Internal medicine consult  Comments: Spoke with Dr. Randell Loop) who admitted patient to [facility] -2N[facility]. Pt was transported via non-emergency EMS. Will set up pysch consult as needed. Consider adding Haldol, or another anti-psychotic medication.  Total Discharge Time: Greater than 30 minutes.  Signed:  Nanci Pina FNP-BC  09/22/2013, 5:45 PM  I have examined the patient and agree with the findings. Medical consult was requested and Dr. Suella Broad has agreed to transfer patient to telemetry bed for stabilization.

## 2013-10-19 ENCOUNTER — Emergency Department (HOSPITAL_BASED_OUTPATIENT_CLINIC_OR_DEPARTMENT_OTHER)
Admission: EM | Admit: 2013-10-19 | Discharge: 2013-10-19 | Disposition: A | Discharge status: Home / Self Care | Payer: No Typology Code available for payment source | Attending: Emergency Medicine | Admitting: Emergency Medicine

## 2013-10-19 ENCOUNTER — Encounter (HOSPITAL_BASED_OUTPATIENT_CLINIC_OR_DEPARTMENT_OTHER): Payer: Self-pay | Admitting: Emergency Medicine

## 2013-10-19 DIAGNOSIS — H539 Unspecified visual disturbance: Secondary | ICD-10-CM | POA: Insufficient documentation

## 2013-10-19 DIAGNOSIS — Z79899 Other long term (current) drug therapy: Secondary | ICD-10-CM | POA: Insufficient documentation

## 2013-10-19 DIAGNOSIS — E7800 Pure hypercholesterolemia, unspecified: Secondary | ICD-10-CM | POA: Insufficient documentation

## 2013-10-19 DIAGNOSIS — M542 Cervicalgia: Secondary | ICD-10-CM | POA: Insufficient documentation

## 2013-10-19 DIAGNOSIS — F17200 Nicotine dependence, unspecified, uncomplicated: Secondary | ICD-10-CM | POA: Insufficient documentation

## 2013-10-19 DIAGNOSIS — Z85528 Personal history of other malignant neoplasm of kidney: Secondary | ICD-10-CM | POA: Insufficient documentation

## 2013-10-19 DIAGNOSIS — Z791 Long term (current) use of non-steroidal anti-inflammatories (NSAID): Secondary | ICD-10-CM | POA: Insufficient documentation

## 2013-10-19 DIAGNOSIS — R112 Nausea with vomiting, unspecified: Secondary | ICD-10-CM | POA: Insufficient documentation

## 2013-10-19 DIAGNOSIS — I209 Angina pectoris, unspecified: Secondary | ICD-10-CM | POA: Insufficient documentation

## 2013-10-19 DIAGNOSIS — Z880 Allergy status to penicillin: Secondary | ICD-10-CM | POA: Insufficient documentation

## 2013-10-19 DIAGNOSIS — I2510 Atherosclerotic heart disease of native coronary artery without angina pectoris: Secondary | ICD-10-CM | POA: Insufficient documentation

## 2013-10-19 DIAGNOSIS — Z792 Long term (current) use of antibiotics: Secondary | ICD-10-CM | POA: Insufficient documentation

## 2013-10-19 DIAGNOSIS — F319 Bipolar disorder, unspecified: Secondary | ICD-10-CM | POA: Insufficient documentation

## 2013-10-19 DIAGNOSIS — K047 Periapical abscess without sinus: Secondary | ICD-10-CM

## 2013-10-19 DIAGNOSIS — Z8719 Personal history of other diseases of the digestive system: Secondary | ICD-10-CM | POA: Insufficient documentation

## 2013-10-19 DIAGNOSIS — Z853 Personal history of malignant neoplasm of breast: Secondary | ICD-10-CM | POA: Insufficient documentation

## 2013-10-19 DIAGNOSIS — I10 Essential (primary) hypertension: Secondary | ICD-10-CM | POA: Insufficient documentation

## 2013-10-19 MED ORDER — CLINDAMYCIN HCL 150 MG PO CAPS: 150.0000 mg | ORAL_CAPSULE | Freq: Four times a day (QID) | ORAL | Status: DC

## 2013-10-19 MED ORDER — NAPROXEN 250 MG PO TABS
500.0000 mg | ORAL_TABLET | Freq: Once | ORAL | Status: AC
  Administered 2013-10-19: 500 mg via ORAL
  Filled 2013-10-19: qty 2

## 2013-10-19 MED ORDER — IBUPROFEN 800 MG PO TABS
800.0000 mg | ORAL_TABLET | Freq: Once | ORAL | Status: AC
  Administered 2013-10-19: 800 mg via ORAL
  Filled 2013-10-19: qty 1

## 2013-10-19 MED ORDER — NAPROXEN 500 MG PO TABS
500.0000 mg | ORAL_TABLET | Freq: Two times a day (BID) | ORAL | Status: DC
Start: 2013-10-19 — End: 2014-02-06

## 2013-10-19 MED ORDER — CLINDAMYCIN HCL 150 MG PO CAPS
300.0000 mg | ORAL_CAPSULE | Freq: Once | ORAL | Status: AC
  Administered 2013-10-19: 300 mg via ORAL
  Filled 2013-10-19: qty 2

## 2013-10-19 NOTE — ED Notes (Signed)
C/o right lower toothache x 1.5 weeks-states vomiting ans swelling to right side of face started today

## 2013-10-19 NOTE — ED Provider Notes (Signed)
CSN: [id number]     Arrival date & time 10/19/13  1621 History  This chart was scribed for Mervin Kung, MD by Maree Erie, ED Scribe. The patient was seen in room MH10/MH10. [name] care was started at 7:51 PM.    Chief Complaint  Patient presents with  . Dental Pain      Patient is a 33 y.o. male presenting with tooth pain. The history is provided by the patient. No language interpreter was used.  Dental Pain Location:  Lower Onset quality:  Gradual Duration:  1 day Timing:  Constant Chronicity:  Recurrent Relieved by: antibiotics. Associated symptoms: facial swelling, headaches and neck pain   Associated symptoms: no fever     HPI Comments: Emiel Kielty is a 33 y.o. male who presents to the Emergency Department complaining of recurrent, constant right lower molar area pain and swelling that began this morning. He states that he was placed on antibiotics for two weeks for pain to the same tooth. He does not remember which antibiotic he was placed on. He reports transient relief of the pain until today. He reports associated nausea and vomiting this morning as well as subjective fever, headache, and neck pain from the headache. He also states his vision has been "weaker." He denies chest pain, shortness of breath, abdoinal pain, dysuria, leg swelling, rash or back pain. He has an allergy to Pencillin.    PCP [facility]  Past Medical History  Diagnosis Date  . Seizures   . Hypertension   . Depression   . Pancreatitis   . Liver cirrhosis   . Coronary artery disease   . Angina   . Shortness of breath   . Headache(784.0)   . Peripheral vascular disease April 2011    Left Pop  . Hypercholesteremia   . Schizophrenia   . Bipolar 1 disorder   . Cancer of kidney   . Breast CA    Past Surgical History  Procedure Laterality Date  . Chest surgery    . Left leg surgery    . Mastectomy    . Left kidney removal     Family History  Problem Relation  Age of Onset  . Stroke Other    History  Substance Use Topics  . Smoking status: Current Every Day Smoker -- 0.10 packs/day for 4 years    Types: Cigarettes  . Smokeless tobacco: Never Used  . Alcohol Use: Not on file     Comment: at [facility] for ETOH and cocaine    Review of Systems  Constitutional: Negative for fever and chills.  HENT: Positive for dental problem and facial swelling. Negative for rhinorrhea.   Eyes: Positive for visual disturbance.  Respiratory: Negative for cough and shortness of breath.   Cardiovascular: Negative for chest pain and leg swelling.  Gastrointestinal: Positive for nausea and vomiting. Negative for abdominal pain and diarrhea.  Genitourinary: Negative for dysuria.  Musculoskeletal: Positive for neck pain. Negative for back pain.  Skin: Negative for rash.  Neurological: Positive for headaches.  Hematological: Does not bruise/bleed easily.  Psychiatric/Behavioral: Negative for confusion.      Allergies  Codeine; Depakote; Morphine; Penicillins; and Hydrocodone-acetaminophen  Home Medications   Current Outpatient Rx  Name  Route  Sig  Dispense  Refill  . amantadine (SYMMETREL) 100 MG capsule   Oral   Take 100 mg by mouth 3 (three) times daily.         Marland Kitchen ibuprofen (ADVIL,MOTRIN) 800 MG tablet  Oral   Take 800 mg by mouth every 8 (eight) hours as needed.         . mirtazapine (REMERON SOL-TAB) 15 MG disintegrating tablet   Oral   Take 15 mg by mouth at bedtime.         . Prenatal Vit-Fe Fumarate-FA (M-VIT) tablet   Oral   Take 1 tablet by mouth daily.         . traZODone (DESYREL) 100 MG tablet   Oral   Take 100 mg by mouth at bedtime.         Marland Kitchen amLODipine (NORVASC) 5 MG tablet   Oral   Take 5 mg by mouth daily.         . ARIPiprazole (ABILIFY) 2 MG tablet   Oral   Take 4 mg by mouth at bedtime.         . clindamycin (CLEOCIN) 150 MG capsule   Oral   Take 1 capsule (150 mg total) by mouth every 6 (six)  hours.   28 capsule   0   . diphenhydrAMINE (BENADRYL) 25 MG tablet   Oral   Take 25 mg by mouth every 6 (six) hours as needed for itching.         . DULoxetine (CYMBALTA) 30 MG capsule   Oral   Take 30 mg by mouth daily.         Marland Kitchen LORazepam (ATIVAN) 0.5 MG tablet   Oral   Take 0.5 mg by mouth every 6 (six) hours as needed for anxiety.          . metoprolol tartrate (LOPRESSOR) 25 MG tablet   Oral   Take 25 mg by mouth 2 (two) times daily.         . naproxen (NAPROSYN) 500 MG tablet   Oral   Take 1 tablet (500 mg total) by mouth 2 (two) times daily.   14 tablet   1   . ramelteon (ROZEREM) 8 MG tablet   Oral   Take 8 mg by mouth at bedtime.         . simvastatin (ZOCOR) 40 MG tablet   Oral   Take 40 mg by mouth daily.          Triage Vitals: BP 120/87  Pulse 85  Temp(Src) 98.7 F (37.1 C) (Oral)  Resp 16  Ht 5\' 6"  (1.676 m)  Wt 160 lb (72.576 kg)  BMI 25.84 kg/m2  SpO2 100%  Physical Exam  Nursing note and vitals reviewed. Constitutional: He is oriented to person, place, and time.  HENT:  Swelling of gum around second molar area, right side, lower, with facial swelling in right cheek.  Cardiovascular: Normal rate and regular rhythm.   No murmur heard. Pulmonary/Chest: Effort normal and breath sounds normal. No respiratory distress. He has no wheezes. He has no rales.  Abdominal: Bowel sounds are normal.  Musculoskeletal: He exhibits no edema.  No ankle swelling  Lymphadenopathy:    He has no cervical adenopathy.  Neurological: He is alert and oriented to person, place, and time.  Skin: Skin is warm and dry.    ED Course  Procedures (including critical care time)  DIAGNOSTIC STUDIES: Oxygen Saturation is 100% on room air, normal by my interpretation.    COORDINATION OF CARE: 7:55 PM -patient reports that he is unable to take narcotics. Will give patient Naprosyn for pain and discharge with Clindamycin. Patient verbalizes understanding and  agrees with treatment plan.   Medications  ibuprofen (ADVIL,MOTRIN) tablet 800 mg (800 mg Oral Given 10/19/13 1853)  clindamycin (CLEOCIN) capsule 300 mg (300 mg Oral Given 10/19/13 2008)  naproxen (NAPROSYN) tablet 500 mg (500 mg Oral Given 10/19/13 2008)      Labs Review Labs Reviewed - No data to display Imaging Review No results found.   EKG Interpretation None      MDM   Final diagnoses:  Tooth abscess    Patient with swelling to the right cheek area. Has some gum swelling to the lower part of the second molar. No swelling to the floor the mouth. Patient stopped an antibiotic course a week ago and now this has reoccurred. Single-tooth. Followup with Simona Huh will be important. We'll treat with clindamycin this time with anti-inflammatory meds. Patient not able to take narcotic meds.  I personally performed the services described in this documentation, which was scribed in my presence. The recorded information has been reviewed and is accurate.     Mervin Kung, MD 10/22/13 [phone]

## 2013-10-19 NOTE — Discharge Instructions (Signed)
Abscessed Tooth An abscessed tooth is an infection around your tooth. It may be caused by holes or damage to the tooth (cavity) or a dental disease. An abscessed tooth causes mild to very bad pain in and around the tooth. See your dentist right away if you have tooth or gum pain. HOME CARE  Take your medicine as told. Finish it even if you start to feel better.  Do not drive after taking pain medicine.  Rinse your mouth (gargle) often with salt water ( teaspoon salt in 8 ounces of warm water).  Do not apply heat to the outside of your face. GET HELP RIGHT AWAY IF:   You have a temperature by mouth above 102 F (38.9 C), not controlled by medicine.  You have chills and a very bad headache.  You have problems breathing or swallowing.  Your mouth will not open.  You develop puffiness (swelling) on the neck or around the eye.  Your pain is not helped by medicine.  Your pain is getting worse instead of better. MAKE SURE YOU:   Understand these instructions.  Will watch your condition.  Will get help right away if you are not doing well or get worse. Document Released: 12/22/2007 Document Revised: 09/27/2011 Document Reviewed: 10/13/2010 [facility] Patient Information 2014 [place].  Dental Care and Dentist Visits Dental care supports good overall health. Regular dental visits can also help you avoid dental pain, bleeding, infection, and other more serious health problems in the future. It is important to keep the mouth healthy because diseases in the teeth, gums, and other oral tissues can spread to other areas of the body. Some problems, such as diabetes, heart disease, and pre-term labor have been associated with poor oral health.  See your dentist every 6 months. If you experience emergency problems such as a toothache or broken tooth, go to the dentist right away. If you see your dentist regularly, you may catch problems early. It is easier to be treated for problems in  the early stages.  WHAT TO EXPECT AT A DENTIST VISIT  Your dentist will look for many common oral health problems and recommend proper treatment. At your regular dental visit, you can expect:  Gentle cleaning of the teeth and gums. This includes scraping and polishing. This helps to remove the sticky substance around the teeth and gums (plaque). Plaque forms in the mouth shortly after eating. Over time, plaque hardens on the teeth as tartar. If tartar is not removed regularly, it can cause problems. Cleaning also helps remove stains.  Periodic X-rays. These pictures of the teeth and supporting bone will help your dentist assess the health of your teeth.  Periodic fluoride treatments. Fluoride is a natural mineral shown to help strengthen teeth. Fluoride treatmentinvolves applying a fluoride gel or varnish to the teeth. It is most commonly done in children.  Examination of the mouth, tongue, jaws, teeth, and gums to look for any oral health problems, such as:  Cavities (dental caries). This is decay on the tooth caused by plaque, sugar, and acid in the mouth. It is best to catch a cavity when it is small.  Inflammation of the gums caused by plaque buildup (gingivitis).  Problems with the mouth or malformed or misaligned teeth.  Oral cancer or other diseases of the soft tissues or jaws. KEEP YOUR TEETH AND GUMS HEALTHY For healthy teeth and gums, follow these general guidelines as well as your dentist's specific advice:  Have your teeth professionally cleaned at  the dentist every 6 months.  Brush twice daily with a fluoride toothpaste.  Floss your teeth daily.  Ask your dentist if you need fluoride supplements, treatments, or fluoride toothpaste.  Eat a healthy diet. Reduce foods and drinks with added sugar.  Avoid smoking. TREATMENT FOR ORAL HEALTH PROBLEMS If you have oral health problems, treatment varies depending on the conditions present in your teeth and gums.  Your  caregiver will most likely recommend good oral hygiene at each visit.  For cavities, gingivitis, or other oral health disease, your caregiver will perform a procedure to treat the problem. This is typically done at a separate appointment. Sometimes your caregiver will refer you to another dental specialist for specific tooth problems or for surgery. SEEK IMMEDIATE DENTAL CARE IF:  You have pain, bleeding, or soreness in the gum, tooth, jaw, or mouth area.  A permanent tooth becomes loose or separated from the gum socket.  You experience a blow or injury to the mouth or jaw area. Document Released: 03/17/2011 Document Revised: 09/27/2011 Document Reviewed: 03/17/2011 [facility] Patient Information 2014 [place], [place].  Take anti-inflammatory medicine as directed. Take antibiotic as directed. Make a plan to followup with dentist. Return for any new or worse symptoms. Return for any swelling to the floor the mouth.

## 2014-01-14 ENCOUNTER — Encounter: Payer: Self-pay | Admitting: Family

## 2014-01-15 ENCOUNTER — Inpatient Hospital Stay (HOSPITAL_COMMUNITY): Admission: RE | Admit: 2014-01-15 | Payer: Self-pay | Source: Ambulatory Visit

## 2014-01-15 ENCOUNTER — Ambulatory Visit: Payer: Self-pay | Admitting: Family

## 2014-01-27 ENCOUNTER — Emergency Department (HOSPITAL_COMMUNITY)
Admission: EM | Admit: 2014-01-27 | Discharge: 2014-01-28 | Disposition: A | Discharge status: Other Acute Inpatient Hospital | Payer: No Typology Code available for payment source | Attending: Emergency Medicine | Admitting: Emergency Medicine

## 2014-01-27 ENCOUNTER — Encounter (HOSPITAL_COMMUNITY): Payer: Self-pay | Admitting: Emergency Medicine

## 2014-01-27 DIAGNOSIS — F10230 Alcohol dependence with withdrawal, uncomplicated: Secondary | ICD-10-CM

## 2014-01-27 DIAGNOSIS — I10 Essential (primary) hypertension: Secondary | ICD-10-CM | POA: Insufficient documentation

## 2014-01-27 DIAGNOSIS — F209 Schizophrenia, unspecified: Secondary | ICD-10-CM | POA: Insufficient documentation

## 2014-01-27 DIAGNOSIS — F1010 Alcohol abuse, uncomplicated: Secondary | ICD-10-CM | POA: Insufficient documentation

## 2014-01-27 DIAGNOSIS — F17200 Nicotine dependence, unspecified, uncomplicated: Secondary | ICD-10-CM | POA: Insufficient documentation

## 2014-01-27 DIAGNOSIS — F10929 Alcohol use, unspecified with intoxication, unspecified: Secondary | ICD-10-CM

## 2014-01-27 DIAGNOSIS — Z85528 Personal history of other malignant neoplasm of kidney: Secondary | ICD-10-CM | POA: Insufficient documentation

## 2014-01-27 DIAGNOSIS — F10920 Alcohol use, unspecified with intoxication, uncomplicated: Secondary | ICD-10-CM

## 2014-01-27 DIAGNOSIS — Z791 Long term (current) use of non-steroidal anti-inflammatories (NSAID): Secondary | ICD-10-CM | POA: Insufficient documentation

## 2014-01-27 DIAGNOSIS — E7800 Pure hypercholesterolemia, unspecified: Secondary | ICD-10-CM | POA: Insufficient documentation

## 2014-01-27 DIAGNOSIS — T43294A Poisoning by other antidepressants, undetermined, initial encounter: Secondary | ICD-10-CM | POA: Insufficient documentation

## 2014-01-27 DIAGNOSIS — R7402 Elevation of levels of lactic acid dehydrogenase (LDH): Secondary | ICD-10-CM | POA: Insufficient documentation

## 2014-01-27 DIAGNOSIS — Z8719 Personal history of other diseases of the digestive system: Secondary | ICD-10-CM | POA: Insufficient documentation

## 2014-01-27 DIAGNOSIS — T518X1A Toxic effect of other alcohols, accidental (unintentional), initial encounter: Secondary | ICD-10-CM | POA: Insufficient documentation

## 2014-01-27 DIAGNOSIS — R740 Nonspecific elevation of levels of transaminase and lactic acid dehydrogenase [LDH]: Secondary | ICD-10-CM

## 2014-01-27 DIAGNOSIS — G40909 Epilepsy, unspecified, not intractable, without status epilepticus: Secondary | ICD-10-CM | POA: Insufficient documentation

## 2014-01-27 DIAGNOSIS — Z880 Allergy status to penicillin: Secondary | ICD-10-CM | POA: Insufficient documentation

## 2014-01-27 DIAGNOSIS — I2510 Atherosclerotic heart disease of native coronary artery without angina pectoris: Secondary | ICD-10-CM | POA: Insufficient documentation

## 2014-01-27 DIAGNOSIS — T438X2A Poisoning by other psychotropic drugs, intentional self-harm, initial encounter: Secondary | ICD-10-CM | POA: Insufficient documentation

## 2014-01-27 DIAGNOSIS — T6592XA Toxic effect of unspecified substance, intentional self-harm, initial encounter: Secondary | ICD-10-CM | POA: Insufficient documentation

## 2014-01-27 DIAGNOSIS — F10220 Alcohol dependence with intoxication, uncomplicated: Secondary | ICD-10-CM

## 2014-01-27 DIAGNOSIS — I209 Angina pectoris, unspecified: Secondary | ICD-10-CM | POA: Insufficient documentation

## 2014-01-27 DIAGNOSIS — T50902A Poisoning by unspecified drugs, medicaments and biological substances, intentional self-harm, initial encounter: Secondary | ICD-10-CM

## 2014-01-27 DIAGNOSIS — Z79899 Other long term (current) drug therapy: Secondary | ICD-10-CM | POA: Insufficient documentation

## 2014-01-27 DIAGNOSIS — T518X4A Toxic effect of other alcohols, undetermined, initial encounter: Secondary | ICD-10-CM | POA: Insufficient documentation

## 2014-01-27 DIAGNOSIS — R7401 Elevation of levels of liver transaminase levels: Secondary | ICD-10-CM | POA: Insufficient documentation

## 2014-01-27 DIAGNOSIS — T43502A Poisoning by unspecified antipsychotics and neuroleptics, intentional self-harm, initial encounter: Secondary | ICD-10-CM | POA: Insufficient documentation

## 2014-01-27 DIAGNOSIS — F10930 Alcohol use, unspecified with withdrawal, uncomplicated: Secondary | ICD-10-CM

## 2014-01-27 DIAGNOSIS — F319 Bipolar disorder, unspecified: Secondary | ICD-10-CM | POA: Insufficient documentation

## 2014-01-27 DIAGNOSIS — Z853 Personal history of malignant neoplasm of breast: Secondary | ICD-10-CM | POA: Insufficient documentation

## 2014-01-27 LAB — CBC
HCT: 44.4 % (ref 39.0–52.0)
HEMOGLOBIN: 15.8 g/dL (ref 13.0–17.0)
MCH: 31.4 pg (ref 26.0–34.0)
MCHC: 35.6 g/dL (ref 30.0–36.0)
MCV: 88.3 fL (ref 78.0–100.0)
Platelets: 87 10*3/uL — ABNORMAL LOW (ref 150–400)
RBC: 5.03 MIL/uL (ref 4.22–5.81)
RDW: 16.1 % — ABNORMAL HIGH (ref 11.5–15.5)
WBC: 4.1 10*3/uL (ref 4.0–10.5)

## 2014-01-27 LAB — SALICYLATE LEVEL: Salicylate Lvl: <2 mg/dL — ABNORMAL LOW (ref 2.8–20.0)

## 2014-01-27 LAB — COMPREHENSIVE METABOLIC PANEL
ALK PHOS: 82 U/L (ref 39–117)
ALT: 398 U/L — AB (ref 0–53)
AST: 571 U/L — ABNORMAL HIGH (ref 0–37)
Albumin: 4.4 g/dL (ref 3.5–5.2)
Anion gap: 24 — ABNORMAL HIGH (ref 5–15)
BUN: 6 mg/dL (ref 6–23)
CHLORIDE: 96 meq/L (ref 96–112)
CO2: 20 meq/L (ref 19–32)
Calcium: 9.5 mg/dL (ref 8.4–10.5)
Creatinine, Ser: 0.74 mg/dL (ref 0.50–1.35)
GFR calc non Af Amer: >90 mL/min (ref >90)
GLUCOSE: 76 mg/dL (ref 70–99)
POTASSIUM: 4 meq/L (ref 3.7–5.3)
SODIUM: 140 meq/L (ref 137–147)
Total Bilirubin: 0.4 mg/dL (ref 0.3–1.2)
Total Protein: 8.2 g/dL (ref 6.0–8.3)

## 2014-01-27 LAB — RAPID URINE DRUG SCREEN, HOSP PERFORMED
Amphetamines: NOT DETECTED
BARBITURATES: NOT DETECTED
Benzodiazepines: NOT DETECTED
COCAINE: NOT DETECTED
Opiates: NOT DETECTED
TETRAHYDROCANNABINOL: NOT DETECTED

## 2014-01-27 LAB — ACETAMINOPHEN LEVEL: Acetaminophen (Tylenol), Serum: <15 ug/mL (ref 10–30)

## 2014-01-27 LAB — ETHANOL: Alcohol, Ethyl (B): 418 mg/dL (ref 0–11)

## 2014-01-27 MED ORDER — SODIUM CHLORIDE 0.9 % IV BOLUS (SEPSIS)
1000.0000 mL | Freq: Once | INTRAVENOUS | Status: AC
  Administered 2014-01-27: 1000 mL via INTRAVENOUS

## 2014-01-27 MED ORDER — IBUPROFEN 200 MG PO TABS
400.0000 mg | ORAL_TABLET | Freq: Once | ORAL | Status: AC
  Administered 2014-01-28: 400 mg via ORAL
  Filled 2014-01-27 (×2): qty 2

## 2014-01-27 NOTE — ED Notes (Signed)
ETOH 418, RN notified, no MD assigned at this time
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
## 2014-01-27 NOTE — ED Notes (Signed)
Bed: WA10 Expected date:  Expected time:  Means of arrival:  Comments: Triage 7 

## 2014-01-27 NOTE — ED Notes (Signed)
Per pt report: pt drank about 4 bottles of wine and took 8 200mg  of trazodone pills in an attempt to kill himself.  Pt still endorses desire attempt SI by taking pills.

## 2014-01-27 NOTE — ED Notes (Signed)
Pt Brother Remo Lipps [phone]. Mother [phone]. Brother will pick up pt if discharged

## 2014-01-28 ENCOUNTER — Encounter (HOSPITAL_COMMUNITY): Payer: Self-pay | Admitting: *Deleted

## 2014-01-28 ENCOUNTER — Encounter (HOSPITAL_COMMUNITY): Payer: Self-pay | Admitting: Registered Nurse

## 2014-01-28 ENCOUNTER — Other Ambulatory Visit: Payer: Self-pay

## 2014-01-28 ENCOUNTER — Inpatient Hospital Stay (HOSPITAL_COMMUNITY)
Admission: EM | Admit: 2014-01-28 | Discharge: 2014-02-06 | DRG: 885 | Disposition: A | Discharge status: Home / Self Care | Payer: No Typology Code available for payment source | Source: Intra-hospital | Attending: Emergency Medicine | Admitting: Emergency Medicine

## 2014-01-28 DIAGNOSIS — F1020 Alcohol dependence, uncomplicated: Secondary | ICD-10-CM | POA: Diagnosis present

## 2014-01-28 DIAGNOSIS — Z823 Family history of stroke: Secondary | ICD-10-CM | POA: Diagnosis not present

## 2014-01-28 DIAGNOSIS — R45851 Suicidal ideations: Secondary | ICD-10-CM | POA: Diagnosis not present

## 2014-01-28 DIAGNOSIS — G4700 Insomnia, unspecified: Secondary | ICD-10-CM | POA: Diagnosis present

## 2014-01-28 DIAGNOSIS — K7460 Unspecified cirrhosis of liver: Secondary | ICD-10-CM | POA: Diagnosis present

## 2014-01-28 DIAGNOSIS — E7800 Pure hypercholesterolemia, unspecified: Secondary | ICD-10-CM | POA: Diagnosis present

## 2014-01-28 DIAGNOSIS — F411 Generalized anxiety disorder: Secondary | ICD-10-CM | POA: Diagnosis present

## 2014-01-28 DIAGNOSIS — I10 Essential (primary) hypertension: Secondary | ICD-10-CM | POA: Diagnosis present

## 2014-01-28 DIAGNOSIS — F410 Panic disorder [episodic paroxysmal anxiety] without agoraphobia: Secondary | ICD-10-CM | POA: Diagnosis present

## 2014-01-28 DIAGNOSIS — F339 Major depressive disorder, recurrent, unspecified: Secondary | ICD-10-CM | POA: Diagnosis present

## 2014-01-28 DIAGNOSIS — R109 Unspecified abdominal pain: Secondary | ICD-10-CM | POA: Diagnosis present

## 2014-01-28 DIAGNOSIS — F17200 Nicotine dependence, unspecified, uncomplicated: Secondary | ICD-10-CM | POA: Diagnosis present

## 2014-01-28 DIAGNOSIS — F209 Schizophrenia, unspecified: Secondary | ICD-10-CM | POA: Diagnosis present

## 2014-01-28 DIAGNOSIS — F1014 Alcohol abuse with alcohol-induced mood disorder: Secondary | ICD-10-CM

## 2014-01-28 DIAGNOSIS — R079 Chest pain, unspecified: Secondary | ICD-10-CM | POA: Diagnosis present

## 2014-01-28 DIAGNOSIS — F4310 Post-traumatic stress disorder, unspecified: Secondary | ICD-10-CM | POA: Diagnosis present

## 2014-01-28 DIAGNOSIS — I739 Peripheral vascular disease, unspecified: Secondary | ICD-10-CM | POA: Diagnosis present

## 2014-01-28 DIAGNOSIS — I2510 Atherosclerotic heart disease of native coronary artery without angina pectoris: Secondary | ICD-10-CM | POA: Diagnosis present

## 2014-01-28 DIAGNOSIS — Z598 Other problems related to housing and economic circumstances: Secondary | ICD-10-CM

## 2014-01-28 DIAGNOSIS — Z5987 Material hardship due to limited financial resources, not elsewhere classified: Secondary | ICD-10-CM

## 2014-01-28 DIAGNOSIS — F10259 Alcohol dependence with alcohol-induced psychotic disorder, unspecified: Secondary | ICD-10-CM

## 2014-01-28 DIAGNOSIS — R42 Dizziness and giddiness: Secondary | ICD-10-CM

## 2014-01-28 DIAGNOSIS — F1994 Other psychoactive substance use, unspecified with psychoactive substance-induced mood disorder: Secondary | ICD-10-CM | POA: Diagnosis present

## 2014-01-28 DIAGNOSIS — F332 Major depressive disorder, recurrent severe without psychotic features: Principal | ICD-10-CM | POA: Diagnosis present

## 2014-01-28 DIAGNOSIS — F319 Bipolar disorder, unspecified: Secondary | ICD-10-CM | POA: Diagnosis present

## 2014-01-28 DIAGNOSIS — R7401 Elevation of levels of liver transaminase levels: Secondary | ICD-10-CM

## 2014-01-28 DIAGNOSIS — R740 Nonspecific elevation of levels of transaminase and lactic acid dehydrogenase [LDH]: Secondary | ICD-10-CM

## 2014-01-28 LAB — I-STAT TROPONIN, ED: TROPONIN I, POC: 0.01 ng/mL (ref 0.00–0.08)

## 2014-01-28 LAB — ETHANOL: ALCOHOL ETHYL (B): 317 mg/dL — AB (ref 0–11)

## 2014-01-28 MED ORDER — VITAMIN B-1 100 MG PO TABS
100.0000 mg | ORAL_TABLET | Freq: Every day | ORAL | Status: DC
  Administered 2014-01-29 – 2014-02-06 (×9): 100 mg via ORAL
  Filled 2014-01-28 (×14): qty 1

## 2014-01-28 MED ORDER — METOPROLOL TARTRATE 25 MG PO TABS
25.0000 mg | ORAL_TABLET | Freq: Two times a day (BID) | ORAL | Status: DC
  Administered 2014-01-28 – 2014-01-30 (×5): 25 mg via ORAL
  Filled 2014-01-28 (×13): qty 1

## 2014-01-28 MED ORDER — LORAZEPAM 1 MG PO TABS
1.0000 mg | ORAL_TABLET | Freq: Four times a day (QID) | ORAL | Status: AC
  Administered 2014-01-28 – 2014-01-30 (×5): 1 mg via ORAL
  Filled 2014-01-28 (×6): qty 1

## 2014-01-28 MED ORDER — LORAZEPAM 1 MG PO TABS: 1.0000 mg | ORAL_TABLET | Freq: Every day | ORAL | Status: DC

## 2014-01-28 MED ORDER — ALUM & MAG HYDROXIDE-SIMETH 200-200-20 MG/5ML PO SUSP: 30.0000 mL | ORAL | Status: DC | PRN

## 2014-01-28 MED ORDER — LORAZEPAM 1 MG PO TABS: 1.0000 mg | ORAL_TABLET | Freq: Two times a day (BID) | ORAL | Status: DC

## 2014-01-28 MED ORDER — ONDANSETRON 4 MG PO TBDP
4.0000 mg | ORAL_TABLET | Freq: Four times a day (QID) | ORAL | Status: DC | PRN
  Administered 2014-01-28 (×2): 4 mg via ORAL
  Filled 2014-01-28 (×2): qty 1

## 2014-01-28 MED ORDER — DIPHENHYDRAMINE HCL 50 MG/ML IJ SOLN
25.0000 mg | Freq: Once | INTRAMUSCULAR | Status: AC
Start: 2014-01-28 — End: 2014-01-28
  Administered 2014-01-28: 25 mg via INTRAVENOUS
  Filled 2014-01-28: qty 1

## 2014-01-28 MED ORDER — ADULT MULTIVITAMIN W/MINERALS CH
1.0000 | ORAL_TABLET | Freq: Every day | ORAL | Status: DC
  Administered 2014-01-29 – 2014-02-06 (×9): 1 via ORAL
  Filled 2014-01-28 (×14): qty 1

## 2014-01-28 MED ORDER — ADULT MULTIVITAMIN W/MINERALS CH
1.0000 | ORAL_TABLET | Freq: Every day | ORAL | Status: DC
  Filled 2014-01-28: qty 1

## 2014-01-28 MED ORDER — LORAZEPAM 1 MG PO TABS
1.0000 mg | ORAL_TABLET | Freq: Four times a day (QID) | ORAL | Status: AC | PRN
  Administered 2014-01-30 (×3): 1 mg via ORAL
  Filled 2014-01-28 (×4): qty 1

## 2014-01-28 MED ORDER — GI COCKTAIL ~~LOC~~
30.0000 mL | Freq: Once | ORAL | Status: AC
  Administered 2014-01-28: 30 mL via ORAL
  Filled 2014-01-28: qty 30

## 2014-01-28 MED ORDER — AMLODIPINE BESYLATE 5 MG PO TABS
5.0000 mg | ORAL_TABLET | Freq: Every day | ORAL | Status: DC
  Administered 2014-01-29 – 2014-02-06 (×9): 5 mg via ORAL
  Filled 2014-01-28 (×14): qty 1

## 2014-01-28 MED ORDER — LORAZEPAM 1 MG PO TABS
1.0000 mg | ORAL_TABLET | Freq: Three times a day (TID) | ORAL | Status: DC
  Administered 2014-01-30: 1 mg via ORAL
  Filled 2014-01-28: qty 1

## 2014-01-28 MED ORDER — ONDANSETRON 4 MG PO TBDP
4.0000 mg | ORAL_TABLET | Freq: Once | ORAL | Status: AC
Start: 2014-01-28 — End: 2014-01-28
  Administered 2014-01-28: 4 mg via ORAL
  Filled 2014-01-28: qty 1

## 2014-01-28 MED ORDER — THIAMINE HCL 100 MG/ML IJ SOLN
100.0000 mg | Freq: Once | INTRAMUSCULAR | Status: AC
  Administered 2014-01-28: 100 mg via INTRAMUSCULAR
  Filled 2014-01-28: qty 2

## 2014-01-28 MED ORDER — TRAZODONE HCL 100 MG PO TABS
100.0000 mg | ORAL_TABLET | Freq: Every day | ORAL | Status: DC
  Administered 2014-01-28 – 2014-01-31 (×4): 100 mg via ORAL
  Filled 2014-01-28 (×7): qty 1

## 2014-01-28 MED ORDER — HYDROXYZINE HCL 25 MG PO TABS
25.0000 mg | ORAL_TABLET | Freq: Four times a day (QID) | ORAL | Status: AC | PRN
  Administered 2014-01-29 – 2014-01-31 (×2): 25 mg via ORAL
  Filled 2014-01-28 (×2): qty 1

## 2014-01-28 MED ORDER — DULOXETINE HCL 30 MG PO CPEP
30.0000 mg | ORAL_CAPSULE | Freq: Every day | ORAL | Status: DC
  Administered 2014-01-29 – 2014-02-06 (×9): 30 mg via ORAL
  Filled 2014-01-28 (×7): qty 1
  Filled 2014-01-28: qty 14
  Filled 2014-01-28 (×6): qty 1

## 2014-01-28 MED ORDER — LORAZEPAM 1 MG PO TABS: 1.0000 mg | ORAL_TABLET | Freq: Three times a day (TID) | ORAL | Status: DC

## 2014-01-28 MED ORDER — ARIPIPRAZOLE 2 MG PO TABS
4.0000 mg | ORAL_TABLET | Freq: Every day | ORAL | Status: DC
  Administered 2014-01-28 – 2014-01-29 (×2): 4 mg via ORAL
  Filled 2014-01-28 (×5): qty 2

## 2014-01-28 MED ORDER — ONDANSETRON 8 MG/NS 50 ML IVPB
8.0000 mg | Freq: Once | INTRAVENOUS | Status: AC
  Administered 2014-01-28: 8 mg via INTRAVENOUS
  Filled 2014-01-28: qty 8

## 2014-01-28 MED ORDER — HYDROXYZINE HCL 25 MG PO TABS
25.0000 mg | ORAL_TABLET | Freq: Four times a day (QID) | ORAL | Status: DC | PRN
  Administered 2014-01-28: 25 mg via ORAL
  Filled 2014-01-28 (×2): qty 1

## 2014-01-28 MED ORDER — LOPERAMIDE HCL 2 MG PO CAPS: 2.0000 mg | ORAL_CAPSULE | ORAL | Status: AC | PRN

## 2014-01-28 MED ORDER — LORAZEPAM 1 MG PO TABS
1.0000 mg | ORAL_TABLET | Freq: Four times a day (QID) | ORAL | Status: DC
  Administered 2014-01-28 (×3): 1 mg via ORAL
  Filled 2014-01-28 (×3): qty 1

## 2014-01-28 MED ORDER — SODIUM CHLORIDE 0.9 % IV BOLUS (SEPSIS)
500.0000 mL | Freq: Once | INTRAVENOUS | Status: AC
  Administered 2014-01-28: 500 mL via INTRAVENOUS

## 2014-01-28 MED ORDER — MIRTAZAPINE 15 MG PO TBDP
15.0000 mg | ORAL_TABLET | Freq: Every day | ORAL | Status: DC
  Administered 2014-01-28 – 2014-02-05 (×9): 15 mg via ORAL
  Filled 2014-01-28 (×5): qty 1
  Filled 2014-01-28: qty 14
  Filled 2014-01-28 (×8): qty 1

## 2014-01-28 MED ORDER — AMANTADINE HCL 100 MG PO CAPS
100.0000 mg | ORAL_CAPSULE | Freq: Three times a day (TID) | ORAL | Status: DC
  Administered 2014-01-29 – 2014-02-01 (×9): 100 mg via ORAL
  Filled 2014-01-28 (×17): qty 1

## 2014-01-28 MED ORDER — KETOROLAC TROMETHAMINE 30 MG/ML IJ SOLN
15.0000 mg | Freq: Once | INTRAMUSCULAR | Status: AC
  Administered 2014-01-28: 15 mg via INTRAVENOUS
  Filled 2014-01-28: qty 1

## 2014-01-28 MED ORDER — LORAZEPAM 1 MG PO TABS
1.0000 mg | ORAL_TABLET | Freq: Four times a day (QID) | ORAL | Status: DC | PRN
  Administered 2014-01-28: 1 mg via ORAL
  Filled 2014-01-28: qty 1

## 2014-01-28 MED ORDER — MAGNESIUM HYDROXIDE 400 MG/5ML PO SUSP: 30.0000 mL | Freq: Every day | ORAL | Status: DC | PRN

## 2014-01-28 MED ORDER — DIPHENHYDRAMINE HCL 25 MG PO TABS
25.0000 mg | ORAL_TABLET | Freq: Four times a day (QID) | ORAL | Status: DC | PRN
  Administered 2014-01-29 – 2014-02-05 (×3): 25 mg via ORAL
  Filled 2014-01-28 (×4): qty 1

## 2014-01-28 MED ORDER — SIMVASTATIN 40 MG PO TABS: 40.0000 mg | ORAL_TABLET | Freq: Every day | ORAL | Status: DC

## 2014-01-28 MED ORDER — ATORVASTATIN CALCIUM 20 MG PO TABS
20.0000 mg | ORAL_TABLET | Freq: Every day | ORAL | Status: DC
  Administered 2014-01-29 – 2014-02-05 (×8): 20 mg via ORAL
  Filled 2014-01-28: qty 2
  Filled 2014-01-28 (×3): qty 1
  Filled 2014-01-28: qty 2
  Filled 2014-01-28 (×4): qty 1
  Filled 2014-01-28: qty 2
  Filled 2014-01-28 (×3): qty 1

## 2014-01-28 MED ORDER — METOCLOPRAMIDE HCL 5 MG/ML IJ SOLN
10.0000 mg | Freq: Once | INTRAMUSCULAR | Status: AC
  Administered 2014-01-28: 10 mg via INTRAVENOUS
  Filled 2014-01-28: qty 2

## 2014-01-28 MED ORDER — VITAMIN B-1 100 MG PO TABS: 100.0000 mg | ORAL_TABLET | Freq: Every day | ORAL | Status: DC

## 2014-01-28 MED ORDER — LOPERAMIDE HCL 2 MG PO CAPS: 2.0000 mg | ORAL_CAPSULE | ORAL | Status: DC | PRN

## 2014-01-28 MED ORDER — DIPHENHYDRAMINE HCL 50 MG/ML IJ SOLN
25.0000 mg | Freq: Once | INTRAMUSCULAR | Status: AC
  Administered 2014-01-28: 25 mg via INTRAVENOUS
  Filled 2014-01-28: qty 1

## 2014-01-28 MED ORDER — THIAMINE HCL 100 MG/ML IJ SOLN: 100.0000 mg | Freq: Once | INTRAMUSCULAR | Status: DC

## 2014-01-28 MED ORDER — ONDANSETRON 4 MG PO TBDP
4.0000 mg | ORAL_TABLET | Freq: Four times a day (QID) | ORAL | Status: AC | PRN
  Administered 2014-01-29 – 2014-01-30 (×3): 4 mg via ORAL
  Filled 2014-01-28 (×3): qty 1
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
## 2014-01-28 NOTE — ED Notes (Signed)
Pt in bigeminy, MD shown EKG strips. Pt actively vomiting, small amount of emesis noted. Pt medicated per MD order

## 2014-01-28 NOTE — ED Notes (Signed)
GI cocktail given as per MD order, patient vomited this up.  MD notified.

## 2014-01-28 NOTE — BH Assessment (Signed)
Received call for assessment. Spoke with Dr. Florina Ou who said Pt arrived extremely intoxicated and says he is suicidal and hears voices but tends to change his story. Tele-assessment will be initiated.  Orpah Greek Rosana Hoes, [facility] Triage Specialist [phone]

## 2014-01-28 NOTE — ED Notes (Signed)
SANE RN Debroah Baller states pt does not want to continue with exam.
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
## 2014-01-28 NOTE — BH Assessment (Signed)
Assessment complete. Inocencio Homes, [facility], confirmed adult unit is at capacity. Gave clinical report to Serena Colonel, NP who said Pt meets criteria for inpatient psychiatric treatment and second blood alcohol needs to be completed before Pt is placed. Discussed recommendation with Dr. Florina Ou.  Orpah Greek Rosana Hoes, [facility] Triage Specialist [phone]

## 2014-01-28 NOTE — ED Notes (Signed)
SANE SPEAKING WITH PT
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
## 2014-01-28 NOTE — Progress Notes (Signed)
[facility] CL spoke with patient about [employer]. Patient pcp is at [facility] in [facility]. Cl provided pt with pcp contact information. Patient stated that he had a f/u apt with pcp next week.

## 2014-01-28 NOTE — Progress Notes (Signed)
[place] INPATIENT:  Family/Significant Other Suicide Prevention Education  Suicide Prevention Education:  Contact Attempts: [name] [phone],  has been identified by the patient as the family member/significant other with whom the patient will be residing, and identified as the person(s) who will aid the patient in the event of a mental health crisis.  With written consent from the patient, two attempts were made to provide suicide prevention education, prior to and/or following the patient's discharge.  We were unsuccessful in providing suicide prevention education.  A suicide education pamphlet was given to the patient to share with family/significant other.  Date and time of first attempt:01/28/14 2107 Date and time of second attempt:01/28/14 2110  [name] 01/28/2014, 9:07 PM
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
## 2014-01-28 NOTE — ED Provider Notes (Signed)
Asked to evaluate the patient for chest pain. Patient developed sharp intermittent chest pain after being told that he was accepted for psychiatric admission. Patient does report that he has had similar pain in the past. He is not short of breath. Pain occurred at rest and lasts briefly then reoccurs.  Physical Exam  Constitutional: He is oriented to person, place, and time. He appears well-developed and well-nourished.  HENT:  Head: Normocephalic.  Eyes: Pupils are equal, round, and reactive to light.  Neck: Normal range of motion. Neck supple.  Cardiovascular: Normal rate, regular rhythm, normal heart sounds and intact distal pulses.   Pulmonary/Chest: Effort normal and breath sounds normal. No respiratory distress. He exhibits tenderness.  Abdominal: Soft.  Musculoskeletal: Normal range of motion. He exhibits no edema.  Neurological: He is alert and oriented to person, place, and time. He displays normal reflexes. No cranial nerve deficit. He exhibits normal muscle tone. Coordination normal.  Skin: Skin is warm and dry. No rash noted.  Psychiatric: He has a normal mood and affect. His behavior is normal. Thought content normal.      EKG Interpretation  Date/Time:  Monday January 28 2014 17:38:28 EDT Ventricular Rate:  100 PR Interval:  128 QRS Duration: 86 QT Interval:  372 QTC Calculation: 479 R Axis:   66 Text Interpretation:  Normal sinus rhythm Prolonged QT Abnormal ECG No significant change since last tracing Confirmed by POLLINA  MD, CHRISTOPHER [phone]) on 01/28/2014 5:51:31 PM      Patient complaining of chest pain which is very atypical. Pain code arrest, sharp in nature, comes and goes. Has tenderness over the anterior chest wall. EKG performed, no acute pathology. Troponin was ordered and is negative. This does have a history of alcohol abuse. Has suspected gastritis. GI cocktail administered, the patient vomited it back up. The patient therefore given Reglan and Benadryl with  resolution of his nausea, vomiting and pain. Patient appropriate for psychiatric treatment. No further medical workup necessary.  Orpah Greek, MD 01/28/14 1911

## 2014-01-28 NOTE — ED Notes (Signed)
Patient c/o of sudden sharp chest pains, MD notified.

## 2014-01-28 NOTE — ED Notes (Signed)
[place] OFFICER HERE TO SPEAK WITH PT

## 2014-01-28 NOTE — Consult Note (Signed)
  Patient states "I am withdrawing from alcohol and I need some help.  Patient denies suicidal/homicidal ideation, psychosis, and paranoia.     Agree with TTS Recommend observation.  Patient accepted to [facility] Observation.  Monitor for safety and stabilization until transfer.  Shuvon B. Rankin FNP-BC  I have personally seen the patient and agreed with the findings and involved in the treatment plan. Berniece Andreas, MD

## 2014-01-28 NOTE — ED Notes (Signed)
TTS in progress 

## 2014-01-28 NOTE — BH Assessment (Signed)
Consulted with Earleen Newport, NP who is recommending that patient be admitted to [facility] 23 hour observation unit. Per [facility] Gerlene Fee pt is assigned to observation bed #2. Attending Physician is Dr.Kumar. Pt signed voluntary consent form that was faxed to [facility].  Shaune Pollack, MS, [place] Assessment Counselor

## 2014-01-28 NOTE — ED Notes (Signed)
MD in room, EKG performed.
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
## 2014-01-28 NOTE — ED Notes (Signed)
Called to give report to nurse taking pt at [facility]. Stated they have gotten report from day shift. Stated ok to send pt. Asked if any other forms needed other than emtala stated no.

## 2014-01-28 NOTE — ED Notes (Signed)
TTS complete, pt requesting pain medication rates pain 98/10 in rectum

## 2014-01-28 NOTE — Plan of Care (Signed)
[facility] Observation Crisis Plan  Reason for Crisis Plan:  Crisis Stabilization   Plan of Care:  Referral for Substance Abuse  Family Support:    [name] Mother [phone]  Current Living Environment:  Living Arrangements: Other relatives  Insurance:   Hospital Account   Name Acct ID Class Status Primary Coverage   Abdul, Beirne [id number] [place] MH/DD/SAS - [employer]        Guarantor Account (for Hospital Account [id number])   Name Relation to Pt Service Area Active? Acct Type   [name] Self [facility] Yes Behavioral Health   Address Phone       [place] [place], [place] [address] [phone])          Coverage Information (for Hospital Account [id number])   F/O Payor/Plan Precert #   [place] MH/DD/SAS/[employer] [place] #   [place], [place] [id number]   Address Phone   [address], [place] [address] [phone]      Legal Guardian:     Primary Care Provider:  Elbert Ewings, FNP  Current Outpatient Providers:  none  Psychiatrist:   none  Counselor/Therapist:   none  Compliant with Medications:  No  Additional Information:   Apolinar Junes 7/13/20158:59 PM

## 2014-01-29 DIAGNOSIS — R079 Chest pain, unspecified: Secondary | ICD-10-CM | POA: Diagnosis present

## 2014-01-29 DIAGNOSIS — Z598 Other problems related to housing and economic circumstances: Secondary | ICD-10-CM | POA: Diagnosis not present

## 2014-01-29 DIAGNOSIS — F4310 Post-traumatic stress disorder, unspecified: Secondary | ICD-10-CM | POA: Diagnosis present

## 2014-01-29 DIAGNOSIS — K7460 Unspecified cirrhosis of liver: Secondary | ICD-10-CM | POA: Diagnosis present

## 2014-01-29 DIAGNOSIS — I739 Peripheral vascular disease, unspecified: Secondary | ICD-10-CM | POA: Diagnosis present

## 2014-01-29 DIAGNOSIS — F209 Schizophrenia, unspecified: Secondary | ICD-10-CM | POA: Diagnosis present

## 2014-01-29 DIAGNOSIS — G4700 Insomnia, unspecified: Secondary | ICD-10-CM | POA: Diagnosis present

## 2014-01-29 DIAGNOSIS — E7800 Pure hypercholesterolemia, unspecified: Secondary | ICD-10-CM | POA: Diagnosis present

## 2014-01-29 DIAGNOSIS — R45851 Suicidal ideations: Secondary | ICD-10-CM | POA: Diagnosis not present

## 2014-01-29 DIAGNOSIS — F410 Panic disorder [episodic paroxysmal anxiety] without agoraphobia: Secondary | ICD-10-CM | POA: Diagnosis present

## 2014-01-29 DIAGNOSIS — Z5989 Other problems related to housing and economic circumstances: Secondary | ICD-10-CM | POA: Diagnosis not present

## 2014-01-29 DIAGNOSIS — F319 Bipolar disorder, unspecified: Secondary | ICD-10-CM | POA: Diagnosis present

## 2014-01-29 DIAGNOSIS — F17200 Nicotine dependence, unspecified, uncomplicated: Secondary | ICD-10-CM | POA: Diagnosis present

## 2014-01-29 DIAGNOSIS — F411 Generalized anxiety disorder: Secondary | ICD-10-CM | POA: Diagnosis present

## 2014-01-29 DIAGNOSIS — F332 Major depressive disorder, recurrent severe without psychotic features: Secondary | ICD-10-CM | POA: Diagnosis present

## 2014-01-29 DIAGNOSIS — I2510 Atherosclerotic heart disease of native coronary artery without angina pectoris: Secondary | ICD-10-CM | POA: Diagnosis present

## 2014-01-29 DIAGNOSIS — R109 Unspecified abdominal pain: Secondary | ICD-10-CM | POA: Diagnosis present

## 2014-01-29 DIAGNOSIS — F1994 Other psychoactive substance use, unspecified with psychoactive substance-induced mood disorder: Secondary | ICD-10-CM | POA: Diagnosis present

## 2014-01-29 DIAGNOSIS — I10 Essential (primary) hypertension: Secondary | ICD-10-CM | POA: Diagnosis present

## 2014-01-29 DIAGNOSIS — Z823 Family history of stroke: Secondary | ICD-10-CM | POA: Diagnosis not present

## 2014-01-29 DIAGNOSIS — F1020 Alcohol dependence, uncomplicated: Secondary | ICD-10-CM | POA: Diagnosis present

## 2014-01-29 DIAGNOSIS — Z5987 Material hardship: Secondary | ICD-10-CM | POA: Diagnosis not present

## 2014-01-29 DIAGNOSIS — F10988 Alcohol use, unspecified with other alcohol-induced disorder: Secondary | ICD-10-CM

## 2014-01-29 MED ORDER — OLANZAPINE 10 MG PO TBDP
5.0000 mg | ORAL_TABLET | Freq: Three times a day (TID) | ORAL | Status: DC | PRN
  Administered 2014-01-29 – 2014-02-02 (×5): 5 mg via ORAL
  Filled 2014-01-29 (×4): qty 1

## 2014-01-29 MED ORDER — DICYCLOMINE HCL 20 MG PO TABS
20.0000 mg | ORAL_TABLET | Freq: Four times a day (QID) | ORAL | Status: DC | PRN
  Administered 2014-01-29 – 2014-02-02 (×4): 20 mg via ORAL
  Filled 2014-01-29 (×4): qty 1

## 2014-01-29 MED ORDER — DIPHENHYDRAMINE HCL 25 MG PO CAPS
25.0000 mg | ORAL_CAPSULE | ORAL | Status: DC | PRN
  Administered 2014-01-29: 25 mg via ORAL

## 2014-01-29 MED ORDER — FAMOTIDINE 40 MG PO TABS
40.0000 mg | ORAL_TABLET | Freq: Once | ORAL | Status: AC
  Administered 2014-01-29: 40 mg via ORAL
  Filled 2014-01-29: qty 1

## 2014-01-29 MED ORDER — PROMETHAZINE HCL 25 MG/ML IJ SOLN
25.0000 mg | Freq: Once | INTRAMUSCULAR | Status: AC
  Administered 2014-01-29: 25 mg via INTRAMUSCULAR
  Filled 2014-01-29: qty 1

## 2014-01-29 NOTE — Progress Notes (Signed)
D:  Pt +ve AVH- ETOH related. Pt denies SI/HI. Pt is pleasant and cooperative. Pt only complaint is withdrawal symptoms.   A: Pt was offered support and encouragement. Pt was given scheduled medications. Pt was encourage to attend groups. Q 15 minute checks were done for safety.  R:Pt attends groups and interacts well with peers and staff. Pt is taking medication.Pt receptive to treatment and safety maintained on unit.

## 2014-01-29 NOTE — Progress Notes (Signed)
Adult Psychoeducational Group Note  Date:  01/29/2014 Time:  9:41 PM  Group Topic/Focus:  Wrap-Up Group:   The focus of this group is to help patients review their daily goal of treatment and discuss progress on daily workbooks.  Participation Level:  Active  Participation Quality:  Appropriate and Attentive  Affect:  Appropriate  Cognitive:  Alert and Appropriate  Insight: Good  Engagement in Group:  Engaged and Improving  Modes of Intervention:  Activity  Additional Comments:  Pt was engaged in group. Pt is scared that he will not be able to eat or drink anything at this time   Agustin Swatek R 01/29/2014, 9:41 PM
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
## 2014-01-29 NOTE — Progress Notes (Signed)
Patient ID: [name], male   DOB: 09/19/1980, 33 y.o.   MRN: [id number] Patient complaining of nausea and vomiting repeatedly.  Given Zofran prior shift which patient vomited up.  Placed call to Heloise Purpura, NP.  Phenergan IM given.  Patients nausea was relieved and patient was able to sleep.  Awoke itching all over and was given Benadryl and Pepcid.  Patient stopped itching and then went back to sleep. Patients am meds held due to nausea. Will continue to monitor for safety.

## 2014-01-29 NOTE — Tx Team (Signed)
Initial Interdisciplinary Treatment Plan  PATIENT STRENGTHS: (choose at least two) Ability for insight Average or above average intelligence Capable of independent living General fund of knowledge  PATIENT STRESSORS: Medication change or noncompliance Substance abuse Traumatic event   PROBLEM LIST: Problem List/Patient Goals Date to be addressed Date deferred Reason deferred Estimated date of resolution  Substance Abuse 01/29/14                                                      DISCHARGE CRITERIA:  Ability to meet basic life and health needs Improved stabilization in mood, thinking, and/or behavior Verbal commitment to aftercare and medication compliance Withdrawal symptoms are absent or subacute and managed without 24-hour nursing intervention  PRELIMINARY DISCHARGE PLAN: Attend aftercare/continuing care group Return to previous living arrangement  PATIENT/FAMIILY INVOLVEMENT: This treatment plan has been presented to and reviewed with the patient, [name], and/or family member, .  The patient and family have been given the opportunity to ask questions and make suggestions.  [place], [place] 01/29/2014, 4:16 PM

## 2014-01-29 NOTE — Progress Notes (Signed)
Patient ID: [name], male   DOB: 1981-01-14, 33 y.o.   MRN: [id number] Patient transferred to Adult Unit. Escorted to unit by [employer],
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
## 2014-01-29 NOTE — Plan of Care (Signed)
[facility] Observation Crisis Plan  Reason for Crisis Plan:  Crisis Stabilization   Plan of Care:  Referral for Inpatient Hospitalization  Family Support:    Mother, Father, Brothers  Current Living Environment:  Living Arrangements: Other relatives; pt reports that he now lives alone.  Insurance:  [facility] Account   Name Acct ID Class Status Primary Coverage   [place], [place] [id number] [place] [facility]/DD/SAS - [employer]        Guarantor Account (for Hospital Account [id number])   Name Relation to Pt Service Area Active? Acct Type   [name] Self [facility] Yes Behavioral Health   Address Phone       [place], [place] [address] [phone]          Coverage Information (for Hospital Account [id number])   F/O Payor/Plan [place] #   [place] [facility]/DD/SAS/[employer] [place] #   [place], [place] [id number]   Address Phone   [address] [place], [place] [address] [phone]      Legal Guardian:   Self  Primary Care Provider:  Elbert Ewings, FNP  Current Outpatient Providers:  None currently  Psychiatrist:    None currently  Counselor/Therapist:    None currently  Compliant with Medications:  No  Additional Information: After consulting with [name] Abbot, MD and [name] Pizza, NP it has been determined that pt would benefit from psychiatric hospitalization.  Pt accepted to [facility] to the service of [name] Garnet, MD, Rm [id number].  Pt signed Voluntary Admission and Consent for Treatment.  He also signed Consent to Release Information to [name], [place] (his PCP), along with his attorney.   Abbe [place] 7/14/20156:33 PM

## 2014-01-30 DIAGNOSIS — F1994 Other psychoactive substance use, unspecified with psychoactive substance-induced mood disorder: Secondary | ICD-10-CM

## 2014-01-30 DIAGNOSIS — F1910 Other psychoactive substance abuse, uncomplicated: Secondary | ICD-10-CM

## 2014-01-30 MED ORDER — ARIPIPRAZOLE 10 MG PO TABS
10.0000 mg | ORAL_TABLET | Freq: Every day | ORAL | Status: DC
  Administered 2014-01-30 – 2014-02-05 (×7): 10 mg via ORAL
  Filled 2014-01-30 (×2): qty 1
  Filled 2014-01-30: qty 14
  Filled 2014-01-30 (×7): qty 1

## 2014-01-30 MED ORDER — LORAZEPAM 1 MG PO TABS
1.0000 mg | ORAL_TABLET | Freq: Every day | ORAL | Status: AC
  Administered 2014-02-02: 1 mg via ORAL
  Filled 2014-01-30 (×2): qty 1

## 2014-01-30 MED ORDER — METOPROLOL TARTRATE 25 MG PO TABS
ORAL_TABLET | ORAL | Status: AC
  Filled 2014-01-30: qty 1

## 2014-01-30 MED ORDER — LORAZEPAM 1 MG PO TABS
1.0000 mg | ORAL_TABLET | Freq: Two times a day (BID) | ORAL | Status: AC
  Administered 2014-02-01 (×2): 1 mg via ORAL
  Filled 2014-01-30 (×2): qty 1

## 2014-01-30 MED ORDER — LORAZEPAM 1 MG PO TABS
1.0000 mg | ORAL_TABLET | Freq: Four times a day (QID) | ORAL | Status: AC
  Administered 2014-01-30 (×2): 1 mg via ORAL
  Filled 2014-01-30: qty 1

## 2014-01-30 MED ORDER — NAPROXEN 500 MG PO TABS
500.0000 mg | ORAL_TABLET | Freq: Two times a day (BID) | ORAL | Status: DC | PRN
  Administered 2014-01-30 – 2014-02-05 (×8): 500 mg via ORAL
  Filled 2014-01-30 (×8): qty 1

## 2014-01-30 MED ORDER — OLANZAPINE 5 MG PO TABS
5.0000 mg | ORAL_TABLET | Freq: Once | ORAL | Status: AC
  Administered 2014-01-30: 5 mg via ORAL
  Filled 2014-01-30: qty 1
  Filled 2014-01-30: qty 2

## 2014-01-30 MED ORDER — CLONIDINE HCL 0.1 MG PO TABS
0.1000 mg | ORAL_TABLET | Freq: Once | ORAL | Status: DC
Start: 2014-01-30 — End: 2014-01-30

## 2014-01-30 MED ORDER — HYDRALAZINE HCL 10 MG PO TABS
10.0000 mg | ORAL_TABLET | Freq: Three times a day (TID) | ORAL | Status: DC
  Administered 2014-01-30 (×3): 10 mg via ORAL
  Filled 2014-01-30 (×9): qty 1

## 2014-01-30 MED ORDER — METOPROLOL TARTRATE 50 MG PO TABS
50.0000 mg | ORAL_TABLET | Freq: Two times a day (BID) | ORAL | Status: DC
Start: 2014-01-31 — End: 2014-02-06
  Administered 2014-01-31 – 2014-02-06 (×13): 50 mg via ORAL
  Filled 2014-01-30 (×18): qty 1

## 2014-01-30 MED ORDER — LORAZEPAM 1 MG PO TABS
1.0000 mg | ORAL_TABLET | Freq: Three times a day (TID) | ORAL | Status: AC
  Administered 2014-01-31 (×3): 1 mg via ORAL
  Filled 2014-01-30: qty 1

## 2014-01-30 MED ORDER — METOPROLOL TARTRATE 25 MG PO TABS
25.0000 mg | ORAL_TABLET | Freq: Once | ORAL | Status: AC
  Administered 2014-01-30: 25 mg via ORAL
  Filled 2014-01-30: qty 1

## 2014-01-30 MED ORDER — SUMATRIPTAN SUCCINATE 25 MG PO TABS
25.0000 mg | ORAL_TABLET | ORAL | Status: DC | PRN
  Administered 2014-01-30 – 2014-02-03 (×4): 25 mg via ORAL
  Filled 2014-01-30 (×5): qty 1

## 2014-01-30 NOTE — Progress Notes (Addendum)
Patient ID: [name], male   DOB: 23-Mar-1981, 33 y.o.   MRN: [id number]  D: Pt. Denies SI/HI but reports positive auditory and visual hallucinations. Patient is delusional and reports that he is seeing his mother, brother, and cousin and talks to them when no one is there. Patient appeared agitated this morning and received Zyprexa. NP and MD were made aware of the patient's condition. Patient rates his depression and hopelessness at 0/10 for the day. Patient reports pain in head and received PRN medication for this.   A: Support and encouragement provided to the patient. Scheduled medications are administered to patient per physician's orders.   R: Patient is receptive and cooperative but continues to be confused. Patient is disoriented at times and reports seeing spray paint, feces, and dolls with candles on top of their head. Q15 minute checks are maintained for safety.  Patient was placed on a 1:1 for safety.

## 2014-01-30 NOTE — Discharge Summary (Signed)
Patient seen, evaluated by me. Treatment plan from a good by me. Patient states that he is having visual hallucinations, seems overwhelmed, reports suicidal thoughts and needs inpatient psychiatric admission
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
## 2014-01-30 NOTE — Progress Notes (Signed)
The focus of this group is to help patients review their daily goal of treatment and discuss progress on daily workbooks. Pt did not attend the evening group. Upon being invited by the Writer, Pt responded "I'm not [name]."

## 2014-01-30 NOTE — BHH Group Notes (Signed)
Adult Psychoeducational Group Note  Date:  01/30/2014 Time:  11:40 AM  Group Topic/Focus:  Developing a Wellness Toolbox:   The focus of this group is to help patients develop a "wellness toolbox" with skills and strategies to promote recovery upon discharge.  Participation Level:  Active  Participation Quality:  Appropriate  Affect:  Appropriate  Cognitive:  Alert  Insight: Appropriate  Engagement in Group:  Engaged  Modes of Intervention:  Activity and Discussion  Additional Comments: He was quite most of the time however, he shared how his brother turned his life around and became a Theme park manager. So he can turn his life around.   Hettie Holstein D 01/30/2014, 11:40 AM

## 2014-01-30 NOTE — Progress Notes (Addendum)
Patient ID: [name], male   DOB: October 10, 1980, 33 y.o.   MRN: [id number] Patient transferred from 500 hall to 400 hall.  He is a DNA due to psychosis/visual hallucinations.  Patient is talking to self and pacing the hallway.  He presents with tremulous hands, sweating and high pulse.  Ativan 1 mg given for CIWA of 10.  Patient denies any SI/HI.  He is cooperative and pleasant.  1:1 sitter with patient for redirection.  Patient has some confusion present.  Patient was in OBS unit yesterday and today is a noticeable change from yesterday.  Continue to assess.

## 2014-01-30 NOTE — [facility] Group Notes (Signed)
[facility] LCSW Aftercare Discharge Planning Group Note   01/30/2014  8:45 AM  Participation Quality:  Did Not Attend - pt sleeping in his room  Regan Lemming, LCSW 01/30/2014 10:16 AM

## 2014-01-30 NOTE — Progress Notes (Addendum)
D: Pt remains disorganized and positive for AVH.  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 Pt is positive for suicidal ideation. Pt verbalizes that he is preparing for " the funeral". He had previously mentioned that his brother passed away last night to another staff member. Writer is unsure of the validity of this information voiced by the pt. Pt appears sweaty and anxious with mild tremors. Pt reported that he has been having diarrhea. Pt instructed to contact nurse on the onset of any diarrhea so I can administer "imodium" as appropriate for his complaint.  A: Writer administered scheduled and prn medications to pt. Continued support and availability as needed was extended to this pt. Staff continue to monitor pt with q78min checks.  R: No adverse drug reactions noted. Pt receptive to treatment. Pt remains safe at this time.

## 2014-01-30 NOTE — Progress Notes (Signed)
Patient ID: [name], male   DOB: 23-Nov-1980, 33 y.o.   MRN: [id number]  1:1 Nursing note 1200-  Patient is seen in his room removing sheets from his bed. Patient then moved out into the hallway and said, "Mom! Come get your clothes!" Followed by a short pause and then patient stated, "okay." Patient is hyperactive at this time. [name] respirations are even and unlabored. MHT is within reach. 1:1 order will continue. Q15 minute safety checks are maintained as well.

## 2014-01-30 NOTE — Progress Notes (Signed)
Pt appears to be experiencing psychosis. Pt has been pacing his room and working himself up.

## 2014-01-30 NOTE — Progress Notes (Signed)
1:1 observation note:  Patient continues to have visual and auditory hallucinations. He also has disorganized thought processes.  Patient states the voices tell him, "you're worthless.  You need to kill yourself."  Patient is also delusional stating, "my brother died last night in prison."  Patient became tearful and asked for anxiety medication.  He report visual hallucinations of seeing "my family."  Patient mumbles to himself periodically.  He is appropriate with staff. 1:1 continued for redirection.

## 2014-01-30 NOTE — [facility] Group Notes (Signed)
[facility] [employer] Group Therapy  01/30/2014 , 1:29 PM    Type of Therapy:  Mental [employer] Presentation  Participation Level:  Active  Participation Quality:  Attentive  Affect:  Blunted  Cognitive:  Oriented  Insight:  Limited  Engagement in Therapy:  Engaged  Modes of Intervention:  Discussion, Education and Socialization  Summary of Progress/Problems:  [name] from [name] came to present his recovery story and play the guitar.  Did not attend  [name] 01/30/2014 , 1:29 PM

## 2014-01-30 NOTE — Progress Notes (Signed)
RN 1:1 Note   Pt awake in bed with sitter at the bedside. Pt is initiating a conversation with another staff member who has entered the room. Pt appears in be in no signs of distress at this time.  A: 1:1 continuous observation remains for pt. R: Pt remains safe at this time.

## 2014-01-30 NOTE — [facility] Suicide Risk Assessment (Signed)
   Nursing information obtained from:  Patient Demographic factors:  Male Current Mental Status:  NA Loss Factors:  NA Historical Factors:  Prior suicide attempts;Family history of mental illness or substance abuse Risk Reduction Factors:  Positive social support Total Time spent with patient: 45 minutes  CLINICAL FACTORS:   Alcohol/Substance Abuse/Dependencies Currently Psychotic  Psychiatric Specialty Exam: Physical Exam  ROS  Blood pressure 150/95, pulse 55, temperature 97.8 F (36.6 C), temperature source Oral, resp. rate 18, height 5\' 6"  (1.676 m), weight 76.204 kg (168 lb), SpO2 97.00%.Body mass index is 27.13 kg/(m^2).  General Appearance: Well Groomed  Engineer, water::  Good  Speech:  soft, at times difficult to make out what he is saying   Volume:  Decreased  Mood:  labile - denies depression curently  Affect:  Labile  Thought Process:  Disorganized  Orientation:  Other:  oriented x 3. At this time no evidence of delirium  Thought Content:  Hallucinations: Auditory- reports auditory hallucinations which he describes as " conversations". At this time denies visual hallucinations , but does appear to be internally preoccupied and looks to the side at times as if there was something there.   Suicidal Thoughts:  No- currently denies any suicidal ideations or any thoughts of hurting anyone   Homicidal Thoughts:  No  Memory:  NA  Judgement:  Fair  Insight:  Lacking  Psychomotor Activity:  slightly restless, standing up/sitting during session  Concentration:  Fair  Recall:  [employer] of Knowledge:Fair  Language: Fair  Akathisia:  No  Handed:  Right  AIMS (if indicated):     Assets:  Communication Skills Desire for Improvement  Sleep:  Number of Hours: 0   Musculoskeletal: Strength & Muscle Tone: within normal limits- mild distal tremors, no diaphoresis noted, not acutely restless or agitated Gait & Station: normal Patient leans: N/A  COGNITIVE FEATURES THAT CONTRIBUTE  TO RISK:  Disorganized thought process and limited insight currently  SUICIDE RISK:   Moderate:  Frequent suicidal ideation with limited intensity, and duration, some specificity in terms of plans, no associated intent, good self-control, limited dysphoria/symptomatology, some risk factors present, and identifiable protective factors, including available and accessible social support.  PLAN OF CARE:Patient will be admitted to inpatient psychiatric unit for stabilization and safety. Will provide and encourage milieu participation. Provide medication management and maked adjustments as needed.   Provide Detoxification management to minimize risk of ETOH WDL. Will follow daily.    I certify that inpatient services furnished can reasonably be expected to improve the patient's condition.  Santanna Whitford 01/30/2014, 11:40 AM

## 2014-01-30 NOTE — H&P (Signed)
Patient admitted to [facility] Observation unit. Patient was started on a detox protocol

## 2014-01-30 NOTE — Progress Notes (Signed)
Pt wandering in his room. Pt responding to internal stimuli. Pt is requesting to leave at this time.   Pt states, " I need to go home". " I only came here to get my medications". " My mom is outside". " I have a funeral to go to".   Pt encouraged to gain some rest at this time. Pt also informed that his mom is not here at the present time. Pt requesting to see the doctor.  Pt informed that the doctor is not here at this time. Pt is requesting that I relay this information to the Doctor. Pt informed that I will take note of his request and report to on-coming staff. Pt accepting of this writer's response.

## 2014-01-30 NOTE — Progress Notes (Signed)
Pt has not been to sleep most of the night. Pt Bp was elevated at 148/110 and pt complained of H/A. Pt was given 10 mg Hydralazine and 500 mg Naprosyn at 234. Pt appears to be going through withdrawals while experiencing hallucinations.
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
## 2014-01-31 ENCOUNTER — Inpatient Hospital Stay (HOSPITAL_COMMUNITY): Payer: No Typology Code available for payment source

## 2014-01-31 ENCOUNTER — Encounter (HOSPITAL_COMMUNITY): Payer: Self-pay | Admitting: Radiology

## 2014-01-31 ENCOUNTER — Encounter (HOSPITAL_COMMUNITY): Payer: Self-pay

## 2014-01-31 ENCOUNTER — Other Ambulatory Visit (HOSPITAL_COMMUNITY): Payer: Self-pay

## 2014-01-31 DIAGNOSIS — F339 Major depressive disorder, recurrent, unspecified: Secondary | ICD-10-CM

## 2014-01-31 DIAGNOSIS — F1010 Alcohol abuse, uncomplicated: Secondary | ICD-10-CM

## 2014-01-31 LAB — I-STAT TROPONIN, ED: Troponin i, poc: 0.04 ng/mL (ref 0.00–0.08)

## 2014-01-31 LAB — CBC WITH DIFFERENTIAL/PLATELET
Basophils Absolute: 0 10*3/uL (ref 0.0–0.1)
Basophils Relative: 1 % (ref 0–1)
EOS ABS: 0.2 10*3/uL (ref 0.0–0.7)
EOS PCT: 4 % (ref 0–5)
HEMATOCRIT: 39 % (ref 39.0–52.0)
HEMOGLOBIN: 13.6 g/dL (ref 13.0–17.0)
LYMPHS ABS: 1.3 10*3/uL (ref 0.7–4.0)
Lymphocytes Relative: 33 % (ref 12–46)
MCH: 30.9 pg (ref 26.0–34.0)
MCHC: 34.9 g/dL (ref 30.0–36.0)
MCV: 88.6 fL (ref 78.0–100.0)
MONOS PCT: 11 % (ref 3–12)
Monocytes Absolute: 0.5 10*3/uL (ref 0.1–1.0)
Neutro Abs: 2.1 10*3/uL (ref 1.7–7.7)
Neutrophils Relative %: 51 % (ref 43–77)
Platelets: 82 10*3/uL — ABNORMAL LOW (ref 150–400)
RBC: 4.4 MIL/uL (ref 4.22–5.81)
RDW: 15.7 % — ABNORMAL HIGH (ref 11.5–15.5)
WBC: 4.1 10*3/uL (ref 4.0–10.5)

## 2014-01-31 LAB — COMPREHENSIVE METABOLIC PANEL
ALK PHOS: 65 U/L (ref 39–117)
ALT: 186 U/L — AB (ref 0–53)
ANION GAP: 16 — AB (ref 5–15)
AST: 206 U/L — ABNORMAL HIGH (ref 0–37)
Albumin: 3.8 g/dL (ref 3.5–5.2)
BUN: 10 mg/dL (ref 6–23)
CALCIUM: 10.2 mg/dL (ref 8.4–10.5)
CO2: 23 meq/L (ref 19–32)
Chloride: 98 mEq/L (ref 96–112)
Creatinine, Ser: 0.9 mg/dL (ref 0.50–1.35)
GFR calc non Af Amer: >90 mL/min (ref >90)
GLUCOSE: 79 mg/dL (ref 70–99)
POTASSIUM: 3.6 meq/L — AB (ref 3.7–5.3)
SODIUM: 137 meq/L (ref 137–147)
Total Bilirubin: 0.7 mg/dL (ref 0.3–1.2)
Total Protein: 7.1 g/dL (ref 6.0–8.3)

## 2014-01-31 LAB — LIPASE, BLOOD: Lipase: 28 U/L (ref 11–59)

## 2014-01-31 MED ORDER — ONDANSETRON HCL 4 MG PO TABS: 4.0000 mg | ORAL_TABLET | Freq: Four times a day (QID) | ORAL | Status: DC

## 2014-01-31 MED ORDER — METOCLOPRAMIDE HCL 5 MG/ML IJ SOLN
10.0000 mg | Freq: Once | INTRAMUSCULAR | Status: AC
  Administered 2014-01-31: 10 mg via INTRAVENOUS
  Filled 2014-01-31: qty 2

## 2014-01-31 MED ORDER — DIPHENHYDRAMINE HCL 50 MG/ML IJ SOLN
25.0000 mg | Freq: Once | INTRAMUSCULAR | Status: AC
Start: 2014-01-31 — End: 2014-01-31
  Administered 2014-01-31: 25 mg via INTRAVENOUS
  Filled 2014-01-31: qty 1

## 2014-01-31 MED ORDER — MECLIZINE HCL 25 MG PO TABS
25.0000 mg | ORAL_TABLET | Freq: Once | ORAL | Status: AC
  Administered 2014-01-31: 25 mg via ORAL
  Filled 2014-01-31: qty 1

## 2014-01-31 MED ORDER — MECLIZINE HCL 25 MG PO TABS: 25.0000 mg | ORAL_TABLET | Freq: Three times a day (TID) | ORAL | Status: DC | PRN

## 2014-01-31 MED ORDER — SODIUM CHLORIDE 0.9 % IV BOLUS (SEPSIS)
1000.0000 mL | Freq: Once | INTRAVENOUS | Status: AC
  Administered 2014-01-31: 1000 mL via INTRAVENOUS

## 2014-01-31 NOTE — ED Notes (Signed)
Pt states having abdominal pain and back of head pain, states started this morning, is at [facility] for detox, denies SI/HI.
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
## 2014-01-31 NOTE — Discharge Instructions (Signed)
Take zofran 4 mg every 6 hrs as needed for nausea.   Take meclizine 25 mg every 8 hrs as needed for dizziness.   Continue metoprolol 50 mg twice a day.   Follow up with your doctor.   Return to ER if you have worse dizziness, chest pain, vomiting.

## 2014-01-31 NOTE — Progress Notes (Signed)
Patient transferred per MD order to [facility] via [employer]. Report called and given to [facility], Therapist, sports.
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
## 2014-01-31 NOTE — [facility] Group Notes (Signed)
[facility] Group Notes:  (Counselor/Nursing/MHT/Case Management/Adjunct)  01/31/2014 1:15PM  Type of Therapy:  Group Therapy  Participation Level: Did not attend  Summary of Progress/Problems: The topic for group was balance in life.  Pt participated in the discussion about when their life was in balance and out of balance and how this feels.  Pt discussed ways to get back in balance and short term goals they can work on to get where they want to be.    [name] 01/31/2014 2:48 PM

## 2014-01-31 NOTE — Progress Notes (Signed)
1:1 Note  Patient back from [facility] with MHT present. Patient denies any detox symptoms or pain at this time and voices no concerns or questions at this time. The patient stated that he just wanted "more ativan" before going to sleep. Patient was administered ativan and escorted to room by MHT. Will continue to monitor patient for safety.

## 2014-01-31 NOTE — Progress Notes (Signed)
Adult Psychoeducational Group Note  Date:  01/31/2014 Time:  8:00PM Group Topic/Focus:  Wrap-Up Group:   The focus of this group is to help patients review their daily goal of treatment and discuss progress on daily workbooks.  Participation Level:  Did Not Attend  Additional Comments: Pt. Didn't attend karaoke .   Theodoro Grist D 01/31/2014, 10:01 PM

## 2014-01-31 NOTE — ED Notes (Signed)
Patient sent back to [facility]

## 2014-01-31 NOTE — Progress Notes (Signed)
1:1 Note  Patient currently in room resting with MHT present. Patient having on and off passive SI but verbally agrees to contract for safety. Patient is positive for auditory hallucinations and states that he "wants to go home." Patient denies any ETOH withdrawal symptoms at this time. Patient remains somewhat disorganized and needs redirection from sitter at times within the milieu. Will continue to monitor patient for safety.

## 2014-01-31 NOTE — ED Notes (Signed)
Bed: WA02 Expected date:  Expected time:  Means of arrival:  Comments: 

## 2014-01-31 NOTE — Progress Notes (Addendum)
1:1 Note  Patient currently in room resting with MHT present. Patient denies any withdrawal symptoms at this time. Patient voices no concerns or questions. Will continue to monitor patient for safety.

## 2014-01-31 NOTE — Progress Notes (Signed)
[facility] MD Progress Note  01/31/2014 2:04 PM [name]  MRN:  [id number] Subjective:   Patient states "I came here for alcohol. I have been drinking for fifteen years. I was drinking four bottles of wine a day. I am very sleepy now. I don't want to tell you why I drink so much. That is personal."   Objective:  Patient is seen and chart is reviewed. The patient is reported to be having multiple withdrawal symptoms from alcohol including psychosis. He has been experiencing visual and auditory hallucinations. Patient has also expressed delusions about his brother being killed last night. Patient has been observed talking to himself at times. He was sent to the [facility] this morning with complaint of chest pain, headache, dizziness, hallucinations and abdominal pain. Patient had been to the ED two days ago after an overdose on Trazodone. His significantly elevated AST/ALT are thought to be related to his excessive alcohol use. Patient was sent back to [facility] after receiving a bolus of sodium chloride. He was ordered some prn medications to help with his physical complaints. During assessment today the patient appears very sleepy likely from receiving Ativan. He is resting comfortably with 1:1 staff present at bedside.   Diagnosis:   DSM5: Total Time spent with patient: 20 minutes  Axis I: Major depressive disorder, recurrent episode           Alcohol abuse with alcohol-induced mood disorder  Axis II: Deferred Axis III:  Past Medical History  Diagnosis Date  . Seizures   . Hypertension   . Depression   . Pancreatitis   . Liver cirrhosis   . Coronary artery disease   . Angina   . Shortness of breath   . Headache(784.0)   . Peripheral vascular disease April 2011    Left Pop  . Hypercholesteremia   . Schizophrenia   . Bipolar 1 disorder   . Cancer of kidney dx'd 04/2013    lt nephrectomy  . Breast CA dx'd 2009    bil w/ bil masectomy and oral meds   Axis IV: economic problems, occupational  problems, other psychosocial or environmental problems and problems related to social environment Axis V: 21-30 behavior considerably influenced by delusions or hallucinations OR serious impairment in judgment, communication OR inability to function in almost all areas  ADL's:  Impaired  Sleep: Poor  Appetite:  Poor  Suicidal Ideation:  Denies Homicidal Ideation:  Denies AEB (as evidenced by):  Psychiatric Specialty Exam: Physical Exam  Review of Systems  Constitutional: Positive for diaphoresis. Negative for fever, chills, weight loss and malaise/fatigue.  HENT: Negative for ear discharge, ear pain, hearing loss, nosebleeds and tinnitus.   Eyes: Negative for blurred vision, double vision, photophobia, pain and discharge.  Respiratory: Negative for cough, hemoptysis, sputum production and shortness of breath.   Cardiovascular: Negative for chest pain, palpitations, orthopnea, claudication and leg swelling.  Gastrointestinal: Positive for nausea. Negative for heartburn, vomiting, abdominal pain, diarrhea, constipation and blood in stool.  Genitourinary: Negative for dysuria, urgency, frequency, hematuria and flank pain.  Musculoskeletal: Negative for back pain, joint pain, myalgias and neck pain.  Skin: Negative for itching and rash.  Neurological: Positive for dizziness, tremors and headaches. Negative for sensory change, speech change, focal weakness, seizures and loss of consciousness.  Endo/Heme/Allergies: Negative for environmental allergies and polydipsia. Does not bruise/bleed easily.  Psychiatric/Behavioral: Positive for depression, hallucinations and substance abuse. The patient is nervous/anxious and has insomnia.     Blood pressure 130/79, pulse 81, temperature  98.4 F (36.9 C), temperature source Oral, resp. rate 15, height '5\' 6"'  (1.676 m), weight 76.204 kg (168 lb), SpO2 98.00%.Body mass index is 27.13 kg/(m^2).  General Appearance: Disheveled  Eye Contact::  Minimal   Speech:  Garbled and Slow  Volume:  Decreased  Mood:  Dysphoric  Affect:  Restricted  Thought Process:  Coherent  Orientation:  Full (Time, Place, and Person)  Thought Content:  Hallucinations: Visual  Suicidal Thoughts:  No  Homicidal Thoughts:  No  Memory:  Immediate;   Fair Recent;   Fair Remote;   Fair  Judgement:  Fair  Insight:  Shallow  Psychomotor Activity:  Psychomotor Retardation  Concentration:  Fair  Recall:  Poor  Fund of Knowledge:Poor  Language: Fair  Akathisia:  No  Handed:  Right  AIMS (if indicated):     Assets:  Desire for Improvement Leisure Time Resilience  Sleep:  Number of Hours: 2   Musculoskeletal: Strength & Muscle Tone: within normal limits Gait & Station: normal Patient leans: N/A  Current Medications: Current Facility-Administered Medications  Medication Dose Route Frequency Provider Last Rate Last Dose  . alum & mag hydroxide-simeth (MAALOX/MYLANTA) 200-200-20 MG/5ML suspension 30 mL  30 mL Oral Q4H PRN Laverle Hobby, PA-C      . amantadine (SYMMETREL) capsule 100 mg  100 mg Oral TID Laverle Hobby, PA-C   100 mg at 01/31/14 1139  . amLODipine (NORVASC) tablet 5 mg  5 mg Oral Daily Laverle Hobby, PA-C   5 mg at 01/31/14 0744  . ARIPiprazole (ABILIFY) tablet 10 mg  10 mg Oral QHS Neita Garnet, MD   10 mg at 01/30/14 2124  . atorvastatin (LIPITOR) tablet 20 mg  20 mg Oral q1800 Keera Altidor   20 mg at 01/30/14 1708  . dicyclomine (BENTYL) tablet 20 mg  20 mg Oral Q6H PRN Neita Garnet, MD   20 mg at 01/30/14 0835  . diphenhydrAMINE (BENADRYL) tablet 25 mg  25 mg Oral Q6H PRN Laverle Hobby, PA-C   25 mg at 01/29/14 1043  . DULoxetine (CYMBALTA) DR capsule 30 mg  30 mg Oral Daily Laverle Hobby, PA-C   30 mg at 01/31/14 0743  . hydrOXYzine (ATARAX/VISTARIL) tablet 25 mg  25 mg Oral Q6H PRN Laverle Hobby, PA-C   25 mg at 01/31/14 0144  . loperamide (IMODIUM) capsule 2-4 mg  2-4 mg Oral PRN Laverle Hobby, PA-C      . LORazepam  (ATIVAN) tablet 1 mg  1 mg Oral Q6H PRN Laverle Hobby, PA-C   1 mg at 01/30/14 1430  . LORazepam (ATIVAN) tablet 1 mg  1 mg Oral TID Neita Garnet, MD   1 mg at 01/31/14 1141   Followed by  . [START ON 02/01/2014] LORazepam (ATIVAN) tablet 1 mg  1 mg Oral BID Neita Garnet, MD       Followed by  . [START ON 02/02/2014] LORazepam (ATIVAN) tablet 1 mg  1 mg Oral Daily Neita Garnet, MD      . magnesium hydroxide (MILK OF MAGNESIA) suspension 30 mL  30 mL Oral Daily PRN Laverle Hobby, PA-C      . metoprolol (LOPRESSOR) tablet 50 mg  50 mg Oral BID Laverle Hobby, PA-C   50 mg at 01/31/14 0744  . mirtazapine (REMERON SOL-TAB) disintegrating tablet 15 mg  15 mg Oral QHS Laverle Hobby, PA-C   15 mg at 01/30/14 2125  . multivitamin with minerals tablet 1  tablet  1 tablet Oral Daily Laverle Hobby, PA-C   1 tablet at 01/31/14 9574  . naproxen (NAPROSYN) tablet 500 mg  500 mg Oral BID PRN Laverle Hobby, PA-C   500 mg at 01/30/14 0234  . OLANZapine zydis (ZYPREXA) disintegrating tablet 5 mg  5 mg Oral Q8H PRN Benjamine Mola, FNP   5 mg at 01/31/14 0144  . ondansetron (ZOFRAN-ODT) disintegrating tablet 4 mg  4 mg Oral Q6H PRN Laverle Hobby, PA-C   4 mg at 01/30/14 0835  . SUMAtriptan (IMITREX) tablet 25 mg  25 mg Oral Q2H PRN Benjamine Mola, FNP   25 mg at 01/30/14 2145  . thiamine (B-1) injection 100 mg  100 mg Intramuscular Once [employer], PA-C      . thiamine (VITAMIN B-1) tablet 100 mg  100 mg Oral Daily Laverle Hobby, PA-C   100 mg at 01/31/14 0743  . traZODone (DESYREL) tablet 100 mg  100 mg Oral QHS Laverle Hobby, PA-C   100 mg at 01/30/14 2124    Lab Results:  Results for orders placed during the hospital encounter of 01/28/14 (from the past 48 hour(s))  CBC WITH DIFFERENTIAL     Status: Abnormal   Collection Time    01/31/14 10:15 AM      Result Value Ref Range   WBC 4.1  4.0 - 10.5 K/uL   RBC 4.40  4.22 - 5.81 MIL/uL   Hemoglobin 13.6  13.0 - 17.0 g/dL   HCT 39.0   39.0 - 52.0 %   MCV 88.6  78.0 - 100.0 fL   MCH 30.9  26.0 - 34.0 pg   MCHC 34.9  30.0 - 36.0 g/dL   RDW 15.7 (*) 11.5 - 15.5 %   Platelets 82 (*) 150 - 400 K/uL   Comment: SPECIMEN CHECKED FOR CLOTS     PLATELET COUNT CONFIRMED BY SMEAR   Neutrophils Relative % 51  43 - 77 %   Neutro Abs 2.1  1.7 - 7.7 K/uL   Lymphocytes Relative 33  12 - 46 %   Lymphs Abs 1.3  0.7 - 4.0 K/uL   Monocytes Relative 11  3 - 12 %   Monocytes Absolute 0.5  0.1 - 1.0 K/uL   Eosinophils Relative 4  0 - 5 %   Eosinophils Absolute 0.2  0.0 - 0.7 K/uL   Basophils Relative 1  0 - 1 %   Basophils Absolute 0.0  0.0 - 0.1 K/uL  COMPREHENSIVE METABOLIC PANEL     Status: Abnormal   Collection Time    01/31/14 10:15 AM      Result Value Ref Range   Sodium 137  137 - 147 mEq/L   Potassium 3.6 (*) 3.7 - 5.3 mEq/L   Chloride 98  96 - 112 mEq/L   CO2 23  19 - 32 mEq/L   Glucose, Bld 79  70 - 99 mg/dL   BUN 10  6 - 23 mg/dL   Creatinine, Ser 0.90  0.50 - 1.35 mg/dL   Calcium 10.2  8.4 - 10.5 mg/dL   Total Protein 7.1  6.0 - 8.3 g/dL   Albumin 3.8  3.5 - 5.2 g/dL   AST 206 (*) 0 - 37 U/L   ALT 186 (*) 0 - 53 U/L   Alkaline Phosphatase 65  39 - 117 U/L   Total Bilirubin 0.7  0.3 - 1.2 mg/dL   GFR calc non Af Amer >90  >  90 mL/min   GFR calc Af Amer >90  >90 mL/min   Comment: (NOTE)     The eGFR has been calculated using the CKD EPI equation.     This calculation has not been validated in all clinical situations.     eGFR's persistently <90 mL/min signify possible Chronic Kidney     Disease.   Anion gap 16 (*) 5 - 15  LIPASE, BLOOD     Status: None   Collection Time    01/31/14 10:15 AM      Result Value Ref Range   Lipase 28  11 - 59 U/L  I-STAT TROPOININ, ED     Status: None   Collection Time    01/31/14 10:23 AM      Result Value Ref Range   Troponin i, poc 0.04  0.00 - 0.08 ng/mL   Comment 3            Comment: Due to the release kinetics of cTnI,     a negative result within the first hours      of the onset of symptoms does not rule out     myocardial infarction with certainty.     If myocardial infarction is still suspected,     repeat the test at appropriate intervals.    Physical Findings: AIMS: Facial and Oral Movements Muscles of Facial Expression: None, normal Lips and Perioral Area: None, normal Jaw: None, normal Tongue: None, normal,Extremity Movements Upper (arms, wrists, hands, fingers): None, normal Lower (legs, knees, ankles, toes): None, normal, Trunk Movements Neck, shoulders, hips: None, normal, Overall Severity Severity of abnormal movements (highest score from questions above): None, normal Incapacitation due to abnormal movements: None, normal [name] awareness of abnormal movements (rate only patient's report): No Awareness,    CIWA:  CIWA-Ar Total: 3 COWS:     Treatment Plan Summary: Daily contact with patient to assess and evaluate symptoms and progress in treatment Medication management  Plan:  1. Continue crisis management and stabilization.  2. Medication management:  -Continue Trazodone 100 mg hs for insomnia -Continue Ativan taper to safety manage symptoms of alcohol withdrawal  -Continue Abilify 10 mg at hs for psychosis -Continue Cymbalta 30 mg daily for depression  3. Encouraged patient to attend groups and participate in group counseling sessions and activities.  4. Discharge plan in progress.  5. Continue current treatment plan.  6. Address health issues: Continue Norvasc 5 mg daily and Lopressor 50 mg BID for Hypertension 7. Continue 1:1 observation due to severe withdrawal symptoms   Medical Decision Making Problem Points:  Established problem, worsening (2), Review of last therapy session (1) and Review of psycho-social stressors (1) Data Points:  Review or order clinical lab tests (1) Review or order medicine tests (1) Review of medication regiment & side effects (2)  I certify that inpatient services furnished can reasonably  be expected to improve the patient's condition.   Elmarie Shiley NP-C 01/31/2014, 2:04 PM  Patient seen, evaluated and I agree with notes by Nurse Practitioner. Corena Pilgrim, MD

## 2014-01-31 NOTE — Progress Notes (Signed)
RN 1:1 note D: Pt sitting up in in bed. Pt is consuming a juice cup at this time. No signs of distress noted. Pt continues to remain awake as he responds to internal stimuli. Sitter at pt's bedside. A: 1:1 observation continues for this  pt. R: No adverse drug reactions noted. Pt receptive to treatment. Pt remains safe at this time.

## 2014-01-31 NOTE — Progress Notes (Signed)
RN 1:1 Note  D: Pt in bed resting in left lateral position. Pt attempting to go to sleep. Pt was previously wandering in his room. Pt appears to be less anxious. No signs of distress noted. Respirations even and unlabored. Sitter present at bedside. A: 1:1 continuous observation remains for this pt.  R: Pt remains safe at this time.

## 2014-02-01 DIAGNOSIS — F332 Major depressive disorder, recurrent severe without psychotic features: Principal | ICD-10-CM

## 2014-02-01 MED ORDER — TRAZODONE HCL 150 MG PO TABS
150.0000 mg | ORAL_TABLET | Freq: Every day | ORAL | Status: DC
  Administered 2014-02-01: 150 mg via ORAL
  Filled 2014-02-01 (×3): qty 1

## 2014-02-01 MED ORDER — TRAZODONE HCL 100 MG PO TABS: 200.0000 mg | ORAL_TABLET | Freq: Every day | ORAL | Status: DC

## 2014-02-01 MED ORDER — GABAPENTIN 100 MG PO CAPS
200.0000 mg | ORAL_CAPSULE | Freq: Three times a day (TID) | ORAL | Status: DC
  Administered 2014-02-01 – 2014-02-06 (×16): 200 mg via ORAL
  Filled 2014-02-01 (×9): qty 2
  Filled 2014-02-01: qty 84
  Filled 2014-02-01 (×5): qty 2
  Filled 2014-02-01: qty 84
  Filled 2014-02-01: qty 2
  Filled 2014-02-01: qty 84
  Filled 2014-02-01 (×3): qty 2

## 2014-02-01 NOTE — Progress Notes (Signed)
RN 1:1 Note  D: Pt in bed resting with eyes closed in right lateral position. Pt appears to be in no signs of distress at this time. Respirations even and unlabored. Sitter at bedside. A: 1:1 continuous observation remains for this pt.  R: Pt remains safe at this time.

## 2014-02-01 NOTE — [facility] Group Notes (Signed)
[facility] LCSW Group Therapy  02/01/2014  1:05 PM  Type of Therapy:  Group therapy  Participation Level:  Did not attend  Summary of Progress/Problems:  Chaplain was here to lead a group on themes of hope and courage.  [name] 02/01/2014 12:33 PM

## 2014-02-01 NOTE — Progress Notes (Signed)
RN 1:1 Note  D: Pt in bed resting with eyes closed in right lateral position. Pt appears to be in no signs of distress at this time. Respirations even and unlabored. Sitter at bedside.  A: 1:1 continuous observation remains for this pt.  R: Pt remains safe at this time.

## 2014-02-01 NOTE — Progress Notes (Signed)
[facility] Group Notes:  (Nursing/MHT/Case Management/Adjunct)  Date:  02/01/2014  Time:  9:11 PM  Type of Therapy:  Group Therapy  Participation Level:  Active  Participation Quality:  Appropriate  Affect:  Appropriate  Cognitive:  Appropriate  Insight:  Good  Engagement in Group:  Engaged  Modes of Intervention:  Socialization and Support  Summary of Progress/Problems: Pt. Rated his energy level 4.  Pt. Stated an early warning sign in when he stops attending his group meeting.  Pt. Stated his was looking to find a new sponsor and is developing new coping skills.  Lanell Persons 02/01/2014, 9:11 PM

## 2014-02-01 NOTE — Progress Notes (Signed)
Pt has been isolated to room for majority of the day. Pt calm and cooperative. Pt does report +AH and +VH. Dr is aware. Pt has had safety sitter all day. Pt took meds as ordered.  RN administered meds as ordered. Continuous checks to ensure Pt safety. RN encouraged Pt to eat and express feelings. Pt continues to have minimal tremors. Pt remains safe in room.

## 2014-02-01 NOTE — Progress Notes (Signed)
1:1 NOTE: Patient seated in dayroom watching television. Calm and cooperative with staff requests. Verbalized complaint of "mild" headache, however, rated headache as "8" on 0 to 10 scale. Patient safety maintained by continuous observation by sitter. 500 mg Naproxen po administered for headache.

## 2014-02-01 NOTE — [facility] Group Notes (Signed)
[facility] LCSW Aftercare Discharge Planning Group Note   02/01/2014 9:50 AM  Participation Quality:  Did not attend    [name]

## 2014-02-01 NOTE — Progress Notes (Signed)
Patient ID: [name], male   DOB: 1981/03/07, 33 y.o.   MRN: [id number] [facility] MD Progress Note  02/01/2014 10:52 AM Pacen Watford  MRN:  [id number] Subjective:   Patient states "I have trouble sleeping and I am still hallucinating.'' Objective:  Patient is seen and chart is reviewed. Patient with long history of Alcohol dependence who was admitted due to worsening depressive symptoms and alcohol withdrawal. Patient continue to endorse feeling depressed, hopeless, worthless and anxiety. Patient expressed frustrations about his inability to stop drinking despite having multiple medical problems from drinking. He continues to verbalize visual and auditory hallucinations. He has been talking to himself as if responding to internal stimuli.  Patient continues to be anxious, fidgety and tremulous. Diagnosis:   DSM5: Total Time spent with patient: 20 minutes  Axis I: Major depressive disorder, recurrent episode           Alcohol abuse with alcohol-induced mood disorder  Axis II: Deferred Axis III:  Past Medical History  Diagnosis Date  . Seizures   . Hypertension   . Depression   . Pancreatitis   . Liver cirrhosis   . Coronary artery disease   . Angina   . Shortness of breath   . Headache(784.0)   . Peripheral vascular disease April 2011    Left Pop  . Hypercholesteremia   . Schizophrenia   . Bipolar 1 disorder   . Cancer of kidney dx'd 04/2013    lt nephrectomy  . Breast CA dx'd 2009    bil w/ bil masectomy and oral meds   Axis IV: economic problems, occupational problems, other psychosocial or environmental problems and problems related to social environment Axis V: 21-30 behavior considerably influenced by delusions or hallucinations OR serious impairment in judgment, communication OR inability to function in almost all areas  ADL's:  Impaired  Sleep: Poor  Appetite:  Poor  Suicidal Ideation:  Denies Homicidal Ideation:  Denies AEB (as evidenced by):  Psychiatric  Specialty Exam: Physical Exam  Review of Systems  Constitutional: Positive for diaphoresis. Negative for fever, chills, weight loss and malaise/fatigue.  HENT: Negative for ear discharge, ear pain, hearing loss, nosebleeds and tinnitus.   Eyes: Negative for blurred vision, double vision, photophobia, pain and discharge.  Respiratory: Negative for cough, hemoptysis, sputum production and shortness of breath.   Cardiovascular: Negative for chest pain, palpitations, orthopnea, claudication and leg swelling.  Gastrointestinal: Positive for nausea. Negative for heartburn, vomiting, abdominal pain, diarrhea, constipation and blood in stool.  Genitourinary: Negative for dysuria, urgency, frequency, hematuria and flank pain.  Musculoskeletal: Negative for back pain, joint pain, myalgias and neck pain.  Skin: Negative for itching and rash.  Neurological: Positive for dizziness, tremors and headaches. Negative for sensory change, speech change, focal weakness, seizures and loss of consciousness.  Endo/Heme/Allergies: Negative for environmental allergies and polydipsia. Does not bruise/bleed easily.  Psychiatric/Behavioral: Positive for depression, hallucinations and substance abuse. The patient is nervous/anxious and has insomnia.     Blood pressure 137/97, pulse 93, temperature 97.9 F (36.6 C), temperature source Oral, resp. rate 16, height '5\' 6"'  (1.676 m), weight 76.204 kg (168 lb), SpO2 98.00%.Body mass index is 27.13 kg/(m^2).  General Appearance: Disheveled  Eye Contact::  Minimal  Speech:  Garbled and Slow  Volume:  Decreased  Mood:  Dysphoric  Affect:  Restricted  Thought Process:  Coherent  Orientation:  Full (Time, Place, and Person)  Thought Content:  Hallucinations: Visual  Suicidal Thoughts:  No  Homicidal Thoughts:  No  Memory:  Immediate;   Fair Recent;   Fair Remote;   Fair  Judgement:  Fair  Insight:  Shallow  Psychomotor Activity:  Psychomotor Retardation  Concentration:   Fair  Recall:  Poor  Fund of Knowledge:Poor  Language: Fair  Akathisia:  No  Handed:  Right  AIMS (if indicated):     Assets:  Desire for Improvement Leisure Time Resilience  Sleep:  Number of Hours: 5.25   Musculoskeletal: Strength & Muscle Tone: within normal limits Gait & Station: normal Patient leans: N/A  Current Medications: Current Facility-Administered Medications  Medication Dose Route Frequency Provider Last Rate Last Dose  . alum & mag hydroxide-simeth (MAALOX/MYLANTA) 200-200-20 MG/5ML suspension 30 mL  30 mL Oral Q4H PRN Laverle Hobby, PA-C      . amLODipine (NORVASC) tablet 5 mg  5 mg Oral Daily Laverle Hobby, PA-C   5 mg at 02/01/14 0848  . ARIPiprazole (ABILIFY) tablet 10 mg  10 mg Oral QHS Neita Garnet, MD   10 mg at 01/31/14 2146  . atorvastatin (LIPITOR) tablet 20 mg  20 mg Oral q1800 Taniela Feltus   20 mg at 01/31/14 1628  . dicyclomine (BENTYL) tablet 20 mg  20 mg Oral Q6H PRN Neita Garnet, MD   20 mg at 01/30/14 0835  . diphenhydrAMINE (BENADRYL) tablet 25 mg  25 mg Oral Q6H PRN Laverle Hobby, PA-C   25 mg at 01/29/14 1043  . DULoxetine (CYMBALTA) DR capsule 30 mg  30 mg Oral Daily Laverle Hobby, PA-C   30 mg at 02/01/14 0848  . gabapentin (NEURONTIN) capsule 200 mg  200 mg Oral TID Anaily Ashbaugh      . LORazepam (ATIVAN) tablet 1 mg  1 mg Oral BID Neita Garnet, MD   1 mg at 02/01/14 0847   Followed by  . [START ON 02/02/2014] LORazepam (ATIVAN) tablet 1 mg  1 mg Oral Daily Neita Garnet, MD      . magnesium hydroxide (MILK OF MAGNESIA) suspension 30 mL  30 mL Oral Daily PRN Laverle Hobby, PA-C      . metoprolol (LOPRESSOR) tablet 50 mg  50 mg Oral BID Laverle Hobby, PA-C   50 mg at 02/01/14 0848  . mirtazapine (REMERON SOL-TAB) disintegrating tablet 15 mg  15 mg Oral QHS Laverle Hobby, PA-C   15 mg at 01/31/14 2146  . multivitamin with minerals tablet 1 tablet  1 tablet Oral Daily Laverle Hobby, PA-C   1 tablet at 02/01/14 0848  .  naproxen (NAPROSYN) tablet 500 mg  500 mg Oral BID PRN Laverle Hobby, PA-C   500 mg at 01/30/14 0234  . OLANZapine zydis (ZYPREXA) disintegrating tablet 5 mg  5 mg Oral Q8H PRN Benjamine Mola, FNP   5 mg at 01/31/14 0144  . SUMAtriptan (IMITREX) tablet 25 mg  25 mg Oral Q2H PRN Benjamine Mola, FNP   25 mg at 01/30/14 2145  . thiamine (VITAMIN B-1) tablet 100 mg  100 mg Oral Daily Laverle Hobby, PA-C   100 mg at 02/01/14 0847  . traZODone (DESYREL) tablet 150 mg  150 mg Oral QHS Larna Capelle        Lab Results:  Results for orders placed during the hospital encounter of 01/28/14 (from the past 48 hour(s))  CBC WITH DIFFERENTIAL     Status: Abnormal   Collection Time    01/31/14 10:15 AM      Result Value Ref Range  WBC 4.1  4.0 - 10.5 K/uL   RBC 4.40  4.22 - 5.81 MIL/uL   Hemoglobin 13.6  13.0 - 17.0 g/dL   HCT 39.0  39.0 - 52.0 %   MCV 88.6  78.0 - 100.0 fL   MCH 30.9  26.0 - 34.0 pg   MCHC 34.9  30.0 - 36.0 g/dL   RDW 15.7 (*) 11.5 - 15.5 %   Platelets 82 (*) 150 - 400 K/uL   Comment: SPECIMEN CHECKED FOR CLOTS     PLATELET COUNT CONFIRMED BY SMEAR   Neutrophils Relative % 51  43 - 77 %   Neutro Abs 2.1  1.7 - 7.7 K/uL   Lymphocytes Relative 33  12 - 46 %   Lymphs Abs 1.3  0.7 - 4.0 K/uL   Monocytes Relative 11  3 - 12 %   Monocytes Absolute 0.5  0.1 - 1.0 K/uL   Eosinophils Relative 4  0 - 5 %   Eosinophils Absolute 0.2  0.0 - 0.7 K/uL   Basophils Relative 1  0 - 1 %   Basophils Absolute 0.0  0.0 - 0.1 K/uL  COMPREHENSIVE METABOLIC PANEL     Status: Abnormal   Collection Time    01/31/14 10:15 AM      Result Value Ref Range   Sodium 137  137 - 147 mEq/L   Potassium 3.6 (*) 3.7 - 5.3 mEq/L   Chloride 98  96 - 112 mEq/L   CO2 23  19 - 32 mEq/L   Glucose, Bld 79  70 - 99 mg/dL   BUN 10  6 - 23 mg/dL   Creatinine, Ser 0.90  0.50 - 1.35 mg/dL   Calcium 10.2  8.4 - 10.5 mg/dL   Total Protein 7.1  6.0 - 8.3 g/dL   Albumin 3.8  3.5 - 5.2 g/dL   AST 206 (*) 0 - 37 U/L    ALT 186 (*) 0 - 53 U/L   Alkaline Phosphatase 65  39 - 117 U/L   Total Bilirubin 0.7  0.3 - 1.2 mg/dL   GFR calc non Af Amer >90  >90 mL/min   GFR calc Af Amer >90  >90 mL/min   Comment: (NOTE)     The eGFR has been calculated using the CKD EPI equation.     This calculation has not been validated in all clinical situations.     eGFR's persistently <90 mL/min signify possible Chronic Kidney     Disease.   Anion gap 16 (*) 5 - 15  LIPASE, BLOOD     Status: None   Collection Time    01/31/14 10:15 AM      Result Value Ref Range   Lipase 28  11 - 59 U/L  I-STAT TROPOININ, ED     Status: None   Collection Time    01/31/14 10:23 AM      Result Value Ref Range   Troponin i, poc 0.04  0.00 - 0.08 ng/mL   Comment 3            Comment: Due to the release kinetics of cTnI,     a negative result within the first hours     of the onset of symptoms does not rule out     myocardial infarction with certainty.     If myocardial infarction is still suspected,     repeat the test at appropriate intervals.    Physical Findings: AIMS: Facial and Oral Movements Muscles of  Facial Expression: None, normal Lips and Perioral Area: None, normal Jaw: None, normal Tongue: None, normal,Extremity Movements Upper (arms, wrists, hands, fingers): None, normal Lower (legs, knees, ankles, toes): None, normal, Trunk Movements Neck, shoulders, hips: None, normal, Overall Severity Severity of abnormal movements (highest score from questions above): None, normal Incapacitation due to abnormal movements: None, normal [name] awareness of abnormal movements (rate only patient's report): No Awareness,    CIWA:  CIWA-Ar Total: 1 COWS:     Treatment Plan Summary: Daily contact with patient to assess and evaluate symptoms and progress in treatment Medication management  Plan:  1. Continue crisis management and stabilization.  2. Medication management:  -Increase  Trazodone 150 mg hs for  insomnia -Continue Ativan taper to safety manage symptoms of alcohol withdrawal  -Discontinue Abilify 10 mg at hs for psychosis -Initiate Haldol 56m po bid for delusion/psychosis. -Initiate Gabapentin 2051mpo tid for anxiety/alcohol withdrawal. -Continue Cymbalta 30 mg daily for depression  3. Encouraged patient to attend groups and participate in group counseling sessions and activities.  4. Discharge plan in progress.  5. Continue current treatment plan.  6. Address health issues: Continue Norvasc 5 mg daily and Lopressor 50 mg BID for Hypertension 7. Continue 1:1 observation due to severe withdrawal symptoms   Medical Decision Making Problem Points:  Established problem, worsening (2), Review of last therapy session (1) and Review of psycho-social stressors (1) Data Points:  Review or order clinical lab tests (1) Review or order medicine tests (1) Review of medication regiment & side effects (2)  I certify that inpatient services furnished can reasonably be expected to improve the patient's condition.   AkCorena PilgrimMD 02/01/2014, 10:52 AM

## 2014-02-01 NOTE — Progress Notes (Signed)
Patient ID: [name], male   DOB: 07-Jan-1981, 33 y.o.   MRN: [id number] D)  Has been out and about on the hall this evening, has been pleasant and cooperative, states still hearing voices at times, depressed, has been unable to contract for safety.  A)  Remains on 1:1 obs for safety with MHT at bedside or close by.  R)  Safety maintained.

## 2014-02-02 MED ORDER — AZITHROMYCIN 250 MG PO TABS
250.0000 mg | ORAL_TABLET | Freq: Every day | ORAL | Status: DC
  Administered 2014-02-03 – 2014-02-06 (×4): 250 mg via ORAL
  Filled 2014-02-02 (×5): qty 1

## 2014-02-02 MED ORDER — AZITHROMYCIN 500 MG PO TABS
500.0000 mg | ORAL_TABLET | Freq: Once | ORAL | Status: AC
  Administered 2014-02-02: 500 mg via ORAL
  Filled 2014-02-02: qty 2
  Filled 2014-02-02: qty 1

## 2014-02-02 MED ORDER — TRAZODONE HCL 100 MG PO TABS
200.0000 mg | ORAL_TABLET | Freq: Every day | ORAL | Status: DC
  Administered 2014-02-02 – 2014-02-05 (×4): 200 mg via ORAL
  Filled 2014-02-02 (×3): qty 2
  Filled 2014-02-02: qty 28
  Filled 2014-02-02 (×3): qty 2

## 2014-02-02 NOTE — Progress Notes (Signed)
D: Pt on 1:1 for safety.  Pt complained of stomach cramps and agitation.  Pt denies SI/HI.  Pt reports VH, states he saw a person earlier.  A: 1:1 remains at arms length.  Stomach cramps and agitation relieved by PRN medication as ordered.  Pt in no acute distress at this time.  R: Pt safe in day room, will continue to monitor as ordered.

## 2014-02-02 NOTE — Progress Notes (Addendum)
Patient ID: [name], male   DOB: [birth year], 33 y.o.   MRN: [id number] [facility] MD Progress Note  02/02/2014 10:09 AM [name]  MRN:  [id number] Subjective:   Patient states  He is starting to feel " a little better today" Objective:  Patient states he is starting to feel better, although still describes his mood as depressed and sad. He feels as if he still has some  Residual ETOH withdrawal symptoms, and states he feels somewhat jittery and tremulous, but no significant tremors are apparent at this time and he does not appear to be in any acute distress. He describes cravings for alcohol.  He states he has had ongoing hallucinations, but so far this morning has had none, and at this time does not appear internally preoccupied. Patient has been going to some groups, but at times remains isolative in his room. Denies medication side effects. LFTs elevated, but decreased compared to prior. Low platelet count, similar to prior CBC . Diagnosis:   DSM5: Total Time spent with patient: 20 minutes  Axis I: Major depressive disorder, recurrent episode           Alcohol abuse with alcohol-induced mood disorder  Axis II: Deferred Axis III:  Past Medical History  Diagnosis Date  . Seizures   . Hypertension   . Depression   . Pancreatitis   . Liver cirrhosis   . Coronary artery disease   . Angina   . Shortness of breath   . Headache(784.0)   . Peripheral vascular disease April 2011    Left Pop  . Hypercholesteremia   . Schizophrenia   . Bipolar 1 disorder   . Cancer of kidney dx'd 04/2013    lt nephrectomy  . Breast CA dx'd 2009    bil w/ bil masectomy and oral meds   Axis IV: economic problems, occupational problems, other psychosocial or environmental problems and problems related to social environment Axis V: 21-30 behavior considerably influenced by delusions or hallucinations OR serious impairment in judgment, communication OR inability to function in almost all areas  ADL's:   Impaired  Sleep: Fair, but states it is improving  Appetite:  Poor  Suicidal Ideation:  Denies Homicidal Ideation:  Denies AEB (as evidenced by):  Psychiatric Specialty Exam: Physical Exam  Review of Systems  Constitutional: Positive for diaphoresis. Negative for fever, chills, weight loss and malaise/fatigue.  HENT: Negative for ear discharge, ear pain, hearing loss, nosebleeds and tinnitus.   Eyes: Negative for blurred vision, double vision, photophobia, pain and discharge.  Respiratory: Negative for cough, hemoptysis, sputum production and shortness of breath.   Cardiovascular: Negative for chest pain, palpitations, orthopnea, claudication and leg swelling.  Gastrointestinal: Positive for nausea. Negative for heartburn, vomiting, abdominal pain, diarrhea, constipation and blood in stool.  Genitourinary: Negative for dysuria, urgency, frequency, hematuria and flank pain.  Musculoskeletal: Negative for back pain, joint pain, myalgias and neck pain.  Skin: Negative for itching and rash.  Neurological: Positive for dizziness, tremors and headaches. Negative for sensory change, speech change, focal weakness, seizures and loss of consciousness.  Endo/Heme/Allergies: Negative for environmental allergies and polydipsia. Does not bruise/bleed easily.  Psychiatric/Behavioral: Positive for depression, hallucinations and substance abuse. The patient is nervous/anxious and has insomnia.     Blood pressure 122/90, pulse 87, temperature 98.9 F (37.2 C), temperature source Oral, resp. rate 16, height '5\' 6"'  (1.676 m), weight 76.204 kg (168 lb), SpO2 98.00%.Body mass index is 27.13 kg/(m^2).  General Appearance: Fairly Groomed  Engineer, water::  Good  Speech:  Normal Rate  Volume:  Decreased  Mood:  Depressed, but states that today he feels somewhat better  Affect:  Constricted  Thought Process:  Coherent  Orientation:  Full (Time, Place, and Person)  Thought Content:  Reports recent  hallucinations, but states that so far today has had none and does not currently appear internally preoccupied.   Suicidal Thoughts:  No- currently denies any suicidal ideations, however, does state that he feels " safer" with his current one to one observation  Homicidal Thoughts:  No  Memory: NA  Judgement:  Fair  Insight:  Fair  Psychomotor Activity:  Psychomotor Retardation  Concentration:  Fair  Recall:  Poor  Fund of Knowledge:Poor  Language: Fair  Akathisia:  No  Handed:  Right  AIMS (if indicated):     Assets:  Desire for Improvement Leisure Time Resilience  Sleep:  Number of Hours: 4   Musculoskeletal: Strength & Muscle Tone: within normal limits Gait & Station: normal Patient leans: N/A  Current Medications: Current Facility-Administered Medications  Medication Dose Route Frequency Provider Last Rate Last Dose  . alum & mag hydroxide-simeth (MAALOX/MYLANTA) 200-200-20 MG/5ML suspension 30 mL  30 mL Oral Q4H PRN Laverle Hobby, PA-C      . amLODipine (NORVASC) tablet 5 mg  5 mg Oral Daily Laverle Hobby, PA-C   5 mg at 02/02/14 0759  . ARIPiprazole (ABILIFY) tablet 10 mg  10 mg Oral QHS Neita Garnet, MD   10 mg at 02/01/14 2106  . atorvastatin (LIPITOR) tablet 20 mg  20 mg Oral q1800 Mojeed Akintayo   20 mg at 02/01/14 1823  . dicyclomine (BENTYL) tablet 20 mg  20 mg Oral Q6H PRN Neita Garnet, MD   20 mg at 02/02/14 0248  . diphenhydrAMINE (BENADRYL) tablet 25 mg  25 mg Oral Q6H PRN Laverle Hobby, PA-C   25 mg at 01/29/14 1043  . DULoxetine (CYMBALTA) DR capsule 30 mg  30 mg Oral Daily Laverle Hobby, PA-C   30 mg at 02/02/14 0759  . gabapentin (NEURONTIN) capsule 200 mg  200 mg Oral TID Mojeed Akintayo   200 mg at 02/02/14 0759  . magnesium hydroxide (MILK OF MAGNESIA) suspension 30 mL  30 mL Oral Daily PRN Laverle Hobby, PA-C      . metoprolol (LOPRESSOR) tablet 50 mg  50 mg Oral BID Laverle Hobby, PA-C   50 mg at 02/02/14 0758  . mirtazapine (REMERON  SOL-TAB) disintegrating tablet 15 mg  15 mg Oral QHS Laverle Hobby, PA-C   15 mg at 02/01/14 2107  . multivitamin with minerals tablet 1 tablet  1 tablet Oral Daily Laverle Hobby, PA-C   1 tablet at 02/02/14 0759  . naproxen (NAPROSYN) tablet 500 mg  500 mg Oral BID PRN Laverle Hobby, PA-C   500 mg at 02/01/14 1821  . OLANZapine zydis (ZYPREXA) disintegrating tablet 5 mg  5 mg Oral Q8H PRN Benjamine Mola, FNP   5 mg at 02/02/14 0248  . SUMAtriptan (IMITREX) tablet 25 mg  25 mg Oral Q2H PRN Benjamine Mola, FNP   25 mg at 02/02/14 0253  . thiamine (VITAMIN B-1) tablet 100 mg  100 mg Oral Daily Laverle Hobby, PA-C   100 mg at 02/02/14 0758  . traZODone (DESYREL) tablet 150 mg  150 mg Oral QHS Mojeed Akintayo   150 mg at 02/01/14 2106    Lab Results:  Results for orders placed  during the hospital encounter of 01/28/14 (from the past 48 hour(s))  CBC WITH DIFFERENTIAL     Status: Abnormal   Collection Time    01/31/14 10:15 AM      Result Value Ref Range   WBC 4.1  4.0 - 10.5 K/uL   RBC 4.40  4.22 - 5.81 MIL/uL   Hemoglobin 13.6  13.0 - 17.0 g/dL   HCT 39.0  39.0 - 52.0 %   MCV 88.6  78.0 - 100.0 fL   MCH 30.9  26.0 - 34.0 pg   MCHC 34.9  30.0 - 36.0 g/dL   RDW 15.7 (*) 11.5 - 15.5 %   Platelets 82 (*) 150 - 400 K/uL   Comment: SPECIMEN CHECKED FOR CLOTS     PLATELET COUNT CONFIRMED BY SMEAR   Neutrophils Relative % 51  43 - 77 %   Neutro Abs 2.1  1.7 - 7.7 K/uL   Lymphocytes Relative 33  12 - 46 %   Lymphs Abs 1.3  0.7 - 4.0 K/uL   Monocytes Relative 11  3 - 12 %   Monocytes Absolute 0.5  0.1 - 1.0 K/uL   Eosinophils Relative 4  0 - 5 %   Eosinophils Absolute 0.2  0.0 - 0.7 K/uL   Basophils Relative 1  0 - 1 %   Basophils Absolute 0.0  0.0 - 0.1 K/uL  COMPREHENSIVE METABOLIC PANEL     Status: Abnormal   Collection Time    01/31/14 10:15 AM      Result Value Ref Range   Sodium 137  137 - 147 mEq/L   Potassium 3.6 (*) 3.7 - 5.3 mEq/L   Chloride 98  96 - 112 mEq/L   CO2 23   19 - 32 mEq/L   Glucose, Bld 79  70 - 99 mg/dL   BUN 10  6 - 23 mg/dL   Creatinine, Ser 0.90  0.50 - 1.35 mg/dL   Calcium 10.2  8.4 - 10.5 mg/dL   Total Protein 7.1  6.0 - 8.3 g/dL   Albumin 3.8  3.5 - 5.2 g/dL   AST 206 (*) 0 - 37 U/L   ALT 186 (*) 0 - 53 U/L   Alkaline Phosphatase 65  39 - 117 U/L   Total Bilirubin 0.7  0.3 - 1.2 mg/dL   GFR calc non Af Amer >90  >90 mL/min   GFR calc Af Amer >90  >90 mL/min   Comment: (NOTE)     The eGFR has been calculated using the CKD EPI equation.     This calculation has not been validated in all clinical situations.     eGFR's persistently <90 mL/min signify possible Chronic Kidney     Disease.   Anion gap 16 (*) 5 - 15  LIPASE, BLOOD     Status: None   Collection Time    01/31/14 10:15 AM      Result Value Ref Range   Lipase 28  11 - 59 U/L  I-STAT TROPOININ, ED     Status: None   Collection Time    01/31/14 10:23 AM      Result Value Ref Range   Troponin i, poc 0.04  0.00 - 0.08 ng/mL   Comment 3            Comment: Due to the release kinetics of cTnI,     a negative result within the first hours     of the onset of symptoms does not  rule out     myocardial infarction with certainty.     If myocardial infarction is still suspected,     repeat the test at appropriate intervals.    Physical Findings: AIMS: Facial and Oral Movements Muscles of Facial Expression: None, normal Lips and Perioral Area: None, normal Jaw: None, normal Tongue: None, normal,Extremity Movements Upper (arms, wrists, hands, fingers): None, normal Lower (legs, knees, ankles, toes): None, normal, Trunk Movements Neck, shoulders, hips: None, normal, Overall Severity Severity of abnormal movements (highest score from questions above): None, normal Incapacitation due to abnormal movements: None, normal [name] awareness of abnormal movements (rate only patient's report): No Awareness,    CIWA:  CIWA-Ar Total: 6 COWS:     Assessment:  Patient remains  depressed , sad, and has been experiencing hallucinations. At this time vitals are stable and he does not present with current significant symptoms Of alcohol WDL.  He is not endorsing medication side effects at this time. Treatment Plan Summary: Daily contact with patient to assess and evaluate symptoms and progress in treatment Medication management  Plan:  1. Continue crisis management and stabilization.  2. Medication management:  - Trazodone 150 mg hs for insomnia - Ativan taper to safety manage symptoms of alcohol withdrawal  -Abilify 10 mgrs  QDAY  - Gabapentin 263m po tid for anxiety/alcohol withdrawal. - Cymbalta 30 mg daily for depression  Remeron 15 mgrs QHS 3. Encouraged patient to attend groups and participate in group counseling sessions and activities.  4. Discharge plan in progress.  5. Continue current treatment plan.  6. Address health issues: Continue Norvasc 5 mg daily and Lopressor 50 mg BID for Hypertension 7. Continue 1:1 observation due to severe withdrawal symptoms   Medical Decision Making Problem Points:  Established problem, stable/improving (1) and Review of last therapy session (1) Data Points:  Review or order clinical lab tests (1) Review of medication regiment & side effects (2)  I certify that inpatient services furnished can reasonably be expected to improve the patient's condition.   CNeita Garnet MD 02/02/2014, 10:09 AM

## 2014-02-02 NOTE — Progress Notes (Signed)
Patient ID: [name], male   DOB: 1980-09-24, 33 y.o.   MRN: [id number] D)  Was able to go back to sleep and has slept tonight without further c/o's of discomfort.  Has been resting quietly with eyes closed, resp reg, unlabored. A)  Remains of 1:1 obs for safety, mht at bedside.  R)  Safety maintained.

## 2014-02-02 NOTE — [facility] Group Notes (Signed)
[facility] Group Notes:  (Nursing/MHT/Case Management/Adjunct)  Date:  02/02/2014  Time:  10:05AM  Type of Therapy:  Psychoeducational Skills  Participation Level:  Active  Participation Quality:  Appropriate  Affect:  Appropriate  Cognitive:  Appropriate  Insight:  Good  Engagement in Group:  Engaged  Modes of Intervention:  Discussion  Summary of Progress/Problems: pt appropriate in group. Attentive and engaging appropriately   Tretha Sciara 02/02/2014, 11:41 AM

## 2014-02-02 NOTE — Progress Notes (Signed)
Patient ID: [name], male   DOB: [birth year], 33 y.o.   MRN: [id number] D. 1/1 ordered for patient safety. A. 1.1 remains at arms length. Pt is in no acute distress, and is currently watching tv in dayroom with peers. R. Patient is safe, will continue to monitor as ordered.

## 2014-02-02 NOTE — Progress Notes (Signed)
Adult Psychoeducational Group Note  Date:  02/02/2014 Time:  10:56 PM  Group Topic/Focus:  Goals Group:   The focus of this group is to help patients establish daily goals to achieve during treatment and discuss how the patient can incorporate goal setting into their daily lives to aide in recovery.  Participation Level:  Active  Participation Quality:  Appropriate  Affect:  Appropriate  Cognitive:  Appropriate  Insight: Appropriate  Engagement in Group:  Engaged  Modes of Intervention:  Discussion  Additional Comments:  Pt state that he got his meds corrected and that made his day today.  Alexis Goodell R 02/02/2014, 10:56 PM

## 2014-02-02 NOTE — Progress Notes (Signed)
Patient ID: [name], male   DOB: [birth year], 33 y.o.   MRN: [id number] D)  Went to check on pt and make note, but was awake, c/o headache, some nausea, and feeling anxious.  Stated this was the time he usually started drinking.  Was medicated for anxiety, was also given bentyl and some imitrex for his severe headache.  Also was taken some gatorade and ginger ale to sip on.   A)  Remains on 1:11 obs with MHT at bedside, will continue to monitor for response to meds given, continue POC R)  Safety maintained.

## 2014-02-02 NOTE — Progress Notes (Signed)
Patient ID: [name], male   DOB: [birth year], 32 y.o.   MRN: [id number] 1-1 monitoring note. D. Patient presents with depressed mood, affect flat. [name] reports that his auditory hallucinations are improving, and his withdrawal symptoms are minimal. He reports '' I'm feeling a little better. I'm still getting a little shaky and nauseated but I'm doing better. My tooth is abscessed and it busted this morning and green pus came out. Also, I need something else besides trazodone to help me sleep.'' Pt denies any SI/HI at this time and does not appear to be responding to internal stimuli. A. Medications given as ordered. Support and encouragement provided. Discussed above information with Dr. Parke Poisson. 1.1. Remains at bedside for safety. R. Patient is currently resting quietly and in no acute distress. Will continue to monitor q 15 minutes for safety.

## 2014-02-02 NOTE — BHH Group Notes (Signed)
[facility] Group Notes: (Clinical Social Work)   02/02/2014      Type of Therapy:  Group Therapy   Participation Level:  Did Not Attend    Selmer Dominion, LCSW 02/02/2014, 11:52 AM

## 2014-02-02 NOTE — Progress Notes (Signed)
Patient ID: [name], male   DOB: [birth year], 33 y.o.   MRN: [id number] D. 1/1 ordered for patient safety. A. 1.1 remains at arms length. Pt is in no acute distress, and is currently resting quietly in bed. R. Patient is safe, will continue to monitor as ordered.

## 2014-02-02 NOTE — BHH Group Notes (Signed)
[place] [facility] Group Notes:  (Nursing/MHT/Case Management/Adjunct)  Date:  02/02/2014  Time:  9:50AM  Type of Therapy:  Self Inventory  Participation Level:  Active  Participation Quality:  Appropriate  Affect:  Appropriate  Cognitive:  Alert and Appropriate  Insight:  Good  Engagement in Group:  Engaged  Modes of Intervention:  Discussion  Summary of Progress/Problems: pt appropriate in group. Engaging and attentive   Tretha Sciara 02/02/2014, 11:39 AM

## 2014-02-03 MED ORDER — ACAMPROSATE CALCIUM 333 MG PO TBEC
666.0000 mg | DELAYED_RELEASE_TABLET | Freq: Three times a day (TID) | ORAL | Status: DC
  Administered 2014-02-03 – 2014-02-06 (×9): 666 mg via ORAL
  Filled 2014-02-03: qty 2
  Filled 2014-02-03: qty 84
  Filled 2014-02-03 (×4): qty 2
  Filled 2014-02-03: qty 84
  Filled 2014-02-03 (×7): qty 2
  Filled 2014-02-03: qty 84

## 2014-02-03 NOTE — Progress Notes (Signed)
[facility] 1:1 Observation Documentation  For the first (8) hours following discontinuation of 1:1 precautions, a progress note entry by nursing staff should be documented at least every 2 hours, reflecting the patient's behavior, condition, mood, and conversation.  Use the progress notes for additional entries.  Time 1:1 discontinued: 1015   [name] Behavior:  Calm and cooperative  [name] Condition:  No acute distress  [name] Conversation:  ''doing fineLeonia [name] 02/03/2014, 6:20 PM

## 2014-02-03 NOTE — Progress Notes (Signed)
D:  Pt on 1:1 for safety.  Pt appears to be asleep.  Respirations even, unlabored, WNL.  A:  1:1 staff remains at arms length. R:  Will continue to monitor pt as ordered for safety.

## 2014-02-03 NOTE — Progress Notes (Signed)
Patient ID: [name], male   DOB: Apr 27, 1981, 33 y.o.   MRN: [id number] D. Orders received to discontinue 1.1. Monitoring. Pt is alert and oriented and able to contract for safety. Pt is no longer exhibiting withdrawal symptoms. A. Order placed to discontinue 1.1. Monitoring. R. Will continue to monitor q 15 minutes for safety.

## 2014-02-03 NOTE — [facility] Group Notes (Signed)
[facility] Group Notes:  (Clinical Social Work)  02/03/2014   11:15am-12:00pm  Summary of Progress/Problems:  The main focus of today's process group was to listen to a variety of genres of music and to identify that different types of music provoke different responses.  The patient then was able to identify personally what was soothing for them, as well as energizing.  Handouts were used to record feelings evoked, as well as how patient can personally use this knowledge in sleep habits, with depression, and with other symptoms.  The patient expressed understanding of concepts, as well as knowledge of how each type of music affected him/her and how this can be used at home as a wellness/recovery tool.  He engaged completely in group, had broad affect throughout, enjoyed much of the group and was interactive.  Type of Therapy:  Music Therapy   Participation Level:  Active  Participation Quality:  Attentive and Sharing  Affect:  Blunted but broad  Cognitive:  Oriented  Insight:  Engaged  Engagement in Therapy:  Engaged  Modes of Intervention:   Activity, Exploration  Selmer Dominion, LCSW 02/03/2014, 12:30pm

## 2014-02-03 NOTE — Progress Notes (Signed)
D: Pt on 1:1 for safety.  Pt appears to be asleep.  Respirations even, unlabored, WNL.  A: 1:1 staff remains at arms length. R: Will continue to monitor for safety.  No signs of acute distress.

## 2014-02-03 NOTE — Progress Notes (Signed)
[facility] 1:1 Observation Documentation  For the first (8) hours following discontinuation of 1:1 precautions, a progress note entry by nursing staff should be documented at least every 2 hours, reflecting the patient's behavior, condition, mood, and conversation.  Use the progress notes for additional entries.  Time 1:1 discontinued:  1015  [name] Behavior:  Calm cooperative   [name] Condition:  In no acute distress  [name] Conversation:  ''i'm doing fine ''   Leonia Reader 02/03/2014, 1:08 PM

## 2014-02-03 NOTE — Progress Notes (Signed)
[facility] 1:1 Observation Documentation  For the first (8) hours following discontinuation of 1:1 precautions, a progress note entry by nursing staff should be documented at least every 2 hours, reflecting the patient's behavior, condition, mood, and conversation.  Use the progress notes for additional entries.  Time 1:1 discontinued:  1015  [name] Behavior:  Calm cooperative   [name] Condition:  No acute distress  [name] Conversation:  Clear and appropriate.   Leonia Reader 02/03/2014, 6:35 PM

## 2014-02-03 NOTE — Progress Notes (Signed)
Adult Psychoeducational Group Note  Date:  02/03/2014 Time:  8:24 PM  Group Topic/Focus:  Wrap-Up Group:   The focus of this group is to help patients review their daily goal of treatment and discuss progress on daily workbooks.  Participation Level:  Active  Participation Quality:  Appropriate  Affect:  Appropriate  Cognitive:  Appropriate  Insight: Appropriate  Engagement in Group:  Engaged  Modes of Intervention:  Discussion  Additional Comments:The patient expressed that he had a good because he is not on 1:1 and he went to cafe.The patient said that music therapy was good also.  Nash Shearer 02/03/2014, 8:24 PM

## 2014-02-03 NOTE — Progress Notes (Signed)
Patient ID: [name], male   DOB: 1980/08/02, 33 y.o.   MRN: [id number] [facility] MD Progress Note  02/03/2014 2:07 PM [name]  MRN:  [id number] Subjective:   Patient states  That today he feels significantly better. Objective:  I saw patient along with RN and discussed case with staff. At this time describes significant improvement and states he is feeling better. Slept better, mood is better, and currently denies any thoughts of suicide or of hurting himself and feels able to contract for safety. Behavior on unit in good control. No disruptive behaviors,. Denies side effects from current medications. As noted in yesterday's noted, he is concerned about ongoing alcohol cravings. We discussed options and at this time he agrees to [facility] trial- we discussed side effect profile and rationale. Tooth ache improving, now that he is on antibiotic for abscessed tooth.   Diagnosis:  Major depressive disorder, recurrent episode           Alcohol abuse with alcohol-induced mood disorder   Total Time spent with patient: 20 minutes    ADL's:  Improved   Sleep: improved ( trazodone dose was increased yesterday)   Appetite:  improved  Suicidal Ideation:  Denies Homicidal Ideation:  Denies AEB (as evidenced by):  Psychiatric Specialty Exam: Physical Exam  Review of Systems  Constitutional: Positive for diaphoresis. Negative for fever, chills, weight loss and malaise/fatigue.  HENT: Negative for ear discharge, ear pain, hearing loss, nosebleeds and tinnitus.   Eyes: Negative for blurred vision, double vision, photophobia, pain and discharge.  Respiratory: Negative for cough, hemoptysis, sputum production and shortness of breath.   Cardiovascular: Negative for chest pain, palpitations, orthopnea, claudication and leg swelling.  Gastrointestinal: Positive for nausea. Negative for heartburn, vomiting, abdominal pain, diarrhea, constipation and blood in stool.  Genitourinary: Negative for  dysuria, urgency, frequency, hematuria and flank pain.  Musculoskeletal: Negative for back pain, joint pain, myalgias and neck pain.  Skin: Negative for itching and rash.  Neurological: Positive for dizziness, tremors and headaches. Negative for sensory change, speech change, focal weakness, seizures and loss of consciousness.  Endo/Heme/Allergies: Negative for environmental allergies and polydipsia. Does not bruise/bleed easily.  Psychiatric/Behavioral: Positive for depression, hallucinations and substance abuse. The patient is nervous/anxious and has insomnia.     Blood pressure 107/78, pulse 82, temperature 97 F (36.1 C), temperature source Oral, resp. rate 20, height 5\' 6"  (1.676 m), weight 76.204 kg (168 lb), SpO2 98.00%.Body mass index is 27.13 kg/(m^2).  General Appearance: improved grooming   Eye Contact::  Good  Speech:  Normal Rate  Volume:  Decreased  Mood:  Improved mood   Affect: Improved range of affect   Thought Process:  Coherent  Orientation:  Full (Time, Place, and Person)  Thought Content:  Denies hallucinations at this time and does not appear internally preoccupied. No delusions expressed today   Suicidal Thoughts:  No- currently denies any suicidal ideations- and today denies any self injurious  Thoughts   Homicidal Thoughts:  No  Memory: NA  Judgement:  Fair  Insight:  Fair  Psychomotor Activity:  Improved   Concentration:  Fair  Recall:  Poor  Fund of Knowledge:Poor  Language: Fair  Akathisia:  No  Handed:  Right  AIMS (if indicated):     Assets:  Desire for Improvement Leisure Time Resilience  Sleep:  Number of Hours: 5   Musculoskeletal: Strength & Muscle Tone: within normal limits Gait & Station: normal Patient leans: N/A  Current Medications: Current Facility-Administered Medications  Medication Dose Route Frequency Provider Last Rate Last Dose  . alum & mag hydroxide-simeth (MAALOX/MYLANTA) 200-200-20 MG/5ML suspension 30 mL  30 mL Oral Q4H  PRN Laverle Hobby, PA-C      . amLODipine (NORVASC) tablet 5 mg  5 mg Oral Daily Laverle Hobby, PA-C   5 mg at 02/03/14 0747  . ARIPiprazole (ABILIFY) tablet 10 mg  10 mg Oral QHS Neita Garnet, MD   10 mg at 02/02/14 2103  . atorvastatin (LIPITOR) tablet 20 mg  20 mg Oral q1800 Mojeed Akintayo   20 mg at 02/02/14 1711  . azithromycin (ZITHROMAX) tablet 250 mg  250 mg Oral Daily Benjamine Mola, FNP   250 mg at 02/03/14 0747  . dicyclomine (BENTYL) tablet 20 mg  20 mg Oral Q6H PRN Neita Garnet, MD   20 mg at 02/02/14 2022  . diphenhydrAMINE (BENADRYL) tablet 25 mg  25 mg Oral Q6H PRN Laverle Hobby, PA-C   25 mg at 01/29/14 1043  . DULoxetine (CYMBALTA) DR capsule 30 mg  30 mg Oral Daily Laverle Hobby, PA-C   30 mg at 02/03/14 0746  . gabapentin (NEURONTIN) capsule 200 mg  200 mg Oral TID Mojeed Akintayo   200 mg at 02/03/14 1219  . magnesium hydroxide (MILK OF MAGNESIA) suspension 30 mL  30 mL Oral Daily PRN Laverle Hobby, PA-C      . metoprolol (LOPRESSOR) tablet 50 mg  50 mg Oral BID Laverle Hobby, PA-C   50 mg at 02/03/14 0746  . mirtazapine (REMERON SOL-TAB) disintegrating tablet 15 mg  15 mg Oral QHS Laverle Hobby, PA-C   15 mg at 02/02/14 2104  . multivitamin with minerals tablet 1 tablet  1 tablet Oral Daily Laverle Hobby, PA-C   1 tablet at 02/03/14 0747  . naproxen (NAPROSYN) tablet 500 mg  500 mg Oral BID PRN Laverle Hobby, PA-C   500 mg at 02/02/14 2302  . OLANZapine zydis (ZYPREXA) disintegrating tablet 5 mg  5 mg Oral Q8H PRN Benjamine Mola, FNP   5 mg at 02/02/14 2106  . SUMAtriptan (IMITREX) tablet 25 mg  25 mg Oral Q2H PRN Benjamine Mola, FNP   25 mg at 02/02/14 0253  . thiamine (VITAMIN B-1) tablet 100 mg  100 mg Oral Daily Laverle Hobby, PA-C   100 mg at 02/03/14 0746  . traZODone (DESYREL) tablet 200 mg  200 mg Oral QHS Benjamine Mola, FNP   200 mg at 02/02/14 2103    Lab Results:  No results found for this or any previous visit (from the past 48  hour(s)).  Physical Findings: AIMS: Facial and Oral Movements Muscles of Facial Expression: None, normal Lips and Perioral Area: None, normal Jaw: None, normal Tongue: None, normal,Extremity Movements Upper (arms, wrists, hands, fingers): None, normal Lower (legs, knees, ankles, toes): None, normal, Trunk Movements Neck, shoulders, hips: None, normal, Overall Severity Severity of abnormal movements (highest score from questions above): None, normal Incapacitation due to abnormal movements: None, normal [name] awareness of abnormal movements (rate only patient's report): No Awareness,    CIWA:  CIWA-Ar Total: 5 COWS:     Assessment:  Patient significantly improved compared to prior and today presents with a fuller range of affect, and improved mood. Behavior has remained calm and in good control. Currently no SI or self injurious thoughts endorsed.  Treatment Plan Summary: Daily contact with patient to assess and evaluate symptoms and progress in treatment  Medication management  Plan:  1. Continue crisis management and stabilization.  2. Medication management:  - Trazodone 200 mg hs for insomnia - Patient agrees to start CAMPRAL 666 mgrs TID. - Abilify 10 mgrs  QDAY  - Gabapentin 200mg  po tid for anxiety/alcohol withdrawal. - Cymbalta 30 mg daily for depression   -Remeron 15 mgrs QHS 3. As discussed with Nursing staff, Discontinue 1:1 observation at this time   Medical Decision Making Problem Points:  Established problem, stable/improving (1) and Review of last therapy session (1) Data Points:  Review of medication regiment & side effects (2) Review of new medications or change in dosage (2)  I certify that inpatient services furnished can reasonably be expected to improve the patient's condition.   Neita Garnet, MD 02/03/2014, 2:07 PM

## 2014-02-03 NOTE — Progress Notes (Signed)
[facility] 1:1 Observation Documentation  For the first (8) hours following discontinuation of 1:1 precautions, a progress note entry by nursing staff should be documented at least every 2 hours, reflecting the patient's behavior, condition, mood, and conversation.  Use the progress notes for additional entries.  Time 1:1 discontinued:  1015  [name] Behavior:  Resting quietly   [name] Condition:  No acute distress  [name] Conversation:  Resting currently, RR even and unlabored.  Leonia Reader 02/03/2014, 2:29 PM

## 2014-02-04 MED ORDER — CHLORHEXIDINE GLUCONATE 0.12 % MT SOLN
15.0000 mL | Freq: Four times a day (QID) | OROMUCOSAL | Status: DC
  Administered 2014-02-04 – 2014-02-06 (×7): 15 mL via OROMUCOSAL
  Filled 2014-02-04 (×16): qty 15

## 2014-02-04 NOTE — B[facility] Group Notes (Signed)
[facility] LCSW Aftercare Discharge Planning Group Note   02/04/2014 9:58 AM  Participation Quality:  Engaged  Mood/Affect:  Depressed  Depression Rating:    Anxiety Rating:    Thoughts of Suicide:  No Will you contract for safety?   NA  Current AVH:  No  Plan for Discharge/Comments:  "I got off the 1:1 yesterday.  I still want to go to rehab.  I have a bad tooth infection that is really hurting me today."  Told him I started the paperwork for [facility], and would send it in today.  Transportation Means:   Supports:  [name]

## 2014-02-04 NOTE — Progress Notes (Signed)
D:  Pt presents with flat affect, depressed mood.  Pt with minimal peer interaction.  Interacted with staff appropriately.  Pt denies SI/HI.  Denies AH/VH.  Pt reported abdominal pain and tooth pain, causing headache.  Reported tooth pain/sensitivity after eating ice cream.  When pt was asked about his day, he reported "it was okay.  I'm just happy to be off of 1:1."  Pt then reported he stayed in his room most of the day because he wasn't feeling well.   A:  Medication administered per order.  PRN medications for pain administered.  Support and encouragement provided.  Heat applied to pain site.  Safety maintained. R: Pt is in no acute distress.  Will continue to monitor for safety.

## 2014-02-04 NOTE — Progress Notes (Signed)
Patient ID: [name], male   DOB: May 05, 1981, 33 y.o.   MRN: [id number] [facility] MD Progress Note  02/04/2014 10:38 AM [name]  MRN:  [id number] Subjective:   "From a psychiatry standpoint, I feel much better. I'm not hallucinating, I'm not suicidal, I'm not homicidal, I'm sleeping well. My mouth and face just hurt so bad. I don't think the antibiotic is working and it has green/brown drainage after it kinda popped earlier today and now I have some chills. I feel fine except that. It really hurts and I think the swelling is worse. "  Diagnosis:  Major depressive disorder, recurrent episode           Alcohol abuse with alcohol-induced mood disorder   Total Time spent with patient: 25 minutes    ADL's:  Improved   Sleep: improved ( trazodone dose was increased yesterday)   Appetite:  improved  Suicidal Ideation:  Denies Homicidal Ideation:  Denies AEB (as evidenced by):  Psychiatric Specialty Exam: Physical Exam  Review of Systems  Constitutional: Positive for diaphoresis. Negative for fever, chills, weight loss and malaise/fatigue.  HENT: Negative for ear discharge, ear pain, hearing loss, nosebleeds and tinnitus.   Eyes: Negative.  Negative for blurred vision, double vision, photophobia, pain and discharge.  Respiratory: Negative.  Negative for cough, hemoptysis, sputum production and shortness of breath.   Cardiovascular: Negative.  Negative for chest pain, palpitations, orthopnea, claudication and leg swelling.  Gastrointestinal: Positive for nausea. Negative for heartburn, vomiting, abdominal pain, diarrhea, constipation and blood in stool.  Genitourinary: Negative.  Negative for dysuria, urgency, frequency, hematuria and flank pain.  Musculoskeletal: Negative.  Negative for back pain, joint pain, myalgias and neck pain.  Skin: Negative.  Negative for itching and rash.  Neurological: Positive for dizziness, tremors and headaches. Negative for sensory change, speech change,  focal weakness, seizures and loss of consciousness.  Endo/Heme/Allergies: Negative.  Negative for environmental allergies and polydipsia. Does not bruise/bleed easily.  Psychiatric/Behavioral: Positive for depression, hallucinations and substance abuse. The patient is nervous/anxious and has insomnia.     Blood pressure 131/98, pulse 76, temperature 98 F (36.7 C), temperature source Oral, resp. rate 20, height 5\' 6"  (1.676 m), weight 76.204 kg (168 lb), SpO2 98.00%.Body mass index is 27.13 kg/(m^2).  General Appearance: improved grooming   Eye Contact::  Good  Speech:  Normal Rate  Volume:  Decreased  Mood:  Improved mood   Affect: Improved range of affect   Thought Process:  Coherent  Orientation:  Full (Time, Place, and Person)  Thought Content:  Denies hallucinations at this time and does not appear internally preoccupied. No delusions expressed today   Suicidal Thoughts:  No- currently denies any suicidal ideations- and today denies any self injurious  Thoughts   Homicidal Thoughts:  No  Memory: NA  Judgement:  Fair  Insight:  Fair  Psychomotor Activity:  Improved   Concentration:  Fair  Recall:  Poor  Fund of Knowledge:Poor  Language: Fair  Akathisia:  No  Handed:  Right  AIMS (if indicated):     Assets:  Desire for Improvement Leisure Time Resilience  Sleep:  Number of Hours: 6.75   Musculoskeletal: Strength & Muscle Tone: within normal limits Gait & Station: normal Patient leans: N/A  Current Medications: Current Facility-Administered Medications  Medication Dose Route Frequency Provider Last Rate Last Dose  . acamprosate (CAMPRAL) tablet 666 mg  666 mg Oral TID WC Neita Garnet, MD   666 mg at 02/04/14 0755  .  alum & mag hydroxide-simeth (MAALOX/MYLANTA) 200-200-20 MG/5ML suspension 30 mL  30 mL Oral Q4H PRN Laverle Hobby, PA-C      . amLODipine (NORVASC) tablet 5 mg  5 mg Oral Daily Laverle Hobby, PA-C   5 mg at 02/04/14 0756  . ARIPiprazole (ABILIFY) tablet  10 mg  10 mg Oral QHS Neita Garnet, MD   10 mg at 02/03/14 2058  . atorvastatin (LIPITOR) tablet 20 mg  20 mg Oral q1800 Daquane Aguilar   20 mg at 02/03/14 1700  . azithromycin (ZITHROMAX) tablet 250 mg  250 mg Oral Daily Benjamine Mola, FNP   250 mg at 02/04/14 0755  . dicyclomine (BENTYL) tablet 20 mg  20 mg Oral Q6H PRN Neita Garnet, MD   20 mg at 02/02/14 2022  . diphenhydrAMINE (BENADRYL) tablet 25 mg  25 mg Oral Q6H PRN Laverle Hobby, PA-C   25 mg at 01/29/14 1043  . DULoxetine (CYMBALTA) DR capsule 30 mg  30 mg Oral Daily Laverle Hobby, PA-C   30 mg at 02/04/14 0756  . gabapentin (NEURONTIN) capsule 200 mg  200 mg Oral TID Posey Jasmin   200 mg at 02/04/14 0755  . magnesium hydroxide (MILK OF MAGNESIA) suspension 30 mL  30 mL Oral Daily PRN Laverle Hobby, PA-C      . metoprolol (LOPRESSOR) tablet 50 mg  50 mg Oral BID Laverle Hobby, PA-C   50 mg at 02/04/14 0755  . mirtazapine (REMERON SOL-TAB) disintegrating tablet 15 mg  15 mg Oral QHS Laverle Hobby, PA-C   15 mg at 02/03/14 2059  . multivitamin with minerals tablet 1 tablet  1 tablet Oral Daily Laverle Hobby, PA-C   1 tablet at 02/04/14 0755  . naproxen (NAPROSYN) tablet 500 mg  500 mg Oral BID PRN Laverle Hobby, PA-C   500 mg at 02/04/14 0814  . OLANZapine zydis (ZYPREXA) disintegrating tablet 5 mg  5 mg Oral Q8H PRN Benjamine Mola, FNP   5 mg at 02/02/14 2106  . SUMAtriptan (IMITREX) tablet 25 mg  25 mg Oral Q2H PRN Benjamine Mola, FNP   25 mg at 02/03/14 2202  . thiamine (VITAMIN B-1) tablet 100 mg  100 mg Oral Daily Laverle Hobby, PA-C   100 mg at 02/04/14 0756  . traZODone (DESYREL) tablet 200 mg  200 mg Oral QHS Benjamine Mola, FNP   200 mg at 02/03/14 2058    Lab Results:  No results found for this or any previous visit (from the past 62 hour(s)).  Physical Findings: AIMS: Facial and Oral Movements Muscles of Facial Expression: None, normal Lips and Perioral Area: None, normal Jaw: None,  normal Tongue: None, normal,Extremity Movements Upper (arms, wrists, hands, fingers): None, normal Lower (legs, knees, ankles, toes): None, normal, Trunk Movements Neck, shoulders, hips: None, normal, Overall Severity Severity of abnormal movements (highest score from questions above): None, normal Incapacitation due to abnormal movements: None, normal [name] awareness of abnormal movements (rate only patient's report): No Awareness,    CIWA:  CIWA-Ar Total: 1 COWS:     Assessment:  Patient significantly improved compared to prior and today presents with a fuller range of affect, and improved mood. Behavior has remained calm and in good control. Currently no SI or self injurious thoughts endorsed.  Treatment Plan Summary: Daily contact with patient to assess and evaluate symptoms and progress in treatment Medication management  Plan:  1. Continue crisis management and stabilization.  2. Medication management:  - Trazodone 200 mg hs for insomnia - Patient agrees to start CAMPRAL 666 mgrs TID.  - Abilify 10 mgrs  QDAY  - Gabapentin 200mg  po tid for anxiety/alcohol withdrawal. - Cymbalta 30 mg daily for depression   -Remeron 15 mgrs QHS 3. As discussed with Nursing staff, Discontinue 1:1 observation at this time   Medical Decision Making Problem Points:  Established problem, stable/improving (1) and Review of last therapy session (1) Data Points:  Review of medication regiment & side effects (2) Review of new medications or change in dosage (2)  I certify that inpatient services furnished can reasonably be expected to improve the patient's condition.   Benjamine Mola, FNP-BC 02/04/2014, 10:38 AM  Patient seen, evaluated and I agree with notes by Nurse Practitioner. Corena Pilgrim, MD

## 2014-02-04 NOTE — Progress Notes (Signed)
The focus of this group is to help patients review their daily goal of treatment and discuss progress on daily workbooks. Pt attended the evening group session and responded to all discussion prompts from the [place]. Pt shared that today was not a good day due to his tooth pain, which persisted despite his medications. Pt told the group he planned to take care of himself upon discharge by going to an outpatient program. Pt requested towels from the Writer, which were given to him following group. Pt's affect was appropriate and he appeared engaged in the discussion.

## 2014-02-04 NOTE — [facility] Group Notes (Signed)
[place] LCSW Group Therapy  02/04/2014 1:15 pm  Type of Therapy: Process Group Therapy  Participation Level:  Active  Participation Quality:  Appropriate  Affect:  Flat  Cognitive:  Oriented  Insight:  Improving  Engagement in Group:  Limited  Engagement in Therapy:  Limited  Modes of Intervention:  Activity, Clarification, Education, Problem-solving and Support  Summary of Progress/Problems: Today's group addressed the issue of overcoming obstacles.  Patients were asked to identify their biggest obstacle post d/c that stands in the way of their on-going success, and then problem solve as to how to manage this.  Pt initially stated he was in too much pain to come to group, and he had been instructed to stay in bed.  I pointed out that group attendance would be expected in rehab, and left him.  He ended up coming to group, and participating.  Stated he's an alcoholic, and his biggest challenge is getting bored and complacent, and then he tries one little drink, which opens up a huge pit that swallows him up.  Talked honestly about trying to hide use, using breath mints, etc.  When challenged by others, admitted that "everytime I think I have hit rock bottom, another door opens and it goes even deeper."  "My father drank himself to [place].  He was 42.  I'm afraid the same thing will happen to me if I don't quit.  My liver is already tore up."   Re-emphasized his commitment to going to rehab, and then to outpt.  [name] 02/04/2014   3:12 PM

## 2014-02-04 NOTE — Progress Notes (Addendum)
Pt presents with flat affect and depressed mood. Pt c/o a toothache/pain d/t having an abscess. Pt reported,  "Yellowish/brown" drainage from his oral cavity. Facial swelling noted by Probation officer. Np made aware. Pt awaiting medical consult for treatment per Heloise Purpura, NP.  Pt reported that he is no longer having hallucinations today. Pt did not appear delusional during shift assessment. Per pt, last night was the best sleep he has had in a longtime. Pt has minimal interaction d/t his severe toothache. Pt denies having any withdrawal symptoms. No withdrawal symptoms noted by Probation officer. Medications administered as ordered per MD. Prn meds administered as ordered per MD. Verbal support given. Pt encouraged to attend groups. 15 minute checks performed for safety. Pt safety maintained at this time.

## 2014-02-05 MED ORDER — CLINDAMYCIN HCL 300 MG PO CAPS
300.0000 mg | ORAL_CAPSULE | Freq: Four times a day (QID) | ORAL | Status: DC
  Administered 2014-02-05 – 2014-02-06 (×5): 300 mg via ORAL
  Filled 2014-02-05: qty 1
  Filled 2014-02-05: qty 26
  Filled 2014-02-05 (×2): qty 1
  Filled 2014-02-05: qty 26
  Filled 2014-02-05: qty 2
  Filled 2014-02-05: qty 26
  Filled 2014-02-05: qty 1
  Filled 2014-02-05: qty 26
  Filled 2014-02-05 (×4): qty 1

## 2014-02-05 NOTE — Progress Notes (Signed)
D   Pt has been pleasant and cooperative this evening   He attended group and after came to me to tell me the abcess in his mouth busted open and he said it felt better   He interacts appropriately with others   Pt said the doctor told him he would be discharged soon and he said he was ready for same A    Verbal support given   Medications administered and effectiveness monitored   Discussed discharge and follow up plans with pt and encouraged medication compliance to prevent frequent hospitalizations   Q 15 min checks R   Pt verbalized understanding and is safe at present

## 2014-02-05 NOTE — Progress Notes (Signed)
Patient ID: [name], male   DOB: Apr 01, 1981, 33 y.o.   MRN: [id number] [facility] MD Progress Note  02/05/2014 10:00 AM [name]  MRN:  [id number] Subjective:   Patient states "my mouth hurt bad, I have oral abscess, I don't think the antibiotics is working yet.'' Objective:  Patient is seen and chart is reviewed. Patient seems to be doing much better except for having pain from oral abscess. He is endorsing decreased depressive symptoms and anxiety. He is no longer showing signs or symptoms of Alcohol withdrawal. He denies craving from alcohol, suicidal thoughts and visual/auditory hallucinations. Patient is compliant with his medications and denies any adverse reactions.  Diagnosis:   DSM5: Total Time spent with patient: 20 minutes  Axis I: Major depressive disorder, recurrent episode           Alcohol abuse with alcohol-induced mood disorder  Axis II: Deferred Axis III:  Past Medical History  Diagnosis Date  . Seizures   . Hypertension   . Pancreatitis   . Liver cirrhosis   . Coronary artery disease   . Angina   . Shortness of breath   . Headache(784.0)   . Peripheral vascular disease April 2011    Left Pop  . Hypercholesteremia   . Cancer of kidney dx'd 04/2013    lt nephrectomy  . Breast CA dx'd 2009    bil w/ bil masectomy and oral meds   Axis IV: economic problems, occupational problems, other psychosocial or environmental problems and problems related to social environment Axis V: 50-60 moderate symptoms  ADL's:  Impaired  Sleep: Fair  Appetite:  Fair  Suicidal Ideation:  Denies Homicidal Ideation:  Denies AEB (as evidenced by):  Psychiatric Specialty Exam: Physical Exam  Psychiatric: His speech is normal and behavior is normal. Judgment and thought content normal. His mood appears anxious. Cognition and memory are normal.    Review of Systems  Constitutional: Negative for fever, chills, weight loss and malaise/fatigue.  HENT: Negative for ear discharge,  ear pain, hearing loss, nosebleeds and tinnitus.   Eyes: Negative for blurred vision, double vision, photophobia, pain and discharge.  Respiratory: Negative for cough, hemoptysis, sputum production and shortness of breath.   Cardiovascular: Negative for chest pain, palpitations, orthopnea, claudication and leg swelling.  Gastrointestinal: Negative for heartburn, vomiting, abdominal pain, diarrhea, constipation and blood in stool.  Genitourinary: Negative for dysuria, urgency, frequency, hematuria and flank pain.  Musculoskeletal: Negative for back pain, joint pain, myalgias and neck pain.  Skin: Negative for itching and rash.  Neurological: Negative for sensory change, speech change, focal weakness, seizures and loss of consciousness.  Endo/Heme/Allergies: Negative for environmental allergies and polydipsia. Does not bruise/bleed easily.  Psychiatric/Behavioral: Positive for depression. The patient is nervous/anxious.     Blood pressure 119/78, pulse 68, temperature 98.5 F (36.9 C), temperature source Oral, resp. rate 20, height 5\' 6"  (1.676 m), weight 76.204 kg (168 lb), SpO2 98.00%.Body mass index is 27.13 kg/(m^2).  General Appearance: fairly groomed  Engineer, water::  Minimal  Speech:  Slow  Volume:  Decreased  Mood:  Dysphoric  Affect:  neutral  Thought Process:  Coherent  Orientation:  Full (Time, Place, and Person)  Thought Content:  Reality based  Suicidal Thoughts:  No  Homicidal Thoughts:  No  Memory:  Immediate;   Fair Recent;   Fair Remote;   Fair  Judgement:  Fair  Insight:  Shallow  Psychomotor Activity:  Psychomotor Retardation  Concentration:  Fair  Recall:  fair  [employer]  of Knowledge: fair  Language: Fair  Akathisia:  No  Handed:  Right  AIMS (if indicated):     Assets:  Desire for Improvement Leisure Time Resilience  Sleep:  Number of Hours: 6.5   Musculoskeletal: Strength & Muscle Tone: within normal limits Gait & Station: normal Patient leans:  N/A  Current Medications: Current Facility-Administered Medications  Medication Dose Route Frequency Provider Last Rate Last Dose  . acamprosate (CAMPRAL) tablet 666 mg  666 mg Oral TID WC Neita Garnet, MD   666 mg at 02/05/14 0350  . alum & mag hydroxide-simeth (MAALOX/MYLANTA) 200-200-20 MG/5ML suspension 30 mL  30 mL Oral Q4H PRN Laverle Hobby, PA-C      . amLODipine (NORVASC) tablet 5 mg  5 mg Oral Daily Laverle Hobby, PA-C   5 mg at 02/05/14 0741  . ARIPiprazole (ABILIFY) tablet 10 mg  10 mg Oral QHS Neita Garnet, MD   10 mg at 02/04/14 2206  . atorvastatin (LIPITOR) tablet 20 mg  20 mg Oral q1800 Diego Ulbricht   20 mg at 02/04/14 1705  . azithromycin (ZITHROMAX) tablet 250 mg  250 mg Oral Daily Benjamine Mola, FNP   250 mg at 02/05/14 0740  . chlorhexidine (PERIDEX) 0.12 % solution 15 mL  15 mL Mouth/Throat QID Laverle Hobby, PA-C   15 mL at 02/05/14 0800  . dicyclomine (BENTYL) tablet 20 mg  20 mg Oral Q6H PRN Neita Garnet, MD   20 mg at 02/02/14 2022  . diphenhydrAMINE (BENADRYL) tablet 25 mg  25 mg Oral Q6H PRN Laverle Hobby, PA-C   25 mg at 01/29/14 1043  . DULoxetine (CYMBALTA) DR capsule 30 mg  30 mg Oral Daily Laverle Hobby, PA-C   30 mg at 02/05/14 0740  . gabapentin (NEURONTIN) capsule 200 mg  200 mg Oral TID Dennys Guin   200 mg at 02/05/14 0740  . magnesium hydroxide (MILK OF MAGNESIA) suspension 30 mL  30 mL Oral Daily PRN Laverle Hobby, PA-C      . metoprolol (LOPRESSOR) tablet 50 mg  50 mg Oral BID Laverle Hobby, PA-C   50 mg at 02/05/14 0741  . mirtazapine (REMERON SOL-TAB) disintegrating tablet 15 mg  15 mg Oral QHS Laverle Hobby, PA-C   15 mg at 02/04/14 2206  . multivitamin with minerals tablet 1 tablet  1 tablet Oral Daily Laverle Hobby, PA-C   1 tablet at 02/05/14 0741  . naproxen (NAPROSYN) tablet 500 mg  500 mg Oral BID PRN Laverle Hobby, PA-C   500 mg at 02/05/14 0900  . OLANZapine zydis (ZYPREXA) disintegrating tablet 5 mg  5 mg Oral  Q8H PRN Benjamine Mola, FNP   5 mg at 02/02/14 2106  . SUMAtriptan (IMITREX) tablet 25 mg  25 mg Oral Q2H PRN Benjamine Mola, FNP   25 mg at 02/03/14 2202  . thiamine (VITAMIN B-1) tablet 100 mg  100 mg Oral Daily Laverle Hobby, PA-C   100 mg at 02/05/14 0743  . traZODone (DESYREL) tablet 200 mg  200 mg Oral QHS Benjamine Mola, FNP   200 mg at 02/04/14 2206    Lab Results:  No results found for this or any previous visit (from the past 48 hour(s)).  Physical Findings: AIMS: Facial and Oral Movements Muscles of Facial Expression: None, normal Lips and Perioral Area: None, normal Jaw: None, normal Tongue: None, normal,Extremity Movements Upper (arms, wrists, hands, fingers): None, normal Lower (legs,  knees, ankles, toes): None, normal, Trunk Movements Neck, shoulders, hips: None, normal, Overall Severity Severity of abnormal movements (highest score from questions above): None, normal Incapacitation due to abnormal movements: None, normal [name] awareness of abnormal movements (rate only patient's report): No Awareness,    CIWA:  CIWA-Ar Total: 1 COWS:     Treatment Plan Summary: Daily contact with patient to assess and evaluate symptoms and progress in treatment Medication management  Plan:  1. Continue crisis management and stabilization.  2. Medication management:  -ContinueTrazodone 150 mg hs for insomnia -Continue Ativan taper to safety manage symptoms of alcohol withdrawal  -Continue Abilify 10mg  daily for delusion/psychosis. -Continue Gabapentin 200mg  po tid for anxiety/alcohol withdrawal. -Continue Cymbalta 30 mg daily for depression  3. Encouraged patient to attend groups and participate in group counseling sessions and activities.  4. Discharge plan in progress.  5. Continue current treatment plan.  6. Address health issues: Continue Norvasc 5 mg daily and Lopressor 50 mg BID for Hypertension   Medical Decision Making Problem Points:  Established problem,  improving (1), Review of last therapy session (1) and Review of psycho-social stressors (1) Data Points:  Review or order clinical lab tests (1) Review or order medicine tests (1) Review of medication regiment & side effects (2)  I certify that inpatient services furnished can reasonably be expected to improve the patient's condition.   Corena Pilgrim, MD 02/05/2014, 10:00 AM

## 2014-02-05 NOTE — Progress Notes (Signed)
Pt presents with flat affect. Pt reported decreased s/s of depression and SI/HI/AVH. Pt denies any withdrawal symptoms this morning. Pt c/o of pain d/t abscess. Facial swelling noted. MD made aware. Pt given prn meds as ordered for pain. Pt reported that he is seeking long-term treatment for substance abuse but in the same sentence stated that he needed to return home because he have a new apt he have to pay for. Pt compliant with taking meds and attending groups.  Medications administered as ordered per MD. Verbal support given. Pt encouraged to attend groups. 15 minute checks performed for safety.  Pt safety maintained at this time.

## 2014-02-05 NOTE — Tx Team (Signed)
  Interdisciplinary Treatment Plan Update   Date Reviewed:  02/05/2014  Time Reviewed:  8:31 AM  Progress in Treatment:   Attending groups: Yes Participating in groups: Yes Taking medication as prescribed: Yes  Tolerating medication: Yes Family/Significant other contact made: Yes  Patient understands diagnosis: Yes  Discussing patient identified problems/goals with staff: Yes Medical problems stabilized or resolved: Yes Denies suicidal/homicidal ideation: Yes Patient has not harmed self or others: Yes  For review of initial/current patient goals, please see plan of care.  Estimated Length of Stay:  D/C tomorrow  Reason for Continuation of Hospitalization: Medication stabilization  New Problems/Goals identified:  N/A  Discharge Plan or Barriers:   return home, follow up outpt  Pt states he will follow up with [facility]  Additional Comments:  "I feel much better. I'm not hallucinating, I'm not suicidal, I'm not homicidal, I'm sleeping well. My mouth and face just hurt so bad. I don't think the antibiotic is working and it has green/brown drainage after it kinda popped earlier today and now I have some chills. I feel fine except that. It really hurts and I think the swelling is worse. " Pt started on Campral yesterday.  Attendees:  Signature: Corena Pilgrim, MD 02/05/2014 8:31 AM   Signature: Ripley Fraise, LCSW 02/05/2014 8:31 AM  Signature: Elmarie Shiley, NP 02/05/2014 8:31 AM  Signature: Mayra Neer, RN 02/05/2014 8:31 AM  Signature: Darrol Angel, RN 02/05/2014 8:31 AM  Signature:  02/05/2014 8:31 AM  Signature:   02/05/2014 8:31 AM  Signature:    Signature:    Signature:    Signature:    Signature:    Signature:      Scribe for Treatment Team:   Ripley Fraise, LCSW  02/05/2014 8:31 AM

## 2014-02-05 NOTE — Progress Notes (Signed)
Adult Psychoeducational Group Note  Date:  02/05/2014 Time:  8:52 PM  Group Topic/Focus:  Wrap-Up Group:   The focus of this group is to help patients review their daily goal of treatment and discuss progress on daily workbooks.  Participation Level:  Minimal  Participation Quality:  Appropriate  Affect:  Flat  Cognitive:  Lacking  Insight: Limited  Engagement in Group:  Limited  Modes of Intervention:  Socialization and Support  Additional Comments:  Patient attended and participated in group tonight. He reports that today he talked with his SW about discharge tomorrow and outpatient services. Everything has been finalized. Today he attended his groups and went for meals.  Salley Scarlet [facility] 02/05/2014, 8:52 PM

## 2014-02-05 NOTE — Progress Notes (Signed)
D: Pt is flat in affect (brightens upon approach) and anxious in mood. Pt continues to have ongoing tooth pain in association with his two abscesses. Pt aware that he has reached his maximum dose of Naprosyn. On-call extender ordered Peridex. Pt is denying any SI/HI/AVH. No withdraw symptoms reported or observed from pt. Pt actively participates within the milieu. Pt verbalizes awareness of the indications of his QHS medications.  A: Writer administered scheduled and prn medications to pt. Continued support and availability as needed was extended to this pt. Staff continue to monitor pt with q23min checks.  R: No adverse drug reactions noted. Pt receptive to treatment. Pt remains safe at this time.

## 2014-02-05 NOTE — [facility] Group Notes (Signed)
[place] LCSW Group Therapy  02/05/2014 , 12:17 PM   Type of Therapy:  Group Therapy  Participation Level:  Active  Participation Quality:  Attentive  Affect:  Appropriate  Cognitive:  Alert  Insight:  Improving  Engagement in Therapy:  Engaged  Modes of Intervention:  Discussion, Exploration and Socialization  Summary of Progress/Problems: Today's group focused on the term Diagnosis.  Participants were asked to define the term, and then pronounce whether it is a negative, positive or neutral term.  As with yesterday, [name] was initially found in bed and reluctant to come to group, but with some encouragement he joined [place].  He talked about his diagnosis of alcohol dependence, and what a challenge it is for him.  He also admitted ambivalence when the group was made aware that he is hoping to return home rather than going directly to rehab from here.  The group encourage him to reconsider, and then agreed that he has to make his own decisions, and he ultimately knows what is best for him.  [name] 02/05/2014 , 12:17 PM

## 2014-02-06 DIAGNOSIS — F1020 Alcohol dependence, uncomplicated: Secondary | ICD-10-CM

## 2014-02-06 MED ORDER — DULOXETINE HCL 30 MG PO CPEP: 30.0000 mg | ORAL_CAPSULE | Freq: Every day | ORAL | Status: DC

## 2014-02-06 MED ORDER — SIMVASTATIN 40 MG PO TABS: 40.0000 mg | ORAL_TABLET | Freq: Every day | ORAL | Status: DC

## 2014-02-06 MED ORDER — CLINDAMYCIN HCL 300 MG PO CAPS: 300.0000 mg | ORAL_CAPSULE | Freq: Four times a day (QID) | ORAL | Status: DC

## 2014-02-06 MED ORDER — SUMATRIPTAN SUCCINATE 25 MG PO TABS: 25.0000 mg | ORAL_TABLET | ORAL | Status: DC | PRN

## 2014-02-06 MED ORDER — ONDANSETRON HCL 4 MG PO TABS: 4.0000 mg | ORAL_TABLET | Freq: Four times a day (QID) | ORAL | Status: DC

## 2014-02-06 MED ORDER — MECLIZINE HCL 25 MG PO TABS: 25.0000 mg | ORAL_TABLET | Freq: Three times a day (TID) | ORAL | Status: DC | PRN

## 2014-02-06 MED ORDER — ACAMPROSATE CALCIUM 333 MG PO TBEC: 666.0000 mg | DELAYED_RELEASE_TABLET | Freq: Three times a day (TID) | ORAL | Status: DC

## 2014-02-06 MED ORDER — GABAPENTIN 100 MG PO CAPS: 200.0000 mg | ORAL_CAPSULE | Freq: Three times a day (TID) | ORAL | Status: DC

## 2014-02-06 MED ORDER — METOPROLOL TARTRATE 50 MG PO TABS
50.0000 mg | ORAL_TABLET | Freq: Two times a day (BID) | ORAL | Status: DC
Start: 2014-02-06 — End: 2014-04-18

## 2014-02-06 MED ORDER — ARIPIPRAZOLE 10 MG PO TABS: 10.0000 mg | ORAL_TABLET | Freq: Every day | ORAL | Status: DC

## 2014-02-06 MED ORDER — MIRTAZAPINE 15 MG PO TBDP: 15.0000 mg | ORAL_TABLET | Freq: Every day | ORAL | Status: DC

## 2014-02-06 MED ORDER — AMLODIPINE BESYLATE 5 MG PO TABS: 5.0000 mg | ORAL_TABLET | Freq: Every day | ORAL | Status: DC

## 2014-02-06 MED ORDER — TRAZODONE HCL 100 MG PO TABS: 200.0000 mg | ORAL_TABLET | Freq: Every day | ORAL | Status: DC

## 2014-02-06 NOTE — Progress Notes (Signed)
Pt received both written and verbal discharge instructions. Pt agreed to f/u appt and med regimen. Pt verbalized understanding of discharge instructions. Pt denied SI/HI/AVH/Depression/Withdrawal s/s. Pt received sample meds, prescriptions and belongings. Pt picked up by his mother and safely left [facility].

## 2014-02-06 NOTE — Progress Notes (Signed)
[facility] Adult Case Management Discharge Plan :  Will you be returning to the same living situation after discharge: Yes,  home At discharge, do you have transportation home?:Yes,  mother Do you have the ability to pay for your medications:Yes,  mental health  Release of information consent forms completed and in the chart;  [name] signature needed at discharge.  Patient to Follow up at: Follow-up Information   Follow up with [facility] On 02/08/2014. (Friday at 10:00)    Contact information:   [place] [phone]      Follow up with [facility]. (Call them at [phone] to follow up on your inpatient rehabilitation referral)       Patient denies SI/HI:   Yes,  yes    Safety Planning and Suicide Prevention discussed:  Yes,  yes  [name] 02/06/2014, 9:52 AM

## 2014-02-06 NOTE — [facility] Suicide Risk Assessment (Signed)
[facility] INPATIENT:  Family/Significant Other Suicide Prevention Education  Suicide Prevention Education:  Education Completed; [name], mother, [phone] has been identified by the patient as the family member/significant other with whom the patient will be residing, and identified as the person(s) who will aid the patient in the event of a mental health crisis (suicidal ideations/suicide attempt).  With written consent from the patient, the family member/significant other has been provided the following suicide prevention education, prior to the and/or following the discharge of the patient.  The suicide prevention education provided includes the following:  Suicide risk factors  Suicide prevention and interventions  National Suicide Hotline telephone number  [facility] assessment telephone number  [facility] Emergency Assistance [place] and/or Residential Mobile Crisis Unit telephone number  Request made of family/significant other to:  Remove weapons (e.g., guns, rifles, knives), all items previously/currently identified as safety concern.    Remove drugs/medications (over-the-counter, prescriptions, illicit drugs), all items previously/currently identified as a safety concern.  The family member/significant other verbalizes understanding of the suicide prevention education information provided.  The family member/significant other agrees to remove the items of safety concern listed above.  [name] B 02/06/2014, 9:49 AM

## 2014-02-06 NOTE — [facility] Suicide Risk Assessment (Signed)
Demographic Factors:  Male, Low socioeconomic status and Unemployed  Total Time spent with patient: 20 minutes  Psychiatric Specialty Exam: Physical Exam  Psychiatric: He has a normal mood and affect. His speech is normal and behavior is normal. Judgment and thought content normal. Cognition and memory are normal.    Review of Systems  Constitutional: Negative.   HENT: Negative.   Eyes: Negative.   Respiratory: Negative.   Cardiovascular: Negative.   Gastrointestinal: Negative.   Genitourinary: Negative.   Musculoskeletal: Negative.   Skin: Negative.   Neurological: Negative.   Endo/Heme/Allergies: Negative.   Psychiatric/Behavioral: Negative.     Blood pressure 124/83, pulse 77, temperature 97.9 F (36.6 C), temperature source Oral, resp. rate 20, height 5\' 6"  (1.676 m), weight 76.204 kg (168 lb), SpO2 98.00%.Body mass index is 27.13 kg/(m^2).  General Appearance: Negative  Eye Contact::  Good  Speech:  Clear and Coherent  Volume:  Normal  Mood:  Euthymic  Affect:  Appropriate  Thought Process:  Goal Directed  Orientation:  Full (Time, Place, and Person)  Thought Content:  Negative  Suicidal Thoughts:  No  Homicidal Thoughts:  No  Memory:  Immediate;   Fair Recent;   Fair Remote;   Fair  Judgement:  marginal  Insight:  Fair  Psychomotor Activity:  Normal  Concentration:  Fair  Recall:  [employer] of Knowledge:Fair  Language: Good  Akathisia:  No  Handed:  Right  AIMS (if indicated):     Assets:  Communication Skills Desire for Improvement Physical Health  Sleep:  Number of Hours: 6.75    Musculoskeletal: Strength & Muscle Tone: within normal limits Gait & Station: normal Patient leans: N/A   Mental Status Per Nursing Assessment::   On Admission:  NA  Current Mental Status by Physician: patient denies suicidal ideation, intent or plan  Loss Factors: Financial problems/change in socioeconomic status  Historical Factors: NA  Risk Reduction  Factors:   Sense of responsibility to family  Continued Clinical Symptoms:  Alcohol/Substance Abuse/Dependencies  Cognitive Features That Contribute To Risk:  Closed-mindedness    Suicide Risk:  Minimal: No identifiable suicidal ideation.  Patients presenting with no risk factors but with morbid ruminations; may be classified as minimal risk based on the severity of the depressive symptoms  Discharge Diagnoses:   AXIS I:  Major depressive disorder, recurrent episode              Alcohol dependence AXIS II:  Deferred AXIS III:   Past Medical History  Diagnosis Date  . Seizures   . Hypertension   . Pancreatitis   . Liver cirrhosis   . Coronary artery disease   . Angina   . Shortness of breath   . Headache(784.0)   . Peripheral vascular disease April 2011    Left Pop  . Hypercholesteremia   . Cancer of kidney dx'd 04/2013    lt nephrectomy  . Breast CA dx'd 2009    bil w/ bil masectomy and oral meds   AXIS IV:  other psychosocial or environmental problems and problems related to social environment AXIS V:  61-70 mild symptoms  Plan Of Care/Follow-up recommendations:  Activity:  as tolerated Diet:  healthy Tests:  routine Other:  patient to keep his after care appointment  Is patient on multiple antipsychotic therapies at discharge:  No   Has Patient had three or more failed trials of antipsychotic monotherapy by history:  No  Recommended Plan for Multiple Antipsychotic Therapies: NA  Corena Pilgrim, MD 02/06/2014, 9:13 AM

## 2014-02-07 ENCOUNTER — Ambulatory Visit: Payer: Self-pay | Admitting: Family

## 2014-02-11 NOTE — Progress Notes (Signed)
Patient Discharge Instructions:  After Visit Summary (AVS):   Faxed to:  02/11/14 Discharge Summary Note:   Faxed to:  02/11/14 Psychiatric Admission Assessment Note:   Faxed to:  02/11/14 Suicide Risk Assessment - Discharge Assessment:   Faxed to:  02/11/14 Faxed/Sent to the Next Level Care provider:  02/11/14 Faxed to [place] @ [phone] Faxed to [place] @ [place], 02/11/2014, 2:06 PM

## 2014-04-03 ENCOUNTER — Encounter (HOSPITAL_COMMUNITY): Payer: Self-pay | Admitting: Emergency Medicine

## 2014-04-03 ENCOUNTER — Emergency Department (HOSPITAL_COMMUNITY)
Admission: EM | Admit: 2014-04-03 | Discharge: 2014-04-04 | Disposition: A | Discharge status: Home / Self Care | Payer: Self-pay | Attending: Emergency Medicine | Admitting: Emergency Medicine

## 2014-04-03 DIAGNOSIS — F1020 Alcohol dependence, uncomplicated: Secondary | ICD-10-CM | POA: Insufficient documentation

## 2014-04-03 DIAGNOSIS — Z8719 Personal history of other diseases of the digestive system: Secondary | ICD-10-CM | POA: Insufficient documentation

## 2014-04-03 DIAGNOSIS — F10920 Alcohol use, unspecified with intoxication, uncomplicated: Secondary | ICD-10-CM

## 2014-04-03 DIAGNOSIS — F17200 Nicotine dependence, unspecified, uncomplicated: Secondary | ICD-10-CM | POA: Insufficient documentation

## 2014-04-03 DIAGNOSIS — I209 Angina pectoris, unspecified: Secondary | ICD-10-CM | POA: Insufficient documentation

## 2014-04-03 DIAGNOSIS — Z85528 Personal history of other malignant neoplasm of kidney: Secondary | ICD-10-CM | POA: Insufficient documentation

## 2014-04-03 DIAGNOSIS — Z853 Personal history of malignant neoplasm of breast: Secondary | ICD-10-CM | POA: Insufficient documentation

## 2014-04-03 DIAGNOSIS — F1014 Alcohol abuse with alcohol-induced mood disorder: Secondary | ICD-10-CM

## 2014-04-03 DIAGNOSIS — E7800 Pure hypercholesterolemia, unspecified: Secondary | ICD-10-CM | POA: Insufficient documentation

## 2014-04-03 DIAGNOSIS — I10 Essential (primary) hypertension: Secondary | ICD-10-CM | POA: Insufficient documentation

## 2014-04-03 DIAGNOSIS — F10220 Alcohol dependence with intoxication, uncomplicated: Secondary | ICD-10-CM

## 2014-04-03 DIAGNOSIS — Z79899 Other long term (current) drug therapy: Secondary | ICD-10-CM | POA: Insufficient documentation

## 2014-04-03 DIAGNOSIS — F1310 Sedative, hypnotic or anxiolytic abuse, uncomplicated: Secondary | ICD-10-CM | POA: Insufficient documentation

## 2014-04-03 DIAGNOSIS — Z880 Allergy status to penicillin: Secondary | ICD-10-CM | POA: Insufficient documentation

## 2014-04-03 DIAGNOSIS — F319 Bipolar disorder, unspecified: Secondary | ICD-10-CM | POA: Insufficient documentation

## 2014-04-03 DIAGNOSIS — F209 Schizophrenia, unspecified: Secondary | ICD-10-CM | POA: Insufficient documentation

## 2014-04-03 DIAGNOSIS — F1010 Alcohol abuse, uncomplicated: Secondary | ICD-10-CM | POA: Insufficient documentation

## 2014-04-03 DIAGNOSIS — F10988 Alcohol use, unspecified with other alcohol-induced disorder: Secondary | ICD-10-CM | POA: Insufficient documentation

## 2014-04-03 DIAGNOSIS — I2510 Atherosclerotic heart disease of native coronary artery without angina pectoris: Secondary | ICD-10-CM | POA: Insufficient documentation

## 2014-04-03 LAB — CBC
HCT: 45.6 % (ref 39.0–52.0)
Hemoglobin: 16 g/dL (ref 13.0–17.0)
MCH: 31.4 pg (ref 26.0–34.0)
MCHC: 35.1 g/dL (ref 30.0–36.0)
MCV: 89.6 fL (ref 78.0–100.0)
PLATELETS: 121 10*3/uL — AB (ref 150–400)
RBC: 5.09 MIL/uL (ref 4.22–5.81)
RDW: 15.2 % (ref 11.5–15.5)
WBC: 4.7 10*3/uL (ref 4.0–10.5)

## 2014-04-03 LAB — RAPID URINE DRUG SCREEN, HOSP PERFORMED
AMPHETAMINES: NOT DETECTED
BENZODIAZEPINES: POSITIVE — AB
Barbiturates: NOT DETECTED
Cocaine: NOT DETECTED
Opiates: NOT DETECTED
Tetrahydrocannabinol: NOT DETECTED

## 2014-04-03 MED ORDER — ONDANSETRON HCL 4 MG/2ML IJ SOLN
4.0000 mg | Freq: Once | INTRAMUSCULAR | Status: AC
  Administered 2014-04-03: 4 mg via INTRAVENOUS
  Filled 2014-04-03: qty 2

## 2014-04-03 MED ORDER — THIAMINE HCL 100 MG/ML IJ SOLN
Freq: Once | INTRAVENOUS | Status: AC
  Administered 2014-04-04: via INTRAVENOUS
  Filled 2014-04-03: qty 1000

## 2014-04-03 MED ORDER — SODIUM CHLORIDE 0.9 % IV BOLUS (SEPSIS)
1000.0000 mL | Freq: Once | INTRAVENOUS | Status: AC
  Administered 2014-04-03: 1000 mL via INTRAVENOUS
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
## 2014-04-04 ENCOUNTER — Encounter (HOSPITAL_COMMUNITY): Payer: Self-pay | Admitting: Registered Nurse

## 2014-04-04 DIAGNOSIS — F1010 Alcohol abuse, uncomplicated: Secondary | ICD-10-CM

## 2014-04-04 DIAGNOSIS — F1994 Other psychoactive substance use, unspecified with psychoactive substance-induced mood disorder: Secondary | ICD-10-CM

## 2014-04-04 LAB — COMPREHENSIVE METABOLIC PANEL
ALK PHOS: 71 U/L (ref 39–117)
ALT: 173 U/L — ABNORMAL HIGH (ref 0–53)
AST: 249 U/L — AB (ref 0–37)
Albumin: 4.2 g/dL (ref 3.5–5.2)
Anion gap: 20 — ABNORMAL HIGH (ref 5–15)
BUN: 10 mg/dL (ref 6–23)
CALCIUM: 9.3 mg/dL (ref 8.4–10.5)
CO2: 22 mEq/L (ref 19–32)
Chloride: 97 mEq/L (ref 96–112)
Creatinine, Ser: 0.88 mg/dL (ref 0.50–1.35)
GFR calc Af Amer: >90 mL/min (ref >90)
GFR calc non Af Amer: >90 mL/min (ref >90)
Glucose, Bld: 75 mg/dL (ref 70–99)
Potassium: 4.2 mEq/L (ref 3.7–5.3)
SODIUM: 139 meq/L (ref 137–147)
Total Bilirubin: 0.4 mg/dL (ref 0.3–1.2)
Total Protein: 8.3 g/dL (ref 6.0–8.3)

## 2014-04-04 LAB — ETHANOL
Alcohol, Ethyl (B): 204 mg/dL — ABNORMAL HIGH (ref 0–11)
Alcohol, Ethyl (B): 365 mg/dL — ABNORMAL HIGH (ref 0–11)

## 2014-04-04 LAB — SALICYLATE LEVEL

## 2014-04-04 LAB — ACETAMINOPHEN LEVEL

## 2014-04-04 MED ORDER — DIPHENHYDRAMINE HCL 12.5 MG/5ML PO ELIX
6.2500 mg | ORAL_SOLUTION | Freq: Once | ORAL | Status: AC
  Administered 2014-04-04: 6.25 mg via ORAL
  Filled 2014-04-04: qty 5

## 2014-04-04 MED ORDER — ONDANSETRON 8 MG PO TBDP
8.0000 mg | ORAL_TABLET | Freq: Once | ORAL | Status: AC
  Administered 2014-04-04: 8 mg via ORAL
  Filled 2014-04-04: qty 2

## 2014-04-04 NOTE — [facility] Suicide Risk Assessment (Cosign Needed)
Suicide Risk Assessment  Discharge Assessment     Demographic Factors:  Male and Black  Total Time spent with patient: 30 minutes  Psychiatric Specialty Exam:     Blood pressure 127/72, pulse 61, temperature 97.4 F (36.3 C), temperature source Axillary, resp. rate 18, SpO2 96.00%.There is no weight on file to calculate BMI.   General Appearance: Casual   Eye Contact:: Good   Speech: Clear and Coherent and Normal Rate   Volume: Normal   Mood: "I'm good; I'm just ready to go"   Affect: Congruent   Thought Process: Circumstantial   Orientation: Full (Time, Place, and Person)   Thought Content: Rumination   Suicidal Thoughts: No   Homicidal Thoughts: No   Memory: Immediate; Good  Recent; Good  Remote; Good   Judgement: Fair   Insight: Present   Psychomotor Activity: Normal   Concentration: Fair   Recall: Good   Fund of Knowledge:Good   Language: Good   Akathisia: No   Handed: Right   AIMS (if indicated):   Assets: Communication Skills  Desire for Improvement  Housing  Social Support  Transportation   Sleep:   Musculoskeletal:  Strength & Muscle Tone: within normal limits  Gait & Station: normal  Patient leans: N/A  Mental Status Per Nursing Assessment::   On Admission:     Current Mental Status by Physician: Patient denies suicidal/homicidal ideation, psychosis, and paranoia  Loss Factors: NA  Historical Factors: NA  Risk Reduction Factors:   Sense of responsibility to family and Positive social support  Continued Clinical Symptoms:  Alcohol/Substance Abuse/Dependencies  Cognitive Features That Contribute To Risk:  None noted    Suicide Risk:  Minimal: No identifiable suicidal ideation.  Patients presenting with no risk factors but with morbid ruminations; may be classified as minimal risk based on the severity of the depressive symptoms  Discharge Diagnoses: AXIS I: Alcohol Abuse and Substance Induced Mood Disorder  AXIS II: Deferred  AXIS  III:  Past Medical History   Diagnosis  Date   .  Seizures    .  Hypertension    .  Depression    .  Pancreatitis    .  Liver cirrhosis    .  Coronary artery disease    .  Angina    .  Shortness of breath    .  Headache(784.0)    .  Peripheral vascular disease  April 2011     Left Pop   .  Hypercholesteremia    .  Schizophrenia    .  Bipolar 1 disorder    .  Cancer of kidney  dx'd 04/2013     lt nephrectomy   .  Breast CA  dx'd 2009     bil w/ bil masectomy and oral meds    AXIS IV: other psychosocial or environmental problems  AXIS V: 61-70 mild symptoms  Plan Of Care/Follow-up recommendations:  Activity:  as tolerated Diet:  as tolerated  Is patient on multiple antipsychotic therapies at discharge:  No   Has Patient had three or more failed trials of antipsychotic monotherapy by history:  No  Recommended Plan for Multiple Antipsychotic Therapies: NA    Zenita Kister, FNP-BC 04/04/2014, 10:50 AM

## 2014-04-04 NOTE — ED Notes (Signed)
Paperwork was sent and pt is IVC'd.

## 2014-04-04 NOTE — ED Notes (Signed)
EDP made aware pt is ready to go home, Dr. Randal Buba states he cannot go d/t being SI, states pt is going to be IVC'd.

## 2014-04-04 NOTE — ED Notes (Signed)
Pt c/o chest pain 

## 2014-04-04 NOTE — Consult Note (Signed)
[facility] Face-to-Face Psychiatry Consult   Reason for Consult:  Tried to jump out of car Referring Physician:  EDP  [name] is an 33 y.o. male. Total Time spent with patient: 30 minutes  Assessment: AXIS I:  Alcohol Abuse and Substance Induced Mood Disorder AXIS II:  Deferred AXIS III:   Past Medical History  Diagnosis Date  . Seizures   . Hypertension   . Depression   . Pancreatitis   . Liver cirrhosis   . Coronary artery disease   . Angina   . Shortness of breath   . Headache(784.0)   . Peripheral vascular disease April 2011    Left Pop  . Hypercholesteremia   . Schizophrenia   . Bipolar 1 disorder   . Cancer of kidney dx'd 04/2013    lt nephrectomy  . Breast CA dx'd 2009    bil w/ bil masectomy and oral meds   AXIS IV:  other psychosocial or environmental problems AXIS V:  61-70 mild symptoms  Plan:  No evidence of imminent risk to self or others at present.   Patient does not meet criteria for psychiatric inpatient admission. Supportive therapy provided about ongoing stressors. Discussed crisis plan, support from social network, calling 911, coming to the Emergency Department, and calling Suicide Hotline.  Subjective:   [name] is a 33 y.o. male patient.  HPI:  Patient states that he was intoxicated and his mother doesn't like him drinking; got him in the car and then told him she was taking him to the hospital.  "I tried to get out of the car cause I didn't want to go to the hospital. I wasn't trying to kill myself.  My mom doesn't want me to drink at all."  Patient denies suicidal/homicidal ideation, psychosis, and paranoia.  Patient states that he is not seeking alcohol detox or rehab services.    HPI Elements:   Location:  Alcohol abuse. Quality:  alcohol intoxication. Severity:  trying to get out of moving car. Timing:  1 day. Review of Systems  Constitutional: Negative for diaphoresis.  HENT: Negative.   Respiratory: Negative.   Gastrointestinal:  Positive for nausea. Negative for abdominal pain, diarrhea and constipation.  Musculoskeletal: Negative.   Neurological: Negative for tremors, seizures and loss of consciousness.  Psychiatric/Behavioral: Positive for substance abuse. Negative for depression, suicidal ideas, hallucinations and memory loss. The patient is not nervous/anxious and does not have insomnia.   All other systems reviewed and are negative.  Family History  Problem Relation Age of Onset  . Stroke Other     Past Psychiatric History: Past Medical History  Diagnosis Date  . Seizures   . Hypertension   . Depression   . Pancreatitis   . Liver cirrhosis   . Coronary artery disease   . Angina   . Shortness of breath   . Headache(784.0)   . Peripheral vascular disease April 2011    Left Pop  . Hypercholesteremia   . Schizophrenia   . Bipolar 1 disorder   . Cancer of kidney dx'd 04/2013    lt nephrectomy  . Breast CA dx'd 2009    bil w/ bil masectomy and oral meds    reports that he has been smoking Cigarettes.  He has a .4 pack-year smoking history. He has never used smokeless tobacco. His alcohol and drug histories are not on file. Family History  Problem Relation Age of Onset  . Stroke Other  Allergies:   Allergies  Allergen Reactions  . Codeine Hives, Itching and Swelling  . Depakote [Divalproex Sodium] Other (See Comments)    "Bug out and hallucinate"  . Morphine Itching  . Penicillins Swelling  . Hydrocodone-Acetaminophen Itching and Rash    ACT Assessment Complete:  Yes:    Educational Status    Risk to Self: Risk to self with the past 6 months Is patient at risk for suicide?: Yes Substance abuse history and/or treatment for substance abuse?: Yes  Risk to Others:    Abuse:    Prior Inpatient Therapy:    Prior Outpatient Therapy:    Additional Information:          Objective: Blood pressure 127/72, pulse 61, temperature 97.4 F (36.3 C), temperature source Axillary,  resp. rate 18, SpO2 96.00%.There is no weight on file to calculate BMI. Results for orders placed during the hospital encounter of 04/03/14 (from the past 72 hour(s))  URINE RAPID DRUG SCREEN (HOSP PERFORMED)     Status: Abnormal   Collection Time    04/03/14 11:17 PM      Result Value Ref Range   Opiates NONE DETECTED  NONE DETECTED   Cocaine NONE DETECTED  NONE DETECTED   Benzodiazepines POSITIVE (*) NONE DETECTED   Amphetamines NONE DETECTED  NONE DETECTED   Tetrahydrocannabinol NONE DETECTED  NONE DETECTED   Barbiturates NONE DETECTED  NONE DETECTED   Comment:            DRUG SCREEN FOR MEDICAL PURPOSES     ONLY.  IF CONFIRMATION IS NEEDED     FOR ANY PURPOSE, NOTIFY LAB     WITHIN 5 DAYS.                LOWEST DETECTABLE LIMITS     FOR URINE DRUG SCREEN     Drug Class       Cutoff (ng/mL)     Amphetamine      1000     Barbiturate      200     Benzodiazepine   341     Tricyclics       962     Opiates          300     Cocaine          300     THC              50  CBC     Status: Abnormal   Collection Time    04/03/14 11:30 PM      Result Value Ref Range   WBC 4.7  4.0 - 10.5 K/uL   RBC 5.09  4.22 - 5.81 MIL/uL   Hemoglobin 16.0  13.0 - 17.0 g/dL   HCT 45.6  39.0 - 52.0 %   MCV 89.6  78.0 - 100.0 fL   MCH 31.4  26.0 - 34.0 pg   MCHC 35.1  30.0 - 36.0 g/dL   RDW 15.2  11.5 - 15.5 %   Platelets 121 (*) 150 - 400 K/uL  COMPREHENSIVE METABOLIC PANEL     Status: Abnormal   Collection Time    04/03/14 11:30 PM      Result Value Ref Range   Sodium 139  137 - 147 mEq/L   Potassium 4.2  3.7 - 5.3 mEq/L   Chloride 97  96 - 112 mEq/L   CO2 22  19 - 32 mEq/L   Glucose, Bld 75  70 - 99  mg/dL   BUN 10  6 - 23 mg/dL   Creatinine, Ser 0.88  0.50 - 1.35 mg/dL   Calcium 9.3  8.4 - 10.5 mg/dL   Total Protein 8.3  6.0 - 8.3 g/dL   Albumin 4.2  3.5 - 5.2 g/dL   AST 249 (*) 0 - 37 U/L   ALT 173 (*) 0 - 53 U/L   Alkaline Phosphatase 71  39 - 117 U/L   Total Bilirubin 0.4  0.3 -  1.2 mg/dL   GFR calc non Af Amer >90  >90 mL/min   GFR calc Af Amer >90  >90 mL/min   Comment: (NOTE)     The eGFR has been calculated using the CKD EPI equation.     This calculation has not been validated in all clinical situations.     eGFR's persistently <90 mL/min signify possible Chronic Kidney     Disease.   Anion gap 20 (*) 5 - 15  ETHANOL     Status: Abnormal   Collection Time    04/03/14 11:30 PM      Result Value Ref Range   Alcohol, Ethyl (B) 365 (*) 0 - 11 mg/dL   Comment:            LOWEST DETECTABLE LIMIT FOR     SERUM ALCOHOL IS 11 mg/dL     FOR MEDICAL PURPOSES ONLY  ACETAMINOPHEN LEVEL     Status: None   Collection Time    04/03/14 11:30 PM      Result Value Ref Range   Acetaminophen (Tylenol), Serum <15.0  10 - 30 ug/mL   Comment:            THERAPEUTIC CONCENTRATIONS VARY     SIGNIFICANTLY. A RANGE OF 10-30     ug/mL MAY BE AN EFFECTIVE     CONCENTRATION FOR MANY PATIENTS.     HOWEVER, SOME ARE BEST TREATED     AT CONCENTRATIONS OUTSIDE THIS     RANGE.     ACETAMINOPHEN CONCENTRATIONS     >150 ug/mL AT 4 HOURS AFTER     INGESTION AND >50 ug/mL AT 12     HOURS AFTER INGESTION ARE     OFTEN ASSOCIATED WITH TOXIC     REACTIONS.  SALICYLATE LEVEL     Status: Abnormal   Collection Time    04/03/14 11:30 PM      Result Value Ref Range   Salicylate Lvl <3.5 (*) 2.8 - 20.0 mg/dL  ETHANOL     Status: Abnormal   Collection Time    04/04/14  6:25 AM      Result Value Ref Range   Alcohol, Ethyl (B) 204 (*) 0 - 11 mg/dL   Comment:            LOWEST DETECTABLE LIMIT FOR     SERUM ALCOHOL IS 11 mg/dL     FOR MEDICAL PURPOSES ONLY     SLIGHT HEMOLYSIS     HEMOLYSIS AT THIS LEVEL MAY AFFECT RESULT   Labs are reviewed see values above.  Medications reviewed and no changes made  No current facility-administered medications for this encounter.   Current Outpatient Prescriptions  Medication Sig Dispense Refill  . acamprosate (CAMPRAL) 333 MG tablet Take 2  tablets (666 mg total) by mouth 3 (three) times daily with meals.  180 tablet  0  . amLODipine (NORVASC) 5 MG tablet Take 1 tablet (5 mg total) by mouth daily.  30 tablet  0  .  ARIPiprazole (ABILIFY) 10 MG tablet Take 1 tablet (10 mg total) by mouth at bedtime.  30 tablet  0  . DULoxetine (CYMBALTA) 30 MG capsule Take 1 capsule (30 mg total) by mouth daily.  30 capsule  0  . gabapentin (NEURONTIN) 100 MG capsule Take 2 capsules (200 mg total) by mouth 3 (three) times daily.  180 capsule  0  . metoprolol (LOPRESSOR) 50 MG tablet Take 1 tablet (50 mg total) by mouth 2 (two) times daily.  60 tablet  0  . mirtazapine (REMERON SOL-TAB) 15 MG disintegrating tablet Take 1 tablet (15 mg total) by mouth at bedtime.  30 tablet  0  . simvastatin (ZOCOR) 40 MG tablet Take 1 tablet (40 mg total) by mouth daily.  30 tablet    . SUMAtriptan (IMITREX) 25 MG tablet Take 1 tablet (25 mg total) by mouth every 2 (two) hours as needed for migraine or headache. May repeat in 2 hours if headache persists or recurs.  10 tablet  0  . traZODone (DESYREL) 100 MG tablet Take 2 tablets (200 mg total) by mouth at bedtime.  60 tablet  0    Psychiatric Specialty Exam:     Blood pressure 127/72, pulse 61, temperature 97.4 F (36.3 C), temperature source Axillary, resp. rate 18, SpO2 96.00%.There is no weight on file to calculate BMI.  General Appearance: Casual  Eye Contact::  Good  Speech:  Clear and Coherent and Normal Rate  Volume:  Normal  Mood:  "I'm good; I'm just ready to go"  Affect:  Congruent  Thought Process:  Circumstantial  Orientation:  Full (Time, Place, and Person)  Thought Content:  Rumination  Suicidal Thoughts:  No  Homicidal Thoughts:  No  Memory:  Immediate;   Good Recent;   Good Remote;   Good  Judgement:  Fair  Insight:  Present  Psychomotor Activity:  Normal  Concentration:  Fair  Recall:  Good  Fund of Knowledge:Good  Language: Good  Akathisia:  No  Handed:  Right  AIMS (if  indicated):     Assets:  Communication Skills Desire for Improvement Housing Social Support Transportation  Sleep:      Musculoskeletal: Strength & Muscle Tone: within normal limits Gait & Station: normal Patient leans: N/A  Treatment Plan Summary: Discharge home with resources for substance abuse rehab and outpatient services  Earleen Newport, FNP-BC 04/04/2014 10:41 AM

## 2014-04-04 NOTE — Consult Note (Signed)
Face to face evaluation and I agree with this note 

## 2014-04-04 NOTE — Discharge Instructions (Signed)
Alcohol Intoxication °Alcohol intoxication occurs when the amount of alcohol that a person has consumed impairs his or her ability to mentally and physically function. Alcohol directly impairs the normal chemical activity of the brain. Drinking large amounts of alcohol can lead to changes in mental function and behavior, and it can cause many physical effects that can be harmful.  °Alcohol intoxication can range in severity from mild to very severe. Various factors can affect the level of intoxication that occurs, such as the person's age, gender, weight, frequency of alcohol consumption, and the presence of other medical conditions (such as diabetes, seizures, or heart conditions). Dangerous levels of alcohol intoxication may occur when people drink large amounts of alcohol in a short period (binge drinking). Alcohol can also be especially dangerous when combined with certain prescription medicines or "recreational" drugs. °SIGNS AND SYMPTOMS °Some common signs and symptoms of mild alcohol intoxication include: °· Loss of coordination. °· Changes in mood and behavior. °· Impaired judgment. °· Slurred speech. °As alcohol intoxication progresses to more severe levels, other signs and symptoms will appear. These may include: °· Vomiting. °· Confusion and impaired memory. °· Slowed breathing. °· Seizures. °· Loss of consciousness. °DIAGNOSIS  °Your health care provider will take a medical history and perform a physical exam. You will be asked about the amount and type of alcohol you have consumed. Blood tests will be done to measure the concentration of alcohol in your blood. In many places, your blood alcohol level must be lower than 80 mg/dL (0.08%) to legally drive. However, many dangerous effects of alcohol can occur at much lower levels.  °TREATMENT  °People with alcohol intoxication often do not require treatment. Most of the effects of alcohol intoxication are temporary, and they go away as the alcohol naturally  leaves the body. Your health care provider will monitor your condition until you are stable enough to go home. Fluids are sometimes given through an IV access tube to help prevent dehydration.  °HOME CARE INSTRUCTIONS °· Do not drive after drinking alcohol. °· Stay hydrated. Drink enough water and fluids to keep your urine clear or pale yellow. Avoid caffeine.   °· Only take over-the-counter or prescription medicines as directed by your health care provider.   °SEEK MEDICAL CARE IF:  °· You have persistent vomiting.   °· You do not feel better after a few days. °· You have frequent alcohol intoxication. Your health care provider can help determine if you should see a substance use treatment counselor. °SEEK IMMEDIATE MEDICAL CARE IF:  °· You become shaky or tremble when you try to stop drinking.   °· You shake uncontrollably (seizure).   °· You throw up (vomit) blood. This may be bright red or may look like black coffee grounds.   °· You have blood in your stool. This may be bright red or may appear as a black, tarry, bad smelling stool.   °· You become lightheaded or faint.   °MAKE SURE YOU:  °· Understand these instructions. °· Will watch your condition. °· Will get help right away if you are not doing well or get worse. °Document Released: 04/14/2005 Document Revised: 03/07/2013 Document Reviewed: 12/08/2012 °[employer]® Patient Information ©2015 [employer]. This information is not intended to replace advice given to you by your health care provider. Make sure you discuss any questions you have with your health care provider. ° °Alcohol Use Disorder °Alcohol use disorder is a mental disorder. It is not a one-time incident of heavy drinking. Alcohol use disorder is the excessive and   uncontrollable use of alcohol over time that leads to problems with functioning in one or more areas of daily living. People with this disorder risk harming themselves and others when they drink to excess. Alcohol use disorder also  can cause other mental disorders, such as mood and anxiety disorders, and serious physical problems. People with alcohol use disorder often misuse other drugs.  Alcohol use disorder is common and widespread. Some people with this disorder drink alcohol to cope with or escape from negative life events. Others drink to relieve chronic pain or symptoms of mental illness. People with a family history of alcohol use disorder are at higher risk of losing control and using alcohol to excess.  SYMPTOMS  Signs and symptoms of alcohol use disorder may include the following:   Consumption ofalcohol inlarger amounts or over a longer period of time than intended.  Multiple unsuccessful attempts to cutdown or control alcohol use.   A great deal of time spent obtaining alcohol, using alcohol, or recovering from the effects of alcohol (hangover).  A strong desire or urge to use alcohol (cravings).   Continued use of alcohol despite problems at work, school, or home because of alcohol use.   Continued use of alcohol despite problems in relationships because of alcohol use.  Continued use of alcohol in situations when it is physically hazardous, such as driving a car.  Continued use of alcohol despite awareness of a physical or psychological problem that is likely related to alcohol use. Physical problems related to alcohol use can involve the brain, heart, liver, stomach, and intestines. Psychological problems related to alcohol use include intoxication, depression, anxiety, psychosis, delirium, and dementia.   The need for increased amounts of alcohol to achieve the same desired effect, or a decreased effect from the consumption of the same amount of alcohol (tolerance).  Withdrawal symptoms upon reducing or stopping alcohol use, or alcohol use to reduce or avoid withdrawal symptoms. Withdrawal symptoms include:  Racing heart.  Hand tremor.  Difficulty  sleeping.  Nausea.  Vomiting.  Hallucinations.  Restlessness.  Seizures. DIAGNOSIS Alcohol use disorder is diagnosed through an assessment by your health care provider. Your health care provider may start by asking three or four questions to screen for excessive or problematic alcohol use. To confirm a diagnosis of alcohol use disorder, at least two symptoms must be present within a 106-monthperiod. The severity of alcohol use disorder depends on the number of symptoms:  Mild--two or three.  Moderate--four or five.  Severe--six or more. Your health care provider may perform a physical exam or use results from lab tests to see if you have physical problems resulting from alcohol use. Your health care provider may refer you to a mental health professional for evaluation. TREATMENT  Some people with alcohol use disorder are able to reduce their alcohol use to low-risk levels. Some people with alcohol use disorder need to quit drinking alcohol. When necessary, mental health professionals with specialized training in substance use treatment can help. Your health care provider can help you decide how severe your alcohol use disorder is and what type of treatment you need. The following forms of treatment are available:   Detoxification. Detoxification involves the use of prescription medicines to prevent alcohol withdrawal symptoms in the first week after quitting. This is important for people with a history of symptoms of withdrawal and for heavy drinkers who are likely to have withdrawal symptoms. Alcohol withdrawal can be dangerous and, in severe cases, cause death. Detoxification  is usually provided in a hospital or in-patient substance use treatment facility.  Counseling or talk therapy. Talk therapy is provided by substance use treatment counselors. It addresses the reasons people use alcohol and ways to keep them from drinking again. The goals of talk therapy are to help people with alcohol  use disorder find healthy activities and ways to cope with life stress, to identify and avoid triggers for alcohol use, and to handle cravings, which can cause relapse.  Medicines.Different medicines can help treat alcohol use disorder through the following actions:  Decrease alcohol cravings.  Decrease the positive reward response felt from alcohol use.  Produce an uncomfortable physical reaction when alcohol is used (aversion therapy).  Support groups. Support groups are run by people who have quit drinking. They provide emotional support, advice, and guidance. These forms of treatment are often combined. Some people with alcohol use disorder benefit from intensive combination treatment provided by specialized substance use treatment centers. Both inpatient and outpatient treatment programs are available. Document Released: 08/12/2004 Document Revised: 11/19/2013 Document Reviewed: 10/12/2012 [facility] Patient Information 2015 [place], [place]. This information is not intended to replace advice given to you by your health care provider. Make sure you discuss any questions you have with your health care provider.  Alcohol Withdrawal Anytime drug use is interfering with normal living activities it has become abuse. This includes problems with family and friends. Psychological dependence has developed when your mind tells you that the drug is needed. This is usually followed by physical dependence when a continuing increase of drugs are required to get the same feeling or "high." This is known as addiction or chemical dependency. A person's risk is much higher if there is a history of chemical dependency in the family. Mild Withdrawal Following Stopping Alcohol, When Addiction or Chemical Dependency Has Developed When a person has developed tolerance to alcohol, any sudden stopping of alcohol can cause uncomfortable physical symptoms. Most of the time these are mild and consist of tremors in the  hands and increases in heart rate, breathing, and temperature. Sometimes these symptoms are associated with anxiety, panic attacks, and bad dreams. There may also be stomach upset. Normal sleep patterns are often interrupted with periods of inability to sleep (insomnia). This may last for 6 months. Because of this discomfort, many people choose to continue drinking to get rid of this discomfort and to try to feel normal. Severe Withdrawal with Decreased or No Alcohol Intake, When Addiction or Chemical Dependency Has Developed About five percent of alcoholics will develop signs of severe withdrawal when they stop using alcohol. One sign of this is development of generalized seizures (convulsions). Other signs of this are severe agitation and confusion. This may be associated with believing in things which are not real or seeing things which are not really there (delusions and hallucinations). Vitamin deficiencies are usually present if alcohol intake has been long-term. Treatment for this most often requires hospitalization and close observation. Addiction can only be helped by stopping use of all chemicals. This is hard but may save your life. With continual alcohol use, possible outcomes are usually loss of self respect and esteem, violence, and death. Addiction cannot be cured but it can be stopped. This often requires outside help and the care of professionals. Treatment centers are listed in the yellow pages under Cocaine, Narcotics, and Alcoholics Anonymous. Most hospitals and clinics can refer you to a specialized care center. It is not necessary for you to go through the uncomfortable symptoms  of withdrawal. Your caregiver can provide you with medicines that will help you through this difficult period. Try to avoid situations, friends, or drugs that made it possible for you to keep using alcohol in the past. Learn how to say no. It takes a long period of time to overcome addictions to all drugs,  including alcohol. There may be many times when you feel as though you want a drink. After getting rid of the physical addiction and withdrawal, you will have a lessening of the craving which tells you that you need alcohol to feel normal. Call your caregiver if more support is needed. Learn who to talk to in your family and among your friends so that during these periods you can receive outside help. Alcoholics Anonymous (AA) has helped many people over the years. To get further help, contact AA or call your caregiver, counselor, or clergyperson. Al-Anon and Alateen are support groups for friends and family members of an alcoholic. The people who love and care for an alcoholic often need help, too. For information about these organizations, check your phone directory or call a local alcoholism treatment center.  SEEK IMMEDIATE MEDICAL CARE IF:   You have a seizure.  You have a fever.  You experience uncontrolled vomiting or you vomit up blood. This may be bright red or look like black coffee grounds.  You have blood in the stool. This may be bright red or appear as a black, tarry, bad-smelling stool.  You become lightheaded or faint. Do not drive if you feel this way. Have someone else drive you or call 263 for help.  You become more agitated or confused.  You develop uncontrolled anxiety.  You begin to see things that are not really there (hallucinate). Your caregiver has determined that you completely understand your medical condition, and that your mental state is back to normal. You understand that you have been treated for alcohol withdrawal, have agreed not to drink any alcohol for a minimum of 1 day, will not operate a car or other machinery for 24 hours, and have had an opportunity to ask any questions about your condition. Document Released: 04/14/2005 Document Revised: 09/27/2011 Document Reviewed: 02/21/2008 [facility] Patient Information 2015 [place], [place]. This information is not  intended to replace advice given to you by your health care provider. Make sure you discuss any questions you have with your health care provider.

## 2014-04-04 NOTE — Progress Notes (Signed)
Patient verbalized understanding of discharge instructions. Patient is stable at discharge. Patient is calm and cooperative at discharge.

## 2014-04-04 NOTE — ED Notes (Signed)
1 bag of pt belongings locker # [id number]

## 2014-04-04 NOTE — ED Notes (Signed)
Report given to lauryn

## 2014-04-04 NOTE — ED Notes (Signed)
EDP talked to pt about staying and talking w/ psychiatry doctor, pt okay with this, will not be IVC'd at this time.

## 2014-04-04 NOTE — ED Notes (Signed)
Pt states he is ready to go home, denies SI/HI, states "I was never suicidal, I was drunk", pt states mother will come get him.

## 2014-04-05 ENCOUNTER — Emergency Department (HOSPITAL_COMMUNITY)
Admission: EM | Admit: 2014-04-05 | Discharge: 2014-04-06 | Discharge status: Against Medical Advice | Payer: No Typology Code available for payment source | Attending: Emergency Medicine | Admitting: Emergency Medicine

## 2014-04-05 ENCOUNTER — Encounter (HOSPITAL_COMMUNITY): Payer: Self-pay | Admitting: Emergency Medicine

## 2014-04-05 DIAGNOSIS — F10920 Alcohol use, unspecified with intoxication, uncomplicated: Secondary | ICD-10-CM

## 2014-04-05 DIAGNOSIS — F319 Bipolar disorder, unspecified: Secondary | ICD-10-CM | POA: Insufficient documentation

## 2014-04-05 DIAGNOSIS — F1410 Cocaine abuse, uncomplicated: Secondary | ICD-10-CM | POA: Insufficient documentation

## 2014-04-05 DIAGNOSIS — Z853 Personal history of malignant neoplasm of breast: Secondary | ICD-10-CM | POA: Insufficient documentation

## 2014-04-05 DIAGNOSIS — I2510 Atherosclerotic heart disease of native coronary artery without angina pectoris: Secondary | ICD-10-CM | POA: Insufficient documentation

## 2014-04-05 DIAGNOSIS — Z8719 Personal history of other diseases of the digestive system: Secondary | ICD-10-CM | POA: Insufficient documentation

## 2014-04-05 DIAGNOSIS — I209 Angina pectoris, unspecified: Secondary | ICD-10-CM | POA: Insufficient documentation

## 2014-04-05 DIAGNOSIS — Z85528 Personal history of other malignant neoplasm of kidney: Secondary | ICD-10-CM | POA: Insufficient documentation

## 2014-04-05 DIAGNOSIS — F209 Schizophrenia, unspecified: Secondary | ICD-10-CM | POA: Insufficient documentation

## 2014-04-05 DIAGNOSIS — F1310 Sedative, hypnotic or anxiolytic abuse, uncomplicated: Secondary | ICD-10-CM | POA: Insufficient documentation

## 2014-04-05 DIAGNOSIS — Z79899 Other long term (current) drug therapy: Secondary | ICD-10-CM | POA: Insufficient documentation

## 2014-04-05 DIAGNOSIS — F17200 Nicotine dependence, unspecified, uncomplicated: Secondary | ICD-10-CM | POA: Insufficient documentation

## 2014-04-05 DIAGNOSIS — I10 Essential (primary) hypertension: Secondary | ICD-10-CM | POA: Insufficient documentation

## 2014-04-05 DIAGNOSIS — F1010 Alcohol abuse, uncomplicated: Secondary | ICD-10-CM

## 2014-04-05 DIAGNOSIS — E7800 Pure hypercholesterolemia, unspecified: Secondary | ICD-10-CM | POA: Insufficient documentation

## 2014-04-05 DIAGNOSIS — Z880 Allergy status to penicillin: Secondary | ICD-10-CM | POA: Insufficient documentation

## 2014-04-05 MED ORDER — ONDANSETRON HCL 4 MG PO TABS
4.0000 mg | ORAL_TABLET | Freq: Three times a day (TID) | ORAL | Status: DC | PRN
  Filled 2014-04-05: qty 1

## 2014-04-05 MED ORDER — VITAMIN B-1 100 MG PO TABS: 100.0000 mg | ORAL_TABLET | Freq: Every day | ORAL | Status: DC

## 2014-04-05 MED ORDER — ZOLPIDEM TARTRATE 5 MG PO TABS
5.0000 mg | ORAL_TABLET | Freq: Every evening | ORAL | Status: DC | PRN
Start: 2014-04-05 — End: 2014-04-06

## 2014-04-05 MED ORDER — LORAZEPAM 1 MG PO TABS
0.0000 mg | ORAL_TABLET | Freq: Four times a day (QID) | ORAL | Status: DC
Start: 2014-04-06 — End: 2014-04-06

## 2014-04-05 MED ORDER — ALUM & MAG HYDROXIDE-SIMETH 200-200-20 MG/5ML PO SUSP: 30.0000 mL | ORAL | Status: DC | PRN

## 2014-04-05 MED ORDER — LORAZEPAM 1 MG PO TABS: 0.0000 mg | ORAL_TABLET | Freq: Two times a day (BID) | ORAL | Status: DC

## 2014-04-05 MED ORDER — THIAMINE HCL 100 MG/ML IJ SOLN: 100.0000 mg | Freq: Every day | INTRAMUSCULAR | Status: DC

## 2014-04-05 NOTE — ED Notes (Signed)
Bed: WA04 Expected date:  Expected time:  Means of arrival:  Comments: EMS 33yo ETOH 6 bottles of Wine / crack cocaine

## 2014-04-05 NOTE — ED Notes (Signed)
Per EMS pt presents d/t ETOH intoxication.  Pt reports drinking 6 bottles of wine today as well as using crack cocaine.  Pt is adamant about leaving upon arrival.

## 2014-04-05 NOTE — ED Provider Notes (Signed)
CSN: [id number]     Arrival date & time 04/05/14  2317 History   First MD Initiated Contact with Patient 04/05/14 2326     Chief Complaint  Patient presents with  . Alcohol Intoxication     (Consider location/radiation/quality/duration/timing/severity/associated sxs/prior Treatment) HPI Patient presents to the emergency department due to alcohol intoxication.  The patient, states, that she uses crack, and drink 6 bottles of wine per day.  The patient, states, that she would like to leave we are not going to give him IV medications.  The patient does not give me any other history.  He states that he would like detox from alcohol.  Patient has been seen multiple times for similar situations in the past Past Medical History  Diagnosis Date  . Seizures   . Hypertension   . Depression   . Pancreatitis   . Liver cirrhosis   . Coronary artery disease   . Angina   . Shortness of breath   . Headache(784.0)   . Peripheral vascular disease April 2011    Left Pop  . Hypercholesteremia   . Schizophrenia   . Bipolar 1 disorder   . Cancer of kidney dx'd 04/2013    lt nephrectomy  . Breast CA dx'd 2009    bil w/ bil masectomy and oral meds   Past Surgical History  Procedure Laterality Date  . Chest surgery    . Left leg surgery    . Mastectomy    . Left kidney removal    . Breast surgery     Family History  Problem Relation Age of Onset  . Stroke Other    History  Substance Use Topics  . Smoking status: Current Every Day Smoker -- 0.10 packs/day for 4 years    Types: Cigarettes  . Smokeless tobacco: Never Used  . Alcohol Use: Not on file     Comment: at [facility] for ETOH and cocaine    Review of Systems  Level V caveat applies due to intoxication  Allergies  Codeine; Depakote; Morphine; Penicillins; and Hydrocodone-acetaminophen  Home Medications   Prior to Admission medications   Medication Sig Start Date End Date Taking? Authorizing Provider  acamprosate (CAMPRAL)  333 MG tablet Take 2 tablets (666 mg total) by mouth 3 (three) times daily with meals. 02/06/14  Yes Benjamine Mola, FNP  amLODipine (NORVASC) 5 MG tablet Take 1 tablet (5 mg total) by mouth daily. 02/06/14  Yes Benjamine Mola, FNP  ARIPiprazole (ABILIFY) 10 MG tablet Take 1 tablet (10 mg total) by mouth at bedtime. 02/06/14  Yes Benjamine Mola, FNP  DULoxetine (CYMBALTA) 30 MG capsule Take 1 capsule (30 mg total) by mouth daily. 02/06/14  Yes Benjamine Mola, FNP  gabapentin (NEURONTIN) 100 MG capsule Take 2 capsules (200 mg total) by mouth 3 (three) times daily. 02/06/14  Yes Benjamine Mola, FNP  metoprolol (LOPRESSOR) 50 MG tablet Take 1 tablet (50 mg total) by mouth 2 (two) times daily. 02/06/14  Yes Benjamine Mola, FNP  mirtazapine (REMERON SOL-TAB) 15 MG disintegrating tablet Take 1 tablet (15 mg total) by mouth at bedtime. 02/06/14  Yes Benjamine Mola, FNP  simvastatin (ZOCOR) 40 MG tablet Take 1 tablet (40 mg total) by mouth daily. 02/06/14  Yes Benjamine Mola, FNP  traZODone (DESYREL) 100 MG tablet Take 2 tablets (200 mg total) by mouth at bedtime. 02/06/14  Yes Benjamine Mola, FNP  SUMAtriptan (IMITREX) 25 MG tablet Take 1 tablet (25 mg  total) by mouth every 2 (two) hours as needed for migraine or headache. May repeat in 2 hours if headache persists or recurs. 02/06/14   Elyse Jarvis Withrow, FNP   BP 144/103  Pulse 88  Temp(Src) 98.7 F (37.1 C) (Oral)  Resp 18  SpO2 95% Physical Exam  Nursing note and vitals reviewed. Constitutional: He is oriented to person, place, and time. He appears well-developed and well-nourished. No distress.  HENT:  Head: Normocephalic and atraumatic.  Mouth/Throat: Oropharynx is clear and moist.  Eyes: Pupils are equal, round, and reactive to light.  Neck: Normal range of motion. Neck supple.  Cardiovascular: Normal rate and regular rhythm.   Pulmonary/Chest: Effort normal and breath sounds normal.  Neurological: He is alert and oriented to person, place, and time. He  exhibits normal muscle tone. Coordination normal.  Skin: Skin is warm and dry. No rash noted. No erythema.    ED Course  Procedures (including critical care time) Patient is able to ambulate without difficulty and he is answering questions appropriately.  Patient is adamant about leaving.  We did not give him IV medications    Brent General, PA-C 04/06/14 0005

## 2014-04-06 LAB — BASIC METABOLIC PANEL
Anion gap: 16 — ABNORMAL HIGH (ref 5–15)
BUN: 7 mg/dL (ref 6–23)
CO2: 28 mEq/L (ref 19–32)
Calcium: 9.2 mg/dL (ref 8.4–10.5)
Chloride: 98 mEq/L (ref 96–112)
Creatinine, Ser: 1.08 mg/dL (ref 0.50–1.35)
GFR calc Af Amer: >90 mL/min (ref >90)
GFR calc non Af Amer: 88 mL/min — ABNORMAL LOW (ref >90)
Glucose, Bld: 76 mg/dL (ref 70–99)
Potassium: 4.1 mEq/L (ref 3.7–5.3)
Sodium: 142 mEq/L (ref 137–147)

## 2014-04-06 LAB — CBC WITH DIFFERENTIAL/PLATELET
Basophils Absolute: 0 10*3/uL (ref 0.0–0.1)
Basophils Relative: 1 % (ref 0–1)
Eosinophils Absolute: 0.1 10*3/uL (ref 0.0–0.7)
Eosinophils Relative: 2 % (ref 0–5)
HCT: 49.2 % (ref 39.0–52.0)
Hemoglobin: 17.3 g/dL — ABNORMAL HIGH (ref 13.0–17.0)
Lymphocytes Relative: 60 % — ABNORMAL HIGH (ref 12–46)
Lymphs Abs: 2.5 10*3/uL (ref 0.7–4.0)
MCH: 32.4 pg (ref 26.0–34.0)
MCHC: 35.2 g/dL (ref 30.0–36.0)
MCV: 92.1 fL (ref 78.0–100.0)
Monocytes Absolute: 0.3 10*3/uL (ref 0.1–1.0)
Monocytes Relative: 8 % (ref 3–12)
Neutro Abs: 1.3 10*3/uL — ABNORMAL LOW (ref 1.7–7.7)
Neutrophils Relative %: 30 % — ABNORMAL LOW (ref 43–77)
Platelets: 107 10*3/uL — ABNORMAL LOW (ref 150–400)
RBC: 5.34 MIL/uL (ref 4.22–5.81)
RDW: 15.7 % — ABNORMAL HIGH (ref 11.5–15.5)
WBC: 4.2 10*3/uL (ref 4.0–10.5)

## 2014-04-06 LAB — RAPID URINE DRUG SCREEN, HOSP PERFORMED
Amphetamines: NOT DETECTED
Barbiturates: NOT DETECTED
Benzodiazepines: POSITIVE — AB
Cocaine: POSITIVE — AB
Opiates: NOT DETECTED
Tetrahydrocannabinol: NOT DETECTED

## 2014-04-06 LAB — ETHANOL: Alcohol, Ethyl (B): 398 mg/dL — ABNORMAL HIGH (ref 0–11)

## 2014-04-06 NOTE — ED Notes (Signed)
Patient states he does not wish to stay. Parents at bedside are trying to talk patient into staying. Patient refuses.  Patient d/c'd [facility]

## 2014-04-07 NOTE — ED Provider Notes (Signed)
Medical screening examination/treatment/procedure(s) were performed by non-physician practitioner and as supervising physician I was immediately available for consultation/collaboration.   EKG Interpretation None        Julianne Rice, MD 04/07/14 [phone]

## 2014-04-15 ENCOUNTER — Encounter (HOSPITAL_COMMUNITY): Payer: Self-pay | Admitting: Emergency Medicine

## 2014-04-15 ENCOUNTER — Inpatient Hospital Stay (HOSPITAL_COMMUNITY)
Admission: EM | Admit: 2014-04-15 | Discharge: 2014-04-18 | DRG: 897 | Disposition: A | Discharge status: Home / Self Care | Payer: Self-pay | Attending: Internal Medicine | Admitting: Internal Medicine

## 2014-04-15 ENCOUNTER — Emergency Department (HOSPITAL_COMMUNITY): Payer: Self-pay

## 2014-04-15 ENCOUNTER — Inpatient Hospital Stay (HOSPITAL_COMMUNITY): Payer: No Typology Code available for payment source

## 2014-04-15 DIAGNOSIS — F1490 Cocaine use, unspecified, uncomplicated: Secondary | ICD-10-CM

## 2014-04-15 DIAGNOSIS — Z79899 Other long term (current) drug therapy: Secondary | ICD-10-CM

## 2014-04-15 DIAGNOSIS — R7989 Other specified abnormal findings of blood chemistry: Secondary | ICD-10-CM

## 2014-04-15 DIAGNOSIS — R740 Nonspecific elevation of levels of transaminase and lactic acid dehydrogenase [LDH]: Secondary | ICD-10-CM

## 2014-04-15 DIAGNOSIS — F209 Schizophrenia, unspecified: Secondary | ICD-10-CM | POA: Diagnosis present

## 2014-04-15 DIAGNOSIS — N2889 Other specified disorders of kidney and ureter: Secondary | ICD-10-CM

## 2014-04-15 DIAGNOSIS — R079 Chest pain, unspecified: Secondary | ICD-10-CM

## 2014-04-15 DIAGNOSIS — E7800 Pure hypercholesterolemia, unspecified: Secondary | ICD-10-CM

## 2014-04-15 DIAGNOSIS — Z905 Acquired absence of kidney: Secondary | ICD-10-CM

## 2014-04-15 DIAGNOSIS — F14288 Cocaine dependence with other cocaine-induced disorder: Secondary | ICD-10-CM | POA: Diagnosis present

## 2014-04-15 DIAGNOSIS — D6959 Other secondary thrombocytopenia: Secondary | ICD-10-CM | POA: Diagnosis present

## 2014-04-15 DIAGNOSIS — F10939 Alcohol use, unspecified with withdrawal, unspecified: Secondary | ICD-10-CM

## 2014-04-15 DIAGNOSIS — I10 Essential (primary) hypertension: Secondary | ICD-10-CM

## 2014-04-15 DIAGNOSIS — E785 Hyperlipidemia, unspecified: Secondary | ICD-10-CM | POA: Diagnosis present

## 2014-04-15 DIAGNOSIS — R569 Unspecified convulsions: Secondary | ICD-10-CM

## 2014-04-15 DIAGNOSIS — D696 Thrombocytopenia, unspecified: Secondary | ICD-10-CM

## 2014-04-15 DIAGNOSIS — R748 Abnormal levels of other serum enzymes: Secondary | ICD-10-CM

## 2014-04-15 DIAGNOSIS — R7401 Elevation of levels of liver transaminase levels: Secondary | ICD-10-CM

## 2014-04-15 DIAGNOSIS — Z85528 Personal history of other malignant neoplasm of kidney: Secondary | ICD-10-CM

## 2014-04-15 DIAGNOSIS — K7030 Alcoholic cirrhosis of liver without ascites: Secondary | ICD-10-CM | POA: Diagnosis present

## 2014-04-15 DIAGNOSIS — F1410 Cocaine abuse, uncomplicated: Secondary | ICD-10-CM

## 2014-04-15 DIAGNOSIS — Z853 Personal history of malignant neoplasm of breast: Secondary | ICD-10-CM

## 2014-04-15 DIAGNOSIS — F17210 Nicotine dependence, cigarettes, uncomplicated: Secondary | ICD-10-CM | POA: Diagnosis present

## 2014-04-15 DIAGNOSIS — F4310 Post-traumatic stress disorder, unspecified: Secondary | ICD-10-CM

## 2014-04-15 DIAGNOSIS — F1014 Alcohol abuse with alcohol-induced mood disorder: Secondary | ICD-10-CM

## 2014-04-15 DIAGNOSIS — R945 Abnormal results of liver function studies: Secondary | ICD-10-CM

## 2014-04-15 DIAGNOSIS — F1010 Alcohol abuse, uncomplicated: Secondary | ICD-10-CM

## 2014-04-15 DIAGNOSIS — N390 Urinary tract infection, site not specified: Secondary | ICD-10-CM

## 2014-04-15 DIAGNOSIS — K219 Gastro-esophageal reflux disease without esophagitis: Secondary | ICD-10-CM | POA: Diagnosis present

## 2014-04-15 DIAGNOSIS — F339 Major depressive disorder, recurrent, unspecified: Secondary | ICD-10-CM | POA: Diagnosis present

## 2014-04-15 DIAGNOSIS — R791 Abnormal coagulation profile: Secondary | ICD-10-CM

## 2014-04-15 DIAGNOSIS — F10239 Alcohol dependence with withdrawal, unspecified: Principal | ICD-10-CM | POA: Diagnosis present

## 2014-04-15 DIAGNOSIS — F332 Major depressive disorder, recurrent severe without psychotic features: Secondary | ICD-10-CM

## 2014-04-15 DIAGNOSIS — F1910 Other psychoactive substance abuse, uncomplicated: Secondary | ICD-10-CM | POA: Diagnosis present

## 2014-04-15 DIAGNOSIS — F1210 Cannabis abuse, uncomplicated: Secondary | ICD-10-CM | POA: Diagnosis present

## 2014-04-15 LAB — COMPREHENSIVE METABOLIC PANEL
ALT: 316 U/L — ABNORMAL HIGH (ref 0–53)
AST: 411 U/L — ABNORMAL HIGH (ref 0–37)
Albumin: 4.4 g/dL (ref 3.5–5.2)
Alkaline Phosphatase: 74 U/L (ref 39–117)
Anion gap: 17 — ABNORMAL HIGH (ref 5–15)
BUN: 10 mg/dL (ref 6–23)
CALCIUM: 9.9 mg/dL (ref 8.4–10.5)
CO2: 26 meq/L (ref 19–32)
CREATININE: 0.9 mg/dL (ref 0.50–1.35)
Chloride: 94 mEq/L — ABNORMAL LOW (ref 96–112)
Glucose, Bld: 84 mg/dL (ref 70–99)
Potassium: 4.4 mEq/L (ref 3.7–5.3)
SODIUM: 137 meq/L (ref 137–147)
Total Bilirubin: 1.3 mg/dL — ABNORMAL HIGH (ref 0.3–1.2)
Total Protein: 8.3 g/dL (ref 6.0–8.3)

## 2014-04-15 LAB — CBC WITH DIFFERENTIAL/PLATELET
Basophils Absolute: 0 10*3/uL (ref 0.0–0.1)
Basophils Relative: 1 % (ref 0–1)
EOS PCT: 0 % (ref 0–5)
Eosinophils Absolute: 0 10*3/uL (ref 0.0–0.7)
HEMATOCRIT: 42.4 % (ref 39.0–52.0)
HEMOGLOBIN: 15.4 g/dL (ref 13.0–17.0)
LYMPHS PCT: 18 % (ref 12–46)
Lymphs Abs: 0.6 10*3/uL — ABNORMAL LOW (ref 0.7–4.0)
MCH: 32.4 pg (ref 26.0–34.0)
MCHC: 36.3 g/dL — ABNORMAL HIGH (ref 30.0–36.0)
MCV: 89.3 fL (ref 78.0–100.0)
MONO ABS: 0.5 10*3/uL (ref 0.1–1.0)
Monocytes Relative: 14 % — ABNORMAL HIGH (ref 3–12)
Neutro Abs: 2.2 10*3/uL (ref 1.7–7.7)
Neutrophils Relative %: 67 % (ref 43–77)
Platelets: 68 10*3/uL — ABNORMAL LOW (ref 150–400)
RBC: 4.75 MIL/uL (ref 4.22–5.81)
RDW: 14.7 % (ref 11.5–15.5)
WBC: 3.3 10*3/uL — AB (ref 4.0–10.5)

## 2014-04-15 LAB — TSH: TSH: 0.75 u[IU]/mL (ref 0.350–4.500)

## 2014-04-15 LAB — URINE MICROSCOPIC-ADD ON

## 2014-04-15 LAB — RAPID URINE DRUG SCREEN, HOSP PERFORMED
Amphetamines: NOT DETECTED
BENZODIAZEPINES: POSITIVE — AB
Barbiturates: NOT DETECTED
COCAINE: POSITIVE — AB
Opiates: NOT DETECTED
TETRAHYDROCANNABINOL: POSITIVE — AB

## 2014-04-15 LAB — ACETAMINOPHEN LEVEL

## 2014-04-15 LAB — URINALYSIS, ROUTINE W REFLEX MICROSCOPIC
GLUCOSE, UA: NEGATIVE mg/dL
HGB URINE DIPSTICK: NEGATIVE
Ketones, ur: 40 mg/dL — AB
Nitrite: POSITIVE — AB
PH: 8 (ref 5.0–8.0)
Protein, ur: 100 mg/dL — AB
SPECIFIC GRAVITY, URINE: 1.03 (ref 1.005–1.030)
UROBILINOGEN UA: 1 mg/dL (ref 0.0–1.0)

## 2014-04-15 LAB — MAGNESIUM: Magnesium: 1.6 mg/dL (ref 1.5–2.5)

## 2014-04-15 LAB — TROPONIN I

## 2014-04-15 LAB — LIPASE, BLOOD: Lipase: 59 U/L (ref 11–59)

## 2014-04-15 LAB — PHOSPHORUS: Phosphorus: 2.3 mg/dL (ref 2.3–4.6)

## 2014-04-15 LAB — D-DIMER, QUANTITATIVE: D-Dimer, Quant: 3.24 ug/mL-FEU — ABNORMAL HIGH (ref 0.00–0.48)

## 2014-04-15 LAB — SALICYLATE LEVEL: Salicylate Lvl: <2 mg/dL — ABNORMAL LOW (ref 2.8–20.0)

## 2014-04-15 LAB — I-STAT TROPONIN, ED: Troponin i, poc: 0 ng/mL (ref 0.00–0.08)

## 2014-04-15 LAB — ETHANOL: Alcohol, Ethyl (B): <11 mg/dL (ref 0–11)

## 2014-04-15 MED ORDER — VITAMIN B-1 100 MG PO TABS
100.0000 mg | ORAL_TABLET | Freq: Every day | ORAL | Status: DC
  Administered 2014-04-15 – 2014-04-18 (×4): 100 mg via ORAL
  Filled 2014-04-15 (×4): qty 1

## 2014-04-15 MED ORDER — ADULT MULTIVITAMIN W/MINERALS CH
1.0000 | ORAL_TABLET | Freq: Every day | ORAL | Status: DC
  Administered 2014-04-15 – 2014-04-18 (×4): 1 via ORAL
  Filled 2014-04-15 (×4): qty 1

## 2014-04-15 MED ORDER — THIAMINE HCL 100 MG/ML IJ SOLN
Freq: Once | INTRAVENOUS | Status: AC
  Administered 2014-04-15: 20:00:00 via INTRAVENOUS
  Filled 2014-04-15: qty 1000

## 2014-04-15 MED ORDER — LORAZEPAM 1 MG PO TABS
0.0000 mg | ORAL_TABLET | Freq: Four times a day (QID) | ORAL | Status: DC
  Administered 2014-04-15: 2 mg via ORAL

## 2014-04-15 MED ORDER — SIMVASTATIN 40 MG PO TABS
40.0000 mg | ORAL_TABLET | Freq: Every day | ORAL | Status: DC
  Administered 2014-04-15: 40 mg via ORAL
  Filled 2014-04-15: qty 1

## 2014-04-15 MED ORDER — ONDANSETRON HCL 4 MG/2ML IJ SOLN: 4.0000 mg | Freq: Three times a day (TID) | INTRAMUSCULAR | Status: DC | PRN

## 2014-04-15 MED ORDER — NICOTINE 14 MG/24HR TD PT24
14.0000 mg | MEDICATED_PATCH | Freq: Every day | TRANSDERMAL | Status: DC
  Filled 2014-04-15 (×4): qty 1

## 2014-04-15 MED ORDER — THIAMINE HCL 100 MG/ML IJ SOLN
100.0000 mg | Freq: Once | INTRAMUSCULAR | Status: AC
  Administered 2014-04-15: 100 mg via INTRAVENOUS
  Filled 2014-04-15: qty 2

## 2014-04-15 MED ORDER — LORAZEPAM 2 MG/ML IJ SOLN: 0.0000 mg | Freq: Two times a day (BID) | INTRAMUSCULAR | Status: DC

## 2014-04-15 MED ORDER — CIPROFLOXACIN IN D5W 400 MG/200ML IV SOLN
400.0000 mg | Freq: Two times a day (BID) | INTRAVENOUS | Status: DC
  Administered 2014-04-16 (×2): 400 mg via INTRAVENOUS
  Filled 2014-04-15 (×4): qty 200

## 2014-04-15 MED ORDER — ONDANSETRON HCL 4 MG PO TABS: 4.0000 mg | ORAL_TABLET | Freq: Four times a day (QID) | ORAL | Status: DC | PRN

## 2014-04-15 MED ORDER — SODIUM CHLORIDE 0.9 % IV BOLUS (SEPSIS)
1000.0000 mL | Freq: Once | INTRAVENOUS | Status: AC
  Administered 2014-04-15: 1000 mL via INTRAVENOUS

## 2014-04-15 MED ORDER — PANTOPRAZOLE SODIUM 40 MG PO TBEC
40.0000 mg | DELAYED_RELEASE_TABLET | Freq: Every day | ORAL | Status: DC
  Administered 2014-04-16 – 2014-04-18 (×3): 40 mg via ORAL
  Filled 2014-04-15 (×3): qty 1

## 2014-04-15 MED ORDER — THIAMINE HCL 100 MG/ML IJ SOLN: 100.0000 mg | Freq: Every day | INTRAMUSCULAR | Status: DC

## 2014-04-15 MED ORDER — ONDANSETRON HCL 4 MG/2ML IJ SOLN
4.0000 mg | Freq: Four times a day (QID) | INTRAMUSCULAR | Status: DC | PRN
  Administered 2014-04-15: 4 mg via INTRAVENOUS
  Filled 2014-04-15: qty 2

## 2014-04-15 MED ORDER — LORAZEPAM 2 MG/ML IJ SOLN
0.0000 mg | Freq: Four times a day (QID) | INTRAMUSCULAR | Status: AC
  Administered 2014-04-16: 1 mg via INTRAVENOUS
  Filled 2014-04-15: qty 1

## 2014-04-15 MED ORDER — DIPHENHYDRAMINE HCL 50 MG/ML IJ SOLN: 25.0000 mg | Freq: Four times a day (QID) | INTRAMUSCULAR | Status: DC | PRN

## 2014-04-15 MED ORDER — SODIUM CHLORIDE 0.9 % IV SOLN
INTRAVENOUS | Status: DC
  Administered 2014-04-16 (×2): via INTRAVENOUS
  Administered 2014-04-16: 1000 mL via INTRAVENOUS
  Administered 2014-04-17: 75 mL/h via INTRAVENOUS
  Administered 2014-04-18: 03:00:00 via INTRAVENOUS

## 2014-04-15 MED ORDER — MORPHINE SULFATE 2 MG/ML IJ SOLN: 1.0000 mg | Freq: Four times a day (QID) | INTRAMUSCULAR | Status: DC | PRN

## 2014-04-15 MED ORDER — ONDANSETRON 4 MG PO TBDP
4.0000 mg | ORAL_TABLET | Freq: Once | ORAL | Status: AC
  Administered 2014-04-15: 4 mg via ORAL
  Filled 2014-04-15: qty 1

## 2014-04-15 MED ORDER — LORAZEPAM 2 MG/ML IJ SOLN
2.0000 mg | Freq: Once | INTRAMUSCULAR | Status: AC
  Administered 2014-04-15: 2 mg via INTRAVENOUS
  Filled 2014-04-15: qty 1

## 2014-04-15 MED ORDER — LORAZEPAM 1 MG PO TABS
0.0000 mg | ORAL_TABLET | Freq: Two times a day (BID) | ORAL | Status: DC
  Filled 2014-04-15: qty 2

## 2014-04-15 MED ORDER — SUCRALFATE 1 GM/10ML PO SUSP
1.0000 g | Freq: Three times a day (TID) | ORAL | Status: DC
  Administered 2014-04-15 – 2014-04-17 (×8): 1 g via ORAL
  Filled 2014-04-15 (×10): qty 10

## 2014-04-15 MED ORDER — LORAZEPAM 1 MG PO TABS
2.0000 mg | ORAL_TABLET | Freq: Once | ORAL | Status: AC
  Administered 2014-04-15: 2 mg via ORAL
  Filled 2014-04-15: qty 4

## 2014-04-15 MED ORDER — ZOLPIDEM TARTRATE 5 MG PO TABS
5.0000 mg | ORAL_TABLET | Freq: Once | ORAL | Status: AC
  Administered 2014-04-15: 5 mg via ORAL
  Filled 2014-04-15: qty 1

## 2014-04-15 MED ORDER — SODIUM CHLORIDE 0.9 % IJ SOLN: 3.0000 mL | Freq: Two times a day (BID) | INTRAMUSCULAR | Status: DC

## 2014-04-15 MED ORDER — AMLODIPINE BESYLATE 5 MG PO TABS
5.0000 mg | ORAL_TABLET | Freq: Every day | ORAL | Status: DC
  Administered 2014-04-15 – 2014-04-18 (×4): 5 mg via ORAL
  Filled 2014-04-15 (×4): qty 1

## 2014-04-15 MED ORDER — IOHEXOL 350 MG/ML SOLN
80.0000 mL | Freq: Once | INTRAVENOUS | Status: AC | PRN
Start: 2014-04-15 — End: 2014-04-15
  Administered 2014-04-15: 80 mL via INTRAVENOUS

## 2014-04-15 MED ORDER — VITAMIN B-1 100 MG PO TABS: 100.0000 mg | ORAL_TABLET | Freq: Every day | ORAL | Status: DC

## 2014-04-15 MED ORDER — ATORVASTATIN CALCIUM 20 MG PO TABS
20.0000 mg | ORAL_TABLET | Freq: Every day | ORAL | Status: DC
  Administered 2014-04-15 – 2014-04-16 (×2): 20 mg via ORAL
  Filled 2014-04-15 (×2): qty 1

## 2014-04-15 MED ORDER — MIRTAZAPINE 15 MG PO TBDP
15.0000 mg | ORAL_TABLET | Freq: Every day | ORAL | Status: DC
  Administered 2014-04-15 – 2014-04-17 (×3): 15 mg via ORAL
  Filled 2014-04-15 (×4): qty 1

## 2014-04-15 MED ORDER — LORAZEPAM 1 MG PO TABS
1.0000 mg | ORAL_TABLET | Freq: Four times a day (QID) | ORAL | Status: DC | PRN
Start: 2014-04-15 — End: 2014-04-18
  Administered 2014-04-17: 1 mg via ORAL
  Filled 2014-04-15: qty 1

## 2014-04-15 MED ORDER — GABAPENTIN 100 MG PO CAPS
200.0000 mg | ORAL_CAPSULE | Freq: Three times a day (TID) | ORAL | Status: DC
  Administered 2014-04-15 – 2014-04-18 (×8): 200 mg via ORAL
  Filled 2014-04-15 (×10): qty 2

## 2014-04-15 MED ORDER — ARIPIPRAZOLE 10 MG PO TABS
10.0000 mg | ORAL_TABLET | Freq: Every day | ORAL | Status: DC
  Administered 2014-04-15 – 2014-04-17 (×3): 10 mg via ORAL
  Filled 2014-04-15 (×4): qty 1

## 2014-04-15 MED ORDER — METOPROLOL TARTRATE 50 MG PO TABS
50.0000 mg | ORAL_TABLET | Freq: Two times a day (BID) | ORAL | Status: DC
  Administered 2014-04-15 – 2014-04-18 (×6): 50 mg via ORAL
  Filled 2014-04-15 (×7): qty 1

## 2014-04-15 MED ORDER — FOLIC ACID 1 MG PO TABS
1.0000 mg | ORAL_TABLET | Freq: Every day | ORAL | Status: DC
  Administered 2014-04-15 – 2014-04-18 (×4): 1 mg via ORAL
  Filled 2014-04-15 (×4): qty 1

## 2014-04-15 MED ORDER — DULOXETINE HCL 30 MG PO CPEP
30.0000 mg | ORAL_CAPSULE | Freq: Every day | ORAL | Status: DC
  Administered 2014-04-15 – 2014-04-18 (×4): 30 mg via ORAL
  Filled 2014-04-15 (×4): qty 1

## 2014-04-15 MED ORDER — LORAZEPAM 2 MG/ML IJ SOLN: 1.0000 mg | Freq: Four times a day (QID) | INTRAMUSCULAR | Status: DC | PRN

## 2014-04-15 MED ORDER — TRAZODONE HCL 100 MG PO TABS
200.0000 mg | ORAL_TABLET | Freq: Every day | ORAL | Status: DC
  Filled 2014-04-15: qty 2

## 2014-04-15 MED ORDER — LORAZEPAM 2 MG/ML IJ SOLN: 0.0000 mg | Freq: Four times a day (QID) | INTRAMUSCULAR | Status: DC

## 2014-04-15 MED ORDER — GI COCKTAIL ~~LOC~~
30.0000 mL | Freq: Three times a day (TID) | ORAL | Status: DC | PRN
  Administered 2014-04-16: 30 mL via ORAL
  Filled 2014-04-15 (×2): qty 30

## 2014-04-15 NOTE — H&P (Addendum)
[facility] History and Physical  [name] CBU:[id number] DOB: Feb 28, 1981 DOA: 04/15/2014  Referring physician: Dr. Sabra [name] PCP: [name] Ewings, [name]   Chief Complaint: N/V, inability to keep things down and CP  HPI: [name] is a 33 y.o. male with PMH significant for HTN, HLD, tobacco abuse, polysubstance abuse (cocaine, alcohol, marijuana), depression, schizophrenia and renal cell carcinoma (s/p left nephrectomy); came to ED complaining of nausea, vomiting, epigastric/chest pain and inability to keep things down. He reports abruptly stopping alcohol consumption approx 2-3 days prior to admission and has been feeling awful since then. Patient reports multiple vomiting episodes (no blood); and after vomiting is now having CP (non radiated, no associated, palpitation, no diaphoresis). Patient endorses mild vision changes, seeing black dots and small ants (had hx of withdrawal and hallucinations in the past). Patient denies fever, chills, cough, hematochezia and melena. In ED was found to be mildly dehydrated, tachycardic and with UA suggesting UTI. [facility] called to admit patient for further evaluation, tx and assistance with detox.    Review of Systems:  Negative except as otherwise mentioned on HPI.  Past Medical History  Diagnosis Date  . Seizures   . Hypertension   . Depression   . Pancreatitis   . Coronary artery disease   . Angina   . Shortness of breath   . Headache(784.0)   . Peripheral vascular disease April 2011    Left Pop  . Hypercholesteremia   . Schizophrenia   . Bipolar 1 disorder   . Cancer of kidney dx'd 04/2013    lt nephrectomy  . Breast CA dx'd 2009    bil w/ bil masectomy and oral meds   Past Surgical History  Procedure Laterality Date  . Chest surgery    . Left leg surgery    . Mastectomy    . Left kidney removal    . Breast surgery     Social History:  reports that he has been smoking Cigarettes.  He has a .4 pack-year smoking history. He has  never used smokeless tobacco. His alcohol and drug histories are not on file.  Allergies  Allergen Reactions  . Codeine Hives, Itching and Swelling  . Depakote [Divalproex Sodium] Other (See Comments)    "Bug out and hallucinate"  . Morphine Itching  . Penicillins Swelling  . Hydrocodone-Acetaminophen Itching and Rash    Family History  Problem Relation Age of Onset  . Stroke Other      Prior to Admission medications   Medication Sig Start Date End Date Taking? Authorizing Provider  amLODipine (NORVASC) 5 MG tablet Take 1 tablet (5 mg total) by mouth daily. 02/06/14  Yes [name] Mola, [name]  ARIPiprazole (ABILIFY) 10 MG tablet Take 1 tablet (10 mg total) by mouth at bedtime. 02/06/14  Yes [name] Mola, [name]  DULoxetine (CYMBALTA) 30 MG capsule Take 1 capsule (30 mg total) by mouth daily. 02/06/14  Yes [name] Mola, [name]  gabapentin (NEURONTIN) 100 MG capsule Take 2 capsules (200 mg total) by mouth 3 (three) times daily. 02/06/14  Yes [name] Mola, [name]  metoprolol (LOPRESSOR) 50 MG tablet Take 1 tablet (50 mg total) by mouth 2 (two) times daily. 02/06/14  Yes [name] Mola, [name]  mirtazapine (REMERON SOL-TAB) 15 MG disintegrating tablet Take 1 tablet (15 mg total) by mouth at bedtime. 02/06/14  Yes [name] Mola, [name]  simvastatin (ZOCOR) 40 MG tablet Take 1 tablet (40 mg total) by mouth daily. 02/06/14  Yes [name] Mola,  [name]  SUMAtriptan (IMITREX) 25 MG tablet Take 1 tablet (25 mg total) by mouth every 2 (two) hours as needed for migraine or headache. May repeat in 2 hours if headache persists or recurs. 02/06/14  Yes [name] Mola, [name]  traZODone (DESYREL) 100 MG tablet Take 2 tablets (200 mg total) by mouth at bedtime. 02/06/14  Yes [name] Mola, [name]   Physical Exam: Filed Vitals:   04/15/14 1600 04/15/14 1615 04/15/14 1630 04/15/14 1631  BP: 135/97 127/92 132/91 132/91  Pulse: 109 115 107 117  Temp:      TempSrc:      Resp: 17 17 20 19   Height:      Weight:      SpO2: 96%  98% 95% 97%    Wt Readings from Last 3 Encounters:  04/15/14 74.844 kg (165 lb)  01/28/14 76.204 kg (168 lb)  10/19/13 72.576 kg (160 lb)    General:  Appears calm, denies further vomiting; still nauseous and complaining of epigastric discomfort. No fever Eyes: PERRL, normal lids, irises & conjunctiva; no icterus or nystagmus ENT: grossly normal hearing, slight dry MM, no erythema, exudates or thrush; no drainage out of ears or nostrils Neck: no LAD, masses or thyromegaly, no JVD Cardiovascular: tachycardic, no rubs or gallops; S1 and S2. Telemetry: tachycardic, sinus. Respiratory: CTA bilaterally, no w/r/r. Normal respiratory effort.  Abdomen: soft, tender to palpation epigastric area, ND Skin: no rash or induration seen on exam Musculoskeletal: no swelling, no erythema, no LE pain; FROM Psychiatric: grossly normal mood and affect, speech fluent and appropriate Neurologic: grossly non-focal.          Labs on Admission:  Basic Metabolic Panel:  Recent Labs Lab 04/15/14 1134  NA 137  K 4.4  CL 94*  CO2 26  GLUCOSE 84  BUN 10  CREATININE 0.90  CALCIUM 9.9   Liver Function Tests:  Recent Labs Lab 04/15/14 1134  AST 411*  ALT 316*  ALKPHOS 74  BILITOT 1.3*  PROT 8.3  ALBUMIN 4.4    Recent Labs Lab 04/15/14 1216  LIPASE 59   CBC:  Recent Labs Lab 04/15/14 1134  WBC 3.3*  NEUTROABS 2.2  HGB 15.4  HCT 42.4  MCV 89.3  PLT 68*   Radiological Exams on Admission: Dg Chest 2 View  04/15/2014   CLINICAL DATA:  Mid chest pain with 2 days of nausea and vomiting  EXAM: CHEST  2 VIEW  COMPARISON:  Chest x-ray from an acute abdominal series of January 31, 2014  FINDINGS: The lungs are adequately inflated. There is no focal infiltrate. The heart and pulmonary vascularity are within the limits of normal. There is no pleural effusion or pneumothorax. The bony thorax is unremarkable.  IMPRESSION: There is no acute cardiopulmonary abnormality.   Electronically Signed    By: [name]  [name]   On: 04/15/2014 12:50    EKG: Ventricular Rate: 126  PR Interval: 114  QRS Duration: 82  QT Interval: 308  QTC Calculation: 446  R Axis: 82  Text Interpretation: Sinus tachycardia, Nonspecific T wave abnormality.    Assessment/Plan 1-Alcohol dependence with withdrawal:patient coming to ED complaining of nausea, vomiting, inability to keep things down and experiencing already some visual hallucinations (seen black dots, and tiny ants) -will admit to telemetry -CIWA protocol, thiamine and folic acid -IVF's -supportive care -PRN antiemetics -cessation counseling provided -social worker consulted, patient want detox. -will check B12  2-chest pain: mainly epigastric area and present after been vomiting for while;  most likely GERD. But given hx of HTN, HLD and use of cocaine will cycle troponin and check 2-D echo -cycle troponin  -will check 2-D echo -CXR w/o infiltrates -will start carafate and PPI -given the fact of elevated d-dimer, plus hx of cancer and potential hypercoagulability will check CTA  3-thrombocytopenia: most likely due to alcohol -no signs of acute bleeding -will avoid heparin products -check peripheral smear -follow platelets trend  4-HYPERTENSION: continue current medications  5-SEIZURE DISORDER, hx: but not on antiepileptics -supposedly associated to withdrawals -will monitor for seizure  6-Major depressive disorder, recurrent episode: continue trazodone, abilify, remeron and cymbalta  7-HLD: continue statins -will check lipid profile  8-transaminitis: due to alcohol abuse -will monitor trend  9-Tobacco abuse: will start nicotine patch. -cessation counseling provided  10-UTI: UA with positive LA and nitrites. Patient seports increase frequency; no frank dysuria -will check urine cx -will start empiric treatment with cipro  11-GERD: will use PPI, carafate and PRN GI cocktail  Code Status: full DVT Prophylaxis: SCD's Family  Communication: no family at bedside  Disposition Plan: LOS > 2 midnights, inpatient, telemetry bed  Time spent: 50 minutes  Barton Dubois [facility] Pager [phone]

## 2014-04-15 NOTE — Progress Notes (Signed)
Pt admitted to the unit at 1722. Pt mental status is alert and oriented x4. Pt oriented to room, staff, and call bell. Skin is intact. Full assessment charted in [facility]. Call bell within reach. Visitor guidelines reviewed w/ pt and/or family.

## 2014-04-15 NOTE — ED Notes (Signed)
Pt states he stopped drinking alcohol on Saturday.  Pt is diaphoretic with N/V.

## 2014-04-15 NOTE — Progress Notes (Signed)
Attempted to call ED for report

## 2014-04-15 NOTE — ED Provider Notes (Signed)
Medical screening examination/treatment/procedure(s) were conducted as a shared visit with non-physician practitioner(s) and myself.  I personally evaluated the patient during the encounter  Please see my separate respective documentation pertaining to this patient encounter   [name] Acosta, MD 04/15/14 [name]

## 2014-04-15 NOTE — ED Provider Notes (Signed)
33 year old male, history of heavy alcohol use, daily 4-5 bottles of wine, associated to crack cocaine use twice a week, last alcohol and cocaine 2 days ago. Since that time he has had intense and severe nausea and vomiting with associated sharp chest pain. He is unable to hold any fluids and is starting to have shakes and seeing black spots. He denies any other hallucinations either auditory or visual and has no confusion. He has had delirium tremens and withdrawal seizures in the past. He states that he wants to stop drinking because he is tired of what is going to his body and is tired of waiting and nauseated every morning. On exam the patient has no conjunctival icterus, clear mucous membranes which appear slightly dehydrated, mild tachycardia to 105 beats per minute with strong pulses and no peripheral edema, and diffusely mildly tender abdomen which is nonfocal and non-peritoneal. The patient is likely having alcohol withdrawal symptoms, the question of what is causing his chest pain is unclear and the patient will need further evaluation with testing including EKG and a d-dimer as well as liver function tests and renal function tests (has only one kidney after renal cell carcinoma according the patient). Ativan given orally prior to his emergency department bed placement, he will need intravenous Ativan as well as IV fluids at this time.  At the time of admission, the patient has an elevated D dimer - CT Angio ordered  Medical screening examination/treatment/procedure(s) were conducted as a shared visit with non-physician practitioner(s) and myself.  I personally evaluated the patient during the encounter.  Clinical Impression:   Final diagnoses:  Alcohol withdrawal, with unspecified complication  Elevated liver enzymes  Cocaine use         [name], MD 04/15/14 [name]

## 2014-04-15 NOTE — ED Provider Notes (Signed)
CSN: [id number]     Arrival date & time 04/15/14  1014 History   First MD Initiated Contact with Patient 04/15/14 1116     Chief Complaint  Patient presents with  . Alcohol Problem     (Consider location/radiation/quality/duration/timing/severity/associated sxs/prior Treatment) HPI Comments: The patient is a 33 year-old male past history of alcohol abuse, schizophrenia, bipolar, kidney removal, CAD presenting to emergency room chief complaint of EtOH withdrawal symptoms and requesting EtOH detox. The patient reports last EtOH use approximately 2 days ago. Patient reports every day alcohol use for the past 2 months, stating 4 bottles of wine daily. He reports history of detox and a seizure from withdrawal last year. Last seizure 1 year ago. He reports onset of tremors yesterday. The patient reports multiple episodes of vomiting for 2 days, denies hematemesis. Unable to eat or drink.  The pateint reports generalized muscular cramping. He also reports crack cocaine use 2 days ago.  Denies IV drug use, other drug use or mis use of prescription medication. The patient also reports chest pain, lower substernal, nonradiating, worsened with exertion, self resolved. Reports associated shortness of breath. When asked why the patient is requesting detox, the patient states he is "tired of it" and it is "taken a toll on my body". PCP: Elbert Ewings, FNP  Patient is a 33 y.o. male presenting with alcohol problem. The history is provided by the patient and a parent. No language interpreter was used.  Alcohol Problem Associated symptoms include abdominal pain, chest pain, diaphoresis, nausea and vomiting. Pertinent negatives include no fever.    Past Medical History  Diagnosis Date  . Seizures   . Hypertension   . Depression   . Pancreatitis   . Liver cirrhosis   . Coronary artery disease   . Angina   . Shortness of breath   . Headache(784.0)   . Peripheral vascular disease April 2011    Left Pop  .  Hypercholesteremia   . Schizophrenia   . Bipolar 1 disorder   . Cancer of kidney dx'd 04/2013    lt nephrectomy  . Breast CA dx'd 2009    bil w/ bil masectomy and oral meds   Past Surgical History  Procedure Laterality Date  . Chest surgery    . Left leg surgery    . Mastectomy    . Left kidney removal    . Breast surgery     Family History  Problem Relation Age of Onset  . Stroke Other    History  Substance Use Topics  . Smoking status: Current Every Day Smoker -- 0.10 packs/day for 4 years    Types: Cigarettes  . Smokeless tobacco: Never Used  . Alcohol Use: Not on file     Comment: at [facility] for ETOH and cocaine    Review of Systems  Constitutional: Positive for diaphoresis. Negative for fever.  Respiratory: Positive for shortness of breath.   Cardiovascular: Positive for chest pain and palpitations. Negative for leg swelling.  Gastrointestinal: Positive for nausea, vomiting and abdominal pain.  Neurological: Positive for tremors.  Psychiatric/Behavioral: Negative for hallucinations.      Allergies  Codeine; Depakote; Morphine; Penicillins; and Hydrocodone-acetaminophen  Home Medications   Prior to Admission medications   Medication Sig Start Date End Date Taking? Authorizing Provider  acamprosate (CAMPRAL) 333 MG tablet Take 2 tablets (666 mg total) by mouth 3 (three) times daily with meals. 02/06/14   Benjamine Mola, FNP  amLODipine (NORVASC) 5 MG tablet Take 1 tablet (  5 mg total) by mouth daily. 02/06/14   Benjamine Mola, FNP  ARIPiprazole (ABILIFY) 10 MG tablet Take 1 tablet (10 mg total) by mouth at bedtime. 02/06/14   Benjamine Mola, FNP  DULoxetine (CYMBALTA) 30 MG capsule Take 1 capsule (30 mg total) by mouth daily. 02/06/14   Benjamine Mola, FNP  gabapentin (NEURONTIN) 100 MG capsule Take 2 capsules (200 mg total) by mouth 3 (three) times daily. 02/06/14   Benjamine Mola, FNP  metoprolol (LOPRESSOR) 50 MG tablet Take 1 tablet (50 mg total) by mouth 2 (two)  times daily. 02/06/14   Benjamine Mola, FNP  mirtazapine (REMERON SOL-TAB) 15 MG disintegrating tablet Take 1 tablet (15 mg total) by mouth at bedtime. 02/06/14   Benjamine Mola, FNP  simvastatin (ZOCOR) 40 MG tablet Take 1 tablet (40 mg total) by mouth daily. 02/06/14   Benjamine Mola, FNP  SUMAtriptan (IMITREX) 25 MG tablet Take 1 tablet (25 mg total) by mouth every 2 (two) hours as needed for migraine or headache. May repeat in 2 hours if headache persists or recurs. 02/06/14   Benjamine Mola, FNP  traZODone (DESYREL) 100 MG tablet Take 2 tablets (200 mg total) by mouth at bedtime. 02/06/14   Elyse Jarvis Withrow, FNP   BP 131/106  Pulse 125  Temp(Src) 98.1 F (36.7 C) (Oral)  Resp 26  Ht 5\' 6"  (1.676 m)  Wt 165 lb (74.844 kg)  BMI 26.64 kg/m2  SpO2 93% Physical Exam  Vitals reviewed. Constitutional: He is oriented to person, place, and time. He appears well-developed and well-nourished. No distress.  HENT:  Head: Normocephalic and atraumatic.  Mouth/Throat: Oropharynx is clear and moist.  Eyes: No scleral icterus.  Neck: Neck supple.  Cardiovascular: Regular rhythm.  Tachycardia present.   No lower extremity edema  Pulmonary/Chest: Effort normal. He has no decreased breath sounds. He has no wheezes. He has no rhonchi. He has no rales.  Abdominal: Soft. Normal appearance. There is generalized tenderness. There is no rebound and no guarding.  Neurological: He is alert and oriented to person, place, and time. He is not disoriented. He displays tremor. GCS eye subscore is 4. GCS verbal subscore is 5. GCS motor subscore is 6.  Skin: Skin is warm and dry.  Psychiatric: He has a normal mood and affect. His behavior is normal.    ED Course  Procedures (including critical care time) Labs Review Results for orders placed during the hospital encounter of 04/15/14  CBC WITH DIFFERENTIAL      Result Value Ref Range   WBC 3.3 (*) 4.0 - 10.5 K/uL   RBC 4.75  4.22 - 5.81 MIL/uL   Hemoglobin 15.4   13.0 - 17.0 g/dL   HCT 42.4  39.0 - 52.0 %   MCV 89.3  78.0 - 100.0 fL   MCH 32.4  26.0 - 34.0 pg   MCHC 36.3 (*) 30.0 - 36.0 g/dL   RDW 14.7  11.5 - 15.5 %   Platelets 68 (*) 150 - 400 K/uL   Neutrophils Relative % 67  43 - 77 %   Neutro Abs 2.2  1.7 - 7.7 K/uL   Lymphocytes Relative 18  12 - 46 %   Lymphs Abs 0.6 (*) 0.7 - 4.0 K/uL   Monocytes Relative 14 (*) 3 - 12 %   Monocytes Absolute 0.5  0.1 - 1.0 K/uL   Eosinophils Relative 0  0 - 5 %   Eosinophils Absolute 0.0  0.0 - 0.7 K/uL   Basophils Relative 1  0 - 1 %   Basophils Absolute 0.0  0.0 - 0.1 K/uL  COMPREHENSIVE METABOLIC PANEL      Result Value Ref Range   Sodium 137  137 - 147 mEq/L   Potassium 4.4  3.7 - 5.3 mEq/L   Chloride 94 (*) 96 - 112 mEq/L   CO2 26  19 - 32 mEq/L   Glucose, Bld 84  70 - 99 mg/dL   BUN 10  6 - 23 mg/dL   Creatinine, Ser 0.90  0.50 - 1.35 mg/dL   Calcium 9.9  8.4 - 10.5 mg/dL   Total Protein 8.3  6.0 - 8.3 g/dL   Albumin 4.4  3.5 - 5.2 g/dL   AST 411 (*) 0 - 37 U/L   ALT 316 (*) 0 - 53 U/L   Alkaline Phosphatase 74  39 - 117 U/L   Total Bilirubin 1.3 (*) 0.3 - 1.2 mg/dL   GFR calc non Af Amer >90  >90 mL/min   GFR calc Af Amer >90  >90 mL/min   Anion gap 17 (*) 5 - 15  SALICYLATE LEVEL      Result Value Ref Range   Salicylate Lvl <2.6 (*) 2.8 - 20.0 mg/dL  ACETAMINOPHEN LEVEL      Result Value Ref Range   Acetaminophen (Tylenol), Serum <15.0  10 - 30 ug/mL  ETHANOL      Result Value Ref Range   Alcohol, Ethyl (B) <11  0 - 11 mg/dL  URINALYSIS, ROUTINE W REFLEX MICROSCOPIC      Result Value Ref Range   Color, Urine RED (*) YELLOW   APPearance CLOUDY (*) CLEAR   Specific Gravity, Urine 1.030  1.005 - 1.030   pH 8.0  5.0 - 8.0   Glucose, UA NEGATIVE  NEGATIVE mg/dL   Hgb urine dipstick NEGATIVE  NEGATIVE   Bilirubin Urine SMALL (*) NEGATIVE   Ketones, ur 40 (*) NEGATIVE mg/dL   Protein, ur 100 (*) NEGATIVE mg/dL   Urobilinogen, UA 1.0  0.0 - 1.0 mg/dL   Nitrite POSITIVE (*)  NEGATIVE   Leukocytes, UA SMALL (*) NEGATIVE  URINE RAPID DRUG SCREEN (HOSP PERFORMED)      Result Value Ref Range   Opiates NONE DETECTED  NONE DETECTED   Cocaine POSITIVE (*) NONE DETECTED   Benzodiazepines POSITIVE (*) NONE DETECTED   Amphetamines NONE DETECTED  NONE DETECTED   Tetrahydrocannabinol POSITIVE (*) NONE DETECTED   Barbiturates NONE DETECTED  NONE DETECTED  LIPASE, BLOOD      Result Value Ref Range   Lipase 59  11 - 59 U/L  URINE MICROSCOPIC-ADD ON      Result Value Ref Range   Squamous Epithelial / LPF RARE  RARE   WBC, UA 3-6  <3 WBC/hpf   RBC / HPF 0-2  <3 RBC/hpf   Bacteria, UA RARE  RARE  I-STAT TROPOININ, ED      Result Value Ref Range   Troponin i, poc 0.00  0.00 - 0.08 ng/mL   Comment 3            Dg Chest 2 View  04/15/2014   CLINICAL DATA:  Mid chest pain with 2 days of nausea and vomiting  EXAM: CHEST  2 VIEW  COMPARISON:  Chest x-ray from an acute abdominal series of January 31, 2014  FINDINGS: The lungs are adequately inflated. There is no focal infiltrate. The heart and pulmonary vascularity are  within the limits of normal. There is no pleural effusion or pneumothorax. The bony thorax is unremarkable.  IMPRESSION: There is no acute cardiopulmonary abnormality.   Electronically Signed   By: [name]  Martinique   On: 04/15/2014 12:50   Imaging Review No results found.   EKG Interpretation   Date/Time:  Monday April 15 2014 10:35:36 EDT Ventricular Rate:  126 PR Interval:  114 QRS Duration: 82 QT Interval:  308 QTC Calculation: 446 R Axis:   82 Text Interpretation:  Sinus tachycardia Nonspecific T wave abnormality  Abnormal ECG Since last tracing rate faster and T wave abnormalities now  present Confirmed by MILLER  MD, BRIAN ([id number]) on 04/15/2014 4:29:24 PM      MDM   Final diagnoses:  Alcohol withdrawal, with unspecified complication  Elevated liver enzymes  Cocaine use   The patient is a 33 year old male presents with EtOH withdrawal  symptoms, requesting detox and chest pain with recent history of cocaine use. Chest discomfort likely due to persistent vomiting.  Labs, EKG, thiamine, 2 L NS ordered. Patient received 2 mg Ativan, zofran orally reports vomiting. Dr. Sabra Heck also evaluated the patient during this encounter, advised additional 2mg  ativan IV. CIWA protocol.  Paged Hospitalist Re-paged hospitalist.  [phone] Discussed pt history with Dr. Dyann Kief who agrees to admit the patient.  Meds given in ED:  Medications  LORazepam (ATIVAN) tablet 0-4 mg (2 mg Oral Given 04/15/14 1342)  LORazepam (ATIVAN) tablet 0-4 mg (not administered)  LORazepam (ATIVAN) injection 0-4 mg (not administered)  LORazepam (ATIVAN) injection 0-4 mg (not administered)  thiamine (VITAMIN B-1) tablet 100 mg (not administered)  thiamine (B-1) injection 100 mg (not administered)  ondansetron (ZOFRAN-ODT) disintegrating tablet 4 mg (4 mg Oral Given 04/15/14 1047)  LORazepam (ATIVAN) tablet 2 mg (2 mg Oral Given 04/15/14 1048)  sodium chloride 0.9 % bolus 1,000 mL (0 mLs Intravenous Stopped 04/15/14 1332)  thiamine (B-1) injection 100 mg (100 mg Intravenous Given 04/15/14 1223)  LORazepam (ATIVAN) injection 2 mg (2 mg Intravenous Given 04/15/14 1342)  sodium chloride 0.9 % bolus 1,000 mL (0 mLs Intravenous Stopped 04/15/14 1501)    New Prescriptions   No medications on file     Harvie Heck, PA-C 04/15/14 1631

## 2014-04-16 DIAGNOSIS — F1410 Cocaine abuse, uncomplicated: Secondary | ICD-10-CM | POA: Diagnosis present

## 2014-04-16 DIAGNOSIS — I517 Cardiomegaly: Secondary | ICD-10-CM

## 2014-04-16 DIAGNOSIS — F14288 Cocaine dependence with other cocaine-induced disorder: Secondary | ICD-10-CM | POA: Diagnosis present

## 2014-04-16 LAB — LIPID PANEL
Cholesterol: 264 mg/dL — ABNORMAL HIGH (ref 0–200)
HDL: 72 mg/dL (ref >39)
LDL Cholesterol: 181 mg/dL — ABNORMAL HIGH (ref 0–99)
Total CHOL/HDL Ratio: 3.7 RATIO
Triglycerides: 56 mg/dL (ref <150)
VLDL: 11 mg/dL (ref 0–40)

## 2014-04-16 LAB — TROPONIN I
Troponin I: <0.3 ng/mL (ref <0.30)
Troponin I: <0.3 ng/mL (ref <0.30)

## 2014-04-16 LAB — URINE CULTURE: Colony Count: 50000

## 2014-04-16 LAB — PATHOLOGIST SMEAR REVIEW

## 2014-04-16 LAB — VITAMIN B12: VITAMIN B 12: 749 pg/mL (ref 211–911)

## 2014-04-16 IMAGING — CT CT ANGIO CHEST
2 of 6 series · 19 of 36 positions shown · IV contrast (APPLIED)
Comparison: 04/06/2007

CLINICAL DATA: Rule out pulmonary embolus.

CT ANGIOGRAPHY CHEST
TECHNIQUE: Multidetector CT imaging of the chest using the
standard protocol during bolus administration of intravenous
contrast. Multiplanar reconstructed images including MIPs were
obtained and reviewed to evaluate the vascular anatomy.
Contrast: 100mL OMNIPAQUE IOHEXOL 300 MG/ML  SOLN

[Series 7: pe thins @ 1mm · axial · 0.71mm/px · z∈[-220,+2]mm · 18 of 248 slices shown]
[im 13/248  lung]
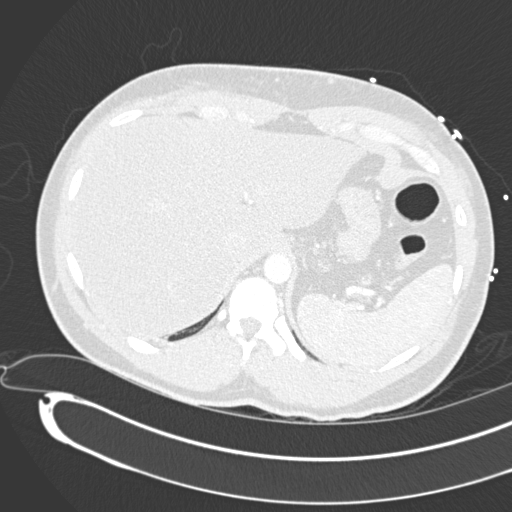
[im 25/248  mediastinal]
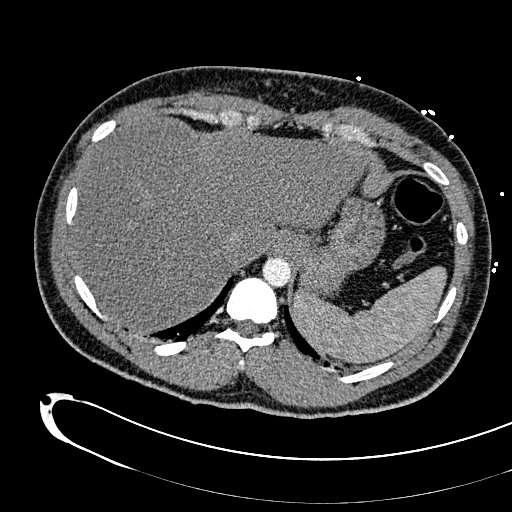
[im 38/248  lung]
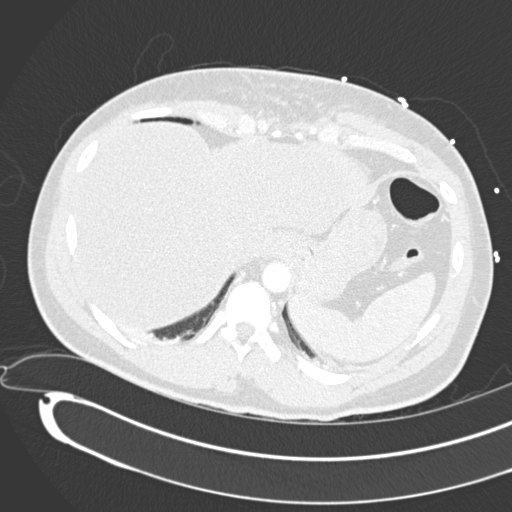
[im 50/248  mediastinal]
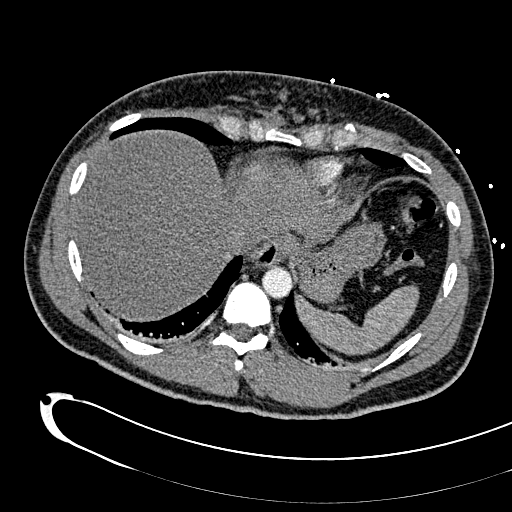
[im 62/248  lung]
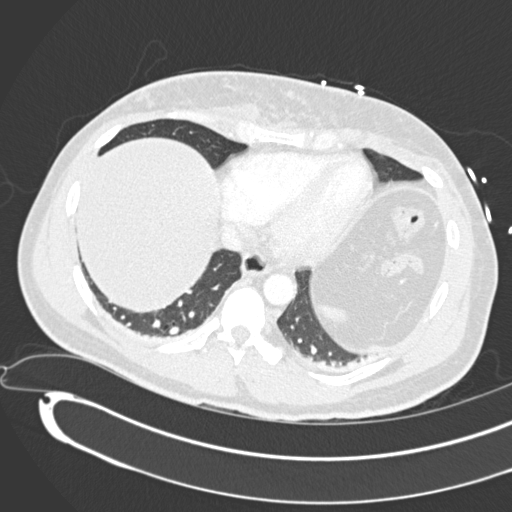
[im 75/248  mediastinal]
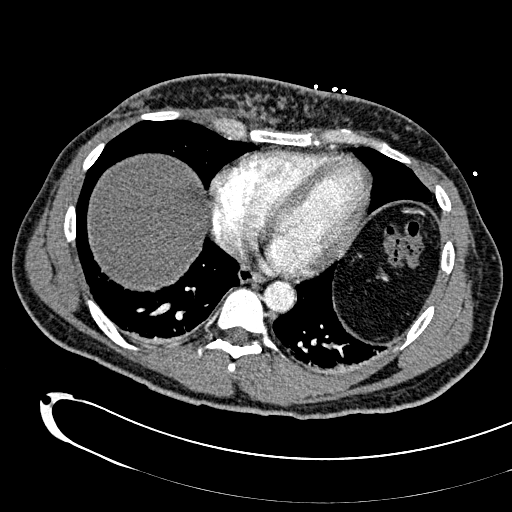
[im 87/248  lung]
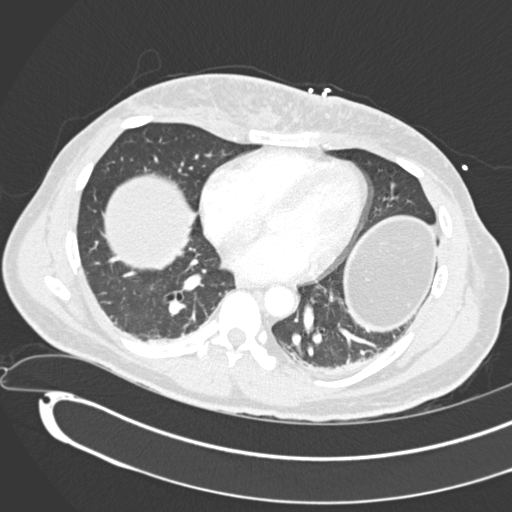
[im 99/248  mediastinal]
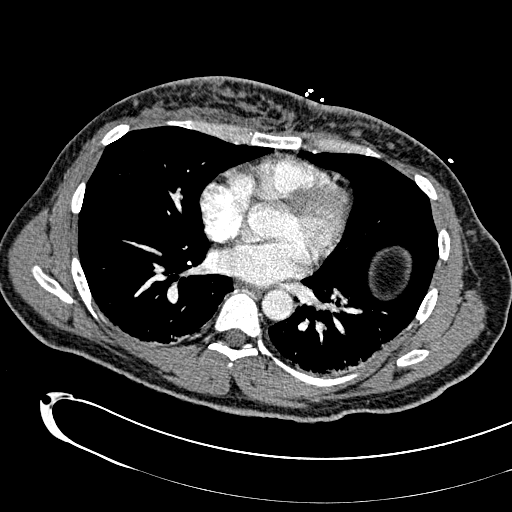
[im 112/248  lung]
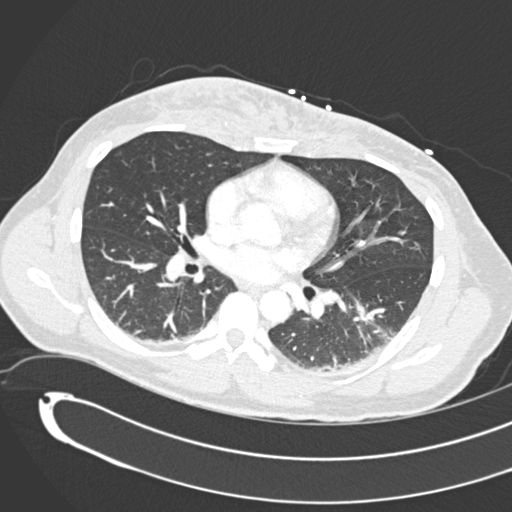
[im 136/248  mediastinal]
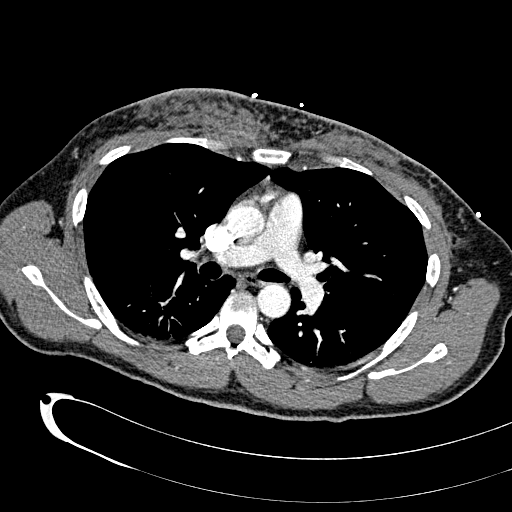
[im 149/248  lung]
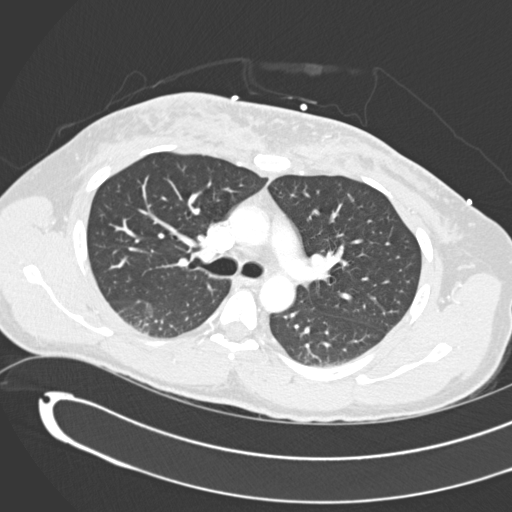
[im 161/248  mediastinal]
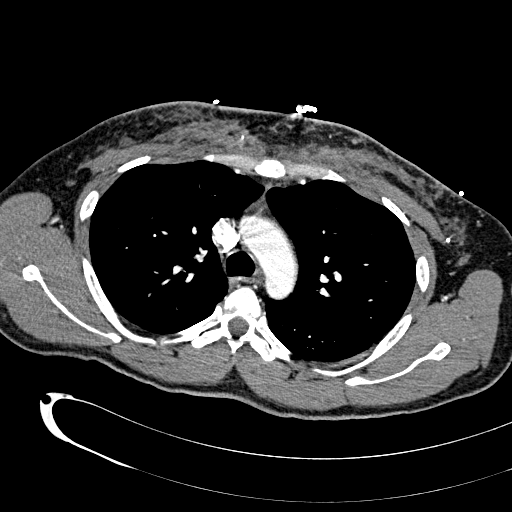
[im 173/248  lung]
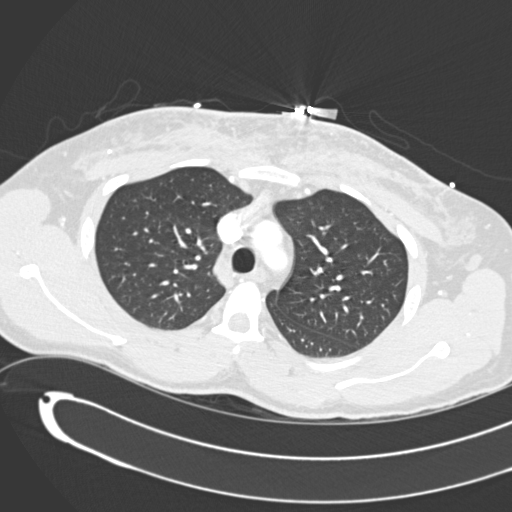
[im 186/248  mediastinal]
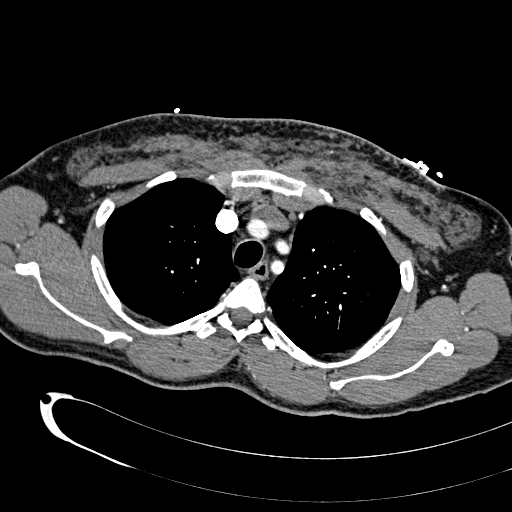
[im 198/248  lung]
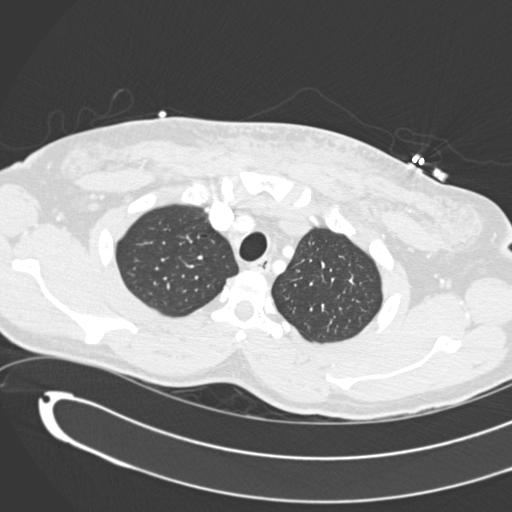
[im 210/248  mediastinal]
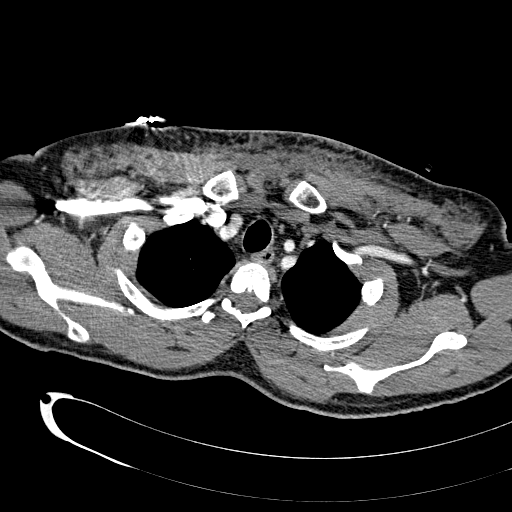
[im 223/248  lung]
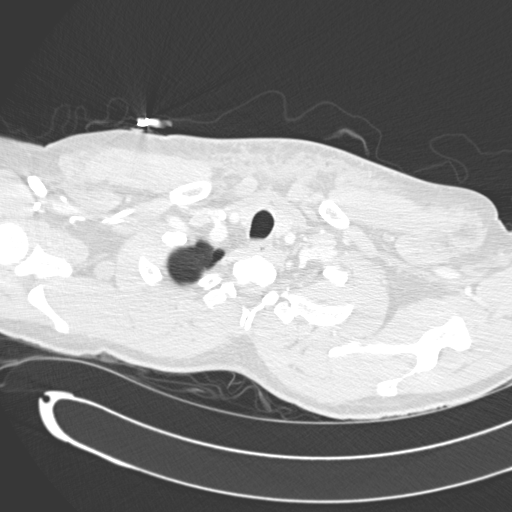
[im 235/248  mediastinal]
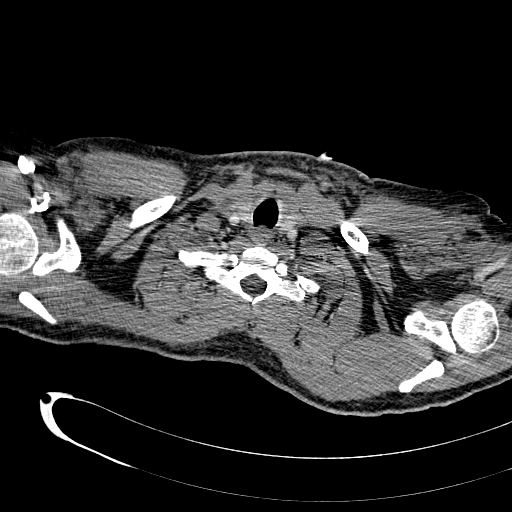

[Series 602: <mpr thick range> · coronal · 0.71mm/px · 1 of 120 slices shown]
[im 60/120  mediastinal]
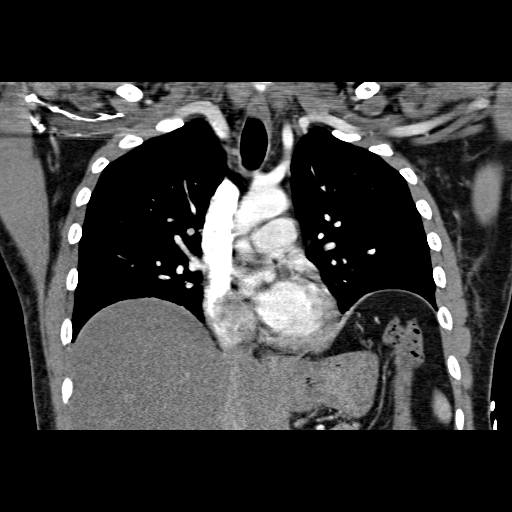

[19 of 36 positions shown; findings below may reference images not displayed]

FINDINGS: There is abnormal skin thickening and subcutaneous fat
stranding overlying the ventral chest wall.  No discrete fluid
collection identified. This may be compatible with cellulitis.

No enlarged axillary or supraclavicular lymph nodes.

There is no enlarged mediastinal or hilar adenopathy.

No pericardial or pleural effusion.

No abnormal filling defects are identified within the main
pulmonary artery or their branches to suggest acute pulmonary
embolus.

Atelectasis is noted in the left base.

No airspace consolidation.

No pulmonary nodule or mass.
IMPRESSION: 1.  Negative for acute pulmonary embolus.
2.  Left base atelectasis.
3.  Abnormal skin thickening and subcutaneous fat stranding
involving the ventral chest wall.  Correlate for any clinical signs
or symptoms of cellulitis.

## 2014-04-16 MED ORDER — BOOST / RESOURCE BREEZE PO LIQD
1.0000 | Freq: Three times a day (TID) | ORAL | Status: DC
  Administered 2014-04-16 – 2014-04-18 (×4): 1 via ORAL

## 2014-04-16 MED ORDER — ZOLPIDEM TARTRATE 5 MG PO TABS
5.0000 mg | ORAL_TABLET | Freq: Every evening | ORAL | Status: DC | PRN
  Administered 2014-04-16 – 2014-04-17 (×2): 5 mg via ORAL
  Filled 2014-04-16 (×2): qty 1

## 2014-04-16 NOTE — Progress Notes (Signed)
Utilization review completed.  

## 2014-04-16 NOTE — Plan of Care (Signed)
To Whom it May Concern: Mr. [name] was admitted to [facility] on 03/2814 and will still be an inpatient on 04/16/14. Therefore, he will not be able to attend his court date on that day.

## 2014-04-16 NOTE — Progress Notes (Addendum)
INITIAL NUTRITION ASSESSMENT  DOCUMENTATION CODES Per approved criteria  -Not Applicable   INTERVENTION: Resource Breeze po TID, each supplement provides 250 kcal and 9 grams of protein  NUTRITION DIAGNOSIS: Inadequate oral intake related to hx of substance abuse as evidenced by diet recall.   Goal: Pt will >90% of estimated nutritional needs  Monitor:  Diet advancement, PO/supplement intake, labs, weight changes, I/O's  Reason for Assessment: MST=3  33 y.o. male  Admitting Dx: Alcohol dependence with withdrawal  [name] is a 33 y.o. male with PMH significant for HTN, HLD, tobacco abuse, polysubstance abuse (cocaine, alcohol, marijuana), depression, schizophrenia and renal cell carcinoma (s/p left nephrectomy); came to ED complaining of nausea, vomiting, epigastric/chest pain and inability to keep things down.  ASSESSMENT: Pt reports good appetite PTA. He reports good tolerance of clear liquid diet presently. He admits to dietary indiscretion, particularly when drinking. He claims he usually "eats pretty healthy" (food frequency recall reveals he eats fish, chicken, [place], and many vegetables; does not eat beef or pork by choice). He reveals that recently he has not been eating 3 meals per day and snacks on cakes, cookies, candy, and ice cream.He reports he drinks Ensure daily; he is agreeable to trying [employer] while on a clear liquid diet. CSW consult for SA rehab.  He is active at baseline- reports he walks one mile daily.  Pt denies wt loss. Reveals UBW is around current wt of 162#.  Educated pt on importance of good PO intake to support healing process. Encouraged supplement intake during hospitalization and after discharge.  Nutrition focused physical exam reveals no signs of fat or muscle depletion. Labs reviewed. Mg, K, and Phos WDL.   Height: Ht Readings from Last 1 Encounters:  04/15/14 5\' 6"  (1.676 m)    Weight: Wt Readings from Last 1 Encounters:   04/15/14 162 lb 11.2 oz (73.8 kg)    Ideal Body Weight: 142#  % Ideal Body Weight: 114%  Wt Readings from Last 10 Encounters:  04/15/14 162 lb 11.2 oz (73.8 kg)  01/28/14 168 lb (76.204 kg)  10/19/13 160 lb (72.576 kg)  09/22/13 160 lb (72.576 kg)  09/20/13 159 lb (72.122 kg)  08/02/13 144 lb 8 oz (65.545 kg)  04/27/13 168 lb (76.204 kg)  04/18/13 160 lb (72.576 kg)  03/28/13 173 lb (78.472 kg)  01/03/13 185 lb 3.2 oz (84.006 kg)    Usual Body Weight: 162#  % Usual Body Weight: 100%  BMI:  Body mass index is 26.27 kg/(m^2). Overweight  Estimated Nutritional Needs: Kcal: 1800-2000 Protein: 89-99 grams Fluid: 1.8-2.0 L  Skin: rt arm ecchymosis  Diet Order: Clear Liquid  EDUCATION NEEDS: -Education needs addressed   Intake/Output Summary (Last 24 hours) at 04/16/14 1458 Last data filed at 04/16/14 1051  Gross per 24 hour  Intake    597 ml  Output    900 ml  Net   -303 ml    Last BM: 04/15/14  Labs:   Recent Labs Lab 04/15/14 1134 04/15/14 2003  NA 137  --   K 4.4  --   CL 94*  --   CO2 26  --   BUN 10  --   CREATININE 0.90  --   CALCIUM 9.9  --   MG  --  1.6  PHOS  --  2.3  GLUCOSE 84  --     CBG (last 3)  No results found for this basename: GLUCAP,  in the last 72 hours  Scheduled Meds: . amLODipine  5 mg Oral Daily  . ARIPiprazole  10 mg Oral QHS  . atorvastatin  20 mg Oral q1800  . ciprofloxacin  400 mg Intravenous Q12H  . DULoxetine  30 mg Oral Daily  . folic acid  1 mg Oral Daily  . gabapentin  200 mg Oral TID  . LORazepam  0-4 mg Intravenous Q6H   Followed by  . [START ON 04/17/2014] LORazepam  0-4 mg Intravenous Q12H  . metoprolol  50 mg Oral BID  . mirtazapine  15 mg Oral QHS  . multivitamin with minerals  1 tablet Oral Daily  . nicotine  14 mg Transdermal Daily  . pantoprazole  40 mg Oral Q1200  . sodium chloride  3 mL Intravenous Q12H  . sucralfate  1 g Oral TID WC & HS  . thiamine  100 mg Oral Daily    Continuous  Infusions: . sodium chloride 1,000 mL (04/16/14 1044)    Past Medical History  Diagnosis Date  . Seizures   . Hypertension   . Depression   . Pancreatitis   . Liver cirrhosis   . Coronary artery disease   . Angina   . Shortness of breath   . Headache(784.0)   . Peripheral vascular disease April 2011    Left Pop  . Hypercholesteremia   . Schizophrenia   . Bipolar 1 disorder   . Cancer of kidney dx'd 04/2013    lt nephrectomy  . Breast CA dx'd 2009    bil w/ bil masectomy and oral meds    Past Surgical History  Procedure Laterality Date  . Chest surgery    . Left leg surgery    . Mastectomy    . Left kidney removal    . Breast surgery      [name] A. Jimmye Norman, RD, LDN Pager: [phone] After hours Pager: [phone]

## 2014-04-16 NOTE — Progress Notes (Addendum)
PROGRESS NOTE  [name] GHW:[id number] DOB: 12-05-1980 DOA: 04/15/2014 PCP: Elbert Ewings, FNP  HPI/Subjective: [name] is a 33 y.o. male with PMH significant for HTN, HLD, tobacco abuse, polysubstance abuse (cocaine, alcohol, marijuana), depression, schizophrenia and renal cell carcinoma (s/p left nephrectomy); came to ED complaining of nausea, vomiting, epigastric/chest pain and inability to keep things down. He reports abruptly stopping alcohol consumption approx 2-3 days prior to admission and has been feeling awful since then.  Additional history- Patient stated he has been vomiting about once per day for the past month. Patient also stated he has attempted suicide 16 times, with the last attempt 3 months ago. Currently not suicidal.  9/29 Patient stated he had nausea, vomiting, and diarrhea last night; no blood.  Has since resolved. His appetite and energy is low. Tremors in his hands. Still having substernal chest pain (described as sharp, 5 out of 10), and mild dull abdominal pain. Chest pain is constant while at rest and increases with heavy breathing. Currently has 5 out of 10 headache and occasionally seeing spots. Denies fever, pain radiating to back, jaw, and shoulders.   Assessment/Plan:  Alcohol dependence with withdrawal: -complaints of nausea, vomiting several times each day since "Sunday -Last day of alcohol on Saturday 9/26 -Diarrhea first time, evening of 9/29, 3 times -CIWA protocol, thiamine and folic acid, Ativan, IVF's  -cessation counseling provided.  social worker consulted, patient wants detox.  -B12 level normal  Chest pain -substernal and epigastric region, after excessive vomiting. Possibly from GERD vs musculoskeletal -Serial troponin levels negative. -2-D echo showed no significant changes to prior echo in 2013. Systolic function normal, estimated EF of 55 to 60% -CXR w/o infiltrates, -CTA showed no evidence of PE.  -carafate and PPI -Limit pain meds  due to nephrectomy history   Thrombocytopenia -most likely due to alcohol  -no signs of acute bleeding  -will avoid heparin products  -check peripheral smear    HYPERTENSION -currently stable -Is currently on metoprolol.  Will re-evaluate prior to d/c as he uses cocaine.   SEIZURE DISORDER  -Not on antiepileptics  -Might be due to alcohol withdrawal. -will monitor for seizures   Major depressive disorder -recurrent episode: continue abilify, remeron and cymbalta  -Trazadone gives nightmares  HLD -Cholesterol high at 264. Decreased from last year. -continue Lipitor on discharge-STOP for now given elevated LFT's  Transaminitis -due to alcohol abuse.  Acute Hep panel negative in March 2015.  HIV negative in March 2015 -will monitor trend   Tobacco abuse -nicotine patch.  -cessation counseling provided   UTI -UA with positive LE and nitrites.  -Patient complaint of increased urination. No dysuria. -Urine cx pending-if positive then can consider Abx   GERD  -Tolerated clear liquids well morning of 9/29 -No increased pain, no n/v. -carafate and PRN GI cocktail   DVT Prophylaxis:  SCD's  Code Status: Full Family Communication: No family at bedside. Mother periodically visits. Disposition Plan: to home when appropriate.   Consultants:  None  Procedures:  None  Antibiotics: Anti-infectives   Start     Dose/Rate Route Frequency Ordered Stop   04/15/14 2000  ciprofloxacin (CIPRO) IVPB 400 mg     40" 0 mg 200 mL/hr over 60 Minutes Intravenous Every 12 hours 04/15/14 1840        Objective: Filed Vitals:   04/15/14 2138 04/16/14 0047 04/16/14 0531 04/16/14 1300  BP: 132/96 132/97 126/80 138/94  Pulse: 100 85 84 87  Temp: 98.8 F (37.1 C) 98.6 F (37  C) 98.8 F (37.1 C) 98.3 F (36.8 C)  TempSrc: Oral Oral Oral Oral  Resp: 20 20 20 18   Height:      Weight:      SpO2: 98% 97% 96% 97%    Intake/Output Summary (Last 24 hours) at 04/16/14 1458 Last  data filed at 04/16/14 1051  Gross per 24 hour  Intake    597 ml  Output    900 ml  Net   -303 ml   Filed Weights   04/15/14 1035 04/15/14 1734  Weight: 74.844 kg (165 lb) 73.8 kg (162 lb 11.2 oz)    Exam: General: Well developed, well nourished, NAD, appears stated age  33:  EOMI, Anicteic Sclera, MMM. No pharyngeal erythema or exudates  Neck: Supple, no JVD, no masses, no carotid bruits Cardiovascular: RRR, S1 S2 auscultated, no rubs, murmurs or gallops.   Respiratory: Clear to auscultation bilaterally with equal chest rise  Abdomen: Soft, diffuse tenderness of upper abdomen in center and right regions, + bowel sounds  Extremities: warm dry without cyanosis clubbing or edema, 2+ pulses of DP, PT.  Hands tremulous on extension. Neuro: AAOx3, cranial nerves grossly intact. Strength 5/5 in upper and lower extremities  Skin: Without rashes exudates or nodules.   Psych: Normal affect and demeanor with intact judgement and insight   Data Reviewed: Basic Metabolic Panel:  Recent Labs Lab 04/15/14 1134 04/15/14 2003  NA 137  --   K 4.4  --   CL 94*  --   CO2 26  --   GLUCOSE 84  --   BUN 10  --   CREATININE 0.90  --   CALCIUM 9.9  --   MG  --  1.6  PHOS  --  2.3   Liver Function Tests:  Recent Labs Lab 04/15/14 1134  AST 411*  ALT 316*  ALKPHOS 74  BILITOT 1.3*  PROT 8.3  ALBUMIN 4.4    Recent Labs Lab 04/15/14 1216  LIPASE 59   CBC:  Recent Labs Lab 04/15/14 1134  WBC 3.3*  NEUTROABS 2.2  HGB 15.4  HCT 42.4  MCV 89.3  PLT 68*   Cardiac Enzymes:  Recent Labs Lab 04/15/14 0008 04/15/14 2003 04/16/14 0620  TROPONINI <0.30 <0.30 <0.30     Studies: Dg Chest 2 View  04/15/2014   CLINICAL DATA:  Mid chest pain with 2 days of nausea and vomiting  EXAM: CHEST  2 VIEW  COMPARISON:  Chest x-ray from an acute abdominal series of January 31, 2014  FINDINGS: The lungs are adequately inflated. There is no focal infiltrate. The heart and pulmonary  vascularity are within the limits of normal. There is no pleural effusion or pneumothorax. The bony thorax is unremarkable.  IMPRESSION: There is no acute cardiopulmonary abnormality.   Electronically Signed   By: [name]  Martinique   On: 04/15/2014 12:50   Ct Angio Chest Pe W/cm &/or Wo Cm  04/15/2014   CLINICAL DATA:  Chest pain, elevated D-dimer, history of malignancy  EXAM: CT ANGIOGRAPHY CHEST WITH CONTRAST  TECHNIQUE: Multidetector CT imaging of the chest was performed using the standard protocol during bolus administration of intravenous contrast. Multiplanar CT image reconstructions and MIPs were obtained to evaluate the vascular anatomy.  CONTRAST:  38mL OMNIPAQUE IOHEXOL 350 MG/ML SOLN  COMPARISON:  CTA chest dated 11/13/2011  FINDINGS: No evidence of pulmonary embolism.  Lungs are clear. No suspicious pulmonary nodules. No pleural effusion or pneumothorax.  Visualized thyroid is unremarkable.  The  heart is normal in size.  No pericardial effusion.  No suspicious mediastinal, hilar, or axillary lymphadenopathy.  Visualized upper abdomen is notable for severe hepatic steatosis.  Again noted is abnormal subcutaneous nodularity with asymmetric thickening of the right greater than left chest wall. This appearance is similar versus mildly progressed from 2013. At least some of this appearance may be postsurgical given the patient's history.  Visualized osseous structures are within normal limits.  Review of the MIP images confirms the above findings.  IMPRESSION: No evidence of pulmonary embolism.  No evidence of acute cardiopulmonary disease.  Asymmetric thickening/nodularity of the right greater than left chest wall, similar versus mildly progressed from 2013.   Electronically Signed   By: Julian Hy M.D.   On: 04/15/2014 19:21    Scheduled Meds: . amLODipine  5 mg Oral Daily  . ARIPiprazole  10 mg Oral QHS  . atorvastatin  20 mg Oral q1800  . ciprofloxacin  400 mg Intravenous Q12H  . DULoxetine   30 mg Oral Daily  . folic acid  1 mg Oral Daily  . gabapentin  200 mg Oral TID  . LORazepam  0-4 mg Intravenous Q6H   Followed by  . [START ON 04/17/2014] LORazepam  0-4 mg Intravenous Q12H  . metoprolol  50 mg Oral BID  . mirtazapine  15 mg Oral QHS  . multivitamin with minerals  1 tablet Oral Daily  . nicotine  14 mg Transdermal Daily  . pantoprazole  40 mg Oral Q1200  . sodium chloride  3 mL Intravenous Q12H  . sucralfate  1 g Oral TID WC & HS  . thiamine  100 mg Oral Daily   Continuous Infusions: . sodium chloride 1,000 mL (04/16/14 1044)    Principal Problem:   Alcohol dependence with withdrawal Active Problems:   HYPERTENSION   SEIZURE DISORDER   Major depressive disorder, recurrent episode   Alcohol withdrawal   Thrombocytopenia   Urinary tract infection, site not specified   Chest pain, unspecified   D-dimer, elevated    Darcella Gasman, PA-S  [facility] Pager [phone]. If 7PM-7AM, please contact night-coverage at [email or web address], password [facility] 04/16/2014, 2:58 PM  LOS: 1 day   AttendingPatient was seen, examined,treatment plan was discussed with the  Advance Practice Provider.  I have directly reviewed the clinical findings, lab, imaging studies and management of this patient in detail. I have made the necessary changes to the above noted documentation, and agree with the documentation, as recorded by the Advance Practice Provider.   In short, patient admitted for ETOH withdrawal, alcoholic hepatitis-monitor and treat as indicated above. Stop Cipro-as can lower seizure threshold, do not think patient has a UTI-urine cs pending.Stop Lipitor-given elevated LFT's.  Nena Alexander MD [facility].

## 2014-04-16 NOTE — Progress Notes (Signed)
  Echocardiogram 2D Echocardiogram has been performed.  [name] 04/16/2014, 12:09 PM

## 2014-04-16 NOTE — Progress Notes (Addendum)
Pt requesting a note from physician stating he's here and to be faxed to his lawyer for court in the morning. Paged provider on call.

## 2014-04-17 DIAGNOSIS — F1410 Cocaine abuse, uncomplicated: Secondary | ICD-10-CM

## 2014-04-17 DIAGNOSIS — F10988 Alcohol use, unspecified with other alcohol-induced disorder: Secondary | ICD-10-CM

## 2014-04-17 LAB — CBC
HEMATOCRIT: 41.2 % (ref 39.0–52.0)
Hemoglobin: 14.2 g/dL (ref 13.0–17.0)
MCH: 31.8 pg (ref 26.0–34.0)
MCHC: 34.5 g/dL (ref 30.0–36.0)
MCV: 92.2 fL (ref 78.0–100.0)
Platelets: 77 10*3/uL — ABNORMAL LOW (ref 150–400)
RBC: 4.47 MIL/uL (ref 4.22–5.81)
RDW: 14.4 % (ref 11.5–15.5)
WBC: 3.7 10*3/uL — AB (ref 4.0–10.5)

## 2014-04-17 LAB — HEPATIC FUNCTION PANEL
ALBUMIN: 3.3 g/dL — AB (ref 3.5–5.2)
ALK PHOS: 61 U/L (ref 39–117)
ALT: 160 U/L — AB (ref 0–53)
AST: 150 U/L — ABNORMAL HIGH (ref 0–37)
Bilirubin, Direct: 0.2 mg/dL (ref 0.0–0.3)
Indirect Bilirubin: 0.8 mg/dL (ref 0.3–0.9)
TOTAL PROTEIN: 7.1 g/dL (ref 6.0–8.3)
Total Bilirubin: 1 mg/dL (ref 0.3–1.2)

## 2014-04-17 NOTE — Progress Notes (Addendum)
PROGRESS NOTE  [name] YQM:[id number] DOB: 1981-02-19 DOA: 04/15/2014 PCP: Elbert Ewings, FNP   HPI/Subjective:   [name] is a 33 y.o. male with PMH significant for HTN, HLD, tobacco abuse, polysubstance abuse (cocaine, alcohol, marijuana), depression, schizophrenia and renal cell carcinoma (s/p left nephrectomy); came to ED complaining of nausea, vomiting, epigastric/chest pain and inability to keep things down. He reports abruptly stopping alcohol consumption approx 2-3 days prior to admission and has been feeling awful since then. Additional history- Patient stated he has been vomiting about once per day for the past month. Patient also stated he has attempted suicide 16 times, with the last attempt 3 months ago. Currently not suicidal.   9/30- Patient states he feels 100% better today. The chest and abdominal pain has resolved. Has not had nausea and vomiting since the evening of 9/28. Tolerating clear liquids. Appetite is increased and feels ready for solids. Had some more diarrhea yesterday evening. No more tremors. Energy level is good. Patient had hallucinations of family members for a few hours last night.  Assessment/Plan:   Alcohol dependence with withdrawal:  -Feeling much better. -N/V has resolved.  -Hallucinations evening of 9/29. No other events during admission. -Patient with history of hallucinations with alcohol withdrawal. -Last day of alcohol on Friday 9/25. (Correction from last note, which stated 9/26). -Diarrhea in evening of 9/29 and 9/30. Staying well hydrated. -CIWA protocoll thiamine and folic acid, Ativan, IVF's. Started on 9/28, 17:25. -Cessation counseling provided. Planning on outpatient rehab. Contact information provided. -B12 level normal, no anemia. -Discharge 10/1 if continues to do well.  Chest pain/ Abdominal Pain -substernal and epigastric pain resolved. -Serial troponin levels negative.  -2-D echo showed no significant changes to prior  echo in 2013. Systolic function normal, estimated EF of 55 to 60%  -CXR w/o infiltrates, -CTA showed no evidence of PE.  -Continue Protonix, d/c Carafate. -Advance to solid diet   Thrombocytopenia  -most likely due to alcohol -no signs of acute bleeding  -will avoid heparin products  -check peripheral smear   HYPERTENSION  -currently stable  -Is currently on metoprolol. Will re-evaluate prior to d/c as he uses cocaine.   SEIZURE DISORDER  -Not on antiepileptics  -Might be due to alcohol withdrawal.  -will monitor for seizures   Major depressive disorder  -recurrent episode: continue abilify, remeron and cymbalta  -Trazadone gives nightmares   HLD  -Cholesterol high at 264. Decreased from last year.  -continue Lipitor on discharge-stop for now given elevated LFT's   Transaminitis/ Liver Cirrhosis  -AST / ALT trending down nicely.  Likely elevated due to alcohol use. -due to alcohol abuse. Acute Hep panel negative in March 2015. HIV negative in March 2015  -Discontinue Lipitor -will monitor trend   Tobacco abuse  -nicotine patch.  -cessation counseling provided   UTI  -UA with positive LE and nitrites.  -Patient states increased urination. No dysuria or incontinence.  -May be due to his increased hydration since admission. -Urine culture colony ct: 50,000, with multiple bacterial morphotypes present, none predominant. -Sample may be contaminated. New culture if more UTI symptoms occur. -Hold antibiotics due to poor hepatic impairment and nephrectomy history.  GERD  -Tolerated clear liquids 9/29 and morning on 9/30.  -No increased pain, no n/v.  -D/C Carafate. Continue Protonix and PRN GI cocktail   DVT Prophylaxis: SCD's   Code Status: Full  Family Communication: No family at bedside. Mother periodically visits.  Disposition Plan: to home when appropriate.   Consultants:  None  Procedures:  Nnone  Antibiotics: Anti-infectives   Start     Dose/Rate Route  Frequency Ordered Stop   04/15/14 2000  ciprofloxacin (CIPRO) IVPB 400 mg  Status:  Discontinued     400 mg 200 mL/hr over 60 Minutes Intravenous Every 12 hours 04/15/14 1840 04/16/14 1816      Objective: Filed Vitals:   04/17/14 0014 04/17/14 0616 04/17/14 1012 04/17/14 1438  BP: 132/96 121/86 138/86 139/89  Pulse: 87 74  77  Temp: 97.8 F (36.6 C) 98.9 F (37.2 C)  98.6 F (37 C)  TempSrc: Oral Oral  Oral  Resp: 18 18    Height:      Weight:      SpO2: 97% 96%  98%    Intake/Output Summary (Last 24 hours) at 04/17/14 1627 Last data filed at 04/17/14 1616  Gross per 24 hour  Intake    745 ml  Output   4105 ml  Net  -3360 ml   Filed Weights   04/15/14 1035 04/15/14 1734  Weight: 74.844 kg (165 lb) 73.8 kg (162 lb 11.2 oz)    Exam: General: Well developed, well nourished, NAD, appears stated age  25:  EOMI, Anicteic Sclera, MMM. No pharyngeal erythema or exudates  Neck: Supple, no JVD, no masses  Cardiovascular: RRR, S1 S2 auscultated, no rubs, murmurs or gallops.   Respiratory: Clear to auscultation bilaterally with equal chest rise  Abdomen: Soft, mild tenderness superior to umbilicus, nondistended, + bowel sounds  Extremities: warm dry without cyanosis clubbing or edema, 2+ pulses in DP, TP Neuro: AAOx3, cranial nerves grossly intact. Strength 5/5 in upper and lower extremities  Skin: Without rashes exudates or nodules.   Psych: Normal affect and demeanor with intact judgement and insight   Data Reviewed: Basic Metabolic Panel:  Recent Labs Lab 04/15/14 1134 04/15/14 2003  NA 137  --   K 4.4  --   CL 94*  --   CO2 26  --   GLUCOSE 84  --   BUN 10  --   CREATININE 0.90  --   CALCIUM 9.9  --   MG  --  1.6  PHOS  --  2.3   Liver Function Tests:  Recent Labs Lab 04/15/14 1134 04/17/14 0715  AST 411* 150*  ALT 316* 160*  ALKPHOS 74 61  BILITOT 1.3* 1.0  PROT 8.3 7.1  ALBUMIN 4.4 3.3*    Recent Labs Lab 04/15/14 1216  LIPASE 59    CBC:  Recent Labs Lab 04/15/14 1134 04/17/14 0715  WBC 3.3* 3.7*  NEUTROABS 2.2  --   HGB 15.4 14.2  HCT 42.4 41.2  MCV 89.3 92.2  PLT 68* 77*   Cardiac Enzymes:  Recent Labs Lab 04/15/14 0008 04/15/14 2003 04/16/14 0620  TROPONINI <0.30 <0.30 <0.30     Recent Results (from the past 240 hour(s))  URINE CULTURE     Status: None   Collection Time    04/15/14 12:05 PM      Result Value Ref Range Status   Specimen Description URINE, CLEAN CATCH   Final   Special Requests NONE   Final   Culture  Setup Time     Final   Value: 04/15/2014 13:05     Performed at [facility]     Final   Value: 50,000 COLONIES/ML     Performed at [facility]   Culture     Final   Value: Multiple bacterial  morphotypes present, none predominant. Suggest appropriate recollection if clinically indicated.     Performed at [facility]   Report Status 04/16/2014 FINAL   Final     Studies: Ct Angio Chest Pe W/cm &/or Wo Cm  04/15/2014   CLINICAL DATA:  Chest pain, elevated D-dimer, history of malignancy  EXAM: CT ANGIOGRAPHY CHEST WITH CONTRAST  TECHNIQUE: Multidetector CT imaging of the chest was performed using the standard protocol during bolus administration of intravenous contrast. Multiplanar CT image reconstructions and MIPs were obtained to evaluate the vascular anatomy.  CONTRAST:  65mL OMNIPAQUE IOHEXOL 350 MG/ML SOLN  COMPARISON:  CTA chest dated 11/13/2011  FINDINGS: No evidence of pulmonary embolism.  Lungs are clear. No suspicious pulmonary nodules. No pleural effusion or pneumothorax.  Visualized thyroid is unremarkable.  The heart is normal in size.  No pericardial effusion.  No suspicious mediastinal, hilar, or axillary lymphadenopathy.  Visualized upper abdomen is notable for severe hepatic steatosis.  Again noted is abnormal subcutaneous nodularity with asymmetric thickening of the right greater than left chest wall. This appearance is similar  versus mildly progressed from 2013. At least some of this appearance may be postsurgical given the patient's history.  Visualized osseous structures are within normal limits.  Review of the MIP images confirms the above findings.  IMPRESSION: No evidence of pulmonary embolism.  No evidence of acute cardiopulmonary disease.  Asymmetric thickening/nodularity of the right greater than left chest wall, similar versus mildly progressed from 2013.   Electronically Signed   By: Julian Hy M.D.   On: 04/15/2014 19:21    Scheduled Meds: . amLODipine  5 mg Oral Daily  . ARIPiprazole  10 mg Oral QHS  . DULoxetine  30 mg Oral Daily  . feeding supplement (RESOURCE BREEZE)  1 Container Oral TID BM  . folic acid  1 mg Oral Daily  . gabapentin  200 mg Oral TID  . LORazepam  0-4 mg Intravenous Q6H   Followed by  . LORazepam  0-4 mg Intravenous Q12H  . metoprolol  50 mg Oral BID  . mirtazapine  15 mg Oral QHS  . multivitamin with minerals  1 tablet Oral Daily  . nicotine  14 mg Transdermal Daily  . pantoprazole  40 mg Oral Q1200  . sodium chloride  3 mL Intravenous Q12H  . thiamine  100 mg Oral Daily   Continuous Infusions: . sodium chloride 75 mL/hr (04/17/14 1351)    Principal Problem:   Alcohol dependence with withdrawal Active Problems:   HYPERTENSION   SEIZURE DISORDER   Major depressive disorder, recurrent episode   Alcohol withdrawal   Thrombocytopenia   Urinary tract infection, site not specified   Chest pain, unspecified   D-dimer, elevated   Cocaine abuse    [name] Gasman, PA-S  Imogene Burn, PA-C [facility] Pager [phone]. If 7PM-7AM, please contact night-coverage at [email or web address], password [facility] 04/17/2014, 4:27 PM  LOS: 2 days     Addendum  Patient seen and examined, chart and data base reviewed.  I agree with the above assessment and plan.  For full details please see Mr. [name] Gasman, PA-S note reviewed by Mrs. Imogene Burn PA.  I reviewed an amended  the above note as appropriate.   Birdie Hopes, MD [facility] Pager: [phone] 04/17/2014, 5:09 PM

## 2014-04-18 DIAGNOSIS — F1014 Alcohol abuse with alcohol-induced mood disorder: Secondary | ICD-10-CM

## 2014-04-18 DIAGNOSIS — R748 Abnormal levels of other serum enzymes: Secondary | ICD-10-CM

## 2014-04-18 DIAGNOSIS — F10239 Alcohol dependence with withdrawal, unspecified: Principal | ICD-10-CM

## 2014-04-18 DIAGNOSIS — F1010 Alcohol abuse, uncomplicated: Secondary | ICD-10-CM

## 2014-04-18 DIAGNOSIS — F1410 Cocaine abuse, uncomplicated: Secondary | ICD-10-CM

## 2014-04-18 LAB — BASIC METABOLIC PANEL
Anion gap: 11 (ref 5–15)
BUN: 6 mg/dL (ref 6–23)
CALCIUM: 8.7 mg/dL (ref 8.4–10.5)
CO2: 23 mEq/L (ref 19–32)
CREATININE: 0.87 mg/dL (ref 0.50–1.35)
Chloride: 104 mEq/L (ref 96–112)
GLUCOSE: 81 mg/dL (ref 70–99)
Potassium: 4 mEq/L (ref 3.7–5.3)
Sodium: 138 mEq/L (ref 137–147)

## 2014-04-18 MED ORDER — MIRTAZAPINE 15 MG PO TBDP: 15.0000 mg | ORAL_TABLET | Freq: Every day | ORAL | Status: DC

## 2014-04-18 MED ORDER — GABAPENTIN 100 MG PO CAPS: 200.0000 mg | ORAL_CAPSULE | Freq: Three times a day (TID) | ORAL | Status: DC

## 2014-04-18 MED ORDER — METOPROLOL TARTRATE 50 MG PO TABS: 50.0000 mg | ORAL_TABLET | Freq: Two times a day (BID) | ORAL | Status: DC

## 2014-04-18 MED ORDER — ONDANSETRON HCL 4 MG PO TABS: 4.0000 mg | ORAL_TABLET | Freq: Four times a day (QID) | ORAL | Status: DC | PRN

## 2014-04-18 MED ORDER — DULOXETINE HCL 30 MG PO CPEP: 30.0000 mg | ORAL_CAPSULE | Freq: Every day | ORAL | Status: DC

## 2014-04-18 MED ORDER — SIMVASTATIN 40 MG PO TABS: 40.0000 mg | ORAL_TABLET | Freq: Every day | ORAL | Status: DC

## 2014-04-18 MED ORDER — ARIPIPRAZOLE 10 MG PO TABS: 10.0000 mg | ORAL_TABLET | Freq: Every day | ORAL | Status: DC

## 2014-04-18 NOTE — Discharge Summary (Signed)
Addendum  Patient seen and examined, chart and data base reviewed.  I agree with the above assessment and plan.  For full details please see Mrs. Imogene Burn PA note.  Presented with alcohol abuse/withdrawal, no hallucinations or shaking on discharge.  Patient to followup with outpatient rehabilitation at [facility].   Birdie Hopes, MD [facility] Pager: [phone] 04/18/2014, 4:47 PM

## 2014-04-18 NOTE — Discharge Summary (Signed)
Physician Discharge Summary  [name] SHF:[id number] DOB: 26-Apr-1981 DOA: 04/15/2014  PCP: Elbert Ewings, FNP  Admit date: 04/15/2014 Discharge date: 04/18/2014  Time spent: 45 minutes  Recommendations for Outpatient Follow-up:  1. CMET, CBC in 1 week.  Patients LFTs have been elevated due to alcohol use.  Statin temporarily discontinued. 2. Patient plans to go to an outpatient alcohol rehab program in [facility].   Discharge Diagnoses:  Principal Problem:   Alcohol dependence with withdrawal Active Problems:   HYPERTENSION   SEIZURE DISORDER   Major depressive disorder, recurrent episode   Alcohol withdrawal   Thrombocytopenia   Urinary tract infection, site not specified   Chest pain, unspecified   D-dimer, elevated   Cocaine abuse   Discharge Condition: stable.  Diet recommendation: heart healthy   History of present illness at the time of admission:  [name] is a 33 y.o. male with PMH significant for HTN, HLD, tobacco abuse, polysubstance abuse (cocaine, alcohol, marijuana), depression, schizophrenia and renal cell carcinoma (s/p left nephrectomy); who came to ED complaining of nausea, vomiting, epigastric/chest pain and inability to keep things down. He reports abruptly stopping alcohol consumption approx 2-3 days prior to admission and has been feeling awful since then. Patient reports multiple vomiting episodes (no blood); and after vomiting is now having CP (non radiated, no associated, palpitation, no diaphoresis). Patient endorses mild vision changes, seeing black dots and small ants (had hx of withdrawal and hallucinations in the past). Patient denies fever, chills, cough, hematochezia and melena. In ED was found to be mildly dehydrated, tachycardic and with UA suggesting UTI. [facility] called to admit patient for further evaluation, tx and assistance with detox.  Hospital Course:  Alcohol dependence with withdrawal:  -Feeling much better. No further hallucinations or  shakiness.  Eating well, ambulating well. -Last drink of alcohol on Friday 9/25.  -was treated inpatient with CIWA protocoll thiamine and folic acid, Ativan, IVF's.  -Cessation counseling provided. Planning on outpatient rehab. Contact information provided.  -B12 level normal, no anemia.   Chest pain/ Abdominal Pain  -substernal and epigastric pain resolved.  -Serial troponin levels negative.  -2-D echo showed no significant changes to prior echo in 2013. Systolic function normal, estimated EF of 55 to 60%  -CXR w/o infiltrates, -CTA showed no evidence of PE.  -Continue Protonix -successfully advanced to solid diet with out further vomiting or diarrhea.   Thrombocytopenia (77k on 9/30) -most likely due to alcohol -no signs of acute bleeding  -monitor periodically outpatient.  HYPERTENSION  -currently stable  -Is currently on metoprolol. Reports he no longer uses cocaine.   SEIZURE DISORDER  -Quiet and stable during this admission. -Not on antiepileptics  -Might be due to alcohol withdrawal.  -No seizures while inpatient.  Major depressive disorder  -recurrent episode: continue abilify, remeron and cymbalta  -Trazadone gives nightmares - discontinued.  HLD  -Cholesterol high at 264. LDL at 181.  -Lipitor held until PCP follow up as LFTs were elevated during this admission.   Transaminitis/ Liver Cirrhosis  -AST / ALT trending down nicely.  -due to alcohol abuse. Acute Hep panel negative in March 2015. HIV negative in March 2015  -Discontinue Lipitor until PCP follow up.  Tobacco abuse  -nicotine patch.  -cessation counseling provided   UTI  -UA with positive LE and nitrites.  -Patient states no dysuria or incontinence.  -Urine culture colony ct: 50,000, with multiple bacterial morphotypes present, none predominant.  -Antibiotics discontinued.  GERD  Continue PPI   Procedures: 2D  Study Conclusions  - Procedure narrative: Transthoracic echocardiography. Image  quality was suboptimal. The study was technically difficult, as a result of poor sound wave transmission and chest wall deformity. - Left ventricle: The cavity size was normal. Wall thickness was increased in a pattern of mild LVH. Systolic function was normal. The estimated ejection fraction was in the range of 55% to 60%. Doppler parameters are consistent with abnormal left ventricular relaxation (grade 1 diastolic dysfunction). The E/e&' ratio is <8, suggesting normal LV filling pressure. - Left atrium: The atrium was normal in size.   Consultations:  none  Discharge Exam: Filed Vitals:   04/17/14 1438 04/17/14 1755 04/18/14 0015 04/18/14 0607  BP: 139/89 132/91 133/89 119/87  Pulse: 77 70 61 68  Temp: 98.6 F (37 C)  98.6 F (37 C) 98.5 F (36.9 C)  TempSrc: Oral  Oral Oral  Resp:   18 18  Height:      Weight:    73.982 kg (163 lb 1.6 oz)  SpO2: 98%  98% 97%   Filed Weights   04/15/14 1035 04/15/14 1734 04/18/14 0607  Weight: 74.844 kg (165 lb) 73.8 kg (162 lb 11.2 oz) 73.982 kg (163 lb 1.6 oz)     General: A&O, NAD, Clear speech, coherent. Cardiovascular: RRR, no m/r/g Respiratory: CTA no w/c/r Abdomen:  Soft, nt, nd, +bs, no masses Extremities:  Able to ambulate. 5/5 strength in each, no swelling.  Discharge Instructions   Discharge Instructions   Diet - low sodium heart healthy    Complete by:  As directed      Increase activity slowly    Complete by:  As directed           Current Discharge Medication List    START taking these medications   Details  ondansetron (ZOFRAN) 4 MG tablet Take 1 tablet (4 mg total) by mouth every 6 (six) hours as needed for nausea. Qty: 20 tablet, Refills: 0      CONTINUE these medications which have CHANGED   Details  ARIPiprazole (ABILIFY) 10 MG tablet Take 1 tablet (10 mg total) by mouth at bedtime. Qty: 30 tablet, Refills: 0    DULoxetine (CYMBALTA) 30 MG capsule Take 1 capsule (30 mg total) by mouth daily. Qty:  30 capsule, Refills: 0    gabapentin (NEURONTIN) 100 MG capsule Take 2 capsules (200 mg total) by mouth 3 (three) times daily. Qty: 180 capsule, Refills: 0    metoprolol (LOPRESSOR) 50 MG tablet Take 1 tablet (50 mg total) by mouth 2 (two) times daily. Qty: 60 tablet, Refills: 0    mirtazapine (REMERON SOL-TAB) 15 MG disintegrating tablet Take 1 tablet (15 mg total) by mouth at bedtime. Qty: 30 tablet, Refills: 0      CONTINUE these medications which have NOT CHANGED   Details  amLODipine (NORVASC) 5 MG tablet Take 1 tablet (5 mg total) by mouth daily. Qty: 30 tablet, Refills: 0    SUMAtriptan (IMITREX) 25 MG tablet Take 1 tablet (25 mg total) by mouth every 2 (two) hours as needed for migraine or headache. May repeat in 2 hours if headache persists or recurs. Qty: 10 tablet, Refills: 0      STOP taking these medications     traZODone (DESYREL) 100 MG tablet      simvastatin (ZOCOR) 40 MG tablet        Allergies  Allergen Reactions  . Codeine Hives, Itching and Swelling  . Depakote [Divalproex Sodium] Other (See Comments)    "  Bug out and hallucinate"  . Morphine Itching  . Penicillins Swelling  . Hydrocodone-Acetaminophen Itching and Rash   Follow-up Information   Follow up with [name],ANTHONY, FNP In 2 weeks.   Specialty:  Nurse Practitioner   Contact information:   [address] [place] [address] [phone]        The results of significant diagnostics from this hospitalization (including imaging, microbiology, ancillary and laboratory) are listed below for reference.    Significant Diagnostic Studies: Dg Chest 2 View  04/15/2014   CLINICAL DATA:  Mid chest pain with 2 days of nausea and vomiting  EXAM: CHEST  2 VIEW  COMPARISON:  Chest x-ray from an acute abdominal series of January 31, 2014  FINDINGS: The lungs are adequately inflated. There is no focal infiltrate. The heart and pulmonary vascularity are within the limits of normal. There is no  pleural effusion or pneumothorax. The bony thorax is unremarkable.  IMPRESSION: There is no acute cardiopulmonary abnormality.   Electronically Signed   By: [name]  Martin[place]   On: 04/15/2014 12:50   Ct Angio Chest Pe W/cm &/or Wo Cm  04/15/2014   CLINICAL DATA:  Chest pain, elevated D-dimer, history of malignancy  EXAM: CT ANGIOGRAPHY CHEST WITH CONTRAST  TECHNIQUE: Multidetector CT imaging of the chest was performed using the standard protocol during bolus administration of intravenous contrast. Multiplanar CT image reconstructions and MIPs were obtained to evaluate the vascular anatomy.  CONTRAST:  57mL OMNIPAQUE IOHEXOL 350 MG/ML SOLN  COMPARISON:  CTA chest dated 11/13/2011  FINDINGS: No evidence of pulmonary embolism.  Lungs are clear. No suspicious pulmonary nodules. No pleural effusion or pneumothorax.  Visualized thyroid is unremarkable.  The heart is normal in size.  No pericardial effusion.  No suspicious mediastinal, hilar, or axillary lymphadenopathy.  Visualized upper abdomen is notable for severe hepatic steatosis.  Again noted is abnormal subcutaneous nodularity with asymmetric thickening of the right greater than left chest wall. This appearance is similar versus mildly progressed from 2013. At least some of this appearance may be postsurgical given the patient's history.  Visualized osseous structures are within normal limits.  Review of the MIP images confirms the above findings.  IMPRESSION: No evidence of pulmonary embolism.  No evidence of acute cardiopulmonary disease.  Asymmetric thickening/nodularity of the right greater than left chest wall, similar versus mildly progressed from 2013.   Electronically Signed   By: Julian Hy M.D.   On: 04/15/2014 19:21    Microbiology: Recent Results (from the past 240 hour(s))  URINE CULTURE     Status: None   Collection Time    04/15/14 12:05 PM      Result Value Ref Range Status   Specimen Description URINE, CLEAN CATCH   Final   Special  Requests NONE   Final   Culture  Setup Time     Final   Value: 04/15/2014 13:05     Performed at [place]     Final   Value: 50,000 COLONIES/ML     Performed at [facility]   Culture     Final   Value: Multiple bacterial morphotypes present, none predominant. Suggest appropriate recollection if clinically indicated.     Performed at [facility]   Report Status 04/16/2014 FINAL   Final     Labs: Basic Metabolic Panel:  Recent Labs Lab 04/15/14 1134 04/15/14 2003 04/18/14 0721  NA 137  --  138  K 4.4  --  4.0  CL 94*  --  104  CO2 26  --  23  GLUCOSE 84  --  81  BUN 10  --  6  CREATININE 0.90  --  0.87  CALCIUM 9.9  --  8.7  MG  --  1.6  --   PHOS  --  2.3  --    Liver Function Tests:  Recent Labs Lab 04/15/14 1134 04/17/14 0715  AST 411* 150*  ALT 316* 160*  ALKPHOS 74 61  BILITOT 1.3* 1.0  PROT 8.3 7.1  ALBUMIN 4.4 3.3*    Recent Labs Lab 04/15/14 1216  LIPASE 59   CBC:  Recent Labs Lab 04/15/14 1134 04/17/14 0715  WBC 3.3* 3.7*  NEUTROABS 2.2  --   HGB 15.4 14.2  HCT 42.4 41.2  MCV 89.3 92.2  PLT 68* 77*   Cardiac Enzymes:  Recent Labs Lab 04/15/14 0008 04/15/14 2003 04/16/14 0620  TROPONINI <0.30 <0.30 <0.30    Signed:  Melton Alar, PA-C  [facility] 04/18/2014, 12:40 PM

## 2014-04-18 NOTE — Progress Notes (Signed)
NURSING PROGRESS NOTE  [name] [id number] Discharge Data: 04/18/2014 10:52 AM Attending Provider: Verlee Monte, MD RFF:[id number], FNP     [name] to be D/C'd Home per MD order.  Discussed with the patient the After Visit Summary and all questions fully answered. All IV's discontinued with no bleeding noted. All belongings returned to patient for patient to take home.   Last Vital Signs:  Blood pressure 119/87, pulse 68, temperature 98.5 F (36.9 C), temperature source Oral, resp. rate 18, height 5\' 6"  (1.676 m), weight 73.982 kg (163 lb 1.6 oz), SpO2 97.00%.  Discharge Medication List   Medication List    STOP taking these medications       simvastatin 40 MG tablet  Commonly known as:  ZOCOR     traZODone 100 MG tablet  Commonly known as:  DESYREL      TAKE these medications       amLODipine 5 MG tablet  Commonly known as:  NORVASC  Take 1 tablet (5 mg total) by mouth daily.     ARIPiprazole 10 MG tablet  Commonly known as:  ABILIFY  Take 1 tablet (10 mg total) by mouth at bedtime.     DULoxetine 30 MG capsule  Commonly known as:  CYMBALTA  Take 1 capsule (30 mg total) by mouth daily.     gabapentin 100 MG capsule  Commonly known as:  NEURONTIN  Take 2 capsules (200 mg total) by mouth 3 (three) times daily.     metoprolol 50 MG tablet  Commonly known as:  LOPRESSOR  Take 1 tablet (50 mg total) by mouth 2 (two) times daily.     mirtazapine 15 MG disintegrating tablet  Commonly known as:  REMERON SOL-TAB  Take 1 tablet (15 mg total) by mouth at bedtime.     ondansetron 4 MG tablet  Commonly known as:  ZOFRAN  Take 1 tablet (4 mg total) by mouth every 6 (six) hours as needed for nausea.     SUMAtriptan 25 MG tablet  Commonly known as:  IMITREX  Take 1 tablet (25 mg total) by mouth every 2 (two) hours as needed for migraine or headache. May repeat in 2 hours if headache persists or recurs.

## 2014-04-18 NOTE — Care Management Note (Signed)
    Page 1 of 1   04/18/2014     2:33:02 PM CARE MANAGEMENT NOTE 04/18/2014  Patient:  [name], [name]   Account Number:  [id number]  Date Initiated:  04/18/2014  Documentation initiated by:  Tomi Bamberger  Subjective/Objective Assessment:   dx etoh withdrawal  admit- from home alone. pta indep. Patient has an orange card for medications and he goes to [facility].     Action/Plan:   Anticipated DC Date:  04/18/2014   Anticipated DC Plan:  [place]  CM consult      Choice offered to / List presented to:             Status of service:  Completed, signed off Medicare Important Message given?  NO (If response is "NO", the following Medicare IM given date fields will be blank) Date Medicare IM given:   Medicare IM given by:   Date Additional Medicare IM given:   Additional Medicare IM given by:    Discharge Disposition:  HOME/SELF CARE  Per UR Regulation:  Reviewed for med. necessity/level of care/duration of stay  If discussed at [place] of Stay Meetings, dates discussed:    Comments:  04/18/14 [place], BSN [phone] NCM spoke with patient, he states he has an orange card for his medications and he goes to the [facility] to pick meds up and he also goes to [facility].  Patient states he has a hospital f/u apt to see his pcp next week. Patient states he has transportation.

## 2014-08-02 ENCOUNTER — Inpatient Hospital Stay (HOSPITAL_COMMUNITY)
Admission: EM | Admit: 2014-08-02 | Discharge: 2014-08-06 | DRG: 918 | Disposition: A | Discharge status: Home / Self Care | Payer: No Typology Code available for payment source | Attending: Internal Medicine | Admitting: Internal Medicine

## 2014-08-02 DIAGNOSIS — I2510 Atherosclerotic heart disease of native coronary artery without angina pectoris: Secondary | ICD-10-CM | POA: Diagnosis present

## 2014-08-02 DIAGNOSIS — F339 Major depressive disorder, recurrent, unspecified: Secondary | ICD-10-CM | POA: Diagnosis present

## 2014-08-02 DIAGNOSIS — Z658 Other specified problems related to psychosocial circumstances: Secondary | ICD-10-CM

## 2014-08-02 DIAGNOSIS — Z599 Problem related to housing and economic circumstances, unspecified: Secondary | ICD-10-CM

## 2014-08-02 DIAGNOSIS — Z781 Physical restraint status: Secondary | ICD-10-CM

## 2014-08-02 DIAGNOSIS — F22 Delusional disorders: Secondary | ICD-10-CM | POA: Diagnosis present

## 2014-08-02 DIAGNOSIS — T391X1A Poisoning by 4-Aminophenol derivatives, accidental (unintentional), initial encounter: Secondary | ICD-10-CM | POA: Insufficient documentation

## 2014-08-02 DIAGNOSIS — F17210 Nicotine dependence, cigarettes, uncomplicated: Secondary | ICD-10-CM | POA: Diagnosis present

## 2014-08-02 DIAGNOSIS — F1010 Alcohol abuse, uncomplicated: Secondary | ICD-10-CM | POA: Diagnosis present

## 2014-08-02 DIAGNOSIS — Z880 Allergy status to penicillin: Secondary | ICD-10-CM

## 2014-08-02 DIAGNOSIS — F209 Schizophrenia, unspecified: Secondary | ICD-10-CM | POA: Diagnosis present

## 2014-08-02 DIAGNOSIS — E780 Pure hypercholesterolemia: Secondary | ICD-10-CM | POA: Diagnosis present

## 2014-08-02 DIAGNOSIS — I70219 Atherosclerosis of native arteries of extremities with intermittent claudication, unspecified extremity: Secondary | ICD-10-CM | POA: Diagnosis present

## 2014-08-02 DIAGNOSIS — Z886 Allergy status to analgesic agent status: Secondary | ICD-10-CM

## 2014-08-02 DIAGNOSIS — Z9010 Acquired absence of unspecified breast and nipple: Secondary | ICD-10-CM | POA: Diagnosis present

## 2014-08-02 DIAGNOSIS — F4310 Post-traumatic stress disorder, unspecified: Secondary | ICD-10-CM | POA: Diagnosis present

## 2014-08-02 DIAGNOSIS — Z905 Acquired absence of kidney: Secondary | ICD-10-CM

## 2014-08-02 DIAGNOSIS — F14288 Cocaine dependence with other cocaine-induced disorder: Secondary | ICD-10-CM | POA: Diagnosis present

## 2014-08-02 DIAGNOSIS — T391X2A Poisoning by 4-Aminophenol derivatives, intentional self-harm, initial encounter: Principal | ICD-10-CM | POA: Diagnosis present

## 2014-08-02 DIAGNOSIS — Y908 Blood alcohol level of 240 mg/100 ml or more: Secondary | ICD-10-CM | POA: Diagnosis present

## 2014-08-02 DIAGNOSIS — I4581 Long QT syndrome: Secondary | ICD-10-CM | POA: Diagnosis present

## 2014-08-02 DIAGNOSIS — K7460 Unspecified cirrhosis of liver: Secondary | ICD-10-CM | POA: Diagnosis present

## 2014-08-02 DIAGNOSIS — F319 Bipolar disorder, unspecified: Secondary | ICD-10-CM | POA: Diagnosis present

## 2014-08-02 DIAGNOSIS — Z853 Personal history of malignant neoplasm of breast: Secondary | ICD-10-CM

## 2014-08-02 DIAGNOSIS — I739 Peripheral vascular disease, unspecified: Secondary | ICD-10-CM | POA: Diagnosis present

## 2014-08-02 DIAGNOSIS — R443 Hallucinations, unspecified: Secondary | ICD-10-CM | POA: Diagnosis present

## 2014-08-02 DIAGNOSIS — F1410 Cocaine abuse, uncomplicated: Secondary | ICD-10-CM | POA: Diagnosis present

## 2014-08-02 DIAGNOSIS — I10 Essential (primary) hypertension: Secondary | ICD-10-CM | POA: Diagnosis present

## 2014-08-02 DIAGNOSIS — F1910 Other psychoactive substance abuse, uncomplicated: Secondary | ICD-10-CM | POA: Insufficient documentation

## 2014-08-02 DIAGNOSIS — Z885 Allergy status to narcotic agent status: Secondary | ICD-10-CM

## 2014-08-02 HISTORY — DX: Personal history of self-harm: Z91.5

## 2014-08-02 LAB — I-STAT CG4 LACTIC ACID, ED: Lactic Acid, Venous: 1.89 mmol/L (ref 0.5–2.2)

## 2014-08-02 MED ORDER — SODIUM CHLORIDE 0.9 % IV BOLUS (SEPSIS)
1000.0000 mL | Freq: Once | INTRAVENOUS | Status: AC
  Administered 2014-08-03: 1000 mL via INTRAVENOUS

## 2014-08-02 NOTE — ED Notes (Signed)
Pt from home BIB mother who stated that pt said that he took a whole bottle of tylenol and wanted to kill himself.  Pt yelling and combative outside in the parking lot.  Requesting this RN to give him a shot to die "I just want to die.  Why wont yall let me?"  Told triage tech that he wanted her to "hang him".  Pt drinking from bottle of wine upon arrival in department with mother.

## 2014-08-02 NOTE — ED Notes (Signed)
Dr. Oni at bedside. 

## 2014-08-02 NOTE — ED Notes (Signed)
House coverage contacted for sitter.

## 2014-08-02 NOTE — ED Provider Notes (Signed)
CSN: [id number]     Arrival date & time 08/02/14  2310 History  This chart was scribed for [name] Balls, MD by Marlowe Kays, ED Scribe. This patient was seen in room [facility] and the patient's care was started at 11:31 PM.  Chief Complaint  Patient presents with  . Suicidal   LEVEL 5 CAVEAT- Full history could not be obtained due to pt refusing to speak.  The history is provided by a parent and the patient. No language interpreter was used.    HPI Comments:  [name] is a 34 y.o. male, brought in by [employer] and involuntarily committed by mother, with PMH of schizophrenia and bipolar disorder who presents to the Emergency Department complaining of suicidal ideations. Pt states mother brought him in because he admitted to taking an entire bottle of Tylenol about 45 minutes PTA trying to kill himself. He states he took 16 tablets, mother states 19. These were 500 mg tablets, as well as 25 mg diphenhydramine. He states he is going to kill himself no matter what and there is nothing anyone can do to stop it. It is reported that he has also drank wine tonight but he refuses to answer any questions. PMH of seizures, HTN, depression, pancreatitis, liver cirrhosis, CAD, angina, HA, hypercholesteremia, and cancer of left kidney.  Past Medical History  Diagnosis Date  . Seizures   . Hypertension   . Depression   . Pancreatitis   . Liver cirrhosis   . Coronary artery disease   . Angina   . Shortness of breath   . Headache(784.0)   . Peripheral vascular disease April 2011    Left Pop  . Hypercholesteremia   . Schizophrenia   . Bipolar 1 disorder   . Cancer of kidney dx'd 04/2013    lt nephrectomy  . Breast CA dx'd 2009    bil w/ bil masectomy and oral meds   Past Surgical History  Procedure Laterality Date  . Chest surgery    . Left leg surgery    . Mastectomy    . Left kidney removal    . Breast surgery     Family History  Problem Relation Age of Onset  . Stroke Other     History  Substance Use Topics  . Smoking status: Current Every Day Smoker -- 0.10 packs/day for 4 years    Types: Cigarettes  . Smokeless tobacco: Never Used  . Alcohol Use: Not on file     Comment: at [name] for ETOH and cocaine    Review of Systems  Unable to perform ROS  LEVEL 5 CAVEAT- Full history could not be obtained due to pt refusing to speak.  Allergies  Codeine; Depakote; Morphine; Penicillins; and Hydrocodone-acetaminophen  Home Medications   Prior to Admission medications   Medication Sig Start Date End Date Taking? Authorizing Provider  amLODipine (NORVASC) 5 MG tablet Take 1 tablet (5 mg total) by mouth daily. Patient not taking: Reported on 08/03/2014 02/06/14   Benjamine Mola, FNP  ARIPiprazole (ABILIFY) 10 MG tablet Take 1 tablet (10 mg total) by mouth at bedtime. Patient not taking: Reported on 08/03/2014 04/18/14   Melton Alar, PA-C  DULoxetine (CYMBALTA) 30 MG capsule Take 1 capsule (30 mg total) by mouth daily. Patient not taking: Reported on 08/03/2014 04/18/14   Melton Alar, PA-C  gabapentin (NEURONTIN) 100 MG capsule Take 2 capsules (200 mg total) by mouth 3 (three) times daily. Patient not taking: Reported on 08/03/2014 04/18/14  Melton Alar, PA-C  metoprolol (LOPRESSOR) 50 MG tablet Take 1 tablet (50 mg total) by mouth 2 (two) times daily. Patient not taking: Reported on 08/03/2014 04/18/14   Melton Alar, PA-C  mirtazapine (REMERON SOL-TAB) 15 MG disintegrating tablet Take 1 tablet (15 mg total) by mouth at bedtime. Patient not taking: Reported on 08/03/2014 04/18/14   Bobby Rumpf York, PA-C  ondansetron (ZOFRAN) 4 MG tablet Take 1 tablet (4 mg total) by mouth every 6 (six) hours as needed for nausea. Patient not taking: Reported on 08/03/2014 04/18/14   Melton Alar, PA-C  SUMAtriptan (IMITREX) 25 MG tablet Take 1 tablet (25 mg total) by mouth every 2 (two) hours as needed for migraine or headache. May repeat in 2 hours if headache persists or  recurs. Patient not taking: Reported on 08/03/2014 02/06/14   Benjamine Mola, FNP   Triage Vitals: BP 135/98 mmHg  Pulse 93  Temp(Src) 98.1 F (36.7 C) (Oral)  Resp 22  Ht 5\' 6"  (1.676 m)  Wt 165 lb (74.844 kg)  BMI 26.64 kg/m2  SpO2 100% Physical Exam  Constitutional: He is oriented to person, place, and time. Vital signs are normal. He appears well-developed and well-nourished.  Non-toxic appearance. He does not appear ill. No distress.  HENT:  Head: Normocephalic and atraumatic.  Nose: Nose normal.  Mouth/Throat: Oropharynx is clear and moist. No oropharyngeal exudate.  Eyes: Conjunctivae and EOM are normal. Pupils are equal, round, and reactive to light. No scleral icterus.  Neck: Normal range of motion. Neck supple. No tracheal deviation, no edema, no erythema and normal range of motion present. No thyroid mass and no thyromegaly present.  Cardiovascular: Normal rate, regular rhythm, S1 normal, S2 normal, normal heart sounds, intact distal pulses and normal pulses.  Exam reveals no gallop and no friction rub.   No murmur heard. Pulses:      Radial pulses are 2+ on the right side, and 2+ on the left side.       Dorsalis pedis pulses are 2+ on the right side, and 2+ on the left side.  Pulmonary/Chest: Effort normal and breath sounds normal. No respiratory distress. He has no wheezes. He has no rhonchi. He has no rales.  Abdominal: Soft. Normal appearance and bowel sounds are normal. He exhibits no distension, no ascites and no mass. There is no hepatosplenomegaly. There is no tenderness. There is no rebound, no guarding and no CVA tenderness.  Musculoskeletal: Normal range of motion. He exhibits no edema or tenderness.  Lymphadenopathy:    He has no cervical adenopathy.  Neurological: He is alert and oriented to person, place, and time. He has normal strength. No cranial nerve deficit or sensory deficit. GCS eye subscore is 4. GCS verbal subscore is 5. GCS motor subscore is 6.  Skin:  Skin is warm, dry and intact. No petechiae and no rash noted. He is not diaphoretic. No erythema. No pallor.  Psychiatric: He has a normal mood and affect. His behavior is normal. Judgment normal.  Nursing note and vitals reviewed.   ED Course  Procedures (including critical care time) DIAGNOSTIC STUDIES: Oxygen Saturation is 100% on RA, normal by my interpretation.   COORDINATION OF CARE: 11:37 PM- Will order standard medical clearance labs. Pt verbalizes understanding and agrees to plan.  Medications  acetylcysteine (ACETADOTE) 40 mg/mL load via infusion 11,220 mg (not administered)  sodium chloride 0.9 % bolus 1,000 mL (1,000 mLs Intravenous New Bag/Given 08/03/14 0001)   Labs Review Labs  Reviewed  COMPREHENSIVE METABOLIC PANEL - Abnormal; Notable for the following:    AST 73 (*)    ALT 77 (*)    All other components within normal limits  ETHANOL - Abnormal; Notable for the following:    Alcohol, Ethyl (B) 322 (*)    All other components within normal limits  CBC WITH DIFFERENTIAL - Abnormal; Notable for the following:    Neutrophils Relative % 19 (*)    Neutro Abs 1.4 (*)    Lymphocytes Relative 72 (*)    Lymphs Abs 5.3 (*)    All other components within normal limits  ACETAMINOPHEN LEVEL - Abnormal; Notable for the following:    Acetaminophen (Tylenol), Serum 181.1 (*)    All other components within normal limits  LIPASE, BLOOD  SALICYLATE LEVEL  PROTIME-INR  MAGNESIUM  CBC WITH DIFFERENTIAL  URINE RAPID DRUG SCREEN (HOSP PERFORMED)  I-STAT CG4 LACTIC ACID, ED    Imaging Review No results found.   EKG Interpretation   Date/Time:  Friday August 02 2014 23:49:16 EST Ventricular Rate:  97 PR Interval:  119 QRS Duration: 87 QT Interval:  395 QTC Calculation: 502 R Axis:   77 Text Interpretation:  Sinus rhythm Borderline short PR interval Prolonged  QT interval Wolff-Parkinson-White Confirmed by Glynn Octave  [phone]) on 08/03/2014 12:21:11 AM       MDM   Final diagnoses:  None    Patient presents after intentional overdose. He states he took 16 tablets, his mother states 16. Tablets are acetaminophen 500 mg, diphenhydramine 25 mg. I assume the patient took 9 total grams of Tylenol and 450 mg of Benadryl. Patient states he is still actively suicidal and states he will kill himself after he leaves here.  EKG reveals a prolonged QT at 503, no signs of TCA overdose.  I spoke with [name] from poison control who suggested four-hour Tylenol level at 2:30 AM. Effects of Benadryl in terms of sedation can last 4-6 hours.  Patient is resting currently in emergency department. Four-hour Tylenol level returned at 181.1. This is above toxic level. Patient was given NAC for treatment and will be admitted to hospitalist service for continued management.  CRITICAL CARE Performed by: [name]   Total critical care time: 30min - possible liver failure  Critical care time was exclusive of separately billable procedures and treating other patients.  Critical care was necessary to treat or prevent imminent or life-threatening deterioration.  Critical care was time spent personally by me on the following activities: development of treatment plan with patient and/or surrogate as well as nursing, discussions with consultants, evaluation of patient's response to treatment, examination of patient, obtaining history from patient or surrogate, ordering and performing treatments and interventions, ordering and review of laboratory studies, ordering and review of radiographic studies, pulse oximetry and re-evaluation of patient's condition.   I personally performed the services described in this documentation, which was scribed in my presence. The recorded information has been reviewed and is accurate.    [name] Balls, MD 08/03/14 [phone]

## 2014-08-03 ENCOUNTER — Encounter (HOSPITAL_COMMUNITY): Payer: Self-pay | Admitting: Emergency Medicine

## 2014-08-03 DIAGNOSIS — F332 Major depressive disorder, recurrent severe without psychotic features: Secondary | ICD-10-CM

## 2014-08-03 DIAGNOSIS — T391X1A Poisoning by 4-Aminophenol derivatives, accidental (unintentional), initial encounter: Secondary | ICD-10-CM | POA: Insufficient documentation

## 2014-08-03 DIAGNOSIS — F10239 Alcohol dependence with withdrawal, unspecified: Secondary | ICD-10-CM

## 2014-08-03 DIAGNOSIS — R079 Chest pain, unspecified: Secondary | ICD-10-CM

## 2014-08-03 LAB — RAPID URINE DRUG SCREEN, HOSP PERFORMED
Amphetamines: NOT DETECTED
BENZODIAZEPINES: NOT DETECTED
Barbiturates: NOT DETECTED
COCAINE: NOT DETECTED
Opiates: NOT DETECTED
Tetrahydrocannabinol: NOT DETECTED

## 2014-08-03 LAB — CBG MONITORING, ED
GLUCOSE-CAPILLARY: 86 mg/dL (ref 70–99)
GLUCOSE-CAPILLARY: 118 mg/dL — AB (ref 70–99)
Glucose-Capillary: 78 mg/dL (ref 70–99)

## 2014-08-03 LAB — CBC WITH DIFFERENTIAL/PLATELET
Basophils Absolute: 0 10*3/uL (ref 0.0–0.1)
Basophils Relative: 0 % (ref 0–1)
EOS ABS: 0.2 10*3/uL (ref 0.0–0.7)
EOS PCT: 3 % (ref 0–5)
HCT: 43.5 % (ref 39.0–52.0)
Hemoglobin: 15.4 g/dL (ref 13.0–17.0)
LYMPHS ABS: 5.3 10*3/uL — AB (ref 0.7–4.0)
Lymphocytes Relative: 72 % — ABNORMAL HIGH (ref 12–46)
MCH: 32.8 pg (ref 26.0–34.0)
MCHC: 35.4 g/dL (ref 30.0–36.0)
MCV: 92.8 fL (ref 78.0–100.0)
Monocytes Absolute: 0.4 10*3/uL (ref 0.1–1.0)
Monocytes Relative: 6 % (ref 3–12)
NEUTROS PCT: 19 % — AB (ref 43–77)
Neutro Abs: 1.4 10*3/uL — ABNORMAL LOW (ref 1.7–7.7)
Platelets: 243 10*3/uL (ref 150–400)
RBC: 4.69 MIL/uL (ref 4.22–5.81)
RDW: 13.6 % (ref 11.5–15.5)
WBC: 7.4 10*3/uL (ref 4.0–10.5)

## 2014-08-03 LAB — COMPREHENSIVE METABOLIC PANEL
ALBUMIN: 4.1 g/dL (ref 3.5–5.2)
ALK PHOS: 34 U/L — AB (ref 39–117)
ALK PHOS: 47 U/L (ref 39–117)
ALT: 64 U/L — AB (ref 0–53)
ALT: 77 U/L — AB (ref 0–53)
ANION GAP: 11 (ref 5–15)
ANION GAP: 12 (ref 5–15)
AST: 58 U/L — AB (ref 0–37)
AST: 73 U/L — ABNORMAL HIGH (ref 0–37)
Albumin: 3.2 g/dL — ABNORMAL LOW (ref 3.5–5.2)
BUN: 7 mg/dL (ref 6–23)
BUN: 9 mg/dL (ref 6–23)
CALCIUM: 9.1 mg/dL (ref 8.4–10.5)
CO2: 23 mmol/L (ref 19–32)
CO2: 25 mmol/L (ref 19–32)
CREATININE: 0.8 mg/dL (ref 0.50–1.35)
Calcium: 8.3 mg/dL — ABNORMAL LOW (ref 8.4–10.5)
Chloride: 105 mEq/L (ref 96–112)
Chloride: 106 mEq/L (ref 96–112)
Creatinine, Ser: 0.94 mg/dL (ref 0.50–1.35)
GFR calc non Af Amer: >90 mL/min (ref >90)
Glucose, Bld: 73 mg/dL (ref 70–99)
Glucose, Bld: 77 mg/dL (ref 70–99)
POTASSIUM: 3.7 mmol/L (ref 3.5–5.1)
Potassium: 4 mmol/L (ref 3.5–5.1)
SODIUM: 141 mmol/L (ref 135–145)
Sodium: 141 mmol/L (ref 135–145)
TOTAL PROTEIN: 7.3 g/dL (ref 6.0–8.3)
Total Bilirubin: 0.4 mg/dL (ref 0.3–1.2)
Total Bilirubin: 0.5 mg/dL (ref 0.3–1.2)
Total Protein: 6 g/dL (ref 6.0–8.3)

## 2014-08-03 LAB — MRSA PCR SCREENING: MRSA by PCR: NEGATIVE

## 2014-08-03 LAB — CBC
HCT: 40.9 % (ref 39.0–52.0)
HEMOGLOBIN: 14.2 g/dL (ref 13.0–17.0)
MCH: 31.8 pg (ref 26.0–34.0)
MCHC: 34.7 g/dL (ref 30.0–36.0)
MCV: 91.7 fL (ref 78.0–100.0)
Platelets: 226 10*3/uL (ref 150–400)
RBC: 4.46 MIL/uL (ref 4.22–5.81)
RDW: 13.4 % (ref 11.5–15.5)
WBC: 3.5 10*3/uL — ABNORMAL LOW (ref 4.0–10.5)

## 2014-08-03 LAB — SALICYLATE LEVEL: Salicylate Lvl: <4 mg/dL (ref 2.8–20.0)

## 2014-08-03 LAB — ACETAMINOPHEN LEVEL
Acetaminophen (Tylenol), Serum: 181.1 ug/mL (ref 10–30)
Acetaminophen (Tylenol), Serum: 32.4 ug/mL — ABNORMAL HIGH (ref 10–30)

## 2014-08-03 LAB — LIPASE, BLOOD: LIPASE: 30 U/L (ref 11–59)

## 2014-08-03 LAB — GLUCOSE, CAPILLARY: Glucose-Capillary: 107 mg/dL — ABNORMAL HIGH (ref 70–99)

## 2014-08-03 LAB — ETHANOL: ALCOHOL ETHYL (B): 322 mg/dL — AB (ref 0–9)

## 2014-08-03 LAB — PROTIME-INR
INR: 0.95 (ref 0.00–1.49)
Prothrombin Time: 12.7 seconds (ref 11.6–15.2)

## 2014-08-03 LAB — MAGNESIUM: Magnesium: 1.9 mg/dL (ref 1.5–2.5)

## 2014-08-03 MED ORDER — PANTOPRAZOLE SODIUM 40 MG PO TBEC
40.0000 mg | DELAYED_RELEASE_TABLET | Freq: Two times a day (BID) | ORAL | Status: DC
  Administered 2014-08-03 – 2014-08-05 (×5): 40 mg via ORAL
  Filled 2014-08-03 (×5): qty 1

## 2014-08-03 MED ORDER — LORAZEPAM 2 MG/ML IJ SOLN
0.0000 mg | Freq: Two times a day (BID) | INTRAMUSCULAR | Status: DC
  Filled 2014-08-03: qty 2
  Filled 2014-08-03: qty 1

## 2014-08-03 MED ORDER — FOLIC ACID 1 MG PO TABS
1.0000 mg | ORAL_TABLET | Freq: Every day | ORAL | Status: DC
  Administered 2014-08-03 – 2014-08-06 (×4): 1 mg via ORAL
  Filled 2014-08-03 (×4): qty 1

## 2014-08-03 MED ORDER — MIRTAZAPINE 15 MG PO TBDP
15.0000 mg | ORAL_TABLET | Freq: Every day | ORAL | Status: DC
  Administered 2014-08-03 – 2014-08-05 (×3): 15 mg via ORAL
  Filled 2014-08-03 (×4): qty 1

## 2014-08-03 MED ORDER — THIAMINE HCL 100 MG/ML IJ SOLN
100.0000 mg | Freq: Every day | INTRAMUSCULAR | Status: DC
  Filled 2014-08-03 (×3): qty 1

## 2014-08-03 MED ORDER — GI COCKTAIL ~~LOC~~
30.0000 mL | Freq: Once | ORAL | Status: AC
  Administered 2014-08-03: 30 mL via ORAL
  Filled 2014-08-03: qty 30

## 2014-08-03 MED ORDER — ADULT MULTIVITAMIN W/MINERALS CH
1.0000 | ORAL_TABLET | Freq: Every day | ORAL | Status: DC
  Administered 2014-08-03 – 2014-08-06 (×4): 1 via ORAL
  Filled 2014-08-03 (×4): qty 1

## 2014-08-03 MED ORDER — NICOTINE 14 MG/24HR TD PT24
14.0000 mg | MEDICATED_PATCH | Freq: Every day | TRANSDERMAL | Status: DC
  Filled 2014-08-03 (×3): qty 1

## 2014-08-03 MED ORDER — SODIUM CHLORIDE 0.9 % IJ SOLN
3.0000 mL | Freq: Two times a day (BID) | INTRAMUSCULAR | Status: DC
  Administered 2014-08-04 – 2014-08-06 (×4): 3 mL via INTRAVENOUS

## 2014-08-03 MED ORDER — LORAZEPAM 2 MG/ML IJ SOLN
0.0000 mg | Freq: Four times a day (QID) | INTRAMUSCULAR | Status: AC
  Administered 2014-08-03: 2 mg via INTRAVENOUS
  Administered 2014-08-03: 1 mg via INTRAVENOUS
  Administered 2014-08-03 – 2014-08-04 (×2): 2 mg via INTRAVENOUS
  Administered 2014-08-04 (×3): 4 mg via INTRAVENOUS
  Administered 2014-08-04 – 2014-08-05 (×2): 2 mg via INTRAVENOUS
  Filled 2014-08-03: qty 2
  Filled 2014-08-03 (×2): qty 1
  Filled 2014-08-03 (×2): qty 2
  Filled 2014-08-03 (×3): qty 1

## 2014-08-03 MED ORDER — DEXTROSE 5 % IV SOLN
15.0000 mg/kg/h | INTRAVENOUS | Status: DC
  Administered 2014-08-03: 15 mg/kg/h via INTRAVENOUS
  Filled 2014-08-03 (×3): qty 200

## 2014-08-03 MED ORDER — AMLODIPINE BESYLATE 5 MG PO TABS
5.0000 mg | ORAL_TABLET | Freq: Every day | ORAL | Status: DC
  Administered 2014-08-03 – 2014-08-06 (×4): 5 mg via ORAL
  Filled 2014-08-03 (×4): qty 1

## 2014-08-03 MED ORDER — ENOXAPARIN SODIUM 40 MG/0.4ML ~~LOC~~ SOLN
40.0000 mg | Freq: Every day | SUBCUTANEOUS | Status: DC
  Administered 2014-08-03 – 2014-08-06 (×4): 40 mg via SUBCUTANEOUS
  Filled 2014-08-03 (×4): qty 0.4

## 2014-08-03 MED ORDER — LORAZEPAM 1 MG PO TABS
1.0000 mg | ORAL_TABLET | Freq: Four times a day (QID) | ORAL | Status: AC | PRN
  Administered 2014-08-03: 1 mg via ORAL
  Filled 2014-08-03: qty 1

## 2014-08-03 MED ORDER — SUMATRIPTAN SUCCINATE 25 MG PO TABS
25.0000 mg | ORAL_TABLET | ORAL | Status: DC | PRN
  Filled 2014-08-03: qty 1

## 2014-08-03 MED ORDER — METOCLOPRAMIDE HCL 5 MG/ML IJ SOLN
10.0000 mg | Freq: Once | INTRAMUSCULAR | Status: AC
  Administered 2014-08-03: 10 mg via INTRAVENOUS
  Filled 2014-08-03: qty 2

## 2014-08-03 MED ORDER — ACETYLCYSTEINE LOAD VIA INFUSION
150.0000 mg/kg | Freq: Once | INTRAVENOUS | Status: AC
  Administered 2014-08-03: 11220 mg via INTRAVENOUS
  Filled 2014-08-03: qty 281

## 2014-08-03 MED ORDER — GABAPENTIN 100 MG PO CAPS
200.0000 mg | ORAL_CAPSULE | Freq: Three times a day (TID) | ORAL | Status: DC
  Administered 2014-08-03 – 2014-08-06 (×9): 200 mg via ORAL
  Filled 2014-08-03 (×12): qty 2

## 2014-08-03 MED ORDER — METOPROLOL TARTRATE 50 MG PO TABS
50.0000 mg | ORAL_TABLET | Freq: Two times a day (BID) | ORAL | Status: DC
  Administered 2014-08-03 – 2014-08-04 (×3): 50 mg via ORAL
  Filled 2014-08-03: qty 1
  Filled 2014-08-03: qty 2
  Filled 2014-08-03 (×2): qty 1

## 2014-08-03 MED ORDER — LORAZEPAM 2 MG/ML IJ SOLN
1.0000 mg | Freq: Four times a day (QID) | INTRAMUSCULAR | Status: AC | PRN
  Administered 2014-08-04 – 2014-08-05 (×2): 1 mg via INTRAVENOUS
  Filled 2014-08-03: qty 1

## 2014-08-03 MED ORDER — SODIUM CHLORIDE 0.9 % IV SOLN
INTRAVENOUS | Status: DC
  Administered 2014-08-03: 75 mL/h via INTRAVENOUS
  Administered 2014-08-04: 1000 mL via INTRAVENOUS
  Administered 2014-08-05: 09:00:00 via INTRAVENOUS

## 2014-08-03 MED ORDER — ALUM & MAG HYDROXIDE-SIMETH 200-200-20 MG/5ML PO SUSP
30.0000 mL | Freq: Once | ORAL | Status: AC
  Administered 2014-08-03: 30 mL via ORAL
  Filled 2014-08-03: qty 30

## 2014-08-03 MED ORDER — VITAMIN B-1 100 MG PO TABS
100.0000 mg | ORAL_TABLET | Freq: Every day | ORAL | Status: DC
  Administered 2014-08-03 – 2014-08-06 (×4): 100 mg via ORAL
  Filled 2014-08-03 (×4): qty 1

## 2014-08-03 MED ORDER — DULOXETINE HCL 30 MG PO CPEP
30.0000 mg | ORAL_CAPSULE | Freq: Every day | ORAL | Status: DC
  Administered 2014-08-03 – 2014-08-06 (×4): 30 mg via ORAL
  Filled 2014-08-03 (×6): qty 1

## 2014-08-03 MED ORDER — HEPARIN SODIUM (PORCINE) 5000 UNIT/ML IJ SOLN: 5000.0000 [IU] | Freq: Three times a day (TID) | INTRAMUSCULAR | Status: DC

## 2014-08-03 MED ORDER — ACETYLCYSTEINE LOAD VIA INFUSION: 150.0000 mg/kg | Freq: Once | INTRAVENOUS | Status: DC

## 2014-08-03 MED ORDER — HYDROXYZINE HCL 25 MG PO TABS
25.0000 mg | ORAL_TABLET | ORAL | Status: DC | PRN
  Filled 2014-08-03: qty 1

## 2014-08-03 NOTE — ED Notes (Signed)
Critical Tylenol level reported to Dr Claudine Mouton.

## 2014-08-03 NOTE — ED Notes (Signed)
Pt aware continuing to wait on Cymbalta from pharmacy.

## 2014-08-03 NOTE — ED Notes (Signed)
Pt wanded by security. 

## 2014-08-03 NOTE — ED Notes (Signed)
Spoke with Dr. Allyson Sabal, That Stat EKG was done, and in MUSE

## 2014-08-03 NOTE — ED Notes (Signed)
Lunch tray given. 
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
## 2014-08-03 NOTE — ED Notes (Signed)
Pt told this tech at triage that "if yaw don't let me kill myself now or you don't kill me, I WILL KILL MYSELF WHEN I GET OUT OF HERE, YAW CANT KEEP ME FOREVER"

## 2014-08-03 NOTE — ED Notes (Signed)
House sitter at bedside. 

## 2014-08-03 NOTE — ED Notes (Signed)
Sharyn Lull, Staffing Office, aware pt being transported to 3S03.

## 2014-08-03 NOTE — ED Notes (Signed)
Lab called and informed pt's CBC sent to lab has clotted and needs to be recollected.
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
## 2014-08-03 NOTE — ED Notes (Signed)
POISON CONTROL CALLED AND RECOMMENDED FOR 2ND EKG TO BE PERFORMED D/T PROLONGED QT ON 1ST.

## 2014-08-04 DIAGNOSIS — I10 Essential (primary) hypertension: Secondary | ICD-10-CM

## 2014-08-04 DIAGNOSIS — F1910 Other psychoactive substance abuse, uncomplicated: Secondary | ICD-10-CM

## 2014-08-04 DIAGNOSIS — T391X1A Poisoning by 4-Aminophenol derivatives, accidental (unintentional), initial encounter: Secondary | ICD-10-CM | POA: Insufficient documentation

## 2014-08-04 DIAGNOSIS — T391X2A Poisoning by 4-Aminophenol derivatives, intentional self-harm, initial encounter: Principal | ICD-10-CM

## 2014-08-04 DIAGNOSIS — F333 Major depressive disorder, recurrent, severe with psychotic symptoms: Secondary | ICD-10-CM

## 2014-08-04 DIAGNOSIS — F1410 Cocaine abuse, uncomplicated: Secondary | ICD-10-CM

## 2014-08-04 DIAGNOSIS — F1010 Alcohol abuse, uncomplicated: Secondary | ICD-10-CM

## 2014-08-04 DIAGNOSIS — F209 Schizophrenia, unspecified: Secondary | ICD-10-CM

## 2014-08-04 DIAGNOSIS — T450X2A Poisoning by antiallergic and antiemetic drugs, intentional self-harm, initial encounter: Secondary | ICD-10-CM

## 2014-08-04 LAB — COMPREHENSIVE METABOLIC PANEL
ALBUMIN: 3.5 g/dL (ref 3.5–5.2)
ALBUMIN: 3.6 g/dL (ref 3.5–5.2)
ALK PHOS: 40 U/L (ref 39–117)
ALT: 57 U/L — AB (ref 0–53)
ALT: 50 U/L (ref 0–53)
ANION GAP: 7 (ref 5–15)
AST: 46 U/L — ABNORMAL HIGH (ref 0–37)
AST: 44 U/L — AB (ref 0–37)
Alkaline Phosphatase: 44 U/L (ref 39–117)
Anion gap: 6 (ref 5–15)
BILIRUBIN TOTAL: 1 mg/dL (ref 0.3–1.2)
BUN: 5 mg/dL — AB (ref 6–23)
CALCIUM: 9 mg/dL (ref 8.4–10.5)
CALCIUM: 9.1 mg/dL (ref 8.4–10.5)
CHLORIDE: 105 meq/L (ref 96–112)
CHLORIDE: 109 meq/L (ref 96–112)
CO2: 25 mmol/L (ref 19–32)
CO2: 22 mmol/L (ref 19–32)
CREATININE: 0.8 mg/dL (ref 0.50–1.35)
CREATININE: 0.82 mg/dL (ref 0.50–1.35)
GFR calc non Af Amer: >90 mL/min (ref >90)
Glucose, Bld: 85 mg/dL (ref 70–99)
Glucose, Bld: 89 mg/dL (ref 70–99)
POTASSIUM: 3.7 mmol/L (ref 3.5–5.1)
Potassium: 4.1 mmol/L (ref 3.5–5.1)
SODIUM: 136 mmol/L (ref 135–145)
Sodium: 138 mmol/L (ref 135–145)
TOTAL PROTEIN: 6.6 g/dL (ref 6.0–8.3)
TOTAL PROTEIN: 6.7 g/dL (ref 6.0–8.3)
Total Bilirubin: 1.2 mg/dL (ref 0.3–1.2)

## 2014-08-04 LAB — GLUCOSE, CAPILLARY
GLUCOSE-CAPILLARY: 84 mg/dL (ref 70–99)
GLUCOSE-CAPILLARY: 62 mg/dL — AB (ref 70–99)
GLUCOSE-CAPILLARY: 91 mg/dL (ref 70–99)
GLUCOSE-CAPILLARY: 77 mg/dL (ref 70–99)
Glucose-Capillary: 82 mg/dL (ref 70–99)
Glucose-Capillary: 91 mg/dL (ref 70–99)
Glucose-Capillary: 92 mg/dL (ref 70–99)

## 2014-08-04 LAB — RAPID HIV SCREEN (WH-MAU): Rapid HIV Screen: NONREACTIVE

## 2014-08-04 LAB — AMMONIA: AMMONIA: 30 umol/L (ref 11–32)

## 2014-08-04 LAB — HEPATITIS PANEL, ACUTE
HCV Ab: NEGATIVE
HEP A IGM: NONREACTIVE
HEP B C IGM: NONREACTIVE
HEP B S AG: NEGATIVE

## 2014-08-04 LAB — CBC
HEMATOCRIT: 43.7 % (ref 39.0–52.0)
Hemoglobin: 15.9 g/dL (ref 13.0–17.0)
MCH: 33.1 pg (ref 26.0–34.0)
MCHC: 36.4 g/dL — AB (ref 30.0–36.0)
MCV: 91 fL (ref 78.0–100.0)
PLATELETS: 251 10*3/uL (ref 150–400)
RBC: 4.8 MIL/uL (ref 4.22–5.81)
RDW: 13 % (ref 11.5–15.5)
WBC: 4.8 10*3/uL (ref 4.0–10.5)

## 2014-08-04 LAB — PROTIME-INR
INR: 1.06 (ref 0.00–1.49)
Prothrombin Time: 14 seconds (ref 11.6–15.2)

## 2014-08-04 LAB — ACETAMINOPHEN LEVEL: Acetaminophen (Tylenol), Serum: <10 ug/mL — ABNORMAL LOW (ref 10–30)

## 2014-08-04 MED ORDER — RISPERIDONE 3 MG PO TABS
3.0000 mg | ORAL_TABLET | Freq: Every day | ORAL | Status: DC
  Administered 2014-08-04: 3 mg via ORAL
  Filled 2014-08-04: qty 1

## 2014-08-04 MED ORDER — METOPROLOL TARTRATE 1 MG/ML IV SOLN
5.0000 mg | Freq: Once | INTRAVENOUS | Status: AC
  Administered 2014-08-04: 5 mg via INTRAVENOUS
  Filled 2014-08-04: qty 5

## 2014-08-04 MED ORDER — METOPROLOL TARTRATE 50 MG PO TABS
75.0000 mg | ORAL_TABLET | Freq: Two times a day (BID) | ORAL | Status: DC
  Administered 2014-08-04 – 2014-08-05 (×2): 75 mg via ORAL
  Filled 2014-08-04 (×3): qty 1

## 2014-08-04 MED ORDER — METOPROLOL TARTRATE 1 MG/ML IV SOLN
INTRAVENOUS | Status: AC
  Filled 2014-08-04: qty 5

## 2014-08-04 MED ORDER — RISPERIDONE 1 MG PO TABS
1.0000 mg | ORAL_TABLET | Freq: Every day | ORAL | Status: DC
  Administered 2014-08-05 – 2014-08-06 (×2): 1 mg via ORAL
  Filled 2014-08-04 (×2): qty 1

## 2014-08-04 MED ORDER — CHLORDIAZEPOXIDE HCL 25 MG PO CAPS
50.0000 mg | ORAL_CAPSULE | Freq: Once | ORAL | Status: AC
  Administered 2014-08-04: 50 mg via ORAL
  Filled 2014-08-04 (×2): qty 2

## 2014-08-04 MED ORDER — LORAZEPAM 2 MG/ML IJ SOLN
4.0000 mg | Freq: Once | INTRAMUSCULAR | Status: AC
  Administered 2014-08-04: 4 mg via INTRAVENOUS
  Filled 2014-08-04: qty 2

## 2014-08-04 NOTE — Progress Notes (Signed)
Pt becoming more agitated and restless.  CIWA 63.  Pt continues to get OOB and walk about room, states he needs to leave and cannot be admitted.  Suicide sitter at bs.  Attempted to reassure pt and reorient.  Pt continues to be agitated.  Jonette Eva paged.  Orders received.

## 2014-08-04 NOTE — Progress Notes (Signed)
Utilization review completed.  

## 2014-08-04 NOTE — Progress Notes (Addendum)
[place] TEAM 1 - Stepdown/ICU TEAM Progress Note  [name] IRC:[id number] DOB: [birth year] DOA: 08/02/2014 PCP: Elbert Ewings, FNP  Admit HPI / Brief Narrative: 33 y.o. BM PMHx  schizophrenia and bipolar disorder, seizures, HTN, depression, pancreatitis, liver cirrhosis, CAD, hypercholesteremia, and cancer of left kidney, polysubstance abuse,, who presents with overdose of acetaminophen (suicide attempt).  Patient states that at about 8:00 to 9:00 PM, he took 16 pills of tylenol (500 mg/tab) in trying to kill himself. Her mother stated to ED that patient took 18 pills of tylenol and 25 mg diphenhydramine. He also reported that he has drank two bottles of wine tonight. He feels nauseated, and vomited twice. He has mild abdominal pain. No fever or chills. He still has suicidal, but not homicidal ideations when I evaluated pt in ED. He states he is going to kill himself no matter what, and there is nothing or anyone can do to stop it. Patient denies fever, chills, cough, chest pain, SOB, diarrhea, constipation, dysuria, urgency, frequency, hematuria, skin rashes, joint pain or leg swelling.  In ED, acetaminophen level 181 at approximately 4 hours after he took the pills. AST 73, ALT 77, and they are 0.95, lactate 1.89. Lipase is 30, alcohol level 322. Patient is admitted to inpatient for further evaluation and treatment.   HPI/Subjective: 1/17 A/O 1 (does not know where, when, why), combative, continues to state he is leaving and attempts to leave the ward. States he will kill himself no matter what we do, and that he is not going back to [place].  Assessment/Plan: Suicide attempt -Patient requires inpatient psychiatric care when medically stable -Psychiatry consulted  Psychotic (hallucinations/delusions) -Risperdal 3 mg daily; least likely to exacerbate QT prolongation -1/17 ADDENDUM; decrease Risperdal to 1 mg daily -Continue 5 point restraints  Multiple psychiatric issues:    Schizophrenia, bipolar, depression. Currently has suicidal,ideation. - continue Cymbalta - Hold Abilify due to prolonged QTc - sitter at bedside   Acetaminophen toxicity:  -Initial acetaminophen level was 181; last acetaminophen level <10  -Liver enzymes still slightly elevated but trending down  -INR within normal limit   -DC IV Acetylcysteine  -Avoid liver toxic medication  -check acetaminophen level at 1700 -avoid Zofran due to liver metabolization and QTc=502 -Hydroxyzine for nausea -Continue NS 75 cc/h  - Neuro check every 4 hour -Acute hepatitis panel pending -HIV pending -monitor for any urticaria, angioedema and hypotension while he is on IV acetylcysteine.   Hypertension: -Metoprolol IV 5 mg 1 -Increase metoprolol to 75 mg  BID -Continue amlodipine 5 mg daily   Polysubstance abuse:  -UDS negative -CIWA protocol -Nicotin patch  Alcohol abuse  -On admission alcohol level 322    Code Status: FULL Family Communication: no family present at time of exam Disposition Plan: Discharge to inpatient psychiatric facility when medically stable    Consultants: Psychiatric consult pending   Procedure/Significant Events:    Culture Acute hepatitis panel pending HIV panel pending RPR pending  Antibiotics: None  DVT prophylaxis: Lovenox   Devices NA   LINES / TUBES:  NA    Continuous Infusions: . sodium chloride 1,000 mL (08/04/14 0618)  . acetylcysteine 15 mg/kg/hr (08/04/14 0800)    Objective: VITAL SIGNS: Temp: 98.3 F (36.8 C) (01/17 0710) Temp Source: Oral (01/17 0710) BP: 157/109 mmHg (01/17 0710) Pulse Rate: 92 (01/17 0710) SPO2; FIO2:   Intake/Output Summary (Last 24 hours) at 08/04/14 1016 Last data filed at 08/04/14 0913  Gross per 24 hour  Intake 2403.4 ml  Output    950 ml  Net 1453.4 ml     Exam: General: A/O 1 (does not know where, when, why), combative, continues to state he is leaving and attempts to leave  the ward, No acute respiratory distress Lungs: Clear to auscultation bilaterally without wheezes or crackles Cardiovascular: Regular rate and rhythm without murmur gallop or rub normal S1 and S2 Abdomen: Nontender, nondistended, soft, bowel sounds positive, no rebound, no ascites, no appreciable mass Extremities: No significant cyanosis, clubbing, or edema bilateral lower extremities  Data Reviewed: Basic Metabolic Panel:  Recent Labs Lab 08/02/14 2345 08/03/14 0024 08/03/14 0821 08/04/14 0305  NA 141  --  141 136  K 4.0  --  3.7 3.7  CL 105  --  106 105  CO2 25  --  23 25  GLUCOSE 77  --  73 85  BUN 9  --  7 5*  CREATININE 0.94  --  0.80 0.80  CALCIUM 9.1  --  8.3* 9.0  MG  --  1.9  --   --    Liver Function Tests:  Recent Labs Lab 08/02/14 2345 08/03/14 0821 08/04/14 0305  AST 73* 58* 46*  ALT 77* 64* 57*  ALKPHOS 47 34* 40  BILITOT 0.5 0.4 1.0  PROT 7.3 6.0 6.6  ALBUMIN 4.1 3.2* 3.5    Recent Labs Lab 08/02/14 2345  LIPASE 30   No results for input(s): AMMONIA in the last 168 hours. CBC:  Recent Labs Lab 08/03/14 0024 08/03/14 0821 08/04/14 0305  WBC 7.4 3.5* 4.8  NEUTROABS 1.4*  --   --   HGB 15.4 14.2 15.9  HCT 43.5 40.9 43.7  MCV 92.8 91.7 91.0  PLT 243 226 251   Cardiac Enzymes: No results for input(s): CKTOTAL, CKMB, CKMBINDEX, TROPONINI in the last 168 hours. BNP (last 3 results) No results for input(s): PROBNP in the last 8760 hours. CBG:  Recent Labs Lab 08/03/14 1617 08/03/14 1919 08/03/14 2353 08/04/14 0306 08/04/14 0759  GLUCAP 118* 107* 82 91 84    Recent Results (from the past 240 hour(s))  MRSA PCR Screening     Status: None   Collection Time: 08/03/14  5:23 PM  Result Value Ref Range Status   MRSA by PCR NEGATIVE NEGATIVE Final    Comment:        The GeneXpert MRSA Assay (FDA approved for NASAL specimens only), is one component of a comprehensive MRSA colonization surveillance program. It is not intended to  diagnose MRSA infection nor to guide or monitor treatment for MRSA infections.      Studies:  Recent x-ray studies have been reviewed in detail by the Attending Physician  Scheduled Meds:  Scheduled Meds: . amLODipine  5 mg Oral Daily  . DULoxetine  30 mg Oral Daily  . enoxaparin (LOVENOX) injection  40 mg Subcutaneous Daily  . folic acid  1 mg Oral Daily  . gabapentin  200 mg Oral TID  . LORazepam  0-4 mg Intravenous Q6H   Followed by  . [START ON 08/05/2014] LORazepam  0-4 mg Intravenous Q12H  . metoprolol  50 mg Oral BID  . mirtazapine  15 mg Oral QHS  . multivitamin with minerals  1 tablet Oral Daily  . nicotine  14 mg Transdermal Daily  . pantoprazole  40 mg Oral BID  . sodium chloride  3 mL Intravenous Q12H  . thiamine  100 mg Oral Daily   Or  . thiamine  100 mg Intravenous Daily  Time spent on care of this patient: 40 mins   Allie Bossier , MD   [facility] Office  [phone] Pager [phone]  On-Call/Text Page:      [email or web address]      password [email or web address]  If 7PM-7AM, please contact night-coverage [email or web address] Password [email or web address] 08/04/2014, 10:16 AM   LOS: 2 days

## 2014-08-04 NOTE — Progress Notes (Signed)
Attempted to call pts mother, Kalman Shan, to inform her of the necessity to place pt in restraints. Was unable to get in touch with her.

## 2014-08-04 NOTE — Consult Note (Signed)
[facility] Face-to-Face Psychiatry Consult   Reason for Consult:  Suicide attempt Referring Physician:  Dr. Orvis Brill [name] is an 34 y.o. male. Total Time spent with patient: 20 minutes  Assessment: AXIS I:  Schizophrenia, substance abuse AXIS II:  Cluster B Traits AXIS III:   Past Medical History  Diagnosis Date  . Seizures   . Hypertension   . Depression   . Pancreatitis   . Liver cirrhosis   . Coronary artery disease   . Angina   . Shortness of breath   . Headache(784.0)   . Peripheral vascular disease April 2011    Left Pop  . Hypercholesteremia   . Schizophrenia   . Bipolar 1 disorder   . Cancer of kidney dx'd 04/2013    lt nephrectomy  . Breast CA dx'd 2009    bil w/ bil masectomy and oral meds  . H/O suicide attempt 2015    overdose   AXIS IV:  economic problems and problems with primary support group AXIS V:  1-10 persistent dangerousness to self and others present  Plan:  Recommend psychiatric Inpatient admission when medically cleared.  Subjective:   [name] is a 34 y.o. male patient admitted with suicide attempt on acetaminophen.  HPI: Patient is a 34 year old black male with a history of schizophrenia bipolar disorder seizures pancreatitis liver cirrhosis and cancer of left kidney and polysubstance abuse. Apparently he took an overdose of acetaminophen. His mother had told the ED that patient took 18 pills of Tylenol and 25 mg diphenhydramine. He also drank 2 bottles of wine. He had told his hospitalist here that he was going to kill himself no matter what and nothing was going to stop it. Apparently he was very agitated prior to my arrival and had received Resporal 3 mg. When I went in to see him he was very difficult to arouse and was mumbling in a garbled fashion and difficult to assess. HPI Elements:   Location:  Global. Quality:  Severe. Severity:  Severe. Timing:  Few days. Duration:  Years. Context:  Alcohol abuse.  Past Psychiatric History: Past  Medical History  Diagnosis Date  . Seizures   . Hypertension   . Depression   . Pancreatitis   . Liver cirrhosis   . Coronary artery disease   . Angina   . Shortness of breath   . Headache(784.0)   . Peripheral vascular disease April 2011    Left Pop  . Hypercholesteremia   . Schizophrenia   . Bipolar 1 disorder   . Cancer of kidney dx'd 04/2013    lt nephrectomy  . Breast CA dx'd 2009    bil w/ bil masectomy and oral meds  . H/O suicide attempt 2015    overdose    reports that he has been smoking Cigarettes.  He has a .4 pack-year smoking history. He has never used smokeless tobacco. He reports that he drinks alcohol. His drug history is not on file. Family History  Problem Relation Age of Onset  . Stroke Other      Living Arrangements: Other relatives   Abuse/Neglect [facility]) Physical Abuse: Denies Verbal Abuse: Denies Sexual Abuse: Denies Allergies:   Allergies  Allergen Reactions  . Codeine Hives, Itching and Swelling  . Depakote [Divalproex Sodium] Other (See Comments)    "Bug out and hallucinate"  . Morphine Itching  . Penicillins Swelling  . Hydrocodone-Acetaminophen Itching and Rash     Objective: Blood pressure 148/98, pulse 92, temperature 98.7 F (37.1  C), temperature source Oral, resp. rate 26, height '5\' 6"'  (1.676 m), weight 165 lb (74.844 kg), SpO2 100 %.Body mass index is 26.64 kg/(m^2). Results for orders placed or performed during the hospital encounter of 08/02/14 (from the past 72 hour(s))  Acetaminophen level     Status: Abnormal   Collection Time: 08/02/14 11:40 PM  Result Value Ref Range   Acetaminophen (Tylenol), Serum 181.1 (HH) 10 - 30 ug/mL    Comment:        THERAPEUTIC CONCENTRATIONS VARY SIGNIFICANTLY. A RANGE OF 10-30 ug/mL MAY BE AN EFFECTIVE CONCENTRATION FOR MANY PATIENTS. HOWEVER, SOME ARE BEST TREATED AT CONCENTRATIONS OUTSIDE THIS RANGE. ACETAMINOPHEN CONCENTRATIONS >150 ug/mL AT 4 HOURS AFTER INGESTION AND >50 ug/mL AT  12 HOURS AFTER INGESTION ARE OFTEN ASSOCIATED WITH TOXIC REACTIONS. REPEATED TO VERIFY CRITICAL RESULT CALLED TO, READ BACK BY AND VERIFIED WITHChristean Grief [phone] [facility]   Comprehensive metabolic panel     Status: Abnormal   Collection Time: 08/02/14 11:45 PM  Result Value Ref Range   Sodium 141 135 - 145 mmol/L    Comment: Please note change in reference range.   Potassium 4.0 3.5 - 5.1 mmol/L    Comment: Please note change in reference range.   Chloride 105 96 - 112 mEq/L   CO2 25 19 - 32 mmol/L   Glucose, Bld 77 70 - 99 mg/dL   BUN 9 6 - 23 mg/dL   Creatinine, Ser 0.94 0.50 - 1.35 mg/dL   Calcium 9.1 8.4 - 10.5 mg/dL   Total Protein 7.3 6.0 - 8.3 g/dL   Albumin 4.1 3.5 - 5.2 g/dL   AST 73 (H) 0 - 37 U/L   ALT 77 (H) 0 - 53 U/L   Alkaline Phosphatase 47 39 - 117 U/L   Total Bilirubin 0.5 0.3 - 1.2 mg/dL   GFR calc non Af Amer >90 >90 mL/min   GFR calc Af Amer >90 >90 mL/min    Comment: (NOTE) The eGFR has been calculated using the CKD EPI equation. This calculation has not been validated in all clinical situations. eGFR's persistently <90 mL/min signify possible Chronic Kidney Disease.    Anion gap 11 5 - 15  Lipase, blood     Status: None   Collection Time: 08/02/14 11:45 PM  Result Value Ref Range   Lipase 30 11 - 59 U/L  Ethanol     Status: Abnormal   Collection Time: 08/02/14 11:45 PM  Result Value Ref Range   Alcohol, Ethyl (B) 322 (H) 0 - 9 mg/dL    Comment:        LOWEST DETECTABLE LIMIT FOR SERUM ALCOHOL IS 11 mg/dL FOR MEDICAL PURPOSES ONLY   Salicylate level     Status: None   Collection Time: 08/02/14 11:45 PM  Result Value Ref Range   Salicylate Lvl <1.0 2.8 - 20.0 mg/dL  Protime-INR     Status: None   Collection Time: 08/02/14 11:45 PM  Result Value Ref Range   Prothrombin Time 12.7 11.6 - 15.2 seconds   INR 0.95 0.00 - 1.49  I-Stat CG4 Lactic Acid, ED     Status: None   Collection Time: 08/02/14 11:55 PM  Result Value Ref Range    Lactic Acid, Venous 1.89 0.5 - 2.2 mmol/L  CBC with Differential     Status: Abnormal   Collection Time: 08/03/14 12:24 AM  Result Value Ref Range   WBC 7.4 4.0 - 10.5 K/uL   RBC 4.69 4.22 -  5.81 MIL/uL   Hemoglobin 15.4 13.0 - 17.0 g/dL   HCT 43.5 39.0 - 52.0 %   MCV 92.8 78.0 - 100.0 fL   MCH 32.8 26.0 - 34.0 pg   MCHC 35.4 30.0 - 36.0 g/dL   RDW 13.6 11.5 - 15.5 %   Platelets 243 150 - 400 K/uL   Neutrophils Relative % 19 (L) 43 - 77 %   Neutro Abs 1.4 (L) 1.7 - 7.7 K/uL   Lymphocytes Relative 72 (H) 12 - 46 %   Lymphs Abs 5.3 (H) 0.7 - 4.0 K/uL   Monocytes Relative 6 3 - 12 %   Monocytes Absolute 0.4 0.1 - 1.0 K/uL   Eosinophils Relative 3 0 - 5 %   Eosinophils Absolute 0.2 0.0 - 0.7 K/uL   Basophils Relative 0 0 - 1 %   Basophils Absolute 0.0 0.0 - 0.1 K/uL  Magnesium     Status: None   Collection Time: 08/03/14 12:24 AM  Result Value Ref Range   Magnesium 1.9 1.5 - 2.5 mg/dL  Urine rapid drug screen (hosp performed)     Status: None   Collection Time: 08/03/14  3:28 AM  Result Value Ref Range   Opiates NONE DETECTED NONE DETECTED   Cocaine NONE DETECTED NONE DETECTED   Benzodiazepines NONE DETECTED NONE DETECTED   Amphetamines NONE DETECTED NONE DETECTED   Tetrahydrocannabinol NONE DETECTED NONE DETECTED   Barbiturates NONE DETECTED NONE DETECTED    Comment:        DRUG SCREEN FOR MEDICAL PURPOSES ONLY.  IF CONFIRMATION IS NEEDED FOR ANY PURPOSE, NOTIFY LAB WITHIN 5 DAYS.        LOWEST DETECTABLE LIMITS FOR URINE DRUG SCREEN Drug Class       Cutoff (ng/mL) Amphetamine      1000 Barbiturate      200 Benzodiazepine   007 Tricyclics       121 Opiates          300 Cocaine          300 THC              50   Acetaminophen level     Status: Abnormal   Collection Time: 08/03/14  8:21 AM  Result Value Ref Range   Acetaminophen (Tylenol), Serum 32.4 (H) 10 - 30 ug/mL    Comment:        THERAPEUTIC CONCENTRATIONS VARY SIGNIFICANTLY. A RANGE OF 10-30 ug/mL MAY BE  AN EFFECTIVE CONCENTRATION FOR MANY PATIENTS. HOWEVER, SOME ARE BEST TREATED AT CONCENTRATIONS OUTSIDE THIS RANGE. ACETAMINOPHEN CONCENTRATIONS >150 ug/mL AT 4 HOURS AFTER INGESTION AND >50 ug/mL AT 12 HOURS AFTER INGESTION ARE OFTEN ASSOCIATED WITH TOXIC REACTIONS.   CBC     Status: Abnormal   Collection Time: 08/03/14  8:21 AM  Result Value Ref Range   WBC 3.5 (L) 4.0 - 10.5 K/uL   RBC 4.46 4.22 - 5.81 MIL/uL   Hemoglobin 14.2 13.0 - 17.0 g/dL   HCT 40.9 39.0 - 52.0 %   MCV 91.7 78.0 - 100.0 fL   MCH 31.8 26.0 - 34.0 pg   MCHC 34.7 30.0 - 36.0 g/dL   RDW 13.4 11.5 - 15.5 %   Platelets 226 150 - 400 K/uL  Comprehensive metabolic panel     Status: Abnormal   Collection Time: 08/03/14  8:21 AM  Result Value Ref Range   Sodium 141 135 - 145 mmol/L    Comment: Please note change in reference range.  Potassium 3.7 3.5 - 5.1 mmol/L    Comment: Please note change in reference range.   Chloride 106 96 - 112 mEq/L   CO2 23 19 - 32 mmol/L   Glucose, Bld 73 70 - 99 mg/dL   BUN 7 6 - 23 mg/dL   Creatinine, Ser 0.80 0.50 - 1.35 mg/dL   Calcium 8.3 (L) 8.4 - 10.5 mg/dL   Total Protein 6.0 6.0 - 8.3 g/dL   Albumin 3.2 (L) 3.5 - 5.2 g/dL   AST 58 (H) 0 - 37 U/L   ALT 64 (H) 0 - 53 U/L   Alkaline Phosphatase 34 (L) 39 - 117 U/L   Total Bilirubin 0.4 0.3 - 1.2 mg/dL   GFR calc non Af Amer >90 >90 mL/min   GFR calc Af Amer >90 >90 mL/min    Comment: (NOTE) The eGFR has been calculated using the CKD EPI equation. This calculation has not been validated in all clinical situations. eGFR's persistently <90 mL/min signify possible Chronic Kidney Disease.    Anion gap 12 5 - 15  CBG monitoring, ED     Status: None   Collection Time: 08/03/14  8:29 AM  Result Value Ref Range   Glucose-Capillary 86 70 - 99 mg/dL   Comment 1 Notify RN   CBG monitoring, ED     Status: None   Collection Time: 08/03/14 11:59 AM  Result Value Ref Range   Glucose-Capillary 78 70 - 99 mg/dL  CBG  monitoring, ED     Status: Abnormal   Collection Time: 08/03/14  4:17 PM  Result Value Ref Range   Glucose-Capillary 118 (H) 70 - 99 mg/dL   Comment 1 Notify RN   MRSA PCR Screening     Status: None   Collection Time: 08/03/14  5:23 PM  Result Value Ref Range   MRSA by PCR NEGATIVE NEGATIVE    Comment:        The GeneXpert MRSA Assay (FDA approved for NASAL specimens only), is one component of a comprehensive MRSA colonization surveillance program. It is not intended to diagnose MRSA infection nor to guide or monitor treatment for MRSA infections.   Glucose, capillary     Status: Abnormal   Collection Time: 08/03/14  7:19 PM  Result Value Ref Range   Glucose-Capillary 107 (H) 70 - 99 mg/dL   Comment 1 Notify RN   Glucose, capillary     Status: None   Collection Time: 08/03/14 11:53 PM  Result Value Ref Range   Glucose-Capillary 82 70 - 99 mg/dL   Comment 1 Notify RN   Comprehensive metabolic panel     Status: Abnormal   Collection Time: 08/04/14  3:05 AM  Result Value Ref Range   Sodium 136 135 - 145 mmol/L    Comment: Please note change in reference range.   Potassium 3.7 3.5 - 5.1 mmol/L    Comment: Please note change in reference range.   Chloride 105 96 - 112 mEq/L   CO2 25 19 - 32 mmol/L   Glucose, Bld 85 70 - 99 mg/dL   BUN 5 (L) 6 - 23 mg/dL   Creatinine, Ser 0.80 0.50 - 1.35 mg/dL   Calcium 9.0 8.4 - 10.5 mg/dL   Total Protein 6.6 6.0 - 8.3 g/dL   Albumin 3.5 3.5 - 5.2 g/dL   AST 46 (H) 0 - 37 U/L   ALT 57 (H) 0 - 53 U/L   Alkaline Phosphatase 40 39 - 117 U/L  Total Bilirubin 1.0 0.3 - 1.2 mg/dL   GFR calc non Af Amer >90 >90 mL/min   GFR calc Af Amer >90 >90 mL/min    Comment: (NOTE) The eGFR has been calculated using the CKD EPI equation. This calculation has not been validated in all clinical situations. eGFR's persistently <90 mL/min signify possible Chronic Kidney Disease.    Anion gap 6 5 - 15  Protime-INR     Status: None   Collection Time:  08/04/14  3:05 AM  Result Value Ref Range   Prothrombin Time 14.0 11.6 - 15.2 seconds   INR 1.06 0.00 - 1.49  CBC     Status: Abnormal   Collection Time: 08/04/14  3:05 AM  Result Value Ref Range   WBC 4.8 4.0 - 10.5 K/uL   RBC 4.80 4.22 - 5.81 MIL/uL   Hemoglobin 15.9 13.0 - 17.0 g/dL   HCT 43.7 39.0 - 52.0 %   MCV 91.0 78.0 - 100.0 fL   MCH 33.1 26.0 - 34.0 pg   MCHC 36.4 (H) 30.0 - 36.0 g/dL   RDW 13.0 11.5 - 15.5 %   Platelets 251 150 - 400 K/uL  Acetaminophen level     Status: Abnormal   Collection Time: 08/04/14  3:05 AM  Result Value Ref Range   Acetaminophen (Tylenol), Serum <10.0 (L) 10 - 30 ug/mL    Comment:        THERAPEUTIC CONCENTRATIONS VARY SIGNIFICANTLY. A RANGE OF 10-30 ug/mL MAY BE AN EFFECTIVE CONCENTRATION FOR MANY PATIENTS. HOWEVER, SOME ARE BEST TREATED AT CONCENTRATIONS OUTSIDE THIS RANGE. ACETAMINOPHEN CONCENTRATIONS >150 ug/mL AT 4 HOURS AFTER INGESTION AND >50 ug/mL AT 12 HOURS AFTER INGESTION ARE OFTEN ASSOCIATED WITH TOXIC REACTIONS.   Glucose, capillary     Status: None   Collection Time: 08/04/14  3:06 AM  Result Value Ref Range   Glucose-Capillary 91 70 - 99 mg/dL   Comment 1 Notify RN   Glucose, capillary     Status: None   Collection Time: 08/04/14  7:59 AM  Result Value Ref Range   Glucose-Capillary 84 70 - 99 mg/dL   Comment 1 Documented in Chart    Comment 2 Notify RN   Rapid HIV screen [facility])     Status: None   Collection Time: 08/04/14 11:23 AM  Result Value Ref Range   SUDS Rapid HIV Screen NON REACTIVE NON REACTIVE  Glucose, capillary     Status: None   Collection Time: 08/04/14 12:14 PM  Result Value Ref Range   Glucose-Capillary 92 70 - 99 mg/dL   Comment 1 Documented in Chart    Comment 2 Notify RN    Labs are reviewed and are pertinent for blood alcohol level 322  Current Facility-Administered Medications  Medication Dose Route Frequency Provider Last Rate Last Dose  . 0.9 %  sodium chloride infusion    Intravenous Continuous Ivor Costa, MD 75 mL/hr at 08/04/14 0618 1,000 mL at 08/04/14 0618  . amLODipine (NORVASC) tablet 5 mg  5 mg Oral Daily Ivor Costa, MD   5 mg at 08/04/14 0846  . DULoxetine (CYMBALTA) DR capsule 30 mg  30 mg Oral Daily Ivor Costa, MD   30 mg at 08/04/14 0846  . enoxaparin (LOVENOX) injection 40 mg  40 mg Subcutaneous Daily Thuy Dien Dang, RPH   40 mg at 08/04/14 0845  . folic acid (FOLVITE) tablet 1 mg  1 mg Oral Daily Ivor Costa, MD   1 mg at 08/04/14 0846  .  gabapentin (NEURONTIN) capsule 200 mg  200 mg Oral TID Ivor Costa, MD   200 mg at 08/04/14 0846  . hydrOXYzine (ATARAX/VISTARIL) tablet 25 mg  25 mg Oral Q4H PRN Ivor Costa, MD      . LORazepam (ATIVAN) injection 0-4 mg  0-4 mg Intravenous Q6H Ivor Costa, MD   4 mg at 08/04/14 1316   Followed by  . [START ON 08/05/2014] LORazepam (ATIVAN) injection 0-4 mg  0-4 mg Intravenous Q12H Ivor Costa, MD      . LORazepam (ATIVAN) tablet 1 mg  1 mg Oral Q6H PRN Ivor Costa, MD   1 mg at 08/03/14 2310   Or  . LORazepam (ATIVAN) injection 1 mg  1 mg Intravenous Q6H PRN Ivor Costa, MD   1 mg at 08/04/14 1058  . metoprolol tartrate (LOPRESSOR) tablet 75 mg  75 mg Oral BID Allie Bossier, MD      . mirtazapine (REMERON SOL-TAB) disintegrating tablet 15 mg  15 mg Oral QHS Ivor Costa, MD   15 mg at 08/03/14 2205  . multivitamin with minerals tablet 1 tablet  1 tablet Oral Daily Ivor Costa, MD   1 tablet at 08/04/14 0846  . nicotine (NICODERM CQ - dosed in mg/24 hours) patch 14 mg  14 mg Transdermal Daily Ivor Costa, MD   14 mg at 08/03/14 1730  . pantoprazole (PROTONIX) EC tablet 40 mg  40 mg Oral BID Reyne Dumas, MD   40 mg at 08/04/14 0847  . risperiDONE (RISPERDAL) tablet 3 mg  3 mg Oral Daily Allie Bossier, MD   3 mg at 08/04/14 1316  . sodium chloride 0.9 % injection 3 mL  3 mL Intravenous Q12H Ivor Costa, MD   3 mL at 08/04/14 0848  . SUMAtriptan (IMITREX) tablet 25 mg  25 mg Oral Q2H PRN Ivor Costa, MD      . thiamine (VITAMIN B-1) tablet 100  mg  100 mg Oral Daily Ivor Costa, MD   100 mg at 08/04/14 0846   Or  . thiamine (B-1) injection 100 mg  100 mg Intravenous Daily Ivor Costa, MD        Psychiatric Specialty Exam:     Blood pressure 148/98, pulse 92, temperature 98.7 F (37.1 C), temperature source Oral, resp. rate 26, height '5\' 6"'  (1.676 m), weight 165 lb (74.844 kg), SpO2 100 %.Body mass index is 26.64 kg/(m^2).  General Appearance: In 5 point restraint very drowsy unable to arouse  Eye Contact::  None  Speech:  Garbled and Slurred  Volume:  Decreased  Mood:  Unable to assess  Affect:  Inappropriate and Labile  Thought Process:  Unable to assess  Orientation:  Negative  Thought Content:  Unable to assess  Suicidal Thoughts:  Yes.  with intent/plan  Homicidal Thoughts:  No  Memory:  Not assessed  Judgement:  Other:  Unable to assess  Insight:  Lacking  Psychomotor Activity:  Increased  Concentration:  Poor  Recall:  Unable to assess  Fund of Knowledge:Poor  Language: Poor  Akathisia:  No  Handed:  Right  AIMS (if indicated):     Assets:  Social Support  Sleep:      Musculoskeletal: Strength & Muscle Tone: within normal limits Gait & Station: unable to assess Patient leans: N/A  Treatment Plan Summary: Daily contact with patient to assess and evaluate symptoms and progress in treatment Medication management Made need to back off on Risperdal tomorrow as patient is oversedated. Would predict that  when the alcohol wears off feel be more coherent and more cooperative. Please reconsult psychiatry tomorrow so he can be followed for possible admission ROSS, [facility] 08/04/2014 4:07 PM

## 2014-08-05 DIAGNOSIS — T391X2D Poisoning by 4-Aminophenol derivatives, intentional self-harm, subsequent encounter: Secondary | ICD-10-CM

## 2014-08-05 LAB — COMPREHENSIVE METABOLIC PANEL
ALK PHOS: 42 U/L (ref 39–117)
ALT: 46 U/L (ref 0–53)
ANION GAP: 5 (ref 5–15)
AST: 42 U/L — ABNORMAL HIGH (ref 0–37)
Albumin: 3.4 g/dL — ABNORMAL LOW (ref 3.5–5.2)
BUN: <5 mg/dL — ABNORMAL LOW (ref 6–23)
CALCIUM: 9 mg/dL (ref 8.4–10.5)
CO2: 24 mmol/L (ref 19–32)
CREATININE: 0.85 mg/dL (ref 0.50–1.35)
Chloride: 109 mEq/L (ref 96–112)
GFR calc Af Amer: >90 mL/min (ref >90)
Glucose, Bld: 87 mg/dL (ref 70–99)
Potassium: 4 mmol/L (ref 3.5–5.1)
SODIUM: 138 mmol/L (ref 135–145)
Total Bilirubin: 1 mg/dL (ref 0.3–1.2)
Total Protein: 6.4 g/dL (ref 6.0–8.3)

## 2014-08-05 LAB — GLUCOSE, CAPILLARY
GLUCOSE-CAPILLARY: 82 mg/dL (ref 70–99)
Glucose-Capillary: 92 mg/dL (ref 70–99)
Glucose-Capillary: 72 mg/dL (ref 70–99)
Glucose-Capillary: 67 mg/dL — ABNORMAL LOW (ref 70–99)
Glucose-Capillary: 88 mg/dL (ref 70–99)
Glucose-Capillary: 77 mg/dL (ref 70–99)

## 2014-08-05 LAB — PROTIME-INR
INR: 1.01 (ref 0.00–1.49)
PROTHROMBIN TIME: 13.4 s (ref 11.6–15.2)

## 2014-08-05 MED ORDER — HYDROXYZINE HCL 25 MG PO TABS
25.0000 mg | ORAL_TABLET | Freq: Four times a day (QID) | ORAL | Status: DC | PRN
  Filled 2014-08-05: qty 1

## 2014-08-05 MED ORDER — METOPROLOL TARTRATE 50 MG PO TABS
50.0000 mg | ORAL_TABLET | Freq: Two times a day (BID) | ORAL | Status: DC
  Administered 2014-08-05 – 2014-08-06 (×2): 50 mg via ORAL
  Filled 2014-08-05 (×3): qty 1

## 2014-08-05 NOTE — Progress Notes (Signed)
[place] TEAM 1 - Stepdown/ICU TEAM Progress Note  [name] FVC:[id number] DOB: 1980-12-07 DOA: 08/02/2014 PCP: Elbert Ewings, FNP  Admit HPI / Brief Narrative: 34 y.o. M Hx schizophrenia and bipolar disorder, seizures, HTN, depression, pancreatitis, liver cirrhosis, CAD, hypercholesteremia, cancer of left kidney, and polysubstance abuse who presented with overdose of acetaminophen in an apparent suicide attempt.  Patient stated that at about 8:00 to 9:00 PM he took 16 pills of tylenol (500 mg/tab) w/ the intent to kill himself. His mother stated to ED that patient took 18 pills of tylenol and 25 mg diphenhydramine. He also reported that he drank two bottles of wine. He vomited twice.  At the time of admission he stated he is going to kill himself no matter what, and there is nothing anyone can do to stop it.   In ED acetaminophen level was 181 at approximately 4 hours post ingestion. AST 73, ALT 77, lactate 1.89. Lipase 30, alcohol level 322.   HPI/Subjective: Pt is alert and conversant.  He says he feels "a little better today."  He is calm and appropriate, and asks when he will be d/c home.    Assessment/Plan:  Suicide attempt / ongoing suicidal ideation  -Psychiatry has seen and determined patient requires inpatient psychiatric care when medically stable - cont sitter - CSW to begin searching for bed   Psychotic (hallucinations/delusions) -cont Risperdal but at lower dose per Psych rec - restraints for safety   Multiple psychiatric issues - Schizophrenia, bipolar, depression - continue Cymbalta + Remeron - hold Abilify due to prolonged QTc - sitter at bedside  Acetaminophen toxicity / intentional APAP OD  -Initial acetaminophen level was 181; last acetaminophen level <10  -Liver enzymes have essentially normalized   -INR within normal limit   -now off  IV Acetylcysteine  -Avoid liver toxic medication  -avoid Zofran due to liver metabolization and QTc=502 -Hydroxyzine for  nausea -Acute hepatitis panel negative  -HIV negative   Hypertension -BP currently well controlled   Polysubstance abuse  -UDS negative -CIWA protocol -Nicotine patch  Alcohol abuse  -On admission alcohol level 322 - no evidence of active withdrawal at this time   Code Status: FULL Family Communication: no family present at time of exam Disposition Plan: Discharge to inpatient psychiatric facility when medically stable - transfer tele bed today   Consultants: Psychiatry  Procedure/Significant Events: none  Antibiotics: None  DVT prophylaxis: Lovenox  Objective: Blood pressure 120/80, pulse 87, temperature 98 F (36.7 C), temperature source Oral, resp. rate 14, height 5\' 6"  (1.676 m), weight 74.844 kg (165 lb), SpO2 99 %.  Intake/Output Summary (Last 24 hours) at 08/05/14 1414 Last data filed at 08/05/14 0900  Gross per 24 hour  Intake   1905 ml  Output   2500 ml  Net   -595 ml   Exam: General: alert and conversant - no acute respiratory distress Lungs: Clear to auscultation bilaterally without wheezes or crackles Cardiovascular: Regular rate and rhythm without murmur gallop or rub normal S1 and S2 Abdomen: Nontender, nondistended, soft, bowel sounds positive, no rebound, no ascites, no appreciable mass Extremities: No significant cyanosis, clubbing, or edema bilateral lower extremities  Data Reviewed: Basic Metabolic Panel:  Recent Labs Lab 08/02/14 2345 08/03/14 0024 08/03/14 0821 08/04/14 0305 08/04/14 1638 08/05/14 0251  NA 141  --  141 136 138 138  K 4.0  --  3.7 3.7 4.1 4.0  CL 105  --  106 105 109 109  CO2 25  --  23 25 22 24   GLUCOSE 77  --  73 85 89 87  BUN 9  --  7 5* <5* <5*  CREATININE 0.94  --  0.80 0.80 0.82 0.85  CALCIUM 9.1  --  8.3* 9.0 9.1 9.0  MG  --  1.9  --   --   --   --    Liver Function Tests:  Recent Labs Lab 08/02/14 2345 08/03/14 0821 08/04/14 0305 08/04/14 1638 08/05/14 0251  AST 73* 58* 46* 44* 42*  ALT 77*  64* 57* 50 46  ALKPHOS 47 34* 40 44 42  BILITOT 0.5 0.4 1.0 1.2 1.0  PROT 7.3 6.0 6.6 6.7 6.4  ALBUMIN 4.1 3.2* 3.5 3.6 3.4*    Recent Labs Lab 08/02/14 2345  LIPASE 30    Recent Labs Lab 08/04/14 1638  AMMONIA 30   CBC:  Recent Labs Lab 08/03/14 0024 08/03/14 0821 08/04/14 0305  WBC 7.4 3.5* 4.8  NEUTROABS 1.4*  --   --   HGB 15.4 14.2 15.9  HCT 43.5 40.9 43.7  MCV 92.8 91.7 91.0  PLT 243 226 251   CBG:  Recent Labs Lab 08/04/14 2327 08/05/14 0349 08/05/14 0820 08/05/14 1139 08/05/14 1201  GLUCAP 82 92 72 67* 88    Recent Results (from the past 240 hour(s))  MRSA PCR Screening     Status: None   Collection Time: 08/03/14  5:23 PM  Result Value Ref Range Status   MRSA by PCR NEGATIVE NEGATIVE Final    Comment:        The GeneXpert MRSA Assay (FDA approved for NASAL specimens only), is one component of a comprehensive MRSA colonization surveillance program. It is not intended to diagnose MRSA infection nor to guide or monitor treatment for MRSA infections.      Studies:  Recent x-ray studies have been reviewed in detail by the Attending Physician  Scheduled Meds:  Scheduled Meds: . amLODipine  5 mg Oral Daily  . DULoxetine  30 mg Oral Daily  . enoxaparin (LOVENOX) injection  40 mg Subcutaneous Daily  . folic acid  1 mg Oral Daily  . gabapentin  200 mg Oral TID  . LORazepam  0-4 mg Intravenous Q12H  . metoprolol  75 mg Oral BID  . mirtazapine  15 mg Oral QHS  . multivitamin with minerals  1 tablet Oral Daily  . nicotine  14 mg Transdermal Daily  . pantoprazole  40 mg Oral BID  . risperiDONE  1 mg Oral Daily  . sodium chloride  3 mL Intravenous Q12H  . thiamine  100 mg Oral Daily   Or  . thiamine  100 mg Intravenous Daily    Time spent on care of this patient: 35 mins  Cherene Altes, MD [facility] For Consults/Admissions - Flow Manager - [phone] Office  [phone]  Contact MD directly via text page:       [email or web address]      password [facility]  08/05/2014, 2:14 PM   LOS: 3 days

## 2014-08-05 NOTE — Clinical Social Work Note (Signed)
CSW Consult Acknowledged:   CSW received a consult for SI attempted. CSW left the Psych CSW a voice message regarding the pt.       [place], MSW, [place]

## 2014-08-05 NOTE — Progress Notes (Signed)
Report called, pt transferring to [phone] via w/c with sitter and belongings.

## 2014-08-06 DIAGNOSIS — T391X1A Poisoning by 4-Aminophenol derivatives, accidental (unintentional), initial encounter: Secondary | ICD-10-CM

## 2014-08-06 DIAGNOSIS — F3130 Bipolar disorder, current episode depressed, mild or moderate severity, unspecified: Secondary | ICD-10-CM

## 2014-08-06 LAB — CBC
HEMATOCRIT: 42.5 % (ref 39.0–52.0)
Hemoglobin: 14.7 g/dL (ref 13.0–17.0)
MCH: 32.2 pg (ref 26.0–34.0)
MCHC: 34.6 g/dL (ref 30.0–36.0)
MCV: 93.2 fL (ref 78.0–100.0)
Platelets: 187 10*3/uL (ref 150–400)
RBC: 4.56 MIL/uL (ref 4.22–5.81)
RDW: 13.2 % (ref 11.5–15.5)
WBC: 4.2 10*3/uL (ref 4.0–10.5)

## 2014-08-06 LAB — COMPREHENSIVE METABOLIC PANEL
ALT: 39 U/L (ref 0–53)
AST: 35 U/L (ref 0–37)
Albumin: 3.3 g/dL — ABNORMAL LOW (ref 3.5–5.2)
Alkaline Phosphatase: 45 U/L (ref 39–117)
Anion gap: 9 (ref 5–15)
BILIRUBIN TOTAL: 0.7 mg/dL (ref 0.3–1.2)
BUN: 6 mg/dL (ref 6–23)
CALCIUM: 9 mg/dL (ref 8.4–10.5)
CO2: 25 mmol/L (ref 19–32)
CREATININE: 0.79 mg/dL (ref 0.50–1.35)
Chloride: 104 mEq/L (ref 96–112)
GFR calc non Af Amer: >90 mL/min (ref >90)
Glucose, Bld: 86 mg/dL (ref 70–99)
POTASSIUM: 3.8 mmol/L (ref 3.5–5.1)
Sodium: 138 mmol/L (ref 135–145)
TOTAL PROTEIN: 6.2 g/dL (ref 6.0–8.3)

## 2014-08-06 LAB — PHOSPHORUS: Phosphorus: 4 mg/dL (ref 2.3–4.6)

## 2014-08-06 LAB — MAGNESIUM: MAGNESIUM: 1.8 mg/dL (ref 1.5–2.5)

## 2014-08-06 MED ORDER — MIRTAZAPINE 15 MG PO TBDP: 15.0000 mg | ORAL_TABLET | Freq: Every day | ORAL | Status: DC

## 2014-08-06 MED ORDER — NICOTINE 14 MG/24HR TD PT24: 14.0000 mg | MEDICATED_PATCH | Freq: Every day | TRANSDERMAL | Status: DC

## 2014-08-06 MED ORDER — AMLODIPINE BESYLATE 5 MG PO TABS: 5.0000 mg | ORAL_TABLET | Freq: Every day | ORAL | Status: DC

## 2014-08-06 MED ORDER — RISPERIDONE 1 MG PO TABS: 1.0000 mg | ORAL_TABLET | Freq: Every day | ORAL | Status: DC

## 2014-08-06 MED ORDER — DULOXETINE HCL 30 MG PO CPEP: 30.0000 mg | ORAL_CAPSULE | Freq: Every day | ORAL | Status: DC

## 2014-08-06 MED ORDER — GABAPENTIN 100 MG PO CAPS: 200.0000 mg | ORAL_CAPSULE | Freq: Three times a day (TID) | ORAL | Status: DC

## 2014-08-06 MED ORDER — ARIPIPRAZOLE 10 MG PO TABS: 10.0000 mg | ORAL_TABLET | Freq: Every day | ORAL | Status: DC

## 2014-08-06 MED ORDER — HYDROXYZINE HCL 25 MG PO TABS: 25.0000 mg | ORAL_TABLET | Freq: Four times a day (QID) | ORAL | Status: DC | PRN

## 2014-08-06 NOTE — Consult Note (Signed)
[facility] Face-to-Face Psychiatry Consult   Reason for Consult:  Depression and status post suicide attempt by OD of acetaminophen while intoxicated Referring Physician:  Dr. Elliot Gault Rallis is an 34 y.o. male. Total Time spent with patient: 30 minutes  Assessment: AXIS I:  Bipolar, Depressed and  substance abuse  AXIS II:  Cluster B Traits AXIS III:   Past Medical History  Diagnosis Date  . Seizures   . Hypertension   . Depression   . Pancreatitis   . Liver cirrhosis   . Coronary artery disease   . Angina   . Shortness of breath   . Headache(784.0)   . Peripheral vascular disease April 2011    Left Pop  . Hypercholesteremia   . Schizophrenia   . Bipolar 1 disorder   . Cancer of kidney dx'd 04/2013    lt nephrectomy  . Breast CA dx'd 2009    bil w/ bil masectomy and oral meds  . H/O suicide attempt 2015    overdose   AXIS IV:  economic problems and problems with primary support group AXIS V:  1-10 persistent dangerousness to self and others present  Plan:  Case discussed with Dr. Sloan Leiter and staff RN and patient mother No evidence of imminent risk to self or others at present.   Patient does not meet criteria for psychiatric inpatient admission. Supportive therapy provided about ongoing stressors. Discussed crisis plan, support from social network, calling 911, coming to the Emergency Department, and calling Suicide Hotline. Refer to [facility] center for psychiatry and substance abuse.  Subjective:   [name] is a 34 y.o. male patient admitted with suicide attempt on acetaminophen.  HPI: Patient is a 34 year old black male with a history of schizophrenia bipolar disorder seizures pancreatitis liver cirrhosis and cancer of left kidney and polysubstance abuse. Apparently he took an overdose of acetaminophen. His mother had told the ED that patient took 18 pills of Tylenol and 25 mg diphenhydramine. He also drank 2 bottles of wine. He had told his hospitalist  here that he was going to kill himself no matter what and nothing was going to stop it. Apparently he was very agitated prior to my arrival and had received Resporal 3 mg. When I went in to see him he was very difficult to arouse and was mumbling in a garbled fashion and difficult to assess.  Interval History: Patient stated that he is feeling much better due to receiving appropriate treatment. He denied current symptoms of depression, mania, psychosis and suicide or homicide ideations, intention or plans. He has multiple psych admission for similar conditions. Patient stated that he has court date on 1/21 and 1/28 and needs to attend job interview on Monday. Patient mother endorses the information and says will provide support he needs to attend his out patient care and currently staying in his apartment. He also has a 54 years old cousin available to support him. Patient contract for safety and willing to participate in out patient care. He does not meet criteria of acute psych admission at this time.   Past Psychiatric History: Past Medical History  Diagnosis Date  . Seizures   . Hypertension   . Depression   . Pancreatitis   . Liver cirrhosis   . Coronary artery disease   . Angina   . Shortness of breath   . Headache(784.0)   . Peripheral vascular disease April 2011    Left Pop  . Hypercholesteremia   . Schizophrenia   .  Bipolar 1 disorder   . Cancer of kidney dx'd 04/2013    lt nephrectomy  . Breast CA dx'd 2009    bil w/ bil masectomy and oral meds  . H/O suicide attempt 2015    overdose    reports that he has been smoking Cigarettes.  He has a .4 pack-year smoking history. He has never used smokeless tobacco. He reports that he drinks alcohol. His drug history is not on file. Family History  Problem Relation Age of Onset  . Stroke Other      Living Arrangements: Other relatives   Abuse/Neglect [facility]) Physical Abuse: Denies Verbal Abuse: Denies Sexual Abuse:  Denies Allergies:   Allergies  Allergen Reactions  . Codeine Hives, Itching and Swelling  . Depakote [Divalproex Sodium] Other (See Comments)    "Bug out and hallucinate"  . Morphine Itching  . Penicillins Swelling  . Hydrocodone-Acetaminophen Itching and Rash     Objective: Blood pressure 129/71, pulse 82, temperature 98.6 F (37 C), temperature source Oral, resp. rate 16, height '5\' 6"'  (1.676 m), weight 74.844 kg (165 lb), SpO2 98 %.Body mass index is 26.64 kg/(m^2). Results for orders placed or performed during the hospital encounter of 08/02/14 (from the past 72 hour(s))  CBG monitoring, ED     Status: Abnormal   Collection Time: 08/03/14  4:17 PM  Result Value Ref Range   Glucose-Capillary 118 (H) 70 - 99 mg/dL   Comment 1 Notify RN   MRSA PCR Screening     Status: None   Collection Time: 08/03/14  5:23 PM  Result Value Ref Range   MRSA by PCR NEGATIVE NEGATIVE    Comment:        The GeneXpert MRSA Assay (FDA approved for NASAL specimens only), is one component of a comprehensive MRSA colonization surveillance program. It is not intended to diagnose MRSA infection nor to guide or monitor treatment for MRSA infections.   Glucose, capillary     Status: Abnormal   Collection Time: 08/03/14  7:19 PM  Result Value Ref Range   Glucose-Capillary 107 (H) 70 - 99 mg/dL   Comment 1 Notify RN   Glucose, capillary     Status: None   Collection Time: 08/03/14 11:53 PM  Result Value Ref Range   Glucose-Capillary 82 70 - 99 mg/dL   Comment 1 Notify RN   Comprehensive metabolic panel     Status: Abnormal   Collection Time: 08/04/14  3:05 AM  Result Value Ref Range   Sodium 136 135 - 145 mmol/L    Comment: Please note change in reference range.   Potassium 3.7 3.5 - 5.1 mmol/L    Comment: Please note change in reference range.   Chloride 105 96 - 112 mEq/L   CO2 25 19 - 32 mmol/L   Glucose, Bld 85 70 - 99 mg/dL   BUN 5 (L) 6 - 23 mg/dL   Creatinine, Ser 0.80 0.50 - 1.35  mg/dL   Calcium 9.0 8.4 - 10.5 mg/dL   Total Protein 6.6 6.0 - 8.3 g/dL   Albumin 3.5 3.5 - 5.2 g/dL   AST 46 (H) 0 - 37 U/L   ALT 57 (H) 0 - 53 U/L   Alkaline Phosphatase 40 39 - 117 U/L   Total Bilirubin 1.0 0.3 - 1.2 mg/dL   GFR calc non Af Amer >90 >90 mL/min   GFR calc Af Amer >90 >90 mL/min    Comment: (NOTE) The eGFR has been calculated using the  CKD EPI equation. This calculation has not been validated in all clinical situations. eGFR's persistently <90 mL/min signify possible Chronic Kidney Disease.    Anion gap 6 5 - 15  Protime-INR     Status: None   Collection Time: 08/04/14  3:05 AM  Result Value Ref Range   Prothrombin Time 14.0 11.6 - 15.2 seconds   INR 1.06 0.00 - 1.49  CBC     Status: Abnormal   Collection Time: 08/04/14  3:05 AM  Result Value Ref Range   WBC 4.8 4.0 - 10.5 K/uL   RBC 4.80 4.22 - 5.81 MIL/uL   Hemoglobin 15.9 13.0 - 17.0 g/dL   HCT 43.7 39.0 - 52.0 %   MCV 91.0 78.0 - 100.0 fL   MCH 33.1 26.0 - 34.0 pg   MCHC 36.4 (H) 30.0 - 36.0 g/dL   RDW 13.0 11.5 - 15.5 %   Platelets 251 150 - 400 K/uL  Acetaminophen level     Status: Abnormal   Collection Time: 08/04/14  3:05 AM  Result Value Ref Range   Acetaminophen (Tylenol), Serum <10.0 (L) 10 - 30 ug/mL    Comment:        THERAPEUTIC CONCENTRATIONS VARY SIGNIFICANTLY. A RANGE OF 10-30 ug/mL MAY BE AN EFFECTIVE CONCENTRATION FOR MANY PATIENTS. HOWEVER, SOME ARE BEST TREATED AT CONCENTRATIONS OUTSIDE THIS RANGE. ACETAMINOPHEN CONCENTRATIONS >150 ug/mL AT 4 HOURS AFTER INGESTION AND >50 ug/mL AT 12 HOURS AFTER INGESTION ARE OFTEN ASSOCIATED WITH TOXIC REACTIONS.   Glucose, capillary     Status: None   Collection Time: 08/04/14  3:06 AM  Result Value Ref Range   Glucose-Capillary 91 70 - 99 mg/dL   Comment 1 Notify RN   Glucose, capillary     Status: None   Collection Time: 08/04/14  7:59 AM  Result Value Ref Range   Glucose-Capillary 84 70 - 99 mg/dL   Comment 1 Documented in Chart     Comment 2 Notify RN   Hepatitis panel, acute     Status: None   Collection Time: 08/04/14 11:23 AM  Result Value Ref Range   Hepatitis B Surface Ag NEGATIVE NEGATIVE   HCV Ab NEGATIVE NEGATIVE   Hep A IgM NON REACTIVE NON REACTIVE    Comment: (NOTE) Effective June 03, 2014, Hepatitis Acute Panel (test code [phone]) will be revised to automatically reflex to the Hepatitis C Viral RNA, Quantitative, Real-Time PCR assay if the Hepatitis C antibody screening result is Reactive. This action is being taken to ensure that the CDC/USPSTF recommended HCV diagnostic algorithm with the appropriate test reflex needed for accurate interpretation is followed.    Hep B C IgM NON REACTIVE NON REACTIVE    Comment: (NOTE) High levels of Hepatitis B Core IgM antibody are detectable during the acute stage of Hepatitis B. This antibody is used to differentiate current from past HBV infection. Performed at [place] screen [facility])     Status: None   Collection Time: 08/04/14 11:23 AM  Result Value Ref Range   SUDS Rapid HIV Screen NON REACTIVE NON REACTIVE  Glucose, capillary     Status: None   Collection Time: 08/04/14 12:14 PM  Result Value Ref Range   Glucose-Capillary 92 70 - 99 mg/dL   Comment 1 Documented in Chart    Comment 2 Notify RN   Comprehensive metabolic panel     Status: Abnormal   Collection Time: 08/04/14  4:38 PM  Result Value Ref Range  Sodium 138 135 - 145 mmol/L    Comment: Please note change in reference range.   Potassium 4.1 3.5 - 5.1 mmol/L    Comment: Please note change in reference range.   Chloride 109 96 - 112 mEq/L   CO2 22 19 - 32 mmol/L   Glucose, Bld 89 70 - 99 mg/dL   BUN <5 (L) 6 - 23 mg/dL   Creatinine, Ser 0.82 0.50 - 1.35 mg/dL   Calcium 9.1 8.4 - 10.5 mg/dL   Total Protein 6.7 6.0 - 8.3 g/dL   Albumin 3.6 3.5 - 5.2 g/dL   AST 44 (H) 0 - 37 U/L   ALT 50 0 - 53 U/L   Alkaline Phosphatase 44 39 - 117 U/L   Total Bilirubin  1.2 0.3 - 1.2 mg/dL   GFR calc non Af Amer >90 >90 mL/min   GFR calc Af Amer >90 >90 mL/min    Comment: (NOTE) The eGFR has been calculated using the CKD EPI equation. This calculation has not been validated in all clinical situations. eGFR's persistently <90 mL/min signify possible Chronic Kidney Disease.    Anion gap 7 5 - 15  Ammonia     Status: None   Collection Time: 08/04/14  4:38 PM  Result Value Ref Range   Ammonia 30 11 - 32 umol/L    Comment: Please note change in reference range.  Glucose, capillary     Status: Abnormal   Collection Time: 08/04/14  5:18 PM  Result Value Ref Range   Glucose-Capillary 62 (L) 70 - 99 mg/dL  Glucose, capillary     Status: None   Collection Time: 08/04/14  5:50 PM  Result Value Ref Range   Glucose-Capillary 91 70 - 99 mg/dL  Glucose, capillary     Status: None   Collection Time: 08/04/14  7:29 PM  Result Value Ref Range   Glucose-Capillary 77 70 - 99 mg/dL  Glucose, capillary     Status: None   Collection Time: 08/04/14 11:27 PM  Result Value Ref Range   Glucose-Capillary 82 70 - 99 mg/dL  Comprehensive metabolic panel     Status: Abnormal   Collection Time: 08/05/14  2:51 AM  Result Value Ref Range   Sodium 138 135 - 145 mmol/L    Comment: Please note change in reference range.   Potassium 4.0 3.5 - 5.1 mmol/L    Comment: Please note change in reference range.   Chloride 109 96 - 112 mEq/L   CO2 24 19 - 32 mmol/L   Glucose, Bld 87 70 - 99 mg/dL   BUN <5 (L) 6 - 23 mg/dL    Comment: REPEATED TO VERIFY   Creatinine, Ser 0.85 0.50 - 1.35 mg/dL   Calcium 9.0 8.4 - 10.5 mg/dL   Total Protein 6.4 6.0 - 8.3 g/dL   Albumin 3.4 (L) 3.5 - 5.2 g/dL   AST 42 (H) 0 - 37 U/L   ALT 46 0 - 53 U/L   Alkaline Phosphatase 42 39 - 117 U/L   Total Bilirubin 1.0 0.3 - 1.2 mg/dL   GFR calc non Af Amer >90 >90 mL/min   GFR calc Af Amer >90 >90 mL/min    Comment: (NOTE) The eGFR has been calculated using the CKD EPI equation. This calculation has  not been validated in all clinical situations. eGFR's persistently <90 mL/min signify possible Chronic Kidney Disease.    Anion gap 5 5 - 15  Protime-INR     Status: None  Collection Time: 08/05/14  2:51 AM  Result Value Ref Range   Prothrombin Time 13.4 11.6 - 15.2 seconds   INR 1.01 0.00 - 1.49  Glucose, capillary     Status: None   Collection Time: 08/05/14  3:49 AM  Result Value Ref Range   Glucose-Capillary 92 70 - 99 mg/dL  Glucose, capillary     Status: None   Collection Time: 08/05/14  8:20 AM  Result Value Ref Range   Glucose-Capillary 72 70 - 99 mg/dL   Comment 1 Documented in Chart    Comment 2 Notify RN   Glucose, capillary     Status: Abnormal   Collection Time: 08/05/14 11:39 AM  Result Value Ref Range   Glucose-Capillary 67 (L) 70 - 99 mg/dL   Comment 1 Documented in Chart    Comment 2 Notify RN   Glucose, capillary     Status: None   Collection Time: 08/05/14 12:01 PM  Result Value Ref Range   Glucose-Capillary 88 70 - 99 mg/dL   Comment 1 Documented in Chart    Comment 2 Notify RN   Glucose, capillary     Status: None   Collection Time: 08/05/14  4:00 PM  Result Value Ref Range   Glucose-Capillary 77 70 - 99 mg/dL   Comment 1 Documented in Chart    Comment 2 Notify RN   Comprehensive metabolic panel     Status: Abnormal   Collection Time: 08/06/14  5:07 AM  Result Value Ref Range   Sodium 138 135 - 145 mmol/L    Comment: Please note change in reference range.   Potassium 3.8 3.5 - 5.1 mmol/L    Comment: Please note change in reference range.   Chloride 104 96 - 112 mEq/L   CO2 25 19 - 32 mmol/L   Glucose, Bld 86 70 - 99 mg/dL   BUN 6 6 - 23 mg/dL   Creatinine, Ser 0.79 0.50 - 1.35 mg/dL   Calcium 9.0 8.4 - 10.5 mg/dL   Total Protein 6.2 6.0 - 8.3 g/dL   Albumin 3.3 (L) 3.5 - 5.2 g/dL   AST 35 0 - 37 U/L   ALT 39 0 - 53 U/L   Alkaline Phosphatase 45 39 - 117 U/L   Total Bilirubin 0.7 0.3 - 1.2 mg/dL   GFR calc non Af Amer >90 >90 mL/min   GFR  calc Af Amer >90 >90 mL/min    Comment: (NOTE) The eGFR has been calculated using the CKD EPI equation. This calculation has not been validated in all clinical situations. eGFR's persistently <90 mL/min signify possible Chronic Kidney Disease.    Anion gap 9 5 - 15  Phosphorus     Status: None   Collection Time: 08/06/14  5:07 AM  Result Value Ref Range   Phosphorus 4.0 2.3 - 4.6 mg/dL  Magnesium     Status: None   Collection Time: 08/06/14  5:07 AM  Result Value Ref Range   Magnesium 1.8 1.5 - 2.5 mg/dL  CBC     Status: None   Collection Time: 08/06/14  5:07 AM  Result Value Ref Range   WBC 4.2 4.0 - 10.5 K/uL   RBC 4.56 4.22 - 5.81 MIL/uL   Hemoglobin 14.7 13.0 - 17.0 g/dL   HCT 42.5 39.0 - 52.0 %   MCV 93.2 78.0 - 100.0 fL   MCH 32.2 26.0 - 34.0 pg   MCHC 34.6 30.0 - 36.0 g/dL   RDW 13.2 11.5 -  15.5 %   Platelets 187 150 - 400 K/uL   Labs are reviewed and are pertinent for blood alcohol level 322  Current Facility-Administered Medications  Medication Dose Route Frequency Provider Last Rate Last Dose  . 0.9 %  sodium chloride infusion   Intravenous Continuous Cherene Altes, MD 50 mL/hr at 08/05/14 1445    . amLODipine (NORVASC) tablet 5 mg  5 mg Oral Daily Ivor Costa, MD   5 mg at 08/06/14 1011  . DULoxetine (CYMBALTA) DR capsule 30 mg  30 mg Oral Daily Ivor Costa, MD   30 mg at 08/06/14 1011  . enoxaparin (LOVENOX) injection 40 mg  40 mg Subcutaneous Daily Thuy Dien Dang, RPH   40 mg at 37/10/62 6948  . folic acid (FOLVITE) tablet 1 mg  1 mg Oral Daily Ivor Costa, MD   1 mg at 08/06/14 1011  . gabapentin (NEURONTIN) capsule 200 mg  200 mg Oral TID Ivor Costa, MD   200 mg at 08/06/14 1011  . hydrOXYzine (ATARAX/VISTARIL) tablet 25 mg  25 mg Oral Q6H PRN Cherene Altes, MD      . LORazepam (ATIVAN) injection 0-4 mg  0-4 mg Intravenous Q12H Ivor Costa, MD   0 mg at 08/05/14 0800  . metoprolol (LOPRESSOR) tablet 50 mg  50 mg Oral BID Cherene Altes, MD   50 mg at 08/06/14  1011  . mirtazapine (REMERON SOL-TAB) disintegrating tablet 15 mg  15 mg Oral QHS Ivor Costa, MD   15 mg at 08/05/14 2230  . multivitamin with minerals tablet 1 tablet  1 tablet Oral Daily Ivor Costa, MD   1 tablet at 08/06/14 1010  . nicotine (NICODERM CQ - dosed in mg/24 hours) patch 14 mg  14 mg Transdermal Daily Ivor Costa, MD   14 mg at 08/03/14 1730  . risperiDONE (RISPERDAL) tablet 1 mg  1 mg Oral Daily Allie Bossier, MD   1 mg at 08/06/14 1010  . sodium chloride 0.9 % injection 3 mL  3 mL Intravenous Q12H Ivor Costa, MD   3 mL at 08/06/14 1012  . SUMAtriptan (IMITREX) tablet 25 mg  25 mg Oral Q2H PRN Ivor Costa, MD      . thiamine (VITAMIN B-1) tablet 100 mg  100 mg Oral Daily Ivor Costa, MD   100 mg at 08/06/14 1010    Psychiatric Specialty Exam:     Blood pressure 129/71, pulse 82, temperature 98.6 F (37 C), temperature source Oral, resp. rate 16, height '5\' 6"'  (1.676 m), weight 74.844 kg (165 lb), SpO2 98 %.Body mass index is 26.64 kg/(m^2).  General Appearance: Casual  Eye Contact::  Good  Speech:  Clear and Coherent  Volume:  Decreased  Mood:  Depressed  Affect:  Appropriate and Congruent  Thought Process:  Coherent, Goal Directed and Unable to assess  Orientation:  Full (Time, Place, and Person)  Thought Content:  WDL  Suicidal Thoughts:  No  Homicidal Thoughts:  No  Memory:  Immediate;   Good Recent;   Good  Judgement:  Fair  Insight:  Good  Psychomotor Activity:  Normal  Concentration:  Good  Recall:  Good  Fund of Knowledge:Good  Language: Good  Akathisia:  Negative  Handed:  Right  AIMS (if indicated):     Assets:  Communication Skills Desire for Improvement Financial Resources/Insurance Housing Intimacy Leisure Time [place] Talents/Skills Transportation  Sleep:      Musculoskeletal: Strength & Muscle Tone: within  normal limits Gait & Station: normal Patient leans: N/A  Treatment Plan Summary: Daily contact with  patient to assess and evaluate symptoms and progress in treatment Medication management Continue current medication management and refer to out patient medication management at [facility] center   [facility] R. 08/06/2014 12:24 PM

## 2014-08-06 NOTE — Progress Notes (Signed)
Mr. [name] is physically medically stable for discharge.  He states he needs to go home rather than to inpatient psych because of a court date tomorrow.   Disposition per Psychiatry. Thanks,  Imogene Burn, PA-C [facility] Pager: [phone]

## 2014-08-06 NOTE — Discharge Instructions (Signed)
Please follow up at [facility] for Counseling and Medications.  Best Wishes.

## 2014-08-06 NOTE — Discharge Summary (Signed)
Physician Discharge Summary  [name] JJK:[id number] DOB: 10/22/1980 DOA: 08/02/2014  PCP: Elbert Ewings, FNP  Admit date: 08/02/2014 Discharge date: 08/06/2014  Time spent: 30 minutes  Recommendations for Outpatient Follow-up:  1. Follow up at [facility] 2. Patient was give a 14 day supply of medications at the direction of inpatient Psychiatry. 3. Abilify discontinued due to QTC prolongation. 4. CMET at hospital follow up to check LFTs.  Discharge Diagnoses:  Principal Problem:   Overdose of acetaminophen Active Problems:   Essential hypertension   Peripheral vascular disease   Major depressive disorder, recurrent episode   PTSD (post-traumatic stress disorder)   S/p nephrectomy   Alcohol abuse   Cocaine abuse   Tylenol overdose   Polysubstance abuse   Discharge Condition: stable.  History of present illness:  34 y.o. M with a Hx schizophrenia and bipolar disorder, seizures, HTN, depression, pancreatitis, liver cirrhosis, CAD, hypercholesteremia, cancer of left kidney, and polysubstance abuse,  who presented with overdose of acetaminophen in an apparent suicide attempt. Patient stated that at about 8:00 to 9:00 PM he took 16 pills of tylenol (500 mg/tab) w/ the intent to kill himself. His mother stated to ED that patient took 18 pills of tylenol and 25 mg diphenhydramine. He also reported that he drank two bottles of wine. He vomited twice. At the time of admission he stated he is going to kill himself no matter what, and there is nothing anyone can do to stop it.   In ED acetaminophen level was 181 at approximately 4 hours post ingestion.  Alcohol level 322.  Hospital Course:   Suicide attempt / ongoing suicidal ideation  -Initially Psychiatry saw and determined patient required inpatient psychiatric care.  Patient was monitored continually with a bedside sitter.  On the day of discharge the patient requested discharge to home in order to attend a  court date and a job interview.  He was reassess by Psychiatry who recommended discharge to home with the support of his mother.  Psychiatry recommended resuming his previous psychotropic medications.  The patient give prescriptions for a 14 days supply.   Psychotic (hallucinations/delusions) Patient required restraints and Risperdal due to hallucinations.  As he stabilized and the restraints were removed.  Sitter remained at bedside thru out the hospitalization.  Multiple psychiatric issues - Schizophrenia, bipolar, depression - continue Cymbalta + Remeron - D/C Abilify due to prolonged QTc - sitter at bedside during his hospital stay, reassessed by psych on 1/19-no need for inpatient psychiatric admission, okay to discharge by psychiatry.  Acetaminophen toxicity / intentional APAP OD  -Initial acetaminophen level was 181; last acetaminophen level <10  -Liver enzymes have essentially normalized  -INR was within normal limit  -now off IV Acetylcysteine  -Avoid liver toxic medication  -avoid Zofran due to liver metabolization and QTc=502 -Hydroxyzine for nausea -Acute hepatitis panel negative  -HIV negative   Hypertension -BP currently well controlled   Polysubstance abuse  -UDS negative -Nicotine patch prescribed on discharge.  Alcohol abuse  -On admission alcohol level 322 - CIWA protocol was used.  Patient is now A&O, Coherent.  He is past all possible acute withdraw symptoms.  Consultations:  Psychiatry  Discharge Exam: Filed Vitals:   08/05/14 1628 08/05/14 1859 08/05/14 2235 08/06/14 0547  BP: 109/66 109/65 119/84 129/71  Pulse: 88 93 84 82  Temp: 98.4 F (36.9 C) 98.3 F (36.8 C) 98.5 F (36.9 C) 98.6 F (37 C)  TempSrc: Oral Oral Oral Oral  Resp: 16 20  20 16  Height:      Weight:      SpO2: 99% 96% 98% 98%   Filed Weights   08/02/14 2314 08/03/14 0033  Weight: 74.844 kg (165 lb) 74.844 kg (165 lb)   General: Wd, Wn, AA male, sitting on the bed.   NAD.  Pleasant. Lungs: Clear to auscultation bilaterally without wheezes or crackles Cardiovascular: Regular rate and rhythm without murmur gallop or rub normal S1 and S2 Abdomen: Nontender, nondistended, soft, bowel sounds positive, no rebound, no ascites, no appreciable mass Extremities: No significant cyanosis, clubbing, or edema bilateral lower extremities   Discharge Instructions  Diet recommendation:   Discharge Instructions    Diet - low sodium heart healthy    Complete by:  As directed      Increase activity slowly    Complete by:  As directed           Current Discharge Medication List    START taking these medications   Details  hydrOXYzine (ATARAX/VISTARIL) 25 MG tablet Take 1 tablet (25 mg total) by mouth every 6 (six) hours as needed for itching, nausea or vomiting. Qty: 30 tablet, Refills: 0    nicotine (NICODERM CQ - DOSED IN MG/24 HOURS) 14 mg/24hr patch Place 1 patch (14 mg total) onto the skin daily. Qty: 28 patch, Refills: 0    risperiDONE (RISPERDAL) 1 MG tablet Take 1 tablet (1 mg total) by mouth daily. Qty: 14 tablet, Refills: 0      CONTINUE these medications which have CHANGED   Details  amLODipine (NORVASC) 5 MG tablet Take 1 tablet (5 mg total) by mouth daily. Qty: 30 tablet, Refills: 0    DULoxetine (CYMBALTA) 30 MG capsule Take 1 capsule (30 mg total) by mouth daily. Qty: 14 capsule, Refills: 0    gabapentin (NEURONTIN) 100 MG capsule Take 2 capsules (200 mg total) by mouth 3 (three) times daily. Qty: 80 capsule, Refills: 0    mirtazapine (REMERON SOL-TAB) 15 MG disintegrating tablet Take 1 tablet (15 mg total) by mouth at bedtime. Qty: 14 tablet, Refills: 0      CONTINUE these medications which have NOT CHANGED   Details  metoprolol (LOPRESSOR) 50 MG tablet Take 1 tablet (50 mg total) by mouth 2 (two) times daily. Qty: 60 tablet, Refills: 0    ondansetron (ZOFRAN) 4 MG tablet Take 1 tablet (4 mg total) by mouth every 6 (six) hours as  needed for nausea. Qty: 20 tablet, Refills: 0    SUMAtriptan (IMITREX) 25 MG tablet Take 1 tablet (25 mg total) by mouth every 2 (two) hours as needed for migraine or headache. May repeat in 2 hours if headache persists or recurs. Qty: 10 tablet, Refills: 0      STOP taking these medications     ARIPiprazole (ABILIFY) 10 MG tablet        Allergies  Allergen Reactions  . Codeine Hives, Itching and Swelling  . Depakote [Divalproex Sodium] Other (See Comments)    "Bug out and hallucinate"  . Morphine Itching  . Penicillins Swelling  . Hydrocodone-Acetaminophen Itching and Rash   Follow-up Information    Follow up with [facility] In 3 days.   Specialty:  Behavioral Health   Why:  Hospital follow up for medication management.   Contact information:   [address] [place] [address] [phone]       Follow up with STEELE,ANTHONY, FNP In 2 weeks.   Specialty:  Nurse Practitioner   Contact information:  [place] [address] [phone]        The results of significant diagnostics from this hospitalization (including imaging, microbiology, ancillary and laboratory) are listed below for reference.    Significant Diagnostic Studies: No results found.  Microbiology: Recent Results (from the past 240 hour(s))  MRSA PCR Screening     Status: None   Collection Time: 08/03/14  5:23 PM  Result Value Ref Range Status   MRSA by PCR NEGATIVE NEGATIVE Final    Comment:        The GeneXpert MRSA Assay (FDA approved for NASAL specimens only), is one component of a comprehensive MRSA colonization surveillance program. It is not intended to diagnose MRSA infection nor to guide or monitor treatment for MRSA infections.      Labs: Basic Metabolic Panel:  Recent Labs Lab 08/03/14 0024 08/03/14 0821 08/04/14 0305 08/04/14 1638 08/05/14 0251 08/06/14 0507  NA  --  141 136 138 138 138  K  --  3.7 3.7 4.1 4.0 3.8  CL  --  106 105 109 109 104   CO2  --  23 25 22 24 25   GLUCOSE  --  73 85 89 87 86  BUN  --  7 5* <5* <5* 6  CREATININE  --  0.80 0.80 0.82 0.85 0.79  CALCIUM  --  8.3* 9.0 9.1 9.0 9.0  MG 1.9  --   --   --   --  1.8  PHOS  --   --   --   --   --  4.0   Liver Function Tests:  Recent Labs Lab 08/03/14 0821 08/04/14 0305 08/04/14 1638 08/05/14 0251 08/06/14 0507  AST 58* 46* 44* 42* 35  ALT 64* 57* 50 46 39  ALKPHOS 34* 40 44 42 45  BILITOT 0.4 1.0 1.2 1.0 0.7  PROT 6.0 6.6 6.7 6.4 6.2  ALBUMIN 3.2* 3.5 3.6 3.4* 3.3*    Recent Labs Lab 08/02/14 2345  LIPASE 30    Recent Labs Lab 08/04/14 1638  AMMONIA 30   CBC:  Recent Labs Lab 08/03/14 0024 08/03/14 0821 08/04/14 0305 08/06/14 0507  WBC 7.4 3.5* 4.8 4.2  NEUTROABS 1.4*  --   --   --   HGB 15.4 14.2 15.9 14.7  HCT 43.5 40.9 43.7 42.5  MCV 92.8 91.7 91.0 93.2  PLT 243 226 251 187   CBG:  Recent Labs Lab 08/05/14 0349 08/05/14 0820 08/05/14 1139 08/05/14 1201 08/05/14 1600  GLUCAP 92 72 67* 88 77    Signed:  York, Bobby Rumpf, PA-C  [facility] 08/06/2014, 1:01 PM  AttendingPatient was seen, examined,treatment plan was discussed with the  Advance Practice Provider.  I have directly reviewed the clinical findings, lab, imaging studies and management of this patient in detail. I have made the necessary changes to the above noted documentation, and agree with the documentation, as recorded by the Advance Practice Provider.   Reassessed by psychiatry today, no need for further inpatient psychiatric admission, okay for discharge with outpatient psychiatric follow-up. Rest as above. Patient is requesting discharge today as patient has an appointment for a job and a court date tomorrow.  Nena Alexander MD [facility].

## 2014-08-12 LAB — RPR: RPR Ser Ql: NONREACTIVE

## 2014-08-15 ENCOUNTER — Encounter (HOSPITAL_COMMUNITY): Payer: Self-pay | Admitting: *Deleted

## 2014-08-15 ENCOUNTER — Emergency Department (HOSPITAL_COMMUNITY)
Admission: EM | Admit: 2014-08-15 | Discharge: 2014-08-15 | Disposition: A | Discharge status: Home / Self Care | Payer: No Typology Code available for payment source | Attending: Emergency Medicine | Admitting: Emergency Medicine

## 2014-08-15 DIAGNOSIS — Z880 Allergy status to penicillin: Secondary | ICD-10-CM | POA: Insufficient documentation

## 2014-08-15 DIAGNOSIS — F209 Schizophrenia, unspecified: Secondary | ICD-10-CM | POA: Insufficient documentation

## 2014-08-15 DIAGNOSIS — I10 Essential (primary) hypertension: Secondary | ICD-10-CM | POA: Insufficient documentation

## 2014-08-15 DIAGNOSIS — Z720 Tobacco use: Secondary | ICD-10-CM | POA: Insufficient documentation

## 2014-08-15 DIAGNOSIS — G40909 Epilepsy, unspecified, not intractable, without status epilepticus: Secondary | ICD-10-CM | POA: Insufficient documentation

## 2014-08-15 DIAGNOSIS — I2510 Atherosclerotic heart disease of native coronary artery without angina pectoris: Secondary | ICD-10-CM | POA: Insufficient documentation

## 2014-08-15 DIAGNOSIS — K047 Periapical abscess without sinus: Secondary | ICD-10-CM | POA: Insufficient documentation

## 2014-08-15 DIAGNOSIS — Z8051 Family history of malignant neoplasm of kidney: Secondary | ICD-10-CM | POA: Insufficient documentation

## 2014-08-15 DIAGNOSIS — Z853 Personal history of malignant neoplasm of breast: Secondary | ICD-10-CM | POA: Insufficient documentation

## 2014-08-15 DIAGNOSIS — Z79899 Other long term (current) drug therapy: Secondary | ICD-10-CM | POA: Insufficient documentation

## 2014-08-15 DIAGNOSIS — F319 Bipolar disorder, unspecified: Secondary | ICD-10-CM | POA: Insufficient documentation

## 2014-08-15 DIAGNOSIS — K029 Dental caries, unspecified: Secondary | ICD-10-CM | POA: Insufficient documentation

## 2014-08-15 MED ORDER — CLINDAMYCIN HCL 150 MG PO CAPS: 150.0000 mg | ORAL_CAPSULE | Freq: Four times a day (QID) | ORAL | Status: DC

## 2014-08-15 MED ORDER — IBUPROFEN 800 MG PO TABS: 800.0000 mg | ORAL_TABLET | Freq: Three times a day (TID) | ORAL | Status: DC | PRN

## 2014-08-15 MED ORDER — NAPROXEN 500 MG PO TABS
500.0000 mg | ORAL_TABLET | Freq: Once | ORAL | Status: AC
  Administered 2014-08-15: 500 mg via ORAL
  Filled 2014-08-15: qty 1

## 2014-08-15 NOTE — ED Notes (Signed)
Pt reports L lower dental pain x 1 week.  Dental decay noted.  Pt reports pending dentist appt 2/2.  Pt also presents with L facial swelling.

## 2014-08-15 NOTE — ED Provider Notes (Signed)
CSN: [id number]     Arrival date & time 08/15/14  1903 History   First MD Initiated Contact with Patient 08/15/14 1908     This chart was scribed for non-physician practitioner working with Blanchie Dessert, MD by Forrestine Him, ED Scribe. This patient was seen in room WTR6/WTR6 and the patient's care was started at 7:12 PM.   No chief complaint on file.  HPI  HPI Comments: [name] is a 34 y.o. male with a PMHx of HTN, liver cirrhosis, CAD, who presents to the Emergency Department complaining of constant, ongoing L lower dental pain with associated facial swelling x 2 weeks that has progressively worsened. Pt was scheduled to have oral surgery earlier this week but was rescheduled to 2/2 due to dentist being out. For current discomfort he has tried OTC Tylenol and Ibuprofen without any improvement for symptoms. No recent fever or chills.  Past Medical History  Diagnosis Date  . Seizures   . Hypertension   . Depression   . Pancreatitis   . Liver cirrhosis   . Coronary artery disease   . Angina   . Shortness of breath   . Headache(784.0)   . Peripheral vascular disease April 2011    Left Pop  . Hypercholesteremia   . Schizophrenia   . Bipolar 1 disorder   . Cancer of kidney dx'd 04/2013    lt nephrectomy  . Breast CA dx'd 2009    bil w/ bil masectomy and oral meds  . H/O suicide attempt 2015    overdose   Past Surgical History  Procedure Laterality Date  . Chest surgery    . Left leg surgery    . Mastectomy    . Left kidney removal    . Breast surgery     Family History  Problem Relation Age of Onset  . Stroke Other    History  Substance Use Topics  . Smoking status: Current Every Day Smoker -- 0.10 packs/day for 4 years    Types: Cigarettes  . Smokeless tobacco: Never Used  . Alcohol Use: 0.0 oz/week     Comment: 3 bottles of wine daily    Review of Systems  Constitutional: Negative for fever and chills.  HENT: Positive for dental problem and facial  swelling.       Allergies  Codeine; Depakote; Morphine; Penicillins; and Hydrocodone-acetaminophen  Home Medications   Prior to Admission medications   Medication Sig Start Date End Date Taking? Authorizing Provider  amLODipine (NORVASC) 5 MG tablet Take 1 tablet (5 mg total) by mouth daily. 08/06/14   Bobby Rumpf York, PA-C  DULoxetine (CYMBALTA) 30 MG capsule Take 1 capsule (30 mg total) by mouth daily. 08/06/14   Bobby Rumpf York, PA-C  gabapentin (NEURONTIN) 100 MG capsule Take 2 capsules (200 mg total) by mouth 3 (three) times daily. 08/06/14   Bobby Rumpf York, PA-C  hydrOXYzine (ATARAX/VISTARIL) 25 MG tablet Take 1 tablet (25 mg total) by mouth every 6 (six) hours as needed for itching, nausea or vomiting. 08/06/14   Bobby Rumpf York, PA-C  metoprolol (LOPRESSOR) 50 MG tablet Take 1 tablet (50 mg total) by mouth 2 (two) times daily. Patient not taking: Reported on 08/03/2014 04/18/14   Melton Alar, PA-C  mirtazapine (REMERON SOL-TAB) 15 MG disintegrating tablet Take 1 tablet (15 mg total) by mouth at bedtime. 08/06/14   Bobby Rumpf York, PA-C  nicotine (NICODERM CQ - DOSED IN MG/24 HOURS) 14 mg/24hr patch Place 1 patch (14 mg total)  onto the skin daily. 08/06/14   Bobby Rumpf York, PA-C  ondansetron (ZOFRAN) 4 MG tablet Take 1 tablet (4 mg total) by mouth every 6 (six) hours as needed for nausea. Patient not taking: Reported on 08/03/2014 04/18/14   Melton Alar, PA-C  risperiDONE (RISPERDAL) 1 MG tablet Take 1 tablet (1 mg total) by mouth daily. 08/06/14   Bobby Rumpf York, PA-C  SUMAtriptan (IMITREX) 25 MG tablet Take 1 tablet (25 mg total) by mouth every 2 (two) hours as needed for migraine or headache. May repeat in 2 hours if headache persists or recurs. Patient not taking: Reported on 08/03/2014 02/06/14   Benjamine Mola, FNP   Triage Vitals: BP 140/100 mmHg  Pulse 101  Temp(Src) 98.3 F (36.8 C) (Oral)  Resp 19  SpO2 96%   Physical Exam  Constitutional: He is oriented to person,  place, and time. He appears well-developed and well-nourished.  HENT:  Head: Normocephalic.  Significant dental decay ntoed  Tenderness to palpation to tooth  17 and 18 No lesions noted No trismus Tenderness to palpation and swelling to mandibular region  Eyes: EOM are normal.  Neck: Normal range of motion.  Pulmonary/Chest: Effort normal.  Abdominal: He exhibits no distension.  Musculoskeletal: Normal range of motion.  Lymphadenopathy:    He has cervical adenopathy (Anterior).  Neurological: He is alert and oriented to person, place, and time.  Psychiatric: He has a normal mood and affect.  Nursing note and vitals reviewed.   ED Course  Procedures (including critical care time)  DIAGNOSTIC STUDIES: Oxygen Saturation is 96% on RA, adequate by my interpretation.    COORDINATION OF CARE: 7:23 PM- periapical abscess with facial involvement.  No airway involvement.  Will start on course of Clindamycin and provide prescription for Ibuprofen. Pt will follow with oral surgeon 08/20/14 as planned. Discussed treatment plan with pt at bedside and pt agreed to plan.  Pt made aware to avoid tylenol containing product given prior hx of liver cirrhosis 2/2 tylenol overdose.    Labs Review Labs Reviewed - No data to display  Imaging Review No results found.   EKG Interpretation None      MDM   Final diagnoses:  Periapical abscess with facial involvement    BP 140/100 mmHg  Pulse 101  Temp(Src) 98.3 F (36.8 C) (Oral)  Resp 19  SpO2 96%   I personally performed the services described in this documentation, which was scribed in my presence. The recorded information has been reviewed and is accurate.    Domenic Moras, PA-C 08/15/14 1932  Debby Freiberg, MD 08/16/14 [phone]

## 2014-08-15 NOTE — Discharge Instructions (Signed)
Abscessed Tooth An abscessed tooth is an infection around your tooth. It may be caused by holes or damage to the tooth (cavity) or a dental disease. An abscessed tooth causes mild to very bad pain in and around the tooth. See your dentist right away if you have tooth or gum pain. HOME CARE  Take your medicine as told. Finish it even if you start to feel better.  Do not drive after taking pain medicine.  Rinse your mouth (gargle) often with salt water ( teaspoon salt in 8 ounces of warm water).  Do not apply heat to the outside of your face. GET HELP RIGHT AWAY IF:   You have a temperature by mouth above 102 F (38.9 C), not controlled by medicine.  You have chills and a very bad headache.  You have problems breathing or swallowing.  Your mouth will not open.  You develop puffiness (swelling) on the neck or around the eye.  Your pain is not helped by medicine.  Your pain is getting worse instead of better. MAKE SURE YOU:   Understand these instructions.  Will watch your condition.  Will get help right away if you are not doing well or get worse. Document Released: 12/22/2007 Document Revised: 09/27/2011 Document Reviewed: 10/13/2010 Exit[employer] Patient Information 2015 [employer]. This information is not intended to replace advice given to you by your health care provider. Make sure you discuss any questions you have with your health care provider.  

## 2014-08-29 ENCOUNTER — Emergency Department (HOSPITAL_COMMUNITY)
Admission: EM | Admit: 2014-08-29 | Discharge: 2014-08-29 | Disposition: A | Discharge status: Home / Self Care | Payer: Self-pay | Attending: Emergency Medicine | Admitting: Emergency Medicine

## 2014-08-29 ENCOUNTER — Encounter (HOSPITAL_COMMUNITY): Payer: Self-pay | Admitting: Emergency Medicine

## 2014-08-29 ENCOUNTER — Emergency Department (HOSPITAL_COMMUNITY): Payer: No Typology Code available for payment source

## 2014-08-29 DIAGNOSIS — R197 Diarrhea, unspecified: Secondary | ICD-10-CM | POA: Insufficient documentation

## 2014-08-29 DIAGNOSIS — Z8639 Personal history of other endocrine, nutritional and metabolic disease: Secondary | ICD-10-CM | POA: Insufficient documentation

## 2014-08-29 DIAGNOSIS — Z853 Personal history of malignant neoplasm of breast: Secondary | ICD-10-CM | POA: Insufficient documentation

## 2014-08-29 DIAGNOSIS — Z905 Acquired absence of kidney: Secondary | ICD-10-CM | POA: Insufficient documentation

## 2014-08-29 DIAGNOSIS — F1010 Alcohol abuse, uncomplicated: Secondary | ICD-10-CM | POA: Insufficient documentation

## 2014-08-29 DIAGNOSIS — I10 Essential (primary) hypertension: Secondary | ICD-10-CM | POA: Insufficient documentation

## 2014-08-29 DIAGNOSIS — R1030 Lower abdominal pain, unspecified: Secondary | ICD-10-CM | POA: Insufficient documentation

## 2014-08-29 DIAGNOSIS — R112 Nausea with vomiting, unspecified: Secondary | ICD-10-CM | POA: Insufficient documentation

## 2014-08-29 DIAGNOSIS — E785 Hyperlipidemia, unspecified: Secondary | ICD-10-CM | POA: Insufficient documentation

## 2014-08-29 DIAGNOSIS — F319 Bipolar disorder, unspecified: Secondary | ICD-10-CM | POA: Insufficient documentation

## 2014-08-29 DIAGNOSIS — Z85528 Personal history of other malignant neoplasm of kidney: Secondary | ICD-10-CM | POA: Insufficient documentation

## 2014-08-29 DIAGNOSIS — I2510 Atherosclerotic heart disease of native coronary artery without angina pectoris: Secondary | ICD-10-CM | POA: Insufficient documentation

## 2014-08-29 DIAGNOSIS — F419 Anxiety disorder, unspecified: Secondary | ICD-10-CM | POA: Insufficient documentation

## 2014-08-29 LAB — URINALYSIS, ROUTINE W REFLEX MICROSCOPIC
Glucose, UA: NEGATIVE mg/dL
HGB URINE DIPSTICK: NEGATIVE
Ketones, ur: 15 mg/dL — AB
Leukocytes, UA: NEGATIVE
Nitrite: NEGATIVE
PH: 7 (ref 5.0–8.0)
PROTEIN: 30 mg/dL — AB
Specific Gravity, Urine: 1.022 (ref 1.005–1.030)
Urobilinogen, UA: 2 mg/dL — ABNORMAL HIGH (ref 0.0–1.0)

## 2014-08-29 LAB — COMPREHENSIVE METABOLIC PANEL
ALBUMIN: 4.3 g/dL (ref 3.5–5.2)
ALT: 221 U/L — ABNORMAL HIGH (ref 0–53)
AST: 263 U/L — ABNORMAL HIGH (ref 0–37)
Alkaline Phosphatase: 60 U/L (ref 39–117)
Anion gap: 13 (ref 5–15)
BILIRUBIN TOTAL: 0.9 mg/dL (ref 0.3–1.2)
BUN: 11 mg/dL (ref 6–23)
CALCIUM: 9.3 mg/dL (ref 8.4–10.5)
CHLORIDE: 101 mmol/L (ref 96–112)
CO2: 25 mmol/L (ref 19–32)
Creatinine, Ser: 0.88 mg/dL (ref 0.50–1.35)
GFR calc Af Amer: >90 mL/min (ref >90)
GFR calc non Af Amer: >90 mL/min (ref >90)
Glucose, Bld: 80 mg/dL (ref 70–99)
Potassium: 4 mmol/L (ref 3.5–5.1)
Sodium: 139 mmol/L (ref 135–145)
Total Protein: 7.8 g/dL (ref 6.0–8.3)

## 2014-08-29 LAB — CBC
HCT: 43.8 % (ref 39.0–52.0)
HEMOGLOBIN: 15.3 g/dL (ref 13.0–17.0)
MCH: 31.9 pg (ref 26.0–34.0)
MCHC: 34.9 g/dL (ref 30.0–36.0)
MCV: 91.3 fL (ref 78.0–100.0)
Platelets: 158 10*3/uL (ref 150–400)
RBC: 4.8 MIL/uL (ref 4.22–5.81)
RDW: 13.5 % (ref 11.5–15.5)
WBC: 4.3 10*3/uL (ref 4.0–10.5)

## 2014-08-29 LAB — ETHANOL: Alcohol, Ethyl (B): <5 mg/dL (ref 0–9)

## 2014-08-29 LAB — URINE MICROSCOPIC-ADD ON

## 2014-08-29 LAB — PROTIME-INR
INR: 0.98 (ref 0.00–1.49)
Prothrombin Time: 13.1 seconds (ref 11.6–15.2)

## 2014-08-29 LAB — LIPASE, BLOOD: Lipase: 17 U/L (ref 11–59)

## 2014-08-29 LAB — POC OCCULT BLOOD, ED: Fecal Occult Bld: NEGATIVE

## 2014-08-29 MED ORDER — DIPHENHYDRAMINE HCL 50 MG/ML IJ SOLN
25.0000 mg | Freq: Once | INTRAMUSCULAR | Status: AC
  Administered 2014-08-29: 25 mg via INTRAVENOUS
  Filled 2014-08-29: qty 1

## 2014-08-29 MED ORDER — DIPHENHYDRAMINE HCL 25 MG PO TABS: 25.0000 mg | ORAL_TABLET | Freq: Four times a day (QID) | ORAL | Status: DC

## 2014-08-29 MED ORDER — IOHEXOL 300 MG/ML  SOLN
50.0000 mL | Freq: Once | INTRAMUSCULAR | Status: AC | PRN
  Administered 2014-08-29: 50 mL via ORAL

## 2014-08-29 MED ORDER — DIPHENHYDRAMINE HCL 50 MG/ML IJ SOLN
25.0000 mg | Freq: Once | INTRAMUSCULAR | Status: AC
  Administered 2014-08-29: 25 mg via INTRAVENOUS

## 2014-08-29 MED ORDER — PREDNISONE 20 MG PO TABS: ORAL_TABLET | ORAL | Status: DC

## 2014-08-29 MED ORDER — HYDROMORPHONE HCL 1 MG/ML IJ SOLN
1.0000 mg | Freq: Once | INTRAMUSCULAR | Status: AC
  Administered 2014-08-29: 1 mg via INTRAVENOUS
  Filled 2014-08-29: qty 1

## 2014-08-29 MED ORDER — ONDANSETRON 8 MG PO TBDP
8.0000 mg | ORAL_TABLET | Freq: Once | ORAL | Status: AC
Start: 2014-08-29 — End: 2014-08-29
  Administered 2014-08-29: 8 mg via ORAL
  Filled 2014-08-29: qty 1

## 2014-08-29 MED ORDER — DIPHENHYDRAMINE HCL 50 MG/ML IJ SOLN
INTRAMUSCULAR | Status: AC
  Filled 2014-08-29: qty 1

## 2014-08-29 MED ORDER — IOHEXOL 300 MG/ML  SOLN
100.0000 mL | Freq: Once | INTRAMUSCULAR | Status: AC | PRN
  Administered 2014-08-29: 100 mL via INTRAVENOUS

## 2014-08-29 MED ORDER — HYDROCORTISONE NA SUCCINATE PF 100 MG IJ SOLR
200.0000 mg | Freq: Once | INTRAMUSCULAR | Status: AC
  Administered 2014-08-29: 200 mg via INTRAVENOUS
  Filled 2014-08-29: qty 4

## 2014-08-29 MED ORDER — HYDROXYZINE HCL 25 MG PO TABS
25.0000 mg | ORAL_TABLET | Freq: Once | ORAL | Status: AC
  Administered 2014-08-29: 25 mg via ORAL
  Filled 2014-08-29: qty 1

## 2014-08-29 MED ORDER — SODIUM CHLORIDE 0.9 % IV BOLUS (SEPSIS)
1000.0000 mL | Freq: Once | INTRAVENOUS | Status: AC
  Administered 2014-08-29: 1000 mL via INTRAVENOUS

## 2014-08-29 NOTE — ED Provider Notes (Signed)
CSN: [id number]     Arrival date & time 08/29/14  1439 History   First MD Initiated Contact with Patient 08/29/14 1629     Chief Complaint  Patient presents with  . Abdominal Pain  . Rectal Bleeding     (Consider location/radiation/quality/duration/timing/severity/associated sxs/prior Treatment) HPI Pt is a  34yo male with hx of seizures, HTN, hypercholesteremia, pancreatitis, liver cirrhosis, CAD, angina, SOB, PVD, cancer of kidney with left nephrectomy in 04/2013 at [facility], bilateral breast cancer with bilateral mastectomy and oral meds on 2009, depression, schiophrenia, bipolar 1 disorder, and suicide attempt with OD, presenting to ED with c/o abdominal pain, worse in lower abdomen, with associated nausea, vomiting, and loose stool with red blood in stool for last 3 days. Reports about 2 BMs for last 3 days with red blood in stool. Abdominal pain gradually worsening.  Pain is constant, aching and cramping, 10/10.  Reports hot and cold chills w/o measured fever.  Denies sick contacts or recent travel. Pt does report drinking 1 to 1.5 gal of liquor daily for the last 1 week, more so during the recent Super[employer]l. States he has not been eating much. Denies drug use.  Denies urinary symptoms. Denies hx of similar symptoms with bloody stool.    Past Medical History  Diagnosis Date  . Seizures   . Hypertension   . Depression   . Pancreatitis   . Liver cirrhosis   . Coronary artery disease   . Angina   . Shortness of breath   . Headache(784.0)   . Peripheral vascular disease April 2011    Left Pop  . Hypercholesteremia   . Schizophrenia   . Bipolar 1 disorder   . Cancer of kidney dx'd 04/2013    lt nephrectomy  . Breast CA dx'd 2009    bil w/ bil masectomy and oral meds  . H/O suicide attempt 2015    overdose   Past Surgical History  Procedure Laterality Date  . Chest surgery    . Left leg surgery    . Mastectomy    . Left kidney removal    . Breast surgery     Family History   Problem Relation Age of Onset  . Stroke Other    History  Substance Use Topics  . Smoking status: Current Every Day Smoker -- 0.10 packs/day for 4 years    Types: Cigarettes  . Smokeless tobacco: Never Used  . Alcohol Use: 0.0 oz/week     Comment: 3 bottles of wine daily    Review of Systems  Constitutional: Positive for fever ( subjective), chills, diaphoresis and appetite change.  Respiratory: Negative for cough and shortness of breath.   Gastrointestinal: Positive for nausea, vomiting, abdominal pain, diarrhea and blood in stool. Negative for constipation, anal bleeding and rectal pain.  Genitourinary: Negative for dysuria, frequency, hematuria, flank pain, discharge and penile pain.  Musculoskeletal: Positive for back pain ( mild, lower). Negative for myalgias.  All other systems reviewed and are negative.     Allergies  Codeine; Depakote; Morphine; Penicillins; and Hydrocodone-acetaminophen  Home Medications   Prior to Admission medications   Medication Sig Start Date End Date Taking? Authorizing Provider  amLODipine (NORVASC) 5 MG tablet Take 1 tablet (5 mg total) by mouth daily. 08/06/14  Yes Marianne L York, PA-C  ARIPiprazole (ABILIFY) 2 MG tablet Take 2 mg by mouth daily.   Yes Historical Provider, MD  DULoxetine (CYMBALTA) 30 MG capsule Take 1 capsule (30 mg total) by mouth daily.  08/06/14  Yes Marianne L York, PA-C  gabapentin (NEURONTIN) 100 MG capsule Take 2 capsules (200 mg total) by mouth 3 (three) times daily. 08/06/14  Yes Bobby Rumpf York, PA-C  HYDROcodone-acetaminophen (NORCO/VICODIN) 5-325 MG per tablet Take 1 tablet by mouth every 4 (four) hours as needed for moderate pain or severe pain (pain).   Yes Historical Provider, MD  metoprolol (LOPRESSOR) 50 MG tablet Take 1 tablet (50 mg total) by mouth 2 (two) times daily. 04/18/14  Yes Marianne L York, PA-C  mirtazapine (REMERON SOL-TAB) 15 MG disintegrating tablet Take 1 tablet (15 mg total) by mouth at bedtime.  08/06/14  Yes Marianne L York, PA-C  risperiDONE (RISPERDAL) 1 MG tablet Take 1 tablet (1 mg total) by mouth daily. 08/06/14  Yes Bobby Rumpf York, PA-C  clindamycin (CLEOCIN) 150 MG capsule Take 1 capsule (150 mg total) by mouth every 6 (six) hours. Patient not taking: Reported on 08/29/2014 08/15/14   Domenic Moras, PA-C  diphenhydrAMINE (BENADRYL) 25 MG tablet Take 1 tablet (25 mg total) by mouth every 6 (six) hours. 08/29/14   Noland Fordyce, PA-C  hydrOXYzine (ATARAX/VISTARIL) 25 MG tablet Take 1 tablet (25 mg total) by mouth every 6 (six) hours as needed for itching, nausea or vomiting. 08/06/14   Bobby Rumpf York, PA-C  ibuprofen (ADVIL,MOTRIN) 800 MG tablet Take 1 tablet (800 mg total) by mouth every 8 (eight) hours as needed for moderate pain. Patient not taking: Reported on 08/29/2014 08/15/14   Domenic Moras, PA-C  nicotine (NICODERM CQ - DOSED IN MG/24 HOURS) 14 mg/24hr patch Place 1 patch (14 mg total) onto the skin daily. Patient not taking: Reported on 08/29/2014 08/06/14   Bobby Rumpf York, PA-C  ondansetron (ZOFRAN) 4 MG tablet Take 1 tablet (4 mg total) by mouth every 6 (six) hours as needed for nausea. Patient not taking: Reported on 08/03/2014 04/18/14   Melton Alar, PA-C  predniSONE (DELTASONE) 20 MG tablet 2 tabs po daily x 3 days 08/29/14   Noland Fordyce, PA-C  SUMAtriptan (IMITREX) 25 MG tablet Take 1 tablet (25 mg total) by mouth every 2 (two) hours as needed for migraine or headache. May repeat in 2 hours if headache persists or recurs. Patient not taking: Reported on 08/03/2014 02/06/14   Elyse Jarvis Withrow, FNP   BP 160/108 mmHg  Pulse 104  Temp(Src) 98.2 F (36.8 C) (Oral)  Resp 18  SpO2 95% Physical Exam  Constitutional: He appears well-developed and well-nourished.  Mildly anxious appearing.  HENT:  Head: Normocephalic and atraumatic.  Eyes: Conjunctivae are normal. No scleral icterus.  Neck: Normal range of motion.  Cardiovascular: Normal rate, regular rhythm and normal heart  sounds.   Pulmonary/Chest: Effort normal and breath sounds normal. No respiratory distress. He has no wheezes. He has no rales. He exhibits no tenderness.  Abdominal: Soft. Bowel sounds are normal. He exhibits no distension and no mass. There is tenderness. There is no rebound and no guarding.  Genitourinary: Rectum normal. Rectal exam shows no external hemorrhoid, no internal hemorrhoid, no fissure, no mass, no tenderness and anal tone normal. Guaiac negative stool.  Chaperoned exam. Rectal exam: no external hemorrhoids, no frank red blood. Soft light brown stool on glove. Normal rectal tone.  Musculoskeletal: Normal range of motion.  Neurological: He is alert.  Skin: Skin is warm and dry.  Psychiatric: His mood appears anxious. He expresses no homicidal and no suicidal ideation.  Nursing note and vitals reviewed.   ED Course  Procedures (including critical  care time) Labs Review Labs Reviewed  COMPREHENSIVE METABOLIC PANEL - Abnormal; Notable for the following:    AST 263 (*)    ALT 221 (*)    All other components within normal limits  URINALYSIS, ROUTINE W REFLEX MICROSCOPIC - Abnormal; Notable for the following:    Color, Urine AMBER (*)    Bilirubin Urine SMALL (*)    Ketones, ur 15 (*)    Protein, ur 30 (*)    Urobilinogen, UA 2.0 (*)    All other components within normal limits  CBC  LIPASE, BLOOD  URINE MICROSCOPIC-ADD ON  ETHANOL  PROTIME-INR  POC OCCULT BLOOD, ED    Imaging Review Ct Abdomen Pelvis W Contrast  08/29/2014   CLINICAL DATA:  Abdominal pain, emesis, and blood in stool for 3 days. Rectal bleeding.  EXAM: CT ABDOMEN AND PELVIS WITH CONTRAST  TECHNIQUE: Multidetector CT imaging of the abdomen and pelvis was performed using the standard protocol following bolus administration of intravenous contrast.  CONTRAST:  15mL OMNIPAQUE IOHEXOL 300 MG/ML SOLN, 155mL OMNIPAQUE IOHEXOL 300 MG/ML SOLN  COMPARISON:  04/27/2013  FINDINGS: Lung bases are clear. Scarring in  infiltration in the subcutaneous fat over the right breast appears to been present previously and has been attributed to silicone breast injections. No calcification. Mild skin thickening.  Diffuse fatty infiltration of the liver. The gallbladder, spleen, pancreas, adrenal glands, abdominal aorta, inferior vena cava, and retroperitoneal lymph nodes are unremarkable. Postsurgical changes demonstrated in the inferior pole left kidney consistent with previous partial nephrectomy. No hydronephrosis in either kidney. Stomach, small bowel, and colon appear normal for degree of distention. Contrast material flows through to the colon without evidence of bowel obstruction. Decompression of some loops limits evaluation of the bowel wall. No free air or free fluid in the abdomen. Abdominal wall musculature appears intact.  Pelvis: The appendix is normal. Rectosigmoid colon appears normal. Bladder wall is not thickened. Prostate gland is not enlarged. No free or loculated pelvic fluid collections. No pelvic mass or lymphadenopathy. Nodular infiltration in the subcutaneous fat over the buttocks regions similar to previous study. Query additional silicon injections.  IMPRESSION: No focal acute process demonstrated in the abdomen or pelvis. Diffuse fatty infiltration of the liver. No evidence of bowel obstruction. Evaluation of bowel wall is limited due to under distention. Nodular infiltration demonstrated the right breast and in the gluteal regions without change since prior study and possibly related to silicone injections.   Electronically Signed   By: Lucienne Capers M.D.   On: 08/29/2014 20:47     EKG Interpretation None      MDM   Final diagnoses:  Lower abdominal pain  Nausea vomiting and diarrhea  Alcohol abuse   Pt is a 34yo male with hx of alcohol abuse, liver cirrhosis, and pancreatitis, presenting to ED with c/o abdominal pain, vomiting and loose stools with blood for 3 days.  On exam, pt appears  mildly anxious, Abdomen is soft, tender in lower abdomen w/o rebound or guarding. Rectal exam: hemoccult negative.   Labs: significant for elevated LFTs. Medical records reviewed in [place] from [place].  Last LFTs on 07/23/14 were AST-59 ALT-42.  Today, AST-263, ALT-221.  Concern for pancreatitis, acute liver failure, cholecystitis.   Lipase: 17, PT-INR: WNL CT abdomen: unremarkable.  Elevated LFTs like due to reported recent binge drinking within last 1 week. Etoh in ED <5 but pt reports last drink was last night.  Denies drug use. Denies SI.   Pt is  open to receiving rehab for his alcohol abuse and states he plans on getting into a rehab program. Pt did have significant itching while in ED after given 1mg  Dilaudid IV as well as after contrast, however, no evidence of anaphylaxis in ED.  Pt hemodynamically stable for discharge home with 3 day course of prednisone as well as benadryl. Strongly encouraged pt have close f/u with his PCP to have recheck of LFTs within 1 week. Return precautions provided. Pt verbalized understanding and agreement with tx plan.  Discussed pt with Dr. Regenia Skeeter who agrees with assessment and plan.  Noland Fordyce, PA-C 08/29/14 2219  [name] Hamburger, MD 08/30/14 [phone]

## 2014-08-29 NOTE — ED Notes (Signed)
Pt c/o abdominal pain, emesis, and blood in his stool x 3 days.

## 2014-08-30 IMAGING — CT CT ABD-PELV W/ CM
1 of 2 series · 14 of 32 positions shown, 18 images · IV contrast (OMNIPAQUE 300)
Comparison: 04/29/2010.

***ADDENDUM*** CREATED: 03/28/2012 [DATE]

The first sentence under #3 of the impression should read as
follows:  No significant change in a large number of small nodules
in the subcutaneous fat of the lower chest anteriorly and pelvis
laterally and posteriorly.
CLINICAL DATA: Suicide attempt with medication overdose.  Possible
HIV infection.
CT ABDOMEN AND PELVIS WITH CONTRAST
TECHNIQUE: Multidetector CT imaging of the abdomen and pelvis was
performed following the standard protocol during bolus
administration of intravenous contrast.
Contrast: 100mL OMNIPAQUE IOHEXOL 300 MG/ML  SOLN

[Series 3: abd/pel with · axial · 0.74mm/px · z∈[-763,-333]mm · 14 of 96 slices shown, 18 images]
[im 5/96  soft-tissue]
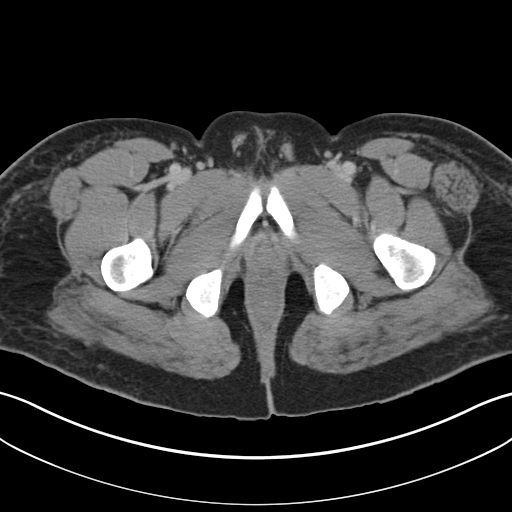
[im 5/96  bone]
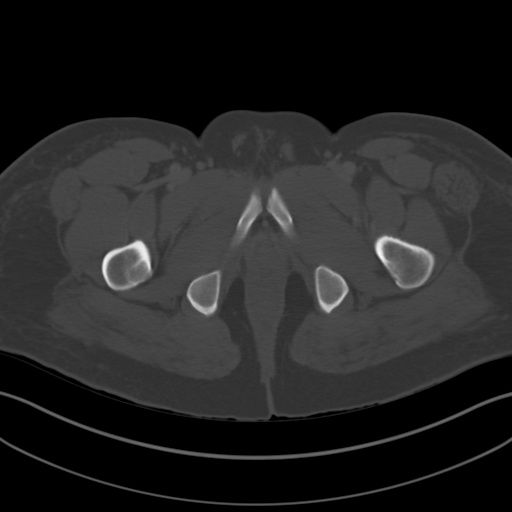
[im 13/96  soft-tissue]
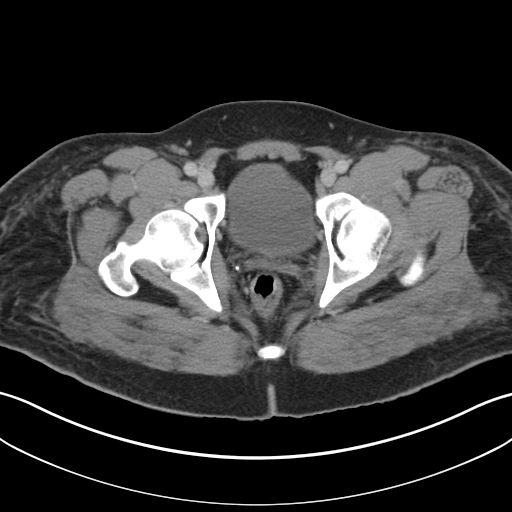
[im 21/96  soft-tissue]
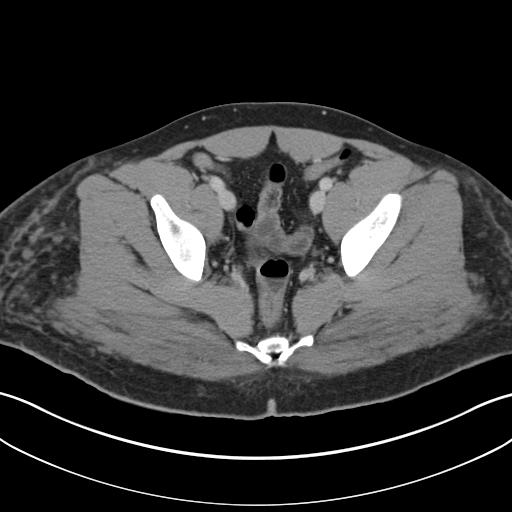
[im 29/96  soft-tissue]
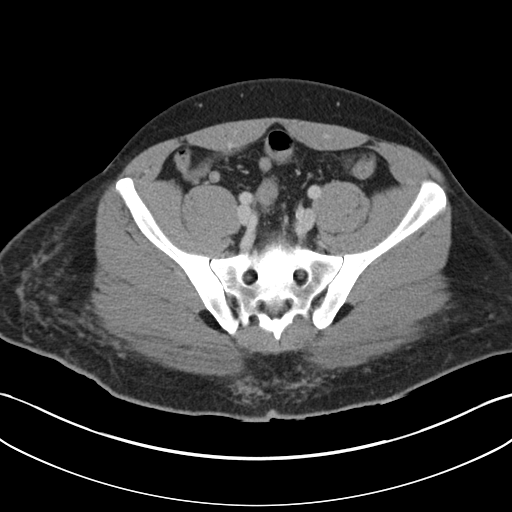
[im 38/96  soft-tissue]
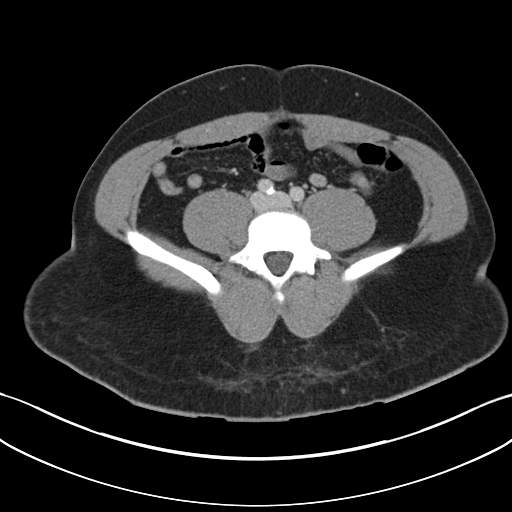
[im 46/96  soft-tissue]
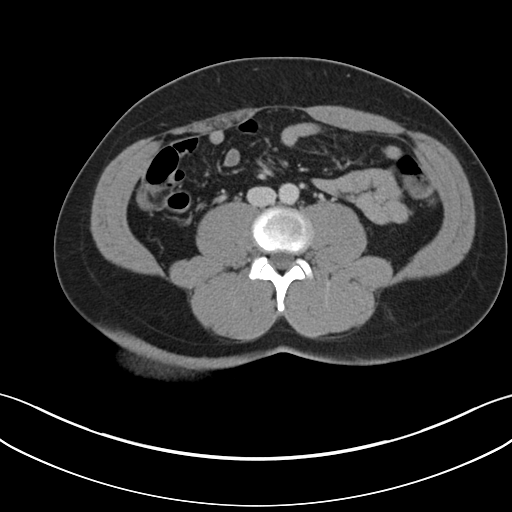
[im 50/96  soft-tissue]
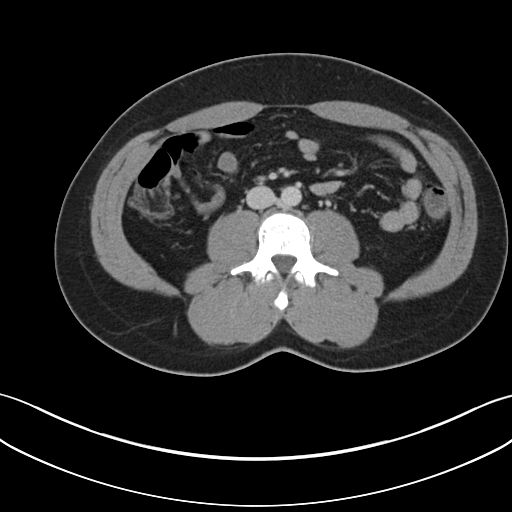
[im 58/96  soft-tissue]
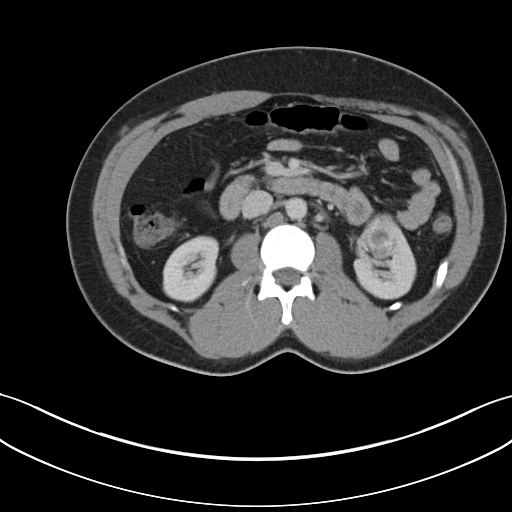
[im 67/96  soft-tissue]
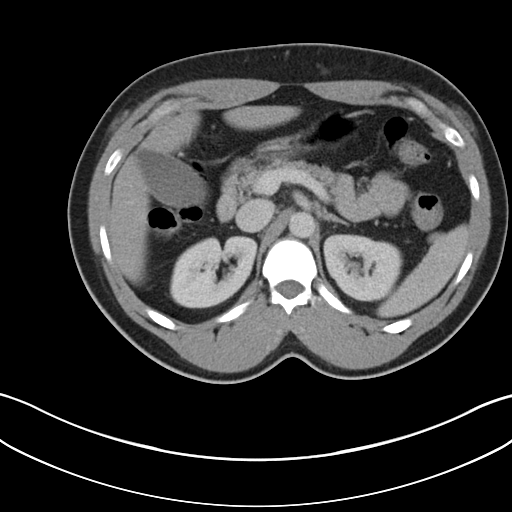
[im 67/96  bone]
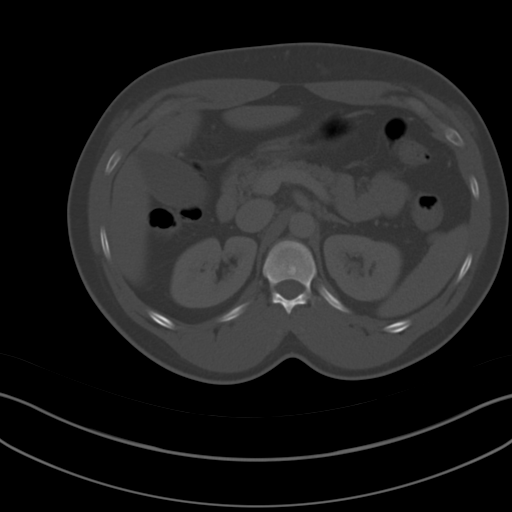
[im 75/96  soft-tissue]
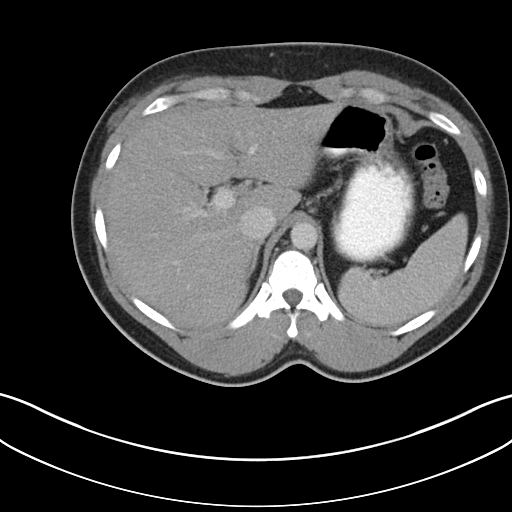
[im 79/96  lung]
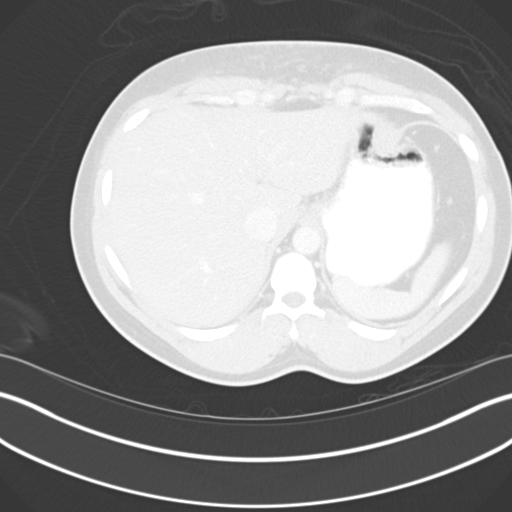
[im 83/96  soft-tissue]
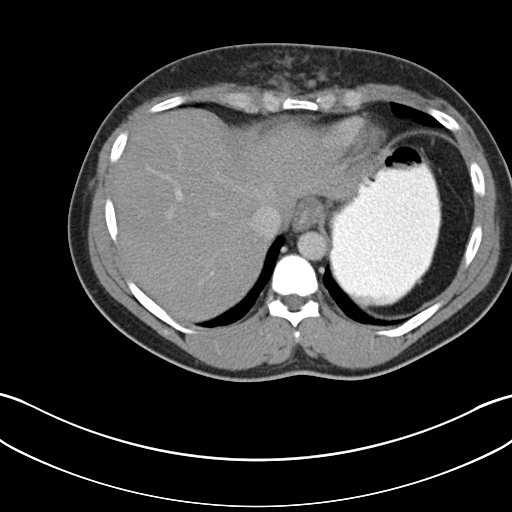
[im 83/96  lung]
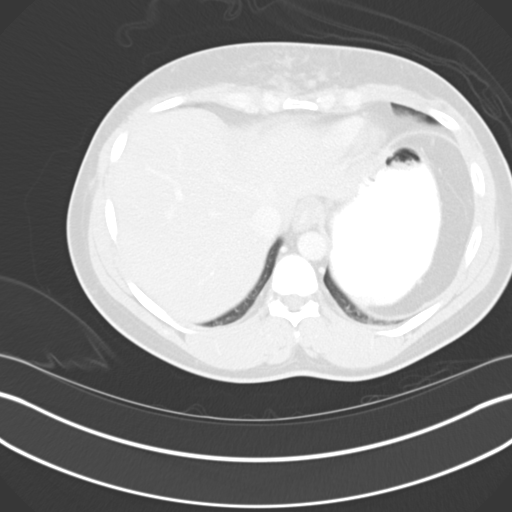
[im 87/96  lung]
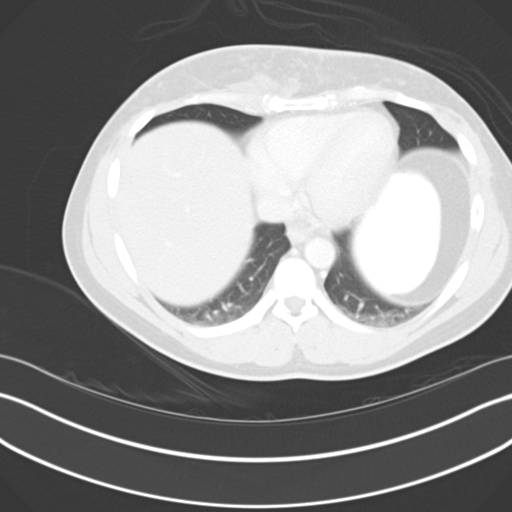
[im 91/96  soft-tissue]
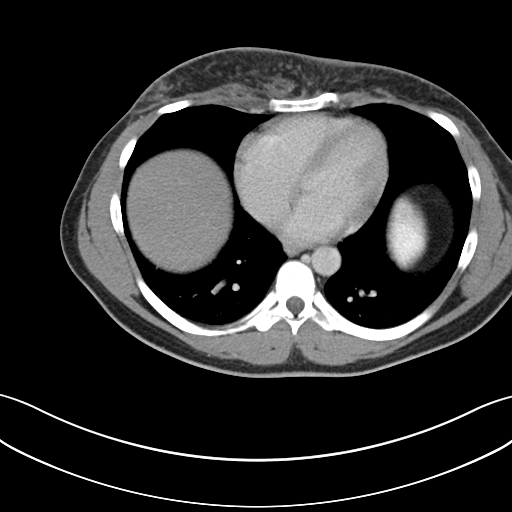
[im 91/96  lung]
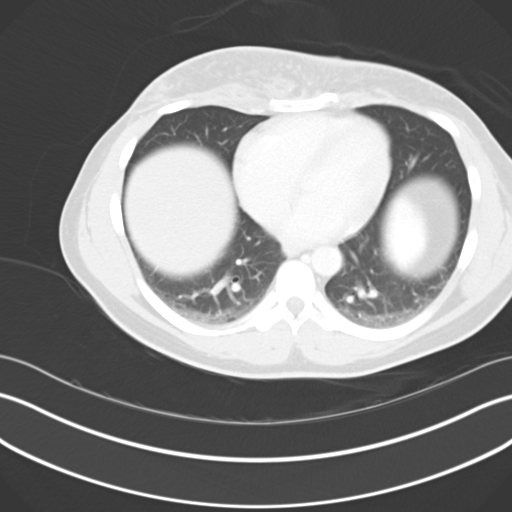

[14 of 32 positions shown; findings below may reference images not displayed]

FINDINGS: Diffuse low density of the liver relative to the spleen
is again demonstrated.  The previously demonstrated 3.0 x 2.9 cm
anterior lower pole left renal mass currently measures 3.4 x 2.9 cm
in maximum dimensions on image number 40.  This mass remains
heterogeneous with a rounded low density components which have
progressed.  No other masses are seen and no enlarged lymph nodes
are demonstrated.

Normal appearing appendix in the upper right pelvis.  No
gastrointestinal abnormalities or enlarged lymph nodes.

Normal appearing spleen, pancreas, gallbladder, adrenal glands,
right kidney and urinary bladder.  Normal appearing prostate gland.

A large number of small, rounded and oval nodular densities are
demonstrated in the subcutaneous fat at the level of the pelvis,
laterally and posteriorly.  These are not included on 04/29/2010
and were present on 03/20/2010. Similar nodules in the anterior
subcutaneous fat at the level of the lower chest are also stable.

Mild dependent atelectasis at both lung bases.  Stable small T12
vertebral body endplate sclerotic lesions, possibly representing
small bone islands or sclerosis associate with minimal Schmorl's
node formation.  Otherwise, unremarkable bones.
IMPRESSION: 1.  No acute abnormality.
2.  Minimal increase in size of the previously demonstrated
heterogeneous left renal mass with progressive low density
components.  This could represent a slowly growing renal cell
carcinoma or oncocytoma.
3.  This did not change in a large number of small nodules in the
subcutaneous fat of the lower chest anteriorly and pelvis laterally
and posteriorly. These may represent injection granulomata.  The
patient previously had a history of bilateral breast silicone
injections for augmentation.  Therefore, these are most likely
silicone granulomas from injections of silicone in the anterior
chest and in the buttocks.
4.  Previously demonstrated hepatic steatosis.

## 2014-09-09 ENCOUNTER — Encounter (HOSPITAL_COMMUNITY): Payer: Self-pay | Admitting: Emergency Medicine

## 2014-09-09 ENCOUNTER — Emergency Department (HOSPITAL_COMMUNITY): Payer: No Typology Code available for payment source

## 2014-09-09 ENCOUNTER — Emergency Department (HOSPITAL_COMMUNITY)
Admission: EM | Admit: 2014-09-09 | Discharge: 2014-09-10 | Disposition: A | Discharge status: Home / Self Care | Payer: Self-pay | Attending: Emergency Medicine | Admitting: Emergency Medicine

## 2014-09-09 ENCOUNTER — Emergency Department (HOSPITAL_COMMUNITY): Payer: Self-pay

## 2014-09-09 DIAGNOSIS — I25119 Atherosclerotic heart disease of native coronary artery with unspecified angina pectoris: Secondary | ICD-10-CM | POA: Insufficient documentation

## 2014-09-09 DIAGNOSIS — Z8719 Personal history of other diseases of the digestive system: Secondary | ICD-10-CM | POA: Insufficient documentation

## 2014-09-09 DIAGNOSIS — Z720 Tobacco use: Secondary | ICD-10-CM | POA: Insufficient documentation

## 2014-09-09 DIAGNOSIS — Z8639 Personal history of other endocrine, nutritional and metabolic disease: Secondary | ICD-10-CM | POA: Insufficient documentation

## 2014-09-09 DIAGNOSIS — Z880 Allergy status to penicillin: Secondary | ICD-10-CM | POA: Insufficient documentation

## 2014-09-09 DIAGNOSIS — Z7952 Long term (current) use of systemic steroids: Secondary | ICD-10-CM | POA: Insufficient documentation

## 2014-09-09 DIAGNOSIS — R000 Tachycardia, unspecified: Secondary | ICD-10-CM | POA: Insufficient documentation

## 2014-09-09 DIAGNOSIS — Z87442 Personal history of urinary calculi: Secondary | ICD-10-CM | POA: Insufficient documentation

## 2014-09-09 DIAGNOSIS — Z853 Personal history of malignant neoplasm of breast: Secondary | ICD-10-CM | POA: Insufficient documentation

## 2014-09-09 DIAGNOSIS — G40909 Epilepsy, unspecified, not intractable, without status epilepticus: Secondary | ICD-10-CM | POA: Insufficient documentation

## 2014-09-09 DIAGNOSIS — R45851 Suicidal ideations: Secondary | ICD-10-CM

## 2014-09-09 DIAGNOSIS — Z79899 Other long term (current) drug therapy: Secondary | ICD-10-CM | POA: Insufficient documentation

## 2014-09-09 DIAGNOSIS — F1910 Other psychoactive substance abuse, uncomplicated: Secondary | ICD-10-CM

## 2014-09-09 DIAGNOSIS — R4182 Altered mental status, unspecified: Secondary | ICD-10-CM | POA: Insufficient documentation

## 2014-09-09 DIAGNOSIS — I10 Essential (primary) hypertension: Secondary | ICD-10-CM | POA: Insufficient documentation

## 2014-09-09 LAB — COMPREHENSIVE METABOLIC PANEL
ALBUMIN: 3.8 g/dL (ref 3.5–5.2)
ALT: 108 U/L — AB (ref 0–53)
AST: 139 U/L — ABNORMAL HIGH (ref 0–37)
Alkaline Phosphatase: 52 U/L (ref 39–117)
Anion gap: 8 (ref 5–15)
BUN: <5 mg/dL — ABNORMAL LOW (ref 6–23)
CO2: 25 mmol/L (ref 19–32)
Calcium: 8.8 mg/dL (ref 8.4–10.5)
Chloride: 105 mmol/L (ref 96–112)
Creatinine, Ser: 0.87 mg/dL (ref 0.50–1.35)
GFR calc non Af Amer: >90 mL/min (ref >90)
Glucose, Bld: 95 mg/dL (ref 70–99)
Potassium: 3.5 mmol/L (ref 3.5–5.1)
Sodium: 138 mmol/L (ref 135–145)
Total Bilirubin: 0.6 mg/dL (ref 0.3–1.2)
Total Protein: 7.4 g/dL (ref 6.0–8.3)

## 2014-09-09 LAB — SALICYLATE LEVEL: Salicylate Lvl: <4 mg/dL (ref 2.8–20.0)

## 2014-09-09 LAB — I-STAT CG4 LACTIC ACID, ED: Lactic Acid, Venous: 2.37 mmol/L (ref 0.5–2.0)

## 2014-09-09 LAB — CBC
HEMATOCRIT: 41.2 % (ref 39.0–52.0)
HEMOGLOBIN: 14.3 g/dL (ref 13.0–17.0)
MCH: 31.8 pg (ref 26.0–34.0)
MCHC: 34.7 g/dL (ref 30.0–36.0)
MCV: 91.8 fL (ref 78.0–100.0)
Platelets: 136 10*3/uL — ABNORMAL LOW (ref 150–400)
RBC: 4.49 MIL/uL (ref 4.22–5.81)
RDW: 13.9 % (ref 11.5–15.5)
WBC: 3.7 10*3/uL — AB (ref 4.0–10.5)

## 2014-09-09 LAB — I-STAT CHEM 8, ED
CREATININE: 1.4 mg/dL — AB (ref 0.50–1.35)
Calcium, Ion: 1.13 mmol/L (ref 1.12–1.23)
Chloride: 102 mmol/L (ref 96–112)
Glucose, Bld: 98 mg/dL (ref 70–99)
HCT: 46 % (ref 39.0–52.0)
Hemoglobin: 15.6 g/dL (ref 13.0–17.0)
POTASSIUM: 3.5 mmol/L (ref 3.5–5.1)
Sodium: 140 mmol/L (ref 135–145)
TCO2: 22 mmol/L (ref 0–100)

## 2014-09-09 LAB — RAPID URINE DRUG SCREEN, HOSP PERFORMED
AMPHETAMINES: NOT DETECTED
BENZODIAZEPINES: NOT DETECTED
Barbiturates: NOT DETECTED
Cocaine: POSITIVE — AB
OPIATES: NOT DETECTED
Tetrahydrocannabinol: NOT DETECTED

## 2014-09-09 LAB — ETHANOL: Alcohol, Ethyl (B): 298 mg/dL — ABNORMAL HIGH (ref 0–9)

## 2014-09-09 LAB — ACETAMINOPHEN LEVEL: Acetaminophen (Tylenol), Serum: <10 ug/mL — ABNORMAL LOW (ref 10–30)

## 2014-09-09 LAB — CBG MONITORING, ED: Glucose-Capillary: 89 mg/dL (ref 70–99)

## 2014-09-09 MED ORDER — NALOXONE HCL 0.4 MG/ML IJ SOLN
0.4000 mg | Freq: Once | INTRAMUSCULAR | Status: AC
  Administered 2014-09-09: 0.4 mg via INTRAVENOUS
  Filled 2014-09-09: qty 1

## 2014-09-09 MED ORDER — SODIUM CHLORIDE 0.9 % IV BOLUS (SEPSIS)
1000.0000 mL | Freq: Once | INTRAVENOUS | Status: AC
  Administered 2014-09-09: 1000 mL via INTRAVENOUS

## 2014-09-09 NOTE — ED Notes (Signed)
Patient currently trying to talk to this nurse, but speech is extremely low and slurred.  He was found by other staff members to be attempting to climb out of stretcher.  Repositioned for safety.

## 2014-09-09 NOTE — ED Notes (Signed)
Pt mother reports psych hx, pt is suicidal. Pt had mother come pick him up at 28, came down to car staggering a little, said he had to go back to apartment. Was in apartment about 3 min, came back down to car. Hit head on door when he got in, then suddenly went unresponsive.

## 2014-09-09 NOTE — ED Notes (Signed)
Attempted x3 to get second IV access.  All 3 attempts unsuccessful.

## 2014-09-09 NOTE — ED Notes (Signed)
Dr Alvino Chapel made aware of abnormal lactic

## 2014-09-09 NOTE — ED Notes (Signed)
Patient found to be covered in urine, resting with eyes closed.  Patient cleaned and linens changed.

## 2014-09-10 ENCOUNTER — Inpatient Hospital Stay (HOSPITAL_COMMUNITY)
Admission: AD | Admit: 2014-09-10 | Discharge: 2014-09-13 | DRG: 897 | Disposition: A | Discharge status: Home / Self Care | Payer: Federal, State, Local not specified - Other | Source: Intra-hospital | Attending: Psychiatry | Admitting: Psychiatry

## 2014-09-10 ENCOUNTER — Encounter (HOSPITAL_COMMUNITY): Payer: Self-pay | Admitting: *Deleted

## 2014-09-10 DIAGNOSIS — Z853 Personal history of malignant neoplasm of breast: Secondary | ICD-10-CM | POA: Diagnosis not present

## 2014-09-10 DIAGNOSIS — F17210 Nicotine dependence, cigarettes, uncomplicated: Secondary | ICD-10-CM | POA: Diagnosis present

## 2014-09-10 DIAGNOSIS — F39 Unspecified mood [affective] disorder: Secondary | ICD-10-CM | POA: Diagnosis present

## 2014-09-10 DIAGNOSIS — F333 Major depressive disorder, recurrent, severe with psychotic symptoms: Secondary | ICD-10-CM | POA: Insufficient documentation

## 2014-09-10 DIAGNOSIS — I10 Essential (primary) hypertension: Secondary | ICD-10-CM | POA: Diagnosis present

## 2014-09-10 DIAGNOSIS — F323 Major depressive disorder, single episode, severe with psychotic features: Secondary | ICD-10-CM | POA: Diagnosis present

## 2014-09-10 DIAGNOSIS — F1094 Alcohol use, unspecified with alcohol-induced mood disorder: Secondary | ICD-10-CM | POA: Diagnosis present

## 2014-09-10 DIAGNOSIS — T391X2A Poisoning by 4-Aminophenol derivatives, intentional self-harm, initial encounter: Secondary | ICD-10-CM

## 2014-09-10 DIAGNOSIS — F1024 Alcohol dependence with alcohol-induced mood disorder: Principal | ICD-10-CM | POA: Diagnosis present

## 2014-09-10 DIAGNOSIS — R45851 Suicidal ideations: Secondary | ICD-10-CM | POA: Diagnosis present

## 2014-09-10 DIAGNOSIS — F1910 Other psychoactive substance abuse, uncomplicated: Secondary | ICD-10-CM

## 2014-09-10 MED ORDER — LORAZEPAM 1 MG PO TABS: 0.0000 mg | ORAL_TABLET | Freq: Two times a day (BID) | ORAL | Status: DC

## 2014-09-10 MED ORDER — ONDANSETRON 4 MG PO TBDP
4.0000 mg | ORAL_TABLET | Freq: Four times a day (QID) | ORAL | Status: DC | PRN
Start: 2014-09-10 — End: 2014-09-13

## 2014-09-10 MED ORDER — ONDANSETRON HCL 4 MG PO TABS: 4.0000 mg | ORAL_TABLET | Freq: Three times a day (TID) | ORAL | Status: DC | PRN

## 2014-09-10 MED ORDER — THIAMINE HCL 100 MG/ML IJ SOLN: 100.0000 mg | Freq: Once | INTRAMUSCULAR | Status: DC

## 2014-09-10 MED ORDER — IBUPROFEN 800 MG PO TABS
800.0000 mg | ORAL_TABLET | Freq: Three times a day (TID) | ORAL | Status: DC | PRN
  Administered 2014-09-11: 800 mg via ORAL
  Filled 2014-09-10: qty 1

## 2014-09-10 MED ORDER — LORAZEPAM 1 MG PO TABS
1.0000 mg | ORAL_TABLET | Freq: Four times a day (QID) | ORAL | Status: AC
  Administered 2014-09-10 – 2014-09-12 (×6): 1 mg via ORAL
  Filled 2014-09-10 (×4): qty 1

## 2014-09-10 MED ORDER — LORAZEPAM 1 MG PO TABS
1.0000 mg | ORAL_TABLET | Freq: Four times a day (QID) | ORAL | Status: DC | PRN
  Filled 2014-09-10: qty 1

## 2014-09-10 MED ORDER — DIPHENHYDRAMINE HCL 25 MG PO CAPS
25.0000 mg | ORAL_CAPSULE | Freq: Four times a day (QID) | ORAL | Status: DC
  Administered 2014-09-11 – 2014-09-13 (×9): 25 mg via ORAL
  Filled 2014-09-10 (×15): qty 1

## 2014-09-10 MED ORDER — VITAMIN B-1 100 MG PO TABS
100.0000 mg | ORAL_TABLET | Freq: Every day | ORAL | Status: DC
  Administered 2014-09-10: 100 mg via ORAL
  Filled 2014-09-10: qty 1

## 2014-09-10 MED ORDER — RISPERIDONE 1 MG PO TABS
1.0000 mg | ORAL_TABLET | Freq: Every day | ORAL | Status: DC
  Administered 2014-09-10: 1 mg via ORAL
  Filled 2014-09-10: qty 1

## 2014-09-10 MED ORDER — ALUM & MAG HYDROXIDE-SIMETH 200-200-20 MG/5ML PO SUSP: 30.0000 mL | ORAL | Status: DC | PRN

## 2014-09-10 MED ORDER — LORAZEPAM 1 MG PO TABS
1.0000 mg | ORAL_TABLET | Freq: Two times a day (BID) | ORAL | Status: DC
  Filled 2014-09-10: qty 1

## 2014-09-10 MED ORDER — LORAZEPAM 1 MG PO TABS
1.0000 mg | ORAL_TABLET | Freq: Three times a day (TID) | ORAL | Status: AC
  Administered 2014-09-12 – 2014-09-13 (×3): 1 mg via ORAL
  Filled 2014-09-10 (×2): qty 1

## 2014-09-10 MED ORDER — NICOTINE 21 MG/24HR TD PT24
21.0000 mg | MEDICATED_PATCH | Freq: Every day | TRANSDERMAL | Status: DC
  Filled 2014-09-10: qty 1

## 2014-09-10 MED ORDER — TRAZODONE HCL 50 MG PO TABS
50.0000 mg | ORAL_TABLET | Freq: Every evening | ORAL | Status: DC | PRN
  Administered 2014-09-10: 50 mg via ORAL
  Filled 2014-09-10 (×4): qty 1

## 2014-09-10 MED ORDER — HYDROXYZINE HCL 25 MG PO TABS
25.0000 mg | ORAL_TABLET | Freq: Four times a day (QID) | ORAL | Status: DC | PRN
  Filled 2014-09-10: qty 20

## 2014-09-10 MED ORDER — MIRTAZAPINE 15 MG PO TBDP
15.0000 mg | ORAL_TABLET | Freq: Every day | ORAL | Status: DC
  Filled 2014-09-10: qty 1

## 2014-09-10 MED ORDER — AMLODIPINE BESYLATE 5 MG PO TABS
5.0000 mg | ORAL_TABLET | Freq: Every day | ORAL | Status: DC
  Administered 2014-09-11 – 2014-09-13 (×3): 5 mg via ORAL
  Filled 2014-09-10 (×4): qty 1

## 2014-09-10 MED ORDER — MAGNESIUM HYDROXIDE 400 MG/5ML PO SUSP: 30.0000 mL | Freq: Every day | ORAL | Status: DC | PRN

## 2014-09-10 MED ORDER — LOPERAMIDE HCL 2 MG PO CAPS: 2.0000 mg | ORAL_CAPSULE | ORAL | Status: DC | PRN

## 2014-09-10 MED ORDER — AMLODIPINE BESYLATE 5 MG PO TABS
5.0000 mg | ORAL_TABLET | Freq: Every day | ORAL | Status: DC
  Administered 2014-09-10: 5 mg via ORAL
  Filled 2014-09-10: qty 1

## 2014-09-10 MED ORDER — METOPROLOL TARTRATE 25 MG PO TABS
50.0000 mg | ORAL_TABLET | Freq: Two times a day (BID) | ORAL | Status: DC
  Administered 2014-09-10 (×2): 50 mg via ORAL
  Filled 2014-09-10 (×2): qty 2

## 2014-09-10 MED ORDER — THIAMINE HCL 100 MG/ML IJ SOLN: 100.0000 mg | Freq: Every day | INTRAMUSCULAR | Status: DC

## 2014-09-10 MED ORDER — RISPERIDONE 0.5 MG PO TABS
0.5000 mg | ORAL_TABLET | Freq: Every day | ORAL | Status: DC
  Administered 2014-09-10: 0.5 mg via ORAL
  Filled 2014-09-10 (×3): qty 1

## 2014-09-10 MED ORDER — GABAPENTIN 100 MG PO CAPS
200.0000 mg | ORAL_CAPSULE | Freq: Three times a day (TID) | ORAL | Status: DC
  Administered 2014-09-11 – 2014-09-13 (×7): 200 mg via ORAL
  Filled 2014-09-10 (×10): qty 2

## 2014-09-10 MED ORDER — DULOXETINE HCL 30 MG PO CPEP
30.0000 mg | ORAL_CAPSULE | Freq: Every day | ORAL | Status: DC
  Administered 2014-09-10: 30 mg via ORAL
  Filled 2014-09-10: qty 1

## 2014-09-10 MED ORDER — LORAZEPAM 1 MG PO TABS
0.0000 mg | ORAL_TABLET | Freq: Four times a day (QID) | ORAL | Status: DC
  Administered 2014-09-10: 4 mg via ORAL
  Filled 2014-09-10: qty 4

## 2014-09-10 MED ORDER — ADULT MULTIVITAMIN W/MINERALS CH
1.0000 | ORAL_TABLET | Freq: Every day | ORAL | Status: DC
  Administered 2014-09-11 – 2014-09-13 (×3): 1 via ORAL
  Filled 2014-09-10 (×4): qty 1

## 2014-09-10 MED ORDER — VITAMIN B-1 100 MG PO TABS
100.0000 mg | ORAL_TABLET | Freq: Every day | ORAL | Status: DC
  Administered 2014-09-11 – 2014-09-13 (×3): 100 mg via ORAL
  Filled 2014-09-10 (×4): qty 1

## 2014-09-10 MED ORDER — GABAPENTIN 100 MG PO CAPS
200.0000 mg | ORAL_CAPSULE | Freq: Three times a day (TID) | ORAL | Status: DC
  Administered 2014-09-10 (×2): 200 mg via ORAL
  Filled 2014-09-10 (×2): qty 2

## 2014-09-10 MED ORDER — METOPROLOL TARTRATE 25 MG PO TABS
25.0000 mg | ORAL_TABLET | Freq: Two times a day (BID) | ORAL | Status: DC
  Administered 2014-09-10 – 2014-09-13 (×6): 25 mg via ORAL
  Filled 2014-09-10 (×9): qty 1

## 2014-09-10 MED ORDER — DULOXETINE HCL 30 MG PO CPEP
30.0000 mg | ORAL_CAPSULE | Freq: Every day | ORAL | Status: DC
  Administered 2014-09-11 – 2014-09-13 (×3): 30 mg via ORAL
  Filled 2014-09-10 (×4): qty 1

## 2014-09-10 MED ORDER — ARIPIPRAZOLE 10 MG PO TABS
10.0000 mg | ORAL_TABLET | Freq: Every day | ORAL | Status: DC
  Administered 2014-09-11 – 2014-09-13 (×3): 10 mg via ORAL
  Filled 2014-09-10 (×4): qty 1

## 2014-09-10 MED ORDER — LORAZEPAM 1 MG PO TABS: 1.0000 mg | ORAL_TABLET | Freq: Every day | ORAL | Status: DC

## 2014-09-10 NOTE — BH Assessment (Addendum)
[facility] Assessment Progress Note  Per Corena Pilgrim, MD this pt requires psychiatric hospitalization.  Debarah Crape, RN, [facility] has assigned pt to Rm [id number].  Pt has signed Voluntary Admission and Consent for Treatment, as well as Consent to Release Information, and signed forms have been faxed to [facility].  Pt's nurse, Otho Perl, has been informed.  She agrees to send original paperwork along with pt via [facility], and to call report to [phone].  Jalene Mullet, MA Triage Specialist 09/10/2014 @ 14:58

## 2014-09-10 NOTE — Consult Note (Signed)
[place] [facility] Consult   Reason for Consult:  Suicidal Ideation  Referring Physician:  EDP  [name] MRN:  [id number] Principal Diagnosis: <principal problem not specified> Diagnosis:   Patient Active Problem List   Diagnosis Date Noted  . Suicidal ideation [R45.851]   . Tylenol overdose [T39.1X4A]   . Polysubstance abuse [F19.10]   . Overdose of acetaminophen [T39.1X4A] 08/03/2014  . Overdose by acetaminophen [T39.1X4A] 08/03/2014  . Cocaine abuse [F14.10] 04/16/2014  . Alcohol dependence with withdrawal [F10.239] 04/15/2014  . Thrombocytopenia [D69.6] 04/15/2014  . Urinary tract infection, site not specified [N39.0] 04/15/2014  . Chest pain, unspecified [R07.9] 04/15/2014  . D-dimer, elevated [R79.1] 04/15/2014  . Alcohol abuse [F10.10] 01/29/2014  . Alcohol abuse with alcohol-induced mood disorder [F10.14] 01/28/2014  . Transaminitis [R74.0] 09/24/2013  . Alcohol intoxication [F10.129] 09/22/2013  . Cocaine dependence [F14.20] 09/20/2013  . Alcohol withdrawal [F10.239] 08/02/2013  . 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 Alcohol dependence [F10.20] 03/29/2013  . Seizure [R56.9] 03/15/2013  . Syncope [R55] 01/02/2013  . Leukocytopenia, unspecified [D72.819] 01/02/2013  . Left kidney mass [N28.89] 12/24/2012  . PTSD (post-traumatic stress disorder) [F43.10] 07/06/2012  . Major depressive disorder, recurrent episode [F33.9] 06/29/2012    Class: Acute  . Peripheral vascular disease [I73.9] 01/14/2012  . SEIZURE DISORDER [R56.9] 10/03/2008  . LIVER FUNCTION TESTS, ABNORMAL [R94.5] 12/29/2007  . HYPERCHOLESTEROLEMIA [E78.0] 03/21/2007  . Essential hypertension [I10] 03/21/2007    Total Time spent with patient: 25 minutes  Subjective:   [name] is a 34 y.o. male patient admitted with reports that he is suicidal and has a history of multiple suicide attempts. Pt seen and chart reviewed by Charmaine Downs, NP. Pt continues to present  with severe depression and suicidal ideation. He has been accepted for inpatient hospitalization at [facility].   HPI:   [name] is an 34 y.o. male.  -Clinician spoke with Dr. Sharol Given about need for TTS. Patient had come in [place] earlier and was to be discharged after he sobered up. When patient was going to be discharged he said he was suicidal.  Pt has been off medication for 4 months. He is usually prescribed abilify, cymbalta & zoloft and a sleep medication. He had stopped meds because he had been drinking and chose to drink instead of take meds.   Pt is itching and scratching skin. He says when he is coming off liquor he starts itching. He says that ativan helps with this "nervous itching." He says "I'm itching from the inside not from the outside." Pt says he has been drinking a gallon of vodka daily on an on-going basis. His last drink was 02/23. 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 Patient has been using about $100 worth of cocaine daily. Patient   Pt has thoughts of suicide by "taking medications." He has had multiple attempts to kill himself in the past. Patient says he is unable to maintain safety at home. He denies any current HI or A/V hallucinations. Patient   Patient is being seen at [facility] for outpatient care for the last month. Pt was at [facility] for rehab in April of 2015. Pt wants to make sure that if he goes someplace for tx, that he gets out by Friday the 26th due to a dental visit that has been planned for months. Patient also has some court dates in March due to larceny and trespassing.  -Pt care discussed with Patriciaann Clan, PA. He  recommends that placement be found for patient. There is no room at [facility] at this time. Clinician spoke to Dr. Sharol Given. She said that patient had told her he had been at [facility] a few days ago. Patient will be seen by psychiatry during the day. Frederico Hamman had said that if they felt patient  could contract for safety then he may be discharged. At this time however placement will be sought.    HPI Elements:   Location:  Psychiatric. Quality:  Worsening. Severity:  Severe. Timing:  Constant. Duration:  Chronic. Context:  Exacerbation of underlying MDD.  Past Medical History:  Past Medical History  Diagnosis Date  . Seizures   . Hypertension   . Depression   . Pancreatitis   . Liver cirrhosis   . Coronary artery disease   . Angina   . Shortness of breath   . Headache(784.0)   . Peripheral vascular disease April 2011    Left Pop  . Hypercholesteremia   . Schizophrenia   . Bipolar 1 disorder   . Cancer of kidney dx'd 04/2013    lt nephrectomy  . Breast CA dx'd 2009    bil w/ bil masectomy and oral meds  . H/O suicide attempt 2015    overdose    Past Surgical History  Procedure Laterality Date  . Chest surgery    . Left leg surgery    . Mastectomy    . Left kidney removal    . Breast surgery     Family History:  Family History  Problem Relation Age of Onset  . Stroke Other    Social History:  History  Alcohol Use  . 0.0 oz/week    Comment: 3 bottles of wine daily     History  Drug Use Not on file    Comment: crack 3-4 times a week    History   Social History  . Marital Status: Single    Spouse Name: N/A  . Number of Children: N/A  . Years of Education: N/A   Social History Main Topics  . Smoking status: Current Every Day Smoker -- 0.10 packs/day for 4 years    Types: Cigarettes  . Smokeless tobacco: Never Used  . Alcohol Use: 0.0 oz/week     Comment: 3 bottles of wine daily  . Drug Use: Not on file     Comment: crack 3-4 times a week  . Sexual Activity: Yes   Other Topics Concern  . None   Social History Narrative   Additional Social History:    Pain Medications: None Prescriptions: Abilify 10mg  once daily Over the Counter: None History of alcohol / drug use?: Yes Withdrawal Symptoms: Blackouts, Fever / Chills, Seizures,  Sweats, Tremors, Weakness, Nausea / Vomiting, Diarrhea Onset of Seizures: Detox seizures Date of most recent seizure: 6 months ago Name of Substance 1: ETOH (liquor) 1 - Age of First Use: 34 years of age 32 - Amount (size/oz): About a gallon per day 1 - Frequency: Daily use 1 - Duration: On-going 1 - Last Use / Amount: 02/22 Around a gallon Name of Substance 2: Cocaine 2 - Age of First Use: 34 years of age 5 - Amount (size/oz): $100 once per week 2 - Frequency: Weekly use  2 - Duration: on going 2 - Last Use / Amount: Two days ago.                 Allergies:   Allergies  Allergen Reactions  . Codeine  Hives, Itching and Swelling  . Depakote [Divalproex Sodium] Other (See Comments)    "Bug out and hallucinate"  . Morphine Itching  . Penicillins Swelling  . Hydrocodone-Acetaminophen Itching and Rash    Vitals: Blood pressure 132/97, pulse 94, temperature 98.1 F (36.7 C), temperature source Oral, resp. rate 16, SpO2 98 %.  Risk to Self: Suicidal Ideation: Yes-Currently Present Suicidal Intent: Yes-Currently Present Is patient at risk for suicide?: Yes Suicidal Plan?: Yes-Currently Present Specify Current Suicidal Plan: "Take the medication." Access to Means: Yes Specify Access to Suicidal Means: Medications What has been your use of drugs/alcohol within the last 12 months?: ETOH & cocaine use How many times?:  (Multiple) Other Self Harm Risks: None Triggers for Past Attempts: Unpredictable ("life") Intentional Self Injurious Behavior: None Risk to Others: Homicidal Ideation: No Thoughts of Harm to Others: No Current Homicidal Intent: No Current Homicidal Plan: No Access to Homicidal Means: No Identified Victim: No one History of harm to others?: No Assessment of Violence: None Noted Violent Behavior Description: Pt denies  Does patient have access to weapons?: No Criminal Charges Pending?: Yes Describe Pending Criminal Charges: Larceny Does patient have a  court date: Yes Court Date:  (Pt says "next month") Prior Inpatient Therapy: Prior Inpatient Therapy: Yes Prior Therapy Dates: Apri. of last year Prior Therapy Facilty/Provider(s): [facility] Reason for Treatment: rehab Prior Outpatient Therapy: Prior Outpatient Therapy: Yes Prior Therapy Dates: For the last month to current Prior Therapy Facilty/Provider(s): [facility] Reason for Treatment: Med monitoring & counsdeling  Current Facility-Administered Medications  Medication Dose Route Frequency Provider Last Rate Last Dose  . amLODipine (NORVASC) tablet 5 mg  5 mg Oral Daily Kalman Drape, MD   5 mg at 09/10/14 1100  . DULoxetine (CYMBALTA) DR capsule 30 mg  30 mg Oral Daily Kalman Drape, MD   30 mg at 09/10/14 1100  . gabapentin (NEURONTIN) capsule 200 mg  200 mg Oral TID Kalman Drape, MD   200 mg at 09/10/14 1059  . LORazepam (ATIVAN) tablet 0-4 mg  0-4 mg Oral 4 times per day Kalman Drape, MD   4 mg at 09/10/14 0436   Followed by  . [START ON 09/12/2014] LORazepam (ATIVAN) tablet 0-4 mg  0-4 mg Oral Q12H Kalman Drape, MD      . metoprolol tartrate (LOPRESSOR) tablet 50 mg  50 mg Oral BID Kalman Drape, MD   50 mg at 09/10/14 1100  . mirtazapine (REMERON SOL-TAB) disintegrating tablet 15 mg  15 mg Oral QHS Kalman Drape, MD      . nicotine (NICODERM CQ - dosed in mg/24 hours) patch 21 mg  21 mg Transdermal Daily Kalman Drape, MD   21 mg at 09/10/14 1100  . ondansetron (ZOFRAN) tablet 4 mg  4 mg Oral Q8H PRN Kalman Drape, MD      . risperiDONE (RISPERDAL) tablet 1 mg  1 mg Oral Daily Kalman Drape, MD   1 mg at 09/10/14 1100  . thiamine (VITAMIN B-1) tablet 100 mg  100 mg Oral Daily Kalman Drape, MD   100 mg at 09/10/14 1100   Or  . thiamine (B-1) injection 100 mg  100 mg Intravenous Daily Kalman Drape, MD       Current Outpatient Prescriptions  Medication Sig Dispense Refill  . amLODipine (NORVASC) 5 MG tablet Take 1 tablet (5 mg total) by mouth daily. 30 tablet 0  .  ARIPiprazole (ABILIFY) 10  MG tablet Take 10 mg by mouth daily.    . DULoxetine (CYMBALTA) 30 MG capsule Take 1 capsule (30 mg total) by mouth daily. 14 capsule 0  . gabapentin (NEURONTIN) 100 MG capsule Take 2 capsules (200 mg total) by mouth 3 (three) times daily. 80 capsule 0  . zolpidem (AMBIEN) 10 MG tablet Take 10 mg by mouth at bedtime.    . clindamycin (CLEOCIN) 150 MG capsule Take 1 capsule (150 mg total) by mouth every 6 (six) hours. (Patient not taking: Reported on 08/29/2014) 28 capsule 0  . diphenhydrAMINE (BENADRYL) 25 MG tablet Take 1 tablet (25 mg total) by mouth every 6 (six) hours. 20 tablet 0  . HYDROcodone-acetaminophen (NORCO/VICODIN) 5-325 MG per tablet Take 1 tablet by mouth every 4 (four) hours as needed for moderate pain or severe pain (pain).    . hydrOXYzine (ATARAX/VISTARIL) 25 MG tablet Take 1 tablet (25 mg total) by mouth every 6 (six) hours as needed for itching, nausea or vomiting. 30 tablet 0  . ibuprofen (ADVIL,MOTRIN) 800 MG tablet Take 1 tablet (800 mg total) by mouth every 8 (eight) hours as needed for moderate pain. (Patient not taking: Reported on 08/29/2014) 21 tablet 0  . metoprolol (LOPRESSOR) 50 MG tablet Take 1 tablet (50 mg total) by mouth 2 (two) times daily. 60 tablet 0  . mirtazapine (REMERON SOL-TAB) 15 MG disintegrating tablet Take 1 tablet (15 mg total) by mouth at bedtime. 14 tablet 0  . nicotine (NICODERM CQ - DOSED IN MG/24 HOURS) 14 mg/24hr patch Place 1 patch (14 mg total) onto the skin daily. (Patient not taking: Reported on 08/29/2014) 28 patch 0  . ondansetron (ZOFRAN) 4 MG tablet Take 1 tablet (4 mg total) by mouth every 6 (six) hours as needed for nausea. (Patient not taking: Reported on 08/03/2014) 20 tablet 0  . predniSONE (DELTASONE) 20 MG tablet 2 tabs po daily x 3 days 6 tablet 0  . risperiDONE (RISPERDAL) 1 MG tablet Take 1 tablet (1 mg total) by mouth daily. 14 tablet 0  . SUMAtriptan (IMITREX) 25 MG tablet Take 1 tablet (25 mg total) by  mouth every 2 (two) hours as needed for migraine or headache. May repeat in 2 hours if headache persists or recurs. (Patient not taking: Reported on 08/03/2014) 10 tablet 0    Musculoskeletal: Strength & Muscle Tone: within normal limits Gait & Station: normal Patient leans: N/A  Psychiatric Specialty Exam:     Blood pressure 132/97, pulse 94, temperature 98.1 F (36.7 C), temperature source Oral, resp. rate 16, SpO2 98 %.There is no weight on file to calculate BMI.  General Appearance: Casual and Fairly Groomed  Engineer, water::  Good  Speech:  Clear and Coherent and Normal Rate  Volume:  Normal  Mood:  Depressed  Affect:  Depressed  Thought Process:  Coherent  Orientation:  Full (Time, Place, and Person)  Thought Content:  Rumination  Suicidal Thoughts:  Yes.  with intent/plan  Homicidal Thoughts:  No  Memory:  Immediate;   Fair Recent;   Fair Remote;   Fair  Judgement:  Fair  Insight:  Fair  Psychomotor Activity:  Normal  Concentration:  Fair  Recall:  [employer] of Knowledge:Good  Language: Good  Akathisia:  No  Handed:    AIMS (if indicated):     Assets:  Communication Skills Desire for Improvement Resilience  ADL's:  Intact  Cognition: WNL  Sleep:      Medical Decision Making: Review of Psycho-Social  Stressors (1), Review or order clinical lab tests (1) and Established Problem, Worsening (2)  Treatment Plan Summary: See below  Plan:  Recommend psychiatric Inpatient admission when medically cleared. Disposition: Admit to inpatient; pt accepted at [facility] for 500 hall.   Benjamine Mola, FNP-BC 09/10/2014 4:05 PM    Patient seen face-to-face for psychiatric evaluation, chart reviewed and case discussed with the physician extender and developed treatment plan. Reviewed the information documented and agree with the treatment plan. Corena Pilgrim, MD

## 2014-09-10 NOTE — ED Notes (Signed)
Patient awake, reports he is having suicidal ideation.  Says, "I probably need to go to [facility]."  Dr. Sharol Given made aware.

## 2014-09-10 NOTE — Tx Team (Signed)
Initial Interdisciplinary Treatment Plan   PATIENT STRESSORS: Legal issue Loss of boyfriend in April of 2014 Substance abuse   PATIENT STRENGTHS: Ability for insight Active sense of humor Average or above average intelligence Capable of independent living Communication skills General fund of knowledge Motivation for treatment/growth Physical Health Religious Affiliation Special hobby/interest Supportive family/friends Work skills   PROBLEM LIST: Problem List/Patient Goals Date to be addressed Date deferred Reason deferred Estimated date of resolution  "Some more coping skills" 09/10/14     "To get back on my medications" 09/10/14           Depression 09/10/14     Increased risk for suicide 09/10/14     Substance abuse 09/10/14                        DISCHARGE CRITERIA:  Ability to meet basic life and health needs Adequate post-discharge living arrangements Improved stabilization in mood, thinking, and/or behavior Medical problems require only outpatient monitoring Motivation to continue treatment in a less acute level of care Need for constant or close observation no longer present Reduction of life-threatening or endangering symptoms to within safe limits Safe-care adequate arrangements made Verbal commitment to aftercare and medication compliance Withdrawal symptoms are absent or subacute and managed without 24-hour nursing intervention  PRELIMINARY DISCHARGE PLAN: Attend aftercare/continuing care group Attend 12-step recovery group Participate in family therapy Return to previous living arrangement  PATIENT/FAMIILY INVOLVEMENT: This treatment plan has been presented to and reviewed with the patient, [name], and/or family member.  The patient and family have been given the opportunity to ask questions and make suggestions.  Wynonia Hazard [facility] 09/10/2014, 9:35 PM/

## 2014-09-10 NOTE — ED Provider Notes (Signed)
Care assumed from Dr. Alvino Chapel at change of shift.  Patient arrived unresponsive.  Unclear etiology.  Patient was waking at change of shift and plan was to allow to sober and discharged.  Patient has reported to nursing staff that he is suicidal.  Patient has had medical clearing labs already.  We'll get in touch with TTS further evaluation.  Patient has history of hypertension, bipolar, prior suicide attempt with overdose, polysubstance abuse  [name] Drape, MD 09/10/14 [phone]

## 2014-09-10 NOTE — ED Notes (Signed)
Pt resting at present, Pending [facility] transport, AAO x 3, no distress noted.

## 2014-09-10 NOTE — ED Notes (Signed)
Pt ambulating to bathroom, sitter at pts side

## 2014-09-10 NOTE — ED Provider Notes (Signed)
CSN: [id number]     Arrival date & time 09/09/14  1807 History   First MD Initiated Contact with Patient 09/09/14 1809     Chief Complaint  Patient presents with  . unresponsive      (Consider location/radiation/quality/duration/timing/severity/associated sxs/prior Treatment) The history is provided by the patient and a relative.   lumbar caveat due to unresponsiveness. Reportedly brought in by mother. Had picked up by mother and patient was staggering. He went back in the house and then came back to the car after about 3 minutes hit his head and collapse. He is minimally response upon arrival.  Past Medical History  Diagnosis Date  . Seizures   . Hypertension   . Depression   . Pancreatitis   . Liver cirrhosis   . Coronary artery disease   . Angina   . Shortness of breath   . Headache(784.0)   . Peripheral vascular disease April 2011    Left Pop  . Hypercholesteremia   . Schizophrenia   . Bipolar 1 disorder   . Cancer of kidney dx'd 04/2013    lt nephrectomy  . Breast CA dx'd 2009    bil w/ bil masectomy and oral meds  . H/O suicide attempt 2015    overdose   Past Surgical History  Procedure Laterality Date  . Chest surgery    . Left leg surgery    . Mastectomy    . Left kidney removal    . Breast surgery     Family History  Problem Relation Age of Onset  . Stroke Other    History  Substance Use Topics  . Smoking status: Current Every Day Smoker -- 0.10 packs/day for 4 years    Types: Cigarettes  . Smokeless tobacco: Never Used  . Alcohol Use: 0.0 oz/week     Comment: 3 bottles of wine daily    Review of Systems  Unable to perform ROS     Allergies  Codeine; Depakote; Morphine; Penicillins; and Hydrocodone-acetaminophen  Home Medications   Prior to Admission medications   Medication Sig Start Date End Date Taking? Authorizing Provider  amLODipine (NORVASC) 5 MG tablet Take 1 tablet (5 mg total) by mouth daily. 08/06/14  Yes Marianne L York, PA-C   ARIPiprazole (ABILIFY) 2 MG tablet Take 2 mg by mouth daily.   Yes Historical Provider, MD  DULoxetine (CYMBALTA) 30 MG capsule Take 1 capsule (30 mg total) by mouth daily. 08/06/14  Yes Marianne L York, PA-C  gabapentin (NEURONTIN) 100 MG capsule Take 2 capsules (200 mg total) by mouth 3 (three) times daily. 08/06/14  Yes Marianne L York, PA-C  zolpidem (AMBIEN) 10 MG tablet Take 10 mg by mouth at bedtime.   Yes Historical Provider, MD  clindamycin (CLEOCIN) 150 MG capsule Take 1 capsule (150 mg total) by mouth every 6 (six) hours. Patient not taking: Reported on 08/29/2014 08/15/14   Domenic Moras, PA-C  diphenhydrAMINE (BENADRYL) 25 MG tablet Take 1 tablet (25 mg total) by mouth every 6 (six) hours. 08/29/14   Noland Fordyce, PA-C  HYDROcodone-acetaminophen (NORCO/VICODIN) 5-325 MG per tablet Take 1 tablet by mouth every 4 (four) hours as needed for moderate pain or severe pain (pain).    Historical Provider, MD  hydrOXYzine (ATARAX/VISTARIL) 25 MG tablet Take 1 tablet (25 mg total) by mouth every 6 (six) hours as needed for itching, nausea or vomiting. 08/06/14   Bobby Rumpf York, PA-C  ibuprofen (ADVIL,MOTRIN) 800 MG tablet Take 1 tablet (800 mg total) by  mouth every 8 (eight) hours as needed for moderate pain. Patient not taking: Reported on 08/29/2014 08/15/14   Domenic Moras, PA-C  metoprolol (LOPRESSOR) 50 MG tablet Take 1 tablet (50 mg total) by mouth 2 (two) times daily. 04/18/14   Bobby Rumpf York, PA-C  mirtazapine (REMERON SOL-TAB) 15 MG disintegrating tablet Take 1 tablet (15 mg total) by mouth at bedtime. 08/06/14   Bobby Rumpf York, PA-C  nicotine (NICODERM CQ - DOSED IN MG/24 HOURS) 14 mg/24hr patch Place 1 patch (14 mg total) onto the skin daily. Patient not taking: Reported on 08/29/2014 08/06/14   Bobby Rumpf York, PA-C  ondansetron (ZOFRAN) 4 MG tablet Take 1 tablet (4 mg total) by mouth every 6 (six) hours as needed for nausea. Patient not taking: Reported on 08/03/2014 04/18/14   Melton Alar,  PA-C  predniSONE (DELTASONE) 20 MG tablet 2 tabs po daily x 3 days 08/29/14   Noland Fordyce, PA-C  risperiDONE (RISPERDAL) 1 MG tablet Take 1 tablet (1 mg total) by mouth daily. 08/06/14   Bobby Rumpf York, PA-C  SUMAtriptan (IMITREX) 25 MG tablet Take 1 tablet (25 mg total) by mouth every 2 (two) hours as needed for migraine or headache. May repeat in 2 hours if headache persists or recurs. Patient not taking: Reported on 08/03/2014 02/06/14   Elyse Jarvis Withrow, FNP   BP 135/83 mmHg  Pulse 103  Temp(Src) 97.4 F (36.3 C) (Oral)  Resp 19  SpO2 97% Physical Exam  Constitutional: He appears well-developed.  HENT:  Head: Atraumatic.  Eyes:  Pupils mildly constricted  Neck: Neck supple.  Cardiovascular:  Tachycardia  Pulmonary/Chest:  Transmitted upper airway sounds. Scars on chest from bilateral mastectomies. Patient does not have nipples.  Abdominal: There is no tenderness.  Neurological:  Patient with weak gag reflex. Minimal response to pain. He is breathing spontaneously. Some withdrawal to pain.  Skin: Skin is warm.    ED Course  Procedures (including critical care time) Labs Review Labs Reviewed  CBC - Abnormal; Notable for the following:    WBC 3.7 (*)    Platelets 136 (*)    All other components within normal limits  COMPREHENSIVE METABOLIC PANEL - Abnormal; Notable for the following:    BUN <5 (*)    AST 139 (*)    ALT 108 (*)    All other components within normal limits  ETHANOL - Abnormal; Notable for the following:    Alcohol, Ethyl (B) 298 (*)    All other components within normal limits  URINE RAPID DRUG SCREEN (HOSP PERFORMED) - Abnormal; Notable for the following:    Cocaine POSITIVE (*)    All other components within normal limits  ACETAMINOPHEN LEVEL - Abnormal; Notable for the following:    Acetaminophen (Tylenol), Serum <10.0 (*)    All other components within normal limits  I-STAT CHEM 8, ED - Abnormal; Notable for the following:    BUN <3 (*)     Creatinine, Ser 1.40 (*)    All other components within normal limits  I-STAT CG4 LACTIC ACID, ED - Abnormal; Notable for the following:    Lactic Acid, Venous 2.37 (*)    All other components within normal limits  SALICYLATE LEVEL  CBG MONITORING, ED    Imaging Review Ct Head Wo Contrast  09/09/2014   CLINICAL DATA:  Altered mental status, head injury and subsequent unresponsiveness. History of seizures, bipolar disorder, schizophrenia, and renal cancer.  EXAM: CT HEAD WITHOUT CONTRAST  TECHNIQUE: Contiguous axial  images were obtained from the base of the skull through the vertex without intravenous contrast.  COMPARISON:  01/31/2014  FINDINGS: The cerebellum, brainstem, cerebral peduncle is, thalami, basal ganglia, ventricular system, and basilar cisterns appear normal. Several ossifications are present along the falx, as before. No intracranial hemorrhage, mass lesion, or acute CVA. The calvarium appears intact.  IMPRESSION: 1. No significant abnormality identified.   Electronically Signed   By: Van Clines M.D.   On: 09/09/2014 21:11   Dg Chest Portable 1 View  09/09/2014   CLINICAL DATA:  Unresponsive with altered mental status.  EXAM: PORTABLE CHEST - 1 VIEW  COMPARISON:  04/15/2014  FINDINGS: The heart size is normal. Lung volumes are low bilaterally. There is no evidence of pulmonary edema, consolidation, pneumothorax, nodule or pleural fluid. Visualized bony structures are unremarkable. Stable small clips in both lateral chest walls.  IMPRESSION: Low bilateral lung volumes.  No active disease.   Electronically Signed   By: Aletta Edouard M.D.   On: 09/09/2014 20:48   Dg Abd Portable 1v  09/09/2014   CLINICAL DATA:  Patient was unsteady and fell earlier today, hitting head on vehicle door. Now unresponsive. Altered mental status. Query ingestion.  EXAM: PORTABLE ABDOMEN - 1 VIEW  COMPARISON:  CT abdomen and pelvis 08/29/2014.  Abdomen 01/31/2014.  FINDINGS: Scattered gas and stool in  the colon. Scattered gas-filled small bowel. No distinct bowel distention. Changes likely due to ileus or enteritis. No radiopaque stones. Visualized bones appear intact. Examination is limited due to motion artifact.  IMPRESSION: Mild prominence of gas-filled nondistended small bowel may indicate ileus or enteritis.   Electronically Signed   By: Lucienne Capers M.D.   On: 09/09/2014 20:48     EKG Interpretation   Date/Time:  Monday September 09 2014 18:15:16 EST Ventricular Rate:  122 PR Interval:  84 QRS Duration: 86 QT Interval:  337 QTC Calculation: 480 R Axis:   61 Text Interpretation:  Sinus tachycardia Ventricular premature complex  Borderline repolarization abnormality Borderline prolonged QT interval  Confirmed by Alvino Chapel  MD, Ovid Curd [phone]) on 09/10/2014 1:16:34 AM      MDM   Final diagnoses:  Altered mental status, unspecified altered mental status type  Polysubstance abuse    Patient with altered mental status. Somewhat improved later after some time. Minimal improvement with Narcan. Alcohol level is elevated. LFTs are mildly elevated but no evidence of recurrent Tylenol overdose. Patient is asking to eat and urinate. Still somewhat sedate. Will monitor. UDS positive for cocaine.    Jasper Riling. Alvino Chapel, MD 09/10/14 [phone]

## 2014-09-10 NOTE — ED Notes (Signed)
Patient awake, stating he needs to go home because he has food on the counter.  He also says he needs to get his insurance card out of his car.  Reoriented to time, place, and situation.  Encouraged patient to get some more sleep.
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
## 2014-09-10 NOTE — ED Notes (Signed)
TTS representative speaking with patient.

## 2014-09-11 DIAGNOSIS — F1020 Alcohol dependence, uncomplicated: Secondary | ICD-10-CM

## 2014-09-11 DIAGNOSIS — F333 Major depressive disorder, recurrent, severe with psychotic symptoms: Secondary | ICD-10-CM

## 2014-09-11 DIAGNOSIS — F323 Major depressive disorder, single episode, severe with psychotic features: Secondary | ICD-10-CM | POA: Diagnosis present

## 2014-09-11 DIAGNOSIS — F1420 Cocaine dependence, uncomplicated: Secondary | ICD-10-CM

## 2014-09-11 LAB — LIPID PANEL
Cholesterol: 321 mg/dL — ABNORMAL HIGH (ref 0–200)
HDL: 77 mg/dL (ref >39)
LDL CALC: 229 mg/dL — AB (ref 0–99)
TRIGLYCERIDES: 74 mg/dL (ref <150)
Total CHOL/HDL Ratio: 4.2 RATIO
VLDL: 15 mg/dL (ref 0–40)

## 2014-09-11 LAB — TSH: TSH: 0.782 u[IU]/mL (ref 0.350–4.500)

## 2014-09-11 MED ORDER — ZOLPIDEM TARTRATE 10 MG PO TABS
10.0000 mg | ORAL_TABLET | Freq: Every evening | ORAL | Status: DC | PRN
  Administered 2014-09-11 – 2014-09-12 (×2): 10 mg via ORAL
  Filled 2014-09-11 (×2): qty 1

## 2014-09-11 NOTE — [facility] Group Notes (Signed)
[facility] LCSW Group Therapy  09/11/2014 3:51 PM  Type of Therapy:  Group Therapy  Participation Level:  Active  Participation Quality:  Attentive  Affect:  Appropriate  Cognitive:  Alert and Oriented  Insight:  Improving  Engagement in Therapy:  Engaged  Modes of Intervention:  Confrontation, Discussion, Education, Exploration, Limit-setting, Problem-solving, Rapport Building, Socialization and Support  Summary of Progress/Problems: Today's Topic: Overcoming Obstacles. Pt identified obstacles faced currently and processed barriers involved in overcoming these obstacles. Pt identified steps necessary for overcoming these obstacles and explored motivation (internal and external) for facing these difficulties head on. Pt further identified one area of concern in their lives and chose a skill of focus pulled from their "toolbox." [name] was attentive and engaged during today's processing group. He reported that his main goal was "to get back on the right medication. [facility] changes my meds and I started hallucinating. 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 [name] talked about his supports-"my mom and my sister." [name] continues to minimize substance abuse (alcohol) but reports that he is willing to attend AA and cut back on drinking. "I only drink a few beers a week but I know that might interfere with my medicine so I'm going to try to stop altogether."    [name], [name] Jeppsen LCSWA  09/11/2014, 3:51 PM

## 2014-09-11 NOTE — Tx Team (Signed)
Interdisciplinary Treatment Plan Update (Adult)   Date: 09/11/2014   Time Reviewed: 8:34 AM  Progress in Treatment:  Attending groups: no Participating in groups: no  Taking medication as prescribed: Yes  Tolerating medication: Yes  Family/Significant othe contact made: Not yet. SPE required for this pt.   Patient understands diagnosis: Yes, AEB seeking treatment for SI, ETOH detox/cocaine abuse, mood instability/depression, and for medication stabilization.  Discussing patient identified problems/goals with staff: Yes  Medical problems stabilized or resolved: Yes  Denies suicidal/homicidal ideation: Yes during self report.  Patient has not harmed self or Others: Yes  New problem(s) identified:  Discharge Plan or Barriers:  Additional comments: [name] is an 34 y.o. Male who had come in [place] earlier and was to be discharged after he sobered up. When patient was going to be discharged he said he was suicidal. Pt has been off medication for 4 months. He is usually prescribed abilify, cymbalta & zoloft and a sleep medication. He had stopped meds because he had been drinking and chose to drink instead of take meds. Pt is itching and scratching skin. He says when he is coming off liquor he starts itching. He says that ativan helps with this "nervous itching." He says "I'm itching from the inside not from the outside." Pt says he has been drinking a gallon of vodka daily on an on-going basis. His last drink was 02/23. 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 Patient has been using about $100 worth of cocaine daily.Pt has thoughts of suicide by "taking medications." He has had multiple attempts to kill himself in the past. Patient says he is unable to maintain safety at home. He denies any current HI or A/V hallucinations. Patient Patient is being seen at [facility] for outpatient care for the last month. Pt was at [facility] for rehab in April of 2015. Pt  wants to make sure that if he goes someplace for tx, that he gets out by Friday the 26th due to a dental visit that has been planned for months. Patient also has some court dates in March due to [place] and trespassing. Reason for Continuation of Hospitalization: Ativan taper-withdrawals Depression/mood instability Medication stabilization Estimated length of stay: 3-5 days -wants to d/c by Friday due to dental appt.  For review of initial/current patient goals, please see plan of care.  Attendees:  Patient:    Family:    Physician: Carlton Adam MD 09/11/2014 9:17 AM   Nursing: Ysidro Evert RN 09/11/2014 9:17 AM   Clinical Social Worker Helen, [place]  09/11/2014 9:17 AM   Other: Caryn Bee. LCSWLeana Gamer 09/11/2014 9:18 AM   Other: Gerline Legacy Nurse CM 09/11/2014 9:18 AM   Other: Hilda Lias, Community Care Coordinator  09/11/2014 9:18 AM   Other:    Scribe for Treatment Team:  Nira Conn Smart LCSWA 09/11/2014 9:18 AM

## 2014-09-11 NOTE — Progress Notes (Signed)
Patient resting in bed this morning, did not go down for breakfast. States he is catching up on his rest. Denying pain and reports his only withdrawal at this time is a slight tremor. Medicated per orders. Support and reassurance offered. Encouraged to attend and participate in programming. He verbalizes understanding. Denies SI/HI/AVH and remains safe. [name]

## 2014-09-11 NOTE — Progress Notes (Signed)
Patient ID: [name], male   DOB: 1980/12/08, 34 y.o.   MRN: [id number]   Pt was pleasant and cooperative during the adm process. Informed the writer that he was last at [place] 2 yrs ago. Stated he was sober for 8 months after discharge. Pt stated his "significant other died 2012/11/26."  Pt stated, "I called the ambulance to come get me", because he was drinking too much. Stated he called them several more times and eventually was arrested on Valentine Day for abusing 911.  Pt stated he was recommended by [facility] to come seek help with [facility]. Pt stated his alcohol consumption has decreased from a gallon liquor daily to half pt every other day. Pt also complains of having a nervous condition. Stated, "I itch and itch and itch".  Per report pt went to the ED last night impaired from etoh and cocaine. Stated pt fell and hit his head and was taken to the ED by his mother. Pt voiced no complaints to the writer at the time of adm.

## 2014-09-11 NOTE — H&P (Signed)
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 relapse prevention plan Medical Decision Making:  Review of Psycho-Social Stressors (1), Review or order clinical lab tests (1), Review of Medication Regimen & Side Effects (2) and Review of New Medication or Change in Dosage (2)  I certify that inpatient services furnished can reasonably be expected to improve the patient's condition.   Walton A 2/24/201610:27 AM
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
## 2014-09-11 NOTE — BHH Group Notes (Signed)
[facility] LCSW Aftercare Discharge Planning Group Note   09/11/2014 9:15 AM  Participation Quality:  Invited-DID NOT ATTEND/chose to stay in room to rest.   Smart, Alicia Amel

## 2014-09-11 NOTE — BHH Suicide Risk Assessment (Signed)
[place] INPATIENT:  Family/Significant Other Suicide Prevention Education  Suicide Prevention Education:  Patient Refusal for Family/Significant Other Suicide Prevention Education: The patient [name] has refused to provide written consent for family/significant other to be provided Family/Significant Other Suicide Prevention Education during admission and/or prior to discharge.  Physician notified.  SPE completed with pt. SPI pamphlet provided to pt and he was encouraged to share information with support network, ask questions, and talk about any concerns relating to SPE. Pt not endorsing SI/HI/AVH today and reports no access to weapons/firearms.   Smart, Aston Lieske LCSWA  09/11/2014, 3:14 PM

## 2014-09-11 NOTE — Progress Notes (Signed)
Adult Psychoeducational Group Note  Date:  09/11/2014 Time:  11:19 PM  Participation Level:  Active  Additional Comments:  Pt attended tonight's AA group.  He was engaged and actively participated in group.   Karie Kirks 09/11/2014, 11:19 PM

## 2014-09-11 NOTE — Progress Notes (Signed)
D: Patient alert and cooperative. Pt detoxing from [place] and c/o headache and slight tremors. Pt denies any other withdrawal symptoms.  Pt mood/affect is anxious.  Pt denies SI/HI/AVH. Pt not sure plans after disharge.No acute distressed noted at this time.   A: Medications administered as prescribed. Emotional support given and will continue to monitor pt's progress for stabilization.  R: Patient remains safe and complaint with medications. Pt attended evening AA group and engage in discussion.
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
## 2014-09-11 NOTE — Plan of Care (Signed)
Problem: Ineffective individual coping Goal: STG: Patient will remain free from self harm Outcome: Progressing Patient has not engaged in self harm and denies SI at present.  Problem: Diagnosis: Increased Risk For Suicide Attempt Goal: STG-Patient Will Comply With Medication Regime Outcome: Progressing Patient is med compliant.

## 2014-09-12 DIAGNOSIS — F1410 Cocaine abuse, uncomplicated: Secondary | ICD-10-CM

## 2014-09-12 DIAGNOSIS — G4700 Insomnia, unspecified: Secondary | ICD-10-CM

## 2014-09-12 DIAGNOSIS — F1010 Alcohol abuse, uncomplicated: Secondary | ICD-10-CM

## 2014-09-12 LAB — GABAPENTIN LEVEL: Gabapentin Lvl: <0.5 ug/mL

## 2014-09-12 MED ORDER — MIRTAZAPINE 15 MG PO TABS
15.0000 mg | ORAL_TABLET | Freq: Every day | ORAL | Status: DC
  Administered 2014-09-12: 15 mg via ORAL
  Filled 2014-09-12 (×2): qty 1

## 2014-09-12 MED ORDER — GABAPENTIN 300 MG PO CAPS
300.0000 mg | ORAL_CAPSULE | Freq: Every day | ORAL | Status: DC
  Administered 2014-09-12: 300 mg via ORAL
  Filled 2014-09-12 (×2): qty 1
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
## 2014-09-12 NOTE — [facility] Group Notes (Signed)
[place] Group Notes:  (Nursing/MHT/Case Management/Adjunct)  Date:  09/12/2014  Time:  0900  Type of Therapy:  Nurse Education  Participation Level:  Did Not Attend  Participation Quality:    Affect:    Cognitive:   Insight:    Engagement in Group:    Modes of Intervention:    Summary of Progress/Problems:Goals/ Leisure and Lifestyle changes  Franciso Bend 09/12/2014, 10:23 AM

## 2014-09-12 NOTE — Clinical Social Work Note (Signed)
Per pt request, CSW left message for pt's P.O. Waneta Martins) [phone] informing her of pt's hospitalization. Letter provided to pt with admit/disharge date to deliver to Tammy at d/c. Pt will d/c tomorrow (friday) in the AM because of  "important dentist appt at 2:00PM."   [facility], [place] 09/12/2014 11:25 AM

## 2014-09-12 NOTE — Progress Notes (Signed)
Patient ID: [name], male   DOB: [birth year], 34 y.o.   MRN: [id number]  D: Patient pleasant on approach this am. Reports mood better and put a "1" for depression, anxiety, and feelings of hopelessness. Contracts for safety on the unit. Possible discharge tomorrow. Continues on ativan protocol for alcohol detox. Very sleepy today due to benadryl and ativan. A: Staff will monitor on q 15 minute checks, follow treatment plan, and give meds as ordered. R: Cooperative on the unit.

## 2014-09-12 NOTE — [facility] Group Notes (Addendum)
[place] LCSW Group Therapy  09/12/2014 3:34 PM   shaType of Therapy: Group Therapy  Participation Level: Active  Participation Quality: Attentive and Sharing  Affect: Appropriate  Cognitive: Appropriate and Oriented  Insight: Improving and Supportive  Engagement in Therapy: Engaged  Modes of Intervention: Clarification, Discussion, Exploration and Problem-solving   Summary of Progress/Problems:  Finding Balance in Life. Today's group focused on defining balance in one's own words, identifying things that can knock one off balance, and exploring healthy ways to maintain balance in life. Group members were asked to provide an example of a time when they felt off balance, describe how they handled that situation,and process healthier ways to regain balance in the future. Group members were asked to share the most important tool for maintaining balance that they learned while at [facility] and how they plan to apply this method after discharge.   Patient actively participated in group, expressed concern for peer who is struggling w parenting decisions.  Was able to articulate that he feels shortness of breath when life becomes overwhelming and out of balance, can use this physical cue to remind himself to seek balance/reduce stress.    Edwyna Shell, LCSW

## 2014-09-12 NOTE — Progress Notes (Signed)
The patient arrived late for [place] group but was appropriate.

## 2014-09-12 NOTE — Progress Notes (Signed)
Recreation Therapy Notes  Animal-Assisted Activity/Therapy (AAA/T) Program Checklist/Progress Notes Patient Eligibility Criteria Checklist & Daily Group note for Rec Tx Intervention  Date:  02.25.2016 Time: 2:45pm Location: [address]    AAA/T Program Assumption of Risk Form signed by Patient/ or Parent Legal Guardian yes  Patient is free of allergies or sever asthma yes  Patient reports no fear of animals yes  Patient reports no history of cruelty to animals yes  Patient understands his/her participation is voluntary yes  Behavioral Response: Did not attend.    Laureen Ochs Dalylah Ramey, LRT/CTRS  Lane Hacker 09/12/2014 4:39 PM

## 2014-09-13 DIAGNOSIS — F1014 Alcohol abuse with alcohol-induced mood disorder: Secondary | ICD-10-CM

## 2014-09-13 DIAGNOSIS — F333 Major depressive disorder, recurrent, severe with psychotic symptoms: Secondary | ICD-10-CM | POA: Insufficient documentation

## 2014-09-13 LAB — HEMOGLOBIN A1C
Hgb A1c MFr Bld: 5.3 % (ref 4.8–5.6)
Mean Plasma Glucose: 105 mg/dL

## 2014-09-13 MED ORDER — GABAPENTIN 100 MG PO CAPS: 200.0000 mg | ORAL_CAPSULE | Freq: Three times a day (TID) | ORAL | Status: DC

## 2014-09-13 MED ORDER — HYDROXYZINE HCL 25 MG PO TABS: 25.0000 mg | ORAL_TABLET | Freq: Four times a day (QID) | ORAL | Status: DC | PRN

## 2014-09-13 MED ORDER — METOPROLOL TARTRATE 25 MG PO TABS: 25.0000 mg | ORAL_TABLET | Freq: Two times a day (BID) | ORAL | Status: DC

## 2014-09-13 MED ORDER — DULOXETINE HCL 30 MG PO CPEP: 30.0000 mg | ORAL_CAPSULE | Freq: Every day | ORAL | Status: DC

## 2014-09-13 MED ORDER — ZOLPIDEM TARTRATE 10 MG PO TABS: 10.0000 mg | ORAL_TABLET | Freq: Every day | ORAL | Status: DC

## 2014-09-13 MED ORDER — MIRTAZAPINE 15 MG PO TABS: 15.0000 mg | ORAL_TABLET | Freq: Every day | ORAL | Status: DC

## 2014-09-13 MED ORDER — ARIPIPRAZOLE 10 MG PO TABS: 10.0000 mg | ORAL_TABLET | Freq: Every day | ORAL | Status: DC

## 2014-09-13 MED ORDER — GABAPENTIN 300 MG PO CAPS: 300.0000 mg | ORAL_CAPSULE | Freq: Every day | ORAL | Status: DC

## 2014-09-13 MED ORDER — ADULT MULTIVITAMIN W/MINERALS CH: 1.0000 | ORAL_TABLET | Freq: Every day | ORAL | Status: DC

## 2014-09-13 MED ORDER — AMLODIPINE BESYLATE 5 MG PO TABS: 5.0000 mg | ORAL_TABLET | Freq: Every day | ORAL | Status: DC

## 2014-09-13 NOTE — Progress Notes (Signed)
Patient ID: [name], male   DOB: June 15, 1981, 34 y.o.   MRN: [id number]  D: Patient reports he feels ready for discharge. Denies any SI and minimal withdrawal symptoms. Treatment team felt patient is ready for discharge. A: Obtained all belongings, sample meds, prescriptions, and follow up appointments. R: Mother here to pick up

## 2014-09-13 NOTE — Progress Notes (Signed)
D)  Has been out on the hall this evening, went to [place] group, didn't participate but enjoyed the activity.  Has been quiet, but pleasant and cooperative.  States has dental appt tomorrow, has already had some wisdom teeth pulled, is supposed to get the others pulled tomorrow.  Asked for snack before going to bed, has been compliant with meds and with the program.   A)  Will continue to monitor for safety, continue POC R)  Safety maintained.

## 2014-09-13 NOTE — [facility] Group Notes (Signed)
Adult Psychoeducational Group Note  Date:  09/13/2014 Time:  1030  Group Topic/Focus:  Building Self Esteem:   The Focus of this group is helping patients become aware of the effects of self-esteem on their lives, the things they and others do that enhance or undermine their self-esteem, seeing the relationship between their level of self-esteem and the choices they make and learning ways to enhance self-esteem.  Participation Level:  Did Not Attend Cammy Copa 09/13/2014, 12:38 PM

## 2014-09-13 NOTE — Discharge Summary (Signed)
Physician Discharge Summary Note  Patient:  [name] is an 34 y.o., male MRN:  [id number] DOB:  Nov 26, 1980 Patient phone:  [phone] (home)  Patient address:   [place] [address],  Total Time spent with patient: 30 minutes  Date of Admission:  09/10/2014 Date of Discharge: 09/13/14  Reason for Admission:  Depression, Alcohol abuse  Principal Problem: Alcohol-induced mood disorder Discharge Diagnoses: Patient Active Problem List   Diagnosis Date Noted  . Severe major depression with psychotic features [F32.3] 09/11/2014  . 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 Suicidal ideation [R45.851]   . Tylenol overdose [T39.1X4A]   . Polysubstance abuse [F19.10]   . Overdose of acetaminophen [T39.1X4A] 08/03/2014  . Overdose by acetaminophen [T39.1X4A] 08/03/2014  . Cocaine abuse [F14.10] 04/16/2014  . Alcohol dependence with withdrawal [F10.239] 04/15/2014  . Thrombocytopenia [D69.6] 04/15/2014  . Urinary tract infection, site not specified [N39.0] 04/15/2014  . Chest pain, unspecified [R07.9] 04/15/2014  . D-dimer, elevated [R79.1] 04/15/2014  . Alcohol abuse [F10.10] 01/29/2014  . Alcohol abuse with alcohol-induced mood disorder [F10.14] 01/28/2014  . Transaminitis [R74.0] 09/24/2013  . Alcohol intoxication [F10.129] 09/22/2013  . Cocaine dependence [F14.20] 09/20/2013  . Alcohol withdrawal [F10.239] 08/02/2013  . 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 Alcohol dependence [F10.20] 03/29/2013  . Seizure [R56.9] 03/15/2013  . Syncope [R55] 01/02/2013  . Leukocytopenia, unspecified [D72.819] 01/02/2013  . Left kidney mass [N28.89] 12/24/2012  . PTSD (post-traumatic stress disorder) [F43.10] 07/06/2012  . Major depressive disorder, recurrent episode [F33.9] 06/29/2012    Class: Acute  . Peripheral vascular disease [I73.9] 01/14/2012  . SEIZURE DISORDER [R56.9] 10/03/2008  . LIVER FUNCTION TESTS, ABNORMAL [R94.5] 12/29/2007  .  HYPERCHOLESTEROLEMIA [E78.0] 03/21/2007  . Essential hypertension [I10] 03/21/2007    Musculoskeletal: Strength & Muscle Tone: within normal limits Gait & Station: normal Patient leans: N/A  Psychiatric Specialty Exam: Physical Exam  Psychiatric: He has a normal mood and affect. His speech is normal and behavior is normal. Judgment and thought content normal. Cognition and memory are normal.    Review of Systems  Constitutional: Negative.   HENT: Negative.   Eyes: Negative.   Respiratory: Negative.   Cardiovascular: Negative.   Gastrointestinal: Negative.   Genitourinary: Negative.   Musculoskeletal: Negative.   Skin: Negative.   Neurological: Negative.   Endo/Heme/Allergies: Negative.   Psychiatric/Behavioral: Positive for substance abuse (Positive for cocaine prior to admission. ). Negative for depression, suicidal ideas, hallucinations and memory loss. The patient is not nervous/anxious and does not have insomnia.     Blood pressure 146/97, pulse 101, temperature 98.9 F (37.2 C), temperature source Oral, resp. rate 16, height 5.5" (0.14 m), weight 73.483 kg (162 lb).Body mass index is 3,749.13 kg/(m^2).  See Physician SRA     Past Medical History:  Past Medical History  Diagnosis Date  . Seizures   . Hypertension   . Depression   . Pancreatitis   . Liver cirrhosis   . Coronary artery disease   . Angina   . Shortness of breath   . Headache(784.0)   . Peripheral vascular disease April 2011    Left Pop  . Hypercholesteremia   . Schizophrenia   . Bipolar 1 disorder   . Cancer of kidney dx'd 04/2013    lt nephrectomy  . Breast CA dx'd 2009    bil w/ bil masectomy and oral meds  . H/O suicide attempt 2015    overdose    Past Surgical History  Procedure Laterality  Date  . Chest surgery    . Left leg surgery    . Mastectomy    . Left kidney removal    . Breast surgery     Family History:  Family History  Problem Relation Age of Onset  . Stroke Other     Social History:  History  Alcohol Use  . 0.0 oz/week    Comment: 1/2 every other day plus two beers daily     History  Drug Use  . 1.00 per week  . Special: "Crack" cocaine, Cocaine    Comment: Last use was approximately one yr ago    History   Social History  . Marital Status: Single    Spouse Name: N/A  . Number of Children: N/A  . Years of Education: N/A   Social History Main Topics  . Smoking status: Current Every Day Smoker -- 0.25 packs/day for 10 years    Types: Cigarettes  . Smokeless tobacco: Never Used  . Alcohol Use: 0.0 oz/week     Comment: 1/2 every other day plus two beers daily  . Drug Use: 1.00 per week    Special: "Crack" cocaine, Cocaine     Comment: Last use was approximately one yr ago  . Sexual Activity: Yes     Comment: anal   Other Topics Concern  . None   Social History Narrative   Risk to Self: Is patient at risk for suicide?: Yes Risk to Others:   Prior Inpatient Therapy:   Prior Outpatient Therapy:    Level of Care:  OP  Hospital Course:  [name] is an 33 y.o. Male who has been off medication for 4 months. He is usually prescribed abilify, cymbalta & zoloft and a sleep medication. He had stopped meds because he had been drinking and chose to drink instead of take meds. Pt says he has been drinking a gallon of vodka daily on an on-going basis. His last drink was 02/23. 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 Patient has been using about $100 worth of cocaine daily. Pt has thoughts of suicide by "taking medications." He has had multiple attempts to kill himself in the past. Patient says he is unable to maintain safety at home. He denies any current HI or A/V hallucinations. Patient is being seen at [facility] for outpatient care for the last month. Pt was at [facility] for rehab in April of 2015. Pt wants to make sure that if he goes someplace for tx, that he gets out by Friday the 26th due to a  dental visit that has been planned for months. Patient also has some court dates in March due to [place] and trespassing.         [name] was admitted to the adult unit. He was evaluated and his symptoms were identified. Medication management was discussed and initiated. The patient completed an Ativan taper to safety detox him from alcohol. His Neurontin was increased to 200 mg three times daily and 300 mg at bedtime for improved mood stability. The patient was restarted on his previous medications of Abilify, Cymbalta, and Remeron that he previously stopped taking when consuming alcohol. He was oriented to the unit and encouraged to participate in unit programming. Medical problems were identified and treated appropriately. Home medication was restarted as needed.        The patient was evaluated each day by a clinical provider to ascertain the patient's response to treatment.  Improvement was noted by the patient's report of decreasing symptoms, improved sleep and appetite, affect, medication tolerance, behavior, and participation in unit programming.  He was asked each day to complete a self inventory noting mood, mental status, pain, new symptoms, anxiety and concerns.         He responded well to medication and being in a therapeutic and supportive environment. Positive and appropriate behavior was noted and the patient was motivated for recovery.  The patient worked closely with the treatment team and case manager to develop a discharge plan with appropriate goals. Coping skills, problem solving as well as relaxation therapies were also part of the unit programming.         By the day of discharge he was in much improved condition than upon admission.  Symptoms were reported as significantly decreased or resolved completely. The patient denied SI/HI and voiced no AVH. He was motivated to continue taking medication with a goal of continued improvement in mental health. [name] was discharged home  with a plan to follow up as noted below. The patient was provided with sample medications and prescriptions at time of discharge. He left [facility] in stable condition with all belongings returned to him.   Consults:  psychiatry  Significant Diagnostic Studies:  Lipid panel, Hemoglobin A1c, TSH, Chemistry panel, CBC   Discharge Vitals:   Blood pressure 146/97, pulse 101, temperature 98.9 F (37.2 C), temperature source Oral, resp. rate 16, height 5.5" (0.14 m), weight 73.483 kg (162 lb). Body mass index is 3,749.13 kg/(m^2). Lab Results:   Results for orders placed or performed during the hospital encounter of 09/10/14 (from the past 72 hour(s))  TSH     Status: None   Collection Time: 09/11/14  6:56 AM  Result Value Ref Range   TSH 0.782 0.350 - 4.500 uIU/mL    Comment: Performed at [facility]  Lipid panel, fasting     Status: Abnormal   Collection Time: 09/11/14  6:56 AM  Result Value Ref Range   Cholesterol 321 (H) 0 - 200 mg/dL    Comment:        ATP III CLASSIFICATION:  <200     mg/dL   Desirable  200-239  mg/dL   Borderline High  >=240    mg/dL   High           Triglycerides 74 <150 mg/dL   HDL 77 >39 mg/dL   Total CHOL/HDL Ratio 4.2 RATIO   VLDL 15 0 - 40 mg/dL   LDL Cholesterol 229 (H) 0 - 99 mg/dL    Comment:        Total Cholesterol/HDL:CHD Risk Coronary Heart Disease Risk Table                     Men   Women  1/2 Average Risk   3.4   3.3  Average Risk       5.0   4.4  2 X Average Risk   9.6   7.1  3 X Average Risk  23.4   11.0        Use the calculated Patient Ratio above and the CHD Risk Table to determine the patient's CHD Risk.        ATP III CLASSIFICATION (LDL):  <100     mg/dL   Optimal  100-129  mg/dL   Near or Above  Optimal  130-159  mg/dL   Borderline  160-189  mg/dL   High  >190     mg/dL   Very High Performed at [facility]   Hemoglobin A1c     Status: None   Collection Time: 09/11/14  6:56 AM  Result Value  Ref Range   Hgb A1c MFr Bld 5.3 4.8 - 5.6 %    Comment: (NOTE)         Pre-diabetes: 5.7 - 6.4         Diabetes: >6.4         Glycemic control for adults with diabetes: <7.0    Mean Plasma Glucose 105 mg/dL    Comment: (NOTE) Performed At: [facility] [place], [place] [id number] Lindon Romp MD ZD:[id number] Performed at [facility]   Gabapentin level     Status: None   Collection Time: 09/11/14  6:56 AM  Result Value Ref Range   Gabapentin Lvl <0.5 mcg/mL    Comment: (NOTE) Reference ranges for Gabapentin: 2.7-4.1 mcg/mL (peak) following a single dose of [phone] mg/day. 4.0-8.5 mcg/mL (peak) following a multiple dose of [phone] mg/day administration. The reference range is evolving.  Seizure control has been observed at levels in excess of 4 mcg/mL. Performed at [facility]     Physical Findings: AIMS: Facial and Oral Movements Muscles of Facial Expression: None, normal Lips and Perioral Area: None, normal Jaw: None, normal Tongue: None, normal,Extremity Movements Upper (arms, wrists, hands, fingers): None, normal Lower (legs, knees, ankles, toes): None, normal, Trunk Movements Neck, shoulders, hips: None, normal, Overall Severity Severity of abnormal movements (highest score from questions above): None, normal Incapacitation due to abnormal movements: None, normal [name] awareness of abnormal movements (rate only patient's report): No Awareness, Dental Status Current problems with teeth and/or dentures?: Yes Does patient usually wear dentures?: No  CIWA:  CIWA-Ar Total: 2 COWS:      See Psychiatric Specialty Exam and Suicide Risk Assessment completed by Attending Physician prior to discharge.  Discharge destination:  Home  Is patient on multiple antipsychotic therapies at discharge:  No   Has Patient had three or more failed trials of antipsychotic monotherapy by history:  No  Recommended Plan for  Multiple Antipsychotic Therapies: NA      Discharge Instructions    Discharge instructions    Complete by:  As directed   Please see your Primary Care Provider as scheduled to follow up with any chronic medical problems.            Medication List    STOP taking these medications        clindamycin 150 MG capsule  Commonly known as:  CLEOCIN     diphenhydrAMINE 25 MG tablet  Commonly known as:  BENADRYL     HYDROcodone-acetaminophen 5-325 MG per tablet  Commonly known as:  NORCO/VICODIN     mirtazapine 15 MG disintegrating tablet  Commonly known as:  REMERON SOL-TAB  Replaced by:  mirtazapine 15 MG tablet     nicotine 14 mg/24hr patch  Commonly known as:  NICODERM CQ - dosed in mg/24 hours     ondansetron 4 MG tablet  Commonly known as:  ZOFRAN     predniSONE 20 MG tablet  Commonly known as:  DELTASONE     risperiDONE 1 MG tablet  Commonly known as:  RISPERDAL     SUMAtriptan 25 MG tablet  Commonly known as:  IMITREX  TAKE these medications      Indication   amLODipine 5 MG tablet  Commonly known as:  NORVASC  Take 1 tablet (5 mg total) by mouth daily.   Indication:  High Blood Pressure     ARIPiprazole 10 MG tablet  Commonly known as:  ABILIFY  Take 1 tablet (10 mg total) by mouth daily.   Indication:  Major Depressive Disorder     DULoxetine 30 MG capsule  Commonly known as:  CYMBALTA  Take 1 capsule (30 mg total) by mouth daily.   Indication:  Major Depressive Disorder, mood stabilization     gabapentin 100 MG capsule  Commonly known as:  NEURONTIN  Take 2 capsules (200 mg total) by mouth 3 (three) times daily.   Indication:  mood stabilization     gabapentin 300 MG capsule  Commonly known as:  NEURONTIN  Take 1 capsule (300 mg total) by mouth at bedtime.   Indication:  Trouble Sleeping     hydrOXYzine 25 MG tablet  Commonly known as:  ATARAX/VISTARIL  Take 1 tablet (25 mg total) by mouth every 6 (six) hours as needed for anxiety,  itching, nausea or vomiting.   Indication:  Anxiety Neurosis, Tension     ibuprofen 800 MG tablet  Commonly known as:  ADVIL,MOTRIN  Take 1 tablet (800 mg total) by mouth every 8 (eight) hours as needed for moderate pain.      metoprolol tartrate 25 MG tablet  Commonly known as:  LOPRESSOR  Take 1 tablet (25 mg total) by mouth 2 (two) times daily.   Indication:  High Blood Pressure     mirtazapine 15 MG tablet  Commonly known as:  REMERON  Take 1 tablet (15 mg total) by mouth at bedtime.   Indication:  Trouble Sleeping, Major Depressive Disorder     multivitamin with minerals Tabs tablet  Take 1 tablet by mouth daily. May purchase over the counter to improve general health.   Indication:  Vitamin Supplementation     zolpidem 10 MG tablet  Commonly known as:  AMBIEN  Take 1 tablet (10 mg total) by mouth at bedtime.   Indication:  Trouble Sleeping       Follow-up Information    Follow up with [facility] On 10/12/2014.   Why:  Appt on this date at [facility] (verify date with [facility] at d/c). If you need to be seen sooner, please walk in between 8am-9am Monday through Friday for hospital follow-up/medication management/therapy assessment. Thanks!    Contact information:   [address] [place], [place] [address] Phone: [phone] Fax: [phone]      Follow up with [facility]  On 09/26/2014.   Why:  Appt on this date at 8:30AM with Dr. Prudencio Burly for hospital follow-up visit. (Dr. Rowe Robert has no availability for the near future)    Contact information:   ATTN: [place] Crews [address] [place], [place] [address] Phone: [phone] Fax: [phone]      Follow-up recommendations:   Activity: as tolerated Diet: regular Follow up [facility] as above  Comments:    Take all your medications as prescribed by your mental healthcare provider.  Report any adverse effects and or reactions from your medicines to your outpatient provider promptly.  Patient is  instructed and cautioned to not engage in alcohol and or illegal drug use while on prescription medicines.  In the event of worsening symptoms, patient is instructed to call the crisis hotline, 911 and or  go to the nearest ED for appropriate evaluation and treatment of symptoms.  Follow-up with your primary care provider for your other medical issues, concerns and or health care needs.   Total Discharge Time: Greater than 30 minutes   Signed: DAVIS, LAURA NP-C 09/13/2014, 11:01 AM  I personally assessed the patient and formulated the plan Geralyn Flash A. Sabra Heck, M.D.
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
## 2014-09-13 NOTE — Progress Notes (Signed)
  [facility] Adult Case Management Discharge Plan :  Will you be returning to the same living situation after discharge:  Yes,  home At discharge, do you have transportation home?: Yes,  mother coming at 11am Do you have the ability to pay for your medications: Yes,  mental health/[facility] discount  Release of information consent forms completed and submitted to Medical Records by CSW.  Patient to Follow up at: Follow-up Information    Follow up with [facility] On 10/12/2014.   Why:  Appt on this date at [facility] (verify date with [facility] at d/c). If you need to be seen sooner, please walk in between 8am-9am Monday through Friday for hospital follow-up/medication management/therapy assessment. Thanks!    Contact information:   [address], [place] [address] Phone: [phone] Fax: [phone]      Follow up with [facility]  On 09/26/2014.   Why:  Appt on this date at 8:30AM with Dr. Prudencio Burly for hospital follow-up visit. (Dr. Rowe Robert has no availability for the near future)    Contact information:   ATTN: [place] Crews [address] [place], [place] [address] Phone: [phone] Fax: [phone]      Patient denies SI/HI: Yes,  during group/self report.     Safety Planning and Suicide Prevention discussed: Yes,  SPE completed with pt. SPI pamphlet provided to pt and he was encouarged to share information with support network, ask questions, and talk about any conerns relating to SPE.  Have you used any form of tobacco in the last 30 days? (Cigarettes, Smokeless Tobacco, Cigars, and/or Pipes): Yes  Has patient been referred to the Quitline?: Patient refused referral  Smart, Alicia Amel  09/13/2014, 10:36 AM

## 2014-09-13 NOTE — [facility] Group Notes (Signed)
[facility] LCSW Aftercare Discharge Planning Group Note   09/13/2014 11:14 AM  Participation Quality:  Appropriate   Mood/Affect:  Appropriate  Depression Rating:  0  Anxiety Rating:  0  Thoughts of Suicide:  No Will you contract for safety?   NA  Current AVH:  No  Plan for Discharge/Comments:  Pt reports that he is ready to d/c and will follow-up at [facility] and with his PCP for meds. Pt to return home with mom today and has dentist appt so he must be d/ced early. NP and MD notified.   Transportation Means: mother   Supports: mother and family   Smart, Alicia Amel

## 2014-09-18 NOTE — Progress Notes (Signed)
Patient Discharge Instructions:  After Visit Summary (AVS):   Faxed to:  09/18/14 Discharge Summary Note:   Faxed to:  09/18/14 Psychiatric Admission Assessment Note:   Faxed to:  09/18/14 Suicide Risk Assessment - Discharge Assessment:   Faxed to:  09/18/14 Faxed/Sent to the Next Level Care provider:  09/18/14  Faxed to [facility] @ [phone] Faxed to [facility] @ [place], 09/18/2014, 2:04 PM

## 2014-11-19 ENCOUNTER — Encounter (HOSPITAL_COMMUNITY): Payer: Self-pay | Admitting: Emergency Medicine

## 2014-11-19 ENCOUNTER — Emergency Department (HOSPITAL_COMMUNITY)
Admission: EM | Admit: 2014-11-19 | Discharge: 2014-11-19 | Disposition: A | Discharge status: Home / Self Care | Payer: No Typology Code available for payment source | Attending: Emergency Medicine | Admitting: Emergency Medicine

## 2014-11-19 DIAGNOSIS — I2510 Atherosclerotic heart disease of native coronary artery without angina pectoris: Secondary | ICD-10-CM | POA: Insufficient documentation

## 2014-11-19 DIAGNOSIS — Z8639 Personal history of other endocrine, nutritional and metabolic disease: Secondary | ICD-10-CM | POA: Insufficient documentation

## 2014-11-19 DIAGNOSIS — Z720 Tobacco use: Secondary | ICD-10-CM | POA: Insufficient documentation

## 2014-11-19 DIAGNOSIS — I10 Essential (primary) hypertension: Secondary | ICD-10-CM | POA: Insufficient documentation

## 2014-11-19 DIAGNOSIS — Z79899 Other long term (current) drug therapy: Secondary | ICD-10-CM | POA: Insufficient documentation

## 2014-11-19 DIAGNOSIS — F1410 Cocaine abuse, uncomplicated: Secondary | ICD-10-CM | POA: Insufficient documentation

## 2014-11-19 DIAGNOSIS — Z85528 Personal history of other malignant neoplasm of kidney: Secondary | ICD-10-CM | POA: Insufficient documentation

## 2014-11-19 DIAGNOSIS — Z880 Allergy status to penicillin: Secondary | ICD-10-CM | POA: Insufficient documentation

## 2014-11-19 DIAGNOSIS — Z8719 Personal history of other diseases of the digestive system: Secondary | ICD-10-CM | POA: Insufficient documentation

## 2014-11-19 DIAGNOSIS — F319 Bipolar disorder, unspecified: Secondary | ICD-10-CM | POA: Insufficient documentation

## 2014-11-19 DIAGNOSIS — Z853 Personal history of malignant neoplasm of breast: Secondary | ICD-10-CM | POA: Insufficient documentation

## 2014-11-19 DIAGNOSIS — F1010 Alcohol abuse, uncomplicated: Secondary | ICD-10-CM

## 2014-11-19 MED ORDER — DICYCLOMINE HCL 10 MG PO CAPS
10.0000 mg | ORAL_CAPSULE | Freq: Once | ORAL | Status: AC
  Administered 2014-11-19: 10 mg via ORAL
  Filled 2014-11-19: qty 1

## 2014-11-19 MED ORDER — DICYCLOMINE HCL 10 MG PO CAPS: 10.0000 mg | ORAL_CAPSULE | Freq: Three times a day (TID) | ORAL | Status: DC

## 2014-11-19 MED ORDER — DIPHENOXYLATE-ATROPINE 2.5-0.025 MG PO TABS
1.0000 | ORAL_TABLET | Freq: Once | ORAL | Status: AC
  Administered 2014-11-19: 1 via ORAL
  Filled 2014-11-19: qty 1

## 2014-11-19 MED ORDER — NAPROXEN 500 MG PO TABS
500.0000 mg | ORAL_TABLET | Freq: Two times a day (BID) | ORAL | Status: DC
Start: 2014-11-19 — End: 2015-04-13

## 2014-11-19 NOTE — Discharge Instructions (Signed)
Continue to avoid alcohol and drugs. Take Bentyl as prescribed as needed for abdominal cramping. Take Imodium over-the-counter for diarrhea. Naproxen for body aches. Please follow-up with primary care doctor, you can try calling wellness Center as referred above for physical. Follow up with [facility] for mental evaluation. Return if any issues.   Alcohol Withdrawal Anytime drug use is interfering with normal living activities it has become abuse. This includes problems with family and friends. Psychological dependence has developed when your mind tells you that the drug is needed. This is usually followed by physical dependence when a continuing increase of drugs are required to get the same feeling or "high." This is known as addiction or chemical dependency. A person's risk is much higher if there is a history of chemical dependency in the family. Mild Withdrawal Following Stopping Alcohol, When Addiction or Chemical Dependency Has Developed When a person has developed tolerance to alcohol, any sudden stopping of alcohol can cause uncomfortable physical symptoms. Most of the time these are mild and consist of tremors in the hands and increases in heart rate, breathing, and temperature. Sometimes these symptoms are associated with anxiety, panic attacks, and bad dreams. There may also be stomach upset. Normal sleep patterns are often interrupted with periods of inability to sleep (insomnia). This may last for 6 months. Because of this discomfort, many people choose to continue drinking to get rid of this discomfort and to try to feel normal. Severe Withdrawal with Decreased or No Alcohol Intake, When Addiction or Chemical Dependency Has Developed About five percent of alcoholics will develop signs of severe withdrawal when they stop using alcohol. One sign of this is development of generalized seizures (convulsions). Other signs of this are severe agitation and confusion. This may be associated with believing  in things which are not real or seeing things which are not really there (delusions and hallucinations). Vitamin deficiencies are usually present if alcohol intake has been long-term. Treatment for this most often requires hospitalization and close observation. Addiction can only be helped by stopping use of all chemicals. This is hard but may save your life. With continual alcohol use, possible outcomes are usually loss of self respect and esteem, violence, and death. Addiction cannot be cured but it can be stopped. This often requires outside help and the care of professionals. Treatment centers are listed in the yellow pages under Cocaine, Narcotics, and Alcoholics Anonymous. Most hospitals and clinics can refer you to a specialized care center. It is not necessary for you to go through the uncomfortable symptoms of withdrawal. Your caregiver can provide you with medicines that will help you through this difficult period. Try to avoid situations, friends, or drugs that made it possible for you to keep using alcohol in the past. Learn how to say no. It takes a long period of time to overcome addictions to all drugs, including alcohol. There may be many times when you feel as though you want a drink. After getting rid of the physical addiction and withdrawal, you will have a lessening of the craving which tells you that you need alcohol to feel normal. Call your caregiver if more support is needed. Learn who to talk to in your family and among your friends so that during these periods you can receive outside help. Alcoholics Anonymous (AA) has helped many people over the years. To get further help, contact AA or call your caregiver, counselor, or clergyperson. Al-Anon and Alateen are support groups for friends and family members of an  alcoholic. The people who love and care for an alcoholic often need help, too. For information about these organizations, check your phone directory or call a local alcoholism  treatment center.  SEEK IMMEDIATE MEDICAL CARE IF:   You have a seizure.  You have a fever.  You experience uncontrolled vomiting or you vomit up blood. This may be bright red or look like black coffee grounds.  You have blood in the stool. This may be bright red or appear as a black, tarry, bad-smelling stool.  You become lightheaded or faint. Do not drive if you feel this way. Have someone else drive you or call 756 for help.  You become more agitated or confused.  You develop uncontrolled anxiety.  You begin to see things that are not really there (hallucinate). Your caregiver has determined that you completely understand your medical condition, and that your mental state is back to normal. You understand that you have been treated for alcohol withdrawal, have agreed not to drink any alcohol for a minimum of 1 day, will not operate a car or other machinery for 24 hours, and have had an opportunity to ask any questions about your condition. Document Released: 04/14/2005 Document Revised: 09/27/2011 Document Reviewed: 02/21/2008 [facility] Patient Information 2015 [place], [place]. This information is not intended to replace advice given to you by your health care provider. Make sure you discuss any questions you have with your health care provider.  Opioid Withdrawal Opioids are a group of narcotic drugs. They include the street drug heroin. They also include pain medicines, such as morphine, hydrocodone, oxycodone, and fentanyl. Opioid withdrawal is a group of characteristic physical and mental signs and symptoms. It typically occurs if you have been using opioids daily for several weeks or longer and stop using or rapidly decrease use. Opioid withdrawal can also occur if you have used opioids daily for a long time and are given a medicine to block the effect.  SIGNS AND SYMPTOMS Opioid withdrawal includes three or more of the following symptoms:   Depressed, anxious, or irritable  mood.  Nausea or vomiting.  Muscle aches or spasms.   Watery eyes.   Runny nose.  Dilated pupils, sweating, or hairs standing on end.  Diarrhea or intestinal cramping.  Yawning.   Fever.  Increased blood pressure.  Fast pulse.  Restlessness or trouble sleeping. These signs and symptoms occur within several hours of stopping or reducing short-acting opioids, such as heroin. They can occur within 3 days of stopping or reducing long-acting opioids, such as methadone. Withdrawal begins within minutes of receiving a drug that blocks the effects of opioids, such as naltrexone or naloxone. DIAGNOSIS  Opioid use disorder is diagnosed by your health care provider. You will be asked about your symptoms, drug and alcohol use, medical history, and use of medicines. A physical exam may be done. Lab tests may be ordered. Your health care provider may have you see a mental health professional.  TREATMENT  The treatment for opioid withdrawal is usually provided by medical doctors with special training in substance use disorders (addiction specialists). The following medicines may be included in treatment:  Opioids given in place of the abused opioid. They turn on opioid receptors in the brain and lessen or prevent withdrawal symptoms. They are gradually decreased (opioid substitution and taper).  Non-opioids that can lessen certain opioid withdrawal symptoms. They may be used alone or with opioid substitution and taper. Successful long-term recovery usually requires medicine, counseling, and group support. HOME  CARE INSTRUCTIONS   Take medicines only as directed by your health care provider.  Check with your health care provider before starting new medicines.  Keep all follow-up visits as directed by your health care provider. SEEK MEDICAL CARE IF:  You are not able to take your medicines as directed.  Your symptoms get worse.  You relapse. SEEK IMMEDIATE MEDICAL CARE IF:  You  have serious thoughts about hurting yourself or others.  You have a seizure.  You lose consciousness. Document Released: 07/08/2003 Document Revised: 11/19/2013 Document Reviewed: 07/18/2013 [facility] Patient Information 2015 [place], [place]. This information is not intended to replace advice given to you by your health care provider. Make sure you discuss any questions you have with your health care provider.

## 2014-11-19 NOTE — ED Notes (Addendum)
Pt was directted to ED by [facility] for Medical Clearance and Assessment prior to admission to [facility]. Pt contacted [facility] today, prior to coming to ED. Pt is alert, oriented and cooperative. Mother with patient. Pt added that he has abdominal cramping due to detox.

## 2014-11-19 NOTE — ED Provider Notes (Signed)
CSN: [id number]     Arrival date & time 11/19/14  1439 History  This chart was scribed for non-physician practitioner Jeannett Senior, PA-C working with Marye Round, MD by Hilda Lias, ED Scribe. This patient was seen in room [place] and the patient's care was started at 3:20 PM.    Chief Complaint  Patient presents with  . Drug / Alcohol Assessment    Evaluation for admission to [facility]      The history is provided by the patient. No language interpreter was used.     HPI Comments: [name] is a 34 y.o. male who presents to the Emergency Department seeking a drug and alcohol detox as well as a psychiatric evaluation to gain admission to [facility]. Pt states that he called [facility] and [facility] told him to come to ED for referral beforehand. Pt states that he seeks treatment for addiction to crack cocaine and alcohol. Last used 2 days ago. States having some abdominal cramping and diarrhea, otherwise no complaints.   Past Medical History  Diagnosis Date  . Seizures   . Hypertension   . Depression   . Pancreatitis   . Liver cirrhosis   . Coronary artery disease   . Angina   . Shortness of breath   . Headache(784.0)   . Peripheral vascular disease April 2011    Left Pop  . Hypercholesteremia   . Schizophrenia   . Bipolar 1 disorder   . Cancer of kidney dx'd 04/2013    lt nephrectomy  . Breast CA dx'd 2009    bil w/ bil masectomy and oral meds  . H/O suicide attempt 2015    overdose   Past Surgical History  Procedure Laterality Date  . Chest surgery    . Left leg surgery    . Mastectomy    . Left kidney removal    . Breast surgery     Family History  Problem Relation Age of Onset  . Stroke Other    History  Substance Use Topics  . Smoking status: Current Every Day Smoker -- 0.25 packs/day for 10 years    Types: Cigarettes  . Smokeless tobacco: Never Used  . Alcohol Use: 0.0 oz/week     Comment: 1/2 every other day plus  two beers daily    Review of Systems  Gastrointestinal: Positive for abdominal pain and diarrhea.  Neurological: Positive for headaches. Negative for tremors.      Allergies  Codeine; Depakote; Morphine; Penicillins; Oxycodone; and Hydrocodone-acetaminophen  Home Medications   Prior to Admission medications   Medication Sig Start Date End Date Taking? Authorizing Provider  amLODipine (NORVASC) 5 MG tablet Take 1 tablet (5 mg total) by mouth daily. 09/13/14   [name], NP  ARIPiprazole (ABILIFY) 10 MG tablet Take 1 tablet (10 mg total) by mouth daily. 09/13/14   [name], NP  DULoxetine (CYMBALTA) 30 MG capsule Take 1 capsule (30 mg total) by mouth daily. 09/13/14   [name], NP  gabapentin (NEURONTIN) 100 MG capsule Take 2 capsules (200 mg total) by mouth 3 (three) times daily. 09/13/14   [name], NP  gabapentin (NEURONTIN) 300 MG capsule Take 1 capsule (300 mg total) by mouth at bedtime. 09/13/14   [name], NP  hydrOXYzine (ATARAX/VISTARIL) 25 MG tablet Take 1 tablet (25 mg total) by mouth every 6 (six) hours as needed for anxiety, itching, nausea or vomiting. 09/13/14   [name], NP  ibuprofen (ADVIL,MOTRIN) 800 MG tablet Take 1 tablet (800 mg total) by mouth every 8 (eight) hours as needed for moderate pain. Patient not taking: Reported on 08/29/2014 08/15/14   Domenic Moras, PA-C  metoprolol tartrate (LOPRESSOR) 25 MG tablet Take 1 tablet (25 mg total) by mouth 2 (two) times daily. 09/13/14   [name], NP  mirtazapine (REMERON) 15 MG tablet Take 1 tablet (15 mg total) by mouth at bedtime. 09/13/14   [name], NP  Multiple Vitamin (MULTIVITAMIN WITH MINERALS) TABS tablet Take 1 tablet by mouth daily. May purchase over the counter to improve general health. 09/13/14   [name], NP  zolpidem (AMBIEN) 10 MG tablet Take 1 tablet (10 mg total) by mouth at bedtime. 09/13/14   [name], NP   There were no vitals taken for this visit. Physical Exam   Constitutional: He is oriented to person, place, and time. He appears well-developed and well-nourished.  HENT:  Head: Normocephalic and atraumatic.  Cardiovascular: Normal rate, regular rhythm, normal heart sounds and intact distal pulses.   Pulmonary/Chest: Effort normal and breath sounds normal. No respiratory distress. He has no wheezes. He has no rales.  Abdominal: He exhibits no distension.  Neurological: He is alert and oriented to person, place, and time.  No tremor  Skin: Skin is warm and dry.  Psychiatric: He has a normal mood and affect.  Nursing note and vitals reviewed.   ED Course  Procedures (including critical care time)  DIAGNOSTIC STUDIES: Oxygen Saturation is 96% on room air, normal by my interpretation.    COORDINATION OF CARE: 3:22 PM Discussed treatment plan with pt at bedside and pt agreed to plan.   Labs Review Labs Reviewed - No data to display  Imaging Review No results found.   EKG Interpretation None      MDM   Final diagnoses:  Alcohol abuse  Cocaine abuse    Patient is requesting detox from alcohol and crack cocaine. Last use 2 days ago. Patient appears to be in no acute distress. His vital signs are normal. No active withdrawal symptoms and signs noted on exam. Will discharge home, follow-up with primary care doctor and [facility] for clearance for [facility]. Explained that we no longer do evaluation for detox inpatient. Patient voiced understanding. Will be discharged home with Bentyl and naproxen.  Filed Vitals:   11/19/14 1507 11/19/14 1542  BP: 120/87   Pulse: 85 80  Temp: 98.1 F (36.7 C)   TempSrc: Oral   Resp: 18   Weight: 168 lb (76.204 kg)   SpO2: 96% 100%     I personally performed the services described in this documentation, which was scribed in my presence. The recorded information has been reviewed and is accurate.    Jeannett Senior, PA-C 11/19/14 2033  Dorie Rank, MD 11/20/14 [id number]

## 2014-11-26 ENCOUNTER — Ambulatory Visit: Payer: Self-pay

## 2014-12-07 IMAGING — CR DG ABDOMEN ACUTE W/ 1V CHEST
4 series · 4 of 4 positions shown · non-contrast
Comparison: 08/21/2010

CLINICAL DATA: Abdominal pain

ACUTE ABDOMEN SERIES (ABDOMEN 2 VIEW & CHEST 1 VIEW)

[w chest pa]
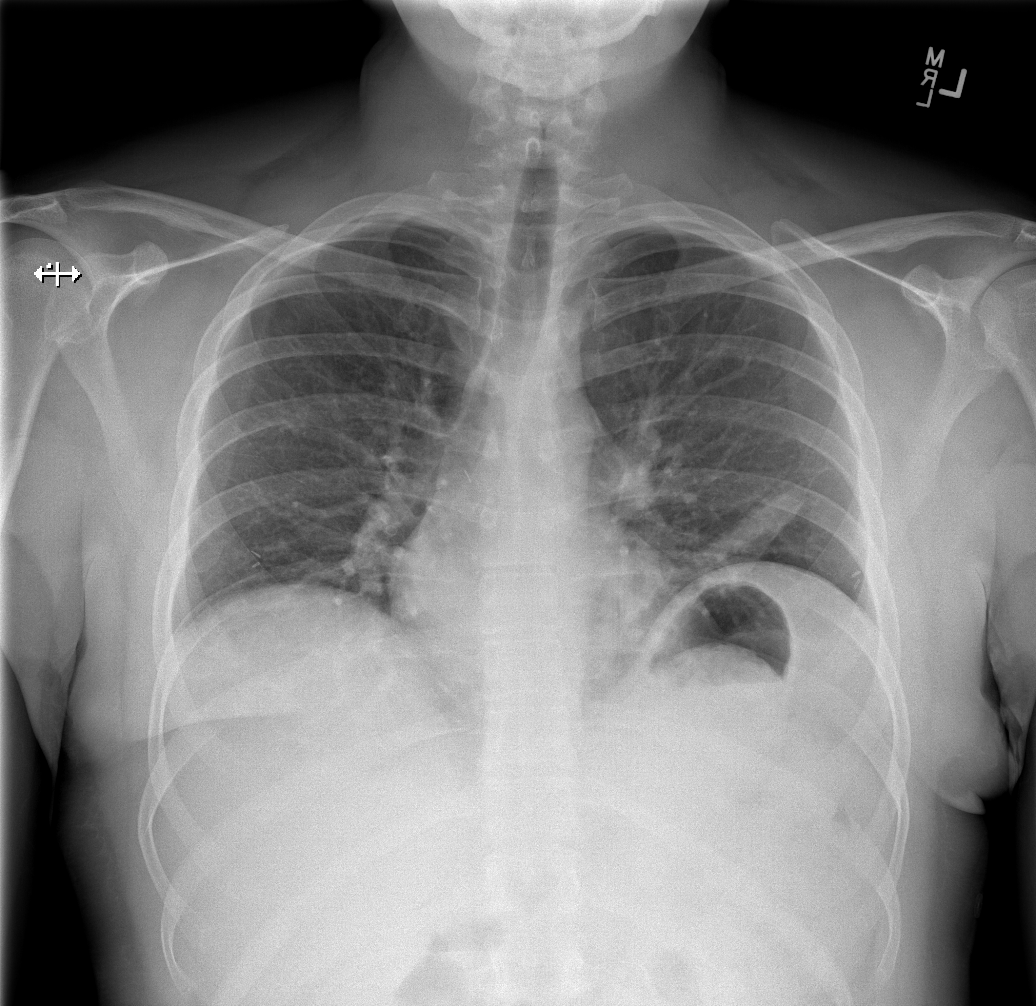

[w abdomen upright]
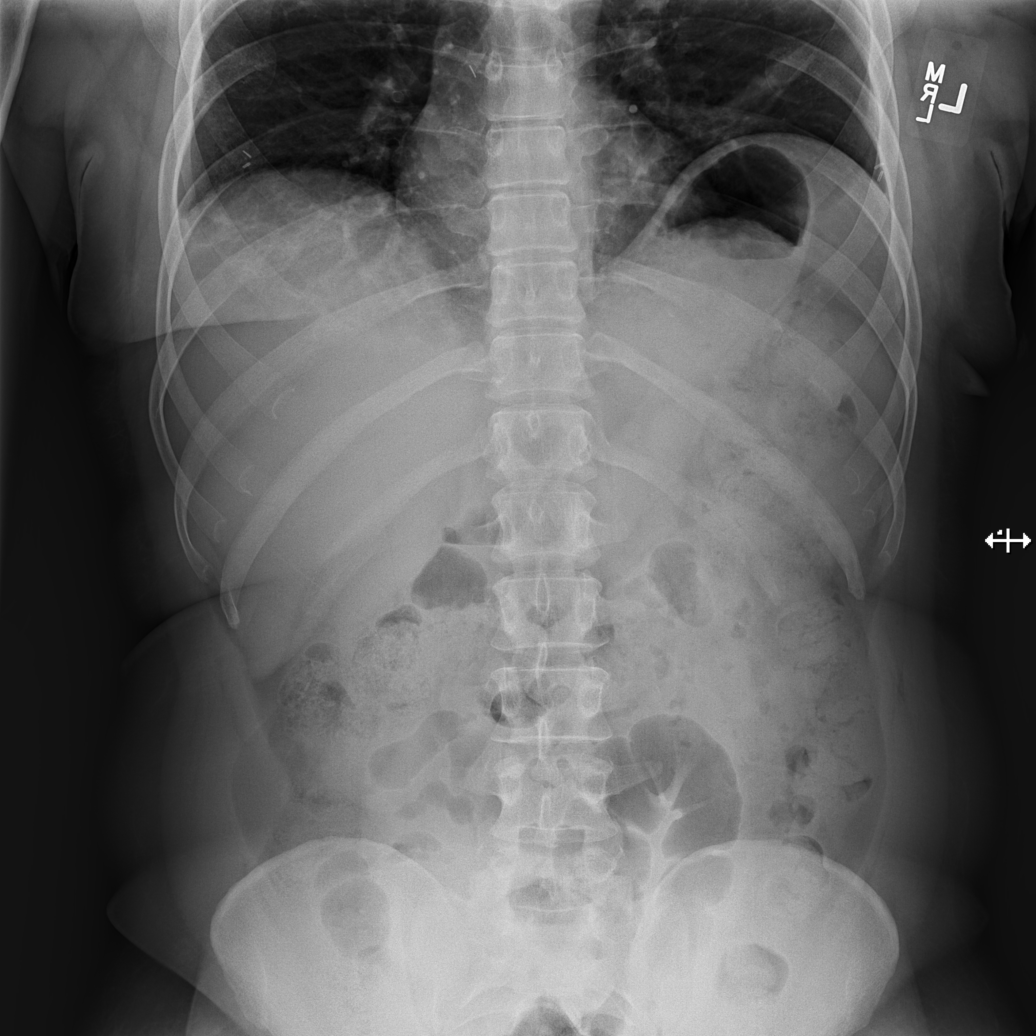

[t abdomen supine (1 of 2)]
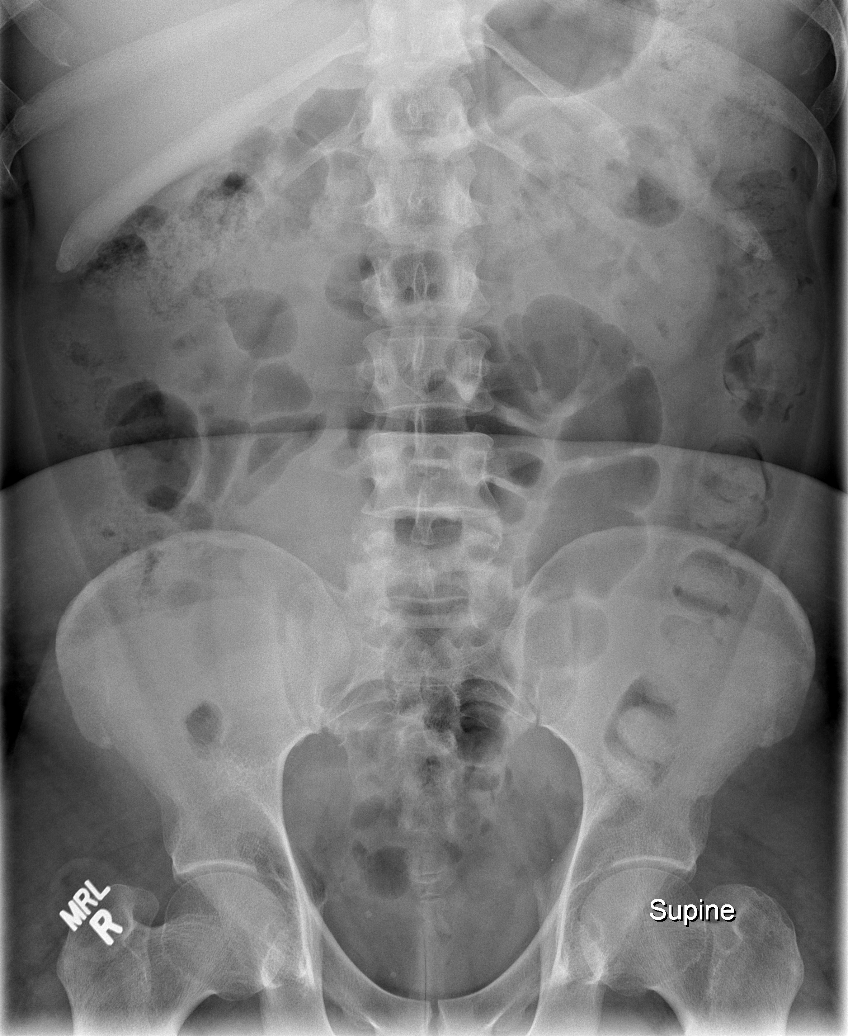

[t abdomen supine (2 of 2)]
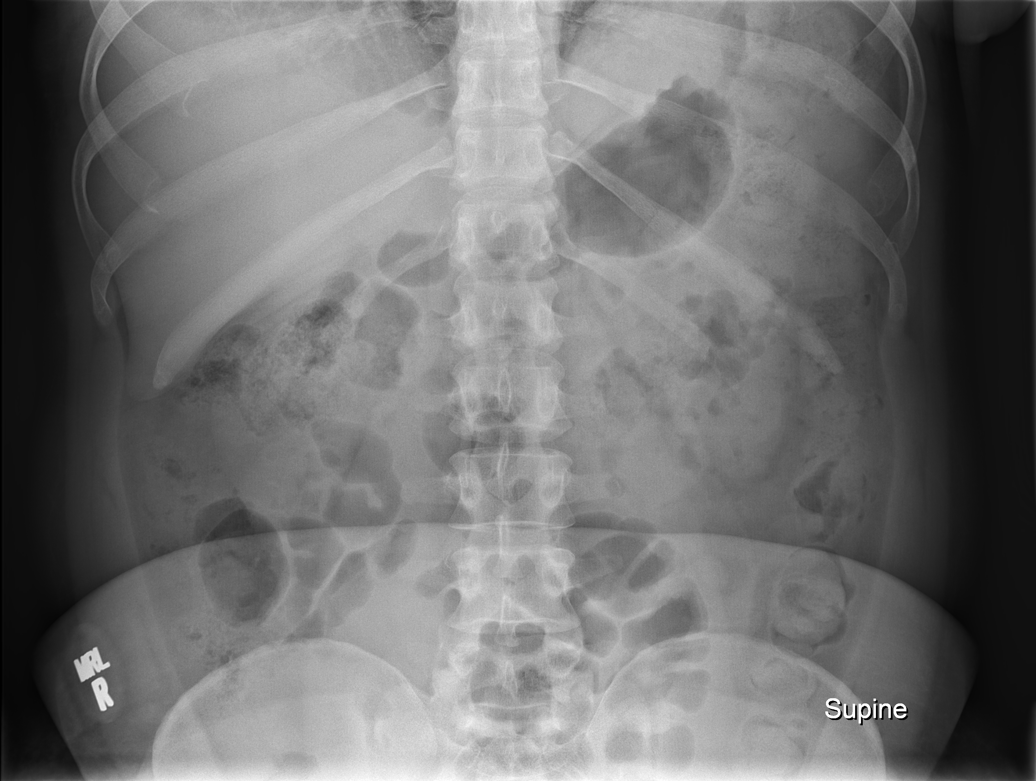

[4 of 4 positions shown; findings below may reference images not displayed]

FINDINGS: Cardiomediastinal silhouette is stable.  There is left
basilar atelectasis or infiltrate.  No pulmonary edema.
Nonspecific nonobstructive bowel gas pattern.  Moderate stool in
the right transverse and left colon.  Colonic gas noted in sigmoid
colon without significant colonic dilatation.  No free abdominal
air.
IMPRESSION: 1.  Left basilar atelectasis or early infiltrate.  No pulmonary
edema.
2.  Nonspecific nonobstructive bowel gas pattern.  Moderate colonic
stool in the right colon transverse colon and left colon.  No free
abdominal air.

## 2014-12-25 ENCOUNTER — Encounter (HOSPITAL_COMMUNITY): Payer: Self-pay | Admitting: Emergency Medicine

## 2014-12-25 ENCOUNTER — Emergency Department (HOSPITAL_COMMUNITY): Payer: Self-pay

## 2014-12-25 ENCOUNTER — Emergency Department (HOSPITAL_COMMUNITY): Payer: No Typology Code available for payment source

## 2014-12-25 ENCOUNTER — Emergency Department (HOSPITAL_COMMUNITY)
Admission: EM | Admit: 2014-12-25 | Discharge: 2014-12-25 | Disposition: A | Discharge status: Home / Self Care | Payer: Self-pay | Attending: Emergency Medicine | Admitting: Emergency Medicine

## 2014-12-25 DIAGNOSIS — Z880 Allergy status to penicillin: Secondary | ICD-10-CM | POA: Insufficient documentation

## 2014-12-25 DIAGNOSIS — Z853 Personal history of malignant neoplasm of breast: Secondary | ICD-10-CM | POA: Insufficient documentation

## 2014-12-25 DIAGNOSIS — Z8659 Personal history of other mental and behavioral disorders: Secondary | ICD-10-CM | POA: Insufficient documentation

## 2014-12-25 DIAGNOSIS — G40909 Epilepsy, unspecified, not intractable, without status epilepticus: Secondary | ICD-10-CM | POA: Insufficient documentation

## 2014-12-25 DIAGNOSIS — Z720 Tobacco use: Secondary | ICD-10-CM | POA: Insufficient documentation

## 2014-12-25 DIAGNOSIS — Z9010 Acquired absence of unspecified breast and nipple: Secondary | ICD-10-CM | POA: Insufficient documentation

## 2014-12-25 DIAGNOSIS — Z85528 Personal history of other malignant neoplasm of kidney: Secondary | ICD-10-CM | POA: Insufficient documentation

## 2014-12-25 DIAGNOSIS — I10 Essential (primary) hypertension: Secondary | ICD-10-CM | POA: Insufficient documentation

## 2014-12-25 DIAGNOSIS — H5500 Unspecified nystagmus: Secondary | ICD-10-CM | POA: Insufficient documentation

## 2014-12-25 DIAGNOSIS — Z79899 Other long term (current) drug therapy: Secondary | ICD-10-CM | POA: Insufficient documentation

## 2014-12-25 DIAGNOSIS — Z8719 Personal history of other diseases of the digestive system: Secondary | ICD-10-CM | POA: Insufficient documentation

## 2014-12-25 DIAGNOSIS — E782 Mixed hyperlipidemia: Secondary | ICD-10-CM | POA: Insufficient documentation

## 2014-12-25 DIAGNOSIS — R112 Nausea with vomiting, unspecified: Secondary | ICD-10-CM | POA: Insufficient documentation

## 2014-12-25 DIAGNOSIS — Z905 Acquired absence of kidney: Secondary | ICD-10-CM | POA: Insufficient documentation

## 2014-12-25 DIAGNOSIS — R42 Dizziness and giddiness: Secondary | ICD-10-CM | POA: Insufficient documentation

## 2014-12-25 DIAGNOSIS — I25119 Atherosclerotic heart disease of native coronary artery with unspecified angina pectoris: Secondary | ICD-10-CM | POA: Insufficient documentation

## 2014-12-25 LAB — CBC WITH DIFFERENTIAL/PLATELET
Basophils Absolute: 0 10*3/uL (ref 0.0–0.1)
Basophils Relative: 0 % (ref 0–1)
EOS ABS: 0 10*3/uL (ref 0.0–0.7)
EOS PCT: 0 % (ref 0–5)
HEMATOCRIT: 43.5 % (ref 39.0–52.0)
HEMOGLOBIN: 15.2 g/dL (ref 13.0–17.0)
LYMPHS PCT: 10 % — AB (ref 12–46)
Lymphs Abs: 0.8 10*3/uL (ref 0.7–4.0)
MCH: 30.5 pg (ref 26.0–34.0)
MCHC: 34.9 g/dL (ref 30.0–36.0)
MCV: 87.2 fL (ref 78.0–100.0)
MONO ABS: 0.3 10*3/uL (ref 0.1–1.0)
Monocytes Relative: 4 % (ref 3–12)
Neutro Abs: 7.5 10*3/uL (ref 1.7–7.7)
Neutrophils Relative %: 86 % — ABNORMAL HIGH (ref 43–77)
Platelets: 216 10*3/uL (ref 150–400)
RBC: 4.99 MIL/uL (ref 4.22–5.81)
RDW: 12.2 % (ref 11.5–15.5)
WBC: 8.7 10*3/uL (ref 4.0–10.5)

## 2014-12-25 LAB — COMPREHENSIVE METABOLIC PANEL
ALBUMIN: 4.3 g/dL (ref 3.5–5.0)
ALK PHOS: 42 U/L (ref 38–126)
ALT: 16 U/L — AB (ref 17–63)
ANION GAP: 8 (ref 5–15)
AST: 28 U/L (ref 15–41)
BILIRUBIN TOTAL: 0.5 mg/dL (ref 0.3–1.2)
BUN: 12 mg/dL (ref 6–20)
CO2: 26 mmol/L (ref 22–32)
CREATININE: 1.09 mg/dL (ref 0.61–1.24)
Calcium: 9.4 mg/dL (ref 8.9–10.3)
Chloride: 104 mmol/L (ref 101–111)
GFR calc non Af Amer: >60 mL/min (ref >60)
GLUCOSE: 128 mg/dL — AB (ref 65–99)
Potassium: 4.3 mmol/L (ref 3.5–5.1)
SODIUM: 138 mmol/L (ref 135–145)
TOTAL PROTEIN: 7.7 g/dL (ref 6.5–8.1)

## 2014-12-25 LAB — URINALYSIS, ROUTINE W REFLEX MICROSCOPIC
BILIRUBIN URINE: NEGATIVE
Glucose, UA: NEGATIVE mg/dL
Hgb urine dipstick: NEGATIVE
Ketones, ur: NEGATIVE mg/dL
Leukocytes, UA: NEGATIVE
NITRITE: NEGATIVE
PH: 8 (ref 5.0–8.0)
Protein, ur: NEGATIVE mg/dL
SPECIFIC GRAVITY, URINE: 1.017 (ref 1.005–1.030)
Urobilinogen, UA: 0.2 mg/dL (ref 0.0–1.0)

## 2014-12-25 LAB — RAPID URINE DRUG SCREEN, HOSP PERFORMED
Amphetamines: NOT DETECTED
Barbiturates: POSITIVE — AB
Benzodiazepines: NOT DETECTED
Cocaine: POSITIVE — AB
OPIATES: NOT DETECTED
Tetrahydrocannabinol: NOT DETECTED

## 2014-12-25 LAB — LIPASE, BLOOD: Lipase: 18 U/L — ABNORMAL LOW (ref 22–51)

## 2014-12-25 MED ORDER — PROCHLORPERAZINE MALEATE 10 MG PO TABS: 10.0000 mg | ORAL_TABLET | Freq: Two times a day (BID) | ORAL | Status: DC | PRN

## 2014-12-25 MED ORDER — SODIUM CHLORIDE 0.9 % IV BOLUS (SEPSIS)
1000.0000 mL | Freq: Once | INTRAVENOUS | Status: AC
  Administered 2014-12-25: 1000 mL via INTRAVENOUS

## 2014-12-25 MED ORDER — LORAZEPAM 1 MG PO TABS
1.0000 mg | ORAL_TABLET | Freq: Once | ORAL | Status: AC
  Administered 2014-12-25: 1 mg via ORAL
  Filled 2014-12-25: qty 1

## 2014-12-25 MED ORDER — DIPHENHYDRAMINE HCL 50 MG/ML IJ SOLN
25.0000 mg | Freq: Once | INTRAMUSCULAR | Status: AC
  Administered 2014-12-25: 25 mg via INTRAVENOUS
  Filled 2014-12-25: qty 1

## 2014-12-25 MED ORDER — PROCHLORPERAZINE EDISYLATE 5 MG/ML IJ SOLN
10.0000 mg | Freq: Four times a day (QID) | INTRAMUSCULAR | Status: DC | PRN
  Administered 2014-12-25: 10 mg via INTRAVENOUS
  Filled 2014-12-25: qty 2

## 2014-12-25 MED ORDER — DIPHENHYDRAMINE HCL 50 MG/ML IJ SOLN
25.0000 mg | Freq: Once | INTRAMUSCULAR | Status: DC
  Filled 2014-12-25: qty 1

## 2014-12-25 MED ORDER — PROCHLORPERAZINE EDISYLATE 5 MG/ML IJ SOLN
10.0000 mg | Freq: Once | INTRAMUSCULAR | Status: DC
  Filled 2014-12-25: qty 2

## 2014-12-25 MED ORDER — GADOBENATE DIMEGLUMINE 529 MG/ML IV SOLN
15.0000 mL | Freq: Once | INTRAVENOUS | Status: AC | PRN
  Administered 2014-12-25: 15 mL via INTRAVENOUS

## 2014-12-25 MED ORDER — KETOROLAC TROMETHAMINE 30 MG/ML IJ SOLN
30.0000 mg | Freq: Once | INTRAMUSCULAR | Status: DC
  Filled 2014-12-25: qty 1

## 2014-12-25 MED ORDER — SODIUM CHLORIDE 0.9 % IV BOLUS (SEPSIS)
1000.0000 mL | Freq: Once | INTRAVENOUS | Status: DC
  Administered 2014-12-25: 1000 mL via INTRAVENOUS

## 2014-12-25 NOTE — ED Notes (Signed)
Patient is unable to urinate this time, I gave him a urinal so he can try and he will call when he has gone

## 2014-12-25 NOTE — ED Notes (Signed)
Pt remains in MRI at this time  

## 2014-12-25 NOTE — ED Notes (Signed)
The nurse is about to start a lV on patient

## 2014-12-25 NOTE — ED Notes (Signed)
Pt made aware of need for urine specimen 

## 2014-12-25 NOTE — ED Provider Notes (Signed)
CSN: [id number]     Arrival date & time 12/25/14  3825 History   First MD Initiated Contact with Patient 12/25/14 1033     Chief Complaint  Patient presents with  . Headache  . Emesis     (Consider location/radiation/quality/duration/timing/severity/associated sxs/prior Treatment) HPI   [name] is a(n) 34 y.o. male who presents to the emergency department with chief complaint of vertigo, nausea, vomiting and loose stool. He has a past medical history of depression, bipolar disorder, schizophrenia, breast cancer, heavy alcohol abuse with liver cirrhosis, episodes of pancreatitis, peripheral vascular disease, coronary artery disease. The patient has been sober and has not had a drink in 3 months. He states that he this morning when he awoke, the room was spinning around him to the left. He had associated headache which she describes as moderate, throbbing. He became nauseous and had approximately 6 episodes of vomiting, nonbloody, nonbilious vomitus and 3 episodes of loose stool. He states that his symptoms have improved. However, he still feels the room spinning. He denies a history of vertigo. He states he feels like he "has a hangover, but I have not drank anything." He denies other changes in vision. He endorses a sensation of muffled hearing but denies tinnitus. Denies unilateral or bilateral upper extremity or lower extremity weakness, difficulty with speech or swallowing. He denies contacts with similar symptoms, fever, melena or hematochezia. He does endorse epigastric abdominal pain which he states he had several days before the symptoms began.  Past Medical History  Diagnosis Date  . Seizures   . Hypertension   . Depression   . Pancreatitis   . Liver cirrhosis   . Coronary artery disease   . Angina   . Shortness of breath   . Headache(784.0)   . Peripheral vascular disease April 2011    Left Pop  . Hypercholesteremia   . Schizophrenia   . Bipolar 1 disorder   . Cancer of  kidney dx'd 04/2013    lt nephrectomy  . Breast CA dx'd 2009    bil w/ bil masectomy and oral meds  . H/O suicide attempt 2015    overdose   Past Surgical History  Procedure Laterality Date  . Chest surgery    . Left leg surgery    . Mastectomy    . Left kidney removal    . Breast surgery     Family History  Problem Relation Age of Onset  . Stroke Other   . Cancer Other   . Hyperlipidemia Mother   . Hypertension Mother    History  Substance Use Topics  . Smoking status: Current Every Day Smoker -- 0.25 packs/day for 10 years    Types: Cigarettes  . Smokeless tobacco: Never Used  . Alcohol Use: No     Comment: former    Review of Systems Ten systems reviewed and are negative for acute change, except as noted in the HPI.     Allergies  Codeine; Depakote; Morphine; Penicillins; Oxycodone; and Hydrocodone-acetaminophen  Home Medications   Prior to Admission medications   Medication Sig Start Date End Date Taking? Authorizing Provider  amLODipine (NORVASC) 10 MG tablet Take 10 mg by mouth daily.   Yes Historical Provider, MD  ARIPiprazole (ABILIFY) 10 MG tablet Take 1 tablet (10 mg total) by mouth daily. 09/13/14  Yes [name], NP  DULoxetine (CYMBALTA) 30 MG capsule Take 1 capsule (30 mg total) by mouth daily. 09/13/14  Yes [name], NP  gabapentin (  NEURONTIN) 100 MG capsule Take 2 capsules (200 mg total) by mouth 3 (three) times daily. 09/13/14  Yes [name], NP  gabapentin (NEURONTIN) 300 MG capsule Take 1 capsule (300 mg total) by mouth at bedtime. 09/13/14  Yes [name], NP  hydrOXYzine (ATARAX/VISTARIL) 25 MG tablet Take 1 tablet (25 mg total) by mouth every 6 (six) hours as needed for anxiety, itching, nausea or vomiting. Patient taking differently: Take 25 mg by mouth 2 (two) times daily.  09/13/14  Yes [name], NP  traZODone (DESYREL) 100 MG tablet Take 100-200 mg by mouth at bedtime.   Yes Historical Provider, MD  amLODipine (NORVASC) 5 MG  tablet Take 1 tablet (5 mg total) by mouth daily. Patient not taking: Reported on 12/25/2014 09/13/14   [name], NP  dicyclomine (BENTYL) 10 MG capsule Take 1 capsule (10 mg total) by mouth 4 (four) times daily -  before meals and at bedtime. Patient not taking: Reported on 12/25/2014 11/19/14   Tatyana Kirichenko, PA-C  ibuprofen (ADVIL,MOTRIN) 800 MG tablet Take 1 tablet (800 mg total) by mouth every 8 (eight) hours as needed for moderate pain. Patient not taking: Reported on 08/29/2014 08/15/14   Domenic Moras, PA-C  metoprolol tartrate (LOPRESSOR) 25 MG tablet Take 1 tablet (25 mg total) by mouth 2 (two) times daily. Patient not taking: Reported on 12/25/2014 09/13/14   [name], NP  mirtazapine (REMERON) 15 MG tablet Take 1 tablet (15 mg total) by mouth at bedtime. Patient not taking: Reported on 12/25/2014 09/13/14   [name], NP  Multiple Vitamin (MULTIVITAMIN WITH MINERALS) TABS tablet Take 1 tablet by mouth daily. May purchase over the counter to improve general health. Patient not taking: Reported on 12/25/2014 09/13/14   [name], NP  naproxen (NAPROSYN) 500 MG tablet Take 1 tablet (500 mg total) by mouth 2 (two) times daily. Patient not taking: Reported on 12/25/2014 11/19/14   Tatyana Kirichenko, PA-C  zolpidem (AMBIEN) 10 MG tablet Take 1 tablet (10 mg total) by mouth at bedtime. Patient not taking: Reported on 12/25/2014 09/13/14   [name], NP   BP 121/79 mmHg  Pulse 74  Temp(Src) 97.5 F (36.4 C) (Oral)  Resp 16  SpO2 100% Physical Exam  Constitutional: He is oriented to person, place, and time. He appears well-developed and well-nourished. No distress.  Moon facies  HENT:  Head: Normocephalic and atraumatic.  Right Ear: External ear normal.  Left Ear: External ear normal.  Eyes: Conjunctivae are normal. Pupils are equal, round, and reactive to light. No scleral icterus.  Nystagmus > 3 beats to the left- illicits vertigo  Neck: Normal range of motion. Neck supple. No  JVD present.  Cardiovascular: Normal rate, regular rhythm and normal heart sounds.   Pulmonary/Chest: Effort normal and breath sounds normal. No respiratory distress.  Abdominal: Soft. Bowel sounds are normal. He exhibits no distension and no mass. There is no tenderness. There is no guarding.  Musculoskeletal: He exhibits no edema.  Neurological: He is alert and oriented to person, place, and time.  Speech is clear and goal oriented, follows commands Major Cranial nerves without deficit EXCEPT FOR LEFT LATERAL NYSTAGMUS, no facial droop Normal strength in upper and lower extremities bilaterally including dorsiflexion and plantar flexion, strong and equal grip strength Sensation normal to light and sharp touch Moves extremities without ataxia, coordination intact Normal finger to nose and rapid alternating movements Neg romberg, no pronator drift Normal gait Normal heel-shin  and balance   Skin: Skin is warm and dry. He is not diaphoretic.  Psychiatric: His behavior is normal.  Nursing note and vitals reviewed.  [facility]ah[facility] ED Course  Procedures (including critical care time) Labs Review Labs Reviewed  CBC WITH DIFFERENTIAL/PLATELET  COMPREHENSIVE METABOLIC PANEL  LIPASE, BLOOD  URINALYSIS, ROUTINE W REFLEX MICROSCOPIC (NOT AT [facility])  URINE RAPID DRUG SCREEN (HOSP PERFORMED) NOT AT [facility]    Imaging Review No results found.   EKG Interpretation None      MDM   Final diagnoses:  Vertigo   11:45 AM BP 121/79 mmHg  Pulse 74  Temp(Src) 97.5 F (36.4 C) (Oral)  Resp 16  SpO8 48% ] 34 year old male with an extensive and unfortunate past medical history. He is pleasant here in the emergency department. He has an abnormal finding of left lateral nystatin mass which continues to beat >3 times. He has significant risk factors for stroke. I do not see any other focal abnormalities on my examination. However, given the subtleties they can occur with posterior circulation stroke.  Therefore, we need to work him up a bit further. Patient given fluids and Ativan. We will recheck his symptoms shortly.  2:55 PM BP 108/74 mmHg  Pulse 76  Temp(Src) 97.5 F (36.4 C) (Oral)  Resp 14  SpO2 100% Patient arrival out of window for TPA admina and therefore no code stroke called.First provider contact 4 hours after arrival at ED due to patient volume. Patient states that hsi sxs worsened after admin of the Ativan. CT scan does not show any acute head bleed. I have ordered Compazine and Benadryl for symptom. Patient has had 3 moist episodes of vomiting here in the emergency department. He continues to have vertiginous symptoms. Patient will get MRI.   4:20 PM BP 120/73 mmHg  Pulse 68  Temp(Src) 97.5 F (36.4 C) (Oral)  Resp 17  SpO2 99% Patient MRI returned without acute abnormality. Patient has been given Compazine and has had complete resolution of his symptoms. He has ambulated in the emergency department to the bathroom. He appears safe for discharge at this time. Follow up with his primary care physician.  Margarita Mail, PA-C 12/25/14 1621  Fredia Sorrow, MD 12/26/14 0900

## 2014-12-25 NOTE — ED Notes (Signed)
Patient is still unable to urinate 

## 2014-12-25 NOTE — ED Notes (Signed)
Pt returned from MRI °

## 2014-12-25 NOTE — ED Notes (Signed)
Patient states he tried to urinate and was unable to go

## 2014-12-25 NOTE — Discharge Instructions (Signed)
Benign Positional Vertigo °Vertigo means you feel like you or your surroundings are moving when they are not. Benign positional vertigo is the most common form of vertigo. Benign means that the cause of your condition is not serious. Benign positional vertigo is more common in older adults. °CAUSES  °Benign positional vertigo is the result of an upset in the labyrinth system. This is an area in the middle ear that helps control your balance. This may be caused by a viral infection, head injury, or repetitive motion. However, often no specific cause is found. °SYMPTOMS  °Symptoms of benign positional vertigo occur when you move your head or eyes in different directions. Some of the symptoms may include: °· Loss of balance and falls. °· Vomiting. °· Blurred vision. °· Dizziness. °· Nausea. °· Involuntary eye movements (nystagmus). °DIAGNOSIS  °Benign positional vertigo is usually diagnosed by physical exam. If the specific cause of your benign positional vertigo is unknown, your caregiver may perform imaging tests, such as magnetic resonance imaging (MRI) or computed tomography (CT). °TREATMENT  °Your caregiver may recommend movements or procedures to correct the benign positional vertigo. Medicines such as meclizine, benzodiazepines, and medicines for nausea may be used to treat your symptoms. In rare cases, if your symptoms are caused by certain conditions that affect the inner ear, you may need surgery. °HOME CARE INSTRUCTIONS  °· Follow your caregiver's instructions. °· Move slowly. Do not make sudden body or head movements. °· Avoid driving. °· Avoid operating heavy machinery. °· Avoid performing any tasks that would be dangerous to you or others during a vertigo episode. °· Drink enough fluids to keep your urine clear or pale yellow. °SEEK IMMEDIATE MEDICAL CARE IF:  °· You develop problems with walking, weakness, numbness, or using your arms, hands, or legs. °· You have difficulty speaking. °· You develop  severe headaches. °· Your nausea or vomiting continues or gets worse. °· You develop visual changes. °· Your family or friends notice any behavioral changes. °· Your condition gets worse. °· You have a fever. °· You develop a stiff neck or sensitivity to light. °MAKE SURE YOU:  °· Understand these instructions. °· Will watch your condition. °· Will get help right away if you are not doing well or get worse. °Document Released: 04/12/2006 Document Revised: 09/27/2011 Document Reviewed: 03/25/2011 °[employer]® Patient Information ©2015 [employer]. This information is not intended to replace advice given to you by your health care provider. Make sure you discuss any questions you have with your health care provider. ° °Dizziness °Dizziness is a common problem. It is a feeling of unsteadiness or light-headedness. You may feel like you are about to faint. Dizziness can lead to injury if you stumble or fall. A person of any age group can suffer from dizziness, but dizziness is more common in older adults. °CAUSES  °Dizziness can be caused by many different things, including: °· Middle ear problems. °· Standing for too long. °· Infections. °· An allergic reaction. °· Aging. °· An emotional response to something, such as the sight of blood. °· Side effects of medicines. °· Tiredness. °· Problems with circulation or blood pressure. °· Excessive use of alcohol or medicines, or illegal drug use. °· Breathing too fast (hyperventilation). °· An irregular heart rhythm (arrhythmia). °· A low red blood cell count (anemia). °· Pregnancy. °· Vomiting, diarrhea, fever, or other illnesses that cause body fluid loss (dehydration). °· Diseases or conditions such as Parkinson's disease, high blood pressure (hypertension), diabetes, and thyroid problems. °·   Exposure to extreme heat. DIAGNOSIS  Your health care provider will ask about your symptoms, perform a physical exam, and perform an electrocardiogram (ECG) to record the electrical  activity of your heart. Your health care provider may also perform other heart or blood tests to determine the cause of your dizziness. These may include:  Transthoracic echocardiogram (TTE). During echocardiography, sound waves are used to evaluate how blood flows through your heart.  Transesophageal echocardiogram (TEE).  Cardiac monitoring. This allows your health care provider to monitor your heart rate and rhythm in real time.  Holter monitor. This is a portable device that records your heartbeat and can help diagnose heart arrhythmias. It allows your health care provider to track your heart activity for several days if needed.  Stress tests by exercise or by giving medicine that makes the heart beat faster. TREATMENT  Treatment of dizziness depends on the cause of your symptoms and can vary greatly. HOME CARE INSTRUCTIONS   Drink enough fluids to keep your urine clear or pale yellow. This is especially important in very hot weather. In older adults, it is also important in cold weather.  Take your medicine exactly as directed if your dizziness is caused by medicines. When taking blood pressure medicines, it is especially important to get up slowly.  Rise slowly from chairs and steady yourself until you feel okay.  In the morning, first sit up on the side of the bed. When you feel okay, stand slowly while holding onto something until you know your balance is fine.  Move your legs often if you need to stand in one place for a long time. Tighten and relax your muscles in your legs while standing.  Have someone stay with you for 1-2 days if dizziness continues to be a problem. Do this until you feel you are well enough to stay alone. Have the person call your health care provider if he or she notices changes in you that are concerning.  Do not drive or use heavy machinery if you feel dizzy.  Do not drink alcohol. SEEK IMMEDIATE MEDICAL CARE IF:   Your dizziness or light-headedness  gets worse.  You feel nauseous or vomit.  You have problems talking, walking, or using your arms, hands, or legs.  You feel weak.  You are not thinking clearly or you have trouble forming sentences. It may take a friend or family member to notice this.  You have chest pain, abdominal pain, shortness of breath, or sweating.  Your vision changes.  You notice any bleeding.  You have side effects from medicine that seems to be getting worse rather than better. MAKE SURE YOU:   Understand these instructions.  Will watch your condition.  Will get help right away if you are not doing well or get worse. Document Released: 12/29/2000 Document Revised: 07/10/2013 Document Reviewed: 01/22/2011 [facility] Patient Information 2015 [place], [place]. This information is not intended to replace advice given to you by your health care provider. Make sure you discuss any questions you have with your health care provider.  Nausea and Vomiting Nausea is a sick feeling that often comes before throwing up (vomiting). Vomiting is a reflex where stomach contents come out of your mouth. Vomiting can cause severe loss of body fluids (dehydration). Children and elderly adults can become dehydrated quickly, especially if they also have diarrhea. Nausea and vomiting are symptoms of a condition or disease. It is important to find the cause of your symptoms. CAUSES   Direct irritation  of the stomach lining. This irritation can result from increased acid production (gastroesophageal reflux disease), infection, food poisoning, taking certain medicines (such as nonsteroidal anti-inflammatory drugs), alcohol use, or tobacco use.  Signals from the brain.These signals could be caused by a headache, heat exposure, an inner ear disturbance, increased pressure in the brain from injury, infection, a tumor, or a concussion, pain, emotional stimulus, or metabolic problems.  An obstruction in the gastrointestinal tract (bowel  obstruction).  Illnesses such as diabetes, hepatitis, gallbladder problems, appendicitis, kidney problems, cancer, sepsis, atypical symptoms of a heart attack, or eating disorders.  Medical treatments such as chemotherapy and radiation.  Receiving medicine that makes you sleep (general anesthetic) during surgery. DIAGNOSIS Your caregiver may ask for tests to be done if the problems do not improve after a few days. Tests may also be done if symptoms are severe or if the reason for the nausea and vomiting is not clear. Tests may include:  Urine tests.  Blood tests.  Stool tests.  Cultures (to look for evidence of infection).  X-rays or other imaging studies. Test results can help your caregiver make decisions about treatment or the need for additional tests. TREATMENT You need to stay well hydrated. Drink frequently but in small amounts.You may wish to drink water, sports drinks, clear broth, or eat frozen ice pops or gelatin dessert to help stay hydrated.When you eat, eating slowly may help prevent nausea.There are also some antinausea medicines that may help prevent nausea. HOME CARE INSTRUCTIONS   Take all medicine as directed by your caregiver.  If you do not have an appetite, do not force yourself to eat. However, you must continue to drink fluids.  If you have an appetite, eat a normal diet unless your caregiver tells you differently.  Eat a variety of complex carbohydrates (rice, wheat, potatoes, bread), lean meats, yogurt, fruits, and vegetables.  Avoid high-fat foods because they are more difficult to digest.  Drink enough water and fluids to keep your urine clear or pale yellow.  If you are dehydrated, ask your caregiver for specific rehydration instructions. Signs of dehydration may include:  Severe thirst.  Dry lips and mouth.  Dizziness.  Dark urine.  Decreasing urine frequency and amount.  Confusion.  Rapid breathing or pulse. SEEK IMMEDIATE MEDICAL  CARE IF:   You have blood or brown flecks (like coffee grounds) in your vomit.  You have black or bloody stools.  You have a severe headache or stiff neck.  You are confused.  You have severe abdominal pain.  You have chest pain or trouble breathing.  You do not urinate at least once every 8 hours.  You develop cold or clammy skin.  You continue to vomit for longer than 24 to 48 hours.  You have a fever. MAKE SURE YOU:   Understand these instructions.  Will watch your condition.  Will get help right away if you are not doing well or get worse. Document Released: 07/05/2005 Document Revised: 09/27/2011 Document Reviewed: 12/02/2010 [facility] Patient Information 2015 [employer], [place]. This information is not intended to replace advice given to you by your health care provider. Make sure you discuss any questions you have with your health care provider.

## 2014-12-25 NOTE — ED Notes (Signed)
Pt states he woke up this morning around 5am feeling dizzy, with a headache, vomiting and diarrhea  Visitor states pt's blood pressure was high this morning when she checked it

## 2015-01-12 IMAGING — CR DG CHEST 2V
2 series · 2 of 2 positions shown · non-contrast
Comparison: 07/05/2012

CLINICAL DATA: Cough, fever

CHEST - 2 VIEW

[w chest pa]
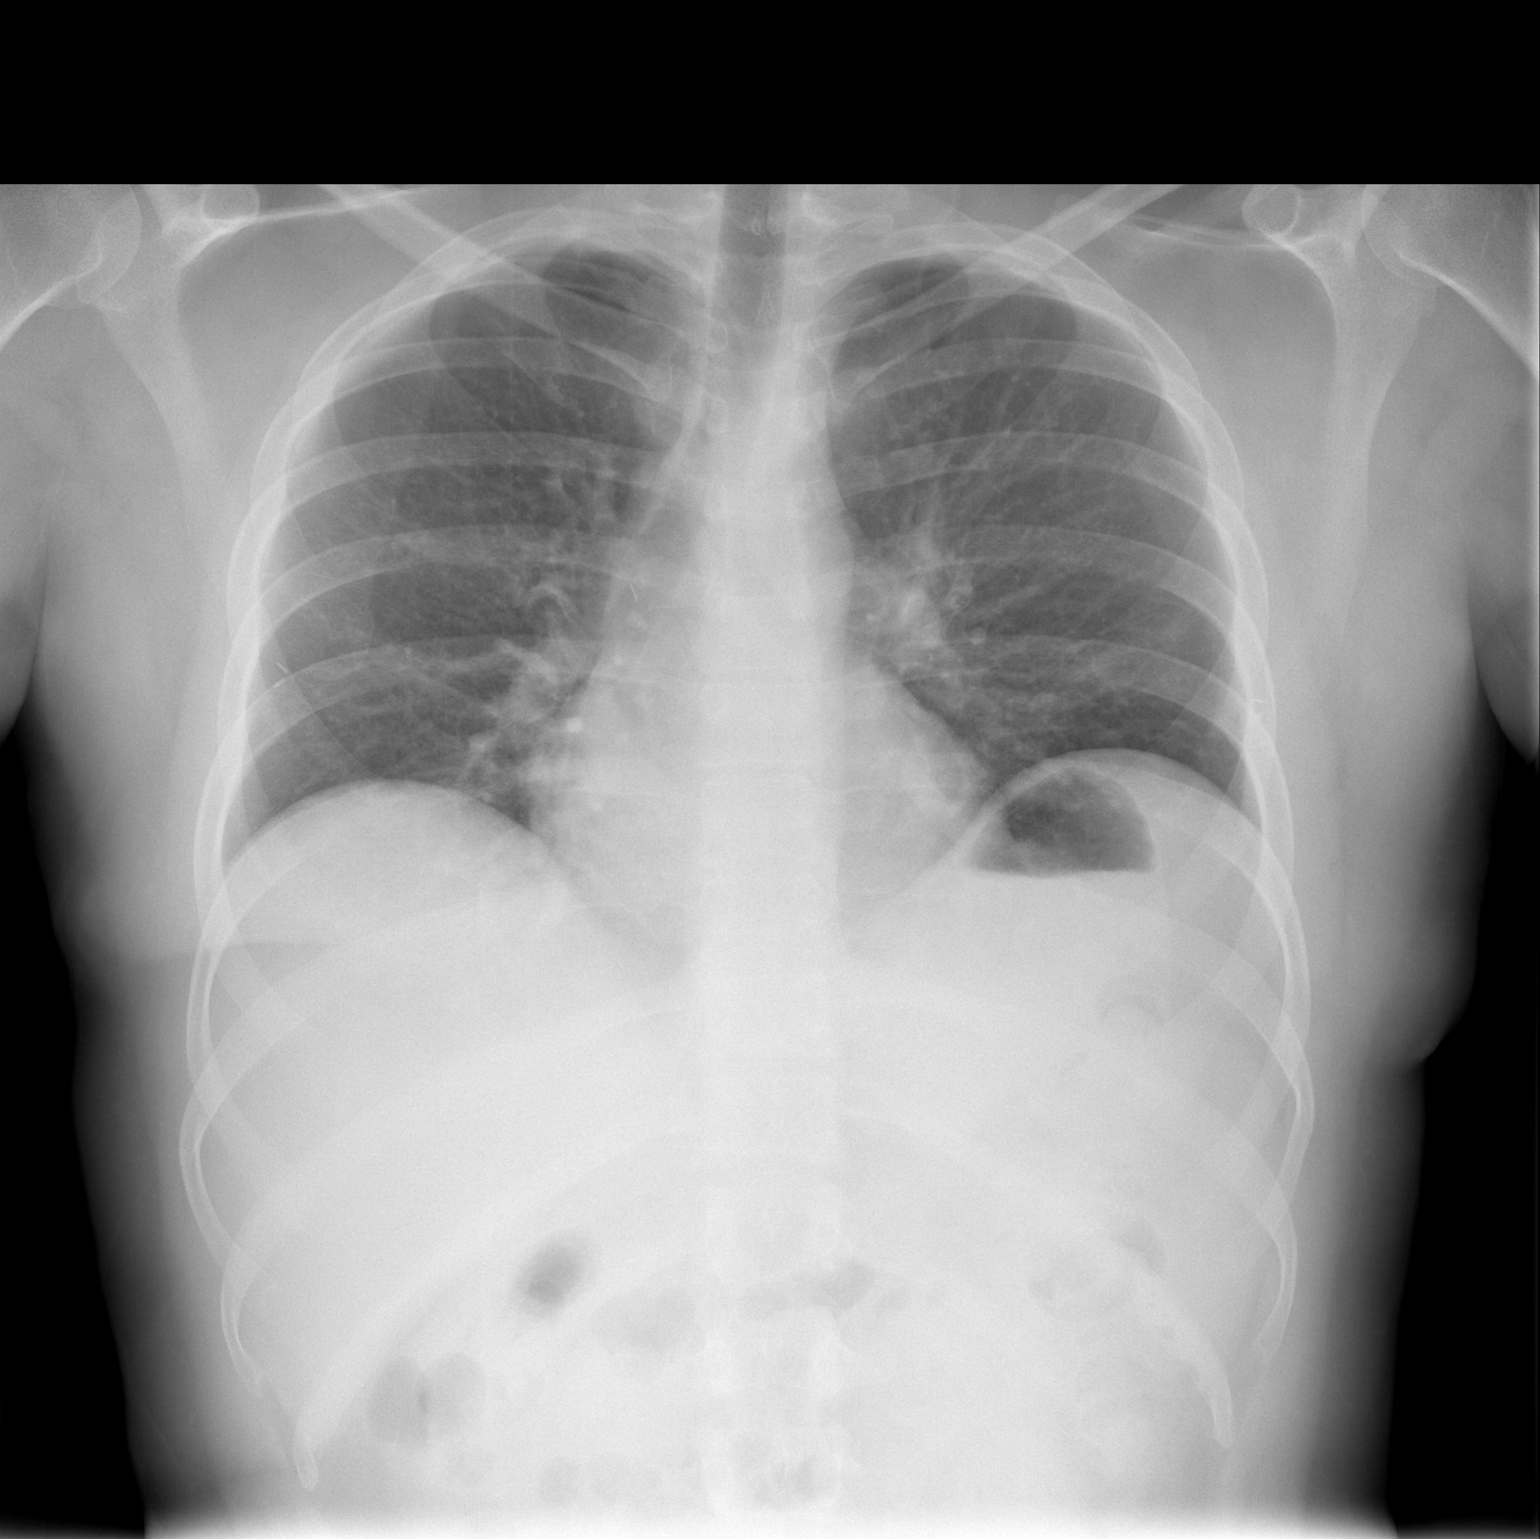

[w chest lat]
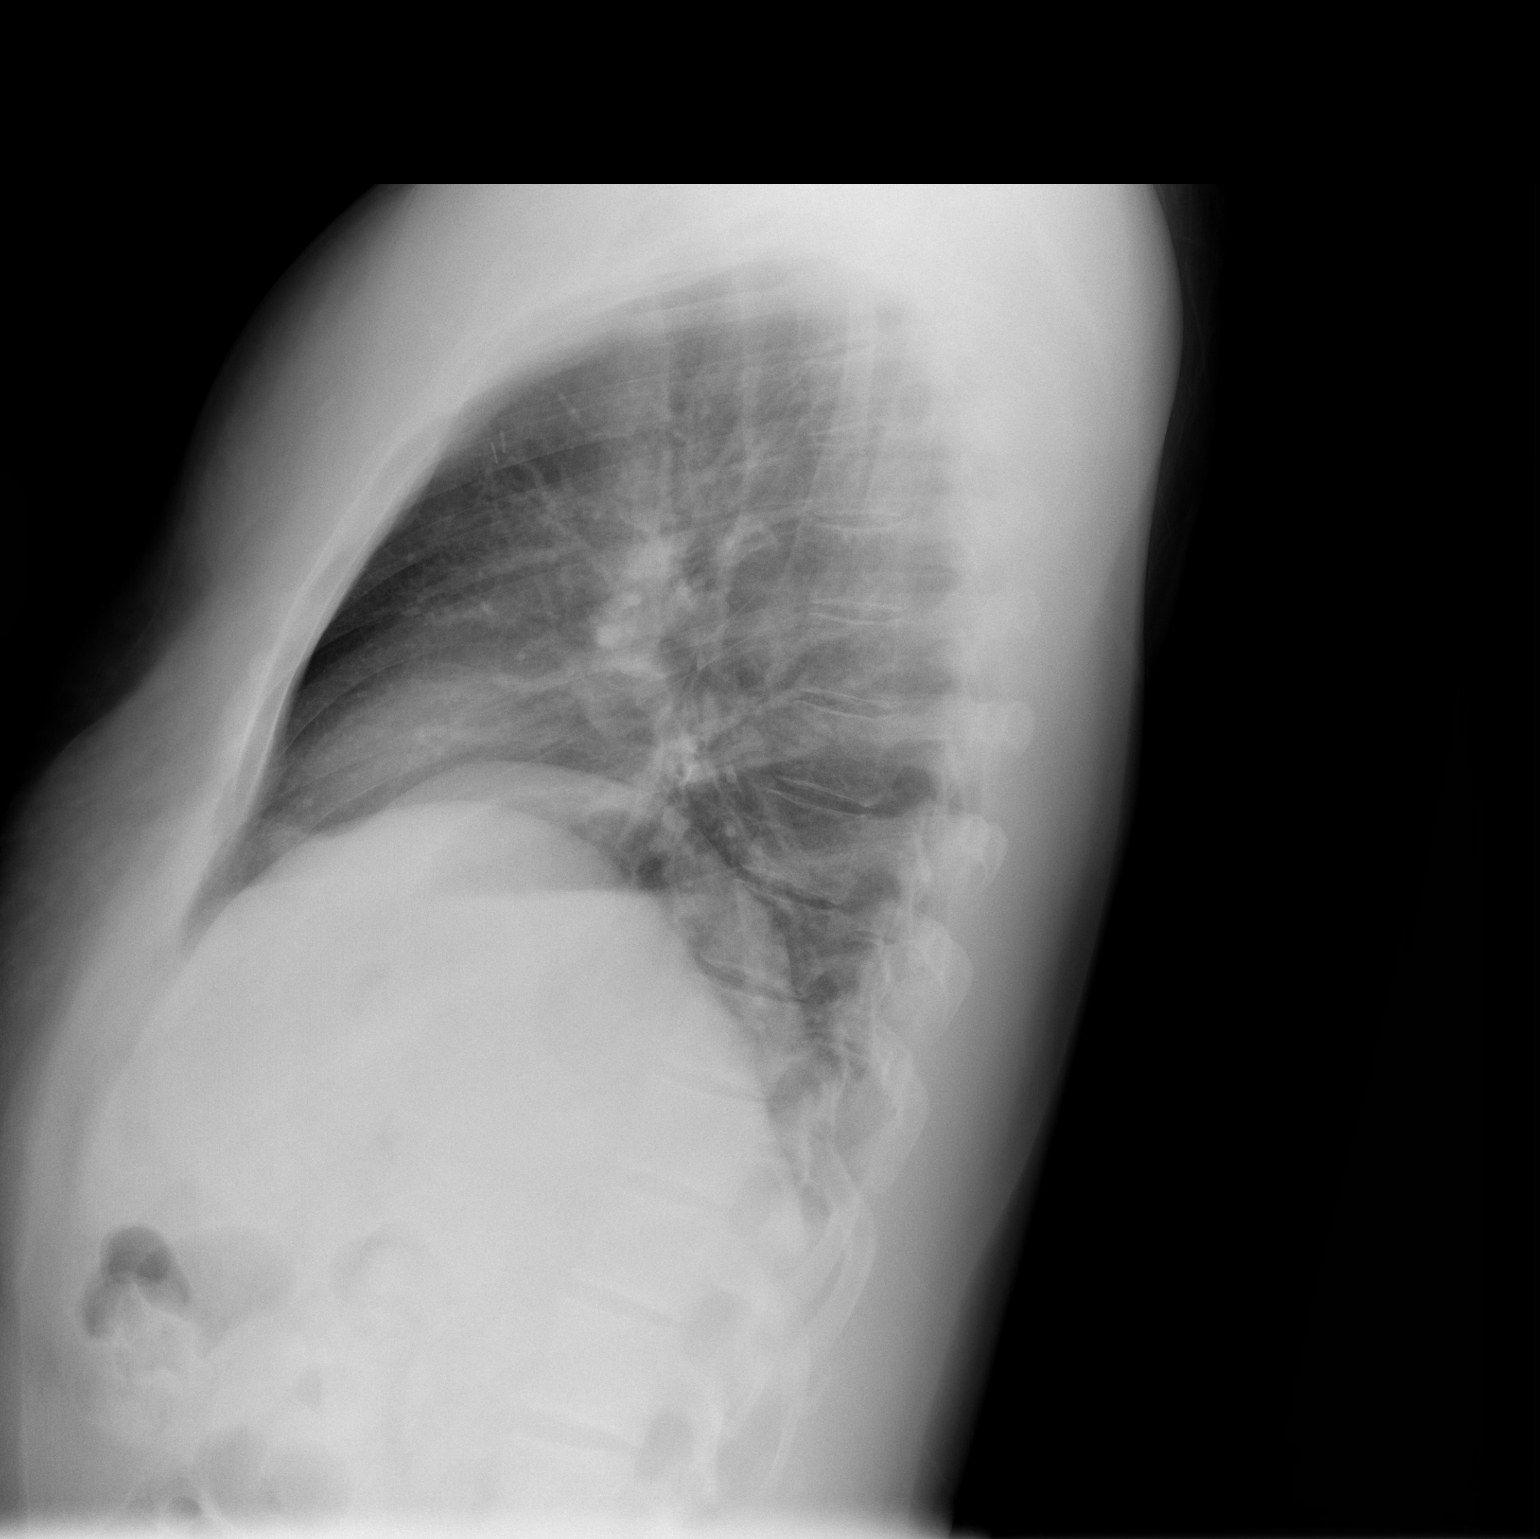

[2 of 2 positions shown; findings below may reference images not displayed]

FINDINGS: Cardiomediastinal silhouette is stable.  No acute
infiltrate or pleural effusion.  No pulmonary edema.  Bony thorax
is unremarkable. Linear atelectasis or scarring in the lingula.
IMPRESSION: No active disease.

## 2015-01-16 ENCOUNTER — Emergency Department (HOSPITAL_COMMUNITY)
Admission: EM | Admit: 2015-01-16 | Discharge: 2015-01-17 | Disposition: A | Discharge status: Home / Self Care | Payer: No Typology Code available for payment source | Attending: Emergency Medicine | Admitting: Emergency Medicine

## 2015-01-16 DIAGNOSIS — E780 Pure hypercholesterolemia: Secondary | ICD-10-CM | POA: Insufficient documentation

## 2015-01-16 DIAGNOSIS — I25119 Atherosclerotic heart disease of native coronary artery with unspecified angina pectoris: Secondary | ICD-10-CM | POA: Insufficient documentation

## 2015-01-16 DIAGNOSIS — I10 Essential (primary) hypertension: Secondary | ICD-10-CM | POA: Insufficient documentation

## 2015-01-16 DIAGNOSIS — F319 Bipolar disorder, unspecified: Secondary | ICD-10-CM | POA: Insufficient documentation

## 2015-01-16 DIAGNOSIS — R110 Nausea: Secondary | ICD-10-CM | POA: Insufficient documentation

## 2015-01-16 DIAGNOSIS — Z880 Allergy status to penicillin: Secondary | ICD-10-CM | POA: Insufficient documentation

## 2015-01-16 DIAGNOSIS — Z853 Personal history of malignant neoplasm of breast: Secondary | ICD-10-CM | POA: Insufficient documentation

## 2015-01-16 DIAGNOSIS — R5381 Other malaise: Secondary | ICD-10-CM

## 2015-01-16 DIAGNOSIS — K5900 Constipation, unspecified: Secondary | ICD-10-CM | POA: Insufficient documentation

## 2015-01-16 DIAGNOSIS — Z79899 Other long term (current) drug therapy: Secondary | ICD-10-CM | POA: Insufficient documentation

## 2015-01-16 DIAGNOSIS — Z85528 Personal history of other malignant neoplasm of kidney: Secondary | ICD-10-CM | POA: Insufficient documentation

## 2015-01-16 DIAGNOSIS — R1084 Generalized abdominal pain: Secondary | ICD-10-CM | POA: Insufficient documentation

## 2015-01-16 DIAGNOSIS — F209 Schizophrenia, unspecified: Secondary | ICD-10-CM | POA: Insufficient documentation

## 2015-01-16 DIAGNOSIS — Z720 Tobacco use: Secondary | ICD-10-CM | POA: Insufficient documentation

## 2015-01-17 ENCOUNTER — Emergency Department (HOSPITAL_COMMUNITY): Payer: No Typology Code available for payment source

## 2015-01-17 ENCOUNTER — Encounter (HOSPITAL_COMMUNITY): Payer: Self-pay

## 2015-01-17 LAB — URINALYSIS, ROUTINE W REFLEX MICROSCOPIC
BILIRUBIN URINE: NEGATIVE
Glucose, UA: NEGATIVE mg/dL
Hgb urine dipstick: NEGATIVE
Ketones, ur: NEGATIVE mg/dL
Leukocytes, UA: NEGATIVE
NITRITE: NEGATIVE
Protein, ur: NEGATIVE mg/dL
Specific Gravity, Urine: 1.004 — ABNORMAL LOW (ref 1.005–1.030)
UROBILINOGEN UA: 0.2 mg/dL (ref 0.0–1.0)
pH: 6 (ref 5.0–8.0)

## 2015-01-17 LAB — CBC WITH DIFFERENTIAL/PLATELET
BASOS PCT: 1 % (ref 0–1)
Basophils Absolute: 0 10*3/uL (ref 0.0–0.1)
EOS PCT: 4 % (ref 0–5)
Eosinophils Absolute: 0.2 10*3/uL (ref 0.0–0.7)
HEMATOCRIT: 41.9 % (ref 39.0–52.0)
Hemoglobin: 14.6 g/dL (ref 13.0–17.0)
Lymphocytes Relative: 50 % — ABNORMAL HIGH (ref 12–46)
Lymphs Abs: 2.2 10*3/uL (ref 0.7–4.0)
MCH: 30.5 pg (ref 26.0–34.0)
MCHC: 34.8 g/dL (ref 30.0–36.0)
MCV: 87.5 fL (ref 78.0–100.0)
MONOS PCT: 6 % (ref 3–12)
Monocytes Absolute: 0.3 10*3/uL (ref 0.1–1.0)
Neutro Abs: 1.7 10*3/uL (ref 1.7–7.7)
Neutrophils Relative %: 39 % — ABNORMAL LOW (ref 43–77)
Platelets: 184 10*3/uL (ref 150–400)
RBC: 4.79 MIL/uL (ref 4.22–5.81)
RDW: 12.2 % (ref 11.5–15.5)
WBC: 4.4 10*3/uL (ref 4.0–10.5)

## 2015-01-17 LAB — COMPREHENSIVE METABOLIC PANEL
ALT: 14 U/L — ABNORMAL LOW (ref 17–63)
AST: 23 U/L (ref 15–41)
Albumin: 4.1 g/dL (ref 3.5–5.0)
Alkaline Phosphatase: 38 U/L (ref 38–126)
Anion gap: 5 (ref 5–15)
BUN: 10 mg/dL (ref 6–20)
CO2: 29 mmol/L (ref 22–32)
CREATININE: 1.06 mg/dL (ref 0.61–1.24)
Calcium: 9.5 mg/dL (ref 8.9–10.3)
Chloride: 103 mmol/L (ref 101–111)
GFR calc Af Amer: >60 mL/min (ref >60)
Glucose, Bld: 131 mg/dL — ABNORMAL HIGH (ref 65–99)
POTASSIUM: 3.7 mmol/L (ref 3.5–5.1)
Sodium: 137 mmol/L (ref 135–145)
TOTAL PROTEIN: 6.9 g/dL (ref 6.5–8.1)
Total Bilirubin: 0.8 mg/dL (ref 0.3–1.2)

## 2015-01-17 MED ORDER — METOCLOPRAMIDE HCL 10 MG PO TABS: 10.0000 mg | ORAL_TABLET | Freq: Four times a day (QID) | ORAL | Status: DC

## 2015-01-17 MED ORDER — SODIUM CHLORIDE 0.9 % IV BOLUS (SEPSIS)
1000.0000 mL | Freq: Once | INTRAVENOUS | Status: AC
  Administered 2015-01-17: 1000 mL via INTRAVENOUS

## 2015-01-17 MED ORDER — POLYETHYLENE GLYCOL 3350 17 G PO PACK: 17.0000 g | PACK | Freq: Every day | ORAL | Status: DC

## 2015-01-17 MED ORDER — SODIUM CHLORIDE 0.9 % IV BOLUS (SEPSIS)
500.0000 mL | Freq: Once | INTRAVENOUS | Status: AC
  Administered 2015-01-17: 500 mL via INTRAVENOUS

## 2015-01-17 MED ORDER — METOCLOPRAMIDE HCL 5 MG/ML IJ SOLN
10.0000 mg | Freq: Once | INTRAMUSCULAR | Status: AC
  Administered 2015-01-17: 10 mg via INTRAVENOUS
  Filled 2015-01-17: qty 2

## 2015-01-17 NOTE — Discharge Instructions (Signed)

## 2015-01-17 NOTE — ED Provider Notes (Signed)
CSN: [id number]     Arrival date & time 01/16/15  2347 History   First MD Initiated Contact with Patient 01/17/15 0110     Chief Complaint  Patient presents with  . Abdominal Pain     (Consider location/radiation/quality/duration/timing/severity/associated sxs/prior Treatment) Patient is a 34 y.o. male presenting with abdominal pain. The history is provided by the patient. No language interpreter was used.  Abdominal Pain Pain location:  Generalized Associated symptoms: constipation and nausea   Associated symptoms: no chest pain, no chills, no dysuria, no fever, no shortness of breath and no vomiting   Associated symptoms comment:  Lower greater than upper abdominal pain and cramping for the past 2 days. No known fever. He has had nausea without vomiting. He reports no bowel movements for 2 days with passing very little gas. He reports history of bowel obstruction. He has had a nephrectomy in the past, no other abdominal surgeries.    Past Medical History  Diagnosis Date  . Seizures   . Hypertension   . Depression   . Pancreatitis   . Liver cirrhosis   . Coronary artery disease   . Angina   . Shortness of breath   . Headache(784.0)   . Peripheral vascular disease April 2011    Left Pop  . Hypercholesteremia   . Schizophrenia   . Bipolar 1 disorder   . Cancer of kidney dx'd 04/2013    lt nephrectomy  . Breast CA dx'd 2009    bil w/ bil masectomy and oral meds  . H/O suicide attempt 2015    overdose   Past Surgical History  Procedure Laterality Date  . Chest surgery    . Left leg surgery    . Mastectomy    . Left kidney removal    . Breast surgery     Family History  Problem Relation Age of Onset  . Stroke Other   . Cancer Other   . Hyperlipidemia Mother   . Hypertension Mother    History  Substance Use Topics  . Smoking status: Current Every Day Smoker -- 0.25 packs/day for 10 years    Types: Cigarettes  . Smokeless tobacco: Never Used  . Alcohol Use: No      Comment: former    Review of Systems  Constitutional: Negative for fever and chills.  Respiratory: Negative.  Negative for shortness of breath.   Cardiovascular: Negative.  Negative for chest pain.  Gastrointestinal: Positive for nausea, abdominal pain and constipation. Negative for vomiting.  Genitourinary: Negative.  Negative for dysuria.  Musculoskeletal: Negative.   Skin: Negative.   Neurological: Negative.  Negative for weakness.      Allergies  Codeine; Depakote; Morphine; Penicillins; Oxycodone; and Hydrocodone-acetaminophen  Home Medications   Prior to Admission medications   Medication Sig Start Date End Date Taking? Authorizing Provider  ARIPiprazole (ABILIFY) 10 MG tablet Take 1 tablet (10 mg total) by mouth daily. 09/13/14  Yes [name], NP  atorvastatin (LIPITOR) 40 MG tablet Take 40 mg by mouth daily.   Yes Historical Provider, MD  gabapentin (NEURONTIN) 300 MG capsule Take 1 capsule (300 mg total) by mouth at bedtime. Patient taking differently: Take 300 mg by mouth 3 (three) times daily.  09/13/14  Yes [name], NP  metoprolol succinate (TOPROL-XL) 50 MG 24 hr tablet Take 50 mg by mouth daily. Take with or immediately following a meal.   Yes Historical Provider, MD  traZODone (DESYREL) 100 MG tablet Take 100-200 mg by  mouth at bedtime.   Yes Historical Provider, MD  amLODipine (NORVASC) 5 MG tablet Take 1 tablet (5 mg total) by mouth daily. Patient not taking: Reported on 12/25/2014 09/13/14   [name], NP  dicyclomine (BENTYL) 10 MG capsule Take 1 capsule (10 mg total) by mouth 4 (four) times daily -  before meals and at bedtime. Patient not taking: Reported on 12/25/2014 11/19/14   Jeannett Senior, PA-C  DULoxetine (CYMBALTA) 30 MG capsule Take 1 capsule (30 mg total) by mouth daily. Patient not taking: Reported on 01/17/2015 09/13/14   [name], NP  gabapentin (NEURONTIN) 100 MG capsule Take 2 capsules (200 mg total) by mouth 3 (three) times  daily. Patient not taking: Reported on 01/17/2015 09/13/14   [name], NP  hydrOXYzine (ATARAX/VISTARIL) 25 MG tablet Take 1 tablet (25 mg total) by mouth every 6 (six) hours as needed for anxiety, itching, nausea or vomiting. Patient not taking: Reported on 01/17/2015 09/13/14   [name], NP  ibuprofen (ADVIL,MOTRIN) 800 MG tablet Take 1 tablet (800 mg total) by mouth every 8 (eight) hours as needed for moderate pain. Patient not taking: Reported on 08/29/2014 08/15/14   Domenic Moras, PA-C  metoprolol tartrate (LOPRESSOR) 25 MG tablet Take 1 tablet (25 mg total) by mouth 2 (two) times daily. Patient not taking: Reported on 12/25/2014 09/13/14   [name], NP  mirtazapine (REMERON) 15 MG tablet Take 1 tablet (15 mg total) by mouth at bedtime. Patient not taking: Reported on 12/25/2014 09/13/14   [name], NP  Multiple Vitamin (MULTIVITAMIN WITH MINERALS) TABS tablet Take 1 tablet by mouth daily. May purchase over the counter to improve general health. Patient not taking: Reported on 12/25/2014 09/13/14   [name], NP  naproxen (NAPROSYN) 500 MG tablet Take 1 tablet (500 mg total) by mouth 2 (two) times daily. Patient not taking: Reported on 12/25/2014 11/19/14   Jeannett Senior, PA-C  prochlorperazine (COMPAZINE) 10 MG tablet Take 1 tablet (10 mg total) by mouth 2 (two) times daily as needed for nausea or vomiting (Nausea ). Patient not taking: Reported on 01/17/2015 12/25/14   Margarita Mail, PA-C  zolpidem (AMBIEN) 10 MG tablet Take 1 tablet (10 mg total) by mouth at bedtime. Patient not taking: Reported on 12/25/2014 09/13/14   [name], NP   BP 110/65 mmHg  Pulse 69  Temp(Src) 97.6 F (36.4 C) (Oral)  Resp 16  SpO2 98% Physical Exam  Constitutional: He is oriented to person, place, and time. He appears well-developed and well-nourished.  HENT:  Head: Normocephalic.  Neck: Normal range of motion. Neck supple.  Cardiovascular: Normal rate and regular rhythm.   Pulmonary/Chest:  Effort normal and breath sounds normal. He has no wheezes. He has no rales.  Abdominal: Soft. Bowel sounds are normal. There is tenderness. There is no rebound and no guarding.  Generalized abdominal tenderness with greatest tenderness in LLQ. Soft abdomen. BS hypoactive.  Musculoskeletal: Normal range of motion.  Neurological: He is alert and oriented to person, place, and time.  Skin: Skin is warm and dry. No rash noted.  Psychiatric: He has a normal mood and affect.    ED Course  Procedures (including critical care time) Labs Review Labs Reviewed  CBC WITH DIFFERENTIAL/PLATELET - Abnormal; Notable for the following:    Neutrophils Relative % 39 (*)    Lymphocytes Relative 50 (*)    All other components within normal limits  URINALYSIS, ROUTINE W REFLEX MICROSCOPIC (  NOT AT [facility]) - Abnormal; Notable for the following:    Specific Gravity, Urine 1.004 (*)    All other components within normal limits  COMPREHENSIVE METABOLIC PANEL    Imaging Review Dg Abd Acute W/chest  01/17/2015   CLINICAL DATA:  Epigastric pain for 1 day  EXAM: DG ABDOMEN ACUTE W/ 1V CHEST  COMPARISON:  09/09/2014  FINDINGS: Cardiac shadow is within normal limits. The lungs are well aerated bilaterally.  Scattered large and small bowel gas is noted within the abdomen. No free air is seen. No obstructive changes are noted.  IMPRESSION: Negative abdominal radiographs.  No acute cardiopulmonary disease.   Electronically Signed   By: Inez Catalina M.D.   On: 01/17/2015 02:11     EKG Interpretation None      MDM   Final diagnoses:  None    1. Abdominal pain 2. Nausea.  3:30:  His abdominal pain is some better with IV fluids and Reglan. Labs unremarkable. He reports he still feels poorly "shakey". Will provide additional fluids and will discharge home. Patient is comfortable with plan.  Charlann Lange, PA-C 01/17/15 [place], DO 01/17/15 [phone]

## 2015-01-17 NOTE — ED Notes (Signed)
Pt complains of abdominal pain and a headache all day, no vomiting or diarrhea but is really nauseated

## 2015-04-10 ENCOUNTER — Encounter (HOSPITAL_COMMUNITY): Payer: Self-pay | Admitting: Emergency Medicine

## 2015-04-10 ENCOUNTER — Inpatient Hospital Stay (HOSPITAL_COMMUNITY)
Admission: EM | Admit: 2015-04-10 | Discharge: 2015-04-13 | DRG: 918 | Disposition: A | Discharge status: Home / Self Care | Payer: Self-pay | Attending: Internal Medicine | Admitting: Internal Medicine

## 2015-04-10 DIAGNOSIS — F1910 Other psychoactive substance abuse, uncomplicated: Secondary | ICD-10-CM | POA: Diagnosis present

## 2015-04-10 DIAGNOSIS — Z905 Acquired absence of kidney: Secondary | ICD-10-CM

## 2015-04-10 DIAGNOSIS — F1414 Cocaine abuse with cocaine-induced mood disorder: Secondary | ICD-10-CM

## 2015-04-10 DIAGNOSIS — F10220 Alcohol dependence with intoxication, uncomplicated: Secondary | ICD-10-CM | POA: Diagnosis present

## 2015-04-10 DIAGNOSIS — F209 Schizophrenia, unspecified: Secondary | ICD-10-CM | POA: Diagnosis present

## 2015-04-10 DIAGNOSIS — I739 Peripheral vascular disease, unspecified: Secondary | ICD-10-CM | POA: Diagnosis present

## 2015-04-10 DIAGNOSIS — R945 Abnormal results of liver function studies: Secondary | ICD-10-CM

## 2015-04-10 DIAGNOSIS — Z9013 Acquired absence of bilateral breasts and nipples: Secondary | ICD-10-CM | POA: Diagnosis present

## 2015-04-10 DIAGNOSIS — Z85528 Personal history of other malignant neoplasm of kidney: Secondary | ICD-10-CM

## 2015-04-10 DIAGNOSIS — K760 Fatty (change of) liver, not elsewhere classified: Secondary | ICD-10-CM | POA: Diagnosis present

## 2015-04-10 DIAGNOSIS — F14129 Cocaine abuse with intoxication, unspecified: Secondary | ICD-10-CM | POA: Diagnosis present

## 2015-04-10 DIAGNOSIS — K7460 Unspecified cirrhosis of liver: Secondary | ICD-10-CM | POA: Diagnosis present

## 2015-04-10 DIAGNOSIS — Y908 Blood alcohol level of 240 mg/100 ml or more: Secondary | ICD-10-CM | POA: Diagnosis present

## 2015-04-10 DIAGNOSIS — R7989 Other specified abnormal findings of blood chemistry: Secondary | ICD-10-CM

## 2015-04-10 DIAGNOSIS — F10929 Alcohol use, unspecified with intoxication, unspecified: Secondary | ICD-10-CM

## 2015-04-10 DIAGNOSIS — Z885 Allergy status to narcotic agent status: Secondary | ICD-10-CM

## 2015-04-10 DIAGNOSIS — I10 Essential (primary) hypertension: Secondary | ICD-10-CM | POA: Diagnosis present

## 2015-04-10 DIAGNOSIS — F319 Bipolar disorder, unspecified: Secondary | ICD-10-CM | POA: Diagnosis present

## 2015-04-10 DIAGNOSIS — F1020 Alcohol dependence, uncomplicated: Secondary | ICD-10-CM

## 2015-04-10 DIAGNOSIS — T50902A Poisoning by unspecified drugs, medicaments and biological substances, intentional self-harm, initial encounter: Secondary | ICD-10-CM

## 2015-04-10 DIAGNOSIS — Z880 Allergy status to penicillin: Secondary | ICD-10-CM

## 2015-04-10 DIAGNOSIS — R569 Unspecified convulsions: Secondary | ICD-10-CM | POA: Diagnosis present

## 2015-04-10 DIAGNOSIS — I2510 Atherosclerotic heart disease of native coronary artery without angina pectoris: Secondary | ICD-10-CM | POA: Diagnosis present

## 2015-04-10 DIAGNOSIS — F17210 Nicotine dependence, cigarettes, uncomplicated: Secondary | ICD-10-CM | POA: Diagnosis present

## 2015-04-10 DIAGNOSIS — F3162 Bipolar disorder, current episode mixed, moderate: Secondary | ICD-10-CM

## 2015-04-10 DIAGNOSIS — E780 Pure hypercholesterolemia: Secondary | ICD-10-CM | POA: Diagnosis present

## 2015-04-10 DIAGNOSIS — R740 Nonspecific elevation of levels of transaminase and lactic acid dehydrogenase [LDH]: Secondary | ICD-10-CM | POA: Diagnosis present

## 2015-04-10 DIAGNOSIS — T391X2A Poisoning by 4-Aminophenol derivatives, intentional self-harm, initial encounter: Principal | ICD-10-CM | POA: Diagnosis present

## 2015-04-10 DIAGNOSIS — F200 Paranoid schizophrenia: Secondary | ICD-10-CM

## 2015-04-10 DIAGNOSIS — Z79899 Other long term (current) drug therapy: Secondary | ICD-10-CM

## 2015-04-10 DIAGNOSIS — T50901A Poisoning by unspecified drugs, medicaments and biological substances, accidental (unintentional), initial encounter: Secondary | ICD-10-CM | POA: Diagnosis present

## 2015-04-10 DIAGNOSIS — Z853 Personal history of malignant neoplasm of breast: Secondary | ICD-10-CM

## 2015-04-10 LAB — COMPREHENSIVE METABOLIC PANEL
ALT: 241 U/L — AB (ref 17–63)
AST: 298 U/L — AB (ref 15–41)
Albumin: 3.9 g/dL (ref 3.5–5.0)
Alkaline Phosphatase: 68 U/L (ref 38–126)
Anion gap: 13 (ref 5–15)
BUN: 5 mg/dL — ABNORMAL LOW (ref 6–20)
CHLORIDE: 103 mmol/L (ref 101–111)
CO2: 25 mmol/L (ref 22–32)
CREATININE: 0.81 mg/dL (ref 0.61–1.24)
Calcium: 8.9 mg/dL (ref 8.9–10.3)
GFR calc non Af Amer: >60 mL/min (ref >60)
Glucose, Bld: 99 mg/dL (ref 65–99)
Potassium: 3.8 mmol/L (ref 3.5–5.1)
Sodium: 141 mmol/L (ref 135–145)
Total Bilirubin: 0.4 mg/dL (ref 0.3–1.2)
Total Protein: 7.1 g/dL (ref 6.5–8.1)

## 2015-04-10 LAB — CBC WITH DIFFERENTIAL/PLATELET
Basophils Absolute: 0 10*3/uL (ref 0.0–0.1)
Basophils Relative: 0 %
EOS ABS: 0.1 10*3/uL (ref 0.0–0.7)
Eosinophils Relative: 2 %
HEMATOCRIT: 46.4 % (ref 39.0–52.0)
HEMOGLOBIN: 16.4 g/dL (ref 13.0–17.0)
Lymphocytes Relative: 54 %
Lymphs Abs: 1.9 10*3/uL (ref 0.7–4.0)
MCH: 29.5 pg (ref 26.0–34.0)
MCHC: 35.3 g/dL (ref 30.0–36.0)
MCV: 83.5 fL (ref 78.0–100.0)
MONOS PCT: 7 %
Monocytes Absolute: 0.3 10*3/uL (ref 0.1–1.0)
NEUTROS PCT: 37 %
Neutro Abs: 1.3 10*3/uL — ABNORMAL LOW (ref 1.7–7.7)
Platelets: 156 10*3/uL (ref 150–400)
RBC: 5.56 MIL/uL (ref 4.22–5.81)
RDW: 14.4 % (ref 11.5–15.5)
WBC: 3.5 10*3/uL — ABNORMAL LOW (ref 4.0–10.5)

## 2015-04-10 LAB — SALICYLATE LEVEL: Salicylate Lvl: <4 mg/dL (ref 2.8–30.0)

## 2015-04-10 LAB — ETHANOL: Alcohol, Ethyl (B): 453 mg/dL (ref <5)

## 2015-04-10 LAB — ACETAMINOPHEN LEVEL

## 2015-04-10 MED ORDER — LORAZEPAM 2 MG/ML IJ SOLN
1.0000 mg | Freq: Four times a day (QID) | INTRAMUSCULAR | Status: DC | PRN
  Administered 2015-04-12 (×2): 1 mg via INTRAVENOUS
  Filled 2015-04-10 (×2): qty 1

## 2015-04-10 MED ORDER — SODIUM CHLORIDE 0.9 % IJ SOLN
3.0000 mL | Freq: Two times a day (BID) | INTRAMUSCULAR | Status: DC
  Administered 2015-04-10 – 2015-04-12 (×5): 3 mL via INTRAVENOUS

## 2015-04-10 MED ORDER — LORAZEPAM 1 MG PO TABS
1.0000 mg | ORAL_TABLET | Freq: Four times a day (QID) | ORAL | Status: DC | PRN
  Administered 2015-04-11 – 2015-04-12 (×2): 1 mg via ORAL
  Filled 2015-04-10 (×2): qty 1

## 2015-04-10 MED ORDER — METOPROLOL TARTRATE 25 MG PO TABS
25.0000 mg | ORAL_TABLET | Freq: Two times a day (BID) | ORAL | Status: DC
  Administered 2015-04-11 – 2015-04-13 (×6): 25 mg via ORAL
  Filled 2015-04-10 (×7): qty 1

## 2015-04-10 MED ORDER — ONDANSETRON HCL 4 MG PO TABS
4.0000 mg | ORAL_TABLET | Freq: Four times a day (QID) | ORAL | Status: DC | PRN
  Administered 2015-04-11: 4 mg via ORAL
  Filled 2015-04-10: qty 1

## 2015-04-10 MED ORDER — FOLIC ACID 1 MG PO TABS
1.0000 mg | ORAL_TABLET | Freq: Every day | ORAL | Status: DC
  Administered 2015-04-11 – 2015-04-13 (×3): 1 mg via ORAL
  Filled 2015-04-10 (×3): qty 1

## 2015-04-10 MED ORDER — SODIUM CHLORIDE 0.9 % IV SOLN
INTRAVENOUS | Status: DC
  Administered 2015-04-10 – 2015-04-13 (×6): via INTRAVENOUS

## 2015-04-10 MED ORDER — GABAPENTIN 300 MG PO CAPS
300.0000 mg | ORAL_CAPSULE | Freq: Every day | ORAL | Status: DC
  Administered 2015-04-11 – 2015-04-12 (×3): 300 mg via ORAL
  Filled 2015-04-10 (×5): qty 1

## 2015-04-10 MED ORDER — ONDANSETRON HCL 4 MG/2ML IJ SOLN: 4.0000 mg | Freq: Four times a day (QID) | INTRAMUSCULAR | Status: DC | PRN

## 2015-04-10 MED ORDER — ARIPIPRAZOLE 5 MG PO TABS
10.0000 mg | ORAL_TABLET | Freq: Every day | ORAL | Status: DC
  Administered 2015-04-11 – 2015-04-13 (×3): 10 mg via ORAL
  Filled 2015-04-10: qty 1
  Filled 2015-04-10: qty 2
  Filled 2015-04-10: qty 1

## 2015-04-10 MED ORDER — METOCLOPRAMIDE HCL 10 MG PO TABS
10.0000 mg | ORAL_TABLET | Freq: Four times a day (QID) | ORAL | Status: DC
  Administered 2015-04-11 – 2015-04-13 (×11): 10 mg via ORAL
  Filled 2015-04-10 (×13): qty 1

## 2015-04-10 MED ORDER — ADULT MULTIVITAMIN W/MINERALS CH: 1.0000 | ORAL_TABLET | Freq: Every day | ORAL | Status: DC

## 2015-04-10 MED ORDER — DICYCLOMINE HCL 10 MG PO CAPS
10.0000 mg | ORAL_CAPSULE | Freq: Three times a day (TID) | ORAL | Status: DC
  Administered 2015-04-11 – 2015-04-13 (×9): 10 mg via ORAL
  Filled 2015-04-10 (×12): qty 1

## 2015-04-10 MED ORDER — POLYETHYLENE GLYCOL 3350 17 G PO PACK
17.0000 g | PACK | Freq: Every day | ORAL | Status: DC
  Administered 2015-04-11 – 2015-04-12 (×2): 17 g via ORAL
  Filled 2015-04-10 (×3): qty 1

## 2015-04-10 MED ORDER — SODIUM CHLORIDE 0.9 % IV SOLN
1000.0000 mL | INTRAVENOUS | Status: DC
Start: 2015-04-10 — End: 2015-04-13
  Administered 2015-04-10: 1000 mL via INTRAVENOUS

## 2015-04-10 MED ORDER — AMLODIPINE BESYLATE 5 MG PO TABS
5.0000 mg | ORAL_TABLET | Freq: Every day | ORAL | Status: DC
  Administered 2015-04-11 – 2015-04-13 (×3): 5 mg via ORAL
  Filled 2015-04-10 (×3): qty 1

## 2015-04-10 MED ORDER — VITAMIN B-1 100 MG PO TABS
100.0000 mg | ORAL_TABLET | Freq: Every day | ORAL | Status: DC
  Administered 2015-04-11 – 2015-04-13 (×3): 100 mg via ORAL
  Filled 2015-04-10 (×3): qty 1

## 2015-04-10 MED ORDER — GABAPENTIN 100 MG PO CAPS
200.0000 mg | ORAL_CAPSULE | Freq: Three times a day (TID) | ORAL | Status: DC
  Administered 2015-04-11 – 2015-04-13 (×8): 200 mg via ORAL
  Filled 2015-04-10 (×9): qty 2

## 2015-04-10 MED ORDER — PROCHLORPERAZINE MALEATE 10 MG PO TABS
10.0000 mg | ORAL_TABLET | Freq: Two times a day (BID) | ORAL | Status: DC | PRN
  Filled 2015-04-10: qty 1

## 2015-04-10 MED ORDER — PANTOPRAZOLE SODIUM 40 MG IV SOLR
40.0000 mg | Freq: Two times a day (BID) | INTRAVENOUS | Status: DC
  Administered 2015-04-10 – 2015-04-11 (×2): 40 mg via INTRAVENOUS
  Filled 2015-04-10 (×3): qty 40

## 2015-04-10 MED ORDER — ENOXAPARIN SODIUM 40 MG/0.4ML ~~LOC~~ SOLN
40.0000 mg | Freq: Every day | SUBCUTANEOUS | Status: DC
  Administered 2015-04-11 – 2015-04-12 (×3): 40 mg via SUBCUTANEOUS
  Filled 2015-04-10 (×4): qty 0.4

## 2015-04-10 MED ORDER — THIAMINE HCL 100 MG/ML IJ SOLN
100.0000 mg | Freq: Every day | INTRAMUSCULAR | Status: DC
  Filled 2015-04-10 (×2): qty 1

## 2015-04-10 MED ORDER — ADULT MULTIVITAMIN W/MINERALS CH
1.0000 | ORAL_TABLET | Freq: Every day | ORAL | Status: DC
  Administered 2015-04-11 – 2015-04-13 (×3): 1 via ORAL
  Filled 2015-04-10 (×3): qty 1

## 2015-04-10 NOTE — ED Notes (Signed)
Kalman Shan (Mother) -- [phone]

## 2015-04-10 NOTE — ED Notes (Signed)
Pt brought to ED by his mother who st's pt took unknown amount of Advil PM with alcohol.  Pt st's he took 16 pills approx 1 hour ago.  Pt very lethargic with slurring of words.

## 2015-04-10 NOTE — H&P (Signed)
[facility]                                                                                    Patient Demographics  [name], is a 34 y.o. male  CSN: [id number]  MRN: [id number]  DOB - 02/26/1981  Admit Date - 04/10/2015  Outpatient Primary MD for the patient is Fabens   With History of -  Past Medical History  Diagnosis Date  . Seizures   . Hypertension   . Depression   . Pancreatitis   . Liver cirrhosis   . Coronary artery disease   . Angina   . Shortness of breath   . Headache(784.0)   . Peripheral vascular disease April 2011    Left Pop  . Hypercholesteremia   . Schizophrenia   . Bipolar 1 disorder   . Cancer of kidney dx'd 04/2013    lt nephrectomy  . Breast CA dx'd 2009    bil w/ bil masectomy and oral meds  . H/O suicide attempt 2015    overdose      Past Surgical History  Procedure Laterality Date  . Chest surgery    . Left leg surgery    . Mastectomy    . Left kidney removal    . Breast surgery      in for   Chief Complaint  Patient presents with  . Drug Overdose     HPI  [name]  is a 34 y.o. male, past medical history significant for schizophrenia and bipolar disorder history of pancreatitis, alcoholism, kidney cancer status post left nephrectomy today to the emergency room for drug overdose with 9 pills of Advil PM. The patient reports it was Tylenol PM however the bottle was that of Advil. In the emergency room his alcohol level was elevated and the patient had altered mental status so we decided to admit him.    Review of Systems    Unable to obtain     Social History Social History  Substance Use Topics  . Smoking status: Current Every Day Smoker -- 0.25 packs/day for 10 years    Types: Cigarettes  . Smokeless tobacco: Never Used  . Alcohol Use: No     Comment: former     Family History Family History  Problem Relation Age of Onset  . Stroke Other   . Cancer  Other   . Hyperlipidemia Mother   . Hypertension Mother      Prior to Admission medications   Medication Sig Start Date End Date Taking? Authorizing Provider  ARIPiprazole (ABILIFY) 5 MG tablet Take 5 mg by mouth 2 (two) times daily.   Yes Historical Provider, MD  DULoxetine (CYMBALTA) 30 MG capsule Take 1 capsule (30 mg total) by mouth daily. 09/13/14  Yes [name], NP  gabapentin (NEURONTIN) 100 MG capsule Take 200 mg by mouth 3 (three) times daily.   Yes Historical Provider, MD  hydrOXYzine (ATARAX/VISTARIL) 25 MG tablet Take 1 tablet (25 mg total) by mouth every 6 (six) hours as needed for anxiety, itching, nausea or vomiting. 09/13/14  Yes [name], NP  traZODone ([place])  100 MG tablet Take 100-200 mg by mouth at bedtime.   Yes Historical Provider, MD  amLODipine (NORVASC) 5 MG tablet Take 1 tablet (5 mg total) by mouth daily. Patient not taking: Reported on 12/25/2014 09/13/14   [name], NP  ARIPiprazole (ABILIFY) 10 MG tablet Take 1 tablet (10 mg total) by mouth daily. Patient not taking: Reported on 04/10/2015 09/13/14   [name], NP  dicyclomine (BENTYL) 10 MG capsule Take 1 capsule (10 mg total) by mouth 4 (four) times daily -  before meals and at bedtime. Patient not taking: Reported on 12/25/2014 11/19/14   Tatyana Kirichenko, PA-C  gabapentin (NEURONTIN) 100 MG capsule Take 2 capsules (200 mg total) by mouth 3 (three) times daily. Patient not taking: Reported on 01/17/2015 09/13/14   [name], NP  gabapentin (NEURONTIN) 300 MG capsule Take 1 capsule (300 mg total) by mouth at bedtime. Patient not taking: Reported on 04/10/2015 09/13/14   [name], NP  ibuprofen (ADVIL,MOTRIN) 800 MG tablet Take 1 tablet (800 mg total) by mouth every 8 (eight) hours as needed for moderate pain. Patient not taking: Reported on 08/29/2014 08/15/14   Domenic Moras, PA-C  metoCLOPramide (REGLAN) 10 MG tablet Take 1 tablet (10 mg total) by mouth every 6 (six) hours. Patient not taking:  Reported on 04/10/2015 01/17/15   Charlann Lange, PA-C  metoprolol succinate (TOPROL-XL) 50 MG 24 hr tablet Take 50 mg by mouth daily. Take with or immediately following a meal.    Historical Provider, MD  metoprolol tartrate (LOPRESSOR) 25 MG tablet Take 1 tablet (25 mg total) by mouth 2 (two) times daily. Patient not taking: Reported on 12/25/2014 09/13/14   [name], NP  mirtazapine (REMERON) 15 MG tablet Take 1 tablet (15 mg total) by mouth at bedtime. Patient not taking: Reported on 12/25/2014 09/13/14   [name], NP  Multiple Vitamin (MULTIVITAMIN WITH MINERALS) TABS tablet Take 1 tablet by mouth daily. May purchase over the counter to improve general health. Patient not taking: Reported on 12/25/2014 09/13/14   [name], NP  naproxen (NAPROSYN) 500 MG tablet Take 1 tablet (500 mg total) by mouth 2 (two) times daily. Patient not taking: Reported on 12/25/2014 11/19/14   Tatyana Kirichenko, PA-C  polyethylene glycol [facility]) packet Take 17 g by mouth daily. Patient not taking: Reported on 04/10/2015 01/17/15   Charlann Lange, PA-C  prochlorperazine (COMPAZINE) 10 MG tablet Take 1 tablet (10 mg total) by mouth 2 (two) times daily as needed for nausea or vomiting (Nausea ). Patient not taking: Reported on 01/17/2015 12/25/14   Margarita Mail, PA-C  zolpidem (AMBIEN) 10 MG tablet Take 1 tablet (10 mg total) by mouth at bedtime. Patient not taking: Reported on 12/25/2014 09/13/14   [name], NP    Allergies  Allergen Reactions  . Codeine Hives, Itching and Swelling  . Depakote [Divalproex Sodium] Other (See Comments)    "Bug out and hallucinate"  . Morphine Itching  . Penicillins Swelling  . Oxycodone Itching and Swelling  . Hydrocodone-Acetaminophen Itching and Rash    Physical Exam  Vitals  Blood pressure 125/86, pulse 96, temperature 97.9 F (36.6 C), temperature source Oral, resp. rate 19, SpO2 97 %.   1. General lying in bed, well-developed, well-nourished , smell of alcohol  noted  2. Obtunded.  3. No F.N deficits, grossly, moving all extremities   4. Ears and Eyes appear Normal, Conjunctivae clear, PERRLA. Moist Oral Mucosa.  5. Supple  Neck, No JVD, No cervical lymphadenopathy appriciated, No Carotid Bruits.  6. Symmetrical Chest wall movement, Good air movement bilaterally, CTAB.  7. RRR, No Gallops, Rubs or Murmurs, No Parasternal Heave.  8. Positive Bowel Sounds, Abdomen Soft, Non tender, No organomegaly appriciated,No rebound -guarding or rigidity.  9.  No Cyanosis, Normal Skin Turgor, No Skin Rash or Bruise.  10.   joints appear normal , no effusions, Normal ROM.    Data Review  CBC  Recent Labs Lab 04/10/15 2023  WBC 3.5*  HGB 16.4  HCT 46.4  PLT 156  MCV 83.5  MCH 29.5  MCHC 35.3  RDW 14.4  LYMPHSABS 1.9  MONOABS 0.3  EOSABS 0.1  BASOSABS 0.0   ------------------------------------------------------------------------------------------------------------------  Chemistries   Recent Labs Lab 04/10/15 2023  NA 141  K 3.8  CL 103  CO2 25  GLUCOSE 99  BUN 5*  CREATININE 0.81  CALCIUM 8.9  AST 298*  ALT 241*  ALKPHOS 68  BILITOT 0.4   ------------------------------------------------------------------------------------------------------------------ CrCl cannot be calculated (Unknown ideal weight.). ------------------------------------------------------------------------------------------------------------------ No results for input(s): TSH, T4TOTAL, T3FREE, THYROIDAB in the last 72 hours.  Invalid input(s): FREET3   Coagulation profile No results for input(s): INR, PROTIME in the last 168 hours. ------------------------------------------------------------------------------------------------------------------- No results for input(s): DDIMER in the last 72 hours. -------------------------------------------------------------------------------------------------------------------  Cardiac Enzymes No results for  input(s): CKMB, TROPONINI, MYOGLOBIN in the last 168 hours.  Invalid input(s): CK ------------------------------------------------------------------------------------------------------------------ Invalid input(s): POCBNP   ---------------------------------------------------------------------------------------------------------------  Urinalysis    Component Value Date/Time   COLORURINE YELLOW 01/17/2015 0138   APPEARANCEUR CLEAR 01/17/2015 0138   LABSPEC 1.004* 01/17/2015 0138   PHURINE 6.0 01/17/2015 0138   GLUCOSEU NEGATIVE 01/17/2015 0138   HGBUR NEGATIVE 01/17/2015 0138   HGBUR negative 12/22/2007 0831   BILIRUBINUR NEGATIVE 01/17/2015 0138   KETONESUR NEGATIVE 01/17/2015 0138   PROTEINUR NEGATIVE 01/17/2015 0138   UROBILINOGEN 0.2 01/17/2015 0138   NITRITE NEGATIVE 01/17/2015 0138   LEUKOCYTESUR NEGATIVE 01/17/2015 0138    ----------------------------------------------------------------------------------------------------------------  Imaging results:   No results found.  My personal review of EKG: Rhythm NSR, at 84 bpm with no acute changes    Assessment & Plan  1. Overdose with Advil PM,? Tylenol     Check Tylenol level in 4 hours although the bottle says Advil     IV fluids     1:1      Hold medications  2. History of schizophrenia and bipolar disorder     Consult psychiatry in a.m.  3. 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 Alcoholism     When necessary Ativan     Withdrawal protocol  5. Transaminitis     ? A chronic hepatitis     Follow liver function tests in a.m.    DVT Prophylaxis Lovenox  AM Labs Ordered, also please review Full Orders    Code Status full  Disposition Plan: Unknown  Time spent in minutes : 33 minutes  Condition GUARDED   @SIGNATURE @

## 2015-04-10 NOTE — ED Provider Notes (Signed)
CSN: [id number]     Arrival date & time 04/10/15  1941 History   First MD Initiated Contact with Patient 04/10/15 2003     Chief Complaint  Patient presents with  . Drug Overdose    HPI Patient presents to the emergency room for evaluation of a drug overdose associated with alcohol abuse. Patient has a history of multiple medical problems including previous suicide attempts, schizophrenia and bipolar disorder. Patient was drinking alcohol today. She states he took 16 pills approximate 1 hour ago in a suicide attempt. Patient states he took Tylenol PM however the pill bottle was brought in and it was Advil PM. Past Medical History  Diagnosis Date  . Seizures   . Hypertension   . Depression   . Pancreatitis   . Liver cirrhosis   . Coronary artery disease   . Angina   . Shortness of breath   . Headache(784.0)   . Peripheral vascular disease April 2011    Left Pop  . Hypercholesteremia   . Schizophrenia   . Bipolar 1 disorder   . Cancer of kidney dx'd 04/2013    lt nephrectomy  . Breast CA dx'd 2009    bil w/ bil masectomy and oral meds  . H/O suicide attempt 2015    overdose   Past Surgical History  Procedure Laterality Date  . Chest surgery    . Left leg surgery    . Mastectomy    . Left kidney removal    . Breast surgery     Family History  Problem Relation Age of Onset  . Stroke Other   . Cancer Other   . Hyperlipidemia Mother   . Hypertension Mother    Social History  Substance Use Topics  . Smoking status: Current Every Day Smoker -- 0.25 packs/day for 10 years    Types: Cigarettes  . Smokeless tobacco: Never Used  . Alcohol Use: No     Comment: former    Review of Systems  All other systems reviewed and are negative.     Allergies  Codeine; Depakote; Morphine; Penicillins; Oxycodone; and Hydrocodone-acetaminophen  Home Medications   Prior to Admission medications   Medication Sig Start Date End Date Taking? Authorizing Provider  ARIPiprazole  (ABILIFY) 5 MG tablet Take 5 mg by mouth 2 (two) times daily.   Yes Historical Provider, MD  DULoxetine (CYMBALTA) 30 MG capsule Take 1 capsule (30 mg total) by mouth daily. 09/13/14  Yes [name], NP  gabapentin (NEURONTIN) 100 MG capsule Take 200 mg by mouth 3 (three) times daily.   Yes Historical Provider, MD  hydrOXYzine (ATARAX/VISTARIL) 25 MG tablet Take 1 tablet (25 mg total) by mouth every 6 (six) hours as needed for anxiety, itching, nausea or vomiting. 09/13/14  Yes [name], NP  traZODone (DESYREL) 100 MG tablet Take 100-200 mg by mouth at bedtime.   Yes Historical Provider, MD  amLODipine (NORVASC) 5 MG tablet Take 1 tablet (5 mg total) by mouth daily. Patient not taking: Reported on 12/25/2014 09/13/14   [name], NP  ARIPiprazole (ABILIFY) 10 MG tablet Take 1 tablet (10 mg total) by mouth daily. Patient not taking: Reported on 04/10/2015 09/13/14   [name], NP  dicyclomine (BENTYL) 10 MG capsule Take 1 capsule (10 mg total) by mouth 4 (four) times daily -  before meals and at bedtime. Patient not taking: Reported on 12/25/2014 11/19/14   Lahoma Rocker Kirichenko, PA-C  gabapentin (NEURONTIN) 100 MG capsule Take  2 capsules (200 mg total) by mouth 3 (three) times daily. Patient not taking: Reported on 01/17/2015 09/13/14   [name], NP  gabapentin (NEURONTIN) 300 MG capsule Take 1 capsule (300 mg total) by mouth at bedtime. Patient not taking: Reported on 04/10/2015 09/13/14   [name], NP  ibuprofen (ADVIL,MOTRIN) 800 MG tablet Take 1 tablet (800 mg total) by mouth every 8 (eight) hours as needed for moderate pain. Patient not taking: Reported on 08/29/2014 08/15/14   Domenic Moras, PA-C  metoCLOPramide (REGLAN) 10 MG tablet Take 1 tablet (10 mg total) by mouth every 6 (six) hours. Patient not taking: Reported on 04/10/2015 01/17/15   Charlann Lange, PA-C  metoprolol succinate (TOPROL-XL) 50 MG 24 hr tablet Take 50 mg by mouth daily. Take with or immediately following a meal.     Historical Provider, MD  metoprolol tartrate (LOPRESSOR) 25 MG tablet Take 1 tablet (25 mg total) by mouth 2 (two) times daily. Patient not taking: Reported on 12/25/2014 09/13/14   [name], NP  mirtazapine (REMERON) 15 MG tablet Take 1 tablet (15 mg total) by mouth at bedtime. Patient not taking: Reported on 12/25/2014 09/13/14   [name], NP  Multiple Vitamin (MULTIVITAMIN WITH MINERALS) TABS tablet Take 1 tablet by mouth daily. May purchase over the counter to improve general health. Patient not taking: Reported on 12/25/2014 09/13/14   [name], NP  naproxen (NAPROSYN) 500 MG tablet Take 1 tablet (500 mg total) by mouth 2 (two) times daily. Patient not taking: Reported on 12/25/2014 11/19/14   Tatyana Kirichenko, PA-C  polyethylene glycol [facility]) packet Take 17 g by mouth daily. Patient not taking: Reported on 04/10/2015 01/17/15   Charlann Lange, PA-C  prochlorperazine (COMPAZINE) 10 MG tablet Take 1 tablet (10 mg total) by mouth 2 (two) times daily as needed for nausea or vomiting (Nausea ). Patient not taking: Reported on 01/17/2015 12/25/14   Margarita Mail, PA-C  zolpidem (AMBIEN) 10 MG tablet Take 1 tablet (10 mg total) by mouth at bedtime. Patient not taking: Reported on 12/25/2014 09/13/14   [name], NP   BP 139/99 mmHg  Pulse 96  Temp(Src) 97.9 F (36.6 C) (Oral)  Resp 26  SpO2 99% Physical Exam  Constitutional: No distress.  HENT:  Head: Normocephalic and atraumatic.  Right Ear: External ear normal.  Left Ear: External ear normal.  Eyes: Conjunctivae are normal. Right eye exhibits no discharge. Left eye exhibits no discharge. No scleral icterus.  Neck: Neck supple. No tracheal deviation present.  Cardiovascular: Normal rate, regular rhythm and intact distal pulses.   Pulmonary/Chest: Effort normal and breath sounds normal. No stridor. No respiratory distress. He has no wheezes. He has no rales.  Abdominal: Soft. Bowel sounds are normal. He exhibits no distension. There  is no tenderness. There is no rebound and no guarding.  Musculoskeletal: He exhibits no edema or tenderness.  Neurological: He is alert. No cranial nerve deficit (no facial droop, extraocular movements intact, no slurred speech) or sensory deficit. He exhibits normal muscle tone. He displays no seizure activity. Coordination abnormal.  Speech is slurred  Skin: Skin is warm and dry. No rash noted.  Psychiatric: He has a normal mood and affect.  Nursing note and vitals reviewed.   ED Course  Procedures (including critical care time) Labs Review Labs Reviewed  ACETAMINOPHEN LEVEL - Abnormal; Notable for the following:    Acetaminophen (Tylenol), Serum <10 (*)    All other components within normal  limits  COMPREHENSIVE METABOLIC PANEL - Abnormal; Notable for the following:    BUN 5 (*)    AST 298 (*)    ALT 241 (*)    All other components within normal limits  CBC WITH DIFFERENTIAL/PLATELET - Abnormal; Notable for the following:    WBC 3.5 (*)    Neutro Abs 1.3 (*)    All other components within normal limits  ETHANOL - Abnormal; Notable for the following:    Alcohol, Ethyl (B) 453 (*)    All other components within normal limits  MRSA PCR SCREENING  SALICYLATE LEVEL  BASIC METABOLIC PANEL  CBC  ACETAMINOPHEN LEVEL  HEPATIC FUNCTION PANEL    Imaging Review No results found. I have personally reviewed and evaluated these images and lab results as part of my medical decision-making.   EKG Interpretation   Date/Time:  Thursday April 10 2015 19:58:17 EDT Ventricular Rate:  84 PR Interval:  139 QRS Duration: 80 QT Interval:  367 QTC Calculation: 434 R Axis:   60 Text Interpretation:  Sinus rhythm Baseline wander in lead(s) II III aVF  Since last tracing rate slower Confirmed by KNAPP  MD-J, JON ([id number]) on  04/10/2015 9:15:39 PM       MDM   Final diagnoses:  Drug overdose, intentional self-harm, initial encounter    Patient presents to the emergency room after  a drug overdose and severe alcohol intoxication.  Elevation in lfts associated with his alcohol intoxication.  Poison control contacted.  Will admit for observation, monitoring.  Will need psych evaluation once medically stable.    Dorie Rank, MD 04/11/15 [phone]

## 2015-04-10 NOTE — ED Notes (Addendum)
Pt reports taking "whole bottle" of advil PM today and drinking alcohol; Parents at bedside report spoke with patient between 4-6pm and patient said "he wanted help from alcohol abuse"; Mother sts "there were advil PM pills on the ground and about 15 empty bottles of alcohol"

## 2015-04-10 NOTE — ED Notes (Signed)
Poison control notified- suggest IV fluids, EKG, continuous cardiac monitoring, supportive care; sts they will call back in 2 hrs for lab results

## 2015-04-11 DIAGNOSIS — F10129 Alcohol abuse with intoxication, unspecified: Secondary | ICD-10-CM

## 2015-04-11 DIAGNOSIS — T39312A Poisoning by propionic acid derivatives, intentional self-harm, initial encounter: Secondary | ICD-10-CM

## 2015-04-11 DIAGNOSIS — T1491 Suicide attempt: Secondary | ICD-10-CM

## 2015-04-11 DIAGNOSIS — R45851 Suicidal ideations: Secondary | ICD-10-CM

## 2015-04-11 DIAGNOSIS — T50904A Poisoning by unspecified drugs, medicaments and biological substances, undetermined, initial encounter: Secondary | ICD-10-CM

## 2015-04-11 DIAGNOSIS — I10 Essential (primary) hypertension: Secondary | ICD-10-CM

## 2015-04-11 DIAGNOSIS — F1414 Cocaine abuse with cocaine-induced mood disorder: Secondary | ICD-10-CM

## 2015-04-11 DIAGNOSIS — R740 Nonspecific elevation of levels of transaminase and lactic acid dehydrogenase [LDH]: Secondary | ICD-10-CM

## 2015-04-11 LAB — BASIC METABOLIC PANEL
Anion gap: 11 (ref 5–15)
BUN: 6 mg/dL (ref 6–20)
CHLORIDE: 105 mmol/L (ref 101–111)
CO2: 22 mmol/L (ref 22–32)
CREATININE: 0.82 mg/dL (ref 0.61–1.24)
Calcium: 8.1 mg/dL — ABNORMAL LOW (ref 8.9–10.3)
GFR calc Af Amer: >60 mL/min (ref >60)
GLUCOSE: 90 mg/dL (ref 65–99)
Potassium: 4.1 mmol/L (ref 3.5–5.1)
SODIUM: 138 mmol/L (ref 135–145)

## 2015-04-11 LAB — HEPATIC FUNCTION PANEL
ALBUMIN: 3.1 g/dL — AB (ref 3.5–5.0)
ALT: 190 U/L — ABNORMAL HIGH (ref 17–63)
AST: 194 U/L — AB (ref 15–41)
Alkaline Phosphatase: 52 U/L (ref 38–126)
BILIRUBIN TOTAL: 0.5 mg/dL (ref 0.3–1.2)
Bilirubin, Direct: 0.1 mg/dL (ref 0.1–0.5)
Indirect Bilirubin: 0.4 mg/dL (ref 0.3–0.9)
TOTAL PROTEIN: 5.8 g/dL — AB (ref 6.5–8.1)

## 2015-04-11 LAB — RAPID URINE DRUG SCREEN, HOSP PERFORMED
AMPHETAMINES: NOT DETECTED
Barbiturates: NOT DETECTED
Benzodiazepines: NOT DETECTED
Cocaine: POSITIVE — AB
OPIATES: NOT DETECTED
TETRAHYDROCANNABINOL: NOT DETECTED

## 2015-04-11 LAB — CBC
HCT: 44.1 % (ref 39.0–52.0)
Hemoglobin: 15.3 g/dL (ref 13.0–17.0)
MCH: 29.6 pg (ref 26.0–34.0)
MCHC: 34.7 g/dL (ref 30.0–36.0)
MCV: 85.3 fL (ref 78.0–100.0)
PLATELETS: 132 10*3/uL — AB (ref 150–400)
RBC: 5.17 MIL/uL (ref 4.22–5.81)
RDW: 14.9 % (ref 11.5–15.5)
WBC: 3.6 10*3/uL — AB (ref 4.0–10.5)

## 2015-04-11 LAB — GLUCOSE, CAPILLARY: GLUCOSE-CAPILLARY: 103 mg/dL — AB (ref 65–99)

## 2015-04-11 LAB — MRSA PCR SCREENING: MRSA by PCR: NEGATIVE

## 2015-04-11 LAB — ACETAMINOPHEN LEVEL: Acetaminophen (Tylenol), Serum: <10 ug/mL — ABNORMAL LOW (ref 10–30)

## 2015-04-11 MED ORDER — LORAZEPAM 0.5 MG PO TABS
0.5000 mg | ORAL_TABLET | ORAL | Status: DC | PRN
  Administered 2015-04-11 – 2015-04-12 (×3): 0.5 mg via ORAL
  Filled 2015-04-11 (×3): qty 1

## 2015-04-11 MED ORDER — PANTOPRAZOLE SODIUM 40 MG PO TBEC
40.0000 mg | DELAYED_RELEASE_TABLET | Freq: Two times a day (BID) | ORAL | Status: DC
  Administered 2015-04-11 – 2015-04-13 (×4): 40 mg via ORAL
  Filled 2015-04-11 (×4): qty 1

## 2015-04-11 MED ORDER — KETOROLAC TROMETHAMINE 15 MG/ML IJ SOLN
15.0000 mg | Freq: Once | INTRAMUSCULAR | Status: AC
  Administered 2015-04-11: 15 mg via INTRAVENOUS
  Filled 2015-04-11: qty 1

## 2015-04-11 NOTE — Consult Note (Signed)
[facility] Consult   Reason for Consult:  Alcohol and cocaine intoxication and overdose Referring Physician:  Dr. Ree Kida [name] MRN:  [id number] Principal Diagnosis: Alcohol abuse with intoxication Diagnosis:   Patient Active Problem List   Diagnosis Date Noted  . Alcohol abuse with intoxication [F10.129] 04/11/2015  . Cocaine abuse with cocaine-induced mood disorder [F14.14] 04/11/2015  . Overdose [T50.901A] 04/10/2015  . 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 Severe major depression with psychotic features [F32.3] 09/11/2014  . 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 Suicidal ideation [R45.851]   . Tylenol overdose [T39.1X4A]   . Polysubstance abuse [F19.10]   . Overdose of acetaminophen [T39.1X4A] 08/03/2014  . Overdose by acetaminophen [T39.1X4A] 08/03/2014  . Cocaine abuse [F14.10] 04/16/2014  . Alcohol dependence with withdrawal [F10.239] 04/15/2014  . Thrombocytopenia [D69.6] 04/15/2014  . Urinary tract infection, site not specified [N39.0] 04/15/2014  . Chest pain, unspecified [R07.9] 04/15/2014  . D-dimer, elevated [R79.1] 04/15/2014  . Alcohol abuse [F10.10] 01/29/2014  . Alcohol abuse with alcohol-induced mood disorder [F10.14] 01/28/2014  . Transaminitis [R74.0] 09/24/2013  . Alcohol intoxication [F10.129] 09/22/2013  . Cocaine dependence [F14.20] 09/20/2013  . Alcohol withdrawal [F10.239] 08/02/2013  . 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 Alcohol dependence [F10.20] 03/29/2013  . Seizure [R56.9] 03/15/2013  . Syncope [R55] 01/02/2013  . Leukocytopenia, unspecified [D72.819] 01/02/2013  . Left kidney mass [N28.89] 12/24/2012  . PTSD (post-traumatic stress disorder) [F43.10] 07/06/2012  . Major depressive disorder, recurrent episode [F33.9] 06/29/2012    Class: Acute  . Peripheral vascular disease [I73.9] 01/14/2012  . SEIZURE DISORDER [R56.9] 10/03/2008  . LIVER  FUNCTION TESTS, ABNORMAL [R94.5] 12/29/2007  . HYPERCHOLESTEROLEMIA [E78.0] 03/21/2007  . Essential hypertension [I10] 03/21/2007    Total Time spent with patient: 45 minutes  Subjective:   [name] is a 34 y.o. male patient admitted with substance abuse and overdose.  HPI:  [name] is a 34 y.o. male, seen and chart reviewed for psych consultation and evaluation. Patient stated that he has been relapsed of alcohol over four weeks ago and cocaine yesterday. 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 He used to stay with his grand parents and lately staying with his friends who is paying for his alcohol and cocaine and also stated that he is stealing or shop lifting. He has been partially compliant with his medication management from [facility]. Patient endorses intentional overdose with 9 pills of Advil PM with intent to end all his problems but denied currently being suicidal. The patient reports it was Tylenol PM however the bottle was that of Advil. Patient has several previous [facility] admissions for similar clinical conditions. Patient endorses substance abuse, mild intoxication, intention overdose, depression, anxiety, multiple psychosocial stresses. Patient denies current suicidal/homicidal ideation, intention or plans. Patient has no evidence of psychosis. Patient would like to contact with the social service regarding possible placement needs. Patient will be willing to follow up with outpatient medication management when discharged from the hospital. Patient is also provided his mother's contact number for collateral information. Patient has elevated blood alcohol level at 453 on arrival and urine drug screen is positive for cocaine and also elevated liver function test. Patient was offered substance abuse residential treatment center but patient stated he has been there several times and he would like to go outpatient  substance abuse counseling.  HPI Elements:   Location:  depression and substnac abuse. Quality:  poor. Severity:  relapse of drug of abuse. Timing:  intentional overdose while intoxicated. Duration:  four weeks. Context:  psychosocial stresses.  Past Medical History:  Past Medical History  Diagnosis Date  . Seizures   . Hypertension   . Depression   . Pancreatitis   . Liver cirrhosis   . Coronary artery disease   . Angina   . Shortness of breath   . Headache(784.0)   . Peripheral vascular disease April 2011    Left Pop  . Hypercholesteremia   . Schizophrenia   . Bipolar 1 disorder   . Cancer of kidney dx'd 04/2013    lt nephrectomy  . Breast CA dx'd 2009    bil w/ bil masectomy and oral meds  . H/O suicide attempt 2015    overdose    Past Surgical History  Procedure Laterality Date  . Chest surgery    . Left leg surgery    . Mastectomy    . Left kidney removal    . Breast surgery     Family History:  Family History  Problem Relation Age of Onset  . Stroke Other   . Cancer Other   . Hyperlipidemia Mother   . Hypertension Mother    Social History:  History  Alcohol Use No    Comment: former     History  Drug Use No    Comment: former    Social History   Social History  . Marital Status: Single    Spouse Name: N/A  . Number of Children: N/A  . Years of Education: N/A   Social History Main Topics  . Smoking status: Current Every Day Smoker -- 0.25 packs/day for 10 years    Types: Cigarettes  . Smokeless tobacco: Never Used  . Alcohol Use: No     Comment: former  . Drug Use: No     Comment: former  . Sexual Activity: Yes     Comment: anal   Other Topics Concern  . None   Social History Narrative   Additional Social History:                          Allergies:   Allergies  Allergen Reactions  . Codeine Hives, Itching and Swelling  . Depakote [Divalproex Sodium] Other (See Comments)    "Bug out and hallucinate"  .  Morphine Itching  . Penicillins Swelling  . Oxycodone Itching and Swelling  . Hydrocodone-Acetaminophen Itching and Rash    Labs:  Results for orders placed or performed during the hospital encounter of 04/10/15 (from the past 48 hour(s))  Glucose, capillary     Status: Abnormal   Collection Time: 04/10/15  7:56 PM  Result Value Ref Range   Glucose-Capillary 103 (H) 65 - 99 mg/dL  Acetaminophen level     Status: Abnormal   Collection Time: 04/10/15  8:23 PM  Result Value Ref Range   Acetaminophen (Tylenol), Serum <10 (L) 10 - 30 ug/mL    Comment:        THERAPEUTIC CONCENTRATIONS VARY SIGNIFICANTLY. A RANGE OF 10-30 ug/mL MAY BE AN EFFECTIVE CONCENTRATION FOR MANY PATIENTS. HOWEVER, SOME ARE BEST TREATED AT CONCENTRATIONS OUTSIDE THIS RANGE. ACETAMINOPHEN CONCENTRATIONS >150 ug/mL AT 4 HOURS AFTER INGESTION AND >50 ug/mL AT 12 HOURS AFTER INGESTION ARE OFTEN ASSOCIATED WITH TOXIC REACTIONS.   Comprehensive metabolic panel     Status: Abnormal  Collection Time: 04/10/15  8:23 PM  Result Value Ref Range   Sodium 141 135 - 145 mmol/L   Potassium 3.8 3.5 - 5.1 mmol/L   Chloride 103 101 - 111 mmol/L   CO2 25 22 - 32 mmol/L   Glucose, Bld 99 65 - 99 mg/dL   BUN 5 (L) 6 - 20 mg/dL   Creatinine, Ser 0.81 0.61 - 1.24 mg/dL   Calcium 8.9 8.9 - 10.3 mg/dL   Total Protein 7.1 6.5 - 8.1 g/dL   Albumin 3.9 3.5 - 5.0 g/dL   AST 298 (H) 15 - 41 U/L   ALT 241 (H) 17 - 63 U/L   Alkaline Phosphatase 68 38 - 126 U/L   Total Bilirubin 0.4 0.3 - 1.2 mg/dL   GFR calc non Af Amer >60 >60 mL/min   GFR calc Af Amer >60 >60 mL/min    Comment: (NOTE) The eGFR has been calculated using the CKD EPI equation. This calculation has not been validated in all clinical situations. eGFR's persistently <60 mL/min signify possible Chronic Kidney Disease.    Anion gap 13 5 - 15  CBC WITH DIFFERENTIAL     Status: Abnormal   Collection Time: 04/10/15  8:23 PM  Result Value Ref Range   WBC 3.5 (L)  4.0 - 10.5 K/uL   RBC 5.56 4.22 - 5.81 MIL/uL   Hemoglobin 16.4 13.0 - 17.0 g/dL   HCT 46.4 39.0 - 52.0 %   MCV 83.5 78.0 - 100.0 fL   MCH 29.5 26.0 - 34.0 pg   MCHC 35.3 30.0 - 36.0 g/dL   RDW 14.4 11.5 - 15.5 %   Platelets 156 150 - 400 K/uL   Neutrophils Relative % 37 %   Neutro Abs 1.3 (L) 1.7 - 7.7 K/uL   Lymphocytes Relative 54 %   Lymphs Abs 1.9 0.7 - 4.0 K/uL   Monocytes Relative 7 %   Monocytes Absolute 0.3 0.1 - 1.0 K/uL   Eosinophils Relative 2 %   Eosinophils Absolute 0.1 0.0 - 0.7 K/uL   Basophils Relative 0 %   Basophils Absolute 0.0 0.0 - 0.1 K/uL  Ethanol     Status: Abnormal   Collection Time: 04/10/15  8:23 PM  Result Value Ref Range   Alcohol, Ethyl (B) 453 (HH) <5 mg/dL    Comment:        LOWEST DETECTABLE LIMIT FOR SERUM ALCOHOL IS 5 mg/dL FOR MEDICAL PURPOSES ONLY CRITICAL RESULT CALLED TO, READ BACK BY AND VERIFIED WITHPage [employer] [id number]   Salicylate level     Status: None   Collection Time: 04/10/15  8:23 PM  Result Value Ref Range   Salicylate Lvl <6.6 2.8 - 30.0 mg/dL  MRSA PCR Screening     Status: None   Collection Time: 04/11/15 12:36 AM  Result Value Ref Range   MRSA by PCR NEGATIVE NEGATIVE    Comment:        The GeneXpert MRSA Assay (FDA approved for NASAL specimens only), is one component of a comprehensive MRSA colonization surveillance program. It is not intended to diagnose MRSA infection nor to guide or monitor treatment for MRSA infections.   Basic metabolic panel     Status: Abnormal   Collection Time: 04/11/15  3:14 AM  Result Value Ref Range   Sodium 138 135 - 145 mmol/L   Potassium 4.1 3.5 - 5.1 mmol/L    Comment: HEMOLYSIS AT THIS LEVEL MAY AFFECT RESULT   Chloride 105  101 - 111 mmol/L   CO2 22 22 - 32 mmol/L   Glucose, Bld 90 65 - 99 mg/dL   BUN 6 6 - 20 mg/dL   Creatinine, Ser 0.82 0.61 - 1.24 mg/dL   Calcium 8.1 (L) 8.9 - 10.3 mg/dL   GFR calc non Af Amer >60 >60 mL/min   GFR calc Af Amer >60 >60  mL/min    Comment: (NOTE) The eGFR has been calculated using the CKD EPI equation. This calculation has not been validated in all clinical situations. eGFR's persistently <60 mL/min signify possible Chronic Kidney Disease.    Anion gap 11 5 - 15  CBC     Status: Abnormal   Collection Time: 04/11/15  3:14 AM  Result Value Ref Range   WBC 3.6 (L) 4.0 - 10.5 K/uL   RBC 5.17 4.22 - 5.81 MIL/uL   Hemoglobin 15.3 13.0 - 17.0 g/dL   HCT 44.1 39.0 - 52.0 %   MCV 85.3 78.0 - 100.0 fL   MCH 29.6 26.0 - 34.0 pg   MCHC 34.7 30.0 - 36.0 g/dL   RDW 14.9 11.5 - 15.5 %   Platelets 132 (L) 150 - 400 K/uL  Acetaminophen level     Status: Abnormal   Collection Time: 04/11/15  3:14 AM  Result Value Ref Range   Acetaminophen (Tylenol), Serum <10 (L) 10 - 30 ug/mL    Comment:        THERAPEUTIC CONCENTRATIONS VARY SIGNIFICANTLY. A RANGE OF 10-30 ug/mL MAY BE AN EFFECTIVE CONCENTRATION FOR MANY PATIENTS. HOWEVER, SOME ARE BEST TREATED AT CONCENTRATIONS OUTSIDE THIS RANGE. ACETAMINOPHEN CONCENTRATIONS >150 ug/mL AT 4 HOURS AFTER INGESTION AND >50 ug/mL AT 12 HOURS AFTER INGESTION ARE OFTEN ASSOCIATED WITH TOXIC REACTIONS.   Urine rapid drug screen (hosp performed)     Status: Abnormal   Collection Time: 04/11/15  7:54 AM  Result Value Ref Range   Opiates NONE DETECTED NONE DETECTED   Cocaine POSITIVE (A) NONE DETECTED   Benzodiazepines NONE DETECTED NONE DETECTED   Amphetamines NONE DETECTED NONE DETECTED   Tetrahydrocannabinol NONE DETECTED NONE DETECTED   Barbiturates NONE DETECTED NONE DETECTED    Comment:        DRUG SCREEN FOR MEDICAL PURPOSES ONLY.  IF CONFIRMATION IS NEEDED FOR ANY PURPOSE, NOTIFY LAB WITHIN 5 DAYS.        LOWEST DETECTABLE LIMITS FOR URINE DRUG SCREEN Drug Class       Cutoff (ng/mL) Amphetamine      1000 Barbiturate      200 Benzodiazepine   101 Tricyclics       751 Opiates          300 Cocaine          300 THC              50   Hepatic function panel      Status: Abnormal   Collection Time: 04/11/15  8:50 AM  Result Value Ref Range   Total Protein 5.8 (L) 6.5 - 8.1 g/dL   Albumin 3.1 (L) 3.5 - 5.0 g/dL   AST 194 (H) 15 - 41 U/L   ALT 190 (H) 17 - 63 U/L   Alkaline Phosphatase 52 38 - 126 U/L   Total Bilirubin 0.5 0.3 - 1.2 mg/dL   Bilirubin, Direct 0.1 0.1 - 0.5 mg/dL   Indirect Bilirubin 0.4 0.3 - 0.9 mg/dL    Vitals: Blood pressure 115/87, pulse 70, temperature 98.2 F (36.8 C), temperature source Oral,  resp. rate 17, height _0  (1.676 m), weight 71 kg (156 lb 8.4 oz), SpO2 97 %.  Risk to Self: Is patient at risk for suicide?: Yes (Pt states he no longer wants to hurt himself) Risk to Others:   Prior Inpatient Therapy:   Prior Outpatient Therapy:    Current Facility-Administered Medications  Medication Dose Route Frequency Provider Last Rate Last Dose  . 0.9 %  sodium chloride infusion  1,000 mL Intravenous Continuous Dorie Rank, MD 125 mL/hr at 04/10/15 2100 1,000 mL at 04/10/15 2100  . 0.9 %  sodium chloride infusion   Intravenous Continuous Merton Border, MD 100 mL/hr at 04/11/15 0600    . amLODipine (NORVASC) tablet 5 mg  5 mg Oral Daily Merton Border, MD   5 mg at 04/11/15 1023  . ARIPiprazole (ABILIFY) tablet 10 mg  10 mg Oral Daily Merton Border, MD   10 mg at 04/11/15 1025  . dicyclomine (BENTYL) capsule 10 mg  10 mg Oral TID AC & HS Merton Border, MD   10 mg at 04/11/15 0747  . enoxaparin (LOVENOX) injection 40 mg  40 mg Subcutaneous QHS Merton Border, MD   40 mg at 04/11/15 0000  . folic acid (FOLVITE) tablet 1 mg  1 mg Oral Daily Merton Border, MD   1 mg at 04/11/15 1024  . gabapentin (NEURONTIN) capsule 200 mg  200 mg Oral TID WC Merton Border, MD   200 mg at 04/11/15 1024  . gabapentin (NEURONTIN) capsule 300 mg  300 mg Oral QHS Merton Border, MD   300 mg at 04/11/15 0000  . LORazepam (ATIVAN) tablet 1 mg  1 mg Oral Q6H PRN Merton Border, MD       Or  . LORazepam (ATIVAN) injection 1 mg  1 mg Intravenous Q6H PRN Merton Border, MD      .  LORazepam (ATIVAN) tablet 0.5 mg  0.5 mg Oral Q4H PRN Maryann Mikhail, DO   0.5 mg at 04/11/15 1023  . metoCLOPramide (REGLAN) tablet 10 mg  10 mg Oral 4 times per day Merton Border, MD   10 mg at 04/11/15 0600  . metoprolol tartrate (LOPRESSOR) tablet 25 mg  25 mg Oral BID Merton Border, MD   25 mg at 04/11/15 1023  . multivitamin with minerals tablet 1 tablet  1 tablet Oral Daily Merton Border, MD   1 tablet at 04/11/15 1024  . ondansetron (ZOFRAN) tablet 4 mg  4 mg Oral Q6H PRN Merton Border, MD   4 mg at 04/11/15 0747   Or  . ondansetron (ZOFRAN) injection 4 mg  4 mg Intravenous Q6H PRN Merton Border, MD      . pantoprazole (PROTONIX) injection 40 mg  40 mg Intravenous Q12H Merton Border, MD   40 mg at 04/11/15 1023  . polyethylene glycol (MIRALAX / GLYCOLAX) packet 17 g  17 g Oral Daily Merton Border, MD   17 g at 04/11/15 1023  . prochlorperazine (COMPAZINE) tablet 10 mg  10 mg Oral BID PRN Merton Border, MD      . sodium chloride 0.9 % injection 3 mL  3 mL Intravenous Q12H Merton Border, MD   3 mL at 04/11/15 1026  . thiamine (VITAMIN B-1) tablet 100 mg  100 mg Oral Daily Merton Border, MD   100 mg at 04/11/15 1023   Or  . thiamine (B-1) injection 100 mg  100 mg Intravenous Daily Merton Border, MD        Musculoskeletal: Strength &  Muscle Tone: within normal limits Gait & Station: normal Patient leans: N/A  Psychiatric Specialty Exam: Physical Exam as per history and physical  ROS abdominal pain, nausea and vomiting but denied chest pain and SOB No Fever-chills, No Headache, No changes with Vision or hearing, reports vertigo No problems swallowing food or Liquids, No Chest pain, Cough or Shortness of Breath, No Abdominal pain, No Nausea or Vommitting, Bowel movements are regular, No Blood in stool or Urine, No dysuria, No new skin rashes or bruises, No new joints pains-aches,  No new weakness, tingling, numbness in any extremity, No recent weight gain or loss, No polyuria, polydypsia or polyphagia,   A  full 10 point Review of Systems was done, except as stated above, all other Review of Systems were negative.  Blood pressure 115/87, pulse 70, temperature 98.2 F (36.8 C), temperature source Oral, resp. rate 17, height _0  (1.676 m), weight 71 kg (156 lb 8.4 oz), SpO2 97 %.Body mass index is 25.28 kg/(m^2).  General Appearance: Casual  Eye Contact::  Good  Speech:  Clear and Coherent  Volume:  Decreased  Mood:  Depressed  Affect:  Appropriate and Congruent  Thought Process:  Coherent and Goal Directed  Orientation:  Full (Time, Place, and Person)  Thought Content:  Rumination  Suicidal Thoughts:  No  Homicidal Thoughts:  No  Memory:  Immediate;   Fair Recent;   Fair  Judgement:  Impaired  Insight:  Fair  Psychomotor Activity:  Decreased  Concentration:  Good  Recall:  Good  Fund of Knowledge:Good  Language: Good  Akathisia:  Negative  Handed:  Right  AIMS (if indicated):     Assets:  Communication Skills Desire for Improvement Leisure Time Resilience Social Support Talents/Skills  ADL's:  Intact  Cognition: WNL  Sleep:      Medical Decision Making: Review of Psycho-Social Stressors (1), Review or order clinical lab tests (1), Established Problem, Worsening (2), Review of Last Therapy Session (1), Review or order medicine tests (1), Review of Medication Regimen & Side Effects (2) and Review of New Medication or Change in Dosage (2)  Treatment Plan Summary: Daily contact with patient to assess and evaluate symptoms and progress in treatment and Medication management Continue current psych appropriate medication without changes Spoke with psych social service regarding collateral and safety disposition plans Continue Safety sitter due to intentional overdose while intoxicated and needs further evaluation for safety concerns. Monitor for alcohol withdrawal symptoms and continue ativan detox Patient does not meet criteria for psychiatric inpatient admission. Supportive  therapy provided about ongoing stressors. Refer to IOP.  Disposition: Patient will be referred to [place] as he is working and not interested in residential treatment.  JONNALAGADDA,JANARDHAHA R. 04/11/2015 11:48 AM

## 2015-04-11 NOTE — Progress Notes (Signed)
[facility]                                                                              Patient Demographics  [name], is a 33 y.o. male, DOB - 05-Dec-1980, FWY:[id number]  Admit date - 04/10/2015   Admitting Physician Merton Border, MD  Outpatient Primary MD for the patient is [facility]  LOS - 1   Chief Complaint  Patient presents with  . Drug Overdose      HPI on 04/10/2015 by Dr. Merton Border [name] is a 34 y.o. male, past medical history significant for schizophrenia and bipolar disorder history of pancreatitis, alcoholism, kidney cancer status post left nephrectomy today to the emergency room for drug overdose with 9 pills of Advil PM. The patient reports it was Tylenol PM however the bottle was that of Advil. In the emergency room his alcohol level was elevated and the patient had altered mental status so we decided to admit him.  Assessment & Plan   Overdose with suicidal ideation/attempt -Continue sitter -Secondary to Tylenol (although upon admission, Advil bottle noted) -Continue to monitor Tylenol levels  Renal cancer S/p Left nephroectomy  -Supposedly in remission  Polysubstance abuse/Alcohol intoxication -Alcohol level upon admission for 53 -Continue CIWA protocolVitamin B, folic acid, multivitamin -Order toxicology screen which showed cocaine positive  Transaminitis -Likely secondary to the above -LFTs improving, will continue to monitor -Hepatitis panel pending  Hypertension -Continue amlodipine, metoprolol  History of bipolar/schizophrenia -Continue Abilify  Code Status: Full  Family Communication: None at bedside  Disposition Plan: Admitted, pending psych consult  Time Spent in minutes   30 minutes  Procedures  None  Consults   Psychiatry   DVT Prophylaxis  Lovenox  Lab Results  Component Value Date   PLT 132* 04/11/2015    Medications  Scheduled Meds: . amLODipine  5 mg Oral Daily  .  ARIPiprazole  10 mg Oral Daily  . dicyclomine  10 mg Oral TID AC & HS  . enoxaparin (LOVENOX) injection  40 mg Subcutaneous QHS  . folic acid  1 mg Oral Daily  . gabapentin  200 mg Oral TID WC  . gabapentin  300 mg Oral QHS  . metoCLOPramide  10 mg Oral 4 times per day  . metoprolol tartrate  25 mg Oral BID  . multivitamin with minerals  1 tablet Oral Daily  . pantoprazole (PROTONIX) IV  40 mg Intravenous Q12H  . polyethylene glycol  17 g Oral Daily  . sodium chloride  3 mL Intravenous Q12H  . thiamine  100 mg Oral Daily   Or  . thiamine  100 mg Intravenous Daily   Continuous Infusions: . sodium chloride 1,000 mL (04/10/15 2100)  . sodium chloride 100 mL/hr at 04/11/15 0600   PRN Meds:.LORazepam **OR** LORazepam, LORazepam, ondansetron **OR** ondansetron (ZOFRAN) IV, prochlorperazine  Antibiotics    Anti-infectives    None      Subjective:   [name] seen and examined today. Patient states he is very depressed and wants to kill himself. He admits to trying to overdose with pills. Patient also states he is homeless. Currently denies chest pain, shortness of breath,  abdominal pain, nausea or vomiting. Does feel shaky..    Objective:   Filed Vitals:   04/10/15 2252 04/11/15 0200 04/11/15 0400 04/11/15 0800  BP: 116/84 115/84 119/85 115/87  Pulse: 89 80 73 70  Temp: 97.6 F (36.4 C)  97.8 F (36.6 C) 98.2 F (36.8 C)  TempSrc: Oral  Oral Oral  Resp: 18 17 20 17   Height: 5\' 6"  (1.676 m)     Weight: 71 kg (156 lb 8.4 oz)     SpO2: 97% 96% 96% 97%    Wt Readings from Last 3 Encounters:  04/10/15 71 kg (156 lb 8.4 oz)  01/17/15 77.111 kg (170 lb)  11/19/14 76.204 kg (168 lb)     Intake/Output Summary (Last 24 hours) at 04/11/15 1144 Last data filed at 04/11/15 1026  Gross per 24 hour  Intake   1783 ml  Output    850 ml  Net    933 ml    Exam  General: Well developed, well nourished, NAD, appears stated age  80: NCATmucous membranes moist.    Cardiovascular: S1 S2 auscultated, no rubs, murmurs or gallops. Regular rate and rhythm.  Respiratory: Clear to auscultation bilaterally with equal chest rise  Abdomen: Soft, nontender, nondistended, + bowel sounds  Extremities: warm dry without cyanosis clubbing or edema  Neuro: AAOx3, nonfocal  Psych: Depressed   Data Review   Micro Results Recent Results (from the past 240 hour(s))  MRSA PCR Screening     Status: None   Collection Time: 04/11/15 12:36 AM  Result Value Ref Range Status   MRSA by PCR NEGATIVE NEGATIVE Final    Comment:        The GeneXpert MRSA Assay (FDA approved for NASAL specimens only), is one component of a comprehensive MRSA colonization surveillance program. It is not intended to diagnose MRSA infection nor to guide or monitor treatment for MRSA infections.     Radiology Reports No results found.  CBC  Recent Labs Lab 04/10/15 2023 04/11/15 0314  WBC 3.5* 3.6*  HGB 16.4 15.3  HCT 46.4 44.1  PLT 156 132*  MCV 83.5 85.3  MCH 29.5 29.6  MCHC 35.3 34.7  RDW 14.4 14.9  LYMPHSABS 1.9  --   MONOABS 0.3  --   EOSABS 0.1  --   BASOSABS 0.0  --     Chemistries   Recent Labs Lab 04/10/15 2023 04/11/15 0314 04/11/15 0850  NA 141 138  --   K 3.8 4.1  --   CL 103 105  --   CO2 25 22  --   GLUCOSE 99 90  --   BUN 5* 6  --   CREATININE 0.81 0.82  --   CALCIUM 8.9 8.1*  --   AST 298*  --  194*  ALT 241*  --  190*  ALKPHOS 68  --  52  BILITOT 0.4  --  0.5   ------------------------------------------------------------------------------------------------------------------ estimated creatinine clearance is 113.5 mL/min (by C-G formula based on Cr of 0.82). ------------------------------------------------------------------------------------------------------------------ No results for input(s): HGBA1C in the last 72  hours. ------------------------------------------------------------------------------------------------------------------ No results for input(s): CHOL, HDL, LDLCALC, TRIG, CHOLHDL, LDLDIRECT in the last 72 hours. ------------------------------------------------------------------------------------------------------------------ No results for input(s): TSH, T4TOTAL, T3FREE, THYROIDAB in the last 72 hours.  Invalid input(s): FREET3 ------------------------------------------------------------------------------------------------------------------ No results for input(s): VITAMINB12, FOLATE, FERRITIN, TIBC, IRON, RETICCTPCT in the last 72 hours.  Coagulation profile No results for input(s): INR, PROTIME in the last 168 hours.  No results for input(s): DDIMER in  the last 72 hours.  Cardiac Enzymes No results for input(s): CKMB, TROPONINI, MYOGLOBIN in the last 168 hours.  Invalid input(s): CK ------------------------------------------------------------------------------------------------------------------ Invalid input(s): POCBNP    MIKHAIL, MARYANN D.O. on 04/11/2015 at 11:44 AM  Between 7am to 7pm - Pager - [phone]  After 7pm go to [email or web address] - password [email or web address]  And look for the night coverage person covering for me after hours  [facility] Office  [phone]

## 2015-04-11 NOTE — Clinical Social Work Psych Note (Signed)
Psych CSW was consulted to contact collaterals.  Psych CSW attempted to contact patient's mother, Kalman Shan at [phone].  The phone rang, without answer and no voice mail available.  Psych CSW will re-attempt contact with mother for collateral information.  Nonnie Done, LCSW [phone]  Psychiatric & Orthopedics (5N 1-8) Clinical Social Worker

## 2015-04-12 DIAGNOSIS — T50902A Poisoning by unspecified drugs, medicaments and biological substances, intentional self-harm, initial encounter: Secondary | ICD-10-CM

## 2015-04-12 LAB — HEPATITIS PANEL, ACUTE
HCV Ab: <0.1 s/co ratio (ref 0.0–0.9)
HEP B C IGM: NEGATIVE
HEP B S AG: NEGATIVE
Hep A IgM: NEGATIVE

## 2015-04-12 LAB — COMPREHENSIVE METABOLIC PANEL
ALBUMIN: 3 g/dL — AB (ref 3.5–5.0)
ALT: 151 U/L — ABNORMAL HIGH (ref 17–63)
AST: 125 U/L — AB (ref 15–41)
Alkaline Phosphatase: 55 U/L (ref 38–126)
Anion gap: 7 (ref 5–15)
BUN: 10 mg/dL (ref 6–20)
CO2: 29 mmol/L (ref 22–32)
Calcium: 8.7 mg/dL — ABNORMAL LOW (ref 8.9–10.3)
Chloride: 101 mmol/L (ref 101–111)
Creatinine, Ser: 0.92 mg/dL (ref 0.61–1.24)
GFR calc Af Amer: >60 mL/min (ref >60)
GFR calc non Af Amer: >60 mL/min (ref >60)
GLUCOSE: 73 mg/dL (ref 65–99)
POTASSIUM: 3.9 mmol/L (ref 3.5–5.1)
Sodium: 137 mmol/L (ref 135–145)
Total Bilirubin: 0.5 mg/dL (ref 0.3–1.2)
Total Protein: 5.8 g/dL — ABNORMAL LOW (ref 6.5–8.1)

## 2015-04-12 LAB — CBC
HEMATOCRIT: 43.7 % (ref 39.0–52.0)
Hemoglobin: 15 g/dL (ref 13.0–17.0)
MCH: 28.8 pg (ref 26.0–34.0)
MCHC: 34.3 g/dL (ref 30.0–36.0)
MCV: 84 fL (ref 78.0–100.0)
PLATELETS: 123 10*3/uL — AB (ref 150–400)
RBC: 5.2 MIL/uL (ref 4.22–5.81)
RDW: 14.2 % (ref 11.5–15.5)
WBC: 3.1 10*3/uL — AB (ref 4.0–10.5)

## 2015-04-12 LAB — ACETAMINOPHEN LEVEL: Acetaminophen (Tylenol), Serum: <10 ug/mL — ABNORMAL LOW (ref 10–30)

## 2015-04-12 MED ORDER — DIPHENHYDRAMINE HCL 25 MG PO CAPS
25.0000 mg | ORAL_CAPSULE | Freq: Once | ORAL | Status: AC
  Administered 2015-04-12: 25 mg via ORAL
  Filled 2015-04-12: qty 1

## 2015-04-12 NOTE — Progress Notes (Signed)
[facility]                                                                              Patient Demographics  [name], is a 34 y.o. male, DOB - Jul 06, 1981, [id number]  Admit date - 04/10/2015   Admitting Physician Merton Border, MD  Outpatient Primary MD for the patient is [facility]  LOS - 2   Chief Complaint  Patient presents with  . Drug Overdose      HPI on 04/10/2015 by Dr. Merton Border [name] is a 34 y.o. male, past medical history significant for schizophrenia and bipolar disorder history of pancreatitis, alcoholism, kidney cancer status post left nephrectomy today to the emergency room for drug overdose with 9 pills of Advil PM. The patient reports it was Tylenol PM however the bottle was that of Advil. In the emergency room his alcohol level was elevated and the patient had altered mental status so we decided to admit him.  Assessment & Plan   Overdose with suicidal ideation/attempt -Continue sitter -Secondary to Tylenol (although upon admission, Advil bottle noted) -Acetaminophen level less than 10 -Psychiatry consulted and appreciated. -Patient evaluated on 04/11/2015 by Dr. Louretta Shorten, at that time supposedly patient denied intentions of suicide or thoughts. It was not felt that patient met criteria for psychiatric inpatient admission. -Upon my evaluation today, patient continues to voice thoughts of suicide with intention. -Have asked for psychiatry to reevaluate patient.  Renal cancer S/p Left nephroectomy  -Supposedly in remission  Polysubstance abuse/Alcohol intoxication -Alcohol level upon admission 453 -Continue CIWA protocolVitamin B, folic acid, multivitamin -Order toxicology screen which showed cocaine positive  Transaminitis -Likely secondary to the above -LFTs improving, will continue to monitor -Hepatitis panel unremarkable  Hypertension -Continue amlodipine, metoprolol  History of  bipolar/schizophrenia -Continue Abilify  Code Status: Full  Family Communication: None at bedside  Disposition Plan: Admitted, have asked psych to reevaluate, as patient still has thoughts of suicide  Time Spent in minutes   30 minutes  Procedures  None  Consults   Psychiatry   DVT Prophylaxis  Lovenox  Lab Results  Component Value Date   PLT 123* 04/12/2015    Medications  Scheduled Meds: . amLODipine  5 mg Oral Daily  . ARIPiprazole  10 mg Oral Daily  . dicyclomine  10 mg Oral TID AC & HS  . enoxaparin (LOVENOX) injection  40 mg Subcutaneous QHS  . folic acid  1 mg Oral Daily  . gabapentin  200 mg Oral TID WC  . gabapentin  300 mg Oral QHS  . metoCLOPramide  10 mg Oral 4 times per day  . metoprolol tartrate  25 mg Oral BID  . multivitamin with minerals  1 tablet Oral Daily  . pantoprazole  40 mg Oral BID  . polyethylene glycol  17 g Oral Daily  . sodium chloride  3 mL Intravenous Q12H  . thiamine  100 mg Oral Daily   Or  . thiamine  100 mg Intravenous Daily   Continuous Infusions: . sodium chloride 1,000 mL (04/10/15 2100)  . sodium chloride 100 mL/hr at 04/11/15 2208   PRN Meds:.LORazepam **OR** LORazepam, LORazepam, ondansetron **OR**  ondansetron (ZOFRAN) IV, prochlorperazine  Antibiotics    Anti-infectives    None      Subjective:   [name] seen and examined today. Patient continues to state he has thoughts of suicide. Currently denies chest pain, shortness of breath, abdominal pain, nausea or vomiting.  Objective:   Filed Vitals:   04/12/15 0400 04/12/15 0600 04/12/15 0700 04/12/15 0835  BP: 119/78 104/72  127/81  Pulse: 61 61  58  Temp: 98.2 F (36.8 C)  98.6 F (37 C)   TempSrc: Oral  Oral   Resp: '16 17  15  ' Height:      Weight:      SpO2: 98% 99%  98%    Wt Readings from Last 3 Encounters:  04/10/15 71 kg (156 lb 8.4 oz)  01/17/15 77.111 kg (170 lb)  11/19/14 76.204 kg (168 lb)     Intake/Output Summary (Last 24 hours)  at 04/12/15 1115 Last data filed at 04/12/15 0388  Gross per 24 hour  Intake   1440 ml  Output   2225 ml  Net   -785 ml    Exam  General: Well developed, well nourished, NAD, appears stated age  80: NCATmucous membranes moist.   Cardiovascular: S1 S2 auscultated, no rubs, murmurs or gallops. Regular rate and rhythm.  Respiratory: Clear to auscultation bilaterally with equal chest rise  Abdomen: Soft, nontender, nondistended, + bowel sounds  Extremities: warm dry without cyanosis clubbing or edema  Neuro: AAOx3, nonfocal  Psych: Depressed   Data Review   Micro Results Recent Results (from the past 240 hour(s))  MRSA PCR Screening     Status: None   Collection Time: 04/11/15 12:36 AM  Result Value Ref Range Status   MRSA by PCR NEGATIVE NEGATIVE Final    Comment:        The GeneXpert MRSA Assay (FDA approved for NASAL specimens only), is one component of a comprehensive MRSA colonization surveillance program. It is not intended to diagnose MRSA infection nor to guide or monitor treatment for MRSA infections.     Radiology Reports No results found.  CBC  Recent Labs Lab 04/10/15 2023 04/11/15 0314 04/12/15 0243  WBC 3.5* 3.6* 3.1*  HGB 16.4 15.3 15.0  HCT 46.4 44.1 43.7  PLT 156 132* 123*  MCV 83.5 85.3 84.0  MCH 29.5 29.6 28.8  MCHC 35.3 34.7 34.3  RDW 14.4 14.9 14.2  LYMPHSABS 1.9  --   --   MONOABS 0.3  --   --   EOSABS 0.1  --   --   BASOSABS 0.0  --   --     Chemistries   Recent Labs Lab 04/10/15 2023 04/11/15 0314 04/11/15 0850 04/12/15 0243  NA 141 138  --  137  K 3.8 4.1  --  3.9  CL 103 105  --  101  CO2 25 22  --  29  GLUCOSE 99 90  --  73  BUN 5* 6  --  10  CREATININE 0.81 0.82  --  0.92  CALCIUM 8.9 8.1*  --  8.7*  AST 298*  --  194* 125*  ALT 241*  --  190* 151*  ALKPHOS 68  --  52 55  BILITOT 0.4  --  0.5 0.5    ------------------------------------------------------------------------------------------------------------------ estimated creatinine clearance is 101.1 mL/min (by C-G formula based on Cr of 0.92). ------------------------------------------------------------------------------------------------------------------ No results for input(s): HGBA1C in the last 72 hours. ------------------------------------------------------------------------------------------------------------------ No results for input(s): CHOL, HDL, LDLCALC, TRIG,  CHOLHDL, LDLDIRECT in the last 72 hours. ------------------------------------------------------------------------------------------------------------------ No results for input(s): TSH, T4TOTAL, T3FREE, THYROIDAB in the last 72 hours.  Invalid input(s): FREET3 ------------------------------------------------------------------------------------------------------------------ No results for input(s): VITAMINB12, FOLATE, FERRITIN, TIBC, IRON, RETICCTPCT in the last 72 hours.  Coagulation profile No results for input(s): INR, PROTIME in the last 168 hours.  No results for input(s): DDIMER in the last 72 hours.  Cardiac Enzymes No results for input(s): CKMB, TROPONINI, MYOGLOBIN in the last 168 hours.  Invalid input(s): CK ------------------------------------------------------------------------------------------------------------------ Invalid input(s): POCBNP    Amiliana Foutz D.O. on 04/12/2015 at 11:15 AM  Between 7am to 7pm - Pager - [phone]  After 7pm go to [email or web address] - password [email or web address]  And look for the night coverage person covering for me after hours  [facility] Office  [phone]

## 2015-04-12 NOTE — Progress Notes (Signed)
Utilization review completed.  

## 2015-04-12 NOTE — Progress Notes (Signed)
Pt requesting comfortably this am, vital signs stable, pt did state he is still having thoughts suicidal ideation, MD Makail aware. Will continue to monitor.

## 2015-04-13 ENCOUNTER — Inpatient Hospital Stay (HOSPITAL_COMMUNITY): Payer: No Typology Code available for payment source

## 2015-04-13 DIAGNOSIS — F1910 Other psychoactive substance abuse, uncomplicated: Secondary | ICD-10-CM

## 2015-04-13 DIAGNOSIS — Z905 Acquired absence of kidney: Secondary | ICD-10-CM

## 2015-04-13 LAB — CBC
HCT: 42.3 % (ref 39.0–52.0)
HEMOGLOBIN: 15 g/dL (ref 13.0–17.0)
MCH: 29.9 pg (ref 26.0–34.0)
MCHC: 35.5 g/dL (ref 30.0–36.0)
MCV: 84.4 fL (ref 78.0–100.0)
PLATELETS: 118 10*3/uL — AB (ref 150–400)
RBC: 5.01 MIL/uL (ref 4.22–5.81)
RDW: 14.5 % (ref 11.5–15.5)
WBC: 3.8 10*3/uL — ABNORMAL LOW (ref 4.0–10.5)

## 2015-04-13 LAB — HEPATIC FUNCTION PANEL
ALBUMIN: 2.9 g/dL — AB (ref 3.5–5.0)
ALK PHOS: 57 U/L (ref 38–126)
ALT: 193 U/L — AB (ref 17–63)
AST: 211 U/L — AB (ref 15–41)
Bilirubin, Direct: 0.1 mg/dL (ref 0.1–0.5)
Indirect Bilirubin: 0.2 mg/dL — ABNORMAL LOW (ref 0.3–0.9)
TOTAL PROTEIN: 5.6 g/dL — AB (ref 6.5–8.1)
Total Bilirubin: 0.3 mg/dL (ref 0.3–1.2)

## 2015-04-13 LAB — COMPREHENSIVE METABOLIC PANEL
ALBUMIN: 2.9 g/dL — AB (ref 3.5–5.0)
ALK PHOS: 58 U/L (ref 38–126)
ALT: 194 U/L — ABNORMAL HIGH (ref 17–63)
ANION GAP: 7 (ref 5–15)
AST: 206 U/L — ABNORMAL HIGH (ref 15–41)
BUN: 8 mg/dL (ref 6–20)
CALCIUM: 8.4 mg/dL — AB (ref 8.9–10.3)
CHLORIDE: 104 mmol/L (ref 101–111)
CO2: 25 mmol/L (ref 22–32)
Creatinine, Ser: 0.95 mg/dL (ref 0.61–1.24)
GFR calc Af Amer: >60 mL/min (ref >60)
GFR calc non Af Amer: >60 mL/min (ref >60)
GLUCOSE: 142 mg/dL — AB (ref 65–99)
POTASSIUM: 3.5 mmol/L (ref 3.5–5.1)
SODIUM: 136 mmol/L (ref 135–145)
Total Bilirubin: 0.4 mg/dL (ref 0.3–1.2)
Total Protein: 5.6 g/dL — ABNORMAL LOW (ref 6.5–8.1)

## 2015-04-13 MED ORDER — THIAMINE HCL 100 MG PO TABS: 100.0000 mg | ORAL_TABLET | Freq: Every day | ORAL | Status: DC

## 2015-04-13 NOTE — Discharge Instructions (Signed)
Alcohol Intoxication  Alcohol intoxication occurs when the amount of alcohol that a person has consumed impairs his or her ability to mentally and physically function. Alcohol directly impairs the normal chemical activity of the brain. Drinking large amounts of alcohol can lead to changes in mental function and behavior, and it can cause many physical effects that can be harmful.   Alcohol intoxication can range in severity from mild to very severe. Various factors can affect the level of intoxication that occurs, such as the person's age, gender, weight, frequency of alcohol consumption, and the presence of other medical conditions (such as diabetes, seizures, or heart conditions). Dangerous levels of alcohol intoxication may occur when people drink large amounts of alcohol in a short period (binge drinking). Alcohol can also be especially dangerous when combined with certain prescription medicines or "recreational" drugs.  SIGNS AND SYMPTOMS  Some common signs and symptoms of mild alcohol intoxication include:  · Loss of coordination.  · Changes in mood and behavior.  · Impaired judgment.  · Slurred speech.  As alcohol intoxication progresses to more severe levels, other signs and symptoms will appear. These may include:  · Vomiting.  · Confusion and impaired memory.  · Slowed breathing.  · Seizures.  · Loss of consciousness.  DIAGNOSIS   Your health care provider will take a medical history and perform a physical exam. You will be asked about the amount and type of alcohol you have consumed. Blood tests will be done to measure the concentration of alcohol in your blood. In many places, your blood alcohol level must be lower than 80 mg/dL (0.08%) to legally drive. However, many dangerous effects of alcohol can occur at much lower levels.   TREATMENT   People with alcohol intoxication often do not require treatment. Most of the effects of alcohol intoxication are temporary, and they go away as the alcohol naturally  leaves the body. Your health care provider will monitor your condition until you are stable enough to go home. Fluids are sometimes given through an IV access tube to help prevent dehydration.   HOME CARE INSTRUCTIONS  · Do not drive after drinking alcohol.  · Stay hydrated. Drink enough water and fluids to keep your urine clear or pale yellow. Avoid caffeine.    · Only take over-the-counter or prescription medicines as directed by your health care provider.    SEEK MEDICAL CARE IF:   · You have persistent vomiting.    · You do not feel better after a few days.  · You have frequent alcohol intoxication. Your health care provider can help determine if you should see a substance use treatment counselor.  SEEK IMMEDIATE MEDICAL CARE IF:   · You become shaky or tremble when you try to stop drinking.    · You shake uncontrollably (seizure).    · You throw up (vomit) blood. This may be bright red or may look like black coffee grounds.    · You have blood in your stool. This may be bright red or may appear as a black, tarry, bad smelling stool.    · You become lightheaded or faint.    MAKE SURE YOU:   · Understand these instructions.  · Will watch your condition.  · Will get help right away if you are not doing well or get worse.  Document Released: 04/14/2005 Document Revised: 03/07/2013 Document Reviewed: 12/08/2012  [employer]® Patient Information ©2015 [employer]. This information is not intended to replace advice given to you by your health care provider. Make sure   you discuss any questions you have with your health care provider.

## 2015-04-13 NOTE — Discharge Summary (Signed)
Physician Discharge Summary  [name] MLY:[id number] DOB: 1981/06/11 DOA: 04/10/2015  PCP: [place] date: 04/10/2015 Discharge date: 04/13/2015  Time spent: 45 minutes  Recommendations for Outpatient Follow-up:  Patient will be discharged to home.  Patient will need to follow up with primary care provider within one week of discharge and repeat CMP.  Patient should continue medications as prescribed.  Patient should follow a heart healthy diet.  Avoid illicit drug use and alcohol use.  Recommended drug rehab.   Discharge Diagnoses:   overdose with suicidal ideation/attempt Renal cancer status post left nephrectomy Polysubstance abuse/alcohol intoxication Transaminitis/history of cirrhosis Hypertension next line history of bipolar/schizophrenia  Discharge Condition: Stable  Diet recommendation: heart healthy  Filed Weights   04/10/15 2252  Weight: 71 kg (156 lb 8.4 oz)    History of present illness:  on 04/10/2015 by Dr. Merton Border Clarkson [name] is a 34 y.o. male, past medical history significant for schizophrenia and bipolar disorder history of pancreatitis, alcoholism, kidney cancer status post left nephrectomy today to the emergency room for drug overdose with 9 pills of Advil PM. The patient reports it was Tylenol PM however the bottle was that of Advil. In the emergency room his alcohol level was elevated and the patient had altered mental status so we decided to admit him.  Hospital Course:  Overdose with suicidal ideation/attempt -Patient had safety sitter -Secondary to Tylenol (although upon admission, Advil bottle noted) -Acetaminophen level less than 10 -Psychiatry consulted and appreciated. -Patient evaluated on 04/11/2015 by Dr. Louretta [name], at that time supposedly patient denied intentions of suicide or thoughts. It was not felt that patient met criteria for psychiatric inpatient admission.    Recommended CDIOP. -Spoke with Dr.  Tiburcio Pea on 04/12/2015, who felt the patient was stable and no longer suicidal  -Patient voiced this morning that he was feeling better and no longer having thoughts of suicide or harming himself.  Renal cancer S/p Left nephroectomy  -Supposedly in remission  Polysubstance abuse/Alcohol intoxication -Alcohol level upon admission 453 -During hospitalization, patient was placed on CIWA protocol, Vitamin B, folic acid, multivitamin -toxicology screen which showed cocaine positive -no signs of withdrawal -discussed cessation with patient as well as drug rehabilitation  Transaminitis/History of cirrhosis -Likely secondary to the above -Hepatitis panel unremarkable -RUQ Abdominal US: Fatty infiltration of the liver, trace free fluid adjacent to the liver -Repeat CMP in one week  Hypertension -Continue amlodipine, metoprolol  History of bipolar/schizophrenia -Continue Abilify  Procedures  RUQ Abdominal US  Consults  Psychiatry   Discharge Exam: Filed Vitals:   04/13/15 0432  BP: 106/66  Pulse: 64  Temp: 97.9 F (36.6 C)  Resp: 16   Exam  General: Well developed, well nourished, NAD  HEENT: NCATmucous membranes moist.   Cardiovascular: S1 S2 auscultated, RRR, no murmurs  Respiratory: Clear to auscultation  Abdomen: Soft, nontender, nondistended, + bowel sounds  Extremities: warm dry without cyanosis clubbing or edema  Neuro: AAOx3, nonfocal  Psych: Appropriate mood and affect  Discharge Instructions     Medication List    ASK your doctor about these medications        amLODipine 5 MG tablet  Commonly known as:  NORVASC  Take 1 tablet (5 mg total) by mouth daily.     ARIPiprazole 5 MG tablet  Commonly known as:  ABILIFY  Take 5 mg by mouth 2 (two) times daily.     ARIPiprazole 10 MG tablet  Commonly known as:  ABILIFY  Take 1 tablet (10 mg total) by mouth daily.     dicyclomine 10 MG capsule  Commonly known as:  BENTYL  Take 1 capsule (10 mg  total) by mouth 4 (four) times daily -  before meals and at bedtime.     DULoxetine 30 MG capsule  Commonly known as:  CYMBALTA  Take 1 capsule (30 mg total) by mouth daily.     gabapentin 100 MG capsule  Commonly known as:  NEURONTIN  Take 200 mg by mouth 3 (three) times daily.     gabapentin 100 MG capsule  Commonly known as:  NEURONTIN  Take 2 capsules (200 mg total) by mouth 3 (three) times daily.     gabapentin 300 MG capsule  Commonly known as:  NEURONTIN  Take 1 capsule (300 mg total) by mouth at bedtime.     hydrOXYzine 25 MG tablet  Commonly known as:  ATARAX/VISTARIL  Take 1 tablet (25 mg total) by mouth every 6 (six) hours as needed for anxiety, itching, nausea or vomiting.     ibuprofen 800 MG tablet  Commonly known as:  ADVIL,MOTRIN  Take 1 tablet (800 mg total) by mouth every 8 (eight) hours as needed for moderate pain.     metoCLOPramide 10 MG tablet  Commonly known as:  REGLAN  Take 1 tablet (10 mg total) by mouth every 6 (six) hours.     metoprolol succinate 50 MG 24 hr tablet  Commonly known as:  TOPROL-XL  Take 50 mg by mouth daily. Take with or immediately following a meal.     metoprolol tartrate 25 MG tablet  Commonly known as:  LOPRESSOR  Take 1 tablet (25 mg total) by mouth 2 (two) times daily.     mirtazapine 15 MG tablet  Commonly known as:  REMERON  Take 1 tablet (15 mg total) by mouth at bedtime.     multivitamin with minerals Tabs tablet  Take 1 tablet by mouth daily. May purchase over the counter to improve general health.     naproxen 500 MG tablet  Commonly known as:  NAPROSYN  Take 1 tablet (500 mg total) by mouth 2 (two) times daily.     polyethylene glycol packet  Commonly known as:  MIRALAX  Take 17 g by mouth daily.     prochlorperazine 10 MG tablet  Commonly known as:  COMPAZINE  Take 1 tablet (10 mg total) by mouth 2 (two) times daily as needed for nausea or vomiting (Nausea ).     traZODone 100 MG tablet  Commonly known  as:  DESYREL  Take 100-200 mg by mouth at bedtime.     zolpidem 10 MG tablet  Commonly known as:  AMBIEN  Take 1 tablet (10 mg total) by mouth at bedtime.       Allergies  Allergen Reactions  . Codeine Hives, Itching and Swelling  . Depakote [Divalproex Sodium] Other (See Comments)    "Bug out and hallucinate"  . Morphine Itching  . Penicillins Swelling  . Oxycodone Itching and Swelling  . Hydrocodone-Acetaminophen Itching and Rash      The results of significant diagnostics from this hospitalization (including imaging, microbiology, ancillary and laboratory) are listed below for reference.    Significant Diagnostic Studies: US Abdomen Limited Ruq  2015-05-10   CLINICAL DATA:  Elevated LFTs.  EXAM: US ABDOMEN LIMITED - RIGHT UPPER QUADRANT  COMPARISON:  CT 08/29/2014  FINDINGS: Gallbladder:  No gallstones or wall thickening visualized. No sonographic  Murphy sign noted.  Common bile duct:  Diameter: Normal caliber, 2 mm  Liver:  No focal lesion identified. Mildly increased echotexture suggesting fatty infiltration as seen on prior CT.  Incidentally noted is trace free fluid adjacent to the liver.  IMPRESSION: Fatty infiltration of the liver. Trace free fluid adjacent to the liver.   Electronically Signed   By: Rolm Baptise M.D.   On: 04/13/2015 10:33    Microbiology: Recent Results (from the past 240 hour(s))  MRSA PCR Screening     Status: None   Collection Time: 04/11/15 12:36 AM  Result Value Ref Range Status   MRSA by PCR NEGATIVE NEGATIVE Final    Comment:        The GeneXpert MRSA Assay (FDA approved for NASAL specimens only), is one component of a comprehensive MRSA colonization surveillance program. It is not intended to diagnose MRSA infection nor to guide or monitor treatment for MRSA infections.      Labs: Basic Metabolic Panel:  Recent Labs Lab 04/10/15 2023 04/11/15 0314 04/12/15 0243 04/13/15 0457  NA 141 138 137 136  K 3.8 4.1 3.9 3.5  CL 103  105 101 104  CO2 '25 22 29 25  ' GLUCOSE 99 90 73 142*  BUN 5* '6 10 8  ' CREATININE 0.81 0.82 0.92 0.95  CALCIUM 8.9 8.1* 8.7* 8.4*   Liver Function Tests:  Recent Labs Lab 04/10/15 2023 04/11/15 0850 04/12/15 0243 04/13/15 0457  AST 298* 194* 125* 206*  ALT 241* 190* 151* 194*  ALKPHOS 68 52 55 58  BILITOT 0.4 0.5 0.5 0.4  PROT 7.1 5.8* 5.8* 5.6*  ALBUMIN 3.9 3.1* 3.0* 2.9*   No results for input(s): LIPASE, AMYLASE in the last 168 hours. No results for input(s): AMMONIA in the last 168 hours. CBC:  Recent Labs Lab 04/10/15 2023 04/11/15 0314 04/12/15 0243 04/13/15 0457  WBC 3.5* 3.6* 3.1* 3.8*  NEUTROABS 1.3*  --   --   --   HGB 16.4 15.3 15.0 15.0  HCT 46.4 44.1 43.7 42.3  MCV 83.5 85.3 84.0 84.4  PLT 156 132* 123* 118*   Cardiac Enzymes: No results for input(s): CKTOTAL, CKMB, CKMBINDEX, TROPONINI in the last 168 hours. BNP: BNP (last 3 results) No results for input(s): BNP in the last 8760 hours.  ProBNP (last 3 results) No results for input(s): PROBNP in the last 8760 hours.  CBG:  Recent Labs Lab 04/10/15 1956  GLUCAP 103*       Signed:  Cristal Ford  [facility] 04/13/2015, 1:13 PM

## 2015-04-13 NOTE — Progress Notes (Signed)
Provided pt with substance abuse and shelter resource lists.  Bus pass also provided. Pt with no other SW needs/concerns.  MD informed.

## 2015-04-14 ENCOUNTER — Emergency Department (HOSPITAL_COMMUNITY)
Admission: EM | Admit: 2015-04-14 | Discharge: 2015-04-14 | Discharge status: Absent without leave | Payer: No Typology Code available for payment source

## 2015-04-14 ENCOUNTER — Emergency Department (HOSPITAL_COMMUNITY): Payer: No Typology Code available for payment source

## 2015-04-14 ENCOUNTER — Emergency Department (HOSPITAL_COMMUNITY)
Admission: EM | Admit: 2015-04-14 | Discharge: 2015-04-15 | Disposition: A | Discharge status: Other Acute Inpatient Hospital | Payer: No Typology Code available for payment source | Attending: Emergency Medicine | Admitting: Emergency Medicine

## 2015-04-14 ENCOUNTER — Emergency Department (HOSPITAL_COMMUNITY)
Admission: EM | Admit: 2015-04-14 | Discharge: 2015-04-14 | Disposition: A | Discharge status: Home / Self Care | Payer: No Typology Code available for payment source

## 2015-04-14 ENCOUNTER — Encounter (HOSPITAL_COMMUNITY): Payer: Self-pay | Admitting: Emergency Medicine

## 2015-04-14 DIAGNOSIS — F1410 Cocaine abuse, uncomplicated: Secondary | ICD-10-CM | POA: Insufficient documentation

## 2015-04-14 DIAGNOSIS — Z8639 Personal history of other endocrine, nutritional and metabolic disease: Secondary | ICD-10-CM | POA: Insufficient documentation

## 2015-04-14 DIAGNOSIS — F4310 Post-traumatic stress disorder, unspecified: Secondary | ICD-10-CM | POA: Diagnosis present

## 2015-04-14 DIAGNOSIS — Z853 Personal history of malignant neoplasm of breast: Secondary | ICD-10-CM | POA: Insufficient documentation

## 2015-04-14 DIAGNOSIS — G40909 Epilepsy, unspecified, not intractable, without status epilepticus: Secondary | ICD-10-CM | POA: Insufficient documentation

## 2015-04-14 DIAGNOSIS — Z79899 Other long term (current) drug therapy: Secondary | ICD-10-CM | POA: Insufficient documentation

## 2015-04-14 DIAGNOSIS — I25119 Atherosclerotic heart disease of native coronary artery with unspecified angina pectoris: Secondary | ICD-10-CM | POA: Insufficient documentation

## 2015-04-14 DIAGNOSIS — Z915 Personal history of self-harm: Secondary | ICD-10-CM | POA: Insufficient documentation

## 2015-04-14 DIAGNOSIS — Z87442 Personal history of urinary calculi: Secondary | ICD-10-CM | POA: Insufficient documentation

## 2015-04-14 DIAGNOSIS — F319 Bipolar disorder, unspecified: Secondary | ICD-10-CM | POA: Insufficient documentation

## 2015-04-14 DIAGNOSIS — Z8719 Personal history of other diseases of the digestive system: Secondary | ICD-10-CM | POA: Insufficient documentation

## 2015-04-14 DIAGNOSIS — F419 Anxiety disorder, unspecified: Secondary | ICD-10-CM | POA: Insufficient documentation

## 2015-04-14 DIAGNOSIS — F1024 Alcohol dependence with alcohol-induced mood disorder: Secondary | ICD-10-CM | POA: Diagnosis present

## 2015-04-14 DIAGNOSIS — F209 Schizophrenia, unspecified: Secondary | ICD-10-CM | POA: Insufficient documentation

## 2015-04-14 DIAGNOSIS — Z880 Allergy status to penicillin: Secondary | ICD-10-CM | POA: Insufficient documentation

## 2015-04-14 DIAGNOSIS — R4589 Other symptoms and signs involving emotional state: Secondary | ICD-10-CM

## 2015-04-14 DIAGNOSIS — I10 Essential (primary) hypertension: Secondary | ICD-10-CM | POA: Insufficient documentation

## 2015-04-14 DIAGNOSIS — R4689 Other symptoms and signs involving appearance and behavior: Secondary | ICD-10-CM

## 2015-04-14 DIAGNOSIS — F333 Major depressive disorder, recurrent, severe with psychotic symptoms: Secondary | ICD-10-CM | POA: Diagnosis present

## 2015-04-14 LAB — CBC
HCT: 45.7 % (ref 39.0–52.0)
Hemoglobin: 16.4 g/dL (ref 13.0–17.0)
MCH: 30.1 pg (ref 26.0–34.0)
MCHC: 35.9 g/dL (ref 30.0–36.0)
MCV: 83.9 fL (ref 78.0–100.0)
PLATELETS: 122 10*3/uL — AB (ref 150–400)
RBC: 5.45 MIL/uL (ref 4.22–5.81)
RDW: 15 % (ref 11.5–15.5)
WBC: 5.2 10*3/uL (ref 4.0–10.5)

## 2015-04-14 LAB — COMPREHENSIVE METABOLIC PANEL
ALK PHOS: 68 U/L (ref 38–126)
ALT: 394 U/L — ABNORMAL HIGH (ref 17–63)
ANION GAP: 12 (ref 5–15)
AST: 350 U/L — ABNORMAL HIGH (ref 15–41)
Albumin: 4.1 g/dL (ref 3.5–5.0)
BILIRUBIN TOTAL: 0.6 mg/dL (ref 0.3–1.2)
BUN: 8 mg/dL (ref 6–20)
CALCIUM: 9.1 mg/dL (ref 8.9–10.3)
CO2: 21 mmol/L — AB (ref 22–32)
Chloride: 109 mmol/L (ref 101–111)
Creatinine, Ser: 0.8 mg/dL (ref 0.61–1.24)
GFR calc non Af Amer: >60 mL/min (ref >60)
Glucose, Bld: 84 mg/dL (ref 65–99)
POTASSIUM: 4.7 mmol/L (ref 3.5–5.1)
SODIUM: 142 mmol/L (ref 135–145)
TOTAL PROTEIN: 7.7 g/dL (ref 6.5–8.1)

## 2015-04-14 LAB — RAPID URINE DRUG SCREEN, HOSP PERFORMED
Amphetamines: NOT DETECTED
Barbiturates: NOT DETECTED
Benzodiazepines: NOT DETECTED
Cocaine: POSITIVE — AB
OPIATES: NOT DETECTED
Tetrahydrocannabinol: NOT DETECTED

## 2015-04-14 LAB — ACETAMINOPHEN LEVEL

## 2015-04-14 LAB — SALICYLATE LEVEL

## 2015-04-14 LAB — ETHANOL: Alcohol, Ethyl (B): 312 mg/dL (ref <5)

## 2015-04-14 MED ORDER — THIAMINE HCL 100 MG/ML IJ SOLN: 100.0000 mg | Freq: Every day | INTRAMUSCULAR | Status: DC

## 2015-04-14 MED ORDER — LORAZEPAM 2 MG/ML IJ SOLN: 0.0000 mg | Freq: Four times a day (QID) | INTRAMUSCULAR | Status: DC

## 2015-04-14 MED ORDER — ONDANSETRON HCL 4 MG PO TABS: 4.0000 mg | ORAL_TABLET | Freq: Three times a day (TID) | ORAL | Status: DC | PRN

## 2015-04-14 MED ORDER — METOPROLOL TARTRATE 25 MG PO TABS
25.0000 mg | ORAL_TABLET | Freq: Two times a day (BID) | ORAL | Status: DC
  Filled 2015-04-14: qty 1

## 2015-04-14 MED ORDER — GABAPENTIN 100 MG PO CAPS
200.0000 mg | ORAL_CAPSULE | Freq: Three times a day (TID) | ORAL | Status: DC
  Administered 2015-04-15: 200 mg via ORAL
  Filled 2015-04-14 (×4): qty 2

## 2015-04-14 MED ORDER — LORAZEPAM 2 MG/ML IJ SOLN
0.0000 mg | Freq: Two times a day (BID) | INTRAMUSCULAR | Status: DC
Start: 2015-04-14 — End: 2015-04-15

## 2015-04-14 MED ORDER — ALUM & MAG HYDROXIDE-SIMETH 200-200-20 MG/5ML PO SUSP: 30.0000 mL | ORAL | Status: DC | PRN

## 2015-04-14 MED ORDER — VITAMIN B-1 100 MG PO TABS
100.0000 mg | ORAL_TABLET | Freq: Every day | ORAL | Status: DC
  Administered 2015-04-14 – 2015-04-15 (×2): 100 mg via ORAL
  Filled 2015-04-14 (×2): qty 1

## 2015-04-14 MED ORDER — LORAZEPAM 1 MG PO TABS
0.0000 mg | ORAL_TABLET | Freq: Two times a day (BID) | ORAL | Status: DC
  Administered 2015-04-15: 1 mg via ORAL
  Filled 2015-04-14 (×2): qty 1

## 2015-04-14 MED ORDER — IBUPROFEN 200 MG PO TABS: 600.0000 mg | ORAL_TABLET | Freq: Three times a day (TID) | ORAL | Status: DC | PRN

## 2015-04-14 MED ORDER — LORAZEPAM 1 MG PO TABS
0.0000 mg | ORAL_TABLET | Freq: Four times a day (QID) | ORAL | Status: DC
  Administered 2015-04-14 – 2015-04-15 (×3): 1 mg via ORAL
  Filled 2015-04-14 (×2): qty 1

## 2015-04-14 MED ORDER — AMLODIPINE BESYLATE 5 MG PO TABS
5.0000 mg | ORAL_TABLET | Freq: Every day | ORAL | Status: DC
  Filled 2015-04-14: qty 1

## 2015-04-14 MED ORDER — GABAPENTIN 300 MG PO CAPS
300.0000 mg | ORAL_CAPSULE | Freq: Every day | ORAL | Status: DC
  Administered 2015-04-14: 300 mg via ORAL
  Filled 2015-04-14: qty 1

## 2015-04-14 NOTE — ED Notes (Signed)
Bed: [facility] Expected date:  Expected time:  Means of arrival:  Comments: EMS- 34yo M, ETOH

## 2015-04-14 NOTE — Progress Notes (Signed)
CSW was consulted by TTS to speak with patient about steps for Medicaid.  CSW met with patient at bedside. Patient states that he has applied for medicaid in the past, but was denied. CSW offered patient encouragement. CSW informed patient that a lot people are denied when they first apply for medicaid. CSW asked patient would he be interested in CSS contact information. Patient states that he would like the information.  CSW will provide patient with DSS contact information.  Willette Brace [phone] ED CSW 04/14/2015 6:32 PM

## 2015-04-14 NOTE — BH Assessment (Signed)
Assessment Note   [name] is an 34 y.o. male who was brought to the emergency department by ambulance after having a seizure in his home. He states that he has a history of seizures and they are usually alcohol related. Pt states that he had "a large bottle of wine at 2:00p today" and has been drinking this amount daily. He is currently unemployed and uninsured. He states that he has been to [facility] in the past for substance abuse treatment and has also been inpatient for psychiatric issues. He states that he has a history of Bipolar Disorder and goes to [facility] for medication management. He states that he was suicidal last week and attempted to overdose on medications and was treated at [facility] [address]. He states that he does not feel suicidal today and denies HI and A/V as well. He states that he wants help with his substance abuse issues and would like longer term treatment than 30 days. He states that he also uses crack/cocaine on occasion, his last use being 4 days ago. He states that he is currently not sleeping well and only gets about 4-5 hours of sleep a night. He currently lives with friends but states that there has been a lot of conflict at the house lately.   Disposition: Per Waylan Boga, NP admission to the observation unit is recommended for overnight monitoring and case management.   Diagnosis: 296.7 Bipolar 1 Disorder, Unspecified, 303.90 Alcohol use disorder, severe  Past Medical History:  Past Medical History  Diagnosis Date  . Seizures   . Hypertension   . Depression   . Pancreatitis   . Liver cirrhosis   . Coronary artery disease   . Angina   . Shortness of breath   . Headache(784.0)   . Peripheral vascular disease April 2011    Left Pop  . Hypercholesteremia   . Schizophrenia   . Bipolar 1 disorder   . Cancer of kidney dx'd 04/2013    lt nephrectomy  . Breast CA dx'd 2009    bil w/ bil masectomy and oral meds  . H/O suicide attempt 2015    overdose     Past Surgical History  Procedure Laterality Date  . Chest surgery    . Left leg surgery    . Mastectomy    . Left kidney removal    . Breast surgery      Family History:  Family History  Problem Relation Age of Onset  . Stroke Other   . Cancer Other   . Hyperlipidemia Mother   . Hypertension Mother     Social History:  reports that he has been smoking Cigarettes.  He has a 2.5 pack-year smoking history. He has never used smokeless tobacco. He reports that he does not drink alcohol or use illicit drugs.  Additional Social History:  Alcohol / Drug Use History of alcohol / drug use?: Yes Substance #1 Name of Substance 1: Alcohol  1 - Age of First Use: 16 1 - Amount (size/oz): 1 large bottle of wine 1 - Frequency: Daily  1 - Duration: on and off since he was 16 1 - Last Use / Amount: 2:30p 04/14/15, 1 large bottle of wine Substance #2 Name of Substance 2: Crack cocaine 2 - Age of First Use: Unspecified 2 - Amount (size/oz): unspecified 2 - Frequency: once of twice a month 2 - Duration: unspecified  2 - Last Use / Amount: 4 days ago  CIWA: CIWA-Ar BP: 113/73 mmHg  Pulse Rate: 81 Nausea and Vomiting: 2 Tactile Disturbances: very mild itching, pins and needles, burning or numbness Tremor: no tremor Auditory Disturbances: not present Paroxysmal Sweats: no sweat visible Visual Disturbances: very mild sensitivity Anxiety: no anxiety, at ease Headache, Fullness in Head: very mild Agitation: normal activity Orientation and Clouding of Sensorium: oriented and can do serial additions CIWA-Ar Total: 5 COWS:    PATIENT STRENGTHS: (choose at least two) Average or above average intelligence General fund of knowledge Supportive family/friends  Allergies:  Allergies  Allergen Reactions  . Codeine Hives, Itching and Swelling  . Depakote [Divalproex Sodium] Hives and Other (See Comments)    "Bug out and hallucinate"  . Morphine Itching  . Penicillins Swelling    Has  patient had a PCN reaction causing immediate rash, facial/tongue/throat swelling, SOB or lightheadedness with hypotension: Yes Has patient had a PCN reaction causing severe rash involving mucus membranes or skin necrosis: Yes Has patient had a PCN reaction that required hospitalization Yes-ed visit Has patient had a PCN reaction occurring within the last 10 years: Yes If all of the above answers are "NO", then may proceed with Cephalosporin use.   Marland Kitchen Oxycodone Itching and Swelling  . Hydrocodone-Acetaminophen Itching and Rash    Home Medications:  (Not in a hospital admission)  OB/GYN Status:  No LMP for male patient.  General Assessment Data Location of Assessment: [facility] ED TTS Assessment: In system Is this a Tele or Face-to-Face Assessment?: Face-to-Face Is this an Initial Assessment or a Re-assessment for this encounter?: Initial Assessment Marital status: Single Living Arrangements: Non-relatives/Friends Can pt return to current living arrangement?: Yes Admission Status: Voluntary Is patient capable of signing voluntary admission?: Yes Referral Source: Self/Family/Friend Insurance type: Self Pay     Crisis Care Plan Living Arrangements: Non-relatives/Friends Name of Psychiatrist: None Name of Therapist: None  Education Status Is patient currently in school?: No Highest grade of school patient has completed: GED  Risk to self with the past 6 months Suicidal Ideation: No Has patient been a risk to self within the past 6 months prior to admission? : Yes Suicidal Intent: No Has patient had any suicidal intent within the past 6 months prior to admission? : Yes Is patient at risk for suicide?: Yes Suicidal Plan?: No Has patient had any suicidal plan within the past 6 months prior to admission? : Yes Access to Means: Yes Specify Access to Suicidal Means: access to medication What has been your use of drugs/alcohol within the last 12 months?: uses alcohol daily  Previous  Attempts/Gestures: Yes How many times?: 10 Triggers for Past Attempts: Unpredictable Intentional Self Injurious Behavior: None Family Suicide History: No Recent stressful life event(s): Conflict (Comment) Persecutory voices/beliefs?: No Depression: Yes Depression Symptoms: Tearfulness, Loss of interest in usual pleasures, Feeling worthless/self pity Substance abuse history and/or treatment for substance abuse?: Yes Suicide prevention information given to non-admitted patients: Not applicable  Risk to Others within the past 6 months Homicidal Ideation: No Does patient have any lifetime risk of violence toward others beyond the six months prior to admission? : No Thoughts of Harm to Others: No Current Homicidal Intent: No Current Homicidal Plan: No Access to Homicidal Means: No Identified Victim: none History of harm to others?: No Assessment of Violence: None Noted Violent Behavior Description: none Does patient have access to weapons?: No Criminal Charges Pending?: No Does patient have a court date: No Is patient on probation?: No  Psychosis Hallucinations: None noted Delusions: None noted  Mental Status Report  Appearance/Hygiene: In scrubs Eye Contact: Good Motor Activity: Freedom of movement Speech: Logical/coherent Level of Consciousness: Alert Mood: Depressed Affect: Depressed Anxiety Level: Moderate Thought Processes: Coherent Judgement: Impaired Orientation: Person, Place, Time, Situation Obsessive Compulsive Thoughts/Behaviors: Unable to Assess  Cognitive Functioning Concentration: Good Memory: Remote Intact, Recent Impaired IQ: Average Insight: Poor Impulse Control: Poor Appetite: Poor Weight Loss: 0 Weight Gain: 0 Sleep: Decreased Total Hours of Sleep: 4 Vegetative Symptoms: None  ADLScreening [facility] Assessment Services) [name] cognitive ability adequate to safely complete daily activities?: Yes Patient able to express need for assistance with  ADLs?: Yes Independently performs ADLs?: Yes (appropriate for developmental age)  Prior Inpatient Therapy Prior Inpatient Therapy: Yes Reason for Treatment: Substance abuse/bipolar disorder  Prior Outpatient Therapy Prior Outpatient Therapy: Yes Prior Therapy Dates: ongoing Prior Therapy Facilty/Provider(s): [facility] Reason for Treatment: substance abuse/medication management Does patient have an ACCT team?: No Does patient have Intensive In-House Services?  : No Does patient have [facility] services? : Yes Does patient have P4CC services?: No  ADL Screening (condition at time of admission) [name] cognitive ability adequate to safely complete daily activities?: Yes Is the patient deaf or have difficulty hearing?: No Does the patient have difficulty seeing, even when wearing glasses/contacts?: No Does the patient have difficulty concentrating, remembering, or making decisions?: No Patient able to express need for assistance with ADLs?: Yes Does the patient have difficulty dressing or bathing?: No Independently performs ADLs?: Yes (appropriate for developmental age) Does the patient have difficulty walking or climbing stairs?: No Weakness of Legs: None Weakness of Arms/Hands: None  Home Assistive Devices/Equipment Home Assistive Devices/Equipment: None  Therapy Consults (therapy consults require a physician order) PT Evaluation Needed: No OT Evalulation Needed: No SLP Evaluation Needed: No Abuse/Neglect Assessment (Assessment to be complete while patient is alone) Physical Abuse: Denies Verbal Abuse: Denies Sexual Abuse: Denies Exploitation of patient/patient's resources: Denies Self-Neglect: Denies Values / Beliefs Cultural Requests During Hospitalization: None Spiritual Requests During Hospitalization: None Consults Spiritual Care Consult Needed: No Social Work Consult Needed: Yes (Comment) (Needs help getting into substance abuse treatment) Advance Directives (For  Healthcare) Does patient have an advance directive?: No Would patient like information on creating an advanced directive?: No - patient declined information    Additional Information 1:1 In Past 12 Months?: No CIRT Risk: No Elopement Risk: No Does patient have medical clearance?: Yes     Disposition:  Disposition Initial Assessment Completed for this Encounter: Yes Disposition of Patient: Other dispositions Other disposition(s):  (Observation unit)  Cheshire,Kristin 04/14/2015 6:01 PM

## 2015-04-14 NOTE — ED Notes (Signed)
Pt with EMS. Pt with ETOH; wine specifically. Pt found laying in the grass face up with no trauma associated. Pt was awake when EMS arrived. Pt states he had a seizure but onlookers denied any seizure like activity. Pt told EMS en route that he wanted to hurt himself. Pt has pulled out 2 IV accesses. Pt now denies to staff that he is wanting to hurt himself.

## 2015-04-14 NOTE — BHH Counselor (Signed)
Pt. Accepted to [facility] observation unit bed 7 per Debarah Crape, [facility]. Can be transported with BAL is under 200. BAL will need to be rechecked in a couple of hours.   Bedelia Person, M.S., LPCA, [place], [facility] Licensed Professional Counselor Associate  Triage Specialist  [facility]  Therapeutic Triage Services Phone: [phone] Fax: [phone]

## 2015-04-14 NOTE — ED Provider Notes (Signed)
CSN: [id number]     Arrival date & time 04/14/15  1520 History   First MD Initiated Contact with Patient 04/14/15 1532     Chief Complaint  Patient presents with  . Alcohol Intoxication  . Suicidal     (Consider location/radiation/quality/duration/timing/severity/associated sxs/prior Treatment) Patient is a 34 y.o. male presenting with mental health disorder.  Mental Health Problem Presenting symptoms: agitation, suicidal thoughts and suicidal threats   Presenting symptoms: no aggressive behavior, no hallucinations and no suicide attempt   Patient accompanied by:  Law enforcement Degree of incapacity (severity):  Mild Onset quality:  Gradual Duration:  1 day Timing:  Constant Progression:  Worsening Chronicity:  Recurrent Context: alcohol use and noncompliance   Context: not drug abuse and not medication   Relieved by:  None tried Associated symptoms: anxiety   Associated symptoms: no chest pain     Past Medical History  Diagnosis Date  . Seizures   . Hypertension   . Depression   . Pancreatitis   . Liver cirrhosis   . Coronary artery disease   . Angina   . Shortness of breath   . Headache(784.0)   . Peripheral vascular disease April 2011    Left Pop  . Hypercholesteremia   . Schizophrenia   . Bipolar 1 disorder   . Cancer of kidney dx'd 04/2013    lt nephrectomy  . Breast CA dx'd 2009    bil w/ bil masectomy and oral meds  . H/O suicide attempt 2015    overdose   Past Surgical History  Procedure Laterality Date  . Chest surgery    . Left leg surgery    . Mastectomy    . Left kidney removal    . Breast surgery     Family History  Problem Relation Age of Onset  . Stroke Other   . Cancer Other   . Hyperlipidemia Mother   . Hypertension Mother    Social History  Substance Use Topics  . Smoking status: Current Every Day Smoker -- 0.25 packs/day for 10 years    Types: Cigarettes  . Smokeless tobacco: Never Used  . Alcohol Use: No     Comment: former     Review of Systems  Constitutional: Negative for fever and chills.  Eyes: Negative for pain.  Respiratory: Positive for cough.   Cardiovascular: Negative for chest pain.  Gastrointestinal: Negative for nausea and diarrhea.  Genitourinary: Negative for flank pain and penile pain.  Psychiatric/Behavioral: Positive for suicidal ideas and agitation. Negative for hallucinations. The patient is nervous/anxious.   All other systems reviewed and are negative.     Allergies  Codeine; Depakote; Morphine; Penicillins; Oxycodone; and Hydrocodone-acetaminophen  Home Medications   Prior to Admission medications   Medication Sig Start Date End Date Taking? Authorizing Provider  amLODipine (NORVASC) 5 MG tablet Take 1 tablet (5 mg total) by mouth daily. Patient not taking: Reported on 12/25/2014 09/13/14   [name], NP  ARIPiprazole (ABILIFY) 10 MG tablet Take 1 tablet (10 mg total) by mouth daily. Patient not taking: Reported on 04/10/2015 09/13/14   [name], NP  dicyclomine (BENTYL) 10 MG capsule Take 1 capsule (10 mg total) by mouth 4 (four) times daily -  before meals and at bedtime. Patient not taking: Reported on 12/25/2014 11/19/14   Jeannett Senior, PA-C  DULoxetine (CYMBALTA) 30 MG capsule Take 1 capsule (30 mg total) by mouth daily. 09/13/14   [name], NP  gabapentin (NEURONTIN) 100 MG  capsule Take 2 capsules (200 mg total) by mouth 3 (three) times daily. Patient not taking: Reported on 01/17/2015 09/13/14   [name], NP  gabapentin (NEURONTIN) 300 MG capsule Take 1 capsule (300 mg total) by mouth at bedtime. Patient not taking: Reported on 04/10/2015 09/13/14   [name], NP  hydrOXYzine (ATARAX/VISTARIL) 25 MG tablet Take 1 tablet (25 mg total) by mouth every 6 (six) hours as needed for anxiety, itching, nausea or vomiting. 09/13/14   [name], NP  metoCLOPramide (REGLAN) 10 MG tablet Take 1 tablet (10 mg total) by mouth every 6 (six) hours. Patient not taking:  Reported on 04/10/2015 01/17/15   Charlann Lange, PA-C  metoprolol tartrate (LOPRESSOR) 25 MG tablet Take 1 tablet (25 mg total) by mouth 2 (two) times daily. Patient not taking: Reported on 12/25/2014 09/13/14   [name], NP  Multiple Vitamin (MULTIVITAMIN WITH MINERALS) TABS tablet Take 1 tablet by mouth daily. May purchase over the counter to improve general health. Patient not taking: Reported on 12/25/2014 09/13/14   [name], NP  polyethylene glycol [facility]) packet Take 17 g by mouth daily. Patient not taking: Reported on 04/10/2015 01/17/15   Charlann Lange, PA-C  thiamine 100 MG tablet Take 1 tablet (100 mg total) by mouth daily. 04/13/15   Maryann Mikhail, DO   BP 114/78 mmHg  Pulse 91  Temp(Src) 98.5 F (36.9 C) (Oral)  Resp 20  Ht 5\' 6"  (1.676 m)  Wt 160 lb (72.576 kg)  BMI 25.84 kg/m2  SpO2 97% Physical Exam  Constitutional: He is oriented to person, place, and time. He appears well-developed and well-nourished.  HENT:  Head: Normocephalic and atraumatic.  Eyes: Conjunctivae and EOM are normal.  Neck: Normal range of motion. Neck supple.  Cardiovascular: Normal rate and regular rhythm.   Pulmonary/Chest: Effort normal. No respiratory distress.  Abdominal: Soft. There is no tenderness.  Musculoskeletal: Normal range of motion. He exhibits no edema or tenderness.  Neurological: He is alert and oriented to person, place, and time.  Skin: Skin is warm and dry.  Nursing note and vitals reviewed.   ED Course  Procedures (including critical care time) Labs Review Labs Reviewed  COMPREHENSIVE METABOLIC PANEL  ETHANOL  SALICYLATE LEVEL  ACETAMINOPHEN LEVEL  CBC  URINE RAPID DRUG SCREEN, HOSP PERFORMED    Imaging Review US Abdomen Limited Ruq  04/13/2015   CLINICAL DATA:  Elevated LFTs.  EXAM: US ABDOMEN LIMITED - RIGHT UPPER QUADRANT  COMPARISON:  CT 08/29/2014  FINDINGS: Gallbladder:  No gallstones or wall thickening visualized. No sonographic Murphy sign noted.  Common  bile duct:  Diameter: Normal caliber, 2 mm  Liver:  No focal lesion identified. Mildly increased echotexture suggesting fatty infiltration as seen on prior CT.  Incidentally noted is trace free fluid adjacent to the liver.  IMPRESSION: Fatty infiltration of the liver. Trace free fluid adjacent to the liver.   Electronically Signed   By: Rolm Baptise M.D.   On: 04/13/2015 10:33   I have personally reviewed and evaluated these images and lab results as part of my medical decision-making.   EKG Interpretation None      MDM   Final diagnoses:  Suicidal behavior   34 year old male brought in by EMS secondary to call intoxication and verbalized suicidal threats. Initially patient was denying suicidality here however the patient states that if he were to leave he would kill himself. Does not have a plan this time. However he is  intoxicated so keeping for likely reevaluation in the morning. He has rising liver enzymes however he had an ultrasound done yesterday which was normal.      Merrily Pew, MD 04/14/15 2326

## 2015-04-14 NOTE — ED Notes (Addendum)
Pt schedule to be transported to [facility] observation unit. Pt Ivc'd. Per EPD MD. [place] advised pt to be reevaluated by Psy in the AM. [name] made aware. Safety monitored and maintained. Tlewis RN

## 2015-04-15 ENCOUNTER — Observation Stay (HOSPITAL_COMMUNITY)
Admission: AD | Admit: 2015-04-15 | Discharge: 2015-04-16 | Disposition: A | Discharge status: Home / Self Care | Payer: No Typology Code available for payment source | Source: Intra-hospital | Attending: Psychiatry | Admitting: Psychiatry

## 2015-04-15 DIAGNOSIS — F339 Major depressive disorder, recurrent, unspecified: Principal | ICD-10-CM | POA: Diagnosis present

## 2015-04-15 DIAGNOSIS — E780 Pure hypercholesterolemia: Secondary | ICD-10-CM | POA: Insufficient documentation

## 2015-04-15 DIAGNOSIS — K7460 Unspecified cirrhosis of liver: Secondary | ICD-10-CM | POA: Insufficient documentation

## 2015-04-15 DIAGNOSIS — Z85528 Personal history of other malignant neoplasm of kidney: Secondary | ICD-10-CM | POA: Insufficient documentation

## 2015-04-15 DIAGNOSIS — Z9013 Acquired absence of bilateral breasts and nipples: Secondary | ICD-10-CM | POA: Insufficient documentation

## 2015-04-15 DIAGNOSIS — F4310 Post-traumatic stress disorder, unspecified: Secondary | ICD-10-CM | POA: Insufficient documentation

## 2015-04-15 DIAGNOSIS — F330 Major depressive disorder, recurrent, mild: Secondary | ICD-10-CM

## 2015-04-15 DIAGNOSIS — F1020 Alcohol dependence, uncomplicated: Secondary | ICD-10-CM | POA: Insufficient documentation

## 2015-04-15 DIAGNOSIS — I739 Peripheral vascular disease, unspecified: Secondary | ICD-10-CM | POA: Insufficient documentation

## 2015-04-15 DIAGNOSIS — Z905 Acquired absence of kidney: Secondary | ICD-10-CM | POA: Insufficient documentation

## 2015-04-15 DIAGNOSIS — F17210 Nicotine dependence, cigarettes, uncomplicated: Secondary | ICD-10-CM | POA: Insufficient documentation

## 2015-04-15 DIAGNOSIS — R45851 Suicidal ideations: Secondary | ICD-10-CM

## 2015-04-15 DIAGNOSIS — I10 Essential (primary) hypertension: Secondary | ICD-10-CM | POA: Insufficient documentation

## 2015-04-15 DIAGNOSIS — Z590 Homelessness: Secondary | ICD-10-CM | POA: Insufficient documentation

## 2015-04-15 DIAGNOSIS — I2510 Atherosclerotic heart disease of native coronary artery without angina pectoris: Secondary | ICD-10-CM | POA: Insufficient documentation

## 2015-04-15 DIAGNOSIS — F1410 Cocaine abuse, uncomplicated: Secondary | ICD-10-CM | POA: Insufficient documentation

## 2015-04-15 DIAGNOSIS — Z853 Personal history of malignant neoplasm of breast: Secondary | ICD-10-CM | POA: Insufficient documentation

## 2015-04-15 DIAGNOSIS — F1024 Alcohol dependence with alcohol-induced mood disorder: Secondary | ICD-10-CM | POA: Diagnosis present

## 2015-04-15 MED ORDER — LORAZEPAM 2 MG/ML IJ SOLN: 0.0000 mg | Freq: Four times a day (QID) | INTRAMUSCULAR | Status: DC

## 2015-04-15 MED ORDER — GABAPENTIN 300 MG PO CAPS
300.0000 mg | ORAL_CAPSULE | Freq: Three times a day (TID) | ORAL | Status: DC
  Administered 2015-04-15 – 2015-04-16 (×3): 300 mg via ORAL
  Filled 2015-04-15 (×3): qty 1

## 2015-04-15 MED ORDER — LORAZEPAM 1 MG PO TABS
1.0000 mg | ORAL_TABLET | Freq: Three times a day (TID) | ORAL | Status: AC
  Administered 2015-04-15 – 2015-04-16 (×3): 1 mg via ORAL
  Filled 2015-04-15 (×2): qty 1

## 2015-04-15 MED ORDER — LORAZEPAM 1 MG PO TABS: 1.0000 mg | ORAL_TABLET | Freq: Two times a day (BID) | ORAL | Status: DC

## 2015-04-15 MED ORDER — THIAMINE HCL 100 MG/ML IJ SOLN
100.0000 mg | Freq: Once | INTRAMUSCULAR | Status: AC
Start: 2015-04-15 — End: 2015-04-15
  Administered 2015-04-15: 100 mg via INTRAMUSCULAR
  Filled 2015-04-15: qty 2

## 2015-04-15 MED ORDER — AMLODIPINE BESYLATE 5 MG PO TABS
5.0000 mg | ORAL_TABLET | Freq: Every day | ORAL | Status: DC
  Administered 2015-04-16: 5 mg via ORAL
  Filled 2015-04-15: qty 1

## 2015-04-15 MED ORDER — ALUM & MAG HYDROXIDE-SIMETH 200-200-20 MG/5ML PO SUSP: 30.0000 mL | ORAL | Status: DC | PRN

## 2015-04-15 MED ORDER — METOPROLOL TARTRATE 25 MG PO TABS
25.0000 mg | ORAL_TABLET | Freq: Two times a day (BID) | ORAL | Status: DC
  Administered 2015-04-15 – 2015-04-16 (×2): 25 mg via ORAL
  Filled 2015-04-15 (×2): qty 1

## 2015-04-15 MED ORDER — HYDROXYZINE HCL 25 MG PO TABS
25.0000 mg | ORAL_TABLET | Freq: Four times a day (QID) | ORAL | Status: DC | PRN
  Administered 2015-04-15 – 2015-04-16 (×2): 25 mg via ORAL
  Filled 2015-04-15 (×2): qty 1

## 2015-04-15 MED ORDER — MAGNESIUM HYDROXIDE 400 MG/5ML PO SUSP: 30.0000 mL | Freq: Every day | ORAL | Status: DC | PRN

## 2015-04-15 MED ORDER — LORAZEPAM 1 MG PO TABS: 0.0000 mg | ORAL_TABLET | Freq: Four times a day (QID) | ORAL | Status: DC

## 2015-04-15 MED ORDER — VITAMIN B-1 100 MG PO TABS
100.0000 mg | ORAL_TABLET | Freq: Every day | ORAL | Status: DC
  Administered 2015-04-15: 100 mg via ORAL

## 2015-04-15 MED ORDER — LORAZEPAM 1 MG PO TABS: 1.0000 mg | ORAL_TABLET | Freq: Every day | ORAL | Status: DC

## 2015-04-15 MED ORDER — ONDANSETRON 4 MG PO TBDP: 4.0000 mg | ORAL_TABLET | Freq: Four times a day (QID) | ORAL | Status: DC | PRN

## 2015-04-15 MED ORDER — GABAPENTIN 300 MG PO CAPS
300.0000 mg | ORAL_CAPSULE | Freq: Three times a day (TID) | ORAL | Status: DC
  Administered 2015-04-15: 300 mg via ORAL
  Filled 2015-04-15: qty 1

## 2015-04-15 MED ORDER — TRAZODONE HCL 100 MG PO TABS
100.0000 mg | ORAL_TABLET | Freq: Every evening | ORAL | Status: DC | PRN
  Administered 2015-04-15: 100 mg via ORAL
  Filled 2015-04-15: qty 1

## 2015-04-15 MED ORDER — LOPERAMIDE HCL 2 MG PO CAPS
2.0000 mg | ORAL_CAPSULE | ORAL | Status: DC | PRN
  Administered 2015-04-15: 4 mg via ORAL
  Filled 2015-04-15: qty 2

## 2015-04-15 MED ORDER — LORAZEPAM 1 MG PO TABS
1.0000 mg | ORAL_TABLET | Freq: Four times a day (QID) | ORAL | Status: AC
  Administered 2015-04-15 (×2): 1 mg via ORAL
  Filled 2015-04-15 (×2): qty 1

## 2015-04-15 MED ORDER — LORAZEPAM 1 MG PO TABS: 0.0000 mg | ORAL_TABLET | Freq: Two times a day (BID) | ORAL | Status: DC

## 2015-04-15 MED ORDER — TRAZODONE HCL 100 MG PO TABS: 100.0000 mg | ORAL_TABLET | Freq: Every evening | ORAL | Status: DC | PRN

## 2015-04-15 MED ORDER — VITAMIN B-1 100 MG PO TABS
100.0000 mg | ORAL_TABLET | Freq: Every day | ORAL | Status: DC
  Administered 2015-04-16: 100 mg via ORAL
  Filled 2015-04-15: qty 1

## 2015-04-15 MED ORDER — THIAMINE HCL 100 MG/ML IJ SOLN: 100.0000 mg | Freq: Every day | INTRAMUSCULAR | Status: DC

## 2015-04-15 MED ORDER — VITAMIN B-1 100 MG PO TABS: 100.0000 mg | ORAL_TABLET | Freq: Every day | ORAL | Status: DC

## 2015-04-15 MED ORDER — ADULT MULTIVITAMIN W/MINERALS CH
1.0000 | ORAL_TABLET | Freq: Every day | ORAL | Status: DC
  Administered 2015-04-15 – 2015-04-16 (×2): 1 via ORAL
  Filled 2015-04-15 (×2): qty 1

## 2015-04-15 MED ORDER — LORAZEPAM 2 MG/ML IJ SOLN: 0.0000 mg | Freq: Two times a day (BID) | INTRAMUSCULAR | Status: DC
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
## 2015-04-15 NOTE — [facility] Counselor (Addendum)
[facility] Assessment Progress Note  Spoke briefly with pt upon his admission to OBS. His mood and affect were depressed. He clarified that long term substance abuse treatment was "not my main concern". He stated that he was more interested in an outpatient program.   Kenna Gilbert. Lovena Le, [place], [place], [facility] Counselor

## 2015-04-15 NOTE — Progress Notes (Signed)
D: Patient awake on approach.  Patient states he is here because he had a seizure one day ago and he was brought to the hospital.  Patient states he was recently discharged from [facility] s/p suicide attempt drug od.  Patient states he has been off of all of his medications for 2 months.  Patient currently denies SI/HI and denies AVH.  Patient verbally contracts for safety.  Patient states he was recently evicted from his apartment and is unsure where he will go to liive when discharged.  Patient states hopefully his parents will be able to assist him finacially until he can get his own place to live. A: Staff to monitor Q 15 mins for safety.  Encouragement and support offered.  Scheduled medications administered per orders.  Imodium administered prn for diarrhea. R: Patient remains safe on the unit.  Patient taking administered medications.  Patient had relief from imodium for diarrhea.

## 2015-04-15 NOTE — Tx Team (Signed)
Initial Interdisciplinary Treatment Plan   PATIENT STRESSORS: Financial difficulties Occupational concerns   PATIENT STRENGTHS: Motivation for treatment/growth   PROBLEM LIST: Problem List/Patient Goals Date to be addressed Date deferred Reason deferred Estimated date of resolution  Suicidal ideation 04/15/15   DC  depression                                                 DISCHARGE CRITERIA:  Improved stabilization in mood, thinking, and/or behavior Reduction of life-threatening or endangering symptoms to within safe limits  PRELIMINARY DISCHARGE PLAN: Outpatient therapy  PATIENT/FAMIILY INVOLVEMENT: This treatment plan has been presented to and reviewed with the patient, [name], and/or family member,pt..  The patient and family have been given the opportunity to ask questions and make suggestions.  [name] 04/15/2015, 4:16 PM

## 2015-04-15 NOTE — [facility] Assessment (Signed)
[facility] Assessment Progress Note  Per Corena Pilgrim, MD, this pt would benefit from admission to the [facility] Observation Unit at this time.  Letitia Libra, RN, [facility] has assigned pt to Obs 2.  Pt has signed Voluntary Admission and Consent for Treatment, as well as Consent to Release Information, and signed forms have been faxed to [facility].  Pt's nurse, Marcie Bal, has been notified, and agrees to send original paperwork along with pt via B[facility] Pries, and to call report to [phone] or [phone].  Jalene Mullet, [place] Triage Specialist [phone]

## 2015-04-15 NOTE — Progress Notes (Addendum)
All BP meds held this am due to low BP. EDP made aware of elevated HR and will monitor this closely. Pt was given gatroade to drink and still has a CIWA of a 4. He does have visible shakes.pt stated he has been drinking for a lot of years and does want to die. He presently has a Actuary. 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 He remains a 1:1 with a Actuary. 1:20p-phoned report to Clair Gulling in the observation unit. Pt will eat lunch here firs and then will be transported via [facility]. [facility] will arrive within 30 minutes (1:50pm)
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
## 2015-04-15 NOTE — Progress Notes (Signed)
Spoke with EDP- Pts BP is on the low side and HR elevated. Pt stated when home he often does get lightheaded and dizzy after taking these BP medications. For now BP meds on hold till further notice.
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
## 2015-04-16 DIAGNOSIS — F330 Major depressive disorder, recurrent, mild: Secondary | ICD-10-CM

## 2015-04-16 MED ORDER — ARIPIPRAZOLE 10 MG PO TABS: 10.0000 mg | ORAL_TABLET | Freq: Every day | ORAL | Status: DC

## 2015-04-16 MED ORDER — HYDROXYZINE HCL 25 MG PO TABS: 25.0000 mg | ORAL_TABLET | Freq: Three times a day (TID) | ORAL | Status: DC

## 2015-04-16 MED ORDER — METOPROLOL TARTRATE 25 MG PO TABS: 25.0000 mg | ORAL_TABLET | Freq: Two times a day (BID) | ORAL | Status: DC

## 2015-04-16 MED ORDER — TRAZODONE HCL 100 MG PO TABS: 100.0000 mg | ORAL_TABLET | Freq: Every day | ORAL | Status: DC

## 2015-04-16 MED ORDER — AMLODIPINE BESYLATE 5 MG PO TABS: 5.0000 mg | ORAL_TABLET | Freq: Every day | ORAL | Status: DC

## 2015-04-16 MED ORDER — GABAPENTIN 300 MG PO CAPS: 300.0000 mg | ORAL_CAPSULE | Freq: Three times a day (TID) | ORAL | Status: DC

## 2015-04-16 NOTE — BHH Counselor (Signed)
[place] Assessment Progress Note  Per Waylan Boga, DNP, pt will be d/c with referrals to local shelters. Counselor spoke to pt and advised him that any access to local shelters is centralized thru [facility] and pt would have to go to [facility] to be placed in an available shelter. Pt expressed understanding and requested a bus pass.   Pt's affect is pleasant. He is amenable to and ready for d/c.   Kenna Gilbert. Lovena Le, [place], [place], LPCA Counselor

## 2015-04-16 NOTE — Discharge Summary (Signed)
[facility] Discharge Summary    Patient Identification: Hans Rusher MRN:  [id number] Principal Diagnosis: Major depressive disorder, recurrent episode Diagnosis:   Patient Active Problem List   Diagnosis Date Noted  . Alcohol withdrawal [F10.239] 08/02/2013    Priority: High  . Alcohol dependence [F10.20] 03/29/2013    Priority: High  . PTSD (post-traumatic stress disorder) [F43.10] 07/06/2012    Priority: High  . Major depressive disorder, recurrent episode [F33.9] 06/29/2012    Priority: High    Class: Acute  . Alcohol use disorder, severe, dependence [F10.20] 04/15/2015  . Alcohol abuse with intoxication [F10.129] 04/11/2015  . Cocaine abuse with cocaine-induced mood disorder [F14.14] 04/11/2015  . Overdose [T50.901A] 04/10/2015  . 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 Severe major depression with psychotic features [F32.3] 09/11/2014  . 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 Suicidal ideation [R45.851]   . Tylenol overdose [T39.1X4A]   . Polysubstance abuse [F19.10]   . Overdose of acetaminophen [T39.1X4A] 08/03/2014  . Overdose by acetaminophen [T39.1X4A] 08/03/2014  . Cocaine abuse [F14.10] 04/16/2014  . Alcohol dependence with withdrawal [F10.239] 04/15/2014  . Thrombocytopenia [D69.6] 04/15/2014  . Urinary tract infection, site not specified [N39.0] 04/15/2014  . Chest pain, unspecified [R07.9] 04/15/2014  . D-dimer, elevated [R79.1] 04/15/2014  . Alcohol abuse [F10.10] 01/29/2014  . Alcohol abuse with alcohol-induced mood disorder [F10.14] 01/28/2014  . Transaminitis [R74.0] 09/24/2013  . Alcohol intoxication [F10.129] 09/22/2013  . Cocaine dependence [F14.20] 09/20/2013  . 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 Seizure [R56.9] 03/15/2013  . Syncope [R55] 01/02/2013  . Leukocytopenia, unspecified [D72.819] 01/02/2013  . Left kidney mass [N28.89] 12/24/2012  . Peripheral vascular disease [I73.9] 01/14/2012  .  SEIZURE DISORDER [R56.9] 10/03/2008  . LIVER FUNCTION TESTS, ABNORMAL [R94.5] 12/29/2007  . HYPERCHOLESTEROLEMIA [E78.0] 03/21/2007  . Essential hypertension [I10] 03/21/2007    Total Time spent with patient: 30 minutes  Subjective:   [name] is a 34 y.o. male patient denies suicidal/homicidal ideations, hallucinations, and withdrawal symptoms.  Philo has some depression due to losing his job recently and being homeless.  Patient does not want to go to rehab or substance abuse programs since he reports going to a total of five in the past.  Objective:  Patient resting quietly, no signs of withdrawal symptoms.  Engages easily in conversation, good eye contact.  HPI:  34 yo male who presented to [facility] after drinking alcohol daily and having withdrawal symptoms.  The Ativan alcohol detox protocol was initiated and he stabilized.  Individual therapy and substance abuse counseling provided.  Resources provided for homeless shelters and outpatient services.  Past Psychiatric History:  Depression, alcohol dependence  Risk to Self:  NOne Risk to Others:  None Prior Inpatient Therapy:  Multiple times Prior Outpatient Therapy:  [facility]  Past Medical History:  Past Medical History  Diagnosis Date  . Seizures   . Hypertension   . Depression   . Pancreatitis   . Liver cirrhosis   . Coronary artery disease   . Angina   . Shortness of breath   . Headache(784.0)   . Peripheral vascular disease April 2011    Left Pop  . Hypercholesteremia   . Schizophrenia   . Bipolar 1 disorder   . Cancer of kidney dx'd 04/2013    lt nephrectomy  . Breast CA dx'd 2009    bil w/ bil masectomy and oral meds  . H/O suicide attempt 2015    overdose    Past Surgical  History  Procedure Laterality Date  . Chest surgery    . Left leg surgery    . Mastectomy    . Left kidney removal    . Breast surgery     Family History:  Family History  Problem Relation Age of Onset  . Stroke Other   . Cancer  Other   . Hyperlipidemia Mother   . Hypertension Mother    Family Psychiatric  History: Substance abuse, depression Social History:  History  Alcohol Use No    Comment: former     History  Drug Use No    Comment: former    Social History   Social History  . Marital Status: Single    Spouse Name: N/A  . Number of Children: N/A  . Years of Education: N/A   Social History Main Topics  . Smoking status: Current Every Day Smoker -- 0.25 packs/day for 10 years    Types: Cigarettes  . Smokeless tobacco: Never Used  . Alcohol Use: No     Comment: former  . Drug Use: No     Comment: former  . Sexual Activity: Yes     Comment: anal   Other Topics Concern  . Not on file   Social History Narrative   Additional Social History:                          Allergies:   Allergies  Allergen Reactions  . Codeine Hives, Itching and Swelling  . Penicillins Swelling    Has patient had a PCN reaction causing immediate rash, facial/tongue/throat swelling, SOB or lightheadedness with hypotension: Yes Has patient had a PCN reaction causing severe rash involving mucus membranes or skin necrosis: Yes Has patient had a PCN reaction that required hospitalization Yes-ed visit Has patient had a PCN reaction occurring within the last 10 years: Yes If all of the above answers are "NO", then may proceed with Cephalosporin use.   . Depakote [Divalproex Sodium] Hives and Other (See Comments)    "Bug out and hallucinate"  . Morphine Itching  . Depakote Er [Divalproex Sodium Er] Hives  . Oxycodone Itching and Swelling  . Hydrocodone-Acetaminophen Itching and Rash    Labs:  Results for orders placed or performed during the hospital encounter of 04/14/15 (from the past 48 hour(s))  Comprehensive metabolic panel     Status: Abnormal   Collection Time: 04/14/15  4:32 PM  Result Value Ref Range   Sodium 142 135 - 145 mmol/L   Potassium 4.7 3.5 - 5.1 mmol/L   Chloride 109 101 - 111  mmol/L   CO2 21 (L) 22 - 32 mmol/L   Glucose, Bld 84 65 - 99 mg/dL   BUN 8 6 - 20 mg/dL   Creatinine, Ser 0.80 0.61 - 1.24 mg/dL   Calcium 9.1 8.9 - 10.3 mg/dL   Total Protein 7.7 6.5 - 8.1 g/dL   Albumin 4.1 3.5 - 5.0 g/dL   AST 350 (H) 15 - 41 U/L   ALT 394 (H) 17 - 63 U/L   Alkaline Phosphatase 68 38 - 126 U/L   Total Bilirubin 0.6 0.3 - 1.2 mg/dL   GFR calc non Af Amer >60 >60 mL/min   GFR calc Af Amer >60 >60 mL/min    Comment: (NOTE) The eGFR has been calculated using the CKD EPI equation. This calculation has not been validated in all clinical situations. eGFR's persistently <60 mL/min signify possible Chronic Kidney Disease.  Anion gap 12 5 - 15  Ethanol (ETOH)     Status: Abnormal   Collection Time: 04/14/15  4:46 PM  Result Value Ref Range   Alcohol, Ethyl (B) 312 (HH) <5 mg/dL    Comment:        LOWEST DETECTABLE LIMIT FOR SERUM ALCOHOL IS 5 mg/dL FOR MEDICAL PURPOSES ONLY CRITICAL RESULT CALLED TO, READ BACK BY AND VERIFIED WITH: TEUP,M @ 1724 ON [id number] BY POTEAT,S   Salicylate level     Status: None   Collection Time: 04/14/15  4:46 PM  Result Value Ref Range   Salicylate Lvl <8.3 2.8 - 30.0 mg/dL  Acetaminophen level     Status: Abnormal   Collection Time: 04/14/15  4:46 PM  Result Value Ref Range   Acetaminophen (Tylenol), Serum <10 (L) 10 - 30 ug/mL    Comment:        THERAPEUTIC CONCENTRATIONS VARY SIGNIFICANTLY. A RANGE OF 10-30 ug/mL MAY BE AN EFFECTIVE CONCENTRATION FOR MANY PATIENTS. HOWEVER, SOME ARE BEST TREATED AT CONCENTRATIONS OUTSIDE THIS RANGE. ACETAMINOPHEN CONCENTRATIONS >150 ug/mL AT 4 HOURS AFTER INGESTION AND >50 ug/mL AT 12 HOURS AFTER INGESTION ARE OFTEN ASSOCIATED WITH TOXIC REACTIONS.   Urine rapid drug screen (hosp performed) (Not at [facility])     Status: Abnormal   Collection Time: 04/14/15  5:00 PM  Result Value Ref Range   Opiates NONE DETECTED NONE DETECTED   Cocaine POSITIVE (A) NONE DETECTED   Benzodiazepines NONE  DETECTED NONE DETECTED   Amphetamines NONE DETECTED NONE DETECTED   Tetrahydrocannabinol NONE DETECTED NONE DETECTED   Barbiturates NONE DETECTED NONE DETECTED    Comment:        DRUG SCREEN FOR MEDICAL PURPOSES ONLY.  IF CONFIRMATION IS NEEDED FOR ANY PURPOSE, NOTIFY LAB WITHIN 5 DAYS.        LOWEST DETECTABLE LIMITS FOR URINE DRUG SCREEN Drug Class       Cutoff (ng/mL) Amphetamine      1000 Barbiturate      200 Benzodiazepine   419 Tricyclics       622 Opiates          300 Cocaine          300 THC              50   CBC     Status: Abnormal   Collection Time: 04/14/15  5:21 PM  Result Value Ref Range   WBC 5.2 4.0 - 10.5 K/uL   RBC 5.45 4.22 - 5.81 MIL/uL   Hemoglobin 16.4 13.0 - 17.0 g/dL   HCT 45.7 39.0 - 52.0 %   MCV 83.9 78.0 - 100.0 fL   MCH 30.1 26.0 - 34.0 pg   MCHC 35.9 30.0 - 36.0 g/dL   RDW 15.0 11.5 - 15.5 %   Platelets 122 (L) 150 - 400 K/uL    Current Facility-Administered Medications  Medication Dose Route Frequency Provider Last Rate Last Dose  . alum & mag hydroxide-simeth (MAALOX/MYLANTA) 200-200-20 MG/5ML suspension 30 mL  30 mL Oral Q4H PRN Delfin Gant, NP      . amLODipine (NORVASC) tablet 5 mg  5 mg Oral Daily Delfin Gant, NP   5 mg at 04/16/15 0825  . gabapentin (NEURONTIN) capsule 300 mg  300 mg Oral TID Delfin Gant, NP   300 mg at 04/16/15 0825  . hydrOXYzine (ATARAX/VISTARIL) tablet 25 mg  25 mg Oral Q6H PRN Hampton Abbot, MD   25 mg at 04/16/15 0651  .  loperamide (IMODIUM) capsule 2-4 mg  2-4 mg Oral PRN Hampton Abbot, MD   4 mg at 04/15/15 1932  . LORazepam (ATIVAN) tablet 1 mg  1 mg Oral TID Hampton Abbot, MD   1 mg at 04/16/15 0824   Followed by  . [START ON 04/17/2015] LORazepam (ATIVAN) tablet 1 mg  1 mg Oral BID Hampton Abbot, MD       Followed by  . [START ON 04/18/2015] LORazepam (ATIVAN) tablet 1 mg  1 mg Oral Daily Hampton Abbot, MD      . magnesium hydroxide (MILK OF MAGNESIA) suspension 30 mL  30 mL Oral Daily  PRN Delfin Gant, NP      . metoprolol tartrate (LOPRESSOR) tablet 25 mg  25 mg Oral BID Delfin Gant, NP   25 mg at 04/16/15 0825  . multivitamin with minerals tablet 1 tablet  1 tablet Oral Daily Hampton Abbot, MD   1 tablet at 04/16/15 0825  . ondansetron (ZOFRAN-ODT) disintegrating tablet 4 mg  4 mg Oral Q6H PRN Hampton Abbot, MD      . thiamine (VITAMIN B-1) tablet 100 mg  100 mg Oral Daily Hampton Abbot, MD   100 mg at 04/16/15 0825  . traZODone (DESYREL) tablet 100 mg  100 mg Oral QHS PRN Delfin Gant, NP   100 mg at 04/15/15 2131    Musculoskeletal: Strength & Muscle Tone: within normal limits Gait & Station: normal Patient leans: N/A  Psychiatric Specialty Exam: Review of Systems  Constitutional: Negative.   HENT: Negative.   Eyes: Negative.   Respiratory: Negative.   Cardiovascular: Negative.   Gastrointestinal: Negative.   Genitourinary: Negative.   Musculoskeletal: Negative.   Skin: Negative.   Neurological: Negative.   Endo/Heme/Allergies: Negative.   Psychiatric/Behavioral: Positive for depression and substance abuse.    Blood pressure 113/68, pulse 97, temperature 98.9 F (37.2 C), temperature source Oral, resp. rate 18, height _0  (1.676 m), weight 68.947 kg (152 lb), SpO2 98 %.Body mass index is 24.55 kg/(m^2).  General Appearance: Casual  Eye Contact::  Good  Speech:  Normal Rate  Volume:  Normal  Mood:  Depressed, mild  Affect:  Congruent  Thought Process:  Coherent  Orientation:  Full (Time, Place, and Person)  Thought Content:  WDL  Suicidal Thoughts:  No  Homicidal Thoughts:  No  Memory:  Immediate;   Good Recent;   Good Remote;   Good  Judgement:  Good  Insight:  Good  Psychomotor Activity:  Normal  Concentration:  Good  Recall:  Good  Fund of Knowledge:Good  Language: Good  Akathisia:  No  Handed:  Right  AIMS (if indicated):     Assets:  Leisure Time Physical Health Resilience  ADL's:  Intact  Cognition: WNL   Sleep:  Number of Hours: 6.5   Treatment Plan Summary: Daily contact with patient to assess and evaluate symptoms and progress in treatment, Medication management and Plan Major depression, recurrent, mild, no psychotic symptoms: -Crisis stabilization -Alcohol detox protocol implemented with Ativan -Individual and substance abuse counseling provided -Resources to outpatient treatment, substance abuse, and homeless shelters provided  Disposition: No evidence of imminent risk to self or others at present.    Waylan Boga, [place] 04/16/2015 9:24 AM

## 2015-04-16 NOTE — Progress Notes (Signed)
Patient ID: [name], male   DOB: 1980-10-27, 34 y.o.   MRN: [id number] NSG D/C Note:Pt denies si/hi at this time. States that he will comply with outpt services and take his meds as prescribed. D/C to lobby with [place].

## 2015-04-16 NOTE — Progress Notes (Signed)
D: Patient resting in bed with eyes closed.  Respirations even and unlabored.  Patient appears to be in no apparent distress. A: Staff to monitor Q 15 mins for safety.   R:Patient remains safe on the unit.  

## 2015-04-16 NOTE — Consult Note (Deleted)
[facility] Discharge Summary    [name] MRN:  [id number] Principal Diagnosis: Major depressive disorder, recurrent episode Diagnosis:   Patient Active Problem List   Diagnosis Date Noted  . Alcohol withdrawal [F10.239] 08/02/2013    Priority: High  . Alcohol dependence [F10.20] 03/29/2013    Priority: High  . PTSD (post-traumatic stress disorder) [F43.10] 07/06/2012    Priority: High  . Major depressive disorder, recurrent episode [F33.9] 06/29/2012    Priority: High    Class: Acute  . Alcohol use disorder, severe, dependence [F10.20] 04/15/2015  . Alcohol abuse with intoxication [F10.129] 04/11/2015  . Cocaine abuse with cocaine-induced mood disorder [F14.14] 04/11/2015  . Overdose [T50.901A] 04/10/2015  . 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 Severe major depression with psychotic features [F32.3] 09/11/2014  . 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 Suicidal ideation [R45.851]   . Tylenol overdose [T39.1X4A]   . Polysubstance abuse [F19.10]   . Overdose of acetaminophen [T39.1X4A] 08/03/2014  . Overdose by acetaminophen [T39.1X4A] 08/03/2014  . Cocaine abuse [F14.10] 04/16/2014  . Alcohol dependence with withdrawal [F10.239] 04/15/2014  . Thrombocytopenia [D69.6] 04/15/2014  . Urinary tract infection, site not specified [N39.0] 04/15/2014  . Chest pain, unspecified [R07.9] 04/15/2014  . D-dimer, elevated [R79.1] 04/15/2014  . Alcohol abuse [F10.10] 01/29/2014  . Alcohol abuse with alcohol-induced mood disorder [F10.14] 01/28/2014  . Transaminitis [R74.0] 09/24/2013  . Alcohol intoxication [F10.129] 09/22/2013  . Cocaine dependence [F14.20] 09/20/2013  . 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 Seizure [R56.9] 03/15/2013  . Syncope [R55] 01/02/2013  . Leukocytopenia, unspecified [D72.819] 01/02/2013  . Left kidney mass [N28.89] 12/24/2012  . Peripheral vascular disease [I73.9] 01/14/2012  .  SEIZURE DISORDER [R56.9] 10/03/2008  . LIVER FUNCTION TESTS, ABNORMAL [R94.5] 12/29/2007  . HYPERCHOLESTEROLEMIA [E78.0] 03/21/2007  . Essential hypertension [I10] 03/21/2007    Total Time spent with patient: 30 minutes  Subjective:   [name] is a 34 y.o. male patient denies suicidal/homicidal ideations, hallucinations, and withdrawal symptoms.  Philo has some depression due to losing his job recently and being homeless.  Patient does not want to go to rehab or substance abuse programs since he reports going to a total of five in the past.  Objective:  Patient resting quietly, no signs of withdrawal symptoms.  Engages easily in conversation, good eye contact.  HPI:  34 yo male who presented to [facility] after drinking alcohol daily and having withdrawal symptoms.  The Ativan alcohol detox protocol was initiated and he stabilized.  Individual therapy and substance abuse counseling provided.  Resources provided for homeless shelters and outpatient services.  Past Psychiatric History:  Depression, alcohol dependence  Risk to Self:  NOne Risk to Others:  None Prior Inpatient Therapy:  Multiple times Prior Outpatient Therapy:  [facility]  Past Medical History:  Past Medical History  Diagnosis Date  . Seizures   . Hypertension   . Depression   . Pancreatitis   . Liver cirrhosis   . Coronary artery disease   . Angina   . Shortness of breath   . Headache(784.0)   . Peripheral vascular disease April 2011    Left Pop  . Hypercholesteremia   . Schizophrenia   . Bipolar 1 disorder   . Cancer of kidney dx'd 04/2013    lt nephrectomy  . Breast CA dx'd 2009    bil w/ bil masectomy and oral meds  . H/O suicide attempt 2015    overdose    Past Surgical  History  Procedure Laterality Date  . Chest surgery    . Left leg surgery    . Mastectomy    . Left kidney removal    . Breast surgery     Family History:  Family History  Problem Relation Age of Onset  . Stroke Other   . Cancer  Other   . Hyperlipidemia Mother   . Hypertension Mother    Family Psychiatric  History: Substance abuse, depression Social History:  History  Alcohol Use No    Comment: former     History  Drug Use No    Comment: former    Social History   Social History  . Marital Status: Single    Spouse Name: N/A  . Number of Children: N/A  . Years of Education: N/A   Social History Main Topics  . Smoking status: Current Every Day Smoker -- 0.25 packs/day for 10 years    Types: Cigarettes  . Smokeless tobacco: Never Used  . Alcohol Use: No     Comment: former  . Drug Use: No     Comment: former  . Sexual Activity: Yes     Comment: anal   Other Topics Concern  . Not on file   Social History Narrative   Additional Social History:                          Allergies:   Allergies  Allergen Reactions  . Codeine Hives, Itching and Swelling  . Penicillins Swelling    Has patient had a PCN reaction causing immediate rash, facial/tongue/throat swelling, SOB or lightheadedness with hypotension: Yes Has patient had a PCN reaction causing severe rash involving mucus membranes or skin necrosis: Yes Has patient had a PCN reaction that required hospitalization Yes-ed visit Has patient had a PCN reaction occurring within the last 10 years: Yes If all of the above answers are "NO", then may proceed with Cephalosporin use.   . Depakote [Divalproex Sodium] Hives and Other (See Comments)    "Bug out and hallucinate"  . Morphine Itching  . Depakote Er [Divalproex Sodium Er] Hives  . Oxycodone Itching and Swelling  . Hydrocodone-Acetaminophen Itching and Rash    Labs:  Results for orders placed or performed during the hospital encounter of 04/14/15 (from the past 48 hour(s))  Comprehensive metabolic panel     Status: Abnormal   Collection Time: 04/14/15  4:32 PM  Result Value Ref Range   Sodium 142 135 - 145 mmol/L   Potassium 4.7 3.5 - 5.1 mmol/L   Chloride 109 101 - 111  mmol/L   CO2 21 (L) 22 - 32 mmol/L   Glucose, Bld 84 65 - 99 mg/dL   BUN 8 6 - 20 mg/dL   Creatinine, Ser 0.80 0.61 - 1.24 mg/dL   Calcium 9.1 8.9 - 10.3 mg/dL   Total Protein 7.7 6.5 - 8.1 g/dL   Albumin 4.1 3.5 - 5.0 g/dL   AST 350 (H) 15 - 41 U/L   ALT 394 (H) 17 - 63 U/L   Alkaline Phosphatase 68 38 - 126 U/L   Total Bilirubin 0.6 0.3 - 1.2 mg/dL   GFR calc non Af Amer >60 >60 mL/min   GFR calc Af Amer >60 >60 mL/min    Comment: (NOTE) The eGFR has been calculated using the CKD EPI equation. This calculation has not been validated in all clinical situations. eGFR's persistently <60 mL/min signify possible Chronic Kidney Disease.  Anion gap 12 5 - 15  Ethanol (ETOH)     Status: Abnormal   Collection Time: 04/14/15  4:46 PM  Result Value Ref Range   Alcohol, Ethyl (B) 312 (HH) <5 mg/dL    Comment:        LOWEST DETECTABLE LIMIT FOR SERUM ALCOHOL IS 5 mg/dL FOR MEDICAL PURPOSES ONLY CRITICAL RESULT CALLED TO, READ BACK BY AND VERIFIED WITH: TEUP,M @ 1724 ON [id number] BY POTEAT,S   Salicylate level     Status: None   Collection Time: 04/14/15  4:46 PM  Result Value Ref Range   Salicylate Lvl <8.3 2.8 - 30.0 mg/dL  Acetaminophen level     Status: Abnormal   Collection Time: 04/14/15  4:46 PM  Result Value Ref Range   Acetaminophen (Tylenol), Serum <10 (L) 10 - 30 ug/mL    Comment:        THERAPEUTIC CONCENTRATIONS VARY SIGNIFICANTLY. A RANGE OF 10-30 ug/mL MAY BE AN EFFECTIVE CONCENTRATION FOR MANY PATIENTS. HOWEVER, SOME ARE BEST TREATED AT CONCENTRATIONS OUTSIDE THIS RANGE. ACETAMINOPHEN CONCENTRATIONS >150 ug/mL AT 4 HOURS AFTER INGESTION AND >50 ug/mL AT 12 HOURS AFTER INGESTION ARE OFTEN ASSOCIATED WITH TOXIC REACTIONS.   Urine rapid drug screen (hosp performed) (Not at [facility])     Status: Abnormal   Collection Time: 04/14/15  5:00 PM  Result Value Ref Range   Opiates NONE DETECTED NONE DETECTED   Cocaine POSITIVE (A) NONE DETECTED   Benzodiazepines NONE  DETECTED NONE DETECTED   Amphetamines NONE DETECTED NONE DETECTED   Tetrahydrocannabinol NONE DETECTED NONE DETECTED   Barbiturates NONE DETECTED NONE DETECTED    Comment:        DRUG SCREEN FOR MEDICAL PURPOSES ONLY.  IF CONFIRMATION IS NEEDED FOR ANY PURPOSE, NOTIFY LAB WITHIN 5 DAYS.        LOWEST DETECTABLE LIMITS FOR URINE DRUG SCREEN Drug Class       Cutoff (ng/mL) Amphetamine      1000 Barbiturate      200 Benzodiazepine   419 Tricyclics       622 Opiates          300 Cocaine          300 THC              50   CBC     Status: Abnormal   Collection Time: 04/14/15  5:21 PM  Result Value Ref Range   WBC 5.2 4.0 - 10.5 K/uL   RBC 5.45 4.22 - 5.81 MIL/uL   Hemoglobin 16.4 13.0 - 17.0 g/dL   HCT 45.7 39.0 - 52.0 %   MCV 83.9 78.0 - 100.0 fL   MCH 30.1 26.0 - 34.0 pg   MCHC 35.9 30.0 - 36.0 g/dL   RDW 15.0 11.5 - 15.5 %   Platelets 122 (L) 150 - 400 K/uL    Current Facility-Administered Medications  Medication Dose Route Frequency Provider Last Rate Last Dose  . alum & mag hydroxide-simeth (MAALOX/MYLANTA) 200-200-20 MG/5ML suspension 30 mL  30 mL Oral Q4H PRN Delfin Gant, NP      . amLODipine (NORVASC) tablet 5 mg  5 mg Oral Daily Delfin Gant, NP   5 mg at 04/16/15 0825  . gabapentin (NEURONTIN) capsule 300 mg  300 mg Oral TID Delfin Gant, NP   300 mg at 04/16/15 0825  . hydrOXYzine (ATARAX/VISTARIL) tablet 25 mg  25 mg Oral Q6H PRN Hampton Abbot, MD   25 mg at 04/16/15 0651  .  loperamide (IMODIUM) capsule 2-4 mg  2-4 mg Oral PRN Archana Kumar, MD   4 mg at 04/15/15 1932  . LORazepam (ATIVAN) tablet 1 mg  1 mg Oral TID Archana Kumar, MD   1 mg at 04/16/15 0824   Followed by  . [START ON 04/17/2015] LORazepam (ATIVAN) tablet 1 mg  1 mg Oral BID Archana Kumar, MD       Followed by  . [START ON 04/18/2015] LORazepam (ATIVAN) tablet 1 mg  1 mg Oral Daily Archana Kumar, MD      . magnesium hydroxide (MILK OF MAGNESIA) suspension 30 mL  30 mL Oral Daily  PRN Josephine C Onuoha, NP      . metoprolol tartrate (LOPRESSOR) tablet 25 mg  25 mg Oral BID Josephine C Onuoha, NP   25 mg at 04/16/15 0825  . multivitamin with minerals tablet 1 tablet  1 tablet Oral Daily Archana Kumar, MD   1 tablet at 04/16/15 0825  . ondansetron (ZOFRAN-ODT) disintegrating tablet 4 mg  4 mg Oral Q6H PRN Archana Kumar, MD      . thiamine (VITAMIN B-1) tablet 100 mg  100 mg Oral Daily Archana Kumar, MD   100 mg at 04/16/15 0825  . traZODone (DESYREL) tablet 100 mg  100 mg Oral QHS PRN Josephine C Onuoha, NP   100 mg at 04/15/15 2131    Musculoskeletal: Strength & Muscle Tone: within normal limits Gait & Station: normal Patient leans: N/A  Psychiatric Specialty Exam: Review of Systems  Constitutional: Negative.   HENT: Negative.   Eyes: Negative.   Respiratory: Negative.   Cardiovascular: Negative.   Gastrointestinal: Negative.   Genitourinary: Negative.   Musculoskeletal: Negative.   Skin: Negative.   Neurological: Negative.   Endo/Heme/Allergies: Negative.   Psychiatric/Behavioral: Positive for depression and substance abuse.    Blood pressure 113/68, pulse 97, temperature 98.9 F (37.2 C), temperature source Oral, resp. rate 18, height 5' 6" (1.676 m), weight 68.947 kg (152 lb), SpO2 98 %.Body mass index is 24.55 kg/(m^2).  General Appearance: Casual  Eye Contact::  Good  Speech:  Normal Rate  Volume:  Normal  Mood:  Depressed, mild  Affect:  Congruent  Thought Process:  Coherent  Orientation:  Full (Time, Place, and Person)  Thought Content:  WDL  Suicidal Thoughts:  No  Homicidal Thoughts:  No  Memory:  Immediate;   Good Recent;   Good Remote;   Good  Judgement:  Good  Insight:  Good  Psychomotor Activity:  Normal  Concentration:  Good  Recall:  Good  Fund of Knowledge:Good  Language: Good  Akathisia:  No  Handed:  Right  AIMS (if indicated):     Assets:  Leisure Time Physical Health Resilience  ADL's:  Intact  Cognition: WNL   Sleep:  Number of Hours: 6.5   Treatment Plan Summary: Daily contact with patient to assess and evaluate symptoms and progress in treatment, Medication management and Plan Major depression, recurrent, mild, no psychotic symptoms: -Crisis stabilization -Alcohol detox protocol implemented with Ativan -Individual and substance abuse counseling provided -Resources to outpatient treatment, substance abuse, and homeless shelters provided  Disposition: No evidence of imminent risk to self or others at present.    LORD, JAMISON, PMH-NP 04/16/2015 9:24 AM  

## 2015-04-16 NOTE — Discharge Instructions (Signed)
Major Depressive Disorder °Major depressive disorder is a mental illness. It also may be called clinical depression or unipolar depression. Major depressive disorder usually causes feelings of sadness, hopelessness, or helplessness. Some people with this disorder do not feel particularly sad but lose interest in doing things they used to enjoy (anhedonia). Major depressive disorder also can cause physical symptoms. It can interfere with work, school, relationships, and other normal everyday activities. The disorder varies in severity but is longer lasting and more serious than the sadness we all feel from time to time in our lives. °Major depressive disorder often is triggered by stressful life events or major life changes. Examples of these triggers include divorce, loss of your job or home, a move, and the death of a family member or close friend. Sometimes this disorder occurs for no obvious reason at all. People who have family members with major depressive disorder or bipolar disorder are at higher risk for developing this disorder, with or without life stressors. Major depressive disorder can occur at any age. It may occur just once in your life (single episode major depressive disorder). It may occur multiple times (recurrent major depressive disorder). °SYMPTOMS °People with major depressive disorder have either anhedonia or depressed mood on nearly a daily basis for at least 2 weeks or longer. Symptoms of depressed mood include: °· Feelings of sadness (blue or down in the dumps) or emptiness. °· Feelings of hopelessness or helplessness. °· Tearfulness or episodes of crying (may be observed by others). °· Irritability (children and adolescents). °In addition to depressed mood or anhedonia or both, people with this disorder have at least four of the following symptoms: °· Difficulty sleeping or sleeping too much.   °· Significant change (increase or decrease) in appetite or weight.   °· Lack of energy or  motivation. °· Feelings of guilt and worthlessness.   °· Difficulty concentrating, remembering, or making decisions. °· Unusually slow movement (psychomotor retardation) or restlessness (as observed by others).   °· Recurrent wishes for death, recurrent thoughts of self-harm (suicide), or a suicide attempt. °People with major depressive disorder commonly have persistent negative thoughts about themselves, other people, and the world. People with severe major depressive disorder may experience distorted beliefs or perceptions about the world (psychotic delusions). They also may see or hear things that are not real (psychotic hallucinations). °DIAGNOSIS °Major depressive disorder is diagnosed through an assessment by your health care provider. Your health care provider will ask about aspects of your daily life, such as mood, sleep, and appetite, to see if you have the diagnostic symptoms of major depressive disorder. Your health care provider may ask about your medical history and use of alcohol or drugs, including prescription medicines. Your health care provider also may do a physical exam and blood work. This is because certain medical conditions and the use of certain substances can cause major depressive disorder-like symptoms (secondary depression). Your health care provider also may refer you to a mental health specialist for further evaluation and treatment. °TREATMENT °It is important to recognize the symptoms of major depressive disorder and seek treatment. The following treatments can be prescribed for this disorder:   °· Medicine. Antidepressant medicines usually are prescribed. Antidepressant medicines are thought to correct chemical imbalances in the brain that are commonly associated with major depressive disorder. Other types of medicine may be added if the symptoms do not respond to antidepressant medicines alone or if psychotic delusions or hallucinations occur. °· Talk therapy. Talk therapy can be  helpful in treating major depressive disorder by providing   support, education, and guidance. Certain types of talk therapy also can help with negative thinking (cognitive behavioral therapy) and with relationship issues that trigger this disorder (interpersonal therapy). °A mental health specialist can help determine which treatment is best for you. Most people with major depressive disorder do well with a combination of medicine and talk therapy. Treatments involving electrical stimulation of the brain can be used in situations with extremely severe symptoms or when medicine and talk therapy do not work over time. These treatments include electroconvulsive therapy, transcranial magnetic stimulation, and vagal nerve stimulation. °Document Released: 10/30/2012 Document Revised: 11/19/2013 Document Reviewed: 10/30/2012 °[employer]® Patient Information ©2015 [employer]. This information is not intended to replace advice given to you by your health care provider. Make sure you discuss any questions you have with your health care provider. ° °

## 2015-04-16 NOTE — Progress Notes (Signed)
Nursing DAR Note:  Patient pleasant, cooperative, compliant with medications, denies any current HI or AVH, rates depression currently at "6 or 7" and anxiety at "about five"; patient does admit to "still having some" SI but denies any plan. Nurse provding support and therapeutic communication and all scheduled meds; q 15 minute checks for safety continuous. Patient remains safe on the Unit and verbally contracts for safety.

## 2015-04-16 NOTE — [facility] Suicide Risk Assessment (Signed)
Suicide Risk Assessment  Discharge Assessment   [facility] Discharge Suicide Risk Assessment   Demographic Factors:  Male and Low socioeconomic status  Total Time spent with patient: 30 minutes  Musculoskeletal: Strength & Muscle Tone: within normal limits Gait & Station: normal Patient leans: N/A  Psychiatric Specialty Exam: Review of Systems  Constitutional: Negative.  HENT: Negative.  Eyes: Negative.  Respiratory: Negative.  Cardiovascular: Negative.  Gastrointestinal: Negative.  Genitourinary: Negative.  Musculoskeletal: Negative.  Skin: Negative.  Neurological: Negative.  Endo/Heme/Allergies: Negative.  Psychiatric/Behavioral: Positive for depression and substance abuse.    Blood pressure 113/68, pulse 97, temperature 98.9 F (37.2 C), temperature source Oral, resp. rate 18, height 5\' 6"  (1.676 m), weight 68.947 kg (152 lb), SpO2 98 %.Body mass index is 24.55 kg/(m^2).  General Appearance: Casual  Eye Contact:: Good  Speech: Normal Rate  Volume: Normal  Mood: Depressed, mild  Affect: Congruent  Thought Process: Coherent  Orientation: Full (Time, Place, and Person)  Thought Content: WDL  Suicidal Thoughts: No  Homicidal Thoughts: No  Memory: Immediate; Good Recent; Good Remote; Good  Judgement: Good  Insight: Good  Psychomotor Activity: Normal  Concentration: Good  Recall: Good  Fund of Knowledge:Good  Language: Good  Akathisia: No  Handed: Right  AIMS (if indicated):   Assets: Leisure Time Physical Health Resilience  ADL's: Intact  Cognition: WNL  Sleep: Number of Hours: 6.5        Have you used any form of tobacco in the last 30 days? (Cigarettes, Smokeless Tobacco, Cigars, and/or Pipes): Yes  Has this patient used any form of tobacco in the last 30 days? (Cigarettes, Smokeless Tobacco, Cigars, and/or Pipes) Yes, A prescription for an FDA-approved tobacco cessation medication was  offered at discharge and the patient refused  Mental Status Per Nursing Assessment::   On Admission:  NA  Current Mental Status by Physician: NA  Loss Factors: Financial problems/change in socioeconomic status  Historical Factors: Family history of mental illness or substance abuse  Risk Reduction Factors:   Sense of responsibility to family, Living with another person, especially a relative and Positive coping skills or problem solving skills  Continued Clinical Symptoms:  Depression, mild  Cognitive Features That Contribute To Risk:  None    Suicide Risk:  Minimal: No identifiable suicidal ideation.  Patients presenting with no risk factors but with morbid ruminations; may be classified as minimal risk based on the severity of the depressive symptoms  Principal Problem: Major depressive disorder, recurrent episode Discharge Diagnoses:  Patient Active Problem List   Diagnosis Date Noted  . Alcohol withdrawal [F10.239] 08/02/2013    Priority: High  . Alcohol dependence [F10.20] 03/29/2013    Priority: High  . PTSD (post-traumatic stress disorder) [F43.10] 07/06/2012    Priority: High  . Major depressive disorder, recurrent episode [F33.9] 06/29/2012    Priority: High    Class: Acute  . Alcohol use disorder, severe, dependence [F10.20] 04/15/2015  . Alcohol abuse with intoxication [F10.129] 04/11/2015  . Cocaine abuse with cocaine-induced mood disorder [F14.14] 04/11/2015  . Overdose [T50.901A] 04/10/2015  . 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 Severe major depression with psychotic features [F32.3] 09/11/2014  . 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 Suicidal ideation [R45.851]   . Tylenol overdose [T39.1X4A]   . Polysubstance abuse [F19.10]   . Overdose of acetaminophen [T39.1X4A] 08/03/2014  . Overdose by acetaminophen [T39.1X4A] 08/03/2014  . Cocaine abuse [F14.10] 04/16/2014  . Alcohol dependence with  withdrawal [F10.239] 04/15/2014  .  Thrombocytopenia [D69.6] 04/15/2014  . Urinary tract infection, site not specified [N39.0] 04/15/2014  . Chest pain, unspecified [R07.9] 04/15/2014  . D-dimer, elevated [R79.1] 04/15/2014  . Alcohol abuse [F10.10] 01/29/2014  . Alcohol abuse with alcohol-induced mood disorder [F10.14] 01/28/2014  . Transaminitis [R74.0] 09/24/2013  . Alcohol intoxication [F10.129] 09/22/2013  . Cocaine dependence [F14.20] 09/20/2013  . 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 Seizure [R56.9] 03/15/2013  . Syncope [R55] 01/02/2013  . Leukocytopenia, unspecified [D72.819] 01/02/2013  . Left kidney mass [N28.89] 12/24/2012  . Peripheral vascular disease [I73.9] 01/14/2012  . SEIZURE DISORDER [R56.9] 10/03/2008  . LIVER FUNCTION TESTS, ABNORMAL [R94.5] 12/29/2007  . HYPERCHOLESTEROLEMIA [E78.0] 03/21/2007  . Essential hypertension [I10] 03/21/2007      Plan Of Care/Follow-up recommendations:  Activity:  as tolerated  Diet:  heart healty diet  Is patient on multiple antipsychotic therapies at discharge:  No   Has Patient had three or more failed trials of antipsychotic monotherapy by history:  No  Recommended Plan for Multiple Antipsychotic Therapies: NA    LORD, JAMISON, PMH-NP 04/16/2015, 9:43 AM
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
## 2015-05-27 IMAGING — CR DG ABDOMEN ACUTE W/ 1V CHEST
3 series · 3 of 3 positions shown · non-contrast
Comparison: 08/10/2012

CLINICAL DATA: Chest pain and vomiting.

ACUTE ABDOMEN SERIES (ABDOMEN 2 VIEW & CHEST 1 VIEW)

[w chest pa]
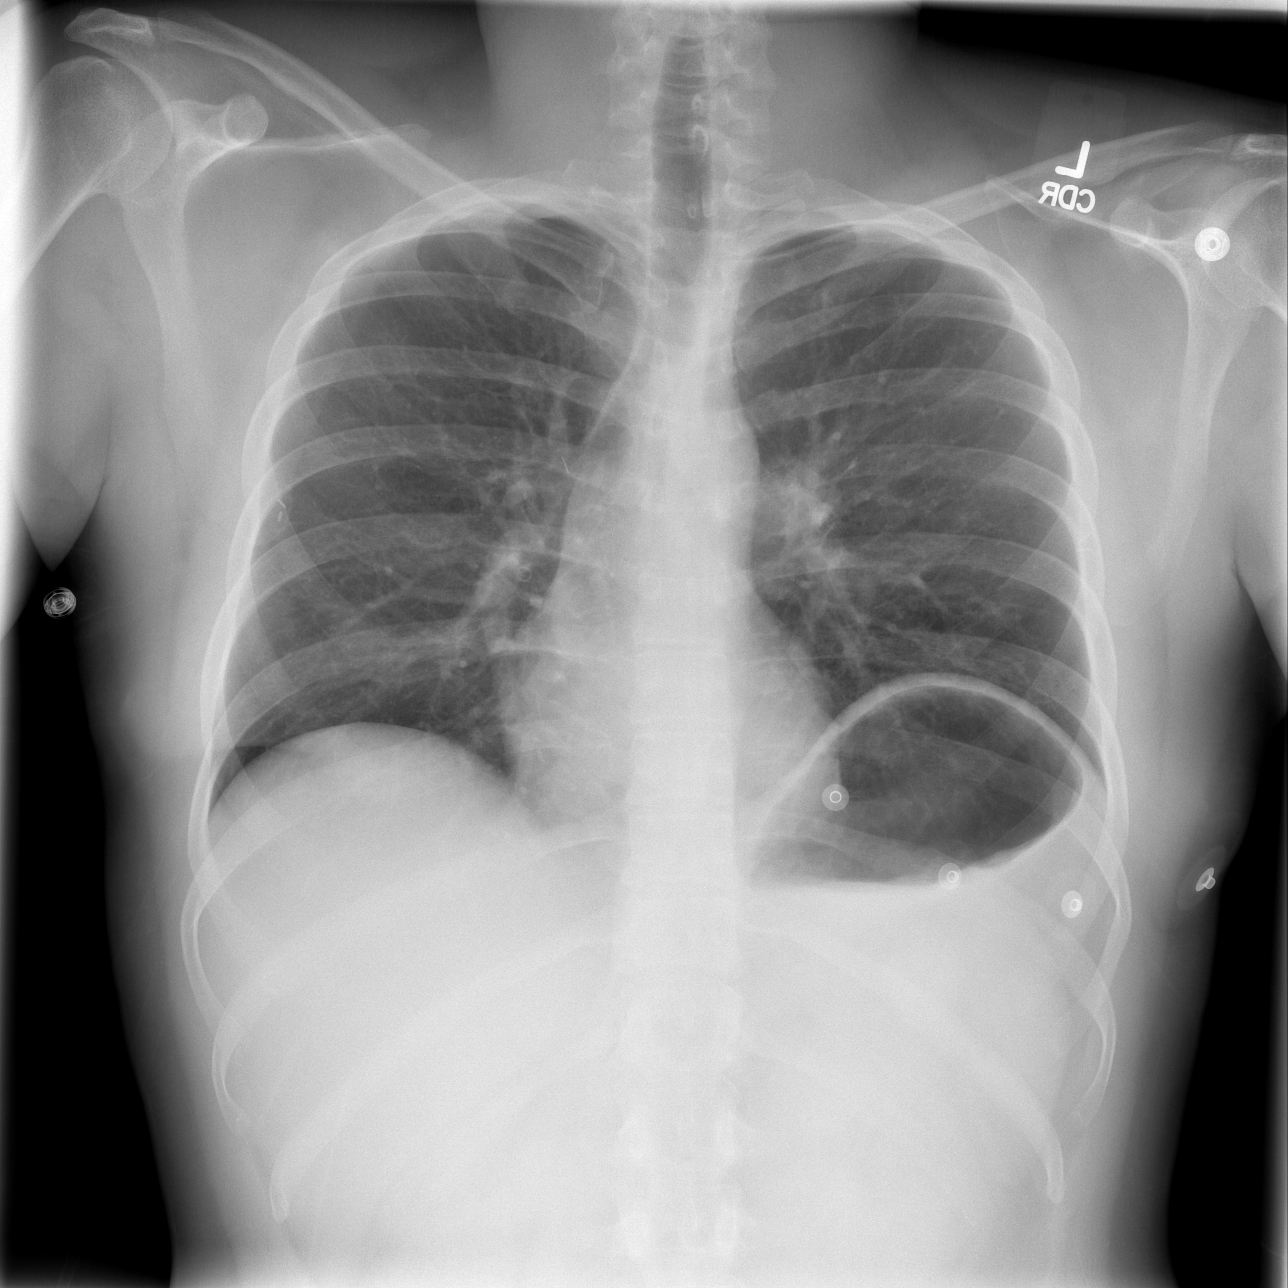

[w abdomen upright]
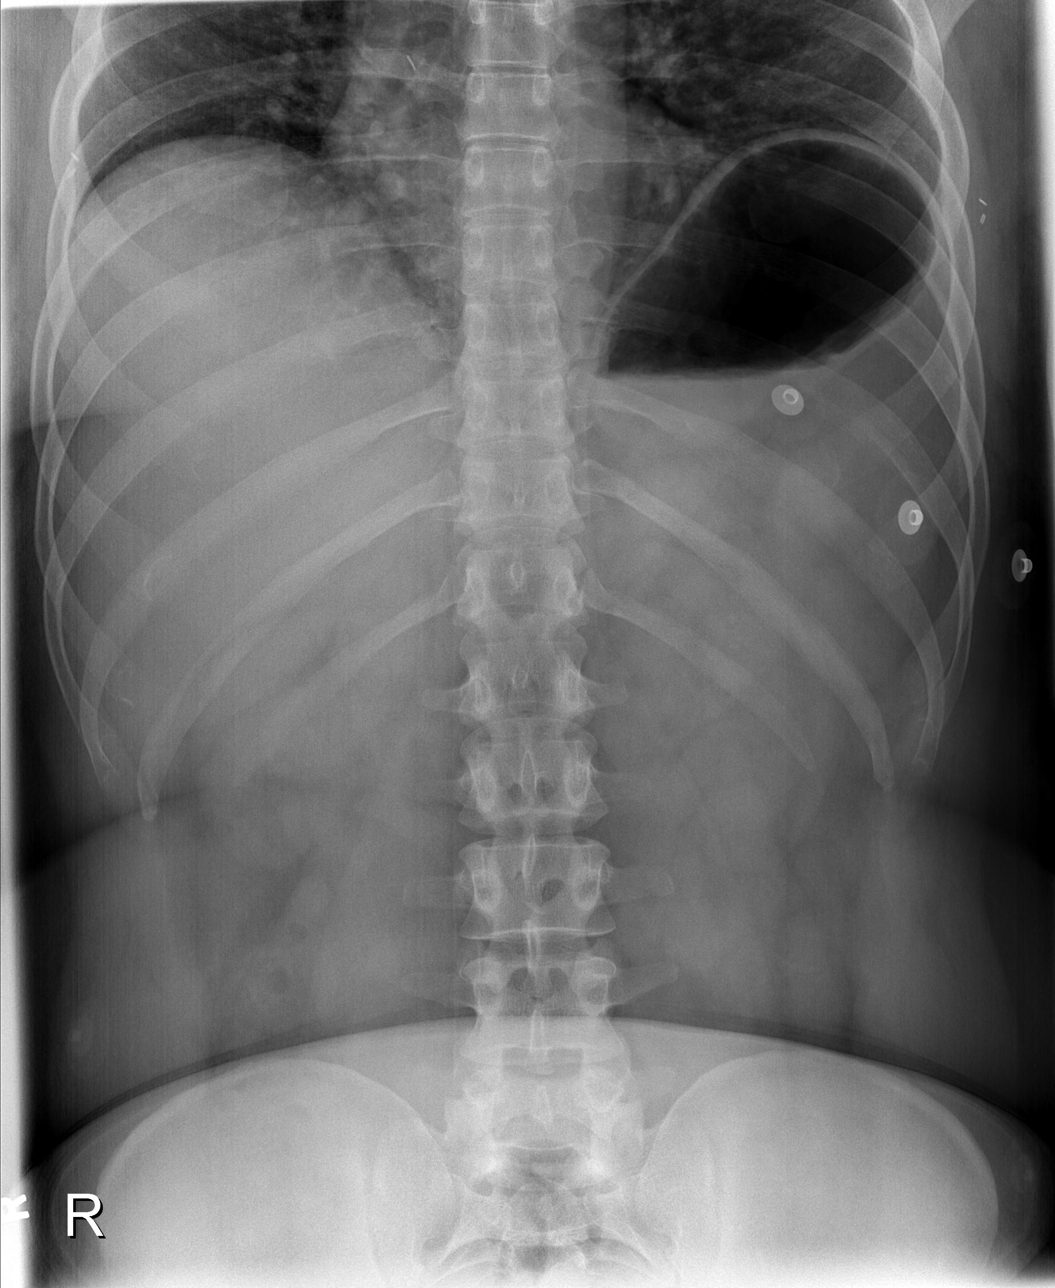

[t abdomen supine]
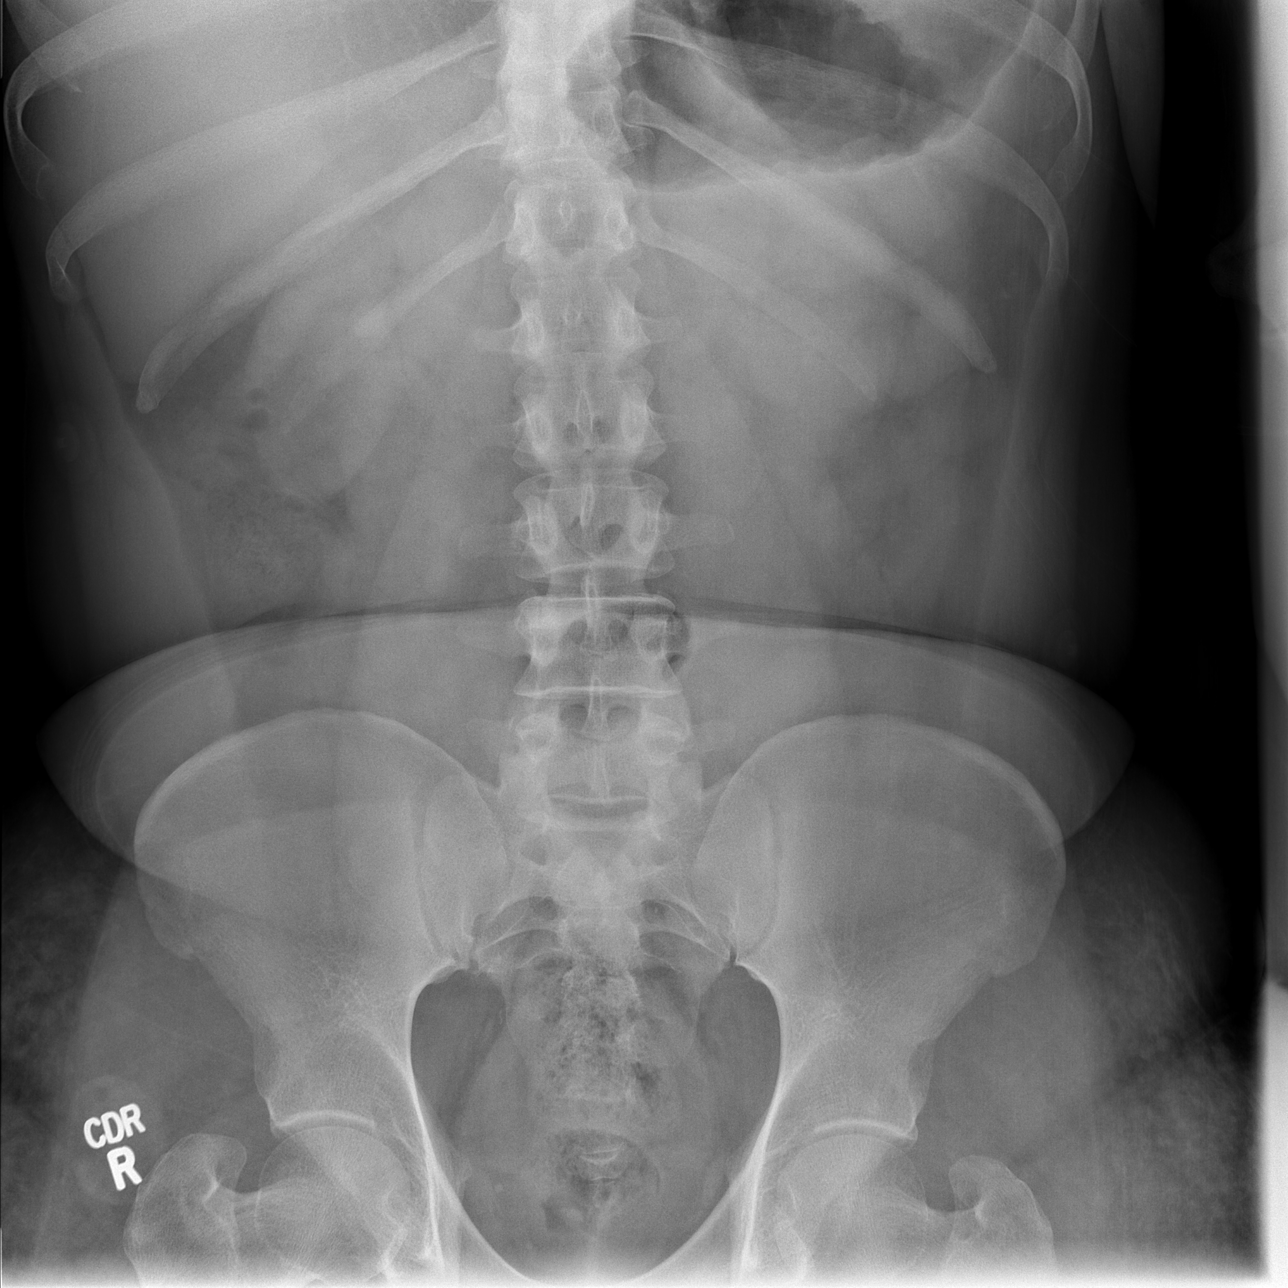

[3 of 3 positions shown; findings below may reference images not displayed]

FINDINGS: The cardiomediastinal silhouette is unremarkable.
There is no evidence of airspace disease, pleural effusion or
pneumothorax.

A paucity of gas within the abdomen is noted.
There is no evidence of pneumoperitoneum.
No dilated bowel loops are identified.
No acute bony abnormalities are present.
No suspicious calcifications are noted.
IMPRESSION: No evidence of acute or significant abnormality.

## 2015-06-02 ENCOUNTER — Emergency Department (HOSPITAL_COMMUNITY)
Admission: EM | Admit: 2015-06-02 | Discharge: 2015-06-03 | Disposition: A | Discharge status: Home / Self Care | Payer: Self-pay | Attending: Emergency Medicine | Admitting: Emergency Medicine

## 2015-06-02 ENCOUNTER — Emergency Department (HOSPITAL_COMMUNITY): Payer: No Typology Code available for payment source

## 2015-06-02 ENCOUNTER — Encounter (HOSPITAL_COMMUNITY): Payer: Self-pay | Admitting: Emergency Medicine

## 2015-06-02 ENCOUNTER — Emergency Department (HOSPITAL_COMMUNITY): Payer: Self-pay

## 2015-06-02 DIAGNOSIS — R109 Unspecified abdominal pain: Secondary | ICD-10-CM | POA: Insufficient documentation

## 2015-06-02 DIAGNOSIS — Z85528 Personal history of other malignant neoplasm of kidney: Secondary | ICD-10-CM | POA: Insufficient documentation

## 2015-06-02 DIAGNOSIS — R51 Headache: Secondary | ICD-10-CM | POA: Insufficient documentation

## 2015-06-02 DIAGNOSIS — Z79899 Other long term (current) drug therapy: Secondary | ICD-10-CM | POA: Insufficient documentation

## 2015-06-02 DIAGNOSIS — R112 Nausea with vomiting, unspecified: Secondary | ICD-10-CM | POA: Insufficient documentation

## 2015-06-02 DIAGNOSIS — F319 Bipolar disorder, unspecified: Secondary | ICD-10-CM | POA: Insufficient documentation

## 2015-06-02 DIAGNOSIS — Z853 Personal history of malignant neoplasm of breast: Secondary | ICD-10-CM | POA: Insufficient documentation

## 2015-06-02 DIAGNOSIS — I10 Essential (primary) hypertension: Secondary | ICD-10-CM | POA: Insufficient documentation

## 2015-06-02 DIAGNOSIS — F209 Schizophrenia, unspecified: Secondary | ICD-10-CM | POA: Insufficient documentation

## 2015-06-02 DIAGNOSIS — R079 Chest pain, unspecified: Secondary | ICD-10-CM | POA: Insufficient documentation

## 2015-06-02 DIAGNOSIS — Z9889 Other specified postprocedural states: Secondary | ICD-10-CM | POA: Insufficient documentation

## 2015-06-02 DIAGNOSIS — Z880 Allergy status to penicillin: Secondary | ICD-10-CM | POA: Insufficient documentation

## 2015-06-02 DIAGNOSIS — F17210 Nicotine dependence, cigarettes, uncomplicated: Secondary | ICD-10-CM | POA: Insufficient documentation

## 2015-06-02 DIAGNOSIS — Z8719 Personal history of other diseases of the digestive system: Secondary | ICD-10-CM | POA: Insufficient documentation

## 2015-06-02 DIAGNOSIS — F1410 Cocaine abuse, uncomplicated: Secondary | ICD-10-CM | POA: Insufficient documentation

## 2015-06-02 DIAGNOSIS — I25119 Atherosclerotic heart disease of native coronary artery with unspecified angina pectoris: Secondary | ICD-10-CM | POA: Insufficient documentation

## 2015-06-02 LAB — RAPID URINE DRUG SCREEN, HOSP PERFORMED
Amphetamines: NOT DETECTED
BARBITURATES: NOT DETECTED
BENZODIAZEPINES: NOT DETECTED
COCAINE: POSITIVE — AB
OPIATES: NOT DETECTED
Tetrahydrocannabinol: NOT DETECTED

## 2015-06-02 LAB — COMPREHENSIVE METABOLIC PANEL
ALT: 236 U/L — ABNORMAL HIGH (ref 17–63)
ANION GAP: 16 — AB (ref 5–15)
AST: 277 U/L — AB (ref 15–41)
Albumin: 4.4 g/dL (ref 3.5–5.0)
Alkaline Phosphatase: 52 U/L (ref 38–126)
BILIRUBIN TOTAL: 1.3 mg/dL — AB (ref 0.3–1.2)
BUN: 14 mg/dL (ref 6–20)
CHLORIDE: 104 mmol/L (ref 101–111)
CO2: 21 mmol/L — ABNORMAL LOW (ref 22–32)
Calcium: 9.7 mg/dL (ref 8.9–10.3)
Creatinine, Ser: 0.9 mg/dL (ref 0.61–1.24)
Glucose, Bld: 131 mg/dL — ABNORMAL HIGH (ref 65–99)
POTASSIUM: 3.8 mmol/L (ref 3.5–5.1)
Sodium: 141 mmol/L (ref 135–145)
TOTAL PROTEIN: 7.5 g/dL (ref 6.5–8.1)

## 2015-06-02 LAB — URINALYSIS, ROUTINE W REFLEX MICROSCOPIC
Glucose, UA: NEGATIVE mg/dL
Hgb urine dipstick: NEGATIVE
NITRITE: NEGATIVE
PROTEIN: 30 mg/dL — AB
Specific Gravity, Urine: 1.029 (ref 1.005–1.030)
UROBILINOGEN UA: 1 mg/dL (ref 0.0–1.0)
pH: 7.5 (ref 5.0–8.0)

## 2015-06-02 LAB — URINE MICROSCOPIC-ADD ON

## 2015-06-02 LAB — LIPASE, BLOOD: LIPASE: 34 U/L (ref 11–51)

## 2015-06-02 LAB — TROPONIN I: Troponin I: <0.03 ng/mL (ref <0.031)

## 2015-06-02 LAB — CBC
HCT: 45.8 % (ref 39.0–52.0)
Hemoglobin: 16 g/dL (ref 13.0–17.0)
MCH: 30.9 pg (ref 26.0–34.0)
MCHC: 34.9 g/dL (ref 30.0–36.0)
MCV: 88.4 fL (ref 78.0–100.0)
Platelets: 218 10*3/uL (ref 150–400)
RBC: 5.18 MIL/uL (ref 4.22–5.81)
RDW: 16 % — ABNORMAL HIGH (ref 11.5–15.5)
WBC: 5.6 10*3/uL (ref 4.0–10.5)

## 2015-06-02 LAB — ETHANOL

## 2015-06-02 LAB — D-DIMER, QUANTITATIVE: D-Dimer, Quant: 0.63 ug/mL-FEU — ABNORMAL HIGH (ref 0.00–0.48)

## 2015-06-02 MED ORDER — DIPHENHYDRAMINE HCL 25 MG PO CAPS
25.0000 mg | ORAL_CAPSULE | Freq: Once | ORAL | Status: AC
Start: 2015-06-02 — End: 2015-06-02
  Administered 2015-06-02: 25 mg via ORAL
  Filled 2015-06-02: qty 1

## 2015-06-02 MED ORDER — ONDANSETRON 4 MG PO TBDP
4.0000 mg | ORAL_TABLET | Freq: Once | ORAL | Status: AC | PRN
  Administered 2015-06-02: 4 mg via ORAL
  Filled 2015-06-02: qty 1

## 2015-06-02 MED ORDER — FENTANYL CITRATE (PF) 100 MCG/2ML IJ SOLN
100.0000 ug | Freq: Once | INTRAMUSCULAR | Status: AC
  Administered 2015-06-02: 100 ug via INTRAVENOUS
  Filled 2015-06-02: qty 2

## 2015-06-02 MED ORDER — SODIUM CHLORIDE 0.9 % IV BOLUS (SEPSIS)
1000.0000 mL | Freq: Once | INTRAVENOUS | Status: AC
  Administered 2015-06-02: 1000 mL via INTRAVENOUS

## 2015-06-02 MED ORDER — METOCLOPRAMIDE HCL 5 MG/ML IJ SOLN
10.0000 mg | Freq: Once | INTRAMUSCULAR | Status: AC
  Administered 2015-06-02: 10 mg via INTRAVENOUS
  Filled 2015-06-02: qty 2

## 2015-06-02 MED ORDER — LORAZEPAM 2 MG/ML IJ SOLN
1.0000 mg | Freq: Once | INTRAMUSCULAR | Status: AC
  Administered 2015-06-02: 1 mg via INTRAVENOUS
  Filled 2015-06-02: qty 1

## 2015-06-02 NOTE — ED Notes (Signed)
Attempted to collect troponin, however unsuccessful. RN aware

## 2015-06-02 NOTE — ED Notes (Signed)
Pt in radiology 

## 2015-06-02 NOTE — ED Notes (Signed)
Pt at the door of his room, stating he feels like the room is closing in on him after he was given pain meds. RN and this Probation officer at bedside.

## 2015-06-02 NOTE — ED Notes (Signed)
Approx 20 min after administration of pain medication, patient began c/o itching all over and feeling anxious.  Lungs clear, no c/o throat swelling.  PA notified.

## 2015-06-02 NOTE — ED Notes (Signed)
Pt ambulated to restroom to try and void urine sample. Pt couldn't go stating he was dehydrated

## 2015-06-02 NOTE — ED Notes (Signed)
Patient is asking to be seen for detox from alcohol - PA notified.

## 2015-06-02 NOTE — ED Notes (Signed)
Patient states he feels like he is having withdrawal symptoms from crack and alcohol.  PA notified.

## 2015-06-02 NOTE — Discharge Instructions (Signed)
1. Medications: Continue usual home medications 2. Treatment: rest, drink plenty of fluids, Eat bland food that is easy on your stomach such as bananas, rice, applesauce, toast, and yogurt for the next 2-3 days.  3. Follow Up: Please follow up with your primary doctor in 3 days for discussion of your diagnoses and further evaluation after today's visit; Please return to the ER for any new or worsening symptoms, any additional concerns.    Behavioral Health Resources in the [facility]  Intensive Outpatient Programs: [facility]      [place]. [place], [place] Both a day and evening program       [facility] Outpatient     [address]        [place], [place] [address] [phone]         ADS: Alcohol & Drug Sv[facility] [place]: [phone] or [phone] [address] [place], [place] [address] [email or web address]  Mobile Crisis Teams:                                        [facility]         Mobile Crisis Care Unit [phone]             [facility] [address] [place] [address] [place] [phone]  Self-Help/Support Groups: [facility] [employer] [facility] [employer] of support groups [phone] (call for more info)  Narcotics Anonymous (NA) [facility] [address] [place] - 2 meetings at this location  Residential Treatment Programs:  [place]       [place] [address] [place], [place]  [address] [place]  [address] [place], [place] [address] [phone] Admissions:  8am-3pm M-F  Incentives Substance [place]     [address],  [address]       [phone]         [facility] [address] Jadene Pierini [place], [place]  [facility] [address] [place], [place]  [facility] - Intensive Outpatient      [phone]     [place], [place]         [facility] ([place]     [place], [place] or [phone]  Residential Treatment Services (RTS)  [place], [place]  [facility] [place]  [facility] Resources: [phone]               General Therapy                                                Domenic Schwab, PhD        [address] [place], [place] [address]         [facility]   [address] [place], [place] [address] [phone]  [facility] [address] [place], [place] [address] [phone] Insurance/Medicaid/sponsorship through [facility] and Families                                              [address] [place]                                        [place], [place] [address]    Therapy/tele-psych/case         [place] [address], [place]  [address]  Adolescent/group home/case management [phone]                                           Rosette Reveal PhD       General therapy       Insurance   [phone]         Dr. Adele Schilder Insurance [phone] M-F  [place] Detox/Residential Medicaid, sponsorship [phone]

## 2015-06-02 NOTE — ED Provider Notes (Signed)
CSN: [id number]     Arrival date & time 06/02/15  1632 History   First MD Initiated Contact with Patient 06/02/15 1808     Chief Complaint  Patient presents with  . Chest Pain  . Emesis     (Consider location/radiation/quality/duration/timing/severity/associated sxs/prior Treatment) HPI  Noxx Reeck is a 34 y.o. male  with a PMH of seizures, pancreatitis, HTN, CAD presents to the Emergency Department complaining of gradual onset of sharp, constant chest pain that started this morning. Associated symptoms include multiple episodes of NBNB emesis, abdominal pain, and headache. Patient denies any alleviating or aggravating factors. No history of similar events. 8/10 on pain scale.    Past Medical History  Diagnosis Date  . Seizures ([place])   . Hypertension   . Depression   . Pancreatitis   . Liver cirrhosis ([place])   . Coronary artery disease   . Angina   . Shortness of breath   . Headache(784.0)   . Peripheral vascular disease [facility]) April 2011    Left Pop  . Hypercholesteremia   . Schizophrenia ([place])   . Bipolar 1 disorder ([place])   . Cancer of kidney ([place]) dx'd 04/2013    lt nephrectomy  . Breast CA ([place]) dx'd 2009    bil w/ bil masectomy and oral meds  . H/O suicide attempt 2015    overdose   Past Surgical History  Procedure Laterality Date  . Chest surgery    . Left leg surgery    . Mastectomy    . Left kidney removal    . Breast surgery     Family History  Problem Relation Age of Onset  . Stroke Other   . Cancer Other   . Hyperlipidemia Mother   . Hypertension Mother    Social History  Substance Use Topics  . Smoking status: Current Every Day Smoker -- 0.25 packs/day for 10 years    Types: Cigarettes  . Smokeless tobacco: Never Used  . Alcohol Use: 0.0 oz/week    Review of Systems  Constitutional: Negative.   HENT: Negative for congestion, rhinorrhea and sore throat.   Eyes: Negative for visual disturbance.  Respiratory: Negative for cough, shortness of  breath and wheezing.   Cardiovascular: Positive for chest pain. Negative for palpitations and leg swelling.  Gastrointestinal: Positive for nausea, vomiting and abdominal pain. Negative for diarrhea and constipation.  Endocrine: Negative for polydipsia and polyuria.  Musculoskeletal: Negative for myalgias, back pain, arthralgias and neck pain.  Skin: Negative for rash.  Neurological: Positive for headaches. Negative for dizziness and weakness.      Allergies  Codeine; Penicillins; Depakote; Morphine; Depakote er; Oxycodone; and Hydrocodone-acetaminophen  Home Medications   Prior to Admission medications   Medication Sig Start Date End Date Taking? Authorizing Provider  amLODipine (NORVASC) 5 MG tablet Take 1 tablet (5 mg total) by mouth daily. 04/16/15  Yes [name], NP  ARIPiprazole (ABILIFY) 10 MG tablet Take 1 tablet (10 mg total) by mouth daily. 04/16/15  Yes [name], NP  atorvastatin (LIPITOR) 20 MG tablet Take 25 mg by mouth every morning.   Yes Historical Provider, MD  DULoxetine (CYMBALTA) 30 MG capsule Take 1 capsule (30 mg total) by mouth daily. 09/13/14  Yes [name], NP  gabapentin (NEURONTIN) 300 MG capsule Take 1 capsule (300 mg total) by mouth 3 (three) times daily. 04/16/15  Yes [name], NP  hydrOXYzine (ATARAX/VISTARIL) 25 MG tablet Take 1 tablet (25 mg total) by mouth  3 (three) times daily. 04/16/15  Yes [name], NP  metoprolol tartrate (LOPRESSOR) 25 MG tablet Take 1 tablet (25 mg total) by mouth 2 (two) times daily. 04/16/15  Yes [name], NP  traZODone (DESYREL) 100 MG tablet Take 1 tablet (100 mg total) by mouth at bedtime. 04/16/15  Yes [name], NP   BP 147/94 mmHg  Pulse 92  Temp(Src) 97.7 F (36.5 C) (Oral)  Resp 16  Ht 5\' 6"  (1.676 m)  Wt 155 lb (70.308 kg)  BMI 25.03 kg/m2  SpO2 100% Physical Exam  Constitutional: He is oriented to person, place, and time. He appears well-developed and well-nourished.  Appears  uncomfortable but in no acute distress  HENT:  Head: Normocephalic and atraumatic.  Cardiovascular: Normal rate, regular rhythm, normal heart sounds and intact distal pulses.  Exam reveals no gallop and no friction rub.   No murmur heard. Pulmonary/Chest: Effort normal and breath sounds normal. No respiratory distress. He has no wheezes. He has no rales. He exhibits no tenderness.  Abdominal: He exhibits no mass. There is no rebound and no guarding.  Abdomen soft, non-distended Generalized abdominal tenderness; worst at umbilicus Bowel sounds positive in all four quadrants  Musculoskeletal: He exhibits no edema.  Neurological: He is alert and oriented to person, place, and time.  Skin: Skin is warm and dry. No rash noted.  Psychiatric: He has a normal mood and affect. His behavior is normal. Judgment and thought content normal.  Nursing note and vitals reviewed.   ED Course  Procedures (including critical care time) Labs Review Labs Reviewed  CBC - Abnormal; Notable for the following:    RDW 16.0 (*)    All other components within normal limits  COMPREHENSIVE METABOLIC PANEL - Abnormal; Notable for the following:    CO2 21 (*)    Glucose, Bld 131 (*)    AST 277 (*)    ALT 236 (*)    Total Bilirubin 1.3 (*)    Anion gap 16 (*)    All other components within normal limits  URINALYSIS, ROUTINE W REFLEX MICROSCOPIC (NOT AT [facility]) - Abnormal; Notable for the following:    Color, Urine AMBER (*)    APPearance CLOUDY (*)    Bilirubin Urine SMALL (*)    Ketones, ur >80 (*)    Protein, ur 30 (*)    Leukocytes, UA TRACE (*)    All other components within normal limits  URINE RAPID DRUG SCREEN, HOSP PERFORMED - Abnormal; Notable for the following:    Cocaine POSITIVE (*)    All other components within normal limits  D-DIMER, QUANTITATIVE (NOT AT [facility]) - Abnormal; Notable for the following:    D-Dimer, Quant 0.63 (*)    All other components within normal limits  TROPONIN I  LIPASE,  BLOOD  ETHANOL  TROPONIN I  URINE MICROSCOPIC-ADD ON    Imaging Review Dg Chest 2 View  06/02/2015  CLINICAL DATA:  Mid chest pain beginning today. Diaphoresis and shortness of breath. Emesis. Personal history of coronary artery disease and hypertension. History of breast cancer with mastectomy. EXAM: CHEST  2 VIEW COMPARISON:  04/14/2015 FINDINGS: Heart size is normal. Mediastinal shadows are normal. The lungs are clear. The vascularity is normal. No effusions. Chest wall deformity related to previous surgery. No bone abnormality. IMPRESSION: No active cardiopulmonary disease. Electronically Signed   By: Nelson Chimes M.D.   On: 06/02/2015 17:54   Ct Angio Chest Pe W/cm &/or Wo Cm  06/02/2015  CLINICAL DATA:  Mid chest pain beginning this morning. Abdominal pain. Diaphoresis. Emesis. EXAM: CT ANGIOGRAPHY CHEST WITH CONTRAST TECHNIQUE: Multidetector CT imaging of the chest was performed using the standard protocol during bolus administration of intravenous contrast. Multiplanar CT image reconstructions and MIPs were obtained to evaluate the vascular anatomy. CONTRAST:  100 cc Omnipaque 350 COMPARISON:  Chest radiography same day.  CT 04/15/2014. FINDINGS: Pulmonary arterial opacification is excellent. There are no pulmonary emboli. No evidence of aortic pathology. No mediastinal or hilar mass or lymphadenopathy. The lungs are clear except for minimal patchy density in the right middle lobe. This could represent atelectasis or minimal right middle lobe pneumonia. No evidence of mass. No other focal parenchymal finding. Scans in the upper abdomen again show advanced fatty change of the liver. Postoperative appearance of the anterior soft tissues of the chest is the same as on the previous exam. Review of the MIP images confirms the above findings. IMPRESSION: No pulmonary emboli. Minimal patchy density in the right middle lobe that could be atelectasis or minimal right middle lobe pneumonia. Re-  demonstration of severe fatty change of the liver. Electronically Signed   By: Nelson Chimes M.D.   On: 06/02/2015 22:05   I have personally reviewed and evaluated these images and lab results as part of my medical decision-making.   EKG Interpretation   Date/Time:  Monday June 02 2015 16:43:14 EST Ventricular Rate:  102 PR Interval:  128 QRS Duration: 85 QT Interval:  440 QTC Calculation: 573 R Axis:   74 Text Interpretation:  Sinus tachycardia Borderline T abnormalities,  inferior leads Prolonged QT interval Baseline wander in lead(s) II III aVF  V2 changes noted compared to Sept 26 2016 Confirmed by Regenia Skeeter  MD, [place]  [phone]) on 06/02/2015 6:27:31 PM      MDM   Final diagnoses:  Chest pain, unspecified chest pain type  Non-intractable vomiting with nausea, vomiting of unspecified type  Cocaine abuse   Samarion Coxwell presents with chest pain and abdominal pain.   Labs: 1st & 2nd Trop <0.03, Lipase wdl; CBC reassuring; Ethanol < 5; UDS + for cocaine D-dimer slightly elevated at 0.63  -- CT angio shows no PE CXR shows no acute cardiopulm dz  11:10 PM - Patient re-evaluated and states he is feeling much improved and feels able to go home. We discussed detox, and I informed him that I will give him resources for places to go, and encouraged him to do so. Patient will be discharge home in good and stable condition   Filed Vitals:   06/02/15 2301  BP: 147/94  Pulse: 92  Temp:   Resp: 16    Patient seen by and discussed with Dr. Regenia Skeeter who agrees with treatment plan.    [facility] Kiahna Banghart, PA-C 06/02/15 [id number]  Sherwood Gambler, MD 06/04/15 [phone]

## 2015-06-02 NOTE — ED Notes (Signed)
Patient states his mid chest pain started this morning. Patient reports mid abdominal pain, diaphoresis, emesis since this morning as well. Denies diarrhea.

## 2015-06-02 NOTE — ED Notes (Signed)
IV in left antecubital area not flowing - attempted to flush without success. Patient c/o pain at IV site.  IV removed and new IV will be inserted.

## 2015-06-21 IMAGING — CR DG CHEST 1V PORT
1 series · 1 of 1 positions shown · non-contrast
Comparison: 12/23/2012.

CLINICAL DATA: Mid to lower chest pain.  History of bilateral
mastectomy.

PORTABLE CHEST - 1 VIEW

[AP]
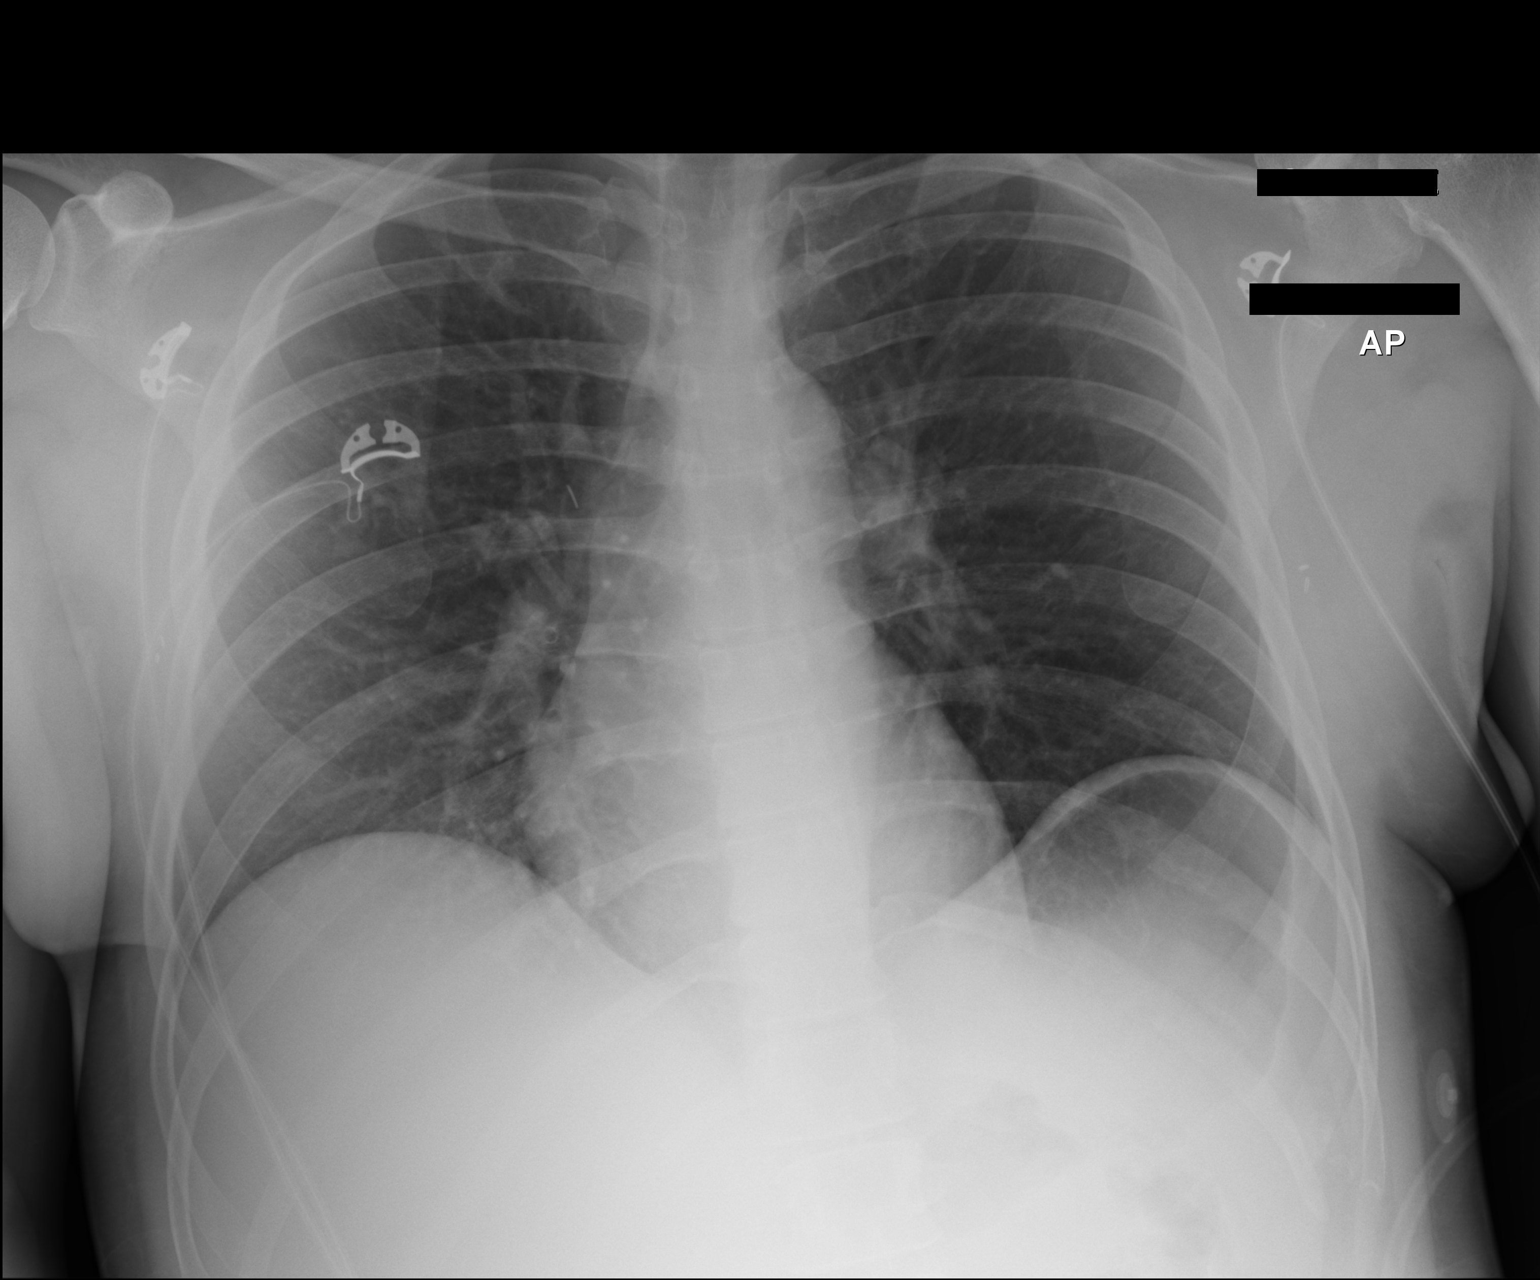

[1 of 1 positions shown; findings below may reference images not displayed]

FINDINGS: Surgical clips present over the chest.  Cardiopericardial
silhouette within normal limits. Mediastinal contours normal.
Trachea midline.  No airspace disease or effusion. Monitoring leads
are projected over the chest.
IMPRESSION: No active cardiopulmonary disease.

## 2015-07-09 ENCOUNTER — Emergency Department (HOSPITAL_COMMUNITY)
Admission: EM | Admit: 2015-07-09 | Discharge: 2015-07-09 | Disposition: A | Discharge status: Home / Self Care | Payer: No Typology Code available for payment source | Attending: Emergency Medicine | Admitting: Emergency Medicine

## 2015-07-09 ENCOUNTER — Encounter (HOSPITAL_COMMUNITY): Payer: Self-pay | Admitting: *Deleted

## 2015-07-09 DIAGNOSIS — E7800 Pure hypercholesterolemia, unspecified: Secondary | ICD-10-CM | POA: Insufficient documentation

## 2015-07-09 DIAGNOSIS — T7840XA Allergy, unspecified, initial encounter: Secondary | ICD-10-CM | POA: Insufficient documentation

## 2015-07-09 DIAGNOSIS — Y9389 Activity, other specified: Secondary | ICD-10-CM | POA: Insufficient documentation

## 2015-07-09 DIAGNOSIS — Z880 Allergy status to penicillin: Secondary | ICD-10-CM | POA: Insufficient documentation

## 2015-07-09 DIAGNOSIS — Z85528 Personal history of other malignant neoplasm of kidney: Secondary | ICD-10-CM | POA: Insufficient documentation

## 2015-07-09 DIAGNOSIS — I25119 Atherosclerotic heart disease of native coronary artery with unspecified angina pectoris: Secondary | ICD-10-CM | POA: Insufficient documentation

## 2015-07-09 DIAGNOSIS — F17210 Nicotine dependence, cigarettes, uncomplicated: Secondary | ICD-10-CM | POA: Insufficient documentation

## 2015-07-09 DIAGNOSIS — Z79899 Other long term (current) drug therapy: Secondary | ICD-10-CM | POA: Insufficient documentation

## 2015-07-09 DIAGNOSIS — Z915 Personal history of self-harm: Secondary | ICD-10-CM | POA: Insufficient documentation

## 2015-07-09 DIAGNOSIS — Z8719 Personal history of other diseases of the digestive system: Secondary | ICD-10-CM | POA: Insufficient documentation

## 2015-07-09 DIAGNOSIS — I10 Essential (primary) hypertension: Secondary | ICD-10-CM | POA: Insufficient documentation

## 2015-07-09 DIAGNOSIS — Y998 Other external cause status: Secondary | ICD-10-CM | POA: Insufficient documentation

## 2015-07-09 DIAGNOSIS — Z853 Personal history of malignant neoplasm of breast: Secondary | ICD-10-CM | POA: Insufficient documentation

## 2015-07-09 DIAGNOSIS — Y9289 Other specified places as the place of occurrence of the external cause: Secondary | ICD-10-CM | POA: Insufficient documentation

## 2015-07-09 DIAGNOSIS — F319 Bipolar disorder, unspecified: Secondary | ICD-10-CM | POA: Insufficient documentation

## 2015-07-09 DIAGNOSIS — X58XXXA Exposure to other specified factors, initial encounter: Secondary | ICD-10-CM | POA: Insufficient documentation

## 2015-07-09 MED ORDER — HYDROXYZINE HCL 25 MG PO TABS: 25.0000 mg | ORAL_TABLET | Freq: Four times a day (QID) | ORAL | Status: DC

## 2015-07-09 MED ORDER — DEXAMETHASONE SODIUM PHOSPHATE 10 MG/ML IJ SOLN
10.0000 mg | Freq: Once | INTRAMUSCULAR | Status: AC
  Administered 2015-07-09: 10 mg via INTRAMUSCULAR
  Filled 2015-07-09: qty 1

## 2015-07-09 MED ORDER — FAMOTIDINE 20 MG PO TABS
20.0000 mg | ORAL_TABLET | Freq: Once | ORAL | Status: AC
  Administered 2015-07-09: 20 mg via ORAL
  Filled 2015-07-09: qty 1

## 2015-07-09 MED ORDER — PREDNISONE 20 MG PO TABS: 40.0000 mg | ORAL_TABLET | Freq: Every day | ORAL | Status: DC

## 2015-07-09 MED ORDER — DIPHENHYDRAMINE HCL 25 MG PO CAPS
50.0000 mg | ORAL_CAPSULE | Freq: Once | ORAL | Status: AC
Start: 2015-07-09 — End: 2015-07-09
  Administered 2015-07-09: 50 mg via ORAL
  Filled 2015-07-09: qty 2

## 2015-07-09 NOTE — ED Notes (Signed)
Patient states yesterday his left arm became pruritic and his face started flushing and turning red.  Patient states this morning he woke up and noticed his face was swollen and red around his mouth and nose and he was having difficulty swallowing.  On exam, lung sounds are clear to auscultation bilaterally, heart sounds, S1/S2, no murmur, rub or gallop.  +2 radial pulses bilaterally.  [name] oropharynx is red, but not exceptionally edematous.  Patient denies N/V, fever, cough and nasal congestion.  No dysphonia noted.  Patient is in no distress.

## 2015-07-09 NOTE — ED Notes (Signed)
Patient states he is supposed to take Atarax 25 mg 3x a day, but has been out of it.  Patient given Atarax Rx and ambulated to discharge.

## 2015-07-09 NOTE — ED Notes (Addendum)
Pt reports yesterday 12/20 at 2030 pt ate apples, grapes, and caramel. Went to bed, woke up at 2230 itching arms, legs, hair, groin, feet. Pt took benadryl last night with no relief. Pt applied benadryl cream with no relief. Pt then had "itching and burning". Pt then at bread, hot dog, and koolaid. Pt did not sleep last night. Today 12/21 around 0630 pt started having facial swelling. Pt reports it is difficult to swallow. Denies SOB or difficulty breathing. Pain from itching/ burning 5/10.   Pt reports he has not changed any lotions, creams, foods etc.   Hx of HTN, has not taken any medications today. Has not taken benadryl today, applied benadryl cream this morning.

## 2015-07-09 NOTE — ED Notes (Signed)
RN at bedside

## 2015-07-09 NOTE — ED Provider Notes (Signed)
CSN: [id number]     Arrival date & time 07/09/15  0906 History   First MD Initiated Contact with Patient 07/09/15 [phone]     Chief Complaint  Patient presents with  . Allergic Reaction     (Consider location/radiation/quality/duration/timing/severity/associated sxs/prior Treatment) HPI Comments: The patient is a 34 year old male, he has a history of seizures and hypertension, he also has a history of liver cirrhosis. He presents today with a complaint of itching and rash with some facial swelling. He reports that he has had allergic reactions in the past of penicillin but is not having any new medications at this time. The patient states that over the last 2 days he has had some increased itching, there was no rash but now he has developed some swelling of the face and states that he has a hard time swallowing though he has not tried to swallow anything today. Last night he was able to swallow a hotdog without any difficulties. He took some Benadryl last night, states it did not help that much. He has tried Benadryl cream without relief. He states that the itching involves his entire body, is diffuse and generalized. He denies any specific swelling of his tongue, lips, eyes area he denies any topical exposures, environmental exposures, medication exposures  Patient is a 34 y.o. male presenting with allergic reaction. The history is provided by the patient.  Allergic Reaction   Past Medical History  Diagnosis Date  . Seizures ([place])   . Hypertension   . Depression   . Pancreatitis   . Liver cirrhosis ([place])   . Coronary artery disease   . Angina   . Shortness of breath   . Headache(784.0)   . Peripheral vascular disease [facility]) April 2011    Left Pop  . Hypercholesteremia   . Schizophrenia ([place])   . Bipolar 1 disorder ([place])   . Cancer of kidney ([place]) dx'd 04/2013    lt nephrectomy  . Breast CA ([place]) dx'd 2009    bil w/ bil masectomy and oral meds  . H/O suicide attempt 2015    overdose    Past Surgical History  Procedure Laterality Date  . Chest surgery    . Left leg surgery    . Mastectomy    . Left kidney removal    . Breast surgery     Family History  Problem Relation Age of Onset  . Stroke Other   . Cancer Other   . Hyperlipidemia Mother   . Hypertension Mother    Social History  Substance Use Topics  . Smoking status: Current Every Day Smoker -- 0.25 packs/day for 10 years    Types: Cigarettes  . Smokeless tobacco: Never Used  . Alcohol Use: 0.0 oz/week    Review of Systems  All other systems reviewed and are negative.     Allergies  Codeine; Penicillins; Depakote; Morphine; Depakote er; Oxycodone; and Hydrocodone-acetaminophen  Home Medications   Prior to Admission medications   Medication Sig Start Date End Date Taking? Authorizing Provider  amLODipine (NORVASC) 5 MG tablet Take 1 tablet (5 mg total) by mouth daily. 04/16/15  Yes [name], NP  ARIPiprazole (ABILIFY) 10 MG tablet Take 1 tablet (10 mg total) by mouth daily. 04/16/15  Yes [name], NP  atorvastatin (LIPITOR) 20 MG tablet Take 25 mg by mouth every morning.   Yes Historical Provider, MD  diphenhydrAMINE (BENADRYL) 25 MG tablet Take 50 mg by mouth as needed for itching.   Yes  Historical Provider, MD  DULoxetine (CYMBALTA) 30 MG capsule Take 1 capsule (30 mg total) by mouth daily. 09/13/14  Yes [name], NP  gabapentin (NEURONTIN) 300 MG capsule Take 1 capsule (300 mg total) by mouth 3 (three) times daily. 04/16/15  Yes [name], NP  metoprolol tartrate (LOPRESSOR) 25 MG tablet Take 1 tablet (25 mg total) by mouth 2 (two) times daily. 04/16/15  Yes [name], NP  traZODone (DESYREL) 100 MG tablet Take 1 tablet (100 mg total) by mouth at bedtime. 04/16/15  Yes [name], NP  hydrOXYzine (ATARAX/VISTARIL) 25 MG tablet Take 1 tablet (25 mg total) by mouth every 6 (six) hours. 07/09/15   [name], MD  predniSONE (DELTASONE) 20 MG tablet Take 2 tablets (40 mg  total) by mouth daily. 07/09/15   [name], MD   BP 146/99 mmHg  Pulse 93  Temp(Src) 98.1 F (36.7 C) (Oral)  Resp 16  SpO2 100% Physical Exam  Constitutional: He appears well-developed and well-nourished. No distress.  HENT:  Head: Normocephalic and atraumatic.  Mouth/Throat: Oropharynx is clear and moist. No oropharyngeal exudate.  Oropharynx is clear and moist, soft palate visualized, uvula visualized, epiglottis visualized, everything appears to be normal size and caliber and in normal position, there is no asymmetry or angioedema present  Eyes: Conjunctivae and EOM are normal. Pupils are equal, round, and reactive to light. Right eye exhibits no discharge. Left eye exhibits no discharge. No scleral icterus.  Neck: Normal range of motion. Neck supple. No JVD present. No thyromegaly present.  Cardiovascular: Normal rate, regular rhythm, normal heart sounds and intact distal pulses.  Exam reveals no gallop and no friction rub.   No murmur heard. Pulmonary/Chest: Effort normal and breath sounds normal. No respiratory distress. He has no wheezes. He has no rales.  No wheezing, no increased work of breathing, normal phonation  Abdominal: Soft. Bowel sounds are normal. He exhibits no distension and no mass. There is no tenderness.  Musculoskeletal: Normal range of motion. He exhibits no edema or tenderness.  Lymphadenopathy:    He has no cervical adenopathy.  Neurological: He is alert. Coordination normal.  Skin: Skin is warm and dry. No rash noted. No erythema.  Psychiatric: He has a normal mood and affect. His behavior is normal.  Nursing note and vitals reviewed.   ED Course  Procedures (including critical care time) Labs Review Labs Reviewed - No data to display  Imaging Review No results found. I have personally reviewed and evaluated these images and lab results as part of my medical decision-making.    MDM   Final diagnoses:  Allergic reaction, initial encounter       The patient has no rash, he has normal speech, there does not appear to be any signs of swelling though he is diffusely itching. He is not jaundiced or icteric, we'll proceed with symptomatically control of his itching, he can follow up outpatient.  I do not see any signs of anaphylaxis, there is no swelling in the mouth throat or lungs, no signs of angioedema  No worsening of symptoms throughout stay - VS without acute concern, no signs of swelling or decomp.  Meds given in ED:  Medications  dexamethasone (DECADRON) injection 10 mg (10 mg Intramuscular Given 07/09/15 1014)  diphenhydrAMINE (BENADRYL) capsule 50 mg (50 mg Oral Given 07/09/15 1013)  famotidine (PEPCID) tablet 20 mg (20 mg Oral Given 07/09/15 1013)    New Prescriptions   HYDROXYZINE (ATARAX/VISTARIL) 25 MG  TABLET    Take 1 tablet (25 mg total) by mouth every 6 (six) hours.   PREDNISONE (DELTASONE) 20 MG TABLET    Take 2 tablets (40 mg total) by mouth daily.      [name], MD 07/09/15 [phone]

## 2015-07-11 ENCOUNTER — Other Ambulatory Visit: Payer: Self-pay

## 2015-07-11 ENCOUNTER — Encounter (HOSPITAL_COMMUNITY): Payer: Self-pay

## 2015-07-11 ENCOUNTER — Inpatient Hospital Stay (HOSPITAL_COMMUNITY)
Admission: EM | Admit: 2015-07-11 | Discharge: 2015-07-16 | DRG: 917 | Disposition: A | Discharge status: Other Acute Inpatient Hospital | Payer: No Typology Code available for payment source | Attending: Internal Medicine | Admitting: Internal Medicine

## 2015-07-11 ENCOUNTER — Emergency Department (HOSPITAL_COMMUNITY): Payer: No Typology Code available for payment source

## 2015-07-11 DIAGNOSIS — R251 Tremor, unspecified: Secondary | ICD-10-CM | POA: Diagnosis present

## 2015-07-11 DIAGNOSIS — F10129 Alcohol abuse with intoxication, unspecified: Secondary | ICD-10-CM | POA: Diagnosis present

## 2015-07-11 DIAGNOSIS — T50902A Poisoning by unspecified drugs, medicaments and biological substances, intentional self-harm, initial encounter: Secondary | ICD-10-CM | POA: Diagnosis present

## 2015-07-11 DIAGNOSIS — R7401 Elevation of levels of liver transaminase levels: Secondary | ICD-10-CM | POA: Diagnosis present

## 2015-07-11 DIAGNOSIS — Y92009 Unspecified place in unspecified non-institutional (private) residence as the place of occurrence of the external cause: Secondary | ICD-10-CM

## 2015-07-11 DIAGNOSIS — F17210 Nicotine dependence, cigarettes, uncomplicated: Secondary | ICD-10-CM | POA: Diagnosis present

## 2015-07-11 DIAGNOSIS — Z79899 Other long term (current) drug therapy: Secondary | ICD-10-CM

## 2015-07-11 DIAGNOSIS — R780 Finding of alcohol in blood: Secondary | ICD-10-CM

## 2015-07-11 DIAGNOSIS — F1410 Cocaine abuse, uncomplicated: Secondary | ICD-10-CM | POA: Diagnosis present

## 2015-07-11 DIAGNOSIS — Y908 Blood alcohol level of 240 mg/100 ml or more: Secondary | ICD-10-CM | POA: Diagnosis present

## 2015-07-11 DIAGNOSIS — F319 Bipolar disorder, unspecified: Secondary | ICD-10-CM | POA: Diagnosis present

## 2015-07-11 DIAGNOSIS — Z8249 Family history of ischemic heart disease and other diseases of the circulatory system: Secondary | ICD-10-CM

## 2015-07-11 DIAGNOSIS — T450X2A Poisoning by antiallergic and antiemetic drugs, intentional self-harm, initial encounter: Principal | ICD-10-CM | POA: Diagnosis present

## 2015-07-11 DIAGNOSIS — Z85528 Personal history of other malignant neoplasm of kidney: Secondary | ICD-10-CM

## 2015-07-11 DIAGNOSIS — I10 Essential (primary) hypertension: Secondary | ICD-10-CM | POA: Diagnosis present

## 2015-07-11 DIAGNOSIS — J9600 Acute respiratory failure, unspecified whether with hypoxia or hypercapnia: Secondary | ICD-10-CM | POA: Diagnosis present

## 2015-07-11 DIAGNOSIS — R4182 Altered mental status, unspecified: Secondary | ICD-10-CM

## 2015-07-11 DIAGNOSIS — T510X2A Toxic effect of ethanol, intentional self-harm, initial encounter: Secondary | ICD-10-CM | POA: Diagnosis present

## 2015-07-11 DIAGNOSIS — R740 Nonspecific elevation of levels of transaminase and lactic acid dehydrogenase [LDH]: Secondary | ICD-10-CM | POA: Diagnosis present

## 2015-07-11 LAB — BLOOD GAS, ARTERIAL
Acid-base deficit: 6.5 mmol/L — ABNORMAL HIGH (ref 0.0–2.0)
BICARBONATE: 17.9 meq/L — AB (ref 20.0–24.0)
DRAWN BY: 232811
FIO2: 1
MECHVT: 510 mL
O2 Saturation: 99.6 %
PATIENT TEMPERATURE: 36.3
PCO2 ART: 32.9 mmHg — AB (ref 35.0–45.0)
PEEP: 5 cmH2O
PO2 ART: 383 mmHg — AB (ref 80.0–100.0)
RATE: 14 resp/min
TCO2: 16 mmol/L (ref 0–100)
pH, Arterial: 7.35 (ref 7.350–7.450)

## 2015-07-11 LAB — ACETAMINOPHEN LEVEL

## 2015-07-11 LAB — DIFFERENTIAL
Basophils Absolute: 0 10*3/uL (ref 0.0–0.1)
Basophils Relative: 0 %
EOS ABS: 0 10*3/uL (ref 0.0–0.7)
EOS PCT: 0 %
LYMPHS ABS: 3.7 10*3/uL (ref 0.7–4.0)
Lymphocytes Relative: 53 %
MONO ABS: 0.3 10*3/uL (ref 0.1–1.0)
MONOS PCT: 4 %
NEUTROS PCT: 43 %
Neutro Abs: 3 10*3/uL (ref 1.7–7.7)

## 2015-07-11 LAB — ETHANOL: Alcohol, Ethyl (B): 447 mg/dL (ref <5)

## 2015-07-11 LAB — COMPREHENSIVE METABOLIC PANEL
ALBUMIN: 4.1 g/dL (ref 3.5–5.0)
ALK PHOS: 47 U/L (ref 38–126)
ALT: 87 U/L — AB (ref 17–63)
AST: 114 U/L — ABNORMAL HIGH (ref 15–41)
Anion gap: 12 (ref 5–15)
BUN: 11 mg/dL (ref 6–20)
CALCIUM: 8.9 mg/dL (ref 8.9–10.3)
CO2: 22 mmol/L (ref 22–32)
CREATININE: 0.91 mg/dL (ref 0.61–1.24)
Chloride: 109 mmol/L (ref 101–111)
GFR calc Af Amer: >60 mL/min (ref >60)
GFR calc non Af Amer: >60 mL/min (ref >60)
GLUCOSE: 87 mg/dL (ref 65–99)
Potassium: 3.8 mmol/L (ref 3.5–5.1)
SODIUM: 143 mmol/L (ref 135–145)
Total Bilirubin: 0.5 mg/dL (ref 0.3–1.2)
Total Protein: 7.5 g/dL (ref 6.5–8.1)

## 2015-07-11 LAB — CBC
HCT: 43.5 % (ref 39.0–52.0)
Hemoglobin: 14.7 g/dL (ref 13.0–17.0)
MCH: 30.9 pg (ref 26.0–34.0)
MCHC: 33.8 g/dL (ref 30.0–36.0)
MCV: 91.4 fL (ref 78.0–100.0)
PLATELETS: 167 10*3/uL (ref 150–400)
RBC: 4.76 MIL/uL (ref 4.22–5.81)
RDW: 15.1 % (ref 11.5–15.5)
WBC: 6.4 10*3/uL (ref 4.0–10.5)

## 2015-07-11 LAB — SALICYLATE LEVEL: Salicylate Lvl: <4 mg/dL (ref 2.8–30.0)

## 2015-07-11 MED ORDER — SUCCINYLCHOLINE CHLORIDE 20 MG/ML IJ SOLN
100.0000 mg | Freq: Once | INTRAMUSCULAR | Status: AC
  Administered 2015-07-11: 100 mg via INTRAVENOUS

## 2015-07-11 MED ORDER — PROPOFOL 1000 MG/100ML IV EMUL
15.0000 ug/kg/min | Freq: Once | INTRAVENOUS | Status: AC
  Administered 2015-07-11: 1000 mg via INTRAVENOUS

## 2015-07-11 MED ORDER — ETOMIDATE 2 MG/ML IV SOLN
20.0000 mg | Freq: Once | INTRAVENOUS | Status: AC
  Administered 2015-07-11: 20 mg via INTRAVENOUS

## 2015-07-11 MED ORDER — SODIUM CHLORIDE 0.9 % IV BOLUS (SEPSIS)
1000.0000 mL | Freq: Once | INTRAVENOUS | Status: AC
  Administered 2015-07-11: 1000 mL via INTRAVENOUS

## 2015-07-11 MED ORDER — NALOXONE HCL 0.4 MG/ML IJ SOLN
0.4000 mg | Freq: Once | INTRAMUSCULAR | Status: AC
  Administered 2015-07-11: 0.4 mg via INTRAVENOUS
  Filled 2015-07-11: qty 1

## 2015-07-11 MED ORDER — SODIUM CHLORIDE 0.9 % IV SOLN
Freq: Once | INTRAVENOUS | Status: AC
  Administered 2015-07-11: 23:00:00 via INTRAVENOUS

## 2015-07-11 MED ORDER — PROPOFOL 1000 MG/100ML IV EMUL
5.0000 ug/kg/min | INTRAVENOUS | Status: DC
  Administered 2015-07-12 (×3): 80 ug/kg/min via INTRAVENOUS
  Administered 2015-07-12: 75 ug/kg/min via INTRAVENOUS
  Administered 2015-07-12 – 2015-07-13 (×5): 80 ug/kg/min via INTRAVENOUS
  Filled 2015-07-11 (×11): qty 100

## 2015-07-11 MED ORDER — PROPOFOL 1000 MG/100ML IV EMUL
INTRAVENOUS | Status: AC
  Administered 2015-07-11: 1000 mg via INTRAVENOUS
  Filled 2015-07-11: qty 100

## 2015-07-11 MED ORDER — LORAZEPAM 2 MG/ML IJ SOLN
INTRAMUSCULAR | Status: AC
  Filled 2015-07-11: qty 1

## 2015-07-11 MED ORDER — LORAZEPAM 2 MG/ML IJ SOLN
2.0000 mg | Freq: Once | INTRAMUSCULAR | Status: AC
  Administered 2015-07-11: 2 mg via INTRAVENOUS

## 2015-07-11 NOTE — ED Notes (Signed)
Pt began waking up and fighting the ventilator.  Administered ativan and increased propofol drip rate per verbal orders from MD.

## 2015-07-11 NOTE — ED Provider Notes (Signed)
CSN: [id number]     Arrival date & time 07/11/15  2259 History   First MD Initiated Contact with Patient 07/11/15 2303     Chief Complaint  Patient presents with  . Ingestion     (Consider location/radiation/quality/duration/timing/severity/associated sxs/prior Treatment) Patient is a 34 y.o. male presenting with Ingested Medication. The history is provided by the EMS personnel. The history is limited by the condition of the patient (Unresponsive).  Ingestion  He was brought to emergency by EMS after reportedly being found by roommates with an empty bottle of Benadryl. He is estimated to have taken 100 Benadryl 25 mg tablets and drank 3 L of wine. He is unresponsive and unable to give any history.  Past Medical History  Diagnosis Date  . Seizures ([place])   . Hypertension   . Depression   . Pancreatitis   . Liver cirrhosis ([place])   . Coronary artery disease   . Angina   . Shortness of breath   . Headache(784.0)   . Peripheral vascular disease [facility]) April 2011    Left Pop  . Hypercholesteremia   . Schizophrenia ([place])   . Bipolar 1 disorder ([place])   . Cancer of kidney ([place]) dx'd 04/2013    lt nephrectomy  . Breast CA ([place]) dx'd 2009    bil w/ bil masectomy and oral meds  . H/O suicide attempt 2015    overdose   Past Surgical History  Procedure Laterality Date  . Chest surgery    . Left leg surgery    . Mastectomy    . Left kidney removal    . Breast surgery     Family History  Problem Relation Age of Onset  . Stroke Other   . Cancer Other   . Hyperlipidemia Mother   . Hypertension Mother    Social History  Substance Use Topics  . Smoking status: Current Every Day Smoker -- 0.25 packs/day for 10 years    Types: Cigarettes  . Smokeless tobacco: Never Used  . Alcohol Use: 0.0 oz/week    Review of Systems  Unable to perform ROS: Mental status change      Allergies  Codeine; Penicillins; Depakote; Morphine; Depakote er; Oxycodone; and  Hydrocodone-acetaminophen  Home Medications   Prior to Admission medications   Medication Sig Start Date End Date Taking? Authorizing Provider  amLODipine (NORVASC) 5 MG tablet Take 1 tablet (5 mg total) by mouth daily. 04/16/15   [name], NP  ARIPiprazole (ABILIFY) 10 MG tablet Take 1 tablet (10 mg total) by mouth daily. 04/16/15   [name], NP  atorvastatin (LIPITOR) 20 MG tablet Take 25 mg by mouth every morning.    Historical Provider, MD  diphenhydrAMINE (BENADRYL) 25 MG tablet Take 50 mg by mouth as needed for itching.    Historical Provider, MD  DULoxetine (CYMBALTA) 30 MG capsule Take 1 capsule (30 mg total) by mouth daily. 09/13/14   [name], NP  gabapentin (NEURONTIN) 300 MG capsule Take 1 capsule (300 mg total) by mouth 3 (three) times daily. 04/16/15   [name], NP  hydrOXYzine (ATARAX/VISTARIL) 25 MG tablet Take 1 tablet (25 mg total) by mouth every 6 (six) hours. 07/09/15   [name], MD  metoprolol tartrate (LOPRESSOR) 25 MG tablet Take 1 tablet (25 mg total) by mouth 2 (two) times daily. 04/16/15   [name], NP  predniSONE (DELTASONE) 20 MG tablet Take 2 tablets (40 mg total) by mouth daily. 07/09/15  [name], MD  traZODone (DESYREL) 100 MG tablet Take 1 tablet (100 mg total) by mouth at bedtime. 04/16/15   [name], NP   BP 159/134 mmHg  Pulse 113  Temp(Src) 96.8 F (36 C)  Resp 14  Ht 5\' 6"  (1.676 m)  Wt 155 lb (70.308 kg)  BMI 25.03 kg/m2  SpO2 100% Physical Exam  Nursing note and vitals reviewed.  34 year old male, resting comfortably and in no acute distress. Vital signs are significant for tachycardia and hypertension. Oxygen saturation is 100%, which is normal. Moderate odor of ethanol on his breath. Head is normocephalic and atraumatic. Pupils are 3 mm and minimally reactive Oropharynx is clear, but very poor gag reflex present. Neck is nontender and supple without adenopathy or JVD. Back is nontender and there is no CVA  tenderness. Lungs are clear without rales, wheezes, or rhonchi. Chest is nontender. Heart has regular rate and rhythm without murmur. Abdomen is soft, flat, nontender without masses or hepatosplenomegaly and peristalsis is normoactive. Extremities have no cyanosis or edema, full range of motion is present. Skin is warm and dry without rash. Neurologic: He is minimally responsive to deep painful stimuli but without any obvious focal weakness.  ED Course  Procedures (including critical care time)3 INTUBATION Performed by: [facility]  Required items: required blood products, implants, devices, and special equipment available Patient identity confirmed: provided demographic data and hospital-assigned identification number Time out: Immediately prior to procedure a "time out" was called to verify the correct patient, procedure, equipment, support staff and site/side marked as required.  Indications: Overdose with poor gag reflex, protect airway   Intubation method: Glidescope Laryngoscopy   Preoxygenation: BVM  Sedatives: Etomidate Paralytic: Succinylcholine  Tube Size: 7.5 cuffed  Post-procedure assessment: chest rise and ETCO2 monitor Breath sounds: equal and absent over the epigastrium Tube secured with: ETT holder Chest x-ray interpreted by radiologist and me.  Chest x-ray findings: endotracheal tube in appropriate position  Patient tolerated the procedure well with no immediate complications.    Labs Review Results for orders placed or performed during the hospital encounter of 07/11/15  Comprehensive metabolic panel  Result Value Ref Range   Sodium 143 135 - 145 mmol/L   Potassium 3.8 3.5 - 5.1 mmol/L   Chloride 109 101 - 111 mmol/L   CO2 22 22 - 32 mmol/L   Glucose, Bld 87 65 - 99 mg/dL   BUN 11 6 - 20 mg/dL   Creatinine, Ser 0.91 0.61 - 1.24 mg/dL   Calcium 8.9 8.9 - 10.3 mg/dL   Total Protein 7.5 6.5 - 8.1 g/dL   Albumin 4.1 3.5 - 5.0 g/dL   AST 114 (H) 15 -  41 U/L   ALT 87 (H) 17 - 63 U/L   Alkaline Phosphatase 47 38 - 126 U/L   Total Bilirubin 0.5 0.3 - 1.2 mg/dL   GFR calc non Af Amer >60 >60 mL/min   GFR calc Af Amer >60 >60 mL/min   Anion gap 12 5 - 15  Ethanol (ETOH)  Result Value Ref Range   Alcohol, Ethyl (B) 447 (HH) <5 mg/dL  Salicylate level  Result Value Ref Range   Salicylate Lvl 123456 2.8 - 30.0 mg/dL  Acetaminophen level  Result Value Ref Range   Acetaminophen (Tylenol), Serum <10 (L) 10 - 30 ug/mL  CBC  Result Value Ref Range   WBC 6.4 4.0 - 10.5 K/uL   RBC 4.76 4.22 - 5.81 MIL/uL   Hemoglobin 14.7 13.0 -  17.0 g/dL   HCT 43.5 39.0 - 52.0 %   MCV 91.4 78.0 - 100.0 fL   MCH 30.9 26.0 - 34.0 pg   MCHC 33.8 30.0 - 36.0 g/dL   RDW 15.1 11.5 - 15.5 %   Platelets 167 150 - 400 K/uL  Urine rapid drug screen (hosp performed) (Not at [facility])  Result Value Ref Range   Opiates NONE DETECTED NONE DETECTED   Cocaine POSITIVE (A) NONE DETECTED   Benzodiazepines NONE DETECTED NONE DETECTED   Amphetamines NONE DETECTED NONE DETECTED   Tetrahydrocannabinol NONE DETECTED NONE DETECTED   Barbiturates NONE DETECTED NONE DETECTED  Differential  Result Value Ref Range   Neutrophils Relative % 43 %   Neutro Abs 3.0 1.7 - 7.7 K/uL   Lymphocytes Relative 53 %   Lymphs Abs 3.7 0.7 - 4.0 K/uL   Monocytes Relative 4 %   Monocytes Absolute 0.3 0.1 - 1.0 K/uL   Eosinophils Relative 0 %   Eosinophils Absolute 0.0 0.0 - 0.7 K/uL   Basophils Relative 0 %   Basophils Absolute 0.0 0.0 - 0.1 K/uL  Blood gas, arterial  Result Value Ref Range   FIO2 1.00    Delivery systems VENTILATOR    Mode PRESSURE REGULATED VOLUME CONTROL    VT 510 mL   LHR 14 resp/min   Peep/cpap 5.0 cm H20   pH, Arterial 7.350 7.350 - 7.450   pCO2 arterial 32.9 (L) 35.0 - 45.0 mmHg   pO2, Arterial 383 (H) 80.0 - 100.0 mmHg   Bicarbonate 17.9 (L) 20.0 - 24.0 mEq/L   TCO2 16.0 0 - 100 mmol/L   Acid-base deficit 6.5 (H) 0.0 - 2.0 mmol/L   O2 Saturation 99.6 %    Patient temperature 36.3    Collection site RIGHT RADIAL    Drawn by [id number]    Sample type ARTERIAL    Allens test (pass/fail) PASS PASS   Imaging Review Dg Chest Port 1 View  07/11/2015  CLINICAL DATA:  34 year old male found on the floor status post intubation EXAM: PORTABLE CHEST 1 VIEW COMPARISON:  Chest CT dated 06/02/2015 FINDINGS: Endotracheal tube with tip approximately 3 cm above the carina. Single-view of the chest does not demonstrate any consolidation. No pleural effusion or pneumothorax. The cardiac silhouette is within normal limits. The osseous structures appear unremarkable. IMPRESSION: Endotracheal tube above the carina. Electronically Signed   By: Anner Crete M.D.   On: 07/11/2015 23:55   I have personally reviewed and evaluated these images and lab results as part of my medical decision-making.   EKG Interpretation   Date/Time:  Friday July 11 2015 22:57:55 EST Ventricular Rate:  110 PR Interval:  120 QRS Duration: 89 QT Interval:  371 QTC Calculation: 502 R Axis:   64 Text Interpretation:  Sinus tachycardia Abnormal R-wave progression, early  transition Nonspecific repol abnormality, diffuse leads Prolonged QT  interval When compared with ECG of 06/02/2015, QT has shortened  Nonspecific T wave abnormality has improved Confirmed by [facility]  MD, Toby Ayad  ([id number]) on 07/11/2015 11:26:31 PM      CRITICAL CARE Performed by: [id number] Total critical care time: 120 minutes Critical care time was exclusive of separately billable procedures and treating other patients. Critical care was necessary to treat or prevent imminent or life-threatening deterioration. Critical care was time spent personally by me on the following activities: development of treatment plan with patient and/or surrogate as well as nursing, discussions with consultants, evaluation of patient's response to treatment, examination of  patient, obtaining history from patient or surrogate, ordering  and performing treatments and interventions, ordering and review of laboratory studies, ordering and review of radiographic studies, pulse oximetry and re-evaluation of patient's condition.  MDM   Final diagnoses:  Drug overdose, intentional self-harm, initial encounter ([place])  Alcohol intoxication with blood level over 0.3  Acute respiratory failure, unspecified whether with hypoxia or hypercapnia (HCC)  Elevated transaminase level    Apparent drug overdose with diphenhydramine and ethanol. Because of poor gag reflex, decision was made to intubate him. He was also noted to this drop his oxygen saturations down to the high 80s. After intubation, he is placed on a diprovan drip for sedation.  He became agitated while on diprovan drip. Rate was increased and he was given lorazepam for sedation. He is noted to have a very high ethanol level and waxing and waning level of consciousness is consistent with alcohol intoxication. Case has been discussed with Dr. Ashok Cordia of critical care who agrees to admit the patient.      Delora Fuel, MD [phone]

## 2015-07-11 NOTE — ED Notes (Signed)
Notified MD of critical ETOH 447.

## 2015-07-11 NOTE — ED Notes (Signed)
Per JOYCE at [employer], pt is at risk for seizures.  If seizures do not respond to benzos, DO NOT treat with Dilantin due to risk of worsening seizure activity.  Administer fluids for tachycardia, watch for hypotension and Torsades. Check tylenol and aspirin levels as well as magnesium and potassium, replace electrolytes as needed.  If QRS complex on EKG >140, administer Sodium Bicarbonate BOLUS 1-14mEq/kg.  Notified MD of recommendations with verbal acknowledgement.

## 2015-07-11 NOTE — ED Notes (Signed)
Bed: [id number] Expected date:  Expected time:  Means of arrival:  Comments: overdose

## 2015-07-11 NOTE — ED Notes (Signed)
Pt's roommate called EMS when they found him on the floor, pt overdosed on 100 benedryl tablets and 3 liters of wine

## 2015-07-12 ENCOUNTER — Inpatient Hospital Stay (HOSPITAL_COMMUNITY): Payer: No Typology Code available for payment source

## 2015-07-12 DIAGNOSIS — T50902A Poisoning by unspecified drugs, medicaments and biological substances, intentional self-harm, initial encounter: Secondary | ICD-10-CM | POA: Diagnosis present

## 2015-07-12 DIAGNOSIS — T50902D Poisoning by unspecified drugs, medicaments and biological substances, intentional self-harm, subsequent encounter: Secondary | ICD-10-CM

## 2015-07-12 HISTORY — DX: Poisoning by unspecified drugs, medicaments and biological substances, intentional self-harm, initial encounter: T50.902A

## 2015-07-12 LAB — MAGNESIUM: Magnesium: 2.3 mg/dL (ref 1.7–2.4)

## 2015-07-12 LAB — RAPID URINE DRUG SCREEN, HOSP PERFORMED
AMPHETAMINES: NOT DETECTED
Barbiturates: NOT DETECTED
Benzodiazepines: NOT DETECTED
Cocaine: POSITIVE — AB
OPIATES: NOT DETECTED
TETRAHYDROCANNABINOL: NOT DETECTED

## 2015-07-12 LAB — TRIGLYCERIDES
Triglycerides: 105 mg/dL (ref <150)
Triglycerides: 145 mg/dL (ref <150)

## 2015-07-12 LAB — MRSA PCR SCREENING: MRSA BY PCR: NEGATIVE

## 2015-07-12 LAB — ACETAMINOPHEN LEVEL

## 2015-07-12 MED ORDER — AMLODIPINE BESYLATE 5 MG PO TABS
5.0000 mg | ORAL_TABLET | Freq: Every day | ORAL | Status: DC
  Administered 2015-07-13: 5 mg via NASOGASTRIC
  Filled 2015-07-12: qty 1

## 2015-07-12 MED ORDER — SODIUM CHLORIDE 0.9 % IV SOLN: INTRAVENOUS | Status: DC

## 2015-07-12 MED ORDER — MIDAZOLAM HCL 2 MG/2ML IJ SOLN
2.0000 mg | INTRAMUSCULAR | Status: DC | PRN
  Administered 2015-07-12: 2 mg via INTRAVENOUS
  Filled 2015-07-12: qty 2

## 2015-07-12 MED ORDER — FENTANYL CITRATE (PF) 100 MCG/2ML IJ SOLN
25.0000 ug | INTRAMUSCULAR | Status: DC | PRN
  Administered 2015-07-12 – 2015-07-13 (×12): 50 ug via INTRAVENOUS
  Filled 2015-07-12 (×12): qty 2

## 2015-07-12 MED ORDER — PANTOPRAZOLE SODIUM 40 MG IV SOLR
40.0000 mg | Freq: Every day | INTRAVENOUS | Status: DC
  Administered 2015-07-12 – 2015-07-13 (×2): 40 mg via INTRAVENOUS
  Filled 2015-07-12 (×2): qty 40

## 2015-07-12 MED ORDER — CHLORHEXIDINE GLUCONATE 0.12% ORAL RINSE (MEDLINE KIT)
15.0000 mL | Freq: Two times a day (BID) | OROMUCOSAL | Status: DC
Start: 2015-07-12 — End: 2015-07-14
  Administered 2015-07-12 – 2015-07-13 (×3): 15 mL via OROMUCOSAL

## 2015-07-12 MED ORDER — LABETALOL HCL 5 MG/ML IV SOLN
10.0000 mg | INTRAVENOUS | Status: DC | PRN
  Administered 2015-07-12 (×2): 10 mg via INTRAVENOUS
  Filled 2015-07-12 (×2): qty 4

## 2015-07-12 MED ORDER — HEPARIN SODIUM (PORCINE) 5000 UNIT/ML IJ SOLN
5000.0000 [IU] | Freq: Three times a day (TID) | INTRAMUSCULAR | Status: DC
  Administered 2015-07-12 – 2015-07-16 (×13): 5000 [IU] via SUBCUTANEOUS
  Filled 2015-07-12 (×15): qty 1

## 2015-07-12 MED ORDER — CHLORHEXIDINE GLUCONATE 0.12% ORAL RINSE (MEDLINE KIT)
15.0000 mL | Freq: Two times a day (BID) | OROMUCOSAL | Status: DC
  Administered 2015-07-12: 15 mL via OROMUCOSAL

## 2015-07-12 MED ORDER — ANTISEPTIC ORAL RINSE SOLUTION (CORINZ)
7.0000 mL | Freq: Four times a day (QID) | OROMUCOSAL | Status: DC
  Administered 2015-07-12: 7 mL via OROMUCOSAL

## 2015-07-12 MED ORDER — SODIUM CHLORIDE 0.9 % IV SOLN
Freq: Once | INTRAVENOUS | Status: AC
  Administered 2015-07-12: 01:00:00 via INTRAVENOUS

## 2015-07-12 MED ORDER — THIAMINE HCL 100 MG/ML IJ SOLN
500.0000 mg | INTRAVENOUS | Status: AC
  Administered 2015-07-12 – 2015-07-14 (×3): 500 mg via INTRAVENOUS
  Filled 2015-07-12 (×3): qty 5

## 2015-07-12 MED ORDER — SODIUM CHLORIDE 0.9 % IV SOLN
INTRAVENOUS | Status: DC
  Administered 2015-07-12 – 2015-07-14 (×3): via INTRAVENOUS

## 2015-07-12 MED ORDER — ANTISEPTIC ORAL RINSE SOLUTION (CORINZ)
7.0000 mL | Freq: Four times a day (QID) | OROMUCOSAL | Status: DC
  Administered 2015-07-12 – 2015-07-13 (×6): 7 mL via OROMUCOSAL

## 2015-07-12 MED ORDER — SODIUM CHLORIDE 0.9 % IV SOLN: 250.0000 mL | INTRAVENOUS | Status: DC | PRN

## 2015-07-12 NOTE — Progress Notes (Signed)
Initial Nutrition Assessment  DOCUMENTATION CODES:    None  INTERVENTION:  1.  Enteral nutrition; initiate Vital High Protein @ 10 mL/hr continuous. Do not advance.  Prostat  5 times daily to provide 740 kcal, 96g protein, 201 mL free water. TF + Propofol at current rate to provide 1613 kcal, 96 g protein, 201 mL free water.   RD to follow for adjustments to enteral regimen if warranted.   NUTRITION DIAGNOSIS:   Inadequate oral intake related to inability to eat as evidenced by NPO status.  GOAL:   Patient will meet greater than or equal to 90% of their needs  MONITOR:   Vent status, Diet advancement, Weight trends, TF tolerance  REASON FOR ASSESSMENT:   Ventilator    ASSESSMENT:   34 yo AAM with history of polysubstance abuse and prior admissions for this. The patient is sedated and intubated and unable to provide a history.  Patient is currently intubated on ventilator support MV: 6.8 L/min Temp (24hrs), Avg:97.6 F (36.4 C), Min:96.3 F (35.7 C), Max:98.8 F (37.1 C)  Propofol: 33.1 ml/hr providing 873 kcal/d  Nutrition Focused Physical Exam:  Subcutaneous Fat:  Orbital Region: WNL Upper Arm Region: WNL Thoracic and Lumbar Region: WNL  Muscle:  Temple Region: WNL Clavicle Bone Region: WNL Clavicle and Acromion Bone Region: WNL Scapular Bone Region: not assessed Dorsal Hand: not assessed, restraints Patellar Region: WNL Anterior Thigh Region: WNL Posterior Calf Region: WNL  Edema: none  Diet Order:  Diet NPO time specified  Skin:   intact  Last BM:  PTA  Height:   Ht Readings from Last 1 Encounters:  07/12/15 5\' 6"  (1.676 m)    Weight:   Wt Readings from Last 1 Encounters:  07/12/15 161 lb 2.5 oz (73.1 kg)    Ideal Body Weight:  64.5 kg  BMI:  Body mass index is 26.02 kg/(m^2).  Estimated Nutritional Needs:   Kcal:  1739  Protein:  75-88g  Fluid:  2L/day  EDUCATION NEEDS:   No education needs identified at this  time  Brynda Gre[place], MS RD LDN Clinical Inpatient Dietitian Weekend/After hours pager: [phone]

## 2015-07-12 NOTE — Progress Notes (Signed)
Patient admitted on ventilator and sedated. No family was present; admission paperwork could not be done.

## 2015-07-12 NOTE — ED Notes (Signed)
Pt is tolerating ventilator well and is exhibiting no changes in symptoms at this time.

## 2015-07-12 NOTE — H&P (Signed)
PULMONARY / CRITICAL CARE MEDICINE   Name: [name] MRN: [id number] DOB: 1980-09-29    ADMISSION DATE:  07/11/2015  CHIEF COMPLAINT:  Found down  HISTORY OF PRESENT ILLNESS:   34 yo AAM with history of polysubstance abuse and prior admissions for this.  The patient is sedated and intubated and unable to provide a history.  Reportedly from the ED: He was BIBA after being found by his roommates with and empty bottle of diphenhydramine (est. 100, 25mg  tablets as well as 3L of wine.  PAST MEDICAL HISTORY :   has a past medical history of Seizures ([place]); Hypertension; Depression; Pancreatitis; Liver cirrhosis ([place]); Coronary artery disease; Angina; Shortness of breath; Headache(784.0); Peripheral vascular disease [facility]) (April 2011); Hypercholesteremia; Schizophrenia ([place]); Bipolar 1 disorder ([place]); Cancer of kidney ([place]) (dx'd 04/2013); Breast CA ([place]) (dx'd 2009); and H/O suicide attempt (2015).  has past surgical history that includes Chest surgery; left leg surgery; Mastectomy; left kidney removal; and Breast surgery. Prior to Admission medications   Medication Sig Start Date End Date Taking? Authorizing Provider  amLODipine (NORVASC) 5 MG tablet Take 1 tablet (5 mg total) by mouth daily. 04/16/15  Yes [name], NP  ARIPiprazole (ABILIFY) 10 MG tablet Take 1 tablet (10 mg total) by mouth daily. 04/16/15  Yes [name], NP  atorvastatin (LIPITOR) 20 MG tablet Take 25 mg by mouth every morning.   Yes Historical Provider, MD  diphenhydrAMINE (BENADRYL) 25 MG tablet Take 50 mg by mouth as needed for itching.   Yes Historical Provider, MD  DULoxetine (CYMBALTA) 30 MG capsule Take 1 capsule (30 mg total) by mouth daily. 09/13/14  Yes [name], NP  gabapentin (NEURONTIN) 300 MG capsule Take 1 capsule (300 mg total) by mouth 3 (three) times daily. 04/16/15  Yes [name], NP  hydrOXYzine (ATARAX/VISTARIL) 25 MG tablet Take 1 tablet (25 mg total) by mouth every 6 (six) hours.  07/09/15  Yes [name], MD  metoprolol tartrate (LOPRESSOR) 25 MG tablet Take 1 tablet (25 mg total) by mouth 2 (two) times daily. 04/16/15  Yes [name], NP  predniSONE (DELTASONE) 20 MG tablet Take 2 tablets (40 mg total) by mouth daily. 07/09/15  Yes [name], MD  traZODone (DESYREL) 100 MG tablet Take 1 tablet (100 mg total) by mouth at bedtime. 04/16/15  Yes [name], NP   Allergies  Allergen Reactions  . Codeine Hives, Itching and Swelling  . Penicillins Swelling    Has patient had a PCN reaction causing immediate rash, facial/tongue/throat swelling, SOB or lightheadedness with hypotension: Yes Has patient had a PCN reaction causing severe rash involving mucus membranes or skin necrosis: Yes Has patient had a PCN reaction that required hospitalization Yes-ed visit Has patient had a PCN reaction occurring within the last 10 years: Yes If all of the above answers are "NO", then may proceed with Cephalosporin use.   . Depakote [Divalproex Sodium] Hives and Other (See Comments)    "Bug out and hallucinate"  . Morphine Itching  . Depakote Er [Divalproex Sodium Er] Hives  . Oxycodone Itching and Swelling  . Hydrocodone-Acetaminophen Itching and Rash    FAMILY HISTORY:  indicated that his mother is alive. He indicated that his father is deceased. He indicated that his brother is alive. He indicated that his other is alive.  SOCIAL HISTORY:  reports that he has been smoking Cigarettes.  He has a 2.5 pack-year smoking history. He has never used smokeless tobacco. He  reports that he drinks alcohol. He reports that he does not use illicit drugs.  REVIEW OF SYSTEMS:  Unable to obtain  SUBJECTIVE:   VITAL SIGNS: Temp:  [96.3 F (35.7 C)-98.4 F (36.9 C)] 96.7 F (35.9 C) (12/24 0317) Pulse Rate:  [111-144] 112 (12/24 0403) Resp:  [14-26] 17 (12/24 0403) BP: (143-167)/(119-136) 166/131 mmHg (12/24 0317) SpO2:  [87 %-100 %] 100 % (12/24 0403) FiO2 (%):  [40 %-100 %]  40 % (12/24 0403) Weight:  [70.308 kg (155 lb)-73.1 kg (161 lb 2.5 oz)] 73.1 kg (161 lb 2.5 oz) (12/24 0447) HEMODYNAMICS:   VENTILATOR SETTINGS: Vent Mode:  [-] PRVC FiO2 (%):  [40 %-100 %] 40 % Set Rate:  [14 bmp] 14 bmp Vt Set:  [510 mL] 510 mL PEEP:  [5 cmH20] 5 cmH20 Plateau Pressure:  [12 cmH20-14 cmH20] 14 cmH20 INTAKE / OUTPUT:  Intake/Output Summary (Last 24 hours) at 07/12/15 0510 Last data filed at 07/12/15 [id number]  Gross per 24 hour  Intake      0 ml  Output   1350 ml  Net  -1350 ml    PHYSICAL EXAMINATION: General:  Sedated, intubated Neuro:  Unable to obtain given propofol HEENT:  ETT in place, + b/l conjunctivitis, ecchymosis of the eyelids b/l Cardiovascular:  RRR, 2/6 holosystolic murmur at LSB 4th IC space Lungs:  CTA b/l no w/r/r.   + mechanical breath sounds Abdomen:  Soft, non tender.  Normal bowel sounds Musculoskeletal:  Normal bulk and tone Skin:  No obvious rashes.  LABS:  CBC  Recent Labs Lab 07/11/15 2305  WBC 6.4  HGB 14.7  HCT 43.5  PLT 167   Coag's No results for input(s): APTT, INR in the last 168 hours. BMET  Recent Labs Lab 07/11/15 2305  NA 143  K 3.8  CL 109  CO2 22  BUN 11  CREATININE 0.91  GLUCOSE 87   Electrolytes  Recent Labs Lab 07/11/15 2305 07/11/15 2306  CALCIUM 8.9  --   MG  --  2.3   Sepsis Markers No results for input(s): LATICACIDVEN, PROCALCITON, O2SATVEN in the last 168 hours. ABG  Recent Labs Lab 07/11/15 2345  PHART 7.350  PCO2ART 32.9*  PO2ART 383*   Liver Enzymes  Recent Labs Lab 07/11/15 2305  AST 114*  ALT 87*  ALKPHOS 47  BILITOT 0.5  ALBUMIN 4.1   Cardiac Enzymes No results for input(s): TROPONINI, PROBNP in the last 168 hours. Glucose No results for input(s): GLUCAP in the last 168 hours.  Imaging Ct Head Wo Contrast  07/12/2015  CLINICAL DATA:  Acute onset of medication overdose. Initial encounter. EXAM: CT HEAD WITHOUT CONTRAST TECHNIQUE: Contiguous axial images  were obtained from the base of the skull through the vertex without intravenous contrast. COMPARISON:  CT of the head and MRI of the brain performed 12/25/2014 FINDINGS: There is no evidence of acute infarction, mass lesion, or intra- or extra-axial hemorrhage on CT. The posterior fossa, including the cerebellum, brainstem and fourth ventricle, is within normal limits. The third and lateral ventricles, and basal ganglia are unremarkable in appearance. The cerebral hemispheres are symmetric in appearance, with normal gray-white differentiation. No mass effect or midline shift is seen. There is no evidence of fracture; visualized osseous structures are unremarkable in appearance. The orbits are within normal limits. The paranasal sinuses and mastoid air cells are well-aerated. No significant soft tissue abnormalities are seen. IMPRESSION: Unremarkable noncontrast CT of the head. Electronically Signed   By: Garald Balding  M.D.   On: 07/12/2015 03:59   Dg Chest Port 1 View  07/11/2015  CLINICAL DATA:  34 year old male found on the floor status post intubation EXAM: PORTABLE CHEST 1 VIEW COMPARISON:  Chest CT dated 06/02/2015 FINDINGS: Endotracheal tube with tip approximately 3 cm above the carina. Single-view of the chest does not demonstrate any consolidation. No pleural effusion or pneumothorax. The cardiac silhouette is within normal limits. The osseous structures appear unremarkable. IMPRESSION: Endotracheal tube above the carina. Electronically Signed   By: Anner Crete M.D.   On: 07/11/2015 23:55     ASSESSMENT / PLAN: 34yo male admitted with acute polysubstance intoxication including 1st generation antihistamine, cocaine and EtOH.  PULMONARY A: Intubated for airway protection due to overdose.  No overt signs of Crack lung on CXR. P:    - AC/VC TV 500, RR 12  - Check ABG  - wean sedation today to assess for SBT eligibility  - PPI, oral care  GASTROINTESTINAL A:  Mild transamonitis, likely  2/2 EtOH. P:    - APAP negative  - ASA negative  - Trend today  - If LFTs rising will calculate [place]'s DF to EtOH hepatitis   NEUROLOGIC A:  Polysubstance overdose including EtOH.  No other toxidromic evidence at this time.  Anti-cholinergic must be considered with 1st gen anti-histamines. P:   RASS goal: 0  - CIWA when propofol weaned  - Thiamine wernicke dosing  - supportive care for polysubstance  - no AGMA to suggest alternative ingestion   - will check Sosm   - Tube Feeding in AM - lovenox - full code/full tube   Total critical care time: 30 min  Critical care time was exclusive of separately billable procedures and treating other patients.  Critical care was necessary to treat or prevent imminent or life-threatening deterioration.  Critical care was time spent personally by me on the following activities: development of treatment plan with patient and/or surrogate as well as nursing, discussions with consultants, evaluation of patient's response to treatment, examination of patient, obtaining history from patient or surrogate, ordering and performing treatments and interventions, ordering and review of laboratory studies, ordering and review of radiographic studies, pulse oximetry and re-evaluation of patient's condition.   Meribeth Mattes, DO., MS [facility] [facility]    Pulmonary and [place] Pager: [phone]  07/12/2015, 5:10 AM

## 2015-07-12 NOTE — ED Notes (Signed)
Regulatory affairs officer. Patient going to 1239.

## 2015-07-13 DIAGNOSIS — T50902D Poisoning by unspecified drugs, medicaments and biological substances, intentional self-harm, subsequent encounter: Secondary | ICD-10-CM

## 2015-07-13 LAB — BASIC METABOLIC PANEL
ANION GAP: 9 (ref 5–15)
BUN: 11 mg/dL (ref 6–20)
CHLORIDE: 103 mmol/L (ref 101–111)
CO2: 26 mmol/L (ref 22–32)
CREATININE: 0.95 mg/dL (ref 0.61–1.24)
Calcium: 8.5 mg/dL — ABNORMAL LOW (ref 8.9–10.3)
GFR calc non Af Amer: >60 mL/min (ref >60)
GLUCOSE: 80 mg/dL (ref 65–99)
Potassium: 4.1 mmol/L (ref 3.5–5.1)
Sodium: 138 mmol/L (ref 135–145)

## 2015-07-13 LAB — CBC WITH DIFFERENTIAL/PLATELET
BASOS PCT: 0 %
Basophils Absolute: 0 10*3/uL (ref 0.0–0.1)
EOS ABS: 0.1 10*3/uL (ref 0.0–0.7)
EOS PCT: 2 %
HEMATOCRIT: 39.6 % (ref 39.0–52.0)
HEMOGLOBIN: 13.2 g/dL (ref 13.0–17.0)
LYMPHS ABS: 1.1 10*3/uL (ref 0.7–4.0)
Lymphocytes Relative: 31 %
MCH: 30.4 pg (ref 26.0–34.0)
MCHC: 33.3 g/dL (ref 30.0–36.0)
MCV: 91.2 fL (ref 78.0–100.0)
MONO ABS: 0.4 10*3/uL (ref 0.1–1.0)
Monocytes Relative: 9 %
Neutro Abs: 2.2 10*3/uL (ref 1.7–7.7)
Neutrophils Relative %: 58 %
Platelets: 111 10*3/uL — ABNORMAL LOW (ref 150–400)
RBC: 4.34 MIL/uL (ref 4.22–5.81)
RDW: 15 % (ref 11.5–15.5)
WBC: 3.7 10*3/uL — AB (ref 4.0–10.5)

## 2015-07-13 LAB — BLOOD GAS, ARTERIAL
ACID-BASE EXCESS: 0.1 mmol/L (ref 0.0–2.0)
BICARBONATE: 24.7 meq/L — AB (ref 20.0–24.0)
Drawn by: 441381
FIO2: 40
LHR: 14 {breaths}/min
O2 SAT: 99 %
PATIENT TEMPERATURE: 100
PCO2 ART: 43.6 mmHg (ref 35.0–45.0)
PEEP/CPAP: 5 cmH2O
PH ART: 7.376 (ref 7.350–7.450)
PO2 ART: 156 mmHg — AB (ref 80.0–100.0)
TCO2: 21.9 mmol/L (ref 0–100)
VT: 510 mL

## 2015-07-13 LAB — HEPATIC FUNCTION PANEL
ALT: 72 U/L — ABNORMAL HIGH (ref 17–63)
AST: 65 U/L — ABNORMAL HIGH (ref 15–41)
Albumin: 3.6 g/dL (ref 3.5–5.0)
Alkaline Phosphatase: 49 U/L (ref 38–126)
BILIRUBIN DIRECT: 0.2 mg/dL (ref 0.1–0.5)
BILIRUBIN INDIRECT: 0.6 mg/dL (ref 0.3–0.9)
BILIRUBIN TOTAL: 0.8 mg/dL (ref 0.3–1.2)
Total Protein: 6.5 g/dL (ref 6.5–8.1)

## 2015-07-13 LAB — MAGNESIUM: MAGNESIUM: 1.4 mg/dL — AB (ref 1.7–2.4)

## 2015-07-13 LAB — TRIGLYCERIDES: TRIGLYCERIDES: 336 mg/dL — AB (ref <150)

## 2015-07-13 LAB — PHOSPHORUS: Phosphorus: 4.3 mg/dL (ref 2.5–4.6)

## 2015-07-13 MED ORDER — LORAZEPAM 2 MG/ML IJ SOLN
2.0000 mg | INTRAMUSCULAR | Status: DC | PRN
  Administered 2015-07-13 – 2015-07-14 (×4): 2 mg via INTRAVENOUS
  Filled 2015-07-13 (×4): qty 1

## 2015-07-13 MED ORDER — INFLUENZA VAC SPLIT QUAD 0.5 ML IM SUSY
0.5000 mL | PREFILLED_SYRINGE | INTRAMUSCULAR | Status: AC
  Administered 2015-07-14: 0.5 mL via INTRAMUSCULAR
  Filled 2015-07-13 (×2): qty 0.5

## 2015-07-13 MED ORDER — PNEUMOCOCCAL VAC POLYVALENT 25 MCG/0.5ML IJ INJ
0.5000 mL | INJECTION | INTRAMUSCULAR | Status: AC
  Administered 2015-07-14: 0.5 mL via INTRAMUSCULAR
  Filled 2015-07-13 (×2): qty 0.5

## 2015-07-13 MED ORDER — FOLIC ACID 1 MG PO TABS
1.0000 mg | ORAL_TABLET | Freq: Every day | ORAL | Status: DC
  Administered 2015-07-13 – 2015-07-16 (×4): 1 mg via ORAL
  Filled 2015-07-13 (×4): qty 1

## 2015-07-13 MED ORDER — TRAZODONE HCL 50 MG PO TABS
100.0000 mg | ORAL_TABLET | Freq: Every evening | ORAL | Status: DC | PRN
  Administered 2015-07-13 – 2015-07-15 (×3): 100 mg via ORAL
  Filled 2015-07-13 (×4): qty 2

## 2015-07-13 NOTE — Plan of Care (Signed)
Problem: Phase II Progression Outcomes Goal: Date pt extubated/weaned off vent Outcome: Completed/Met Date Met:  07/13/15 07-13-2015

## 2015-07-13 NOTE — Procedures (Signed)
Extubation Procedure Note  Patient Details:   Name: [name] DOB: 11/05/1980 MRN: [id number]   Airway Documentation:     Evaluation  O2 sats: stable throughout Complications: No apparent complications Patient did tolerate procedure well. Bilateral Breath Sounds: Clear Suctioning: Airway Yes  Lamonte Sakai 07/13/2015, 9:14 AM

## 2015-07-13 NOTE — Progress Notes (Signed)
[place] Progress Note Patient Name: [name] DOB: 02-25-1981 MRN: [id number]   Date of Service  07/13/2015  HPI/Events of Note  Patient requests his home dose of trazodone for sleep  eICU Interventions  ordered     Intervention Category Minor Interventions: Routine modifications to care plan (e.g. PRN medications for pain, fever)  Simonne Maffucci 07/13/2015, 10:23 PM

## 2015-07-13 NOTE — Progress Notes (Signed)
PULMONARY / CRITICAL CARE MEDICINE   Name: [name] MRN: [id number] DOB: 05/15/1981    ADMISSION DATE:  07/11/2015  CHIEF COMPLAINT:  Found down  HISTORY OF PRESENT ILLNESS:   34 yo AAM with history of polysubstance abuse and prior admissions for this.  The patient is sedated and intubated and unable to provide a history.  Reportedly from the ED: He was BIBA after being found by his roommates with and empty bottle of diphenhydramine (est. 100, 25mg  tablets as well as 3L of wine.  PAST MEDICAL HISTORY :   has a past medical history of Seizures ([name]); Hypertension; Depression; Pancreatitis; Liver cirrhosis ([name]); Coronary artery disease; Angina; Shortness of breath; Headache(784.0); Peripheral vascular disease [name]; Hypercholesteremia; Schizophrenia ([name]); Bipolar 1 disorder ([name]); Cancer of kidney ([name]) (dx'd 04/2013); Breast CA ([name]) (dx'd 2009); and H/O suicide attempt (2015).  has past surgical history that includes Chest surgery; left leg surgery; Mastectomy; left kidney removal; and Breast surgery. Prior to Admission medications   Medication Sig Start Date End Date Taking? Authorizing Provider  amLODipine (NORVASC) 5 MG tablet Take 1 tablet (5 mg total) by mouth daily. 04/16/15  Yes [name], NP  ARIPiprazole (ABILIFY) 10 MG tablet Take 1 tablet (10 mg total) by mouth daily. 04/16/15  Yes [name], NP  atorvastatin (LIPITOR) 20 MG tablet Take 25 mg by mouth every morning.   Yes Historical Provider, MD  diphenhydrAMINE (BENADRYL) 25 MG tablet Take 50 mg by mouth as needed for itching.   Yes Historical Provider, MD  DULoxetine (CYMBALTA) 30 MG capsule Take 1 capsule (30 mg total) by mouth daily. 09/13/14  Yes [name], NP  gabapentin (NEURONTIN) 300 MG capsule Take 1 capsule (300 mg total) by mouth 3 (three) times daily. 04/16/15  Yes [name], NP  hydrOXYzine (ATARAX/VISTARIL) 25 MG tablet Take 1 tablet (25 mg total) by mouth every 6 (six) hours.  07/09/15  Yes [name], MD  metoprolol tartrate (LOPRESSOR) 25 MG tablet Take 1 tablet (25 mg total) by mouth 2 (two) times daily. 04/16/15  Yes [name], NP  predniSONE (DELTASONE) 20 MG tablet Take 2 tablets (40 mg total) by mouth daily. 07/09/15  Yes [name], MD  traZODone (DESYREL) 100 MG tablet Take 1 tablet (100 mg total) by mouth at bedtime. 04/16/15  Yes [name], NP   Allergies  Allergen Reactions  . Codeine Hives, Itching and Swelling  . Penicillins Swelling    Has patient had a PCN reaction causing immediate rash, facial/tongue/throat swelling, SOB or lightheadedness with hypotension: Yes Has patient had a PCN reaction causing severe rash involving mucus membranes or skin necrosis: Yes Has patient had a PCN reaction that required hospitalization Yes-ed visit Has patient had a PCN reaction occurring within the last 10 years: Yes If all of the above answers are "NO", then may proceed with Cephalosporin use.   . Depakote [Divalproex Sodium] Hives and Other (See Comments)    "Bug out and hallucinate"  . Morphine Itching  . Depakote Er [Divalproex Sodium Er] Hives  . Oxycodone Itching and Swelling  . Hydrocodone-Acetaminophen Itching and Rash    FAMILY HISTORY:  indicated that his mother is alive. He indicated that his father is deceased. He indicated that his brother is alive. He indicated that his other is alive.  SOCIAL HISTORY:  reports that he has been smoking Cigarettes.  He has a 2.5 pack-year smoking history. He has never used smokeless tobacco. He  reports that he drinks alcohol. He reports that he does not use illicit drugs.  REVIEW OF SYSTEMS:  Unable to obtain  SUBJECTIVE: Awake and doing well on weaning trial.  VITAL SIGNS: Temp:  [98.8 F (37.1 C)-100.6 F (38.1 C)] 100.2 F (37.9 C) (12/25 0730) Pulse Rate:  [96-125] 113 (12/25 0757) Resp:  [13-21] 16 (12/25 0757) BP: (129-171)/(86-130) 153/112 mmHg (12/25 0757) SpO2:  [95 %-100 %] 100 %  (12/25 0757) FiO2 (%):  [30 %-40 %] 30 % (12/25 0757) Weight:  [162 lb 7.7 oz (73.7 kg)] 162 lb 7.7 oz (73.7 kg) (12/25 0426) HEMODYNAMICS:   VENTILATOR SETTINGS: Vent Mode:  [-] PSV;CPAP FiO2 (%):  [30 %-40 %] 30 % Set Rate:  [14 bmp] 14 bmp Vt Set:  [510 mL] 510 mL PEEP:  [5 cmH20] 5 cmH20 Pressure Support:  [5 cmH20] 5 cmH20 Plateau Pressure:  [13 cmH20-16 cmH20] 14 cmH20 INTAKE / OUTPUT:  Intake/Output Summary (Last 24 hours) at 07/13/15 0840 Last data filed at 07/13/15 0700  Gross per 24 hour  Intake 2266.29 ml  Output   1340 ml  Net 926.29 ml    PHYSICAL EXAMINATION: General:  Awake, gagging on tube. Neuro:  PERRL, moving all extremities HEENT:  Moist mucus membranes Cardiovascular:  S1, S2, Lungs:  Clear, no wheeze or crackles. Abdomen:  Soft, non tender.  Normal bowel sounds Musculoskeletal:  Normal bulk and tone Skin:  No obvious rashes.  LABS:  CBC  Recent Labs Lab 07/11/15 2305 07/13/15 0450  WBC 6.4 3.7*  HGB 14.7 13.2  HCT 43.5 39.6  PLT 167 111*   Coag's No results for input(s): APTT, INR in the last 168 hours. BMET  Recent Labs Lab 07/11/15 2305 07/13/15 0450  NA 143 138  K 3.8 4.1  CL 109 103  CO2 22 26  BUN 11 11  CREATININE 0.91 0.95  GLUCOSE 87 80   Electrolytes  Recent Labs Lab 07/11/15 2305 07/11/15 2306 07/13/15 0450  CALCIUM 8.9  --  8.5*  MG  --  2.3 1.4*  PHOS  --   --  4.3   Sepsis Markers No results for input(s): LATICACIDVEN, PROCALCITON, O2SATVEN in the last 168 hours. ABG  Recent Labs Lab 07/11/15 2345 07/13/15 0443  PHART 7.350 7.376  PCO2ART 32.9* 43.6  PO2ART 383* 156*   Liver Enzymes  Recent Labs Lab 07/11/15 2305  AST 114*  ALT 87*  ALKPHOS 47  BILITOT 0.5  ALBUMIN 4.1   Cardiac Enzymes No results for input(s): TROPONINI, PROBNP in the last 168 hours. Glucose No results for input(s): GLUCAP in the last 168 hours.  Imaging No results found.   ASSESSMENT / PLAN: 34yo male  admitted with acute polysubstance intoxication including 1st generation antihistamine, cocaine and EtOH.  PULMONARY A: Intubated for airway protection due to overdose.  No overt signs of Crack lung on CXR. P:   Extubate today. Wean to [place]  GASTROINTESTINAL A:  Mild transamonitis, likely 2/2 EtOH. P:   Repeat LFTs   NEUROLOGIC A:  Polysubstance overdose including EtOH.  No other toxidromic evidence at this time.  Anti-cholinergic must be considered with 1st gen anti-histamines. P:   CIWA when extubated Will need sitter. Psych consult when able to communicate.   Total critical care time: 30 min  Marshell Garfinkel MD [place] [facility] Pager [phone] If no answer or after 3pm call: [phone] 07/13/2015, 8:41 AM

## 2015-07-14 DIAGNOSIS — R740 Nonspecific elevation of levels of transaminase and lactic acid dehydrogenase [LDH]: Secondary | ICD-10-CM

## 2015-07-14 DIAGNOSIS — R780 Finding of alcohol in blood: Secondary | ICD-10-CM

## 2015-07-14 DIAGNOSIS — R7401 Elevation of levels of liver transaminase levels: Secondary | ICD-10-CM | POA: Diagnosis present

## 2015-07-14 DIAGNOSIS — T50902A Poisoning by unspecified drugs, medicaments and biological substances, intentional self-harm, initial encounter: Secondary | ICD-10-CM

## 2015-07-14 LAB — TRIGLYCERIDES: Triglycerides: 158 mg/dL — ABNORMAL HIGH (ref <150)

## 2015-07-14 LAB — CBC WITH DIFFERENTIAL/PLATELET
BASOS PCT: 1 %
Basophils Absolute: 0 10*3/uL (ref 0.0–0.1)
EOS ABS: 0.2 10*3/uL (ref 0.0–0.7)
Eosinophils Relative: 5 %
HCT: 39.7 % (ref 39.0–52.0)
HEMOGLOBIN: 13.8 g/dL (ref 13.0–17.0)
Lymphocytes Relative: 37 %
Lymphs Abs: 1.5 10*3/uL (ref 0.7–4.0)
MCH: 30.7 pg (ref 26.0–34.0)
MCHC: 34.8 g/dL (ref 30.0–36.0)
MCV: 88.4 fL (ref 78.0–100.0)
MONO ABS: 0.4 10*3/uL (ref 0.1–1.0)
MONOS PCT: 11 %
NEUTROS PCT: 46 %
Neutro Abs: 1.8 10*3/uL (ref 1.7–7.7)
PLATELETS: 125 10*3/uL — AB (ref 150–400)
RBC: 4.49 MIL/uL (ref 4.22–5.81)
RDW: 14.1 % (ref 11.5–15.5)
WBC: 3.9 10*3/uL — ABNORMAL LOW (ref 4.0–10.5)

## 2015-07-14 LAB — MAGNESIUM: MAGNESIUM: 1.8 mg/dL (ref 1.7–2.4)

## 2015-07-14 LAB — BASIC METABOLIC PANEL
Anion gap: 10 (ref 5–15)
BUN: 7 mg/dL (ref 6–20)
CALCIUM: 8.8 mg/dL — AB (ref 8.9–10.3)
CO2: 25 mmol/L (ref 22–32)
CREATININE: 0.74 mg/dL (ref 0.61–1.24)
Chloride: 100 mmol/L — ABNORMAL LOW (ref 101–111)
GFR calc non Af Amer: >60 mL/min (ref >60)
Glucose, Bld: 129 mg/dL — ABNORMAL HIGH (ref 65–99)
Potassium: 3.6 mmol/L (ref 3.5–5.1)
SODIUM: 135 mmol/L (ref 135–145)

## 2015-07-14 LAB — PHOSPHORUS: PHOSPHORUS: 3.2 mg/dL (ref 2.5–4.6)

## 2015-07-14 MED ORDER — METOPROLOL TARTRATE 25 MG PO TABS
25.0000 mg | ORAL_TABLET | Freq: Two times a day (BID) | ORAL | Status: DC
  Administered 2015-07-14 – 2015-07-16 (×5): 25 mg via ORAL
  Filled 2015-07-14 (×7): qty 1

## 2015-07-14 MED ORDER — AMLODIPINE BESYLATE 5 MG PO TABS
5.0000 mg | ORAL_TABLET | Freq: Every day | ORAL | Status: DC
  Administered 2015-07-14 – 2015-07-16 (×3): 5 mg via ORAL
  Filled 2015-07-14 (×3): qty 1

## 2015-07-14 MED ORDER — DEXTROSE-NACL 5-0.45 % IV SOLN
INTRAVENOUS | Status: DC
  Administered 2015-07-14: 50 mL/h via INTRAVENOUS
  Administered 2015-07-15: 06:00:00 via INTRAVENOUS

## 2015-07-14 NOTE — Progress Notes (Signed)
PULMONARY / CRITICAL CARE MEDICINE   Name: [name] MRN: [id number] DOB: 1980-09-16    ADMISSION DATE:  07/11/2015  CHIEF COMPLAINT:  Found down  HISTORY OF PRESENT ILLNESS:   34 yo AAM with history of polysubstance abuse and prior admissions for this.  The patient is sedated and intubated and unable to provide a history.  SUBJECTIVE:  No distress   VITAL SIGNS: Temp:  [98.3 F (36.8 C)-99.2 F (37.3 C)] 99.2 F (37.3 C) (12/26 0813) Pulse Rate:  [94-133] 102 (12/26 0800) Resp:  [12-37] 30 (12/26 0800) BP: (113-166)/(71-113) 148/97 mmHg (12/26 0800) SpO2:  [94 %-100 %] 97 % (12/26 0800) Weight:  [67.3 kg (148 lb 5.9 oz)-73 kg (160 lb 15 oz)] 67.3 kg (148 lb 5.9 oz) (12/26 0500) Room air     INTAKE / OUTPUT:  Intake/Output Summary (Last 24 hours) at 07/14/15 0947 Last data filed at 07/14/15 0900  Gross per 24 hour  Intake   2570 ml  Output   3600 ml  Net  -1030 ml    PHYSICAL EXAMINATION: General:  Awake, no distress  Neuro:  PERRL, moving all extremities HEENT:  Moist mucus membranes Cardiovascular:  S1, S2, Lungs:  Clear, no wheeze or crackles. Abdomen:  Soft, non tender.  Normal bowel sounds Musculoskeletal:  Normal bulk and tone Skin:  No obvious rashes.  LABS:  CBC  Recent Labs Lab 07/11/15 2305 07/13/15 0450 07/14/15 0356  WBC 6.4 3.7* 3.9*  HGB 14.7 13.2 13.8  HCT 43.5 39.6 39.7  PLT 167 111* 125*   Coag's No results for input(s): APTT, INR in the last 168 hours. BMET  Recent Labs Lab 07/11/15 2305 07/13/15 0450 07/14/15 0356  NA 143 138 135  K 3.8 4.1 3.6  CL 109 103 100*  CO2 22 26 25   BUN 11 11 7   CREATININE 0.91 0.95 0.74  GLUCOSE 87 80 129*   Electrolytes  Recent Labs Lab 07/11/15 2305 07/11/15 2306 07/13/15 0450 07/14/15 0356  CALCIUM 8.9  --  8.5* 8.8*  MG  --  2.3 1.4* 1.8  PHOS  --   --  4.3 3.2   Sepsis Markers No results for input(s): LATICACIDVEN, PROCALCITON, O2SATVEN in the last 168  hours. ABG  Recent Labs Lab 07/11/15 2345 07/13/15 0443  PHART 7.350 7.376  PCO2ART 32.9* 43.6  PO2ART 383* 156*   Liver Enzymes  Recent Labs Lab 07/11/15 2305 07/13/15 0450  AST 114* 65*  ALT 87* 72*  ALKPHOS 47 49  BILITOT 0.5 0.8  ALBUMIN 4.1 3.6   Cardiac Enzymes No results for input(s): TROPONINI, PROBNP in the last 168 hours. Glucose No results for input(s): GLUCAP in the last 168 hours.  Imaging No results found.   ASSESSMENT / PLAN: 34yo male admitted with acute polysubstance intoxication including 1st generation antihistamine, cocaine and EtOH.   Polysubstance intentional overdose including EtOH.   -as of 12/26 hemodynamically stable; larger issue is concern w/ ETOH w/d. Has had seizure in past; drinks daily. Does report he was trying to harm himself Plan:   CIWA protocol Sitter at bedside Repeat Qtc is < 0.5 Cont thiamine and folic acid Will defer abilify and cymbalta to psych  Will ask psych to see today  HTN [place] to resume Metoprolol at this point   Mild transamonitis, likely 2/2 EtOH-->improved Plan:   Repeat LFTs PRN  Will ask triad to assume care Transfer to [place] ACNP-BC [place] Pager #  [phone] OR # [phone] if no answer   07/14/2015, 9:47 AM

## 2015-07-14 NOTE — Progress Notes (Signed)
Nutrition Follow-up  DOCUMENTATION CODES:   Not applicable  INTERVENTION:  - Encourage PO intakes of meals - RD will continue to monitor for needs  NUTRITION DIAGNOSIS:   Unintentional weight loss related to acute illness as evidenced by percent weight loss. NEW  GOAL:   Patient will meet greater than or equal to 90% of their needs -beginning to meet  MONITOR:   PO intake, Weight trends, Labs, I & O's  REASON FOR ASSESSMENT:   Consult Enteral/tube feeding initiation and management  ASSESSMENT:   34 yo AAM with history of polysubstance abuse and prior admissions for this. The patient is sedated and intubated and unable to provide a history.  Pt previously intubated and order placed for RD to initiate and manage TF. Pt was subsequently extubated yesterday (12/25) AM and diet advanced to Regular at that time. Per chart review, pt ate 40% of lunch and 80% of dinner yesterday. He states that for breakfast this AM he had several bites each of eggs, sausage, [place] toast, and yogurt and drank orange juice and apple juice. Pt states that he has been experiencing sore throat since extubation and has, therefore, been eating more slowly than usual.   He states good appetite PTA and denies recent weight fluctuations. Per chart review, pt has lost 7 lbs (4.5% body weight) in the past 1 month which is not significant for time frame. Beginning to meet nutrition needs at this time; needs adjusted s/p extubation. Medications reviewed. Labs reviewed; Cl: 100 mmol/L, AST/ALT elevated.   Diet Order:  Diet regular Room service appropriate?: Yes; Fluid consistency:: Thin  Skin:  Reviewed, no issues  Last BM:  12/24  Height:   Ht Readings from Last 1 Encounters:  07/12/15 5\' 6"  (1.676 m)    Weight:   Wt Readings from Last 1 Encounters:  07/14/15 148 lb 5.9 oz (67.3 kg)    Ideal Body Weight:  64.5 kg  BMI:  Body mass index is 23.96 kg/(m^2).  Estimated Nutritional Needs:   Kcal:   1700-1900  Protein:  65-75 grams  Fluid:  2 L/day  EDUCATION NEEDS:   No education needs identified at this time     Jarome Matin, RD, LDN Inpatient Clinical Dietitian Pager # [phone] After hours/weekend pager # [phone]

## 2015-07-14 NOTE — Progress Notes (Signed)
Utilization review completed.  

## 2015-07-14 NOTE — Plan of Care (Signed)
Problem: Tissue Perfusion: Goal: Risk factors for ineffective tissue perfusion will decrease Outcome: Progressing Risks for VTE have been assessed.  Patient is on subcutaneous Heparin injections as a prophylactic measure.

## 2015-07-14 NOTE — Progress Notes (Signed)
Handoff report called to [place], Therapist, sports.  Patient will be transferring to room 1510 via wheelchair.  Trained sitter at the patient's bedside.

## 2015-07-15 DIAGNOSIS — T450X2A Poisoning by antiallergic and antiemetic drugs, intentional self-harm, initial encounter: Secondary | ICD-10-CM | POA: Diagnosis not present

## 2015-07-15 DIAGNOSIS — T1491 Suicide attempt: Secondary | ICD-10-CM

## 2015-07-15 LAB — CBC WITH DIFFERENTIAL/PLATELET
BASOS ABS: 0 10*3/uL (ref 0.0–0.1)
Basophils Relative: 1 %
EOS PCT: 7 %
Eosinophils Absolute: 0.3 10*3/uL (ref 0.0–0.7)
HCT: 40.8 % (ref 39.0–52.0)
Hemoglobin: 14 g/dL (ref 13.0–17.0)
LYMPHS ABS: 1.4 10*3/uL (ref 0.7–4.0)
LYMPHS PCT: 37 %
MCH: 30.3 pg (ref 26.0–34.0)
MCHC: 34.3 g/dL (ref 30.0–36.0)
MCV: 88.3 fL (ref 78.0–100.0)
MONO ABS: 0.5 10*3/uL (ref 0.1–1.0)
MONOS PCT: 14 %
Neutro Abs: 1.7 10*3/uL (ref 1.7–7.7)
Neutrophils Relative %: 43 %
PLATELETS: 147 10*3/uL — AB (ref 150–400)
RBC: 4.62 MIL/uL (ref 4.22–5.81)
RDW: 14.2 % (ref 11.5–15.5)
WBC: 3.9 10*3/uL — ABNORMAL LOW (ref 4.0–10.5)

## 2015-07-15 LAB — BASIC METABOLIC PANEL
Anion gap: 6 (ref 5–15)
BUN: 9 mg/dL (ref 6–20)
CALCIUM: 9 mg/dL (ref 8.9–10.3)
CO2: 26 mmol/L (ref 22–32)
Chloride: 103 mmol/L (ref 101–111)
Creatinine, Ser: 0.82 mg/dL (ref 0.61–1.24)
GFR calc Af Amer: >60 mL/min (ref >60)
GLUCOSE: 103 mg/dL — AB (ref 65–99)
Potassium: 4.1 mmol/L (ref 3.5–5.1)
Sodium: 135 mmol/L (ref 135–145)

## 2015-07-15 LAB — TRIGLYCERIDES: TRIGLYCERIDES: 134 mg/dL (ref <150)

## 2015-07-15 LAB — MAGNESIUM: Magnesium: 1.8 mg/dL (ref 1.7–2.4)

## 2015-07-15 LAB — PHOSPHORUS: Phosphorus: 3.5 mg/dL (ref 2.5–4.6)

## 2015-07-15 MED ORDER — LORAZEPAM 2 MG/ML IJ SOLN
1.0000 mg | INTRAMUSCULAR | Status: DC | PRN
  Administered 2015-07-15 – 2015-07-16 (×4): 1 mg via INTRAVENOUS
  Filled 2015-07-15 (×4): qty 1

## 2015-07-15 NOTE — Consult Note (Signed)
[facility] Face-to-Face Psychiatry Consult  [name] MRN:  [id number] Date of Evaluation:  07/15/2015 Chief Complaint: Psychiatric Consult   Principal Diagnosis: Attempted Overdose Diagnosis:   Patient Active Problem List   Diagnosis Date Noted  . Alcohol intoxication with blood level over 0.3 [R78.0]   . Elevated transaminase level [R74.0]   . Drug overdose, intentional ([place]) [T50.902A] 07/12/2015  . Alcohol use disorder, severe, dependence ([place]) [F10.20] 04/15/2015  . Alcohol abuse with intoxication ([place]) [F10.129] 04/11/2015  . Cocaine abuse with cocaine-induced mood disorder ([place]) [F14.14] 04/11/2015  . Overdose [T50.901A] 04/10/2015  . Severe recurrent major depressive disorder with psychotic features ([place]) [F33.3]   . Severe major depression with psychotic features ([place]) [F32.3] 09/11/2014  . Alcohol-induced mood disorder ([place]) [F10.94] 09/10/2014  . Suicidal ideation [R45.851]     Subjective: Patient reports " I just want to die, I have a lot going on in my life.   History of Present Illness: PER HPI- 34 yo AAM with history of polysubstance abuse and prior admissions for this. The patient is sedated and intubated and unable to provide a history. Reportedly from the ED: He was BIBA after being found by his roommates with and empty bottle of diphenhydramine (est. 100, 25mg  tablets as well as 3L of wine.  On Evaluation: [name] is awake, alert and oriented X3 , found resting in bed. Patient still endorses suicidal ideation. Reports that "if you discharge me I will attempt to overdose again, until I succeed. Patient reports that he has a court date and needs to be out of the hospital in 3 days.  Patient reports multiple past attempts with reoccurring thoughts.  Denies homicidal ideation. Denies auditory or visual hallucination and does not appear to be responding to internal stimuli. Patient reports he is medication compliant while he is the hospital. states  that he is usually followed by [facility] however" states that "I have a lot going on and they cant help me".  Report learning new coping skills. States his depression 10/10.  Reports fair appetite and resting well. Support, encouragement and reassurance was provided.     Past Psychiatric History: intentional Drug overdose, Etoh dependent, MDD wit psychotic features, Suicidal ideation  Risk to Self: Is patient at risk for suicide?: Yes Risk to Others:   Prior Inpatient Therapy:   Prior Outpatient Therapy:    Alcohol Screening:   Substance Abuse History in the last 12 months:    Previous Psychotropic Medications: YES Psychological Evaluations: YES   Risk to Self: Suicidal Ideation: YES Suicidal Intent: Yes to overdose on medications Is patient at risk for suicide?: YES Suicidal Plan?: No Access to Means: No What has been your use of drugs/alcohol within the last 12 months?: denies Intentional Self Injurious Behavior: None Risk to Others: Homicidal Ideation: No Thoughts of Harm to Others: No Current Homicidal Intent: No Current Homicidal Plan: No Access to Homicidal Means: No History of harm to others?: No Assessment of Violence: None Noted Does patient have access to weapons?: No Criminal Charges Pending?: court day 07/22/2015 per patient Does patient have a court date: No Prior Inpatient Therapy: Prior Inpatient Therapy: Yes Prior Outpatient Therapy: Prior Outpatient Therapy: Yes Prior Therapy Dates: in teens Prior [facility] Facilty[facility]Provider[facility]): [facility] Reason for Treatment: family problems Does patient have an ACCT team?: No Does patient have Intensive In-House Services? : No Does patient have [facility] services? : YES    Past Medical History:  Past Medical History  Diagnosis Date  . Seizures (  [place])   . Hypertension   . Depression   . Pancreatitis   . Liver cirrhosis ([place])   . Coronary artery disease   . Angina   . Shortness of breath   . Headache(784.0)   .  Peripheral vascular disease [facility]) April 2011    Left Pop  . Hypercholesteremia   . Schizophrenia ([place])   . Bipolar 1 disorder ([place])   . Cancer of kidney ([place]) dx'd 04/2013    lt nephrectomy  . Breast CA ([place]) dx'd 2009    bil w/ bil masectomy and oral meds  . H/O suicide attempt 2015    overdose    Past Surgical History  Procedure Laterality Date  . Chest surgery    . Left leg surgery    . Mastectomy    . Left kidney removal    . Breast surgery     Family History:  Family History  Problem Relation Age of Onset  . Stroke Other   . Cancer Other   . Hyperlipidemia Mother   . Hypertension Mother    Family Psychiatric  History: See Above  Social History:  History  Alcohol Use  . 0.0 oz/week     History  Drug Use No    Comment: former    Social History   Social History  . Marital Status: Single    Spouse Name: N/A  . Number of Children: N/A  . Years of Education: N/A   Social History Main Topics  . Smoking status: Current Every Day Smoker -- 0.25 packs/day for 10 years    Types: Cigarettes  . Smokeless tobacco: Never Used  . Alcohol Use: 0.0 oz/week  . Drug Use: No     Comment: former  . Sexual Activity: Yes     Comment: anal   Other Topics Concern  . None   Social History Narrative   Additional Social History:                         Allergies:   Allergies  Allergen Reactions  . Codeine Hives, Itching and Swelling  . Penicillins Swelling    Has patient had a PCN reaction causing immediate rash, facial/tongue/throat swelling, SOB or lightheadedness with hypotension: Yes Has patient had a PCN reaction causing severe rash involving mucus membranes or skin necrosis: Yes Has patient had a PCN reaction that required hospitalization Yes-ed visit Has patient had a PCN reaction occurring within the last 10 years: Yes If all of the above answers are "NO", then may proceed with Cephalosporin use.   . Depakote [Divalproex Sodium] Hives and Other  (See Comments)    "Bug out and hallucinate"  . Morphine Itching  . Depakote Er [Divalproex Sodium Er] Hives  . Oxycodone Itching and Swelling  . Hydrocodone-Acetaminophen Itching and Rash   Lab Results:  Results for orders placed or performed during the hospital encounter of 07/11/15 (from the past 48 hour(s))  Triglycerides     Status: Abnormal   Collection Time: 07/14/15  3:56 AM  Result Value Ref Range   Triglycerides 158 (H) <150 mg/dL    Comment: Performed at [name]  CBC with Differential/Platelet     Status: Abnormal   Collection Time: 07/14/15  3:56 AM  Result Value Ref Range   WBC 3.9 (L) 4.0 - 10.5 K/uL   RBC 4.49 4.22 - 5.81 MIL/uL   Hemoglobin 13.8 13.0 - 17.0 g/dL   HCT 39.7 39.0 - 52.0 %  MCV 88.4 78.0 - 100.0 fL   MCH 30.7 26.0 - 34.0 pg   MCHC 34.8 30.0 - 36.0 g/dL   RDW 14.1 11.5 - 15.5 %   Platelets 125 (L) 150 - 400 K/uL   Neutrophils Relative % 46 %   Neutro Abs 1.8 1.7 - 7.7 K/uL   Lymphocytes Relative 37 %   Lymphs Abs 1.5 0.7 - 4.0 K/uL   Monocytes Relative 11 %   Monocytes Absolute 0.4 0.1 - 1.0 K/uL   Eosinophils Relative 5 %   Eosinophils Absolute 0.2 0.0 - 0.7 K/uL   Basophils Relative 1 %   Basophils Absolute 0.0 0.0 - 0.1 K/uL  Basic metabolic panel     Status: Abnormal   Collection Time: 07/14/15  [id number] AM   Metabolic Disorder Labs:  Lab Results  Component Value Date   HGBA1C 5.3 09/11/2014   MPG 105 09/11/2014   MPG 100 12/24/2012   No results found for: PROLACTIN Lab Results  Component Value Date   CHOL 321* 09/11/2014   TRIG 134 07/15/2015   HDL 77 09/11/2014   CHOLHDL 4.2 09/11/2014   VLDL 15 09/11/2014   LDLCALC 229* 09/11/2014   LDLCALC 181* 04/16/2014    Current Medications: Current Facility-Administered Medications  Medication Dose Route Frequency Provider Last Rate Last Dose  . amLODipine (NORVASC) tablet 5 mg  5 mg Oral Daily Erick Colace, NP   5 mg at 07/15/15 1100  . folic acid (FOLVITE) tablet 1 mg   1 mg Oral Daily Praveen Mannam, MD   1 mg at 07/15/15 1100  . heparin injection 5,000 Units  5,000 Units Subcutaneous 3 times per day Javier Glazier, MD   5,000 Units at 07/15/15 0543  . LORazepam (ATIVAN) injection 1 mg  1 mg Intravenous Q1H PRN Domenic Polite, MD   1 mg at 07/15/15 1223  . metoprolol tartrate (LOPRESSOR) tablet 25 mg  25 mg Oral BID Erick Colace, NP   25 mg at 07/15/15 1100  . traZODone (DESYREL) tablet 100 mg  100 mg Oral QHS PRN Juanito Doom, MD   100 mg at 07/14/15 2110     Musculoskeletal: Strength & Muscle Tone: within normal limits Gait & Station: normal Patient leans: N/A  Psychiatric Specialty Exam: Physical Exam  Nursing note and vitals reviewed. Constitutional: He is oriented to person, place, and time.  Neurological: He is alert and oriented to person, place, and time.  Skin: Skin is warm and dry.    ROS  Blood pressure 126/84, pulse 89, temperature 97.9 F (36.6 C), temperature source Oral, resp. rate 16, height 5\' 6"  (1.676 m), weight 67.3 kg (148 lb 5.9 oz), SpO2 97 %.Body mass index is 23.96 kg/(m^2).  General Appearance: Casual and Guarded  Eye Contact::  Fair  Speech:  Clear and Coherent  Volume:  Normal  Mood:  Anxious, Depressed, Hopeless and Irritable  Affect:  Tearful  Thought Process:  Circumstantial  Orientation:  Full (Time, Place, and Person)  Thought Content:  Hallucinations: None and Rumination with court date and suicidal  thoughts  Suicidal Thoughts:  Yes.  with intent/plan  Homicidal Thoughts:  No  Memory:  Immediate;   Fair Recent;   Fair Remote;   Fair  Judgement:  Poor  Insight:  Lacking and Shallow  Psychomotor Activity:  Restlessness  Concentration:  Poor  Recall:  [place] of Knowledge:Fair  Language: Poor  Akathisia:  No  Handed:  Right  AIMS (if indicated):  Assets:  Financial Resources/Insurance Social Support  ADL's:  Intact  Cognition: WNL  Sleep:         I agree with current treatment  plan on 07/15/2015, Patient seen face-to-face for psychiatric evaluation follow-up, chart reviewed and case discussed with the MD.[place]. Recommend inpatient treatment  Treatment Plan Summary: Daily contact with patient to assess and evaluate symptoms and progress in treatment, Medication management and Plan  -Crisis stabilization -Medication management: Contiune Abilify 10mg  and Cymbalta 30mg  for mood stabilization Continue 1:1 Sitter of safety  Disposition: Recommend psychiatric Inpatient admission when medically cleared     I certify that inpatient services furnished can reasonably be expected to improve the patient's condition.   [name]- FNP-BC 12/27/20162:52 PM

## 2015-07-15 NOTE — Progress Notes (Signed)
[facility] PROGRESS NOTE  Auther Chapla [id number] DOB: 22-Jul-1980 DOA: 07/11/2015 PCP: [place]   PCCM transfer to [facility] 12/27  Assessment/Plan: Overdose /  suicidal ideation noted, unclear if this was suicide attempt -Continue sitter -OD with Benadryl and ETOH, was intubated for airway protection and extubated 12/25 -Psychiatry consulted, eval pending -medically stable for DC , pending psych eval  H/o Renal cancer S/p Left nephroectomy  -Supposedly in remission  Polysubstance abuse/Alcohol intoxication/Cocaine -Alcohol level 447 on admission  -Continue CIWA protocolVitamin B, folic acid, multivitamin -UDS cocaine positive -mild tremors only, no overt withdrawal, day 4 of hospitalization today  Transaminitis -Likely secondary to the above -LFTs improving, -Hepatitis panel negative in september  Hypertension -Continue amlodipine, metoprolol  History of bipolar/schizophrenia -psych to see  Code Status: Full Code Family Communication: none at bedside Disposition Plan: ? [facility] [place]   Consultants:  PCCM  Psych penidng  Procedures:  MV  HPI/Subjective: Feels ok, eating, wants to go home, reportedly has a court date  Objective: Filed Vitals:   07/14/15 2128 07/15/15 0636  BP: 132/89 126/84  Pulse: 91 89  Temp: 98.2 F (36.8 C) 97.9 F (36.6 C)  Resp: 16 16    Intake/Output Summary (Last 24 hours) at 07/15/15 1152 Last data filed at 07/15/15 1100  Gross per 24 hour  Intake  802.5 ml  Output   1150 ml  Net -347.5 ml   Filed Weights   07/13/15 0426 07/13/15 1330 07/14/15 0500  Weight: 73.7 kg (162 lb 7.7 oz) 73 kg (160 lb 15 oz) 67.3 kg (148 lb 5.9 oz)    Exam:   General:  AAOx3  Cardiovascular: S1S2/RRR  Respiratory: CTAB  Abdomen: soft, NT, BS present  Musculoskeletal: no edema   Neuro: mild tremors only   Data Reviewed: Basic Metabolic Panel:  Recent Labs Lab 07/11/15 2305 07/11/15 2306  07/13/15 0450 07/14/15 0356 07/15/15 0550  NA 143  --  138 135 135  K 3.8  --  4.1 3.6 4.1  CL 109  --  103 100* 103  CO2 22  --  26 25 26   GLUCOSE 87  --  80 129* 103*  BUN 11  --  11 7 9   CREATININE 0.91  --  0.95 0.74 0.82  CALCIUM 8.9  --  8.5* 8.8* 9.0  MG  --  2.3 1.4* 1.8 1.8  PHOS  --   --  4.3 3.2 3.5   Liver Function Tests:  Recent Labs Lab 07/11/15 2305 07/13/15 0450  AST 114* 65*  ALT 87* 72*  ALKPHOS 47 49  BILITOT 0.5 0.8  PROT 7.5 6.5  ALBUMIN 4.1 3.6   No results for input(s): LIPASE, AMYLASE in the last 168 hours. No results for input(s): AMMONIA in the last 168 hours. CBC:  Recent Labs Lab 07/11/15 2305 07/13/15 0450 07/14/15 0356 07/15/15 0550  WBC 6.4 3.7* 3.9* 3.9*  NEUTROABS 3.0 2.2 1.8 1.7  HGB 14.7 13.2 13.8 14.0  HCT 43.5 39.6 39.7 40.8  MCV 91.4 91.2 88.4 88.3  PLT 167 111* 125* 147*   Cardiac Enzymes: No results for input(s): CKTOTAL, CKMB, CKMBINDEX, TROPONINI in the last 168 hours. BNP (last 3 results) No results for input(s): BNP in the last 8760 hours.  ProBNP (last 3 results) No results for input(s): PROBNP in the last 8760 hours.  CBG: No results for input(s): GLUCAP in the last 168 hours.  Recent Results (from the past 240 hour(s))  MRSA PCR  Screening     Status: None   Collection Time: 07/12/15  3:20 AM  Result Value Ref Range Status   MRSA by PCR NEGATIVE NEGATIVE Final    Comment:        The GeneXpert MRSA Assay (FDA approved for NASAL specimens only), is one component of a comprehensive MRSA colonization surveillance program. It is not intended to diagnose MRSA infection nor to guide or monitor treatment for MRSA infections.      Studies: No results found.  Scheduled Meds: . amLODipine  5 mg Oral Daily  . folic acid  1 mg Oral Daily  . heparin subcutaneous  5,000 Units Subcutaneous 3 times per day  . metoprolol tartrate  25 mg Oral BID   Continuous Infusions: . dextrose 5 % and 0.45% NaCl 50  mL/hr at 07/15/15 0543   Antibiotics Given (last 72 hours)    None      Active Problems:   Drug overdose, intentional ([place])   Alcohol intoxication with blood level over 0.3   Elevated transaminase level    Time spent: 71min    Latia Mataya  [facility] Pager [phone]. If 7PM-7AM, please contact night-coverage at [email or web address], password [facility] 07/15/2015, 11:52 AM  LOS: 3 days

## 2015-07-16 ENCOUNTER — Encounter: Payer: Self-pay | Admitting: Psychiatry

## 2015-07-16 ENCOUNTER — Inpatient Hospital Stay
Admit: 2015-07-16 | Discharge: 2015-07-17 | DRG: 885 | Disposition: A | Discharge status: Home / Self Care | Payer: No Typology Code available for payment source | Source: Other Acute Inpatient Hospital | Attending: Psychiatry | Admitting: Psychiatry

## 2015-07-16 DIAGNOSIS — Z905 Acquired absence of kidney: Secondary | ICD-10-CM

## 2015-07-16 DIAGNOSIS — F17210 Nicotine dependence, cigarettes, uncomplicated: Secondary | ICD-10-CM | POA: Diagnosis present

## 2015-07-16 DIAGNOSIS — Z888 Allergy status to other drugs, medicaments and biological substances status: Secondary | ICD-10-CM

## 2015-07-16 DIAGNOSIS — I10 Essential (primary) hypertension: Secondary | ICD-10-CM | POA: Diagnosis present

## 2015-07-16 DIAGNOSIS — G4700 Insomnia, unspecified: Secondary | ICD-10-CM | POA: Diagnosis present

## 2015-07-16 DIAGNOSIS — Z85528 Personal history of other malignant neoplasm of kidney: Secondary | ICD-10-CM

## 2015-07-16 DIAGNOSIS — Z8249 Family history of ischemic heart disease and other diseases of the circulatory system: Secondary | ICD-10-CM | POA: Diagnosis not present

## 2015-07-16 DIAGNOSIS — Z853 Personal history of malignant neoplasm of breast: Secondary | ICD-10-CM

## 2015-07-16 DIAGNOSIS — Z886 Allergy status to analgesic agent status: Secondary | ICD-10-CM | POA: Diagnosis not present

## 2015-07-16 DIAGNOSIS — Z9010 Acquired absence of unspecified breast and nipple: Secondary | ICD-10-CM | POA: Diagnosis not present

## 2015-07-16 DIAGNOSIS — E785 Hyperlipidemia, unspecified: Secondary | ICD-10-CM | POA: Diagnosis present

## 2015-07-16 DIAGNOSIS — F419 Anxiety disorder, unspecified: Secondary | ICD-10-CM | POA: Diagnosis present

## 2015-07-16 DIAGNOSIS — R45851 Suicidal ideations: Secondary | ICD-10-CM | POA: Diagnosis present

## 2015-07-16 DIAGNOSIS — E7800 Pure hypercholesterolemia, unspecified: Secondary | ICD-10-CM | POA: Diagnosis present

## 2015-07-16 DIAGNOSIS — Z915 Personal history of self-harm: Secondary | ICD-10-CM

## 2015-07-16 DIAGNOSIS — Z823 Family history of stroke: Secondary | ICD-10-CM

## 2015-07-16 DIAGNOSIS — F323 Major depressive disorder, single episode, severe with psychotic features: Secondary | ICD-10-CM | POA: Diagnosis present

## 2015-07-16 DIAGNOSIS — F1024 Alcohol dependence with alcohol-induced mood disorder: Secondary | ICD-10-CM | POA: Diagnosis present

## 2015-07-16 DIAGNOSIS — Z818 Family history of other mental and behavioral disorders: Secondary | ICD-10-CM | POA: Diagnosis not present

## 2015-07-16 DIAGNOSIS — Z885 Allergy status to narcotic agent status: Secondary | ICD-10-CM

## 2015-07-16 DIAGNOSIS — F17200 Nicotine dependence, unspecified, uncomplicated: Secondary | ICD-10-CM | POA: Diagnosis present

## 2015-07-16 DIAGNOSIS — I2510 Atherosclerotic heart disease of native coronary artery without angina pectoris: Secondary | ICD-10-CM | POA: Diagnosis present

## 2015-07-16 DIAGNOSIS — F333 Major depressive disorder, recurrent, severe with psychotic symptoms: Secondary | ICD-10-CM | POA: Diagnosis present

## 2015-07-16 DIAGNOSIS — G40909 Epilepsy, unspecified, not intractable, without status epilepticus: Secondary | ICD-10-CM | POA: Diagnosis present

## 2015-07-16 DIAGNOSIS — F1020 Alcohol dependence, uncomplicated: Secondary | ICD-10-CM

## 2015-07-16 LAB — CBC WITH DIFFERENTIAL/PLATELET
BASOS ABS: 0 10*3/uL (ref 0.0–0.1)
BASOS PCT: 1 %
Eosinophils Absolute: 0.2 10*3/uL (ref 0.0–0.7)
Eosinophils Relative: 5 %
HEMATOCRIT: 41 % (ref 39.0–52.0)
HEMOGLOBIN: 14.3 g/dL (ref 13.0–17.0)
Lymphocytes Relative: 44 %
Lymphs Abs: 2 10*3/uL (ref 0.7–4.0)
MCH: 30.9 pg (ref 26.0–34.0)
MCHC: 34.9 g/dL (ref 30.0–36.0)
MCV: 88.6 fL (ref 78.0–100.0)
Monocytes Absolute: 0.6 10*3/uL (ref 0.1–1.0)
Monocytes Relative: 13 %
NEUTROS ABS: 1.7 10*3/uL (ref 1.7–7.7)
NEUTROS PCT: 37 %
Platelets: 152 10*3/uL (ref 150–400)
RBC: 4.63 MIL/uL (ref 4.22–5.81)
RDW: 14.2 % (ref 11.5–15.5)
WBC: 4.5 10*3/uL (ref 4.0–10.5)

## 2015-07-16 LAB — BASIC METABOLIC PANEL
ANION GAP: 8 (ref 5–15)
BUN: 8 mg/dL (ref 6–20)
CHLORIDE: 103 mmol/L (ref 101–111)
CO2: 26 mmol/L (ref 22–32)
Calcium: 9 mg/dL (ref 8.9–10.3)
Creatinine, Ser: 0.79 mg/dL (ref 0.61–1.24)
GFR calc non Af Amer: >60 mL/min (ref >60)
Glucose, Bld: 86 mg/dL (ref 65–99)
Potassium: 3.7 mmol/L (ref 3.5–5.1)
Sodium: 137 mmol/L (ref 135–145)

## 2015-07-16 MED ORDER — MAGNESIUM HYDROXIDE 400 MG/5ML PO SUSP: 30.0000 mL | Freq: Every day | ORAL | Status: DC | PRN

## 2015-07-16 MED ORDER — ALUM & MAG HYDROXIDE-SIMETH 200-200-20 MG/5ML PO SUSP: 30.0000 mL | ORAL | Status: DC | PRN

## 2015-07-16 MED ORDER — AMLODIPINE BESYLATE 5 MG PO TABS
5.0000 mg | ORAL_TABLET | Freq: Every day | ORAL | Status: DC
  Administered 2015-07-16 – 2015-07-17 (×2): 5 mg via ORAL
  Filled 2015-07-16 (×2): qty 1

## 2015-07-16 MED ORDER — TRAZODONE HCL 100 MG PO TABS
100.0000 mg | ORAL_TABLET | Freq: Every day | ORAL | Status: DC
  Administered 2015-07-16: 100 mg via ORAL
  Filled 2015-07-16: qty 1

## 2015-07-16 MED ORDER — ATORVASTATIN CALCIUM 20 MG PO TABS: 20.0000 mg | ORAL_TABLET | Freq: Every day | ORAL | Status: DC

## 2015-07-16 MED ORDER — NICOTINE 21 MG/24HR TD PT24: 21.0000 mg | MEDICATED_PATCH | Freq: Every day | TRANSDERMAL | Status: DC

## 2015-07-16 MED ORDER — METOPROLOL TARTRATE 25 MG PO TABS
25.0000 mg | ORAL_TABLET | Freq: Two times a day (BID) | ORAL | Status: DC
  Administered 2015-07-16 – 2015-07-17 (×2): 25 mg via ORAL
  Filled 2015-07-16 (×2): qty 1

## 2015-07-16 MED ORDER — ARIPIPRAZOLE 10 MG PO TABS
10.0000 mg | ORAL_TABLET | Freq: Every day | ORAL | Status: DC
  Administered 2015-07-16: 10 mg via ORAL
  Filled 2015-07-16: qty 1

## 2015-07-16 MED ORDER — DULOXETINE HCL 30 MG PO CPEP
30.0000 mg | ORAL_CAPSULE | Freq: Every day | ORAL | Status: DC
  Administered 2015-07-16: 30 mg via ORAL
  Filled 2015-07-16: qty 1

## 2015-07-16 MED ORDER — ARIPIPRAZOLE 10 MG PO TABS
10.0000 mg | ORAL_TABLET | Freq: Every day | ORAL | Status: DC
  Administered 2015-07-16 – 2015-07-17 (×2): 10 mg via ORAL
  Filled 2015-07-16 (×2): qty 1

## 2015-07-16 MED ORDER — DULOXETINE HCL 30 MG PO CPEP
30.0000 mg | ORAL_CAPSULE | Freq: Every day | ORAL | Status: DC
  Administered 2015-07-16 – 2015-07-17 (×2): 30 mg via ORAL
  Filled 2015-07-16 (×2): qty 1

## 2015-07-16 NOTE — Clinical Social Work Psych Assess (Signed)
Clinical Social Work Nature conservation officer  Clinical Social Worker:  Linna Darner, LCSW Date/Time:  07/16/2015, 12:55 PM Referred By:  Physician Date Referred:  07/16/15 Reason for Referral:  Behavioral Health Issues   Presenting Symptoms/Problems  Presenting Symptoms/Problems(in person's/family's own words):  CSW met with pt at bedside. Prior to admission pt attempted to overdose. He's also been using alcohol and cocaine. He denies current SI and is anxious to return home.    Abuse/Neglect/Trauma History  Abuse/Neglect/Trauma History:  Denies History    Psychiatric History  Psychiatric History:  Outpatient Treatment Psychiatric Medication:  No medication prior to hospitalization. [facility]. He was unable to say when he was last seen.    Current Mental Health Hospitalizations/Previous Mental Health History: Pt receives outpatient treatment "off and on" from [place].    Current Provider:  Beverly Sessions   Previous Inpatient Admission/Date/Reason:  01/16/15, [facility], abdominal pain 01/04/15, [facility], headache 11/19/14, [facility], drug and alcohol assessment 09/10/14, [facility], HI/SI/SA 09/09/14, [facility], unresponsive 08/29/14, [facility], abdominal pain 08/15/14, [facility], dental pain 08/02/14, [facility] Admission   Emotional Health/Current Symptoms  Suicide/Self Harm: Suicide Attempt in the Past (date/description) (Ct attempted to OD prior to his hospitalization. Ct reports a hx of 34 attempts. ) Suicide Attempt in Past (date/description):  Pt refused to discuss prev attempts but based on previous admissions his last attempt appears to be 08/2014.    Psychotic/Dissociative Symptoms  Psychotic/Dissociative Symptoms: None Reported    Attention/Behavioral Symptoms  Attention/Behavioral Symptoms: Within Normal Limits    Cognitive Impairment  Cognitive Impairment:  Within Normal Limits    Mood and Adjustment  Mood and  Adjustment:  Anxious (Pt is wanting to return home ASAP. He doesn't want inpatient MH treatment.)   Stress, Anxiety, Trauma, Any Recent Loss/Stressor  Stress, Anxiety, Trauma, Any Recent Loss/Stressor: Current Legal Problems/Pending Court Date, Other - See Comment (pt declined to say why he has pending court dates stating, "it's personal" Pt is also under financial strain and has a job interview this week that he wants to attend.)    Substance Abuse/Use  Substance Abuse/Use: Current Substance Use (Pt uses alcohol and cocaine) SBIRT Completed (please refer for detailed history): N/A Self-reported Substance Use (last use and frequency):  Pt refused to discuss. Pt's UDS was positive for cocaine.   Urinary Drug Screen Completed: Yes Alcohol Level:  N/A   Environment/Housing/Living Arrangement  Environmental/Housing/Living Arrangement: Stable Housing (Pt lives with two roomates; his parents pay his rent for now.) Who is in the Home:  Two roommates   Emergency Contact: Parents, Kalman Shan and Christia Reading, [phone]   Financial  Financial:  (No insurance )   [name] Strengths and Goals  [name] Strengths and Goals (patient's own words):  Pt would like to resume working and be able to provide for himself financially instead of relying on his parents.    Clinical Social Worker's Interpretive Summary  Clinical Social Workers Interpretive Summary:  CSW met with pt at bedside. It has been recommended that pt go inpatient for [place] treatment however pt is wanting to return home due to pending court dates and a scheduled job interview. Pt denies current SI and is anxious to return home. It appears pt has poor judgement, a hx of impulsivity, and ongoing SA concerns, however he's unwilling to address/speak about this issues at this time. CSW encouraged pt to following recommendations for inpatient MH treatment stressing the importance of getting help prior to returning back  home.   CSW  consulted with attending physician, Dr. Broadus John who agrees to sign IVC paperwork since pt is refusing to go voluntarily. CSW faxed completed IVC paperwork to the magistrates office, [phone].    Disposition  Disposition: Inpatient Referral Made [facility], [place])

## 2015-07-16 NOTE — Progress Notes (Signed)
Discharge instructions accompanied pt, left the unit in stable condition. Transported by [facility] to [facility]

## 2015-07-16 NOTE — Progress Notes (Signed)
Patient with depressed affect, cooperative behavior with skin check and vital sign monitoring. Patient slightly anxious, good eye contact. Alert and oriented x 4. Skin check performed with no wounds or bruises. Surgical scar to bilateral breasts from breast cancer surgery. No contraband found during skin check. Denies SI/HI at this time. Safety maintained.

## 2015-07-16 NOTE — Discharge Summary (Signed)
Physician Discharge Summary  [name] [id number] DOB: 11/13/1980 DOA: 07/11/2015  PCP: [place] date: 07/11/2015 Discharge date: 07/16/2015  Time spent: 45 minutes  Recommendations for Outpatient Follow-up:  1. Inpatient psychiatry   Discharge Diagnoses:  Active Problems:   Drug overdose, intentional ([place])   Alcohol intoxication with blood level over 0.3   Elevated transaminase level   Discharge Condition: stable  Diet recommendation: regular  Filed Weights   07/13/15 1330 07/14/15 0500 07/16/15 0701  Weight: 73 kg (160 lb 15 oz) 67.3 kg (148 lb 5.9 oz) 71.124 kg (156 lb 12.8 oz)    History of present illness:  CHIEF COMPLAINT: Found down HISTORY OF PRESENT ILLNESS:  34 yo AAM with history of polysubstance abuse and prior admissions for this. The patient was sedated and intubated and unable to provide a history. Reportedly from the ED:, brought by EMS after being found by his roommates with and empty bottle of diphenhydramine (est. 100, 25mg  tablets as well as 3L of wine.   Hospital Course:   Overdose / suicidal ideation noted, unclear if this was suicide attempt -Intubated for airway protection on admission, extubated 12/25 -OD with Benadryl and ETOH -Psychiatry consulted, recommended inpatient psychiatry -medically stable for DC to psychiatric facility  H/o Renal cancer S/p Left nephroectomy  -Supposedly in remission  Polysubstance abuse/Alcohol intoxication/Cocaine -Alcohol level 447 on admission  -treated with CIWA protocolVitamin B, folic acid, multivitamin -UDS cocaine positive -had mild tremors yesterday, now resolved, no overt withdrawal, day 5 of hospitalization today  Transaminitis -Likely secondary to the above -LFTs improving, -Hepatitis panel negative in september  Hypertension -Continue amlodipine, metoprolol  History of bipolar/schizophrenia -per  psychiatry  Consultations:  Psychiatry  Discharge Exam: Filed Vitals:   07/16/15 0701 07/16/15 1005  BP: 98/64 119/71  Pulse: 84 80  Temp: 98 F (36.7 C)   Resp: 16 18    General: AAOx3 Cardiovascular: S1S2/RRR Respiratory: CTAB  Discharge Instructions   Discharge Instructions    Diet - low sodium heart healthy    Complete by:  As directed      Increase activity slowly    Complete by:  As directed           Current Discharge Medication List    CONTINUE these medications which have NOT CHANGED   Details  amLODipine (NORVASC) 5 MG tablet Take 1 tablet (5 mg total) by mouth daily. Qty: 30 tablet, Refills: 0    ARIPiprazole (ABILIFY) 10 MG tablet Take 1 tablet (10 mg total) by mouth daily. Qty: 30 tablet, Refills: 0    atorvastatin (LIPITOR) 20 MG tablet Take 25 mg by mouth every morning.    diphenhydrAMINE (BENADRYL) 25 MG tablet Take 50 mg by mouth as needed for itching.    DULoxetine (CYMBALTA) 30 MG capsule Take 1 capsule (30 mg total) by mouth daily. Qty: 30 capsule, Refills: 0    metoprolol tartrate (LOPRESSOR) 25 MG tablet Take 1 tablet (25 mg total) by mouth 2 (two) times daily. Qty: 60 tablet, Refills: 0    traZODone (DESYREL) 100 MG tablet Take 1 tablet (100 mg total) by mouth at bedtime. Qty: 30 tablet, Refills: 0      STOP taking these medications     gabapentin (NEURONTIN) 300 MG capsule      hydrOXYzine (ATARAX/VISTARIL) 25 MG tablet      predniSONE (DELTASONE) 20 MG tablet        Allergies  Allergen Reactions  . Codeine Hives,  Itching and Swelling  . Penicillins Swelling    Has patient had a PCN reaction causing immediate rash, facial/tongue/throat swelling, SOB or lightheadedness with hypotension: Yes Has patient had a PCN reaction causing severe rash involving mucus membranes or skin necrosis: Yes Has patient had a PCN reaction that required hospitalization Yes-ed visit Has patient had a PCN reaction occurring within the last 10  years: Yes If all of the above answers are "NO", then may proceed with Cephalosporin use.   . Depakote [Divalproex Sodium] Hives and Other (See Comments)    "Bug out and hallucinate"  . Morphine Itching  . Depakote Er [Divalproex Sodium Er] Hives  . Oxycodone Itching and Swelling  . Hydrocodone-Acetaminophen Itching and Rash      The results of significant diagnostics from this hospitalization (including imaging, microbiology, ancillary and laboratory) are listed below for reference.    Significant Diagnostic Studies: Ct Head Wo Contrast  07/12/2015  CLINICAL DATA:  Acute onset of medication overdose. Initial encounter. EXAM: CT HEAD WITHOUT CONTRAST TECHNIQUE: Contiguous axial images were obtained from the base of the skull through the vertex without intravenous contrast. COMPARISON:  CT of the head and MRI of the brain performed 12/25/2014 FINDINGS: There is no evidence of acute infarction, mass lesion, or intra- or extra-axial hemorrhage on CT. The posterior fossa, including the cerebellum, brainstem and fourth ventricle, is within normal limits. The third and lateral ventricles, and basal ganglia are unremarkable in appearance. The cerebral hemispheres are symmetric in appearance, with normal gray-white differentiation. No mass effect or midline shift is seen. There is no evidence of fracture; visualized osseous structures are unremarkable in appearance. The orbits are within normal limits. The paranasal sinuses and mastoid air cells are well-aerated. No significant soft tissue abnormalities are seen. IMPRESSION: Unremarkable noncontrast CT of the head. Electronically Signed   By: Garald Balding M.D.   On: 07/12/2015 03:59   Dg Chest Port 1 View  07/11/2015  CLINICAL DATA:  34 year old male found on the floor status post intubation EXAM: PORTABLE CHEST 1 VIEW COMPARISON:  Chest CT dated 06/02/2015 FINDINGS: Endotracheal tube with tip approximately 3 cm above the carina. Single-view of the  chest does not demonstrate any consolidation. No pleural effusion or pneumothorax. The cardiac silhouette is within normal limits. The osseous structures appear unremarkable. IMPRESSION: Endotracheal tube above the carina. Electronically Signed   By: Anner Crete M.D.   On: 07/11/2015 23:55    Microbiology: Recent Results (from the past 240 hour(s))  MRSA PCR Screening     Status: None   Collection Time: 07/12/15  3:20 AM  Result Value Ref Range Status   MRSA by PCR NEGATIVE NEGATIVE Final    Comment:        The GeneXpert MRSA Assay (FDA approved for NASAL specimens only), is one component of a comprehensive MRSA colonization surveillance program. It is not intended to diagnose MRSA infection nor to guide or monitor treatment for MRSA infections.      Labs: Basic Metabolic Panel:  Recent Labs Lab 07/11/15 2305 07/11/15 2306 07/13/15 0450 07/14/15 0356 07/15/15 0550 07/16/15 0443  NA 143  --  138 135 135 137  K 3.8  --  4.1 3.6 4.1 3.7  CL 109  --  103 100* 103 103  CO2 22  --  26 25 26 26   GLUCOSE 87  --  80 129* 103* 86  BUN 11  --  11 7 9 8   CREATININE 0.91  --  0.95 0.74  0.82 0.79  CALCIUM 8.9  --  8.5* 8.8* 9.0 9.0  MG  --  2.3 1.4* 1.8 1.8  --   PHOS  --   --  4.3 3.2 3.5  --    Liver Function Tests:  Recent Labs Lab 07/11/15 2305 07/13/15 0450  AST 114* 65*  ALT 87* 72*  ALKPHOS 47 49  BILITOT 0.5 0.8  PROT 7.5 6.5  ALBUMIN 4.1 3.6   No results for input(s): LIPASE, AMYLASE in the last 168 hours. No results for input(s): AMMONIA in the last 168 hours. CBC:  Recent Labs Lab 07/11/15 2305 07/13/15 0450 07/14/15 0356 07/15/15 0550 07/16/15 0443  WBC 6.4 3.7* 3.9* 3.9* 4.5  NEUTROABS 3.0 2.2 1.8 1.7 1.7  HGB 14.7 13.2 13.8 14.0 14.3  HCT 43.5 39.6 39.7 40.8 41.0  MCV 91.4 91.2 88.4 88.3 88.6  PLT 167 111* 125* 147* 152   Cardiac Enzymes: No results for input(s): CKTOTAL, CKMB, CKMBINDEX, TROPONINI in the last 168 hours. BNP: BNP  (last 3 results) No results for input(s): BNP in the last 8760 hours.  ProBNP (last 3 results) No results for input(s): PROBNP in the last 8760 hours.  CBG: No results for input(s): GLUCAP in the last 168 hours.     SignedDomenic Polite MD  FACP  [facility] 07/16/2015, 12:48 PM

## 2015-07-16 NOTE — Clinical Social Work Note (Addendum)
CSW faxed IVC paperwork and confirmed receipt of documentation with the magistrate, [phone].   Cindra Presume, LCSW [phone] Hospital psychiatric & 5E, 5W [id number] Licensed Clinical Social Worker

## 2015-07-16 NOTE — Consult Note (Signed)
Psychiatric Specialty Exam: Physical Exam  ROS  There were no vitals taken for this visit.There is no weight on file to calculate BMI.  General Appearance: Casual and Guarded  Eye Contact:: Fair  Speech: Clear and Coherent  Volume: Normal  Mood: Anxious, Depressed, Hopeless and Irritable  Affect: Tearful  Thought Process: Circumstantial  Orientation: Full (Time, Place, and Person)  Thought Content: Hallucinations: None and Rumination with court date.  Suicidal Thoughts: no  Homicidal Thoughts: No  Memory: Immediate; Fair Recent; Fair Remote; Fair  Judgement: Poor  Insight: Lacking and Shallow  Psychomotor Activity: normal  Concentration: Poor  Recall: [employer] of Knowledge:Fair  Language: Poor  Akathisia: No  Handed: Right  AIMS (if indicated):   Assets: Financial Resources/Insurance Social Support  ADL's: Intact  Cognition: WNL         Patient was seen today for evaluation of suicide ideations.  He denied suicide and stated that he is in great spirit today.  Patient want to be discharged home to go to a job interview tomorrow.  Patient stated that he was not going to miss a court date for larceny.  Patient was angry and loud when he realized he was not going to be discharged home.  Severe recurrent major depressive disorder with psychotic features ([place])   PLAN: Admit to inpatient Psychiatric unit.  Charmaine Downs   PMHNP-BC  Agree with NP Assessment and Plan as above

## 2015-07-16 NOTE — BH Assessment (Signed)
Patient has been accepted to [facility]  Accepting physician is Dr. Bary Leriche.  Attending Physician will be Dr. Bary Leriche.  Patient has been assigned to room 313, by [place] Nurse Jenneifer.  Call report to [phone].  Representative/Transfer Coordinator is Jaceyon Strole.  [facility] Staff (Sarah, Education officer, museum) made aware of acceptance.

## 2015-07-17 MED ORDER — TRAZODONE HCL 100 MG PO TABS: 100.0000 mg | ORAL_TABLET | Freq: Every day | ORAL | Status: DC

## 2015-07-17 MED ORDER — DULOXETINE HCL 30 MG PO CPEP: 30.0000 mg | ORAL_CAPSULE | Freq: Every day | ORAL | Status: DC

## 2015-07-17 MED ORDER — AMLODIPINE BESYLATE 5 MG PO TABS: 5.0000 mg | ORAL_TABLET | Freq: Every day | ORAL | Status: DC

## 2015-07-17 MED ORDER — ATORVASTATIN CALCIUM 20 MG PO TABS: 25.0000 mg | ORAL_TABLET | ORAL | Status: DC

## 2015-07-17 MED ORDER — ARIPIPRAZOLE 10 MG PO TABS: 10.0000 mg | ORAL_TABLET | Freq: Every day | ORAL | Status: DC

## 2015-07-17 MED ORDER — METOPROLOL TARTRATE 25 MG PO TABS: 25.0000 mg | ORAL_TABLET | Freq: Two times a day (BID) | ORAL | Status: DC

## 2015-07-17 NOTE — BHH Suicide Risk Assessment (Signed)
BHH INPATIENT:  Family/Significant Other Suicide Prevention Education  Suicide Prevention Education:  Education Completed: [name] (mother) [phone] has been identified by the patient as the family member/significant other with whom the patient will be residing, and identified as the person(s) who will aid the patient in the event of a mental health crisis (suicidal ideations/suicide attempt).  With written consent from the patient, the family member/significant other has been provided the following suicide prevention education, prior to the and/or following the discharge of the patient.  The suicide prevention education provided includes the following:  Suicide risk factors  Suicide prevention and interventions  National Suicide Hotline telephone number  [facility] assessment telephone number  [facility] Emergency Assistance [place] and/or Residential Mobile Crisis Unit telephone number  Request made of family/significant other to:  Remove weapons (e.g., guns, rifles, knives), all items previously/currently identified as safety concern.    Remove drugs/medications (over-the-counter, prescriptions, illicit drugs), all items previously/currently identified as a safety concern.  The family member/significant other verbalizes understanding of the suicide prevention education information provided.  The family member/significant other agrees to remove the items of safety concern listed above.  [place] MSW, [place]   07/17/2015, 3:35 PM

## 2015-07-17 NOTE — Progress Notes (Signed)
Patient discharged at this time to Mom and Dad to personal car. Patient acknowledges all belongings are returned. 7 Day med supply given to patient. Patient verbalizes understanding rt recommended discharge plan of care. Safety maintained. Denies SI/HI at this time. Job interview and health insurance are motivators.

## 2015-07-17 NOTE — Progress Notes (Signed)
D: Pt received from [facility] via [place]. Patient alert and oriented x4. Patient denies SI/HI/AVH. Pt affect is anxious.Pt is appropriate to circumstance, pleasant and cooperative. Pt describes primary stressors as financial and legal (pt has court coming up due to [place] charge). Pt stated "I took 5-6 benadryl and drank a bottle of wine" as event that led to admission. Pt stated that it was not a suicide attempt, but "I just wanted to get to sleep." Pt expressed regular alcohol use 3 times a week of 3-4 glasses or wine. Pt indicated 1.5 years ago he was drinking 1/2 gallon of vodka daily, but has decreased considerably since going through addiction treatment. Pt endorses mild anxiety, headache, tremor, and nausea. Pt endorsed not having taken any medications for 2 weeks. Pt has no other complaints. Pt denies feeling depressed. A: Skin and contraband check performed and documented by previous RN. Reviewed admission material with pt. Educated pt on unit policies and guidelines. Oriented pt to unit. Offered active listening and support. Provided therapeutic communication. Administered scheduled medications. Encouraged pt to attend groups and actively participate in care. R: Pt signed appropriate admission documentation. Pt pleasant and cooperative. Pt compliant with medications. Will continue Q15 min. Checks. Safety maintained.
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
## 2015-07-17 NOTE — Tx Team (Signed)
Initial Interdisciplinary Treatment Plan   PATIENT STRESSORS: Financial difficulties Legal issue   PATIENT STRENGTHS: Ability for insight Average or above average intelligence Capable of independent living Communication skills General fund of knowledge Motivation for treatment/growth   PROBLEM LIST: Problem List/Patient Goals Date to be addressed Date deferred Reason deferred Estimated date of resolution  Overdose 07/16/15     Alcohol abuse 07/16/15     Anxiety 07/16/15     Medication non-compliance 07/16/15     "Getting back on Medication" 07/16/15                              DISCHARGE CRITERIA:  Improved stabilization in mood, thinking, and/or behavior Motivation to continue treatment in a less acute level of care  PRELIMINARY DISCHARGE PLAN: Outpatient therapy Return to previous living arrangement  PATIENT/FAMIILY INVOLVEMENT: This treatment plan has been presented to and reviewed with the patient, [name].  The patient and family have been given the opportunity to ask questions and make suggestions.  Laverle Patter Jewelz Ricklefs 07/17/2015, 12:18 AM

## 2015-07-17 NOTE — Progress Notes (Signed)
  [facility] Adult Case Management Discharge Plan :  Will you be returning to the same living situation after discharge:  Yes,  home  At discharge, do you have transportation home?: Yes,  mother Do you have the ability to pay for your medications: Yes,  Mental health   Release of information consent forms completed and in the chart;  [name] signature needed at discharge.  Patient to Follow up at: Follow-up Information    Follow up with [facility].   Specialty:  Behavioral Health   Why:  Your hospital follow up appointment will be walk in. Walk in hours are Monday- Friday between 8:00am and 10:00am. Please take any insurance information and vaild ID.    Contact information:   [address] [place] [address] [phone]       Next level of care provider has access to [place] and Suicide Prevention discussed: Yes,  with patient and mother  Have you used any form of tobacco in the last 30 days? (Cigarettes, Smokeless Tobacco, Cigars, and/or Pipes): Yes  Has patient been referred to the Quitline?: Patient refused referral  Patient has been referred for addiction treatment: Pt. refused referral  Wray Kearns MSW, [place]  07/17/2015, 3:41 PM

## 2015-07-17 NOTE — Tx Team (Signed)
Interdisciplinary Treatment Plan Update (Adult)  Date:  07/17/2015 Time Reviewed:  3:37 PM  Progress in Treatment: Attending groups: No. Participating in groups:  No. Taking medication as prescribed:  Yes. Tolerating medication:  Yes. Family/Significant othe contact made:  Yes, individual(s) contacted:  Mother Patient understands diagnosis:  No. and As evidenced by:  Limited insight  Discussing patient identified problems/goals with staff:  Yes. Medical problems stabilized or resolved:  Yes. Denies suicidal/homicidal ideation: Yes. Issues/concerns per patient self-inventory:  Yes. Other:  New problem(s) identified: No, Describe:  NA  Discharge Plan or Barriers: Pt plans to return home and follow up with outpatient.    Reason for Continuation of Hospitalization: Depression Medication stabilization Suicidal ideation Withdrawal symptoms  Comments: Information was obtained from the patient and the chart. [name] has a long history of mental illness. He was transferred to [facility] from [facility] where he was hospitalized after suicide attempt by overdose on alcohol and benadryl on 07/11/2015. The patient now dnies suicidal intent and tells me that he was just "trying to go to sleep" as he run out of Trazodone. He reports good complaince with medications and denies any symptoms of depression or psychosis. He is not suicidal or homicidal. He reports the usual level of anxiety. He denies heavy drinking and has no symptoms of alcohol withdrawal. He claims to drink 3 glasses of wine a week. He denies other drug use.Past psychiatric history. Long history of drpression, anxiety, psychosis and mood instability. One suicide attempt by overdose two years ago. Several hospitalization. There were multiple medication trials but he is happy with the current regimen. He was a heavy drinker 5 years ago but limits his intake since diagnosis of cancer. Family psychiatric diagnosis. Grandmother with  schizophrenia.Social history. He graduated from high school and has 1.5 year of college. He lives with his aunt. He was denied disability in spite of history of breast and kidney cancer.  Estimated length of stay: Pt will likely d/c today.   New goal(s): NA  Review of initial/current patient goals per problem list:   1.  Goal(s): Patient will participate in aftercare plan * Met:  * Target date: at discharge * As evidenced by: Patient will participate within aftercare plan AEB aftercare provider and housing plan at discharge being identified.   2.  Goal (s): Patient will exhibit decreased depressive symptoms and suicidal ideations. * Met:  *  Target date: at discharge * As evidenced by: Patient will utilize self rating of depression at 3 or below and demonstrate decreased signs of depression or be deemed stable for discharge by MD.   3.  Goal(s): Patient will demonstrate decreased signs and symptoms of anxiety. * Met:  * Target date: at discharge * As evidenced by: Patient will utilize self rating of anxiety at 3 or below and demonstrated decreased signs of anxiety, or be deemed stable for discharge by MD   4.  Goal(s): Patient will demonstrate decreased signs of withdrawal due to substance abuse * Met:  * Target date: at discharge * As evidenced by: Patient will produce a CIWA/COWS score of 0, have stable vitals signs, and no symptoms of withdrawal.  Attendees: Patient:  [name] 12/29/20163:37 PM  Family:   12/29/20163:37 PM  Physician:   Dr. Bary Leriche  12/29/20163:37 PM  Nursing:   Anderson Malta, RN  12/29/20163:37 PM  Case Manager:   12/29/20163:37 PM  Counselor:   12/29/20163:37 PM  Other:  Wray Kearns, [place]  12/29/20163:37 PM  Other:  12/29/20163:37 PM  Other:   12/29/20163:37 PM  Other:  12/29/20163:37 PM  Other:  12/29/20163:37 PM  Other:  12/29/20163:37 PM  Other:  12/29/20163:37 PM  Other:  12/29/20163:37 PM  Other:  12/29/20163:37 PM  Other:   12/29/20163:37  PM   Scribe for Treatment Team:   Wray Kearns .MSW, LCSWA , 07/17/2015, 3:37 PM

## 2015-07-17 NOTE — BHH Suicide Risk Assessment (Signed)
[facility] Discharge Suicide Risk Assessment   Demographic Factors:  Male, [name], lesbian, or bisexual orientation and Unemployed  Total Time spent with patient: 1 hour  Musculoskeletal: Strength & Muscle Tone: within normal limits Gait & Station: normal Patient leans: N/A  Psychiatric Specialty Exam: Physical Exam  Nursing note and vitals reviewed.   Review of Systems  Psychiatric/Behavioral: Positive for depression.  All other systems reviewed and are negative.   Blood pressure 138/94, pulse 98, temperature 98.2 F (36.8 C), temperature source Oral, resp. rate 20, height 5\' 6"  (1.676 m), weight 70.761 kg (156 lb), SpO2 100 %.Body mass index is 25.19 kg/(m^2).  See SRA.                                              Sleep:  Number of Hours: 6.25  Cognition: WNL  ADL's:  Intact   Have you used any form of tobacco in the last 30 days? (Cigarettes, Smokeless Tobacco, Cigars, and/or Pipes): Yes  Has this patient used any form of tobacco in the last 30 days? (Cigarettes, Smokeless Tobacco, Cigars, and/or Pipes) Yes, A prescription for an FDA-approved tobacco cessation medication was offered at discharge and the patient refused  Mental Status Per Nursing Assessment::   On Admission:  NA (Denies SI)  Current Mental Status by Physician: NA  Loss Factors: Decrease in vocational status and Financial problems/change in socioeconomic status  Historical Factors: Prior suicide attempts, Family history of mental illness or substance abuse and Impulsivity  Risk Reduction Factors:   Sense of responsibility to family, Living with another person, especially a relative, Positive social support and Positive therapeutic relationship  Continued Clinical Symptoms:  Depression:   Comorbid alcohol abuse/dependence Impulsivity Severe Alcohol/Substance Abuse/Dependencies  Cognitive Features That Contribute To Risk:  None    Suicide Risk:  Minimal: No identifiable suicidal  ideation.  Patients presenting with no risk factors but with morbid ruminations; may be classified as minimal risk based on the severity of the depressive symptoms  Principal Problem: Severe recurrent major depressive disorder with psychotic features [facility]) Discharge Diagnoses:  Patient Active Problem List   Diagnosis Date Noted  . Tobacco use disorder [F17.200] 07/16/2015  . Elevated transaminase level [R74.0]   . Drug overdose, intentional ([place]) [T50.902A] 07/12/2015  . Alcohol use disorder, severe, dependence ([place]) [F10.20] 04/15/2015  . Cocaine abuse with cocaine-induced mood disorder ([place]) [F14.14] 04/11/2015  . Overdose [T50.901A] 04/10/2015  . Severe recurrent major depressive disorder with psychotic features ([place]) [F33.3]   . Severe major depression with psychotic features ([place]) [F32.3] 09/11/2014  . Alcohol-induced mood disorder ([place]) [F10.94] 09/10/2014  . Suicidal ideation [R45.851]   . Tylenol overdose [T39.1X4A]   . Polysubstance abuse [F19.10]   . Overdose of acetaminophen [T39.1X4A] 08/03/2014  . Overdose by acetaminophen [T39.1X4A] 08/03/2014  . Cocaine abuse [F14.10] 04/16/2014  . Thrombocytopenia ([place]) [D69.6] 04/15/2014  . Urinary tract infection, site not specified [N39.0] 04/15/2014  . Chest pain, unspecified [R07.9] 04/15/2014  . D-dimer, elevated [R79.1] 04/15/2014  . Transaminitis [R74.0] 09/24/2013  . Cocaine dependence ([place]) [F14.20] 09/20/2013  . 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 Seizure ([place]) [R56.9] 03/15/2013  . Syncope [R55] 01/02/2013  . Leukocytopenia, unspecified [D72.819] 01/02/2013  . Left kidney mass [N28.89] 12/24/2012  . PTSD (post-traumatic stress disorder) [F43.10] 07/06/2012  . Major depressive disorder, recurrent episode ([place]) [F33.9] 06/29/2012    Class: Acute  .  Peripheral vascular disease ([place]) [I73.9] 01/14/2012  . SEIZURE DISORDER [R56.9] 10/03/2008  . LIVER FUNCTION TESTS, ABNORMAL [R94.5] 12/29/2007  . HYPERCHOLESTEROLEMIA  [E78.00] 03/21/2007  . Essential hypertension [I10] 03/21/2007    Follow-up Information    Follow up with [facility].   Specialty:  Behavioral Health   Why:  Your hospital follow up appointment will be walk in. Walk in hours are Monday- Friday between 8:00am and 10:00am. Please take any insurance information and vaild ID.    Contact information:   [place] [address] [phone]       Plan Of Care/Follow-up recommendations:  Activity:  as tolerated. Diet:  low sodium heart healthy Other:  keep follow up appointments.  Is patient on multiple antipsychotic therapies at discharge:  No   Has Patient had three or more failed trials of antipsychotic monotherapy by history:  No  Recommended Plan for Multiple Antipsychotic Therapies: NA    Kattie Santoyo 07/17/2015, 12:39 PM
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
## 2015-07-17 NOTE — Plan of Care (Signed)
Problem: Diagnosis: Increased Risk For Suicide Attempt Goal: STG-Patient Will Comply With Medication Regime Outcome: Progressing Pt compliant with medication regimen     

## 2015-07-17 NOTE — Progress Notes (Signed)
Clinical social work note- late entry   [place] confirmed bed availability at [facility]. Per [place] in admissions, pt will have a bed tonight, 07/16/15. CSW completed necessary transportation forms and arranged transportation via sheriff.   Cindra Presume, LCSW [phone] Hospital psychiatric & 5E, 5W [phone] Licensed Clinical Social Worker

## 2015-07-17 NOTE — BHH Group Notes (Signed)
[place] LCSW Group Therapy   07/17/2015 11:00 am  Type of Therapy: Group Therapy   Participation Level: Did Not Attend. Patient invited to participate but declined.    Alphonse Guild. Damiya Sandefur, MSW, LCSWA, LCAS

## 2015-07-17 NOTE — Discharge Summary (Signed)
Physician Discharge Summary Note  Patient:  [name] is an 34 y.o., male MRN:  [id number] DOB:  30-Oct-1980 Patient phone:  [phone] (home)  Patient address:   [address] [place] [address],  Total Time spent with patient: 1 hour  Date of Admission:  07/16/2015 Date of Discharge: 07/17/2015  Reason for Admission:  Suicide attempt.  Principal Problem: Severe recurrent major depressive disorder with psychotic features [facility]) Discharge Diagnoses: Patient Active Problem List   Diagnosis Date Noted  . Tobacco use disorder [F17.200] 07/16/2015  . Elevated transaminase level [R74.0]   . Drug overdose, intentional ([place]) [T50.902A] 07/12/2015  . Alcohol use disorder, severe, dependence ([place]) [F10.20] 04/15/2015  . Cocaine abuse with cocaine-induced mood disorder ([place]) [F14.14] 04/11/2015  . Overdose [T50.901A] 04/10/2015  . Severe recurrent major depressive disorder with psychotic features ([place]) [F33.3]   . Severe major depression with psychotic features ([place]) [F32.3] 09/11/2014  . Alcohol-induced mood disorder ([place]) [F10.94] 09/10/2014  . Suicidal ideation [R45.851]   . Tylenol overdose [T39.1X4A]   . Polysubstance abuse [F19.10]   . Overdose of acetaminophen [T39.1X4A] 08/03/2014  . Overdose by acetaminophen [T39.1X4A] 08/03/2014  . Cocaine abuse [F14.10] 04/16/2014  . Thrombocytopenia ([place]) [D69.6] 04/15/2014  . Urinary tract infection, site not specified [N39.0] 04/15/2014  . Chest pain, unspecified [R07.9] 04/15/2014  . D-dimer, elevated [R79.1] 04/15/2014  . Transaminitis [R74.0] 09/24/2013  . Cocaine dependence ([place]) [F14.20] 09/20/2013  . 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 Seizure ([place]) [R56.9] 03/15/2013  . Syncope [R55] 01/02/2013  . Leukocytopenia, unspecified [D72.819] 01/02/2013  . Left kidney mass [N28.89] 12/24/2012  . PTSD (post-traumatic stress disorder) [F43.10] 07/06/2012  . Major depressive disorder, recurrent episode ([place]) [F33.9] 06/29/2012    Class: Acute  . Peripheral vascular disease ([place]) [I73.9] 01/14/2012  . SEIZURE DISORDER [R56.9] 10/03/2008  . LIVER FUNCTION TESTS, ABNORMAL [R94.5] 12/29/2007  . HYPERCHOLESTEROLEMIA [E78.00] 03/21/2007  . Essential hypertension [I10] 03/21/2007    Past Psychiatric History: depression, anxiety, psychosis, substance abuse.  Past Medical History:  Past Medical History  Diagnosis Date  . Seizures ([place])   . Hypertension   . Depression   . Pancreatitis   . Liver cirrhosis ([place])   . Coronary artery disease   . Angina   . Shortness of breath   . Headache(784.0)   . Peripheral vascular disease [facility]) April 2011    Left Pop  . Hypercholesteremia   . Schizophrenia ([place])   . Bipolar 1 disorder ([place])   . Cancer of kidney ([place]) dx'd 04/2013    lt nephrectomy  . Breast CA ([place]) dx'd 2009    bil w/ bil masectomy and oral meds  . H/O suicide attempt 2015    overdose    Past Surgical History  Procedure Laterality Date  . Chest surgery    . Left leg surgery    . Mastectomy    . Left kidney removal    . Breast surgery     Family History:  Family History  Problem Relation Age of Onset  . Stroke Other   . Cancer Other   . Hyperlipidemia Mother   . Hypertension Mother    Family Psychiatric  History: grandmother with schizophrenia. Social History:  History  Alcohol Use  . 0.0 oz/week     History  Drug Use No    Comment: former    Social History   Social History  . Marital Status: Single    Spouse Name: N/A  . Number of Children: N/A  . Years  of Education: N/A   Social History Main Topics  . Smoking status: Current Every Day Smoker -- 0.25 packs/day for 10 years    Types: Cigarettes  . Smokeless tobacco: Never Used  . Alcohol Use: 0.0 oz/week  . Drug Use: No     Comment: former  . Sexual Activity: Yes    Birth Control/ Protection: Condom     Comment: anal   Other Topics Concern  . None   Social History Narrative    Hospital Course:    [name] is a  34 year old male with a history of depression, anxiety and mood instability admitted after suicide attempt by benadryl overdose.  1. Suicidal ideation. This has resolved. The patient is able to contract for safety  2. Mood/psychosis. He was maintained on Abilify and Cymbalta for depression and mood stabilization.   3. Anxiety. He is on Vistaril.   4. Insomnia. We continued Trazodone.  5. HTN. We continued Metoprolol and Norvasc.  6. Dyslipidemia. We continued Lipitor.   7. Alcohol use. The patient completed detox on medical floor.  8. Substance abuse treatment. he minimizes his problems and declines Residential treatment.   9. Smoking. Nicotine patch was avuilable.   10. He was discharged to home with his mother. He will follow up with [facility].  Physical Findings: AIMS: Facial and Oral Movements Muscles of Facial Expression: None, normal Lips and Perioral Area: None, normal Jaw: None, normal Tongue: None, normal,Extremity Movements Upper (arms, wrists, hands, fingers): None, normal Lower (legs, knees, ankles, toes): None, normal, Trunk Movements Neck, shoulders, hips: None, normal, Overall Severity Severity of abnormal movements (highest score from questions above): None, normal Incapacitation due to abnormal movements: None, normal [name] awareness of abnormal movements (rate only patient's report): No Awareness, Dental Status Current problems with teeth and/or dentures?: Yes Does patient usually wear dentures?: No  CIWA:  CIWA-Ar Total: 7 COWS:     Musculoskeletal: Strength & Muscle Tone: within normal limits Gait & Station: normal Patient leans: N/A  Psychiatric Specialty Exam: Review of Systems  Psychiatric/Behavioral: Positive for depression.  All other systems reviewed and are negative.   Blood pressure 138/94, pulse 98, temperature 98.2 F (36.8 C), temperature source Oral, resp. rate 20, height 5\' 6"  (1.676 m), weight 70.761 kg (156 lb), SpO2 100 %.Body  mass index is 25.19 kg/(m^2).  See SRA.                                                  Sleep:  Number of Hours: 6.25   Have you used any form of tobacco in the last 30 days? (Cigarettes, Smokeless Tobacco, Cigars, and/or Pipes): Yes  Has this patient used any form of tobacco in the last 30 days? (Cigarettes, Smokeless Tobacco, Cigars, and/or Pipes) Yes, Yes, A prescription for an FDA-approved tobacco cessation medication was offered at discharge and the patient refused  Metabolic Disorder Labs:  Lab Results  Component Value Date   HGBA1C 5.3 09/11/2014   MPG 105 09/11/2014   MPG 100 12/24/2012   No results found for: PROLACTIN Lab Results  Component Value Date   CHOL 321* 09/11/2014   TRIG 134 07/15/2015   HDL 77 09/11/2014   CHOLHDL 4.2 09/11/2014   VLDL 15 09/11/2014   LDLCALC 229* 09/11/2014   LDLCALC 181* 04/16/2014    See Psychiatric Specialty Exam and Suicide Risk  Assessment completed by Attending Physician prior to discharge.  Discharge destination:  Home  Is patient on multiple antipsychotic therapies at discharge:  No   Has Patient had three or more failed trials of antipsychotic monotherapy by history:  No  Recommended Plan for Multiple Antipsychotic Therapies: NA  Discharge Instructions    Diet - low sodium heart healthy    Complete by:  As directed      Increase activity slowly    Complete by:  As directed             Medication List    STOP taking these medications        diphenhydrAMINE 25 MG tablet  Commonly known as:  BENADRYL      TAKE these medications      Indication   amLODipine 5 MG tablet  Commonly known as:  NORVASC  Take 1 tablet (5 mg total) by mouth daily.   Indication:  High Blood Pressure     ARIPiprazole 10 MG tablet  Commonly known as:  ABILIFY  Take 1 tablet (10 mg total) by mouth daily.   Indication:  Major Depressive Disorder     atorvastatin 20 MG tablet  Commonly known as:  LIPITOR  Take  1.5 tablets (30 mg total) by mouth every morning.   Indication:  High Amount of Triglycerides in the Blood     DULoxetine 30 MG capsule  Commonly known as:  CYMBALTA  Take 1 capsule (30 mg total) by mouth daily.   Indication:  Major Depressive Disorder, mood stabilization     metoprolol tartrate 25 MG tablet  Commonly known as:  LOPRESSOR  Take 1 tablet (25 mg total) by mouth 2 (two) times daily.   Indication:  High Blood Pressure     traZODone 100 MG tablet  Commonly known as:  DESYREL  Take 1 tablet (100 mg total) by mouth at bedtime.   Indication:  Trouble Sleeping           Follow-up Information    Follow up with [facility].   Specialty:  Behavioral Health   Why:  Your hospital follow up appointment will be walk in. Walk in hours are Monday- Friday between 8:00am and 10:00am. Please take any insurance information and vaild ID.    Contact information:   [place] [address] [phone]       Follow-up recommendations:  Activity:  as tolerated Diet:  low sodium heart healthy Other:  keep follow up appointments.  Comments:    Signed: Thais Silberstein 07/17/2015, 2:02 PM

## 2015-07-17 NOTE — Progress Notes (Signed)
Patient with appropriate affect, cooperative behavior with meals, meds and plan of care. No SI/HI/AVH at this time. Good appetite, good adls. Minimal interaction with peers. Verbalizes needs appropriately with staff. Completes self audit sheet. States goals are to "get on right meds, work on Radiographer, therapeutic and focus on self". Patient has job interview tomorrow with goal of getting health insurance and has upcoming court date. Patient to discharge today when discharge plan and transportation in place. Safety maintained.

## 2015-07-17 NOTE — H&P (Signed)
Psychiatric Admission Assessment Adult  [name] MRN:  [id number] Date of Evaluation:  07/17/2015 Chief Complaint:  depression Principal Diagnosis: Severe recurrent major depressive disorder with psychotic features [facility]) Diagnosis:   Patient Active Problem List   Diagnosis Date Noted  . Tobacco use disorder [F17.200] 07/16/2015  . Elevated transaminase level [R74.0]   . Drug overdose, intentional ([place]) [T50.902A] 07/12/2015  . Alcohol use disorder, severe, dependence ([place]) [F10.20] 04/15/2015  . Cocaine abuse with cocaine-induced mood disorder ([place]) [F14.14] 04/11/2015  . Overdose [T50.901A] 04/10/2015  . Severe recurrent major depressive disorder with psychotic features ([place]) [F33.3]   . Severe major depression with psychotic features ([place]) [F32.3] 09/11/2014  . Alcohol-induced mood disorder ([place]) [F10.94] 09/10/2014  . Suicidal ideation [R45.851]   . Tylenol overdose [T39.1X4A]   . Polysubstance abuse [F19.10]   . Overdose of acetaminophen [T39.1X4A] 08/03/2014  . Overdose by acetaminophen [T39.1X4A] 08/03/2014  . Cocaine abuse [F14.10] 04/16/2014  . Thrombocytopenia ([place]) [D69.6] 04/15/2014  . Urinary tract infection, site not specified [N39.0] 04/15/2014  . Chest pain, unspecified [R07.9] 04/15/2014  . D-dimer, elevated [R79.1] 04/15/2014  . Transaminitis [R74.0] 09/24/2013  . Cocaine dependence ([name]) [F14.20] 09/20/2013  . 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 Seizure ([place]) [R56.9] 03/15/2013  . Syncope [R55] 01/02/2013  . Leukocytopenia, unspecified [D72.819] 01/02/2013  . Left kidney mass [N28.89] 12/24/2012  . PTSD (post-traumatic stress disorder) [F43.10] 07/06/2012  . Major depressive disorder, recurrent episode ([place]) [F33.9] 06/29/2012    Class: Acute  . Peripheral vascular disease ([place]) [I73.9] 01/14/2012  . SEIZURE DISORDER [R56.9] 10/03/2008  . LIVER FUNCTION TESTS, ABNORMAL [R94.5] 12/29/2007  . HYPERCHOLESTEROLEMIA [E78.00]  03/21/2007  . Essential hypertension [I10] 03/21/2007   History of Present Illness:  Identifying data. Mr. Meals is a 34 year old male with a history of depression, psychosis, mood instability and substance abuse.  Chief complaint. "I am fine."  History of present illness. Information was obtained from the patient and the chart. Mr. Barich has a long history of mental illness. He was transferred to [facility] from [facility] where he was hospitalized after suicide attempt by overdose on alcohol and benadryl on 07/11/2015.  The patient now dnies suicidal intent and tells me that he was just "trying to go to sleep" as he run out of Trazodone. He reports good complaince with medications and denies any symptoms of depression or psychosis. He is not suicidal or homicidal. He reports the usual level of anxiety. He denies heavy drinking and has no symptoms of alcohol withdrawal. He claims to drink 3 glasses of wine a week. He denies other drug use.  Past psychiatric history. Long history of drpression, anxiety, psychosis and mood instability. One suicide attempt by overdose two years ago. Several hospitalization. There were multiple medication trials but he is happy with the current regimen. He was a heavy drinker 5 years ago but limits his intake since diagnosis of cancer.  Family psychiatric diagnosis. Grandmother with schizophrenia.  Social history. He graduated from high school and has 1.5 year of college. He lives with his aunt. He was denied disability in spite of history of breast and kidney cancer.  Total Time spent with patient: 1 hour  Past Psychiatric History: Depression, anxiety, psychosis, substance abuse.     Risk to Self: Is patient at risk for suicide?: Yes Risk to Others:   Prior Inpatient Therapy:   Prior Outpatient Therapy:    Alcohol Screening: 1. How often do you have a drink containing alcohol?: 2 to 3  times a week 2. How many drinks containing alcohol do you have on a  typical day when you are drinking?: 3 or 4 3. How often do you have six or more drinks on one occasion?: Monthly Preliminary Score: 3 4. How often during the last year have you found that you were not able to stop drinking once you had started?: Less than monthly 5. How often during the last year have you failed to do what was normally expected from you becasue of drinking?: Monthly 6. How often during the last year have you needed a first drink in the morning to get yourself going after a heavy drinking session?: Monthly 7. How often during the last year have you had a feeling of guilt of remorse after drinking?: Monthly 8. How often during the last year have you been unable to remember what happened the night before because you had been drinking?: Monthly 9. Have you or someone else been injured as a result of your drinking?: Yes, but not in the last year 10. Has a relative or friend or a doctor or another health worker been concerned about your drinking or suggested you cut down?: Yes, but not in the last year Alcohol Use Disorder Identification Test Final Score (AUDIT): 19 Brief Intervention: Yes Substance Abuse History in the last 12 months:  Yes.   Consequences of Substance Abuse: Negative Previous Psychotropic Medications: Yes  Psychological Evaluations: No  Past Medical History:  Past Medical History  Diagnosis Date  . Seizures ([place])   . Hypertension   . Depression   . Pancreatitis   . Liver cirrhosis ([place])   . Coronary artery disease   . Angina   . Shortness of breath   . Headache(784.0)   . Peripheral vascular disease [facility]) April 2011    Left Pop  . Hypercholesteremia   . Schizophrenia ([place])   . Bipolar 1 disorder ([place])   . Cancer of kidney ([place]) dx'd 04/2013    lt nephrectomy  . Breast CA ([place]) dx'd 2009    bil w/ bil masectomy and oral meds  . H/O suicide attempt 2015    overdose    Past Surgical History  Procedure Laterality Date  . Chest surgery    . Left leg  surgery    . Mastectomy    . Left kidney removal    . Breast surgery     Family History:  Family History  Problem Relation Age of Onset  . Stroke Other   . Cancer Other   . Hyperlipidemia Mother   . Hypertension Mother    Family Psychiatric  History: grandmother with schizophrenia. Social History:  History  Alcohol Use  . 0.0 oz/week     History  Drug Use No    Comment: former    Social History   Social History  . Marital Status: Single    Spouse Name: N/A  . Number of Children: N/A  . Years of Education: N/A   Social History Main Topics  . Smoking status: Current Every Day Smoker -- 0.25 packs/day for 10 years    Types: Cigarettes  . Smokeless tobacco: Never Used  . Alcohol Use: 0.0 oz/week  . Drug Use: No     Comment: former  . Sexual Activity: Yes    Birth Control/ Protection: Condom     Comment: anal   Other Topics Concern  . None   Social History Narrative   Additional Social History:  Allergies:   Allergies  Allergen Reactions  . Codeine Hives, Itching and Swelling  . Penicillins Swelling    Has patient had a PCN reaction causing immediate rash, facial/tongue/throat swelling, SOB or lightheadedness with hypotension: Yes Has patient had a PCN reaction causing severe rash involving mucus membranes or skin necrosis: Yes Has patient had a PCN reaction that required hospitalization Yes-ed visit Has patient had a PCN reaction occurring within the last 10 years: Yes If all of the above answers are "NO", then may proceed with Cephalosporin use.   . Depakote [Divalproex Sodium] Hives and Other (See Comments)    "Bug out and hallucinate"  . Morphine Itching  . Depakote Er [Divalproex Sodium Er] Hives  . Oxycodone Itching and Swelling  . Hydrocodone-Acetaminophen Itching and Rash   Lab Results:  Results for orders placed or performed during the hospital encounter of 07/11/15 (from the past 48 hour(s))  CBC with  Differential/Platelet     Status: None   Collection Time: 07/16/15  4:43 AM  Result Value Ref Range   WBC 4.5 4.0 - 10.5 K/uL   RBC 4.63 4.22 - 5.81 MIL/uL   Hemoglobin 14.3 13.0 - 17.0 g/dL   HCT 41.0 39.0 - 52.0 %   MCV 88.6 78.0 - 100.0 fL   MCH 30.9 26.0 - 34.0 pg   MCHC 34.9 30.0 - 36.0 g/dL   RDW 14.2 11.5 - 15.5 %   Platelets 152 150 - 400 K/uL   Neutrophils Relative % 37 %   Neutro Abs 1.7 1.7 - 7.7 K/uL   Lymphocytes Relative 44 %   Lymphs Abs 2.0 0.7 - 4.0 K/uL   Monocytes Relative 13 %   Monocytes Absolute 0.6 0.1 - 1.0 K/uL   Eosinophils Relative 5 %   Eosinophils Absolute 0.2 0.0 - 0.7 K/uL   Basophils Relative 1 %   Basophils Absolute 0.0 0.0 - 0.1 K/uL  Basic metabolic panel     Status: None   Collection Time: 07/16/15  4:43 AM  Result Value Ref Range   Sodium 137 135 - 145 mmol/L   Potassium 3.7 3.5 - 5.1 mmol/L   Chloride 103 101 - 111 mmol/L   CO2 26 22 - 32 mmol/L   Glucose, Bld 86 65 - 99 mg/dL   BUN 8 6 - 20 mg/dL   Creatinine, Ser 0.79 0.61 - 1.24 mg/dL   Calcium 9.0 8.9 - 10.3 mg/dL   GFR calc non Af Amer >60 >60 mL/min   GFR calc Af Amer >60 >60 mL/min    Comment: (NOTE) The eGFR has been calculated using the CKD EPI equation. This calculation has not been validated in all clinical situations. eGFR's persistently <60 mL/min signify possible Chronic Kidney Disease.    Anion gap 8 5 - 15    Metabolic Disorder Labs:  Lab Results  Component Value Date   HGBA1C 5.3 09/11/2014   MPG 105 09/11/2014   MPG 100 12/24/2012   No results found for: PROLACTIN Lab Results  Component Value Date   CHOL 321* 09/11/2014   TRIG 134 07/15/2015   HDL 77 09/11/2014   CHOLHDL 4.2 09/11/2014   VLDL 15 09/11/2014   LDLCALC 229* 09/11/2014   LDLCALC 181* 04/16/2014    Current Medications: Current Facility-Administered Medications  Medication Dose Route Frequency Provider Last Rate Last Dose  . alum & mag hydroxide-simeth (MAALOX/MYLANTA) 200-200-20  MG/5ML suspension 30 mL  30 mL Oral Q4H PRN Clovis Fredrickson, MD      .  amLODipine (NORVASC) tablet 5 mg  5 mg Oral Daily Clovis Fredrickson, MD   5 mg at 07/17/15 0938  . ARIPiprazole (ABILIFY) tablet 10 mg  10 mg Oral Daily Clovis Fredrickson, MD   10 mg at 07/17/15 0939  . atorvastatin (LIPITOR) tablet 20 mg  20 mg Oral q1800 Kijuana Ruppel B Kashaun Bebo, MD      . DULoxetine (CYMBALTA) DR capsule 30 mg  30 mg Oral Daily Clovis Fredrickson, MD   30 mg at 07/17/15 0938  . magnesium hydroxide (MILK OF MAGNESIA) suspension 30 mL  30 mL Oral Daily PRN Alyza Artiaga B Dashanti Burr, MD      . metoprolol tartrate (LOPRESSOR) tablet 25 mg  25 mg Oral BID Clovis Fredrickson, MD   25 mg at 07/17/15 0938  . nicotine (NICODERM CQ - dosed in mg/24 hours) patch 21 mg  21 mg Transdermal Q0600 Clovis Fredrickson, MD   21 mg at 07/17/15 0642  . traZODone (DESYREL) tablet 100 mg  100 mg Oral QHS Laray Rivkin B Jillene Wehrenberg, MD   100 mg at 07/16/15 2308   PTA Medications: Prescriptions prior to admission  Medication Sig Dispense Refill Last Dose  . diphenhydrAMINE (BENADRYL) 25 MG tablet Take 50 mg by mouth as needed for itching.   Past Month at Unknown time  . [DISCONTINUED] amLODipine (NORVASC) 5 MG tablet Take 1 tablet (5 mg total) by mouth daily. 30 tablet 0 Past Month at Unknown time  . [DISCONTINUED] ARIPiprazole (ABILIFY) 10 MG tablet Take 1 tablet (10 mg total) by mouth daily. 30 tablet 0 Past Month at Unknown time  . [DISCONTINUED] atorvastatin (LIPITOR) 20 MG tablet Take 25 mg by mouth every morning.   Past Month at Unknown time  . [DISCONTINUED] DULoxetine (CYMBALTA) 30 MG capsule Take 1 capsule (30 mg total) by mouth daily. 30 capsule 0 Past Month at Unknown time  . [DISCONTINUED] metoprolol tartrate (LOPRESSOR) 25 MG tablet Take 1 tablet (25 mg total) by mouth 2 (two) times daily. 60 tablet 0 Past Month at Unknown time  . [DISCONTINUED] traZODone (DESYREL) 100 MG tablet Take 1 tablet (100 mg total) by mouth  at bedtime. 30 tablet 0 Past Month at Unknown time    Musculoskeletal: Strength & Muscle Tone: within normal limits Gait & Station: normal Patient leans: N/A  Psychiatric Specialty Exam: Physical Exam  Nursing note and vitals reviewed.   Review of Systems  Psychiatric/Behavioral: Positive for depression.  All other systems reviewed and are negative.   Blood pressure 138/94, pulse 98, temperature 98.2 F (36.8 C), temperature source Oral, resp. rate 20, height _0  (1.676 m), weight 70.761 kg (156 lb), SpO2 100 %.Body mass index is 25.19 kg/(m^2).  See SRA.                                                  Sleep:  Number of Hours: 6.25     Treatment Plan Summary: Daily contact with patient to assess and evaluate symptoms and progress in treatment and Medication management   Mr. Monts is a 34 year old male with a history of depression, anxiety and mood instability admitted after suicide attempt by benadryl overdose.  1. Suicidal ideation. This has resolved. The patient is able to contract for safety  2. Mood/psychosis. He was maintained on Abilify and Cymbalta for depression and mood stabilization.  3. Anxiety. He is on Vistaril.   4. Insomnia. We continued Trazodone.  5. HTN. We continued Metoprolol and Norvasc.  6. Dyslipidemia. We continued Lipitor.   7. Alcohol use. The patient completed detox on medical floor.  8. Substance abuse treatment. he minimizes his problems and declines Residential treatment.   9. Smoking. Nicotine patch was avuilable.   10. He was discharged to home with his mother. He will follow up with [facility].  Observation Level/Precautions:  15 minute checks  Laboratory:  CBC Chemistry Profile HCG UDS  Psychotherapy:    Medications:    Consultations:    Discharge Concerns:    Estimated LOS:  Other:     I certify that inpatient services furnished can reasonably be expected to improve the patient's condition.    Cloee Dunwoody 12/29/20161:33 PM

## 2015-08-17 IMAGING — CR DG CHEST 2V
2 series · 2 of 2 positions shown · non-contrast
Comparison: Single view of the chest 01/17/2013.

CLINICAL DATA: Cough for 1 week.

CHEST - 2 VIEW

[w chest pa]
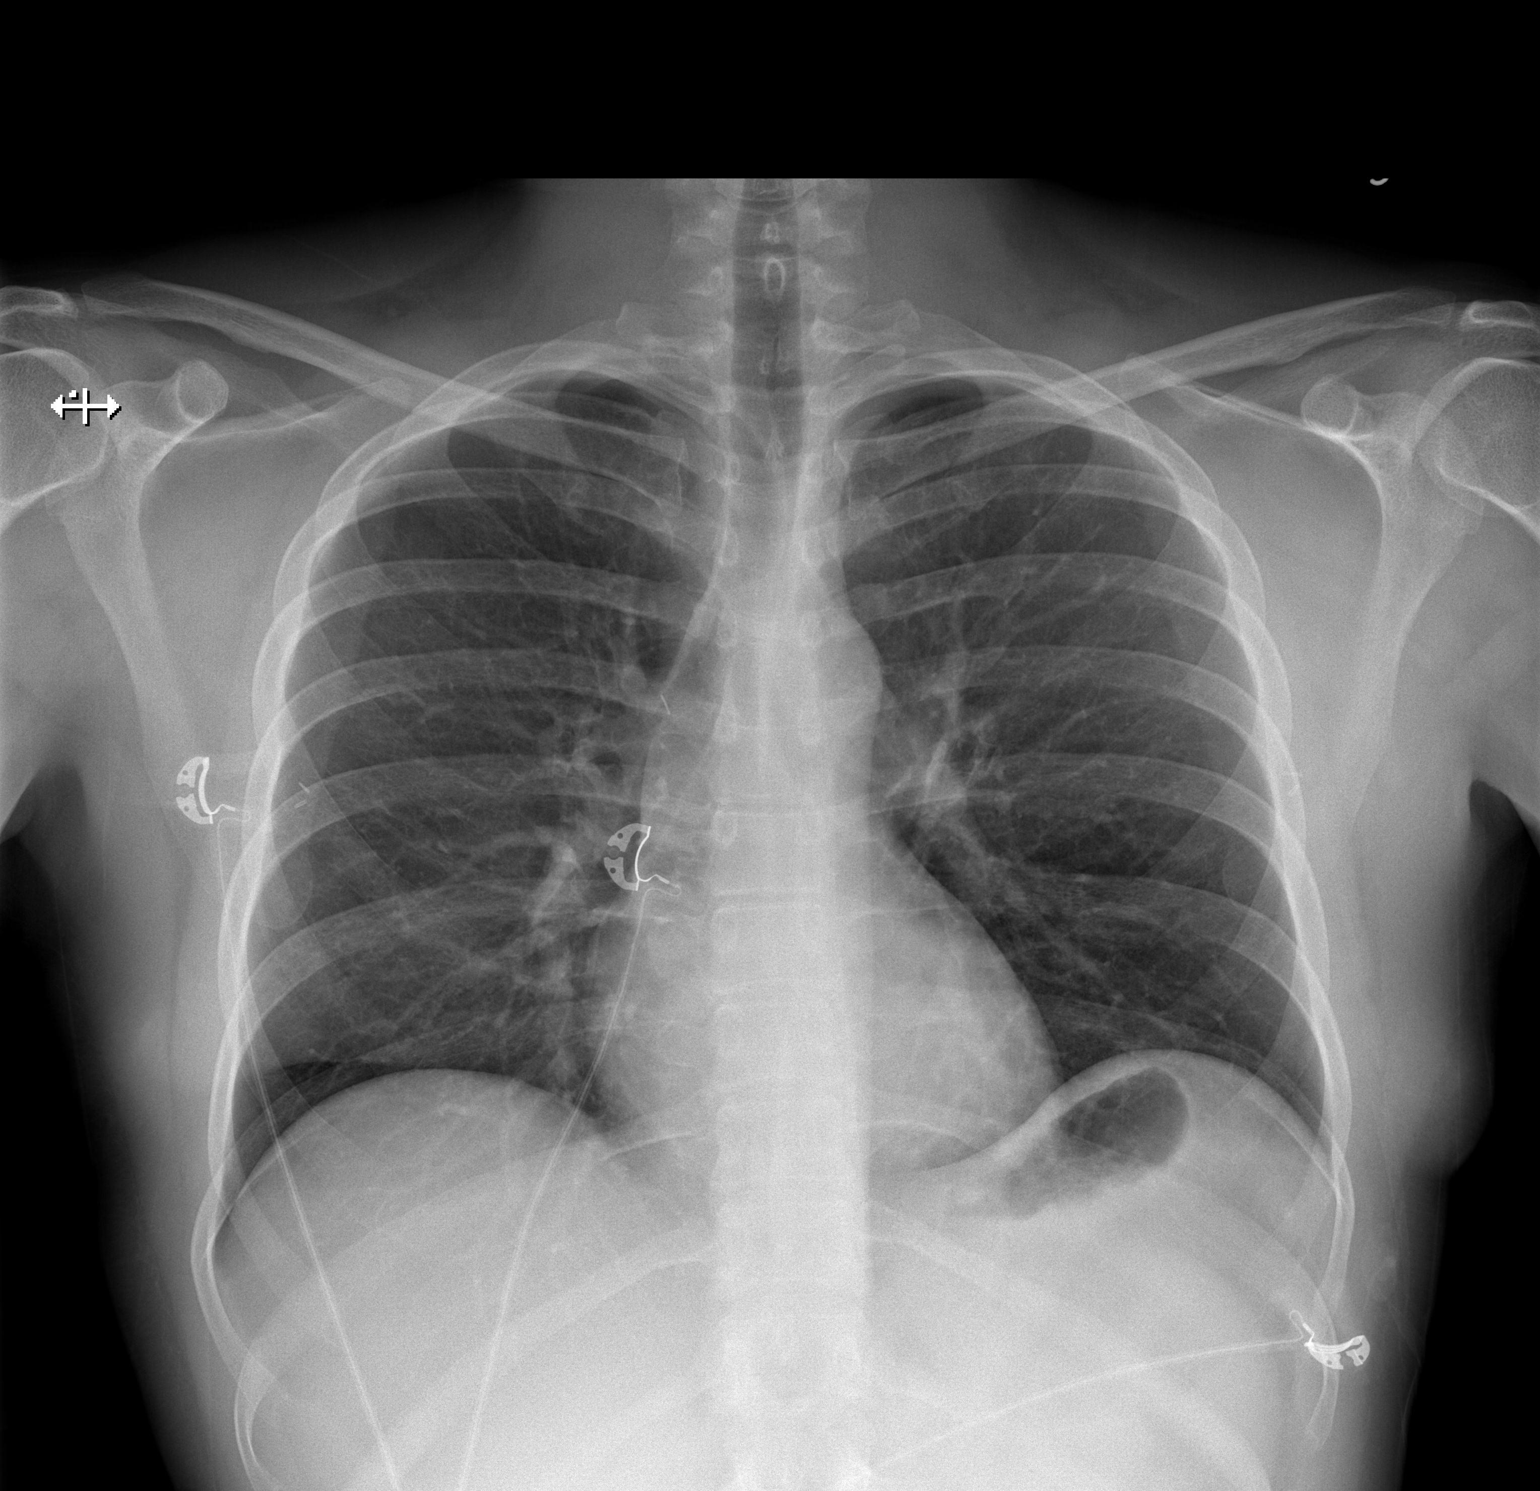

[w chest lat]
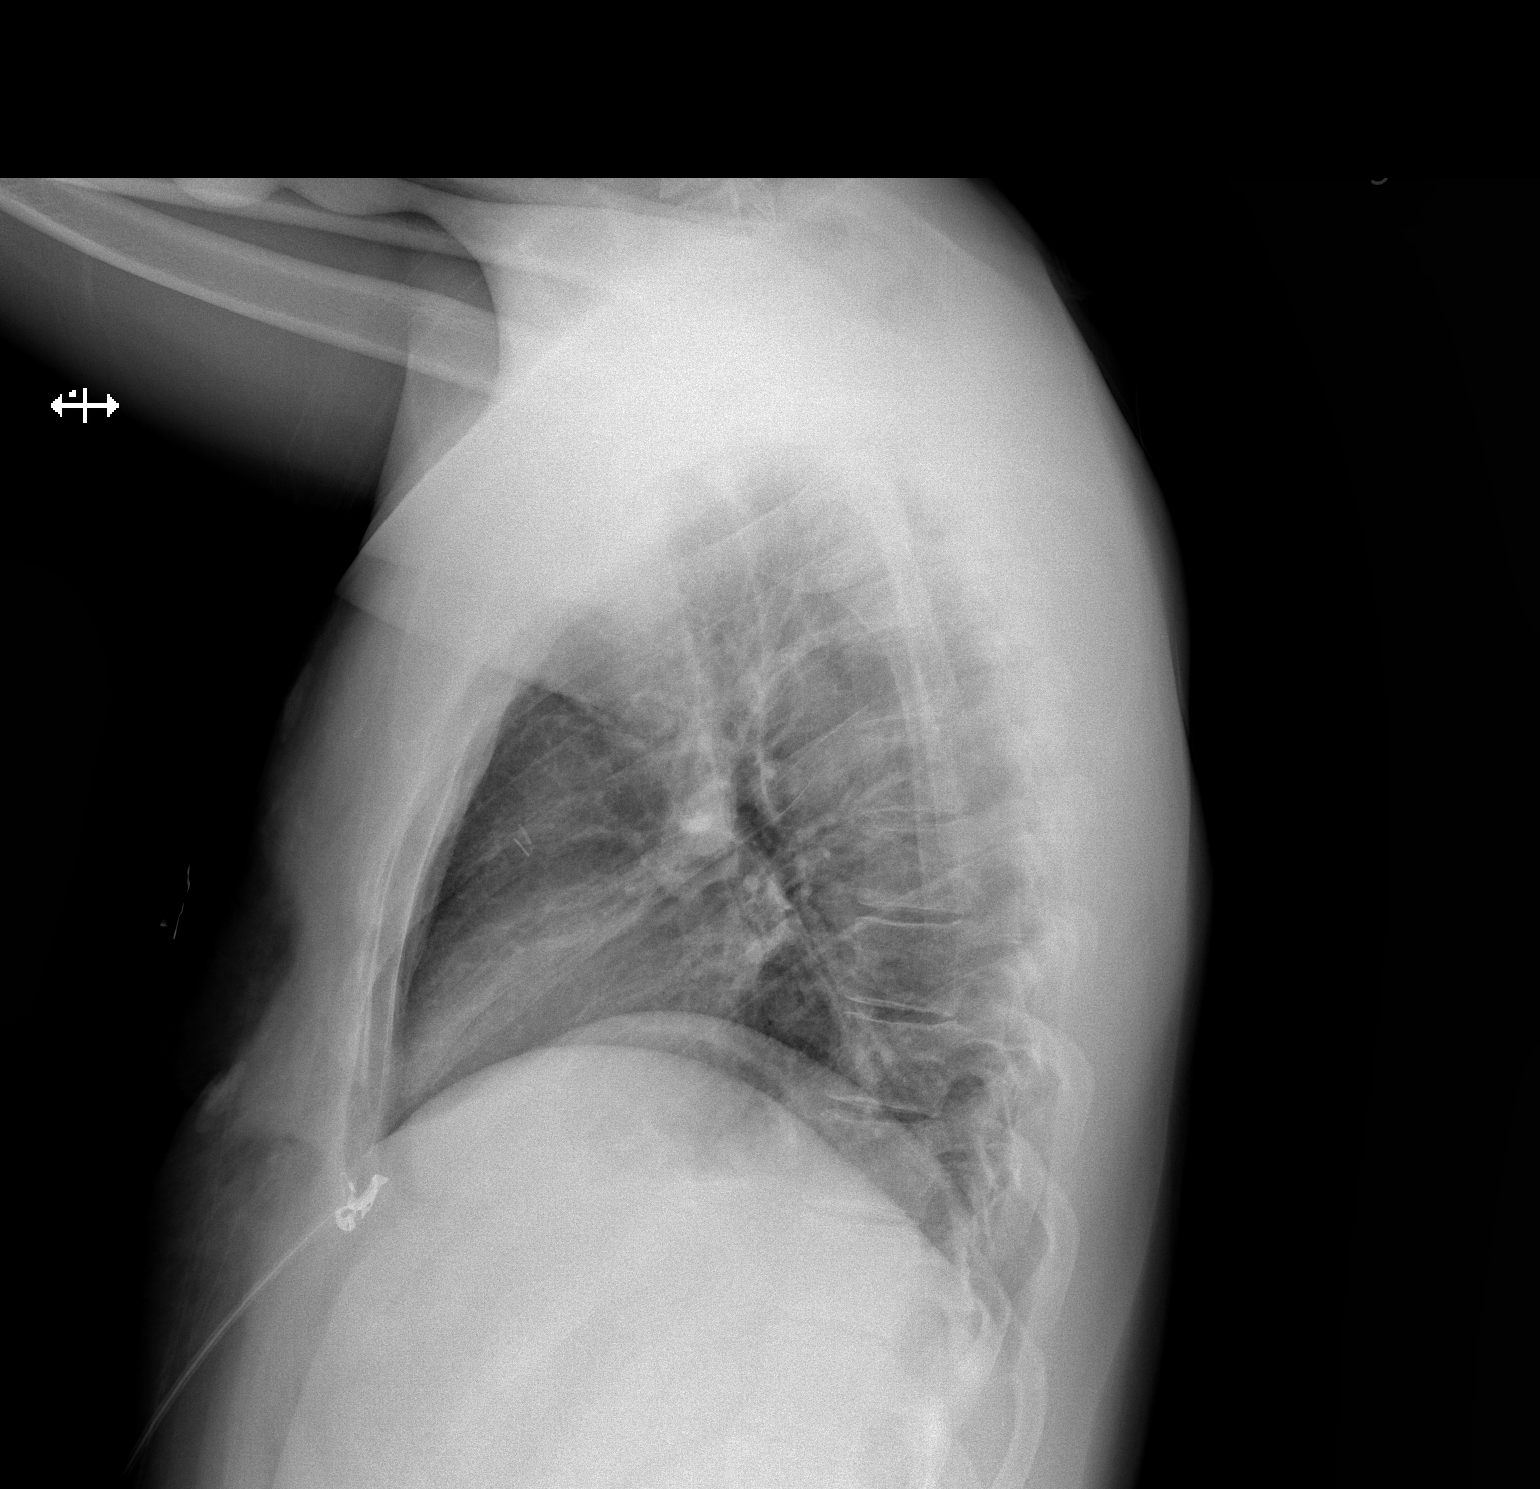

[2 of 2 positions shown; findings below may reference images not displayed]

FINDINGS: Lungs are clear.  Heart size is normal.  No pneumothorax
or pleural fluid.
IMPRESSION: Negative chest.

## 2015-08-17 IMAGING — CT CT HEAD W/O CM
2 series · 16 of 30 positions shown, 20 images · non-contrast
Comparison: Head CT scan 11/30/2012.

CLINICAL DATA: Seizure.

CT HEAD WITHOUT CONTRAST
TECHNIQUE: Contiguous axial images were obtained from the base of
the skull through the vertex without contrast.

[Series 2: head w/o · axial · non-contrast · 0.48mm/px · z∈[-223,-103]mm · 13 of 30 slices shown, 17 images]
[im 3/30  brain]
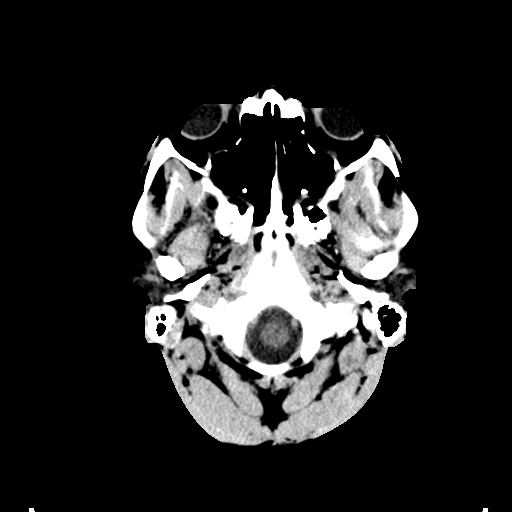
[im 3/30  bone]
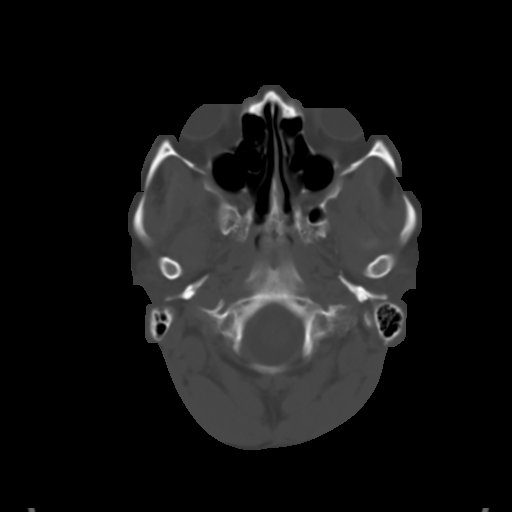
[im 5/30  brain]
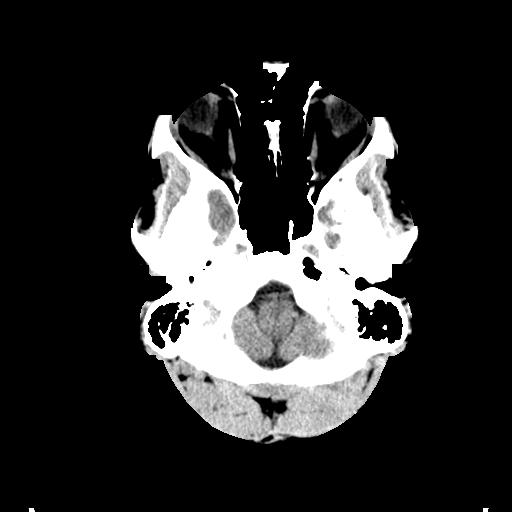
[im 7/30  brain]
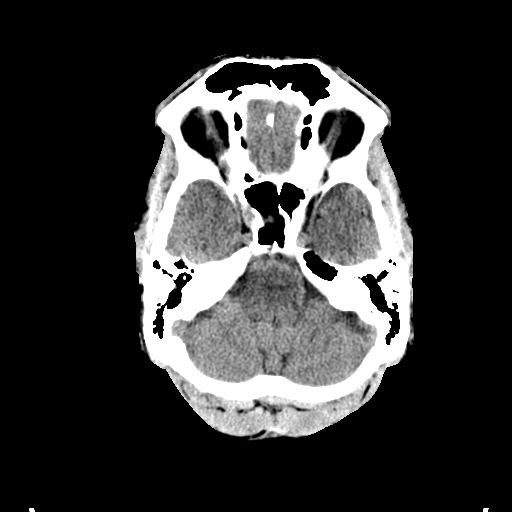
[im 9/30  brain]
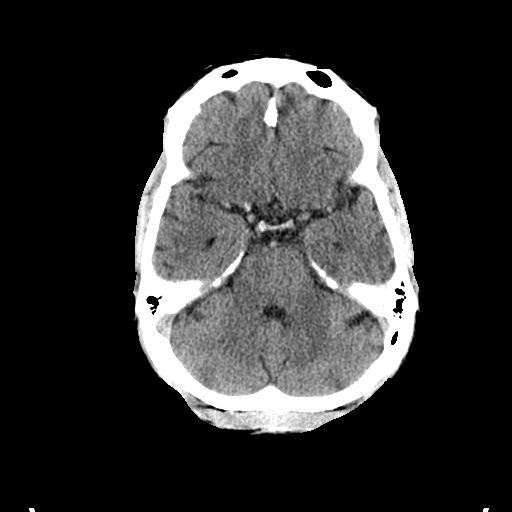
[im 11/30  brain]
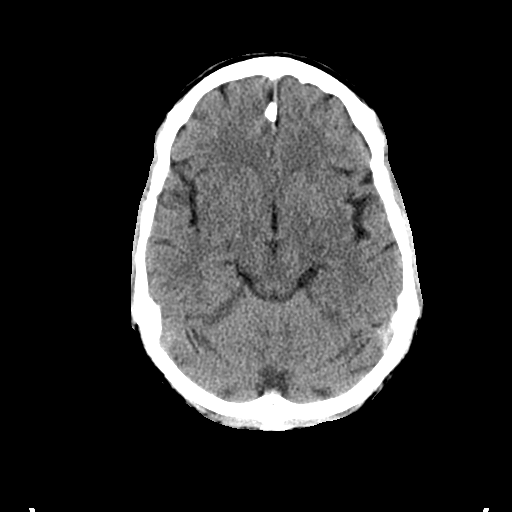
[im 11/30  bone]
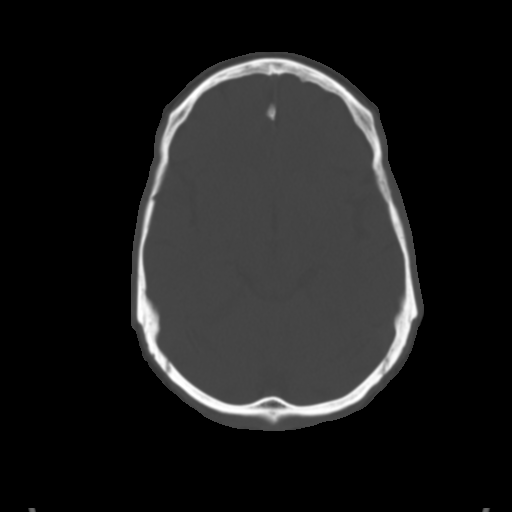
[im 13/30  brain]
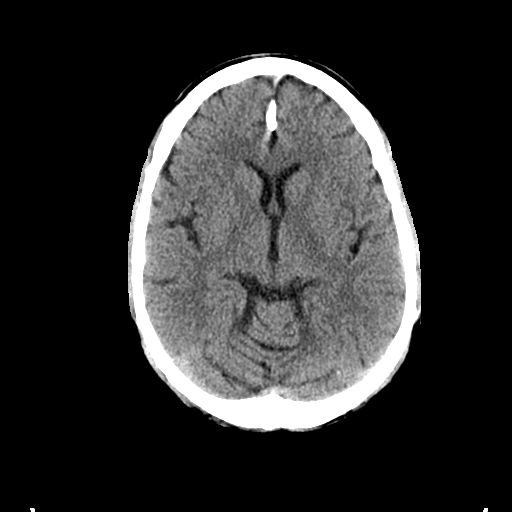
[im 15/30  brain]
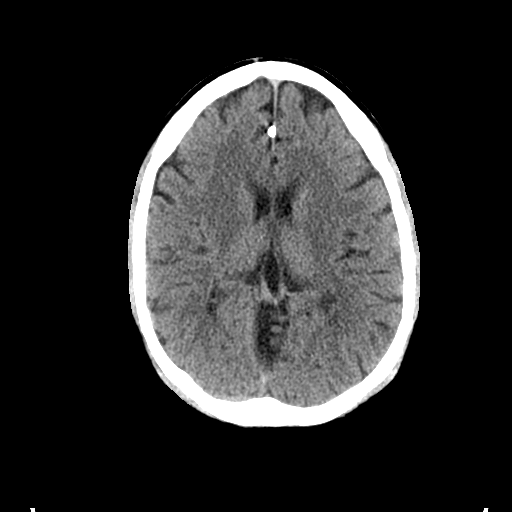
[im 17/30  brain]
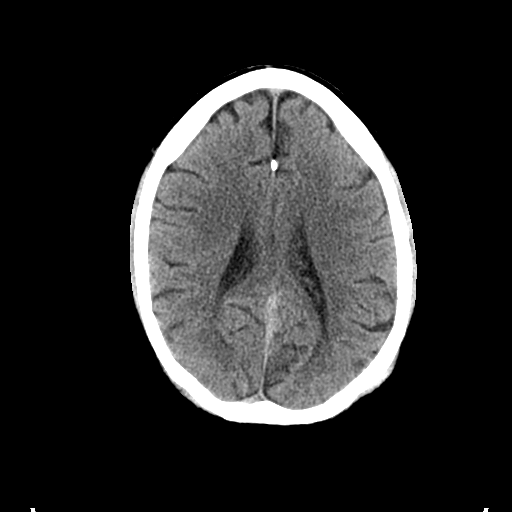
[im 19/30  brain]
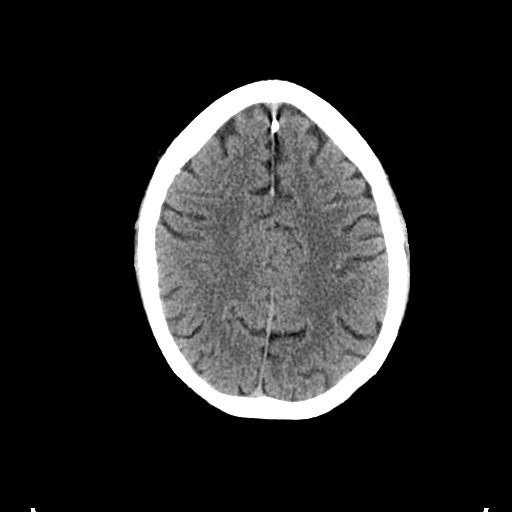
[im 19/30  bone]
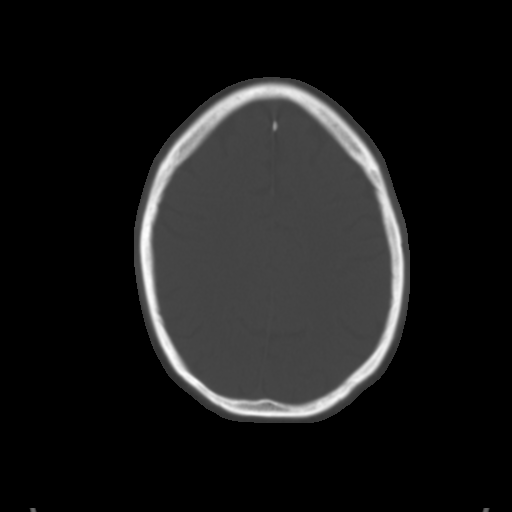
[im 21/30  brain]
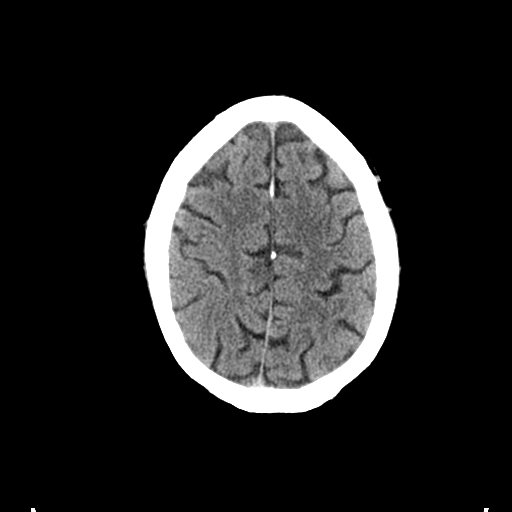
[im 23/30  brain]
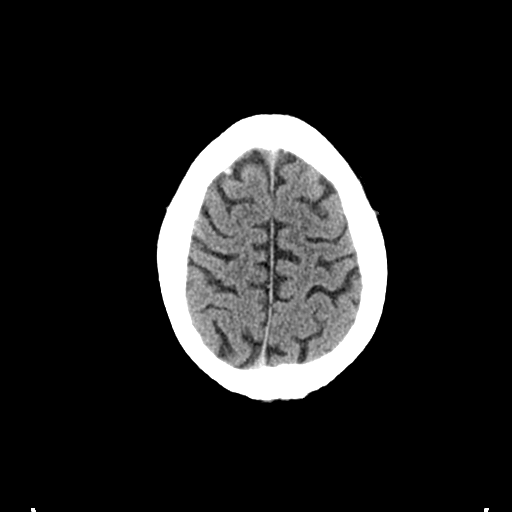
[im 25/30  brain]
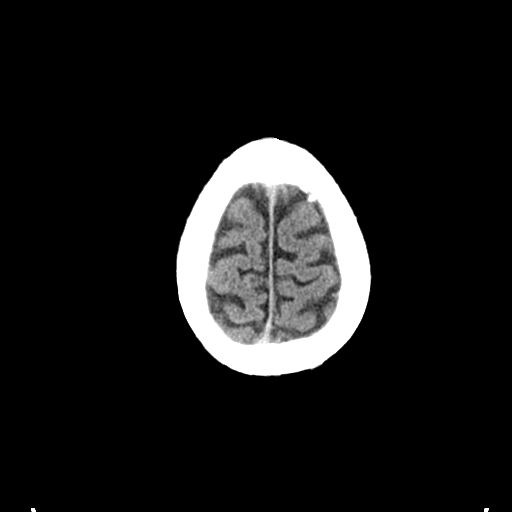
[im 27/30  brain]
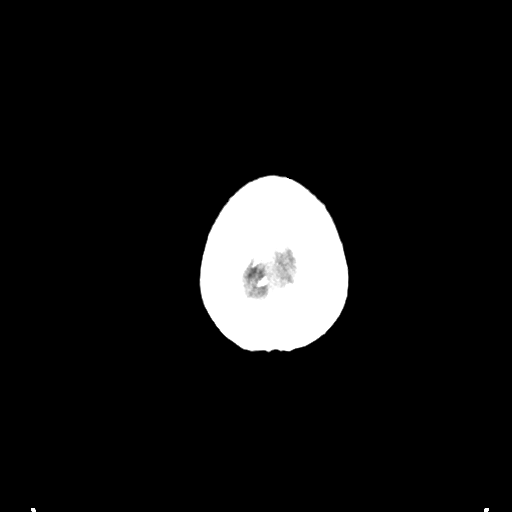
[im 27/30  bone]
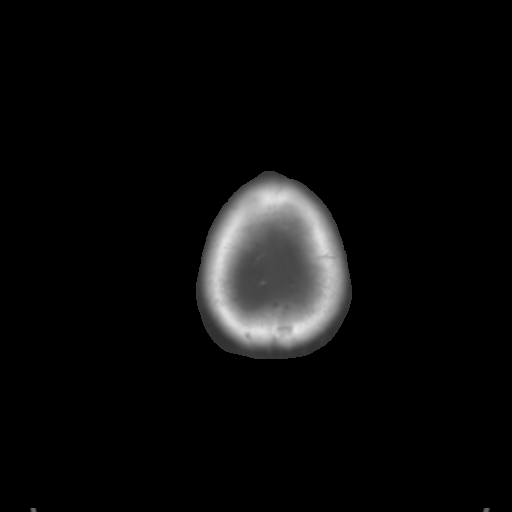

[Series 3: bone windows · axial · 0.48mm/px · z∈[-223,-183]mm · 3 of 30 slices shown]
[im 3/30  bone]
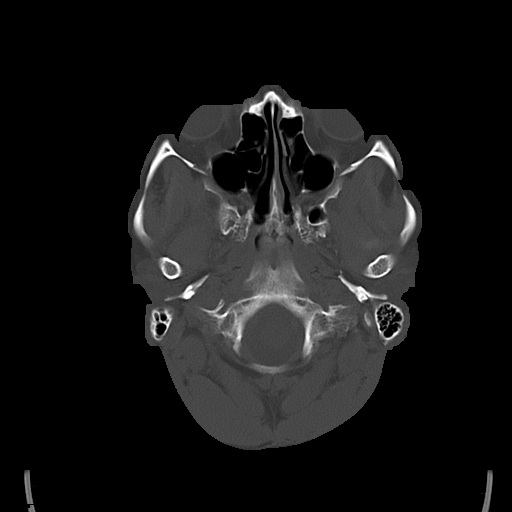
[im 7/30  bone]
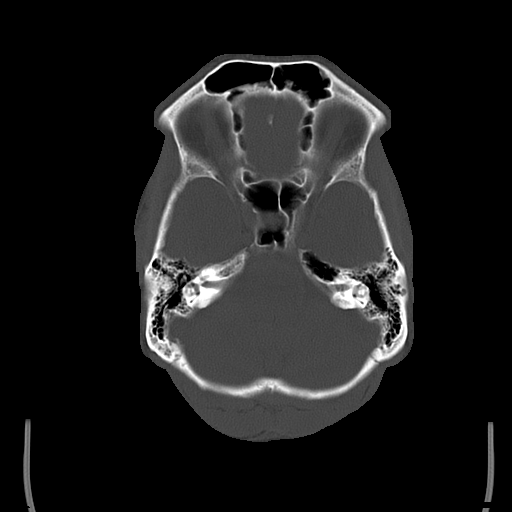
[im 11/30  bone]
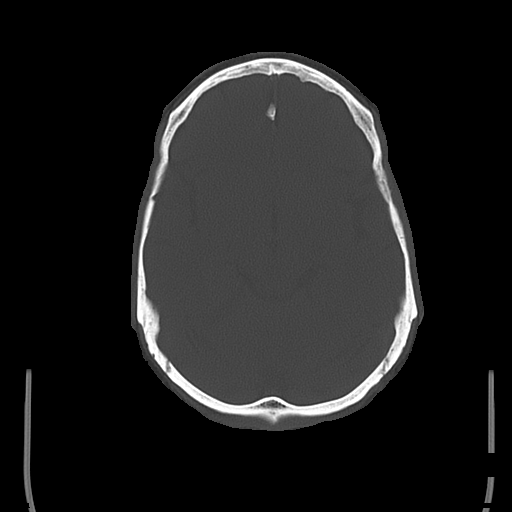

[16 of 30 positions shown; findings below may reference images not displayed]

FINDINGS: There is no evidence of acute intra abnormality including
infarct, hemorrhage, mass lesion, mass effect, midline shift or
abnormal extra-axial fluid collection.  There is no hydrocephalus
or pneumocephalus.  The calvarium is intact.
IMPRESSION: Negative head CT.

## 2015-08-25 ENCOUNTER — Encounter (HOSPITAL_COMMUNITY): Payer: Self-pay | Admitting: Emergency Medicine

## 2015-08-25 ENCOUNTER — Emergency Department (HOSPITAL_COMMUNITY)
Admission: EM | Admit: 2015-08-25 | Discharge: 2015-08-25 | Disposition: A | Discharge status: Home / Self Care | Payer: Self-pay

## 2015-08-25 ENCOUNTER — Encounter: Payer: Self-pay | Admitting: Psychiatry

## 2015-08-25 DIAGNOSIS — E162 Hypoglycemia, unspecified: Secondary | ICD-10-CM | POA: Insufficient documentation

## 2015-08-25 DIAGNOSIS — F10120 Alcohol abuse with intoxication, uncomplicated: Secondary | ICD-10-CM | POA: Insufficient documentation

## 2015-08-25 DIAGNOSIS — R4182 Altered mental status, unspecified: Secondary | ICD-10-CM | POA: Insufficient documentation

## 2015-08-25 DIAGNOSIS — F10920 Alcohol use, unspecified with intoxication, uncomplicated: Secondary | ICD-10-CM

## 2015-08-25 LAB — CBC WITH DIFFERENTIAL/PLATELET
Basophils Absolute: 0 10*3/uL (ref 0.0–0.1)
Basophils Relative: 0 %
EOS PCT: 3 %
Eosinophils Absolute: 0.2 10*3/uL (ref 0.0–0.7)
HEMATOCRIT: 48.9 % (ref 39.0–52.0)
Hemoglobin: 16.8 g/dL (ref 13.0–17.0)
LYMPHS ABS: 3.1 10*3/uL (ref 0.7–4.0)
LYMPHS PCT: 54 %
MCH: 31.7 pg (ref 26.0–34.0)
MCHC: 34.4 g/dL (ref 30.0–36.0)
MCV: 92.3 fL (ref 78.0–100.0)
MONO ABS: 0.4 10*3/uL (ref 0.1–1.0)
MONOS PCT: 8 %
NEUTROS ABS: 2 10*3/uL (ref 1.7–7.7)
Neutrophils Relative %: 35 %
PLATELETS: 214 10*3/uL (ref 150–400)
RBC: 5.3 MIL/uL (ref 4.22–5.81)
RDW: 13.9 % (ref 11.5–15.5)
WBC: 5.7 10*3/uL (ref 4.0–10.5)

## 2015-08-25 LAB — COMPREHENSIVE METABOLIC PANEL
ALBUMIN: 4.3 g/dL (ref 3.5–5.0)
ALT: 31 U/L (ref 17–63)
AST: 41 U/L (ref 15–41)
Alkaline Phosphatase: 47 U/L (ref 38–126)
Anion gap: 16 — ABNORMAL HIGH (ref 5–15)
BUN: 9 mg/dL (ref 6–20)
CHLORIDE: 107 mmol/L (ref 101–111)
CO2: 21 mmol/L — ABNORMAL LOW (ref 22–32)
CREATININE: 0.86 mg/dL (ref 0.61–1.24)
Calcium: 9.6 mg/dL (ref 8.9–10.3)
GFR calc Af Amer: >60 mL/min (ref >60)
GLUCOSE: 63 mg/dL — AB (ref 65–99)
POTASSIUM: 3.2 mmol/L — AB (ref 3.5–5.1)
Sodium: 144 mmol/L (ref 135–145)
Total Bilirubin: 0.2 mg/dL — ABNORMAL LOW (ref 0.3–1.2)
Total Protein: 7.7 g/dL (ref 6.5–8.1)

## 2015-08-25 LAB — ETHANOL: Alcohol, Ethyl (B): 352 mg/dL (ref <5)

## 2015-08-25 LAB — CBG MONITORING, ED: Glucose-Capillary: 114 mg/dL — ABNORMAL HIGH (ref 65–99)

## 2015-08-25 MED ORDER — SODIUM CHLORIDE 0.9 % IV SOLN
1000.0000 mL | Freq: Once | INTRAVENOUS | Status: AC
  Administered 2015-08-25: 1000 mL via INTRAVENOUS

## 2015-08-25 MED ORDER — DEXTROSE 50 % IV SOLN
25.0000 mL | Freq: Once | INTRAVENOUS | Status: AC
  Administered 2015-08-25: 25 mL via INTRAVENOUS
  Filled 2015-08-25: qty 50

## 2015-08-25 MED ORDER — DEXTROSE 50 % IV SOLN: 12.5000 g | Freq: Once | INTRAVENOUS | Status: DC

## 2015-08-25 MED ORDER — SODIUM CHLORIDE 0.9 % IV SOLN
1000.0000 mL | INTRAVENOUS | Status: DC
  Administered 2015-08-25: 1000 mL via INTRAVENOUS

## 2015-08-25 NOTE — ED Notes (Signed)
Bed: [id number] Expected date:  Expected time:  Means of arrival:  Comments: 35 yo F  ETOH, syncope

## 2015-08-25 NOTE — ED Notes (Signed)
Patient awake, oriented, now giving name, dob, and identifying information. Patient requests assistance with transportation home, coffee, and breakfast. Patient given bus pass.  Patient identifies himself as [name], May 19, 1981. Registration notified so that records from this visit can be moved to correct patient's record.

## 2015-08-25 NOTE — ED Provider Notes (Addendum)
CSN: [id number]     Arrival date & time 08/25/15  0019 History  By signing my name below, I, Irene Pap, attest that this documentation has been prepared under the direction and in the presence of Delora Fuel, MD. Electronically Signed: Irene Pap, ED Scribe. 08/25/2015. 3:14 AM.  Chief Complaint  Patient presents with  . Alcohol Intoxication   The history is provided by the EMS personnel. No language interpreter was used.  HPI Comments (Level 5 Caveat due to mental status change): [name] is a 35 y.o. Male brought in by EMS who presents to the Emergency Department complaining of alcohol intoxication onset PTA. Per triage note, pt was seen by a bystander to be walking with an unsteady gait, fell over twice, then proceeded to "pass out." Pt was alert when EMS arrived. Pt states that he knows where he is and has been drinking a lot tonight.   History reviewed. No pertinent past medical history. History reviewed. No pertinent past surgical history. History reviewed. No pertinent family history. Social History  Substance Use Topics  . Smoking status: None  . Smokeless tobacco: None  . Alcohol Use: Yes    Review of Systems  Unable to perform ROS: Mental status change   Allergies  Review of patient's allergies indicates not on file.  Home Medications   Prior to Admission medications   Not on File   BP 138/106 mmHg  Pulse 91  Temp(Src) 97.3 F (36.3 C) (Oral)  Resp 20  SpO2 99% Physical Exam  Constitutional: He appears well-developed and well-nourished.  HENT:  Head: Normocephalic and atraumatic.  Eyes: EOM are normal. Pupils are equal, round, and reactive to light.  Neck: Normal range of motion. Neck supple. No JVD present.  Cardiovascular: Normal rate, regular rhythm and normal heart sounds.  Exam reveals no gallop and no friction rub.   No murmur heard. Pulmonary/Chest: Effort normal and breath sounds normal. He has no wheezes. He has no rales. He exhibits no  tenderness.  Abdominal: Soft. Bowel sounds are normal. He exhibits no distension and no mass. There is no tenderness.  Musculoskeletal: Normal range of motion.  Lymphadenopathy:    He has no cervical adenopathy.  Neurological: No cranial nerve deficit.  Oriented to person but not place or time; asleep but minimally arousable; can answer simple questions  Skin: Skin is warm and dry. No rash noted.  Psychiatric: His behavior is normal.  Nursing note and vitals reviewed.   ED Course  Procedures (including critical care time) DIAGNOSTIC STUDIES: Oxygen Saturation is 99% on RA, normal by my interpretation.    COORDINATION OF CARE: 1:38 AM-labs  Labs Review Results for orders placed or performed during the hospital encounter of 08/25/15  Comprehensive metabolic panel  Result Value Ref Range   Sodium 144 135 - 145 mmol/L   Potassium 3.2 (L) 3.5 - 5.1 mmol/L   Chloride 107 101 - 111 mmol/L   CO2 21 (L) 22 - 32 mmol/L   Glucose, Bld 63 (L) 65 - 99 mg/dL   BUN 9 6 - 20 mg/dL   Creatinine, Ser 0.86 0.61 - 1.24 mg/dL   Calcium 9.6 8.9 - 10.3 mg/dL   Total Protein 7.7 6.5 - 8.1 g/dL   Albumin 4.3 3.5 - 5.0 g/dL   AST 41 15 - 41 U/L   ALT 31 17 - 63 U/L   Alkaline Phosphatase 47 38 - 126 U/L   Total Bilirubin 0.2 (L) 0.3 - 1.2 mg/dL   GFR  calc non Af Amer >60 >60 mL/min   GFR calc Af Amer >60 >60 mL/min   Anion gap 16 (H) 5 - 15  Ethanol  Result Value Ref Range   Alcohol, Ethyl (B) 352 (HH) <5 mg/dL  CBC with Differential  Result Value Ref Range   WBC 5.7 4.0 - 10.5 K/uL   RBC 5.30 4.22 - 5.81 MIL/uL   Hemoglobin 16.8 13.0 - 17.0 g/dL   HCT 48.9 39.0 - 52.0 %   MCV 92.3 78.0 - 100.0 fL   MCH 31.7 26.0 - 34.0 pg   MCHC 34.4 30.0 - 36.0 g/dL   RDW 13.9 11.5 - 15.5 %   Platelets 214 150 - 400 K/uL   Neutrophils Relative % 35 %   Neutro Abs 2.0 1.7 - 7.7 K/uL   Lymphocytes Relative 54 %   Lymphs Abs 3.1 0.7 - 4.0 K/uL   Monocytes Relative 8 %   Monocytes Absolute 0.4 0.1 -  1.0 K/uL   Eosinophils Relative 3 %   Eosinophils Absolute 0.2 0.0 - 0.7 K/uL   Basophils Relative 0 %   Basophils Absolute 0.0 0.0 - 0.1 K/uL  POC CBG, ED  Result Value Ref Range   Glucose-Capillary 114 (H) 65 - 99 mg/dL   I have personally reviewed and evaluated these lab results as part of my medical decision-making.   MDM   Final diagnoses:  Alcohol intoxication, uncomplicated ([place])  Hypoglycemia    Alcohol intoxication. Patient was minimally responsive initially. He was noted to be mildly hyperglycemic and was given a dose of dextrose and blood sugar came up. He is observed in the ED and has awakened. He is now conversant and ambulatory and is discharged. He has no prior records and the [facility] system.  I personally performed the services described in this documentation, which was scribed in my presence. The recorded information has been reviewed and is accurate.      Delora Fuel, MD [id number]  Delora Fuel, MD [id number]

## 2015-08-25 NOTE — ED Notes (Signed)
[place] by [place] with c/o alcohol intoxication.  Per EMS, pt was observed by a bystander that he fell twice while walking with very unsteady gait, and then was observed "passing out".  Pt was awake on EMS' arrival at the scene.  Pt admitted to drinking "a lot" of beer and liquor.  Pt denies pain or headache.

## 2015-08-25 NOTE — Discharge Instructions (Signed)
Alcohol Intoxication  Alcohol intoxication occurs when the amount of alcohol that a person has consumed impairs his or her ability to mentally and physically function. Alcohol directly impairs the normal chemical activity of the brain. Drinking large amounts of alcohol can lead to changes in mental function and behavior, and it can cause many physical effects that can be harmful.   Alcohol intoxication can range in severity from mild to very severe. Various factors can affect the level of intoxication that occurs, such as the person's age, gender, weight, frequency of alcohol consumption, and the presence of other medical conditions (such as diabetes, seizures, or heart conditions). Dangerous levels of alcohol intoxication may occur when people drink large amounts of alcohol in a short period (binge drinking). Alcohol can also be especially dangerous when combined with certain prescription medicines or "recreational" drugs.  SIGNS AND SYMPTOMS  Some common signs and symptoms of mild alcohol intoxication include:  · Loss of coordination.  · Changes in mood and behavior.  · Impaired judgment.  · Slurred speech.  As alcohol intoxication progresses to more severe levels, other signs and symptoms will appear. These may include:  · Vomiting.  · Confusion and impaired memory.  · Slowed breathing.  · Seizures.  · Loss of consciousness.  DIAGNOSIS   Your health care provider will take a medical history and perform a physical exam. You will be asked about the amount and type of alcohol you have consumed. Blood tests will be done to measure the concentration of alcohol in your blood. In many places, your blood alcohol level must be lower than 80 mg/dL (0.08%) to legally drive. However, many dangerous effects of alcohol can occur at much lower levels.   TREATMENT   People with alcohol intoxication often do not require treatment. Most of the effects of alcohol intoxication are temporary, and they go away as the alcohol naturally  leaves the body. Your health care provider will monitor your condition until you are stable enough to go home. Fluids are sometimes given through an IV access tube to help prevent dehydration.   HOME CARE INSTRUCTIONS  · Do not drive after drinking alcohol.  · Stay hydrated. Drink enough water and fluids to keep your urine clear or pale yellow. Avoid caffeine.    · Only take over-the-counter or prescription medicines as directed by your health care provider.    SEEK MEDICAL CARE IF:   · You have persistent vomiting.    · You do not feel better after a few days.  · You have frequent alcohol intoxication. Your health care provider can help determine if you should see a substance use treatment counselor.  SEEK IMMEDIATE MEDICAL CARE IF:   · You become shaky or tremble when you try to stop drinking.    · You shake uncontrollably (seizure).    · You throw up (vomit) blood. This may be bright red or may look like black coffee grounds.    · You have blood in your stool. This may be bright red or may appear as a black, tarry, bad smelling stool.    · You become lightheaded or faint.    MAKE SURE YOU:   · Understand these instructions.  · Will watch your condition.  · Will get help right away if you are not doing well or get worse.     This information is not intended to replace advice given to you by your health care provider. Make sure you discuss any questions you have with your health care provider.       Document Released: 04/14/2005 Document Revised: 03/07/2013 Document Reviewed: 12/08/2012  Elsevier Interactive Patient Education ©2016 [employer]

## 2015-09-20 IMAGING — CR DG CHEST 2V
2 series · 2 of 2 positions shown · non-contrast
Comparison: March 15, 2013

CLINICAL DATA: Chest pain and shortness of breath; fever; history
of breast carcinoma

EXAM:
CHEST  2 VIEW

[w chest pa]
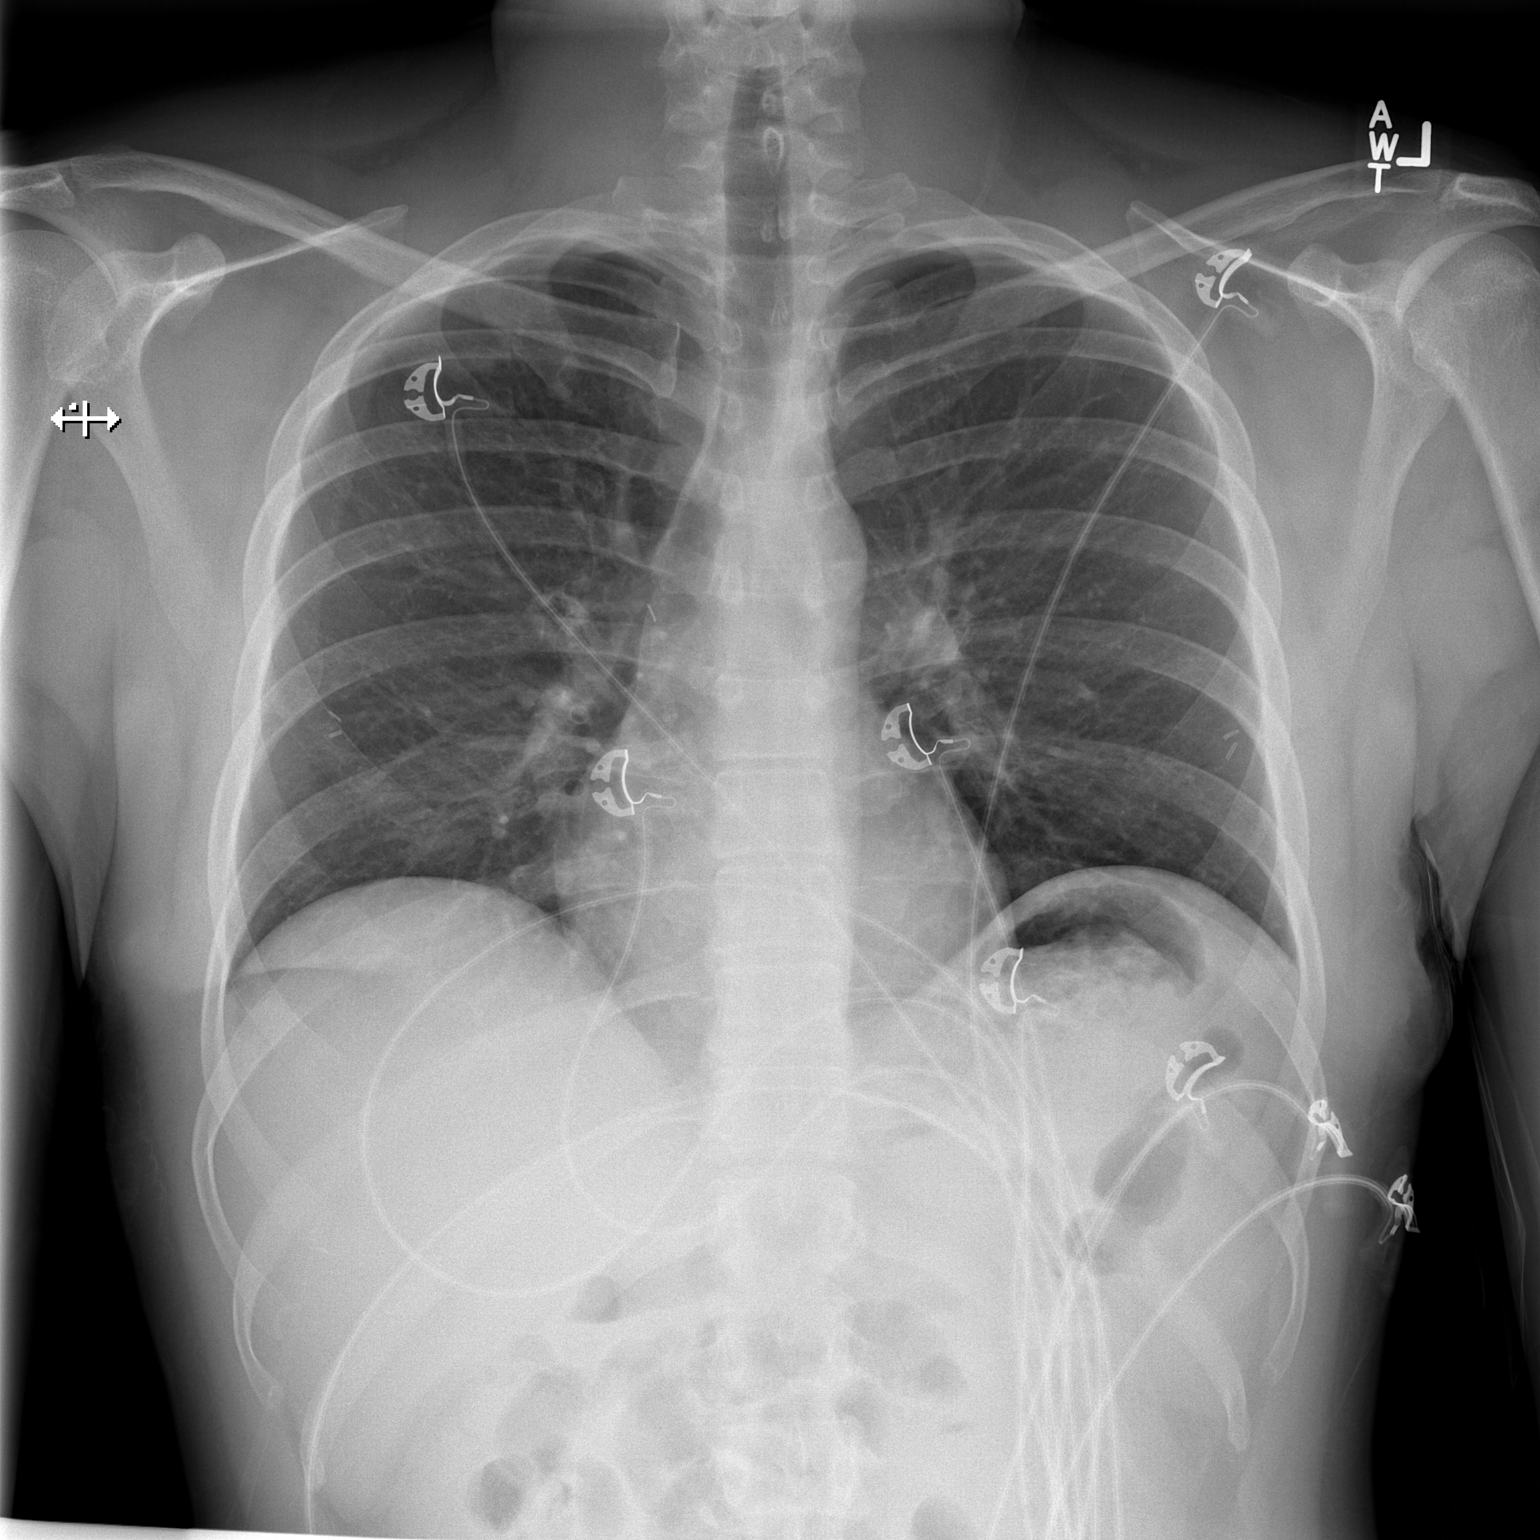

[w chest lat]
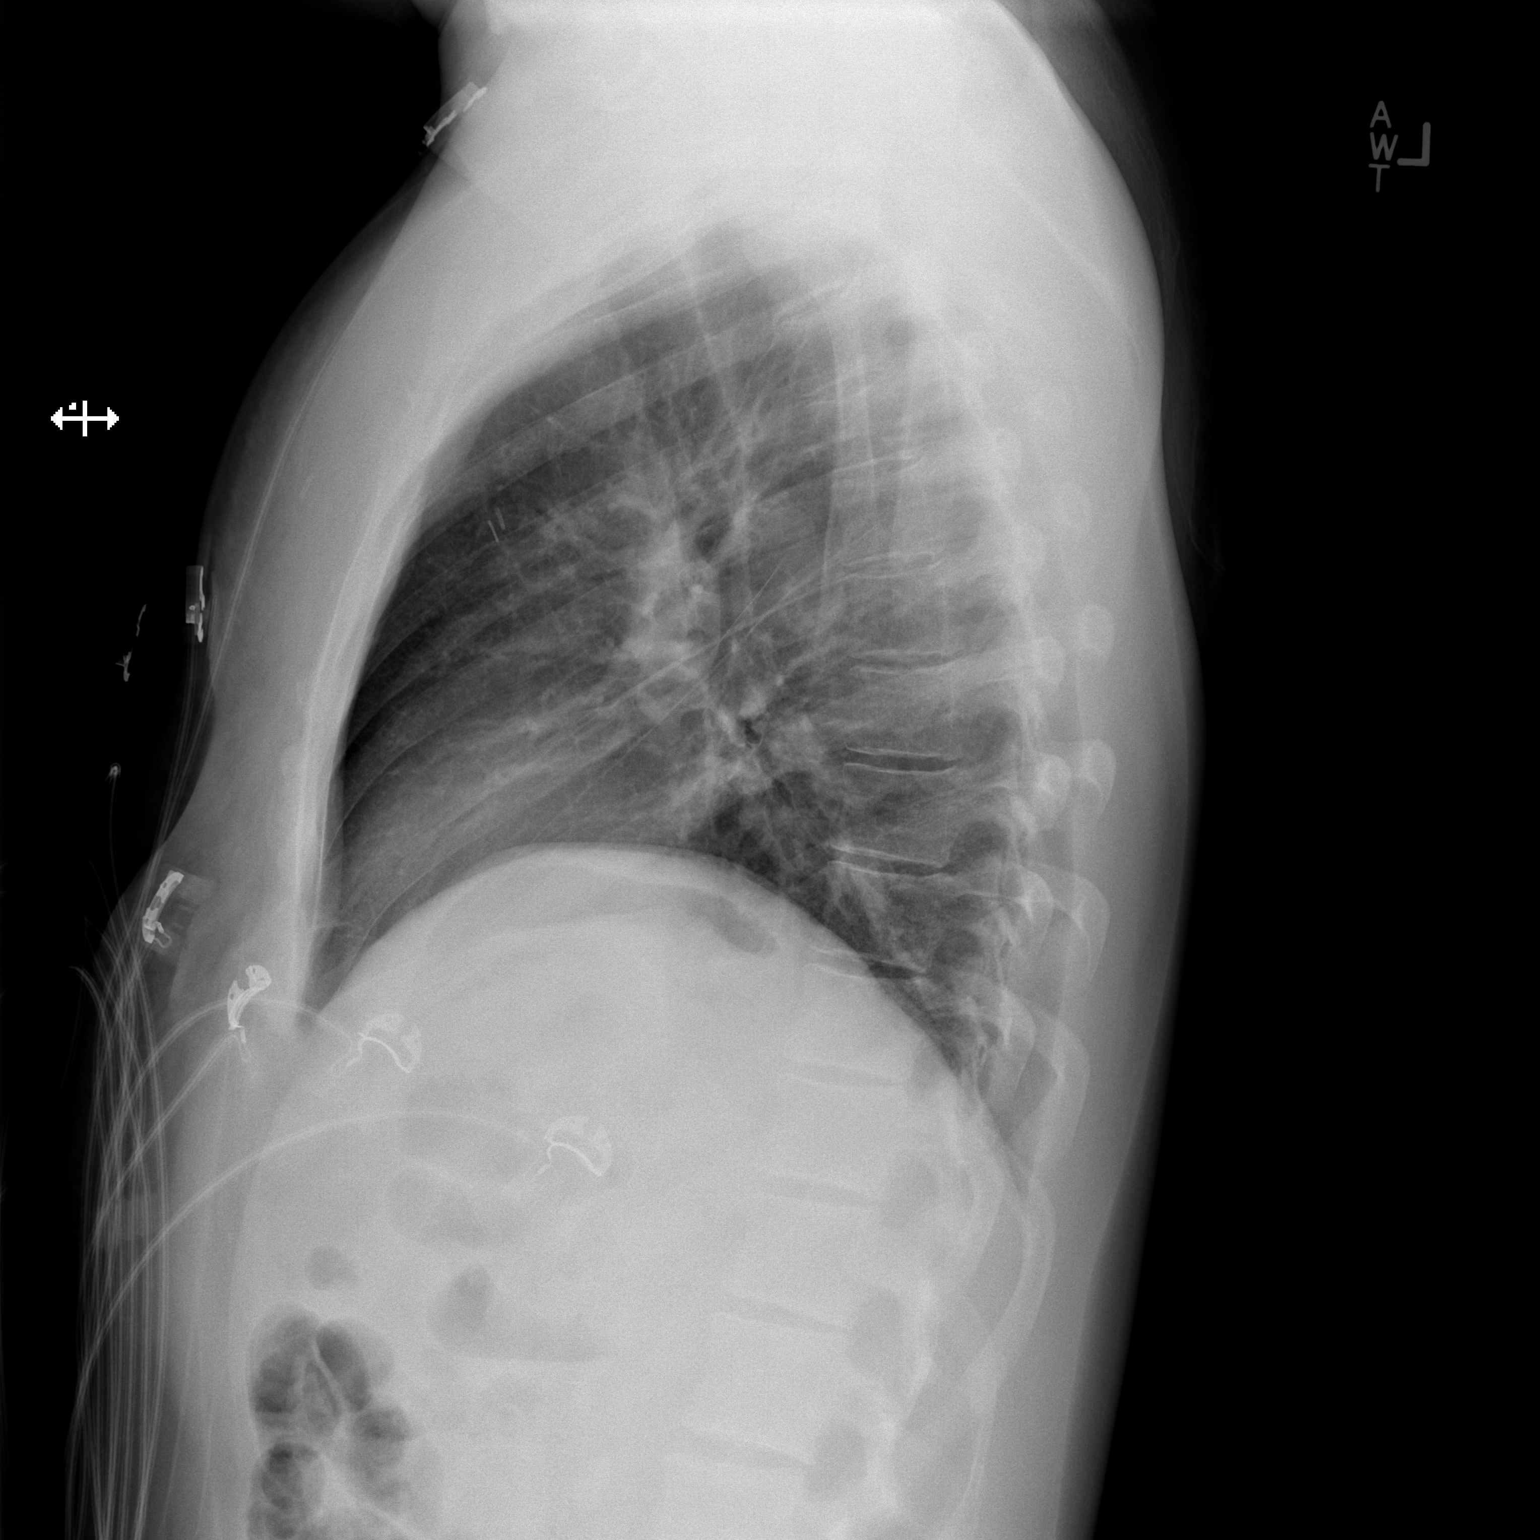

[2 of 2 positions shown; findings below may reference images not displayed]

FINDINGS: There are surgical clips over both breasts with apparent
mastectomies bilaterally. The lungs are clear. Heart size and
pulmonary vascularity are normal. No adenopathy. No bone lesions. No
pneumothorax. .
IMPRESSION: Apparent previous mastectomies bilaterally. Lungs clear. No
adenopathy.

## 2015-09-23 ENCOUNTER — Encounter (HOSPITAL_COMMUNITY): Payer: Self-pay

## 2015-09-23 ENCOUNTER — Inpatient Hospital Stay (HOSPITAL_COMMUNITY)
Admission: EM | Admit: 2015-09-23 | Discharge: 2015-09-26 | DRG: 897 | Disposition: A | Discharge status: Home / Self Care | Payer: No Typology Code available for payment source | Attending: Internal Medicine | Admitting: Internal Medicine

## 2015-09-23 DIAGNOSIS — F1020 Alcohol dependence, uncomplicated: Secondary | ICD-10-CM

## 2015-09-23 DIAGNOSIS — F10231 Alcohol dependence with withdrawal delirium: Principal | ICD-10-CM | POA: Diagnosis present

## 2015-09-23 DIAGNOSIS — E440 Moderate protein-calorie malnutrition: Secondary | ICD-10-CM | POA: Insufficient documentation

## 2015-09-23 DIAGNOSIS — Z905 Acquired absence of kidney: Secondary | ICD-10-CM

## 2015-09-23 DIAGNOSIS — Z880 Allergy status to penicillin: Secondary | ICD-10-CM

## 2015-09-23 DIAGNOSIS — F10939 Alcohol use, unspecified with withdrawal, unspecified: Secondary | ICD-10-CM | POA: Diagnosis present

## 2015-09-23 DIAGNOSIS — I2510 Atherosclerotic heart disease of native coronary artery without angina pectoris: Secondary | ICD-10-CM | POA: Diagnosis present

## 2015-09-23 DIAGNOSIS — E7800 Pure hypercholesterolemia, unspecified: Secondary | ICD-10-CM | POA: Diagnosis present

## 2015-09-23 DIAGNOSIS — F10239 Alcohol dependence with withdrawal, unspecified: Secondary | ICD-10-CM | POA: Diagnosis present

## 2015-09-23 DIAGNOSIS — K7460 Unspecified cirrhosis of liver: Secondary | ICD-10-CM | POA: Diagnosis present

## 2015-09-23 DIAGNOSIS — Z79891 Long term (current) use of opiate analgesic: Secondary | ICD-10-CM

## 2015-09-23 DIAGNOSIS — R109 Unspecified abdominal pain: Secondary | ICD-10-CM | POA: Diagnosis present

## 2015-09-23 DIAGNOSIS — Z853 Personal history of malignant neoplasm of breast: Secondary | ICD-10-CM

## 2015-09-23 DIAGNOSIS — E876 Hypokalemia: Secondary | ICD-10-CM | POA: Diagnosis present

## 2015-09-23 DIAGNOSIS — R112 Nausea with vomiting, unspecified: Secondary | ICD-10-CM | POA: Diagnosis present

## 2015-09-23 DIAGNOSIS — F17210 Nicotine dependence, cigarettes, uncomplicated: Secondary | ICD-10-CM | POA: Diagnosis present

## 2015-09-23 DIAGNOSIS — F1024 Alcohol dependence with alcohol-induced mood disorder: Secondary | ICD-10-CM | POA: Diagnosis present

## 2015-09-23 DIAGNOSIS — Z85528 Personal history of other malignant neoplasm of kidney: Secondary | ICD-10-CM

## 2015-09-23 DIAGNOSIS — Z888 Allergy status to other drugs, medicaments and biological substances status: Secondary | ICD-10-CM

## 2015-09-23 DIAGNOSIS — I10 Essential (primary) hypertension: Secondary | ICD-10-CM | POA: Diagnosis present

## 2015-09-23 DIAGNOSIS — F209 Schizophrenia, unspecified: Secondary | ICD-10-CM | POA: Diagnosis present

## 2015-09-23 DIAGNOSIS — R569 Unspecified convulsions: Secondary | ICD-10-CM | POA: Diagnosis present

## 2015-09-23 DIAGNOSIS — F1910 Other psychoactive substance abuse, uncomplicated: Secondary | ICD-10-CM | POA: Diagnosis present

## 2015-09-23 DIAGNOSIS — Z79899 Other long term (current) drug therapy: Secondary | ICD-10-CM

## 2015-09-23 DIAGNOSIS — I739 Peripheral vascular disease, unspecified: Secondary | ICD-10-CM | POA: Diagnosis present

## 2015-09-23 DIAGNOSIS — Z885 Allergy status to narcotic agent status: Secondary | ICD-10-CM

## 2015-09-23 DIAGNOSIS — F319 Bipolar disorder, unspecified: Secondary | ICD-10-CM | POA: Diagnosis present

## 2015-09-23 LAB — CBC
HEMATOCRIT: 45.9 % (ref 39.0–52.0)
HEMOGLOBIN: 15.9 g/dL (ref 13.0–17.0)
MCH: 32.3 pg (ref 26.0–34.0)
MCHC: 34.6 g/dL (ref 30.0–36.0)
MCV: 93.1 fL (ref 78.0–100.0)
Platelets: 190 10*3/uL (ref 150–400)
RBC: 4.93 MIL/uL (ref 4.22–5.81)
RDW: 14.5 % (ref 11.5–15.5)
WBC: 6.1 10*3/uL (ref 4.0–10.5)

## 2015-09-23 LAB — COMPREHENSIVE METABOLIC PANEL
ALT: 83 U/L — AB (ref 17–63)
ANION GAP: 12 (ref 5–15)
AST: 123 U/L — ABNORMAL HIGH (ref 15–41)
Albumin: 4.1 g/dL (ref 3.5–5.0)
Alkaline Phosphatase: 45 U/L (ref 38–126)
BUN: 9 mg/dL (ref 6–20)
CHLORIDE: 106 mmol/L (ref 101–111)
CO2: 20 mmol/L — AB (ref 22–32)
CREATININE: 1.03 mg/dL (ref 0.61–1.24)
Calcium: 8.9 mg/dL (ref 8.9–10.3)
GFR calc non Af Amer: >60 mL/min (ref >60)
Glucose, Bld: 119 mg/dL — ABNORMAL HIGH (ref 65–99)
Potassium: 3.7 mmol/L (ref 3.5–5.1)
SODIUM: 138 mmol/L (ref 135–145)
Total Bilirubin: 0.6 mg/dL (ref 0.3–1.2)
Total Protein: 7.2 g/dL (ref 6.5–8.1)

## 2015-09-23 LAB — RAPID URINE DRUG SCREEN, HOSP PERFORMED
AMPHETAMINES: NOT DETECTED
BARBITURATES: NOT DETECTED
BENZODIAZEPINES: NOT DETECTED
Cocaine: POSITIVE — AB
Opiates: NOT DETECTED
Tetrahydrocannabinol: NOT DETECTED

## 2015-09-23 LAB — URINALYSIS, ROUTINE W REFLEX MICROSCOPIC
GLUCOSE, UA: NEGATIVE mg/dL
Hgb urine dipstick: NEGATIVE
Ketones, ur: 15 mg/dL — AB
Leukocytes, UA: NEGATIVE
Nitrite: NEGATIVE
PH: 6.5 (ref 5.0–8.0)
Protein, ur: 30 mg/dL — AB
SPECIFIC GRAVITY, URINE: 1.027 (ref 1.005–1.030)

## 2015-09-23 LAB — URINE MICROSCOPIC-ADD ON

## 2015-09-23 LAB — LIPASE, BLOOD: LIPASE: 63 U/L — AB (ref 11–51)

## 2015-09-23 LAB — ETHANOL: Alcohol, Ethyl (B): <5 mg/dL (ref <5)

## 2015-09-23 MED ORDER — ALUM & MAG HYDROXIDE-SIMETH 200-200-20 MG/5ML PO SUSP: 30.0000 mL | ORAL | Status: DC | PRN

## 2015-09-23 MED ORDER — LORAZEPAM 2 MG/ML IJ SOLN
1.0000 mg | Freq: Once | INTRAMUSCULAR | Status: AC
  Administered 2015-09-23: 1 mg via INTRAVENOUS
  Filled 2015-09-23: qty 1

## 2015-09-23 MED ORDER — LORAZEPAM 1 MG PO TABS: 0.0000 mg | ORAL_TABLET | Freq: Four times a day (QID) | ORAL | Status: DC

## 2015-09-23 MED ORDER — THIAMINE HCL 100 MG/ML IJ SOLN
100.0000 mg | Freq: Every day | INTRAMUSCULAR | Status: DC
  Filled 2015-09-23 (×3): qty 1

## 2015-09-23 MED ORDER — DULOXETINE HCL 30 MG PO CPEP: 30.0000 mg | ORAL_CAPSULE | Freq: Every day | ORAL | Status: DC

## 2015-09-23 MED ORDER — AMLODIPINE BESYLATE 5 MG PO TABS: 5.0000 mg | ORAL_TABLET | Freq: Every day | ORAL | Status: DC

## 2015-09-23 MED ORDER — LORAZEPAM 2 MG/ML IJ SOLN
1.0000 mg | Freq: Four times a day (QID) | INTRAMUSCULAR | Status: DC | PRN
  Administered 2015-09-24 – 2015-09-26 (×3): 1 mg via INTRAVENOUS
  Filled 2015-09-23 (×3): qty 1

## 2015-09-23 MED ORDER — LORAZEPAM 1 MG PO TABS: 0.0000 mg | ORAL_TABLET | Freq: Two times a day (BID) | ORAL | Status: DC

## 2015-09-23 MED ORDER — ATORVASTATIN CALCIUM 20 MG PO TABS: 25.0000 mg | ORAL_TABLET | ORAL | Status: DC

## 2015-09-23 MED ORDER — ENOXAPARIN SODIUM 40 MG/0.4ML ~~LOC~~ SOLN
40.0000 mg | Freq: Every day | SUBCUTANEOUS | Status: DC
  Administered 2015-09-24 – 2015-09-25 (×3): 40 mg via SUBCUTANEOUS
  Filled 2015-09-23 (×3): qty 0.4

## 2015-09-23 MED ORDER — ONDANSETRON HCL 4 MG/2ML IJ SOLN
4.0000 mg | Freq: Once | INTRAMUSCULAR | Status: AC
  Administered 2015-09-23: 4 mg via INTRAVENOUS
  Filled 2015-09-23: qty 2

## 2015-09-23 MED ORDER — ONDANSETRON 4 MG PO TBDP
4.0000 mg | ORAL_TABLET | Freq: Once | ORAL | Status: AC | PRN
Start: 2015-09-23 — End: 2015-09-23
  Administered 2015-09-23: 4 mg via ORAL
  Filled 2015-09-23: qty 1

## 2015-09-23 MED ORDER — VITAMIN B-1 100 MG PO TABS
100.0000 mg | ORAL_TABLET | Freq: Every day | ORAL | Status: DC
  Administered 2015-09-24 – 2015-09-26 (×3): 100 mg via ORAL
  Filled 2015-09-23 (×3): qty 1

## 2015-09-23 MED ORDER — FOLIC ACID 1 MG PO TABS
1.0000 mg | ORAL_TABLET | Freq: Every day | ORAL | Status: DC
  Administered 2015-09-24 – 2015-09-26 (×3): 1 mg via ORAL
  Filled 2015-09-23 (×3): qty 1

## 2015-09-23 MED ORDER — ADULT MULTIVITAMIN W/MINERALS CH
1.0000 | ORAL_TABLET | Freq: Every day | ORAL | Status: DC
  Administered 2015-09-24 – 2015-09-26 (×3): 1 via ORAL
  Filled 2015-09-23 (×3): qty 1

## 2015-09-23 MED ORDER — SODIUM CHLORIDE 0.9 % IV SOLN
INTRAVENOUS | Status: DC
  Administered 2015-09-24 – 2015-09-26 (×7): via INTRAVENOUS

## 2015-09-23 MED ORDER — METOPROLOL TARTRATE 25 MG PO TABS: 25.0000 mg | ORAL_TABLET | Freq: Two times a day (BID) | ORAL | Status: DC

## 2015-09-23 MED ORDER — LORAZEPAM 1 MG PO TABS
1.0000 mg | ORAL_TABLET | Freq: Four times a day (QID) | ORAL | Status: DC | PRN
  Administered 2015-09-24 – 2015-09-25 (×3): 1 mg via ORAL
  Filled 2015-09-23 (×3): qty 1

## 2015-09-23 MED ORDER — SODIUM CHLORIDE 0.9 % IV BOLUS (SEPSIS)
2000.0000 mL | Freq: Once | INTRAVENOUS | Status: AC
  Administered 2015-09-23: 2000 mL via INTRAVENOUS

## 2015-09-23 MED ORDER — TRAZODONE HCL 100 MG PO TABS: 100.0000 mg | ORAL_TABLET | Freq: Every day | ORAL | Status: DC

## 2015-09-23 MED ORDER — ARIPIPRAZOLE 10 MG PO TABS: 10.0000 mg | ORAL_TABLET | Freq: Every day | ORAL | Status: DC

## 2015-09-23 MED ORDER — ONDANSETRON HCL 4 MG PO TABS: 4.0000 mg | ORAL_TABLET | Freq: Three times a day (TID) | ORAL | Status: DC | PRN

## 2015-09-23 NOTE — ED Notes (Signed)
Report given to Celest on 4E

## 2015-09-23 NOTE — H&P (Signed)
[facility] History and Physical  Emanual Bienaime [id number] DOB: Jun 16, 1981 DOA: 09/23/2015  Referring physician: EDP PCP: [facility]   Chief Complaint: Seizure   HPI: [name] is a 35 y.o. male with h/o EtOH abuse, DTs with withdrawal in past.  Patient presents to ED after a witnessed seizure at home.  Seizure witnessed by mother.  He had last EtOH drink yesterday.  EtOH level is negative in ED.  Normally drinks 2 bottles of wine a day.  No hallucinations, symptoms persistent, no treatment PTA.  Review of Systems: Systems reviewed.  As above, otherwise negative  Past Medical History  Diagnosis Date  . Seizures ([place])   . Hypertension   . Depression   . Pancreatitis   . Liver cirrhosis ([place])   . Coronary artery disease   . Angina   . Shortness of breath   . Headache(784.0)   . Peripheral vascular disease [facility]) April 2011    Left Pop  . Hypercholesteremia   . Schizophrenia ([place])   . Bipolar 1 disorder ([place])   . Cancer of kidney ([place]) dx'd 04/2013    lt nephrectomy  . Breast CA ([place]) dx'd 2009    bil w/ bil masectomy and oral meds  . H/O suicide attempt 2015    overdose   Past Surgical History  Procedure Laterality Date  . Chest surgery    . Left leg surgery    . Mastectomy    . Left kidney removal    . Breast surgery     Social History:  reports that he has been smoking.  He does not have any smokeless tobacco history on file. He reports that he drinks alcohol. He reports that he does not use illicit drugs.  Allergies  Allergen Reactions  . Codeine Hives, Itching and Swelling  . Penicillins Swelling    Has patient had a PCN reaction causing immediate rash, facial/tongue/throat swelling, SOB or lightheadedness with hypotension: Yes Has patient had a PCN reaction causing severe rash involving mucus membranes or skin necrosis: Yes Has patient had a PCN reaction that required hospitalization Yes-ed visit Has patient had a PCN  reaction occurring within the last 10 years: Yes If all of the above answers are "NO", then may proceed with Cephalosporin use.   . Depakote [Divalproex Sodium] Hives and Other (See Comments)    "Bug out and hallucinate"  . Morphine Itching  . Depakote Er [Divalproex Sodium Er] Hives  . Oxycodone Itching and Swelling  . Hydrocodone-Acetaminophen Itching and Rash    Family History  Problem Relation Age of Onset  . Stroke Other   . Cancer Other   . Hyperlipidemia Mother   . Hypertension Mother      Prior to Admission medications   Medication Sig Start Date End Date Taking? Authorizing Provider  amLODipine (NORVASC) 5 MG tablet Take 1 tablet (5 mg total) by mouth daily. 07/17/15  Yes Jolanta B Pucilowska, MD  ARIPiprazole (ABILIFY) 10 MG tablet Take 1 tablet (10 mg total) by mouth daily. 07/17/15  Yes Jolanta B Pucilowska, MD  atorvastatin (LIPITOR) 20 MG tablet Take 1.5 tablets (30 mg total) by mouth every morning. 07/17/15  Yes Jolanta B Pucilowska, MD  DULoxetine (CYMBALTA) 30 MG capsule Take 1 capsule (30 mg total) by mouth daily. 07/17/15  Yes Jolanta B Pucilowska, MD  metoprolol tartrate (LOPRESSOR) 25 MG tablet Take 1 tablet (25 mg total) by mouth 2 (two) times daily. 07/17/15  Yes Clovis Fredrickson, MD  traZODone (DESYREL) 100 MG tablet Take 1 tablet (100 mg total) by mouth at bedtime. 07/17/15  Yes Clovis Fredrickson, MD   Physical Exam: Filed Vitals:   09/23/15 2226 09/23/15 2245  BP: 156/119   Pulse: 88   Temp: 97.9 F (36.6 C)   Resp: 18 16    BP 156/119 mmHg  Pulse 88  Temp(Src) 97.9 F (36.6 C) (Oral)  Resp 16  SpO2 100%  General Appearance:    Alert, oriented, no distress, appears stated age  Head:    Normocephalic, atraumatic  Eyes:    PERRL, EOMI, sclera non-icteric        Nose:   Nares without drainage or epistaxis. Mucosa, turbinates normal  Throat:   Moist mucous membranes. Oropharynx without erythema or exudate.  Neck:   Supple. No carotid  bruits.  No thyromegaly.  No lymphadenopathy.   Back:     No CVA tenderness, no spinal tenderness  Lungs:     Clear to auscultation bilaterally, without wheezes, rhonchi or rales  Chest wall:    No tenderness to palpitation  Heart:    Regular rate and rhythm without murmurs, gallops, rubs  Abdomen:     Soft, non-tender, nondistended, normal bowel sounds, no organomegaly  Genitalia:    deferred  Rectal:    deferred  Extremities:   No clubbing, cyanosis or edema.  Pulses:   2+ and symmetric all extremities  Skin:   Skin color, texture, turgor normal, no rashes or lesions  Lymph nodes:   Cervical, supraclavicular, and axillary nodes normal  Neurologic:   CNII-XII intact. Normal strength, sensation and reflexes      throughout    Labs on Admission:  Basic Metabolic Panel:  Recent Labs Lab 09/23/15 1643  NA 138  K 3.7  CL 106  CO2 20*  GLUCOSE 119*  BUN 9  CREATININE 1.03  CALCIUM 8.9   Liver Function Tests:  Recent Labs Lab 09/23/15 1643  AST 123*  ALT 83*  ALKPHOS 45  BILITOT 0.6  PROT 7.2  ALBUMIN 4.1    Recent Labs Lab 09/23/15 1643  LIPASE 63*   No results for input(s): AMMONIA in the last 168 hours. CBC:  Recent Labs Lab 09/23/15 1643  WBC 6.1  HGB 15.9  HCT 45.9  MCV 93.1  PLT 190   Cardiac Enzymes: No results for input(s): CKTOTAL, CKMB, CKMBINDEX, TROPONINI in the last 168 hours.  BNP (last 3 results) No results for input(s): PROBNP in the last 8760 hours. CBG: No results for input(s): GLUCAP in the last 168 hours.  Radiological Exams on Admission: No results found.  EKG: Independently reviewed.   Assessment/Plan Principal Problem:   Alcohol withdrawal ([name]) Active Problems:   Alcohol use disorder, severe, dependence ([place])   1. Alcohol withdrawal - 1. CIWA protocol 2. Tele monitor    Code Status: Full  Family Communication: No family in room Disposition Plan: Admit to obs   Time spent: 30 min  GARDNER, JARED M. [facility] Pager [phone]  If 7AM-7PM, please contact the day team taking care of the patient [email or web address] Password [email or web address] 09/23/2015, 11:30 PM

## 2015-09-23 NOTE — ED Provider Notes (Signed)
CSN: [id number]     Arrival date & time 09/23/15  1447 History   First MD Initiated Contact with Patient 09/23/15 2042     Chief Complaint  Patient presents with  . Emesis  . Headache     (Consider location/radiation/quality/duration/timing/severity/associated sxs/prior Treatment) HPI Comments: Patient here stating he had alcohol withdrawal seizure prior to arrival and is now experiencing withdrawal symptoms consisting of tremors with nausea and vomiting. Denies abdominal pain. No black stools. No homicidal or suicidal ideations. States that he normally drinks 2 bottles of wine a day and his last drink was yesterday evening. Denies any illicit drug use. No fever or chills. No tactile hallucinations. Symptoms have been persistent and nothing makes them better worse. No treatment use prior to arrival  Patient is a 35 y.o. male presenting with vomiting and headaches. The history is provided by the patient.  Emesis Associated symptoms: headaches   Headache Associated symptoms: vomiting     Past Medical History  Diagnosis Date  . Seizures ([place])   . Hypertension   . Depression   . Pancreatitis   . Liver cirrhosis ([place])   . Coronary artery disease   . Angina   . Shortness of breath   . Headache(784.0)   . Peripheral vascular disease [facility]) April 2011    Left Pop  . Hypercholesteremia   . Schizophrenia ([place])   . Bipolar 1 disorder ([place])   . Cancer of kidney ([place]) dx'd 04/2013    lt nephrectomy  . Breast CA ([place]) dx'd 2009    bil w/ bil masectomy and oral meds  . H/O suicide attempt 2015    overdose   Past Surgical History  Procedure Laterality Date  . Chest surgery    . Left leg surgery    . Mastectomy    . Left kidney removal    . Breast surgery     Family History  Problem Relation Age of Onset  . Stroke Other   . Cancer Other   . Hyperlipidemia Mother   . Hypertension Mother    Social History  Substance Use Topics  . Smoking status: Current Every Day Smoker  .  Smokeless tobacco: None  . Alcohol Use: 0.0 oz/week    Review of Systems  Gastrointestinal: Positive for vomiting.  Neurological: Positive for headaches.  All other systems reviewed and are negative.     Allergies  Codeine; Penicillins; Depakote; Morphine; Depakote er; Oxycodone; and Hydrocodone-acetaminophen  Home Medications   Prior to Admission medications   Medication Sig Start Date End Date Taking? Authorizing Provider  amLODipine (NORVASC) 5 MG tablet Take 1 tablet (5 mg total) by mouth daily. 07/17/15  Yes Jolanta B Pucilowska, MD  ARIPiprazole (ABILIFY) 10 MG tablet Take 1 tablet (10 mg total) by mouth daily. 07/17/15  Yes Jolanta B Pucilowska, MD  atorvastatin (LIPITOR) 20 MG tablet Take 1.5 tablets (30 mg total) by mouth every morning. 07/17/15  Yes Jolanta B Pucilowska, MD  DULoxetine (CYMBALTA) 30 MG capsule Take 1 capsule (30 mg total) by mouth daily. 07/17/15  Yes Jolanta B Pucilowska, MD  metoprolol tartrate (LOPRESSOR) 25 MG tablet Take 1 tablet (25 mg total) by mouth 2 (two) times daily. 07/17/15  Yes Clovis Fredrickson, MD  traZODone (DESYREL) 100 MG tablet Take 1 tablet (100 mg total) by mouth at bedtime. 07/17/15  Yes Jolanta B Pucilowska, MD   BP 151/107 mmHg  Pulse 81  Temp(Src) 97.7 F (36.5 C) (Oral)  Resp 18  SpO2 98% Physical  Exam  Constitutional: He is oriented to person, place, and time. He appears well-developed and well-nourished.  Non-toxic appearance. No distress.  HENT:  Head: Normocephalic and atraumatic.  Eyes: Conjunctivae, EOM and lids are normal. Pupils are equal, round, and reactive to light.  Neck: Normal range of motion. Neck supple. No tracheal deviation present. No thyroid mass present.  Cardiovascular: Normal rate, regular rhythm and normal heart sounds.  Exam reveals no gallop.   No murmur heard. Pulmonary/Chest: Effort normal and breath sounds normal. No stridor. No respiratory distress. He has no decreased breath sounds. He has  no wheezes. He has no rhonchi. He has no rales.  Abdominal: Soft. Normal appearance and bowel sounds are normal. He exhibits no distension. There is no tenderness. There is no rebound and no CVA tenderness.  Musculoskeletal: Normal range of motion. He exhibits no edema or tenderness.  Neurological: He is alert and oriented to person, place, and time. He has normal strength. No cranial nerve deficit or sensory deficit. GCS eye subscore is 4. GCS verbal subscore is 5. GCS motor subscore is 6.  Skin: Skin is warm and dry. No abrasion and no rash noted.  Psychiatric: His speech is normal. His affect is blunt. He is slowed. He expresses no suicidal plans and no homicidal plans.  Nursing note and vitals reviewed.   ED Course  Procedures (including critical care time) Labs Review Labs Reviewed  LIPASE, BLOOD - Abnormal; Notable for the following:    Lipase 63 (*)    All other components within normal limits  COMPREHENSIVE METABOLIC PANEL - Abnormal; Notable for the following:    CO2 20 (*)    Glucose, Bld 119 (*)    AST 123 (*)    ALT 83 (*)    All other components within normal limits  CBC  URINALYSIS, ROUTINE W REFLEX MICROSCOPIC (NOT AT [facility])  ETHANOL  URINE RAPID DRUG SCREEN, HOSP PERFORMED    Imaging Review No results found. I have personally reviewed and evaluated these images and lab results as part of my medical decision-making.   EKG Interpretation   Date/Time:  Tuesday September 23 2015 17:58:18 EST Ventricular Rate:  72 PR Interval:  139 QRS Duration: 101 QT Interval:  467 QTC Calculation: 511 R Axis:   76 Text Interpretation:  Sinus rhythm Prolonged QT interval Confirmed by  Zenia Resides  MD, Christyna Letendre ([id number]) on 09/23/2015 8:43:11 PM      MDM   Final diagnoses:  None    Patient given Ativan and because of his history of on-call to all seizure will be admitted for observation    Lacretia Leigh, MD 09/23/15 2243

## 2015-09-23 NOTE — ED Notes (Signed)
Bed: [facility]ALD Expected date:  Expected time:  Means of arrival:  Comments: 

## 2015-09-23 NOTE — ED Notes (Signed)
Pt states that he is detoxing from ETOH Last drink was last night around 2230hrs Pt did not inform nurse during initial triage assessment Pt actively vomiting Pt now c/o of mid-sternal chest pain EKG will be performed per Standing Orders

## 2015-09-23 NOTE — ED Notes (Signed)
RN KRISTEN AT [place]

## 2015-09-23 NOTE — ED Notes (Signed)
Pt with nausea/vomiting/diarrhea since 5 am.  Abdominal pain.

## 2015-09-24 ENCOUNTER — Encounter (HOSPITAL_COMMUNITY): Payer: Self-pay

## 2015-09-24 DIAGNOSIS — F1020 Alcohol dependence, uncomplicated: Secondary | ICD-10-CM

## 2015-09-24 DIAGNOSIS — F1910 Other psychoactive substance abuse, uncomplicated: Secondary | ICD-10-CM

## 2015-09-24 DIAGNOSIS — R569 Unspecified convulsions: Secondary | ICD-10-CM

## 2015-09-24 DIAGNOSIS — F10239 Alcohol dependence with withdrawal, unspecified: Secondary | ICD-10-CM

## 2015-09-24 DIAGNOSIS — E440 Moderate protein-calorie malnutrition: Secondary | ICD-10-CM

## 2015-09-24 LAB — MRSA PCR SCREENING: MRSA by PCR: NEGATIVE

## 2015-09-24 MED ORDER — ENSURE ENLIVE PO LIQD
237.0000 mL | Freq: Two times a day (BID) | ORAL | Status: DC
  Administered 2015-09-24 – 2015-09-26 (×3): 237 mL via ORAL

## 2015-09-24 MED ORDER — ACETAMINOPHEN 325 MG PO TABS: 650.0000 mg | ORAL_TABLET | ORAL | Status: DC | PRN

## 2015-09-24 MED ORDER — IBUPROFEN 800 MG PO TABS
800.0000 mg | ORAL_TABLET | Freq: Four times a day (QID) | ORAL | Status: DC | PRN
  Administered 2015-09-24 – 2015-09-26 (×2): 800 mg via ORAL
  Filled 2015-09-24 (×2): qty 1

## 2015-09-24 NOTE — Progress Notes (Signed)
Initial Nutrition Assessment  DOCUMENTATION CODES:   Non-severe (moderate) malnutrition in context of acute illness/injury  INTERVENTION:  - Continue Ensure Enlive po BID, each supplement provides 350 kcal and 20 grams of protein - RD will continue to monitor for additional needs  NUTRITION DIAGNOSIS:   Inadequate oral intake related to social / environmental circumstances, acute illness as evidenced by per patient/family report.  GOAL:   Patient will meet greater than or equal to 90% of their needs  MONITOR:   PO intake, Supplement acceptance, Weight trends, Labs, I & O's  REASON FOR ASSESSMENT:   Malnutrition Screening Tool  ASSESSMENT:   35 y.o. male with h/o EtOH abuse, DTs with withdrawal in past. Patient presents to ED after a witnessed seizure at home. Seizure witnessed by mother. He had last EtOH drink yesterday. EtOH level is negative in ED. Normally drinks 2 bottles of wine a day. No hallucinations, symptoms persistent, no treatment PTA.  Pt seen for MST. BMI indicates normal weight. Per chart review, pt ate 50% of breakfast this AM which he reports was [place] toast, eggs, sausage, and yogurt. Pt states that nausea began yesterday (3/7) and continues currently; was not worsened by breakfast intake. He denies emesis. PTA pt's appetite was fair and he typically grazed throughout the day and did not eat set meals.   He denies chewing or swallowing issues and denies ever having nausea or abdominal pain with intakes PTA. Notes indicate that pt drinks 2 bottles of wine/day. He states that over the past 2 months he has lost 10 lbs (6% body weight) which is significant for time frame. Mild muscle and fat wasting noted during physical assessment.   Encouraged pt to continue eating as he is able during meals. Ensure Enlive already ordered BID; will monitor for additional nutrition-related needs. Not meeting needs at this time. Medications reviewed; vitamin B1 and folic acid  ordered. IVF: NS @ 125 mL/hr. Labs reviewed; lipase: 63 units/L, AST/ALT elevated.   Diet Order:  Diet regular Room service appropriate?: Yes; Fluid consistency:: Thin  Skin:  Reviewed, no issues  Last BM:  3/7  Height:   Ht Readings from Last 1 Encounters:  09/23/15 5\' 6"  (1.676 m)    Weight:   Wt Readings from Last 1 Encounters:  09/23/15 151 lb 11.2 oz (68.811 kg)    Ideal Body Weight:  64.54 kg (kg)  BMI:  Body mass index is 24.5 kg/(m^2).  Estimated Nutritional Needs:   Kcal:  [id number]  Protein:  70-80 grams  Fluid:  2 L/day  EDUCATION NEEDS:   No education needs identified at this time     Jarome Matin, RD, LDN Inpatient Clinical Dietitian Pager # [phone] After hours/weekend pager # [phone]

## 2015-09-24 NOTE — Care Management Note (Signed)
Case Management Note  Patient Details  Name: Mustafe Portocarrero MRN: [id number] Date of Birth: 05-19-1981  Subjective/Objective: 35 y/o m admitted w/ETOH w/drawal. From home.                   Action/Plan:d/c home.   Expected Discharge Date:                 Expected Discharge Plan:  Home/Self Care  In-House Referral:     Discharge planning Services  CM Consult  Post Acute Care Choice:    Choice offered to:     DME Arranged:    DME Agency:     HH Arranged:    HH Agency:     Status of Service:  In process, will continue to follow  Medicare Important Message Given:    Date Medicare IM Given:    Medicare IM give by:    Date Additional Medicare IM Given:    Additional Medicare Important Message give by:     If discussed at [place] of Stay Meetings, dates discussed:    Additional Comments:  Dessa Phi, RN 09/24/2015, 2:53 PM

## 2015-09-24 NOTE — Progress Notes (Signed)
[facility]                                                                              Patient Demographics  [name], is a 35 y.o. male, DOB - Dec 02, 1980, [id number]  Admit date - 09/23/2015   Admitting Physician Etta Quill, DO  Outpatient Primary MD for the patient is [place]  LOS -    Chief Complaint  Patient presents with  . Emesis  . Headache      HPI on 09/23/2015 by Dr. Jennette Kettle [name] is a 34 y.o. male with h/o EtOH abuse, DTs with withdrawal in past. Patient presents to ED after a witnessed seizure at home. Seizure witnessed by mother. He had last EtOH drink yesterday. EtOH level is negative in ED. Normally drinks 2 bottles of wine a day. No hallucinations, symptoms persistent, no treatment PTA.  Assessment & Plan   Alcohol withdrawal -Blood alcohol level <5 -Patient states he drinks 2 bottles of wine daily -Continue CIWA protocol -Consulted SW  Seizure -Secondary to alcohol withdrawal -Continue seizure precautions and CIWA protocol  Polysubstance abuse -tox screen +cocaine -patient counseled  Code Status: Full  Family Communication: None at bedside  Disposition Plan: Admitted. Likely discharge within 24-48hours.   Time Spent in minutes   30 minutes  Procedures  None  Consults   None  DVT Prophylaxis  lovenox  Lab Results  Component Value Date   PLT 190 09/23/2015    Medications  Scheduled Meds: . enoxaparin (LOVENOX) injection  40 mg Subcutaneous QHS  . feeding supplement (ENSURE ENLIVE)  237 mL Oral BID BM  . folic acid  1 mg Oral Daily  . multivitamin with minerals  1 tablet Oral Daily  . thiamine  100 mg Oral Daily   Or  . thiamine  100 mg Intravenous Daily   Continuous Infusions: . sodium chloride 125 mL/hr at 09/24/15 0839   PRN Meds:.acetaminophen, ibuprofen, LORazepam **OR** LORazepam  Antibiotics    Anti-infectives    None      Subjective:   [name] seen and examined today.  Denies further "shakiness", nausea or vomiting.  Denies chest pain, shortness of breath, abdominal pain.  Feels some soreness.   Objective:   Filed Vitals:   09/23/15 2226 09/23/15 2245 09/23/15 2355 09/24/15 0605  BP: 156/119  143/100 133/89  Pulse: 88  101 106  Temp: 97.9 F (36.6 C)  98.4 F (36.9 C) 98.8 F (37.1 C)  TempSrc: Oral  Oral Oral  Resp: 18 16 16 18   Height:   5\' 6"  (1.676 m)   Weight:   68.811 kg (151 lb 11.2 oz)   SpO2: 100%  100% 99%    Wt Readings from Last 3 Encounters:  09/23/15 68.811 kg (151 lb 11.2 oz)  07/16/15 70.761 kg (156 lb)  07/16/15 71.124 kg (156 lb 12.8 oz)     Intake/Output Summary (Last 24 hours) at 09/24/15 1427 Last data filed at 09/24/15 1018  Gross per 24 hour  Intake 1067.08 ml  Output    475 ml  Net 592.08 ml    Exam  General: Well developed, well nourished,  NAD, appears stated age  79: NCAT,mucous membranes moist.   Cardiovascular: S1 S2 auscultated, no murmurs, RRR  Respiratory: Clear to auscultation bilaterally  Abdomen: Soft, nontender, nondistended, + bowel sounds  Extremities: warm dry without cyanosis clubbing or edema  Neuro: AAOx3, nonfocal  Psych: Normal affect and demeanor   Data Review   Micro Results Recent Results (from the past 240 hour(s))  MRSA PCR Screening     Status: None   Collection Time: 09/24/15  1:05 AM  Result Value Ref Range Status   MRSA by PCR NEGATIVE NEGATIVE Final    Comment:        The GeneXpert MRSA Assay (FDA approved for NASAL specimens only), is one component of a comprehensive MRSA colonization surveillance program. It is not intended to diagnose MRSA infection nor to guide or monitor treatment for MRSA infections.     Radiology Reports No results found.  CBC  Recent Labs Lab 09/23/15 1643  WBC 6.1  HGB 15.9  HCT 45.9  PLT 190  MCV 93.1  MCH 32.3  MCHC 34.6  RDW 14.5    Chemistries   Recent Labs Lab  09/23/15 1643  NA 138  K 3.7  CL 106  CO2 20*  GLUCOSE 119*  BUN 9  CREATININE 1.03  CALCIUM 8.9  AST 123*  ALT 83*  ALKPHOS 45  BILITOT 0.6   ------------------------------------------------------------------------------------------------------------------ estimated creatinine clearance is 89.5 mL/min (by C-G formula based on Cr of 1.03). ------------------------------------------------------------------------------------------------------------------ No results for input(s): HGBA1C in the last 72 hours. ------------------------------------------------------------------------------------------------------------------ No results for input(s): CHOL, HDL, LDLCALC, TRIG, CHOLHDL, LDLDIRECT in the last 72 hours. ------------------------------------------------------------------------------------------------------------------ No results for input(s): TSH, T4TOTAL, T3FREE, THYROIDAB in the last 72 hours.  Invalid input(s): FREET3 ------------------------------------------------------------------------------------------------------------------ No results for input(s): VITAMINB12, FOLATE, FERRITIN, TIBC, IRON, RETICCTPCT in the last 72 hours.  Coagulation profile No results for input(s): INR, PROTIME in the last 168 hours.  No results for input(s): DDIMER in the last 72 hours.  Cardiac Enzymes No results for input(s): CKMB, TROPONINI, MYOGLOBIN in the last 168 hours.  Invalid input(s): CK ------------------------------------------------------------------------------------------------------------------ Invalid input(s): POCBNP    Annibelle Brazie D.O. on 09/24/2015 at 2:27 PM  Between 7am to 7pm - Pager - [phone]  After 7pm go to [email or web address] - password [email or web address]  And look for the night coverage person covering for me after hours  [facility] Office  [phone]

## 2015-09-25 DIAGNOSIS — R110 Nausea: Secondary | ICD-10-CM

## 2015-09-25 LAB — BASIC METABOLIC PANEL
ANION GAP: 8 (ref 5–15)
BUN: 6 mg/dL (ref 6–20)
CHLORIDE: 105 mmol/L (ref 101–111)
CO2: 27 mmol/L (ref 22–32)
CREATININE: 0.91 mg/dL (ref 0.61–1.24)
Calcium: 8.4 mg/dL — ABNORMAL LOW (ref 8.9–10.3)
GFR calc non Af Amer: >60 mL/min (ref >60)
Glucose, Bld: 78 mg/dL (ref 65–99)
Potassium: 3.8 mmol/L (ref 3.5–5.1)
Sodium: 140 mmol/L (ref 135–145)

## 2015-09-25 LAB — CBC
HEMATOCRIT: 39.2 % (ref 39.0–52.0)
HEMOGLOBIN: 13.3 g/dL (ref 13.0–17.0)
MCH: 31.8 pg (ref 26.0–34.0)
MCHC: 33.9 g/dL (ref 30.0–36.0)
MCV: 93.8 fL (ref 78.0–100.0)
Platelets: 166 10*3/uL (ref 150–400)
RBC: 4.18 MIL/uL — ABNORMAL LOW (ref 4.22–5.81)
RDW: 13.8 % (ref 11.5–15.5)
WBC: 4.8 10*3/uL (ref 4.0–10.5)

## 2015-09-25 MED ORDER — ZOLPIDEM TARTRATE 5 MG PO TABS
5.0000 mg | ORAL_TABLET | Freq: Every evening | ORAL | Status: DC | PRN
  Administered 2015-09-25: 5 mg via ORAL
  Filled 2015-09-25: qty 1

## 2015-09-25 MED ORDER — TRAZODONE HCL 50 MG PO TABS
100.0000 mg | ORAL_TABLET | Freq: Every evening | ORAL | Status: DC | PRN
  Administered 2015-09-25: 100 mg via ORAL
  Filled 2015-09-25: qty 2

## 2015-09-25 MED ORDER — ONDANSETRON HCL 4 MG PO TABS
4.0000 mg | ORAL_TABLET | Freq: Once | ORAL | Status: AC
  Administered 2015-09-25: 4 mg via ORAL
  Filled 2015-09-25: qty 1

## 2015-09-25 NOTE — Progress Notes (Signed)
CSW met with patient to discuss ETOH abuse. Patient requested information on outpatient treatment options, informed CSW that he had been to [facility] in the past. CSW provided information on outpatient & residential treatment facilities & encouraged patient to [facility]. Patient understands that they have a walk-in appointments at [facility] also provided patient with AA meeting list.   No further CSW needs identified - CSW signing off.   Raynaldo Opitz, [facility] Clinical Social Worker cell #: [phone]

## 2015-09-25 NOTE — Progress Notes (Signed)
[facility]                                                                              Patient Demographics  [name], is a 35 y.o. male, DOB - Jun 19, 1981, [id number]  Admit date - 09/23/2015   Admitting Physician Etta Quill, DO  Outpatient Primary MD for the patient is [place]  LOS - 1   Chief Complaint  Patient presents with  . Emesis  . Headache      HPI on 09/23/2015 by Dr. Jennette Kettle [name] is a 35 y.o. male with h/o EtOH abuse, DTs with withdrawal in past. Patient presents to ED after a witnessed seizure at home. Seizure witnessed by mother. He had last EtOH drink yesterday. EtOH level is negative in ED. Normally drinks 2 bottles of wine a day. No hallucinations, symptoms persistent, no treatment PTA.  Assessment & Plan   Alcohol withdrawal -Blood alcohol level <5 -Patient states he drinks 2 bottles of wine daily -Continue CIWA protocol -Consulted SW to provide resources regarding alcoholism and drug abuse/detox -Patient continues to feel "shaky"  Seizure -Secondary to alcohol withdrawal -Continue seizure precautions and CIWA protocol  Polysubstance abuse -tox screen +cocaine -patient counseled  Nausea and abdominal pain -Likely secondary to the above.  -Will place on soft diet and contiue to monitor -antiemetics PRN  Code Status: Full  Family Communication: None at bedside  Disposition Plan: Admitted. Likely discharge within 24-48hours.   Time Spent in minutes   30 minutes  Procedures  None  Consults   None  DVT Prophylaxis  lovenox  Lab Results  Component Value Date   PLT 166 09/25/2015    Medications  Scheduled Meds: . enoxaparin (LOVENOX) injection  40 mg Subcutaneous QHS  . feeding supplement (ENSURE ENLIVE)  237 mL Oral BID BM  . folic acid  1 mg Oral Daily  . multivitamin with minerals  1 tablet Oral Daily  . thiamine  100 mg Oral Daily   Or  . thiamine  100 mg  Intravenous Daily   Continuous Infusions: . sodium chloride 125 mL/hr at 09/25/15 0044   PRN Meds:.acetaminophen, ibuprofen, LORazepam **OR** LORazepam, zolpidem  Antibiotics    Anti-infectives    None      Subjective:   [name] seen and examined today.  Continues to feel shaky.  Denies further vomiting, but has been nauseous and had poor appetite.  Patient does complain of mild abdominal pain. Denies chest pain, shortness of breath.  Feels some soreness.   Objective:   Filed Vitals:   09/24/15 0605 09/24/15 1432 09/24/15 2013 09/25/15 0533  BP: 133/89 129/94 132/93 135/92  Pulse: 106 87 89 94  Temp: 98.8 F (37.1 C) 98.6 F (37 C) 98 F (36.7 C) 98 F (36.7 C)  TempSrc: Oral Oral Oral Oral  Resp: 18 16 18 18   Height:      Weight:      SpO2: 99% 100% 100% 100%    Wt Readings from Last 3 Encounters:  09/23/15 68.811 kg (151 lb 11.2 oz)  07/16/15 70.761 kg (156 lb)  07/16/15 71.124 kg (156 lb 12.8 oz)  Intake/Output Summary (Last 24 hours) at 09/25/15 1246 Last data filed at 09/25/15 [id number]  Gross per 24 hour  Intake 3734.58 ml  Output   3850 ml  Net -115.42 ml    Exam  General: Well developed, well nourished, NAD  HEENT: NCAT,mucous membranes moist.   Cardiovascular: S1 S2 auscultated, no murmurs, RRR  Respiratory: Clear to auscultation  Abdomen: Soft, nontender, nondistended, + bowel sounds  Extremities: warm dry without cyanosis clubbing or edema  Neuro: AAOx3, nonfocal  Psych: Normal affect and demeanor   Data Review   Micro Results Recent Results (from the past 240 hour(s))  MRSA PCR Screening     Status: None   Collection Time: 09/24/15  1:05 AM  Result Value Ref Range Status   MRSA by PCR NEGATIVE NEGATIVE Final    Comment:        The GeneXpert MRSA Assay (FDA approved for NASAL specimens only), is one component of a comprehensive MRSA colonization surveillance program. It is not intended to diagnose MRSA infection nor to  guide or monitor treatment for MRSA infections.     Radiology Reports No results found.  CBC  Recent Labs Lab 09/23/15 1643 09/25/15 0454  WBC 6.1 4.8  HGB 15.9 13.3  HCT 45.9 39.2  PLT 190 166  MCV 93.1 93.8  MCH 32.3 31.8  MCHC 34.6 33.9  RDW 14.5 13.8    Chemistries   Recent Labs Lab 09/23/15 1643 09/25/15 0454  NA 138 140  K 3.7 3.8  CL 106 105  CO2 20* 27  GLUCOSE 119* 78  BUN 9 6  CREATININE 1.03 0.91  CALCIUM 8.9 8.4*  AST 123*  --   ALT 83*  --   ALKPHOS 45  --   BILITOT 0.6  --    ------------------------------------------------------------------------------------------------------------------ estimated creatinine clearance is 101.3 mL/min (by C-G formula based on Cr of 0.91). ------------------------------------------------------------------------------------------------------------------ No results for input(s): HGBA1C in the last 72 hours. ------------------------------------------------------------------------------------------------------------------ No results for input(s): CHOL, HDL, LDLCALC, TRIG, CHOLHDL, LDLDIRECT in the last 72 hours. ------------------------------------------------------------------------------------------------------------------ No results for input(s): TSH, T4TOTAL, T3FREE, THYROIDAB in the last 72 hours.  Invalid input(s): FREET3 ------------------------------------------------------------------------------------------------------------------ No results for input(s): VITAMINB12, FOLATE, FERRITIN, TIBC, IRON, RETICCTPCT in the last 72 hours.  Coagulation profile No results for input(s): INR, PROTIME in the last 168 hours.  No results for input(s): DDIMER in the last 72 hours.  Cardiac Enzymes No results for input(s): CKMB, TROPONINI, MYOGLOBIN in the last 168 hours.  Invalid input(s): CK ------------------------------------------------------------------------------------------------------------------ Invalid  input(s): POCBNP    Tayonna Bacha D.O. on 09/25/2015 at 12:46 PM  Between 7am to 7pm - Pager - [phone]  After 7pm go to [email or web address] - password [email or web address]  And look for the night coverage person covering for me after hours  [facility] Office  [phone]

## 2015-09-26 DIAGNOSIS — E876 Hypokalemia: Secondary | ICD-10-CM

## 2015-09-26 LAB — BASIC METABOLIC PANEL
Anion gap: 7 (ref 5–15)
BUN: 6 mg/dL (ref 6–20)
CHLORIDE: 101 mmol/L (ref 101–111)
CO2: 23 mmol/L (ref 22–32)
CREATININE: 0.77 mg/dL (ref 0.61–1.24)
Calcium: 8.1 mg/dL — ABNORMAL LOW (ref 8.9–10.3)
GFR calc Af Amer: >60 mL/min (ref >60)
GFR calc non Af Amer: >60 mL/min (ref >60)
GLUCOSE: 82 mg/dL (ref 65–99)
Potassium: 3.1 mmol/L — ABNORMAL LOW (ref 3.5–5.1)
SODIUM: 139 mmol/L (ref 135–145)

## 2015-09-26 LAB — CBC
HCT: 37.8 % — ABNORMAL LOW (ref 39.0–52.0)
HEMOGLOBIN: 13.6 g/dL (ref 13.0–17.0)
MCH: 33.1 pg (ref 26.0–34.0)
MCHC: 36 g/dL (ref 30.0–36.0)
MCV: 92 fL (ref 78.0–100.0)
Platelets: 171 10*3/uL (ref 150–400)
RBC: 4.11 MIL/uL — ABNORMAL LOW (ref 4.22–5.81)
RDW: 13.8 % (ref 11.5–15.5)
WBC: 5 10*3/uL (ref 4.0–10.5)

## 2015-09-26 MED ORDER — ADULT MULTIVITAMIN W/MINERALS CH: 1.0000 | ORAL_TABLET | Freq: Every day | ORAL | Status: DC

## 2015-09-26 MED ORDER — THIAMINE HCL 100 MG PO TABS: 100.0000 mg | ORAL_TABLET | Freq: Every day | ORAL | Status: DC

## 2015-09-26 MED ORDER — POTASSIUM CHLORIDE CRYS ER 20 MEQ PO TBCR
40.0000 meq | EXTENDED_RELEASE_TABLET | Freq: Once | ORAL | Status: AC
  Administered 2015-09-26: 40 meq via ORAL
  Filled 2015-09-26: qty 2

## 2015-09-26 MED ORDER — ENSURE ENLIVE PO LIQD: 237.0000 mL | Freq: Two times a day (BID) | ORAL | Status: DC

## 2015-09-26 MED ORDER — FOLIC ACID 1 MG PO TABS: 1.0000 mg | ORAL_TABLET | Freq: Every day | ORAL | Status: DC

## 2015-09-26 NOTE — Discharge Summary (Signed)
Physician Discharge Summary  [name] [id number] DOB: Dec 06, 1980 DOA: 09/23/2015  PCP: [name] date: 09/23/2015 Discharge date: 09/26/2015  Time spent: 45 minutes  Recommendations for Outpatient Follow-up:  Patient will be discharged to home.  Patient will need to follow up with primary care provider within one week of discharge, repeat BMP.  Patient should continue medications as prescribed.  Patient should follow a soft diet, advance to regular diet as tolerated. Abstain from drug and alcohol use.   Discharge Diagnoses:  Alcohol withdrawal Seizure Polysubstance abuse Nausea and abdominal pain Hypokalemia  Discharge Condition: Stable  Diet recommendation: Regular  Filed Weights   09/23/15 2355  Weight: 68.811 kg (151 lb 11.2 oz)    History of present illness:  on 09/23/2015 by Dr. Jennette Kettle [name] is a 35 y.o. male with h/o EtOH abuse, DTs with withdrawal in past. Patient presents to ED after a witnessed seizure at home. Seizure witnessed by mother. He had last EtOH drink yesterday. EtOH level is negative in ED. Normally drinks 2 bottles of wine a day. No hallucinations, symptoms persistent, no treatment PTA.  Hospital Course:  Alcohol withdrawal -Blood alcohol level <5 -Patient states he drinks 2 bottles of wine daily -Was placed on CIWA protocol -Consulted SW to provide resources regarding alcoholism and drug abuse/detox -No DTs, no longer feeling "shaky"  Seizure -Secondary to alcohol withdrawal -Continue seizure precautions and CIWA protocol -Spoke with neurology via phone, no need for AED at this time.  Polysubstance abuse -tox screen +cocaine -patient counseled and given resources  Nausea and abdominal pain -Likely secondary to the above.  -Resolved, tolerated soft diet.  Hypokalemia -Replaced, repeat BMP in one week  Procedures  None  Consults  Neurology, Dr. Nicole Kindred, via hone  Discharge  Exam: Filed Vitals:   09/25/15 1954 09/26/15 0544  BP: 139/89 133/91  Pulse: 94 84  Temp: 98 F (36.7 C) 98.4 F (36.9 C)  Resp: 18 18   Exam  General: Well developed, well nourished, NAD  HEENT: NCAT,mucous membranes moist.   Cardiovascular: S1 S2 auscultated, no murmurs, RRR  Respiratory: Clear to auscultation bilaterally  Abdomen: Soft, nontender, nondistended, + bowel sounds  Extremities: warm dry without cyanosis clubbing or edema  Neuro: AAOx3, nonfocal  Psych: Normal affect and demeanor, pleasant  Discharge Instructions      Discharge Instructions    Discharge instructions    Complete by:  As directed   Patient will be discharged to home.  Patient will need to follow up with primary care provider within one week of discharge, repeat BMP.  Patient should continue medications as prescribed.  Patient should follow a soft diet, advance to regular diet as tolerated.            Medication List    TAKE these medications        amLODipine 5 MG tablet  Commonly known as:  NORVASC  Take 1 tablet (5 mg total) by mouth daily.     ARIPiprazole 10 MG tablet  Commonly known as:  ABILIFY  Take 1 tablet (10 mg total) by mouth daily.     atorvastatin 20 MG tablet  Commonly known as:  LIPITOR  Take 1.5 tablets (30 mg total) by mouth every morning.     DULoxetine 30 MG capsule  Commonly known as:  CYMBALTA  Take 1 capsule (30 mg total) by mouth daily.     feeding supplement (ENSURE ENLIVE) Liqd  Take 237 mLs by  mouth 2 (two) times daily between meals.     folic acid 1 MG tablet  Commonly known as:  FOLVITE  Take 1 tablet (1 mg total) by mouth daily.     metoprolol tartrate 25 MG tablet  Commonly known as:  LOPRESSOR  Take 1 tablet (25 mg total) by mouth 2 (two) times daily.     multivitamin with minerals Tabs tablet  Take 1 tablet by mouth daily.     thiamine 100 MG tablet  Take 1 tablet (100 mg total) by mouth daily.     traZODone 100 MG tablet    Commonly known as:  DESYREL  Take 1 tablet (100 mg total) by mouth at bedtime.       Allergies  Allergen Reactions  . Codeine Hives, Itching and Swelling  . Penicillins Swelling    Has patient had a PCN reaction causing immediate rash, facial/tongue/throat swelling, SOB or lightheadedness with hypotension: Yes Has patient had a PCN reaction causing severe rash involving mucus membranes or skin necrosis: Yes Has patient had a PCN reaction that required hospitalization Yes-ed visit Has patient had a PCN reaction occurring within the last 10 years: Yes If all of the above answers are "NO", then may proceed with Cephalosporin use.   . Depakote [Divalproex Sodium] Hives and Other (See Comments)    "Bug out and hallucinate"  . Morphine Itching  . Depakote Er [Divalproex Sodium Er] Hives  . Oxycodone Itching and Swelling  . Hydrocodone-Acetaminophen Itching and Rash   Follow-up Information    Follow up with [facility]. Schedule an appointment as soon as possible for a visit in 1 week.   Why:  Hospital follow up   Contact information:   [place] [address] [phone]        The results of significant diagnostics from this hospitalization (including imaging, microbiology, ancillary and laboratory) are listed below for reference.    Significant Diagnostic Studies: No results found.  Microbiology: Recent Results (from the past 240 hour(s))  MRSA PCR Screening     Status: None   Collection Time: 09/24/15  1:05 AM  Result Value Ref Range Status   MRSA by PCR NEGATIVE NEGATIVE Final    Comment:        The GeneXpert MRSA Assay (FDA approved for NASAL specimens only), is one component of a comprehensive MRSA colonization surveillance program. It is not intended to diagnose MRSA infection nor to guide or monitor treatment for MRSA infections.      Labs: Basic Metabolic Panel:  Recent Labs Lab 09/23/15 1643  09/25/15 0454 09/26/15 0427  NA 138 140 131*  K 3.7 3.8 3.1*  CL 106 105 101  CO2 20* 27 23  GLUCOSE 119* 78 82  BUN 9 6 6   CREATININE 1.03 0.91 0.77  CALCIUM 8.9 8.4* 8.1*   Liver Function Tests:  Recent Labs Lab 09/23/15 1643  AST 123*  ALT 83*  ALKPHOS 45  BILITOT 0.6  PROT 7.2  ALBUMIN 4.1    Recent Labs Lab 09/23/15 1643  LIPASE 63*   No results for input(s): AMMONIA in the last 168 hours. CBC:  Recent Labs Lab 09/23/15 1643 09/25/15 0454 09/26/15 0427  WBC 6.1 4.8 5.0  HGB 15.9 13.3 13.6  HCT 45.9 39.2 37.8*  MCV 93.1 93.8 92.0  PLT 190 166 171   Cardiac Enzymes: No results for input(s): CKTOTAL, CKMB, CKMBINDEX, TROPONINI in the last 168 hours. BNP: BNP (last  3 results) No results for input(s): BNP in the last 8760 hours.  ProBNP (last 3 results) No results for input(s): PROBNP in the last 8760 hours.  CBG: No results for input(s): GLUCAP in the last 168 hours.     SignedCristal Ford  [facility] 09/26/2015, 9:59 AM

## 2015-09-26 NOTE — Discharge Instructions (Signed)

## 2015-09-26 NOTE — Care Management Note (Signed)
Case Management Note  Patient Details  Name: [name] MRN: [id number] Date of Birth: 1980/08/29  Subjective/Objective:                    Action/Plan:d/c home no needs or orders.   Expected Discharge Date:                Expected Discharge Plan:  Home/Self Care  In-House Referral:     Discharge planning Services  CM Consult  Post Acute Care Choice:    Choice offered to:     DME Arranged:    DME Agency:     HH Arranged:    [place] Agency:     Status of Service:  Completed, signed off  Medicare Important Message Given:    Date Medicare IM Given:    Medicare IM give by:    Date Additional Medicare IM Given:    Additional Medicare Important Message give by:     If discussed at [place] of Stay Meetings, dates discussed:    Additional Comments:  Dessa Phi, RN 09/26/2015, 10:09 AM

## 2015-09-29 IMAGING — CT CT ABD-PELV W/ CM
1 of 2 series · 15 of 32 positions shown, 19 images · IV contrast (OMNIPAQUE 300)
Comparison: Multiple exams, including 04/27/2013 and 03/28/2012

CLINICAL DATA: Recent left partial nephrectomy 04/14/2013 (2 weeks
ago). Abdominal pain with nausea and vomiting. Remote history of
breast silicone injections.

EXAM:
CT ABDOMEN AND PELVIS WITH CONTRAST
TECHNIQUE: Multidetector CT imaging of the abdomen and pelvis was performed
using the standard protocol following bolus administration of
intravenous contrast.
CONTRAST:  100mL OMNIPAQUE IOHEXOL 300 MG/ML  SOLN

[Series 2: abd/pel with · axial · 0.84mm/px · z∈[-454,-59]mm · 15 of 87 slices shown, 19 images]
[im 4/87  soft-tissue]
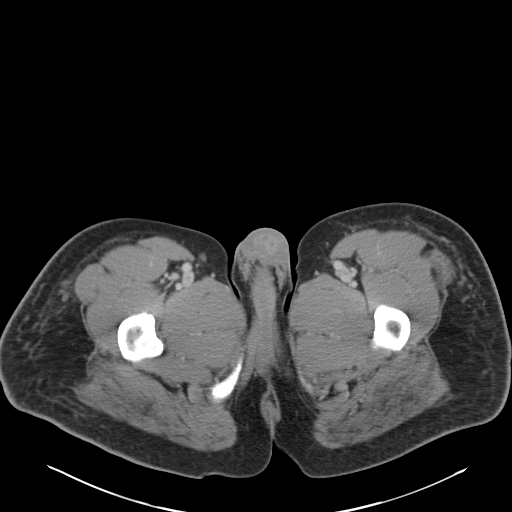
[im 4/87  bone]
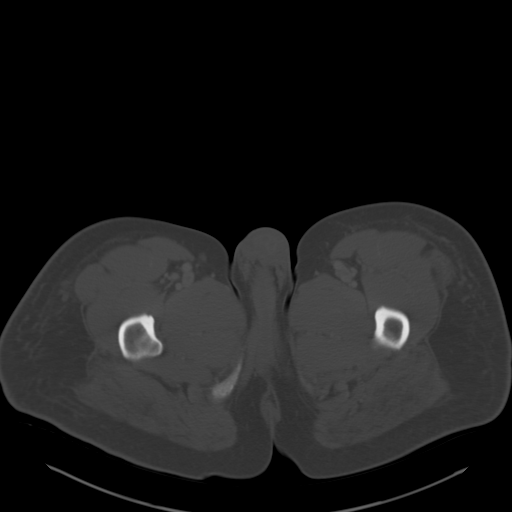
[im 12/87  soft-tissue]
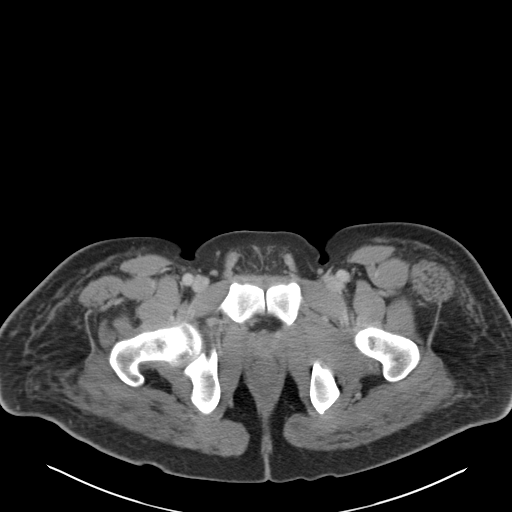
[im 19/87  soft-tissue]
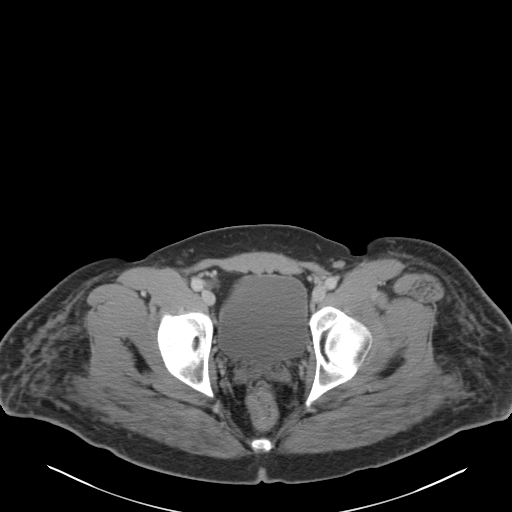
[im 23/87  soft-tissue]
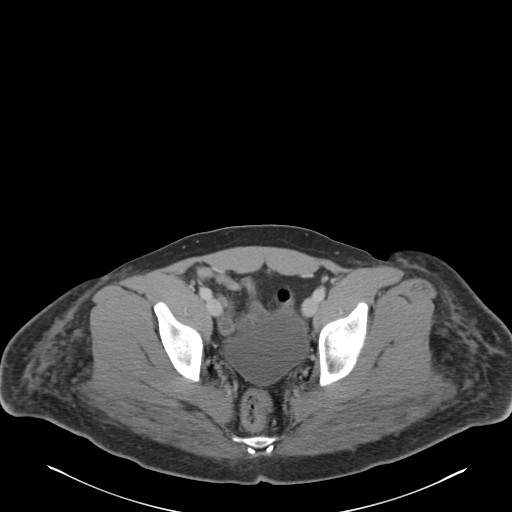
[im 30/87  soft-tissue]
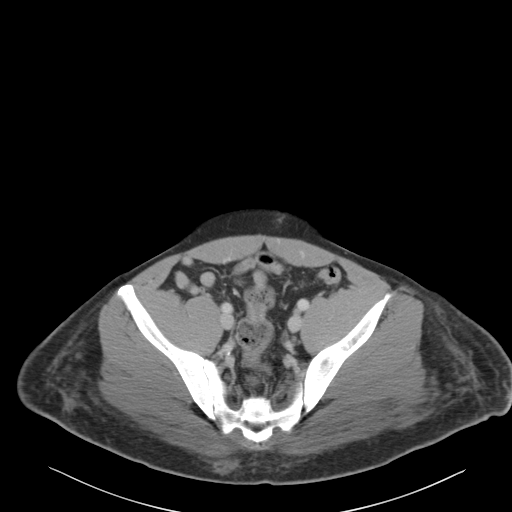
[im 38/87  soft-tissue]
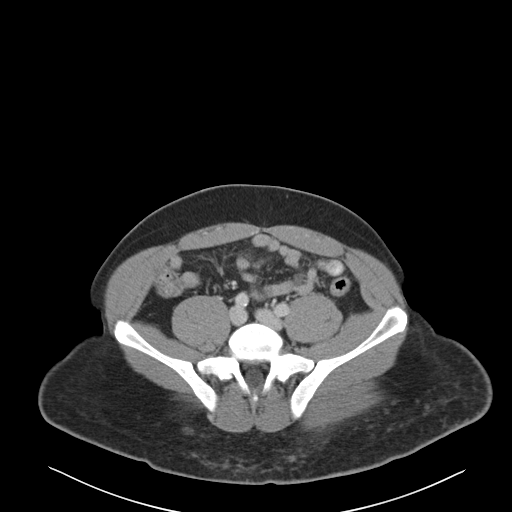
[im 45/87  soft-tissue]
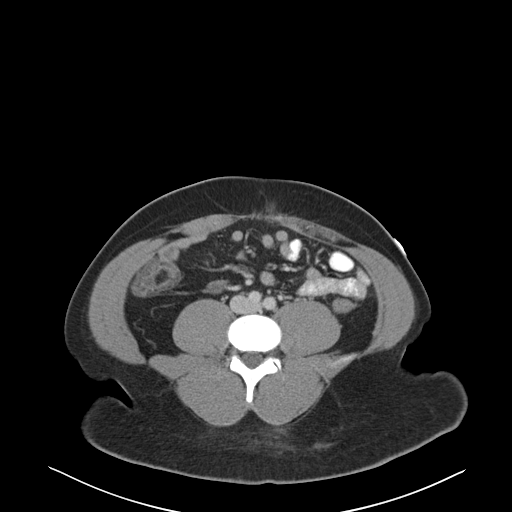
[im 49/87  soft-tissue]
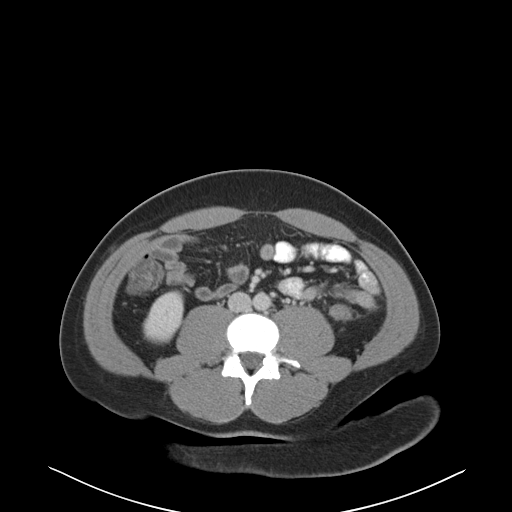
[im 57/87  soft-tissue]
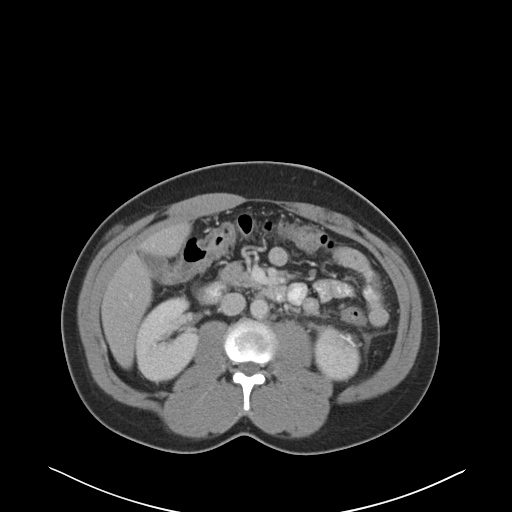
[im 57/87  bone]
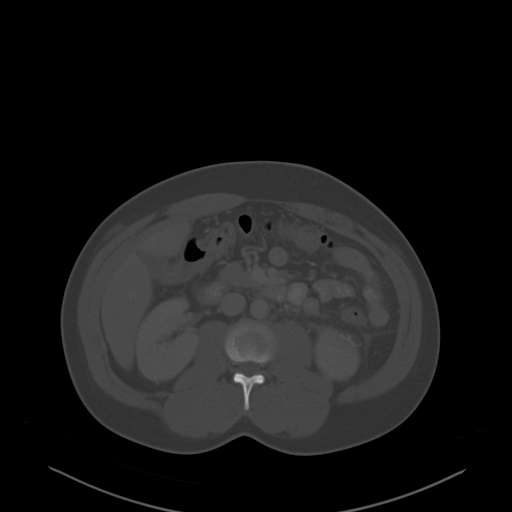
[im 64/87  soft-tissue]
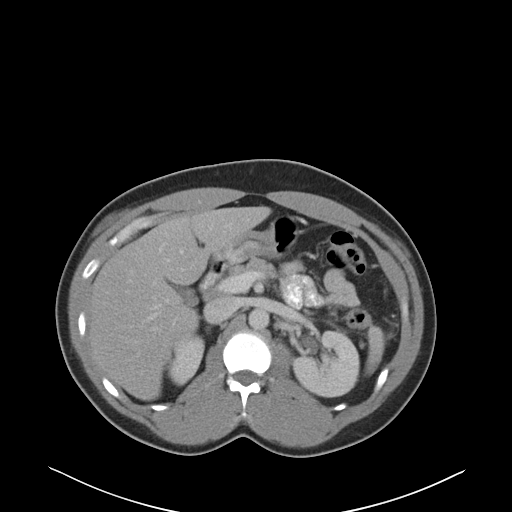
[im 68/87  soft-tissue]
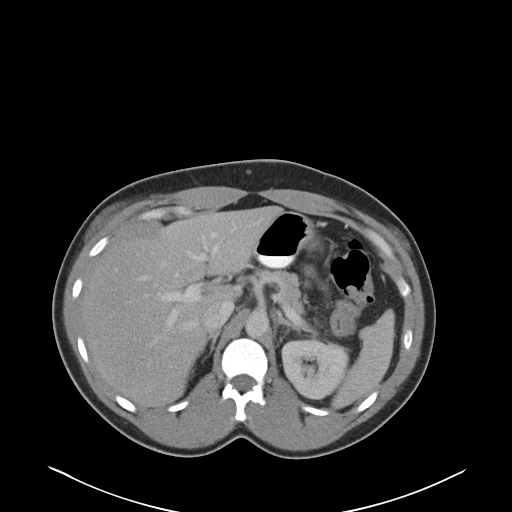
[im 72/87  lung]
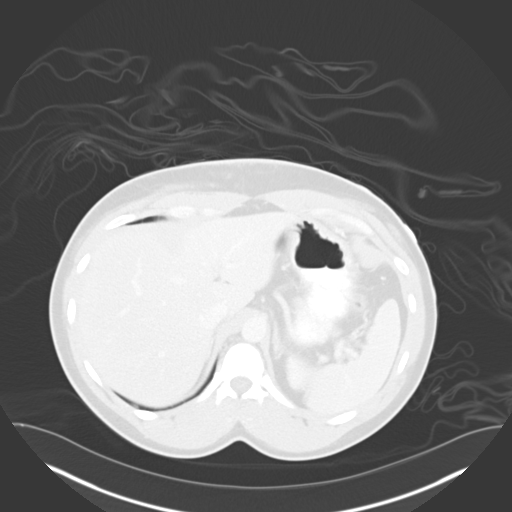
[im 75/87  soft-tissue]
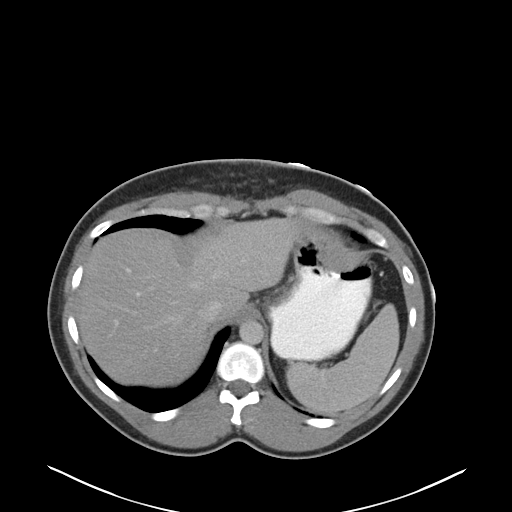
[im 75/87  lung]
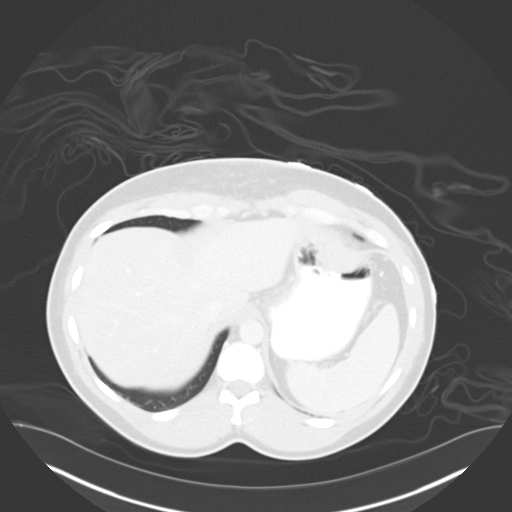
[im 79/87  lung]
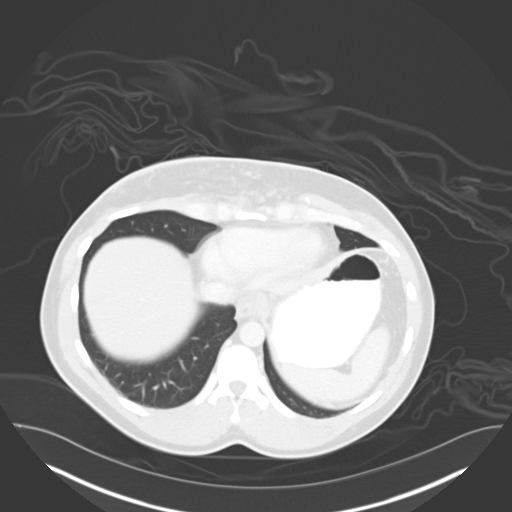
[im 83/87  soft-tissue]
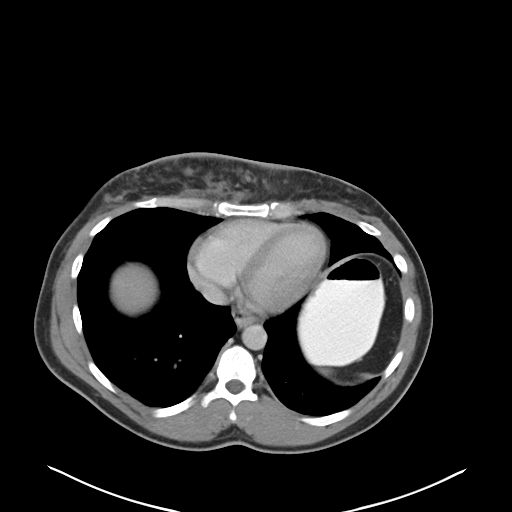
[im 83/87  lung]
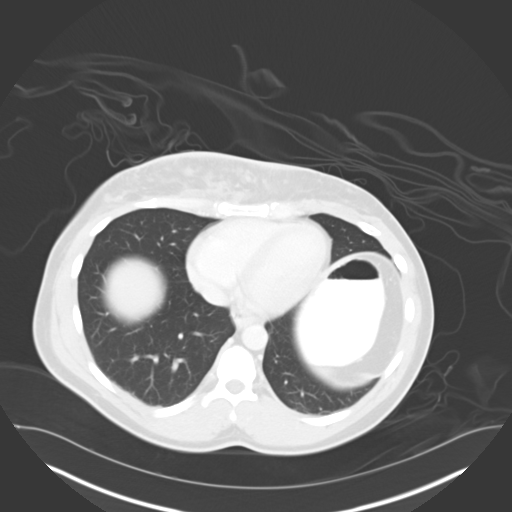

[15 of 32 positions shown; findings below may reference images not displayed]

FINDINGS: Stable appearance of scattered subcutaneous nodules and expansion of
the subcutaneous tissues anteriorly in the right lower chest.
Low-density nodules projects superficial and deep to the upper
portion of the left external oblique muscle and superficial to the
right external oblique muscle, and small subcutaneous nodules are
present along the buttock region bilateral.

The liver, spleen, adrenal glands, and pancreas appear normal.

Partial left nephrectomy noted with low-density/devascularized zone
at the partial nephrectomy margin which is not atypical in the early
postoperative period. Surrounding stranding is likewise expected. No
large capsular or perinephric hematoma observed. Stranding adjacent
to the umbilicus likely represents laparoscopy port.

No dilated bowel observed. No extraluminal gas. No significant
ascites. Urinary bladder unremarkable.
IMPRESSION: 1. No significant hemorrhagic complication from partial nephrectomy
identified. Small devascularized zone along the resection bed is not
unexpected. Surrounding postoperative stranding noted.
2. No bowel obstruction or specific abnormality to explain the
patient's symptoms.
3. Nodularity in the anterior lower chest likely or relates to
granulomatous response from prior silicone injections. The gluteal
nodularity may have a similar etiology.

## 2015-09-29 IMAGING — CR DG ABDOMEN ACUTE W/ 1V CHEST
3 series · 3 of 3 positions shown · non-contrast
Comparison: 04/18/2013 and prior chest radiographs. 12/23/2012 and
prior abdominal radiographs

CLINICAL DATA: 31-year-old male with abdominal pain and nausea/
vomiting.

EXAM:
ACUTE ABDOMEN SERIES (ABDOMEN 2 VIEW & CHEST 1 VIEW)

[w chest pa]
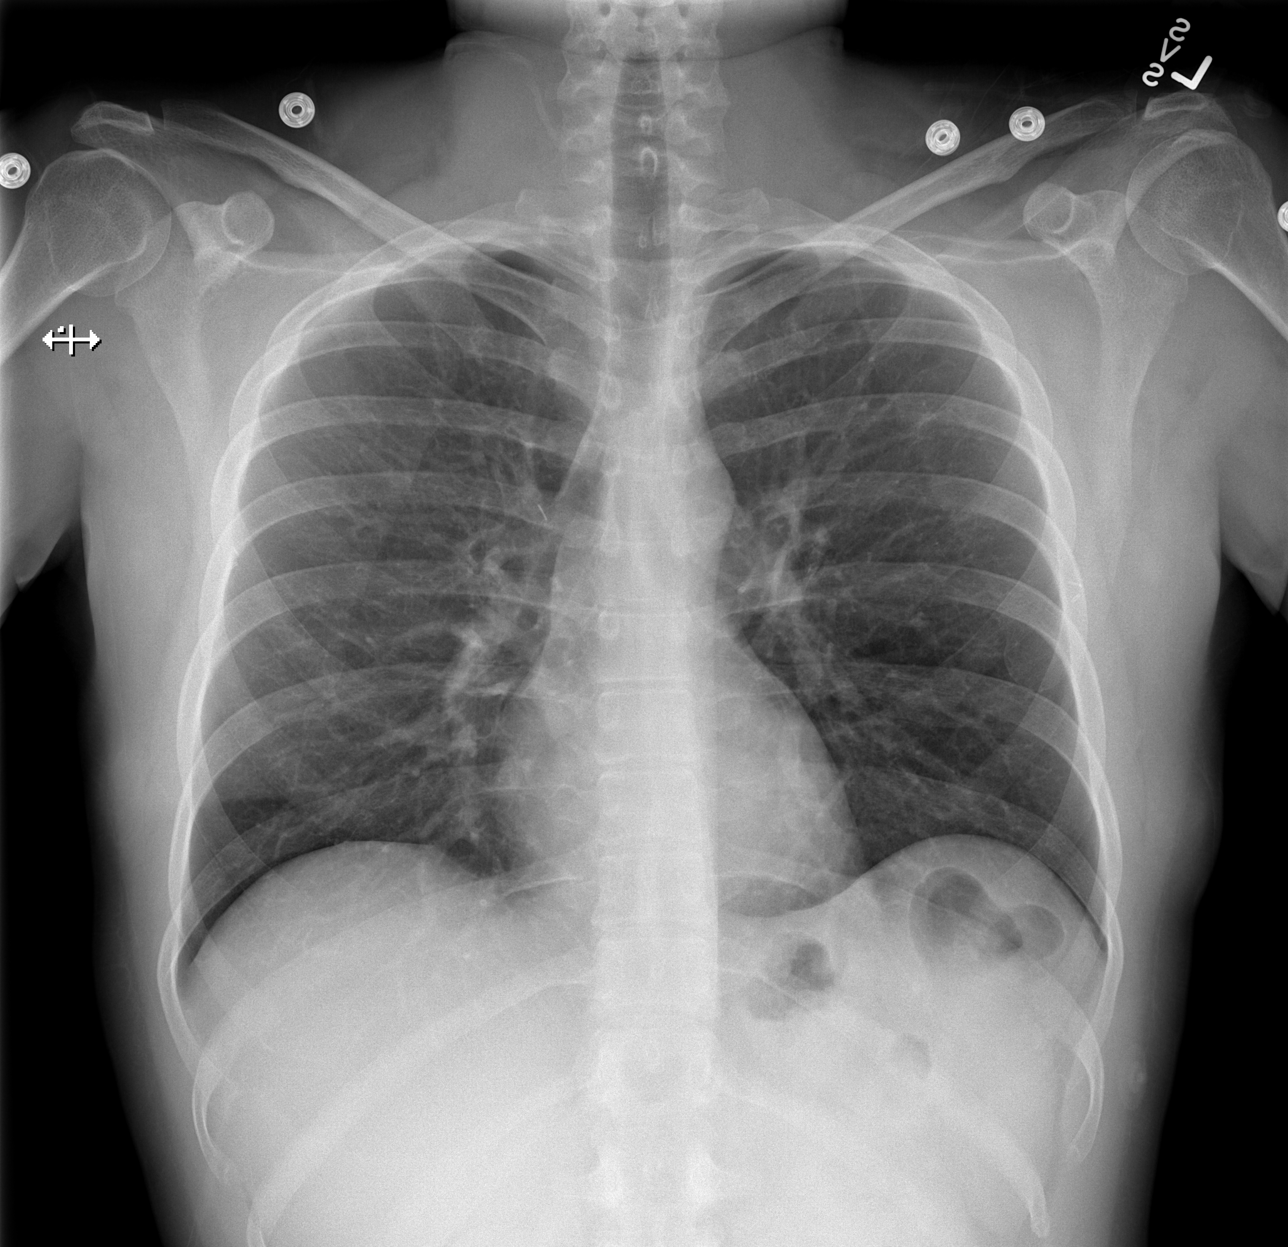

[w abdomen upright]
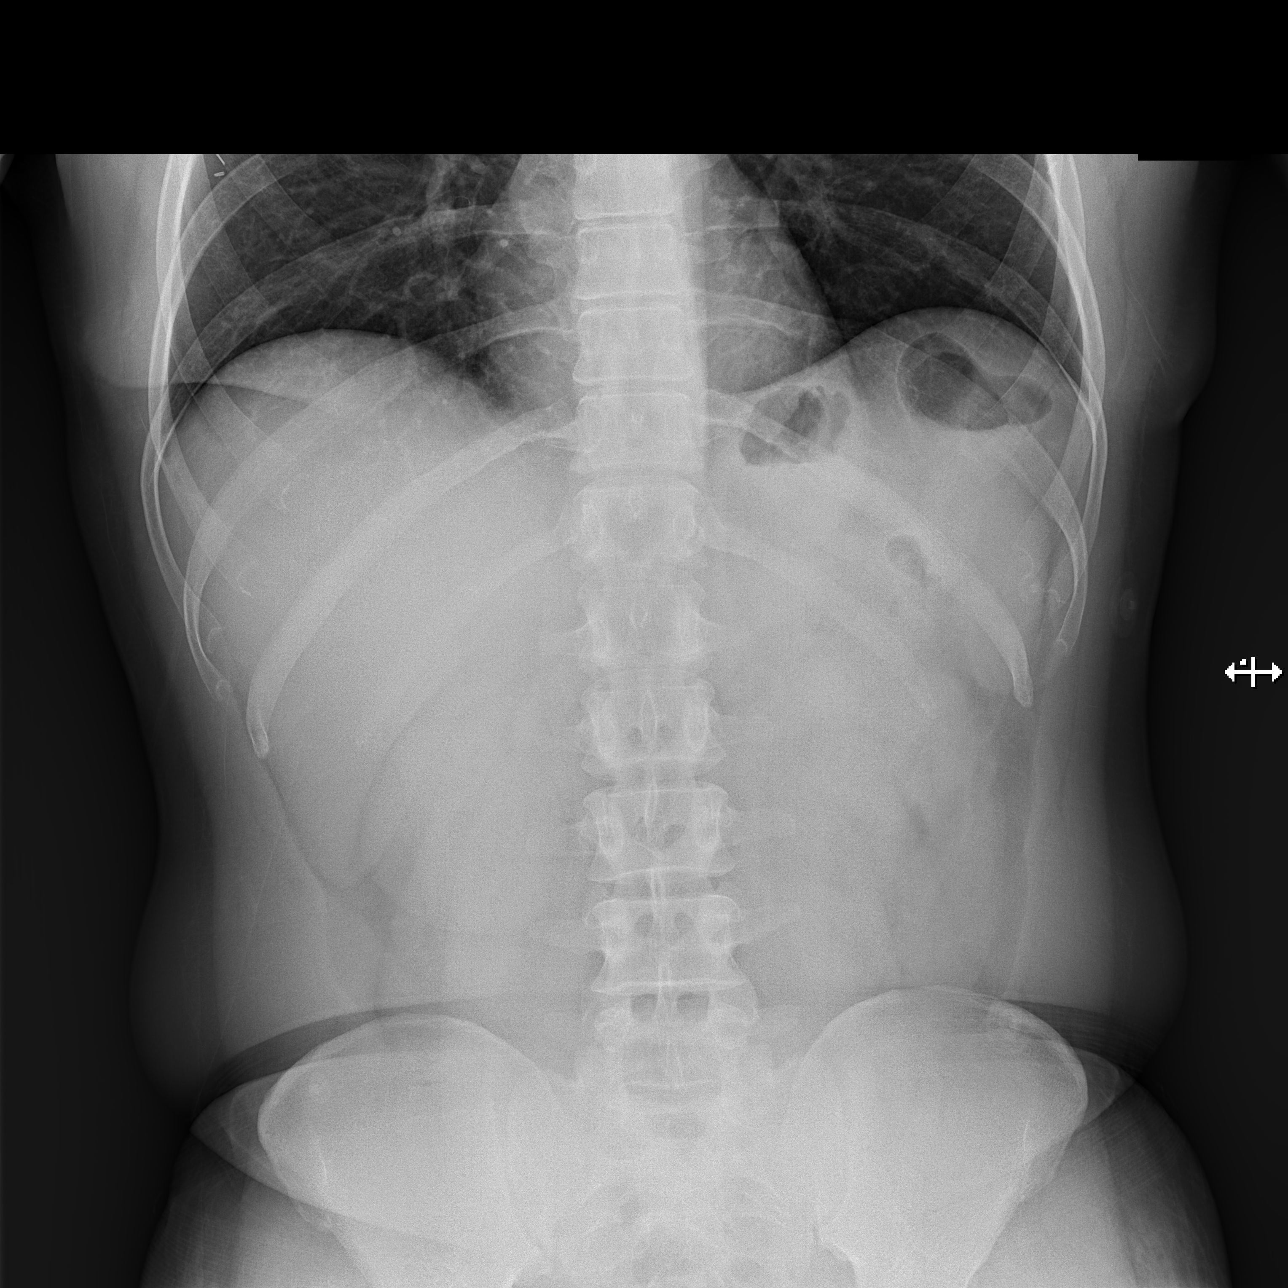

[t abdomen supine]
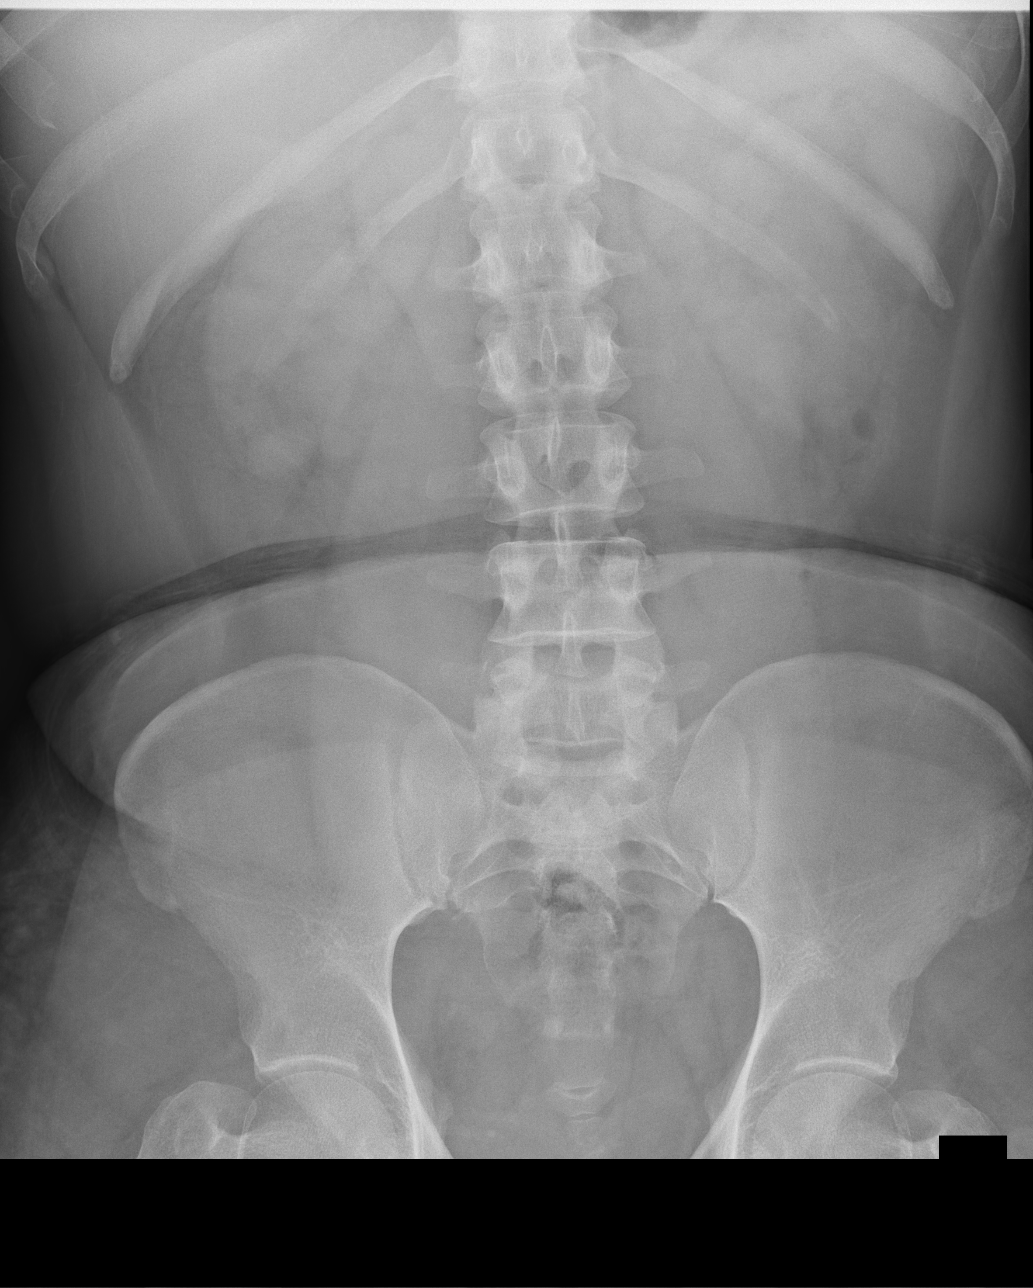

[3 of 3 positions shown; findings below may reference images not displayed]

FINDINGS: The cardiomediastinal silhouette is unremarkable.

Surgical clips overlying the chest are noted.

There is no evidence of focal airspace disease, pulmonary edema,
suspicious pulmonary nodule/mass, pleural effusion, or pneumothorax.
No acute bony abnormalities are identified.

The bowel gas pattern is unremarkable.

There is no evidence of dilated bowel loops, bowel obstruction or
pneumoperitoneum.

No suspicious calcifications are identified.

The bony structures are unremarkable.
IMPRESSION: No acute abnormalities.

## 2015-10-07 ENCOUNTER — Emergency Department (HOSPITAL_COMMUNITY): Payer: Self-pay

## 2015-10-07 ENCOUNTER — Emergency Department (HOSPITAL_COMMUNITY)
Admission: EM | Admit: 2015-10-07 | Discharge: 2015-10-08 | Disposition: A | Discharge status: Other Acute Inpatient Hospital | Payer: Self-pay | Attending: Emergency Medicine | Admitting: Emergency Medicine

## 2015-10-07 ENCOUNTER — Encounter (HOSPITAL_COMMUNITY): Payer: Self-pay

## 2015-10-07 DIAGNOSIS — F1410 Cocaine abuse, uncomplicated: Secondary | ICD-10-CM | POA: Insufficient documentation

## 2015-10-07 DIAGNOSIS — I25119 Atherosclerotic heart disease of native coronary artery with unspecified angina pectoris: Secondary | ICD-10-CM | POA: Insufficient documentation

## 2015-10-07 DIAGNOSIS — F1310 Sedative, hypnotic or anxiolytic abuse, uncomplicated: Secondary | ICD-10-CM | POA: Insufficient documentation

## 2015-10-07 DIAGNOSIS — F1024 Alcohol dependence with alcohol-induced mood disorder: Secondary | ICD-10-CM | POA: Diagnosis present

## 2015-10-07 DIAGNOSIS — F4310 Post-traumatic stress disorder, unspecified: Secondary | ICD-10-CM | POA: Diagnosis present

## 2015-10-07 DIAGNOSIS — Y9289 Other specified places as the place of occurrence of the external cause: Secondary | ICD-10-CM | POA: Insufficient documentation

## 2015-10-07 DIAGNOSIS — Z85528 Personal history of other malignant neoplasm of kidney: Secondary | ICD-10-CM | POA: Insufficient documentation

## 2015-10-07 DIAGNOSIS — Y998 Other external cause status: Secondary | ICD-10-CM | POA: Insufficient documentation

## 2015-10-07 DIAGNOSIS — Z880 Allergy status to penicillin: Secondary | ICD-10-CM | POA: Insufficient documentation

## 2015-10-07 DIAGNOSIS — F10920 Alcohol use, unspecified with intoxication, uncomplicated: Secondary | ICD-10-CM

## 2015-10-07 DIAGNOSIS — R45851 Suicidal ideations: Secondary | ICD-10-CM

## 2015-10-07 DIAGNOSIS — Z043 Encounter for examination and observation following other accident: Secondary | ICD-10-CM | POA: Insufficient documentation

## 2015-10-07 DIAGNOSIS — F209 Schizophrenia, unspecified: Secondary | ICD-10-CM | POA: Insufficient documentation

## 2015-10-07 DIAGNOSIS — E7800 Pure hypercholesterolemia, unspecified: Secondary | ICD-10-CM | POA: Insufficient documentation

## 2015-10-07 DIAGNOSIS — F1910 Other psychoactive substance abuse, uncomplicated: Secondary | ICD-10-CM

## 2015-10-07 DIAGNOSIS — Z8719 Personal history of other diseases of the digestive system: Secondary | ICD-10-CM | POA: Insufficient documentation

## 2015-10-07 DIAGNOSIS — F17200 Nicotine dependence, unspecified, uncomplicated: Secondary | ICD-10-CM | POA: Insufficient documentation

## 2015-10-07 DIAGNOSIS — F319 Bipolar disorder, unspecified: Secondary | ICD-10-CM | POA: Insufficient documentation

## 2015-10-07 DIAGNOSIS — I10 Essential (primary) hypertension: Secondary | ICD-10-CM | POA: Insufficient documentation

## 2015-10-07 DIAGNOSIS — F10120 Alcohol abuse with intoxication, uncomplicated: Secondary | ICD-10-CM | POA: Insufficient documentation

## 2015-10-07 DIAGNOSIS — Y9389 Activity, other specified: Secondary | ICD-10-CM | POA: Insufficient documentation

## 2015-10-07 DIAGNOSIS — Z853 Personal history of malignant neoplasm of breast: Secondary | ICD-10-CM | POA: Insufficient documentation

## 2015-10-07 DIAGNOSIS — F333 Major depressive disorder, recurrent, severe with psychotic symptoms: Secondary | ICD-10-CM | POA: Diagnosis present

## 2015-10-07 DIAGNOSIS — Z79899 Other long term (current) drug therapy: Secondary | ICD-10-CM | POA: Insufficient documentation

## 2015-10-07 LAB — CBC
HCT: 43.9 % (ref 39.0–52.0)
Hemoglobin: 15.9 g/dL (ref 13.0–17.0)
MCH: 32.1 pg (ref 26.0–34.0)
MCHC: 36.2 g/dL — ABNORMAL HIGH (ref 30.0–36.0)
MCV: 88.5 fL (ref 78.0–100.0)
PLATELETS: 269 10*3/uL (ref 150–400)
RBC: 4.96 MIL/uL (ref 4.22–5.81)
RDW: 13.8 % (ref 11.5–15.5)
WBC: 5.6 10*3/uL (ref 4.0–10.5)

## 2015-10-07 LAB — COMPREHENSIVE METABOLIC PANEL
ALT: 44 U/L (ref 17–63)
AST: 69 U/L — ABNORMAL HIGH (ref 15–41)
Albumin: 4.2 g/dL (ref 3.5–5.0)
Alkaline Phosphatase: 46 U/L (ref 38–126)
Anion gap: 14 (ref 5–15)
BUN: 9 mg/dL (ref 6–20)
CHLORIDE: 105 mmol/L (ref 101–111)
CO2: 22 mmol/L (ref 22–32)
CREATININE: 0.97 mg/dL (ref 0.61–1.24)
Calcium: 9.2 mg/dL (ref 8.9–10.3)
Glucose, Bld: 90 mg/dL (ref 65–99)
POTASSIUM: 3.2 mmol/L — AB (ref 3.5–5.1)
Sodium: 141 mmol/L (ref 135–145)
TOTAL PROTEIN: 7.6 g/dL (ref 6.5–8.1)
Total Bilirubin: 0.4 mg/dL (ref 0.3–1.2)

## 2015-10-07 LAB — I-STAT TROPONIN, ED
TROPONIN I, POC: 0 ng/mL (ref 0.00–0.08)
Troponin i, poc: 0 ng/mL (ref 0.00–0.08)

## 2015-10-07 LAB — ACETAMINOPHEN LEVEL: Acetaminophen (Tylenol), Serum: <10 ug/mL — ABNORMAL LOW (ref 10–30)

## 2015-10-07 LAB — ETHANOL: ALCOHOL ETHYL (B): 315 mg/dL — AB (ref <5)

## 2015-10-07 LAB — MAGNESIUM: Magnesium: 2 mg/dL (ref 1.7–2.4)

## 2015-10-07 LAB — SALICYLATE LEVEL

## 2015-10-07 MED ORDER — LORAZEPAM 1 MG PO TABS: 0.0000 mg | ORAL_TABLET | Freq: Two times a day (BID) | ORAL | Status: DC

## 2015-10-07 MED ORDER — FOLIC ACID 1 MG PO TABS
1.0000 mg | ORAL_TABLET | Freq: Every day | ORAL | Status: DC
Start: 2015-10-07 — End: 2015-10-08
  Administered 2015-10-07 – 2015-10-08 (×2): 1 mg via ORAL
  Filled 2015-10-07 (×2): qty 1

## 2015-10-07 MED ORDER — AMLODIPINE BESYLATE 5 MG PO TABS
5.0000 mg | ORAL_TABLET | Freq: Every day | ORAL | Status: DC
  Administered 2015-10-08: 5 mg via ORAL
  Filled 2015-10-07 (×2): qty 1

## 2015-10-07 MED ORDER — ENSURE ENLIVE PO LIQD
237.0000 mL | Freq: Two times a day (BID) | ORAL | Status: DC
  Administered 2015-10-08: 237 mL via ORAL
  Filled 2015-10-07 (×3): qty 237

## 2015-10-07 MED ORDER — VITAMIN B-1 100 MG PO TABS
100.0000 mg | ORAL_TABLET | Freq: Every day | ORAL | Status: DC
  Administered 2015-10-07 – 2015-10-08 (×2): 100 mg via ORAL
  Filled 2015-10-07 (×2): qty 1

## 2015-10-07 MED ORDER — METOPROLOL TARTRATE 25 MG PO TABS
25.0000 mg | ORAL_TABLET | Freq: Two times a day (BID) | ORAL | Status: DC
  Administered 2015-10-07 – 2015-10-08 (×2): 25 mg via ORAL
  Filled 2015-10-07 (×2): qty 1

## 2015-10-07 MED ORDER — TRAZODONE HCL 100 MG PO TABS
100.0000 mg | ORAL_TABLET | Freq: Every day | ORAL | Status: DC
  Administered 2015-10-07: 100 mg via ORAL
  Filled 2015-10-07: qty 1

## 2015-10-07 MED ORDER — ARIPIPRAZOLE 10 MG PO TABS
10.0000 mg | ORAL_TABLET | Freq: Every day | ORAL | Status: DC
  Administered 2015-10-08 (×2): 10 mg via ORAL
  Filled 2015-10-07 (×2): qty 1

## 2015-10-07 MED ORDER — LORAZEPAM 1 MG PO TABS
0.0000 mg | ORAL_TABLET | Freq: Four times a day (QID) | ORAL | Status: DC
  Administered 2015-10-07: 2 mg via ORAL
  Administered 2015-10-08: 1 mg via ORAL
  Filled 2015-10-07: qty 2
  Filled 2015-10-07: qty 1

## 2015-10-07 MED ORDER — ADULT MULTIVITAMIN W/MINERALS CH
1.0000 | ORAL_TABLET | Freq: Every day | ORAL | Status: DC
  Administered 2015-10-07 – 2015-10-08 (×2): 1 via ORAL
  Filled 2015-10-07 (×2): qty 1

## 2015-10-07 MED ORDER — DULOXETINE HCL 30 MG PO CPEP
30.0000 mg | ORAL_CAPSULE | Freq: Every day | ORAL | Status: DC
  Administered 2015-10-08 (×2): 30 mg via ORAL
  Filled 2015-10-07 (×2): qty 1

## 2015-10-07 MED ORDER — POTASSIUM CHLORIDE CRYS ER 20 MEQ PO TBCR
40.0000 meq | EXTENDED_RELEASE_TABLET | Freq: Once | ORAL | Status: AC
  Administered 2015-10-07: 40 meq via ORAL
  Filled 2015-10-07: qty 2

## 2015-10-07 MED ORDER — ATORVASTATIN CALCIUM 20 MG PO TABS
25.0000 mg | ORAL_TABLET | Freq: Every day | ORAL | Status: DC
  Filled 2015-10-07: qty 0.5
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
## 2015-10-07 NOTE — ED Notes (Signed)
Patient received denies SI, HI, A/ V H, and pain at this time.Paitnet endorsed drinking 6 bottles of wine and using crack daily. Physician called for orders. Patient contracted for safety, and endorses anxiety, depression and tactile disturbances mildly at this time but PT states they are worsening. Will continue Q 15 min checks.

## 2015-10-07 NOTE — ED Notes (Signed)
Bed: [facility]4 Expected date:  Expected time:  Means of arrival:  Comments: EMS-crack/etoh/SI

## 2015-10-07 NOTE — ED Notes (Signed)
Pt wanded by security. 

## 2015-10-07 NOTE — ED Notes (Signed)
Dr. Oleta Mouse made aware of critical alcohol level
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
## 2015-10-07 NOTE — ED Notes (Signed)
Pt has one personal belongings bag behind triage nursing station.

## 2015-10-07 NOTE — ED Notes (Signed)
Bed: [facility] Expected date:  Expected time:  Means of arrival:  Comments: T4
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
## 2015-10-08 ENCOUNTER — Observation Stay (HOSPITAL_COMMUNITY)
Admission: AD | Admit: 2015-10-08 | Discharge: 2015-10-09 | Disposition: A | Discharge status: Home / Self Care | Payer: No Typology Code available for payment source | Source: Intra-hospital | Attending: Psychiatry | Admitting: Psychiatry

## 2015-10-08 ENCOUNTER — Encounter (HOSPITAL_COMMUNITY): Payer: Self-pay

## 2015-10-08 DIAGNOSIS — F1024 Alcohol dependence with alcohol-induced mood disorder: Secondary | ICD-10-CM | POA: Diagnosis present

## 2015-10-08 DIAGNOSIS — Z880 Allergy status to penicillin: Secondary | ICD-10-CM | POA: Insufficient documentation

## 2015-10-08 DIAGNOSIS — I10 Essential (primary) hypertension: Secondary | ICD-10-CM | POA: Insufficient documentation

## 2015-10-08 DIAGNOSIS — Z905 Acquired absence of kidney: Secondary | ICD-10-CM | POA: Insufficient documentation

## 2015-10-08 DIAGNOSIS — Z85528 Personal history of other malignant neoplasm of kidney: Secondary | ICD-10-CM | POA: Insufficient documentation

## 2015-10-08 DIAGNOSIS — F32A Depression, unspecified: Secondary | ICD-10-CM | POA: Diagnosis present

## 2015-10-08 DIAGNOSIS — F1020 Alcohol dependence, uncomplicated: Principal | ICD-10-CM | POA: Insufficient documentation

## 2015-10-08 DIAGNOSIS — R45851 Suicidal ideations: Secondary | ICD-10-CM | POA: Insufficient documentation

## 2015-10-08 DIAGNOSIS — F329 Major depressive disorder, single episode, unspecified: Secondary | ICD-10-CM | POA: Diagnosis present

## 2015-10-08 DIAGNOSIS — Z9010 Acquired absence of unspecified breast and nipple: Secondary | ICD-10-CM | POA: Insufficient documentation

## 2015-10-08 DIAGNOSIS — F1410 Cocaine abuse, uncomplicated: Secondary | ICD-10-CM | POA: Diagnosis present

## 2015-10-08 DIAGNOSIS — Z590 Homelessness: Secondary | ICD-10-CM | POA: Insufficient documentation

## 2015-10-08 DIAGNOSIS — F14288 Cocaine dependence with other cocaine-induced disorder: Secondary | ICD-10-CM | POA: Diagnosis present

## 2015-10-08 DIAGNOSIS — F332 Major depressive disorder, recurrent severe without psychotic features: Secondary | ICD-10-CM | POA: Diagnosis present

## 2015-10-08 LAB — RAPID URINE DRUG SCREEN, HOSP PERFORMED
AMPHETAMINES: NOT DETECTED
BARBITURATES: NOT DETECTED
BENZODIAZEPINES: POSITIVE — AB
Cocaine: POSITIVE — AB
Opiates: NOT DETECTED
TETRAHYDROCANNABINOL: NOT DETECTED

## 2015-10-08 LAB — URINE MICROSCOPIC-ADD ON

## 2015-10-08 LAB — URINALYSIS, ROUTINE W REFLEX MICROSCOPIC
Glucose, UA: NEGATIVE mg/dL
HGB URINE DIPSTICK: NEGATIVE
KETONES UR: NEGATIVE mg/dL
Leukocytes, UA: NEGATIVE
NITRITE: NEGATIVE
PROTEIN: 30 mg/dL — AB
SPECIFIC GRAVITY, URINE: 1.031 — AB (ref 1.005–1.030)
pH: 5.5 (ref 5.0–8.0)

## 2015-10-08 LAB — BASIC METABOLIC PANEL
ANION GAP: 9 (ref 5–15)
BUN: 16 mg/dL (ref 6–20)
CALCIUM: 9.9 mg/dL (ref 8.9–10.3)
CO2: 27 mmol/L (ref 22–32)
CREATININE: 1.01 mg/dL (ref 0.61–1.24)
Chloride: 104 mmol/L (ref 101–111)
Glucose, Bld: 112 mg/dL — ABNORMAL HIGH (ref 65–99)
Potassium: 4.3 mmol/L (ref 3.5–5.1)
SODIUM: 140 mmol/L (ref 135–145)

## 2015-10-08 LAB — ETHANOL: Alcohol, Ethyl (B): 49 mg/dL — ABNORMAL HIGH (ref <5)

## 2015-10-08 MED ORDER — ENSURE ENLIVE PO LIQD
237.0000 mL | Freq: Two times a day (BID) | ORAL | Status: DC
  Administered 2015-10-08 – 2015-10-09 (×3): 237 mL via ORAL

## 2015-10-08 MED ORDER — ATORVASTATIN CALCIUM 10 MG PO TABS
25.0000 mg | ORAL_TABLET | Freq: Every day | ORAL | Status: DC
  Administered 2015-10-08: 25 mg via ORAL
  Filled 2015-10-08: qty 3

## 2015-10-08 MED ORDER — ARIPIPRAZOLE 10 MG PO TABS
10.0000 mg | ORAL_TABLET | Freq: Every day | ORAL | Status: DC
  Administered 2015-10-09: 10 mg via ORAL
  Filled 2015-10-08: qty 1

## 2015-10-08 MED ORDER — DULOXETINE HCL 30 MG PO CPEP
30.0000 mg | ORAL_CAPSULE | Freq: Every day | ORAL | Status: DC
  Administered 2015-10-09: 30 mg via ORAL
  Filled 2015-10-08: qty 1

## 2015-10-08 MED ORDER — AMLODIPINE BESYLATE 5 MG PO TABS
5.0000 mg | ORAL_TABLET | Freq: Every day | ORAL | Status: DC
  Administered 2015-10-09: 5 mg via ORAL
  Filled 2015-10-08: qty 1

## 2015-10-08 MED ORDER — TRAZODONE HCL 100 MG PO TABS: 100.0000 mg | ORAL_TABLET | Freq: Every day | ORAL | Status: DC

## 2015-10-08 MED ORDER — LOPERAMIDE HCL 2 MG PO CAPS: 2.0000 mg | ORAL_CAPSULE | ORAL | Status: DC | PRN

## 2015-10-08 MED ORDER — VITAMIN B-1 100 MG PO TABS
100.0000 mg | ORAL_TABLET | Freq: Every day | ORAL | Status: DC
  Administered 2015-10-09: 100 mg via ORAL
  Filled 2015-10-08: qty 1

## 2015-10-08 MED ORDER — ADULT MULTIVITAMIN W/MINERALS CH
1.0000 | ORAL_TABLET | Freq: Every day | ORAL | Status: DC
  Administered 2015-10-08 – 2015-10-09 (×2): 1 via ORAL
  Filled 2015-10-08 (×2): qty 1

## 2015-10-08 MED ORDER — LORAZEPAM 1 MG PO TABS
1.0000 mg | ORAL_TABLET | Freq: Four times a day (QID) | ORAL | Status: DC | PRN
  Administered 2015-10-09: 1 mg via ORAL
  Filled 2015-10-08: qty 1

## 2015-10-08 MED ORDER — HYDROXYZINE HCL 25 MG PO TABS: 25.0000 mg | ORAL_TABLET | Freq: Four times a day (QID) | ORAL | Status: DC | PRN

## 2015-10-08 MED ORDER — VITAMIN B-1 100 MG PO TABS: 100.0000 mg | ORAL_TABLET | Freq: Every day | ORAL | Status: DC

## 2015-10-08 MED ORDER — ONDANSETRON 4 MG PO TBDP: 4.0000 mg | ORAL_TABLET | Freq: Four times a day (QID) | ORAL | Status: DC | PRN

## 2015-10-08 MED ORDER — FOLIC ACID 1 MG PO TABS
1.0000 mg | ORAL_TABLET | Freq: Every day | ORAL | Status: DC
  Administered 2015-10-09: 1 mg via ORAL
  Filled 2015-10-08: qty 1

## 2015-10-08 MED ORDER — METOPROLOL TARTRATE 25 MG PO TABS
25.0000 mg | ORAL_TABLET | Freq: Two times a day (BID) | ORAL | Status: DC
  Administered 2015-10-08 – 2015-10-09 (×2): 25 mg via ORAL
  Filled 2015-10-08 (×2): qty 1

## 2015-10-08 MED ORDER — ADULT MULTIVITAMIN W/MINERALS CH: 1.0000 | ORAL_TABLET | Freq: Every day | ORAL | Status: DC

## 2015-10-08 MED ORDER — THIAMINE HCL 100 MG/ML IJ SOLN
100.0000 mg | Freq: Once | INTRAMUSCULAR | Status: AC
  Administered 2015-10-08: 100 mg via INTRAMUSCULAR
  Filled 2015-10-08: qty 2

## 2015-10-08 NOTE — BH Assessment (Signed)
[place] Assessment Progress Note  Per Corena Pilgrim, MD, this pt would benefit from admission to the [facility] Observation Unit at this time.  Letitia Libra, RN, [facility] has assigned pt to Obs1.  Pt has signed Voluntary Admission and Consent for Treatment, as well as Consent to Release Information to no one, and signed forms have been faxed to [facility].  Pt's nurse, Caryl Pina, has been notified, and agrees to send original paperwork along with pt via [facility], and to call report to [phone] or [phone].  Jalene Mullet, [place] Triage Specialist [phone]

## 2015-10-08 NOTE — BH Assessment (Signed)
Patient referral faxed to the following facilities:  [facility] at capacity per [place].  Rosalin Hawking, LCSW Therapeutic Triage Specialist [place] 10/08/2015 6:28 AM

## 2015-10-08 NOTE — ED Notes (Signed)
Called lab for bmp drawl.

## 2015-10-08 NOTE — H&P (Signed)
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   Collection Time: 10/08/15  6:00 AM  Result Value Ref Range   Color, Urine AMBER (A) YELLOW  Comment: BIOCHEMICALS MAY BE AFFECTED BY COLOR   APPearance CLOUDY (A) CLEAR   Specific Gravity, Urine 1.031 (H) 1.005 - 1.030   pH 5.5 5.0 - 8.0   Glucose, UA NEGATIVE NEGATIVE mg/dL   Hgb urine dipstick NEGATIVE NEGATIVE   Bilirubin Urine SMALL (A) NEGATIVE   Ketones, ur NEGATIVE NEGATIVE mg/dL   Protein, ur 30 (A) NEGATIVE mg/dL   Nitrite NEGATIVE NEGATIVE   Leukocytes, UA NEGATIVE NEGATIVE  Urine microscopic-add on     Status: Abnormal   Collection Time: 10/08/15  6:00 AM  Result Value Ref Range   Squamous Epithelial / LPF 0-5 (A) NONE SEEN   WBC, UA 0-5 0 - 5 WBC/hpf   RBC / HPF 0-5 0 - 5 RBC/hpf   Bacteria, UA RARE (A) NONE SEEN   Crystals CA OXALATE CRYSTALS (A) NEGATIVE   Urine-Other MUCOUS PRESENT   Basic metabolic panel     Status: Abnormal    Collection Time: 10/08/15 11:40 AM  Result Value Ref Range   Sodium 140 135 - 145 mmol/L   Potassium 4.3 3.5 - 5.1 mmol/L    Comment: REPEATED TO VERIFY DELTA CHECK NOTED    Chloride 104 101 - 111 mmol/L   CO2 27 22 - 32 mmol/L   Glucose, Bld 112 (H) 65 - 99 mg/dL   BUN 16 6 - 20 mg/dL   Creatinine, Ser 1.01 0.61 - 1.24 mg/dL   Calcium 9.9 8.9 - 10.3 mg/dL   GFR calc non Af Amer >60 >60 mL/min   GFR calc Af Amer >60 >60 mL/min    Comment: (NOTE) The eGFR has been calculated using the CKD EPI equation. This calculation has not been validated in all clinical situations. eGFR's persistently <60 mL/min signify possible Chronic Kidney Disease.    Anion gap 9 5 - 15    Psychological Evaluations: No   Treatment Plan Summary: Alcohol use disorder, severe, dependence ([place]), unstable at this time and will monitor overnight for acute withdrawal and/or suicidal ideation with self-injurious behaviors or gestures; However, pt does have some suicidal ideation without plan/intent at baseline.   Medications: -CIWA/ Ativan protocol -Continue Abilify 41m PO daily -Continue Cymbalta DR 323mdaily  -Continue Trazodone 1004mO qhs prn insomnia  Disposition: Hold overnight for stabilization as above.   WitBenjamine MolaNP-BC 3/22/20175:33 PM I agree with assessment and plan IrvGeralyn Flash LugSabra Heck.D.

## 2015-10-08 NOTE — ED Notes (Signed)
[facility] at facility to transfer pt to Observation unit per MD order. Pt signed for personal belongings and personal belongings given to [facility] for transfer. Pt e-signed. Ambulatory out of facility

## 2015-10-08 NOTE — Progress Notes (Signed)
Patient ID: [name], male   DOB: 1981-06-19, 35 y.o.   MRN: [id number] Pt admitted to OBS unit via [facility]. Patient was oriented to the unit, and expectations. He states '' I'm here for drinking,and I'm suicidal. I've been drinking 4 to 5 bottles of wine a day and smoking cocaine. '' He also reports feeling suicidal , stating '' I don't have a plan, but yeah, I'm suicidal. '' Patient agrees he can be safe while in secure environment of the hospital, and agrees to alert staff should those thoughts arise. He denies any VH/AH or HI. His skin was searched and no contraband found, no skin integrity issues noted.  offered meal tray and fluids which patient accepted. He denies any other issues at this time. Appears in no acute distress. No further voiced concerns. Will continue to monitor q 15 minutes for safety .

## 2015-10-09 MED ORDER — TRAZODONE HCL 100 MG PO TABS: 100.0000 mg | ORAL_TABLET | Freq: Every day | ORAL | Status: DC

## 2015-10-09 MED ORDER — ATORVASTATIN CALCIUM 20 MG PO TABS: 20.0000 mg | ORAL_TABLET | ORAL | Status: DC

## 2015-10-09 MED ORDER — HYDROXYZINE HCL 25 MG PO TABS: 25.0000 mg | ORAL_TABLET | Freq: Four times a day (QID) | ORAL | Status: DC | PRN

## 2015-10-09 MED ORDER — METOPROLOL TARTRATE 25 MG PO TABS: 25.0000 mg | ORAL_TABLET | Freq: Two times a day (BID) | ORAL | Status: DC

## 2015-10-09 MED ORDER — ARIPIPRAZOLE 10 MG PO TABS: 10.0000 mg | ORAL_TABLET | Freq: Every day | ORAL | Status: DC

## 2015-10-09 MED ORDER — AMLODIPINE BESYLATE 5 MG PO TABS: 5.0000 mg | ORAL_TABLET | Freq: Every day | ORAL | Status: DC

## 2015-10-09 MED ORDER — DULOXETINE HCL 30 MG PO CPEP: 30.0000 mg | ORAL_CAPSULE | Freq: Every day | ORAL | Status: DC

## 2015-10-09 NOTE — Discharge Summary (Signed)
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 Barbiturate      200 Benzodiazepine   353 Tricyclics  300 Opiates          300 Cocaine          300 THC              50   Urinalysis, Routine w reflex microscopic (not at [facility])     Status: Abnormal   Collection Time: 10/08/15  6:00 AM  Result Value Ref Range   Color, Urine AMBER (A) YELLOW    Comment: BIOCHEMICALS MAY BE AFFECTED BY COLOR   APPearance CLOUDY (A) CLEAR   Specific Gravity, Urine 1.031 (H) 1.005 - 1.030   pH 5.5 5.0 - 8.0   Glucose, UA NEGATIVE NEGATIVE mg/dL   Hgb urine dipstick NEGATIVE NEGATIVE   Bilirubin Urine SMALL (A) NEGATIVE   Ketones, ur NEGATIVE NEGATIVE mg/dL   Protein, ur 30 (A) NEGATIVE mg/dL   Nitrite NEGATIVE NEGATIVE   Leukocytes, UA NEGATIVE NEGATIVE  Urine microscopic-add on     Status: Abnormal   Collection Time: 10/08/15  6:00 AM  Result Value Ref Range   Squamous Epithelial / LPF 0-5 (A) NONE SEEN   WBC,  UA 0-5 0 - 5 WBC/hpf   RBC / HPF 0-5 0 - 5 RBC/hpf   Bacteria, UA RARE (A) NONE SEEN   Crystals CA OXALATE CRYSTALS (A) NEGATIVE   Urine-Other MUCOUS PRESENT   Basic metabolic panel     Status: Abnormal   Collection Time: 10/08/15 11:40 AM  Result Value Ref Range   Sodium 140 135 - 145 mmol/L   Potassium 4.3 3.5 - 5.1 mmol/L    Comment: REPEATED TO VERIFY DELTA CHECK NOTED    Chloride 104 101 - 111 mmol/L   CO2 27 22 - 32 mmol/L   Glucose, Bld 112 (H) 65 - 99 mg/dL   BUN 16 6 - 20 mg/dL   Creatinine, Ser 1.01 0.61 - 1.24 mg/dL   Calcium 9.9 8.9 - 10.3 mg/dL   GFR calc non Af Amer >60 >60 mL/min   GFR calc Af Amer >60 >60 mL/min    Comment: (NOTE) The eGFR has been calculated using the CKD EPI equation. This calculation has not been validated in all clinical situations. eGFR's persistently <60 mL/min signify possible Chronic Kidney Disease.    Anion gap 9 5 - 15    Psychological Evaluations: No   Treatment Plan Summary: Alcohol use disorder, severe, dependence ([place]), stable, improving, and appropriate for outpatient treatment  Medications: (unchanged on 10/09/2015 ) -CIWA/ Ativan protocol -Continue Abilify 17m PO daily -Continue Cymbalta DR 35mdaily  -Continue Trazodone 10036mO qhs prn insomnia  Disposition: Discharge home with resources as mentioned above Rx for 14 day supply of critical medications  WitBenjamine MolaNP-BC 3/23/20171:28 PM

## 2015-10-09 NOTE — Progress Notes (Signed)
Eston Mould., Provider, in to talk to patient.

## 2015-10-09 NOTE — BHH Counselor (Signed)
Pt was seen by Heloise Purpura who diagnosed pt with Alcohol use disorder, severe, dependence ([place]), unstable at this time and will monitor overnight for acute withdrawal and/or suicidal ideation with self-injurious behaviors or gestures; However, pt does have some suicidal ideation without plan/intent at baseline. Pt will be reassessed in the A.M. by extender to further determine disposition. Pt is SI, but denies HI and AVH. Pt verbally agrees to contract for safely. Pt is currently homeless, but states that his mother is supportive of him. Pt shares that he currently goes to [place], and has a therapist there. Pt would benefit from inpatient hospitalization due to current SI. This Probation officer will continue to monitor pt and encourage pt throughout the remainder of the shift.  Redmond Pulling, MA OBS Counselor

## 2015-10-09 NOTE — Progress Notes (Signed)
Nursing Shift Note:  Patient is easily awakened for vital signs and for medication administration. Patient is pleasant, appropriate, does rate his current depression level at a "10" and anxiety at "about an 8", admits to passive SI without a plan, denies any HI or AVH. Patient does verbally contract for safety in that he agrees to alert staff first before acting out on any self harm acts should he develop any such thougths. Nurse providing emotional support, thereapeutic communication, and medication administration and also ensuring continuous observation of patient for safety except for when pt in the bathroom. Patient remains safe on Unit.

## 2015-10-09 NOTE — Progress Notes (Signed)
Nursing Discharge Note:  Patient has received all belongings and signed for them as well as signing the Discharge Summary and also given a copy of same as well as prescriptions. Patient continues to deny any SI/HI/AVH and states understanding of followup appointments and medications prescribed.  Patient escorted from Unit by staff member to [facility] for pick up by his Mother.

## 2015-10-09 NOTE — Progress Notes (Signed)
Pt awake at shift change.  Pt deny. SI, HI and AVH.  Pt verbally agrees to contract for safely.  Pt continuously observed on unit except during bathroom visits.Pt is currently asleep.

## 2015-10-23 ENCOUNTER — Emergency Department (HOSPITAL_COMMUNITY)
Admission: EM | Admit: 2015-10-23 | Discharge: 2015-10-23 | Disposition: A | Discharge status: Home / Self Care | Payer: No Typology Code available for payment source | Attending: Emergency Medicine | Admitting: Emergency Medicine

## 2015-10-23 DIAGNOSIS — Z853 Personal history of malignant neoplasm of breast: Secondary | ICD-10-CM | POA: Insufficient documentation

## 2015-10-23 DIAGNOSIS — I25119 Atherosclerotic heart disease of native coronary artery with unspecified angina pectoris: Secondary | ICD-10-CM | POA: Insufficient documentation

## 2015-10-23 DIAGNOSIS — F209 Schizophrenia, unspecified: Secondary | ICD-10-CM | POA: Insufficient documentation

## 2015-10-23 DIAGNOSIS — F319 Bipolar disorder, unspecified: Secondary | ICD-10-CM | POA: Insufficient documentation

## 2015-10-23 DIAGNOSIS — F1094 Alcohol use, unspecified with alcohol-induced mood disorder: Secondary | ICD-10-CM

## 2015-10-23 DIAGNOSIS — F1014 Alcohol abuse with alcohol-induced mood disorder: Secondary | ICD-10-CM | POA: Insufficient documentation

## 2015-10-23 DIAGNOSIS — Z880 Allergy status to penicillin: Secondary | ICD-10-CM | POA: Insufficient documentation

## 2015-10-23 DIAGNOSIS — R45851 Suicidal ideations: Secondary | ICD-10-CM

## 2015-10-23 DIAGNOSIS — F1410 Cocaine abuse, uncomplicated: Secondary | ICD-10-CM | POA: Insufficient documentation

## 2015-10-23 DIAGNOSIS — Z85528 Personal history of other malignant neoplasm of kidney: Secondary | ICD-10-CM | POA: Insufficient documentation

## 2015-10-23 DIAGNOSIS — Z79899 Other long term (current) drug therapy: Secondary | ICD-10-CM | POA: Insufficient documentation

## 2015-10-23 DIAGNOSIS — I10 Essential (primary) hypertension: Secondary | ICD-10-CM | POA: Insufficient documentation

## 2015-10-23 DIAGNOSIS — F17200 Nicotine dependence, unspecified, uncomplicated: Secondary | ICD-10-CM | POA: Insufficient documentation

## 2015-10-23 DIAGNOSIS — F919 Conduct disorder, unspecified: Secondary | ICD-10-CM | POA: Insufficient documentation

## 2015-10-23 DIAGNOSIS — E7800 Pure hypercholesterolemia, unspecified: Secondary | ICD-10-CM | POA: Insufficient documentation

## 2015-10-23 DIAGNOSIS — Z8719 Personal history of other diseases of the digestive system: Secondary | ICD-10-CM | POA: Insufficient documentation

## 2015-10-23 LAB — COMPREHENSIVE METABOLIC PANEL
ALBUMIN: 4.1 g/dL (ref 3.5–5.0)
ALK PHOS: 44 U/L (ref 38–126)
ALT: 40 U/L (ref 17–63)
ANION GAP: 12 (ref 5–15)
AST: 63 U/L — ABNORMAL HIGH (ref 15–41)
BILIRUBIN TOTAL: 0.5 mg/dL (ref 0.3–1.2)
BUN: 7 mg/dL (ref 6–20)
CALCIUM: 9 mg/dL (ref 8.9–10.3)
CO2: 23 mmol/L (ref 22–32)
Chloride: 109 mmol/L (ref 101–111)
Creatinine, Ser: 0.84 mg/dL (ref 0.61–1.24)
GFR calc non Af Amer: >60 mL/min (ref >60)
GLUCOSE: 111 mg/dL — AB (ref 65–99)
POTASSIUM: 3.5 mmol/L (ref 3.5–5.1)
SODIUM: 144 mmol/L (ref 135–145)
TOTAL PROTEIN: 7.3 g/dL (ref 6.5–8.1)

## 2015-10-23 LAB — ETHANOL
ALCOHOL ETHYL (B): 184 mg/dL — AB (ref <5)
Alcohol, Ethyl (B): 418 mg/dL (ref <5)

## 2015-10-23 LAB — RAPID URINE DRUG SCREEN, HOSP PERFORMED
Amphetamines: NOT DETECTED
BENZODIAZEPINES: NOT DETECTED
Barbiturates: NOT DETECTED
COCAINE: POSITIVE — AB
OPIATES: NOT DETECTED
Tetrahydrocannabinol: NOT DETECTED

## 2015-10-23 LAB — CBC
HCT: 43.9 % (ref 39.0–52.0)
Hemoglobin: 15.6 g/dL (ref 13.0–17.0)
MCH: 31.8 pg (ref 26.0–34.0)
MCHC: 35.5 g/dL (ref 30.0–36.0)
MCV: 89.6 fL (ref 78.0–100.0)
PLATELETS: 157 10*3/uL (ref 150–400)
RBC: 4.9 MIL/uL (ref 4.22–5.81)
RDW: 13.4 % (ref 11.5–15.5)
WBC: 4.9 10*3/uL (ref 4.0–10.5)

## 2015-10-23 LAB — SALICYLATE LEVEL

## 2015-10-23 LAB — ACETAMINOPHEN LEVEL

## 2015-10-23 MED ORDER — ONDANSETRON HCL 4 MG PO TABS: 4.0000 mg | ORAL_TABLET | Freq: Three times a day (TID) | ORAL | Status: DC | PRN

## 2015-10-23 MED ORDER — TRAZODONE HCL 100 MG PO TABS: 100.0000 mg | ORAL_TABLET | Freq: Every day | ORAL | Status: DC

## 2015-10-23 MED ORDER — LORAZEPAM 1 MG PO TABS: 0.0000 mg | ORAL_TABLET | Freq: Two times a day (BID) | ORAL | Status: DC

## 2015-10-23 MED ORDER — ALUM & MAG HYDROXIDE-SIMETH 200-200-20 MG/5ML PO SUSP: 30.0000 mL | ORAL | Status: DC | PRN

## 2015-10-23 MED ORDER — SODIUM CHLORIDE 0.9 % IV BOLUS (SEPSIS): 1000.0000 mL | Freq: Once | INTRAVENOUS | Status: DC

## 2015-10-23 MED ORDER — ZOLPIDEM TARTRATE 5 MG PO TABS: 5.0000 mg | ORAL_TABLET | Freq: Every evening | ORAL | Status: DC | PRN

## 2015-10-23 MED ORDER — NICOTINE 21 MG/24HR TD PT24: 21.0000 mg | MEDICATED_PATCH | Freq: Every day | TRANSDERMAL | Status: DC

## 2015-10-23 MED ORDER — ARIPIPRAZOLE 10 MG PO TABS
10.0000 mg | ORAL_TABLET | Freq: Every day | ORAL | Status: DC
  Administered 2015-10-23: 10 mg via ORAL
  Filled 2015-10-23: qty 1

## 2015-10-23 MED ORDER — HYDROXYZINE HCL 25 MG PO TABS: 25.0000 mg | ORAL_TABLET | Freq: Four times a day (QID) | ORAL | Status: DC | PRN

## 2015-10-23 MED ORDER — METOPROLOL TARTRATE 25 MG PO TABS
25.0000 mg | ORAL_TABLET | Freq: Two times a day (BID) | ORAL | Status: DC
  Administered 2015-10-23: 25 mg via ORAL
  Filled 2015-10-23: qty 1

## 2015-10-23 MED ORDER — LORAZEPAM 1 MG PO TABS
0.0000 mg | ORAL_TABLET | Freq: Four times a day (QID) | ORAL | Status: DC
  Administered 2015-10-23: 2 mg via ORAL
  Filled 2015-10-23: qty 2

## 2015-10-23 MED ORDER — ATORVASTATIN CALCIUM 20 MG PO TABS: 20.0000 mg | ORAL_TABLET | Freq: Every day | ORAL | Status: DC

## 2015-10-23 MED ORDER — DULOXETINE HCL 30 MG PO CPEP
30.0000 mg | ORAL_CAPSULE | Freq: Every day | ORAL | Status: DC
  Administered 2015-10-23: 30 mg via ORAL
  Filled 2015-10-23: qty 1

## 2015-10-23 MED ORDER — AMLODIPINE BESYLATE 5 MG PO TABS
5.0000 mg | ORAL_TABLET | Freq: Every day | ORAL | Status: DC
  Administered 2015-10-23: 5 mg via ORAL
  Filled 2015-10-23: qty 1

## 2015-10-23 MED ORDER — IBUPROFEN 200 MG PO TABS: 600.0000 mg | ORAL_TABLET | Freq: Three times a day (TID) | ORAL | Status: DC | PRN

## 2015-10-23 NOTE — Progress Notes (Signed)
Pt confirmed with ED CM that his pcp is [address] Pt informed Cm he wanted to speak with his nurse so he could get to the bus stop When Cm discussed with him that he was not up for d/c he stated he needed to get home to his apartment and asked to speak with ED RN again  RN notified by NT

## 2015-10-23 NOTE — BH Assessment (Addendum)
Assessment Note  [name] is an 35 y.o. male with history of Schizophrenia, Bipolar I Disorder, and Depression. He presents to [facility] via EMS. Patients BAL was 418 upon arrival. Per ED notes, he was visibly intoxicated upon arrival. Writer met with patient face to face. He was sleeping but easily aroused. Patient awakened stating, "I'm ready to go home now I'm not suicidal anymore". Patient admits that he was suicidal upon arrival but feels better at this time. He doesn't elaborate on any stressors stating, "I just have stress..ok..I just get stressed out". He admits to having a suicidal plan earlier today which included overdosing or cutting himself. He has access to means (Benadryl and sharp objects). Patient has made over 11 suicide attempts in the past which also included overdoses and cutting. He does not elaborate on the triggers related to his previous suicide attempts stating, "Lady I can't remember". Patient has a history of self mutilating behaviors; cutting. [name] last self mutilating episode was 1 month ago. Patient has another noted ED admission for similar circumstances less than 2 weeks ago. Patient admits to increased depression and anxiety. He reports depressive symptoms such as loss of interest in usual pleasures, fatigue, hopelessness, and isolating self from others. Patient is not sure if he has a family history of mental health illness.   Patient denies HI. He does have current legal charges for shoplifting. He has a upcoming court date 11/03/2015. Patient is not on probation. He denies AVH's. Patient receives outpatient mental health services at [facility]. Patient has received treatment in a inpatient psychiatric setting multiple times. Patient does admit to heavy alcohol use. He drinks daily; 2 bottles of wine or more. Last drink was last night. He also uses cocaine daily; 1 gram. Last use was 2 days ago. Patient denies current withdrawal symptoms.  Denies seizures. He has a history of blackouts.   Patient has not history of abuse. His support system consist of his parents. Patient is homeless but sometimes will sleep in hotels with various people. Patient has a GED and sts that he is currently employed. Patient is dressed in scrubs. He cooperative but irritable. Patient is alert. His thought process is appropriate. Insight and Judgement are both poor. Appetite is poor with 20 pounds in weight loss in 6 months. Patient reports sleeping 5 hrs per night.   Diagnosis: Schizophrenia, Bipolar I Disorder, Depression  Past Medical History:  Past Medical History  Diagnosis Date  . Seizures ([place])   . Hypertension   . Depression   . Pancreatitis   . Liver cirrhosis ([place])   . Coronary artery disease   . Angina   . Shortness of breath   . Headache(784.0)   . Peripheral vascular disease [facility]) April 2011    Left Pop  . Hypercholesteremia   . Schizophrenia ([place])   . Bipolar 1 disorder ([place])   . Cancer of kidney ([place]) dx'd 04/2013    lt nephrectomy  . Breast CA ([place]) dx'd 2009    bil w/ bil masectomy and oral meds  . H/O suicide attempt 2015    overdose    Past Surgical History  Procedure Laterality Date  . Chest surgery    . Left leg surgery    . Mastectomy    . Left kidney removal    . Breast surgery      Family History:  Family History  Problem Relation Age of Onset  . Stroke Other   . Cancer  Other   . Hyperlipidemia Mother   . Hypertension Mother     Social History:  reports that he has been smoking.  He does not have any smokeless tobacco history on file. He reports that he drinks alcohol. He reports that he does not use illicit drugs.  Additional Social History:  Alcohol / Drug Use Pain Medications: SEE MAR Prescriptions: SEE MAR Over the Counter: SEE MAR History of alcohol / drug use?: Yes Longest period of sobriety (when/how long): 1 month Negative Consequences of Use: Financial, Personal relationships Substance  #1 Name of Substance 1: Alcohol  1 - Age of First Use: 13 yr sold  1 - Amount (size/oz): 5 bottles of wine 1 - Frequency: daily  1 - Duration: 16 yrs  1 - Last Use / Amount: 4/5//2017; 2 bottles of wine  Substance #2 Name of Substance 2: Crack Cocaine  2 - Age of First Use: 35 yrs old  2 - Amount (size/oz): one gram per use  2 - Frequency: daily  2 - Duration: on-going  2 - Last Use / Amount: 10/21/2015  CIWA: CIWA-Ar BP: 120/76 mmHg Pulse Rate: 93 Nausea and Vomiting: 3 Tactile Disturbances: none Tremor: three Auditory Disturbances: not present Paroxysmal Sweats: barely perceptible sweating, palms moist Visual Disturbances: not present Anxiety: three Headache, Fullness in Head: none present Agitation: two Orientation and Clouding of Sensorium: oriented and can do serial additions CIWA-Ar Total: 12 COWS:    Allergies:  Allergies  Allergen Reactions  . Codeine Hives, Itching and Swelling  . Penicillins Swelling    Has patient had a PCN reaction causing immediate rash, facial/tongue/throat swelling, SOB or lightheadedness with hypotension: Yes Has patient had a PCN reaction causing severe rash involving mucus membranes or skin necrosis: Yes Has patient had a PCN reaction that required hospitalization Yes-ed visit Has patient had a PCN reaction occurring within the last 10 years: Yes If all of the above answers are "NO", then may proceed with Cephalosporin use.   . Depakote [Divalproex Sodium] Hives and Other (See Comments)    "Bug out and hallucinate"  . Morphine Itching  . Coconut Flavor Itching and Swelling  . Depakote Er [Divalproex Sodium Er] Hives  . Oxycodone Itching and Swelling  . Hydrocodone-Acetaminophen Itching and Rash    Home Medications:  (Not in a hospital admission)  OB/GYN Status:  No LMP for male patient.  General Assessment Data [facility] of Assessment: [facility] TTS Assessment: In system Is this a Tele or Face-to-Face Assessment?: Face-to-Face Is  this an Initial Assessment or a Re-assessment for this encounter?: Initial Assessment Marital status: Single Maiden name:  (n/a) Is patient pregnant?: No Pregnancy Status: No Living Arrangements: Other (Comment) (homeless; "I live with people..in hotels") Can pt return to current living arrangement?: Yes Admission Status: Voluntary Is patient capable of signing voluntary admission?: Yes Referral Source: Self/Family/Friend Insurance type:  (Self Pay )     Crisis Care Plan Living Arrangements: Other (Comment) (homeless; "I live with people..in hotels") Name of Psychiatrist: Beverly Sessions Name of Therapist: Beverly Sessions ([facility])  Education Status Is patient currently in school?: Yes Highest grade of school patient has completed: GED  Risk to self with the past 6 months Suicidal Ideation: Yes-Currently Present Has patient been a risk to self within the past 6 months prior to admission? : Yes Suicidal Intent: No Has patient had any suicidal intent within the past 6 months prior to admission? : Yes Is patient at risk for suicide?: Yes Suicidal  Plan?: Yes-Currently Present (overdose) Has patient had any suicidal plan within the past 6 months prior to admission? : Yes Specify Current Suicidal Plan:  (overdose ) Access to Means: Yes Specify Access to Suicidal Means:  (Benadryl and alcohol) What has been your use of drugs/alcohol within the last 12 months?:  (alcohol and cocaine) Previous Attempts/Gestures: Yes How many times?:  (11x's-Overdoses and cutting self ) Other Self Harm Risks:  (history of cutting ) Triggers for Past Attempts: Unpredictable Intentional Self Injurious Behavior: Cutting Comment - Self Injurious Behavior:  (last cut one month ago ) Family Suicide History: Unknown Recent stressful life event(s): Other (Comment) ("It's just stress"; pt would not elaborate ) Persecutory voices/beliefs?: No Depression: Yes Depression Symptoms: Feeling  angry/irritable, Feeling worthless/self pity, Loss of interest in usual pleasures, Guilt, Fatigue, Isolating, Tearfulness, Insomnia, Despondent Substance abuse history and/or treatment for substance abuse?: No Suicide prevention information given to non-admitted patients: Yes  Risk to Others within the past 6 months Homicidal Ideation: No Does patient have any lifetime risk of violence toward others beyond the six months prior to admission? : No Thoughts of Harm to Others: No Current Homicidal Intent: No Current Homicidal Plan: No Access to Homicidal Means: No Identified Victim:  (n/a) History of harm to others?: No Assessment of Violence: None Noted Violent Behavior Description:  (patient frustrated and angry ) Does patient have access to weapons?: No Criminal Charges Pending?: Yes Describe Pending Criminal Charges:  (shoplifting ) Does patient have a court date: Yes Court Date:  (11/03/2015) Is patient on probation?: No  Psychosis Hallucinations: None noted Delusions: None noted  Mental Status Report Appearance/Hygiene: In scrubs Eye Contact: Good Motor Activity: Freedom of movement Speech: Logical/coherent Level of Consciousness: Alert Mood: Depressed Affect: Depressed Anxiety Level: None Thought Processes: Coherent, Relevant Judgement: Partial Orientation: Person, Place, Time, Situation, Appropriate for developmental age Obsessive Compulsive Thoughts/Behaviors: None  Cognitive Functioning Concentration: Poor Memory: Recent Intact, Remote Intact IQ: Average Insight: Good Impulse Control: Poor Appetite: Fair Weight Loss:  (30 pounds in 6 months ) Weight Gain:  (no weight gain reported) Sleep:  ("I sleep when Im drunk") Total Hours of Sleep:  (5 hrs per sleep) Vegetative Symptoms: None  ADLScreening [facility] Assessment Services) [name] cognitive ability adequate to safely complete daily activities?: Yes Patient able to express need for assistance with ADLs?:  Yes Independently performs ADLs?: Yes (appropriate for developmental age)  Prior Inpatient Therapy Prior Inpatient Therapy: Yes Prior Therapy Dates: multiple Prior Therapy Facilty/Provider(s): multiple Reason for Treatment: SI/SA  Prior Outpatient Therapy Prior Outpatient Therapy: Yes Prior Therapy Facilty/Provider(s): [facility]rest[facility] and [facility]) Reason for Treatment: Depression Does patient have an ACCT team?: No Does patient have Intensive In-House Services?  : No Does patient have [facility] services? : Yes Does patient have P4[facility] services?: No  ADL Screening (condition at time of admission) [name] cognitive ability adequate to safely complete daily activities?: Yes Is the patient deaf or have difficulty hearing?: No Does the patient have difficulty seeing, even when wearing glasses/contacts?: No Does the patient have difficulty concentrating, remembering, or making decisions?: No Patient able to express need for assistance with ADLs?: Yes Does the patient have difficulty dressing or bathing?: No Independently performs ADLs?: Yes (appropriate for developmental age) Does the patient have difficulty walking or climbing stairs?: No Weakness of Legs: None Weakness of Arms/Hands: None  Home Assistive Devices/Equipment Home Assistive Devices/Equipment: None    Abuse/Neglect Assessment (Assessment to be complete while patient is alone) Physical Abuse: Denies  Verbal Abuse: Denies Sexual Abuse: Denies Exploitation of patient/patient's resources: Denies Self-Neglect: Denies Values / Beliefs Cultural Requests During Hospitalization: None Spiritual Requests During Hospitalization: None   Advance Directives (For Healthcare) Does patient have an advance directive?: No Would patient like information on creating an advanced directive?: No - patient declined information Nutrition Screen- [facility] Adult/[facility] [name] home diet: Regular  Additional  Information 1:1 In Past 12 Months?: No CIRT Risk: No Elopement Risk: No Does patient have medical clearance?: No     Disposition:  Dr. Darleene Cleaver and Reginold Agent, NP recommend [facility] OBS admission.     On Site Evaluation by:   Reviewed with Physician:    Waldon Merl [facility] 10/23/2015 11:08 AM
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
## 2015-10-23 NOTE — Progress Notes (Signed)
Pt inquired about medication resources for cheaper medications   CM discussed and provided written information for uninsured accepting pcps, discussed the importance of pcp vs EDP services for f/u care, www.needymeds.org, www.goodrx.com, discounted pharmacies and other [employer] such as [employer], affordable care act, financial assistance, uninsured dental services, [place] med assist, DSS and  health department  Reviewed resources for [employer] uninsured accepting pcps like [facility], family medicine at [employer], [facility], [facility], local urgent care centers, [facility], [facility] family practice, general medical clinics, family services of the [place], [facility] urgent care plus others, medication resources, [facility] out patient pharmacies and housing Pt voiced understanding and appreciation of resources provided  Encouraged use of goodrx, needymeds.org  and  medassist

## 2015-10-23 NOTE — ED Notes (Signed)
Bed: [facility] Expected date:  Expected time:  Means of arrival:  Comments: EMS 35 yo male from home/disoriented to events, SI/ETOH

## 2015-10-23 NOTE — ED Provider Notes (Signed)
CSN: [id number]     Arrival date & time 10/23/15  0228 History   First MD Initiated Contact with Patient 10/23/15 [phone]     Chief Complaint  Patient presents with  . Suicidal     (Consider location/radiation/quality/duration/timing/severity/associated sxs/prior Treatment) HPI Comments: 35 year old male with a history of cirrhosis, CAD, hyperlipidemia, hypertension, schizophrenia, and bipolar disorder presents to the emergency department for evaluation of suicidal ideations. Patient requesting psychiatric evaluation. He is visibly intoxicated with slurred speech. He expresses plan to kill himself by overdose. Patient has been drinking excessive amounts of liquor this evening and reports using crack cocaine. No homicidal ideations.  The history is provided by the patient. No language interpreter was used.    Past Medical History  Diagnosis Date  . Seizures ([place])   . Hypertension   . Depression   . Pancreatitis   . Liver cirrhosis ([place])   . Coronary artery disease   . Angina   . Shortness of breath   . Headache(784.0)   . Peripheral vascular disease [facility]) April 2011    Left Pop  . Hypercholesteremia   . Schizophrenia ([place])   . Bipolar 1 disorder ([place])   . Cancer of kidney ([place]) dx'd 04/2013    lt nephrectomy  . Breast CA ([place]) dx'd 2009    bil w/ bil masectomy and oral meds  . H/O suicide attempt 2015    overdose   Past Surgical History  Procedure Laterality Date  . Chest surgery    . Left leg surgery    . Mastectomy    . Left kidney removal    . Breast surgery     Family History  Problem Relation Age of Onset  . Stroke Other   . Cancer Other   . Hyperlipidemia Mother   . Hypertension Mother    Social History  Substance Use Topics  . Smoking status: Current Every Day Smoker  . Smokeless tobacco: Not on file  . Alcohol Use: 0.0 oz/week    Review of Systems  Psychiatric/Behavioral: Positive for suicidal ideas and behavioral problems.  All other systems reviewed  and are negative.   Allergies  Codeine; Penicillins; Depakote; Morphine; Coconut flavor; Depakote er; Oxycodone; and Hydrocodone-acetaminophen  Home Medications   Prior to Admission medications   Medication Sig Start Date End Date Taking? Authorizing Provider  amLODipine (NORVASC) 5 MG tablet Take 1 tablet (5 mg total) by mouth daily. 10/09/15  Yes Benjamine Mola, FNP  ARIPiprazole (ABILIFY) 10 MG tablet Take 1 tablet (10 mg total) by mouth daily. 10/09/15  Yes Benjamine Mola, FNP  atorvastatin (LIPITOR) 20 MG tablet Take 1 tablet (20 mg total) by mouth every morning. 10/09/15  Yes Benjamine Mola, FNP  DULoxetine (CYMBALTA) 30 MG capsule Take 1 capsule (30 mg total) by mouth daily. 10/09/15  Yes Benjamine Mola, FNP  hydrOXYzine (ATARAX/VISTARIL) 25 MG tablet Take 1 tablet (25 mg total) by mouth every 6 (six) hours as needed. 10/09/15  Yes Benjamine Mola, FNP  metoprolol tartrate (LOPRESSOR) 25 MG tablet Take 1 tablet (25 mg total) by mouth 2 (two) times daily. 10/09/15  Yes Benjamine Mola, FNP  traZODone (DESYREL) 100 MG tablet Take 1 tablet (100 mg total) by mouth at bedtime. 10/09/15  Yes Elyse Jarvis [place], FNP   BP 120/76 mmHg  Pulse 93  Temp(Src) 97.4 F (36.3 C) (Oral)  Resp 18  Ht 5\' 5"  (1.651 m)  Wt 68.04 kg  BMI 24.96 kg/m2  SpO2 98%   Physical Exam  Constitutional: He is oriented to person, place, and time. He appears well-developed and well-nourished. No distress.  Patient is visibly intoxicated.  HENT:  Head: Normocephalic and atraumatic.  Eyes: Conjunctivae and EOM are normal. No scleral icterus.  Neck: Normal range of motion.  Pulmonary/Chest: Effort normal. No respiratory distress.  Musculoskeletal: Normal range of motion.  Neurological: He is alert and oriented to person, place, and time. He exhibits normal muscle tone. Coordination normal.  Skin: Skin is warm and dry. No rash noted. He is not diaphoretic. No erythema. No pallor.  Psychiatric: He has a normal mood and  affect. His speech is slurred. He is slowed. He expresses suicidal ideation. He expresses no homicidal ideation. He expresses suicidal plans. He expresses no homicidal plans.  Nursing note and vitals reviewed.   ED Course  Procedures (including critical care time) Labs Review Labs Reviewed  COMPREHENSIVE METABOLIC PANEL - Abnormal; Notable for the following:    Glucose, Bld 111 (*)    AST 63 (*)    All other components within normal limits  ETHANOL - Abnormal; Notable for the following:    Alcohol, Ethyl (B) 418 (*)    All other components within normal limits  ACETAMINOPHEN LEVEL - Abnormal; Notable for the following:    Acetaminophen (Tylenol), Serum <10 (*)    All other components within normal limits  URINE RAPID DRUG SCREEN, HOSP PERFORMED - Abnormal; Notable for the following:    Cocaine POSITIVE (*)    All other components within normal limits  SALICYLATE LEVEL  CBC    Imaging Review No results found.   I have personally reviewed and evaluated these images and lab results as part of my medical decision-making.   EKG Interpretation None      3:02 AM Patient visibly intoxicated. He is expressing suicidal ideations, but is too drunk for TTS evaluation. We will reassess when sober and evaluate whether the patient remained suicidal prior to placing TTS consult.  5:11 AM Patient still intoxicated, but slurring has lessened. Patient continues to report SI. States he has thoughts of cutting himself to kill himself. Will involve TTS at this point for psych eval.  MDM   Final diagnoses:  Alcohol-induced mood disorder ([place])  Suicidal ideations    Patient with BAC of 418; presenting intoxicated c/o SI. Hx of visits for same. Patient otherwise medically cleared. Patient pending TTS evaluation. Disposition to be determined by oncoming ED provider.   Filed Vitals:   10/23/15 0235 10/23/15 0519  BP: 150/91 120/76  Pulse: 102 93  Temp: 97.5 F (36.4 C) 97.4 F (36.3 C)   TempSrc: Oral Oral  Resp: 18 18  Height: 5\' 5"  (1.651 m)   Weight: 68.04 kg   SpO2: 99% 98%     Antonietta Breach, PA-C 10/23/15 [id number]  Orpah Greek, MD 10/23/15 [phone]

## 2015-10-23 NOTE — ED Notes (Signed)
Patient denies SI, HI, thoughts of self-harm. Patient ambulating steadily in hallway.

## 2015-10-23 NOTE — ED Notes (Signed)
Patient tolerating PO fluids without difficulty.

## 2015-10-23 NOTE — Discharge Instructions (Signed)
°Stress and Stress Management °Stress is a normal reaction to life events. It is what you feel when life demands more than you are used to or more than you can handle. Some stress can be useful. For example, the stress reaction can help you catch the last bus of the day, study for a test, or meet a deadline at work. But stress that occurs too often or for too long can cause problems. It can affect your emotional health and interfere with relationships and normal daily activities. Too much stress can weaken your immune system and increase your risk for physical illness. If you already have a medical problem, stress can make it worse. °CAUSES  °All sorts of life events may cause stress. An event that causes stress for one person may not be stressful for another person. Major life events commonly cause stress. These may be positive or negative. Examples include losing your job, moving into a new home, getting married, having a baby, or losing a loved one. Less obvious life events may also cause stress, especially if they occur day after day or in combination. Examples include working long hours, driving in traffic, caring for children, being in debt, or being in a difficult relationship. °SIGNS AND SYMPTOMS °Stress may cause emotional symptoms including, the following: °· Anxiety. This is feeling worried, afraid, on edge, overwhelmed, or out of control. °· Anger. This is feeling irritated or impatient. °· Depression. This is feeling sad, down, helpless, or guilty. °· Difficulty focusing, remembering, or making decisions. °Stress may cause physical symptoms, including the following:  °· Aches and pains. These may affect your head, neck, back, stomach, or other areas of your body. °· Tight muscles or clenched jaw. °· Low energy or trouble sleeping.  °Stress may cause unhealthy behaviors, including the following:  °· Eating to feel better (overeating) or skipping meals. °· Sleeping too little, too much, or  both. °· Working too much or putting off tasks (procrastination). °· Smoking, drinking alcohol, or using drugs to feel better. °DIAGNOSIS  °Stress is diagnosed through an assessment by your health care provider. Your health care provider will ask questions about your symptoms and any stressful life events. Your health care provider will also ask about your medical history and may order blood tests or other tests. Certain medical conditions and medicine can cause physical symptoms similar to stress.  Mental illness can cause emotional symptoms and unhealthy behaviors similar to stress. Your health care provider may refer you to a mental health professional for further evaluation.  °TREATMENT  °Stress management is the recommended treatment for stress. The goals of stress management are reducing stressful life events and coping with stress in healthy ways.  °Techniques for reducing stressful life events include the following: °· Stress identification. Self-monitor for stress and identify what causes stress for you. These skills may help you to avoid some stressful events. °· Time management. Set your priorities, keep a calendar of events, and learn to say "no." These tools can help you avoid making too many commitments. °Techniques for coping with stress include the following: °· Rethinking the problem. Try to think realistically about stressful events rather than ignoring them or overreacting. Try to find the positives in a stressful situation rather than focusing on the negatives. °· Exercise. Physical exercise can release both physical and emotional tension. The key is to find a form of exercise you enjoy and do it regularly. °· Relaxation techniques. These relax the body and mind. Examples include yoga, meditation, tai chi, biofeedback, deep   breathing, progressive muscle relaxation, listening to music, being out in nature, journaling, and other hobbies. Again, the key is to find one or more that you enjoy and can do  regularly.  Healthy lifestyle. Eat a balanced diet, get plenty of sleep, and do not smoke. Avoid using alcohol or drugs to relax.  Strong support network. Spend time with family, friends, or other people you enjoy being around.Express your feelings and talk things over with someone you trust. Counseling or talktherapy with a mental health professional may be helpful if you are having difficulty managing stress on your own. Medicine is typically not recommended for the treatment of stress.Talk to your health care provider if you think you need medicine for symptoms of stress. HOME CARE INSTRUCTIONS  Keep all follow-up visits as directed by your health care provider.  Take all medicines as directed by your health care provider. SEEK MEDICAL CARE IF:  Your symptoms get worse or you start having new symptoms.  You feel overwhelmed by your problems and can no longer manage them on your own. SEEK IMMEDIATE MEDICAL CARE IF:  You feel like hurting yourself or someone else.   This information is not intended to replace advice given to you by your health care provider. Make sure you discuss any questions you have with your health care provider.   Document Released: 12/29/2000 Document Revised: 07/26/2014 Document Reviewed: 02/27/2013 Elsevier Interactive Patient Education 2016 [employer]  

## 2015-10-23 NOTE — BH Assessment (Signed)
Received a request for a TTS consult; however this writer was unable to complete the assessment due to pt being drowsy. Pt will be assess when pt is alert and able to respond to questions.

## 2015-10-23 NOTE — Progress Notes (Signed)
CM assisted pt to call his mother. He informed her he had a seizure and was being placed on dilantin Mother coming to pick pt up

## 2015-10-23 NOTE — ED Notes (Signed)
Patient wants to kill himself. He said he tried to take a lot of pills. He has had a lot of alcohol tonight.

## 2015-10-26 DIAGNOSIS — F10229 Alcohol dependence with intoxication, unspecified: Secondary | ICD-10-CM | POA: Insufficient documentation

## 2015-10-26 DIAGNOSIS — F609 Personality disorder, unspecified: Secondary | ICD-10-CM | POA: Insufficient documentation

## 2015-10-26 DIAGNOSIS — F1090 Alcohol use, unspecified, uncomplicated: Secondary | ICD-10-CM | POA: Insufficient documentation

## 2015-11-26 ENCOUNTER — Encounter (HOSPITAL_COMMUNITY): Payer: Self-pay | Admitting: Emergency Medicine

## 2015-11-26 ENCOUNTER — Emergency Department (HOSPITAL_COMMUNITY): Payer: Self-pay

## 2015-11-26 ENCOUNTER — Emergency Department (HOSPITAL_COMMUNITY)
Admission: EM | Admit: 2015-11-26 | Discharge: 2015-11-26 | Disposition: A | Discharge status: Home / Self Care | Payer: Self-pay | Attending: Emergency Medicine | Admitting: Emergency Medicine

## 2015-11-26 DIAGNOSIS — Z880 Allergy status to penicillin: Secondary | ICD-10-CM | POA: Insufficient documentation

## 2015-11-26 DIAGNOSIS — Z9189 Other specified personal risk factors, not elsewhere classified: Secondary | ICD-10-CM | POA: Insufficient documentation

## 2015-11-26 DIAGNOSIS — I25119 Atherosclerotic heart disease of native coronary artery with unspecified angina pectoris: Secondary | ICD-10-CM | POA: Insufficient documentation

## 2015-11-26 DIAGNOSIS — F209 Schizophrenia, unspecified: Secondary | ICD-10-CM | POA: Insufficient documentation

## 2015-11-26 DIAGNOSIS — F10230 Alcohol dependence with withdrawal, uncomplicated: Secondary | ICD-10-CM | POA: Insufficient documentation

## 2015-11-26 DIAGNOSIS — F319 Bipolar disorder, unspecified: Secondary | ICD-10-CM | POA: Insufficient documentation

## 2015-11-26 DIAGNOSIS — R51 Headache: Secondary | ICD-10-CM | POA: Insufficient documentation

## 2015-11-26 DIAGNOSIS — Z8719 Personal history of other diseases of the digestive system: Secondary | ICD-10-CM | POA: Insufficient documentation

## 2015-11-26 DIAGNOSIS — Z79899 Other long term (current) drug therapy: Secondary | ICD-10-CM | POA: Insufficient documentation

## 2015-11-26 DIAGNOSIS — Z85528 Personal history of other malignant neoplasm of kidney: Secondary | ICD-10-CM | POA: Insufficient documentation

## 2015-11-26 DIAGNOSIS — F17200 Nicotine dependence, unspecified, uncomplicated: Secondary | ICD-10-CM | POA: Insufficient documentation

## 2015-11-26 DIAGNOSIS — R109 Unspecified abdominal pain: Secondary | ICD-10-CM | POA: Insufficient documentation

## 2015-11-26 DIAGNOSIS — I10 Essential (primary) hypertension: Secondary | ICD-10-CM | POA: Insufficient documentation

## 2015-11-26 DIAGNOSIS — R079 Chest pain, unspecified: Secondary | ICD-10-CM | POA: Insufficient documentation

## 2015-11-26 DIAGNOSIS — F419 Anxiety disorder, unspecified: Secondary | ICD-10-CM | POA: Insufficient documentation

## 2015-11-26 DIAGNOSIS — Z853 Personal history of malignant neoplasm of breast: Secondary | ICD-10-CM | POA: Insufficient documentation

## 2015-11-26 DIAGNOSIS — F10930 Alcohol use, unspecified with withdrawal, uncomplicated: Secondary | ICD-10-CM

## 2015-11-26 DIAGNOSIS — R251 Tremor, unspecified: Secondary | ICD-10-CM | POA: Insufficient documentation

## 2015-11-26 DIAGNOSIS — E7800 Pure hypercholesterolemia, unspecified: Secondary | ICD-10-CM | POA: Insufficient documentation

## 2015-11-26 LAB — CBC
HCT: 43.5 % (ref 39.0–52.0)
HEMOGLOBIN: 15.3 g/dL (ref 13.0–17.0)
MCH: 31.8 pg (ref 26.0–34.0)
MCHC: 35.2 g/dL (ref 30.0–36.0)
MCV: 90.4 fL (ref 78.0–100.0)
PLATELETS: 218 10*3/uL (ref 150–400)
RBC: 4.81 MIL/uL (ref 4.22–5.81)
RDW: 13.8 % (ref 11.5–15.5)
WBC: 9.3 10*3/uL (ref 4.0–10.5)

## 2015-11-26 LAB — COMPREHENSIVE METABOLIC PANEL
ALBUMIN: 3.9 g/dL (ref 3.5–5.0)
ALK PHOS: 38 U/L (ref 38–126)
ALT: 25 U/L (ref 17–63)
ANION GAP: 16 — AB (ref 5–15)
AST: 44 U/L — ABNORMAL HIGH (ref 15–41)
BILIRUBIN TOTAL: 1 mg/dL (ref 0.3–1.2)
BUN: <5 mg/dL — ABNORMAL LOW (ref 6–20)
CHLORIDE: 104 mmol/L (ref 101–111)
CO2: 20 mmol/L — AB (ref 22–32)
Calcium: 9.6 mg/dL (ref 8.9–10.3)
Creatinine, Ser: 0.89 mg/dL (ref 0.61–1.24)
GFR calc non Af Amer: >60 mL/min (ref >60)
GLUCOSE: 133 mg/dL — AB (ref 65–99)
Potassium: 3.8 mmol/L (ref 3.5–5.1)
Sodium: 140 mmol/L (ref 135–145)
Total Protein: 6.8 g/dL (ref 6.5–8.1)

## 2015-11-26 LAB — RAPID URINE DRUG SCREEN, HOSP PERFORMED
AMPHETAMINES: NOT DETECTED
BARBITURATES: NOT DETECTED
Benzodiazepines: POSITIVE — AB
COCAINE: POSITIVE — AB
OPIATES: NOT DETECTED
TETRAHYDROCANNABINOL: NOT DETECTED

## 2015-11-26 LAB — LIPASE, BLOOD: Lipase: 29 U/L (ref 11–51)

## 2015-11-26 LAB — I-STAT TROPONIN, ED: TROPONIN I, POC: 0 ng/mL (ref 0.00–0.08)

## 2015-11-26 LAB — ETHANOL: Alcohol, Ethyl (B): 13 mg/dL — ABNORMAL HIGH (ref <5)

## 2015-11-26 MED ORDER — ONDANSETRON 4 MG PO TBDP
ORAL_TABLET | ORAL | Status: AC
  Filled 2015-11-26: qty 1

## 2015-11-26 MED ORDER — LORAZEPAM 2 MG/ML IJ SOLN
1.0000 mg | Freq: Once | INTRAMUSCULAR | Status: AC
  Administered 2015-11-26: 1 mg via INTRAVENOUS
  Filled 2015-11-26: qty 1

## 2015-11-26 MED ORDER — ONDANSETRON HCL 4 MG/2ML IJ SOLN
4.0000 mg | Freq: Once | INTRAMUSCULAR | Status: AC
  Administered 2015-11-26: 4 mg via INTRAVENOUS
  Filled 2015-11-26: qty 2

## 2015-11-26 MED ORDER — LORAZEPAM 1 MG PO TABS: ORAL_TABLET | ORAL | Status: DC

## 2015-11-26 MED ORDER — SODIUM CHLORIDE 0.9 % IV BOLUS (SEPSIS)
1000.0000 mL | Freq: Once | INTRAVENOUS | Status: AC
  Administered 2015-11-26: 1000 mL via INTRAVENOUS

## 2015-11-26 MED ORDER — ONDANSETRON 4 MG PO TBDP
4.0000 mg | ORAL_TABLET | Freq: Once | ORAL | Status: AC | PRN
  Administered 2015-11-26: 4 mg via ORAL

## 2015-11-26 NOTE — Discharge Instructions (Signed)
Alcohol Withdrawal  When a person who drinks a lot of alcohol stops drinking, he or she may go through alcohol withdrawal. Alcohol withdrawal causes problems. It can make you feel:  · Tired (fatigued).  · Sad (depressed).  · Fearful (anxious).  · Grouchy (irritable).  · Not hungry.  · Sick to your stomach (nauseous).  · Shaky.  It can also make you have:  · Nightmares.  · Trouble sleeping.  · Trouble thinking clearly.  · Mood swings.  · Clammy skin.  · Very bad sweating.  · A very fast heartbeat.  · Shaking that you cannot control (tremor).  · Having a fever.  · A fit of movements that you cannot control (seizure).  · Confusion.  · Throwing up (vomiting).  · Feeling or seeing things that are not there (hallucinations).  HOME CARE  · Take medicines and vitamins only as told by your doctor.  · Do not drink alcohol.  · Have someone around in case you need help.  · Drink enough fluid to keep your pee (urine) clear or pale yellow.  · Think about joining a group to help you stop drinking.  GET HELP IF:  · Your problems get worse.  · Your problems do not go away.  · You cannot eat or drink without throwing up.  · You are having a hard time not drinking alcohol.  · You cannot stop drinking alcohol.  GET HELP RIGHT AWAY IF:  · You feel your heart beating differently than usual.  · Your chest hurts.  · You have trouble breathing.  · You have very bad problems, like:    A fever.    A fit of movements that you cannot control.    Being very confused.    Feeling or seeing things that are not there.     This information is not intended to replace advice given to you by your health care provider. Make sure you discuss any questions you have with your health care provider.     Document Released: 12/22/2007 Document Revised: 07/26/2014 Document Reviewed: 04/23/2014  Elsevier Interactive Patient Education ©2016 [employer] Inc[employer]

## 2015-11-26 NOTE — ED Provider Notes (Signed)
CSN: [id number]     Arrival date & time 11/26/15  0945 History   First MD Initiated Contact with Patient 11/26/15 1014     Chief Complaint  Patient presents with  . Withdrawal    Patient is a 35 y.o. male presenting with mental health disorder. The history is provided by the patient.  Mental Health Problem Presenting symptoms: no suicidal thoughts   Presenting symptoms comment:  ETOH withdrawal  Degree of incapacity (severity):  Severe Onset quality:  Sudden Duration:  1 day Timing:  Constant Progression:  Worsening Chronicity:  New Context: alcohol use and drug abuse   Relieved by:  Nothing Worsened by:  Nothing tried Associated symptoms: abdominal pain, anxiety, chest pain and headaches   Risk factors: hx of mental illness   Patient with h/o substance abuse, h/o seizures, bipolar disorder presents with alcohol withdrawal He reports he has drank 2-3 bottles of wine/day for 2 yrs He reports last drink was yesterday and since that time he has had diarrhea, vomiting, diffuse body pain, headaches and tremors He reports h/o seizures from ETOH withdrawal in the past He denies SI   Past Medical History  Diagnosis Date  . Seizures ([place])   . Hypertension   . Depression   . Pancreatitis   . Liver cirrhosis ([place])   . Coronary artery disease   . Angina   . Shortness of breath   . Headache(784.0)   . Peripheral vascular disease [facility]) April 2011    Left Pop  . Hypercholesteremia   . Schizophrenia ([place])   . Bipolar 1 disorder ([place])   . Cancer of kidney ([place]) dx'd 04/2013    lt nephrectomy  . Breast CA ([place]) dx'd 2009    bil w/ bil masectomy and oral meds  . H/O suicide attempt 2015    overdose   Past Surgical History  Procedure Laterality Date  . Chest surgery    . Left leg surgery    . Mastectomy    . Left kidney removal    . Breast surgery     Family History  Problem Relation Age of Onset  . Stroke Other   . Cancer Other   . Hyperlipidemia Mother   . Hypertension  Mother    Social History  Substance Use Topics  . Smoking status: Current Every Day Smoker  . Smokeless tobacco: None  . Alcohol Use: 0.0 oz/week    Review of Systems  Constitutional: Negative for fever.  Cardiovascular: Positive for chest pain.  Gastrointestinal: Positive for nausea, vomiting, abdominal pain and diarrhea.  Neurological: Positive for tremors and headaches.  Psychiatric/Behavioral: Negative for suicidal ideas. The patient is nervous/anxious.   All other systems reviewed and are negative.     Allergies  Codeine; Penicillins; Depakote; Morphine; Coconut flavor; Depakote er; Oxycodone; and Hydrocodone-acetaminophen  Home Medications   Prior to Admission medications   Medication Sig Start Date End Date Taking? Authorizing Provider  amLODipine (NORVASC) 5 MG tablet Take 1 tablet (5 mg total) by mouth daily. 10/09/15   Benjamine Mola, FNP  ARIPiprazole (ABILIFY) 10 MG tablet Take 1 tablet (10 mg total) by mouth daily. 10/09/15   Benjamine Mola, FNP  atorvastatin (LIPITOR) 20 MG tablet Take 1 tablet (20 mg total) by mouth every morning. 10/09/15   Benjamine Mola, FNP  DULoxetine (CYMBALTA) 30 MG capsule Take 1 capsule (30 mg total) by mouth daily. 10/09/15   Benjamine Mola, FNP  hydrOXYzine (ATARAX/VISTARIL) 25 MG tablet Take 1 tablet (  25 mg total) by mouth every 6 (six) hours as needed. 10/09/15   Benjamine Mola, FNP  metoprolol tartrate (LOPRESSOR) 25 MG tablet Take 1 tablet (25 mg total) by mouth 2 (two) times daily. 10/09/15   Benjamine Mola, FNP  traZODone (DESYREL) 100 MG tablet Take 1 tablet (100 mg total) by mouth at bedtime. 10/09/15   Elyse Jarvis Withrow, FNP   BP 140/98 mmHg  Pulse 87  Temp(Src) 97.6 F (36.4 C) (Oral)  Resp 14  SpO2 100% Physical Exam CONSTITUTIONAL: Disheveled, anxious HEAD: Normocephalic/atraumatic EYES: EOMI/PERRL ENMT: Mucous membranes dry NECK: supple no meningeal signs SPINE/BACK:entire spine nontender CV: S1/S2 noted, no  murmurs/rubs/gallops noted LUNGS: Lungs are clear to auscultation bilaterally, no apparent distress ABDOMEN: soft, nontender, no rebound or guarding, bowel sounds noted throughout abdomen NEURO: Pt is awake/alert/appropriate, moves all extremitiesx4.  No facial droop.  No arm/leg drift.  No ataxia EXTREMITIES: pulses normal/equal, full ROM SKIN: warm, color normal PSYCH:anxious   ED Course  Procedures   Medications  ondansetron (ZOFRAN-ODT) disintegrating tablet 4 mg (4 mg Oral Given 11/26/15 1003)  ondansetron (ZOFRAN) injection 4 mg (4 mg Intravenous Given 11/26/15 1058)  LORazepam (ATIVAN) injection 1 mg (1 mg Intravenous Given 11/26/15 1059)  sodium chloride 0.9 % bolus 1,000 mL (0 mLs Intravenous Stopped 11/26/15 1429)  sodium chloride 0.9 % bolus 1,000 mL (0 mLs Intravenous Stopped 11/26/15 1429)  LORazepam (ATIVAN) injection 1 mg (1 mg Intravenous Given 11/26/15 1227)    11:31 AM Pt here for ETOH withdrawal IV fluids and ativan ordered 2:53 PM Pt improved He is resting comfortably Vitals improved CIWA score improved No vomiting I feel he is safe for d/c  Will give ativan taper I feel he is low risk for acute complication He did not voice SI Referred to [facility] for behavioral health BP 127/96 mmHg  Pulse 103  Temp(Src) 97.6 F (36.4 C) (Oral)  Resp 17  SpO2 97%   Labs Review Labs Reviewed  COMPREHENSIVE METABOLIC PANEL - Abnormal; Notable for the following:    CO2 20 (*)    Glucose, Bld 133 (*)    BUN <5 (*)    AST 44 (*)    Anion gap 16 (*)    All other components within normal limits  ETHANOL - Abnormal; Notable for the following:    Alcohol, Ethyl (B) 13 (*)    All other components within normal limits  URINE RAPID DRUG SCREEN, HOSP PERFORMED - Abnormal; Notable for the following:    Cocaine POSITIVE (*)    Benzodiazepines POSITIVE (*)    All other components within normal limits  CBC  LIPASE, BLOOD  I-STAT TROPOININ, ED    Imaging Review Dg Chest 2  View  11/26/2015  CLINICAL DATA:  Chest pain with vomiting starting this morning. EXAM: CHEST  2 VIEW COMPARISON:  10/07/2015 FINDINGS: Bilateral axillary clips. Postoperative findings along the anterior chest wall. The lungs appear clear. Cardiac and mediastinal contours normal. No pleural effusion identified. IMPRESSION: 1.  No active cardiopulmonary disease is radiographically apparent. Electronically Signed   By: Van Clines M.D.   On: 11/26/2015 10:52   I have personally reviewed and evaluated these images and lab results as part of my medical decision-making.   EKG Interpretation   Date/Time:  Wednesday Nov 26 2015 09:50:05 EDT Ventricular Rate:  85 PR Interval:  142 QRS Duration: 90 QT Interval:  394 QTC Calculation: 468 R Axis:   76 Text Interpretation:  Normal sinus rhythm Normal  ECG No significant change  since last tracing Confirmed by [facility]  MD, Jovann Luse ([id number]) on 11/26/2015  10:15:04 AM     Medications  ondansetron (ZOFRAN-ODT) disintegrating tablet 4 mg (4 mg Oral Given 11/26/15 1003)  ondansetron (ZOFRAN) injection 4 mg (4 mg Intravenous Given 11/26/15 1058)  LORazepam (ATIVAN) injection 1 mg (1 mg Intravenous Given 11/26/15 1059)  sodium chloride 0.9 % bolus 1,000 mL (0 mLs Intravenous Stopped 11/26/15 1429)  sodium chloride 0.9 % bolus 1,000 mL (0 mLs Intravenous Stopped 11/26/15 1429)  LORazepam (ATIVAN) injection 1 mg (1 mg Intravenous Given 11/26/15 1227)    MDM   Final diagnoses:  Alcohol withdrawal, uncomplicated ([place])    Nursing notes including past medical history and social history reviewed and considered in documentation xrays/imaging reviewed by myself and considered during evaluation Labs/vital reviewed myself and considered during evaluation Previous records reviewed and considered      Ripley Fraise, MD 11/26/15 1456

## 2015-11-26 NOTE — ED Notes (Signed)
Pt reports that he started trying to detox of ETOH yesterday and has been having headaches, vomiting and chest pain since. Pt alert x4. Pt reports last drink 24 hours ago.

## 2015-11-27 ENCOUNTER — Encounter (HOSPITAL_COMMUNITY): Payer: Self-pay | Admitting: Emergency Medicine

## 2015-11-27 DIAGNOSIS — Z79899 Other long term (current) drug therapy: Secondary | ICD-10-CM | POA: Insufficient documentation

## 2015-11-27 DIAGNOSIS — Y9289 Other specified places as the place of occurrence of the external cause: Secondary | ICD-10-CM | POA: Insufficient documentation

## 2015-11-27 DIAGNOSIS — F17200 Nicotine dependence, unspecified, uncomplicated: Secondary | ICD-10-CM | POA: Insufficient documentation

## 2015-11-27 DIAGNOSIS — F10120 Alcohol abuse with intoxication, uncomplicated: Secondary | ICD-10-CM | POA: Insufficient documentation

## 2015-11-27 DIAGNOSIS — I25119 Atherosclerotic heart disease of native coronary artery with unspecified angina pectoris: Secondary | ICD-10-CM | POA: Insufficient documentation

## 2015-11-27 DIAGNOSIS — I10 Essential (primary) hypertension: Secondary | ICD-10-CM | POA: Insufficient documentation

## 2015-11-27 DIAGNOSIS — F209 Schizophrenia, unspecified: Secondary | ICD-10-CM | POA: Insufficient documentation

## 2015-11-27 DIAGNOSIS — E7800 Pure hypercholesterolemia, unspecified: Secondary | ICD-10-CM | POA: Insufficient documentation

## 2015-11-27 DIAGNOSIS — Y9389 Activity, other specified: Secondary | ICD-10-CM | POA: Insufficient documentation

## 2015-11-27 DIAGNOSIS — F319 Bipolar disorder, unspecified: Secondary | ICD-10-CM | POA: Insufficient documentation

## 2015-11-27 DIAGNOSIS — T1491 Suicide attempt: Secondary | ICD-10-CM | POA: Insufficient documentation

## 2015-11-27 DIAGNOSIS — W010XXA Fall on same level from slipping, tripping and stumbling without subsequent striking against object, initial encounter: Secondary | ICD-10-CM | POA: Insufficient documentation

## 2015-11-27 DIAGNOSIS — Z8719 Personal history of other diseases of the digestive system: Secondary | ICD-10-CM | POA: Insufficient documentation

## 2015-11-27 DIAGNOSIS — Z85528 Personal history of other malignant neoplasm of kidney: Secondary | ICD-10-CM | POA: Insufficient documentation

## 2015-11-27 DIAGNOSIS — Z880 Allergy status to penicillin: Secondary | ICD-10-CM | POA: Insufficient documentation

## 2015-11-27 DIAGNOSIS — Y998 Other external cause status: Secondary | ICD-10-CM | POA: Insufficient documentation

## 2015-11-27 DIAGNOSIS — Z853 Personal history of malignant neoplasm of breast: Secondary | ICD-10-CM | POA: Insufficient documentation

## 2015-11-27 DIAGNOSIS — Z043 Encounter for examination and observation following other accident: Secondary | ICD-10-CM | POA: Insufficient documentation

## 2015-11-27 LAB — ETHANOL: ALCOHOL ETHYL (B): 325 mg/dL — AB (ref <5)

## 2015-11-27 LAB — CBC
HCT: 41.5 % (ref 39.0–52.0)
Hemoglobin: 14.5 g/dL (ref 13.0–17.0)
MCH: 30.8 pg (ref 26.0–34.0)
MCHC: 34.9 g/dL (ref 30.0–36.0)
MCV: 88.1 fL (ref 78.0–100.0)
PLATELETS: 184 10*3/uL (ref 150–400)
RBC: 4.71 MIL/uL (ref 4.22–5.81)
RDW: 13.4 % (ref 11.5–15.5)
WBC: 5.6 10*3/uL (ref 4.0–10.5)

## 2015-11-27 NOTE — ED Notes (Signed)
Camera operator and staffing notified for pt.'s sitter.

## 2015-11-27 NOTE — ED Notes (Addendum)
Pt. arrived with his mother reports heavy alcohol drinking and took Ativan with alcohol and fell this evening , pt. uncooperative at triage /poor historian , seen here  yesterday morning diagnosed with ETOH withdrawal .

## 2015-11-27 NOTE — ED Notes (Signed)
Pt. attempted to ingest all of his pill , nurse took away his pill bottle for safekeeping .

## 2015-11-28 ENCOUNTER — Inpatient Hospital Stay (HOSPITAL_COMMUNITY)
Admission: AD | Admit: 2015-11-28 | Discharge: 2015-12-03 | DRG: 897 | Disposition: A | Discharge status: Home / Self Care | Payer: No Typology Code available for payment source | Source: Intra-hospital | Attending: Psychiatry | Admitting: Psychiatry

## 2015-11-28 ENCOUNTER — Encounter (HOSPITAL_COMMUNITY): Payer: Self-pay | Admitting: *Deleted

## 2015-11-28 ENCOUNTER — Emergency Department (HOSPITAL_COMMUNITY)
Admission: EM | Admit: 2015-11-28 | Discharge: 2015-11-28 | Disposition: A | Discharge status: Other Acute Inpatient Hospital | Payer: Self-pay | Attending: Emergency Medicine | Admitting: Emergency Medicine

## 2015-11-28 DIAGNOSIS — I10 Essential (primary) hypertension: Secondary | ICD-10-CM | POA: Diagnosis present

## 2015-11-28 DIAGNOSIS — G40909 Epilepsy, unspecified, not intractable, without status epilepticus: Secondary | ICD-10-CM | POA: Diagnosis present

## 2015-11-28 DIAGNOSIS — I739 Peripheral vascular disease, unspecified: Secondary | ICD-10-CM | POA: Diagnosis present

## 2015-11-28 DIAGNOSIS — F410 Panic disorder [episodic paroxysmal anxiety] without agoraphobia: Secondary | ICD-10-CM | POA: Diagnosis present

## 2015-11-28 DIAGNOSIS — K7460 Unspecified cirrhosis of liver: Secondary | ICD-10-CM | POA: Diagnosis present

## 2015-11-28 DIAGNOSIS — I2510 Atherosclerotic heart disease of native coronary artery without angina pectoris: Secondary | ICD-10-CM | POA: Diagnosis present

## 2015-11-28 DIAGNOSIS — F333 Major depressive disorder, recurrent, severe with psychotic symptoms: Secondary | ICD-10-CM | POA: Diagnosis present

## 2015-11-28 DIAGNOSIS — F1024 Alcohol dependence with alcohol-induced mood disorder: Secondary | ICD-10-CM | POA: Diagnosis present

## 2015-11-28 DIAGNOSIS — Z62810 Personal history of physical and sexual abuse in childhood: Secondary | ICD-10-CM | POA: Diagnosis present

## 2015-11-28 DIAGNOSIS — Z905 Acquired absence of kidney: Secondary | ICD-10-CM | POA: Diagnosis not present

## 2015-11-28 DIAGNOSIS — Z85528 Personal history of other malignant neoplasm of kidney: Secondary | ICD-10-CM | POA: Diagnosis not present

## 2015-11-28 DIAGNOSIS — Z823 Family history of stroke: Secondary | ICD-10-CM

## 2015-11-28 DIAGNOSIS — R45851 Suicidal ideations: Secondary | ICD-10-CM

## 2015-11-28 DIAGNOSIS — F0630 Mood disorder due to known physiological condition, unspecified: Secondary | ICD-10-CM | POA: Diagnosis not present

## 2015-11-28 DIAGNOSIS — F17200 Nicotine dependence, unspecified, uncomplicated: Secondary | ICD-10-CM | POA: Diagnosis present

## 2015-11-28 DIAGNOSIS — Z915 Personal history of self-harm: Secondary | ICD-10-CM

## 2015-11-28 DIAGNOSIS — F319 Bipolar disorder, unspecified: Secondary | ICD-10-CM | POA: Diagnosis present

## 2015-11-28 DIAGNOSIS — F209 Schizophrenia, unspecified: Secondary | ICD-10-CM | POA: Diagnosis present

## 2015-11-28 DIAGNOSIS — E7800 Pure hypercholesterolemia, unspecified: Secondary | ICD-10-CM | POA: Diagnosis present

## 2015-11-28 DIAGNOSIS — Z853 Personal history of malignant neoplasm of breast: Secondary | ICD-10-CM

## 2015-11-28 DIAGNOSIS — Z818 Family history of other mental and behavioral disorders: Secondary | ICD-10-CM

## 2015-11-28 DIAGNOSIS — F1020 Alcohol dependence, uncomplicated: Secondary | ICD-10-CM | POA: Diagnosis present

## 2015-11-28 DIAGNOSIS — R7989 Other specified abnormal findings of blood chemistry: Secondary | ICD-10-CM | POA: Diagnosis present

## 2015-11-28 DIAGNOSIS — R945 Abnormal results of liver function studies: Secondary | ICD-10-CM

## 2015-11-28 DIAGNOSIS — G4700 Insomnia, unspecified: Secondary | ICD-10-CM | POA: Diagnosis present

## 2015-11-28 DIAGNOSIS — F1094 Alcohol use, unspecified with alcohol-induced mood disorder: Secondary | ICD-10-CM | POA: Diagnosis present

## 2015-11-28 DIAGNOSIS — F332 Major depressive disorder, recurrent severe without psychotic features: Secondary | ICD-10-CM | POA: Diagnosis not present

## 2015-11-28 DIAGNOSIS — F1994 Other psychoactive substance use, unspecified with psychoactive substance-induced mood disorder: Secondary | ICD-10-CM | POA: Diagnosis present

## 2015-11-28 DIAGNOSIS — Z8249 Family history of ischemic heart disease and other diseases of the circulatory system: Secondary | ICD-10-CM

## 2015-11-28 DIAGNOSIS — F411 Generalized anxiety disorder: Secondary | ICD-10-CM | POA: Diagnosis present

## 2015-11-28 DIAGNOSIS — F10920 Alcohol use, unspecified with intoxication, uncomplicated: Secondary | ICD-10-CM

## 2015-11-28 DIAGNOSIS — F4310 Post-traumatic stress disorder, unspecified: Secondary | ICD-10-CM | POA: Diagnosis present

## 2015-11-28 LAB — COMPREHENSIVE METABOLIC PANEL
ALBUMIN: 4.1 g/dL (ref 3.5–5.0)
ALK PHOS: 41 U/L (ref 38–126)
ALT: 26 U/L (ref 17–63)
AST: 39 U/L (ref 15–41)
Anion gap: 14 (ref 5–15)
BILIRUBIN TOTAL: 0.6 mg/dL (ref 0.3–1.2)
CO2: 21 mmol/L — ABNORMAL LOW (ref 22–32)
CREATININE: 0.93 mg/dL (ref 0.61–1.24)
Calcium: 9.5 mg/dL (ref 8.9–10.3)
Chloride: 106 mmol/L (ref 101–111)
GFR calc Af Amer: >60 mL/min (ref >60)
GLUCOSE: 98 mg/dL (ref 65–99)
Potassium: 2.9 mmol/L — ABNORMAL LOW (ref 3.5–5.1)
Sodium: 141 mmol/L (ref 135–145)
TOTAL PROTEIN: 7.2 g/dL (ref 6.5–8.1)

## 2015-11-28 LAB — RAPID URINE DRUG SCREEN, HOSP PERFORMED
Amphetamines: NOT DETECTED
BENZODIAZEPINES: NOT DETECTED
Barbiturates: NOT DETECTED
COCAINE: NOT DETECTED
Opiates: NOT DETECTED
Tetrahydrocannabinol: NOT DETECTED

## 2015-11-28 MED ORDER — LORAZEPAM 1 MG PO TABS: 0.0000 mg | ORAL_TABLET | Freq: Two times a day (BID) | ORAL | Status: DC

## 2015-11-28 MED ORDER — ARIPIPRAZOLE 10 MG PO TABS
10.0000 mg | ORAL_TABLET | Freq: Every day | ORAL | Status: DC
  Administered 2015-11-28: 10 mg via ORAL
  Filled 2015-11-28: qty 1

## 2015-11-28 MED ORDER — LORAZEPAM 2 MG/ML IJ SOLN: 0.0000 mg | Freq: Four times a day (QID) | INTRAMUSCULAR | Status: DC

## 2015-11-28 MED ORDER — HYDROXYZINE HCL 25 MG PO TABS
25.0000 mg | ORAL_TABLET | Freq: Four times a day (QID) | ORAL | Status: DC | PRN
  Administered 2015-11-28: 25 mg via ORAL
  Filled 2015-11-28: qty 1

## 2015-11-28 MED ORDER — METOPROLOL TARTRATE 25 MG PO TABS
25.0000 mg | ORAL_TABLET | Freq: Two times a day (BID) | ORAL | Status: DC
  Administered 2015-11-29 – 2015-12-03 (×7): 25 mg via ORAL
  Filled 2015-11-28 (×15): qty 1

## 2015-11-28 MED ORDER — TRAZODONE HCL 100 MG PO TABS
100.0000 mg | ORAL_TABLET | Freq: Every day | ORAL | Status: DC
  Administered 2015-11-28 – 2015-12-02 (×5): 100 mg via ORAL
  Filled 2015-11-28 (×5): qty 1
  Filled 2015-11-28 (×2): qty 7
  Filled 2015-11-28: qty 1

## 2015-11-28 MED ORDER — AMLODIPINE BESYLATE 5 MG PO TABS: 5.0000 mg | ORAL_TABLET | Freq: Every day | ORAL | Status: DC

## 2015-11-28 MED ORDER — IBUPROFEN 600 MG PO TABS
600.0000 mg | ORAL_TABLET | Freq: Three times a day (TID) | ORAL | Status: DC | PRN
  Administered 2015-12-01: 600 mg via ORAL
  Filled 2015-11-28: qty 1

## 2015-11-28 MED ORDER — TRAZODONE HCL 100 MG PO TABS
100.0000 mg | ORAL_TABLET | Freq: Every day | ORAL | Status: DC
  Administered 2015-11-28: 100 mg via ORAL
  Filled 2015-11-28: qty 1

## 2015-11-28 MED ORDER — ONDANSETRON 4 MG PO TBDP
8.0000 mg | ORAL_TABLET | Freq: Once | ORAL | Status: AC
  Administered 2015-11-28: 8 mg via ORAL
  Filled 2015-11-28: qty 2

## 2015-11-28 MED ORDER — ATORVASTATIN CALCIUM 20 MG PO TABS
20.0000 mg | ORAL_TABLET | ORAL | Status: DC
  Administered 2015-11-29 – 2015-12-03 (×5): 20 mg via ORAL
  Filled 2015-11-28 (×3): qty 1
  Filled 2015-11-28: qty 7
  Filled 2015-11-28: qty 1
  Filled 2015-11-28: qty 7
  Filled 2015-11-28: qty 1
  Filled 2015-11-28: qty 2

## 2015-11-28 MED ORDER — ALUM & MAG HYDROXIDE-SIMETH 200-200-20 MG/5ML PO SUSP: 30.0000 mL | ORAL | Status: DC | PRN

## 2015-11-28 MED ORDER — ATORVASTATIN CALCIUM 10 MG PO TABS
20.0000 mg | ORAL_TABLET | ORAL | Status: DC
  Administered 2015-11-28: 20 mg via ORAL
  Filled 2015-11-28: qty 2

## 2015-11-28 MED ORDER — METOPROLOL TARTRATE 25 MG PO TABS
25.0000 mg | ORAL_TABLET | Freq: Two times a day (BID) | ORAL | Status: DC
  Administered 2015-11-28: 25 mg via ORAL
  Filled 2015-11-28: qty 1

## 2015-11-28 MED ORDER — DULOXETINE HCL 30 MG PO CPEP
30.0000 mg | ORAL_CAPSULE | Freq: Every day | ORAL | Status: DC
  Filled 2015-11-28: qty 1

## 2015-11-28 MED ORDER — LORAZEPAM 2 MG/ML IJ SOLN: 0.0000 mg | Freq: Two times a day (BID) | INTRAMUSCULAR | Status: DC

## 2015-11-28 MED ORDER — NICOTINE 21 MG/24HR TD PT24
21.0000 mg | MEDICATED_PATCH | Freq: Every day | TRANSDERMAL | Status: DC
  Filled 2015-11-28 (×5): qty 1

## 2015-11-28 MED ORDER — ONDANSETRON HCL 4 MG PO TABS
4.0000 mg | ORAL_TABLET | Freq: Three times a day (TID) | ORAL | Status: DC | PRN
Start: 2015-11-28 — End: 2015-11-29

## 2015-11-28 MED ORDER — ARIPIPRAZOLE 10 MG PO TABS
10.0000 mg | ORAL_TABLET | Freq: Every day | ORAL | Status: DC
  Administered 2015-11-28 – 2015-12-03 (×6): 10 mg via ORAL
  Filled 2015-11-28 (×6): qty 1
  Filled 2015-11-28 (×2): qty 7
  Filled 2015-11-28: qty 1

## 2015-11-28 MED ORDER — DULOXETINE HCL 30 MG PO CPEP
30.0000 mg | ORAL_CAPSULE | Freq: Every day | ORAL | Status: DC
  Administered 2015-11-28 – 2015-12-03 (×6): 30 mg via ORAL
  Filled 2015-11-28: qty 7
  Filled 2015-11-28 (×8): qty 1
  Filled 2015-11-28: qty 7

## 2015-11-28 MED ORDER — LORAZEPAM 1 MG PO TABS
0.0000 mg | ORAL_TABLET | Freq: Four times a day (QID) | ORAL | Status: DC
  Administered 2015-11-28: 1 mg via ORAL
  Filled 2015-11-28: qty 1

## 2015-11-28 MED ORDER — POTASSIUM CHLORIDE CRYS ER 20 MEQ PO TBCR
40.0000 meq | EXTENDED_RELEASE_TABLET | Freq: Once | ORAL | Status: AC
  Administered 2015-11-28: 40 meq via ORAL
  Filled 2015-11-28: qty 2

## 2015-11-28 MED ORDER — AMLODIPINE BESYLATE 5 MG PO TABS
5.0000 mg | ORAL_TABLET | Freq: Every day | ORAL | Status: DC
  Administered 2015-11-29 – 2015-12-03 (×5): 5 mg via ORAL
  Filled 2015-11-28 (×9): qty 1

## 2015-11-28 NOTE — ED Notes (Signed)
Pt c/o discomfort in his abdomen.  States he was not able to eat all of his lunch due to it.  States he feels a little nauseous.  Will speak to provider about ordering meds for patient.
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
## 2015-11-28 NOTE — ED Notes (Signed)
PA made aware of pt's request for "night time meds"

## 2015-11-28 NOTE — ED Notes (Signed)
[facility] called & requests to be updated when the pt is  More alert, this Rn will f/u & contact [facility] when pt is more alert

## 2015-11-28 NOTE — BHH Counselor (Signed)
TC to pt's RN [name] to arrange teleassessment. [name] reports pt is still asleep as he was given Trazodone at 0330. RN will contact TTS when pt is able to participate in TA.  Arnold Long, [place] Therapeutic Triage Specialist

## 2015-11-28 NOTE — BHH Group Notes (Signed)
Adult Psychoeducational Group Note  Date:  11/28/2015 Time:  9:20 PM  Group Topic/Focus:  Wrap-Up Group:   The focus of this group is to help patients review their daily goal of treatment and discuss progress on daily workbooks.  Participation Level:  Did Not Attend  Participation Quality:  None  Affect:  None  Cognitive:  None  Insight: None  Engagement in Group:  None  Modes of Intervention:  Discussion  Additional Comments:  Pt did not attend group.  Victorino Sparrow A 11/28/2015, 9:20 PM
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
## 2015-11-28 NOTE — ED Notes (Signed)
Breakfast Tray at bedside

## 2015-11-28 NOTE — ED Notes (Addendum)
Pt is verbally aggressive toward mother; Pt states he is not suicidal but in a few minutes he yells out that he is suicidal; pt states he has not had alcohol in 4 months; Pt appears to be intoxicated with red eyes; Pt is agitated and anxious;All patient belongings taken home by patients mother, patient is aware

## 2015-11-28 NOTE — ED Provider Notes (Signed)
CSN: [id number]     Arrival date & time 11/27/15  2302 History   First MD Initiated Contact with Patient 11/28/15 0046     Chief Complaint  Patient presents with  . Alcohol Intoxication  . Suicidal     (Consider location/radiation/quality/duration/timing/severity/associated sxs/prior Treatment) HPI Comments: Patient presents with mother who reports the patient fell while drinking alcohol and taking Ativan prior to arrival. He was in a standing position and fell backwards onto the floor. No LOC. He does not remember the event. He denies symptoms of injury or pain. Mother reports no vomiting and no change in behavior post-fall. While in the emergency department the patient was witnessed attempting to empty a bottle of Ativan into his mouth which was unsuccessful due to nursing and mother's intervention. The patient currently denies alcohol use, and demands to have his Ativan returned to him.   Patient is a 35 y.o. male presenting with intoxication. The history is provided by the patient and a parent. No language interpreter was used.  Alcohol Intoxication This is a recurrent problem. The current episode started today.    Past Medical History  Diagnosis Date  . Seizures ([place])   . Hypertension   . Depression   . Pancreatitis   . Liver cirrhosis ([place])   . Coronary artery disease   . Angina   . Shortness of breath   . Headache(784.0)   . Peripheral vascular disease [facility]) April 2011    Left Pop  . Hypercholesteremia   . Schizophrenia ([place])   . Bipolar 1 disorder ([place])   . Cancer of kidney ([place]) dx'd 04/2013    lt nephrectomy  . Breast CA ([place]) dx'd 2009    bil w/ bil masectomy and oral meds  . H/O suicide attempt 2015    overdose   Past Surgical History  Procedure Laterality Date  . Chest surgery    . Left leg surgery    . Mastectomy    . Left kidney removal    . Breast surgery     Family History  Problem Relation Age of Onset  . Stroke Other   . Cancer Other   .  Hyperlipidemia Mother   . Hypertension Mother    Social History  Substance Use Topics  . Smoking status: Current Every Day Smoker  . Smokeless tobacco: None  . Alcohol Use: 0.0 oz/week    Review of Systems  Reason unable to perform ROS: Patient significantly intoxicated.      Allergies  Codeine; Penicillins; Depakote; Morphine; Coconut flavor; Depakote er; Oxycodone; and Hydrocodone-acetaminophen  Home Medications   Prior to Admission medications   Medication Sig Start Date End Date Taking? Authorizing Provider  amLODipine (NORVASC) 5 MG tablet Take 1 tablet (5 mg total) by mouth daily. 10/09/15   Benjamine Mola, FNP  ARIPiprazole (ABILIFY) 10 MG tablet Take 1 tablet (10 mg total) by mouth daily. 10/09/15   Benjamine Mola, FNP  atorvastatin (LIPITOR) 20 MG tablet Take 1 tablet (20 mg total) by mouth every morning. 10/09/15   Benjamine Mola, FNP  DULoxetine (CYMBALTA) 30 MG capsule Take 1 capsule (30 mg total) by mouth daily. 10/09/15   Benjamine Mola, FNP  hydrOXYzine (ATARAX/VISTARIL) 25 MG tablet Take 1 tablet (25 mg total) by mouth every 6 (six) hours as needed. 10/09/15   Benjamine Mola, FNP  LORazepam (ATIVAN) 1 MG tablet One tablet PO TID for 2 days, one tablet PO BID for 2 days, one tablet PO daily  for 2 days then STOP 11/26/15   Ripley Fraise, MD  metoprolol tartrate (LOPRESSOR) 25 MG tablet Take 1 tablet (25 mg total) by mouth 2 (two) times daily. 10/09/15   Benjamine Mola, FNP  traZODone (DESYREL) 100 MG tablet Take 1 tablet (100 mg total) by mouth at bedtime. 10/09/15   Elyse Jarvis Withrow, FNP   BP 145/86 mmHg  Pulse 117  Temp(Src) 98.5 F (36.9 C) (Oral)  Resp 20  SpO2 98% Physical Exam  Constitutional: He is oriented to person, place, and time. He appears well-developed and well-nourished.  HENT:  Head: Normocephalic and atraumatic.  Neck: Normal range of motion. Neck supple.  Cardiovascular: Normal rate and regular rhythm.   Pulmonary/Chest: Effort normal and breath  sounds normal.  Abdominal: Soft. Bowel sounds are normal. There is no tenderness. There is no rebound and no guarding.  Musculoskeletal: Normal range of motion.  No midline or paracervical tenderness.   Neurological: He is alert and oriented to person, place, and time.  Skin: Skin is warm and dry. No rash noted.  Psychiatric: His speech is slurred. Cognition and memory are impaired. He expresses impulsivity.    ED Course  Procedures (including critical care time) Labs Review Labs Reviewed  COMPREHENSIVE METABOLIC PANEL - Abnormal; Notable for the following:    Potassium 2.9 (*)    CO2 21 (*)    BUN <5 (*)    All other components within normal limits  ETHANOL - Abnormal; Notable for the following:    Alcohol, Ethyl (B) 325 (*)    All other components within normal limits  CBC  URINE RAPID DRUG SCREEN, HOSP PERFORMED    Imaging Review Dg Chest 2 View  11/26/2015  CLINICAL DATA:  Chest pain with vomiting starting this morning. EXAM: CHEST  2 VIEW COMPARISON:  10/07/2015 FINDINGS: Bilateral axillary clips. Postoperative findings along the anterior chest wall. The lungs appear clear. Cardiac and mediastinal contours normal. No pleural effusion identified. IMPRESSION: 1.  No active cardiopulmonary disease is radiographically apparent. Electronically Signed   By: Van Clines M.D.   On: 11/26/2015 10:52   I have personally reviewed and evaluated these images and lab results as part of my medical decision-making.   EKG Interpretation None      MDM   Final diagnoses:  None    1. Alcohol intoxication 2. Attempted overdose  The patient has a Actuary at bedside. He states he does not want to stay in the emergency department. IVC petition filled out to prevent his leaving. Will observe over time and reassess his mental and physical stability when sober.   TTS consult requested. PA Gloriann Loan made aware of patient's presentation and plan of care.   Charlann Lange,  PA-C 11/28/15 [place], MD 11/28/15 2256

## 2015-11-28 NOTE — ED Notes (Signed)
Reg Breakfast Tray Ordered @ [id number].

## 2015-11-28 NOTE — ED Notes (Signed)
A regular diet ordered for patient.

## 2015-11-28 NOTE — Tx Team (Signed)
Initial Interdisciplinary Treatment Plan   PATIENT STRESSORS: Financial difficulties Legal issue Medication change or noncompliance Occupational concerns Substance abuse   PATIENT STRENGTHS: Ability for insight Average or above average intelligence Communication skills Supportive family/friends   PROBLEM LIST: Problem List/Patient Goals Date to be addressed Date deferred Reason deferred Estimated date of resolution  At risk for suicide 11/28/2015  11/28/2015   D/C  Depression 11/28/2015  11/28/2015   D/C  Substance abuse 11/28/2015  11/28/2015   D/C  "Coping Skills" 11/28/2015  11/28/2015   D/C  "Try and get my medication readjusted" 11/28/2015  11/28/2015   D/C                           DISCHARGE CRITERIA:  Ability to meet basic life and health needs Adequate post-discharge living arrangements Improved stabilization in mood, thinking, and/or behavior Motivation to continue treatment in a less acute level of care Need for constant or close observation no longer present Reduction of life-threatening or endangering symptoms to within safe limits Verbal commitment to aftercare and medication compliance Withdrawal symptoms are absent or subacute and managed without 24-hour nursing intervention  PRELIMINARY DISCHARGE PLAN: Outpatient therapy Placement in alternative living arrangements  PATIENT/FAMIILY INVOLVEMENT: This treatment plan has been presented to and reviewed with the patient, Wavely Deichert.  The patient and family have been given the opportunity to ask questions and make suggestions.  [name] 11/28/2015, 6:37 PM

## 2015-11-28 NOTE — Progress Notes (Signed)
Patient accepted to [facility] bed 508-1 per Dr. Shea Evans. Call report to [phone].  Patient can arrive for admission at 4:00pm as voluntary admission.  This Probation officer spoke with Hassan Rowan, RN to update on disposition.     Chesley Noon, MSW, Darlyn Read [facility] Triage Specialist [phone]

## 2015-11-28 NOTE — ED Notes (Signed)
Pt. wanded by security at triage . Purple scrubs given to pt.

## 2015-11-28 NOTE — ED Provider Notes (Signed)
7:30 AM: Care assumed from Charlann Lange, PA-C at shift change.  Briefly, patient presents with alcohol intoxication and taking ativan PTA.  In ED, patient attempted to empty a bottle of Ativan into his mouth which was intervened by nursing and patient's mother.  Plan to observe over time and reassess when sober.  EtOH 325.  K 2.9.  Patient given 40 meq PO potassium.  TTS has been consulted for appropriate disposition.    Gloriann Loan, PA-C 11/28/15 1206  Merryl Hacker, MD 11/28/15 2255
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
## 2015-11-28 NOTE — ED Notes (Signed)
Pt. clothes, shoes and personal belongings bagged and given to mother to take home .

## 2015-11-29 DIAGNOSIS — F332 Major depressive disorder, recurrent severe without psychotic features: Secondary | ICD-10-CM

## 2015-11-29 DIAGNOSIS — F0630 Mood disorder due to known physiological condition, unspecified: Secondary | ICD-10-CM

## 2015-11-29 DIAGNOSIS — R45851 Suicidal ideations: Secondary | ICD-10-CM

## 2015-11-29 DIAGNOSIS — F1020 Alcohol dependence, uncomplicated: Principal | ICD-10-CM

## 2015-11-29 LAB — COMPREHENSIVE METABOLIC PANEL
ALBUMIN: 3.9 g/dL (ref 3.5–5.0)
ALK PHOS: 38 U/L (ref 38–126)
ALT: 24 U/L (ref 17–63)
AST: 36 U/L (ref 15–41)
Anion gap: 10 (ref 5–15)
BILIRUBIN TOTAL: 0.8 mg/dL (ref 0.3–1.2)
BUN: 9 mg/dL (ref 6–20)
CO2: 25 mmol/L (ref 22–32)
CREATININE: 0.91 mg/dL (ref 0.61–1.24)
Calcium: 9.3 mg/dL (ref 8.9–10.3)
Chloride: 101 mmol/L (ref 101–111)
GFR calc Af Amer: >60 mL/min (ref >60)
GLUCOSE: 107 mg/dL — AB (ref 65–99)
POTASSIUM: 3.6 mmol/L (ref 3.5–5.1)
Sodium: 136 mmol/L (ref 135–145)
TOTAL PROTEIN: 7.1 g/dL (ref 6.5–8.1)

## 2015-11-29 LAB — ETHANOL: Alcohol, Ethyl (B): <5 mg/dL (ref <5)

## 2015-11-29 MED ORDER — HYDROXYZINE HCL 25 MG PO TABS
25.0000 mg | ORAL_TABLET | Freq: Four times a day (QID) | ORAL | Status: AC | PRN
  Administered 2015-11-30 – 2015-12-02 (×2): 25 mg via ORAL
  Filled 2015-11-29 (×2): qty 1

## 2015-11-29 MED ORDER — ONDANSETRON 4 MG PO TBDP: 4.0000 mg | ORAL_TABLET | Freq: Four times a day (QID) | ORAL | Status: AC | PRN

## 2015-11-29 MED ORDER — ADULT MULTIVITAMIN W/MINERALS CH
1.0000 | ORAL_TABLET | Freq: Every day | ORAL | Status: DC
  Administered 2015-11-30 – 2015-12-03 (×4): 1 via ORAL
  Filled 2015-11-29 (×7): qty 1

## 2015-11-29 MED ORDER — LOPERAMIDE HCL 2 MG PO CAPS: 2.0000 mg | ORAL_CAPSULE | ORAL | Status: AC | PRN

## 2015-11-29 MED ORDER — LORAZEPAM 1 MG PO TABS
1.0000 mg | ORAL_TABLET | Freq: Four times a day (QID) | ORAL | Status: AC
  Administered 2015-11-29 – 2015-11-30 (×5): 1 mg via ORAL
  Filled 2015-11-29 (×4): qty 1

## 2015-11-29 MED ORDER — THIAMINE HCL 100 MG/ML IJ SOLN: 100.0000 mg | Freq: Once | INTRAMUSCULAR | Status: DC

## 2015-11-29 MED ORDER — LORAZEPAM 1 MG PO TABS
1.0000 mg | ORAL_TABLET | Freq: Every day | ORAL | Status: AC
  Administered 2015-12-03: 1 mg via ORAL
  Filled 2015-11-29: qty 1

## 2015-11-29 MED ORDER — LORAZEPAM 1 MG PO TABS
1.0000 mg | ORAL_TABLET | Freq: Two times a day (BID) | ORAL | Status: AC
  Administered 2015-12-02 (×2): 1 mg via ORAL
  Filled 2015-11-29 (×2): qty 1

## 2015-11-29 MED ORDER — LORAZEPAM 1 MG PO TABS: 1.0000 mg | ORAL_TABLET | Freq: Four times a day (QID) | ORAL | Status: AC | PRN

## 2015-11-29 MED ORDER — LORAZEPAM 1 MG PO TABS
1.0000 mg | ORAL_TABLET | Freq: Three times a day (TID) | ORAL | Status: AC
  Administered 2015-12-01 (×3): 1 mg via ORAL
  Filled 2015-11-29 (×4): qty 1

## 2015-11-29 MED ORDER — VITAMIN B-1 100 MG PO TABS
100.0000 mg | ORAL_TABLET | Freq: Every day | ORAL | Status: DC
  Administered 2015-11-30 – 2015-12-03 (×4): 100 mg via ORAL
  Filled 2015-11-29 (×7): qty 1

## 2015-11-29 NOTE — Progress Notes (Signed)
D: Pt is flat, isolative and withdrawn to room; in bed with eyes closed all evening. Pt endorses moderate depression and anxiety; states, "both my depression and anxiety are at a 7." Pt also endorses SI however, contracts for safety; states, "I felt like doing something earlier but I feel better now." Denies HI, pain or AVH. A: Medications offered as prescribed.  Support, encouragement, and safe environment provided.  15-minute safety checks continue. R: Pt was med compliant.  Pt attended wrap-up group. Safety checks continue.
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
## 2015-11-29 NOTE — Progress Notes (Signed)
Patient ID: [name], male   DOB: [birth year], 35 y.o.   MRN: [id number]  D: Pt has in the bed most of the day, pt reported that he was not feeling well. Pt reported that he could not lift his head off the bed, and that he just wanted to sleep. Pt refused his morning medication due to not feeling well, however he did take his afternoon medication. Pt reported that he was feeling much better. Pt reported being negative SI/HI, no AH/VH noted. A: 15 min checks continued for patient safety. R: Pt safety maintained.

## 2015-11-29 NOTE — BHH Group Notes (Signed)
[facility] Group Notes: (Clinical Social Work)   11/29/2015      Type of Therapy:  Group Therapy   Participation Level:  Did Not Attend despite MHT prompting   Selmer Dominion, LCSW 11/29/2015, 12:28 PM

## 2015-11-29 NOTE — BHH Group Notes (Signed)
[facility] Group Notes:  (Nursing/MHT/Case Management/Adjunct)  Date:  11/29/2015  Time:  3:19 PM  Type of Therapy:  Psychoeducational Skills  Participation Level:  Did Not Attend  Participation Quality:  Did Not Attend  Affect:  Did Not Attend  Cognitive:  Did Not Attend  Insight:  None  Engagement in Group:  Did Not Attend  Modes of Intervention:  Did Not Attend  Summary of Progress/Problems: Pt did not attend patient self inventory group.   Benancio Deeds Shanta 11/29/2015, 3:19 PM

## 2015-11-30 NOTE — Progress Notes (Signed)
D: Pt is flat, isolative and withdrawn to room; remained in bed all evening. Pt endorses moderate depression and anxiety; states, "I am still very depressed and very anxious." Pt however denies SI, HI, pain or AVH.  A: Medications offered as prescribed.  Support, encouragement, and safe environment provided.  15-minute safety checks continue. R: Pt was med compliant.  Pt did not attend wrap-up group. Safety checks continue.

## 2015-11-30 NOTE — Progress Notes (Signed)
[name] MD Progress Note  11/30/2015 12:25 PM [name]  MRN:  [id number] Subjective:  [name] states that he was doing well and feels better than he did yesterday. " I was at [facility] for 4 days so I should feel better. I was able to get up this morning and went to breakfast. I completed my daily intake sheet today. I plan on looking up and calling some long-term placement options."   Patient was seen and chart reviewed. Patient stated that he is a 35 years old single AA male admitted voluntarily from [facility] for worsening depression, substance abuse and status post suicide attempt. Patient reported she has been depressed for years which was recently exacerbated because of the death of his friend who passed away, job loss, and housing conditions.   During today's assessment, pt reported great sleep and poor appetite, that is improving. Currently rates depression at 7/10 but anxiety at 5/10. Pt states that his medications are helping him and he was able to ambulate the unit, and feels like he will be able to go home tomorrow.  Pt denies SI, HI, and AVH, but states that he has improved in the last 24 hours. Does contract for safety.   Principal Problem: Alcohol use disorder, severe, dependence ([place]) Diagnosis:   Patient Active Problem List   Diagnosis Date Noted  . Mood disorder in conditions classified elsewhere [F06.30]   . MDD (major depressive disorder), recurrent severe, without psychosis ([place]) [F33.2] 10/08/2015  . Malnutrition of moderate degree [E44.0] 09/24/2015  . Alcohol withdrawal ([place]) [F10.239] 09/23/2015  . Tobacco use disorder [F17.200] 07/16/2015  . Elevated transaminase level [R74.0]   . Drug overdose, intentional ([place]) [T50.902A] 07/12/2015  . Alcohol use disorder, severe, dependence ([place]) [F10.20] 04/15/2015  . Cocaine abuse with cocaine-induced mood disorder ([place]) [F14.14] 04/11/2015  . Overdose [T50.901A] 04/10/2015  . Severe recurrent major depressive disorder with psychotic  features ([place]) [F33.3]   . Severe major depression with psychotic features ([place]) [F32.3] 09/11/2014  . Alcohol-induced mood disorder ([place]) [F10.94] 09/10/2014  . Suicidal ideation [R45.851]   . Tylenol overdose [T39.1X4A]   . Polysubstance abuse [F19.10]   . Overdose of acetaminophen [T39.1X4A] 08/03/2014  . Overdose by acetaminophen [T39.1X4A] 08/03/2014  . Cocaine abuse [F14.10] 04/16/2014  . Thrombocytopenia ([place]) [D69.6] 04/15/2014  . Urinary tract infection, site not specified [N39.0] 04/15/2014  . Chest pain, unspecified [R07.9] 04/15/2014  . D-dimer, elevated [R79.1] 04/15/2014  . Transaminitis [R74.0] 09/24/2013  . Cocaine dependence ([place]) [F14.20] 09/20/2013  . 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 Seizure ([place]) [R56.9] 03/15/2013  . Syncope [R55] 01/02/2013  . Leukocytopenia, unspecified [D72.819] 01/02/2013  . Left kidney mass [N28.89] 12/24/2012  . PTSD (post-traumatic stress disorder) [F43.10] 07/06/2012  . Major depressive disorder, recurrent episode ([place]) [F33.9] 06/29/2012    Class: Acute  . Peripheral vascular disease ([place]) [I73.9] 01/14/2012  . SEIZURE DISORDER [R56.9] 10/03/2008  . LIVER FUNCTION TESTS, ABNORMAL [R94.5] 12/29/2007  . HYPERCHOLESTEROLEMIA [E78.00] 03/21/2007  . Essential hypertension [I10] 03/21/2007   Total Time spent with patient: 30 minutes   Past Medical History:  Past Medical History  Diagnosis Date  . Seizures ([place])   . Hypertension   . Depression   . Pancreatitis   . Liver cirrhosis ([place])   . Coronary artery disease   . Angina   . Shortness of breath   . Headache(784.0)   . Peripheral vascular disease [facility]) April 2011    Left Pop  . Hypercholesteremia   . Schizophrenia ([place])   .  Bipolar 1 disorder ([place])   . Cancer of kidney ([place]) dx'd 04/2013    lt nephrectomy  . Breast CA ([place]) dx'd 2009    bil w/ bil masectomy and oral meds  . H/O suicide attempt 2015    overdose    Past Surgical History  Procedure Laterality Date   . Chest surgery    . Left leg surgery    . Mastectomy    . Left kidney removal    . Breast surgery     Family History:  Family History  Problem Relation Age of Onset  . Stroke Other   . Cancer Other   . Hyperlipidemia Mother   . Hypertension Mother    Social History:  History  Alcohol Use  . 0.0 oz/week     History  Drug Use No    Comment: former    Social History   Social History  . Marital Status: Single    Spouse Name: N/A  . Number of Children: N/A  . Years of Education: N/A   Social History Main Topics  . Smoking status: Current Every Day Smoker  . Smokeless tobacco: None  . Alcohol Use: 0.0 oz/week  . Drug Use: No     Comment: former  . Sexual Activity: Yes    Birth Control/ Protection: Condom     Comment: anal   Other Topics Concern  . None   Social History Narrative   ** Merged History Encounter **       Additional History:    Sleep: Poor  Appetite:  Fair   Assessment:   Musculoskeletal: Strength & Muscle Tone: within normal limits Gait & Station: normal Patient leans: N/A   Psychiatric Specialty Exam: Physical Exam  Review of Systems  Constitutional: Negative.   HENT: Negative.   Eyes: Negative.   Respiratory: Negative.   Cardiovascular: Negative.   Gastrointestinal: Negative.   Genitourinary: Negative.   Musculoskeletal: Negative.   Skin: Negative.   Neurological: Negative.   Endo/Heme/Allergies: Negative.   Psychiatric/Behavioral: Positive for depression. The patient is nervous/anxious and has insomnia.     Blood pressure 132/79, pulse 95, temperature 97.8 F (36.6 C), temperature source Oral, resp. rate 16, height _0  (1.676 m), weight 67.586 kg (149 lb), SpO2 100 %.Body mass index is 24.06 kg/(m^2).  General Appearance: Fairly Groomed  Engineer, water::  Fair  Speech:  Clear and Coherent  Volume:  Normal  Mood:  Depressed  Affect:  anxious worried  Thought Process:  Coherent and Goal Directed  Orientation:  Full  (Time, Place, and Person)  Thought Content:  symptoms events worries concerns  Suicidal Thoughts:  No  Homicidal Thoughts:  No  Memory:  Immediate;   Fair Recent;   Fair Remote;   Fair  Judgement:  Fair  Insight:  Present and Shallow  Psychomotor Activity:  Normal  Concentration:  Fair  Recall:  [employer] of Knowledge:Fair  Language: Fair  Akathisia:  No  Handed:  Right  AIMS (if indicated):     Assets:  Desire for Improvement Housing Vocational/Educational  ADL's:  Intact  Cognition: WNL  Sleep:  Number of Hours: 6.75     Current Medications: Current Facility-Administered Medications  Medication Dose Route Frequency Provider Last Rate Last Dose  . alum & mag hydroxide-simeth (MAALOX/MYLANTA) 200-200-20 MG/5ML suspension 30 mL  30 mL Oral PRN Delfin Gant, NP      . amLODipine (NORVASC) tablet 5 mg  5 mg Oral Daily Delfin Gant,  NP   5 mg at 11/30/15 1058  . ARIPiprazole (ABILIFY) tablet 10 mg  10 mg Oral Daily Delfin Gant, NP   10 mg at 11/30/15 1058  . atorvastatin (LIPITOR) tablet 20 mg  20 mg Oral BH-q7a Delfin Gant, NP   20 mg at 11/30/15 0488  . DULoxetine (CYMBALTA) DR capsule 30 mg  30 mg Oral Daily Delfin Gant, NP   30 mg at 11/30/15 1058  . hydrOXYzine (ATARAX/VISTARIL) tablet 25 mg  25 mg Oral Q6H PRN Nanci Pina, FNP      . ibuprofen (ADVIL,MOTRIN) tablet 600 mg  600 mg Oral Q8H PRN Delfin Gant, NP      . loperamide (IMODIUM) capsule 2-4 mg  2-4 mg Oral PRN Nanci Pina, FNP      . LORazepam (ATIVAN) tablet 1 mg  1 mg Oral Q6H PRN Nanci Pina, FNP      . LORazepam (ATIVAN) tablet 1 mg  1 mg Oral QID Nanci Pina, FNP   1 mg at 11/30/15 1210   Followed by  . [START ON 12/01/2015] LORazepam (ATIVAN) tablet 1 mg  1 mg Oral TID Nanci Pina, FNP       Followed by  . [START ON 12/02/2015] LORazepam (ATIVAN) tablet 1 mg  1 mg Oral BID Nanci Pina, FNP       Followed by  . [START ON 12/03/2015] LORazepam  (ATIVAN) tablet 1 mg  1 mg Oral Daily Nanci Pina, FNP      . metoprolol tartrate (LOPRESSOR) tablet 25 mg  25 mg Oral BID Delfin Gant, NP   25 mg at 11/30/15 1058  . multivitamin with minerals tablet 1 tablet  1 tablet Oral Daily Nanci Pina, FNP   1 tablet at 11/30/15 1058  . nicotine (NICODERM CQ - dosed in mg/24 hours) patch 21 mg  21 mg Transdermal Daily Delfin Gant, NP   21 mg at 11/28/15 2151  . ondansetron (ZOFRAN-ODT) disintegrating tablet 4 mg  4 mg Oral Q6H PRN Nanci Pina, FNP      . thiamine (B-1) injection 100 mg  100 mg Intramuscular Once Nanci Pina, FNP   100 mg at 11/29/15 1430  . thiamine (VITAMIN B-1) tablet 100 mg  100 mg Oral Daily Nanci Pina, FNP   100 mg at 11/30/15 1058  . traZODone (DESYREL) tablet 100 mg  100 mg Oral QHS Delfin Gant, NP   100 mg at 11/29/15 2119    Lab Results:  Results for orders placed or performed during the hospital encounter of 11/28/15 (from the past 48 hour(s))  Ethanol     Status: None   Collection Time: 11/29/15  6:38 PM  Result Value Ref Range   Alcohol, Ethyl (B) <5 <5 mg/dL    Comment:        LOWEST DETECTABLE LIMIT FOR SERUM ALCOHOL IS 5 mg/dL FOR MEDICAL PURPOSES ONLY Performed at [facility]   Comprehensive metabolic panel     Status: Abnormal   Collection Time: 11/29/15  6:38 PM  Result Value Ref Range   Sodium 136 135 - 145 mmol/L   Potassium 3.6 3.5 - 5.1 mmol/L   Chloride 101 101 - 111 mmol/L   CO2 25 22 - 32 mmol/L   Glucose, Bld 107 (H) 65 - 99 mg/dL   BUN 9 6 - 20 mg/dL   Creatinine, Ser 0.91 0.61 - 1.24 mg/dL  Calcium 9.3 8.9 - 10.3 mg/dL   Total Protein 7.1 6.5 - 8.1 g/dL   Albumin 3.9 3.5 - 5.0 g/dL   AST 36 15 - 41 U/L   ALT 24 17 - 63 U/L   Alkaline Phosphatase 38 38 - 126 U/L   Total Bilirubin 0.8 0.3 - 1.2 mg/dL   GFR calc non Af Amer >60 >60 mL/min   GFR calc Af Amer >60 >60 mL/min    Comment: (NOTE) The eGFR has been calculated using the  CKD EPI equation. This calculation has not been validated in all clinical situations. eGFR's persistently <60 mL/min signify possible Chronic Kidney Disease.    Anion gap 10 5 - 15    Comment: Performed at [facility]    Physical Findings: AIMS: Facial and Oral Movements Muscles of Facial Expression: None, normal Lips and Perioral Area: None, normal Jaw: None, normal Tongue: None, normal,Extremity Movements Upper (arms, wrists, hands, fingers): None, normal Lower (legs, knees, ankles, toes): None, normal, Trunk Movements Neck, shoulders, hips: None, normal, Overall Severity Severity of abnormal movements (highest score from questions above): None, normal Incapacitation due to abnormal movements: None, normal [name] awareness of abnormal movements (rate only patient's report): No Awareness, Dental Status Current problems with teeth and/or dentures?: No Does patient usually wear dentures?: No  CIWA:  CIWA-Ar Total: 5 COWS:     Treatment Plan Summary: Daily contact with patient to assess and evaluate symptoms and progress in treatment and Medication management Daily contact with patient to assess and evaluate symptoms and progress in treatment and Medication management Will start CIWA and Ativan Detox protocol.  Will continue Abilify 10 mg po bid for psychosis/mood sx.. Will continue Cymbalta 3010 mg po daily for depression. Will make available PRN medications as per agitation protocol. Will continue to monitor vitals, medication compliance and treatment side effects while patient is here.  Will monitor for medical issues as well as call consult as needed.  Reviewed labs CBC, CMP potassium has increased and is now 3.6, UDS - Negative even though patient states he took Ativan prior to admission, Repeat BAL was negative (325 on admission).  CSW will continue working on disposition.  Patient to participate in therapeutic milieu .   Medical Decision  Making:  Review of Psycho-Social Stressors (1), Review of Medication Regimen & Side Effects (2) and Review of New Medication or Change in Dosage (2)   [place] Curry Starkes 11/30/2015, 12:25 PM I agreed with findings and treatment plan of this patient

## 2015-11-30 NOTE — Progress Notes (Signed)
Patient ID: [name], male   DOB: Mar 01, 1981, 35 y.o.   MRN: [id number]    D: Pt has been very flat and depressed most of the day. Pt reported that he was feeling much better today. Pt took all medications without any problems. He reported that he was still having withdrawals, but that they were not as bad as yesterday. Pt reported that his depression was a 5, his hopelessness was a 5, and his anxiety was a 6. Pt reported that his goal for today was to go to rehab long term. Pt reported being negative SI/HI, no AH/VH noted. A: 15 min checks continued for patient safety. R: Pt safety maintained.

## 2015-11-30 NOTE — BHH Group Notes (Signed)
[facility] Group Notes: (Clinical Social Work)  11/30/2015 11:00AM-12:00PM  Summary of Progress/Problems: The main focus of today's process group was to listen to a variety of genres of music and to identify that different types of music provoke different responses. The patient expressed at the beginning of group that he enjoys listening to music and does so frequently.  He sat and listened without talking through the entire group, singing along a few times, but mostly sitting expressionless.  Type of Therapy: Music Therapy   Participation Level: Active  Participation Quality: Attentive a  Affect: Flat and Depressed  Cognitive: Not assessed  Insight: Improving  Engagement in Therapy: Improving  Modes of Intervention: Activity, Exploration  [place] Dominion, LCSW 11/30/2015

## 2015-12-01 NOTE — Plan of Care (Signed)
Problem: Ineffective individual coping Goal: STG: Patient will remain free from self harm Outcome: Progressing Patient has not engaged in self harm. Denies SI.  Problem: Diagnosis: Increased Risk For Suicide Attempt Goal: STG-Patient Will Comply With Medication Regime Outcome: Progressing Patient is med compliant.

## 2015-12-01 NOTE — Tx Team (Signed)
Interdisciplinary Treatment Plan Update (Adult)  Date:  12/01/2015   Time Reviewed:  8:40 AM   Progress in Treatment: Attending groups: Yes. Participating in groups:  Yes. Taking medication as prescribed:  Yes. Tolerating medication:  Yes. Family/Significant other contact made:  No Patient understands diagnosis:  Yes  As evidenced by seeking help with depression, anxiety and SI Discussing patient identified problems/goals with staff:  Yes, see initial care plan. Medical problems stabilized or resolved:  Yes. Denies suicidal/homicidal ideation: Yes. Issues/concerns per patient self-inventory:  No. Other:  New problem(s) identified:  Discharge Plan or Barriers: Pt already asking for d/c as he has a meeting with PO tomorrow  Reason for Continuation of Hospitalization: Anxiety Depression Hallucinations Medication stabilization Withdrawal symptoms  Comments:  35 year old male who presents, in no acute distress, for the treatment of SI, Depression, and Substance Abuse. Patient appears flat, sad, and depressed. Patient was calm and cooperative with admission process. Patient reports that he tried to kill himself "off of Ativan and alcohol". Patient is unable to recall how many Ativan he took. Patient states that he does not remember what happened after taking the Ativan; "It's all just black to me". Patient reports "I have no motivation to live". Patient currently presents with passive SI and contracts for safety upon admission stating "I'm going to keep myself safe while I'm here". Will start CIWA and Ativan Detox protocol.  Will continue Abilify 10 mg po bid for psychosis/mood sx.. Will continue Cymbalta 3010 mg po daily for depression.  Estimated length of stay: 1-2 days  New goal(s):  Review of initial/current patient goals per problem list:   Review of initial/current patient goals per problem list:  1. Goal(s): Patient will participate in aftercare plan   Met: Yes    Target date: 3-5 days post admission date   As evidenced by: Patient will participate within aftercare plan AEB aftercare provider and housing plan at discharge being identified. 12/01/15:  Return home, follow up outpt   2. Goal (s): Patient will exhibit decreased depressive symptoms and suicidal ideations.   Met: No   Target date: 3-5 days post admission date   As evidenced by: Patient will utilize self rating of depression at 3 or below and demonstrate decreased signs of depression or be deemed stable for discharge by MD. 12/01/15:  Rates his depression a 5 today.     3. Goal(s): Patient will demonstrate decreased signs and symptoms of anxiety.   Met: No   Target date: 3-5 days post admission date   As evidenced by: Patient will utilize self rating of anxiety at 3 or below and demonstrated decreased signs of anxiety, or be deemed stable for discharge by MD 12/01/15:  Rates his anxiety a 6 today.     4. Goal(s): Patient will demonstrate decreased signs of withdrawal due to substance abuse   Met: No   Target date: 3-5 days post admission date   As evidenced by: Patient will produce a CIWA/COWS score of 0, have stable vitals signs, and no symptoms of withdrawal 12/01/15:  CIWA score of 3 today.  On Ativan detox protocol.  Getting ativan tid today.     5. Goal(s): Patient will demonstrate decreased signs of psychosis  * Met: Yes  * Target date: 3-5 days post admission date  * As evidenced by: Patient will demonstrate decreased frequency of AVH or return to baseline function 12/01/15:  Denies AH, VH today          Attendees:  Patient:  12/01/2015 8:40 AM   Family:   12/01/2015 8:40 AM   Physician:  Ursula Alert, MD 12/01/2015 8:40 AM   Nursing:   Eulogio Bear, RN 12/01/2015 8:40 AM   CSW:    Roque Lias, LCSW   12/01/2015 8:40 AM   Other:  12/01/2015 8:40 AM   Other:   12/01/2015 8:40 AM   Other:  Lars Pinks, Nurse CM 12/01/2015 8:40 AM   Other:    12/01/2015 8:40 AM   Other:  Norberto Sorenson, Edson Deridder [place]  12/01/2015 8:40 AM   Other:  12/01/2015 8:40 AM   Other:  12/01/2015 8:40 AM   Other:  12/01/2015 8:40 AM   Other:  12/01/2015 8:40 AM   Other:  12/01/2015 8:40 AM   Other:   12/01/2015 8:40 AM    Scribe for Treatment Team:   [name], 12/01/2015 8:40 AM

## 2015-12-01 NOTE — Progress Notes (Signed)
Patient ID: [name], male   DOB: [birth year], 35 y.o.   MRN: [id number] [facility] MD Progress Note  12/01/2015 3:04 PM [name]  MRN:  [id number]  Subjective:  [name] states, "I had a nervous breakdown. I lost my job last month, lost my best friend to aids 2 months ago. Then, I ran out of my medicines because I did not have money to fill my prescriptions. My mama could not help me. I got more depressed, got tired of leaving. I checked myself in before I could do something wrong. I would like to be discharged in the morning. I have to meet with my probation officer tomorrow. I'm feeling a lot better".   Patient was seen and chart reviewed. Patient is a 35 years old single AA male admitted voluntarily from [facility] for worsening depression, substance abuse and status post suicide attempt. Patient reported he has been depressed for years which was recently exacerbated because of the death of his friend who passed away, job loss, and housing conditions.   During today's assessment, pt reported great sleep & good appetite. Currently rates depression at #2 but anxiety at #2. Pt states that his medications are helping him and he was able to ambulate the unit, and feels like he will be able to go home tomorrow.  Pt denies SI, HI, and AVH, but states that he has improved in the last 24 hours. Does contract for safety.   Principal Problem: Alcohol use disorder, severe, dependence ([place]) Diagnosis:   Patient Active Problem List   Diagnosis Date Noted  . Major depressive disorder, recurrent episode ([place]) [F33.9] 06/29/2012    Priority: High    Class: Acute  . Mood disorder in conditions classified elsewhere [F06.30]   . MDD (major depressive disorder), recurrent severe, without psychosis ([place]) [F33.2] 10/08/2015  . Malnutrition of moderate degree [E44.0] 09/24/2015  . Alcohol withdrawal ([place]) [F10.239] 09/23/2015  . Tobacco use disorder [F17.200] 07/16/2015  . Elevated transaminase level [R74.0]   . Drug  overdose, intentional ([place]) [T50.902A] 07/12/2015  . Alcohol use disorder, severe, dependence ([place]) [F10.20] 04/15/2015  . Cocaine abuse with cocaine-induced mood disorder ([place]) [F14.14] 04/11/2015  . Overdose [T50.901A] 04/10/2015  . Severe recurrent major depressive disorder with psychotic features ([place]) [F33.3]   . Severe major depression with psychotic features ([place]) [F32.3] 09/11/2014  . Alcohol-induced mood disorder ([place]) [F10.94] 09/10/2014  . Suicidal ideation [R45.851]   . Tylenol overdose [T39.1X4A]   . Polysubstance abuse [F19.10]   . Overdose of acetaminophen [T39.1X4A] 08/03/2014  . Overdose by acetaminophen [T39.1X4A] 08/03/2014  . Cocaine abuse [F14.10] 04/16/2014  . Thrombocytopenia ([place]) [D69.6] 04/15/2014  . Urinary tract infection, site not specified [N39.0] 04/15/2014  . Chest pain, unspecified [R07.9] 04/15/2014  . D-dimer, elevated [R79.1] 04/15/2014  . Transaminitis [R74.0] 09/24/2013  . Cocaine dependence ([place]) [F14.20] 09/20/2013  . 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 Seizure ([place]) [R56.9] 03/15/2013  . Syncope [R55] 01/02/2013  . Leukocytopenia, unspecified [D72.819] 01/02/2013  . Left kidney mass [N28.89] 12/24/2012  . PTSD (post-traumatic stress disorder) [F43.10] 07/06/2012  . Peripheral vascular disease ([place]) [I73.9] 01/14/2012  . SEIZURE DISORDER [R56.9] 10/03/2008  . LIVER FUNCTION TESTS, ABNORMAL [R94.5] 12/29/2007  . HYPERCHOLESTEROLEMIA [E78.00] 03/21/2007  . Essential hypertension [I10] 03/21/2007   Total Time spent with patient: 15 minutes  Past Medical History:  Past Medical History  Diagnosis Date  . Seizures ([place])   . Hypertension   . Depression   . Pancreatitis   . Liver cirrhosis ([place])   .  Coronary artery disease   . Angina   . Shortness of breath   . Headache(784.0)   . Peripheral vascular disease [facility]) April 2011    Left Pop  . Hypercholesteremia   . Schizophrenia ([place])   . Bipolar 1 disorder ([place])   . Cancer of kidney  ([place]) dx'd 04/2013    lt nephrectomy  . Breast CA ([place]) dx'd 2009    bil w/ bil masectomy and oral meds  . H/O suicide attempt 2015    overdose    Past Surgical History  Procedure Laterality Date  . Chest surgery    . Left leg surgery    . Mastectomy    . Left kidney removal    . Breast surgery     Family History:  Family History  Problem Relation Age of Onset  . Stroke Other   . Cancer Other   . Hyperlipidemia Mother   . Hypertension Mother    Social History:  History  Alcohol Use  . 0.0 oz/week     History  Drug Use No    Comment: former    Social History   Social History  . Marital Status: Single    Spouse Name: N/A  . Number of Children: N/A  . Years of Education: N/A   Social History Main Topics  . Smoking status: Current Every Day Smoker  . Smokeless tobacco: None  . Alcohol Use: 0.0 oz/week  . Drug Use: No     Comment: former  . Sexual Activity: Yes    Birth Control/ Protection: Condom     Comment: anal   Other Topics Concern  . None   Social History Narrative   ** Merged History Encounter **       Additional History:    Sleep: Good  Appetite:  Fair  Assessment:   Musculoskeletal: Strength & Muscle Tone: within normal limits Gait & Station: normal Patient leans: N/A  Psychiatric Specialty Exam: Physical Exam  Review of Systems  Constitutional: Negative.   HENT: Negative.   Eyes: Negative.   Respiratory: Negative.   Cardiovascular: Negative.   Gastrointestinal: Negative.   Genitourinary: Negative.   Musculoskeletal: Negative.   Skin: Negative.   Neurological: Negative.   Endo/Heme/Allergies: Negative.   Psychiatric/Behavioral: Positive for depression. The patient is nervous/anxious and has insomnia.     Blood pressure 117/84, pulse 79, temperature 98.7 F (37.1 C), temperature source Oral, resp. rate 16, height '5\' 6"'  (1.676 m), weight 67.586 kg (149 lb), SpO2 100 %.Body mass index is 24.06 kg/(m^2).  General Appearance:  Fairly Groomed  Engineer, water::  Fair  Speech:  Clear and Coherent  Volume:  Normal  Mood:  Depressed  Affect:  anxious worried  Thought Process:  Coherent and Goal Directed  Orientation:  Full (Time, Place, and Person)  Thought Content:  symptoms events worries concerns  Suicidal Thoughts:  No  Homicidal Thoughts:  No  Memory:  Immediate;   Fair Recent;   Fair Remote;   Fair  Judgement:  Fair  Insight:  Present and Shallow  Psychomotor Activity:  Normal  Concentration:  Fair  Recall:  [employer] of Knowledge:Fair  Language: Fair  Akathisia:  No  Handed:  Right  AIMS (if indicated):     Assets:  Desire for Improvement Housing Vocational/Educational  ADL's:  Intact  Cognition: WNL  Sleep:  Number of Hours: 6.5   Current Medications: Current Facility-Administered Medications  Medication Dose Route Frequency Provider Last Rate Last Dose  .  alum & mag hydroxide-simeth (MAALOX/MYLANTA) 200-200-20 MG/5ML suspension 30 mL  30 mL Oral PRN Delfin Gant, NP      . amLODipine (NORVASC) tablet 5 mg  5 mg Oral Daily Delfin Gant, NP   5 mg at 12/01/15 0803  . ARIPiprazole (ABILIFY) tablet 10 mg  10 mg Oral Daily Delfin Gant, NP   10 mg at 12/01/15 0804  . atorvastatin (LIPITOR) tablet 20 mg  20 mg Oral BH-q7a Delfin Gant, NP   20 mg at 12/01/15 0803  . DULoxetine (CYMBALTA) DR capsule 30 mg  30 mg Oral Daily Delfin Gant, NP   30 mg at 12/01/15 0804  . hydrOXYzine (ATARAX/VISTARIL) tablet 25 mg  25 mg Oral Q6H PRN Nanci Pina, FNP   25 mg at 11/30/15 2119  . ibuprofen (ADVIL,MOTRIN) tablet 600 mg  600 mg Oral Q8H PRN Delfin Gant, NP   600 mg at 12/01/15 1348  . loperamide (IMODIUM) capsule 2-4 mg  2-4 mg Oral PRN Nanci Pina, FNP      . LORazepam (ATIVAN) tablet 1 mg  1 mg Oral Q6H PRN Nanci Pina, FNP      . LORazepam (ATIVAN) tablet 1 mg  1 mg Oral TID Nanci Pina, FNP   1 mg at 12/01/15 1350   Followed by  . [START ON 12/02/2015]  LORazepam (ATIVAN) tablet 1 mg  1 mg Oral BID Nanci Pina, FNP       Followed by  . [START ON 12/03/2015] LORazepam (ATIVAN) tablet 1 mg  1 mg Oral Daily Nanci Pina, FNP      . metoprolol tartrate (LOPRESSOR) tablet 25 mg  25 mg Oral BID Delfin Gant, NP   25 mg at 12/01/15 0804  . multivitamin with minerals tablet 1 tablet  1 tablet Oral Daily Nanci Pina, FNP   1 tablet at 12/01/15 0804  . ondansetron (ZOFRAN-ODT) disintegrating tablet 4 mg  4 mg Oral Q6H PRN Nanci Pina, FNP      . thiamine (B-1) injection 100 mg  100 mg Intramuscular Once Nanci Pina, FNP   100 mg at 11/29/15 1430  . thiamine (VITAMIN B-1) tablet 100 mg  100 mg Oral Daily Nanci Pina, FNP   100 mg at 12/01/15 0804  . traZODone (DESYREL) tablet 100 mg  100 mg Oral QHS Delfin Gant, NP   100 mg at 11/30/15 2119   Lab Results:  Results for orders placed or performed during the hospital encounter of 11/28/15 (from the past 48 hour(s))  Ethanol     Status: None   Collection Time: 11/29/15  6:38 PM  Result Value Ref Range   Alcohol, Ethyl (B) <5 <5 mg/dL    Comment:        LOWEST DETECTABLE LIMIT FOR SERUM ALCOHOL IS 5 mg/dL FOR MEDICAL PURPOSES ONLY Performed at [facility]   Comprehensive metabolic panel     Status: Abnormal   Collection Time: 11/29/15  6:38 PM  Result Value Ref Range   Sodium 136 135 - 145 mmol/L   Potassium 3.6 3.5 - 5.1 mmol/L   Chloride 101 101 - 111 mmol/L   CO2 25 22 - 32 mmol/L   Glucose, Bld 107 (H) 65 - 99 mg/dL   BUN 9 6 - 20 mg/dL   Creatinine, Ser 0.91 0.61 - 1.24 mg/dL   Calcium 9.3 8.9 - 10.3 mg/dL   Total Protein 7.1  6.5 - 8.1 g/dL   Albumin 3.9 3.5 - 5.0 g/dL   AST 36 15 - 41 U/L   ALT 24 17 - 63 U/L   Alkaline Phosphatase 38 38 - 126 U/L   Total Bilirubin 0.8 0.3 - 1.2 mg/dL   GFR calc non Af Amer >60 >60 mL/min   GFR calc Af Amer >60 >60 mL/min    Comment: (NOTE) The eGFR has been calculated using the CKD EPI  equation. This calculation has not been validated in all clinical situations. eGFR's persistently <60 mL/min signify possible Chronic Kidney Disease.    Anion gap 10 5 - 15    Comment: Performed at [facility]   Physical Findings: AIMS: Facial and Oral Movements Muscles of Facial Expression: None, normal Lips and Perioral Area: None, normal Jaw: None, normal Tongue: None, normal,Extremity Movements Upper (arms, wrists, hands, fingers): None, normal Lower (legs, knees, ankles, toes): None, normal, Trunk Movements Neck, shoulders, hips: None, normal, Overall Severity Severity of abnormal movements (highest score from questions above): None, normal Incapacitation due to abnormal movements: None, normal [name] awareness of abnormal movements (rate only patient's report): No Awareness, Dental Status Current problems with teeth and/or dentures?: No Does patient usually wear dentures?: No  CIWA:  CIWA-Ar Total: 3 COWS:     Treatment Plan Summary: Daily contact with patient to assess and evaluate symptoms and progress in treatment and Medication management Daily contact with patient to assess and evaluate symptoms and progress in treatment and Medication management Will continue CIWA and Ativan Detox protocol.  Will continue Abilify 10 mg po bid for psychosis/mood sx.. Will continue Cymbalta 30 mg po daily for depression. Will make available PRN medications as per agitation protocol. Will continue to monitor vitals, medication compliance and treatment side effects while patient is here.  Will monitor for medical issues as well as call consult as needed.  Reviewed labs CBC, CMP potassium has increased and is now 3.6, UDS - Negative even though patient states he took Ativan prior to admission, Repeat BAL was negative (325 on admission).  CSW will continue working on disposition.  Patient to participate in therapeutic milieu .   [place], [place],  FNP-BC 12/01/2015, 3:04 PM Agree with NP note as above F. Cobos , MD

## 2015-12-01 NOTE — BHH Group Notes (Signed)
[place] LCSW Group Therapy  12/01/2015 1:15 pm  Type of Therapy: Process Group Therapy  Participation Level:  Active  Participation Quality:  Appropriate  Affect:  Flat  Cognitive:  Oriented  Insight:  Improving  Engagement in Group:  Limited  Engagement in Therapy:  Limited  Modes of Intervention:  Activity, Clarification, Education, Problem-solving and Support  Summary of Progress/Problems: Today's group addressed the issue of overcoming obstacles.  Patients were asked to identify their biggest obstacle post d/c that stands in the way of their on-going success, and then problem solve as to how to manage this. "I lost my job so I started drinking.  I wasn't taking any meds nor going to any appointments.  I lost my apartment and was living in hotels.  Your money goes fast when you are living in a motel."  States coming here was a good step towards stopping the cycle and getting back on track with meds.  "Drinking will not be an issue when I leave.  I only drink when I am real depressed, and I already feel less depressed since you started me back on my meds."  [name] 12/01/2015   3:00 PM

## 2015-12-01 NOTE — Progress Notes (Signed)
D. Pt has been up and visible in the milieu this evening, did attend evening group activity. Pt has appeared flat and depressed this evening, did complain of some anxiety and did request and receive medication. Pt spoke about how he is feeling better than when he came in and spoke about how he thinks he may be ready for discharge in the morning. A. Support and encouragement provided. R. Safety maintained, will continue to monitor.

## 2015-12-01 NOTE — Progress Notes (Signed)
Met with patient 1:1 this AM. Affect anxious, blunted with congruent mood. Rates his depression and hopelessness both at a 5/10, anxiety at a 6/10. He does endorse AVH in the form of "people coming in my room. I hear and see them. It's not scary but it is unnerving." Denies pain, physical problems. Reports his goal is to work on stress relief. Medicated per orders, education provided. Self inventory reviewed. Emotional support offered. Denies SI/HI and remains safe on level III obs.

## 2015-12-01 NOTE — BHH Suicide Risk Assessment (Signed)
B[facility] INPATIENT:  Family/Significant Other Suicide Prevention Education  Suicide Prevention Education:  Education Completed; Ms [name], mother  has been identified by the patient as the family member/significant other with whom the patient will be residing, and identified as the person(s) who will aid the patient in the event of a mental health crisis (suicidal ideations/suicide attempt).  With written consent from the patient, the family member/significant other has been provided the following suicide prevention education, prior to the and/or following the discharge of the patient.  The suicide prevention education provided includes the following:  Suicide risk factors  Suicide prevention and interventions  National Suicide Hotline telephone number  [facility] assessment telephone number  [facility] Emergency Assistance [place] and/or Residential Mobile Crisis Unit telephone number  Request made of family/significant other to:  Remove weapons (e.g., guns, rifles, knives), all items previously/currently identified as safety concern.    Remove drugs/medications (over-the-counter, prescriptions, illicit drugs), all items previously/currently identified as a safety concern.  The family member/significant other verbalizes understanding of the suicide prevention education information provided.  The family member/significant other agrees to remove the items of safety concern listed above.  Mother wants [name] to go to rehab since he was talking about it prior to admission and tried to get into [facility].  Furthermore, she called the PO to let him know where [name] was, and was told PO would support rehab, even though [name] states he needs to leave tomorrow to report in to PO.  [name] B 12/01/2015, 4:03 PM

## 2015-12-01 NOTE — BHH Group Notes (Signed)
Adult Psychoeducational Group Note  Date:  12/01/2015 Time:  9:12 PM  Group Topic/Focus:  Wrap-Up Group:   The focus of this group is to help patients review their daily goal of treatment and discuss progress on daily workbooks.  Participation Level:  Minimal  Participation Quality:  Appropriate  Affect:  Appropriate  Cognitive:  Appropriate  Insight: Good  Engagement in Group:  Limited  Modes of Intervention:  Discussion  Additional Comments:  Pt rated his day a 6.  Pt stated he attended all the groups and that today was okay.  Victorino Sparrow A 12/01/2015, 9:12 PM

## 2015-12-02 DIAGNOSIS — F1994 Other psychoactive substance use, unspecified with psychoactive substance-induced mood disorder: Secondary | ICD-10-CM | POA: Diagnosis present

## 2015-12-02 MED ORDER — HYDROXYZINE HCL 25 MG PO TABS: 25.0000 mg | ORAL_TABLET | Freq: Four times a day (QID) | ORAL | Status: DC | PRN

## 2015-12-02 MED ORDER — TRAZODONE HCL 100 MG PO TABS: 100.0000 mg | ORAL_TABLET | Freq: Every day | ORAL | Status: DC

## 2015-12-02 MED ORDER — ARIPIPRAZOLE 10 MG PO TABS: 10.0000 mg | ORAL_TABLET | Freq: Every day | ORAL | Status: DC

## 2015-12-02 MED ORDER — METOPROLOL TARTRATE 25 MG PO TABS: 25.0000 mg | ORAL_TABLET | Freq: Two times a day (BID) | ORAL | Status: DC

## 2015-12-02 MED ORDER — ATORVASTATIN CALCIUM 20 MG PO TABS: 20.0000 mg | ORAL_TABLET | ORAL | Status: DC

## 2015-12-02 MED ORDER — AMLODIPINE BESYLATE 5 MG PO TABS: 5.0000 mg | ORAL_TABLET | Freq: Every day | ORAL | Status: DC

## 2015-12-02 MED ORDER — DULOXETINE HCL 30 MG PO CPEP: 30.0000 mg | ORAL_CAPSULE | Freq: Every day | ORAL | Status: DC

## 2015-12-02 NOTE — Plan of Care (Signed)
Problem: Ineffective individual coping Goal: STG: Patient will participate in after care plan Outcome: Progressing Patient working with CM to develop discharge plan. Expresses interest in continuing substance abuse tx.   Problem: Diagnosis: Increased Risk For Suicide Attempt Goal: STG-Patient Will Attend All Groups On The Unit Outcome: Not Progressing Patient attending some but not all groups.

## 2015-12-02 NOTE — Progress Notes (Signed)
Adult Psychoeducational Group Note  Date:  12/02/2015 Time:  9:02 PM  Group Topic/Focus:  Wrap-Up Group:   The focus of this group is to help patients review their daily goal of treatment and discuss progress on daily workbooks.  Participation Level:  Active  Participation Quality:  Appropriate  Affect:  Appropriate  Cognitive:  Appropriate  Insight: Appropriate  Engagement in Group:  Engaged  Modes of Intervention:  Discussion  Additional Comments: The patient expressed that he had a good day.The patient also said that he enjoed the groups today. [name] 12/02/2015, 9:02 PM

## 2015-12-02 NOTE — Progress Notes (Signed)
D: Pt is flat, isolative and withdrawn to room. Pt has become preoccupied about going home-"I have an appointment with my probation officer tomorrow; I can't miss the appointment." Pt however endorses mild depression but denies anxiety, SI, HI, pain or AVH. Pt remained calm and cooperative. A: Medications offered as prescribed.  Support, encouragement, and safe environment provided.  15-minute safety checks continue. R: Pt was med compliant.  Pt did not attend wrap-up group. Safety checks continue.

## 2015-12-02 NOTE — B[facility] Group Notes (Signed)
[place] LCSW Group Therapy  12/02/2015 , 1:13 PM   Type of Therapy:  Group Therapy  Participation Level:  Active  Participation Quality:  Attentive  Affect:  Appropriate  Cognitive:  Alert  Insight:  Improving  Engagement in Therapy:  Engaged  Modes of Intervention:  Discussion, Exploration and Socialization  Summary of Progress/Problems: Today's group focused on the term Diagnosis.  Participants were asked to define the term, and then pronounce whether it is a negative, positive or neutral term. Invited.  Chose to not attend. [name] 12/02/2015 , 1:13 PM

## 2015-12-02 NOTE — BHH Group Notes (Signed)
[place] Group Notes:  (Nursing/MHT/Case Management/Adjunct)  Date:  12/02/2015  Time:  0930  Type of Therapy:  Nurse Education - Recovery  Participation Level:  Did not attend  Participation Quality:    Affect:    Cognitive:    Insight:    Engagement in Group:    Modes of Intervention:    Summary of Progress/Problems: Patient was invited to nursing education group however elected not to attend.     Loletta Specter [facility] 12/02/2015, 10:37 AM

## 2015-12-02 NOTE — Progress Notes (Signed)
[facility] MD Progress Note  12/02/2015 11:09 AM [name]  MRN:  [id number]  Subjective:  [name] states, "I thought I was going home today.  I have a meeting with my probation offcer tomorrow.  They said that I can get a letter saying that I was at the hospital, so it;s ok if I stay."   He reported good sleep.  Stated that he lived with his parents and they are his support system.    Objective:  Patient was seen and chart reviewed. Patient is a 35 years old single AA male admitted voluntarily from [facility] for worsening depression, substance abuse and status post suicide attempt. Patient reported he has been depressed for years which was recently exacerbated because of the death of his friend who passed away, job loss, and housing conditions.   Principal Problem: Alcohol use disorder, severe, dependence ([place]) Diagnosis:   Patient Active Problem List   Diagnosis Date Noted  . Mood disorder in conditions classified elsewhere [F06.30]   . MDD (major depressive disorder), recurrent severe, without psychosis ([place]) [F33.2] 10/08/2015  . Malnutrition of moderate degree [E44.0] 09/24/2015  . Alcohol withdrawal ([place]) [F10.239] 09/23/2015  . Tobacco use disorder [F17.200] 07/16/2015  . Elevated transaminase level [R74.0]   . Drug overdose, intentional ([place]) [T50.902A] 07/12/2015  . Alcohol use disorder, severe, dependence ([place]) [F10.20] 04/15/2015  . Cocaine abuse with cocaine-induced mood disorder ([place]) [F14.14] 04/11/2015  . Overdose [T50.901A] 04/10/2015  . Severe recurrent major depressive disorder with psychotic features ([place]) [F33.3]   . Severe major depression with psychotic features ([place]) [F32.3] 09/11/2014  . Alcohol-induced mood disorder ([name]) [F10.94] 09/10/2014  . Suicidal ideation [R45.851]   . Tylenol overdose [T39.1X4A]   . Polysubstance abuse [F19.10]   . Overdose of acetaminophen [T39.1X4A] 08/03/2014  . Overdose by acetaminophen [T39.1X4A] 08/03/2014  . Cocaine abuse [F14.10] 04/16/2014   . Thrombocytopenia ([place]) [D69.6] 04/15/2014  . Urinary tract infection, site not specified [N39.0] 04/15/2014  . Chest pain, unspecified [R07.9] 04/15/2014  . D-dimer, elevated [R79.1] 04/15/2014  . Transaminitis [R74.0] 09/24/2013  . Cocaine dependence ([place]) [F14.20] 09/20/2013  . 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 Seizure ([place]) [R56.9] 03/15/2013  . Syncope [R55] 01/02/2013  . Leukocytopenia, unspecified [D72.819] 01/02/2013  . Left kidney mass [N28.89] 12/24/2012  . PTSD (post-traumatic stress disorder) [F43.10] 07/06/2012  . Major depressive disorder, recurrent episode ([place]) [F33.9] 06/29/2012    Class: Acute  . Peripheral vascular disease ([place]) [I73.9] 01/14/2012  . SEIZURE DISORDER [R56.9] 10/03/2008  . LIVER FUNCTION TESTS, ABNORMAL [R94.5] 12/29/2007  . HYPERCHOLESTEROLEMIA [E78.00] 03/21/2007  . Essential hypertension [I10] 03/21/2007   Total Time spent with patient: 15 minutes  Past Medical History:  Past Medical History  Diagnosis Date  . Seizures ([place])   . Hypertension   . Depression   . Pancreatitis   . Liver cirrhosis ([place])   . Coronary artery disease   . Angina   . Shortness of breath   . Headache(784.0)   . Peripheral vascular disease [facility]) April 2011    Left Pop  . Hypercholesteremia   . Schizophrenia ([place])   . Bipolar 1 disorder ([place])   . Cancer of kidney ([place]) dx'd 04/2013    lt nephrectomy  . Breast CA ([place]) dx'd 2009    bil w/ bil masectomy and oral meds  . H/O suicide attempt 2015    overdose    Past Surgical History  Procedure Laterality Date  . Chest surgery    . Left leg surgery    .  Mastectomy    . Left kidney removal    . Breast surgery     Family History:  Family History  Problem Relation Age of Onset  . Stroke Other   . Cancer Other   . Hyperlipidemia Mother   . Hypertension Mother    Social History:  History  Alcohol Use  . 0.0 oz/week     History  Drug Use No    Comment: former    Social History   Social  History  . Marital Status: Single    Spouse Name: N/A  . Number of Children: N/A  . Years of Education: N/A   Social History Main Topics  . Smoking status: Current Every Day Smoker  . Smokeless tobacco: None  . Alcohol Use: 0.0 oz/week  . Drug Use: No     Comment: former  . Sexual Activity: Yes    Birth Control/ Protection: Condom     Comment: anal   Other Topics Concern  . None   Social History Narrative   ** Merged History Encounter **       Additional History:    Sleep: Good  Appetite:  Fair  Assessment:   Musculoskeletal: Strength & Muscle Tone: within normal limits Gait & Station: normal Patient leans: N/A  Psychiatric Specialty Exam: Physical Exam  Psychiatric: His mood appears anxious.    Review of Systems  Constitutional: Negative.   HENT: Negative.   Eyes: Negative.   Respiratory: Negative.   Cardiovascular: Negative.   Gastrointestinal: Negative.   Genitourinary: Negative.   Musculoskeletal: Negative.   Skin: Negative.   Neurological: Negative.   Endo/Heme/Allergies: Negative.   Psychiatric/Behavioral: Negative for depression. The patient is nervous/anxious and has insomnia.     Blood pressure 110/88, pulse 85, temperature 97.9 F (36.6 C), temperature source Oral, resp. rate 18, height 5\' 6"  (1.676 m), weight 67.586 kg (149 lb), SpO2 100 %.Body mass index is 24.06 kg/(m^2).  General Appearance: Fairly Groomed  Engineer, water::  Fair  Speech:  Clear and Coherent  Volume:  Normal  Mood:  Depressed  Affect:  anxious worried  Thought Process:  Coherent and Goal Directed  Orientation:  Full (Time, Place, and Person)  Thought Content:  symptoms events worries concerns  Suicidal Thoughts:  No  Homicidal Thoughts:  No  Memory:  Immediate;   Fair Recent;   Fair Remote;   Fair  Judgement:  Fair  Insight:  Present and Shallow  Psychomotor Activity:  Normal  Concentration:  Fair  Recall:  [employer] of Knowledge:Fair  Language: Fair  Akathisia:   No  Handed:  Right  AIMS (if indicated):     Assets:  Desire for Improvement Housing Vocational/Educational  ADL's:  Intact  Cognition: WNL  Sleep:  Number of Hours: 4   Current Medications: Current Facility-Administered Medications  Medication Dose Route Frequency Provider Last Rate Last Dose  . alum & mag hydroxide-simeth (MAALOX/MYLANTA) 200-200-20 MG/5ML suspension 30 mL  30 mL Oral PRN Delfin Gant, NP      . amLODipine (NORVASC) tablet 5 mg  5 mg Oral Daily Delfin Gant, NP   5 mg at 12/02/15 0815  . ARIPiprazole (ABILIFY) tablet 10 mg  10 mg Oral Daily Delfin Gant, NP   10 mg at 12/02/15 0815  . atorvastatin (LIPITOR) tablet 20 mg  20 mg Oral BH-q7a Delfin Gant, NP   20 mg at 12/02/15 [id number]  . DULoxetine (CYMBALTA) DR capsule 30 mg  30 mg Oral  Daily Delfin Gant, NP   30 mg at 12/02/15 0815  . hydrOXYzine (ATARAX/VISTARIL) tablet 25 mg  25 mg Oral Q6H PRN Nanci Pina, FNP   25 mg at 12/02/15 0059  . ibuprofen (ADVIL,MOTRIN) tablet 600 mg  600 mg Oral Q8H PRN Delfin Gant, NP   600 mg at 12/01/15 1348  . loperamide (IMODIUM) capsule 2-4 mg  2-4 mg Oral PRN Nanci Pina, FNP      . LORazepam (ATIVAN) tablet 1 mg  1 mg Oral Q6H PRN Nanci Pina, FNP      . LORazepam (ATIVAN) tablet 1 mg  1 mg Oral BID Nanci Pina, FNP   1 mg at 12/02/15 0815   Followed by  . [START ON 12/03/2015] LORazepam (ATIVAN) tablet 1 mg  1 mg Oral Daily Nanci Pina, FNP      . metoprolol tartrate (LOPRESSOR) tablet 25 mg  25 mg Oral BID Delfin Gant, NP   25 mg at 12/02/15 0818  . multivitamin with minerals tablet 1 tablet  1 tablet Oral Daily Nanci Pina, FNP   1 tablet at 12/02/15 0815  . ondansetron (ZOFRAN-ODT) disintegrating tablet 4 mg  4 mg Oral Q6H PRN Nanci Pina, FNP      . thiamine (B-1) injection 100 mg  100 mg Intramuscular Once Nanci Pina, FNP   100 mg at 11/29/15 1430  . thiamine (VITAMIN B-1) tablet 100 mg  100 mg Oral  Daily Nanci Pina, FNP   100 mg at 12/02/15 0815  . traZODone (DESYREL) tablet 100 mg  100 mg Oral QHS Delfin Gant, NP   100 mg at 12/01/15 2143   Lab Results:  No results found for this or any previous visit (from the past 62 hour(s)). Physical Findings: AIMS: Facial and Oral Movements Muscles of Facial Expression: None, normal Lips and Perioral Area: None, normal Jaw: None, normal Tongue: None, normal,Extremity Movements Upper (arms, wrists, hands, fingers): None, normal Lower (legs, knees, ankles, toes): None, normal, Trunk Movements Neck, shoulders, hips: None, normal, Overall Severity Severity of abnormal movements (highest score from questions above): None, normal Incapacitation due to abnormal movements: None, normal [name] awareness of abnormal movements (rate only patient's report): No Awareness, Dental Status Current problems with teeth and/or dentures?: No Does patient usually wear dentures?: No  CIWA:  CIWA-Ar Total: 2 COWS:     Treatment Plan Summary: Daily contact with patient to assess and evaluate symptoms and progress in treatment and Medication management Will continue CIWA and Ativan Detox protocol.  Will continue Abilify 10 mg po bid for psychosis/mood sx.. Will continue Cymbalta 30 mg po daily for depression. Will make available PRN medications as per agitation protocol. Will continue to monitor vitals, medication compliance and treatment side effects while patient is here.  Will monitor for medical issues as well as call consult as needed.  Reviewed labs CBC, CMP potassium has increased and is now 3.6, UDS - Negative even though patient states he took Ativan prior to admission, Repeat BAL was negative (325 on admission).  CSW will continue working on disposition.  Patient to participate in therapeutic milieu .   [place], AGNP-BC 12/02/2015, 11:09 AM Agree with NP note as above F. Amely Voorheis , MD

## 2015-12-02 NOTE — Progress Notes (Signed)
D:Patient in the dayroom on approach.  Patient states he is feeling ok.  Patient states he remains sad because he had a friend who passed away, loss of job, and he lost his housing.  Patient states he is stressed.  Patient states his goal for today was to take it easy and hopefully he will be discharged tomorrow.  Patient states he will be able to get into some form of long term treatment.  Patient denies SI/HI and denies AVH. A: Staff to monitor Q 15 mins for safety.  Encouragement and support offered.  Scheduled medications administered per orders. R: Patient remains safe on the unit.  Patient attended group tonight.  Patient visible on the unit for group and snack tonight.  Patient taking administered medications.

## 2015-12-02 NOTE — Discharge Summary (Signed)
Physician Discharge Summary Note  Patient:  [name] is an 35 y.o., male MRN:  [id number] DOB:  07-20-1980 Patient phone:  [phone] (home)  Patient address:   [address]  [place] [address],  Total Time spent with patient: 30 minutes  Date of Admission:  11/28/2015 Date of Discharge: 12/02/2015  Reason for Admission:  Suicide attempt by alcohol and overdose Ativan  Principal Problem: Alcohol use disorder, severe, dependence [facility]) Discharge Diagnoses: Patient Active Problem List   Diagnosis Date Noted  . Mood disorder in conditions classified elsewhere [F06.30]   . MDD (major depressive disorder), recurrent severe, without psychosis ([place]) [F33.2] 10/08/2015  . Malnutrition of moderate degree [E44.0] 09/24/2015  . Alcohol withdrawal ([place]) [F10.239] 09/23/2015  . Tobacco use disorder [F17.200] 07/16/2015  . Elevated transaminase level [R74.0]   . Drug overdose, intentional ([place]) [T50.902A] 07/12/2015  . Alcohol use disorder, severe, dependence ([place]) [F10.20] 04/15/2015  . Cocaine abuse with cocaine-induced mood disorder ([place]) [F14.14] 04/11/2015  . Overdose [T50.901A] 04/10/2015  . Severe recurrent major depressive disorder with psychotic features ([place]) [F33.3]   . Severe major depression with psychotic features ([place]) [F32.3] 09/11/2014  . Alcohol-induced mood disorder ([place]) [F10.94] 09/10/2014  . Suicidal ideation [R45.851]   . Tylenol overdose [T39.1X4A]   . Polysubstance abuse [F19.10]   . Overdose of acetaminophen [T39.1X4A] 08/03/2014  . Overdose by acetaminophen [T39.1X4A] 08/03/2014  . Cocaine abuse [F14.10] 04/16/2014  . Thrombocytopenia ([place]) [D69.6] 04/15/2014  . Urinary tract infection, site not specified [N39.0] 04/15/2014  . Chest pain, unspecified [R07.9] 04/15/2014  . D-dimer, elevated [R79.1] 04/15/2014  . Transaminitis [R74.0] 09/24/2013  . Cocaine dependence ([place]) [F14.20] 09/20/2013  . 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 Seizure ([place]) [R56.9]  03/15/2013  . Syncope [R55] 01/02/2013  . Leukocytopenia, unspecified [D72.819] 01/02/2013  . Left kidney mass [N28.89] 12/24/2012  . PTSD (post-traumatic stress disorder) [F43.10] 07/06/2012  . Major depressive disorder, recurrent episode ([place]) [F33.9] 06/29/2012    Class: Acute  . Peripheral vascular disease ([place]) [I73.9] 01/14/2012  . SEIZURE DISORDER [R56.9] 10/03/2008  . LIVER FUNCTION TESTS, ABNORMAL [R94.5] 12/29/2007  . HYPERCHOLESTEROLEMIA [E78.00] 03/21/2007  . Essential hypertension [I10] 03/21/2007   Past Psychiatric History:  See above noted  Past Medical History:  Past Medical History  Diagnosis Date  . Seizures ([place])   . Hypertension   . Depression   . Pancreatitis   . Liver cirrhosis ([place])   . Coronary artery disease   . Angina   . Shortness of breath   . Headache(784.0)   . Peripheral vascular disease [facility]) April 2011    Left Pop  . Hypercholesteremia   . Schizophrenia ([place])   . Bipolar 1 disorder ([place])   . Cancer of kidney ([place]) dx'd 04/2013    lt nephrectomy  . Breast CA ([place]) dx'd 2009    bil w/ bil masectomy and oral meds  . H/O suicide attempt 2015    overdose    Past Surgical History  Procedure Laterality Date  . Chest surgery    . Left leg surgery    . Mastectomy    . Left kidney removal    . Breast surgery     Family History:  Family History  Problem Relation Age of Onset  . Stroke Other   . Cancer Other   . Hyperlipidemia Mother   . Hypertension Mother    Family Psychiatric  History:  See above noted Social History:  History  Alcohol Use  . 0.0 oz/week  History  Drug Use No    Comment: former    Social History   Social History  . Marital Status: Single    Spouse Name: N/A  . Number of Children: N/A  . Years of Education: N/A   Social History Main Topics  . Smoking status: Current Every Day Smoker  . Smokeless tobacco: None  . Alcohol Use: 0.0 oz/week  . Drug Use: No     Comment: former  . Sexual Activity: Yes     Birth Control/ Protection: Condom     Comment: anal   Other Topics Concern  . None   Social History Narrative   ** Merged History Encounter **        Hospital Course:  [name] is an 35 y.o. male. Pt presented voluntarily to [facility] after drinking etoh and ingesting Ativan at home in the presence of his mother.  Pt's BAL was 325 upon admission to [facility].    [name] was admitted for Alcohol use disorder, severe, dependence ([place]) and crisis management.  He was treated with meds listed below.  Medical problems were identified and treated as needed.  Home medications were restarted as appropriate.  Improvement was monitored by observation and [name] daily report of symptom reduction.  Emotional and mental status was monitored by daily self inventory reports completed by [name] and clinical staff.  Patient reported continued improvement, denied any new concerns.  Patient had been compliant on medications and denied side effects.  Support and encouragement was provided.    Patient did well during inpatient stay.  At time of discharge, patient rated both depression and anxiety levels to be manageable and minimal.  Patient was able to identify the triggers of emotional crises and de-stabilizations.  Patient identified the positive things in life that would help in dealing with feelings of loss, depression and unhealthy or abusive tendencies.         [name] was evaluated by the treatment team for stability and plans for continued recovery upon discharge.  He was offered further treatment options upon discharge including Residential, Intensive Outpatient and Outpatient treatment.  He will follow up with agencies listed below for medication management and counseling.  Encouraged patient to maintain satisfactory support network and home environment.  Advised to adhere to medication compliance and outpatient treatment follow up.      [name] motivation was an integral factor for  scheduling further treatment.  Employment, transportation, bed availability, health status, family support, and any pending legal issues were also considered during his hospital stay.  Upon completion of this admission the patient was both mentally and medically stable for discharge denying suicidal/homicidal ideation, auditory/visual/tactile hallucinations, delusional thoughts and paranoia.      Physical Findings: AIMS: Facial and Oral Movements Muscles of Facial Expression: None, normal Lips and Perioral Area: None, normal Jaw: None, normal Tongue: None, normal,Extremity Movements Upper (arms, wrists, hands, fingers): None, normal Lower (legs, knees, ankles, toes): None, normal, Trunk Movements Neck, shoulders, hips: None, normal, Overall Severity Severity of abnormal movements (highest score from questions above): None, normal Incapacitation due to abnormal movements: None, normal [name] awareness of abnormal movements (rate only patient's report): No Awareness, Dental Status Current problems with teeth and/or dentures?: No Does patient usually wear dentures?: No  CIWA:  CIWA-Ar Total: 2 COWS:     Musculoskeletal: Strength & Muscle Tone: within normal limits Gait & Station: normal Patient leans: N/A  Psychiatric Specialty Exam: Review of Systems  Psychiatric/Behavioral: Negative for  depression and hallucinations. The patient is not nervous/anxious.   All other systems reviewed and are negative.   Blood pressure 110/88, pulse 85, temperature 97.9 F (36.6 C), temperature source Oral, resp. rate 18, height 5\' 6"  (1.676 m), weight 67.586 kg (149 lb), SpO2 100 %.Body mass index is 24.06 kg/(m^2).  Have you used any form of tobacco in the last 30 days? (Cigarettes, Smokeless Tobacco, Cigars, and/or Pipes): Yes  Has this patient used any form of tobacco in the last 30 days? (Cigarettes, Smokeless Tobacco, Cigars, and/or Pipes) Yes, NA  Blood Alcohol level:  Lab Results  Component  Value Date   ETH <5 11/29/2015   ETH 325* [id number]    Metabolic Disorder Labs:  Lab Results  Component Value Date   HGBA1C 5.3 09/11/2014   MPG 105 09/11/2014   MPG 100 12/24/2012   No results found for: PROLACTIN Lab Results  Component Value Date   CHOL 321* 09/11/2014   TRIG 134 07/15/2015   HDL 77 09/11/2014   CHOLHDL 4.2 09/11/2014   VLDL 15 09/11/2014   LDLCALC 229* 09/11/2014   LDLCALC 181* 04/16/2014    See Psychiatric Specialty Exam and Suicide Risk Assessment completed by Attending Physician prior to discharge.  Discharge destination:  Home  Is patient on multiple antipsychotic therapies at discharge:  No   Has Patient had three or more failed trials of antipsychotic monotherapy by history:  No  Recommended Plan for Multiple Antipsychotic Therapies: NA     Medication List    STOP taking these medications        LORazepam 1 MG tablet  Commonly known as:  ATIVAN      TAKE these medications      Indication   amLODipine 5 MG tablet  Commonly known as:  NORVASC  Take 1 tablet (5 mg total) by mouth daily.   Indication:  High Blood Pressure     ARIPiprazole 10 MG tablet  Commonly known as:  ABILIFY  Take 1 tablet (10 mg total) by mouth daily.   Indication:  Major Depressive Disorder     atorvastatin 20 MG tablet  Commonly known as:  LIPITOR  Take 1 tablet (20 mg total) by mouth every morning.   Indication:  High Amount of Triglycerides in the Blood     DULoxetine 30 MG capsule  Commonly known as:  CYMBALTA  Take 1 capsule (30 mg total) by mouth daily.   Indication:  Major Depressive Disorder, mood stabilization     hydrOXYzine 25 MG tablet  Commonly known as:  ATARAX/VISTARIL  Take 1 tablet (25 mg total) by mouth every 6 (six) hours as needed for anxiety.   Indication:  Anxiety Neurosis     metoprolol tartrate 25 MG tablet  Commonly known as:  LOPRESSOR  Take 1 tablet (25 mg total) by mouth 2 (two) times daily.   Indication:  High Blood  Pressure     traZODone 100 MG tablet  Commonly known as:  DESYREL  Take 1 tablet (100 mg total) by mouth at bedtime.   Indication:  Trouble Sleeping       Follow-up recommendations:  Activity:  as tol Diet:  as tol  Comments:  1.  Take all your medications as prescribed.   2.  Report any adverse side effects to outpatient provider. 3.  Patient instructed to not use alcohol or illegal drugs while on prescription medicines. 4.  In the event of worsening symptoms, instructed patient to call 911, the crisis hotline  or go to nearest emergency room for evaluation of symptoms.  Signed: Janett Labella, NP [facility] 12/02/2015, 10:24 AM   Patient seen, Suicide Assessment Completed.  Disposition Plan Reviewed

## 2015-12-02 NOTE — Progress Notes (Signed)
Found patient resting in bed. Promptly arose for AM meds. Denies pain, physical issues this AM. Rates his depression, hopelessness and anxiety all at a 5/10. Reports his goal is to "go home or to get into a long term program (2 years)." Affect anxious with congruent mood. Patient pleasant and cooperative. Medicated per orders, education provided. Self inventory provided. Fall risk precautions reviewed and in place. Emotional support given. He denies SI/HI as well as AVH. Patient remains safe on level III obs.

## 2015-12-03 NOTE — Progress Notes (Signed)
Pt discharged home with his mother. Pt was ambulatory, stable and appreciative at that time. All papers and prescriptions were given and valuables returned. Verbal understanding expressed. Denies SI/HI and A/VH. Pt given opportunity to express concerns and ask questions.

## 2015-12-03 NOTE — Tx Team (Signed)
Interdisciplinary Treatment Plan Update (Adult)  Date:  12/03/2015   Time Reviewed:  11:12 AM   Progress in Treatment: Attending groups: Yes. Participating in groups:  Yes. Taking medication as prescribed:  Yes. Tolerating medication:  Yes. Family/Significant other contact made: Yes Patient understands diagnosis:  Yes  As evidenced by seeking help with depression, anxiety and SI Discussing patient identified problems/goals with staff:  Yes, see initial care plan. Medical problems stabilized or resolved:  Yes. Denies suicidal/homicidal ideation: Yes. Issues/concerns per patient self-inventory:  No. Other:  New problem(s) identified:  Discharge Plan or Barriers: Pt already asking for d/c as he has a meeting with PO tomorrow  Reason for Continuation of Hospitalization:   Comments:  35 year old male who presents, in no acute distress, for the treatment of SI, Depression, and Substance Abuse. Patient appears flat, sad, and depressed. Patient was calm and cooperative with admission process. Patient reports that he tried to kill himself "off of Ativan and alcohol". Patient is unable to recall how many Ativan he took. Patient states that he does not remember what happened after taking the Ativan; "It's all just black to me". Patient reports "I have no motivation to live". Patient currently presents with passive SI and contracts for safety upon admission stating "I'm going to keep myself safe while I'm here". Will start CIWA and Ativan Detox protocol.  Will continue Abilify 10 mg po bid for psychosis/mood sx.. Will continue Cymbalta 3010 mg po daily for depression.  Estimated length of stay: D/C today  New goal(s): Unable to get into [facility] due to court date next week.  Pt also was just opened to the probation dpt, but PO unable to get continuation to hearing  Review of initial/current patient goals per problem list:   Review of initial/current patient goals per problem list:  1.  Goal(s): Patient will participate in aftercare plan   Met: Yes   Target date: 3-5 days post admission date   As evidenced by: Patient will participate within aftercare plan AEB aftercare provider and housing plan at discharge being identified. 12/01/15:  Return home, follow up outpt   2. Goal (s): Patient will exhibit decreased depressive symptoms and suicidal ideations.   Met:Yes   Target date: 3-5 days post admission date   As evidenced by: Patient will utilize self rating of depression at 3 or below and demonstrate decreased signs of depression or be deemed stable for discharge by MD. 12/01/15:  Rates his depression a 5 today. 5/17/17[name] denies depression today     3. Goal(s): Patient will demonstrate decreased signs and symptoms of anxiety.   Met: Yes   Target date: 3-5 days post admission date   As evidenced by: Patient will utilize self rating of anxiety at 3 or below and demonstrated decreased signs of anxiety, or be deemed stable for discharge by MD 12/01/15:  Rates his anxiety a 6 today. 5/17/17[name] denies anxiety today     4. Goal(s): Patient will demonstrate decreased signs of withdrawal due to substance abuse   Met: Yes   Target date: 3-5 days post admission date   As evidenced by: Patient will produce a CIWA/COWS score of 0, have stable vitals signs, and no symptoms of withdrawal 12/01/15:  CIWA score of 3 today.  On Ativan detox protocol.  Getting ativan tid today. 12/03/15:  No signs nor symptoms of withdrawal today     5. Goal(s): Patient will demonstrate decreased signs of psychosis  * Met: Yes  *  Target date: 3-5 days post admission date  * As evidenced by: Patient will demonstrate decreased frequency of AVH or return to baseline function 12/01/15:  Denies AH, VH today          Attendees: Patient:  12/03/2015 11:12 AM   Family:   12/03/2015 11:12 AM   Physician:  Ursula Alert, MD 12/03/2015 11:12 AM   Nursing:    Eulogio Bear, RN 12/03/2015 11:12 AM   CSW:    Roque Lias, LCSW   12/03/2015 11:12 AM   Other:  12/03/2015 11:12 AM   Other:   12/03/2015 11:12 AM   Other:  Lars Pinks, Nurse CM 12/03/2015 11:12 AM   Other:   12/03/2015 11:12 AM   Other:  Norberto Sorenson, [place]  12/03/2015 11:12 AM   Other:  12/03/2015 11:12 AM   Other:  12/03/2015 11:12 AM   Other:  12/03/2015 11:12 AM   Other:  12/03/2015 11:12 AM   Other:  12/03/2015 11:12 AM   Other:   12/03/2015 11:12 AM    Scribe for Treatment Team:   [name], 12/03/2015 11:12 AM

## 2015-12-03 NOTE — BHH Suicide Risk Assessment (Addendum)
[facility] Discharge Suicide Risk Assessment   Principal Problem: Alcohol use disorder, severe, dependence ([place]) Discharge Diagnoses:  Patient Active Problem List   Diagnosis Date Noted  . Substance induced mood disorder ([place]) [F19.94] 12/02/2015  . Mood disorder in conditions classified elsewhere [F06.30]   . MDD (major depressive disorder), recurrent severe, without psychosis ([place]) [F33.2] 10/08/2015  . Malnutrition of moderate degree [E44.0] 09/24/2015  . Alcohol withdrawal ([place]) [F10.239] 09/23/2015  . Tobacco use disorder [F17.200] 07/16/2015  . Elevated transaminase level [R74.0]   . Drug overdose, intentional ([place]) [T50.902A] 07/12/2015  . Alcohol use disorder, severe, dependence ([place]) [F10.20] 04/15/2015  . Cocaine abuse with cocaine-induced mood disorder ([place]) [F14.14] 04/11/2015  . Overdose [T50.901A] 04/10/2015  . Severe recurrent major depressive disorder with psychotic features ([place]) [F33.3]   . Severe major depression with psychotic features ([place]) [F32.3] 09/11/2014  . Alcohol-induced mood disorder ([place]) [F10.94] 09/10/2014  . Suicidal ideation [R45.851]   . Tylenol overdose [T39.1X4A]   . Polysubstance abuse [F19.10]   . Overdose of acetaminophen [T39.1X4A] 08/03/2014  . Overdose by acetaminophen [T39.1X4A] 08/03/2014  . Cocaine abuse [F14.10] 04/16/2014  . Thrombocytopenia ([place]) [D69.6] 04/15/2014  . Urinary tract infection, site not specified [N39.0] 04/15/2014  . Chest pain, unspecified [R07.9] 04/15/2014  . D-dimer, elevated [R79.1] 04/15/2014  . Transaminitis [R74.0] 09/24/2013  . Cocaine dependence ([place]) [F14.20] 09/20/2013  . 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 Seizure ([place]) [R56.9] 03/15/2013  . Syncope [R55] 01/02/2013  . Leukocytopenia, unspecified [D72.819] 01/02/2013  . Left kidney mass [N28.89] 12/24/2012  . PTSD (post-traumatic stress disorder) [F43.10] 07/06/2012  . Major depressive disorder, recurrent episode ([place]) [F33.9] 06/29/2012    Class: Acute   . Peripheral vascular disease ([place]) [I73.9] 01/14/2012  . SEIZURE DISORDER [R56.9] 10/03/2008  . LIVER FUNCTION TESTS, ABNORMAL [R94.5] 12/29/2007  . HYPERCHOLESTEROLEMIA [E78.00] 03/21/2007  . Essential hypertension [I10] 03/21/2007    Total Time spent with patient: 30 minutes  Musculoskeletal: Strength & Muscle Tone: within normal limits- no current symptoms of withdrawal- no tremors, no diaphoresis, vitals are stable  Gait & Station: normal Patient leans: N/A  Psychiatric Specialty Exam: ROS no headache, no chest pain, no shortness of breath, denies vomiting   Blood pressure 103/78, pulse 75, temperature 97.6 F (36.4 C), temperature source Oral, resp. rate 16, height 5\' 6"  (1.676 m), weight 149 lb (67.586 kg), SpO2 100 %.Body mass index is 24.06 kg/(m^2).  General Appearance: Fairly Groomed  Engineer, water::  Good  Speech:  Normal Rate409  Volume:  Normal  Mood:  reports mood is improved, feels better today,at this time minimizes depression  Affect:  appropriate, reactive   Thought Process:  Linear  Orientation:  Other:  fully alert and attentive   Thought Content:  denies hallucinations, no delusions  Suicidal Thoughts:  No- denies suicidal ideations,  Denies any self injurious ideations, denies any violent or homicidal ideations  Homicidal Thoughts:  No  Memory:  recent and remote grossly intact   Judgement:   improved  Insight:  improved   Psychomotor Activity:  Normal  Concentration:  Good  Recall:  Good  Fund of Knowledge:Good  Language: Good  Akathisia:  Negative  Handed:  Right  AIMS (if indicated):   no akathisia, no abnormal , involuntary movements noted or reported at this time  Assets:  Desire for Improvement Resilience  Sleep:  Number of Hours: 6.75  Cognition: WNL  ADL's:  Intact   Mental Status Per Nursing Assessment::   On Admission:  Suicidal ideation indicated  by patient, Suicide plan, Self-harm thoughts, Self-harm behaviors  Demographic Factors:   35 year old male   Loss Factors: Homelessness , substance abuse   Historical Factors: History of alcohol and cocaine abuse, history of depression/substance induced mood disorder, prior psychiatric admissions   Risk Reduction Factors:   Positive coping skills or problem solving skills  Continued Clinical Symptoms:  At this time patient improved compared to admission- mood improved, affect fuller in range, denies depression at this time, no thought disorder, denies any suicidal ideations, denies any homicidal ideations, no psychotic symptoms, future oriented .  Denies medication side effects at this time. No current symptoms or alcohol WDL at this time Denies cravings and states he is motivated in recovery/abstinence   Cognitive Features That Contribute To Risk:  No gross cognitive deficits noted upon discharge. Is alert , attentive, and oriented x 3   Suicide Risk:  Mild:  Suicidal ideation of limited frequency, intensity, duration, and specificity.  There are no identifiable plans, no associated intent, mild dysphoria and related symptoms, good self-control (both objective and subjective assessment), few other risk factors, and identifiable protective factors, including available and accessible social support.  Follow-up Information    Follow up with [facility] On 12/17/2015.   Specialty:  Behavioral Health   Why:  Wednesday at 10:45 with Dr Mallie Mussel information:   [place] [address] [phone]       Plan Of Care/Follow-up recommendations:  Activity:  as tolerated Diet:  [place] Healthy Tests:  NA Other:  see below  Patient is leaving in good spirits, plans to follow up as above Encouraged to continue focusing on recovery, relapse prevention, and to go to [employer] Neita Garnet, MD 12/03/2015, 1:17 PM

## 2015-12-03 NOTE — Progress Notes (Addendum)
  [facility] Adult Case Management Discharge Plan :  Will you be returning to the same living situation after discharge:  No. States he will stay with brother At discharge, do you have transportation home?: Yes,  family Do you have the ability to pay for your medications: Yes,  MCD  Release of information consent forms completed and in the chart;  [name] signature needed at discharge.  Patient to Follow up at: Follow-up Information    Follow up with [facility] On 12/17/2015.   Specialty:  Behavioral Health   Why:  Wednesday at 10:45 with Dr Mallie Mussel information:   [place] [address] [phone]       Next level of care provider has access to [place] and Suicide Prevention discussed: Yes,  yes  Have you used any form of tobacco in the last 30 days? (Cigarettes, Smokeless Tobacco, Cigars, and/or Pipes): Yes  Has patient been referred to the Quitline?: Patient refused referral  Patient has been referred for addiction treatment: Yes  [name] 12/03/2015, 11:07 AM

## 2015-12-03 NOTE — Tx Team (Signed)
Interdisciplinary Treatment Plan Update (Adult)  Date:  12/03/2015   Time Reviewed:  11:08 AM   Progress in Treatment: Attending groups: Yes. Participating in groups:  Yes. Taking medication as prescribed:  Yes. Tolerating medication:  Yes. Family/Significant other contact made: Yes Patient understands diagnosis:  Yes  As evidenced by seeking help with depression, anxiety and SI Discussing patient identified problems/goals with staff:  Yes, see initial care plan. Medical problems stabilized or resolved:  Yes. Denies suicidal/homicidal ideation: Yes. Issues/concerns per patient self-inventory:  No. Other:  New problem(s) identified:  Discharge Plan or Barriers: Pt already asking for d/c as he has a meeting with PO tomorrow  Reason for Continuation of Hospitalization:   Comments:  35 year old male who presents, in no acute distress, for the treatment of SI, Depression, and Substance Abuse. Patient appears flat, sad, and depressed. Patient was calm and cooperative with admission process. Patient reports that he tried to kill himself "off of Ativan and alcohol". Patient is unable to recall how many Ativan he took. Patient states that he does not remember what happened after taking the Ativan; "It's all just black to me". Patient reports "I have no motivation to live". Patient currently presents with passive SI and contracts for safety upon admission stating "I'm going to keep myself safe while I'm here". Will start CIWA and Ativan Detox protocol.  Will continue Abilify 10 mg po bid for psychosis/mood sx.. Will continue Cymbalta 3010 mg po daily for depression.  Estimated length of stay: D/C today  New goal(s):  Review of initial/current patient goals per problem list:   Review of initial/current patient goals per problem list:  1. Goal(s): Patient will participate in aftercare plan   Met: Yes   Target date: 3-5 days post admission date   As evidenced by: Patient will  participate within aftercare plan AEB aftercare provider and housing plan at discharge being identified. 12/01/15:  Return home, follow up outpt   2. Goal (s): Patient will exhibit decreased depressive symptoms and suicidal ideations.   Met:Yes   Target date: 3-5 days post admission date   As evidenced by: Patient will utilize self rating of depression at 3 or below and demonstrate decreased signs of depression or be deemed stable for discharge by MD. 12/01/15:  Rates his depression a 5 today. 5/17/17[name] denies depression today     3. Goal(s): Patient will demonstrate decreased signs and symptoms of anxiety.   Met: Yes   Target date: 3-5 days post admission date   As evidenced by: Patient will utilize self rating of anxiety at 3 or below and demonstrated decreased signs of anxiety, or be deemed stable for discharge by MD 12/01/15:  Rates his anxiety a 6 today. 5/17/17[name] denies anxiety today     4. Goal(s): Patient will demonstrate decreased signs of withdrawal due to substance abuse   Met: Yes   Target date: 3-5 days post admission date   As evidenced by: Patient will produce a CIWA/COWS score of 0, have stable vitals signs, and no symptoms of withdrawal 12/01/15:  CIWA score of 3 today.  On Ativan detox protocol.  Getting ativan tid today. 12/03/15:  No signs nor symptoms of withdrawal today     5. Goal(s): Patient will demonstrate decreased signs of psychosis  * Met: Yes  * Target date: 3-5 days post admission date  * As evidenced by: Patient will demonstrate decreased frequency of AVH or return to baseline function 12/01/15:  Denies AH,  VH today          Attendees: Patient:  12/03/2015 11:08 AM   Family:   12/03/2015 11:08 AM   Physician:  Ursula Alert, MD 12/03/2015 11:08 AM   Nursing:   Eulogio Bear, RN 12/03/2015 11:08 AM   CSW:    Roque Lias, LCSW   12/03/2015 11:08 AM   Other:  12/03/2015 11:08 AM   Other:   12/03/2015 11:08 AM    Other:  Lars Pinks, Nurse CM 12/03/2015 11:08 AM   Other:   12/03/2015 11:08 AM   Other:  Norberto Sorenson, [place]  12/03/2015 11:08 AM   Other:  12/03/2015 11:08 AM   Other:  12/03/2015 11:08 AM   Other:  12/03/2015 11:08 AM   Other:  12/03/2015 11:08 AM   Other:  12/03/2015 11:08 AM   Other:   12/03/2015 11:08 AM    Scribe for Treatment Team:   [name], 12/03/2015 11:08 AM

## 2015-12-29 ENCOUNTER — Emergency Department (HOSPITAL_COMMUNITY)
Admission: EM | Admit: 2015-12-29 | Discharge: 2015-12-30 | Disposition: A | Discharge status: Other Acute Inpatient Hospital | Payer: No Typology Code available for payment source | Attending: Emergency Medicine | Admitting: Emergency Medicine

## 2015-12-29 ENCOUNTER — Encounter (HOSPITAL_COMMUNITY): Payer: Self-pay | Admitting: Emergency Medicine

## 2015-12-29 DIAGNOSIS — Z8669 Personal history of other diseases of the nervous system and sense organs: Secondary | ICD-10-CM | POA: Insufficient documentation

## 2015-12-29 DIAGNOSIS — F1410 Cocaine abuse, uncomplicated: Secondary | ICD-10-CM

## 2015-12-29 DIAGNOSIS — I10 Essential (primary) hypertension: Secondary | ICD-10-CM | POA: Insufficient documentation

## 2015-12-29 DIAGNOSIS — I739 Peripheral vascular disease, unspecified: Secondary | ICD-10-CM | POA: Insufficient documentation

## 2015-12-29 DIAGNOSIS — T50904A Poisoning by unspecified drugs, medicaments and biological substances, undetermined, initial encounter: Secondary | ICD-10-CM

## 2015-12-29 DIAGNOSIS — F1024 Alcohol dependence with alcohol-induced mood disorder: Secondary | ICD-10-CM | POA: Diagnosis present

## 2015-12-29 DIAGNOSIS — F4310 Post-traumatic stress disorder, unspecified: Secondary | ICD-10-CM | POA: Diagnosis present

## 2015-12-29 DIAGNOSIS — Z853 Personal history of malignant neoplasm of breast: Secondary | ICD-10-CM | POA: Insufficient documentation

## 2015-12-29 DIAGNOSIS — R45851 Suicidal ideations: Secondary | ICD-10-CM

## 2015-12-29 DIAGNOSIS — F14288 Cocaine dependence with other cocaine-induced disorder: Secondary | ICD-10-CM | POA: Diagnosis present

## 2015-12-29 DIAGNOSIS — F333 Major depressive disorder, recurrent, severe with psychotic symptoms: Secondary | ICD-10-CM

## 2015-12-29 DIAGNOSIS — T424X1A Poisoning by benzodiazepines, accidental (unintentional), initial encounter: Secondary | ICD-10-CM | POA: Insufficient documentation

## 2015-12-29 DIAGNOSIS — Z85528 Personal history of other malignant neoplasm of kidney: Secondary | ICD-10-CM | POA: Insufficient documentation

## 2015-12-29 DIAGNOSIS — F1020 Alcohol dependence, uncomplicated: Secondary | ICD-10-CM

## 2015-12-29 DIAGNOSIS — I2510 Atherosclerotic heart disease of native coronary artery without angina pectoris: Secondary | ICD-10-CM | POA: Insufficient documentation

## 2015-12-29 DIAGNOSIS — F17200 Nicotine dependence, unspecified, uncomplicated: Secondary | ICD-10-CM | POA: Insufficient documentation

## 2015-12-29 LAB — CBC
HEMATOCRIT: 34.8 % — AB (ref 39.0–52.0)
HEMOGLOBIN: 11.9 g/dL — AB (ref 13.0–17.0)
MCH: 30.4 pg (ref 26.0–34.0)
MCHC: 34.2 g/dL (ref 30.0–36.0)
MCV: 88.8 fL (ref 78.0–100.0)
Platelets: 156 10*3/uL (ref 150–400)
RBC: 3.92 MIL/uL — ABNORMAL LOW (ref 4.22–5.81)
RDW: 13.9 % (ref 11.5–15.5)
WBC: 4.1 10*3/uL (ref 4.0–10.5)

## 2015-12-29 LAB — COMPREHENSIVE METABOLIC PANEL
ALBUMIN: 2.9 g/dL — AB (ref 3.5–5.0)
ALK PHOS: 32 U/L — AB (ref 38–126)
ALT: 15 U/L — ABNORMAL LOW (ref 17–63)
ANION GAP: 6 (ref 5–15)
AST: 21 U/L (ref 15–41)
BILIRUBIN TOTAL: 0.3 mg/dL (ref 0.3–1.2)
BUN: 8 mg/dL (ref 6–20)
CO2: 22 mmol/L (ref 22–32)
Calcium: 7.7 mg/dL — ABNORMAL LOW (ref 8.9–10.3)
Chloride: 112 mmol/L — ABNORMAL HIGH (ref 101–111)
Creatinine, Ser: 0.96 mg/dL (ref 0.61–1.24)
GFR calc non Af Amer: >60 mL/min (ref >60)
GLUCOSE: 110 mg/dL — AB (ref 65–99)
POTASSIUM: 3.1 mmol/L — AB (ref 3.5–5.1)
SODIUM: 140 mmol/L (ref 135–145)
Total Protein: 5.2 g/dL — ABNORMAL LOW (ref 6.5–8.1)

## 2015-12-29 LAB — ETHANOL: ALCOHOL ETHYL (B): 129 mg/dL — AB (ref <5)

## 2015-12-29 LAB — ACETAMINOPHEN LEVEL: Acetaminophen (Tylenol), Serum: <10 ug/mL — ABNORMAL LOW (ref 10–30)

## 2015-12-29 LAB — RAPID URINE DRUG SCREEN, HOSP PERFORMED
AMPHETAMINES: NOT DETECTED
BARBITURATES: NOT DETECTED
BENZODIAZEPINES: POSITIVE — AB
COCAINE: POSITIVE — AB
Opiates: NOT DETECTED
TETRAHYDROCANNABINOL: NOT DETECTED

## 2015-12-29 LAB — SALICYLATE LEVEL: Salicylate Lvl: <4 mg/dL (ref 2.8–30.0)

## 2015-12-29 MED ORDER — ALUM & MAG HYDROXIDE-SIMETH 200-200-20 MG/5ML PO SUSP: 30.0000 mL | ORAL | Status: DC | PRN

## 2015-12-29 MED ORDER — NICOTINE 21 MG/24HR TD PT24: 21.0000 mg | MEDICATED_PATCH | Freq: Every day | TRANSDERMAL | Status: DC

## 2015-12-29 MED ORDER — ZOLPIDEM TARTRATE 5 MG PO TABS: 5.0000 mg | ORAL_TABLET | Freq: Every evening | ORAL | Status: DC | PRN

## 2015-12-29 MED ORDER — ONDANSETRON HCL 4 MG PO TABS: 4.0000 mg | ORAL_TABLET | Freq: Three times a day (TID) | ORAL | Status: DC | PRN

## 2015-12-29 MED ORDER — SODIUM CHLORIDE 0.9 % IV SOLN
Freq: Once | INTRAVENOUS | Status: AC
  Administered 2015-12-29: 18:00:00 via INTRAVENOUS

## 2015-12-29 MED ORDER — IBUPROFEN 200 MG PO TABS: 600.0000 mg | ORAL_TABLET | Freq: Three times a day (TID) | ORAL | Status: DC | PRN

## 2015-12-29 NOTE — ED Provider Notes (Signed)
CSN: [id number]     Arrival date & time 12/29/15  1731 History   First MD Initiated Contact with Patient 12/29/15 1746     Chief Complaint  Patient presents with  . Drug Overdose  . Suicidal      HPI Per EMS patient took 14-15 klonopin around 1600 as well as 1/2 gallon of alcohol (unknown). Patient reports he was trying to commit suicide but after 4-5 minutes called EMS. EMS gave 4mg  IV zofran. Patient drowsy on arrival to ER. Per EMS pt became hypotensive with BP in 90s in route to ER. Pt given 264mL NS Past Medical History  Diagnosis Date  . Seizures ([place])   . Hypertension   . Depression   . Pancreatitis   . Liver cirrhosis ([place])   . Coronary artery disease   . Angina   . Shortness of breath   . Headache(784.0)   . Peripheral vascular disease [facility]) April 2011    Left Pop  . Hypercholesteremia   . Schizophrenia ([place])   . Bipolar 1 disorder ([place])   . Cancer of kidney ([place]) dx'd 04/2013    lt nephrectomy  . Breast CA ([place]) dx'd 2009    bil w/ bil masectomy and oral meds  . H/O suicide attempt 2015    overdose   Past Surgical History  Procedure Laterality Date  . Chest surgery    . Left leg surgery    . Mastectomy    . Left kidney removal    . Breast surgery     Family History  Problem Relation Age of Onset  . Stroke Other   . Cancer Other   . Hyperlipidemia Mother   . Hypertension Mother    Social History  Substance Use Topics  . Smoking status: Current Every Day Smoker  . Smokeless tobacco: None  . Alcohol Use: 0.0 oz/week    Review of Systems  Unable to perform ROS: Mental status change      Allergies  Codeine; Penicillins; Depakote; Morphine; Coconut flavor; Depakote er; Oxycodone; and Hydrocodone-acetaminophen  Home Medications   Prior to Admission medications   Medication Sig Start Date End Date Taking? Authorizing Provider  amLODipine (NORVASC) 5 MG tablet Take 1 tablet (5 mg total) by mouth daily. 12/02/15   Kerrie [name], NP   ARIPiprazole (ABILIFY) 10 MG tablet Take 1 tablet (10 mg total) by mouth daily. 12/02/15   Kerrie [name], NP  atorvastatin (LIPITOR) 20 MG tablet Take 1 tablet (20 mg total) by mouth every morning. 12/02/15   Kerrie [name], NP  DULoxetine (CYMBALTA) 30 MG capsule Take 1 capsule (30 mg total) by mouth daily. 12/02/15   Kerrie [name], NP  hydrOXYzine (ATARAX/VISTARIL) 25 MG tablet Take 1 tablet (25 mg total) by mouth every 6 (six) hours as needed for anxiety. 12/02/15   Kerrie [name], NP  metoprolol tartrate (LOPRESSOR) 25 MG tablet Take 1 tablet (25 mg total) by mouth 2 (two) times daily. 12/02/15   Kerrie [name], NP  traZODone (DESYREL) 100 MG tablet Take 1 tablet (100 mg total) by mouth at bedtime. 12/02/15   Kerrie [name], NP   BP 100/68 mmHg  Pulse 85  Temp(Src) 97.9 F (36.6 C) (Oral)  Resp 15  SpO2 96% Physical Exam  Constitutional: He appears well-developed and well-nourished. He appears lethargic. No distress.  HENT:  Head: Normocephalic and atraumatic.  Eyes: Pupils are equal, round, and reactive to light.  Neck: Normal range of motion.  Cardiovascular: Normal rate and intact distal pulses.  Pulmonary/Chest: Effort normal. No respiratory distress.  Abdominal: Soft. Normal appearance. He exhibits no distension. There is no tenderness. There is no rebound.  Musculoskeletal: Normal range of motion.  Neurological: He appears lethargic. No cranial nerve deficit. GCS eye subscore is 4. GCS verbal subscore is 4. GCS motor subscore is 6.  Skin: Skin is warm and dry. No rash noted.  Psychiatric: He has a normal mood and affect. His behavior is normal.  Nursing note and vitals reviewed.   ED Course  Procedures (including critical care time) Labs Review Labs Reviewed  COMPREHENSIVE METABOLIC PANEL - Abnormal; Notable for the following:    Potassium 3.1 (*)    Chloride 112 (*)    Glucose, Bld 110 (*)    Calcium 7.7 (*)    Total Protein 5.2 (*)    Albumin 2.9 (*)    ALT 15 (*)     Alkaline Phosphatase 32 (*)    All other components within normal limits  ETHANOL - Abnormal; Notable for the following:    Alcohol, Ethyl (B) 129 (*)    All other components within normal limits  CBC - Abnormal; Notable for the following:    RBC 3.92 (*)    Hemoglobin 11.9 (*)    HCT 34.8 (*)    All other components within normal limits  URINE RAPID DRUG SCREEN, HOSP PERFORMED - Abnormal; Notable for the following:    Cocaine POSITIVE (*)    Benzodiazepines POSITIVE (*)    All other components within normal limits  ACETAMINOPHEN LEVEL - Abnormal; Notable for the following:    Acetaminophen (Tylenol), Serum <10 (*)    All other components within normal limits  SALICYLATE LEVEL    Imaging Review No results found. I have personally reviewed and evaluated these images and lab results as part of my medical decision-making.   EKG Interpretation   Date/Time:  Monday December 29 2015 17:57:08 EDT Ventricular Rate:  96 PR Interval:  129 QRS Duration: 85 QT Interval:  391 QTC Calculation: 494 R Axis:   70 Text Interpretation:  Sinus rhythm Borderline prolonged QT interval  Otherwise no significant change Confirmed by Azrielle Springsteen  MD, Verina Galeno ([id number]) on  12/29/2015 6:31:36 PM     Patient much improved and is much more awake alert.  Able to drink diet Coke with no difficulty. MDM   Final diagnoses:  Drug overdose, undetermined intent, initial encounter  Suicidal ideations        Leonard Schwartz, MD 12/29/15 [phone]

## 2015-12-29 NOTE — ED Notes (Signed)
Lori from [employer] called: information given about initial ECG given QT=391, QTc=494, QRS=85, sinus rhythm at 96, borderline prolonged QTc; recommendation from Poison Control give potassium, check magnesium, check another ECG to ensure no further lengthening of QTc present.

## 2015-12-29 NOTE — ED Notes (Addendum)
Per EMS patient took 14-15 klonopin around 1600 as well as 1/2 gallon of alcohol (unknown).  Patient reports he was trying to commit suicide but after 4-5 minutes called EMS.  EMS gave 4mg  IV zofran.  Patient drowsy on arrival to ER.  Per EMS pt became hypotensive with BP in 90s in route to ER.  Pt given 27mL NS.

## 2015-12-29 NOTE — ED Notes (Signed)
Gave pt Diet Coke

## 2015-12-29 NOTE — ED Notes (Signed)
Pt is unable to urinate at this time but is aware that he needs a urine sample

## 2015-12-30 ENCOUNTER — Encounter (HOSPITAL_COMMUNITY): Payer: Self-pay | Admitting: *Deleted

## 2015-12-30 ENCOUNTER — Inpatient Hospital Stay (HOSPITAL_COMMUNITY)
Admission: AD | Admit: 2015-12-30 | Discharge: 2016-01-05 | DRG: 885 | Disposition: A | Discharge status: Home / Self Care | Payer: No Typology Code available for payment source | Source: Intra-hospital | Attending: Psychiatry | Admitting: Psychiatry

## 2015-12-30 DIAGNOSIS — F332 Major depressive disorder, recurrent severe without psychotic features: Principal | ICD-10-CM | POA: Diagnosis present

## 2015-12-30 DIAGNOSIS — Z905 Acquired absence of kidney: Secondary | ICD-10-CM | POA: Diagnosis not present

## 2015-12-30 DIAGNOSIS — I2510 Atherosclerotic heart disease of native coronary artery without angina pectoris: Secondary | ICD-10-CM | POA: Diagnosis present

## 2015-12-30 DIAGNOSIS — F1010 Alcohol abuse, uncomplicated: Secondary | ICD-10-CM | POA: Diagnosis present

## 2015-12-30 DIAGNOSIS — Z85528 Personal history of other malignant neoplasm of kidney: Secondary | ICD-10-CM

## 2015-12-30 DIAGNOSIS — Z853 Personal history of malignant neoplasm of breast: Secondary | ICD-10-CM | POA: Diagnosis not present

## 2015-12-30 DIAGNOSIS — F411 Generalized anxiety disorder: Secondary | ICD-10-CM | POA: Diagnosis present

## 2015-12-30 DIAGNOSIS — I10 Essential (primary) hypertension: Secondary | ICD-10-CM | POA: Insufficient documentation

## 2015-12-30 DIAGNOSIS — Z590 Homelessness: Secondary | ICD-10-CM

## 2015-12-30 DIAGNOSIS — R45851 Suicidal ideations: Secondary | ICD-10-CM

## 2015-12-30 DIAGNOSIS — F209 Schizophrenia, unspecified: Secondary | ICD-10-CM | POA: Diagnosis present

## 2015-12-30 DIAGNOSIS — Z8249 Family history of ischemic heart disease and other diseases of the circulatory system: Secondary | ICD-10-CM

## 2015-12-30 DIAGNOSIS — F1910 Other psychoactive substance abuse, uncomplicated: Secondary | ICD-10-CM | POA: Diagnosis present

## 2015-12-30 DIAGNOSIS — G4700 Insomnia, unspecified: Secondary | ICD-10-CM | POA: Diagnosis not present

## 2015-12-30 DIAGNOSIS — E7800 Pure hypercholesterolemia, unspecified: Secondary | ICD-10-CM | POA: Diagnosis present

## 2015-12-30 DIAGNOSIS — Z9013 Acquired absence of bilateral breasts and nipples: Secondary | ICD-10-CM

## 2015-12-30 DIAGNOSIS — F1424 Cocaine dependence with cocaine-induced mood disorder: Secondary | ICD-10-CM | POA: Diagnosis not present

## 2015-12-30 DIAGNOSIS — F1410 Cocaine abuse, uncomplicated: Secondary | ICD-10-CM | POA: Diagnosis not present

## 2015-12-30 DIAGNOSIS — F17200 Nicotine dependence, unspecified, uncomplicated: Secondary | ICD-10-CM | POA: Diagnosis present

## 2015-12-30 DIAGNOSIS — F1020 Alcohol dependence, uncomplicated: Secondary | ICD-10-CM | POA: Diagnosis not present

## 2015-12-30 DIAGNOSIS — Z915 Personal history of self-harm: Secondary | ICD-10-CM

## 2015-12-30 DIAGNOSIS — F1420 Cocaine dependence, uncomplicated: Secondary | ICD-10-CM | POA: Diagnosis not present

## 2015-12-30 DIAGNOSIS — Z9114 Patient's other noncompliance with medication regimen: Secondary | ICD-10-CM | POA: Diagnosis not present

## 2015-12-30 DIAGNOSIS — K7460 Unspecified cirrhosis of liver: Secondary | ICD-10-CM | POA: Diagnosis present

## 2015-12-30 DIAGNOSIS — F4310 Post-traumatic stress disorder, unspecified: Secondary | ICD-10-CM

## 2015-12-30 DIAGNOSIS — F333 Major depressive disorder, recurrent, severe with psychotic symptoms: Secondary | ICD-10-CM | POA: Diagnosis not present

## 2015-12-30 DIAGNOSIS — Z823 Family history of stroke: Secondary | ICD-10-CM

## 2015-12-30 DIAGNOSIS — I739 Peripheral vascular disease, unspecified: Secondary | ICD-10-CM | POA: Diagnosis present

## 2015-12-30 MED ORDER — HYDROXYZINE HCL 25 MG PO TABS: 25.0000 mg | ORAL_TABLET | Freq: Four times a day (QID) | ORAL | Status: DC | PRN

## 2015-12-30 MED ORDER — METOPROLOL TARTRATE 25 MG PO TABS
25.0000 mg | ORAL_TABLET | Freq: Two times a day (BID) | ORAL | Status: DC
  Administered 2015-12-30: 25 mg via ORAL
  Filled 2015-12-30: qty 1

## 2015-12-30 MED ORDER — AMLODIPINE BESYLATE 5 MG PO TABS
5.0000 mg | ORAL_TABLET | Freq: Every day | ORAL | Status: DC
  Administered 2015-12-30: 5 mg via ORAL
  Filled 2015-12-30: qty 1

## 2015-12-30 MED ORDER — ONDANSETRON HCL 4 MG PO TABS
4.0000 mg | ORAL_TABLET | Freq: Three times a day (TID) | ORAL | Status: DC | PRN
  Administered 2016-01-03: 4 mg via ORAL
  Filled 2015-12-30: qty 1

## 2015-12-30 MED ORDER — TRAZODONE HCL 150 MG PO TABS
300.0000 mg | ORAL_TABLET | Freq: Every day | ORAL | Status: DC
  Administered 2015-12-30 – 2015-12-31 (×2): 300 mg via ORAL
  Filled 2015-12-30 (×5): qty 2

## 2015-12-30 MED ORDER — LORAZEPAM 1 MG PO TABS: 1.0000 mg | ORAL_TABLET | Freq: Four times a day (QID) | ORAL | Status: DC | PRN

## 2015-12-30 MED ORDER — IBUPROFEN 600 MG PO TABS
600.0000 mg | ORAL_TABLET | Freq: Three times a day (TID) | ORAL | Status: DC | PRN
  Administered 2015-12-30 – 2015-12-31 (×2): 600 mg via ORAL
  Filled 2015-12-30 (×2): qty 1

## 2015-12-30 MED ORDER — DULOXETINE HCL 30 MG PO CPEP
30.0000 mg | ORAL_CAPSULE | Freq: Every day | ORAL | Status: DC
  Administered 2015-12-30: 30 mg via ORAL
  Filled 2015-12-30: qty 1

## 2015-12-30 MED ORDER — LORAZEPAM 1 MG PO TABS
1.0000 mg | ORAL_TABLET | Freq: Four times a day (QID) | ORAL | Status: DC | PRN
  Administered 2015-12-30 – 2016-01-03 (×3): 1 mg via ORAL
  Filled 2015-12-30 (×3): qty 1

## 2015-12-30 MED ORDER — ALUM & MAG HYDROXIDE-SIMETH 200-200-20 MG/5ML PO SUSP: 30.0000 mL | ORAL | Status: DC | PRN

## 2015-12-30 MED ORDER — HYDROXYZINE HCL 25 MG PO TABS
25.0000 mg | ORAL_TABLET | Freq: Four times a day (QID) | ORAL | Status: DC | PRN
  Administered 2016-01-03 – 2016-01-04 (×3): 25 mg via ORAL
  Filled 2015-12-30 (×2): qty 1
  Filled 2015-12-30: qty 20
  Filled 2015-12-30: qty 1

## 2015-12-30 MED ORDER — POTASSIUM CHLORIDE CRYS ER 20 MEQ PO TBCR
40.0000 meq | EXTENDED_RELEASE_TABLET | Freq: Once | ORAL | Status: AC
Start: 2015-12-30 — End: 2015-12-30
  Administered 2015-12-30: 40 meq via ORAL
  Filled 2015-12-30 (×2): qty 2

## 2015-12-30 MED ORDER — AMLODIPINE BESYLATE 5 MG PO TABS
5.0000 mg | ORAL_TABLET | Freq: Every day | ORAL | Status: DC
  Administered 2015-12-31: 5 mg via ORAL
  Filled 2015-12-30 (×5): qty 1

## 2015-12-30 MED ORDER — ATORVASTATIN CALCIUM 20 MG PO TABS
20.0000 mg | ORAL_TABLET | Freq: Every day | ORAL | Status: DC
  Administered 2015-12-30: 20 mg via ORAL
  Filled 2015-12-30: qty 1

## 2015-12-30 MED ORDER — MAGNESIUM HYDROXIDE 400 MG/5ML PO SUSP: 30.0000 mL | Freq: Every day | ORAL | Status: DC | PRN

## 2015-12-30 MED ORDER — ATORVASTATIN CALCIUM 40 MG PO TABS
20.0000 mg | ORAL_TABLET | Freq: Every day | ORAL | Status: DC
  Administered 2015-12-31 – 2016-01-05 (×6): 20 mg via ORAL
  Filled 2015-12-30 (×5): qty 1
  Filled 2015-12-30: qty 7
  Filled 2015-12-30 (×2): qty 1
  Filled 2015-12-30: qty 2

## 2015-12-30 MED ORDER — TRAZODONE HCL 100 MG PO TABS: 100.0000 mg | ORAL_TABLET | Freq: Every day | ORAL | Status: DC

## 2015-12-30 MED ORDER — METOPROLOL TARTRATE 25 MG PO TABS
25.0000 mg | ORAL_TABLET | Freq: Two times a day (BID) | ORAL | Status: DC
  Administered 2015-12-30 – 2015-12-31 (×3): 25 mg via ORAL
  Filled 2015-12-30 (×9): qty 1

## 2015-12-30 MED ORDER — ARIPIPRAZOLE 10 MG PO TABS
10.0000 mg | ORAL_TABLET | Freq: Every day | ORAL | Status: DC
  Administered 2015-12-30: 10 mg via ORAL
  Filled 2015-12-30: qty 1

## 2015-12-30 NOTE — BHH Group Notes (Signed)
Adult Psychoeducational Group Note  Date:  12/30/2015 Time:  10:22 PM  Group Topic/Focus:  AA Meeting  Participation Level:  Did Not Attend  Participation Quality:  None  Affect:  None  Cognitive:  None  Insight: None  Engagement in Group:  None  Modes of Intervention:  Discussion and Education  Additional Comments:  Pt did not attend AA meeting.  Victorino Sparrow A 12/30/2015, 10:22 PM

## 2015-12-30 NOTE — Progress Notes (Addendum)
Pt with [facility] 7 ED visits and 4 admissions in the last 6 months  Pt listed with [facility] pcp services  No ED CP  Not eligible for [facility]  Last [facility] admission Date of Admission: 11/28/2015 Date of Discharge: 12/02/2015 Reason for Admission: Suicide attempt by alcohol and overdose Ativan Principal Problem: Alcohol use disorder, severe, dependence ([place])  [facility] staff confirms pt [employer] card expired in February 2017 Pt is non compliant to f/u with renewal after attempts of the [facility] CM  To reach him Pt noncompliant with f/u to renew [facility] services CM spoke with pt to encourage renewal of services and pcp Pt states he gets most of his medications from [place] and that is his preference

## 2015-12-30 NOTE — ED Notes (Signed)
Patient transferred over to [facility] Unit.  He left the unit ambulatory with [employer].  All belongings given to the driver.
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
## 2015-12-30 NOTE — ED Notes (Signed)
Patient report having suicidal thoughts prior to arrival. Patient also reports intentional suicidal attempt by trying to over dose on his antianxiety medication and alcohol. Patient denies HI and AVH at this time. Plan of care discussed. Patient voices no complaints or concerns at this time. Encouragement and support provided and safety maintain. Q 15 min safety checks in place.
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
## 2015-12-30 NOTE — Progress Notes (Signed)
Patient ID: [name], male   DOB: July 05, 1981, 35 y.o.   MRN: [id number]  Writer assumed care of this patient. Patient reports a headache and feeling suicidal. He received PRN Ibuprofen for a headache. He is able to contract for safety at this time. Q15 minute safety checks are maintained at this time.
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
## 2015-12-30 NOTE — ED Notes (Signed)
Bed: [facility] Expected date:  Expected time:  Means of arrival:  Comments: Hold for rm 3

## 2015-12-30 NOTE — ED Notes (Signed)
Pattie from poison control closed patient case.

## 2015-12-30 NOTE — BH Assessment (Signed)
[facility] Assessment Progress Note  Per Corena Pilgrim, MD, this pt requires psychiatric hospitalization at this time.  Letitia Libra, RN, [facility] has assigned pt to [facility] Rm [id number].  Pt has signed Voluntary Admission and Consent for Treatment, as well as Consent to Release Information to [facility], his outpatient provider and to his parole officer, and a notification call has been placed to the former.  Signed forms have been faxed to [facility].  Pt's nurse, Gerrit Friends, has been notified, and agrees to send original paperwork along with pt via [facility], and to call report to [phone].  Jalene Mullet, [facility] Triage Specialist [phone]
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
## 2015-12-31 MED ORDER — METHOCARBAMOL 750 MG PO TABS
ORAL_TABLET | ORAL | Status: AC
  Administered 2015-12-31: 750 mg via ORAL
  Filled 2015-12-31: qty 1

## 2015-12-31 MED ORDER — METHOCARBAMOL 750 MG PO TABS
750.0000 mg | ORAL_TABLET | Freq: Once | ORAL | Status: AC
  Administered 2015-12-31: 750 mg via ORAL
  Filled 2015-12-31: qty 1
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
## 2015-12-31 NOTE — Progress Notes (Signed)
D. Pt had been up and visible in milieu this evening, minimal interaction with peers and did not attend evening group activity. Pt spoke about the need to go to a long term treatment facility and spoke about how he has never tried this before but believes it to be in his best interest as he keeps falling back into old habits and ends up back in the hospital. Pt did endorse on-going anxiety and was able to receive bedtime medications without incident this evening. A. Support and encouragement provided. R. Safety maintained, will continue to monitor.

## 2015-12-31 NOTE — [facility] Group Notes (Signed)
[place] LCSW Group Therapy 12/31/2015  1:15 PM   Type of Therapy: Group Therapy  Participation Level: Did Not Attend. Patient invited to participate but declined.   Tilden Fossa, MSW, [place] Clinical Social Worker [facility] [phone]

## 2015-12-31 NOTE — Progress Notes (Signed)
Patient ID: [name], male   DOB: 22-Apr-1981, 35 y.o.   MRN: [id number]  Pt currently presents with an anxious affect and pleasant behavior. Per self inventory, pt rates depression, hopelessness and anxiety at a 9. Pt's daily goal is to "get on my pain medication" and they intend to do so by "talk to doctor." Pt reports poor sleep, a fair appetite, low energy and poor concentration. Pt reports withdrawal symptoms including generalized pain, fatigue, achy joints and sleepiness. Pt has edema around nose and lower jaw.   Pt provided with medications per providers orders. Pt's labs and vitals were monitored throughout the day. Pt supported emotionally and encouraged to express concerns and questions. Pt educated on medications and withdrawal side effects. MD notified of swelling.   Pt's safety ensured with 15 minute and environmental checks. Pt currently denies SI/HI and A/V hallucinations. Pt verbally agrees to seek staff if SI/HI or A/VH occurs and to consult with staff before acting on any harmful thoughts. Will continue POC.

## 2015-12-31 NOTE — H&P (Signed)
Psychiatric Admission Assessment Adult  [name] MRN:  [id number] Date of Evaluation:  12/31/2015 Chief Complaint:  MDD RECURRENT EPISODE SEVERE WITH OUT PSYCHOTIC BEHAVIORALS Principal Diagnosis: Depression, major, severe recurrence ([place]) Diagnosis:   Patient Active Problem List   Diagnosis Date Noted  . Major depressive disorder, recurrent episode, severe, with psychotic behavior ([place]) [F33.3] 12/30/2015  . Depression, major, severe recurrence ([place]) [F33.2] 12/30/2015  . Substance induced mood disorder ([place]) [F19.94] 12/02/2015  . Mood disorder in conditions classified elsewhere [F06.30]   . MDD (major depressive disorder), recurrent severe, without psychosis ([place]) [F33.2] 10/08/2015  . Malnutrition of moderate degree [E44.0] 09/24/2015  . Alcohol withdrawal ([place]) [F10.239] 09/23/2015  . Tobacco use disorder [F17.200] 07/16/2015  . Elevated transaminase level [R74.0]   . Drug overdose, intentional ([place]) [T50.902A] 07/12/2015  . Alcohol use disorder, severe, dependence ([place]) [F10.20] 04/15/2015  . Cocaine abuse with cocaine-induced mood disorder ([place]) [F14.14] 04/11/2015  . Overdose [T50.901A] 04/10/2015  . Severe recurrent major depressive disorder with psychotic features ([name]) [F33.3]   . Severe major depression with psychotic features ([place]) [F32.3] 09/11/2014  . Alcohol-induced mood disorder ([place]) [F10.94] 09/10/2014  . Suicidal ideation [R45.851]   . Tylenol overdose [T39.1X4A]   . Polysubstance abuse [F19.10]   . Overdose of acetaminophen [T39.1X4A] 08/03/2014  . Overdose by acetaminophen [T39.1X4A] 08/03/2014  . Cocaine abuse [F14.10] 04/16/2014  . Thrombocytopenia ([place]) [D69.6] 04/15/2014  . Urinary tract infection, site not specified [N39.0] 04/15/2014  . Chest pain, unspecified [R07.9] 04/15/2014  . D-dimer, elevated [R79.1] 04/15/2014  . Transaminitis [R74.0] 09/24/2013  . Cocaine dependence ([place]) [F14.20] 09/20/2013  . S/p nephrectomy  [Z90.5] 04/28/2013  . Seizure ([place]) [R56.9] 03/15/2013  . Syncope [R55] 01/02/2013  . Leukocytopenia, unspecified [D72.819] 01/02/2013  . Left kidney mass [N28.89] 12/24/2012  . PTSD (post-traumatic stress disorder) [F43.10] 07/06/2012  . Peripheral vascular disease ([place]) [I73.9] 01/14/2012  . SEIZURE DISORDER [R56.9] 10/03/2008  . LIVER FUNCTION TESTS, ABNORMAL [R94.5] 12/29/2007  . HYPERCHOLESTEROLEMIA [E78.00] 03/21/2007  . Essential hypertension [I10] 03/21/2007   History of Present Illness: [name] is a 35 years old male admitted from best the [facility] for increased symptoms of depression, anxiety, relapse and drug of abuse, noncompliant with medication and status post intentional drug overdose as a suicide attempt. Patient stated that he has been suffering with multiple psychosocial stresses like financial difficulties, transportation difficulties, no job, and being a homelessness and has been rated his depression as 10 out of 10 and also anxiety 10 out of 10. Patient has craving for alcohol and cocaine and also hand shakes grinding his teeth and ongoing suicidal ideations as he has hate himself and triggers for his lack of coping skills to deal with his depression anxiety and substance abuse. Patient requested he may be better off receiving inpatient substance abuse counseling then outpatient at [facility]. Patient reportedly drinking a half gallon of vodka daily, $20 of crack cocaine. Patient reportedly made at least partly suicidal attempts with the intentional overdose and recent one was about 3-4 weeks ago required hospitalization on 12/12/2015. Patient claims his medications are not working which is due to be changed. Patient cannot contract for safety and needed frequent suicidal thoughts. Patient urine drug screen is positive for cocaine and benzodiazepine. Patient blood alcohol level is 129 on arrival.  Associated Signs/Symptoms: Depression  Symptoms:  depressed mood, anhedonia, insomnia, psychomotor retardation, fatigue, feelings of worthlessness/guilt, difficulty concentrating, hopelessness, suicidal attempt, anxiety, loss of energy/fatigue, weight loss, decreased labido,  decreased appetite, (Hypo) Manic Symptoms:  Distractibility, Impulsivity, Irritable Mood, Anxiety Symptoms:  Excessive Worry, Psychotic Symptoms:  denied PTSD Symptoms: NA Total Time spent with patient: 1 hour  Past Psychiatric History: Patient has history of multiple acute psychiatric hospitalization and at least 7 suicidal attempts during the last 12 months.  Is the patient at risk to self? Yes.    Has the patient been a risk to self in the past 6 months? Yes.    Has the patient been a risk to self within the distant past? Yes.    Is the patient a risk to others? No.  Has the patient been a risk to others in the past 6 months? No.  Has the patient been a risk to others within the distant past? No.   Prior Inpatient Therapy:   Prior Outpatient Therapy:    Alcohol Screening: 1. How often do you have a drink containing alcohol?: 4 or more times a week 2. How many drinks containing alcohol do you have on a typical day when you are drinking?: 10 or more 3. How often do you have six or more drinks on one occasion?: Daily or almost daily Preliminary Score: 8 4. How often during the last year have you found that you were not able to stop drinking once you had started?: Daily or almost daily 5. How often during the last year have you failed to do what was normally expected from you becasue of drinking?: Daily or almost daily 6. How often during the last year have you needed a first drink in the morning to get yourself going after a heavy drinking session?: Daily or almost daily 7. How often during the last year have you had a feeling of guilt of remorse after drinking?: Daily or almost daily 8. How often during the last year have you been unable to  remember what happened the night before because you had been drinking?: Daily or almost daily 9. Have you or someone else been injured as a result of your drinking?: Yes, during the last year 10. Has a relative or friend or a doctor or another health worker been concerned about your drinking or suggested you cut down?: Yes, during the last year Alcohol Use Disorder Identification Test Final Score (AUDIT): 40 Brief Intervention: Patient declined brief intervention Substance Abuse History in the last 12 months:  Yes.   Consequences of Substance Abuse: Medical Consequences:  Elevated liver enzymes Family Consequences:  Family cannot support him any longer Blackouts:  Does not remember after intoxication or status post seizure episodes Withdrawal Symptoms:   Cramps Diaphoresis Headaches Tremors Previous Psychotropic Medications: Yes  Psychological Evaluations: Yes  Past Medical History:  Past Medical History  Diagnosis Date  . Seizures ([place])   . Hypertension   . Depression   . Pancreatitis   . Liver cirrhosis ([place])   . Coronary artery disease   . Angina   . Shortness of breath   . Headache(784.0)   . Peripheral vascular disease [facility]) April 2011    Left Pop  . Hypercholesteremia   . Schizophrenia ([place])   . Bipolar 1 disorder ([place])   . Cancer of kidney ([place]) dx'd 04/2013    lt nephrectomy  . Breast CA ([place]) dx'd 2009    bil w/ bil masectomy and oral meds  . H/O suicide attempt 2015    overdose    Past Surgical History  Procedure Laterality Date  . Chest surgery    . Left leg surgery    .  Mastectomy    . Left kidney removal    . Breast surgery     Family History:  Family History  Problem Relation Age of Onset  . Stroke Other   . Cancer Other   . Hyperlipidemia Mother   . Hypertension Mother    Family Psychiatric  History: Patient denied family history of mental illness and substance abuse and reportedly his mother and grandmother was supportive and provided a lot of  support in the past he does not want to blame them for anything.  Tobacco Screening: '@FLOW' ([phone])::1)@ Social History:  History  Alcohol Use  . 0.0 oz/week     History  Drug Use No    Comment: former    Additional Social History: Patient reportedly homeless, multiple psychosocial stresses and reportedly her legal charges for larceny and saline.                           Allergies:   Allergies  Allergen Reactions  . Codeine Hives, Itching and Swelling  . Penicillins Swelling    Has patient had a PCN reaction causing immediate rash, facial/tongue/throat swelling, SOB or lightheadedness with hypotension: Yes Has patient had a PCN reaction causing severe rash involving mucus membranes or skin necrosis: Yes Has patient had a PCN reaction that required hospitalization Yes-ed visit Has patient had a PCN reaction occurring within the last 10 years: Yes If all of the above answers are "NO", then may proceed with Cephalosporin use.   . Depakote [Divalproex Sodium] Hives and Other (See Comments)    "Bug out and hallucinate"  . Morphine Itching  . Coconut Flavor Itching and Swelling  . Depakote Er [Divalproex Sodium Er] Hives  . Oxycodone Itching and Swelling  . Hydrocodone-Acetaminophen Itching and Rash   Lab Results:  Results for orders placed or performed during the hospital encounter of 12/29/15 (from the past 48 hour(s))  Comprehensive metabolic panel     Status: Abnormal   Collection Time: 12/29/15  6:26 PM  Result Value Ref Range   Sodium 140 135 - 145 mmol/L   Potassium 3.1 (L) 3.5 - 5.1 mmol/L   Chloride 112 (H) 101 - 111 mmol/L   CO2 22 22 - 32 mmol/L   Glucose, Bld 110 (H) 65 - 99 mg/dL   BUN 8 6 - 20 mg/dL   Creatinine, Ser 0.96 0.61 - 1.24 mg/dL   Calcium 7.7 (L) 8.9 - 10.3 mg/dL   Total Protein 5.2 (L) 6.5 - 8.1 g/dL   Albumin 2.9 (L) 3.5 - 5.0 g/dL   AST 21 15 - 41 U/L   ALT 15 (L) 17 - 63 U/L   Alkaline Phosphatase 32 (L) 38 - 126 U/L   Total  Bilirubin 0.3 0.3 - 1.2 mg/dL   GFR calc non Af Amer >60 >60 mL/min   GFR calc Af Amer >60 >60 mL/min    Comment: (NOTE) The eGFR has been calculated using the CKD EPI equation. This calculation has not been validated in all clinical situations. eGFR's persistently <60 mL/min signify possible Chronic Kidney Disease.    Anion gap 6 5 - 15  Ethanol     Status: Abnormal   Collection Time: 12/29/15  6:26 PM  Result Value Ref Range   Alcohol, Ethyl (B) 129 (H) <5 mg/dL    Comment:        LOWEST DETECTABLE LIMIT FOR SERUM ALCOHOL IS 5 mg/dL FOR MEDICAL PURPOSES ONLY   cbc  Status: Abnormal   Collection Time: 12/29/15  6:26 PM  Result Value Ref Range   WBC 4.1 4.0 - 10.5 K/uL   RBC 3.92 (L) 4.22 - 5.81 MIL/uL   Hemoglobin 11.9 (L) 13.0 - 17.0 g/dL   HCT 34.8 (L) 39.0 - 52.0 %   MCV 88.8 78.0 - 100.0 fL   MCH 30.4 26.0 - 34.0 pg   MCHC 34.2 30.0 - 36.0 g/dL   RDW 13.9 11.5 - 15.5 %   Platelets 156 219 - 758 K/uL  Salicylate level     Status: None   Collection Time: 12/29/15  6:26 PM  Result Value Ref Range   Salicylate Lvl <8.3 2.8 - 30.0 mg/dL  Acetaminophen level     Status: Abnormal   Collection Time: 12/29/15  6:26 PM  Result Value Ref Range   Acetaminophen (Tylenol), Serum <10 (L) 10 - 30 ug/mL    Comment:        THERAPEUTIC CONCENTRATIONS VARY SIGNIFICANTLY. A RANGE OF 10-30 ug/mL MAY BE AN EFFECTIVE CONCENTRATION FOR MANY PATIENTS. HOWEVER, SOME ARE BEST TREATED AT CONCENTRATIONS OUTSIDE THIS RANGE. ACETAMINOPHEN CONCENTRATIONS >150 ug/mL AT 4 HOURS AFTER INGESTION AND >50 ug/mL AT 12 HOURS AFTER INGESTION ARE OFTEN ASSOCIATED WITH TOXIC REACTIONS.   Rapid urine drug screen (hospital performed)     Status: Abnormal   Collection Time: 12/29/15  8:20 PM  Result Value Ref Range   Opiates NONE DETECTED NONE DETECTED   Cocaine POSITIVE (A) NONE DETECTED   Benzodiazepines POSITIVE (A) NONE DETECTED   Amphetamines NONE DETECTED NONE DETECTED    Tetrahydrocannabinol NONE DETECTED NONE DETECTED   Barbiturates NONE DETECTED NONE DETECTED    Comment:        DRUG SCREEN FOR MEDICAL PURPOSES ONLY.  IF CONFIRMATION IS NEEDED FOR ANY PURPOSE, NOTIFY LAB WITHIN 5 DAYS.        LOWEST DETECTABLE LIMITS FOR URINE DRUG SCREEN Drug Class       Cutoff (ng/mL) Amphetamine      1000 Barbiturate      200 Benzodiazepine   254 Tricyclics       982 Opiates          300 Cocaine          300 THC              50     Blood Alcohol level:  Lab Results  Component Value Date   [facility] 129* 12/29/2015   ETH <5 64/15/8309    Metabolic Disorder Labs:  Lab Results  Component Value Date   HGBA1C 5.3 09/11/2014   MPG 105 09/11/2014   MPG 100 12/24/2012   No results found for: PROLACTIN Lab Results  Component Value Date   CHOL 321* 09/11/2014   TRIG 134 07/15/2015   HDL 77 09/11/2014   CHOLHDL 4.2 09/11/2014   VLDL 15 09/11/2014   LDLCALC 229* 09/11/2014   LDLCALC 181* 04/16/2014    Current Medications: Current Facility-Administered Medications  Medication Dose Route Frequency Provider Last Rate Last Dose  . alum & mag hydroxide-simeth (MAALOX/MYLANTA) 200-200-20 MG/5ML suspension 30 mL  30 mL Oral Q4H PRN [name], NP      . amLODipine (NORVASC) tablet 5 mg  5 mg Oral Daily [name], NP   5 mg at 12/31/15 0755  . atorvastatin (LIPITOR) tablet 20 mg  20 mg Oral Daily [name], NP   20 mg at 12/31/15 0755  . hydrOXYzine (ATARAX/VISTARIL) tablet 25 mg  25 mg Oral Q6H PRN [name], NP      . ibuprofen (ADVIL,MOTRIN) tablet 600 mg  600 mg Oral Q8H PRN [name], NP   600 mg at 12/30/15 1720  . LORazepam (ATIVAN) tablet 1 mg  1 mg Oral Q6H PRN [name], NP   1 mg at 12/30/15 2113  . magnesium hydroxide (MILK OF MAGNESIA) suspension 30 mL  30 mL Oral Daily PRN [name], NP      . metoprolol tartrate (LOPRESSOR) tablet 25 mg  25 mg Oral BID [name], NP   25 mg at 12/31/15 0755  . ondansetron  (ZOFRAN) tablet 4 mg  4 mg Oral Q8H PRN [name], NP      . traZODone (DESYREL) tablet 300 mg  300 mg Oral QHS [name], NP   300 mg at 12/30/15 2113   PTA Medications: Prescriptions prior to admission  Medication Sig Dispense Refill Last Dose  . amLODipine (NORVASC) 5 MG tablet Take 1 tablet (5 mg total) by mouth daily. (Patient not taking: Reported on 12/30/2015) 30 tablet 0 Not Taking at Unknown time  . ARIPiprazole (ABILIFY) 10 MG tablet Take 1 tablet (10 mg total) by mouth daily. (Patient not taking: Reported on 12/30/2015) 30 tablet 0 Not Taking at Unknown time  . atorvastatin (LIPITOR) 20 MG tablet Take 1 tablet (20 mg total) by mouth every morning. (Patient not taking: Reported on 12/30/2015) 30 tablet 0 Not Taking at Unknown time  . DULoxetine (CYMBALTA) 30 MG capsule Take 1 capsule (30 mg total) by mouth daily. (Patient not taking: Reported on 12/30/2015) 30 capsule 0 Not Taking at Unknown time  . hydrOXYzine (ATARAX/VISTARIL) 25 MG tablet Take 1 tablet (25 mg total) by mouth every 6 (six) hours as needed for anxiety. (Patient not taking: Reported on 12/30/2015) 30 tablet 0 Not Taking at Unknown time  . metoprolol tartrate (LOPRESSOR) 25 MG tablet Take 1 tablet (25 mg total) by mouth 2 (two) times daily. (Patient not taking: Reported on 12/30/2015) 30 tablet 0 Not Taking at Unknown time  . traZODone (DESYREL) 100 MG tablet Take 1 tablet (100 mg total) by mouth at bedtime. (Patient taking differently: Take 300 mg by mouth at bedtime. ) 30 tablet 0 12/28/2015    Musculoskeletal: Strength & Muscle Tone: within normal limits Gait & Station: normal Patient leans: Right  Psychiatric Specialty Exam: Physical Exam Full physical performed in Emergency Department. I have reviewed this assessment and concur with its findings.   ROS complaining about depression, anxiety, suicidal ideation, status post intentional drug overdose, relapse on drugs of Abuse, tremors, shakes, nervousness and  upset stomach. No Fever-chills, No Headache, No changes with Vision or hearing, reports vertigo No problems swallowing food or Liquids, No Chest pain, Cough or Shortness of Breath, No Abdominal pain, No Nausea or Vommitting, Bowel movements are regular, No Blood in stool or Urine, No dysuria, No new skin rashes or bruises, No new joints pains-aches,  No new weakness, tingling, numbness in any extremity, No recent weight gain or loss, No polyuria, polydypsia or polyphagia,   A full 10 point Review of Systems was done, except as stated above, all other Review of Systems were negative.  Blood pressure 103/72, pulse 85, temperature 98.9 F (37.2 C), temperature source Oral, resp. rate 18, height '5\' 6"'  (1.676 m), weight 71.895 kg (158 lb 8 oz), SpO2 100 %.Body mass index is 25.59 kg/(m^2).  General Appearance: Disheveled and Guarded  Eye Contact:  Fair  Speech:  Clear and Coherent and Slow  Volume:  Decreased  Mood:  Anxious, Depressed, Hopeless and Worthless  Affect:  Constricted and Depressed  Thought Process:  Coherent and Goal Directed  Orientation:  Full (Time, Place, and Person)  Thought Content:  Rumination  Suicidal Thoughts:  Yes.  with intent/plan  Homicidal Thoughts:  No  Memory:  Immediate;   Good Recent;   Fair Remote;   Fair  Judgement:  Impaired  Insight:  Fair  Psychomotor Activity:  Decreased  Concentration:  Concentration: Fair and Attention Span: Fair  Recall:  [employer] of Knowledge:  Good  Language:  Good  Akathisia:  Negative  Handed:  Right  AIMS (if indicated):     Assets:  Communication Skills Desire for Improvement Leisure Time Resilience  ADL's:  Impaired  Cognition:  WNL  Sleep:  Number of Hours: 6.75           Treatment Plan Summary: Daily contact with patient to assess and evaluate symptoms and progress in treatment and Medication management  Observation Level/Precautions:  15 minute checks  Laboratory:  Reviewed admission labs   Psychotherapy:  Substance abuse counseling, milieu therapy and group therapies   Medications:  Ativan detox for alcohol and to start his home medications as noted above.   Consultations:  As needed   Discharge Concerns:  Safety   Estimated LOS:5-7 days   Other:  May benefit from residential substance abuse treatment program or partial hospitalization when completed detox treatment and contract for safety    I certify that inpatient services furnished can reasonably be expected to improve the patient's condition.    Ambrose Finland, MD 6/14/20171:03 PM

## 2015-12-31 NOTE — Tx Team (Signed)
Interdisciplinary Treatment Plan Update (Adult) Date: 12/31/2015    Time Reviewed: 9:30 AM  Progress in Treatment: Attending groups: Continuing to assess, patient new to milieu Participating in groups: Continuing to assess, patient new to milieu Taking medication as prescribed: Yes Tolerating medication: Yes Family/Significant other contact made: No, CSW assessing for appropriate contacts Patient understands diagnosis: Yes Discussing patient identified problems/goals with staff: Yes Medical problems stabilized or resolved: Yes Denies suicidal/homicidal ideation: Yes Issues/concerns per patient self-inventory: Yes Other:  New problem(s) identified: N/A  Discharge Plan or Barriers: CSW continuing to assess, patient new to milieu.  Reason for Continuation of Hospitalization:  Depression Anxiety Medication Stabilization   Comments: N/A  Estimated length of stay: 3-5 days    Patient is a 35 year old male who presented to the hospital with SI attempt by overdosing on medications mixed with alcohol. Pt reports primary trigger(s) for admission was PTSD from past abuse, chronic pain, and feeling like his medication are not working. Patient with multiple SI attempts in the past and multiple previous admissions to [facility] last in May 2017. Patient will benefit from crisis stabilization, medication evaluation, group therapy and psycho education in addition to case management for discharge planning. At discharge, it is recommended that Pt remain compliant with established discharge plan and continued treatment.   Review of initial/current patient goals per problem list:  1. Goal(s): Patient will participate in aftercare plan   Met: No   Target date: 3-5 days post admission date   As evidenced by: Patient will participate within aftercare plan AEB aftercare provider and housing plan at discharge being identified.  6/14: Goal not met: CSW assessing for appropriate referrals for pt and  will have follow up secured prior to d/c.    2. Goal (s): Patient will exhibit decreased depressive symptoms and suicidal ideations.   Met: No   Target date: 3-5 days post admission date   As evidenced by: Patient will utilize self rating of depression at 3 or below and demonstrate decreased signs of depression or be deemed stable for discharge by MD.   6/14: Goal not met: Pt presents with flat affect and depressed mood.  Pt admitted with depression rating of 10.  Pt to show decreased sign of depression and a rating of 3 or less before d/c.     4. Goal(s): Patient will demonstrate decreased signs of withdrawal due to substance abuse   Met: Goal progressing   Target date: 3-5 days post admission date   As evidenced by: Patient will produce a CIWA/COWS score of 0, have stable vitals signs, and no symptoms of withdrawal  6/13: Goal progressing. Patient with CIWA of 6, experiencing sweating, headache, and anxiety.    Attendees:  Patient:    Family:    Physician: Dr. Parke Poisson, MD  12/31/2015   Nursing: Marcella Dubs, Mayra Neer, [address], [place], [place] 12/31/2015   Clinical Social Worker: Erasmo Downer [place] Hagger LCSW, Alvina Chou Smart LCSW 12/31/2015   Other:    Clinical: May Malachi Carl, NP 12/31/2015   Other:  12/31/2015   Other:               Scribe for Treatment Team:  Tilden Fossa, [place]

## 2015-12-31 NOTE — BHH Group Notes (Signed)
[facility] LCSW Aftercare Discharge Planning Group Note  12/31/2015  8:45 AM  Participation Quality: Did Not Attend. Patient invited to participate but declined.  Tilden Fossa, MSW, [place] Clinical Social Worker [facility] [phone]

## 2015-12-31 NOTE — Progress Notes (Signed)
Patient attended N/A group tonight.  

## 2015-12-31 NOTE — Progress Notes (Signed)
Recreation Therapy Notes  Date: 06.14.2017 Time: 9:30am Location: [address] Group Room   Group Topic: Stress Management  Goal Area(s) Addresses:  Patient will actively participate in stress management techniques presented during session.   Behavioral Response: Did not attend.   Laureen Ochs Avalee Castrellon, LRT/CTRS        [employer] L 12/31/2015 1:45 PM

## 2016-01-01 ENCOUNTER — Other Ambulatory Visit: Payer: Self-pay

## 2016-01-01 DIAGNOSIS — R45851 Suicidal ideations: Secondary | ICD-10-CM

## 2016-01-01 MED ORDER — MIRTAZAPINE 15 MG PO TABS
15.0000 mg | ORAL_TABLET | Freq: Every day | ORAL | Status: DC
  Administered 2016-01-01: 15 mg via ORAL
  Filled 2016-01-01 (×3): qty 1

## 2016-01-01 MED ORDER — AMLODIPINE BESYLATE 2.5 MG PO TABS
2.5000 mg | ORAL_TABLET | Freq: Every day | ORAL | Status: DC
  Administered 2016-01-02 – 2016-01-04 (×3): 2.5 mg via ORAL
  Filled 2016-01-01 (×5): qty 1

## 2016-01-01 MED ORDER — METOPROLOL TARTRATE 25 MG PO TABS
12.5000 mg | ORAL_TABLET | Freq: Two times a day (BID) | ORAL | Status: DC
  Administered 2016-01-02 – 2016-01-05 (×6): 12.5 mg via ORAL
  Filled 2016-01-01 (×3): qty 1
  Filled 2016-01-01: qty 14
  Filled 2016-01-01 (×6): qty 1
  Filled 2016-01-01: qty 14
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
## 2016-01-01 NOTE — Progress Notes (Signed)
[facility] MD Progress Note  01/01/2016 5:37 PM [name]  MRN:  [id number] Subjective:  Patient states he still feels depressed, anxious- he reports feeling " tired", and states " I think I am still in withdrawal". Denies medication side effects at this time. Objective : I have discussed case with treatment team and have met with patient. Patient is a 35 year old male with a history of depression, substance abuse ( describes cocaine and alcohol as substances of choice )  At this time patient is reporting some ongoing depression, anxiety, some passive SI, but  Denies any plan or intention of hurting self and contracts for safety on the unit at this time. Denies medication side effects. Tends to isolate, spends most time in room. No disruptive or agitated behaviors on unit. Presents with mild distal tremors, no psychomotor restlessness, no diaphoresis, no acute distress .  Principal Problem: Depression, major, severe recurrence ([name]) Diagnosis:   Patient Active Problem List   Diagnosis Date Noted  . Major depressive disorder, recurrent episode, severe, with psychotic behavior ([place]) [F33.3] 12/30/2015  . Depression, major, severe recurrence ([name]) [F33.2] 12/30/2015  . Substance induced mood disorder ([name]) [F19.94] 12/02/2015  . Mood disorder in conditions classified elsewhere [F06.30]   . MDD (major depressive disorder), recurrent severe, without psychosis ([name]) [F33.2] 10/08/2015  . Malnutrition of moderate degree [E44.0] 09/24/2015  . Alcohol withdrawal ([name]) [F10.239] 09/23/2015  . Tobacco use disorder [F17.200] 07/16/2015  . Elevated transaminase level [R74.0]   . Drug overdose, intentional ([name]) [T50.902A] 07/12/2015  . Alcohol use disorder, severe, dependence ([name]) [F10.20] 04/15/2015  . Cocaine abuse with cocaine-induced mood disorder ([name]) [F14.14] 04/11/2015  . Overdose [T50.901A] 04/10/2015  . Severe recurrent major depressive disorder with psychotic features ([name]) [F33.3]   . Severe  major depression with psychotic features ([name]) [F32.3] 09/11/2014  . Alcohol-induced mood disorder ([name]) [F10.94] 09/10/2014  . Suicidal ideation [R45.851]   . Tylenol overdose [T39.1X4A]   . Polysubstance abuse [F19.10]   . Overdose of acetaminophen [T39.1X4A] 08/03/2014  . Overdose by acetaminophen [T39.1X4A] 08/03/2014  . Cocaine abuse [F14.10] 04/16/2014  . Thrombocytopenia ([place]) [D69.6] 04/15/2014  . Urinary tract infection, site not specified [N39.0] 04/15/2014  . Chest pain, unspecified [R07.9] 04/15/2014  . D-dimer, elevated [R79.1] 04/15/2014  . Transaminitis [R74.0] 09/24/2013  . Cocaine dependence ([name]) [F14.20] 09/20/2013  . 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 Seizure ([name]) [R56.9] 03/15/2013  . Syncope [R55] 01/02/2013  . Leukocytopenia, unspecified [D72.819] 01/02/2013  . Left kidney mass [N28.89] 12/24/2012  . PTSD (post-traumatic stress disorder) [F43.10] 07/06/2012  . Peripheral vascular disease ([name]) [I73.9] 01/14/2012  . SEIZURE DISORDER [R56.9] 10/03/2008  . LIVER FUNCTION TESTS, ABNORMAL [R94.5] 12/29/2007  . HYPERCHOLESTEROLEMIA [E78.00] 03/21/2007  . Essential hypertension [I10] 03/21/2007   Total Time spent with patient: 20 minutes    Past Medical History:  Past Medical History  Diagnosis Date  . Seizures ([place])   . Hypertension   . Depression   . Pancreatitis   . Liver cirrhosis ([name])   . Coronary artery disease   . Angina   . Shortness of breath   . Headache(784.0)   . Peripheral vascular disease [facility]) April 2011    Left Pop  . Hypercholesteremia   . Schizophrenia ([place])   . Bipolar 1 disorder ([name])   . Cancer of kidney ([place]) dx'd 04/2013    lt nephrectomy  . Breast CA ([name]) dx'd 2009    bil w/ bil masectomy and oral meds  . H/O suicide attempt  2015    overdose    Past Surgical History  Procedure Laterality Date  . Chest surgery    . Left leg surgery    . Mastectomy    . Left kidney removal    . Breast surgery     Family History:   Family History  Problem Relation Age of Onset  . Stroke Other   . Cancer Other   . Hyperlipidemia Mother   . Hypertension Mother     Social History:  History  Alcohol Use  . 0.0 oz/week     History  Drug Use No    Comment: former    Social History   Social History  . Marital Status: Single    Spouse Name: N/A  . Number of Children: N/A  . Years of Education: N/A   Social History Main Topics  . Smoking status: Current Every Day Smoker  . Smokeless tobacco: None  . Alcohol Use: 0.0 oz/week  . Drug Use: No     Comment: former  . Sexual Activity: Yes    Birth Control/ Protection: Condom     Comment: anal   Other Topics Concern  . None   Social History Narrative   ** Merged History Encounter **       Additional Social History:   Sleep: Fair  Appetite:  Fair  Current Medications: Current Facility-Administered Medications  Medication Dose Route Frequency Provider Last Rate Last Dose  . alum & mag hydroxide-simeth (MAALOX/MYLANTA) 200-200-20 MG/5ML suspension 30 mL  30 mL Oral Q4H PRN [name], NP      . amLODipine (NORVASC) tablet 5 mg  5 mg Oral Daily [name], NP   5 mg at 12/31/15 0755  . atorvastatin (LIPITOR) tablet 20 mg  20 mg Oral Daily [name], NP   20 mg at 01/01/16 0900  . hydrOXYzine (ATARAX/VISTARIL) tablet 25 mg  25 mg Oral Q6H PRN [name], NP      . ibuprofen (ADVIL,MOTRIN) tablet 600 mg  600 mg Oral Q8H PRN [name], NP   600 mg at 12/31/15 1617  . LORazepam (ATIVAN) tablet 1 mg  1 mg Oral Q6H PRN [name], NP   1 mg at 12/31/15 1815  . magnesium hydroxide (MILK OF MAGNESIA) suspension 30 mL  30 mL Oral Daily PRN [name], NP      . metoprolol tartrate (LOPRESSOR) tablet 25 mg  25 mg Oral BID [name], NP   25 mg at 12/31/15 1719  . ondansetron (ZOFRAN) tablet 4 mg  4 mg Oral Q8H PRN [name], NP      . traZODone (DESYREL) tablet 300 mg  300 mg Oral QHS [name], NP   300 mg at  12/31/15 2155    Lab Results: No results found for this or any previous visit (from the past 74 hour(s)).  Blood Alcohol level:  Lab Results  Component Value Date   [facility] 129* 12/29/2015   ETH <5 11/29/2015    Physical Findings: AIMS: Facial and Oral Movements Muscles of Facial Expression: None, normal Lips and Perioral Area: None, normal Jaw: None, normal Tongue: None, normal,Extremity Movements Upper (arms, wrists, hands, fingers): None, normal Lower (legs, knees, ankles, toes): None, normal, Trunk Movements Neck, shoulders, hips: None, normal, Overall Severity Severity of abnormal movements (highest score from questions above): None, normal Incapacitation due to abnormal movements: None, normal [name] awareness of abnormal movements (rate only patient's report):  No Awareness, Dental Status Current problems with teeth and/or dentures?: No Does patient usually wear dentures?: No  CIWA:  CIWA-Ar Total: 6 COWS:     Musculoskeletal: Strength & Muscle Tone: within normal limits- mild distal tremors  Gait & Station: normal Patient leans: N/A  Psychiatric Specialty Exam: Physical Exam  ROS denies headache,  Describes some lightheadedness when getting out of bed, denies chest pain at this time , no shortness of breath , no vomiting today, (+) nausea   Blood pressure 93/67, pulse 65, temperature 98.6 F (37 C), temperature source Oral, resp. rate 16, height _0  (1.676 m), weight 158 lb 8 oz (71.895 kg), SpO2 100 %.Body mass index is 25.59 kg/(m^2).  General Appearance: Fairly Groomed  Eye Contact:  Fair  Speech:  Normal Rate  Volume:  Decreased  Mood:  Anxious and Depressed  Affect:  Constricted  Thought Process:  Linear  Orientation:  Other:  fully alert and attentive   Thought Content:  denies hallucinations, no delusions at this time, not internally preoccupied   Suicidal Thoughts:  Yes.  without intent/plan- describes passive SI, but denies plan or intention of hurting  self or of SI, and contracts for safety on the unit   Homicidal Thoughts:  No  Memory:  recent and remote grossly intact   Judgement:  Fair  Insight:  Fair  Psychomotor Activity:  Decreased- mild distal tremors , but no restlessness or agitation   Concentration:  Concentration: Good and Attention Span: Good  Recall:  Good  Fund of Knowledge:  Good  Language:  Good  Akathisia:  Negative  Handed:  Right  AIMS (if indicated):     Assets:  Desire for Improvement Resilience  ADL's:  Intact  Cognition:  WNL  Sleep:  Number of Hours: 6.75  Assessment - patient is reporting ongoing depression, some anxiety, and some symptoms of alcohol WDL, such as sense of anxiety, apprehension, mild distal tremors. Vitals are stable- no increased BP or tachycardia at this time.  Describes vague, passive SI, but denies plan or intention of hurting self and contracts for safety on the unit . Patient reports he had been on Cymbalta , Trazodone, Abilify, but that " they were no longer helping me", and is interested in new medication regimen . We discussed options- insomnia is an issue for him, and agreed to try REMERON trial to address depression, anxiety, depression.    Treatment Plan Summary: Daily contact with patient to assess and evaluate symptoms and progress in treatment, Medication management, Plan inpatient admission  and medications as below Encourage group, milieu participation, to work on coping skills and symptom reduction  Continue Ativan  PRNS for alcohol withdrawal symptoms as needed D/C Trazodone Start REMERON 15 mgrs QHS for depression, anxiety, insomnia  Will decrease Lopressor dose to 12.5 mgrs BID and Norvasc to 2.5 mgrs QDAY  as patient's BP has been trending low, and describes some lightheadedness  Recheck EKG to monitor QTc  And BMP to follow up on K+ serum level   COBOS, Felicita Gage, MD 01/01/2016, 5:37 PM

## 2016-01-01 NOTE — Progress Notes (Signed)
Adult Psychoeducational Group Note  Date:  01/01/2016 Time:  10:35 PM  Group Topic/Focus:  Wrap-Up Group:   The focus of this group is to help patients review their daily goal of treatment and discuss progress on daily workbooks.  Participation Level:  Active  Participation Quality:  Appropriate  Affect:  Appropriate  Cognitive:  Alert  Insight: Appropriate  Engagement in Group:  Engaged  Modes of Intervention:  Discussion  Additional Comments:  Patient states, "my day was all right". Patient goal for today was to get medication. Patient met goal.   Meyer Arora L Kelley Knoth 01/01/2016, 10:35 PM

## 2016-01-01 NOTE — Progress Notes (Signed)
Patient ID: [name], male   DOB: [birth year], 35 y.o.   MRN: [id number]  DAR: Pt. Denies HI and A/V Hallucinations. He reports passive SI but is able to contract for safety. He reports sleep is fair, appetite is fair, energy level is low, and concentration is poor. He rates depression 10/10, hopelessness 10/10, and anxiety 10/10. Patient does not report any pain but does report some dizziness and lightheadedness. He was given plenty of fluids and encouraged to be safe during ambulation. He verbalized understanding. Blood pressure medications were not administered due to safety. Support and encouragement provided to the patient. Scheduled medications administered to patient per physician's orders. Patient is minimal but cooperative. He is seen sleeping throughout the day. Q15 minute checks are maintained for safety.

## 2016-01-01 NOTE — BHH Group Notes (Signed)
[place] LCSW Group Therapy 01/01/2016 1:15 PM Type of Therapy: Group Therapy Participation Level: Active  Participation Quality: Attentive, Sharing and Supportive  Affect: Depressed and Flat  Cognitive: Alert and Oriented  Insight: Developing/Improving and Engaged  Engagement in Therapy: Developing/Improving and Engaged  Modes of Intervention: Activity, Clarification, Confrontation, Discussion, Education, Exploration, Limit-setting, Orientation, Problem-solving, Rapport Building, Art therapist, Socialization and Support  Summary of Progress/Problems: Patient was attentive and engaged with speaker from [place]. Patient was attentive to speaker while they shared their story of dealing with mental health and overcoming it. Patient expressed interest in their programs and services and received information on their agency. Patient processed ways they can relate to the speaker.   Tilden Fossa, LCSW Clinical Social Worker [facility] [phone]

## 2016-01-02 DIAGNOSIS — F332 Major depressive disorder, recurrent severe without psychotic features: Secondary | ICD-10-CM | POA: Insufficient documentation

## 2016-01-02 LAB — BASIC METABOLIC PANEL
Anion gap: 8 (ref 5–15)
BUN: 12 mg/dL (ref 6–20)
CHLORIDE: 105 mmol/L (ref 101–111)
CO2: 25 mmol/L (ref 22–32)
CREATININE: 1.18 mg/dL (ref 0.61–1.24)
Calcium: 9 mg/dL (ref 8.9–10.3)
GFR calc non Af Amer: >60 mL/min (ref >60)
Glucose, Bld: 77 mg/dL (ref 65–99)
POTASSIUM: 3.8 mmol/L (ref 3.5–5.1)
Sodium: 138 mmol/L (ref 135–145)

## 2016-01-02 MED ORDER — QUETIAPINE FUMARATE 25 MG PO TABS
25.0000 mg | ORAL_TABLET | Freq: Every day | ORAL | Status: DC
  Administered 2016-01-02 – 2016-01-03 (×2): 25 mg via ORAL
  Filled 2016-01-02 (×3): qty 1

## 2016-01-02 NOTE — BHH Group Notes (Signed)
[facility] LCSW Aftercare Discharge Planning Group Note   01/02/2016 9:48 AM  Participation Quality:  Appropriate   Mood/Affect:  Appropriate  Depression Rating:  5  Anxiety Rating:  5  Thoughts of Suicide:  No Will you contract for safety?   NA  Current AVH:  No  Plan for Discharge/Comments:  Pt states that he slept poorly last night. "I want to try seroquel." Pt has [facility] screening for 8am on Tuesday and was provided with [facility] and [facility] information/applications per his request. Pt encouraged to call [facility] today to complete phone screening and to complete [facility] application by this afternoon. Pt denies any other concerns.   Transportation Means: taxi voucher if going to [employer].   Supports: family --mom and sister  Smart, Nira Conn LCSW

## 2016-01-02 NOTE — Progress Notes (Signed)
Recreation Therapy Notes  Date: 06.16.2017 Time: 9:30am Location: [address] Hall Group Room   Group Topic: Stress Management  Goal Area(s) Addresses:  Patient will actively participate in stress management techniques presented during session.   Behavioral Response: Did not attend.   Laureen Ochs Aadyn Buchheit, LRT/CTRS        Janis Cuffe L 01/02/2016 10:28 AM

## 2016-01-02 NOTE — Progress Notes (Addendum)
[name] is seen at the med window..first thing this morning. HE is quiet, shy and does not say a lot. A after he takes his medications, he completes his daily assessment and on it he wrote he has had SI today...but he contracts to " not hurt myself" today, with this Probation officer, when he is asked about this. HE rates his depression, hopelessness and anxiety " 9/9/9/", respectively. R Safety is in pale and poc cont.
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
## 2016-01-02 NOTE — Progress Notes (Signed)
Pt states that [facility] turned him down due to "my medical history" and [facility] turned him down to "Being on probation." Pt is hoping the [facility] will accept him on Tuesday morning after his screening. Pt has screening at 8:00Am.  Maxie Better, MSW, LCSW Clinical Social Worker 01/02/2016 3:12 PM

## 2016-01-02 NOTE — BHH Group Notes (Signed)
[place] LCSW Group Therapy  01/02/2016 1:31 PM  Type of Therapy:  Group Therapy  Participation Level:  Did Not Attend-pt resting in room. Chose to remain in bed.   Modes of Intervention:  Confrontation, Discussion, Education, Exploration, Problem-solving, Rapport Building, Socialization and Support  Summary of Progress/Problems: Feelings around Relapse. Group members discussed the meaning of relapse and shared personal stories of relapse, how it affected them and others, and how they perceived themselves during this time. Group members were encouraged to identify triggers, warning signs and coping skills used when facing the possibility of relapse. Social supports were discussed and explored in detail. Post Acute Withdrawal Syndrome (handout provided) was introduced and examined. Pt's were encouraged to ask questions, talk about key points associated with PAWS, and process this information in terms of relapse prevention.   Smart, Sheliah Fiorillo LCSW 01/02/2016, 1:31 PM

## 2016-01-02 NOTE — Progress Notes (Signed)
Patient did not attend the evening speaker [place] meeting. Pt reported not feeling physically well and remained in bed. Nurse met with pt shortly after.

## 2016-01-02 NOTE — Progress Notes (Addendum)
Patient ID: [name], male   DOB: 1981-01-11, 35 y.o.   MRN: [id number] [facility] MD Progress Note  01/02/2016 12:55 PM [name]  MRN:  [id number] Subjective:  Patient reports he is feeling slightly better, but still depressed, sad . At this time he denies any active suicidal ideations,self injurious ideations, and states passive suicidal ruminations are subsiding  He states hen does not feel Remeron trial worked well for him and did not sleep well in spite of it. States " I have heard Seroquel is good for sleep problems and being anxious , maybe we can try it".  Objective : I have discussed case with treatment team and have met with patient. Patient presents slightly improved compared to initial presentation. He presents more verbal, better engaged and better related, with improved rate of speech and eye contact. He still reports some depression and does continue to  Present constricted in affect, although he smiles at times appropriately. Denies medication side effects but feels Remeron did not help him sleep better and wants to stop it. As above, interested in Seroquel trial. No disruptive behaviors on unit, remains somewhat isolative - limited group attendance . Currently not presenting with significant symptoms of WDL - minimal distal tremors, no diaphoresis, no acute distress, vitals stable  Labs- follow up BMP within normal limits  Principal Problem: Depression, major, severe recurrence ([place]) Diagnosis:   Patient Active Problem List   Diagnosis Date Noted  . Major depressive disorder, recurrent episode, severe, with psychotic behavior ([place]) [F33.3] 12/30/2015  . Depression, major, severe recurrence ([place]) [F33.2] 12/30/2015  . Substance induced mood disorder ([place]) [F19.94] 12/02/2015  . Mood disorder in conditions classified elsewhere [F06.30]   . MDD (major depressive disorder), recurrent severe, without psychosis ([place]) [F33.2] 10/08/2015  . Malnutrition of moderate degree [E44.0]  09/24/2015  . Alcohol withdrawal ([place]) [F10.239] 09/23/2015  . Tobacco use disorder [F17.200] 07/16/2015  . Elevated transaminase level [R74.0]   . Drug overdose, intentional ([place]) [T50.902A] 07/12/2015  . Alcohol use disorder, severe, dependence ([place]) [F10.20] 04/15/2015  . Cocaine abuse with cocaine-induced mood disorder ([place]) [F14.14] 04/11/2015  . Overdose [T50.901A] 04/10/2015  . Severe recurrent major depressive disorder with psychotic features ([place]) [F33.3]   . Severe major depression with psychotic features ([place]) [F32.3] 09/11/2014  . Alcohol-induced mood disorder ([place]) [F10.94] 09/10/2014  . Suicidal ideation [R45.851]   . Tylenol overdose [T39.1X4A]   . Polysubstance abuse [F19.10]   . Overdose of acetaminophen [T39.1X4A] 08/03/2014  . Overdose by acetaminophen [T39.1X4A] 08/03/2014  . Cocaine abuse [F14.10] 04/16/2014  . Thrombocytopenia ([place]) [D69.6] 04/15/2014  . Urinary tract infection, site not specified [N39.0] 04/15/2014  . Chest pain, unspecified [R07.9] 04/15/2014  . D-dimer, elevated [R79.1] 04/15/2014  . Transaminitis [R74.0] 09/24/2013  . Cocaine dependence ([place]) [F14.20] 09/20/2013  . 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 Seizure ([place]) [R56.9] 03/15/2013  . Syncope [R55] 01/02/2013  . Leukocytopenia, unspecified [D72.819] 01/02/2013  . Left kidney mass [N28.89] 12/24/2012  . PTSD (post-traumatic stress disorder) [F43.10] 07/06/2012  . Peripheral vascular disease ([place]) [I73.9] 01/14/2012  . SEIZURE DISORDER [R56.9] 10/03/2008  . LIVER FUNCTION TESTS, ABNORMAL [R94.5] 12/29/2007  . HYPERCHOLESTEROLEMIA [E78.00] 03/21/2007  . Essential hypertension [I10] 03/21/2007   Total Time spent with patient: 20 minutes    Past Medical History:  Past Medical History  Diagnosis Date  . Seizures ([place])   . Hypertension   . Depression   . Pancreatitis   . Liver cirrhosis ([place])   . Coronary artery disease   .  Angina   . Shortness of breath   . Headache(784.0)   .  Peripheral vascular disease [facility]) April 2011    Left Pop  . Hypercholesteremia   . Schizophrenia ([place])   . Bipolar 1 disorder ([place])   . Cancer of kidney ([place]) dx'd 04/2013    lt nephrectomy  . Breast CA ([place]) dx'd 2009    bil w/ bil masectomy and oral meds  . H/O suicide attempt 2015    overdose    Past Surgical History  Procedure Laterality Date  . Chest surgery    . Left leg surgery    . Mastectomy    . Left kidney removal    . Breast surgery     Family History:  Family History  Problem Relation Age of Onset  . Stroke Other   . Cancer Other   . Hyperlipidemia Mother   . Hypertension Mother     Social History:  History  Alcohol Use  . 0.0 oz/week     History  Drug Use No    Comment: former    Social History   Social History  . Marital Status: Single    Spouse Name: N/A  . Number of Children: N/A  . Years of Education: N/A   Social History Main Topics  . Smoking status: Current Every Day Smoker  . Smokeless tobacco: None  . Alcohol Use: 0.0 oz/week  . Drug Use: No     Comment: former  . Sexual Activity: Yes    Birth Control/ Protection: Condom     Comment: anal   Other Topics Concern  . None   Social History Narrative   ** Merged History Encounter **       Additional Social History:   Sleep: Fair  Appetite:  Improving   Current Medications: Current Facility-Administered Medications  Medication Dose Route Frequency Provider Last Rate Last Dose  . alum & mag hydroxide-simeth (MAALOX/MYLANTA) 200-200-20 MG/5ML suspension 30 mL  30 mL Oral Q4H PRN [name], NP      . amLODipine (NORVASC) tablet 2.5 mg  2.5 mg Oral Daily Jenne Campus, MD   2.5 mg at 01/02/16 0921  . atorvastatin (LIPITOR) tablet 20 mg  20 mg Oral Daily [name], NP   20 mg at 01/02/16 6468  . hydrOXYzine (ATARAX/VISTARIL) tablet 25 mg  25 mg Oral Q6H PRN [name], NP      . ibuprofen (ADVIL,MOTRIN) tablet 600 mg  600 mg Oral Q8H PRN [name], NP   600  mg at 12/31/15 1617  . LORazepam (ATIVAN) tablet 1 mg  1 mg Oral Q6H PRN [name], NP   1 mg at 12/31/15 1815  . magnesium hydroxide (MILK OF MAGNESIA) suspension 30 mL  30 mL Oral Daily PRN [name], NP      . metoprolol tartrate (LOPRESSOR) tablet 12.5 mg  12.5 mg Oral BID Jenne Campus, MD   12.5 mg at 01/02/16 0321  . mirtazapine (REMERON) tablet 15 mg  15 mg Oral QHS Jenne Campus, MD   15 mg at 01/01/16 2103  . ondansetron (ZOFRAN) tablet 4 mg  4 mg Oral Q8H PRN [name], NP        Lab Results:  Results for orders placed or performed during the hospital encounter of 12/30/15 (from the past 48 hour(s))  Basic metabolic panel     Status: None   Collection Time: 01/02/16  6:08 AM  Result Value Ref Range   Sodium 138 135 - 145 mmol/L   Potassium 3.8 3.5 - 5.1 mmol/L   Chloride 105 101 - 111 mmol/L   CO2 25 22 - 32 mmol/L   Glucose, Bld 77 65 - 99 mg/dL   BUN 12 6 - 20 mg/dL   Creatinine, Ser 1.18 0.61 - 1.24 mg/dL   Calcium 9.0 8.9 - 10.3 mg/dL   GFR calc non Af Amer >60 >60 mL/min   GFR calc Af Amer >60 >60 mL/min    Comment: (NOTE) The eGFR has been calculated using the CKD EPI equation. This calculation has not been validated in all clinical situations. eGFR's persistently <60 mL/min signify possible Chronic Kidney Disease.    Anion gap 8 5 - 15    Comment: Performed at [facility]    Blood Alcohol level:  Lab Results  Component Value Date   [facility] 129* 12/29/2015   ETH <5 11/29/2015    Physical Findings: AIMS: Facial and Oral Movements Muscles of Facial Expression: None, normal Lips and Perioral Area: None, normal Jaw: None, normal Tongue: None, normal,Extremity Movements Upper (arms, wrists, hands, fingers): None, normal Lower (legs, knees, ankles, toes): None, normal, Trunk Movements Neck, shoulders, hips: None, normal, Overall Severity Severity of abnormal movements (highest score from questions above): None,  normal Incapacitation due to abnormal movements: None, normal [name] awareness of abnormal movements (rate only patient's report): No Awareness, Dental Status Current problems with teeth and/or dentures?: No Does patient usually wear dentures?: No  CIWA:  CIWA-Ar Total: 6 COWS:     Musculoskeletal: Strength & Muscle Tone: within normal limits- mild distal tremors  Gait & Station: normal Patient leans: N/A  Psychiatric Specialty Exam: Physical Exam  ROS denies headache, denies chest pain , no shortness of breath , no vomiting  Blood pressure 117/79, pulse 82, temperature 97.7 F (36.5 C), temperature source Oral, resp. rate 16, height '5\' 6"'  (1.676 m), weight 158 lb 8 oz (71.895 kg), SpO2 100 %.Body mass index is 25.59 kg/(m^2).  General Appearance: Fairly Groomed  Eye Contact: improved   Speech:  Normal Rate  Volume:  Normal  Mood:  Less depressed, states feeling " a little better "  Affect:  Constricted, but does smile briefly at times   Thought Process:  Linear  Orientation:  Other:  fully alert and attentive   Thought Content:  denies hallucinations, no delusions at this time, not internally preoccupied   Suicidal Thoughts:   Intermittent passive suicidal ideations, denies plan or intention of hurting self or of suicide   Homicidal Thoughts:  No denies any homicidal ideations   Memory:  recent and remote grossly intact   Judgement: improving   Insight:  Improving   Psychomotor Activity:  Decreased- mild distal tremors , but no restlessness or agitation   Concentration:  Concentration: Good and Attention Span: Good  Recall:  Good  Fund of Knowledge:  Good  Language:  Good  Akathisia:  Negative  Handed:  Right  AIMS (if indicated):     Assets:  Desire for Improvement Resilience  ADL's:  Intact  Cognition:  WNL  Sleep:  Number of Hours: 6.25  Assessment - patient presents with some improvement compared to admission - although still depressed, reports he is starting to  feel better, and does present with improving range of affect. No suicidal plan or intention at this time, contracts for safety on unit, but still describes some passive SI. At this time not presenting  with significant alcohol WDL symptoms. BP improved compared to admission and today denies lightheadedness or dizziness . States Remeron did not work for insomnia, and is interested in trying Seroquel- we have discussed side effect profile .   Treatment Plan Summary: Daily contact with patient to assess and evaluate symptoms and progress in treatment, Medication management, Plan inpatient admission  and medications as below Encourage group, milieu participation, to work on coping skills and symptom reduction  Continue Ativan  PRNS for alcohol withdrawal symptoms as needed D/C REMERON  START SEROQUEL 25 mgrs QHS  Continue Lopressor  12.5 mgrs BID and Norvasc 2.5 mgrs QDAY  For HTN Treatment team working on disposition planning, states he is interested in going to a Rehab on discharge   Neita Garnet, MD 01/02/2016, 12:55 PM

## 2016-01-03 DIAGNOSIS — F1910 Other psychoactive substance abuse, uncomplicated: Secondary | ICD-10-CM

## 2016-01-03 DIAGNOSIS — F1420 Cocaine dependence, uncomplicated: Secondary | ICD-10-CM

## 2016-01-03 DIAGNOSIS — F332 Major depressive disorder, recurrent severe without psychotic features: Principal | ICD-10-CM

## 2016-01-03 MED ORDER — ONDANSETRON 4 MG PO TBDP
ORAL_TABLET | ORAL | Status: AC
  Filled 2016-01-03: qty 1

## 2016-01-03 NOTE — Progress Notes (Signed)
Patient ID: [name], male   DOB: [birth year], 35 y.o.   MRN: [id number] [facility] MD Progress Note  01/03/2016 10:20 AM Furious Chiarelli  MRN:  [id number] Subjective:  Patient reports he is feeling slightly better, more alert, slept fair with seroquel. Not on remeron now.  Mood still feels down but not worse Objective : I have discussed case with treatment team and have met with patient. Patient presents slightly improved compared to initial presentation. He presents more verbal, better engaged and better related, with improved rate of speech and eye contact. He still reports some depression and does continue to  Present constricted in affect, although he smiles at times appropriately. Denies medication side effects  No disruptive behaviors on unit, remains somewhat isolative - limited group attendance . Currently not presenting with significant symptoms of WDL - minimal distal tremors, no diaphoresis, no acute distress, vitals stable  Labs- follow up BMP within normal limits  Principal Problem: Depression, major, severe recurrence ([place]) Diagnosis:   Patient Active Problem List   Diagnosis Date Noted  . Severe episode of recurrent major depressive disorder, without psychotic features ([place]) [F33.2]   . Major depressive disorder, recurrent episode, severe, with psychotic behavior ([place]) [F33.3] 12/30/2015  . Depression, major, severe recurrence ([place]) [F33.2] 12/30/2015  . Substance induced mood disorder ([place]) [F19.94] 12/02/2015  . Mood disorder in conditions classified elsewhere [F06.30]   . MDD (major depressive disorder), recurrent severe, without psychosis ([place]) [F33.2] 10/08/2015  . Malnutrition of moderate degree [E44.0] 09/24/2015  . Alcohol withdrawal ([place]) [F10.239] 09/23/2015  . Tobacco use disorder [F17.200] 07/16/2015  . Elevated transaminase level [R74.0]   . Drug overdose, intentional ([place]) [T50.902A] 07/12/2015  . Alcohol use disorder, severe, dependence ([place]) [F10.20] 04/15/2015  .  Cocaine abuse with cocaine-induced mood disorder ([place]) [F14.14] 04/11/2015  . Overdose [T50.901A] 04/10/2015  . Severe recurrent major depressive disorder with psychotic features ([place]) [F33.3]   . Severe major depression with psychotic features ([place]) [F32.3] 09/11/2014  . Alcohol-induced mood disorder ([place]) [F10.94] 09/10/2014  . Suicidal ideation [R45.851]   . Tylenol overdose [T39.1X4A]   . Polysubstance abuse [F19.10]   . Overdose of acetaminophen [T39.1X4A] 08/03/2014  . Overdose by acetaminophen [T39.1X4A] 08/03/2014  . Cocaine abuse [F14.10] 04/16/2014  . Thrombocytopenia ([place]) [D69.6] 04/15/2014  . Urinary tract infection, site not specified [N39.0] 04/15/2014  . Chest pain, unspecified [R07.9] 04/15/2014  . D-dimer, elevated [R79.1] 04/15/2014  . Transaminitis [R74.0] 09/24/2013  . Cocaine dependence ([place]) [F14.20] 09/20/2013  . 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 Seizure ([place]) [R56.9] 03/15/2013  . Syncope [R55] 01/02/2013  . Leukocytopenia, unspecified [D72.819] 01/02/2013  . Left kidney mass [N28.89] 12/24/2012  . PTSD (post-traumatic stress disorder) [F43.10] 07/06/2012  . Peripheral vascular disease ([place]) [I73.9] 01/14/2012  . SEIZURE DISORDER [R56.9] 10/03/2008  . LIVER FUNCTION TESTS, ABNORMAL [R94.5] 12/29/2007  . HYPERCHOLESTEROLEMIA [E78.00] 03/21/2007  . Essential hypertension [I10] 03/21/2007   Total Time spent with patient: 20 minutes    Past Medical History:  Past Medical History  Diagnosis Date  . Seizures ([place])   . Hypertension   . Depression   . Pancreatitis   . Liver cirrhosis ([place])   . Coronary artery disease   . Angina   . Shortness of breath   . Headache(784.0)   . Peripheral vascular disease [facility]) April 2011    Left Pop  . Hypercholesteremia   . Schizophrenia ([place])   . Bipolar 1 disorder ([place])   . Cancer of kidney ([place]) dx'd 04/2013  lt nephrectomy  . Breast CA ([place]) dx'd 2009    bil w/ bil masectomy and oral meds  . H/O suicide  attempt 2015    overdose    Past Surgical History  Procedure Laterality Date  . Chest surgery    . Left leg surgery    . Mastectomy    . Left kidney removal    . Breast surgery     Family History:  Family History  Problem Relation Age of Onset  . Stroke Other   . Cancer Other   . Hyperlipidemia Mother   . Hypertension Mother     Social History:  History  Alcohol Use  . 0.0 oz/week     History  Drug Use No    Comment: former    Social History   Social History  . Marital Status: Single    Spouse Name: N/A  . Number of Children: N/A  . Years of Education: N/A   Social History Main Topics  . Smoking status: Current Every Day Smoker  . Smokeless tobacco: None  . Alcohol Use: 0.0 oz/week  . Drug Use: No     Comment: former  . Sexual Activity: Yes    Birth Control/ Protection: Condom     Comment: anal   Other Topics Concern  . None   Social History Narrative   ** Merged History Encounter **       Additional Social History:   Sleep: Fair  Appetite:  Improving   Current Medications: Current Facility-Administered Medications  Medication Dose Route Frequency Provider Last Rate Last Dose  . alum & mag hydroxide-simeth (MAALOX/MYLANTA) 200-200-20 MG/5ML suspension 30 mL  30 mL Oral Q4H PRN [name], NP      . amLODipine (NORVASC) tablet 2.5 mg  2.5 mg Oral Daily Jenne Campus, MD   2.5 mg at 01/03/16 0846  . atorvastatin (LIPITOR) tablet 20 mg  20 mg Oral Daily [name], NP   20 mg at 01/03/16 0838  . hydrOXYzine (ATARAX/VISTARIL) tablet 25 mg  25 mg Oral Q6H PRN [name], NP      . ibuprofen (ADVIL,MOTRIN) tablet 600 mg  600 mg Oral Q8H PRN [name], NP   600 mg at 12/31/15 1617  . LORazepam (ATIVAN) tablet 1 mg  1 mg Oral Q6H PRN [name], NP   1 mg at 01/03/16 0216  . magnesium hydroxide (MILK OF MAGNESIA) suspension 30 mL  30 mL Oral Daily PRN [name], NP      . metoprolol tartrate (LOPRESSOR) tablet 12.5 mg  12.5 mg  Oral BID Jenne Campus, MD   12.5 mg at 01/03/16 0960  . ondansetron (ZOFRAN) tablet 4 mg  4 mg Oral Q8H PRN [name], NP   4 mg at 01/03/16 0153  . QUEtiapine (SEROQUEL) tablet 25 mg  25 mg Oral QHS Jenne Campus, MD   25 mg at 01/02/16 2121    Lab Results:  Results for orders placed or performed during the hospital encounter of 12/30/15 (from the past 48 hour(s))  Basic metabolic panel     Status: None   Collection Time: 01/02/16  6:08 AM  Result Value Ref Range   Sodium 138 135 - 145 mmol/L   Potassium 3.8 3.5 - 5.1 mmol/L   Chloride 105 101 - 111 mmol/L   CO2 25 22 - 32 mmol/L   Glucose, Bld 77 65 - 99 mg/dL   BUN 12  6 - 20 mg/dL   Creatinine, Ser 1.18 0.61 - 1.24 mg/dL   Calcium 9.0 8.9 - 10.3 mg/dL   GFR calc non Af Amer >60 >60 mL/min   GFR calc Af Amer >60 >60 mL/min    Comment: (NOTE) The eGFR has been calculated using the CKD EPI equation. This calculation has not been validated in all clinical situations. eGFR's persistently <60 mL/min signify possible Chronic Kidney Disease.    Anion gap 8 5 - 15    Comment: Performed at [facility]    Blood Alcohol level:  Lab Results  Component Value Date   [facility] 129* 12/29/2015   ETH <5 11/29/2015    Physical Findings: AIMS: Facial and Oral Movements Muscles of Facial Expression: None, normal Lips and Perioral Area: None, normal Jaw: None, normal Tongue: None, normal,Extremity Movements Upper (arms, wrists, hands, fingers): None, normal Lower (legs, knees, ankles, toes): None, normal, Trunk Movements Neck, shoulders, hips: None, normal, Overall Severity Severity of abnormal movements (highest score from questions above): None, normal Incapacitation due to abnormal movements: None, normal [name] awareness of abnormal movements (rate only patient's report): No Awareness, Dental Status Current problems with teeth and/or dentures?: No Does patient usually wear dentures?: No  CIWA:  CIWA-Ar  Total: 6 COWS:     Musculoskeletal: Strength & Muscle Tone: within normal limits- mild distal tremors  Gait & Station: normal Patient leans: N/A  Psychiatric Specialty Exam: Physical Exam  Review of Systems  Neurological: Negative for tremors.  Psychiatric/Behavioral: Negative for suicidal ideas.   denies headache, denies chest pain , no shortness of breath , no vomiting  Blood pressure 123/92, pulse 88, temperature 98.7 F (37.1 C), temperature source Oral, resp. rate 16, height '5\' 6"'  (1.676 m), weight 71.895 kg (158 lb 8 oz), SpO2 100 %.Body mass index is 25.59 kg/(m^2).  General Appearance: Fairly Groomed  Eye Contact: improved   Speech:  Normal Rate  Volume:  Normal  Mood:  Less depresse  Affect:  Constricted,   Thought Process:  Linear  Orientation:  Other:  fully alert and attentive   Thought Content:  denies hallucinations, no delusions at this time, not internally preoccupied   Suicidal Thoughts:   Intermittent passive suicidal ideations, denies plan or intention of hurting self or of suicide   Homicidal Thoughts:  No denies any homicidal ideations   Memory:  recent and remote grossly intact   Judgement: improving   Insight:  Improving   Psychomotor Activity:  Decreased- mild distal tremors , but no restlessness or agitation   Concentration:  Concentration: Good and Attention Span: Good  Recall:  Good  Fund of Knowledge:  Good  Language:  Good  Akathisia:  Negative  Handed:  Right  AIMS (if indicated):     Assets:  Desire for Improvement Resilience  ADL's:  Intact  Cognition:  WNL  Sleep:  Number of Hours: 2.75  Assessment - better sleep . Mood still down but not hopeless. Treatment Plan Summary: Daily contact with patient to assess and evaluate symptoms and progress in treatment, Medication management, Plan inpatient admission  and medications as below Encourage group, milieu participation, to work on coping skills and symptom reduction  Continue Ativan  PRNS  for alcohol withdrawal symptoms as needed  Continue  SEROQUEL 25 mgrs QHS  Continue Lopressor  12.5 mgrs BID and Norvasc 2.5 mgrs QDAY  For HTN Treatment team working on disposition planning, states he is interested in going to a Rehab on discharge  Merian Capron, MD 01/03/2016, 10:20 AM

## 2016-01-03 NOTE — [facility] Group Notes (Signed)
Adult Therapy Group Note  Date:  01/03/2016 Time:  10:00 AM  Group Topic/Focus:  Today's group focused on identifying a change each patient is considering, their goals which they would want to accomplish with that change, possible obstacles and how they could respond to those obstacles.  We then talked about how they will know when that change is starting to happen.  Motivational Interviewing was used to high light ambivalence and focus on patients' own reasons to change.  Participation Level:  Active  Participation Quality:  Appropriate, Attentive and Sharing  Affect:  Anxious and Blunted  Cognitive:  Appropriate  Insight: Improving  Engagement in Group:  Developing/Improving  Modes of Intervention:  Exploration and Motivational Interviewing  Additional Comments:  Pt stated that he needs to change the environment in his head, that he realizes he does not necessary need to change where he lives, because "me, myself and I go with me wherever I go."  He stated that today one thing he can to is stay on his medications.  Lysle Dingwall 01/03/2016, 11:18 AM

## 2016-01-03 NOTE — Progress Notes (Signed)
D: Patient denies HI and A/V hallucinations; patient reports on and off thoughts of SI; patient reports sleep is poor; reports appetite is fair; reports energy level is normal ; reports ability to concentration is good; rates depression as 2/10; rates hopelessness 2/10; rates anxiety as 2/10;   A: Monitored q 15 minutes; patient encouraged to attend groups; patient educated about medications; patient given medications per physician orders; patient encouraged to express feelings and/or concerns  R: Patient has slept most of the day; patient has been minimal when he came out this afternoon; patient has been reading among his peers;  patient was able to set goal to talk with staff 1:1 when having feelings of SI; patient is taking medications as prescribed and tolerating medications; patient is attending all groups

## 2016-01-03 NOTE — Progress Notes (Signed)
Patient ID: [name], male   DOB: Jul 27, 1980, 35 y.o.   MRN: [id number] D: Patient reports vomiting at beginning of shift but reports relief without medication. Pt reports feeling tired and has been in bed all evening. Pt reports appetit is poor. Pt endorses SI without command, verbal contract to come to staff. denise HI/AVH and pain. A: Support and encouragement offered as needed. Medications administered as prescribed.  R: Patient safe and cooperative on unit. Will continue to monitor patient for safety and stability.

## 2016-01-03 NOTE — Progress Notes (Signed)
Patient did not attend the evening speaker [place] meeting. Pt was notified that group was beginning but pt remained in bed.

## 2016-01-04 ENCOUNTER — Encounter (HOSPITAL_COMMUNITY): Payer: Self-pay | Admitting: Registered Nurse

## 2016-01-04 DIAGNOSIS — I10 Essential (primary) hypertension: Secondary | ICD-10-CM | POA: Insufficient documentation

## 2016-01-04 DIAGNOSIS — F1424 Cocaine dependence with cocaine-induced mood disorder: Secondary | ICD-10-CM

## 2016-01-04 DIAGNOSIS — G4700 Insomnia, unspecified: Secondary | ICD-10-CM
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
## 2016-01-04 MED ORDER — AMLODIPINE BESYLATE 5 MG PO TABS
5.0000 mg | ORAL_TABLET | Freq: Every day | ORAL | Status: DC
  Administered 2016-01-05: 5 mg via ORAL
  Filled 2016-01-04 (×2): qty 1
  Filled 2016-01-04: qty 14

## 2016-01-04 MED ORDER — QUETIAPINE FUMARATE 50 MG PO TABS
25.0000 mg | ORAL_TABLET | Freq: Two times a day (BID) | ORAL | Status: DC
  Administered 2016-01-04 – 2016-01-05 (×2): 25 mg via ORAL
  Filled 2016-01-04: qty 1
  Filled 2016-01-04: qty 28
  Filled 2016-01-04 (×3): qty 1
  Filled 2016-01-04: qty 28

## 2016-01-04 MED ORDER — AMLODIPINE BESYLATE 2.5 MG PO TABS
2.5000 mg | ORAL_TABLET | Freq: Once | ORAL | Status: AC
  Administered 2016-01-04: 2.5 mg via ORAL
  Filled 2016-01-04: qty 1

## 2016-01-04 MED ORDER — QUETIAPINE FUMARATE 50 MG PO TABS
50.0000 mg | ORAL_TABLET | Freq: Every day | ORAL | Status: DC
  Administered 2016-01-04: 50 mg via ORAL
  Filled 2016-01-04 (×3): qty 1

## 2016-01-04 NOTE — Progress Notes (Signed)
Patient ID: [name], male   DOB: 02/13/1981, 35 y.o.   MRN: [id number] [facility] MD Progress Note  01/04/2016 3:23 PM Garret Mere  MRN:  [id number]    Subjective:  "I've been up since 12 AM; I haven't had any sleep"   Objective :Patient seen by this provider and chart reviewed 01/04/2016.  On evaluation:  Fallon Salaiz reports that he has had no sleep and that the Seroquel is not helping; Reports anxiety and agitation related to withdrawal.  States that he is tolerating medications without adverse reaction.  Eating without difficulty; and attending and participating in group sessions. Complaints of slight headache but could be related to increased blood pressure.  At this time denies suicidal/homicidal ideation, psychosis, and paranoia.  Discussed making changes to Seroquel and Norvasc related to agitation, anxiety, and blood pressure   Principal Problem: Depression, major, severe recurrence ([place]) Diagnosis:   Patient Active Problem List   Diagnosis Date Noted  . Severe episode of recurrent major depressive disorder, without psychotic features ([place]) [F33.2]   . Major depressive disorder, recurrent episode, severe, with psychotic behavior ([place]) [F33.3] 12/30/2015  . Depression, major, severe recurrence ([place]) [F33.2] 12/30/2015  . Substance induced mood disorder ([place]) [F19.94] 12/02/2015  . Mood disorder in conditions classified elsewhere [F06.30]   . MDD (major depressive disorder), recurrent severe, without psychosis ([place]) [F33.2] 10/08/2015  . Malnutrition of moderate degree [E44.0] 09/24/2015  . Alcohol withdrawal ([place]) [F10.239] 09/23/2015  . Tobacco use disorder [F17.200] 07/16/2015  . Elevated transaminase level [R74.0]   . Drug overdose, intentional ([place]) [T50.902A] 07/12/2015  . Alcohol use disorder, severe, dependence ([place]) [F10.20] 04/15/2015  . Cocaine abuse with cocaine-induced mood disorder ([place]) [F14.14] 04/11/2015  . Overdose [T50.901A] 04/10/2015  . Severe recurrent major  depressive disorder with psychotic features ([place]) [F33.3]   . Severe major depression with psychotic features ([place]) [F32.3] 09/11/2014  . Alcohol-induced mood disorder ([place]) [F10.94] 09/10/2014  . Suicidal ideation [R45.851]   . Tylenol overdose [T39.1X4A]   . Polysubstance abuse [F19.10]   . Overdose of acetaminophen [T39.1X4A] 08/03/2014  . Overdose by acetaminophen [T39.1X4A] 08/03/2014  . Cocaine abuse [F14.10] 04/16/2014  . Thrombocytopenia ([place]) [D69.6] 04/15/2014  . Urinary tract infection, site not specified [N39.0] 04/15/2014  . Chest pain, unspecified [R07.9] 04/15/2014  . D-dimer, elevated [R79.1] 04/15/2014  . Transaminitis [R74.0] 09/24/2013  . Cocaine dependence ([place]) [F14.20] 09/20/2013  . 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 Seizure ([place]) [R56.9] 03/15/2013  . Syncope [R55] 01/02/2013  . Leukocytopenia, unspecified [D72.819] 01/02/2013  . Left kidney mass [N28.89] 12/24/2012  . PTSD (post-traumatic stress disorder) [F43.10] 07/06/2012  . Peripheral vascular disease ([place]) [I73.9] 01/14/2012  . SEIZURE DISORDER [R56.9] 10/03/2008  . LIVER FUNCTION TESTS, ABNORMAL [R94.5] 12/29/2007  . HYPERCHOLESTEROLEMIA [E78.00] 03/21/2007  . Essential hypertension [I10] 03/21/2007   Total Time spent with patient: 25 minutes    Past Medical History:  Past Medical History  Diagnosis Date  . Seizures ([place])   . Hypertension   . Depression   . Pancreatitis   . Liver cirrhosis ([place])   . Coronary artery disease   . Angina   . Shortness of breath   . Headache(784.0)   . Peripheral vascular disease [facility]) April 2011    Left Pop  . Hypercholesteremia   . Schizophrenia ([place])   . Bipolar 1 disorder ([place])   . Cancer of kidney ([place]) dx'd 04/2013    lt nephrectomy  . Breast CA ([place]) dx'd 2009    bil w/ bil masectomy  and oral meds  . H/O suicide attempt 2015    overdose    Past Surgical History  Procedure Laterality Date  . Chest surgery    . Left leg surgery    .  Mastectomy    . Left kidney removal    . Breast surgery     Family History:  Family History  Problem Relation Age of Onset  . Stroke Other   . Cancer Other   . Hyperlipidemia Mother   . Hypertension Mother     Social History:  History  Alcohol Use  . 0.0 oz/week     History  Drug Use No    Comment: former    Social History   Social History  . Marital Status: Single    Spouse Name: N/A  . Number of Children: N/A  . Years of Education: N/A   Social History Main Topics  . Smoking status: Current Every Day Smoker  . Smokeless tobacco: None  . Alcohol Use: 0.0 oz/week  . Drug Use: No     Comment: former  . Sexual Activity: Yes    Birth Control/ Protection: Condom     Comment: anal   Other Topics Concern  . None   Social History Narrative   ** Merged History Encounter **       Additional Social History:   Sleep: Fair  Appetite:  Improving   Current Medications: Current Facility-Administered Medications  Medication Dose Route Frequency Provider Last Rate Last Dose  . alum & mag hydroxide-simeth (MAALOX/MYLANTA) 200-200-20 MG/5ML suspension 30 mL  30 mL Oral Q4H PRN [name], NP      . amLODipine (NORVASC) tablet 2.5 mg  2.5 mg Oral Once Shuvon B Rankin, NP      . Derrill Memo ON 01/05/2016] amLODipine (NORVASC) tablet 5 mg  5 mg Oral Daily Shuvon B Rankin, NP      . atorvastatin (LIPITOR) tablet 20 mg  20 mg Oral Daily [name], NP   20 mg at 01/04/16 0831  . hydrOXYzine (ATARAX/VISTARIL) tablet 25 mg  25 mg Oral Q6H PRN [name], NP   25 mg at 01/04/16 0202  . ibuprofen (ADVIL,MOTRIN) tablet 600 mg  600 mg Oral Q8H PRN [name], NP   600 mg at 12/31/15 1617  . LORazepam (ATIVAN) tablet 1 mg  1 mg Oral Q6H PRN [name], NP   1 mg at 01/03/16 0216  . magnesium hydroxide (MILK OF MAGNESIA) suspension 30 mL  30 mL Oral Daily PRN [name], NP      . metoprolol tartrate (LOPRESSOR) tablet 12.5 mg  12.5 mg Oral BID Jenne Campus, MD    12.5 mg at 01/04/16 0831  . ondansetron (ZOFRAN) tablet 4 mg  4 mg Oral Q8H PRN [name], NP   4 mg at 01/03/16 0153  . QUEtiapine (SEROQUEL) tablet 25 mg  25 mg Oral BID Shuvon B Rankin, NP      . QUEtiapine (SEROQUEL) tablet 50 mg  50 mg Oral QHS Shuvon B Rankin, NP        Lab Results:  No results found for this or any previous visit (from the past 48 hour(s)).  Blood Alcohol level:  Lab Results  Component Value Date   [facility] 129* 12/29/2015   ETH <5 11/29/2015    Physical Findings: AIMS: Facial and Oral Movements Muscles of Facial Expression: None, normal Lips and Perioral Area: None, normal Jaw: None, normal Tongue:  None, normal,Extremity Movements Upper (arms, wrists, hands, fingers): None, normal Lower (legs, knees, ankles, toes): None, normal, Trunk Movements Neck, shoulders, hips: None, normal, Overall Severity Severity of abnormal movements (highest score from questions above): None, normal Incapacitation due to abnormal movements: None, normal [name] awareness of abnormal movements (rate only patient's report): No Awareness, Dental Status Current problems with teeth and/or dentures?: No Does patient usually wear dentures?: No  CIWA:  CIWA-Ar Total: 6 COWS:     Musculoskeletal: Strength & Muscle Tone: within normal limits- mild distal tremors  Gait & Station: normal Patient leans: N/A  Psychiatric Specialty Exam: Physical Exam  Constitutional: He is oriented to person, place, and time.  Neck: Normal range of motion.  Respiratory: Effort normal.  Musculoskeletal: Normal range of motion.  Neurological: He is alert and oriented to person, place, and time.  Skin: Skin is warm and dry.    Review of Systems  Neurological: Negative for tremors.  Psychiatric/Behavioral: Positive for depression and substance abuse. Negative for suicidal ideas and hallucinations. The patient is nervous/anxious and has insomnia.   All other systems reviewed and are negative.     Blood pressure 125/96, pulse 98, temperature 98.8 F (37.1 C), temperature source Oral, resp. rate 16, height 5\' 6"  (1.676 m), weight 71.895 kg (158 lb 8 oz), SpO2 100 %.Body mass index is 25.59 kg/(m^2).  General Appearance: Fairly Groomed  Eye Contact: improved   Speech:  Normal Rate  Volume:  Normal  Mood:  Less depressed  Affect:  Congruent,   Thought Process:  Linear  Orientation:  Other:  fully alert and attentive   Thought Content:  denies hallucinations, no delusions at this time, not internally preoccupied   Suicidal Thoughts:  No   Homicidal Thoughts:  No   Memory:  recent and remote grossly intact   Judgement: improving   Insight:  Improving   Psychomotor Activity:  Normal  Concentration:  Concentration: Good and Attention Span: Good  Recall:  Good  Fund of Knowledge:  Good  Language:  Good  Akathisia:  Negative  Handed:  Right  AIMS (if indicated):     Assets:  Desire for Improvement Resilience  ADL's:  Intact  Cognition:  WNL  Sleep:  Number of Hours: 2.25  Assessment - better sleep . Mood still down but not hopeless. Treatment Plan Summary: Daily contact with patient to assess and evaluate symptoms and progress in treatment, Medication management, Plan inpatient admission  and medications as below Encourage group, milieu participation, to work on coping skills and symptom reduction  Continue Ativan  PRNS for alcohol withdrawal symptoms as needed Increased Seroquel 25 mgrs bid and 50 mg Q hs for anxiety/mood stabilization/Insomnia   Continue Lopressor  12.5 mgrs BID and Increased Norvasc 5 mgrs QDAY  For HTN Treatment team working on disposition planning, states he is interested in going to a Rehab on discharge   Rankin, [place], NP 01/04/2016, 3:23 PM I agreed with findings and treatment plan of this patient

## 2016-01-04 NOTE — BHH Group Notes (Signed)
[facility] LCSW Group Therapy  01/04/2016 10:10 until 11 AM  Type of Therapy:  Group Therapy  Participation Level:  Did Not Attend Despite MHT Prompting and Speaker Announcement of Group Time and Place.     Sheilah Pigeon, LCSW  01/04/2016

## 2016-01-04 NOTE — Plan of Care (Signed)
Problem: Medication: Goal: Compliance with prescribed medication regimen will improve Outcome: Progressing Pt denies SI and has been compliant with medication

## 2016-01-04 NOTE — Progress Notes (Signed)
Patient ID: [name], male   DOB: [birth year], 36 y.o.   MRN: [id number] D: Patient in room on approach. Pt mood and affect appeared depressed and flat. Pt out of room this evening talking on phone. Pt reports he is tolerating medication well. Pt denies SI/HI/AVH and pain. Pt reports plans to move in with parents while awaiting bed at [facility] A:  Medications administered as prescribed. Support and encouragement provided to attend groups and engage in milieu. Pt encouraged to discuss feelings and come to staff with any question or concerns.  R: Patient remains safe and complaint with medications.

## 2016-01-04 NOTE — Progress Notes (Signed)
Patient did not attend the evening speaker AA meeting. Pt was notified that group was beginning but remained in bed.   

## 2016-01-04 NOTE — Progress Notes (Signed)
D: Awake in bed beginning of shift, denied physical complaints.  Affect sad and blunted, mood "happy."  Patient did not attend AA group.  C/O insomnia after scheduled Seroquel r/t some anxiety.  Patient reports normally awake during night hours, states "I am nocturnal," he did sleep much of the day yesterday.   Denies SI, HI, and AVH.   A: PRN Vistaril given due to anxiety/insomnia.  Remains of 15 minute checks for safety. R: Vistaril effective for anxiety but not sleep.  Patient awake through night, came do desk to at times request books and material to read.  Remains safe on unit.

## 2016-01-04 NOTE — Progress Notes (Signed)
NSG 7a-7p shift:   D:  Pt. Has been brighter towards the end of this shift, stating that he was finally able to get some sleep.  He talked about his struggles with breast and kidney cancer and not having money to pay for one of his surgeries.  "Some surgeons at [facility] finally did the surgery for me pro bono".  He had also lost his job which impaired his self-esteem.  A: Support, education, and encouragement provided as needed.  Level 3 checks continued for safety.  R: Pt. receptive to intervention/s.  Safety maintained.  Prudencio Pair, RN

## 2016-01-05 MED ORDER — HYDROXYZINE HCL 25 MG PO TABS: 25.0000 mg | ORAL_TABLET | Freq: Four times a day (QID) | ORAL | Status: DC | PRN

## 2016-01-05 MED ORDER — QUETIAPINE FUMARATE 50 MG PO TABS: 50.0000 mg | ORAL_TABLET | Freq: Every day | ORAL | Status: DC

## 2016-01-05 MED ORDER — AMLODIPINE BESYLATE 5 MG PO TABS: 5.0000 mg | ORAL_TABLET | Freq: Every day | ORAL | Status: DC

## 2016-01-05 MED ORDER — QUETIAPINE FUMARATE 25 MG PO TABS: 25.0000 mg | ORAL_TABLET | Freq: Two times a day (BID) | ORAL | Status: DC

## 2016-01-05 MED ORDER — ATORVASTATIN CALCIUM 20 MG PO TABS: 20.0000 mg | ORAL_TABLET | Freq: Every day | ORAL | Status: DC

## 2016-01-05 MED ORDER — METOPROLOL TARTRATE 25 MG PO TABS: 12.5000 mg | ORAL_TABLET | Freq: Two times a day (BID) | ORAL | Status: DC

## 2016-01-05 NOTE — BHH Suicide Risk Assessment (Signed)
[facility] Discharge Suicide Risk Assessment   Principal Problem: Depression, major, severe recurrence [facility]) Discharge Diagnoses:  Patient Active Problem List   Diagnosis Date Noted  . Insomnia [G47.00]   . HTN (hypertension), benign [I10]   . Severe episode of recurrent major depressive disorder, without psychotic features ([place]) [F33.2]   . Major depressive disorder, recurrent episode, severe, with psychotic behavior ([place]) [F33.3] 12/30/2015  . Depression, major, severe recurrence ([place]) [F33.2] 12/30/2015  . Substance induced mood disorder ([place]) [F19.94] 12/02/2015  . Mood disorder in conditions classified elsewhere [F06.30]   . MDD (major depressive disorder), recurrent severe, without psychosis ([place]) [F33.2] 10/08/2015  . Malnutrition of moderate degree [E44.0] 09/24/2015  . Alcohol withdrawal ([place]) [F10.239] 09/23/2015  . Tobacco use disorder [F17.200] 07/16/2015  . Elevated transaminase level [R74.0]   . Drug overdose, intentional ([place]) [T50.902A] 07/12/2015  . Alcohol use disorder, severe, dependence ([place]) [F10.20] 04/15/2015  . Cocaine abuse with cocaine-induced mood disorder ([place]) [F14.14] 04/11/2015  . Overdose [T50.901A] 04/10/2015  . Severe recurrent major depressive disorder with psychotic features ([employer]) [F33.3]   . Severe major depression with psychotic features ([place]) [F32.3] 09/11/2014  . Alcohol-induced mood disorder ([place]) [F10.94] 09/10/2014  . Suicidal ideation [R45.851]   . Tylenol overdose [T39.1X4A]   . Polysubstance abuse [F19.10]   . Overdose of acetaminophen [T39.1X4A] 08/03/2014  . Overdose by acetaminophen [T39.1X4A] 08/03/2014  . Cocaine abuse [F14.10] 04/16/2014  . Thrombocytopenia ([place]) [D69.6] 04/15/2014  . Urinary tract infection, site not specified [N39.0] 04/15/2014  . Chest pain, unspecified [R07.9] 04/15/2014  . D-dimer, elevated [R79.1] 04/15/2014  . Transaminitis [R74.0] 09/24/2013  . Cocaine dependence ([place]) [F14.20] 09/20/2013  . S/p nephrectomy  [Z90.5] 04/28/2013  . Seizure ([place]) [R56.9] 03/15/2013  . Syncope [R55] 01/02/2013  . Leukocytopenia, unspecified [D72.819] 01/02/2013  . Left kidney mass [N28.89] 12/24/2012  . PTSD (post-traumatic stress disorder) [F43.10] 07/06/2012  . Peripheral vascular disease ([place]) [I73.9] 01/14/2012  . SEIZURE DISORDER [R56.9] 10/03/2008  . LIVER FUNCTION TESTS, ABNORMAL [R94.5] 12/29/2007  . HYPERCHOLESTEROLEMIA [E78.00] 03/21/2007  . Essential hypertension [I10] 03/21/2007    Total Time spent with patient: 15 minutes  Musculoskeletal: Strength & Muscle Tone: within normal limits Gait & Station: normal Patient leans: N/A  Psychiatric Specialty Exam: ROS  Blood pressure 115/78, pulse 72, temperature 98.4 F (36.9 C), temperature source Oral, resp. rate 16, height 5\' 6"  (1.676 m), weight 158 lb 8 oz (71.895 kg), SpO2 100 %.Body mass index is 25.59 kg/(m^2).  General Appearance: Casual  Eye Contact::  Fair  Speech:  Clear and [id number]  Volume:  Normal  Mood:  Euthymic  Affect:  Congruent  Thought Process:  Coherent  Orientation:  Full (Time, Place, and Person)  Thought Content:  WDL  Suicidal Thoughts:  No  Homicidal Thoughts:  No  Memory:  Immediate;   Fair Recent;   Fair Remote;   Fair  Judgement:  Fair  Insight:  Fair  Psychomotor Activity:  Normal  Concentration:  Fair  Recall:  [employer] of Knowledge:Fair  Language: Fair  Akathisia:  No  Handed:  Right  AIMS (if indicated):     Assets:  Communication Skills Desire for Improvement Physical Health Resilience Social Support  Sleep:  Number of Hours: 0  Cognition: WNL  ADL's:  Intact   Mental Status Per Nursing Assessment::   On Admission:  Suicidal ideation indicated by patient, Suicidal ideation indicated by others, Suicide plan, Plan includes specific time, place, or method, Self-harm thoughts, Intention to act on suicide  plan, Belief that plan would result in death  Demographic Factors:  Male  Loss  Factors: Decrease in vocational status and Financial problems/change in socioeconomic status  Historical Factors: Family history of mental illness or substance abuse  Risk Reduction Factors:   Positive social support and Positive coping skills or problem solving skills  Continued Clinical Symptoms:  Improved mood.  Cognitive Features That Contribute To Risk:  None    Suicide Risk:  Minimal: No identifiable suicidal ideation.  Patients presenting with no risk factors but with morbid ruminations; may be classified as minimal risk based on the severity of the depressive symptoms  Follow-up Information    Follow up with [facility].   Why:  Walk in hours between 8am-9am Monday through Friday for hospital follow-up/medication management/counseling services.    Contact information:   [address] [place], [place] [address] Phone: [phone] Fax: [phone]      Follow up with [facility] On 01/06/2016.   Why:  Assessment for possible admission on Tuesday June 20th at [place]. ID, 2 week supply, & clothing in case of admission.    Contact information:   [address] [place], [place] [address] Phone: [phone] Fax: [phone]      Plan Of Care/Follow-up recommendations:  Activity:  Normal Diet:  Normal  Continue medications per discharge instructions. Follow up with residential substance abuse treatment at [facility]. Patient aware of safety plan if he has suicidal thoughts.  Elvin So, MD 01/05/2016, 10:16 AM

## 2016-01-05 NOTE — Progress Notes (Signed)
Patient ID: [name], male   DOB: [birth year], 35 y.o.   MRN: [id number]  Pt. Denies SI/HI and A/V hallucinations. Belongings returned to patient at time of discharge. Patient denies any pain or discomfort. Discharge instructions and medications were reviewed with patient. Patient verbalized understanding of both medications and discharge instructions. Q15 minute safety checks maintained until discharge. Patient discharged to lobby where his father picked him up. No distress upon discharge. He reports he is feeling ready to go.

## 2016-01-05 NOTE — BHH Suicide Risk Assessment (Signed)
[place] INPATIENT:  Family/Significant Other Suicide Prevention Education  Suicide Prevention Education:  Patient Refusal for Family/Significant Other Suicide Prevention Education: The patient [name] has refused to provide written consent for family/significant other to be provided Family/Significant Other Suicide Prevention Education during admission and/or prior to discharge.  Physician notified. SPE reviewed with patient and brochure provided. Patient encouraged to return to hospital if having suicidal thoughts, patient verbalized his/her understanding and has no further questions at this time.   Clifton Kovacic, Casimiro Needle 01/05/2016, 9:28 AM

## 2016-01-05 NOTE — Tx Team (Signed)
Interdisciplinary Treatment Plan Update (Adult) Date: 01/05/2016    Time Reviewed: 9:30 AM  Progress in Treatment: Attending groups: Intermittently Participating in groups: Yes, when he attends Taking medication as prescribed: Yes Tolerating medication: Yes Family/Significant other contact made: No, patient declines collateral contact at this time Patient understands diagnosis: Yes Discussing patient identified problems/goals with staff: Yes Medical problems stabilized or resolved: Yes Denies suicidal/homicidal ideation: Yes Issues/concerns per patient self-inventory: Yes Other:  New problem(s) identified: N/A  Discharge Plan or Barriers: Patient plans to stay with parents overnight to await [facility] screening tomorrow 01/06/16.  Reason for Continuation of Hospitalization:  Depression Anxiety Medication Stabilization   Comments: N/A  Estimated length of stay: Discharge anticipated for today 01/05/16    Patient is a 35 year old male who presented to the hospital with SI attempt by overdosing on medications mixed with alcohol. Pt reports primary trigger(s) for admission was PTSD from past abuse, chronic pain, and feeling like his medication are not working. Patient with multiple SI attempts in the past and multiple previous admissions to [facility], last in May 2017. Patient will benefit from crisis stabilization, medication evaluation, group therapy and psycho education in addition to case management for discharge planning. At discharge, it is recommended that Pt remain compliant with established discharge plan and continued treatment.   Review of initial/current patient goals per problem list:  1. Goal(s): Patient will participate in aftercare plan   Met: Yes   Target date: 3-5 days post admission date   As evidenced by: Patient will participate within aftercare plan AEB aftercare provider and housing plan at discharge being identified.  6/14: Goal not met: CSW assessing for  appropriate referrals for pt and will have follow up secured prior to d/c.  6/19: Goal met. Patient plans to [facility] assessment.     2. Goal (s): Patient will exhibit decreased depressive symptoms and suicidal ideations.   Met: Adequate for discharge per MD   Target date: 3-5 days post admission date   As evidenced by: Patient will utilize self rating of depression at 3 or below and demonstrate decreased signs of depression or be deemed stable for discharge by MD.   6/14: Goal not met: Pt presents with flat affect and depressed mood.  Pt admitted with depression rating of 10.  Pt to show decreased sign of depression and a rating of 3 or less before d/c.    6/19: Adequate for discharge per MD. Patient reports feeling safe for discharge today, denies SI.    4. Goal(s): Patient will demonstrate decreased signs of withdrawal due to substance abuse   Met: Yes   Target date: 3-5 days post admission date   As evidenced by: Patient will produce a CIWA/COWS score of 0, have stable vitals signs, and no symptoms of withdrawal  6/13: Goal progressing. Patient with CIWA of 6, experiencing sweating, headache, and anxiety.  6/19: Goal met. No withdrawal symptoms reported at this time per medical chart.    Attendees:  Patient:    Family:    Physician: Dr. Shea Evans, Dr. Einar Grad, MD  01/05/2016   Nursing: Gaylan Gerold, RN 01/05/2016   Clinical Social Worker: Erasmo Downer Amethyst Gainer LCSW, Alvina Chou Smart LCSW 01/05/2016   Other:    Clinical: May Malachi Carl, NP 01/05/2016   Other:    Other:             Scribe for Treatment Team:  Tilden Fossa, [place]

## 2016-01-05 NOTE — Progress Notes (Signed)
  [facility] Adult Case Management Discharge Plan :  Will you be returning to the same living situation after discharge:  Yes,  patient plans to stay with parents overnight to await [facility] screening on 01/06/16 At discharge, do you have transportation home?: Yes,  father Do you have the ability to pay for your medications: Yes,  patient will be provided with prescriptions at discharge  Release of information consent forms completed and in the chart;  [name] signature needed at discharge.  Patient to Follow up at: Follow-up Information    Follow up with [facility].   Why:  Walk in hours between 8am-9am Monday through Friday for hospital follow-up/medication management/counseling services.    Contact information:   [address], [place] [address] Phone: [phone] Fax: [phone]      Follow up with [facility] On 01/06/2016.   Why:  Assessment for possible admission on Tuesday June 20th at [place]. ID, 2 week supply, & clothing in case of admission.    Contact information:   [address] [place], [place] [address] Phone: [phone] Fax: [phone]      Next level of care provider has access to [place] and Suicide Prevention discussed: Yes,  with patient   Have you used any form of tobacco in the last 30 days? (Cigarettes, Smokeless Tobacco, Cigars, and/or Pipes): Yes  Has patient been referred to the Quitline?: Patient refused referral  Patient has been referred for addiction treatment: Yes  Jream Broyles, Casimiro Needle 01/05/2016, 9:29 AM

## 2016-01-05 NOTE — Discharge Summary (Signed)
Physician Discharge Summary Note  Patient:  [name] is an 35 y.o., male MRN:  [id number] DOB:  05-Aug-1980 Patient phone:  [phone] (home)  Patient address:   [address] [place] [address],  Total Time spent with patient: 30 minutes  Date of Admission:  12/30/2015 Date of Discharge: 01/05/2016  Reason for Admission:  Depression, drug relapse  Principal Problem: Depression, major, severe recurrence [facility]) Discharge Diagnoses: Patient Active Problem List   Diagnosis Date Noted  . Insomnia [G47.00]   . HTN (hypertension), benign [I10]   . Severe episode of recurrent major depressive disorder, without psychotic features ([place]) [F33.2]   . Major depressive disorder, recurrent episode, severe, with psychotic behavior ([place]) [F33.3] 12/30/2015  . Depression, major, severe recurrence ([place]) [F33.2] 12/30/2015  . Substance induced mood disorder ([place]) [F19.94] 12/02/2015  . Mood disorder in conditions classified elsewhere [F06.30]   . MDD (major depressive disorder), recurrent severe, without psychosis ([place]) [F33.2] 10/08/2015  . Malnutrition of moderate degree [E44.0] 09/24/2015  . Alcohol withdrawal ([place]) [F10.239] 09/23/2015  . Tobacco use disorder [F17.200] 07/16/2015  . Elevated transaminase level [R74.0]   . Drug overdose, intentional ([place]) [T50.902A] 07/12/2015  . Alcohol use disorder, severe, dependence ([place]) [F10.20] 04/15/2015  . Cocaine abuse with cocaine-induced mood disorder ([place]) [F14.14] 04/11/2015  . Overdose [T50.901A] 04/10/2015  . Severe recurrent major depressive disorder with psychotic features ([place]) [F33.3]   . Severe major depression with psychotic features ([place]) [F32.3] 09/11/2014  . Alcohol-induced mood disorder ([place]) [F10.94] 09/10/2014  . Suicidal ideation [R45.851]   . Tylenol overdose [T39.1X4A]   . Polysubstance abuse [F19.10]   . Overdose of acetaminophen [T39.1X4A] 08/03/2014  . Overdose by acetaminophen [T39.1X4A] 08/03/2014  . Cocaine  abuse [F14.10] 04/16/2014  . Thrombocytopenia ([place]) [D69.6] 04/15/2014  . Urinary tract infection, site not specified [N39.0] 04/15/2014  . Chest pain, unspecified [R07.9] 04/15/2014  . D-dimer, elevated [R79.1] 04/15/2014  . Transaminitis [R74.0] 09/24/2013  . Cocaine dependence ([place]) [F14.20] 09/20/2013  . 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 Seizure ([place]) [R56.9] 03/15/2013  . Syncope [R55] 01/02/2013  . Leukocytopenia, unspecified [D72.819] 01/02/2013  . Left kidney mass [N28.89] 12/24/2012  . PTSD (post-traumatic stress disorder) [F43.10] 07/06/2012  . Peripheral vascular disease ([place]) [I73.9] 01/14/2012  . SEIZURE DISORDER [R56.9] 10/03/2008  . LIVER FUNCTION TESTS, ABNORMAL [R94.5] 12/29/2007  . HYPERCHOLESTEROLEMIA [E78.00] 03/21/2007  . Essential hypertension [I10] 03/21/2007    Past Psychiatric History: see HPI  Past Medical History:  Past Medical History  Diagnosis Date  . Seizures ([name])   . Hypertension   . Depression   . Pancreatitis   . Liver cirrhosis ([place])   . Coronary artery disease   . Angina   . Shortness of breath   . Headache(784.0)   . Peripheral vascular disease [facility]) April 2011    Left Pop  . Hypercholesteremia   . Schizophrenia ([place])   . Bipolar 1 disorder ([place])   . Cancer of kidney ([place]) dx'd 04/2013    lt nephrectomy  . Breast CA ([place]) dx'd 2009    bil w/ bil masectomy and oral meds  . H/O suicide attempt 2015    overdose    Past Surgical History  Procedure Laterality Date  . Chest surgery    . Left leg surgery    . Mastectomy    . Left kidney removal    . Breast surgery     Family History:  Family History  Problem Relation Age of Onset  . Stroke Other   .  Cancer Other   . Hyperlipidemia Mother   . Hypertension Mother    Family Psychiatric  History: see HPI Social History:  History  Alcohol Use  . 0.0 oz/week     History  Drug Use No    Comment: former    Social History   Social History  . Marital Status: Single     Spouse Name: N/A  . Number of Children: N/A  . Years of Education: N/A   Social History Main Topics  . Smoking status: Current Every Day Smoker  . Smokeless tobacco: None  . Alcohol Use: 0.0 oz/week  . Drug Use: No     Comment: former  . Sexual Activity: Yes    Birth Control/ Protection: Condom     Comment: anal   Other Topics Concern  . None   Social History Narrative   ** Merged History Encounter **        Hospital Course:  [name] is a 35 years old male admitted from best the [facility] for increased symptoms of depression, anxiety, relapse and drug of abuse, noncompliant with medication and status post intentional drug overdose as a suicide attempt.  Physical Findings: AIMS: Facial and Oral Movements Muscles of Facial Expression: None, normal Lips and Perioral Area: None, normal Jaw: None, normal Tongue: None, normal,Extremity Movements Upper (arms, wrists, hands, fingers): None, normal Lower (legs, knees, ankles, toes): None, normal, Trunk Movements Neck, shoulders, hips: None, normal, Overall Severity Severity of abnormal movements (highest score from questions above): None, normal Incapacitation due to abnormal movements: None, normal [name] awareness of abnormal movements (rate only patient's report): No Awareness, Dental Status Current problems with teeth and/or dentures?: No Does patient usually wear dentures?: No  CIWA:  CIWA-Ar Total: 6 COWS:     Musculoskeletal: Strength & Muscle Tone: within normal limits Gait & Station: normal Patient leans: N/A  Psychiatric Specialty Exam:  SEE MD SRA Physical Exam  Vitals reviewed. Psychiatric: His affect is not angry. Thought content is not paranoid. He does not exhibit a depressed mood. He expresses no homicidal and no suicidal ideation.    Review of Systems  Psychiatric/Behavioral: Negative for depression and suicidal ideas.  All other systems reviewed and are negative.   Blood pressure  115/78, pulse 72, temperature 98.4 F (36.9 C), temperature source Oral, resp. rate 16, height 5\' 6"  (1.676 m), weight 71.895 kg (158 lb 8 oz), SpO2 100 %.Body mass index is 25.59 kg/(m^2).    Have you used any form of tobacco in the last 30 days? (Cigarettes, Smokeless Tobacco, Cigars, and/or Pipes): Yes  Has this patient used any form of tobacco in the last 30 days? (Cigarettes, Smokeless Tobacco, Cigars, and/or Pipes) Yes, N/A  Blood Alcohol level:  Lab Results  Component Value Date   [facility] 129* 12/29/2015   ETH <5 [id number]    Metabolic Disorder Labs:  Lab Results  Component Value Date   HGBA1C 5.3 09/11/2014   MPG 105 09/11/2014   MPG 100 12/24/2012   No results found for: PROLACTIN Lab Results  Component Value Date   CHOL 321* 09/11/2014   TRIG 134 07/15/2015   HDL 77 09/11/2014   CHOLHDL 4.2 09/11/2014   VLDL 15 09/11/2014   LDLCALC 229* 09/11/2014   LDLCALC 181* 04/16/2014    See Psychiatric Specialty Exam and Suicide Risk Assessment completed by Attending Physician prior to discharge.  Discharge destination:  Home  Is patient on multiple antipsychotic therapies at discharge:  No   Has  Patient had three or more failed trials of antipsychotic monotherapy by history:  No  Recommended Plan for Multiple Antipsychotic Therapies: NA     Medication List    STOP taking these medications        ARIPiprazole 10 MG tablet  Commonly known as:  ABILIFY     DULoxetine 30 MG capsule  Commonly known as:  CYMBALTA     traZODone 100 MG tablet  Commonly known as:  DESYREL      TAKE these medications      Indication   amLODipine 5 MG tablet  Commonly known as:  NORVASC  Take 1 tablet (5 mg total) by mouth daily.   Indication:  High Blood Pressure     atorvastatin 20 MG tablet  Commonly known as:  LIPITOR  Take 1 tablet (20 mg total) by mouth daily.   Indication:  High Amount of Triglycerides in the Blood     hydrOXYzine 25 MG tablet  Commonly known as:   ATARAX/VISTARIL  Take 1 tablet (25 mg total) by mouth every 6 (six) hours as needed for anxiety.   Indication:  Anxiety Neurosis     metoprolol tartrate 25 MG tablet  Commonly known as:  LOPRESSOR  Take 0.5 tablets (12.5 mg total) by mouth 2 (two) times daily.   Indication:  High Blood Pressure     QUEtiapine 25 MG tablet  Commonly known as:  SEROQUEL  Take 1 tablet (25 mg total) by mouth 2 (two) times daily.   Indication:  mood stabilization/anxiety     QUEtiapine 50 MG tablet  Commonly known as:  SEROQUEL  Take 1 tablet (50 mg total) by mouth at bedtime.   Indication:  mood stabilization/Insomnia       Follow-up Information    Follow up with [facility].   Why:  Walk in hours between 8am-9am Monday through Friday for hospital follow-up/medication management/counseling services.    Contact information:   [address], [place] [address] Phone: [phone] Fax: [phone]      Follow up with [facility] On 01/06/2016.   Why:  Assessment for possible admission on Tuesday June 20th at [place]. ID, 2 week supply, & clothing in case of admission.    Contact information:   [address] [place], [place] [address] Phone: [phone] Fax: [phone]      Follow-up recommendations:  Activity:  as tol Diet:  as tol  Comments:  1.  Take all your medications as prescribed.   2.  Report any adverse side effects to outpatient provider. 3.  Patient instructed to not use alcohol or illegal drugs while on prescription medicines. 4.  In the event of worsening symptoms, instructed patient to call 911, the crisis hotline or go to nearest emergency room for evaluation of symptoms.  Signed: Janett Labella, NP [facility] 01/05/2016, 10:33 AM

## 2016-01-05 NOTE — Progress Notes (Signed)
Recreation Therapy Notes  Date: 06.19.2017 Time: 9:30am Location: [address] Group Room   Group Topic: Stress Management  Goal Area(s) Addresses:  Patient will actively participate in stress management techniques presented during session.   Behavioral Response: Did not attend.   Laureen Ochs Cayden Granholm, LRT/CTRS        Saniyah Mondesir L 01/05/2016 10:22 AM

## 2016-04-21 ENCOUNTER — Encounter (HOSPITAL_COMMUNITY): Payer: Self-pay

## 2016-04-21 ENCOUNTER — Emergency Department (HOSPITAL_COMMUNITY)
Admission: EM | Admit: 2016-04-21 | Discharge: 2016-04-22 | Disposition: A | Discharge status: Other Acute Inpatient Hospital | Payer: No Typology Code available for payment source | Attending: Emergency Medicine | Admitting: Emergency Medicine

## 2016-04-21 DIAGNOSIS — F333 Major depressive disorder, recurrent, severe with psychotic symptoms: Secondary | ICD-10-CM | POA: Diagnosis present

## 2016-04-21 DIAGNOSIS — F4310 Post-traumatic stress disorder, unspecified: Secondary | ICD-10-CM | POA: Diagnosis present

## 2016-04-21 DIAGNOSIS — Z853 Personal history of malignant neoplasm of breast: Secondary | ICD-10-CM | POA: Insufficient documentation

## 2016-04-21 DIAGNOSIS — F1024 Alcohol dependence with alcohol-induced mood disorder: Secondary | ICD-10-CM | POA: Diagnosis present

## 2016-04-21 DIAGNOSIS — I2510 Atherosclerotic heart disease of native coronary artery without angina pectoris: Secondary | ICD-10-CM | POA: Insufficient documentation

## 2016-04-21 DIAGNOSIS — F332 Major depressive disorder, recurrent severe without psychotic features: Secondary | ICD-10-CM

## 2016-04-21 DIAGNOSIS — Z79899 Other long term (current) drug therapy: Secondary | ICD-10-CM | POA: Insufficient documentation

## 2016-04-21 DIAGNOSIS — Z85528 Personal history of other malignant neoplasm of kidney: Secondary | ICD-10-CM | POA: Insufficient documentation

## 2016-04-21 DIAGNOSIS — F1910 Other psychoactive substance abuse, uncomplicated: Secondary | ICD-10-CM | POA: Insufficient documentation

## 2016-04-21 DIAGNOSIS — R45851 Suicidal ideations: Secondary | ICD-10-CM

## 2016-04-21 DIAGNOSIS — F17200 Nicotine dependence, unspecified, uncomplicated: Secondary | ICD-10-CM | POA: Insufficient documentation

## 2016-04-21 DIAGNOSIS — R319 Hematuria, unspecified: Secondary | ICD-10-CM | POA: Insufficient documentation

## 2016-04-21 DIAGNOSIS — I10 Essential (primary) hypertension: Secondary | ICD-10-CM | POA: Insufficient documentation

## 2016-04-21 DIAGNOSIS — F1010 Alcohol abuse, uncomplicated: Secondary | ICD-10-CM | POA: Insufficient documentation

## 2016-04-21 LAB — CBC
HEMATOCRIT: 42.6 % (ref 39.0–52.0)
Hemoglobin: 14.8 g/dL (ref 13.0–17.0)
MCH: 29.1 pg (ref 26.0–34.0)
MCHC: 34.7 g/dL (ref 30.0–36.0)
MCV: 83.7 fL (ref 78.0–100.0)
PLATELETS: 240 10*3/uL (ref 150–400)
RBC: 5.09 MIL/uL (ref 4.22–5.81)
RDW: 13.8 % (ref 11.5–15.5)
WBC: 6 10*3/uL (ref 4.0–10.5)

## 2016-04-21 LAB — COMPREHENSIVE METABOLIC PANEL
ALBUMIN: 4.4 g/dL (ref 3.5–5.0)
ALT: 23 U/L (ref 17–63)
AST: 35 U/L (ref 15–41)
Alkaline Phosphatase: 45 U/L (ref 38–126)
Anion gap: 8 (ref 5–15)
BUN: 12 mg/dL (ref 6–20)
CHLORIDE: 106 mmol/L (ref 101–111)
CO2: 27 mmol/L (ref 22–32)
Calcium: 8.9 mg/dL (ref 8.9–10.3)
Creatinine, Ser: 1.08 mg/dL (ref 0.61–1.24)
GFR calc Af Amer: >60 mL/min (ref >60)
GFR calc non Af Amer: >60 mL/min (ref >60)
GLUCOSE: 109 mg/dL — AB (ref 65–99)
POTASSIUM: 3.9 mmol/L (ref 3.5–5.1)
Sodium: 141 mmol/L (ref 135–145)
Total Bilirubin: 0.5 mg/dL (ref 0.3–1.2)
Total Protein: 7.5 g/dL (ref 6.5–8.1)

## 2016-04-21 LAB — URINALYSIS, ROUTINE W REFLEX MICROSCOPIC
Bilirubin Urine: NEGATIVE
GLUCOSE, UA: NEGATIVE mg/dL
Ketones, ur: NEGATIVE mg/dL
LEUKOCYTES UA: NEGATIVE
Nitrite: NEGATIVE
Protein, ur: NEGATIVE mg/dL
Specific Gravity, Urine: 1.011 (ref 1.005–1.030)
pH: 5.5 (ref 5.0–8.0)

## 2016-04-21 LAB — RAPID URINE DRUG SCREEN, HOSP PERFORMED
AMPHETAMINES: NOT DETECTED
BENZODIAZEPINES: NOT DETECTED
Barbiturates: NOT DETECTED
COCAINE: POSITIVE — AB
Opiates: NOT DETECTED
Tetrahydrocannabinol: NOT DETECTED

## 2016-04-21 LAB — URINE MICROSCOPIC-ADD ON

## 2016-04-21 LAB — ACETAMINOPHEN LEVEL

## 2016-04-21 LAB — LIPASE, BLOOD: LIPASE: 23 U/L (ref 11–51)

## 2016-04-21 LAB — CBG MONITORING, ED: GLUCOSE-CAPILLARY: 124 mg/dL — AB (ref 65–99)

## 2016-04-21 LAB — SALICYLATE LEVEL: Salicylate Lvl: <4 mg/dL (ref 2.8–30.0)

## 2016-04-21 LAB — ETHANOL: ALCOHOL ETHYL (B): 303 mg/dL — AB (ref <5)

## 2016-04-21 MED ORDER — QUETIAPINE FUMARATE 25 MG PO TABS
25.0000 mg | ORAL_TABLET | Freq: Two times a day (BID) | ORAL | Status: DC
  Administered 2016-04-22: 25 mg via ORAL
  Filled 2016-04-21: qty 1

## 2016-04-21 MED ORDER — ONDANSETRON HCL 4 MG PO TABS
4.0000 mg | ORAL_TABLET | Freq: Three times a day (TID) | ORAL | Status: DC | PRN
  Administered 2016-04-22: 4 mg via ORAL
  Filled 2016-04-21: qty 1

## 2016-04-21 MED ORDER — TRAZODONE HCL 100 MG PO TABS
300.0000 mg | ORAL_TABLET | Freq: Every day | ORAL | Status: DC
  Administered 2016-04-21: 300 mg via ORAL
  Filled 2016-04-21: qty 3

## 2016-04-21 MED ORDER — QUETIAPINE FUMARATE 50 MG PO TABS
50.0000 mg | ORAL_TABLET | Freq: Every day | ORAL | Status: DC
  Administered 2016-04-21: 50 mg via ORAL
  Filled 2016-04-21: qty 1

## 2016-04-21 MED ORDER — LORAZEPAM 1 MG PO TABS
0.0000 mg | ORAL_TABLET | Freq: Four times a day (QID) | ORAL | Status: DC
Start: 2016-04-21 — End: 2016-04-22
  Administered 2016-04-21: 2 mg via ORAL
  Filled 2016-04-21: qty 2

## 2016-04-21 MED ORDER — HYDROXYZINE HCL 25 MG PO TABS: 25.0000 mg | ORAL_TABLET | Freq: Four times a day (QID) | ORAL | Status: DC | PRN

## 2016-04-21 MED ORDER — METOPROLOL TARTRATE 25 MG PO TABS
12.5000 mg | ORAL_TABLET | Freq: Two times a day (BID) | ORAL | Status: DC
  Administered 2016-04-21 – 2016-04-22 (×2): 12.5 mg via ORAL
  Filled 2016-04-21: qty 0.5
  Filled 2016-04-21: qty 1

## 2016-04-21 MED ORDER — AMLODIPINE BESYLATE 5 MG PO TABS
5.0000 mg | ORAL_TABLET | Freq: Every day | ORAL | Status: DC
  Administered 2016-04-22: 5 mg via ORAL
  Filled 2016-04-21: qty 1

## 2016-04-21 MED ORDER — NICOTINE 21 MG/24HR TD PT24
21.0000 mg | MEDICATED_PATCH | Freq: Every day | TRANSDERMAL | Status: DC
  Filled 2016-04-21: qty 1

## 2016-04-21 MED ORDER — ATORVASTATIN CALCIUM 20 MG PO TABS
20.0000 mg | ORAL_TABLET | Freq: Every day | ORAL | Status: DC
  Filled 2016-04-21: qty 1

## 2016-04-21 MED ORDER — GI COCKTAIL ~~LOC~~: 30.0000 mL | Freq: Three times a day (TID) | ORAL | Status: DC | PRN

## 2016-04-21 MED ORDER — VITAMIN B-1 100 MG PO TABS
100.0000 mg | ORAL_TABLET | Freq: Every day | ORAL | Status: DC
  Administered 2016-04-22: 100 mg via ORAL
  Filled 2016-04-21: qty 1

## 2016-04-21 MED ORDER — ALUM & MAG HYDROXIDE-SIMETH 200-200-20 MG/5ML PO SUSP: 30.0000 mL | ORAL | Status: DC | PRN

## 2016-04-21 MED ORDER — THIAMINE HCL 100 MG/ML IJ SOLN: 100.0000 mg | Freq: Every day | INTRAMUSCULAR | Status: DC

## 2016-04-21 MED ORDER — LORAZEPAM 1 MG PO TABS: 0.0000 mg | ORAL_TABLET | Freq: Two times a day (BID) | ORAL | Status: DC

## 2016-04-21 NOTE — ED Notes (Signed)
Pt with SI, reports hearing voices telling him to kill himself.  Pt under IVC, told counselor he wants to slit his wrists. A&O x 3, no distress noted, calm & cooperative, Monitoring for safety, Q 15 min checks in effect.

## 2016-04-21 NOTE — BH Assessment (Addendum)
Tele Assessment Note   [name] is an 35 y.o. male.  -Clinician reviewed note by Jeannett Senior, PA. Patient presents to emergency department under involuntary commitment for suicidal ideations. Patient states he started feeling suicidal today. He denies any precipitating events. He has history of suicide attempts in the past. He states he did drink large amount of alcohol today and admits to doing cocaine. He states he did call the crisis center himself, who took him to [facility] and placed them under involuntary commitment. Patient with recent admission for the same. Patient is also complaining of upper abdominal pain and urinating blood.  Patient says that he contacted his probation officer and told him he felt like killing himself.  Officer took him to [employer].  Patient reports having blood in urine over the last 3 days.  [facility] initiated IVC papers and patient was brought to [facility].  Patient currently says he has plans to overdose on pills to kill himself.  Patient cannot currently contract for safety.  Pt has had two previous incidents of trying to overdose to kill self.  Patient denies HI or A/V hallucinations.  Patient says he drinks a half gallon of wine per day.  Has drank to day.  Patient has BAL of 303.  He told this clinician that he did not use cocaine yet he is positive for it on UDS.  Patient has been going to [facility] for outpatient care for years.  He also has been to [facility] in June, May, March of this year.  -Clinician discussed patient care with Patriciaann Clan, PA.  Patient meets inpatient care criteria.  At this time there are no appropriate beds at [facility].  TTS to seek placement.  Diagnosis: Bipolar 1 d/o; ETOH use d/o severe;   Past Medical History:  Past Medical History:  Diagnosis Date  . Angina   . Bipolar 1 disorder ([place])   . Breast CA ([place]) dx'd 2009   bil w/ bil masectomy and oral meds  . Cancer of kidney ([place]) dx'd 04/2013   lt nephrectomy  . Coronary artery  disease   . Depression   . H/O suicide attempt 2015   overdose  . Headache(784.0)   . Hypercholesteremia   . Hypertension   . Liver cirrhosis ([place])   . Pancreatitis   . Peripheral vascular disease [facility]) April 2011   Left Pop  . Schizophrenia ([place])   . Seizures ([place])   . Shortness of breath     Past Surgical History:  Procedure Laterality Date  . BREAST SURGERY    . CHEST SURGERY    . left kidney removal    . left leg surgery    . MASTECTOMY      Family History:  Family History  Problem Relation Age of Onset  . Stroke Other   . Cancer Other   . Hyperlipidemia Mother   . Hypertension Mother     Social History:  reports that he has been smoking.  He has never used smokeless tobacco. He reports that he drinks alcohol. He reports that he does not use drugs.  Additional Social History:  Alcohol / Drug Use Pain Medications: Pt says he is on pain management and takes neurontin. Prescriptions: Cymbalta, Seroquel, Trazadone Over the Counter: None History of alcohol / drug use?: Yes Withdrawal Symptoms: Weakness, Patient aware of relationship between substance abuse and physical/medical complications, Seizures, Tremors, Sweats, Fever / Chills, Diarrhea Onset of Seizures: Epilepsy according to patient Date of most recent seizure: "about a  month ago" Substance #1 Name of Substance 1: ETOH (wine & beer) 1 - Age of First Use: 35 years of age 35 - Amount (size/oz): Half gallon (primarily wine) 1 - Frequency: Daily 1 - Duration: On-going 1 - Last Use / Amount: 10/04 (around half gallon)  CIWA: CIWA-Ar BP: 121/84 Pulse Rate: 81 COWS:    PATIENT STRENGTHS: (choose at least two) Ability for insight Capable of independent living Communication skills Supportive family/friends  Allergies:  Allergies  Allergen Reactions  . Codeine Hives, Itching and Swelling  . Penicillins Swelling    Has patient had a PCN reaction causing immediate rash, facial/tongue/throat swelling, SOB  or lightheadedness with hypotension: Yes Has patient had a PCN reaction causing severe rash involving mucus membranes or skin necrosis: Yes Has patient had a PCN reaction that required hospitalization Yes-ed visit Has patient had a PCN reaction occurring within the last 10 years: Yes If all of the above answers are "NO", then may proceed with Cephalosporin use.   . Depakote [Divalproex Sodium] Hives and Other (See Comments)    "Bug out and hallucinate"  . Morphine Itching  . Coconut Flavor Itching and Swelling  . Depakote Er [Divalproex Sodium Er] Hives  . Oxycodone Itching and Swelling  . Hydrocodone-Acetaminophen Itching and Rash    Home Medications:  (Not in a hospital admission)  OB/GYN Status:  No LMP for male patient.  General Assessment Data Location of Assessment: [facility] TTS Assessment: In system Is this a Tele or Face-to-Face Assessment?: Face-to-Face Is this an Initial Assessment or a Re-assessment for this encounter?: Initial Assessment Marital status: Single Is patient pregnant?: No Pregnancy Status: No Living Arrangements: Parent (Currently staying w/ parents.) Can pt return to current living arrangement?: Yes Admission Status: Involuntary Is patient capable of signing voluntary admission?: No Referral Source: Other Insurance type: self pay     Crisis Care Plan Living Arrangements: Parent (Currently staying w/ parents.) Name of Psychiatrist: Beverly Sessions Name of Therapist: [facility]  Education Status Is patient currently in school?: No Highest grade of school patient has completed: GED  Risk to self with the past 6 months Suicidal Ideation: Yes-Currently Present Has patient been a risk to self within the past 6 months prior to admission? : Yes Suicidal Intent: Yes-Currently Present Has patient had any suicidal intent within the past 6 months prior to admission? : Yes Is patient at risk for suicide?: Yes Suicidal Plan?: Yes-Currently Present Has patient had  any suicidal plan within the past 6 months prior to admission? : Yes Specify Current Suicidal Plan: Overdose on pills Access to Means: Yes Specify Access to Suicidal Means: Has medications. What has been your use of drugs/alcohol within the last 12 months?: ETOH daily Previous Attempts/Gestures: Yes How many times?: 2 Other Self Harm Risks: Yes Triggers for Past Attempts: Unpredictable Intentional Self Injurious Behavior: Cutting Comment - Self Injurious Behavior: Last incident of cutting was last year Family Suicide History: No Recent stressful life event(s): Other (Comment) (Told his probation officer he wanted help.) Persecutory voices/beliefs?: Yes Depression: Yes Depression Symptoms: Despondent, Insomnia, Isolating, Guilt, Loss of interest in usual pleasures, Feeling worthless/self pity Substance abuse history and/or treatment for substance abuse?: Yes Suicide prevention information given to non-admitted patients: Not applicable  Risk to Others within the past 6 months Homicidal Ideation: No Does patient have any lifetime risk of violence toward others beyond the six months prior to admission? 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, manufacturing)  Psychosis Hallucinations: None noted Delusions: None noted  Mental Status Report Appearance/Hygiene: Unremarkable, In scrubs Eye Contact: Poor Motor Activity: Freedom of movement, Unremarkable Speech: Logical/coherent, Soft, Slurred Level of Consciousness: Drowsy Mood: Depressed, Apathetic, Despair, Sad Affect: Blunted, Depressed,  Sad Anxiety Level: Moderate Thought Processes: Coherent, Relevant Judgement: Impaired Orientation: Appropriate for developmental age Obsessive Compulsive Thoughts/Behaviors: None  Cognitive Functioning Concentration: Poor Memory: Recent Impaired, Remote Impaired IQ: Average Insight: Fair Impulse Control: Fair Appetite: Poor Weight Loss: 0 Weight Gain: 0 Sleep: No Change Total Hours of Sleep:  (8 hours when taking trazadone) Vegetative Symptoms: None  ADLScreening [facility] Assessment Services) [name] cognitive ability adequate to safely complete daily activities?: Yes Patient able to express need for assistance with ADLs?: Yes Independently performs ADLs?: Yes (appropriate for developmental age)  Prior Inpatient Therapy Prior Inpatient Therapy: Yes Prior Therapy Dates: 06/17. 05/17, 03/17 Prior Therapy Facilty/Provider(s): [facility] Reason for Treatment: SI, SA  Prior Outpatient Therapy Prior Outpatient Therapy: Yes Prior Therapy Dates: Past 15 years Prior Therapy Facilty/Provider(s): [facility], [facility] Reason for Treatment: med management Does patient have an ACCT team?: No Does patient have Intensive In-House Services?  : No Does patient have [facility] services? : No Does patient have P4CC services?: No  ADL Screening (condition at time of admission) [name] cognitive ability adequate to safely complete daily activities?: Yes Is the patient deaf or have difficulty hearing?: No Does the patient have difficulty seeing, even when wearing glasses/contacts?: No Does the patient have difficulty concentrating, remembering, or making decisions?: No Patient able to express need for assistance with ADLs?: Yes Does the patient have difficulty dressing or bathing?: No Independently performs ADLs?: Yes (appropriate for developmental age) Does the patient have difficulty walking or climbing stairs?: No Weakness of Legs: None Weakness of Arms/Hands: None       Abuse/Neglect Assessment  (Assessment to be complete while patient is alone) Physical Abuse: Denies Verbal Abuse: Denies Sexual Abuse: Denies Exploitation of patient/patient's resources: Denies Self-Neglect: Denies     Regulatory affairs officer (For Healthcare) Does patient have an advance directive?: No Would patient like information on creating an advanced directive?: No - patient declined information    Additional Information 1:1 In Past 12 Months?: No CIRT Risk: No Elopement Risk: No Does patient have medical clearance?: Yes     Disposition:  Disposition Initial Assessment Completed for this Encounter: Yes Disposition of Patient: Other dispositions Other disposition(s): Other (Comment) (To be reviewed with PA)  Curlene Dolphin Ray 04/21/2016 8:39 PM

## 2016-04-21 NOTE — ED Provider Notes (Signed)
[place] DEPT Provider Note   CSN: [id number] Arrival date & time: 04/21/16  1701     History   Chief Complaint Chief Complaint  Patient presents with  . Suicidal    HPI [name] is a 35 y.o. male.  HPI [name] is a 35 y.o. male  with history of bipolar disorder, schizophrenia, seizures, history of breast and kidney cancer, has had bilateral mastectomy and left nephrectomy, coronary artery disease, hypertension, liver cirrhosis, pancreatitis, peripheral vascular disease, presents to emergency department under involuntary commitment for suicidal ideations. Patient states he started feeling suicidal today. He denies any precipitating events. He has history of suicide attempts in the past. He states he did drink large amount of alcohol today and admits to doing cocaine. He states he did call the crisis center himself, who took him to [facility] and placed them under involuntary commitment. Patient with recent admission for the same. Patient is also complaining of upper abdominal pain and urinating blood. No other complaints at this time.  Past Medical History:  Diagnosis Date  . Angina   . Bipolar 1 disorder ([place])   . Breast CA ([place]) dx'd 2009   bil w/ bil masectomy and oral meds  . Cancer of kidney ([place]) dx'd 04/2013   lt nephrectomy  . Coronary artery disease   . Depression   . H/O suicide attempt 2015   overdose  . Headache(784.0)   . Hypercholesteremia   . Hypertension   . Liver cirrhosis ([place])   . Pancreatitis   . Peripheral vascular disease [facility]) April 2011   Left Pop  . Schizophrenia ([place])   . Seizures ([place])   . Shortness of breath     Patient Active Problem List   Diagnosis Date Noted  . Insomnia   . HTN (hypertension), benign   . Severe episode of recurrent major depressive disorder, without psychotic features ([place])   . Major depressive disorder, recurrent episode, severe, with psychotic behavior ([place]) 12/30/2015  . Depression, major, severe  recurrence ([place]) 12/30/2015  . Substance induced mood disorder ([place]) 12/02/2015  . Mood disorder in conditions classified elsewhere   . MDD (major depressive disorder), recurrent severe, without psychosis ([place]) 10/08/2015  . Malnutrition of moderate degree 09/24/2015  . Alcohol withdrawal ([place]) 09/23/2015  . Tobacco use disorder 07/16/2015  . Elevated transaminase level   . Drug overdose, intentional ([place]) 07/12/2015  . Alcohol use disorder, severe, dependence ([place]) 04/15/2015  . Cocaine abuse with cocaine-induced mood disorder ([place]) 04/11/2015  . Overdose 04/10/2015  . Severe recurrent major depressive disorder with psychotic features ([place])   . Severe major depression with psychotic features ([place]) 09/11/2014  . Alcohol-induced mood disorder ([place]) 09/10/2014  . Suicidal ideation   . Tylenol overdose   . Polysubstance abuse   . Overdose of acetaminophen 08/03/2014  . Overdose by acetaminophen 08/03/2014  . Cocaine abuse 04/16/2014  . Thrombocytopenia ([place]) 04/15/2014  . Urinary tract infection, site not specified 04/15/2014  . Chest pain, unspecified 04/15/2014  . D-dimer, elevated 04/15/2014  . Transaminitis 09/24/2013  . Cocaine dependence ([place]) 09/20/2013  . S/p nephrectomy 04/28/2013  . Seizure ([place]) 03/15/2013  . Syncope 01/02/2013  . Leukocytopenia, unspecified 01/02/2013  . Left kidney mass 12/24/2012  . PTSD (post-traumatic stress disorder) 07/06/2012  . Peripheral vascular disease ([place]) 01/14/2012  . SEIZURE DISORDER 10/03/2008  . LIVER FUNCTION TESTS, ABNORMAL 12/29/2007  . HYPERCHOLESTEROLEMIA 03/21/2007  . Essential hypertension 03/21/2007    Past Surgical History:  Procedure Laterality Date  . BREAST  SURGERY    . CHEST SURGERY    . left kidney removal    . left leg surgery    . MASTECTOMY         Home Medications    Prior to Admission medications   Medication Sig Start Date End Date Taking? Authorizing Provider  amLODipine (NORVASC) 5 MG tablet Take  1 tablet (5 mg total) by mouth daily. 01/05/16  Yes Kerrie [name], NP  atorvastatin (LIPITOR) 20 MG tablet Take 1 tablet (20 mg total) by mouth daily. 01/05/16  Yes Kerrie [name], NP  hydrOXYzine (ATARAX/VISTARIL) 25 MG tablet Take 1 tablet (25 mg total) by mouth every 6 (six) hours as needed for anxiety. 01/05/16  Yes Kerrie [name], NP  metoprolol tartrate (LOPRESSOR) 25 MG tablet Take 0.5 tablets (12.5 mg total) by mouth 2 (two) times daily. 01/05/16  Yes Kerrie [name], NP  QUEtiapine (SEROQUEL) 25 MG tablet Take 1 tablet (25 mg total) by mouth 2 (two) times daily. 01/05/16  Yes Kerrie [name], NP  QUEtiapine (SEROQUEL) 50 MG tablet Take 1 tablet (50 mg total) by mouth at bedtime. 01/05/16  Yes Kerrie [name], NP    Family History Family History  Problem Relation Age of Onset  . Stroke Other   . Cancer Other   . Hyperlipidemia Mother   . Hypertension Mother     Social History Social History  Substance Use Topics  . Smoking status: Current Every Day Smoker  . Smokeless tobacco: Never Used  . Alcohol use 0.0 oz/week     Allergies   Codeine; Penicillins; Depakote [divalproex sodium]; Morphine; Coconut flavor; Depakote er [divalproex sodium er]; Oxycodone; and Hydrocodone-acetaminophen   Review of Systems Review of Systems  Constitutional: Negative for chills and fever.  Respiratory: Negative for cough, chest tightness and shortness of breath.   Cardiovascular: Negative for chest pain, palpitations and leg swelling.  Gastrointestinal: Positive for abdominal pain. Negative for abdominal distention, diarrhea, nausea and vomiting.  Genitourinary: Positive for hematuria. Negative for dysuria, frequency and urgency.  Musculoskeletal: Negative for arthralgias, myalgias, neck pain and neck stiffness.  Skin: Negative for rash.  Allergic/Immunologic: Negative for immunocompromised state.  Neurological: Negative for dizziness, weakness, light-headedness, numbness and headaches.    Psychiatric/Behavioral: Positive for dysphoric mood and suicidal ideas. The patient is nervous/anxious.   All other systems reviewed and are negative.    Physical Exam Updated Vital Signs BP 121/84 (BP Location: Left Arm)   Pulse 81   Temp 97.7 F (36.5 C) (Oral)   Resp 20   SpO2 100%   Physical Exam  Constitutional: He appears well-developed and well-nourished.  Appears intoxicated  HENT:  Head: Normocephalic and atraumatic.  Eyes: Conjunctivae are normal.  Neck: Neck supple.  Cardiovascular: Normal rate, regular rhythm and normal heart sounds.   Pulmonary/Chest: Effort normal. No respiratory distress. He has no wheezes. He has no rales.  Abdominal: Soft. Bowel sounds are normal. He exhibits no distension. There is no tenderness. There is no rebound.  Musculoskeletal: He exhibits no edema.  Neurological: He is alert.  Skin: Skin is warm and dry.  Psychiatric: His affect is blunt. His speech is slurred. Cognition and memory are impaired. He expresses suicidal ideation. He expresses no homicidal ideation. He expresses no homicidal plans.  Nursing note and vitals reviewed.    ED Treatments / Results  Labs (all labs ordered are listed, but only abnormal results are displayed) Labs Reviewed  COMPREHENSIVE METABOLIC PANEL - Abnormal; Notable for the following:  Result Value   Glucose, Bld 109 (*)    All other components within normal limits  ETHANOL - Abnormal; Notable for the following:    Alcohol, Ethyl (B) 303 (*)    All other components within normal limits  ACETAMINOPHEN LEVEL - Abnormal; Notable for the following:    Acetaminophen (Tylenol), Serum <10 (*)    All other components within normal limits  CBG MONITORING, ED - Abnormal; Notable for the following:    Glucose-Capillary 124 (*)    All other components within normal limits  SALICYLATE LEVEL  CBC  URINE RAPID DRUG SCREEN, HOSP PERFORMED  URINALYSIS, ROUTINE W REFLEX MICROSCOPIC (NOT AT [facility])  LIPASE,  BLOOD    EKG  EKG Interpretation None       Radiology No results found.  Procedures Procedures (including critical care time)  Medications Ordered in ED Medications  LORazepam (ATIVAN) tablet 0-4 mg (not administered)    Followed by  LORazepam (ATIVAN) tablet 0-4 mg (not administered)  thiamine (VITAMIN B-1) tablet 100 mg (not administered)    Or  thiamine (B-1) injection 100 mg (not administered)  nicotine (NICODERM CQ - dosed in mg/24 hours) patch 21 mg (not administered)  alum & mag hydroxide-simeth (MAALOX/MYLANTA) 200-200-20 MG/5ML suspension 30 mL (not administered)  ondansetron (ZOFRAN) tablet 4 mg (not administered)  gi cocktail (Maalox,Lidocaine,Donnatal) (not administered)     Initial Impression / Assessment and Plan / ED Course  I have reviewed the triage vital signs and the nursing notes.  Pertinent labs & imaging results that were available during my care of the patient were reviewed by me and considered in my medical decision making (see chart for details).  Clinical Course   Patient seen and examined. Patient appears to be intoxicated. He endorses suicidal ideations. History of the same with prior overdoses. He is also complaining of upper abdominal pain, most likely gastritis, however he does have history of pancreatitis, will check lipase. LFTs are normal. Abdomen is soft, no peritoneal signs. Patient is also complaining of hematuria, history of kidney cancer. Will check urinalysis. Patient has had a left nephrectomy. Kidney function is normal today. Holding orders are placed. We'll get TTS consult.  9:19 PM Pt signed out at shift change to Dr. Betsey Holiday pending UA and sobering.   Final Clinical Impressions(s) / ED Diagnoses   Final diagnoses:  Suicidal ideation  Alcohol abuse  Polysubstance abuse    New Prescriptions New Prescriptions   No medications on file     Jeannett Senior, Hershal Coria 04/21/16 2120    Margette Fast, MD 04/21/16 2238

## 2016-04-21 NOTE — ED Notes (Signed)
Pt has one personal belongings bag behind triage nursing station.

## 2016-04-21 NOTE — ED Triage Notes (Addendum)
Pt having suicidal thoughts.  Pt is IVC from crisis center.  Pt told counselor he wanted to slit his wrist.  [employer] brought patient to ED.  Was taken to crisis center by his parole officer.  Pt also states he is seeing demons.

## 2016-04-21 NOTE — ED Notes (Signed)
Bed: WTR8 Expected date:  Expected time:  Means of arrival:  Comments: 

## 2016-04-21 NOTE — ED Notes (Signed)
Pt given meal tray per request

## 2016-04-21 NOTE — ED Notes (Signed)
Pt sts he is unable to urinate at this time.  Pt sts he has being "peeing blood" x3days

## 2016-04-22 ENCOUNTER — Inpatient Hospital Stay (HOSPITAL_COMMUNITY)
Admission: AD | Admit: 2016-04-22 | Discharge: 2016-04-27 | DRG: 897 | Disposition: A | Discharge status: Home / Self Care | Payer: Federal, State, Local not specified - Other | Attending: Psychiatry | Admitting: Psychiatry

## 2016-04-22 ENCOUNTER — Encounter (HOSPITAL_COMMUNITY): Payer: Self-pay | Admitting: *Deleted

## 2016-04-22 DIAGNOSIS — Z853 Personal history of malignant neoplasm of breast: Secondary | ICD-10-CM | POA: Diagnosis not present

## 2016-04-22 DIAGNOSIS — F1414 Cocaine abuse with cocaine-induced mood disorder: Secondary | ICD-10-CM | POA: Diagnosis present

## 2016-04-22 DIAGNOSIS — Z653 Problems related to other legal circumstances: Secondary | ICD-10-CM | POA: Diagnosis not present

## 2016-04-22 DIAGNOSIS — F1020 Alcohol dependence, uncomplicated: Secondary | ICD-10-CM | POA: Diagnosis present

## 2016-04-22 DIAGNOSIS — F209 Schizophrenia, unspecified: Secondary | ICD-10-CM | POA: Diagnosis present

## 2016-04-22 DIAGNOSIS — Z880 Allergy status to penicillin: Secondary | ICD-10-CM

## 2016-04-22 DIAGNOSIS — E7800 Pure hypercholesterolemia, unspecified: Secondary | ICD-10-CM | POA: Diagnosis present

## 2016-04-22 DIAGNOSIS — F1424 Cocaine dependence with cocaine-induced mood disorder: Secondary | ICD-10-CM | POA: Diagnosis present

## 2016-04-22 DIAGNOSIS — I10 Essential (primary) hypertension: Secondary | ICD-10-CM | POA: Diagnosis present

## 2016-04-22 DIAGNOSIS — Z9889 Other specified postprocedural states: Secondary | ICD-10-CM

## 2016-04-22 DIAGNOSIS — Z915 Personal history of self-harm: Secondary | ICD-10-CM | POA: Diagnosis not present

## 2016-04-22 DIAGNOSIS — Z8249 Family history of ischemic heart disease and other diseases of the circulatory system: Secondary | ICD-10-CM

## 2016-04-22 DIAGNOSIS — G40909 Epilepsy, unspecified, not intractable, without status epilepticus: Secondary | ICD-10-CM | POA: Diagnosis present

## 2016-04-22 DIAGNOSIS — Y908 Blood alcohol level of 240 mg/100 ml or more: Secondary | ICD-10-CM | POA: Diagnosis present

## 2016-04-22 DIAGNOSIS — Z888 Allergy status to other drugs, medicaments and biological substances status: Secondary | ICD-10-CM

## 2016-04-22 DIAGNOSIS — I2510 Atherosclerotic heart disease of native coronary artery without angina pectoris: Secondary | ICD-10-CM | POA: Diagnosis present

## 2016-04-22 DIAGNOSIS — Z808 Family history of malignant neoplasm of other organs or systems: Secondary | ICD-10-CM | POA: Diagnosis not present

## 2016-04-22 DIAGNOSIS — Z823 Family history of stroke: Secondary | ICD-10-CM | POA: Diagnosis not present

## 2016-04-22 DIAGNOSIS — Z85528 Personal history of other malignant neoplasm of kidney: Secondary | ICD-10-CM

## 2016-04-22 DIAGNOSIS — I739 Peripheral vascular disease, unspecified: Secondary | ICD-10-CM | POA: Diagnosis present

## 2016-04-22 DIAGNOSIS — F10229 Alcohol dependence with intoxication, unspecified: Principal | ICD-10-CM | POA: Diagnosis present

## 2016-04-22 DIAGNOSIS — F17210 Nicotine dependence, cigarettes, uncomplicated: Secondary | ICD-10-CM

## 2016-04-22 DIAGNOSIS — Z885 Allergy status to narcotic agent status: Secondary | ICD-10-CM

## 2016-04-22 DIAGNOSIS — Z23 Encounter for immunization: Secondary | ICD-10-CM

## 2016-04-22 DIAGNOSIS — F319 Bipolar disorder, unspecified: Secondary | ICD-10-CM | POA: Diagnosis present

## 2016-04-22 DIAGNOSIS — G4700 Insomnia, unspecified: Secondary | ICD-10-CM | POA: Diagnosis present

## 2016-04-22 DIAGNOSIS — F17200 Nicotine dependence, unspecified, uncomplicated: Secondary | ICD-10-CM | POA: Diagnosis present

## 2016-04-22 DIAGNOSIS — H8110 Benign paroxysmal vertigo, unspecified ear: Secondary | ICD-10-CM | POA: Diagnosis not present

## 2016-04-22 DIAGNOSIS — K7460 Unspecified cirrhosis of liver: Secondary | ICD-10-CM | POA: Diagnosis present

## 2016-04-22 DIAGNOSIS — Z79899 Other long term (current) drug therapy: Secondary | ICD-10-CM

## 2016-04-22 DIAGNOSIS — F4310 Post-traumatic stress disorder, unspecified: Secondary | ICD-10-CM

## 2016-04-22 DIAGNOSIS — F411 Generalized anxiety disorder: Secondary | ICD-10-CM | POA: Diagnosis present

## 2016-04-22 DIAGNOSIS — R45851 Suicidal ideations: Secondary | ICD-10-CM

## 2016-04-22 DIAGNOSIS — F332 Major depressive disorder, recurrent severe without psychotic features: Secondary | ICD-10-CM | POA: Diagnosis present

## 2016-04-22 DIAGNOSIS — F1024 Alcohol dependence with alcohol-induced mood disorder: Secondary | ICD-10-CM | POA: Diagnosis present

## 2016-04-22 DIAGNOSIS — Z91018 Allergy to other foods: Secondary | ICD-10-CM

## 2016-04-22 DIAGNOSIS — R110 Nausea: Secondary | ICD-10-CM | POA: Diagnosis not present

## 2016-04-22 DIAGNOSIS — Z905 Acquired absence of kidney: Secondary | ICD-10-CM | POA: Diagnosis not present

## 2016-04-22 MED ORDER — LORAZEPAM 1 MG PO TABS
1.0000 mg | ORAL_TABLET | Freq: Three times a day (TID) | ORAL | Status: AC
  Administered 2016-04-24 (×3): 1 mg via ORAL
  Filled 2016-04-22 (×4): qty 1

## 2016-04-22 MED ORDER — ATORVASTATIN CALCIUM 20 MG PO TABS
20.0000 mg | ORAL_TABLET | Freq: Every day | ORAL | Status: DC
  Administered 2016-04-22 – 2016-04-26 (×5): 20 mg via ORAL
  Filled 2016-04-22: qty 1
  Filled 2016-04-22: qty 7
  Filled 2016-04-22 (×2): qty 1
  Filled 2016-04-22 (×2): qty 2
  Filled 2016-04-22 (×3): qty 1

## 2016-04-22 MED ORDER — LOPERAMIDE HCL 2 MG PO CAPS
2.0000 mg | ORAL_CAPSULE | ORAL | Status: AC | PRN
  Administered 2016-04-25: 4 mg via ORAL
  Filled 2016-04-22: qty 2

## 2016-04-22 MED ORDER — ONDANSETRON 4 MG PO TBDP
4.0000 mg | ORAL_TABLET | Freq: Four times a day (QID) | ORAL | Status: AC | PRN
  Administered 2016-04-22 – 2016-04-25 (×3): 4 mg via ORAL
  Filled 2016-04-22 (×3): qty 1

## 2016-04-22 MED ORDER — HYDROXYZINE HCL 25 MG PO TABS: 25.0000 mg | ORAL_TABLET | Freq: Four times a day (QID) | ORAL | Status: AC | PRN

## 2016-04-22 MED ORDER — PNEUMOCOCCAL VAC POLYVALENT 25 MCG/0.5ML IJ INJ: 0.5000 mL | INJECTION | INTRAMUSCULAR | Status: DC

## 2016-04-22 MED ORDER — IBUPROFEN 600 MG PO TABS
600.0000 mg | ORAL_TABLET | Freq: Four times a day (QID) | ORAL | Status: DC | PRN
  Administered 2016-04-24 – 2016-04-25 (×2): 600 mg via ORAL
  Filled 2016-04-22 (×2): qty 1

## 2016-04-22 MED ORDER — MAGNESIUM HYDROXIDE 400 MG/5ML PO SUSP
30.0000 mL | Freq: Every day | ORAL | Status: DC | PRN
Start: 2016-04-22 — End: 2016-04-27

## 2016-04-22 MED ORDER — ADULT MULTIVITAMIN W/MINERALS CH
1.0000 | ORAL_TABLET | Freq: Every day | ORAL | Status: DC
  Administered 2016-04-22 – 2016-04-27 (×6): 1 via ORAL
  Filled 2016-04-22 (×8): qty 1

## 2016-04-22 MED ORDER — ALUM & MAG HYDROXIDE-SIMETH 200-200-20 MG/5ML PO SUSP: 30.0000 mL | ORAL | Status: DC | PRN

## 2016-04-22 MED ORDER — QUETIAPINE FUMARATE 100 MG PO TABS
100.0000 mg | ORAL_TABLET | Freq: Every day | ORAL | Status: DC
  Administered 2016-04-22 – 2016-04-26 (×5): 100 mg via ORAL
  Filled 2016-04-22 (×7): qty 1

## 2016-04-22 MED ORDER — QUETIAPINE FUMARATE 50 MG PO TABS
50.0000 mg | ORAL_TABLET | Freq: Two times a day (BID) | ORAL | Status: DC
  Administered 2016-04-22 – 2016-04-27 (×10): 50 mg via ORAL
  Filled 2016-04-22 (×4): qty 1
  Filled 2016-04-22 (×2): qty 28
  Filled 2016-04-22 (×4): qty 1
  Filled 2016-04-22: qty 28
  Filled 2016-04-22 (×4): qty 1
  Filled 2016-04-22: qty 28
  Filled 2016-04-22: qty 1

## 2016-04-22 MED ORDER — LORAZEPAM 1 MG PO TABS: 1.0000 mg | ORAL_TABLET | Freq: Four times a day (QID) | ORAL | Status: AC | PRN

## 2016-04-22 MED ORDER — INFLUENZA VAC SPLIT QUAD 0.5 ML IM SUSY
0.5000 mL | PREFILLED_SYRINGE | INTRAMUSCULAR | Status: DC
  Filled 2016-04-22: qty 0.5

## 2016-04-22 MED ORDER — VITAMIN B-1 100 MG PO TABS
100.0000 mg | ORAL_TABLET | Freq: Every day | ORAL | Status: DC
  Administered 2016-04-23 – 2016-04-27 (×5): 100 mg via ORAL
  Filled 2016-04-22 (×6): qty 1

## 2016-04-22 MED ORDER — IBUPROFEN 200 MG PO TABS
600.0000 mg | ORAL_TABLET | Freq: Once | ORAL | Status: AC
  Administered 2016-04-22: 600 mg via ORAL
  Filled 2016-04-22: qty 3

## 2016-04-22 MED ORDER — LORAZEPAM 1 MG PO TABS
1.0000 mg | ORAL_TABLET | Freq: Four times a day (QID) | ORAL | Status: AC
Start: 2016-04-22 — End: 2016-04-23
  Administered 2016-04-22 – 2016-04-23 (×6): 1 mg via ORAL
  Filled 2016-04-22 (×5): qty 1

## 2016-04-22 MED ORDER — LORAZEPAM 1 MG PO TABS
1.0000 mg | ORAL_TABLET | Freq: Two times a day (BID) | ORAL | Status: AC
  Administered 2016-04-25 (×2): 1 mg via ORAL
  Filled 2016-04-22 (×2): qty 1

## 2016-04-22 MED ORDER — AMLODIPINE BESYLATE 5 MG PO TABS
5.0000 mg | ORAL_TABLET | Freq: Every day | ORAL | Status: DC
  Administered 2016-04-23 – 2016-04-27 (×5): 5 mg via ORAL
  Filled 2016-04-22: qty 1
  Filled 2016-04-22: qty 7
  Filled 2016-04-22 (×5): qty 1

## 2016-04-22 MED ORDER — LORAZEPAM 1 MG PO TABS
1.0000 mg | ORAL_TABLET | Freq: Every day | ORAL | Status: AC
  Administered 2016-04-26: 1 mg via ORAL
  Filled 2016-04-22: qty 1

## 2016-04-22 MED ORDER — THIAMINE HCL 100 MG/ML IJ SOLN: 100.0000 mg | Freq: Once | INTRAMUSCULAR | Status: DC
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
## 2016-04-22 NOTE — BH Assessment (Signed)
Echo Assessment Progress Note  Per Corena Pilgrim, MD, this pt requires psychiatric hospitalization.  Ria Comment, RN, [facility] has assigned pt to [facility] Rm [id number] they will be ready to receive pt at 14:30.  Pt presents under IVC initiated by xxx, and upheld by xxx, and IVC documents have been faxed to [facility].  Pt's nurse, xxx, has been notified, and agrees to call report to [phone].  Pt is to be transported via Event [facility].   Jalene Mullet, [place] Triage Specialist [phone]

## 2016-04-22 NOTE — ED Notes (Signed)
[facility] on unit to transfer pt to [facility] Adult unit per MD order. Pt behavior calm and cooperative. Personal property given to [employer] for transfer. Transfer paperwork given to [employer]. Pt ambulatory off unit with [employer].

## 2016-04-22 NOTE — Tx Team (Signed)
Initial Treatment Plan 04/22/2016 3:42 PM [name] [id number]    PATIENT STRESSORS: Financial difficulties Legal issue Medication change or noncompliance Substance abuse   PATIENT STRENGTHS: Average or above average intelligence Communication skills Motivation for treatment/growth Physical Health Supportive family/friends   PATIENT IDENTIFIED PROBLEMS: At risk for suicide  Substance Abuse  "Getting into somewhere long term"  "Triggers"               DISCHARGE CRITERIA:  Ability to meet basic life and health needs Improved stabilization in mood, thinking, and/or behavior Motivation to continue treatment in a less acute level of care Need for constant or close observation no longer present Verbal commitment to aftercare and medication compliance Withdrawal symptoms are absent or subacute and managed without 24-hour nursing intervention  PRELIMINARY DISCHARGE PLAN: Attend 12-step recovery group Outpatient therapy Return to previous living arrangement  PATIENT/FAMILY INVOLVEMENT: This treatment plan has been presented to and reviewed with the patient, [name].  The patient and family have been given the opportunity to ask questions and make suggestions.  Dustin Flock, RN 04/22/2016, 3:42 PM

## 2016-04-22 NOTE — ED Notes (Signed)
Manus Gunning NP consulted in regards to pt temperature and resulted urinalysis. No new orders at this time.
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
## 2016-04-22 NOTE — Consult Note (Signed)
[facility] Consult   Reason for Consult:  Psychiatric evaluation Referring Physician:  EDP [name] MRN:  [id number] Principal Diagnosis: PTSD (post-traumatic stress disorder) Diagnosis:   Patient Active Problem List   Diagnosis Date Noted  . Insomnia [G47.00]   . HTN (hypertension), benign [I10]   . Severe episode of recurrent major depressive disorder, without psychotic features ([place]) [F33.2]   . Major depressive disorder, recurrent episode, severe, with psychotic behavior ([place]) [F33.3] 12/30/2015  . Depression, major, severe recurrence ([place]) [F33.2] 12/30/2015  . Substance induced mood disorder ([place]) [F19.94] 12/02/2015  . Mood disorder in conditions classified elsewhere [F06.30]   . MDD (major depressive disorder), recurrent severe, without psychosis ([place]) [F33.2] 10/08/2015  . Malnutrition of moderate degree [E44.0] 09/24/2015  . Alcohol withdrawal ([place]) [F10.239] 09/23/2015  . Tobacco use disorder [F17.200] 07/16/2015  . Elevated transaminase level [R74.0]   . Drug overdose, intentional ([place]) [T50.902A] 07/12/2015  . Alcohol use disorder, severe, dependence ([place]) [F10.20] 04/15/2015  . Cocaine abuse with cocaine-induced mood disorder ([place]) [F14.14] 04/11/2015  . Overdose [T50.901A] 04/10/2015  . Severe recurrent major depressive disorder with psychotic features ([place]) [F33.3]   . Severe major depression with psychotic features ([place]) [F32.3] 09/11/2014  . Alcohol-induced mood disorder ([place]) [F10.94] 09/10/2014  . Suicidal ideation [R45.851]   . Tylenol overdose [T39.1X1A]   . Polysubstance abuse [F19.10]   . Overdose of acetaminophen [T39.1X1A] 08/03/2014  . Overdose by acetaminophen [T39.1X1A] 08/03/2014  . Cocaine abuse [F14.10] 04/16/2014  . Thrombocytopenia ([place]) [D69.6] 04/15/2014  . Urinary tract infection, site not specified [N39.0] 04/15/2014  . Chest pain, unspecified [R07.9] 04/15/2014  . D-dimer, elevated [R79.89]  04/15/2014  . Transaminitis [R74.0] 09/24/2013  . Cocaine dependence ([place]) [F14.20] 09/20/2013  . 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 Seizure ([place]) [R56.9] 03/15/2013  . Syncope [R55] 01/02/2013  . Leukocytopenia, unspecified [D72.819] 01/02/2013  . Left kidney mass [N28.89] 12/24/2012  . PTSD (post-traumatic stress disorder) [F43.10] 07/06/2012  . Peripheral vascular disease ([place]) [I73.9] 01/14/2012  . SEIZURE DISORDER [R56.9] 10/03/2008  . LIVER FUNCTION TESTS, ABNORMAL [R94.5] 12/29/2007  . HYPERCHOLESTEROLEMIA [E78.00] 03/21/2007  . Essential hypertension [I10] 03/21/2007    Total Time spent with patient: 45 minutes  Subjective:   [name] is a 35 y.o. male patient who states "I've been suicidal for 3 days."  HPI:  Per [name] therapeutic triage assessment, Patient states he started feeling suicidal today. He denies any precipitating events. He has history of suicide attempts in the past. He states he did drink large amount of alcohol today and admits to doing cocaine. He states he did call the crisis center himself, who took him to [facility] and placed them under involuntary commitment. Patient with recent admission for the same. Patient is also complaining of upper abdominal pain and urinating blood.  Patient says that he contacted his probation officer and told him he felt like killing himself.  Officer took him to [employer].  Patient reports having blood in urine over the last 3 days.  [facility] initiated IVC papers and patient was brought to [facility].  Patient currently says he has plans to overdose on pills to kill himself.  Patient cannot currently contract for safety.  Pt has had two previous incidents of trying to overdose to kill self.  Patient denies HI or A/V hallucinations.  Patient says he drinks a half gallon of wine per day.  Has drank to day.  Patient has BAL of 303.  He told this clinician that he  did not use cocaine yet he is positive for it on  UDS.  Patient has been going to [facility] for outpatient care for years.  He also has been to [facility] in June, May, March of this year.  [facility] Evaluation: Patient is seen face-to-face today with Dr. Darleene [name].  He states that he has been suicidal for the past 3 days. He cannot identify any trigger for his suicidality. He states he has had "42" prior suicide attempts. He states his last attempt was 6 months ago. He currently lives with his parents. He went to [facility] and was IVC'd after telling his counselor that he wanted to cut his wrists.  Today, he continues to endorse suicidal ideation. He endorses auditory hallucinations with voices telling me to kill myself. He states he only sees demons when he is using drugs and alcohol. He denies any visual hallucinations at this time. He reports last use of cocaine and alcohol was last night. His urine drug screen is positive for cocaine. His blood alcohol level on admission was 303. He states his longest period of sobriety was 8 months and that was 5 years ago. He states he is followed by [name]; last appointment being 4 months ago.   Past Psychiatric History: PTSD, bipolar disorder, schizophrenia  Risk to Self: Suicidal Ideation: Yes-Currently Present Suicidal Intent: Yes-Currently Present Is patient at risk for suicide?: Yes Suicidal Plan?: Yes-Currently Present Specify Current Suicidal Plan: Overdose on pills Access to Means: Yes Specify Access to Suicidal Means: Has medications. What has been your use of drugs/alcohol within the last 12 months?: ETOH daily How many times?: 2 Other Self Harm Risks: Yes Triggers for Past Attempts: Unpredictable Intentional Self Injurious Behavior: Cutting Comment - Self Injurious Behavior: Last incident of cutting was last year Risk to Others: Homicidal Ideation: No Thoughts of Harm to Others: No Current Homicidal Intent: No Current Homicidal Plan: No Access to Homicidal Means: No Identified Victim:  None History of harm to others?: No Assessment of Violence: None Noted Violent Behavior Description: Pt denies Does patient have access to weapons?: No Criminal Charges Pending?: No Does patient have a court date: Yes Court Date:  ("sometime in November") Prior Inpatient Therapy: Prior Inpatient Therapy: Yes Prior Therapy Dates: 06/17. 05/17, 03/17 Prior Therapy Facilty/Provider(s): [facility] Reason for Treatment: SI, SA Prior Outpatient Therapy: Prior Outpatient Therapy: Yes Prior Therapy Dates: Past 15 years Prior Therapy Facilty/Provider(s): [facility], [facility] Reason for Treatment: med management Does patient have an ACCT team?: No Does patient have Intensive In-House Services?  : No Does patient have [facility] services? : No Does patient have P4CC services?: No  Past Medical History:  Past Medical History:  Diagnosis Date  . Angina   . Bipolar 1 disorder ([place])   . Breast CA ([place]) dx'd 2009   bil w/ bil masectomy and oral meds  . Cancer of kidney ([place]) dx'd 04/2013   lt nephrectomy  . Coronary artery disease   . Depression   . H/O suicide attempt 2015   overdose  . Headache(784.0)   . Hypercholesteremia   . Hypertension   . Liver cirrhosis ([place])   . Pancreatitis   . Peripheral vascular disease [facility]) April 2011   Left Pop  . Schizophrenia ([place])   . Seizures ([place])   . Shortness of breath     Past Surgical History:  Procedure Laterality Date  . BREAST SURGERY    . CHEST SURGERY    . left kidney removal    . left  leg surgery    . MASTECTOMY     Family History:  Family History  Problem Relation Age of Onset  . Stroke Other   . Cancer Other   . Hyperlipidemia Mother   . Hypertension Mother    Family Psychiatric  History: unknown Social History:  History  Alcohol Use  . 0.0 oz/week     History  Drug Use No    Comment: former    Social History   Social History  . Marital status: Single    Spouse name: N/A  . Number of children: N/A  . Years of education: N/A    Social History Main Topics  . Smoking status: Current Every Day Smoker  . Smokeless tobacco: Never Used  . Alcohol use 0.0 oz/week  . Drug use: No     Comment: former  . Sexual activity: Yes    Birth control/ protection: Condom     Comment: anal   Other Topics Concern  . None   Social History Narrative   ** Merged History Encounter **       Additional Social History:    Allergies:   Allergies  Allergen Reactions  . Codeine Hives, Itching and Swelling  . Penicillins Swelling    Has patient had a PCN reaction causing immediate rash, facial/tongue/throat swelling, SOB or lightheadedness with hypotension: Yes Has patient had a PCN reaction causing severe rash involving mucus membranes or skin necrosis: Yes Has patient had a PCN reaction that required hospitalization Yes-ed visit Has patient had a PCN reaction occurring within the last 10 years: Yes If all of the above answers are "NO", then may proceed with Cephalosporin use.   . Depakote [Divalproex Sodium] Hives and Other (See Comments)    "Bug out and hallucinate"  . Morphine Itching  . Coconut Flavor Itching and Swelling  . Depakote Er [Divalproex Sodium Er] Hives  . Oxycodone Itching and Swelling  . Hydrocodone-Acetaminophen Itching and Rash    Labs:  Results for orders placed or performed during the hospital encounter of 04/21/16 (from the past 48 hour(s))  CBG monitoring, ED     Status: Abnormal   Collection Time: 04/21/16  5:16 PM  Result Value Ref Range   Glucose-Capillary 124 (H) 65 - 99 mg/dL  Rapid urine drug screen (hospital performed)     Status: Abnormal   Collection Time: 04/21/16  5:44 PM  Result Value Ref Range   Opiates NONE DETECTED NONE DETECTED   Cocaine POSITIVE (A) NONE DETECTED   Benzodiazepines NONE DETECTED NONE DETECTED   Amphetamines NONE DETECTED NONE DETECTED   Tetrahydrocannabinol NONE DETECTED NONE DETECTED   Barbiturates NONE DETECTED NONE DETECTED    Comment:        DRUG  SCREEN FOR MEDICAL PURPOSES ONLY.  IF CONFIRMATION IS NEEDED FOR ANY PURPOSE, NOTIFY LAB WITHIN 5 DAYS.        LOWEST DETECTABLE LIMITS FOR URINE DRUG SCREEN Drug Class       Cutoff (ng/mL) Amphetamine      1000 Barbiturate      200 Benzodiazepine   350 Tricyclics       093 Opiates          300 Cocaine          300 THC              50   Urinalysis, Routine w reflex microscopic (not at [facility])     Status: Abnormal   Collection Time: 04/21/16  5:44 PM  Result Value Ref Range   Color, Urine YELLOW YELLOW   APPearance CLEAR CLEAR   Specific Gravity, Urine 1.011 1.005 - 1.030   pH 5.5 5.0 - 8.0   Glucose, UA NEGATIVE NEGATIVE mg/dL   Hgb urine dipstick LARGE (A) NEGATIVE   Bilirubin Urine NEGATIVE NEGATIVE   Ketones, ur NEGATIVE NEGATIVE mg/dL   Protein, ur NEGATIVE NEGATIVE mg/dL   Nitrite NEGATIVE NEGATIVE   Leukocytes, UA NEGATIVE NEGATIVE  Urine microscopic-add on     Status: Abnormal   Collection Time: 04/21/16  5:44 PM  Result Value Ref Range   Squamous Epithelial / LPF 0-5 (A) NONE SEEN   WBC, UA 0-5 0 - 5 WBC/hpf   RBC / HPF 6-30 0 - 5 RBC/hpf   Bacteria, UA RARE (A) NONE SEEN  Comprehensive metabolic panel     Status: Abnormal   Collection Time: 04/21/16  5:54 PM  Result Value Ref Range   Sodium 141 135 - 145 mmol/L   Potassium 3.9 3.5 - 5.1 mmol/L   Chloride 106 101 - 111 mmol/L   CO2 27 22 - 32 mmol/L   Glucose, Bld 109 (H) 65 - 99 mg/dL   BUN 12 6 - 20 mg/dL   Creatinine, Ser 1.08 0.61 - 1.24 mg/dL   Calcium 8.9 8.9 - 10.3 mg/dL   Total Protein 7.5 6.5 - 8.1 g/dL   Albumin 4.4 3.5 - 5.0 g/dL   AST 35 15 - 41 U/L   ALT 23 17 - 63 U/L   Alkaline Phosphatase 45 38 - 126 U/L   Total Bilirubin 0.5 0.3 - 1.2 mg/dL   GFR calc non Af Amer >60 >60 mL/min   GFR calc Af Amer >60 >60 mL/min    Comment: (NOTE) The eGFR has been calculated using the CKD EPI equation. This calculation has not been validated in all clinical situations. eGFR's persistently <60 mL/min  signify possible Chronic Kidney Disease.    Anion gap 8 5 - 15  Ethanol     Status: Abnormal   Collection Time: 04/21/16  5:54 PM  Result Value Ref Range   Alcohol, Ethyl (B) 303 (HH) <5 mg/dL    Comment:        LOWEST DETECTABLE LIMIT FOR SERUM ALCOHOL IS 5 mg/dL FOR MEDICAL PURPOSES ONLY CRITICAL RESULT CALLED TO, READ BACK BY AND VERIFIED WITH: HAMBY,M AT 1831 ON 10.4.17 BY MOSLEY,J   Salicylate level     Status: None   Collection Time: 04/21/16  5:54 PM  Result Value Ref Range   Salicylate Lvl <3.7 2.8 - 30.0 mg/dL  Acetaminophen level     Status: Abnormal   Collection Time: 04/21/16  5:54 PM  Result Value Ref Range   Acetaminophen (Tylenol), Serum <10 (L) 10 - 30 ug/mL    Comment:        THERAPEUTIC CONCENTRATIONS VARY SIGNIFICANTLY. A RANGE OF 10-30 ug/mL MAY BE AN EFFECTIVE CONCENTRATION FOR MANY PATIENTS. HOWEVER, SOME ARE BEST TREATED AT CONCENTRATIONS OUTSIDE THIS RANGE. ACETAMINOPHEN CONCENTRATIONS >150 ug/mL AT 4 HOURS AFTER INGESTION AND >50 ug/mL AT 12 HOURS AFTER INGESTION ARE OFTEN ASSOCIATED WITH TOXIC REACTIONS.   cbc     Status: None   Collection Time: 04/21/16  5:54 PM  Result Value Ref Range   WBC 6.0 4.0 - 10.5 K/uL   RBC 5.09 4.22 - 5.81 MIL/uL   Hemoglobin 14.8 13.0 - 17.0 g/dL   HCT 42.6 39.0 - 52.0 %   MCV 83.7 78.0 - 100.0 fL  MCH 29.1 26.0 - 34.0 pg   MCHC 34.7 30.0 - 36.0 g/dL   RDW 13.8 11.5 - 15.5 %   Platelets 240 150 - 400 K/uL  Lipase, blood     Status: None   Collection Time: 04/21/16  5:54 PM  Result Value Ref Range   Lipase 23 11 - 51 U/L    Current Facility-Administered Medications  Medication Dose Route Frequency Provider Last Rate Last Dose  . alum & mag hydroxide-simeth (MAALOX/MYLANTA) 200-200-20 MG/5ML suspension 30 mL  30 mL Oral PRN Tatyana Kirichenko, PA-C      . amLODipine (NORVASC) tablet 5 mg  5 mg Oral Daily Orpah Greek, MD   5 mg at 04/22/16 1008  . atorvastatin (LIPITOR) tablet 20 mg  20 mg Oral  Daily Orpah Greek, MD      . gi cocktail (Maalox,Lidocaine,Donnatal)  30 mL Oral TID PRN Tatyana Kirichenko, PA-C      . hydrOXYzine (ATARAX/VISTARIL) tablet 25 mg  25 mg Oral Q6H PRN Orpah Greek, MD      . LORazepam (ATIVAN) tablet 0-4 mg  0-4 mg Oral Q6H Tatyana Kirichenko, PA-C   2 mg at 04/21/16 2316   Followed by  . [START ON 04/23/2016] LORazepam (ATIVAN) tablet 0-4 mg  0-4 mg Oral Q12H Tatyana Kirichenko, PA-C      . metoprolol tartrate (LOPRESSOR) tablet 12.5 mg  12.5 mg Oral BID Orpah Greek, MD   12.5 mg at 04/22/16 1009  . nicotine (NICODERM CQ - dosed in mg/24 hours) patch 21 mg  21 mg Transdermal Daily Tatyana Kirichenko, PA-C      . ondansetron (ZOFRAN) tablet 4 mg  4 mg Oral Q8H PRN Tatyana Kirichenko, PA-C   4 mg at 04/22/16 0859  . QUEtiapine (SEROQUEL) tablet 25 mg  25 mg Oral BID Orpah Greek, MD   25 mg at 04/22/16 0859  . QUEtiapine (SEROQUEL) tablet 50 mg  50 mg Oral QHS Orpah Greek, MD   50 mg at 04/21/16 2316  . thiamine (VITAMIN B-1) tablet 100 mg  100 mg Oral Daily Tatyana Kirichenko, PA-C   100 mg at 04/22/16 6834   Or  . thiamine (B-1) injection 100 mg  100 mg Intravenous Daily Tatyana Kirichenko, PA-C      . traZODone (DESYREL) tablet 300 mg  300 mg Oral QHS Orpah Greek, MD   300 mg at 04/21/16 2315   Current Outpatient Prescriptions  Medication Sig Dispense Refill  . amLODipine (NORVASC) 5 MG tablet Take 1 tablet (5 mg total) by mouth daily. 30 tablet 0  . atorvastatin (LIPITOR) 20 MG tablet Take 1 tablet (20 mg total) by mouth daily. 30 tablet 0  . hydrOXYzine (ATARAX/VISTARIL) 25 MG tablet Take 1 tablet (25 mg total) by mouth every 6 (six) hours as needed for anxiety. 30 tablet 0  . metoprolol tartrate (LOPRESSOR) 25 MG tablet Take 0.5 tablets (12.5 mg total) by mouth 2 (two) times daily. 30 tablet 0  . QUEtiapine (SEROQUEL) 25 MG tablet Take 1 tablet (25 mg total) by mouth 2 (two) times daily. 60 tablet 0   . QUEtiapine (SEROQUEL) 50 MG tablet Take 1 tablet (50 mg total) by mouth at bedtime. 30 tablet 0    Musculoskeletal: Strength & Muscle Tone: within normal limits Gait & Station: normal Patient leans: N/A  Psychiatric Specialty Exam: Physical Exam  Constitutional: He is oriented to person, place, and time. He appears well-developed and well-nourished.  HENT:  Head:  Normocephalic.  Neck: Normal range of motion.  Respiratory: Effort normal.  Musculoskeletal: Normal range of motion.  Neurological: He is alert and oriented to person, place, and time.  Skin: Skin is warm and dry.  Psychiatric: His speech is normal. He expresses suicidal ideation. He expresses no homicidal ideation.    Review of Systems  Constitutional: Negative.   HENT: Negative.   Eyes: Negative.   Respiratory: Negative.   Cardiovascular: Negative.   Gastrointestinal: Negative.   Genitourinary: Negative.   Musculoskeletal: Negative.   Skin: Negative.   Neurological: Negative.   Endo/Heme/Allergies: Negative.   Psychiatric/Behavioral: Positive for depression, substance abuse and suicidal ideas.    Blood pressure (!) 101/54, pulse 91, temperature 99.6 F (37.6 C), temperature source Oral, resp. rate 18, SpO2 95 %.There is no height or weight on file to calculate BMI.  General Appearance: Fairly Groomed  Eye Contact:  Good  Speech:  Clear and Coherent and Normal Rate  Volume:  Normal  Mood:  Depressed  Affect:  Depressed  Thought Process:  Coherent and Goal Directed  Orientation:  Full (Time, Place, and Person)  Thought Content:  Logical  Suicidal Thoughts:  Yes.  without intent/plan  Homicidal Thoughts:  No  Memory:  Immediate;   Good Recent;   Fair Remote;   Fair  Judgement:  Fair  Insight:  Fair  Psychomotor Activity:  Normal  Concentration:  Concentration: Fair and Attention Span: Fair  Recall:  [employer] of Knowledge:  Good  Language:  Good  Akathisia:  No  Handed:  Right  AIMS (if  indicated):     Assets:  Communication Skills Desire for Improvement Housing Physical Health Resilience  ADL's:  Intact  Cognition:  WNL  Sleep:       Case discussed with Dr. Darleene [name]; recommendations are: Treatment Plan Summary: Daily contact with patient to assess and evaluate symptoms and progress in treatment and Medication management  Continue home medications Vistaril 25 mg by mouth every 6 hours as needed for anxiety. Seroquel 25 mg by mouth twice daily for mood stabilization. Seroquel 50 mg by mouth at bedtime for mood stabilization and insomnia.  Disposition: Recommend psychiatric Inpatient admission when medically cleared.  Serena Colonel, FNP-BC [place] 04/22/2016 1:07 PM  Patient seen face-to-face for psychiatric evaluation, chart reviewed and case discussed with the physician extender and developed treatment plan. Reviewed the information documented and agree with the treatment plan. Corena Pilgrim, MD
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
## 2016-04-22 NOTE — ED Provider Notes (Signed)
9:39 AM  Nursing called report this patient was having a headache. Patient was assessed by me and reported that he usually takes ibuprofen for setting. He avoids Tylenol because of his prior liver problems.  Recent laboratory testing was reviewed and it appears his kidney function is within normal limits. Felt patient was appropriate to receive a dose of ibuprofen for his headache. Patient denied any focal neurologic deficits.  Another concern by nursing was that the patient had hematuria. Patient has a history of kidney cancer and nephrectomy. Review of urinalysis showed large blood but no evidence of nitrites, leukocytes, and urine was clear. Urinalysis showed some epithelial cells and rare bacteria. Given lack of dysuria and findings, feel patient has no urinary tract infection. Patient was informed he is to follow-up with his urology team for further management of hematuria. Patient denies flank pain or abdominal pain, doubt kidney stone.  Patient remains medically cleared for further psychiatric management.   Courtney Paris, MD 04/22/16 [phone]

## 2016-04-22 NOTE — ED Notes (Signed)
EDP consulted d/t pt c/o HA and resulted urinalysis.

## 2016-04-22 NOTE — ED Notes (Signed)
CIWA assessment deferred d/t pt is currently sleeping.  

## 2016-04-22 NOTE — Progress Notes (Signed)
D: Pt denies HI/AVH. Pt is pleasant and cooperative. Pt goal for today is to work on getting in long term treatment. Patient with SI thoughts but contracts for safety. A: Pt was offered support and encouragement.  Pt was encourage to attend groups. Q 15 minute checks were done for safety.  R: Pt is taking medication. Pt has no complaints.Pt receptive to treatment and safety maintained on unit.

## 2016-04-23 NOTE — [facility] Group Notes (Signed)
[facility] LCSW Group Therapy  04/23/2016 3:18 PM  Type of Therapy:  Group Therapy  Participation Level: Did not attend  Modes of Intervention:  Discussion, Education, Socialization and Support  Summary of Progress/Problems:Feelings around Relapse. Group members discussed the meaning of relapse and shared personal stories of relapse, how it affected them and others, and how they perceived themselves during this time. Group members were encouraged to identify triggers, warning signs and coping skills used when facing the possibility of relapse. Social supports were discussed and explored in detail..    [place] MSW, LCSWA  04/23/2016, 3:18 PM

## 2016-04-23 NOTE — Progress Notes (Signed)
Recreation Therapy Notes  Date: 04/23/16 Time: 0930 Location: [address] Dayroom  Group Topic: Stress Management  Goal Area(s) Addresses:  Patient will verbalize importance of using healthy stress management.  Patient will identify positive emotions associated with healthy stress management.   Intervention: Stress Management  Activity :  [employer].  LRT introduced the technique of guided imagery.  LRT read script allowing patients to participate and engage in the technique.  Patients were to follow along as LRT read script.  Education:  Stress Management, Discharge Planning.   Education Outcome: Acknowledges edcuation/In group clarification offered/Needs additional education  Clinical Observations/Feedback: Pt did not attend group.     Victorino Sparrow, LRT/CTRS         Victorino Sparrow A 04/23/2016 12:03 PM
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
## 2016-04-23 NOTE — Progress Notes (Signed)
Patient ID: [name], male   DOB: 1981-05-26, 35 y.o.   MRN: [id number]  CSW faxed referral to [facility] per request of pt. Information about other programs were provided to pt.   [place] MSW, [place]  04/23/2016 3:25 PM

## 2016-04-23 NOTE — Progress Notes (Signed)
Did not attend group 

## 2016-04-23 NOTE — Progress Notes (Addendum)
Patient A/O, no noted distress. Denies HI/AVH/pain. Patient contracted to safety verbally. Patient noted he is tired and want to rest. He continues to feel SI, resulted in financial and living arrangements. Staff will continue to monitor, meet needs, and maintain safety. Patient noted he would like to take the flu and pna  Vaccine the day of discharge.

## 2016-04-23 NOTE — BHH Suicide Risk Assessment (Signed)
[facility] INPATIENT:  Family/Significant Other Suicide Prevention Education  Suicide Prevention Education:  Patient Refusal for Family/Significant Other Suicide Prevention Education: The patient [name] has refused to provide written consent for family/significant other to be provided Family/Significant Other Suicide Prevention Education during admission and/or prior to discharge.  Physician notified.  Bo Mcclintock 04/23/2016, 9:12 AM

## 2016-04-24 DIAGNOSIS — Z8249 Family history of ischemic heart disease and other diseases of the circulatory system: Secondary | ICD-10-CM

## 2016-04-24 DIAGNOSIS — F1020 Alcohol dependence, uncomplicated: Secondary | ICD-10-CM

## 2016-04-24 DIAGNOSIS — R45851 Suicidal ideations: Secondary | ICD-10-CM

## 2016-04-24 DIAGNOSIS — Z808 Family history of malignant neoplasm of other organs or systems: Secondary | ICD-10-CM

## 2016-04-24 DIAGNOSIS — Z823 Family history of stroke: Secondary | ICD-10-CM

## 2016-04-24 DIAGNOSIS — F17210 Nicotine dependence, cigarettes, uncomplicated: Secondary | ICD-10-CM

## 2016-04-24 NOTE — Progress Notes (Signed)
Pt was invited to attend group; however, pt was in bed sleeping.

## 2016-04-24 NOTE — Progress Notes (Signed)
[facility] MD Progress Note  04/24/2016 4:34 PM [name]  MRN:  [id number] Subjective: Patient reports " I just completed an application for long term treatment.'" -patient is interested in residential treatment.  Objective:[name] is awake, alert and oriented X4. Seen resting in bed. Per staff notes pt is isolative and guarded. Patient reports suicidal ideation however is able to contract for safety. Denies  homicidal ideation. Denies auditory or visual hallucination and does not appear to be responding to internal stimuli. Patient reports he is medication compliant without mediation side effects. Support, encouragement and reassurance was provided.   Principal Problem: Severe alcohol use disorder ([place]) Diagnosis:   Patient Active Problem List   Diagnosis Date Noted  . Severe alcohol use disorder ([place]) [F10.20] 04/22/2016  . Insomnia [G47.00]   . HTN (hypertension), benign [I10]   . Severe episode of recurrent major depressive disorder, without psychotic features ([name]) [F33.2]   . Major depressive disorder, recurrent episode, severe, with psychotic behavior ([place]) [F33.3] 12/30/2015  . Depression, major, severe recurrence ([place]) [F33.2] 12/30/2015  . Substance induced mood disorder ([place]) [F19.94] 12/02/2015  . Mood disorder in conditions classified elsewhere [F06.30]   . MDD (major depressive disorder), recurrent severe, without psychosis ([place]) [F33.2] 10/08/2015  . Malnutrition of moderate degree [E44.0] 09/24/2015  . Alcohol withdrawal ([place]) [F10.239] 09/23/2015  . Tobacco use disorder [F17.200] 07/16/2015  . Elevated transaminase level [R74.0]   . Drug overdose, intentional ([place]) [T50.902A] 07/12/2015  . Alcohol use disorder, severe, dependence ([place]) [F10.20] 04/15/2015  . Cocaine abuse with cocaine-induced mood disorder ([place]) [F14.14] 04/11/2015  . Overdose [T50.901A] 04/10/2015  . Severe recurrent major depressive disorder with psychotic features ([place]) [F33.3]   . Severe major  depression with psychotic features ([place]) [F32.3] 09/11/2014  . Alcohol-induced mood disorder ([place]) [F10.94] 09/10/2014  . Suicidal ideation [R45.851]   . Tylenol overdose [T39.1X1A]   . Polysubstance abuse [F19.10]   . Overdose of acetaminophen [T39.1X1A] 08/03/2014  . Overdose by acetaminophen [T39.1X1A] 08/03/2014  . Cocaine abuse [F14.10] 04/16/2014  . Thrombocytopenia ([place]) [D69.6] 04/15/2014  . Urinary tract infection, site not specified [N39.0] 04/15/2014  . Chest pain, unspecified [R07.9] 04/15/2014  . D-dimer, elevated [R79.89] 04/15/2014  . Transaminitis [R74.0] 09/24/2013  . Cocaine dependence ([place]) [F14.20] 09/20/2013  . 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 Seizure ([place]) [R56.9] 03/15/2013  . Syncope [R55] 01/02/2013  . Leukocytopenia, unspecified [D72.819] 01/02/2013  . Left kidney mass [N28.89] 12/24/2012  . PTSD (post-traumatic stress disorder) [F43.10] 07/06/2012  . Peripheral vascular disease ([place]) [I73.9] 01/14/2012  . SEIZURE DISORDER [R56.9] 10/03/2008  . LIVER FUNCTION TESTS, ABNORMAL [R94.5] 12/29/2007  . HYPERCHOLESTEROLEMIA [E78.00] 03/21/2007  . Essential hypertension [I10] 03/21/2007   Total Time spent with patient: 30 minutes  Past Psychiatric History: See Above  Past Medical History:  Past Medical History:  Diagnosis Date  . Angina   . Bipolar 1 disorder ([place])   . Breast CA ([place]) dx'd 2009   bil w/ bil masectomy and oral meds  . Cancer of kidney ([place]) dx'd 04/2013   lt nephrectomy  . Coronary artery disease   . Depression   . H/O suicide attempt 2015   overdose  . Headache(784.0)   . Hypercholesteremia   . Hypertension   . Liver cirrhosis ([place])   . Pancreatitis   . Peripheral vascular disease [facility]) April 2011   Left Pop  . Schizophrenia ([place])   . Seizures ([place])   . Shortness of breath     Past Surgical History:  Procedure  Laterality Date  . BREAST SURGERY    . CHEST SURGERY    . left kidney removal    . left leg surgery    .  MASTECTOMY     Family History:  Family History  Problem Relation Age of Onset  . Stroke Other   . Cancer Other   . Hyperlipidemia Mother   . Hypertension Mother    Family Psychiatric  History: See above Social History:  History  Alcohol Use  . 0.0 oz/week     History  Drug Use No    Comment: former    Social History   Social History  . Marital status: Single    Spouse name: N/A  . Number of children: N/A  . Years of education: N/A   Social History Main Topics  . Smoking status: Current Every Day Smoker  . Smokeless tobacco: Never Used  . Alcohol use 0.0 oz/week  . Drug use: No     Comment: former  . Sexual activity: Yes    Birth control/ protection: Condom     Comment: anal   Other Topics Concern  . None   Social History Narrative   ** Merged History Encounter **       Additional Social History:                         Sleep: Fair  Appetite:  Good  Current Medications: Current Facility-Administered Medications  Medication Dose Route Frequency Provider Last Rate Last Dose  . alum & mag hydroxide-simeth (MAALOX/MYLANTA) 200-200-20 MG/5ML suspension 30 mL  30 mL Oral Q4H PRN Linard Millers, MD      . amLODipine [facility]) tablet 5 mg  5 mg Oral Daily Linard Millers, MD   5 mg at 04/24/16 0917  . atorvastatin (LIPITOR) tablet 20 mg  20 mg Oral q1800 Linard Millers, MD   20 mg at 04/24/16 1603  . hydrOXYzine (ATARAX/VISTARIL) tablet 25 mg  25 mg Oral Q6H PRN Linard Millers, MD      . ibuprofen (ADVIL,MOTRIN) tablet 600 mg  600 mg Oral Q6H PRN Jenne Campus, MD   600 mg at 04/24/16 1355  . Influenza vac split quadrivalent PF (FLUARIX) injection 0.5 mL  0.5 mL Intramuscular Tomorrow-1000 Linard Millers, MD   Stopped at 04/23/16 1000  . loperamide (IMODIUM) capsule 2-4 mg  2-4 mg Oral PRN Linard Millers, MD      . LORazepam (ATIVAN) tablet 1 mg  1 mg Oral Q6H PRN Linard Millers, MD      . Derrill Memo ON  04/25/2016] LORazepam (ATIVAN) tablet 1 mg  1 mg Oral BID Linard Millers, MD       Followed by  . [START ON 04/26/2016] LORazepam (ATIVAN) tablet 1 mg  1 mg Oral Daily Linard Millers, MD      . magnesium hydroxide (MILK OF MAGNESIA) suspension 30 mL  30 mL Oral Daily PRN Linard Millers, MD      . multivitamin with minerals tablet 1 tablet  1 tablet Oral Daily Linard Millers, MD   1 tablet at 04/24/16 [id number]  . ondansetron (ZOFRAN-ODT) disintegrating tablet 4 mg  4 mg Oral Q6H PRN Linard Millers, MD   4 mg at 04/24/16 1355  . pneumococcal 23 valent vaccine (PNU-IMMUNE) injection 0.5 mL  0.5 mL Intramuscular Tomorrow-1000 Linard Millers, MD   Stopped at 04/23/16 1000  . QUEtiapine (SEROQUEL) tablet  100 mg  100 mg Oral QHS Linard Millers, MD   100 mg at 04/23/16 2127  . QUEtiapine (SEROQUEL) tablet 50 mg  50 mg Oral BID Linard Millers, MD   50 mg at 04/24/16 1603  . thiamine (B-1) injection 100 mg  100 mg Intramuscular Once Linard Millers, MD      . thiamine (VITAMIN B-1) tablet 100 mg  100 mg Oral Daily Linard Millers, MD   100 mg at 04/24/16 [id number]    Lab Results: No results found for this or any previous visit (from the past 48 hour(s)).  Blood Alcohol level:  Lab Results  Component Value Date   ETH 303 (HH) 04/21/2016   ETH 129 (H) [id number]    Metabolic Disorder Labs: Lab Results  Component Value Date   HGBA1C 5.3 09/11/2014   MPG 105 09/11/2014   MPG 100 12/24/2012   No results found for: PROLACTIN Lab Results  Component Value Date   CHOL 321 (H) 09/11/2014   TRIG 134 07/15/2015   HDL 77 09/11/2014   CHOLHDL 4.2 09/11/2014   VLDL 15 09/11/2014   LDLCALC 229 (H) 09/11/2014   LDLCALC 181 (H) 04/16/2014    Physical Findings: AIMS: Facial and Oral Movements Muscles of Facial Expression: None, normal Lips and Perioral Area: None, normal Jaw: None, normal Tongue: None, normal,Extremity Movements Upper (arms,  wrists, hands, fingers): None, normal Lower (legs, knees, ankles, toes): None, normal, Trunk Movements Neck, shoulders, hips: None, normal, Overall Severity Severity of abnormal movements (highest score from questions above): None, normal Incapacitation due to abnormal movements: None, normal [name] awareness of abnormal movements (rate only patient's report): No Awareness, Dental Status Current problems with teeth and/or dentures?: No Does patient usually wear dentures?: No  CIWA:  CIWA-Ar Total: 0 COWS:     Musculoskeletal: Strength & Muscle Tone: within normal limits Gait & Station: normal Patient leans: N/A  Psychiatric Specialty Exam: Physical Exam  Vitals reviewed. Constitutional: He is oriented to person, place, and time. He appears well-developed.  Cardiovascular: Normal rate.   Neurological: He is alert and oriented to person, place, and time.  Psychiatric: He has a normal mood and affect.    Review of Systems  Psychiatric/Behavioral: Positive for depression, substance abuse and suicidal ideas. The patient is nervous/anxious and has insomnia.     Blood pressure 108/61, pulse 88, temperature 97.6 F (36.4 C), resp. rate 18, height 5\' 6"  (1.676 m), weight 71.2 kg (157 lb).Body mass index is 25.34 kg/m.  General Appearance: Guarded  Eye Contact:  Fair  Speech:  Clear and Coherent  Volume:  Decreased  Mood:  Depressed and Dysphoric  Affect:  Blunt and Congruent  Thought Process:  Coherent  Orientation:  Full (Time, Place, and Person)  Thought Content:  Hallucinations: None  Suicidal Thoughts:  Yes.  with intent/plan  Homicidal Thoughts:  No  Memory:  Immediate;   Fair Recent;   Fair Remote;   Fair  Judgement:  Fair  Insight:  Lacking  Psychomotor Activity:  Restlessness  Concentration:  Concentration: Poor  Recall:  [employer] of Knowledge:  Fair  Language:  Good  Akathisia:  No  Handed:  Right  AIMS (if indicated):     Assets:  Communication  Skills Desire for Improvement Social Support Transportation  ADL's:  Intact  Cognition:  WNL  Sleep:  Number of Hours: 6.75     I agree with current treatment plan on 04/24/2016, Patient seen face-to-face for psychiatric evaluation  follow-up, chart reviewed. Reviewed the information documented and agree with the treatment plan.  Treatment Plan Summary: Daily contact with patient to assess and evaluate symptoms and progress in treatment and Medication management   Continue with Seroquel 100 mg and 50 mg  for mood stabilization. Continue with Trazodone 100 mg for insomnia Started on CWIA/AtivanProtocol Will continue to monitor vitals ,medication compliance and treatment side effects while patient is here.  CSW will start working on disposition.  Patient to participate in therapeutic milieu   [name], NP 04/24/2016, 4:34 PM    Agree with NP progress note as above

## 2016-04-24 NOTE — Progress Notes (Signed)
Patient ID: [name], male   DOB: 04-15-1981, 35 y.o.   MRN: [id number]     D: Pt has been very flat and depressed on the unit today. Pt was also very isolative, he remained in the bed most of the day. Pt reported on his self inventory sheet that his depression was a 9, his hopelessness was a 9, and his anxiety was a 9. Pt reported that he was positive SI, and was able to contract for safety. Pt reported being negative HI, no AH/VH noted. Pt requested to see a SW due to filling out applications for treatment after discharge, he was seen by SW.  A: 15 min checks continued for patient safety. R: Pt safety maintained.

## 2016-04-24 NOTE — Progress Notes (Signed)
[name] is observed by Probation officer OOB UAL on the 300 hall today. He tolerates this well. He is pleasant, makes good eye contact and smiles and is engaging in conversation with Probation officer, takes his scheduled meds as planned and completes his daily assessment as requested to. A ON assessment, he writes he has experienced suicidal ideation ( but he verbally contracts to not hurt slef when Probation officer asks him about this feeling). He rates his depression, hopelessness anda xneity " 9/9/9/", repsectively. R Safety  In place. Cont to foster therapeutic relationship.

## 2016-04-24 NOTE — Progress Notes (Signed)
D: Pt denies SI/HI/AVH. Pt is pleasant and cooperative. Pt goal for today is to work on getting out of room more. A: Pt was offered support and encouragement. Pt was given scheduled medications. Pt was encourage to attend groups. Q 15 minute checks were done for safety.  R: Pt is taking medication. Pt has no complaints.Pt receptive to treatment and safety maintained on unit.

## 2016-04-24 NOTE — Progress Notes (Signed)
Patient ID: [name], male   DOB: Sep 19, 1980, 35 y.o.   MRN: [id number]  D: Patient pleasant on approach tonight. Affect brightens when speaking to him. Reports feeling shaky and sweating but symptoms are minimal tonight and subjective complaints. Ativan protocol continues. Reports increased depression recently and admits to being off medication prior to coming to hospital. Still continues to reports passive SI but contracts on unit. A: Staff will monitor on q 15 minute checks, follow treatment plan, and give meds as ordered R: Cooperative on the unit.

## 2016-04-24 NOTE — BHH Group Notes (Signed)
[facility] Group Notes: (Clinical Social Work)   04/24/2016      Type of Therapy:  Group Therapy   Participation Level:  Did Not Attend despite MHT prompting   Selmer Dominion, LCSW 04/24/2016, 11:07 AM

## 2016-04-25 DIAGNOSIS — Z79899 Other long term (current) drug therapy: Secondary | ICD-10-CM

## 2016-04-25 DIAGNOSIS — F332 Major depressive disorder, recurrent severe without psychotic features: Secondary | ICD-10-CM

## 2016-04-25 DIAGNOSIS — F1414 Cocaine abuse with cocaine-induced mood disorder: Secondary | ICD-10-CM

## 2016-04-25 MED ORDER — PROMETHAZINE HCL 25 MG/ML IJ SOLN
12.5000 mg | Freq: Once | INTRAMUSCULAR | Status: AC
  Administered 2016-04-25: 12.5 mg via INTRAMUSCULAR
  Filled 2016-04-25 (×2): qty 1

## 2016-04-25 MED ORDER — PROMETHAZINE HCL 25 MG/ML IJ SOLN
12.5000 mg | Freq: Once | INTRAMUSCULAR | Status: DC
  Filled 2016-04-25: qty 1

## 2016-04-25 NOTE — Plan of Care (Deleted)
Problem: Safety: Goal: Ability to remain free from injury will improve Outcome: Progressing Patient has not engaged in self harm. Denies SI.  Problem: Coping: Goal: Ability to cope will improve Outcome: Not Progressing Patient remains isolative to bed, room. Not participating in unit activities.

## 2016-04-25 NOTE — BHH Group Notes (Signed)
The focus of this group is to educate the patient on the purpose and policies of crisis stabilization and provide a format to answer questions about their admission.  The group details unit policies and expectations of patients while admitted.  Patient did not attend 0900 nurse education orientation group this morning.  Patient stayed in bed.   

## 2016-04-25 NOTE — Progress Notes (Signed)
Patient has had difficult day today with detox, withdrawal symptoms. Nausea, vomiting and diarrhea. Prn's provided and temporary relief achieved however patient began to vomit numerous times again this afternoon. NP contacted, order received to admin phenergan 12.5mg  IM which was given without difficulty. On reassess, patient able to take sips of ginger ale and po meds. Will advance cautiously should patient's status remain stable. He verbalizes understanding. Remains safe.

## 2016-04-25 NOTE — Progress Notes (Signed)
[facility] MD Progress Note  04/25/2016 1:19 PM [name]  MRN:  [id number] Subjective: Patient reports " I feel better just feeling a little nauseous today for some reason."  Objective: Pt seen and chart reviewed. Pt is alert/oriented x4, calm, cooperative, and appropriate to situation. Pt denies suicidal/homicidal ideation and psychosis and does not appear to be responding to internal stimuli. Pt states that he feels better in terms of anxiety and depression but reports that he does not feel ready to go any time soon. He reports mild to moderate nausea, somewhat relieved by Zofran, later requiring a low dose IM phenergan.   Principal Problem: Alcohol use disorder, severe, dependence ([place]) Diagnosis:   Patient Active Problem List   Diagnosis Date Noted  . Alcohol use disorder, severe, dependence ([place]) [F10.20] 04/15/2015    Priority: High  . Cocaine abuse with cocaine-induced mood disorder [facility]) [F14.14] 04/11/2015    Priority: High  . Suicidal ideation [R45.851]     Priority: High  . MDD (major depressive disorder), recurrent severe, without psychosis ([place]) [F33.2] 10/08/2015    Priority: Medium  . Cocaine abuse [F14.10] 04/16/2014    Priority: Medium  . Insomnia [G47.00]   . HTN (hypertension), benign [I10]   . Severe episode of recurrent major depressive disorder, without psychotic features ([place]) [F33.2]   . Major depressive disorder, recurrent episode, severe, with psychotic behavior ([place]) [F33.3] 12/30/2015  . Depression, major, severe recurrence ([place]) [F33.2] 12/30/2015  . Substance induced mood disorder ([place]) [F19.94] 12/02/2015  . Mood disorder in conditions classified elsewhere [F06.30]   . Malnutrition of moderate degree [E44.0] 09/24/2015  . Alcohol withdrawal ([place]) [F10.239] 09/23/2015  . Tobacco use disorder [F17.200] 07/16/2015  . Elevated transaminase level [R74.0]   . Drug overdose, intentional ([place]) [T50.902A] 07/12/2015  . Overdose [T50.901A] 04/10/2015  . Severe  recurrent major depressive disorder with psychotic features ([place]) [F33.3]   . Severe major depression with psychotic features ([place]) [F32.3] 09/11/2014  . Alcohol-induced mood disorder ([place]) [F10.94] 09/10/2014  . Tylenol overdose [T39.1X1A]   . Polysubstance abuse [F19.10]   . Overdose of acetaminophen [T39.1X1A] 08/03/2014  . Overdose by acetaminophen [T39.1X1A] 08/03/2014  . Thrombocytopenia ([place]) [D69.6] 04/15/2014  . Urinary tract infection, site not specified [N39.0] 04/15/2014  . Chest pain, unspecified [R07.9] 04/15/2014  . D-dimer, elevated [R79.89] 04/15/2014  . Transaminitis [R74.0] 09/24/2013  . Cocaine dependence ([name]) [F14.20] 09/20/2013  . 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 Seizure ([place]) [R56.9] 03/15/2013  . Syncope [R55] 01/02/2013  . Leukocytopenia, unspecified [D72.819] 01/02/2013  . Left kidney mass [N28.89] 12/24/2012  . PTSD (post-traumatic stress disorder) [F43.10] 07/06/2012  . Peripheral vascular disease ([place]) [I73.9] 01/14/2012  . SEIZURE DISORDER [R56.9] 10/03/2008  . LIVER FUNCTION TESTS, ABNORMAL [R94.5] 12/29/2007  . HYPERCHOLESTEROLEMIA [E78.00] 03/21/2007  . Essential hypertension [I10] 03/21/2007   Total Time spent with patient: 30 minutes  Past Psychiatric History: See Above  Past Medical History:  Past Medical History:  Diagnosis Date  . Angina   . Bipolar 1 disorder ([place])   . Breast CA ([place]) dx'd 2009   bil w/ bil masectomy and oral meds  . Cancer of kidney ([place]) dx'd 04/2013   lt nephrectomy  . Coronary artery disease   . Depression   . H/O suicide attempt 2015   overdose  . Headache(784.0)   . Hypercholesteremia   . Hypertension   . Liver cirrhosis ([place])   . Pancreatitis   . Peripheral vascular disease [facility]) April 2011   Left Pop  .  Schizophrenia ([place])   . Seizures ([place])   . Shortness of breath     Past Surgical History:  Procedure Laterality Date  . BREAST SURGERY    . CHEST SURGERY    . left kidney removal    . left  leg surgery    . MASTECTOMY     Family History:  Family History  Problem Relation Age of Onset  . Stroke Other   . Cancer Other   . Hyperlipidemia Mother   . Hypertension Mother    Family Psychiatric  History: See above Social History:  History  Alcohol Use  . 0.0 oz/week     History  Drug Use No    Comment: former    Social History   Social History  . Marital status: Single    Spouse name: N/A  . Number of children: N/A  . Years of education: N/A   Social History Main Topics  . Smoking status: Current Every Day Smoker  . Smokeless tobacco: Never Used  . Alcohol use 0.0 oz/week  . Drug use: No     Comment: former  . Sexual activity: Yes    Birth control/ protection: Condom     Comment: anal   Other Topics Concern  . None   Social History Narrative   ** Merged History Encounter **       Additional Social History:                         Sleep: Fair  Appetite:  Good  Current Medications: Current Facility-Administered Medications  Medication Dose Route Frequency Provider Last Rate Last Dose  . alum & mag hydroxide-simeth (MAALOX/MYLANTA) 200-200-20 MG/5ML suspension 30 mL  30 mL Oral Q4H PRN Linard Millers, MD      . amLODipine [facility]) tablet 5 mg  5 mg Oral Daily Linard Millers, MD   5 mg at 04/25/16 0825  . atorvastatin (LIPITOR) tablet 20 mg  20 mg Oral q1800 Linard Millers, MD   20 mg at 04/24/16 1603  . hydrOXYzine (ATARAX/VISTARIL) tablet 25 mg  25 mg Oral Q6H PRN Linard Millers, MD      . ibuprofen (ADVIL,MOTRIN) tablet 600 mg  600 mg Oral Q6H PRN Jenne Campus, MD   600 mg at 04/25/16 1005  . Influenza vac split quadrivalent PF (FLUARIX) injection 0.5 mL  0.5 mL Intramuscular Tomorrow-1000 Linard Millers, MD   Stopped at 04/23/16 1000  . loperamide (IMODIUM) capsule 2-4 mg  2-4 mg Oral PRN Linard Millers, MD   4 mg at 04/25/16 1005  . LORazepam (ATIVAN) tablet 1 mg  1 mg Oral Q6H PRN Linard Millers, MD      . LORazepam (ATIVAN) tablet 1 mg  1 mg Oral BID Linard Millers, MD   1 mg at 04/25/16 0825   Followed by  . [START ON 04/26/2016] LORazepam (ATIVAN) tablet 1 mg  1 mg Oral Daily Linard Millers, MD      . magnesium hydroxide (MILK OF MAGNESIA) suspension 30 mL  30 mL Oral Daily PRN Linard Millers, MD      . multivitamin with minerals tablet 1 tablet  1 tablet Oral Daily Linard Millers, MD   1 tablet at 04/25/16 0825  . ondansetron (ZOFRAN-ODT) disintegrating tablet 4 mg  4 mg Oral Q6H PRN Linard Millers, MD   4 mg at 04/24/16 1355  . pneumococcal 23 valent  vaccine (PNU-IMMUNE) injection 0.5 mL  0.5 mL Intramuscular Tomorrow-1000 Linard Millers, MD   Stopped at 04/23/16 1000  . QUEtiapine (SEROQUEL) tablet 100 mg  100 mg Oral QHS Linard Millers, MD   100 mg at 04/24/16 2150  . QUEtiapine (SEROQUEL) tablet 50 mg  50 mg Oral BID Linard Millers, MD   50 mg at 04/25/16 0825  . thiamine (B-1) injection 100 mg  100 mg Intramuscular Once Linard Millers, MD      . thiamine (VITAMIN B-1) tablet 100 mg  100 mg Oral Daily Linard Millers, MD   100 mg at 04/25/16 0825    Lab Results: No results found for this or any previous visit (from the past 87 hour(s)).  Blood Alcohol level:  Lab Results  Component Value Date   ETH 303 (HH) 04/21/2016   ETH 129 (H) [id number]    Metabolic Disorder Labs: Lab Results  Component Value Date   HGBA1C 5.3 09/11/2014   MPG 105 09/11/2014   MPG 100 12/24/2012   No results found for: PROLACTIN Lab Results  Component Value Date   CHOL 321 (H) 09/11/2014   TRIG 134 07/15/2015   HDL 77 09/11/2014   CHOLHDL 4.2 09/11/2014   VLDL 15 09/11/2014   LDLCALC 229 (H) 09/11/2014   LDLCALC 181 (H) 04/16/2014    Physical Findings: AIMS: Facial and Oral Movements Muscles of Facial Expression: None, normal Lips and Perioral Area: None, normal Jaw: None, normal Tongue: None,  normal,Extremity Movements Upper (arms, wrists, hands, fingers): None, normal Lower (legs, knees, ankles, toes): None, normal, Trunk Movements Neck, shoulders, hips: None, normal, Overall Severity Severity of abnormal movements (highest score from questions above): None, normal Incapacitation due to abnormal movements: None, normal [name] awareness of abnormal movements (rate only patient's report): No Awareness, Dental Status Current problems with teeth and/or dentures?: No Does patient usually wear dentures?: No  CIWA:  CIWA-Ar Total: 7 COWS:     Musculoskeletal: Strength & Muscle Tone: within normal limits Gait & Station: normal Patient leans: N/A  Psychiatric Specialty Exam: Physical Exam  Vitals reviewed. Constitutional: He is oriented to person, place, and time. He appears well-developed.  Cardiovascular: Normal rate.   Neurological: He is alert and oriented to person, place, and time.  Psychiatric: He has a normal mood and affect.    Review of Systems  Psychiatric/Behavioral: Positive for depression, substance abuse and suicidal ideas. The patient is nervous/anxious and has insomnia.     Blood pressure 128/86, pulse 94, temperature 97.6 F (36.4 C), resp. rate 18, height 5\' 6"  (1.676 m), weight 71.2 kg (157 lb).Body mass index is 25.34 kg/m.  General Appearance: Casual, fairly groomed  Eye Contact:  Fair  Speech:  Clear and Coherent  Volume:  Decreased  Mood:  Depressed   Affect:  Blunt and Congruent   Thought Process:  Coherent  Orientation:  Full (Time, Place, and Person)  Thought Content:  Hallucinations: None focused on nausea, outpatient resources upon discharge, mildly anxious  Suicidal Thoughts:  Yes, without intent/plan  Homicidal Thoughts:  No  Memory:  Immediate;   Fair Recent;   Fair Remote;   Fair  Judgement:  Fair  Insight:  Lacking  Psychomotor Activity:  Restlessness  Concentration:  Concentration: Poor  Recall:  [employer] of Knowledge:   Fair  Language:  Good  Akathisia:  No  Handed:  Right  AIMS (if indicated):     Assets:  Communication Skills Desire for Improvement Social  Support Transportation  ADL's:  Intact  Cognition:  WNL  Sleep:  Number of Hours: 6.75   Treatment Plan Summary: Alcohol use disorder, severe, dependence ([place]) with MDD, recurrent, moderate, managed as below:  I have reviewed and concur with treatment plan on 04/25/16, modified as follows:  -Continue with Seroquel 100 mg and 50 mg  for mood stabilization. -Continue with Trazodone 100 mg for insomnia -Phenergan 12.5mg  IM x1 for severe nausea/vomiting Started on CWIA/AtivanProtocol Will continue to monitor vitals ,medication compliance and treatment side effects while patient is here.  CSW will start working on disposition.  Patient to participate in therapeutic milieu   Benjamine Mola, FNP 04/25/2016, 1:19 PM    Agree with NP progress note as above

## 2016-04-25 NOTE — Progress Notes (Signed)
Patient did not attend the evening speaker AA meeting. Pt was notified that group was beginning but remained in bed.   

## 2016-04-25 NOTE — Progress Notes (Signed)
Patient has been resting in bed all morning. Complaining of withdrawal and CIWA is a "7" at noon, VSS. Chief complaints are a headache of a "7/10", ringing in ears and diarrhea. States this is common for him when he is withdrawing. Affect is flat, depressed and sad with congruent mood.   Medicated per orders. Advil and imodium given. Fluids encouraged and provided. Self inventory encouraged however patient states he is feeling too poorly at this time to complete it.  On reassess, patient is asleep. Denies HI however endorsing passive SI. No plan, intent voiced. Verbally contracts for safety while on unit. Remains safe on level III obs.

## 2016-04-25 NOTE — [facility] Group Notes (Signed)
[facility] Group Notes: (Clinical Social Work)   04/25/2016      Type of Therapy:  Group Therapy   Participation Level:  Did Not Attend despite MHT prompting   Selmer Dominion, LCSW 04/25/2016, 12:23 PM

## 2016-04-25 NOTE — Plan of Care (Signed)
Problem: Safety: Goal: Periods of time without injury will increase Outcome: Progressing Patient is endorsing passive SI however no self injury has occurred.   Problem: Medication: Goal: Compliance with prescribed medication regimen will improve Outcome: Progressing Patient has been med compliant.

## 2016-04-26 MED ORDER — ONDANSETRON 4 MG PO TBDP
4.0000 mg | ORAL_TABLET | Freq: Three times a day (TID) | ORAL | Status: DC | PRN
  Administered 2016-04-26: 4 mg via ORAL

## 2016-04-26 MED ORDER — ONDANSETRON 4 MG PO TBDP
ORAL_TABLET | ORAL | Status: AC
  Filled 2016-04-26: qty 1

## 2016-04-26 MED ORDER — MECLIZINE HCL 25 MG PO TABS
25.0000 mg | ORAL_TABLET | Freq: Three times a day (TID) | ORAL | Status: DC | PRN
  Administered 2016-04-26 (×2): 25 mg via ORAL
  Filled 2016-04-26 (×4): qty 1

## 2016-04-26 MED ORDER — HYDROXYZINE HCL 25 MG PO TABS
25.0000 mg | ORAL_TABLET | Freq: Four times a day (QID) | ORAL | Status: DC | PRN
  Administered 2016-04-26 – 2016-04-27 (×2): 25 mg via ORAL
  Filled 2016-04-26 (×3): qty 1

## 2016-04-26 NOTE — Progress Notes (Signed)
Recreation Therapy Notes  Date: 04/26/16 Time: 0930 Location: 300 Hall Group Room  Group Topic: Stress Management  Goal Area(s) Addresses:  Patient will verbalize importance of using healthy stress management.  Patient will identify positive emotions associated with healthy stress management.   Intervention: Guided Imagery  Activity :  Depression Imagery.  LRT introduced the technique of guided imagery to the patients.  LRT read a script allowing patients to participate in the activity.  Patients were to follow along as LRT read script.  Education:  Stress Management, Discharge Planning.   Education Outcome: Acknowledges edcuation/In group clarification offered/Needs additional education  Clinical Observations/Feedback: Pt did not attend group.     Victorino Sparrow, LRT/CTRS         Ria Comment, Lillard Bailon A 04/26/2016 11:59 AM

## 2016-04-26 NOTE — Progress Notes (Signed)
[facility] MD Progress Note  04/26/2016 2:47 PM  Patient Active Problem List   Diagnosis Date Noted  . Insomnia   . HTN (hypertension), benign   . Severe episode of recurrent major depressive disorder, without psychotic features ([place])   . Major depressive disorder, recurrent episode, severe, with psychotic behavior ([place]) 12/30/2015  . Depression, major, severe recurrence ([place]) 12/30/2015  . Substance induced mood disorder ([place]) 12/02/2015  . Mood disorder in conditions classified elsewhere   . MDD (major depressive disorder), recurrent severe, without psychosis ([place]) 10/08/2015  . Malnutrition of moderate degree 09/24/2015  . Alcohol withdrawal ([place]) 09/23/2015  . Tobacco use disorder 07/16/2015  . Elevated transaminase level   . Drug overdose, intentional ([place]) 07/12/2015  . Alcohol use disorder, severe, dependence ([place]) 04/15/2015  . Cocaine abuse with cocaine-induced mood disorder ([place]) 04/11/2015  . Overdose 04/10/2015  . Severe recurrent major depressive disorder with psychotic features ([place])   . Severe major depression with psychotic features ([place]) 09/11/2014  . Alcohol-induced mood disorder ([place]) 09/10/2014  . Suicidal ideation   . Tylenol overdose   . Polysubstance abuse   . Overdose of acetaminophen 08/03/2014  . Overdose by acetaminophen 08/03/2014  . Cocaine abuse 04/16/2014  . Thrombocytopenia ([place]) 04/15/2014  . Urinary tract infection, site not specified 04/15/2014  . Chest pain, unspecified 04/15/2014  . D-dimer, elevated 04/15/2014  . Transaminitis 09/24/2013  . Cocaine dependence ([place]) 09/20/2013  . S/p nephrectomy 04/28/2013  . Seizure ([place]) 03/15/2013  . Syncope 01/02/2013  . Leukocytopenia, unspecified 01/02/2013  . Left kidney mass 12/24/2012  . PTSD (post-traumatic stress disorder) 07/06/2012  . Peripheral vascular disease ([place]) 01/14/2012  . SEIZURE DISORDER 10/03/2008  . LIVER FUNCTION TESTS, ABNORMAL 12/29/2007  . HYPERCHOLESTEROLEMIA 03/21/2007  .  Essential hypertension 03/21/2007    Diagnosis: Alcohol use disorder, depression  Subjective: Patient reports he is doing fairly well except he has had an attack of vertigo the past 2 days. He reports he has had previous vertical and he describes symptoms consistent with attack of paroxysmal vertigo. He is also working on filling out some applications for long-term treatment programs.  Objective: Well developed well nourished male in no apparent distress pleasant and cooperative speech and motor appear within normal limits casually dressed and well-groomed thought content he denies acute plan to harm self or others at present. Thought processes appear linear and goal-directed mood is described as all right affect is congruent. Alert and oriented 3 judgment and insight are fair IQ appears an average range     Current Facility-Administered Medications (Cardiovascular):  .  amLODipine (NORVASC) tablet 5 mg .  atorvastatin (LIPITOR) tablet 20 mg     Current Facility-Administered Medications (Analgesics):  .  ibuprofen (ADVIL,MOTRIN) tablet 600 mg     Current Facility-Administered Medications (Other):  .  alum & mag hydroxide-simeth (MAALOX/MYLANTA) 200-200-20 MG/5ML suspension 30 mL .  Influenza vac split quadrivalent PF (FLUARIX) injection 0.5 mL .  magnesium hydroxide (MILK OF MAGNESIA) suspension 30 mL .  meclizine (ANTIVERT) tablet 25 mg .  multivitamin with minerals tablet 1 tablet .  ondansetron (ZOFRAN-ODT) disintegrating tablet 4 mg .  pneumococcal 23 valent vaccine (PNU-IMMUNE) injection 0.5 mL .  QUEtiapine (SEROQUEL) tablet 100 mg .  QUEtiapine (SEROQUEL) tablet 50 mg .  thiamine (B-1) injection 100 mg .  thiamine (VITAMIN B-1) tablet 100 mg  No current outpatient prescriptions on file.  Vital Signs:Blood pressure (!) 125/96, pulse (!) 106, temperature 98.6 F (37 C), temperature source Oral, resp. rate 16,  height 5\' 6"  (1.676 m), weight 71.2 kg (157 lb).    Lab  Results: No results found for this or any previous visit (from the past 48 hour(s)).  Physical Findings: AIMS: Facial and Oral Movements Muscles of Facial Expression: None, normal Lips and Perioral Area: None, normal Jaw: None, normal Tongue: None, normal,Extremity Movements Upper (arms, wrists, hands, fingers): None, normal Lower (legs, knees, ankles, toes): None, normal, Trunk Movements Neck, shoulders, hips: None, normal, Overall Severity Severity of abnormal movements (highest score from questions above): None, normal Incapacitation due to abnormal movements: None, normal [name] awareness of abnormal movements (rate only patient's report): No Awareness, Dental Status Current problems with teeth and/or dentures?: No Does patient usually wear dentures?: No  CIWA:  CIWA-Ar Total: 5 COWS:      Assessment/Plan: Patient appears to completed detoxification without incident. He does complain of an attack of vertical which she has had in the past. His Zofran will be renewed and Antivert will be ordered when necessary as well. We will continue to monitor for need for any other interventions. Patient is working on applications and will need a disposition perhaps prior to finally going to a long-term program as he stabilizes here on the ward.  Linard Millers, MD 04/26/2016, 2:47 PM

## 2016-04-26 NOTE — Progress Notes (Signed)
Writer spoke with patient 1:1 and he reports that he has had a difficult day. He reports vomiting and diarrhea most of the day. He reports that since being admitted here in the past that he has never went to long term facility and feels that he may need to go somewhere long term. Writer encouraged him to speak with his social worker about discharge plans. He has been isolative to his room. Support given and safety maintained on unit with 15 min checks.

## 2016-04-26 NOTE — Progress Notes (Signed)
D: Patient has been isolative to room, bed as he continues to experience withdrawal symptoms. Complaints include nausea (with vomiting over night, dizziness, fullness in head, unsteady gait, anxiety and harshness to light. Rates sleep as fair, appetite as fair, energy as normal (though patient lethargic on assessment) and concentration as poor. [name] affect flat, mood depressed. Rating depression at an 8/10, hopelessness at an 8/10 and anxiety at an 8/10. States goal for today is to "get to learn my coping skills" and "stay focused and less stressed." Denies pain, physical problems.   A: Medicated per orders, prn zofran and meclizine given for symptoms. Emotional support offered and self inventory reviewed. Encouraged completion of Suicide Safety Plan once symptoms improve. Discussed POC with MD, SW.  Fall precautions reviewed and in place.   R: Patient verbalizes understanding of POC, teaching. Reports decrease in nausea, dizziness. Patient denies HI however endorses passive SI. Verbally contracts for safety and remains safe on level III obs.

## 2016-04-26 NOTE — Plan of Care (Signed)
Problem: Medication: Goal: Compliance with prescribed medication regimen will improve Outcome: Progressing Patient was compliant with his scheduled hs medication.

## 2016-04-26 NOTE — Plan of Care (Signed)
Problem: Education: Goal: Verbalization of understanding the information provided will improve Outcome: Progressing Patient verbalizes understanding, receptive to teaching.  Problem: Coping: Goal: Ability to demonstrate self-control will improve Outcome: Progressing Patient is calm and cooperative.

## 2016-04-26 NOTE — Tx Team (Signed)
Interdisciplinary Treatment and Diagnostic Plan Update  04/26/2016 Time of Session: 9:30 AM [name] MRN: [id number]  Principal Diagnosis: Alcohol use disorder, severe, dependence ([place])  Secondary Diagnoses: Principal Problem:   Alcohol use disorder, severe, dependence ([place]) Active Problems:   Cocaine abuse with cocaine-induced mood disorder ([place])   MDD (major depressive disorder), recurrent severe, without psychosis ([place])   Current Medications:  Current Facility-Administered Medications  Medication Dose Route Frequency Provider Last Rate Last Dose  . alum & mag hydroxide-simeth (MAALOX/MYLANTA) 200-200-20 MG/5ML suspension 30 mL  30 mL Oral Q4H PRN Linard Millers, MD      . amLODipine [facility]) tablet 5 mg  5 mg Oral Daily Linard Millers, MD   5 mg at 04/26/16 [id number]  . atorvastatin (LIPITOR) tablet 20 mg  20 mg Oral q1800 Linard Millers, MD   20 mg at 04/25/16 1814  . ibuprofen (ADVIL,MOTRIN) tablet 600 mg  600 mg Oral Q6H PRN Jenne Campus, MD   600 mg at 04/25/16 1005  . Influenza vac split quadrivalent PF (FLUARIX) injection 0.5 mL  0.5 mL Intramuscular Tomorrow-1000 Linard Millers, MD   Stopped at 04/23/16 1000  . magnesium hydroxide (MILK OF MAGNESIA) suspension 30 mL  30 mL Oral Daily PRN Linard Millers, MD      . meclizine (ANTIVERT) tablet 25 mg  25 mg Oral TID PRN Linard Millers, MD   25 mg at 04/26/16 1036  . multivitamin with minerals tablet 1 tablet  1 tablet Oral Daily Linard Millers, MD   1 tablet at 04/26/16 [phone]  . ondansetron (ZOFRAN-ODT) disintegrating tablet 4 mg  4 mg Oral Q8H PRN Linard Millers, MD   4 mg at 04/26/16 [id number]  . pneumococcal 23 valent vaccine (PNU-IMMUNE) injection 0.5 mL  0.5 mL Intramuscular Tomorrow-1000 Linard Millers, MD   Stopped at 04/23/16 1000  . QUEtiapine (SEROQUEL) tablet 100 mg  100 mg Oral QHS Linard Millers, MD   100 mg at 04/25/16 2140  . QUEtiapine (SEROQUEL)  tablet 50 mg  50 mg Oral BID Linard Millers, MD   50 mg at 04/26/16 [id number]  . thiamine (B-1) injection 100 mg  100 mg Intramuscular Once Linard Millers, MD      . thiamine (VITAMIN B-1) tablet 100 mg  100 mg Oral Daily Linard Millers, MD   100 mg at 04/26/16 [id number]   PTA Medications: Prescriptions Prior to Admission  Medication Sig Dispense Refill Last Dose  . amLODipine (NORVASC) 5 MG tablet Take 1 tablet (5 mg total) by mouth daily. 30 tablet 0 3 months ago  . atorvastatin (LIPITOR) 20 MG tablet Take 1 tablet (20 mg total) by mouth daily. 30 tablet 0 3 months ago  . hydrOXYzine (ATARAX/VISTARIL) 25 MG tablet Take 1 tablet (25 mg total) by mouth every 6 (six) hours as needed for anxiety. 30 tablet 0 3 months ago  . metoprolol tartrate (LOPRESSOR) 25 MG tablet Take 0.5 tablets (12.5 mg total) by mouth 2 (two) times daily. 30 tablet 0 3 months ago  . QUEtiapine (SEROQUEL) 25 MG tablet Take 1 tablet (25 mg total) by mouth 2 (two) times daily. 60 tablet 0   . QUEtiapine (SEROQUEL) 50 MG tablet Take 1 tablet (50 mg total) by mouth at bedtime. 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   Physician Treatment Plan for Primary Diagnosis: Alcohol use disorder, severe, dependence ([place]) Long Term Goal(s): Improvement in symptoms so as ready for discharge Improvement in symptoms so as ready for discharge   Short Term Goals: Ability to disclose and discuss suicidal ideas Ability to demonstrate self-control will improve Ability to maintain clinical measurements within normal limits will  improve Compliance with prescribed medications will improve Ability to identify changes in lifestyle to reduce recurrence of condition will improve Ability to identify and develop effective coping behaviors will improve Ability to identify triggers associated with substance abuse/mental health issues will improve  Medication Management: Evaluate patient's response, side effects, and tolerance of medication regimen.  Therapeutic Interventions: 1 to 1 sessions, Unit Group sessions and Medication administration.  Evaluation of Outcomes: Progressing  Physician Treatment Plan for Secondary Diagnosis: Principal Problem:   Alcohol use disorder, severe, dependence ([place]) Active Problems:   Cocaine abuse with cocaine-induced mood disorder (H[facility])   MDD (major depressive disorder), recurrent severe, without psychosis ([place])  Long Term Goal(s): Improvement in symptoms so as ready for discharge Improvement in symptoms so as ready for discharge   Short Term Goals: Ability to disclose and discuss suicidal ideas Ability to demonstrate self-control will improve Ability to maintain clinical measurements within normal limits will improve Compliance with prescribed medications will improve Ability to identify changes in lifestyle to reduce recurrence of condition will improve Ability to identify and develop effective coping behaviors will improve Ability to identify triggers associated with substance abuse/mental health issues will improve     Medication Management: Evaluate patient's response, side effects, and tolerance of medication regimen.  Therapeutic Interventions: 1 to 1 sessions, Unit Group sessions and Medication administration.  Evaluation of Outcomes: Progressing   RN Treatment Plan for Primary Diagnosis: Alcohol use disorder, severe, dependence ([place]) Long Term Goal(s): Knowledge of disease and therapeutic regimen to maintain health will improve  Short Term Goals: Ability to disclose and  discuss suicidal ideas and Ability to identify and develop effective coping behaviors will improve  Medication Management: RN will administer medications as ordered by provider, will assess and evaluate patient's response and provide education to patient for prescribed medication. RN will report any adverse and/or side effects to prescribing provider.  Therapeutic Interventions: 1 on 1 counseling sessions, Psychoeducation, Medication administration, Evaluate responses to treatment, Monitor vital signs and CBGs as ordered, Perform/monitor CIWA, COWS, AIMS and Fall Risk screenings as ordered, Perform wound care treatments as ordered.  Evaluation of Outcomes: Progressing   LCSW Treatment Plan for Primary Diagnosis: Alcohol use disorder, severe, dependence ([place]) Long Term Goal(s): Safe transition to appropriate next level of care at discharge, Engage patient in therapeutic group addressing interpersonal concerns.  Short Term Goals: Engage patient in aftercare planning with referrals and resources, Facilitate patient progression through stages of change regarding substance use diagnoses and concerns and Identify triggers associated with mental health/substance abuse issues  Therapeutic Interventions: Assess for all discharge needs, 1 to 1 time with Social worker, Explore available resources and support systems, Assess for adequacy in community support network, Educate family and significant other(s) on suicide prevention, Complete Psychosocial Assessment, Interpersonal group therapy.  Evaluation of Outcomes: Progressing   Progress in Treatment: Attending groups: No. Participating in groups: No. Taking medication as prescribed: Yes. Toleration medication: Yes. Family/Significant other contact made: No, will contact:  patient refused consent for  collateral contact Patient understands diagnosis: Yes. Discussing patient identified problems/goals with staff: Yes. Medical problems stabilized or  resolved: Yes. Denies suicidal/homicidal ideation: No. and As evidenced by:  admitted w suicidal ideation, inadequate coping skills for community stressors Issues/concerns per patient self-inventory: No. Other: na  New problem(s) identified: No, Describe:  none at this time  New Short Term/Long Term Goal(s): identify appropriate community resources fpr subsatnce abuse treatment  Discharge Plan or Barriers: awaiting response from referrals  Reason for Continuation of Hospitalization: Depression Medication stabilization Suicidal ideation  Estimated Length of Stay: 3 - 5 days  Attendees: Patient: 04/26/2016 10:57 AM  Physician: Army Chaco MD 04/26/2016 10:57 AM  Nursing: Pauline Aus RN 04/26/2016 10:57 AM  RN Care Manager: 04/26/2016 10:57 AM  Social Worker: Eusebio Me LCSW 04/26/2016 10:57 AM  Recreational Therapist:  04/26/2016 10:57 AM  Other:  04/26/2016 10:57 AM  Other:  04/26/2016 10:57 AM  Other: 04/26/2016 10:57 AM    Scribe for Treatment Team: Beverely Pace, LCSW 04/26/2016 10:57 AM

## 2016-04-27 MED ORDER — HYDROXYZINE HCL 25 MG PO TABS: 25.0000 mg | ORAL_TABLET | Freq: Four times a day (QID) | ORAL | 0 refills | Status: DC | PRN

## 2016-04-27 MED ORDER — QUETIAPINE FUMARATE 100 MG PO TABS: 100.0000 mg | ORAL_TABLET | Freq: Every day | ORAL | 0 refills | Status: DC

## 2016-04-27 MED ORDER — QUETIAPINE FUMARATE 50 MG PO TABS: 50.0000 mg | ORAL_TABLET | Freq: Two times a day (BID) | ORAL | 0 refills | Status: DC

## 2016-04-27 MED ORDER — ATORVASTATIN CALCIUM 20 MG PO TABS: 20.0000 mg | ORAL_TABLET | Freq: Every day | ORAL | 0 refills | Status: DC

## 2016-04-27 MED ORDER — AMLODIPINE BESYLATE 5 MG PO TABS: 5.0000 mg | ORAL_TABLET | Freq: Every day | ORAL | 0 refills | Status: DC

## 2016-04-27 NOTE — [facility] Group Notes (Signed)
[facility] Group Notes:  (Nursing/MHT/Case Management/Adjunct)  Date:  04/27/2016  Time:  10:14 AM Type of Therapy:  Psychoeducational Skills  Participation Level:  Did Not Attend  Participation Quality:  DID NOT ATTEND  Affect:  DID NOT ATTEND  Cognitive:  DID NOT ATTEND  Insight:  None  Engagement in Group:  DID NOT ATTEND  Modes of Intervention:  DID NOT ATTEND  Summary of Progress/Problems: Pt did not attend patient self inventory group.   Jeanie Cooks Jacoby Zanni 04/27/2016, 10:14 AM

## 2016-04-27 NOTE — Progress Notes (Signed)
[facility] Group Notes:  (Nursing/MHT/Case Management/Adjunct)  Date:  04/27/2016  Time:  12:04 AM  Type of Therapy:  Psychoeducational Skills  Participation Level:  Active  Participation Quality:  Appropriate  Affect:  Appropriate  Cognitive:  Appropriate  Insight:  Good  Engagement in Group:  Engaged  Modes of Intervention:  Education  Summary of Progress/Problems: Patient states that he had a better day yesterday and that he was having fewer symptoms of vertigo. He anticipates being discharged on Tuesday. In terms of the theme for the day, his wellness strategy will be to attend all of his follow up appointments.   [name] 04/27/2016, 12:04 AM

## 2016-04-27 NOTE — Progress Notes (Signed)
  [facility] Adult Case Management Discharge Plan :  Will you be returning to the same living situation after discharge:  Yes,  returning home with his mother At discharge, do you have transportation home?: Yes,  mother will attempt to pick him up, however he may need a bus pass. Do you have the ability to pay for your medications: Yes,  no barriers, referral made  Release of information consent forms completed and in the chart;  [name] signature needed at discharge.  Patient to Follow up at: Follow-up Information    [facility] .   Specialty:  [facility] information: [address] [place] [address] [phone]        [facility]. Call today.   Why:  Follow up with your referral.  They will also be in touch with you when a bed is available. Contact information: [place], [place], [place] [address] [phone]       [facility]. Call today.   Why:  Referral has been sent for you.  Please call daily at 9am for bed.  Contact information: [address] [place], [place] [address] [phone]          Next level of care provider has access to [place] and Suicide Prevention discussed: Yes,  completed in groups or No.  Have you used any form of tobacco in the last 30 days? (Cigarettes, Smokeless Tobacco, Cigars, and/or Pipes): No  Has patient been referred to the Quitline?: Patient refused referral  Patient has been referred for addiction treatment: Yes  Lilly Cove 04/27/2016, 10:13 AM

## 2016-04-27 NOTE — Discharge Summary (Signed)
Physician Discharge Summary Note  Patient:  [name] is an 35 y.o., male MRN:  [id number] DOB:  [birth year] Patient phone:  [phone] (home)  Patient address:   [address] [place] [address],  Total Time spent with patient: 30 minutes  Date of Admission:  04/22/2016 Date of Discharge: 04/27/2016  Reason for Admission:  Intoxicated alcohol  Principal Problem: Alcohol use disorder, severe, dependence ([place]) Discharge Diagnoses: Patient Active Problem List   Diagnosis Date Noted  . Insomnia [G47.00]   . HTN (hypertension), benign [I10]   . Severe episode of recurrent major depressive disorder, without psychotic features ([place]) [F33.2]   . Major depressive disorder, recurrent episode, severe, with psychotic behavior ([place]) [F33.3] 12/30/2015  . Depression, major, severe recurrence ([place]) [F33.2] 12/30/2015  . Substance induced mood disorder ([place]) [F19.94] 12/02/2015  . Mood disorder in conditions classified elsewhere [F06.30]   . MDD (major depressive disorder), recurrent severe, without psychosis ([place]) [F33.2] 10/08/2015  . Malnutrition of moderate degree [E44.0] 09/24/2015  . Alcohol withdrawal ([place]) [F10.239] 09/23/2015  . Tobacco use disorder [F17.200] 07/16/2015  . Elevated transaminase level [R74.0]   . Drug overdose, intentional ([place]) [T50.902A] 07/12/2015  . Alcohol use disorder, severe, dependence ([place]) [F10.20] 04/15/2015  . Cocaine abuse with cocaine-induced mood disorder ([place]) [F14.14] 04/11/2015  . Overdose [T50.901A] 04/10/2015  . Severe recurrent major depressive disorder with psychotic features ([place]) [F33.3]   . Severe major depression with psychotic features ([place]) [F32.3] 09/11/2014  . Alcohol-induced mood disorder ([place]) [F10.94] 09/10/2014  . Suicidal ideation [R45.851]   . Tylenol overdose [T39.1X1A]   . Polysubstance abuse [F19.10]   . Overdose of acetaminophen [T39.1X1A] 08/03/2014  . Overdose by acetaminophen [T39.1X1A] 08/03/2014  . Cocaine  abuse [F14.10] 04/16/2014  . Thrombocytopenia ([place]) [D69.6] 04/15/2014  . Urinary tract infection, site not specified [N39.0] 04/15/2014  . Chest pain, unspecified [R07.9] 04/15/2014  . D-dimer, elevated [R79.89] 04/15/2014  . Transaminitis [R74.0] 09/24/2013  . Cocaine dependence ([place]) [F14.20] 09/20/2013  . 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 Seizure ([place]) [R56.9] 03/15/2013  . Syncope [R55] 01/02/2013  . Leukocytopenia, unspecified [D72.819] 01/02/2013  . Left kidney mass [N28.89] 12/24/2012  . PTSD (post-traumatic stress disorder) [F43.10] 07/06/2012  . Peripheral vascular disease ([place]) [I73.9] 01/14/2012  . SEIZURE DISORDER [R56.9] 10/03/2008  . LIVER FUNCTION TESTS, ABNORMAL [R94.5] 12/29/2007  . HYPERCHOLESTEROLEMIA [E78.00] 03/21/2007  . Essential hypertension [I10] 03/21/2007    Past Psychiatric History: see HPI  Past Medical History:  Past Medical History:  Diagnosis Date  . Angina   . Bipolar 1 disorder ([place])   . Breast CA ([place]) dx'd 2009   bil w/ bil masectomy and oral meds  . Cancer of kidney ([place]) dx'd 04/2013   lt nephrectomy  . Coronary artery disease   . Depression   . H/O suicide attempt 2015   overdose  . Headache(784.0)   . Hypercholesteremia   . Hypertension   . Liver cirrhosis ([place])   . Pancreatitis   . Peripheral vascular disease [facility]) April 2011   Left Pop  . Schizophrenia ([place])   . Seizures ([place])   . Shortness of breath     Past Surgical History:  Procedure Laterality Date  . BREAST SURGERY    . CHEST SURGERY    . left kidney removal    . left leg surgery    . MASTECTOMY     Family History:  Family History  Problem Relation Age of Onset  . Stroke Other   . Cancer Other   .  Hyperlipidemia Mother   . Hypertension Mother    Family Psychiatric  History: see HPI Social History:  History  Alcohol Use  . 0.0 oz/week     History  Drug Use No    Comment: former    Social History   Social History  . Marital status: Single     Spouse name: N/A  . Number of children: N/A  . Years of education: N/A   Social History Main Topics  . Smoking status: Current Every Day Smoker  . Smokeless tobacco: Never Used  . Alcohol use 0.0 oz/week  . Drug use: No     Comment: former  . Sexual activity: Yes    Birth control/ protection: Condom     Comment: anal   Other Topics Concern  . None   Social History Narrative   ** Merged History Encounter **        Hospital Course:  [name], 35 yo, came in IVC petitioned for suicidal ideations after he called crisis center and then after to [facility].  He has history of suicide attempts in the past. He stated he did drink large amount of alcohol today and admits to doing cocaine.    [name] was admitted for Alcohol use disorder, severe, dependence ([place]) and crisis management.  Patient was treated with medications with their indications listed below in detail under Medication List.  Medical problems were identified and treated as needed.  Home medications were restarted as appropriate.  Improvement was monitored by observation and [name] daily report of symptom reduction.  Emotional and mental status was monitored by daily self inventory reports completed by [name] and clinical staff.  Patient reported continued improvement, denied any new concerns.  Patient had been compliant on medications and denied side effects.  Support and encouragement was provided.    Patient encouraged to attend groups to help with recognizing triggers of emotional crises and de-stabilizations.  Patient encouraged to attend group to help identify the positive things in life that would help in dealing with feelings of loss, depression and unhealthy or abusive tendencies.         [name] was evaluated by the treatment team for stability and plans for continued recovery upon discharge.  Patient was offered further treatment options upon discharge including Residential, Intensive Outpatient and  Outpatient treatment. Patient will follow up with agency listed below for medication management and counseling.  Encouraged patient to maintain satisfactory support network and home environment.  Advised to adhere to medication compliance and outpatient treatment follow up.  Prescriptions provided.       Willys Wadding motivation was an integral factor for scheduling further treatment.  Employment, transportation, bed availability, health status, family support, and any pending legal issues were also considered during patient's hospital stay.  Upon completion of this admission the patient was both mentally and medically stable for discharge denying suicidal/homicidal ideation, auditory/visual/tactile hallucinations, delusional thoughts and paranoia.      Physical Findings: AIMS: Facial and Oral Movements Muscles of Facial Expression: None, normal Lips and Perioral Area: None, normal Jaw: None, normal Tongue: None, normal,Extremity Movements Upper (arms, wrists, hands, fingers): None, normal Lower (legs, knees, ankles, toes): None, normal, Trunk Movements Neck, shoulders, hips: None, normal, Overall Severity Severity of abnormal movements (highest score from questions above): None, normal Incapacitation due to abnormal movements: None, normal [name] awareness of abnormal movements (rate only patient's report): No Awareness, Dental Status Current problems with teeth and/or dentures?: No Does patient usually wear  dentures?: No  CIWA:  CIWA-Ar Total: 1 COWS:     Musculoskeletal: Strength & Muscle Tone: within normal limits Gait & Station: normal Patient leans: N/A  Psychiatric Specialty Exam: Physical Exam  Nursing note and vitals reviewed. Psychiatric: He has a normal mood and affect. His speech is normal and behavior is normal. Judgment and thought content normal. He is not agitated. Thought content is not paranoid. Cognition and memory are normal. He expresses no homicidal and no suicidal  ideation.    Review of Systems  Constitutional: Negative.   HENT: Negative.   Eyes: Negative.   Respiratory: Negative.   Cardiovascular: Negative.   Skin: Negative.   Neurological: Negative.   Endo/Heme/Allergies: Negative.   Psychiatric/Behavioral: Negative.  Negative for depression and suicidal ideas. The patient is not nervous/anxious.   All other systems reviewed and are negative.   Blood pressure (!) 114/93, pulse 85, temperature 97.7 F (36.5 C), temperature source Oral, resp. rate 16, height 5\' 6"  (1.676 m), weight 71.2 kg (157 lb).Body mass index is 25.34 kg/m.   Have you used any form of tobacco in the last 30 days? (Cigarettes, Smokeless Tobacco, Cigars, and/or Pipes): No  Has this patient used any form of tobacco in the last 30 days? (Cigarettes, Smokeless Tobacco, Cigars, and/or Pipes) Yes, N/A  Blood Alcohol level:  Lab Results  Component Value Date   ETH 303 (HH) 04/21/2016   ETH 129 (H) [id number]    Metabolic Disorder Labs:  Lab Results  Component Value Date   HGBA1C 5.3 09/11/2014   MPG 105 09/11/2014   MPG 100 12/24/2012   No results found for: PROLACTIN Lab Results  Component Value Date   CHOL 321 (H) 09/11/2014   TRIG 134 07/15/2015   HDL 77 09/11/2014   CHOLHDL 4.2 09/11/2014   VLDL 15 09/11/2014   LDLCALC 229 (H) 09/11/2014   LDLCALC 181 (H) 04/16/2014    See Psychiatric Specialty Exam and Suicide Risk Assessment completed by Attending Physician prior to discharge.  Discharge destination:  Home  Is patient on multiple antipsychotic therapies at discharge:  No   Has Patient had three or more failed trials of antipsychotic monotherapy by history:  No  Recommended Plan for Multiple Antipsychotic Therapies: NA     Medication List    STOP taking these medications   metoprolol tartrate 25 MG tablet Commonly known as:  LOPRESSOR     TAKE these medications     Indication  amLODipine 5 MG tablet Commonly known as:  NORVASC Take 1  tablet (5 mg total) by mouth daily. Start taking on:  04/28/2016  Indication:  High Blood Pressure Disorder   atorvastatin 20 MG tablet Commonly known as:  LIPITOR Take 1 tablet (20 mg total) by mouth daily at 6 PM. What changed:  when to take this  Indication:  High Amount of Triglycerides in the Blood   hydrOXYzine 25 MG tablet Commonly known as:  ATARAX/VISTARIL Take 1 tablet (25 mg total) by mouth every 6 (six) hours as needed for anxiety.  Indication:  Anxiety Neurosis   QUEtiapine 100 MG tablet Commonly known as:  SEROQUEL Take 1 tablet (100 mg total) by mouth at bedtime. What changed:  medication strength  how much to take  when to take this  Indication:  mood stabilization   QUEtiapine 50 MG tablet Commonly known as:  SEROQUEL Take 1 tablet (50 mg total) by mouth 2 (two) times daily. What changed:  when to take this  Indication:  mood  stabilization      Follow-up Information    [facility] Follow up in 1 week(s).   Specialty:  Behavioral Health Why:  [facility] - hours are Monday - Friday from 8:30 - 3 PM.  Go within one week of discharge Contact information: [address] [place] [address] [phone]        [facility]. Call today.   Why:  Follow up with your referral.  They will also be in touch with you when a bed is available. Contact information: [place], [place], [place] [address] [phone]       [facility]. Call today.   Why:  Referral has been sent for you.  Please call daily at 9am for bed.  Contact information: [address] [place], [place] [address] [phone]          Follow-up recommendations:  Activity:  as tol Diet:  as tol  Comments:  1.  Take all your medications as prescribed.   2.  Report any adverse side effects to outpatient provider. 3.  Patient instructed to not use alcohol or illegal drugs while on prescription medicines. 4.  In the event of worsening symptoms,  instructed patient to call 911, the crisis hotline or go to nearest emergency room for evaluation of symptoms.  Signed: Janett Labella, NP [facility] 04/27/2016, 4:12 PM

## 2016-04-27 NOTE — Progress Notes (Signed)
Discharge note:  Patient discharged home per MD order.  Patient received all personal belongings from locker and unit.  Patient will follow up with [facility] and [facility].  Reviewed AVS/transition record with patient and he indicated understanding.  He denies any thoughts of self harm.  He received medication samples and prescriptions.  Patient left ambulatory with a bus pass.

## 2016-04-27 NOTE — Progress Notes (Signed)
D: Pt was in the day room upon initial approach.  Pt has depressed affect and mood.  Pt's goal is "to go home."  Pt reports he is discharging tomorrow and he feels safe to do so.  Pt denies SI/HI, denies hallucinations, denies pain.  Pt has been visible in milieu interacting with peers and staff appropriately.   A: Introduced self to pt.  Met with pt and provided support and encouragement.  Actively listened to pt.  Medication administered per order.  PRN medication administered for vertigo per pt request. R: Pt is compliant with medications.  He attended evening group.  Pt verbally contracts for safety.  Will continue to monitor and assess.

## 2016-04-27 NOTE — BHH Suicide Risk Assessment (Signed)
[facility] Discharge Suicide Risk Assessment   Principal Problem: Alcohol use disorder, severe, dependence ([place]) Discharge Diagnoses:  Patient Active Problem List   Diagnosis Date Noted  . Insomnia [G47.00]   . HTN (hypertension), benign [I10]   . Severe episode of recurrent major depressive disorder, without psychotic features ([place]) [F33.2]   . Major depressive disorder, recurrent episode, severe, with psychotic behavior ([place]) [F33.3] 12/30/2015  . Depression, major, severe recurrence ([place]) [F33.2] 12/30/2015  . Substance induced mood disorder ([place]) [F19.94] 12/02/2015  . Mood disorder in conditions classified elsewhere [F06.30]   . MDD (major depressive disorder), recurrent severe, without psychosis ([place]) [F33.2] 10/08/2015  . Malnutrition of moderate degree [E44.0] 09/24/2015  . Alcohol withdrawal ([place]) [F10.239] 09/23/2015  . Tobacco use disorder [F17.200] 07/16/2015  . Elevated transaminase level [R74.0]   . Drug overdose, intentional ([place]) [T50.902A] 07/12/2015  . Alcohol use disorder, severe, dependence ([place]) [F10.20] 04/15/2015  . Cocaine abuse with cocaine-induced mood disorder ([place]) [F14.14] 04/11/2015  . Overdose [T50.901A] 04/10/2015  . Severe recurrent major depressive disorder with psychotic features ([place]) [F33.3]   . Severe major depression with psychotic features ([place]) [F32.3] 09/11/2014  . Alcohol-induced mood disorder ([place]) [F10.94] 09/10/2014  . Suicidal ideation [R45.851]   . Tylenol overdose [T39.1X1A]   . Polysubstance abuse [F19.10]   . Overdose of acetaminophen [T39.1X1A] 08/03/2014  . Overdose by acetaminophen [T39.1X1A] 08/03/2014  . Cocaine abuse [F14.10] 04/16/2014  . Thrombocytopenia ([place]) [D69.6] 04/15/2014  . Urinary tract infection, site not specified [N39.0] 04/15/2014  . Chest pain, unspecified [R07.9] 04/15/2014  . D-dimer, elevated [R79.89] 04/15/2014  . Transaminitis [R74.0] 09/24/2013  . Cocaine dependence ([place]) [F14.20] 09/20/2013  . S/p  nephrectomy [Z90.5] 04/28/2013  . Seizure ([place]) [R56.9] 03/15/2013  . Syncope [R55] 01/02/2013  . Leukocytopenia, unspecified [D72.819] 01/02/2013  . Left kidney mass [N28.89] 12/24/2012  . PTSD (post-traumatic stress disorder) [F43.10] 07/06/2012  . Peripheral vascular disease ([place]) [I73.9] 01/14/2012  . SEIZURE DISORDER [R56.9] 10/03/2008  . LIVER FUNCTION TESTS, ABNORMAL [R94.5] 12/29/2007  . HYPERCHOLESTEROLEMIA [E78.00] 03/21/2007  . Essential hypertension [I10] 03/21/2007    Total Time spent with patient: 15 minutes  Musculoskeletal: Strength & Muscle Tone: within normal limits Gait & Station: normal Patient leans: N/A  Psychiatric Specialty Exam: Review of Systems  All other systems reviewed and are negative.   Blood pressure (!) 114/93, pulse 85, temperature 97.7 F (36.5 C), temperature source Oral, resp. rate 16, height 5\' 6"  (1.676 m), weight 71.2 kg (157 lb).Body mass index is 25.34 kg/m.  General Appearance: Casual  Eye Contact::  Good  Speech:  [phone]  Volume:  Normal  Mood:  Euthymic  Affect:  Appropriate and Congruent  Thought Process:  Coherent  Orientation:  Full (Time, Place, and Person)  Thought Content:  Negative  Suicidal Thoughts:  No  Homicidal Thoughts:  No  Memory:  Negative  Judgement:  Fair  Insight:  Fair  Psychomotor Activity:  Normal  Concentration:  Good  Recall:  Good  Fund of Knowledge:Good  Language: Good  Akathisia:  No  Handed:  Right  AIMS (if indicated):   0  Assets:  Resilience  Sleep:  Number of Hours: 6  Cognition: WNL  ADL's:  Intact   Mental Status Per Nursing Assessment::   On Admission:  Suicidal ideation indicated by patient, Suicide plan, Plan includes specific time, place, or method, Self-harm thoughts, Self-harm behaviors, Intention to act on suicide plan, Belief that plan would result in death  Demographic Factors:  Male, Abner Greenspan, lesbian,  or bisexual orientation, Low socioeconomic status and  Unemployed  Loss Factors: Decline in physical health  Historical Factors: Prior suicide attempts  Risk Reduction Factors:   Living with another person, especially a relative  Continued Clinical Symptoms:  Alcohol/Substance Abuse/Dependencies  Cognitive Features That Contribute To Risk:  None    Suicide Risk:  Mild:  Suicidal ideation of limited frequency, intensity, duration, and specificity.  There are no identifiable plans, no associated intent, mild dysphoria and related symptoms, good self-control (both objective and subjective assessment), few other risk factors, and identifiable protective factors, including available and accessible social support.  Follow-up Information    [facility] .   Specialty:  [facility] information: [place] [id number] [phone]           Plan Of Care/Follow-up recommendations:  Other:  Patient reports he no longer has any suicidal ideation, plan or intent. He states that he has completed his detox, has a place to live and has gotten applications for long-term programs and feels ready to be discharged. His recommended that he remain in follow-up with his medical and psychiatric providers and that he pursue residential treatment as planned for his substance use issues.  Linard Millers, MD 04/27/2016, 9:15 AM

## 2016-04-28 NOTE — Clinical Social Work Note (Signed)
Call from patient, states [facility] (fax:  [phone] - attn Karren Cobble) wants referral resent.  Can call him at [phone].  CSW Coble informed.    Edwyna Shell, LCSW Lead Clinical Social Worker Phone:  [phone]

## 2016-05-04 ENCOUNTER — Encounter (HOSPITAL_COMMUNITY): Payer: Self-pay | Admitting: Emergency Medicine

## 2016-05-04 DIAGNOSIS — Z853 Personal history of malignant neoplasm of breast: Secondary | ICD-10-CM | POA: Insufficient documentation

## 2016-05-04 DIAGNOSIS — I2510 Atherosclerotic heart disease of native coronary artery without angina pectoris: Secondary | ICD-10-CM | POA: Insufficient documentation

## 2016-05-04 DIAGNOSIS — F10288 Alcohol dependence with other alcohol-induced disorder: Secondary | ICD-10-CM | POA: Insufficient documentation

## 2016-05-04 DIAGNOSIS — Z79899 Other long term (current) drug therapy: Secondary | ICD-10-CM | POA: Insufficient documentation

## 2016-05-04 DIAGNOSIS — Z8553 Personal history of malignant neoplasm of renal pelvis: Secondary | ICD-10-CM | POA: Insufficient documentation

## 2016-05-04 DIAGNOSIS — I10 Essential (primary) hypertension: Secondary | ICD-10-CM | POA: Insufficient documentation

## 2016-05-04 DIAGNOSIS — F17200 Nicotine dependence, unspecified, uncomplicated: Secondary | ICD-10-CM | POA: Insufficient documentation

## 2016-05-04 DIAGNOSIS — R45851 Suicidal ideations: Secondary | ICD-10-CM | POA: Insufficient documentation

## 2016-05-04 LAB — RAPID URINE DRUG SCREEN, HOSP PERFORMED
AMPHETAMINES: NOT DETECTED
BARBITURATES: NOT DETECTED
BENZODIAZEPINES: NOT DETECTED
Cocaine: POSITIVE — AB
Opiates: NOT DETECTED
Tetrahydrocannabinol: NOT DETECTED

## 2016-05-04 LAB — COMPREHENSIVE METABOLIC PANEL
ALBUMIN: 4.4 g/dL (ref 3.5–5.0)
ALK PHOS: 58 U/L (ref 38–126)
ALT: 27 U/L (ref 17–63)
AST: 44 U/L — AB (ref 15–41)
Anion gap: 12 (ref 5–15)
BILIRUBIN TOTAL: 0.4 mg/dL (ref 0.3–1.2)
BUN: 12 mg/dL (ref 6–20)
CO2: 26 mmol/L (ref 22–32)
CREATININE: 0.97 mg/dL (ref 0.61–1.24)
Calcium: 9.5 mg/dL (ref 8.9–10.3)
Chloride: 101 mmol/L (ref 101–111)
GFR calc Af Amer: >60 mL/min (ref >60)
GLUCOSE: 86 mg/dL (ref 65–99)
POTASSIUM: 3.8 mmol/L (ref 3.5–5.1)
Sodium: 139 mmol/L (ref 135–145)
TOTAL PROTEIN: 8 g/dL (ref 6.5–8.1)

## 2016-05-04 LAB — CBC
HEMATOCRIT: 43.1 % (ref 39.0–52.0)
Hemoglobin: 15.3 g/dL (ref 13.0–17.0)
MCH: 29.1 pg (ref 26.0–34.0)
MCHC: 35.5 g/dL (ref 30.0–36.0)
MCV: 82.1 fL (ref 78.0–100.0)
Platelets: 217 10*3/uL (ref 150–400)
RBC: 5.25 MIL/uL (ref 4.22–5.81)
RDW: 14 % (ref 11.5–15.5)
WBC: 7.1 10*3/uL (ref 4.0–10.5)

## 2016-05-04 LAB — ACETAMINOPHEN LEVEL: Acetaminophen (Tylenol), Serum: <10 ug/mL — ABNORMAL LOW (ref 10–30)

## 2016-05-04 LAB — SALICYLATE LEVEL: Salicylate Lvl: <7 mg/dL (ref 2.8–30.0)

## 2016-05-04 LAB — ETHANOL: Alcohol, Ethyl (B): 389 mg/dL (ref <5)

## 2016-05-04 NOTE — ED Notes (Signed)
Security wanded pt. at triage .

## 2016-05-04 NOTE — ED Notes (Signed)
Dr. Hillard Danker notified on pt.'s elevated ETOH level.

## 2016-05-04 NOTE — ED Triage Notes (Signed)
Pt. reports suicidal ideation plans to stab himself , also requesting detox for his alcoholism , last drink today , denies hallucinations.

## 2016-05-04 NOTE — ED Notes (Signed)
Staffing office and charge nurse notified for pt.'s sitter , security notified to wand pt., purple scrubs given to pt.

## 2016-05-05 ENCOUNTER — Emergency Department (HOSPITAL_COMMUNITY)
Admission: EM | Admit: 2016-05-05 | Discharge: 2016-05-09 | Disposition: A | Discharge status: Home / Self Care | Payer: No Typology Code available for payment source | Attending: Emergency Medicine | Admitting: Emergency Medicine

## 2016-05-05 DIAGNOSIS — F10288 Alcohol dependence with other alcohol-induced disorder: Secondary | ICD-10-CM

## 2016-05-05 DIAGNOSIS — R45851 Suicidal ideations: Secondary | ICD-10-CM

## 2016-05-05 DIAGNOSIS — F10920 Alcohol use, unspecified with intoxication, uncomplicated: Secondary | ICD-10-CM

## 2016-05-05 DIAGNOSIS — F1094 Alcohol use, unspecified with alcohol-induced mood disorder: Secondary | ICD-10-CM | POA: Diagnosis present

## 2016-05-05 LAB — ETHANOL: Alcohol, Ethyl (B): 252 mg/dL — ABNORMAL HIGH (ref <5)

## 2016-05-05 MED ORDER — AMLODIPINE BESYLATE 5 MG PO TABS: 5.0000 mg | ORAL_TABLET | Freq: Every day | ORAL | Status: DC

## 2016-05-05 MED ORDER — LORAZEPAM 1 MG PO TABS
0.0000 mg | ORAL_TABLET | Freq: Four times a day (QID) | ORAL | Status: AC
  Administered 2016-05-05 (×2): 1 mg via ORAL
  Filled 2016-05-05 (×2): qty 1

## 2016-05-05 MED ORDER — THIAMINE HCL 100 MG/ML IJ SOLN: 100.0000 mg | Freq: Every day | INTRAMUSCULAR | Status: DC

## 2016-05-05 MED ORDER — AMLODIPINE BESYLATE 5 MG PO TABS
5.0000 mg | ORAL_TABLET | Freq: Every day | ORAL | Status: DC
  Administered 2016-05-05 – 2016-05-08 (×4): 5 mg via ORAL
  Filled 2016-05-05 (×4): qty 1

## 2016-05-05 MED ORDER — NICOTINE 21 MG/24HR TD PT24
21.0000 mg | MEDICATED_PATCH | Freq: Every day | TRANSDERMAL | Status: DC
  Filled 2016-05-05 (×2): qty 1

## 2016-05-05 MED ORDER — IBUPROFEN 200 MG PO TABS
600.0000 mg | ORAL_TABLET | Freq: Three times a day (TID) | ORAL | Status: DC | PRN
  Administered 2016-05-06: 600 mg via ORAL
  Filled 2016-05-05: qty 1

## 2016-05-05 MED ORDER — ATORVASTATIN CALCIUM 10 MG PO TABS
20.0000 mg | ORAL_TABLET | Freq: Every day | ORAL | Status: DC
  Administered 2016-05-06 – 2016-05-08 (×3): 20 mg via ORAL
  Filled 2016-05-05 (×3): qty 2

## 2016-05-05 MED ORDER — QUETIAPINE FUMARATE 200 MG PO TABS
200.0000 mg | ORAL_TABLET | Freq: Every day | ORAL | Status: DC
  Administered 2016-05-05 – 2016-05-08 (×4): 200 mg via ORAL
  Filled 2016-05-05 (×4): qty 1

## 2016-05-05 MED ORDER — VITAMIN B-1 100 MG PO TABS
100.0000 mg | ORAL_TABLET | Freq: Every day | ORAL | Status: DC
  Administered 2016-05-06 – 2016-05-09 (×4): 100 mg via ORAL
  Filled 2016-05-05 (×4): qty 1

## 2016-05-05 MED ORDER — DIPHENHYDRAMINE HCL 25 MG PO CAPS
50.0000 mg | ORAL_CAPSULE | Freq: Once | ORAL | Status: AC
  Administered 2016-05-05: 50 mg via ORAL
  Filled 2016-05-05: qty 2

## 2016-05-05 MED ORDER — ALUM & MAG HYDROXIDE-SIMETH 200-200-20 MG/5ML PO SUSP: 30.0000 mL | ORAL | Status: DC | PRN

## 2016-05-05 MED ORDER — ONDANSETRON HCL 4 MG PO TABS
4.0000 mg | ORAL_TABLET | Freq: Three times a day (TID) | ORAL | Status: DC | PRN
  Administered 2016-05-05 (×2): 4 mg via ORAL
  Filled 2016-05-05 (×2): qty 1

## 2016-05-05 NOTE — ED Notes (Signed)
Notified by TTS that they will not perform an evaluation until pt's BAC is <250.  Provider notified of need to perform a repeat ethanol level.  New orders received.

## 2016-05-05 NOTE — Progress Notes (Signed)
Referred pt to the following facilities for inpatient treatment: [facility]- per [place] [facility] [facility]- per [facility]- per [place]  Left voicemails with [facility] and [facility] and will refer if there is bed availability, other facilities contacted are at capacity.  Sharren Bridge, MSW, LCSW Clinical Social Work, Disposition  05/05/2016 [phone]

## 2016-05-05 NOTE — ED Notes (Signed)
Pt given 9 PM snack and beverage.

## 2016-05-05 NOTE — ED Provider Notes (Signed)
[facility] DEPT Provider Note   CSN: [id number] Arrival date & time: 05/04/16  2152  History   Chief Complaint Chief Complaint  Patient presents with  . Suicidal  . Alcohol Problem    HPI [name] is a 35 y.o. male.  HPI  Patient with a PMH of angina, bipolar 1 disord,er breast CA, CAD, depression, suicide attempt, hedaches, hypertension, pancreatitis, schizophrenia, seizures, SOB comes to the ER requesting detox from alcohol. Says he drinks half a gallon of whatever he can get his hands on daily. Has hallucinations and seizures when ETOH is not in his system. He also complains of being SI, says he will stab himself or jump in front of a car. No HI, admits to crack cocaine usage. Currently patient is intoxicated but not showing any signs of withdrawal as his vital signs are stable.  Past Medical History:  Diagnosis Date  . Angina   . Bipolar 1 disorder ([place])   . Breast CA ([place]) dx'd 2009   bil w/ bil masectomy and oral meds  . Cancer of kidney ([place]) dx'd 04/2013   lt nephrectomy  . Coronary artery disease   . Depression   . H/O suicide attempt 2015   overdose  . Headache(784.0)   . Hypercholesteremia   . Hypertension   . Liver cirrhosis ([place])   . Pancreatitis   . Peripheral vascular disease [facility]) April 2011   Left Pop  . Schizophrenia ([place])   . Seizures ([place])   . Shortness of breath     Patient Active Problem List   Diagnosis Date Noted  . Insomnia   . HTN (hypertension), benign   . Severe episode of recurrent major depressive disorder, without psychotic features ([place])   . Major depressive disorder, recurrent episode, severe, with psychotic behavior ([place]) 12/30/2015  . Depression, major, severe recurrence ([place]) 12/30/2015  . Substance induced mood disorder ([place]) 12/02/2015  . Mood disorder in conditions classified elsewhere   . MDD (major depressive disorder), recurrent severe, without psychosis ([place]) 10/08/2015  . Malnutrition of moderate degree  09/24/2015  . Alcohol withdrawal ([place]) 09/23/2015  . Tobacco use disorder 07/16/2015  . Elevated transaminase level   . Drug overdose, intentional ([place]) 07/12/2015  . Alcohol use disorder, severe, dependence ([place]) 04/15/2015  . Cocaine abuse with cocaine-induced mood disorder ([place]) 04/11/2015  . Overdose 04/10/2015  . Severe recurrent major depressive disorder with psychotic features ([place])   . Severe major depression with psychotic features ([place]) 09/11/2014  . Alcohol-induced mood disorder ([place]) 09/10/2014  . Suicidal ideation   . Tylenol overdose   . Polysubstance abuse   . Overdose of acetaminophen 08/03/2014  . Overdose by acetaminophen 08/03/2014  . Cocaine abuse 04/16/2014  . Thrombocytopenia ([place]) 04/15/2014  . Urinary tract infection, site not specified 04/15/2014  . Chest pain, unspecified 04/15/2014  . D-dimer, elevated 04/15/2014  . Transaminitis 09/24/2013  . Cocaine dependence ([place]) 09/20/2013  . S/p nephrectomy 04/28/2013  . Seizure ([place]) 03/15/2013  . Syncope 01/02/2013  . Leukocytopenia, unspecified 01/02/2013  . Left kidney mass 12/24/2012  . PTSD (post-traumatic stress disorder) 07/06/2012  . Peripheral vascular disease ([place]) 01/14/2012  . SEIZURE DISORDER 10/03/2008  . LIVER FUNCTION TESTS, ABNORMAL 12/29/2007  . HYPERCHOLESTEROLEMIA 03/21/2007  . Essential hypertension 03/21/2007    Past Surgical History:  Procedure Laterality Date  . BREAST SURGERY    . CHEST SURGERY    . left kidney removal    . left leg surgery    . MASTECTOMY  Home Medications    Prior to Admission medications   Medication Sig Start Date End Date Taking? Authorizing Provider  amLODipine (NORVASC) 5 MG tablet Take 1 tablet (5 mg total) by mouth daily. 04/28/16  Yes Kerrie [name], NP  atorvastatin (LIPITOR) 20 MG tablet Take 1 tablet (20 mg total) by mouth daily at 6 PM. 04/27/16  Yes Kerrie [name], NP  hydrOXYzine (ATARAX/VISTARIL) 25 MG tablet Take 1 tablet (25 mg  total) by mouth every 6 (six) hours as needed for anxiety. 04/27/16  Yes Kerrie [name], NP  QUEtiapine (SEROQUEL) 100 MG tablet Take 1 tablet (100 mg total) by mouth at bedtime. 04/27/16  Yes Kerrie [name], NP  QUEtiapine (SEROQUEL) 50 MG tablet Take 1 tablet (50 mg total) by mouth 2 (two) times daily. 04/27/16  Yes Kerrie [name], NP    Family History Family History  Problem Relation Age of Onset  . Stroke Other   . Cancer Other   . Hyperlipidemia Mother   . Hypertension Mother     Social History Social History  Substance Use Topics  . Smoking status: Current Every Day Smoker  . Smokeless tobacco: Never Used  . Alcohol use 0.0 oz/week     Allergies   Codeine; Penicillins; Depakote [divalproex sodium]; Morphine; Coconut flavor; Depakote er [divalproex sodium er]; Oxycodone; and Hydrocodone-acetaminophen   Review of Systems Review of Systems Review of Systems All other systems negative except as documented in the HPI. All pertinent positives and negatives as reviewed in the HPI.   Physical Exam Updated Vital Signs There were no vitals taken for this visit.  Physical Exam  Constitutional: He appears well-developed and well-nourished. No distress.  HENT:  Head: Normocephalic and atraumatic.  Poor dentition.  Eyes: Pupils are equal, round, and reactive to light. Right conjunctiva is injected. Left conjunctiva is injected.  Neck: Normal range of motion. Neck supple.  Cardiovascular: Normal rate and regular rhythm.   Pulmonary/Chest: Effort normal.  Abdominal: Soft.  Neurological: He is alert.  Pt intoxicated  Skin: Skin is warm and dry.  Psychiatric: His speech is normal. He is slowed. Thought content is not delusional. He exhibits a depressed mood. He expresses suicidal ideation. He expresses no homicidal ideation. He expresses suicidal plans. He expresses no homicidal plans.  Nursing note and vitals reviewed.    ED Treatments / Results  Labs (all labs ordered  are listed, but only abnormal results are displayed) Labs Reviewed  COMPREHENSIVE METABOLIC PANEL - Abnormal; Notable for the following:       Result Value   AST 44 (*)    All other components within normal limits  ETHANOL - Abnormal; Notable for the following:    Alcohol, Ethyl (B) 389 (*)    All other components within normal limits  ACETAMINOPHEN LEVEL - Abnormal; Notable for the following:    Acetaminophen (Tylenol), Serum <10 (*)    All other components within normal limits  RAPID URINE DRUG SCREEN, HOSP PERFORMED - Abnormal; Notable for the following:    Cocaine POSITIVE (*)    All other components within normal limits  SALICYLATE LEVEL  CBC    EKG  EKG Interpretation  Date/Time:  Tuesday May 04 2016 22:03:13 EDT Ventricular Rate:  104 PR Interval:  130 QRS Duration: 84 QT Interval:  356 QTC Calculation: 468 R Axis:   68 Text Interpretation:  Sinus tachycardia Otherwise normal ECG Confirmed by Christy Gentles  MD, DONALD ([id number]) on 05/05/2016 1:58:58 AM       Radiology No results  found.  Procedures Procedures (including critical care time)  Medications Ordered in ED Medications  LORazepam (ATIVAN) tablet 0-4 mg (not administered)  thiamine (VITAMIN B-1) tablet 100 mg (not administered)    Or  thiamine (B-1) injection 100 mg (not administered)  nicotine (NICODERM CQ - dosed in mg/24 hours) patch 21 mg (not administered)  ondansetron (ZOFRAN) tablet 4 mg (not administered)  alum & mag hydroxide-simeth (MAALOX/MYLANTA) 200-200-20 MG/5ML suspension 30 mL (not administered)  ibuprofen (ADVIL,MOTRIN) tablet 600 mg (not administered)     Initial Impression / Assessment and Plan / ED Course  I have reviewed the triage vital signs and the nursing notes.  Pertinent labs & imaging results that were available during my care of the patient were reviewed by me and considered in my medical decision making (see chart for details).  Clinical Course    Psych holding  orders placed Home meds reviewed and ordered as appropriate TTS consult ordered Considered CIWA protocol and ordered Labs pending have resulted pt intoxicated but medically cleared.  There were no vitals filed for this visit. Final Clinical Impressions(s) / ED Diagnoses   Final diagnoses:  Alcoholic intoxication without complication ([place])  Suicidal ideations  Alcohol dependence with other alcohol-induced disorder [facility])    New Prescriptions New Prescriptions   No medications on file     Delos Haring, PA-C 05/05/16 0249    Ripley Fraise, MD 05/05/16 2326

## 2016-05-05 NOTE — ED Provider Notes (Signed)
Patient alert Glasgow Coma Score 15. Appears comfortable. Complains of "feeling shaky." No visible tremors. Results for orders placed or performed during the hospital encounter of 05/05/16  Comprehensive metabolic panel  Result Value Ref Range   Sodium 139 135 - 145 mmol/L   Potassium 3.8 3.5 - 5.1 mmol/L   Chloride 101 101 - 111 mmol/L   CO2 26 22 - 32 mmol/L   Glucose, Bld 86 65 - 99 mg/dL   BUN 12 6 - 20 mg/dL   Creatinine, Ser 0.97 0.61 - 1.24 mg/dL   Calcium 9.5 8.9 - 10.3 mg/dL   Total Protein 8.0 6.5 - 8.1 g/dL   Albumin 4.4 3.5 - 5.0 g/dL   AST 44 (H) 15 - 41 U/L   ALT 27 17 - 63 U/L   Alkaline Phosphatase 58 38 - 126 U/L   Total Bilirubin 0.4 0.3 - 1.2 mg/dL   GFR calc non Af Amer >60 >60 mL/min   GFR calc Af Amer >60 >60 mL/min   Anion gap 12 5 - 15  Ethanol  Result Value Ref Range   Alcohol, Ethyl (B) 389 (HH) <5 mg/dL  Salicylate level  Result Value Ref Range   Salicylate Lvl [id number] 2.8 - 30.0 mg/dL  Acetaminophen level  Result Value Ref Range   Acetaminophen (Tylenol), Serum <10 (L) 10 - 30 ug/mL  cbc  Result Value Ref Range   WBC 7.1 4.0 - 10.5 K/uL   RBC 5.25 4.22 - 5.81 MIL/uL   Hemoglobin 15.3 13.0 - 17.0 g/dL   HCT 43.1 39.0 - 52.0 %   MCV 82.1 78.0 - 100.0 fL   MCH 29.1 26.0 - 34.0 pg   MCHC 35.5 30.0 - 36.0 g/dL   RDW 14.0 11.5 - 15.5 %   Platelets 217 150 - 400 K/uL  Rapid urine drug screen (hospital performed)  Result Value Ref Range   Opiates NONE DETECTED NONE DETECTED   Cocaine POSITIVE (A) NONE DETECTED   Benzodiazepines NONE DETECTED NONE DETECTED   Amphetamines NONE DETECTED NONE DETECTED   Tetrahydrocannabinol NONE DETECTED NONE DETECTED   Barbiturates NONE DETECTED NONE DETECTED  Ethanol  Result Value Ref Range   Alcohol, Ethyl (B) 252 (H) <5 mg/dL   No results found.   Orlie Dakin, MD 05/05/16 [phone]

## 2016-05-05 NOTE — ED Notes (Signed)
Pt requesting Pm home medications, Seroquel. MD made aware and approved.

## 2016-05-05 NOTE — ED Notes (Signed)
Breakfast tray ordered 

## 2016-05-05 NOTE — ED Notes (Signed)
Telepsych to bedside. 

## 2016-05-05 NOTE — ED Notes (Signed)
Pt dinner ordered placed at 1530.

## 2016-05-05 NOTE — ED Notes (Signed)
Pt refused 3 PM snack.

## 2016-05-05 NOTE — BH Assessment (Signed)
Tele Assessment Note   [name] is an 35 y.o. male.   Patient with requesting detox from alcohol, drinking 8 to 9 bottles of wine a day. Denies any sobriety since his last admission here at [facility] discharged 04/27/16. Also, using $20 worth of crack cocaine daily. Patient had intoxicated when he arrived at the hospital stating he wanted to "stab himself or jump in front of a car."  After patient alcohol level was reduced patient still expressed SI to this clinician with plan to OD. No HI or A/V. Patient expressed shaking of hands and nausea. ER physician to begin CIWA protocol. Patient had at least x4 admission to [facility] this year and follows up at [facility] in [place]. Patient would confirm if he followed up at [facility] since his last admission. Patient states he is taking his medication as prescribed.   Patient was discharged on 04/27/16 with plan to contact [facility] treatment center until they had a bed. Patient states he has not been able to remain sober and follow through with discharge plan to treatment facility. Patient expressed hopelessness, fair sleep, decreased appetite, poor impulse control and judgement. Patient also expressed frustration about his financial issues. Patient was agitated, dismissive with this clinician, gave poor eye contact, was alert and oriented.    Jinny Blossom, NP  recommends inpatient unit.       Diagnosis: Alcohol Use disorder, severe dependence; Major depressive disorder , recurrent severe, without psychosis  Past Medical History:  Past Medical History:  Diagnosis Date  . Angina   . Bipolar 1 disorder ([place])   . Breast CA ([place]) dx'd 2009   bil w/ bil masectomy and oral meds  . Cancer of kidney ([place]) dx'd 04/2013   lt nephrectomy  . Coronary artery disease   . Depression   . H/O suicide attempt 2015   overdose  . Headache(784.0)   . Hypercholesteremia   . Hypertension   . Liver cirrhosis ([place])   . Pancreatitis   . Peripheral vascular  disease [facility]) April 2011   Left Pop  . Schizophrenia ([place])   . Seizures ([place])   . Shortness of breath     Past Surgical History:  Procedure Laterality Date  . BREAST SURGERY    . CHEST SURGERY    . left kidney removal    . left leg surgery    . MASTECTOMY      Family History:  Family History  Problem Relation Age of Onset  . Stroke Other   . Cancer Other   . Hyperlipidemia Mother   . Hypertension Mother     Social History:  reports that he has been smoking.  He has never used smokeless tobacco. He reports that he drinks alcohol. He reports that he does not use drugs.  Additional Social History:  Alcohol / Drug Use Pain Medications: see MAR Prescriptions: see MAR Over the Counter: see MAR History of alcohol / drug use?: Yes Substance #1 Name of Substance 1: alcohol 1 - Amount (size/oz): 8 or 9 bottles of wine 1 - Frequency: daily  1 - Duration: last few weeks since d/c from [facility] 1 - Last Use / Amount: yesterday Substance #2 Name of Substance 2: Cocaine/crack 2 - Amount (size/oz): $20 a day 2 - Frequency: daily 2 - Duration: last few weeks  2 - Last Use / Amount: yesterday  CIWA: CIWA-Ar BP: 120/82 Pulse Rate: 92 Nausea and Vomiting: mild nausea with no vomiting Tactile Disturbances: none Tremor: not visible, but  can be felt fingertip to fingertip Auditory Disturbances: not present Paroxysmal Sweats: no sweat visible Visual Disturbances: not present Anxiety: mildly anxious Headache, Fullness in Head: none present Agitation: normal activity Orientation and Clouding of Sensorium: oriented and can do serial additions CIWA-Ar Total: 3 COWS:    PATIENT STRENGTHS: (choose at least two) Average or above average intelligence Supportive family/friends  Allergies:  Allergies  Allergen Reactions  . Codeine Hives, Itching and Swelling  . Penicillins Swelling    Has patient had a PCN reaction causing immediate rash, facial/tongue/throat swelling, SOB or  lightheadedness with hypotension: Yes Has patient had a PCN reaction causing severe rash involving mucus membranes or skin necrosis: Yes Has patient had a PCN reaction that required hospitalization Yes-ed visit Has patient had a PCN reaction occurring within the last 10 years: Yes If all of the above answers are "NO", then may proceed with Cephalosporin use.   . Depakote [Divalproex Sodium] Hives and Other (See Comments)    "Bug out and hallucinate"  . Morphine Itching  . Coconut Flavor Itching and Swelling  . Depakote Er [Divalproex Sodium Er] Hives  . Oxycodone Itching and Swelling  . Hydrocodone-Acetaminophen Itching and Rash    Home Medications:  (Not in a hospital admission)  OB/GYN Status:  No LMP for male patient.  General Assessment Data Location of Assessment: [facility] ED TTS Assessment: In system Is this a Tele or Face-to-Face Assessment?: Tele Assessment Is this an Initial Assessment or a Re-assessment for this encounter?: Initial Assessment Marital status: Single Is patient pregnant?: No Pregnancy Status: No Living Arrangements: Parent Can pt return to current living arrangement?: Yes Admission Status: Voluntary Is patient capable of signing voluntary admission?: Yes Referral Source: Self/Family/Friend Insurance type: self pay     Crisis Care Plan Living Arrangements: Parent Name of Psychiatrist: [facility] Sessions Name of Therapist: [facility]  Education Status Is patient currently in school?: No Highest grade of school patient has completed: GED  Risk to self with the past 6 months Suicidal Ideation: Yes-Currently Present Has patient been a risk to self within the past 6 months prior to admission? : Yes Suicidal Intent: Yes-Currently Present Has patient had any suicidal intent within the past 6 months prior to admission? : Yes Is patient at risk for suicide?: Yes Suicidal Plan?: Yes-Currently Present Has patient had any suicidal plan within the past 6 months prior to  admission? : Yes Specify Current Suicidal Plan: Overdose Access to Means: Yes Specify Access to Suicidal Means: has medications What has been your use of drugs/alcohol within the last 12 months?: alcohol and cocaine Previous Attempts/Gestures: Yes Other Self Harm Risks: 45 Triggers for Past Attempts: Unpredictable Intentional Self Injurious Behavior: Cutting Family Suicide History: No Recent stressful life event(s): Other (Comment), Financial Problems Persecutory voices/beliefs?: No Depression: Yes Depression Symptoms: Feeling worthless/self pity, Feeling angry/irritable Substance abuse history and/or treatment for substance abuse?: Yes Suicide prevention information given to non-admitted patients: Not applicable  Risk to Others within the past 6 months Homicidal Ideation: No Does patient have any lifetime risk of violence toward others beyond the six months prior to admission? : No Thoughts of Harm to Others: No Current Homicidal Intent: No Current Homicidal Plan: No Access to Homicidal Means: No Identified Victim: none History of harm to others?: No Assessment of Violence: None Noted Violent Behavior Description: pt denies Does patient have access to weapons?: No Criminal Charges Pending?: No Does patient have a court date: No Is patient on probation?: Yes  Psychosis Hallucinations: None noted Delusions:  None noted  Mental Status Report Appearance/Hygiene: Disheveled Eye Contact: Poor Motor Activity: Restlessness Speech: Argumentative Level of Consciousness: Alert Mood: Irritable Affect: Blunted Anxiety Level: None Judgement: Impaired Orientation: Person, Place, Time, Situation Obsessive Compulsive Thoughts/Behaviors: None  Cognitive Functioning Concentration: Poor Memory: Recent Intact, Remote Intact IQ: Average Insight: Fair Impulse Control: Poor Appetite: Poor Sleep: No Change Vegetative Symptoms: None  ADLScreening [facility] Assessment Services) [name]  cognitive ability adequate to safely complete daily activities?: Yes Patient able to express need for assistance with ADLs?: No Independently performs ADLs?: Yes (appropriate for developmental age)  Prior Inpatient Therapy Prior Inpatient Therapy: Yes Prior Therapy Dates: 10/17, 06/17. 05/17, 03/17 Prior Therapy Facilty/Provider(s): [facility] Reason for Treatment: SI, SA  Prior Outpatient Therapy Prior Outpatient Therapy: Yes Prior Therapy Dates: Past 15 years Prior Therapy Facilty/Provider(s): [facility], [facility] Reason for Treatment: med management Does patient have an ACCT team?: No Does patient have Intensive In-House Services?  : No Does patient have [facility] services? : No Does patient have P4CC services?: No  ADL Screening (condition at time of admission) [name] cognitive ability adequate to safely complete daily activities?: Yes Is the patient deaf or have difficulty hearing?: No Does the patient have difficulty seeing, even when wearing glasses/contacts?: No Does the patient have difficulty concentrating, remembering, or making decisions?: Yes Patient able to express need for assistance with ADLs?: No Does the patient have difficulty dressing or bathing?: No Independently performs ADLs?: Yes (appropriate for developmental age)       Abuse/Neglect Assessment (Assessment to be complete while patient is alone) Physical Abuse: Denies Verbal Abuse: Denies Sexual Abuse: Denies     Advance Directives (For Healthcare) Does patient have an advance directive?: No Would patient like information on creating an advanced directive?: No - patient declined information    Additional Information 1:1 In Past 12 Months?: No CIRT Risk: No Elopement Risk: No Does patient have medical clearance?: Yes  Child/Adolescent Assessment Running Away Risk: Denies Bed-Wetting: Denies Destruction of Property: Denies Cruelty to Animals: Denies Stealing: Denies Rebellious/Defies Authority:  Denies Satanic Involvement: Denies Science writer: Denies Problems at [employer]: Denies Gang Involvement: Denies Gang Involvement as Evidenced By: denies  Disposition:  Disposition Initial Assessment Completed for this Encounter: Yes Disposition of Patient: Inpatient treatment program (per Jinny Blossom, NP patient meets inpatient criteria) Type of inpatient treatment program: Adult  Mollie Germany 05/05/2016 9:36 AM

## 2016-05-05 NOTE — BH Assessment (Signed)
Pt's current BAL is 389 (HH). Spoke with Tim, RN to advise that the pt's BAL must be under 250 in order to complete the assessment. Tim, RN advised he would contact the MD to discuss removing the consult until the pt is able to be assessed.   Lind Covert, MSW, Latanya Presser

## 2016-05-06 MED ORDER — DULOXETINE HCL 20 MG PO CPEP
20.0000 mg | ORAL_CAPSULE | Freq: Every day | ORAL | Status: DC
  Administered 2016-05-06 – 2016-05-09 (×4): 20 mg via ORAL
  Filled 2016-05-06 (×4): qty 1

## 2016-05-06 MED ORDER — QUETIAPINE FUMARATE 25 MG PO TABS
100.0000 mg | ORAL_TABLET | Freq: Two times a day (BID) | ORAL | Status: DC
  Administered 2016-05-06 – 2016-05-09 (×7): 100 mg via ORAL
  Filled 2016-05-06 (×7): qty 4

## 2016-05-06 MED ORDER — HYDROXYZINE HCL 25 MG PO TABS
25.0000 mg | ORAL_TABLET | Freq: Two times a day (BID) | ORAL | Status: DC | PRN
  Administered 2016-05-06 – 2016-05-09 (×4): 25 mg via ORAL
  Filled 2016-05-06 (×4): qty 1

## 2016-05-06 NOTE — ED Provider Notes (Signed)
Resting in bed comfortably.   Orlie Dakin, MD 05/06/16 [phone]

## 2016-05-06 NOTE — ED Notes (Signed)
MD at bedside. 

## 2016-05-06 NOTE — ED Notes (Signed)
Patient was given a snack and drink, and a regular diet was called in for patient.

## 2016-05-06 NOTE — Progress Notes (Signed)
Followed up on inpatient referrals. Pt referred to: [facility]- per Kim Left voicemails with [facility] and [facility] and will refer if there is bed availability. Pt previously referred to [facility] and [facility] however there is no bed availability today or at other facilities contacted.  Sharren Bridge, MSW, LCSW Clinical Social Work, Disposition  05/06/2016 [phone]

## 2016-05-06 NOTE — ED Notes (Signed)
Pt up to bathroom.

## 2016-05-06 NOTE — ED Notes (Signed)
Dinner tray at bedside

## 2016-05-07 DIAGNOSIS — R45851 Suicidal ideations: Secondary | ICD-10-CM

## 2016-05-07 DIAGNOSIS — Z823 Family history of stroke: Secondary | ICD-10-CM

## 2016-05-07 DIAGNOSIS — Z8249 Family history of ischemic heart disease and other diseases of the circulatory system: Secondary | ICD-10-CM

## 2016-05-07 DIAGNOSIS — F1094 Alcohol use, unspecified with alcohol-induced mood disorder: Secondary | ICD-10-CM

## 2016-05-07 DIAGNOSIS — Z888 Allergy status to other drugs, medicaments and biological substances status: Secondary | ICD-10-CM

## 2016-05-07 DIAGNOSIS — Z808 Family history of malignant neoplasm of other organs or systems: Secondary | ICD-10-CM | POA: Diagnosis not present

## 2016-05-07 DIAGNOSIS — F17210 Nicotine dependence, cigarettes, uncomplicated: Secondary | ICD-10-CM

## 2016-05-07 DIAGNOSIS — Z880 Allergy status to penicillin: Secondary | ICD-10-CM

## 2016-05-07 NOTE — ED Provider Notes (Signed)
The patient has been seen and evaluated by the psychiatric services, has been determined that he needs an inpatient psychiatric stay due to his decompensated suicidal thoughts that he may be an imminent danger to himself or others. He is cleared medically to go to a psychiatric facility   [name], MD 05/07/16 1102

## 2016-05-07 NOTE — Consult Note (Signed)
[facility] Tele-Psychiatry Consult   Reason for Consult:  Alcohol abuse, Suicidal ideation Referring Physician:  EDP [name] MRN:  [id number] Principal Diagnosis: Alcohol-induced mood disorder ([place]) Diagnosis:   Patient Active Problem List   Diagnosis Date Noted  . Insomnia [G47.00]   . HTN (hypertension), benign [I10]   . Severe episode of recurrent major depressive disorder, without psychotic features ([place]) [F33.2]   . Major depressive disorder, recurrent episode, severe, with psychotic behavior ([place]) [F33.3] 12/30/2015  . Depression, major, severe recurrence ([place]) [F33.2] 12/30/2015  . Substance induced mood disorder ([place]) [F19.94] 12/02/2015  . Mood disorder in conditions classified elsewhere [F06.30]   . MDD (major depressive disorder), recurrent severe, without psychosis ([place]) [F33.2] 10/08/2015  . Malnutrition of moderate degree [E44.0] 09/24/2015  . Alcohol withdrawal ([place]) [F10.239] 09/23/2015  . Tobacco use disorder [F17.200] 07/16/2015  . Elevated transaminase level [R74.0]   . Drug overdose, intentional ([place]) [T50.902A] 07/12/2015  . Alcohol use disorder, severe, dependence ([place]) [F10.20] 04/15/2015  . Cocaine abuse with cocaine-induced mood disorder ([place]) [F14.14] 04/11/2015  . Overdose [T50.901A] 04/10/2015  . Severe recurrent major depressive disorder with psychotic features ([place]) [F33.3]   . Severe major depression with psychotic features ([place]) [F32.3] 09/11/2014  . Alcohol-induced mood disorder ([place]) [F10.94] 09/10/2014  . Suicidal ideation [R45.851]   . Tylenol overdose [T39.1X1A]   . Polysubstance abuse [F19.10]   . Overdose of acetaminophen [T39.1X1A] 08/03/2014  . Overdose by acetaminophen [T39.1X1A] 08/03/2014  . Cocaine abuse [F14.10] 04/16/2014  . Thrombocytopenia ([place]) [D69.6] 04/15/2014  . Urinary tract infection, site not specified [N39.0] 04/15/2014  . Chest pain, unspecified [R07.9] 04/15/2014  . D-dimer, elevated [R79.89]  04/15/2014  . Transaminitis [R74.0] 09/24/2013  . Cocaine dependence ([place]) [F14.20] 09/20/2013  . 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 Seizure ([place]) [R56.9] 03/15/2013  . Syncope [R55] 01/02/2013  . Leukocytopenia, unspecified [D72.819] 01/02/2013  . Left kidney mass [N28.89] 12/24/2012  . PTSD (post-traumatic stress disorder) [F43.10] 07/06/2012  . Peripheral vascular disease ([place]) [I73.9] 01/14/2012  . SEIZURE DISORDER [R56.9] 10/03/2008  . LIVER FUNCTION TESTS, ABNORMAL [R94.5] 12/29/2007  . HYPERCHOLESTEROLEMIA [E78.00] 03/21/2007  . Essential hypertension [I10] 03/21/2007    Total Time spent with patient: 30 minutes  Subjective:   [name] is a 35 y.o. male patient who states "I am still suicidal because of my finances and not finding a bed at a treatment center. I am not feeling safe to leave the hospital."   HPI:   Per initial Tele Assessment Note on 05/05/2016:   [name] is an 35 y.o. male. Patient with requesting detox from alcohol, drinking 8 to 9 bottles of wine a day. Denies any sobriety since his last admission here at [facility] discharged 04/27/16. Also, using $20 worth of crack cocaine daily. Patient had intoxicated when he arrived at the hospital stating he wanted to "stab himself or jump in front of a car."  After patient alcohol level was reduced patient still expressed SI to this clinician with plan to OD. No HI or A/V. Patient expressed shaking of hands and nausea. ER physician to begin CIWA protocol. Patient had at least x4 admission to [facility] this year and follows up at [facility] in [place]. Patient would confirm if he followed up at [facility] since his last admission. Patient states he is taking his medication as prescribed.   Patient was discharged on 04/27/16 with plan to contact [facility] treatment center until they had a bed. Patient states he has not been able  to remain sober and follow through with discharge plan to treatment facility. Patient  expressed hopelessness, fair sleep, decreased appetite, poor impulse control and judgement. Patient also expressed frustration about his financial issues.  Today 05/07/2016, he continues to endorse suicidal ideation with plan to overdose on medications. Patient also reports trying to walk in front of traffic prior to admission. He endorses auditory hallucinations with voices telling him to kill myself. He states he only sees demons when he is using drugs and alcohol. He denies any visual hallucinations at this time.  His urine drug screen is positive for cocaine. His blood alcohol level on admission was 389. He states his longest period of sobriety was 8 months and that was 5 years ago. He states he is followed by [employer]. He expresses frustration over not being able to be admitted directly to an inpatient treatment center for substance abuse. Patient also identifies himself as being homeless but was living with mother after recent discharge. The patient will not elaborate regarding why he is unable to return there. At this time the patient is determined to be a danger to himself based on numerous past suicidal attempts, current ideation, and due to the severity of his substance abuse problems. [name] also has a history of psychotic symptoms.   Past Psychiatric History: PTSD, bipolar disorder, schizophrenia  Risk to Self: Suicidal Ideation: Yes-Currently Present Suicidal Intent: Yes-Currently Present Is patient at risk for suicide?: Yes Suicidal Plan?: Yes-Currently Present Specify Current Suicidal Plan: Overdose or walk in front of traffic  Access to Means: Yes Specify Access to Suicidal Means: has medications What has been your use of drugs/alcohol within the last 12 months?: alcohol and cocaine Other Self Harm Risks: 45 Triggers for Past Attempts: Unpredictable Intentional Self Injurious Behavior: Cutting Risk to Others: Homicidal Ideation: No Thoughts of Harm to Others: No Current Homicidal  Intent: No Current Homicidal Plan: No Access to Homicidal Means: No Identified Victim: none History of harm to others?: No Assessment of Violence: None Noted Violent Behavior Description: pt denies Does patient have access to weapons?: No Criminal Charges Pending?: No Does patient have a court date: No Prior Inpatient Therapy: Prior Inpatient Therapy: Yes Prior Therapy Dates: 10/17, 06/17. 05/17, 03/17 Prior Therapy Facilty/Provider(s): [facility] Reason for Treatment: SI, SA Prior Outpatient Therapy: Prior Outpatient Therapy: Yes Prior Therapy Dates: Past 15 years Prior Therapy Facilty/Provider(s): [facility], [facility] Reason for Treatment: med management Does patient have an ACCT team?: No Does patient have Intensive In-House Services?  : No Does patient have [facility] services? : No Does patient have P4CC services?: No  Past Medical History:  Past Medical History:  Diagnosis Date  . Angina   . Bipolar 1 disorder ([place])   . Breast CA ([place]) dx'd 2009   bil w/ bil masectomy and oral meds  . Cancer of kidney ([place]) dx'd 04/2013   lt nephrectomy  . Coronary artery disease   . Depression   . H/O suicide attempt 2015   overdose  . Headache(784.0)   . Hypercholesteremia   . Hypertension   . Liver cirrhosis ([place])   . Pancreatitis   . Peripheral vascular disease [facility]) April 2011   Left Pop  . Schizophrenia ([place])   . Seizures ([place])   . Shortness of breath     Past Surgical History:  Procedure Laterality Date  . BREAST SURGERY    . CHEST SURGERY    . left kidney removal    . left leg surgery    . MASTECTOMY  Family History:  Family History  Problem Relation Age of Onset  . Stroke Other   . Cancer Other   . Hyperlipidemia Mother   . Hypertension Mother    Family Psychiatric  History: unknown Social History:  History  Alcohol Use  . 0.0 oz/week     History  Drug Use No    Comment: former    Social History   Social History  . Marital status: Single    Spouse name:  N/A  . Number of children: N/A  . Years of education: N/A   Social History Main Topics  . Smoking status: Current Every Day Smoker  . Smokeless tobacco: Never Used  . Alcohol use 0.0 oz/week  . Drug use: No     Comment: former  . Sexual activity: Yes    Birth control/ protection: Condom     Comment: anal   Other Topics Concern  . None   Social History Narrative   ** Merged History Encounter **       Additional Social History:    Allergies:   Allergies  Allergen Reactions  . Codeine Hives, Itching and Swelling  . Penicillins Swelling    Has patient had a PCN reaction causing immediate rash, facial/tongue/throat swelling, SOB or lightheadedness with hypotension: Yes Has patient had a PCN reaction causing severe rash involving mucus membranes or skin necrosis: Yes Has patient had a PCN reaction that required hospitalization Yes-ed visit Has patient had a PCN reaction occurring within the last 10 years: Yes If all of the above answers are "NO", then may proceed with Cephalosporin use.   . Depakote [Divalproex Sodium] Hives and Other (See Comments)    "Bug out and hallucinate"  . Morphine Itching  . Coconut Flavor Itching and Swelling  . Depakote Er [Divalproex Sodium Er] Hives  . Oxycodone Itching and Swelling  . Hydrocodone-Acetaminophen Itching and Rash    Labs:  No results found for this or any previous visit (from the past 48 hour(s)).  Current Facility-Administered Medications  Medication Dose Route Frequency Provider Last Rate Last Dose  . alum & mag hydroxide-simeth (MAALOX/MYLANTA) 200-200-20 MG/5ML suspension 30 mL  30 mL Oral PRN Delos Haring, PA-C      . amLODipine (NORVASC) tablet 5 mg  5 mg Oral Q2200 Ivor Costa, MD   5 mg at 05/06/16 2126  . atorvastatin (LIPITOR) tablet 20 mg  20 mg Oral q1800 Duffy Bruce, MD   20 mg at 05/06/16 1810  . DULoxetine (CYMBALTA) DR capsule 20 mg  20 mg Oral Daily Orlie Dakin, MD   20 mg at 05/07/16 [id number]  .  hydrOXYzine (ATARAX/VISTARIL) tablet 25 mg  25 mg Oral BID PRN Orlie Dakin, MD   25 mg at 05/06/16 1203  . ibuprofen (ADVIL,MOTRIN) tablet 600 mg  600 mg Oral Q8H PRN Delos Haring, PA-C   600 mg at 05/06/16 1011  . nicotine (NICODERM CQ - dosed in mg/24 hours) patch 21 mg  21 mg Transdermal Daily Tiffany Greene, PA-C      . ondansetron (ZOFRAN) tablet 4 mg  4 mg Oral Q8H PRN Delos Haring, PA-C   4 mg at 05/05/16 1310  . QUEtiapine (SEROQUEL) tablet 100 mg  100 mg Oral BID Orlie Dakin, MD   100 mg at 05/07/16 0834  . QUEtiapine (SEROQUEL) tablet 200 mg  200 mg Oral QHS Duffy Bruce, MD   200 mg at 05/06/16 2126  . thiamine (VITAMIN B-1) tablet 100 mg  100 mg Oral Daily  Delos Haring, PA-C   100 mg at 05/07/16 [id number]   Current Outpatient Prescriptions  Medication Sig Dispense Refill  . amLODipine (NORVASC) 5 MG tablet Take 1 tablet (5 mg total) by mouth daily. 30 tablet 0  . atorvastatin (LIPITOR) 20 MG tablet Take 1 tablet (20 mg total) by mouth daily at 6 PM. 30 tablet 0  . diphenhydrAMINE (BENADRYL) 25 MG tablet Take 25 mg by mouth every 6 (six) hours as needed for itching.    . DULoxetine (CYMBALTA) 20 MG capsule Take 20 mg by mouth daily.    . hydrOXYzine (ATARAX/VISTARIL) 25 MG tablet Take 1 tablet (25 mg total) by mouth every 6 (six) hours as needed for anxiety. 30 tablet 0  . hydrOXYzine (ATARAX/VISTARIL) 25 MG tablet Take 25 mg by mouth 2 (two) times daily.    . QUEtiapine (SEROQUEL) 100 MG tablet Take 1 tablet (100 mg total) by mouth at bedtime. (Patient taking differently: Take 100-200 mg by mouth See admin instructions. Pt takes 100mg  twice daily and 200mg  at bedtime) 30 tablet 0  . QUEtiapine (SEROQUEL) 50 MG tablet Take 1 tablet (50 mg total) by mouth 2 (two) times daily. (Patient not taking: Reported on 05/05/2016) 60 tablet 0    Musculoskeletal: Strength & Muscle Tone: within normal limits Gait & Station: normal Patient leans: N/A  Psychiatric Specialty  Exam: Physical Exam  Cardiovascular: Normal rate.   Psychiatric: His speech is normal. He expresses suicidal ideation. He expresses no homicidal ideation.    Review of Systems  Constitutional: Negative.   HENT: Negative.   Eyes: Negative.   Respiratory: Negative.   Cardiovascular: Negative.   Gastrointestinal: Negative.   Genitourinary: Negative.   Musculoskeletal: Negative.   Skin: Negative.   Neurological: Negative.   Endo/Heme/Allergies: Negative.   Psychiatric/Behavioral: Positive for depression, hallucinations, substance abuse and suicidal ideas. Negative for memory loss. The patient is nervous/anxious. The patient does not have insomnia.     Blood pressure 124/90, pulse 102, temperature 98.1 F (36.7 C), temperature source Oral, resp. rate 18, SpO2 99 %.There is no height or weight on file to calculate BMI.  General Appearance: Fairly Groomed  Eye Contact:  Good  Speech:  Clear and Coherent and Normal Rate  Volume:  Normal  Mood:  Depressed  Affect:  Depressed  Thought Process:  Coherent and Goal Directed  Orientation:  Full (Time, Place, and Person)  Thought Content:  Logical  Suicidal Thoughts:  Yes.  with intent/plan  Homicidal Thoughts:  No  Memory:  Immediate;   Good Recent;   Fair Remote;   Fair  Judgement:  Fair  Insight:  Fair  Psychomotor Activity:  Normal  Concentration:  Concentration: Fair and Attention Span: Fair  Recall:  [employer] of Knowledge:  Good  Language:  Good  Akathisia:  No  Handed:  Right  AIMS (if indicated):     Assets:  Communication Skills Desire for Improvement Housing Physical Health Resilience  ADL's:  Intact  Cognition:  WNL  Sleep:       Treatment Plan Summary:   Continue home medications as ordered. Patient is not stable to discharge and follow up with outpatient resources that were provided to him after his last discharge such as referral to [facility] and [facility]. He appears to be an imminent danger to himself  at present.   -Continue Seroquel 50 mg bid and 100 mg hs for mood stabilization/psychosis. -Continue Cymbalta 20 mg daily for depressive symptoms.   Disposition: Recommend psychiatric  Inpatient admission when medically cleared.  Elmarie Shiley, [place] 05/07/2016 10:06 AM

## 2016-05-07 NOTE — ED Notes (Signed)
Patient speaking with mother on the telephone at this time.

## 2016-05-07 NOTE — ED Notes (Signed)
TTS machine at bedside. 

## 2016-05-07 NOTE — ED Notes (Signed)
Pt making phone call on phone at nursing station

## 2016-05-07 NOTE — ED Notes (Signed)
Pt. Showering at this time.

## 2016-05-07 NOTE — ED Notes (Signed)
Pt ambulated to restroom with sitter in tow.

## 2016-05-07 NOTE — ED Notes (Signed)
Patient was given a Ginger Ale. A regular diet ordered for lunch.

## 2016-05-08 MED ORDER — LOPERAMIDE HCL 2 MG PO CAPS
2.0000 mg | ORAL_CAPSULE | Freq: Once | ORAL | Status: AC
  Administered 2016-05-08: 2 mg via ORAL
  Filled 2016-05-08: qty 1

## 2016-05-08 NOTE — ED Notes (Signed)
Vistaril given as requested.

## 2016-05-08 NOTE — ED Notes (Signed)
Parents visiting w/pt. RN answered questions/concerns they had per pt's request.

## 2016-05-09 NOTE — ED Provider Notes (Signed)
Behavioral health has advised me they have completed their assessment and determine the patient is not an active suicidal threat. They feel he is appropriate for continued outpatient treatment and management. Advice is for discharge.  I have reassessed the patient. He is alert and nontoxic. He is showing no signs of distress. Ports he feels improved and is comfortable this follow-up plan. He has good eye contact and follows direction without difficulty. Heart and lungs normal. No tremor and coordinated symmetric movements and gait.   Charlesetta Shanks, MD 05/09/16 [phone]

## 2016-05-09 NOTE — Progress Notes (Signed)
Patient seen today via tele-assessment follow up. The patient states "I am still frustrated that I could not get into a long term program. That is why I have been feeling suicidal." [name] is calm and cooperative during assessment. He was provided with resources during his recent admission to pursue desired treatment. Nursing staff report that his parents have been visiting and appear to be a support. Patient reported during consult on 05/07/2016 that he was previously living with mother. Discussed case with TTS team. Patient was recently discharged in stable condition with denial of suicidal ideation on 04/27/2016 from [facility] His most recent discharge summary outlines the plan to follow-up with [facility] and [facility]. Due to the chronic nature of his suicidal thoughts the TTS team including Dr. Dwyane Dee feels that patient my discharged from the ED today to follow up with resources to address his substance abuse. Due to his recent inpatient admission at [facility] from 04/22/2016-04/27/2016 the patient would not benefit from being brought inpatient at this time.

## 2016-05-09 NOTE — ED Notes (Addendum)
IVC paperwork rescinded by Dr Johnney Killian - copy faxed to [facility], copy sent to Medical Records, and original placed in folder for [employer].

## 2016-05-09 NOTE — ED Notes (Signed)
Ginger Ale and ice cream given for snack as requested.

## 2016-05-09 NOTE — B[facility] Counselor (Signed)
Reassessed Pt, who was resting on his hospital bed.  Pt reported suicidal ideation (no plan or stated intent) due to his perceived inability to enter substance use rehab/treatment facility.  Per report, Pt has situational suicidal ideation around his failure to obtain treatment, and that his suicidal ideation will lift upon entry into treatment.  "What I want is to go into a long-term place where I can get help."  Pt denied homicidal ideation, auditory/visual hallucination, self-injury.  There was no evidence of delusion. Pt's thought processes were within normal limits, and thought content was goal-oriented and logical.  Per report, Pt has been very calm and cooperative.

## 2016-05-09 NOTE — ED Notes (Signed)
Pt signed "No Harm Contract" - copy given to pt w/resources and bus pass.

## 2016-05-10 ENCOUNTER — Encounter (HOSPITAL_COMMUNITY): Payer: Self-pay | Admitting: Family Medicine

## 2016-05-10 ENCOUNTER — Emergency Department (HOSPITAL_COMMUNITY)
Admission: EM | Admit: 2016-05-10 | Discharge: 2016-05-11 | Disposition: A | Discharge status: Home / Self Care | Payer: No Typology Code available for payment source | Attending: Emergency Medicine | Admitting: Emergency Medicine

## 2016-05-10 DIAGNOSIS — I10 Essential (primary) hypertension: Secondary | ICD-10-CM | POA: Insufficient documentation

## 2016-05-10 DIAGNOSIS — I2510 Atherosclerotic heart disease of native coronary artery without angina pectoris: Secondary | ICD-10-CM | POA: Insufficient documentation

## 2016-05-10 DIAGNOSIS — F14288 Cocaine dependence with other cocaine-induced disorder: Secondary | ICD-10-CM | POA: Diagnosis present

## 2016-05-10 DIAGNOSIS — F1014 Alcohol abuse with alcohol-induced mood disorder: Secondary | ICD-10-CM | POA: Diagnosis present

## 2016-05-10 DIAGNOSIS — R45851 Suicidal ideations: Secondary | ICD-10-CM

## 2016-05-10 DIAGNOSIS — Z5181 Encounter for therapeutic drug level monitoring: Secondary | ICD-10-CM | POA: Insufficient documentation

## 2016-05-10 DIAGNOSIS — F10120 Alcohol abuse with intoxication, uncomplicated: Secondary | ICD-10-CM | POA: Insufficient documentation

## 2016-05-10 DIAGNOSIS — F1910 Other psychoactive substance abuse, uncomplicated: Secondary | ICD-10-CM | POA: Insufficient documentation

## 2016-05-10 DIAGNOSIS — F1410 Cocaine abuse, uncomplicated: Secondary | ICD-10-CM | POA: Insufficient documentation

## 2016-05-10 DIAGNOSIS — F17200 Nicotine dependence, unspecified, uncomplicated: Secondary | ICD-10-CM | POA: Insufficient documentation

## 2016-05-10 DIAGNOSIS — F10920 Alcohol use, unspecified with intoxication, uncomplicated: Secondary | ICD-10-CM

## 2016-05-10 DIAGNOSIS — F1020 Alcohol dependence, uncomplicated: Secondary | ICD-10-CM

## 2016-05-10 LAB — COMPREHENSIVE METABOLIC PANEL
ALK PHOS: 58 U/L (ref 38–126)
ALT: 27 U/L (ref 17–63)
AST: 37 U/L (ref 15–41)
Albumin: 4.7 g/dL (ref 3.5–5.0)
Anion gap: 11 (ref 5–15)
BUN: 15 mg/dL (ref 6–20)
CALCIUM: 9 mg/dL (ref 8.9–10.3)
CO2: 21 mmol/L — ABNORMAL LOW (ref 22–32)
CREATININE: 0.89 mg/dL (ref 0.61–1.24)
Chloride: 108 mmol/L (ref 101–111)
Glucose, Bld: 99 mg/dL (ref 65–99)
Potassium: 3.4 mmol/L — ABNORMAL LOW (ref 3.5–5.1)
Sodium: 140 mmol/L (ref 135–145)
Total Bilirubin: 0.5 mg/dL (ref 0.3–1.2)
Total Protein: 7.9 g/dL (ref 6.5–8.1)

## 2016-05-10 LAB — CBC WITH DIFFERENTIAL/PLATELET
BASOS ABS: 0 10*3/uL (ref 0.0–0.1)
Basophils Relative: 0 %
EOS ABS: 0 10*3/uL (ref 0.0–0.7)
EOS PCT: 0 %
HCT: 44.9 % (ref 39.0–52.0)
Hemoglobin: 15.8 g/dL (ref 13.0–17.0)
LYMPHS ABS: 2.6 10*3/uL (ref 0.7–4.0)
Lymphocytes Relative: 37 %
MCH: 29.2 pg (ref 26.0–34.0)
MCHC: 35.2 g/dL (ref 30.0–36.0)
MCV: 82.8 fL (ref 78.0–100.0)
MONO ABS: 0.4 10*3/uL (ref 0.1–1.0)
Monocytes Relative: 5 %
Neutro Abs: 4 10*3/uL (ref 1.7–7.7)
Neutrophils Relative %: 58 %
PLATELETS: 191 10*3/uL (ref 150–400)
RBC: 5.42 MIL/uL (ref 4.22–5.81)
RDW: 14.5 % (ref 11.5–15.5)
WBC: 6.9 10*3/uL (ref 4.0–10.5)

## 2016-05-10 LAB — RAPID URINE DRUG SCREEN, HOSP PERFORMED
AMPHETAMINES: NOT DETECTED
Barbiturates: NOT DETECTED
Benzodiazepines: NOT DETECTED
Cocaine: POSITIVE — AB
OPIATES: NOT DETECTED
Tetrahydrocannabinol: NOT DETECTED

## 2016-05-10 LAB — MAGNESIUM: MAGNESIUM: 2.3 mg/dL (ref 1.7–2.4)

## 2016-05-10 LAB — ETHANOL: ALCOHOL ETHYL (B): 415 mg/dL — AB (ref <5)

## 2016-05-10 MED ORDER — NICOTINE 21 MG/24HR TD PT24
21.0000 mg | MEDICATED_PATCH | Freq: Every day | TRANSDERMAL | Status: DC
  Filled 2016-05-10: qty 1

## 2016-05-10 MED ORDER — LORAZEPAM 1 MG PO TABS: 0.0000 mg | ORAL_TABLET | Freq: Two times a day (BID) | ORAL | Status: DC

## 2016-05-10 MED ORDER — THIAMINE HCL 100 MG/ML IJ SOLN: 100.0000 mg | Freq: Every day | INTRAMUSCULAR | Status: DC

## 2016-05-10 MED ORDER — ALUM & MAG HYDROXIDE-SIMETH 200-200-20 MG/5ML PO SUSP: 30.0000 mL | ORAL | Status: DC | PRN

## 2016-05-10 MED ORDER — QUETIAPINE FUMARATE 100 MG PO TABS: 100.0000 mg | ORAL_TABLET | ORAL | Status: DC

## 2016-05-10 MED ORDER — ATORVASTATIN CALCIUM 20 MG PO TABS
20.0000 mg | ORAL_TABLET | Freq: Every day | ORAL | Status: DC
  Administered 2016-05-11: 20 mg via ORAL
  Filled 2016-05-10: qty 1

## 2016-05-10 MED ORDER — ONDANSETRON HCL 4 MG PO TABS: 4.0000 mg | ORAL_TABLET | Freq: Three times a day (TID) | ORAL | Status: DC | PRN

## 2016-05-10 MED ORDER — HYDROXYZINE HCL 25 MG PO TABS: 25.0000 mg | ORAL_TABLET | Freq: Four times a day (QID) | ORAL | Status: DC | PRN

## 2016-05-10 MED ORDER — VITAMIN B-1 100 MG PO TABS
100.0000 mg | ORAL_TABLET | Freq: Every day | ORAL | Status: DC
  Administered 2016-05-10 – 2016-05-11 (×2): 100 mg via ORAL
  Filled 2016-05-10 (×2): qty 1

## 2016-05-10 MED ORDER — DULOXETINE HCL 20 MG PO CPEP
20.0000 mg | ORAL_CAPSULE | Freq: Every day | ORAL | Status: DC
  Administered 2016-05-11 (×2): 20 mg via ORAL
  Filled 2016-05-10 (×2): qty 1

## 2016-05-10 MED ORDER — AMLODIPINE BESYLATE 5 MG PO TABS
5.0000 mg | ORAL_TABLET | Freq: Every day | ORAL | Status: DC
  Administered 2016-05-11: 5 mg via ORAL
  Filled 2016-05-10: qty 1

## 2016-05-10 MED ORDER — IBUPROFEN 200 MG PO TABS
600.0000 mg | ORAL_TABLET | Freq: Three times a day (TID) | ORAL | Status: DC | PRN
  Administered 2016-05-10: 600 mg via ORAL
  Filled 2016-05-10: qty 3

## 2016-05-10 MED ORDER — DIPHENHYDRAMINE HCL 25 MG PO CAPS: 25.0000 mg | ORAL_CAPSULE | Freq: Four times a day (QID) | ORAL | Status: DC | PRN

## 2016-05-10 MED ORDER — LORAZEPAM 1 MG PO TABS
0.0000 mg | ORAL_TABLET | Freq: Four times a day (QID) | ORAL | Status: DC
  Administered 2016-05-10: 1 mg via ORAL
  Filled 2016-05-10: qty 1

## 2016-05-10 NOTE — ED Notes (Signed)
Patient smells of alcohol. He is alert, oriented to person, disoriented to time, place, and situation. Pt is unable to answer questions appropriately.

## 2016-05-10 NOTE — ED Notes (Signed)
Gave report to Wille Glaser, Therapist, sports.

## 2016-05-10 NOTE — ED Provider Notes (Signed)
[facility] DEPT Provider Note   CSN: [id number] Arrival date & time: 05/10/16  1717     History   Chief Complaint Chief Complaint  Patient presents with  . Alcohol Intoxication    HPI [name] is a 35 y.o. male.  Pt presents to the ED today with alcohol intoxication and si.  Pt said that his parents called the paramedics because he was sitting on his neighbor's front lawn.  Family told EMS that he was apparently supposed to be admitted to [facility] 2 days ago, but due to lack of insurance, he did not go.  However, pt was here and evaluated by [facility] and was no longer actively suicidal, so he was deemed medically cleared for outpatient treatment, and was d/c'd.      Past Medical History:  Diagnosis Date  . Angina   . Bipolar 1 disorder ([place])   . Breast CA ([place]) dx'd 2009   bil w/ bil masectomy and oral meds  . Cancer of kidney ([place]) dx'd 04/2013   lt nephrectomy  . Coronary artery disease   . Depression   . H/O suicide attempt 2015   overdose  . Headache(784.0)   . Hypercholesteremia   . Hypertension   . Liver cirrhosis ([place])   . Pancreatitis   . Peripheral vascular disease [facility]) April 2011   Left Pop  . Schizophrenia ([place])   . Seizures ([place])   . Shortness of breath     Patient Active Problem List   Diagnosis Date Noted  . Insomnia   . HTN (hypertension), benign   . Severe episode of recurrent major depressive disorder, without psychotic features ([place])   . Major depressive disorder, recurrent episode, severe, with psychotic behavior ([place]) 12/30/2015  . Depression, major, severe recurrence ([place]) 12/30/2015  . Substance induced mood disorder ([place]) 12/02/2015  . Mood disorder in conditions classified elsewhere   . MDD (major depressive disorder), recurrent severe, without psychosis ([place]) 10/08/2015  . Malnutrition of moderate degree 09/24/2015  . Alcohol withdrawal ([place]) 09/23/2015  . Tobacco use disorder 07/16/2015  . Elevated transaminase level   . Drug  overdose, intentional ([place]) 07/12/2015  . Alcohol use disorder, severe, dependence ([place]) 04/15/2015  . Cocaine abuse with cocaine-induced mood disorder ([place]) 04/11/2015  . Overdose 04/10/2015  . Severe recurrent major depressive disorder with psychotic features ([place])   . Severe major depression with psychotic features ([place]) 09/11/2014  . Alcohol-induced mood disorder ([place]) 09/10/2014  . Suicidal ideation   . Tylenol overdose   . Polysubstance abuse   . Overdose of acetaminophen 08/03/2014  . Overdose by acetaminophen 08/03/2014  . Cocaine abuse 04/16/2014  . Thrombocytopenia ([place]) 04/15/2014  . Urinary tract infection, site not specified 04/15/2014  . Chest pain, unspecified 04/15/2014  . D-dimer, elevated 04/15/2014  . Transaminitis 09/24/2013  . Cocaine dependence ([place]) 09/20/2013  . S/p nephrectomy 04/28/2013  . Seizure ([place]) 03/15/2013  . Syncope 01/02/2013  . Leukocytopenia, unspecified 01/02/2013  . Left kidney mass 12/24/2012  . PTSD (post-traumatic stress disorder) 07/06/2012  . Peripheral vascular disease ([place]) 01/14/2012  . SEIZURE DISORDER 10/03/2008  . LIVER FUNCTION TESTS, ABNORMAL 12/29/2007  . HYPERCHOLESTEROLEMIA 03/21/2007  . Essential hypertension 03/21/2007    Past Surgical History:  Procedure Laterality Date  . BREAST SURGERY    . CHEST SURGERY    . left kidney removal    . left leg surgery    . MASTECTOMY         Home Medications    Prior to  Admission medications   Medication Sig Start Date End Date Taking? Authorizing Provider  amLODipine (NORVASC) 5 MG tablet Take 1 tablet (5 mg total) by mouth daily. 04/28/16  Yes Kerrie [name], NP  atorvastatin (LIPITOR) 20 MG tablet Take 1 tablet (20 mg total) by mouth daily at 6 PM. 04/27/16  Yes Kerrie [name], NP  diphenhydrAMINE (BENADRYL) 25 MG tablet Take 25 mg by mouth every 6 (six) hours as needed for itching.   Yes Historical Provider, MD  hydrOXYzine (ATARAX/VISTARIL) 25 MG tablet Take 1 tablet  (25 mg total) by mouth every 6 (six) hours as needed for anxiety. 04/27/16  Yes Kerrie [name], NP  QUEtiapine (SEROQUEL) 100 MG tablet Take 1 tablet (100 mg total) by mouth at bedtime. Patient taking differently: Take 100-200 mg by mouth See admin instructions. Pt takes 100mg  twice daily and 200mg  at bedtime 04/27/16  Yes Kerrie [name], NP  QUEtiapine (SEROQUEL) 50 MG tablet Take 1 tablet (50 mg total) by mouth 2 (two) times daily. Patient not taking: Reported on 05/05/2016 04/27/16   Kerrie [name], NP    Family History Family History  Problem Relation Age of Onset  . Stroke Other   . Cancer Other   . Hyperlipidemia Mother   . Hypertension Mother     Social History Social History  Substance Use Topics  . Smoking status: Current Every Day Smoker  . Smokeless tobacco: Never Used  . Alcohol use 0.0 oz/week     Allergies   Codeine; Penicillins; Depakote [divalproex sodium]; Morphine; Coconut flavor; Depakote er [divalproex sodium er]; Oxycodone; and Hydrocodone-acetaminophen   Review of Systems Review of Systems  Psychiatric/Behavioral: Positive for suicidal ideas.  All other systems reviewed and are negative.    Physical Exam Updated Vital Signs BP 124/76 (BP Location: Right Arm)   Pulse 94   Temp 98.5 F (36.9 C) (Oral)   Resp 17   SpO2 97%   Physical Exam  Constitutional: He is oriented to person, place, and time. He appears well-developed and well-nourished.  HENT:  Head: Normocephalic and atraumatic.  Right Ear: External ear normal.  Left Ear: External ear normal.  Nose: Nose normal.  Mouth/Throat: Oropharynx is clear and moist.  Eyes: EOM are normal. Pupils are equal, round, and reactive to light.  Neck: Normal range of motion. Neck supple.  Cardiovascular: Normal rate, regular rhythm, normal heart sounds and intact distal pulses.   Pulmonary/Chest: Effort normal and breath sounds normal.  Abdominal: Soft. Bowel sounds are normal.  Musculoskeletal:  Normal range of motion.  Neurological: He is alert and oriented to person, place, and time.  Skin: Skin is warm.  Psychiatric: He has a normal mood and affect. His behavior is normal. Judgment and thought content normal.  Nursing note and vitals reviewed.    ED Treatments / Results  Labs (all labs ordered are listed, but only abnormal results are displayed) Labs Reviewed  COMPREHENSIVE METABOLIC PANEL - Abnormal; Notable for the following:       Result Value   Potassium 3.4 (*)    CO2 21 (*)    All other components within normal limits  ETHANOL - Abnormal; Notable for the following:    Alcohol, Ethyl (B) 415 (*)    All other components within normal limits  RAPID URINE DRUG SCREEN, HOSP PERFORMED - Abnormal; Notable for the following:    Cocaine POSITIVE (*)    All other components within normal limits  CBC WITH DIFFERENTIAL/PLATELET  MAGNESIUM    EKG  EKG Interpretation  None     EKG (not coming through on MUSE):  HR 99.  PVCs, nonspecific T wave deformity  Radiology No results found.  Procedures Procedures (including critical care time)  Medications Ordered in ED Medications  thiamine (VITAMIN B-1) tablet 100 mg (100 mg Oral Given 05/10/16 2045)    Or  thiamine (B-1) injection 100 mg ( Intravenous See Alternative 05/10/16 2045)  LORazepam (ATIVAN) tablet 0-4 mg (1 mg Oral Given 05/10/16 2102)    Followed by  LORazepam (ATIVAN) tablet 0-4 mg (not administered)  ibuprofen (ADVIL,MOTRIN) tablet 600 mg (600 mg Oral Given 05/10/16 2045)  nicotine (NICODERM CQ - dosed in mg/24 hours) patch 21 mg (not administered)  ondansetron (ZOFRAN) tablet 4 mg (not administered)  alum & mag hydroxide-simeth (MAALOX/MYLANTA) 200-200-20 MG/5ML suspension 30 mL (not administered)  amLODipine (NORVASC) tablet 5 mg (not administered)  atorvastatin (LIPITOR) tablet 20 mg (not administered)  diphenhydrAMINE (BENADRYL) capsule 25 mg (not administered)  DULoxetine (CYMBALTA) DR capsule 20  mg (not administered)  hydrOXYzine (ATARAX/VISTARIL) tablet 25 mg (not administered)  QUEtiapine (SEROQUEL) tablet 100-200 mg (not administered)     Initial Impression / Assessment and Plan / ED Course  I have reviewed the triage vital signs and the nursing notes.  Pertinent labs & imaging results that were available during my care of the patient were reviewed by me and considered in my medical decision making (see chart for details).  Clinical Course   TTS consult ordered.  Psych orders put in computer.  Pt placed on CIWA precautions.  Disposition pending TTS eval.  Final Clinical Impressions(s) / ED Diagnoses   Final diagnoses:  Alcoholic intoxication without complication ([place])  Polysubstance abuse  Suicidal ideation    New Prescriptions New Prescriptions   No medications on file     Isla Pence, MD 05/11/16 0007

## 2016-05-10 NOTE — ED Triage Notes (Signed)
Per EMS, Pt was sitting on a neighbors front lawn and 911 was called. Pt was supposed tobe admitted to [facility] two days ago but sts due to alcohol intoxication and insurance he couldn't be admitted. Per pt family, "they are trying to get him detox and then back to a facility."  Pt has been missing x 2 days. Pt appears intoxicated. A&Ox4. Not ambulatory.

## 2016-05-10 NOTE — ED Notes (Signed)
Bed: [id number] Expected date:  Expected time:  Means of arrival:  Comments: [employer]

## 2016-05-11 ENCOUNTER — Ambulatory Visit (HOSPITAL_COMMUNITY): Admit: 2016-05-11 | Payer: Self-pay

## 2016-05-11 DIAGNOSIS — Z823 Family history of stroke: Secondary | ICD-10-CM

## 2016-05-11 DIAGNOSIS — Z809 Family history of malignant neoplasm, unspecified: Secondary | ICD-10-CM

## 2016-05-11 DIAGNOSIS — F17210 Nicotine dependence, cigarettes, uncomplicated: Secondary | ICD-10-CM | POA: Diagnosis not present

## 2016-05-11 DIAGNOSIS — F1410 Cocaine abuse, uncomplicated: Secondary | ICD-10-CM

## 2016-05-11 DIAGNOSIS — F1014 Alcohol abuse with alcohol-induced mood disorder: Secondary | ICD-10-CM | POA: Diagnosis not present

## 2016-05-11 DIAGNOSIS — Z880 Allergy status to penicillin: Secondary | ICD-10-CM

## 2016-05-11 DIAGNOSIS — Z888 Allergy status to other drugs, medicaments and biological substances status: Secondary | ICD-10-CM

## 2016-05-11 DIAGNOSIS — Z8489 Family history of other specified conditions: Secondary | ICD-10-CM

## 2016-05-11 DIAGNOSIS — Z79899 Other long term (current) drug therapy: Secondary | ICD-10-CM

## 2016-05-11 NOTE — ED Notes (Signed)
Patient at nurse's desk stating that he doesn't want to go to Obs at [facility] he just wants to be discharged home.   Made [place] with TTS aware.
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
## 2016-05-11 NOTE — ED Notes (Signed)
Attempted to draw blood x1. Unable to get blood.

## 2016-05-11 NOTE — Consult Note (Signed)
[facility] Face-to-Face Psychiatry Consult   Reason for Consult:  Alcohol intoxication  Referring Physician:  EDP Patient Identification: Gina Costilla MRN:  [id number] Principal Diagnosis: Alcohol abuse with alcohol-induced mood disorder [facility]) Diagnosis:   Patient Active Problem List   Diagnosis Date Noted  . Alcohol use disorder, severe, dependence ([place]) [F10.20] 04/15/2015    Priority: High  . Cocaine abuse [F14.10] 04/16/2014    Priority: High  . Alcohol abuse with alcohol-induced mood disorder ([place]) [F10.14] 09/20/2013    Priority: High  . PTSD (post-traumatic stress disorder) [F43.10] 07/06/2012    Priority: High  . Major depressive disorder, recurrent episode, severe, with psychotic behavior ([place]) [F33.3] 12/30/2015    Priority: Low  . Insomnia [G47.00]   . HTN (hypertension), benign [I10]   . Severe episode of recurrent major depressive disorder, without psychotic features ([place]) [F33.2]   . Depression, major, severe recurrence ([place]) [F33.2] 12/30/2015  . Substance induced mood disorder ([place]) [F19.94] 12/02/2015  . Mood disorder in conditions classified elsewhere [F06.30]   . MDD (major depressive disorder), recurrent severe, without psychosis ([place]) [F33.2] 10/08/2015  . Malnutrition of moderate degree [E44.0] 09/24/2015  . Alcohol withdrawal ([place]) [F10.239] 09/23/2015  . Tobacco use disorder [F17.200] 07/16/2015  . Elevated transaminase level [R74.0]   . Drug overdose, intentional ([place]) [T50.902A] 07/12/2015  . Cocaine abuse with cocaine-induced mood disorder ([place]) [F14.14] 04/11/2015  . Overdose [T50.901A] 04/10/2015  . Severe recurrent major depressive disorder with psychotic features ([place]) [F33.3]   . Severe major depression with psychotic features ([place]) [F32.3] 09/11/2014  . Alcohol-induced mood disorder ([place]) [F10.94] 09/10/2014  . Suicidal ideation [R45.851]   . Tylenol overdose [T39.1X1A]   . Polysubstance abuse [F19.10]   . Overdose of acetaminophen [T39.1X1A] 08/03/2014   . Overdose by acetaminophen [T39.1X1A] 08/03/2014  . Thrombocytopenia ([place]) [D69.6] 04/15/2014  . Urinary tract infection, site not specified [N39.0] 04/15/2014  . Chest pain, unspecified [R07.9] 04/15/2014  . D-dimer, elevated [R79.89] 04/15/2014  . Transaminitis [R74.0] 09/24/2013  . Cocaine dependence ([place]) [F14.20] 09/20/2013  . 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 Seizure ([place]) [R56.9] 03/15/2013  . Syncope [R55] 01/02/2013  . Leukocytopenia, unspecified [D72.819] 01/02/2013  . Left kidney mass [N28.89] 12/24/2012  . Peripheral vascular disease ([place]) [I73.9] 01/14/2012  . SEIZURE DISORDER [R56.9] 10/03/2008  . LIVER FUNCTION TESTS, ABNORMAL [R94.5] 12/29/2007  . HYPERCHOLESTEROLEMIA [E78.00] 03/21/2007  . Essential hypertension [I10] 03/21/2007    Total Time spent with patient: 45 minutes  Subjective:   Glennie Rodda is a 35 y.o. male patient wanted to leave even though [facility] Obs offered.  HPI:  35 yo male who was brought into the hospital after found intoxicated.  He left  on Sunday and returned the next day (yesterday) after drinking and using cocaine.  His family thought he was missing because he did not tell them he was in the ED, lives with his parents. Ollin had recently been on the inpatient unit at [facility] prior to going to [facility] ED.  He was suppose to have gone to rehab but blames everyone else for him not going.  Encouraged him to use his resources that were provided, [facility] Unit was offered but he later refused.  No suicidal/homicidal ideations, hallucinations, or withdrawal symptoms.  Past Psychiatric History: substance abuse, depression  Risk to Self: Suicidal Ideation: Yes-Currently Present Suicidal Intent: Yes-Currently Present Is patient at risk for suicide?: Yes Suicidal Plan?: Yes-Currently Present Specify Current Suicidal Plan: pt reports a plan to OD on medication Access to Means: Yes  Specify Access to Suicidal Means: pt has access to medication What  has been your use of drugs/alcohol within the last 12 months?: reports daily alcohol and cocaine use How many times?: 30 Triggers for Past Attempts: Other (Comment) (being rejected, financial issues) Intentional Self Injurious Behavior: Cutting Comment - Self Injurious Behavior: pt reports he cut himself as recently as 2 weeks ago Risk to Others: Homicidal Ideation: No Thoughts of Harm to Others: No Current Homicidal Intent: No Current Homicidal Plan: No Access to Homicidal Means: No History of harm to others?: No Assessment of Violence: None Noted Does patient have access to weapons?: No Criminal Charges Pending?: No Does patient have a court date: Yes Court Date: 05/28/16 ([place]) Prior Inpatient Therapy: Prior Inpatient Therapy: Yes Prior Therapy Dates: 10/17, 06/17. 05/17, 03/17, 05/17 Prior Therapy Facilty/Provider(s): [place], [place] Reason for Treatment: SI, SA Prior Outpatient Therapy: Prior Outpatient Therapy: Yes Prior Therapy Dates: current Prior Therapy Facilty/Provider(s): [facility] Reason for Treatment: med management Does patient have an ACCT team?: No Does patient have Intensive In-House Services?  : No Does patient have [facility] services? : Yes Does patient have P4CC services?: No  Past Medical History:  Past Medical History:  Diagnosis Date  . Angina   . Bipolar 1 disorder ([place])   . Breast CA ([place]) dx'd 2009   bil w/ bil masectomy and oral meds  . Cancer of kidney ([place]) dx'd 04/2013   lt nephrectomy  . Coronary artery disease   . Depression   . H/O suicide attempt 2015   overdose  . Headache(784.0)   . Hypercholesteremia   . Hypertension   . Liver cirrhosis ([place])   . Pancreatitis   . Peripheral vascular disease [facility]) April 2011   Left Pop  . Schizophrenia ([place])   . Seizures ([place])   . Shortness of breath     Past Surgical History:  Procedure Laterality Date  . BREAST SURGERY    . CHEST SURGERY    . left kidney removal    . left leg surgery    .  MASTECTOMY     Family History:  Family History  Problem Relation Age of Onset  . Stroke Other   . Cancer Other   . Hyperlipidemia Mother   . Hypertension Mother    Family Psychiatric  History: none Social History:  History  Alcohol Use  . 0.0 oz/week     History  Drug Use No    Comment: Former    Social History   Social History  . Marital status: Single    Spouse name: N/A  . Number of children: N/A  . Years of education: N/A   Social History Main Topics  . Smoking status: Current Every Day Smoker  . Smokeless tobacco: Never Used  . Alcohol use 0.0 oz/week  . Drug use: No     Comment: Former  . Sexual activity: Yes    Birth control/ protection: Condom     Comment: anal   Other Topics Concern  . None   Social History Narrative   ** Merged History Encounter **       Additional Social History:    Allergies:   Allergies  Allergen Reactions  . Codeine Hives, Itching and Swelling  . Penicillins Swelling    Has patient had a PCN reaction causing immediate rash, facial/tongue/throat swelling, SOB or lightheadedness with hypotension: Yes Has patient had a PCN reaction causing severe rash involving mucus membranes or skin necrosis: Yes Has patient had a PCN  reaction that required hospitalization Yes-ed visit Has patient had a PCN reaction occurring within the last 10 years: Yes If all of the above answers are "NO", then may proceed with Cephalosporin use.   . Depakote [Divalproex Sodium] Hives and Other (See Comments)    "Bug out and hallucinate"  . Morphine Itching  . Coconut Flavor Itching and Swelling  . Depakote Er [Divalproex Sodium Er] Hives  . Oxycodone Itching and Swelling  . Hydrocodone-Acetaminophen Itching and Rash    Labs:  Results for orders placed or performed during the hospital encounter of 05/10/16 (from the past 48 hour(s))  Urine rapid drug screen (hosp performed)not at [facility]     Status: Abnormal   Collection Time: 05/10/16  5:38 PM   Result Value Ref Range   Opiates NONE DETECTED NONE DETECTED   Cocaine POSITIVE (A) NONE DETECTED   Benzodiazepines NONE DETECTED NONE DETECTED   Amphetamines NONE DETECTED NONE DETECTED   Tetrahydrocannabinol NONE DETECTED NONE DETECTED   Barbiturates NONE DETECTED NONE DETECTED    Comment:        DRUG SCREEN FOR MEDICAL PURPOSES ONLY.  IF CONFIRMATION IS NEEDED FOR ANY PURPOSE, NOTIFY LAB WITHIN 5 DAYS.        LOWEST DETECTABLE LIMITS FOR URINE DRUG SCREEN Drug Class       Cutoff (ng/mL) Amphetamine      1000 Barbiturate      200 Benzodiazepine   937 Tricyclics       342 Opiates          300 Cocaine          300 THC              50   Comprehensive metabolic panel     Status: Abnormal   Collection Time: 05/10/16  5:40 PM  Result Value Ref Range   Sodium 140 135 - 145 mmol/L   Potassium 3.4 (L) 3.5 - 5.1 mmol/L   Chloride 108 101 - 111 mmol/L   CO2 21 (L) 22 - 32 mmol/L   Glucose, Bld 99 65 - 99 mg/dL   BUN 15 6 - 20 mg/dL   Creatinine, Ser 0.89 0.61 - 1.24 mg/dL   Calcium 9.0 8.9 - 10.3 mg/dL   Total Protein 7.9 6.5 - 8.1 g/dL   Albumin 4.7 3.5 - 5.0 g/dL   AST 37 15 - 41 U/L   ALT 27 17 - 63 U/L   Alkaline Phosphatase 58 38 - 126 U/L   Total Bilirubin 0.5 0.3 - 1.2 mg/dL   GFR calc non Af Amer >60 >60 mL/min   GFR calc Af Amer >60 >60 mL/min    Comment: (NOTE) The eGFR has been calculated using the CKD EPI equation. This calculation has not been validated in all clinical situations. eGFR's persistently <60 mL/min signify possible Chronic Kidney Disease.    Anion gap 11 5 - 15  Ethanol     Status: Abnormal   Collection Time: 05/10/16  5:40 PM  Result Value Ref Range   Alcohol, Ethyl (B) 415 (HH) <5 mg/dL    Comment:        LOWEST DETECTABLE LIMIT FOR SERUM ALCOHOL IS 5 mg/dL FOR MEDICAL PURPOSES ONLY CRITICAL RESULT CALLED TO, READ BACK BY AND VERIFIED WITH: HALL,C AT 1819 ON 05/10/16 BY MOSLEY,J   CBC with Diff     Status: None   Collection Time:  05/10/16  5:40 PM  Result Value Ref Range   WBC 6.9 4.0 - 10.5  K/uL   RBC 5.42 4.22 - 5.81 MIL/uL   Hemoglobin 15.8 13.0 - 17.0 g/dL   HCT 44.9 39.0 - 52.0 %   MCV 82.8 78.0 - 100.0 fL   MCH 29.2 26.0 - 34.0 pg   MCHC 35.2 30.0 - 36.0 g/dL   RDW 14.5 11.5 - 15.5 %   Platelets 191 150 - 400 K/uL   Neutrophils Relative % 58 %   Neutro Abs 4.0 1.7 - 7.7 K/uL   Lymphocytes Relative 37 %   Lymphs Abs 2.6 0.7 - 4.0 K/uL   Monocytes Relative 5 %   Monocytes Absolute 0.4 0.1 - 1.0 K/uL   Eosinophils Relative 0 %   Eosinophils Absolute 0.0 0.0 - 0.7 K/uL   Basophils Relative 0 %   Basophils Absolute 0.0 0.0 - 0.1 K/uL  Magnesium     Status: None   Collection Time: 05/10/16  5:40 PM  Result Value Ref Range   Magnesium 2.3 1.7 - 2.4 mg/dL    Current Facility-Administered Medications  Medication Dose Route Frequency Provider Last Rate Last Dose  . alum & mag hydroxide-simeth (MAALOX/MYLANTA) 200-200-20 MG/5ML suspension 30 mL  30 mL Oral PRN Isla Pence, MD      . amLODipine (NORVASC) tablet 5 mg  5 mg Oral Daily Isla Pence, MD   5 mg at 05/11/16 0159  . atorvastatin (LIPITOR) tablet 20 mg  20 mg Oral q1800 Isla Pence, MD   20 mg at 05/11/16 0156  . diphenhydrAMINE (BENADRYL) capsule 25 mg  25 mg Oral Q6H PRN Isla Pence, MD      . DULoxetine (CYMBALTA) DR capsule 20 mg  20 mg Oral Daily Isla Pence, MD   20 mg at 05/11/16 0155  . hydrOXYzine (ATARAX/VISTARIL) tablet 25 mg  25 mg Oral Q6H PRN Isla Pence, MD      . ibuprofen (ADVIL,MOTRIN) tablet 600 mg  600 mg Oral Q8H PRN Isla Pence, MD   600 mg at 05/10/16 2045  . LORazepam (ATIVAN) tablet 0-4 mg  0-4 mg Oral Q6H Isla Pence, MD   1 mg at 05/10/16 2102   Followed by  . [START ON 05/12/2016] LORazepam (ATIVAN) tablet 0-4 mg  0-4 mg Oral Q12H Isla Pence, MD      . nicotine (NICODERM CQ - dosed in mg/24 hours) patch 21 mg  21 mg Transdermal Daily Isla Pence, MD      . ondansetron [facility]) tablet 4 mg  4  mg Oral Q8H PRN Isla Pence, MD      . QUEtiapine (SEROQUEL) tablet 100-200 mg  100-200 mg Oral See admin instructions Isla Pence, MD      . thiamine (VITAMIN B-1) tablet 100 mg  100 mg Oral Daily Isla Pence, MD   100 mg at 05/10/16 2045   Or  . thiamine (B-1) injection 100 mg  100 mg Intravenous Daily Isla Pence, MD       Current Outpatient Prescriptions  Medication Sig Dispense Refill  . amLODipine (NORVASC) 5 MG tablet Take 1 tablet (5 mg total) by mouth daily. 30 tablet 0  . atorvastatin (LIPITOR) 20 MG tablet Take 1 tablet (20 mg total) by mouth daily at 6 PM. 30 tablet 0  . diphenhydrAMINE (BENADRYL) 25 MG tablet Take 25 mg by mouth every 6 (six) hours as needed for itching.    . hydrOXYzine (ATARAX/VISTARIL) 25 MG tablet Take 1 tablet (25 mg total) by mouth every 6 (six) hours as needed for anxiety. 30 tablet 0  .  QUEtiapine (SEROQUEL) 100 MG tablet Take 1 tablet (100 mg total) by mouth at bedtime. (Patient taking differently: Take 100-200 mg by mouth See admin instructions. Pt takes 162m twice daily and 2067mat bedtime) 30 tablet 0  . QUEtiapine (SEROQUEL) 50 MG tablet Take 1 tablet (50 mg total) by mouth 2 (two) times daily. (Patient not taking: Reported on 05/05/2016) 60 tablet 0    Musculoskeletal: Strength & Muscle Tone: within normal limits Gait & Station: normal Patient leans: N/A  Psychiatric Specialty Exam: Physical Exam  Constitutional: He is oriented to person, place, and time. He appears well-developed and well-nourished.  HENT:  Head: Normocephalic.  Neck: Normal range of motion.  Respiratory: Effort normal.  Musculoskeletal: Normal range of motion.  Neurological: He is alert and oriented to person, place, and time.  Skin: Skin is warm and dry.  Psychiatric: He has a normal mood and affect. His speech is normal and behavior is normal. Judgment and thought content normal. Cognition and memory are normal.    Review of Systems  Constitutional:  Negative.   HENT: Negative.   Eyes: Negative.   Respiratory: Negative.   Cardiovascular: Negative.   Gastrointestinal: Negative.   Genitourinary: Negative.   Musculoskeletal: Negative.   Skin: Negative.   Neurological: Negative.   Endo/Heme/Allergies: Negative.   Psychiatric/Behavioral: Positive for substance abuse.    Blood pressure 110/74, pulse 97, temperature 98.5 F (36.9 C), temperature source Oral, resp. rate 16, SpO2 97 %.There is no height or weight on file to calculate BMI.  General Appearance: Casual  Eye Contact:  Good  Speech:  Normal Rate  Volume:  Normal  Mood:  Irritable  Affect:  Congruent  Thought Process:  Coherent and Descriptions of Associations: Intact  Orientation:  Full (Time, Place, and Person)  Thought Content:  WDL  Suicidal Thoughts:  No  Homicidal Thoughts:  No  Memory:  Immediate;   Good Recent;   Good Remote;   Good  Judgement:  Fair  Insight:  Fair  Psychomotor Activity:  Normal  Concentration:  Concentration: Good and Attention Span: Good  Recall:  Good  Fund of Knowledge:  Fair  Language:  Good  Akathisia:  No  Handed:  Right  AIMS (if indicated):     Assets:  Leisure Time Physical Health Resilience Social Support  ADL's:  Intact  Cognition:  WNL  Sleep:        Treatment Plan Summary: Daily contact with patient to assess and evaluate symptoms and progress in treatment, Medication management and Plan alcohol induced mood disorder:  -Crisis stabilization -Medication management: Ativan alcohol detox protocol along with Cymbalta 20 mg daily for depression and Seroquel 100 mg at bedtime for sleep and mood. -Individual and substance abuse counseling  Disposition: No evidence of imminent risk to self or others at present.    LOWaylan BogaNP 05/11/2016 10:24 AM  Patient seen face-to-face for psychiatric evaluation, chart reviewed and case discussed with the physician extender and developed treatment plan. Reviewed the information  documented and agree with the treatment plan. MoCorena PilgrimMD

## 2016-05-11 NOTE — BH Assessment (Signed)
Patient presented via EMS, complains of nausea and SI without plan. Patient waiting for assessment, verbalizes "I am fine, I am ready to go, give me my bag." Encouraged patient to continue to wait for assessment, Patient denies SI, HI, denies AVH. Patient verbalizes "I am fine now." Returned patient belongings, bus pass given.

## 2016-05-11 NOTE — ED Notes (Signed)
TTS at bedside. 

## 2016-05-11 NOTE — BHH Suicide Risk Assessment (Signed)
Suicide Risk Assessment  Discharge Assessment   [facility] Discharge Suicide Risk Assessment   Principal Problem: Alcohol abuse with alcohol-induced mood disorder [facility]) Discharge Diagnoses:  Patient Active Problem List   Diagnosis Date Noted  . Alcohol use disorder, severe, dependence ([place]) [F10.20] 04/15/2015    Priority: High  . Cocaine abuse [F14.10] 04/16/2014    Priority: High  . Alcohol abuse with alcohol-induced mood disorder ([place]) [F10.14] 09/20/2013    Priority: High  . PTSD (post-traumatic stress disorder) [F43.10] 07/06/2012    Priority: High  . Major depressive disorder, recurrent episode, severe, with psychotic behavior ([place]) [F33.3] 12/30/2015    Priority: Low  . Insomnia [G47.00]   . HTN (hypertension), benign [I10]   . Severe episode of recurrent major depressive disorder, without psychotic features ([place]) [F33.2]   . Depression, major, severe recurrence ([place]) [F33.2] 12/30/2015  . Substance induced mood disorder ([place]) [F19.94] 12/02/2015  . Mood disorder in conditions classified elsewhere [F06.30]   . MDD (major depressive disorder), recurrent severe, without psychosis ([place]) [F33.2] 10/08/2015  . Malnutrition of moderate degree [E44.0] 09/24/2015  . Alcohol withdrawal ([place]) [F10.239] 09/23/2015  . Tobacco use disorder [F17.200] 07/16/2015  . Elevated transaminase level [R74.0]   . Drug overdose, intentional ([place]) [T50.902A] 07/12/2015  . Cocaine abuse with cocaine-induced mood disorder ([place]) [F14.14] 04/11/2015  . Overdose [T50.901A] 04/10/2015  . Severe recurrent major depressive disorder with psychotic features ([place]) [F33.3]   . Severe major depression with psychotic features ([place]) [F32.3] 09/11/2014  . Alcohol-induced mood disorder ([place]) [F10.94] 09/10/2014  . Suicidal ideation [R45.851]   . Tylenol overdose [T39.1X1A]   . Polysubstance abuse [F19.10]   . Overdose of acetaminophen [T39.1X1A] 08/03/2014  . Overdose by acetaminophen [T39.1X1A] 08/03/2014  .  Thrombocytopenia ([place]) [D69.6] 04/15/2014  . Urinary tract infection, site not specified [N39.0] 04/15/2014  . Chest pain, unspecified [R07.9] 04/15/2014  . D-dimer, elevated [R79.89] 04/15/2014  . Transaminitis [R74.0] 09/24/2013  . Cocaine dependence ([place]) [F14.20] 09/20/2013  . 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 Seizure ([place]) [R56.9] 03/15/2013  . Syncope [R55] 01/02/2013  . Leukocytopenia, unspecified [D72.819] 01/02/2013  . Left kidney mass [N28.89] 12/24/2012  . Peripheral vascular disease ([place]) [I73.9] 01/14/2012  . SEIZURE DISORDER [R56.9] 10/03/2008  . LIVER FUNCTION TESTS, ABNORMAL [R94.5] 12/29/2007  . HYPERCHOLESTEROLEMIA [E78.00] 03/21/2007  . Essential hypertension [I10] 03/21/2007    Total Time spent with patient: 45 minutes  Musculoskeletal: Strength & Muscle Tone: within normal limits Gait & Station: normal Patient leans: N/A  Psychiatric Specialty Exam: Physical Exam  Constitutional: He is oriented to person, place, and time. He appears well-developed and well-nourished.  HENT:  Head: Normocephalic.  Neck: Normal range of motion.  Respiratory: Effort normal.  Musculoskeletal: Normal range of motion.  Neurological: He is alert and oriented to person, place, and time.  Skin: Skin is warm and dry.  Psychiatric: He has a normal mood and affect. His speech is normal and behavior is normal. Judgment and thought content normal. Cognition and memory are normal.    Review of Systems  Constitutional: Negative.   HENT: Negative.   Eyes: Negative.   Respiratory: Negative.   Cardiovascular: Negative.   Gastrointestinal: Negative.   Genitourinary: Negative.   Musculoskeletal: Negative.   Skin: Negative.   Neurological: Negative.   Endo/Heme/Allergies: Negative.   Psychiatric/Behavioral: Positive for substance abuse.    Blood pressure 110/74, pulse 97, temperature 98.5 F (36.9 C), temperature source Oral, resp. rate 16, SpO2 97 %.There is no height or weight  on file  to calculate BMI.  General Appearance: Casual  Eye Contact:  Good  Speech:  Normal Rate  Volume:  Normal  Mood:  Irritable  Affect:  Congruent  Thought Process:  Coherent and Descriptions of Associations: Intact  Orientation:  Full (Time, Place, and Person)  Thought Content:  WDL  Suicidal Thoughts:  No  Homicidal Thoughts:  No  Memory:  Immediate;   Good Recent;   Good Remote;   Good  Judgement:  Fair  Insight:  Fair  Psychomotor Activity:  Normal  Concentration:  Concentration: Good and Attention Span: Good  Recall:  Good  Fund of Knowledge:  Fair  Language:  Good  Akathisia:  No  Handed:  Right  AIMS (if indicated):     Assets:  Leisure Time Physical Health Resilience Social Support  ADL's:  Intact  Cognition:  WNL  Sleep:       Mental Status Per Nursing Assessment::   On Admission:   alcohol intoxication  Demographic Factors:  Male and Abner Greenspan, lesbian, or bisexual orientation  Loss Factors: NA  Historical Factors: NA  Risk Reduction Factors:   Sense of responsibility to family, Living with another person, especially a relative and Positive social support  Continued Clinical Symptoms:  None   Cognitive Features That Contribute To Risk:  None    Suicide Risk:  Minimal: No identifiable suicidal ideation.  Patients presenting with no risk factors but with morbid ruminations; may be classified as minimal risk based on the severity of the depressive symptoms    Plan Of Care/Follow-up recommendations:  Activity:  as tolerated Diet:  heart healthy diet  Frederik Standley, NP 05/11/2016, 12:26 PM

## 2016-05-11 NOTE — ED Notes (Signed)
TSS called 8:00am psych eval to be done by psych provider

## 2016-07-04 IMAGING — CR DG ABDOMEN ACUTE W/ 1V CHEST
3 series · 3 of 3 positions shown · non-contrast
Comparison: 01/20/2014

CLINICAL DATA: Pain

EXAM:
ACUTE ABDOMEN SERIES (ABDOMEN 2 VIEW & CHEST 1 VIEW)

[x chest ap]
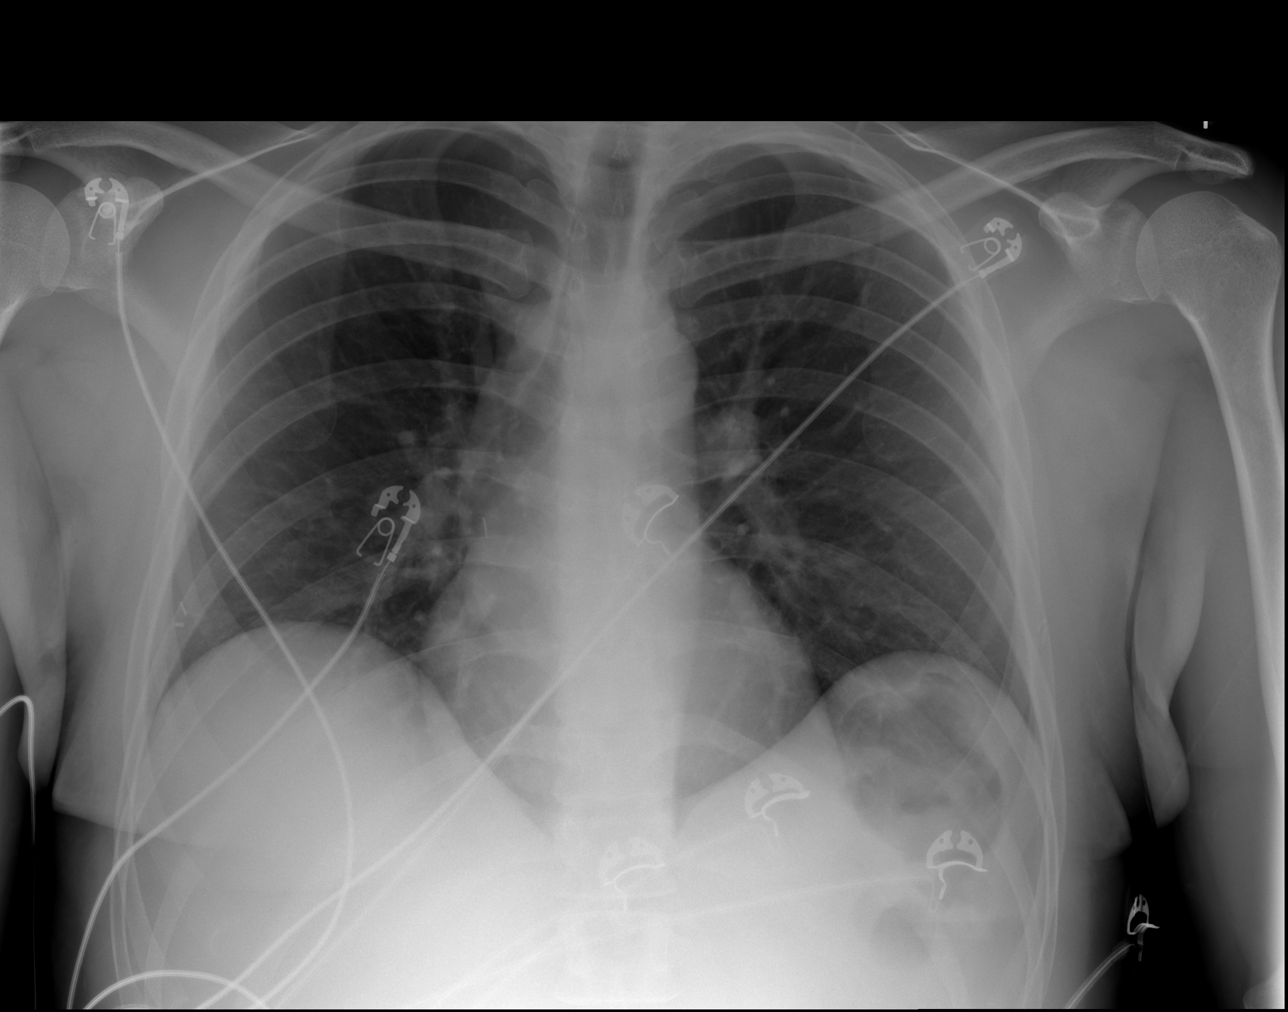

[x abdomen supine]
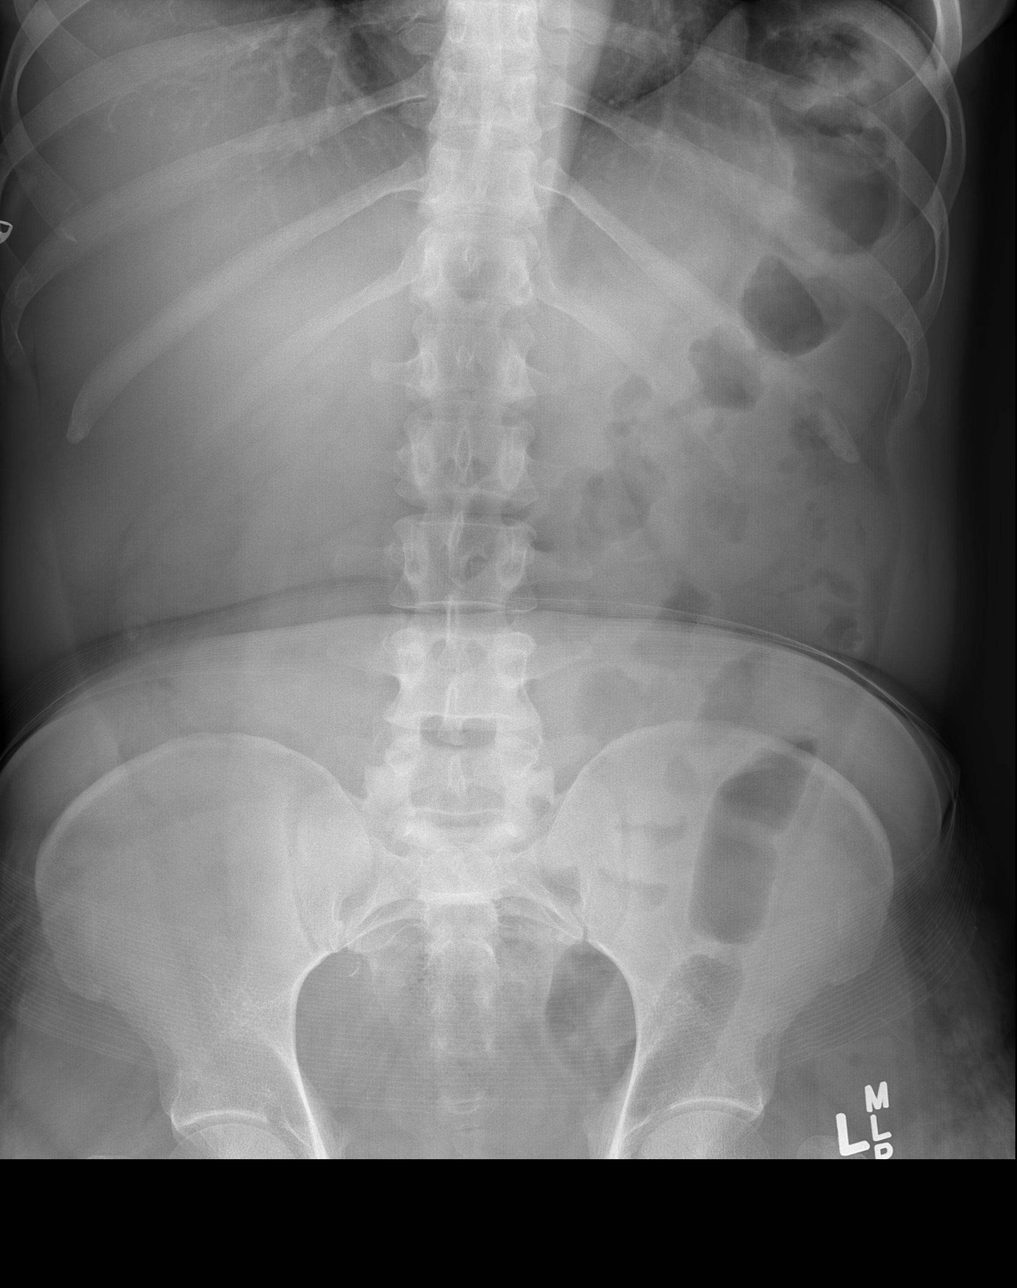

[w abdomen decub]
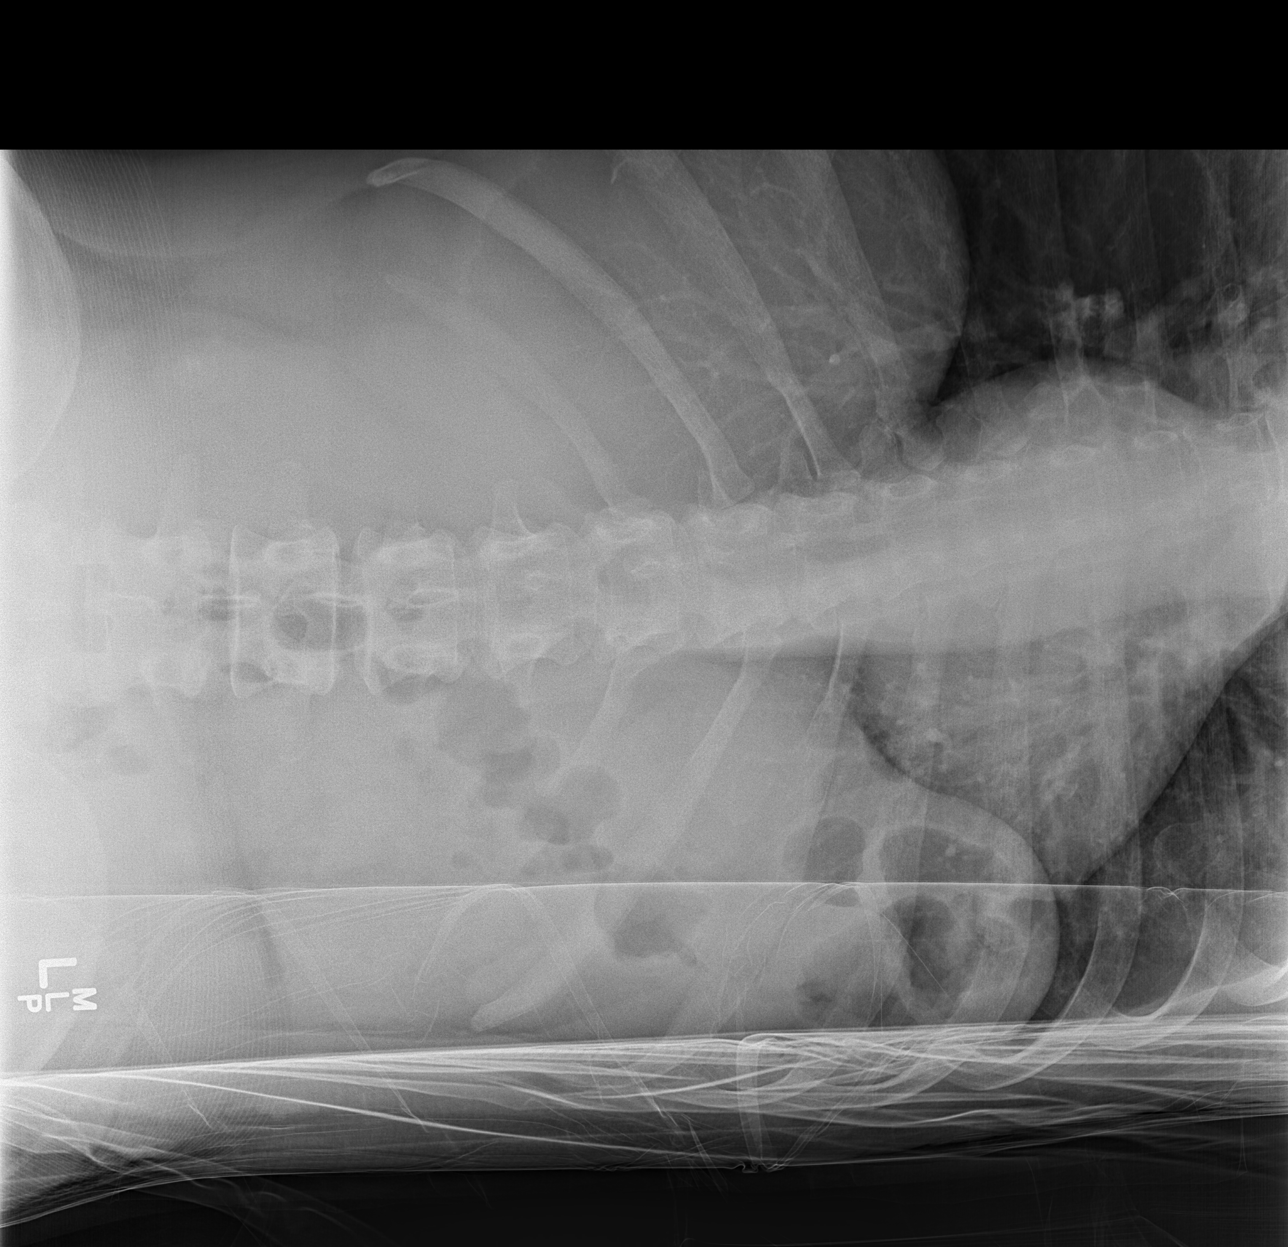

[3 of 3 positions shown; findings below may reference images not displayed]

FINDINGS: There is no evidence of dilated bowel loops or free intraperitoneal
air. No radiopaque calculi or other significant radiographic
abnormality is seen. Heart size and mediastinal contours are within
normal limits. Both lungs are clear.
IMPRESSION: No acute abnormality noted.

## 2016-07-04 IMAGING — CT CT HEAD W/O CM
2 series · 16 of 30 positions shown, 20 images · non-contrast
Comparison: CT scan of August 02, 2013.

CLINICAL DATA: Headache.

EXAM:
CT HEAD WITHOUT CONTRAST
TECHNIQUE: Contiguous axial images were obtained from the base of the skull
through the vertex without intravenous contrast.

[Series 2: head w/o · axial · non-contrast · 0.43mm/px · z∈[+1262,+1382]mm · 13 of 29 slices shown, 17 images]
[im 3/29  brain]
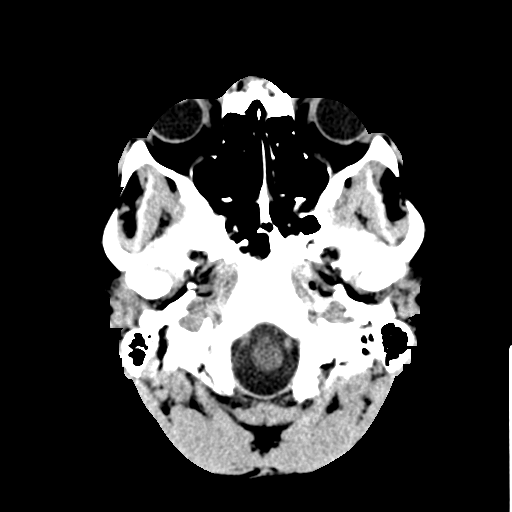
[im 3/29  bone]
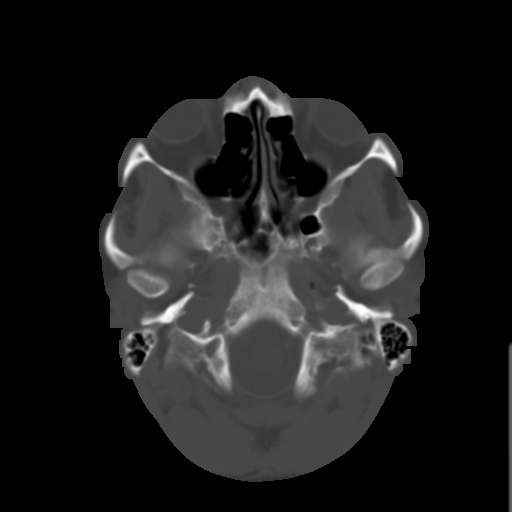
[im 5/29  brain]
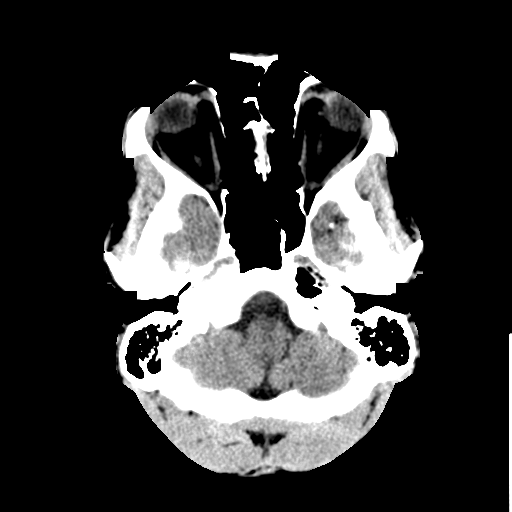
[im 7/29  brain]
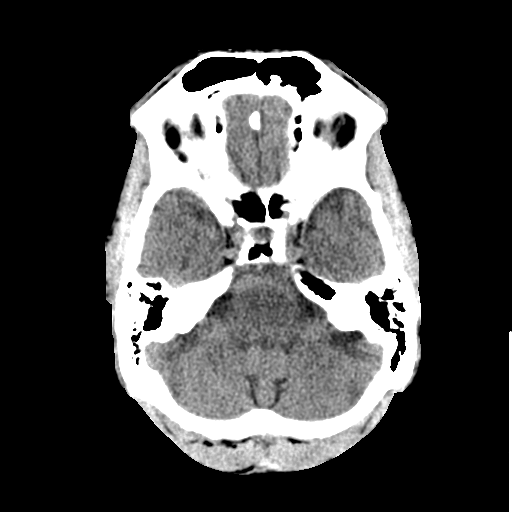
[im 9/29  brain]
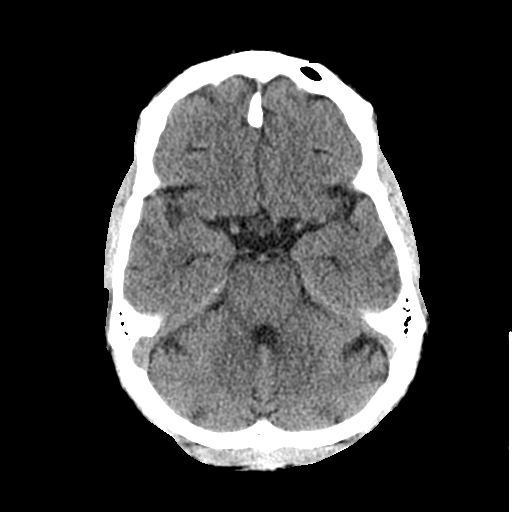
[im 11/29  brain]
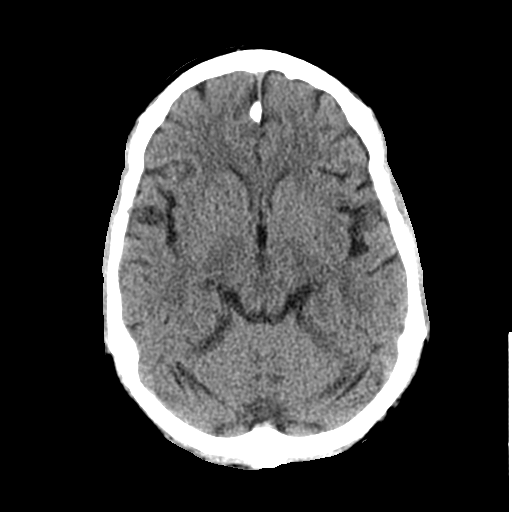
[im 11/29  bone]
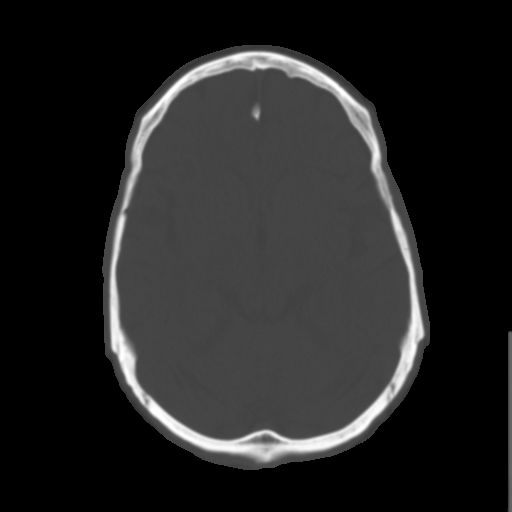
[im 13/29  brain]
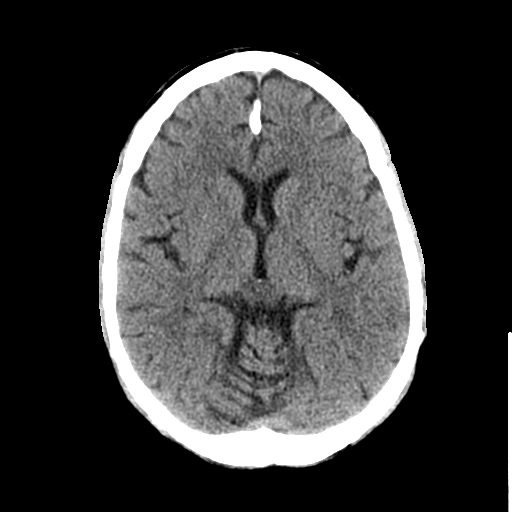
[im 15/29  brain]
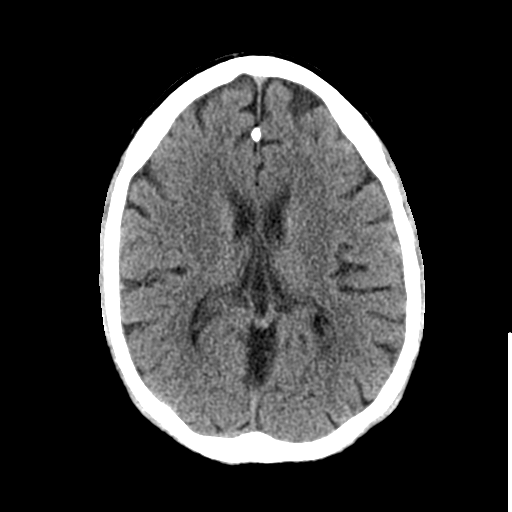
[im 17/29  brain]
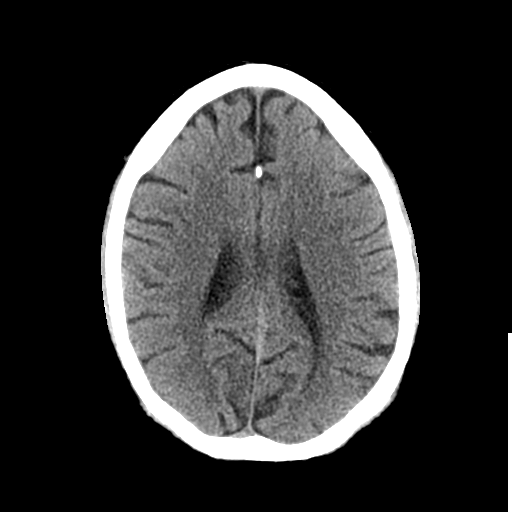
[im 19/29  brain]
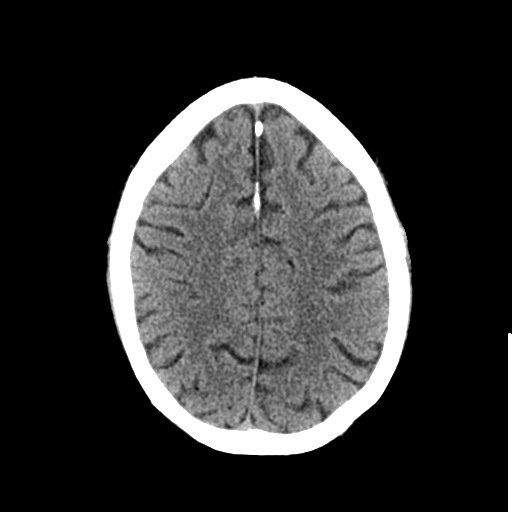
[im 19/29  bone]
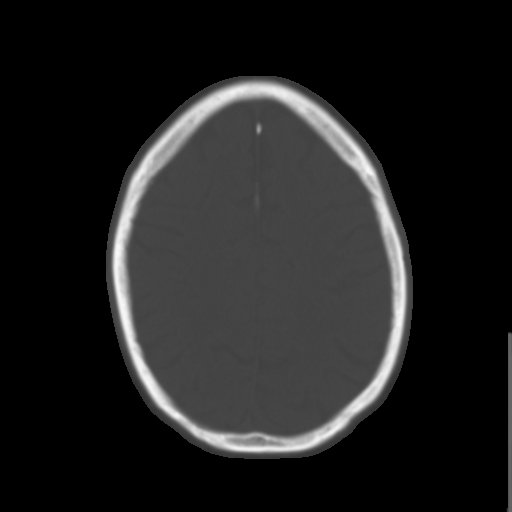
[im 21/29  brain]
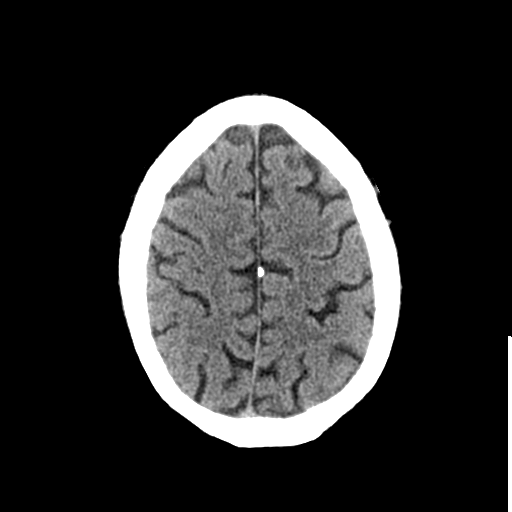
[im 23/29  brain]
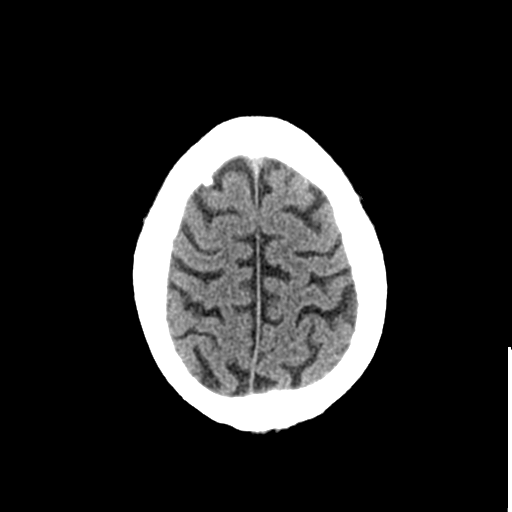
[im 25/29  brain]
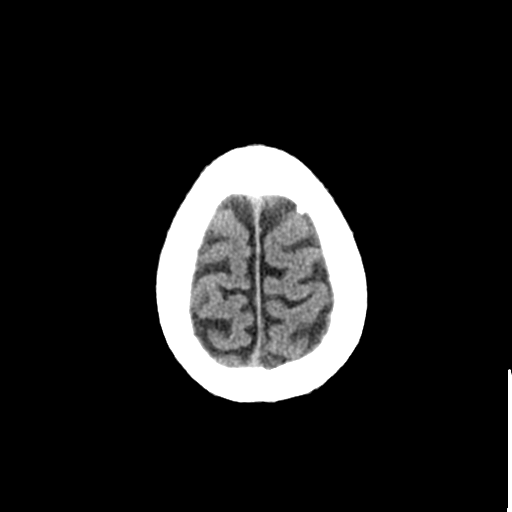
[im 27/29  brain]
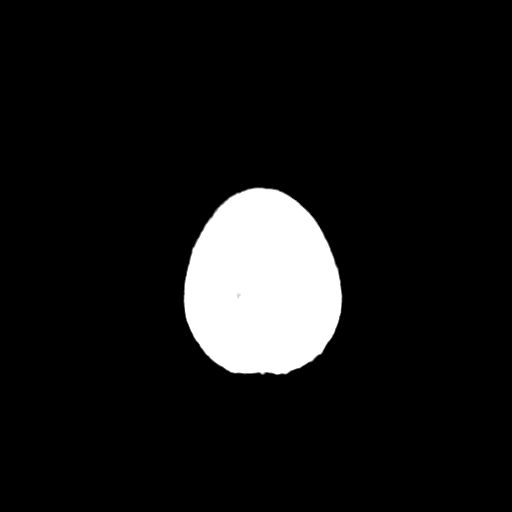
[im 27/29  bone]
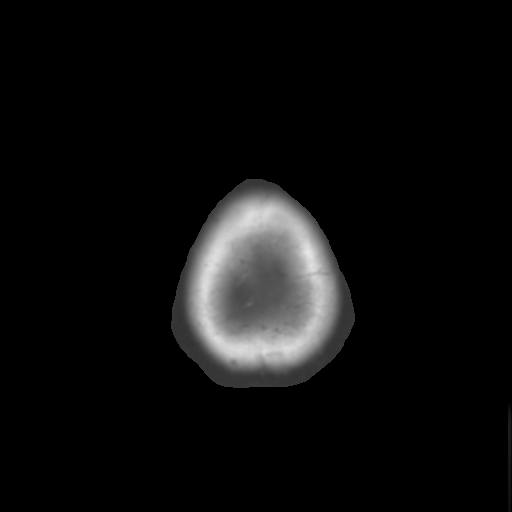

[Series 3: bone windows · axial · 0.43mm/px · z∈[+1262,+1302]mm · 3 of 29 slices shown]
[im 3/29  bone]
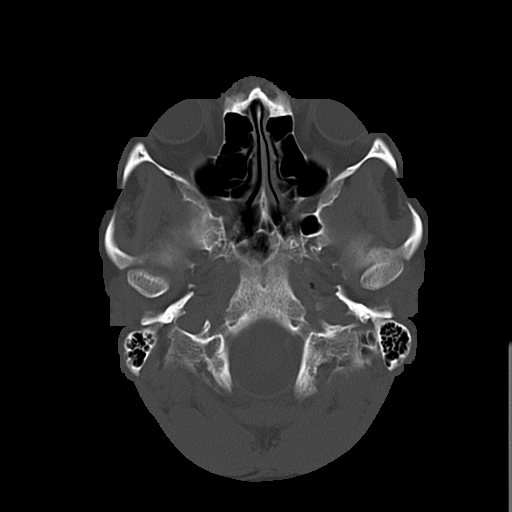
[im 7/29  bone]
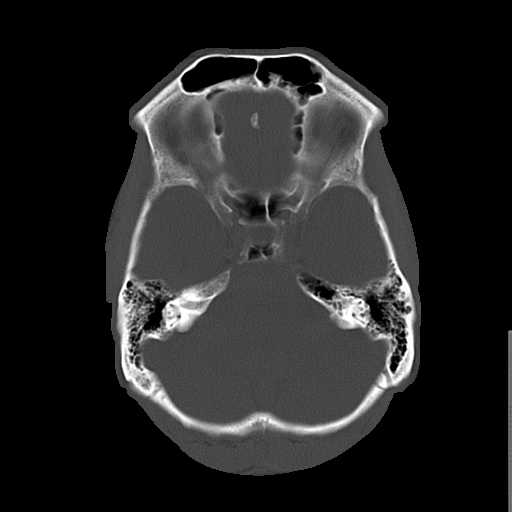
[im 11/29  bone]
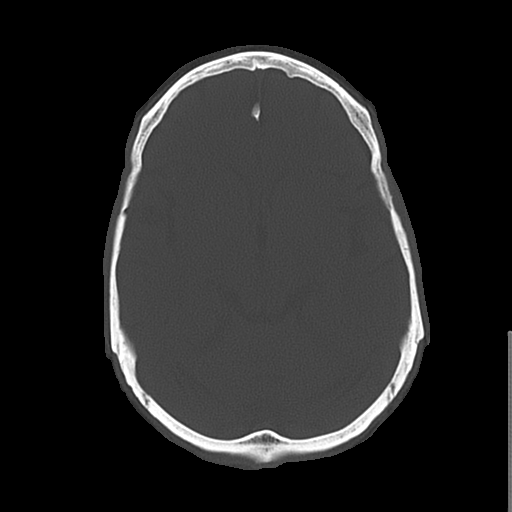

[16 of 30 positions shown; findings below may reference images not displayed]

FINDINGS: Bony calvarium appears intact. No mass effect or midline shift is
noted. Ventricular size is within normal limits. There is no
evidence of mass lesion, hemorrhage or acute infarction.
IMPRESSION: Normal head CT.

## 2016-09-16 IMAGING — CT CT ANGIO CHEST
1 of 8 series · 17 of 36 positions shown · IV contrast (Iohexol (Omnipaque 350))
Comparison: CTA chest dated 11/13/2011

CLINICAL DATA: Chest pain, elevated D-dimer, history of malignancy

EXAM:
CT ANGIOGRAPHY CHEST WITH CONTRAST
TECHNIQUE: Multidetector CT imaging of the chest was performed using the
standard protocol during bolus administration of intravenous
contrast. Multiplanar CT image reconstructions and MIPs were
obtained to evaluate the vascular anatomy.
CONTRAST:  80mL OMNIPAQUE IOHEXOL 350 MG/ML SOLN

[Series 407: thins pacs · axial · 0.68mm/px · z∈[-328,-86]mm · 17 of 274 slices shown]
[im 16/274  lung]
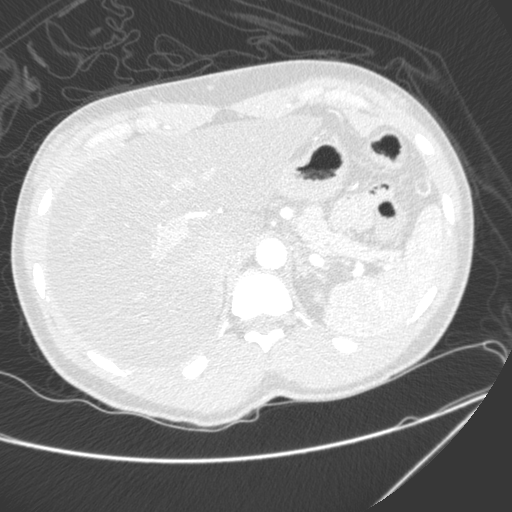
[im 31/274  mediastinal]
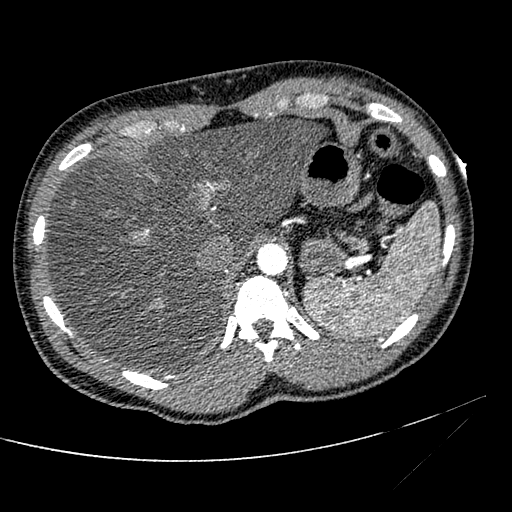
[im 46/274  lung]
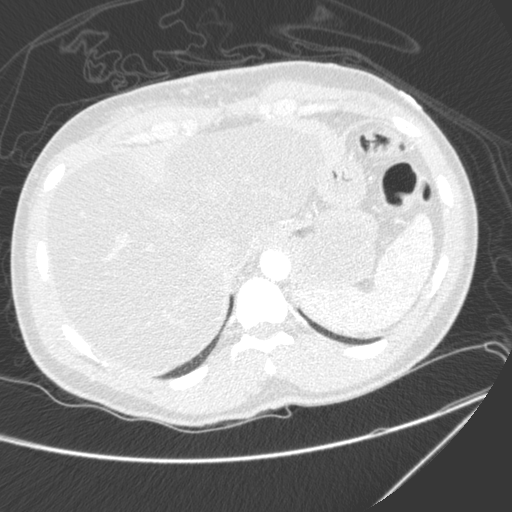
[im 61/274  mediastinal]
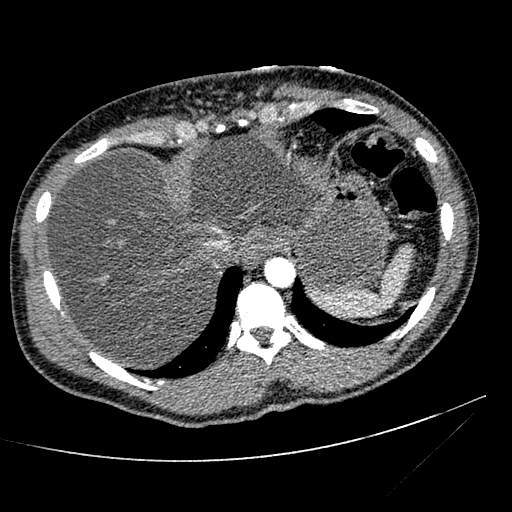
[im 76/274  lung]
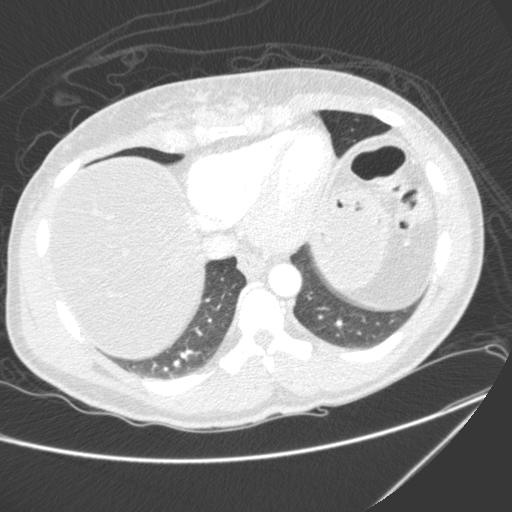
[im 92/274  mediastinal]
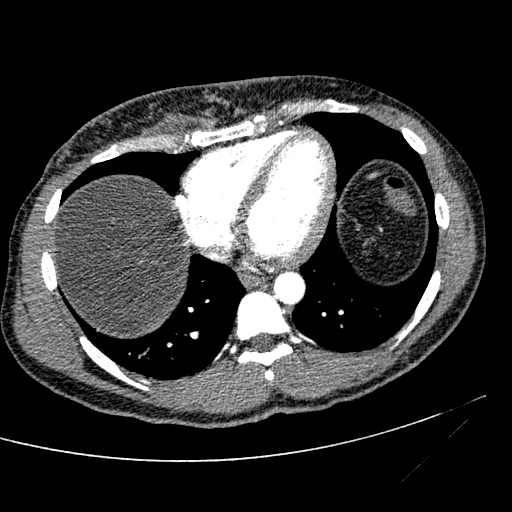
[im 107/274  lung]
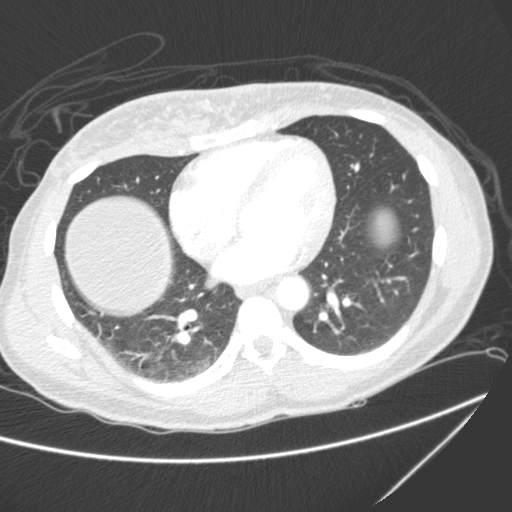
[im 122/274  mediastinal]
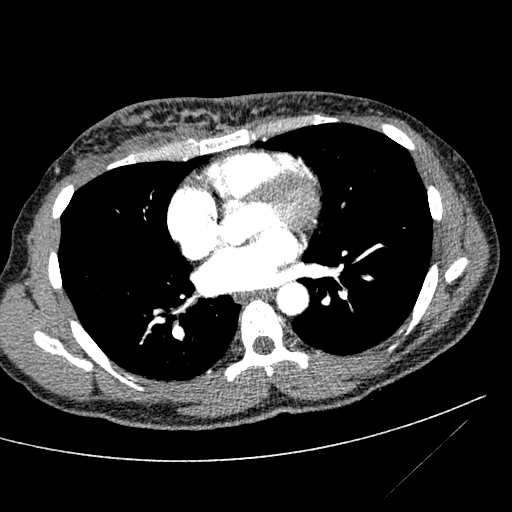
[im 137/274  lung]
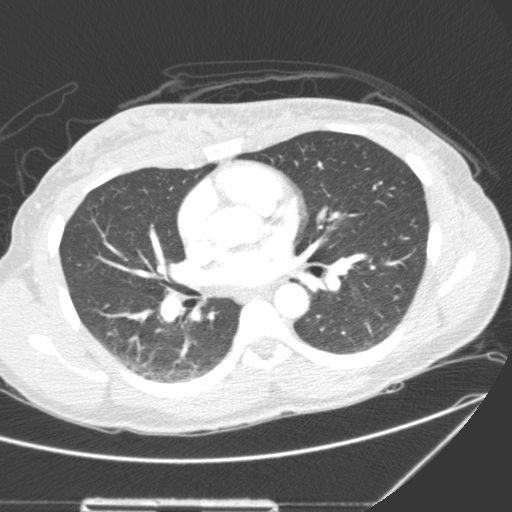
[im 152/274  mediastinal]
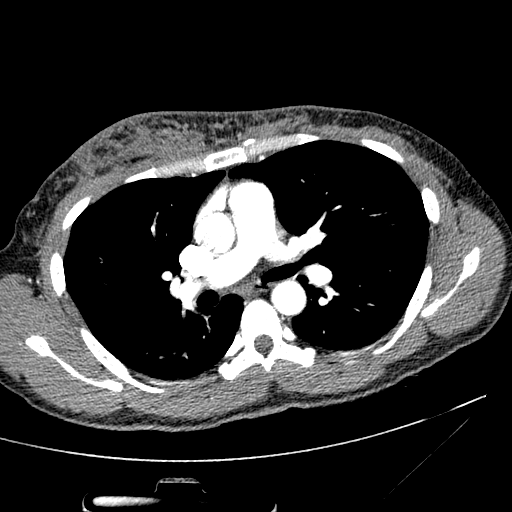
[im 167/274  lung]
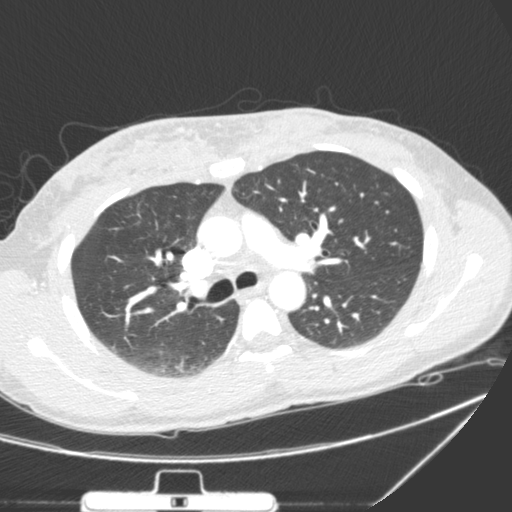
[im 183/274  mediastinal]
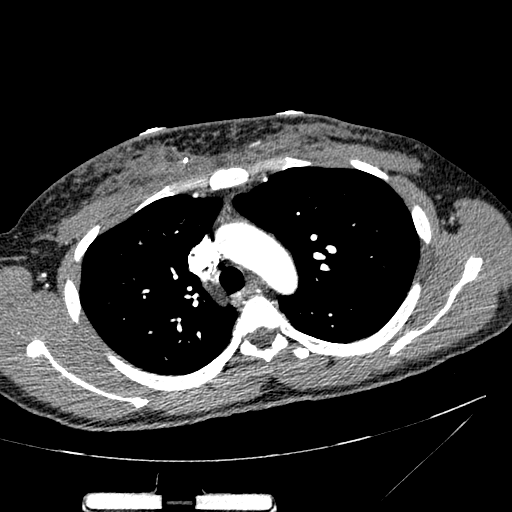
[im 198/274  lung]
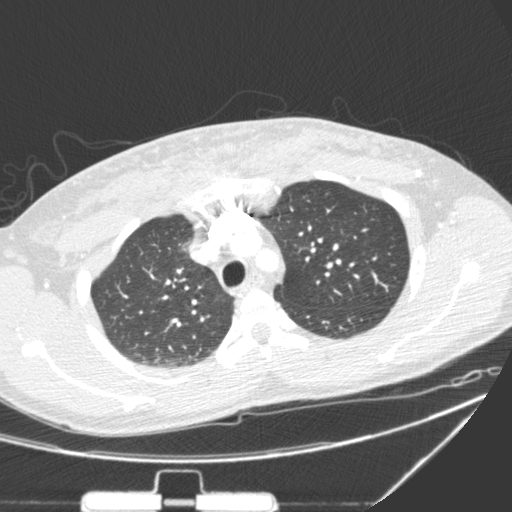
[im 213/274  mediastinal]
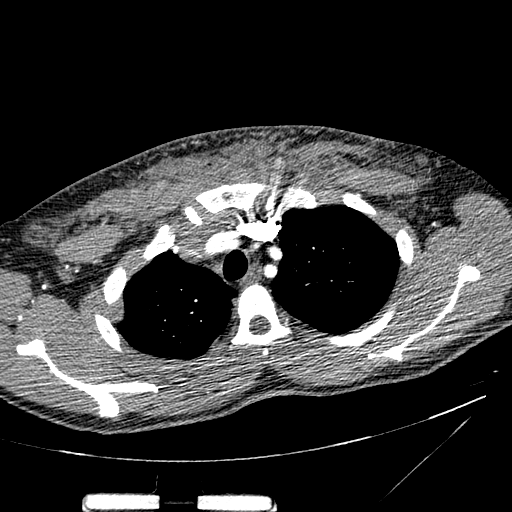
[im 228/274  lung]
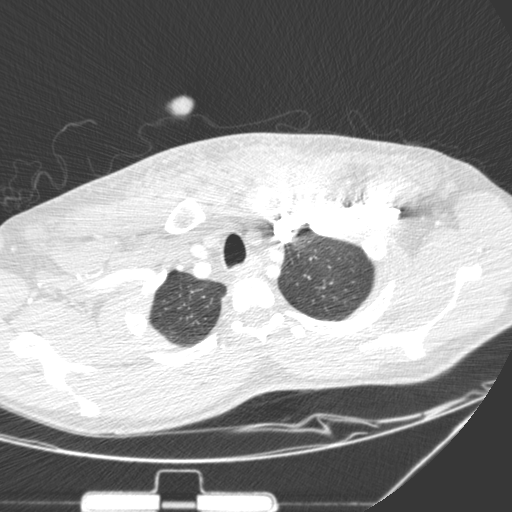
[im 243/274  mediastinal]
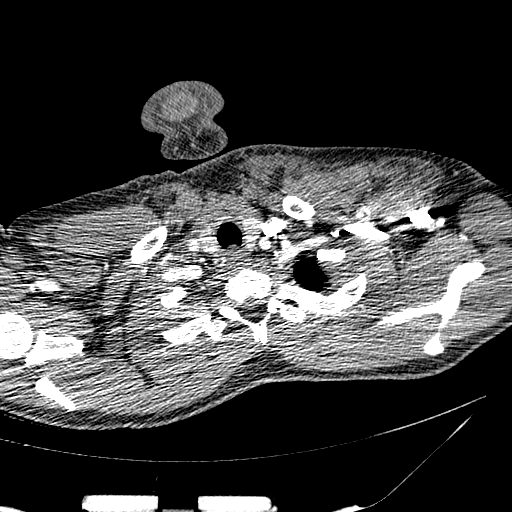
[im 258/274  lung]
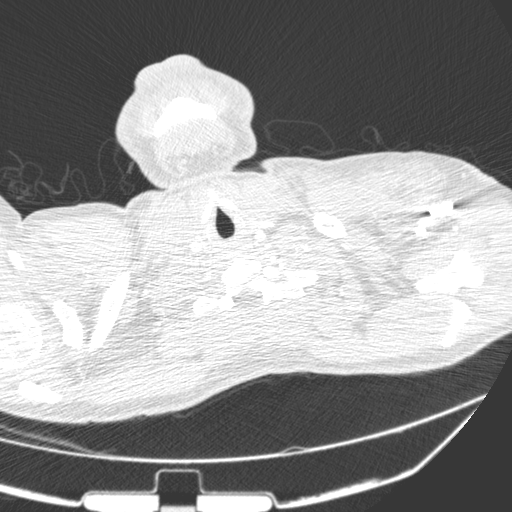

[17 of 36 positions shown; findings below may reference images not displayed]

FINDINGS: No evidence of pulmonary embolism.

Lungs are clear. No suspicious pulmonary nodules. No pleural
effusion or pneumothorax.

Visualized thyroid is unremarkable.

The heart is normal in size.  No pericardial effusion.

No suspicious mediastinal, hilar, or axillary lymphadenopathy.

Visualized upper abdomen is notable for severe hepatic steatosis.

Again noted is abnormal subcutaneous nodularity with asymmetric
thickening of the right greater than left chest wall. This
appearance is similar versus mildly progressed from 3259. At least
some of this appearance may be postsurgical given the patient's
history.

Visualized osseous structures are within normal limits.

Review of the MIP images confirms the above findings.
IMPRESSION: No evidence of pulmonary embolism.

No evidence of acute cardiopulmonary disease.

Asymmetric thickening/nodularity of the right greater than left
chest wall, similar versus mildly progressed from 3259.

## 2016-09-16 IMAGING — CR DG CHEST 2V
2 series · 2 of 2 positions shown · non-contrast
Comparison: Chest x-ray from an acute abdominal series of January 31, 2014

CLINICAL DATA: Mid chest pain with 2 days of nausea and vomiting

EXAM:
CHEST  2 VIEW

[w chest pa]
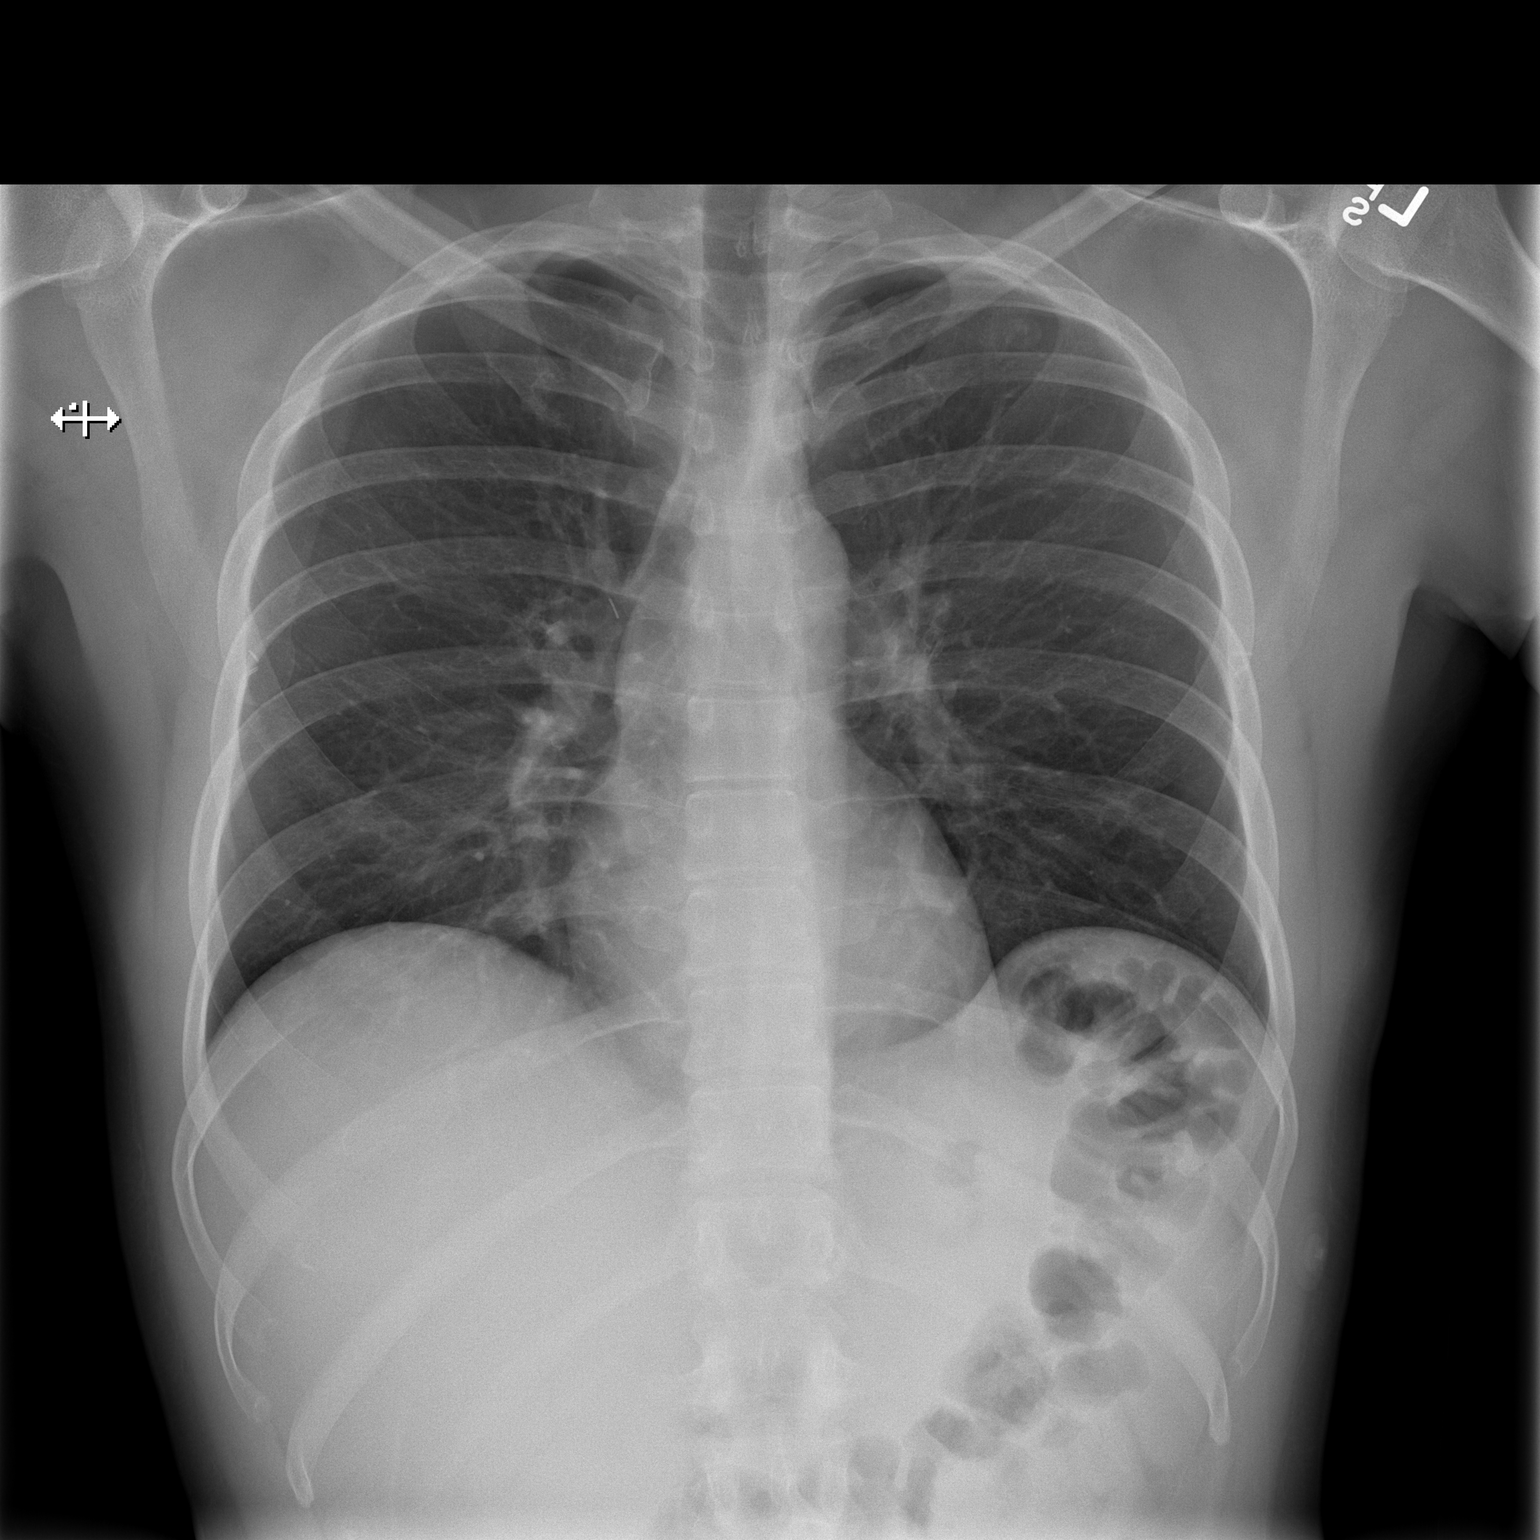

[w chest lat]
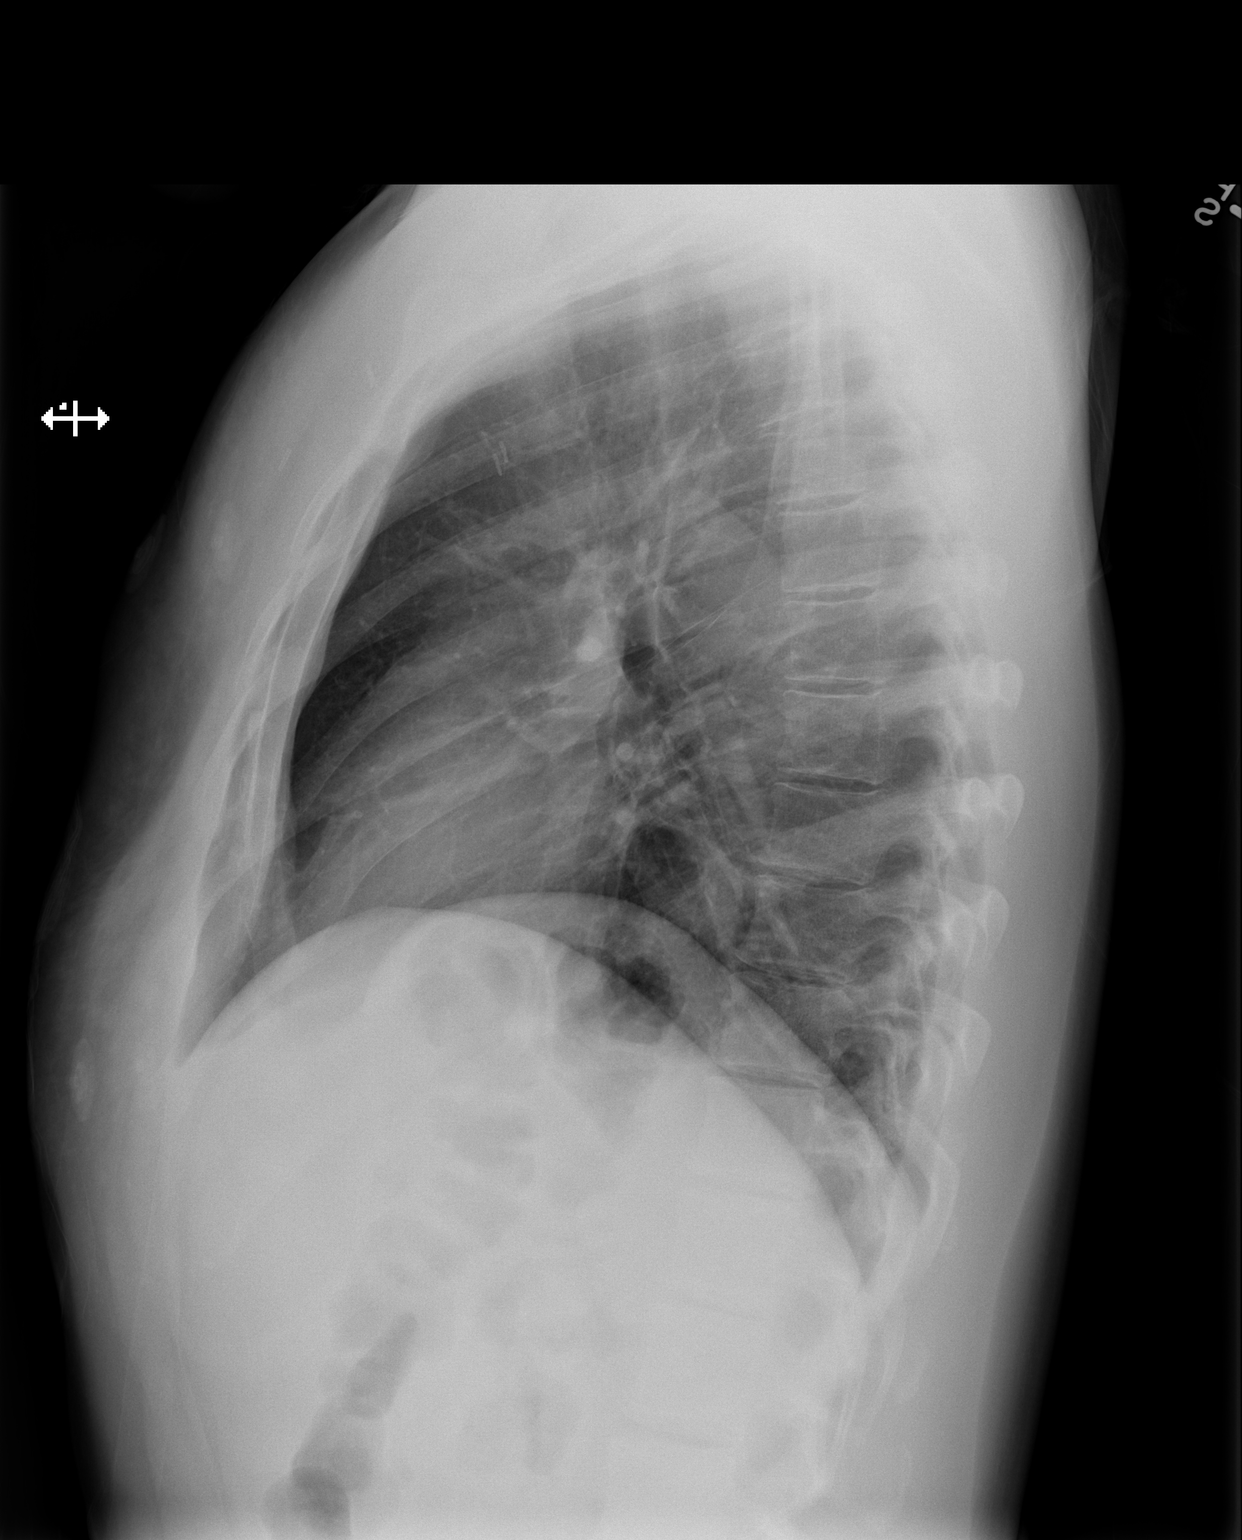

[2 of 2 positions shown; findings below may reference images not displayed]

FINDINGS: The lungs are adequately inflated. There is no focal infiltrate. The
heart and pulmonary vascularity are within the limits of normal.
There is no pleural effusion or pneumothorax. The bony thorax is
unremarkable.
IMPRESSION: There is no acute cardiopulmonary abnormality.

## 2017-01-30 IMAGING — CT CT ABD-PELV W/ CM
1 of 2 series · 15 of 32 positions shown, 19 images · IV contrast (OMNIPAQUE 300)
Comparison: 04/27/2013

CLINICAL DATA: Abdominal pain, emesis, and blood in stool for 3
days. Rectal bleeding.

EXAM:
CT ABDOMEN AND PELVIS WITH CONTRAST
TECHNIQUE: Multidetector CT imaging of the abdomen and pelvis was performed
using the standard protocol following bolus administration of
intravenous contrast.
CONTRAST:  50mL OMNIPAQUE IOHEXOL 300 MG/ML SOLN, 100mL OMNIPAQUE
IOHEXOL 300 MG/ML SOLN

[Series 2: abd/pel with · axial · 0.72mm/px · z∈[-330,+130]mm · 15 of 102 slices shown, 19 images]
[im 5/102  soft-tissue]
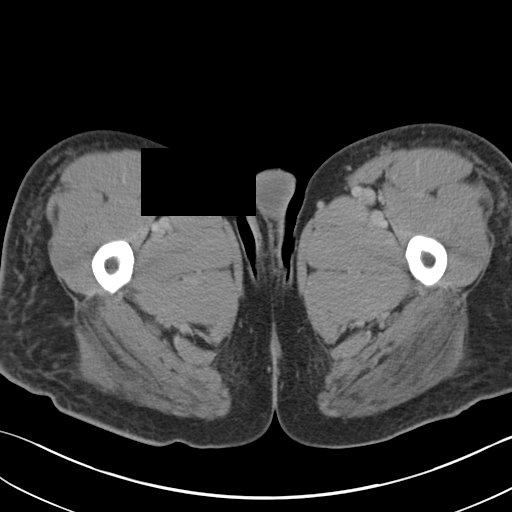
[im 5/102  bone]
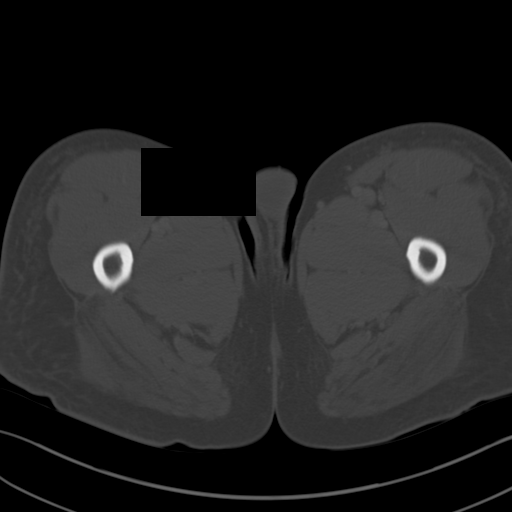
[im 14/102  soft-tissue]
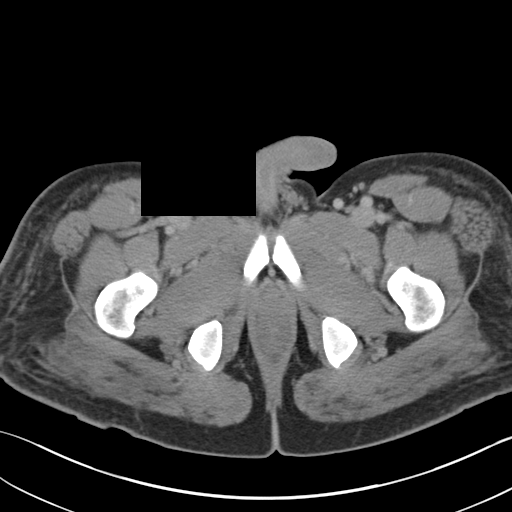
[im 22/102  soft-tissue]
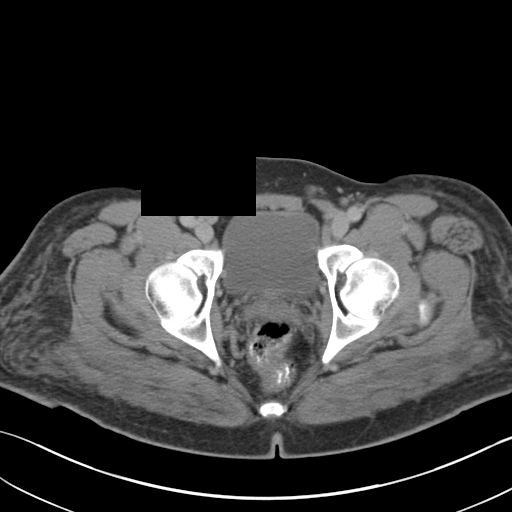
[im 27/102  soft-tissue]
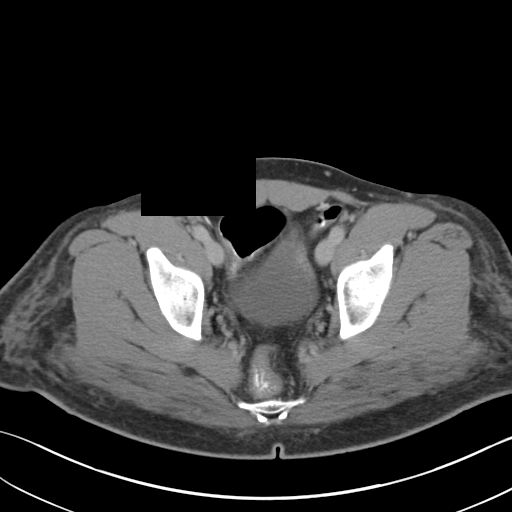
[im 36/102  soft-tissue]
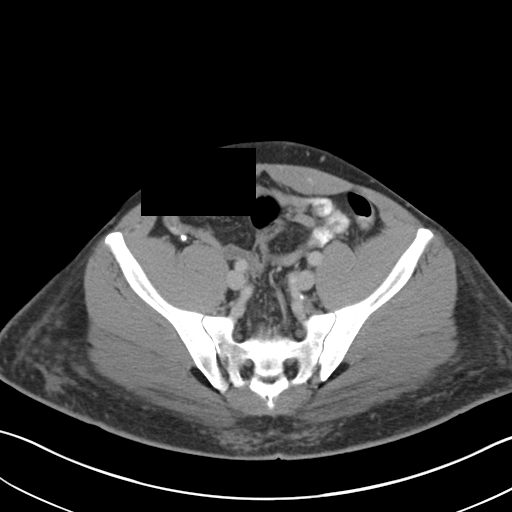
[im 44/102  soft-tissue]
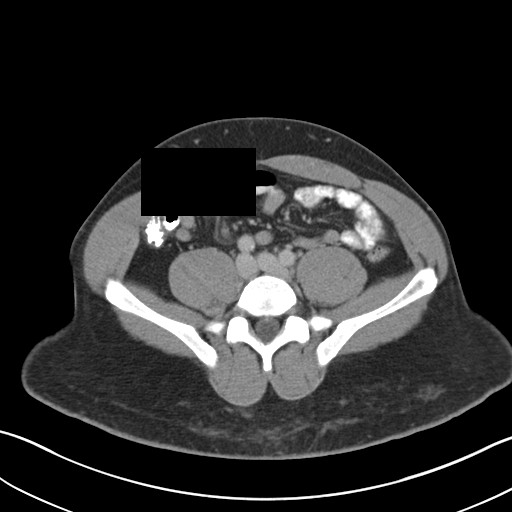
[im 53/102  soft-tissue]
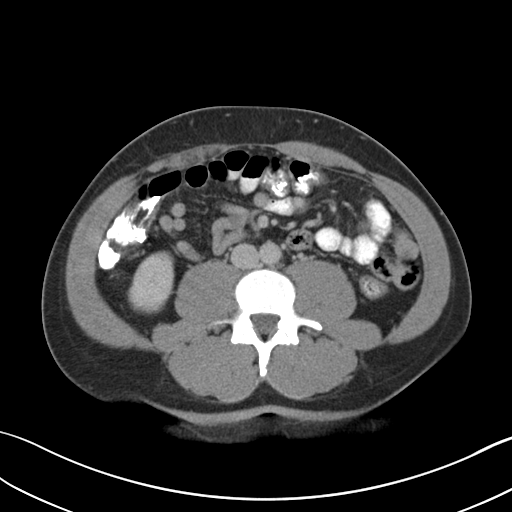
[im 58/102  soft-tissue]
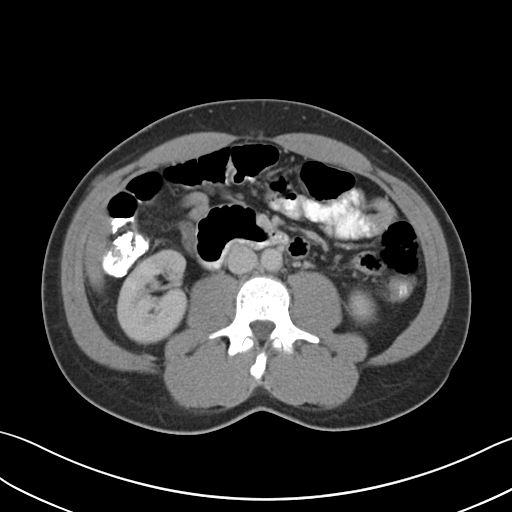
[im 66/102  soft-tissue]
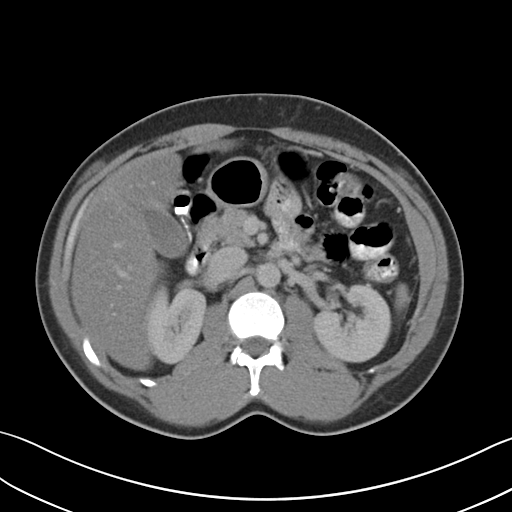
[im 66/102  bone]
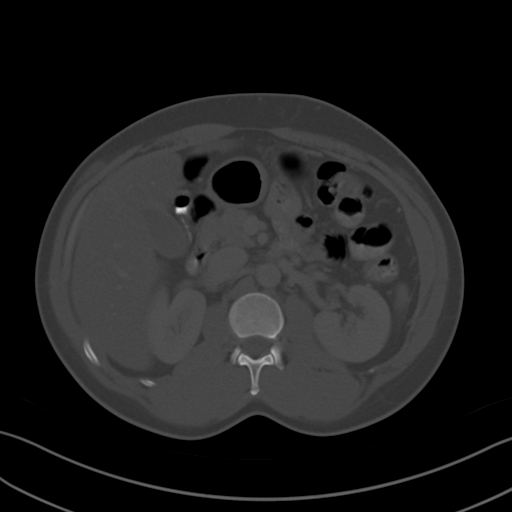
[im 75/102  soft-tissue]
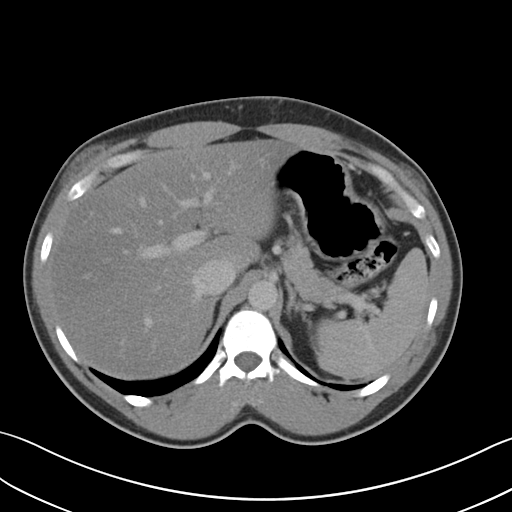
[im 80/102  soft-tissue]
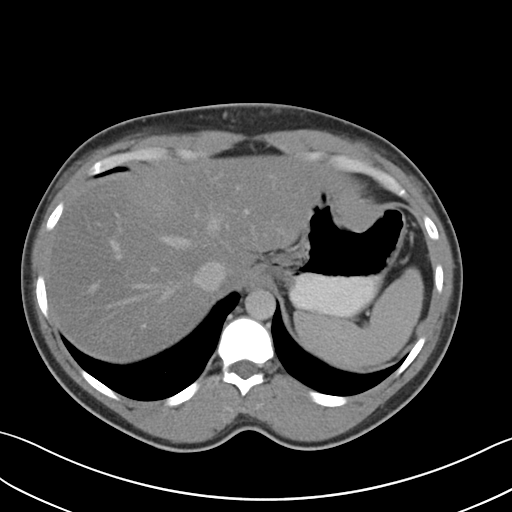
[im 84/102  lung]
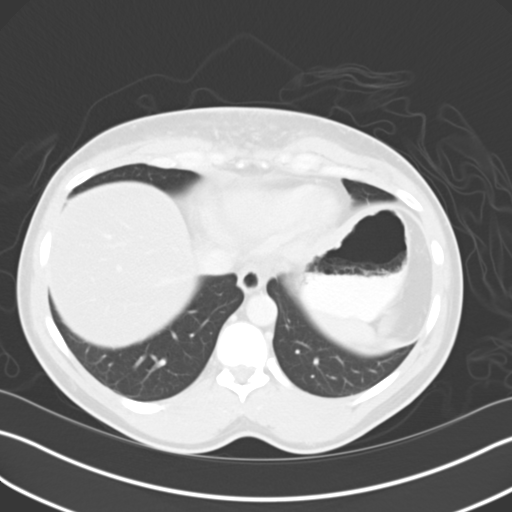
[im 88/102  soft-tissue]
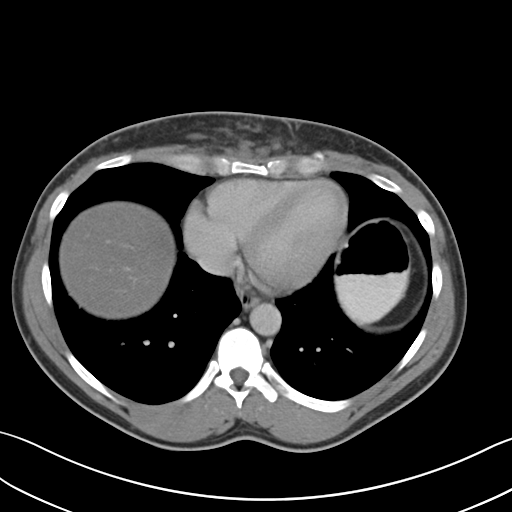
[im 88/102  lung]
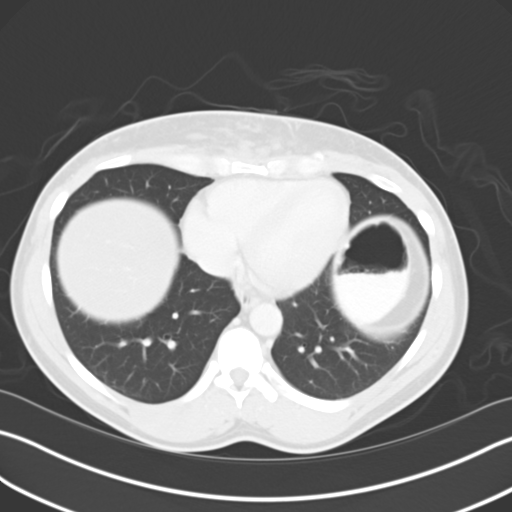
[im 93/102  lung]
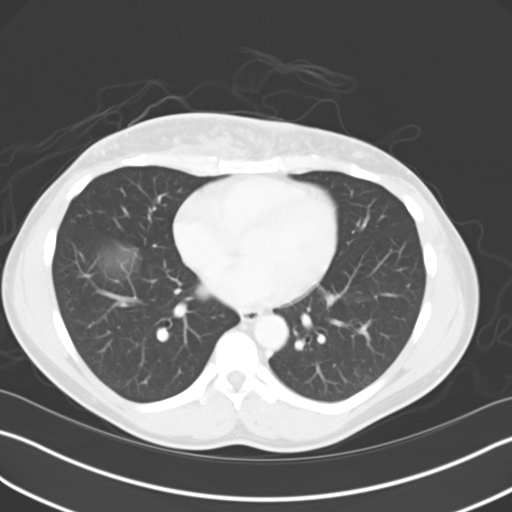
[im 97/102  soft-tissue]
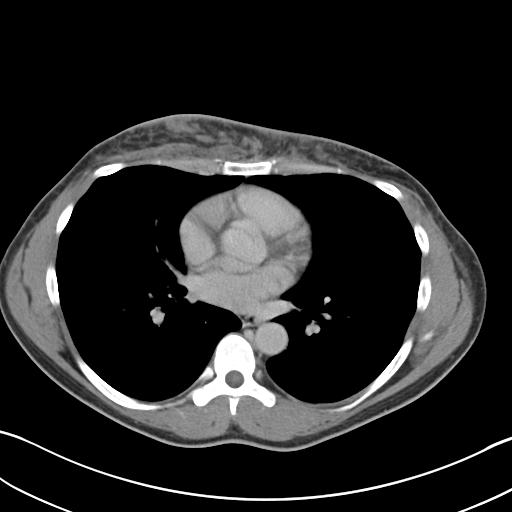
[im 97/102  lung]
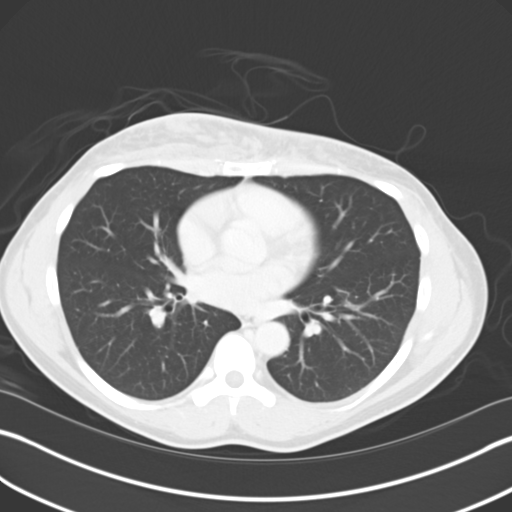

[15 of 32 positions shown; findings below may reference images not displayed]

FINDINGS: Lung bases are clear. Scarring in infiltration in the subcutaneous
fat over the right breast appears to been present previously and has
been attributed to silicone breast injections. No calcification.
Mild skin thickening.

Diffuse fatty infiltration of the liver. The gallbladder, spleen,
pancreas, adrenal glands, abdominal aorta, inferior vena cava, and
retroperitoneal lymph nodes are unremarkable. Postsurgical changes
demonstrated in the inferior pole left kidney consistent with
previous partial nephrectomy. No hydronephrosis in either kidney.
Stomach, small bowel, and colon appear normal for degree of
distention. Contrast material flows through to the colon without
evidence of bowel obstruction. Decompression of some loops limits
evaluation of the bowel wall. No free air or free fluid in the
abdomen. Abdominal wall musculature appears intact.

Pelvis: The appendix is normal. Rectosigmoid colon appears normal.
Bladder wall is not thickened. Prostate gland is not enlarged. No
free or loculated pelvic fluid collections. No pelvic mass or
lymphadenopathy. Nodular infiltration in the subcutaneous fat over
the buttocks regions similar to previous study. Query additional
silicon injections.
IMPRESSION: No focal acute process demonstrated in the abdomen or pelvis.
Diffuse fatty infiltration of the liver. No evidence of bowel
obstruction. Evaluation of bowel wall is limited due to under
distention. Nodular infiltration demonstrated the right breast and
in the gluteal regions without change since prior study and possibly
related to silicone injections.

## 2017-02-10 IMAGING — CR DG ABD PORTABLE 1V
1 series · 1 of 1 positions shown · non-contrast
Comparison: CT abdomen and pelvis 08/29/2014.  Abdomen 01/31/2014.

CLINICAL DATA: Patient was unsteady and fell earlier today, hitting
head on vehicle door. Now unresponsive. Altered mental status. Query
ingestion.

EXAM:
PORTABLE ABDOMEN - 1 VIEW

[ap (kub)]
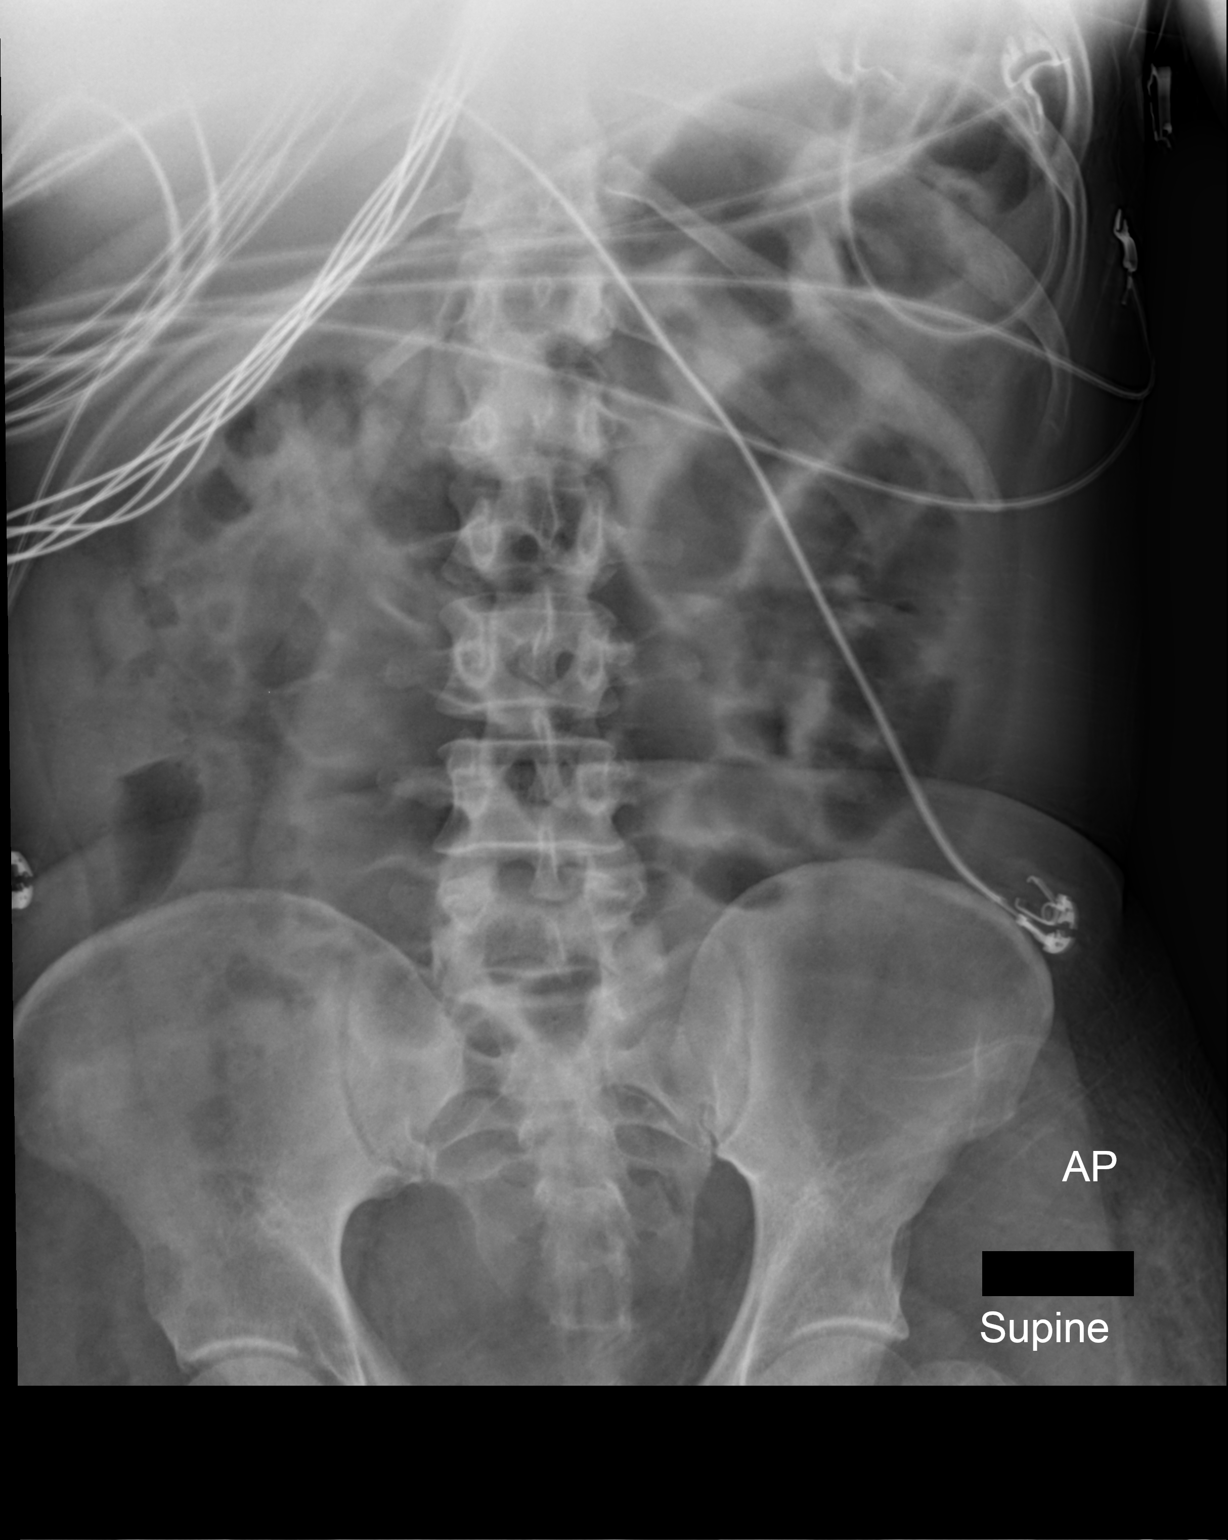

[1 of 1 positions shown; findings below may reference images not displayed]

FINDINGS: Scattered gas and stool in the colon. Scattered gas-filled small
bowel. No distinct bowel distention. Changes likely due to ileus or
enteritis. No radiopaque stones. Visualized bones appear intact.
Examination is limited due to motion artifact.
IMPRESSION: Mild prominence of gas-filled nondistended small bowel may indicate
ileus or enteritis.

## 2017-02-10 IMAGING — CT CT HEAD W/O CM
2 series · 17 of 30 positions shown, 20 images · non-contrast
Comparison: 01/31/2014

CLINICAL DATA: Altered mental status, head injury and subsequent
unresponsiveness. History of seizures, bipolar disorder,
schizophrenia, and renal cancer.

EXAM:
CT HEAD WITHOUT CONTRAST
TECHNIQUE: Contiguous axial images were obtained from the base of the skull
through the vertex without intravenous contrast.

[Series 2: head w/o · axial · non-contrast · 0.40mm/px · z∈[-108,+12]mm · 9 of 30 slices shown, 12 images]
[im 3/30  brain]
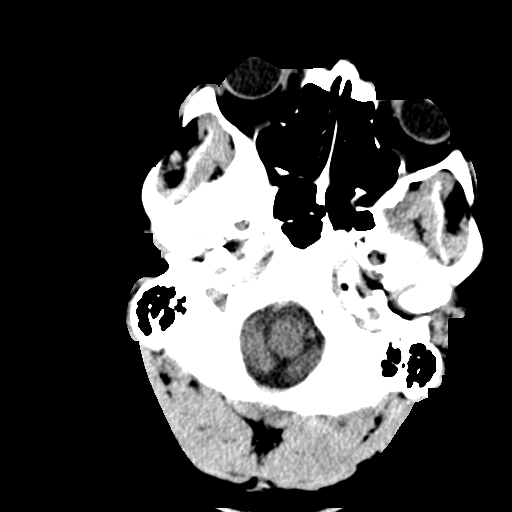
[im 3/30  bone]
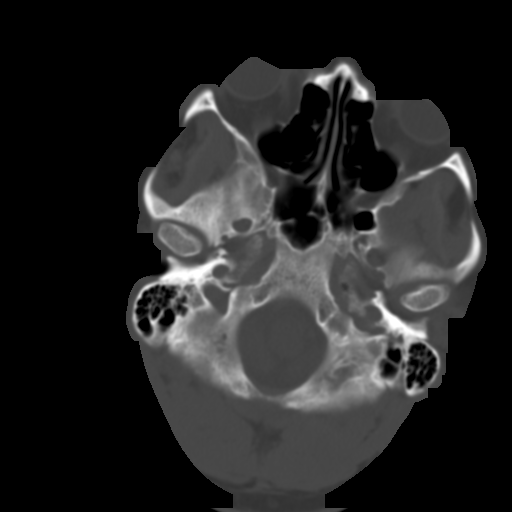
[im 6/30  brain]
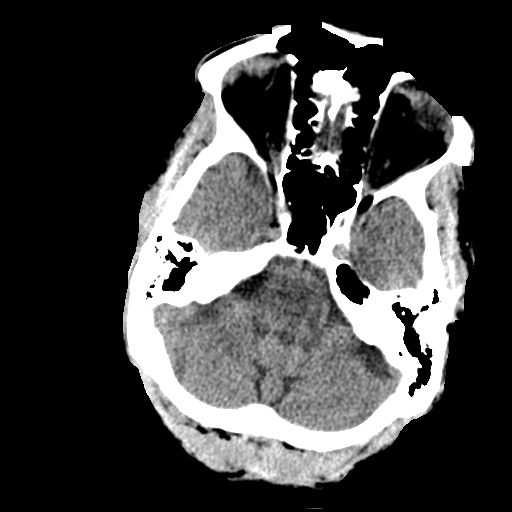
[im 9/30  brain]
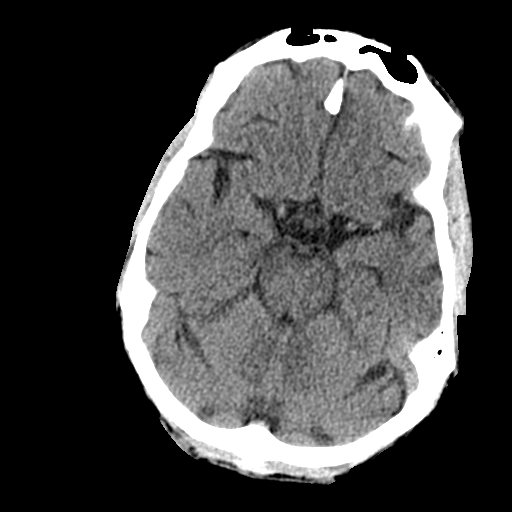
[im 12/30  brain]
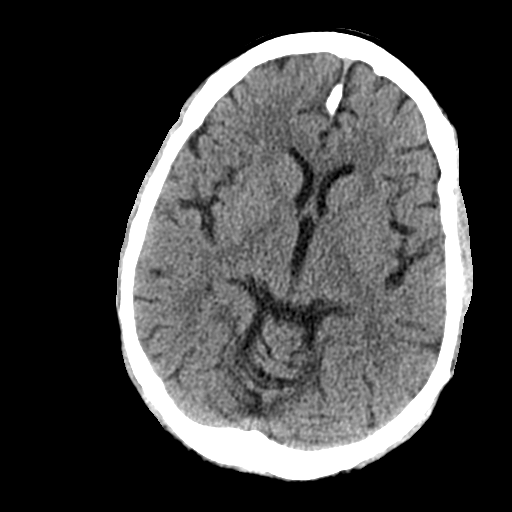
[im 15/30  brain]
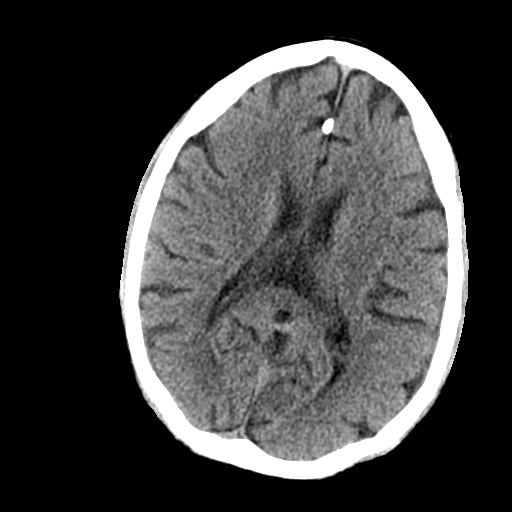
[im 15/30  bone]
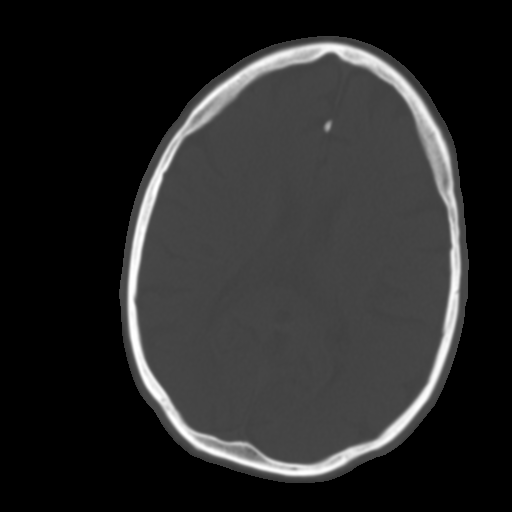
[im 18/30  brain]
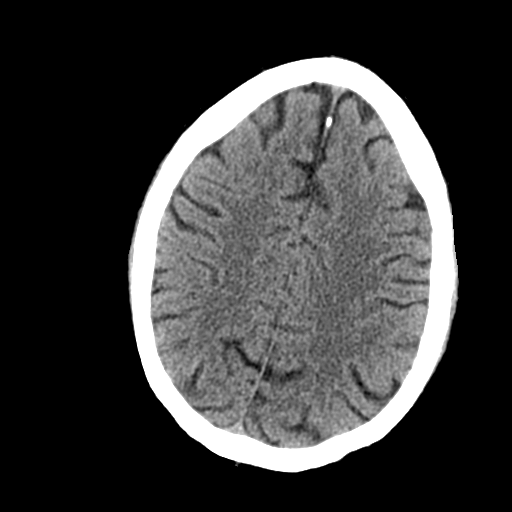
[im 21/30  brain]
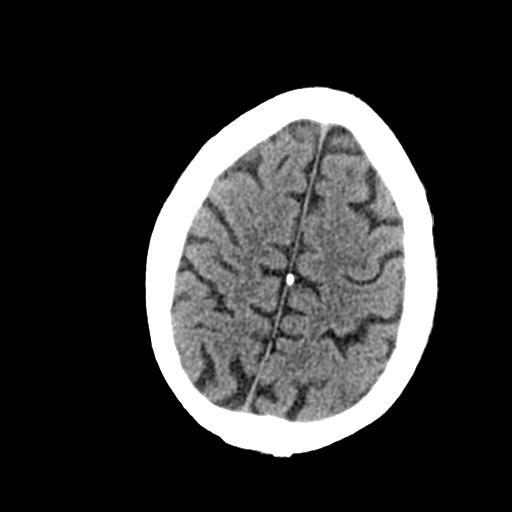
[im 24/30  brain]
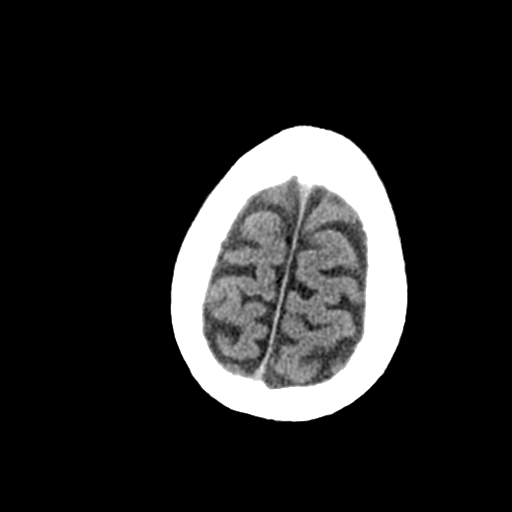
[im 27/30  brain]
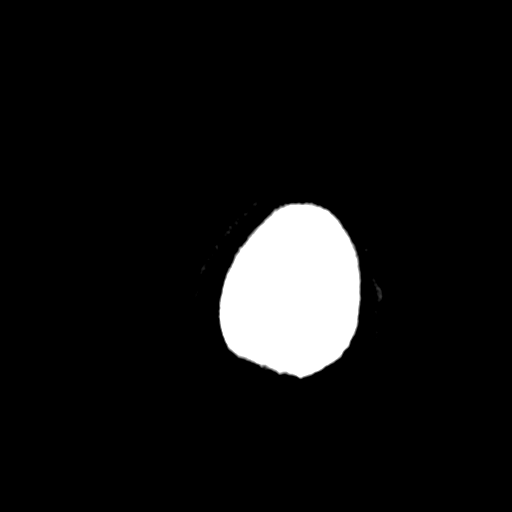
[im 27/30  bone]
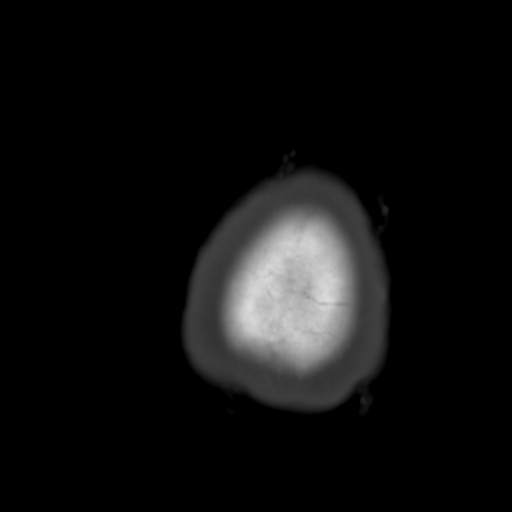

[Series 3: bone windows · axial · 0.40mm/px · z∈[-103,+8]mm · 8 of 49 slices shown]
[im 6/49  bone]
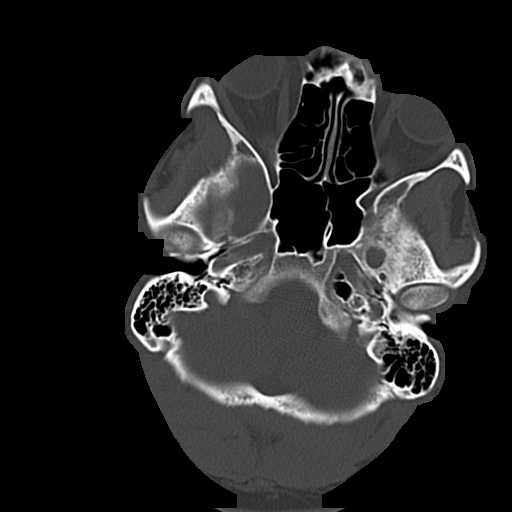
[im 11/49  bone]
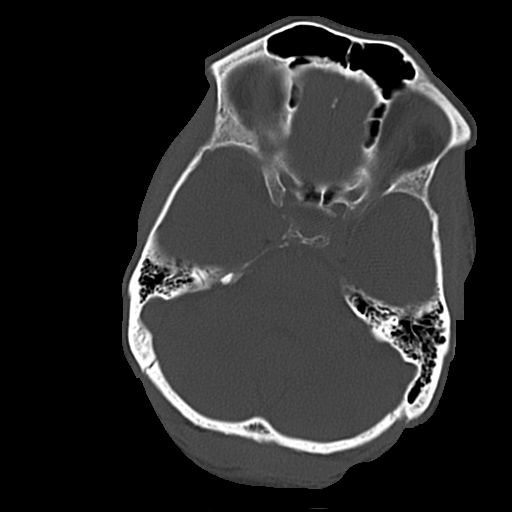
[im 17/49  bone]
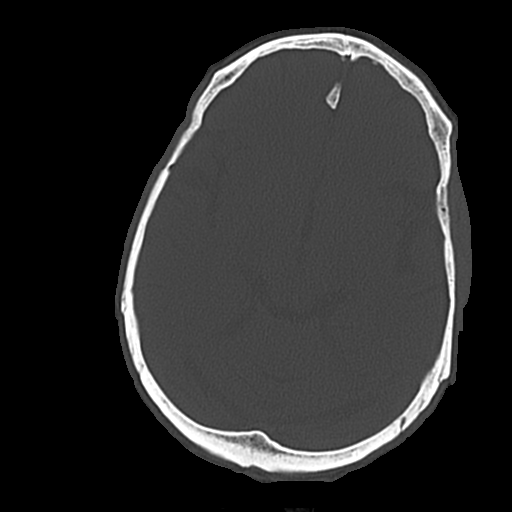
[im 22/49  bone]
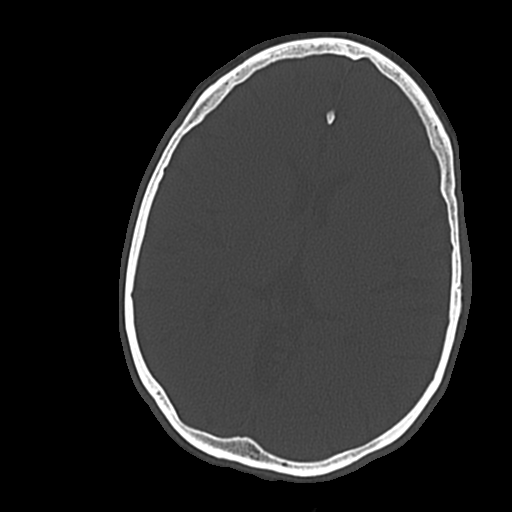
[im 27/49  bone]
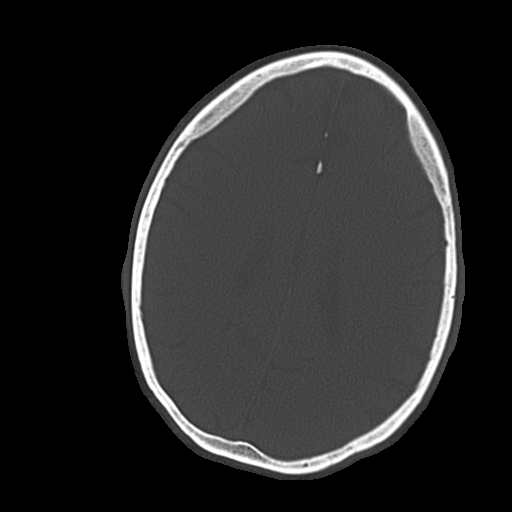
[im 33/49  bone]
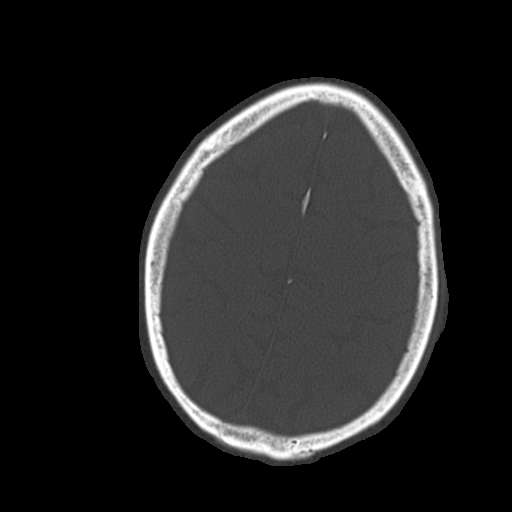
[im 38/49  bone]
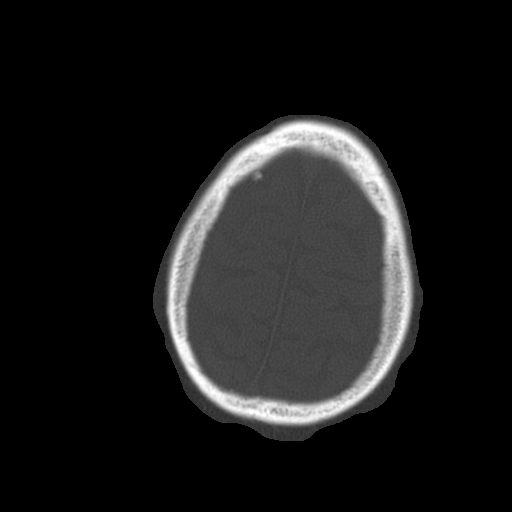
[im 43/49  bone]
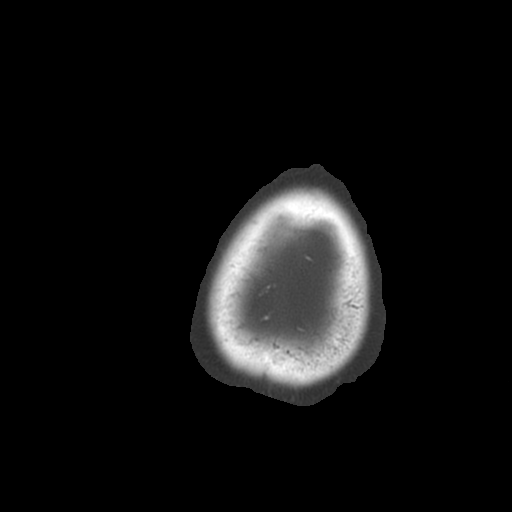

[17 of 30 positions shown; findings below may reference images not displayed]

FINDINGS: The cerebellum, brainstem, cerebral peduncle is, thalami, basal
ganglia, ventricular system, and basilar cisterns appear normal.
Several ossifications are present along the falx, as before. No
intracranial hemorrhage, mass lesion, or acute CVA. The calvarium
appears intact.
IMPRESSION: 1. No significant abnormality identified.

## 2017-02-10 IMAGING — CR DG CHEST 1V PORT
1 series · 1 of 1 positions shown · non-contrast
Comparison: 04/15/2014

CLINICAL DATA: Unresponsive with altered mental status.

EXAM:
PORTABLE CHEST - 1 VIEW

[AP]
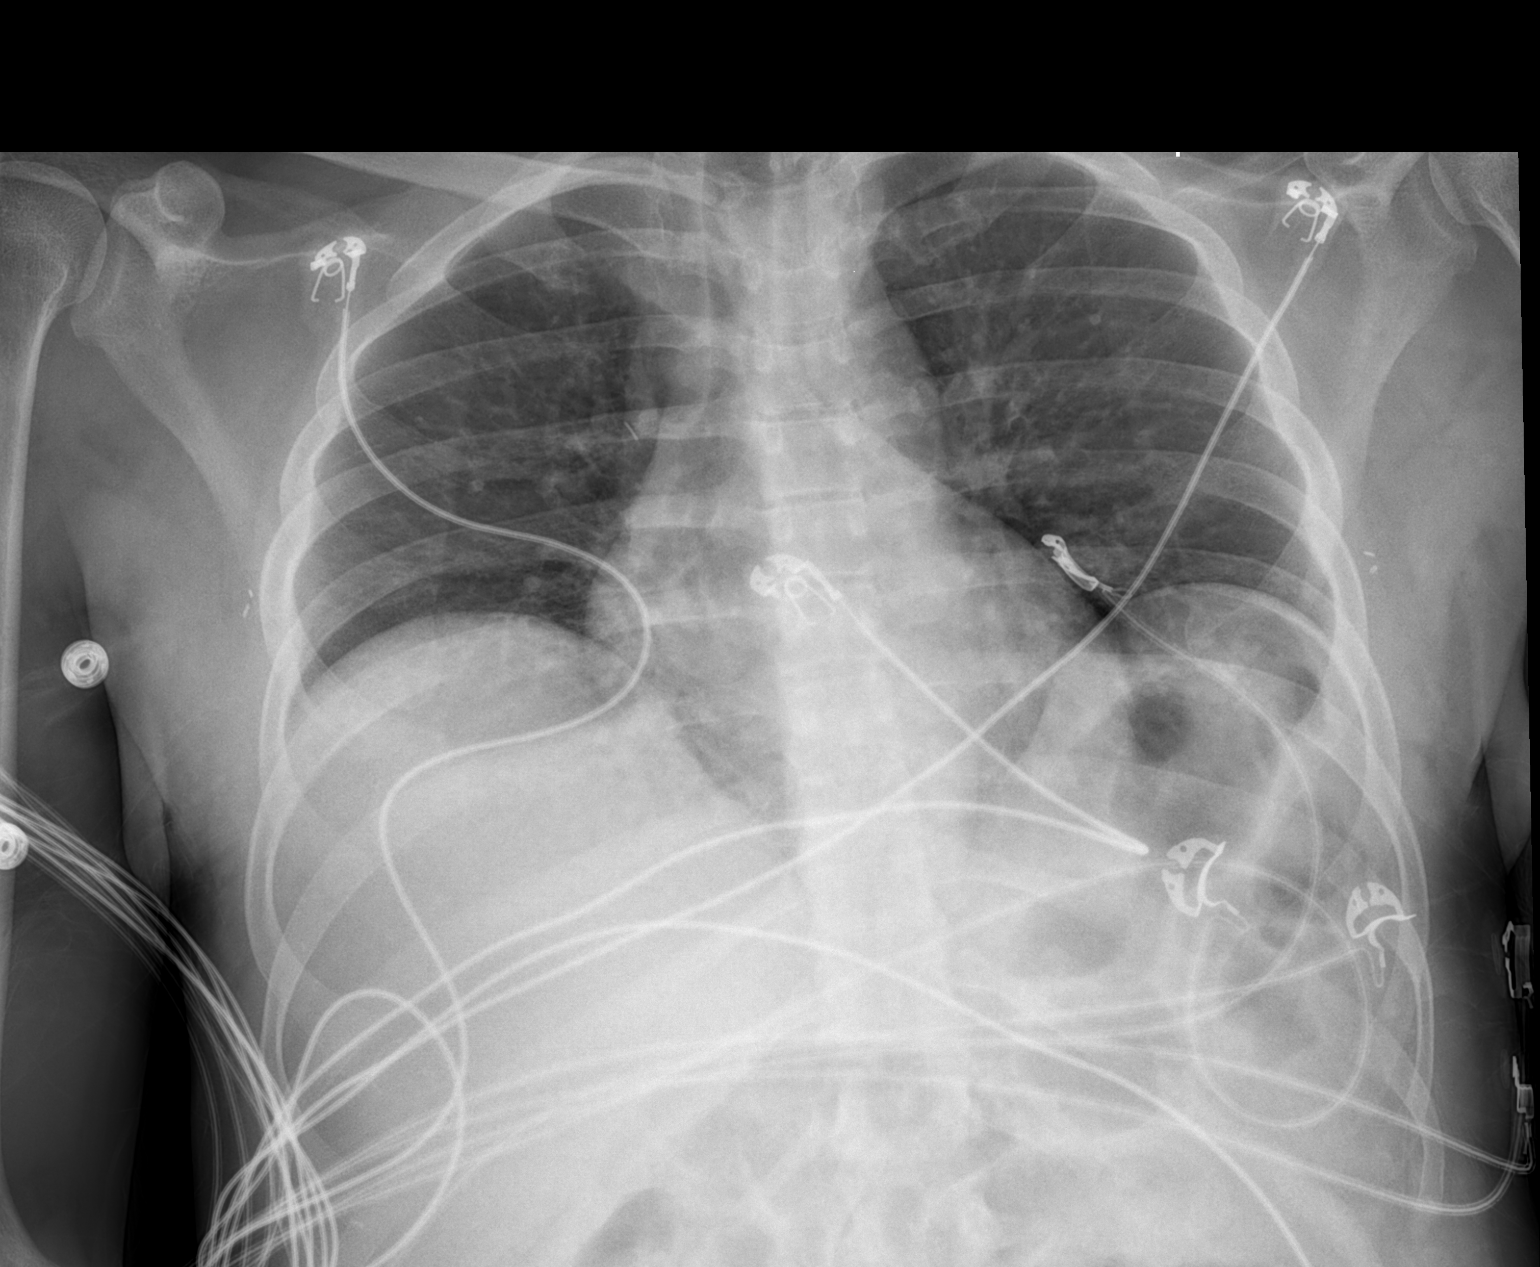

[1 of 1 positions shown; findings below may reference images not displayed]

FINDINGS: The heart size is normal. Lung volumes are low bilaterally. There is
no evidence of pulmonary edema, consolidation, pneumothorax, nodule
or pleural fluid. Visualized bony structures are unremarkable.
Stable small clips in both lateral chest walls.
IMPRESSION: Low bilateral lung volumes.  No active disease.

## 2017-02-26 IMAGING — US US ABDOMEN LIMITED
1 series · 14 of 25 positions shown · non-contrast
Comparison: CT 08/29/2014

CLINICAL DATA: Elevated LFTs.

EXAM:
US ABDOMEN LIMITED - RIGHT UPPER QUADRANT

[Series 1: us abdomen limited · 0.19mm/px · 14 of 30 slices shown]
[im 1/30]
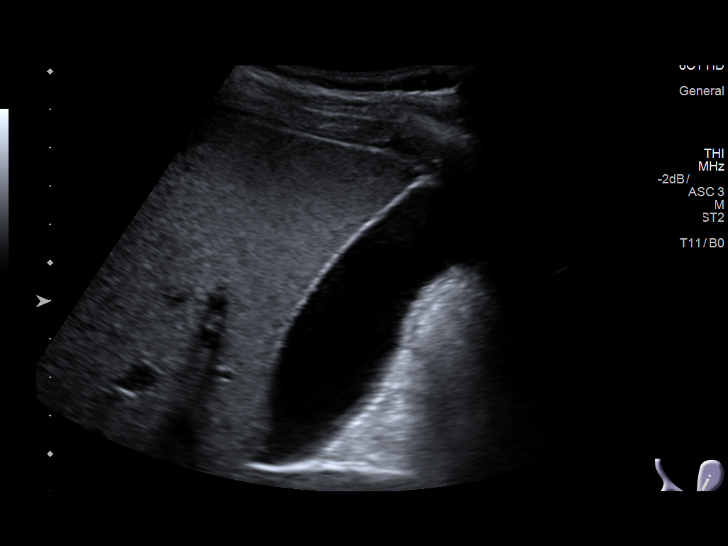
[im 3/30]
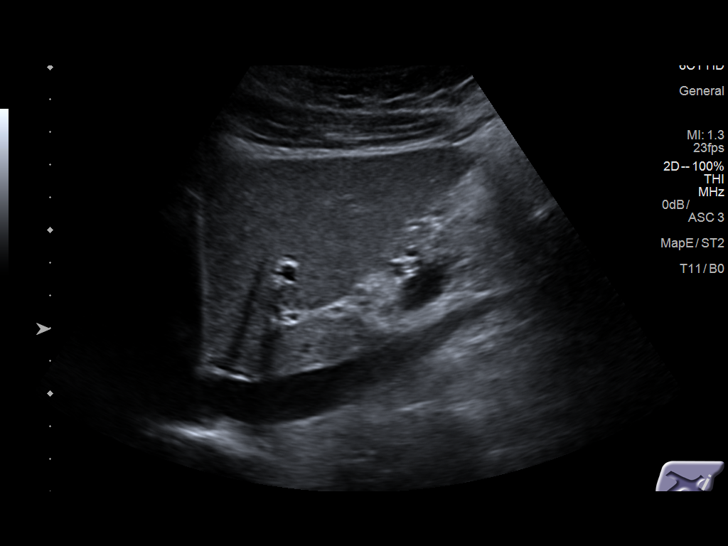
[im 5/30]
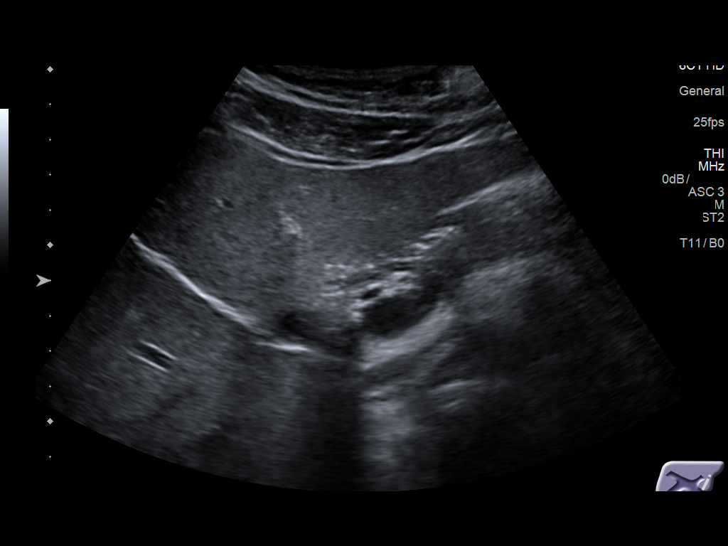
[im 8/30]
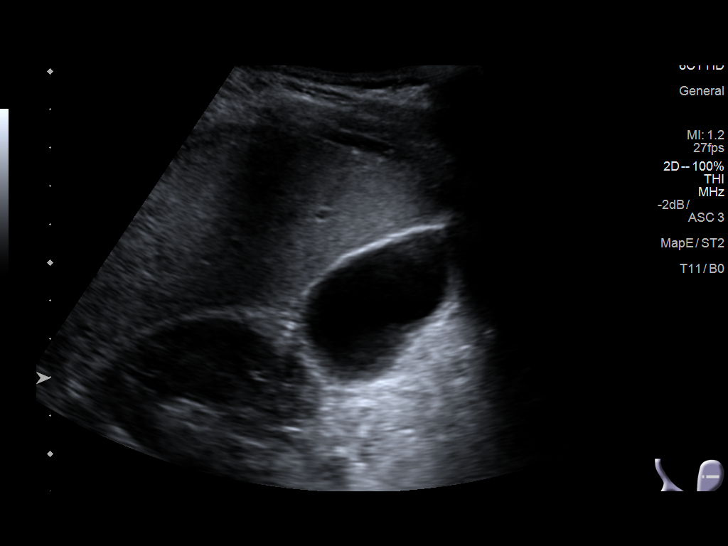
[im 10/30]
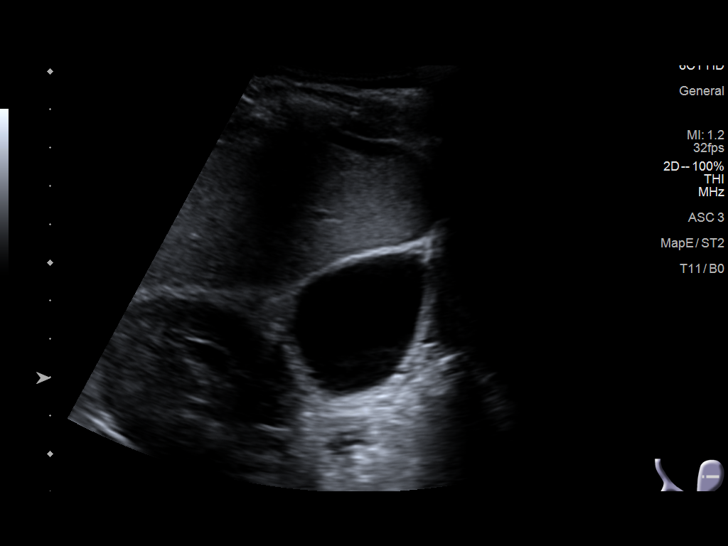
[im 11/30]
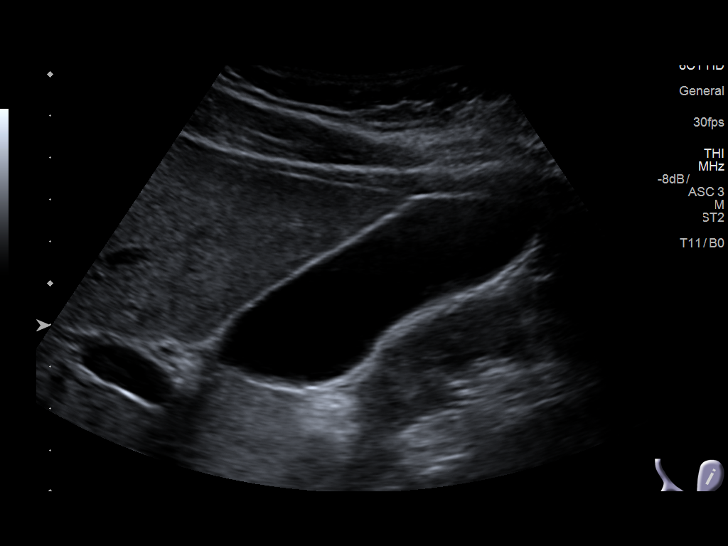
[im 14/30]
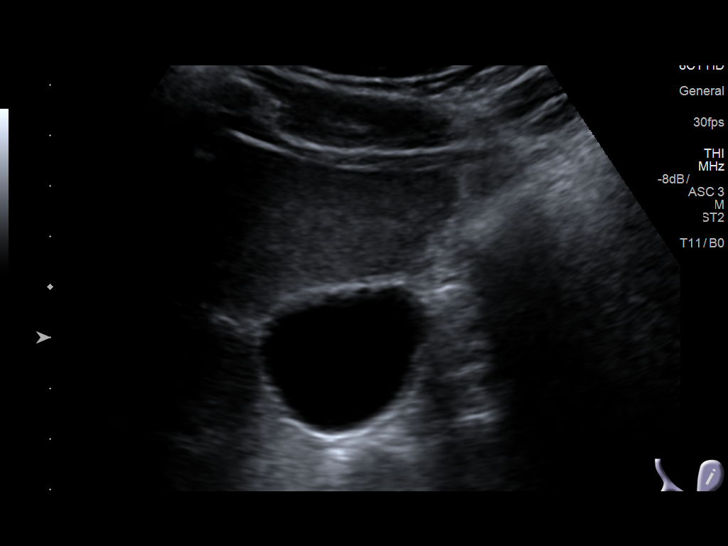
[im 16/30]
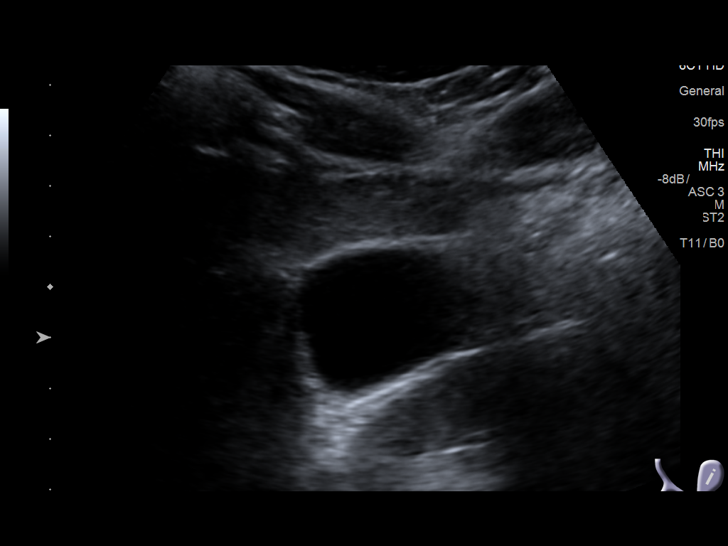
[im 19/30]
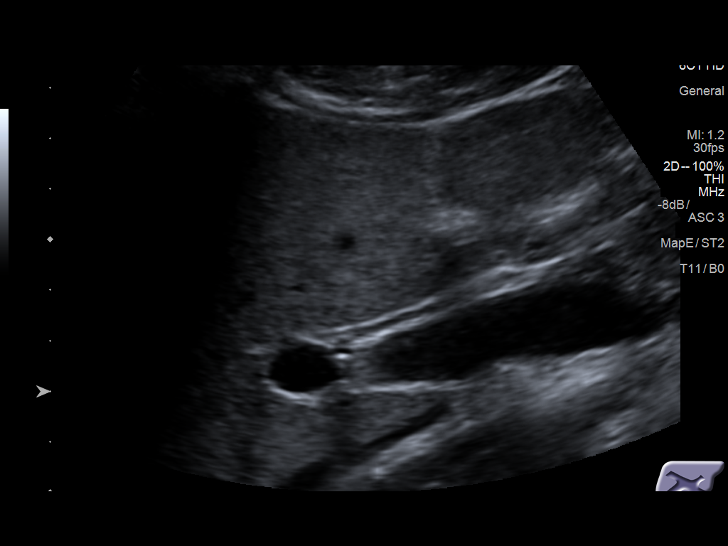
[im 20/30]
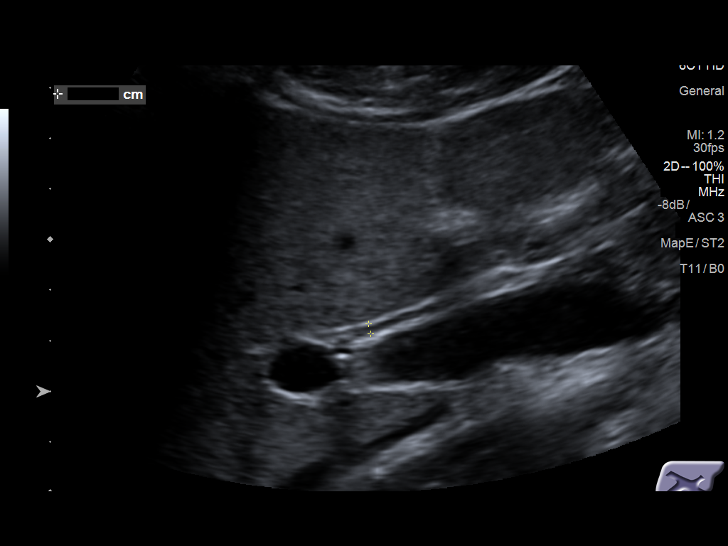
[im 22/30]
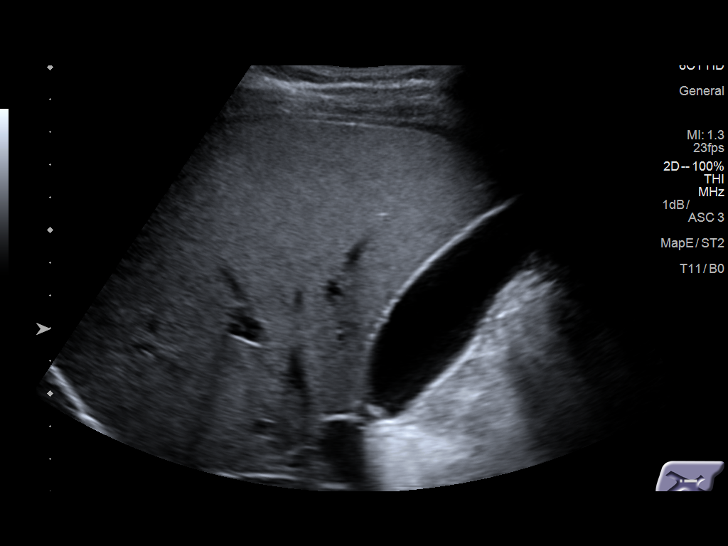
[im 25/30]
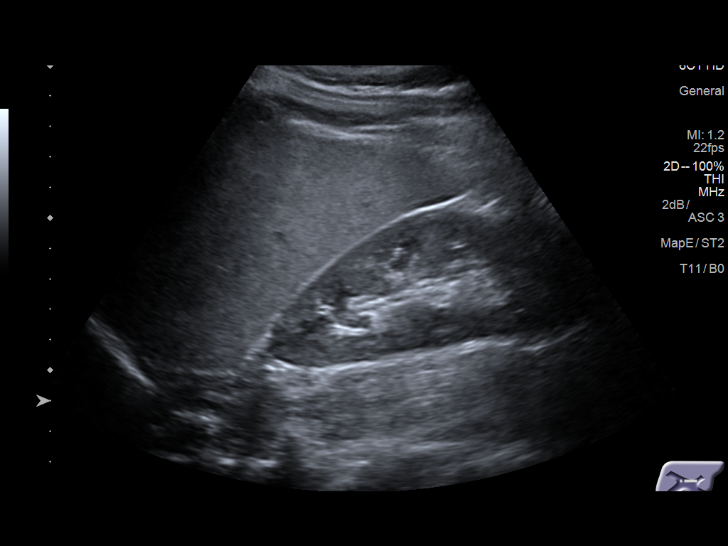
[im 27/30]
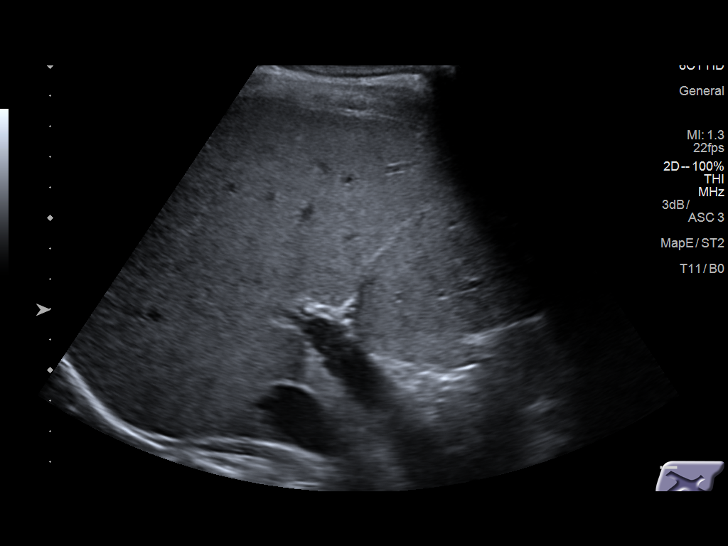
[im 30/30]
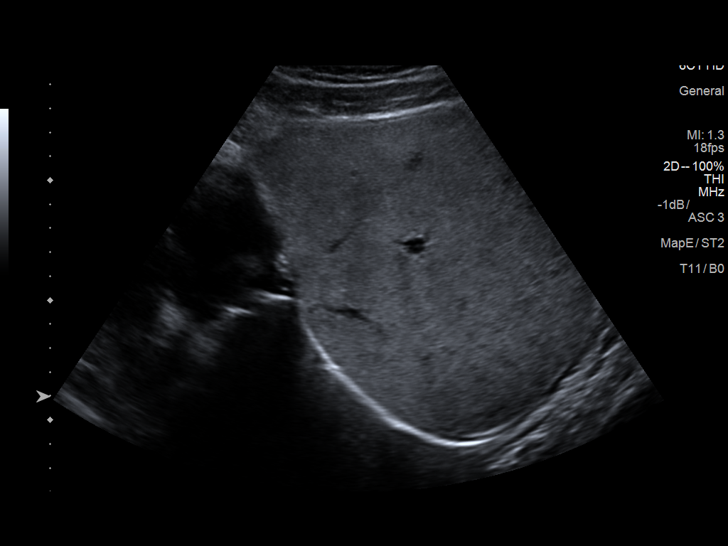

[14 of 25 positions shown; findings below may reference images not displayed]

FINDINGS: Gallbladder:

No gallstones or wall thickening visualized. No sonographic Murphy
sign noted.

Common bile duct:

Diameter: Normal caliber, 2 mm

Liver:

No focal lesion identified. Mildly increased echotexture suggesting
fatty infiltration as seen on prior CT.

Incidentally noted is trace free fluid adjacent to the liver.
IMPRESSION: Fatty infiltration of the liver. Trace free fluid adjacent to the
liver.

## 2017-05-28 IMAGING — MR MR HEAD WO/W CM
10 of 13 series · 35 of 48 positions shown · IV contrast (multihance)
Comparison: CT head without contrast 12/25/2014.

CLINICAL DATA: Woke at 5 a.m. with dizziness and headache. Vomiting
and diarrhea. Hypertension.

EXAM:
MRI HEAD WITHOUT AND WITH CONTRAST
TECHNIQUE: Multiplanar, multiecho pulse sequences of the brain and surrounding
structures were obtained without and with intravenous contrast.
CONTRAST:  15mL MULTIHANCE GADOBENATE DIMEGLUMINE 529 MG/ML IV SOLN

[Series 3: T1 · sagittal · 5.0mm · 0.47mm/px · 1 of 24 slices shown]
[im 1/24]
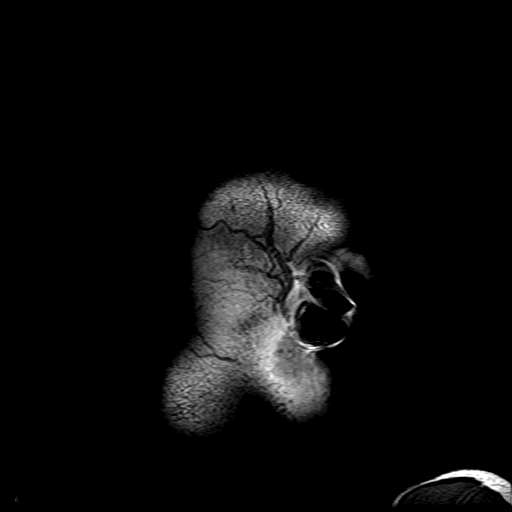

[Series 4: DWI · axial · 3.0mm · 1.09mm/px · z∈[-27,+111]mm · 9 of 98 slices shown (1 of 4)]
[im 1/98]
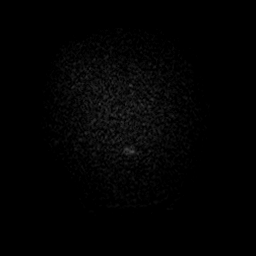
[im 13/98]
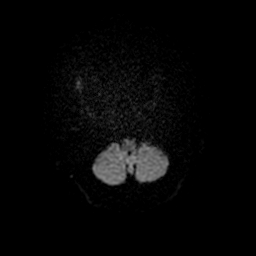
[im 25/98]
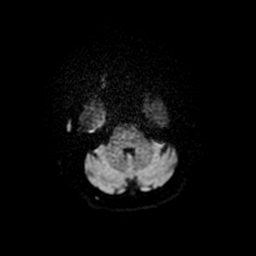
[im 37/98]
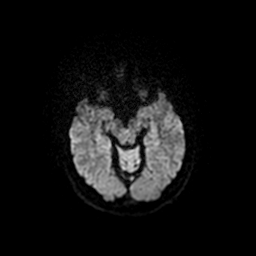
[im 49/98]
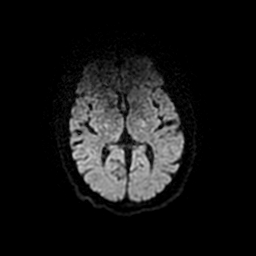
[im 61/98]
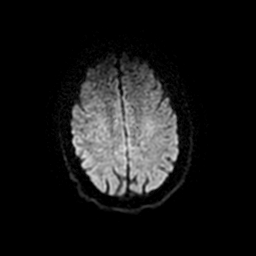
[im 73/98]
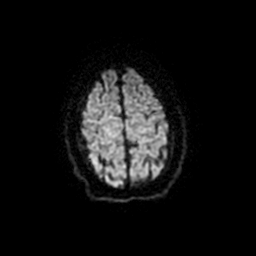
[im 85/98]
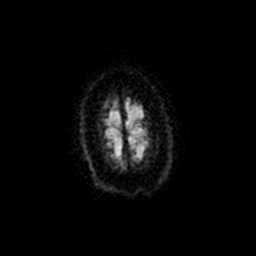
[im 98/98]
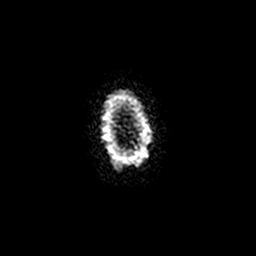

[Series 5: DWI · coronal · 5.0mm · 1.09mm/px · 7 of 76 slices shown (2 of 4)]
[im 1/76]
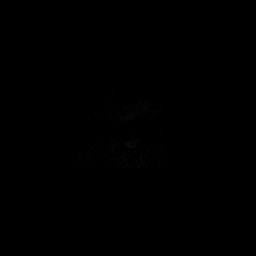
[im 13/76]
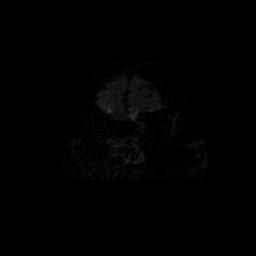
[im 26/76]
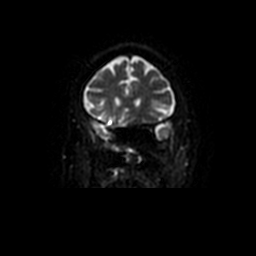
[im 38/76]
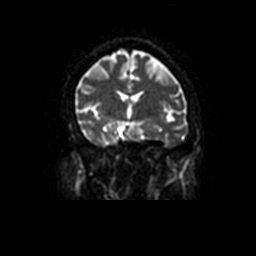
[im 51/76]
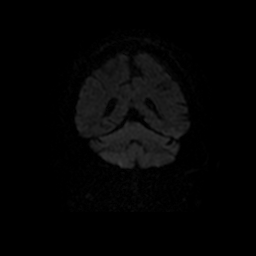
[im 63/76]
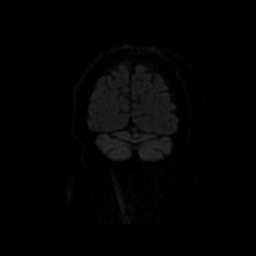
[im 76/76]
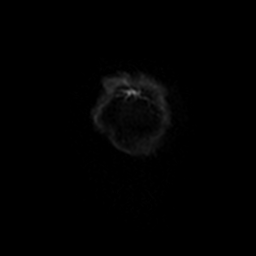

[Series 6: T2 · axial · 5.0mm · 0.43mm/px · z∈[-36,+100]mm · 2 of 24 slices shown (1 of 2)]
[im 1/24]
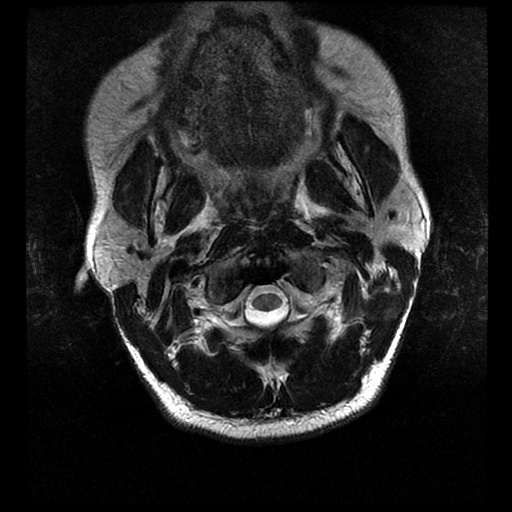
[im 24/24]
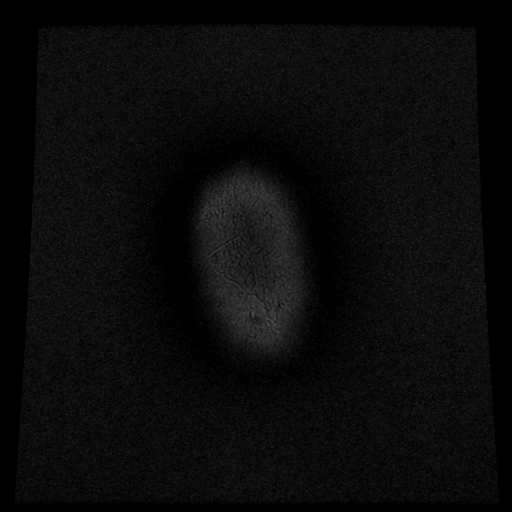

[Series 7: FLAIR · axial · 5.0mm · 0.43mm/px · z∈[-41,+106]mm · 2 of 24 slices shown]
[im 1/24]
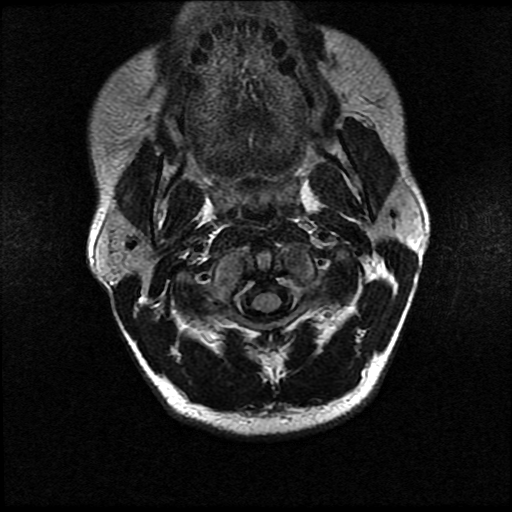
[im 24/24]
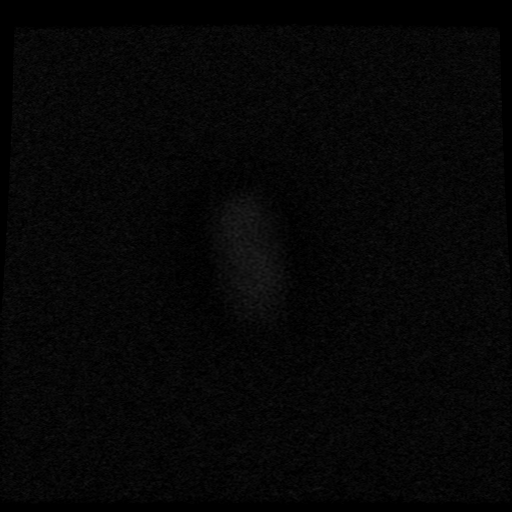

[Series 8: T2 · coronal · 3.0mm · 0.39mm/px · 2 of 24 slices shown (2 of 2)]
[im 1/24]
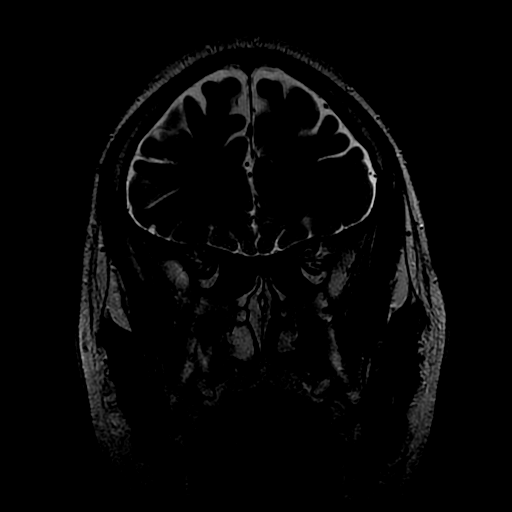
[im 24/24]
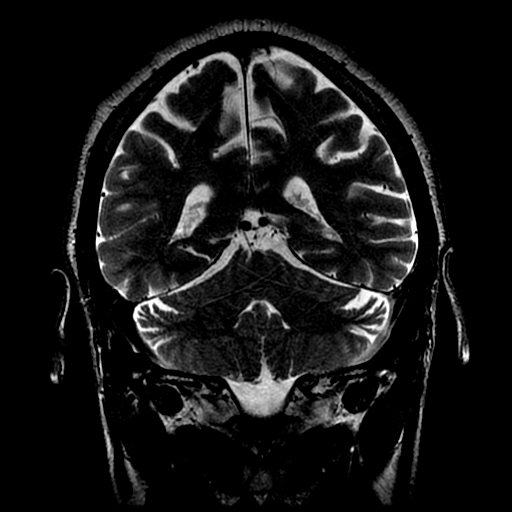

[Series 11: T2 post-contrast · coronal · 5.0mm · 0.45mm/px · 3 of 29 slices shown]
[im 1/29]
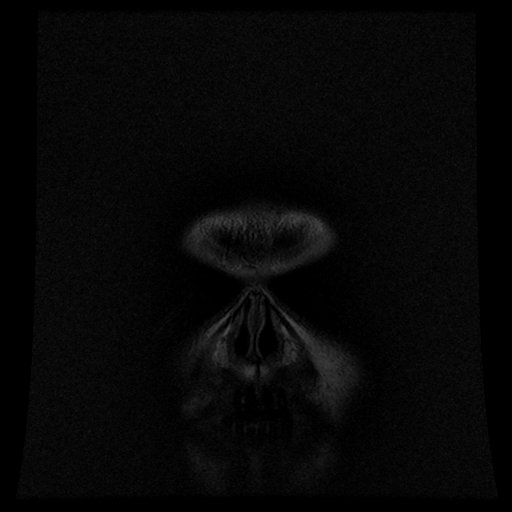
[im 15/29]
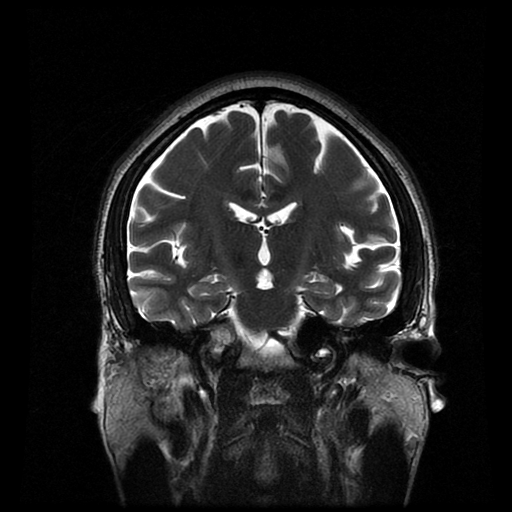
[im 29/29]
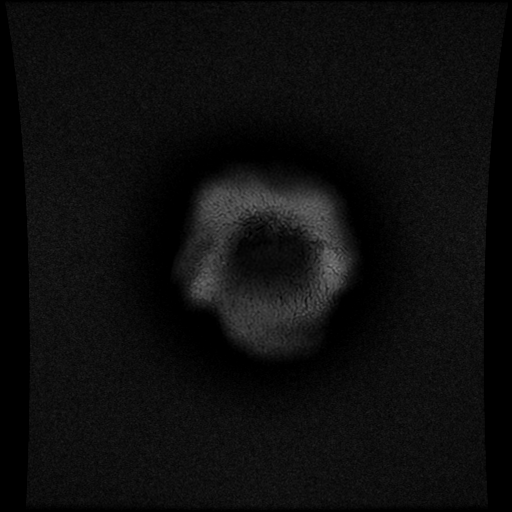

[Series 14: T1 post-contrast · sagittal · 5.0mm · 0.47mm/px · 2 of 24 slices shown]
[im 1/24]
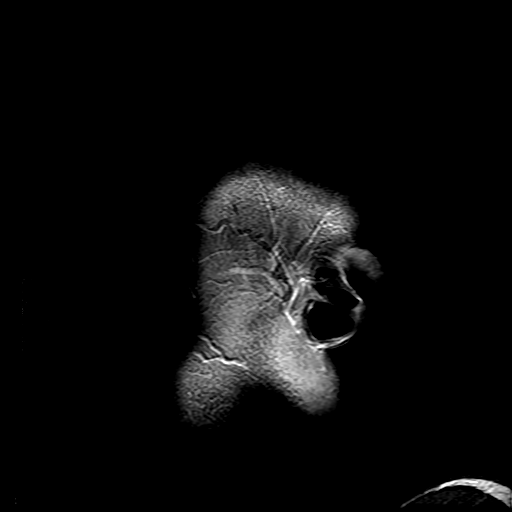
[im 24/24]
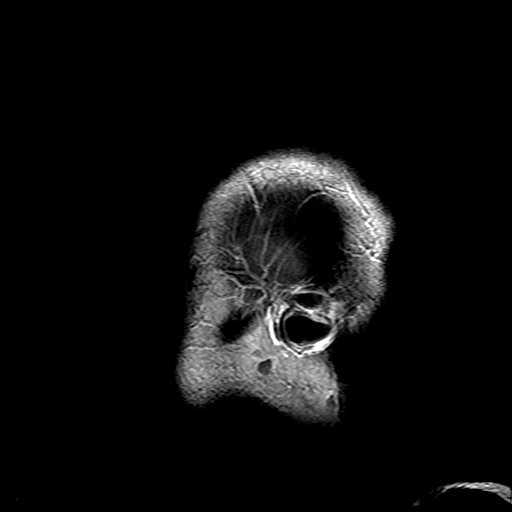

[Series 400: DWI · axial · 3.0mm · 1.09mm/px · z∈[-27,+111]mm · 4 of 49 slices shown (3 of 4)]
[im 1/49]
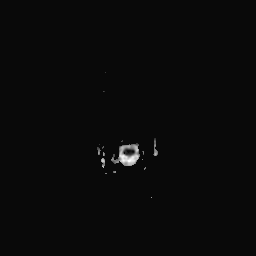
[im 17/49]
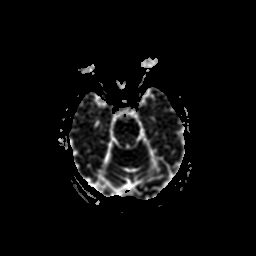
[im 33/49]
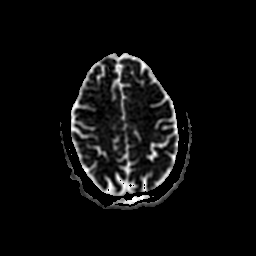
[im 49/49]
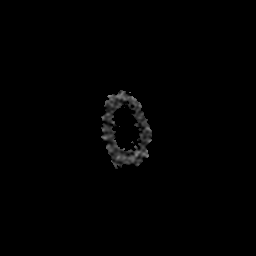

[Series 500: DWI · coronal · 5.0mm · 1.09mm/px · 3 of 38 slices shown (4 of 4)]
[im 1/38]
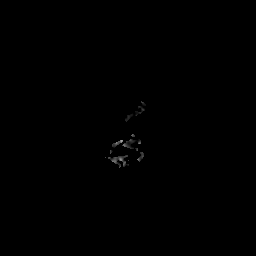
[im 19/38]
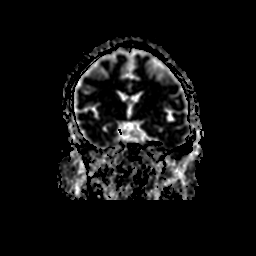
[im 38/38]
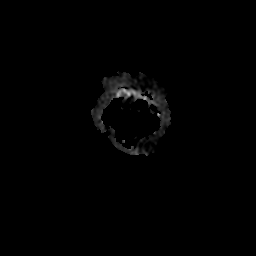

[35 of 48 positions shown; findings below may reference images not displayed]

FINDINGS: The diffusion-weighted images demonstrate no evidence for acute or
subacute infarction. No acute hemorrhage or mass lesion is present.
There is no significant white matter disease.

Flow is present in the major intracranial arteries. The globes and
orbits are intact. Small polyps or mucous retention cysts are noted
in the maxillary sinuses. The paranasal sinuses and mastoid air
cells are otherwise clear. The skullbase is within normal limits.
Midline structures are normal.

The postcontrast images demonstrate no pathologic enhancement.

Dedicated imaging of the temporal lobes demonstrate symmetric size
and signal of the hippocampal structures.
IMPRESSION: 1. Normal MRI of the brain without and with contrast.

## 2017-05-28 IMAGING — CT CT HEAD W/O CM
2 series · 16 of 30 positions shown, 20 images · non-contrast
Comparison: 09/09/2014

CLINICAL DATA: Vertigo this morning. Vomiting, diarrhea.
Generalized headache.

EXAM:
CT HEAD WITHOUT CONTRAST
TECHNIQUE: Contiguous axial images were obtained from the base of the skull
through the vertex without intravenous contrast.

[Series 2: head w/o · axial · non-contrast · 0.45mm/px · z∈[-138,-18]mm · 13 of 29 slices shown, 17 images]
[im 3/29  brain]
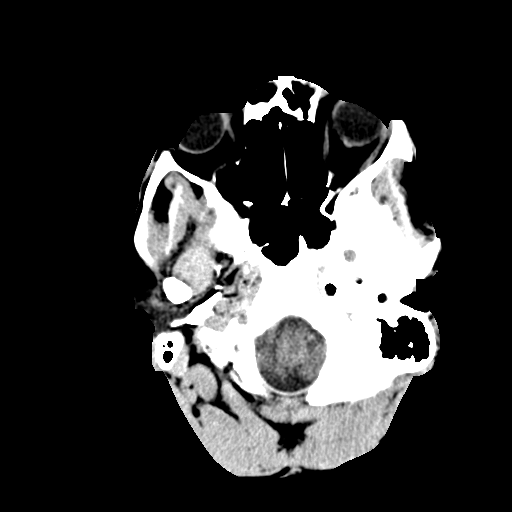
[im 3/29  bone]
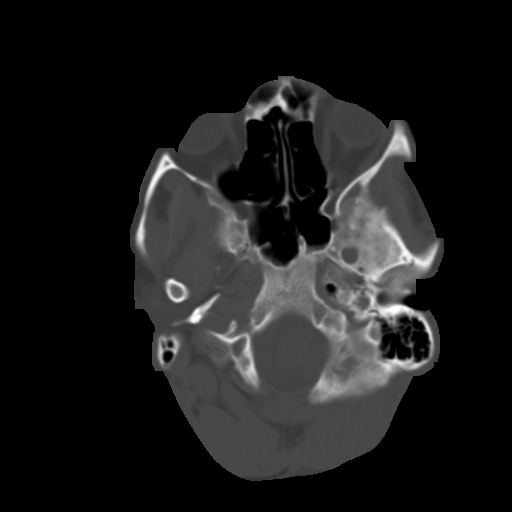
[im 5/29  brain]
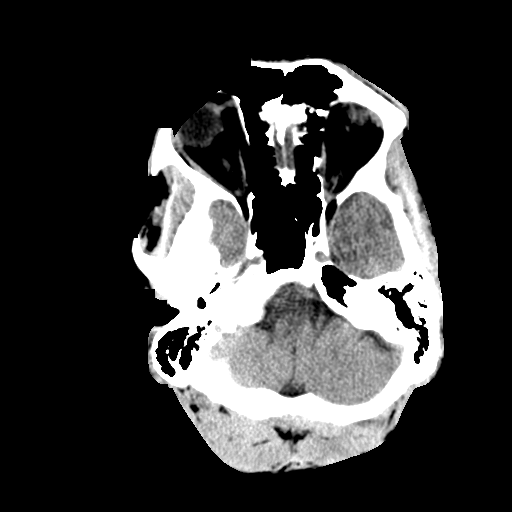
[im 7/29  brain]
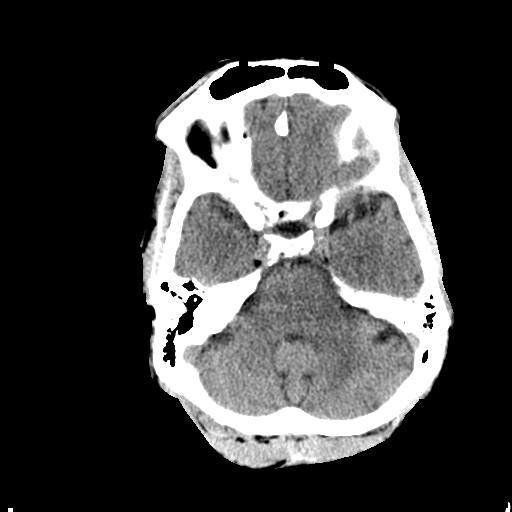
[im 9/29  brain]
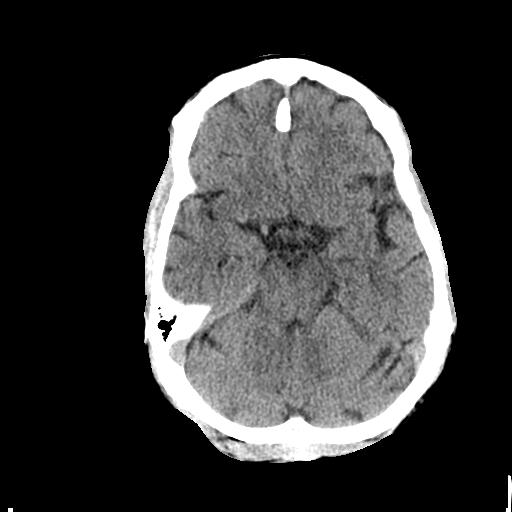
[im 11/29  brain]
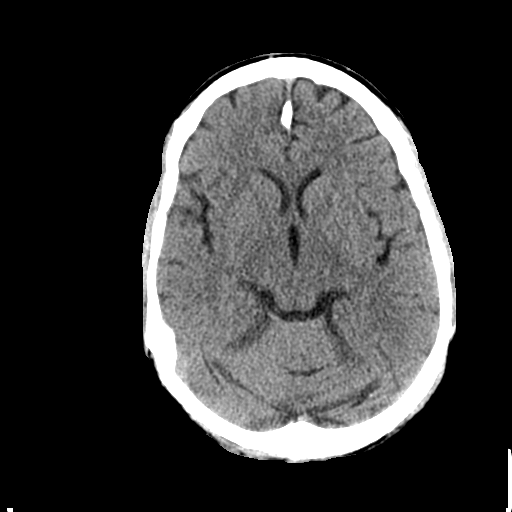
[im 11/29  bone]
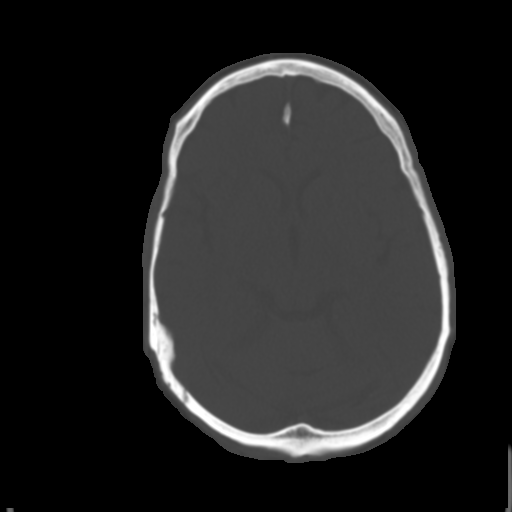
[im 13/29  brain]
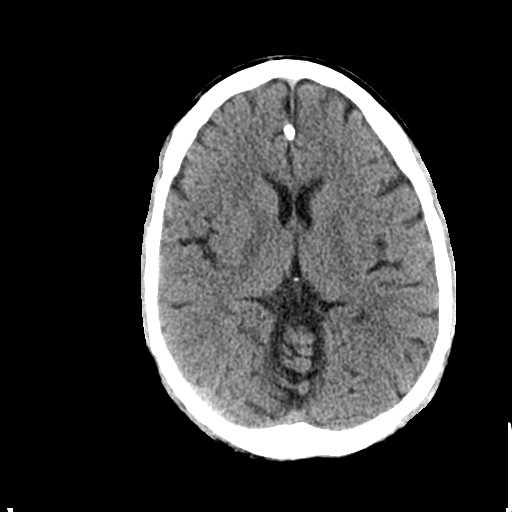
[im 15/29  brain]
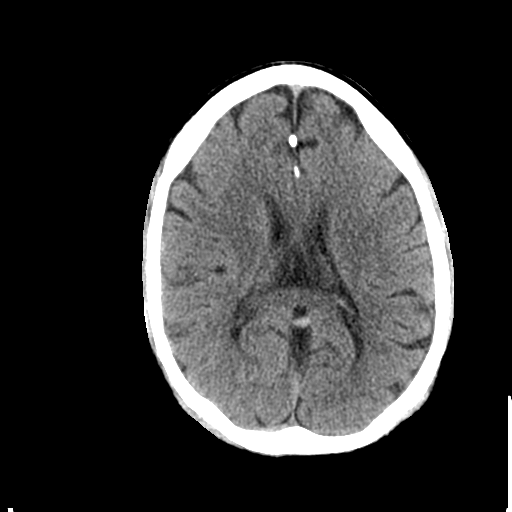
[im 17/29  brain]
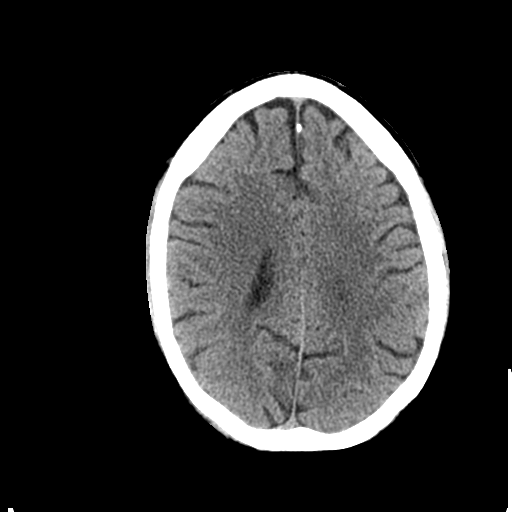
[im 19/29  brain]
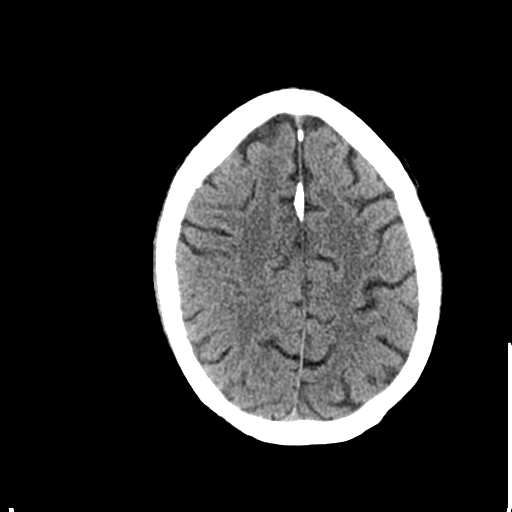
[im 19/29  bone]
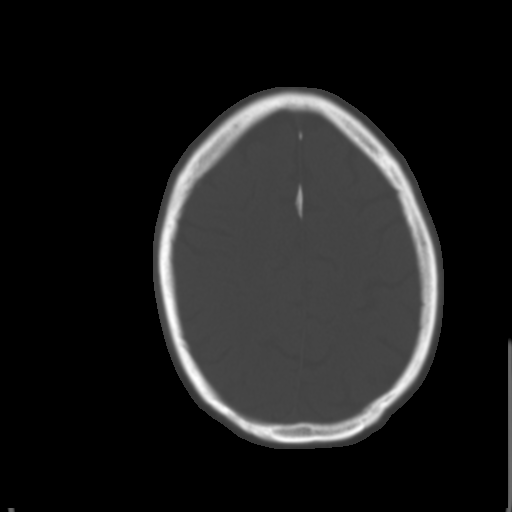
[im 21/29  brain]
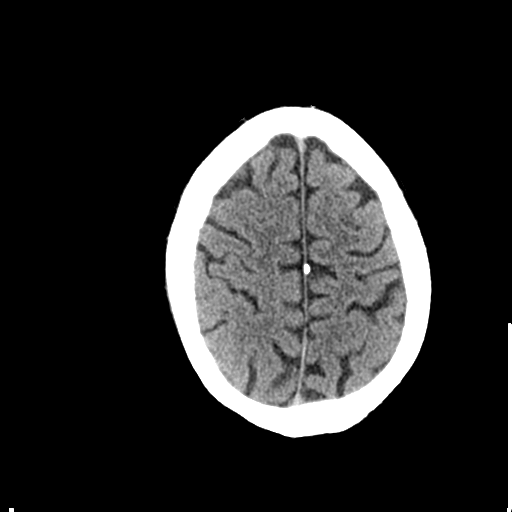
[im 23/29  brain]
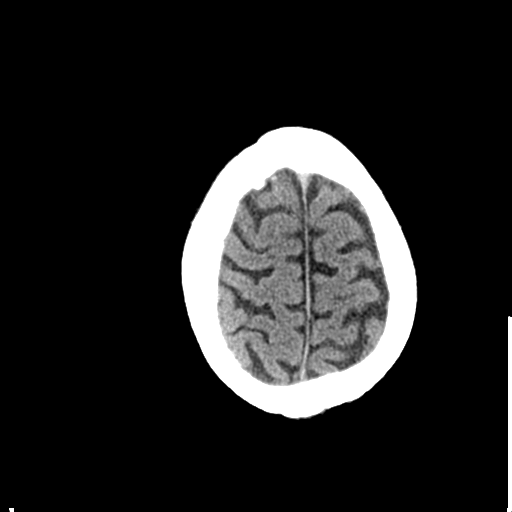
[im 25/29  brain]
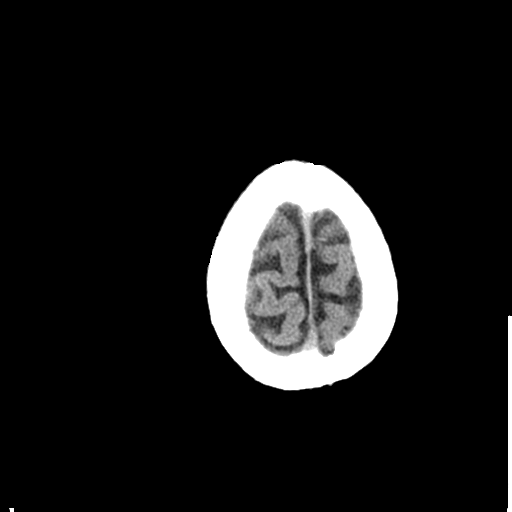
[im 27/29  brain]
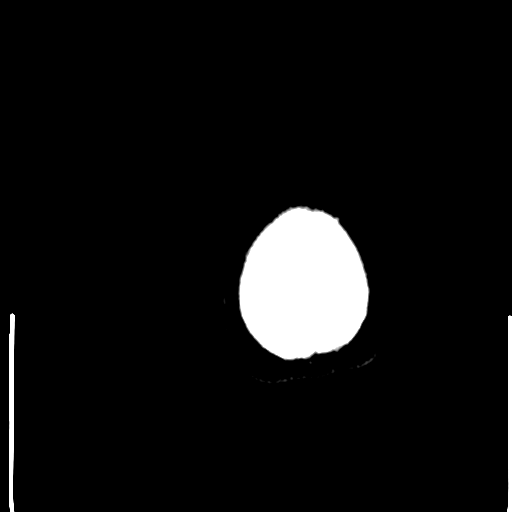
[im 27/29  bone]
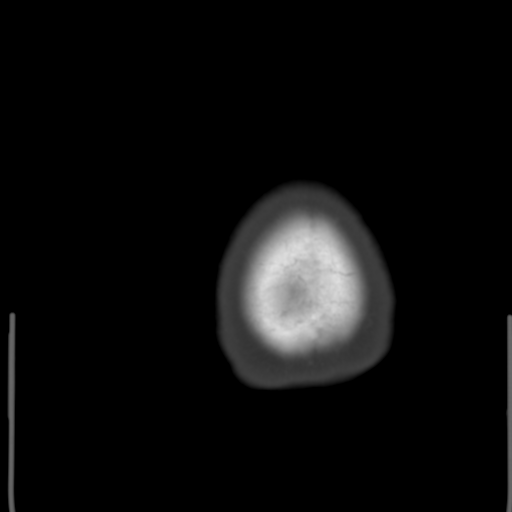

[Series 3: bone windows · axial · 0.45mm/px · z∈[-138,-98]mm · 3 of 29 slices shown]
[im 3/29  bone]
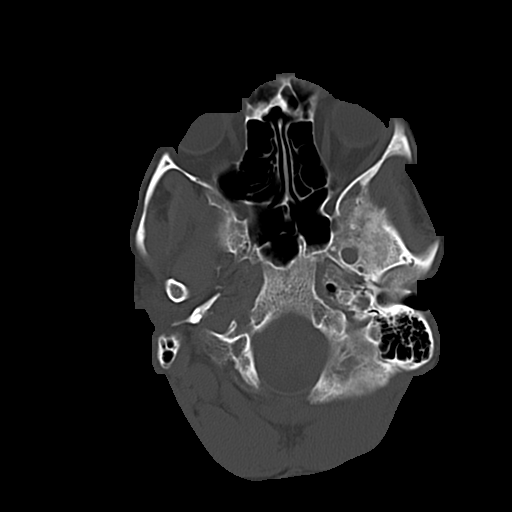
[im 7/29  bone]
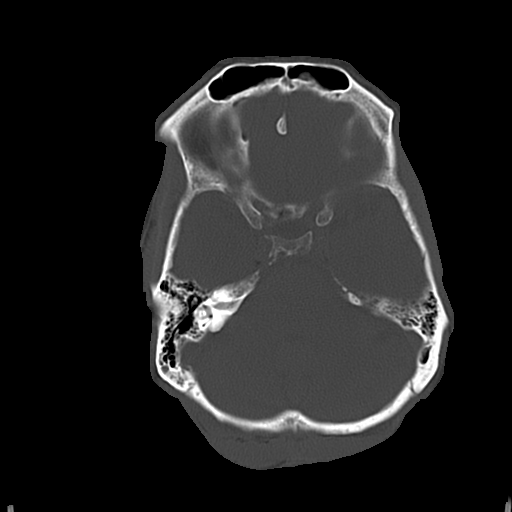
[im 11/29  bone]
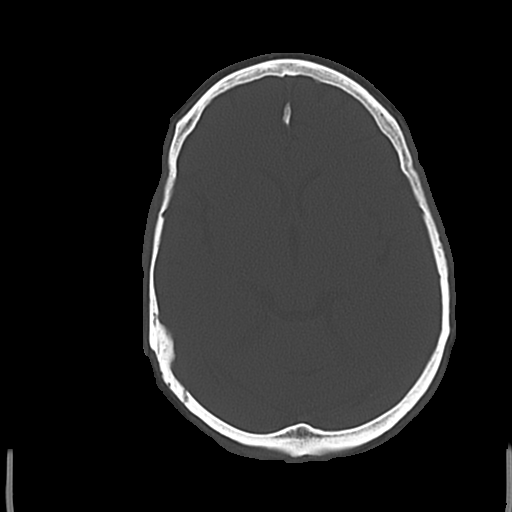

[16 of 30 positions shown; findings below may reference images not displayed]

FINDINGS: No acute intracranial abnormality. Specifically, no hemorrhage,
hydrocephalus, mass lesion, acute infarction, or significant
intracranial injury. No acute calvarial abnormality. Visualized
paranasal sinuses and mastoids clear. Orbital soft tissues
unremarkable.
IMPRESSION: Negative.

## 2017-06-20 IMAGING — CR DG ABDOMEN ACUTE W/ 1V CHEST
3 series · 3 of 3 positions shown · non-contrast
Comparison: 09/09/2014

CLINICAL DATA: Epigastric pain for 1 day

EXAM:
DG ABDOMEN ACUTE W/ 1V CHEST

[w chest pa]
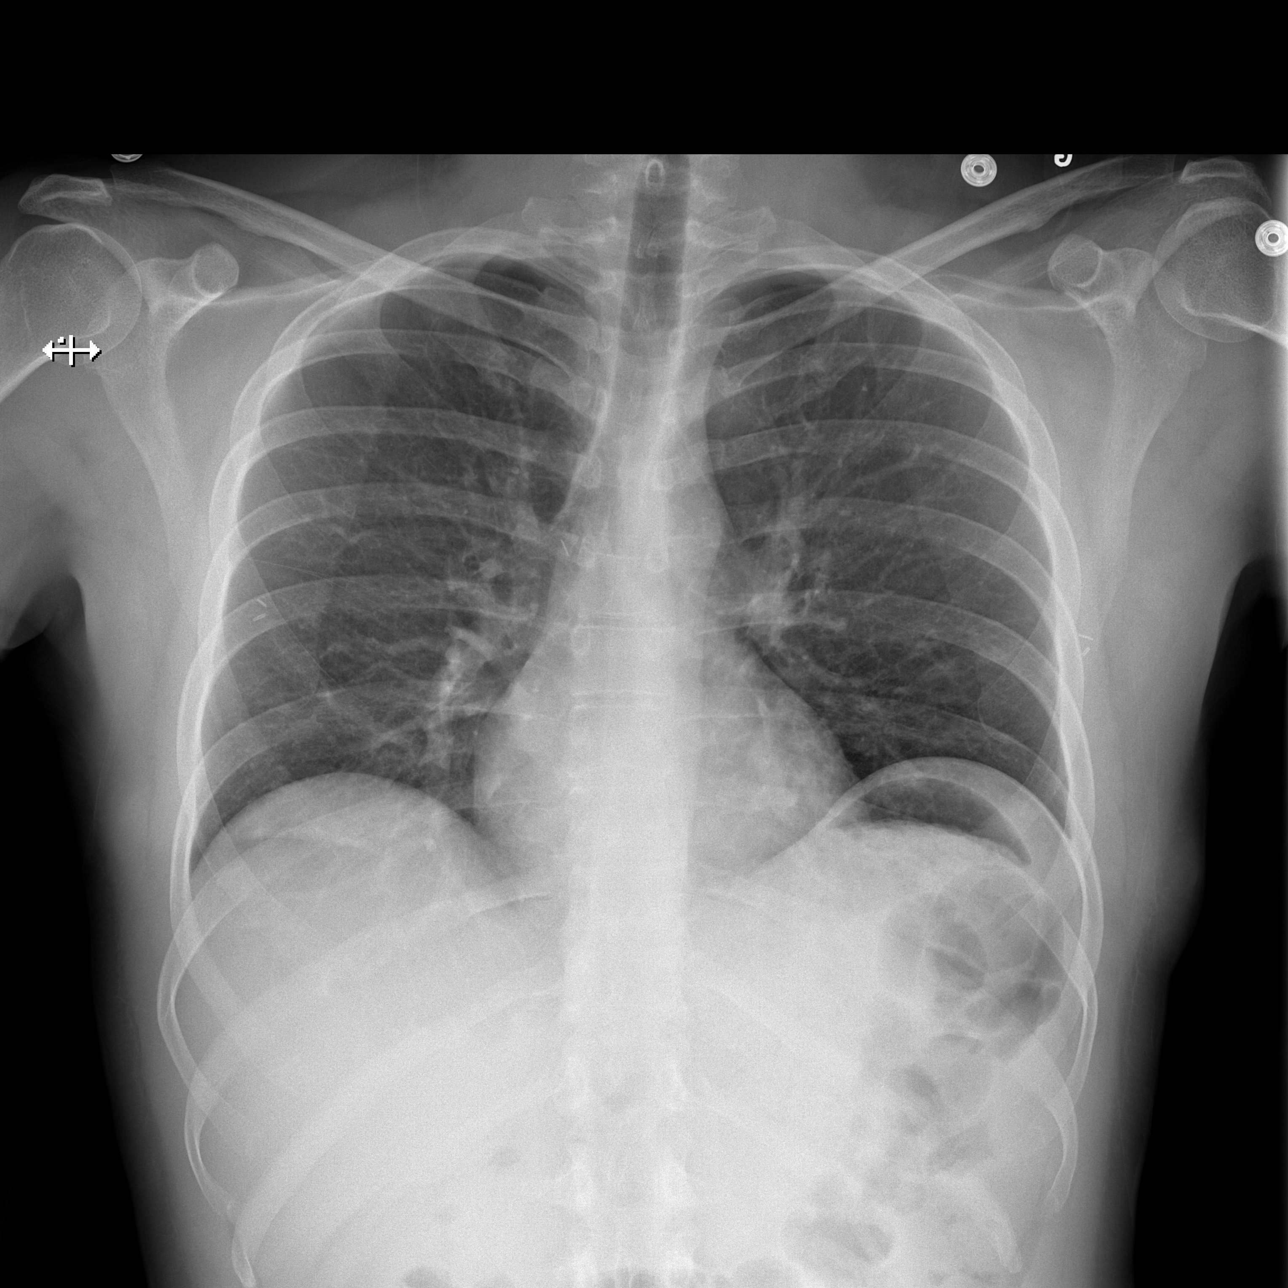

[w abdomen upright]
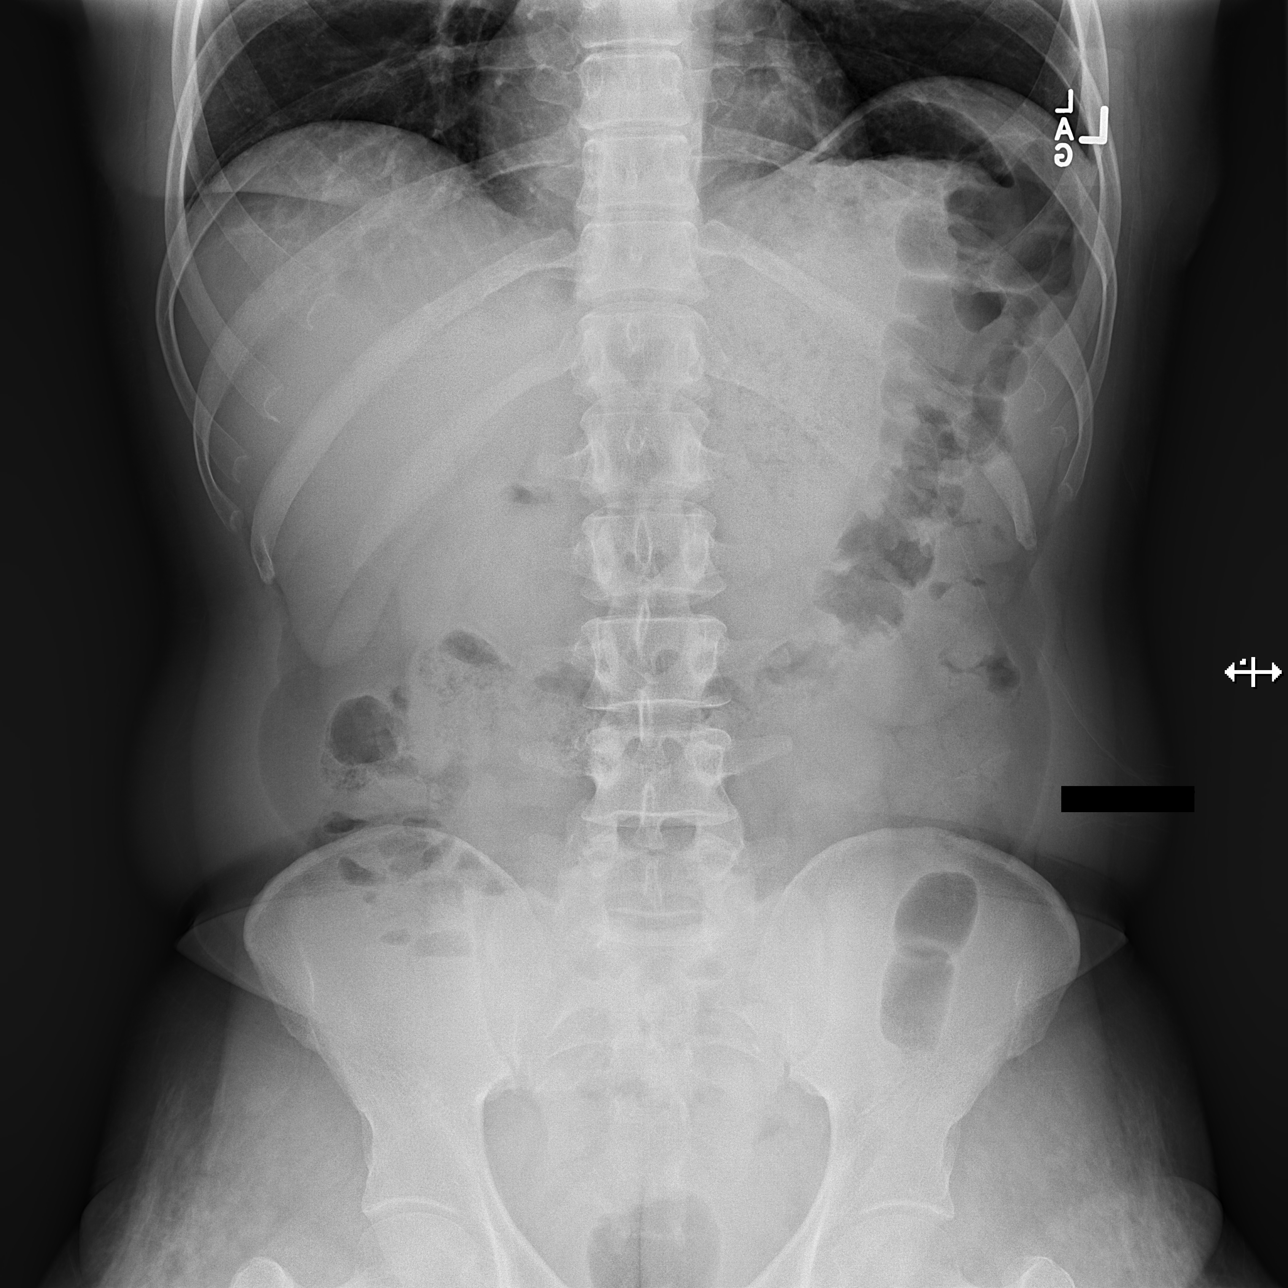

[t abdomen supine]
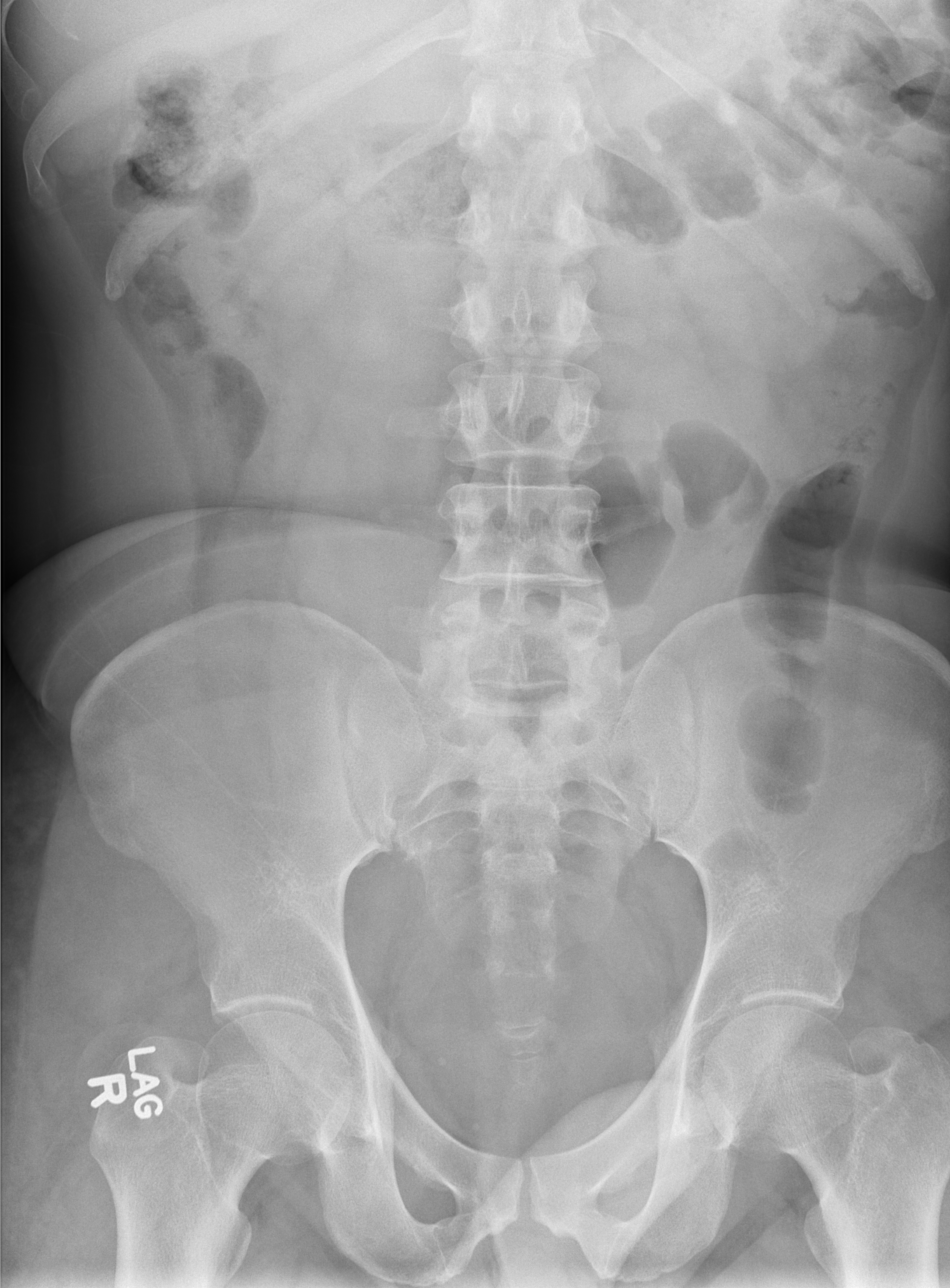

[3 of 3 positions shown; findings below may reference images not displayed]

FINDINGS: Cardiac shadow is within normal limits. The lungs are well aerated
bilaterally.

Scattered large and small bowel gas is noted within the abdomen. No
free air is seen. No obstructive changes are noted.
IMPRESSION: Negative abdominal radiographs.  No acute cardiopulmonary disease.

## 2017-07-25 ENCOUNTER — Encounter (HOSPITAL_COMMUNITY): Payer: Self-pay | Admitting: Nurse Practitioner

## 2017-07-25 ENCOUNTER — Emergency Department (HOSPITAL_COMMUNITY): Payer: Self-pay

## 2017-07-25 ENCOUNTER — Emergency Department (HOSPITAL_COMMUNITY)
Admission: EM | Admit: 2017-07-25 | Discharge: 2017-07-25 | Disposition: A | Discharge status: Home / Self Care | Payer: Self-pay | Attending: Emergency Medicine | Admitting: Emergency Medicine

## 2017-07-25 DIAGNOSIS — R443 Hallucinations, unspecified: Secondary | ICD-10-CM | POA: Insufficient documentation

## 2017-07-25 DIAGNOSIS — Y998 Other external cause status: Secondary | ICD-10-CM | POA: Insufficient documentation

## 2017-07-25 DIAGNOSIS — Z79899 Other long term (current) drug therapy: Secondary | ICD-10-CM | POA: Insufficient documentation

## 2017-07-25 DIAGNOSIS — R109 Unspecified abdominal pain: Secondary | ICD-10-CM | POA: Insufficient documentation

## 2017-07-25 DIAGNOSIS — Z23 Encounter for immunization: Secondary | ICD-10-CM | POA: Insufficient documentation

## 2017-07-25 DIAGNOSIS — S42115A Nondisplaced fracture of body of scapula, left shoulder, initial encounter for closed fracture: Secondary | ICD-10-CM | POA: Insufficient documentation

## 2017-07-25 DIAGNOSIS — R079 Chest pain, unspecified: Secondary | ICD-10-CM | POA: Insufficient documentation

## 2017-07-25 DIAGNOSIS — Y939 Activity, unspecified: Secondary | ICD-10-CM | POA: Insufficient documentation

## 2017-07-25 DIAGNOSIS — S12600A Unspecified displaced fracture of seventh cervical vertebra, initial encounter for closed fracture: Secondary | ICD-10-CM | POA: Insufficient documentation

## 2017-07-25 DIAGNOSIS — S59902A Unspecified injury of left elbow, initial encounter: Secondary | ICD-10-CM

## 2017-07-25 DIAGNOSIS — Y92410 Unspecified street and highway as the place of occurrence of the external cause: Secondary | ICD-10-CM | POA: Insufficient documentation

## 2017-07-25 HISTORY — DX: Malignant (primary) neoplasm, unspecified: C80.1

## 2017-07-25 LAB — I-STAT CHEM 8, ED
BUN: 8 mg/dL (ref 6–20)
CALCIUM ION: 0.89 mmol/L — AB (ref 1.15–1.40)
Chloride: 97 mmol/L — ABNORMAL LOW (ref 101–111)
Creatinine, Ser: 1.4 mg/dL — ABNORMAL HIGH (ref 0.61–1.24)
Glucose, Bld: 85 mg/dL (ref 65–99)
HEMATOCRIT: 45 % (ref 39.0–52.0)
Hemoglobin: 15.3 g/dL (ref 13.0–17.0)
Potassium: 4.7 mmol/L (ref 3.5–5.1)
SODIUM: 133 mmol/L — AB (ref 135–145)
TCO2: 28 mmol/L (ref 22–32)

## 2017-07-25 LAB — COMPREHENSIVE METABOLIC PANEL
ALBUMIN: 2 g/dL — AB (ref 3.5–5.0)
ALK PHOS: 35 U/L — AB (ref 38–126)
ALT: 30 U/L (ref 17–63)
ANION GAP: 5 (ref 5–15)
AST: 65 U/L — ABNORMAL HIGH (ref 15–41)
BILIRUBIN TOTAL: 0.7 mg/dL (ref 0.3–1.2)
BUN: 8 mg/dL (ref 6–20)
CALCIUM: 4.8 mg/dL — AB (ref 8.9–10.3)
CO2: 18 mmol/L — ABNORMAL LOW (ref 22–32)
Chloride: 115 mmol/L — ABNORMAL HIGH (ref 101–111)
Creatinine, Ser: 0.48 mg/dL — ABNORMAL LOW (ref 0.61–1.24)
GLUCOSE: 63 mg/dL — AB (ref 65–99)
POTASSIUM: 3 mmol/L — AB (ref 3.5–5.1)
Sodium: 138 mmol/L (ref 135–145)
TOTAL PROTEIN: 4 g/dL — AB (ref 6.5–8.1)

## 2017-07-25 LAB — PROTIME-INR
INR: 1.02
Prothrombin Time: 13.3 seconds (ref 11.4–15.2)

## 2017-07-25 LAB — CBC
HEMATOCRIT: 46.3 % (ref 39.0–52.0)
HEMOGLOBIN: 16 g/dL (ref 13.0–17.0)
MCH: 29.3 pg (ref 26.0–34.0)
MCHC: 34.6 g/dL (ref 30.0–36.0)
MCV: 84.6 fL (ref 78.0–100.0)
Platelets: 113 10*3/uL — ABNORMAL LOW (ref 150–400)
RBC: 5.47 MIL/uL (ref 4.22–5.81)
RDW: 14.3 % (ref 11.5–15.5)
WBC: 6.6 10*3/uL (ref 4.0–10.5)

## 2017-07-25 LAB — SAMPLE TO BLOOD BANK

## 2017-07-25 LAB — I-STAT CG4 LACTIC ACID, ED: LACTIC ACID, VENOUS: 4.01 mmol/L — AB (ref 0.5–1.9)

## 2017-07-25 LAB — ETHANOL: Alcohol, Ethyl (B): 367 mg/dL (ref <10)

## 2017-07-25 LAB — CDS SEROLOGY

## 2017-07-25 MED ORDER — FENTANYL CITRATE (PF) 100 MCG/2ML IJ SOLN
INTRAMUSCULAR | Status: AC
  Filled 2017-07-25: qty 2

## 2017-07-25 MED ORDER — HYDROMORPHONE HCL 1 MG/ML IJ SOLN
1.0000 mg | Freq: Once | INTRAMUSCULAR | Status: AC
  Administered 2017-07-25: 1 mg via INTRAVENOUS
  Filled 2017-07-25: qty 1

## 2017-07-25 MED ORDER — FENTANYL CITRATE (PF) 100 MCG/2ML IJ SOLN
50.0000 ug | Freq: Once | INTRAMUSCULAR | Status: AC
Start: 2017-07-25 — End: 2017-07-25
  Administered 2017-07-25: 50 ug via INTRAVENOUS

## 2017-07-25 MED ORDER — TETANUS-DIPHTH-ACELL PERTUSSIS 5-2.5-18.5 LF-MCG/0.5 IM SUSP
0.5000 mL | Freq: Once | INTRAMUSCULAR | Status: AC
  Administered 2017-07-25: 0.5 mL via INTRAMUSCULAR
  Filled 2017-07-25: qty 0.5

## 2017-07-25 MED ORDER — IOPAMIDOL (ISOVUE-300) INJECTION 61%
INTRAVENOUS | Status: AC
  Administered 2017-07-25: 100 mL
  Filled 2017-07-25: qty 100

## 2017-07-25 MED ORDER — HYDROMORPHONE HCL 4 MG PO TABS: 4.0000 mg | ORAL_TABLET | Freq: Four times a day (QID) | ORAL | 0 refills | Status: DC | PRN
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
## 2017-07-25 NOTE — ED Notes (Signed)
Portable at bedside 

## 2017-07-25 NOTE — ED Notes (Signed)
CRITICAL LABS GIVEN TO DR.LOCKWOOD
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
## 2017-07-25 NOTE — ED Notes (Addendum)
Pt endorses hallucinations and requesting to speak to MD when discussing discharge. Family sts pt has stopped seeing psych medications last month but mother notes pt is well connected with [facility] and has medications with her. Md speaking to pt. Pt ambulatory with steady gait
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
## 2017-07-25 NOTE — ED Notes (Signed)
Patient transported to and from CT on monitor. VSS.

## 2017-07-26 ENCOUNTER — Emergency Department (HOSPITAL_COMMUNITY)
Admission: EM | Admit: 2017-07-26 | Discharge: 2017-07-28 | Disposition: A | Discharge status: Other Acute Inpatient Hospital | Payer: Self-pay | Attending: Emergency Medicine | Admitting: Emergency Medicine

## 2017-07-26 ENCOUNTER — Encounter (HOSPITAL_COMMUNITY): Payer: Self-pay | Admitting: Emergency Medicine

## 2017-07-26 ENCOUNTER — Encounter (HOSPITAL_COMMUNITY): Payer: Self-pay | Admitting: Family Medicine

## 2017-07-26 DIAGNOSIS — F1414 Cocaine abuse with cocaine-induced mood disorder: Secondary | ICD-10-CM

## 2017-07-26 DIAGNOSIS — F332 Major depressive disorder, recurrent severe without psychotic features: Secondary | ICD-10-CM | POA: Insufficient documentation

## 2017-07-26 DIAGNOSIS — F1014 Alcohol abuse with alcohol-induced mood disorder: Secondary | ICD-10-CM

## 2017-07-26 DIAGNOSIS — F1024 Alcohol dependence with alcohol-induced mood disorder: Secondary | ICD-10-CM | POA: Diagnosis present

## 2017-07-26 DIAGNOSIS — F1420 Cocaine dependence, uncomplicated: Secondary | ICD-10-CM | POA: Insufficient documentation

## 2017-07-26 DIAGNOSIS — I10 Essential (primary) hypertension: Secondary | ICD-10-CM | POA: Insufficient documentation

## 2017-07-26 DIAGNOSIS — F333 Major depressive disorder, recurrent, severe with psychotic symptoms: Secondary | ICD-10-CM | POA: Diagnosis present

## 2017-07-26 DIAGNOSIS — F4310 Post-traumatic stress disorder, unspecified: Secondary | ICD-10-CM | POA: Diagnosis present

## 2017-07-26 DIAGNOSIS — F1094 Alcohol use, unspecified with alcohol-induced mood disorder: Secondary | ICD-10-CM | POA: Diagnosis present

## 2017-07-26 DIAGNOSIS — F10932 Alcohol use, unspecified with withdrawal with perceptual disturbance: Secondary | ICD-10-CM

## 2017-07-26 DIAGNOSIS — F1994 Other psychoactive substance use, unspecified with psychoactive substance-induced mood disorder: Secondary | ICD-10-CM | POA: Diagnosis present

## 2017-07-26 DIAGNOSIS — Z79899 Other long term (current) drug therapy: Secondary | ICD-10-CM | POA: Insufficient documentation

## 2017-07-26 DIAGNOSIS — F10232 Alcohol dependence with withdrawal with perceptual disturbance: Secondary | ICD-10-CM | POA: Insufficient documentation

## 2017-07-26 DIAGNOSIS — R45851 Suicidal ideations: Secondary | ICD-10-CM | POA: Insufficient documentation

## 2017-07-26 DIAGNOSIS — F1910 Other psychoactive substance abuse, uncomplicated: Secondary | ICD-10-CM

## 2017-07-26 DIAGNOSIS — I2510 Atherosclerotic heart disease of native coronary artery without angina pectoris: Secondary | ICD-10-CM | POA: Insufficient documentation

## 2017-07-26 LAB — COMPREHENSIVE METABOLIC PANEL
ALBUMIN: 3.8 g/dL (ref 3.5–5.0)
ALT: 51 U/L (ref 17–63)
AST: 125 U/L — ABNORMAL HIGH (ref 15–41)
Alkaline Phosphatase: 61 U/L (ref 38–126)
Anion gap: 12 (ref 5–15)
BILIRUBIN TOTAL: 1.2 mg/dL (ref 0.3–1.2)
BUN: 9 mg/dL (ref 6–20)
CO2: 24 mmol/L (ref 22–32)
CREATININE: 0.68 mg/dL (ref 0.61–1.24)
Calcium: 8.8 mg/dL — ABNORMAL LOW (ref 8.9–10.3)
Chloride: 96 mmol/L — ABNORMAL LOW (ref 101–111)
GFR calc Af Amer: >60 mL/min (ref >60)
GLUCOSE: 84 mg/dL (ref 65–99)
Potassium: 4 mmol/L (ref 3.5–5.1)
Sodium: 132 mmol/L — ABNORMAL LOW (ref 135–145)
TOTAL PROTEIN: 7.6 g/dL (ref 6.5–8.1)

## 2017-07-26 LAB — RAPID URINE DRUG SCREEN, HOSP PERFORMED
Amphetamines: NOT DETECTED
BARBITURATES: NOT DETECTED
Benzodiazepines: NOT DETECTED
COCAINE: POSITIVE — AB
OPIATES: POSITIVE — AB
TETRAHYDROCANNABINOL: NOT DETECTED

## 2017-07-26 LAB — CBC
HEMATOCRIT: 38.2 % — AB (ref 39.0–52.0)
Hemoglobin: 13.4 g/dL (ref 13.0–17.0)
MCH: 29.5 pg (ref 26.0–34.0)
MCHC: 35.1 g/dL (ref 30.0–36.0)
MCV: 84 fL (ref 78.0–100.0)
Platelets: 113 10*3/uL — ABNORMAL LOW (ref 150–400)
RBC: 4.55 MIL/uL (ref 4.22–5.81)
RDW: 14.7 % (ref 11.5–15.5)
WBC: 6 10*3/uL (ref 4.0–10.5)

## 2017-07-26 LAB — ETHANOL

## 2017-07-26 MED ORDER — LORAZEPAM 2 MG/ML IJ SOLN: 0.0000 mg | Freq: Four times a day (QID) | INTRAMUSCULAR | Status: DC

## 2017-07-26 MED ORDER — LORAZEPAM 1 MG PO TABS: 0.0000 mg | ORAL_TABLET | Freq: Two times a day (BID) | ORAL | Status: DC

## 2017-07-26 MED ORDER — LORAZEPAM 2 MG/ML IJ SOLN: 0.0000 mg | Freq: Two times a day (BID) | INTRAMUSCULAR | Status: DC

## 2017-07-26 MED ORDER — IBUPROFEN 200 MG PO TABS
400.0000 mg | ORAL_TABLET | Freq: Four times a day (QID) | ORAL | Status: DC | PRN
  Administered 2017-07-27 – 2017-07-28 (×5): 400 mg via ORAL
  Filled 2017-07-26 (×5): qty 2

## 2017-07-26 MED ORDER — DIPHENHYDRAMINE HCL 25 MG PO CAPS
25.0000 mg | ORAL_CAPSULE | Freq: Four times a day (QID) | ORAL | Status: DC | PRN
  Administered 2017-07-26 – 2017-07-28 (×3): 25 mg via ORAL
  Filled 2017-07-26 (×3): qty 1

## 2017-07-26 MED ORDER — AMLODIPINE BESYLATE 5 MG PO TABS
5.0000 mg | ORAL_TABLET | Freq: Every day | ORAL | Status: DC
  Administered 2017-07-26 – 2017-07-28 (×3): 5 mg via ORAL
  Filled 2017-07-26 (×4): qty 1

## 2017-07-26 MED ORDER — LORAZEPAM 1 MG PO TABS
0.0000 mg | ORAL_TABLET | Freq: Four times a day (QID) | ORAL | Status: DC
  Administered 2017-07-26 – 2017-07-28 (×5): 1 mg via ORAL
  Filled 2017-07-26 (×4): qty 1
  Filled 2017-07-26 (×2): qty 2

## 2017-07-26 MED ORDER — THIAMINE HCL 100 MG/ML IJ SOLN: 100.0000 mg | Freq: Every day | INTRAMUSCULAR | Status: DC

## 2017-07-26 MED ORDER — BUSPIRONE HCL 10 MG PO TABS
10.0000 mg | ORAL_TABLET | Freq: Two times a day (BID) | ORAL | Status: DC
  Administered 2017-07-27 – 2017-07-28 (×4): 10 mg via ORAL
  Filled 2017-07-26 (×4): qty 1

## 2017-07-26 MED ORDER — DIVALPROEX SODIUM 500 MG PO DR TAB
500.0000 mg | DELAYED_RELEASE_TABLET | Freq: Two times a day (BID) | ORAL | Status: DC
  Administered 2017-07-27 – 2017-07-28 (×4): 500 mg via ORAL
  Filled 2017-07-26 (×4): qty 1

## 2017-07-26 MED ORDER — TRAZODONE HCL 100 MG PO TABS
100.0000 mg | ORAL_TABLET | Freq: Every evening | ORAL | Status: DC | PRN
  Administered 2017-07-27 (×2): 100 mg via ORAL
  Filled 2017-07-26 (×2): qty 1

## 2017-07-26 MED ORDER — VITAMIN B-1 100 MG PO TABS
100.0000 mg | ORAL_TABLET | Freq: Every day | ORAL | Status: DC
  Administered 2017-07-26 – 2017-07-28 (×3): 100 mg via ORAL
  Filled 2017-07-26 (×3): qty 1

## 2017-07-26 MED ORDER — ATORVASTATIN CALCIUM 20 MG PO TABS
20.0000 mg | ORAL_TABLET | Freq: Every day | ORAL | Status: DC
  Administered 2017-07-27: 20 mg via ORAL
  Filled 2017-07-26 (×2): qty 1

## 2017-07-26 NOTE — ED Notes (Signed)
Patient given scrubs and personal belongings bag to change out.

## 2017-07-26 NOTE — ED Notes (Signed)
Patient states that doctor was just seeing him and that thinks the pain medication is what is breaking patient out. Patient wanting something different for pain. Informed patient that once MD puts in order for pain medication will bring it in to him. Offered dinner tray to patient. Patient only took lemonade.

## 2017-07-26 NOTE — ED Triage Notes (Signed)
Patient c/o SI with plan on OD on pills. Patient reports that he drinks 2-3 bottles of wine on daily basis and needs detox from it.  Patient reports that his last drink was this am. Patient has rash on bilat arms and itching.
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
## 2017-07-26 NOTE — ED Notes (Signed)
Bed: WA27 Expected date:  Expected time:  Means of arrival:  Comments: [place]

## 2017-07-26 NOTE — ED Notes (Signed)
Psych in with patient in triage.

## 2017-07-26 NOTE — ED Notes (Signed)
Bed: WLPT3 Expected date:  Expected time:  Means of arrival:  Comments: 

## 2017-07-27 DIAGNOSIS — Z818 Family history of other mental and behavioral disorders: Secondary | ICD-10-CM

## 2017-07-27 DIAGNOSIS — F10988 Alcohol use, unspecified with other alcohol-induced disorder: Secondary | ICD-10-CM

## 2017-07-27 DIAGNOSIS — R4587 Impulsiveness: Secondary | ICD-10-CM

## 2017-07-27 DIAGNOSIS — R45851 Suicidal ideations: Secondary | ICD-10-CM

## 2017-07-27 DIAGNOSIS — F419 Anxiety disorder, unspecified: Secondary | ICD-10-CM

## 2017-07-27 DIAGNOSIS — Z560 Unemployment, unspecified: Secondary | ICD-10-CM

## 2017-07-27 DIAGNOSIS — F1014 Alcohol abuse with alcohol-induced mood disorder: Secondary | ICD-10-CM

## 2017-07-27 DIAGNOSIS — F1994 Other psychoactive substance use, unspecified with psychoactive substance-induced mood disorder: Secondary | ICD-10-CM

## 2017-07-27 DIAGNOSIS — F1410 Cocaine abuse, uncomplicated: Secondary | ICD-10-CM

## 2017-07-27 NOTE — Patient Outreach (Signed)
ED Peer Support Specialist Patient Intake (Complete at intake & 30-60 Day Follow-up)  Name: [name]  MRN: [id number]  Age: 37 y.o.   Date of Admission: 07/27/2017  Intake: Initial Comments:      Primary Reason Admitted: depression, anxiety, SI, poly substance use with alcohol and cocaine   Lab values: Alcohol/ETOH: Positive Positive UDS? Yes Amphetamines: No Barbiturates: No Benzodiazepines: No Cocaine: Yes Opiates: Yes Cannabinoids: No  Demographic information: Gender: Identifies as male Ethnicity: African American Marital Status: Single Insurance Status: Uninsured/Self-pay Ecologist (Work Neurosurgeon, Physicist, medical, etc.: Yes(Food stamps) Lives with: Parent Living situation: House/Apartment  Reported Patient History: Patient reported health conditions: Bipolar disorder, Schizophrenia, Depression(Anxiety) Patient aware of HIV and hepatitis status: Yes (comment)(Negative)  In past year, has patient visited ED for any reason? No  Number of ED visits:    Reason(s) for visit:    In past year, has patient been hospitalized for any reason? No  Number of hospitalizations:    Reason(s) for hospitalization:    In past year, has patient been arrested? Yes  Number of arrests: 1  Reason(s) for arrest: larceny  In past year, has patient been incarcerated? Yes  Number of incarcerations: 1  Reason(s) for incarceration: larceny  In past year, has patient received medication-assisted treatment? No  In past year, patient received the following treatments: Other (comment)([facility] for mental health medications)  In past year, has patient received any harm reduction services? No  Did this include any of the following?    In past year, has patient received care from a mental health provider for diagnosis other than SUD? Yes  In past year, is this first time patient has overdosed? (has not overdosed)  Number of past overdoses:    In past  year, is this first time patient has been hospitalized for an overdose? (has not overdosed)  Number of hospitalizations for overdose(s):    Is patient currently receiving treatment for a mental health diagnosis? Yes(Patient is recie[facility] for depression, anxiety, Bipolar 1 disorder, and schizophrenia. )  Patient reports experiencing difficulty participating in SUD treatment: No    Most important reason(s) for this difficulty?    Has patient received prior services for treatment? Yes  In past, patient has received services from following agencies:    Plan of Care:  Suggested follow up at these agencies/treatment centers: (Patient is going the 300 hall at [facility] inpatient. Patient is interested in recieveing substance use treatment. CPSS will follow up with the patient at [facility] to help find placement for treatment. )  Other information: CPSS provided CPSS contact information and encouraged the patient to contact CPSS at anytime for substance use recovery support.    Mason Jim, CPSS  07/27/2017 3:17 PM
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
## 2017-07-27 NOTE — BH Assessment (Addendum)
[facility] Assessment Progress Note  Per Buford Dresser, DO, this pt requires psychiatric hospitalization at this time.  Leonia Reader, RN, [facility] has assigned pt to [facility] Rm 302-2; she will call when [facility] is ready to receive pt.  This Probation officer has informed Dimple Nanas that pt identifies as male-to-male transgender, but continues to go by the name "Kaoru."  I have also informed Linsey that pt has a collar and a sling on her left shoulder.  Pt has signed Voluntary Admission and Consent for Treatment, as well as Consent to Release Information to her parents and to [place], and a notification call has been placed to the latter.  Signed forms have been faxed to [facility].  Pt's nurse, Loma Sousa, has been notified, and agrees to send original paperwork along with pt via [facility]y [place]ries, and to call report to [phone].  Jalene Mullet, [place] Coordinator [phone]

## 2017-07-27 NOTE — Progress Notes (Signed)
During shift report, AC Linsey, RN stated the pt is no longer able to come to [facility] for admission. TTS to seek placement. TCU nurse made aware the pt is no longer accepted to [facility] for admission.   Lind Covert, MSW, LCSW Therapeutic Triage Specialist  [phone]

## 2017-07-28 ENCOUNTER — Encounter (HOSPITAL_COMMUNITY): Payer: Self-pay | Admitting: *Deleted

## 2017-07-28 ENCOUNTER — Other Ambulatory Visit: Payer: Self-pay

## 2017-07-28 ENCOUNTER — Inpatient Hospital Stay (HOSPITAL_COMMUNITY)
Admission: AD | Admit: 2017-07-28 | Discharge: 2017-08-03 | DRG: 885 | Disposition: A | Discharge status: Home / Self Care | Payer: Federal, State, Local not specified - Other | Source: Intra-hospital | Attending: Psychiatry | Admitting: Psychiatry

## 2017-07-28 DIAGNOSIS — E7800 Pure hypercholesterolemia, unspecified: Secondary | ICD-10-CM | POA: Diagnosis present

## 2017-07-28 DIAGNOSIS — Z885 Allergy status to narcotic agent status: Secondary | ICD-10-CM | POA: Diagnosis not present

## 2017-07-28 DIAGNOSIS — I10 Essential (primary) hypertension: Secondary | ICD-10-CM | POA: Diagnosis present

## 2017-07-28 DIAGNOSIS — Z8249 Family history of ischemic heart disease and other diseases of the circulatory system: Secondary | ICD-10-CM | POA: Diagnosis not present

## 2017-07-28 DIAGNOSIS — G4700 Insomnia, unspecified: Secondary | ICD-10-CM | POA: Diagnosis present

## 2017-07-28 DIAGNOSIS — M2550 Pain in unspecified joint: Secondary | ICD-10-CM | POA: Diagnosis not present

## 2017-07-28 DIAGNOSIS — Z905 Acquired absence of kidney: Secondary | ICD-10-CM

## 2017-07-28 DIAGNOSIS — Z9149 Other personal history of psychological trauma, not elsewhere classified: Secondary | ICD-10-CM

## 2017-07-28 DIAGNOSIS — F064 Anxiety disorder due to known physiological condition: Secondary | ICD-10-CM | POA: Diagnosis present

## 2017-07-28 DIAGNOSIS — F209 Schizophrenia, unspecified: Secondary | ICD-10-CM | POA: Diagnosis present

## 2017-07-28 DIAGNOSIS — R450 Nervousness: Secondary | ICD-10-CM | POA: Diagnosis not present

## 2017-07-28 DIAGNOSIS — Z560 Unemployment, unspecified: Secondary | ICD-10-CM | POA: Diagnosis not present

## 2017-07-28 DIAGNOSIS — Z915 Personal history of self-harm: Secondary | ICD-10-CM

## 2017-07-28 DIAGNOSIS — F332 Major depressive disorder, recurrent severe without psychotic features: Secondary | ICD-10-CM

## 2017-07-28 DIAGNOSIS — M542 Cervicalgia: Secondary | ICD-10-CM | POA: Diagnosis not present

## 2017-07-28 DIAGNOSIS — Z79899 Other long term (current) drug therapy: Secondary | ICD-10-CM | POA: Diagnosis not present

## 2017-07-28 DIAGNOSIS — F1110 Opioid abuse, uncomplicated: Secondary | ICD-10-CM | POA: Diagnosis not present

## 2017-07-28 DIAGNOSIS — I2510 Atherosclerotic heart disease of native coronary artery without angina pectoris: Secondary | ICD-10-CM | POA: Diagnosis present

## 2017-07-28 DIAGNOSIS — Z91018 Allergy to other foods: Secondary | ICD-10-CM

## 2017-07-28 DIAGNOSIS — Z85528 Personal history of other malignant neoplasm of kidney: Secondary | ICD-10-CM

## 2017-07-28 DIAGNOSIS — F1410 Cocaine abuse, uncomplicated: Secondary | ICD-10-CM | POA: Diagnosis present

## 2017-07-28 DIAGNOSIS — F1010 Alcohol abuse, uncomplicated: Secondary | ICD-10-CM | POA: Diagnosis not present

## 2017-07-28 DIAGNOSIS — Z853 Personal history of malignant neoplasm of breast: Secondary | ICD-10-CM

## 2017-07-28 DIAGNOSIS — F419 Anxiety disorder, unspecified: Secondary | ICD-10-CM | POA: Diagnosis not present

## 2017-07-28 DIAGNOSIS — F1020 Alcohol dependence, uncomplicated: Secondary | ICD-10-CM

## 2017-07-28 DIAGNOSIS — R45851 Suicidal ideations: Secondary | ICD-10-CM | POA: Diagnosis not present

## 2017-07-28 DIAGNOSIS — Z880 Allergy status to penicillin: Secondary | ICD-10-CM

## 2017-07-28 MED ORDER — LORAZEPAM 1 MG PO TABS
1.0000 mg | ORAL_TABLET | Freq: Four times a day (QID) | ORAL | Status: AC
  Administered 2017-07-28 – 2017-07-30 (×6): 1 mg via ORAL
  Filled 2017-07-28 (×6): qty 1

## 2017-07-28 MED ORDER — LORAZEPAM 1 MG PO TABS: 0.0000 mg | ORAL_TABLET | Freq: Four times a day (QID) | ORAL | Status: DC

## 2017-07-28 MED ORDER — THIAMINE HCL 100 MG/ML IJ SOLN
100.0000 mg | Freq: Every day | INTRAMUSCULAR | Status: DC
Start: 2017-07-28 — End: 2017-08-03

## 2017-07-28 MED ORDER — TRAZODONE HCL 100 MG PO TABS
200.0000 mg | ORAL_TABLET | Freq: Every evening | ORAL | Status: DC | PRN
  Administered 2017-07-28: 200 mg via ORAL
  Filled 2017-07-28: qty 2

## 2017-07-28 MED ORDER — ONDANSETRON 4 MG PO TBDP: 4.0000 mg | ORAL_TABLET | Freq: Four times a day (QID) | ORAL | Status: AC | PRN

## 2017-07-28 MED ORDER — LORAZEPAM 1 MG PO TABS: 1.0000 mg | ORAL_TABLET | Freq: Four times a day (QID) | ORAL | Status: AC | PRN

## 2017-07-28 MED ORDER — TRAMADOL HCL 50 MG PO TABS: 100.0000 mg | ORAL_TABLET | Freq: Four times a day (QID) | ORAL | Status: DC | PRN

## 2017-07-28 MED ORDER — IBUPROFEN 800 MG PO TABS: 800.0000 mg | ORAL_TABLET | Freq: Four times a day (QID) | ORAL | Status: DC | PRN

## 2017-07-28 MED ORDER — IBUPROFEN 800 MG PO TABS
800.0000 mg | ORAL_TABLET | Freq: Four times a day (QID) | ORAL | Status: DC | PRN
  Administered 2017-07-29 – 2017-08-03 (×8): 800 mg via ORAL
  Filled 2017-07-28 (×8): qty 1

## 2017-07-28 MED ORDER — ATORVASTATIN CALCIUM 20 MG PO TABS
20.0000 mg | ORAL_TABLET | Freq: Every day | ORAL | Status: DC
  Administered 2017-07-29 – 2017-08-02 (×4): 20 mg via ORAL
  Filled 2017-07-28 (×5): qty 1
  Filled 2017-07-28: qty 7
  Filled 2017-07-28: qty 1

## 2017-07-28 MED ORDER — LORAZEPAM 1 MG PO TABS: 0.0000 mg | ORAL_TABLET | Freq: Two times a day (BID) | ORAL | Status: DC

## 2017-07-28 MED ORDER — LORAZEPAM 2 MG/ML IJ SOLN: 0.0000 mg | Freq: Two times a day (BID) | INTRAMUSCULAR | Status: DC

## 2017-07-28 MED ORDER — HYDROXYZINE HCL 25 MG PO TABS
25.0000 mg | ORAL_TABLET | Freq: Three times a day (TID) | ORAL | Status: DC | PRN
  Administered 2017-07-29 – 2017-08-01 (×3): 25 mg via ORAL
  Filled 2017-07-28: qty 10
  Filled 2017-07-28: qty 1
  Filled 2017-07-28: qty 3
  Filled 2017-07-28: qty 1

## 2017-07-28 MED ORDER — AMLODIPINE BESYLATE 5 MG PO TABS: 5.0000 mg | ORAL_TABLET | Freq: Every day | ORAL | Status: DC

## 2017-07-28 MED ORDER — ZIPRASIDONE HCL 40 MG PO CAPS
40.0000 mg | ORAL_CAPSULE | Freq: Two times a day (BID) | ORAL | Status: DC
  Administered 2017-07-29: 40 mg via ORAL
  Filled 2017-07-28 (×5): qty 1

## 2017-07-28 MED ORDER — DIPHENHYDRAMINE HCL 25 MG PO CAPS: 25.0000 mg | ORAL_CAPSULE | Freq: Four times a day (QID) | ORAL | Status: DC | PRN

## 2017-07-28 MED ORDER — ALUM & MAG HYDROXIDE-SIMETH 200-200-20 MG/5ML PO SUSP: 30.0000 mL | ORAL | Status: DC | PRN

## 2017-07-28 MED ORDER — LORAZEPAM 2 MG/ML IJ SOLN: 0.0000 mg | Freq: Four times a day (QID) | INTRAMUSCULAR | Status: DC

## 2017-07-28 MED ORDER — CYCLOBENZAPRINE HCL 10 MG PO TABS
10.0000 mg | ORAL_TABLET | Freq: Three times a day (TID) | ORAL | Status: DC
  Administered 2017-07-28: 10 mg via ORAL
  Filled 2017-07-28: qty 1

## 2017-07-28 MED ORDER — LOPERAMIDE HCL 2 MG PO CAPS: 2.0000 mg | ORAL_CAPSULE | ORAL | Status: AC | PRN

## 2017-07-28 MED ORDER — DIVALPROEX SODIUM 500 MG PO DR TAB: 500.0000 mg | DELAYED_RELEASE_TABLET | Freq: Two times a day (BID) | ORAL | Status: DC

## 2017-07-28 MED ORDER — TRAMADOL HCL 50 MG PO TABS
100.0000 mg | ORAL_TABLET | Freq: Four times a day (QID) | ORAL | Status: DC
  Administered 2017-07-28 – 2017-07-29 (×3): 100 mg via ORAL
  Filled 2017-07-28 (×3): qty 2

## 2017-07-28 MED ORDER — TRAMADOL HCL 50 MG PO TABS
50.0000 mg | ORAL_TABLET | Freq: Once | ORAL | Status: AC
  Administered 2017-07-28: 50 mg via ORAL
  Filled 2017-07-28: qty 1

## 2017-07-28 MED ORDER — DIVALPROEX SODIUM 500 MG PO DR TAB
500.0000 mg | DELAYED_RELEASE_TABLET | Freq: Two times a day (BID) | ORAL | Status: DC
  Administered 2017-07-28 – 2017-07-29 (×2): 500 mg via ORAL
  Filled 2017-07-28 (×7): qty 1

## 2017-07-28 MED ORDER — TRAZODONE HCL 100 MG PO TABS: 100.0000 mg | ORAL_TABLET | Freq: Every evening | ORAL | Status: DC | PRN

## 2017-07-28 MED ORDER — MAGNESIUM HYDROXIDE 400 MG/5ML PO SUSP: 30.0000 mL | Freq: Every day | ORAL | Status: DC | PRN

## 2017-07-28 MED ORDER — VITAMIN B-1 100 MG PO TABS
100.0000 mg | ORAL_TABLET | Freq: Every day | ORAL | Status: DC
  Administered 2017-07-28 – 2017-08-03 (×7): 100 mg via ORAL
  Filled 2017-07-28 (×12): qty 1

## 2017-07-28 MED ORDER — LORAZEPAM 1 MG PO TABS
1.0000 mg | ORAL_TABLET | Freq: Every day | ORAL | Status: AC
  Administered 2017-08-02: 1 mg via ORAL
  Filled 2017-07-28: qty 1

## 2017-07-28 MED ORDER — CYCLOBENZAPRINE HCL 10 MG PO TABS
10.0000 mg | ORAL_TABLET | Freq: Three times a day (TID) | ORAL | Status: DC
  Administered 2017-07-29 (×2): 10 mg via ORAL
  Filled 2017-07-28 (×7): qty 1

## 2017-07-28 MED ORDER — ATORVASTATIN CALCIUM 20 MG PO TABS: 20.0000 mg | ORAL_TABLET | Freq: Every day | ORAL | Status: DC

## 2017-07-28 MED ORDER — LORAZEPAM 1 MG PO TABS
1.0000 mg | ORAL_TABLET | Freq: Three times a day (TID) | ORAL | Status: AC
  Administered 2017-07-30 – 2017-07-31 (×3): 1 mg via ORAL
  Filled 2017-07-28 (×2): qty 1

## 2017-07-28 MED ORDER — TRAZODONE HCL 50 MG PO TABS: 50.0000 mg | ORAL_TABLET | Freq: Every evening | ORAL | Status: DC | PRN

## 2017-07-28 MED ORDER — BUSPIRONE HCL 10 MG PO TABS
10.0000 mg | ORAL_TABLET | Freq: Two times a day (BID) | ORAL | Status: DC
  Administered 2017-07-28 – 2017-07-29 (×2): 10 mg via ORAL
  Filled 2017-07-28: qty 1
  Filled 2017-07-28: qty 2
  Filled 2017-07-28 (×5): qty 1

## 2017-07-28 MED ORDER — AMLODIPINE BESYLATE 5 MG PO TABS
5.0000 mg | ORAL_TABLET | Freq: Every day | ORAL | Status: DC
  Administered 2017-07-29 – 2017-08-03 (×6): 5 mg via ORAL
  Filled 2017-07-28 (×9): qty 1
  Filled 2017-07-28: qty 7

## 2017-07-28 MED ORDER — TRAMADOL HCL 50 MG PO TABS
100.0000 mg | ORAL_TABLET | Freq: Four times a day (QID) | ORAL | Status: DC
  Administered 2017-07-28: 100 mg via ORAL
  Filled 2017-07-28: qty 2

## 2017-07-28 MED ORDER — LORAZEPAM 1 MG PO TABS
1.0000 mg | ORAL_TABLET | Freq: Two times a day (BID) | ORAL | Status: AC
  Administered 2017-07-31 – 2017-08-01 (×2): 1 mg via ORAL
  Filled 2017-07-28 (×2): qty 1

## 2017-07-28 NOTE — ED Notes (Signed)
Patient transported to [facility] bed 1 by [facility]. Patient voluntary forms transported with [facility]. Patient transported in c-collar and arm sling.

## 2017-07-28 NOTE — ED Provider Notes (Signed)
I was contacted by the behavioral health staff. They can accept Mr.Tubby for inpatient care. They request change of his sling to shoulder immobilizer. I reviewed his imaging. He has a stable lateral mass C7 fracture and stable scapular body fracture. Was given some tramadol. Will be changed from sling to shoulder immobilizer. He will maintain his aspirin collar until discharge and follow-up with neurosurgery. He continues to complain of some discomfort in his neck and shoulder. No extremity complaints. Normal neuro exam. Pleasant and cooperative and interactive.   Tanna Furry, MD 07/28/17 1030

## 2017-07-28 NOTE — Tx Team (Signed)
Initial Treatment Plan 07/28/2017 7:03 PM [name]:[id number]    PATIENT STRESSORS: Educational concerns Substance abuse   PATIENT STRENGTHS: Ability for insight Average or above average intelligence Communication skills General fund of knowledge   PATIENT IDENTIFIED PROBLEMS: Feeling like a failure  "not completing anything"  Substance abuse                 DISCHARGE CRITERIA:  Ability to meet basic life and health needs Adequate post-discharge living arrangements Improved stabilization in mood, thinking, and/or behavior Medical problems require only outpatient monitoring Motivation to continue treatment in a less acute level of care Need for constant or close observation no longer present Reduction of life-threatening or endangering symptoms to within safe limits Safe-care adequate arrangements made Verbal commitment to aftercare and medication compliance Withdrawal symptoms are absent or subacute and managed without 24-hour nursing intervention  PRELIMINARY DISCHARGE PLAN: Outpatient therapy Return to previous living arrangement  PATIENT/FAMILY INVOLVEMENT: This treatment plan has been presented to and reviewed with the patient, [name], and/or family member, .  The patient and family have been given the opportunity to ask questions and make suggestions.  Mosie Lukes, RN 07/28/2017, 7:03 PM

## 2017-07-28 NOTE — ED Notes (Signed)
[facility] called for transport to [facility]

## 2017-07-28 NOTE — BH Assessment (Signed)
[place] Assessment Progress Note  Per Leonia Reader, RN, [facility] pt has been re-assigned to [facility] Rm 306-1.  They will be ready to receive pt at 15:00.  Pt's nurse has been notified.  Jalene Mullet, [place] Coordinator [phone]
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
## 2017-07-28 NOTE — ED Notes (Signed)
Called to [facility] to give report but the receiving nurse is on a lunch break and is not able to get report until she returns around 3:30pm.

## 2017-07-29 DIAGNOSIS — F1010 Alcohol abuse, uncomplicated: Secondary | ICD-10-CM

## 2017-07-29 DIAGNOSIS — F1410 Cocaine abuse, uncomplicated: Secondary | ICD-10-CM

## 2017-07-29 DIAGNOSIS — Z9149 Other personal history of psychological trauma, not elsewhere classified: Secondary | ICD-10-CM

## 2017-07-29 DIAGNOSIS — R45851 Suicidal ideations: Secondary | ICD-10-CM

## 2017-07-29 DIAGNOSIS — G4700 Insomnia, unspecified: Secondary | ICD-10-CM

## 2017-07-29 DIAGNOSIS — F332 Major depressive disorder, recurrent severe without psychotic features: Principal | ICD-10-CM

## 2017-07-29 DIAGNOSIS — Z560 Unemployment, unspecified: Secondary | ICD-10-CM

## 2017-07-29 LAB — HEMOGLOBIN A1C
Hgb A1c MFr Bld: 5.2 % (ref 4.8–5.6)
Mean Plasma Glucose: 102.54 mg/dL

## 2017-07-29 LAB — LIPID PANEL
CHOL/HDL RATIO: 3.4 ratio
Cholesterol: 216 mg/dL — ABNORMAL HIGH (ref 0–200)
HDL: 64 mg/dL (ref >40)
LDL Cholesterol: 135 mg/dL — ABNORMAL HIGH (ref 0–99)
Triglycerides: 85 mg/dL (ref <150)
VLDL: 17 mg/dL (ref 0–40)

## 2017-07-29 LAB — TSH: TSH: 1.445 u[IU]/mL (ref 0.350–4.500)

## 2017-07-29 MED ORDER — ZIPRASIDONE HCL 20 MG PO CAPS
20.0000 mg | ORAL_CAPSULE | Freq: Two times a day (BID) | ORAL | Status: DC
  Administered 2017-07-29 – 2017-08-03 (×9): 20 mg via ORAL
  Filled 2017-07-29 (×3): qty 1
  Filled 2017-07-29: qty 14
  Filled 2017-07-29 (×6): qty 1
  Filled 2017-07-29: qty 14
  Filled 2017-07-29 (×5): qty 1

## 2017-07-29 MED ORDER — TRAZODONE HCL 50 MG PO TABS
50.0000 mg | ORAL_TABLET | Freq: Every evening | ORAL | Status: DC | PRN
  Administered 2017-07-29 – 2017-07-30 (×2): 50 mg via ORAL
  Filled 2017-07-29: qty 1

## 2017-07-29 MED ORDER — TRAMADOL HCL 50 MG PO TABS
50.0000 mg | ORAL_TABLET | Freq: Four times a day (QID) | ORAL | Status: DC | PRN
  Administered 2017-07-29 – 2017-08-03 (×12): 50 mg via ORAL
  Filled 2017-07-29 (×12): qty 1

## 2017-07-29 MED ORDER — CYCLOBENZAPRINE HCL 10 MG PO TABS
10.0000 mg | ORAL_TABLET | Freq: Three times a day (TID) | ORAL | Status: DC | PRN
Start: 2017-07-29 — End: 2017-08-03
  Administered 2017-07-29 – 2017-08-03 (×6): 10 mg via ORAL
  Filled 2017-07-29 (×5): qty 1

## 2017-07-29 MED ORDER — DIVALPROEX SODIUM 250 MG PO DR TAB
250.0000 mg | DELAYED_RELEASE_TABLET | Freq: Two times a day (BID) | ORAL | Status: DC
  Administered 2017-07-29 – 2017-08-03 (×9): 250 mg via ORAL
  Filled 2017-07-29 (×9): qty 1
  Filled 2017-07-29: qty 14
  Filled 2017-07-29 (×2): qty 1
  Filled 2017-07-29: qty 14
  Filled 2017-07-29 (×3): qty 1

## 2017-07-29 NOTE — [facility] Suicide Risk Assessment (Signed)
[facility] INPATIENT:  Family/Significant Other Suicide Prevention Education  Suicide Prevention Education:  Patient Refusal for Family/Significant Other Suicide Prevention Education: The patient [name] has refused to provide written consent for family/significant other to be provided Family/Significant Other Suicide Prevention Education during admission and/or prior to discharge.  Physician notified.  Lilly Cove 07/29/2017, 10:27 AM

## 2017-07-29 NOTE — Plan of Care (Signed)
Patient verbalizes understanding of information, education provided. 
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
## 2017-07-29 NOTE — BHH Group Notes (Signed)
LCSW Group Therapy Note  07/29/2017 1:15pm  Type of Therapy/Topic:  Group Therapy:  Emotion Regulation  Participation Level:  Did Not Attend-Pt invited. Chose to remain in bed.    Description of Group:   The purpose of this group is to assist patients in learning to regulate negative emotions and experience positive emotions. Patients will be guided to discuss ways in which they have been vulnerable to their negative emotions. These vulnerabilities will be juxtaposed with experiences of positive emotions or situations, and patients will be challenged to use positive emotions to combat negative ones. Special emphasis will be placed on coping with negative emotions in conflict situations, and patients will process healthy conflict resolution skills.  Therapeutic Goals: 1. Patient will identify two positive emotions or experiences to reflect on in order to balance out negative emotions 2. Patient will label two or more emotions that they find the most difficult to experience 3. Patient will demonstrate positive conflict resolution skills through discussion and/or role plays  Summary of Patient Progress:  x   Therapeutic Modalities:   Cognitive Behavioral Therapy Feelings Identification Dialectical Behavioral Therapy   Kimber Relic Smart, LCSW 07/29/2017 2:50 PM

## 2017-07-29 NOTE — Tx Team (Signed)
Interdisciplinary Treatment and Diagnostic Plan Update  07/29/2017 Time of Session: [place] MRN: [id number]  Principal Diagnosis: MDD (major depressive disorder), recurrent severe, without psychosis ([place])  Secondary Diagnoses: Principal Problem:   MDD (major depressive disorder), recurrent severe, without psychosis ([place])   Current Medications:  Current Facility-Administered Medications  Medication Dose Route Frequency Provider Last Rate Last Dose  . alum & mag hydroxide-simeth (MAALOX/MYLANTA) 200-200-20 MG/5ML suspension 30 mL  30 mL Oral Q4H PRN Ethelene Hal, NP      . amLODipine (NORVASC) tablet 5 mg  5 mg Oral Daily Ethelene Hal, NP   5 mg at 07/29/17 0831  . atorvastatin (LIPITOR) tablet 20 mg  20 mg Oral q1800 Ethelene Hal, NP      . busPIRone (BUSPAR) tablet 10 mg  10 mg Oral BID Ethelene Hal, NP   10 mg at 07/29/17 0831  . cyclobenzaprine (FLEXERIL) tablet 10 mg  10 mg Oral TID [name], NP   10 mg at 07/29/17 0831  . diphenhydrAMINE (BENADRYL) capsule 25 mg  25 mg Oral Q6H PRN Ethelene Hal, NP      . divalproex (DEPAKOTE) DR tablet 500 mg  500 mg Oral BID Ethelene Hal, NP   500 mg at 07/29/17 0831  . hydrOXYzine (ATARAX/VISTARIL) tablet 25 mg  25 mg Oral TID PRN Ethelene Hal, NP      . ibuprofen (ADVIL,MOTRIN) tablet 800 mg  800 mg Oral Q6H PRN [name], NP      . loperamide (IMODIUM) capsule 2-4 mg  2-4 mg Oral PRN Lindon Romp A, NP      . LORazepam (ATIVAN) tablet 1 mg  1 mg Oral Q6H PRN Lindon Romp A, NP      . LORazepam (ATIVAN) tablet 1 mg  1 mg Oral QID Lindon Romp A, NP   1 mg at 07/29/17 7408   Followed by  . [START ON 07/30/2017] LORazepam (ATIVAN) tablet 1 mg  1 mg Oral TID [name], NP       Followed by  . [START ON 07/31/2017] LORazepam (ATIVAN) tablet 1 mg  1 mg Oral BID [name], NP       Followed by  . [START ON 08/02/2017] LORazepam (ATIVAN) tablet 1 mg  1 mg  Oral Daily Lindon Romp A, NP      . magnesium hydroxide (MILK OF MAGNESIA) suspension 30 mL  30 mL Oral Daily PRN Ethelene Hal, NP      . ondansetron (ZOFRAN-ODT) disintegrating tablet 4 mg  4 mg Oral Q6H PRN Lindon Romp A, NP      . thiamine (VITAMIN B-1) tablet 100 mg  100 mg Oral Daily Ethelene Hal, NP   100 mg at 07/29/17 0831   Or  . thiamine (B-1) injection 100 mg  100 mg Intravenous Daily Ethelene Hal, NP      . traMADol Veatrice Bourbon) tablet 100 mg  100 mg Oral Q6H [name], NP   100 mg at 07/29/17 1448  . traZODone (DESYREL) tablet 200 mg  200 mg Oral QHS PRN Nanci Pina, FNP   200 mg at 07/28/17 2109  . ziprasidone (GEODON) capsule 40 mg  40 mg Oral BID WC Nanci Pina, FNP   40 mg at 07/29/17 0831   PTA Medications: Medications Prior to Admission  Medication Sig Dispense Refill Last Dose  . amLODipine (NORVASC) 5 MG tablet Take 1 tablet (5  mg total) by mouth daily. 30 tablet 0 Past Week at Unknown time  . atorvastatin (LIPITOR) 20 MG tablet Take 1 tablet (20 mg total) by mouth daily at 6 PM. 30 tablet 0 Past Week at Unknown time  . diphenhydrAMINE (BENADRYL) 25 MG tablet Take 25 mg by mouth every 6 (six) hours as needed for itching.   Past Week at Unknown time  . divalproex (DEPAKOTE) 500 MG DR tablet Take 500 mg by mouth 2 (two) times daily.   Past Week at Unknown time  . [EXPIRED] HYDROmorphone (DILAUDID) 4 MG tablet Take 1 tablet (4 mg total) by mouth every 6 (six) hours as needed for up to 3 days for severe pain. 12 tablet 0 Past Week at Unknown time  . hydrOXYzine (ATARAX/VISTARIL) 25 MG tablet Take 1 tablet (25 mg total) by mouth every 6 (six) hours as needed for anxiety. 30 tablet 0 Past Week at Unknown time  . traZODone (DESYREL) 50 MG tablet Take 100 mg by mouth at bedtime as needed for sleep.    Past Week at Unknown time  . ziprasidone (GEODON) 40 MG capsule Take 40 mg by mouth 2 (two) times daily with a meal.   Past Week at Unknown time     Patient Stressors: Educational concerns Substance abuse  Patient Strengths: Ability for insight Average or above average intelligence Communication skills General fund of knowledge  Treatment Modalities: Medication Management, Group therapy, Case management,  1 to 1 session with clinician, Psychoeducation, Recreational therapy.   Physician Treatment Plan for Primary Diagnosis: MDD (major depressive disorder), recurrent severe, without psychosis ([place]) Long Term Goal(s): Improvement in symptoms so as ready for discharge Improvement in symptoms so as ready for discharge   Short Term Goals: Ability to disclose and discuss suicidal ideas Ability to demonstrate self-control will improve Ability to maintain clinical measurements within normal limits will improve Compliance with prescribed medications will improve Ability to identify changes in lifestyle to reduce recurrence of condition will improve Ability to identify and develop effective coping behaviors will improve Ability to identify triggers associated with substance abuse/mental health issues will improve  Medication Management: Evaluate patient's response, side effects, and tolerance of medication regimen.  Therapeutic Interventions: 1 to 1 sessions, Unit Group sessions and Medication administration.  Evaluation of Outcomes: Not Met  Physician Treatment Plan for Secondary Diagnosis: Principal Problem:   MDD (major depressive disorder), recurrent severe, without psychosis ([place])  Long Term Goal(s): Improvement in symptoms so as ready for discharge Improvement in symptoms so as ready for discharge   Short Term Goals: Ability to disclose and discuss suicidal ideas Ability to demonstrate self-control will improve Ability to maintain clinical measurements within normal limits will improve Compliance with prescribed medications will improve Ability to identify changes in lifestyle to reduce recurrence of condition will  improve Ability to identify and develop effective coping behaviors will improve Ability to identify triggers associated with substance abuse/mental health issues will improve     Medication Management: Evaluate patient's response, side effects, and tolerance of medication regimen.  Therapeutic Interventions: 1 to 1 sessions, Unit Group sessions and Medication administration.  Evaluation of Outcomes: Not Met   RN Treatment Plan for Primary Diagnosis: MDD (major depressive disorder), recurrent severe, without psychosis ([place]) Long Term Goal(s): Knowledge of disease and therapeutic regimen to maintain health will improve  Short Term Goals: Ability to remain free from injury will improve, Ability to participate in decision making will improve and Ability to disclose and discuss suicidal ideas  Medication Management: RN will administer medications as ordered by provider, will assess and evaluate patient's response and provide education to patient for prescribed medication. RN will report any adverse and/or side effects to prescribing provider.  Therapeutic Interventions: 1 on 1 counseling sessions, Psychoeducation, Medication administration, Evaluate responses to treatment, Monitor vital signs and CBGs as ordered, Perform/monitor CIWA, COWS, AIMS and Fall Risk screenings as ordered, Perform wound care treatments as ordered.  Evaluation of Outcomes: Progressing   LCSW Treatment Plan for Primary Diagnosis: MDD (major depressive disorder), recurrent severe, without psychosis ([place]) Long Term Goal(s): Safe transition to appropriate next level of care at discharge, Engage patient in therapeutic group addressing interpersonal concerns.  Short Term Goals: Engage patient in aftercare planning with referrals and resources, Facilitate patient progression through stages of change regarding substance use diagnoses and concerns and Identify triggers associated with mental health/substance abuse  issues  Therapeutic Interventions: Assess for all discharge needs, 1 to 1 time with Social worker, Explore available resources and support systems, Assess for adequacy in community support network, Educate family and significant other(s) on suicide prevention, Complete Psychosocial Assessment, Interpersonal group therapy.  Evaluation of Outcomes: Progressing   Progress in Treatment: Attending groups: No. New to unit. Continuing to assess.  Participating in groups: No. Taking medication as prescribed: Yes. Toleration medication: Yes. Family/Significant other contact made: No, will contact:  family member if patient consents to collateral contact.  Patient understands diagnosis: Yes. Discussing patient identified problems/goals with staff: Yes. Medical problems stabilized or resolved: Yes. Denies suicidal/homicidal ideation: Yes. Issues/concerns per patient self-inventory: No. Other: n/a   New problem(s) identified: No, Describe:  n/a  New Short Term/Long Term Goal(s): detox, medication management for mood stabilization; elimination of SI thoughts; development of comprehensive mental wellness/sobriety plan.   Discharge Plan or Barriers: CSW assessing for appropriate referrals. Pt has been going to [facility] for medication management. [place] pamphlet and AA/NA information also to be provided to pt for additional community support.   Reason for Continuation of Hospitalization: Anxiety Depression Medication stabilization Suicidal ideation Withdrawal symptoms  Estimated Length of Stay: Monday, 08/01/17  Attendees: Patient: 07/29/2017 10:16 AM  Physician: Dr. Parke Poisson MD; Dr. Nancy Fetter MD  07/29/2017 10:16 AM  Nursing: Chrys Racer RN; Jinny Sanders RN 07/29/2017 10:16 AM  RN Care Manager: Lars Pinks CM 07/29/2017 10:16 AM  Social Worker: Press photographer, LCSW 07/29/2017 10:16 AM  Recreational Therapist: x 07/29/2017 10:16 AM  Other: Lindell Spar NP; Roper Money NP 07/29/2017 10:16 AM  Other:  07/29/2017  10:16 AM  Other: 07/29/2017 10:16 AM    Scribe for Treatment Team: Kimber Relic Smart, LCSW 07/29/2017 10:16 AM
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
## 2017-07-29 NOTE — Progress Notes (Signed)
Recreation Therapy Notes  Date:  07/29/17 Time: 0930 Location: [address] Group Room  Group Topic: Stress Management  Goal Area(s) Addresses:  Patient will verbalize importance of using healthy stress management.  Patient will identify positive emotions associated with healthy stress management.  Intervention: Stress Management  Activity :  Progressive Muscle Relaxation.  LRT introduced the stress management technique of progressive muscle relaxation.  LRT read a script to guide patients through the technique which allowed them to tense and relax each muscle group individually.  Education: Stress Management, Discharge Planning.   Education Outcome: Acknowledges edcuation/In group clarification offered/Needs additional education  Clinical Observations/Feedback: Pt did not attend group.     Victorino Sparrow, LRT/CTRS          Victorino Sparrow A 07/29/2017 12:10 PM

## 2017-07-29 NOTE — Progress Notes (Signed)
Pt did not attend group. 
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
## 2017-07-30 DIAGNOSIS — M2550 Pain in unspecified joint: Secondary | ICD-10-CM

## 2017-07-30 DIAGNOSIS — M549 Dorsalgia, unspecified: Secondary | ICD-10-CM

## 2017-07-30 DIAGNOSIS — F39 Unspecified mood [affective] disorder: Secondary | ICD-10-CM

## 2017-07-30 DIAGNOSIS — F419 Anxiety disorder, unspecified: Secondary | ICD-10-CM

## 2017-07-30 DIAGNOSIS — R450 Nervousness: Secondary | ICD-10-CM

## 2017-07-30 DIAGNOSIS — F1910 Other psychoactive substance abuse, uncomplicated: Secondary | ICD-10-CM

## 2017-07-30 NOTE — [facility] Group Notes (Signed)
[facility] Group Notes: (Clinical Social Work)   07/30/2017      Type of Therapy:  Group Therapy   Participation Level:  Did Not Attend despite MHT prompting   Selmer Dominion, LCSW 07/30/2017, 12:38 PM
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
## 2017-07-30 NOTE — Progress Notes (Signed)
Psychoeducational Group Note  Date:  07/30/2017 Time:  2310  Group Topic/Focus:  Wrap-Up Group:   The focus of this group is to help patients review their daily goal of treatment and discuss progress on daily workbooks.  Participation Level: Did Not Attend  Participation Quality:  Not Applicable  Affect:  Not Applicable  Cognitive:  Not Applicable  Insight:  Not Applicable  Engagement in Group: Not Applicable  Additional Comments:  The patient did not attend group this evening since he was asleep in his bedroom.   Nickey Kloepfer S 07/30/2017, 11:10 PM

## 2017-07-30 NOTE — Progress Notes (Signed)
D Patient is seen ambulating down the 300 hall. He keeps his eyes pointed downward. He moves stiffly. Rigidly. HE cradles his left arm. He wears a cervical  Collar. He moves very slowly and with purpose. A HE says to this writer " I really feel awful;": He takes his scheduled meds as planned. A HE answers assessment questions as this writer asks him as follows: he rates his depression, hopelessness and anxeity " 7/7/7/", respecitvely and he says he has experienced SI today. When writer pressed him further, he agreed to contract with Probation officer not to hurt self. He is encouraged to get OOB UAL at least q 1 hr, for 5 min, to keep mobility in place. R Safety is in palce

## 2017-07-31 MED ORDER — TRAZODONE HCL 100 MG PO TABS: 100.0000 mg | ORAL_TABLET | Freq: Every evening | ORAL | Status: DC | PRN

## 2017-07-31 NOTE — B[facility] Group Notes (Signed)
[facility] Group Notes: (Clinical Social Work)   07/31/2017      Type of Therapy:  Group Therapy   Participation Level:  Did Not Attend despite MHT prompting   Selmer Dominion, LCSW 07/31/2017, 1:10 PM

## 2017-07-31 NOTE — Progress Notes (Signed)
Nursing Progress Note: 7p-7a D: Pt currently presents with a depressed/sad/pained affect and behavior. Pt states "I am in a lot of pain.  It really hurts to get up out of bed. I'm trying but the pain is really bad." Not interacting with the milieu. Pt reports poor sleep during the previous night with current medication regimen. Pt did not attend wrap-up group.  A: Pt provided with medications per providers orders. Pt's labs and vitals were monitored throughout the night. Pt supported emotionally and encouraged to express concerns and questions. Pt educated on medications.  R: Pt's safety ensured with 15 minute and environmental checks. Pt currently denies SI, HI, and AVH. Pt verbally contracts to seek staff if SI,HI, or AVH occurs and to consult with staff before acting on any harmful thoughts. Will continue to monitor.

## 2017-07-31 NOTE — B[facility] Group Notes (Signed)
RN EDucational  Date:  07/31/2017  Time:  1500  Type of Therapy:  Nurse Education  / The group focuses on teaching patients how to develop support systems and the value of developing and using them to [place] their mental health.  Participation Level:  Did Not Attend  Participation Quality:    Affect:    Cognitive:    Insight:    Engagement in Group:    Modes of Intervention:    Summary of Progress/Problems:  Lauralyn Primes 07/31/2017, 4:29 PM
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
## 2017-07-31 NOTE — Progress Notes (Addendum)
D Patient is observed lying in his bed. HE is quiet and still. He says his pain is " still there" and he rates it a " 10 out of 10". A He answered his assessment questions writer asked him and answers are as follows: he rates his depression, hopelessness and anxeity " 7/7/7/", respectively and he says he has experienced SI today but he contracts with Probation officer for safety . R POC cont'd.

## 2017-07-31 NOTE — Progress Notes (Signed)
Pt did not attend AA group this evening.  

## 2017-08-01 ENCOUNTER — Ambulatory Visit (INDEPENDENT_AMBULATORY_CARE_PROVIDER_SITE_OTHER): Payer: Self-pay | Admitting: Orthopaedic Surgery

## 2017-08-01 DIAGNOSIS — M542 Cervicalgia: Secondary | ICD-10-CM

## 2017-08-01 LAB — VALPROIC ACID LEVEL: VALPROIC ACID LVL: 54 ug/mL (ref 50.0–100.0)

## 2017-08-01 MED ORDER — TRAZODONE HCL 150 MG PO TABS
150.0000 mg | ORAL_TABLET | Freq: Every evening | ORAL | Status: DC | PRN
  Administered 2017-08-01 (×2): 150 mg via ORAL
  Filled 2017-08-01 (×9): qty 1

## 2017-08-01 NOTE — Progress Notes (Signed)
Recreation Therapy Notes  Date: 08/01/17 Time: 0930 Location: [address] Dayroom  Group Topic: Stress Management  Goal Area(s) Addresses:  Patient will verbalize importance of using healthy stress management.  Patient will identify positive emotions associated with healthy stress management.   Intervention: Stress Management  Activity :  [employer].  LRT introduced the stress management technique of guided imagery.  LRT read a script to allow patients to visualize being in a protected wildlife sanctuary.  Patients were to follow along as script was read to engage in activity.  Education:  Stress Management, Discharge Planning.   Education Outcome: Acknowledges edcuation/In group clarification offered/Needs additional education  Clinical Observations/Feedback: Pt did not attend group.     Victorino Sparrow, LRT/CTRS         Victorino Sparrow A 08/01/2017 12:50 PM

## 2017-08-01 NOTE — [facility] Group Notes (Signed)
LCSW Group Therapy Note   08/01/2017 1:15pm   Type of Therapy and Topic:  Group Therapy:  Overcoming Obstacles   Participation Level:  Did Not Attend--pt invited. Chose to remain in bed.    Description of Group:    In this group patients will be encouraged to explore what they see as obstacles to their own wellness and recovery. They will be guided to discuss their thoughts, feelings, and behaviors related to these obstacles. The group will process together ways to cope with barriers, with attention given to specific choices patients can make. Each patient will be challenged to identify changes they are motivated to make in order to overcome their obstacles. This group will be process-oriented, with patients participating in exploration of their own experiences as well as giving and receiving support and challenge from other group members.   Therapeutic Goals: 1. Patient will identify personal and current obstacles as they relate to admission. 2. Patient will identify barriers that currently interfere with their wellness or overcoming obstacles.  3. Patient will identify feelings, thought process and behaviors related to these barriers. 4. Patient will identify two changes they are willing to make to overcome these obstacles:      Summary of Patient Progress   x   Therapeutic Modalities:   Cognitive Behavioral Therapy Solution Focused Therapy Motivational Interviewing Relapse Prevention Therapy  Kimber Relic Smart, LCSW 08/01/2017 1:07 PM
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
## 2017-08-01 NOTE — Progress Notes (Signed)
D    Pt requesting his trazadone order be increased and he said he takes 300 mg at home   Pt reports he will be discharged tomorrow    He continues to endorse depression and anxiety   He is currently denying SI/HI  A   Verbal support given   Medications administered and effectiveness monitored Q 15 min checks    Spoke to PA who increased his trazadone  R   Pt is safe at present and receptive to verbal support

## 2017-08-02 DIAGNOSIS — I10 Essential (primary) hypertension: Secondary | ICD-10-CM

## 2017-08-02 DIAGNOSIS — E785 Hyperlipidemia, unspecified: Secondary | ICD-10-CM

## 2017-08-02 DIAGNOSIS — R569 Unspecified convulsions: Secondary | ICD-10-CM

## 2017-08-02 DIAGNOSIS — F1110 Opioid abuse, uncomplicated: Secondary | ICD-10-CM

## 2017-08-02 MED ORDER — TRAZODONE HCL 150 MG PO TABS
300.0000 mg | ORAL_TABLET | Freq: Every day | ORAL | Status: DC
  Administered 2017-08-02: 300 mg via ORAL
  Filled 2017-08-02: qty 2
  Filled 2017-08-02: qty 14
  Filled 2017-08-02: qty 2
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
## 2017-08-02 NOTE — Progress Notes (Signed)
Adult Psychoeducational Group Note  Date:  08/02/2017 Time:  12:23 AM  Group Topic/Focus:  Wrap-Up Group:   The focus of this group is to help patients review their daily goal of treatment and discuss progress on daily workbooks.  Participation Level:  Active  Participation Quality:  Appropriate  Affect:  Appropriate  Cognitive:  Appropriate  Insight: Appropriate  Engagement in Group:  Engaged  Modes of Intervention:  Discussion  Additional Comments:  Pt stated his goal for today was to interact more with peers and staff. Pt stated he achieved his goal for today. Pt rated his over all day a 7 out of 10. Pt stated the goal for tomorrow was to attend some of the groups held.  Candy Sledge 08/02/2017, 12:23 AM
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
## 2017-08-02 NOTE — BHH Group Notes (Signed)
[facility] Mental Health Association Group Therapy 08/02/2017 1:15pm  Type of Therapy: Mental Health Association Presentation  Participation Level: Active  Participation Quality: Attentive  Affect: Appropriate  Cognitive: Oriented  Insight: Developing/Improving  Engagement in Therapy: Engaged  Modes of Intervention: Discussion, Education and Socialization  Summary of Progress/Problems: [place] ([place] Speaker came to talk about his personal journey with mental health. The pt processed ways by which to relate to the speaker. [place] speaker provided handouts and educational information pertaining to groups and services offered by the [facility]. Pt was engaged in speaker's presentation and was receptive to resources provided.    [employer], LCSW 08/02/2017 2:25 PM

## 2017-08-02 NOTE — Progress Notes (Signed)
Recreation Therapy Notes  Patient Eligibility Criteria Checklist & Daily Group note for Rec TxIntervention  Date: 01.15.2019 Time: 2:45pm Location: [address]   AAA/T Program Assumption of Risk Form signed by Patient/ or Parent Legal Guardian Yes  Patient is free of allergies or sever asthma Yes  Patient reports no fear of animals Yes  Patient reports no history of cruelty to animals Yes  Patient understands his/her participation is voluntary Yes  Behavioral Response: Did not attend.   Laureen Ochs Novie Maggio, LRT/CTRS         Lane Hacker 08/02/2017 2:55 PM

## 2017-08-03 DIAGNOSIS — Z915 Personal history of self-harm: Secondary | ICD-10-CM

## 2017-08-03 MED ORDER — HYDROXYZINE HCL 25 MG PO TABS: 25.0000 mg | ORAL_TABLET | Freq: Three times a day (TID) | ORAL | 0 refills | Status: DC | PRN

## 2017-08-03 MED ORDER — AMLODIPINE BESYLATE 5 MG PO TABS: 5.0000 mg | ORAL_TABLET | Freq: Every day | ORAL | 0 refills | Status: DC

## 2017-08-03 MED ORDER — DIVALPROEX SODIUM 250 MG PO DR TAB: 250.0000 mg | DELAYED_RELEASE_TABLET | Freq: Two times a day (BID) | ORAL | 0 refills | Status: DC

## 2017-08-03 MED ORDER — ATORVASTATIN CALCIUM 20 MG PO TABS: 20.0000 mg | ORAL_TABLET | Freq: Every day | ORAL | 0 refills | Status: DC

## 2017-08-03 MED ORDER — TRAMADOL HCL 50 MG PO TABS: 50.0000 mg | ORAL_TABLET | Freq: Four times a day (QID) | ORAL | 0 refills | Status: DC | PRN

## 2017-08-03 MED ORDER — TRAZODONE HCL 300 MG PO TABS: 300.0000 mg | ORAL_TABLET | Freq: Every day | ORAL | 0 refills | Status: DC

## 2017-08-03 MED ORDER — ZIPRASIDONE HCL 20 MG PO CAPS: 20.0000 mg | ORAL_CAPSULE | Freq: Two times a day (BID) | ORAL | 0 refills | Status: DC

## 2017-08-03 NOTE — Progress Notes (Signed)
Pt discharged with his father. Pt was ambulatory, stable and appreciative at that time. All papers, prescriptions and medical sample  were given and valuables returned. Verbal understanding expressed. Denies SI/HI and A/VH. Pt given opportunity to express concerns and ask questions.

## 2017-08-03 NOTE — Tx Team (Signed)
Interdisciplinary Treatment and Diagnostic Plan Update  08/03/2017 Time of Session: [place] MRN: [id number]  Principal Diagnosis: MDD (major depressive disorder), recurrent severe, without psychosis ([place])  Secondary Diagnoses: Principal Problem:   MDD (major depressive disorder), recurrent severe, without psychosis ([place])   Current Medications:  Current Facility-Administered Medications  Medication Dose Route Frequency Provider Last Rate Last Dose  . alum & mag hydroxide-simeth (MAALOX/MYLANTA) 200-200-20 MG/5ML suspension 30 mL  30 mL Oral Q4H PRN Ethelene Hal, NP      . amLODipine (NORVASC) tablet 5 mg  5 mg Oral Daily Ethelene Hal, NP   5 mg at 08/03/17 3818  . atorvastatin (LIPITOR) tablet 20 mg  20 mg Oral q1800 Ethelene Hal, NP   20 mg at 08/02/17 1849  . cyclobenzaprine (FLEXERIL) tablet 10 mg  10 mg Oral TID PRN Cobos, Myer Peer, MD   10 mg at 08/03/17 0237  . divalproex (DEPAKOTE) DR tablet 250 mg  250 mg Oral BID Cobos, Myer Peer, MD   250 mg at 08/03/17 2993  . hydrOXYzine (ATARAX/VISTARIL) tablet 25 mg  25 mg Oral TID PRN Ethelene Hal, NP   25 mg at 08/01/17 2115  . ibuprofen (ADVIL,MOTRIN) tablet 800 mg  800 mg Oral Q6H PRN [name], NP   800 mg at 08/02/17 2108  . magnesium hydroxide (MILK OF MAGNESIA) suspension 30 mL  30 mL Oral Daily PRN Ethelene Hal, NP      . thiamine (VITAMIN B-1) tablet 100 mg  100 mg Oral Daily Ethelene Hal, NP   100 mg at 08/03/17 7169   Or  . thiamine (B-1) injection 100 mg  100 mg Intravenous Daily Ethelene Hal, NP      . traMADol Veatrice Bourbon) tablet 50 mg  50 mg Oral Q6H PRN Cobos, Myer Peer, MD   50 mg at 08/03/17 0824  . traZODone (DESYREL) tablet 300 mg  300 mg Oral QHS Izediuno, Laruth Bouchard, MD   300 mg at 08/02/17 2106  . ziprasidone (GEODON) capsule 20 mg  20 mg Oral BID WC Cobos, Myer Peer, MD   20 mg at 08/03/17 6789   PTA Medications: Medications Prior to  Admission  Medication Sig Dispense Refill Last Dose  . amLODipine (NORVASC) 5 MG tablet Take 1 tablet (5 mg total) by mouth daily. 30 tablet 0 Past Week at Unknown time  . atorvastatin (LIPITOR) 20 MG tablet Take 1 tablet (20 mg total) by mouth daily at 6 PM. 30 tablet 0 Past Week at Unknown time  . diphenhydrAMINE (BENADRYL) 25 MG tablet Take 25 mg by mouth every 6 (six) hours as needed for itching.   Past Week at Unknown time  . divalproex (DEPAKOTE) 500 MG DR tablet Take 500 mg by mouth 2 (two) times daily.   Past Week at Unknown time  . [EXPIRED] HYDROmorphone (DILAUDID) 4 MG tablet Take 1 tablet (4 mg total) by mouth every 6 (six) hours as needed for up to 3 days for severe pain. 12 tablet 0 Past Week at Unknown time  . hydrOXYzine (ATARAX/VISTARIL) 25 MG tablet Take 1 tablet (25 mg total) by mouth every 6 (six) hours as needed for anxiety. 30 tablet 0 Past Week at Unknown time  . traZODone (DESYREL) 50 MG tablet Take 100 mg by mouth at bedtime as needed for sleep.    Past Week at Unknown time  . ziprasidone (GEODON) 40 MG capsule Take 40 mg by mouth 2 (two) times  daily with a meal.   Past Week at Unknown time    Patient Stressors: Educational concerns Substance abuse  Patient Strengths: Ability for insight Average or above average intelligence Communication skills General fund of knowledge  Treatment Modalities: Medication Management, Group therapy, Case management,  1 to 1 session with clinician, Psychoeducation, Recreational therapy.   Physician Treatment Plan for Primary Diagnosis: MDD (major depressive disorder), recurrent severe, without psychosis ([place]) Long Term Goal(s): Improvement in symptoms so as ready for discharge Improvement in symptoms so as ready for discharge   Short Term Goals: Ability to disclose and discuss suicidal ideas Ability to demonstrate self-control will improve Ability to maintain clinical measurements within normal limits will improve Compliance with  prescribed medications will improve Ability to identify changes in lifestyle to reduce recurrence of condition will improve Ability to identify and develop effective coping behaviors will improve Ability to identify triggers associated with substance abuse/mental health issues will improve  Medication Management: Evaluate patient's response, side effects, and tolerance of medication regimen.  Therapeutic Interventions: 1 to 1 sessions, Unit Group sessions and Medication administration.  Evaluation of Outcomes: Adequate for discharge   Physician Treatment Plan for Secondary Diagnosis: Principal Problem:   MDD (major depressive disorder), recurrent severe, without psychosis ([place])  Long Term Goal(s): Improvement in symptoms so as ready for discharge Improvement in symptoms so as ready for discharge   Short Term Goals: Ability to disclose and discuss suicidal ideas Ability to demonstrate self-control will improve Ability to maintain clinical measurements within normal limits will improve Compliance with prescribed medications will improve Ability to identify changes in lifestyle to reduce recurrence of condition will improve Ability to identify and develop effective coping behaviors will improve Ability to identify triggers associated with substance abuse/mental health issues will improve     Medication Management: Evaluate patient's response, side effects, and tolerance of medication regimen.  Therapeutic Interventions: 1 to 1 sessions, Unit Group sessions and Medication administration.  Evaluation of Outcomes: Adequate for discharge   RN Treatment Plan for Primary Diagnosis: MDD (major depressive disorder), recurrent severe, without psychosis ([place]) Long Term Goal(s): Knowledge of disease and therapeutic regimen to maintain health will improve  Short Term Goals: Ability to remain free from injury will improve, Ability to participate in decision making will improve and Ability to  disclose and discuss suicidal ideas  Medication Management: RN will administer medications as ordered by provider, will assess and evaluate patient's response and provide education to patient for prescribed medication. RN will report any adverse and/or side effects to prescribing provider.  Therapeutic Interventions: 1 on 1 counseling sessions, Psychoeducation, Medication administration, Evaluate responses to treatment, Monitor vital signs and CBGs as ordered, Perform/monitor CIWA, COWS, AIMS and Fall Risk screenings as ordered, Perform wound care treatments as ordered.  Evaluation of Outcomes: Adequate for discharge   LCSW Treatment Plan for Primary Diagnosis: MDD (major depressive disorder), recurrent severe, without psychosis ([place]) Long Term Goal(s): Safe transition to appropriate next level of care at discharge, Engage patient in therapeutic group addressing interpersonal concerns.  Short Term Goals: Engage patient in aftercare planning with referrals and resources, Facilitate patient progression through stages of change regarding substance use diagnoses and concerns and Identify triggers associated with mental health/substance abuse issues  Therapeutic Interventions: Assess for all discharge needs, 1 to 1 time with Social worker, Explore available resources and support systems, Assess for adequacy in community support network, Educate family and significant other(s) on suicide prevention, Complete Psychosocial Assessment, Interpersonal group therapy.  Evaluation of  Outcomes: Adequate for discharge   Progress in Treatment: Attending groups: No. New to unit. Continuing to assess.  Participating in groups: No. Taking medication as prescribed: Yes. Toleration medication: Yes. Family/Significant other contact made: SPE completed with pt;pt declined to consent to family contact.  Patient understands diagnosis: Yes. Discussing patient identified problems/goals with staff: Yes. Medical problems  stabilized or resolved: Yes. Denies suicidal/homicidal ideation: Yes. Issues/concerns per patient self-inventory: No. Other: n/a   New problem(s) identified: No, Describe:  n/a  New Short Term/Long Term Goal(s): detox, medication management for mood stabilization; elimination of SI thoughts; development of comprehensive mental wellness/sobriety plan.   Discharge Plan or Barriers:  Pt has been going to [facility] for medication management and has appt rescheduled for early next week. [place] pamphlet and AA/NA information also to be provided to pt for additional community support.   Reason for Continuation of Hospitalization: none  Estimated Length of Stay: Wed, 08/03/17  Attendees: Patient: 08/03/2017 9:00 AM  Physician: Dr. Sanjuana Letters MD; Dr. Nancy Fetter MD  08/03/2017 9:00 AM  Nursing: Caroline/Patrice/Jane RN 08/03/2017 9:00 AM  RN Care Manager: x 08/03/2017 9:00 AM  Social Worker: Press photographer, LCSW 08/03/2017 9:00 AM  Recreational Therapist: x 08/03/2017 9:00 AM  Other: Lindell Spar NP; [name] Money NP 08/03/2017 9:00 AM  Other:  08/03/2017 9:00 AM  Other: 08/03/2017 9:00 AM    Scribe for Treatment Team: Kimber Relic Smart, LCSW 08/03/2017 9:00 AM

## 2017-08-03 NOTE — Progress Notes (Signed)
Recreation Therapy Notes  Date: 08/03/17 Time: 0930 Location: [address] Dayroom  Group Topic: Stress Management  Goal Area(s) Addresses:  Patient will verbalize importance of using healthy stress management.  Patient will identify positive emotions associated with healthy stress management.   Behavioral Response: Engaged  Intervention: Stress Management  Activity :  Meditation.  LRT introduced the stress management group of meditation.  LRT played a meditation from the Calm app to participate in the meditation.  Patients were to listen and follow along with the meditation in order to engage in the activity.  Education:  Stress Management, Discharge Planning.   Education Outcome: Acknowledges edcuation/In group clarification offered/Needs additional education  Clinical Observations/Feedback: Pt attended group.    Victorino Sparrow, LRT/CTRS         Victorino Sparrow A 08/03/2017 12:27 PM

## 2017-08-03 NOTE — BHH Group Notes (Signed)
Daily Orientation  Date:  08/03/2017  Time:  9:14 AM  Type of Therapy:  Nurse Education  /  The group focuses on orienting patients to the daily programming schedule, explaining unit flow and staff responsibilites and establishing therapeutic relationhisp with patients.  Participation Level:  Active  Participation Quality:  Attentive  Affect:  Appropriate  Cognitive:  Alert  Insight:  Good  Engagement in Group:  Engaged  Modes of Intervention:  Education  Summary of Progress/Problems:  Lauralyn Primes 08/03/2017, 9:14 AM
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
## 2017-08-03 NOTE — [facility] Suicide Risk Assessment (Signed)
[facility] Discharge Suicide Risk Assessment   Principal Problem: MDD (major depressive disorder), recurrent severe, without psychosis ([place]) Discharge Diagnoses: Substance Induced Mood Disorder Patient Active Problem List   Diagnosis Date Noted  . Insomnia [G47.00]   . HTN (hypertension), benign [I10]   . Severe episode of recurrent major depressive disorder, without psychotic features ([place]) [F33.2]   . Depression, major, severe recurrence ([place]) [F33.2] 12/30/2015  . Substance induced mood disorder ([place]) [F19.94] 12/02/2015  . Mood disorder in conditions classified elsewhere [F06.30]   . MDD (major depressive disorder), recurrent severe, without psychosis ([place]) [F33.2] 10/08/2015  . Malnutrition of moderate degree [E44.0] 09/24/2015  . Alcohol withdrawal ([place]) [F10.239] 09/23/2015  . Tobacco use disorder [F17.200] 07/16/2015  . Elevated transaminase level [R74.0]   . Drug overdose, intentional ([place]) [T50.902A] 07/12/2015  . Alcohol use disorder, severe, dependence ([place]) [F10.20] 04/15/2015  . Cocaine abuse with cocaine-induced mood disorder ([place]) [F14.14] 04/11/2015  . Overdose [T50.901A] 04/10/2015  . Severe recurrent major depressive disorder with psychotic features ([place]) [F33.3]   . Severe major depression with psychotic features ([place]) [F32.3] 09/11/2014  . Alcohol-induced mood disorder ([place]) [F10.94] 09/10/2014  . Suicidal ideation [R45.851]   . Tylenol overdose [T39.1X1A]   . Polysubstance abuse ([place]) [F19.10]   . Overdose of acetaminophen [T39.1X1A] 08/03/2014  . Overdose by acetaminophen [T39.1X1A] 08/03/2014  . Cocaine abuse ([place]) [F14.10] 04/16/2014  . Thrombocytopenia ([place]) [D69.6] 04/15/2014  . Urinary tract infection, site not specified [N39.0] 04/15/2014  . Chest pain, unspecified [R07.9] 04/15/2014  . D-dimer, elevated [R79.89] 04/15/2014  . Transaminitis [R74.0] 09/24/2013  . Cocaine dependence ([place]) [F14.20] 09/20/2013  . 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 Seizure  ([place]) [R56.9] 03/15/2013  . Syncope [R55] 01/02/2013  . Leukocytopenia, unspecified [D72.819] 01/02/2013  . Left kidney mass [N28.89] 12/24/2012  . PTSD (post-traumatic stress disorder) [F43.10] 07/06/2012  . Peripheral vascular disease ([place]) [I73.9] 01/14/2012  . SEIZURE DISORDER [R56.9] 10/03/2008  . LIVER FUNCTION TESTS, ABNORMAL [R94.5] 12/29/2007  . HYPERCHOLESTEROLEMIA [E78.00] 03/21/2007  . Essential hypertension [I10] 03/21/2007    Total Time spent with patient: 45 minutes  Musculoskeletal: Strength & Muscle Tone: within normal limits Gait & Station: Slowed a bit Patient leans: N/A  Psychiatric Specialty Exam: Review of Systems  Constitutional: Negative.   HENT: Negative.   Eyes: Negative.   Respiratory: Negative.   Cardiovascular: Negative.   Gastrointestinal: Negative.   Genitourinary: Negative.   Musculoskeletal: Positive for myalgias and neck pain.  Skin: Negative.   Neurological: Negative.   Endo/Heme/Allergies: Negative.   Psychiatric/Behavioral: Negative for depression, hallucinations, memory loss, substance abuse and suicidal ideas. The patient is not nervous/anxious and does not have insomnia.     Blood pressure 102/74, pulse 84, temperature 98.4 F (36.9 C), temperature source Oral, resp. rate 16, height 5\' 6"  (1.676 m), weight 66.7 kg (147 lb).Body mass index is 23.73 kg/m.  General Appearance: Neatly dressed, pleasant, engaging well and cooperative. Appropriate behavior. Not in any distress. Good relatedness. Not internally stimulated  Eye Contact::  Good  Speech:  Spontaneous, normal prosody. Normal tone and rate.   Volume:  Normal  Mood:  Euthymic  Affect:  Appropriate and Full Range  Thought Process:  Linear  Orientation:  Full (Time, Place, and Person)  Thought Content:  No delusional theme. No preoccupation with violent thoughts. No negative ruminations. No obsession.  No hallucination in any modality.   Suicidal Thoughts:  No  Homicidal  Thoughts:  No  Memory:  Immediate;   Good Recent;  Good Remote;   Good  Judgement:  Good  Insight:  Good  Psychomotor Activity:  Normal  Concentration:  Good  Recall:  Good  Fund of Knowledge:Good  Language: Good  Akathisia:  Negative  Handed:    AIMS (if indicated):     Assets:  Communication Skills Desire for Improvement Housing Resilience  Sleep:  Number of Hours: 5  Cognition: WNL  ADL's:  Intact   Mental Status Per Nursing Assessment::   37 y.o AAM, single, no kids, recently lost his job, lives with his parents. Background history of MDD and SUD. Presented to the ER in company of his family. Had expressed increasing suicidal thoughts. Was intoxicated with alcohol and cocaine at presentation.  Toxicology was negative,  UDS for opiates and cocaine. BAL 367 mg/dl. Patient has been detoxed from alcohol and restarted on his medications.  Seen today. Reports that he is in good spirits. Not feeling depressed. Reports normal energy and interest. Has been maintaining normal biological functions. He is able to think clearly. He is able to focus on task. His thoughts are not crowded or racing. No evidence of mania. No hallucination in any modality. He is not making any delusional statement. No passivity of will/thought. He is fully in touch with reality. No thoughts of suicide. No thoughts of homicide. No violent thoughts. No overwhelming anxiety. No access to weapons. No craving for substances.  Nursing staff reports that patient has been appropriate on the unit. Patient has been interacting well with peers. No behavioral issues. Patient has not voiced any suicidal thoughts. Patient has not been observed to be internally stimulated. Patient has been adherent with treatment recommendations. Patient has been tolerating their medication well.   Patient was discussed at team. Team members feels that patient is back to his baseline level of function. Team agrees with plan to discharge patient  today.  Demographic Factors:  Unemployed  Loss Factors: Decrease in vocational status and Decline in physical health  Historical Factors: Impulsivity  Risk Reduction Factors:   Sense of responsibility to family, Living with another person, especially a relative, Positive social support, Positive therapeutic relationship and Positive coping skills or problem solving skills  Continued Clinical Symptoms:  As above   Cognitive Features That Contribute To Risk:  None    Suicide Risk:  Minimal: No identifiable suicidal ideation.  Patient is not having any thoughts of suicide at this time. Modifiable risk factors targeted during this admission includes depression, chronic pain and substance use. Demographical and historical risk factors cannot be modified. Patient is now engaging well. Patient is reliable and is future oriented. We have buffered patient's support structures. At this point, patient is at low risk of suicide. Patient is aware of the effects of psychoactive substances on decision making process. Patient has been provided with emergency contacts. Patient acknowledges to use resources provided if unforseen circumstances changes their current risk stratification.   Follow-up Information    [facility] Follow up on 08/09/2017.   Why:  Hospital follow-up on Tuesday, 1/22 at 8:20AM. Please bring: medication list. Thank you.  Contact information: [address] [place] [address] [phone]           Plan Of Care/Follow-up recommendations:  1. Continue current psychotropic medications 2. Mental health and addiction follow up as arranged.  3. Discharge in care of his family 4. Provided limited quantity of prescriptions   [place], MD 08/03/2017, 9:40 AM

## 2017-08-03 NOTE — Progress Notes (Signed)
  [facility] Adult Case Management Discharge Plan :  Will you be returning to the same living situation after discharge:  Yes,  home At discharge, do you have transportation home?: Yes,  pt's mother Do you have the ability to pay for your medications: Yes,  mental health  Release of information consent forms completed and submitted to medical record by CSW.  Patient to Follow up at: Follow-up Information    [facility] Follow up on 08/09/2017.   Why:  Hospital follow-up on Tuesday, 1/22 at 8:20AM. Please bring: medication list. Thank you.  Contact information: [address] [place] [address] [phone]           Next level of care provider has access to [place] and Suicide Prevention discussed: Yes,  SPE completed with pt; pt declined to consent to family contact.   Have you used any form of tobacco in the last 30 days? (Cigarettes, Smokeless Tobacco, Cigars, and/or Pipes): No  Has patient been referred to the Quitline?: N/A patient is not a smoker  Patient has been referred for addiction treatment: Yes  [employer], LCSW 08/03/2017, 9:06 AM

## 2017-08-05 ENCOUNTER — Ambulatory Visit (INDEPENDENT_AMBULATORY_CARE_PROVIDER_SITE_OTHER): Payer: Self-pay | Admitting: Orthopaedic Surgery

## 2017-08-05 ENCOUNTER — Ambulatory Visit (INDEPENDENT_AMBULATORY_CARE_PROVIDER_SITE_OTHER): Payer: Self-pay

## 2017-08-05 ENCOUNTER — Encounter (INDEPENDENT_AMBULATORY_CARE_PROVIDER_SITE_OTHER): Payer: Self-pay | Admitting: Orthopaedic Surgery

## 2017-08-05 DIAGNOSIS — S4382XA Sprain of other specified parts of left shoulder girdle, initial encounter: Secondary | ICD-10-CM | POA: Insufficient documentation

## 2017-08-05 DIAGNOSIS — S42115A Nondisplaced fracture of body of scapula, left shoulder, initial encounter for closed fracture: Secondary | ICD-10-CM

## 2017-08-05 DIAGNOSIS — S42032A Displaced fracture of lateral end of left clavicle, initial encounter for closed fracture: Secondary | ICD-10-CM | POA: Insufficient documentation

## 2017-08-05 DIAGNOSIS — S43109A Unspecified dislocation of unspecified acromioclavicular joint, initial encounter: Secondary | ICD-10-CM | POA: Insufficient documentation
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
## 2017-08-08 ENCOUNTER — Encounter (HOSPITAL_COMMUNITY): Payer: Self-pay | Admitting: *Deleted

## 2017-08-08 ENCOUNTER — Other Ambulatory Visit: Payer: Self-pay
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
## 2017-08-10 ENCOUNTER — Ambulatory Visit (HOSPITAL_COMMUNITY): Payer: Self-pay | Admitting: Emergency Medicine

## 2017-08-10 ENCOUNTER — Ambulatory Visit (HOSPITAL_COMMUNITY): Payer: Self-pay

## 2017-08-10 ENCOUNTER — Encounter (HOSPITAL_COMMUNITY)
Admission: RE | Disposition: A | Discharge status: Home / Self Care | Payer: Self-pay | Source: Ambulatory Visit | Attending: Orthopaedic Surgery

## 2017-08-10 ENCOUNTER — Encounter (HOSPITAL_COMMUNITY): Payer: Self-pay | Admitting: *Deleted

## 2017-08-10 ENCOUNTER — Ambulatory Visit (HOSPITAL_COMMUNITY)
Admission: RE | Admit: 2017-08-10 | Discharge: 2017-08-10 | Disposition: A | Discharge status: Home / Self Care | Payer: Self-pay | Source: Ambulatory Visit | Attending: Orthopaedic Surgery | Admitting: Orthopaedic Surgery

## 2017-08-10 DIAGNOSIS — Z885 Allergy status to narcotic agent status: Secondary | ICD-10-CM | POA: Insufficient documentation

## 2017-08-10 DIAGNOSIS — S42032A Displaced fracture of lateral end of left clavicle, initial encounter for closed fracture: Secondary | ICD-10-CM | POA: Insufficient documentation

## 2017-08-10 DIAGNOSIS — Z419 Encounter for procedure for purposes other than remedying health state, unspecified: Secondary | ICD-10-CM

## 2017-08-10 DIAGNOSIS — F419 Anxiety disorder, unspecified: Secondary | ICD-10-CM | POA: Insufficient documentation

## 2017-08-10 DIAGNOSIS — Z87891 Personal history of nicotine dependence: Secondary | ICD-10-CM | POA: Insufficient documentation

## 2017-08-10 DIAGNOSIS — Z9889 Other specified postprocedural states: Secondary | ICD-10-CM

## 2017-08-10 DIAGNOSIS — I739 Peripheral vascular disease, unspecified: Secondary | ICD-10-CM | POA: Insufficient documentation

## 2017-08-10 DIAGNOSIS — F1411 Cocaine abuse, in remission: Secondary | ICD-10-CM | POA: Insufficient documentation

## 2017-08-10 DIAGNOSIS — Z880 Allergy status to penicillin: Secondary | ICD-10-CM | POA: Insufficient documentation

## 2017-08-10 DIAGNOSIS — E7800 Pure hypercholesterolemia, unspecified: Secondary | ICD-10-CM | POA: Insufficient documentation

## 2017-08-10 DIAGNOSIS — I2510 Atherosclerotic heart disease of native coronary artery without angina pectoris: Secondary | ICD-10-CM | POA: Insufficient documentation

## 2017-08-10 DIAGNOSIS — I10 Essential (primary) hypertension: Secondary | ICD-10-CM | POA: Insufficient documentation

## 2017-08-10 DIAGNOSIS — S4382XA Sprain of other specified parts of left shoulder girdle, initial encounter: Secondary | ICD-10-CM | POA: Insufficient documentation

## 2017-08-10 DIAGNOSIS — S43109A Unspecified dislocation of unspecified acromioclavicular joint, initial encounter: Secondary | ICD-10-CM

## 2017-08-10 DIAGNOSIS — F329 Major depressive disorder, single episode, unspecified: Secondary | ICD-10-CM | POA: Insufficient documentation

## 2017-08-10 DIAGNOSIS — X58XXXA Exposure to other specified factors, initial encounter: Secondary | ICD-10-CM | POA: Insufficient documentation

## 2017-08-10 DIAGNOSIS — Z853 Personal history of malignant neoplasm of breast: Secondary | ICD-10-CM | POA: Insufficient documentation

## 2017-08-10 DIAGNOSIS — Z79899 Other long term (current) drug therapy: Secondary | ICD-10-CM | POA: Insufficient documentation

## 2017-08-10 DIAGNOSIS — F1011 Alcohol abuse, in remission: Secondary | ICD-10-CM | POA: Insufficient documentation

## 2017-08-10 DIAGNOSIS — Y9289 Other specified places as the place of occurrence of the external cause: Secondary | ICD-10-CM | POA: Insufficient documentation

## 2017-08-10 DIAGNOSIS — F209 Schizophrenia, unspecified: Secondary | ICD-10-CM | POA: Insufficient documentation

## 2017-08-10 HISTORY — PX: ORIF CLAVICULAR FRACTURE: SHX5055

## 2017-08-10 HISTORY — PX: RECONSTRUCTION OF CORACOCLAVICULAR LIGAMENT: SHX6045

## 2017-08-10 HISTORY — DX: Anxiety disorder, unspecified: F41.9

## 2017-08-10 LAB — COMPREHENSIVE METABOLIC PANEL
ALT: 8 U/L — ABNORMAL LOW (ref 17–63)
ANION GAP: 10 (ref 5–15)
AST: 45 U/L — ABNORMAL HIGH (ref 15–41)
Albumin: 3.1 g/dL — ABNORMAL LOW (ref 3.5–5.0)
Alkaline Phosphatase: 87 U/L (ref 38–126)
BILIRUBIN TOTAL: 1.4 mg/dL — AB (ref 0.3–1.2)
BUN: 12 mg/dL (ref 6–20)
CHLORIDE: 102 mmol/L (ref 101–111)
CO2: 24 mmol/L (ref 22–32)
Calcium: 8.7 mg/dL — ABNORMAL LOW (ref 8.9–10.3)
Creatinine, Ser: 0.9 mg/dL (ref 0.61–1.24)
Glucose, Bld: 76 mg/dL (ref 65–99)
POTASSIUM: 6 mmol/L — AB (ref 3.5–5.1)
Sodium: 136 mmol/L (ref 135–145)
TOTAL PROTEIN: 7 g/dL (ref 6.5–8.1)

## 2017-08-10 LAB — CBC
HEMATOCRIT: 38.8 % — AB (ref 39.0–52.0)
HEMOGLOBIN: 12.9 g/dL — AB (ref 13.0–17.0)
MCH: 29.5 pg (ref 26.0–34.0)
MCHC: 33.2 g/dL (ref 30.0–36.0)
MCV: 88.8 fL (ref 78.0–100.0)
Platelets: 330 10*3/uL (ref 150–400)
RBC: 4.37 MIL/uL (ref 4.22–5.81)
RDW: 15.7 % — ABNORMAL HIGH (ref 11.5–15.5)
WBC: 6 10*3/uL (ref 4.0–10.5)

## 2017-08-10 SURGERY — OPEN REDUCTION INTERNAL FIXATION (ORIF) CLAVICULAR FRACTURE
Anesthesia: General | Site: Shoulder | Laterality: Left

## 2017-08-10 MED ORDER — 0.9 % SODIUM CHLORIDE (POUR BTL) OPTIME
TOPICAL | Status: DC | PRN
  Administered 2017-08-10 (×3): 1000 mL

## 2017-08-10 MED ORDER — HYDROMORPHONE HCL 1 MG/ML IJ SOLN
INTRAMUSCULAR | Status: AC
  Filled 2017-08-10: qty 0.5

## 2017-08-10 MED ORDER — BUPIVACAINE-EPINEPHRINE 0.5% -1:200000 IJ SOLN
INTRAMUSCULAR | Status: DC | PRN
  Administered 2017-08-10: 20 mL

## 2017-08-10 MED ORDER — TRANEXAMIC ACID 1000 MG/10ML IV SOLN
INTRAVENOUS | Status: AC | PRN
  Administered 2017-08-10: 2000 mg via TOPICAL

## 2017-08-10 MED ORDER — CHLORHEXIDINE GLUCONATE 4 % EX LIQD: 60.0000 mL | Freq: Once | CUTANEOUS | Status: DC

## 2017-08-10 MED ORDER — VANCOMYCIN HCL IN DEXTROSE 1-5 GM/200ML-% IV SOLN
1000.0000 mg | INTRAVENOUS | Status: DC
  Filled 2017-08-10: qty 200

## 2017-08-10 MED ORDER — FENTANYL CITRATE (PF) 100 MCG/2ML IJ SOLN
INTRAMUSCULAR | Status: DC | PRN
  Administered 2017-08-10: 150 ug via INTRAVENOUS
  Administered 2017-08-10 (×3): 100 ug via INTRAVENOUS
  Administered 2017-08-10: 50 ug via INTRAVENOUS

## 2017-08-10 MED ORDER — ONDANSETRON HCL 4 MG/2ML IJ SOLN
INTRAMUSCULAR | Status: DC | PRN
  Administered 2017-08-10: 4 mg via INTRAVENOUS

## 2017-08-10 MED ORDER — DIPHENHYDRAMINE HCL 50 MG/ML IJ SOLN
INTRAMUSCULAR | Status: AC
  Filled 2017-08-10: qty 1

## 2017-08-10 MED ORDER — LIDOCAINE HCL (CARDIAC) 20 MG/ML IV SOLN
INTRAVENOUS | Status: DC | PRN
  Administered 2017-08-10: 60 mg via INTRAVENOUS

## 2017-08-10 MED ORDER — SENNOSIDES-DOCUSATE SODIUM 8.6-50 MG PO TABS: 1.0000 | ORAL_TABLET | Freq: Every evening | ORAL | 1 refills | Status: DC | PRN

## 2017-08-10 MED ORDER — OXYCODONE HCL 5 MG PO TABS: 5.0000 mg | ORAL_TABLET | ORAL | 0 refills | Status: DC | PRN

## 2017-08-10 MED ORDER — TRANEXAMIC ACID 1000 MG/10ML IV SOLN
2000.0000 mg | Freq: Once | INTRAVENOUS | Status: DC
  Filled 2017-08-10: qty 20

## 2017-08-10 MED ORDER — MIDAZOLAM HCL 5 MG/5ML IJ SOLN
INTRAMUSCULAR | Status: DC | PRN
  Administered 2017-08-10: 2 mg via INTRAVENOUS

## 2017-08-10 MED ORDER — FENTANYL CITRATE (PF) 100 MCG/2ML IJ SOLN: 25.0000 ug | INTRAMUSCULAR | Status: DC | PRN

## 2017-08-10 MED ORDER — MIDAZOLAM HCL 2 MG/2ML IJ SOLN
INTRAMUSCULAR | Status: AC
  Filled 2017-08-10: qty 2

## 2017-08-10 MED ORDER — PROPOFOL 10 MG/ML IV BOLUS
INTRAVENOUS | Status: DC | PRN
  Administered 2017-08-10: 160 mg via INTRAVENOUS

## 2017-08-10 MED ORDER — PROMETHAZINE HCL 25 MG PO TABS: 25.0000 mg | ORAL_TABLET | Freq: Four times a day (QID) | ORAL | 1 refills | Status: DC | PRN

## 2017-08-10 MED ORDER — HYDROMORPHONE HCL 1 MG/ML IJ SOLN
INTRAMUSCULAR | Status: DC | PRN
  Administered 2017-08-10: 0.5 mg via INTRAVENOUS

## 2017-08-10 MED ORDER — DIPHENHYDRAMINE HCL 50 MG/ML IJ SOLN
INTRAMUSCULAR | Status: DC | PRN
  Administered 2017-08-10: 25 mg via INTRAVENOUS

## 2017-08-10 MED ORDER — FENTANYL CITRATE (PF) 250 MCG/5ML IJ SOLN
INTRAMUSCULAR | Status: AC
  Filled 2017-08-10: qty 5

## 2017-08-10 MED ORDER — METHOCARBAMOL 750 MG PO TABS: 750.0000 mg | ORAL_TABLET | Freq: Two times a day (BID) | ORAL | 0 refills | Status: DC | PRN

## 2017-08-10 MED ORDER — ONDANSETRON HCL 4 MG/2ML IJ SOLN: 4.0000 mg | Freq: Once | INTRAMUSCULAR | Status: DC | PRN

## 2017-08-10 MED ORDER — SODIUM CHLORIDE 0.9 % IV SOLN
INTRAVENOUS | Status: DC | PRN
  Administered 2017-08-10: 1000 mg via INTRAVENOUS

## 2017-08-10 MED ORDER — DIPHENHYDRAMINE HCL 50 MG/ML IJ SOLN
12.5000 mg | Freq: Once | INTRAMUSCULAR | Status: AC
  Administered 2017-08-10: 12.5 mg via INTRAVENOUS

## 2017-08-10 MED ORDER — SUGAMMADEX SODIUM 200 MG/2ML IV SOLN
INTRAVENOUS | Status: DC | PRN
  Administered 2017-08-10: 200 mg via INTRAVENOUS

## 2017-08-10 MED ORDER — LACTATED RINGERS IV SOLN
INTRAVENOUS | Status: DC
  Administered 2017-08-10 (×2): via INTRAVENOUS

## 2017-08-10 MED ORDER — ROCURONIUM BROMIDE 100 MG/10ML IV SOLN
INTRAVENOUS | Status: DC | PRN
  Administered 2017-08-10 (×2): 50 mg via INTRAVENOUS

## 2017-08-10 MED ORDER — BUPIVACAINE-EPINEPHRINE (PF) 0.5% -1:200000 IJ SOLN
INTRAMUSCULAR | Status: AC
  Filled 2017-08-10: qty 30

## 2017-08-10 MED ORDER — ONDANSETRON HCL 4 MG PO TABS: 4.0000 mg | ORAL_TABLET | Freq: Three times a day (TID) | ORAL | 0 refills | Status: DC | PRN

## 2017-08-10 SURGICAL SUPPLY — 47 items
BIT DRILL 2 CANN GRADUATED (BIT) ×3 IMPLANT
BIT DRILL 2.5 CANN ENDOSCOPIC (BIT) ×3 IMPLANT
BUTTON DIST CLAVICLE PLATE TR (Anchor) ×6 IMPLANT
CLOSURE STERI-STRIP 1/2X4 (GAUZE/BANDAGES/DRESSINGS) ×2
CLOSURE WOUND 1/2 X4 (GAUZE/BANDAGES/DRESSINGS) ×1
CLSR STERI-STRIP ANTIMIC 1/2X4 (GAUZE/BANDAGES/DRESSINGS) ×4 IMPLANT
COVER SURGICAL LIGHT HANDLE (MISCELLANEOUS) ×3 IMPLANT
DRAPE C-ARM 42X72 X-RAY (DRAPES) ×3 IMPLANT
DRAPE U-SHAPE 47X51 STRL (DRAPES) ×6 IMPLANT
DRSG TEGADERM 4X4.75 (GAUZE/BANDAGES/DRESSINGS) ×6 IMPLANT
DURAPREP 26ML APPLICATOR (WOUND CARE) ×3 IMPLANT
ELECT CAUTERY BLADE 6.4 (BLADE) ×3 IMPLANT
ELECT REM PT RETURN 9FT ADLT (ELECTROSURGICAL) ×3
ELECTRODE REM PT RTRN 9FT ADLT (ELECTROSURGICAL) ×1 IMPLANT
GAUZE SPONGE 4X4 12PLY STRL (GAUZE/BANDAGES/DRESSINGS) ×3 IMPLANT
GAUZE XEROFORM 1X8 LF (GAUZE/BANDAGES/DRESSINGS) ×3 IMPLANT
GLOVE SKINSENSE NS SZ7.5 (GLOVE) ×4
GLOVE SKINSENSE STRL SZ7.5 (GLOVE) ×2 IMPLANT
GOWN STRL REIN XL XLG (GOWN DISPOSABLE) ×6 IMPLANT
K-WIRE .062X3 (DISPOSABLE) ×3
KIT BASIN OR (CUSTOM PROCEDURE TRAY) ×3 IMPLANT
KIT ROOM TURNOVER OR (KITS) ×3 IMPLANT
KWIRE .062X3 (DISPOSABLE) ×1 IMPLANT
MANIFOLD NEPTUNE II (INSTRUMENTS) ×3 IMPLANT
NEEDLE HYPO 25GX1X1/2 BEV (NEEDLE) IMPLANT
NS IRRIG 1000ML POUR BTL (IV SOLUTION) ×3 IMPLANT
PACK SHOULDER (CUSTOM PROCEDURE TRAY) ×3 IMPLANT
PAD ARMBOARD 7.5X6 YLW CONV (MISCELLANEOUS) ×6 IMPLANT
PIN DRILL 3.7MM KNTLS DIST (PIN) ×3 IMPLANT
PLATE DISTAL CLAVICLE ING LFT (Plate) ×3 IMPLANT
PUTTY DBX 2.5CC (Putty) IMPLANT
PUTTY DBX 2.5CC DEPUY (Putty) IMPLANT
SCREW LOW PROFILE 3.5X16 (Screw) ×3 IMPLANT
SLING ARM FOAM STRAP LRG (SOFTGOODS) ×3 IMPLANT
SPONGE LAP 18X18 X RAY DECT (DISPOSABLE) ×6 IMPLANT
STRIP CLOSURE SKIN 1/2X4 (GAUZE/BANDAGES/DRESSINGS) ×2 IMPLANT
SUCTION FRAZIER HANDLE 10FR (MISCELLANEOUS) ×2
SUCTION TUBE FRAZIER 10FR DISP (MISCELLANEOUS) ×1 IMPLANT
SUT MNCRL AB 4-0 PS2 18 (SUTURE) ×3 IMPLANT
SUT VIC AB 0 CT1 27 (SUTURE) ×2
SUT VIC AB 0 CT1 27XBRD ANBCTR (SUTURE) ×1 IMPLANT
SUT VIC AB 2-0 CT1 27 (SUTURE) ×3
SUT VIC AB 2-0 CT1 TAPERPNT 27 (SUTURE) ×1 IMPLANT
SUT VIC AB 3-0 SH 27 (SUTURE) ×4
SUT VIC AB 3-0 SH 27X BRD (SUTURE) ×2 IMPLANT
SYR CONTROL 10ML LL (SYRINGE) IMPLANT
UNDERPAD 30X30 (UNDERPADS AND DIAPERS) ×3 IMPLANT

## 2017-08-10 NOTE — Anesthesia Procedure Notes (Signed)
Procedure Name: Intubation Date/Time: 08/10/2017 1:03 PM Performed by: Essa Wenk T, CRNA Pre-anesthesia Checklist: Patient identified, Emergency Drugs available, Suction available and Patient being monitored Patient Re-evaluated:Patient Re-evaluated prior to induction Oxygen Delivery Method: Circle system utilized Preoxygenation: Pre-oxygenation with 100% oxygen Induction Type: IV induction Ventilation: Mask ventilation without difficulty Laryngoscope Size: Glidescope and 3 Grade View: Grade I Tube type: Oral Tube size: 7.5 mm Number of attempts: 1 Airway Equipment and Method: Patient positioned with wedge pillow,  Stylet and Video-laryngoscopy Placement Confirmation: ETT inserted through vocal cords under direct vision,  positive ETCO2 and breath sounds checked- equal and bilateral Secured at: 22 cm Tube secured with: Tape Dental Injury: Teeth and Oropharynx as per pre-operative assessment

## 2017-08-10 NOTE — Transfer of Care (Signed)
Immediate Anesthesia Transfer of Care Note  Patient: [name]  Procedure(s) Performed: OPEN REDUCTION INTERNAL FIXATION (ORIF) LEFT CLAVICLE FRACTURE WITH RECONSTRUCTION OF CORACOCLAVICULAR LIGAMENT (Left Shoulder) RECONSTRUCTION OF CORACOCLAVICULAR LIGAMENT (Left Shoulder)  Patient Location: PACU  Anesthesia Type:General  Level of Consciousness: awake and alert   Airway & Oxygen Therapy: Patient Spontanous Breathing and Patient connected to nasal cannula oxygen  Post-op Assessment: Report given to RN and Post -op Vital signs reviewed and stable  Post vital signs: Reviewed and stable  Last Vitals:  Vitals:   08/10/17 1022 08/10/17 1537  BP: 117/77 126/87  Pulse: 94 95  Resp: 18 12  Temp: 36.7 C (!) 36.2 C  SpO2: 98% 99%    Last Pain:  Vitals:   08/10/17 1537  TempSrc:   PainSc: Asleep      Patients Stated Pain Goal: 4 (20/80/22 3361)  Complications: No apparent anesthesia complications

## 2017-08-10 NOTE — Op Note (Signed)
Date of Surgery: 08/10/2017  INDICATIONS: Mr. Lurz is a 37 y.o.-year-old male with a superiorly displaced distal clavicle fracture;  The patient did consent to the procedure after discussion of the risks and benefits.  PREOPERATIVE DIAGNOSIS: Left distal clavicle fracture with rupture of both CC ligaments  POSTOPERATIVE DIAGNOSIS: Same.  PROCEDURE: Open treatment of distal clavicle fracture and AC joint dislocation   SURGEON: N. Eduard Roux, M.D.  ASSIST: Ciro Backer [place], [place]; necessary for the timely completion of procedure and due to complexity of procedure.  ANESTHESIA:  general  IV FLUIDS AND URINE: See anesthesia.  ESTIMATED BLOOD LOSS: 100 mL.  IMPLANTS: Arthrex distal clavicle plate, tightrope  DRAINS: none  COMPLICATIONS: None.  DESCRIPTION OF PROCEDURE: The patient was brought to the operating room and placed supine on the operating table.  The patient had been signed prior to the procedure and this was documented. The patient had the anesthesia placed by the anesthesiologist.  A time-out was performed to confirm that this was the correct patient, site, side and location. The patient did receive antibiotics prior to the incision and was re-dosed during the procedure as needed at indicated intervals. The patient had the operative extremity prepped and draped in the standard surgical fashion.    Using fluoroscopic guidance the planned incision was marked out on the skin over the lateral half of the clavicle.  An incision was then made.  Dissection was carried through the subcutaneous tissue.  Continuous nerves were identified and mobilized and protected.  Dissection was continued down to the clavicle.  Subperiosteal elevation was performed.  The fracture was exposed.  There was already early signs of immature callus formation.  This was all debrided using a rondure.  Soft tissue releases were then performed in order to mobilize the clavicle.  The superior clavicle plate was  then placed on the superior aspect of the clavicle at the appropriate position.  3 nonlocking screws were placed through the proximal fracture segment bicortically each with excellent purchase.  I then turned my attention to fixation of the distal clavicle fragment.  The distal clavicle fracture fragment was significantly smaller than what it appeared to be on x-ray.  I attempted to reduce the fracture and place screw fixation within the distal fragment but each time I did so it fragmented the distal clavicle piece.  After multiple attempts it was clear that I was not going to be able to place any screw fixation within the distal clavicle piece.  The distal clavicle was left in situ.  I then turned my attention to treating the [facility] joint dislocation using an Arthrex tight rope construct.  I first bluntly dissected down inferiorly from the clavicle onto the coracoid.  The borders of the coracoid were identified and retractors were placed.  I then drilled a superior to inferior bicortical hole through the coracoid using the specific drill.  I then drilled another bicortical hole through the clavicle.  The tight rope was then advanced through the clavicle and then through the coracoid.  Care was taken not to plunge with each step.  The button was then flipped inferior to the coracoid and confirmed under fluoroscopy.  The tight rope was then tightened down gently in order to reduce the acromioclavicular dislocation.  This was confirmed under fluoroscopy.  Final x-rays were taken.  The wound was then thoroughly irrigated and closed in a layered fashion using 0 Vicryl, 3-0 Vicryl, 4-0 Monocryl.  Sterile dressings were applied.  The arm was placed  in a shoulder sling.  Patient tolerated the procedure well had no major complications.  POSTOPERATIVE PLAN: Patient will be nonweightbearing to the left upper extremity for 6 weeks.  We will begin mobilization at 1-2 weeks.  Azucena Cecil, MD [place] 8:29 PM

## 2017-08-10 NOTE — H&P (Signed)
PREOPERATIVE H&P  Chief Complaint: left distal clavicle fracture  HPI: [name] is a 37 y.o. male who presents for surgical treatment of left distal clavicle fracture.  He denies any changes in medical history.  Past Medical History:  Diagnosis Date  . Angina   . Anxiety    panic attack  . Bipolar 1 disorder ([place])   . Breast CA ([place]) dx'd 2009   bil w/ bil masectomy and oral meds  . Cancer [facility])    kidney cancer  . Coronary artery disease   . Depression   . H/O suicide attempt 2015   overdose  . Headache(784.0)   . Hypercholesteremia   . Hypertension   . Liver cirrhosis ([place])   . Pancreatitis   . Peripheral vascular disease [facility]) April 2011   Left Pop  . Schizophrenia ([place])   . Seizures ([place])    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  . Shortness of breath    Past Surgical History:  Procedure Laterality Date  . BREAST SURGERY    . BREAST SURGERY     bilateral breast silocone  removal  . CHEST SURGERY    . left kidney removal    . left leg surgery     "popiteal artery clogged"  . MASTECTOMY Bilateral   . NEPHRECTOMY Left    Social History   Socioeconomic History  . Marital status: Single    Spouse name: None  . Number of children: None  . Years of education: None  . Highest education level: None  Social Needs  . Financial resource strain: None  . Food insecurity - worry: None  . Food insecurity - inability: None  . Transportation needs - medical: None  . Transportation needs - non-medical: None  Occupational History  . None  Tobacco Use  . Smoking status: Former Research scientist (life sciences)  . Smokeless tobacco: Never Used  . Tobacco comment: smoked on and off 16 years- quit 2016  Substance and Sexual Activity  . Alcohol use: Yes    Comment: 08/08/17- No alcohol since 07/26/17  . Drug use: No    Comment: 08/08/2017- no cocaine for 1 month  . Sexual activity: Yes    Birth control/protection: Condom    Comment: anal  Other Topics Concern  . None  Social History  Narrative   ** Merged History Encounter **       ** Merged History Encounter **       Family History  Problem Relation Age of Onset  . Stroke Other   . Cancer Other   . Hyperlipidemia Mother   . Hypertension Mother    Allergies  Allergen Reactions  . Codeine Hives, Itching, Swelling and Other (See Comments)    Does not impair breathing, however  . Penicillins Swelling and Other (See Comments)    Has patient had a PCN reaction causing immediate rash, facial/tongue/throat swelling, SOB or lightheadedness with hypotension: Yes Has patient had a PCN reaction causing severe rash involving mucus membranes or skin necrosis: Yes Has patient had a PCN reaction that required hospitalization Yes-ed visit Has patient had a PCN reaction occurring within the last 10 years: Yes If all of the above answers are "NO", then may proceed with Cephalosporin use.   Marland Kitchen Morphine Itching  . Coconut Flavor Itching and Swelling  . Coconut Oil Other (See Comments)    Cannot take with some of his meds  . Grapefruit Concentrate Other (See Comments)    Cannot take with some of his  meds  . Morphine And Related Itching and Swelling  . Oxycodone Itching and Swelling  . Norco [Hydrocodone-Acetaminophen] Itching and Rash   Prior to Admission medications   Medication Sig Start Date End Date Taking? Authorizing Provider  amLODipine (NORVASC) 5 MG tablet Take 1 tablet (5 mg total) by mouth daily. For high blood pressure 08/04/17  Yes Money, Lowry Ram, FNP  atorvastatin (LIPITOR) 20 MG tablet Take 1 tablet (20 mg total) by mouth daily at 6 PM. For high cholesterol 08/03/17  Yes Money, Lowry Ram, FNP  divalproex (DEPAKOTE) 250 MG DR tablet Take 1 tablet (250 mg total) by mouth 2 (two) times daily. For mood control 08/03/17  Yes Money, Lowry Ram, FNP  hydrOXYzine (ATARAX/VISTARIL) 25 MG tablet Take 1 tablet (25 mg total) by mouth 3 (three) times daily as needed for anxiety. 08/03/17  Yes Money, Lowry Ram, FNP  ibuprofen  (ADVIL,MOTRIN) 200 MG tablet Take 400 mg by mouth every 6 (six) hours as needed for headache or moderate pain.   Yes [provider]  traMADol (ULTRAM) 50 MG tablet Take 1 tablet (50 mg total) by mouth every 6 (six) hours as needed for moderate pain. 08/03/17  Yes Money, Lowry Ram, FNP  traZODone (DESYREL) 300 MG tablet Take 1 tablet (300 mg total) by mouth at bedtime. For sleep and mood 08/03/17  Yes Money, Lowry Ram, FNP  ziprasidone (GEODON) 20 MG capsule Take 1 capsule (20 mg total) by mouth 2 (two) times daily with a meal. For mood control 08/03/17  Yes Money, [name] B, FNP     Positive ROS: All other systems have been reviewed and were otherwise negative with the exception of those mentioned in the HPI and as above.  Physical Exam: General: Alert, no acute distress Cardiovascular: No pedal edema Respiratory: No cyanosis, no use of accessory musculature GI: abdomen soft Skin: No lesions in the area of chief complaint Neurologic: Sensation intact distally Psychiatric: Patient is competent for consent with normal mood and affect Lymphatic: no lymphedema  MUSCULOSKELETAL: exam stable  Assessment: left distal clavicle fracture  Plan: Plan for Procedure(s): OPEN REDUCTION INTERNAL FIXATION (ORIF) LEFT CLAVICLE FRACTURE WITH RECONSTRUCTION OF CORACOCLAVICULAR LIGAMENT RECONSTRUCTION OF CORACOCLAVICULAR LIGAMENT  The risks benefits and alternatives were discussed with the patient including but not limited to the risks of nonoperative treatment, versus surgical intervention including infection, bleeding, nerve injury,  blood clots, cardiopulmonary complications, morbidity, mortality, among others, and they were willing to proceed.   Eduard Roux, MD   08/10/2017 7:29 AM

## 2017-08-10 NOTE — Anesthesia Preprocedure Evaluation (Addendum)
Anesthesia Evaluation  Patient identified by MRN, date of birth, ID band Patient awake    Reviewed: Allergy & Precautions, NPO status , [name] Chart, lab work & pertinent test results  Airway Mallampati: I  TM Distance: >3 FB Neck ROM: Full    Dental  (+) Dental Advisory Given, Missing, Poor Dentition   Pulmonary former smoker,    Pulmonary exam normal breath sounds clear to auscultation       Cardiovascular hypertension, Pt. on medications + Peripheral Vascular Disease  (-) Past MI Normal cardiovascular exam Rhythm:Regular Rate:Normal     Neuro/Psych  Headaches, Seizures -,  PSYCHIATRIC DISORDERS Anxiety Depression Bipolar Disorder Schizophrenia    GI/Hepatic negative GI ROS, (+) Cirrhosis     substance abuse  alcohol use and cocaine use,   Endo/Other  negative endocrine ROSneg diabetes  Renal/GU negative Renal ROS  negative genitourinary   Musculoskeletal negative musculoskeletal ROS (+)   Abdominal   Peds  Hematology negative hematology ROS (+)   Anesthesia Other Findings   Reproductive/Obstetrics                            Anesthesia Physical Anesthesia Plan  ASA: III  Anesthesia Plan: General   Post-op Pain Management:    Induction: Intravenous  PONV Risk Score and Plan: 3 and Treatment may vary due to age or medical condition, Ondansetron, Midazolam and Diphenhydramine  Airway Management Planned: Oral ETT  Additional Equipment: None  Intra-op Plan:   Post-operative Plan: Extubation in OR  Informed Consent: I have reviewed the patients History and Physical, chart, labs and discussed the procedure including the risks, benefits and alternatives for the proposed anesthesia with the patient or authorized representative who has indicated his/her understanding and acceptance.   Dental advisory given  Plan Discussed with: CRNA  Anesthesia Plan Comments:         Anesthesia Quick Evaluation

## 2017-08-10 NOTE — Discharge Instructions (Signed)
Postoperative instructions: ° °Weightbearing instructions: non weight bearing ° °Keep your dressing and/or splint clean and dry at all times.  You can remove your dressing on post-operative day #3 and change with a dry/sterile dressing or Band-Aids as needed thereafter.   ° °Incision instructions:  Do not soak your incision for 3 weeks after surgery.  If the incision gets wet, pat dry and do not scrub the incision. ° °Pain control:  You have been given a prescription to be taken as directed for post-operative pain control.  In addition, elevate the operative extremity above the heart at all times to prevent swelling and throbbing pain. ° °Take over-the-counter Colace, 100mg by mouth twice a day while taking narcotic pain medications to help prevent constipation. ° °Follow up appointments: °1) 10-14 days for suture removal and wound check. °2) Dr. Daphane Odekirk as scheduled. ° ° ------------------------------------------------------------------------------------------------------------- ° °After Surgery Pain Control: ° °After your surgery, post-surgical discomfort or pain is likely. This discomfort can last several days to a few weeks. At certain times of the day your discomfort may be more intense.  °Did you receive a nerve block?  °A nerve block can provide pain relief for one hour to two days after your surgery. As long as the nerve block is working, you will experience little or no sensation in the area the surgeon operated on.  °As the nerve block wears off, you will begin to experience pain or discomfort. It is very important that you begin taking your prescribed pain medication before the nerve block fully wears off. Treating your pain at the first sign of the block wearing off will ensure your pain is better controlled and more tolerable when full-sensation returns. Do not wait until the pain is intolerable, as the medicine will be less effective. It is better to treat pain in advance than to try and catch up.  °General  Anesthesia:  °If you did not receive a nerve block during your surgery, you will need to start taking your pain medication shortly after your surgery and should continue to do so as prescribed by your surgeon.  °Pain Medication:  °Most commonly we prescribe Vicodin and Percocet for post-operative pain. Both of these medications contain a combination of acetaminophen (Tylenol®) and a narcotic to help control pain.  °· It takes between 30 and 45 minutes before pain medication starts to work. It is important to take your medication before your pain level gets too intense.  °· Nausea is a common side effect of many pain medications. You will want to eat something before taking your pain medicine to help prevent nausea.  °· If you are taking a prescription pain medication that contains acetaminophen, we recommend that you do not take additional over the counter acetaminophen (Tylenol®).  °Other pain relieving options:  °· Using a cold pack to ice the affected area a few times a day (15 to 20 minutes at a time) can help to relieve pain, reduce swelling and bruising.  °· Elevation of the affected area can also help to reduce pain and swelling. ° ° ° °

## 2017-08-11 ENCOUNTER — Encounter (HOSPITAL_COMMUNITY): Payer: Self-pay | Admitting: Orthopaedic Surgery

## 2017-08-11 NOTE — Anesthesia Postprocedure Evaluation (Signed)
Anesthesia Post Note  Patient: [name]  Procedure(s) Performed: OPEN REDUCTION INTERNAL FIXATION (ORIF) LEFT CLAVICLE FRACTURE WITH RECONSTRUCTION OF CORACOCLAVICULAR LIGAMENT (Left Shoulder) RECONSTRUCTION OF CORACOCLAVICULAR LIGAMENT (Left Shoulder)     Anesthesia Post Evaluation  Last Vitals:  Vitals:   08/10/17 1637 08/10/17 1645  BP: 130/86 (!) 136/91  Pulse: (!) 103 96  Resp: 18 14  Temp:  (!) 36.4 C  SpO2: 98% 100%    Last Pain:  Vitals:   08/10/17 1537  TempSrc:   PainSc: [place]

## 2017-08-29 ENCOUNTER — Ambulatory Visit (INDEPENDENT_AMBULATORY_CARE_PROVIDER_SITE_OTHER): Payer: Self-pay | Admitting: Orthopaedic Surgery

## 2017-08-30 ENCOUNTER — Ambulatory Visit (INDEPENDENT_AMBULATORY_CARE_PROVIDER_SITE_OTHER): Payer: Self-pay

## 2017-08-30 ENCOUNTER — Ambulatory Visit (INDEPENDENT_AMBULATORY_CARE_PROVIDER_SITE_OTHER): Payer: Self-pay | Admitting: Orthopaedic Surgery

## 2017-08-30 ENCOUNTER — Encounter (INDEPENDENT_AMBULATORY_CARE_PROVIDER_SITE_OTHER): Payer: Self-pay | Admitting: Orthopaedic Surgery

## 2017-08-30 DIAGNOSIS — S42032A Displaced fracture of lateral end of left clavicle, initial encounter for closed fracture: Secondary | ICD-10-CM

## 2017-08-30 NOTE — Progress Notes (Signed)
Post-Op Visit Note   Patient: [name]           Date of Birth: [birth year]           MRN: [id number] Visit Date: 08/30/2017 PCP: Patient, No Pcp Per   Assessment & Plan:  Chief Complaint:  Chief Complaint  Patient presents with  . Left Shoulder - Routine Post Op    CLAVICLE POST OP CHECK   Visit Diagnoses:  1. Displaced fracture of lateral end of left clavicle, initial encounter for closed fracture     Plan: [name] is a pleasant 37 year old that comes in for follow-up.  He is 20 days status post ORIF left clavicle fracture date of surgery 08/10/2017.  He has been compliant wearing a sling at all times.  Minimal pain which is being relieved with ibuprofen.  Examination of his left clavicle reveals a well-healing surgical incision without evidence of infection.  At this point, we will apply new Steri-Strips.  He will remain in his sling nonweightbearing for the next 3 weeks.  He he can start to come out of the sling for pendulum exercises only.  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  Sling must be worn to the left upper extremity at all times and no lifting with the left upper extremity.  He will follow-up with [place] in 3 weeks time for repeat evaluation and x-ray.  At that point we will likely transition him to formal therapy.  Follow-Up Instructions: Return in about 3 weeks (around 09/20/2017).   Orders:  Orders Placed This Encounter  Procedures  . XR Clavicle Left   No orders of the defined types were placed in this encounter.   Imaging: Xr Clavicle Left  Result Date: 08/30/2017 X-rays of the clavicle reveal stable alignment of the fracture.   PMFS History: Patient Active Problem List   Diagnosis Date Noted  . Displaced fracture of lateral end of left clavicle, initial encounter for closed fracture 08/05/2017  . Coracoclavicular (ligament) sprain and strain, left, initial encounter 08/05/2017  . Closed dislocation of acromioclavicular joint,  initial encounter 08/05/2017  . Insomnia   . HTN (hypertension), benign   . Severe episode of recurrent major depressive disorder, without psychotic features ([place])   . Depression, major, severe recurrence ([place]) 12/30/2015  . Substance induced mood disorder ([place]) 12/02/2015  . Mood disorder in conditions classified elsewhere   . MDD (major depressive disorder), recurrent severe, without psychosis ([place]) 10/08/2015  . Malnutrition of moderate degree 09/24/2015  . Alcohol withdrawal ([place]) 09/23/2015  . Tobacco use disorder 07/16/2015  . Elevated transaminase level   . Drug overdose, intentional ([place]) 07/12/2015  . Alcohol use disorder, severe, dependence ([place]) 04/15/2015  . Cocaine abuse with cocaine-induced mood disorder ([place]) 04/11/2015  . Overdose 04/10/2015  . Severe recurrent major depressive disorder with psychotic features ([place])   . Severe major depression with psychotic features ([place]) 09/11/2014  . Alcohol-induced mood disorder ([place]) 09/10/2014  . Suicidal ideation   . Tylenol overdose   . Polysubstance abuse ([place])   . Overdose of acetaminophen 08/03/2014  . Overdose by acetaminophen 08/03/2014  . Cocaine abuse ([place]) 04/16/2014  . Thrombocytopenia ([place]) 04/15/2014  . Urinary tract infection, site not specified 04/15/2014  . Chest pain, unspecified 04/15/2014  . D-dimer, elevated 04/15/2014  . Transaminitis 09/24/2013  . Cocaine dependence ([place]) 09/20/2013  . S/p nephrectomy 04/28/2013  . Seizure ([place]) 03/15/2013  . Syncope 01/02/2013  . Leukocytopenia, unspecified 01/02/2013  .  Left kidney mass 12/24/2012  . PTSD (post-traumatic stress disorder) 07/06/2012  . Peripheral vascular disease ([place]) 01/14/2012  . SEIZURE DISORDER 10/03/2008  . LIVER FUNCTION TESTS, ABNORMAL 12/29/2007  . HYPERCHOLESTEROLEMIA 03/21/2007  . Essential hypertension 03/21/2007   Past Medical History:  Diagnosis Date  . Angina   . Anxiety    panic attack  . Bipolar 1 disorder ([place])   . Breast  CA ([place]) dx'd 2009   bil w/ bil masectomy and oral meds  . Cancer [facility])    kidney cancer  . Coronary artery disease   . Depression   . H/O suicide attempt 2015   overdose  . Headache(784.0)   . Hypercholesteremia   . Hypertension   . Liver cirrhosis ([place])   . Pancreatitis   . Peripheral vascular disease [facility]) April 2011   Left Pop  . Schizophrenia ([place])   . Seizures ([place])    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  . Shortness of breath     Family History  Problem Relation Age of Onset  . Stroke Other   . Cancer Other   . Hyperlipidemia Mother   . Hypertension Mother     Past Surgical History:  Procedure Laterality Date  . BREAST SURGERY    . BREAST SURGERY     bilateral breast silocone  removal  . CHEST SURGERY    . left kidney removal    . left leg surgery     "popiteal artery clogged"  . MASTECTOMY Bilateral   . NEPHRECTOMY Left   . ORIF CLAVICULAR FRACTURE Left 08/10/2017   Procedure: OPEN REDUCTION INTERNAL FIXATION (ORIF) LEFT CLAVICLE FRACTURE WITH RECONSTRUCTION OF CORACOCLAVICULAR LIGAMENT;  Surgeon: Leandrew Koyanagi, MD;  Location: [place];  Service: Orthopedics;  Laterality: Left;  . RECONSTRUCTION OF CORACOCLAVICULAR LIGAMENT Left 08/10/2017   Procedure: RECONSTRUCTION OF CORACOCLAVICULAR LIGAMENT;  Surgeon: Leandrew Koyanagi, MD;  Location: [facility];  Service: Orthopedics;  Laterality: Left;   Social History   Occupational History  . Not on file  Tobacco Use  . Smoking status: Former Research scientist (life sciences)  . Smokeless tobacco: Never Used  . Tobacco comment: smoked on and off 16 years- quit 2016  Substance and Sexual Activity  . Alcohol use: Yes    Comment: 08/08/17- No alcohol since 07/26/17  . Drug use: No    Comment: 08/08/2017- no cocaine for 1 month  . Sexual activity: Yes    Birth control/protection: Condom    Comment: anal

## 2017-09-15 IMAGING — CR DG CHEST 2V
2 series · 2 of 2 positions shown · non-contrast
Comparison: 01/17/2015.

CLINICAL DATA: Alcohol intoxication. Mid chest pain and shortness
of breath. Productive cough.

EXAM:
CHEST  2 VIEW

[w chest lat]
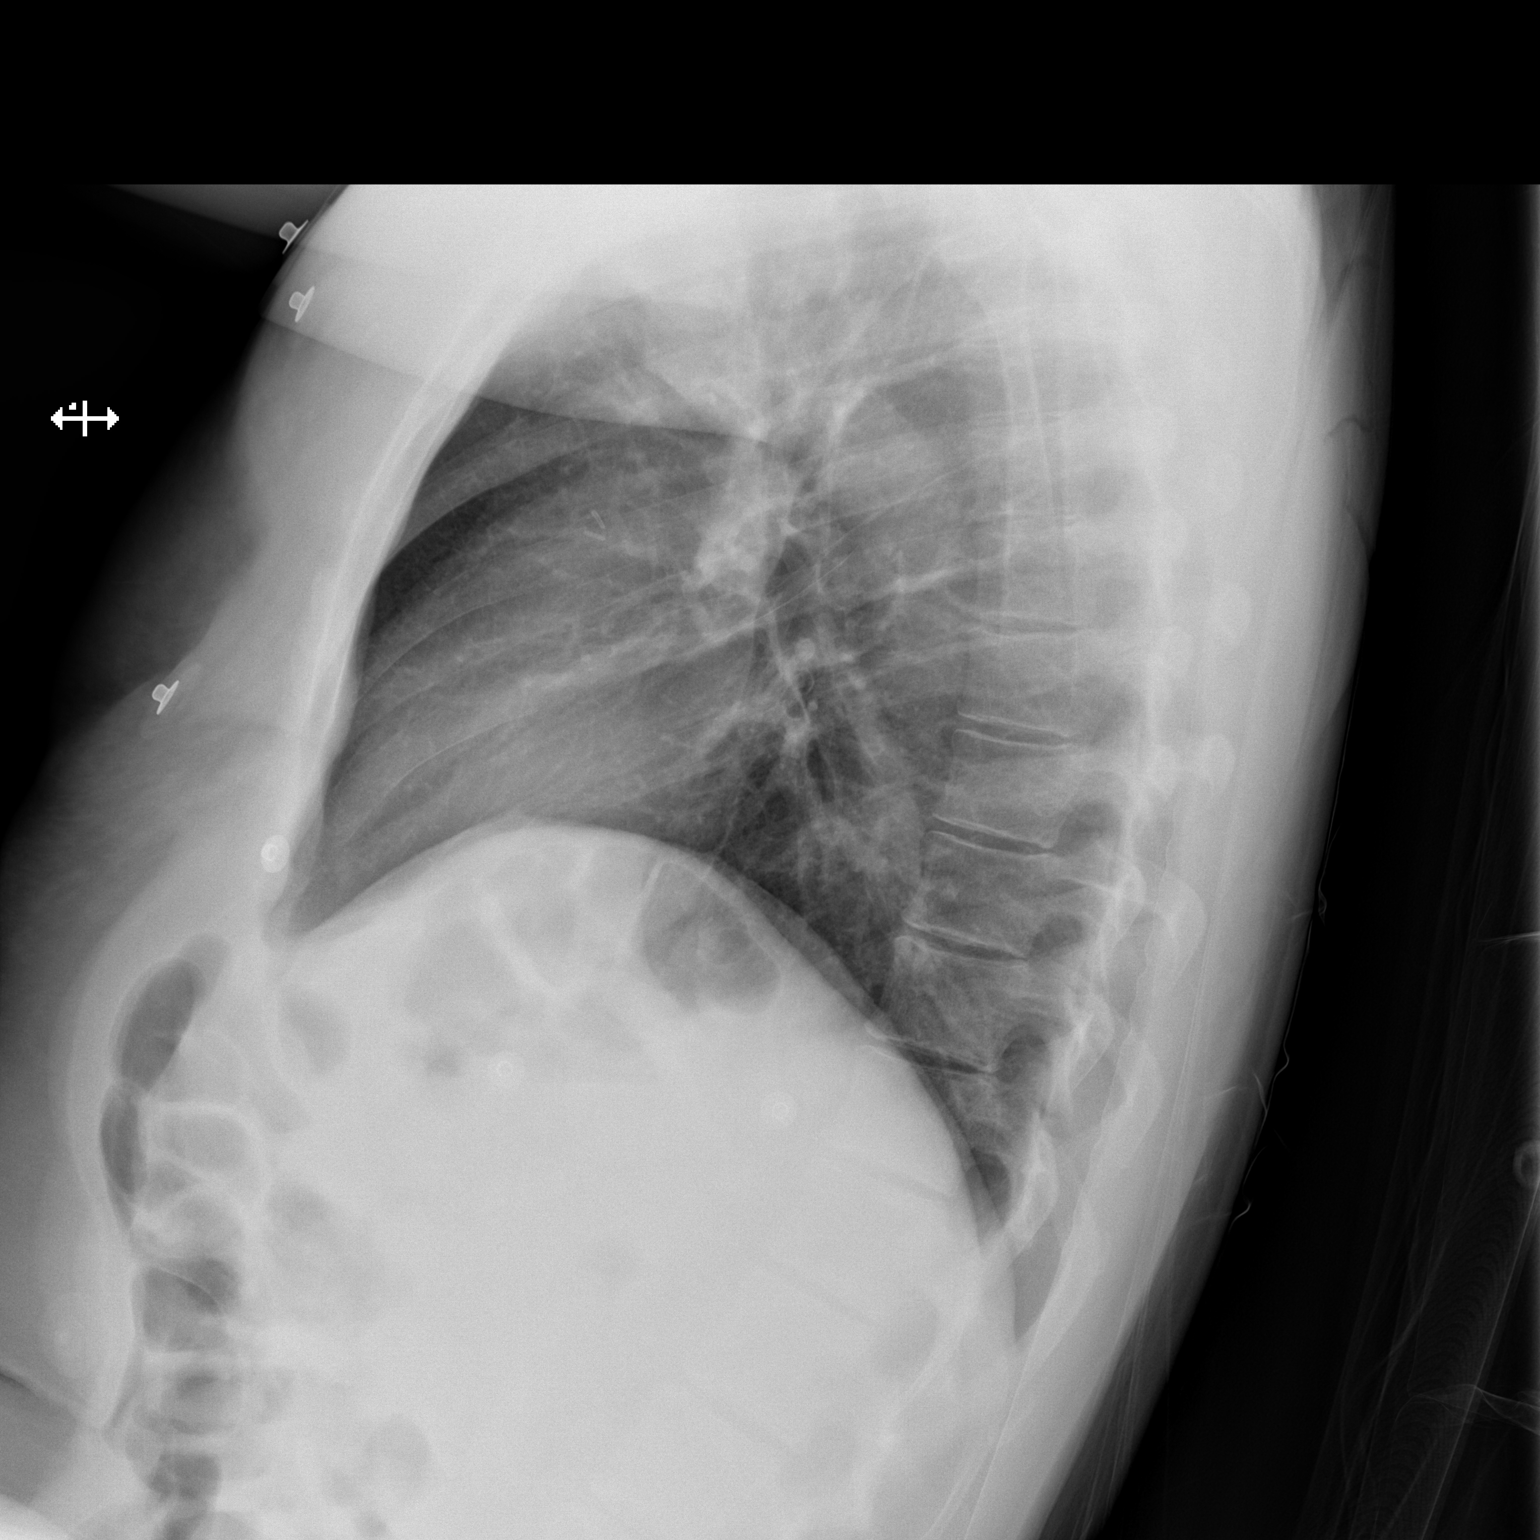

[x chest ap]
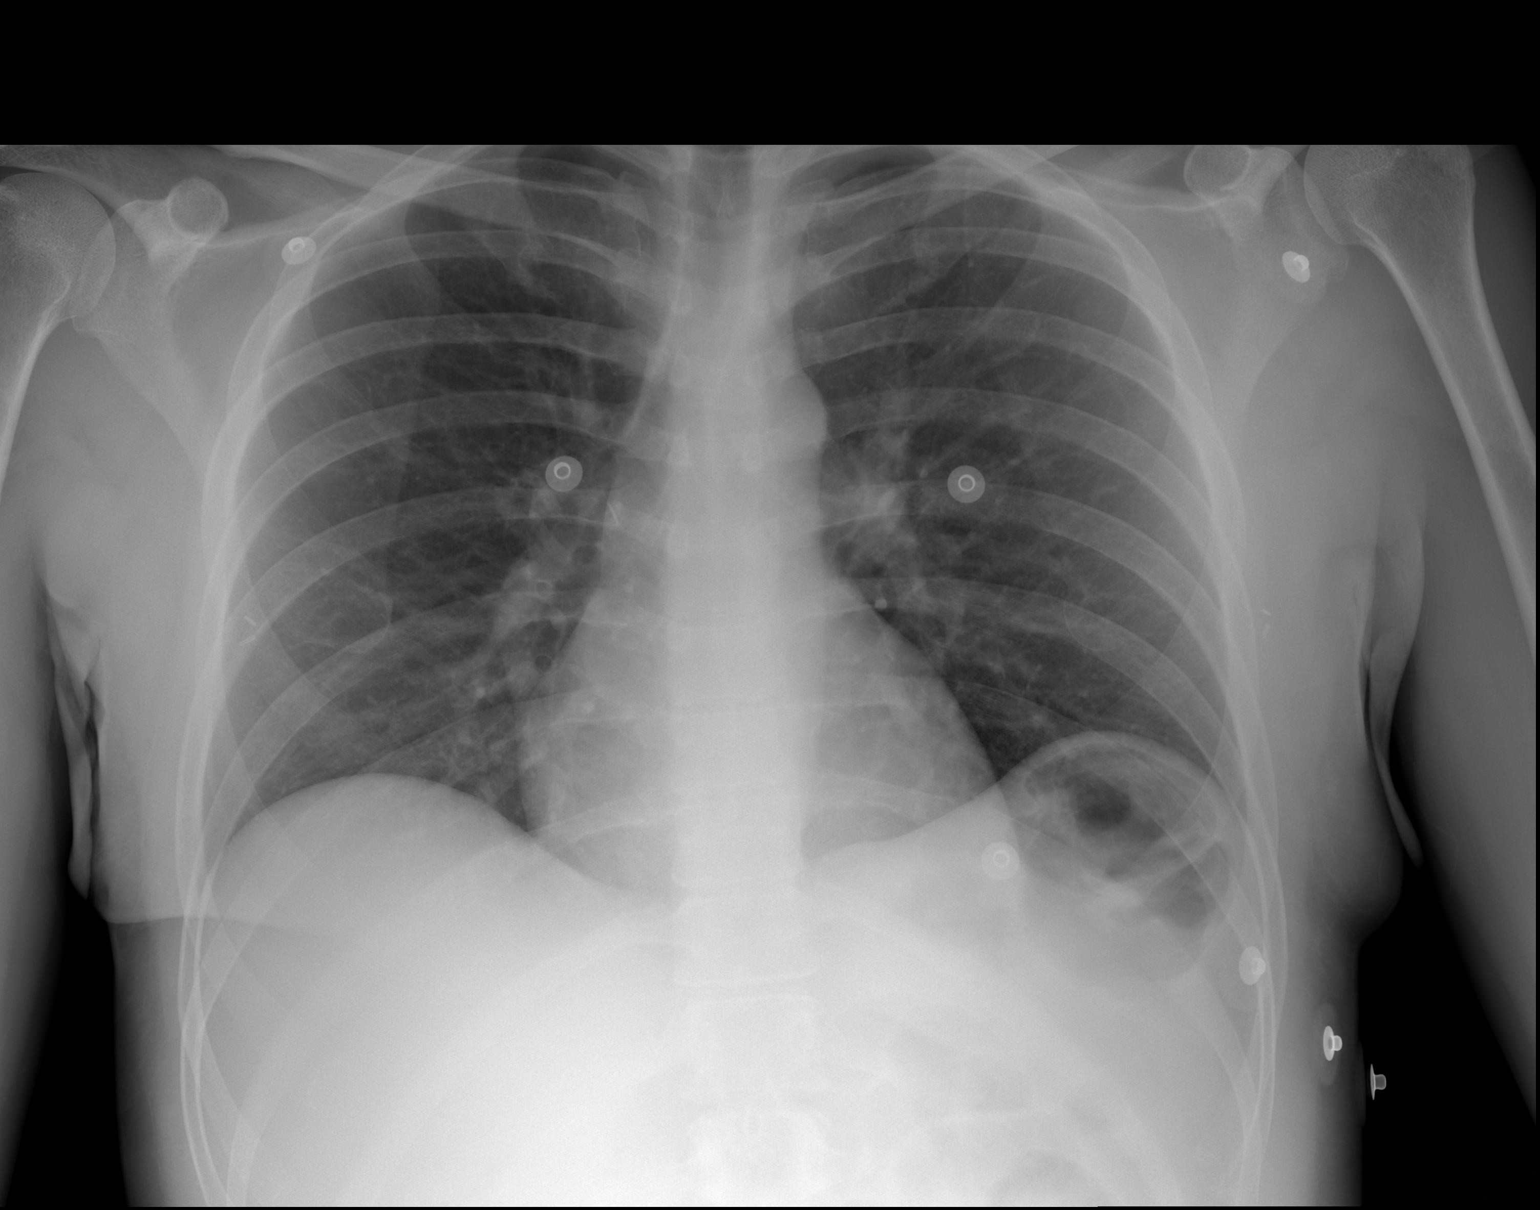

[2 of 2 positions shown; findings below may reference images not displayed]

FINDINGS: Normal sized heart. Clear lungs. Mild central peribronchial
thickening. Bilateral surgical clips. Unremarkable bones.
IMPRESSION: Mild bronchitic changes.

## 2017-09-20 ENCOUNTER — Encounter (INDEPENDENT_AMBULATORY_CARE_PROVIDER_SITE_OTHER): Payer: Self-pay | Admitting: Orthopaedic Surgery

## 2017-09-20 ENCOUNTER — Ambulatory Visit (INDEPENDENT_AMBULATORY_CARE_PROVIDER_SITE_OTHER): Payer: Self-pay

## 2017-09-20 ENCOUNTER — Ambulatory Visit (INDEPENDENT_AMBULATORY_CARE_PROVIDER_SITE_OTHER): Payer: Self-pay | Admitting: Orthopaedic Surgery

## 2017-09-20 DIAGNOSIS — S42032A Displaced fracture of lateral end of left clavicle, initial encounter for closed fracture: Secondary | ICD-10-CM

## 2017-09-20 DIAGNOSIS — G8929 Other chronic pain: Secondary | ICD-10-CM

## 2017-09-20 DIAGNOSIS — S43109A Unspecified dislocation of unspecified acromioclavicular joint, initial encounter: Secondary | ICD-10-CM

## 2017-09-20 DIAGNOSIS — M25512 Pain in left shoulder: Secondary | ICD-10-CM

## 2017-09-20 NOTE — Progress Notes (Signed)
Post-Op Visit Note   Patient: [name]           Date of Birth: [birth year]           MRN: [id number] Visit Date: 09/20/2017 PCP: Patient, No Pcp Per   Assessment & Plan:  Chief Complaint:  Chief Complaint  Patient presents with  . Left Shoulder - Pain, Follow-up   Visit Diagnoses:  1. Chronic left shoulder pain   2. Displaced fracture of lateral end of left clavicle, initial encounter for closed fracture   3. Closed dislocation of acromioclavicular joint, initial encounter     Plan: Jackey comes in for follow-up.  41 days status post ORIF left clavicle fracture with CC ligament reconstruction, date of surgery 08/10/2017.  He has been doing well.  He has been in his sling at all times.  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  Minimal pain.  Examination of the left clavicle reveals a well-healed surgical incision without evidence of infection.  Minimal tenderness to palpation.  He is neurovascular intact distally.  At this point, we will have him DC his sling.  We are going to have him start outpatient physical therapy to work on active range of motion as tolerated as well as strengthening.   we will limit his lifting to no more than 15-20 pounds.  A prescription was given for this.  He will follow-up with [place] in 6 weeks time for repeat evaluation and 2 view x-rays of the clavicle.  He will call with concerns or questions in the meantime.  Follow-Up Instructions: Return in about 6 weeks (around 11/01/2017).   Orders:  Orders Placed This Encounter  Procedures  . XR Clavicle Left   No orders of the defined types were placed in this encounter.   Imaging: No results found.  PMFS History: Patient Active Problem List   Diagnosis Date Noted  . Displaced fracture of lateral end of left clavicle, initial encounter for closed fracture 08/05/2017  . Coracoclavicular (ligament) sprain and strain, left, initial encounter 08/05/2017  . Closed dislocation of  acromioclavicular joint, initial encounter 08/05/2017  . Insomnia   . HTN (hypertension), benign   . Severe episode of recurrent major depressive disorder, without psychotic features ([place])   . Depression, major, severe recurrence ([place]) 12/30/2015  . Substance induced mood disorder ([place]) 12/02/2015  . Mood disorder in conditions classified elsewhere   . MDD (major depressive disorder), recurrent severe, without psychosis ([place]) 10/08/2015  . Malnutrition of moderate degree 09/24/2015  . Alcohol withdrawal ([place]) 09/23/2015  . Tobacco use disorder 07/16/2015  . Elevated transaminase level   . Drug overdose, intentional ([place]) 07/12/2015  . Alcohol use disorder, severe, dependence ([place]) 04/15/2015  . Cocaine abuse with cocaine-induced mood disorder ([place]) 04/11/2015  . Overdose 04/10/2015  . Severe recurrent major depressive disorder with psychotic features ([place])   . Severe major depression with psychotic features ([place]) 09/11/2014  . Alcohol-induced mood disorder ([place]) 09/10/2014  . Suicidal ideation   . Tylenol overdose   . Polysubstance abuse ([place])   . Overdose of acetaminophen 08/03/2014  . Overdose by acetaminophen 08/03/2014  . Cocaine abuse ([place]) 04/16/2014  . Thrombocytopenia ([place]) 04/15/2014  . Urinary tract infection, site not specified 04/15/2014  . Chest pain, unspecified 04/15/2014  . D-dimer, elevated 04/15/2014  . Transaminitis 09/24/2013  . Cocaine dependence ([place]) 09/20/2013  . S/p nephrectomy 04/28/2013  . Seizure ([place]) 03/15/2013  . Syncope 01/02/2013  . Leukocytopenia,  unspecified 01/02/2013  . Left kidney mass 12/24/2012  . PTSD (post-traumatic stress disorder) 07/06/2012  . Peripheral vascular disease ([place]) 01/14/2012  . SEIZURE DISORDER 10/03/2008  . LIVER FUNCTION TESTS, ABNORMAL 12/29/2007  . HYPERCHOLESTEROLEMIA 03/21/2007  . Essential hypertension 03/21/2007   Past Medical History:  Diagnosis Date  . Angina   . Anxiety    panic attack  . Bipolar 1  disorder ([place])   . Breast CA ([place]) dx'd 2009   bil w/ bil masectomy and oral meds  . Cancer [facility])    kidney cancer  . Coronary artery disease   . Depression   . H/O suicide attempt 2015   overdose  . Headache(784.0)   . Hypercholesteremia   . Hypertension   . Liver cirrhosis ([place])   . Pancreatitis   . Peripheral vascular disease [facility]) April 2011   Left Pop  . Schizophrenia ([place])   . Seizures ([place])    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  . Shortness of breath     Family History  Problem Relation Age of Onset  . Stroke Other   . Cancer Other   . Hyperlipidemia Mother   . Hypertension Mother     Past Surgical History:  Procedure Laterality Date  . BREAST SURGERY    . BREAST SURGERY     bilateral breast silocone  removal  . CHEST SURGERY    . left kidney removal    . left leg surgery     "popiteal artery clogged"  . MASTECTOMY Bilateral   . NEPHRECTOMY Left   . ORIF CLAVICULAR FRACTURE Left 08/10/2017   Procedure: OPEN REDUCTION INTERNAL FIXATION (ORIF) LEFT CLAVICLE FRACTURE WITH RECONSTRUCTION OF CORACOCLAVICULAR LIGAMENT;  Surgeon: Leandrew Koyanagi, MD;  Location: [place] [facility];  Service: Orthopedics;  Laterality: Left;  . RECONSTRUCTION OF CORACOCLAVICULAR LIGAMENT Left 08/10/2017   Procedure: RECONSTRUCTION OF CORACOCLAVICULAR LIGAMENT;  Surgeon: Leandrew Koyanagi, MD;  Location: [place];  Service: Orthopedics;  Laterality: Left;   Social History   Occupational History  . Not on file  Tobacco Use  . Smoking status: Former Research scientist (life sciences)  . Smokeless tobacco: Never Used  . Tobacco comment: smoked on and off 16 years- quit 2016  Substance and Sexual Activity  . Alcohol use: Yes    Comment: 08/08/17- No alcohol since 07/26/17  . Drug use: No    Comment: 08/08/2017- no cocaine for 1 month  . Sexual activity: Yes    Birth control/protection: Condom    Comment: anal

## 2017-09-27 ENCOUNTER — Ambulatory Visit: Payer: Self-pay | Attending: Orthopaedic Surgery | Admitting: Physical Therapy

## 2017-09-27 ENCOUNTER — Encounter: Payer: Self-pay | Admitting: Physical Therapy

## 2017-09-27 DIAGNOSIS — M25512 Pain in left shoulder: Secondary | ICD-10-CM

## 2017-09-27 DIAGNOSIS — M25612 Stiffness of left shoulder, not elsewhere classified: Secondary | ICD-10-CM

## 2017-09-27 DIAGNOSIS — M6281 Muscle weakness (generalized): Secondary | ICD-10-CM

## 2017-09-28 ENCOUNTER — Encounter: Payer: Self-pay | Admitting: Physical Therapy

## 2017-09-28 NOTE — Therapy (Addendum)
[place], [place], [address] Phone: [phone]   Fax:  [phone]  Physical Therapy Evaluation/ Discharge   Patient Details  Name: [name] MRN: [id number] Date of Birth: January 17, 1981 Referring Provider: Dr Frankey Shown    Encounter Date: 09/27/2017  PT End of Session - 09/28/17 1250    Visit Number  1    Number of Visits  12    Authorization Type  Self pay     PT Start Time  1630    PT Stop Time  1712    PT Time Calculation (min)  42 min    Activity Tolerance  Patient tolerated treatment well    Behavior During Therapy  [facility] for tasks assessed/performed       Past Medical History:  Diagnosis Date  . Angina   . Anxiety    panic attack  . Bipolar 1 disorder ([place])   . Breast CA ([place]) dx'd 2009   bil w/ bil masectomy and oral meds  . Cancer [facility])    kidney cancer  . Coronary artery disease   . Depression   . H/O suicide attempt 2015   overdose  . Headache(784.0)   . Hypercholesteremia   . Hypertension   . Liver cirrhosis ([place])   . Pancreatitis   . Peripheral vascular disease [facility]) April 2011   Left Pop  . Schizophrenia ([place])   . Seizures ([place])    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  . Shortness of breath     Past Surgical History:  Procedure Laterality Date  . BREAST SURGERY    . BREAST SURGERY     bilateral breast silocone  removal  . CHEST SURGERY    . left kidney removal    . left leg surgery     "popiteal artery clogged"  . MASTECTOMY Bilateral   . NEPHRECTOMY Left   . ORIF CLAVICULAR FRACTURE Left 08/10/2017   Procedure: OPEN REDUCTION INTERNAL FIXATION (ORIF) LEFT CLAVICLE FRACTURE WITH RECONSTRUCTION OF CORACOCLAVICULAR LIGAMENT;  Surgeon: Leandrew Koyanagi, MD;  Location: [place];  Service: Orthopedics;  Laterality: Left;  . RECONSTRUCTION OF CORACOCLAVICULAR LIGAMENT Left 08/10/2017   Procedure: RECONSTRUCTION OF CORACOCLAVICULAR LIGAMENT;  Surgeon: Leandrew Koyanagi, MD;  Location:  [place];  Service: Orthopedics;  Laterality: Left;    There were no vitals filed for this visit.   Subjective Assessment - 09/27/17 1639    Subjective  Patient had an ORIF of the left collar bone on 08/10/2017. Since that point he feels like he is improving. He has been out of the sling for a week. He feels most of his pain at night,     Limitations  Lifting    Diagnostic tests  X-ray: stable healing of fracture 3/5     Patient Stated Goals  Be able to use his arm again and improved pain     Currently in Pain?  Yes    Pain Score  2  Pain can reach a 6/10     Pain Location  Shoulder    Pain Orientation  Left    Pain Descriptors / Indicators  Aching    Pain Type  Acute pain    Pain Onset  More than a month ago    Pain Frequency  Constant    Aggravating Factors   use of the shoulder, sleeping on the shoulder     Pain Relieving Factors  rest, ibuprofin     Effect of Pain on Daily  Activities  difficulty using his left arm for daily activity          [facility] PT Assessment - 09/28/17 0001      Assessment   Medical Diagnosis  Left Clavicle fx     Onset Date/Surgical Date  08/10/17    Hand Dominance  Right    Prior Therapy  None       Precautions   Precautions  None      Restrictions   Weight Bearing Restrictions  No      Balance Screen   Has the patient fallen in the past 6 months  No    Has the patient had a decrease in activity level because of a fear of falling?   No    Is the patient reluctant to leave their home because of a fear of falling?   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       Cognition   Overall Cognitive Status  Within Functional Limits for tasks assessed    Attention  Focused    Focused Attention  Appears intact    Memory   Appears intact    Awareness  Appears intact    Problem Solving  Appears intact      Sensation   Light Touch  Appears Intact    Additional Comments  denies parathesias       Coordination   Gross Motor Movements are Fluid and Coordinated  Yes    Fine Motor Movements are Fluid and Coordinated  Yes      Posture/Postural Control   Posture Comments  slight rounded shoulders but otherwise good       AROM   Left Shoulder Flexion  115 Degrees    Left Shoulder Internal Rotation  -- can reach to L3 but with pain     Left Shoulder External Rotation  -- cn reach behind his head with slight cervical flexion       PROM   Left Shoulder Flexion  115 Degrees    Left Shoulder ABduction  90 Degrees    Left Shoulder Internal Rotation  70 Degrees    Left Shoulder External Rotation  55 Degrees      Strength   Left Shoulder Flexion  4/5    Left Shoulder Internal Rotation  4/5    Left Shoulder External Rotation  4/5      Palpation   Palpation comment  No unexpected tender             Objective measurements completed on examination: See above findings.      [place] [facility] PT Treatment/Exercise - 09/28/17 0001      Shoulder Exercises: Supine   Other Supine Exercises  wand flexion x10; wand ER x10 ;       Shoulder Exercises: Standing   Extension Limitations  2x10 yellow     Row Limitations  2x10 yellow     Other Standing Exercises  wand IR x10       Manual Therapy   Manual therapy comments  PROM into flexion and ER              PT Education - 09/27/17 7846  Education provided  Yes    Education Details  HEP; symptom mangement; activity progression     Person(s) Educated  Patient    Methods  Tactile cues;Demonstration;Verbal cues;Explanation    Comprehension  Verbalized understanding;Returned demonstration;Verbal cues required;Tactile cues required       PT Short Term Goals - 09/28/17 1304      PT SHORT TERM GOAL #1   Title  Patient will be independent with basic HEP     Time  4    Period  Weeks    Status  New    Target Date  10/26/17      PT SHORT TERM GOAL #2   Title  Patient will increase passive shoulder flexion to 150     Time  4    Period  Weeks    Status  New    Target Date  10/26/17      PT SHORT TERM GOAL #3   Title  Patient will demostrate 70 degrees of right passive shoulder external rotation     Time  4    Period  Weeks    Status  New        PT Long Term Goals - 09/28/17 1306      PT LONG TERM GOAL #1   Title  Patient will dmeostrate a 27% limiation on foto     Time  6    Period  Weeks    Status  New    Target Date  11/09/17      PT LONG TERM GOAL #2   Title  Patient will reach up to a shelf with 2 lb weight without pain in order to perfrom IADL's     Time  6    Period  Weeks    Status  New    Target Date  11/09/17      PT LONG TERM GOAL #3   Title  Patient will reach behind her back to L2 without pain in order perfrom hygeine tasks     Time  6    Period  Weeks    Status  New    Target Date  11/09/17             Plan - 09/28/17 1259    Clinical Impression Statement  Patient is a 37 year old male S/P clavicle fx and repair. He is currently out of the sling. He presnts with expected limitations with motion, strength and use of the shoulder. His shoulder is moving well considering his fracture. He would benefit from skilled therapy to improve the use of his shoulder. At this time he does not have insurance so his visits may be limited.     History and Personal Factors relevant to plan of care:  depression,     Clinical Presentation  Stable    Clinical Presentation due to:  Improving pain levels     Clinical Decision Making  Low    Rehab Potential  Good    PT Frequency  2x / week    PT Duration  8 weeks    PT Treatment/Interventions  ADLs/Self Care Home Management;Cryotherapy;Electrical Stimulation;Ultrasound;Iontophoresis 63m/ml Dexamethasone;Therapeutic activities;Therapeutic exercise;Patient/family  education;Neuromuscular re-education;Manual techniques;Passive range of motion;Taping;Splinting    PT Next Visit Plan  Manual therapy to improve ROM; add t-band IR/ ER; consider sidelying ER IR; consider rythmic stabs     PT Home Exercise Plan  wand 3 way; t band extesnion and retraction     Consulted and Agree with Plan of  Care  Patient       Patient will benefit from skilled therapeutic intervention in order to improve the following deficits and impairments:  Pain, Decreased strength, Decreased range of motion, Decreased activity tolerance, Decreased endurance, Impaired UE functional use  Visit Diagnosis: Acute pain of left shoulder  Stiffness of left shoulder, not elsewhere classified  Muscle weakness (generalized)   PHYSICAL THERAPY DISCHARGE SUMMARY  Visits from Start of Care: 1  Current functional level related to goals / functional outcomes: Unknown    Remaining deficits: Unknown    Education / Equipment: Unknown   Plan: Patient agrees to discharge.  Patient goals were not met. Patient is being discharged due to not returning since the last visit.  ?????       Problem List Patient Active Problem List   Diagnosis Date Noted  . Displaced fracture of lateral end of left clavicle, initial encounter for closed fracture 08/05/2017  . Coracoclavicular (ligament) sprain and strain, left, initial encounter 08/05/2017  . Closed dislocation of acromioclavicular joint, initial encounter 08/05/2017  . Insomnia   . HTN (hypertension), benign   . Severe episode of recurrent major depressive disorder, without psychotic features ([place])   . Depression, major, severe recurrence ([place]) 12/30/2015  . Substance induced mood disorder ([place]) 12/02/2015  . Mood disorder in conditions classified elsewhere   . MDD (major depressive disorder), recurrent severe, without psychosis ([place]) 10/08/2015  . Malnutrition of moderate degree 09/24/2015  . Alcohol withdrawal ([place]) 09/23/2015  . Tobacco  use disorder 07/16/2015  . Elevated transaminase level   . Drug overdose, intentional ([place]) 07/12/2015  . Alcohol use disorder, severe, dependence ([place]) 04/15/2015  . Cocaine abuse with cocaine-induced mood disorder ([employer]) 04/11/2015  . Overdose 04/10/2015  . Severe recurrent major depressive disorder with psychotic features ([place])   . Severe major depression with psychotic features ([place]) 09/11/2014  . Alcohol-induced mood disorder ([place]) 09/10/2014  . Suicidal ideation   . Tylenol overdose   . Polysubstance abuse ([place])   . Overdose of acetaminophen 08/03/2014  . Overdose by acetaminophen 08/03/2014  . Cocaine abuse ([place]) 04/16/2014  . Thrombocytopenia ([place]) 04/15/2014  . Urinary tract infection, site not specified 04/15/2014  . Chest pain, unspecified 04/15/2014  . D-dimer, elevated 04/15/2014  . Transaminitis 09/24/2013  . Cocaine dependence ([place]) 09/20/2013  . S/p nephrectomy 04/28/2013  . Seizure ([place]) 03/15/2013  . Syncope 01/02/2013  . Leukocytopenia, unspecified 01/02/2013  . Left kidney mass 12/24/2012  . PTSD (post-traumatic stress disorder) 07/06/2012  . Peripheral vascular disease ([place]) 01/14/2012  . SEIZURE DISORDER 10/03/2008  . LIVER FUNCTION TESTS, ABNORMAL 12/29/2007  . HYPERCHOLESTEROLEMIA 03/21/2007  . Essential hypertension 03/21/2007    Carney Living PT DPT  09/28/2017, 1:10 PM 11/10/2017 08:28 [place], [place], [address] Phone: [phone]   Fax:  [phone]  Name: [name] MRN: [id number] Date of Birth: 23-Apr-1981

## 2017-10-19 ENCOUNTER — Encounter (HOSPITAL_COMMUNITY): Payer: Self-pay

## 2017-10-19 ENCOUNTER — Inpatient Hospital Stay (HOSPITAL_COMMUNITY)
Admission: EM | Admit: 2017-10-19 | Discharge: 2017-10-26 | DRG: 918 | Disposition: A | Discharge status: Home / Self Care | Payer: Self-pay | Attending: Family Medicine | Admitting: Family Medicine

## 2017-10-19 ENCOUNTER — Emergency Department (HOSPITAL_COMMUNITY): Payer: Self-pay

## 2017-10-19 DIAGNOSIS — F339 Major depressive disorder, recurrent, unspecified: Secondary | ICD-10-CM

## 2017-10-19 DIAGNOSIS — Y92002 Bathroom of unspecified non-institutional (private) residence single-family (private) house as the place of occurrence of the external cause: Secondary | ICD-10-CM

## 2017-10-19 DIAGNOSIS — Z885 Allergy status to narcotic agent status: Secondary | ICD-10-CM

## 2017-10-19 DIAGNOSIS — I959 Hypotension, unspecified: Secondary | ICD-10-CM | POA: Diagnosis not present

## 2017-10-19 DIAGNOSIS — W1830XA Fall on same level, unspecified, initial encounter: Secondary | ICD-10-CM | POA: Diagnosis present

## 2017-10-19 DIAGNOSIS — Z9013 Acquired absence of bilateral breasts and nipples: Secondary | ICD-10-CM

## 2017-10-19 DIAGNOSIS — R109 Unspecified abdominal pain: Secondary | ICD-10-CM

## 2017-10-19 DIAGNOSIS — Z79899 Other long term (current) drug therapy: Secondary | ICD-10-CM

## 2017-10-19 DIAGNOSIS — Z87891 Personal history of nicotine dependence: Secondary | ICD-10-CM

## 2017-10-19 DIAGNOSIS — E872 Acidosis: Secondary | ICD-10-CM | POA: Diagnosis present

## 2017-10-19 DIAGNOSIS — Z853 Personal history of malignant neoplasm of breast: Secondary | ICD-10-CM

## 2017-10-19 DIAGNOSIS — F1090 Alcohol use, unspecified, uncomplicated: Secondary | ICD-10-CM

## 2017-10-19 DIAGNOSIS — D696 Thrombocytopenia, unspecified: Secondary | ICD-10-CM | POA: Diagnosis not present

## 2017-10-19 DIAGNOSIS — Z91018 Allergy to other foods: Secondary | ICD-10-CM

## 2017-10-19 DIAGNOSIS — I10 Essential (primary) hypertension: Secondary | ICD-10-CM | POA: Diagnosis present

## 2017-10-19 DIAGNOSIS — R945 Abnormal results of liver function studies: Secondary | ICD-10-CM

## 2017-10-19 DIAGNOSIS — I2510 Atherosclerotic heart disease of native coronary artery without angina pectoris: Secondary | ICD-10-CM | POA: Diagnosis present

## 2017-10-19 DIAGNOSIS — F10229 Alcohol dependence with intoxication, unspecified: Secondary | ICD-10-CM | POA: Diagnosis present

## 2017-10-19 DIAGNOSIS — K7010 Alcoholic hepatitis without ascites: Secondary | ICD-10-CM | POA: Diagnosis present

## 2017-10-19 DIAGNOSIS — L299 Pruritus, unspecified: Secondary | ICD-10-CM | POA: Diagnosis present

## 2017-10-19 DIAGNOSIS — Z915 Personal history of self-harm: Secondary | ICD-10-CM

## 2017-10-19 DIAGNOSIS — R1110 Vomiting, unspecified: Secondary | ICD-10-CM | POA: Diagnosis present

## 2017-10-19 DIAGNOSIS — F1010 Alcohol abuse, uncomplicated: Secondary | ICD-10-CM

## 2017-10-19 DIAGNOSIS — T50902A Poisoning by unspecified drugs, medicaments and biological substances, intentional self-harm, initial encounter: Principal | ICD-10-CM | POA: Diagnosis present

## 2017-10-19 DIAGNOSIS — R7989 Other specified abnormal findings of blood chemistry: Secondary | ICD-10-CM

## 2017-10-19 DIAGNOSIS — Z880 Allergy status to penicillin: Secondary | ICD-10-CM

## 2017-10-19 DIAGNOSIS — Z905 Acquired absence of kidney: Secondary | ICD-10-CM

## 2017-10-19 DIAGNOSIS — F1024 Alcohol dependence with alcohol-induced mood disorder: Secondary | ICD-10-CM | POA: Diagnosis present

## 2017-10-19 DIAGNOSIS — Z008 Encounter for other general examination: Secondary | ICD-10-CM

## 2017-10-19 DIAGNOSIS — F10239 Alcohol dependence with withdrawal, unspecified: Secondary | ICD-10-CM | POA: Diagnosis present

## 2017-10-19 DIAGNOSIS — E7800 Pure hypercholesterolemia, unspecified: Secondary | ICD-10-CM | POA: Diagnosis present

## 2017-10-19 DIAGNOSIS — F1410 Cocaine abuse, uncomplicated: Secondary | ICD-10-CM | POA: Diagnosis present

## 2017-10-19 DIAGNOSIS — F332 Major depressive disorder, recurrent severe without psychotic features: Secondary | ICD-10-CM | POA: Diagnosis present

## 2017-10-19 DIAGNOSIS — G4700 Insomnia, unspecified: Secondary | ICD-10-CM | POA: Diagnosis present

## 2017-10-19 LAB — COMPREHENSIVE METABOLIC PANEL
ALBUMIN: 4.1 g/dL (ref 3.5–5.0)
ALT: 162 U/L — ABNORMAL HIGH (ref 17–63)
AST: 266 U/L — ABNORMAL HIGH (ref 15–41)
Alkaline Phosphatase: 92 U/L (ref 38–126)
Anion gap: 20 — ABNORMAL HIGH (ref 5–15)
BUN: 11 mg/dL (ref 6–20)
CHLORIDE: 98 mmol/L — AB (ref 101–111)
CO2: 18 mmol/L — AB (ref 22–32)
Calcium: 8.4 mg/dL — ABNORMAL LOW (ref 8.9–10.3)
Creatinine, Ser: 0.88 mg/dL (ref 0.61–1.24)
GFR calc Af Amer: >60 mL/min (ref >60)
GFR calc non Af Amer: >60 mL/min (ref >60)
GLUCOSE: 69 mg/dL (ref 65–99)
POTASSIUM: 4.4 mmol/L (ref 3.5–5.1)
SODIUM: 136 mmol/L (ref 135–145)
Total Bilirubin: 0.8 mg/dL (ref 0.3–1.2)
Total Protein: 7.9 g/dL (ref 6.5–8.1)

## 2017-10-19 LAB — CBC
HCT: 49.3 % (ref 39.0–52.0)
Hemoglobin: 17.4 g/dL — ABNORMAL HIGH (ref 13.0–17.0)
MCH: 30.9 pg (ref 26.0–34.0)
MCHC: 35.3 g/dL (ref 30.0–36.0)
MCV: 87.6 fL (ref 78.0–100.0)
Platelets: 119 10*3/uL — ABNORMAL LOW (ref 150–400)
RBC: 5.63 MIL/uL (ref 4.22–5.81)
RDW: 14.5 % (ref 11.5–15.5)
WBC: 3.9 10*3/uL — ABNORMAL LOW (ref 4.0–10.5)

## 2017-10-19 LAB — ACETAMINOPHEN LEVEL: Acetaminophen (Tylenol), Serum: <10 ug/mL — ABNORMAL LOW (ref 10–30)

## 2017-10-19 LAB — MAGNESIUM: Magnesium: 1.9 mg/dL (ref 1.7–2.4)

## 2017-10-19 LAB — ETHANOL: Alcohol, Ethyl (B): 389 mg/dL (ref <10)

## 2017-10-19 LAB — SALICYLATE LEVEL: Salicylate Lvl: <7 mg/dL (ref 2.8–30.0)

## 2017-10-19 MED ORDER — THIAMINE HCL 100 MG/ML IJ SOLN
100.0000 mg | Freq: Every day | INTRAMUSCULAR | Status: DC
  Administered 2017-10-19 – 2017-10-20 (×2): 100 mg via INTRAVENOUS
  Filled 2017-10-19 (×2): qty 2

## 2017-10-19 MED ORDER — VITAMIN B-1 100 MG PO TABS: 100.0000 mg | ORAL_TABLET | Freq: Every day | ORAL | Status: DC

## 2017-10-19 MED ORDER — LORAZEPAM 2 MG/ML IJ SOLN
1.0000 mg | Freq: Once | INTRAMUSCULAR | Status: AC
  Administered 2017-10-19: 1 mg via INTRAVENOUS
  Filled 2017-10-19: qty 1

## 2017-10-19 MED ORDER — SODIUM CHLORIDE 0.9 % IV BOLUS
1000.0000 mL | Freq: Once | INTRAVENOUS | Status: AC
  Administered 2017-10-19: 1000 mL via INTRAVENOUS

## 2017-10-19 MED ORDER — ADULT MULTIVITAMIN W/MINERALS CH
1.0000 | ORAL_TABLET | Freq: Once | ORAL | Status: AC
  Administered 2017-10-19: 1 via ORAL
  Filled 2017-10-19: qty 1

## 2017-10-19 MED ORDER — FENTANYL CITRATE (PF) 100 MCG/2ML IJ SOLN
50.0000 ug | Freq: Once | INTRAMUSCULAR | Status: AC
  Administered 2017-10-19: 50 ug via INTRAVENOUS
  Filled 2017-10-19: qty 2

## 2017-10-19 MED ORDER — DIPHENHYDRAMINE HCL 25 MG PO CAPS
25.0000 mg | ORAL_CAPSULE | Freq: Once | ORAL | Status: AC
  Administered 2017-10-19: 25 mg via ORAL
  Filled 2017-10-19: qty 1

## 2017-10-19 NOTE — ED Provider Notes (Cosign Needed)
[facility] EMERGENCY DEPARTMENT Provider Note   CSN: [id number] Arrival date & time: 10/19/17  1958     History   Chief Complaint Chief Complaint  Patient presents with  . Ingestion  . Suicide Attempt    HPI [name] is a 37 y.o. male history of bipolar 1 disorder, anxiety, prior suicidal attempts, schizophrenia, alcohol withdrawal seizures, hypertension, hyperlipidemia who presents the emergency room today for suicidal ideation and overdose.  Patient notes over the last 1 month he has not been taking any of his medication as he has become shamed secondary to the cost that his mother has to pay in order for him to receive treatment.  He notes that since that time he has been hearing voices in his head that have been telling him to harm himself.  He has returned to drinking large amounts of alcohol that he reports is a half a gallon of liquor per day.  He reports over the last 2 days he has attempted to harm himself by drinking to Listerine bottles per day.  He notes that his most recent attempt today was at 4 PM.  Patient also reports that he had a fall today when he was in the bathroom where he hit his head.  He denies any loss of consciousness.  He is unsure at what time this happened.  He denies any headache, neurologic symptoms or neck pain.  He has had one episode of emesis in the department that was nonbilious nonbloody.  He denies any associated fever, pain or other physical complaints at this time.  Patient appears anxious and tearful.  Patient denies any homicidal ideation.  No visual hallucinations.  He does report that he smoked cocaine at 2 PM today.  No other illicit drug use.  He has not taken any of his daily medications.  HPI  Past Medical History:  Diagnosis Date  . Angina   . Anxiety    panic attack  . Bipolar 1 disorder ([place])   . Breast CA ([place]) dx'd 2009   bil w/ bil masectomy and oral meds  . Cancer [facility])    kidney cancer  . Coronary artery  disease   . Depression   . H/O suicide attempt 2015   overdose  . Headache(784.0)   . Hypercholesteremia   . Hypertension   . Liver cirrhosis ([place])   . Pancreatitis   . Peripheral vascular disease [facility]) April 2011   Left Pop  . Schizophrenia ([place])   . Seizures ([place])    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  . Shortness of breath     Patient Active Problem List   Diagnosis Date Noted  . Displaced fracture of lateral end of left clavicle, initial encounter for closed fracture 08/05/2017  . Coracoclavicular (ligament) sprain and strain, left, initial encounter 08/05/2017  . Closed dislocation of acromioclavicular joint, initial encounter 08/05/2017  . Insomnia   . HTN (hypertension), benign   . Severe episode of recurrent major depressive disorder, without psychotic features ([place])   . Depression, major, severe recurrence ([place]) 12/30/2015  . Substance induced mood disorder ([place]) 12/02/2015  . Mood disorder in conditions classified elsewhere   . MDD (major depressive disorder), recurrent severe, without psychosis ([place]) 10/08/2015  . Malnutrition of moderate degree 09/24/2015  . Alcohol withdrawal ([place]) 09/23/2015  . Tobacco use disorder 07/16/2015  . Elevated transaminase level   . Drug overdose, intentional ([place]) 07/12/2015  . Alcohol use disorder, severe, dependence ([place]) 04/15/2015  . Cocaine  abuse with cocaine-induced mood disorder ([place]) 04/11/2015  . Overdose 04/10/2015  . Severe recurrent major depressive disorder with psychotic features ([place])   . Severe major depression with psychotic features ([place]) 09/11/2014  . Alcohol-induced mood disorder ([place]) 09/10/2014  . Suicidal ideation   . Tylenol overdose   . Polysubstance abuse ([place])   . Overdose of acetaminophen 08/03/2014  . Overdose by acetaminophen 08/03/2014  . Cocaine abuse ([place]) 04/16/2014  . Thrombocytopenia ([place]) 04/15/2014  . Urinary tract infection, site not specified 04/15/2014  . Chest pain, unspecified  04/15/2014  . D-dimer, elevated 04/15/2014  . Transaminitis 09/24/2013  . Cocaine dependence ([place]) 09/20/2013  . S/p nephrectomy 04/28/2013  . Seizure ([place]) 03/15/2013  . Syncope 01/02/2013  . Leukocytopenia, unspecified 01/02/2013  . Left kidney mass 12/24/2012  . PTSD (post-traumatic stress disorder) 07/06/2012  . Peripheral vascular disease ([place]) 01/14/2012  . SEIZURE DISORDER 10/03/2008  . LIVER FUNCTION TESTS, ABNORMAL 12/29/2007  . HYPERCHOLESTEROLEMIA 03/21/2007  . Essential hypertension 03/21/2007    Past Surgical History:  Procedure Laterality Date  . BREAST SURGERY    . BREAST SURGERY     bilateral breast silocone  removal  . CHEST SURGERY    . left kidney removal    . left leg surgery     "popiteal artery clogged"  . MASTECTOMY Bilateral   . NEPHRECTOMY Left   . ORIF CLAVICULAR FRACTURE Left 08/10/2017   Procedure: OPEN REDUCTION INTERNAL FIXATION (ORIF) LEFT CLAVICLE FRACTURE WITH RECONSTRUCTION OF CORACOCLAVICULAR LIGAMENT;  Surgeon: Leandrew Koyanagi, MD;  Location: [facility];  Service: Orthopedics;  Laterality: Left;  . RECONSTRUCTION OF CORACOCLAVICULAR LIGAMENT Left 08/10/2017   Procedure: RECONSTRUCTION OF CORACOCLAVICULAR LIGAMENT;  Surgeon: Leandrew Koyanagi, MD;  Location: [facility];  Service: Orthopedics;  Laterality: Left;        Home Medications    Prior to Admission medications   Medication Sig Start Date End Date Taking? Authorizing Provider  amLODipine (NORVASC) 5 MG tablet Take 1 tablet (5 mg total) by mouth daily. For high blood pressure Patient taking differently: Take 5 mg by mouth 2 (two) times daily. For high blood pressure 08/04/17  Yes Money, Lowry Ram, FNP  atorvastatin (LIPITOR) 20 MG tablet Take 1 tablet (20 mg total) by mouth daily at 6 PM. For high cholesterol 08/03/17  Yes Money, Lowry Ram, FNP  divalproex (DEPAKOTE) 250 MG DR tablet Take 1 tablet (250 mg total) by mouth 2 (two) times daily. For mood control 08/03/17  Yes Money, Lowry Ram, FNP    hydrOXYzine (ATARAX/VISTARIL) 25 MG tablet Take 1 tablet (25 mg total) by mouth 3 (three) times daily as needed for anxiety. 08/03/17  Yes Money, Lowry Ram, FNP  ibuprofen (ADVIL,MOTRIN) 200 MG tablet Take 400 mg by mouth every 6 (six) hours as needed for headache or moderate pain.   Yes [provider]  Melatonin 5 MG TABS Take 5-10 mg by mouth at bedtime.   Yes [provider]  traZODone (DESYREL) 300 MG tablet Take 1 tablet (300 mg total) by mouth at bedtime. For sleep and mood 08/03/17  Yes Money, Lowry Ram, FNP  ziprasidone (GEODON) 20 MG capsule Take 1 capsule (20 mg total) by mouth 2 (two) times daily with a meal. For mood control 08/03/17  Yes Money, Lowry Ram, FNP  methocarbamol (ROBAXIN) 750 MG tablet Take 1 tablet (750 mg total) by mouth 2 (two) times daily as needed for muscle spasms. Patient not taking: Reported on 08/30/2017 08/10/17   Leandrew Koyanagi,  MD  ondansetron (ZOFRAN) 4 MG tablet Take 1-2 tablets (4-8 mg total) by mouth every 8 (eight) hours as needed for nausea or vomiting. Patient not taking: Reported on 10/19/2017 08/10/17   Leandrew Koyanagi, MD  oxyCODONE (OXY IR/ROXICODONE) 5 MG immediate release tablet Take 1-3 tablets (5-15 mg total) by mouth every 4 (four) hours as needed. Patient not taking: Reported on 08/30/2017 08/10/17   Leandrew Koyanagi, MD  promethazine (PHENERGAN) 25 MG tablet Take 1 tablet (25 mg total) by mouth every 6 (six) hours as needed for nausea. Patient not taking: Reported on 10/19/2017 08/10/17   Leandrew Koyanagi, MD  senna-docusate (SENOKOT S) 8.6-50 MG tablet Take 1 tablet by mouth at bedtime as needed. Patient not taking: Reported on 10/19/2017 08/10/17   Leandrew Koyanagi, MD  traMADol (ULTRAM) 50 MG tablet Take 1 tablet (50 mg total) by mouth every 6 (six) hours as needed for moderate pain. Patient not taking: Reported on 08/30/2017 08/03/17   Money, Lowry Ram, FNP    Family History Family History  Problem Relation Age of Onset  . Stroke Other   . Cancer  Other   . Hyperlipidemia Mother   . Hypertension Mother     Social History Social History   Tobacco Use  . Smoking status: Former Research scientist (life sciences)  . Smokeless tobacco: Never Used  . Tobacco comment: smoked on and off 16 years- quit 2016  Substance Use Topics  . Alcohol use: Yes    Comment: 08/08/17- No alcohol since 07/26/17  . Drug use: No    Frequency: 1.0 times per week    Types: "Crack" cocaine, Cocaine    Comment: 08/08/2017- no cocaine for 1 month     Allergies   Codeine; Penicillins; Morphine; Coconut flavor; Coconut oil; Grapefruit concentrate; Morphine and related; Oxycodone; and Norco [hydrocodone-acetaminophen]   Review of Systems Review of Systems  All other systems reviewed and are negative.    Physical Exam Updated Vital Signs BP (!) 132/94 (BP Location: Right Arm)   Pulse 99   Temp (!) 97.5 F (36.4 C) (Oral)   Resp 14   Wt 70.3 kg (155 lb)   SpO2 96%   BMI 25.02 kg/m   Physical Exam  Constitutional: He appears well-developed and well-nourished.  HENT:  Head: Normocephalic and atraumatic.  Right Ear: External ear normal.  Left Ear: External ear normal.  Nose: Nose normal.  Mouth/Throat: Uvula is midline, oropharynx is clear and moist and mucous membranes are normal. No tonsillar exudate.  Eyes: Pupils are equal, round, and reactive to light. Right eye exhibits no discharge. Left eye exhibits no discharge. No scleral icterus.  Pupils 4 mm bilaterally and reactive to light.  Neck: Trachea normal. Neck supple. No spinous process tenderness present. No neck rigidity. Normal range of motion present.  No cervical spine tenderness or step-offs.  Cardiovascular: Normal rate, regular rhythm and intact distal pulses.  No murmur heard. Pulses:      Radial pulses are 2+ on the right side, and 2+ on the left side.       Dorsalis pedis pulses are 2+ on the right side, and 2+ on the left side.       Posterior tibial pulses are 2+ on the right side, and 2+ on the left  side.  No lower extremity swelling or edema. Calves symmetric in size bilaterally.  Pulmonary/Chest: Effort normal and breath sounds normal. He exhibits no tenderness.  Abdominal: Soft. Bowel sounds are normal. There is no tenderness.  There is no rebound and no guarding.  Musculoskeletal: He exhibits no edema.  Left clavicle with scar from recent surgical repair.  No evidence of surrounding infection.  Lymphadenopathy:    He has no cervical adenopathy.  Neurological: He is alert.  Speech clear. Follows commands. No facial droop. PERRLA. EOM grossly intact. CN III-XII grossly intact. Grossly moves all extremities 4 without ataxia. Able and appropriate strength for age to upper and lower extremities bilaterally.   Skin: Skin is warm and dry. No rash noted. He is not diaphoretic.  Psychiatric: He has a normal mood and affect.  Nursing note and vitals reviewed.    ED Treatments / Results  Labs (all labs ordered are listed, but only abnormal results are displayed) Labs Reviewed  COMPREHENSIVE METABOLIC PANEL - Abnormal; Notable for the following components:      Result Value   Chloride 98 (*)    CO2 18 (*)    Calcium 8.4 (*)    AST 266 (*)    ALT 162 (*)    Anion gap 20 (*)    All other components within normal limits  ETHANOL - Abnormal; Notable for the following components:   Alcohol, Ethyl (B) 389 (*)    All other components within normal limits  ACETAMINOPHEN LEVEL - Abnormal; Notable for the following components:   Acetaminophen (Tylenol), Serum <10 (*)    All other components within normal limits  CBC - Abnormal; Notable for the following components:   WBC 3.9 (*)    Hemoglobin 17.4 (*)    Platelets 119 (*)    All other components within normal limits  SALICYLATE LEVEL  MAGNESIUM  RAPID URINE DRUG SCREEN, HOSP PERFORMED    EKG EKG Interpretation  Date/Time:  Wednesday October 19 2017 20:37:12 EDT Ventricular Rate:  88 PR Interval:  130 QRS Duration: 86 QT  Interval:  384 QTC Calculation: 464 R Axis:   73 Text Interpretation:  Normal sinus rhythm Normal ECG PVCs on previous tracing have resolved Confirmed by Theotis Burrow [phone] on 10/19/2017 9:31:51 PM   Radiology Ct Head Wo Contrast  Result Date: 10/19/2017 CLINICAL DATA:  Pt attempted suicide today by drinking two bottles of Listerine. Pt states he had seizure yesterday due to alcohol withdrawal and hit head. EXAM: CT HEAD WITHOUT CONTRAST TECHNIQUE: Contiguous axial images were obtained from the base of the skull through the vertex without intravenous contrast. COMPARISON:  07/25/2017 FINDINGS: Brain: Mild cerebral atrophy. No ventricular dilatation. No mass effect or midline shift. No abnormal extra-axial fluid collections. Gray-white matter junctions are distinct. Basal cisterns are not effaced. No acute intracranial hemorrhage. Vascular: No hyperdense vessel or unexpected calcification. Skull: Normal. Negative for fracture or focal lesion. Sinuses/Orbits: Mucosal thickening in the right maxillary antrum. No acute air-fluid levels. Mastoid air cells are clear. Other: None. IMPRESSION: No acute intracranial abnormalities.  Mild cerebral atrophy for age. Electronically Signed   By: Lucienne Capers M.D.   On: 10/19/2017 23:57    Procedures Procedures (including critical care time)  Medications Ordered in ED Medications  thiamine (VITAMIN B-1) tablet 100 mg ( Oral See Alternative 10/19/17 2227)    Or  thiamine (B-1) injection 100 mg (100 mg Intravenous Given 10/19/17 2227)  LORazepam (ATIVAN) injection 1 mg (1 mg Intravenous Given 10/19/17 2227)  sodium chloride 0.9 % bolus 1,000 mL (1,000 mLs Intravenous New Bag/Given 10/19/17 2310)  fentaNYL (SUBLIMAZE) injection 50 mcg (50 mcg Intravenous Given 10/19/17 2310)  multivitamin with minerals tablet 1 tablet (1 tablet  Oral Given 10/19/17 2313)  diphenhydrAMINE (BENADRYL) capsule 25 mg (25 mg Oral Given 10/19/17 2331)     Initial Impression / Assessment and  Plan / ED Course  I have reviewed the triage vital signs and the nursing notes.  Pertinent labs & imaging results that were available during my care of the patient were reviewed by me and considered in my medical decision making (see chart for details).      37 y.o. male history of bipolar 1 disorder, anxiety, prior suicidal attempts, schizophrenia, alcohol withdrawal seizures, hypertension, hyperlipidemia who presents the emergency room today for suicidal ideation and overdose.  Patient notes over the last 1 month he has not been taking any of his medication as he has become shamed secondary to the cost that his mother has to pay in order for him to receive treatment.  He notes that since that time he has been hearing voices in his head that have been telling him to harm himself.  He has returned to drinking large amounts of alcohol that he reports is a half a gallon of liquor per day.  He reports over the last 2 days he has attempted to harm himself by drinking to Listerine bottles per day.  He notes that his most recent attempt today was at 4 PM.  Patient also reports that he had a fall today when he was in the bathroom where he hit his head.  He denies any loss of consciousness.  He is unsure at what time this happened.  He denies any headache, neurologic symptoms or neck pain.  He has had one episode of emesis in the department that was nonbilious nonbloody.  He denies any associated fever, pain or other physical complaints at this time.  Patient appears anxious and tearful.  Patient denies any homicidal ideation.  No visual hallucinations.  He does report that he smoked cocaine at 2 PM today.  No other illicit drug use.  He has not taken any of his daily medications.  Took with Magda Paganini of poison control who recommended patient be observed for at least 6 hours.  She recommended EKG, acetaminophen level, alcohol level, salicylate level, LFT level.  She recommended BZD for seizure, withdrawals, and  anxiety. Labs were drawn prior to me seeing the patient.  I reviewed them with Magda Paganini who feels the patient is able to be medically cleared.  On my assessment the patient vital signs are reassuring.  He appears anxious. Ativan was administered. EKG NSR.  [name] labs are reassuring.  A elevated alcohol level of 389.  He does not appear to be clinically intoxicated. Does have elevation of AST and ALT. Anion Gap of 20 noted, likely for alcohol use. 2L IVF given along with Thiamine, Folate and Multivitamin. CIWA protocol initated.   CT head ordered as patient was intoxicated when he hit his head. This was negative.   Patient is medically cleared. Will re-ordered home meds. Patient signed out to provider default.   Patient case discussed with Dr.Little who is in agreement with plan.  Final Clinical Impressions(s) / ED Diagnoses   Final diagnoses:  Intentional drug overdose, initial encounter [facility])  Encounter for medical clearance for patient hold    ED Discharge Orders    None       Jillyn Ledger, [place] 10/20/17 0131

## 2017-10-19 NOTE — ED Notes (Signed)
Patient transported to CT 

## 2017-10-19 NOTE — ED Notes (Signed)
Pt's belongings given to mother to take home.

## 2017-10-19 NOTE — ED Triage Notes (Signed)
Pt reports that he drank two bottles of Listerine today in attempt to kill himself, pt also reports drinking a half a gallon of liquor which he drinks daily, last drink 30 minutes ago, had been off psych medications for the past week, reports visual hallucinations of people chasing him and states he had a seizure yesterday from alcohol withdraw

## 2017-10-19 NOTE — ED Notes (Signed)
Vomiting

## 2017-10-19 NOTE — ED Notes (Addendum)
Spoke with poison control, watch for intoxication, CNS depression, and seizures EKG and 4 hour tylenol level, ethanol level, magnesium level, needs thiamine and folate

## 2017-10-19 NOTE — ED Notes (Signed)
ED Provider at bedside. 

## 2017-10-20 ENCOUNTER — Encounter (HOSPITAL_COMMUNITY): Payer: Self-pay | Admitting: Registered Nurse

## 2017-10-20 ENCOUNTER — Emergency Department (HOSPITAL_COMMUNITY): Payer: Self-pay

## 2017-10-20 DIAGNOSIS — R1110 Vomiting, unspecified: Secondary | ICD-10-CM | POA: Diagnosis present

## 2017-10-20 LAB — I-STAT CHEM 8, ED
BUN: 16 mg/dL (ref 6–20)
CHLORIDE: 102 mmol/L (ref 101–111)
Calcium, Ion: 0.88 mmol/L — CL (ref 1.15–1.40)
Creatinine, Ser: 1.2 mg/dL (ref 0.61–1.24)
Glucose, Bld: 86 mg/dL (ref 65–99)
HEMATOCRIT: 40 % (ref 39.0–52.0)
Hemoglobin: 13.6 g/dL (ref 13.0–17.0)
POTASSIUM: 4.1 mmol/L (ref 3.5–5.1)
SODIUM: 137 mmol/L (ref 135–145)
TCO2: 22 mmol/L (ref 22–32)

## 2017-10-20 LAB — I-STAT VENOUS BLOOD GAS, ED
Acid-base deficit: 4 mmol/L — ABNORMAL HIGH (ref 0.0–2.0)
BICARBONATE: 23 mmol/L (ref 20.0–28.0)
O2 Saturation: 74 %
PCO2 VEN: 46.1 mmHg (ref 44.0–60.0)
TCO2: 24 mmol/L (ref 22–32)
pH, Ven: 7.305 (ref 7.250–7.430)
pO2, Ven: 43 mmHg (ref 32.0–45.0)

## 2017-10-20 LAB — CBC
HCT: 38 % — ABNORMAL LOW (ref 39.0–52.0)
HEMOGLOBIN: 13.3 g/dL (ref 13.0–17.0)
MCH: 30.5 pg (ref 26.0–34.0)
MCHC: 35 g/dL (ref 30.0–36.0)
MCV: 87.2 fL (ref 78.0–100.0)
PLATELETS: 86 10*3/uL — AB (ref 150–400)
RBC: 4.36 MIL/uL (ref 4.22–5.81)
RDW: 14.4 % (ref 11.5–15.5)
WBC: 3.7 10*3/uL — AB (ref 4.0–10.5)

## 2017-10-20 LAB — RAPID URINE DRUG SCREEN, HOSP PERFORMED
Amphetamines: NOT DETECTED
BARBITURATES: NOT DETECTED
Benzodiazepines: NOT DETECTED
COCAINE: POSITIVE — AB
Opiates: NOT DETECTED
Tetrahydrocannabinol: NOT DETECTED

## 2017-10-20 LAB — I-STAT CG4 LACTIC ACID, ED
Lactic Acid, Venous: 2.45 mmol/L (ref 0.5–1.9)
Lactic Acid, Venous: 2.05 mmol/L (ref 0.5–1.9)

## 2017-10-20 LAB — AMMONIA: AMMONIA: 36 umol/L — AB (ref 9–35)

## 2017-10-20 LAB — COMPREHENSIVE METABOLIC PANEL
ALK PHOS: 64 U/L (ref 38–126)
ALT: 128 U/L — AB (ref 17–63)
AST: 233 U/L — AB (ref 15–41)
Albumin: 2.9 g/dL — ABNORMAL LOW (ref 3.5–5.0)
Anion gap: 13 (ref 5–15)
BUN: 12 mg/dL (ref 6–20)
CALCIUM: 7.3 mg/dL — AB (ref 8.9–10.3)
CHLORIDE: 103 mmol/L (ref 101–111)
CO2: 20 mmol/L — AB (ref 22–32)
CREATININE: 0.92 mg/dL (ref 0.61–1.24)
GFR calc Af Amer: >60 mL/min (ref >60)
GFR calc non Af Amer: >60 mL/min (ref >60)
Glucose, Bld: 86 mg/dL (ref 65–99)
Potassium: 3.7 mmol/L (ref 3.5–5.1)
Sodium: 136 mmol/L (ref 135–145)
Total Bilirubin: 0.6 mg/dL (ref 0.3–1.2)
Total Protein: 5.6 g/dL — ABNORMAL LOW (ref 6.5–8.1)

## 2017-10-20 LAB — VALPROIC ACID LEVEL: Valproic Acid Lvl: <10 ug/mL — ABNORMAL LOW (ref 50.0–100.0)

## 2017-10-20 LAB — ACETAMINOPHEN LEVEL

## 2017-10-20 LAB — OSMOLALITY: OSMOLALITY: 370 mosm/kg — AB (ref 275–295)

## 2017-10-20 LAB — LACTIC ACID, PLASMA
Lactic Acid, Venous: 1.9 mmol/L (ref 0.5–1.9)
Lactic Acid, Venous: 1 mmol/L (ref 0.5–1.9)

## 2017-10-20 LAB — SALICYLATE LEVEL: Salicylate Lvl: <7 mg/dL (ref 2.8–30.0)

## 2017-10-20 LAB — ETHANOL: Alcohol, Ethyl (B): 180 mg/dL — ABNORMAL HIGH (ref <10)

## 2017-10-20 MED ORDER — VITAMIN B-1 100 MG PO TABS
100.0000 mg | ORAL_TABLET | Freq: Every day | ORAL | Status: DC
  Administered 2017-10-21 – 2017-10-26 (×6): 100 mg via ORAL
  Filled 2017-10-20 (×6): qty 1

## 2017-10-20 MED ORDER — THIAMINE HCL 100 MG/ML IJ SOLN: 100.0000 mg | Freq: Every day | INTRAMUSCULAR | Status: DC

## 2017-10-20 MED ORDER — ADULT MULTIVITAMIN W/MINERALS CH
1.0000 | ORAL_TABLET | Freq: Every day | ORAL | Status: DC
  Administered 2017-10-20 – 2017-10-26 (×7): 1 via ORAL
  Filled 2017-10-20 (×7): qty 1

## 2017-10-20 MED ORDER — IOPAMIDOL (ISOVUE-300) INJECTION 61%
INTRAVENOUS | Status: AC
  Administered 2017-10-20: 80 mL
  Filled 2017-10-20: qty 100

## 2017-10-20 MED ORDER — FOLIC ACID 1 MG PO TABS
1.0000 mg | ORAL_TABLET | Freq: Every day | ORAL | Status: DC
  Administered 2017-10-20 – 2017-10-26 (×7): 1 mg via ORAL
  Filled 2017-10-20 (×7): qty 1

## 2017-10-20 MED ORDER — FOLIC ACID 1 MG PO TABS: 1.0000 mg | ORAL_TABLET | Freq: Every day | ORAL | Status: DC

## 2017-10-20 MED ORDER — DIPHENHYDRAMINE-ZINC ACETATE 2-0.1 % EX CREA
TOPICAL_CREAM | Freq: Two times a day (BID) | CUTANEOUS | Status: DC | PRN
  Administered 2017-10-20 – 2017-10-21 (×2): via TOPICAL
  Filled 2017-10-20 (×2): qty 28

## 2017-10-20 MED ORDER — AMLODIPINE BESYLATE 5 MG PO TABS: 5.0000 mg | ORAL_TABLET | Freq: Every day | ORAL | Status: DC

## 2017-10-20 MED ORDER — TRAZODONE HCL 50 MG PO TABS
300.0000 mg | ORAL_TABLET | Freq: Every day | ORAL | Status: DC
  Administered 2017-10-20: 300 mg via ORAL
  Filled 2017-10-20: qty 6

## 2017-10-20 MED ORDER — ONDANSETRON HCL 4 MG PO TABS: 4.0000 mg | ORAL_TABLET | Freq: Four times a day (QID) | ORAL | Status: DC | PRN

## 2017-10-20 MED ORDER — ONDANSETRON HCL 4 MG/2ML IJ SOLN: 4.0000 mg | Freq: Four times a day (QID) | INTRAMUSCULAR | Status: DC | PRN

## 2017-10-20 MED ORDER — SODIUM CHLORIDE 0.9 % IV SOLN
INTRAVENOUS | Status: DC
  Administered 2017-10-20 – 2017-10-21 (×3): via INTRAVENOUS

## 2017-10-20 MED ORDER — SODIUM CHLORIDE 0.9 % IV BOLUS
1000.0000 mL | Freq: Once | INTRAVENOUS | Status: AC
  Administered 2017-10-20: 1000 mL via INTRAVENOUS

## 2017-10-20 MED ORDER — MELATONIN 3 MG PO TABS
3.0000 mg | ORAL_TABLET | Freq: Every day | ORAL | Status: DC
  Administered 2017-10-20: 3 mg via ORAL
  Filled 2017-10-20: qty 1

## 2017-10-20 MED ORDER — DIVALPROEX SODIUM 250 MG PO DR TAB
250.0000 mg | DELAYED_RELEASE_TABLET | Freq: Two times a day (BID) | ORAL | Status: DC
  Administered 2017-10-20 (×2): 250 mg via ORAL
  Filled 2017-10-20 (×2): qty 1

## 2017-10-20 MED ORDER — POLYETHYLENE GLYCOL 3350 17 G PO PACK: 17.0000 g | PACK | Freq: Every day | ORAL | Status: DC | PRN

## 2017-10-20 MED ORDER — ACETAMINOPHEN 325 MG PO TABS: 650.0000 mg | ORAL_TABLET | Freq: Four times a day (QID) | ORAL | Status: DC | PRN

## 2017-10-20 MED ORDER — MAGNESIUM SULFATE 2 GM/50ML IV SOLN
2.0000 g | Freq: Once | INTRAVENOUS | Status: AC
  Administered 2017-10-20: 2 g via INTRAVENOUS
  Filled 2017-10-20: qty 50

## 2017-10-20 MED ORDER — THIAMINE HCL 100 MG/ML IJ SOLN
100.0000 mg | Freq: Every day | INTRAMUSCULAR | Status: DC
  Filled 2017-10-20 (×2): qty 2

## 2017-10-20 MED ORDER — HYDROXYZINE HCL 25 MG PO TABS: 25.0000 mg | ORAL_TABLET | Freq: Three times a day (TID) | ORAL | Status: DC | PRN

## 2017-10-20 MED ORDER — ATORVASTATIN CALCIUM 20 MG PO TABS
20.0000 mg | ORAL_TABLET | Freq: Every day | ORAL | Status: DC
  Filled 2017-10-20: qty 1

## 2017-10-20 MED ORDER — PANTOPRAZOLE SODIUM 40 MG IV SOLR
40.0000 mg | Freq: Once | INTRAVENOUS | Status: AC
  Administered 2017-10-20: 40 mg via INTRAVENOUS
  Filled 2017-10-20: qty 40

## 2017-10-20 MED ORDER — LORAZEPAM 1 MG PO TABS
0.0000 mg | ORAL_TABLET | Freq: Two times a day (BID) | ORAL | Status: AC
  Administered 2017-10-23: 1 mg via ORAL
  Filled 2017-10-20: qty 1

## 2017-10-20 MED ORDER — TRAZODONE HCL 50 MG PO TABS: 100.0000 mg | ORAL_TABLET | Freq: Every day | ORAL | Status: DC

## 2017-10-20 MED ORDER — ZIPRASIDONE HCL 20 MG PO CAPS
20.0000 mg | ORAL_CAPSULE | Freq: Two times a day (BID) | ORAL | Status: DC
  Filled 2017-10-20 (×2): qty 1

## 2017-10-20 MED ORDER — LORAZEPAM 2 MG/ML IJ SOLN
1.0000 mg | Freq: Once | INTRAMUSCULAR | Status: AC
  Administered 2017-10-20: 1 mg via INTRAVENOUS
  Filled 2017-10-20: qty 1

## 2017-10-20 MED ORDER — SODIUM CHLORIDE 0.9 % IV BOLUS
500.0000 mL | Freq: Once | INTRAVENOUS | Status: AC
  Administered 2017-10-20: 500 mL via INTRAVENOUS

## 2017-10-20 MED ORDER — ENOXAPARIN SODIUM 40 MG/0.4ML ~~LOC~~ SOLN
40.0000 mg | SUBCUTANEOUS | Status: DC
  Administered 2017-10-20 – 2017-10-22 (×3): 40 mg via SUBCUTANEOUS
  Filled 2017-10-20 (×3): qty 0.4

## 2017-10-20 MED ORDER — VITAMIN B-1 100 MG PO TABS: 100.0000 mg | ORAL_TABLET | Freq: Every day | ORAL | Status: DC

## 2017-10-20 MED ORDER — ADULT MULTIVITAMIN W/MINERALS CH: 1.0000 | ORAL_TABLET | Freq: Every day | ORAL | Status: DC

## 2017-10-20 MED ORDER — TRAZODONE HCL 100 MG PO TABS
300.0000 mg | ORAL_TABLET | Freq: Every day | ORAL | Status: DC
  Administered 2017-10-20 – 2017-10-25 (×6): 300 mg via ORAL
  Filled 2017-10-20 (×4): qty 3
  Filled 2017-10-20: qty 2
  Filled 2017-10-20 (×2): qty 3

## 2017-10-20 MED ORDER — ACETAMINOPHEN 650 MG RE SUPP: 650.0000 mg | Freq: Four times a day (QID) | RECTAL | Status: DC | PRN

## 2017-10-20 MED ORDER — LORAZEPAM 1 MG PO TABS
0.0000 mg | ORAL_TABLET | Freq: Four times a day (QID) | ORAL | Status: AC
  Administered 2017-10-20: 1 mg via ORAL
  Administered 2017-10-21: 2 mg via ORAL
  Filled 2017-10-20: qty 2
  Filled 2017-10-20: qty 1

## 2017-10-20 MED ORDER — SODIUM CHLORIDE 0.9 % IV SOLN
Freq: Once | INTRAVENOUS | Status: AC
Start: 2017-10-20 — End: 2017-10-20
  Administered 2017-10-20: 09:00:00 via INTRAVENOUS

## 2017-10-20 MED ORDER — ONDANSETRON HCL 4 MG/2ML IJ SOLN
4.0000 mg | Freq: Once | INTRAMUSCULAR | Status: AC
  Administered 2017-10-20: 4 mg via INTRAVENOUS
  Filled 2017-10-20: qty 2

## 2017-10-20 MED ORDER — DICYCLOMINE HCL 10 MG/ML IM SOLN
20.0000 mg | Freq: Once | INTRAMUSCULAR | Status: AC
  Administered 2017-10-20: 20 mg via INTRAMUSCULAR
  Filled 2017-10-20: qty 2

## 2017-10-20 MED ORDER — LORAZEPAM 1 MG PO TABS
1.0000 mg | ORAL_TABLET | Freq: Three times a day (TID) | ORAL | Status: DC
  Administered 2017-10-20 (×2): 1 mg via ORAL
  Filled 2017-10-20 (×2): qty 1

## 2017-10-20 MED ORDER — ONDANSETRON HCL 4 MG/2ML IJ SOLN
4.0000 mg | Freq: Once | INTRAMUSCULAR | Status: DC
Start: 2017-10-20 — End: 2017-10-20

## 2017-10-20 MED ORDER — GI COCKTAIL ~~LOC~~
30.0000 mL | Freq: Once | ORAL | Status: AC
  Administered 2017-10-20: 30 mL via ORAL
  Filled 2017-10-20: qty 30

## 2017-10-20 MED ORDER — DIPHENHYDRAMINE HCL 25 MG PO CAPS
25.0000 mg | ORAL_CAPSULE | Freq: Once | ORAL | Status: AC
  Administered 2017-10-20: 25 mg via ORAL
  Filled 2017-10-20: qty 1

## 2017-10-20 NOTE — ED Notes (Signed)
Attempted to call report

## 2017-10-20 NOTE — BH Assessment (Signed)
Called pt's nurse, [place] about [phone], to initiate and set-up TTS assessment. Had difficulty reaching nurse. Once reached was told would set up TA equipment for assessment. Called the TA equipment as agreed and sitter answered stating that pt was agitated and assessment could not be completed now. We will attempt at a later time once pt has calmed down and can be assessed.

## 2017-10-20 NOTE — ED Notes (Signed)
ED Provider at bedside. 

## 2017-10-20 NOTE — ED Notes (Signed)
Patient transported to CT 

## 2017-10-20 NOTE — ED Notes (Signed)
TTS in progress at bedside.   

## 2017-10-20 NOTE — ED Notes (Signed)
MD Schlossman notified

## 2017-10-20 NOTE — ED Notes (Signed)
Dr. Schlossman at bedside. 
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
## 2017-10-20 NOTE — ED Notes (Signed)
Pt currently endorses SI with plan "to take medication". Denies HI, denies hallucinations at this time.

## 2017-10-20 NOTE — ED Notes (Signed)
Pt given crackers and bag lunch

## 2017-10-20 NOTE — ED Provider Notes (Signed)
Assumed care from Dr. Billy Fischer at 7:47 AM. Briefly, the patient is a 37 y.o. male with PMHx of  has a past medical history of Angina, Anxiety, Bipolar 1 disorder ([place]), Breast CA ([place]) (dx'd 2009), Cancer ([place]), Coronary artery disease, Depression, H/O suicide attempt (2015), Headache(784.0), Hypercholesteremia, Hypertension, Liver cirrhosis ([place]), Pancreatitis, Peripheral vascular disease [facility]) (April 2011), Schizophrenia [facility]), Seizures ([place]), and Shortness of breath. 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 Seen initially - AG 20, LFTs up thought 2/2 EtOH use. Home meds ordered and given trazodone, which made him confused/altered and with Depakote.   Repeat labs showed that AG closed, bicarb improved, LFTs downtrending. APAP negative. Mild lactic acidosis likely 2/2 dehydration, alcoholic hepatitis. No osmolar or anion gap. Now c/o abd pain so CT scan ordered.  Labs Reviewed  COMPREHENSIVE METABOLIC PANEL - Abnormal; Notable for the following components:      Result Value   Chloride 98 (*)    CO2 18 (*)    Calcium 8.4 (*)    AST 266 (*)    ALT 162 (*)    Anion gap 20 (*)    All other components within normal limits  ETHANOL - Abnormal; Notable for the following components:   Alcohol, Ethyl (B) 389 (*)    All other components within normal limits  ACETAMINOPHEN LEVEL - Abnormal; Notable for the following components:   Acetaminophen (Tylenol), Serum <10 (*)    All other components within normal limits  CBC - Abnormal; Notable for the following components:   WBC 3.9 (*)    Hemoglobin 17.4 (*)    Platelets 119 (*)    All other components within normal limits  RAPID URINE DRUG SCREEN, HOSP PERFORMED - Abnormal; Notable for the following components:   Cocaine POSITIVE (*)    All other components within normal limits  OSMOLALITY - Abnormal; Notable for the following components:   Osmolality 370 (*)    All other components within normal limits  CBC - Abnormal;  Notable for the following components:   WBC 3.7 (*)    HCT 38.0 (*)    Platelets 86 (*)    All other components within normal limits  COMPREHENSIVE METABOLIC PANEL - Abnormal; Notable for the following components:   CO2 20 (*)    Calcium 7.3 (*)    Total Protein 5.6 (*)    Albumin 2.9 (*)    AST 233 (*)    ALT 128 (*)    All other components within normal limits  VALPROIC ACID LEVEL - Abnormal; Notable for the following components:   Valproic Acid Lvl <10 (*)    All other components within normal limits  I-STAT CG4 LACTIC ACID, ED - Abnormal; Notable for the following components:   Lactic Acid, Venous 2.45 (*)    All other components within normal limits  I-STAT CHEM 8, ED - Abnormal; Notable for the following components:   Calcium, Ion 0.88 (*)    All other components within normal limits  I-STAT VENOUS BLOOD GAS, ED - Abnormal; Notable for the following components:   Acid-base deficit 4.0 (*)    All other components within normal limits  I-STAT CG4 LACTIC ACID, ED - Abnormal; Notable for the following components:   Lactic Acid, Venous 2.05 (*)    All other components within normal limits  SALICYLATE LEVEL  MAGNESIUM  ACETAMINOPHEN LEVEL  SALICYLATE LEVEL  ETHANOL    Clinical Course as of Oct 21 1142  Thu Oct 20, 2017  3235 Assumed care. Pt with likely alcohol hepatitis, alcoholic ketoacidosis and dehydration but now with abd pain. Bentyl given, will plan to obtain CT. Likely medically clear pending repeat.    [CI]  1144 Patient with persistent tachycardia and vomiting despite antiemetics and treatment.  I suspect this is alcoholic gastritis versus hepatitis.  He has been cleared from a psychiatric perspective but given his ongoing vomiting and dehydration, will admit for fluids and further treatment.   [CI]    Clinical Course User Index [CI] Duffy Bruce, MD        Duffy Bruce, MD 10/20/17 1145

## 2017-10-20 NOTE — ED Notes (Signed)
TTS set up at bedside. 

## 2017-10-20 NOTE — ED Notes (Signed)
Original IVC paperwork placed in red folder

## 2017-10-20 NOTE — ED Notes (Signed)
Copy of IVC paperwork placed in medical records

## 2017-10-20 NOTE — ED Notes (Signed)
Admitting team at bedside.

## 2017-10-20 NOTE — ED Notes (Signed)
IVC paperwork faxed to [facility] 
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
## 2017-10-20 NOTE — ED Notes (Signed)
Pt in CT, CT called to notify this RN that pt is vomiting in CT.

## 2017-10-20 NOTE — Progress Notes (Signed)
Pt transferring to 3w16. Gave report to receiving RN Arsenio Loader. Called pt mom and made aware of pt transfer.

## 2017-10-20 NOTE — ED Notes (Signed)
Pt given food and drink.

## 2017-10-20 NOTE — ED Provider Notes (Addendum)
Assumed care from Dr. Rex Kras and Maczis at midnight. Briefly, the patient is a 37 y.o. male with PMHx of  has a past medical history of Angina, Anxiety, Bipolar 1 disorder ([place]), Breast CA ([place]) (dx'd 2009), Cancer ([place]), Coronary artery disease, Depression, H/O suicide attempt (2015), Headache(784.0), Hypercholesteremia, Hypertension, Liver cirrhosis ([place]), Pancreatitis, Peripheral vascular disease [facility]) (April 2011), Schizophrenia [facility]), Seizures ([address]), and Shortness of breath. 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 Seen initially - AG 20, LFTs up thought 2/2 EtOH use, was medically cleared.  I was called into room given patient with acute change in mental status, more sleepy, blood pressures down to 16X and 09U systolic.  This occurred after he was given trazodone 300mg  and depakote. Pt reports he had been compliant with these meds in past.  Gave IV fluids 2L and repeated labs.  Repeat labs showed that AG closed, bicarb improved, LFTs downtrending. APAP negative.No osmolar gap. Mild lactic acidosis likely 2/2 dehydration, alcoholic hepatitis. Suspect changes secondary to medication effect in setting of etoh, dehydration.   PT with improvement overnight, now more mentally clear but reporting more focal abdominal pain in RLQ and continuing nausea. LFTs elevated however no RUQ pain to suggest cholecystitis.  Ordered CT abdomen pelvis, zofran, bentyl and signed out to Dr. Ellender Hose. If scan without acute pathology and pt tolerating po may be medically cleared once again for psychiatric eval.     Gareth Morgan, MD 10/20/17 1919   CRITICAL CARE: hypotension, given IV fluids, dehydration and medication related Performed by: Tennis Must   Total critical care time: 30 minutes  Critical care time was exclusive of separately billable procedures and treating other patients.  Critical care was necessary to treat or prevent imminent or life-threatening  deterioration.  Critical care was time spent personally by me on the following activities: development of treatment plan with patient and/or surrogate as well as nursing, discussions with consultants, evaluation of patient's response to treatment, examination of patient, obtaining history from patient or surrogate, ordering and performing treatments and interventions, ordering and review of laboratory studies, ordering and review of radiographic studies, pulse oximetry and re-evaluation of patient's condition.    Gareth Morgan, MD 10/20/17 Lurena Nida

## 2017-10-20 NOTE — ED Notes (Signed)
Pt started complaining on sudden abdominal pain. Pt is suddenly disoriented and not as responsive. Pt BP suddenly dropped. MD at bedside. 2L bolus started.

## 2017-10-20 NOTE — ED Notes (Signed)
Pt is now oriented x4. Pt states that he does not have any memory of just being disoriented and lethargic. Pt said abdominal pain is improving now.

## 2017-10-20 NOTE — ED Notes (Signed)
MD Schlossman notified of abnormal Lactic

## 2017-10-20 NOTE — ED Notes (Signed)
Pt given ginger ale, encouraged to take small slow sips. If he is able to tolerate this, will give PO medications and food.

## 2017-10-20 NOTE — ED Notes (Signed)
MD at bedside. 
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
## 2017-10-21 ENCOUNTER — Other Ambulatory Visit: Payer: Self-pay

## 2017-10-21 ENCOUNTER — Inpatient Hospital Stay (HOSPITAL_COMMUNITY): Payer: Self-pay

## 2017-10-21 ENCOUNTER — Encounter (HOSPITAL_COMMUNITY): Payer: Self-pay | Admitting: *Deleted

## 2017-10-21 DIAGNOSIS — Z811 Family history of alcohol abuse and dependence: Secondary | ICD-10-CM

## 2017-10-21 DIAGNOSIS — R112 Nausea with vomiting, unspecified: Secondary | ICD-10-CM

## 2017-10-21 DIAGNOSIS — I4891 Unspecified atrial fibrillation: Secondary | ICD-10-CM

## 2017-10-21 DIAGNOSIS — R4587 Impulsiveness: Secondary | ICD-10-CM

## 2017-10-21 DIAGNOSIS — F333 Major depressive disorder, recurrent, severe with psychotic symptoms: Secondary | ICD-10-CM

## 2017-10-21 DIAGNOSIS — F1920 Other psychoactive substance dependence, uncomplicated: Secondary | ICD-10-CM

## 2017-10-21 DIAGNOSIS — Y908 Blood alcohol level of 240 mg/100 ml or more: Secondary | ICD-10-CM

## 2017-10-21 DIAGNOSIS — F1490 Cocaine use, unspecified, uncomplicated: Secondary | ICD-10-CM

## 2017-10-21 DIAGNOSIS — Z87891 Personal history of nicotine dependence: Secondary | ICD-10-CM

## 2017-10-21 DIAGNOSIS — R109 Unspecified abdominal pain: Secondary | ICD-10-CM

## 2017-10-21 DIAGNOSIS — T50902D Poisoning by unspecified drugs, medicaments and biological substances, intentional self-harm, subsequent encounter: Secondary | ICD-10-CM

## 2017-10-21 DIAGNOSIS — Z813 Family history of other psychoactive substance abuse and dependence: Secondary | ICD-10-CM

## 2017-10-21 LAB — CBC
HCT: 34.9 % — ABNORMAL LOW (ref 39.0–52.0)
Hemoglobin: 12 g/dL — ABNORMAL LOW (ref 13.0–17.0)
MCH: 29.9 pg (ref 26.0–34.0)
MCHC: 34.4 g/dL (ref 30.0–36.0)
MCV: 87 fL (ref 78.0–100.0)
PLATELETS: 59 10*3/uL — AB (ref 150–400)
RBC: 4.01 MIL/uL — ABNORMAL LOW (ref 4.22–5.81)
RDW: 13.7 % (ref 11.5–15.5)
WBC: 2.1 10*3/uL — ABNORMAL LOW (ref 4.0–10.5)

## 2017-10-21 LAB — CBC WITH DIFFERENTIAL/PLATELET
BASOS ABS: 0 10*3/uL (ref 0.0–0.1)
Basophils Relative: 0 %
EOS PCT: 3 %
Eosinophils Absolute: 0.1 10*3/uL (ref 0.0–0.7)
LYMPHS ABS: 1.1 10*3/uL (ref 0.7–4.0)
LYMPHS PCT: 48 %
MONO ABS: 0.1 10*3/uL (ref 0.1–1.0)
Monocytes Relative: 6 %
Neutro Abs: 1 10*3/uL — ABNORMAL LOW (ref 1.7–7.7)
Neutrophils Relative %: 43 %

## 2017-10-21 LAB — COMPREHENSIVE METABOLIC PANEL
ALBUMIN: 2.7 g/dL — AB (ref 3.5–5.0)
ALK PHOS: 58 U/L (ref 38–126)
ALT: 113 U/L — AB (ref 17–63)
AST: 178 U/L — ABNORMAL HIGH (ref 15–41)
Anion gap: 11 (ref 5–15)
CALCIUM: 8 mg/dL — AB (ref 8.9–10.3)
CO2: 23 mmol/L (ref 22–32)
CREATININE: 0.82 mg/dL (ref 0.61–1.24)
Chloride: 98 mmol/L — ABNORMAL LOW (ref 101–111)
GFR calc non Af Amer: >60 mL/min (ref >60)
GLUCOSE: 65 mg/dL (ref 65–99)
Potassium: 3.4 mmol/L — ABNORMAL LOW (ref 3.5–5.1)
SODIUM: 132 mmol/L — AB (ref 135–145)
Total Bilirubin: 1.4 mg/dL — ABNORMAL HIGH (ref 0.3–1.2)
Total Protein: 5.3 g/dL — ABNORMAL LOW (ref 6.5–8.1)

## 2017-10-21 LAB — ECHOCARDIOGRAM COMPLETE
Height: 66 in
Weight: 2553.81 oz

## 2017-10-21 LAB — HIV ANTIBODY (ROUTINE TESTING W REFLEX): HIV Screen 4th Generation wRfx: NONREACTIVE

## 2017-10-21 LAB — MAGNESIUM: MAGNESIUM: 1.8 mg/dL (ref 1.7–2.4)

## 2017-10-21 MED ORDER — DIPHENHYDRAMINE HCL 50 MG/ML IJ SOLN
25.0000 mg | Freq: Once | INTRAMUSCULAR | Status: AC
  Administered 2017-10-21: 25 mg via INTRAVENOUS
  Filled 2017-10-21: qty 1

## 2017-10-21 MED ORDER — MAGNESIUM SULFATE 2 GM/50ML IV SOLN
2.0000 g | Freq: Once | INTRAVENOUS | Status: AC
  Administered 2017-10-21: 2 g via INTRAVENOUS
  Filled 2017-10-21: qty 50

## 2017-10-21 MED ORDER — POTASSIUM CHLORIDE 10 MEQ/100ML IV SOLN
10.0000 meq | INTRAVENOUS | Status: DC
  Filled 2017-10-21 (×6): qty 100

## 2017-10-21 MED ORDER — SODIUM CHLORIDE 0.9 % IV BOLUS: 500.0000 mL | INTRAVENOUS | Status: DC

## 2017-10-21 MED ORDER — LORATADINE 10 MG PO TABS
10.0000 mg | ORAL_TABLET | Freq: Every day | ORAL | Status: DC
  Administered 2017-10-21 – 2017-10-26 (×6): 10 mg via ORAL
  Filled 2017-10-21 (×7): qty 1

## 2017-10-21 MED ORDER — GABAPENTIN 300 MG PO CAPS
300.0000 mg | ORAL_CAPSULE | Freq: Every day | ORAL | Status: DC
  Administered 2017-10-21 – 2017-10-25 (×6): 300 mg via ORAL
  Filled 2017-10-21 (×6): qty 1

## 2017-10-21 MED ORDER — LORAZEPAM 1 MG PO TABS
1.0000 mg | ORAL_TABLET | Freq: Two times a day (BID) | ORAL | Status: DC
  Administered 2017-10-21 – 2017-10-22 (×3): 1 mg via ORAL
  Filled 2017-10-21 (×3): qty 1

## 2017-10-21 MED ORDER — POTASSIUM CHLORIDE 10 MEQ/100ML IV SOLN
10.0000 meq | INTRAVENOUS | Status: AC
  Administered 2017-10-21 (×3): 10 meq via INTRAVENOUS
  Filled 2017-10-21 (×3): qty 100

## 2017-10-21 MED ORDER — HYDROXYZINE HCL 25 MG PO TABS
25.0000 mg | ORAL_TABLET | Freq: Three times a day (TID) | ORAL | Status: DC | PRN
  Administered 2017-10-21 – 2017-10-24 (×3): 25 mg via ORAL
  Filled 2017-10-21 (×3): qty 1

## 2017-10-21 NOTE — Progress Notes (Signed)
Family Medicine Teaching Service Daily Progress Note Intern Pager: [phone]  Patient name: [name] Medical record number: [id number] Date of birth: 22-Jan-1981 Age: 37 y.o. Gender: male  Primary Care Provider: Patient, No Pcp Per Consultants: Psychiatry Code Status: Full  Pt Overview and Major Events to Date:  4/4 - admitted for N/V, alcohol withdrawal  Assessment and Plan: [name] is a 37 y.o. male presenting with nausea and vomiting after consuming large amounts of listerine. PMH is significant for polysubstance use disorder (alcohol, cocaine, tobacco), PVD, HLD, PTSD, MDD w/ h/o suicide attempts, h/o breast CA and kidney CA s/p treatment.  Nausea and vomiting 2/2 acute alcohol intoxication, improved- Afebrile and stable vitals, ate dinner last night. No nausea this morning. WBC 3.9>3.7>2.1>2.2, could be due to Ativan unlikely infectious given no fevers or diarrhea.  - monitor on CIWA protocol - I&Os - zofran PRN - monitor BMP  - advance diet   Alcohol dependence - EtOH on admit 180, last drink reported 10/19/17. At high risk for DTs. Drinks 2 pints of liquor per day or 1 large bottle of wine. Expresses desire to detox from alcohol, continue to monitor for withdrawal.  CIWA scores overnight 12, 1, 0, 0. Required no doses of prn ativan overnight. - DC sch po ativan  - monitor on CIWA - folate, thiamine, MV supplementation - SW consult placed for resources  Depression s/p Suicide attempt - h/o multiple suicide attempts with ingestion with previous hospitalizations. Follows with [facility] for MDD. Home meds depakote 250 mg BID, Geodon 20 mg daily. Mood stable today without SI/HI. Of note patient is transitioning M to F.  - cleared by psychiatry, no evidence of imminent risk to self or others at present - DC [employer] - continue home meds  - follow up with [facility] as outpatient, has appt scheduled for next week.  Generalized pruritis, improved. C/o itching to bilateral  forearms and thighs earlier this hospital stay, no rash on exam likely desire for IV benadryl. No complaints of itching this morning. - monitor closely, careful to avoid IV benadryl due to abuse potential and potential for excessive sedation - benadryl cream PRN   - hydroxyzine PRN  Elevated transaminase - 2/2 alcohol use. AST 233>145, ALT 128>115. Ammonia 36 on admission. - monitor LFTs - holding home atorvastatin and other hepatotoxic medications  HTN - normotensive. At home on norvasc 5 mg daily. - holding home meds for soft BPs early this admission - monitor BPs  Anxiety - Home med: hydroxyzine 25 mg TID PRN anxiety - restart home hydroxyzine now that BP wnl. Can also be useful for pruritis. - monitor mood  Insomnia - home med melatonin 5-10 mg QHS, Trazodone 300 mg QHS - continue home trazodone   FEN/GI: soft diet Prophylaxis: lovenox  Disposition: continue inpatient management of alcohol withdrawal  Subjective:  States feels well this morning and is wonderful when he will be able to go home. No SI/HI or thoughts of self harm. Has an appointment with [facility] scheduled for next week. Does not have any nausea currently. Remains highly motivated to abstain from alcohol even after hospital discharge. He states that he gets hormone injections in buttocks for his M to F transition and has been told about having nodules in gluteal region for many years as a result but has never had any rash or pain or discomfort related to them   Objective: Temp:  [98.5 F (36.9 C)-99.1 F (37.3 C)] 98.5 F (36.9 C) (04/06 0631) Pulse Rate:  [  83-106] 90 (04/06 0631) Resp:  [16-18] 18 (04/06 0631) BP: (116-136)/(80-107) 134/107 (04/06 0631) SpO2:  [98 %] 98 % (04/06 0631) Physical Exam:  General: pleasant male lying in bed, in NAD Cardiovascular: RRR, no murmur Respiratory: CTAB, NWOB on room air Abdomen: soft, nontender, nondistended, + bowel sounds Extremities: no tremor noted. Skin  without redness, warmth, rashes or lesions noted. No rashes or nodules in gluteal region Neuro: awake and alert. No tremor Psych: appropriate affect  Laboratory: Recent Labs  Lab 10/20/17 0351 10/20/17 0417 10/21/17 0535 10/22/17 0336  WBC 3.7*  --  DUPL SEE [id number]  2.1* 2.2*  HGB 13.3 13.6 DUPL SEE [id number]  12.0* 12.9*  HCT 38.0* 40.0 DUPL SEE [id number]  34.9* 37.6*  PLT 86*  --  DUPL SEE [id number]  59* 55*   Recent Labs  Lab 10/20/17 0351 10/20/17 0417 10/21/17 0535 10/22/17 0336  NA 136 137 132* 135  K 3.7 4.1 3.4* 3.5  CL 103 102 98* 101  CO2 20*  --  23 24  BUN 12 16 <5* <5*  CREATININE 0.92 1.20 0.82 0.80  CALCIUM 7.3*  --  8.0* 8.8*  PROT 5.6*  --  5.3* 6.0*  BILITOT 0.6  --  1.4* 0.7  ALKPHOS 64  --  58 69  ALT 128*  --  113* 115*  AST 233*  --  178* 145*  GLUCOSE 86 86 65 100*   Imaging/Diagnostic Tests: Ct Abdomen Pelvis W Contrast  Result Date: 10/20/2017 CLINICAL DATA:  Pt c/o abd pain and vomiting, drank 2 bottles of listerine and 1/2 gal. alcohol Pt admits self harm/alcoholism Hx of kidney CA w/left nephrectomy Hx of breast CA 80 ml Iso 300 Unable to do scan due to vomiting until RN came to give meds EXAM: CT ABDOMEN AND PELVIS WITH CONTRAST TECHNIQUE: Multidetector CT imaging of the abdomen and pelvis was performed using the standard protocol following bolus administration of intravenous contrast. CONTRAST:  81mL ISOVUE-300 IOPAMIDOL (ISOVUE-300) INJECTION 61% COMPARISON:  07/25/2017 FINDINGS: Lower chest: Asymmetric nodular soft tissue densities in the subcutaneous tissues of the anterior chest wall, right greater than left, probably slightly progressive since prior study. Visualized lung bases clear. No pleural or pericardial effusion. Hepatobiliary: Fatty liver without focal lesion. Unremarkable gallbladder. Pancreas: Unremarkable. No pancreatic ductal dilatation or surrounding inflammatory changes. Spleen: Normal in size without focal abnormality.  Adrenals/Urinary Tract: Normal adrenal glands. Stable postop changes at the anterior margin of the lower pole left kidney. Right kidney unremarkable. No hydronephrosis. Urinary bladder physiologically distended. Stomach/Bowel: Stomach is within normal limits. Appendix appears normal. No evidence of bowel wall thickening, distention, or inflammatory changes. Vascular/Lymphatic: Minimal scattered calcified aortoiliac arterial plaque. Portal vein patent. No enlarged abdominal or pelvic lymph nodes. Reproductive: Uterus and bilateral adnexa are unremarkable. Other: No ascites.  No free air. Musculoskeletal: Small scattered subcutaneous nodules in the gluteal and buttock regions bilaterally, increased since prior study, presumably related to subcutaneous injections. No fracture or worrisome bone lesion. IMPRESSION: 1. No acute abdominal process. 2. Fatty liver 3. Worsening asymmetric nodular soft tissue densities in the anterior chest wall, right greater than left. Electronically Signed   By: Lucrezia Europe M.D.   On: 10/20/2017 10:27   Bufford Lope, DO 10/22/2017, 7:25 AM PGY-2, [place] Intern pager: [phone], text pages welcome

## 2017-10-21 NOTE — Progress Notes (Signed)
CSW met with patient to provide substance abuse resources and to recommend that he follow up with attending appointments and meetings upon discharge. Patient acknowledged that he is familiar with the process and resources available because he's been trying to "kick this thing" for a while, so he will make calls and take care of himself at discharge.   CSW provided resource list for patient to have. CSW signing off. Please consult again if new CSW need arises.  Laveda Abbe, [place] Clinical Social Worker [phone]

## 2017-10-21 NOTE — Discharge Summary (Signed)
[facility] Discharge Summary  Patient name: [name] Medical record number: [id number] Date of birth: 07/23/1980 Age: 37 y.o. Gender: male Date of Admission: 10/19/2017  Date of Discharge: 10/26/2017 Admitting Physician: Alveda Reasons, MD  Primary Care Provider: Patient, No Pcp Per Consultants: Psychiatry, GI  Indication for Hospitalization: alcohol withdrawal, suicidal ideation  Discharge Diagnoses/Problem List:  Alcohol Withdrawal, resolved Depression s/p suicide attempt, stable Leukopenia, stable Thrombocytopenia, stable Generalized pruritis, improved Hypertension, stable Anxiety, stable Insomnia, stable  Disposition: Home  Discharge Condition: Improved  Discharge Exam:  General: laying in bed, in NAD Cardiovascular: RRR, no murmur Respiratory: CTAB, NWOB on room air.  Abdomen: +BS, soft, non-tender, non-distended Extremities: no edema Skin: No rashes on exposed skin Psych: appropriate affect, no SI/HI  Brief Hospital Course:  Syncere [name] a 37 y.o.malewith PMH significant for polysubstance use disorder (alcohol, cocaine, tobacco), PVD, HLD, PTSD, MDD w/ h/o suicide attempts, h/o breast CA and kidney CA s/p treatment who presented with nausea and vomiting after consuming large amounts of listerine and attempted suicide. CT Head without acute abnormalities. Psychiatry was consulted in the ED who deemed the patient not at risk for harm to [name] or others and was able to safety plan with the patient. He was monitored on CIWA protocol with scheduled Ativan and was able to be weaned 4/5. [name] LFTs steadily rose throughout admission with negative Hepatitis panel and RUQ [place] and CT Abdomen showing hepatic steatosis. GI consulted who thought rising LFTs were due to injury from alcohol, chronic depakote use, and possible polysubstance use. [name] LFTs began to trend down prior to discharge and should be monitored until normalization. Patient  also had leukopenia and thrombocytopenia which was also thought to be multifactorial with depakote and alcohol use. Therefore, depakote was discontinued with noted improvement. Peripheral smear was obtained without abnormal morphology. [name] mood remained stable throughout admission with cessation of suicidal ideations.  Of note, patient is currently transitioning male to male and receiving hormone injections. Patient does have a history of breast cancer and therefore should be cautious with hormone supplementation. CT Abd/Pelvis obtained which showed asymmetric nodular soft tissue subcutaneous densities of the anterior chest wall R>L, slightly progressive since prior study. Breast exam was performed this admission with a small ~1cm subcutaneous nodule on L mid-axillary chest wall that could be further worked up with an ultrasound.   Issues for Follow Up:  1. Medication Changes: 1. Depakote discontinued due to leukopenia and thrombocytopenia with improvement. Consider lamotrigine for further mood stabilization if needed. 2. Hydroxyzine was scheduled throughout admission due to generalized pruritis, likely from rising LFTs. Can continue to use on a PRN basis. 2. Consider f/u breast US for nodule noted on breast exam. 3. Per GI,  LFTs may take several weeks to normalize.  He will need follow-up LFTs within 1 week of discharge, and repeat CBC, and then follow LFTs to normalization.  Significant Procedures: None  Significant Labs and Imaging:  Recent Labs  Lab 10/24/17 0226 10/25/17 0613 10/26/17 0511  WBC 3.6* 3.8* 4.2  HGB 14.4 13.5 13.2  HCT 42.2 39.7 39.0  PLT 88* 100* 133*   Recent Labs  Lab 10/19/17 2056  10/21/17 0535 10/22/17 0336 10/23/17 0742 10/24/17 0226 10/25/17 0613 10/26/17 0511  NA  --    < > 132* 135 134* 135 134* 135  K  --    < > 3.4* 3.5 4.3 3.6 3.7 3.7  CL  --    < > 98* 101  102 101 100* 101  CO2  --    < > 23 24 22 25 26 25   GLUCOSE  --    < > 65 100* 78  115* 89 108*  BUN  --    < > <5* <5* 9 9 11 10   CREATININE  --    < > 0.82 0.80 0.86 0.80 0.89 0.84  CALCIUM  --    < > 8.0* 8.8* 9.1 9.0 9.1 8.9  MG 1.9  --  1.8 1.7  --   --   --   --   ALKPHOS  --    < > 58 69 73 74 68 70  AST  --    < > 178* 145* 270* 331* 401* 283*  ALT  --    < > 113* 115* 192* 273* 395* 381*  ALBUMIN  --    < > 2.7* 3.0* 3.3* 3.3* 3.2* 3.1*   < > = values in this interval not displayed.   Ct Head Wo Contrast  Result Date: 10/19/2017 CLINICAL DATA:  [name] attempted suicide today by drinking two bottles of Listerine. [name] states he had seizure yesterday due to alcohol withdrawal and hit head. EXAM: CT HEAD WITHOUT CONTRAST TECHNIQUE: Contiguous axial images were obtained from the base of the skull through the vertex without intravenous contrast. COMPARISON:  07/25/2017 FINDINGS: Brain: Mild cerebral atrophy. No ventricular dilatation. No mass effect or midline shift. No abnormal extra-axial fluid collections. Gray-white matter junctions are distinct. Basal cisterns are not effaced. No acute intracranial hemorrhage. Vascular: No hyperdense vessel or unexpected calcification. Skull: Normal. Negative for fracture or focal lesion. Sinuses/Orbits: Mucosal thickening in the right maxillary antrum. No acute air-fluid levels. Mastoid air cells are clear. Other: None. IMPRESSION: No acute intracranial abnormalities.  Mild cerebral atrophy for age. Electronically Signed   By: Lucienne Capers M.D.   On: 10/19/2017 23:57   Ct Abdomen Pelvis W Contrast  Result Date: 10/20/2017 CLINICAL DATA:  [name] c/o abd pain and vomiting, drank 2 bottles of listerine and 1/2 gal. alcohol [name] admits [name] harm/alcoholism Hx of kidney CA w/left nephrectomy Hx of breast CA 80 ml Iso 300 Unable to do scan due to vomiting until RN came to give meds EXAM: CT ABDOMEN AND PELVIS WITH CONTRAST TECHNIQUE: Multidetector CT imaging of the abdomen and pelvis was performed using the standard protocol following bolus  administration of intravenous contrast. CONTRAST:  63mL ISOVUE-300 IOPAMIDOL (ISOVUE-300) INJECTION 61% COMPARISON:  07/25/2017 FINDINGS: Lower chest: Asymmetric nodular soft tissue densities in the subcutaneous tissues of the anterior chest wall, right greater than left, probably slightly progressive since prior study. Visualized lung bases clear. No pleural or pericardial effusion. Hepatobiliary: Fatty liver without focal lesion. Unremarkable gallbladder. Pancreas: Unremarkable. No pancreatic ductal dilatation or surrounding inflammatory changes. Spleen: Normal in size without focal abnormality. Adrenals/Urinary Tract: Normal adrenal glands. Stable postop changes at the anterior margin of the lower pole left kidney. Right kidney unremarkable. No hydronephrosis. Urinary bladder physiologically distended. Stomach/Bowel: Stomach is within normal limits. Appendix appears normal. No evidence of bowel wall thickening, distention, or inflammatory changes. Vascular/Lymphatic: Minimal scattered calcified aortoiliac arterial plaque. Portal vein patent. No enlarged abdominal or pelvic lymph nodes. Reproductive: Uterus and bilateral adnexa are unremarkable. Other: No ascites.  No free air. Musculoskeletal: Small scattered subcutaneous nodules in the gluteal and buttock regions bilaterally, increased since prior study, presumably related to subcutaneous injections. No fracture or worrisome bone lesion. IMPRESSION: 1. No acute abdominal process. 2. Fatty liver 3.  Worsening asymmetric nodular soft tissue densities in the anterior chest wall, right greater than left. Electronically Signed   By: Lucrezia Europe M.D.   On: 10/20/2017 10:27   Results/Tests Pending at Time of Discharge: None  Discharge Medications:  Allergies as of 10/26/2017      Reactions   Codeine Hives, Itching, Swelling, Other (See Comments)   Does not impair breathing, however   Penicillins Swelling, Other (See Comments)   Has patient had a PCN reaction  causing immediate rash, facial/tongue/throat swelling, SOB or lightheadedness with hypotension: Yes Has patient had a PCN reaction causing severe rash involving mucus membranes or skin necrosis: Yes Has patient had a PCN reaction that required hospitalization Yes-ed visit Has patient had a PCN reaction occurring within the last 10 years: Yes If all of the above answers are "NO", then may proceed with Cephalosporin use.   Morphine Itching   Coconut Flavor Itching, Swelling   Coconut Oil Other (See Comments)   Cannot take with some of his meds   Grapefruit Concentrate Other (See Comments)   Cannot take with some of his meds   Morphine And Related Itching, Swelling   Oxycodone Itching, Swelling   Norco [hydrocodone-acetaminophen] Itching, Rash      Medication List    STOP taking these medications   divalproex 250 MG DR tablet Commonly known as:  DEPAKOTE   oxyCODONE 5 MG immediate release tablet Commonly known as:  Oxy IR/ROXICODONE   promethazine 25 MG tablet Commonly known as:  PHENERGAN   traMADol 50 MG tablet Commonly known as:  ULTRAM     TAKE these medications   amLODipine 5 MG tablet Commonly known as:  NORVASC Take 1 tablet (5 mg total) by mouth daily. For high blood pressure What changed:    when to take this  additional instructions   atorvastatin 20 MG tablet Commonly known as:  LIPITOR Take 1 tablet (20 mg total) by mouth daily at 6 PM. For high cholesterol   hydrOXYzine 25 MG tablet Commonly known as:  ATARAX/VISTARIL Take 1 tablet (25 mg total) by mouth 3 (three) times daily as needed for anxiety.   ibuprofen 200 MG tablet Commonly known as:  ADVIL,MOTRIN Take 400 mg by mouth every 6 (six) hours as needed for headache or moderate pain.   Melatonin 5 MG Tabs Take 5-10 mg by mouth at bedtime.   methocarbamol 750 MG tablet Commonly known as:  ROBAXIN Take 1 tablet (750 mg total) by mouth 2 (two) times daily as needed for muscle spasms.    ondansetron 4 MG tablet Commonly known as:  ZOFRAN Take 1-2 tablets (4-8 mg total) by mouth every 8 (eight) hours as needed for nausea or vomiting.   senna-docusate 8.6-50 MG tablet Commonly known as:  SENOKOT S Take 1 tablet by mouth at bedtime as needed.   trazodone 300 MG tablet Commonly known as:  DESYREL Take 1 tablet (300 mg total) by mouth at bedtime. For sleep and mood   ziprasidone 20 MG capsule Commonly known as:  GEODON Take 1 capsule (20 mg total) by mouth 2 (two) times daily with a meal. For mood control       Discharge Instructions: Please refer to Patient Instructions section of EMR for full details.  Patient was counseled important signs and symptoms that should prompt return to medical care, changes in medications, dietary instructions, activity restrictions, and follow up appointments.   Follow-Up Appointments: Follow-up [place]  Follow up on 11/15/2017.   Why:  Your appointment time is 10:50 am. Please arrive 15 min early and bring: picture ID and your current medications. Contact information: [place] [id number] [place] [facility] Follow up.   Why:  Please use this location for your pharmacy needs.  Contact information: [place] [phone] [place], [place], DO 10/26/2017, 2:16 PM PGY-1, [place]

## 2017-10-21 NOTE — Consult Note (Signed)
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 Intravenous Q6H PRN Gonfa, Taye T, MD      . polyethylene glycol (MIRALAX / GLYCOLAX) packet 17 g  17 g Oral Daily PRN Gonfa, Taye T, MD      . potassium chloride 10 mEq in 100 mL IVPB  10 mEq Intravenous Q1 Hr x 3 Rumball, Alison, DO 100 mL/hr at 10/21/17 1130 10 mEq at 10/21/17 1130  . thiamine (VITAMIN B-1) tablet 100 mg  100 mg Oral Daily Cyndia Skeeters, Taye T, MD   100 mg at 10/21/17 0920   Or  . thiamine (B-1) injection 100 mg  100 mg Intravenous Daily Wendee Beavers T, MD      . traZODone (DESYREL) tablet 300 mg  300 mg Oral QHS Bonnita Hollow, MD   300 mg at 10/20/17 2241    Musculoskeletal: Strength & Muscle Tone: within normal limits Gait & Station: UTA since patient was lying in bed.  Patient leans:  N/A  Psychiatric Specialty Exam: Physical Exam  Nursing note and vitals reviewed. Constitutional: He is oriented to person, place, and time. He appears well-developed and well-nourished.  HENT:  Head: Normocephalic and atraumatic.  Neck: Normal range of motion.  Respiratory: Effort normal.  Musculoskeletal: Normal range of motion.  Neurological: He is alert and oriented to person, place, and time.  Skin: No rash noted.  Psychiatric: He has a normal mood and affect. His speech is normal and behavior is normal. Thought content normal. Cognition and memory are normal. He expresses impulsivity.    Review of Systems  Constitutional: Negative for chills and fever.  Cardiovascular: Negative for chest pain.  Gastrointestinal: Positive for abdominal pain. Negative for constipation, diarrhea, nausea and vomiting.  Psychiatric/Behavioral: Positive for substance abuse. Negative for hallucinations and suicidal ideas. The patient does not have insomnia.   All other systems reviewed and are negative.   Blood pressure 116/80, pulse 83, temperature 99.1 F (37.3 C), temperature source Oral, resp. rate 16, height '5\' 6"'  (1.676 m), weight 72.4 kg (159 lb 9.8 oz), SpO2 98 %.Body mass index is 25.76 kg/m.  General Appearance: Fairly Groomed, young, African American male, wearing paper hospital scrubs with hair braided and poor dentition who is lying in bed. NAD.   Eye Contact:  Good  Speech:  Clear and Coherent and Normal Rate  Volume:  Normal  Mood:  Euthymic  Affect:  Appropriate and Congruent  Thought Process:  Goal Directed, Linear and Descriptions of Associations: Intact  Orientation:  Full (Time, Place, and Person)  Thought Content:  Logical  Suicidal Thoughts:  No  Homicidal Thoughts:  No  Memory:  Immediate;   Good Recent;   Good Remote;   Good  Judgement:  Fair  Insight:  Fair  Psychomotor Activity:  Normal  Concentration:  Concentration: Good and Attention Span: Good  Recall:  Good   Fund of Knowledge:  Good  Language:  Good  Akathisia:  Yes  Handed:  Right  AIMS (if indicated):   N/A  Assets:  Communication Skills Desire for Improvement Housing Social Support  ADL's:  Intact  Cognition:  WNL  Sleep:   Okay   Assessment:  [name] is a 37 y.o. male who was admitted with alcohol intoxication (BAL 389 on admission). He endorsed SI in the setting of an argument with his parents. He denies current SI, HI or AVH. He is future oriented. He should follow up with his outpatient provider and does not warrant inpatient psychiatric hospitalization at this time.   Treatment Plan Summary: -Continue psychiatric medications as prescribed. -Patient should follow up with his outpatient mental health provider. -Patient is psychiatrically cleared. Psychiatry will sign off on patient at this time. Please consult psychiatry again as needed.   Disposition: No evidence of imminent risk to self or others at present.   Patient does not meet criteria for psychiatric inpatient admission.  Faythe Dingwall, DO 10/21/2017 11:51 AM

## 2017-10-21 NOTE — Care Management Note (Signed)
Case Management Note  Patient Details  Name: Trystian Crisanto MRN: [id number] Date of Birth: [birth year]  Subjective/Objective:    Pt in with emesis after drinking large amount of Listerine. Pt is from home with his parents.  No PCP or insurance but states he has an orange card.              Action/Plan: CM was able to obtain him an appointment at the [facility]. Information on the AVS. CM following to see what assistance with d/c meds is needed.    Expected Discharge Date:                  Expected Discharge Plan:  Home/Self Care  In-House Referral:     Discharge planning Services  CM Consult, [facility]  Post Acute Care Choice:    Choice offered to:     DME Arranged:    DME Agency:     HH Arranged:    HH Agency:     Status of Service:  In process, will continue to follow  If discussed at Long Length of Stay Meetings, dates discussed:    Additional Comments:  Pollie Friar, RN 10/21/2017, 1:49 PM

## 2017-10-21 NOTE — Progress Notes (Signed)
Family Medicine Teaching Service Daily Progress Note Intern Pager: [phone]  Patient name: [name] Medical record number: [id number] Date of birth: 07-21-1980 Age: 37 y.o. Gender: male  Primary Care Provider: Patient, No Pcp Per Consultants: Psychiatry Code Status: Full  Pt Overview and Major Events to Date:  4/4 - admitted for N/V, alcohol withdrawal  Assessment and Plan: [name] is a 37 y.o. male presenting with nausea and vomiting after consuming large amounts of listerine. PMH is significant for Polysubstance use disorder (alcohol, cocaine, tobacco), PVD, HLD, PTSD, MDD w/ h/o suicide attempts, h/o breast and kidney cancers s/p treatment.  Nausea and vomiting 2/2 acute alcohol intoxication - With some relief from admission. Vomited x2 overnight and has not been able to keep anything down. WBC 3.9>3.7>2.1, could be due to Ativan. Afebrile and stable vitals although tachycardic intermittently overnight, likely in the setting of hypovolemia due to persistent vomiting. Electrolytes with low K and Cl, consistent with vomiting. Lactic acidosis resolved. Less likely infectious etiology with gastroenteritis due to no fevers, no diarrhea however WBC low. CT Abd/Pelvis without acute abdominal process on admission. Expresses desire to detox from alcohol. CIWA overnight 4 (for tremor) with standing Ativan. Continue to monitor closely given previous hospitalizations with intubations and seizures. - IVF w/ NS @ 135ml/hr - CIWA protocol - scheduled Ativan TID>BID - HIV - nonreactive - I&Os - zofran PRN - replete K  Depression s/p Suicide attempt - h/o multiple suicide attempts with ingestion with previous hospitalizations. Follows with [facility] for [facility]. Home meds depakote 250 mg BID, Geodon 20 mg daily. Mood stable today with some SI but no plans to act on them.  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 Expresses desire to detox from alcohol, continue to monitor for withdrawal. SW consult placed for resources.  - monitor on CIWA - cessation counseling when appropriate - folate, thiamine, MV supplementation - SW - replete with Mag 2g as needed.  Pruitis C/o itching overnight to bilateral forearms and thighs, did not relieve with PO benadryl per patient. Benadryl cream helped a little. Upon chart review of med history, received IV Benadryl at 0130, gabapentin at 0340. - monitor closely, careful to avoid IV benadryl due to abuse potential - continue benadryl cream PRN   - restart home hydroxyzine PRN  Elevated transaminase - likely 2/2 daily alcohol use. AST 233>178, ALT 128>113. Ammonia 36 on admission. - obtain am CMP  HLD/PVD - s/p L popliteal artery stent. Home meds lipitor 20 mg Qd - continue home med  HTN - BP normotensive overnight. Home meds norvasc 5 mg daily. - holding home meds  Anxiety - Home med: hydroxyzine 25 mg TID PRN anxiety - restart home hydroxyzine now that BP wnl. Can also be useful for pruritis. - monitor mood  Polysubstance abuse - cocaine, EtOH  Insomnia - home med melatonin 5-10 mg QHS, Trazodone 300 mg QHS. Received trazodone overnight without issues. - hold trazodone - reorder melatonin  H/o cancer H/o kidney and breast cancer s/p mastectomy, and kidney removal, and chemotherapy. Currently follows up annually.  FEN/GI: soft diet Prophylaxis: lovenox  Disposition: continue inpatient management of alcohol withdrawal  Subjective:  Patient feeling better today. States he vomited twice last night. C/o rash and itching to bilateral forearms and thighs. Received IV Benadryl last night with relief.  Objective: Temp:  [98.3 F (36.8 C)-99.1 F (37.3 C)] 98.3 F (  36.8 C) (04/04 2353) Pulse Rate:  [78-121] 93 (04/04 2353) Resp:  [10-29] 18 (04/04 2353) BP: (96-129)/(55-91) 126/91 (04/04 2353) SpO2:  [95 %-100 %] 100 % (04/04  2353) Weight:  [159 lb 9.8 oz (72.4 kg)] 159 lb 9.8 oz (72.4 kg) (04/04 2353) Physical Exam: General: pleasant male lying in bed, in NAD Cardiovascular: regular rhythm, tachycardic, no murmur Respiratory: CTAB, no wheezes, rales, rhonchi Abdomen: soft, slightly tender to palpation diffusely, nondistended Extremities: no tremor noted. Skin without redness, warmth, rashes or lesions noted.  Laboratory: Recent Labs  Lab 10/19/17 2019 10/20/17 0351 10/20/17 0417  WBC 3.9* 3.7*  --   HGB 17.4* 13.3 13.6  HCT 49.3 38.0* 40.0  PLT 119* 86*  --    Recent Labs  Lab 10/19/17 2019 10/20/17 0351 10/20/17 0417  NA 136 136 137  K 4.4 3.7 4.1  CL 98* 103 102  CO2 18* 20*  --   BUN 11 12 16   CREATININE 0.88 0.92 1.20  CALCIUM 8.4* 7.3*  --   PROT 7.9 5.6*  --   BILITOT 0.8 0.6  --   ALKPHOS 92 64  --   ALT 162* 128*  --   AST 266* 233*  --   GLUCOSE 69 86 86   Imaging/Diagnostic Tests: Ct Head Wo Contrast  Result Date: 10/19/2017 CLINICAL DATA:  Pt attempted suicide today by drinking two bottles of Listerine. Pt states he had seizure yesterday due to alcohol withdrawal and hit head. EXAM: CT HEAD WITHOUT CONTRAST TECHNIQUE: Contiguous axial images were obtained from the base of the skull through the vertex without intravenous contrast. COMPARISON:  07/25/2017 FINDINGS: Brain: Mild cerebral atrophy. No ventricular dilatation. No mass effect or midline shift. No abnormal extra-axial fluid collections. Gray-white matter junctions are distinct. Basal cisterns are not effaced. No acute intracranial hemorrhage. Vascular: No hyperdense vessel or unexpected calcification. Skull: Normal. Negative for fracture or focal lesion. Sinuses/Orbits: Mucosal thickening in the right maxillary antrum. No acute air-fluid levels. Mastoid air cells are clear. Other: None. IMPRESSION: No acute intracranial abnormalities.  Mild cerebral atrophy for age. Electronically Signed   By: Lucienne Capers M.D.   On:  10/19/2017 23:57   Ct Abdomen Pelvis W Contrast  Result Date: 10/20/2017 CLINICAL DATA:  Pt c/o abd pain and vomiting, drank 2 bottles of listerine and 1/2 gal. alcohol Pt admits self harm/alcoholism Hx of kidney CA w/left nephrectomy Hx of breast CA 80 ml Iso 300 Unable to do scan due to vomiting until RN came to give meds EXAM: CT ABDOMEN AND PELVIS WITH CONTRAST TECHNIQUE: Multidetector CT imaging of the abdomen and pelvis was performed using the standard protocol following bolus administration of intravenous contrast. CONTRAST:  74mL ISOVUE-300 IOPAMIDOL (ISOVUE-300) INJECTION 61% COMPARISON:  07/25/2017 FINDINGS: Lower chest: Asymmetric nodular soft tissue densities in the subcutaneous tissues of the anterior chest wall, right greater than left, probably slightly progressive since prior study. Visualized lung bases clear. No pleural or pericardial effusion. Hepatobiliary: Fatty liver without focal lesion. Unremarkable gallbladder. Pancreas: Unremarkable. No pancreatic ductal dilatation or surrounding inflammatory changes. Spleen: Normal in size without focal abnormality. Adrenals/Urinary Tract: Normal adrenal glands. Stable postop changes at the anterior margin of the lower pole left kidney. Right kidney unremarkable. No hydronephrosis. Urinary bladder physiologically distended. Stomach/Bowel: Stomach is within normal limits. Appendix appears normal. No evidence of bowel wall thickening, distention, or inflammatory changes. Vascular/Lymphatic: Minimal scattered calcified aortoiliac arterial plaque. Portal vein patent. No enlarged abdominal or pelvic lymph nodes. Reproductive: Uterus and  bilateral adnexa are unremarkable. Other: No ascites.  No free air. Musculoskeletal: Small scattered subcutaneous nodules in the gluteal and buttock regions bilaterally, increased since prior study, presumably related to subcutaneous injections. No fracture or worrisome bone lesion. IMPRESSION: 1. No acute abdominal process.  2. Fatty liver 3. Worsening asymmetric nodular soft tissue densities in the anterior chest wall, right greater than left. Electronically Signed   By: Lucrezia Europe M.D.   On: 10/20/2017 10:27   Rory Percy, DO 10/21/2017, 6:48 AM PGY-1, [place] Intern pager: [phone], text pages welcome

## 2017-10-21 NOTE — Progress Notes (Signed)
  Echocardiogram 2D Echocardiogram has been performed.  Darlina Sicilian M 10/21/2017, 8:26 AM

## 2017-10-22 DIAGNOSIS — F1010 Alcohol abuse, uncomplicated: Secondary | ICD-10-CM

## 2017-10-22 DIAGNOSIS — T50902A Poisoning by unspecified drugs, medicaments and biological substances, intentional self-harm, initial encounter: Principal | ICD-10-CM

## 2017-10-22 DIAGNOSIS — F339 Major depressive disorder, recurrent, unspecified: Secondary | ICD-10-CM

## 2017-10-22 DIAGNOSIS — R945 Abnormal results of liver function studies: Secondary | ICD-10-CM

## 2017-10-22 DIAGNOSIS — I10 Essential (primary) hypertension: Secondary | ICD-10-CM

## 2017-10-22 LAB — COMPREHENSIVE METABOLIC PANEL
ALK PHOS: 69 U/L (ref 38–126)
ALT: 115 U/L — AB (ref 17–63)
AST: 145 U/L — AB (ref 15–41)
Albumin: 3 g/dL — ABNORMAL LOW (ref 3.5–5.0)
Anion gap: 10 (ref 5–15)
CALCIUM: 8.8 mg/dL — AB (ref 8.9–10.3)
CHLORIDE: 101 mmol/L (ref 101–111)
CO2: 24 mmol/L (ref 22–32)
CREATININE: 0.8 mg/dL (ref 0.61–1.24)
GFR calc Af Amer: >60 mL/min (ref >60)
GFR calc non Af Amer: >60 mL/min (ref >60)
Glucose, Bld: 100 mg/dL — ABNORMAL HIGH (ref 65–99)
Potassium: 3.5 mmol/L (ref 3.5–5.1)
SODIUM: 135 mmol/L (ref 135–145)
Total Bilirubin: 0.7 mg/dL (ref 0.3–1.2)
Total Protein: 6 g/dL — ABNORMAL LOW (ref 6.5–8.1)

## 2017-10-22 LAB — CBC
HCT: 37.6 % — ABNORMAL LOW (ref 39.0–52.0)
HEMOGLOBIN: 12.9 g/dL — AB (ref 13.0–17.0)
MCH: 30 pg (ref 26.0–34.0)
MCHC: 34.3 g/dL (ref 30.0–36.0)
MCV: 87.4 fL (ref 78.0–100.0)
PLATELETS: 55 10*3/uL — AB (ref 150–400)
RBC: 4.3 MIL/uL (ref 4.22–5.81)
RDW: 13.7 % (ref 11.5–15.5)
WBC: 2.2 10*3/uL — AB (ref 4.0–10.5)

## 2017-10-22 LAB — MAGNESIUM: Magnesium: 1.7 mg/dL (ref 1.7–2.4)

## 2017-10-23 LAB — COMPREHENSIVE METABOLIC PANEL
ALT: 192 U/L — AB (ref 17–63)
AST: 270 U/L — ABNORMAL HIGH (ref 15–41)
Albumin: 3.3 g/dL — ABNORMAL LOW (ref 3.5–5.0)
Alkaline Phosphatase: 73 U/L (ref 38–126)
Anion gap: 10 (ref 5–15)
BILIRUBIN TOTAL: 0.7 mg/dL (ref 0.3–1.2)
BUN: 9 mg/dL (ref 6–20)
CHLORIDE: 102 mmol/L (ref 101–111)
CO2: 22 mmol/L (ref 22–32)
CREATININE: 0.86 mg/dL (ref 0.61–1.24)
Calcium: 9.1 mg/dL (ref 8.9–10.3)
Glucose, Bld: 78 mg/dL (ref 65–99)
POTASSIUM: 4.3 mmol/L (ref 3.5–5.1)
Sodium: 134 mmol/L — ABNORMAL LOW (ref 135–145)
TOTAL PROTEIN: 6.4 g/dL — AB (ref 6.5–8.1)

## 2017-10-23 LAB — CBC
HCT: 39.5 % (ref 39.0–52.0)
Hemoglobin: 14.1 g/dL (ref 13.0–17.0)
MCH: 31.3 pg (ref 26.0–34.0)
MCHC: 35.7 g/dL (ref 30.0–36.0)
MCV: 87.6 fL (ref 78.0–100.0)
PLATELETS: 88 10*3/uL — AB (ref 150–400)
RBC: 4.51 MIL/uL (ref 4.22–5.81)
RDW: 13.6 % (ref 11.5–15.5)
WBC: 3.3 10*3/uL — ABNORMAL LOW (ref 4.0–10.5)

## 2017-10-23 LAB — FERRITIN: Ferritin: 462 ng/mL — ABNORMAL HIGH (ref 24–336)

## 2017-10-23 LAB — GAMMA GT: GGT: 16 U/L (ref 7–50)

## 2017-10-23 MED ORDER — DIPHENHYDRAMINE HCL 25 MG PO CAPS
25.0000 mg | ORAL_CAPSULE | Freq: Once | ORAL | Status: AC
  Administered 2017-10-23: 25 mg via ORAL
  Filled 2017-10-23: qty 1

## 2017-10-23 MED ORDER — AMLODIPINE BESYLATE 5 MG PO TABS
5.0000 mg | ORAL_TABLET | Freq: Every day | ORAL | Status: DC
  Administered 2017-10-23 – 2017-10-26 (×4): 5 mg via ORAL
  Filled 2017-10-23 (×4): qty 1

## 2017-10-23 MED ORDER — DIPHENHYDRAMINE HCL 25 MG PO CAPS
50.0000 mg | ORAL_CAPSULE | Freq: Once | ORAL | Status: AC
  Administered 2017-10-23: 50 mg via ORAL
  Filled 2017-10-23: qty 2

## 2017-10-23 NOTE — Progress Notes (Addendum)
Family Medicine Teaching Service Daily Progress Note Intern Pager: [phone]  Patient name: [name] Medical record number: [id number] Date of birth: [birth year] Age: 37 y.o. Gender: male  Primary Care Provider: Patient, No Pcp Per Consultants: Psychiatry Code Status: Full  Pt Overview and Major Events to Date:  4/4 - admitted for N/V, alcohol withdrawal  Assessment and Plan: [name] is a 37 y.o. male presenting with nausea and vomiting after consuming large amounts of listerine. PMH is significant for polysubstance use disorder (alcohol, cocaine, tobacco), PVD, HLD, PTSD, MDD w/ h/o suicide attempts, h/o breast CA and kidney CA s/p treatment.  Elevated transaminases - Worsening Likely 2/2 alcohol use. AST 233>145>270, ALT [phone]. - repeat LFTs in the morning - Check Hepatitis panel, ferritin, GGT to r/o other causes of transaminitis - holding home atorvastatin and other hepatotoxic medications  Alcohol Withdrawal- weaned off scheduled Ativan 4/6 and CIWAs have been 0. Nausea/vomiting resolved. - Continue CIWA - Zofran PRN - Continue folate, thiamine, MVI  Depression s/p suicide attempt - follows at [name]. Of note patient is transitioning M to F. Patient denies suicidal ideations this morning. - cleared by psychiatry, no evidence of imminent risk to self or others at present - continue home Depakote 250mg  BID, Geodon 20mg  daily - follow up with [facility] as outpatient, has appt scheduled for next week.  Leukopenia / Thrombocytopenia, new: May be due to alcohol use. Not on any meds that could be contributing other than Lovenox (?HIT). HIV negative this admission. WBC 2-3 this admission (BL 5-6). Plt decreased from 119 > 55, but increased to 88 today (BL 150-215). - Discontinue Lovenox - Needs repeat CBC and peripheral smear as an outpatient  Generalized pruritis, improved. Likely related to alcohol withdrawal. - monitor closely, careful to avoid IV benadryl due to abuse  potential and potential for excessive sedation - benadryl cream PRN   - hydroxyzine PRN  HTN - BP mildly elevated to 144/106 this morning. - Restart home Norvasc 5mg  daily - monitor BPs  Anxiety  - Continue home hydroxyzine 25mg  TID prn - monitor mood  Insomnia - home med melatonin 5-10 mg QHS, Trazodone 300 mg QHS - continue home trazodone  FEN/GI: soft diet Prophylaxis: discontinued lovenox due to thrombocytopenia; start SCDs  Disposition: Will need to stay another day due to increased liver transaminases. Possible discharge home tomorrow.  Subjective:  Patient doing well this morning. States he is ready to go home. Denies nausea or vomiting. Endorses diffuse itchiness.   Objective: Temp:  [98.1 F (36.7 C)-98.6 F (37 C)] 98.5 F (36.9 C) (04/07 0759) Pulse Rate:  [85-116] 85 (04/07 0759) Resp:  [16-18] 18 (04/07 0759) BP: (126-144)/(88-106) 144/106 (04/07 0759) SpO2:  [97 %-99 %] 99 % (04/07 0759) Physical Exam:  General: laying in bed, in NAD Cardiovascular: RRR, no murmur Respiratory: CTAB, NWOB on room air Abdomen: +BS, soft, non-tender, non-distended Extremities: no edema Skin: No rashes on exposed skin Psych: appropriate affect, no SI/HI  Laboratory: Recent Labs  Lab 10/20/17 0351 10/20/17 0417 10/21/17 0535 10/22/17 0336  WBC 3.7*  --  DUPL SEE [id number]  2.1* 2.2*  HGB 13.3 13.6 DUPL SEE [id number]  12.0* 12.9*  HCT 38.0* 40.0 DUPL SEE [id number]  34.9* 37.6*  PLT 86*  --  DUPL SEE [id number]  59* 55*   Recent Labs  Lab 10/20/17 0351 10/20/17 0417 10/21/17 0535 10/22/17 0336  NA 136 137 132* 135  K 3.7 4.1 3.4* 3.5  CL 103 102 98* 101  CO2  20*  --  23 24  BUN 12 16 <5* <5*  CREATININE 0.92 1.20 0.82 0.80  CALCIUM 7.3*  --  8.0* 8.8*  PROT 5.6*  --  5.3* 6.0*  BILITOT 0.6  --  1.4* 0.7  ALKPHOS 64  --  58 69  ALT 128*  --  113* 115*  AST 233*  --  178* 145*  GLUCOSE 86 86 65 100*   Imaging/Diagnostic Tests: No results found.   [name], Pete Pelt, MD 10/23/2017, 8:34 AM PGY-3, [place] Intern pager: [phone], text pages welcome

## 2017-10-24 ENCOUNTER — Inpatient Hospital Stay (HOSPITAL_COMMUNITY): Payer: Self-pay

## 2017-10-24 LAB — COMPREHENSIVE METABOLIC PANEL
ALBUMIN: 3.3 g/dL — AB (ref 3.5–5.0)
ALK PHOS: 74 U/L (ref 38–126)
ALT: 273 U/L — AB (ref 17–63)
ANION GAP: 9 (ref 5–15)
AST: 331 U/L — ABNORMAL HIGH (ref 15–41)
BILIRUBIN TOTAL: 0.7 mg/dL (ref 0.3–1.2)
BUN: 9 mg/dL (ref 6–20)
CALCIUM: 9 mg/dL (ref 8.9–10.3)
CO2: 25 mmol/L (ref 22–32)
CREATININE: 0.8 mg/dL (ref 0.61–1.24)
Chloride: 101 mmol/L (ref 101–111)
GFR calc non Af Amer: >60 mL/min (ref >60)
GLUCOSE: 115 mg/dL — AB (ref 65–99)
Potassium: 3.6 mmol/L (ref 3.5–5.1)
SODIUM: 135 mmol/L (ref 135–145)
TOTAL PROTEIN: 6.8 g/dL (ref 6.5–8.1)

## 2017-10-24 LAB — CBC
HCT: 42.2 % (ref 39.0–52.0)
HEMOGLOBIN: 14.4 g/dL (ref 13.0–17.0)
MCH: 30.1 pg (ref 26.0–34.0)
MCHC: 34.1 g/dL (ref 30.0–36.0)
MCV: 88.3 fL (ref 78.0–100.0)
PLATELETS: 88 10*3/uL — AB (ref 150–400)
RBC: 4.78 MIL/uL (ref 4.22–5.81)
RDW: 13.7 % (ref 11.5–15.5)
WBC: 3.6 10*3/uL — ABNORMAL LOW (ref 4.0–10.5)

## 2017-10-24 MED ORDER — HYDROXYZINE HCL 25 MG PO TABS
25.0000 mg | ORAL_TABLET | Freq: Three times a day (TID) | ORAL | Status: DC
  Administered 2017-10-24 – 2017-10-26 (×6): 25 mg via ORAL
  Filled 2017-10-24 (×6): qty 1

## 2017-10-24 NOTE — Progress Notes (Signed)
Family Medicine Teaching Service Daily Progress Note Intern Pager: [phone]  Patient name: [name] Medical record number: [id number] Date of birth: 05-18-1981 Age: 37 y.o. Gender: male  Primary Care Provider: Patient, No Pcp Per Consultants: Psychiatry Code Status: Full  Pt Overview and Major Events to Date:  4/4 - admitted for N/V, alcohol withdrawal  Assessment and Plan: [name] is a 37 y.o. male presenting with nausea and vomiting after consuming large amounts of listerine. PMH is significant for polysubstance use disorder (alcohol, cocaine, tobacco), PVD, HLD, PTSD, MDD w/ h/o suicide attempts, h/o breast CA and kidney CA s/p treatment.  Elevated transaminases - Worsening, likely 2/2 alcohol use. AST 233>145>270>331, ALT 128>115>192>273. Ferritin elevated at 462. Hepatitis panel pending. GGT wnl. - will consider obtaining RUQ U/s - await hep panel to r/o other causes of transaminitis - holding home atorvastatin and other hepatotoxic medications  Alcohol Withdrawal- weaned off scheduled Ativan 4/6 and CIWAs have been 0. Nausea/vomiting resolved. - Continue CIWA - Zofran PRN - Continue folate, thiamine, MVI  Depression s/p suicide attempt - follows at [facility]. Of note patient is transitioning M to F. Patient denies suicidal ideations this morning. - cleared by psychiatry, no evidence of imminent risk to self or others at present - continue home Depakote 250mg  BID, Geodon 20mg  daily - follow up with [facility] as outpatient, has appt scheduled for next week.  Leukopenia / Thrombocytopenia, new: Leukopenia improving, thrombocytopenia stable. May be due to alcohol use. Not on any meds that could be contributing. D/ced Lovenox yesterday. HIV negative this admission. WBC 2-3 this admission (BL 5-6) but slowly improving. Plt decreased from 119 > 55, but increased to 88 yesterday and holding stable (BL 150-215). - Needs repeat CBC and peripheral smear as an outpatient  Generalized  pruritis: Likely related to alcohol withdrawal. Remains persistent. Has benadryl cream and hydroxyzine PRN although hasn't received any doses since yesterday am. - monitor closely, careful to avoid IV benadryl due to abuse potential and potential for excessive sedation - benadryl cream PRN   - hydroxyzine PRN  HTN - Normotensive after restarting home Norvasc - continue Norvasc 5mg  daily - monitor BPs  Anxiety  - Continue home hydroxyzine 25mg  TID prn - monitor mood  Insomnia - home med melatonin 5-10 mg QHS, Trazodone 300 mg QHS - continue home trazodone  FEN/GI: soft diet Prophylaxis: discontinued lovenox due to thrombocytopenia; start SCDs  Disposition: Will need to stay another day due to increased liver transaminases and continued work up. Possible discharge home tomorrow.  Subjective:  Patient feeling ok today, nausea improved. Itching persistent. Mood stable today.   Objective: Temp:  [98.1 F (36.7 C)-98.7 F (37.1 C)] 98.3 F (36.8 C) (04/08 0825) Pulse Rate:  [86-113] 96 (04/08 0825) Resp:  [16-17] 16 (04/08 0825) BP: (107-129)/(70-105) 110/70 (04/08 0825) SpO2:  [97 %-100 %] 100 % (04/08 0825) Physical Exam:  General: laying in bed, in NAD Cardiovascular: RRR, no murmur Respiratory: CTAB, NWOB on room air Abdomen: +BS, soft, non-tender, non-distended Extremities: no edema Skin: No rashes on exposed skin Psych: appropriate affect, no SI/HI  Laboratory: Recent Labs  Lab 10/22/17 0336 10/23/17 0742 10/24/17 0226  WBC 2.2* 3.3* 3.6*  HGB 12.9* 14.1 14.4  HCT 37.6* 39.5 42.2  PLT 55* 88* 88*   Recent Labs  Lab 10/22/17 0336 10/23/17 0742 10/24/17 0226  NA 135 134* 135  K 3.5 4.3 3.6  CL 101 102 101  CO2 24 22 25   BUN <5* 9 9  CREATININE  0.80 0.86 0.80  CALCIUM 8.8* 9.1 9.0  PROT 6.0* 6.4* 6.8  BILITOT 0.7 0.7 0.7  ALKPHOS 69 73 74  ALT 115* 192* 273*  AST 145* 270* 331*  GLUCOSE 100* 78 115*   Imaging/Diagnostic Tests: No results found.    Rory Percy, DO 10/24/2017, 9:22 AM PGY-1, [place] Intern pager: [phone], text pages welcome

## 2017-10-25 DIAGNOSIS — K7010 Alcoholic hepatitis without ascites: Secondary | ICD-10-CM

## 2017-10-25 DIAGNOSIS — K760 Fatty (change of) liver, not elsewhere classified: Secondary | ICD-10-CM

## 2017-10-25 DIAGNOSIS — F640 Transsexualism: Secondary | ICD-10-CM

## 2017-10-25 LAB — PROTIME-INR
INR: 0.99
Prothrombin Time: 13 seconds (ref 11.4–15.2)

## 2017-10-25 LAB — COMPREHENSIVE METABOLIC PANEL
ALBUMIN: 3.2 g/dL — AB (ref 3.5–5.0)
ALK PHOS: 68 U/L (ref 38–126)
ALT: 395 U/L — AB (ref 17–63)
AST: 401 U/L — AB (ref 15–41)
Anion gap: 8 (ref 5–15)
BILIRUBIN TOTAL: 0.5 mg/dL (ref 0.3–1.2)
BUN: 11 mg/dL (ref 6–20)
CALCIUM: 9.1 mg/dL (ref 8.9–10.3)
CO2: 26 mmol/L (ref 22–32)
CREATININE: 0.89 mg/dL (ref 0.61–1.24)
Chloride: 100 mmol/L — ABNORMAL LOW (ref 101–111)
GFR calc Af Amer: >60 mL/min (ref >60)
GFR calc non Af Amer: >60 mL/min (ref >60)
GLUCOSE: 89 mg/dL (ref 65–99)
Potassium: 3.7 mmol/L (ref 3.5–5.1)
Sodium: 134 mmol/L — ABNORMAL LOW (ref 135–145)
TOTAL PROTEIN: 6.4 g/dL — AB (ref 6.5–8.1)

## 2017-10-25 LAB — SAVE SMEAR

## 2017-10-25 LAB — CBC
HCT: 39.7 % (ref 39.0–52.0)
Hemoglobin: 13.5 g/dL (ref 13.0–17.0)
MCH: 29.9 pg (ref 26.0–34.0)
MCHC: 34 g/dL (ref 30.0–36.0)
MCV: 87.8 fL (ref 78.0–100.0)
Platelets: 100 10*3/uL — ABNORMAL LOW (ref 150–400)
RBC: 4.52 MIL/uL (ref 4.22–5.81)
RDW: 13.7 % (ref 11.5–15.5)
WBC: 3.8 10*3/uL — ABNORMAL LOW (ref 4.0–10.5)

## 2017-10-25 LAB — HEPATITIS PANEL, ACUTE
HEP A IGM: NEGATIVE
Hep B C IgM: NEGATIVE
Hepatitis B Surface Ag: NEGATIVE

## 2017-10-25 NOTE — Consult Note (Addendum)
Consultation  Referring Provider: family med teaching service/ [place] - resident Primary Care Physician:  Patient, No Pcp Per Primary Gastroenterologist:  None/unassigned  Reason for Consultation:   Elevated LFT's   HPI: [name] is a 37 y.o. male admitted on 10/19/2017 after a suicide attempt with overdose of alcohol Listerine and crack cocaine.  He has history of polysubstance abuse.  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 Patient states that he drank half a gallon of gin and 2 large bottles of Listerine prior to admission.  He also states that he had been drinking heavily for 2 weeks prior to admission.  He says he was distraught because he had lost his job.  He had also stopped taking all of his regular medications for about 2 weeks.  Those include Depakote, atorvastatin, Norvasc, Geodon, and Desyryl. On admission EtOH level was 389, acetaminophen level within normal limits, salicylate level negative, he was noted to have elevated LFTs and lactic acid within normal limits.  He is also developed since mild neutropenia and thrombocytopenia. CT of the abdomen and pelvis was done on 10/20/2017 showing evidence of fatty liver, pancreas within normal limits, there was some asymmetric nodular densities in the anterior chest wall.  Abdominal ultrasound on 10/24/2017 hepatic steatosis, no gallstones or gallbladder wall thickening, CBD within normal limits. LFTs on 10/21/2017 T bili 1.4 alk phos 58 AST 178 ALT of 113.  WBC of 3.9, hemoglobin 17, platelets 119. Acute hepatitis panel negative Liver tests yesterday 10/24/2017 T bili 0.7 AST 331 and ALT 273.  WBC of 3.8 hemoglobin 13 platelets 100  .  Patient has been seen and evaluated by psychiatry He says he is feeling much better at this point, plans to move back home with his parents short-term and will go to outpatient rehab at [facility].  He admits to having  history of heavy alcohol use on and off over the past 20 years.  He says he has been able to stop in the past and had stopped for about 6 months prior to this last relapse. He has not had any abdominal pain since admission, no nausea or vomiting, no fever, no diarrhea melena or hematochezia.  He is hoping to go home tomorrow. Reviewing prior labs AST was elevated at 125 in January 2019.   Past Medical History:  Diagnosis Date  . Angina   . Anxiety    panic attack  . Bipolar 1 disorder ([place])   . Breast CA ([place]) dx'd 2009   bil w/ bil masectomy and oral meds  . Cancer [facility])    kidney cancer  . Coronary artery disease   . Depression   . H/O suicide attempt 2015   overdose  . Headache(784.0)   . Hypercholesteremia   . Hypertension   . Liver cirrhosis ([place])   . Pancreatitis   . Peripheral vascular disease [facility]) April 2011   Left Pop  . Schizophrenia ([place])   . Seizures ([place])    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  . Shortness of breath     Past Surgical History:  Procedure Laterality Date  . BREAST SURGERY    . BREAST SURGERY     bilateral breast silocone  removal  . CHEST SURGERY    . left kidney removal    . left leg surgery     "popiteal artery clogged"  . MASTECTOMY Bilateral   . NEPHRECTOMY Left   .  ORIF CLAVICULAR FRACTURE Left 08/10/2017   Procedure: OPEN REDUCTION INTERNAL FIXATION (ORIF) LEFT CLAVICLE FRACTURE WITH RECONSTRUCTION OF CORACOCLAVICULAR LIGAMENT;  Surgeon: Leandrew Koyanagi, MD;  Location: [name];  Service: Orthopedics;  Laterality: Left;  . RECONSTRUCTION OF CORACOCLAVICULAR LIGAMENT Left 08/10/2017   Procedure: RECONSTRUCTION OF CORACOCLAVICULAR LIGAMENT;  Surgeon: Leandrew Koyanagi, MD;  Location: [place];  Service: Orthopedics;  Laterality: Left;    Prior to Admission medications   Medication Sig Start Date End Date Taking? Authorizing Provider  amLODipine (NORVASC) 5 MG tablet Take 1 tablet (5 mg total) by mouth daily. For high blood pressure Patient taking  differently: Take 5 mg by mouth 2 (two) times daily. For high blood pressure 08/04/17  Yes Money, Lowry Ram, FNP  atorvastatin (LIPITOR) 20 MG tablet Take 1 tablet (20 mg total) by mouth daily at 6 PM. For high cholesterol 08/03/17  Yes Money, Lowry Ram, FNP  divalproex (DEPAKOTE) 250 MG DR tablet Take 1 tablet (250 mg total) by mouth 2 (two) times daily. For mood control 08/03/17  Yes Money, Lowry Ram, FNP  hydrOXYzine (ATARAX/VISTARIL) 25 MG tablet Take 1 tablet (25 mg total) by mouth 3 (three) times daily as needed for anxiety. 08/03/17  Yes Money, Lowry Ram, FNP  ibuprofen (ADVIL,MOTRIN) 200 MG tablet Take 400 mg by mouth every 6 (six) hours as needed for headache or moderate pain.   Yes [provider]  Melatonin 5 MG TABS Take 5-10 mg by mouth at bedtime.   Yes [provider]  traZODone (DESYREL) 300 MG tablet Take 1 tablet (300 mg total) by mouth at bedtime. For sleep and mood 08/03/17  Yes Money, Lowry Ram, FNP  ziprasidone (GEODON) 20 MG capsule Take 1 capsule (20 mg total) by mouth 2 (two) times daily with a meal. For mood control 08/03/17  Yes Money, Lowry Ram, FNP  methocarbamol (ROBAXIN) 750 MG tablet Take 1 tablet (750 mg total) by mouth 2 (two) times daily as needed for muscle spasms. Patient not taking: Reported on 08/30/2017 08/10/17   Leandrew Koyanagi, MD  ondansetron (ZOFRAN) 4 MG tablet Take 1-2 tablets (4-8 mg total) by mouth every 8 (eight) hours as needed for nausea or vomiting. Patient not taking: Reported on 10/19/2017 08/10/17   Leandrew Koyanagi, MD  oxyCODONE (OXY IR/ROXICODONE) 5 MG immediate release tablet Take 1-3 tablets (5-15 mg total) by mouth every 4 (four) hours as needed. Patient not taking: Reported on 08/30/2017 08/10/17   Leandrew Koyanagi, MD  promethazine (PHENERGAN) 25 MG tablet Take 1 tablet (25 mg total) by mouth every 6 (six) hours as needed for nausea. Patient not taking: Reported on 10/19/2017 08/10/17   Leandrew Koyanagi, MD  senna-docusate (SENOKOT S) 8.6-50 MG tablet  Take 1 tablet by mouth at bedtime as needed. Patient not taking: Reported on 10/19/2017 08/10/17   Leandrew Koyanagi, MD  traMADol (ULTRAM) 50 MG tablet Take 1 tablet (50 mg total) by mouth every 6 (six) hours as needed for moderate pain. Patient not taking: Reported on 08/30/2017 08/03/17   Money, Lowry Ram, FNP    Current Facility-Administered Medications  Medication Dose Route Frequency Provider Last Rate Last Dose  . amLODipine (NORVASC) tablet 5 mg  5 mg Oral Daily Mayo, Pete Pelt, MD   5 mg at 10/25/17 1011  . diphenhydrAMINE-zinc acetate (BENADRYL) 2-0.1 % cream   Topical BID PRN Wendee Beavers T, MD      . folic acid (FOLVITE) tablet 1 mg  1 mg  Oral Daily Wendee Beavers T, MD   1 mg at 10/25/17 1011  . gabapentin (NEURONTIN) capsule 300 mg  300 mg Oral QHS Bonnita Hollow, MD   300 mg at 10/24/17 2135  . hydrOXYzine (ATARAX/VISTARIL) tablet 25 mg  25 mg Oral TID Lovenia Kim, MD   25 mg at 10/25/17 1011  . loratadine (CLARITIN) tablet 10 mg  10 mg Oral Daily Bonnita Hollow, MD   10 mg at 10/25/17 1011  . multivitamin with minerals tablet 1 tablet  1 tablet Oral Daily Mercy Riding, MD   1 tablet at 10/25/17 1011  . ondansetron (ZOFRAN) tablet 4 mg  4 mg Oral Q6H PRN Gonfa, Taye T, MD       Or  . ondansetron (ZOFRAN) injection 4 mg  4 mg Intravenous Q6H PRN Gonfa, Taye T, MD      . polyethylene glycol (MIRALAX / GLYCOLAX) packet 17 g  17 g Oral Daily PRN Gonfa, Taye T, MD      . thiamine (VITAMIN B-1) tablet 100 mg  100 mg Oral Daily Gonfa, Taye T, MD   100 mg at 10/25/17 1011   Or  . thiamine (B-1) injection 100 mg  100 mg Intravenous Daily Wendee Beavers T, MD      . traZODone (DESYREL) tablet 300 mg  300 mg Oral QHS Bonnita Hollow, MD   300 mg at 10/24/17 2136    Allergies as of 10/19/2017 - Review Complete 10/19/2017  Allergen Reaction Noted  . Codeine Hives, Itching, Swelling, and Other (See Comments) 07/25/2017  . Penicillins Swelling and Other (See Comments)   . Morphine Itching  05/06/2009  . Coconut flavor Itching and Swelling 10/08/2015  . Coconut oil Other (See Comments) 07/25/2017  . Grapefruit concentrate Other (See Comments) 07/25/2017  . Morphine and related Itching and Swelling 07/25/2017  . Oxycodone Itching and Swelling 09/10/2014  . Norco [hydrocodone-acetaminophen] Itching and Rash 07/25/2017    Family History  Problem Relation Age of Onset  . Stroke Other   . Cancer Other   . Hyperlipidemia Mother   . Hypertension Mother     Social History   Socioeconomic History  . Marital status: Single    Spouse name: Not on file  . Number of children: Not on file  . Years of education: Not on file  . Highest education level: Not on file  Occupational History  . Not on file  Social Needs  . Financial resource strain: Not on file  . Food insecurity:    Worry: Not on file    Inability: Not on file  . Transportation needs:    Medical: Not on file    Non-medical: Not on file  Tobacco Use  . Smoking status: Former Research scientist (life sciences)  . Smokeless tobacco: Never Used  . Tobacco comment: smoked on and off 16 years- quit 2016  Substance and Sexual Activity  . Alcohol use: Yes    Comment: 08/08/17- No alcohol since 07/26/17  . Drug use: No    Frequency: 1.0 times per week    Types: "Crack" cocaine, Cocaine    Comment: 08/08/2017- no cocaine for 1 month  . Sexual activity: Yes    Birth control/protection: Condom    Comment: anal  Lifestyle  . Physical activity:    Days per week: Not on file    Minutes per session: Not on file  . Stress: Not on file  Relationships  . Social connections:    Talks on phone: Not  on file    Gets together: Not on file    Attends religious service: Not on file    Active member of club or organization: Not on file    Attends meetings of clubs or organizations: Not on file    Relationship status: Not on file  . Intimate partner violence:    Fear of current or ex partner: Not on file    Emotionally abused: Not on file     Physically abused: Not on file    Forced sexual activity: Not on file  Other Topics Concern  . Not on file  Social History Narrative   ** Merged History Encounter **       ** Merged History Encounter **        Review of Systems: Pertinent positive and negative review of systems were noted in the above HPI section.  All other review of systems was otherwise negative.  Physical Exam: Vital signs in last 24 hours: Temp:  [97.8 F (36.6 C)-98.7 F (37.1 C)] 98 F (36.7 C) (04/09 1256) Pulse Rate:  [74-96] 80 (04/09 1256) Resp:  [14-16] 16 (04/09 1256) BP: (103-115)/(68-84) 110/82 (04/09 1256) SpO2:  [98 %-100 %] 100 % (04/09 1256) Last BM Date: 10/25/17 General:   Alert,  Well-developed, well-nourished, African-American male pleasant and cooperative in NAD Head:  Normocephalic and atraumatic. Eyes:  Sclera clear, no icterus.   Conjunctiva pink. Ears:  Normal auditory acuity. Nose:  No deformity, discharge,  or lesions. Mouth:  No deformity or lesions.   Neck:  Supple; no masses or thyromegaly. Lungs:  Clear throughout to auscultation.   No wheezes, crackles, or rhonchi. Heart:  Regular rate and rhythm; no murmurs, clicks, rubs,  or gallops. Abdomen:  Soft,nontender, BS active,nonpalp mass or hsm.   Rectal:  Deferred  Msk:  Symmetrical without gross deformities. . Pulses:  Normal pulses noted. Extremities:  Without clubbing or edema. Neurologic:  Alert and  oriented x4;  grossly normal neurologically. Skin:  Intact without significant lesions or rashes.. Psych:  Alert and cooperative. Normal mood and affect.  Intake/Output from previous day: No intake/output data recorded. Intake/Output this shift: No intake/output data recorded.  Lab Results: Recent Labs    10/23/17 0742 10/24/17 0226 10/25/17 0613  WBC 3.3* 3.6* 3.8*  HGB 14.1 14.4 13.5  HCT 39.5 42.2 39.7  PLT 88* 88* 100*   BMET Recent Labs    10/23/17 0742 10/24/17 0226 10/25/17 0613  NA 134* 135 134*    K 4.3 3.6 3.7  CL 102 101 100*  CO2 '22 25 26  ' GLUCOSE 78 115* 89  BUN '9 9 11  ' CREATININE 0.86 0.80 0.89  CALCIUM 9.1 9.0 9.1   LFT Recent Labs    10/25/17 0613  PROT 6.4*  ALBUMIN 3.2*  AST 401*  ALT 395*  ALKPHOS 68  BILITOT 0.5   PT/INR No results for input(s): LABPROT, INR in the last 72 hours. 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 Patient had been drinking alcohol heavily for 2 weeks prior to the suicide attempt and had stopped all of his regular medications. Patient has had elevated LFTs since admission, he has had moderate increase in transaminases since admission CT and ultrasound both consistent with hepatic steatosis  Acute  transaminitis is consistent with drug-induced liver injury secondary to combination of heavy alcohol, Listerine and crack cocaine. He is mentating well, no evidence by the labs of underlying cirrhosis and no evidence of acute hepatic failure  #2 mild neutropenia and thrombocytopenia Likely secondary to combination of medication effects i.e. Depakote and polysubstance use/abuse #3 history of major depression, prior suicide attempts #4 history of hyperlipidemia  Plan; check pro time/INR Follow-up hepatic panel in a.m.  If INR is normal and transaminases are stable, he can be discharged home.  LFTs may take several weeks to normalize.  He will need follow-up LFTs within 1 week of discharge, and repeat CBC, and then follow LFTs to normalization.     [name] Esterwood  10/25/2017, 2:02 PM   Attending physician's note   I have taken an interval history, reviewed the chart and examined the patient. I agree with the Advanced Practitioner's note, impression and recommendations.    37 year old with alcoholic hepatitis with underlying alcohol induced fatty liver but no liver cirrhosis on ultrasound or CT.  Admitted with suicide attempt with alcohol, Listerine and crack cocaine.  Recommend : Complete abstinence from alcohol.  Recheck liver function tests in 6-8 weeks.  If they are still elevated, proceed with liver biopsy. (hepatic elastography may be beneficial but not covered by insurance).  Recommend changing Depakote due to hepatotoxicity to any other alternative medication for bipolar disorder.   Carmell Austria, MD

## 2017-10-25 NOTE — Care Management Note (Signed)
Case Management Note  Patient Details  Name: Kimani Hovis MRN: [id number] Date of Birth: [birth year]  Subjective/Objective:                    Action/Plan: Plan is for patient to d/c home when medically stable. CM following for d/c needs.  Expected Discharge Date:                  Expected Discharge Plan:  Home/Self Care  In-House Referral:     Discharge planning Services  CM Consult, [facility]  Post Acute Care Choice:    Choice offered to:     DME Arranged:    DME Agency:     HH Arranged:    HH Agency:     Status of Service:  In process, will continue to follow  If discussed at Long Length of Stay Meetings, dates discussed:    Additional Comments:  Pollie Friar, RN 10/25/2017, 11:19 AM

## 2017-10-25 NOTE — Progress Notes (Signed)
Family Medicine Teaching Service Daily Progress Note Intern Pager: [phone]  Patient name: [name] Medical record number: [id number] Date of birth: 01-31-1981 Age: 37 y.o. Gender: male  Primary Care Provider: Patient, No Pcp [name] Consultants: Psychiatry Code Status: Full  Pt Overview and Major Events to Date:  4/4 - admitted for N/V, alcohol withdrawal  Assessment and Plan: [name] is a 37 y.o. male presenting with nausea and vomiting after consuming large amounts of listerine. PMH is significant for polysubstance use disorder (alcohol, cocaine, tobacco), PVD, HLD, PTSD, MDD w/ h/o suicide attempts, h/o breast CA and kidney CA s/p treatment.  Elevated transaminases - Worsening, likely 2/2 alcohol use. AST 233>145>270>331>401, ALT 128>115>192>273>395. Ferritin elevated at 462. Hepatitis panel negative. GGT wnl. RUQ [place] consistent with hepatic steatosis, no evidence of cholelithasis. - consult GI due to rising LFTs - holding home atorvastatin and other hepatotoxic medications  Alcohol Withdrawal- weaned off scheduled Ativan 4/6 and CIWAs have been 0. Nausea/vomiting resolved. - Continue CIWA - Zofran PRN - Continue folate, thiamine, MVI  Depression s/p suicide attempt - follows at [facility]. Of note patient is transitioning M to F. Patient denies suicidal ideations this morning, mood stable - cleared by psychiatry, no evidence of imminent risk to self or others at present - continue home Depakote 250mg  BID, Geodon 20mg  daily - follow up with [facility] as outpatient, has appt scheduled for next week.  Leukopenia / Thrombocytopenia, new: Leukopenia and thrombocytopenia improving. May be due to alcohol use, although h/o breast and kidney cancer. Not on any meds that could be contributing. HIV negative this admission. WBC 2-3 this admission (BL 5-6) but slowly improving. Plt improved to 100 (BL 150-215). Breast exam performed this morning with slightly firm mobile 1cm nodule noted on L  side of chest wall this morning, consider lipoma vs scar tissue, not likely neoplastic. - will obtain peripheral smear with path review while inpatient  Generalized pruritis: Likely related to alcohol withdrawal. Remains persistent. Has benadryl cream PRN. Scheduled hydroxyzine yesterday with good relief. Did not receive any PRN benadryl overnight. - monitor closely, careful to avoid IV benadryl due to abuse potential and potential for excessive sedation - benadryl cream PRN   - hydroxyzine scheduled  HTN - Normotensive overnight. - continue Norvasc 5mg  daily - monitor BPs  Anxiety  - Continue home hydroxyzine 25mg  TID scheduled - monitor mood  Insomnia - home med melatonin 5-10 mg QHS, Trazodone 300 mg QHS - continue home trazodone  FEN/GI: heart healthy diet Prophylaxis: discontinued lovenox due to thrombocytopenia; start SCDs  Disposition: Will need to stay another day due to increased liver transaminases and continued work up. Possible discharge home tomorrow.  Subjective:  [name] itching is much improved this morning. Mood stable. Expresses desire to go home but appreciates LFT workup.   Objective: Temp:  [97.8 F (36.6 C)-98.7 F (37.1 C)] 97.8 F (36.6 C) (04/09 0731) Pulse Rate:  [74-96] 74 (04/09 0731) Resp:  [14-16] 16 (04/09 0731) BP: (103-117)/(68-84) 103/76 (04/09 0731) SpO2:  [98 %-100 %] 99 % (04/09 0731) Physical Exam:  General: laying in bed, in NAD Cardiovascular: RRR, no murmur Chest: CTAB, NWOB on room air. Breast exam with mobile 1cm nodule noted on L anterior chest wall, otherwise wnl. Abdomen: +BS, soft, non-tender, non-distended Extremities: no edema Skin: No rashes on exposed skin Psych: appropriate affect, no SI/HI  Laboratory: Recent Labs  Lab 10/23/17 0742 10/24/17 0226 10/25/17 0613  WBC 3.3* 3.6* 3.8*  HGB 14.1 14.4 13.5  HCT 39.5  42.2 39.7  PLT 88* 88* 100*   Recent Labs  Lab 10/23/17 0742 10/24/17 0226 10/25/17 0613   NA 134* 135 134*  K 4.3 3.6 3.7  CL 102 101 100*  CO2 22 25 26   BUN 9 9 11   CREATININE 0.86 0.80 0.89  CALCIUM 9.1 9.0 9.1  PROT 6.4* 6.8 6.4*  BILITOT 0.7 0.7 0.5  ALKPHOS 73 74 68  ALT 192* 273* 395*  AST 270* 331* 401*  GLUCOSE 78 115* 89   Imaging/Diagnostic Tests: US Abdomen Limited Ruq  Result Date: 10/24/2017 CLINICAL DATA:  Abdominal pain for several days. EXAM: ULTRASOUND ABDOMEN LIMITED RIGHT UPPER QUADRANT COMPARISON:  None. FINDINGS: Gallbladder: No gallstones or wall thickening visualized. No sonographic Murphy sign noted by sonographer. Common bile duct: Diameter: 3 mm, within normal limits. Liver: Diffusely increased echogenicity of the hepatic parenchyma, consistent with hepatic steatosis. No focal mass lesion identified. Portal vein is patent on color Doppler imaging with normal direction of blood flow towards the liver. IMPRESSION: No evidence of cholelithiasis or biliary ductal dilatation. Diffuse hepatic steatosis. Electronically Signed   By: Earle Gell M.D.   On: 10/24/2017 17:19    Rory Percy, DO 10/25/2017, 9:07 AM PGY-1, [place] Intern pager: [phone], text pages welcome

## 2017-10-26 LAB — CBC
HCT: 39 % (ref 39.0–52.0)
Hemoglobin: 13.2 g/dL (ref 13.0–17.0)
MCH: 29.7 pg (ref 26.0–34.0)
MCHC: 33.8 g/dL (ref 30.0–36.0)
MCV: 87.6 fL (ref 78.0–100.0)
Platelets: 133 10*3/uL — ABNORMAL LOW (ref 150–400)
RBC: 4.45 MIL/uL (ref 4.22–5.81)
RDW: 13.5 % (ref 11.5–15.5)
WBC: 4.2 10*3/uL (ref 4.0–10.5)

## 2017-10-26 LAB — COMPREHENSIVE METABOLIC PANEL
ALBUMIN: 3.1 g/dL — AB (ref 3.5–5.0)
ALK PHOS: 70 U/L (ref 38–126)
ALT: 381 U/L — ABNORMAL HIGH (ref 17–63)
AST: 283 U/L — AB (ref 15–41)
Anion gap: 9 (ref 5–15)
BILIRUBIN TOTAL: 0.1 mg/dL — AB (ref 0.3–1.2)
BUN: 10 mg/dL (ref 6–20)
CALCIUM: 8.9 mg/dL (ref 8.9–10.3)
CO2: 25 mmol/L (ref 22–32)
Chloride: 101 mmol/L (ref 101–111)
Creatinine, Ser: 0.84 mg/dL (ref 0.61–1.24)
GFR calc Af Amer: >60 mL/min (ref >60)
GLUCOSE: 108 mg/dL — AB (ref 65–99)
Potassium: 3.7 mmol/L (ref 3.5–5.1)
Sodium: 135 mmol/L (ref 135–145)
TOTAL PROTEIN: 6.1 g/dL — AB (ref 6.5–8.1)

## 2017-10-26 LAB — PROTIME-INR
INR: 1
Prothrombin Time: 13.1 seconds (ref 11.4–15.2)

## 2017-10-26 LAB — PATHOLOGIST SMEAR REVIEW

## 2017-10-26 MED ORDER — LORAZEPAM 1 MG PO TABS: 1.0000 mg | ORAL_TABLET | Freq: Four times a day (QID) | ORAL | Status: DC | PRN

## 2017-10-26 MED ORDER — ZIPRASIDONE HCL 20 MG PO CAPS
20.0000 mg | ORAL_CAPSULE | Freq: Two times a day (BID) | ORAL | Status: DC
  Administered 2017-10-26: 20 mg via ORAL
  Filled 2017-10-26 (×2): qty 1

## 2017-10-26 NOTE — Progress Notes (Signed)
Discharge instruction reviewed with patient. All questions answered at this time. Transport home by family.   Enrrique Mierzwa, RN 

## 2017-10-26 NOTE — Care Management Note (Signed)
Case Management Note  Patient Details  Name: [name] MRN: [id number] Date of Birth: 21-Jul-1980  Subjective/Objective:                    Action/Plan: Pt discharging home with his mother providing transportation home.  Pt has been set up the [facility] for hospital f/u and PCP. Pt encouraged to use [facility] for his medication needs other than his psyche meds he receives through [facility].  CM provided him a note for work for the days he was admitted to the hospital.   Expected Discharge Date:  10/26/17               Expected Discharge Plan:  Home/Self Care  In-House Referral:     Discharge planning Services  CM Consult, [facility]  Post Acute Care Choice:    Choice offered to:     DME Arranged:    DME Agency:     HH Arranged:    [place] Agency:     Status of Service:  Completed, signed off  If discussed at [employer], dates discussed:    Additional Comments:  Pollie Friar, RN 10/26/2017, 12:27 PM

## 2017-10-26 NOTE — Discharge Instructions (Signed)
You were admitted for acute alcohol withdrawal and monitored for detoxification. Your mood remained stable throughout admission. You should follow up with [facility] outpatient for mood medication regimen. Your liver function lab values were elevated much of this admission but did start to come down. You will need follow up with a primary doctor for monitoring of these labs until they normalize. It is imperative to abstain from alcohol and other illicit drugs to avoid an additional hospitalization. I wish you the best of luck! Thank you for allowing [place] to take part in your care.

## 2017-10-26 NOTE — Progress Notes (Addendum)
Family Medicine Teaching Service Daily Progress Note Intern Pager: [phone]  Patient name: [name] Medical record number: [id number] Date of birth: 02/11/1981 Age: 37 y.o. Gender: male  Primary Care Provider: Patient, No Pcp Per Consultants: Psychiatry Code Status: Full  Pt Overview and Major Events to Date:  4/4 - admitted for N/V, alcohol withdrawal  Assessment and Plan: [name] is a 37 y.o. male presenting with nausea and vomiting after consuming large amounts of listerine. PMH is significant for polysubstance use disorder (alcohol, cocaine, tobacco), PVD, HLD, PTSD, MDD w/ h/o suicide attempts, h/o breast CA and kidney CA s/p treatment.  Elevated transaminases - Improving, likely 2/2 alcohol use. AST 233>>401>283, ALT 128>>395>381. PT/INR wnl this am. GI consulted yesterday who advised acute transaminitis is consistent with liver injury due to alcohol, chronic depakote use and ?crack cocaine. Depakote discontinued. Recommended f/u LFTs within a week of discharge and following until normalization. With improvement in LFTs, anticipate stable for discharge later today. - GI consulted, appreciate recs - holding home atorvastatin and other hepatotoxic medications  Alcohol Withdrawal- weaned off scheduled Ativan 4/6 and CIWAs have been 0 since 4/6. Nausea/vomiting resolved. - continue CIWA - Zofran PRN - Continue folate, thiamine, MVI  Depression s/p suicide attempt - follows at [facility]. Of note patient is transitioning M to F. Mood continues to be stable, denies depressive symptoms today, no SI/HI. - cleared by psychiatry, no evidence of imminent risk to self or others at present - Continue Geodon 20mg  daily - Depakote d/ced due to decrease in plts/WBC and worsened LFTs - follow up with [facility] as outpatient, has appt scheduled for next week.  Leukopenia / Thrombocytopenia, new: Leukopenia and thrombocytopenia improving.  WBC  3.9>>2.1>>4.2 this am. Plt 119>>55>>133 this am.  Most likely due to acute liver injury however Geodon and Depakote discontinued yesterday as thought may be contributing. Restarted Geodon for mood stabilization, can consider starting Lamotrigine for further mood stabilization although will likely defer to outpatient psychiatrist as patient's mood has been stable. Will need continued monitoring of WBC and plts outpatient. Peripheral smear obtained with no abnormal morphology per Pathology. - monitor outpatient  Generalized pruritis, improved: Likely related to alcohol withdrawal. Has benadryl cream PRN. Scheduled hydroxyzine with good relief. Did not receive any PRN benadryl overnight. - monitor closely, careful to avoid IV benadryl due to abuse potential and potential for excessive sedation - benadryl cream PRN   - hydroxyzine scheduled  HTN - Normotensive overnight. - continue Norvasc 5mg  daily - monitor BPs  Anxiety  - Continue home hydroxyzine 25mg  TID scheduled - monitor mood  Insomnia - home med melatonin 5-10 mg QHS, Trazodone 300 mg QHS - continue home trazodone  FEN/GI: heart healthy diet Prophylaxis: SCDs  Disposition: likely home today  Subjective:  Patient feeling well today, itching continues to be improved on scheduled hydroxyzine. Looking forward to going home today.   Objective: Temp:  [97.5 F (36.4 C)-98.2 F (36.8 C)] 97.5 F (36.4 C) (04/10 0419) Pulse Rate:  [80-88] 82 (04/10 0419) Resp:  [16-18] 18 (04/10 0419) BP: (101-120)/(74-90) 104/74 (04/10 0419) SpO2:  [98 %-100 %] 99 % (04/10 0419) Physical Exam:  General: laying in bed, in NAD Cardiovascular: RRR, no murmur Respiratory: CTAB, NWOB on room air.  Abdomen: +BS, soft, non-tender, non-distended Extremities: no edema Skin: No rashes on exposed skin Psych: appropriate affect, no SI/HI  Laboratory: Recent Labs  Lab 10/24/17 0226 10/25/17 0613 10/26/17 0511  WBC 3.6* 3.8* 4.2  HGB 14.4 13.5 13.2  HCT  42.2 39.7 39.0  PLT 88* 100* 133*    Recent Labs  Lab 10/24/17 0226 10/25/17 0613 10/26/17 0511  NA 135 134* 135  K 3.6 3.7 3.7  CL 101 100* 101  CO2 25 26 25   BUN 9 11 10   CREATININE 0.80 0.89 0.84  CALCIUM 9.0 9.1 8.9  PROT 6.8 6.4* 6.1*  BILITOT 0.7 0.5 0.1*  ALKPHOS 74 68 70  ALT 273* 395* 381*  AST 331* 401* 283*  GLUCOSE 115* 89 108*   Imaging/Diagnostic Tests: US Abdomen Limited Ruq  Result Date: 10/24/2017 CLINICAL DATA:  Abdominal pain for several days. EXAM: ULTRASOUND ABDOMEN LIMITED RIGHT UPPER QUADRANT COMPARISON:  None. FINDINGS: Gallbladder: No gallstones or wall thickening visualized. No sonographic Murphy sign noted by sonographer. Common bile duct: Diameter: 3 mm, within normal limits. Liver: Diffusely increased echogenicity of the hepatic parenchyma, consistent with hepatic steatosis. No focal mass lesion identified. Portal vein is patent on color Doppler imaging with normal direction of blood flow towards the liver. IMPRESSION: No evidence of cholelithiasis or biliary ductal dilatation. Diffuse hepatic steatosis. Electronically Signed   By: Earle Gell M.D.   On: 10/24/2017 17:19    Rory Percy, DO 10/26/2017, 8:51 AM PGY-1, [place] Intern pager: [phone], text pages welcome

## 2017-11-01 ENCOUNTER — Ambulatory Visit (INDEPENDENT_AMBULATORY_CARE_PROVIDER_SITE_OTHER): Payer: Self-pay | Admitting: Orthopaedic Surgery

## 2017-11-02 ENCOUNTER — Encounter (HOSPITAL_COMMUNITY): Payer: Self-pay | Admitting: Emergency Medicine

## 2017-11-02 ENCOUNTER — Emergency Department (HOSPITAL_COMMUNITY)
Admission: EM | Admit: 2017-11-02 | Discharge: 2017-11-02 | Disposition: A | Discharge status: Home / Self Care | Payer: Self-pay | Attending: Emergency Medicine | Admitting: Emergency Medicine

## 2017-11-02 ENCOUNTER — Emergency Department (HOSPITAL_COMMUNITY)
Admission: EM | Admit: 2017-11-02 | Discharge: 2017-11-03 | Disposition: A | Discharge status: Home / Self Care | Payer: Self-pay | Attending: Emergency Medicine | Admitting: Emergency Medicine

## 2017-11-02 ENCOUNTER — Encounter (HOSPITAL_COMMUNITY): Payer: Self-pay | Admitting: *Deleted

## 2017-11-02 ENCOUNTER — Other Ambulatory Visit: Payer: Self-pay

## 2017-11-02 DIAGNOSIS — Z5321 Procedure and treatment not carried out due to patient leaving prior to being seen by health care provider: Secondary | ICD-10-CM | POA: Insufficient documentation

## 2017-11-02 DIAGNOSIS — T6592XA Toxic effect of unspecified substance, intentional self-harm, initial encounter: Secondary | ICD-10-CM | POA: Insufficient documentation

## 2017-11-02 DIAGNOSIS — Z008 Encounter for other general examination: Secondary | ICD-10-CM | POA: Insufficient documentation

## 2017-11-02 DIAGNOSIS — Y908 Blood alcohol level of 240 mg/100 ml or more: Secondary | ICD-10-CM | POA: Insufficient documentation

## 2017-11-02 DIAGNOSIS — I2510 Atherosclerotic heart disease of native coronary artery without angina pectoris: Secondary | ICD-10-CM | POA: Insufficient documentation

## 2017-11-02 DIAGNOSIS — I10 Essential (primary) hypertension: Secondary | ICD-10-CM | POA: Insufficient documentation

## 2017-11-02 DIAGNOSIS — Z853 Personal history of malignant neoplasm of breast: Secondary | ICD-10-CM | POA: Insufficient documentation

## 2017-11-02 DIAGNOSIS — R45851 Suicidal ideations: Secondary | ICD-10-CM | POA: Insufficient documentation

## 2017-11-02 DIAGNOSIS — Z79899 Other long term (current) drug therapy: Secondary | ICD-10-CM | POA: Insufficient documentation

## 2017-11-02 DIAGNOSIS — Z87891 Personal history of nicotine dependence: Secondary | ICD-10-CM | POA: Insufficient documentation

## 2017-11-02 DIAGNOSIS — Z85528 Personal history of other malignant neoplasm of kidney: Secondary | ICD-10-CM | POA: Insufficient documentation

## 2017-11-02 DIAGNOSIS — F332 Major depressive disorder, recurrent severe without psychotic features: Secondary | ICD-10-CM | POA: Insufficient documentation

## 2017-11-02 DIAGNOSIS — F1910 Other psychoactive substance abuse, uncomplicated: Secondary | ICD-10-CM

## 2017-11-02 DIAGNOSIS — F1020 Alcohol dependence, uncomplicated: Secondary | ICD-10-CM | POA: Insufficient documentation

## 2017-11-02 DIAGNOSIS — F1010 Alcohol abuse, uncomplicated: Secondary | ICD-10-CM

## 2017-11-02 DIAGNOSIS — F1024 Alcohol dependence with alcohol-induced mood disorder: Secondary | ICD-10-CM | POA: Diagnosis present

## 2017-11-02 LAB — ETHANOL: Alcohol, Ethyl (B): 325 mg/dL (ref <10)

## 2017-11-02 LAB — COMPREHENSIVE METABOLIC PANEL
ALT: 196 U/L — ABNORMAL HIGH (ref 17–63)
ANION GAP: 16 — AB (ref 5–15)
AST: 109 U/L — AB (ref 15–41)
Albumin: 4.1 g/dL (ref 3.5–5.0)
Alkaline Phosphatase: 97 U/L (ref 38–126)
BUN: 11 mg/dL (ref 6–20)
CO2: 24 mmol/L (ref 22–32)
Calcium: 8.9 mg/dL (ref 8.9–10.3)
Chloride: 102 mmol/L (ref 101–111)
Creatinine, Ser: 0.81 mg/dL (ref 0.61–1.24)
Glucose, Bld: 68 mg/dL (ref 65–99)
POTASSIUM: 4.1 mmol/L (ref 3.5–5.1)
Sodium: 142 mmol/L (ref 135–145)
Total Bilirubin: 0.5 mg/dL (ref 0.3–1.2)
Total Protein: 8.6 g/dL — ABNORMAL HIGH (ref 6.5–8.1)

## 2017-11-02 LAB — URINALYSIS, MICROSCOPIC (REFLEX)
BACTERIA UA: NONE SEEN
RBC / HPF: NONE SEEN RBC/hpf (ref 0–5)
WBC UA: NONE SEEN WBC/hpf (ref 0–5)

## 2017-11-02 LAB — URINALYSIS, ROUTINE W REFLEX MICROSCOPIC
BILIRUBIN URINE: NEGATIVE
Glucose, UA: NEGATIVE mg/dL
Leukocytes, UA: NEGATIVE
NITRITE: NEGATIVE
PH: 6 (ref 5.0–8.0)
Specific Gravity, Urine: >1.03 — ABNORMAL HIGH (ref 1.005–1.030)

## 2017-11-02 LAB — RAPID URINE DRUG SCREEN, HOSP PERFORMED
Amphetamines: NOT DETECTED
Barbiturates: NOT DETECTED
Benzodiazepines: NOT DETECTED
Cocaine: POSITIVE — AB
Opiates: NOT DETECTED
Tetrahydrocannabinol: NOT DETECTED

## 2017-11-02 LAB — CBC
HEMATOCRIT: 44.7 % (ref 39.0–52.0)
Hemoglobin: 15.8 g/dL (ref 13.0–17.0)
MCH: 30.3 pg (ref 26.0–34.0)
MCHC: 35.3 g/dL (ref 30.0–36.0)
MCV: 85.6 fL (ref 78.0–100.0)
Platelets: 324 10*3/uL (ref 150–400)
RBC: 5.22 MIL/uL (ref 4.22–5.81)
RDW: 14.4 % (ref 11.5–15.5)
WBC: 5.1 10*3/uL (ref 4.0–10.5)

## 2017-11-02 LAB — ACETAMINOPHEN LEVEL: Acetaminophen (Tylenol), Serum: <10 ug/mL — ABNORMAL LOW (ref 10–30)

## 2017-11-02 LAB — LIPASE, BLOOD: Lipase: 26 U/L (ref 11–51)

## 2017-11-02 LAB — SALICYLATE LEVEL: Salicylate Lvl: <7 mg/dL (ref 2.8–30.0)

## 2017-11-02 MED ORDER — KETOROLAC TROMETHAMINE 30 MG/ML IJ SOLN
30.0000 mg | Freq: Once | INTRAMUSCULAR | Status: AC
  Administered 2017-11-02: 30 mg via INTRAVENOUS
  Filled 2017-11-02 (×2): qty 1

## 2017-11-02 MED ORDER — ONDANSETRON HCL 4 MG/2ML IJ SOLN
4.0000 mg | Freq: Once | INTRAMUSCULAR | Status: AC
  Administered 2017-11-02: 4 mg via INTRAVENOUS
  Filled 2017-11-02: qty 2

## 2017-11-02 MED ORDER — SODIUM CHLORIDE 0.9 % IV BOLUS
1000.0000 mL | Freq: Once | INTRAVENOUS | Status: AC
  Administered 2017-11-02: 1000 mL via INTRAVENOUS

## 2017-11-02 MED ORDER — LORAZEPAM 2 MG/ML IJ SOLN
1.0000 mg | Freq: Once | INTRAMUSCULAR | Status: AC
  Administered 2017-11-02: 1 mg via INTRAVENOUS
  Filled 2017-11-02: qty 1

## 2017-11-02 NOTE — ED Notes (Signed)
Pt expressing that he wanted to leave and not be seen. Took scrubs into room for him to change into.

## 2017-11-02 NOTE — ED Triage Notes (Signed)
Pt stated "I don't want to be here anymore.  My dad can't accept me.  I drank 3 bottles of Listerine today.  I've gone to Listerine now."

## 2017-11-02 NOTE — ED Provider Notes (Signed)
[facility] DEPT Provider Note   CSN: [id number] Arrival date & time: 11/02/17  1837     History   Chief Complaint Chief Complaint  Patient presents with  . Ingestion  . Suicidal    HPI [name] [facility]) is a 37 y.o. male with a past medical history of angina, anxiety, bipolar 1 disorder, breast cancer (diagnosed in 2009), CAD, depression, depression, prior suicide attempts, headache, hypercholesterolemia, hypertension, liver cirrhosis, pancreatitis, PVD, schizophrenia, seizures who presents the emergency department today for congestion of blistering as well as suicidal ideation.  Patient states that he has been going through a lot over the last several days.  He notes that he found out his boyfriend has been having an affair and has a child.  He reports that his father has kicked him out of his house and he is now living in apartment with a roommate.  He reports that his roommate has got him into bed behaviors including smoking crack this morning around 9 AM.  He reports that he is felt more depressed recently and says "I do not want to be here anymore".  The patient states that around 5 PM today he drank 3 large bottles of Listerine and act to harm himself as well as get high.  He notes that since that time he has been having upper abdominal pain along with nausea and has had 3 episodes of nonbilious, nonbloody emesis.  Patient reports he has not drink any other alcohol (including rubbing alcohol or other types) or use any drugs other than reported above.  He has not been taking his home medications.  He denies any homicidal ideation.  No fever, chills, chest pain, shortness of breath or other symptoms at this time.  Patient is concerned for STDs given his boyfriend had an affair.  He is asymptomatic at this time.  No urinary symptoms, penile discharge, or painful bowel movements.  Patient denies any visual or auditory hallucinations.  HPI  Past Medical  History:  Diagnosis Date  . Angina   . Anxiety    panic attack  . Bipolar 1 disorder ([place])   . Breast CA ([place]) dx'd 2009   bil w/ bil masectomy and oral meds  . Cancer [facility])    kidney cancer  . Coronary artery disease   . Depression   . H/O suicide attempt 2015   overdose  . Headache(784.0)   . Hypercholesteremia   . Hypertension   . Liver cirrhosis ([place])   . Pancreatitis   . Peripheral vascular disease [facility]) April 2011   Left Pop  . Schizophrenia ([place])   . Seizures ([place])    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  . Shortness of breath     Patient Active Problem List   Diagnosis Date Noted  . Emesis 10/20/2017  . Displaced fracture of lateral end of left clavicle, initial encounter for closed fracture 08/05/2017  . Coracoclavicular (ligament) sprain and strain, left, initial encounter 08/05/2017  . Closed dislocation of acromioclavicular joint, initial encounter 08/05/2017  . Insomnia   . Hypertension   . Severe episode of recurrent major depressive disorder, without psychotic features ([place])   . Depression, major, severe recurrence ([place]) 12/30/2015  . Substance induced mood disorder ([place]) 12/02/2015  . Mood disorder in conditions classified elsewhere   . MDD (major depressive disorder), recurrent severe, without psychosis ([place]) 10/08/2015  . Malnutrition of moderate degree 09/24/2015  . Alcohol withdrawal ([place]) 09/23/2015  . Tobacco use disorder 07/16/2015  .  Elevated transaminase level   . Drug overdose, intentional ([place]) 07/12/2015  . Alcohol use disorder, severe, dependence ([place]) 04/15/2015  . Cocaine abuse with cocaine-induced mood disorder ([place]) 04/11/2015  . Overdose 04/10/2015  . Severe recurrent major depressive disorder with psychotic features ([place])   . Severe major depression with psychotic features ([place]) 09/11/2014  . Alcohol-induced mood disorder ([place]) 09/10/2014  . Suicidal ideation   . Tylenol overdose   . Polysubstance abuse ([place])   . Overdose of  acetaminophen 08/03/2014  . Overdose by acetaminophen 08/03/2014  . Cocaine abuse ([place]) 04/16/2014  . Thrombocytopenia ([place]) 04/15/2014  . Urinary tract infection, site not specified 04/15/2014  . Chest pain, unspecified 04/15/2014  . D-dimer, elevated 04/15/2014  . Transaminitis 09/24/2013  . Cocaine dependence ([place]) 09/20/2013  . S/p nephrectomy 04/28/2013  . Seizure ([place]) 03/15/2013  . Syncope 01/02/2013  . Leukocytopenia, unspecified 01/02/2013  . Left kidney mass 12/24/2012  . PTSD (post-traumatic stress disorder) 07/06/2012  . Episode of recurrent major depressive disorder ([place]) 06/29/2012    Class: Acute  . Peripheral vascular disease ([place]) 01/14/2012  . Alcohol abuse 10/13/2011  . SEIZURE DISORDER 10/03/2008  . Elevated LFTs 12/29/2007  . HYPERCHOLESTEROLEMIA 03/21/2007  . Essential hypertension 03/21/2007    Past Surgical History:  Procedure Laterality Date  . BREAST SURGERY    . BREAST SURGERY     bilateral breast silocone  removal  . CHEST SURGERY    . left kidney removal    . left leg surgery     "popiteal artery clogged"  . MASTECTOMY Bilateral   . NEPHRECTOMY Left   . ORIF CLAVICULAR FRACTURE Left 08/10/2017   Procedure: OPEN REDUCTION INTERNAL FIXATION (ORIF) LEFT CLAVICLE FRACTURE WITH RECONSTRUCTION OF CORACOCLAVICULAR LIGAMENT;  Surgeon: Leandrew Koyanagi, MD;  Location: [facility];  Service: Orthopedics;  Laterality: Left;  . RECONSTRUCTION OF CORACOCLAVICULAR LIGAMENT Left 08/10/2017   Procedure: RECONSTRUCTION OF CORACOCLAVICULAR LIGAMENT;  Surgeon: Leandrew Koyanagi, MD;  Location: [facility];  Service: Orthopedics;  Laterality: Left;        Home Medications    Prior to Admission medications   Medication Sig Start Date End Date Taking? Authorizing Provider  amLODipine (NORVASC) 5 MG tablet Take 1 tablet (5 mg total) by mouth daily. For high blood pressure Patient taking differently: Take 5 mg by mouth 2 (two) times daily. For high blood pressure 08/04/17  Yes Money,  Lowry Ram, FNP  atorvastatin (LIPITOR) 20 MG tablet Take 1 tablet (20 mg total) by mouth daily at 6 PM. For high cholesterol 08/03/17  Yes Money, Lowry Ram, FNP  LORazepam (ATIVAN) 2 MG tablet Take 2 mg by mouth 2 (two) times daily.   Yes [provider]  Melatonin 5 MG TABS Take 5-10 mg by mouth at bedtime.   Yes [provider]  traZODone (DESYREL) 300 MG tablet Take 1 tablet (300 mg total) by mouth at bedtime. For sleep and mood 08/03/17  Yes Money, Lowry Ram, FNP  ziprasidone (GEODON) 20 MG capsule Take 1 capsule (20 mg total) by mouth 2 (two) times daily with a meal. For mood control 08/03/17  Yes Money, [name] B, FNP  hydrOXYzine (ATARAX/VISTARIL) 25 MG tablet Take 1 tablet (25 mg total) by mouth 3 (three) times daily as needed for anxiety. 08/03/17   Money, Lowry Ram, FNP  ibuprofen (ADVIL,MOTRIN) 200 MG tablet Take 400 mg by mouth every 6 (six) hours as needed for headache or moderate pain.    [provider]  methocarbamol (ROBAXIN)  750 MG tablet Take 1 tablet (750 mg total) by mouth 2 (two) times daily as needed for muscle spasms. Patient not taking: Reported on 08/30/2017 08/10/17   Leandrew Koyanagi, MD  ondansetron (ZOFRAN) 4 MG tablet Take 1-2 tablets (4-8 mg total) by mouth every 8 (eight) hours as needed for nausea or vomiting. Patient not taking: Reported on 10/19/2017 08/10/17   Leandrew Koyanagi, MD  senna-docusate (SENOKOT S) 8.6-50 MG tablet Take 1 tablet by mouth at bedtime as needed. Patient not taking: Reported on 10/19/2017 08/10/17   Leandrew Koyanagi, MD    Family History Family History  Problem Relation Age of Onset  . Stroke Other   . Cancer Other   . Hyperlipidemia Mother   . Hypertension Mother     Social History Social History   Tobacco Use  . Smoking status: Former Research scientist (life sciences)  . Smokeless tobacco: Never Used  . Tobacco comment: smoked on and off 16 years- quit 2016  Substance Use Topics  . Alcohol use: Yes    Comment: 08/08/17- No alcohol since 07/26/17; Pt  stated as of 11/02/17 "now drinking Listerine"  . Drug use: No    Frequency: 1.0 times per week    Types: "Crack" cocaine, Cocaine    Comment: 08/08/2017- no cocaine for 1 month     Allergies   Codeine; Penicillins; Morphine; Coconut flavor; Coconut oil; Grapefruit concentrate; Morphine and related; Oxycodone; and Norco [hydrocodone-acetaminophen]   Review of Systems Review of Systems  All other systems reviewed and are negative.    Physical Exam Updated Vital Signs BP 130/90 (BP Location: Left Arm)   Pulse (!) 110   Temp 98.7 F (37.1 C) (Oral)   Resp 18   Ht 5\' 6"  (1.676 m)   Wt 66.2 kg (146 lb)   SpO2 97%   BMI 23.57 kg/m   Physical Exam  Constitutional: He appears well-developed and well-nourished.  HENT:  Head: Normocephalic and atraumatic.  Right Ear: External ear normal.  Left Ear: External ear normal.  Nose: Nose normal.  Mouth/Throat: Uvula is midline, oropharynx is clear and moist and mucous membranes are normal. No tonsillar exudate.  Eyes: Pupils are equal, round, and reactive to light. Right eye exhibits no discharge. Left eye exhibits no discharge. No scleral icterus.  Neck: Trachea normal. Neck supple. No spinous process tenderness present. No neck rigidity. Normal range of motion present.  Cardiovascular: Regular rhythm and intact distal pulses. Tachycardia present.  No murmur heard. Pulses:      Radial pulses are 2+ on the right side, and 2+ on the left side.       Dorsalis pedis pulses are 2+ on the right side, and 2+ on the left side.       Posterior tibial pulses are 2+ on the right side, and 2+ on the left side.  No lower extremity swelling or edema. Calves symmetric in size bilaterally.  Pulmonary/Chest: Effort normal and breath sounds normal. He exhibits no tenderness.  Abdominal: Soft. Bowel sounds are normal. He exhibits no distension. There is tenderness in the epigastric area. There is no rigidity, no rebound, no guarding, no CVA tenderness,  no tenderness at McBurney's point and negative Murphy's sign.  Musculoskeletal: He exhibits no edema.  Lymphadenopathy:    He has no cervical adenopathy.  Neurological: He is alert.  Skin: Skin is warm and dry. No rash noted. He is not diaphoretic.  Psychiatric: He has a normal mood and affect.  Nursing note and vitals reviewed.  ED Treatments / Results  Labs (all labs ordered are listed, but only abnormal results are displayed) Labs Reviewed  ETHANOL - Abnormal; Notable for the following components:      Result Value   Alcohol, Ethyl (B) 325 (*)    All other components within normal limits  ACETAMINOPHEN LEVEL - Abnormal; Notable for the following components:   Acetaminophen (Tylenol), Serum <10 (*)    All other components within normal limits  RAPID URINE DRUG SCREEN, HOSP PERFORMED - Abnormal; Notable for the following components:   Cocaine POSITIVE (*)    All other components within normal limits  URINALYSIS, ROUTINE W REFLEX MICROSCOPIC - Abnormal; Notable for the following components:   APPearance TURBID (*)    Specific Gravity, Urine >1.030 (*)    Hgb urine dipstick TRACE (*)    Ketones, ur TRACE (*)    Protein, ur TRACE (*)    All other components within normal limits  COMPREHENSIVE METABOLIC PANEL - Abnormal; Notable for the following components:   Total Protein 8.6 (*)    AST 109 (*)    ALT 196 (*)    Anion gap 16 (*)    All other components within normal limits  URINALYSIS, MICROSCOPIC (REFLEX) - Abnormal; Notable for the following components:   Squamous Epithelial / LPF 0-5 (*)    All other components within normal limits  LIPASE, BLOOD  SALICYLATE LEVEL  CBC  RPR  HIV ANTIBODY (ROUTINE TESTING)  OSMOLALITY  GC/CHLAMYDIA PROBE AMP ([place]) NOT AT [facility]    EKG EKG Interpretation  Date/Time:  Wednesday November 02 2017 21:35:26 EDT Ventricular Rate:  91 PR Interval:  136 QRS Duration: 86 QT Interval:  402 QTC Calculation: 494 R Axis:   75 Text  Interpretation:  Normal sinus rhythm Prolonged QT Abnormal ECG Since last tracing QT has shortened Confirmed by Virgel Manifold [phone]) on 11/02/2017 11:28:30 PM   Radiology No results found.  Procedures Procedures (including critical care time)  Medications Ordered in ED Medications  thiamine (VITAMIN B-1) tablet 100 mg (has no administration in time range)    Or  thiamine (B-1) injection 100 mg (has no administration in time range)  multivitamin with minerals tablet 1 tablet (has no administration in time range)  ondansetron (ZOFRAN) tablet 4 mg (has no administration in time range)  sodium chloride 0.9 % bolus 1,000 mL (1,000 mLs Intravenous New Bag/Given 11/02/17 2207)  ondansetron (ZOFRAN) injection 4 mg (4 mg Intravenous Given 11/02/17 2210)  LORazepam (ATIVAN) injection 1 mg (1 mg Intravenous Given 11/02/17 2247)  ketorolac (TORADOL) 30 MG/ML injection 30 mg (30 mg Intravenous Given 11/02/17 2348)     Initial Impression / Assessment and Plan / ED Course  I have reviewed the triage vital signs and the nursing notes.  Pertinent labs & imaging results that were available during my care of the patient were reviewed by me and considered in my medical decision making (see chart for details).      37 y.o. male with a past medical history of angina, anxiety, bipolar 1 disorder, breast cancer (diagnosed in 2009 s/p resection), CAD, depression, depression, prior suicide attempts, headache, hypercholesterolemia, hypertension, liver cirrhosis, pancreatitis, PVD, schizophrenia, seizures who presents the emergency department today for congestion of blistering as well as suicidal ideation.  Patient states that he has been going through a lot over the last several days.  He notes that he found out his boyfriend has been having an affair and has a child.  He reports that  his father has kicked him out of his house and he is now living in apartment with a roommate.  He reports that his roommate has got  him into bed behaviors including smoking crack this morning around 9 AM.  He reports that he is felt more depressed recently and says "I do not want to be here anymore".  The patient states that around 5 PM today he drank 3 large bottles of Listerine and act to harm himself as well as get high.  He notes that since that time he has been having upper abdominal pain along with nausea and has had 3 episodes of nonbilious, nonbloody emesis.  Patient reports he has not drank any other alcohol (including rubbing alcohol or other types) or use any drugs other than reported above.  He has not been taking his home medications.  He denies any homicidal ideation.  No fever, chills, chest pain, shortness of breath or other symptoms at this time.  Patient is concerned for STDs given his boyfriend had an affair.  He is asymptomatic at this time.  No urinary symptoms, penile discharge, or painful bowel movements.  Patient denies any visual or auditory hallucinations.  MDM   Patient vital signs significant for tachycardia at 107 and hypertension at 125/99.  Vitals are otherwise reassuring.  Patient without any focal neurologic deficits.  He does have some upper abdominal pain.  No right upper quadrant tenderness palpation and no Murphy sign it may be concern for gallbladder pathology.  I spoke with Patty of poison control who states that the patient is long enough amount of observation period.  Recommended that patient receive IV fluids and nausea medication.  If heart rate normalizes and is tolerating p.o. intake he can be medically cleared.  EKG NSR with improving QT interval. [name] alcohol level 325.  Surprisingly the patient appears clinically sober is not slurring his words and is in the normal gait.  Suspect the patient lives at this level frequently.  Labs are otherwise reassuring and at baseline.  [name] serum osmolality 370.  [name] calculated mOsm/kg Osm Gap -9.5.  This falls within the normal serum  osmolality gap.  I did obtain STD screening given patient concern for STDs as stated previously.  He would like to hold off on treatment at this time.  Tachycardia resolved after IV fluids. Patient normotensive. He is tolerating p.o. oral intake without any emesis in the department.  Patient abdominal exam without any peritoneal signs.    Will hold on reordering home medication given patient has not taken these medications of the last 3 weeks.  Patient previously received medications during last stay and had episode of hypotension.   CIWA order set placed. Ordered thiamine, folic acid and multivitamin for the patient daily.  Consult TTS placed.  Patient is medically cleared.  Vitals:   11/02/17 1944 11/02/17 1950 11/02/17 2259  BP: 130/90  129/86  Pulse: (!) 110  95  Resp: 18  10  Temp: 98.7 F (37.1 C)  98.4 F (36.9 C)  TempSrc: Oral  Oral  SpO2: 97%  93%  Weight:  66.2 kg (146 lb)   Height:  5\' 6"  (1.676 m)     Final Clinical Impressions(s) / ED Diagnoses   Final diagnoses:  Suicidal ideations  Encounter for medical clearance for patient hold  Alcohol abuse  Polysubstance abuse (HCC)  Ingestion of substance, intentional self-harm, initial encounter [facility])    ED Discharge Orders    None  Lorelle Gibbs 11/03/17 Alyson Ingles    Virgel Manifold, MD 11/03/17 [phone]

## 2017-11-02 NOTE — ED Notes (Signed)
Pt and family left, encouraged to stay and seek treatment.

## 2017-11-02 NOTE — ED Notes (Signed)
Pt's mother came to desk and said "y'all have a good night" and walked out the door with pt. [place] notified

## 2017-11-02 NOTE — ED Notes (Signed)
Staffing office notified for sitter.

## 2017-11-02 NOTE — ED Triage Notes (Signed)
Pt reports that he has drank 7 bottles of listerine today. Reports feeling suicidal, tearful in triage.

## 2017-11-03 ENCOUNTER — Other Ambulatory Visit: Payer: Self-pay

## 2017-11-03 LAB — GC/CHLAMYDIA PROBE AMP (~~LOC~~) NOT AT ARMC
CHLAMYDIA, DNA PROBE: NEGATIVE
NEISSERIA GONORRHEA: NEGATIVE

## 2017-11-03 LAB — HIV ANTIBODY (ROUTINE TESTING W REFLEX): HIV Screen 4th Generation wRfx: NONREACTIVE

## 2017-11-03 LAB — RPR: RPR: NONREACTIVE

## 2017-11-03 LAB — OSMOLALITY: OSMOLALITY: 370 mosm/kg — AB (ref 275–295)

## 2017-11-03 IMAGING — CR DG CHEST 2V
2 series · 2 of 2 positions shown · non-contrast
Comparison: 04/14/2015

CLINICAL DATA: Mid chest pain beginning today. Diaphoresis and
shortness of breath. Emesis. Personal history of coronary artery
disease and hypertension. History of breast cancer with mastectomy.

EXAM:
CHEST  2 VIEW

[w chest lat]
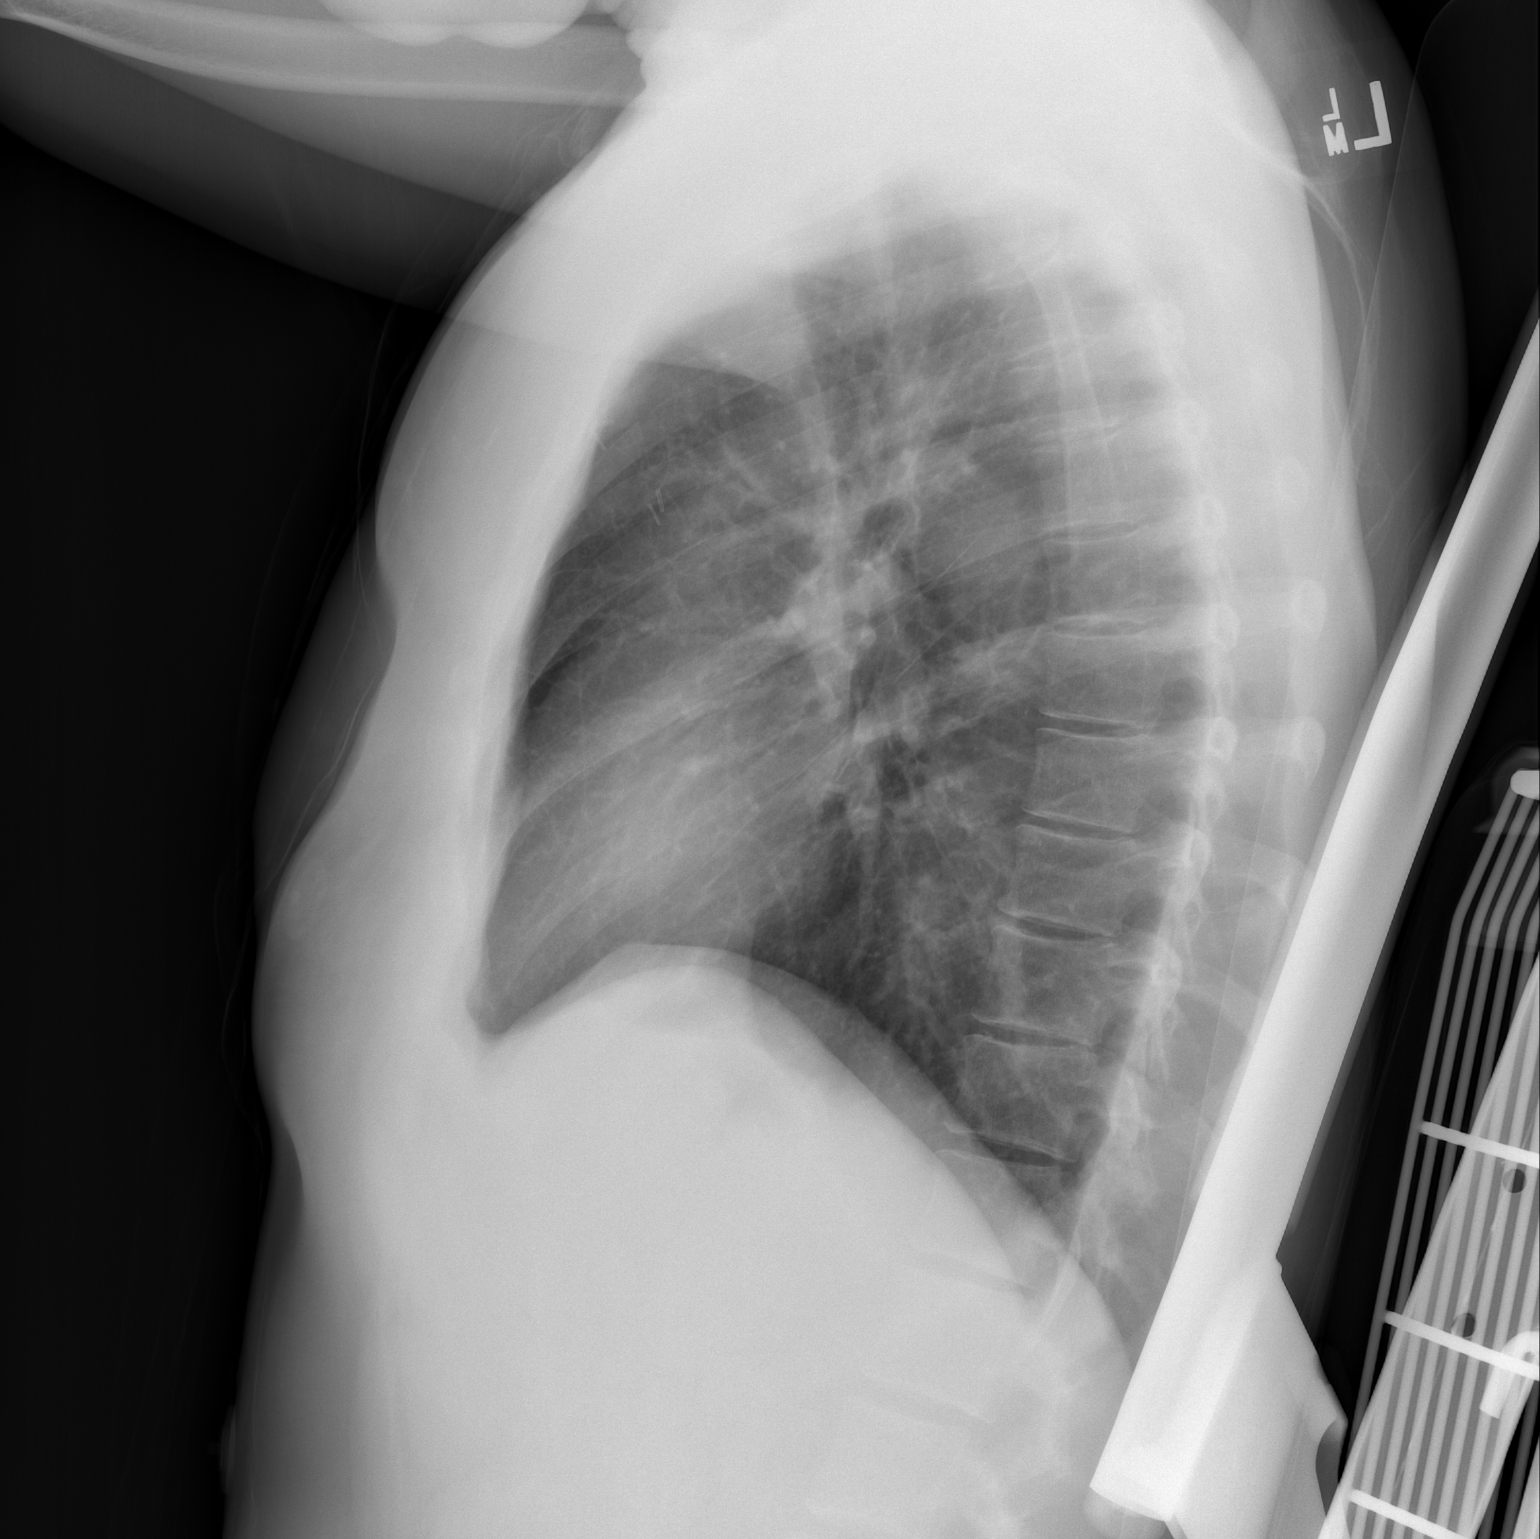

[w chest pa]
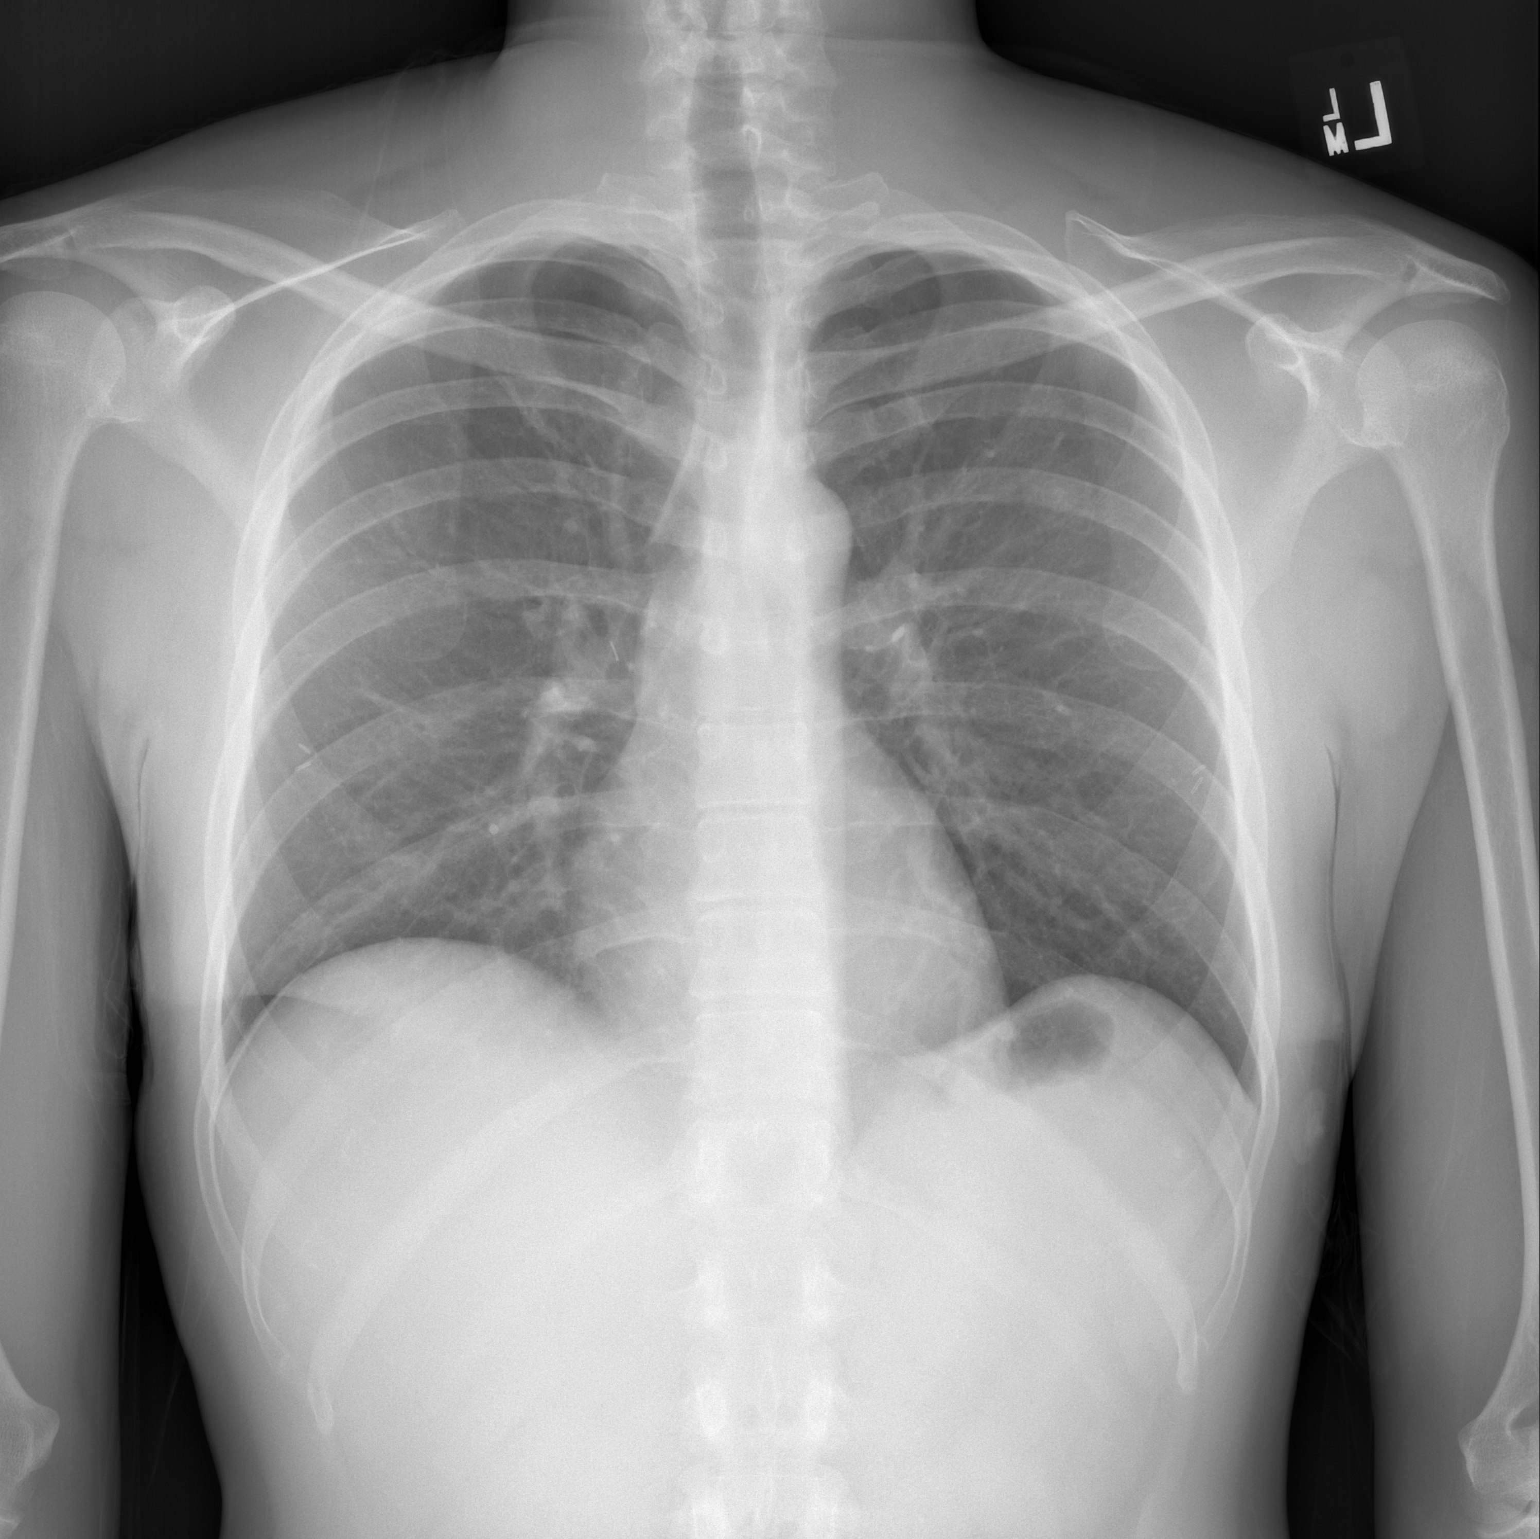

[2 of 2 positions shown; findings below may reference images not displayed]

FINDINGS: Heart size is normal. Mediastinal shadows are normal. The lungs are
clear. The vascularity is normal. No effusions. Chest wall deformity
related to previous surgery. No bone abnormality.
IMPRESSION: No active cardiopulmonary disease.

## 2017-11-03 IMAGING — CT CT ANGIO CHEST
2 of 6 series · 19 of 36 positions shown · IV contrast (OMNIPAQUE 350)
Comparison: Chest radiography same day.  CT 04/15/2014.

CLINICAL DATA: Mid chest pain beginning this morning. Abdominal
pain. Diaphoresis. Emesis.

EXAM:
CT ANGIOGRAPHY CHEST WITH CONTRAST
TECHNIQUE: Multidetector CT imaging of the chest was performed using the
standard protocol during bolus administration of intravenous
contrast. Multiplanar CT image reconstructions and MIPs were
obtained to evaluate the vascular anatomy.
CONTRAST:  100 cc Omnipaque 350

[Series 6: thins for pacs · axial · 0.71mm/px · z∈[-228,+3]mm · 18 of 257 slices shown]
[im 13/257  lung]
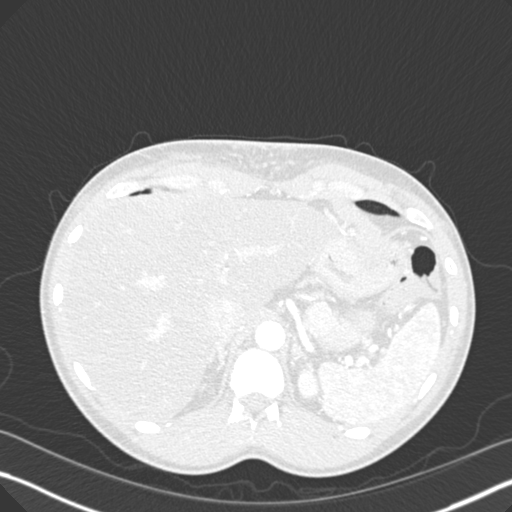
[im 26/257  mediastinal]
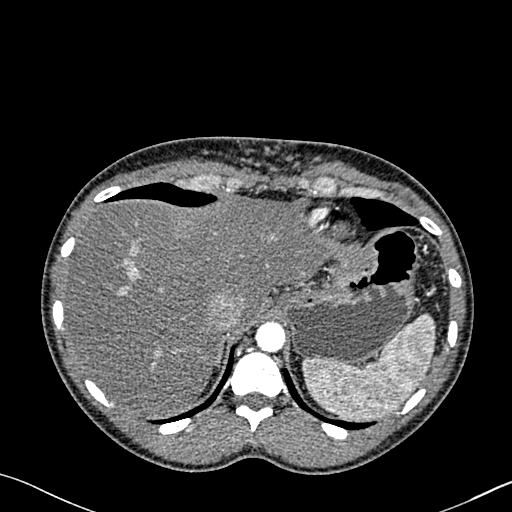
[im 39/257  lung]
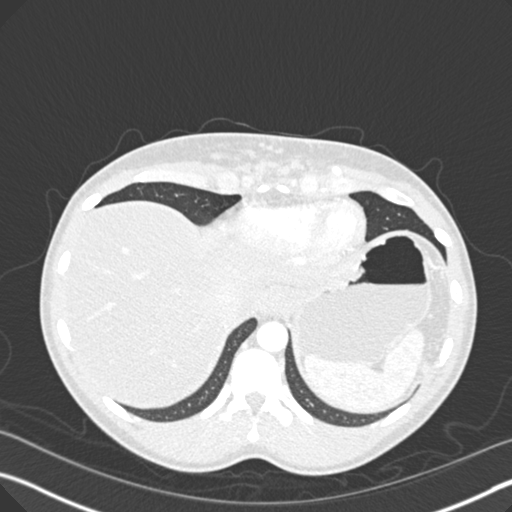
[im 52/257  mediastinal]
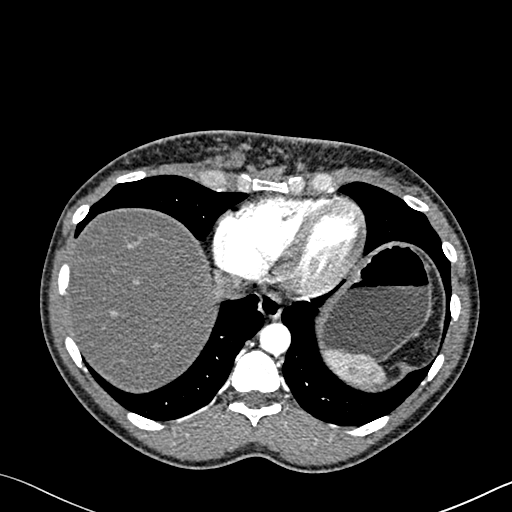
[im 65/257  lung]
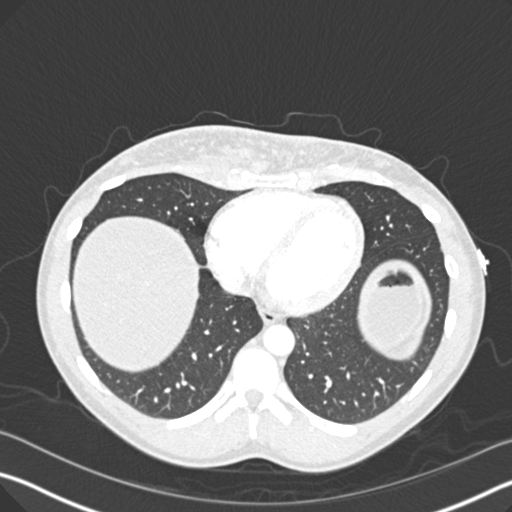
[im 77/257  mediastinal]
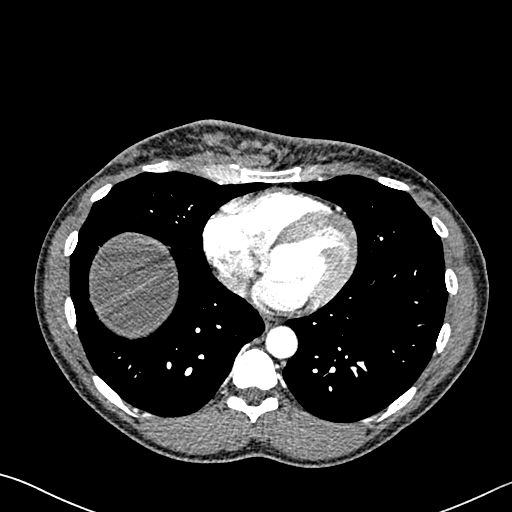
[im 90/257  lung]
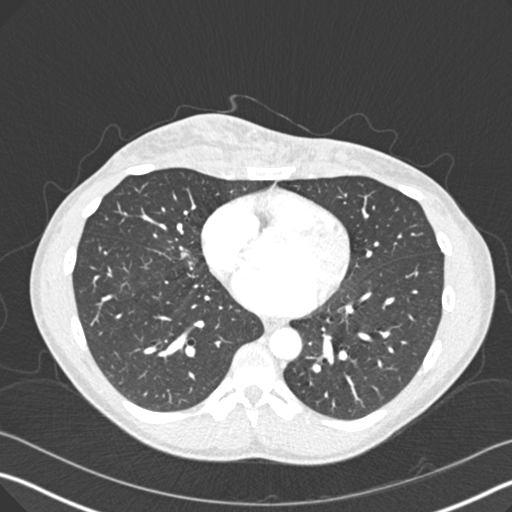
[im 103/257  mediastinal]
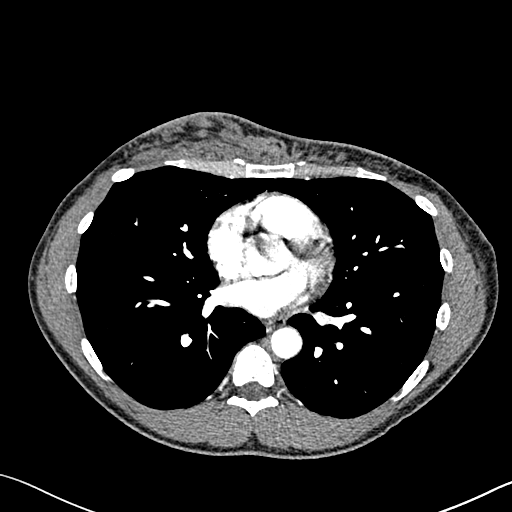
[im 116/257  lung]
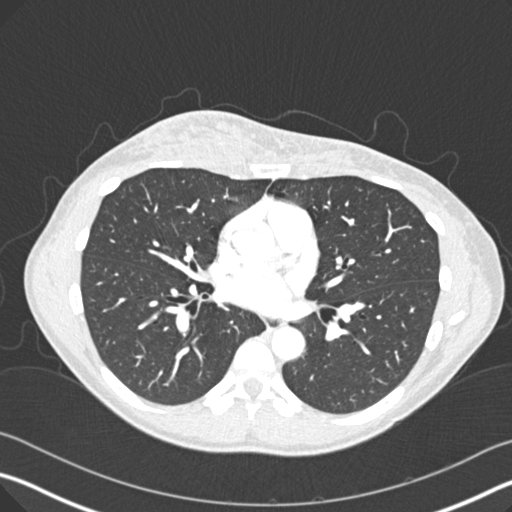
[im 141/257  mediastinal]
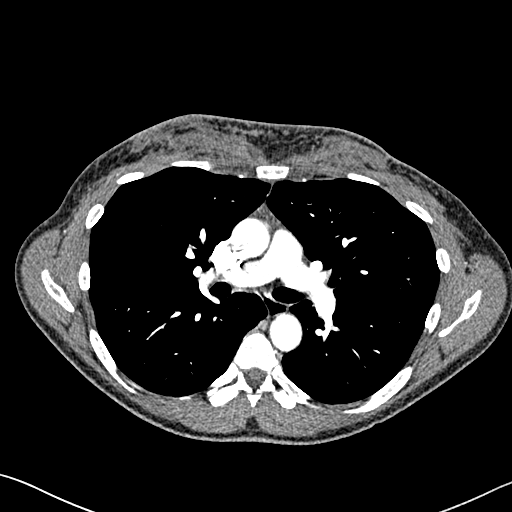
[im 154/257  lung]
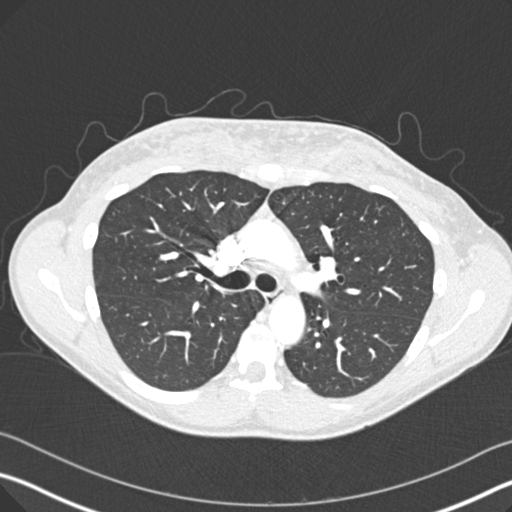
[im 167/257  mediastinal]
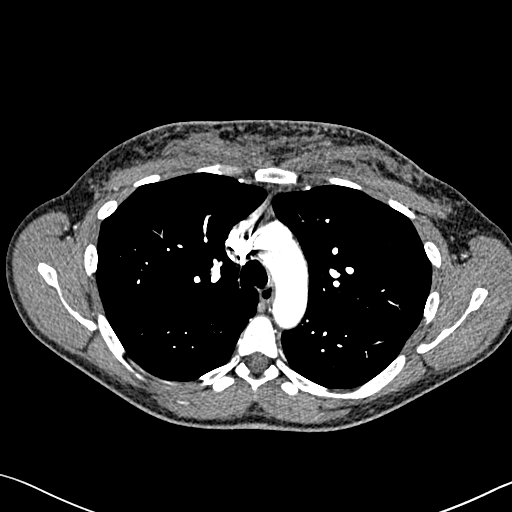
[im 180/257  lung]
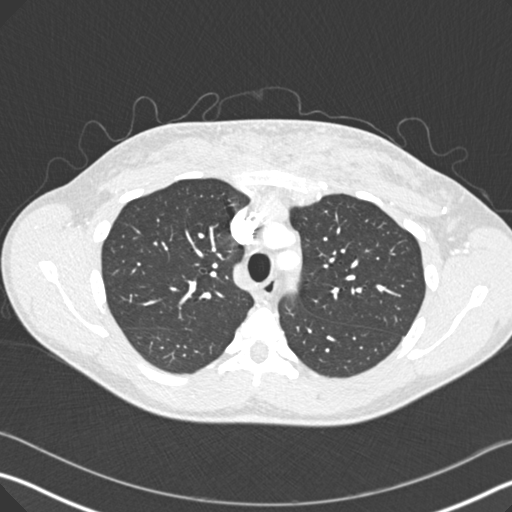
[im 193/257  mediastinal]
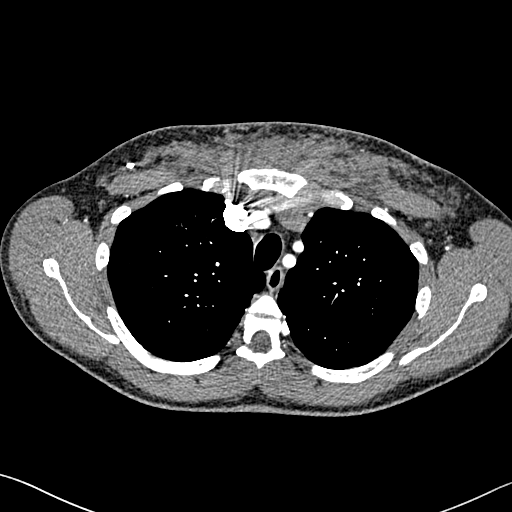
[im 205/257  lung]
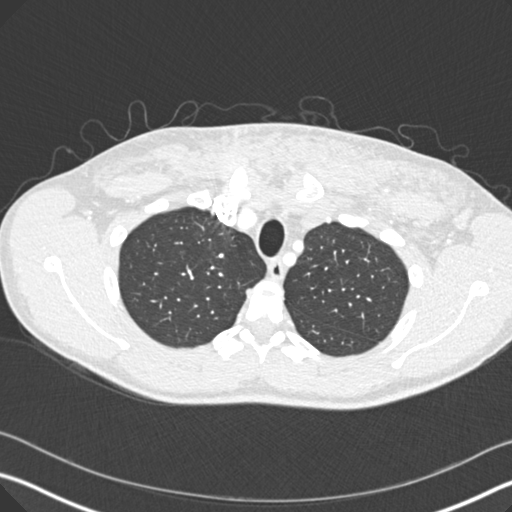
[im 218/257  mediastinal]
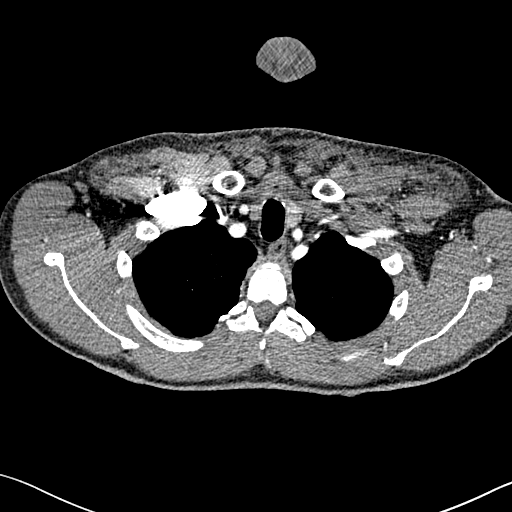
[im 231/257  lung]
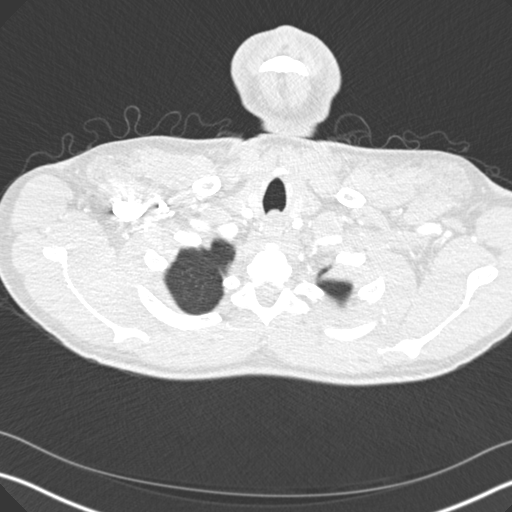
[im 244/257  mediastinal]
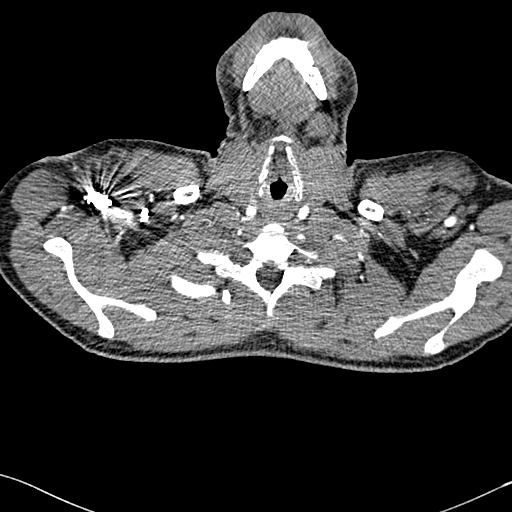

[Series 8: coronal mpr · coronal · 0.54mm/px · 1 of 151 slices shown]
[im 76/151  mediastinal]
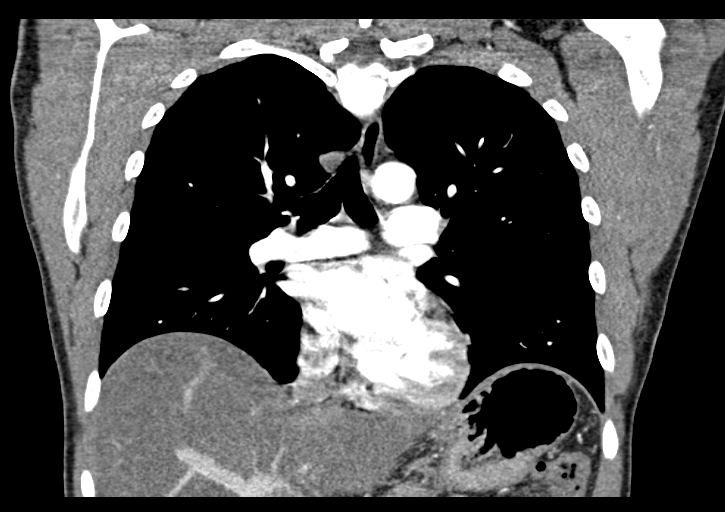

[19 of 36 positions shown; findings below may reference images not displayed]

FINDINGS: Pulmonary arterial opacification is excellent. There are no
pulmonary emboli. No evidence of aortic pathology. No mediastinal or
hilar mass or lymphadenopathy.

The lungs are clear except for minimal patchy density in the right
middle lobe. This could represent atelectasis or minimal right
middle lobe pneumonia. No evidence of mass. No other focal
parenchymal finding.

Scans in the upper abdomen again show advanced fatty change of the
liver.

Postoperative appearance of the anterior soft tissues of the chest
is the same as on the previous exam.

Review of the MIP images confirms the above findings.
IMPRESSION: No pulmonary emboli.

Minimal patchy density in the right middle lobe that could be
atelectasis or minimal right middle lobe pneumonia.

Re- demonstration of severe fatty change of the liver.

## 2017-11-03 MED ORDER — ADULT MULTIVITAMIN W/MINERALS CH
1.0000 | ORAL_TABLET | Freq: Every day | ORAL | Status: DC
  Administered 2017-11-03: 1 via ORAL
  Filled 2017-11-03: qty 1

## 2017-11-03 MED ORDER — ONDANSETRON HCL 4 MG/2ML IJ SOLN: 4.0000 mg | Freq: Four times a day (QID) | INTRAMUSCULAR | Status: DC | PRN

## 2017-11-03 MED ORDER — GABAPENTIN 300 MG PO CAPS
300.0000 mg | ORAL_CAPSULE | Freq: Three times a day (TID) | ORAL | Status: DC
  Administered 2017-11-03: 300 mg via ORAL
  Filled 2017-11-03: qty 1

## 2017-11-03 MED ORDER — ONDANSETRON HCL 4 MG PO TABS: 4.0000 mg | ORAL_TABLET | Freq: Three times a day (TID) | ORAL | 0 refills | Status: DC | PRN

## 2017-11-03 MED ORDER — ADULT MULTIVITAMIN W/MINERALS CH: 1.0000 | ORAL_TABLET | Freq: Every day | ORAL | Status: DC

## 2017-11-03 MED ORDER — THIAMINE HCL 100 MG/ML IJ SOLN: 100.0000 mg | Freq: Every day | INTRAMUSCULAR | Status: DC

## 2017-11-03 MED ORDER — LORAZEPAM 1 MG PO TABS: 0.0000 mg | ORAL_TABLET | Freq: Two times a day (BID) | ORAL | Status: DC

## 2017-11-03 MED ORDER — LORAZEPAM 1 MG PO TABS
2.0000 mg | ORAL_TABLET | Freq: Once | ORAL | Status: AC
  Administered 2017-11-03: 2 mg via ORAL
  Filled 2017-11-03: qty 2

## 2017-11-03 MED ORDER — VITAMIN B-1 100 MG PO TABS: 100.0000 mg | ORAL_TABLET | Freq: Every day | ORAL | Status: DC

## 2017-11-03 MED ORDER — VITAMIN B-1 100 MG PO TABS
100.0000 mg | ORAL_TABLET | Freq: Every day | ORAL | Status: DC
  Administered 2017-11-03: 100 mg via ORAL
  Filled 2017-11-03: qty 1

## 2017-11-03 MED ORDER — LORAZEPAM 1 MG PO TABS: 0.0000 mg | ORAL_TABLET | Freq: Four times a day (QID) | ORAL | Status: DC

## 2017-11-03 MED ORDER — FOLIC ACID 1 MG PO TABS
1.0000 mg | ORAL_TABLET | Freq: Every day | ORAL | Status: DC
  Administered 2017-11-03: 1 mg via ORAL
  Filled 2017-11-03: qty 1

## 2017-11-03 MED ORDER — ONDANSETRON HCL 4 MG PO TABS: 4.0000 mg | ORAL_TABLET | Freq: Three times a day (TID) | ORAL | Status: DC | PRN

## 2017-11-03 MED ORDER — LORAZEPAM 1 MG PO TABS: 1.0000 mg | ORAL_TABLET | Freq: Four times a day (QID) | ORAL | Status: DC | PRN

## 2017-11-03 MED ORDER — GABAPENTIN 300 MG PO CAPS: 300.0000 mg | ORAL_CAPSULE | Freq: Three times a day (TID) | ORAL | 0 refills | Status: DC

## 2017-11-03 MED ORDER — LORAZEPAM 2 MG/ML IJ SOLN: 1.0000 mg | Freq: Four times a day (QID) | INTRAMUSCULAR | Status: DC | PRN

## 2017-11-03 NOTE — ED Notes (Signed)
TTS assessment in progress. 

## 2017-11-03 NOTE — Discharge Instructions (Addendum)
For your mental health needs, you are advised to continue treatment with [facility]: ° °     [facility] °     [address] °     [place], [place] [address] °     [phone] °

## 2017-11-03 NOTE — ED Notes (Addendum)
Pt belongings was move from locker#26 to locker #40 one bag

## 2017-11-03 NOTE — [facility] Suicide Risk Assessment (Signed)
Suicide Risk Assessment  Discharge Assessment   [facility] Discharge Suicide Risk Assessment   Principal Problem: Alcohol use disorder, severe, dependence [facility]) Discharge Diagnoses:  Patient Active Problem List   Diagnosis Date Noted  . Alcohol use disorder, severe, dependence ([place]) [F10.20] 04/15/2015    Priority: High  . Cocaine abuse ([place]) [F14.10] 04/16/2014    Priority: High  . PTSD (post-traumatic stress disorder) [F43.10] 07/06/2012    Priority: High  . Episode of recurrent major depressive disorder ([place]) [F33.9] 06/29/2012    Priority: High    Class: Acute  . Emesis [R11.10] 10/20/2017  . Displaced fracture of lateral end of left clavicle, initial encounter for closed fracture [S42.032A] 08/05/2017  . Coracoclavicular (ligament) sprain and strain, left, initial encounter [S43.82XA] 08/05/2017  . Closed dislocation of acromioclavicular joint, initial encounter [F02.774J] 08/05/2017  . Insomnia [G47.00]   . Hypertension [I10]   . Severe episode of recurrent major depressive disorder, without psychotic features ([place]) [F33.2]   . Depression, major, severe recurrence ([place]) [F33.2] 12/30/2015  . Substance induced mood disorder ([place]) [F19.94] 12/02/2015  . Mood disorder in conditions classified elsewhere [F06.30]   . Malnutrition of moderate degree [E44.0] 09/24/2015  . Alcohol withdrawal ([place]) [F10.239] 09/23/2015  . Tobacco use disorder [F17.200] 07/16/2015  . Elevated transaminase level [R74.0]   . Drug overdose, intentional ([place]) [T50.902A] 07/12/2015  . Cocaine abuse with cocaine-induced mood disorder ([place]) [F14.14] 04/11/2015  . Overdose [T50.901A] 04/10/2015  . Severe recurrent major depressive disorder with psychotic features ([place]) [F33.3]   . Severe major depression with psychotic features ([place]) [F32.3] 09/11/2014  . Alcohol-induced mood disorder ([place]) [F10.94] 09/10/2014  . Suicidal ideation [R45.851]   . Tylenol overdose [T39.1X1A]   . Polysubstance abuse ([place]) [F19.10]    . Overdose of acetaminophen [T39.1X1A] 08/03/2014  . Overdose by acetaminophen [T39.1X1A] 08/03/2014  . Thrombocytopenia ([place]) [D69.6] 04/15/2014  . Urinary tract infection, site not specified [N39.0] 04/15/2014  . Chest pain, unspecified [R07.9] 04/15/2014  . D-dimer, elevated [R79.89] 04/15/2014  . Transaminitis [R74.0] 09/24/2013  . Cocaine dependence ([place]) [F14.20] 09/20/2013  . 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 Seizure ([place]) [R56.9] 03/15/2013  . Syncope [R55] 01/02/2013  . Leukocytopenia, unspecified [D72.819] 01/02/2013  . Left kidney mass [N28.89] 12/24/2012  . Peripheral vascular disease ([place]) [I73.9] 01/14/2012  . Alcohol abuse [F10.10] 10/13/2011  . SEIZURE DISORDER [R56.9] 10/03/2008  . Elevated LFTs [R94.5] 12/29/2007  . HYPERCHOLESTEROLEMIA [E78.00] 03/21/2007  . Essential hypertension [I10] 03/21/2007    Total Time spent with patient: 45 minutes  Musculoskeletal: Strength & Muscle Tone: within normal limits Gait & Station: normal Patient leans: N/A  Psychiatric Specialty Exam:   Blood pressure (!) 147/91, pulse 90, temperature 98.4 F (36.9 C), temperature source Oral, resp. rate 17, height 5\' 6"  (1.676 m), weight 66.2 kg (146 lb), SpO2 96 %.Body mass index is 23.57 kg/m.  General Appearance: Casual  Eye Contact::  Good  Speech:  Normal Rate409  Volume:  Normal  Mood:  Irritable  Affect:  Congruent  Thought Process:  Coherent and Descriptions of Associations: Intact  Orientation:  Full (Time, Place, and Person)  Thought Content:  WDL and Logical  Suicidal Thoughts:  No  Homicidal Thoughts:  No  Memory:  Immediate;   Good Recent;   Good Remote;   Good  Judgement:  Fair  Insight:  Fair  Psychomotor Activity:  Normal  Concentration:  Good  Recall:  Good  Fund of Knowledge:Fair  Language: Good  Akathisia:  No  Handed:  Right  AIMS (if indicated):     Assets:  Leisure Time Physical Health Resilience Social Support  Sleep:     Cognition:  WNL  ADL's:  Intact   Mental Status Per Nursing Assessment::   On Admission:   37 yo male, well known to this ED, who presented under the influence of alcohol.  Denies suicidal/homicidal ideations, hallucinations, or withdrawal symptoms.  Some nausea from drinking listerine.  He was initially interested in rehab/recovery, then changed his mind when Peer Support called him.  Demographic Factors:  Male and Living alone  Loss Factors: NA  Historical Factors: NA  Risk Reduction Factors:   Sense of responsibility to family, Positive social support and Positive therapeutic relationship  Continued Clinical Symptoms:  Irritable  Cognitive Features That Contribute To Risk:  None    Suicide Risk:  Minimal: No identifiable suicidal ideation.  Patients presenting with no risk factors but with morbid ruminations; may be classified as minimal risk based on the severity of the depressive symptoms    Plan Of Care/Follow-up recommendations:  Activity:  as tolerated Diet:  heart healthy diet  Aniylah Avans, NP 11/03/2017, 1:58 PM

## 2017-11-03 NOTE — ED Notes (Signed)
Date and time results received: 11/03/17 12:07 AM  (use smartphrase ".now" to insert current time)  Test: Serum Serum Osmo Critical Value: 370  Name of Provider Notified: Alferd Apa Orders Received? Or Actions Taken?:   No orders received
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
## 2017-11-03 NOTE — Patient Outreach (Signed)
ED Peer Support Specialist Patient Intake (Complete at intake & 30-60 Day Follow-up)  Name: [name]  MRN: [id number]  Age: 37 y.o.   Date of Admission: 11/03/2017  Intake: Initial Comments:      Primary Reason Admitted: SI, depression, poly substance use with alcohol and cocaine  Lab values: Alcohol/ETOH: Positive Positive UDS? Yes Amphetamines: No Barbiturates: No Benzodiazepines: No Cocaine: Yes Opiates: No Cannabinoids: No  Demographic information: Gender: Male Ethnicity: African American Marital Status: Single Insurance Status: Uninsured/Self-pay Ecologist (Work Neurosurgeon, Physicist, medical, etc.: Yes(orange card, food stamps) Lives with: Alone Living situation: Other (comment)(Hotel)  Reported Patient History: Patient reported health conditions: Bipolar disorder, Hypertension, Schizophrenia, Depression(Anxiety) Patient aware of HIV and hepatitis status: Yes (comment)(Negative)  In past year, has patient visited ED for any reason? Yes  Number of ED visits: 1  Reason(s) for visit: SI, depression  In past year, has patient been hospitalized for any reason? Yes  Number of hospitalizations: 2  Reason(s) for hospitalization: Surgery in January, [facility] January   In past year, has patient been arrested? Yes  Number of arrests: 1  Reason(s) for arrest: larceny  In past year, has patient been incarcerated? No  Number of incarcerations:    Reason(s) for incarceration:    In past year, has patient received medication-assisted treatment? No  In past year, patient received the following treatments:    In past year, has patient received any harm reduction services? No  Did this include any of the following?    In past year, has patient received care from a mental health provider for diagnosis other than SUD? Yes([facility] for mental health medications)  In past year, is this first time patient has overdosed? (has not overdosed)  Number  of past overdoses:    In past year, is this first time patient has been hospitalized for an overdose? (has not overdosed)  Number of hospitalizations for overdose(s):    Is patient currently receiving treatment for a mental health diagnosis? No  Patient reports experiencing difficulty participating in SUD treatment: No    Most important reason(s) for this difficulty?    Has patient received prior services for treatment? Yes([facility], [facility] in January)  In past, patient has received services from following agencies:    Plan of Care:  Suggested follow up at these agencies/treatment centers: (Patient only wants [facility] admission. Patient does not want any help with substance use treatment resources.)  Other information: CPSS explained to the patient that CPSS does not decide whether or not a patient is accepted to [facility].   Mason Jim, CPSS  11/03/2017 12:26 PM

## 2017-11-03 NOTE — ED Notes (Signed)
Patient denies pain and is resting comfortably.  

## 2017-11-03 NOTE — ED Notes (Signed)
Patient continues to endorse SI and denies HI/AVH. Plan of care discussed. Encouragement and support provided and safety maintain. Q 15 min safety checks in place and video monitoring.

## 2017-11-03 NOTE — Patient Outreach (Signed)
CPSS spoke with the patient about how he was not accepted to [facility] and is going to be discharged.  Patient is now interested in substance use treatment for detox. CPSS tried [facility] and [facility] for a male detox bed, but currently they both do not any detox beds available until tomorrow. CPSS will provide information for other detox programs in the area, residential/outpatient substance use treatment list, AA/NA meeting list, and CPSS contact information. CPSS encouraged the patient to contact CPSS at anytime for substance use recovery support or help with substance use recovery resources.

## 2017-11-03 NOTE — ED Notes (Signed)
Received call from [employer], [place].  Provided most recent VS & lab results [place] requested.  Per Poison Control, closing report.

## 2017-11-03 NOTE — BH Assessment (Addendum)
[facility] Assessment Progress Note  Per Buford Dresser, DO, this pt does not require psychiatric hospitalization at this time.  Pt is to discharged from [facility] with recommendation to continue treatment with [facility].  This has been included in pt's discharge instructions.  Pt would also benefit from seeing Peer Support Specialists; they will be asked to speak to pt.  Pt's nurse has been notified.  Jalene Mullet, [place] Triage Specialist [phone]

## 2017-11-14 ENCOUNTER — Ambulatory Visit (INDEPENDENT_AMBULATORY_CARE_PROVIDER_SITE_OTHER): Payer: Self-pay | Admitting: Orthopaedic Surgery

## 2017-11-14 ENCOUNTER — Ambulatory Visit (INDEPENDENT_AMBULATORY_CARE_PROVIDER_SITE_OTHER): Payer: Self-pay

## 2017-11-14 ENCOUNTER — Encounter (INDEPENDENT_AMBULATORY_CARE_PROVIDER_SITE_OTHER): Payer: Self-pay | Admitting: Orthopaedic Surgery

## 2017-11-14 DIAGNOSIS — S42032A Displaced fracture of lateral end of left clavicle, initial encounter for closed fracture: Secondary | ICD-10-CM

## 2017-11-14 NOTE — Progress Notes (Signed)
Post-Op Visit Note   Patient: [name]           Date of Birth: 06/01/1981           MRN: [id number] Visit Date: 11/14/2017 PCP: Patient, No Pcp Per   Assessment & Plan:  Chief Complaint:  Chief Complaint  Patient presents with  . Left Shoulder - Pain   Visit Diagnoses:  1. Displaced fracture of lateral end of left clavicle, initial encounter for closed fracture     Plan: Patient is a pleasant 37 year old gentleman who comes in today 96 days status post ORIF left clavicle fracture with reconstruction of the CC ligament utilizing an Endobutton, date of surgery 08/10/2017.  He has been doing very well.  Is unable to go to more than one PT visit due to finances.  He has since obtained his orange card and has his first visit at [facility] and wellness on Wednesday.  He does admit to pain sleeping on the left side.  Overall doing much better.  Examination of the left clavicle reveals no tenderness to palpation.  Full range of motion of the shoulder.  He is neurovascularly intact distally.  At this point, we will have the patient go to a few outpatient PT sessions.  He will follow-up with [place] in 7 weeks time for recheck.  Call with concerns or questions in the meantime.  Follow-Up Instructions: Return in about 7 weeks (around 01/02/2018).   Orders:  Orders Placed This Encounter  Procedures  . XR Clavicle Left   No orders of the defined types were placed in this encounter.   Imaging: Xr Clavicle Left  Result Date: 11/14/2017 X-rays show stable alignment of the fracture with great callus formation   PMFS History: Patient Active Problem List   Diagnosis Date Noted  . Emesis 10/20/2017  . Displaced fracture of lateral end of left clavicle, initial encounter for closed fracture 08/05/2017  . Coracoclavicular (ligament) sprain and strain, left, initial encounter 08/05/2017  . Closed dislocation of acromioclavicular joint, initial encounter 08/05/2017  . Insomnia   . Hypertension    . Severe episode of recurrent major depressive disorder, without psychotic features ([place])   . Depression, major, severe recurrence ([place]) 12/30/2015  . Substance induced mood disorder ([place]) 12/02/2015  . Mood disorder in conditions classified elsewhere   . Malnutrition of moderate degree 09/24/2015  . Alcohol withdrawal ([place]) 09/23/2015  . Tobacco use disorder 07/16/2015  . Elevated transaminase level   . Drug overdose, intentional ([place]) 07/12/2015  . Alcohol use disorder, severe, dependence ([place]) 04/15/2015  . Cocaine abuse with cocaine-induced mood disorder ([place]) 04/11/2015  . Overdose 04/10/2015  . Severe recurrent major depressive disorder with psychotic features ([place])   . Severe major depression with psychotic features ([place]) 09/11/2014  . Alcohol-induced mood disorder ([place]) 09/10/2014  . Suicidal ideation   . Tylenol overdose   . Polysubstance abuse ([place])   . Overdose of acetaminophen 08/03/2014  . Overdose by acetaminophen 08/03/2014  . Cocaine abuse ([place]) 04/16/2014  . Thrombocytopenia ([place]) 04/15/2014  . Urinary tract infection, site not specified 04/15/2014  . Chest pain, unspecified 04/15/2014  . D-dimer, elevated 04/15/2014  . Transaminitis 09/24/2013  . Cocaine dependence ([place]) 09/20/2013  . S/p nephrectomy 04/28/2013  . Seizure ([place]) 03/15/2013  . Syncope 01/02/2013  . Leukocytopenia, unspecified 01/02/2013  . Left kidney mass 12/24/2012  . PTSD (post-traumatic stress disorder) 07/06/2012  . Episode of recurrent major depressive disorder ([place]) 06/29/2012    Class: Acute  .  Peripheral vascular disease ([place]) 01/14/2012  . Alcohol abuse 10/13/2011  . SEIZURE DISORDER 10/03/2008  . Elevated LFTs 12/29/2007  . HYPERCHOLESTEROLEMIA 03/21/2007  . Essential hypertension 03/21/2007   Past Medical History:  Diagnosis Date  . Angina   . Anxiety    panic attack  . Bipolar 1 disorder ([place])   . Breast CA ([place]) dx'd 2009   bil w/ bil masectomy and oral meds  . Cancer  [facility])    kidney cancer  . Coronary artery disease   . Depression   . H/O suicide attempt 2015   overdose  . Headache(784.0)   . Hypercholesteremia   . Hypertension   . Liver cirrhosis ([place])   . Pancreatitis   . Peripheral vascular disease [facility]) April 2011   Left Pop  . Schizophrenia ([place])   . Seizures ([place])    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  . Shortness of breath     Family History  Problem Relation Age of Onset  . Stroke Other   . Cancer Other   . Hyperlipidemia Mother   . Hypertension Mother     Past Surgical History:  Procedure Laterality Date  . BREAST SURGERY    . BREAST SURGERY     bilateral breast silocone  removal  . CHEST SURGERY    . left kidney removal    . left leg surgery     "popiteal artery clogged"  . MASTECTOMY Bilateral   . NEPHRECTOMY Left   . ORIF CLAVICULAR FRACTURE Left 08/10/2017   Procedure: OPEN REDUCTION INTERNAL FIXATION (ORIF) LEFT CLAVICLE FRACTURE WITH RECONSTRUCTION OF CORACOCLAVICULAR LIGAMENT;  Surgeon: Leandrew Koyanagi, MD;  Location: [facility];  Service: Orthopedics;  Laterality: Left;  . RECONSTRUCTION OF CORACOCLAVICULAR LIGAMENT Left 08/10/2017   Procedure: RECONSTRUCTION OF CORACOCLAVICULAR LIGAMENT;  Surgeon: Leandrew Koyanagi, MD;  Location: [facility];  Service: Orthopedics;  Laterality: Left;   Social History   Occupational History  . Not on file  Tobacco Use  . Smoking status: Former Research scientist (life sciences)  . Smokeless tobacco: Never Used  . Tobacco comment: smoked on and off 16 years- quit 2016  Substance and Sexual Activity  . Alcohol use: Yes    Comment: 08/08/17- No alcohol since 07/26/17; Pt stated as of 11/02/17 "now drinking Listerine"  . Drug use: No    Frequency: 1.0 times per week    Types: "Crack" cocaine, Cocaine    Comment: 08/08/2017- no cocaine for 1 month  . Sexual activity: Yes    Birth control/protection: Condom    Comment: anal

## 2017-11-15 ENCOUNTER — Inpatient Hospital Stay (INDEPENDENT_AMBULATORY_CARE_PROVIDER_SITE_OTHER): Payer: Self-pay | Admitting: Physician Assistant

## 2017-11-17 ENCOUNTER — Emergency Department (HOSPITAL_COMMUNITY)
Admission: EM | Admit: 2017-11-17 | Discharge: 2017-11-17 | Disposition: A | Discharge status: Home / Self Care | Payer: Self-pay | Attending: Emergency Medicine | Admitting: Emergency Medicine

## 2017-11-17 ENCOUNTER — Encounter (HOSPITAL_COMMUNITY): Payer: Self-pay | Admitting: Emergency Medicine

## 2017-11-17 ENCOUNTER — Other Ambulatory Visit: Payer: Self-pay

## 2017-11-17 DIAGNOSIS — I10 Essential (primary) hypertension: Secondary | ICD-10-CM | POA: Insufficient documentation

## 2017-11-17 DIAGNOSIS — Z853 Personal history of malignant neoplasm of breast: Secondary | ICD-10-CM | POA: Insufficient documentation

## 2017-11-17 DIAGNOSIS — Z79899 Other long term (current) drug therapy: Secondary | ICD-10-CM | POA: Insufficient documentation

## 2017-11-17 DIAGNOSIS — F10920 Alcohol use, unspecified with intoxication, uncomplicated: Secondary | ICD-10-CM | POA: Insufficient documentation

## 2017-11-17 DIAGNOSIS — I2510 Atherosclerotic heart disease of native coronary artery without angina pectoris: Secondary | ICD-10-CM | POA: Insufficient documentation

## 2017-11-17 DIAGNOSIS — Z85528 Personal history of other malignant neoplasm of kidney: Secondary | ICD-10-CM | POA: Insufficient documentation

## 2017-11-17 DIAGNOSIS — Z87891 Personal history of nicotine dependence: Secondary | ICD-10-CM | POA: Insufficient documentation

## 2017-11-17 DIAGNOSIS — E162 Hypoglycemia, unspecified: Secondary | ICD-10-CM | POA: Insufficient documentation

## 2017-11-17 LAB — CBC WITH DIFFERENTIAL/PLATELET
Basophils Absolute: 0 10*3/uL (ref 0.0–0.1)
Basophils Relative: 1 %
EOS ABS: 0.1 10*3/uL (ref 0.0–0.7)
Eosinophils Relative: 1 %
HCT: 41.4 % (ref 39.0–52.0)
Hemoglobin: 14.2 g/dL (ref 13.0–17.0)
LYMPHS ABS: 2.3 10*3/uL (ref 0.7–4.0)
LYMPHS PCT: 64 %
MCH: 29.5 pg (ref 26.0–34.0)
MCHC: 34.3 g/dL (ref 30.0–36.0)
MCV: 85.9 fL (ref 78.0–100.0)
MONO ABS: 0.2 10*3/uL (ref 0.1–1.0)
Monocytes Relative: 6 %
Neutro Abs: 1 10*3/uL — ABNORMAL LOW (ref 1.7–7.7)
Neutrophils Relative %: 28 %
PLATELETS: 66 10*3/uL — AB (ref 150–400)
RBC: 4.82 MIL/uL (ref 4.22–5.81)
RDW: 14.9 % (ref 11.5–15.5)
WBC: 3.6 10*3/uL — AB (ref 4.0–10.5)

## 2017-11-17 LAB — ETHANOL: ALCOHOL ETHYL (B): 415 mg/dL — AB (ref <10)

## 2017-11-17 LAB — URINALYSIS, ROUTINE W REFLEX MICROSCOPIC
BACTERIA UA: NONE SEEN
BILIRUBIN URINE: NEGATIVE
GLUCOSE, UA: NEGATIVE mg/dL
Ketones, ur: NEGATIVE mg/dL
LEUKOCYTES UA: NEGATIVE
NITRITE: NEGATIVE
PROTEIN: NEGATIVE mg/dL
SPECIFIC GRAVITY, URINE: 1.012 (ref 1.005–1.030)
pH: 5 (ref 5.0–8.0)

## 2017-11-17 LAB — COMPREHENSIVE METABOLIC PANEL
ALT: 305 U/L — AB (ref 17–63)
ANION GAP: 11 (ref 5–15)
AST: 469 U/L — ABNORMAL HIGH (ref 15–41)
Albumin: 3.5 g/dL (ref 3.5–5.0)
Alkaline Phosphatase: 70 U/L (ref 38–126)
BUN: 8 mg/dL (ref 6–20)
CHLORIDE: 103 mmol/L (ref 101–111)
CO2: 26 mmol/L (ref 22–32)
CREATININE: 0.86 mg/dL (ref 0.61–1.24)
Calcium: 8.1 mg/dL — ABNORMAL LOW (ref 8.9–10.3)
Glucose, Bld: 74 mg/dL (ref 65–99)
POTASSIUM: 4.6 mmol/L (ref 3.5–5.1)
SODIUM: 140 mmol/L (ref 135–145)
Total Bilirubin: 0.8 mg/dL (ref 0.3–1.2)
Total Protein: 6.6 g/dL (ref 6.5–8.1)

## 2017-11-17 LAB — RAPID URINE DRUG SCREEN, HOSP PERFORMED
Amphetamines: NOT DETECTED
Barbiturates: NOT DETECTED
Benzodiazepines: NOT DETECTED
Cocaine: NOT DETECTED
OPIATES: NOT DETECTED
Tetrahydrocannabinol: NOT DETECTED

## 2017-11-17 LAB — CBG MONITORING, ED
GLUCOSE-CAPILLARY: 64 mg/dL — AB (ref 65–99)
GLUCOSE-CAPILLARY: 76 mg/dL (ref 65–99)

## 2017-11-17 LAB — LIPASE, BLOOD: LIPASE: 37 U/L (ref 11–51)

## 2017-11-17 LAB — SALICYLATE LEVEL

## 2017-11-17 LAB — ACETAMINOPHEN LEVEL: Acetaminophen (Tylenol), Serum: <10 ug/mL — ABNORMAL LOW (ref 10–30)

## 2017-11-17 NOTE — ED Provider Notes (Signed)
[facility] EMERGENCY DEPARTMENT Provider Note   CSN: [id number] Arrival date & time: 11/17/17  1343     History   Chief Complaint Chief Complaint  Patient presents with  . Alcohol Intoxication    HPI [name] is a 36 y.o. male sent by his mother for intoxication after drinking 3 bottles of listerine mouthwash prior to arrival. Patient is somnolent and poor historian. States that he was sent here and doesn't know where he is or why he is here. He denies fever, chills nausea, vomiting, diarrhea  HPI  Past Medical History:  Diagnosis Date  . Angina   . Anxiety    panic attack  . Bipolar 1 disorder ([place])   . Breast CA ([place]) dx'd 2009   bil w/ bil masectomy and oral meds  . Cancer [facility])    kidney cancer  . Coronary artery disease   . Depression   . H/O suicide attempt 2015   overdose  . Headache(784.0)   . Hypercholesteremia   . Hypertension   . Liver cirrhosis ([place])   . Pancreatitis   . Peripheral vascular disease [facility]) April 2011   Left Pop  . Schizophrenia ([place])   . Seizures ([place])    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  . Shortness of breath     Patient Active Problem List   Diagnosis Date Noted  . Emesis 10/20/2017  . Displaced fracture of lateral end of left clavicle, initial encounter for closed fracture 08/05/2017  . Coracoclavicular (ligament) sprain and strain, left, initial encounter 08/05/2017  . Closed dislocation of acromioclavicular joint, initial encounter 08/05/2017  . Insomnia   . Hypertension   . Severe episode of recurrent major depressive disorder, without psychotic features ([place])   . Depression, major, severe recurrence ([place]) 12/30/2015  . Substance induced mood disorder ([place]) 12/02/2015  . Mood disorder in conditions classified elsewhere   . Malnutrition of moderate degree 09/24/2015  . Alcohol withdrawal ([place]) 09/23/2015  . Tobacco use disorder 07/16/2015  . Elevated transaminase level   . Drug overdose, intentional  ([place]) 07/12/2015  . Alcohol use disorder, severe, dependence ([place]) 04/15/2015  . Cocaine abuse with cocaine-induced mood disorder ([place]) 04/11/2015  . Overdose 04/10/2015  . Severe recurrent major depressive disorder with psychotic features ([place])   . Severe major depression with psychotic features ([place]) 09/11/2014  . Alcohol-induced mood disorder ([place]) 09/10/2014  . Suicidal ideation   . Tylenol overdose   . Polysubstance abuse ([place])   . Overdose of acetaminophen 08/03/2014  . Overdose by acetaminophen 08/03/2014  . Cocaine abuse ([place]) 04/16/2014  . Thrombocytopenia ([place]) 04/15/2014  . Urinary tract infection, site not specified 04/15/2014  . Chest pain, unspecified 04/15/2014  . D-dimer, elevated 04/15/2014  . Transaminitis 09/24/2013  . Cocaine dependence ([place]) 09/20/2013  . S/p nephrectomy 04/28/2013  . Seizure ([place]) 03/15/2013  . Syncope 01/02/2013  . Leukocytopenia, unspecified 01/02/2013  . Left kidney mass 12/24/2012  . PTSD (post-traumatic stress disorder) 07/06/2012  . Episode of recurrent major depressive disorder ([place]) 06/29/2012    Class: Acute  . Peripheral vascular disease ([place]) 01/14/2012  . Alcohol abuse 10/13/2011  . SEIZURE DISORDER 10/03/2008  . Elevated LFTs 12/29/2007  . HYPERCHOLESTEROLEMIA 03/21/2007  . Essential hypertension 03/21/2007    Past Surgical History:  Procedure Laterality Date  . BREAST SURGERY    . BREAST SURGERY     bilateral breast silocone  removal  . CHEST SURGERY    . left kidney removal    .  left leg surgery     "popiteal artery clogged"  . MASTECTOMY Bilateral   . NEPHRECTOMY Left   . ORIF CLAVICULAR FRACTURE Left 08/10/2017   Procedure: OPEN REDUCTION INTERNAL FIXATION (ORIF) LEFT CLAVICLE FRACTURE WITH RECONSTRUCTION OF CORACOCLAVICULAR LIGAMENT;  Surgeon: Leandrew Koyanagi, MD;  Location: [facility];  Service: Orthopedics;  Laterality: Left;  . RECONSTRUCTION OF CORACOCLAVICULAR LIGAMENT Left 08/10/2017   Procedure: RECONSTRUCTION OF  CORACOCLAVICULAR LIGAMENT;  Surgeon: Leandrew Koyanagi, MD;  Location: [facility];  Service: Orthopedics;  Laterality: Left;        Home Medications    Prior to Admission medications   Medication Sig Start Date End Date Taking? Authorizing Provider  amLODipine (NORVASC) 5 MG tablet Take 1 tablet (5 mg total) by mouth daily. For high blood pressure Patient taking differently: Take 5 mg by mouth 2 (two) times daily. For high blood pressure 08/04/17  Yes Money, Lowry Ram, FNP  atorvastatin (LIPITOR) 20 MG tablet Take 1 tablet (20 mg total) by mouth daily at 6 PM. For high cholesterol 08/03/17  Yes Money, Lowry Ram, FNP  gabapentin (NEURONTIN) 300 MG capsule Take 1 capsule (300 mg total) by mouth 3 (three) times daily. 11/03/17  Yes [name], NP  hydrOXYzine (ATARAX/VISTARIL) 25 MG tablet Take 1 tablet (25 mg total) by mouth 3 (three) times daily as needed for anxiety. 08/03/17  Yes Money, Lowry Ram, FNP  Melatonin 5 MG TABS Take 5-10 mg by mouth at bedtime.   Yes [provider]  ondansetron (ZOFRAN) 4 MG tablet Take 1-2 tablets (4-8 mg total) by mouth every 8 (eight) hours as needed for nausea or vomiting. 11/03/17  Yes [name], NP  traZODone (DESYREL) 300 MG tablet Take 1 tablet (300 mg total) by mouth at bedtime. For sleep and mood 08/03/17  Yes Money, Lowry Ram, FNP  ziprasidone (GEODON) 20 MG capsule Take 1 capsule (20 mg total) by mouth 2 (two) times daily with a meal. For mood control 08/03/17  Yes Money, Lowry Ram, FNP    Family History Family History  Problem Relation Age of Onset  . Stroke Other   . Cancer Other   . Hyperlipidemia Mother   . Hypertension Mother     Social History Social History   Tobacco Use  . Smoking status: Former Research scientist (life sciences)  . Smokeless tobacco: Never Used  . Tobacco comment: smoked on and off 16 years- quit 2016  Substance Use Topics  . Alcohol use: Yes    Comment: 08/08/17- No alcohol since 07/26/17; Pt stated as of 11/02/17 "now drinking Listerine"  .  Drug use: No    Frequency: 1.0 times per week    Types: "Crack" cocaine, Cocaine    Comment: 08/08/2017- no cocaine for 1 month     Allergies   Codeine; Penicillins; Morphine; Coconut flavor; Coconut oil; Grapefruit concentrate; Morphine and related; Oxycodone; and Norco [hydrocodone-acetaminophen]   Review of Systems Review of Systems  Constitutional: Negative for appetite change, chills and fever.  HENT: Negative for trouble swallowing and voice change.   Respiratory: Negative for cough, choking, chest tightness, shortness of breath, wheezing and stridor.   Cardiovascular: Negative for chest pain and palpitations.  Gastrointestinal: Positive for abdominal pain. Negative for abdominal distention, blood in stool, diarrhea, nausea and vomiting.  Genitourinary: Negative for decreased urine volume, difficulty urinating, dysuria, flank pain, frequency and hematuria.  Musculoskeletal: Negative for arthralgias, back pain, myalgias, neck pain and neck stiffness.  Skin: Negative for color change, pallor and  rash.  Neurological: Negative for dizziness, syncope, weakness, light-headedness, numbness and headaches.  Psychiatric/Behavioral: Positive for suicidal ideas.       When asked about SI on initial assessment ros he replied " yes, that's why I drank the listerine".     Physical Exam Updated Vital Signs BP 112/81   Pulse 84   Resp (!) 7   SpO2 97%   Physical Exam  Constitutional: He appears well-developed and well-nourished. No distress.  Appearing etoh intoxicated lying comfortably in bed in no acute distress, somnolent  HENT:  Head: Atraumatic.  Mouth/Throat: Oropharynx is clear and moist. No oropharyngeal exudate.  Eyes: Pupils are equal, round, and reactive to light. EOM are normal. Right eye exhibits no discharge. Left eye exhibits no discharge.  Neck: Normal range of motion. Neck supple.  Cardiovascular: Normal rate, regular rhythm, normal heart sounds and intact distal  pulses.  Pulmonary/Chest: Effort normal. No stridor. No respiratory distress. He has no wheezes. He has no rales.  Abdominal: Soft. Bowel sounds are normal. He exhibits no distension and no mass. There is no tenderness. There is no rebound and no guarding.  Musculoskeletal: Normal range of motion. He exhibits no edema or tenderness.  Neurological: He is alert. No cranial nerve deficit or sensory deficit. He exhibits normal muscle tone.  5/5 strength in grips bilaterally. Normal stance and gait, normal balance, no cranial nerve deficits  Skin: Skin is warm and dry. No rash noted. He is not diaphoretic. No erythema. No pallor.  Psychiatric: He has a normal mood and affect. His behavior is normal.  Nursing note and vitals reviewed.    ED Treatments / Results  Labs (all labs ordered are listed, but only abnormal results are displayed) Labs Reviewed  ACETAMINOPHEN LEVEL - Abnormal; Notable for the following components:      Result Value   Acetaminophen (Tylenol), Serum <10 (*)    All other components within normal limits  ETHANOL - Abnormal; Notable for the following components:   Alcohol, Ethyl (B) 415 (*)    All other components within normal limits  URINALYSIS, ROUTINE W REFLEX MICROSCOPIC - Abnormal; Notable for the following components:   Hgb urine dipstick SMALL (*)    All other components within normal limits  CBC WITH DIFFERENTIAL/PLATELET - Abnormal; Notable for the following components:   WBC 3.6 (*)    Platelets 66 (*)    Neutro Abs 1.0 (*)    All other components within normal limits  COMPREHENSIVE METABOLIC PANEL - Abnormal; Notable for the following components:   Calcium 8.1 (*)    AST 469 (*)    ALT 305 (*)    All other components within normal limits  CBG MONITORING, ED - Abnormal; Notable for the following components:   Glucose-Capillary 64 (*)    All other components within normal limits  SALICYLATE LEVEL  LIPASE, BLOOD  RAPID URINE DRUG SCREEN, HOSP PERFORMED    CBG MONITORING, ED    EKG None  Radiology No results found.  Procedures Procedures (including critical care time)  Medications Ordered in ED Medications - No data to display   Initial Impression / Assessment and Plan / ED Course  I have reviewed the triage vital signs and the nursing notes.  Pertinent labs & imaging results that were available during my care of the patient were reviewed by me and considered in my medical decision making (see chart for details).     Patient presented intoxicated after drinking listerine. Hypoglycemia at 64. Glucose trending  up to 76 after eating. Labs with etoh 415, negative urine drug screen. Thrombocytopenia. Negative acetaminophen or salicylate.  Consulted poison control who advise observation for 6 hours mainly for sobriety.  On initial assessment, he was somnolent and poor historian. Answering affirmatively to most ros questions including SI. Patient was observed for hours while in the department and became more alert and answering appropriately. He felt very strongly about leaving to go home and rest before going to work.  Behavioral health familiar with patient advising against IVC. They will evaluate patient if he agrees to stay.  Patient continues to strongly deny any SI. He appears to have decision -making capacity. Asked patient to allow [place] to obtain a repeat cbg to ensure that his hypoglycemia was resolving prior to leaving the department and he agreed.  On reassessment, patient was attempting to leave Patient absolutely denies any suicidal ideation.   Patient was clinically sober, alert and oriented, ambulating around the department with normal balance, normal stance and gait.   Offered assistance with alcohol dependence resources, medications , housing and offered to consult the case manager, but patient is refusing, stating that he has spoken with case management multliple times and has an orange card and can now get his meds.    Will discharge patient home with close follow up with the wellness center.  Discussed strict return precautions and advised to return to the emergency department if experiencing any new or worsening symptoms. Instructions were understood and patient agreed with discharge plan. Patient was discussed with Dr. Rogene Houston who agrees with assessment and plan. Final Clinical Impressions(s) / ED Diagnoses   Final diagnoses:  Hypoglycemia  Alcoholic intoxication without complication [facility])    ED Discharge Orders    None       Dossie Der 11/18/17 [place]on Folk, MD 11/19/17 [phone]

## 2017-11-17 NOTE — ED Notes (Signed)
Pt refusing to speak with TTS, only wants to go home. Pt states he has had thoughts of suicide in the past but does not have any at this time. Sitter bedside. Pt refusing to change out of his street clothes and into burgundy scrubs as well.

## 2017-11-17 NOTE — ED Triage Notes (Signed)
Pt has drank 3- 16oz bottles listerine to get "drunk" states he used cocaine. Pupils pin point and non reactive. Waiting at charge desk to check in he got belligerent 20 left hand that he threatened to pull out. States he thought he called out for abdominal pain. 126/80, 90 hr, resp 16, CBG 95

## 2017-11-17 NOTE — ED Notes (Signed)
Pt wheeled into room from hallway, stating "I have to go to work, I need to go now. I only drank three bottles of listerene mouthwash. I been drinking 2-3 bottles for days now." Pt placed on monitor, room in sight of nurses desk for better visualization.

## 2017-11-17 NOTE — ED Notes (Signed)
CBG rechecked after pt drank apple juice. CBG 74

## 2017-11-17 NOTE — ED Notes (Signed)
Pt stating he wants to go home now, requesting work note. This RN spoke with [place], who did not recommend IVC, BH is familiar with pt. Pt agreeing to stay until blood work results are complete.

## 2017-11-17 NOTE — ED Notes (Signed)
Patient verbalizes understanding of discharge instructions. Opportunity for questioning and answers were provided. Armband removed by staff, pt discharged from ED ambulatory with bus pass.  

## 2017-11-17 NOTE — Discharge Instructions (Signed)
As discussed, make sure that you follow up with [facility] and the wellness center. Discuss alcohol cessation with your primary care provider. I have offered to ask our case manager for assistance with housing and medications. You may return at anytime if you need help and change your mind, worsen or experience any new concerning symptoms in the meantime.

## 2017-11-22 ENCOUNTER — Encounter (HOSPITAL_COMMUNITY): Payer: Self-pay

## 2017-11-22 ENCOUNTER — Inpatient Hospital Stay (HOSPITAL_COMMUNITY)
Admission: EM | Admit: 2017-11-22 | Discharge: 2017-11-27 | DRG: 918 | Disposition: A | Discharge status: Other Acute Inpatient Hospital | Payer: Self-pay | Attending: Internal Medicine | Admitting: Internal Medicine

## 2017-11-22 ENCOUNTER — Emergency Department (HOSPITAL_COMMUNITY): Payer: Self-pay

## 2017-11-22 ENCOUNTER — Other Ambulatory Visit: Payer: Self-pay

## 2017-11-22 DIAGNOSIS — Y929 Unspecified place or not applicable: Secondary | ICD-10-CM

## 2017-11-22 DIAGNOSIS — Z85528 Personal history of other malignant neoplasm of kidney: Secondary | ICD-10-CM

## 2017-11-22 DIAGNOSIS — F4310 Post-traumatic stress disorder, unspecified: Secondary | ICD-10-CM | POA: Diagnosis present

## 2017-11-22 DIAGNOSIS — T50992A Poisoning by other drugs, medicaments and biological substances, intentional self-harm, initial encounter: Secondary | ICD-10-CM | POA: Diagnosis present

## 2017-11-22 DIAGNOSIS — T50902A Poisoning by unspecified drugs, medicaments and biological substances, intentional self-harm, initial encounter: Secondary | ICD-10-CM

## 2017-11-22 DIAGNOSIS — Z87891 Personal history of nicotine dependence: Secondary | ICD-10-CM

## 2017-11-22 DIAGNOSIS — E785 Hyperlipidemia, unspecified: Secondary | ICD-10-CM | POA: Diagnosis present

## 2017-11-22 DIAGNOSIS — R1013 Epigastric pain: Secondary | ICD-10-CM

## 2017-11-22 DIAGNOSIS — E7800 Pure hypercholesterolemia, unspecified: Secondary | ICD-10-CM | POA: Diagnosis present

## 2017-11-22 DIAGNOSIS — R195 Other fecal abnormalities: Secondary | ICD-10-CM | POA: Diagnosis present

## 2017-11-22 DIAGNOSIS — Z91018 Allergy to other foods: Secondary | ICD-10-CM

## 2017-11-22 DIAGNOSIS — I10 Essential (primary) hypertension: Secondary | ICD-10-CM | POA: Diagnosis present

## 2017-11-22 DIAGNOSIS — N2889 Other specified disorders of kidney and ureter: Secondary | ICD-10-CM | POA: Diagnosis present

## 2017-11-22 DIAGNOSIS — I739 Peripheral vascular disease, unspecified: Secondary | ICD-10-CM | POA: Diagnosis present

## 2017-11-22 DIAGNOSIS — D6959 Other secondary thrombocytopenia: Secondary | ICD-10-CM | POA: Diagnosis present

## 2017-11-22 DIAGNOSIS — I2510 Atherosclerotic heart disease of native coronary artery without angina pectoris: Secondary | ICD-10-CM | POA: Diagnosis present

## 2017-11-22 DIAGNOSIS — K7460 Unspecified cirrhosis of liver: Secondary | ICD-10-CM | POA: Diagnosis present

## 2017-11-22 DIAGNOSIS — E162 Hypoglycemia, unspecified: Secondary | ICD-10-CM | POA: Diagnosis present

## 2017-11-22 DIAGNOSIS — Z823 Family history of stroke: Secondary | ICD-10-CM

## 2017-11-22 DIAGNOSIS — R112 Nausea with vomiting, unspecified: Secondary | ICD-10-CM

## 2017-11-22 DIAGNOSIS — R945 Abnormal results of liver function studies: Secondary | ICD-10-CM

## 2017-11-22 DIAGNOSIS — F10929 Alcohol use, unspecified with intoxication, unspecified: Secondary | ICD-10-CM

## 2017-11-22 DIAGNOSIS — F339 Major depressive disorder, recurrent, unspecified: Secondary | ICD-10-CM | POA: Diagnosis present

## 2017-11-22 DIAGNOSIS — C649 Malignant neoplasm of unspecified kidney, except renal pelvis: Secondary | ICD-10-CM | POA: Diagnosis present

## 2017-11-22 DIAGNOSIS — Z915 Personal history of self-harm: Secondary | ICD-10-CM

## 2017-11-22 DIAGNOSIS — E876 Hypokalemia: Secondary | ICD-10-CM | POA: Diagnosis present

## 2017-11-22 DIAGNOSIS — R45851 Suicidal ideations: Secondary | ICD-10-CM

## 2017-11-22 DIAGNOSIS — F10239 Alcohol dependence with withdrawal, unspecified: Secondary | ICD-10-CM | POA: Diagnosis present

## 2017-11-22 DIAGNOSIS — T43212A Poisoning by selective serotonin and norepinephrine reuptake inhibitors, intentional self-harm, initial encounter: Principal | ICD-10-CM | POA: Diagnosis present

## 2017-11-22 DIAGNOSIS — F1910 Other psychoactive substance abuse, uncomplicated: Secondary | ICD-10-CM | POA: Diagnosis present

## 2017-11-22 DIAGNOSIS — R000 Tachycardia, unspecified: Secondary | ICD-10-CM | POA: Diagnosis present

## 2017-11-22 DIAGNOSIS — R7989 Other specified abnormal findings of blood chemistry: Secondary | ICD-10-CM

## 2017-11-22 DIAGNOSIS — K760 Fatty (change of) liver, not elsewhere classified: Secondary | ICD-10-CM | POA: Diagnosis present

## 2017-11-22 DIAGNOSIS — F1414 Cocaine abuse with cocaine-induced mood disorder: Secondary | ICD-10-CM | POA: Diagnosis present

## 2017-11-22 DIAGNOSIS — Z8249 Family history of ischemic heart disease and other diseases of the circulatory system: Secondary | ICD-10-CM

## 2017-11-22 DIAGNOSIS — G4700 Insomnia, unspecified: Secondary | ICD-10-CM | POA: Diagnosis present

## 2017-11-22 DIAGNOSIS — Z885 Allergy status to narcotic agent status: Secondary | ICD-10-CM

## 2017-11-22 DIAGNOSIS — Z8349 Family history of other endocrine, nutritional and metabolic diseases: Secondary | ICD-10-CM

## 2017-11-22 DIAGNOSIS — Z853 Personal history of malignant neoplasm of breast: Secondary | ICD-10-CM

## 2017-11-22 DIAGNOSIS — F1420 Cocaine dependence, uncomplicated: Secondary | ICD-10-CM | POA: Diagnosis present

## 2017-11-22 DIAGNOSIS — Z880 Allergy status to penicillin: Secondary | ICD-10-CM

## 2017-11-22 DIAGNOSIS — F1424 Cocaine dependence with cocaine-induced mood disorder: Secondary | ICD-10-CM | POA: Diagnosis present

## 2017-11-22 DIAGNOSIS — F209 Schizophrenia, unspecified: Secondary | ICD-10-CM | POA: Diagnosis present

## 2017-11-22 DIAGNOSIS — F1010 Alcohol abuse, uncomplicated: Secondary | ICD-10-CM | POA: Diagnosis present

## 2017-11-22 DIAGNOSIS — F1090 Alcohol use, unspecified, uncomplicated: Secondary | ICD-10-CM | POA: Diagnosis present

## 2017-11-22 DIAGNOSIS — T50901A Poisoning by unspecified drugs, medicaments and biological substances, accidental (unintentional), initial encounter: Secondary | ICD-10-CM | POA: Diagnosis present

## 2017-11-22 DIAGNOSIS — D72819 Decreased white blood cell count, unspecified: Secondary | ICD-10-CM | POA: Diagnosis present

## 2017-11-22 DIAGNOSIS — Z905 Acquired absence of kidney: Secondary | ICD-10-CM

## 2017-11-22 DIAGNOSIS — Z79899 Other long term (current) drug therapy: Secondary | ICD-10-CM

## 2017-11-22 DIAGNOSIS — Y906 Blood alcohol level of 120-199 mg/100 ml: Secondary | ICD-10-CM | POA: Diagnosis present

## 2017-11-22 DIAGNOSIS — F332 Major depressive disorder, recurrent severe without psychotic features: Secondary | ICD-10-CM | POA: Diagnosis present

## 2017-11-22 DIAGNOSIS — F10939 Alcohol use, unspecified with withdrawal, unspecified: Secondary | ICD-10-CM | POA: Diagnosis present

## 2017-11-22 DIAGNOSIS — F410 Panic disorder [episodic paroxysmal anxiety] without agoraphobia: Secondary | ICD-10-CM | POA: Diagnosis present

## 2017-11-22 DIAGNOSIS — Z590 Homelessness: Secondary | ICD-10-CM

## 2017-11-22 DIAGNOSIS — Z9013 Acquired absence of bilateral breasts and nipples: Secondary | ICD-10-CM

## 2017-11-22 DIAGNOSIS — F10229 Alcohol dependence with intoxication, unspecified: Secondary | ICD-10-CM | POA: Diagnosis present

## 2017-11-22 LAB — CBG MONITORING, ED
GLUCOSE-CAPILLARY: 108 mg/dL — AB (ref 65–99)
GLUCOSE-CAPILLARY: 96 mg/dL (ref 65–99)
Glucose-Capillary: 73 mg/dL (ref 65–99)
Glucose-Capillary: 69 mg/dL (ref 65–99)
Glucose-Capillary: 85 mg/dL (ref 65–99)
Glucose-Capillary: 109 mg/dL — ABNORMAL HIGH (ref 65–99)
Glucose-Capillary: 89 mg/dL (ref 65–99)
Glucose-Capillary: 91 mg/dL (ref 65–99)
Glucose-Capillary: 60 mg/dL — ABNORMAL LOW (ref 65–99)
Glucose-Capillary: 128 mg/dL — ABNORMAL HIGH (ref 65–99)
Glucose-Capillary: 123 mg/dL — ABNORMAL HIGH (ref 65–99)
Glucose-Capillary: 120 mg/dL — ABNORMAL HIGH (ref 65–99)
Glucose-Capillary: 110 mg/dL — ABNORMAL HIGH (ref 65–99)

## 2017-11-22 LAB — COMPREHENSIVE METABOLIC PANEL
ALBUMIN: 4.1 g/dL (ref 3.5–5.0)
ALK PHOS: 84 U/L (ref 38–126)
ALT: 301 U/L — ABNORMAL HIGH (ref 17–63)
ANION GAP: 19 — AB (ref 5–15)
AST: 369 U/L — ABNORMAL HIGH (ref 15–41)
BILIRUBIN TOTAL: 1.2 mg/dL (ref 0.3–1.2)
BUN: 8 mg/dL (ref 6–20)
CALCIUM: 8.9 mg/dL (ref 8.9–10.3)
CO2: 22 mmol/L (ref 22–32)
Chloride: 95 mmol/L — ABNORMAL LOW (ref 101–111)
Creatinine, Ser: 0.83 mg/dL (ref 0.61–1.24)
Glucose, Bld: 78 mg/dL (ref 65–99)
POTASSIUM: 3.9 mmol/L (ref 3.5–5.1)
Sodium: 136 mmol/L (ref 135–145)
TOTAL PROTEIN: 7.7 g/dL (ref 6.5–8.1)

## 2017-11-22 LAB — URINALYSIS, ROUTINE W REFLEX MICROSCOPIC
Bacteria, UA: NONE SEEN
GLUCOSE, UA: NEGATIVE mg/dL
Ketones, ur: 80 mg/dL — AB
Leukocytes, UA: NEGATIVE
NITRITE: NEGATIVE
PH: 6 (ref 5.0–8.0)
Protein, ur: 100 mg/dL — AB
Specific Gravity, Urine: 1.024 (ref 1.005–1.030)

## 2017-11-22 LAB — BASIC METABOLIC PANEL
ANION GAP: 16 — AB (ref 5–15)
BUN: 8 mg/dL (ref 6–20)
CALCIUM: 8.4 mg/dL — AB (ref 8.9–10.3)
CO2: 21 mmol/L — ABNORMAL LOW (ref 22–32)
Chloride: 99 mmol/L — ABNORMAL LOW (ref 101–111)
Creatinine, Ser: 0.82 mg/dL (ref 0.61–1.24)
Glucose, Bld: 112 mg/dL — ABNORMAL HIGH (ref 65–99)
Potassium: 4.1 mmol/L (ref 3.5–5.1)
SODIUM: 136 mmol/L (ref 135–145)

## 2017-11-22 LAB — CBC
HEMATOCRIT: 39.5 % (ref 39.0–52.0)
Hemoglobin: 14.1 g/dL (ref 13.0–17.0)
MCH: 30.2 pg (ref 26.0–34.0)
MCHC: 35.7 g/dL (ref 30.0–36.0)
MCV: 84.6 fL (ref 78.0–100.0)
PLATELETS: 50 10*3/uL — AB (ref 150–400)
RBC: 4.67 MIL/uL (ref 4.22–5.81)
RDW: 15.2 % (ref 11.5–15.5)
WBC: 3.6 10*3/uL — AB (ref 4.0–10.5)

## 2017-11-22 LAB — RAPID URINE DRUG SCREEN, HOSP PERFORMED
Amphetamines: NOT DETECTED
BARBITURATES: POSITIVE — AB
Benzodiazepines: NOT DETECTED
Cocaine: POSITIVE — AB
Opiates: NOT DETECTED
Tetrahydrocannabinol: NOT DETECTED

## 2017-11-22 LAB — LIPASE, BLOOD: Lipase: 21 U/L (ref 11–51)

## 2017-11-22 LAB — ETHANOL: ALCOHOL ETHYL (B): 120 mg/dL — AB (ref <10)

## 2017-11-22 LAB — ACETAMINOPHEN LEVEL
Acetaminophen (Tylenol), Serum: <10 ug/mL — ABNORMAL LOW (ref 10–30)
Acetaminophen (Tylenol), Serum: <10 ug/mL — ABNORMAL LOW (ref 10–30)

## 2017-11-22 LAB — POC OCCULT BLOOD, ED: FECAL OCCULT BLD: NEGATIVE

## 2017-11-22 LAB — SALICYLATE LEVEL

## 2017-11-22 MED ORDER — GI COCKTAIL ~~LOC~~
30.0000 mL | Freq: Once | ORAL | Status: AC
  Administered 2017-11-22: 30 mL via ORAL
  Filled 2017-11-22: qty 30

## 2017-11-22 MED ORDER — SODIUM CHLORIDE 0.9 % IV BOLUS
1000.0000 mL | Freq: Once | INTRAVENOUS | Status: AC
  Administered 2017-11-22: 1000 mL via INTRAVENOUS

## 2017-11-22 MED ORDER — SODIUM CHLORIDE 0.9 % IV BOLUS
500.0000 mL | Freq: Once | INTRAVENOUS | Status: AC
  Administered 2017-11-22: 500 mL via INTRAVENOUS

## 2017-11-22 MED ORDER — PANTOPRAZOLE SODIUM 40 MG PO TBEC
40.0000 mg | DELAYED_RELEASE_TABLET | Freq: Every day | ORAL | Status: DC
  Administered 2017-11-22 – 2017-11-27 (×6): 40 mg via ORAL
  Filled 2017-11-22 (×6): qty 1

## 2017-11-22 MED ORDER — FENTANYL CITRATE (PF) 100 MCG/2ML IJ SOLN: 50.0000 ug | Freq: Once | INTRAMUSCULAR | Status: DC

## 2017-11-22 MED ORDER — MELATONIN 5 MG PO TABS
5.0000 mg | ORAL_TABLET | Freq: Every day | ORAL | Status: DC
  Administered 2017-11-22 – 2017-11-26 (×5): 5 mg via ORAL
  Filled 2017-11-22 (×6): qty 1

## 2017-11-22 MED ORDER — THIAMINE HCL 100 MG/ML IJ SOLN
100.0000 mg | Freq: Once | INTRAMUSCULAR | Status: AC
  Administered 2017-11-22: 100 mg via INTRAVENOUS
  Filled 2017-11-22: qty 2

## 2017-11-22 MED ORDER — DEXTROSE 5 % IV SOLN
Freq: Once | INTRAVENOUS | Status: AC
  Administered 2017-11-22: 12:00:00 via INTRAVENOUS

## 2017-11-22 MED ORDER — LORAZEPAM 1 MG PO TABS
1.0000 mg | ORAL_TABLET | Freq: Once | ORAL | Status: AC
  Administered 2017-11-22: 1 mg via ORAL
  Filled 2017-11-22: qty 1

## 2017-11-22 MED ORDER — HALOPERIDOL LACTATE 5 MG/ML IJ SOLN
2.0000 mg | Freq: Once | INTRAMUSCULAR | Status: AC
  Administered 2017-11-22: 2 mg via INTRAVENOUS
  Filled 2017-11-22: qty 1

## 2017-11-22 MED ORDER — FAMOTIDINE IN NACL 20-0.9 MG/50ML-% IV SOLN
20.0000 mg | Freq: Once | INTRAVENOUS | Status: AC
  Administered 2017-11-22: 20 mg via INTRAVENOUS
  Filled 2017-11-22: qty 50

## 2017-11-22 MED ORDER — DEXTROSE 5 % IV SOLN: Freq: Once | INTRAVENOUS | Status: DC

## 2017-11-22 MED ORDER — AMLODIPINE BESYLATE 5 MG PO TABS
5.0000 mg | ORAL_TABLET | Freq: Every day | ORAL | Status: DC
  Administered 2017-11-22 – 2017-11-27 (×6): 5 mg via ORAL
  Filled 2017-11-22 (×6): qty 1

## 2017-11-22 MED ORDER — ONDANSETRON HCL 4 MG/2ML IJ SOLN
4.0000 mg | Freq: Once | INTRAMUSCULAR | Status: AC
  Administered 2017-11-22: 4 mg via INTRAVENOUS
  Filled 2017-11-22: qty 2

## 2017-11-22 MED ORDER — ADULT MULTIVITAMIN W/MINERALS CH: 1.0000 | ORAL_TABLET | Freq: Once | ORAL | Status: DC

## 2017-11-22 NOTE — ED Notes (Signed)
CBG 69

## 2017-11-22 NOTE — ED Notes (Signed)
Bed: BB40 Expected date:  Expected time:  Means of arrival:  Comments: EMS Overdose Trazadone/ETOH

## 2017-11-22 NOTE — ED Notes (Signed)
Spoke with [employer] from Poison Control: -Listerine: -GI bleed -Fluid/Electrolyte imbalance -CNS depression -Vomiting Trazadone: -CNS depression -obtain EKG and monitor for conduction delays -repeat EKG in 3-4 hours -4 hour Tylenol level -Monitor for 6 hours

## 2017-11-22 NOTE — ED Notes (Signed)
[facility] Provider at bedside.

## 2017-11-22 NOTE — ED Notes (Signed)
ADMITTING Provider at bedside. 
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
## 2017-11-22 NOTE — Care Management (Signed)
EDCM reviewed CM consult. Patient is being admitted. Unit CM to follow for discharge medication needs. Venita Sheffield RN CCM

## 2017-11-22 NOTE — H&P (Signed)
History and Physical  [name] IRJ:[id number] DOB: 22-Feb-1981 DOA: 11/22/2017  Referring physician: EDP PCP: Patient, No Pcp Per  Outpatient Specialists:  Patient coming from: Motel & is able to ambulate independently  Chief Complaint: Trazodone overdose  HPI: [name] is a 37 y.o. male with medical history significant for polysubstance abuse (alcohol, cocaine, tobacco), PVD, HLD, history of breast CA, renal CA, PTSD, depression with history of multiple suicide attempts presents to the ED complaining of intractable nausea/vomiting, epigastric pain after an attempt to commit suicide by consuming about 15 pills of 100 mg trazodone and large amounts of Listerine.  Patient has been following up with [facility], but was currently staying in a motel as he is homeless.  Patient reports drinking a whole bottle of wine last night, and does crack cocaine occasionally, at least once every month.  Patient reports mild epigastric tenderness, currently denies any nausea/vomiting, denies chest pain, shortness of breath, fever/chills, cough, dysuria, diarrhea, dizziness.  ED Course: Patient was noted to be tachycardic, ethanol level was 120, with elevated liver enzymes.  Poison control was called and they recommended observing patient in watching out for CNS depression.  EKG showed normal sinus rhythm, with QT prolongation 499.  Due to all his symptoms, patient will be admitted to SDU for closer observation  Review of Systems: Review of systems are otherwise negative   Past Medical History:  Diagnosis Date  . Angina   . Anxiety    panic attack  . Bipolar 1 disorder ([place])   . Breast CA ([place]) dx'd 2009   bil w/ bil masectomy and oral meds  . Cancer [facility])    kidney cancer  . Coronary artery disease   . Depression   . H/O suicide attempt 2015   overdose  . Headache(784.0)   . Hypercholesteremia   . Hypertension   . Liver cirrhosis ([place])   . Pancreatitis   . Peripheral vascular disease [facility]) April  2011   Left Pop  . Schizophrenia ([place])   . Seizures ([place])    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  . Shortness of breath    Past Surgical History:  Procedure Laterality Date  . BREAST SURGERY    . BREAST SURGERY     bilateral breast silocone  removal  . CHEST SURGERY    . left kidney removal    . left leg surgery     "popiteal artery clogged"  . MASTECTOMY Bilateral   . NEPHRECTOMY Left   . ORIF CLAVICULAR FRACTURE Left 08/10/2017   Procedure: OPEN REDUCTION INTERNAL FIXATION (ORIF) LEFT CLAVICLE FRACTURE WITH RECONSTRUCTION OF CORACOCLAVICULAR LIGAMENT;  Surgeon: Leandrew Koyanagi, MD;  Location: [place];  Service: Orthopedics;  Laterality: Left;  . RECONSTRUCTION OF CORACOCLAVICULAR LIGAMENT Left 08/10/2017   Procedure: RECONSTRUCTION OF CORACOCLAVICULAR LIGAMENT;  Surgeon: Leandrew Koyanagi, MD;  Location: [place];  Service: Orthopedics;  Laterality: Left;    Social History:  reports that he has quit smoking. He has never used smokeless tobacco. He reports that he drinks alcohol. He reports that he does not use drugs.   Allergies  Allergen Reactions  . Codeine Hives, Itching, Swelling and Other (See Comments)    Does not impair breathing, however  . Penicillins Swelling and Other (See Comments)    Has patient had a PCN reaction causing immediate rash, facial/tongue/throat swelling, SOB or lightheadedness with hypotension: Yes Has patient had a PCN reaction causing severe rash involving mucus membranes or skin necrosis: Yes Has patient had a PCN  reaction that required hospitalization Yes-ed visit Has patient had a PCN reaction occurring within the last 10 years: Yes If all of the above answers are "NO", then may proceed with Cephalosporin use.   Marland Kitchen Morphine Itching  . Coconut Flavor Itching and Swelling  . Coconut Oil Other (See Comments)    Cannot take with some of his meds  . Grapefruit Concentrate Other (See Comments)    Cannot take with some of his meds  . Morphine And Related Itching  and Swelling  . Oxycodone Itching and Swelling  . Norco [Hydrocodone-Acetaminophen] Itching and Rash    Family History  Problem Relation Age of Onset  . Stroke Other   . Cancer Other   . Hyperlipidemia Mother   . Hypertension Mother       Prior to Admission medications   Medication Sig Start Date End Date Taking? Authorizing Provider  amLODipine (NORVASC) 5 MG tablet Take 1 tablet (5 mg total) by mouth daily. For high blood pressure Patient taking differently: Take 5 mg by mouth 2 (two) times daily. For high blood pressure 08/04/17  Yes Money, Lowry Ram, FNP  atorvastatin (LIPITOR) 20 MG tablet Take 1 tablet (20 mg total) by mouth daily at 6 PM. For high cholesterol 08/03/17  Yes Money, Lowry Ram, FNP  gabapentin (NEURONTIN) 300 MG capsule Take 1 capsule (300 mg total) by mouth 3 (three) times daily. 11/03/17  Yes [name], NP  hydrOXYzine (ATARAX/VISTARIL) 25 MG tablet Take 1 tablet (25 mg total) by mouth 3 (three) times daily as needed for anxiety. 08/03/17  Yes Money, Lowry Ram, FNP  Melatonin 5 MG TABS Take 5-10 mg by mouth at bedtime.   Yes [provider]  ondansetron (ZOFRAN) 4 MG tablet Take 1-2 tablets (4-8 mg total) by mouth every 8 (eight) hours as needed for nausea or vomiting. 11/03/17  Yes [name], NP  traZODone (DESYREL) 300 MG tablet Take 1 tablet (300 mg total) by mouth at bedtime. For sleep and mood 08/03/17  Yes Money, Lowry Ram, FNP  ziprasidone (GEODON) 20 MG capsule Take 1 capsule (20 mg total) by mouth 2 (two) times daily with a meal. For mood control 08/03/17  Yes Money, Lowry Ram, FNP    Physical Exam: BP (!) 136/93   Pulse 95   Temp 98.3 F (36.8 C) (Oral)   Resp 18   Ht 5\' 6"  (1.676 m)   Wt 68 kg (150 lb)   SpO2 98%   BMI 24.21 kg/m   General: NAD Eyes: Pupils are equal, round and reactive to light ENT: Normal Neck: Supple Cardiovascular: S1, S2 present Respiratory: CTAB Abdomen: Soft, mild tenderness in the epigastric region,  nondistended, bowel sounds present Skin: Normal Musculoskeletal: No pedal edema bilaterally Psychiatric: Fair mood Neurologic: No focal neurologic deficit          Labs on Admission:  Basic Metabolic Panel: Recent Labs  Lab 11/17/17 1617 11/22/17 0706 11/22/17 1405  NA 140 136 136  K 4.6 3.9 4.1  CL 103 95* 99*  CO2 26 22 21*  GLUCOSE 74 78 112*  BUN 8 8 8   CREATININE 0.86 0.83 0.82  CALCIUM 8.1* 8.9 8.4*   Liver Function Tests: Recent Labs  Lab 11/17/17 1617 11/22/17 0706  AST 469* 369*  ALT 305* 301*  ALKPHOS 70 84  BILITOT 0.8 1.2  PROT 6.6 7.7  ALBUMIN 3.5 4.1   Recent Labs  Lab 11/17/17 1617 11/22/17 0706  LIPASE 37 21  No results for input(s): AMMONIA in the last 168 hours. CBC: Recent Labs  Lab 11/17/17 1617 11/22/17 0706  WBC 3.6* 3.6*  NEUTROABS 1.0*  --   HGB 14.2 14.1  HCT 41.4 39.5  MCV 85.9 84.6  PLT 66* 50*   Cardiac Enzymes: No results for input(s): CKTOTAL, CKMB, CKMBINDEX, TROPONINI in the last 168 hours.  BNP (last 3 results) No results for input(s): BNP in the last 8760 hours.  ProBNP (last 3 results) No results for input(s): PROBNP in the last 8760 hours.  CBG: Recent Labs  Lab 11/22/17 1500 11/22/17 1617 11/22/17 1719 11/22/17 1802 11/22/17 1915  GLUCAP 89 91 60* 128* 123*    Radiological Exams on Admission: Dg Abdomen Acute W/chest  Result Date: 11/22/2017 CLINICAL DATA:  Overdose today. EXAM: DG ABDOMEN ACUTE W/ 1V CHEST COMPARISON:  None. FINDINGS: Single-view of the chest demonstrates clear lungs and normal heart size. No pneumothorax or pleural effusion. Postoperative change of repair of left AC joint separation noted. Two views of the abdomen show normal bowel gas pattern. No free intraperitoneal air. No unexpected abdominal calcification or focal bony abnormality. IMPRESSION: No acute abnormality chest or abdomen. Electronically Signed   By: Inge Rise M.D.   On: 11/22/2017 12:59    EKG: Independently  reviewed.  Normal sinus rhythm, with QT prolongation  Assessment/Plan Present on Admission: . Overdose . HYPERCHOLESTEROLEMIA . Essential hypertension . Alcohol abuse . Episode of recurrent major depressive disorder ([place]) . Left kidney mass . Cocaine dependence ([place]) . Polysubstance abuse ([place]) . Alcohol withdrawal ([place]) . Severe episode of recurrent major depressive disorder, without psychotic features ([place])  Principal Problem:   Overdose Active Problems:   HYPERCHOLESTEROLEMIA   Essential hypertension   Alcohol abuse   Episode of recurrent major depressive disorder ([facility])   Left kidney mass   Cocaine dependence ([place])   Polysubstance abuse ([place])   Suicidal ideation   Alcohol withdrawal ([facility])   Severe episode of recurrent major depressive disorder, without psychotic features ([place])  Overdose with trazodone/suicide attempt Recurrent attempts with multiple hospitalization for similar issues EKG with prolonged QTC Poison control contacted in the ED, recommended to observe for CNS depression Hold home trazodone Avoid QTC prolongation medications Once medically stable, should be transferred to [facility] for further management Safety sitter Monitoring SDU  Alcohol dependence/withdrawal Alcohol level elevated at 120, last drink was last night, a bottle of wine Noted transaminitis, normal lipase CT abdomen pelvis done on 10/20/2017 showed fatty liver CIWA protocol  Polysubstance abuse UDS positive for cocaine Tylenol and salicylate level normal Monitor closely  Hypertension Continue home meds  HLD/PVD  s/p L popliteal artery stent.  Hold Lipitor due to transaminitis  History of thrombocytopenia ?  Due to alcohol Vs liver disease No signs of bleeding, FOBT negative Monitor closely  Depression/anxiety/insomnia Hold all Home meds, hydroxyzine, trazodone, gabapentin, ziprasidone May restart gradually in the a.m. to prevent withdrawal Restarted melatonin  H/o cancer H/o  kidney and breast cancer s/p mastectomy, and kidney removal, and chemotherapy Continue follow up annually    DVT prophylaxis: SCDs  Code Status: Full  Family Communication: None at bedside  Disposition Plan: [facility], once medically stable  Consults called: None  Admission status: Inpatient    Alma Friendly MD [facility]   If 7PM-7AM, please contact night-coverage [email or web address] Password [email or web address]  11/22/2017, 7:30 PM

## 2017-11-22 NOTE — ED Provider Notes (Signed)
  Face-to-face evaluation   History: He presents for evaluation of homelessness, alcoholism, trazodone overdose, black stools, epigastric and chest pain.  He states he was evicted from his apartment.  Physical exam: Alert, calm, cooperative.  No respiratory distress.  Mild epigastric and anterior chest tenderness.  Medical screening examination/treatment/procedure(s) were conducted as a shared visit with non-physician practitioner(s) and myself.  I personally evaluated the patient during the encounter     Daleen Bo, MD 11/23/17 1415

## 2017-11-22 NOTE — ED Notes (Signed)
TTS AT BEDSIDE 

## 2017-11-22 NOTE — ED Notes (Signed)
PO FLUIDS GIVEN

## 2017-11-22 NOTE — ED Notes (Signed)
PT AWARE OF NEED FOR URINE SAMPLE. PT WALKING TO BATHROOM WITH URINAL

## 2017-11-22 NOTE — ED Notes (Signed)
EDPA Provider at bedside. 

## 2017-11-22 NOTE — ED Notes (Signed)
Placed pt belonging in cabinent labeled 16-18

## 2017-11-22 NOTE — ED Provider Notes (Signed)
[facility] DEPT Provider Note   CSN: [id number] Arrival date & time: 11/22/17  9528     History   Chief Complaint Chief Complaint  Patient presents with  . Drug Overdose  . Alcohol Intoxication    HPI [name] is a 37 y.o. male.  HPI 37 year old African-American male past medical history significant for anxiety, bipolar disorder, depression, prior suicide attempt, hypertension, liver cirrhosis, schizophrenia that presents to the emergency department today that presents to the emergency department today after ingesting Listerine and trazodone.  Patient does have a history of Listerine ingestion. He notes that since that time he has been having upper abdominal pain along with nausea and has had 3 episodes of nonbilious, nonbloody emesis.  Patient reports he has not drink any other alcohol (including rubbing alcohol or other types) or use any drugs other than reported above.  He has not been taking his home medications.  He denies any homicidal ideation.  No fever, chills, chest pain, shortness of breath or other symptoms at this time.    She also states that he took trazodone approximately 15 100 mg tabs at 05 100 today and attempt to hurt himself.  Patient states he has been off his medications has been hearing voices to harm himself.  Denies any HI or auditory visual hallucinations.  Patient states that he is homeless and is living in a hotel room right now and is not able to afford.  Patient also reports some black tarry stools over the past several days.  Denies any bright red blood per rectum.  Pt denies any fever, chill, ha, vision changes, lightheadedness, dizziness, congestion, neck pain, cp, sob, cough,  urinary symptoms, change in bowel habits, melena, hematochezia, lower extremity paresthesias.   Past Medical History:  Diagnosis Date  . Angina   . Anxiety    panic attack  . Bipolar 1 disorder ([place])   . Breast CA ([place]) dx'd 2009   bil w/ bil  masectomy and oral meds  . Cancer [facility])    kidney cancer  . Coronary artery disease   . Depression   . H/O suicide attempt 2015   overdose  . Headache(784.0)   . Hypercholesteremia   . Hypertension   . Liver cirrhosis ([place])   . Pancreatitis   . Peripheral vascular disease [facility]) April 2011   Left Pop  . Schizophrenia ([place])   . Seizures ([place])    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  . Shortness of breath     Patient Active Problem List   Diagnosis Date Noted  . Emesis 10/20/2017  . Displaced fracture of lateral end of left clavicle, initial encounter for closed fracture 08/05/2017  . Coracoclavicular (ligament) sprain and strain, left, initial encounter 08/05/2017  . Closed dislocation of acromioclavicular joint, initial encounter 08/05/2017  . Insomnia   . Hypertension   . Severe episode of recurrent major depressive disorder, without psychotic features ([place])   . Depression, major, severe recurrence ([place]) 12/30/2015  . Substance induced mood disorder ([place]) 12/02/2015  . Mood disorder in conditions classified elsewhere   . Malnutrition of moderate degree 09/24/2015  . Alcohol withdrawal ([place]) 09/23/2015  . Tobacco use disorder 07/16/2015  . Elevated transaminase level   . Drug overdose, intentional ([place]) 07/12/2015  . Alcohol use disorder, severe, dependence ([place]) 04/15/2015  . Cocaine abuse with cocaine-induced mood disorder ([place]) 04/11/2015  . Overdose 04/10/2015  . Severe recurrent major depressive disorder with psychotic features ([place])   . Severe major  depression with psychotic features ([place]) 09/11/2014  . Alcohol-induced mood disorder ([place]) 09/10/2014  . Suicidal ideation   . Tylenol overdose   . Polysubstance abuse ([place])   . Overdose of acetaminophen 08/03/2014  . Overdose by acetaminophen 08/03/2014  . Cocaine abuse ([place]) 04/16/2014  . Thrombocytopenia ([place]) 04/15/2014  . Urinary tract infection, site not specified 04/15/2014  . Chest pain, unspecified 04/15/2014    . D-dimer, elevated 04/15/2014  . Transaminitis 09/24/2013  . Cocaine dependence ([place]) 09/20/2013  . S/p nephrectomy 04/28/2013  . Seizure ([name]) 03/15/2013  . Syncope 01/02/2013  . Leukocytopenia, unspecified 01/02/2013  . Left kidney mass 12/24/2012  . PTSD (post-traumatic stress disorder) 07/06/2012  . Episode of recurrent major depressive disorder ([place]) 06/29/2012    Class: Acute  . Peripheral vascular disease ([place]) 01/14/2012  . Alcohol abuse 10/13/2011  . SEIZURE DISORDER 10/03/2008  . Elevated LFTs 12/29/2007  . HYPERCHOLESTEROLEMIA 03/21/2007  . Essential hypertension 03/21/2007    Past Surgical History:  Procedure Laterality Date  . BREAST SURGERY    . BREAST SURGERY     bilateral breast silocone  removal  . CHEST SURGERY    . left kidney removal    . left leg surgery     "popiteal artery clogged"  . MASTECTOMY Bilateral   . NEPHRECTOMY Left   . ORIF CLAVICULAR FRACTURE Left 08/10/2017   Procedure: OPEN REDUCTION INTERNAL FIXATION (ORIF) LEFT CLAVICLE FRACTURE WITH RECONSTRUCTION OF CORACOCLAVICULAR LIGAMENT;  Surgeon: Leandrew Koyanagi, MD;  Location: [facility];  Service: Orthopedics;  Laterality: Left;  . RECONSTRUCTION OF CORACOCLAVICULAR LIGAMENT Left 08/10/2017   Procedure: RECONSTRUCTION OF CORACOCLAVICULAR LIGAMENT;  Surgeon: Leandrew Koyanagi, MD;  Location: [facility];  Service: Orthopedics;  Laterality: Left;        Home Medications    Prior to Admission medications   Medication Sig Start Date End Date Taking? Authorizing Provider  amLODipine (NORVASC) 5 MG tablet Take 1 tablet (5 mg total) by mouth daily. For high blood pressure Patient taking differently: Take 5 mg by mouth 2 (two) times daily. For high blood pressure 08/04/17  Yes Money, Lowry Ram, FNP  atorvastatin (LIPITOR) 20 MG tablet Take 1 tablet (20 mg total) by mouth daily at 6 PM. For high cholesterol 08/03/17  Yes Money, Lowry Ram, FNP  gabapentin (NEURONTIN) 300 MG capsule Take 1 capsule (300 mg total) by  mouth 3 (three) times daily. 11/03/17  Yes [name], NP  hydrOXYzine (ATARAX/VISTARIL) 25 MG tablet Take 1 tablet (25 mg total) by mouth 3 (three) times daily as needed for anxiety. 08/03/17  Yes Money, Lowry Ram, FNP  Melatonin 5 MG TABS Take 5-10 mg by mouth at bedtime.   Yes [provider]  ondansetron (ZOFRAN) 4 MG tablet Take 1-2 tablets (4-8 mg total) by mouth every 8 (eight) hours as needed for nausea or vomiting. 11/03/17  Yes [name], NP  traZODone (DESYREL) 300 MG tablet Take 1 tablet (300 mg total) by mouth at bedtime. For sleep and mood 08/03/17  Yes Money, Lowry Ram, FNP  ziprasidone (GEODON) 20 MG capsule Take 1 capsule (20 mg total) by mouth 2 (two) times daily with a meal. For mood control 08/03/17  Yes Money, Lowry Ram, FNP    Family History Family History  Problem Relation Age of Onset  . Stroke Other   . Cancer Other   . Hyperlipidemia Mother   . Hypertension Mother     Social History Social History   Tobacco Use  .  Smoking status: Former Research scientist (life sciences)  . Smokeless tobacco: Never Used  . Tobacco comment: smoked on and off 16 years- quit 2016  Substance Use Topics  . Alcohol use: Yes  . Drug use: No    Frequency: 1.0 times per week    Types: "Crack" cocaine, Cocaine     Allergies   Codeine; Penicillins; Morphine; Coconut flavor; Coconut oil; Grapefruit concentrate; Morphine and related; Oxycodone; and Norco [hydrocodone-acetaminophen]   Review of Systems Review of Systems  All other systems reviewed and are negative.    Physical Exam Updated Vital Signs BP (!) 133/93   Pulse (!) 116   Temp 98.3 F (36.8 C) (Oral)   Resp 16   Ht 5\' 6"  (1.676 m)   Wt 68 kg (150 lb)   SpO2 97%   BMI 24.21 kg/m   Physical Exam  Constitutional: He is oriented to person, place, and time. He appears well-developed and well-nourished.  Non-toxic appearance. No distress.  HENT:  Head: Normocephalic and atraumatic.  Mouth/Throat: Oropharynx is clear and  moist.  Eyes: Pupils are equal, round, and reactive to light. Conjunctivae are normal. Right eye exhibits no discharge. Left eye exhibits no discharge.  Neck: Normal range of motion. Neck supple.  Cardiovascular: Regular rhythm, normal heart sounds and intact distal pulses. Exam reveals no gallop and no friction rub.  No murmur heard. Tachycardia  Pulmonary/Chest: Effort normal and breath sounds normal. No stridor. No respiratory distress. He has no wheezes. He has no rales. He exhibits no tenderness.  Abdominal: Soft. Normal appearance and bowel sounds are normal. He exhibits no distension. There is tenderness in the epigastric area. There is no rigidity, no rebound, no guarding, no CVA tenderness, no tenderness at McBurney's point and negative Murphy's sign.  Genitourinary:  Genitourinary Comments: Chaperone present for exam. Pt tolerated without difficulty. No external hemorrhoids or fissures noted. No pain with palpation of the rectal vault. No internal hemorrhoids noted. Soft brown stool noted in the rectal vault. No gross hematochezia or melena. Hemoccult negative.   Musculoskeletal: Normal range of motion. He exhibits no tenderness.  Lymphadenopathy:    He has no cervical adenopathy.  Neurological: He is alert and oriented to person, place, and time.  Skin: Skin is warm and dry. Capillary refill takes less than 2 seconds. No rash noted.  Psychiatric: His behavior is normal. Judgment and thought content normal.  Nursing note and vitals reviewed.    ED Treatments / Results  Labs (all labs ordered are listed, but only abnormal results are displayed) Labs Reviewed  COMPREHENSIVE METABOLIC PANEL - Abnormal; Notable for the following components:      Result Value   Chloride 95 (*)    AST 369 (*)    ALT 301 (*)    Anion gap 19 (*)    All other components within normal limits  ETHANOL - Abnormal; Notable for the following components:   Alcohol, Ethyl (B) 120 (*)    All other  components within normal limits  ACETAMINOPHEN LEVEL - Abnormal; Notable for the following components:   Acetaminophen (Tylenol), Serum <10 (*)    All other components within normal limits  CBC - Abnormal; Notable for the following components:   WBC 3.6 (*)    Platelets 50 (*)    All other components within normal limits  RAPID URINE DRUG SCREEN, HOSP PERFORMED - Abnormal; Notable for the following components:   Cocaine POSITIVE (*)    Barbiturates POSITIVE (*)    All other components within  normal limits  URINALYSIS, ROUTINE W REFLEX MICROSCOPIC - Abnormal; Notable for the following components:   Color, Urine AMBER (*)    Hgb urine dipstick SMALL (*)    Bilirubin Urine SMALL (*)    Ketones, ur 80 (*)    Protein, ur 100 (*)    All other components within normal limits  ACETAMINOPHEN LEVEL - Abnormal; Notable for the following components:   Acetaminophen (Tylenol), Serum <10 (*)    All other components within normal limits  BASIC METABOLIC PANEL - Abnormal; Notable for the following components:   Chloride 99 (*)    CO2 21 (*)    Glucose, Bld 112 (*)    Calcium 8.4 (*)    Anion gap 16 (*)    All other components within normal limits  CBG MONITORING, ED - Abnormal; Notable for the following components:   Glucose-Capillary 109 (*)    All other components within normal limits  CBG MONITORING, ED - Abnormal; Notable for the following components:   Glucose-Capillary 60 (*)    All other components within normal limits  SALICYLATE LEVEL  LIPASE, BLOOD  CBG MONITORING, ED  POC OCCULT BLOOD, ED  CBG MONITORING, ED  CBG MONITORING, ED  CBG MONITORING, ED  CBG MONITORING, ED    EKG None  Radiology Dg Abdomen Acute W/chest  Result Date: 11/22/2017 CLINICAL DATA:  Overdose today. EXAM: DG ABDOMEN ACUTE W/ 1V CHEST COMPARISON:  None. FINDINGS: Single-view of the chest demonstrates clear lungs and normal heart size. No pneumothorax or pleural effusion. Postoperative change of repair  of left AC joint separation noted. Two views of the abdomen show normal bowel gas pattern. No free intraperitoneal air. No unexpected abdominal calcification or focal bony abnormality. IMPRESSION: No acute abnormality chest or abdomen. Electronically Signed   By: Inge Rise M.D.   On: 11/22/2017 12:59    Procedures Procedures (including critical care time)  Medications Ordered in ED Medications  multivitamin with minerals tablet 1 tablet (1 tablet Oral Refused 11/22/17 0907)  pantoprazole (PROTONIX) EC tablet 40 mg (40 mg Oral Given 11/22/17 1156)  sodium chloride 0.9 % bolus 1,000 mL (0 mLs Intravenous Stopped 11/22/17 0836)  ondansetron (ZOFRAN) injection 4 mg (4 mg Intravenous Given 11/22/17 0737)  gi cocktail (Maalox,Lidocaine,Donnatal) (30 mLs Oral Given 11/22/17 0753)  famotidine (PEPCID) IVPB 20 mg premix (0 mg Intravenous Stopped 11/22/17 0906)  thiamine (B-1) injection 100 mg (100 mg Intravenous Given 11/22/17 1127)  haloperidol lactate (HALDOL) injection 2 mg (2 mg Intravenous Given 11/22/17 1153)  gi cocktail (Maalox,Lidocaine,Donnatal) (30 mLs Oral Given 11/22/17 1258)  dextrose 5 % solution ( Intravenous Stopped 11/22/17 1356)  LORazepam (ATIVAN) tablet 1 mg (1 mg Oral Given 11/22/17 1357)  sodium chloride 0.9 % bolus 500 mL (0 mLs Intravenous Stopped 11/22/17 1515)  sodium chloride 0.9 % bolus 1,000 mL (0 mLs Intravenous Stopped 11/22/17 1538)     Initial Impression / Assessment and Plan / ED Course  I have reviewed the triage vital signs and the nursing notes.  Pertinent labs & imaging results that were available during my care of the patient were reviewed by me and considered in my medical decision making (see chart for details).     Presents to the ED for evaluation of alcohol intoxication and overdose of trazodone and attempt to harm himself.  History of same.  Also reports epigastric pain with vomiting.  He reports tarry stools over the past several days.  Denies any  hematemesis.  Patient tachycardic on  exam.  Patient is not hypotensive.  No fever or hypoxia noted.  Patient appears uncomfortable on exam.  Does have some mild epigastric tenderness.  No signs of peritonitis.  Bowel sounds present in all 4 quadrants.  Rectal exam reveals no melena or hematochezia.  Lungs clear to auscultation bilaterally.  Poison control was called by nursing staff.  They recommend observation including 4-hour Tylenol level, repeat EKG, fluids and watch for CNS depression and vomiting.  Initial lab work revealed no leukocytosis.  Patient does have a gap of 19 with history of same.  I suspect this is from dehydration and alcohol abuse.  [name] blood sugar was 68 initially.  Bicarb normal.  Elevated liver enzymes with history of same likely secondary to alcohol use.  Ethanol level was 120.  Normal Tylenol and salicylate level.  UA with ketones but no signs of infection.  Initial EKG showed normal sinus rhythm.  Acute abdomen and chest showed no acute abnormality's.  I suspect the patient's epigastric pain and vomiting is secondary to gastritis likely alcohol induced.  Normal lipase.  Doubt pancreatitis.  No focal right upper quadrant tenderness low suspicion for cholecystitis.  Doubt obstruction.  Patient is afebrile with any leukocytosis.  Doubt diverticulitis, appendicitis.  Feel that CT imaging is indicated as he had one last month that was unremarkable for any acute findings.  Patient in Zofran without any improvement in his vomiting.  Given that patient had normal QT Haldol was used.  This significantly improved his nausea and abdominal pain.  She was given 2 and half liters of fluid.  He initially was given 5% dextrose and fusion.  Patient was then able to maintain p.o. food and fluid.  Dextrose was discontinued.  [name] blood sugar remained stable in the 90s.  Patient heart rate has remained in the low 100s.  Repeat EKG does show slightly prolonged QT.  Will need to avoid  meds that prolonged QT at this time.  Patient does not have any signs of vomiting.  Repeat abdominal exam was benign.  Repeat BMP showed a improving anion gap of 16 with a slightly decreased bicarb.  Tylenol level was normal.  CR was 8.  He was given 1 mg of p.o. Ativan to help with his shaking.  He states that this did help.  Was notified by nursing staff that patient's blood sugar was now 60.  Tolerating juice at this time.  Given patient's hypoglycemia, prolonged QT, vomiting and nausea would benefit from hospital admission.  TS did evaluate patient he does meet inpatient criteria.  Spoke with Dr. [name] with hospital medicine who agrees to admission will see patient in the ED and place admission orders.    Final Clinical Impressions(s) / ED Diagnoses   Final diagnoses:  Suicidal ideation  Alcoholic intoxication with complication ([place])  Polysubstance abuse ([place])  Hypoglycemia  Epigastric abdominal pain  Nausea and vomiting, intractability of vomiting not specified, unspecified vomiting type    ED Discharge Orders    None       Aaron Edelman 11/22/17 1749    Daleen Bo, MD 11/23/17 [phone]

## 2017-11-22 NOTE — ED Notes (Signed)
Informed pt that UA was needed. Pt stated he did not have the urge to urinate but may later.

## 2017-11-22 NOTE — ED Notes (Signed)
AWARE OF NEED FOR URINE SAMPLE. UNABLE TO GO AT THIS TIME

## 2017-11-22 NOTE — ED Triage Notes (Signed)
Patient arrives by [facility] with complaints of overdose Trazadone-took 15 of 100 mg tabs at 0500. Drank 2 bottles of listerine-patient vomiting-EMS administered Zofran 4 mg IV for nausea and vomiting-off psych meds-hearing voices to self harm-no HI/self minimizing. Patient states unable to afford Abilify, Metoprolol. States he can afford Trazadone.

## 2017-11-22 NOTE — ED Notes (Signed)
Date and time results received: 11/22/17  (use smartphrase ".now" to insert current time)  Test: CBG Critical Value: 60  Name of Provider Notified: EDPA TYLER  Orders Received? Or Actions Taken? GIVE PT JUICE

## 2017-11-22 NOTE — ED Notes (Signed)
SITTER GAVE PT JUICE. PT ALERT AND ACTIVE WITH CARE

## 2017-11-23 DIAGNOSIS — F1424 Cocaine dependence with cocaine-induced mood disorder: Secondary | ICD-10-CM

## 2017-11-23 DIAGNOSIS — F10929 Alcohol use, unspecified with intoxication, unspecified: Secondary | ICD-10-CM

## 2017-11-23 DIAGNOSIS — T50902D Poisoning by unspecified drugs, medicaments and biological substances, intentional self-harm, subsequent encounter: Secondary | ICD-10-CM

## 2017-11-23 LAB — CBC WITH DIFFERENTIAL/PLATELET
Basophils Absolute: 0 10*3/uL (ref 0.0–0.1)
Basophils Relative: 0 %
EOS ABS: 0 10*3/uL (ref 0.0–0.7)
Eosinophils Relative: 1 %
HEMATOCRIT: 37.2 % — AB (ref 39.0–52.0)
Hemoglobin: 13.2 g/dL (ref 13.0–17.0)
LYMPHS ABS: 0.9 10*3/uL (ref 0.7–4.0)
Lymphocytes Relative: 49 %
MCH: 30.3 pg (ref 26.0–34.0)
MCHC: 35.5 g/dL (ref 30.0–36.0)
MCV: 85.5 fL (ref 78.0–100.0)
MONO ABS: 0.2 10*3/uL (ref 0.1–1.0)
MONOS PCT: 10 %
Neutro Abs: 0.8 10*3/uL — ABNORMAL LOW (ref 1.7–7.7)
Neutrophils Relative %: 40 %
PLATELETS: DECREASED 10*3/uL (ref 150–400)
RBC: 4.35 MIL/uL (ref 4.22–5.81)
RDW: 15 % (ref 11.5–15.5)
WBC: 1.9 10*3/uL — AB (ref 4.0–10.5)

## 2017-11-23 LAB — COMPREHENSIVE METABOLIC PANEL
ALBUMIN: 3.3 g/dL — AB (ref 3.5–5.0)
ALT: 210 U/L — ABNORMAL HIGH (ref 17–63)
AST: 266 U/L — AB (ref 15–41)
Alkaline Phosphatase: 61 U/L (ref 38–126)
Anion gap: 8 (ref 5–15)
BUN: 6 mg/dL (ref 6–20)
CHLORIDE: 100 mmol/L — AB (ref 101–111)
CO2: 24 mmol/L (ref 22–32)
CREATININE: 0.83 mg/dL (ref 0.61–1.24)
Calcium: 8.5 mg/dL — ABNORMAL LOW (ref 8.9–10.3)
GFR calc Af Amer: >60 mL/min (ref >60)
Glucose, Bld: 117 mg/dL — ABNORMAL HIGH (ref 65–99)
Potassium: 3.7 mmol/L (ref 3.5–5.1)
Sodium: 132 mmol/L — ABNORMAL LOW (ref 135–145)
Total Bilirubin: 1.3 mg/dL — ABNORMAL HIGH (ref 0.3–1.2)
Total Protein: 6.2 g/dL — ABNORMAL LOW (ref 6.5–8.1)

## 2017-11-23 LAB — PATHOLOGIST SMEAR REVIEW

## 2017-11-23 LAB — CBG MONITORING, ED
GLUCOSE-CAPILLARY: 107 mg/dL — AB (ref 65–99)
GLUCOSE-CAPILLARY: 114 mg/dL — AB (ref 65–99)
GLUCOSE-CAPILLARY: 86 mg/dL (ref 65–99)
GLUCOSE-CAPILLARY: 93 mg/dL (ref 65–99)
Glucose-Capillary: 109 mg/dL — ABNORMAL HIGH (ref 65–99)
Glucose-Capillary: 101 mg/dL — ABNORMAL HIGH (ref 65–99)

## 2017-11-23 LAB — GLUCOSE, CAPILLARY
Glucose-Capillary: 99 mg/dL (ref 65–99)
Glucose-Capillary: 113 mg/dL — ABNORMAL HIGH (ref 65–99)

## 2017-11-23 MED ORDER — ADULT MULTIVITAMIN W/MINERALS CH
1.0000 | ORAL_TABLET | Freq: Every day | ORAL | Status: DC
  Administered 2017-11-23 – 2017-11-27 (×5): 1 via ORAL
  Filled 2017-11-23 (×5): qty 1

## 2017-11-23 MED ORDER — LORAZEPAM 2 MG/ML IJ SOLN: 0.0000 mg | Freq: Two times a day (BID) | INTRAMUSCULAR | Status: AC

## 2017-11-23 MED ORDER — THIAMINE HCL 100 MG/ML IJ SOLN: 100.0000 mg | Freq: Every day | INTRAMUSCULAR | Status: DC

## 2017-11-23 MED ORDER — POLYETHYLENE GLYCOL 3350 17 G PO PACK: 17.0000 g | PACK | Freq: Every day | ORAL | Status: DC | PRN

## 2017-11-23 MED ORDER — LORAZEPAM 2 MG/ML IJ SOLN
1.0000 mg | Freq: Four times a day (QID) | INTRAMUSCULAR | Status: AC | PRN
  Administered 2017-11-24: 1 mg via INTRAVENOUS

## 2017-11-23 MED ORDER — LORAZEPAM 1 MG PO TABS
1.0000 mg | ORAL_TABLET | Freq: Four times a day (QID) | ORAL | Status: AC | PRN
  Administered 2017-11-25: 1 mg via ORAL
  Filled 2017-11-23: qty 1

## 2017-11-23 MED ORDER — DEXTROSE-NACL 5-0.45 % IV SOLN
INTRAVENOUS | Status: DC
  Administered 2017-11-23 – 2017-11-25 (×4): via INTRAVENOUS

## 2017-11-23 MED ORDER — VITAMIN B-1 100 MG PO TABS
100.0000 mg | ORAL_TABLET | Freq: Every day | ORAL | Status: DC
  Administered 2017-11-23 – 2017-11-27 (×5): 100 mg via ORAL
  Filled 2017-11-23 (×5): qty 1

## 2017-11-23 MED ORDER — SODIUM CHLORIDE 0.9% FLUSH
3.0000 mL | Freq: Two times a day (BID) | INTRAVENOUS | Status: DC
  Administered 2017-11-25 – 2017-11-27 (×4): 3 mL via INTRAVENOUS

## 2017-11-23 MED ORDER — FOLIC ACID 1 MG PO TABS
1.0000 mg | ORAL_TABLET | Freq: Every day | ORAL | Status: DC
  Administered 2017-11-23 – 2017-11-27 (×5): 1 mg via ORAL
  Filled 2017-11-23 (×5): qty 1

## 2017-11-23 MED ORDER — LORAZEPAM 2 MG/ML IJ SOLN
0.0000 mg | Freq: Four times a day (QID) | INTRAMUSCULAR | Status: AC
  Administered 2017-11-23 – 2017-11-24 (×3): 1 mg via INTRAVENOUS
  Administered 2017-11-24: 2 mg via INTRAVENOUS
  Filled 2017-11-23 (×4): qty 1

## 2017-11-23 NOTE — ED Notes (Signed)
Patient provided with orange juice  

## 2017-11-23 NOTE — Progress Notes (Signed)
PROGRESS NOTE    [name]  ASN:[id number] DOB: 1980-10-05 DOA: 11/22/2017 PCP: Patient, No Pcp Per    Brief Narrative:  [name] is a 37 y.o. male with medical history significant for polysubstance abuse (alcohol, cocaine, tobacco), PVD, HLD, history of breast CA, renal CA, PTSD, depression with history of multiple suicide attempts presents to the ED complaining of intractable nausea/vomiting, epigastric pain after an attempt to commit suicide by consuming about 15 pills of 100 mg trazodone and large amounts of Listerine.    Assessment & Plan:   Principal Problem:   Overdose Active Problems:   HYPERCHOLESTEROLEMIA   Essential hypertension   Alcohol abuse   Episode of recurrent major depressive disorder (HCC)   Left kidney mass   Cocaine dependence ([place])   Polysubstance abuse ([place])   Suicidal ideation   Alcohol withdrawal (HCC)   Severe episode of recurrent major depressive disorder, without psychotic features ([place])   Trazodone overdose/ Suicide attempt:  - prolonged QTC.  - Hold home trazodone.  - pscychiatry consult.  - plan to transfer to [facility] once stable.    Alcohol dependence:  On CIwa for alcohol withdrawal symptoms.    Transaminitis: Suspect alcohol hepatitis.  Improving.  Continue with hydration and repeat hepatic panel.    Polysubstance abuse:  UDS positive for cocaine.  Counseling given.  Social work consult.     Hypertension:  Well controlled.    Thrombocytopenia:  Probably from alcohol use.  No signs of bleeding.    Depression/ anxiety: Further meds as per psychiatry.     DVT prophylaxis: scd's Code Status:  Full code.  Family Communication: none at bedside.  Disposition Plan[facility] when stable.    Consultants:   Psychiatry.   Procedures: none.  Antimicrobials: none.  Subjective: No new complaints. Wants to sleep, reported that he hasn't slept.   Objective: Vitals:   11/23/17 1331 11/23/17 1405 11/23/17 1442 11/23/17  1459  BP: (!) 132/100 (!) 121/95  (!) 121/95  Pulse: 77 92  72  Resp: 16 14  15   Temp:   98.8 F (37.1 C)   TempSrc:   Oral   SpO2: 97% 99%  98%  Weight:      Height:        Intake/Output Summary (Last 24 hours) at 11/23/2017 1501 Last data filed at 11/23/2017 0510 Gross per 24 hour  Intake 4660 ml  Output 725 ml  Net 3935 ml   Filed Weights   11/22/17 0633  Weight: 68 kg (150 lb)    Examination:  General exam: Appears calm and comfortable  Respiratory system: Clear to auscultation. Respiratory effort normal. Cardiovascular system: S1 & S2 heard, RRR. No JVD, murmurs, rubs, gallops or clicks. No pedal edema. Gastrointestinal system: Abdomen is nondistended, soft and nontender. No organomegaly or masses felt. Normal bowel sounds heard. Central nervous system: Alert and oriented. No focal neurological deficits. Extremities: Symmetric 5 x 5 power. Skin: No rashes, lesions or ulcers Psychiatry: Judgement and insight appear normal. Mood & affect appropriate.     Data Reviewed: I have personally reviewed following labs and imaging studies  CBC: Recent Labs  Lab 11/17/17 1617 11/22/17 0706 11/23/17 0605  WBC 3.6* 3.6* 1.9*  NEUTROABS 1.0*  --  0.8*  HGB 14.2 14.1 13.2  HCT 41.4 39.5 37.2*  MCV 85.9 84.6 85.5  PLT 66* 50* PLATELET CLUMPS NOTED ON SMEAR, COUNT APPEARS DECREASED   Basic Metabolic Panel: Recent Labs  Lab 11/17/17 1617 11/22/17 0706 11/22/17 1405  11/23/17 0605  NA 140 136 136 132*  K 4.6 3.9 4.1 3.7  CL 103 95* 99* 100*  CO2 26 22 21* 24  GLUCOSE 74 78 112* 117*  BUN 8 8 8 6   CREATININE 0.86 0.83 0.82 0.83  CALCIUM 8.1* 8.9 8.4* 8.5*   GFR: Estimated Creatinine Clearance: 108.9 mL/min (by C-G formula based on SCr of 0.83 mg/dL). Liver Function Tests: Recent Labs  Lab 11/17/17 1617 11/22/17 0706 11/23/17 0605  AST 469* 369* 266*  ALT 305* 301* 210*  ALKPHOS 70 84 61  BILITOT 0.8 1.2 1.3*  PROT 6.6 7.7 6.2*  ALBUMIN 3.5 4.1 3.3*    Recent Labs  Lab 11/17/17 1617 11/22/17 0706  LIPASE 37 21   No results for input(s): AMMONIA in the last 168 hours. Coagulation Profile: No results for input(s): INR, PROTIME in the last 168 hours. Cardiac Enzymes: No results for input(s): CKTOTAL, CKMB, CKMBINDEX, TROPONINI in the last 168 hours. BNP (last 3 results) No results for input(s): PROBNP in the last 8760 hours. HbA1C: No results for input(s): HGBA1C in the last 72 hours. CBG: Recent Labs  Lab 11/23/17 0211 11/23/17 0314 11/23/17 0407 11/23/17 0927 11/23/17 1207  GLUCAP 86 114* 109* 101* 107*   Lipid Profile: No results for input(s): CHOL, HDL, LDLCALC, TRIG, CHOLHDL, LDLDIRECT in the last 72 hours. Thyroid Function Tests: No results for input(s): TSH, T4TOTAL, FREET4, T3FREE, THYROIDAB in the last 72 hours. Anemia Panel: No results for input(s): VITAMINB12, FOLATE, FERRITIN, TIBC, IRON, RETICCTPCT in the last 72 hours. Sepsis Labs: No results for input(s): PROCALCITON, LATICACIDVEN in the last 168 hours.  No results found for this or any previous visit (from the past 240 hour(s)).       Radiology Studies: Dg Abdomen Acute W/chest  Result Date: 11/22/2017 CLINICAL DATA:  Overdose today. EXAM: DG ABDOMEN ACUTE W/ 1V CHEST COMPARISON:  None. FINDINGS: Single-view of the chest demonstrates clear lungs and normal heart size. No pneumothorax or pleural effusion. Postoperative change of repair of left AC joint separation noted. Two views of the abdomen show normal bowel gas pattern. No free intraperitoneal air. No unexpected abdominal calcification or focal bony abnormality. IMPRESSION: No acute abnormality chest or abdomen. Electronically Signed   By: Inge Rise M.D.   On: 11/22/2017 12:59        Scheduled Meds: . amLODipine  5 mg Oral Daily  . folic acid  1 mg Oral Daily  . LORazepam  0-4 mg Intravenous Q6H   Followed by  . [START ON 11/25/2017] LORazepam  0-4 mg Intravenous Q12H  . Melatonin  5  mg Oral QHS  . multivitamin with minerals  1 tablet Oral Once  . multivitamin with minerals  1 tablet Oral Daily  . pantoprazole  40 mg Oral Daily  . sodium chloride flush  3 mL Intravenous Q12H  . thiamine  100 mg Oral Daily   Or  . thiamine  100 mg Intravenous Daily   Continuous Infusions: . dextrose 5 % and 0.45% NaCl 100 mL/hr at 11/23/17 0311     LOS: 1 day    Time spent: 35 minutes.     Hosie Poisson, MD [facility] Pager [phone]  If 7PM-7AM, please contact night-coverage [email or web address] Password [email or web address] 11/23/2017, 3:01 PM

## 2017-11-24 LAB — CBC WITH DIFFERENTIAL/PLATELET
Basophils Absolute: 0 10*3/uL (ref 0.0–0.1)
Basophils Relative: 0 %
EOS ABS: 0.1 10*3/uL (ref 0.0–0.7)
EOS PCT: 2 %
HCT: 38.8 % — ABNORMAL LOW (ref 39.0–52.0)
Hemoglobin: 13.5 g/dL (ref 13.0–17.0)
LYMPHS ABS: 1.2 10*3/uL (ref 0.7–4.0)
LYMPHS PCT: 39 %
MCH: 29.7 pg (ref 26.0–34.0)
MCHC: 34.8 g/dL (ref 30.0–36.0)
MCV: 85.5 fL (ref 78.0–100.0)
MONOS PCT: 6 %
Monocytes Absolute: 0.2 10*3/uL (ref 0.1–1.0)
Neutro Abs: 1.7 10*3/uL (ref 1.7–7.7)
Neutrophils Relative %: 53 %
PLATELETS: 58 10*3/uL — AB (ref 150–400)
RBC: 4.54 MIL/uL (ref 4.22–5.81)
RDW: 14.8 % (ref 11.5–15.5)
WBC: 3.1 10*3/uL — ABNORMAL LOW (ref 4.0–10.5)

## 2017-11-24 LAB — GLUCOSE, CAPILLARY
GLUCOSE-CAPILLARY: 97 mg/dL (ref 65–99)
Glucose-Capillary: 100 mg/dL — ABNORMAL HIGH (ref 65–99)
Glucose-Capillary: 120 mg/dL — ABNORMAL HIGH (ref 65–99)
Glucose-Capillary: 86 mg/dL (ref 65–99)
Glucose-Capillary: 91 mg/dL (ref 65–99)
Glucose-Capillary: 93 mg/dL (ref 65–99)

## 2017-11-24 LAB — HEPATIC FUNCTION PANEL
ALK PHOS: 69 U/L (ref 38–126)
ALT: 267 U/L — AB (ref 17–63)
AST: 390 U/L — ABNORMAL HIGH (ref 15–41)
Albumin: 3.2 g/dL — ABNORMAL LOW (ref 3.5–5.0)
BILIRUBIN DIRECT: 0.2 mg/dL (ref 0.1–0.5)
Indirect Bilirubin: 0.6 mg/dL (ref 0.3–0.9)
TOTAL PROTEIN: 6.2 g/dL — AB (ref 6.5–8.1)
Total Bilirubin: 0.8 mg/dL (ref 0.3–1.2)

## 2017-11-24 LAB — MAGNESIUM: Magnesium: 1.4 mg/dL — ABNORMAL LOW (ref 1.7–2.4)

## 2017-11-24 LAB — PHOSPHORUS: Phosphorus: 3.1 mg/dL (ref 2.5–4.6)

## 2017-11-24 NOTE — Care Management Note (Signed)
Case Management Note  Patient Details  Name: [name] MRN: [id number] Date of Birth: December 29, 1980  Subjective/Objective: CM referral for trouble affording meds. Patient will d/c back to [facility] they will manage meds there. Psych CSW following.                   Action/Plan:d/c Psych hospital   Expected Discharge Date:  (unknown)               Expected Discharge Plan:  Psych[facility] Hospital  In-House Referral:  Clinical Social Work  Discharge planning Services  CM Consult  Post Acute Care Choice:    Choice offered to:     DME Arranged:    DME Agency:     HH Arranged:    [place] Agency:     Status of Service:  In process, will continue to follow  If discussed at Long Length of Stay Meetings, dates discussed:    Additional Comments:  Dessa Phi, RN 11/24/2017, 11:58 AM

## 2017-11-24 NOTE — Progress Notes (Signed)
PROGRESS NOTE    [name]  WER:[id number] DOB: November 15, 1980 DOA: 11/22/2017 PCP: Patient, No Pcp Per    Brief Narrative:  [name] is a 37 y.o. male with medical history significant for polysubstance abuse (alcohol, cocaine, tobacco), PVD, HLD, history of breast CA, renal CA, PTSD, depression with history of multiple suicide attempts presents to the ED complaining of intractable nausea/vomiting, epigastric pain after an attempt to commit suicide by consuming about 15 pills of 100 mg trazodone and large amounts of Listerine.    Assessment & Plan:   Principal Problem:   Overdose Active Problems:   HYPERCHOLESTEROLEMIA   Essential hypertension   Alcohol abuse   Episode of recurrent major depressive disorder (HCC)   Left kidney mass   Cocaine dependence ([place])   Polysubstance abuse ([name])   Suicidal ideation   Alcohol withdrawal (HCC)   Severe episode of recurrent major depressive disorder, without psychotic features ([place])   Trazodone overdose/ Suicide attempt:  - prolonged QTC. Repeat EKG for prolonged Qtc.  - Hold home trazodone.  - pscychiatry consult.  - plan to transfer to [facility] once stable.    Alcohol dependence:  On CIwa for alcohol withdrawal symptoms.   Transaminitis: Suspect alcohol hepatitis.  Get US abdomen. Pt denies any nausea or vomiting. Or abdominal pian.  Continue with hydration and repeat hepatic panel.    Polysubstance abuse:  UDS positive for cocaine.  Counseling given.  Social work consult.     Hypertension:  Well controlled.    Thrombocytopenia:  Probably from alcohol use.  No signs of bleeding.    Depression/ anxiety: Further meds as per psychiatry.   Leukopenia:  - improving.     DVT prophylaxis: scd's Code Status:  Full code.  Family Communication: none at bedside.  Disposition Plan: to [facility] when stable.    Consultants:   Psychiatry.   Procedures: none.  Antimicrobials: none.  Subjective: No nausea, vomiting or  abdominal pain. No palpitations.   Objective: Vitals:   11/23/17 1536 11/23/17 2012 11/24/17 0249 11/24/17 1129  BP: 118/87 (!) 141/112 (!) 128/102 (!) 137/113  Pulse: 83 93 98 (!) 113  Resp: 16 14 14 18   Temp: 98.9 F (37.2 C) 98.6 F (37 C) 98.6 F (37 C)   TempSrc: Oral Oral Oral   SpO2: 99% 99% 97% 98%  Weight:      Height:        Intake/Output Summary (Last 24 hours) at 11/24/2017 1454 Last data filed at 11/24/2017 1233 Gross per 24 hour  Intake 2861.67 ml  Output 775 ml  Net 2086.67 ml   Filed Weights   11/22/17 0633  Weight: 68 kg (150 lb)    Examination:  General exam: Appears calm and comfortable  Respiratory system: Clear to auscultation. Respiratory effort normal. Cardiovascular system: S1 & S2 heard, RRR. No JVD, murmurs. No pedal edema. Gastrointestinal system: Abdomen is soft non tender non distended bowel sounds heard.  Central nervous system: Alert and oriented. Non focal.  Extremities: no cyanosis or clubbing.  Skin: No rashes, lesions or ulcers Psychiatry:  Mood & affect appropriate.     Data Reviewed: I have personally reviewed following labs and imaging studies  CBC: Recent Labs  Lab 11/17/17 1617 11/22/17 0706 11/23/17 0605 11/24/17 1017  WBC 3.6* 3.6* 1.9* 3.1*  NEUTROABS 1.0*  --  0.8* 1.7  HGB 14.2 14.1 13.2 13.5  HCT 41.4 39.5 37.2* 38.8*  MCV 85.9 84.6 85.5 85.5  PLT 66* 50* PLATELET CLUMPS NOTED ON  SMEAR, COUNT APPEARS DECREASED 58*   Basic Metabolic Panel: Recent Labs  Lab 11/17/17 1617 11/22/17 0706 11/22/17 1405 11/23/17 0605  NA 140 136 136 132*  K 4.6 3.9 4.1 3.7  CL 103 95* 99* 100*  CO2 26 22 21* 24  GLUCOSE 74 78 112* 117*  BUN 8 8 8 6   CREATININE 0.86 0.83 0.82 0.83  CALCIUM 8.1* 8.9 8.4* 8.5*   GFR: Estimated Creatinine Clearance: 108.9 mL/min (by C-G formula based on SCr of 0.83 mg/dL). Liver Function Tests: Recent Labs  Lab 11/17/17 1617 11/22/17 0706 11/23/17 0605 11/24/17 1017  AST 469* 369* 266*  390*  ALT 305* 301* 210* 267*  ALKPHOS 70 84 61 69  BILITOT 0.8 1.2 1.3* 0.8  PROT 6.6 7.7 6.2* 6.2*  ALBUMIN 3.5 4.1 3.3* 3.2*   Recent Labs  Lab 11/17/17 1617 11/22/17 0706  LIPASE 37 21   No results for input(s): AMMONIA in the last 168 hours. Coagulation Profile: No results for input(s): INR, PROTIME in the last 168 hours. Cardiac Enzymes: No results for input(s): CKTOTAL, CKMB, CKMBINDEX, TROPONINI in the last 168 hours. BNP (last 3 results) No results for input(s): PROBNP in the last 8760 hours. HbA1C: No results for input(s): HGBA1C in the last 72 hours. CBG: Recent Labs  Lab 11/23/17 2009 11/24/17 0003 11/24/17 0427 11/24/17 0743 11/24/17 1143  GLUCAP 113* 93 91 100* 97   Lipid Profile: No results for input(s): CHOL, HDL, LDLCALC, TRIG, CHOLHDL, LDLDIRECT in the last 72 hours. Thyroid Function Tests: No results for input(s): TSH, T4TOTAL, FREET4, T3FREE, THYROIDAB in the last 72 hours. Anemia Panel: No results for input(s): VITAMINB12, FOLATE, FERRITIN, TIBC, IRON, RETICCTPCT in the last 72 hours. Sepsis Labs: No results for input(s): PROCALCITON, LATICACIDVEN in the last 168 hours.  No results found for this or any previous visit (from the past 240 hour(s)).       Radiology Studies: No results found.      Scheduled Meds: . amLODipine  5 mg Oral Daily  . folic acid  1 mg Oral Daily  . LORazepam  0-4 mg Intravenous Q6H   Followed by  . [START ON 11/25/2017] LORazepam  0-4 mg Intravenous Q12H  . Melatonin  5 mg Oral QHS  . multivitamin with minerals  1 tablet Oral Once  . multivitamin with minerals  1 tablet Oral Daily  . pantoprazole  40 mg Oral Daily  . sodium chloride flush  3 mL Intravenous Q12H  . thiamine  100 mg Oral Daily   Or  . thiamine  100 mg Intravenous Daily   Continuous Infusions: . dextrose 5 % and 0.45% NaCl 100 mL/hr at 11/24/17 1127     LOS: 2 days    Time spent: 30 minutes.     Hosie Poisson, MD [facility] Pager [phone]  If 7PM-7AM, please contact night-coverage [email or web address] Password [email or web address] 11/24/2017, 2:54 PM

## 2017-11-25 ENCOUNTER — Inpatient Hospital Stay (HOSPITAL_COMMUNITY): Payer: Self-pay

## 2017-11-25 LAB — CBC WITH DIFFERENTIAL/PLATELET
BASOS PCT: 0 %
Basophils Absolute: 0 10*3/uL (ref 0.0–0.1)
EOS ABS: 0.1 10*3/uL (ref 0.0–0.7)
Eosinophils Relative: 2 %
HCT: 37.5 % — ABNORMAL LOW (ref 39.0–52.0)
Hemoglobin: 13.1 g/dL (ref 13.0–17.0)
Lymphocytes Relative: 40 %
Lymphs Abs: 1.3 10*3/uL (ref 0.7–4.0)
MCH: 29.9 pg (ref 26.0–34.0)
MCHC: 34.9 g/dL (ref 30.0–36.0)
MCV: 85.6 fL (ref 78.0–100.0)
MONO ABS: 0.2 10*3/uL (ref 0.1–1.0)
Monocytes Relative: 7 %
Neutro Abs: 1.7 10*3/uL (ref 1.7–7.7)
Neutrophils Relative %: 51 %
Platelets: 85 10*3/uL — ABNORMAL LOW (ref 150–400)
RBC: 4.38 MIL/uL (ref 4.22–5.81)
RDW: 14.7 % (ref 11.5–15.5)
WBC: 3.3 10*3/uL — ABNORMAL LOW (ref 4.0–10.5)

## 2017-11-25 LAB — GLUCOSE, CAPILLARY
GLUCOSE-CAPILLARY: 92 mg/dL (ref 65–99)
GLUCOSE-CAPILLARY: 88 mg/dL (ref 65–99)
Glucose-Capillary: 82 mg/dL (ref 65–99)
Glucose-Capillary: 91 mg/dL (ref 65–99)
Glucose-Capillary: 97 mg/dL (ref 65–99)

## 2017-11-25 LAB — MAGNESIUM: MAGNESIUM: 2.4 mg/dL (ref 1.7–2.4)

## 2017-11-25 LAB — COMPREHENSIVE METABOLIC PANEL
ALT: 304 U/L — AB (ref 17–63)
AST: 408 U/L — AB (ref 15–41)
Albumin: 3.1 g/dL — ABNORMAL LOW (ref 3.5–5.0)
Alkaline Phosphatase: 70 U/L (ref 38–126)
Anion gap: 12 (ref 5–15)
BILIRUBIN TOTAL: 0.9 mg/dL (ref 0.3–1.2)
BUN: 5 mg/dL — AB (ref 6–20)
CALCIUM: 8.6 mg/dL — AB (ref 8.9–10.3)
CO2: 21 mmol/L — ABNORMAL LOW (ref 22–32)
CREATININE: 0.62 mg/dL (ref 0.61–1.24)
Chloride: 100 mmol/L — ABNORMAL LOW (ref 101–111)
GFR calc Af Amer: >60 mL/min (ref >60)
GFR calc non Af Amer: >60 mL/min (ref >60)
Glucose, Bld: 93 mg/dL (ref 65–99)
Potassium: 3 mmol/L — ABNORMAL LOW (ref 3.5–5.1)
Sodium: 133 mmol/L — ABNORMAL LOW (ref 135–145)
TOTAL PROTEIN: 6.3 g/dL — AB (ref 6.5–8.1)

## 2017-11-25 MED ORDER — POTASSIUM CHLORIDE CRYS ER 20 MEQ PO TBCR
40.0000 meq | EXTENDED_RELEASE_TABLET | Freq: Two times a day (BID) | ORAL | Status: AC
  Administered 2017-11-25 (×2): 40 meq via ORAL
  Filled 2017-11-25 (×2): qty 2

## 2017-11-25 MED ORDER — MAGNESIUM SULFATE 50 % IJ SOLN
3.0000 g | Freq: Once | INTRAVENOUS | Status: AC
  Administered 2017-11-25: 3 g via INTRAVENOUS
  Filled 2017-11-25: qty 6

## 2017-11-25 NOTE — Progress Notes (Signed)
PROGRESS NOTE    [name]  BJS:[id number] DOB: 14-Jan-1981 DOA: 11/22/2017 PCP: Patient, No Pcp Per    Brief Narrative:  [name] is a 37 y.o. male with medical history significant for polysubstance abuse (alcohol, cocaine, tobacco), PVD, HLD, history of breast CA, renal CA, PTSD, depression with history of multiple suicide attempts presents to the ED complaining of intractable nausea/vomiting, epigastric pain after an attempt to commit suicide by consuming about 15 pills of 100 mg trazodone and large amounts of Listerine.    Assessment & Plan:   Principal Problem:   Overdose Active Problems:   HYPERCHOLESTEROLEMIA   Essential hypertension   Alcohol abuse   Episode of recurrent major depressive disorder (HCC)   Left kidney mass   Cocaine dependence ([place])   Polysubstance abuse ([place])   Suicidal ideation   Alcohol withdrawal (HCC)   Severe episode of recurrent major depressive disorder, without psychotic features ([place])   Trazodone overdose/ Suicide attempt:  - prolonged QTC. Repeat EKG for prolonged Qtc pendng.  - Holding  home trazodone.  - pscychiatry consult.  - plan to transfer to [facility] once stable.    Alcohol dependence:  On CIwa for alcohol withdrawal symptoms.  So far no signs of withdrawal.   Transaminitis: Suspect alcohol hepatitis.  Get US abdomen. Pt denies any nausea or vomiting. Or abdominal pian.  Continue with hydration and repeat hepatic panel in am.    Polysubstance abuse:  UDS positive for cocaine.  Counseling given.  Social work consult.      Hypertension:  Well controlled.     Hypokalemia: replaced.   Thrombocytopenia:  Probably from alcohol use.  No signs of bleeding.  Improving.    Depression/ anxiety: Further meds as per psychiatry.   Leukopenia:  - improving.     DVT prophylaxis: scd's Code Status:  Full code.  Family Communication: none at bedside.  Disposition Plan: to [facility] when stable.    Consultants:    Psychiatry.   Procedures: none.  Antimicrobials: none.  Subjective: No new complaints.   Objective: Vitals:   11/25/17 0000 11/25/17 0609 11/25/17 0610 11/25/17 1234  BP: (!) 135/96 (!) 136/105 (!) 128/97 (!) 126/95  Pulse: 92 87  79  Resp:  18  18  Temp:  98.7 F (37.1 C)    TempSrc:  Oral    SpO2:  98%  100%  Weight:      Height:        Intake/Output Summary (Last 24 hours) at 11/25/2017 1709 Last data filed at 11/25/2017 1500 Gross per 24 hour  Intake 2020 ml  Output -  Net 2020 ml   Filed Weights   11/22/17 6073  Weight: 68 kg (150 lb)    Examination:  General exam: Appears calm and comfortable not in distress.  Respiratory system: Clear to auscultation. Respiratory effort normal. No wheezing or rhonchi.  Cardiovascular system: S1 & S2 heard, RRR. No JVD, murmurs. No pedal edema. Gastrointestinal system: Abdomen is soft NT ND BS+ Central nervous system: Alert and oriented. Non focal.  Extremities: no cyanosis or clubbing.  Skin: No rashes, lesions or ulcers Psychiatry:  Mood & affect appropriate.     Data Reviewed: I have personally reviewed following labs and imaging studies  CBC: Recent Labs  Lab 11/22/17 0706 11/23/17 0605 11/24/17 1017 11/25/17 0515  WBC 3.6* 1.9* 3.1* 3.3*  NEUTROABS  --  0.8* 1.7 1.7  HGB 14.1 13.2 13.5 13.1  HCT 39.5 37.2* 38.8* 37.5*  MCV 84.6 85.5  85.5 85.6  PLT 50* PLATELET CLUMPS NOTED ON SMEAR, COUNT APPEARS DECREASED 58* 85*   Basic Metabolic Panel: Recent Labs  Lab 11/22/17 0706 11/22/17 1405 11/23/17 0605 11/24/17 1017 11/25/17 0515  NA 136 136 132*  --  133*  K 3.9 4.1 3.7  --  3.0*  CL 95* 99* 100*  --  100*  CO2 22 21* 24  --  21*  GLUCOSE 78 112* 117*  --  93  BUN 8 8 6   --  5*  CREATININE 0.83 0.82 0.83  --  0.62  CALCIUM 8.9 8.4* 8.5*  --  8.6*  MG  --   --   --  1.4*  --   PHOS  --   --   --  3.1  --    GFR: Estimated Creatinine Clearance: 113 mL/min (by C-G formula based on SCr of 0.62  mg/dL). Liver Function Tests: Recent Labs  Lab 11/22/17 0706 11/23/17 0605 11/24/17 1017 11/25/17 0515  AST 369* 266* 390* 408*  ALT 301* 210* 267* 304*  ALKPHOS 84 61 69 70  BILITOT 1.2 1.3* 0.8 0.9  PROT 7.7 6.2* 6.2* 6.3*  ALBUMIN 4.1 3.3* 3.2* 3.1*   Recent Labs  Lab 11/22/17 0706  LIPASE 21   No results for input(s): AMMONIA in the last 168 hours. Coagulation Profile: No results for input(s): INR, PROTIME in the last 168 hours. Cardiac Enzymes: No results for input(s): CKTOTAL, CKMB, CKMBINDEX, TROPONINI in the last 168 hours. BNP (last 3 results) No results for input(s): PROBNP in the last 8760 hours. HbA1C: No results for input(s): HGBA1C in the last 72 hours. CBG: Recent Labs  Lab 11/24/17 2010 11/24/17 2354 11/25/17 0400 11/25/17 0724 11/25/17 1145  GLUCAP 120* 91 92 97 88   Lipid Profile: No results for input(s): CHOL, HDL, LDLCALC, TRIG, CHOLHDL, LDLDIRECT in the last 72 hours. Thyroid Function Tests: No results for input(s): TSH, T4TOTAL, FREET4, T3FREE, THYROIDAB in the last 72 hours. Anemia Panel: No results for input(s): VITAMINB12, FOLATE, FERRITIN, TIBC, IRON, RETICCTPCT in the last 72 hours. Sepsis Labs: No results for input(s): PROCALCITON, LATICACIDVEN in the last 168 hours.  No results found for this or any previous visit (from the past 240 hour(s)).       Radiology Studies: No results found.      Scheduled Meds: . amLODipine  5 mg Oral Daily  . folic acid  1 mg Oral Daily  . LORazepam  0-4 mg Intravenous Q12H  . Melatonin  5 mg Oral QHS  . multivitamin with minerals  1 tablet Oral Once  . multivitamin with minerals  1 tablet Oral Daily  . pantoprazole  40 mg Oral Daily  . potassium chloride  40 mEq Oral BID  . sodium chloride flush  3 mL Intravenous Q12H  . thiamine  100 mg Oral Daily   Or  . thiamine  100 mg Intravenous Daily   Continuous Infusions:    LOS: 3 days    Time spent: 30 minutes.     Hosie Poisson,  MD [facility] Pager [phone]  If 7PM-7AM, please contact night-coverage [email or web address] Password [email or web address] 11/25/2017, 5:09 PM

## 2017-11-26 DIAGNOSIS — R945 Abnormal results of liver function studies: Secondary | ICD-10-CM

## 2017-11-26 LAB — BASIC METABOLIC PANEL
Anion gap: 9 (ref 5–15)
BUN: 10 mg/dL (ref 6–20)
CHLORIDE: 105 mmol/L (ref 101–111)
CO2: 22 mmol/L (ref 22–32)
Calcium: 9.2 mg/dL (ref 8.9–10.3)
Creatinine, Ser: 0.84 mg/dL (ref 0.61–1.24)
GFR calc Af Amer: >60 mL/min (ref >60)
GFR calc non Af Amer: >60 mL/min (ref >60)
Glucose, Bld: 91 mg/dL (ref 65–99)
Potassium: 4.1 mmol/L (ref 3.5–5.1)
Sodium: 136 mmol/L (ref 135–145)

## 2017-11-26 LAB — HEPATIC FUNCTION PANEL
ALBUMIN: 3.2 g/dL — AB (ref 3.5–5.0)
ALK PHOS: 76 U/L (ref 38–126)
ALT: 377 U/L — ABNORMAL HIGH (ref 17–63)
AST: 471 U/L — AB (ref 15–41)
BILIRUBIN TOTAL: 0.7 mg/dL (ref 0.3–1.2)
Bilirubin, Direct: 0.1 mg/dL (ref 0.1–0.5)
Indirect Bilirubin: 0.6 mg/dL (ref 0.3–0.9)
Total Protein: 6.5 g/dL (ref 6.5–8.1)

## 2017-11-26 LAB — GLUCOSE, CAPILLARY
GLUCOSE-CAPILLARY: 111 mg/dL — AB (ref 65–99)
Glucose-Capillary: 91 mg/dL (ref 65–99)
Glucose-Capillary: 91 mg/dL (ref 65–99)
Glucose-Capillary: 94 mg/dL (ref 65–99)
Glucose-Capillary: 93 mg/dL (ref 65–99)
Glucose-Capillary: 111 mg/dL — ABNORMAL HIGH (ref 65–99)

## 2017-11-26 LAB — CBC
HCT: 37.8 % — ABNORMAL LOW (ref 39.0–52.0)
Hemoglobin: 13.3 g/dL (ref 13.0–17.0)
MCH: 29.7 pg (ref 26.0–34.0)
MCHC: 35.2 g/dL (ref 30.0–36.0)
MCV: 84.4 fL (ref 78.0–100.0)
Platelets: 120 10*3/uL — ABNORMAL LOW (ref 150–400)
RBC: 4.48 MIL/uL (ref 4.22–5.81)
RDW: 14.8 % (ref 11.5–15.5)
WBC: 3.9 10*3/uL — ABNORMAL LOW (ref 4.0–10.5)

## 2017-11-26 MED ORDER — ADULT MULTIVITAMIN W/MINERALS CH: 1.0000 | ORAL_TABLET | Freq: Once | ORAL | 0 refills | Status: DC

## 2017-11-26 MED ORDER — THIAMINE HCL 100 MG PO TABS: 100.0000 mg | ORAL_TABLET | Freq: Every day | ORAL | Status: DC

## 2017-11-26 MED ORDER — FOLIC ACID 1 MG PO TABS: 1.0000 mg | ORAL_TABLET | Freq: Every day | ORAL | Status: DC

## 2017-11-26 NOTE — Discharge Summary (Signed)
Physician Discharge Summary  Ariez Neilan POE:[id number] DOB: [birth year] DOA: 11/22/2017  PCP: Patient, No Pcp Per  Admit date: 11/22/2017 Discharge date: 11/26/2017  Admitted From:Home.  Disposition:  [facility]  Recommendations for Outpatient Follow-up:  1. Follow up with PCP in 1-2 weeks 2. Please obtain BMP/CBC in one week 3. Please hold statins for 6 to 8 weeks.     Discharge Condition stable.  CODE STATUS:full code.  Diet recommendation: Heart Healthy    Brief/Interim Summary: Leverne Buieis a 37 y.o.malewith medical history significant forpolysubstance abuse(alcohol, cocaine, tobacco),PVD, HLD, history of breast CA, renal CA, PTSD, depression with history of multiple suicide attempts presents to the ED complaining of intractable nausea/vomiting, epigastric pain after an attempt to commit suicide by consuming about 15 pills of 100 mg trazodone and large amounts of Listerine.    Discharge Diagnoses:  Principal Problem:   Overdose Active Problems:   HYPERCHOLESTEROLEMIA   Essential hypertension   Alcohol abuse   Episode of recurrent major depressive disorder (HCC)   Left kidney mass   Cocaine dependence ([place])   Polysubstance abuse ([place])   Suicidal ideation   Alcohol withdrawal (HCC)   Severe episode of recurrent major depressive disorder, without psychotic features ([place])  Trazodone overdose/ Suicide attempt:  - prolonged QTC improving.  - Holding  home trazodone.  - pscychiatry consult.  - plan to transfer to [facility] if bed is available.    Alcohol dependence:  On CIWA for alcohol withdrawal symptoms.  So far no signs of withdrawal.   Transaminitis: Suspect alcohol hepatitis.  Get US abdomen. Pt denies any nausea or vomiting. Or abdominal pian.  [place] ABD shows hepatic steatosis.    Polysubstance abuse:  UDS positive for cocaine.  Counseling given.  Social work consult.      Hypertension:  Well controlled.     Hypokalemia: replaced.    Thrombocytopenia:  Probably from alcohol use.  No signs of bleeding.  Improving.    Depression/ anxiety: Further meds as per psychiatry.   Leukopenia:  - improving.       Discharge Instructions  Discharge Instructions    Diet - low sodium heart healthy   Complete by:  As directed    Discharge instructions   Complete by:  As directed    Please follow up with PCP in one week.  Please re check liver function panel in 4 weeks.  Stay off the statins for 4 weeks .     Allergies as of 11/26/2017      Reactions   Codeine Hives, Itching, Swelling, Other (See Comments)   Does not impair breathing, however   Penicillins Swelling, Other (See Comments)   Has patient had a PCN reaction causing immediate rash, facial/tongue/throat swelling, SOB or lightheadedness with hypotension: Yes Has patient had a PCN reaction causing severe rash involving mucus membranes or skin necrosis: Yes Has patient had a PCN reaction that required hospitalization Yes-ed visit Has patient had a PCN reaction occurring within the last 10 years: Yes If all of the above answers are "NO", then may proceed with Cephalosporin use.   Morphine Itching   Coconut Flavor Itching, Swelling   Coconut Oil Other (See Comments)   Cannot take with some of his meds   Grapefruit Concentrate Other (See Comments)   Cannot take with some of his meds   Morphine And Related Itching, Swelling   Oxycodone Itching, Swelling   Norco [hydrocodone-acetaminophen] Itching, Rash      Medication List    STOP taking  these medications   atorvastatin 20 MG tablet Commonly known as:  LIPITOR   hydrOXYzine 25 MG tablet Commonly known as:  ATARAX/VISTARIL   ondansetron 4 MG tablet Commonly known as:  ZOFRAN   trazodone 300 MG tablet Commonly known as:  DESYREL   ziprasidone 20 MG capsule Commonly known as:  GEODON     TAKE these medications   amLODipine 5 MG tablet Commonly known as:  NORVASC Take 1 tablet (5 mg  total) by mouth daily. For high blood pressure What changed:    when to take this  additional instructions   folic acid 1 MG tablet Commonly known as:  FOLVITE Take 1 tablet (1 mg total) by mouth daily. Start taking on:  11/27/2017   gabapentin 300 MG capsule Commonly known as:  NEURONTIN Take 1 capsule (300 mg total) by mouth 3 (three) times daily.   Melatonin 5 MG Tabs Take 5-10 mg by mouth at bedtime.   multivitamin with minerals Tabs tablet Take 1 tablet by mouth once for 1 dose.   thiamine 100 MG tablet Take 1 tablet (100 mg total) by mouth daily. Start taking on:  11/27/2017       Allergies  Allergen Reactions  . Codeine Hives, Itching, Swelling and Other (See Comments)    Does not impair breathing, however  . Penicillins Swelling and Other (See Comments)    Has patient had a PCN reaction causing immediate rash, facial/tongue/throat swelling, SOB or lightheadedness with hypotension: Yes Has patient had a PCN reaction causing severe rash involving mucus membranes or skin necrosis: Yes Has patient had a PCN reaction that required hospitalization Yes-ed visit Has patient had a PCN reaction occurring within the last 10 years: Yes If all of the above answers are "NO", then may proceed with Cephalosporin use.   Marland Kitchen Morphine Itching  . Coconut Flavor Itching and Swelling  . Coconut Oil Other (See Comments)    Cannot take with some of his meds  . Grapefruit Concentrate Other (See Comments)    Cannot take with some of his meds  . Morphine And Related Itching and Swelling  . Oxycodone Itching and Swelling  . Norco [Hydrocodone-Acetaminophen] Itching and Rash    Consultations:  Psychiatry.    Procedures/Studies: US Abdomen Complete  Result Date: 11/25/2017 CLINICAL DATA:  Abnormal liver function tests. EXAM: ABDOMEN ULTRASOUND COMPLETE COMPARISON:  Body CT 10/20/2017 FINDINGS: Gallbladder: No gallstones or wall thickening visualized. No sonographic Murphy sign noted  by sonographer. Common bile duct: Diameter: 4.0 mm Liver: No focal lesion identified. Diffusely increased parenchymal echogenicity. Portal vein is patent on color Doppler imaging with normal direction of blood flow towards the liver. IVC: No abnormality visualized. Pancreas: Visualized portion unremarkable. Spleen: Size and appearance within normal limits. Right Kidney: Length: 11.2 cm. Echogenicity within normal limits. No mass or hydronephrosis visualized. Left Kidney: Length: 9.7 cm. Smaller due to prior partial nephrectomy. No mass or hydronephrosis visualized. Abdominal aorta: No aneurysm visualized. Other findings: None. IMPRESSION: Hepatic steatosis. Smaller left kidney, consistent with the history of prior partial nephrectomy. Electronically Signed   By: Fidela Salisbury M.D.   On: 11/25/2017 18:09   Dg Abdomen Acute W/chest  Result Date: 11/22/2017 CLINICAL DATA:  Overdose today. EXAM: DG ABDOMEN ACUTE W/ 1V CHEST COMPARISON:  None. FINDINGS: Single-view of the chest demonstrates clear lungs and normal heart size. No pneumothorax or pleural effusion. Postoperative change of repair of left AC joint separation noted. Two views of the abdomen show normal bowel gas pattern.  No free intraperitoneal air. No unexpected abdominal calcification or focal bony abnormality. IMPRESSION: No acute abnormality chest or abdomen. Electronically Signed   By: Inge Rise M.D.   On: 11/22/2017 12:59   Xr Clavicle Left  Result Date: 11/14/2017 X-rays show stable alignment of the fracture with great callus formation     Subjective: No new complaints.   Discharge Exam: Vitals:   11/26/17 0947 11/26/17 0948  BP: (!) 124/92 (!) 124/92  Pulse:  76  Resp:    Temp:    SpO2:  100%   Vitals:   11/26/17 0455 11/26/17 0945 11/26/17 0947 11/26/17 0948  BP: (!) 128/101 (!) 125/102 (!) 124/92 (!) 124/92  Pulse: 86 82  76  Resp: 16 16    Temp: 98.5 F (36.9 C) 98.9 F (37.2 C)    TempSrc: Oral Oral     SpO2: 100% 98%  100%  Weight:      Height:        General: Pt is alert, awake, not in acute distress Cardiovascular: RRR, S1/S2 +, no rubs, no gallops Respiratory: CTA bilaterally, no wheezing, no rhonchi Abdominal: Soft, NT, ND, bowel sounds + Extremities: no edema, no cyanosis    The results of significant diagnostics from this hospitalization (including imaging, microbiology, ancillary and laboratory) are listed below for reference.     Microbiology: No results found for this or any previous visit (from the past 240 hour(s)).   Labs: BNP (last 3 results) No results for input(s): BNP in the last 8760 hours. Basic Metabolic Panel: Recent Labs  Lab 11/22/17 0706 11/22/17 1405 11/23/17 0605 11/24/17 1017 11/25/17 0515 11/25/17 1729 11/26/17 0450  NA 136 136 132*  --  133*  --  136  K 3.9 4.1 3.7  --  3.0*  --  4.1  CL 95* 99* 100*  --  100*  --  105  CO2 22 21* 24  --  21*  --  22  GLUCOSE 78 112* 117*  --  93  --  91  BUN 8 8 6   --  5*  --  10  CREATININE 0.83 0.82 0.83  --  0.62  --  0.84  CALCIUM 8.9 8.4* 8.5*  --  8.6*  --  9.2  MG  --   --   --  1.4*  --  2.4  --   PHOS  --   --   --  3.1  --   --   --    Liver Function Tests: Recent Labs  Lab 11/22/17 0706 11/23/17 0605 11/24/17 1017 11/25/17 0515 11/26/17 0450  AST 369* 266* 390* 408* 471*  ALT 301* 210* 267* 304* 377*  ALKPHOS 84 61 69 70 76  BILITOT 1.2 1.3* 0.8 0.9 0.7  PROT 7.7 6.2* 6.2* 6.3* 6.5  ALBUMIN 4.1 3.3* 3.2* 3.1* 3.2*   Recent Labs  Lab 11/22/17 0706  LIPASE 21   No results for input(s): AMMONIA in the last 168 hours. CBC: Recent Labs  Lab 11/22/17 0706 11/23/17 0605 11/24/17 1017 11/25/17 0515 11/26/17 0450  WBC 3.6* 1.9* 3.1* 3.3* 3.9*  NEUTROABS  --  0.8* 1.7 1.7  --   HGB 14.1 13.2 13.5 13.1 13.3  HCT 39.5 37.2* 38.8* 37.5* 37.8*  MCV 84.6 85.5 85.5 85.6 84.4  PLT 50* PLATELET CLUMPS NOTED ON SMEAR, COUNT APPEARS DECREASED 58* 85* 120*   Cardiac Enzymes: No  results for input(s): CKTOTAL, CKMB, CKMBINDEX, TROPONINI in the last 168 hours. BNP: Invalid  input(s): POCBNP CBG: Recent Labs  Lab 11/25/17 1145 11/25/17 1755 11/26/17 0240 11/26/17 0458 11/26/17 0738  GLUCAP 88 82 111* 91 91   D-Dimer No results for input(s): DDIMER in the last 72 hours. Hgb A1c No results for input(s): HGBA1C in the last 72 hours. Lipid Profile No results for input(s): CHOL, HDL, LDLCALC, TRIG, CHOLHDL, LDLDIRECT in the last 72 hours. Thyroid function studies No results for input(s): TSH, T4TOTAL, T3FREE, THYROIDAB in the last 72 hours.  Invalid input(s): FREET3 Anemia work up No results for input(s): VITAMINB12, FOLATE, FERRITIN, TIBC, IRON, RETICCTPCT in the last 72 hours. Urinalysis    Component Value Date/Time   COLORURINE AMBER (A) 11/22/2017 1036   APPEARANCEUR CLEAR 11/22/2017 1036   LABSPEC 1.024 11/22/2017 1036   PHURINE 6.0 11/22/2017 1036   GLUCOSEU NEGATIVE 11/22/2017 1036   HGBUR SMALL (A) 11/22/2017 1036   HGBUR negative 12/22/2007 0831   BILIRUBINUR SMALL (A) 11/22/2017 1036   KETONESUR 80 (A) 11/22/2017 1036   PROTEINUR 100 (A) 11/22/2017 1036   UROBILINOGEN 1.0 06/02/2015 2032   NITRITE NEGATIVE 11/22/2017 1036   LEUKOCYTESUR NEGATIVE 11/22/2017 1036   Sepsis Labs Invalid input(s): PROCALCITONIN,  WBC,  LACTICIDVEN Microbiology No results found for this or any previous visit (from the past 240 hour(s)).   Time coordinating discharge: 32 minutes  SIGNED:   Hosie Poisson, MD  [facility] 11/26/2017, 10:59 AM Pager   If 7PM-7AM, please contact night-coverage [email or web address] Password [email or web address]

## 2017-11-26 NOTE — Progress Notes (Signed)
Clinical Social Worker following patient for support and dishcrage need. CSW reached out to [facility] to verify that patient still has a bed at facility. CSW spoke to [place] and she stated she will reach back out to CSW once she has verified bed availability.  2:30pm  Otila Kluver from [facility] reached back out to [facility] and stated that before [facility] is able to take patient back they would like a new consult for psychiatry to reevaluate patient. Otila Kluver stated that because it has been a couple of days they want to make sure inpatient psychiatry is what the patient is needing. CSW text MD to request a consult placed for psych. CSW will continue to follow for support and discharge needs.   Rhea Pink, MSW,  [place]

## 2017-11-27 DIAGNOSIS — Z87891 Personal history of nicotine dependence: Secondary | ICD-10-CM

## 2017-11-27 DIAGNOSIS — Z915 Personal history of self-harm: Secondary | ICD-10-CM

## 2017-11-27 DIAGNOSIS — F1414 Cocaine abuse with cocaine-induced mood disorder: Secondary | ICD-10-CM

## 2017-11-27 DIAGNOSIS — Z008 Encounter for other general examination: Secondary | ICD-10-CM

## 2017-11-27 LAB — GLUCOSE, CAPILLARY
GLUCOSE-CAPILLARY: 85 mg/dL (ref 65–99)
GLUCOSE-CAPILLARY: 83 mg/dL (ref 65–99)

## 2017-11-27 MED ORDER — AMLODIPINE BESYLATE 5 MG PO TABS: 5.0000 mg | ORAL_TABLET | Freq: Every day | ORAL | 0 refills | Status: DC

## 2017-11-27 MED ORDER — ADULT MULTIVITAMIN W/MINERALS CH: 1.0000 | ORAL_TABLET | Freq: Every day | ORAL | 0 refills | Status: DC

## 2017-11-27 MED ORDER — MELATONIN 5 MG PO TABS: 5.0000 mg | ORAL_TABLET | Freq: Every day | ORAL | 0 refills | Status: DC

## 2017-11-27 MED ORDER — THIAMINE HCL 100 MG PO TABS: 100.0000 mg | ORAL_TABLET | Freq: Every day | ORAL | 0 refills | Status: DC

## 2017-11-27 MED ORDER — FOLIC ACID 1 MG PO TABS: 1.0000 mg | ORAL_TABLET | Freq: Every day | ORAL | 0 refills | Status: DC

## 2017-11-27 NOTE — Consult Note (Signed)
[facility] Consult   Reason for Consult:  ''Re-evaluation'' Referring Physician:  Dr. Karleen Hampshire [name] MRN:  [id number] Principal Diagnosis: Cocaine abuse with cocaine-induced mood disorder [facility] Diagnosis:   Patient Active Problem List   Diagnosis Date Noted  . Alcohol use disorder, severe, dependence ([place]) [F10.20] 04/15/2015    Priority: High  . Severe recurrent major depressive disorder with psychotic features ([place]) [F33.3]     Priority: High  . PTSD (post-traumatic stress disorder) [F43.10] 07/06/2012    Priority: High  . Episode of recurrent major depressive disorder ([place]) [F33.9] 06/29/2012    Priority: High    Class: Acute  . Emesis [R11.10] 10/20/2017  . Displaced fracture of lateral end of left clavicle, initial encounter for closed fracture [S42.032A] 08/05/2017  . Coracoclavicular (ligament) sprain and strain, left, initial encounter [S43.82XA] 08/05/2017  . Closed dislocation of acromioclavicular joint, initial encounter [J69.678L] 08/05/2017  . Insomnia [G47.00]   . Hypertension [I10]   . Severe episode of recurrent major depressive disorder, without psychotic features ([place]) [F33.2]   . Depression, major, severe recurrence ([place]) [F33.2] 12/30/2015  . Substance induced mood disorder ([place]) [F19.94] 12/02/2015  . Mood disorder in conditions classified elsewhere [F06.30]   . Malnutrition of moderate degree [E44.0] 09/24/2015  . Alcohol withdrawal ([place]) [F10.239] 09/23/2015  . Tobacco use disorder [F17.200] 07/16/2015  . Elevated transaminase level [R74.0]   . Drug overdose, intentional ([place]) [T50.902A] 07/12/2015  . Cocaine abuse with cocaine-induced mood disorder ([place]) [F14.14] 04/11/2015  . Overdose [T50.901A] 04/10/2015  . Severe major depression with psychotic features ([place]) [F32.3] 09/11/2014  . Alcohol-induced mood disorder ([place]) [F10.94] 09/10/2014  . Suicidal ideation [R45.851]   . Tylenol overdose [T39.1X1A]   .  Polysubstance abuse ([place]) [F19.10]   . Overdose of acetaminophen [T39.1X1A] 08/03/2014  . Overdose by acetaminophen [T39.1X1A] 08/03/2014  . Cocaine abuse ([place]) [F14.10] 04/16/2014  . Thrombocytopenia ([place]) [D69.6] 04/15/2014  . Urinary tract infection, site not specified [N39.0] 04/15/2014  . Chest pain, unspecified [R07.9] 04/15/2014  . D-dimer, elevated [R79.89] 04/15/2014  . Transaminitis [R74.0] 09/24/2013  . Cocaine dependence ([place]) [F14.20] 09/20/2013  . 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 Seizure ([place]) [R56.9] 03/15/2013  . Syncope [R55] 01/02/2013  . Leukocytopenia, unspecified [D72.819] 01/02/2013  . Left kidney mass [N28.89] 12/24/2012  . Peripheral vascular disease ([place]) [I73.9] 01/14/2012  . Alcohol abuse [F10.10] 10/13/2011  . SEIZURE DISORDER [R56.9] 10/03/2008  . Elevated LFTs [R94.5] 12/29/2007  . HYPERCHOLESTEROLEMIA [E78.00] 03/21/2007  . Essential hypertension [I10] 03/21/2007    Total Time spent with patient: 45 minutes  Subjective:   [name] is a 37 y.o. male patient admitted after he overdosed on Trazodone and Listerine.  HPI: Patient with history of Depression,  Polysubstance abuse(alcohol, cocaine, tobacco),PVD, HLD, history of breast CA, renal CA, PTSD and multiple suicide attempts in the past. Patient reports that he was admitted to [facility] hospital on 11/22/17 after he attempted suicide by overdosing on 15 tabs of Trazodone and Listerine, says he was under some financial difficulty. Today, patient is alert and oriented x4 , he denies depressive symptoms, delusions, psychosis, SI/HI. He is requesting to be discharged, states that he has got a job at [employer] that he will start next week. Also, he wants to be referred to [facility] for his medication management and counseling. He stopped taking medications more than 3 months ago per his report.   Past Psychiatric History: as above  Risk to Self: Suicidal Ideation: denies Suicidal Intent: denies Is patient  at risk for suicide?: denies Suicidal Plan?: No Specify Current Suicidal Plan: denies Access to Means: denies Specify Access to Suicidal Means: none reported What has been your use of drugs/alcohol within the last 12 months?: Alcohol and crack cocaine How many times?: Other Self Harm Risks: pt denies Triggers for Past Attempts: Family contact, Unpredictable, Unknown Intentional Self Injurious Behavior: None Risk to Others: Homicidal Ideation: No Thoughts of Harm to Others: No Current Homicidal Intent: No Current Homicidal Plan: No Access to Homicidal Means: No Identified Victim: none History of harm to others?: No Assessment of Violence: None Noted Violent Behavior Description: pt denies history of violence Does patient have access to weapons?: No Criminal Charges Pending?: Yes Describe Pending Criminal Charges: misdemeanor shoplif Does patient have a court date: Yes Court Date: 01/20/18 Prior Inpatient Therapy: Prior Inpatient Therapy: Yes Prior Therapy Dates: over several years Prior Therapy Facilty/Provider(s): [facility], [facility] Reason for Treatment: MDD, SI, suicide attempts Prior Outpatient Therapy: Prior Outpatient Therapy: Yes Prior Therapy Dates: currently Prior Therapy Facilty/Provider(s): [facility] Reason for Treatment: MDD, substance abuse Does patient have an ACCT team?: No Does patient have Intensive In-House Services?  : No Does patient have [facility] services? : Yes Does patient have P4CC services?: No  Past Medical History:  Past Medical History:  Diagnosis Date  . Angina   . Anxiety    panic attack  . Bipolar 1 disorder ([place])   . Breast CA ([place]) dx'd 2009   bil w/ bil masectomy and oral meds  . Cancer [facility])    kidney cancer  . Coronary artery disease   . Depression   . H/O suicide attempt 2015   overdose  . Headache(784.0)   . Hypercholesteremia   . Hypertension   . Liver cirrhosis ([place])   . Pancreatitis   . Peripheral vascular disease  [facility]) April 2011   Left Pop  . Schizophrenia ([place])   . Seizures ([place])    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  . Shortness of breath     Past Surgical History:  Procedure Laterality Date  . BREAST SURGERY    . BREAST SURGERY     bilateral breast silocone  removal  . CHEST SURGERY    . left kidney removal    . left leg surgery     "popiteal artery clogged"  . MASTECTOMY Bilateral   . NEPHRECTOMY Left   . ORIF CLAVICULAR FRACTURE Left 08/10/2017   Procedure: OPEN REDUCTION INTERNAL FIXATION (ORIF) LEFT CLAVICLE FRACTURE WITH RECONSTRUCTION OF CORACOCLAVICULAR LIGAMENT;  Surgeon: Leandrew Koyanagi, MD;  Location: [facility];  Service: Orthopedics;  Laterality: Left;  . RECONSTRUCTION OF CORACOCLAVICULAR LIGAMENT Left 08/10/2017   Procedure: RECONSTRUCTION OF CORACOCLAVICULAR LIGAMENT;  Surgeon: Leandrew Koyanagi, MD;  Location: [facility];  Service: Orthopedics;  Laterality: Left;   Family History:  Family History  Problem Relation Age of Onset  . Stroke Other   . Cancer Other   . Hyperlipidemia Mother   . Hypertension Mother    Family Psychiatric  History: Social History:  Social History   Substance and Sexual Activity  Alcohol Use Yes     Social History   Substance and Sexual Activity  Drug Use No  . Frequency: 1.0 times per week  . Types: "Crack" cocaine, Cocaine    Social History   Socioeconomic History  . Marital status: Single    Spouse name: Not on file  . Number of children: Not on file  . Years of education: Not on file  .  Highest education level: Not on file  Occupational History  . Not on file  Social Needs  . Financial resource strain: Not on file  . Food insecurity:    Worry: Not on file    Inability: Not on file  . Transportation needs:    Medical: Not on file    Non-medical: Not on file  Tobacco Use  . Smoking status: Former Research scientist (life sciences)  . Smokeless tobacco: Never Used  . Tobacco comment: smoked on and off 16 years- quit 2016  Substance and Sexual Activity  .  Alcohol use: Yes  . Drug use: No    Frequency: 1.0 times per week    Types: "Crack" cocaine, Cocaine  . Sexual activity: Yes    Birth control/protection: Condom    Comment: anal  Lifestyle  . Physical activity:    Days per week: Not on file    Minutes per session: Not on file  . Stress: Not on file  Relationships  . Social connections:    Talks on phone: Not on file    Gets together: Not on file    Attends religious service: Not on file    Active member of club or organization: Not on file    Attends meetings of clubs or organizations: Not on file    Relationship status: Not on file  Other Topics Concern  . Not on file  Social History Narrative   ** Merged History Encounter **       ** Merged History Encounter **       Additional Social History:    Allergies:   Allergies  Allergen Reactions  . Codeine Hives, Itching, Swelling and Other (See Comments)    Does not impair breathing, however  . Penicillins Swelling and Other (See Comments)    Has patient had a PCN reaction causing immediate rash, facial/tongue/throat swelling, SOB or lightheadedness with hypotension: Yes Has patient had a PCN reaction causing severe rash involving mucus membranes or skin necrosis: Yes Has patient had a PCN reaction that required hospitalization Yes-ed visit Has patient had a PCN reaction occurring within the last 10 years: Yes If all of the above answers are "NO", then may proceed with Cephalosporin use.   Marland Kitchen Morphine Itching  . Coconut Flavor Itching and Swelling  . Coconut Oil Other (See Comments)    Cannot take with some of his meds  . Grapefruit Concentrate Other (See Comments)    Cannot take with some of his meds  . Morphine And Related Itching and Swelling  . Oxycodone Itching and Swelling  . Norco [Hydrocodone-Acetaminophen] Itching and Rash    Labs:  Results for orders placed or performed during the hospital encounter of 11/22/17 (from the past 48 hour(s))  Magnesium      Status: None   Collection Time: 11/25/17  5:29 PM  Result Value Ref Range   Magnesium 2.4 1.7 - 2.4 mg/dL    Comment: Performed at [facility], [place] [address] [place], [place] [address]  Glucose, capillary     Status: None   Collection Time: 11/25/17  5:55 PM  Result Value Ref Range   Glucose-Capillary 82 65 - 99 mg/dL  Glucose, capillary     Status: Abnormal   Collection Time: 11/26/17  2:40 AM  Result Value Ref Range   Glucose-Capillary 111 (H) 65 - 99 mg/dL   Comment 1 Notify RN    Comment 2 Document in Chart   CBC     Status: Abnormal  Collection Time: 11/26/17  4:50 AM  Result Value Ref Range   WBC 3.9 (L) 4.0 - 10.5 K/uL   RBC 4.48 4.22 - 5.81 MIL/uL   Hemoglobin 13.3 13.0 - 17.0 g/dL   HCT 37.8 (L) 39.0 - 52.0 %   MCV 84.4 78.0 - 100.0 fL   MCH 29.7 26.0 - 34.0 pg   MCHC 35.2 30.0 - 36.0 g/dL   RDW 14.8 11.5 - 15.5 %   Platelets 120 (L) 150 - 400 K/uL    Comment: Performed at [facility], [place] [address], [place], [place] [address]  Basic metabolic panel     Status: None   Collection Time: 11/26/17  4:50 AM  Result Value Ref Range   Sodium 136 135 - 145 mmol/L   Potassium 4.1 3.5 - 5.1 mmol/L    Comment: SLIGHT HEMOLYSIS DELTA CHECK NOTED    Chloride 105 101 - 111 mmol/L   CO2 22 22 - 32 mmol/L   Glucose, Bld 91 65 - 99 mg/dL   BUN 10 6 - 20 mg/dL   Creatinine, Ser 0.84 0.61 - 1.24 mg/dL   Calcium 9.2 8.9 - 10.3 mg/dL   GFR calc non Af Amer >60 >60 mL/min   GFR calc Af Amer >60 >60 mL/min    Comment: (NOTE) The eGFR has been calculated using the CKD EPI equation. This calculation has not been validated in all clinical situations. eGFR's persistently <60 mL/min signify possible Chronic Kidney Disease.    Anion gap 9 5 - 15    Comment: Performed at [facility], [place] [address], [place], [place] [address]  Hepatic function panel     Status: Abnormal   Collection Time: 11/26/17  4:50 AM  Result Value  Ref Range   Total Protein 6.5 6.5 - 8.1 g/dL   Albumin 3.2 (L) 3.5 - 5.0 g/dL   AST 471 (H) 15 - 41 U/L   ALT 377 (H) 17 - 63 U/L   Alkaline Phosphatase 76 38 - 126 U/L   Total Bilirubin 0.7 0.3 - 1.2 mg/dL   Bilirubin, Direct 0.1 0.1 - 0.5 mg/dL   Indirect Bilirubin 0.6 0.3 - 0.9 mg/dL    Comment: Performed at [facility], [place] [address], [place], [place] [address]  Glucose, capillary     Status: None   Collection Time: 11/26/17  4:58 AM  Result Value Ref Range   Glucose-Capillary 91 65 - 99 mg/dL   Comment 1 Notify RN    Comment 2 Document in Chart   Glucose, capillary     Status: None   Collection Time: 11/26/17  7:38 AM  Result Value Ref Range   Glucose-Capillary 91 65 - 99 mg/dL  Glucose, capillary     Status: None   Collection Time: 11/26/17 11:33 AM  Result Value Ref Range   Glucose-Capillary 94 65 - 99 mg/dL  Glucose, capillary     Status: None   Collection Time: 11/26/17  3:51 PM  Result Value Ref Range   Glucose-Capillary 93 65 - 99 mg/dL  Glucose, capillary     Status: Abnormal   Collection Time: 11/26/17  8:14 PM  Result Value Ref Range   Glucose-Capillary 111 (H) 65 - 99 mg/dL  Glucose, capillary     Status: None   Collection Time: 11/27/17  4:47 AM  Result Value Ref Range   Glucose-Capillary 85 65 - 99 mg/dL  Glucose, capillary     Status: None  Collection Time: 11/27/17  8:21 AM  Result Value Ref Range   Glucose-Capillary 83 65 - 99 mg/dL    Current Facility-Administered Medications  Medication Dose Route Frequency Provider Last Rate Last Dose  . amLODipine (NORVASC) tablet 5 mg  5 mg Oral Daily Alma Friendly, MD   5 mg at 11/27/17 1053  . folic acid (FOLVITE) tablet 1 mg  1 mg Oral Daily Alma Friendly, MD   1 mg at 11/27/17 1053  . Melatonin TABS 5 mg  5 mg Oral QHS Alma Friendly, MD   5 mg at 11/26/17 2215  . multivitamin with minerals tablet 1 tablet  1 tablet Oral Once Doristine Devoid, PA-C      .  multivitamin with minerals tablet 1 tablet  1 tablet Oral Daily Alma Friendly, MD   1 tablet at 11/27/17 1053  . pantoprazole (PROTONIX) EC tablet 40 mg  40 mg Oral Daily Ocie Cornfield T, PA-C   40 mg at 11/26/17 1005  . polyethylene glycol (MIRALAX / GLYCOLAX) packet 17 g  17 g Oral Daily PRN Alma Friendly, MD      . sodium chloride flush (NS) 0.9 % injection 3 mL  3 mL Intravenous Q12H Alma Friendly, MD   3 mL at 11/27/17 1055  . thiamine (VITAMIN B-1) tablet 100 mg  100 mg Oral Daily Alma Friendly, MD   100 mg at 11/27/17 1053   Or  . thiamine (B-1) injection 100 mg  100 mg Intravenous Daily Alma Friendly, MD        Musculoskeletal: Strength & Muscle Tone: within normal limits Gait & Station: normal Patient leans: N/A  Psychiatric Specialty Exam: Physical Exam  Psychiatric: He has a normal mood and affect. His speech is normal. Judgment normal. Cognition and memory are normal.    Review of Systems  Constitutional: Negative.   HENT: Negative.   Eyes: Negative.   Gastrointestinal: Negative.   Skin: Negative.   Neurological: Negative.   Endo/Heme/Allergies: Negative.   Psychiatric/Behavioral: Positive for substance abuse.    Blood pressure (!) 139/98, pulse 84, temperature 98.5 F (36.9 C), temperature source Oral, resp. rate 15, height '5\' 6"'  (1.676 m), weight 68 kg (150 lb), SpO2 100 %.Body mass index is 24.21 kg/m.  General Appearance: Casual  Eye Contact:  Good  Speech:  Clear and Coherent  Volume:  Normal  Mood:  Euthymic  Affect:  Appropriate  Thought Process:  Coherent  Orientation:  Full (Time, Place, and Person)  Thought Content:  Logical  Suicidal Thoughts:  No  Homicidal Thoughts:  No  Memory:  Immediate;   Good Recent;   Good Remote;   Good  Judgement:  Intact  Insight:  Fair  Psychomotor Activity:  Psychomotor Retardation  Concentration:  Concentration: Good and Attention Span: Good  Recall:  Good  Fund of Knowledge:   Good  Language:  Good  Akathisia:  No  Handed:  Right  AIMS (if indicated):     Assets:  Communication Skills Desire for Improvement  ADL's:  Intact  Cognition:  WNL  Sleep:   good     Treatment Plan Summary: Patient with long history of mental illness and polysubstance dependence who was admitted to the hospital after he attempted suicide by overdosing on Trazodone and Listerine. Patient now reports been stable after spending 5 days in 4th floor. He denies SI/HI and requesting to be discharged so that he can be receive treatment at [facility]  on outpatient basis. Plan/Recommendations: -Patient is cleared for discharge by psychiatric service. -Needs referral to [facility] for medication management/counseling upon discharge.  Disposition: No evidence of imminent risk to self or others at present.   Patient does not meet criteria for psychiatric inpatient admission. Supportive therapy provided about ongoing stressors. Unit social worker to assist with referrin patient to [facility] for psychiatric after care.  Corena Pilgrim, MD 11/27/2017 12:23 PM

## 2017-11-27 NOTE — Progress Notes (Signed)
Discharge instructions reviewed with patient utilizing teach back method no questions at this time. Patient being discharge to home when ride arrives

## 2017-11-27 NOTE — Progress Notes (Signed)
CSW received consult that pt is needing outpatient mental health follow up through [facility].  CSW met with pt- he reports that he has used [facility] in the past and had an appointment to go in prior to this admission. Pt plans to follow up with [facility] now that he is being discharged.  CSW placed [place] contact info on pt AVS  CSW also provided pt with bus passes to get home  CSW signing off  Jorge Ny, [place] Social Worker [phone]

## 2017-11-28 LAB — GLUCOSE, CAPILLARY
GLUCOSE-CAPILLARY: 101 mg/dL — AB (ref 65–99)
Glucose-Capillary: 80 mg/dL (ref 65–99)

## 2017-12-12 ENCOUNTER — Inpatient Hospital Stay (HOSPITAL_COMMUNITY)
Admission: EM | Admit: 2017-12-12 | Discharge: 2017-12-15 | DRG: 897 | Disposition: A | Discharge status: Home / Self Care | Payer: Self-pay | Attending: Internal Medicine | Admitting: Internal Medicine

## 2017-12-12 ENCOUNTER — Emergency Department (HOSPITAL_COMMUNITY): Payer: Self-pay

## 2017-12-12 ENCOUNTER — Other Ambulatory Visit: Payer: Self-pay

## 2017-12-12 ENCOUNTER — Inpatient Hospital Stay (HOSPITAL_COMMUNITY): Payer: Self-pay

## 2017-12-12 ENCOUNTER — Encounter (HOSPITAL_COMMUNITY): Payer: Self-pay | Admitting: *Deleted

## 2017-12-12 DIAGNOSIS — Z880 Allergy status to penicillin: Secondary | ICD-10-CM

## 2017-12-12 DIAGNOSIS — F10939 Alcohol use, unspecified with withdrawal, unspecified: Secondary | ICD-10-CM | POA: Diagnosis present

## 2017-12-12 DIAGNOSIS — Z915 Personal history of self-harm: Secondary | ICD-10-CM

## 2017-12-12 DIAGNOSIS — G4700 Insomnia, unspecified: Secondary | ICD-10-CM | POA: Diagnosis present

## 2017-12-12 DIAGNOSIS — F1090 Alcohol use, unspecified, uncomplicated: Secondary | ICD-10-CM | POA: Diagnosis present

## 2017-12-12 DIAGNOSIS — F10230 Alcohol dependence with withdrawal, uncomplicated: Secondary | ICD-10-CM

## 2017-12-12 DIAGNOSIS — Z87891 Personal history of nicotine dependence: Secondary | ICD-10-CM

## 2017-12-12 DIAGNOSIS — F1410 Cocaine abuse, uncomplicated: Secondary | ICD-10-CM | POA: Diagnosis present

## 2017-12-12 DIAGNOSIS — F410 Panic disorder [episodic paroxysmal anxiety] without agoraphobia: Secondary | ICD-10-CM | POA: Diagnosis present

## 2017-12-12 DIAGNOSIS — R740 Nonspecific elevation of levels of transaminase and lactic acid dehydrogenase [LDH]: Secondary | ICD-10-CM

## 2017-12-12 DIAGNOSIS — I739 Peripheral vascular disease, unspecified: Secondary | ICD-10-CM | POA: Diagnosis present

## 2017-12-12 DIAGNOSIS — F14288 Cocaine dependence with other cocaine-induced disorder: Secondary | ICD-10-CM | POA: Diagnosis present

## 2017-12-12 DIAGNOSIS — F209 Schizophrenia, unspecified: Secondary | ICD-10-CM | POA: Diagnosis present

## 2017-12-12 DIAGNOSIS — R569 Unspecified convulsions: Secondary | ICD-10-CM

## 2017-12-12 DIAGNOSIS — Z8249 Family history of ischemic heart disease and other diseases of the circulatory system: Secondary | ICD-10-CM

## 2017-12-12 DIAGNOSIS — D6959 Other secondary thrombocytopenia: Secondary | ICD-10-CM | POA: Diagnosis present

## 2017-12-12 DIAGNOSIS — Z905 Acquired absence of kidney: Secondary | ICD-10-CM

## 2017-12-12 DIAGNOSIS — F10930 Alcohol use, unspecified with withdrawal, uncomplicated: Secondary | ICD-10-CM

## 2017-12-12 DIAGNOSIS — F1010 Alcohol abuse, uncomplicated: Secondary | ICD-10-CM

## 2017-12-12 DIAGNOSIS — E7800 Pure hypercholesterolemia, unspecified: Secondary | ICD-10-CM

## 2017-12-12 DIAGNOSIS — Z8349 Family history of other endocrine, nutritional and metabolic diseases: Secondary | ICD-10-CM

## 2017-12-12 DIAGNOSIS — Z79899 Other long term (current) drug therapy: Secondary | ICD-10-CM

## 2017-12-12 DIAGNOSIS — Z9013 Acquired absence of bilateral breasts and nipples: Secondary | ICD-10-CM

## 2017-12-12 DIAGNOSIS — K7460 Unspecified cirrhosis of liver: Secondary | ICD-10-CM | POA: Diagnosis present

## 2017-12-12 DIAGNOSIS — K760 Fatty (change of) liver, not elsewhere classified: Secondary | ICD-10-CM | POA: Diagnosis present

## 2017-12-12 DIAGNOSIS — Z9119 Patient's noncompliance with other medical treatment and regimen: Secondary | ICD-10-CM

## 2017-12-12 DIAGNOSIS — D696 Thrombocytopenia, unspecified: Secondary | ICD-10-CM | POA: Diagnosis present

## 2017-12-12 DIAGNOSIS — F17200 Nicotine dependence, unspecified, uncomplicated: Secondary | ICD-10-CM | POA: Diagnosis present

## 2017-12-12 DIAGNOSIS — I4581 Long QT syndrome: Secondary | ICD-10-CM | POA: Diagnosis present

## 2017-12-12 DIAGNOSIS — K921 Melena: Secondary | ICD-10-CM | POA: Diagnosis present

## 2017-12-12 DIAGNOSIS — Z818 Family history of other mental and behavioral disorders: Secondary | ICD-10-CM

## 2017-12-12 DIAGNOSIS — F4310 Post-traumatic stress disorder, unspecified: Secondary | ICD-10-CM | POA: Diagnosis present

## 2017-12-12 DIAGNOSIS — F332 Major depressive disorder, recurrent severe without psychotic features: Secondary | ICD-10-CM | POA: Diagnosis present

## 2017-12-12 DIAGNOSIS — Z85528 Personal history of other malignant neoplasm of kidney: Secondary | ICD-10-CM

## 2017-12-12 DIAGNOSIS — Z853 Personal history of malignant neoplasm of breast: Secondary | ICD-10-CM

## 2017-12-12 DIAGNOSIS — Z885 Allergy status to narcotic agent status: Secondary | ICD-10-CM

## 2017-12-12 DIAGNOSIS — I2510 Atherosclerotic heart disease of native coronary artery without angina pectoris: Secondary | ICD-10-CM | POA: Diagnosis present

## 2017-12-12 DIAGNOSIS — E785 Hyperlipidemia, unspecified: Secondary | ICD-10-CM | POA: Diagnosis present

## 2017-12-12 DIAGNOSIS — F10239 Alcohol dependence with withdrawal, unspecified: Principal | ICD-10-CM | POA: Diagnosis present

## 2017-12-12 DIAGNOSIS — Z91018 Allergy to other foods: Secondary | ICD-10-CM

## 2017-12-12 DIAGNOSIS — Z823 Family history of stroke: Secondary | ICD-10-CM

## 2017-12-12 DIAGNOSIS — I10 Essential (primary) hypertension: Secondary | ICD-10-CM | POA: Diagnosis present

## 2017-12-12 DIAGNOSIS — R7401 Elevation of levels of liver transaminase levels: Secondary | ICD-10-CM | POA: Diagnosis present

## 2017-12-12 DIAGNOSIS — F1024 Alcohol dependence with alcohol-induced mood disorder: Secondary | ICD-10-CM | POA: Diagnosis present

## 2017-12-12 LAB — CBC WITH DIFFERENTIAL/PLATELET
BASOS ABS: 0 10*3/uL (ref 0.0–0.1)
BASOS PCT: 0 %
EOS PCT: 0 %
Eosinophils Absolute: 0 10*3/uL (ref 0.0–0.7)
HCT: 41.4 % (ref 39.0–52.0)
HEMOGLOBIN: 14.7 g/dL (ref 13.0–17.0)
LYMPHS ABS: 0.6 10*3/uL — AB (ref 0.7–4.0)
LYMPHS PCT: 12 %
MCH: 30.8 pg (ref 26.0–34.0)
MCHC: 35.5 g/dL (ref 30.0–36.0)
MCV: 86.8 fL (ref 78.0–100.0)
MONOS PCT: 6 %
Monocytes Absolute: 0.3 10*3/uL (ref 0.1–1.0)
NEUTROS ABS: 3.9 10*3/uL (ref 1.7–7.7)
Neutrophils Relative %: 82 %
Platelets: 110 10*3/uL — ABNORMAL LOW (ref 150–400)
RBC: 4.77 MIL/uL (ref 4.22–5.81)
RDW: 15.6 % — AB (ref 11.5–15.5)
WBC: 4.8 10*3/uL (ref 4.0–10.5)

## 2017-12-12 LAB — COMPREHENSIVE METABOLIC PANEL
ALK PHOS: 71 U/L (ref 38–126)
ALT: 152 U/L — ABNORMAL HIGH (ref 17–63)
ANION GAP: 14 (ref 5–15)
AST: 147 U/L — ABNORMAL HIGH (ref 15–41)
Albumin: 4.4 g/dL (ref 3.5–5.0)
BUN: 13 mg/dL (ref 6–20)
CALCIUM: 9.6 mg/dL (ref 8.9–10.3)
CO2: 23 mmol/L (ref 22–32)
Chloride: 97 mmol/L — ABNORMAL LOW (ref 101–111)
Creatinine, Ser: 0.95 mg/dL (ref 0.61–1.24)
GLUCOSE: 112 mg/dL — AB (ref 65–99)
POTASSIUM: 4.5 mmol/L (ref 3.5–5.1)
Sodium: 134 mmol/L — ABNORMAL LOW (ref 135–145)
TOTAL PROTEIN: 8.2 g/dL — AB (ref 6.5–8.1)
Total Bilirubin: 1.4 mg/dL — ABNORMAL HIGH (ref 0.3–1.2)

## 2017-12-12 LAB — URINALYSIS, ROUTINE W REFLEX MICROSCOPIC
Bilirubin Urine: NEGATIVE
Glucose, UA: NEGATIVE mg/dL
KETONES UR: 20 mg/dL — AB
Leukocytes, UA: NEGATIVE
Nitrite: NEGATIVE
PROTEIN: 100 mg/dL — AB
Specific Gravity, Urine: 1.018 (ref 1.005–1.030)
pH: 7 (ref 5.0–8.0)

## 2017-12-12 LAB — PROTIME-INR
INR: 0.97
PROTHROMBIN TIME: 12.8 s (ref 11.4–15.2)

## 2017-12-12 LAB — POC OCCULT BLOOD, ED: Fecal Occult Bld: POSITIVE — AB

## 2017-12-12 LAB — TYPE AND SCREEN
ABO/RH(D): O POS
ANTIBODY SCREEN: NEGATIVE

## 2017-12-12 LAB — RAPID URINE DRUG SCREEN, HOSP PERFORMED
Amphetamines: NOT DETECTED
BARBITURATES: NOT DETECTED
Benzodiazepines: NOT DETECTED
Cocaine: POSITIVE — AB
OPIATES: NOT DETECTED
Tetrahydrocannabinol: NOT DETECTED

## 2017-12-12 LAB — ACETAMINOPHEN LEVEL

## 2017-12-12 LAB — SALICYLATE LEVEL: Salicylate Lvl: <7 mg/dL (ref 2.8–30.0)

## 2017-12-12 LAB — ETHANOL

## 2017-12-12 IMAGING — DX DG CHEST 1V PORT
1 series · 1 of 1 positions shown · non-contrast
Comparison: Chest CT dated 06/02/2015

CLINICAL DATA: 34-year-old male found on the floor status post
intubation

EXAM:
PORTABLE CHEST 1 VIEW

[chest ap]
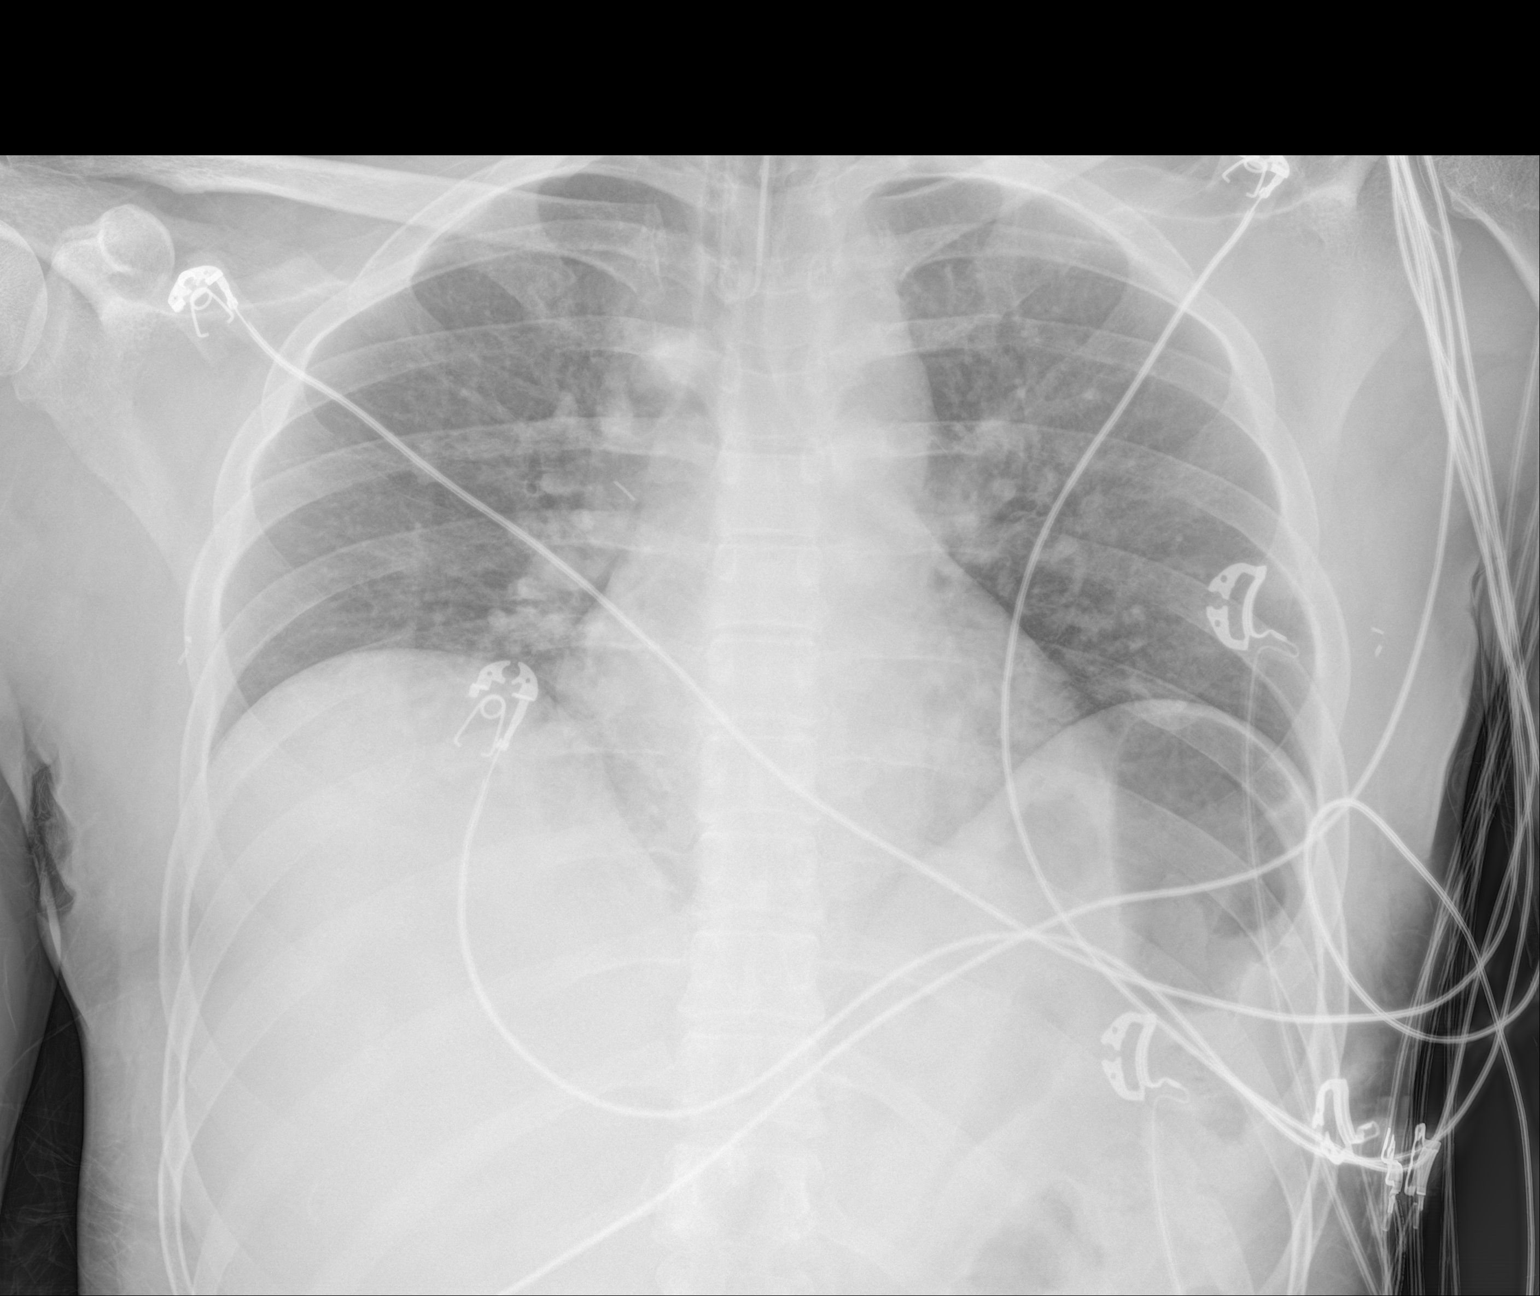

[1 of 1 positions shown; findings below may reference images not displayed]

FINDINGS: Endotracheal tube with tip approximately 3 cm above the carina.
Single-view of the chest does not demonstrate any consolidation. No
pleural effusion or pneumothorax. The cardiac silhouette is within
normal limits. The osseous structures appear unremarkable.
IMPRESSION: Endotracheal tube above the carina.

## 2017-12-12 MED ORDER — DIPHENHYDRAMINE HCL 25 MG PO CAPS
25.0000 mg | ORAL_CAPSULE | Freq: Four times a day (QID) | ORAL | Status: DC | PRN
  Administered 2017-12-13: 25 mg via ORAL
  Filled 2017-12-12: qty 1

## 2017-12-12 MED ORDER — LORAZEPAM 1 MG PO TABS: 0.0000 mg | ORAL_TABLET | Freq: Four times a day (QID) | ORAL | Status: DC

## 2017-12-12 MED ORDER — LORAZEPAM 1 MG PO TABS: 0.0000 mg | ORAL_TABLET | Freq: Two times a day (BID) | ORAL | Status: DC

## 2017-12-12 MED ORDER — LORAZEPAM 2 MG/ML IJ SOLN: 0.0000 mg | Freq: Four times a day (QID) | INTRAMUSCULAR | Status: DC

## 2017-12-12 MED ORDER — SODIUM CHLORIDE 0.9 % IV BOLUS
1000.0000 mL | Freq: Once | INTRAVENOUS | Status: AC
  Administered 2017-12-12: 1000 mL via INTRAVENOUS

## 2017-12-12 MED ORDER — VITAMIN B-1 100 MG PO TABS
100.0000 mg | ORAL_TABLET | Freq: Every day | ORAL | Status: DC
  Administered 2017-12-13 – 2017-12-15 (×3): 100 mg via ORAL
  Filled 2017-12-12 (×3): qty 1

## 2017-12-12 MED ORDER — LORAZEPAM 2 MG/ML IJ SOLN
2.0000 mg | Freq: Once | INTRAMUSCULAR | Status: AC
  Administered 2017-12-12: 2 mg via INTRAVENOUS
  Filled 2017-12-12: qty 1

## 2017-12-12 MED ORDER — LORAZEPAM 2 MG/ML IJ SOLN: 0.0000 mg | Freq: Two times a day (BID) | INTRAMUSCULAR | Status: DC

## 2017-12-12 MED ORDER — THIAMINE HCL 100 MG/ML IJ SOLN
100.0000 mg | Freq: Every day | INTRAMUSCULAR | Status: DC
  Administered 2017-12-12: 100 mg via INTRAVENOUS
  Filled 2017-12-12: qty 2

## 2017-12-12 NOTE — H&P (Signed)
[name] QAS:[id number] DOB: [birth year] DOA: 12/12/2017     PCP: Patient, No Pcp Per   Outpatient Specialists:  NONE   Patient arrived to ER on 12/12/17 at 1702  Patient coming from:   home Lives   With roomate    Chief Complaint:  Chief Complaint  Patient presents with  . Seizures    HPI: [name] is a 37 y.o. male with medical history significant of polysubstance abuse(alcohol, cocaine, tobacco),PVD, HLD, history of breast CA, renal CA, PTSD, depression with history of multiple suicide attempts, schizophrenia, bipolar disorder, CAD       Presented with   desire to be detoxed from alcohol with this plan to place him in facility but had alcohol withdrawal seizure today EMS was called heart rate 120 blood pressure 150/100 CBG 116.  Had some cough and vomiting after the seizure.  No headaches no visual disturbances no chest pain no shortness of breath.  Episode was witnessed.  Described as jerking rhythmic. Apparently had patient have had alcohol withdrawal seizures in the past in 2017. He drinks about 3 bottles of wine a day and today decided to quit last alcoholic drink was this morning.  But started to get very shaky diaphoretic had some nausea and vomiting and started to have hallucinations.  Patient does endorse having melena for the past few weeks and have had prior GI bleeds which was similar. Reports today he hit his head on the floor and its still huts severely.  denies suicidal ideation.  Last time was admitted on 7 May secondary to suicide attempt when he attempted to take 15 pills of trazodone a large amount of Listerine Regarding pertinent Chronic problems: 2 breast cancer status post mastectomy History of peripheral vascular disease requiring femoral popliteal bypass History of renal mass status post partial left nephrectomy While in ER: Initial CIWA protocol scale was 21 Now feeling better but reports feeling itchy similar to prior withdrawal  Following  Medications were ordered in ER: Medications  LORazepam (ATIVAN) injection 0-4 mg (has no administration in time range)    Or  LORazepam (ATIVAN) tablet 0-4 mg (has no administration in time range)  LORazepam (ATIVAN) injection 0-4 mg (has no administration in time range)    Or  LORazepam (ATIVAN) tablet 0-4 mg (has no administration in time range)  thiamine (VITAMIN B-1) tablet 100 mg ( Oral See Alternative 12/12/17 1849)    Or  thiamine (B-1) injection 100 mg (100 mg Intravenous Given 12/12/17 1849)  LORazepam (ATIVAN) injection 2 mg (2 mg Intravenous Given 12/12/17 1843)  sodium chloride 0.9 % bolus 1,000 mL (1,000 mLs Intravenous New Bag/Given 12/12/17 1848)    Significant initial  Findings: Abnormal Labs Reviewed  CBC WITH DIFFERENTIAL/PLATELET - Abnormal; Notable for the following components:      Result Value   RDW 15.6 (*)    Platelets 110 (*)    Lymphs Abs 0.6 (*)    All other components within normal limits  COMPREHENSIVE METABOLIC PANEL - Abnormal; Notable for the following components:   Sodium 134 (*)    Chloride 97 (*)    Glucose, Bld 112 (*)    Total Protein 8.2 (*)    AST 147 (*)    ALT 152 (*)    Total Bilirubin 1.4 (*)    All other components within normal limits  URINALYSIS, ROUTINE W REFLEX MICROSCOPIC - Abnormal; Notable for the following components:   Hgb urine dipstick SMALL (*)    Ketones, ur  20 (*)    Protein, ur 100 (*)    Bacteria, UA RARE (*)    All other components within normal limits  RAPID URINE DRUG SCREEN, HOSP PERFORMED - Abnormal; Notable for the following components:   Cocaine POSITIVE (*)    All other components within normal limits  ACETAMINOPHEN LEVEL - Abnormal; Notable for the following components:   Acetaminophen (Tylenol), Serum <10 (*)    All other components within normal limits     Na 134 K 4.5  Prot 8.2  Cr    stable,  Up from baseline see below Lab Results  Component Value Date   CREATININE 0.95 12/12/2017   CREATININE  0.84 11/26/2017   CREATININE 0.62 11/25/2017    INR 0.97  WBC  4.8  HG/HCT  stable,       Component Value Date/Time   HGB 14.7 12/12/2017 1841   HCT 41.4 12/12/2017 1841     Lactic Acid, Venous    Component Value Date/Time   LATICACIDVEN 1.0 10/20/2017 1951      UA  no evidence of UTI      CXR -  NON acute     ECG:  Personally reviewed by me showing: HR : 120 Rhythm: sinus tachycardia   no evidence of ischemic changes QTC 532     ED Triage Vitals  Enc Vitals Group     BP 12/12/17 1724 (!) 164/146     Pulse Rate 12/12/17 1724 (!) 115     Resp 12/12/17 1724 16     Temp 12/12/17 1724 98.5 F (36.9 C)     Temp Source 12/12/17 1724 Oral     SpO2 12/12/17 1724 98 %     Weight 12/12/17 1725 150 lb (68 kg)     Height 12/12/17 1725 5\' 6"  (1.676 m)     Head Circumference --      Peak Flow --      Pain Score 12/12/17 1725 8     Pain Loc --      Pain Edu? --      Excl. in [place]? --   TMAX(24)@       Latest  Blood pressure (!) 146/101, pulse 92, temperature 98.5 F (36.9 C), temperature source Oral, resp. rate 17, height 5\' 6"  (1.676 m), weight 68 kg (150 lb), SpO2 97 %.   Hospitalist was called for admission for alcohol withdrawal seizures   Review of Systems:    Pertinent positives include: seizures, hallucinations nausea, vomiting, melena chills Constitutional:  No weight loss, night sweats, Fevers,, fatigue, weight loss  HEENT:  No headaches, Difficulty swallowing,Tooth/dental problems,Sore throat,  No sneezing, itching, ear ache, nasal congestion, post nasal drip,  Cardio-vascular:  No chest pain, Orthopnea, PND, anasarca, dizziness, palpitations.no Bilateral lower extremity swelling  GI:  No heartburn, indigestion, abdominal pain,  diarrhea, change in bowel habits, loss of appetite, , blood in stool, hematemesis Resp:  no shortness of breath at rest. No dyspnea on exertion, No excess mucus, no productive cough, No non-productive cough, No coughing up  of blood.No change in color of mucus.No wheezing. Skin:  no rash or lesions. No jaundice GU:  no dysuria, change in color of urine, no urgency or frequency. No straining to urinate.  No flank pain.  Musculoskeletal:  No joint pain or no joint swelling. No decreased range of motion. No back pain.  Psych:  No change in mood or affect. No depression or anxiety. No memory loss.  Neuro: no localizing neurological  complaints, no tingling, no weakness, no double vision, no gait abnormality, no slurred speech,   As per HPI otherwise 10 point review of systems negative.   Past Medical History:   Past Medical History:  Diagnosis Date  . Angina   . Anxiety    panic attack  . Bipolar 1 disorder ([place])   . Breast CA ([place]) dx'd 2009   bil w/ bil masectomy and oral meds  . Cancer [facility])    kidney cancer  . Coronary artery disease   . Depression   . H/O suicide attempt 2015   overdose  . Headache(784.0)   . Hypercholesteremia   . Hypertension   . Liver cirrhosis ([place])   . Pancreatitis   . Peripheral vascular disease [facility]) April 2011   Left Pop  . Schizophrenia ([place])   . Seizures ([place])    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  . Shortness of breath       Past Surgical History:  Procedure Laterality Date  . BREAST SURGERY    . BREAST SURGERY     bilateral breast silocone  removal  . CHEST SURGERY    . left kidney removal    . left leg surgery     "popiteal artery clogged"  . MASTECTOMY Bilateral   . NEPHRECTOMY Left   . ORIF CLAVICULAR FRACTURE Left 08/10/2017   Procedure: OPEN REDUCTION INTERNAL FIXATION (ORIF) LEFT CLAVICLE FRACTURE WITH RECONSTRUCTION OF CORACOCLAVICULAR LIGAMENT;  Surgeon: Leandrew Koyanagi, MD;  Location: [facility];  Service: Orthopedics;  Laterality: Left;  . RECONSTRUCTION OF CORACOCLAVICULAR LIGAMENT Left 08/10/2017   Procedure: RECONSTRUCTION OF CORACOCLAVICULAR LIGAMENT;  Surgeon: Leandrew Koyanagi, MD;  Location: [facility];  Service: Orthopedics;  Laterality: Left;     Social History:  Ambulatory  independently       reports that he has quit smoking. He has never used smokeless tobacco. He reports that he drinks alcohol. He reports that he does not use drugs.     Family History:   Family History  Problem Relation Age of Onset  . Stroke Other   . Cancer Other   . Hyperlipidemia Mother   . Hypertension Mother     Allergies: Allergies  Allergen Reactions  . Codeine Hives, Itching, Swelling and Other (See Comments)    Does not impair breathing, however  . Penicillins Swelling and Other (See Comments)    Has patient had a PCN reaction causing immediate rash, facial/tongue/throat swelling, SOB or lightheadedness with hypotension: Yes Has patient had a PCN reaction causing severe rash involving mucus membranes or skin necrosis: Yes Has patient had a PCN reaction that required hospitalization Yes-ed visit Has patient had a PCN reaction occurring within the last 10 years: Yes If all of the above answers are "NO", then may proceed with Cephalosporin use.   Marland Kitchen Morphine Itching  . Coconut Flavor Itching and Swelling  . Coconut Oil Other (See Comments)    Cannot take with some of his meds  . Grapefruit Concentrate Other (See Comments)    Cannot take with some of his meds  . Morphine And Related Itching and Swelling  . Oxycodone Itching and Swelling  . Norco [Hydrocodone-Acetaminophen] Itching and Rash     Prior to Admission medications   Medication Sig Start Date End Date Taking? Authorizing Provider  amLODipine (NORVASC) 5 MG tablet Take 1 tablet (5 mg total) by mouth daily. 11/28/17  Yes Hosie Poisson, MD  folic acid (FOLVITE) 1 MG tablet Take 1 tablet (1 mg  total) by mouth daily. 11/27/17  Yes Hosie Poisson, MD  gabapentin (NEURONTIN) 300 MG capsule Take 1 capsule (300 mg total) by mouth 3 (three) times daily. 11/03/17  Yes [name], NP  Melatonin 5 MG TABS Take 1-2 tablets (5-10 mg total) by mouth at bedtime. 11/27/17  Yes Hosie Poisson, MD  Multiple Vitamin (MULTIVITAMIN WITH MINERALS) TABS tablet Take 1 tablet by mouth daily. 11/27/17  Yes Hosie Poisson, MD  thiamine 100 MG tablet Take 1 tablet (100 mg total) by mouth daily. 11/27/17  Yes Hosie Poisson, MD   Physical Exam: Blood pressure (!) 146/101, pulse 92, temperature 98.5 F (36.9 C), temperature source Oral, resp. rate 17, height 5\' 6"  (1.676 m), weight 68 kg (150 lb), SpO2 97 %. 1. General:  in No Acute distress  well  -appearing 2. Psychological: Alert and  Oriented 3. Head/ENT:     Dry Mucous Membranes                          Head Non traumatic, neck supple                            Poor Dentition 4. SKIN:  decreased Skin turgor,  Skin clean Dry and intact no rash 5. Heart: Regular rate and rhythm no   Murmur, no Rub or gallop 6. Lungs:  no wheezes or crackles   7. Abdomen: Soft, RUQ tendernes   Non distended  bowel sounds present 8. Lower extremities: no clubbing, cyanosis, or edema 9. Neurologically Grossly intact, moving all 4 extremities equally  tremoulus 10. MSK: Normal range of motion   LABS:     Recent Labs  Lab 12/12/17 1841  WBC 4.8  NEUTROABS 3.9  HGB 14.7  HCT 41.4  MCV 86.8  PLT 983*   Basic Metabolic Panel: Recent Labs  Lab 12/12/17 1841  NA 134*  K 4.5  CL 97*  CO2 23  GLUCOSE 112*  BUN 13  CREATININE 0.95  CALCIUM 9.6      Recent Labs  Lab 12/12/17 1841  AST 147*  ALT 152*  ALKPHOS 71  BILITOT 1.4*  PROT 8.2*  ALBUMIN 4.4   No results for input(s): LIPASE, AMYLASE in the last 168 hours. No results for input(s): AMMONIA in the last 168 hours.    HbA1C: No results for input(s): HGBA1C in the last 72 hours. CBG: No results for input(s): GLUCAP in the last 168 hours.    Urine analysis:    Component Value Date/Time   COLORURINE YELLOW 12/12/2017 1951   APPEARANCEUR CLEAR 12/12/2017 1951   LABSPEC 1.018 12/12/2017 1951   PHURINE 7.0 12/12/2017 1951   GLUCOSEU NEGATIVE 12/12/2017 1951   HGBUR  SMALL (A) 12/12/2017 1951   HGBUR negative 12/22/2007 0831   BILIRUBINUR NEGATIVE 12/12/2017 1951   KETONESUR 20 (A) 12/12/2017 1951   PROTEINUR 100 (A) 12/12/2017 1951   UROBILINOGEN 1.0 06/02/2015 2032   NITRITE NEGATIVE 12/12/2017 1951   LEUKOCYTESUR NEGATIVE 12/12/2017 1951    Cultures:    Component Value Date/Time   SDES URINE, CLEAN CATCH 04/15/2014 1205   [place] 04/15/2014 1205   CULT  04/15/2014 1205    Multiple bacterial morphotypes present, none predominant. Suggest appropriate recollection if clinically indicated. Performed at [facility] 04/16/2014 FINAL 04/15/2014 1205     Radiological Exams on Admission: Dg Chest 2 View  Result Date: 12/12/2017  CLINICAL DATA:  Cough. EXAM: CHEST - 2 VIEW COMPARISON:  Radiographs of Nov 22, 2017. FINDINGS: The heart size and mediastinal contours are within normal limits. Both lungs are clear. No pneumothorax or pleural effusion is noted. The visualized skeletal structures are unremarkable. IMPRESSION: No active cardiopulmonary disease. Electronically Signed   By: Marijo Conception, M.D.   On: 12/12/2017 18:37   Ct Head Wo Contrast  Result Date: 12/12/2017 CLINICAL DATA:  Acute onset of seizure and fall. Hit back of head on kitchen floor. Initial encounter. EXAM: CT HEAD WITHOUT CONTRAST TECHNIQUE: Contiguous axial images were obtained from the base of the skull through the vertex without intravenous contrast. COMPARISON:  CT of the head performed 10/19/2017 FINDINGS: Brain: No evidence of acute infarction, hemorrhage, hydrocephalus, extra-axial collection or mass lesion / mass effect. Prominence of the sulci suggests mild cortical volume loss. The brainstem and fourth ventricle are within normal limits. The basal ganglia are unremarkable in appearance. The cerebral hemispheres demonstrate grossly normal gray-white differentiation. No mass effect or midline shift is seen. Vascular: No hyperdense vessel or unexpected  calcification. Skull: There is no evidence of fracture; visualized osseous structures are unremarkable in appearance. Sinuses/Orbits: The orbits are within normal limits. The paranasal sinuses and mastoid air cells are well-aerated. Other: No significant soft tissue abnormalities are seen. IMPRESSION: 1. No evidence of traumatic intracranial injury or fracture. 2. Mild cortical volume loss noted. Electronically Signed   By: Garald Balding M.D.   On: 12/12/2017 23:35    Chart has been reviewed    Assessment/Plan  37 y.o. male with medical history significant of polysubstance abuse(alcohol, cocaine, tobacco),PVD, HLD, history of breast CA, renal CA, PTSD, depression with history of multiple suicide attempts, schizophrenia, bipolar disorder, CAD  Admitted for alcohol withdrawal resulting in seizures  Present on Admission: . Alcohol abuse -severe resulting in withdrawal resulting in seizures social work consult . Alcohol withdrawal (HCC) severe admit to stepdown, CIWA protocol, IV thiamine and vitamins  . Cocaine abuse ([place]) - social work consult treat supportively for withdrawal  . Elevated transaminase level -likely secondary to alcohol abuse we will check hepatitis serology, hepatic steatosis on ultrasound on 10 May . Essential hypertension -stable resume home medications if able . HYPERCHOLESTEROLEMIA -patient noncompliant secondary to alcoholism . Severe episode of recurrent major depressive disorder, without psychotic features ([place]) behavioral health consult ordered prolonged QT will need to repeat EKG prior to restarting home medications that could prolong QT . Thrombocytopenia ([place]) secondary to alcohol abuse  . Tobacco use disorder -spoke about importance of quitting tobacco cessation protocol . Melena -hemoglobin stable continue to monitor, Hemoccult stool if evidence of Hemoccult positive stools and significant hemoglobin drop will need GI consult Prolonged QT -- will monitor on tele  avoid QT prolonging medications, rehydrate correct electrolytes   Other plan as per orders.  DVT prophylaxis:  SCD   Code Status:  FULL CODE   Family Communication:   Family not  at  Bedside    Disposition Plan:    likely will need placement for rehabilitation                                              Social Work     consulted  Consults called: none   Admission status:    inpatient      Level of care         SDU       Han Lysne 12/13/2017, 1:15 AM    [facility]  Pager [phone]   after 2 AM please page floor coverage PA If 7AM-7PM, please contact the day team taking care of the patient  [email or web address]  Password [email or web address]

## 2017-12-12 NOTE — ED Notes (Signed)
Dr. Sherry Ruffing made aware of pt's CIWA score

## 2017-12-12 NOTE — ED Notes (Signed)
ED TO INPATIENT HANDOFF REPORT  Name/Age/Gender [name] 37 y.o. male  Code Status Code Status History    Date Active Date Inactive Code Status Order ID Comments User Context   11/23/2017 0304 11/27/2017 1847 Full Code [id number]  Alma Friendly, MD ED   11/17/2017 1600 11/17/2017 2124 Full Code [id number]  Emeline General, PA-C ED   11/03/2017 0000 11/03/2017 1748 Full Code [id number]  Jillyn Ledger, PA-C ED   10/20/2017 1442 10/26/2017 1943 Full Code [id number]  Rory Percy, DO ED   10/20/2017 0133 10/20/2017 1442 Full Code [id number]  Jillyn Ledger, PA-C ED   10/19/2017 2159 10/20/2017 0133 Full Code [id number]  Jillyn Ledger, PA-C ED   07/28/2017 1841 08/03/2017 2102 Full Code [id number]  Ethelene Hal, NP Inpatient   07/26/2017 2001 07/28/2017 1754 Full Code [id number]  Davonna Belling, MD ED   05/10/2016 1823 05/11/2016 1339 Full Code [id number]  Isla Pence, MD ED   05/05/2016 0239 05/09/2016 1730 Full Code [id number]  Delos Haring, PA-C ED   04/22/2016 1543 04/27/2016 1452 Full Code [id number]  Linard Millers, MD Inpatient   04/21/2016 1912 04/22/2016 1455 Full Code [id number]  Jeannett Senior, PA-C ED   12/30/2015 1505 01/05/2016 1824 Full Code [id number]  [name], NP Inpatient   12/29/2015 2344 12/30/2015 1505 Full Code [id number]  Leonard Schwartz, MD ED   11/28/2015 1822 12/03/2015 1935 Full Code [id number]  Delfin Gant, NP Inpatient   11/28/2015 0255 11/28/2015 1822 Full Code [id number]  Charlann Lange, PA-C ED   10/23/2015 0300 10/23/2015 1829 Full Code [id number]  Antonietta Breach, PA-C ED   10/08/2015 1406 10/09/2015 2050 Full Code [id number]  Delfin Gant, NP Inpatient   10/07/2015 2155 10/08/2015 1406 Full Code [id number]  Forde Dandy, MD ED   09/23/2015 2330 09/26/2015 1806 Full Code [id number]  Etta Quill, DO ED   09/23/2015 2233 09/23/2015 2234 Full Code [id number]  Lacretia Leigh, MD ED   07/16/2015 2012 07/17/2015 2018 Full Code [id number]  Clovis Fredrickson, MD Inpatient   07/12/2015 0301 07/16/2015 2012 Full Code [id number]  Javier Glazier, MD Inpatient   04/15/2015 1509 04/16/2015 1956 Full Code [id number]  Delfin Gant, NP Inpatient   04/14/2015 1620 04/15/2015 1509 Full Code [id number]  Merrily Pew, MD ED   04/10/2015 2255 04/13/2015 1833 Full Code [id number]  Merton Border, MD Inpatient   09/10/2014 2232 09/13/2014 1504 Full Code [id number]  Laverle Hobby, PA-C Inpatient   09/10/2014 0322 09/10/2014 2232 Full Code [id number]  Kalman Drape, MD ED   08/03/2014 1721 08/06/2014 1918 Full Code [id number]  Ivor Costa, MD Inpatient   04/15/2014 1725 04/18/2014 1558 Full Code [id number]  Barton Dubois, MD Inpatient   04/05/2014 2340 04/06/2014 0355 Full Code [id number]  Brent General, PA-C ED   01/29/2014 1655 02/06/2014 1708 Full Code [id number]  Benjamine Mola, FNP Inpatient   01/28/2014 2200 01/29/2014 1655 Full Code [id number]  Laverle Hobby PA-C Inpatient   09/22/2013 1435 09/25/2013 2029 Full Code [id number]  Theodis Blaze, MD Inpatient   09/20/2013 0045 09/22/2013 1435 Full Code [id number]  Laverle Hobby, PA-C Inpatient   09/19/2013 1330 09/20/2013 0045 Full Code [id number]  Montine Circle, PA-C ED   08/02/2013 0641 08/02/2013 2345 Full Code [id number]  Martie Lee, PA-C ED   04/27/2013 2049 04/29/2013 1715 Full Code [id number]  Elmarie Shiley, MD Inpatient   03/27/2013 1334 03/28/2013 1655 Full Code [id number]  Carlota Raspberry,  [place] [facility] ED   12/24/2012 0209 12/25/2012 2129 Full Code [id number]  Trish Fountain, MD Inpatient   12/04/2012 1600 12/05/2012 0944 Full Code [id number]  Carmin Muskrat, MD ED   11/30/2012 0832 11/30/2012 2005 Full Code [id number]  Hoy Morn, MD ED   11/22/2012 0350 11/22/2012 2222 Full Code [id number]  Rhunette Croft, MD ED   07/05/2012 0036 07/06/2012 0152 Full Code [id number]  Virgel Manifold, MD ED   06/27/2012 0118 06/28/2012 0545 Full Code [id number]  Hoy Morn, MD ED   03/28/2012 1044 03/29/2012 1855 Full Code [id number]   Janice Norrie, MD ED   03/28/2012 0335 03/28/2012 1044 Full Code [id number]  Linus Mako, [place] ED   11/13/2011 0123 11/16/2011 1948 Full Code [id number]  Maryelizabeth Rowan, RN Inpatient   10/12/2011 1352 10/21/2011 1412 Full Code [id number]  Madolyn Frieze, RN Inpatient   10/10/2011 0206 10/12/2011 1352 Full Code [id number]  Ezequiel Essex, MD ED   07/14/2011 1737 07/16/2011 1801 Full Code [id number]  Rebbeca Paul, RN Inpatient      Home/SNF/Other Home  Chief Complaint seizure; withdrawl  Level of Care/Admitting Diagnosis ED Disposition    ED Disposition Condition [place]: [facility] [100102]  Level of Care: Stepdown [14]  Admit to SDU based on following criteria: Severe physiological/psychological symptoms:  Any diagnosis requiring assessment & intervention at least every 4 hours on an ongoing basis to obtain desired patient outcomes including stability and rehabilitation  Diagnosis: Alcohol withdrawal ([place]) [291.81.ICD-9-CM]  Admitting Physician: Toy Baker [[id number]]  Attending Physician: Toy Baker [[id number]]  Estimated length of stay: 3 - 4 days  Certification:: I certify this patient will need inpatient services for at least 2 midnights  PT Class (Do Not Modify): Inpatient [101]  PT Acc Code (Do Not Modify): Private [1]       Medical History Past Medical History:  Diagnosis Date  . Angina   . Anxiety    panic attack  . Bipolar 1 disorder ([place])   . Breast CA ([place]) dx'd 2009   bil w/ bil masectomy and oral meds  . Cancer [facility])    kidney cancer  . Coronary artery disease   . Depression   . H/O suicide attempt 2015   overdose  . Headache(784.0)   . Hypercholesteremia   . Hypertension   . Liver cirrhosis ([place])   . Pancreatitis   . Peripheral vascular disease [facility]) April 2011   Left Pop  . Schizophrenia ([place])   . Seizures ([place])    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  . Shortness of breath     Allergies Allergies   Allergen Reactions  . Codeine Hives, Itching, Swelling and Other (See Comments)    Does not impair breathing, however  . Penicillins Swelling and Other (See Comments)    Has patient had a PCN reaction causing immediate rash, facial/tongue/throat swelling, SOB or lightheadedness with hypotension: Yes Has patient had a PCN reaction causing severe rash involving mucus membranes or skin necrosis: Yes Has patient had a PCN reaction that required hospitalization Yes-ed visit Has patient had a PCN reaction occurring within the last 10 years: Yes If all of the above answers are "NO", then may proceed with Cephalosporin use.   Marland Kitchen Morphine Itching  . Coconut Flavor Itching and Swelling  . Coconut Oil Other (See Comments)    Cannot take with some of his meds  . Grapefruit Concentrate Other (See Comments)  Cannot take with some of his meds  . Morphine And Related Itching and Swelling  . Oxycodone Itching and Swelling  . Norco [Hydrocodone-Acetaminophen] Itching and Rash    IV Location/Drains/Wounds Patient Lines/Drains/Airways Status   Active Line/Drains/Airways    Name:   Placement date:   Placement time:   Site:   Days:   Peripheral IV 12/12/17 Right;Posterior Wrist   12/12/17    1847    Wrist   less than 1          Labs/Imaging Results for orders placed or performed during the hospital encounter of 12/12/17 (from the past 48 hour(s))  CBC with Differential     Status: Abnormal   Collection Time: 12/12/17  6:41 PM  Result Value Ref Range   WBC 4.8 4.0 - 10.5 K/uL   RBC 4.77 4.22 - 5.81 MIL/uL   Hemoglobin 14.7 13.0 - 17.0 g/dL   HCT 41.4 39.0 - 52.0 %   MCV 86.8 78.0 - 100.0 fL   MCH 30.8 26.0 - 34.0 pg   MCHC 35.5 30.0 - 36.0 g/dL   RDW 15.6 (H) 11.5 - 15.5 %   Platelets 110 (L) 150 - 400 K/uL    Comment: REPEATED TO VERIFY SPECIMEN CHECKED FOR CLOTS PLATELET COUNT CONFIRMED BY SMEAR    Neutrophils Relative % 82 %   Lymphocytes Relative 12 %   Monocytes Relative 6 %    Eosinophils Relative 0 %   Basophils Relative 0 %   Neutro Abs 3.9 1.7 - 7.7 K/uL   Lymphs Abs 0.6 (L) 0.7 - 4.0 K/uL   Monocytes Absolute 0.3 0.1 - 1.0 K/uL   Eosinophils Absolute 0.0 0.0 - 0.7 K/uL   Basophils Absolute 0.0 0.0 - 0.1 K/uL   Smear Review MORPHOLOGY UNREMARKABLE     Comment: Performed at [facility], [place] [address], [place], [place] [address]  Comprehensive metabolic panel     Status: Abnormal   Collection Time: 12/12/17  6:41 PM  Result Value Ref Range   Sodium 134 (L) 135 - 145 mmol/L   Potassium 4.5 3.5 - 5.1 mmol/L   Chloride 97 (L) 101 - 111 mmol/L   CO2 23 22 - 32 mmol/L   Glucose, Bld 112 (H) 65 - 99 mg/dL   BUN 13 6 - 20 mg/dL   Creatinine, Ser 0.95 0.61 - 1.24 mg/dL   Calcium 9.6 8.9 - 10.3 mg/dL   Total Protein 8.2 (H) 6.5 - 8.1 g/dL   Albumin 4.4 3.5 - 5.0 g/dL   AST 147 (H) 15 - 41 U/L   ALT 152 (H) 17 - 63 U/L   Alkaline Phosphatase 71 38 - 126 U/L   Total Bilirubin 1.4 (H) 0.3 - 1.2 mg/dL   GFR calc non Af Amer >60 >60 mL/min   GFR calc Af Amer >60 >60 mL/min    Comment: (NOTE) The eGFR has been calculated using the CKD EPI equation. This calculation has not been validated in all clinical situations. eGFR's persistently <60 mL/min signify possible Chronic Kidney Disease.    Anion gap 14 5 - 15    Comment: Performed at [facility], [place] [address], [place], [place] [address]  Protime-INR     Status: None   Collection Time: 12/12/17  6:41 PM  Result Value Ref Range   Prothrombin Time 12.8 11.4 - 15.2 seconds   INR 0.97     Comment: Performed at [facility], [place] [address] [place],  [place] [address]  Type and screen [place]     Status: None   Collection Time: 12/12/17  6:41 PM  Result Value Ref Range   ABO/RH(D) O POS    Antibody Screen NEG    Sample Expiration      12/15/2017 Performed at [facility], [place] [address], [place], [place]  [address]   Ethanol     Status: None   Collection Time: 12/12/17  6:41 PM  Result Value Ref Range   Alcohol, Ethyl (B) <10 <10 mg/dL    Comment: (NOTE) Lowest detectable limit for serum alcohol is 10 mg/dL. For medical purposes only. Performed at [facility], [place] [address], [place], [place] [address]   Salicylate level     Status: None   Collection Time: 12/12/17  6:41 PM  Result Value Ref Range   Salicylate Lvl <7.6 2.8 - 30.0 mg/dL    Comment: Performed at [facility], [place] [address] [place], [place] [address]  Acetaminophen level     Status: Abnormal   Collection Time: 12/12/17  6:41 PM  Result Value Ref Range   Acetaminophen (Tylenol), Serum <10 (L) 10 - 30 ug/mL    Comment: (NOTE) Therapeutic concentrations vary significantly. A range of 10-30 ug/mL  may be an effective concentration for many patients. However, some  are best treated at concentrations outside of this range. Acetaminophen concentrations >150 ug/mL at 4 hours after ingestion  and >50 ug/mL at 12 hours after ingestion are often associated with  toxic reactions. Performed at [facility], [place] [address], [place], [place] [address]   Urinalysis, Routine w reflex microscopic     Status: Abnormal   Collection Time: 12/12/17  7:51 PM  Result Value Ref Range   Color, Urine YELLOW YELLOW   APPearance CLEAR CLEAR   Specific Gravity, Urine 1.018 1.005 - 1.030   pH 7.0 5.0 - 8.0   Glucose, UA NEGATIVE NEGATIVE mg/dL   Hgb urine dipstick SMALL (A) NEGATIVE   Bilirubin Urine NEGATIVE NEGATIVE   Ketones, ur 20 (A) NEGATIVE mg/dL   Protein, ur 100 (A) NEGATIVE mg/dL   Nitrite NEGATIVE NEGATIVE   Leukocytes, UA NEGATIVE NEGATIVE   RBC / HPF 0-5 0 - 5 RBC/hpf   WBC, UA 0-5 0 - 5 WBC/hpf   Bacteria, UA RARE (A) NONE SEEN   Squamous Epithelial / LPF 0-5 0 - 5   Mucus PRESENT    Hyaline Casts, UA PRESENT     Comment: Performed at [facility], [place] [address] [place], [place] [address]  Rapid urine drug screen (hospital performed)     Status: Abnormal   Collection Time: 12/12/17  7:51 PM  Result Value Ref Range   Opiates NONE DETECTED NONE DETECTED   Cocaine POSITIVE (A) NONE DETECTED   Benzodiazepines NONE DETECTED NONE DETECTED   Amphetamines NONE DETECTED NONE DETECTED   Tetrahydrocannabinol NONE DETECTED NONE DETECTED   Barbiturates NONE DETECTED NONE DETECTED    Comment: (NOTE) DRUG SCREEN FOR MEDICAL PURPOSES ONLY.  IF CONFIRMATION IS NEEDED FOR ANY PURPOSE, NOTIFY LAB WITHIN 5 DAYS. LOWEST DETECTABLE LIMITS FOR URINE DRUG SCREEN Drug Class                     Cutoff (ng/mL) Amphetamine and metabolites    1000 Barbiturate and metabolites    200 Benzodiazepine  417 Tricyclics and metabolites     300 Opiates and metabolites        300 Cocaine and metabolites        300 THC                            50 Performed at [facility], [place] [address], [place], [place] [address]   POC occult blood, ED     Status: Abnormal   Collection Time: 12/12/17  9:57 PM  Result Value Ref Range   Fecal Occult Bld POSITIVE (A) NEGATIVE   Dg Chest 2 View  Result Date: 12/12/2017 CLINICAL DATA:  Cough. EXAM: CHEST - 2 VIEW COMPARISON:  Radiographs of Nov 22, 2017. FINDINGS: The heart size and mediastinal contours are within normal limits. Both lungs are clear. No pneumothorax or pleural effusion is noted. The visualized skeletal structures are unremarkable. IMPRESSION: No active cardiopulmonary disease. Electronically Signed   By: Marijo Conception, M.D.   On: 12/12/2017 18:37    Pending Labs Unresulted Labs (From admission, onward)   Start     Ordered   12/12/17 1841  ABO/Rh  Once,   R     12/12/17 1841   12/12/17 1818  Urine culture  STAT,   STAT     12/12/17 1818   Signed and Held  HIV antibody  Once,   R     Signed and Held   Signed and Held  Hepatitis panel, acute  Tomorrow morning,    R     Signed and Held   Signed and Held  Magnesium  Tomorrow morning,   R    Comments:  Call MD if <1.5    Signed and Held   Signed and Held  Phosphorus  Tomorrow morning,   R     Signed and Held   Signed and Held  TSH  Once,   R    Comments:  Cancel if already done within 1 month and notify MD    Signed and Held   Signed and Held  Comprehensive metabolic panel  Once,   R    Comments:  Cal MD for K<3.5 or >5.0    Signed and Held   Signed and Held  CBC  Once,   R    Comments:  Call for hg <8.0    Signed and Held      Vitals/Pain Today's Vitals   12/12/17 2200 12/12/17 2230 12/12/17 2300 12/12/17 2319  BP: (!) 152/99 (!) 145/104 (!) 147/100 (!) 147/100  Pulse: 91 94 93 93  Resp: 17 13 (!) 21   Temp:      TempSrc:      SpO2: 94% 96% 96%   Weight:      Height:      PainSc:        Isolation Precautions No active isolations  Medications Medications  LORazepam (ATIVAN) injection 0-4 mg (has no administration in time range)    Or  LORazepam (ATIVAN) tablet 0-4 mg (has no administration in time range)  LORazepam (ATIVAN) injection 0-4 mg (has no administration in time range)    Or  LORazepam (ATIVAN) tablet 0-4 mg (has no administration in time range)  thiamine (VITAMIN B-1) tablet 100 mg ( Oral See Alternative 12/12/17 1849)    Or  thiamine (B-1) injection 100 mg (100 mg Intravenous Given 12/12/17 1849)  diphenhydrAMINE (BENADRYL) capsule 25 mg (has no administration in time range)  LORazepam (ATIVAN) injection 2 mg (2 mg Intravenous Given 12/12/17 1843)  sodium chloride 0.9 % bolus 1,000 mL (0 mLs Intravenous Stopped 12/12/17 2130)    Mobility walks

## 2017-12-12 NOTE — ED Notes (Signed)
Bed: BL39 Expected date:  Expected time:  Means of arrival:  Comments: 37 yo m alcohol detox, seizure

## 2017-12-12 NOTE — ED Notes (Signed)
This nurse attempted to call Caryl Pina RN for report on this pt at this time.

## 2017-12-12 NOTE — ED Provider Notes (Addendum)
[facility] DEPT Provider Note   CSN: [id number] Arrival date & time: 12/12/17  1702     History   Chief Complaint Chief Complaint  Patient presents with  . Seizures    HPI [name] is a 37 y.o. male.  The history is provided by the patient and medical records.  Seizures   This is a recurrent problem. The current episode started 3 to 5 hours ago. There was 1 seizure. The most recent episode lasted 30 to 120 seconds. Associated symptoms include cough and vomiting. Pertinent negatives include no confusion, no headaches, no visual disturbance, no neck stiffness, no chest pain, no nausea and no diarrhea. Characteristics include rhythmic jerking and loss of consciousness. The episode was witnessed. The seizures did not continue in the ED. Possible causes include change in alcohol use. There has been no fever.    Past Medical History:  Diagnosis Date  . Angina   . Anxiety    panic attack  . Bipolar 1 disorder ([place])   . Breast CA ([place]) dx'd 2009   bil w/ bil masectomy and oral meds  . Cancer [facility])    kidney cancer  . Coronary artery disease   . Depression   . H/O suicide attempt 2015   overdose  . Headache(784.0)   . Hypercholesteremia   . Hypertension   . Liver cirrhosis ([place])   . Pancreatitis   . Peripheral vascular disease [facility]) April 2011   Left Pop  . Schizophrenia ([place])   . Seizures ([place])    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  . Shortness of breath     Patient Active Problem List   Diagnosis Date Noted  . Emesis 10/20/2017  . Displaced fracture of lateral end of left clavicle, initial encounter for closed fracture 08/05/2017  . Coracoclavicular (ligament) sprain and strain, left, initial encounter 08/05/2017  . Closed dislocation of acromioclavicular joint, initial encounter 08/05/2017  . Insomnia   . Hypertension   . Severe episode of recurrent major depressive disorder, without psychotic features ([place])   . Depression, major,  severe recurrence ([place]) 12/30/2015  . Substance induced mood disorder ([place]) 12/02/2015  . Mood disorder in conditions classified elsewhere   . Malnutrition of moderate degree 09/24/2015  . Alcohol withdrawal ([place]) 09/23/2015  . Tobacco use disorder 07/16/2015  . Elevated transaminase level   . Drug overdose, intentional ([place]) 07/12/2015  . Alcohol use disorder, severe, dependence ([place]) 04/15/2015  . Cocaine abuse with cocaine-induced mood disorder ([place]) 04/11/2015  . Overdose 04/10/2015  . Severe recurrent major depressive disorder with psychotic features ([place])   . Severe major depression with psychotic features ([place]) 09/11/2014  . Alcohol-induced mood disorder ([place]) 09/10/2014  . Suicidal ideation   . Tylenol overdose   . Polysubstance abuse ([place])   . Overdose of acetaminophen 08/03/2014  . Overdose by acetaminophen 08/03/2014  . Cocaine abuse ([place]) 04/16/2014  . Thrombocytopenia ([place]) 04/15/2014  . Urinary tract infection, site not specified 04/15/2014  . Chest pain, unspecified 04/15/2014  . D-dimer, elevated 04/15/2014  . Transaminitis 09/24/2013  . Cocaine dependence ([place]) 09/20/2013  . S/p nephrectomy 04/28/2013  . Seizure ([place]) 03/15/2013  . Syncope 01/02/2013  . Leukocytopenia, unspecified 01/02/2013  . Left kidney mass 12/24/2012  . PTSD (post-traumatic stress disorder) 07/06/2012  . Episode of recurrent major depressive disorder ([place]) 06/29/2012    Class: Acute  . Peripheral vascular disease ([place]) 01/14/2012  . Alcohol abuse 10/13/2011  . SEIZURE DISORDER 10/03/2008  . Elevated LFTs 12/29/2007  .  HYPERCHOLESTEROLEMIA 03/21/2007  . Essential hypertension 03/21/2007    Past Surgical History:  Procedure Laterality Date  . BREAST SURGERY    . BREAST SURGERY     bilateral breast silocone  removal  . CHEST SURGERY    . left kidney removal    . left leg surgery     "popiteal artery clogged"  . MASTECTOMY Bilateral   . NEPHRECTOMY Left   . ORIF CLAVICULAR  FRACTURE Left 08/10/2017   Procedure: OPEN REDUCTION INTERNAL FIXATION (ORIF) LEFT CLAVICLE FRACTURE WITH RECONSTRUCTION OF CORACOCLAVICULAR LIGAMENT;  Surgeon: Leandrew Koyanagi, MD;  Location: [facility];  Service: Orthopedics;  Laterality: Left;  . RECONSTRUCTION OF CORACOCLAVICULAR LIGAMENT Left 08/10/2017   Procedure: RECONSTRUCTION OF CORACOCLAVICULAR LIGAMENT;  Surgeon: Leandrew Koyanagi, MD;  Location: [facility];  Service: Orthopedics;  Laterality: Left;        Home Medications    Prior to Admission medications   Medication Sig Start Date End Date Taking? Authorizing Provider  amLODipine (NORVASC) 5 MG tablet Take 1 tablet (5 mg total) by mouth daily. 11/28/17  Yes Hosie Poisson, MD  folic acid (FOLVITE) 1 MG tablet Take 1 tablet (1 mg total) by mouth daily. 11/27/17  Yes Hosie Poisson, MD  gabapentin (NEURONTIN) 300 MG capsule Take 1 capsule (300 mg total) by mouth 3 (three) times daily. 11/03/17  Yes [name], NP  Melatonin 5 MG TABS Take 1-2 tablets (5-10 mg total) by mouth at bedtime. 11/27/17  Yes Hosie Poisson, MD  Multiple Vitamin (MULTIVITAMIN WITH MINERALS) TABS tablet Take 1 tablet by mouth daily. 11/27/17  Yes Hosie Poisson, MD  thiamine 100 MG tablet Take 1 tablet (100 mg total) by mouth daily. 11/27/17  Yes Hosie Poisson, MD    Family History Family History  Problem Relation Age of Onset  . Stroke Other   . Cancer Other   . Hyperlipidemia Mother   . Hypertension Mother     Social History Social History   Tobacco Use  . Smoking status: Former Research scientist (life sciences)  . Smokeless tobacco: Never Used  . Tobacco comment: smoked on and off 16 years- quit 2016  Substance Use Topics  . Alcohol use: Yes  . Drug use: No    Frequency: 1.0 times per week    Types: "Crack" cocaine, Cocaine     Allergies   Codeine; Penicillins; Morphine; Coconut flavor; Coconut oil; Grapefruit concentrate; Morphine and related; Oxycodone; and Norco [hydrocodone-acetaminophen]   Review of Systems Review of  Systems  Constitutional: Positive for diaphoresis. Negative for chills, fatigue and fever.  HENT: Negative for congestion.   Eyes: Negative for visual disturbance.  Respiratory: Positive for cough. Negative for chest tightness, shortness of breath and wheezing.   Cardiovascular: Negative for chest pain, palpitations and leg swelling.  Gastrointestinal: Positive for blood in stool (dark adn tarry per pt) and vomiting. Negative for abdominal pain, constipation, diarrhea and nausea.  Genitourinary: Negative for flank pain and frequency.  Musculoskeletal: Negative for back pain, neck pain and neck stiffness.  Neurological: Positive for seizures and loss of consciousness. Negative for light-headedness and headaches.  Psychiatric/Behavioral: Negative for confusion.  All other systems reviewed and are negative.    Physical Exam Updated Vital Signs BP (!) 164/146 (BP Location: Left Arm)   Pulse (!) 115   Temp 98.5 F (36.9 C) (Oral)   Resp 16   Ht 5\' 6"  (1.676 m)   Wt 68 kg (150 lb)   SpO2 98%   BMI 24.21 kg/m  Physical Exam  Constitutional: He is oriented to person, place, and time. He appears well-developed and well-nourished. No distress.  HENT:  Head: Normocephalic and atraumatic.  Mouth/Throat: Oropharynx is clear and moist. No oropharyngeal exudate.  Eyes: Pupils are equal, round, and reactive to light. Conjunctivae and EOM are normal.  Neck: Normal range of motion. Neck supple.  Cardiovascular: Regular rhythm. Tachycardia present.  No murmur heard. Pulmonary/Chest: Effort normal and breath sounds normal. No respiratory distress. He has no wheezes. He has no rales. He exhibits no tenderness.  Abdominal: Soft. He exhibits no distension. There is no tenderness. There is no guarding.  Musculoskeletal: He exhibits no edema or tenderness.  Neurological: He is alert and oriented to person, place, and time. He is not disoriented. He displays tremor. No cranial nerve deficit or sensory  deficit. He exhibits normal muscle tone. Coordination normal.  Skin: Skin is warm. He is diaphoretic.  Psychiatric: He has a normal mood and affect.  Nursing note and vitals reviewed.    ED Treatments / Results  Labs (all labs ordered are listed, but only abnormal results are displayed) Labs Reviewed  CBC WITH DIFFERENTIAL/PLATELET - Abnormal; Notable for the following components:      Result Value   RDW 15.6 (*)    Platelets 110 (*)    Lymphs Abs 0.6 (*)    All other components within normal limits  COMPREHENSIVE METABOLIC PANEL - Abnormal; Notable for the following components:   Sodium 134 (*)    Chloride 97 (*)    Glucose, Bld 112 (*)    Total Protein 8.2 (*)    AST 147 (*)    ALT 152 (*)    Total Bilirubin 1.4 (*)    All other components within normal limits  URINALYSIS, ROUTINE W REFLEX MICROSCOPIC - Abnormal; Notable for the following components:   Hgb urine dipstick SMALL (*)    Ketones, ur 20 (*)    Protein, ur 100 (*)    Bacteria, UA RARE (*)    All other components within normal limits  RAPID URINE DRUG SCREEN, HOSP PERFORMED - Abnormal; Notable for the following components:   Cocaine POSITIVE (*)    All other components within normal limits  ACETAMINOPHEN LEVEL - Abnormal; Notable for the following components:   Acetaminophen (Tylenol), Serum <10 (*)    All other components within normal limits  URINE CULTURE  PROTIME-INR  ETHANOL  SALICYLATE LEVEL  TYPE AND SCREEN  ABO/RH    EKG EKG Interpretation  Date/Time:  Monday Dec 12 2017 17:57:51 EDT Ventricular Rate:  120 PR Interval:    QRS Duration: 90 QT Interval:  376 QTC Calculation: 532 R Axis:   66 Text Interpretation:  sinus Tachycardia Prolonged QT interval when compared to prior, longer QTC.  No STEMI Confirmed by Antony Blackbird [phone]) on 12/12/2017 6:33:35 PM   Radiology Dg Chest 2 View  Result Date: 12/12/2017 CLINICAL DATA:  Cough. EXAM: CHEST - 2 VIEW COMPARISON:  Radiographs of Nov 22, 2017. FINDINGS: The heart size and mediastinal contours are within normal limits. Both lungs are clear. No pneumothorax or pleural effusion is noted. The visualized skeletal structures are unremarkable. IMPRESSION: No active cardiopulmonary disease. Electronically Signed   By: Marijo Conception, M.D.   On: 12/12/2017 18:37    Procedures Procedures (including critical care time)  CRITICAL CARE Performed by: Gwenyth Allegra Tegeler Total critical care time: 35 minutes Critical care time was exclusive of separately billable procedures and treating other patients. Critical  care was necessary to treat or prevent imminent or life-threatening deterioration. Critical care was time spent personally by me on the following activities: development of treatment plan with patient and/or surrogate as well as nursing, discussions with consultants, evaluation of patient's response to treatment, examination of patient, obtaining history from patient or surrogate, ordering and performing treatments and interventions, ordering and review of laboratory studies, ordering and review of radiographic studies, pulse oximetry and re-evaluation of patient's condition.   Medications Ordered in ED Medications  LORazepam (ATIVAN) injection 0-4 mg (has no administration in time range)    Or  LORazepam (ATIVAN) tablet 0-4 mg (has no administration in time range)  LORazepam (ATIVAN) injection 0-4 mg (has no administration in time range)    Or  LORazepam (ATIVAN) tablet 0-4 mg (has no administration in time range)  thiamine (VITAMIN B-1) tablet 100 mg ( Oral See Alternative 12/12/17 1849)    Or  thiamine (B-1) injection 100 mg (100 mg Intravenous Given 12/12/17 1849)  LORazepam (ATIVAN) injection 2 mg (2 mg Intravenous Given 12/12/17 1843)  sodium chloride 0.9 % bolus 1,000 mL (1,000 mLs Intravenous New Bag/Given 12/12/17 1848)     Initial Impression / Assessment and Plan / ED Course  I have reviewed the triage vital signs and  the nursing notes.  Pertinent labs & imaging results that were available during my care of the patient were reviewed by me and considered in my medical decision making (see chart for details).     [name] is a 37 y.o. male with past medical history significant for bipolar disorder, anxiety, depression, alcohol abuse, hypertension, schizophrenia, cirrhosis, prior breast cancer and kidney cancer, and CAD who presents with seizure concerning for alcohol withdrawal seizure.  Patient reports that he had alcohol withdrawal seizure in the past.  He reports that he typically drinks around 3 bottles of wine a day and decided today to "quit cold [place]".  He says that he started getting very shaky, diaphoretic, had some nausea and vomiting, and then had some hallucinations.  He reports that he was seeing dark shadows which she has seen with alcohol withdrawal hallucinations in the past.  He says that while in the kitchen after calling for help he had a seizure lasting several minutes.  According to bystanders family report to patient, he was shaking all over and frothing at the mouth.  He reports this is consistent with prior seizures.  He says that he has not been taking any of his other medications but has been drinking alcohol.  He reports a dry cough for the last few days.  He also reports that he has had some dark tarry stools for the last week consistent with prior GI bleeds which she has had before.  He denies other complaints on arrival.  On exam, patient is tachycardic in the 110-120 range.  Patient is slightly diaphoretic.  Patient is tremulous all over.  Patient is saying he is having occasional dark short hallucinations.  Patient denies any chest pain, shortness of breath, abdominal pain or back pain.  He does report some mild nausea.  Exam is otherwise unremarkable.  Clinically I am concerned about alcohol withdrawal seizures on the patient.  CIWA score will be calculated however patient will be  given Ativan initially.  Patient also given fluids.  With the patient's cough, he will have a chest x-ray will have screening laboratory testing with his reported GI bleeding.  Anticipate admission for alcohol withdrawal seizure.  6:53 PM Initial CIWA  was a 21.   9:29 PM Diagnostic results are seen above.  UDS showed cocaine but urine showed no evidence of infection.  CBC shows no leukocytosis or anemia.  Alcohol is undetectable which fits with the patient story of "cold [place]".  Chest x-ray reassuring.  Fecal occult was positive.  They will likely trend his hemoglobins which were not low.  Hospitalist and will be called for admission for alcohol withdrawal seizure and alcohol withdrawals.   Final Clinical Impressions(s) / ED Diagnoses   Final diagnoses:  Seizure ([place])  Alcohol withdrawal seizure without complication [facility]    ED Discharge Orders    None      Clinical Impression: 1. Seizure ([place])   2. Alcohol withdrawal seizure without complication ([place])     Disposition: Admit  This note was prepared with assistance of Dragon voice recognition software. Occasional wrong-word or sound-a-like substitutions may have occurred due to the inherent limitations of voice recognition software.      Tegeler, Gwenyth Allegra, MD 12/13/17 0001    Tegeler, Gwenyth Allegra, MD 12/13/17 8403    Tegeler, Gwenyth Allegra, MD 01/03/18 [phone]

## 2017-12-12 NOTE — ED Notes (Signed)
Pt states last drink was this morning.

## 2017-12-12 NOTE — ED Triage Notes (Signed)
Pt bib EMS from home.  Pt is wanting to detox from ETOH.  Pt had plans in place with a facility but had a ETOH related seizure today.  On EMS arrival, pt was cold and clammy but was a/o x 4.    EMS VS: BP: 150/100, HR: 120, 97% RA CBG: 116

## 2017-12-13 DIAGNOSIS — Z818 Family history of other mental and behavioral disorders: Secondary | ICD-10-CM

## 2017-12-13 DIAGNOSIS — Z87891 Personal history of nicotine dependence: Secondary | ICD-10-CM

## 2017-12-13 DIAGNOSIS — F419 Anxiety disorder, unspecified: Secondary | ICD-10-CM

## 2017-12-13 LAB — CBC
HEMATOCRIT: 36.3 % — AB (ref 39.0–52.0)
HEMOGLOBIN: 12.5 g/dL — AB (ref 13.0–17.0)
MCH: 30.1 pg (ref 26.0–34.0)
MCHC: 34.4 g/dL (ref 30.0–36.0)
MCV: 87.5 fL (ref 78.0–100.0)
Platelets: 82 10*3/uL — ABNORMAL LOW (ref 150–400)
RBC: 4.15 MIL/uL — AB (ref 4.22–5.81)
RDW: 15.4 % (ref 11.5–15.5)
WBC: 3.7 10*3/uL — ABNORMAL LOW (ref 4.0–10.5)

## 2017-12-13 LAB — COMPREHENSIVE METABOLIC PANEL
ALK PHOS: 53 U/L (ref 38–126)
ALT: 109 U/L — ABNORMAL HIGH (ref 17–63)
ANION GAP: 12 (ref 5–15)
AST: 99 U/L — ABNORMAL HIGH (ref 15–41)
Albumin: 3.4 g/dL — ABNORMAL LOW (ref 3.5–5.0)
BUN: 13 mg/dL (ref 6–20)
CO2: 24 mmol/L (ref 22–32)
Calcium: 8.6 mg/dL — ABNORMAL LOW (ref 8.9–10.3)
Chloride: 101 mmol/L (ref 101–111)
Creatinine, Ser: 0.84 mg/dL (ref 0.61–1.24)
GFR calc non Af Amer: >60 mL/min (ref >60)
GLUCOSE: 89 mg/dL (ref 65–99)
Potassium: 3.6 mmol/L (ref 3.5–5.1)
SODIUM: 137 mmol/L (ref 135–145)
Total Bilirubin: 1.2 mg/dL (ref 0.3–1.2)
Total Protein: 6.4 g/dL — ABNORMAL LOW (ref 6.5–8.1)

## 2017-12-13 LAB — PHOSPHORUS: PHOSPHORUS: 2.5 mg/dL (ref 2.5–4.6)

## 2017-12-13 LAB — HIV ANTIBODY (ROUTINE TESTING W REFLEX): HIV SCREEN 4TH GENERATION: NONREACTIVE

## 2017-12-13 LAB — MAGNESIUM: MAGNESIUM: 1.8 mg/dL (ref 1.7–2.4)

## 2017-12-13 LAB — MRSA PCR SCREENING: MRSA by PCR: NEGATIVE

## 2017-12-13 LAB — ABO/RH: ABO/RH(D): O POS

## 2017-12-13 LAB — TSH: TSH: 1.28 u[IU]/mL (ref 0.350–4.500)

## 2017-12-13 IMAGING — CT CT HEAD W/O CM
1 series · 16 of 30 positions shown, 20 images · non-contrast
Comparison: CT of the head and MRI of the brain performed
12/25/2014

CLINICAL DATA: Acute onset of medication overdose. Initial
encounter.

EXAM:
CT HEAD WITHOUT CONTRAST
TECHNIQUE: Contiguous axial images were obtained from the base of the skull
through the vertex without intravenous contrast.

[Series 2: headseq 4.8 h45s · axial · 0.43mm/px · z∈[-154,-26]mm · 16 of 30 slices shown, 20 images]
[im 2/30  brain]
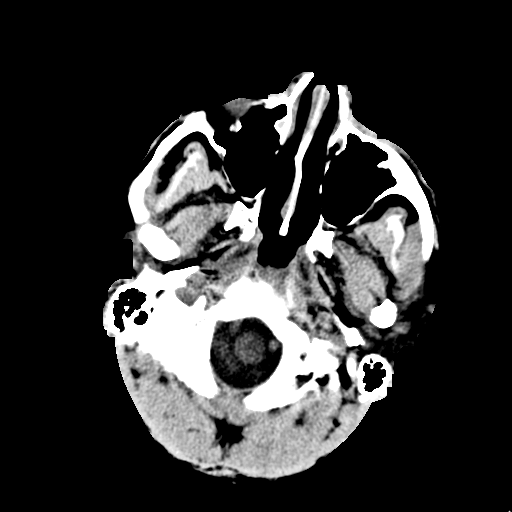
[im 2/30  bone]
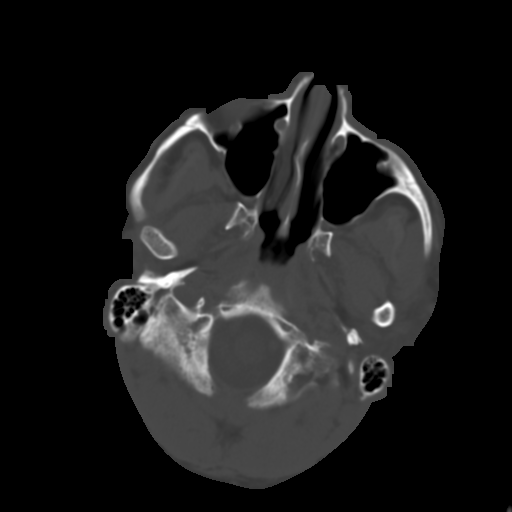
[im 4/30  brain]
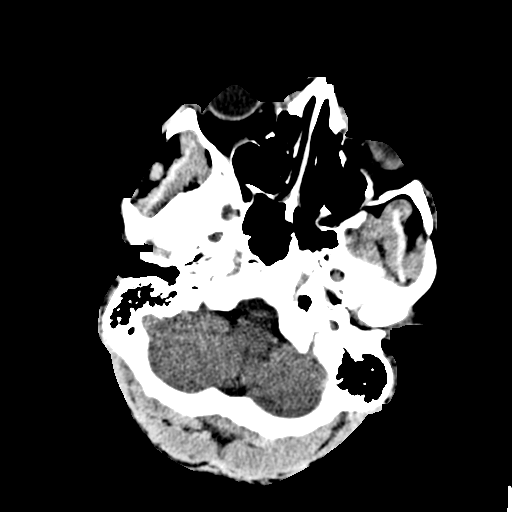
[im 6/30  brain]
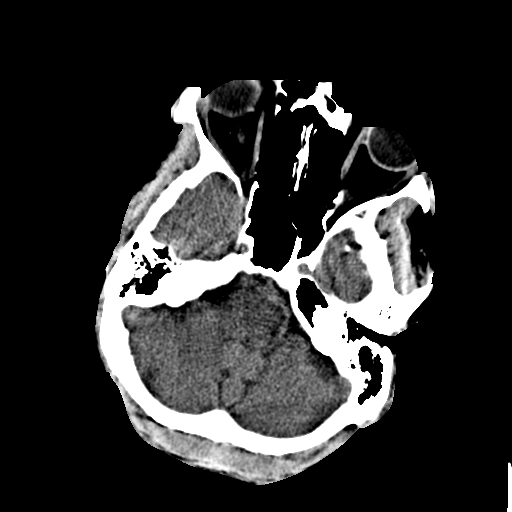
[im 8/30  brain]
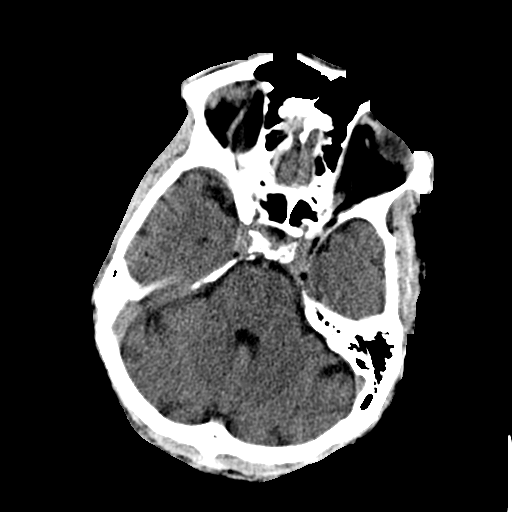
[im 9/30  brain]
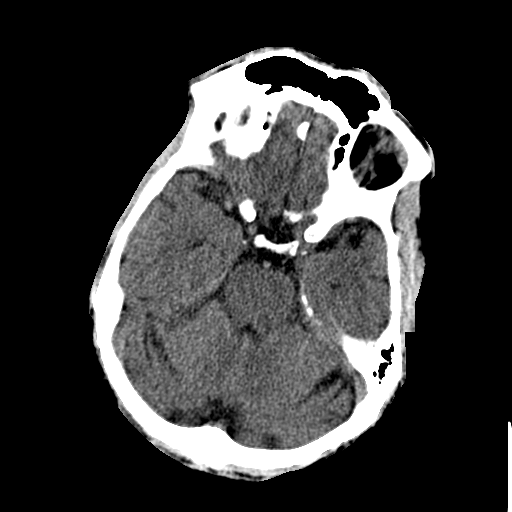
[im 9/30  bone]
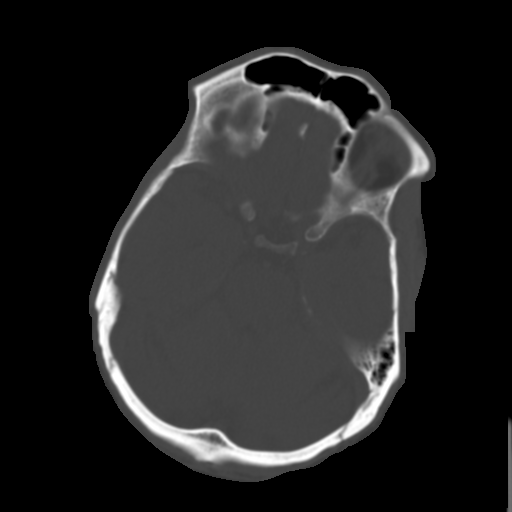
[im 11/30  brain]
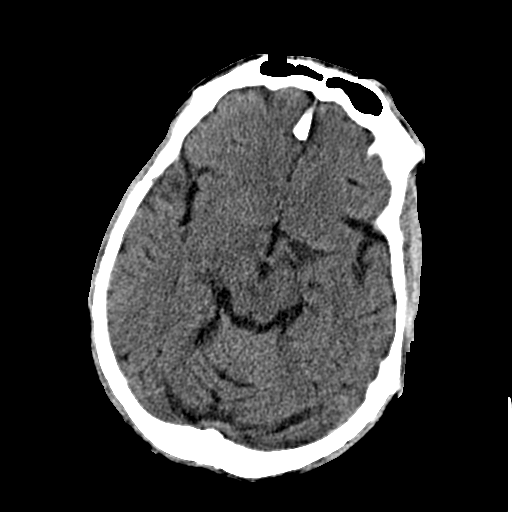
[im 13/30  brain]
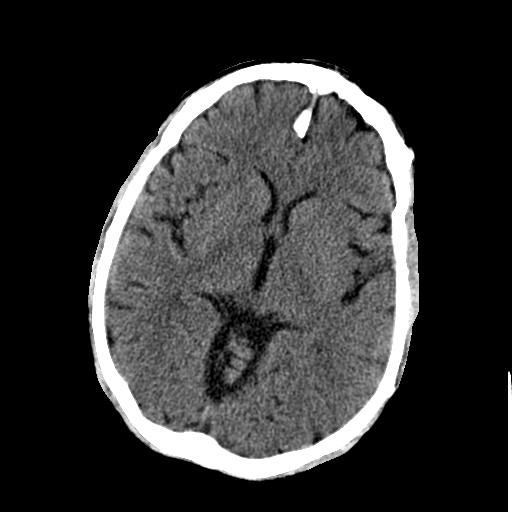
[im 15/30  brain]
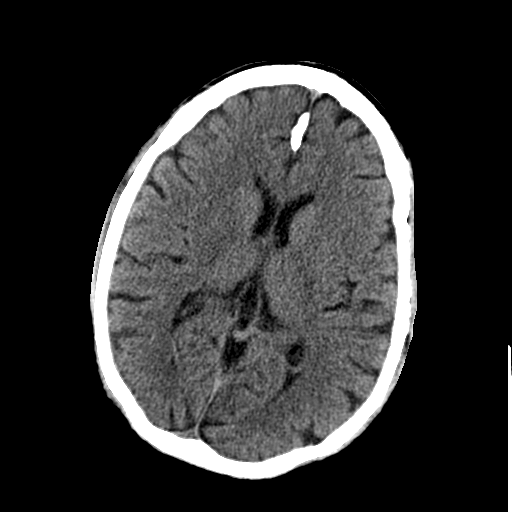
[im 16/30  brain]
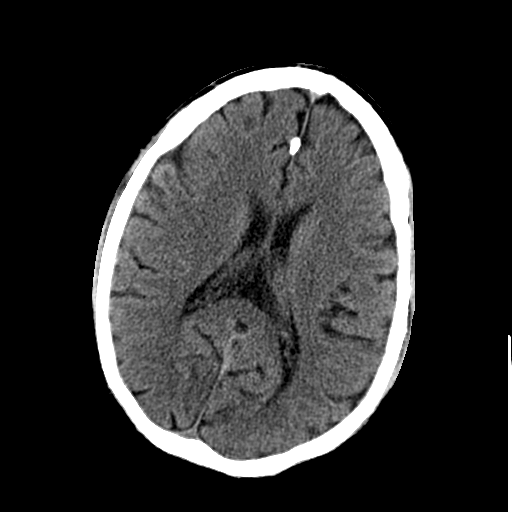
[im 16/30  bone]
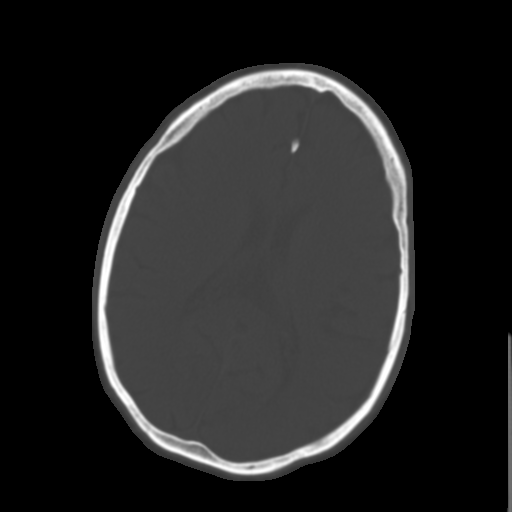
[im 18/30  brain]
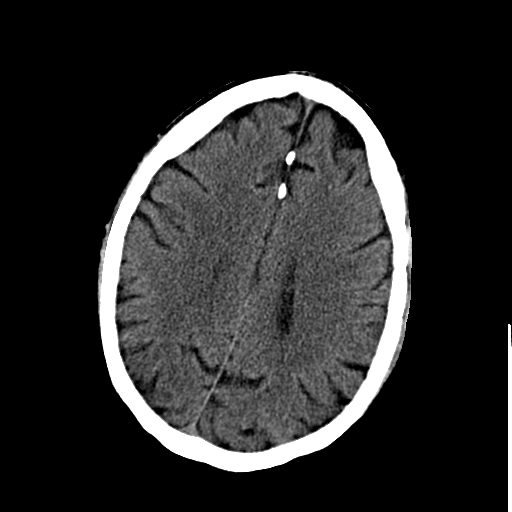
[im 20/30  brain]
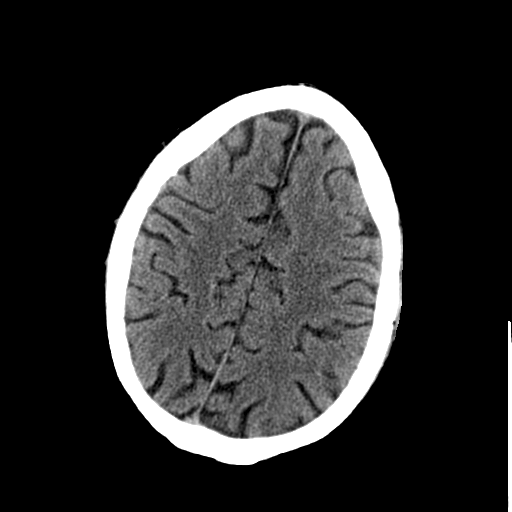
[im 22/30  brain]
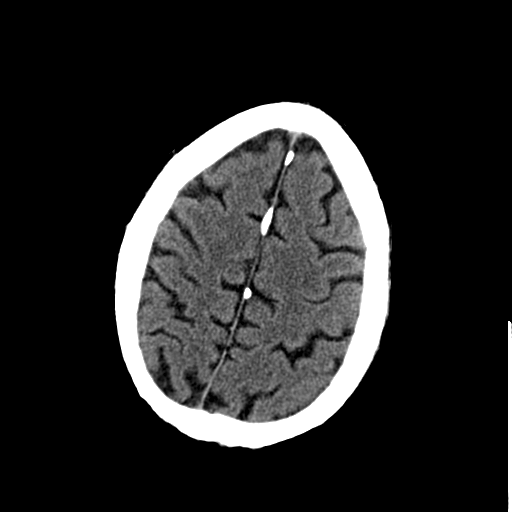
[im 23/30  brain]
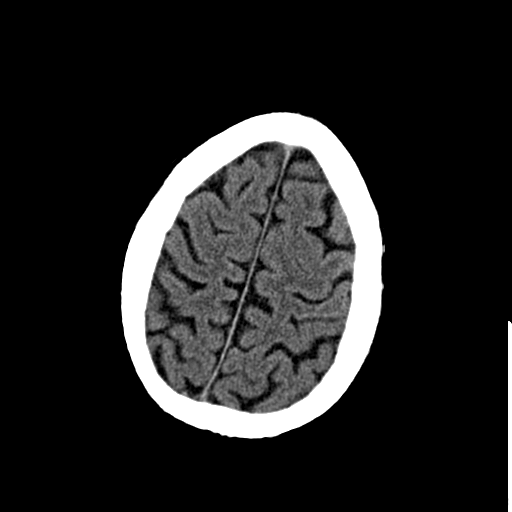
[im 23/30  bone]
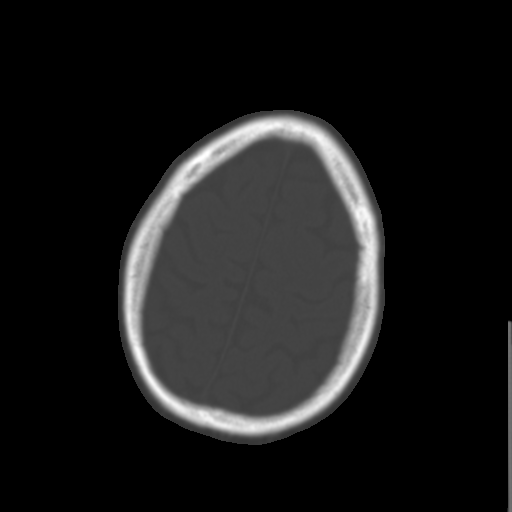
[im 25/30  brain]
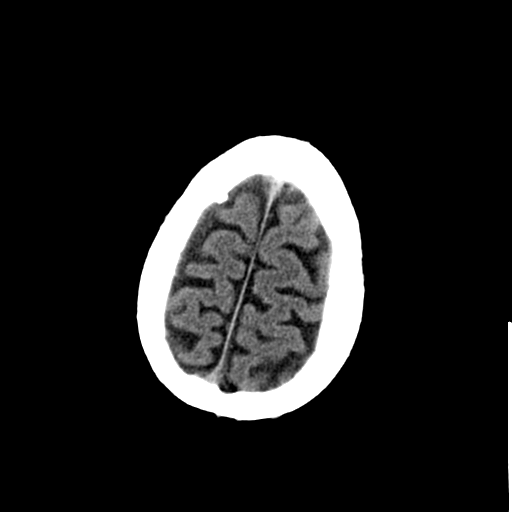
[im 27/30  brain]
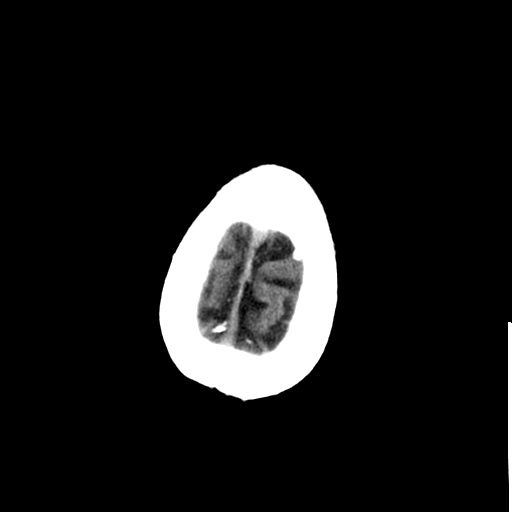
[im 29/30  brain]
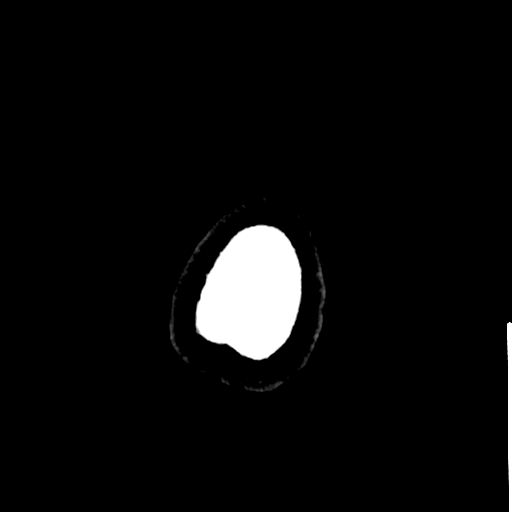

[16 of 30 positions shown; findings below may reference images not displayed]

FINDINGS: There is no evidence of acute infarction, mass lesion, or intra- or
extra-axial hemorrhage on CT.

The posterior fossa, including the cerebellum, brainstem and fourth
ventricle, is within normal limits. The third and lateral
ventricles, and basal ganglia are unremarkable in appearance. The
cerebral hemispheres are symmetric in appearance, with normal
gray-white differentiation. No mass effect or midline shift is seen.

There is no evidence of fracture; visualized osseous structures are
unremarkable in appearance. The orbits are within normal limits. The
paranasal sinuses and mastoid air cells are well-aerated. No
significant soft tissue abnormalities are seen.
IMPRESSION: Unremarkable noncontrast CT of the head.

## 2017-12-13 MED ORDER — SODIUM CHLORIDE 0.9 % IV SOLN
INTRAVENOUS | Status: DC
  Administered 2017-12-13 – 2017-12-15 (×3): via INTRAVENOUS

## 2017-12-13 MED ORDER — ONDANSETRON HCL 4 MG PO TABS
4.0000 mg | ORAL_TABLET | Freq: Four times a day (QID) | ORAL | Status: DC | PRN
Start: 2017-12-13 — End: 2017-12-15

## 2017-12-13 MED ORDER — BUSPIRONE HCL 10 MG PO TABS
10.0000 mg | ORAL_TABLET | Freq: Three times a day (TID) | ORAL | Status: DC
  Administered 2017-12-13 – 2017-12-15 (×7): 10 mg via ORAL
  Filled 2017-12-13 (×7): qty 1

## 2017-12-13 MED ORDER — SODIUM CHLORIDE 0.9 % IV SOLN
INTRAVENOUS | Status: AC
  Administered 2017-12-13: 01:00:00 via INTRAVENOUS

## 2017-12-13 MED ORDER — LORAZEPAM 2 MG/ML IJ SOLN
2.0000 mg | INTRAMUSCULAR | Status: DC | PRN
  Administered 2017-12-13 (×5): 2 mg via INTRAVENOUS
  Filled 2017-12-13 (×6): qty 1

## 2017-12-13 MED ORDER — SODIUM CHLORIDE 0.9 % IV SOLN: INTRAVENOUS | Status: DC

## 2017-12-13 MED ORDER — AMLODIPINE BESYLATE 5 MG PO TABS
5.0000 mg | ORAL_TABLET | Freq: Every day | ORAL | Status: DC
  Administered 2017-12-13 – 2017-12-15 (×3): 5 mg via ORAL
  Filled 2017-12-13 (×3): qty 1

## 2017-12-13 MED ORDER — LAMOTRIGINE 25 MG PO TABS
25.0000 mg | ORAL_TABLET | Freq: Every day | ORAL | Status: DC
  Administered 2017-12-13 – 2017-12-15 (×3): 25 mg via ORAL
  Filled 2017-12-13 (×3): qty 1

## 2017-12-13 MED ORDER — QUETIAPINE FUMARATE 100 MG PO TABS
100.0000 mg | ORAL_TABLET | Freq: Every day | ORAL | Status: DC
  Administered 2017-12-13 – 2017-12-14 (×2): 100 mg via ORAL
  Filled 2017-12-13 (×2): qty 1

## 2017-12-13 MED ORDER — ZOLPIDEM TARTRATE 5 MG PO TABS
5.0000 mg | ORAL_TABLET | Freq: Once | ORAL | Status: AC
  Administered 2017-12-13: 5 mg via ORAL
  Filled 2017-12-13: qty 1

## 2017-12-13 MED ORDER — ONDANSETRON HCL 4 MG/2ML IJ SOLN: 4.0000 mg | Freq: Four times a day (QID) | INTRAMUSCULAR | Status: DC | PRN

## 2017-12-13 MED ORDER — FOLIC ACID 5 MG/ML IJ SOLN
1.0000 mg | Freq: Every day | INTRAMUSCULAR | Status: DC
  Administered 2017-12-13: 1 mg via INTRAVENOUS
  Filled 2017-12-13 (×2): qty 0.2

## 2017-12-13 MED ORDER — THIAMINE HCL 100 MG/ML IJ SOLN: 100.0000 mg | Freq: Every day | INTRAMUSCULAR | Status: DC

## 2017-12-13 NOTE — Progress Notes (Addendum)
Called by Warren Lacy about pt's BP and no current medication being used.  Per previous note from Hospitalist, they wanted to resume pt's home Norvasc when pt able to take.  Paged Dr. Posey Pronto to relay message. Will continue to monitor

## 2017-12-13 NOTE — Evaluation (Signed)
Physical Therapy Evaluation Patient Details Name: Simpson Paulos MRN: [id number] DOB: 06/22/1981 Today's Date: 12/13/2017   History of Present Illness  37 yo male admitted with ETOH abuse/withdrawal, seizures. Hx of polysubstance abuse, PVD, PTSD, ORIF L collar bone 07/2017, schizophrenia, bipolar d/o.     Clinical Impression  On eval, pt was Min guard assist for ambulation distance of ~250 feet. Mildly unsteady but no overt LOB. Pt tolerated distance well. He denied dizziness/lightheadedness. Do not anticipate any f/u PT needs. Will not follow during hospital stay. Recommend daily ambulation in hallway with nursing supervision until pt returns to baseline. 1x eval. Will sign off.     Follow Up Recommendations No PT follow up    Equipment Recommendations  None recommended by PT    Recommendations for Other Services       Precautions / Restrictions Precautions Precautions: Fall Precaution Comments: sz precaution Restrictions Weight Bearing Restrictions: No      Mobility  Bed Mobility Overal bed mobility: Independent                Transfers Overall transfer level: Modified independent                  Ambulation/Gait Ambulation/Gait assistance: Min guard Ambulation Distance (Feet): 250 Feet Assistive device: None Gait Pattern/deviations: Step-through pattern;Drifts right/left     General Gait Details: mildly unsteady at times. Pt tolerated distance well  Stairs            Wheelchair Mobility    Modified Rankin (Stroke Patients Only)       Balance Overall balance assessment: Mild deficits observed, not formally tested                                           Pertinent Vitals/Pain Pain Assessment: No/denies pain    Home Living Family/patient expects to be discharged to:: Unsure Living Arrangements: Non-relatives/Friends(pt stated he lives with roommates)                    Prior Function Level of Independence:  Independent               Hand Dominance        Extremity/Trunk Assessment   Upper Extremity Assessment Upper Extremity Assessment: Overall WFL for tasks assessed    Lower Extremity Assessment Lower Extremity Assessment: Overall WFL for tasks assessed    Cervical / Trunk Assessment Cervical / Trunk Assessment: Normal  Communication   Communication: No difficulties  Cognition Arousal/Alertness: Awake/alert Behavior During Therapy: WFL for tasks assessed/performed Overall Cognitive Status: Within Functional Limits for tasks assessed                                        General Comments      Exercises     Assessment/Plan    PT Assessment Patent does not need any further PT services  PT Problem List         PT Treatment Interventions      PT Goals (Current goals can be found in the Care Plan section)  Acute Rehab PT Goals Patient Stated Goal: [facility] PT Goal Formulation: All assessment and education complete, DC therapy    Frequency     Barriers to discharge  Co-evaluation               AM-PAC PT "6 Clicks" Daily Activity  Outcome Measure Difficulty turning over in bed (including adjusting bedclothes, sheets and blankets)?: None Difficulty moving from lying on back to sitting on the side of the bed? : None Difficulty sitting down on and standing up from a chair with arms (e.g., wheelchair, bedside commode, etc,.)?: None Help needed moving to and from a bed to chair (including a wheelchair)?: None Help needed walking in hospital room?: A Little Help needed climbing 3-5 steps with a railing? : A Little 6 Click Score: 22    End of Session   Activity Tolerance: Patient tolerated treatment well Patient left: in bed;with bed alarm set;with call bell/phone within reach        Time: 1045-1100 PT Time Calculation (min) (ACUTE ONLY): 15 min   Charges:   PT Evaluation $PT Eval Moderate Complexity: 1 Mod     PT  G Codes:          Weston Anna, MPT Pager: [phone]

## 2017-12-13 NOTE — Progress Notes (Signed)
[facility] Progress Note  Patient: [name] MWN:[id number]   PCP: Patient, No Pcp Per DOB: March 23, 1981   DOA: 12/12/2017   DOS: 12/13/2017   Date of Service: the patient was seen and examined on 12/13/2017  Subjective: Denies any acute complaint, no further seizures while in the hospital.  No nausea no vomiting.  Drowsy.  Reportedly become anxious overnight.  Brief hospital course: Pt. with PMH of polysubstance abuse, PVD, HLD, PTSD, depression, schizophrenia with bipolar disorder; admitted on 12/12/2017, presented with complaint of seizure, was found to have alcohol induced seizure. Currently further plan is monitor for withdrawal.  Assessment and Plan: 1.  Alcohol induced seizure with alcohol withdrawal. Continue to monitor in the stepdown unit. Continue CIWA protocol. Continue IV thiamine and vitamins.  2.  Depression with bipolar disorder as well as schizophrenia. Started on Seroquel per recommendation from psychiatry. For anxiety patient is also started on BuSpar. Patient was on Depakote for mood stabilizer in the past but now it has been stopped since April due to pancytopenia. Discussed with psychiatry, patient will be started on Lamictal for mood stabilization effect. Regimen for bipolar disorder for maintenance, regimens not containing carbamazepine, phenytoin, phenobarbital, primidone, rifampin, lopinavir/ritonavir, or valproic acid: Initial: Weeks 1 and 2: 25 mg once daily; Weeks 3 and 4: 50 mg once daily; Week 5: 100 mg once daily; Week 6 and maintenance: 200 mg once daily  3.  Hypertension. Cocaine abuse. Social worker consulted. Resuming Norvasc. Monitor.  4.  Elevated LFT. From alcohol abuse. Monitor. Hepatitis panel ordered.  5.  Reported melena. Hemoglobin is stable.  No further melena here in the hospital. Would not pursue any further work-up at present.  6.  Thrombocytopenia. From alcohol abuse. Monitor.  Diet: Cardiac diet DVT Prophylaxis:  mechanical compression device  Advance goals of care discussion: full code  Family Communication: no family was present at bedside, at the time of interview.  Disposition:  Discharge to be determined.  Consultants: psychiatry  Procedures: none  Antibiotics: Anti-infectives (From admission, onward)   None       Objective: Physical Exam: Vitals:   12/13/17 1200 12/13/17 1300 12/13/17 1400 12/13/17 1600  BP: (!) 154/102 (!) 137/97 (!) 150/99 (!) 138/98  Pulse: 80 84 96 89  Resp: 16 18 16    Temp: 98.5 F (36.9 C)   98.5 F (36.9 C)  TempSrc: Oral   Oral  SpO2: 98% 98% 99%   Weight:      Height:        Intake/Output Summary (Last 24 hours) at 12/13/2017 1801 Last data filed at 12/13/2017 1620 Gross per 24 hour  Intake 2722.34 ml  Output 825 ml  Net 1897.34 ml   Filed Weights   12/12/17 1725  Weight: 68 kg (150 lb)   General: Alert, Awake and Oriented to Time, Place and Person. Appear in mild distress, affect flat Eyes: PERRL, Conjunctiva normal ENT: Oral Mucosa clear moist. Neck: difficult to assess JVD, no Abnormal Mass Or lumps Cardiovascular: S1 and S2 Present, no Murmur, Peripheral Pulses Present Respiratory: normal respiratory effort, Bilateral Air entry equal and Decreased, no use of accessory muscle, Clear to Auscultation, no Crackles, no wheezes Abdomen: Bowel Sound present, Soft and no tenderness, no hernia Skin: no redness, no Rash, no induration Extremities: no Pedal edema, no calf tenderness Neurologic: Grossly no focal neuro deficit. Bilaterally Equal motor strength  Data Reviewed: CBC: Recent Labs  Lab 12/12/17 1841 12/13/17 0403  WBC 4.8 3.7*  NEUTROABS 3.9  --  HGB 14.7 12.5*  HCT 41.4 36.3*  MCV 86.8 87.5  PLT 110* 82*   Basic Metabolic Panel: Recent Labs  Lab 12/12/17 1841 12/13/17 0403  NA 134* 137  K 4.5 3.6  CL 97* 101  CO2 23 24  GLUCOSE 112* 89  BUN 13 13  CREATININE 0.95 0.84  CALCIUM 9.6 8.6*  MG  --  1.8  PHOS  --   2.5    Liver Function Tests: Recent Labs  Lab 12/12/17 1841 12/13/17 0403  AST 147* 99*  ALT 152* 109*  ALKPHOS 71 53  BILITOT 1.4* 1.2  PROT 8.2* 6.4*  ALBUMIN 4.4 3.4*   No results for input(s): LIPASE, AMYLASE in the last 168 hours. No results for input(s): AMMONIA in the last 168 hours. Coagulation Profile: Recent Labs  Lab 12/12/17 1841  INR 0.97   Cardiac Enzymes: No results for input(s): CKTOTAL, CKMB, CKMBINDEX, TROPONINI in the last 168 hours. BNP (last 3 results) No results for input(s): PROBNP in the last 8760 hours. CBG: No results for input(s): GLUCAP in the last 168 hours. Studies: Dg Chest 2 View  Result Date: 12/12/2017 CLINICAL DATA:  Cough. EXAM: CHEST - 2 VIEW COMPARISON:  Radiographs of Nov 22, 2017. FINDINGS: The heart size and mediastinal contours are within normal limits. Both lungs are clear. No pneumothorax or pleural effusion is noted. The visualized skeletal structures are unremarkable. IMPRESSION: No active cardiopulmonary disease. Electronically Signed   By: Marijo Conception, M.D.   On: 12/12/2017 18:37   Ct Head Wo Contrast  Result Date: 12/12/2017 CLINICAL DATA:  Acute onset of seizure and fall. Hit back of head on kitchen floor. Initial encounter. EXAM: CT HEAD WITHOUT CONTRAST TECHNIQUE: Contiguous axial images were obtained from the base of the skull through the vertex without intravenous contrast. COMPARISON:  CT of the head performed 10/19/2017 FINDINGS: Brain: No evidence of acute infarction, hemorrhage, hydrocephalus, extra-axial collection or mass lesion / mass effect. Prominence of the sulci suggests mild cortical volume loss. The brainstem and fourth ventricle are within normal limits. The basal ganglia are unremarkable in appearance. The cerebral hemispheres demonstrate grossly normal gray-white differentiation. No mass effect or midline shift is seen. Vascular: No hyperdense vessel or unexpected calcification. Skull: There is no evidence of  fracture; visualized osseous structures are unremarkable in appearance. Sinuses/Orbits: The orbits are within normal limits. The paranasal sinuses and mastoid air cells are well-aerated. Other: No significant soft tissue abnormalities are seen. IMPRESSION: 1. No evidence of traumatic intracranial injury or fracture. 2. Mild cortical volume loss noted. Electronically Signed   By: Garald Balding M.D.   On: 12/12/2017 23:35    Scheduled Meds: . amLODipine  5 mg Oral Daily  . busPIRone  10 mg Oral TID  . folic acid  1 mg Intravenous Daily  . lamoTRIgine  25 mg Oral Daily  . QUEtiapine  100 mg Oral QHS  . thiamine  100 mg Oral Daily   Or  . thiamine  100 mg Intravenous Daily   Continuous Infusions: . sodium chloride 75 mL/hr at 12/13/17 1458   PRN Meds: diphenhydrAMINE, LORazepam, ondansetron **OR** ondansetron (ZOFRAN) IV  Time spent: 35 minutes  Author: Berle Mull, MD [facility] Pager: [phone] 12/13/2017 6:01 PM  If 7PM-7AM, please contact night-coverage at [email or web address], password [facility]

## 2017-12-13 NOTE — Consult Note (Addendum)
[facility] Consult   Reason for Consult:  Alcohol detox and severe depression  Referring Physician:  Dr. Posey Pronto  [name] MRN:  [id number] Principal Diagnosis: Alcohol use disorder, severe, dependence ([place]) Diagnosis:   Patient Active Problem List   Diagnosis Date Noted  . Melena [K92.1] 12/12/2017  . Emesis [R11.10] 10/20/2017  . Displaced fracture of lateral end of left clavicle, initial encounter for closed fracture [S42.032A] 08/05/2017  . Coracoclavicular (ligament) sprain and strain, left, initial encounter [S43.82XA] 08/05/2017  . Closed dislocation of acromioclavicular joint, initial encounter [D74.128N] 08/05/2017  . Insomnia [G47.00]   . Hypertension [I10]   . Severe episode of recurrent major depressive disorder, without psychotic features ([place]) [F33.2]   . Depression, major, severe recurrence ([place]) [F33.2] 12/30/2015  . Substance induced mood disorder ([place]) [F19.94] 12/02/2015  . Mood disorder in conditions classified elsewhere [F06.30]   . Malnutrition of moderate degree [E44.0] 09/24/2015  . Alcohol withdrawal ([place]) [F10.239] 09/23/2015  . Tobacco use disorder [F17.200] 07/16/2015  . Elevated transaminase level [R74.0]   . Drug overdose, intentional ([place]) [T50.902A] 07/12/2015  . Alcohol use disorder, severe, dependence ([place]) [F10.20] 04/15/2015  . Cocaine abuse with cocaine-induced mood disorder ([place]) [F14.14] 04/11/2015  . Overdose [T50.901A] 04/10/2015  . Severe recurrent major depressive disorder with psychotic features ([place]) [F33.3]   . Severe major depression with psychotic features ([place]) [F32.3] 09/11/2014  . Alcohol-induced mood disorder ([place]) [F10.94] 09/10/2014  . Suicidal ideation [R45.851]   . Tylenol overdose [T39.1X1A]   . Polysubstance abuse ([place]) [F19.10]   . Overdose of acetaminophen [T39.1X1A] 08/03/2014  . Overdose by acetaminophen [T39.1X1A] 08/03/2014  . Cocaine abuse ([place]) [F14.10] 04/16/2014  .  Thrombocytopenia ([place]) [D69.6] 04/15/2014  . Urinary tract infection, site not specified [N39.0] 04/15/2014  . Chest pain, unspecified [R07.9] 04/15/2014  . D-dimer, elevated [R79.89] 04/15/2014  . Transaminitis [R74.0] 09/24/2013  . Cocaine dependence ([place]) [F14.20] 09/20/2013  . 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 Seizure ([place]) [R56.9] 03/15/2013  . Syncope [R55] 01/02/2013  . Leukocytopenia, unspecified [D72.819] 01/02/2013  . Left kidney mass [N28.89] 12/24/2012  . PTSD (post-traumatic stress disorder) [F43.10] 07/06/2012  . Episode of recurrent major depressive disorder ([place]) [F33.9] 06/29/2012    Class: Acute  . Peripheral vascular disease ([place]) [I73.9] 01/14/2012  . Alcohol abuse [F10.10] 10/13/2011  . SEIZURE DISORDER [R56.9] 10/03/2008  . Elevated LFTs [R94.5] 12/29/2007  . HYPERCHOLESTEROLEMIA [E78.00] 03/21/2007  . Essential hypertension [I10] 03/21/2007    Total Time spent with patient: 1 hour  Subjective:   [name] is a 37 y.o. male patient admitted with alcohol withdrawal seizure.  HPI:   Per chart review, patient was admitted with alcohol withdrawal seizure after presenting to the hospital for detox. BAL was negative and UDS was positive for cocaine on admission.  AST of 99 and ALT of 109. He drinks up to 3 bottles of wine daily. He had his last drink the morning prior to admission and became tremulous and diaphoretic with hallucinations. He reports head pain secondary to hitting his head on the floor. He endorses melena for several weeks.   Of note, patient was last seen on 5/12 by the psychiatry service for overdose on Listerine and Trazodone (#15) in th setting of financial stressors. He was in the hospital several days. He reported last taking his medications 3 months ago. He denied depressive symptoms and was future oriented on reassessment. He reported a plan to start a new job at [employer]. He was psychiatrically cleared.  On interview, [name]. [name] reports  wanting long term treatment for substance abuse.  He reports drinking 2 gallons of wine nightly.  He has been intermittently using cocaine.  He last received treatment for alcohol use 4 years ago.  He reports his longest period of sobriety was 1 year.  Current stressors include losing his job and living in a hotel.  He reports that he has not taken his medications in over 3 months.  He is followed by [facility].  He believes that he was doing better with his medications.  He reports fair sleep and appetite.  He denies SI, HI or AVH.  Past Psychiatric History: MDD, PTSD, schizophrenia, BPAD, anxiety, alcohol abuse and cocaine abuse.   Risk to Self: Is patient at risk for suicide?: No Risk to Others:  None. Denies HI.  Prior Inpatient Therapy:  He has a history of multiple hospitalizations and was last hospitalized in 07/2017 for worsening depression with SI.  Prior Outpatient Therapy:  Prior medications include Depakote 500 mg BID and  Buspar 10 mg BID.   Past Medical History:  Past Medical History:  Diagnosis Date  . Angina   . Anxiety    panic attack  . Bipolar 1 disorder ([place])   . Breast CA ([place]) dx'd 2009   bil w/ bil masectomy and oral meds  . Cancer [facility])    kidney cancer  . Coronary artery disease   . Depression   . H/O suicide attempt 2015   overdose  . Headache(784.0)   . Hypercholesteremia   . Hypertension   . Liver cirrhosis ([place])   . Pancreatitis   . Peripheral vascular disease [facility]) April 2011   Left Pop  . Schizophrenia ([place])   . Seizures ([place])    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  . Shortness of breath     Past Surgical History:  Procedure Laterality Date  . BREAST SURGERY    . BREAST SURGERY     bilateral breast silocone  removal  . CHEST SURGERY    . left kidney removal    . left leg surgery     "popiteal artery clogged"  . MASTECTOMY Bilateral   . NEPHRECTOMY Left   . ORIF CLAVICULAR FRACTURE Left 08/10/2017   Procedure: OPEN REDUCTION INTERNAL FIXATION  (ORIF) LEFT CLAVICLE FRACTURE WITH RECONSTRUCTION OF CORACOCLAVICULAR LIGAMENT;  Surgeon: Leandrew Koyanagi, MD;  Location: [facility];  Service: Orthopedics;  Laterality: Left;  . RECONSTRUCTION OF CORACOCLAVICULAR LIGAMENT Left 08/10/2017   Procedure: RECONSTRUCTION OF CORACOCLAVICULAR LIGAMENT;  Surgeon: Leandrew Koyanagi, MD;  Location: [facility];  Service: Orthopedics;  Laterality: Left;   Family History:  Family History  Problem Relation Age of Onset  . Stroke Other   . Cancer Other   . Hyperlipidemia Mother   . Hypertension Mother    Family Psychiatric  History: Father-schizophrenia and PTSD.  Social History:  Social History   Substance and Sexual Activity  Alcohol Use Yes     Social History   Substance and Sexual Activity  Drug Use No  . Frequency: 1.0 times per week  . Types: "Crack" cocaine, Cocaine    Social History   Socioeconomic History  . Marital status: Single    Spouse name: Not on file  . Number of children: Not on file  . Years of education: Not on file  . Highest education level: Not on file  Occupational History  . Not on file  Social Needs  . Financial resource strain: Not on  file  . Food insecurity:    Worry: Not on file    Inability: Not on file  . Transportation needs:    Medical: Not on file    Non-medical: Not on file  Tobacco Use  . Smoking status: Former Research scientist (life sciences)  . Smokeless tobacco: Never Used  . Tobacco comment: smoked on and off 16 years- quit 2016  Substance and Sexual Activity  . Alcohol use: Yes  . Drug use: No    Frequency: 1.0 times per week    Types: "Crack" cocaine, Cocaine  . Sexual activity: Yes    Birth control/protection: Condom    Comment: anal  Lifestyle  . Physical activity:    Days per week: Not on file    Minutes per session: Not on file  . Stress: Not on file  Relationships  . Social connections:    Talks on phone: Not on file    Gets together: Not on file    Attends religious service: Not on file    Active member of club  or organization: Not on file    Attends meetings of clubs or organizations: Not on file    Relationship status: Not on file  Other Topics Concern  . Not on file  Social History Narrative   ** Merged History Encounter **       ** Merged History Encounter **       Additional Social History: He lives in a hotel. He reports heavy alcohol use and intermittent cocaine use.     Allergies:   Allergies  Allergen Reactions  . Codeine Hives, Itching, Swelling and Other (See Comments)    Does not impair breathing, however  . Penicillins Swelling and Other (See Comments)    Has patient had a PCN reaction causing immediate rash, facial/tongue/throat swelling, SOB or lightheadedness with hypotension: Yes Has patient had a PCN reaction causing severe rash involving mucus membranes or skin necrosis: Yes Has patient had a PCN reaction that required hospitalization Yes-ed visit Has patient had a PCN reaction occurring within the last 10 years: Yes If all of the above answers are "NO", then may proceed with Cephalosporin use.   Mar[place] Kitchen Morphine Itching  . Coconut Flavor Itching and Swelling  . Coconut Oil Other (See Comments)    Cannot take with some of his meds  . Grapefruit Concentrate Other (See Comments)    Cannot take with some of his meds  . Morphine And Related Itching and Swelling  . Oxycodone Itching and Swelling  . Norco [Hydrocodone-Acetaminophen] Itching and Rash    Labs:  Results for orders placed or performed during the hospital encounter of 12/12/17 (from the past 48 hour(s))  CBC with Differential     Status: Abnormal   Collection Time: 12/12/17  6:41 PM  Result Value Ref Range   WBC 4.8 4.0 - 10.5 K/uL   RBC 4.77 4.22 - 5.81 MIL/uL   Hemoglobin 14.7 13.0 - 17.0 g/dL   HCT 41.4 39.0 - 52.0 %   MCV 86.8 78.0 - 100.0 fL   MCH 30.8 26.0 - 34.0 pg   MCHC 35.5 30.0 - 36.0 g/dL   RDW 15.6 (H) 11.5 - 15.5 %   Platelets 110 (L) 150 - 400 K/uL    Comment: REPEATED TO VERIFY SPECIMEN  CHECKED FOR CLOTS PLATELET COUNT CONFIRMED BY SMEAR    Neutrophils Relative % 82 %   Lymphocytes Relative 12 %   Monocytes Relative 6 %   Eosinophils Relative 0 %  Basophils Relative 0 %   Neutro Abs 3.9 1.7 - 7.7 K/uL   Lymphs Abs 0.6 (L) 0.7 - 4.0 K/uL   Monocytes Absolute 0.3 0.1 - 1.0 K/uL   Eosinophils Absolute 0.0 0.0 - 0.7 K/uL   Basophils Absolute 0.0 0.0 - 0.1 K/uL   Smear Review MORPHOLOGY UNREMARKABLE     Comment: Performed at [facility], [place] [address] [place], [place] [address]  Comprehensive metabolic panel     Status: Abnormal   Collection Time: 12/12/17  6:41 PM  Result Value Ref Range   Sodium 134 (L) 135 - 145 mmol/L   Potassium 4.5 3.5 - 5.1 mmol/L   Chloride 97 (L) 101 - 111 mmol/L   CO2 23 22 - 32 mmol/L   Glucose, Bld 112 (H) 65 - 99 mg/dL   BUN 13 6 - 20 mg/dL   Creatinine, Ser 0.95 0.61 - 1.24 mg/dL   Calcium 9.6 8.9 - 10.3 mg/dL   Total Protein 8.2 (H) 6.5 - 8.1 g/dL   Albumin 4.4 3.5 - 5.0 g/dL   AST 147 (H) 15 - 41 U/L   ALT 152 (H) 17 - 63 U/L   Alkaline Phosphatase 71 38 - 126 U/L   Total Bilirubin 1.4 (H) 0.3 - 1.2 mg/dL   GFR calc non Af Amer >60 >60 mL/min   GFR calc Af Amer >60 >60 mL/min    Comment: (NOTE) The eGFR has been calculated using the CKD EPI equation. This calculation has not been validated in all clinical situations. eGFR's persistently <60 mL/min signify possible Chronic Kidney Disease.    Anion gap 14 5 - 15    Comment: Performed at [facility], [place] [address] [place] [address], [place], [place] [address]  Protime-INR     Status: None   Collection Time: 12/12/17  6:41 PM  Result Value Ref Range   Prothrombin Time 12.8 11.4 - 15.2 seconds   INR 0.97     Comment: Performed at [facility], [place] [address], [place], [place] [address]  Type and screen [place]     Status: None   Collection Time: 12/12/17  6:41 PM  Result Value Ref Range   ABO/RH(D) O POS     Antibody Screen NEG    Sample Expiration      12/15/2017 Performed at [facility], [place] [address] [place], [place] [address]   Ethanol     Status: None   Collection Time: 12/12/17  6:41 PM  Result Value Ref Range   Alcohol, Ethyl (B) <10 <10 mg/dL    Comment: (NOTE) Lowest detectable limit for serum alcohol is 10 mg/dL. For medical purposes only. Performed at [facility], [place] [address] [place], [place] [address]   Salicylate level     Status: None   Collection Time: 12/12/17  6:41 PM  Result Value Ref Range   Salicylate Lvl <5.6 2.8 - 30.0 mg/dL    Comment: Performed at [facility], [place] [address], [place], [place] [address]  Acetaminophen level     Status: Abnormal   Collection Time: 12/12/17  6:41 PM  Result Value Ref Range   Acetaminophen (Tylenol), Serum <10 (L) 10 - 30 ug/mL    Comment: (NOTE) Therapeutic concentrations vary significantly. A range of 10-30 ug/mL  may be an effective concentration for many patients. However, some  are best treated at concentrations outside of this range. Acetaminophen concentrations >150 ug/mL at 4 hours  after ingestion  and >50 ug/mL at 12 hours after ingestion are often associated with  toxic reactions. Performed at [facility], [place] [address], [place], [place] [address]   ABO/Rh     Status: None   Collection Time: 12/12/17  6:41 PM  Result Value Ref Range   ABO/RH(D)      O POS Performed at [facility], [place] [address], [place], [place] [address]   Urinalysis, Routine w reflex microscopic     Status: Abnormal   Collection Time: 12/12/17  7:51 PM  Result Value Ref Range   Color, Urine YELLOW YELLOW   APPearance CLEAR CLEAR   Specific Gravity, Urine 1.018 1.005 - 1.030   pH 7.0 5.0 - 8.0   Glucose, UA NEGATIVE NEGATIVE mg/dL   Hgb urine dipstick SMALL (A) NEGATIVE   Bilirubin Urine NEGATIVE NEGATIVE   Ketones, ur 20 (A)  NEGATIVE mg/dL   Protein, ur 100 (A) NEGATIVE mg/dL   Nitrite NEGATIVE NEGATIVE   Leukocytes, UA NEGATIVE NEGATIVE   RBC / HPF 0-5 0 - 5 RBC/hpf   WBC, UA 0-5 0 - 5 WBC/hpf   Bacteria, UA RARE (A) NONE SEEN   Squamous Epithelial / LPF 0-5 0 - 5   Mucus PRESENT    Hyaline Casts, UA PRESENT     Comment: Performed at [facility], [place] [address], [place], [place] [address]  Rapid urine drug screen (hospital performed)     Status: Abnormal   Collection Time: 12/12/17  7:51 PM  Result Value Ref Range   Opiates NONE DETECTED NONE DETECTED   Cocaine POSITIVE (A) NONE DETECTED   Benzodiazepines NONE DETECTED NONE DETECTED   Amphetamines NONE DETECTED NONE DETECTED   Tetrahydrocannabinol NONE DETECTED NONE DETECTED   Barbiturates NONE DETECTED NONE DETECTED    Comment: (NOTE) DRUG SCREEN FOR MEDICAL PURPOSES ONLY.  IF CONFIRMATION IS NEEDED FOR ANY PURPOSE, NOTIFY LAB WITHIN 5 DAYS. LOWEST DETECTABLE LIMITS FOR URINE DRUG SCREEN Drug Class                     Cutoff (ng/mL) Amphetamine and metabolites    1000 Barbiturate and metabolites    200 Benzodiazepine                 256 Tricyclics and metabolites     300 Opiates and metabolites        300 Cocaine and metabolites        300 THC                            50 Performed at [facility], [place] [address], [place], [place] [address]   POC occult blood, ED     Status: Abnormal   Collection Time: 12/12/17  9:57 PM  Result Value Ref Range   Fecal Occult Bld POSITIVE (A) NEGATIVE  MRSA PCR Screening     Status: None   Collection Time: 12/12/17 10:00 PM  Result Value Ref Range   MRSA by PCR NEGATIVE NEGATIVE    Comment:        The GeneXpert MRSA Assay (FDA approved for NASAL specimens only), is one component of a comprehensive MRSA colonization surveillance program. It is not intended to diagnose MRSA infection nor to guide or monitor treatment for MRSA infections. Performed at [facility], [place] [address], [place],  [address]   Magnesium  Status: None   Collection Time: 12/13/17  4:03 AM  Result Value Ref Range   Magnesium 1.8 1.7 - 2.4 mg/dL    Comment: Performed at [facility], [place] [address] [place], [place] [address]  Phosphorus     Status: None   Collection Time: 12/13/17  4:03 AM  Result Value Ref Range   Phosphorus 2.5 2.5 - 4.6 mg/dL    Comment: Performed at [facility], [place] [address] [place], [place] [address]  TSH     Status: None   Collection Time: 12/13/17  4:03 AM  Result Value Ref Range   TSH 1.280 0.350 - 4.500 uIU/mL    Comment: Performed by a 3rd Generation assay with a functional sensitivity of <=0.01 uIU/mL. Performed at [facility], [place] [address] [place], [place] [address]   Comprehensive metabolic panel     Status: Abnormal   Collection Time: 12/13/17  4:03 AM  Result Value Ref Range   Sodium 137 135 - 145 mmol/L   Potassium 3.6 3.5 - 5.1 mmol/L    Comment: DELTA CHECK NOTED   Chloride 101 101 - 111 mmol/L   CO2 24 22 - 32 mmol/L   Glucose, Bld 89 65 - 99 mg/dL   BUN 13 6 - 20 mg/dL   Creatinine, Ser 0.84 0.61 - 1.24 mg/dL   Calcium 8.6 (L) 8.9 - 10.3 mg/dL   Total Protein 6.4 (L) 6.5 - 8.1 g/dL   Albumin 3.4 (L) 3.5 - 5.0 g/dL   AST 99 (H) 15 - 41 U/L   ALT 109 (H) 17 - 63 U/L   Alkaline Phosphatase 53 38 - 126 U/L   Total Bilirubin 1.2 0.3 - 1.2 mg/dL   GFR calc non Af Amer >60 >60 mL/min   GFR calc Af Amer >60 >60 mL/min    Comment: (NOTE) The eGFR has been calculated using the CKD EPI equation. This calculation has not been validated in all clinical situations. eGFR's persistently <60 mL/min signify possible Chronic Kidney Disease.    Anion gap 12 5 - 15    Comment: Performed at [facility], [place] [address] [name], [place] [address]  CBC     Status: Abnormal   Collection Time: 12/13/17  4:03 AM  Result Value  Ref Range   WBC 3.7 (L) 4.0 - 10.5 K/uL   RBC 4.15 (L) 4.22 - 5.81 MIL/uL   Hemoglobin 12.5 (L) 13.0 - 17.0 g/dL   HCT 36.3 (L) 39.0 - 52.0 %   MCV 87.5 78.0 - 100.0 fL   MCH 30.1 26.0 - 34.0 pg   MCHC 34.4 30.0 - 36.0 g/dL   RDW 15.4 11.5 - 15.5 %   Platelets 82 (L) 150 - 400 K/uL    Comment: CONSISTENT WITH PREVIOUS RESULT Performed at [place] [address], [place], [place] [address]     Current Facility-Administered Medications  Medication Dose Route Frequency Provider Last Rate Last Dose  . diphenhydrAMINE (BENADRYL) capsule 25 mg  25 mg Oral Q6H PRN Toy Baker, MD   25 mg at 12/13/17 0043  . folic acid injection 1 mg  1 mg Intravenous Daily Doutova, Anastassia, MD   1 mg at 12/13/17 0929  . LORazepam (ATIVAN) injection 2-3 mg  2-3 mg Intravenous Q1H PRN Toy Baker, MD   2 mg at 12/13/17 0559  . ondansetron (ZOFRAN) tablet 4 mg  4 mg Oral Q6H PRN Toy Baker, MD  Or  . ondansetron (ZOFRAN) injection 4 mg  4 mg Intravenous Q6H PRN Doutova, Anastassia, MD      . thiamine (VITAMIN B-1) tablet 100 mg  100 mg Oral Daily Doutova, Anastassia, MD   100 mg at 12/13/17 8280   Or  . thiamine (B-1) injection 100 mg  100 mg Intravenous Daily Toy Baker, MD   100 mg at 12/12/17 1849    Musculoskeletal: Strength & Muscle Tone: within normal limits Gait & Station: UTA since patient was lying in bed. Patient leans: N/A  Psychiatric Specialty Exam: Physical Exam  Nursing note and vitals reviewed. Constitutional: He is oriented to person, place, and time. He appears well-developed and well-nourished.  HENT:  Head: Normocephalic and atraumatic.  Neck: Normal range of motion.  Respiratory: Effort normal.  Musculoskeletal: Normal range of motion.  Neurological: He is alert and oriented to person, place, and time.  Skin: No rash noted.  Psychiatric: His speech is normal and behavior is normal. Judgment and thought content  normal. Cognition and memory are normal. He exhibits a depressed mood.    Review of Systems  Constitutional: Negative for chills and fever.  Cardiovascular: Negative for chest pain.  Gastrointestinal: Negative for abdominal pain, constipation, diarrhea, nausea and vomiting.  Psychiatric/Behavioral: Positive for depression and substance abuse. Negative for hallucinations and suicidal ideas. The patient does not have insomnia.   All other systems reviewed and are negative.   Blood pressure (!) 158/112, pulse 86, temperature 98.5 F (36.9 C), temperature source Oral, resp. rate 14, height '5\' 6"'  (1.676 m), weight 68 kg (150 lb), SpO2 95 %.Body mass index is 24.21 kg/m.  General Appearance: Fairly Groomed, young, African American male, wearing a hospital gown and lying in bed. NAD.    Eye Contact:  Good  Speech:  Clear and Coherent and Normal Rate  Volume:  Normal  Mood:  Depressed  Affect:  Congruent  Thought Process:  Goal Directed, Linear and Descriptions of Associations: Intact  Orientation:  Full (Time, Place, and Person)  Thought Content:  Logical  Suicidal Thoughts:  No  Homicidal Thoughts:  No  Memory:  Immediate;   Good Recent;   Good Remote;   Good  Judgement:  Fair  Insight:  Fair  Psychomotor Activity:  Normal  Concentration:  Concentration: Good and Attention Span: Good  Recall:  Good  Fund of Knowledge:  Good  Language:  Good  Akathisia:  No  Handed:  Right  AIMS (if indicated):   N/A  Assets:  Communication Skills Desire for Improvement Housing Social Support  ADL's:  Intact  Cognition:  WNL  Sleep:   Fair   Assessment: [name] is a 37 y.o. male who was admitted with alcohol withdrawal seizure. He reports ongoing problematic alcohol use with intermittent cocaine use. He desires treatment for alcohol use. His mood is likely affected by heavy alcohol use. He will benefit from substance abuse resources and restarting his home medications. He denies SI, HI or  AVH and does not warrant inpatient psychiatric hospitalization at this time.   Treatment Plan Summary: -Agree with primary team to titrate Lamictal for mood stabilization. Patient previously taking Depakote but has thrombocytopenia.  -Restart Seroquel 100 mg qhs for mood stabilization and Buspar 10 mg BID for anxiety.  -Please have SW provide patient with resources for substance abuse treatment. -Patient should follow up with [facility] for further medication management.  -Psychiatry will sign off on patient at this time. Please consult psychiatry again as needed.  Disposition: No evidence of imminent risk to self or others at present.   Patient does not meet criteria for psychiatric inpatient admission.  Faythe Dingwall, DO 12/13/2017 11:37 AM

## 2017-12-13 NOTE — Clinical Social Work Note (Signed)
Clinical Social Work Assessment  Patient Details  Name: [name] MRN: [id number] Date of Birth: 1981-03-28  Date of referral:  12/13/17               Reason for consult:  Substance Use/ETOH Abuse                Permission sought to share information with:  Family Supports, Case Manager Permission granted to share information::  Yes, Verbal Permission Granted  Name::        Agency::  [facility] substance abuse residential treatment   Relationship::     Contact Information:     Housing/Transportation Living arrangements for the past 2 months:  Homeless Shelter, [place] of Information:  Patient Patient Interpreter Needed:  None Criminal Activity/Legal Involvement Pertinent to Current Situation/Hospitalization:  No - Comment as needed Significant Relationships:  Friend Lives with:  Friends Do you feel safe going back to the place where you live?  Yes Need for family participation in patient care:  No (Coment)  Care giving concerns:   Patient has medical history for [name] is a 37 y.o. male with medical history significant of polysubstance abuse(alcohol, cocaine, tobacco),PVD, HLD, history of breast CA, renal CA, PTSD, depression with history of multiple suicide attempts, schizophrenia, bipolar disorder, CAD Patient agreeable to residential [place]. Patient understands he will go home (gather belongings) and then transfer if space is available at time of discharge.    Social Worker assessment / plan:  CSW met with patient at bedside, explain role and reason visit- to assist with substance abuse resources.  Patient alert and oriented x4 and agreeable to talk with CSW. Patient reports having issues with drinking alcohol. Patient reports he has been "drinking for awhile and using tobacco and  illicit drugs(crack cocaine). "  He reports having several stressors including "getting kicked out of my parents home, not having a job and living in a boarding house with  friends."He reports he has not been able find permanent housing due to lack of funds.   Patient reports he currently goes to [facility] for medication management and therapy. Patient cannot recall the name of the psychiatrist that prescribes his medications. Patient reports he takes several medication but does not feel the regime in working. He has a follow up appointment next week that he plans to reschedule if he goes to residential SA rehab.   Patient reports he went to inpatient rehab twice in 2000 and once in 2005. He was sober for a little over 90 days. Patient is agreeable to SA treatment at this time.   CSW provided emotional support. CSW will assist with arranging appointment for treatment.   Plan: Home/ Residential SA Rehab.   Employment status:  Unemployed Forensic scientist:  Self Pay (Medicaid Pending) PT Recommendations:   No PT follow up Information / Referral to community resources:   Residential SA treatment   Patient/Family's Response to care:  The patient agreeable and responding well to care. No family involvement this admission. Patient reports he does not a good relationship with his parents at this time.   Patient/Family's Understanding of and Emotional Response to Diagnosis, Current Treatment, and Prognosis: Patient express feeling "motivated to change" patient reports the seizures are coming more frequently after he has drank alcohol or used drugs. Patient understands the drugs and alcohol are deteriorate   his health and is agreeable to seek treatment at this time.    Emotional Assessment Appearance:  Appears stated age  Attitude/Demeanor/Rapport:    Affect (typically observed):  Calm, Accepting, Depressed Orientation:  Oriented to Self, Oriented to Place, Oriented to  Time, Oriented to Situation Alcohol / Substance use:  Alcohol Use, Illicit Drugs, Tobacco Use Psych involvement (Current and /or in the community):  No (Comment)  Discharge Needs  Concerns to be  addressed:  Discharge Planning Concerns, Substance Abuse Concerns Readmission within the last 30 days:  No Current discharge risk:  Substance Abuse Barriers to Discharge:  Continued Medical Work up   [employer], LCSW 12/13/2017, 1:09 PM

## 2017-12-14 DIAGNOSIS — R569 Unspecified convulsions: Secondary | ICD-10-CM

## 2017-12-14 DIAGNOSIS — F10230 Alcohol dependence with withdrawal, uncomplicated: Secondary | ICD-10-CM

## 2017-12-14 LAB — CBC WITH DIFFERENTIAL/PLATELET
BASOS ABS: 0 10*3/uL (ref 0.0–0.1)
BASOS PCT: 0 %
EOS ABS: 0.1 10*3/uL (ref 0.0–0.7)
Eosinophils Relative: 3 %
HCT: 37.3 % — ABNORMAL LOW (ref 39.0–52.0)
HEMOGLOBIN: 12.6 g/dL — AB (ref 13.0–17.0)
Lymphocytes Relative: 44 %
Lymphs Abs: 1.4 10*3/uL (ref 0.7–4.0)
MCH: 29.6 pg (ref 26.0–34.0)
MCHC: 33.8 g/dL (ref 30.0–36.0)
MCV: 87.6 fL (ref 78.0–100.0)
Monocytes Absolute: 0.2 10*3/uL (ref 0.1–1.0)
Monocytes Relative: 6 %
NEUTROS PCT: 47 %
Neutro Abs: 1.6 10*3/uL — ABNORMAL LOW (ref 1.7–7.7)
Platelets: 68 10*3/uL — ABNORMAL LOW (ref 150–400)
RBC: 4.26 MIL/uL (ref 4.22–5.81)
RDW: 15.3 % (ref 11.5–15.5)
WBC: 3.2 10*3/uL — ABNORMAL LOW (ref 4.0–10.5)

## 2017-12-14 LAB — COMPREHENSIVE METABOLIC PANEL
ALK PHOS: 60 U/L (ref 38–126)
ALT: 87 U/L — ABNORMAL HIGH (ref 17–63)
ANION GAP: 8 (ref 5–15)
AST: 80 U/L — ABNORMAL HIGH (ref 15–41)
Albumin: 3.5 g/dL (ref 3.5–5.0)
BILIRUBIN TOTAL: 0.9 mg/dL (ref 0.3–1.2)
BUN: 9 mg/dL (ref 6–20)
CALCIUM: 8.7 mg/dL — AB (ref 8.9–10.3)
CO2: 23 mmol/L (ref 22–32)
Chloride: 106 mmol/L (ref 101–111)
Creatinine, Ser: 0.75 mg/dL (ref 0.61–1.24)
Glucose, Bld: 124 mg/dL — ABNORMAL HIGH (ref 65–99)
Potassium: 3.5 mmol/L (ref 3.5–5.1)
Sodium: 137 mmol/L (ref 135–145)
TOTAL PROTEIN: 6.6 g/dL (ref 6.5–8.1)

## 2017-12-14 LAB — URINE CULTURE

## 2017-12-14 LAB — HEPATITIS PANEL, ACUTE
Hep A IgM: NEGATIVE
Hep B C IgM: NEGATIVE
Hepatitis B Surface Ag: NEGATIVE

## 2017-12-14 LAB — MAGNESIUM: MAGNESIUM: 1.6 mg/dL — AB (ref 1.7–2.4)

## 2017-12-14 MED ORDER — MAGNESIUM SULFATE 4 GM/100ML IV SOLN
4.0000 g | Freq: Once | INTRAVENOUS | Status: AC
  Administered 2017-12-14: 4 g via INTRAVENOUS
  Filled 2017-12-14: qty 100

## 2017-12-14 MED ORDER — POTASSIUM CHLORIDE CRYS ER 20 MEQ PO TBCR
40.0000 meq | EXTENDED_RELEASE_TABLET | Freq: Once | ORAL | Status: AC
  Administered 2017-12-14: 40 meq via ORAL
  Filled 2017-12-14: qty 2

## 2017-12-14 MED ORDER — FOLIC ACID 1 MG PO TABS
1.0000 mg | ORAL_TABLET | Freq: Every day | ORAL | Status: DC
  Administered 2017-12-14 – 2017-12-15 (×2): 1 mg via ORAL
  Filled 2017-12-14 (×2): qty 1

## 2017-12-14 MED ORDER — ZOLPIDEM TARTRATE 5 MG PO TABS
5.0000 mg | ORAL_TABLET | Freq: Once | ORAL | Status: AC
  Administered 2017-12-14: 5 mg via ORAL
  Filled 2017-12-14: qty 1

## 2017-12-14 NOTE — Progress Notes (Signed)
Called [facility] for transport, report given to [place], Therapist, sports

## 2017-12-14 NOTE — Progress Notes (Signed)
PROGRESS NOTE    [name]  QPY:[id number] DOB: 03-24-1981 DOA: 12/12/2017 PCP: Patient, No Pcp Per    Brief Narrative:  Pt. with PMH of polysubstance abuse, PVD, HLD, PTSD, depression, schizophrenia with bipolar disorder; admitted on 12/12/2017, presented with complaint of seizure, was found to have alcohol induced seizure. Currently further plan is monitor for withdrawal.      Assessment & Plan:   Principal Problem:   Alcohol use disorder, severe, dependence (HCC) Active Problems:   HYPERCHOLESTEROLEMIA   Essential hypertension   Alcohol abuse   Thrombocytopenia (HCC)   Cocaine abuse (HCC)   Elevated transaminase level   Tobacco use disorder   Alcohol withdrawal (HCC)   Depression, major, severe recurrence (HCC)   Severe episode of recurrent major depressive disorder, without psychotic features ([place])   Melena  #1 alcohol induced seizures with alcohol withdrawal Patient with some clinical improvement.  No further seizures noted per nursing staff and per patient.  Continue IV fluids, Ativan withdrawal protocol, thiamine, folic acid, multivitamin.  Replete electrolytes.  Monitor closely.  2.  Depression with bipolar disorder/schizophrenia Currently stable.  Patient denies any suicidal or homicidal ideation.  Patient seen in consultation by psychiatry and patient started on Seroquel.  BuSpar also started on patient for anxiety.  Patient was noted to be on Depakote for mood stabilizer in the past however discontinued in April secondary to pancytopenia.  Patient now on Lamictal for mood stabilization effect and recommendations with psychiatry.  In weeks wanting to continue Lamictal 25 mg daily, weeks 3 and 450 mg daily, week 500 mg daily, week 6 200 mg daily.  It is recommended for patient's bipolar regimen to continue regimens that do not contain carbamazepine, phenytoin, phenobarbital, primidone, rifampin, lopinavir/ritonavir or valproic acid.  Patient will need outpatient  follow-up with psychiatry.  3.  Hypertension Continue Norvasc.  4.  Polysubstance abuse/cocaine abuse Social work consulted.  5.  Transaminitis Likely secondary to alcohol use.  LFTs slowly trending down.  Acute hepatitis panel negative.  HIV negative.  6.  Reported melena No further melanotic stools noted.  Hemoglobin stable.  7.  Thrombocytopenia Likely secondary to alcohol abuse.  No acute bleeding.  Follow.   DVT prophylaxis: SCDs Code Status: Full Family Communication: Updated patient.  No family at bedside. Disposition Plan: Transfer telemetry.   Consultants:   Psychiatry: Dr. Mariea Clonts 12/13/2017  Procedures:   CT head 12/12/2017  Chest x-ray 12/12/2017    Antimicrobials:   None   Subjective: States feeling better.  No seizures noted per patient and per RN.  Denies any chest pain or shortness of breath.  No bleeding.  States does not usually eat breakfast and has a decreased appetite.  Objective: Vitals:   12/13/17 2000 12/14/17 0000 12/14/17 0717 12/14/17 0845  BP: (!) 153/101 (!) 152/107  (!) 142/98  Pulse: (!) 106 (!) 103  86  Resp:  11  12  Temp:  98.2 F (36.8 C) 98.8 F (37.1 C) 98.8 F (37.1 C)  TempSrc:  Oral Axillary Axillary  SpO2: 97% 99%  100%  Weight:      Height:        Intake/Output Summary (Last 24 hours) at 12/14/2017 1037 Last data filed at 12/14/2017 0700 Gross per 24 hour  Intake 987.5 ml  Output 1875 ml  Net -887.5 ml   Filed Weights   12/12/17 1725  Weight: 68 kg (150 lb)    Examination:  General exam: Minimal tremors. Respiratory system: Clear to auscultation. Respiratory effort  normal. Cardiovascular system: S1 & S2 heard, RRR. No JVD, murmurs, rubs, gallops or clicks. No pedal edema. Gastrointestinal system: Abdomen is nondistended, soft and nontender. No organomegaly or masses felt. Normal bowel sounds heard. Central nervous system: Alert and oriented. No focal neurological deficits. Extremities: Symmetric 5 x 5  power. Skin: No rashes, lesions or ulcers Psychiatry: Judgement and insight appear normal. Mood & affect appropriate.     Data Reviewed: I have personally reviewed following labs and imaging studies  CBC: Recent Labs  Lab 12/12/17 1841 12/13/17 0403 12/14/17 0527  WBC 4.8 3.7* 3.2*  NEUTROABS 3.9  --  1.6*  HGB 14.7 12.5* 12.6*  HCT 41.4 36.3* 37.3*  MCV 86.8 87.5 87.6  PLT 110* 82* 68*   Basic Metabolic Panel: Recent Labs  Lab 12/12/17 1841 12/13/17 0403 12/14/17 0320  NA 134* 137 137  K 4.5 3.6 3.5  CL 97* 101 106  CO2 23 24 23   GLUCOSE 112* 89 124*  BUN 13 13 9   CREATININE 0.95 0.84 0.75  CALCIUM 9.6 8.6* 8.7*  MG  --  1.8 1.6*  PHOS  --  2.5  --    GFR: Estimated Creatinine Clearance: 113 mL/min (by C-G formula based on SCr of 0.75 mg/dL). Liver Function Tests: Recent Labs  Lab 12/12/17 1841 12/13/17 0403 12/14/17 0320  AST 147* 99* 80*  ALT 152* 109* 87*  ALKPHOS 71 53 60  BILITOT 1.4* 1.2 0.9  PROT 8.2* 6.4* 6.6  ALBUMIN 4.4 3.4* 3.5   No results for input(s): LIPASE, AMYLASE in the last 168 hours. No results for input(s): AMMONIA in the last 168 hours. Coagulation Profile: Recent Labs  Lab 12/12/17 1841  INR 0.97   Cardiac Enzymes: No results for input(s): CKTOTAL, CKMB, CKMBINDEX, TROPONINI in the last 168 hours. BNP (last 3 results) No results for input(s): PROBNP in the last 8760 hours. HbA1C: No results for input(s): HGBA1C in the last 72 hours. CBG: No results for input(s): GLUCAP in the last 168 hours. Lipid Profile: No results for input(s): CHOL, HDL, LDLCALC, TRIG, CHOLHDL, LDLDIRECT in the last 72 hours. Thyroid Function Tests: Recent Labs    12/13/17 0403  TSH 1.280   Anemia Panel: No results for input(s): VITAMINB12, FOLATE, FERRITIN, TIBC, IRON, RETICCTPCT in the last 72 hours. Sepsis Labs: No results for input(s): PROCALCITON, LATICACIDVEN in the last 168 hours.  Recent Results (from the past 240 hour(s))  Urine  culture     Status: Abnormal   Collection Time: 12/12/17  7:51 PM  Result Value Ref Range Status   Specimen Description   Final    URINE, RANDOM Performed at [place] [address] [place], [place] [address]    Special Requests   Final    NONE Performed at [facility], [place] [address] [place], [place] [address]    Culture MULTIPLE SPECIES PRESENT, SUGGEST RECOLLECTION (A)  Final   Report Status 12/14/2017 FINAL  Final  MRSA PCR Screening     Status: None   Collection Time: 12/12/17 10:00 PM  Result Value Ref Range Status   MRSA by PCR NEGATIVE NEGATIVE Final    Comment:        The GeneXpert MRSA Assay (FDA approved for NASAL specimens only), is one component of a comprehensive MRSA colonization surveillance program. It is not intended to diagnose MRSA infection nor to guide or monitor treatment for MRSA infections. Performed at [facility], [address] [place], [place]  [address]          Radiology Studies: Dg Chest 2 View  Result Date: 12/12/2017 CLINICAL DATA:  Cough. EXAM: CHEST - 2 VIEW COMPARISON:  Radiographs of Nov 22, 2017. FINDINGS: The heart size and mediastinal contours are within normal limits. Both lungs are clear. No pneumothorax or pleural effusion is noted. The visualized skeletal structures are unremarkable. IMPRESSION: No active cardiopulmonary disease. Electronically Signed   By: Marijo Conception, M.D.   On: 12/12/2017 18:37   Ct Head Wo Contrast  Result Date: 12/12/2017 CLINICAL DATA:  Acute onset of seizure and fall. Hit back of head on kitchen floor. Initial encounter. EXAM: CT HEAD WITHOUT CONTRAST TECHNIQUE: Contiguous axial images were obtained from the base of the skull through the vertex without intravenous contrast. COMPARISON:  CT of the head performed 10/19/2017 FINDINGS: Brain: No evidence of acute infarction, hemorrhage, hydrocephalus, extra-axial collection or mass lesion /  mass effect. Prominence of the sulci suggests mild cortical volume loss. The brainstem and fourth ventricle are within normal limits. The basal ganglia are unremarkable in appearance. The cerebral hemispheres demonstrate grossly normal gray-white differentiation. No mass effect or midline shift is seen. Vascular: No hyperdense vessel or unexpected calcification. Skull: There is no evidence of fracture; visualized osseous structures are unremarkable in appearance. Sinuses/Orbits: The orbits are within normal limits. The paranasal sinuses and mastoid air cells are well-aerated. Other: No significant soft tissue abnormalities are seen. IMPRESSION: 1. No evidence of traumatic intracranial injury or fracture. 2. Mild cortical volume loss noted. Electronically Signed   By: Garald Balding M.D.   On: 12/12/2017 23:35        Scheduled Meds: . amLODipine  5 mg Oral Daily  . busPIRone  10 mg Oral TID  . folic acid  1 mg Oral Daily  . lamoTRIgine  25 mg Oral Daily  . QUEtiapine  100 mg Oral QHS  . thiamine  100 mg Oral Daily   Or  . thiamine  100 mg Intravenous Daily   Continuous Infusions: . sodium chloride 75 mL/hr at 12/13/17 2200     LOS: 2 days    Time spent: [place], MD [facility] Pager [phone]  If 7PM-7AM, please contact night-coverage [email or web address] Password [facility] 12/14/2017, 10:37 AM

## 2017-12-15 DIAGNOSIS — K921 Melena: Secondary | ICD-10-CM

## 2017-12-15 DIAGNOSIS — F10230 Alcohol dependence with withdrawal, uncomplicated: Secondary | ICD-10-CM

## 2017-12-15 DIAGNOSIS — D696 Thrombocytopenia, unspecified: Secondary | ICD-10-CM

## 2017-12-15 DIAGNOSIS — F332 Major depressive disorder, recurrent severe without psychotic features: Secondary | ICD-10-CM

## 2017-12-15 DIAGNOSIS — I10 Essential (primary) hypertension: Secondary | ICD-10-CM

## 2017-12-15 DIAGNOSIS — F17200 Nicotine dependence, unspecified, uncomplicated: Secondary | ICD-10-CM

## 2017-12-15 DIAGNOSIS — F1410 Cocaine abuse, uncomplicated: Secondary | ICD-10-CM

## 2017-12-15 DIAGNOSIS — R740 Nonspecific elevation of levels of transaminase and lactic acid dehydrogenase [LDH]: Secondary | ICD-10-CM

## 2017-12-15 DIAGNOSIS — F1020 Alcohol dependence, uncomplicated: Secondary | ICD-10-CM

## 2017-12-15 LAB — CBC
HEMATOCRIT: 37 % — AB (ref 39.0–52.0)
HEMOGLOBIN: 12.5 g/dL — AB (ref 13.0–17.0)
MCH: 29.8 pg (ref 26.0–34.0)
MCHC: 33.8 g/dL (ref 30.0–36.0)
MCV: 88.3 fL (ref 78.0–100.0)
Platelets: 67 10*3/uL — ABNORMAL LOW (ref 150–400)
RBC: 4.19 MIL/uL — AB (ref 4.22–5.81)
RDW: 15.3 % (ref 11.5–15.5)
WBC: 3.9 10*3/uL — AB (ref 4.0–10.5)

## 2017-12-15 LAB — COMPREHENSIVE METABOLIC PANEL
ALBUMIN: 3.5 g/dL (ref 3.5–5.0)
ALT: 89 U/L — ABNORMAL HIGH (ref 17–63)
ANION GAP: 7 (ref 5–15)
AST: 88 U/L — AB (ref 15–41)
Alkaline Phosphatase: 56 U/L (ref 38–126)
BUN: 10 mg/dL (ref 6–20)
CHLORIDE: 107 mmol/L (ref 101–111)
CO2: 25 mmol/L (ref 22–32)
Calcium: 9.1 mg/dL (ref 8.9–10.3)
Creatinine, Ser: 0.92 mg/dL (ref 0.61–1.24)
GFR calc Af Amer: >60 mL/min (ref >60)
Glucose, Bld: 83 mg/dL (ref 65–99)
POTASSIUM: 4 mmol/L (ref 3.5–5.1)
Sodium: 139 mmol/L (ref 135–145)
TOTAL PROTEIN: 6.5 g/dL (ref 6.5–8.1)
Total Bilirubin: 0.5 mg/dL (ref 0.3–1.2)

## 2017-12-15 LAB — MAGNESIUM: MAGNESIUM: 1.7 mg/dL (ref 1.7–2.4)

## 2017-12-15 MED ORDER — MAGNESIUM OXIDE 400 (241.3 MG) MG PO TABS: 400.0000 mg | ORAL_TABLET | Freq: Two times a day (BID) | ORAL | 0 refills | Status: DC

## 2017-12-15 MED ORDER — LAMOTRIGINE 25 MG PO TABS: 25.0000 mg | ORAL_TABLET | Freq: Every day | ORAL | 0 refills | Status: DC

## 2017-12-15 MED ORDER — MAGNESIUM SULFATE 4 GM/100ML IV SOLN
4.0000 g | Freq: Once | INTRAVENOUS | Status: AC
  Administered 2017-12-15: 4 g via INTRAVENOUS
  Filled 2017-12-15: qty 100

## 2017-12-15 MED ORDER — QUETIAPINE FUMARATE 100 MG PO TABS: 100.0000 mg | ORAL_TABLET | Freq: Every day | ORAL | 0 refills | Status: DC

## 2017-12-15 MED ORDER — AMLODIPINE BESYLATE 5 MG PO TABS: 5.0000 mg | ORAL_TABLET | Freq: Every day | ORAL | 0 refills | Status: DC

## 2017-12-15 MED ORDER — MAGNESIUM OXIDE 400 (241.3 MG) MG PO TABS
400.0000 mg | ORAL_TABLET | Freq: Two times a day (BID) | ORAL | Status: DC
  Administered 2017-12-15: 400 mg via ORAL
  Filled 2017-12-15: qty 1

## 2017-12-15 MED ORDER — BUSPIRONE HCL 10 MG PO TABS: 10.0000 mg | ORAL_TABLET | Freq: Three times a day (TID) | ORAL | 0 refills | Status: DC

## 2017-12-15 NOTE — Progress Notes (Signed)
[name] to be D/C'd Home per MD order.  Discussed prescriptions and follow up appointments with the patient. Prescriptions given to patient, medication list explained in detail. Pt verbalized understanding.  Allergies as of 12/15/2017      Reactions   Codeine Hives, Itching, Swelling, Other (See Comments)   Does not impair breathing, however   Penicillins Swelling, Other (See Comments)   Has patient had a PCN reaction causing immediate rash, facial/tongue/throat swelling, SOB or lightheadedness with hypotension: Yes Has patient had a PCN reaction causing severe rash involving mucus membranes or skin necrosis: Yes Has patient had a PCN reaction that required hospitalization Yes-ed visit Has patient had a PCN reaction occurring within the last 10 years: Yes If all of the above answers are "NO", then may proceed with Cephalosporin use.   Morphine Itching   Coconut Flavor Itching, Swelling   Coconut Oil Other (See Comments)   Cannot take with some of his meds   Grapefruit Concentrate Other (See Comments)   Cannot take with some of his meds   Morphine And Related Itching, Swelling   Oxycodone Itching, Swelling   Norco [hydrocodone-acetaminophen] Itching, Rash      Medication List    STOP taking these medications   gabapentin 300 MG capsule Commonly known as:  NEURONTIN   Melatonin 5 MG Tabs     TAKE these medications   amLODipine 5 MG tablet Commonly known as:  NORVASC Take 1 tablet (5 mg total) by mouth daily.   busPIRone 10 MG tablet Commonly known as:  BUSPAR Take 1 tablet (10 mg total) by mouth 3 (three) times daily.   folic acid 1 MG tablet Commonly known as:  FOLVITE Take 1 tablet (1 mg total) by mouth daily.   lamoTRIgine 25 MG tablet Commonly known as:  LAMICTAL Take 1 tablet (25 mg total) by mouth daily. Start taking on:  12/16/2017   magnesium oxide 400 (241.3 Mg) MG tablet Commonly known as:  MAG-OX Take 1 tablet (400 mg total) by mouth 2 (two) times  daily.   multivitamin with minerals Tabs tablet Take 1 tablet by mouth daily.   QUEtiapine 100 MG tablet Commonly known as:  SEROQUEL Take 1 tablet (100 mg total) by mouth at bedtime.   thiamine 100 MG tablet Take 1 tablet (100 mg total) by mouth daily.       Vitals:   12/15/17 0453 12/15/17 1410  BP: (!) 145/108 (!) 132/92  Pulse: 86 87  Resp: 17 17  Temp: 97.9 F (36.6 C) 98.7 F (37.1 C)  SpO2: 100% 100%    Skin clean, dry and intact without evidence of skin break down, no evidence of skin tears noted. IV catheter discontinued intact. Site without signs and symptoms of complications. Dressing and pressure applied. Pt denies pain at this time. No complaints noted.  An After Visit Summary was printed and given to the patient. Patient escorted via [place], and D/C home via private auto.  [name] 12/15/2017 5:34 PM

## 2017-12-15 NOTE — Progress Notes (Addendum)
4:13 PM Patient completed prescreen with [facility]. Shayla, from [facility] is forwarding clinical info to nursing. If approved patient will have a bed tomorrow per [facility].   Patient gave LCSW permission to fax clinical info to [facility] for treatment referral.  LCSW faxed clinical documentation for SA treatment. [facility] will follow up directly with patient regarding bed availability and acceptance into the program.   Servando Snare, [employer] [place]

## 2017-12-15 NOTE — Discharge Summary (Signed)
Physician Discharge Summary  [name] JME:[id number] DOB: Sep 21, 1980 DOA: 12/12/2017  PCP: Patient, No Pcp [name]  Admit date: 12/12/2017 Discharge date: 12/15/2017  Time spent: 60 minutes  Recommendations for Outpatient Follow-up:  1. Follow-up with [facility].  Patient given information by social work.  Patient will be called from [facility] regarding bed availability and acceptance into program.  [name] bipolar/schizophrenic/depression medication will need to be further adjusted [name] psychiatrist at [facility].  Patient was started on a Lamictal titration and will be discharged on 2 weeks worth of Lamictal 25 mg daily and further adjustments will be deferred to psychiatrist at [facility]. 2. Follow-up with PCP in 2 weeks.  On follow-up patient will need a comprehensive metabolic profile done to follow-up on electrolytes, LFTs and renal function.  Patient also needs a magnesium level checked.   Discharge Diagnoses:  Principal Problem:   Alcohol use disorder, severe, dependence (HCC) Active Problems:   HYPERCHOLESTEROLEMIA   Essential hypertension   Alcohol abuse   Thrombocytopenia (HCC)   Cocaine abuse (HCC)   Elevated transaminase level   Tobacco use disorder   Alcohol withdrawal (HCC)   Depression, major, severe recurrence (HCC)   Severe episode of recurrent major depressive disorder, without psychotic features ([place])   Melena   Alcohol withdrawal seizure without complication ([place])   Discharge Condition: Stable and improved  Diet recommendation: Regular  Filed Weights   12/12/17 1725  Weight: 68 kg (150 lb)    History of present illness:  [name] is a 37 y.o. male with medical history significant of polysubstance abuse(alcohol, cocaine, tobacco),PVD, HLD, history of breast CA, renal CA, PTSD, depression with history of multiple suicide attempts, schizophrenia, bipolar disorder, CAD Presented  with desire to be detoxed from alcohol with this plan to place him in  facility but had alcohol withdrawal seizure today EMS was called heart rate 120 blood pressure 150/100 CBG 116.  Had some cough and vomiting after the seizure.  No headaches no visual disturbances no chest pain no shortness of breath.  Episode was witnessed.  Described as jerking rhythmic. Apparently had patient have had alcohol withdrawal seizures in the past in 2017. He drank about 3 bottles of wine a day and today decided to quit last alcoholic drink was this morning.  But started to get very shaky diaphoretic had some nausea and vomiting and started to have hallucinations.  Patient did endorse having melena for the past few weeks and have had prior GI bleeds which was similar. Reported that on day of admission he hit his head on the floor and its still huts severely.  denies suicidal ideation.  Last time was admitted on 7 May secondary to suicide attempt when he attempted to take 15 pills of trazodone a large amount of Listerine Regarding pertinent Chronic problems: 2 breast cancer status post mastectomy History of peripheral vascular disease requiring femoral popliteal bypass History of renal mass status post partial left nephrectomy While in ER: Initial CIWA protocol scale was 21 Now feeling better but reports feeling itchy similar to prior withdrawal  Following Medications were ordered in ER: Medications  LORazepam (ATIVAN) injection 0-4 mg (has no administration in time range)    Or  LORazepam (ATIVAN) tablet 0-4 mg (has no administration in time range)  LORazepam (ATIVAN) injection 0-4 mg (has no administration in time range)    Or  LORazepam (ATIVAN) tablet 0-4 mg (has no administration in time range)  thiamine (VITAMIN B-1) tablet 100 mg ( Oral See Alternative  12/12/17 1849)    Or  thiamine (B-1) injection 100 mg (100 mg Intravenous Given 12/12/17 1849)  LORazepam (ATIVAN) injection 2 mg (2 mg Intravenous Given 12/12/17 1843)  sodium chloride 0.9 % bolus 1,000 mL (1,000 mLs  Intravenous New Bag/Given 12/12/17 1848)       Hospital Course:  1 alcohol induced seizures with alcohol withdrawal Patient was admitted with alcohol induced seizures felt secondary to alcohol withdrawal.  Patient was placed in the stepdown unit and placed on the Ativan withdrawal protocol and doses adjusted accordingly.  Patient was hydrated with IV fluids and monitored.  Patient was also placed on thiamine, folic acid and multivitamin.  [name] electrolytes of potassium and magnesium were repleted.  Patient improved clinically on a daily basis.  Patient was subsequently transferred to the telemetry floor.  Patient did not have any further seizures for the rest of the hospitalization.  Patient was discharged home in stable and improved condition.  Patient was given information by social work concerning alcohol rehabilitation and patient will follow up with [facility] in the outpatient setting.  Outpatient follow-up.   2.  Depression with bipolar disorder/schizophrenia Patient denied any suicidal or homicidal ideation.  Patient seen in consultation by psychiatry and patient started on Seroquel.  BuSpar also started on patient for anxiety. Patient was noted to be on Depakote for mood stabilizer in the past however discontinued in April secondary to pancytopenia.  Patient was subsequently started on Lamictal for mood stabilization effect and [name] recommendations with psychiatry.  In weeks 1 and 2 to continue Lamictal 25 mg daily, weeks 3 and 4 to continue lamictal 50 mg daily, week 5,  lamictal100 mg daily, week 6 200 mg daily.  It was recommended for patient's bipolar regimen to continue regimens that do not contain carbamazepine, phenytoin, phenobarbital, primidone, rifampin, lopinavir/ritonavir or valproic acid.  Patient will need outpatient follow-up with psychiatry.  3.  Hypertension Maintained on Norvasc throughout the hospitalization.  Outpatient follow-up.  4.  Polysubstance abuse/cocaine  abuse Social work consulted and patient given information on polysubstance use..  5.  Transaminitis Likely secondary to alcohol use.  LFTs slowly trended down.  Acute hepatitis panel negative.  HIV negative.  Outpatient follow-up with PCP.  6.  Reported melena No further melanotic stools noted.  Hemoglobin remianed stable.  7.  Thrombocytopenia Likely secondary to alcohol abuse.  No acute bleeding.  Remained stable.     Procedures:  CT head 12/12/2017  Chest x-ray 12/12/2017      Consultations:  Psychiatry: Dr. Mariea Clonts 12/13/2017   Discharge Exam: Vitals:   12/15/17 0453 12/15/17 1410  BP: (!) 145/108 (!) 132/92  Pulse: 86 87  Resp: 17 17  Temp: 97.9 F (36.6 C) 98.7 F (37.1 C)  SpO2: 100% 100%    General: NAD Cardiovascular: RRR Respiratory: CTAB  Discharge Instructions   Discharge Instructions    Diet general   Complete by:  As directed    Increase activity slowly   Complete by:  As directed      Allergies as of 12/15/2017      Reactions   Codeine Hives, Itching, Swelling, Other (See Comments)   Does not impair breathing, however   Penicillins Swelling, Other (See Comments)   Has patient had a PCN reaction causing immediate rash, facial/tongue/throat swelling, SOB or lightheadedness with hypotension: Yes Has patient had a PCN reaction causing severe rash involving mucus membranes or skin necrosis: Yes Has patient had a PCN reaction that required hospitalization Yes-ed visit Has  patient had a PCN reaction occurring within the last 10 years: Yes If all of the above answers are "NO", then may proceed with Cephalosporin use.   Morphine Itching   Coconut Flavor Itching, Swelling   Coconut Oil Other (See Comments)   Cannot take with some of his meds   Grapefruit Concentrate Other (See Comments)   Cannot take with some of his meds   Morphine And Related Itching, Swelling   Oxycodone Itching, Swelling   Norco [hydrocodone-acetaminophen] Itching, Rash       Medication List    STOP taking these medications   gabapentin 300 MG capsule Commonly known as:  NEURONTIN   Melatonin 5 MG Tabs     TAKE these medications   amLODipine 5 MG tablet Commonly known as:  NORVASC Take 1 tablet (5 mg total) by mouth daily.   busPIRone 10 MG tablet Commonly known as:  BUSPAR Take 1 tablet (10 mg total) by mouth 3 (three) times daily.   folic acid 1 MG tablet Commonly known as:  FOLVITE Take 1 tablet (1 mg total) by mouth daily.   lamoTRIgine 25 MG tablet Commonly known as:  LAMICTAL Take 1 tablet (25 mg total) by mouth daily. Start taking on:  12/16/2017   magnesium oxide 400 (241.3 Mg) MG tablet Commonly known as:  MAG-OX Take 1 tablet (400 mg total) by mouth 2 (two) times daily.   multivitamin with minerals Tabs tablet Take 1 tablet by mouth daily.   QUEtiapine 100 MG tablet Commonly known as:  SEROQUEL Take 1 tablet (100 mg total) by mouth at bedtime.   thiamine 100 MG tablet Take 1 tablet (100 mg total) by mouth daily.      Allergies  Allergen Reactions  . Codeine Hives, Itching, Swelling and Other (See Comments)    Does not impair breathing, however  . Penicillins Swelling and Other (See Comments)    Has patient had a PCN reaction causing immediate rash, facial/tongue/throat swelling, SOB or lightheadedness with hypotension: Yes Has patient had a PCN reaction causing severe rash involving mucus membranes or skin necrosis: Yes Has patient had a PCN reaction that required hospitalization Yes-ed visit Has patient had a PCN reaction occurring within the last 10 years: Yes If all of the above answers are "NO", then may proceed with Cephalosporin use.   Marland Kitchen Morphine Itching  . Coconut Flavor Itching and Swelling  . Coconut Oil Other (See Comments)    Cannot take with some of his meds  . Grapefruit Concentrate Other (See Comments)    Cannot take with some of his meds  . Morphine And Related Itching and Swelling  . Oxycodone  Itching and Swelling  . Norco [Hydrocodone-Acetaminophen] Itching and Rash   Follow-up Information    [facility] Follow up.   Specialty:  [facility] information: [place] [id number] [phone]        PCP. Schedule an appointment as soon as possible for a visit.   Why:  f/u in 2 weeks.           The results of significant diagnostics from this hospitalization (including imaging, microbiology, ancillary and laboratory) are listed below for reference.    Significant Diagnostic Studies: Dg Chest 2 View  Result Date: 12/12/2017 CLINICAL DATA:  Cough. EXAM: CHEST - 2 VIEW COMPARISON:  Radiographs of Nov 22, 2017. FINDINGS: The heart size and mediastinal contours are within normal limits. Both lungs are clear. No pneumothorax or pleural effusion is noted. The  visualized skeletal structures are unremarkable. IMPRESSION: No active cardiopulmonary disease. Electronically Signed   By: Marijo Conception, M.D.   On: 12/12/2017 18:37   Ct Head Wo Contrast  Result Date: 12/12/2017 CLINICAL DATA:  Acute onset of seizure and fall. Hit back of head on kitchen floor. Initial encounter. EXAM: CT HEAD WITHOUT CONTRAST TECHNIQUE: Contiguous axial images were obtained from the base of the skull through the vertex without intravenous contrast. COMPARISON:  CT of the head performed 10/19/2017 FINDINGS: Brain: No evidence of acute infarction, hemorrhage, hydrocephalus, extra-axial collection or mass lesion / mass effect. Prominence of the sulci suggests mild cortical volume loss. The brainstem and fourth ventricle are within normal limits. The basal ganglia are unremarkable in appearance. The cerebral hemispheres demonstrate grossly normal gray-white differentiation. No mass effect or midline shift is seen. Vascular: No hyperdense vessel or unexpected calcification. Skull: There is no evidence of fracture; visualized osseous structures are unremarkable in appearance. Sinuses/Orbits:  The orbits are within normal limits. The paranasal sinuses and mastoid air cells are well-aerated. Other: No significant soft tissue abnormalities are seen. IMPRESSION: 1. No evidence of traumatic intracranial injury or fracture. 2. Mild cortical volume loss noted. Electronically Signed   By: Garald Balding M.D.   On: 12/12/2017 23:35   US Abdomen Complete  Result Date: 11/25/2017 CLINICAL DATA:  Abnormal liver function tests. EXAM: ABDOMEN ULTRASOUND COMPLETE COMPARISON:  Body CT 10/20/2017 FINDINGS: Gallbladder: No gallstones or wall thickening visualized. No sonographic Murphy sign noted by sonographer. Common bile duct: Diameter: 4.0 mm Liver: No focal lesion identified. Diffusely increased parenchymal echogenicity. Portal vein is patent on color Doppler imaging with normal direction of blood flow towards the liver. IVC: No abnormality visualized. Pancreas: Visualized portion unremarkable. Spleen: Size and appearance within normal limits. Right Kidney: Length: 11.2 cm. Echogenicity within normal limits. No mass or hydronephrosis visualized. Left Kidney: Length: 9.7 cm. Smaller due to prior partial nephrectomy. No mass or hydronephrosis visualized. Abdominal aorta: No aneurysm visualized. Other findings: None. IMPRESSION: Hepatic steatosis. Smaller left kidney, consistent with the history of prior partial nephrectomy. Electronically Signed   By: Fidela Salisbury M.D.   On: 11/25/2017 18:09   Dg Abdomen Acute W/chest  Result Date: 11/22/2017 CLINICAL DATA:  Overdose today. EXAM: DG ABDOMEN ACUTE W/ 1V CHEST COMPARISON:  None. FINDINGS: Single-view of the chest demonstrates clear lungs and normal heart size. No pneumothorax or pleural effusion. Postoperative change of repair of left AC joint separation noted. Two views of the abdomen show normal bowel gas pattern. No free intraperitoneal air. No unexpected abdominal calcification or focal bony abnormality. IMPRESSION: No acute abnormality chest or  abdomen. Electronically Signed   By: Inge Rise M.D.   On: 11/22/2017 12:59    Microbiology: Recent Results (from the past 240 hour(s))  Urine culture     Status: Abnormal   Collection Time: 12/12/17  7:51 PM  Result Value Ref Range Status   Specimen Description   Final    URINE, RANDOM Performed at [facility] [address], [place], [place] [address]    Special Requests   Final    NONE Performed at [facility], [place] [name] [place], [place] [address]    Culture MULTIPLE SPECIES PRESENT, SUGGEST RECOLLECTION (A)  Final   Report Status 12/14/2017 FINAL  Final  MRSA PCR Screening     Status: None   Collection Time: 12/12/17 10:00 PM  Result Value Ref Range Status   MRSA by PCR NEGATIVE  NEGATIVE Final    Comment:        The GeneXpert MRSA Assay (FDA approved for NASAL specimens only), is one component of a comprehensive MRSA colonization surveillance program. It is not intended to diagnose MRSA infection nor to guide or monitor treatment for MRSA infections. Performed at [facility], [place] [address], [place], [place] [address]      Labs: Basic Metabolic Panel: Recent Labs  Lab 12/12/17 1841 12/13/17 0403 12/14/17 0320 12/15/17 0622  NA 134* 137 137 139  K 4.5 3.6 3.5 4.0  CL 97* 101 106 107  CO2 23 24 23 25   GLUCOSE 112* 89 124* 83  BUN 13 13 9 10   CREATININE 0.95 0.84 0.75 0.92  CALCIUM 9.6 8.6* 8.7* 9.1  MG  --  1.8 1.6* 1.7  PHOS  --  2.5  --   --    Liver Function Tests: Recent Labs  Lab 12/12/17 1841 12/13/17 0403 12/14/17 0320 12/15/17 0622  AST 147* 99* 80* 88*  ALT 152* 109* 87* 89*  ALKPHOS 71 53 60 56  BILITOT 1.4* 1.2 0.9 0.5  PROT 8.2* 6.4* 6.6 6.5  ALBUMIN 4.4 3.4* 3.5 3.5   No results for input(s): LIPASE, AMYLASE in the last 168 hours. No results for input(s): AMMONIA in the last 168 hours. CBC: Recent Labs  Lab 12/12/17 1841 12/13/17 0403 12/14/17 0527  12/15/17 0622  WBC 4.8 3.7* 3.2* 3.9*  NEUTROABS 3.9  --  1.6*  --   HGB 14.7 12.5* 12.6* 12.5*  HCT 41.4 36.3* 37.3* 37.0*  MCV 86.8 87.5 87.6 88.3  PLT 110* 82* 68* 67*   Cardiac Enzymes: No results for input(s): CKTOTAL, CKMB, CKMBINDEX, TROPONINI in the last 168 hours. BNP: BNP (last 3 results) No results for input(s): BNP in the last 8760 hours.  ProBNP (last 3 results) No results for input(s): PROBNP in the last 8760 hours.  CBG: No results for input(s): GLUCAP in the last 168 hours.     Signed:  Irine Seal MD.  [facility] 12/15/2017, 5:07 PM

## 2017-12-22 ENCOUNTER — Encounter (HOSPITAL_COMMUNITY): Payer: Self-pay | Admitting: *Deleted

## 2017-12-22 ENCOUNTER — Other Ambulatory Visit: Payer: Self-pay

## 2017-12-22 ENCOUNTER — Emergency Department (HOSPITAL_COMMUNITY): Payer: Self-pay

## 2017-12-22 ENCOUNTER — Emergency Department (HOSPITAL_COMMUNITY)
Admission: EM | Admit: 2017-12-22 | Discharge: 2017-12-22 | Disposition: A | Discharge status: Home / Self Care | Payer: Self-pay | Attending: Emergency Medicine | Admitting: Emergency Medicine

## 2017-12-22 DIAGNOSIS — R61 Generalized hyperhidrosis: Secondary | ICD-10-CM | POA: Insufficient documentation

## 2017-12-22 DIAGNOSIS — Z853 Personal history of malignant neoplasm of breast: Secondary | ICD-10-CM | POA: Insufficient documentation

## 2017-12-22 DIAGNOSIS — R079 Chest pain, unspecified: Secondary | ICD-10-CM | POA: Insufficient documentation

## 2017-12-22 DIAGNOSIS — Z85528 Personal history of other malignant neoplasm of kidney: Secondary | ICD-10-CM | POA: Insufficient documentation

## 2017-12-22 DIAGNOSIS — Z9013 Acquired absence of bilateral breasts and nipples: Secondary | ICD-10-CM | POA: Insufficient documentation

## 2017-12-22 DIAGNOSIS — R000 Tachycardia, unspecified: Secondary | ICD-10-CM | POA: Insufficient documentation

## 2017-12-22 DIAGNOSIS — I10 Essential (primary) hypertension: Secondary | ICD-10-CM | POA: Insufficient documentation

## 2017-12-22 DIAGNOSIS — R110 Nausea: Secondary | ICD-10-CM | POA: Insufficient documentation

## 2017-12-22 DIAGNOSIS — I2510 Atherosclerotic heart disease of native coronary artery without angina pectoris: Secondary | ICD-10-CM | POA: Insufficient documentation

## 2017-12-22 DIAGNOSIS — Z79899 Other long term (current) drug therapy: Secondary | ICD-10-CM | POA: Insufficient documentation

## 2017-12-22 DIAGNOSIS — Z87891 Personal history of nicotine dependence: Secondary | ICD-10-CM | POA: Insufficient documentation

## 2017-12-22 LAB — PATHOLOGIST SMEAR REVIEW

## 2017-12-22 LAB — COMPREHENSIVE METABOLIC PANEL
ALT: 79 U/L — ABNORMAL HIGH (ref 17–63)
ANION GAP: 11 (ref 5–15)
AST: 74 U/L — ABNORMAL HIGH (ref 15–41)
Albumin: 2.8 g/dL — ABNORMAL LOW (ref 3.5–5.0)
Alkaline Phosphatase: 50 U/L (ref 38–126)
BUN: 6 mg/dL (ref 6–20)
CHLORIDE: 104 mmol/L (ref 101–111)
CO2: 23 mmol/L (ref 22–32)
Calcium: 8.3 mg/dL — ABNORMAL LOW (ref 8.9–10.3)
Creatinine, Ser: 0.83 mg/dL (ref 0.61–1.24)
GFR calc non Af Amer: >60 mL/min (ref >60)
Glucose, Bld: 166 mg/dL — ABNORMAL HIGH (ref 65–99)
POTASSIUM: 3.3 mmol/L — AB (ref 3.5–5.1)
SODIUM: 138 mmol/L (ref 135–145)
Total Bilirubin: 0.6 mg/dL (ref 0.3–1.2)
Total Protein: 5.4 g/dL — ABNORMAL LOW (ref 6.5–8.1)

## 2017-12-22 LAB — CBC WITH DIFFERENTIAL/PLATELET
BASOS ABS: 0 10*3/uL (ref 0.0–0.1)
Basophils Relative: 1 %
EOS ABS: 0.1 10*3/uL (ref 0.0–0.7)
Eosinophils Relative: 2 %
HCT: 31.1 % — ABNORMAL LOW (ref 39.0–52.0)
Hemoglobin: 10.4 g/dL — ABNORMAL LOW (ref 13.0–17.0)
LYMPHS ABS: 1.4 10*3/uL (ref 0.7–4.0)
LYMPHS PCT: 60 %
MCH: 30 pg (ref 26.0–34.0)
MCHC: 33.4 g/dL (ref 30.0–36.0)
MCV: 89.6 fL (ref 78.0–100.0)
Monocytes Absolute: 0.3 10*3/uL (ref 0.1–1.0)
Monocytes Relative: 11 %
NEUTROS ABS: 0.7 10*3/uL — AB (ref 1.7–7.7)
Neutrophils Relative %: 26 %
Platelets: 182 10*3/uL (ref 150–400)
RBC: 3.47 MIL/uL — AB (ref 4.22–5.81)
RDW: 15.5 % (ref 11.5–15.5)
WBC: 2.5 10*3/uL — AB (ref 4.0–10.5)

## 2017-12-22 LAB — I-STAT TROPONIN, ED
TROPONIN I, POC: 0 ng/mL (ref 0.00–0.08)
TROPONIN I, POC: 0 ng/mL (ref 0.00–0.08)

## 2017-12-22 LAB — POC OCCULT BLOOD, ED: FECAL OCCULT BLD: NEGATIVE

## 2017-12-22 LAB — ETHANOL: ALCOHOL ETHYL (B): 87 mg/dL — AB (ref <10)

## 2017-12-22 MED ORDER — IOPAMIDOL (ISOVUE-370) INJECTION 76%
100.0000 mL | Freq: Once | INTRAVENOUS | Status: AC
  Administered 2017-12-22: 100 mL via INTRAVENOUS

## 2017-12-22 MED ORDER — LORAZEPAM 2 MG/ML IJ SOLN
1.0000 mg | Freq: Once | INTRAMUSCULAR | Status: AC
  Administered 2017-12-22: 1 mg via INTRAVENOUS
  Filled 2017-12-22: qty 1

## 2017-12-22 MED ORDER — IOPAMIDOL (ISOVUE-370) INJECTION 76%
INTRAVENOUS | Status: AC
  Filled 2017-12-22: qty 100

## 2017-12-22 MED ORDER — ASPIRIN 81 MG PO CHEW
324.0000 mg | CHEWABLE_TABLET | Freq: Once | ORAL | Status: AC
  Administered 2017-12-22: 324 mg via ORAL
  Filled 2017-12-22: qty 4

## 2017-12-22 MED ORDER — IOPAMIDOL (ISOVUE-370) INJECTION 76%
100.0000 mL | Freq: Once | INTRAVENOUS | Status: AC | PRN
  Administered 2017-12-22: 100 mL via INTRAVENOUS

## 2017-12-22 MED ORDER — DIPHENHYDRAMINE HCL 50 MG/ML IJ SOLN
25.0000 mg | Freq: Once | INTRAMUSCULAR | Status: AC
  Administered 2017-12-22: 25 mg via INTRAVENOUS
  Filled 2017-12-22: qty 1

## 2017-12-22 MED ORDER — FENTANYL CITRATE (PF) 100 MCG/2ML IJ SOLN
50.0000 ug | Freq: Once | INTRAMUSCULAR | Status: AC
  Administered 2017-12-22: 50 ug via INTRAVENOUS
  Filled 2017-12-22: qty 2

## 2017-12-22 NOTE — Discharge Instructions (Addendum)
You can take Tylenol or Ibuprofen as directed for pain. You can alternate Tylenol and Ibuprofen every 4 hours. If you take Tylenol at 1pm, then you can take Ibuprofen at 5pm. Then you can take Tylenol again at 9pm.   As we discussed there is some soft tissue swelling noted on her CTA of your chest.  This needs to be followed up either by her primary care doctor.  He can also follow-up with the [facility].  Follow-up with your primary care doctor in the next 2 to 4 days for further evaluation.  Return to the Emergency Department immediately if you experiencing worsening chest pain, difficulty breathing, nausea/vomiting, get very sweaty, headache or any other worsening or concerning symptoms.

## 2017-12-22 NOTE — ED Notes (Signed)
Walked patient to the bathroom patient is now back in bed on the monitor patient is resting with call bell in reach

## 2017-12-22 NOTE — ED Notes (Signed)
Patient transported to X-ray 

## 2017-12-22 NOTE — ED Triage Notes (Signed)
Patient presents to ed via [facility] he was awaken with sharp stabbing chest pain this am. States pain is non-radiating . C/o difficulty swallowing however EMS states patient was drinking water and wasn't having any problems swallowing. Patient is alert oriented.

## 2017-12-22 NOTE — ED Notes (Signed)
States he is still itching

## 2017-12-22 NOTE — ED Provider Notes (Signed)
[facility] Provider Note   CSN: [id number] Arrival date & time: 12/22/17  3818     History   Chief Complaint Chief Complaint  Patient presents with  . Chest Pain    HPI [name] is a 37 y.o. male medical history of angina, anxiety, bipolar 1, breast cancer, CAD, alcohol abuse, pink otitis who presents for evaluation of chest pain that began 2 hours prior to ED arrival.  Patient reports that he woke up and started experiencing left-sided chest pain that is "sharp."  He states that the pain does not radiate.  It is worse with deep inspiration and with exertion.  He states that he does feel like he is having some shortness of breath with the pain.  He states that it "hurts and is hard to get a full breath in." He states that initially when pain began, he was diaphoretic and nauseous.  Patient reports that that has slightly improved since coming to the ED.  Patient also states that he feels like he is having some difficulty swallowing.  He states he is able to tolerate his secretions and liquids but states that it is rotating to his throat.  He does state he started a new medication yesterday prior to onset of symptoms.  Patient reports that he was recently started on Seroquel, Lamictal.  Patient does report that he was recently hospitalized for the last week for due to seizures.  He states he has had a history of a blockage in his popliteal artery but denies any blood clots in his legs or lungs.  Patient states that he does smoke but states that he does not smoke every day.  He does report a history of cocaine use.  Last used 2 weeks ago.  No IV drug use.  Patient denies any recent fevers, congestions, cough, abdominal pain, nausea/vomiting.  The history is provided by the patient.    Past Medical History:  Diagnosis Date  . Angina   . Anxiety    panic attack  . Bipolar 1 disorder ([place])   . Breast CA ([place]) dx'd 2009   bil w/ bil masectomy and oral  meds  . Cancer [facility])    kidney cancer  . Coronary artery disease   . Depression   . H/O suicide attempt 2015   overdose  . Headache(784.0)   . Hypercholesteremia   . Hypertension   . Liver cirrhosis ([place])   . Pancreatitis   . Peripheral vascular disease [facility]) April 2011   Left Pop  . Schizophrenia ([place])   . Seizures ([place])    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  . Shortness of breath     Patient Active Problem List   Diagnosis Date Noted  . Alcohol withdrawal seizure without complication ([place])   . Melena 12/12/2017  . Emesis 10/20/2017  . Displaced fracture of lateral end of left clavicle, initial encounter for closed fracture 08/05/2017  . Coracoclavicular (ligament) sprain and strain, left, initial encounter 08/05/2017  . Closed dislocation of acromioclavicular joint, initial encounter 08/05/2017  . Insomnia   . Hypertension   . Severe episode of recurrent major depressive disorder, without psychotic features ([place])   . Depression, major, severe recurrence ([place]) 12/30/2015  . Substance induced mood disorder ([place]) 12/02/2015  . Mood disorder in conditions classified elsewhere   . Malnutrition of moderate degree 09/24/2015  . Alcohol withdrawal ([place]) 09/23/2015  . Tobacco use disorder 07/16/2015  . Elevated transaminase level   . Drug  overdose, intentional ([place]) 07/12/2015  . Alcohol use disorder, severe, dependence ([place]) 04/15/2015  . Cocaine abuse with cocaine-induced mood disorder ([place]) 04/11/2015  . Overdose 04/10/2015  . Severe recurrent major depressive disorder with psychotic features ([place])   . Severe major depression with psychotic features ([place]) 09/11/2014  . Alcohol-induced mood disorder ([place]) 09/10/2014  . Suicidal ideation   . Tylenol overdose   . Polysubstance abuse ([place])   . Overdose of acetaminophen 08/03/2014  . Overdose by acetaminophen 08/03/2014  . Cocaine abuse ([place]) 04/16/2014  . Thrombocytopenia ([place]) 04/15/2014  . Urinary tract infection, site not  specified 04/15/2014  . Chest pain, unspecified 04/15/2014  . D-dimer, elevated 04/15/2014  . Transaminitis 09/24/2013  . Cocaine dependence ([place]) 09/20/2013  . S/p nephrectomy 04/28/2013  . Seizure ([place]) 03/15/2013  . Syncope 01/02/2013  . Leukocytopenia, unspecified 01/02/2013  . Left kidney mass 12/24/2012  . PTSD (post-traumatic stress disorder) 07/06/2012  . Episode of recurrent major depressive disorder ([place]) 06/29/2012    Class: Acute  . Peripheral vascular disease ([place]) 01/14/2012  . Alcohol abuse 10/13/2011  . SEIZURE DISORDER 10/03/2008  . Elevated LFTs 12/29/2007  . HYPERCHOLESTEROLEMIA 03/21/2007  . Essential hypertension 03/21/2007    Past Surgical History:  Procedure Laterality Date  . BREAST SURGERY    . BREAST SURGERY     bilateral breast silocone  removal  . CHEST SURGERY    . left kidney removal    . left leg surgery     "popiteal artery clogged"  . MASTECTOMY Bilateral   . NEPHRECTOMY Left   . ORIF CLAVICULAR FRACTURE Left 08/10/2017   Procedure: OPEN REDUCTION INTERNAL FIXATION (ORIF) LEFT CLAVICLE FRACTURE WITH RECONSTRUCTION OF CORACOCLAVICULAR LIGAMENT;  Surgeon: Leandrew Koyanagi, MD;  Location: [place];  Service: Orthopedics;  Laterality: Left;  . RECONSTRUCTION OF CORACOCLAVICULAR LIGAMENT Left 08/10/2017   Procedure: RECONSTRUCTION OF CORACOCLAVICULAR LIGAMENT;  Surgeon: Leandrew Koyanagi, MD;  Location: [place];  Service: Orthopedics;  Laterality: Left;        Home Medications    Prior to Admission medications   Medication Sig Start Date End Date Taking? Authorizing Provider  amLODipine (NORVASC) 5 MG tablet Take 1 tablet (5 mg total) by mouth daily. 12/15/17  Yes Eugenie Filler, MD  busPIRone (BUSPAR) 10 MG tablet Take 1 tablet (10 mg total) by mouth 3 (three) times daily. 12/15/17  Yes Eugenie Filler, MD  QUEtiapine (SEROQUEL) 100 MG tablet Take 1 tablet (100 mg total) by mouth at bedtime. 12/15/17  Yes Eugenie Filler, MD  folic acid (FOLVITE)  1 MG tablet Take 1 tablet (1 mg total) by mouth daily. Patient not taking: Reported on 12/22/2017 11/27/17   Hosie Poisson, MD  lamoTRIgine (LAMICTAL) 25 MG tablet Take 1 tablet (25 mg total) by mouth daily. Patient not taking: Reported on 12/22/2017 12/16/17   Eugenie Filler, MD  magnesium oxide (MAG-OX) 400 (241.3 Mg) MG tablet Take 1 tablet (400 mg total) by mouth 2 (two) times daily. Patient not taking: Reported on 12/22/2017 12/15/17   Eugenie Filler, MD  Multiple Vitamin (MULTIVITAMIN WITH MINERALS) TABS tablet Take 1 tablet by mouth daily. Patient not taking: Reported on 12/22/2017 11/27/17   Hosie Poisson, MD  thiamine 100 MG tablet Take 1 tablet (100 mg total) by mouth daily. Patient not taking: Reported on 12/22/2017 11/27/17   Hosie Poisson, MD    Family History Family History  Problem Relation Age of Onset  . Stroke Other   . Cancer Other   .  Hyperlipidemia Mother   . Hypertension Mother     Social History Social History   Tobacco Use  . Smoking status: Former Research scientist (life sciences)  . Smokeless tobacco: Never Used  . Tobacco comment: smoked on and off 16 years- quit 2016  Substance Use Topics  . Alcohol use: Yes  . Drug use: No    Frequency: 1.0 times per week    Types: "Crack" cocaine, Cocaine     Allergies   Codeine; Penicillins; Morphine; Coconut flavor; Coconut oil; Grapefruit concentrate; Morphine and related; Oxycodone; and Norco [hydrocodone-acetaminophen]   Review of Systems Review of Systems  Constitutional: Negative for chills and fever.  HENT: Positive for trouble swallowing. Negative for congestion and sore throat.   Respiratory: Positive for shortness of breath. Negative for cough.   Cardiovascular: Positive for chest pain. Negative for leg swelling.  Gastrointestinal: Negative for abdominal pain, diarrhea, nausea and vomiting.  Genitourinary: Negative for dysuria and hematuria.  Musculoskeletal: Negative for back pain and neck pain.  Skin: Negative for rash.    Neurological: Negative for dizziness, weakness, numbness and headaches.  All other systems reviewed and are negative.    Physical Exam Updated Vital Signs BP (!) 145/104   Pulse 94   Resp 15   SpO2 98%   Physical Exam  Constitutional: He is oriented to person, place, and time. He appears well-developed and well-nourished.  HENT:  Head: Normocephalic and atraumatic.  Mouth/Throat: Oropharynx is clear and moist and mucous membranes are normal.  No oral angioedema.  Airways patent, phonation is intact.  Posterior oropharynx is clear.  Eyes: Pupils are equal, round, and reactive to light. Conjunctivae, EOM and lids are normal.  Neck: Full passive range of motion without pain.  Cardiovascular: Regular rhythm, normal heart sounds and normal pulses. Tachycardia present. Exam reveals no gallop and no friction rub.  No murmur heard. Pulmonary/Chest: Effort normal and breath sounds normal.  Lungs clear to auscultation bilaterally.  Symmetric chest rise.  No wheezing, rales, rhonchi.  Abdominal: Soft. Normal appearance. There is no tenderness. There is no rigidity and no guarding.  Genitourinary: Rectum normal. Rectal exam shows guaiac negative stool.  Genitourinary Comments: The exam was performed with a chaperone present. Normal rectal exam. No blood noted.   Musculoskeletal: Normal range of motion.  Bilateral lower extremities are symmetric in appearance.  Neurological: He is alert and oriented to person, place, and time.  Skin: Skin is warm and dry. Capillary refill takes less than 2 seconds.  Psychiatric: He has a normal mood and affect. His speech is normal.  Nursing note and vitals reviewed.    ED Treatments / Results  Labs (all labs ordered are listed, but only abnormal results are displayed) Labs Reviewed  CBC WITH DIFFERENTIAL/PLATELET - Abnormal; Notable for the following components:      Result Value   WBC 2.5 (*)    RBC 3.47 (*)    Hemoglobin 10.4 (*)    HCT 31.1 (*)     Neutro Abs 0.7 (*)    All other components within normal limits  ETHANOL - Abnormal; Notable for the following components:   Alcohol, Ethyl (B) 87 (*)    All other components within normal limits  COMPREHENSIVE METABOLIC PANEL - Abnormal; Notable for the following components:   Potassium 3.3 (*)    Glucose, Bld 166 (*)    Calcium 8.3 (*)    Total Protein 5.4 (*)    Albumin 2.8 (*)    AST 74 (*)  ALT 79 (*)    All other components within normal limits  PATHOLOGIST SMEAR REVIEW  I-STAT TROPONIN, ED  I-STAT TROPONIN, ED  POC OCCULT BLOOD, ED    EKG EKG Interpretation  Date/Time:  Thursday December 22 2017 06:35:15 EDT Ventricular Rate:  115 PR Interval:    QRS Duration: 81 QT Interval:  317 QTC Calculation: 439 R Axis:   63 Text Interpretation:  Sinus tachycardia Multiple ventricular premature complexes Borderline T wave abnormalities Confirmed by Lacretia Leigh ([id number]) on 12/22/2017 2:26:05 PM   Radiology Dg Chest 2 View  Result Date: 12/22/2017 CLINICAL DATA:  Chest pain EXAM: CHEST - 2 VIEW COMPARISON:  12/12/2017 FINDINGS: Low lung volumes. Heart and mediastinal contours are within normal limits. No focal opacities or effusions. No acute bony abnormality. IMPRESSION: Low volumes.  No active cardiopulmonary disease. Electronically Signed   By: Rolm Baptise M.D.   On: 12/22/2017 08:26   Ct Angio Chest Pe W And/or Wo Contrast  Result Date: 12/22/2017 CLINICAL DATA:  Shortness of breath. EXAM: CT ANGIOGRAPHY CHEST WITH CONTRAST TECHNIQUE: Multidetector CT imaging of the chest was performed using the standard protocol during bolus administration of intravenous contrast. Multiplanar CT image reconstructions and MIPs were obtained to evaluate the vascular anatomy. CONTRAST:  159mL ISOVUE-370 IOPAMIDOL (ISOVUE-370) INJECTION 76% COMPARISON:  Is chest x-ray 12/22/2017 FINDINGS: Cardiovascular: Pulmonary arteries are moderately well opacified. There is no evidence for pulmonary embolus to  the segmental level. Smaller pulmonary emboli would be difficult to detect given the artifact in the smaller vessels. Heart size normal. No pericardial effusion. The thoracic aorta is not aneurysmal. No evidence for dissection or significant atherosclerotic calcification. Mediastinum/Nodes: The visualized portion of the thyroid gland has a normal appearance. Esophagus is normal. No mediastinal, hilar, or axillary adenopathy. Lungs/Pleura: Lungs are clear. No pleural effusion or pneumothorax. Upper Abdomen: There is diffuse low-attenuation of the liver consistent with hepatic steatosis. Musculoskeletal: No chest wall abnormality. No acute or significant osseous findings. There is asymmetry chest wall soft tissue, RIGHT greater than LEFT. History provided the electronic medical record is bilateral mastectomy for breast cancer. However, there is significant bilateral chest wall soft tissue raising the question of gynecomastia versus recurrence of breast cancer. Review of the MIP images confirms the above findings. IMPRESSION: 1. The study is adequate to exclude larger pulmonary emboli but is inadequate to exclude smaller pulmonary emboli. No pulmonary infarcts or suspicious pulmonary nodules. No infiltrates or effusions. 2. Hepatic steatosis. 3. Significant anterior chest wall soft tissue, RIGHT greater than LEFT. Recommend physical exam for correlation. Consider bilateral diagnostic mammogram to exclude malignancy. Electronically Signed   By: Nolon Nations M.D.   On: 12/22/2017 13:54    Procedures Procedures (including critical care time)  Medications Ordered in ED Medications  aspirin chewable tablet 324 mg (324 mg Oral Given 12/22/17 0746)  iopamidol (ISOVUE-370) 76 % injection 100 mL (100 mLs Intravenous Contrast Given 12/22/17 1340)  iopamidol (ISOVUE-370) 76 % injection 100 mL (100 mLs Intravenous Contrast Given 12/22/17 0904)  LORazepam (ATIVAN) injection 1 mg (1 mg Intravenous Given 12/22/17 0929)    diphenhydrAMINE (BENADRYL) injection 25 mg (25 mg Intravenous Given 12/22/17 1121)  fentaNYL (SUBLIMAZE) injection 50 mcg (50 mcg Intravenous Given 12/22/17 1121)     Initial Impression / Assessment and Plan / ED Course  I have reviewed the triage vital signs and the nursing notes.  Pertinent labs & imaging results that were available during my care of the patient were reviewed by me and considered  in my medical decision making (see chart for details).     37 year old male who presents for evaluation of chest pain that began 2 hours prior to ED arrival.  Associated with shortness of breath.  Worse with deep inspiration and with exertion.  Reports he has a history of high heart rate but denies any other personal cardiac history.  No family cardiac history.  Does use cocaine frequently. On exam, lungs clear to auscultation bilaterally.  Distal pulses equal in all 4 extremities.  Consider ACS etiology versus infectious etiology versus electrolyte imbalance.  Also consider PE given recent hospitalizations and immobilization.  History/physical exam is not concerning for dissection.  Plan to check chest x-ray, EKG, labs.  Given concern for PE, will plan for CTA of chest.  Patient states that he feels like his throat is irritating.  He states that it hurts to swallow. I had patient p.o. challenge while he was in the room.  He was able to swallow water without any difficulty.  Airway is patent, phonation is intact.  No evidence of anaphylaxis.  CBC shows white blood cell count of 2.5. Review of records show that he has previously had leukopenia.  Hemoglobin is 10.4 and hematocrit is 31.1. This is a 2 point drop in hemoglobin in the last week. CMP shows potassium of 3.3.  AST is elevated at 7.4, ALT is 79.  Ethanol level slightly elevated at 87.  Chest x-ray negative for any acute infectious etiology. Trop negative. EKG shows sinus tachycardia. There is occasional PVCs. Review of  Previous EKGs show prior PVCs.    Discussed results with patient. He reports he has not had any blood in stools. Will fecal occult. He reports improvement in pain. He reports irritation in throat is improved. He is tolerating PO without any difficulty.   Given patient's risk factors and presentation, he has a HEART score of 3. Will plan to delta trop.   CTA of chest can exclude larger PE but inadequate for smaller. No suspicious infarcts or nodules. AT this time, patient is pain free. He is not tachycardic or hypoxic. Additionally, there is a small area of Right > left anterior chest wall asymmetry. Question gynecomastia vs breast cancer. Discussed results with patient. He reports that he had previously been transitioning from male to male and had silicone breast injections which then subsequently turned into cancer. He had a bilaterally masectomy. Instructed patient that he will need follow up for evaluation of the findings.   Delta trop negative. Discussed results with patient. Patient reports pain is resolved. He is ambulating in the ED without any difficulty. No difficulty tolerating PO. Will plan to give outpatient referral for followup regarding chset CTA findings. Patient had ample opportunity for questions and discussion. All patient's questions were answered with full understanding. Strict return precautions discussed. Patient expresses understanding and agreement to plan.    Final Clinical Impressions(s) / ED Diagnoses   Final diagnoses:  Chest pain, unspecified type    ED Discharge Orders    None       Desma Mcgregor 12/23/17 2304    Lacretia Leigh, MD 12/24/17 1151

## 2017-12-28 ENCOUNTER — Emergency Department (HOSPITAL_COMMUNITY)
Admission: EM | Admit: 2017-12-28 | Discharge: 2017-12-28 | Disposition: A | Discharge status: Home / Self Care | Payer: Self-pay | Attending: Emergency Medicine | Admitting: Emergency Medicine

## 2017-12-28 DIAGNOSIS — Z5321 Procedure and treatment not carried out due to patient leaving prior to being seen by health care provider: Secondary | ICD-10-CM | POA: Insufficient documentation

## 2017-12-28 DIAGNOSIS — R569 Unspecified convulsions: Secondary | ICD-10-CM | POA: Insufficient documentation

## 2017-12-28 NOTE — ED Provider Notes (Signed)
Informed that the patient left with his mother without being seen.   Merrily Pew, MD 12/28/17 1800

## 2017-12-28 NOTE — ED Triage Notes (Signed)
EMS reports from home called by roommate during second seizure, EMS reports no post dictal phase noted Pt A&O x 4 with full conversation. Pt admits to having two bottles of wine today. DC from [facility] last week.  BP 132/93 HR 100 Resp 18 Sp02 98 RA  CBG 87

## 2017-12-28 NOTE — ED Notes (Addendum)
Pt was seen by writer leaving out the front door, thru triage for the second time. Pt sts "yall want to keep me for having two seizures, nah Im out"  RN notified.

## 2017-12-28 NOTE — ED Notes (Signed)
Bed: [facility] Expected date:  Expected time:  Means of arrival:  Comments: EMS seizure ETOH use

## 2017-12-28 NOTE — ED Notes (Signed)
Pt was on the bed in the hallway and asked about a bus pass. This Probation officer stated that the pt needed to be seen and discharged to receive a bus pass. The pt stated that he was not staying. This Probation officer went to inform the doctor and the pt got out of bed and left. He then came back and stated that EMS has his keys and he needs to talk to them. He then started shaking and the secretary lowered him to the floor. Pt started spitting out of his mouth. His arm was picked up and when it was let go he lowered it to the floor, it did not fall to the ground. His eyes would track you when opened. There was no postictal state after the 2 minute event. Pt immediately stated he wanted to leave. He was told why it was important to stay and be evaluated and he refused. Pt was ambulatory upon leaving, CAOx4.

## 2017-12-29 ENCOUNTER — Emergency Department (HOSPITAL_COMMUNITY)
Admission: EM | Admit: 2017-12-29 | Discharge: 2017-12-30 | Disposition: A | Discharge status: Home / Self Care | Payer: Self-pay | Attending: Emergency Medicine | Admitting: Emergency Medicine

## 2017-12-29 ENCOUNTER — Other Ambulatory Visit: Payer: Self-pay

## 2017-12-29 DIAGNOSIS — F10921 Alcohol use, unspecified with intoxication delirium: Secondary | ICD-10-CM | POA: Insufficient documentation

## 2017-12-29 DIAGNOSIS — I739 Peripheral vascular disease, unspecified: Secondary | ICD-10-CM | POA: Insufficient documentation

## 2017-12-29 DIAGNOSIS — Z87891 Personal history of nicotine dependence: Secondary | ICD-10-CM | POA: Insufficient documentation

## 2017-12-29 DIAGNOSIS — Y908 Blood alcohol level of 240 mg/100 ml or more: Secondary | ICD-10-CM | POA: Insufficient documentation

## 2017-12-29 DIAGNOSIS — Z85528 Personal history of other malignant neoplasm of kidney: Secondary | ICD-10-CM | POA: Insufficient documentation

## 2017-12-29 DIAGNOSIS — I10 Essential (primary) hypertension: Secondary | ICD-10-CM | POA: Insufficient documentation

## 2017-12-29 DIAGNOSIS — Z853 Personal history of malignant neoplasm of breast: Secondary | ICD-10-CM | POA: Insufficient documentation

## 2017-12-29 DIAGNOSIS — F250 Schizoaffective disorder, bipolar type: Secondary | ICD-10-CM | POA: Insufficient documentation

## 2017-12-29 DIAGNOSIS — Z79899 Other long term (current) drug therapy: Secondary | ICD-10-CM | POA: Insufficient documentation

## 2017-12-29 DIAGNOSIS — F1090 Alcohol use, unspecified, uncomplicated: Secondary | ICD-10-CM | POA: Diagnosis present

## 2017-12-29 DIAGNOSIS — F1010 Alcohol abuse, uncomplicated: Secondary | ICD-10-CM | POA: Diagnosis present

## 2017-12-29 DIAGNOSIS — R45851 Suicidal ideations: Secondary | ICD-10-CM | POA: Insufficient documentation

## 2017-12-29 DIAGNOSIS — I2510 Atherosclerotic heart disease of native coronary artery without angina pectoris: Secondary | ICD-10-CM | POA: Insufficient documentation

## 2017-12-29 DIAGNOSIS — F1410 Cocaine abuse, uncomplicated: Secondary | ICD-10-CM | POA: Insufficient documentation

## 2017-12-29 MED ORDER — M.V.I. ADULT IV INJ
INJECTION | Freq: Once | INTRAVENOUS | Status: AC
  Administered 2017-12-30: via INTRAVENOUS
  Filled 2017-12-29: qty 1000

## 2017-12-29 NOTE — ED Notes (Signed)
Bed: [facility] Expected date:  Expected time:  Means of arrival:  Comments: EMS Overdose

## 2017-12-29 NOTE — ED Provider Notes (Signed)
[facility] DEPT Provider Note: Georgena Spurling, MD, FACEP  CSN: [id number] MRN: [id number] ARRIVAL: 12/29/17 at 2317 ROOM: RESA/RESA   CHIEF COMPLAINT  Alcohol Intoxication and Suicidal  Level 5 caveat: Intoxicated HISTORY OF PRESENT ILLNESS  12/29/17 11:30 PM [name] is a 37 y.o. male with a history of psychiatric illness and polysubstance abuse.  He was brought by EMS after reportedly stating he wanted to kill himself by drug overdose.  He admitted to using cocaine 20 minutes before EMS was called.  He also admits to drinking alcohol.  He is profoundly intoxicated and cannot give a coherent history.   Past Medical History:  Diagnosis Date  . Angina   . Anxiety    panic attack  . Bipolar 1 disorder ([place])   . Breast CA ([place]) dx'd 2009   bil w/ bil masectomy and oral meds  . Cancer [facility])    kidney cancer  . Coronary artery disease   . Depression   . H/O suicide attempt 2015   overdose  . Headache(784.0)   . Hypercholesteremia   . Hypertension   . Liver cirrhosis ([place])   . Pancreatitis   . Peripheral vascular disease [facility]) April 2011   Left Pop  . Schizophrenia ([place])   . Seizures ([place])    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  . Shortness of breath     Past Surgical History:  Procedure Laterality Date  . BREAST SURGERY    . BREAST SURGERY     bilateral breast silocone  removal  . CHEST SURGERY    . left kidney removal    . left leg surgery     "popiteal artery clogged"  . MASTECTOMY Bilateral   . NEPHRECTOMY Left   . ORIF CLAVICULAR FRACTURE Left 08/10/2017   Procedure: OPEN REDUCTION INTERNAL FIXATION (ORIF) LEFT CLAVICLE FRACTURE WITH RECONSTRUCTION OF CORACOCLAVICULAR LIGAMENT;  Surgeon: Leandrew Koyanagi, MD;  Location: [facility];  Service: Orthopedics;  Laterality: Left;  . RECONSTRUCTION OF CORACOCLAVICULAR LIGAMENT Left 08/10/2017   Procedure: RECONSTRUCTION OF CORACOCLAVICULAR LIGAMENT;  Surgeon: Leandrew Koyanagi, MD;  Location: [facility];  Service: Orthopedics;   Laterality: Left;    Family History  Problem Relation Age of Onset  . Stroke Other   . Cancer Other   . Hyperlipidemia Mother   . Hypertension Mother     Social History   Tobacco Use  . Smoking status: Former Research scientist (life sciences)  . Smokeless tobacco: Never Used  . Tobacco comment: smoked on and off 16 years- quit 2016  Substance Use Topics  . Alcohol use: Yes  . Drug use: No    Frequency: 1.0 times per week    Types: "Crack" cocaine, Cocaine    Prior to Admission medications   Medication Sig Start Date End Date Taking? Authorizing Provider  amLODipine (NORVASC) 5 MG tablet Take 1 tablet (5 mg total) by mouth daily. 12/15/17  Yes Eugenie Filler, MD  busPIRone (BUSPAR) 10 MG tablet Take 1 tablet (10 mg total) by mouth 3 (three) times daily. 12/15/17  Yes Eugenie Filler, MD  clonazePAM (KLONOPIN) 1 MG tablet Take 1 mg by mouth at bedtime as needed for anxiety.   Yes [provider]  Ibuprofen 200 MG CAPS Take 400 mg by mouth daily as needed (pain).   Yes [provider]  QUEtiapine (SEROQUEL) 100 MG tablet Take 1 tablet (100 mg total) by mouth at bedtime. 12/15/17  Yes Eugenie Filler, MD  ziprasidone (GEODON) 20 MG capsule Take  20 mg by mouth daily.   Yes [provider]    Allergies Codeine; Penicillins; Morphine; Coconut flavor; Coconut oil; Grapefruit concentrate; Morphine and related; Oxycodone; and Norco [hydrocodone-acetaminophen]   REVIEW OF SYSTEMS     PHYSICAL EXAMINATION  Initial Vital Signs Blood pressure (!) 139/105, pulse 97, temperature 98.1 F (36.7 C), temperature source Oral, resp. rate 16, height 5\' 6"  (1.676 m), weight 68 kg (150 lb), SpO2 94 %.  Examination General: Well-developed, well-nourished male in no acute distress; appearance consistent with age of record HENT: normocephalic; atraumatic; breath smells of alcohol Eyes: pupils equal, round and reactive to light Neck: supple Heart: regular rate and rhythm Lungs: clear  to auscultation bilaterally Abdomen: soft; nondistended; nontender; right upper quadrant tenderness; bowel sounds present Extremities: No deformity; full range of motion; pulses normal Neurologic: Somnolent but arousable; profoundly dysarthric; ataxic; motor function intact in all extremities and symmetric; no facial droop Skin: Warm and dry Psychiatric: Intoxicated   RESULTS  Summary of this visit's results, reviewed by myself:   EKG Interpretation  Date/Time:    Ventricular Rate:    PR Interval:    QRS Duration:   QT Interval:    QTC Calculation:   R Axis:     Text Interpretation:        Laboratory Studies: Results for orders placed or performed during the hospital encounter of 12/29/17 (from the past 24 hour(s))  Comprehensive metabolic panel     Status: Abnormal   Collection Time: 12/30/17 12:09 AM  Result Value Ref Range   Sodium 146 (H) 135 - 145 mmol/L   Potassium 4.0 3.5 - 5.1 mmol/L   Chloride 107 101 - 111 mmol/L   CO2 28 22 - 32 mmol/L   Glucose, Bld 89 65 - 99 mg/dL   BUN 11 6 - 20 mg/dL   Creatinine, Ser 0.78 0.61 - 1.24 mg/dL   Calcium 8.3 (L) 8.9 - 10.3 mg/dL   Total Protein 7.2 6.5 - 8.1 g/dL   Albumin 3.7 3.5 - 5.0 g/dL   AST 87 (H) 15 - 41 U/L   ALT 65 (H) 17 - 63 U/L   Alkaline Phosphatase 71 38 - 126 U/L   Total Bilirubin 0.3 0.3 - 1.2 mg/dL   GFR calc non Af Amer >60 >60 mL/min   GFR calc Af Amer >60 >60 mL/min   Anion gap 11 5 - 15  Ethanol     Status: Abnormal   Collection Time: 12/30/17 12:09 AM  Result Value Ref Range   Alcohol, Ethyl (B) 441 (HH) <97 mg/dL  Salicylate level     Status: None   Collection Time: 12/30/17 12:09 AM  Result Value Ref Range   Salicylate Lvl <6.7 2.8 - 30.0 mg/dL  Acetaminophen level     Status: Abnormal   Collection Time: 12/30/17 12:09 AM  Result Value Ref Range   Acetaminophen (Tylenol), Serum <10 (L) 10 - 30 ug/mL  CBC with Differential/Platelet     Status: Abnormal   Collection Time: 12/30/17 12:09 AM    Result Value Ref Range   WBC 3.9 (L) 4.0 - 10.5 K/uL   RBC 4.48 4.22 - 5.81 MIL/uL   Hemoglobin 13.7 13.0 - 17.0 g/dL   HCT 40.8 39.0 - 52.0 %   MCV 91.1 78.0 - 100.0 fL   MCH 30.6 26.0 - 34.0 pg   MCHC 33.6 30.0 - 36.0 g/dL   RDW 16.9 (H) 11.5 - 15.5 %   Platelets 157 150 - 400  K/uL   Neutrophils Relative % 36 %   Neutro Abs 1.4 (L) 1.7 - 7.7 K/uL   Lymphocytes Relative 54 %   Lymphs Abs 2.1 0.7 - 4.0 K/uL   Monocytes Relative 8 %   Monocytes Absolute 0.3 0.1 - 1.0 K/uL   Eosinophils Relative 1 %   Eosinophils Absolute 0.1 0.0 - 0.7 K/uL   Basophils Relative 1 %   Basophils Absolute 0.0 0.0 - 0.1 K/uL  Rapid urine drug screen (hospital performed)     Status: Abnormal   Collection Time: 12/30/17  1:01 AM  Result Value Ref Range   Opiates NONE DETECTED NONE DETECTED   Cocaine NONE DETECTED NONE DETECTED   Benzodiazepines NONE DETECTED NONE DETECTED   Amphetamines NONE DETECTED NONE DETECTED   Tetrahydrocannabinol NONE DETECTED NONE DETECTED   Barbiturates (A) NONE DETECTED    Result not available. Reagent lot number recalled by manufacturer.   Imaging Studies: No results found.  ED COURSE and MDM  Nursing notes and initial vitals signs, including pulse oximetry, reviewed.  Vitals:   12/30/17 0230 12/30/17 0300 12/30/17 0301 12/30/17 0330  BP: (!) 126/98 121/84 121/84 119/82  Pulse: 91 90 91 85  Resp: 11 18 18 13   Temp:   97.9 F (36.6 C)   TempSrc:   Oral   SpO2: 91% 93% 93% 94%  Weight:      Height:       11:38 PM Patient on cardiac monitor and pulse oximetry.  Banana bag ordered.   3:51 AM Will obtain TTS consult when patient is sober.  PROCEDURES    ED DIAGNOSES     ICD-10-CM   1. Alcohol intoxication with delirium ([place]) F10.921   2. Suicidal ideation R45.851        Shanon Rosser, MD 12/30/17 [phone]

## 2017-12-29 NOTE — ED Triage Notes (Signed)
Pt to ED via [facility] with c/o of using Cocaine 20 minutes before [facility] arrived and then stated to them "he wanted to kill himself via overdose"  :

## 2017-12-30 ENCOUNTER — Telehealth (HOSPITAL_COMMUNITY): Payer: Self-pay

## 2017-12-30 ENCOUNTER — Emergency Department (HOSPITAL_COMMUNITY): Payer: Self-pay

## 2017-12-30 ENCOUNTER — Inpatient Hospital Stay (HOSPITAL_COMMUNITY)
Admission: EM | Admit: 2017-12-30 | Discharge: 2018-01-03 | DRG: 897 | Disposition: A | Discharge status: Home / Self Care | Payer: Self-pay | Attending: Internal Medicine | Admitting: Internal Medicine

## 2017-12-30 ENCOUNTER — Ambulatory Visit (HOSPITAL_COMMUNITY): Payer: Self-pay

## 2017-12-30 ENCOUNTER — Encounter (HOSPITAL_COMMUNITY): Payer: Self-pay | Admitting: Emergency Medicine

## 2017-12-30 ENCOUNTER — Other Ambulatory Visit: Payer: Self-pay

## 2017-12-30 DIAGNOSIS — R402252 Coma scale, best verbal response, oriented, at arrival to emergency department: Secondary | ICD-10-CM | POA: Diagnosis present

## 2017-12-30 DIAGNOSIS — D696 Thrombocytopenia, unspecified: Secondary | ICD-10-CM | POA: Diagnosis present

## 2017-12-30 DIAGNOSIS — R945 Abnormal results of liver function studies: Secondary | ICD-10-CM

## 2017-12-30 DIAGNOSIS — Z87891 Personal history of nicotine dependence: Secondary | ICD-10-CM

## 2017-12-30 DIAGNOSIS — F209 Schizophrenia, unspecified: Secondary | ICD-10-CM | POA: Diagnosis present

## 2017-12-30 DIAGNOSIS — Z8349 Family history of other endocrine, nutritional and metabolic diseases: Secondary | ICD-10-CM

## 2017-12-30 DIAGNOSIS — I10 Essential (primary) hypertension: Secondary | ICD-10-CM | POA: Diagnosis present

## 2017-12-30 DIAGNOSIS — Z79899 Other long term (current) drug therapy: Secondary | ICD-10-CM

## 2017-12-30 DIAGNOSIS — K2970 Gastritis, unspecified, without bleeding: Secondary | ICD-10-CM | POA: Diagnosis present

## 2017-12-30 DIAGNOSIS — W109XXA Fall (on) (from) unspecified stairs and steps, initial encounter: Secondary | ICD-10-CM | POA: Diagnosis present

## 2017-12-30 DIAGNOSIS — G40509 Epileptic seizures related to external causes, not intractable, without status epilepticus: Secondary | ICD-10-CM | POA: Diagnosis present

## 2017-12-30 DIAGNOSIS — Z880 Allergy status to penicillin: Secondary | ICD-10-CM

## 2017-12-30 DIAGNOSIS — K7030 Alcoholic cirrhosis of liver without ascites: Secondary | ICD-10-CM | POA: Diagnosis present

## 2017-12-30 DIAGNOSIS — R402142 Coma scale, eyes open, spontaneous, at arrival to emergency department: Secondary | ICD-10-CM | POA: Diagnosis present

## 2017-12-30 DIAGNOSIS — Z853 Personal history of malignant neoplasm of breast: Secondary | ICD-10-CM

## 2017-12-30 DIAGNOSIS — Z85528 Personal history of other malignant neoplasm of kidney: Secondary | ICD-10-CM

## 2017-12-30 DIAGNOSIS — Z823 Family history of stroke: Secondary | ICD-10-CM

## 2017-12-30 DIAGNOSIS — Z9013 Acquired absence of bilateral breasts and nipples: Secondary | ICD-10-CM

## 2017-12-30 DIAGNOSIS — Z905 Acquired absence of kidney: Secondary | ICD-10-CM

## 2017-12-30 DIAGNOSIS — F10239 Alcohol dependence with withdrawal, unspecified: Principal | ICD-10-CM | POA: Diagnosis present

## 2017-12-30 DIAGNOSIS — F339 Major depressive disorder, recurrent, unspecified: Secondary | ICD-10-CM | POA: Diagnosis present

## 2017-12-30 DIAGNOSIS — Y92009 Unspecified place in unspecified non-institutional (private) residence as the place of occurrence of the external cause: Secondary | ICD-10-CM

## 2017-12-30 DIAGNOSIS — Z8659 Personal history of other mental and behavioral disorders: Secondary | ICD-10-CM

## 2017-12-30 DIAGNOSIS — R45851 Suicidal ideations: Secondary | ICD-10-CM

## 2017-12-30 DIAGNOSIS — F10288 Alcohol dependence with other alcohol-induced disorder: Secondary | ICD-10-CM | POA: Diagnosis present

## 2017-12-30 DIAGNOSIS — F1090 Alcohol use, unspecified, uncomplicated: Secondary | ICD-10-CM | POA: Diagnosis present

## 2017-12-30 DIAGNOSIS — Z885 Allergy status to narcotic agent status: Secondary | ICD-10-CM

## 2017-12-30 DIAGNOSIS — Z9102 Food additives allergy status: Secondary | ICD-10-CM

## 2017-12-30 DIAGNOSIS — F410 Panic disorder [episodic paroxysmal anxiety] without agoraphobia: Secondary | ICD-10-CM | POA: Diagnosis present

## 2017-12-30 DIAGNOSIS — R109 Unspecified abdominal pain: Secondary | ICD-10-CM | POA: Diagnosis present

## 2017-12-30 DIAGNOSIS — I2510 Atherosclerotic heart disease of native coronary artery without angina pectoris: Secondary | ICD-10-CM | POA: Diagnosis present

## 2017-12-30 DIAGNOSIS — K760 Fatty (change of) liver, not elsewhere classified: Secondary | ICD-10-CM | POA: Diagnosis present

## 2017-12-30 DIAGNOSIS — G40909 Epilepsy, unspecified, not intractable, without status epilepticus: Secondary | ICD-10-CM

## 2017-12-30 DIAGNOSIS — R402362 Coma scale, best motor response, obeys commands, at arrival to emergency department: Secondary | ICD-10-CM | POA: Diagnosis present

## 2017-12-30 DIAGNOSIS — F10939 Alcohol use, unspecified with withdrawal, unspecified: Secondary | ICD-10-CM | POA: Diagnosis present

## 2017-12-30 DIAGNOSIS — R000 Tachycardia, unspecified: Secondary | ICD-10-CM | POA: Diagnosis present

## 2017-12-30 DIAGNOSIS — Z915 Personal history of self-harm: Secondary | ICD-10-CM

## 2017-12-30 DIAGNOSIS — Z8249 Family history of ischemic heart disease and other diseases of the circulatory system: Secondary | ICD-10-CM

## 2017-12-30 DIAGNOSIS — F10230 Alcohol dependence with withdrawal, uncomplicated: Secondary | ICD-10-CM | POA: Diagnosis present

## 2017-12-30 DIAGNOSIS — R569 Unspecified convulsions: Secondary | ICD-10-CM | POA: Diagnosis present

## 2017-12-30 DIAGNOSIS — R7989 Other specified abnormal findings of blood chemistry: Secondary | ICD-10-CM | POA: Diagnosis present

## 2017-12-30 DIAGNOSIS — F1010 Alcohol abuse, uncomplicated: Secondary | ICD-10-CM | POA: Diagnosis present

## 2017-12-30 DIAGNOSIS — Y908 Blood alcohol level of 240 mg/100 ml or more: Secondary | ICD-10-CM | POA: Diagnosis present

## 2017-12-30 DIAGNOSIS — S00511A Abrasion of lip, initial encounter: Secondary | ICD-10-CM | POA: Diagnosis present

## 2017-12-30 LAB — RAPID URINE DRUG SCREEN, HOSP PERFORMED
Amphetamines: NOT DETECTED
Benzodiazepines: NOT DETECTED
Cocaine: NOT DETECTED
Opiates: NOT DETECTED
Tetrahydrocannabinol: NOT DETECTED

## 2017-12-30 LAB — COMPREHENSIVE METABOLIC PANEL
ALK PHOS: 71 U/L (ref 38–126)
ALK PHOS: 71 U/L (ref 38–126)
ALT: 65 U/L — ABNORMAL HIGH (ref 17–63)
ALT: 63 U/L (ref 17–63)
ANION GAP: 13 (ref 5–15)
ANION GAP: 11 (ref 5–15)
AST: 87 U/L — ABNORMAL HIGH (ref 15–41)
AST: 87 U/L — ABNORMAL HIGH (ref 15–41)
Albumin: 3.7 g/dL (ref 3.5–5.0)
Albumin: 3.7 g/dL (ref 3.5–5.0)
BILIRUBIN TOTAL: 0.2 mg/dL — AB (ref 0.3–1.2)
BILIRUBIN TOTAL: 0.3 mg/dL (ref 0.3–1.2)
BUN: 11 mg/dL (ref 6–20)
BUN: 6 mg/dL (ref 6–20)
CALCIUM: 8.6 mg/dL — AB (ref 8.9–10.3)
CALCIUM: 8.3 mg/dL — AB (ref 8.9–10.3)
CO2: 28 mmol/L (ref 22–32)
CO2: 24 mmol/L (ref 22–32)
CREATININE: 0.87 mg/dL (ref 0.61–1.24)
Chloride: 107 mmol/L (ref 101–111)
Chloride: 105 mmol/L (ref 101–111)
Creatinine, Ser: 0.78 mg/dL (ref 0.61–1.24)
GFR calc non Af Amer: >60 mL/min (ref >60)
GFR calc non Af Amer: >60 mL/min (ref >60)
GLUCOSE: 164 mg/dL — AB (ref 65–99)
Glucose, Bld: 89 mg/dL (ref 65–99)
Potassium: 4 mmol/L (ref 3.5–5.1)
Potassium: 3.9 mmol/L (ref 3.5–5.1)
Sodium: 146 mmol/L — ABNORMAL HIGH (ref 135–145)
Sodium: 142 mmol/L (ref 135–145)
TOTAL PROTEIN: 7.1 g/dL (ref 6.5–8.1)
TOTAL PROTEIN: 7.2 g/dL (ref 6.5–8.1)

## 2017-12-30 LAB — CBC
HCT: 42.2 % (ref 39.0–52.0)
Hemoglobin: 13.9 g/dL (ref 13.0–17.0)
MCH: 30 pg (ref 26.0–34.0)
MCHC: 32.9 g/dL (ref 30.0–36.0)
MCV: 90.9 fL (ref 78.0–100.0)
PLATELETS: 169 10*3/uL (ref 150–400)
RBC: 4.64 MIL/uL (ref 4.22–5.81)
RDW: 16.3 % — AB (ref 11.5–15.5)
WBC: 3.3 10*3/uL — ABNORMAL LOW (ref 4.0–10.5)

## 2017-12-30 LAB — CBC WITH DIFFERENTIAL/PLATELET
Basophils Absolute: 0 10*3/uL (ref 0.0–0.1)
Basophils Relative: 1 %
EOS ABS: 0.1 10*3/uL (ref 0.0–0.7)
Eosinophils Relative: 1 %
HCT: 40.8 % (ref 39.0–52.0)
HEMOGLOBIN: 13.7 g/dL (ref 13.0–17.0)
LYMPHS ABS: 2.1 10*3/uL (ref 0.7–4.0)
LYMPHS PCT: 54 %
MCH: 30.6 pg (ref 26.0–34.0)
MCHC: 33.6 g/dL (ref 30.0–36.0)
MCV: 91.1 fL (ref 78.0–100.0)
Monocytes Absolute: 0.3 10*3/uL (ref 0.1–1.0)
Monocytes Relative: 8 %
NEUTROS ABS: 1.4 10*3/uL — AB (ref 1.7–7.7)
Neutrophils Relative %: 36 %
Platelets: 157 10*3/uL (ref 150–400)
RBC: 4.48 MIL/uL (ref 4.22–5.81)
RDW: 16.9 % — ABNORMAL HIGH (ref 11.5–15.5)
WBC: 3.9 10*3/uL — AB (ref 4.0–10.5)

## 2017-12-30 LAB — ACETAMINOPHEN LEVEL

## 2017-12-30 LAB — SALICYLATE LEVEL

## 2017-12-30 LAB — ETHANOL
ALCOHOL ETHYL (B): 441 mg/dL — AB (ref <10)
Alcohol, Ethyl (B): 338 mg/dL (ref <10)

## 2017-12-30 LAB — TROPONIN I

## 2017-12-30 MED ORDER — LORAZEPAM 2 MG/ML IJ SOLN: 0.0000 mg | Freq: Two times a day (BID) | INTRAMUSCULAR | Status: DC

## 2017-12-30 MED ORDER — QUETIAPINE FUMARATE 100 MG PO TABS: 100.0000 mg | ORAL_TABLET | Freq: Every day | ORAL | Status: DC

## 2017-12-30 MED ORDER — ONDANSETRON HCL 4 MG PO TABS
4.0000 mg | ORAL_TABLET | Freq: Three times a day (TID) | ORAL | Status: DC | PRN
  Administered 2017-12-30: 4 mg via ORAL
  Filled 2017-12-30: qty 1

## 2017-12-30 MED ORDER — LORAZEPAM 1 MG PO TABS: 0.0000 mg | ORAL_TABLET | Freq: Two times a day (BID) | ORAL | Status: DC

## 2017-12-30 MED ORDER — AMLODIPINE BESYLATE 5 MG PO TABS
5.0000 mg | ORAL_TABLET | Freq: Every day | ORAL | Status: DC
  Administered 2017-12-30: 5 mg via ORAL
  Filled 2017-12-30: qty 1

## 2017-12-30 MED ORDER — LORAZEPAM 2 MG/ML IJ SOLN: 0.0000 mg | Freq: Four times a day (QID) | INTRAMUSCULAR | Status: DC

## 2017-12-30 MED ORDER — LORAZEPAM 1 MG PO TABS: 0.0000 mg | ORAL_TABLET | Freq: Four times a day (QID) | ORAL | Status: DC

## 2017-12-30 MED ORDER — ZIPRASIDONE HCL 20 MG PO CAPS
20.0000 mg | ORAL_CAPSULE | Freq: Every day | ORAL | Status: DC
  Administered 2017-12-30: 20 mg via ORAL
  Filled 2017-12-30: qty 1

## 2017-12-30 MED ORDER — LORAZEPAM 1 MG PO TABS: 1.0000 mg | ORAL_TABLET | Freq: Four times a day (QID) | ORAL | Status: DC | PRN

## 2017-12-30 MED ORDER — THIAMINE HCL 100 MG/ML IJ SOLN
100.0000 mg | Freq: Every day | INTRAMUSCULAR | Status: DC
  Filled 2017-12-30: qty 2

## 2017-12-30 MED ORDER — LORAZEPAM 2 MG/ML IJ SOLN
2.0000 mg | Freq: Once | INTRAMUSCULAR | Status: AC
  Administered 2017-12-30: 2 mg via INTRAVENOUS
  Filled 2017-12-30: qty 1

## 2017-12-30 MED ORDER — ADULT MULTIVITAMIN W/MINERALS CH
1.0000 | ORAL_TABLET | Freq: Every day | ORAL | Status: DC
  Administered 2017-12-30: 1 via ORAL
  Filled 2017-12-30: qty 1

## 2017-12-30 MED ORDER — THIAMINE HCL 100 MG/ML IJ SOLN
100.0000 mg | Freq: Every day | INTRAMUSCULAR | Status: DC
  Administered 2017-12-30: 100 mg via INTRAVENOUS

## 2017-12-30 MED ORDER — LORAZEPAM 2 MG/ML IJ SOLN: 1.0000 mg | Freq: Four times a day (QID) | INTRAMUSCULAR | Status: DC | PRN

## 2017-12-30 MED ORDER — LORAZEPAM 1 MG PO TABS: 1.0000 mg | ORAL_TABLET | Freq: Once | ORAL | Status: DC

## 2017-12-30 MED ORDER — FOLIC ACID 1 MG PO TABS
1.0000 mg | ORAL_TABLET | Freq: Every day | ORAL | Status: DC
  Administered 2017-12-30: 1 mg via ORAL
  Filled 2017-12-30: qty 1

## 2017-12-30 MED ORDER — VITAMIN B-1 100 MG PO TABS: 100.0000 mg | ORAL_TABLET | Freq: Every day | ORAL | Status: DC

## 2017-12-30 MED ORDER — SODIUM CHLORIDE 0.9 % IV SOLN
INTRAVENOUS | Status: DC
  Administered 2017-12-30: 18:00:00 via INTRAVENOUS

## 2017-12-30 MED ORDER — SODIUM CHLORIDE 0.9 % IV BOLUS
1000.0000 mL | Freq: Once | INTRAVENOUS | Status: AC
  Administered 2017-12-30: 1000 mL via INTRAVENOUS

## 2017-12-30 MED ORDER — ALUM & MAG HYDROXIDE-SIMETH 200-200-20 MG/5ML PO SUSP: 30.0000 mL | Freq: Four times a day (QID) | ORAL | Status: DC | PRN

## 2017-12-30 NOTE — [facility] Suicide Risk Assessment (Cosign Needed)
Suicide Risk Assessment  Discharge Assessment   [facility] Discharge Suicide Risk Assessment   Principal Problem: Alcohol abuse Discharge Diagnoses:  Patient Active Problem List   Diagnosis Date Noted  . Alcohol withdrawal seizure without complication ([place]) [N98.921]   . Melena [K92.1] 12/12/2017  . Emesis [R11.10] 10/20/2017  . Displaced fracture of lateral end of left clavicle, initial encounter for closed fracture [S42.032A] 08/05/2017  . Coracoclavicular (ligament) sprain and strain, left, initial encounter [S43.82XA] 08/05/2017  . Closed dislocation of acromioclavicular joint, initial encounter [J94.174Y] 08/05/2017  . Insomnia [G47.00]   . Hypertension [I10]   . Severe episode of recurrent major depressive disorder, without psychotic features ([place]) [F33.2]   . Depression, major, severe recurrence ([place]) [F33.2] 12/30/2015  . Substance induced mood disorder ([place]) [F19.94] 12/02/2015  . Mood disorder in conditions classified elsewhere [F06.30]   . Malnutrition of moderate degree [E44.0] 09/24/2015  . Alcohol withdrawal ([place]) [F10.239] 09/23/2015  . Tobacco use disorder [F17.200] 07/16/2015  . Elevated transaminase level [R74.0]   . Drug overdose, intentional ([place]) [T50.902A] 07/12/2015  . Alcohol use disorder, severe, dependence ([place]) [F10.20] 04/15/2015  . Cocaine abuse with cocaine-induced mood disorder ([place]) [F14.14] 04/11/2015  . Overdose [T50.901A] 04/10/2015  . 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 Severe major depression with psychotic features ([place]) [F32.3] 09/11/2014  . Alcohol-induced mood disorder ([place]) [F10.94] 09/10/2014  . Suicidal ideation [R45.851]   . Tylenol overdose [T39.1X1A]   . Polysubstance abuse ([place]) [F19.10]   . Overdose of acetaminophen [T39.1X1A] 08/03/2014  . Overdose by acetaminophen [T39.1X1A] 08/03/2014  . Cocaine abuse ([place]) [F14.10] 04/16/2014  . Thrombocytopenia ([place]) [D69.6] 04/15/2014  . Urinary  tract infection, site not specified [N39.0] 04/15/2014  . Chest pain, unspecified [R07.9] 04/15/2014  . D-dimer, elevated [R79.89] 04/15/2014  . Transaminitis [R74.0] 09/24/2013  . Cocaine dependence ([place]) [F14.20] 09/20/2013  . 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 Seizure ([place]) [R56.9] 03/15/2013  . Syncope [R55] 01/02/2013  . Leukocytopenia, unspecified [D72.819] 01/02/2013  . Left kidney mass [N28.89] 12/24/2012  . PTSD (post-traumatic stress disorder) [F43.10] 07/06/2012  . Episode of recurrent major depressive disorder ([place]) [F33.9] 06/29/2012    Class: Acute  . Peripheral vascular disease ([place]) [I73.9] 01/14/2012  . Alcohol abuse [F10.10] 10/13/2011  . SEIZURE DISORDER [R56.9] 10/03/2008  . Elevated LFTs [R94.5] 12/29/2007  . HYPERCHOLESTEROLEMIA [E78.00] 03/21/2007  . Essential hypertension [I10] 03/21/2007   Pt was seen and chart reviewed with treatment team and Dr Mariea Clonts.  Pt denies suicidal/homicidal ideation, denies auditory/visual hallucinations and does not appear to be responding to internal stimuli. Pt stated he came to the hospital because he had a seizure yesterday. Pt stated he feels better today and had been drinking yesterday, BAL 441, UDS negative. Pt lives with roommates and can contract for safety upon discharge. Pt is stable and psychiatrically clear for discharge.   Total Time spent with patient: 30 minutes  Musculoskeletal: Strength & Muscle Tone: within normal limits Gait & Station: normal Patient leans: N/A  Psychiatric Specialty Exam:   Blood pressure 119/82, pulse 85, temperature 97.9 F (36.6 C), temperature source Oral, resp. rate 13, height 5\' 6"  (1.676 m), weight 150 lb (68 kg), SpO2 94 %.Body mass index is 24.21 kg/m.  General Appearance: Casual  Eye Contact::  Good  Speech:  Clear and [id number]  Volume:  Normal  Mood:  Anxious and Depressed  Affect:  Congruent and Depressed  Thought Process:  Coherent, Goal Directed and Linear   Orientation:  Full (Time, Place, and Person)  Thought Content:  Logical  Suicidal Thoughts:  No  Homicidal Thoughts:  No  Memory:  Immediate;   Good Recent;   Good Remote;   Fair  Judgement:  Good  Insight:  Good  Psychomotor Activity:  Normal  Concentration:  Good  Recall:  Good  Fund of Knowledge:Good  Language: Good  Akathisia:  No  Handed:  Right  AIMS (if indicated):     Assets:  Agricultural consultant Housing Social Support  Sleep:     Cognition: WNL  ADL's:  Intact   Mental Status Per Nursing Assessment::   On Admission:   Suicidal ideation while intoxicated  Demographic Factors:  Male, Low socioeconomic status and Unemployed  Loss Factors: Financial problems/change in socioeconomic status  Historical Factors: Impulsivity  Risk Reduction Factors:   Sense of responsibility to family and Living with another person, especially a relative  Continued Clinical Symptoms:  Depression:   Comorbid alcohol abuse/dependence Impulsivity Alcohol/Substance Abuse/Dependencies  Cognitive Features That Contribute To Risk:  Closed-mindedness    Suicide Risk:  Minimal: No identifiable suicidal ideation.  Patients presenting with no risk factors but with morbid ruminations; may be classified as minimal risk based on the severity of the depressive symptoms    Plan Of Care/Follow-up recommendations:  Activity:  as tolerated Diet:  Heart healthy  Ethelene Hal, NP 12/30/2017, 11:02 AM

## 2017-12-30 NOTE — ED Notes (Signed)
Pt ambulated to BR and tolerated well. Pt given sandwich and drink.

## 2017-12-30 NOTE — ED Provider Notes (Signed)
Patient placed in Quick Look pathway, seen and evaluated   Chief Complaint: Seizure  HPI:   Patient presents with complaint of a seizure of unknown duration that occurred at approximately 1:30 PM today.  States he believes this to be alcohol withdrawal seizure.  Typically drinks a gallon of vodka over the course of 2 days.  Had alcoholic drinks at about 94:17 PM and 4 PM today.  States he injured the right side of his face and lip during the seizure.  Also complains of new neck pain.  Endorses vomiting, tremors, subjective fever, and chest pain for the last 2 days.  Denies incontinence, diarrhea, shortness of breath, abdominal pain, headache, vision loss, unilateral weakness or numbness.   ROS: Seizure (one)  Physical Exam:   Gen: No distress  Neuro: Awake and Alert  Skin: Warm    Focused Exam:  No diaphoresis.  No pallor.  HEENT: Swelling and tenderness in the region of the right mandible.  No noted deformity or crepitus.  No trismus.  Mouth opening to at least 3 finger widths.  Dentition appears to be intact.  Patient agrees.  Superficial appearing abrasion to the lower lip.  No noted lingual trauma.  Pulmonary: No increased work of breathing.  Speaks in full sentences without difficulty.  Lung sounds clear.  No tachypnea.  Cardiac: Tachycardic.  Peripheral pulses intact.  Abdominal: Patient endorses generalized tenderness throughout the chest and abdomen.  No peritoneal signs.  No rebound tenderness.  No guarding.  No CVA tenderness.  Neurologic:  Sensation appears to be mostly intact in the extremities. Strength 5/5 in all extremities. Cranial nerves III-XII grossly intact. No facial droop.  Intention tremor in the bilateral upper extremities.  MSK: No peripheral edema.  No noted midline tenderness to the spine, though patient endorses tenderness to the flanking musculature.   Initiation of care has begun. The patient has been counseled on the process, plan, and necessity for  staying for the completion/evaluation, and the remainder of the medical screening examination   Layla Maw 12/30/17 1640    Lacretia Leigh, MD 01/01/18 [phone]

## 2017-12-30 NOTE — ED Notes (Signed)
Presents with SI, plan to take OD on pills.  Feeling hopeless.  Denies HI or AVH.  Pt intoxicated.  A&O x 3, no distress noted, calm & cooperative at present.  Monitoring for safety, Q 15 min checks in effect.

## 2017-12-30 NOTE — ED Provider Notes (Signed)
[facility] EMERGENCY DEPARTMENT Provider Note   CSN: [id number] Arrival date & time: 12/30/17  1558     History   Chief Complaint Chief Complaint  Patient presents with  . Seizures  . Alcohol Intoxication    HPI Reeves Musick is a 37 y.o. male.  37 year old male with history of alcohol addiction as well as Polar disorder presents here after having a seizure witnessed by his mother.  Was seen at [facility] for suicidal ideations and discharged earlier this morning.  Patient states he drinks about a half a gallon of alcohol a day and his last drink was this morning.  Does have well-documented history of alcohol withdrawal seizures most recent admission approximately 2 weeks ago for similar symptoms.  Did not receive inpatient help for his alcohol abuse.  Today was at home and fell down the steps and is sustaining a superficial injury to his lip.  Denies any neck discomfort at this time.  No chest or lower extremity pain.  No treatment used prior to arrival.     Past Medical History:  Diagnosis Date  . Angina   . Anxiety    panic attack  . Bipolar 1 disorder ([place])   . Breast CA ([place]) dx'd 2009   bil w/ bil masectomy and oral meds  . Cancer [facility])    kidney cancer  . Coronary artery disease   . Depression   . H/O suicide attempt 2015   overdose  . Headache(784.0)   . Hypercholesteremia   . Hypertension   . Liver cirrhosis ([place])   . Pancreatitis   . Peripheral vascular disease [facility]) April 2011   Left Pop  . Schizophrenia ([place])   . Seizures ([place])    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  . Shortness of breath     Patient Active Problem List   Diagnosis Date Noted  . Alcohol withdrawal seizure without complication ([place])   . Melena 12/12/2017  . Emesis 10/20/2017  . Displaced fracture of lateral end of left clavicle, initial encounter for closed fracture 08/05/2017  . Coracoclavicular (ligament) sprain and strain, left, initial encounter  08/05/2017  . Closed dislocation of acromioclavicular joint, initial encounter 08/05/2017  . Insomnia   . Hypertension   . Severe episode of recurrent major depressive disorder, without psychotic features ([place])   . Depression, major, severe recurrence ([place]) 12/30/2015  . Substance induced mood disorder ([place]) 12/02/2015  . Mood disorder in conditions classified elsewhere   . Malnutrition of moderate degree 09/24/2015  . Alcohol withdrawal ([place]) 09/23/2015  . Tobacco use disorder 07/16/2015  . Elevated transaminase level   . Drug overdose, intentional ([place]) 07/12/2015  . Alcohol use disorder, severe, dependence ([place]) 04/15/2015  . Cocaine abuse with cocaine-induced mood disorder ([place]) 04/11/2015  . Overdose 04/10/2015  . Severe recurrent major depressive disorder with psychotic features ([place])   . Severe major depression with psychotic features ([place]) 09/11/2014  . Alcohol-induced mood disorder ([place]) 09/10/2014  . Suicidal ideation   . Tylenol overdose   . Polysubstance abuse ([place])   . Overdose of acetaminophen 08/03/2014  . Overdose by acetaminophen 08/03/2014  . Cocaine abuse ([place]) 04/16/2014  . Thrombocytopenia ([place]) 04/15/2014  . Urinary tract infection, site not specified 04/15/2014  . Chest pain, unspecified 04/15/2014  . D-dimer, elevated 04/15/2014  . Transaminitis 09/24/2013  . Cocaine dependence ([place]) 09/20/2013  . S/p nephrectomy 04/28/2013  . Seizure ([place]) 03/15/2013  . Syncope 01/02/2013  . Leukocytopenia, unspecified 01/02/2013  . Left  kidney mass 12/24/2012  . PTSD (post-traumatic stress disorder) 07/06/2012  . Episode of recurrent major depressive disorder ([place]) 06/29/2012    Class: Acute  . Peripheral vascular disease ([place]) 01/14/2012  . Alcohol abuse 10/13/2011  . SEIZURE DISORDER 10/03/2008  . Elevated LFTs 12/29/2007  . HYPERCHOLESTEROLEMIA 03/21/2007  . Essential hypertension 03/21/2007    Past Surgical History:  Procedure Laterality Date  . BREAST  SURGERY    . BREAST SURGERY     bilateral breast silocone  removal  . CHEST SURGERY    . left kidney removal    . left leg surgery     "popiteal artery clogged"  . MASTECTOMY Bilateral   . NEPHRECTOMY Left   . ORIF CLAVICULAR FRACTURE Left 08/10/2017   Procedure: OPEN REDUCTION INTERNAL FIXATION (ORIF) LEFT CLAVICLE FRACTURE WITH RECONSTRUCTION OF CORACOCLAVICULAR LIGAMENT;  Surgeon: Leandrew Koyanagi, MD;  Location: [facility];  Service: Orthopedics;  Laterality: Left;  . RECONSTRUCTION OF CORACOCLAVICULAR LIGAMENT Left 08/10/2017   Procedure: RECONSTRUCTION OF CORACOCLAVICULAR LIGAMENT;  Surgeon: Leandrew Koyanagi, MD;  Location: [facility];  Service: Orthopedics;  Laterality: Left;        Home Medications    Prior to Admission medications   Medication Sig Start Date End Date Taking? Authorizing Provider  amLODipine (NORVASC) 5 MG tablet Take 1 tablet (5 mg total) by mouth daily. 12/15/17   Eugenie Filler, MD  busPIRone (BUSPAR) 10 MG tablet Take 1 tablet (10 mg total) by mouth 3 (three) times daily. 12/15/17   Eugenie Filler, MD  clonazePAM (KLONOPIN) 1 MG tablet Take 1 mg by mouth at bedtime as needed for anxiety.    [provider]  Ibuprofen 200 MG CAPS Take 400 mg by mouth daily as needed (pain).    [provider]  QUEtiapine (SEROQUEL) 100 MG tablet Take 1 tablet (100 mg total) by mouth at bedtime. 12/15/17   Eugenie Filler, MD  ziprasidone (GEODON) 20 MG capsule Take 20 mg by mouth daily.    [provider]    Family History Family History  Problem Relation Age of Onset  . Stroke Other   . Cancer Other   . Hyperlipidemia Mother   . Hypertension Mother     Social History Social History   Tobacco Use  . Smoking status: Former Research scientist (life sciences)  . Smokeless tobacco: Never Used  . Tobacco comment: smoked on and off 16 years- quit 2016  Substance Use Topics  . Alcohol use: Yes    Comment: 5 bottle of wine daily, last consumption 1 hr PTA  . Drug use: No     Frequency: 1.0 times per week    Types: "Crack" cocaine, Cocaine    Comment: clean 3 weeks     Allergies   Codeine; Penicillins; Morphine; Coconut flavor; Coconut oil; Grapefruit concentrate; Morphine and related; Oxycodone; and Norco [hydrocodone-acetaminophen]   Review of Systems Review of Systems  All other systems reviewed and are negative.    Physical Exam Updated Vital Signs BP (!) 155/90   Pulse (!) 126   Physical Exam  Constitutional: He is oriented to person, place, and time. He appears well-developed and well-nourished.  Non-toxic appearance. No distress.  HENT:  Head: Normocephalic and atraumatic.  Mouth/Throat:    Eyes: Pupils are equal, round, and reactive to light. Conjunctivae, EOM and lids are normal.  Neck: Normal range of motion. Neck supple. No tracheal deviation present. No thyroid mass present.  Cardiovascular: Regular rhythm and normal heart sounds. Tachycardia present.  Exam reveals no gallop.  No murmur heard. Pulmonary/Chest: Effort normal and breath sounds normal. No stridor. No respiratory distress. He has no decreased breath sounds. He has no wheezes. He has no rhonchi. He has no rales.  Abdominal: Soft. Normal appearance and bowel sounds are normal. He exhibits no distension. There is no tenderness. There is no rebound and no CVA tenderness.  Musculoskeletal: Normal range of motion. He exhibits no edema or tenderness.  Neurological: He is alert and oriented to person, place, and time. He displays tremor. No cranial nerve deficit. GCS eye subscore is 4. GCS verbal subscore is 5. GCS motor subscore is 6.  Skin: Skin is warm and dry. No abrasion and no rash noted.  Psychiatric: His speech is normal and behavior is normal. His affect is blunt.  Nursing note and vitals reviewed.    ED Treatments / Results  Labs (all labs ordered are listed, but only abnormal results are displayed) Labs Reviewed  CBC - Abnormal; Notable for the following components:       Result Value   WBC 3.3 (*)    RDW 16.3 (*)    All other components within normal limits  COMPREHENSIVE METABOLIC PANEL  ETHANOL  RAPID URINE DRUG SCREEN, HOSP PERFORMED  TROPONIN I  CBG MONITORING, ED    EKG EKG Interpretation  Date/Time:  Friday December 30 2017 16:13:46 EDT Ventricular Rate:  125 PR Interval:  122 QRS Duration: 84 QT Interval:  314 QTC Calculation: 453 R Axis:   62 Text Interpretation:  Sinus tachycardia Otherwise normal ECG Confirmed by Lacretia Leigh ([id number]) on 12/30/2017 5:13:16 PM   Radiology Dg Chest 2 View  Result Date: 12/30/2017 CLINICAL DATA:  Seizure today. EXAM: CHEST - 2 VIEW COMPARISON:  Chest x-ray dated 12/22/2017. FINDINGS: The heart size and mediastinal contours are within normal limits. Both lungs are clear. The visualized skeletal structures are unremarkable. IMPRESSION: No active cardiopulmonary disease. Electronically Signed   By: Franki Cabot M.D.   On: 12/30/2017 17:36   Ct Head Wo Contrast  Result Date: 12/30/2017 CLINICAL DATA:  Injuries from seizure, RIGHT cheek and bottom lip are swollen, tachycardia, ETOH. EXAM: CT HEAD WITHOUT CONTRAST CT MAXILLOFACIAL WITHOUT CONTRAST CT CERVICAL SPINE WITHOUT CONTRAST TECHNIQUE: Multidetector CT imaging of the head, cervical spine, and maxillofacial structures were performed using the standard protocol without intravenous contrast. Multiplanar CT image reconstructions of the cervical spine and maxillofacial structures were also generated. COMPARISON:  Head CT dated 12/12/2017. FINDINGS: CT HEAD FINDINGS Brain: Ventricles are stable in size and configuration. There is no mass, hemorrhage, edema or other evidence of acute parenchymal abnormality. No extra-axial hemorrhage. Vascular: No hyperdense vessel or unexpected calcification. Skull: Normal. Negative for fracture or focal lesion. Other: None. CT MAXILLOFACIAL FINDINGS Osseous: Lower frontal bones are intact and normally aligned. No displaced nasal  bone fracture seen. Osseous structures about the orbits are intact and normally aligned. Walls of the maxillary sinuses are intact and normally aligned. Bilateral zygomatic arches and pterygoid plates are intact. No mandible fracture or displacement seen. Multiple missing teeth. Lucencies at the bases of multiple remaining maxillary and mandibular teeth raise the possibility of periodontal abscesses. Orbits: Negative. No traumatic or inflammatory finding. Sinuses: Clear. Soft tissues: Ill-defined edema within the subcutaneous soft tissues overlying the maxilla and mandible, bilateral, most prominent along the anterior margin of the lower mandible. No circumscribed soft tissue hematoma seen. CT CERVICAL SPINE FINDINGS Alignment: Mild levoscoliosis. No evidence of acute vertebral body subluxation. Skull base and vertebrae:  No fracture line or displaced fracture fragment. Facet joints appear intact and normally aligned throughout. Soft tissues and spinal canal: No prevertebral fluid or swelling. No visible canal hematoma. Disc levels: Mild disc desiccations at multiple levels, but no more than mild central canal stenosis at any level. Upper chest: Presumed soft tissue edema within the subcutaneous soft tissues of the upper chest Other: None. IMPRESSION: 1. No acute intracranial abnormality. No intracranial hemorrhage or edema. No skull fracture. 2. No facial bone fracture or dislocation. Lucencies at the bases of multiple maxillary and mandibular teeth raising the possibility of periodontal abscesses. 3. Ill-defined edema within the subcutaneous soft tissues overlying the bilateral maxilla and mandible, most prominent overlying the anterior margin of the lower mandible, without circumscribed hematoma. 4. No fracture or acute subluxation within the cervical spine. 5. Soft tissue thickening/nodularity within the subcutaneous soft tissues of the upper chest, similar soft tissue mass/nodularity demonstrated on earlier  chest CT of 06/02/2015 suggesting benignity, considerations including superficial varices or postsurgical change. Electronically Signed   By: Franki Cabot M.D.   On: 12/30/2017 17:35   Ct Cervical Spine Wo Contrast  Result Date: 12/30/2017 CLINICAL DATA:  Injuries from seizure, RIGHT cheek and bottom lip are swollen, tachycardia, ETOH. EXAM: CT HEAD WITHOUT CONTRAST CT MAXILLOFACIAL WITHOUT CONTRAST CT CERVICAL SPINE WITHOUT CONTRAST TECHNIQUE: Multidetector CT imaging of the head, cervical spine, and maxillofacial structures were performed using the standard protocol without intravenous contrast. Multiplanar CT image reconstructions of the cervical spine and maxillofacial structures were also generated. COMPARISON:  Head CT dated 12/12/2017. FINDINGS: CT HEAD FINDINGS Brain: Ventricles are stable in size and configuration. There is no mass, hemorrhage, edema or other evidence of acute parenchymal abnormality. No extra-axial hemorrhage. Vascular: No hyperdense vessel or unexpected calcification. Skull: Normal. Negative for fracture or focal lesion. Other: None. CT MAXILLOFACIAL FINDINGS Osseous: Lower frontal bones are intact and normally aligned. No displaced nasal bone fracture seen. Osseous structures about the orbits are intact and normally aligned. Walls of the maxillary sinuses are intact and normally aligned. Bilateral zygomatic arches and pterygoid plates are intact. No mandible fracture or displacement seen. Multiple missing teeth. Lucencies at the bases of multiple remaining maxillary and mandibular teeth raise the possibility of periodontal abscesses. Orbits: Negative. No traumatic or inflammatory finding. Sinuses: Clear. Soft tissues: Ill-defined edema within the subcutaneous soft tissues overlying the maxilla and mandible, bilateral, most prominent along the anterior margin of the lower mandible. No circumscribed soft tissue hematoma seen. CT CERVICAL SPINE FINDINGS Alignment: Mild levoscoliosis.  No evidence of acute vertebral body subluxation. Skull base and vertebrae: No fracture line or displaced fracture fragment. Facet joints appear intact and normally aligned throughout. Soft tissues and spinal canal: No prevertebral fluid or swelling. No visible canal hematoma. Disc levels: Mild disc desiccations at multiple levels, but no more than mild central canal stenosis at any level. Upper chest: Presumed soft tissue edema within the subcutaneous soft tissues of the upper chest Other: None. IMPRESSION: 1. No acute intracranial abnormality. No intracranial hemorrhage or edema. No skull fracture. 2. No facial bone fracture or dislocation. Lucencies at the bases of multiple maxillary and mandibular teeth raising the possibility of periodontal abscesses. 3. Ill-defined edema within the subcutaneous soft tissues overlying the bilateral maxilla and mandible, most prominent overlying the anterior margin of the lower mandible, without circumscribed hematoma. 4. No fracture or acute subluxation within the cervical spine. 5. Soft tissue thickening/nodularity within the subcutaneous soft tissues of the upper chest, similar soft tissue mass/nodularity  demonstrated on earlier chest CT of 06/02/2015 suggesting benignity, considerations including superficial varices or postsurgical change. Electronically Signed   By: Franki Cabot M.D.   On: 12/30/2017 17:35   Ct Maxillofacial Wo Contrast  Result Date: 12/30/2017 CLINICAL DATA:  Injuries from seizure, RIGHT cheek and bottom lip are swollen, tachycardia, ETOH. EXAM: CT HEAD WITHOUT CONTRAST CT MAXILLOFACIAL WITHOUT CONTRAST CT CERVICAL SPINE WITHOUT CONTRAST TECHNIQUE: Multidetector CT imaging of the head, cervical spine, and maxillofacial structures were performed using the standard protocol without intravenous contrast. Multiplanar CT image reconstructions of the cervical spine and maxillofacial structures were also generated. COMPARISON:  Head CT dated 12/12/2017.  FINDINGS: CT HEAD FINDINGS Brain: Ventricles are stable in size and configuration. There is no mass, hemorrhage, edema or other evidence of acute parenchymal abnormality. No extra-axial hemorrhage. Vascular: No hyperdense vessel or unexpected calcification. Skull: Normal. Negative for fracture or focal lesion. Other: None. CT MAXILLOFACIAL FINDINGS Osseous: Lower frontal bones are intact and normally aligned. No displaced nasal bone fracture seen. Osseous structures about the orbits are intact and normally aligned. Walls of the maxillary sinuses are intact and normally aligned. Bilateral zygomatic arches and pterygoid plates are intact. No mandible fracture or displacement seen. Multiple missing teeth. Lucencies at the bases of multiple remaining maxillary and mandibular teeth raise the possibility of periodontal abscesses. Orbits: Negative. No traumatic or inflammatory finding. Sinuses: Clear. Soft tissues: Ill-defined edema within the subcutaneous soft tissues overlying the maxilla and mandible, bilateral, most prominent along the anterior margin of the lower mandible. No circumscribed soft tissue hematoma seen. CT CERVICAL SPINE FINDINGS Alignment: Mild levoscoliosis. No evidence of acute vertebral body subluxation. Skull base and vertebrae: No fracture line or displaced fracture fragment. Facet joints appear intact and normally aligned throughout. Soft tissues and spinal canal: No prevertebral fluid or swelling. No visible canal hematoma. Disc levels: Mild disc desiccations at multiple levels, but no more than mild central canal stenosis at any level. Upper chest: Presumed soft tissue edema within the subcutaneous soft tissues of the upper chest Other: None. IMPRESSION: 1. No acute intracranial abnormality. No intracranial hemorrhage or edema. No skull fracture. 2. No facial bone fracture or dislocation. Lucencies at the bases of multiple maxillary and mandibular teeth raising the possibility of periodontal  abscesses. 3. Ill-defined edema within the subcutaneous soft tissues overlying the bilateral maxilla and mandible, most prominent overlying the anterior margin of the lower mandible, without circumscribed hematoma. 4. No fracture or acute subluxation within the cervical spine. 5. Soft tissue thickening/nodularity within the subcutaneous soft tissues of the upper chest, similar soft tissue mass/nodularity demonstrated on earlier chest CT of 06/02/2015 suggesting benignity, considerations including superficial varices or postsurgical change. Electronically Signed   By: Franki Cabot M.D.   On: 12/30/2017 17:35    Procedures Procedures (including critical care time)  Medications Ordered in ED Medications  LORazepam (ATIVAN) injection 0-4 mg (has no administration in time range)    Or  LORazepam (ATIVAN) tablet 0-4 mg (has no administration in time range)  LORazepam (ATIVAN) injection 0-4 mg (has no administration in time range)    Or  LORazepam (ATIVAN) tablet 0-4 mg (has no administration in time range)  thiamine (VITAMIN B-1) tablet 100 mg (has no administration in time range)    Or  thiamine (B-1) injection 100 mg (has no administration in time range)     Initial Impression / Assessment and Plan / ED Course  I have reviewed the triage vital signs and the nursing notes.  Pertinent  labs & imaging results that were available during my care of the patient were reviewed by me and considered in my medical decision making (see chart for details).     Patient is tachycardic here noted.  Will be treated with IV fluids.  Did have a ciwa score of 15.  Was placed on Ativan withdrawal protocol including being given 2 mg of Ativan IV push here.  His imaging did not show any acute findings in terms of fractures of his C-spine or cervical spine.  Will admit to the hospitalist service   CRITICAL CARE Performed by: Leota Jacobsen Total critical care time: 45 minutes Critical care time was exclusive  of separately billable procedures and treating other patients. Critical care was necessary to treat or prevent imminent or life-threatening deterioration. Critical care was time spent personally by me on the following activities: development of treatment plan with patient and/or surrogate as well as nursing, discussions with consultants, evaluation of patient's response to treatment, examination of patient, obtaining history from patient or surrogate, ordering and performing treatments and interventions, ordering and review of laboratory studies, ordering and review of radiographic studies, pulse oximetry and re-evaluation of patient's condition.  Final Clinical Impressions(s) / ED Diagnoses   Final diagnoses:  None    ED Discharge Orders    None       Lacretia Leigh, MD 12/30/17 1805

## 2017-12-30 NOTE — ED Triage Notes (Signed)
Pt presents with injuries from a seizure that occurred 30 mins  PTA; R cheek and bottom lip swollen; tachycardia noted in triage; pt states he wants to detox from ETOH, last drink was 1 hr PTA; pt here with his mother; pt states 3 seizures in last week; seen at [facility] and discharged this morning

## 2017-12-30 NOTE — ED Notes (Signed)
Pt discharged safely with Resources and bus pass.  All belongings were returned to patient.

## 2017-12-30 NOTE — BH Assessment (Signed)
[facility] Assessment Progress Note  Per Buford Dresser, DO, this pt does not require psychiatric hospitalization at this time.  Pt is to be discharged from [facility] with recommendation to follow up with [facility].  This has been included in pt's discharge instructions.  Pt's nurse, Nena [name], has been notified.  Jalene Mullet, [place] Triage Specialist [phone]

## 2017-12-30 NOTE — ED Notes (Signed)
Mother at bedside reporting when they called the crisis hotline, patient reported suicidal thoughts, however when triage RN asked, patient denied.

## 2017-12-30 NOTE — H&P (Addendum)
History and Physical    [name] YIR:[id number] DOB: [birth year] DOA: 12/30/2017  PCP: [facility]  Patient coming from: home   Chief Complaint: seizure  HPI: [name] is a 37 y.o. male with medical history significant for schizophrenia, alcohol dependence, alcohol withdrawal seizure, cirrhosis, htn, pancreatitis, breast cancer s/p mastectomy, cocaine abuse, thrombocytopenia, presenting with the above  Multiple recent admissions: two this month for alcohol detox, notable for etoh withdrawal seizure. Also admit in April and January for suicidal ideation. Presents today with seizure at home. Says was witnessed by one of his roommates. Bit his lip in the process. Denies SI but SI is reported in history obtained by ED provider. Says usually drinks 1/2 to 1 gallon of hard alcohol a day, most recently a few hours prior to arrival. Says unsure if has been diagnosed with epilepsy. Is not on an anti-epileptic. Desires detox. Denies inury other than to lip. No chest pain.  ED Course: bh eval, labs, CT head/c-spine  Review of Systems: As per HPI otherwise 10 point review of systems negative.    Past Medical History:  Diagnosis Date  . Angina   . Anxiety    panic attack  . Bipolar 1 disorder ([place])   . Breast CA ([place]) dx'd 2009   bil w/ bil masectomy and oral meds  . Cancer [facility])    kidney cancer  . Coronary artery disease   . Depression   . H/O suicide attempt 2015   overdose  . Headache(784.0)   . Hypercholesteremia   . Hypertension   . Liver cirrhosis ([place])   . Pancreatitis   . Peripheral vascular disease [facility]) April 2011   Left Pop  . Schizophrenia ([place])   . Seizures ([place])    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  . Shortness of breath     Past Surgical History:  Procedure Laterality Date  . BREAST SURGERY    . BREAST SURGERY     bilateral breast silocone  removal  . CHEST SURGERY    . left kidney removal    . left leg surgery     "popiteal  artery clogged"  . MASTECTOMY Bilateral   . NEPHRECTOMY Left   . ORIF CLAVICULAR FRACTURE Left 08/10/2017   Procedure: OPEN REDUCTION INTERNAL FIXATION (ORIF) LEFT CLAVICLE FRACTURE WITH RECONSTRUCTION OF CORACOCLAVICULAR LIGAMENT;  Surgeon: Leandrew Koyanagi, MD;  Location: [place];  Service: Orthopedics;  Laterality: Left;  . RECONSTRUCTION OF CORACOCLAVICULAR LIGAMENT Left 08/10/2017   Procedure: RECONSTRUCTION OF CORACOCLAVICULAR LIGAMENT;  Surgeon: Leandrew Koyanagi, MD;  Location: [place];  Service: Orthopedics;  Laterality: Left;     reports that he has quit smoking. He has never used smokeless tobacco. He reports that he drinks alcohol. He reports that he does not use drugs.  Allergies  Allergen Reactions  . Codeine Hives, Itching, Swelling and Other (See Comments)    Does not impair breathing, however  . Penicillins Swelling and Other (See Comments)    Has patient had a PCN reaction causing immediate rash, facial/tongue/throat swelling, SOB or lightheadedness with hypotension: Yes Has patient had a PCN reaction causing severe rash involving mucus membranes or skin necrosis: Yes Has patient had a PCN reaction that required hospitalization Yes-ed visit Has patient had a PCN reaction occurring within the last 10 years: Yes If all of the above answers are "NO", then may proceed with Cephalosporin use.   Marland Kitchen Morphine Itching  . Coconut Flavor Itching and Swelling  .  Coconut Oil Other (See Comments)    Cannot take with some of his meds  . Grapefruit Concentrate Other (See Comments)    Cannot take with some of his meds  . Morphine And Related Itching and Swelling  . Oxycodone Itching and Swelling  . Norco [Hydrocodone-Acetaminophen] Itching and Rash    Family History  Problem Relation Age of Onset  . Stroke Other   . Cancer Other   . Hyperlipidemia Mother   . Hypertension Mother     Prior to Admission medications   Medication Sig Start Date End Date Taking? Authorizing Provider    amLODipine (NORVASC) 5 MG tablet Take 1 tablet (5 mg total) by mouth daily. 12/15/17  Yes Eugenie Filler, MD  busPIRone (BUSPAR) 10 MG tablet Take 1 tablet (10 mg total) by mouth 3 (three) times daily. 12/15/17  Yes Eugenie Filler, MD  clonazePAM (KLONOPIN) 1 MG tablet Take 1 mg by mouth at bedtime as needed for anxiety.   Yes [provider]  Ibuprofen 200 MG CAPS Take 400 mg by mouth daily as needed (pain).   Yes [provider]  QUEtiapine (SEROQUEL) 100 MG tablet Take 1 tablet (100 mg total) by mouth at bedtime. 12/15/17  Yes Eugenie Filler, MD  ziprasidone (GEODON) 20 MG capsule Take 20 mg by mouth daily.   Yes [provider]    Physical Exam: Vitals:   12/30/17 1611 12/30/17 1758 12/30/17 1811  BP: (!) 155/90 (!) 151/98 (!) 151/98  Pulse: (!) 126 (!) 125 (!) 125  Resp:  18   Temp:  98.7 F (37.1 C)   TempSrc:  Oral   SpO2:  99%     Constitutional: No acute distress Head: Atraumatic save for contusion lower lip Eyes: Conjunctiva clear ENM: Moist mucous membranes. poordentition.  Neck: Supple Respiratory: Clear to auscultation bilaterally, no wheezing/rales/rhonchi. Normal respiratory effort. No accessory muscle use. . Cardiovascular: Regular rate and rhythm. No murmurs/rubs/gallops. Abdomen: Non-tender, non-distended. No masses. No rebound or guarding. Positive bowel sounds. Musculoskeletal: No joint deformity upper and lower extremities. Normal ROM, no contractures. Normal muscle tone.  Skin: No rashes, lesions, or ulcers.  Extremities: No peripheral edema. Palpable peripheral pulses. Neurologic: Alert, moving all 4 extremities. Psychiatric: Normal insight and judgement.   Labs on Admission: I have personally reviewed following labs and imaging studies  CBC: Recent Labs  Lab 12/30/17 0009 12/30/17 1613  WBC 3.9* 3.3*  NEUTROABS 1.4*  --   HGB 13.7 13.9  HCT 40.8 42.2  MCV 91.1 90.9  PLT 157 170   Basic Metabolic  Panel: Recent Labs  Lab 12/30/17 0009 12/30/17 1613  NA 146* 142  K 4.0 3.9  CL 107 105  CO2 28 24  GLUCOSE 89 164*  BUN 11 6  CREATININE 0.78 0.87  CALCIUM 8.3* 8.6*   GFR: Estimated Creatinine Clearance: 103.9 mL/min (by C-G formula based on SCr of 0.87 mg/dL). Liver Function Tests: Recent Labs  Lab 12/30/17 0009 12/30/17 1613  AST 87* 87*  ALT 65* 63  ALKPHOS 71 71  BILITOT 0.3 0.2*  PROT 7.2 7.1  ALBUMIN 3.7 3.7   No results for input(s): LIPASE, AMYLASE in the last 168 hours. No results for input(s): AMMONIA in the last 168 hours. Coagulation Profile: No results for input(s): INR, PROTIME in the last 168 hours. Cardiac Enzymes: Recent Labs  Lab 12/30/17 1613  TROPONINI <0.03   BNP (last 3 results) No results for input(s): PROBNP in the last 8760 hours. HbA1C:  No results for input(s): HGBA1C in the last 72 hours. CBG: No results for input(s): GLUCAP in the last 168 hours. Lipid Profile: No results for input(s): CHOL, HDL, LDLCALC, TRIG, CHOLHDL, LDLDIRECT in the last 72 hours. Thyroid Function Tests: No results for input(s): TSH, T4TOTAL, FREET4, T3FREE, THYROIDAB in the last 72 hours. Anemia Panel: No results for input(s): VITAMINB12, FOLATE, FERRITIN, TIBC, IRON, RETICCTPCT in the last 72 hours. Urine analysis:    Component Value Date/Time   COLORURINE YELLOW 12/12/2017 1951   APPEARANCEUR CLEAR 12/12/2017 1951   LABSPEC 1.018 12/12/2017 1951   PHURINE 7.0 12/12/2017 1951   GLUCOSEU NEGATIVE 12/12/2017 1951   HGBUR SMALL (A) 12/12/2017 1951   HGBUR negative 12/22/2007 0831   BILIRUBINUR NEGATIVE 12/12/2017 1951   KETONESUR 20 (A) 12/12/2017 1951   PROTEINUR 100 (A) 12/12/2017 1951   UROBILINOGEN 1.0 06/02/2015 2032   NITRITE NEGATIVE 12/12/2017 1951   LEUKOCYTESUR NEGATIVE 12/12/2017 1951    Radiological Exams on Admission: Dg Chest 2 View  Result Date: 12/30/2017 CLINICAL DATA:  Seizure today. EXAM: CHEST - 2 VIEW COMPARISON:  Chest x-ray  dated 12/22/2017. FINDINGS: The heart size and mediastinal contours are within normal limits. Both lungs are clear. The visualized skeletal structures are unremarkable. IMPRESSION: No active cardiopulmonary disease. Electronically Signed   By: Franki Cabot M.D.   On: 12/30/2017 17:36   Ct Head Wo Contrast  Result Date: 12/30/2017 CLINICAL DATA:  Injuries from seizure, RIGHT cheek and bottom lip are swollen, tachycardia, ETOH. EXAM: CT HEAD WITHOUT CONTRAST CT MAXILLOFACIAL WITHOUT CONTRAST CT CERVICAL SPINE WITHOUT CONTRAST TECHNIQUE: Multidetector CT imaging of the head, cervical spine, and maxillofacial structures were performed using the standard protocol without intravenous contrast. Multiplanar CT image reconstructions of the cervical spine and maxillofacial structures were also generated. COMPARISON:  Head CT dated 12/12/2017. FINDINGS: CT HEAD FINDINGS Brain: Ventricles are stable in size and configuration. There is no mass, hemorrhage, edema or other evidence of acute parenchymal abnormality. No extra-axial hemorrhage. Vascular: No hyperdense vessel or unexpected calcification. Skull: Normal. Negative for fracture or focal lesion. Other: None. CT MAXILLOFACIAL FINDINGS Osseous: Lower frontal bones are intact and normally aligned. No displaced nasal bone fracture seen. Osseous structures about the orbits are intact and normally aligned. Walls of the maxillary sinuses are intact and normally aligned. Bilateral zygomatic arches and pterygoid plates are intact. No mandible fracture or displacement seen. Multiple missing teeth. Lucencies at the bases of multiple remaining maxillary and mandibular teeth raise the possibility of periodontal abscesses. Orbits: Negative. No traumatic or inflammatory finding. Sinuses: Clear. Soft tissues: Ill-defined edema within the subcutaneous soft tissues overlying the maxilla and mandible, bilateral, most prominent along the anterior margin of the lower mandible. No  circumscribed soft tissue hematoma seen. CT CERVICAL SPINE FINDINGS Alignment: Mild levoscoliosis. No evidence of acute vertebral body subluxation. Skull base and vertebrae: No fracture line or displaced fracture fragment. Facet joints appear intact and normally aligned throughout. Soft tissues and spinal canal: No prevertebral fluid or swelling. No visible canal hematoma. Disc levels: Mild disc desiccations at multiple levels, but no more than mild central canal stenosis at any level. Upper chest: Presumed soft tissue edema within the subcutaneous soft tissues of the upper chest Other: None. IMPRESSION: 1. No acute intracranial abnormality. No intracranial hemorrhage or edema. No skull fracture. 2. No facial bone fracture or dislocation. Lucencies at the bases of multiple maxillary and mandibular teeth raising the possibility of periodontal abscesses. 3. Ill-defined edema within the subcutaneous soft tissues overlying  the bilateral maxilla and mandible, most prominent overlying the anterior margin of the lower mandible, without circumscribed hematoma. 4. No fracture or acute subluxation within the cervical spine. 5. Soft tissue thickening/nodularity within the subcutaneous soft tissues of the upper chest, similar soft tissue mass/nodularity demonstrated on earlier chest CT of 06/02/2015 suggesting benignity, considerations including superficial varices or postsurgical change. Electronically Signed   By: Franki Cabot M.D.   On: 12/30/2017 17:35   Ct Cervical Spine Wo Contrast  Result Date: 12/30/2017 CLINICAL DATA:  Injuries from seizure, RIGHT cheek and bottom lip are swollen, tachycardia, ETOH. EXAM: CT HEAD WITHOUT CONTRAST CT MAXILLOFACIAL WITHOUT CONTRAST CT CERVICAL SPINE WITHOUT CONTRAST TECHNIQUE: Multidetector CT imaging of the head, cervical spine, and maxillofacial structures were performed using the standard protocol without intravenous contrast. Multiplanar CT image reconstructions of the cervical  spine and maxillofacial structures were also generated. COMPARISON:  Head CT dated 12/12/2017. FINDINGS: CT HEAD FINDINGS Brain: Ventricles are stable in size and configuration. There is no mass, hemorrhage, edema or other evidence of acute parenchymal abnormality. No extra-axial hemorrhage. Vascular: No hyperdense vessel or unexpected calcification. Skull: Normal. Negative for fracture or focal lesion. Other: None. CT MAXILLOFACIAL FINDINGS Osseous: Lower frontal bones are intact and normally aligned. No displaced nasal bone fracture seen. Osseous structures about the orbits are intact and normally aligned. Walls of the maxillary sinuses are intact and normally aligned. Bilateral zygomatic arches and pterygoid plates are intact. No mandible fracture or displacement seen. Multiple missing teeth. Lucencies at the bases of multiple remaining maxillary and mandibular teeth raise the possibility of periodontal abscesses. Orbits: Negative. No traumatic or inflammatory finding. Sinuses: Clear. Soft tissues: Ill-defined edema within the subcutaneous soft tissues overlying the maxilla and mandible, bilateral, most prominent along the anterior margin of the lower mandible. No circumscribed soft tissue hematoma seen. CT CERVICAL SPINE FINDINGS Alignment: Mild levoscoliosis. No evidence of acute vertebral body subluxation. Skull base and vertebrae: No fracture line or displaced fracture fragment. Facet joints appear intact and normally aligned throughout. Soft tissues and spinal canal: No prevertebral fluid or swelling. No visible canal hematoma. Disc levels: Mild disc desiccations at multiple levels, but no more than mild central canal stenosis at any level. Upper chest: Presumed soft tissue edema within the subcutaneous soft tissues of the upper chest Other: None. IMPRESSION: 1. No acute intracranial abnormality. No intracranial hemorrhage or edema. No skull fracture. 2. No facial bone fracture or dislocation. Lucencies at  the bases of multiple maxillary and mandibular teeth raising the possibility of periodontal abscesses. 3. Ill-defined edema within the subcutaneous soft tissues overlying the bilateral maxilla and mandible, most prominent overlying the anterior margin of the lower mandible, without circumscribed hematoma. 4. No fracture or acute subluxation within the cervical spine. 5. Soft tissue thickening/nodularity within the subcutaneous soft tissues of the upper chest, similar soft tissue mass/nodularity demonstrated on earlier chest CT of 06/02/2015 suggesting benignity, considerations including superficial varices or postsurgical change. Electronically Signed   By: Franki Cabot M.D.   On: 12/30/2017 17:35   Ct Maxillofacial Wo Contrast  Result Date: 12/30/2017 CLINICAL DATA:  Injuries from seizure, RIGHT cheek and bottom lip are swollen, tachycardia, ETOH. EXAM: CT HEAD WITHOUT CONTRAST CT MAXILLOFACIAL WITHOUT CONTRAST CT CERVICAL SPINE WITHOUT CONTRAST TECHNIQUE: Multidetector CT imaging of the head, cervical spine, and maxillofacial structures were performed using the standard protocol without intravenous contrast. Multiplanar CT image reconstructions of the cervical spine and maxillofacial structures were also generated. COMPARISON:  Head CT dated 12/12/2017. FINDINGS:  CT HEAD FINDINGS Brain: Ventricles are stable in size and configuration. There is no mass, hemorrhage, edema or other evidence of acute parenchymal abnormality. No extra-axial hemorrhage. Vascular: No hyperdense vessel or unexpected calcification. Skull: Normal. Negative for fracture or focal lesion. Other: None. CT MAXILLOFACIAL FINDINGS Osseous: Lower frontal bones are intact and normally aligned. No displaced nasal bone fracture seen. Osseous structures about the orbits are intact and normally aligned. Walls of the maxillary sinuses are intact and normally aligned. Bilateral zygomatic arches and pterygoid plates are intact. No mandible fracture or  displacement seen. Multiple missing teeth. Lucencies at the bases of multiple remaining maxillary and mandibular teeth raise the possibility of periodontal abscesses. Orbits: Negative. No traumatic or inflammatory finding. Sinuses: Clear. Soft tissues: Ill-defined edema within the subcutaneous soft tissues overlying the maxilla and mandible, bilateral, most prominent along the anterior margin of the lower mandible. No circumscribed soft tissue hematoma seen. CT CERVICAL SPINE FINDINGS Alignment: Mild levoscoliosis. No evidence of acute vertebral body subluxation. Skull base and vertebrae: No fracture line or displaced fracture fragment. Facet joints appear intact and normally aligned throughout. Soft tissues and spinal canal: No prevertebral fluid or swelling. No visible canal hematoma. Disc levels: Mild disc desiccations at multiple levels, but no more than mild central canal stenosis at any level. Upper chest: Presumed soft tissue edema within the subcutaneous soft tissues of the upper chest Other: None. IMPRESSION: 1. No acute intracranial abnormality. No intracranial hemorrhage or edema. No skull fracture. 2. No facial bone fracture or dislocation. Lucencies at the bases of multiple maxillary and mandibular teeth raising the possibility of periodontal abscesses. 3. Ill-defined edema within the subcutaneous soft tissues overlying the bilateral maxilla and mandible, most prominent overlying the anterior margin of the lower mandible, without circumscribed hematoma. 4. No fracture or acute subluxation within the cervical spine. 5. Soft tissue thickening/nodularity within the subcutaneous soft tissues of the upper chest, similar soft tissue mass/nodularity demonstrated on earlier chest CT of 06/02/2015 suggesting benignity, considerations including superficial varices or postsurgical change. Electronically Signed   By: Franki Cabot M.D.   On: 12/30/2017 17:35    EKG: Independently reviewed. Sinus  tach  Assessment/Plan Active Problems:   Essential hypertension   Elevated LFTs   Alcohol abuse   Episode of recurrent major depressive disorder ([facility])   Seizure ([place])   Suicidal ideation   Alcohol withdrawal ([place])   Alcohol withdrawal seizure without complication ([place])    # Alcohol abuse disorder # Alcohol withdrawal # Alcohol withdrawal seizure - has received ativan in ED. Currently mildly tachycardic and hypertensive, but calm and not tremulous. No seizure activity since arrival to ED. CT head/neck neg. No sig electrolyte abnormalities, normal renal function.  - CIWA - IV ativan, transition to PO when able - step-down for now - npo for now - seizure precautions - IV fluids - thiamine, folate, mvi  # HTN - here moderately elevated in setting of withdrawal - continue home amlodipine, CTM   # Schizophrenia # Major depressive disorder # history of suicidal ideation - denies current si - continue home seroquel and geodon and buspar - consider bh consult prior to discharge  # Cirrhosis - likely 2/2 alcohol. LFTs mildly elevated and at baseline  DVT prophylaxis: lovenox, scds Code Status: full   Family Communication: mother Darla Lesches [phone]  Disposition Plan: tbd  Consults called: none  Admission status: step-down    Desma Maxim MD [facility] Pager [phone]  If 7PM-7AM, please contact night-coverage [email or web address] Password  [email or web address]  12/30/2017, 8:10 PM

## 2017-12-30 NOTE — Discharge Instructions (Signed)
For your mental health needs, you are advised to follow up with [facility].  New and returning patients are seen at their walk-in clinic.  Walk-in hours are Monday - Friday from 8:00 am - 3:00 pm.  Walk-in patients are seen on a first come, first served basis.  Try to arrive as early as possible for he best chance of being seen the same day: ° °     [facility] °     [address] °     [place], [place] [address] °     [phone] °

## 2017-12-30 NOTE — BH Assessment (Addendum)
Assessment Note  [name] is an 37 y.o. male, who presents voluntary and unaccompanied to [facility]. Clinician asked the pt, "what brought you to the hospital?" Pt reported, "had a seizure." Pt reported, he has a seizure disorder and he is in the process of getting medications to manage his seizures. Per RN note: "Pt to ED via [facility] with c/o of using Cocaine 20 minutes before [facility] arrived and then stated to them "he wanted to kill himself via overdose." Pt reported, he was suicidal within the last six months but is not currently. Pt reported, he has attempted suicide twenty-two times. Pt denies, SI, HI, AVH, self-injurious behaviors and access to weapons.  Pt denies abuse. Pt reported, drinking "a lot," of alcohol, today. Pt's BAL was 441 at 0009. Pt reported, he is linked to [facility] for medication management. Pt reported, he seen his psychiatrist this past Friday. Pt can not recall the name of prescribe medications. Pt has previous inpatient admissions.  Pt presents quiet/awake in a hospital gown with slurred speech. Pt's eye contact was sad, helpless. Pt's affect was congruent with mood. Pt's thought process was relevant. Pt's judgement was impaired. Pt was oriented x4. Pt's concentration was fair. Pt's insight and impulse control are poor. Pt reported, if discharged from [facility] he could contract for safety. Pt reported, if inpatient treatment is recommended he would sign-in voluntarily.   Diagnosis:  F31.4 Bipolar I Disorder, current depressed, severe.                     F10.20 Alcohol use Disorder, severe.  Past Medical History:  Past Medical History:  Diagnosis Date  . Angina   . Anxiety    panic attack  . Bipolar 1 disorder ([place])   . Breast CA ([place]) dx'd 2009   bil w/ bil masectomy and oral meds  . Cancer [facility])    kidney cancer  . Coronary artery disease   . Depression   . H/O suicide attempt 2015   overdose  . Headache(784.0)   . Hypercholesteremia   . Hypertension   . Liver cirrhosis  ([place])   . Pancreatitis   . Peripheral vascular disease [facility] April 2011   Left Pop  . Schizophrenia ([place])   . Seizures ([place])    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  . Shortness of breath     Past Surgical History:  Procedure Laterality Date  . BREAST SURGERY    . BREAST SURGERY     bilateral breast silocone  removal  . CHEST SURGERY    . left kidney removal    . left leg surgery     "popiteal artery clogged"  . MASTECTOMY Bilateral   . NEPHRECTOMY Left   . ORIF CLAVICULAR FRACTURE Left 08/10/2017   Procedure: OPEN REDUCTION INTERNAL FIXATION (ORIF) LEFT CLAVICLE FRACTURE WITH RECONSTRUCTION OF CORACOCLAVICULAR LIGAMENT;  Surgeon: Leandrew Koyanagi, MD;  Location: [place];  Service: Orthopedics;  Laterality: Left;  . RECONSTRUCTION OF CORACOCLAVICULAR LIGAMENT Left 08/10/2017   Procedure: RECONSTRUCTION OF CORACOCLAVICULAR LIGAMENT;  Surgeon: Leandrew Koyanagi, MD;  Location: [place];  Service: Orthopedics;  Laterality: Left;    Family History:  Family History  Problem Relation Age of Onset  . Stroke Other   . Cancer Other   . Hyperlipidemia Mother   . Hypertension Mother     Social History:  reports that he has quit smoking. He has never used smokeless tobacco. He reports that he drinks alcohol. He reports that he does  not use drugs.  Additional Social History:  Alcohol / Drug Use Pain Medications: See MAR Prescriptions: See MAR Over the Counter: See MAR Substance #1 Name of Substance 1: Alochol. 1 - Age of First Use: Per chart, 13.  1 - Amount (size/oz): Pt reported, "a lot." Pt's BAL was 441 at 0009. 1 - Frequency: UTA 1 - Duration: UTA 1 - Last Use / Amount: 12/30/2017.  CIWA: CIWA-Ar BP: 119/82 Pulse Rate: 85 COWS:    Allergies:  Allergies  Allergen Reactions  . Codeine Hives, Itching, Swelling and Other (See Comments)    Does not impair breathing, however  . Penicillins Swelling and Other (See Comments)    Has patient had a PCN reaction causing immediate rash,  facial/tongue/throat swelling, SOB or lightheadedness with hypotension: Yes Has patient had a PCN reaction causing severe rash involving mucus membranes or skin necrosis: Yes Has patient had a PCN reaction that required hospitalization Yes-ed visit Has patient had a PCN reaction occurring within the last 10 years: Yes If all of the above answers are "NO", then may proceed with Cephalosporin use.   Marland Kitchen Morphine Itching  . Coconut Flavor Itching and Swelling  . Coconut Oil Other (See Comments)    Cannot take with some of his meds  . Grapefruit Concentrate Other (See Comments)    Cannot take with some of his meds  . Morphine And Related Itching and Swelling  . Oxycodone Itching and Swelling  . Norco [Hydrocodone-Acetaminophen] Itching and Rash    Home Medications:  (Not in a hospital admission)  OB/GYN Status:  No LMP for male patient.  General Assessment Data Location of Assessment: [facility] TTS Assessment: In system Is this a Tele or Face-to-Face Assessment?: Face-to-Face Is this an Initial Assessment or a Re-assessment for this encounter?: Initial Assessment Marital status: Single Living Arrangements: Parent Can pt return to current living arrangement?: Yes Admission Status: Voluntary Is patient capable of signing voluntary admission?: Yes Referral Source: Self/Family/Friend Insurance type: Medicaid Potential.     Crisis Care Plan Living Arrangements: Parent Legal Guardian: Other:(Self. ) Name of Psychiatrist: [facility].  Name of Therapist: NA  Education Status Is patient currently in school?: No Is the patient employed, unemployed or receiving disability?: Unemployed  Risk to self with the past 6 months Suicidal Ideation: No-Not Currently/Within Last 6 Months Has patient been a risk to self within the past 6 months prior to admission? : Yes Suicidal Intent: No-Not Currently/Within Last 6 Months Has patient had any suicidal intent within the past 6 months prior to  admission? : Yes Suicidal Plan?: No-Not Currently/Within Last 6 Months Has patient had any suicidal plan within the past 6 months prior to admission? : Yes Specify Current Suicidal Plan: Earlier this morning pt had a plan to OD.  Access to Means: Yes Specify Access to Suicidal Means: Pt  has access to drugs.  What has been your use of drugs/alcohol within the last 12 months?: Alochol.  Previous Attempts/Gestures: Yes How many times?: 22 Other Self Harm Risks: Drug use.  Triggers for Past Attempts: Unknown Intentional Self Injurious Behavior: None(Pt denies. ) Family Suicide History: No Recent stressful life event(s): Other (Comment)(UTA) Persecutory voices/beliefs?: No Depression: No(Pt denies. ) Depression Symptoms: (Pt denies. ) Substance abuse history and/or treatment for substance abuse?: Yes Suicide prevention information given to non-admitted patients: Not applicable  Risk to Others within the past 6 months Homicidal Ideation: No(Pt denies. ) Does patient have any lifetime risk of violence toward others beyond the six  months prior to admission? : No Thoughts of Harm to Others: No Current Homicidal Intent: No Current Homicidal Plan: No Access to Homicidal Means: No Identified Victim: NA History of harm to others?: No Assessment of Violence: None Noted Violent Behavior Description: NA Does patient have access to weapons?: No(Pt denies. ) Criminal Charges Pending?: Yes Describe Pending Criminal Charges: Larceny. Does patient have a court date: Yes Court Date: 01/01/18 Is patient on probation?: No  Psychosis Hallucinations: None noted Delusions: None noted  Mental Status Report Appearance/Hygiene: In hospital gown, Disheveled Eye Contact: Poor Motor Activity: Unremarkable Speech: Slurred Level of Consciousness: Quiet/awake Anxiety Level: None Panic attack frequency: NA Most recent panic attack: NA Thought Processes: Relevant Judgement: Impaired Orientation:  Person, Place, Time, Situation Obsessive Compulsive Thoughts/Behaviors: None  Cognitive Functioning Concentration: Fair Memory: Recent Impaired Is patient IDD: No Is patient DD?: No Insight: Poor Impulse Control: Poor Appetite: Fair Sleep: Decreased Total Hours of Sleep: (Pt reported, 4-5 hours. ) Vegetative Symptoms: Unable to Assess  ADLScreening [facility] Assessment Services) [name] cognitive ability adequate to safely complete daily activities?: Yes Patient able to express need for assistance with ADLs?: Yes Independently performs ADLs?: Yes (appropriate for developmental age)  Prior Inpatient Therapy Prior Inpatient Therapy: Yes Prior Therapy Dates: Per chart, January 2019, other dates. Prior Therapy Facilty/Provider(s): Per chart, [facility] and [facility]. Reason for Treatment: Depression, SI and substance use.   Prior Outpatient Therapy Prior Outpatient Therapy: Yes Prior Therapy Dates: Current Prior Therapy Facilty/Provider(s): [facility] Reason for Treatment: Medication management. Does patient have an ACCT team?: No Does patient have Intensive In-House Services?  : No Does patient have [facility] services? : Yes Does patient have P4CC services?: No  ADL Screening (condition at time of admission) [name] cognitive ability adequate to safely complete daily activities?: Yes Is the patient deaf or have difficulty hearing?: No Does the patient have difficulty seeing, even when wearing glasses/contacts?: No Does the patient have difficulty concentrating, remembering, or making decisions?: No Patient able to express need for assistance with ADLs?: Yes Does the patient have difficulty dressing or bathing?: No Independently performs ADLs?: Yes (appropriate for developmental age) Does the patient have difficulty walking or climbing stairs?: No Weakness of Legs: None Weakness of Arms/Hands: None  Home Assistive Devices/Equipment Home Assistive Devices/Equipment: None     Abuse/Neglect Assessment (Assessment to be complete while patient is alone) Abuse/Neglect Assessment Can Be Completed: Yes Physical Abuse: Denies(Pt denies. ) Verbal Abuse: Denies(Pt denies. ) Sexual Abuse: Denies(Pt denies. ) Exploitation of patient/patient's resources: Denies(Pt denies. ) Self-Neglect: Denies(Pt denies. )     Advance Directives (For Healthcare) Does Patient Have a Medical Advance Directive?: No Would patient like information on creating a medical advance directive?: No - Patient declined    Additional Information 1:1 In Past 12 Months?: No CIRT Risk: No Elopement Risk: No Does patient have medical clearance?: Yes     Disposition: Lindon Romp, NP recommends overnight observation for safety and stabilization. Disposition discussed with Dr. Florina Ou and Judson Roch, RN.   Disposition Initial Assessment Completed for this Encounter: Yes Disposition of Patient: (overnight observation for safety and stabilization. ) Patient refused recommended treatment: No Mode of transportation if patient is discharged?: N/A  On Site Evaluation by: Vertell Novak, MS, LPC, CRC. Reviewed with Physician: Dr. Florina Ou and Lindon Romp, NP.   Vertell Novak 12/30/2017 6:12 AM   Vertell Novak, MS, [facility], [place] Triage Specialist [phone]

## 2017-12-30 NOTE — ED Notes (Signed)
Date and time results received: 12/30/17 0200  Test: Alcohol Critical Value: 441

## 2017-12-30 NOTE — ED Notes (Signed)
ED Provider at bedside. 

## 2017-12-30 NOTE — ED Notes (Signed)
Pt wanted something for withdrawal and suddenly started acting like he was shaking.  A few minutes earlier pt was calm and cooperative without shakes.  Pt stated he needs a drink.  Pt has peer support card but does not want to wait for peer support to see him .  He does not appear in any distress.

## 2017-12-31 DIAGNOSIS — R569 Unspecified convulsions: Secondary | ICD-10-CM

## 2017-12-31 DIAGNOSIS — R109 Unspecified abdominal pain: Secondary | ICD-10-CM | POA: Diagnosis present

## 2017-12-31 DIAGNOSIS — R1010 Upper abdominal pain, unspecified: Secondary | ICD-10-CM

## 2017-12-31 DIAGNOSIS — F10239 Alcohol dependence with withdrawal, unspecified: Secondary | ICD-10-CM

## 2017-12-31 DIAGNOSIS — F10939 Alcohol use, unspecified with withdrawal, unspecified: Secondary | ICD-10-CM

## 2017-12-31 LAB — COMPREHENSIVE METABOLIC PANEL
ALK PHOS: 64 U/L (ref 38–126)
ALT: 49 U/L (ref 17–63)
AST: 68 U/L — AB (ref 15–41)
Albumin: 3.4 g/dL — ABNORMAL LOW (ref 3.5–5.0)
Anion gap: 8 (ref 5–15)
BILIRUBIN TOTAL: 0.7 mg/dL (ref 0.3–1.2)
CALCIUM: 8.2 mg/dL — AB (ref 8.9–10.3)
CO2: 27 mmol/L (ref 22–32)
CREATININE: 0.83 mg/dL (ref 0.61–1.24)
Chloride: 103 mmol/L (ref 101–111)
Glucose, Bld: 80 mg/dL (ref 65–99)
Potassium: 3.5 mmol/L (ref 3.5–5.1)
Sodium: 138 mmol/L (ref 135–145)
TOTAL PROTEIN: 6.4 g/dL — AB (ref 6.5–8.1)

## 2017-12-31 LAB — CBC WITH DIFFERENTIAL/PLATELET
Basophils Absolute: 0 10*3/uL (ref 0.0–0.1)
Basophils Relative: 1 %
EOS ABS: 0.1 10*3/uL (ref 0.0–0.7)
Eosinophils Relative: 2 %
HEMATOCRIT: 38.8 % — AB (ref 39.0–52.0)
Hemoglobin: 12.7 g/dL — ABNORMAL LOW (ref 13.0–17.0)
LYMPHS ABS: 1.3 10*3/uL (ref 0.7–4.0)
Lymphocytes Relative: 49 %
MCH: 29.9 pg (ref 26.0–34.0)
MCHC: 32.7 g/dL (ref 30.0–36.0)
MCV: 91.3 fL (ref 78.0–100.0)
MONO ABS: 0.4 10*3/uL (ref 0.1–1.0)
MONOS PCT: 14 %
NEUTROS ABS: 0.9 10*3/uL — AB (ref 1.7–7.7)
Neutrophils Relative %: 34 %
PLATELETS: 118 10*3/uL — AB (ref 150–400)
RBC: 4.25 MIL/uL (ref 4.22–5.81)
RDW: 15.9 % — AB (ref 11.5–15.5)
WBC: 2.7 10*3/uL — ABNORMAL LOW (ref 4.0–10.5)

## 2017-12-31 LAB — MAGNESIUM: MAGNESIUM: 1.5 mg/dL — AB (ref 1.7–2.4)

## 2017-12-31 LAB — LIPASE, BLOOD: Lipase: 28 U/L (ref 11–51)

## 2017-12-31 MED ORDER — AMLODIPINE BESYLATE 5 MG PO TABS
5.0000 mg | ORAL_TABLET | Freq: Every day | ORAL | Status: DC
  Administered 2017-12-31 – 2018-01-03 (×4): 5 mg via ORAL
  Filled 2017-12-31 (×4): qty 1

## 2017-12-31 MED ORDER — FOLIC ACID 1 MG PO TABS
1.0000 mg | ORAL_TABLET | Freq: Every day | ORAL | Status: DC
  Administered 2017-12-31 – 2018-01-03 (×4): 1 mg via ORAL
  Filled 2017-12-31 (×4): qty 1

## 2017-12-31 MED ORDER — THIAMINE HCL 100 MG/ML IJ SOLN: 100.0000 mg | Freq: Every day | INTRAMUSCULAR | Status: DC

## 2017-12-31 MED ORDER — MAGNESIUM SULFATE 4 GM/100ML IV SOLN
4.0000 g | Freq: Once | INTRAVENOUS | Status: AC
  Administered 2017-12-31: 4 g via INTRAVENOUS
  Filled 2017-12-31: qty 100

## 2017-12-31 MED ORDER — POTASSIUM CHLORIDE IN NACL 40-0.9 MEQ/L-% IV SOLN
INTRAVENOUS | Status: DC
  Administered 2017-12-31: 75 mL/h via INTRAVENOUS
  Filled 2017-12-31: qty 1000

## 2017-12-31 MED ORDER — IBUPROFEN 200 MG PO TABS
600.0000 mg | ORAL_TABLET | Freq: Four times a day (QID) | ORAL | Status: DC | PRN
  Administered 2017-12-31: 600 mg via ORAL
  Filled 2017-12-31: qty 1

## 2017-12-31 MED ORDER — VITAMIN B-1 100 MG PO TABS
100.0000 mg | ORAL_TABLET | Freq: Every day | ORAL | Status: DC
  Administered 2017-12-31 – 2018-01-03 (×4): 100 mg via ORAL
  Filled 2017-12-31 (×4): qty 1

## 2017-12-31 MED ORDER — PANTOPRAZOLE SODIUM 40 MG PO TBEC
40.0000 mg | DELAYED_RELEASE_TABLET | Freq: Every day | ORAL | Status: DC
  Administered 2017-12-31 – 2018-01-03 (×4): 40 mg via ORAL
  Filled 2017-12-31 (×4): qty 1

## 2017-12-31 MED ORDER — LORAZEPAM 2 MG/ML IJ SOLN
0.0000 mg | Freq: Two times a day (BID) | INTRAMUSCULAR | Status: DC
  Administered 2017-12-31 – 2018-01-03 (×2): 1 mg via INTRAVENOUS
  Filled 2017-12-31: qty 1

## 2017-12-31 MED ORDER — LORAZEPAM 1 MG PO TABS
1.0000 mg | ORAL_TABLET | Freq: Four times a day (QID) | ORAL | Status: AC | PRN
  Administered 2017-12-31 – 2018-01-01 (×2): 1 mg via ORAL
  Filled 2017-12-31 (×2): qty 1

## 2017-12-31 MED ORDER — ENOXAPARIN SODIUM 40 MG/0.4ML ~~LOC~~ SOLN: 40.0000 mg | SUBCUTANEOUS | Status: DC

## 2017-12-31 MED ORDER — SODIUM CHLORIDE 0.9 % IV SOLN: INTRAVENOUS | Status: DC

## 2017-12-31 MED ORDER — KCL IN DEXTROSE-NACL 10-5-0.45 MEQ/L-%-% IV SOLN
INTRAVENOUS | Status: DC
  Administered 2017-12-31: 02:00:00 via INTRAVENOUS
  Filled 2017-12-31 (×2): qty 1000

## 2017-12-31 MED ORDER — LORAZEPAM 2 MG/ML IJ SOLN
1.0000 mg | Freq: Four times a day (QID) | INTRAMUSCULAR | Status: AC | PRN
  Administered 2018-01-01: 1 mg via INTRAVENOUS
  Filled 2017-12-31: qty 1

## 2017-12-31 MED ORDER — LORAZEPAM 2 MG/ML IJ SOLN
0.0000 mg | Freq: Four times a day (QID) | INTRAMUSCULAR | Status: AC
  Filled 2017-12-31: qty 1

## 2017-12-31 MED ORDER — ZIPRASIDONE HCL 20 MG PO CAPS
20.0000 mg | ORAL_CAPSULE | Freq: Every day | ORAL | Status: DC
  Administered 2017-12-31 – 2018-01-03 (×4): 20 mg via ORAL
  Filled 2017-12-31 (×4): qty 1

## 2017-12-31 MED ORDER — QUETIAPINE FUMARATE 50 MG PO TABS
100.0000 mg | ORAL_TABLET | Freq: Every day | ORAL | Status: DC
  Administered 2017-12-31 – 2018-01-01 (×3): 100 mg via ORAL
  Filled 2017-12-31 (×2): qty 2
  Filled 2017-12-31: qty 1

## 2017-12-31 MED ORDER — ADULT MULTIVITAMIN W/MINERALS CH
1.0000 | ORAL_TABLET | Freq: Every day | ORAL | Status: DC
  Administered 2017-12-31 – 2018-01-03 (×4): 1 via ORAL
  Filled 2017-12-31 (×4): qty 1

## 2017-12-31 MED ORDER — BUSPIRONE HCL 10 MG PO TABS
10.0000 mg | ORAL_TABLET | Freq: Three times a day (TID) | ORAL | Status: DC
  Administered 2017-12-31 – 2018-01-03 (×11): 10 mg via ORAL
  Filled 2017-12-31 (×12): qty 1

## 2017-12-31 NOTE — Progress Notes (Signed)
PROGRESS NOTE    [name]  SHF:[id number] DOB: 1981/02/09 DOA: 12/30/2017 PCP: [facility]    Brief Narrative:  [name] is a 37 y.o. male with medical history significant for schizophrenia, alcohol dependence, alcohol withdrawal seizure, cirrhosis, htn, pancreatitis, breast cancer s/p mastectomy, cocaine abuse, thrombocytopenia, presenting with seizures.  Multiple recent admissions: two this month for alcohol detox, notable for etoh withdrawal seizure. Also admit in April and January for suicidal ideation. Presented to ED with seizure at home. Says was witnessed by one of his roommates. Bit his lip in the process. Denied SI but SI is reported in history obtained by ED provider. Says usually drinks 1/2 to 1 gallon of hard alcohol a day, most recently a few hours prior to arrival. Says unsure if has been diagnosed with epilepsy. Is not on an anti-epileptic. Desires detox. Denied inury other than to lip. No chest pain.  ED Course: bh eval, labs, CT head/c-spine      Assessment & Plan:   Principal Problem:   Alcohol withdrawal seizure without complication (HCC) Active Problems:   Essential hypertension   Elevated LFTs   Alcohol abuse   Episode of recurrent major depressive disorder (HCC)   Seizure ([place])   Suicidal ideation   Alcohol withdrawal (HCC)   Alcohol withdrawal seizure ([place])   Abdominal pain   #1 alcohol withdrawal seizure without complication/alcohol withdrawal/alcohol abuse Patient presented with alcohol withdrawal seizures.  Patient admitted to telemetry bed.  No seizures noted overnight.  Alcohol level on admission was 441 down to 338.  Salicylate level was less than 7.  Tylenol level is less than 10.  Salicylate level less than 7.  UDS was negative.  Patient more alert.  Alcohol cessation was stressed to patient again.  Continue the Ativan withdrawal protocol, thiamine, folic acid, multivitamin.  Saline lock IV fluids.  Advance diet  to regular diet as tolerated.  2.  Transaminitis Likely secondary to fatty liver as noted on CT abdomen and pelvis of 10/20/2017.  Acute hepatitis panel was checked on 12/13/2017 which was negative.  Repeat LFTs in the morning.  Saline lock IV fluids.  Follow.  3.  Hypertension Stable.  Continue current regimen of Norvasc 5 mg daily.  Follow.  4.  Hypomagnesemia Replete.  5.  Upper abdominal pain Likely secondary to gastritis.  Lipase levels within normal limits.  Place on a PPI.  Pain management.  Supportive care.   DVT prophylaxis: SCDs Code Status: Full Family Communication: Updated patient.  No family at bedside. Disposition Plan: Home once Ativan withdrawal protocol has been completed.   Consultants:   None  Procedures:   CXR 12/30/2017  CT head, CT C-spine, CT maxillofacial 12/30/2017  Antimicrobials:   None   Subjective: Sleeping however easily arousable.  Denies any chest pain no shortness of breath.  Tolerating clear liquids.  Some complaints of upper abdominal discomfort.  No nausea or emesis.  Objective: Vitals:   12/31/17 0131 12/31/17 0248 12/31/17 0302 12/31/17 0733  BP: (!) 139/97  (!) 136/102 140/80  Pulse: 85  88 96  Resp: 18 18 20 18   Temp: 98.7 F (37.1 C)  98.1 F (36.7 C) 98.3 F (36.8 C)  TempSrc: Oral  Oral Oral  SpO2: 96%  99% 99%  Weight:  65.8 kg (145 lb) 67.3 kg (148 lb 5.9 oz)   Height:  5\' 6"  (1.676 m) 5\' 6"  (1.676 m)     Intake/Output Summary (Last 24 hours) at 12/31/2017 1124  Last data filed at 12/31/2017 0400 Gross per 24 hour  Intake 2176.25 ml  Output -  Net 2176.25 ml   Filed Weights   12/31/17 0248 12/31/17 0302  Weight: 65.8 kg (145 lb) 67.3 kg (148 lb 5.9 oz)    Examination:  General exam: Tremors noted. Respiratory system: Clear to auscultation. Respiratory effort normal. Cardiovascular system: S1 & S2 heard, RRR. No JVD, murmurs, rubs, gallops or clicks. No pedal edema. Gastrointestinal system: Abdomen is soft,  some tenderness to palpation in the epigastrium and upper abdominal region bilaterally.  Positive bowel sounds.  No rebound.  No guarding.  Nondistended.  Central nervous system: Alert and oriented. No focal neurological deficits. Extremities: Symmetric 5 x 5 power. Skin: No rashes, lesions or ulcers Psychiatry: Judgement and insight appear normal. Mood & affect appropriate.     Data Reviewed: I have personally reviewed following labs and imaging studies  CBC: Recent Labs  Lab 12/30/17 0009 12/30/17 1613 12/31/17 0844  WBC 3.9* 3.3* 2.7*  NEUTROABS 1.4*  --  0.9*  HGB 13.7 13.9 12.7*  HCT 40.8 42.2 38.8*  MCV 91.1 90.9 91.3  PLT 157 169 500*   Basic Metabolic Panel: Recent Labs  Lab 12/30/17 0009 12/30/17 1613 12/31/17 0844  NA 146* 142 138  K 4.0 3.9 3.5  CL 107 105 103  CO2 28 24 27   GLUCOSE 89 164* 80  BUN 11 6 <5*  CREATININE 0.78 0.87 0.83  CALCIUM 8.3* 8.6* 8.2*  MG  --   --  1.5*   GFR: Estimated Creatinine Clearance: 108.9 mL/min (by C-G formula based on SCr of 0.83 mg/dL). Liver Function Tests: Recent Labs  Lab 12/30/17 0009 12/30/17 1613 12/31/17 0844  AST 87* 87* 68*  ALT 65* 63 49  ALKPHOS 71 71 64  BILITOT 0.3 0.2* 0.7  PROT 7.2 7.1 6.4*  ALBUMIN 3.7 3.7 3.4*   No results for input(s): LIPASE, AMYLASE in the last 168 hours. No results for input(s): AMMONIA in the last 168 hours. Coagulation Profile: No results for input(s): INR, PROTIME in the last 168 hours. Cardiac Enzymes: Recent Labs  Lab 12/30/17 1613  TROPONINI <0.03   BNP (last 3 results) No results for input(s): PROBNP in the last 8760 hours. HbA1C: No results for input(s): HGBA1C in the last 72 hours. CBG: No results for input(s): GLUCAP in the last 168 hours. Lipid Profile: No results for input(s): CHOL, HDL, LDLCALC, TRIG, CHOLHDL, LDLDIRECT in the last 72 hours. Thyroid Function Tests: No results for input(s): TSH, T4TOTAL, FREET4, T3FREE, THYROIDAB in the last 72  hours. Anemia Panel: No results for input(s): VITAMINB12, FOLATE, FERRITIN, TIBC, IRON, RETICCTPCT in the last 72 hours. Sepsis Labs: No results for input(s): PROCALCITON, LATICACIDVEN in the last 168 hours.  No results found for this or any previous visit (from the past 240 hour(s)).       Radiology Studies: Dg Chest 2 View  Result Date: 12/30/2017 CLINICAL DATA:  Seizure today. EXAM: CHEST - 2 VIEW COMPARISON:  Chest x-ray dated 12/22/2017. FINDINGS: The heart size and mediastinal contours are within normal limits. Both lungs are clear. The visualized skeletal structures are unremarkable. IMPRESSION: No active cardiopulmonary disease. Electronically Signed   By: Franki Cabot M.D.   On: 12/30/2017 17:36   Ct Head Wo Contrast  Result Date: 12/30/2017 CLINICAL DATA:  Injuries from seizure, RIGHT cheek and bottom lip are swollen, tachycardia, ETOH. EXAM: CT HEAD WITHOUT CONTRAST CT MAXILLOFACIAL WITHOUT CONTRAST CT CERVICAL SPINE WITHOUT CONTRAST TECHNIQUE:  Multidetector CT imaging of the head, cervical spine, and maxillofacial structures were performed using the standard protocol without intravenous contrast. Multiplanar CT image reconstructions of the cervical spine and maxillofacial structures were also generated. COMPARISON:  Head CT dated 12/12/2017. FINDINGS: CT HEAD FINDINGS Brain: Ventricles are stable in size and configuration. There is no mass, hemorrhage, edema or other evidence of acute parenchymal abnormality. No extra-axial hemorrhage. Vascular: No hyperdense vessel or unexpected calcification. Skull: Normal. Negative for fracture or focal lesion. Other: None. CT MAXILLOFACIAL FINDINGS Osseous: Lower frontal bones are intact and normally aligned. No displaced nasal bone fracture seen. Osseous structures about the orbits are intact and normally aligned. Walls of the maxillary sinuses are intact and normally aligned. Bilateral zygomatic arches and pterygoid plates are intact. No  mandible fracture or displacement seen. Multiple missing teeth. Lucencies at the bases of multiple remaining maxillary and mandibular teeth raise the possibility of periodontal abscesses. Orbits: Negative. No traumatic or inflammatory finding. Sinuses: Clear. Soft tissues: Ill-defined edema within the subcutaneous soft tissues overlying the maxilla and mandible, bilateral, most prominent along the anterior margin of the lower mandible. No circumscribed soft tissue hematoma seen. CT CERVICAL SPINE FINDINGS Alignment: Mild levoscoliosis. No evidence of acute vertebral body subluxation. Skull base and vertebrae: No fracture line or displaced fracture fragment. Facet joints appear intact and normally aligned throughout. Soft tissues and spinal canal: No prevertebral fluid or swelling. No visible canal hematoma. Disc levels: Mild disc desiccations at multiple levels, but no more than mild central canal stenosis at any level. Upper chest: Presumed soft tissue edema within the subcutaneous soft tissues of the upper chest Other: None. IMPRESSION: 1. No acute intracranial abnormality. No intracranial hemorrhage or edema. No skull fracture. 2. No facial bone fracture or dislocation. Lucencies at the bases of multiple maxillary and mandibular teeth raising the possibility of periodontal abscesses. 3. Ill-defined edema within the subcutaneous soft tissues overlying the bilateral maxilla and mandible, most prominent overlying the anterior margin of the lower mandible, without circumscribed hematoma. 4. No fracture or acute subluxation within the cervical spine. 5. Soft tissue thickening/nodularity within the subcutaneous soft tissues of the upper chest, similar soft tissue mass/nodularity demonstrated on earlier chest CT of 06/02/2015 suggesting benignity, considerations including superficial varices or postsurgical change. Electronically Signed   By: Franki Cabot M.D.   On: 12/30/2017 17:35   Ct Cervical Spine Wo  Contrast  Result Date: 12/30/2017 CLINICAL DATA:  Injuries from seizure, RIGHT cheek and bottom lip are swollen, tachycardia, ETOH. EXAM: CT HEAD WITHOUT CONTRAST CT MAXILLOFACIAL WITHOUT CONTRAST CT CERVICAL SPINE WITHOUT CONTRAST TECHNIQUE: Multidetector CT imaging of the head, cervical spine, and maxillofacial structures were performed using the standard protocol without intravenous contrast. Multiplanar CT image reconstructions of the cervical spine and maxillofacial structures were also generated. COMPARISON:  Head CT dated 12/12/2017. FINDINGS: CT HEAD FINDINGS Brain: Ventricles are stable in size and configuration. There is no mass, hemorrhage, edema or other evidence of acute parenchymal abnormality. No extra-axial hemorrhage. Vascular: No hyperdense vessel or unexpected calcification. Skull: Normal. Negative for fracture or focal lesion. Other: None. CT MAXILLOFACIAL FINDINGS Osseous: Lower frontal bones are intact and normally aligned. No displaced nasal bone fracture seen. Osseous structures about the orbits are intact and normally aligned. Walls of the maxillary sinuses are intact and normally aligned. Bilateral zygomatic arches and pterygoid plates are intact. No mandible fracture or displacement seen. Multiple missing teeth. Lucencies at the bases of multiple remaining maxillary and mandibular teeth raise the possibility of periodontal  abscesses. Orbits: Negative. No traumatic or inflammatory finding. Sinuses: Clear. Soft tissues: Ill-defined edema within the subcutaneous soft tissues overlying the maxilla and mandible, bilateral, most prominent along the anterior margin of the lower mandible. No circumscribed soft tissue hematoma seen. CT CERVICAL SPINE FINDINGS Alignment: Mild levoscoliosis. No evidence of acute vertebral body subluxation. Skull base and vertebrae: No fracture line or displaced fracture fragment. Facet joints appear intact and normally aligned throughout. Soft tissues and spinal  canal: No prevertebral fluid or swelling. No visible canal hematoma. Disc levels: Mild disc desiccations at multiple levels, but no more than mild central canal stenosis at any level. Upper chest: Presumed soft tissue edema within the subcutaneous soft tissues of the upper chest Other: None. IMPRESSION: 1. No acute intracranial abnormality. No intracranial hemorrhage or edema. No skull fracture. 2. No facial bone fracture or dislocation. Lucencies at the bases of multiple maxillary and mandibular teeth raising the possibility of periodontal abscesses. 3. Ill-defined edema within the subcutaneous soft tissues overlying the bilateral maxilla and mandible, most prominent overlying the anterior margin of the lower mandible, without circumscribed hematoma. 4. No fracture or acute subluxation within the cervical spine. 5. Soft tissue thickening/nodularity within the subcutaneous soft tissues of the upper chest, similar soft tissue mass/nodularity demonstrated on earlier chest CT of 06/02/2015 suggesting benignity, considerations including superficial varices or postsurgical change. Electronically Signed   By: Franki Cabot M.D.   On: 12/30/2017 17:35   Ct Maxillofacial Wo Contrast  Result Date: 12/30/2017 CLINICAL DATA:  Injuries from seizure, RIGHT cheek and bottom lip are swollen, tachycardia, ETOH. EXAM: CT HEAD WITHOUT CONTRAST CT MAXILLOFACIAL WITHOUT CONTRAST CT CERVICAL SPINE WITHOUT CONTRAST TECHNIQUE: Multidetector CT imaging of the head, cervical spine, and maxillofacial structures were performed using the standard protocol without intravenous contrast. Multiplanar CT image reconstructions of the cervical spine and maxillofacial structures were also generated. COMPARISON:  Head CT dated 12/12/2017. FINDINGS: CT HEAD FINDINGS Brain: Ventricles are stable in size and configuration. There is no mass, hemorrhage, edema or other evidence of acute parenchymal abnormality. No extra-axial hemorrhage. Vascular: No  hyperdense vessel or unexpected calcification. Skull: Normal. Negative for fracture or focal lesion. Other: None. CT MAXILLOFACIAL FINDINGS Osseous: Lower frontal bones are intact and normally aligned. No displaced nasal bone fracture seen. Osseous structures about the orbits are intact and normally aligned. Walls of the maxillary sinuses are intact and normally aligned. Bilateral zygomatic arches and pterygoid plates are intact. No mandible fracture or displacement seen. Multiple missing teeth. Lucencies at the bases of multiple remaining maxillary and mandibular teeth raise the possibility of periodontal abscesses. Orbits: Negative. No traumatic or inflammatory finding. Sinuses: Clear. Soft tissues: Ill-defined edema within the subcutaneous soft tissues overlying the maxilla and mandible, bilateral, most prominent along the anterior margin of the lower mandible. No circumscribed soft tissue hematoma seen. CT CERVICAL SPINE FINDINGS Alignment: Mild levoscoliosis. No evidence of acute vertebral body subluxation. Skull base and vertebrae: No fracture line or displaced fracture fragment. Facet joints appear intact and normally aligned throughout. Soft tissues and spinal canal: No prevertebral fluid or swelling. No visible canal hematoma. Disc levels: Mild disc desiccations at multiple levels, but no more than mild central canal stenosis at any level. Upper chest: Presumed soft tissue edema within the subcutaneous soft tissues of the upper chest Other: None. IMPRESSION: 1. No acute intracranial abnormality. No intracranial hemorrhage or edema. No skull fracture. 2. No facial bone fracture or dislocation. Lucencies at the bases of multiple maxillary and mandibular teeth raising the  possibility of periodontal abscesses. 3. Ill-defined edema within the subcutaneous soft tissues overlying the bilateral maxilla and mandible, most prominent overlying the anterior margin of the lower mandible, without circumscribed hematoma.  4. No fracture or acute subluxation within the cervical spine. 5. Soft tissue thickening/nodularity within the subcutaneous soft tissues of the upper chest, similar soft tissue mass/nodularity demonstrated on earlier chest CT of 06/02/2015 suggesting benignity, considerations including superficial varices or postsurgical change. Electronically Signed   By: Franki Cabot M.D.   On: 12/30/2017 17:35        Scheduled Meds: . amLODipine  5 mg Oral Daily  . busPIRone  10 mg Oral TID  . folic acid  1 mg Oral Daily  . LORazepam  0-4 mg Intravenous Q6H   Followed by  . [START ON 01/02/2018] LORazepam  0-4 mg Intravenous Q12H  . multivitamin with minerals  1 tablet Oral Daily  . QUEtiapine  100 mg Oral QHS  . thiamine  100 mg Oral Daily   Or  . thiamine  100 mg Intravenous Daily  . ziprasidone  20 mg Oral Daily   Continuous Infusions: . magnesium sulfate 1 - 4 g bolus IVPB    . sodium chloride 0.9 % 1,000 mL with potassium chloride 40 mEq infusion       LOS: 0 days    Time spent: 35 minutes    Irine Seal, MD [facility] Pager [phone]  If 7PM-7AM, please contact night-coverage [email or web address] Password [facility] 12/31/2017, 11:24 AM

## 2017-12-31 NOTE — ED Notes (Signed)
Wouk, MD paged for CP 6/10. MD gave verbal for ibuprofen. Will continue to monitor.

## 2018-01-01 LAB — MAGNESIUM: Magnesium: 1.9 mg/dL (ref 1.7–2.4)

## 2018-01-01 LAB — COMPREHENSIVE METABOLIC PANEL
ALT: 51 U/L (ref 17–63)
ANION GAP: 8 (ref 5–15)
AST: 80 U/L — AB (ref 15–41)
Albumin: 3.7 g/dL (ref 3.5–5.0)
Alkaline Phosphatase: 75 U/L (ref 38–126)
BILIRUBIN TOTAL: 0.9 mg/dL (ref 0.3–1.2)
BUN: 5 mg/dL — AB (ref 6–20)
CO2: 26 mmol/L (ref 22–32)
Calcium: 9.4 mg/dL (ref 8.9–10.3)
Chloride: 102 mmol/L (ref 101–111)
Creatinine, Ser: 0.85 mg/dL (ref 0.61–1.24)
Glucose, Bld: 78 mg/dL (ref 65–99)
POTASSIUM: 4.7 mmol/L (ref 3.5–5.1)
Sodium: 136 mmol/L (ref 135–145)
TOTAL PROTEIN: 7.2 g/dL (ref 6.5–8.1)

## 2018-01-01 LAB — CBC
HCT: 45.7 % (ref 39.0–52.0)
Hemoglobin: 15.5 g/dL (ref 13.0–17.0)
MCH: 30.2 pg (ref 26.0–34.0)
MCHC: 33.9 g/dL (ref 30.0–36.0)
MCV: 88.9 fL (ref 78.0–100.0)
PLATELETS: 135 10*3/uL — AB (ref 150–400)
RBC: 5.14 MIL/uL (ref 4.22–5.81)
RDW: 15.4 % (ref 11.5–15.5)
WBC: 3.4 10*3/uL — AB (ref 4.0–10.5)

## 2018-01-01 MED ORDER — TRAZODONE HCL 50 MG PO TABS
50.0000 mg | ORAL_TABLET | Freq: Once | ORAL | Status: AC
  Administered 2018-01-01: 50 mg via ORAL
  Filled 2018-01-01: qty 1

## 2018-01-01 NOTE — Progress Notes (Signed)
PROGRESS NOTE    [name]  ZES:[id number] DOB: 10/14/1980 DOA: 12/30/2017 PCP: Inc, [facility]    Brief Narrative:  [name] is a 37 y.o. male with medical history significant for schizophrenia, alcohol dependence, alcohol withdrawal seizure, cirrhosis, htn, pancreatitis, breast cancer s/p mastectomy, cocaine abuse, thrombocytopenia, presenting with seizures.  Multiple recent admissions: two this month for alcohol detox, notable for etoh withdrawal seizure. Also admit in April and January for suicidal ideation. Presented to ED with seizure at home. Says was witnessed by one of his roommates. Bit his lip in the process. Denied SI but SI is reported in history obtained by ED provider. Says usually drinks 1/2 to 1 gallon of hard alcohol a day, most recently a few hours prior to arrival. Says unsure if has been diagnosed with epilepsy. Is not on an anti-epileptic. Desires detox. Denied inury other than to lip. No chest pain.  ED Course: bh eval, labs, CT head/c-spine      Assessment & Plan:   Principal Problem:   Alcohol withdrawal seizure without complication (HCC) Active Problems:   Essential hypertension   Elevated LFTs   Alcohol abuse   Episode of recurrent major depressive disorder (HCC)   Seizure ([place])   Suicidal ideation   Alcohol withdrawal (HCC)   Alcohol withdrawal seizure ([place])   Abdominal pain   #1 alcohol withdrawal seizure without complication/alcohol withdrawal/alcohol abuse Patient presented with alcohol withdrawal seizures.  Patient admitted to telemetry bed.  No seizures noted overnight.  Alcohol level on admission was 441 down to 338.  Salicylate level was less than 7.  Tylenol level is less than 10.  Salicylate level less than 7.  UDS was negative.  Patient more alert.  Alcohol cessation was stressed to patient again.  Continue the Ativan withdrawal protocol, thiamine, folic acid, multivitamin.  Tolerating current diet.    2.   Transaminitis Likely secondary to fatty liver as noted on CT abdomen and pelvis of 10/20/2017.  Acute hepatitis panel was checked on 12/13/2017 which was negative.  IVF been saline locked.  Outpatient follow-up.   3.  Hypertension Stable.  Continue Norvasc.   4.  Hypomagnesemia Replete.  5.  Upper abdominal pain Likely secondary to gastritis.  Lipase levels within normal limits.  Improved clinically.  Continue PPI.  Supportive care.    DVT prophylaxis: SCDs Code Status: Full Family Communication: Updated patient.  No family at bedside. Disposition Plan: Home once Ativan withdrawal protocol has been completed.   Consultants:   None  Procedures:   CXR 12/30/2017  CT head, CT C-spine, CT maxillofacial 12/30/2017  Antimicrobials:   None   Subjective: Easily arousable.  No chest pain.  No shortness of breath.  Abdominal pain improved.  Tolerating current diet.   Objective: Vitals:   12/31/17 2339 01/01/18 0349 01/01/18 0811 01/01/18 1110  BP: (!) 147/109 (!) 146/106 136/90 (!) 144/118  Pulse: 87 99 86 (!) 103  Resp: 18 18 16 16   Temp: 97.9 F (36.6 C) 98 F (36.7 C) 98.1 F (36.7 C) 98 F (36.7 C)  TempSrc: Oral Oral Oral Oral  SpO2: 99% 98% 99% 100%  Weight:      Height:        Intake/Output Summary (Last 24 hours) at 01/01/2018 1341 Last data filed at 12/31/2017 1726 Gross per 24 hour  Intake -  Output 800 ml  Net -800 ml   Filed Weights   12/31/17 0248 12/31/17 0302  Weight: 65.8 kg (145 lb)  67.3 kg (148 lb 5.9 oz)    Examination:  General exam: Tremors noted. Respiratory system: CTA B.  No wheezes, no crackles, no rhonchi.  Normal respiratory effort.   Cardiovascular system: Regular rate and rhythm no murmurs rubs or gallops.  No JVD.  No lower extremity edema.  Gastrointestinal system: Abdomen is nontender, nondistended, soft, positive bowel sounds.  No rebound.  No guarding. Central nervous system: Alert and oriented. No focal neurological  deficits. Extremities: Symmetric 5 x 5 power. Skin: No rashes, lesions or ulcers Psychiatry: Judgement and insight appear normal. Mood & affect appropriate.     Data Reviewed: I have personally reviewed following labs and imaging studies  CBC: Recent Labs  Lab 12/30/17 0009 12/30/17 1613 12/31/17 0844 01/01/18 0955  WBC 3.9* 3.3* 2.7* 3.4*  NEUTROABS 1.4*  --  0.9*  --   HGB 13.7 13.9 12.7* 15.5  HCT 40.8 42.2 38.8* 45.7  MCV 91.1 90.9 91.3 88.9  PLT 157 169 118* 469*   Basic Metabolic Panel: Recent Labs  Lab 12/30/17 0009 12/30/17 1613 12/31/17 0844 01/01/18 0955  NA 146* 142 138 136  K 4.0 3.9 3.5 4.7  CL 107 105 103 102  CO2 28 24 27 26   GLUCOSE 89 164* 80 78  BUN 11 6 <5* 5*  CREATININE 0.78 0.87 0.83 0.85  CALCIUM 8.3* 8.6* 8.2* 9.4  MG  --   --  1.5* 1.9   GFR: Estimated Creatinine Clearance: 106.3 mL/min (by C-G formula based on SCr of 0.85 mg/dL). Liver Function Tests: Recent Labs  Lab 12/30/17 0009 12/30/17 1613 12/31/17 0844 01/01/18 0955  AST 87* 87* 68* 80*  ALT 65* 63 49 51  ALKPHOS 71 71 64 75  BILITOT 0.3 0.2* 0.7 0.9  PROT 7.2 7.1 6.4* 7.2  ALBUMIN 3.7 3.7 3.4* 3.7   Recent Labs  Lab 12/31/17 0844  LIPASE 28   No results for input(s): AMMONIA in the last 168 hours. Coagulation Profile: No results for input(s): INR, PROTIME in the last 168 hours. Cardiac Enzymes: Recent Labs  Lab 12/30/17 1613  TROPONINI <0.03   BNP (last 3 results) No results for input(s): PROBNP in the last 8760 hours. HbA1C: No results for input(s): HGBA1C in the last 72 hours. CBG: No results for input(s): GLUCAP in the last 168 hours. Lipid Profile: No results for input(s): CHOL, HDL, LDLCALC, TRIG, CHOLHDL, LDLDIRECT in the last 72 hours. Thyroid Function Tests: No results for input(s): TSH, T4TOTAL, FREET4, T3FREE, THYROIDAB in the last 72 hours. Anemia Panel: No results for input(s): VITAMINB12, FOLATE, FERRITIN, TIBC, IRON, RETICCTPCT in the last  72 hours. Sepsis Labs: No results for input(s): PROCALCITON, LATICACIDVEN in the last 168 hours.  No results found for this or any previous visit (from the past 240 hour(s)).       Radiology Studies: Dg Chest 2 View  Result Date: 12/30/2017 CLINICAL DATA:  Seizure today. EXAM: CHEST - 2 VIEW COMPARISON:  Chest x-ray dated 12/22/2017. FINDINGS: The heart size and mediastinal contours are within normal limits. Both lungs are clear. The visualized skeletal structures are unremarkable. IMPRESSION: No active cardiopulmonary disease. Electronically Signed   By: Franki Cabot M.D.   On: 12/30/2017 17:36   Ct Head Wo Contrast  Result Date: 12/30/2017 CLINICAL DATA:  Injuries from seizure, RIGHT cheek and bottom lip are swollen, tachycardia, ETOH. EXAM: CT HEAD WITHOUT CONTRAST CT MAXILLOFACIAL WITHOUT CONTRAST CT CERVICAL SPINE WITHOUT CONTRAST TECHNIQUE: Multidetector CT imaging of the head, cervical spine, and maxillofacial  structures were performed using the standard protocol without intravenous contrast. Multiplanar CT image reconstructions of the cervical spine and maxillofacial structures were also generated. COMPARISON:  Head CT dated 12/12/2017. FINDINGS: CT HEAD FINDINGS Brain: Ventricles are stable in size and configuration. There is no mass, hemorrhage, edema or other evidence of acute parenchymal abnormality. No extra-axial hemorrhage. Vascular: No hyperdense vessel or unexpected calcification. Skull: Normal. Negative for fracture or focal lesion. Other: None. CT MAXILLOFACIAL FINDINGS Osseous: Lower frontal bones are intact and normally aligned. No displaced nasal bone fracture seen. Osseous structures about the orbits are intact and normally aligned. Walls of the maxillary sinuses are intact and normally aligned. Bilateral zygomatic arches and pterygoid plates are intact. No mandible fracture or displacement seen. Multiple missing teeth. Lucencies at the bases of multiple remaining maxillary  and mandibular teeth raise the possibility of periodontal abscesses. Orbits: Negative. No traumatic or inflammatory finding. Sinuses: Clear. Soft tissues: Ill-defined edema within the subcutaneous soft tissues overlying the maxilla and mandible, bilateral, most prominent along the anterior margin of the lower mandible. No circumscribed soft tissue hematoma seen. CT CERVICAL SPINE FINDINGS Alignment: Mild levoscoliosis. No evidence of acute vertebral body subluxation. Skull base and vertebrae: No fracture line or displaced fracture fragment. Facet joints appear intact and normally aligned throughout. Soft tissues and spinal canal: No prevertebral fluid or swelling. No visible canal hematoma. Disc levels: Mild disc desiccations at multiple levels, but no more than mild central canal stenosis at any level. Upper chest: Presumed soft tissue edema within the subcutaneous soft tissues of the upper chest Other: None. IMPRESSION: 1. No acute intracranial abnormality. No intracranial hemorrhage or edema. No skull fracture. 2. No facial bone fracture or dislocation. Lucencies at the bases of multiple maxillary and mandibular teeth raising the possibility of periodontal abscesses. 3. Ill-defined edema within the subcutaneous soft tissues overlying the bilateral maxilla and mandible, most prominent overlying the anterior margin of the lower mandible, without circumscribed hematoma. 4. No fracture or acute subluxation within the cervical spine. 5. Soft tissue thickening/nodularity within the subcutaneous soft tissues of the upper chest, similar soft tissue mass/nodularity demonstrated on earlier chest CT of 06/02/2015 suggesting benignity, considerations including superficial varices or postsurgical change. Electronically Signed   By: Franki Cabot M.D.   On: 12/30/2017 17:35   Ct Cervical Spine Wo Contrast  Result Date: 12/30/2017 CLINICAL DATA:  Injuries from seizure, RIGHT cheek and bottom lip are swollen, tachycardia,  ETOH. EXAM: CT HEAD WITHOUT CONTRAST CT MAXILLOFACIAL WITHOUT CONTRAST CT CERVICAL SPINE WITHOUT CONTRAST TECHNIQUE: Multidetector CT imaging of the head, cervical spine, and maxillofacial structures were performed using the standard protocol without intravenous contrast. Multiplanar CT image reconstructions of the cervical spine and maxillofacial structures were also generated. COMPARISON:  Head CT dated 12/12/2017. FINDINGS: CT HEAD FINDINGS Brain: Ventricles are stable in size and configuration. There is no mass, hemorrhage, edema or other evidence of acute parenchymal abnormality. No extra-axial hemorrhage. Vascular: No hyperdense vessel or unexpected calcification. Skull: Normal. Negative for fracture or focal lesion. Other: None. CT MAXILLOFACIAL FINDINGS Osseous: Lower frontal bones are intact and normally aligned. No displaced nasal bone fracture seen. Osseous structures about the orbits are intact and normally aligned. Walls of the maxillary sinuses are intact and normally aligned. Bilateral zygomatic arches and pterygoid plates are intact. No mandible fracture or displacement seen. Multiple missing teeth. Lucencies at the bases of multiple remaining maxillary and mandibular teeth raise the possibility of periodontal abscesses. Orbits: Negative. No traumatic or inflammatory finding. Sinuses: Clear.  Soft tissues: Ill-defined edema within the subcutaneous soft tissues overlying the maxilla and mandible, bilateral, most prominent along the anterior margin of the lower mandible. No circumscribed soft tissue hematoma seen. CT CERVICAL SPINE FINDINGS Alignment: Mild levoscoliosis. No evidence of acute vertebral body subluxation. Skull base and vertebrae: No fracture line or displaced fracture fragment. Facet joints appear intact and normally aligned throughout. Soft tissues and spinal canal: No prevertebral fluid or swelling. No visible canal hematoma. Disc levels: Mild disc desiccations at multiple levels, but  no more than mild central canal stenosis at any level. Upper chest: Presumed soft tissue edema within the subcutaneous soft tissues of the upper chest Other: None. IMPRESSION: 1. No acute intracranial abnormality. No intracranial hemorrhage or edema. No skull fracture. 2. No facial bone fracture or dislocation. Lucencies at the bases of multiple maxillary and mandibular teeth raising the possibility of periodontal abscesses. 3. Ill-defined edema within the subcutaneous soft tissues overlying the bilateral maxilla and mandible, most prominent overlying the anterior margin of the lower mandible, without circumscribed hematoma. 4. No fracture or acute subluxation within the cervical spine. 5. Soft tissue thickening/nodularity within the subcutaneous soft tissues of the upper chest, similar soft tissue mass/nodularity demonstrated on earlier chest CT of 06/02/2015 suggesting benignity, considerations including superficial varices or postsurgical change. Electronically Signed   By: Franki Cabot M.D.   On: 12/30/2017 17:35   Ct Maxillofacial Wo Contrast  Result Date: 12/30/2017 CLINICAL DATA:  Injuries from seizure, RIGHT cheek and bottom lip are swollen, tachycardia, ETOH. EXAM: CT HEAD WITHOUT CONTRAST CT MAXILLOFACIAL WITHOUT CONTRAST CT CERVICAL SPINE WITHOUT CONTRAST TECHNIQUE: Multidetector CT imaging of the head, cervical spine, and maxillofacial structures were performed using the standard protocol without intravenous contrast. Multiplanar CT image reconstructions of the cervical spine and maxillofacial structures were also generated. COMPARISON:  Head CT dated 12/12/2017. FINDINGS: CT HEAD FINDINGS Brain: Ventricles are stable in size and configuration. There is no mass, hemorrhage, edema or other evidence of acute parenchymal abnormality. No extra-axial hemorrhage. Vascular: No hyperdense vessel or unexpected calcification. Skull: Normal. Negative for fracture or focal lesion. Other: None. CT MAXILLOFACIAL  FINDINGS Osseous: Lower frontal bones are intact and normally aligned. No displaced nasal bone fracture seen. Osseous structures about the orbits are intact and normally aligned. Walls of the maxillary sinuses are intact and normally aligned. Bilateral zygomatic arches and pterygoid plates are intact. No mandible fracture or displacement seen. Multiple missing teeth. Lucencies at the bases of multiple remaining maxillary and mandibular teeth raise the possibility of periodontal abscesses. Orbits: Negative. No traumatic or inflammatory finding. Sinuses: Clear. Soft tissues: Ill-defined edema within the subcutaneous soft tissues overlying the maxilla and mandible, bilateral, most prominent along the anterior margin of the lower mandible. No circumscribed soft tissue hematoma seen. CT CERVICAL SPINE FINDINGS Alignment: Mild levoscoliosis. No evidence of acute vertebral body subluxation. Skull base and vertebrae: No fracture line or displaced fracture fragment. Facet joints appear intact and normally aligned throughout. Soft tissues and spinal canal: No prevertebral fluid or swelling. No visible canal hematoma. Disc levels: Mild disc desiccations at multiple levels, but no more than mild central canal stenosis at any level. Upper chest: Presumed soft tissue edema within the subcutaneous soft tissues of the upper chest Other: None. IMPRESSION: 1. No acute intracranial abnormality. No intracranial hemorrhage or edema. No skull fracture. 2. No facial bone fracture or dislocation. Lucencies at the bases of multiple maxillary and mandibular teeth raising the possibility of periodontal abscesses. 3. Ill-defined edema within the subcutaneous  soft tissues overlying the bilateral maxilla and mandible, most prominent overlying the anterior margin of the lower mandible, without circumscribed hematoma. 4. No fracture or acute subluxation within the cervical spine. 5. Soft tissue thickening/nodularity within the subcutaneous soft  tissues of the upper chest, similar soft tissue mass/nodularity demonstrated on earlier chest CT of 06/02/2015 suggesting benignity, considerations including superficial varices or postsurgical change. Electronically Signed   By: Franki Cabot M.D.   On: 12/30/2017 17:35        Scheduled Meds: . amLODipine  5 mg Oral Daily  . busPIRone  10 mg Oral TID  . folic acid  1 mg Oral Daily  . LORazepam  0-4 mg Intravenous Q6H   Followed by  . [START ON 01/02/2018] LORazepam  0-4 mg Intravenous Q12H  . multivitamin with minerals  1 tablet Oral Daily  . pantoprazole  40 mg Oral Q0600  . QUEtiapine  100 mg Oral QHS  . thiamine  100 mg Oral Daily   Or  . thiamine  100 mg Intravenous Daily  . ziprasidone  20 mg Oral Daily   Continuous Infusions:    LOS: 1 day    Time spent: 35 minutes    Irine Seal, MD [facility] Pager [phone]  If 7PM-7AM, please contact night-coverage [email or web address] Password [email or web address] 01/01/2018, 1:41 PM

## 2018-01-02 MED ORDER — QUETIAPINE FUMARATE 50 MG PO TABS
250.0000 mg | ORAL_TABLET | Freq: Every day | ORAL | Status: DC
  Administered 2018-01-02: 250 mg via ORAL
  Filled 2018-01-02: qty 5

## 2018-01-02 NOTE — Progress Notes (Signed)
PROGRESS NOTE    [name]  TKP:[id number] DOB: 05-Jun-1981 DOA: 12/30/2017 PCP: Inc, [facility]    Brief Narrative:  [name] is a 37 y.o. male with medical history significant for schizophrenia, alcohol dependence, alcohol withdrawal seizure, cirrhosis, htn, pancreatitis, breast cancer s/p mastectomy, cocaine abuse, thrombocytopenia, presenting with seizures.  Multiple recent admissions: two this month for alcohol detox, notable for etoh withdrawal seizure. Also admit in April and January for suicidal ideation. Presented to ED with seizure at home. Says was witnessed by one of his roommates. Bit his lip in the process. Denied SI but SI is reported in history obtained by ED provider. Says usually drinks 1/2 to 1 gallon of hard alcohol a day, most recently a few hours prior to arrival. Says unsure if has been diagnosed with epilepsy. Is not on an anti-epileptic. Desires detox. Denied inury other than to lip. No chest pain.  ED Course: bh eval, labs, CT head/c-spine      Assessment & Plan:   Principal Problem:   Alcohol withdrawal seizure without complication (HCC) Active Problems:   Essential hypertension   Elevated LFTs   Alcohol abuse   Episode of recurrent major depressive disorder (HCC)   Seizure ([place])   Suicidal ideation   Alcohol withdrawal (HCC)   Alcohol withdrawal seizure ([place])   Abdominal pain   #1 alcohol withdrawal seizure without complication/alcohol withdrawal/alcohol abuse Patient presented with alcohol withdrawal seizures.  Patient admitted to telemetry bed.  No seizures noted overnight.  Alcohol level on admission was 441 down to 338.  Salicylate level was less than 7.  Tylenol level is less than 10.  Salicylate level less than 7.  UDS was negative.  Patient more alert.  Alcohol cessation was stressed to patient again.  Continue the Ativan withdrawal protocol, thiamine, folic acid, multivitamin.  Tolerating current diet.    2.   Transaminitis Likely secondary to fatty liver as noted on CT abdomen and pelvis of 10/20/2017.  Acute hepatitis panel was checked on 12/13/2017 which was negative.  IVF been saline locked.  Outpatient follow-up.   3.  Hypertension Norvasc.   4.  Hypomagnesemia Repleted.  5.  Upper abdominal pain Likely secondary to gastritis.  Lipase levels within normal limits.  Improving clinically on PPI.  Supportive care.    DVT prophylaxis: SCDs Code Status: Full Family Communication: Updated patient.  No family at bedside. Disposition Plan: Home once Ativan withdrawal protocol has been completed hopefully tomorrow.   Consultants:   None  Procedures:   CXR 12/30/2017  CT head, CT C-spine, CT maxillofacial 12/30/2017  Antimicrobials:   None   Subjective: Sleeping.  Arousable.  Denies any abdominal pain.  Feeling better.  No chest pain.  No shortness of breath.  Tolerating current diet.    Objective: Vitals:   01/01/18 1956 01/02/18 0000 01/02/18 0433 01/02/18 0816  BP: (!) 153/107 (!) 138/115 (!) 136/99 (!) 135/100  Pulse: 99 89 (!) 102 (!) 103  Resp: 20 20 20 16   Temp: 98.2 F (36.8 C) 98 F (36.7 C) 98 F (36.7 C) 98.6 F (37 C)  TempSrc: Oral Oral Oral Oral  SpO2: 99% 98% 98% 98%  Weight:      Height:        Intake/Output Summary (Last 24 hours) at 01/02/2018 1045 Last data filed at 01/02/2018 0813 Gross per 24 hour  Intake 240 ml  Output 400 ml  Net -160 ml   Filed Weights   12/31/17 0248 12/31/17  0302  Weight: 65.8 kg (145 lb) 67.3 kg (148 lb 5.9 oz)    Examination:  General exam: Tremors improving. Respiratory system: Lungs clear to auscultation bilaterally.  No wheezes, no crackles, no rhonchi.  Normal respiratory effort. Cardiovascular system: RRR no murmurs rubs or gallops.  No lower extremity edema.  No JVD.   Gastrointestinal system: Abdomen is soft, nontender, nondistended, positive bowel sounds.  No rebound.  No guarding.   Central nervous system:  Alert and oriented. No focal neurological deficits. Extremities: Symmetric 5 x 5 power. Skin: No rashes, lesions or ulcers Psychiatry: Judgement and insight appear normal. Mood & affect appropriate.     Data Reviewed: I have personally reviewed following labs and imaging studies  CBC: Recent Labs  Lab 12/30/17 0009 12/30/17 1613 12/31/17 0844 01/01/18 0955  WBC 3.9* 3.3* 2.7* 3.4*  NEUTROABS 1.4*  --  0.9*  --   HGB 13.7 13.9 12.7* 15.5  HCT 40.8 42.2 38.8* 45.7  MCV 91.1 90.9 91.3 88.9  PLT 157 169 118* 829*   Basic Metabolic Panel: Recent Labs  Lab 12/30/17 0009 12/30/17 1613 12/31/17 0844 01/01/18 0955  NA 146* 142 138 136  K 4.0 3.9 3.5 4.7  CL 107 105 103 102  CO2 28 24 27 26   GLUCOSE 89 164* 80 78  BUN 11 6 <5* 5*  CREATININE 0.78 0.87 0.83 0.85  CALCIUM 8.3* 8.6* 8.2* 9.4  MG  --   --  1.5* 1.9   GFR: Estimated Creatinine Clearance: 106.3 mL/min (by C-G formula based on SCr of 0.85 mg/dL). Liver Function Tests: Recent Labs  Lab 12/30/17 0009 12/30/17 1613 12/31/17 0844 01/01/18 0955  AST 87* 87* 68* 80*  ALT 65* 63 49 51  ALKPHOS 71 71 64 75  BILITOT 0.3 0.2* 0.7 0.9  PROT 7.2 7.1 6.4* 7.2  ALBUMIN 3.7 3.7 3.4* 3.7   Recent Labs  Lab 12/31/17 0844  LIPASE 28   No results for input(s): AMMONIA in the last 168 hours. Coagulation Profile: No results for input(s): INR, PROTIME in the last 168 hours. Cardiac Enzymes: Recent Labs  Lab 12/30/17 1613  TROPONINI <0.03   BNP (last 3 results) No results for input(s): PROBNP in the last 8760 hours. HbA1C: No results for input(s): HGBA1C in the last 72 hours. CBG: No results for input(s): GLUCAP in the last 168 hours. Lipid Profile: No results for input(s): CHOL, HDL, LDLCALC, TRIG, CHOLHDL, LDLDIRECT in the last 72 hours. Thyroid Function Tests: No results for input(s): TSH, T4TOTAL, FREET4, T3FREE, THYROIDAB in the last 72 hours. Anemia Panel: No results for input(s): VITAMINB12, FOLATE,  FERRITIN, TIBC, IRON, RETICCTPCT in the last 72 hours. Sepsis Labs: No results for input(s): PROCALCITON, LATICACIDVEN in the last 168 hours.  No results found for this or any previous visit (from the past 240 hour(s)).       Radiology Studies: No results found.      Scheduled Meds: . amLODipine  5 mg Oral Daily  . busPIRone  10 mg Oral TID  . folic acid  1 mg Oral Daily  . LORazepam  0-4 mg Intravenous Q12H  . multivitamin with minerals  1 tablet Oral Daily  . pantoprazole  40 mg Oral Q0600  . QUEtiapine  250 mg Oral QHS  . thiamine  100 mg Oral Daily   Or  . thiamine  100 mg Intravenous Daily  . ziprasidone  20 mg Oral Daily   Continuous Infusions:    LOS: 2 days  Time spent: 35 minutes    Irine Seal, MD [facility] Pager [phone]  If 7PM-7AM, please contact night-coverage [email or web address] Password [email or web address] 01/02/2018, 10:45 AM

## 2018-01-03 ENCOUNTER — Ambulatory Visit (INDEPENDENT_AMBULATORY_CARE_PROVIDER_SITE_OTHER): Payer: Self-pay | Admitting: Orthopaedic Surgery

## 2018-01-03 DIAGNOSIS — F1010 Alcohol abuse, uncomplicated: Secondary | ICD-10-CM

## 2018-01-03 DIAGNOSIS — R945 Abnormal results of liver function studies: Secondary | ICD-10-CM

## 2018-01-03 DIAGNOSIS — F10230 Alcohol dependence with withdrawal, uncomplicated: Secondary | ICD-10-CM

## 2018-01-03 DIAGNOSIS — I10 Essential (primary) hypertension: Secondary | ICD-10-CM

## 2018-01-03 MED ORDER — PANTOPRAZOLE SODIUM 40 MG PO TBEC: 40.0000 mg | DELAYED_RELEASE_TABLET | Freq: Every day | ORAL | 0 refills | Status: DC

## 2018-01-03 MED ORDER — BUSPIRONE HCL 10 MG PO TABS: 10.0000 mg | ORAL_TABLET | Freq: Three times a day (TID) | ORAL | 0 refills | Status: DC

## 2018-01-03 MED ORDER — QUETIAPINE FUMARATE 100 MG PO TABS: 250.0000 mg | ORAL_TABLET | Freq: Every day | ORAL | 0 refills | Status: DC

## 2018-01-03 MED ORDER — AMLODIPINE BESYLATE 5 MG PO TABS: 5.0000 mg | ORAL_TABLET | Freq: Every day | ORAL | 0 refills | Status: DC

## 2018-01-03 MED ORDER — THIAMINE HCL 100 MG PO TABS: 100.0000 mg | ORAL_TABLET | Freq: Every day | ORAL | Status: DC

## 2018-01-03 MED ORDER — QUETIAPINE FUMARATE 100 MG PO TABS
250.0000 mg | ORAL_TABLET | Freq: Every day | ORAL | 0 refills | Status: DC
Start: 2018-01-03 — End: 2018-04-29

## 2018-01-03 MED ORDER — FOLIC ACID 1 MG PO TABS: 1.0000 mg | ORAL_TABLET | Freq: Every day | ORAL | Status: DC

## 2018-01-03 MED ORDER — ADULT MULTIVITAMIN W/MINERALS CH: 1.0000 | ORAL_TABLET | Freq: Every day | ORAL | Status: DC

## 2018-01-03 NOTE — Progress Notes (Signed)
Discharge instructions reviewed with patient. All questions answered at this time. RXs and note given. Transport home by family.   Ave Filter, RN

## 2018-01-03 NOTE — Discharge Summary (Addendum)
Physician Discharge Summary  [name] WLN:[id number] DOB: 1981/01/02 DOA: 12/30/2017  PCP: [facility]  Admit date: 12/30/2017 Discharge date: 01/03/2018  Time spent: 55 minutes  Recommendations for Outpatient Follow-up:  1. Follow-up at [facility] for alcohol rehab. 2. Follow-up with [facility] in 2 weeks.  On follow-up patient will need a comprehensive metabolic profile and a magnesium level done to follow-up on electrolytes, renal function, LFTs.   Discharge Diagnoses:  Principal Problem:   Alcohol withdrawal seizure without complication (HCC) Active Problems:   Essential hypertension   Elevated LFTs   Alcohol abuse   Episode of recurrent major depressive disorder (HCC)   Seizure ([place])   Suicidal ideation   Alcohol withdrawal ([place])   Alcohol withdrawal seizure ([place])   Abdominal pain   Discharge Condition: Stable and improved  Diet recommendation: Regular  Filed Weights   12/31/17 0248 12/31/17 0302  Weight: 65.8 kg (145 lb) 67.3 kg (148 lb 5.9 oz)    History of present illness:  Per Dr. [name] is a 37 y.o. male with medical history significant for schizophrenia, alcohol dependence, alcohol withdrawal seizure, cirrhosis, htn, pancreatitis, breast cancer s/p mastectomy, cocaine abuse, thrombocytopenia, presenting with seizures.   Multiple recent admissions: two this month for alcohol detox, notable for etoh withdrawal seizure. Also admit in April and January for suicidal ideation. Presented on day of admission with seizure at home. Says was witnessed by one of his roommates. Bit his lip in the process. Denies SI but SI is reported in history obtained by ED provider. Stated usually drinks 1/2 to 1 gallon of hard alcohol a day, most recently a few hours prior to arrival. Says unsure if has been diagnosed with epilepsy. Is not on an anti-epileptic. Desires detox. Denied inury other than to lip. No chest  pain.  ED Course: bh eval, labs, CT head/c-spine     Hospital Course:  1 alcohol withdrawal seizure without complication/alcohol withdrawal/alcohol abuse Patient presented with alcohol withdrawal seizures.  Patient admitted to telemetry bed.  No seizures noted during the hospitalization.  Alcohol level on admission was 441 down to 338.  Salicylate level was less than 7.  Tylenol level is less than 10.  Salicylate level less than 7.  UDS was negative.  Patient was placed on IV fluids, supportive care, Ativan withdrawal protocol.  Patient improved clinically.  Tremors improved.  Patient did not have any further seizures during the hospitalization.  Patient was maintained on thiamine folic acid multivitamin.  Patient was initially n.p.o. started on a diet which he tolerated and diet advanced to a regular diet which he tolerated.  Patient will be discharged home in stable and improved condition and will follow up at [facility] per patient tomorrow for alcohol rehab/detox.  Patient will be discharged in stable and improved condition.  2.  Transaminitis Likely secondary to fatty liver as noted on CT abdomen and pelvis of 10/20/2017.  Acute hepatitis panel was checked on 12/13/2017 which was negative.  Outpatient follow-up.   3.  Hypertension Maintained on home regimen of Norvasc.   4.  Hypomagnesemia Repleted.  5.  Upper abdominal pain Likely secondary to gastritis.  Lipase levels within normal limits.    Patient started on a PPI.  Upper abdominal pain resolved.  Outpatient follow-up with PCP.       Procedures:  CXR 12/30/2017  CT head, CT C-spine, CT maxillofacial 12/30/2017      Consultations:  None  Discharge  Exam: Vitals:   01/03/18 0925 01/03/18 1217  BP: (!) 136/93 (!) 134/98  Pulse: 93 93  Resp: 16 16  Temp: 98.2 F (36.8 C) 98.4 F (36.9 C)  SpO2: 98% 100%    General: NAD Cardiovascular: RRR Respiratory: CTAB  Discharge Instructions   Discharge Instructions     Diet general   Complete by:  As directed    Increase activity slowly   Complete by:  As directed      Allergies as of 01/03/2018      Reactions   Codeine Hives, Itching, Swelling, Other (See Comments)   Does not impair breathing, however   Penicillins Swelling, Other (See Comments)   Has patient had a PCN reaction causing immediate rash, facial/tongue/throat swelling, SOB or lightheadedness with hypotension: Yes Has patient had a PCN reaction causing severe rash involving mucus membranes or skin necrosis: Yes Has patient had a PCN reaction that required hospitalization Yes-ed visit Has patient had a PCN reaction occurring within the last 10 years: Yes If all of the above answers are "NO", then may proceed with Cephalosporin use.   Morphine Itching   Coconut Flavor Itching, Swelling   Coconut Oil Other (See Comments)   Cannot take with some of his meds   Grapefruit Concentrate Other (See Comments)   Cannot take with some of his meds   Morphine And Related Itching, Swelling   Oxycodone Itching, Swelling   Norco [hydrocodone-acetaminophen] Itching, Rash      Medication List    STOP taking these medications   KLONOPIN 1 MG tablet Generic drug:  clonazePAM     TAKE these medications   amLODipine 5 MG tablet Commonly known as:  NORVASC Take 1 tablet (5 mg total) by mouth daily.   busPIRone 10 MG tablet Commonly known as:  BUSPAR Take 1 tablet (10 mg total) by mouth 3 (three) times daily.   folic acid 1 MG tablet Commonly known as:  FOLVITE Take 1 tablet (1 mg total) by mouth daily. Start taking on:  01/04/2018   Ibuprofen 200 MG Caps Take 400 mg by mouth daily as needed (pain).   multivitamin with minerals Tabs tablet Take 1 tablet by mouth daily. Start taking on:  01/04/2018   pantoprazole 40 MG tablet Commonly known as:  PROTONIX Take 1 tablet (40 mg total) by mouth daily at 6 (six) AM. Start taking on:  01/04/2018   QUEtiapine 100 MG tablet Commonly known as:   SEROQUEL Take 2.5 tablets (250 mg total) by mouth at bedtime. What changed:  how much to take   thiamine 100 MG tablet Take 1 tablet (100 mg total) by mouth daily. Start taking on:  01/04/2018   ziprasidone 20 MG capsule Commonly known as:  GEODON Take 20 mg by mouth daily.      Allergies  Allergen Reactions  . Codeine Hives, Itching, Swelling and Other (See Comments)    Does not impair breathing, however  . Penicillins Swelling and Other (See Comments)    Has patient had a PCN reaction causing immediate rash, facial/tongue/throat swelling, SOB or lightheadedness with hypotension: Yes Has patient had a PCN reaction causing severe rash involving mucus membranes or skin necrosis: Yes Has patient had a PCN reaction that required hospitalization Yes-ed visit Has patient had a PCN reaction occurring within the last 10 years: Yes If all of the above answers are "NO", then may proceed with Cephalosporin use.   Marland Kitchen Morphine Itching  . Coconut Flavor Itching and Swelling  .  Coconut Oil Other (See Comments)    Cannot take with some of his meds  . Grapefruit Concentrate Other (See Comments)    Cannot take with some of his meds  . Morphine And Related Itching and Swelling  . Oxycodone Itching and Swelling  . Norco [Hydrocodone-Acetaminophen] Itching and Rash   Follow-up [place], [facility]. Schedule an appointment as soon as possible for a visit in 2 week(s).   Why:  f/u in 2 weeks. Contact information: [place] [address] [phone]            The results of significant diagnostics from this hospitalization (including imaging, microbiology, ancillary and laboratory) are listed below for reference.    Significant Diagnostic Studies: Dg Chest 2 View  Result Date: 12/30/2017 CLINICAL DATA:  Seizure today. EXAM: CHEST - 2 VIEW COMPARISON:  Chest x-ray dated 12/22/2017. FINDINGS: The heart size and mediastinal contours are  within normal limits. Both lungs are clear. The visualized skeletal structures are unremarkable. IMPRESSION: No active cardiopulmonary disease. Electronically Signed   By: Franki Cabot M.D.   On: 12/30/2017 17:36   Dg Chest 2 View  Result Date: 12/22/2017 CLINICAL DATA:  Chest pain EXAM: CHEST - 2 VIEW COMPARISON:  12/12/2017 FINDINGS: Low lung volumes. Heart and mediastinal contours are within normal limits. No focal opacities or effusions. No acute bony abnormality. IMPRESSION: Low volumes.  No active cardiopulmonary disease. Electronically Signed   By: Rolm Baptise M.D.   On: 12/22/2017 08:26   Dg Chest 2 View  Result Date: 12/12/2017 CLINICAL DATA:  Cough. EXAM: CHEST - 2 VIEW COMPARISON:  Radiographs of Nov 22, 2017. FINDINGS: The heart size and mediastinal contours are within normal limits. Both lungs are clear. No pneumothorax or pleural effusion is noted. The visualized skeletal structures are unremarkable. IMPRESSION: No active cardiopulmonary disease. Electronically Signed   By: Marijo Conception, M.D.   On: 12/12/2017 18:37   Ct Head Wo Contrast  Result Date: 12/30/2017 CLINICAL DATA:  Injuries from seizure, RIGHT cheek and bottom lip are swollen, tachycardia, ETOH. EXAM: CT HEAD WITHOUT CONTRAST CT MAXILLOFACIAL WITHOUT CONTRAST CT CERVICAL SPINE WITHOUT CONTRAST TECHNIQUE: Multidetector CT imaging of the head, cervical spine, and maxillofacial structures were performed using the standard protocol without intravenous contrast. Multiplanar CT image reconstructions of the cervical spine and maxillofacial structures were also generated. COMPARISON:  Head CT dated 12/12/2017. FINDINGS: CT HEAD FINDINGS Brain: Ventricles are stable in size and configuration. There is no mass, hemorrhage, edema or other evidence of acute parenchymal abnormality. No extra-axial hemorrhage. Vascular: No hyperdense vessel or unexpected calcification. Skull: Normal. Negative for fracture or focal lesion. Other: None. CT  MAXILLOFACIAL FINDINGS Osseous: Lower frontal bones are intact and normally aligned. No displaced nasal bone fracture seen. Osseous structures about the orbits are intact and normally aligned. Walls of the maxillary sinuses are intact and normally aligned. Bilateral zygomatic arches and pterygoid plates are intact. No mandible fracture or displacement seen. Multiple missing teeth. Lucencies at the bases of multiple remaining maxillary and mandibular teeth raise the possibility of periodontal abscesses. Orbits: Negative. No traumatic or inflammatory finding. Sinuses: Clear. Soft tissues: Ill-defined edema within the subcutaneous soft tissues overlying the maxilla and mandible, bilateral, most prominent along the anterior margin of the lower mandible. No circumscribed soft tissue hematoma seen. CT CERVICAL SPINE FINDINGS Alignment: Mild levoscoliosis. No evidence of acute vertebral body subluxation. Skull base and vertebrae: No fracture line or displaced fracture  fragment. Facet joints appear intact and normally aligned throughout. Soft tissues and spinal canal: No prevertebral fluid or swelling. No visible canal hematoma. Disc levels: Mild disc desiccations at multiple levels, but no more than mild central canal stenosis at any level. Upper chest: Presumed soft tissue edema within the subcutaneous soft tissues of the upper chest Other: None. IMPRESSION: 1. No acute intracranial abnormality. No intracranial hemorrhage or edema. No skull fracture. 2. No facial bone fracture or dislocation. Lucencies at the bases of multiple maxillary and mandibular teeth raising the possibility of periodontal abscesses. 3. Ill-defined edema within the subcutaneous soft tissues overlying the bilateral maxilla and mandible, most prominent overlying the anterior margin of the lower mandible, without circumscribed hematoma. 4. No fracture or acute subluxation within the cervical spine. 5. Soft tissue thickening/nodularity within the  subcutaneous soft tissues of the upper chest, similar soft tissue mass/nodularity demonstrated on earlier chest CT of 06/02/2015 suggesting benignity, considerations including superficial varices or postsurgical change. Electronically Signed   By: Franki Cabot M.D.   On: 12/30/2017 17:35   Ct Head Wo Contrast  Result Date: 12/12/2017 CLINICAL DATA:  Acute onset of seizure and fall. Hit back of head on kitchen floor. Initial encounter. EXAM: CT HEAD WITHOUT CONTRAST TECHNIQUE: Contiguous axial images were obtained from the base of the skull through the vertex without intravenous contrast. COMPARISON:  CT of the head performed 10/19/2017 FINDINGS: Brain: No evidence of acute infarction, hemorrhage, hydrocephalus, extra-axial collection or mass lesion / mass effect. Prominence of the sulci suggests mild cortical volume loss. The brainstem and fourth ventricle are within normal limits. The basal ganglia are unremarkable in appearance. The cerebral hemispheres demonstrate grossly normal gray-white differentiation. No mass effect or midline shift is seen. Vascular: No hyperdense vessel or unexpected calcification. Skull: There is no evidence of fracture; visualized osseous structures are unremarkable in appearance. Sinuses/Orbits: The orbits are within normal limits. The paranasal sinuses and mastoid air cells are well-aerated. Other: No significant soft tissue abnormalities are seen. IMPRESSION: 1. No evidence of traumatic intracranial injury or fracture. 2. Mild cortical volume loss noted. Electronically Signed   By: Garald Balding M.D.   On: 12/12/2017 23:35   Ct Angio Chest Pe W And/or Wo Contrast  Result Date: 12/22/2017 CLINICAL DATA:  Shortness of breath. EXAM: CT ANGIOGRAPHY CHEST WITH CONTRAST TECHNIQUE: Multidetector CT imaging of the chest was performed using the standard protocol during bolus administration of intravenous contrast. Multiplanar CT image reconstructions and MIPs were obtained to evaluate  the vascular anatomy. CONTRAST:  175mL ISOVUE-370 IOPAMIDOL (ISOVUE-370) INJECTION 76% COMPARISON:  Is chest x-ray 12/22/2017 FINDINGS: Cardiovascular: Pulmonary arteries are moderately well opacified. There is no evidence for pulmonary embolus to the segmental level. Smaller pulmonary emboli would be difficult to detect given the artifact in the smaller vessels. Heart size normal. No pericardial effusion. The thoracic aorta is not aneurysmal. No evidence for dissection or significant atherosclerotic calcification. Mediastinum/Nodes: The visualized portion of the thyroid gland has a normal appearance. Esophagus is normal. No mediastinal, hilar, or axillary adenopathy. Lungs/Pleura: Lungs are clear. No pleural effusion or pneumothorax. Upper Abdomen: There is diffuse low-attenuation of the liver consistent with hepatic steatosis. Musculoskeletal: No chest wall abnormality. No acute or significant osseous findings. There is asymmetry chest wall soft tissue, RIGHT greater than LEFT. History provided the electronic medical record is bilateral mastectomy for breast cancer. However, there is significant bilateral chest wall soft tissue raising the question of gynecomastia versus recurrence of breast cancer. Review of the MIP  images confirms the above findings. IMPRESSION: 1. The study is adequate to exclude larger pulmonary emboli but is inadequate to exclude smaller pulmonary emboli. No pulmonary infarcts or suspicious pulmonary nodules. No infiltrates or effusions. 2. Hepatic steatosis. 3. Significant anterior chest wall soft tissue, RIGHT greater than LEFT. Recommend physical exam for correlation. Consider bilateral diagnostic mammogram to exclude malignancy. Electronically Signed   By: Nolon Nations M.D.   On: 12/22/2017 13:54   Ct Cervical Spine Wo Contrast  Result Date: 12/30/2017 CLINICAL DATA:  Injuries from seizure, RIGHT cheek and bottom lip are swollen, tachycardia, ETOH. EXAM: CT HEAD WITHOUT CONTRAST  CT MAXILLOFACIAL WITHOUT CONTRAST CT CERVICAL SPINE WITHOUT CONTRAST TECHNIQUE: Multidetector CT imaging of the head, cervical spine, and maxillofacial structures were performed using the standard protocol without intravenous contrast. Multiplanar CT image reconstructions of the cervical spine and maxillofacial structures were also generated. COMPARISON:  Head CT dated 12/12/2017. FINDINGS: CT HEAD FINDINGS Brain: Ventricles are stable in size and configuration. There is no mass, hemorrhage, edema or other evidence of acute parenchymal abnormality. No extra-axial hemorrhage. Vascular: No hyperdense vessel or unexpected calcification. Skull: Normal. Negative for fracture or focal lesion. Other: None. CT MAXILLOFACIAL FINDINGS Osseous: Lower frontal bones are intact and normally aligned. No displaced nasal bone fracture seen. Osseous structures about the orbits are intact and normally aligned. Walls of the maxillary sinuses are intact and normally aligned. Bilateral zygomatic arches and pterygoid plates are intact. No mandible fracture or displacement seen. Multiple missing teeth. Lucencies at the bases of multiple remaining maxillary and mandibular teeth raise the possibility of periodontal abscesses. Orbits: Negative. No traumatic or inflammatory finding. Sinuses: Clear. Soft tissues: Ill-defined edema within the subcutaneous soft tissues overlying the maxilla and mandible, bilateral, most prominent along the anterior margin of the lower mandible. No circumscribed soft tissue hematoma seen. CT CERVICAL SPINE FINDINGS Alignment: Mild levoscoliosis. No evidence of acute vertebral body subluxation. Skull base and vertebrae: No fracture line or displaced fracture fragment. Facet joints appear intact and normally aligned throughout. Soft tissues and spinal canal: No prevertebral fluid or swelling. No visible canal hematoma. Disc levels: Mild disc desiccations at multiple levels, but no more than mild central canal  stenosis at any level. Upper chest: Presumed soft tissue edema within the subcutaneous soft tissues of the upper chest Other: None. IMPRESSION: 1. No acute intracranial abnormality. No intracranial hemorrhage or edema. No skull fracture. 2. No facial bone fracture or dislocation. Lucencies at the bases of multiple maxillary and mandibular teeth raising the possibility of periodontal abscesses. 3. Ill-defined edema within the subcutaneous soft tissues overlying the bilateral maxilla and mandible, most prominent overlying the anterior margin of the lower mandible, without circumscribed hematoma. 4. No fracture or acute subluxation within the cervical spine. 5. Soft tissue thickening/nodularity within the subcutaneous soft tissues of the upper chest, similar soft tissue mass/nodularity demonstrated on earlier chest CT of 06/02/2015 suggesting benignity, considerations including superficial varices or postsurgical change. Electronically Signed   By: Franki Cabot M.D.   On: 12/30/2017 17:35   Ct Maxillofacial Wo Contrast  Result Date: 12/30/2017 CLINICAL DATA:  Injuries from seizure, RIGHT cheek and bottom lip are swollen, tachycardia, ETOH. EXAM: CT HEAD WITHOUT CONTRAST CT MAXILLOFACIAL WITHOUT CONTRAST CT CERVICAL SPINE WITHOUT CONTRAST TECHNIQUE: Multidetector CT imaging of the head, cervical spine, and maxillofacial structures were performed using the standard protocol without intravenous contrast. Multiplanar CT image reconstructions of the cervical spine and maxillofacial structures were also generated. COMPARISON:  Head CT dated 12/12/2017. FINDINGS:  CT HEAD FINDINGS Brain: Ventricles are stable in size and configuration. There is no mass, hemorrhage, edema or other evidence of acute parenchymal abnormality. No extra-axial hemorrhage. Vascular: No hyperdense vessel or unexpected calcification. Skull: Normal. Negative for fracture or focal lesion. Other: None. CT MAXILLOFACIAL FINDINGS Osseous: Lower frontal  bones are intact and normally aligned. No displaced nasal bone fracture seen. Osseous structures about the orbits are intact and normally aligned. Walls of the maxillary sinuses are intact and normally aligned. Bilateral zygomatic arches and pterygoid plates are intact. No mandible fracture or displacement seen. Multiple missing teeth. Lucencies at the bases of multiple remaining maxillary and mandibular teeth raise the possibility of periodontal abscesses. Orbits: Negative. No traumatic or inflammatory finding. Sinuses: Clear. Soft tissues: Ill-defined edema within the subcutaneous soft tissues overlying the maxilla and mandible, bilateral, most prominent along the anterior margin of the lower mandible. No circumscribed soft tissue hematoma seen. CT CERVICAL SPINE FINDINGS Alignment: Mild levoscoliosis. No evidence of acute vertebral body subluxation. Skull base and vertebrae: No fracture line or displaced fracture fragment. Facet joints appear intact and normally aligned throughout. Soft tissues and spinal canal: No prevertebral fluid or swelling. No visible canal hematoma. Disc levels: Mild disc desiccations at multiple levels, but no more than mild central canal stenosis at any level. Upper chest: Presumed soft tissue edema within the subcutaneous soft tissues of the upper chest Other: None. IMPRESSION: 1. No acute intracranial abnormality. No intracranial hemorrhage or edema. No skull fracture. 2. No facial bone fracture or dislocation. Lucencies at the bases of multiple maxillary and mandibular teeth raising the possibility of periodontal abscesses. 3. Ill-defined edema within the subcutaneous soft tissues overlying the bilateral maxilla and mandible, most prominent overlying the anterior margin of the lower mandible, without circumscribed hematoma. 4. No fracture or acute subluxation within the cervical spine. 5. Soft tissue thickening/nodularity within the subcutaneous soft tissues of the upper chest,  similar soft tissue mass/nodularity demonstrated on earlier chest CT of 06/02/2015 suggesting benignity, considerations including superficial varices or postsurgical change. Electronically Signed   By: Franki Cabot M.D.   On: 12/30/2017 17:35    Microbiology: No results found for this or any previous visit (from the past 240 hour(s)).   Labs: Basic Metabolic Panel: Recent Labs  Lab 12/30/17 0009 12/30/17 1613 12/31/17 0844 01/01/18 0955  NA 146* 142 138 136  K 4.0 3.9 3.5 4.7  CL 107 105 103 102  CO2 28 24 27 26   GLUCOSE 89 164* 80 78  BUN 11 6 <5* 5*  CREATININE 0.78 0.87 0.83 0.85  CALCIUM 8.3* 8.6* 8.2* 9.4  MG  --   --  1.5* 1.9   Liver Function Tests: Recent Labs  Lab 12/30/17 0009 12/30/17 1613 12/31/17 0844 01/01/18 0955  AST 87* 87* 68* 80*  ALT 65* 63 49 51  ALKPHOS 71 71 64 75  BILITOT 0.3 0.2* 0.7 0.9  PROT 7.2 7.1 6.4* 7.2  ALBUMIN 3.7 3.7 3.4* 3.7   Recent Labs  Lab 12/31/17 0844  LIPASE 28   No results for input(s): AMMONIA in the last 168 hours. CBC: Recent Labs  Lab 12/30/17 0009 12/30/17 1613 12/31/17 0844 01/01/18 0955  WBC 3.9* 3.3* 2.7* 3.4*  NEUTROABS 1.4*  --  0.9*  --   HGB 13.7 13.9 12.7* 15.5  HCT 40.8 42.2 38.8* 45.7  MCV 91.1 90.9 91.3 88.9  PLT 157 169 118* 135*   Cardiac Enzymes: Recent Labs  Lab 12/30/17 1613  TROPONINI <0.03  BNP: BNP (last 3 results) No results for input(s): BNP in the last 8760 hours.  ProBNP (last 3 results) No results for input(s): PROBNP in the last 8760 hours.  CBG: No results for input(s): GLUCAP in the last 168 hours.     Signed:  Irine Seal MD.  [facility] 01/03/2018, 4:33 PM

## 2018-01-03 NOTE — Care Management Note (Signed)
Case Management Note  Patient Details  Name: [name] MRN: [id number] Date of Birth: 01-16-1981  Subjective/Objective:       Pt admitted with alcohol withdrawal. He is from home with friends. Pt has started paperwork for [facility] for PCP. He states he also has an orange card. He gets his medications through [employer] through [place].              Action/Plan: Pt discharging home with self care. Pt states he can get his medications filled at [place] in the morning. Pt states his mother can provide transportation home.   Expected Discharge Date:  01/03/18               Expected Discharge Plan:  Home/Self Care  In-House Referral:     Discharge planning Services  CM Consult  Post Acute Care Choice:    Choice offered to:     DME Arranged:    DME Agency:     HH Arranged:    HH Agency:     Status of Service:  Completed, signed off  If discussed at [employer] of Stay Meetings, dates discussed:    Additional Comments:  Pollie Friar, RN 01/03/2018, 4:25 PM

## 2018-01-06 ENCOUNTER — Other Ambulatory Visit: Payer: Self-pay | Admitting: Emergency Medicine

## 2018-01-10 ENCOUNTER — Encounter (HOSPITAL_COMMUNITY): Payer: Self-pay

## 2018-01-10 ENCOUNTER — Emergency Department (HOSPITAL_COMMUNITY)
Admission: EM | Admit: 2018-01-10 | Discharge: 2018-01-10 | Disposition: A | Discharge status: Home / Self Care | Payer: Self-pay | Attending: Emergency Medicine | Admitting: Emergency Medicine

## 2018-01-10 ENCOUNTER — Other Ambulatory Visit: Payer: Self-pay

## 2018-01-10 DIAGNOSIS — Z79899 Other long term (current) drug therapy: Secondary | ICD-10-CM | POA: Insufficient documentation

## 2018-01-10 DIAGNOSIS — I10 Essential (primary) hypertension: Secondary | ICD-10-CM | POA: Insufficient documentation

## 2018-01-10 DIAGNOSIS — Z87891 Personal history of nicotine dependence: Secondary | ICD-10-CM | POA: Insufficient documentation

## 2018-01-10 DIAGNOSIS — R569 Unspecified convulsions: Secondary | ICD-10-CM | POA: Insufficient documentation

## 2018-01-10 DIAGNOSIS — Z85528 Personal history of other malignant neoplasm of kidney: Secondary | ICD-10-CM | POA: Insufficient documentation

## 2018-01-10 LAB — RAPID URINE DRUG SCREEN, HOSP PERFORMED
AMPHETAMINES: NOT DETECTED
Benzodiazepines: NOT DETECTED
COCAINE: DETECTED — AB
Opiates: NOT DETECTED
TETRAHYDROCANNABINOL: NOT DETECTED

## 2018-01-10 LAB — CBC WITH DIFFERENTIAL/PLATELET
BASOS PCT: 1 %
Basophils Absolute: 0.1 10*3/uL (ref 0.0–0.1)
EOS ABS: 0.1 10*3/uL (ref 0.0–0.7)
EOS PCT: 3 %
HCT: 42.4 % (ref 39.0–52.0)
Hemoglobin: 14 g/dL (ref 13.0–17.0)
LYMPHS ABS: 1.6 10*3/uL (ref 0.7–4.0)
Lymphocytes Relative: 40 %
MCH: 31 pg (ref 26.0–34.0)
MCHC: 33 g/dL (ref 30.0–36.0)
MCV: 93.8 fL (ref 78.0–100.0)
MONO ABS: 0.5 10*3/uL (ref 0.1–1.0)
MONOS PCT: 13 %
NEUTROS PCT: 43 %
Neutro Abs: 1.7 10*3/uL (ref 1.7–7.7)
Platelets: 196 10*3/uL (ref 150–400)
RBC: 4.52 MIL/uL (ref 4.22–5.81)
RDW: 16 % — ABNORMAL HIGH (ref 11.5–15.5)
WBC: 3.9 10*3/uL — ABNORMAL LOW (ref 4.0–10.5)

## 2018-01-10 LAB — COMPREHENSIVE METABOLIC PANEL
ALBUMIN: 3.9 g/dL (ref 3.5–5.0)
ALK PHOS: 56 U/L (ref 38–126)
ALT: 40 U/L (ref 0–44)
AST: 38 U/L (ref 15–41)
Anion gap: 8 (ref 5–15)
BUN: 10 mg/dL (ref 6–20)
CALCIUM: 9.6 mg/dL (ref 8.9–10.3)
CHLORIDE: 106 mmol/L (ref 98–111)
CO2: 27 mmol/L (ref 22–32)
CREATININE: 0.96 mg/dL (ref 0.61–1.24)
GFR calc Af Amer: >60 mL/min (ref >60)
GFR calc non Af Amer: >60 mL/min (ref >60)
GLUCOSE: 63 mg/dL — AB (ref 70–99)
Potassium: 3.3 mmol/L — ABNORMAL LOW (ref 3.5–5.1)
SODIUM: 141 mmol/L (ref 135–145)
Total Bilirubin: 0.9 mg/dL (ref 0.3–1.2)
Total Protein: 7.4 g/dL (ref 6.5–8.1)

## 2018-01-10 LAB — ETHANOL: Alcohol, Ethyl (B): <10 mg/dL (ref <10)

## 2018-01-10 LAB — MAGNESIUM: Magnesium: 2.1 mg/dL (ref 1.7–2.4)

## 2018-01-10 MED ORDER — SODIUM CHLORIDE 0.9 % IV BOLUS: 1000.0000 mL | Freq: Once | INTRAVENOUS | Status: DC

## 2018-01-10 NOTE — ED Notes (Signed)
Bed: WA20 Expected date: 01/10/18 Expected time: 1:03 AM Means of arrival:  Comments: EMS

## 2018-01-10 NOTE — ED Provider Notes (Signed)
[facility] DEPT Provider Note   CSN: [id number] Arrival date & time: 01/10/18  0114     History   Chief Complaint Chief Complaint  Patient presents with  . Seizure    HPI [name] is a 37 y.o. male.  HPI [name] is a 37 y.o. male with hx of bipolar disorder, anxiety, alcohol abuse, liver cirrhosis, alcohol withdrawal seizures, presents to ed stating he has had 3 seizures.  He states that 2 of them were at home states were witnessed by his friend.  States 1 of them was witnessed by EMS.  According to RN report, EMS did witness him having some shaking episode but he was alert and oriented and even was able to talk during his episodes.  Patient with recent admission to the hospital for alcohol withdrawal seizures.  He states he has not had any alcohol since discharge 1 week ago, states he now has not had any alcohol in 11 days.  He states he is doing well otherwise.  Denies any nausea or vomiting.  No chest pain or abdominal pain.  No headache.  Denies any drugs.  Has been compliant with all of his medications.  Past Medical History:  Diagnosis Date  . Angina   . Anxiety    panic attack  . Bipolar 1 disorder ([place])   . Breast CA ([place]) dx'd 2009   bil w/ bil masectomy and oral meds  . Cancer [facility])    kidney cancer  . Coronary artery disease   . Depression   . H/O suicide attempt 2015   overdose  . Headache(784.0)   . Hypercholesteremia   . Hypertension   . Liver cirrhosis ([place])   . Pancreatitis   . Peripheral vascular disease [facility]) April 2011   Left Pop  . Schizophrenia ([place])   . Seizures ([place])    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  . Shortness of breath     Patient Active Problem List   Diagnosis Date Noted  . Alcohol withdrawal seizure ([place]) 12/31/2017  . Abdominal pain 12/31/2017  . Alcohol withdrawal seizure without complication ([place])   . Melena 12/12/2017  . Emesis 10/20/2017  . Displaced fracture of lateral end of left  clavicle, initial encounter for closed fracture 08/05/2017  . Coracoclavicular (ligament) sprain and strain, left, initial encounter 08/05/2017  . Closed dislocation of acromioclavicular joint, initial encounter 08/05/2017  . Insomnia   . Hypertension   . Severe episode of recurrent major depressive disorder, without psychotic features ([place])   . Depression, major, severe recurrence ([place]) 12/30/2015  . Substance induced mood disorder ([place]) 12/02/2015  . Mood disorder in conditions classified elsewhere   . Malnutrition of moderate degree 09/24/2015  . Alcohol withdrawal ([place]) 09/23/2015  . Tobacco use disorder 07/16/2015  . Elevated transaminase level   . Drug overdose, intentional ([place]) 07/12/2015  . Alcohol use disorder, severe, dependence ([place]) 04/15/2015  . Cocaine abuse with cocaine-induced mood disorder ([place]) 04/11/2015  . Overdose 04/10/2015  . Severe recurrent major depressive disorder with psychotic features ([place])   . Severe major depression with psychotic features ([place]) 09/11/2014  . Alcohol-induced mood disorder ([place]) 09/10/2014  . Suicidal ideation   . Tylenol overdose   . Polysubstance abuse ([place])   . Overdose of acetaminophen 08/03/2014  . Overdose by acetaminophen 08/03/2014  . Cocaine abuse ([place]) 04/16/2014  . Thrombocytopenia ([place]) 04/15/2014  . Urinary tract infection, site not specified 04/15/2014  . Chest pain, unspecified 04/15/2014  . D-dimer, elevated  04/15/2014  . Transaminitis 09/24/2013  . Cocaine dependence ([place]) 09/20/2013  . S/p nephrectomy 04/28/2013  . Seizure ([place]) 03/15/2013  . Syncope 01/02/2013  . Leukocytopenia, unspecified 01/02/2013  . Left kidney mass 12/24/2012  . PTSD (post-traumatic stress disorder) 07/06/2012  . Episode of recurrent major depressive disorder ([place]) 06/29/2012    Class: Acute  . Peripheral vascular disease ([place]) 01/14/2012  . Alcohol abuse 10/13/2011  . SEIZURE DISORDER 10/03/2008  . Elevated LFTs 12/29/2007  .  HYPERCHOLESTEROLEMIA 03/21/2007  . Essential hypertension 03/21/2007    Past Surgical History:  Procedure Laterality Date  . BREAST SURGERY    . BREAST SURGERY     bilateral breast silocone  removal  . CHEST SURGERY    . left kidney removal    . left leg surgery     "popiteal artery clogged"  . MASTECTOMY Bilateral   . NEPHRECTOMY Left   . ORIF CLAVICULAR FRACTURE Left 08/10/2017   Procedure: OPEN REDUCTION INTERNAL FIXATION (ORIF) LEFT CLAVICLE FRACTURE WITH RECONSTRUCTION OF CORACOCLAVICULAR LIGAMENT;  Surgeon: Leandrew Koyanagi, MD;  Location: [facility];  Service: Orthopedics;  Laterality: Left;  . RECONSTRUCTION OF CORACOCLAVICULAR LIGAMENT Left 08/10/2017   Procedure: RECONSTRUCTION OF CORACOCLAVICULAR LIGAMENT;  Surgeon: Leandrew Koyanagi, MD;  Location: [facility];  Service: Orthopedics;  Laterality: Left;        Home Medications    Prior to Admission medications   Medication Sig Start Date End Date Taking? Authorizing Provider  amLODipine (NORVASC) 5 MG tablet Take 1 tablet (5 mg total) by mouth daily. 01/03/18   Eugenie Filler, MD  busPIRone (BUSPAR) 10 MG tablet Take 1 tablet (10 mg total) by mouth 3 (three) times daily. 01/03/18   Eugenie Filler, MD  folic acid (FOLVITE) 1 MG tablet Take 1 tablet (1 mg total) by mouth daily. 01/04/18   Eugenie Filler, MD  Ibuprofen 200 MG CAPS Take 400 mg by mouth daily as needed (pain).    [provider]  Multiple Vitamin (MULTIVITAMIN WITH MINERALS) TABS tablet Take 1 tablet by mouth daily. 01/04/18   Eugenie Filler, MD  pantoprazole (PROTONIX) 40 MG tablet Take 1 tablet (40 mg total) by mouth daily at 6 (six) AM. 01/04/18   Eugenie Filler, MD  QUEtiapine (SEROQUEL) 100 MG tablet Take 2.5 tablets (250 mg total) by mouth at bedtime. 01/03/18   Eugenie Filler, MD  thiamine 100 MG tablet Take 1 tablet (100 mg total) by mouth daily. 01/04/18   Eugenie Filler, MD  ziprasidone (GEODON) 20 MG capsule Take 20 mg by mouth daily.     [provider]    Family History Family History  Problem Relation Age of Onset  . Stroke Other   . Cancer Other   . Hyperlipidemia Mother   . Hypertension Mother     Social History Social History   Tobacco Use  . Smoking status: Former Research scientist (life sciences)  . Smokeless tobacco: Never Used  . Tobacco comment: smoked on and off 16 years- quit 2016  Substance Use Topics  . Alcohol use: Yes    Comment: 5 bottle of wine daily, last consumption 1 hr PTA  . Drug use: No    Frequency: 1.0 times per week    Types: "Crack" cocaine, Cocaine    Comment: clean 3 weeks     Allergies   Codeine; Penicillins; Morphine; Coconut flavor; Coconut oil; Grapefruit concentrate; Morphine and related; Oxycodone; and Norco [hydrocodone-acetaminophen]   Review of Systems Review of Systems  Constitutional: Negative for chills and fever.  Respiratory: Negative for cough, chest tightness and shortness of breath.   Cardiovascular: Negative for chest pain, palpitations and leg swelling.  Gastrointestinal: Negative for abdominal distention, abdominal pain, diarrhea, nausea and vomiting.  Genitourinary: Negative for dysuria, frequency, hematuria and urgency.  Musculoskeletal: Negative for arthralgias, myalgias, neck pain and neck stiffness.  Skin: Negative for rash.  Allergic/Immunologic: Negative for immunocompromised state.  Neurological: Positive for seizures. Negative for dizziness, weakness, light-headedness, numbness and headaches.  All other systems reviewed and are negative.    Physical Exam Updated Vital Signs BP (!) 140/103 (BP Location: Left Arm)   Pulse (!) 101   Temp 98.3 F (36.8 C) (Oral)   Resp 16   Ht 5\' 6"  (1.676 m)   Wt 72.6 kg (160 lb)   SpO2 97%   BMI 25.82 kg/m   Physical Exam  Constitutional: He appears well-developed and well-nourished. No distress.  HENT:  Head: Normocephalic and atraumatic.  Eyes: Conjunctivae are normal.  Neck: Neck supple.  Cardiovascular:  Normal rate, regular rhythm and normal heart sounds.  Pulmonary/Chest: Effort normal. No respiratory distress. He has no wheezes. He has no rales.  Abdominal: Soft. Bowel sounds are normal. He exhibits no distension. There is no tenderness. There is no rebound.  Musculoskeletal: He exhibits no edema.  Neurological: He is alert.  Skin: Skin is warm and dry.  Nursing note and vitals reviewed.    ED Treatments / Results  Labs (all labs ordered are listed, but only abnormal results are displayed) Labs Reviewed  CBC WITH DIFFERENTIAL/PLATELET - Abnormal; Notable for the following components:      Result Value   WBC 3.9 (*)    RDW 16.0 (*)    All other components within normal limits  COMPREHENSIVE METABOLIC PANEL - Abnormal; Notable for the following components:   Potassium 3.3 (*)    Glucose, Bld 63 (*)    All other components within normal limits  RAPID URINE DRUG SCREEN, HOSP PERFORMED - Abnormal; Notable for the following components:   Cocaine DETECTED (*)    Barbiturates   (*)    Value: Result not available. Reagent lot number recalled by manufacturer.   All other components within normal limits  ETHANOL  MAGNESIUM    EKG None  Radiology No results found.  Procedures Procedures (including critical care time)  Medications Ordered in ED Medications - No data to display   Initial Impression / Assessment and Plan / ED Course  I have reviewed the triage vital signs and the nursing notes.  Pertinent labs & imaging results that were available during my care of the patient were reviewed by me and considered in my medical decision making (see chart for details).     Pt with seizure like activity. Hx of alcohol withdrawal sezures, but no epilepsy. No alcohol reported for 11 days.  Patient is currently in no acute distress.  Normal vital signs.  Will monitor, will check labs, drug screen, alcohol level.  6:17 AM Labs unremarkable.  Glucose of 63, said patient.  8 with no  problems.  Patient monitored in the emergency department no more episodes of seizures.  Cocaine detected in urine, patient denies any use.  His vital signs are normal.  No signs of withdrawal.  He is in no acute distress.  Not even convinced that these are real seizures since EMS reported he was talking during 1 of the seizures.  Will discharge home and have him follow-up with family  doctor. Advised to stop using drugs and continue refrain from drinking.   Vitals:   01/10/18 0120 01/10/18 0122 01/10/18 0130 01/10/18 0334  BP:  (!) 140/103  (!) 143/112  Pulse:  (!) 101  80  Resp:  16  13  Temp:  98.3 F (36.8 C)    TempSrc:  Oral    SpO2: 97% 97%  100%  Weight:  72.6 kg (160 lb) 72.6 kg (160 lb)   Height:  5\' 6"  (1.676 m) 5\' 6"  (1.676 m)      Final Clinical Impressions(s) / ED Diagnoses   Final diagnoses:  Seizure-like activity [facility])    ED Discharge Orders    None       Jeannett Senior, PA-C 01/10/18 [id number]    [place], Jenny Reichmann, MD 01/10/18 [phone]
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
## 2018-01-10 NOTE — Discharge Instructions (Addendum)
Avoid drugs or alcohol.  Drink plenty of fluids.  Follow-up with family doctor.  Return as needed

## 2018-01-11 ENCOUNTER — Ambulatory Visit (INDEPENDENT_AMBULATORY_CARE_PROVIDER_SITE_OTHER): Payer: Self-pay

## 2018-01-11 ENCOUNTER — Ambulatory Visit (INDEPENDENT_AMBULATORY_CARE_PROVIDER_SITE_OTHER): Payer: Self-pay | Admitting: Orthopaedic Surgery

## 2018-01-11 ENCOUNTER — Encounter (INDEPENDENT_AMBULATORY_CARE_PROVIDER_SITE_OTHER): Payer: Self-pay | Admitting: Orthopaedic Surgery

## 2018-01-11 DIAGNOSIS — G8929 Other chronic pain: Secondary | ICD-10-CM

## 2018-01-11 DIAGNOSIS — M25512 Pain in left shoulder: Secondary | ICD-10-CM

## 2018-01-11 DIAGNOSIS — M898X1 Other specified disorders of bone, shoulder: Secondary | ICD-10-CM

## 2018-01-11 NOTE — Progress Notes (Signed)
Office Visit Note   Patient: [name]           Date of Birth: December 27, 1980           MRN: [id number] Visit Date: 01/11/2018              Requested by: [facility] [address], [place] [id number] PCP: [facility]   Assessment & Plan: Visit Diagnoses:  1. Chronic left shoulder pain   2. Pain of left clavicle     Plan: Today, we will proceed with a left shoulder subacromial Toradol injection.  He will follow-up with [place] as needed.  Call with concerns or questions in the meantime.  Follow-Up Instructions: Return if symptoms worsen or fail to improve.   Orders:  Orders Placed This Encounter  Procedures  . Large Joint Inj: L subacromial bursa  . XR Clavicle Left  . XR Shoulder Left   No orders of the defined types were placed in this encounter.     Procedures: Large Joint Inj: L subacromial bursa on 01/11/2018 3:38 PM Indications: pain Details: 22 G needle Medications: 2 mL lidocaine 2 %; 2 mL bupivacaine 0.25 % Outcome: tolerated well, no immediate complications Patient was prepped and draped in the usual sterile fashion.       Clinical Data: No additional findings.   Subjective: Chief Complaint  Patient presents with  . Left Shoulder - Follow-up    Left clavicle, 5 mos post ORIF fracture    HPI patient is a pleasant 38 year old gentleman who presents to our clinic today with recurrent left shoulder pain.  He is 5 months status post ORIF left clavicle fracture with reconstruction of the coracoclavicular ligament, date of surgery 08/10/2017.  He was doing well until he recently started having seizures.  He is unsure whether or not he ever fell on the left shoulder but he notes increased pain since the seizures began.  The pain he has is to the clavicle radiating down into the elbow.  He does note weakness and tremors to the left hand.  No neck pain.  The pain he has is worse with shoulder range of  motion.  Review of Systems as detailed in HPI.  All others negative.   Objective: Vital Signs: There were no vitals taken for this visit.  Physical Exam well-developed well-nourished gentleman in no acute distress.  Alert and oriented x3.  Ortho Exam examination of his left upper extremity reveals mild tenderness to the clavicle.  75% active range of motion of the left shoulder.  Positive empty can.  He is neurovascularly intact distally.  Specialty Comments:  No specialty comments available.  Imaging: Xr Clavicle Left  Result Date: 01/11/2018 No structural or acute abnormalities.  Well aligned hardware with a healed fracture  Xr Shoulder Left  Result Date: 01/11/2018 No acute or structural abnormalities    PMFS History: Patient Active Problem List   Diagnosis Date Noted  . Chronic left shoulder pain 01/11/2018  . Pain of left clavicle 01/11/2018  . Alcohol withdrawal seizure ([place]) 12/31/2017  . Abdominal pain 12/31/2017  . Alcohol withdrawal seizure without complication ([place])   . Melena 12/12/2017  . Emesis 10/20/2017  . Displaced fracture of lateral end of left clavicle, initial encounter for closed fracture 08/05/2017  . Coracoclavicular (ligament) sprain and strain, left, initial encounter 08/05/2017  . Closed dislocation of acromioclavicular joint, initial encounter 08/05/2017  . Insomnia   . Hypertension   .  Severe episode of recurrent major depressive disorder, without psychotic features ([place])   . Depression, major, severe recurrence ([place]) 12/30/2015  . Substance induced mood disorder ([place]) 12/02/2015  . Mood disorder in conditions classified elsewhere   . Malnutrition of moderate degree 09/24/2015  . Alcohol withdrawal ([place]) 09/23/2015  . Tobacco use disorder 07/16/2015  . Elevated transaminase level   . Drug overdose, intentional ([place]) 07/12/2015  . Alcohol use disorder, severe, dependence ([place]) 04/15/2015  . Cocaine abuse with cocaine-induced mood disorder  ([place]) 04/11/2015  . Overdose 04/10/2015  . Severe recurrent major depressive disorder with psychotic features ([place])   . Severe major depression with psychotic features ([place]) 09/11/2014  . Alcohol-induced mood disorder ([place]) 09/10/2014  . Suicidal ideation   . Tylenol overdose   . Polysubstance abuse ([place])   . Overdose of acetaminophen 08/03/2014  . Overdose by acetaminophen 08/03/2014  . Cocaine abuse ([place]) 04/16/2014  . Thrombocytopenia ([place]) 04/15/2014  . Urinary tract infection, site not specified 04/15/2014  . Chest pain, unspecified 04/15/2014  . D-dimer, elevated 04/15/2014  . Transaminitis 09/24/2013  . Cocaine dependence ([place]) 09/20/2013  . S/p nephrectomy 04/28/2013  . Seizure ([place]) 03/15/2013  . Syncope 01/02/2013  . Leukocytopenia, unspecified 01/02/2013  . Left kidney mass 12/24/2012  . PTSD (post-traumatic stress disorder) 07/06/2012  . Episode of recurrent major depressive disorder ([place]) 06/29/2012    Class: Acute  . Peripheral vascular disease ([place]) 01/14/2012  . Alcohol abuse 10/13/2011  . SEIZURE DISORDER 10/03/2008  . Elevated LFTs 12/29/2007  . HYPERCHOLESTEROLEMIA 03/21/2007  . Essential hypertension 03/21/2007   Past Medical History:  Diagnosis Date  . Angina   . Anxiety    panic attack  . Bipolar 1 disorder ([place])   . Breast CA ([place]) dx'd 2009   bil w/ bil masectomy and oral meds  . Cancer [facility])    kidney cancer  . Coronary artery disease   . Depression   . H/O suicide attempt 2015   overdose  . Headache(784.0)   . Hypercholesteremia   . Hypertension   . Liver cirrhosis ([place])   . Pancreatitis   . Peripheral vascular disease [facility]) April 2011   Left Pop  . Schizophrenia ([place])   . Seizures ([place])    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  . Shortness of breath     Family History  Problem Relation Age of Onset  . Stroke Other   . Cancer Other   . Hyperlipidemia Mother   . Hypertension Mother     Past Surgical History:  Procedure Laterality  Date  . BREAST SURGERY    . BREAST SURGERY     bilateral breast silocone  removal  . CHEST SURGERY    . left kidney removal    . left leg surgery     "popiteal artery clogged"  . MASTECTOMY Bilateral   . NEPHRECTOMY Left   . ORIF CLAVICULAR FRACTURE Left 08/10/2017   Procedure: OPEN REDUCTION INTERNAL FIXATION (ORIF) LEFT CLAVICLE FRACTURE WITH RECONSTRUCTION OF CORACOCLAVICULAR LIGAMENT;  Surgeon: Leandrew Koyanagi, MD;  Location: [facility];  Service: Orthopedics;  Laterality: Left;  . RECONSTRUCTION OF CORACOCLAVICULAR LIGAMENT Left 08/10/2017   Procedure: RECONSTRUCTION OF CORACOCLAVICULAR LIGAMENT;  Surgeon: Leandrew Koyanagi, MD;  Location: [facility];  Service: Orthopedics;  Laterality: Left;   Social History   Occupational History  . Not on file  Tobacco Use  . Smoking status: Former Research scientist (life sciences)  . Smokeless tobacco: Never Used  . Tobacco comment: smoked on and off 16  years- quit 2016  Substance and Sexual Activity  . Alcohol use: Yes    Comment: 5 bottle of wine daily, last consumption 1 hr PTA  . Drug use: No    Frequency: 1.0 times per week    Types: "Crack" cocaine, Cocaine    Comment: clean 3 weeks  . Sexual activity: Yes    Birth control/protection: Condom    Comment: anal

## 2018-01-11 NOTE — Progress Notes (Signed)
xr

## 2018-01-12 MED ORDER — LIDOCAINE HCL 2 % IJ SOLN
2.0000 mL | INTRAMUSCULAR | Status: AC | PRN
  Administered 2018-01-11: 2 mL

## 2018-01-12 MED ORDER — BUPIVACAINE HCL 0.25 % IJ SOLN
2.0000 mL | INTRAMUSCULAR | Status: AC | PRN
  Administered 2018-01-11: 2 mL via INTRA_ARTICULAR
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
## 2018-04-28 ENCOUNTER — Encounter (HOSPITAL_COMMUNITY): Payer: Self-pay | Admitting: *Deleted

## 2018-04-28 ENCOUNTER — Emergency Department (HOSPITAL_COMMUNITY)
Admission: EM | Admit: 2018-04-28 | Discharge: 2018-04-29 | Disposition: A | Discharge status: Home / Self Care | Payer: Self-pay | Attending: Emergency Medicine | Admitting: Emergency Medicine

## 2018-04-28 ENCOUNTER — Other Ambulatory Visit: Payer: Self-pay

## 2018-04-28 DIAGNOSIS — I2510 Atherosclerotic heart disease of native coronary artery without angina pectoris: Secondary | ICD-10-CM | POA: Insufficient documentation

## 2018-04-28 DIAGNOSIS — K7460 Unspecified cirrhosis of liver: Secondary | ICD-10-CM | POA: Insufficient documentation

## 2018-04-28 DIAGNOSIS — Z79899 Other long term (current) drug therapy: Secondary | ICD-10-CM | POA: Insufficient documentation

## 2018-04-28 DIAGNOSIS — R1110 Vomiting, unspecified: Secondary | ICD-10-CM | POA: Insufficient documentation

## 2018-04-28 DIAGNOSIS — R197 Diarrhea, unspecified: Secondary | ICD-10-CM

## 2018-04-28 DIAGNOSIS — G40909 Epilepsy, unspecified, not intractable, without status epilepticus: Secondary | ICD-10-CM | POA: Insufficient documentation

## 2018-04-28 DIAGNOSIS — Z87891 Personal history of nicotine dependence: Secondary | ICD-10-CM | POA: Insufficient documentation

## 2018-04-28 DIAGNOSIS — I10 Essential (primary) hypertension: Secondary | ICD-10-CM | POA: Insufficient documentation

## 2018-04-28 DIAGNOSIS — F1010 Alcohol abuse, uncomplicated: Secondary | ICD-10-CM | POA: Insufficient documentation

## 2018-04-28 DIAGNOSIS — Z905 Acquired absence of kidney: Secondary | ICD-10-CM | POA: Insufficient documentation

## 2018-04-28 LAB — CBC
HCT: 46.1 % (ref 39.0–52.0)
HEMOGLOBIN: 15.2 g/dL (ref 13.0–17.0)
MCH: 29.3 pg (ref 26.0–34.0)
MCHC: 33 g/dL (ref 30.0–36.0)
MCV: 89 fL (ref 80.0–100.0)
Platelets: 196 10*3/uL (ref 150–400)
RBC: 5.18 MIL/uL (ref 4.22–5.81)
RDW: 15.5 % (ref 11.5–15.5)
WBC: 9.5 10*3/uL (ref 4.0–10.5)
nRBC: 0 % (ref 0.0–0.2)

## 2018-04-28 NOTE — ED Triage Notes (Signed)
Per [facility], pt c/o n/v/d x 2 days.  Received Zofran 4 mg IV PTA.

## 2018-04-29 LAB — URINALYSIS, ROUTINE W REFLEX MICROSCOPIC
Bilirubin Urine: NEGATIVE
GLUCOSE, UA: NEGATIVE mg/dL
HGB URINE DIPSTICK: NEGATIVE
Ketones, ur: 80 mg/dL — AB
Leukocytes, UA: NEGATIVE
Nitrite: NEGATIVE
PROTEIN: NEGATIVE mg/dL
Specific Gravity, Urine: 1.02 (ref 1.005–1.030)
pH: 5 (ref 5.0–8.0)

## 2018-04-29 LAB — COMPREHENSIVE METABOLIC PANEL
ALT: 17 U/L (ref 0–44)
AST: 42 U/L — AB (ref 15–41)
Albumin: 3.9 g/dL (ref 3.5–5.0)
Alkaline Phosphatase: 41 U/L (ref 38–126)
Anion gap: 17 — ABNORMAL HIGH (ref 5–15)
BILIRUBIN TOTAL: 0.6 mg/dL (ref 0.3–1.2)
BUN: 13 mg/dL (ref 6–20)
CO2: 20 mmol/L — ABNORMAL LOW (ref 22–32)
CREATININE: 0.77 mg/dL (ref 0.61–1.24)
Calcium: 8.3 mg/dL — ABNORMAL LOW (ref 8.9–10.3)
Chloride: 105 mmol/L (ref 98–111)
GFR calc Af Amer: >60 mL/min (ref >60)
GLUCOSE: 77 mg/dL (ref 70–99)
Potassium: 4.6 mmol/L (ref 3.5–5.1)
Sodium: 142 mmol/L (ref 135–145)
TOTAL PROTEIN: 6.8 g/dL (ref 6.5–8.1)

## 2018-04-29 LAB — LIPASE, BLOOD: Lipase: 22 U/L (ref 11–51)

## 2018-04-29 IMAGING — DX DG CHEST 2V
2 series · 2 of 2 positions shown · non-contrast
Comparison: 10/07/2015

CLINICAL DATA: Chest pain with vomiting starting this morning.

EXAM:
CHEST  2 VIEW

[w chest pa]
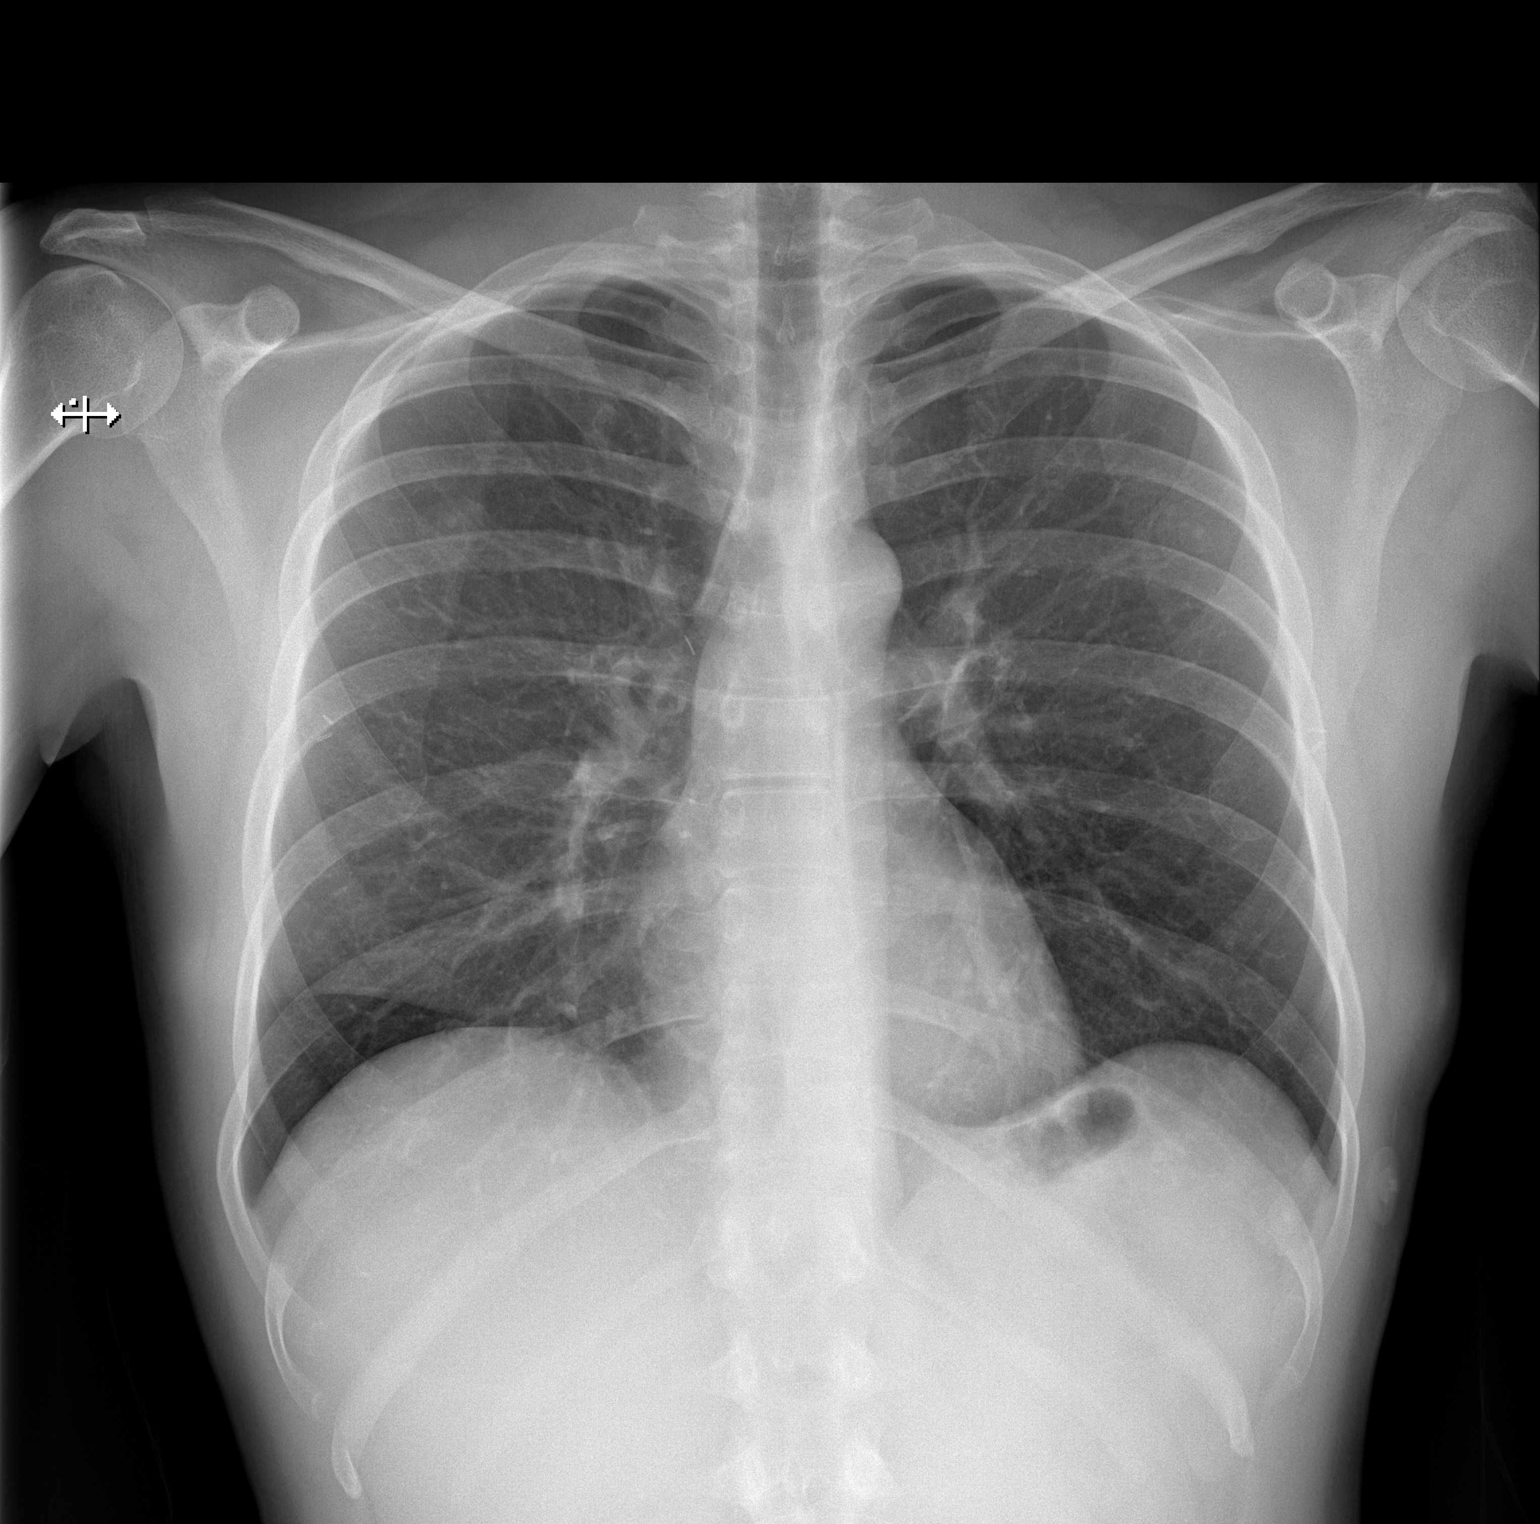

[w chest lat]
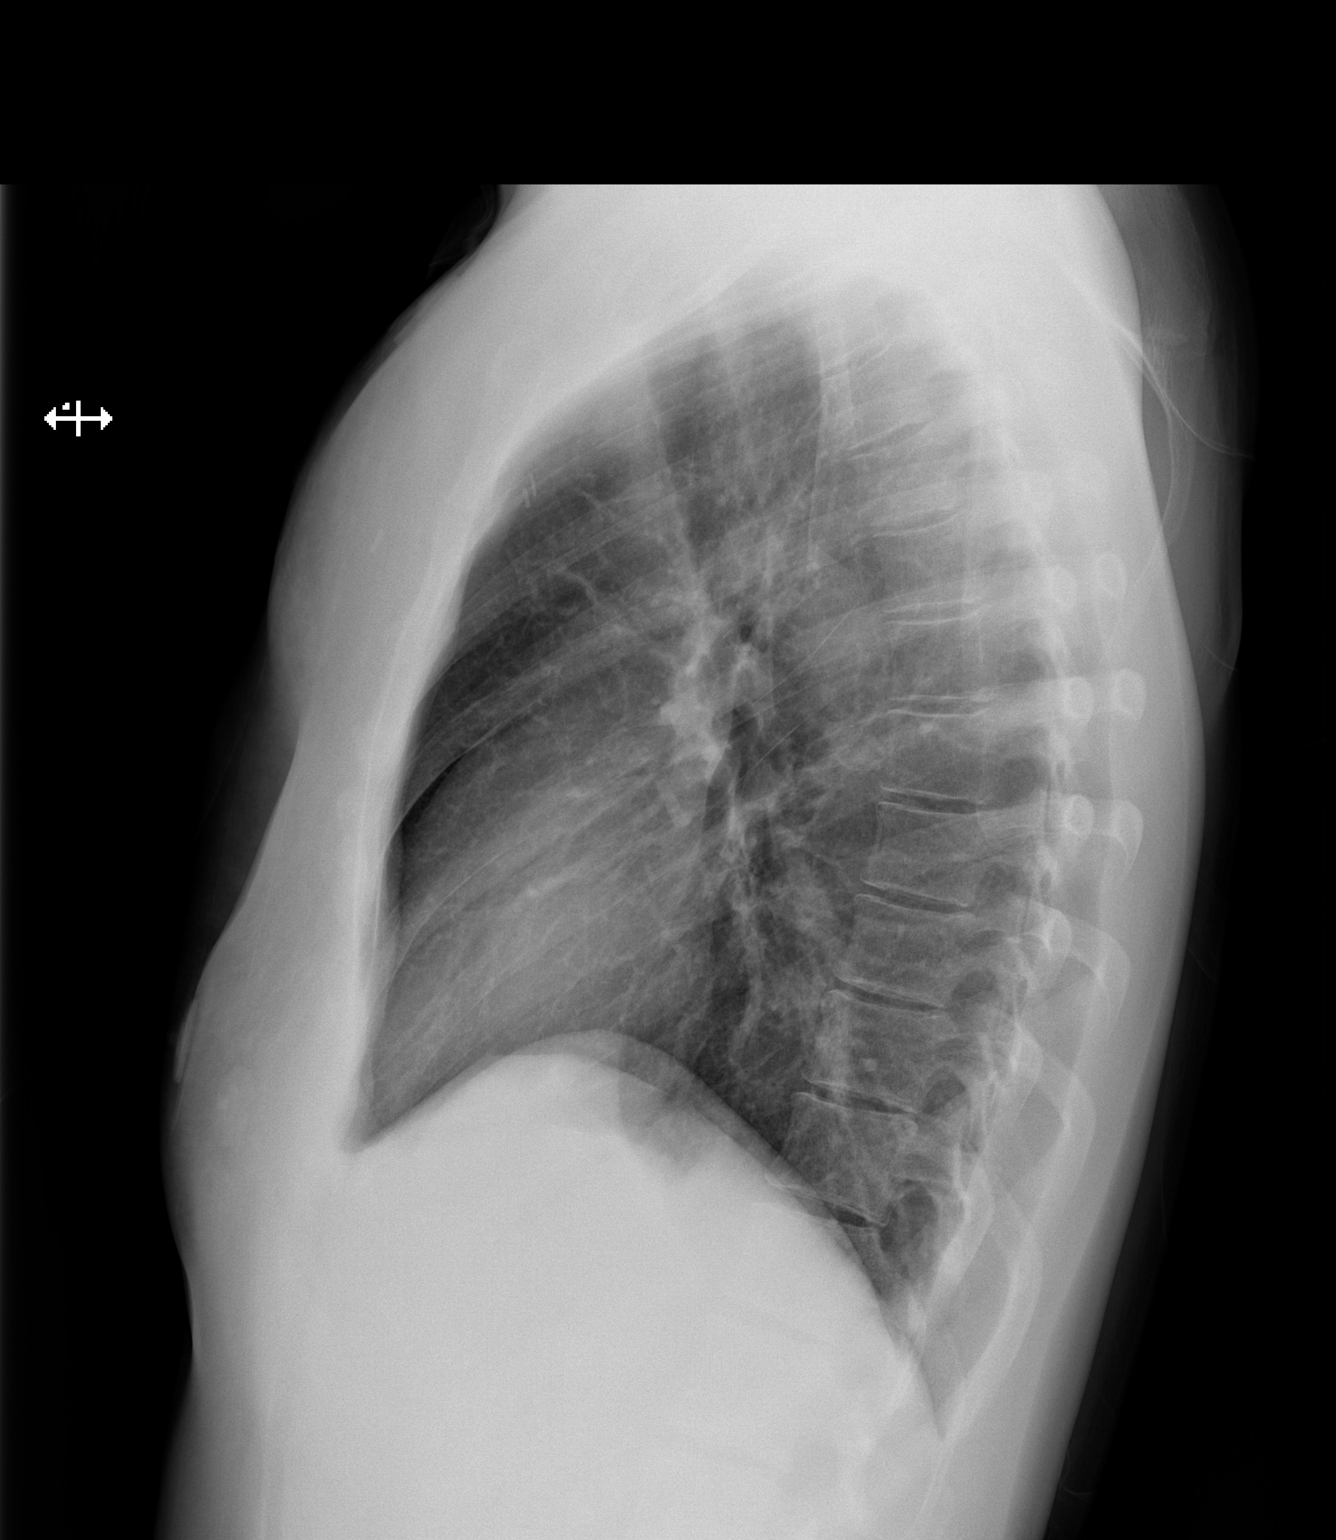

[2 of 2 positions shown; findings below may reference images not displayed]

FINDINGS: Bilateral axillary clips. Postoperative findings along the anterior
chest wall.

The lungs appear clear. Cardiac and mediastinal contours normal. No
pleural effusion identified.
IMPRESSION: 1.  No active cardiopulmonary disease is radiographically apparent.

## 2018-04-29 MED ORDER — SODIUM CHLORIDE 0.9 % IV BOLUS (SEPSIS)
1000.0000 mL | Freq: Once | INTRAVENOUS | Status: AC
  Administered 2018-04-29: 1000 mL via INTRAVENOUS

## 2018-04-29 MED ORDER — LORAZEPAM 2 MG/ML IJ SOLN
1.0000 mg | Freq: Once | INTRAMUSCULAR | Status: AC
  Administered 2018-04-29: 1 mg via INTRAVENOUS
  Filled 2018-04-29: qty 1

## 2018-04-29 MED ORDER — ONDANSETRON HCL 4 MG/2ML IJ SOLN
4.0000 mg | Freq: Once | INTRAMUSCULAR | Status: AC
  Administered 2018-04-29: 4 mg via INTRAVENOUS
  Filled 2018-04-29: qty 2

## 2018-04-29 MED ORDER — ACETAMINOPHEN 325 MG PO TABS
650.0000 mg | ORAL_TABLET | Freq: Once | ORAL | Status: AC
  Administered 2018-04-29: 650 mg via ORAL
  Filled 2018-04-29: qty 2

## 2018-04-29 MED ORDER — KETOROLAC TROMETHAMINE 30 MG/ML IJ SOLN
30.0000 mg | Freq: Once | INTRAMUSCULAR | Status: AC
  Administered 2018-04-29: 30 mg via INTRAVENOUS
  Filled 2018-04-29: qty 1

## 2018-04-29 NOTE — ED Provider Notes (Signed)
[facility] DEPT Provider Note   CSN: [id number] Arrival date & time: 04/28/18  2258     History   Chief Complaint Chief Complaint  Patient presents with  . Emesis  . Dizziness    HPI [name] is a 37 y.o. male.  The history is provided by the patient.  Emesis   This is a new problem. The current episode started 2 days ago. The problem has been gradually worsening. Associated symptoms include abdominal pain, chills and diarrhea.  Dizziness  Quality:  Lightheadedness Severity:  Moderate Onset quality:  Gradual Timing:  Intermittent Progression:  Worsening Chronicity:  New Relieved by:  Nothing Worsened by:  Nothing Associated symptoms: diarrhea and vomiting   Associated symptoms: no blood in stool, no chest pain and no shortness of breath   Patient with history of anxiety, bipolar, seizures presents with multiple complaints.  He reports he had a seizure about 2 days ago.  He reports he takes Lamictal, his last seizure was about 2 months ago.  He reports since that time is been having vomiting and diarrhea, both nonbloody.  He also reports feeling lightheaded.  No trauma or injuries from the recent seizure.  No chest pain or shortness of breath.  He does endorse abdominal pain.  He does admit to recent alcohol use, and he reports smoking crack cocaine last week.  Past Medical History:  Diagnosis Date  . Angina   . Anxiety    panic attack  . Bipolar 1 disorder ([place])   . Breast CA ([place]) dx'd 2009   bil w/ bil masectomy and oral meds  . Cancer [facility])    kidney cancer  . Coronary artery disease   . Depression   . H/O suicide attempt 2015   overdose  . Headache(784.0)   . Hypercholesteremia   . Hypertension   . Liver cirrhosis ([place])   . Pancreatitis   . Peripheral vascular disease [facility]) April 2011   Left Pop  . Schizophrenia ([place])   . Seizures ([place])    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  . Shortness of breath     Patient Active  Problem List   Diagnosis Date Noted  . Chronic left shoulder pain 01/11/2018  . Pain of left clavicle 01/11/2018  . Alcohol withdrawal seizure ([place]) 12/31/2017  . Abdominal pain 12/31/2017  . Alcohol withdrawal seizure without complication ([place])   . Melena 12/12/2017  . Emesis 10/20/2017  . Displaced fracture of lateral end of left clavicle, initial encounter for closed fracture 08/05/2017  . Coracoclavicular (ligament) sprain and strain, left, initial encounter 08/05/2017  . Closed dislocation of acromioclavicular joint, initial encounter 08/05/2017  . Insomnia   . Hypertension   . Severe episode of recurrent major depressive disorder, without psychotic features ([place])   . Depression, major, severe recurrence ([place]) 12/30/2015  . Substance induced mood disorder ([place]) 12/02/2015  . Mood disorder in conditions classified elsewhere   . Malnutrition of moderate degree 09/24/2015  . Alcohol withdrawal ([place]) 09/23/2015  . Tobacco use disorder 07/16/2015  . Elevated transaminase level   . Drug overdose, intentional ([place]) 07/12/2015  . Alcohol use disorder, severe, dependence ([place]) 04/15/2015  . Cocaine abuse with cocaine-induced mood disorder ([place]) 04/11/2015  . Overdose 04/10/2015  . Severe recurrent major depressive disorder with psychotic features ([place])   . Severe major depression with psychotic features ([place]) 09/11/2014  . Alcohol-induced mood disorder ([place]) 09/10/2014  . Suicidal ideation   . Tylenol overdose   . Polysubstance  abuse ([place])   . Overdose of acetaminophen 08/03/2014  . Overdose by acetaminophen 08/03/2014  . Cocaine abuse ([place]) 04/16/2014  . Thrombocytopenia ([place]) 04/15/2014  . Urinary tract infection, site not specified 04/15/2014  . Chest pain, unspecified 04/15/2014  . D-dimer, elevated 04/15/2014  . Transaminitis 09/24/2013  . Cocaine dependence ([place]) 09/20/2013  . S/p nephrectomy 04/28/2013  . Seizure ([place]) 03/15/2013  . Syncope 01/02/2013  . Leukocytopenia,  unspecified 01/02/2013  . Left kidney mass 12/24/2012  . PTSD (post-traumatic stress disorder) 07/06/2012  . Episode of recurrent major depressive disorder ([place]) 06/29/2012    Class: Acute  . Peripheral vascular disease ([place]) 01/14/2012  . Alcohol abuse 10/13/2011  . SEIZURE DISORDER 10/03/2008  . Elevated LFTs 12/29/2007  . HYPERCHOLESTEROLEMIA 03/21/2007  . Essential hypertension 03/21/2007    Past Surgical History:  Procedure Laterality Date  . BREAST SURGERY    . BREAST SURGERY     bilateral breast silocone  removal  . CHEST SURGERY    . left kidney removal    . left leg surgery     "popiteal artery clogged"  . MASTECTOMY Bilateral   . NEPHRECTOMY Left   . ORIF CLAVICULAR FRACTURE Left 08/10/2017   Procedure: OPEN REDUCTION INTERNAL FIXATION (ORIF) LEFT CLAVICLE FRACTURE WITH RECONSTRUCTION OF CORACOCLAVICULAR LIGAMENT;  Surgeon: Leandrew Koyanagi, MD;  Location: [facility];  Service: Orthopedics;  Laterality: Left;  . RECONSTRUCTION OF CORACOCLAVICULAR LIGAMENT Left 08/10/2017   Procedure: RECONSTRUCTION OF CORACOCLAVICULAR LIGAMENT;  Surgeon: Leandrew Koyanagi, MD;  Location: [facility];  Service: Orthopedics;  Laterality: Left;        Home Medications    Prior to Admission medications   Medication Sig Start Date End Date Taking? Authorizing Provider  amLODipine (NORVASC) 5 MG tablet Take 1 tablet (5 mg total) by mouth daily. 01/03/18   Eugenie Filler, MD  busPIRone (BUSPAR) 10 MG tablet Take 1 tablet (10 mg total) by mouth 3 (three) times daily. 01/03/18   Eugenie Filler, MD  folic acid (FOLVITE) 1 MG tablet Take 1 tablet (1 mg total) by mouth daily. 01/04/18   Eugenie Filler, MD  Ibuprofen 200 MG CAPS Take 400 mg by mouth every 8 (eight) hours as needed (pain).     [provider]  Multiple Vitamin (MULTIVITAMIN WITH MINERALS) TABS tablet Take 1 tablet by mouth daily. 01/04/18   Eugenie Filler, MD  pantoprazole (PROTONIX) 40 MG tablet Take 1 tablet (40 mg total) by  mouth daily at 6 (six) AM. 01/04/18   Eugenie Filler, MD  QUEtiapine (SEROQUEL) 100 MG tablet Take 2.5 tablets (250 mg total) by mouth at bedtime. 01/03/18   Eugenie Filler, MD  thiamine 100 MG tablet Take 1 tablet (100 mg total) by mouth daily. 01/04/18   Eugenie Filler, MD  ziprasidone (GEODON) 20 MG capsule Take 20 mg by mouth daily.    [provider]    Family History Family History  Problem Relation Age of Onset  . Stroke Other   . Cancer Other   . Hyperlipidemia Mother   . Hypertension Mother     Social History Social History   Tobacco Use  . Smoking status: Former Research scientist (life sciences)  . Smokeless tobacco: Never Used  . Tobacco comment: smoked on and off 16 years- quit 2016  Substance Use Topics  . Alcohol use: Yes    Comment: 5 bottle of wine daily, last consumption 1 hr PTA  . Drug use: No    Frequency:  1.0 times per week    Types: "Crack" cocaine, Cocaine    Comment: clean 3 weeks     Allergies   Codeine; Penicillins; Morphine; Coconut flavor; Coconut oil; Grapefruit concentrate; Morphine and related; Oxycodone; and Norco [hydrocodone-acetaminophen]   Review of Systems Review of Systems  Constitutional: Positive for chills.  Respiratory: Negative for shortness of breath.   Cardiovascular: Negative for chest pain.  Gastrointestinal: Positive for abdominal pain, diarrhea and vomiting. Negative for blood in stool.  Neurological: Positive for dizziness and seizures.  All other systems reviewed and are negative.    Physical Exam Updated Vital Signs BP (!) 133/91   Pulse 79   Temp 97.9 F (36.6 C) (Oral)   Resp 16   Ht 1.676 m (5\' 6" )   Wt 68 kg   SpO2 97%   BMI 24.21 kg/m   Physical Exam  CONSTITUTIONAL: Well developed/well nourished HEAD: Normocephalic/atraumatic EYES: EOMI/PERRL ENMT: Mucous membranes dry NECK: supple no meningeal signs SPINE/BACK:entire spine nontender CV: S1/S2 noted, no murmurs/rubs/gallops noted LUNGS: Lungs are  clear to auscultation bilaterally, no apparent distress ABDOMEN: soft, nontender, no rebound or guarding, bowel sounds noted throughout abdomen GU:no cva tenderness NEURO: Pt is awake/alert/appropriate, moves all extremitiesx4.  No facial droop.  No arm or leg drift EXTREMITIES: pulses normal/equal, full ROM SKIN: warm, color normal PSYCH: Mildly anxious  ED Treatments / Results  Labs (all labs ordered are listed, but only abnormal results are displayed) Labs Reviewed  COMPREHENSIVE METABOLIC PANEL - Abnormal; Notable for the following components:      Result Value   CO2 20 (*)    Calcium 8.3 (*)    AST 42 (*)    Anion gap 17 (*)    All other components within normal limits  URINALYSIS, ROUTINE W REFLEX MICROSCOPIC - Abnormal; Notable for the following components:   Ketones, ur 80 (*)    All other components within normal limits  LIPASE, BLOOD  CBC    EKG EKG Interpretation  Date/Time:  Saturday April 29 2018 00:18:33 EDT Ventricular Rate:  80 PR Interval:  114 QRS Duration: 88 QT Interval:  430 QTC Calculation: 495 R Axis:   72 Text Interpretation:  Sinus rhythm with occasional Premature ventricular complexes Cannot rule out Anterior infarct , age undetermined Abnormal ECG Confirmed by Ripley Fraise [id number]) on 04/29/2018 12:41:55 AM   Radiology No results found.  Procedures Procedures    Medications Ordered in ED Medications  sodium chloride 0.9 % bolus 1,000 mL (0 mLs Intravenous Stopped 04/29/18 0134)  ondansetron (ZOFRAN) injection 4 mg (4 mg Intravenous Given 04/29/18 0035)  ketorolac (TORADOL) 30 MG/ML injection 30 mg (30 mg Intravenous Given 04/29/18 0035)  sodium chloride 0.9 % bolus 1,000 mL (0 mLs Intravenous Stopped 04/29/18 0232)  acetaminophen (TYLENOL) tablet 650 mg (650 mg Oral Given 04/29/18 0244)  ondansetron (ZOFRAN) injection 4 mg (4 mg Intravenous Given 04/29/18 0315)  LORazepam (ATIVAN) injection 1 mg (1 mg Intravenous Given 04/29/18  0315)     Initial Impression / Assessment and Plan / ED Course  I have reviewed the triage vital signs and the nursing notes.  Pertinent labs   results that were available during my care of the patient were reviewed by me and considered in my medical decision making (see chart for details).     PT presented with multiple complaints.  He reported recent seizure, but he is on medications for this.  Also reports vomiting and diarrhea.  He has been given IV fluids, he  is taking p.o. Fluids. He also feels that he may be withdrawal from alcohol, he has been binge drinking etoh for 2 weeks.  He was given Zofran for nausea and Ativan.  Patient is otherwise stable, no significant tremors, he is ambulatory. Will discharge home Given outpatient resources  Final Clinical Impressions(s) / ED Diagnoses   Final diagnoses:  Vomiting and diarrhea  Alcohol abuse    ED Discharge Orders    None       Ripley Fraise, MD 04/29/18 [phone]

## 2018-04-29 NOTE — ED Notes (Signed)
ED Provider at bedside. 

## 2018-04-29 NOTE — Discharge Instructions (Addendum)
Substance Abuse Treatment Programs ° °Intensive Outpatient Programs °[facility]     °[address]      °[place], [place]                   °[phone]      ° °[facility] °[address] °[place], [place] °[phone] ° °[facility]     °(Inpatient and outpatient)     °[address]           °[phone]   ° °[facility] °[phone] (Suboxone and Methadone) ° °[address]      °[place], [place] [address]      °[phone]      ° °[address] °[place], [place] °[phone] ° °[facility] (Outpatient/Inpatient, [facility])    °(insurance only) [phone]      °       °[facility] (Groups & Residential) °[place], [place] °[phone] ° °   °[facility]     °[address]     °[place], [place]      °[phone]      ° °[facility] (for caregivers and family) °[address] °[place], [place] °[phone] ° ° ° ° ° °Residential Treatment Programs °[facility]      °[address], [place], [place] [address]  °[phone]      ° °[facility] °[address] [place], [place] [address] °[phone] ° °[facility]        °[phone]      ° °[facility] °[phone] ° °[facility]             °[address]                                         °[place], [place]                                                °[phone] or [phone]                              ° °[facility] [place] °[address] °[place], [address] °[phone] ° °[facility]    °[name]      °[place], [place]     °[phone]      ° °[facility] °[address] °[place], [place] °[phone] ° °[facility]   °[address]     °[place], [place] [address]     °[phone]      °Admissions: 8am-3pm M-F ° °[facility] °[address] °[place] °[phone] ° °[facility]: Residential Program (90 Days)   °[place], [place]      °336-725-8389 or [phone]    ° °[facility] °[place], [place] °(Walk in Hours over the weekend or by referral) ° °[facility] °[address], [place], [place] [address] °[phone] ° °Crisis Mobile: [facility]:  1-877-626-1772 (for crisis response 24 hours a day) °[facility] Hotline:      1-800-256-2452 °Outpatient Psychiatry and Counseling ° °[facility]: Mobile Crisis   Management 24 hours:  1-877-626-1772 ° °[facility] sliding scale fee and walk in schedule: M-F 8am-12pm/1pm-3pm °[address]  °[place], [place] [address] °[phone] ° °[facility] °[address] °[place], [place] [address] °[phone] ° °[facility] (Formerly known [facility])- new patient walk-in appointments available Monday - Friday 8am -3pm.          °[address] °[place], [place] [address] °336-676-6840 or crisis line- 336-676-6905 ° °[facility]/ Intensive Outpatient Therapy Program °[address] °[place], [place] [address] °[phone] ° °[facility]                  °Crisis Services      °[phone]      °[address]     °[place], [place] [address]                ° °[facility]   °[facility] °[phone] °[address] °[place], [place] [address] ° ° °[facility]          °[name]  °[place], [place] [address]       °[phone] ° °[facility] °[address] °[place], [place] [address] °336-292-1510 ° °[facility]    °[address]    °[place], [place] [address]     °[phone]      ° °Parish McKinney, MD     °[address]     °[place] [address]     °[phone]     °  °[facility] °[address] °[place] [address] ° °[facility]     °[address]     °[place], [place]      °[phone]      ° °[facility] °Shamsher Ahluwalia, MD °[address] °[place], [place] [address] °[phone] ° °[facility]     °[address]     °[place], [place] [address]     °336-274-1237      ° °[facility] °[address] °[place], [place] [address] °336-854-4450 °Resources for Temporary Residential Assistance/Crisis Centers ° °DAY CENTERS °[facility] °M-F 8am-3pm   °[address] [place], [place] [address]   336-332-0824 °Services include: laundry, barbering, support groups, case management, phone  & computer access, showers, AA/NA mtgs, mental health/substance abuse nurse, job skills class, disability information, VA assistance, spiritual classes, etc.  ° °HOMELESS SHELTERS ° °[facility]     °[facility]   °[address], [place]     °[phone]       °       °[facility] (women and children)       °[address] °[place], [place] [address] °336-275-0820 °[email or web address] for application and process °Application Required ° °[facility]   °[address]    °[place] [address]     °[phone]       °             °[facility] °[address] °[place], [place] [address] °336.273.5572 °336-235-0363(schedule application appt.) °Application Required ° °[facility] (women only)    °[address]     °[place], [place] [address]     °336-884-1039      °  Intake starts 6pm daily °Need valid ID, SSC, & Police report °[facility] [place] °[address] °[place], [place] °336-881-5420 °Application Required ° °[facility] (men only)     °[address]      °[place], [place]     °[phone]      ° °[facility] °(Pregnant women only) °[address] °[place], [place] °336-275-0206 ° °[facility]      °[address]      °[place], [place] [address]     °[phone]      °       °[facility] °[address] °[place], [place] °336-723-1848 °90 day commitment/SA/Application process ° °[facility](men only)     °[address]     °[place], [place]     °[phone]       °Check-in at 7pm     °       °[facility] °[address] °[place], [place] [address] °336-248-6684 °Men/Women/Women and Children must be there by 7 pm ° °[facility] °[place],  °336-722-8721                ° °

## 2018-04-29 NOTE — ED Notes (Signed)
IV hydration complete. Pt has tolerated sprite without vomiting. Pt ambulated in halls with steady gait, said he felt less dizzy, primarily has a headache at this time. VSS.

## 2018-06-06 ENCOUNTER — Emergency Department (HOSPITAL_COMMUNITY)
Admission: EM | Admit: 2018-06-06 | Discharge: 2018-06-06 | Disposition: A | Discharge status: Home / Self Care | Payer: Self-pay | Attending: Emergency Medicine | Admitting: Emergency Medicine

## 2018-06-06 ENCOUNTER — Other Ambulatory Visit: Payer: Self-pay

## 2018-06-06 DIAGNOSIS — Z87891 Personal history of nicotine dependence: Secondary | ICD-10-CM | POA: Insufficient documentation

## 2018-06-06 DIAGNOSIS — F1020 Alcohol dependence, uncomplicated: Secondary | ICD-10-CM | POA: Insufficient documentation

## 2018-06-06 DIAGNOSIS — Z79899 Other long term (current) drug therapy: Secondary | ICD-10-CM | POA: Insufficient documentation

## 2018-06-06 DIAGNOSIS — I10 Essential (primary) hypertension: Secondary | ICD-10-CM | POA: Insufficient documentation

## 2018-06-06 DIAGNOSIS — F1010 Alcohol abuse, uncomplicated: Secondary | ICD-10-CM

## 2018-06-06 DIAGNOSIS — I2510 Atherosclerotic heart disease of native coronary artery without angina pectoris: Secondary | ICD-10-CM | POA: Insufficient documentation

## 2018-06-06 LAB — COMPREHENSIVE METABOLIC PANEL
ALBUMIN: 4.1 g/dL (ref 3.5–5.0)
ALK PHOS: 47 U/L (ref 38–126)
ALT: 53 U/L — AB (ref 0–44)
ANION GAP: 18 — AB (ref 5–15)
AST: 68 U/L — AB (ref 15–41)
BUN: 11 mg/dL (ref 6–20)
CALCIUM: 8.9 mg/dL (ref 8.9–10.3)
CO2: 19 mmol/L — AB (ref 22–32)
Chloride: 106 mmol/L (ref 98–111)
Creatinine, Ser: 0.95 mg/dL (ref 0.61–1.24)
GFR calc Af Amer: >60 mL/min (ref >60)
GFR calc non Af Amer: >60 mL/min (ref >60)
GLUCOSE: 102 mg/dL — AB (ref 70–99)
Potassium: 3.8 mmol/L (ref 3.5–5.1)
SODIUM: 143 mmol/L (ref 135–145)
Total Bilirubin: 1.1 mg/dL (ref 0.3–1.2)
Total Protein: 7.1 g/dL (ref 6.5–8.1)

## 2018-06-06 LAB — URINALYSIS, ROUTINE W REFLEX MICROSCOPIC
Bacteria, UA: NONE SEEN
Bilirubin Urine: NEGATIVE
Glucose, UA: NEGATIVE mg/dL
Hgb urine dipstick: NEGATIVE
KETONES UR: 5 mg/dL — AB
Leukocytes, UA: NEGATIVE
Nitrite: NEGATIVE
PH: 9 — AB (ref 5.0–8.0)
PROTEIN: 30 mg/dL — AB
Specific Gravity, Urine: 1.016 (ref 1.005–1.030)

## 2018-06-06 LAB — CBC
HCT: 43.3 % (ref 39.0–52.0)
Hemoglobin: 14.9 g/dL (ref 13.0–17.0)
MCH: 30.6 pg (ref 26.0–34.0)
MCHC: 34.4 g/dL (ref 30.0–36.0)
MCV: 88.9 fL (ref 80.0–100.0)
PLATELETS: 134 10*3/uL — AB (ref 150–400)
RBC: 4.87 MIL/uL (ref 4.22–5.81)
RDW: 14.1 % (ref 11.5–15.5)
WBC: 4.5 10*3/uL (ref 4.0–10.5)
nRBC: 0 % (ref 0.0–0.2)

## 2018-06-06 LAB — LIPASE, BLOOD: Lipase: 43 U/L (ref 11–51)

## 2018-06-06 LAB — ETHANOL

## 2018-06-06 MED ORDER — LORAZEPAM 2 MG/ML IJ SOLN
1.0000 mg | Freq: Once | INTRAMUSCULAR | Status: AC
  Administered 2018-06-06: 1 mg via INTRAVENOUS
  Filled 2018-06-06: qty 1

## 2018-06-06 MED ORDER — SODIUM CHLORIDE 0.9 % IV BOLUS
1000.0000 mL | Freq: Once | INTRAVENOUS | Status: AC
  Administered 2018-06-06: 1000 mL via INTRAVENOUS

## 2018-06-06 MED ORDER — CHLORDIAZEPOXIDE HCL 25 MG PO CAPS: ORAL_CAPSULE | ORAL | 0 refills | Status: DC

## 2018-06-06 MED ORDER — METOCLOPRAMIDE HCL 5 MG/ML IJ SOLN
10.0000 mg | Freq: Once | INTRAMUSCULAR | Status: AC
  Administered 2018-06-06: 10 mg via INTRAVENOUS
  Filled 2018-06-06: qty 2

## 2018-06-06 MED ORDER — ONDANSETRON 8 MG PO TBDP: 8.0000 mg | ORAL_TABLET | Freq: Three times a day (TID) | ORAL | 0 refills | Status: DC | PRN

## 2018-06-06 MED ORDER — ONDANSETRON 4 MG PO TBDP
4.0000 mg | ORAL_TABLET | Freq: Once | ORAL | Status: AC | PRN
  Administered 2018-06-06: 4 mg via ORAL
  Filled 2018-06-06: qty 1

## 2018-06-06 NOTE — ED Provider Notes (Signed)
[place] DEPT Provider Note   CSN: [id number] Arrival date & time: 06/06/18  1237     History   Chief Complaint Chief Complaint  Patient presents with  . detox from alcohol  . possible seizure  . muscle cramping  . Abdominal Pain    HPI Francisca Harbuck is a 37 y.o. male.  HPI Patient is a 37 year old male presents the emergency department requesting detox from alcohol.  States his last drink was earlier today.  States he found himself lying on the ground and believes he may have had a seizure.  He states a history of alcohol withdrawal seizures before in the past.  Also reports nausea vomiting and some muscle cramping.  Denies abdominal pain.  No back pain.   Past Medical History:  Diagnosis Date  . Angina   . Anxiety    panic attack  . Bipolar 1 disorder ([place])   . Breast CA ([place]) dx'd 2009   bil w/ bil masectomy and oral meds  . Cancer [facility])    kidney cancer  . Coronary artery disease   . Depression   . H/O suicide attempt 2015   overdose  . Headache(784.0)   . Hypercholesteremia   . Hypertension   . Liver cirrhosis ([place])   . Pancreatitis   . Peripheral vascular disease [facility]) April 2011   Left Pop  . Schizophrenia ([place])   . Seizures ([place])    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  . Shortness of breath     Patient Active Problem List   Diagnosis Date Noted  . Chronic left shoulder pain 01/11/2018  . Pain of left clavicle 01/11/2018  . Alcohol withdrawal seizure ([place]) 12/31/2017  . Abdominal pain 12/31/2017  . Alcohol withdrawal seizure without complication ([place])   . Melena 12/12/2017  . Emesis 10/20/2017  . Displaced fracture of lateral end of left clavicle, initial encounter for closed fracture 08/05/2017  . Coracoclavicular (ligament) sprain and strain, left, initial encounter 08/05/2017  . Closed dislocation of acromioclavicular joint, initial encounter 08/05/2017  . Insomnia   . Hypertension   . Severe episode of  recurrent major depressive disorder, without psychotic features ([place])   . Depression, major, severe recurrence ([place]) 12/30/2015  . Substance induced mood disorder ([place]) 12/02/2015  . Mood disorder in conditions classified elsewhere   . Malnutrition of moderate degree 09/24/2015  . Alcohol withdrawal ([place]) 09/23/2015  . Tobacco use disorder 07/16/2015  . Elevated transaminase level   . Drug overdose, intentional ([place]) 07/12/2015  . Alcohol use disorder, severe, dependence ([place]) 04/15/2015  . Cocaine abuse with cocaine-induced mood disorder ([place]) 04/11/2015  . Overdose 04/10/2015  . Severe recurrent major depressive disorder with psychotic features ([place])   . Severe major depression with psychotic features ([place]) 09/11/2014  . Alcohol-induced mood disorder ([place]) 09/10/2014  . Suicidal ideation   . Tylenol overdose   . Polysubstance abuse ([place])   . Overdose of acetaminophen 08/03/2014  . Overdose by acetaminophen 08/03/2014  . Cocaine abuse ([place]) 04/16/2014  . Thrombocytopenia ([place]) 04/15/2014  . Urinary tract infection, site not specified 04/15/2014  . Chest pain, unspecified 04/15/2014  . D-dimer, elevated 04/15/2014  . Transaminitis 09/24/2013  . Cocaine dependence ([place]) 09/20/2013  . S/p nephrectomy 04/28/2013  . Seizure ([place]) 03/15/2013  . Syncope 01/02/2013  . Leukocytopenia, unspecified 01/02/2013  . Left kidney mass 12/24/2012  . PTSD (post-traumatic stress disorder) 07/06/2012  . Episode of recurrent major depressive disorder ([place]) 06/29/2012    Class: Acute  .  Peripheral vascular disease ([place]) 01/14/2012  . Alcohol abuse 10/13/2011  . SEIZURE DISORDER 10/03/2008  . Elevated LFTs 12/29/2007  . HYPERCHOLESTEROLEMIA 03/21/2007  . Essential hypertension 03/21/2007    Past Surgical History:  Procedure Laterality Date  . BREAST SURGERY    . BREAST SURGERY     bilateral breast silocone  removal  . CHEST SURGERY    . left kidney removal    . left leg surgery      "popiteal artery clogged"  . MASTECTOMY Bilateral   . NEPHRECTOMY Left   . ORIF CLAVICULAR FRACTURE Left 08/10/2017   Procedure: OPEN REDUCTION INTERNAL FIXATION (ORIF) LEFT CLAVICLE FRACTURE WITH RECONSTRUCTION OF CORACOCLAVICULAR LIGAMENT;  Surgeon: Leandrew Koyanagi, MD;  Location: [place];  Service: Orthopedics;  Laterality: Left;  . RECONSTRUCTION OF CORACOCLAVICULAR LIGAMENT Left 08/10/2017   Procedure: RECONSTRUCTION OF CORACOCLAVICULAR LIGAMENT;  Surgeon: Leandrew Koyanagi, MD;  Location: [place];  Service: Orthopedics;  Laterality: Left;        Home Medications    Prior to Admission medications   Medication Sig Start Date End Date Taking? Authorizing Provider  amLODipine (NORVASC) 5 MG tablet Take 1 tablet (5 mg total) by mouth daily. 01/03/18   Eugenie Filler, MD  busPIRone (BUSPAR) 10 MG tablet Take 1 tablet (10 mg total) by mouth 3 (three) times daily. 01/03/18   Eugenie Filler, MD  HYDROXYZINE HCL PO Take 1 tablet by mouth 2 (two) times daily as needed (anxiety).    [provider]  Ibuprofen 200 MG CAPS Take 400 mg by mouth every 8 (eight) hours as needed (pain).     [provider]  traZODone (DESYREL) 150 MG tablet Take 250 mg by mouth at bedtime.    [provider]  ziprasidone (GEODON) 20 MG capsule Take 20 mg by mouth 2 (two) times daily with a meal.     [provider]    Family History Family History  Problem Relation Age of Onset  . Stroke Other   . Cancer Other   . Hyperlipidemia Mother   . Hypertension Mother     Social History Social History   Tobacco Use  . Smoking status: Former Research scientist (life sciences)  . Smokeless tobacco: Never Used  . Tobacco comment: smoked on and off 16 years- quit 2016  Substance Use Topics  . Alcohol use: Yes    Comment: 5 bottle of wine daily, last consumption 1 hr PTA  . Drug use: No    Frequency: 1.0 times per week    Types: "Crack" cocaine, Cocaine    Comment: clean 3 weeks     Allergies   Codeine;  Penicillins; Morphine; Coconut flavor; Coconut oil; Grapefruit concentrate; Morphine and related; Oxycodone; and Norco [hydrocodone-acetaminophen]   Review of Systems Review of Systems  All other systems reviewed and are negative.    Physical Exam Updated Vital Signs BP (!) 135/99 (BP Location: Right Arm)   Pulse (!) 115   Temp 97.6 F (36.4 C) (Oral)   Resp 20   Ht 5\' 6"  (1.676 m)   Wt 68.9 kg   SpO2 100%   BMI 24.53 kg/m   Physical Exam  Constitutional: He is oriented to person, place, and time. He appears well-developed and well-nourished.  HENT:  Head: Normocephalic and atraumatic.  Eyes: EOM are normal.  Neck: Normal range of motion.  Cardiovascular: Regular rhythm, normal heart sounds and intact distal pulses.  Tachycardia  Pulmonary/Chest: Effort normal and breath sounds normal. No respiratory distress.  Abdominal: Soft. He exhibits no distension. There is no tenderness.  Musculoskeletal: Normal range of motion.  Neurological: He is alert and oriented to person, place, and time.  Skin: Skin is warm and dry.  Psychiatric: He has a normal mood and affect. Judgment normal.  Nursing note and vitals reviewed.    ED Treatments / Results  Labs (all labs ordered are listed, but only abnormal results are displayed) Labs Reviewed  CBC - Abnormal; Notable for the following components:      Result Value   Platelets 134 (*)    All other components within normal limits  URINALYSIS, ROUTINE W REFLEX MICROSCOPIC - Abnormal; Notable for the following components:   pH 9.0 (*)    Ketones, ur 5 (*)    Protein, ur 30 (*)    All other components within normal limits  LIPASE, BLOOD  COMPREHENSIVE METABOLIC PANEL  ETHANOL  RAPID URINE DRUG SCREEN, HOSP PERFORMED    EKG None  Radiology No results found.  Procedures Procedures (including critical care time)  Medications Ordered in ED Medications  sodium chloride 0.9 % bolus 1,000 mL (has no administration in time  range)  metoCLOPramide (REGLAN) injection 10 mg (has no administration in time range)  LORazepam (ATIVAN) injection 1 mg (has no administration in time range)  ondansetron (ZOFRAN-ODT) disintegrating tablet 4 mg (4 mg Oral Given 06/06/18 1306)     Initial Impression / Assessment and Plan / ED Course  I have reviewed the triage vital signs and the nursing notes.  Pertinent labs & imaging results that were available during my care of the patient were reviewed by me and considered in my medical decision making (see chart for details).    Feels better.  Labs without significant derangement.  Home with Librium.  Overall well-appearing.  No seizure activity here in the ER.  Ongoing oral hydration recommended at home.  Patient understands return to the ER for new or worsening symptoms   Final Clinical Impressions(s) / ED Diagnoses   Final diagnoses:  Alcohol abuse    ED Discharge Orders    None       Jola Schmidt, MD 06/06/18 1625

## 2018-06-06 NOTE — ED Triage Notes (Addendum)
Per EMS- Patient is requesting detox from alcohol. Patient states his last drink ws today.  Patient reports that he had a possible seizure today because he found himself lying on the floor and does this when he tries to detox from alcohol. Patient also c/o abdominal pain, N/V, and muscle cramping.  Upon arrival to triage the patient, he was caught drinking water from the sink.

## 2018-07-29 ENCOUNTER — Emergency Department (HOSPITAL_COMMUNITY)
Admission: EM | Admit: 2018-07-29 | Discharge: 2018-07-29 | Disposition: A | Discharge status: Other Acute Inpatient Hospital | Payer: Self-pay | Attending: Emergency Medicine | Admitting: Emergency Medicine

## 2018-07-29 ENCOUNTER — Inpatient Hospital Stay (HOSPITAL_COMMUNITY)
Admission: AD | Admit: 2018-07-29 | Discharge: 2018-08-02 | DRG: 885 | Disposition: A | Discharge status: Home / Self Care | Payer: Federal, State, Local not specified - Other | Source: Intra-hospital | Attending: Psychiatry | Admitting: Psychiatry

## 2018-07-29 ENCOUNTER — Other Ambulatory Visit: Payer: Self-pay

## 2018-07-29 DIAGNOSIS — Z23 Encounter for immunization: Secondary | ICD-10-CM | POA: Diagnosis not present

## 2018-07-29 DIAGNOSIS — F32A Depression, unspecified: Secondary | ICD-10-CM

## 2018-07-29 DIAGNOSIS — R112 Nausea with vomiting, unspecified: Secondary | ICD-10-CM | POA: Insufficient documentation

## 2018-07-29 DIAGNOSIS — F603 Borderline personality disorder: Secondary | ICD-10-CM | POA: Diagnosis present

## 2018-07-29 DIAGNOSIS — Z87891 Personal history of nicotine dependence: Secondary | ICD-10-CM | POA: Insufficient documentation

## 2018-07-29 DIAGNOSIS — R42 Dizziness and giddiness: Secondary | ICD-10-CM | POA: Insufficient documentation

## 2018-07-29 DIAGNOSIS — Z905 Acquired absence of kidney: Secondary | ICD-10-CM

## 2018-07-29 DIAGNOSIS — I10 Essential (primary) hypertension: Secondary | ICD-10-CM | POA: Diagnosis present

## 2018-07-29 DIAGNOSIS — K529 Noninfective gastroenteritis and colitis, unspecified: Secondary | ICD-10-CM | POA: Insufficient documentation

## 2018-07-29 DIAGNOSIS — F209 Schizophrenia, unspecified: Secondary | ICD-10-CM | POA: Diagnosis present

## 2018-07-29 DIAGNOSIS — Z79899 Other long term (current) drug therapy: Secondary | ICD-10-CM | POA: Diagnosis not present

## 2018-07-29 DIAGNOSIS — Z8349 Family history of other endocrine, nutritional and metabolic diseases: Secondary | ICD-10-CM

## 2018-07-29 DIAGNOSIS — F332 Major depressive disorder, recurrent severe without psychotic features: Principal | ICD-10-CM | POA: Diagnosis present

## 2018-07-29 DIAGNOSIS — F3164 Bipolar disorder, current episode mixed, severe, with psychotic features: Secondary | ICD-10-CM | POA: Insufficient documentation

## 2018-07-29 DIAGNOSIS — Z853 Personal history of malignant neoplasm of breast: Secondary | ICD-10-CM | POA: Insufficient documentation

## 2018-07-29 DIAGNOSIS — R45851 Suicidal ideations: Secondary | ICD-10-CM | POA: Diagnosis present

## 2018-07-29 DIAGNOSIS — Z8249 Family history of ischemic heart disease and other diseases of the circulatory system: Secondary | ICD-10-CM

## 2018-07-29 DIAGNOSIS — Z9013 Acquired absence of bilateral breasts and nipples: Secondary | ICD-10-CM | POA: Diagnosis not present

## 2018-07-29 DIAGNOSIS — I2510 Atherosclerotic heart disease of native coronary artery without angina pectoris: Secondary | ICD-10-CM | POA: Diagnosis present

## 2018-07-29 DIAGNOSIS — Z590 Homelessness: Secondary | ICD-10-CM

## 2018-07-29 DIAGNOSIS — I739 Peripheral vascular disease, unspecified: Secondary | ICD-10-CM | POA: Diagnosis present

## 2018-07-29 DIAGNOSIS — E7800 Pure hypercholesterolemia, unspecified: Secondary | ICD-10-CM | POA: Diagnosis present

## 2018-07-29 DIAGNOSIS — Z915 Personal history of self-harm: Secondary | ICD-10-CM

## 2018-07-29 DIAGNOSIS — F410 Panic disorder [episodic paroxysmal anxiety] without agoraphobia: Secondary | ICD-10-CM | POA: Diagnosis present

## 2018-07-29 DIAGNOSIS — F319 Bipolar disorder, unspecified: Secondary | ICD-10-CM | POA: Diagnosis present

## 2018-07-29 DIAGNOSIS — Z823 Family history of stroke: Secondary | ICD-10-CM | POA: Diagnosis not present

## 2018-07-29 DIAGNOSIS — F10239 Alcohol dependence with withdrawal, unspecified: Secondary | ICD-10-CM | POA: Diagnosis present

## 2018-07-29 DIAGNOSIS — K7460 Unspecified cirrhosis of liver: Secondary | ICD-10-CM | POA: Diagnosis present

## 2018-07-29 DIAGNOSIS — Z85528 Personal history of other malignant neoplasm of kidney: Secondary | ICD-10-CM | POA: Diagnosis not present

## 2018-07-29 DIAGNOSIS — F1920 Other psychoactive substance dependence, uncomplicated: Secondary | ICD-10-CM

## 2018-07-29 DIAGNOSIS — F329 Major depressive disorder, single episode, unspecified: Secondary | ICD-10-CM | POA: Insufficient documentation

## 2018-07-29 DIAGNOSIS — F1024 Alcohol dependence with alcohol-induced mood disorder: Secondary | ICD-10-CM | POA: Diagnosis present

## 2018-07-29 DIAGNOSIS — R51 Headache: Secondary | ICD-10-CM | POA: Insufficient documentation

## 2018-07-29 DIAGNOSIS — F1120 Opioid dependence, uncomplicated: Secondary | ICD-10-CM

## 2018-07-29 LAB — CBC WITH DIFFERENTIAL/PLATELET
Abs Immature Granulocytes: 0.02 10*3/uL (ref 0.00–0.07)
BASOS PCT: 0 %
Basophils Absolute: 0 10*3/uL (ref 0.0–0.1)
EOS ABS: 0 10*3/uL (ref 0.0–0.5)
Eosinophils Relative: 1 %
HCT: 49.6 % (ref 39.0–52.0)
Hemoglobin: 16.2 g/dL (ref 13.0–17.0)
Immature Granulocytes: 0 %
Lymphocytes Relative: 12 %
Lymphs Abs: 0.6 10*3/uL — ABNORMAL LOW (ref 0.7–4.0)
MCH: 30.9 pg (ref 26.0–34.0)
MCHC: 32.7 g/dL (ref 30.0–36.0)
MCV: 94.5 fL (ref 80.0–100.0)
MONO ABS: 0.3 10*3/uL (ref 0.1–1.0)
MONOS PCT: 6 %
Neutro Abs: 4.2 10*3/uL (ref 1.7–7.7)
Neutrophils Relative %: 81 %
PLATELETS: 181 10*3/uL (ref 150–400)
RBC: 5.25 MIL/uL (ref 4.22–5.81)
RDW: 15.1 % (ref 11.5–15.5)
WBC: 5.1 10*3/uL (ref 4.0–10.5)
nRBC: 0 % (ref 0.0–0.2)

## 2018-07-29 LAB — COMPREHENSIVE METABOLIC PANEL
ALT: 133 U/L — ABNORMAL HIGH (ref 0–44)
AST: 356 U/L — ABNORMAL HIGH (ref 15–41)
Albumin: 4.4 g/dL (ref 3.5–5.0)
Alkaline Phosphatase: 41 U/L (ref 38–126)
Anion gap: 18 — ABNORMAL HIGH (ref 5–15)
BUN: 11 mg/dL (ref 6–20)
CALCIUM: 9.5 mg/dL (ref 8.9–10.3)
CO2: 22 mmol/L (ref 22–32)
CREATININE: 1.03 mg/dL (ref 0.61–1.24)
Chloride: 101 mmol/L (ref 98–111)
GFR calc Af Amer: >60 mL/min (ref >60)
GLUCOSE: 88 mg/dL (ref 70–99)
Potassium: 4.3 mmol/L (ref 3.5–5.1)
SODIUM: 141 mmol/L (ref 135–145)
TOTAL PROTEIN: 7.6 g/dL (ref 6.5–8.1)
Total Bilirubin: 1.3 mg/dL — ABNORMAL HIGH (ref 0.3–1.2)

## 2018-07-29 LAB — LIPASE, BLOOD: Lipase: 41 U/L (ref 11–51)

## 2018-07-29 LAB — URINALYSIS, ROUTINE W REFLEX MICROSCOPIC
BILIRUBIN URINE: NEGATIVE
Bacteria, UA: NONE SEEN
Glucose, UA: NEGATIVE mg/dL
Hgb urine dipstick: NEGATIVE
Ketones, ur: 80 mg/dL — AB
Leukocytes, UA: NEGATIVE
Nitrite: NEGATIVE
Protein, ur: 100 mg/dL — AB
SPECIFIC GRAVITY, URINE: 1.025 (ref 1.005–1.030)
pH: 7 (ref 5.0–8.0)

## 2018-07-29 MED ORDER — LORAZEPAM 1 MG PO TABS
1.0000 mg | ORAL_TABLET | Freq: Every day | ORAL | Status: AC
  Administered 2018-08-02: 1 mg via ORAL
  Filled 2018-07-29: qty 1

## 2018-07-29 MED ORDER — MAGNESIUM HYDROXIDE 400 MG/5ML PO SUSP: 30.0000 mL | Freq: Every day | ORAL | Status: DC | PRN

## 2018-07-29 MED ORDER — ONDANSETRON 4 MG PO TBDP: 4.0000 mg | ORAL_TABLET | ORAL | Status: DC | PRN

## 2018-07-29 MED ORDER — ALUM & MAG HYDROXIDE-SIMETH 200-200-20 MG/5ML PO SUSP: 30.0000 mL | ORAL | Status: DC | PRN

## 2018-07-29 MED ORDER — LORAZEPAM 0.5 MG PO TABS
0.5000 mg | ORAL_TABLET | Freq: Three times a day (TID) | ORAL | Status: AC
  Administered 2018-07-31 (×3): 0.5 mg via ORAL
  Filled 2018-07-29 (×4): qty 1

## 2018-07-29 MED ORDER — ADULT MULTIVITAMIN W/MINERALS CH
1.0000 | ORAL_TABLET | Freq: Every day | ORAL | Status: DC
  Administered 2018-07-30 – 2018-08-02 (×4): 1 via ORAL
  Filled 2018-07-29 (×7): qty 1

## 2018-07-29 MED ORDER — VITAMIN B-1 100 MG PO TABS
100.0000 mg | ORAL_TABLET | Freq: Every day | ORAL | Status: DC
  Administered 2018-07-30 – 2018-08-02 (×4): 100 mg via ORAL
  Filled 2018-07-29 (×7): qty 1

## 2018-07-29 MED ORDER — HYDROXYZINE HCL 25 MG PO TABS
25.0000 mg | ORAL_TABLET | Freq: Three times a day (TID) | ORAL | Status: DC | PRN
  Administered 2018-07-30 (×2): 25 mg via ORAL
  Filled 2018-07-29 (×2): qty 1

## 2018-07-29 MED ORDER — ONDANSETRON HCL 4 MG PO TABS: 4.0000 mg | ORAL_TABLET | Freq: Four times a day (QID) | ORAL | 0 refills | Status: DC

## 2018-07-29 MED ORDER — LORAZEPAM 1 MG PO TABS: 1.0000 mg | ORAL_TABLET | Freq: Every day | ORAL | Status: DC

## 2018-07-29 MED ORDER — FENTANYL CITRATE (PF) 100 MCG/2ML IJ SOLN
50.0000 ug | Freq: Once | INTRAMUSCULAR | Status: AC
  Administered 2018-07-29: 50 ug via INTRAVENOUS
  Filled 2018-07-29: qty 2

## 2018-07-29 MED ORDER — SODIUM CHLORIDE 0.9 % IV BOLUS
1000.0000 mL | Freq: Once | INTRAVENOUS | Status: AC
  Administered 2018-07-29: 1000 mL via INTRAVENOUS

## 2018-07-29 MED ORDER — LORAZEPAM 1 MG PO TABS
1.0000 mg | ORAL_TABLET | Freq: Four times a day (QID) | ORAL | Status: AC | PRN
  Administered 2018-07-30: 1 mg via ORAL
  Filled 2018-07-29: qty 1

## 2018-07-29 MED ORDER — ACETAMINOPHEN 325 MG PO TABS
650.0000 mg | ORAL_TABLET | Freq: Once | ORAL | Status: AC
  Administered 2018-07-29: 650 mg via ORAL
  Filled 2018-07-29: qty 2

## 2018-07-29 MED ORDER — LORAZEPAM 0.5 MG PO TABS: 0.5000 mg | ORAL_TABLET | Freq: Three times a day (TID) | ORAL | Status: DC

## 2018-07-29 MED ORDER — LORAZEPAM 1 MG PO TABS: 1.0000 mg | ORAL_TABLET | Freq: Three times a day (TID) | ORAL | Status: DC

## 2018-07-29 MED ORDER — LORAZEPAM 0.5 MG PO TABS: 0.5000 mg | ORAL_TABLET | Freq: Four times a day (QID) | ORAL | Status: DC

## 2018-07-29 MED ORDER — LOPERAMIDE HCL 2 MG PO CAPS: 2.0000 mg | ORAL_CAPSULE | ORAL | Status: AC | PRN

## 2018-07-29 MED ORDER — ONDANSETRON HCL 4 MG/2ML IJ SOLN
4.0000 mg | Freq: Once | INTRAMUSCULAR | Status: AC
  Administered 2018-07-29: 4 mg via INTRAVENOUS
  Filled 2018-07-29: qty 2

## 2018-07-29 MED ORDER — LORAZEPAM 0.5 MG PO TABS
0.5000 mg | ORAL_TABLET | Freq: Two times a day (BID) | ORAL | Status: AC
  Administered 2018-08-01 (×2): 0.5 mg via ORAL
  Filled 2018-07-29 (×2): qty 1

## 2018-07-29 MED ORDER — LORAZEPAM 1 MG PO TABS: 1.0000 mg | ORAL_TABLET | Freq: Two times a day (BID) | ORAL | Status: DC

## 2018-07-29 MED ORDER — MECLIZINE HCL 25 MG PO TABS: 25.0000 mg | ORAL_TABLET | Freq: Two times a day (BID) | ORAL | 0 refills | Status: DC | PRN

## 2018-07-29 MED ORDER — LORAZEPAM 0.5 MG PO TABS
0.5000 mg | ORAL_TABLET | Freq: Four times a day (QID) | ORAL | Status: AC
  Administered 2018-07-30 (×4): 0.5 mg via ORAL
  Filled 2018-07-29 (×3): qty 1

## 2018-07-29 MED ORDER — LORAZEPAM 1 MG PO TABS: 1.0000 mg | ORAL_TABLET | Freq: Four times a day (QID) | ORAL | Status: DC

## 2018-07-29 NOTE — ED Notes (Signed)
[facility] to transport pt to [facility] about 30 min out

## 2018-07-29 NOTE — Discharge Instructions (Addendum)
Medication for nausea.  Transfer to behavioral health.

## 2018-07-29 NOTE — BH Assessment (Addendum)
Tele Assessment Note   Patient Name: [name] MRN: [id number] Referring Physician: Lacinda Axon Location of Patient: [facility] ED Location of Provider: [place] is an 38 y.o. male presenting voluntarily to [facility] ED complaining of numerous physical complaints including severe headache, abdominal pain, and dizziness. Patient also reports worsening depression over the past 3 days and suicidal thoughts with a plan to overdose. He is unable to contract for safety at this time. Patient reports he was hospitalized at [facility] in May 2019 after an intentional overdose. He states he quit drinking alcohol from June 2019 until November 2019 when he relapsed. He reports daily alcohol and cocaine use and reports he last used on 07/28/18. UDS pending at time of assessment. Patient currently endorses withdrawal symptoms. Patient reports he was laid off from his job 3 weeks ago and has not had money to refill his prescriptions. Patient is seen at [facility] for therapy and medication management. He denies HI/AVH. Patient denies any current criminal charges or history of abuse.   Diagnosis: F33.2 MDD, recurrent, severe   F10.20 Alcohol use disorder, severe   F14.20 Cocaine use disorder, severe    Past Medical History:  Past Medical History:  Diagnosis Date  . Angina   . Anxiety    panic attack  . Bipolar 1 disorder ([place])   . Breast CA ([place]) dx'd 2009   bil w/ bil masectomy and oral meds  . Cancer [facility])    kidney cancer  . Coronary artery disease   . Depression   . H/O suicide attempt 2015   overdose  . Headache(784.0)   . Hypercholesteremia   . Hypertension   . Liver cirrhosis ([place])   . Pancreatitis   . Peripheral vascular disease [facility]) April 2011   Left Pop  . Schizophrenia ([place])   . Seizures ([place])    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  . Shortness of breath     Past Surgical History:  Procedure Laterality Date  . BREAST SURGERY    . BREAST SURGERY     bilateral breast  silocone  removal  . CHEST SURGERY    . left kidney removal    . left leg surgery     "popiteal artery clogged"  . MASTECTOMY Bilateral   . NEPHRECTOMY Left   . ORIF CLAVICULAR FRACTURE Left 08/10/2017   Procedure: OPEN REDUCTION INTERNAL FIXATION (ORIF) LEFT CLAVICLE FRACTURE WITH RECONSTRUCTION OF CORACOCLAVICULAR LIGAMENT;  Surgeon: Leandrew Koyanagi, MD;  Location: [place];  Service: Orthopedics;  Laterality: Left;  . RECONSTRUCTION OF CORACOCLAVICULAR LIGAMENT Left 08/10/2017   Procedure: RECONSTRUCTION OF CORACOCLAVICULAR LIGAMENT;  Surgeon: Leandrew Koyanagi, MD;  Location: [place];  Service: Orthopedics;  Laterality: Left;    Family History:  Family History  Problem Relation Age of Onset  . Stroke Other   . Cancer Other   . Hyperlipidemia Mother   . Hypertension Mother     Social History:  reports that he has quit smoking. He has never used smokeless tobacco. He reports current alcohol use. He reports that he does not use drugs.  Additional Social History:  Alcohol / Drug Use Pain Medications: see MAR Prescriptions: see MAR Over the Counter: see MAR History of alcohol / drug use?: Yes Longest period of sobriety (when/how long): 6 months Substance #1 Name of Substance 1: Alcohol 1 - Age of First Use: UTA 1 - Amount (size/oz): 2 beers- "a whole lot" 1 - Frequency: daily 1 - Duration: 1 month  1 - Last Use / Amount: 07/28/18 Substance #2 Name of Substance 2: cocaine 2 - Age of First Use: UTA 2 - Amount (size/oz): varies 2 - Frequency: varies 2 - Duration: varies 2 - Last Use / Amount: 07/28/18  CIWA: CIWA-Ar BP: (!) 138/103 Pulse Rate: 96 Nausea and Vomiting: 5 Tactile Disturbances: very mild itching, pins and needles, burning or numbness Tremor: moderate, with patient's arms extended Auditory Disturbances: not present Paroxysmal Sweats: barely perceptible sweating, palms moist Visual Disturbances: not present Anxiety: mildly anxious Headache, Fullness in Head: extremely  severe Agitation: normal activity Orientation and Clouding of Sensorium: oriented and can do serial additions CIWA-Ar Total: 18 COWS:    Allergies:  Allergies  Allergen Reactions  . Codeine Hives, Itching, Swelling and Other (See Comments)    Does not impair breathing, however  . Penicillins Swelling and Other (See Comments)    Has patient had a PCN reaction causing immediate rash, facial/tongue/throat swelling, SOB or lightheadedness with hypotension: Yes Has patient had a PCN reaction causing severe rash involving mucus membranes or skin necrosis: Yes Has patient had a PCN reaction that required hospitalization Yes-ed visit Has patient had a PCN reaction occurring within the last 10 years: Yes If all of the above answers are "NO", then may proceed with Cephalosporin use.   [place] Kitchen Morphine Itching  . Coconut Flavor Itching and Swelling  . Coconut Oil Itching, Swelling and Other (See Comments)    Cannot take with some of his meds (also)  . Grapefruit Concentrate Other (See Comments)    Cannot take with some of his meds  . Morphine And Related Itching and Swelling  . Oxycodone Itching and Swelling  . Norco [Hydrocodone-Acetaminophen] Itching and Rash    Home Medications: (Not in a hospital admission)   OB/GYN Status:  No LMP for male patient.  General Assessment Data Assessment unable to be completed: Yes Reason for not completing assessment: (telepsych machiene already in use when order came in) Location of Assessment: [facility] ED TTS Assessment: In system Is this a Tele or Face-to-Face Assessment?: Tele Assessment Is this an Initial Assessment or a Re-assessment for this encounter?: Initial Assessment Patient Accompanied by:: N/A Language Other than English: No Living Arrangements: Other (Comment)(apartment) What gender do you identify as?: Male Marital status: Single Maiden name: Balbuena Pregnancy Status: No Living Arrangements: Non-relatives/Friends Can pt return to current  living arrangement?: Yes Admission Status: Voluntary Is patient capable of signing voluntary admission?: Yes Referral Source: Self/Family/Friend Insurance type: None     Crisis Care Plan Living Arrangements: Non-relatives/Friends     Risk to self with the past 6 months Suicidal Ideation: Yes-Currently Present Has patient been a risk to self within the past 6 months prior to admission? : No Suicidal Intent: Yes-Currently Present Has patient had any suicidal intent within the past 6 months prior to admission? : No Is patient at risk for suicide?: Yes Suicidal Plan?: Yes-Currently Present Has patient had any suicidal plan within the past 6 months prior to admission? : No Specify Current Suicidal Plan: overdose Access to Means: Yes Specify Access to Suicidal Means: prescriptions What has been your use of drugs/alcohol within the last 12 months?: alcohol and cocaine Previous Attempts/Gestures: Yes How many times?: 15 Other Self Harm Risks: none reported Triggers for Past Attempts: Other (Comment)(mental illness) Intentional Self Injurious Behavior: None Family Suicide History: No Recent stressful life event(s): Job Loss, Other (Comment)(relapse) Persecutory voices/beliefs?: No Depression: Yes Depression Symptoms: Despondent, Insomnia, Isolating, Guilt, Loss of interest in usual  pleasures, Feeling worthless/self pity, Feeling angry/irritable Substance abuse history and/or treatment for substance abuse?: Yes Suicide prevention information given to non-admitted patients: Not applicable  Risk to Others within the past 6 months Homicidal Ideation: No Does patient have any lifetime risk of violence toward others beyond the six months prior to admission? : No Thoughts of Harm to Others: No Current Homicidal Intent: No Current Homicidal Plan: No Access to Homicidal Means: No Identified Victim: none History of harm to others?: No Assessment of Violence: None Noted Violent Behavior  Description: none reported Does patient have access to weapons?: No Criminal Charges Pending?: No Does patient have a court date: No Is patient on probation?: No  Psychosis Hallucinations: None noted Delusions: None noted  Mental Status Report Appearance/Hygiene: In scrubs Eye Contact: Good Motor Activity: Freedom of movement Speech: Logical/coherent, Rapid Level of Consciousness: Alert Mood: Depressed, Anxious Affect: Anxious Anxiety Level: Moderate Thought Processes: Coherent, Relevant Judgement: Impaired Orientation: Person, Place, Time, Situation Obsessive Compulsive Thoughts/Behaviors: None  Cognitive Functioning Concentration: Normal Memory: Recent Intact, Remote Intact Is patient IDD: No Insight: Fair Impulse Control: Poor Appetite: Fair Have you had any weight changes? : No Change Sleep: No Change Total Hours of Sleep: 6 Vegetative Symptoms: Staying in bed  ADLScreening [facility] Assessment Services) [name] cognitive ability adequate to safely complete daily activities?: Yes Patient able to express need for assistance with ADLs?: No Independently performs ADLs?: Yes (appropriate for developmental age)  Prior Inpatient Therapy Prior Inpatient Therapy: Yes Prior Therapy Dates: 2019, 2017, 2014 Prior Therapy Facilty/Provider(s): [facility], [facility] Reason for Treatment: suicide attempt. depression, substance use  Prior Outpatient Therapy Prior Outpatient Therapy: Yes Prior Therapy Dates: ongoing Prior Therapy Facilty/Provider(s): [facility] Reason for Treatment: therapy and med management Does patient have an ACCT team?: No Does patient have Intensive In-House Services?  : No Does patient have [facility] services? : No Does patient have P4CC services?: No  ADL Screening (condition at time of admission) [name] cognitive ability adequate to safely complete daily activities?: Yes Is the patient deaf or have difficulty hearing?: No Does the patient have difficulty  seeing, even when wearing glasses/contacts?: No Does the patient have difficulty concentrating, remembering, or making decisions?: Yes Patient able to express need for assistance with ADLs?: No Does the patient have difficulty dressing or bathing?: No Independently performs ADLs?: Yes (appropriate for developmental age) Does the patient have difficulty walking or climbing stairs?: No Weakness of Legs: None Weakness of Arms/Hands: None  Home Assistive Devices/Equipment Home Assistive Devices/Equipment: None  Therapy Consults (therapy consults require a physician order) PT Evaluation Needed: No OT Evalulation Needed: No SLP Evaluation Needed: No Abuse/Neglect Assessment (Assessment to be complete while patient is alone) Abuse/Neglect Assessment Can Be Completed: Yes Physical Abuse: Denies Verbal Abuse: Denies Sexual Abuse: Denies Exploitation of patient/patient's resources: Denies Self-Neglect: Denies Values / Beliefs Cultural Requests During Hospitalization: None Spiritual Requests During Hospitalization: None Consults Spiritual Care Consult Needed: No Social Work Consult Needed: No Regulatory affairs officer (For Healthcare) Does Patient Have a Medical Advance Directive?: No Would patient like information on creating a medical advance directive?: No - Patient declined          Disposition: Per Lindon Romp, NP patient meets in patient criteria pending medical clearance. Patient has been accepted to [facility]. Judson Roch, RN informed of dispostion Disposition Initial Assessment Completed for this Encounter: Yes  This service was provided via telemedicine using a 2-way, interactive audio and video technology.  Names of all persons participating in this telemedicine service and their  role in this encounter. Name: Orvis Brill, [place] Role: TTS  Name: [name] Role: patient  Name:  Role:   Name:  Role:     Orvis Brill 07/29/2018 9:42 PM

## 2018-07-29 NOTE — ED Provider Notes (Signed)
[facility] EMERGENCY DEPARTMENT Provider Note   CSN: [id number] Arrival date & time: 07/29/18  1759     History   Chief Complaint Chief Complaint  Patient presents with  . Headache  . Suicidal    HPI [name] is a 38 y.o. male.  Nausea, vomiting, diarrhea, dizziness, headache for the last 24 hours.  Additionally he is depressed and has suicidal ideation.  He has been drinking alcohol and smoking crack.  Past medical history includes bipolar disorder, schizophrenia, breast cancer, kidney cancer, anxiety, hypertension, pancreatitis, several others.     Past Medical History:  Diagnosis Date  . Angina   . Anxiety    panic attack  . Bipolar 1 disorder ([place])   . Breast CA ([place]) dx'd 2009   bil w/ bil masectomy and oral meds  . Cancer [facility])    kidney cancer  . Coronary artery disease   . Depression   . H/O suicide attempt 2015   overdose  . Headache(784.0)   . Hypercholesteremia   . Hypertension   . Liver cirrhosis ([place])   . Pancreatitis   . Peripheral vascular disease [facility]) April 2011   Left Pop  . Schizophrenia ([place])   . Seizures ([place])    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  . Shortness of breath     Patient Active Problem List   Diagnosis Date Noted  . Chronic left shoulder pain 01/11/2018  . Pain of left clavicle 01/11/2018  . Alcohol withdrawal seizure ([place]) 12/31/2017  . Abdominal pain 12/31/2017  . Alcohol withdrawal seizure without complication ([place])   . Melena 12/12/2017  . Emesis 10/20/2017  . Displaced fracture of lateral end of left clavicle, initial encounter for closed fracture 08/05/2017  . Coracoclavicular (ligament) sprain and strain, left, initial encounter 08/05/2017  . Closed dislocation of acromioclavicular joint, initial encounter 08/05/2017  . Insomnia   . Hypertension   . Severe episode of recurrent major depressive disorder, without psychotic features ([place])   . Depression, major, severe recurrence ([place]) 12/30/2015   . Substance induced mood disorder ([place]) 12/02/2015  . Mood disorder in conditions classified elsewhere   . Malnutrition of moderate degree 09/24/2015  . Alcohol withdrawal ([place]) 09/23/2015  . Tobacco use disorder 07/16/2015  . Elevated transaminase level   . Drug overdose, intentional ([place]) 07/12/2015  . Alcohol use disorder, severe, dependence ([place]) 04/15/2015  . Cocaine abuse with cocaine-induced mood disorder ([place]) 04/11/2015  . Overdose 04/10/2015  . Severe recurrent major depressive disorder with psychotic features ([place])   . Severe major depression with psychotic features ([place]) 09/11/2014  . Alcohol-induced mood disorder ([place]) 09/10/2014  . Suicidal ideation   . Tylenol overdose   . Polysubstance abuse ([place])   . Overdose of acetaminophen 08/03/2014  . Overdose by acetaminophen 08/03/2014  . Cocaine abuse ([place]) 04/16/2014  . Thrombocytopenia ([place]) 04/15/2014  . Urinary tract infection, site not specified 04/15/2014  . Chest pain, unspecified 04/15/2014  . D-dimer, elevated 04/15/2014  . Transaminitis 09/24/2013  . Cocaine dependence ([place]) 09/20/2013  . S/p nephrectomy 04/28/2013  . Seizure ([place]) 03/15/2013  . Syncope 01/02/2013  . Leukocytopenia, unspecified 01/02/2013  . Left kidney mass 12/24/2012  . PTSD (post-traumatic stress disorder) 07/06/2012  . Episode of recurrent major depressive disorder ([place]) 06/29/2012    Class: Acute  . Peripheral vascular disease ([place]) 01/14/2012  . Alcohol abuse 10/13/2011  . SEIZURE DISORDER 10/03/2008  . Elevated LFTs 12/29/2007  . HYPERCHOLESTEROLEMIA 03/21/2007  . Essential hypertension 03/21/2007  Past Surgical History:  Procedure Laterality Date  . BREAST SURGERY    . BREAST SURGERY     bilateral breast silocone  removal  . CHEST SURGERY    . left kidney removal    . left leg surgery     "popiteal artery clogged"  . MASTECTOMY Bilateral   . NEPHRECTOMY Left   . ORIF CLAVICULAR FRACTURE Left 08/10/2017   Procedure:  OPEN REDUCTION INTERNAL FIXATION (ORIF) LEFT CLAVICLE FRACTURE WITH RECONSTRUCTION OF CORACOCLAVICULAR LIGAMENT;  Surgeon: Leandrew Koyanagi, MD;  Location: [facility];  Service: Orthopedics;  Laterality: Left;  . RECONSTRUCTION OF CORACOCLAVICULAR LIGAMENT Left 08/10/2017   Procedure: RECONSTRUCTION OF CORACOCLAVICULAR LIGAMENT;  Surgeon: Leandrew Koyanagi, MD;  Location: [facility];  Service: Orthopedics;  Laterality: Left;        Home Medications    Prior to Admission medications   Medication Sig Start Date End Date Taking? Authorizing Provider  amLODipine (NORVASC) 5 MG tablet Take 1 tablet (5 mg total) by mouth daily. 01/03/18  Yes Eugenie Filler, MD  busPIRone (BUSPAR) 10 MG tablet Take 1 tablet (10 mg total) by mouth 3 (three) times daily. 01/03/18  Yes Eugenie Filler, MD  chlordiazePOXIDE (LIBRIUM) 25 MG capsule 50mg  PO TID x 1D, then 25-50mg  PO BID X 1D, then 25-50mg  PO QD X 1D Patient taking differently: Take 25-50 mg by mouth See admin instructions. Take 50 mg by mouth three times daily for 1 day, then 25-50 mg two times daily for 1 day, then 25-50 mg once daily for 1 day as directed 06/06/18  Yes Jola Schmidt, MD  hydrOXYzine (ATARAX/VISTARIL) 25 MG tablet Take 25 mg by mouth 2 (two) times daily as needed for anxiety.   Yes [provider]  Ibuprofen 200 MG CAPS Take 400 mg by mouth every 8 (eight) hours as needed (for pain).    Yes [provider]  ondansetron (ZOFRAN ODT) 8 MG disintegrating tablet Take 1 tablet (8 mg total) by mouth every 8 (eight) hours as needed for nausea or vomiting. 06/06/18  Yes Jola Schmidt, MD  traZODone (DESYREL) 100 MG tablet Take 250 mg by mouth at bedtime as needed for sleep.   Yes [provider]  ziprasidone (GEODON) 20 MG capsule Take 20 mg by mouth 2 (two) times daily with a meal.    Yes [provider]  meclizine (ANTIVERT) 25 MG tablet Take 1 tablet (25 mg total) by mouth 2 (two) times daily as needed for dizziness.  07/29/18   Nat Christen, MD  ondansetron (ZOFRAN) 4 MG tablet Take 1 tablet (4 mg total) by mouth every 6 (six) hours. 07/29/18   Nat Christen, MD    Family History Family History  Problem Relation Age of Onset  . Stroke Other   . Cancer Other   . Hyperlipidemia Mother   . Hypertension Mother     Social History Social History   Tobacco Use  . Smoking status: Former Research scientist (life sciences)  . Smokeless tobacco: Never Used  . Tobacco comment: smoked on and off 16 years- quit 2016  Substance Use Topics  . Alcohol use: Yes    Comment: 5 bottle of wine daily, last consumption 1 hr PTA  . Drug use: No    Frequency: 1.0 times per week    Types: "Crack" cocaine, Cocaine    Comment: clean 3 weeks     Allergies   Codeine; Penicillins; Morphine; Coconut flavor; Coconut oil; Grapefruit concentrate; Morphine and related; Oxycodone; and Norco [hydrocodone-acetaminophen]  Review of Systems Review of Systems  All other systems reviewed and are negative.    Physical Exam Updated Vital Signs BP (!) 151/106 (BP Location: Right Arm)   Pulse 95   Temp 98.5 F (36.9 C) (Oral)   Resp 14   Ht 5\' 6"  (1.676 m)   Wt 70.3 kg   SpO2 97%   BMI 25.02 kg/m   Physical Exam Vitals signs and nursing note reviewed.  Constitutional:      Comments: Pale, vomiting  HENT:     Head: Normocephalic and atraumatic.  Eyes:     Conjunctiva/sclera: Conjunctivae normal.  Neck:     Musculoskeletal: Neck supple.  Cardiovascular:     Rate and Rhythm: Normal rate and regular rhythm.  Pulmonary:     Effort: Pulmonary effort is normal.     Breath sounds: Normal breath sounds.  Abdominal:     General: Bowel sounds are normal.     Palpations: Abdomen is soft.  Musculoskeletal: Normal range of motion.  Skin:    General: Skin is warm and dry.  Neurological:     Mental Status: He is oriented to person, place, and time.  Psychiatric:        Behavior: Behavior normal.      ED Treatments / Results  Labs (all labs  ordered are listed, but only abnormal results are displayed) Labs Reviewed  CBC WITH DIFFERENTIAL/PLATELET - Abnormal; Notable for the following components:      Result Value   Lymphs Abs 0.6 (*)    All other components within normal limits  COMPREHENSIVE METABOLIC PANEL - Abnormal; Notable for the following components:   AST 356 (*)    ALT 133 (*)    Total Bilirubin 1.3 (*)    Anion gap 18 (*)    All other components within normal limits  URINALYSIS, ROUTINE W REFLEX MICROSCOPIC - Abnormal; Notable for the following components:   Ketones, ur 80 (*)    Protein, ur 100 (*)    All other components within normal limits  LIPASE, BLOOD    EKG None  Radiology No results found.  Procedures Procedures (including critical care time)  Medications Ordered in ED Medications  ondansetron (ZOFRAN) injection 4 mg (4 mg Intravenous Given 07/29/18 1911)  sodium chloride 0.9 % bolus 1,000 mL (0 mLs Intravenous Stopped 07/29/18 2059)  fentaNYL (SUBLIMAZE) injection 50 mcg (50 mcg Intravenous Given 07/29/18 1911)  sodium chloride 0.9 % bolus 1,000 mL (0 mLs Intravenous Stopped 07/29/18 2200)  acetaminophen (TYLENOL) tablet 650 mg (650 mg Oral Given 07/29/18 2247)     Initial Impression / Assessment and Plan / ED Course  I have reviewed the triage vital signs and the nursing notes.  Pertinent labs & imaging results that were available during my care of the patient were reviewed by me and considered in my medical decision making (see chart for details).     History and physical most consistent with gastroenteritis.  Patient responded well to IV fluids and IV Zofran.  Behavioral health consult obtained.  Patient will be transferred to behavioral health.  Discharge medications Antivert 25 mg and Zofran 4 mg.   CRITICAL CARE Performed by: Nat Christen Total critical care time: 30 minutes Critical care time was exclusive of separately billable procedures and treating other patients. Critical care  was necessary to treat or prevent imminent or life-threatening deterioration. Critical care was time spent personally by me on the following activities: development of treatment plan with patient and/or surrogate as  well as nursing, discussions with consultants, evaluation of patient's response to treatment, examination of patient, obtaining history from patient or surrogate, ordering and performing treatments and interventions, ordering and review of laboratory studies, ordering and review of radiographic studies, pulse oximetry and re-evaluation of patient's condition.  Final Clinical Impressions(s) / ED Diagnoses   Final diagnoses:  Suicidal ideation  Depression, unspecified depression type  Gastroenteritis    ED Discharge Orders         Ordered    ondansetron (ZOFRAN) 4 MG tablet  Every 6 hours     07/29/18 2229    meclizine (ANTIVERT) 25 MG tablet  2 times daily PRN     07/29/18 2241           Nat Christen, MD 07/29/18 2333

## 2018-07-29 NOTE — ED Triage Notes (Signed)
Pt presents to ED with headache, abdominal pain with N/V that began last night, and vertigo. Also endorses SI and depression x 2 months. Sts has been drinking every day x 1 month and smoking crack. Last drink last night, last drug use yesterday. Has had hallucinations and seizures when withdrawing in the past.

## 2018-07-29 NOTE — ED Notes (Signed)
Patient belongings removed from room and placed at nurses' station

## 2018-07-30 ENCOUNTER — Encounter (HOSPITAL_COMMUNITY): Payer: Self-pay

## 2018-07-30 DIAGNOSIS — F332 Major depressive disorder, recurrent severe without psychotic features: Principal | ICD-10-CM

## 2018-07-30 DIAGNOSIS — F1024 Alcohol dependence with alcohol-induced mood disorder: Secondary | ICD-10-CM

## 2018-07-30 LAB — HEPATIC FUNCTION PANEL
ALT: 93 U/L — ABNORMAL HIGH (ref 0–44)
AST: 180 U/L — ABNORMAL HIGH (ref 15–41)
Albumin: 3.9 g/dL (ref 3.5–5.0)
Alkaline Phosphatase: 38 U/L (ref 38–126)
Bilirubin, Direct: 0.2 mg/dL (ref 0.0–0.2)
Indirect Bilirubin: 1.3 mg/dL — ABNORMAL HIGH (ref 0.3–0.9)
Total Bilirubin: 1.5 mg/dL — ABNORMAL HIGH (ref 0.3–1.2)
Total Protein: 6.8 g/dL (ref 6.5–8.1)

## 2018-07-30 LAB — LIPID PANEL
Cholesterol: 371 mg/dL — ABNORMAL HIGH (ref 0–200)
HDL: 114 mg/dL (ref >40)
LDL Cholesterol: 243 mg/dL — ABNORMAL HIGH (ref 0–99)
Total CHOL/HDL Ratio: 3.3 RATIO
Triglycerides: 70 mg/dL (ref <150)
VLDL: 14 mg/dL (ref 0–40)

## 2018-07-30 LAB — RAPID URINE DRUG SCREEN, HOSP PERFORMED
Amphetamines: NOT DETECTED
Barbiturates: NOT DETECTED
Benzodiazepines: POSITIVE — AB
Cocaine: POSITIVE — AB
Opiates: NOT DETECTED
Tetrahydrocannabinol: NOT DETECTED

## 2018-07-30 LAB — HEMOGLOBIN A1C
Hgb A1c MFr Bld: 4.9 % (ref 4.8–5.6)
MEAN PLASMA GLUCOSE: 93.93 mg/dL

## 2018-07-30 LAB — URINALYSIS, ROUTINE W REFLEX MICROSCOPIC
Bilirubin Urine: NEGATIVE
Glucose, UA: NEGATIVE mg/dL
Hgb urine dipstick: NEGATIVE
Ketones, ur: 5 mg/dL — AB
Leukocytes, UA: NEGATIVE
Nitrite: NEGATIVE
PH: 7 (ref 5.0–8.0)
Protein, ur: NEGATIVE mg/dL
Specific Gravity, Urine: 1.023 (ref 1.005–1.030)

## 2018-07-30 LAB — TSH: TSH: 0.91 u[IU]/mL (ref 0.350–4.500)

## 2018-07-30 LAB — ACETAMINOPHEN LEVEL: Acetaminophen (Tylenol), Serum: <10 ug/mL — ABNORMAL LOW (ref 10–30)

## 2018-07-30 MED ORDER — AMLODIPINE BESYLATE 5 MG PO TABS
5.0000 mg | ORAL_TABLET | Freq: Every day | ORAL | Status: DC
  Administered 2018-07-30: 5 mg via ORAL
  Filled 2018-07-30 (×4): qty 1

## 2018-07-30 MED ORDER — INFLUENZA VAC SPLIT QUAD 0.5 ML IM SUSY
0.5000 mL | PREFILLED_SYRINGE | INTRAMUSCULAR | Status: AC
  Administered 2018-07-31: 0.5 mL via INTRAMUSCULAR
  Filled 2018-07-30: qty 0.5

## 2018-07-30 MED ORDER — TRAZODONE HCL 100 MG PO TABS
200.0000 mg | ORAL_TABLET | Freq: Every day | ORAL | Status: DC
  Filled 2018-07-30 (×2): qty 2

## 2018-07-30 MED ORDER — BENZOCAINE 10 % MT GEL
OROMUCOSAL | Status: AC
  Filled 2018-07-30: qty 9.4

## 2018-07-30 MED ORDER — BUSPIRONE HCL 10 MG PO TABS
10.0000 mg | ORAL_TABLET | Freq: Three times a day (TID) | ORAL | Status: DC
  Administered 2018-07-30 – 2018-08-02 (×10): 10 mg via ORAL
  Filled 2018-07-30 (×2): qty 21
  Filled 2018-07-30 (×2): qty 1
  Filled 2018-07-30: qty 2
  Filled 2018-07-30: qty 1
  Filled 2018-07-30: qty 21
  Filled 2018-07-30 (×9): qty 1

## 2018-07-30 MED ORDER — BENZOCAINE 10 % MT GEL
OROMUCOSAL | Status: DC | PRN
  Administered 2018-07-30: 09:00:00 via OROMUCOSAL

## 2018-07-30 MED ORDER — TRAZODONE HCL 50 MG PO TABS
50.0000 mg | ORAL_TABLET | Freq: Every evening | ORAL | Status: DC | PRN
  Administered 2018-07-30: 50 mg via ORAL
  Filled 2018-07-30: qty 1

## 2018-07-30 MED ORDER — LAMOTRIGINE 25 MG PO TABS
25.0000 mg | ORAL_TABLET | Freq: Two times a day (BID) | ORAL | Status: DC
  Administered 2018-07-30: 25 mg via ORAL
  Filled 2018-07-30 (×5): qty 1

## 2018-07-30 MED ORDER — ZIPRASIDONE HCL 20 MG PO CAPS
20.0000 mg | ORAL_CAPSULE | Freq: Two times a day (BID) | ORAL | Status: DC
  Filled 2018-07-30 (×4): qty 1

## 2018-07-30 NOTE — BHH Suicide Risk Assessment (Signed)
[facility] INPATIENT:  Family/Significant Other Suicide Prevention Education  Suicide Prevention Education:  Patient Refusal for Family/Significant Other Suicide Prevention Education: The patient [name] has refused to provide written consent for family/significant other to be provided Family/Significant Other Suicide Prevention Education during admission and/or prior to discharge.  Physician notified.  Suicide Prevention Education was reviewed thoroughly with patient, including risk factors, warning signs, and what to do.  Mobile Crisis services were described and that telephone number pointed out, with encouragement to patient to put this number in personal cell phone.  Brochure was provided to patient to share with natural supports.  Patient acknowledged the ways in which they are at risk, and how working through each of their issues can gradually start to reduce their risk factors.  Patient was encouraged to think of the information in the context of people in their own lives.  Patient denied having access to firearms  Patient verbalized understanding of information provided.        [name] 07/30/2018, 9:34 AM

## 2018-07-30 NOTE — BHH Suicide Risk Assessment (Addendum)
[facility] Admission Suicide Risk Assessment   Nursing information obtained from:  Patient Demographic factors:  Male, Living alone, Unemployed Current Mental Status:  Suicidal ideation indicated by patient, Suicide plan Loss Factors:  Financial problems / change in socioeconomic status Historical Factors:  Prior suicide attempts Risk Reduction Factors:  Positive social support  Total Time spent with patient: 45 minutes Principal Problem: Alcohol Use Disorder, Alcohol Induced Mood Disorder versus MDD Diagnosis:  Active Problems:   Severe recurrent major depression without psychotic features (HCC)  Subjective Data:   Continued Clinical Symptoms:  Alcohol Use Disorder Identification Test Final Score (AUDIT): 20 The "Alcohol Use Disorders Identification Test", Guidelines for Use in Primary Care, Second Edition.  World Pharmacologist [facility]). Score between 0-7:  no or low risk or alcohol related problems. Score between 8-15:  moderate risk of alcohol related problems. Score between 16-19:  high risk of alcohol related problems. Score 20 or above:  warrants further diagnostic evaluation for alcohol dependence and treatment.   CLINICAL FACTORS:  62, single, currently living alone, reports he recently lost his job and is currently financially dependent on his family. He presented to hospital voluntarily for worsening depression, with suicidal ideations. He also reported headache, dizziness, which he attributes to history of vertigo. States these symptoms are better today. He reports he had been sober x 7 months, relapsed about three weeks ago. ( Alcohol, Cocaine). States he initially started drinking 2 glasses of wine, but states it has progressed to heavy , daily drinking . He has been off his psychiatric medications x 2 weeks, due to difficulty affording refills.He had been prescribed Geodon, Buspar, Buspar, Norvasc for HTN, Antivert PRN for dizziness . States medications were well tolerated and  helpful.  Patient has history of prior psychiatric admissions, and has been diagnosed with  MDD in the past. History of suicide attempt by overdosing . Reports past history of hallucinations,related to episodes of alcohol use or withdrawal, but otherwise denies history of psychosis. He is known to [facility] from prior psychiatric admissions , last time January 2019, at which time was admitted for depression, suicidal ideations, relapse. At the time was discharged on Geodon, Trazodone, Depakoter Reports history of Breast Surgery and Nephrectomy for Malignancies. Currently in remission. History of seizures, which he states he has been told are alcohol withdrawal related, but states he had a seizure in the past about 2 months into abstinence. States he is prescribed Lamictal for seizures ( at 25 mgrs BID?) , which he states he took up to a week ago. 10/12 QTc 495  Dx- Alcohol Dependence, Cocaine Abuse ,Alcohol Induced Mood Disorder, depressed versus MDD.   Plan- inpatient admission. Has been started on Ativan detox protocol to address potential alcohol WDL symptoms Has been restarted on Buspar and on Geodon, which he states he was taking prior to admission with good response and no side effects. He is also restarted on Lamictal 25 mgrs BID for history of seizures ( which he states he was taking regularly prior to admission, without side effects, without any rash). Decrease Trazodone dose to minimize risk of sedation. He is continued on Norvasc for HTN Check EKG to monitor QTc  Patient requests HIV testing . States he was tested negative a little more than a year ago, but wants to be retested, denies any STD symptoms .  Musculoskeletal: Strength & Muscle Tone: within normal limits- mild distal tremors, no diaphoresis, no restlessness or psychomotor agitation Gait & Station: normal Patient leans: N/A  Psychiatric  Specialty Exam: Physical Exam  ROS  No current headache, reports feeling vaguely dizzy,  denies visual disturbances, denies chest pain or shortness of breath, no vomiting, no fever or chills   Blood pressure (!) 128/99, pulse 72, temperature 98.4 F (36.9 C), temperature source Oral, resp. rate 16, height 5\' 6"  (1.676 m), weight 69.4 kg.Body mass index is 24.69 kg/m.  General Appearance: Fairly Groomed  Eye Contact:  Fair  Speech:  Normal Rate  Volume:  Decreased  Mood:  reports depression, but feeling better since admission, describes as 5/10  Affect:  vaguely anxious and constricted, but smiles at times appropriately  Thought Process:  Linear and Descriptions of Associations: Intact  Orientation:  Other:  fully alert and attentive  Thought Content:  no hallucinations, no delusions  Suicidal Thoughts:  No denies current suicidal or self injurious ideations , denies homicidal or violent ideations, contracts for safety on unit  Homicidal Thoughts:  No  Memory:  recent and remote grossly intact  Judgement:  Fair  Insight:  Fair  Psychomotor Activity:  Normal- no restlessness or agitation, not in any acute distress, no diaphoresis,  mild distal tremors  Concentration:  Concentration: Good and Attention Span: Good  Recall:  Good  Fund of Knowledge:  Good  Language:  Good  Akathisia:  Negative  Handed:  Right  AIMS (if indicated):     Assets:  Communication Skills Desire for Improvement Resilience  ADL's:  Intact  Cognition:  WNL  Sleep:         COGNITIVE FEATURES THAT CONTRIBUTE TO RISK:  Closed-mindedness and Loss of executive function    SUICIDE RISK:   Moderate:  Frequent suicidal ideation with limited intensity, and duration, some specificity in terms of plans, no associated intent, good self-control, limited dysphoria/symptomatology, some risk factors present, and identifiable protective factors, including available and accessible social support.  PLAN OF CARE: Patient will be admitted to inpatient psychiatric unit for stabilization and safety. Will provide and  encourage milieu participation. Provide medication management and maked adjustments as needed. Will also provide medication management to address potential alcohol WDL.  Will follow daily.    I certify that inpatient services furnished can reasonably be expected to improve the patient's condition.   Jenne Campus, MD 07/30/2018, 1:31 PM   07/30/2018 , 3,50 PM. Addendum EKG- NSR, HR 68, QTc is prolonged at 491. Will therefore hold off on starting Geodon , will discuss other therapeutic alternatives with patient. Will also discontinue Zofran PRNs.   Gabriel Earing, MD

## 2018-07-30 NOTE — BHH Group Notes (Signed)
[facility] Group Notes: (Clinical Social Work)   07/30/2018      Type of Therapy:  Group Therapy   Participation Level:  Did Not Attend despite MHT prompting   Selmer Dominion, LCSW 07/30/2018, 12:36 PM
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
## 2018-07-30 NOTE — Progress Notes (Signed)
D. Pt reports improving withdrawal symptoms, continues to report tiredness due to lack of sleep. Pt remained in bed for much of the morning, but was observed in the milieu following lunch.  Pt currently denies SI/HI and AVH and agrees to contact staff before acting on any harmful thoughts.  A. Labs and vitals monitored. Pt compliant with medications. Pt supported emotionally and encouraged to express concerns and ask questions.   R. Pt remains safe with 15 minute checks. Will continue POC.

## 2018-07-30 NOTE — Tx Team (Addendum)
Initial Treatment Plan 07/30/2018 2:16 AM [name] RRN:[id number]    PATIENT STRESSORS: Financial difficulties Medication change or noncompliance Substance abuse   PATIENT STRENGTHS: Curator fund of knowledge Motivation for treatment/growth Supportive family/friends   PATIENT IDENTIFIED PROBLEMS: Risk for suicide  Job loss  [place]   "don't want to be alone right now, want to get back on medication"  " want to find a therapist, maybe at [facility]:  Adequate post-discharge living arrangements Improved stabilization in mood, thinking, and/or behavior Verbal commitment to aftercare and medication compliance  PRELIMINARY DISCHARGE PLAN: Attend aftercare/continuing care group Outpatient therapy  PATIENT/FAMILY INVOLVEMENT: This treatment plan has been presented to and reviewed with the patient, [name] .  The patient and family have been given the opportunity to ask questions and make suggestions.  [name], RN 07/30/2018, 2:16 AM

## 2018-07-30 NOTE — Progress Notes (Signed)
Admission Note:  D:37 yr male who presents [facility] in no acute distress for the treatment of SI and Depression. Pt appears flat and depressed. Pt was calm and cooperative with admission process. Pt presents with passive SI and contracts for safety upon admission.pt stated he lost his job and started having increased depression for 2 weeks while on drug/ ETOH binge , during this time pt was not taking medication due to running out and not having money to buy more . Pt stated he would like to "find a therapist, maybe at [facility]". Pt stated he was working on disability and his cousin will be moving in with him in a few days.   A:Skin was assessed and found to be clear of any abnormal marks apart surgical scars: L-shoulder, Abdomen/ chest, L-leg. PT searched and no contraband found, POC and unit policies explained and understanding verbalized. Consents obtained. Food and fluids offered, and fluids accepted.  R: Pt had no additional questions or concerns.

## 2018-07-30 NOTE — H&P (Addendum)
Psychiatric Admission Assessment Adult  [name] MRN:  [id number] Date of Evaluation:  07/30/2018 Chief Complaint:  MDD Principal Diagnosis: <principal problem not specified> Diagnosis:  Active Problems:   Alcohol dependence with alcohol-induced mood disorder (HCC)   Severe recurrent major depression without psychotic features ([place])  History of Present Illness: Per assessment note:per assessment note by TTS: [name] an 38 y.o.malepresenting voluntarily to [facility] ED complaining of numerous physical complaints including severe headache, abdominal pain, and dizziness. Patient also reports worsening depression over the past 3 days and suicidal thoughts with a plan to overdose. He is unable to contract for safety at this time. Patient reports he was hospitalized at [facility] in May 2019 after an intentional overdose. He states he quit drinking alcohol from June 2019 until November 2019 when he relapsed. He reports daily alcohol and cocaine use and reports he last used on 07/28/18. UDS pending at time of assessment. Patient currently endorses withdrawal symptoms. Patient reports he was laid off from his job 3 weeks ago and has not had money to refill his prescriptions. Patient is seen at [facility] for therapy and medication management. He denies HI/AVH. Patient denies any current criminal charges or history of abuse.  Evaluation: [name] is awake alert and oriented x3.  Patient validates the above information provided his admission assessment.  Reports he has been doing well overall at the report he has been otherwise for the past 8 months.  We stated he recently started binge drinking lots of became suicidal.  Reports his family is attempting to help him with his current finances.  States he has plans to move in with his sister on Thursday.  Currently denying suicidal homicidal ideations.  Denies auditory visual hallucinations. See SRA patient management.  Will restart medications  where appropriate.  Support encouragement reassurance was provided  Associated Signs/Symptoms: Depression Symptoms:  depressed mood, hopelessness, suicidal thoughts without plan, (Hypo) Manic Symptoms:  Distractibility, Irritable Mood, Labiality of Mood, Anxiety Symptoms:  Excessive Worry, Psychotic Symptoms:  Hallucinations: None PTSD Symptoms: NA Total Time spent with patient: 15 minutes  Past Psychiatric History: MDD, anxiety, halluications and substance abuse use  Is the patient at risk to self? No.  Has the patient been a risk to self in the past 6 months? No.  Has the patient been a risk to self within the distant past? No.  Is the patient a risk to others? No.  Has the patient been a risk to others in the past 6 months? No.  Has the patient been a risk to others within the distant past? No.   Prior Inpatient Therapy:   Prior Outpatient Therapy:    Alcohol Screening: 1. How often do you have a drink containing alcohol?: 2 to 3 times a week 2. How many drinks containing alcohol do you have on a typical day when you are drinking?: 3 or 4 3. How often do you have six or more drinks on one occasion?: Monthly AUDIT-C Score: 6 4. How often during the last year have you found that you were not able to stop drinking once you had started?: Monthly 5. How often during the last year have you failed to do what was normally expected from you becasue of drinking?: Monthly 6. How often during the last year have you needed a first drink in the morning to get yourself going after a heavy drinking session?: Monthly 7. How often during the last year have you had a feeling of guilt of remorse  after drinking?: Monthly 8. How often during the last year have you been unable to remember what happened the night before because you had been drinking?: Monthly 9. Have you or someone else been injured as a result of your drinking?: Yes, but not in the last year 10. Has a relative or friend or a doctor  or another health worker been concerned about your drinking or suggested you cut down?: Yes, but not in the last year Alcohol Use Disorder Identification Test Final Score (AUDIT): 20 Intervention/Follow-up: Brief Advice Substance Abuse History in the last 12 months:  Yes.   Consequences of Substance Abuse: NA Previous Psychotropic Medications: Yes  Psychological Evaluations: Yes  Past Medical History:  Past Medical History:  Diagnosis Date  . Angina   . Anxiety    panic attack  . Bipolar 1 disorder ([place])   . Breast CA ([place]) dx'd 2009   bil w/ bil masectomy and oral meds  . Cancer [facility])    kidney cancer  . Coronary artery disease   . Depression   . H/O suicide attempt 2015   overdose  . Headache(784.0)   . Hypercholesteremia   . Hypertension   . Liver cirrhosis ([place])   . Pancreatitis   . Peripheral vascular disease [facility]) April 2011   Left Pop  . Schizophrenia ([place])   . Seizures ([place])    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  . Shortness of breath     Past Surgical History:  Procedure Laterality Date  . BREAST SURGERY    . BREAST SURGERY     bilateral breast silocone  removal  . CHEST SURGERY    . left kidney removal    . left leg surgery     "popiteal artery clogged"  . MASTECTOMY Bilateral   . NEPHRECTOMY Left   . ORIF CLAVICULAR FRACTURE Left 08/10/2017   Procedure: OPEN REDUCTION INTERNAL FIXATION (ORIF) LEFT CLAVICLE FRACTURE WITH RECONSTRUCTION OF CORACOCLAVICULAR LIGAMENT;  Surgeon: Leandrew Koyanagi, MD;  Location: [facility];  Service: Orthopedics;  Laterality: Left;  . RECONSTRUCTION OF CORACOCLAVICULAR LIGAMENT Left 08/10/2017   Procedure: RECONSTRUCTION OF CORACOCLAVICULAR LIGAMENT;  Surgeon: Leandrew Koyanagi, MD;  Location: [facility];  Service: Orthopedics;  Laterality: Left;   Family History:  Family History  Problem Relation Age of Onset  . Stroke Other   . Cancer Other   . Hyperlipidemia Mother   . Hypertension Mother    Family Psychiatric  History:  Tobacco  Screening:   Social History:  Social History   Substance and Sexual Activity  Alcohol Use Yes   Comment: 5 bottle of wine daily, last consumption 1 hr PTA     Social History   Substance and Sexual Activity  Drug Use No  . Frequency: 1.0 times per week  . Types: "Crack" cocaine, Cocaine   Comment: clean 3 weeks    Additional Social History: Marital status: Single Are you sexually active?: No What is your sexual orientation?: Gay Does patient have children?: No                         Allergies:   Allergies  Allergen Reactions  . Codeine Hives, Itching, Swelling and Other (See Comments)    Does not impair breathing, however  . Penicillins Swelling and Other (See Comments)    Has patient had a PCN reaction causing immediate rash, facial/tongue/throat swelling, SOB or lightheadedness with hypotension: Yes Has patient had a PCN reaction causing severe rash  involving mucus membranes or skin necrosis: Yes Has patient had a PCN reaction that required hospitalization Yes-ed visit Has patient had a PCN reaction occurring within the last 10 years: Yes If all of the above answers are "NO", then may proceed with Cephalosporin use.   Marland Kitchen Morphine Itching  . Coconut Flavor Itching and Swelling  . Coconut Oil Itching, Swelling and Other (See Comments)    Cannot take with some of his meds (also)  . Grapefruit Concentrate Other (See Comments)    Cannot take with some of his meds  . Morphine And Related Itching and Swelling  . Oxycodone Itching and Swelling  . Norco [Hydrocodone-Acetaminophen] Itching and Rash   Lab Results:  Results for orders placed or performed during the hospital encounter of 07/29/18 (from the past 48 hour(s))  Urine rapid drug screen (hosp performed)not at [facility]     Status: Abnormal   Collection Time: 07/29/18 11:45 PM  Result Value Ref Range   Opiates NONE DETECTED NONE DETECTED   Cocaine POSITIVE (A) NONE DETECTED   Benzodiazepines POSITIVE (A) NONE  DETECTED   Amphetamines NONE DETECTED NONE DETECTED   Tetrahydrocannabinol NONE DETECTED NONE DETECTED   Barbiturates NONE DETECTED NONE DETECTED    Comment: (NOTE) DRUG SCREEN FOR MEDICAL PURPOSES ONLY.  IF CONFIRMATION IS NEEDED FOR ANY PURPOSE, NOTIFY LAB WITHIN 5 DAYS. LOWEST DETECTABLE LIMITS FOR URINE DRUG SCREEN Drug Class                     Cutoff (ng/mL) Amphetamine and metabolites    1000 Barbiturate and metabolites    200 Benzodiazepine                 341 Tricyclics and metabolites     300 Opiates and metabolites        300 Cocaine and metabolites        300 THC                            50 Performed at [facility], [place] [address], [place], [place] [address]   Urinalysis, Routine w reflex microscopic     Status: Abnormal   Collection Time: 07/30/18  6:00 AM  Result Value Ref Range   Color, Urine YELLOW YELLOW   APPearance CLEAR CLEAR   Specific Gravity, Urine 1.023 1.005 - 1.030   pH 7.0 5.0 - 8.0   Glucose, UA NEGATIVE NEGATIVE mg/dL   Hgb urine dipstick NEGATIVE NEGATIVE   Bilirubin Urine NEGATIVE NEGATIVE   Ketones, ur 5 (A) NEGATIVE mg/dL   Protein, ur NEGATIVE NEGATIVE mg/dL   Nitrite NEGATIVE NEGATIVE   Leukocytes, UA NEGATIVE NEGATIVE    Comment: Performed at [facility] [address], [place], [place] [address]  Acetaminophen level     Status: Abnormal   Collection Time: 07/30/18  6:28 AM  Result Value Ref Range   Acetaminophen (Tylenol), Serum <10 (L) 10 - 30 ug/mL    Comment: (NOTE) Therapeutic concentrations vary significantly. A range of 10-30 ug/mL  may be an effective concentration for many patients. However, some  are best treated at concentrations outside of this range. Acetaminophen concentrations >150 ug/mL at 4 hours after ingestion  and >50 ug/mL at 12 hours after ingestion are often associated with  toxic reactions. Performed at [facility], [place] [address] [place], [place] [address]   Lipid panel     Status: Abnormal  Collection Time: 07/30/18  6:28 AM  Result Value Ref Range   Cholesterol 371 (H) 0 - 200 mg/dL   Triglycerides 70 <150 mg/dL   HDL 114 >40 mg/dL   Total CHOL/HDL Ratio 3.3 RATIO   VLDL 14 0 - 40 mg/dL   LDL Cholesterol 243 (H) 0 - 99 mg/dL    Comment:        Total Cholesterol/HDL:CHD Risk Coronary Heart Disease Risk Table                     Men   Women  1/2 Average Risk   3.4   3.3  Average Risk       5.0   4.4  2 X Average Risk   9.6   7.1  3 X Average Risk  23.4   11.0        Use the calculated Patient Ratio above and the CHD Risk Table to determine the patient's CHD Risk.        ATP III CLASSIFICATION (LDL):  <100     mg/dL   Optimal  100-129  mg/dL   Near or Above                    Optimal  130-159  mg/dL   Borderline  160-189  mg/dL   High  >190     mg/dL   Very High Performed at [facility] [address], [place], [place] [address]   Hepatic function panel     Status: Abnormal   Collection Time: 07/30/18  6:28 AM  Result Value Ref Range   Total Protein 6.8 6.5 - 8.1 g/dL   Albumin 3.9 3.5 - 5.0 g/dL   AST 180 (H) 15 - 41 U/L   ALT 93 (H) 0 - 44 U/L   Alkaline Phosphatase 38 38 - 126 U/L   Total Bilirubin 1.5 (H) 0.3 - 1.2 mg/dL   Bilirubin, Direct 0.2 0.0 - 0.2 mg/dL   Indirect Bilirubin 1.3 (H) 0.3 - 0.9 mg/dL    Comment: Performed at [facility], [place] [address] [place], [place] [address]  TSH     Status: None   Collection Time: 07/30/18  6:28 AM  Result Value Ref Range   TSH 0.910 0.350 - 4.500 uIU/mL    Comment: Performed by a 3rd Generation assay with a functional sensitivity of <=0.01 uIU/mL. Performed at [facility], [place] [address] [place],  [address]     Blood Alcohol level:  Lab Results  Component Value Date   ETH <10 06/06/2018   ETH <10 24/40/1027    Metabolic Disorder Labs:  Lab Results  Component  Value Date   HGBA1C 5.2 07/29/2017   MPG 102.54 07/29/2017   MPG 105 09/11/2014   No results found for: PROLACTIN Lab Results  Component Value Date   CHOL 371 (H) 07/30/2018   TRIG 70 07/30/2018   HDL 114 07/30/2018   CHOLHDL 3.3 07/30/2018   VLDL 14 07/30/2018   LDLCALC 243 (H) 07/30/2018   LDLCALC 135 (H) 07/29/2017    Current Medications: Current Facility-Administered Medications  Medication Dose Route Frequency Provider Last Rate Last Dose  . alum & mag hydroxide-simeth (MAALOX/MYLANTA) 200-200-20 MG/5ML suspension 30 mL  30 mL Oral Q4H PRN Lindon Romp A, NP      . amLODipine (NORVASC) tablet 5 mg  5 mg Oral Daily [name], NP   5 mg at 07/30/18 1002  .  benzocaine (ORAJEL) 10 % mucosal gel   Mouth/Throat PRN Lindon Romp A, NP      . benzocaine (ORAJEL) 10 % mucosal gel           . busPIRone (BUSPAR) tablet 10 mg  10 mg Oral TID [name], NP   10 mg at 07/30/18 1402  . [START ON 07/31/2018] Influenza vac split quadrivalent PF (FLUARIX) injection 0.5 mL  0.5 mL Intramuscular Tomorrow-1000 Cobos, Fernando A, MD      . lamoTRIgine (LAMICTAL) tablet 25 mg  25 mg Oral BID Cobos, Fernando A, MD      . loperamide (IMODIUM) capsule 2-4 mg  2-4 mg Oral PRN [name], NP      . Derrill Memo ON 08/01/2018] LORazepam (ATIVAN) tablet 0.5 mg  0.5 mg Oral BID [name], NP       Followed by  . LORazepam (ATIVAN) tablet 0.5 mg  0.5 mg Oral QID Lindon Romp A, NP   0.5 mg at 07/30/18 1402   Followed by  . [START ON 07/31/2018] LORazepam (ATIVAN) tablet 0.5 mg  0.5 mg Oral TID [name], NP       Followed by  . [START ON 08/02/2018] LORazepam (ATIVAN) tablet 1 mg  1 mg Oral Daily Lindon Romp A, NP      . LORazepam (ATIVAN) tablet 1 mg  1 mg Oral Q6H PRN Lindon Romp A, NP      . magnesium hydroxide (MILK OF MAGNESIA) suspension 30 mL  30 mL Oral Daily PRN Lindon Romp A, NP      . multivitamin with minerals tablet 1 tablet  1 tablet Oral Daily Lindon Romp A, NP   1 tablet  at 07/30/18 1402  . ondansetron (ZOFRAN-ODT) disintegrating tablet 4 mg  4 mg Oral Q4H PRN Lindon Romp A, NP      . thiamine (VITAMIN B-1) tablet 100 mg  100 mg Oral Daily Lindon Romp A, NP   100 mg at 07/30/18 1402  . traZODone (DESYREL) tablet 50 mg  50 mg Oral QHS PRN Cobos, Myer Peer, MD       PTA Medications: Medications Prior to Admission  Medication Sig Dispense Refill Last Dose  . amLODipine (NORVASC) 5 MG tablet Take 1 tablet (5 mg total) by mouth daily. 30 tablet 0 Past Week at Unknown time  . busPIRone (BUSPAR) 10 MG tablet Take 1 tablet (10 mg total) by mouth 3 (three) times daily. 60 tablet 0 Past Week at Unknown time  . hydrOXYzine (ATARAX/VISTARIL) 25 MG tablet Take 25 mg by mouth 2 (two) times daily as needed for anxiety.   Past Week at Unknown time  . Ibuprofen 200 MG CAPS Take 400 mg by mouth every 8 (eight) hours as needed (for pain).    Unk at Unknown time  . meclizine (ANTIVERT) 25 MG tablet Take 1 tablet (25 mg total) by mouth 2 (two) times daily as needed for dizziness. 20 tablet 0   . ondansetron (ZOFRAN ODT) 8 MG disintegrating tablet Take 1 tablet (8 mg total) by mouth every 8 (eight) hours as needed for nausea or vomiting. 10 tablet 0 unk at unk  . ondansetron (ZOFRAN) 4 MG tablet Take 1 tablet (4 mg total) by mouth every 6 (six) hours. 10 tablet 0   . traZODone (DESYREL) 100 MG tablet Take 250 mg by mouth at bedtime as needed for sleep.   unk at [employer]  . ziprasidone (GEODON) 20 MG capsule Take 20  mg by mouth 2 (two) times daily with a meal.    Past Week at Unknown time    Musculoskeletal: Strength & Muscle Tone: within normal limits Gait & Station: normal Patient leans: N/A  Psychiatric Specialty Exam: Physical Exam  Nursing note and vitals reviewed. Constitutional: He appears well-developed.  HENT:  Head: Normocephalic.  Psychiatric: He has a normal mood and affect. His behavior is normal.    Review of Systems  Psychiatric/Behavioral: Positive for  depression and suicidal ideas. The patient is nervous/anxious.   All other systems reviewed and are negative.   Blood pressure 125/80, pulse 84, temperature 98.4 F (36.9 C), temperature source Oral, resp. rate 16, height '5\' 6"'  (1.676 m), weight 69.4 kg.Body mass index is 24.69 kg/m.  General Appearance: Casual  Eye Contact:  Fair  Speech:  Clear and Coherent  Volume:  Normal  Mood:  Anxious and Depressed  Affect:  Congruent  Thought Process:  Coherent  Orientation:  Full (Time, Place, and Person)  Thought Content:  WDL  Suicidal Thoughts:  No  Homicidal Thoughts:  No  Memory:  Immediate;   Fair Recent;   Fair Remote;   Fair  Judgement:  Fair  Insight:  Fair  Psychomotor Activity:  Normal  Concentration:  Concentration: Fair  Recall:  [employer] of Knowledge:  Fair  Language:  Fair  Akathisia:  No  Handed:  Right  AIMS (if indicated):     Assets:  Communication Skills Desire for Improvement Resilience Social Support  ADL's:  Intact  Cognition:  WNL  Sleep:       Treatment Plan Summary: Daily contact with patient to assess and evaluate symptoms and progress in treatment and Medication management  Observation Level/Precautions:  15 minute checks  Laboratory:  CBC Chemistry Profile HCG UDS UA  Psychotherapy:  Individual  and group session  Medications:  See SRA   Consultations:  CSW and Psychiatry   Discharge Concerns:  Safety, stabilization, and risk of access to medication and medication stabilization   Estimated LOS: 5-7 days  Other:     Physician Treatment Plan for Primary Diagnosis: <principal problem not specified> Long Term Goal(s): Improvement in symptoms so as ready for discharge  Short Term Goals: Ability to identify changes in lifestyle to reduce recurrence of condition will improve, Ability to demonstrate self-control will improve, Ability to maintain clinical measurements within normal limits will improve and Compliance with prescribed medications  will improve  Physician Treatment Plan for Secondary Diagnosis: Active Problems:   Alcohol dependence with alcohol-induced mood disorder (HCC)   Severe recurrent major depression without psychotic features ([place])  Long Term Goal(s): Improvement in symptoms so as ready for discharge  Short Term Goals: Ability to verbalize feelings will improve, Ability to demonstrate self-control will improve, Ability to identify and develop effective coping behaviors will improve, Ability to maintain clinical measurements within normal limits will improve, Compliance with prescribed medications will improve and Ability to identify triggers associated with substance abuse/mental health issues will improve  I certify that inpatient services furnished can reasonably be expected to improve the patient's condition.    [name], NP 1/12/20203:53 PM   I have discussed case with NP and have met with patient  Agree with NP note and assessment  37, single, currently living alone, reports he recently lost his job and is currently financially dependent on his family. He presented to hospital voluntarily for worsening depression, with suicidal ideations. He also reported headache, dizziness, which he attributes to  history of vertigo. States these symptoms are better today. He reports he had been sober x 7 months, relapsed about three weeks ago. ( Alcohol, Cocaine). States he initially started drinking 2 glasses of wine, but states it has progressed to heavy , daily drinking . He has been off his psychiatric medications x 2 weeks, due to difficulty affording refills.He had been prescribed Geodon, Buspar, Buspar, Norvasc for HTN, Antivert PRN for dizziness . States medications were well tolerated and helpful.  Patient has history of prior psychiatric admissions, and has been diagnosed with  MDD in the past. History of suicide attempt by overdosing . Reports past history of hallucinations,related to episodes of alcohol use  or withdrawal, but otherwise denies history of psychosis. He is known to [facility] from prior psychiatric admissions , last time January 2019, at which time was admitted for depression, suicidal ideations, relapse. At the time was discharged on Geodon, Trazodone, Depakoter Reports history of Breast Surgery and Nephrectomy for Malignancies. Currently in remission. History of seizures, which he states he has been told are alcohol withdrawal related, but states he had a seizure in the past about 2 months into abstinence. States he is prescribed Lamictal for seizures ( at 25 mgrs BID?) , which he states he took up to a week ago. 10/12 QTc 495  Dx- Alcohol Dependence, Cocaine Abuse ,Alcohol Induced Mood Disorder, depressed versus MDD.   Plan- inpatient admission. Has been started on Ativan detox protocol to address potential alcohol WDL symptoms Has been restarted on Buspar and on Geodon, which he states he was taking prior to admission with good response and no side effects. He is also restarted on Lamictal 25 mgrs BID for history of seizures ( which he states he was taking regularly prior to admission, without side effects, without any rash). Decrease Trazodone dose to minimize risk of sedation. He is continued on Norvasc for HTN Check EKG to monitor QTc  Patient requests HIV testing . States he was tested negative a little more than a year ago, but wants to be retested, denies any STD symptoms .

## 2018-07-31 MED ORDER — HYDROXYZINE HCL 25 MG PO TABS
25.0000 mg | ORAL_TABLET | Freq: Three times a day (TID) | ORAL | Status: DC
  Administered 2018-07-31 – 2018-08-02 (×7): 25 mg via ORAL
  Filled 2018-07-31 (×3): qty 1
  Filled 2018-07-31: qty 21
  Filled 2018-07-31 (×3): qty 1
  Filled 2018-07-31: qty 21
  Filled 2018-07-31 (×2): qty 1
  Filled 2018-07-31: qty 21
  Filled 2018-07-31 (×5): qty 1

## 2018-07-31 MED ORDER — IBUPROFEN 600 MG PO TABS: 600.0000 mg | ORAL_TABLET | Freq: Four times a day (QID) | ORAL | Status: DC | PRN

## 2018-07-31 MED ORDER — LAMOTRIGINE 25 MG PO TABS
50.0000 mg | ORAL_TABLET | Freq: Two times a day (BID) | ORAL | Status: DC
  Administered 2018-07-31 – 2018-08-02 (×5): 50 mg via ORAL
  Filled 2018-07-31 (×4): qty 2
  Filled 2018-07-31: qty 28
  Filled 2018-07-31: qty 2
  Filled 2018-07-31: qty 28
  Filled 2018-07-31 (×2): qty 2

## 2018-07-31 MED ORDER — AMLODIPINE BESYLATE 10 MG PO TABS
10.0000 mg | ORAL_TABLET | Freq: Every day | ORAL | Status: DC
  Administered 2018-07-31 – 2018-08-02 (×3): 10 mg via ORAL
  Filled 2018-07-31: qty 7
  Filled 2018-07-31 (×4): qty 1

## 2018-07-31 MED ORDER — TRAZODONE HCL 100 MG PO TABS
250.0000 mg | ORAL_TABLET | Freq: Every evening | ORAL | Status: DC | PRN
  Administered 2018-07-31 – 2018-08-01 (×2): 250 mg via ORAL
  Filled 2018-07-31: qty 3
  Filled 2018-07-31: qty 18
  Filled 2018-07-31: qty 3

## 2018-07-31 NOTE — Progress Notes (Signed)
[facility] MD Progress Note  07/31/2018 7:44 AM [name]  MRN:  [id number] Subjective:    Patient is well-known to the service he is a 38 year old male with a history of substance abuse, presenting with a negligible alcohol level but reporting daily drinking, drug screen positive for benzodiazepines and cocaine, reporting he would like to stay till Wednesday to make sure he has housing by Thursday, has had financial difficulties and presented with suicidal thoughts and a cluster of somatic symptoms. States his weekend has been good he has been having no suicidal thoughts today and he reports no somatic symptoms.  Feels his depression is lifting with current measures but is requesting an escalation in trazodone to his outpatient dose of 250 mg. Fully engaged and cooperative without thoughts of self-harm today and contracting here  Principal Problem: Depression, polysubstance abuse, recent alcohol dependence, borderline personality Diagnosis: Active Problems:   Alcohol dependence with alcohol-induced mood disorder (HCC)   Severe recurrent major depression without psychotic features ([place])  Total Time spent with patient: 20 minutes Past Medical History:  Past Medical History:  Diagnosis Date  . Angina   . Anxiety    panic attack  . Bipolar 1 disorder ([place])   . Breast CA ([place]) dx'd 2009   bil w/ bil masectomy and oral meds  . Cancer [facility])    kidney cancer  . Coronary artery disease   . Depression   . H/O suicide attempt 2015   overdose  . Headache(784.0)   . Hypercholesteremia   . Hypertension   . Liver cirrhosis ([place])   . Pancreatitis   . Peripheral vascular disease [facility]) April 2011   Left Pop  . Schizophrenia ([place])   . Seizures ([place])    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  . Shortness of breath     Past Surgical History:  Procedure Laterality Date  . BREAST SURGERY    . BREAST SURGERY     bilateral breast silocone  removal  . CHEST SURGERY    . left kidney removal    . left leg  surgery     "popiteal artery clogged"  . MASTECTOMY Bilateral   . NEPHRECTOMY Left   . ORIF CLAVICULAR FRACTURE Left 08/10/2017   Procedure: OPEN REDUCTION INTERNAL FIXATION (ORIF) LEFT CLAVICLE FRACTURE WITH RECONSTRUCTION OF CORACOCLAVICULAR LIGAMENT;  Surgeon: Leandrew Koyanagi, MD;  Location: [facility];  Service: Orthopedics;  Laterality: Left;  . RECONSTRUCTION OF CORACOCLAVICULAR LIGAMENT Left 08/10/2017   Procedure: RECONSTRUCTION OF CORACOCLAVICULAR LIGAMENT;  Surgeon: Leandrew Koyanagi, MD;  Location: [facility];  Service: Orthopedics;  Laterality: Left;   Family History:  Family History  Problem Relation Age of Onset  . Stroke Other   . Cancer Other   . Hyperlipidemia Mother   . Hypertension Mother   Social History:  Social History   Substance and Sexual Activity  Alcohol Use Yes   Comment: 5 bottle of wine daily, last consumption 1 hr PTA     Social History   Substance and Sexual Activity  Drug Use No  . Frequency: 1.0 times per week  . Types: "Crack" cocaine, Cocaine   Comment: clean 3 weeks    Social History   Socioeconomic History  . Marital status: Single    Spouse name: Not on file  . Number of children: Not on file  . Years of education: Not on file  . Highest education level: Not on file  Occupational History  . Not on file  Social Needs  .  Financial resource strain: Not on file  . Food insecurity:    Worry: Not on file    Inability: Not on file  . Transportation needs:    Medical: Not on file    Non-medical: Not on file  Tobacco Use  . Smoking status: Former Research scientist (life sciences)  . Smokeless tobacco: Never Used  . Tobacco comment: smoked on and off 16 years- quit 2016  Substance and Sexual Activity  . Alcohol use: Yes    Comment: 5 bottle of wine daily, last consumption 1 hr PTA  . Drug use: No    Frequency: 1.0 times per week    Types: "Crack" cocaine, Cocaine    Comment: clean 3 weeks  . Sexual activity: Yes    Birth control/protection: Condom    Comment: anal   Lifestyle  . Physical activity:    Days per week: Not on file    Minutes per session: Not on file  . Stress: Not on file  Relationships  . Social connections:    Talks on phone: Not on file    Gets together: Not on file    Attends religious service: Not on file    Active member of club or organization: Not on file    Attends meetings of clubs or organizations: Not on file    Relationship status: Not on file  Other Topics Concern  . Not on file  Social History Narrative   ** Merged History Encounter **       ** Merged History Encounter **       Additional Social History:                         Sleep: Good  Appetite:  Fair  Current Medications: Current Facility-Administered Medications  Medication Dose Route Frequency Provider Last Rate Last Dose  . alum & mag hydroxide-simeth (MAALOX/MYLANTA) 200-200-20 MG/5ML suspension 30 mL  30 mL Oral Q4H PRN Lindon Romp A, NP      . amLODipine (NORVASC) tablet 5 mg  5 mg Oral Daily [name], NP   5 mg at 07/30/18 1002  . benzocaine (ORAJEL) 10 % mucosal gel   Mouth/Throat PRN Lindon Romp A, NP      . busPIRone (BUSPAR) tablet 10 mg  10 mg Oral TID [name], NP   10 mg at 07/30/18 1855  . ibuprofen (ADVIL,MOTRIN) tablet 600 mg  600 mg Oral Q6H PRN [name], NP      . Influenza vac split quadrivalent PF (FLUARIX) injection 0.5 mL  0.5 mL Intramuscular Tomorrow-1000 Cobos, Fernando A, MD      . lamoTRIgine (LAMICTAL) tablet 25 mg  25 mg Oral BID Cobos, Myer Peer, MD   25 mg at 07/30/18 1855  . loperamide (IMODIUM) capsule 2-4 mg  2-4 mg Oral PRN [name], NP      . Derrill Memo ON 08/01/2018] LORazepam (ATIVAN) tablet 0.5 mg  0.5 mg Oral BID [name], NP       Followed by  . LORazepam (ATIVAN) tablet 0.5 mg  0.5 mg Oral TID [name], NP       Followed by  . [START ON 08/02/2018] LORazepam (ATIVAN) tablet 1 mg  1 mg Oral Daily Lindon Romp A, NP      . LORazepam (ATIVAN) tablet 1 mg  1 mg Oral  Q6H PRN Lindon Romp A, NP   1 mg at 07/30/18 2134  . magnesium hydroxide (  MILK OF MAGNESIA) suspension 30 mL  30 mL Oral Daily PRN Lindon Romp A, NP      . multivitamin with minerals tablet 1 tablet  1 tablet Oral Daily Lindon Romp A, NP   1 tablet at 07/30/18 1402  . thiamine (VITAMIN B-1) tablet 100 mg  100 mg Oral Daily Lindon Romp A, NP   100 mg at 07/30/18 1402  . traZODone (DESYREL) tablet 250 mg  250 mg Oral QHS PRN [name], MD        Lab Results:  Results for orders placed or performed during the hospital encounter of 07/29/18 (from the past 48 hour(s))  Urine rapid drug screen (hosp performed)not at [facility]     Status: Abnormal   Collection Time: 07/29/18 11:45 PM  Result Value Ref Range   Opiates NONE DETECTED NONE DETECTED   Cocaine POSITIVE (A) NONE DETECTED   Benzodiazepines POSITIVE (A) NONE DETECTED   Amphetamines NONE DETECTED NONE DETECTED   Tetrahydrocannabinol NONE DETECTED NONE DETECTED   Barbiturates NONE DETECTED NONE DETECTED    Comment: (NOTE) DRUG SCREEN FOR MEDICAL PURPOSES ONLY.  IF CONFIRMATION IS NEEDED FOR ANY PURPOSE, NOTIFY LAB WITHIN 5 DAYS. LOWEST DETECTABLE LIMITS FOR URINE DRUG SCREEN Drug Class                     Cutoff (ng/mL) Amphetamine and metabolites    1000 Barbiturate and metabolites    200 Benzodiazepine                 956 Tricyclics and metabolites     300 Opiates and metabolites        300 Cocaine and metabolites        300 THC                            50 Performed at [facility], [place] [address] [place], [place] [address]   Urinalysis, Routine w reflex microscopic     Status: Abnormal   Collection Time: 07/30/18  6:00 AM  Result Value Ref Range   Color, Urine YELLOW YELLOW   APPearance CLEAR CLEAR   Specific Gravity, Urine 1.023 1.005 - 1.030   pH 7.0 5.0 - 8.0   Glucose, UA NEGATIVE NEGATIVE mg/dL   Hgb urine dipstick NEGATIVE NEGATIVE   Bilirubin Urine NEGATIVE NEGATIVE   Ketones, ur 5 (A)  NEGATIVE mg/dL   Protein, ur NEGATIVE NEGATIVE mg/dL   Nitrite NEGATIVE NEGATIVE   Leukocytes, UA NEGATIVE NEGATIVE    Comment: Performed at [facility] [address] [place], [place] [address]  Acetaminophen level     Status: Abnormal   Collection Time: 07/30/18  6:28 AM  Result Value Ref Range   Acetaminophen (Tylenol), Serum <10 (L) 10 - 30 ug/mL    Comment: (NOTE) Therapeutic concentrations vary significantly. A range of 10-30 ug/mL  may be an effective concentration for many patients. However, some  are best treated at concentrations outside of this range. Acetaminophen concentrations >150 ug/mL at 4 hours after ingestion  and >50 ug/mL at 12 hours after ingestion are often associated with  toxic reactions. Performed at [facility], [place] [address], [place], [place] [address]   Hemoglobin A1c     Status: None   Collection Time: 07/30/18  6:28 AM  Result Value Ref Range   Hgb A1c MFr Bld 4.9 4.8 - 5.6 %    Comment: (  NOTE) Pre diabetes:          5.7%-6.4% Diabetes:              >6.4% Glycemic control for   <7.0% adults with diabetes    Mean Plasma Glucose 93.93 mg/dL    Comment: Performed at [facility] [address] [place], [place] [address]  Lipid panel     Status: Abnormal   Collection Time: 07/30/18  6:28 AM  Result Value Ref Range   Cholesterol 371 (H) 0 - 200 mg/dL   Triglycerides 70 <150 mg/dL   HDL 114 >40 mg/dL   Total CHOL/HDL Ratio 3.3 RATIO   VLDL 14 0 - 40 mg/dL   LDL Cholesterol 243 (H) 0 - 99 mg/dL    Comment:        Total Cholesterol/HDL:CHD Risk Coronary Heart Disease Risk Table                     Men   Women  1/2 Average Risk   3.4   3.3  Average Risk       5.0   4.4  2 X Average Risk   9.6   7.1  3 X Average Risk  23.4   11.0        Use the calculated Patient Ratio above and the CHD Risk Table to determine the patient's CHD Risk.        ATP III CLASSIFICATION (LDL):  <100     mg/dL    Optimal  100-129  mg/dL   Near or Above                    Optimal  130-159  mg/dL   Borderline  160-189  mg/dL   High  >190     mg/dL   Very High Performed at [facility] [address], [place], [place] [address]   Hepatic function panel     Status: Abnormal   Collection Time: 07/30/18  6:28 AM  Result Value Ref Range   Total Protein 6.8 6.5 - 8.1 g/dL   Albumin 3.9 3.5 - 5.0 g/dL   AST 180 (H) 15 - 41 U/L   ALT 93 (H) 0 - 44 U/L   Alkaline Phosphatase 38 38 - 126 U/L   Total Bilirubin 1.5 (H) 0.3 - 1.2 mg/dL   Bilirubin, Direct 0.2 0.0 - 0.2 mg/dL   Indirect Bilirubin 1.3 (H) 0.3 - 0.9 mg/dL    Comment: Performed at [facility], [place] [address] [place], [place] [address]  TSH     Status: None   Collection Time: 07/30/18  6:28 AM  Result Value Ref Range   TSH 0.910 0.350 - 4.500 uIU/mL    Comment: Performed by a 3rd Generation assay with a functional sensitivity of <=0.01 uIU/mL. Performed at [facility], [place] [address], [place],  [address]     Blood Alcohol level:  Lab Results  Component Value Date   ETH <10 06/06/2018   ETH <10 27/12/2374    Metabolic Disorder Labs: Lab Results  Component Value Date   HGBA1C 4.9 07/30/2018   MPG 93.93 07/30/2018   MPG 102.54 07/29/2017   No results found for: PROLACTIN Lab Results  Component Value Date   CHOL 371 (H) 07/30/2018   TRIG 70 07/30/2018   HDL 114 07/30/2018   CHOLHDL 3.3 07/30/2018   VLDL 14 07/30/2018   LDLCALC 243 (H) 07/30/2018  LDLCALC 135 (H) 07/29/2017    Physical Findings: AIMS: Facial and Oral Movements Muscles of Facial Expression: None, normal Lips and Perioral Area: None, normal Jaw: None, normal Tongue: None, normal,Extremity Movements Upper (arms, wrists, hands, fingers): None, normal Lower (legs, knees, ankles, toes): None, normal, Trunk Movements Neck, shoulders, hips: None, normal, Overall Severity Severity of abnormal  movements (highest score from questions above): None, normal Incapacitation due to abnormal movements: None, normal [name] awareness of abnormal movements (rate only patient's report): No Awareness, Dental Status Current problems with teeth and/or dentures?: Yes(missing) Does patient usually wear dentures?: No  CIWA:  CIWA-Ar Total: 2 COWS:  COWS Total Score: 4  Musculoskeletal: Strength & Muscle Tone: within normal limits Gait & Station: normal Patient leans: N/A  Psychiatric Specialty Exam: Physical Exam  ROS  Blood pressure (!) 136/115, pulse 97, temperature 98.4 F (36.9 C), temperature source Oral, resp. rate 16, height 5\' 6"  (1.676 m), weight 69.4 kg.Body mass index is 24.69 kg/m.  General Appearance: Casual  Eye Contact:  Good  Speech:  Clear and Coherent  Volume:  Normal  Mood:  Dysphoric  Affect:  Appropriate  Thought Process:  Coherent  Orientation:  full  Thought Content:  Logical  Suicidal Thoughts:  No  Homicidal Thoughts:  No  Memory:  Immediate;   Good  Judgement:  Good  Insight:  Good  Psychomotor Activity:  Normal  Concentration:  Concentration: Good  Recall:  Good  Fund of Knowledge:  Good  Language:  Good  Akathisia:  Negative  Handed:  Right  AIMS (if indicated):     Assets:  Desire for Improvement Leisure Time Physical Health Resilience  ADL's:  Intact  Cognition:  WNL  Sleep:  Number of Hours: 3     Treatment Plan Summary: Daily contact with patient to assess and evaluate symptoms and progress in treatment, Medication management and Plan Continue cognitive therapy for depressive symptoms continue lorazepam for Ativan withdrawal, add antihypertensive for hypertension, escalate trazodone at his request continue cognitive and rehab based groups  [name], MD 07/31/2018, 7:44 AM

## 2018-07-31 NOTE — BHH Group Notes (Signed)
LCSW Group Therapy Note   07/31/2018 1:15pm   Type of Therapy and Topic:  Group Therapy:  Overcoming Obstacles   Participation Level:  Did Not Attend--pt invited. Chose to remain in bed.   Description of Group:    In this group patients will be encouraged to explore what they see as obstacles to their own wellness and recovery. They will be guided to discuss their thoughts, feelings, and behaviors related to these obstacles. The group will process together ways to cope with barriers, with attention given to specific choices patients can make. Each patient will be challenged to identify changes they are motivated to make in order to overcome their obstacles. This group will be process-oriented, with patients participating in exploration of their own experiences as well as giving and receiving support and challenge from other group members.   Therapeutic Goals: 1. Patient will identify personal and current obstacles as they relate to admission. 2. Patient will identify barriers that currently interfere with their wellness or overcoming obstacles.  3. Patient will identify feelings, thought process and behaviors related to these barriers. 4. Patient will identify two changes they are willing to make to overcome these obstacles:      Summary of Patient Progress   x   Therapeutic Modalities:   Cognitive Behavioral Therapy Solution Focused Therapy Motivational Interviewing Relapse Prevention Therapy  Avelina Laine, LCSW 07/31/2018 11:47 AM

## 2018-07-31 NOTE — Progress Notes (Signed)
Attended group 

## 2018-07-31 NOTE — Progress Notes (Addendum)
Patient ID: [name], male   DOB: Jul 30, 1980, 38 y.o.   MRN: [id number]  Pt currently presents with a worried affect and restless behavior. Pt reports to writer that their goal is to "feel better." Pt has concerns about being restarted on the medications he was taking PTA. Pt reports good sleep with current medication regimen. Reports ongoing anxiety and chronic itching, requests Vistaril.   Pt provided with medications per providers orders. Pt's labs and vitals were monitored throughout the night. Pt supported emotionally and encouraged to express concerns and questions. Pt educated on medications. MD notified of patients request, see Notes. Will hold due to high QT.   Pt's safety ensured with 15 minute and environmental checks. Pt reports passive SI, no plan while at [facility]. Pt currently denies HI and A/V hallucinations. Pt verbally agrees to seek staff if SI worsens, HI or A/VH occurs and to consult with staff before acting on any harmful thoughts. Will continue POC.

## 2018-07-31 NOTE — Tx Team (Signed)
Interdisciplinary Treatment and Diagnostic Plan Update  07/31/2018 Time of Session: [place] MRN: [id number]  Principal Diagnosis: MDD, recurrent, severe, with psychotic features  Secondary Diagnoses: Active Problems:   Alcohol dependence with alcohol-induced mood disorder (HCC)   Severe recurrent major depression without psychotic features ([place])   Current Medications:  Current Facility-Administered Medications  Medication Dose Route Frequency Provider Last Rate Last Dose  . alum & mag hydroxide-simeth (MAALOX/MYLANTA) 200-200-20 MG/5ML suspension 30 mL  30 mL Oral Q4H PRN Lindon Romp A, NP      . amLODipine (NORVASC) tablet 10 mg  10 mg Oral Daily [name], MD   10 mg at 07/31/18 0834  . benzocaine (ORAJEL) 10 % mucosal gel   Mouth/Throat PRN Lindon Romp A, NP      . busPIRone (BUSPAR) tablet 10 mg  10 mg Oral TID [name], NP   10 mg at 07/31/18 0834  . ibuprofen (ADVIL,MOTRIN) tablet 600 mg  600 mg Oral Q6H PRN [name], NP      . Influenza vac split quadrivalent PF (FLUARIX) injection 0.5 mL  0.5 mL Intramuscular Tomorrow-1000 Cobos, Fernando A, MD      . lamoTRIgine (LAMICTAL) tablet 50 mg  50 mg Oral BID [name], MD   50 mg at 07/31/18 1497  . loperamide (IMODIUM) capsule 2-4 mg  2-4 mg Oral PRN [name], NP      . Derrill Memo ON 08/01/2018] LORazepam (ATIVAN) tablet 0.5 mg  0.5 mg Oral BID [name], NP       Followed by  . LORazepam (ATIVAN) tablet 0.5 mg  0.5 mg Oral TID Lindon Romp A, NP   0.5 mg at 07/31/18 0835   Followed by  . [START ON 08/02/2018] LORazepam (ATIVAN) tablet 1 mg  1 mg Oral Daily Lindon Romp A, NP      . LORazepam (ATIVAN) tablet 1 mg  1 mg Oral Q6H PRN Lindon Romp A, NP   1 mg at 07/30/18 2134  . magnesium hydroxide (MILK OF MAGNESIA) suspension 30 mL  30 mL Oral Daily PRN Lindon Romp A, NP      . multivitamin with minerals tablet 1 tablet  1 tablet Oral Daily Lindon Romp A, NP   1 tablet at 07/31/18 0834  .  thiamine (VITAMIN B-1) tablet 100 mg  100 mg Oral Daily Lindon Romp A, NP   100 mg at 07/31/18 0834  . traZODone (DESYREL) tablet 250 mg  250 mg Oral QHS PRN [name], MD       PTA Medications: Medications Prior to Admission  Medication Sig Dispense Refill Last Dose  . amLODipine (NORVASC) 5 MG tablet Take 1 tablet (5 mg total) by mouth daily. 30 tablet 0 Past Week at Unknown time  . busPIRone (BUSPAR) 10 MG tablet Take 1 tablet (10 mg total) by mouth 3 (three) times daily. 60 tablet 0 Past Week at Unknown time  . hydrOXYzine (ATARAX/VISTARIL) 25 MG tablet Take 25 mg by mouth 2 (two) times daily as needed for anxiety.   Past Week at Unknown time  . Ibuprofen 200 MG CAPS Take 400 mg by mouth every 8 (eight) hours as needed (for pain).    Unk at Unknown time  . meclizine (ANTIVERT) 25 MG tablet Take 1 tablet (25 mg total) by mouth 2 (two) times daily as needed for dizziness. 20 tablet 0   . ondansetron (ZOFRAN ODT) 8 MG disintegrating tablet Take 1 tablet (8 mg total)  by mouth every 8 (eight) hours as needed for nausea or vomiting. 10 tablet 0 unk at unk  . ondansetron (ZOFRAN) 4 MG tablet Take 1 tablet (4 mg total) by mouth every 6 (six) hours. 10 tablet 0   . traZODone (DESYREL) 100 MG tablet Take 250 mg by mouth at bedtime as needed for sleep.   unk at [employer]  . ziprasidone (GEODON) 20 MG capsule Take 20 mg by mouth 2 (two) times daily with a meal.    Past Week at Unknown time    Patient Stressors: Financial difficulties Medication change or noncompliance Substance abuse  Patient Strengths: Curator fund of knowledge Motivation for treatment/growth Supportive family/friends  Treatment Modalities: Medication Management, Group therapy, Case management,  1 to 1 session with clinician, Psychoeducation, Recreational therapy.   Physician Treatment Plan for Primary Diagnosis:MDD, recurrent, severe, with psychotic features Long Term Goal(s): Improvement in symptoms so as  ready for discharge Improvement in symptoms so as ready for discharge   Short Term Goals: Ability to identify changes in lifestyle to reduce recurrence of condition will improve Ability to demonstrate self-control will improve Ability to maintain clinical measurements within normal limits will improve Compliance with prescribed medications will improve Ability to verbalize feelings will improve Ability to demonstrate self-control will improve Ability to identify and develop effective coping behaviors will improve Ability to maintain clinical measurements within normal limits will improve Compliance with prescribed medications will improve Ability to identify triggers associated with substance abuse/mental health issues will improve  Medication Management: Evaluate patient's response, side effects, and tolerance of medication regimen.  Therapeutic Interventions: 1 to 1 sessions, Unit Group sessions and Medication administration.  Evaluation of Outcomes: Progressing  Physician Treatment Plan for Secondary Diagnosis: Active Problems:   Alcohol dependence with alcohol-induced mood disorder (HCC)   Severe recurrent major depression without psychotic features ([place])  Long Term Goal(s): Improvement in symptoms so as ready for discharge Improvement in symptoms so as ready for discharge   Short Term Goals: Ability to identify changes in lifestyle to reduce recurrence of condition will improve Ability to demonstrate self-control will improve Ability to maintain clinical measurements within normal limits will improve Compliance with prescribed medications will improve Ability to verbalize feelings will improve Ability to demonstrate self-control will improve Ability to identify and develop effective coping behaviors will improve Ability to maintain clinical measurements within normal limits will improve Compliance with prescribed medications will improve Ability to identify triggers  associated with substance abuse/mental health issues will improve     Medication Management: Evaluate patient's response, side effects, and tolerance of medication regimen.  Therapeutic Interventions: 1 to 1 sessions, Unit Group sessions and Medication administration.  Evaluation of Outcomes: Progressing   RN Treatment Plan for Primary Diagnosis:MDD, recurrent, severe, with psychotic features Long Term Goal(s): Knowledge of disease and therapeutic regimen to maintain health will improve  Short Term Goals: Ability to remain free from injury will improve, Ability to verbalize frustration and anger appropriately will improve, Ability to demonstrate self-control and Ability to disclose and discuss suicidal ideas  Medication Management: RN will administer medications as ordered by provider, will assess and evaluate patient's response and provide education to patient for prescribed medication. RN will report any adverse and/or side effects to prescribing provider.  Therapeutic Interventions: 1 on 1 counseling sessions, Psychoeducation, Medication administration, Evaluate responses to treatment, Monitor vital signs and CBGs as ordered, Perform/monitor CIWA, COWS, AIMS and Fall Risk screenings as ordered, Perform wound care treatments as ordered.  Evaluation  of Outcomes: Progressing   LCSW Treatment Plan for Primary Diagnosis: MDD, recurrent, severe, with psychotic features Long Term Goal(s): Safe transition to appropriate next level of care at discharge, Engage patient in therapeutic group addressing interpersonal concerns.  Short Term Goals: Engage patient in aftercare planning with referrals and resources, Facilitate patient progression through stages of change regarding substance use diagnoses and concerns and Identify triggers associated with mental health/substance abuse issues  Therapeutic Interventions: Assess for all discharge needs, 1 to 1 time with Social worker, Explore available  resources and support systems, Assess for adequacy in community support network, Educate family and significant other(s) on suicide prevention, Complete Psychosocial Assessment, Interpersonal group therapy.  Evaluation of Outcomes: Progressing   Progress in Treatment: Attending groups: Yes. Participating in groups: Yes. Taking medication as prescribed: Yes. Toleration medication: Yes. Family/Significant other contact made: SPE completed with pt; pt declined to consent to collateral contact.  Patient understands diagnosis: Yes. Discussing patient identified problems/goals with staff: Yes. Medical problems stabilized or resolved: Yes. Denies suicidal/homicidal ideation: Yes. Issues/concerns per patient self-inventory: No. Other: n/a   New problem(s) identified: No, Describe:  n/a  New Short Term/Long Term Goal(s): detox, medication management for mood stabilization; elimination of SI thoughts; development of comprehensive mental wellness/sobriety plan.   Patient Goals:  "I want to get linked with a therapist and get back on my medication."   Discharge Plan or Barriers: CSW assessing for appropriate referrals. [facility] appt needed. [place] pamphlet, Mobile Crisis information, and AA/NA information provided to patient for additional community support and resources.   Reason for Continuation of Hospitalization: Anxiety Depression Medication stabilization Suicidal ideation Withdrawal symptoms  Estimated Length of Stay: Wed, 08/02/2018  Attendees: Patient: [name]  07/31/2018 9:09 AM  Physician: Dr. Jake Samples MD 07/31/2018 9:09 AM  Nursing: Clarise Cruz RN; Alyssa RN 07/31/2018 9:09 AM  RN Care Manager:x 07/31/2018 9:09 AM  Social Worker: Janice Norrie LCSW 07/31/2018 9:09 AM  Recreational Therapist: x 07/31/2018 9:09 AM  Other: Lindell Spar NP; Harriett Sine NP 07/31/2018 9:09 AM  Other:  07/31/2018 9:09 AM  Other: 07/31/2018 9:09 AM    Scribe for Treatment Team: Avelina Laine,  LCSW 07/31/2018 9:09 AM

## 2018-07-31 NOTE — Progress Notes (Signed)
Recreation Therapy Notes  Date: 1.13.20 Time: 0930 Location: [address] Dayroom  Group Topic: Stress Management  Goal Area(s) Addresses:  Patient will identify stress management techniques. Patient will identify benefits of using stress management techniques post d/c.  Intervention: Stress Management  Activity :  Meditation.  LRT introduced the stress management technique of meditation.  LRT played a meditation on being resilient in the face of adversity.  Patients were to follow along as meditation played to engage in activity.  Education:  Stress Management, Discharge Planning.   Education Outcome: Acknowledges Education  Clinical Observations/Feedback: Pt did not attend group.    Victorino Sparrow, LRT/CTRS         Victorino Sparrow A 07/31/2018 11:50 AM

## 2018-07-31 NOTE — Progress Notes (Signed)
Patient ID: [name], male   DOB: 06/01/1981, 38 y.o.   MRN: [id number] D: Patient in room reading a book on approach. Pt stated he had a setback but is glad to be back on his medications. Pt denies any withdrawal  Symptoms.  Denies  SI/HI/AVH and pain.No behavioral issues noted.  A: Support and encouragement offered as needed to express needs. Medications administered as prescribed.  R: Patient is safe and cooperative on unit. Will continue to monitor  for safety and stability.

## 2018-07-31 NOTE — Progress Notes (Signed)
D:  [name] has been up and visible on the unit.  He was pleasant and cooperative.  He attended evening AA group.  He continues to voice passive SI and agrees to seek out staff if that changes.  He denied HI or A/V hallucinations.  He did report some withdrawal symptoms as shaking, sweating and anxiety.  PRN given with good relief.  He took his hs medications without difficulty.  He is currently resting with his eyes closed and appears to be asleep. A:  1:1 with RN for support and encouragement.  Medications as ordered.  Q 15 minute checks maintained for safety.  Encouraged participation in group and unit activities.   R:  [name] remains safe on the unit.  We will continue to monitor the progress towards his goals.

## 2018-08-01 LAB — HIV ANTIBODY (ROUTINE TESTING W REFLEX): HIV Screen 4th Generation wRfx: NONREACTIVE

## 2018-08-01 MED ORDER — METOPROLOL SUCCINATE ER 50 MG PO TB24
50.0000 mg | ORAL_TABLET | Freq: Every day | ORAL | Status: DC
  Administered 2018-08-01 – 2018-08-02 (×2): 50 mg via ORAL
  Filled 2018-08-01: qty 7
  Filled 2018-08-01 (×3): qty 1

## 2018-08-01 NOTE — Progress Notes (Signed)
Recreation Therapy Notes  Animal-Assisted Activity (AAA) Program Checklist/Progress Notes Patient Eligibility Criteria Checklist & Daily Group note for Rec Tx Intervention  Date: 1.14.20 Time: 56 Location: [address]   AAA/T Program Assumption of Risk Form signed by Teacher, music or Parent Legal Guardian  YES   Patient is free of allergies or sever asthma  YES   Patient reports no fear of animals  YES   Patient reports no history of cruelty to animals  YES   Patient understands his/her participation is voluntary  YES   Patient washes hands before animal contact  YES   Patient washes hands after animal contact  YES         Education: Contractor, Appropriate Animal Interaction   Education Outcome: Acknowledges understanding/In group clarification offered/Needs additional education.   Clinical Observations/Feedback: Pt did not attend group.    Victorino Sparrow, LRT/CTRS         Victorino Sparrow A 08/01/2018 3:40 PM

## 2018-08-01 NOTE — Plan of Care (Signed)
D: Patient presents calm and cooperative. He reports that his mood is good, and he is ready to be discharged tomorrow. He works night shift, and stayed up last night. He took frequent naps during the day today. He denies withdrawal symptoms or physical complaints. His anxiety is 4/10. Patient denies SI/HI/AVH.  A: Patient checked q15 min, and checks reviewed. Reviewed medication changes with patient and educated on side effects. Educated patient on importance of attending group therapy sessions and educated on several coping skills. Encouarged participation in milieu through recreation therapy and attending meals with peers. Support and encouragement provided. Fluids offered. R: Patient receptive to education on medications, and is medication compliant. Patient contracts for safety on the unit. Patient did not fill out a self-inventory.

## 2018-08-01 NOTE — Progress Notes (Signed)
[facility] MD Progress Note  08/01/2018 9:48 AM [name]  MRN:  [id number] Subjective:    Patient seen in his room he is alert and oriented and generally cooperative without thoughts of harming self or others and contracting today hopes to have housing tomorrow no withdrawal symptoms No involuntary movements, plan for discharge tomorrow Principal Problem: Since abuse/homelessness/depression Diagnosis: Active Problems:   Alcohol dependence with alcohol-induced mood disorder (HCC)   Severe recurrent major depression without psychotic features ([place])  Total Time spent with patient: 20 minutes Past Medical History:  Past Medical History:  Diagnosis Date  . Angina   . Anxiety    panic attack  . Bipolar 1 disorder ([place])   . Breast CA ([place]) dx'd 2009   bil w/ bil masectomy and oral meds  . Cancer [facility])    kidney cancer  . Coronary artery disease   . Depression   . H/O suicide attempt 2015   overdose  . Headache(784.0)   . Hypercholesteremia   . Hypertension   . Liver cirrhosis ([place])   . Pancreatitis   . Peripheral vascular disease [facility]) April 2011   Left Pop  . Schizophrenia ([place])   . Seizures ([place])    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  . Shortness of breath     Past Surgical History:  Procedure Laterality Date  . BREAST SURGERY    . BREAST SURGERY     bilateral breast silocone  removal  . CHEST SURGERY    . left kidney removal    . left leg surgery     "popiteal artery clogged"  . MASTECTOMY Bilateral   . NEPHRECTOMY Left   . ORIF CLAVICULAR FRACTURE Left 08/10/2017   Procedure: OPEN REDUCTION INTERNAL FIXATION (ORIF) LEFT CLAVICLE FRACTURE WITH RECONSTRUCTION OF CORACOCLAVICULAR LIGAMENT;  Surgeon: Leandrew Koyanagi, MD;  Location: [facility];  Service: Orthopedics;  Laterality: Left;  . RECONSTRUCTION OF CORACOCLAVICULAR LIGAMENT Left 08/10/2017   Procedure: RECONSTRUCTION OF CORACOCLAVICULAR LIGAMENT;  Surgeon: Leandrew Koyanagi, MD;  Location: [place];  Service: Orthopedics;  Laterality:  Left;   Family History:  Family History  Problem Relation Age of Onset  . Stroke Other   . Cancer Other   . Hyperlipidemia Mother   . Hypertension Mother     Social History:  Social History   Substance and Sexual Activity  Alcohol Use Yes   Comment: 5 bottle of wine daily, last consumption 1 hr PTA     Social History   Substance and Sexual Activity  Drug Use No  . Frequency: 1.0 times per week  . Types: "Crack" cocaine, Cocaine   Comment: clean 3 weeks    Social History   Socioeconomic History  . Marital status: Single    Spouse name: Not on file  . Number of children: Not on file  . Years of education: Not on file  . Highest education level: Not on file  Occupational History  . Not on file  Social Needs  . Financial resource strain: Not on file  . Food insecurity:    Worry: Not on file    Inability: Not on file  . Transportation needs:    Medical: Not on file    Non-medical: Not on file  Tobacco Use  . Smoking status: Former Research scientist (life sciences)  . Smokeless tobacco: Never Used  . Tobacco comment: smoked on and off 16 years- quit 2016  Substance and Sexual Activity  . Alcohol use: Yes    Comment: 5 bottle of wine  daily, last consumption 1 hr PTA  . Drug use: No    Frequency: 1.0 times per week    Types: "Crack" cocaine, Cocaine    Comment: clean 3 weeks  . Sexual activity: Yes    Birth control/protection: Condom    Comment: anal  Lifestyle  . Physical activity:    Days per week: Not on file    Minutes per session: Not on file  . Stress: Not on file  Relationships  . Social connections:    Talks on phone: Not on file    Gets together: Not on file    Attends religious service: Not on file    Active member of club or organization: Not on file    Attends meetings of clubs or organizations: Not on file    Relationship status: Not on file  Other Topics Concern  . Not on file  Social History Narrative   ** Merged History Encounter **       ** Merged History  Encounter **       Additional Social History:                         Sleep: Good  Appetite:  Good  Current Medications: Current Facility-Administered Medications  Medication Dose Route Frequency Provider Last Rate Last Dose  . alum & mag hydroxide-simeth (MAALOX/MYLANTA) 200-200-20 MG/5ML suspension 30 mL  30 mL Oral Q4H PRN Lindon Romp A, NP      . amLODipine (NORVASC) tablet 10 mg  10 mg Oral Daily [name], MD   10 mg at 08/01/18 4403  . benzocaine (ORAJEL) 10 % mucosal gel   Mouth/Throat PRN Lindon Romp A, NP      . busPIRone (BUSPAR) tablet 10 mg  10 mg Oral TID [name], NP   10 mg at 08/01/18 4742  . hydrOXYzine (ATARAX/VISTARIL) tablet 25 mg  25 mg Oral TID [name], MD   25 mg at 08/01/18 5956  . ibuprofen (ADVIL,MOTRIN) tablet 600 mg  600 mg Oral Q6H PRN Lindon Romp A, NP      . lamoTRIgine (LAMICTAL) tablet 50 mg  50 mg Oral BID [name], MD   50 mg at 08/01/18 3875  . loperamide (IMODIUM) capsule 2-4 mg  2-4 mg Oral PRN Lindon Romp A, NP      . LORazepam (ATIVAN) tablet 0.5 mg  0.5 mg Oral BID Lindon Romp A, NP   0.5 mg at 08/01/18 0828   Followed by  . [START ON 08/02/2018] LORazepam (ATIVAN) tablet 1 mg  1 mg Oral Daily Lindon Romp A, NP      . LORazepam (ATIVAN) tablet 1 mg  1 mg Oral Q6H PRN Lindon Romp A, NP   1 mg at 07/30/18 2134  . magnesium hydroxide (MILK OF MAGNESIA) suspension 30 mL  30 mL Oral Daily PRN Lindon Romp A, NP      . metoprolol succinate (TOPROL-XL) 24 hr tablet 50 mg  50 mg Oral Daily [name], MD      . multivitamin with minerals tablet 1 tablet  1 tablet Oral Daily Lindon Romp A, NP   1 tablet at 08/01/18 0828  . thiamine (VITAMIN B-1) tablet 100 mg  100 mg Oral Daily Lindon Romp A, NP   100 mg at 08/01/18 0829  . traZODone (DESYREL) tablet 250 mg  250 mg Oral QHS PRN [name], MD   250 mg at 07/31/18 2126  Lab Results:  Results for orders placed or performed during the hospital encounter of  07/29/18 (from the past 48 hour(s))  HIV Antibody (routine testing w rflx)     Status: None   Collection Time: 07/31/18  6:35 AM  Result Value Ref Range   HIV Screen 4th Generation wRfx Non Reactive Non Reactive    Comment: (NOTE) Performed At: [facility] [place], [place] [id number] Rush Farmer MD QP:[id number]     Blood Alcohol level:  Lab Results  Component Value Date   [facility] <10 06/06/2018   ETH <10 99/35/7017    Metabolic Disorder Labs: Lab Results  Component Value Date   HGBA1C 4.9 07/30/2018   MPG 93.93 07/30/2018   MPG 102.54 07/29/2017   No results found for: PROLACTIN Lab Results  Component Value Date   CHOL 371 (H) 07/30/2018   TRIG 70 07/30/2018   HDL 114 07/30/2018   CHOLHDL 3.3 07/30/2018   VLDL 14 07/30/2018   LDLCALC 243 (H) 07/30/2018   LDLCALC 135 (H) 07/29/2017    Physical Findings: AIMS: Facial and Oral Movements Muscles of Facial Expression: None, normal Lips and Perioral Area: None, normal Jaw: None, normal Tongue: None, normal,Extremity Movements Upper (arms, wrists, hands, fingers): None, normal Lower (legs, knees, ankles, toes): None, normal, Trunk Movements Neck, shoulders, hips: None, normal, Overall Severity Severity of abnormal movements (highest score from questions above): None, normal Incapacitation due to abnormal movements: None, normal [name] awareness of abnormal movements (rate only patient's report): No Awareness, Dental Status Current problems with teeth and/or dentures?: No Does patient usually wear dentures?: No  CIWA:  CIWA-Ar Total: 1 COWS:  COWS Total Score: 1  Musculoskeletal: Strength & Muscle Tone: within normal limits Gait & Station: normal Patient leans: N/A  Psychiatric Specialty Exam: Physical Exam  ROS  Blood pressure (!) 134/96, pulse (!) 117, temperature 98 F (36.7 C), temperature source Oral, resp. rate 16, height 5\' 6"  (1.676 m), weight 69.4 kg.Body mass index is 24.69  kg/m.  General Appearance: Casual  Eye Contact:  Good  Speech:  Clear and Coherent  Volume:  Normal  Mood:  Dysphoric  Affect:  Congruent  Thought Process:  Coherent  Orientation:  Full (Time, Place, and Person)  Thought Content:  Tangential  Suicidal Thoughts:  No  Homicidal Thoughts:  No  Memory:  Immediate;   Good  Judgement:  Good  Insight:  Good  Psychomotor Activity:  Psychomotor Retardation  Concentration:  Concentration: Good  Recall:  Good  Fund of Knowledge:  Good  Language:  Good  Akathisia:  Negative  Handed:  Right  AIMS (if indicated):     Assets:  Communication Skills Desire for Improvement  ADL's:  Intact  Cognition:  WNL  Sleep:  Number of Hours: 2.75     Treatment Plan Summary: Daily contact with patient to assess and evaluate symptoms and progress in treatment, Medication management and Plan To new current meds and precautions, no change in meds, probable discharge tomorrow  [name], MD 08/01/2018, 9:48 AM

## 2018-08-01 NOTE — Progress Notes (Addendum)
Pt observe in the room, seen resting in bed with eyes open. Pt did not attend AA meeting. Pt appears depressed/anxious/isolative in affect and mood. Pt c/o of unresolved itching. Pt states he hopes to be d/c tomorrow. Pt states he will move out of parents house and into his new apartment. Pleasant and quiet in the milieu. Support offered. Will continue with POC.

## 2018-08-01 NOTE — BHH Group Notes (Signed)
[facility] Group Therapy 08/01/2018 1:15pm  Type of Therapy: Mental Health Association Presentation  Participation Level: Pt invited. Chose to remain in bed.   Avelina Laine, LCSW 08/01/2018 1:43 PM

## 2018-08-02 DIAGNOSIS — F1920 Other psychoactive substance dependence, uncomplicated: Secondary | ICD-10-CM

## 2018-08-02 DIAGNOSIS — F1120 Opioid dependence, uncomplicated: Secondary | ICD-10-CM

## 2018-08-02 MED ORDER — LAMOTRIGINE 25 MG PO TABS: 50.0000 mg | ORAL_TABLET | Freq: Two times a day (BID) | ORAL | 0 refills | Status: DC

## 2018-08-02 MED ORDER — BUSPIRONE HCL 10 MG PO TABS: 10.0000 mg | ORAL_TABLET | Freq: Three times a day (TID) | ORAL | 0 refills | Status: DC

## 2018-08-02 MED ORDER — BENZOCAINE 10 % MT GEL: OROMUCOSAL | 0 refills | Status: DC | PRN

## 2018-08-02 MED ORDER — AMLODIPINE BESYLATE 10 MG PO TABS: 10.0000 mg | ORAL_TABLET | Freq: Every day | ORAL | 0 refills | Status: DC

## 2018-08-02 MED ORDER — HYDROXYZINE HCL 25 MG PO TABS: 25.0000 mg | ORAL_TABLET | Freq: Three times a day (TID) | ORAL | 0 refills | Status: DC

## 2018-08-02 MED ORDER — IBUPROFEN 600 MG PO TABS: 600.0000 mg | ORAL_TABLET | Freq: Four times a day (QID) | ORAL | 0 refills | Status: DC | PRN

## 2018-08-02 MED ORDER — METOPROLOL SUCCINATE ER 50 MG PO TB24: 50.0000 mg | ORAL_TABLET | Freq: Every day | ORAL | 0 refills | Status: DC

## 2018-08-02 MED ORDER — TRAZODONE HCL 100 MG PO TABS: 250.0000 mg | ORAL_TABLET | Freq: Every evening | ORAL | 0 refills | Status: DC | PRN

## 2018-08-02 NOTE — Progress Notes (Signed)
Recreation Therapy Notes  Date: 1.15. 20 Time: 0930 Location: [address] Dayroom  Group Topic: Stress Management  Goal Area(s) Addresses:  Patient will identify stress management techniques. Patient will identify benefits of using stress management post d/c.  Intervention: Stress Management  Activity :  Guided Imagery.  LRT introduced the stress management technique of guided imagery.  LRT read a script that lead the patients on a mental vacation to the beach at sunrise.  Patients were to listen and follow along as LRT read script.  Education:  Stress Management, Discharge Planning.   Education Outcome: Acknowledges Education  Clinical Observations/Feedback:  Pt did not attend group.     Victorino Sparrow, LRT/CTRS         Ria Comment, S[place]y Rolison A 08/02/2018 11:01 AM

## 2018-08-02 NOTE — Progress Notes (Signed)
Patient ID: [name], male   DOB: 10/22/1980, 38 y.o.   MRN: [id number]  Pt currently presents with a pleasant affect and cooperative behavior. Pt reports to writer that their goal is to "go home." Pt plans to live on his own and move out of his parents house. Pt reports good sleep with current medication regimen.   Pt provided with medications per providers orders. Pt's labs and vitals were monitored throughout the night. Pt supported emotionally and encouraged to express concerns and questions. Pt educated on medications and suicide prevention precautions.  Pt's safety ensured with 15 minute and environmental checks. Pt currently denies SI/HI and A/V hallucinations. Pt verbally agrees to seek staff if SI/HI or A/VH occurs and to consult with staff before acting on any harmful thoughts. Will continue POC.

## 2018-08-02 NOTE — BHH Suicide Risk Assessment (Signed)
[facility] Discharge Suicide Risk Assessment   Principal Problem: Alcohol dependence/depression/borderline disorder Discharge Diagnoses: Active Problems:   Alcohol dependence with alcohol-induced mood disorder (HCC)   Severe recurrent major depression without psychotic features ([place])   Total Time spent with patient: 30 minutes   Alert and oriented without thoughts of self-harm generally stable in mood and affect and ready for discharge Mental Status Per Nursing Assessment::   On Admission:  Suicidal ideation indicated by patient, Suicide plan  Demographic Factors:  Male  Loss Factors: Decrease in vocational status  Historical Factors: Impulsivity  Risk Reduction Factors:   Religious beliefs about death  Continued Clinical Symptoms:  Personality Disorders:   Cluster B  Cognitive Features That Contribute To Risk:  None    Suicide Risk:  Minimal: No identifiable suicidal ideation.  Patients presenting with no risk factors but with morbid ruminations; may be classified as minimal risk based on the severity of the depressive symptoms  Follow-up Information    [facility]. Go on 08/10/2018.   Why:  Hospital follow up appointment 1/23 at 3:40p. Please bring photo ID, proof of insurance, SSN and discharge paperwork from this hospitalization.  Contact information: [address] [place] [address] [phone]           Plan Of Care/Follow-up recommendations:  Activity:  full  Malayiah Mcbrayer, MD 08/02/2018, 9:17 AM

## 2018-08-02 NOTE — Discharge Summary (Signed)
Physician Discharge Summary Note  Patient:  [name] is an 38 y.o., male  MRN:  [id number]  DOB:  October 07, 1980  Patient phone:  [phone] (home)   Patient address:   [address] [place] [address],   Total Time spent with patient: Greater than 30 minutes  Date of Admission:  07/29/2018  Date of Discharge: 08/02/18  Reason for Admission: Worsening symptoms of depression with suicidal ideations    Principal Problem: Polysubstance dependence including opioid type drug, continuous use [facility])  Discharge Diagnoses: Patient Active Problem List   Diagnosis Date Noted  . Polysubstance dependence including opioid type drug, continuous use ([place]) [F11.20, F19.20] 08/02/2018    Priority: High  . Episode of recurrent major depressive disorder ([place]) [F33.9] 06/29/2012    Priority: High    Class: Acute  . Severe recurrent major depression without psychotic features ([place]) [F33.2] 07/29/2018    Priority: Medium  . Chronic left shoulder pain [M25.512, G89.29] 01/11/2018  . Pain of left clavicle [M89.8X1] 01/11/2018  . Alcohol withdrawal seizure ([place]) [T26.712, R56.9] 12/31/2017  . Abdominal pain [R10.9] 12/31/2017  . Alcohol withdrawal seizure without complication ([place]) [W58.099]   . Melena [K92.1] 12/12/2017  . Emesis [R11.10] 10/20/2017  . Displaced fracture of lateral end of left clavicle, initial encounter for closed fracture [S42.032A] 08/05/2017  . Coracoclavicular (ligament) sprain and strain, left, initial encounter [S43.82XA] 08/05/2017  . Closed dislocation of acromioclavicular joint, initial encounter [I33.825K] 08/05/2017  . Insomnia [G47.00]   . Hypertension [I10]   . Severe episode of recurrent major depressive disorder, without psychotic features ([place]) [F33.2]   . Depression, major, severe recurrence ([place]) [F33.2] 12/30/2015  . Substance induced mood disorder ([place]) [F19.94] 12/02/2015  . Mood disorder in conditions classified elsewhere [F06.30]   . Malnutrition of  moderate degree [E44.0] 09/24/2015  . Alcohol withdrawal ([place]) [F10.239] 09/23/2015  . Tobacco use disorder [F17.200] 07/16/2015  . Elevated transaminase level [R74.0]   . Drug overdose, intentional ([place]) [T50.902A] 07/12/2015  . Alcohol dependence with alcohol-induced mood disorder ([place]) [F10.24] 04/15/2015  . Cocaine abuse with cocaine-induced mood disorder ([place]) [F14.14] 04/11/2015  . Overdose [T50.901A] 04/10/2015  . Severe recurrent major depressive disorder with psychotic features ([place]) [F33.3]   . Severe major depression with psychotic features ([place]) [F32.3] 09/11/2014  . Alcohol-induced mood disorder ([place]) [F10.94] 09/10/2014  . Suicidal ideation [R45.851]   . Tylenol overdose [T39.1X1A]   . Polysubstance abuse ([place]) [F19.10]   . Overdose of acetaminophen [T39.1X1A] 08/03/2014  . Overdose by acetaminophen [T39.1X1A] 08/03/2014  . Cocaine abuse ([place]) [F14.10] 04/16/2014  . Thrombocytopenia ([place]) [D69.6] 04/15/2014  . Urinary tract infection, site not specified [N39.0] 04/15/2014  . Chest pain, unspecified [R07.9] 04/15/2014  . D-dimer, elevated [R79.89] 04/15/2014  . Transaminitis [R74.0] 09/24/2013  . Cocaine dependence ([place]) [F14.20] 09/20/2013  . 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 Seizure ([place]) [R56.9] 03/15/2013  . Syncope [R55] 01/02/2013  . Leukocytopenia, unspecified [D72.819] 01/02/2013  . Left kidney mass [N28.89] 12/24/2012  . PTSD (post-traumatic stress disorder) [F43.10] 07/06/2012  . Peripheral vascular disease ([place]) [I73.9] 01/14/2012  . Alcohol abuse [F10.10] 10/13/2011  . SEIZURE DISORDER [R56.9] 10/03/2008  . Elevated LFTs [R94.5] 12/29/2007  . HYPERCHOLESTEROLEMIA [E78.00] 03/21/2007  . Essential hypertension [I10] 03/21/2007   Past Psychiatric History: Bipolar Affective Disorder, Major depression, PTSD, Schizoaffective disorder.  Current Outpatient provider: [facility] psychiatrist & therapist.   Inpatient hospitalizations: [place] x numerous, [facility] x 2, [facility] x 3.   Past Medical History:  Past Medical History:  Diagnosis Date  . Angina   . Anxiety    panic attack  . Bipolar 1 disorder ([place])   . Breast CA ([place]) dx'd 2009   bil w/ bil masectomy and oral meds  . Cancer [facility])    kidney cancer  . Coronary artery disease   . Depression   . H/O suicide attempt 2015   overdose  . Headache(784.0)   . Hypercholesteremia   . Hypertension   . Liver cirrhosis ([place])   . Pancreatitis   . Peripheral vascular disease [facility]) April 2011   Left Pop  . Schizophrenia ([place])   . Seizures ([place])    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  . Shortness of breath     Past Surgical History:  Procedure Laterality Date  . BREAST SURGERY    . BREAST SURGERY     bilateral breast silocone  removal  . CHEST SURGERY    . left kidney removal    . left leg surgery     "popiteal artery clogged"  . MASTECTOMY Bilateral   . NEPHRECTOMY Left   . ORIF CLAVICULAR FRACTURE Left 08/10/2017   Procedure: OPEN REDUCTION INTERNAL FIXATION (ORIF) LEFT CLAVICLE FRACTURE WITH RECONSTRUCTION OF CORACOCLAVICULAR LIGAMENT;  Surgeon: Leandrew Koyanagi, MD;  Location: [place];  Service: Orthopedics;  Laterality: Left;  . RECONSTRUCTION OF CORACOCLAVICULAR LIGAMENT Left 08/10/2017   Procedure: RECONSTRUCTION OF CORACOCLAVICULAR LIGAMENT;  Surgeon: Leandrew Koyanagi, MD;  Location: [place];  Service: Orthopedics;  Laterality: Left;   Family History:  Family History  Problem Relation Age of Onset  . Stroke Other   . Cancer Other   . Hyperlipidemia Mother   . Hypertension Mother    Family Psychiatric  History: See H&P  Social History:  Social History   Substance and Sexual Activity  Alcohol Use Yes   Comment: 5 bottle of wine daily, last consumption 1 hr PTA     Social History   Substance and Sexual Activity  Drug Use No  . Frequency: 1.0 times per week  . Types: "Crack" cocaine, Cocaine   Comment: clean 3 weeks    Social History   Socioeconomic History  .  Marital status: Single    Spouse name: Not on file  . Number of children: Not on file  . Years of education: Not on file  . Highest education level: Not on file  Occupational History  . Not on file  Social Needs  . Financial resource strain: Not on file  . Food insecurity:    Worry: Not on file    Inability: Not on file  . Transportation needs:    Medical: Not on file    Non-medical: Not on file  Tobacco Use  . Smoking status: Former Research scientist (life sciences)  . Smokeless tobacco: Never Used  . Tobacco comment: smoked on and off 16 years- quit 2016  Substance and Sexual Activity  . Alcohol use: Yes    Comment: 5 bottle of wine daily, last consumption 1 hr PTA  . Drug use: No    Frequency: 1.0 times per week    Types: "Crack" cocaine, Cocaine    Comment: clean 3 weeks  . Sexual activity: Yes    Birth control/protection: Condom    Comment: anal  Lifestyle  . Physical activity:    Days per week: Not on file    Minutes per session: Not on file  . Stress: Not on file  Relationships  . Social connections:    Talks on phone: Not  on file    Gets together: Not on file    Attends religious service: Not on file    Active member of club or organization: Not on file    Attends meetings of clubs or organizations: Not on file    Relationship status: Not on file  Other Topics Concern  . Not on file  Social History Narrative   ** Merged History Encounter **       ** Merged History Encounter **       Hospital Course: (Per Md's admission SRA): 38 year old AA male, single, currently living alone, reports he recently lost his job and is currently financially dependent on his family. He presented to hospital voluntarily for worsening depression, with suicidal ideations. He also reported headache, dizziness, which he attributes to history of vertigo. States these symptoms are better today. He reports he had been sober x 7 months, relapsed about three weeks ago. (Alcohol, Cocaine). States he initially  started drinking 2 glasses of wine, but states it has progressed to heavy, daily drinking. He has been off his psychiatric medications x 2 weeks due to difficulty affording refills. He had been prescribed Geodon, Buspar, Buspar, Norvasc for HTN, Antivert PRN for dizziness. States medications were well tolerated and helpful. Patient has history of prior psychiatric admissions and has been diagnosed with  MDD in the past. History of suicide attempt by overdose on medications. Reports past history of hallucinations related to episodes of alcohol use or withdrawal, but otherwise denies history of psychosis. He is known to [facility] from prior psychiatric admissions , last time January 2019 at which time was admitted for depression, suicidal ideations, relapse. At the time, was discharged on Geodon, Trazodone, Depakote. Reports history of Breast Surgery and Nephrectomy for Malignancies. Currently in remission. History of seizures, which he states he has been told are alcohol withdrawal related but states he had a seizure in the past about 2 months into abstinence. States he is prescribed Lamictal for seizures (at 25 mgrs BID?) , which he states he took up to a week ago. 10/12 QTc 495.  This is one of several discharge summaries from this [facility] hospital alone for [facility]. He is known on this unit from previous hospitalizations all related to mood instability & substance abuse issues. He was last treated & stabilized in this hospital the January of 2019. He was then discharged with an outpatient psychiatric treatment recommendations for medication management, counseling & substance abuse treatment. He returned to [facility] this time around complaining of worsening symptoms of depression with suicidal ideations. Abyan has hx of multiple suicide attempts by overdose. He stated on admission that he was out of mental health medications x 2 weeks. Said did not have the funds to pay for refills.  After evaluation of his presenting  symptoms, Gaetano was recommended for mood stabilization treatments. The medication regimen targeting those symptoms were discussed & initiated with his consent. His mood instability was stabilized using the medications as listed below.  Besides the mood stabilization treatments, Rueben presented other significant pre-existing medical issues that required treatment & or monitoring. He was resumed & discharged on those pertinent home medications for those symptoms. Demont's symptoms responded well to his treatment regimen. This is evidenced by his reports of improved mood, absence of suicidal ideations, presentation of good affect & eye contacts. He is currently mentally & medically stable to be discharged to continue mental health care on an outpatient basis as noted below. He was provided with all the  necessary information needed to make this appointment without problems.  Upon discharge, Carliss adamantly denies any SIHI, AVH, delusional thoughts or paranoia. He received from the [facility] pharmcy, a 7 days worth, supply samples of his [facility] discharge medications. He was able to engage in safety planning including plan to return to [facility] or contact emergency services if he feels unable to maintain his own safety or the safety of others. Pt had no further questions, comments or concerns. He left [facility] with all personal belongings in no apparent distress.  Physical Findings: AIMS: Facial and Oral Movements Muscles of Facial Expression: None, normal Lips and Perioral Area: None, normal Jaw: None, normal Tongue: None, normal,Extremity Movements Upper (arms, wrists, hands, fingers): None, normal Lower (legs, knees, ankles, toes): None, normal, Trunk Movements Neck, shoulders, hips: None, normal, Overall Severity Severity of abnormal movements (highest score from questions above): None, normal Incapacitation due to abnormal movements: None, normal [name] awareness of abnormal movements (rate only patient's  report): No Awareness, Dental Status Current problems with teeth and/or dentures?: No Does patient usually wear dentures?: No  CIWA:  CIWA-Ar Total: 0 COWS:  COWS Total Score: 1  Musculoskeletal: Strength & Muscle Tone: within normal limits Gait & Station: normal Patient leans: N/A  Psychiatric Specialty Exam: Physical Exam  Nursing note and vitals reviewed. Constitutional: He is oriented to person, place, and time. He appears well-developed and well-nourished.  HENT:  Head: Normocephalic.  Eyes: Pupils are equal, round, and reactive to light.  Cardiovascular: Normal rate.  Respiratory: Effort normal.  GI: Soft.  Genitourinary:    Genitourinary Comments: Deferred   Musculoskeletal: Normal range of motion.  Neurological: He is alert and oriented to person, place, and time.  Skin: Skin is warm.    Review of Systems  Constitutional: Negative.   HENT: Negative.   Eyes: Negative.   Respiratory: Negative.  Negative for cough and shortness of breath.   Cardiovascular: Negative.  Negative for chest pain and palpitations.  Gastrointestinal: Negative.  Negative for abdominal pain, heartburn, nausea and vomiting.  Genitourinary: Negative.   Musculoskeletal: Negative.   Skin: Negative.   Neurological: Negative.  Negative for dizziness and headaches.  Endo/Heme/Allergies: Negative.   Psychiatric/Behavioral: Positive for depression (Stabilized with medication prior to discharge) and substance abuse (Hx. Alcohol, benzodiazepine & Cocaine use disorders). Negative for hallucinations, memory loss and suicidal ideas. The patient has insomnia (Stable). The patient is not nervous/anxious (Stable).     Blood pressure 111/86, pulse 85, temperature 98.2 F (36.8 C), temperature source Oral, resp. rate 16, height 5\' 6"  (1.676 m), weight 69.4 kg.Body mass index is 24.69 kg/m.  See Md's discharge SRA   Has this patient used any form of tobacco in the last 30 days? (Cigarettes, Smokeless Tobacco,  Cigars, and/or Pipes): No  Blood Alcohol level:  Lab Results  Component Value Date   ETH <10 06/06/2018   ETH <10 19/14/7829   Metabolic Disorder Labs:  Lab Results  Component Value Date   HGBA1C 4.9 07/30/2018   MPG 93.93 07/30/2018   MPG 102.54 07/29/2017   No results found for: PROLACTIN Lab Results  Component Value Date   CHOL 371 (H) 07/30/2018   TRIG 70 07/30/2018   HDL 114 07/30/2018   CHOLHDL 3.3 07/30/2018   VLDL 14 07/30/2018   LDLCALC 243 (H) 07/30/2018   LDLCALC 135 (H) 07/29/2017   See Psychiatric Specialty Exam and Suicide Risk Assessment completed by Attending Physician prior to discharge.  Discharge destination:  Home  Is patient on  multiple antipsychotic therapies at discharge:  No   Has Patient had three or more failed trials of antipsychotic monotherapy by history:  No  Recommended Plan for Multiple Antipsychotic Therapies: NA Discharge Instructions    Discharge instructions   Complete by:  As directed    Patient is instructed prior to discharge to: Take all medications as prescribed by his/her mental healthcare provider. Report any adverse effects and or reactions from the medicines to his/her outpatient provider promptly. Patient has been instructed & cautioned: To not engage in alcohol and or illegal drug use while on prescription medicines. In the event of worsening symptoms, patient is instructed to call the crisis hotline, 911 and or go to the nearest ED for appropriate evaluation and treatment of symptoms. To follow-up with his/her primary care provider for your other medical issues, concerns and or health care needs.     Allergies as of 08/02/2018      Reactions   Codeine Hives, Itching, Swelling, Other (See Comments)   Does not impair breathing, however   Penicillins Swelling, Other (See Comments)   Has patient had a PCN reaction causing immediate rash, facial/tongue/throat swelling, SOB or lightheadedness with hypotension: Yes Has patient  had a PCN reaction causing severe rash involving mucus membranes or skin necrosis: Yes Has patient had a PCN reaction that required hospitalization Yes-ed visit Has patient had a PCN reaction occurring within the last 10 years: Yes If all of the above answers are "NO", then may proceed with Cephalosporin use.   Morphine Itching   Coconut Flavor Itching, Swelling   Coconut Oil Itching, Swelling, Other (See Comments)   Cannot take with some of his meds (also)   Grapefruit Concentrate Other (See Comments)   Cannot take with some of his meds   Morphine And Related Itching, Swelling   Oxycodone Itching, Swelling   Norco [hydrocodone-acetaminophen] Itching, Rash      Medication List    STOP taking these medications   Ibuprofen 200 MG Caps Replaced by:  ibuprofen 600 MG tablet   meclizine 25 MG tablet Commonly known as:  ANTIVERT   ondansetron 4 MG tablet Commonly known as:  ZOFRAN   ondansetron 8 MG disintegrating tablet Commonly known as:  ZOFRAN ODT   ziprasidone 20 MG capsule Commonly known as:  GEODON     TAKE these medications     Indication  amLODipine 10 MG tablet Commonly known as:  NORVASC Take 1 tablet (10 mg total) by mouth daily. For high blood pressure Start taking on:  August 03, 2018 What changed:    medication strength  how much to take  additional instructions  Indication:  High Blood Pressure Disorder   benzocaine 10 % mucosal gel Commonly known as:  ORAJEL Use as directed in the mouth or throat as needed for mouth pain.  Indication:  Tooth pain   busPIRone 10 MG tablet Commonly known as:  BUSPAR Take 1 tablet (10 mg total) by mouth 3 (three) times daily. For anxiety What changed:  additional instructions  Indication:  Anxiety Disorder   hydrOXYzine 25 MG tablet Commonly known as:  ATARAX/VISTARIL Take 1 tablet (25 mg total) by mouth 3 (three) times daily. For anxiety What changed:    when to take this  reasons to take this  additional  instructions  Indication:  Feeling Anxious   ibuprofen 600 MG tablet Commonly known as:  ADVIL,MOTRIN Take 1 tablet (600 mg total) by mouth every 6 (six) hours as needed for moderate pain. (  May buy from over the counter): For mild to moderate pain Replaces:  Ibuprofen 200 MG Caps  Indication:  Mild to moderate pain   lamoTRIgine 25 MG tablet Commonly known as:  LAMICTAL Take 2 tablets (50 mg total) by mouth 2 (two) times daily. For mood stabilization  Indication:  Mood stabilization   metoprolol succinate 50 MG 24 hr tablet Commonly known as:  TOPROL-XL Take 1 tablet (50 mg total) by mouth daily. Take with or immediately following a meal: For high blood pressure Start taking on:  August 03, 2018  Indication:  High Blood Pressure Disorder   traZODone 100 MG tablet Commonly known as:  DESYREL Take 2.5 tablets (250 mg total) by mouth at bedtime as needed for sleep.  Indication:  Trouble Sleeping      Follow-up [facility]. Go on 08/10/2018.   Why:  Hospital follow up appointment 1/23 at 3:40p. Please bring photo ID, proof of insurance, SSN and discharge paperwork from this hospitalization.  Contact information: [address] [place] [address] [phone]          Follow-up recommendations: Activity:  As tolerated Diet: As recommended by your primary care doctor. Keep all scheduled follow-up appointments as recommended.  Comments:  Patient is instructed prior to discharge to: Take all medications as prescribed by his/her mental healthcare provider. Report any adverse effects and or reactions from the medicines to his/her outpatient provider promptly. Patient has been instructed & cautioned: To not engage in alcohol and or illegal drug use while on prescription medicines. In the event of worsening symptoms, patient is instructed to call the crisis hotline, 911 and or go to the nearest ED for appropriate evaluation and treatment of symptoms. To follow-up with  his/her primary care provider for your other medical issues, concerns and or health care needs.   Signed: Lindell Spar, NP, PMHNP, FNP-BC 08/02/2018, 10:12 AM

## 2018-08-02 NOTE — Progress Notes (Signed)
Psychoeducational Group Note  Date:  08/02/2018 Time:  2427  Group Topic/Focus:  Wrap-Up Group:   The focus of this group is to help patients review their daily goal of treatment and discuss progress on daily workbooks.  Participation Level: Did Not Attend  Participation Quality:  Not Applicable  Affect:  Not Applicable  Cognitive:  Not Applicable  Insight:  Not Applicable  Engagement in Group: Not Applicable  Additional Comments:  The patient did not attend group last evening since he was not feeling well.   Archie Balboa S 08/02/2018, 12:27 AM

## 2018-08-02 NOTE — Progress Notes (Signed)
  [facility] Adult Case Management Discharge Plan :  Will you be returning to the same living situation after discharge:  Yes,  home At discharge, do you have transportation home?: Yes,  bus Do you have the ability to pay for your medications: Yes,  mental health  Release of information consent forms completed and submitted to medical records by CSW.  Patient to Follow up at: Follow-up Information    [facility]. Go on 08/10/2018.   Why:  Hospital follow up appointment 1/23 at 3:40p. Please bring photo ID, proof of insurance, SSN and discharge paperwork from this hospitalization.  Contact information: [place] [address] [phone]           Next level of care provider has access to [place] and Suicide Prevention discussed: Yes,  SPE completed with pt; pt declined to consent to collateral contact. SPI pamphlet and mobile crisis information provided.    Has patient been referred to the Quitline?: Patient refused referral  Patient has been referred for addiction treatment: Yes  Avelina Laine, LCSW 08/02/2018, 11:32 AM

## 2018-08-29 ENCOUNTER — Other Ambulatory Visit: Payer: Self-pay

## 2018-08-29 ENCOUNTER — Emergency Department (HOSPITAL_COMMUNITY)
Admission: EM | Admit: 2018-08-29 | Discharge: 2018-08-29 | Disposition: A | Discharge status: Home / Self Care | Payer: Self-pay | Attending: Emergency Medicine | Admitting: Emergency Medicine

## 2018-08-29 DIAGNOSIS — R112 Nausea with vomiting, unspecified: Secondary | ICD-10-CM | POA: Insufficient documentation

## 2018-08-29 DIAGNOSIS — Z85528 Personal history of other malignant neoplasm of kidney: Secondary | ICD-10-CM | POA: Insufficient documentation

## 2018-08-29 DIAGNOSIS — Z87891 Personal history of nicotine dependence: Secondary | ICD-10-CM | POA: Insufficient documentation

## 2018-08-29 DIAGNOSIS — I2510 Atherosclerotic heart disease of native coronary artery without angina pectoris: Secondary | ICD-10-CM | POA: Insufficient documentation

## 2018-08-29 DIAGNOSIS — F1010 Alcohol abuse, uncomplicated: Secondary | ICD-10-CM | POA: Insufficient documentation

## 2018-08-29 DIAGNOSIS — I10 Essential (primary) hypertension: Secondary | ICD-10-CM | POA: Insufficient documentation

## 2018-08-29 DIAGNOSIS — Z79899 Other long term (current) drug therapy: Secondary | ICD-10-CM | POA: Insufficient documentation

## 2018-08-29 DIAGNOSIS — Z853 Personal history of malignant neoplasm of breast: Secondary | ICD-10-CM | POA: Insufficient documentation

## 2018-08-29 LAB — CBC WITH DIFFERENTIAL/PLATELET
Abs Immature Granulocytes: 0.02 10*3/uL (ref 0.00–0.07)
Basophils Absolute: 0 10*3/uL (ref 0.0–0.1)
Basophils Relative: 1 %
EOS ABS: 0 10*3/uL (ref 0.0–0.5)
Eosinophils Relative: 0 %
HCT: 47.4 % (ref 39.0–52.0)
Hemoglobin: 16.1 g/dL (ref 13.0–17.0)
Immature Granulocytes: 0 %
LYMPHS ABS: 0.8 10*3/uL (ref 0.7–4.0)
Lymphocytes Relative: 15 %
MCH: 30.5 pg (ref 26.0–34.0)
MCHC: 34 g/dL (ref 30.0–36.0)
MCV: 89.8 fL (ref 80.0–100.0)
Monocytes Absolute: 0.4 10*3/uL (ref 0.1–1.0)
Monocytes Relative: 7 %
Neutro Abs: 4.2 10*3/uL (ref 1.7–7.7)
Neutrophils Relative %: 77 %
Platelets: 112 10*3/uL — ABNORMAL LOW (ref 150–400)
RBC: 5.28 MIL/uL (ref 4.22–5.81)
RDW: 12.9 % (ref 11.5–15.5)
WBC: 5.4 10*3/uL (ref 4.0–10.5)
nRBC: 0 % (ref 0.0–0.2)

## 2018-08-29 LAB — COMPREHENSIVE METABOLIC PANEL
ALBUMIN: 5.2 g/dL — AB (ref 3.5–5.0)
ALT: 187 U/L — AB (ref 0–44)
AST: 244 U/L — AB (ref 15–41)
Alkaline Phosphatase: 58 U/L (ref 38–126)
Anion gap: 15 (ref 5–15)
BUN: 10 mg/dL (ref 6–20)
CO2: 26 mmol/L (ref 22–32)
CREATININE: 1 mg/dL (ref 0.61–1.24)
Calcium: 9.4 mg/dL (ref 8.9–10.3)
Chloride: 94 mmol/L — ABNORMAL LOW (ref 98–111)
GFR calc Af Amer: >60 mL/min (ref >60)
GFR calc non Af Amer: >60 mL/min (ref >60)
Glucose, Bld: 103 mg/dL — ABNORMAL HIGH (ref 70–99)
Potassium: 3.5 mmol/L (ref 3.5–5.1)
Sodium: 135 mmol/L (ref 135–145)
Total Bilirubin: 1.1 mg/dL (ref 0.3–1.2)
Total Protein: 8.4 g/dL — ABNORMAL HIGH (ref 6.5–8.1)

## 2018-08-29 LAB — LIPASE, BLOOD: Lipase: 32 U/L (ref 11–51)

## 2018-08-29 MED ORDER — FAMOTIDINE 20 MG PO TABS
20.0000 mg | ORAL_TABLET | Freq: Once | ORAL | Status: AC
  Administered 2018-08-29: 20 mg via ORAL
  Filled 2018-08-29: qty 1

## 2018-08-29 MED ORDER — SODIUM CHLORIDE 0.9 % IV SOLN
INTRAVENOUS | Status: DC
  Administered 2018-08-29: 16:00:00 via INTRAVENOUS

## 2018-08-29 MED ORDER — ONDANSETRON 4 MG PO TBDP
4.0000 mg | ORAL_TABLET | Freq: Once | ORAL | Status: AC
  Administered 2018-08-29: 4 mg via ORAL
  Filled 2018-08-29: qty 1

## 2018-08-29 MED ORDER — ALUM & MAG HYDROXIDE-SIMETH 200-200-20 MG/5ML PO SUSP
15.0000 mL | Freq: Once | ORAL | Status: AC
  Administered 2018-08-29: 15 mL via ORAL
  Filled 2018-08-29: qty 30

## 2018-08-29 MED ORDER — ONDANSETRON 4 MG PO TBDP: ORAL_TABLET | ORAL | 0 refills | Status: DC

## 2018-08-29 MED ORDER — LORAZEPAM 2 MG/ML IJ SOLN
2.0000 mg | Freq: Once | INTRAMUSCULAR | Status: AC
  Administered 2018-08-29: 2 mg via INTRAVENOUS
  Filled 2018-08-29: qty 1

## 2018-08-29 MED ORDER — LORAZEPAM 2 MG/ML IJ SOLN: 1.0000 mg | Freq: Once | INTRAMUSCULAR | Status: DC

## 2018-08-29 MED ORDER — CHLORDIAZEPOXIDE HCL 25 MG PO CAPS: ORAL_CAPSULE | ORAL | 0 refills | Status: DC

## 2018-08-29 MED ORDER — CHLORDIAZEPOXIDE HCL 25 MG PO CAPS
100.0000 mg | ORAL_CAPSULE | Freq: Once | ORAL | Status: AC
  Administered 2018-08-29: 100 mg via ORAL
  Filled 2018-08-29: qty 4

## 2018-08-29 NOTE — ED Notes (Signed)
Pt c/o chest pain over the past few hours. EKG obtained.

## 2018-08-29 NOTE — ED Provider Notes (Signed)
[name] DEPT Provider Note   CSN: [id number] Arrival date & time: 08/29/18  1328     History   Chief Complaint Chief Complaint  Patient presents with  . Anxiety  . Alcohol Intoxication    HPI [name] is a 38 y.o. male who presents to the ED via EMS s/p panic attack. Patient reports that this morning he was at home and started to feel anxious and like he couldn't breath so he took a vistaril but the symptoms continued so he took more. Patient reports he continues to feel anxious and has n/v. Patient reports he drinks daily and the past couple days he decided to stop drinking. Now he reports feeling like when he goes into DT's. Patient reports he is also starting to have hallucinations. Patient reports abdominal pain and chest pain and short of breath. He also feels shaky and confused. Patient reports he is afraid his liver and pancrease are messed up.  HPI  Past Medical History:  Diagnosis Date  . Angina   . Anxiety    panic attack  . Bipolar 1 disorder ([place])   . Breast CA ([place]) dx'd 2009   bil w/ bil masectomy and oral meds  . Cancer [facility])    kidney cancer  . Coronary artery disease   . Depression   . H/O suicide attempt 2015   overdose  . Headache(784.0)   . Hypercholesteremia   . Hypertension   . Liver cirrhosis ([place])   . Pancreatitis   . Peripheral vascular disease [facility]) April 2011   Left Pop  . Schizophrenia ([employer])   . Seizures ([place])    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  . Shortness of breath     Patient Active Problem List   Diagnosis Date Noted  . Polysubstance dependence including opioid type drug, continuous use ([place]) 08/02/2018  . Severe recurrent major depression without psychotic features ([place]) 07/29/2018  . Chronic left shoulder pain 01/11/2018  . Pain of left clavicle 01/11/2018  . Alcohol withdrawal seizure ([place]) 12/31/2017  . Abdominal pain 12/31/2017  . Alcohol withdrawal seizure without complication ([place])     . Melena 12/12/2017  . Emesis 10/20/2017  . Displaced fracture of lateral end of left clavicle, initial encounter for closed fracture 08/05/2017  . Coracoclavicular (ligament) sprain and strain, left, initial encounter 08/05/2017  . Closed dislocation of acromioclavicular joint, initial encounter 08/05/2017  . Insomnia   . Hypertension   . Severe episode of recurrent major depressive disorder, without psychotic features ([place])   . Depression, major, severe recurrence ([place]) 12/30/2015  . Substance induced mood disorder ([place]) 12/02/2015  . Mood disorder in conditions classified elsewhere   . Malnutrition of moderate degree 09/24/2015  . Alcohol withdrawal ([place]) 09/23/2015  . Tobacco use disorder 07/16/2015  . Elevated transaminase level   . Drug overdose, intentional ([place]) 07/12/2015  . Alcohol dependence with alcohol-induced mood disorder ([place]) 04/15/2015  . Cocaine abuse with cocaine-induced mood disorder ([place]) 04/11/2015  . Overdose 04/10/2015  . Severe recurrent major depressive disorder with psychotic features ([place])   . Severe major depression with psychotic features ([place]) 09/11/2014  . Alcohol-induced mood disorder ([place]) 09/10/2014  . Suicidal ideation   . Tylenol overdose   . Polysubstance abuse ([place])   . Overdose of acetaminophen 08/03/2014  . Overdose by acetaminophen 08/03/2014  . Cocaine abuse ([place]) 04/16/2014  . Thrombocytopenia ([place]) 04/15/2014  . Urinary tract infection, site not specified 04/15/2014  . Chest pain, unspecified 04/15/2014  .  D-dimer, elevated 04/15/2014  . Transaminitis 09/24/2013  . Cocaine dependence ([place]) 09/20/2013  . S/p nephrectomy 04/28/2013  . Seizure ([place]) 03/15/2013  . Syncope 01/02/2013  . Leukocytopenia, unspecified 01/02/2013  . Left kidney mass 12/24/2012  . PTSD (post-traumatic stress disorder) 07/06/2012  . Episode of recurrent major depressive disorder ([place]) 06/29/2012    Class: Acute  . Peripheral vascular disease ([place])  01/14/2012  . Alcohol abuse 10/13/2011  . SEIZURE DISORDER 10/03/2008  . Elevated LFTs 12/29/2007  . HYPERCHOLESTEROLEMIA 03/21/2007  . Essential hypertension 03/21/2007    Past Surgical History:  Procedure Laterality Date  . BREAST SURGERY    . BREAST SURGERY     bilateral breast silocone  removal  . CHEST SURGERY    . left kidney removal    . left leg surgery     "popiteal artery clogged"  . MASTECTOMY Bilateral   . NEPHRECTOMY Left   . ORIF CLAVICULAR FRACTURE Left 08/10/2017   Procedure: OPEN REDUCTION INTERNAL FIXATION (ORIF) LEFT CLAVICLE FRACTURE WITH RECONSTRUCTION OF CORACOCLAVICULAR LIGAMENT;  Surgeon: Leandrew Koyanagi, MD;  Location: [facility];  Service: Orthopedics;  Laterality: Left;  . RECONSTRUCTION OF CORACOCLAVICULAR LIGAMENT Left 08/10/2017   Procedure: RECONSTRUCTION OF CORACOCLAVICULAR LIGAMENT;  Surgeon: Leandrew Koyanagi, MD;  Location: [facility];  Service: Orthopedics;  Laterality: Left;        Home Medications    Prior to Admission medications   Medication Sig Start Date End Date Taking? Authorizing Provider  amLODipine (NORVASC) 10 MG tablet Take 1 tablet (10 mg total) by mouth daily. For high blood pressure 08/03/18   Nwoko, Herbert Pun I, NP  benzocaine (ORAJEL) 10 % mucosal gel Use as directed in the mouth or throat as needed for mouth pain. 08/02/18   Lindell Spar I, NP  busPIRone (BUSPAR) 10 MG tablet Take 1 tablet (10 mg total) by mouth 3 (three) times daily. For anxiety 08/02/18   Lindell Spar I, NP  hydrOXYzine (ATARAX/VISTARIL) 25 MG tablet Take 1 tablet (25 mg total) by mouth 3 (three) times daily. For anxiety 08/02/18   Lindell Spar I, NP  ibuprofen (ADVIL,MOTRIN) 600 MG tablet Take 1 tablet (600 mg total) by mouth every 6 (six) hours as needed for moderate pain. (May buy from over the counter): For mild to moderate pain 08/02/18   Lindell Spar I, NP  lamoTRIgine (LAMICTAL) 25 MG tablet Take 2 tablets (50 mg total) by mouth 2 (two) times daily. For mood stabilization  08/02/18   Lindell Spar I, NP  metoprolol succinate (TOPROL-XL) 50 MG 24 hr tablet Take 1 tablet (50 mg total) by mouth daily. Take with or immediately following a meal: For high blood pressure 08/03/18   Lindell Spar I, NP  traZODone (DESYREL) 100 MG tablet Take 2.5 tablets (250 mg total) by mouth at bedtime as needed for sleep. 08/02/18   Encarnacion Slates, NP    Family History Family History  Problem Relation Age of Onset  . Stroke Other   . Cancer Other   . Hyperlipidemia Mother   . Hypertension Mother     Social History Social History   Tobacco Use  . Smoking status: Former Research scientist (life sciences)  . Smokeless tobacco: Never Used  . Tobacco comment: smoked on and off 16 years- quit 2016  Substance Use Topics  . Alcohol use: Yes    Comment: 5 bottle of wine daily, last consumption 1 hr PTA  . Drug use: No    Frequency: 1.0 times per week  Types: "Crack" cocaine, Cocaine    Comment: clean 3 weeks     Allergies   Codeine; Penicillins; Morphine; Coconut flavor; Coconut oil; Grapefruit concentrate; Morphine and related; Oxycodone; and Norco [hydrocodone-acetaminophen]   Review of Systems Review of Systems  Constitutional: Positive for chills. Negative for fever.  HENT: Negative.   Eyes: Positive for visual disturbance. Negative for pain, discharge and redness.  Respiratory: Negative for cough and shortness of breath.   Cardiovascular: Positive for chest pain.  Gastrointestinal: Positive for abdominal pain, nausea and vomiting.  Genitourinary: Negative for dysuria, frequency and urgency.  Musculoskeletal: Negative for back pain and neck pain.  Skin: Negative for rash.  Neurological: Positive for light-headedness. Negative for headaches.  Psychiatric/Behavioral: Positive for confusion.     Physical Exam Updated Vital Signs BP 123/89 (BP Location: Left Arm)   Pulse (!) 110   Temp 98.3 F (36.8 C) (Oral)   Resp 18   Ht 5\' 6"  (1.676 m)   Wt 68 kg   SpO2 96%   BMI 24.21 kg/m    Physical Exam Vitals signs and nursing note reviewed.  Constitutional:      General: He is not in acute distress.    Appearance: He is well-developed.  HENT:     Head: Normocephalic.     Right Ear: Tympanic membrane normal.     Left Ear: Tympanic membrane normal.     Nose: Nose normal.     Mouth/Throat:     Mouth: Mucous membranes are moist.     Pharynx: Oropharynx is clear.  Eyes:     Extraocular Movements: Extraocular movements intact.     Conjunctiva/sclera: Conjunctivae normal.  Neck:     Musculoskeletal: Neck supple. No neck rigidity.  Cardiovascular:     Rate and Rhythm: Tachycardia present.  Pulmonary:     Effort: Pulmonary effort is normal. No respiratory distress.     Breath sounds: No wheezing, rhonchi or rales.  Abdominal:     Palpations: Abdomen is soft.     Tenderness: There is abdominal tenderness in the right upper quadrant, epigastric area and left upper quadrant.  Musculoskeletal: Normal range of motion.  Skin:    General: Skin is warm and dry.  Neurological:     Mental Status: He is alert and oriented to person, place, and time.  Psychiatric:        Mood and Affect: Mood is anxious.        Behavior: Behavior is agitated.      ED Treatments / Results  Labs (all labs ordered are listed, but only abnormal results are displayed) Labs Reviewed  URINALYSIS, ROUTINE W REFLEX MICROSCOPIC  CBC WITH DIFFERENTIAL/PLATELET  COMPREHENSIVE METABOLIC PANEL  LIPASE, BLOOD    Radiology No results found.  Procedures Procedures (including critical care time)  Medications Ordered in ED Medications  ondansetron (ZOFRAN-ODT) disintegrating tablet 4 mg (has no administration in time range)  0.9 %  sodium chloride infusion (has no administration in time range)     Initial Impression / Assessment and Plan / ED Course  I have reviewed the triage vital signs and the nursing notes.   Final Clinical Impressions(s) / ED Diagnoses  4 pm Patient moved to acute  side of ED for further evaluation and treatment. Report given to Dr. Tyrone Nine and he will assume care of the patient.    Debroah Baller [place], [place] 08/29/18 [place], [place], DO 08/29/18 1731

## 2018-08-29 NOTE — Discharge Instructions (Addendum)
Withdrawing from alcohol can kill you.  You are at high risk because you have had seizures and hallucinations before.  If you want to quit drinking you really need to check into an outpatient rehabilitation center.  If you start having worsening shaking then continue to drink alcohol or return to the ED for evaluation.

## 2018-08-29 NOTE — ED Triage Notes (Addendum)
Pt called EMS d/t panic attack, pt reports taking vistaril with no relief. Pt c/o anxiety. Pt denies SI/HI/AVH. Pt vomiting. Denies alcohol and drug use.

## 2018-08-29 NOTE — ED Notes (Signed)
EKG given to Dr.Butler for review.

## 2018-09-13 ENCOUNTER — Encounter (HOSPITAL_COMMUNITY): Payer: Self-pay | Admitting: *Deleted

## 2018-09-13 ENCOUNTER — Other Ambulatory Visit: Payer: Self-pay

## 2018-09-13 ENCOUNTER — Emergency Department (HOSPITAL_COMMUNITY)
Admission: EM | Admit: 2018-09-13 | Discharge: 2018-09-13 | Disposition: A | Discharge status: Home / Self Care | Payer: Self-pay | Attending: Emergency Medicine | Admitting: Emergency Medicine

## 2018-09-13 DIAGNOSIS — F1010 Alcohol abuse, uncomplicated: Secondary | ICD-10-CM | POA: Insufficient documentation

## 2018-09-13 DIAGNOSIS — Z79899 Other long term (current) drug therapy: Secondary | ICD-10-CM | POA: Insufficient documentation

## 2018-09-13 DIAGNOSIS — I10 Essential (primary) hypertension: Secondary | ICD-10-CM | POA: Insufficient documentation

## 2018-09-13 MED ORDER — DIAZEPAM 5 MG PO TABS
5.0000 mg | ORAL_TABLET | Freq: Once | ORAL | Status: AC
  Administered 2018-09-13: 5 mg via ORAL
  Filled 2018-09-13: qty 1

## 2018-09-13 MED ORDER — CHLORDIAZEPOXIDE HCL 25 MG PO CAPS
50.0000 mg | ORAL_CAPSULE | Freq: Once | ORAL | Status: AC
  Administered 2018-09-13: 50 mg via ORAL
  Filled 2018-09-13: qty 2

## 2018-09-13 MED ORDER — LORAZEPAM 2 MG/ML IJ SOLN
1.0000 mg | Freq: Once | INTRAMUSCULAR | Status: AC
  Administered 2018-09-13: 1 mg via INTRAVENOUS
  Filled 2018-09-13: qty 1

## 2018-09-13 MED ORDER — CHLORDIAZEPOXIDE HCL 25 MG PO CAPS: ORAL_CAPSULE | ORAL | 0 refills | Status: DC

## 2018-09-13 NOTE — ED Provider Notes (Signed)
[facility] EMERGENCY DEPARTMENT Provider Note   CSN: [id number] Arrival date & time: 09/13/18  4196    History   Chief Complaint Chief Complaint  Patient presents with  . Alcohol Problem    HPI [name] is a 38 y.o. male.     HPI Patient is a 38 year old male with a history of anxiety and alcohol abuse as well as a history of sleep disorder.  He states he drinks alcohol primarily to deal with anxiety and to help with sleep.  He reports over the last several days he was tapering himself off and then began developing some "shakiness" last night.  He drank 2 bottles of wine and 5 beers and came to the ER for evaluation at this time.  No hallucinations.  Mild tremor.  No tachycardia.  No headaches.  No other complaints at this time.   Past Medical History:  Diagnosis Date  . Angina   . Anxiety    panic attack  . Bipolar 1 disorder ([place])   . Breast CA ([place]) dx'd 2009   bil w/ bil masectomy and oral meds  . Cancer [facility])    kidney cancer  . Coronary artery disease   . Depression   . H/O suicide attempt 2015   overdose  . Headache(784.0)   . Hypercholesteremia   . Hypertension   . Liver cirrhosis ([place])   . Pancreatitis   . Peripheral vascular disease [facility]) April 2011   Left Pop  . Schizophrenia ([place])   . Seizures ([place])    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  . Shortness of breath     Patient Active Problem List   Diagnosis Date Noted  . Polysubstance dependence including opioid type drug, continuous use ([place]) 08/02/2018  . Severe recurrent major depression without psychotic features ([place]) 07/29/2018  . Chronic left shoulder pain 01/11/2018  . Pain of left clavicle 01/11/2018  . Alcohol withdrawal seizure ([place]) 12/31/2017  . Abdominal pain 12/31/2017  . Alcohol withdrawal seizure without complication ([place])   . Melena 12/12/2017  . Emesis 10/20/2017  . Displaced fracture of lateral end of left clavicle, initial encounter for closed fracture  08/05/2017  . Coracoclavicular (ligament) sprain and strain, left, initial encounter 08/05/2017  . Closed dislocation of acromioclavicular joint, initial encounter 08/05/2017  . Insomnia   . Hypertension   . Severe episode of recurrent major depressive disorder, without psychotic features ([place])   . Depression, major, severe recurrence ([place]) 12/30/2015  . Substance induced mood disorder ([place]) 12/02/2015  . Mood disorder in conditions classified elsewhere   . Malnutrition of moderate degree 09/24/2015  . Alcohol withdrawal ([place]) 09/23/2015  . Tobacco use disorder 07/16/2015  . Elevated transaminase level   . Drug overdose, intentional ([place]) 07/12/2015  . Alcohol dependence with alcohol-induced mood disorder ([place]) 04/15/2015  . Cocaine abuse with cocaine-induced mood disorder ([place]) 04/11/2015  . Overdose 04/10/2015  . Severe recurrent major depressive disorder with psychotic features ([place])   . Severe major depression with psychotic features ([place]) 09/11/2014  . Alcohol-induced mood disorder ([place]) 09/10/2014  . Suicidal ideation   . Tylenol overdose   . Polysubstance abuse ([place])   . Overdose of acetaminophen 08/03/2014  . Overdose by acetaminophen 08/03/2014  . Cocaine abuse ([place]) 04/16/2014  . Thrombocytopenia ([place]) 04/15/2014  . Urinary tract infection, site not specified 04/15/2014  . Chest pain, unspecified 04/15/2014  . D-dimer, elevated 04/15/2014  . Transaminitis 09/24/2013  . Cocaine dependence ([place]) 09/20/2013  . S/p nephrectomy 04/28/2013  .  Seizure ([place]) 03/15/2013  . Syncope 01/02/2013  . Leukocytopenia, unspecified 01/02/2013  . Left kidney mass 12/24/2012  . PTSD (post-traumatic stress disorder) 07/06/2012  . Episode of recurrent major depressive disorder ([place]) 06/29/2012    Class: Acute  . Peripheral vascular disease ([place]) 01/14/2012  . Alcohol abuse 10/13/2011  . SEIZURE DISORDER 10/03/2008  . Elevated LFTs 12/29/2007  . HYPERCHOLESTEROLEMIA 03/21/2007  .  Essential hypertension 03/21/2007    Past Surgical History:  Procedure Laterality Date  . BREAST SURGERY    . BREAST SURGERY     bilateral breast silocone  removal  . CHEST SURGERY    . left kidney removal    . left leg surgery     "popiteal artery clogged"  . MASTECTOMY Bilateral   . NEPHRECTOMY Left   . ORIF CLAVICULAR FRACTURE Left 08/10/2017   Procedure: OPEN REDUCTION INTERNAL FIXATION (ORIF) LEFT CLAVICLE FRACTURE WITH RECONSTRUCTION OF CORACOCLAVICULAR LIGAMENT;  Surgeon: Leandrew Koyanagi, MD;  Location: [facility];  Service: Orthopedics;  Laterality: Left;  . RECONSTRUCTION OF CORACOCLAVICULAR LIGAMENT Left 08/10/2017   Procedure: RECONSTRUCTION OF CORACOCLAVICULAR LIGAMENT;  Surgeon: Leandrew Koyanagi, MD;  Location: [facility];  Service: Orthopedics;  Laterality: Left;        Home Medications    Prior to Admission medications   Medication Sig Start Date End Date Taking? Authorizing Provider  amLODipine (NORVASC) 10 MG tablet Take 1 tablet (10 mg total) by mouth daily. For high blood pressure 08/03/18   Nwoko, Herbert Pun I, NP  busPIRone (BUSPAR) 10 MG tablet Take 1 tablet (10 mg total) by mouth 3 (three) times daily. For anxiety 08/02/18   Lindell Spar I, NP  chlordiazePOXIDE (LIBRIUM) 25 MG capsule 50mg  PO TID x 1D, then 25-50mg  PO BID X 1D, then 25-50mg  PO QD X 1D 09/13/18   Jola Schmidt, MD  hydrochlorothiazide (HYDRODIURIL) 25 MG tablet Take 25 mg by mouth daily.    [provider]  hydrOXYzine (ATARAX/VISTARIL) 25 MG tablet Take 1 tablet (25 mg total) by mouth 3 (three) times daily. For anxiety 08/02/18   Lindell Spar I, NP  ibuprofen (ADVIL,MOTRIN) 600 MG tablet Take 1 tablet (600 mg total) by mouth every 6 (six) hours as needed for moderate pain. (May buy from over the counter): For mild to moderate pain 08/02/18   Lindell Spar I, NP  lamoTRIgine (LAMICTAL) 100 MG tablet Take 100 mg by mouth 2 (two) times daily.    [provider]  lamoTRIgine (LAMICTAL) 25 MG tablet Take 2  tablets (50 mg total) by mouth 2 (two) times daily. For mood stabilization Patient not taking: Reported on 08/29/2018 08/02/18   Lindell Spar I, NP  metoprolol succinate (TOPROL-XL) 50 MG 24 hr tablet Take 1 tablet (50 mg total) by mouth daily. Take with or immediately following a meal: For high blood pressure 08/03/18   Lindell Spar I, NP  ondansetron (ZOFRAN ODT) 4 MG disintegrating tablet 4mg  ODT q4 hours prn nausea/vomit 08/29/18   Deno Etienne, DO  traZODone (DESYREL) 100 MG tablet Take 2.5 tablets (250 mg total) by mouth at bedtime as needed for sleep. 08/02/18   Encarnacion Slates, NP    Family History Family History  Problem Relation Age of Onset  . Stroke Other   . Cancer Other   . Hyperlipidemia Mother   . Hypertension Mother     Social History Social History   Tobacco Use  . Smoking status: Former Research scientist (life sciences)  . Smokeless tobacco: Never Used  . Tobacco comment:  smoked on and off 16 years- quit 2016  Substance Use Topics  . Alcohol use: Yes    Comment: 5 bottle of wine daily, last consumption 1 hr PTA  . Drug use: No    Frequency: 1.0 times per week    Types: "Crack" cocaine, Cocaine    Comment: clean 3 weeks     Allergies   Codeine; Penicillins; Morphine; Coconut flavor; Coconut oil; Grapefruit concentrate; Morphine and related; Oxycodone; and Norco [hydrocodone-acetaminophen]   Review of Systems Review of Systems  All other systems reviewed and are negative.    Physical Exam Updated Vital Signs BP (!) 128/92 (BP Location: Right Arm)   Pulse 92   Temp 98 F (36.7 C) (Oral)   Resp 20   Ht 5\' 6"  (1.676 m)   Wt 68.4 kg   SpO2 93%   BMI 24.34 kg/m   Physical Exam Vitals signs and nursing note reviewed.  Constitutional:      Appearance: He is well-developed.  HENT:     Head: Normocephalic and atraumatic.  Neck:     Musculoskeletal: Normal range of motion.  Cardiovascular:     Rate and Rhythm: Normal rate and regular rhythm.     Heart sounds: Normal heart  sounds.  Pulmonary:     Effort: Pulmonary effort is normal. No respiratory distress.     Breath sounds: Normal breath sounds.  Abdominal:     General: There is no distension.     Palpations: Abdomen is soft.     Tenderness: There is no abdominal tenderness.  Musculoskeletal: Normal range of motion.  Skin:    General: Skin is warm and dry.  Neurological:     Mental Status: He is alert and oriented to person, place, and time.  Psychiatric:        Judgment: Judgment normal.      ED Treatments / Results  Labs (all labs ordered are listed, but only abnormal results are displayed) Labs Reviewed - No data to display  EKG None  Radiology No results found.  Procedures Procedures (including critical care time)  Medications Ordered in ED Medications  LORazepam (ATIVAN) injection 1 mg (1 mg Intravenous Given 09/13/18 1046)  diazepam (VALIUM) tablet 5 mg (5 mg Oral Given 09/13/18 1044)  chlordiazePOXIDE (LIBRIUM) capsule 50 mg (50 mg Oral Given 09/13/18 1044)     Initial Impression / Assessment and Plan / ED Course  I have reviewed the triage vital signs and the nursing notes.  Pertinent labs & imaging results that were available during my care of the patient were reviewed by me and considered in my medical decision making (see chart for details).        Very mild tremor at this time.  No tachycardia.  Patient is overall well-appearing.  He feels much better after Ativan, Librium, Valium.  Patient be discharged home with Librium taper.  He is followed at [facility] before for his alcohol abuse.  He will call them for ongoing follow-up and additional outpatient recommendations for alcohol dependence.  He understands return the emergency department for new or worsening symptoms.  No indication for additional work-up at this time.  I do not think he needs acute hospitalization for alcohol withdrawal at this time.  Final Clinical Impressions(s) / ED Diagnoses   Final diagnoses:    Alcohol abuse    ED Discharge Orders         Ordered    chlordiazePOXIDE (LIBRIUM) 25 MG capsule     09/13/18 1100  Jola Schmidt, MD 09/13/18 [phone]

## 2018-09-13 NOTE — ED Notes (Signed)
Did ekg shown to Dr Venora Maples

## 2018-09-13 NOTE — ED Notes (Signed)
Patient is in a gown on the monitor call bell in reach

## 2018-09-13 NOTE — ED Triage Notes (Signed)
PT reports drinking 2 bottles of wine and 5  Beers at 0100 today. Pt called EMS because he was having withdrawal Sx's.  Pt reports having had a seizure with Alcohol in the past.

## 2018-11-21 ENCOUNTER — Ambulatory Visit (HOSPITAL_COMMUNITY)
Admission: EM | Admit: 2018-11-21 | Discharge: 2018-11-21 | Disposition: A | Discharge status: Home / Self Care | Payer: Self-pay | Attending: Family Medicine | Admitting: Family Medicine

## 2018-11-21 ENCOUNTER — Encounter (HOSPITAL_COMMUNITY): Payer: Self-pay

## 2018-11-21 ENCOUNTER — Other Ambulatory Visit: Payer: Self-pay

## 2018-11-21 DIAGNOSIS — H8110 Benign paroxysmal vertigo, unspecified ear: Secondary | ICD-10-CM

## 2018-11-21 DIAGNOSIS — R112 Nausea with vomiting, unspecified: Secondary | ICD-10-CM

## 2018-11-21 MED ORDER — ONDANSETRON HCL 4 MG PO TABS: 4.0000 mg | ORAL_TABLET | Freq: Four times a day (QID) | ORAL | 0 refills | Status: DC

## 2018-11-21 MED ORDER — ONDANSETRON 4 MG PO TBDP
ORAL_TABLET | ORAL | Status: AC
  Filled 2018-11-21: qty 1

## 2018-11-21 MED ORDER — ONDANSETRON 4 MG PO TBDP
4.0000 mg | ORAL_TABLET | Freq: Once | ORAL | Status: AC
  Administered 2018-11-21: 4 mg via ORAL

## 2018-11-21 NOTE — ED Triage Notes (Signed)
Pt cc has a vertigo and he has meds for it. But the pt states that he has been vomiting since 5:30 am this morning. Pt states the nausea is just taking over.

## 2018-11-21 NOTE — ED Provider Notes (Addendum)
[place]   [id number] 11/21/18 Arrival Time: 5284  CC: DIZZINESS and nausea  SUBJECTIVE:  [name] is a 38 y.o. male hx significant for bipolar 1 disorder, H/O kidney and breast cancer, CAD, HTN, HLD, liver cirrhosis, H/O pancreatitis, PVD, schizophrenia, depression, alcohol abuse, and H/O suicide attempt, who presents with complaint of vertigo with nausea that began this morning.  Denies a precipitating event, trauma.  Describes the dizziness as "the room spinning." States that it is intermittent.  Has tried meclizine with relief, but still complains of nasuea.  Symptoms made worse with sudden position changes, and laying down.  Admits to previous symptoms that improved with meclizine and zofran.  Complains of 3 episodes of vomiting that occurred this morning.  Denies fever, chills, hearing changes, tinnitus, ear pain, chest pain, syncope, SOB, weakness, slurred speech, memory or emotional changes, facial drooping/ asymmetry, incoordination, numbness or tingling, abdominal pain, changes in bowel or bladder habits.    ROS: As per HPI. Past Medical History:  Diagnosis Date  . Angina   . Anxiety    panic attack  . Bipolar 1 disorder ([place])   . Breast CA ([place]) dx'd 2009   bil w/ bil masectomy and oral meds  . Cancer [facility])    kidney cancer  . Coronary artery disease   . Depression   . H/O suicide attempt 2015   overdose  . Headache(784.0)   . Hypercholesteremia   . Hypertension   . Liver cirrhosis ([place])   . Pancreatitis   . Peripheral vascular disease [facility]) April 2011   Left Pop  . Schizophrenia ([place])   . Seizures ([place])    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  . Shortness of breath    Past Surgical History:  Procedure Laterality Date  . BREAST SURGERY    . BREAST SURGERY     bilateral breast silocone  removal  . CHEST SURGERY    . left kidney removal    . left leg surgery     "popiteal artery clogged"  . MASTECTOMY Bilateral   . NEPHRECTOMY Left   . ORIF  CLAVICULAR FRACTURE Left 08/10/2017   Procedure: OPEN REDUCTION INTERNAL FIXATION (ORIF) LEFT CLAVICLE FRACTURE WITH RECONSTRUCTION OF CORACOCLAVICULAR LIGAMENT;  Surgeon: Leandrew Koyanagi, MD;  Location: [place];  Service: Orthopedics;  Laterality: Left;  . RECONSTRUCTION OF CORACOCLAVICULAR LIGAMENT Left 08/10/2017   Procedure: RECONSTRUCTION OF CORACOCLAVICULAR LIGAMENT;  Surgeon: Leandrew Koyanagi, MD;  Location: [place];  Service: Orthopedics;  Laterality: Left;   Allergies  Allergen Reactions  . Codeine Hives, Itching, Swelling and Other (See Comments)    Does not impair breathing, however  . Penicillins Swelling and Other (See Comments)    Has patient had a PCN reaction causing immediate rash, facial/tongue/throat swelling, SOB or lightheadedness with hypotension: Yes Has patient had a PCN reaction causing severe rash involving mucus membranes or skin necrosis: Yes Has patient had a PCN reaction that required hospitalization Yes-ed visit Has patient had a PCN reaction occurring within the last 10 years: Yes If all of the above answers are "NO", then may proceed with Cephalosporin use.   Marland Kitchen Morphine Itching  . Coconut Flavor Itching and Swelling  . Coconut Oil Itching, Swelling and Other (See Comments)    Cannot take with some of his meds (also)  . Grapefruit Concentrate Other (See Comments)    Cannot take with some of his meds  . Morphine And Related Itching and Swelling  . Oxycodone Itching and Swelling  .  Norco [Hydrocodone-Acetaminophen] Itching and Rash   No current facility-administered medications on file prior to encounter.    Current Outpatient Medications on File Prior to Encounter  Medication Sig Dispense Refill  . amLODipine (NORVASC) 10 MG tablet Take 1 tablet (10 mg total) by mouth daily. For high blood pressure 30 tablet 0  . busPIRone (BUSPAR) 10 MG tablet Take 1 tablet (10 mg total) by mouth 3 (three) times daily. For anxiety 90 tablet 0  . chlordiazePOXIDE (LIBRIUM) 25 MG  capsule 50mg  PO TID x 1D, then 25-50mg  PO BID X 1D, then 25-50mg  PO QD X 1D 15 capsule 0  . hydrochlorothiazide (HYDRODIURIL) 25 MG tablet Take 25 mg by mouth daily.    . hydrOXYzine (ATARAX/VISTARIL) 25 MG tablet Take 1 tablet (25 mg total) by mouth 3 (three) times daily. For anxiety 60 tablet 0  . ibuprofen (ADVIL,MOTRIN) 600 MG tablet Take 1 tablet (600 mg total) by mouth every 6 (six) hours as needed for moderate pain. (May buy from over the counter): For mild to moderate pain 1 tablet 0  . lamoTRIgine (LAMICTAL) 100 MG tablet Take 100 mg by mouth 2 (two) times daily.    Marland Kitchen lamoTRIgine (LAMICTAL) 25 MG tablet Take 2 tablets (50 mg total) by mouth 2 (two) times daily. For mood stabilization (Patient not taking: Reported on 08/29/2018) 120 tablet 0  . metoprolol succinate (TOPROL-XL) 50 MG 24 hr tablet Take 1 tablet (50 mg total) by mouth daily. Take with or immediately following a meal: For high blood pressure 30 tablet 0  . traZODone (DESYREL) 100 MG tablet Take 2.5 tablets (250 mg total) by mouth at bedtime as needed for sleep. 90 tablet 0   Social History   Socioeconomic History  . Marital status: Single    Spouse name: Not on file  . Number of children: Not on file  . Years of education: Not on file  . Highest education level: Not on file  Occupational History  . Not on file  Social Needs  . Financial resource strain: Not on file  . Food insecurity:    Worry: Not on file    Inability: Not on file  . Transportation needs:    Medical: Not on file    Non-medical: Not on file  Tobacco Use  . Smoking status: Former Research scientist (life sciences)  . Smokeless tobacco: Never Used  . Tobacco comment: smoked on and off 16 years- quit 2016  Substance and Sexual Activity  . Alcohol use: Yes    Comment: 5 bottle of wine daily, last consumption 1 hr PTA  . Drug use: No    Frequency: 1.0 times per week    Types: "Crack" cocaine, Cocaine    Comment: clean 3 weeks  . Sexual activity: Yes    Birth  control/protection: Condom    Comment: anal  Lifestyle  . Physical activity:    Days per week: Not on file    Minutes per session: Not on file  . Stress: Not on file  Relationships  . Social connections:    Talks on phone: Not on file    Gets together: Not on file    Attends religious service: Not on file    Active member of club or organization: Not on file    Attends meetings of clubs or organizations: Not on file    Relationship status: Not on file  . Intimate partner violence:    Fear of current or ex partner: Not on file    Emotionally  abused: Not on file    Physically abused: Not on file    Forced sexual activity: Not on file  Other Topics Concern  . Not on file  Social History Narrative   ** Merged History Encounter **       ** Merged History Encounter **       Family History  Problem Relation Age of Onset  . Stroke Other   . Cancer Other   . Hyperlipidemia Mother   . Hypertension Mother     OBJECTIVE:  Vitals:   11/21/18 1219 11/21/18 1220  BP:  128/89  Pulse:  83  Resp:  18  Temp:  98.2 F (36.8 C)  TempSrc:  Oral  SpO2:  99%  Weight: 160 lb (72.6 kg)     General appearance: alert; no distress Eyes: PERRLA; EOMI; conjunctiva normal HENT: normocephalic; atraumatic; TMs normal; nasal mucosa normal; oral mucosa normal; poor dentition Neck: supple with FROM Lungs: clear to auscultation bilaterally Heart: regular rate and rhythm Abdomen: soft, non-tender; bowel sounds normal Extremities: no cyanosis or edema; symmetrical with no gross deformities Skin: warm and dry Neurologic: normal gait; CN 2-12 grossly intact; strength and sensation intact about the bilateral upper and lower extremities; negative pronator drift; finger to nose without difficulty Psychological: alert and cooperative; normal mood and affect   ASSESSMENT & PLAN:  1. Benign paroxysmal positional vertigo, unspecified laterality   2. Non-intractable vomiting with nausea, unspecified  vomiting type     Meds ordered this encounter  Medications  . ondansetron (ZOFRAN-ODT) disintegrating tablet 4 mg  . ondansetron (ZOFRAN) 4 MG tablet    Sig: Take 1 tablet (4 mg total) by mouth every 6 (six) hours.    Dispense:  12 tablet    Refill:  0    Order Specific Question:   Supervising Provider    Answer:   Raylene Everts [[id number]]   Zofran given in office Use meclizine as prescribed.  This medication may make you drowsy so use with cautions while driving or operating heavy machinery. Zofran prescribed.  Take as needed for nausea Follow up with PCP if symptoms persists Return or go to the ER if you have any new or worsening symptoms vision changes, slurred speech, facial droop, weakness, memory changes, persistent vomiting, worsening symptoms despite medications, chest pain, shortness of breath, fever, chills, etc...  Reviewed expectations re: course of current medical issues. Questions answered. Outlined signs and symptoms indicating need for more acute intervention. Patient verbalized understanding. After Visit Summary given.    Lestine Box, PA-C 11/21/18 [address] [place], [place] 11/21/18 1405

## 2018-11-21 NOTE — Discharge Instructions (Signed)
Zofran given in office Use meclizine as prescribed.  This medication may make you drowsy so use with cautions while driving or operating heavy machinery. Zofran prescribed.  Take as needed for nausea Follow up with PCP if symptoms persists Return or go to the ER if you have any new or worsening symptoms vision changes, slurred speech, facial droop, weakness, memory changes, persistent vomiting, worsening symptoms despite medications, chest pain, shortness of breath, fever, chills, etc..Marland Kitchen

## 2019-03-06 DIAGNOSIS — F419 Anxiety disorder, unspecified: Secondary | ICD-10-CM | POA: Insufficient documentation

## 2019-04-22 ENCOUNTER — Encounter (HOSPITAL_COMMUNITY): Payer: Self-pay

## 2019-04-22 ENCOUNTER — Other Ambulatory Visit: Payer: Self-pay

## 2019-04-22 ENCOUNTER — Emergency Department (HOSPITAL_COMMUNITY)
Admission: EM | Admit: 2019-04-22 | Discharge: 2019-04-22 | Disposition: A | Discharge status: Home / Self Care | Payer: Self-pay | Attending: Emergency Medicine | Admitting: Emergency Medicine

## 2019-04-22 DIAGNOSIS — Z20828 Contact with and (suspected) exposure to other viral communicable diseases: Secondary | ICD-10-CM | POA: Insufficient documentation

## 2019-04-22 DIAGNOSIS — Z87891 Personal history of nicotine dependence: Secondary | ICD-10-CM | POA: Insufficient documentation

## 2019-04-22 DIAGNOSIS — I10 Essential (primary) hypertension: Secondary | ICD-10-CM | POA: Insufficient documentation

## 2019-04-22 DIAGNOSIS — R05 Cough: Secondary | ICD-10-CM | POA: Insufficient documentation

## 2019-04-22 DIAGNOSIS — Z85528 Personal history of other malignant neoplasm of kidney: Secondary | ICD-10-CM | POA: Insufficient documentation

## 2019-04-22 DIAGNOSIS — Z853 Personal history of malignant neoplasm of breast: Secondary | ICD-10-CM | POA: Insufficient documentation

## 2019-04-22 DIAGNOSIS — Z905 Acquired absence of kidney: Secondary | ICD-10-CM | POA: Insufficient documentation

## 2019-04-22 DIAGNOSIS — Z79899 Other long term (current) drug therapy: Secondary | ICD-10-CM | POA: Insufficient documentation

## 2019-04-22 DIAGNOSIS — R070 Pain in throat: Secondary | ICD-10-CM | POA: Insufficient documentation

## 2019-04-22 MED ORDER — BENZONATATE 100 MG PO CAPS: 100.0000 mg | ORAL_CAPSULE | Freq: Three times a day (TID) | ORAL | 0 refills | Status: DC

## 2019-04-22 MED ORDER — BENZONATATE 100 MG PO CAPS
200.0000 mg | ORAL_CAPSULE | Freq: Once | ORAL | Status: AC
  Administered 2019-04-22: 200 mg via ORAL
  Filled 2019-04-22: qty 2

## 2019-04-22 MED ORDER — PREDNISONE 10 MG (21) PO TBPK: ORAL_TABLET | ORAL | 0 refills | Status: DC

## 2019-04-22 MED ORDER — PREDNISONE 20 MG PO TABS
60.0000 mg | ORAL_TABLET | Freq: Once | ORAL | Status: AC
  Administered 2019-04-22: 09:00:00 60 mg via ORAL
  Filled 2019-04-22: qty 3

## 2019-04-22 NOTE — Discharge Instructions (Addendum)
Hand washing: Wash your hands throughout the day, but especially before and after touching the face, using the restroom, sneezing, coughing, or touching surfaces that have been coughed or sneezed upon. Hydration: Symptoms of most illnesses will be intensified and complicated by dehydration. Dehydration can also extend the duration of symptoms. Drink plenty of fluids and get plenty of rest. You should be drinking at least half a liter of water an hour to stay hydrated. Electrolyte drinks (ex. Gatorade, Powerade, Pedialyte) are also encouraged. You should be drinking enough fluids to make your urine light yellow, almost clear. If this is not the case, you are not drinking enough water. Please note that some of the treatments indicated below will not be effective if you are not adequately hydrated. Diet: Please concentrate on hydration, however, you may introduce food slowly.  Start with a clear liquid diet, progressed to a full liquid diet, and then bland solids as you are able. Pain or fever: Ibuprofen, Naproxen, or acetaminophen (generic for Tylenol) for pain or fever.  Antiinflammatory medications: Take 600 mg of ibuprofen every 6 hours or 440 mg (over the counter dose) to 500 mg (prescription dose) of naproxen every 12 hours for the next 3 days. After this time, these medications may be used as needed for pain. Take these medications with food to avoid upset stomach. Choose only one of these medications, do not take them together. Acetaminophen (generic for Tylenol): Should you continue to have additional pain while taking the ibuprofen or naproxen, you may add in acetaminophen as needed. Your daily total maximum amount of acetaminophen from all sources should be limited to 4000mg /day for persons without liver problems, or 2000mg /day for those with liver problems. Cough: Use the benzonatate (generic for Tessalon) for cough.  Teas, warm liquids, broths, and honey can also help with cough. Prednisone:  Take the prednisone, as directed, in its entirety. Zyrtec or Claritin: May add these medication daily to control underlying symptoms of congestion, sneezing, and other signs of allergies.  These medications are available over-the-counter. Generics: Cetirizine (generic for Zyrtec) and loratadine (generic for Claritin). Fluticasone: Use fluticasone (generic for Flonase), as directed, for nasal and sinus congestion.  This medication is available over-the-counter. Congestion: Plain guaifenesin (generic for plain Mucinex) may help relieve congestion. Saline sinus rinses and saline nasal sprays may also help relieve congestion. If you do not have high blood pressure, heart problems, or an allergy to such medications, you may also try phenylephrine or Sudafed. Sore throat: Warm liquids or Chloraseptic spray may help soothe a sore throat. Gargle twice a day with a salt water solution made from a half teaspoon of salt in a cup of warm water.  Follow up: Follow up with a primary care provider this week, as planned.  If symptoms fail to resolve, may need to follow-up with ear nose and throat (ENT) specialist. Return: Return to the ED for significantly worsening symptoms, shortness of breath, persistent vomiting, large amounts of blood in stool, or any other major concerns.  For prescription assistance, may try using prescription discount sites or apps, such as goodrx.com  You have a test pending for COVID-19.  Results typically return within about 48 hours.  Be sure to check MyChart for updated results.  We recommend isolating yourself until results are received.  Patients who have symptoms consistent with COVID-19 should self isolated for: At least 3 days (72 hours) have passed since recovery, defined as resolution of fever without the use of fever reducing medications and improvement  in respiratory symptoms (e.g., cough, shortness of breath), and At least 7 days have passed since symptoms first appeared.  If  you have no symptoms, but your test returns positive, recommend isolating for at least 10 days.

## 2019-04-22 NOTE — ED Triage Notes (Signed)
Pt arrives [facility] for cough, congestion, and sore throat. Pt denies any fever or exposure to anyone with known Covid infection.

## 2019-04-22 NOTE — ED Provider Notes (Signed)
[name] DEPT Provider Note   CSN: [id number] Arrival date & time: 04/22/19  [id number]     History   Chief Complaint Chief Complaint  Patient presents with  . Cough  . Sore Throat    HPI [name] is a 38 y.o. male.     HPI  [name] is a 38 y.o. male, with a history of tobacco use, breast cancer, kidney cancer, CAD, hypercholesterolemia, HTN, presenting to the ED with throat pain and cough beginning yesterday.  States pain is intermittent, anterior throat, moderate in intensity, sharp, nonradiating.  States he feels as though this irritation is causing an intermittent cough.  States, "I do not feel bad at all, I just have this pain that is annoying."  He also admits to some nasal congestion. He had similar pain a couple months ago that resolved after a few days.  Started back with smoking a month ago. Denies alcohol/illicit drug use for last 7 months.  He has an appointment already scheduled with his PCP for Tuesday, October 6.  Denies fever/chills, N/V/D, chest pain, shortness of breath, abdominal pain, voice change, difficulty swallowing, increased pain with swallowing, trauma, or any other complaints.   Past Medical History:  Diagnosis Date  . Angina   . Anxiety    panic attack  . Bipolar 1 disorder ([place])   . Breast CA ([place]) dx'd 2009   bil w/ bil masectomy and oral meds  . Cancer [facility])    kidney cancer  . Coronary artery disease   . Depression   . H/O suicide attempt 2015   overdose  . Headache(784.0)   . Hypercholesteremia   . Hypertension   . Liver cirrhosis ([place])   . Pancreatitis   . Peripheral vascular disease [facility]) April 2011   Left Pop  . Schizophrenia ([place])   . Seizures ([place])    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  . Shortness of breath     Patient Active Problem List   Diagnosis Date Noted  . Polysubstance dependence including opioid type drug, continuous use ([place]) 08/02/2018  . Severe recurrent major depression  without psychotic features ([place]) 07/29/2018  . Chronic left shoulder pain 01/11/2018  . Pain of left clavicle 01/11/2018  . Alcohol withdrawal seizure ([place]) 12/31/2017  . Abdominal pain 12/31/2017  . Alcohol withdrawal seizure without complication ([place])   . Melena 12/12/2017  . Emesis 10/20/2017  . Displaced fracture of lateral end of left clavicle, initial encounter for closed fracture 08/05/2017  . Coracoclavicular (ligament) sprain and strain, left, initial encounter 08/05/2017  . Closed dislocation of acromioclavicular joint, initial encounter 08/05/2017  . Insomnia   . Hypertension   . Severe episode of recurrent major depressive disorder, without psychotic features ([place])   . Depression, major, severe recurrence ([place]) 12/30/2015  . Substance induced mood disorder ([place]) 12/02/2015  . Mood disorder in conditions classified elsewhere   . Malnutrition of moderate degree 09/24/2015  . Alcohol withdrawal ([place]) 09/23/2015  . Tobacco use disorder 07/16/2015  . Elevated transaminase level   . Drug overdose, intentional ([place]) 07/12/2015  . Alcohol dependence with alcohol-induced mood disorder ([place]) 04/15/2015  . Cocaine abuse with cocaine-induced mood disorder ([place]) 04/11/2015  . Overdose 04/10/2015  . Severe recurrent major depressive disorder with psychotic features ([place])   . Severe major depression with psychotic features ([place]) 09/11/2014  . Alcohol-induced mood disorder ([place]) 09/10/2014  . Suicidal ideation   . Tylenol overdose   . Polysubstance abuse ([place])   .  Overdose of acetaminophen 08/03/2014  . Overdose by acetaminophen 08/03/2014  . Cocaine abuse ([place]) 04/16/2014  . Thrombocytopenia ([place]) 04/15/2014  . Urinary tract infection, site not specified 04/15/2014  . Chest pain, unspecified 04/15/2014  . D-dimer, elevated 04/15/2014  . Transaminitis 09/24/2013  . Cocaine dependence ([place]) 09/20/2013  . S/p nephrectomy 04/28/2013  . Seizure ([employer]) 03/15/2013  . Syncope 01/02/2013   . Leukocytopenia, unspecified 01/02/2013  . Left kidney mass 12/24/2012  . PTSD (post-traumatic stress disorder) 07/06/2012  . Episode of recurrent major depressive disorder ([place]) 06/29/2012    Class: Acute  . Peripheral vascular disease ([place]) 01/14/2012  . Alcohol abuse 10/13/2011  . SEIZURE DISORDER 10/03/2008  . Elevated LFTs 12/29/2007  . HYPERCHOLESTEROLEMIA 03/21/2007  . Essential hypertension 03/21/2007    Past Surgical History:  Procedure Laterality Date  . BREAST SURGERY    . BREAST SURGERY     bilateral breast silocone  removal  . CHEST SURGERY    . left kidney removal    . left leg surgery     "popiteal artery clogged"  . MASTECTOMY Bilateral   . NEPHRECTOMY Left   . ORIF CLAVICULAR FRACTURE Left 08/10/2017   Procedure: OPEN REDUCTION INTERNAL FIXATION (ORIF) LEFT CLAVICLE FRACTURE WITH RECONSTRUCTION OF CORACOCLAVICULAR LIGAMENT;  Surgeon: Leandrew Koyanagi, MD;  Location: [place];  Service: Orthopedics;  Laterality: Left;  . RECONSTRUCTION OF CORACOCLAVICULAR LIGAMENT Left 08/10/2017   Procedure: RECONSTRUCTION OF CORACOCLAVICULAR LIGAMENT;  Surgeon: Leandrew Koyanagi, MD;  Location: [place];  Service: Orthopedics;  Laterality: Left;        Home Medications    Prior to Admission medications   Medication Sig Start Date End Date Taking? Authorizing Provider  amLODipine (NORVASC) 10 MG tablet Take 1 tablet (10 mg total) by mouth daily. For high blood pressure 08/03/18   Nwoko, Herbert Pun I, NP  benzonatate (TESSALON) 100 MG capsule Take 1 capsule (100 mg total) by mouth every 8 (eight) hours. 04/22/19   Nitzia Perren C, PA-C  busPIRone (BUSPAR) 10 MG tablet Take 1 tablet (10 mg total) by mouth 3 (three) times daily. For anxiety 08/02/18   Lindell Spar I, NP  chlordiazePOXIDE (LIBRIUM) 25 MG capsule 50mg  PO TID x 1D, then 25-50mg  PO BID X 1D, then 25-50mg  PO QD X 1D 09/13/18   Jola Schmidt, MD  hydrochlorothiazide (HYDRODIURIL) 25 MG tablet Take 25 mg by mouth daily.    [provider]  hydrOXYzine (ATARAX/VISTARIL) 25 MG tablet Take 1 tablet (25 mg total) by mouth 3 (three) times daily. For anxiety 08/02/18   Lindell Spar I, NP  ibuprofen (ADVIL,MOTRIN) 600 MG tablet Take 1 tablet (600 mg total) by mouth every 6 (six) hours as needed for moderate pain. (May buy from over the counter): For mild to moderate pain 08/02/18   Lindell Spar I, NP  lamoTRIgine (LAMICTAL) 100 MG tablet Take 100 mg by mouth 2 (two) times daily.    [provider]  lamoTRIgine (LAMICTAL) 25 MG tablet Take 2 tablets (50 mg total) by mouth 2 (two) times daily. For mood stabilization Patient not taking: Reported on 08/29/2018 08/02/18   Lindell Spar I, NP  metoprolol succinate (TOPROL-XL) 50 MG 24 hr tablet Take 1 tablet (50 mg total) by mouth daily. Take with or immediately following a meal: For high blood pressure 08/03/18   Nwoko, Herbert Pun I, NP  ondansetron (ZOFRAN) 4 MG tablet Take 1 tablet (4 mg total) by mouth every 6 (six) hours. 11/21/18   Lestine Box, PA-C  predniSONE (STERAPRED UNI-PAK 21 TAB) 10 MG (21) TBPK tablet Take 6 tabs (60mg ) day 1, 5 tabs (50mg ) day 2, 4 tabs (40mg ) day 3, 3 tabs (30mg ) day 4, 2 tabs (20mg ) day 5, and 1 tab (10mg ) day 6. 04/22/19   Ritvik Mczeal C, PA-C  traZODone (DESYREL) 100 MG tablet Take 2.5 tablets (250 mg total) by mouth at bedtime as needed for sleep. 08/02/18   Encarnacion Slates, NP    Family History Family History  Problem Relation Age of Onset  . Stroke Other   . Cancer Other   . Hyperlipidemia Mother   . Hypertension Mother     Social History Social History   Tobacco Use  . Smoking status: Former Research scientist (life sciences)  . Smokeless tobacco: Never Used  . Tobacco comment: smoked on and off 16 years- quit 2016  Substance Use Topics  . Alcohol use: Yes    Comment: 5 bottle of wine daily, last consumption 1 hr PTA  . Drug use: No    Frequency: 1.0 times per week    Types: "Crack" cocaine, Cocaine    Comment: clean 3 weeks     Allergies   Codeine,  Penicillins, Morphine, Coconut flavor, Coconut oil, Grapefruit concentrate, Morphine and related, Oxycodone, and Norco [hydrocodone-acetaminophen]   Review of Systems Review of Systems  Constitutional: Negative for chills, diaphoresis and fever.  HENT: Positive for congestion. Negative for trouble swallowing and voice change.   Respiratory: Positive for cough. Negative for shortness of breath.   Cardiovascular: Negative for chest pain.  Gastrointestinal: Negative for abdominal pain, diarrhea, nausea and vomiting.  Musculoskeletal: Positive for neck pain. Negative for back pain.  Neurological: Negative for dizziness, weakness, light-headedness and headaches.  All other systems reviewed and are negative.    Physical Exam Updated Vital Signs BP 128/83 (BP Location: Right Arm)   Pulse 94   Temp 98.5 F (36.9 C) (Oral)   Resp 18   Ht 5\' 10"  (1.778 m)   Wt 74 kg   SpO2 97%   BMI 23.41 kg/m   Physical Exam Vitals signs and nursing note reviewed.  Constitutional:      General: He is not in acute distress.    Appearance: He is well-developed. He is not diaphoretic.  HENT:     Head: Normocephalic and atraumatic.     Mouth/Throat:     Mouth: Mucous membranes are moist.     Pharynx: Oropharynx is clear.     Comments: No noted area of swelling or fluctuance.  No trismus or noted abnormal phonation.  Mouth opening to at least 3 finger widths.  Handles oral secretions without difficulty.  No noted facial swelling.  No sublingual swelling.  No swelling or tenderness to the submental or submandibular regions.  No swelling or tenderness into the soft tissues of the neck. Eyes:     Conjunctiva/sclera: Conjunctivae normal.  Neck:     Musculoskeletal: Neck supple.     Trachea: Phonation normal.      Comments: There is some tenderness in the anterior neck, as shown.  No noted swelling, color change, thyroid fullness, chest tenderness. Cardiovascular:     Rate and Rhythm: Normal rate and  regular rhythm.     Pulses: Normal pulses.     Heart sounds: Normal heart sounds.  Pulmonary:     Effort: Pulmonary effort is normal. No respiratory distress.     Breath sounds: Normal breath sounds.  Abdominal:     Palpations: Abdomen is soft.  Tenderness: There is no abdominal tenderness. There is no guarding.  Musculoskeletal:     Right lower leg: No edema.     Left lower leg: No edema.  Lymphadenopathy:     Cervical: No cervical adenopathy.  Skin:    General: Skin is warm and dry.  Neurological:     Mental Status: He is alert.  Psychiatric:        Mood and Affect: Mood and affect normal.        Speech: Speech normal.        Behavior: Behavior normal.      ED Treatments / Results  Labs (all labs ordered are listed, but only abnormal results are displayed) Labs Reviewed  NOVEL CORONAVIRUS, NAA (HOSP ORDER, SEND-OUT TO REF LAB; TAT 18-24 HRS)    EKG None  Radiology No results found.  Procedures Procedures (including critical care time)  Medications Ordered in ED Medications  predniSONE (DELTASONE) tablet 60 mg (60 mg Oral Given 04/22/19 0928)  benzonatate (TESSALON) capsule 200 mg (200 mg Oral Given 04/22/19 [id number])     Initial Impression / Assessment and Plan / ED Course  I have reviewed the triage vital signs and the nursing notes.  Pertinent labs & imaging results that were available during my care of the patient were reviewed by me and considered in my medical decision making (see chart for details).        Patient presents with anterior neck pain beginning yesterday. Patient is nontoxic appearing, afebrile, not tachycardic, not tachypneic, not hypotensive, maintains excellent SPO2 on room air, and is in no apparent distress.   Based on location and nonsuggestive physical exam findings, my suspicion for strep pharyngitis is low. We discussed symptomatic treatment.  Send out COVID test pending.  Patient has close PCP follow-up already scheduled. The  patient was given instructions for home care as well as return precautions. Patient voices understanding of these instructions, accepts the plan, and is comfortable with discharge.  [name] was evaluated in Emergency Department on 04/22/2019 for the symptoms described in the history of present illness. He was evaluated in the context of the global COVID-19 pandemic, which necessitated consideration that the patient might be at risk for infection with the SARS-CoV-2 virus that causes COVID-19. Institutional protocols and algorithms that pertain to the evaluation of patients at risk for COVID-19 are in a state of rapid change based on information released by regulatory bodies including the CDC and federal and state organizations. These policies and algorithms were followed during the patient's care in the ED.  Final Clinical Impressions(s) / ED Diagnoses   Final diagnoses:  Throat pain    ED Discharge Orders         Ordered    predniSONE (STERAPRED UNI-PAK 21 TAB) 10 MG (21) TBPK tablet     04/22/19 0939    benzonatate (TESSALON) 100 MG capsule  Every 8 hours     04/22/19 0939           Lorayne Bender, PA-C 04/22/19 1028    Lajean Saver, MD 04/22/19 1330

## 2019-04-23 LAB — NOVEL CORONAVIRUS, NAA (HOSP ORDER, SEND-OUT TO REF LAB; TAT 18-24 HRS): SARS-CoV-2, NAA: NOT DETECTED

## 2019-06-26 ENCOUNTER — Other Ambulatory Visit: Payer: Self-pay

## 2019-06-26 ENCOUNTER — Encounter (HOSPITAL_COMMUNITY): Payer: Self-pay

## 2019-06-26 ENCOUNTER — Ambulatory Visit (HOSPITAL_COMMUNITY)
Admission: EM | Admit: 2019-06-26 | Discharge: 2019-06-26 | Disposition: A | Discharge status: Home / Self Care | Payer: HRSA Program | Attending: Emergency Medicine | Admitting: Emergency Medicine

## 2019-06-26 DIAGNOSIS — U071 COVID-19: Secondary | ICD-10-CM | POA: Diagnosis not present

## 2019-06-26 DIAGNOSIS — R0981 Nasal congestion: Secondary | ICD-10-CM

## 2019-06-26 DIAGNOSIS — Z20822 Contact with and (suspected) exposure to covid-19: Secondary | ICD-10-CM

## 2019-06-26 DIAGNOSIS — R059 Cough, unspecified: Secondary | ICD-10-CM

## 2019-06-26 DIAGNOSIS — R05 Cough: Secondary | ICD-10-CM | POA: Diagnosis not present

## 2019-06-26 DIAGNOSIS — Z720 Tobacco use: Secondary | ICD-10-CM

## 2019-06-26 DIAGNOSIS — Z20828 Contact with and (suspected) exposure to other viral communicable diseases: Secondary | ICD-10-CM | POA: Diagnosis not present

## 2019-06-26 MED ORDER — FLUTICASONE PROPIONATE 50 MCG/ACT NA SUSP: 2.0000 | Freq: Every day | NASAL | 0 refills | Status: DC

## 2019-06-26 NOTE — Discharge Instructions (Addendum)
The Covid PCR test will take 18 to 36 hours for it to come back.  Try some Flonase, saline nasal irrigation with a Milta Deiters med rinse and distilled water as often as you want.

## 2019-06-26 NOTE — ED Provider Notes (Signed)
HPI  SUBJECTIVE:  [name] is a 38 y.o. male who presents for Covid testing.  States that he spent a lot of time in close contact with his brother who tested positive last night.  Last exposure was 2 days ago.  He reports a nonproductive cough but is attributing this to the cold weather and his smoking.  He also reports some nasal congestion.  No fevers, body, headaches, sore throat, loss of sense of smell or taste, shortness of breath, chest pain, wheezing, nausea, vomiting, diarrhea, abdominal pain, postnasal drip, allergy symptoms.  He has tried Hall's cough drops with improvement of symptoms.  No aggravating factors.  He has a past medical history of seizures, hypertension, coronary disease, smoking, peripheral vascular disease, cirrhosis, he is status post left nephrectomy for kidney cancer 5 years ago.  No history of diabetes, pulmonary disease, HIV, current immunocompromise.  States that his remaining kidney "works well".  PMD: [facility]    Past Medical History:  Diagnosis Date  . Angina   . Anxiety    panic attack  . Bipolar 1 disorder ([place])   . Breast CA ([place]) dx'd 2009   bil w/ bil masectomy and oral meds  . Cancer [facility])    kidney cancer  . Coronary artery disease   . Depression   . H/O suicide attempt 2015   overdose  . Headache(784.0)   . Hypercholesteremia   . Hypertension   . Liver cirrhosis ([place])   . Pancreatitis   . Peripheral vascular disease [facility]) April 2011   Left Pop  . Schizophrenia ([place])   . Seizures ([place])    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  . Shortness of breath     Past Surgical History:  Procedure Laterality Date  . BREAST SURGERY    . BREAST SURGERY     bilateral breast silocone  removal  . CHEST SURGERY    . left kidney removal    . left leg surgery     "popiteal artery clogged"  . MASTECTOMY Bilateral   . NEPHRECTOMY Left   . ORIF CLAVICULAR FRACTURE Left 08/10/2017   Procedure: OPEN REDUCTION INTERNAL FIXATION (ORIF)  LEFT CLAVICLE FRACTURE WITH RECONSTRUCTION OF CORACOCLAVICULAR LIGAMENT;  Surgeon: Leandrew Koyanagi, MD;  Location: [place];  Service: Orthopedics;  Laterality: Left;  . RECONSTRUCTION OF CORACOCLAVICULAR LIGAMENT Left 08/10/2017   Procedure: RECONSTRUCTION OF CORACOCLAVICULAR LIGAMENT;  Surgeon: Leandrew Koyanagi, MD;  Location: [place];  Service: Orthopedics;  Laterality: Left;    Family History  Problem Relation Age of Onset  . Stroke Other   . Cancer Other   . Hyperlipidemia Mother   . Hypertension Mother     Social History   Tobacco Use  . Smoking status: Former Research scientist (life sciences)  . Smokeless tobacco: Never Used  . Tobacco comment: smoked on and off 16 years- quit 2016  Substance Use Topics  . Alcohol use: Yes    Comment: 5 bottle of wine daily, last consumption 1 hr PTA  . Drug use: No    Frequency: 1.0 times per week    Types: "Crack" cocaine, Cocaine    Comment: clean 3 weeks    No current facility-administered medications for this encounter.   Current Outpatient Medications:  .  meclizine (ANTIVERT) 25 MG tablet, Take 25 mg by mouth 3 (three) times daily as needed for dizziness., Disp: , Rfl:  .  simvastatin (ZOCOR) 20 MG tablet, Take 20 mg by mouth daily., Disp: , Rfl:  .  amLODipine (NORVASC) 10 MG tablet, Take 1 tablet (10 mg total) by mouth daily. For high blood pressure, Disp: 30 tablet, Rfl: 0 .  busPIRone (BUSPAR) 10 MG tablet, Take 1 tablet (10 mg total) by mouth 3 (three) times daily. For anxiety, Disp: 90 tablet, Rfl: 0 .  chlordiazePOXIDE (LIBRIUM) 25 MG capsule, 50mg  PO TID x 1D, then 25-50mg  PO BID X 1D, then 25-50mg  PO QD X 1D, Disp: 15 capsule, Rfl: 0 .  fluticasone (FLONASE) 50 MCG/ACT nasal spray, Place 2 sprays into both nostrils daily., Disp: 16 g, Rfl: 0 .  hydrochlorothiazide (HYDRODIURIL) 25 MG tablet, Take 25 mg by mouth daily., Disp: , Rfl:  .  hydrOXYzine (ATARAX/VISTARIL) 25 MG tablet, Take 1 tablet (25 mg total) by mouth 3 (three) times daily. For anxiety, Disp: 60  tablet, Rfl: 0 .  ibuprofen (ADVIL,MOTRIN) 600 MG tablet, Take 1 tablet (600 mg total) by mouth every 6 (six) hours as needed for moderate pain. (May buy from over the counter): For mild to moderate pain, Disp: 1 tablet, Rfl: 0 .  lamoTRIgine (LAMICTAL) 100 MG tablet, Take 100 mg by mouth 2 (two) times daily., Disp: , Rfl:  .  lamoTRIgine (LAMICTAL) 25 MG tablet, Take 2 tablets (50 mg total) by mouth 2 (two) times daily. For mood stabilization (Patient not taking: Reported on 08/29/2018), Disp: 120 tablet, Rfl: 0 .  metoprolol succinate (TOPROL-XL) 50 MG 24 hr tablet, Take 1 tablet (50 mg total) by mouth daily. Take with or immediately following a meal: For high blood pressure, Disp: 30 tablet, Rfl: 0 .  ondansetron (ZOFRAN) 4 MG tablet, Take 1 tablet (4 mg total) by mouth every 6 (six) hours., Disp: 12 tablet, Rfl: 0 .  traZODone (DESYREL) 100 MG tablet, Take 2.5 tablets (250 mg total) by mouth at bedtime as needed for sleep., Disp: 90 tablet, Rfl: 0  Allergies  Allergen Reactions  . Codeine Hives, Itching, Swelling and Other (See Comments)    Does not impair breathing, however  . Penicillins Swelling and Other (See Comments)    Has patient had a PCN reaction causing immediate rash, facial/tongue/throat swelling, SOB or lightheadedness with hypotension: Yes Has patient had a PCN reaction causing severe rash involving mucus membranes or skin necrosis: Yes Has patient had a PCN reaction that required hospitalization Yes-ed visit Has patient had a PCN reaction occurring within the last 10 years: Yes If all of the above answers are "NO", then may proceed with Cephalosporin use.   Marland Kitchen Morphine Itching  . Coconut Flavor Itching and Swelling  . Coconut Oil Itching, Swelling and Other (See Comments)    Cannot take with some of his meds (also)  . Grapefruit Concentrate Other (See Comments)    Cannot take with some of his meds  . Morphine And Related Itching and Swelling  . Oxycodone Itching and  Swelling  . Norco [Hydrocodone-Acetaminophen] Itching and Rash     ROS  As noted in HPI.   Physical Exam  BP 107/73 (BP Location: Right Arm)   Pulse 96   Temp 98.7 F (37.1 C) (Oral)   Resp 16   SpO2 96%   Constitutional: Well developed, well nourished, no acute distress Eyes:  EOMI, conjunctiva normal bilaterally HENT: Normocephalic, atraumatic,mucus membranes moist.  Positive erythematous, swollen turbinates.  Positive nasal congestion.  Normal oropharynx, normal tonsils without exudates.  Uvula midline.  No postnasal drip or cobblestoning Respiratory: Normal inspiratory effort, lungs clear bilaterally Cardiovascular: Normal rate regular rhythm no murmurs rubs or  gallops GI: nondistended skin: No rash, skin intact Musculoskeletal: no deformities Neurologic: Alert & oriented x 3, no focal neuro deficits Psychiatric: Speech and behavior appropriate   ED Course   Medications - No data to display  Orders Placed This Encounter  Procedures  . Novel Coronavirus, NAA (Hosp order, Send-out to Ref Lab; TAT 18-24 hrs    Standing Status:   Standing    Number of Occurrences:   1    Order Specific Question:   Is this test for diagnosis or screening    Answer:   Screening    Order Specific Question:   Symptomatic for COVID-19 as defined by CDC    Answer:   No    Order Specific Question:   Hospitalized for COVID-19    Answer:   No    Order Specific Question:   Admitted to ICU for COVID-19    Answer:   No    Order Specific Question:   Previously tested for COVID-19    Answer:   Yes    Order Specific Question:   Resident in a congregate (group) care setting    Answer:   No    Order Specific Question:   Employed in healthcare setting    Answer:   No    No results found for this or any previous visit (from the past 24 hour(s)). No results found.  ED Clinical Impression  1. Close exposure to COVID-19 virus   2. Cough      ED Assessment/Plan  Covid PCR sent.  Home with  Flonase, saline nasal irrigation, Covid work note.   Meds ordered this encounter  Medications  . fluticasone (FLONASE) 50 MCG/ACT nasal spray    Sig: Place 2 sprays into both nostrils daily.    Dispense:  16 g    Refill:  0    *This clinic note was created using [employer]. Therefore, there may be occasional mistakes despite careful proofreading.   ?    Melynda Ripple, MD 06/26/19 1302

## 2019-06-26 NOTE — ED Triage Notes (Signed)
Pt states his brother tested positive for Covid yesterday, they were together 2 days ago. Pt reports having mild cough x 1 day.

## 2019-06-28 LAB — NOVEL CORONAVIRUS, NAA (HOSP ORDER, SEND-OUT TO REF LAB; TAT 18-24 HRS): SARS-CoV-2, NAA: DETECTED — AB

## 2019-06-29 ENCOUNTER — Telehealth (HOSPITAL_COMMUNITY): Payer: Self-pay | Admitting: Emergency Medicine

## 2019-06-29 NOTE — Telephone Encounter (Signed)
Your test for COVID-19 was positive, meaning that you were infected with the novel coronavirus and could give the germ to others.  Please continue isolation at home for at least 10 days since the start of your symptoms. If you do not have symptoms, please isolate at home for 10 days from the day you were tested. Once you complete your 10 day quarantine, you may return to normal activities as long as you've not had a fever for over 24 hours(without taking fever reducing medicine) and your symptoms are improving. Please continue good preventive care measures, including:  frequent hand-washing, avoid touching your face, cover coughs/sneezes, stay out of crowds and keep a 6 foot distance from others.  Go to the nearest hospital emergency room if fever/cough/breathlessness are severe or illness seems like a threat to life.  Attempted to reach patient, father answered and would have him call back to discuss results.

## 2019-06-29 NOTE — Telephone Encounter (Signed)
Patient contacted by phone and made aware of    results. Pt verbalized understanding and had all questions answered.

## 2019-10-13 ENCOUNTER — Ambulatory Visit (HOSPITAL_COMMUNITY)
Admission: EM | Admit: 2019-10-13 | Discharge: 2019-10-13 | Disposition: A | Discharge status: Home / Self Care | Payer: Self-pay

## 2019-10-13 ENCOUNTER — Encounter (HOSPITAL_COMMUNITY): Payer: Self-pay | Admitting: *Deleted

## 2019-10-13 ENCOUNTER — Other Ambulatory Visit: Payer: Self-pay

## 2019-10-13 DIAGNOSIS — L299 Pruritus, unspecified: Secondary | ICD-10-CM

## 2019-10-13 DIAGNOSIS — G40909 Epilepsy, unspecified, not intractable, without status epilepticus: Secondary | ICD-10-CM

## 2019-10-13 DIAGNOSIS — Z760 Encounter for issue of repeat prescription: Secondary | ICD-10-CM

## 2019-10-13 HISTORY — DX: COVID-19: U07.1

## 2019-10-13 MED ORDER — GABAPENTIN 300 MG PO CAPS: 300.0000 mg | ORAL_CAPSULE | Freq: Two times a day (BID) | ORAL | 0 refills | Status: DC

## 2019-10-13 MED ORDER — AMLODIPINE BESYLATE 10 MG PO TABS: 10.0000 mg | ORAL_TABLET | Freq: Every day | ORAL | 0 refills | Status: DC

## 2019-10-13 MED ORDER — GABAPENTIN 600 MG PO TABS: 600.0000 mg | ORAL_TABLET | Freq: Every day | ORAL | 0 refills | Status: DC

## 2019-10-13 MED ORDER — MECLIZINE HCL 25 MG PO TABS: 25.0000 mg | ORAL_TABLET | Freq: Three times a day (TID) | ORAL | 0 refills | Status: DC | PRN

## 2019-10-13 NOTE — ED Triage Notes (Addendum)
Pt states ran out of gabapentin 3 days ago; states his PCP left and he doesn't have appt set with new one until end of April. Also c/o significant generalized pruritis.
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
## 2019-10-23 ENCOUNTER — Telehealth: Payer: Self-pay | Admitting: *Deleted

## 2019-10-23 ENCOUNTER — Ambulatory Visit: Payer: Self-pay

## 2019-10-23 NOTE — Telephone Encounter (Signed)
Call to patient to follow up on missed appointment.  Patient stated that he had called to schedule but no one answered.  Pt also ststed that he does not have his [employer].  Patient spoke with Financial Counselor Deborra Medina today who rescheduled patient's appointment and informed him that he needed to be seen in the Clinics first to establish care and then apply for the [place] .  Patient voiced understanding of and will come in on Friday am for an appointment.  Sander Nephew, RN 10/23/2019 2:48 PM.

## 2019-10-26 ENCOUNTER — Ambulatory Visit: Payer: Self-pay

## 2019-10-30 ENCOUNTER — Other Ambulatory Visit: Payer: Self-pay

## 2019-10-30 ENCOUNTER — Telehealth: Payer: Self-pay

## 2019-10-30 ENCOUNTER — Ambulatory Visit: Payer: Self-pay | Admitting: Internal Medicine

## 2019-10-30 DIAGNOSIS — I10 Essential (primary) hypertension: Secondary | ICD-10-CM

## 2019-10-30 DIAGNOSIS — Z880 Allergy status to penicillin: Secondary | ICD-10-CM

## 2019-10-30 DIAGNOSIS — Z885 Allergy status to narcotic agent status: Secondary | ICD-10-CM

## 2019-10-30 DIAGNOSIS — Z91018 Allergy to other foods: Secondary | ICD-10-CM

## 2019-10-30 DIAGNOSIS — G40909 Epilepsy, unspecified, not intractable, without status epilepticus: Secondary | ICD-10-CM

## 2019-10-30 DIAGNOSIS — E7800 Pure hypercholesterolemia, unspecified: Secondary | ICD-10-CM

## 2019-10-30 DIAGNOSIS — Z720 Tobacco use: Secondary | ICD-10-CM

## 2019-10-30 DIAGNOSIS — L299 Pruritus, unspecified: Secondary | ICD-10-CM

## 2019-10-30 DIAGNOSIS — Z79899 Other long term (current) drug therapy: Secondary | ICD-10-CM

## 2019-10-30 MED ORDER — GABAPENTIN 300 MG PO CAPS: 900.0000 mg | ORAL_CAPSULE | Freq: Two times a day (BID) | ORAL | 0 refills | Status: DC

## 2019-10-30 MED ORDER — GABAPENTIN 600 MG PO TABS: 600.0000 mg | ORAL_TABLET | Freq: Three times a day (TID) | ORAL | 0 refills | Status: DC

## 2019-10-30 MED ORDER — GABAPENTIN 600 MG PO TABS: 600.0000 mg | ORAL_TABLET | Freq: Every day | ORAL | 0 refills | Status: DC

## 2019-10-30 NOTE — Assessment & Plan Note (Addendum)
Patient with a PMH of hyperlipidemia with last lipid profile in 2020 showing elevated triglycerides and LDL.   - Pending orange card approval, will order lipid profile.

## 2019-10-30 NOTE — Patient Instructions (Addendum)
To Mr. Thorne,  It was a pleasure working with you today. Today we discussed your gabapentin, we will do a slow taper to avoid withdrawal from your medications. Pending the results of your orange card we will obtain blood work and send a referral for physical therapy. We will have you follow back in a month's time. I have supplied gabapentin for a month's time in order to continue decreasing the dose. Have a good day! Sincerely,  Maudie Mercury, MD

## 2019-10-30 NOTE — Assessment & Plan Note (Signed)
Patient presenting to the [facility] with hypertension. Currently taking Norvasc 10 mg. Patient does not report side effects.  Plan: - Continue Norvasc 10 mg

## 2019-10-30 NOTE — Telephone Encounter (Signed)
CMA attempted to return call, no answer-no identifiers on machine-HIPPA compliant message left on recorder.Despina Hidden Cassady4/13/20213:07 PM

## 2019-10-30 NOTE — Telephone Encounter (Signed)
Pt states he takes 900mg  2 times daily and 600 at bedtime, he states the script you sent today is not correct
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
## 2019-10-30 NOTE — Telephone Encounter (Signed)
Requesting to speak with a nurse about med, please call pt back.

## 2019-10-30 NOTE — Telephone Encounter (Signed)
Requesting to speak with a nurse about gabapentin (NEURONTIN) 600 MG tablet. Please call pt back.

## 2019-10-31 ENCOUNTER — Encounter: Payer: Self-pay | Admitting: Internal Medicine

## 2019-10-31 NOTE — Telephone Encounter (Signed)
Received call from patient asking if corrected gabapentin RX was sent to pharmacy. Informed patient that 2 separate RX's for Gabapentin were sent to pharmacy yesterday, 300mg  dose to take 3 tabs twice daily and 600mg  dose to take 1 tab in the evening. He verbalized understanding. SChaplin, RN,BSN

## 2019-10-31 NOTE — Assessment & Plan Note (Addendum)
Patient states that he has been on gabapentin 900 BID and 600 nightly for seizure disorder. He ran out of his medications after his PCP moved, and could not get refills. He reports itching, nausea, anxiety, and trouble sleeping.   Assessment:  Patient abruptly discontinued high dose gabapentin for seizures, likely going through withdrawal.   Plan:  - Will start back on home dose with over time.  - Go over thorough medication review with patient at next visit for original dosing of gabapentin.

## 2019-11-05 NOTE — Progress Notes (Signed)
Internal Medicine Clinic Attending  Case discussed with Dr. Winters at the time of the visit.  We reviewed the resident's history and exam and pertinent patient test results.  I agree with the assessment, diagnosis, and plan of care documented in the resident's note.  

## 2019-11-20 ENCOUNTER — Other Ambulatory Visit: Payer: Self-pay | Admitting: Internal Medicine

## 2019-11-21 NOTE — Telephone Encounter (Signed)
Attempted to call pt and suggest that pt have his psychiatrist prescribe the hydroxyzine from now on, lm for rtc. [place] and had a note stating the suggestion placed on refill bag

## 2019-11-26 ENCOUNTER — Encounter: Payer: Self-pay | Admitting: Internal Medicine

## 2019-11-26 ENCOUNTER — Ambulatory Visit: Payer: Self-pay

## 2019-11-26 ENCOUNTER — Other Ambulatory Visit: Payer: Self-pay

## 2019-11-26 ENCOUNTER — Ambulatory Visit (INDEPENDENT_AMBULATORY_CARE_PROVIDER_SITE_OTHER): Payer: Self-pay | Admitting: Internal Medicine

## 2019-11-26 DIAGNOSIS — I10 Essential (primary) hypertension: Secondary | ICD-10-CM

## 2019-11-26 DIAGNOSIS — R569 Unspecified convulsions: Secondary | ICD-10-CM

## 2019-11-26 MED ORDER — GABAPENTIN 600 MG PO TABS: 600.0000 mg | ORAL_TABLET | Freq: Every day | ORAL | 3 refills | Status: DC

## 2019-11-26 MED ORDER — GABAPENTIN 300 MG PO CAPS: 900.0000 mg | ORAL_CAPSULE | Freq: Two times a day (BID) | ORAL | 3 refills | Status: DC

## 2019-11-26 NOTE — Patient Instructions (Signed)
[name],  It was a pleasure working with you again. Today we discussed your gabapentin dosing. Since your gabapentin is used a seizure prophylaxis, we will continue your gabapentin. For your psychiatric medications, please continue to follow up with behavioral health for your medication refills. Once we identify you as a candidate for the orange card, we will begin a work more extensive workup. Please come back within a month.Have a good day! Sincerely,  Maudie Mercury, MD

## 2019-11-28 ENCOUNTER — Encounter: Payer: Self-pay | Admitting: Internal Medicine

## 2019-11-28 NOTE — Assessment & Plan Note (Signed)
BP 111/77 with pulse of 88 on examination today.   P:  Continue amlodipine 10 mg QD.

## 2019-11-28 NOTE — Assessment & Plan Note (Addendum)
After thorough chart review, gabapentin was originally utilized for treatment of his seizures in 2010. Throughout the rest of his documentation, there have been inconsistencies between using gabapentin as mood stabilizers vs seizure prophylaxis. Details were discussed with patient at the time of today's meetings. 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 He additionally endorses using Lamictal for the dual purpose of seizure prophylaxis and bipolar disorder. We will continue to keep patient on gabapentin for seizure prophylaxis.   Plan:  Gabapentin 900 mg BID Gabapentin 600 mg Nightly Lamictal 100 mg BID
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
## 2019-11-29 NOTE — Progress Notes (Signed)
Internal Medicine Clinic Attending  Case discussed with Dr. Winters at the time of the visit.  We reviewed the resident's history and exam and pertinent patient test results.  I agree with the assessment, diagnosis, and plan of care documented in the resident's note.  Dagmar Adcox, M.D., Ph.D.  
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
## 2020-01-07 ENCOUNTER — Encounter: Payer: Self-pay | Admitting: Internal Medicine

## 2020-01-07 ENCOUNTER — Telehealth: Payer: Self-pay

## 2020-01-07 NOTE — Telephone Encounter (Signed)
Patient was a no show to his appointment today.  TC placed, nonidentifying VM obtained and RN unable to leave message. SChaplin, RN,BSN

## 2020-01-12 IMAGING — RF DG C-ARM 61-120 MIN
1 series · 1 of 1 positions shown · non-contrast
Comparison: Left clavicular radiographs-08/05/2017

CLINICAL DATA: ORIF of the left clavicle.

EXAM:
DG C-ARM 61-120 MIN; LEFT CLAVICLE - 2+ VIEWS
FLUOROSCOPY TIME:  59 seconds

[Series 1: run · 1 of 1 slices shown]
[im 1/1]
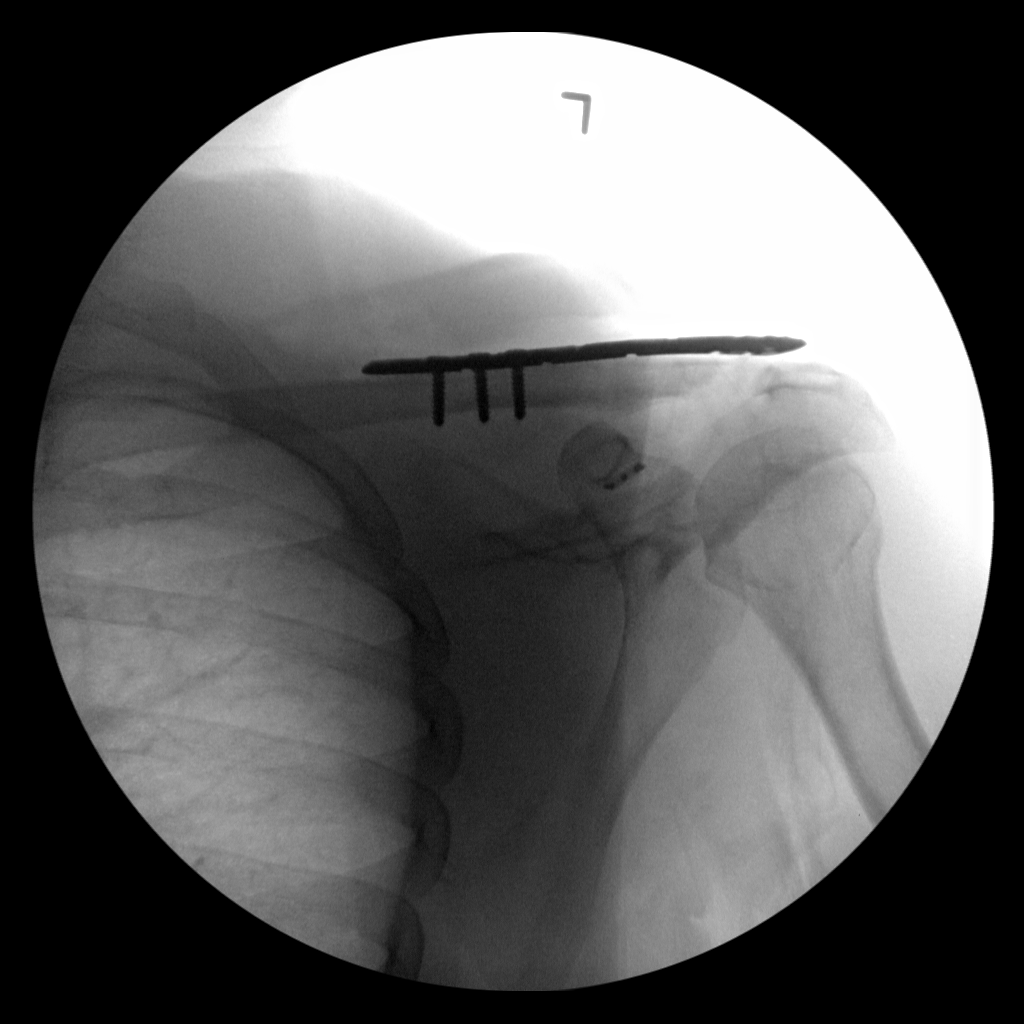

[1 of 1 positions shown; findings below may reference images not displayed]

FINDINGS: A single spot fluoroscopic image of the left clavicle is provided
for review.

Solitary provided image demonstrates the sequela of sideplate
fixation of known comminuted distal left clavicular fracture. The
sideplate is transfixed with 3 cancellous screws about its medial
aspect. Suspected repair of the coracoclavicular ligament with
radiopaque surgical material seen caudal and lateral to the coracoid
process. Alignment appears anatomic given solitary AP projection,
though there is suboptimal visualization of the distal aspect of the
native clavicle. There is a minimal amount of subcutaneous emphysema
about the operative site. No radiopaque foreign body.
IMPRESSION: Post ORIF of the distal end of the left clavicle without evidence of
complication.

## 2020-01-12 IMAGING — DX DG CHEST 1V PORT
1 series · 1 of 1 positions shown · non-contrast
Comparison: 11/26/2015

CLINICAL DATA: ORIF left clavicle

EXAM:
PORTABLE CHEST 1 VIEW

[chest ap]
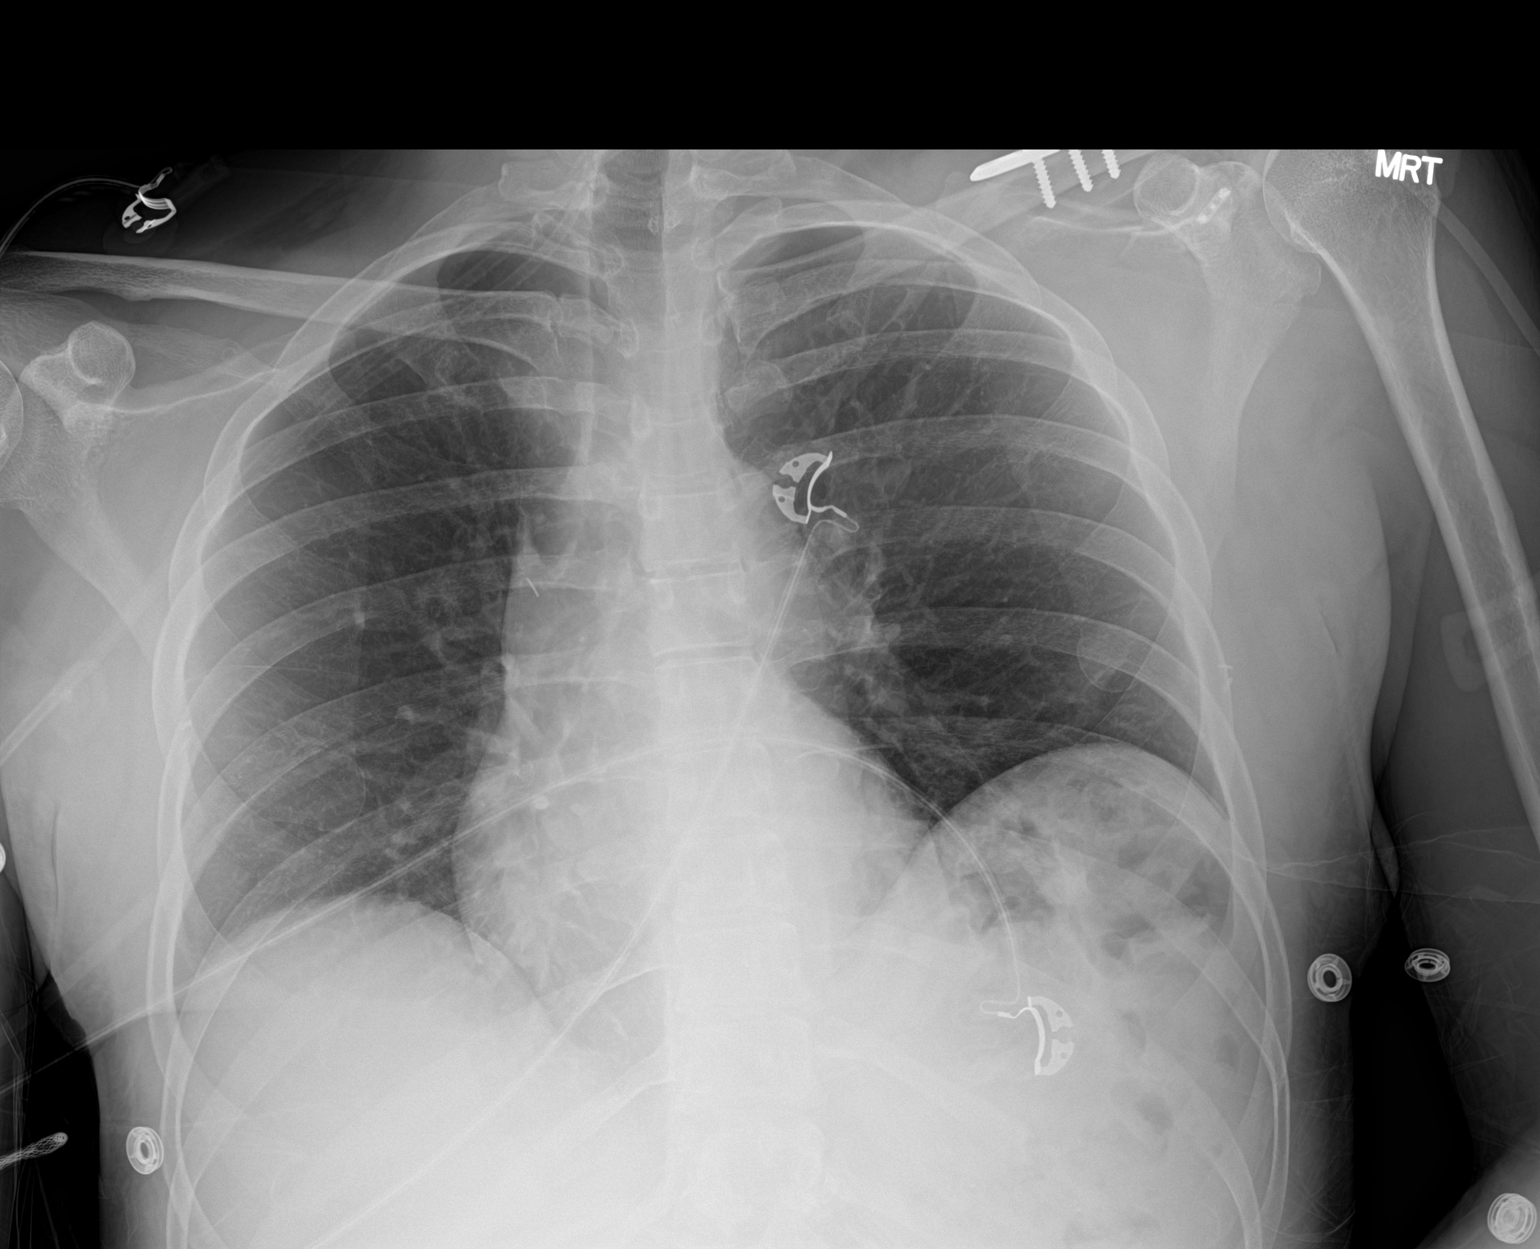

[1 of 1 positions shown; findings below may reference images not displayed]

FINDINGS: Cardiac and mediastinal contours normal. Hypoventilation with mild
bibasilar atelectasis. Negative for heart failure or effusion.

Plate fixation of left clavicle. Three screws are present in the mid
left clavicle with a plate on the superior surface of the clavicle.
The plate extends across the AC joint to the acromion. No screws in
the acromion. There is a button just below the coronoid process.
Resection distal clavicle.
IMPRESSION: Hypoventilation with bibasilar atelectasis

ORIF left clavicle and AC joint.

## 2020-02-18 ENCOUNTER — Other Ambulatory Visit: Payer: Self-pay

## 2020-02-18 ENCOUNTER — Ambulatory Visit (INDEPENDENT_AMBULATORY_CARE_PROVIDER_SITE_OTHER): Payer: No Payment, Other | Admitting: Licensed Clinical Social Worker

## 2020-02-18 ENCOUNTER — Encounter (HOSPITAL_COMMUNITY): Payer: Self-pay | Admitting: Licensed Clinical Social Worker

## 2020-02-18 DIAGNOSIS — F4310 Post-traumatic stress disorder, unspecified: Secondary | ICD-10-CM | POA: Diagnosis not present

## 2020-02-18 DIAGNOSIS — F632 Kleptomania: Secondary | ICD-10-CM

## 2020-02-18 DIAGNOSIS — F3164 Bipolar disorder, current episode mixed, severe, with psychotic features: Secondary | ICD-10-CM | POA: Diagnosis not present

## 2020-02-18 DIAGNOSIS — F411 Generalized anxiety disorder: Secondary | ICD-10-CM
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
## 2020-02-18 NOTE — Patient Instructions (Signed)
Impulse Control Disorders Impulse control disorders are a group of mental health disorders in which a person is unable to control his or her sudden desire to do something (impulse). People who are unable to control impulses repeatedly act without planning or thinking about consequences. A person with an impulse control disorder may:  Have outbursts of anger and argue with authority figures.  Be physically or verbally aggressive toward others.  Regularly break rules or laws. This may include harming people, animals, or property.  Steal, start fires, gamble, or engage in other risky behaviors. Impulse control disorders typically start in the late teenage to early adult years. People with impulse control disorders often have emotional disorders or other forms of mental illness, such as drug abuse or other addiction problems. Over time, impulse disorders may cause problems in relationships and may result in legal problems. What are the causes? The cause of these conditions is not known. What increases the risk? The following factors may make you more likely to develop this condition:  Experiencing abuse or neglect during childhood.  Experiencing physical or emotional trauma during childhood.  Having a parent or sibling with an impulse control disorder.  Having another mental health condition, such as ADHD (attention deficit hyperactivity disorder) or a substance abuse disorder. What are the signs or symptoms? Unlike people with other mental health, substance abuse, or general medical conditions, people with impulse control disorders cannot keep from acting on their impulses. They may:  Have trouble controlling emotions, especially anger.  Feel like they must act on an impulse when they experience strong emotions or stress.  Have specific impulsive behaviors that relieve tension, such as setting a fire or stealing.  Have anxiety, tension, or excitement before or during the impulsive  behavior.  Feel relief or pleasure while acting on the impulse, or after acting on it.  Act without regard for the safety of themselves or others.  Act without planning ahead.  Feel regret or guilt after acting on an impulse. Symptoms of impulse control disorders differ based on the specific disorder. How is this diagnosed? These disorders are diagnosed through an assessment by your health care provider. Your health care provider will ask questions about:  Your impulsive behavior.  Your moods.  Your thoughts.  Past and recent life events.  Your medical history.  Your use of alcohol or drugs, including prescription medicines. Certain medical conditions, other mental illnesses, and certain substances can cause symptoms similar to impulse control disorders. You may be referred to a mental health specialist. How is this treated? Impulse control disorders may be treated with a combination of the following treatments:  Cognitive behavioral therapy (CBT). This is a form of talk therapy. It focuses on reducing negative beliefs and thoughts related to impulsive behavior and replacing them with healthier thoughts and behaviors. This may be done individually or in a group setting.  Medicines.  Family intervention. This is a program that educates family members about your disorder. It teaches healthy communication and problem-solving skills, and it helps family members support you. Follow these instructions at home:   Take over-the-counter and prescription medicines only as told by your health care provider. Check with your health care provider before starting any new prescription or over-the-counter medicines.  Keep all follow-up visits as told by your health care providers or therapists. This is important. This includes going to therapy or family intervention as directed.  Find a support group to talk with peers about managing stress and impulses.  Find  healthy ways to recognize  emotions and manage stress, such as: ? Journaling. ? Exercise. ? Deep breathing, yoga, or meditation. Contact a health care provider if:  You are not able to take your medicines as prescribed.  Your symptoms get worse. Get help right away if:  You think about hurting yourself or others. If you ever feel like you may hurt yourself or others, or have thoughts about taking your own life, get help right away. You can go to your nearest emergency department or call:  Your local emergency services (911 in the [place]  A suicide crisis helpline, such as the [place] at 934-616-5513. This is open 24 hours a day. Summary  Impulse control disorders are a group of mental health disorders in which a person is unable to control his or her sudden desire to do something (impulse).  People with these disorders act impulsively through certain behaviors in order to feel relief from stress or emotional tension.  Treatment may include cognitive behavioral therapy (CBT), medicines, family intervention, or a combination of these. This information is not intended to replace advice given to you by your health care provider. Make sure you discuss any questions you have with your health care provider. Document Revised: 06/17/2017 Document Reviewed: 07/27/2016 Elsevier Patient Education  Hilo Stress Disorder, Adult Post-traumatic stress disorder (PTSD) is a mental health disorder that can occur after a traumatic event, such as a threat to life, serious injury, or sexual violence. Sometimes, PTSD can occur in people who hear about trauma that occurs to a close family member or friend. PTSD can happen to anyone at any age. What are the causes? The condition may be caused by experiencing a traumatic event. What increases the risk? This condition is more likely to occur in:  Engineer, manufacturing.  People who are in circumstances where  their lives are threatened.  People who have been the victim of, or witness to, a traumatic event, such as: ? Domestic violence. ? Physical or sexual abuse. ? Rape. ? A terrorist act or gun violence. ? Natural disasters. ? Accidents involving serious injury. What are the signs or symptoms? PTSD symptoms may start soon after a frightening event or months or years later. Symptoms last at least one month and tend to disrupt relationships, work, and daily activities. Symptoms of PTSD can be grouped into several categories. Intrusive symptoms This is when you re-experience the physical and emotional sensations of the traumatic event through one or more of the following ways:  Having upsetting dreams.  Feeling fear, horror, intense sadness, or anger in response to a reminder of the trauma.  Having unwanted upsetting memories while awake.  Having physical reactions triggered by reminders of the trauma, such as increased heart rate, shortness of breath, sweating, and shaking.  Having flashbacks, or feeling like you are going through the event again. Avoidance symptoms This is when you avoid anything that reminds you of the trauma. Symptoms may also include:  Losing interest or not participating in daily activities.  Feeling disconnected from or avoiding other people.  Isolating yourself. Increased arousal symptoms You may have physical or emotional reactions triggered by your environment. Symptoms may include:  Being easily startled.  Behaving in a careless or self-destructive way.  Becoming easily irritated.  Feeling worried and nervous.  Having trouble concentrating.  Yelling at or hitting other people or objects.  Having trouble sleeping. Negative mood and thoughts  Believing that you or  others are bad.  Feeling fear, horror, anger, sadness, guilt, or shame regularly.  Not being able to remember certain parts of the traumatic event.  Blaming yourself or others for  the trauma.  Being unable to experience positive emotions, such as happiness or love. How is this diagnosed? PTSD is diagnosed through an assessment by a mental health professional. Dennis Bast will be asked questions about your symptoms. How is this treated? Treatment for this condition may include any of the following or a combination:  Taking medicines to reduce PTSD symptoms.  Having counseling with a mental health professional or therapist who is experienced in treating PTSD.  Doing eye movement desensitization and reprocessing therapy (EMDR). This type of therapy occurs with a specialized therapist. If you have other mental health concerns, these conditions will also be treated. Follow these instructions at home: Lifestyle  Find a support group in your community. Groups are often available for [employer] veterans, trauma victims, and family members or caregivers.  Try to get 7-9 hours of sleep each night. To help with sleep: ? Keep your bedroom cool and dark. ? Do not eat a heavy meal within 1 hour of bedtime. ? Do not drink alcohol or caffeinated drinks before bed. ? Avoid screen time, such as television, computers, tablets, or mobile phones, before bed.  Do not use illegal drugs.  Contact a local organization to find out if you are eligible for a service dog. Activity  Exercise regularly. Try to do at least 30 minutes of physical activity most days of the week.  Practice self-calming through: ? Breathing exercises. ? Meditation. ? Yoga. ? Listening to quiet music.  Do not isolate yourself. Make connections with other people.  Consider volunteering. Volunteering can help you feel more connected. Eating and drinking  Do not drink alcohol if: ? Your health care provider tells you not to drink. ? You are pregnant, may be pregnant, or are planning to become pregnant.  If you drink alcohol: ? Limit how much you use to:  0-1 drink a day for women.  0-2 drinks a day for  men. ? Be aware of how much alcohol is in your drink. In the [place] one drink equals one 12 oz bottle of beer (355 mL), one 5 oz glass of wine (148 mL), or one 1 oz glass of hard liquor (44 mL). General instructions  Take steps to help yourself feel safer at home, such as by installing a security system.  Work with a health care provider or therapist to help manage your symptoms.  Take over-the-counter and prescription medicines as told by your health care provider.  Let others know that you have PTSD and the things that may trigger symptoms. This can protect you and help them understand you better.  If your PTSD is affecting your marriage or family, seek help from a family therapist.  Make sure to let all of your health care providers know you have PTSD. This is especially important if you are having surgery or need to be admitted to the hospital.  Keep all follow-up visits as told by your health care provider. This is important. Contact a health care provider if:  Your symptoms do not get better.  You are feeling overwhelmed by your symptoms. Get help right away if:  You have thoughts of hurting yourself or others. If you ever feel like you may hurt yourself or others, or have thoughts about taking your own life, get help right away. You can go to  your nearest emergency department or call:  Your local emergency services (911 in the [place]  A suicide crisis helpline, such as the [facility] at 857-563-8282. This is open 24 hours a day. Summary  Post-traumatic stress disorder (PTSD) is a mental health disorder that can occur after a traumatic event.  Treatment for PTSD may include medicines, counseling, eye movement desensitization and reprocessing therapy (EMDR), or a combination of therapies.  Find a support group in your community.  Get help right away if you have thoughts of hurting yourself or others. This information is not intended to replace  advice given to you by your health care provider. Make sure you discuss any questions you have with your health care provider. Document Revised: 09/14/2018 Document Reviewed: 09/14/2018 Elsevier Patient Education  [place].

## 2020-03-13 ENCOUNTER — Ambulatory Visit (HOSPITAL_COMMUNITY): Payer: Self-pay | Admitting: Psychiatry

## 2020-03-19 ENCOUNTER — Encounter: Payer: Self-pay | Admitting: Internal Medicine

## 2020-03-22 IMAGING — CT CT HEAD W/O CM
3 of 4 series · 15 of 47 positions shown, 18 images · non-contrast
Comparison: 07/25/2017

CLINICAL DATA: Pt attempted suicide today by drinking two bottles
of Listerine. Pt states he had seizure yesterday due to alcohol
withdrawal and hit head.

EXAM:
CT HEAD WITHOUT CONTRAST
TECHNIQUE: Contiguous axial images were obtained from the base of the skull
through the vertex without intravenous contrast.

[Series 4: head 2.0 h70h · axial · 0.42mm/px · z∈[-97,+29]mm · 9 of 79 slices shown, 12 images]
[im 8/79  brain]
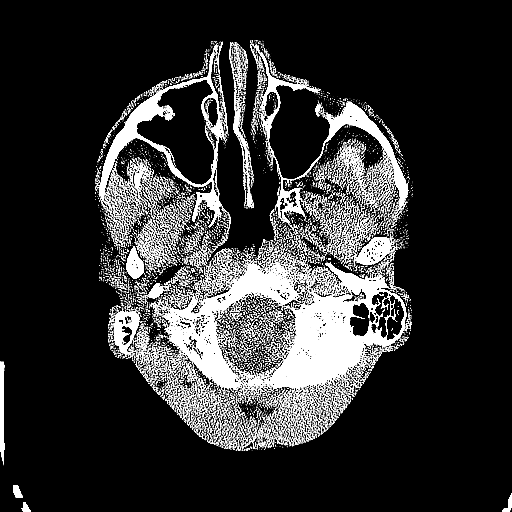
[im 8/79  bone]
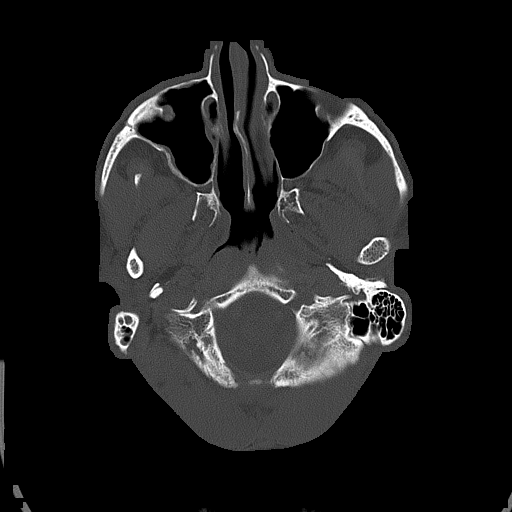
[im 16/79  brain]
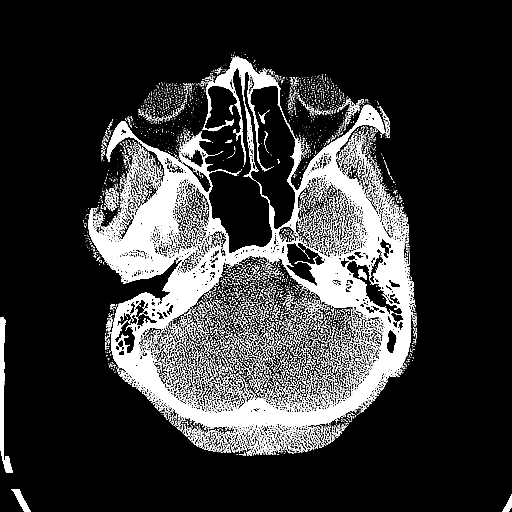
[im 24/79  brain]
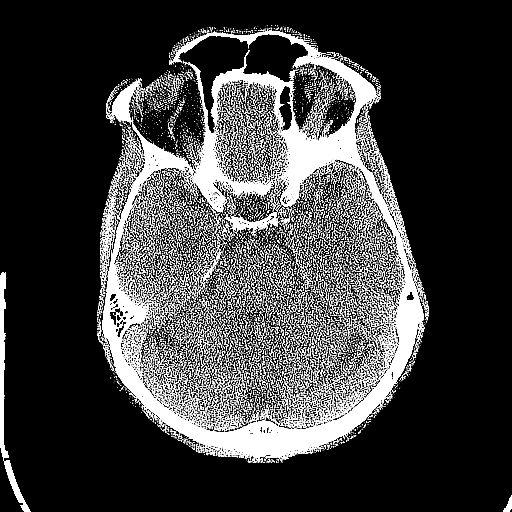
[im 32/79  brain]
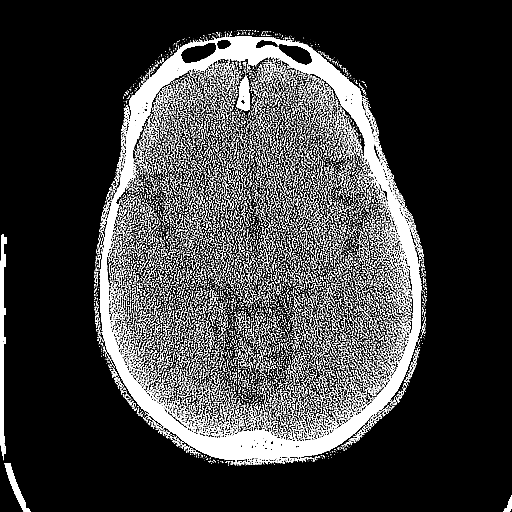
[im 40/79  brain]
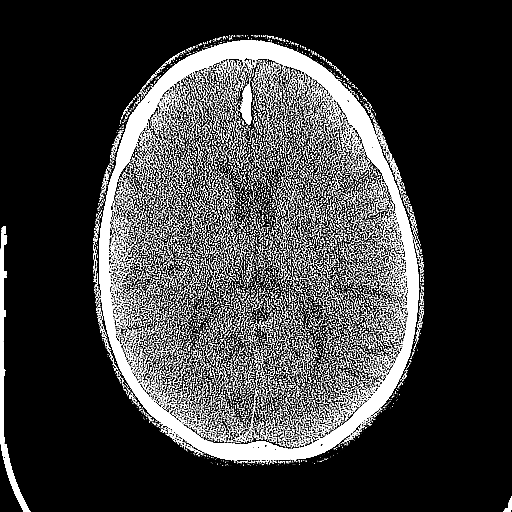
[im 40/79  bone]
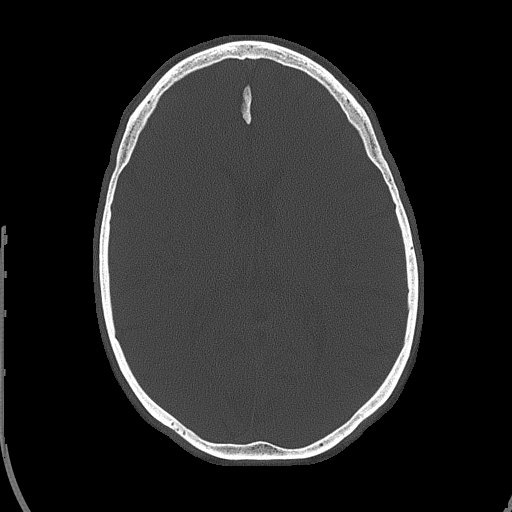
[im 47/79  brain]
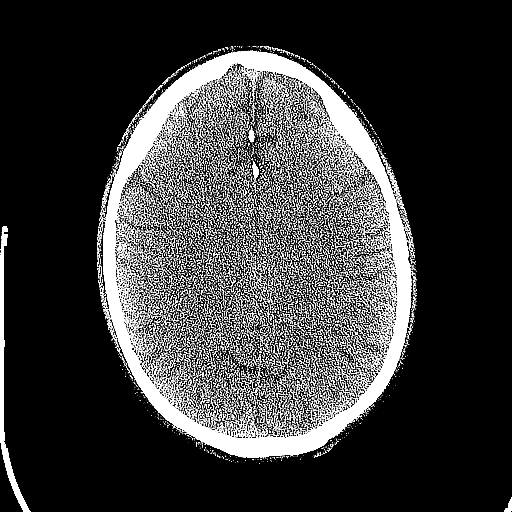
[im 55/79  brain]
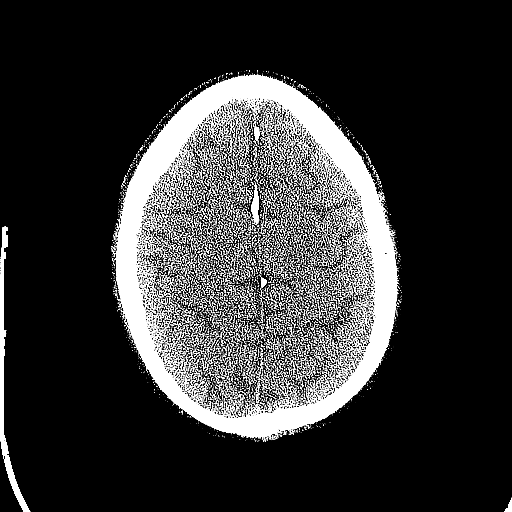
[im 63/79  brain]
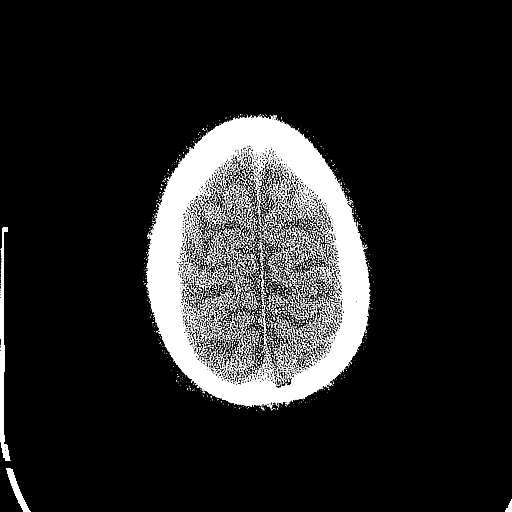
[im 71/79  brain]
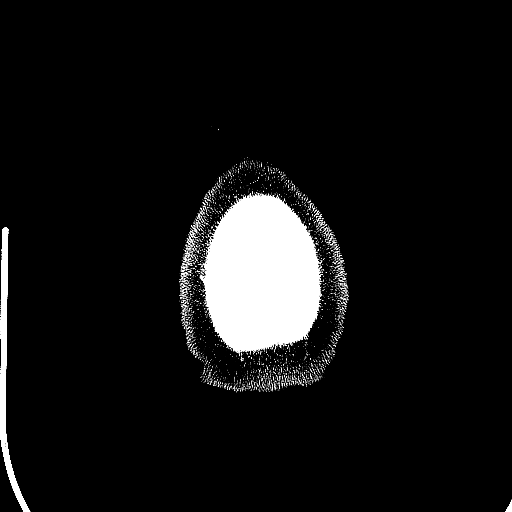
[im 71/79  bone]
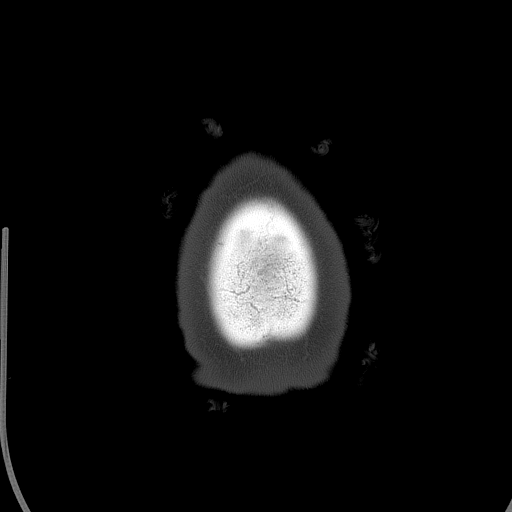

[Series 5: head 3.0 mpr cor · coronal · 0.30mm/px · 3 of 67 slices shown]
[im 23/67  brain]
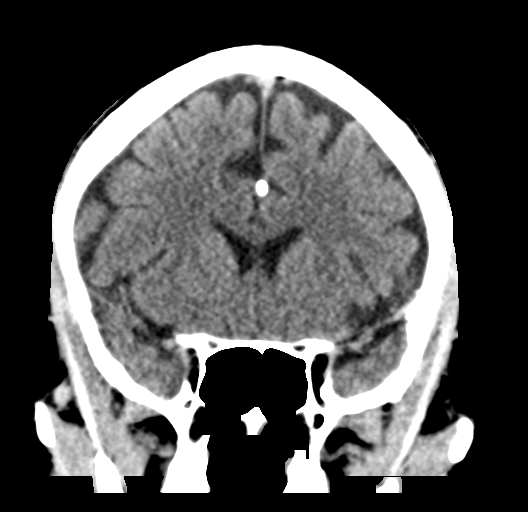
[im 30/67  brain]
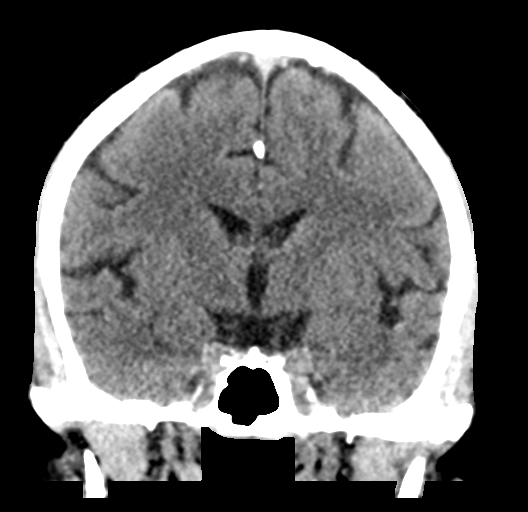
[im 37/67  brain]
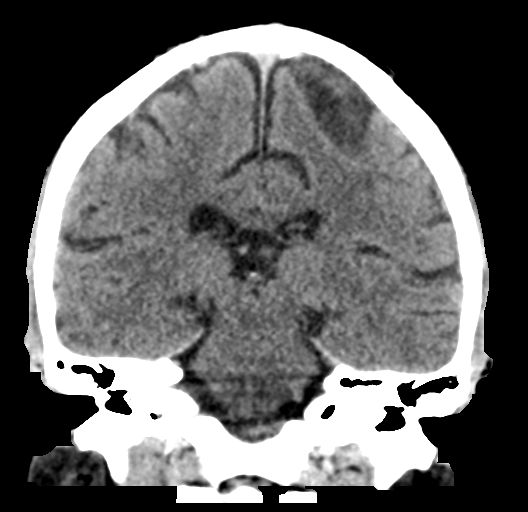

[Series 6: head 3.0 mpr sag · sagittal · 0.30mm/px · 3 of 52 slices shown]
[im 18/52  brain]
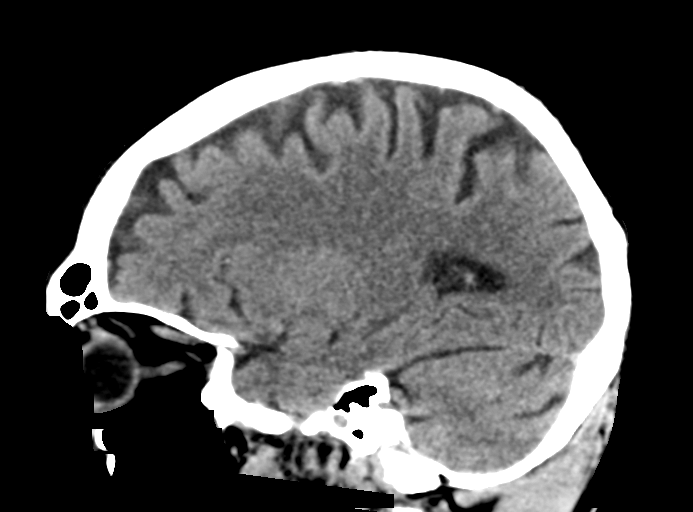
[im 26/52  brain]
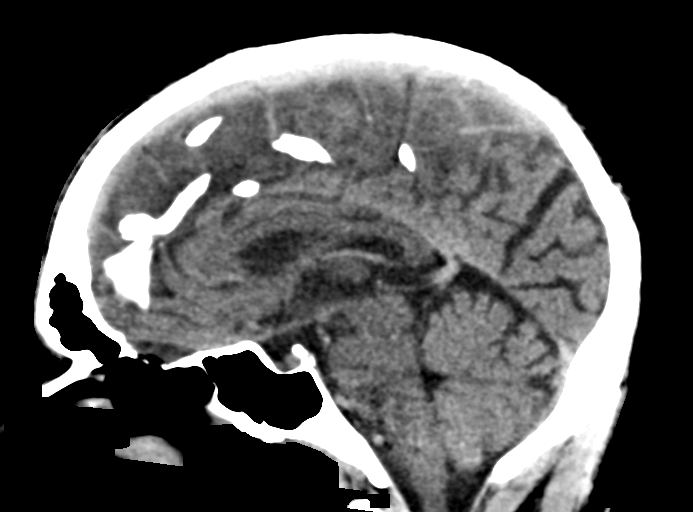
[im 35/52  brain]
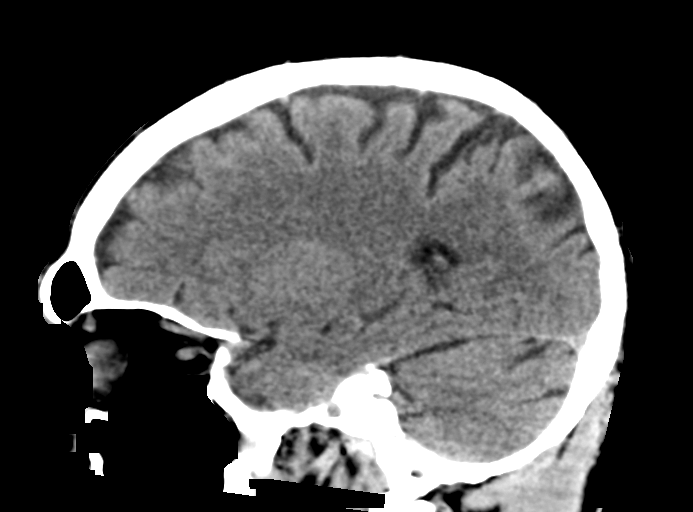

[15 of 47 positions shown; findings below may reference images not displayed]

FINDINGS: Brain: Mild cerebral atrophy. No ventricular dilatation. No mass
effect or midline shift. No abnormal extra-axial fluid collections.
Gray-white matter junctions are distinct. Basal cisterns are not
effaced. No acute intracranial hemorrhage.

Vascular: No hyperdense vessel or unexpected calcification.

Skull: Normal. Negative for fracture or focal lesion.

Sinuses/Orbits: Mucosal thickening in the right maxillary antrum. No
acute air-fluid levels. Mastoid air cells are clear.

Other: None.
IMPRESSION: No acute intracranial abnormalities.  Mild cerebral atrophy for age.

## 2020-03-23 IMAGING — CT CT ABD-PELV W/ CM
2 of 4 series · 16 of 46 positions shown, 18 images · IV contrast (iopamidol)
Comparison: 07/25/2017

CLINICAL DATA: Pt c/o abd pain and vomiting, drank 2 bottles of
listerine and [DATE] gal. alcohol Pt admits self harm/alcoholism Hx of
kidney CA w/left nephrectomy Hx of breast CA 80 ml Iso 300 Unable to
do scan due to vomiting until RN came to give meds

EXAM:
CT ABDOMEN AND PELVIS WITH CONTRAST
TECHNIQUE: Multidetector CT imaging of the abdomen and pelvis was performed
using the standard protocol following bolus administration of
intravenous contrast.
CONTRAST:  80mL A15R8K-OSS IOPAMIDOL (A15R8K-OSS) INJECTION 61%

[Series 5: coronal soft tissue · coronal · 0.78mm/px · 3 of 72 slices shown]
[im 24/72  soft-tissue]
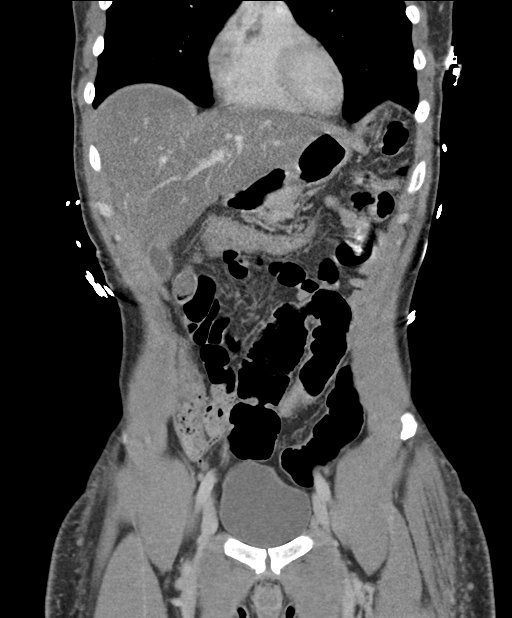
[im 32/72  soft-tissue]
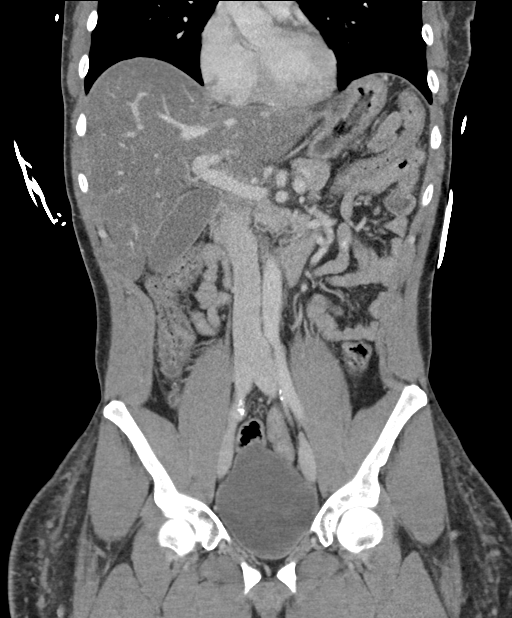
[im 40/72  soft-tissue]
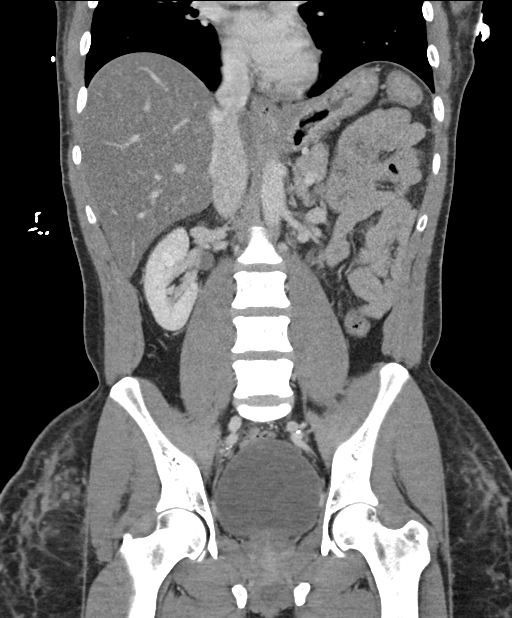

[Series 7: abd/ pelvis 5.0 i30f 2 · axial · 0.74mm/px · z∈[+858,+1298]mm · 13 of 97 slices shown, 15 images]
[im 5/97  soft-tissue]
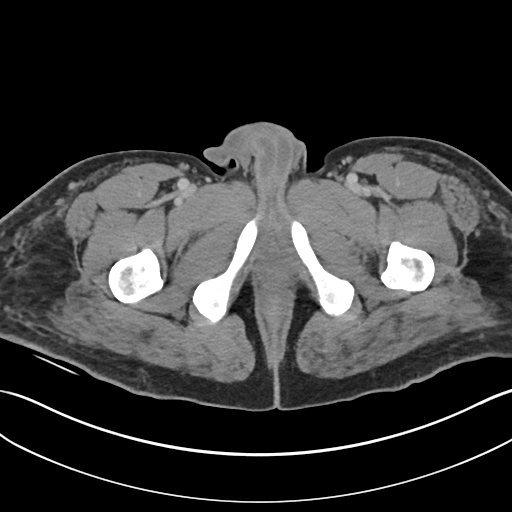
[im 5/97  bone]
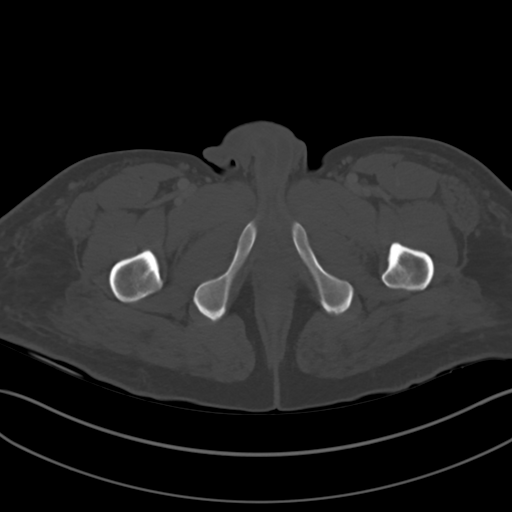
[im 13/97  soft-tissue]
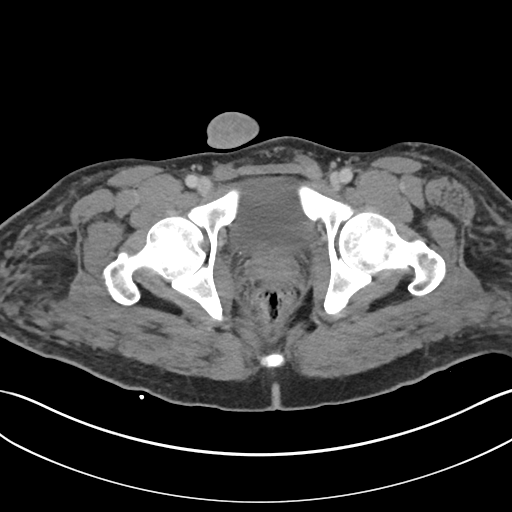
[im 21/97  soft-tissue]
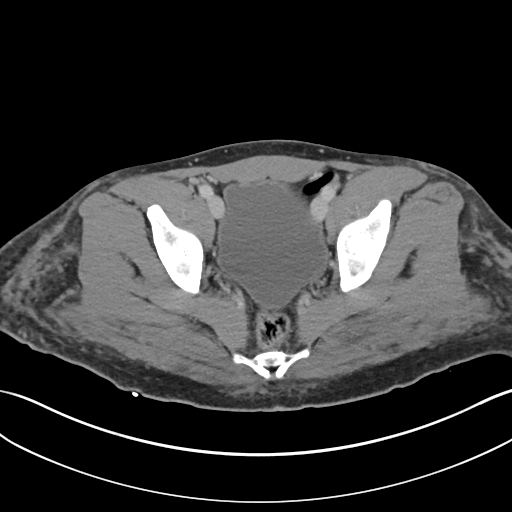
[im 29/97  soft-tissue]
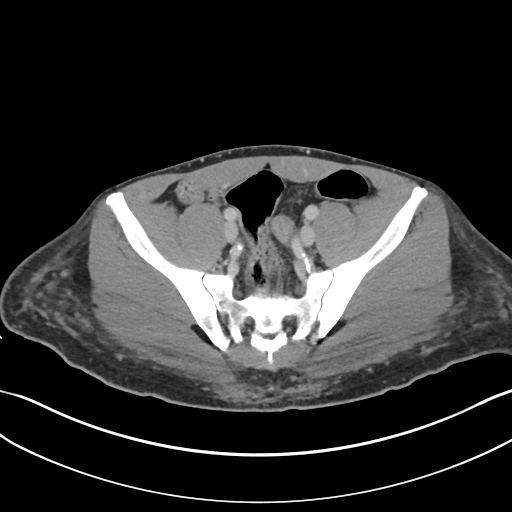
[im 33/97  soft-tissue]
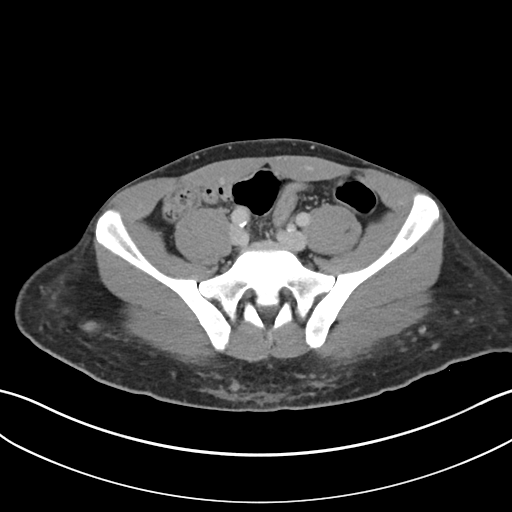
[im 41/97  soft-tissue]
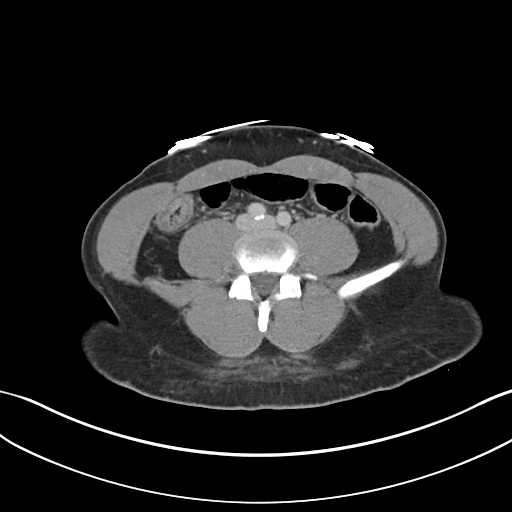
[im 49/97  soft-tissue]
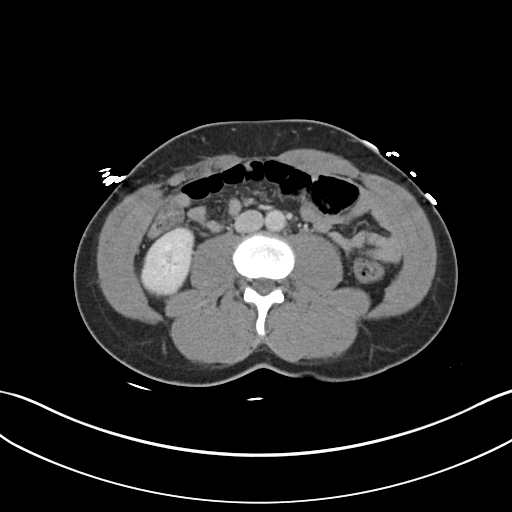
[im 57/97  soft-tissue]
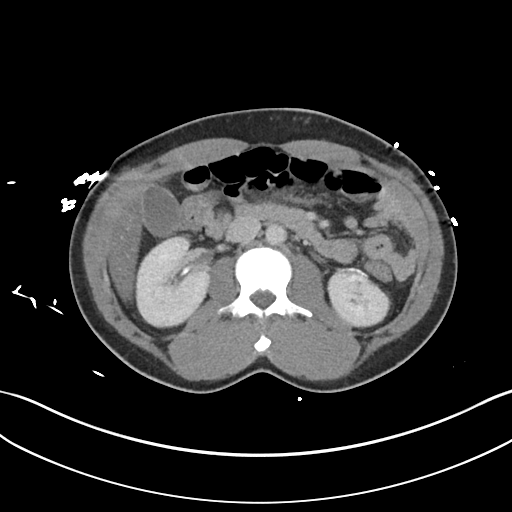
[im 65/97  soft-tissue]
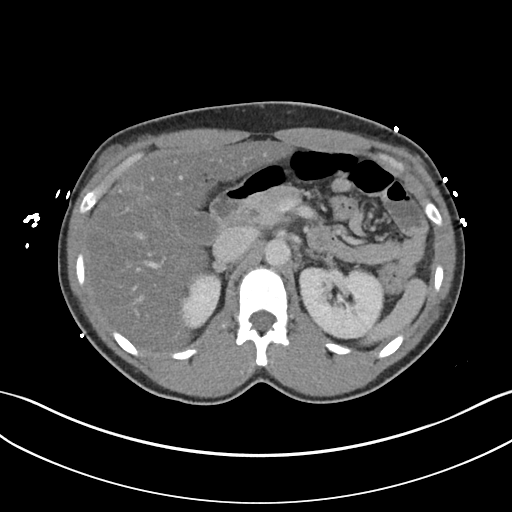
[im 65/97  bone]
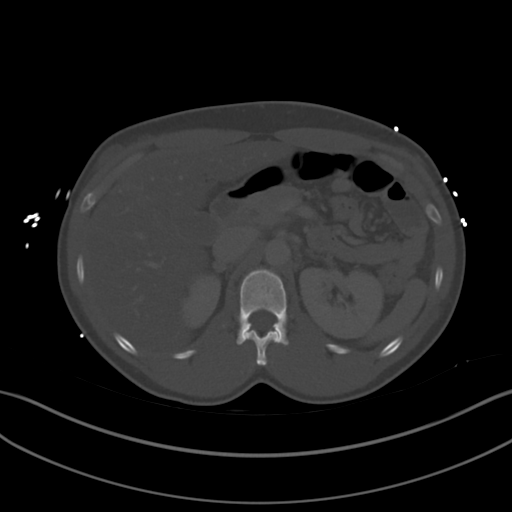
[im 69/97  soft-tissue]
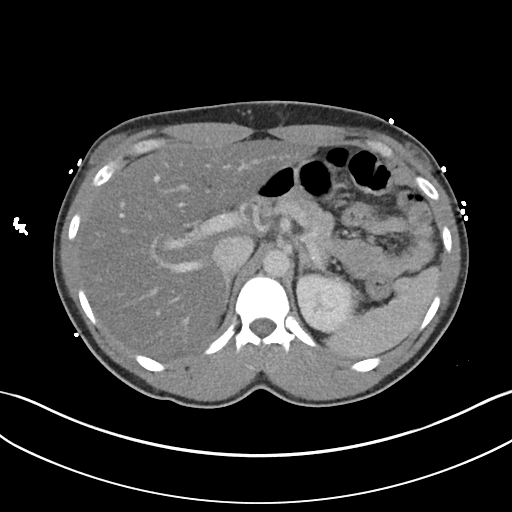
[im 77/97  soft-tissue]
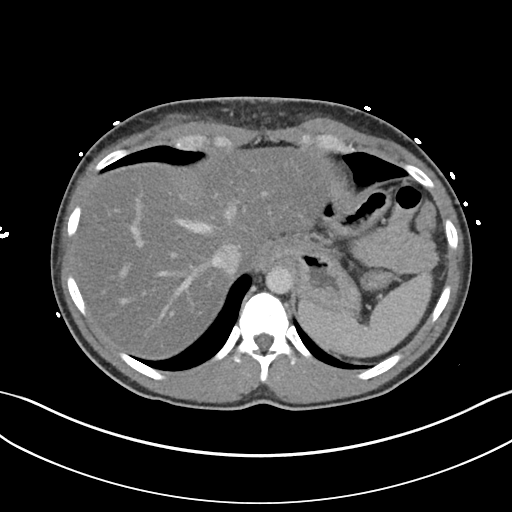
[im 85/97  soft-tissue]
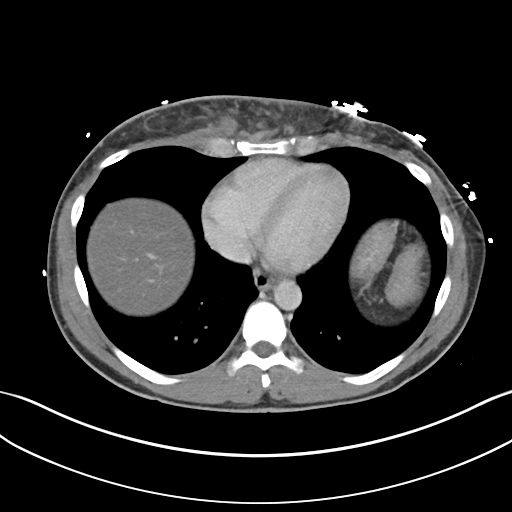
[im 93/97  soft-tissue]
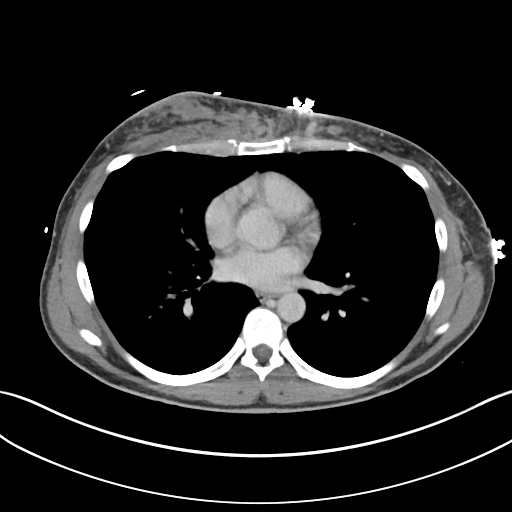

[16 of 46 positions shown; findings below may reference images not displayed]

FINDINGS: Lower chest: Asymmetric nodular soft tissue densities in the
subcutaneous tissues of the anterior chest wall, right greater than
left, probably slightly progressive since prior study. Visualized
lung bases clear. No pleural or pericardial effusion.

Hepatobiliary: Fatty liver without focal lesion. Unremarkable
gallbladder.

Pancreas: Unremarkable. No pancreatic ductal dilatation or
surrounding inflammatory changes.

Spleen: Normal in size without focal abnormality.

Adrenals/Urinary Tract: Normal adrenal glands. Stable postop changes
at the anterior margin of the lower pole left kidney. Right kidney
unremarkable. No hydronephrosis. Urinary bladder physiologically
distended.

Stomach/Bowel: Stomach is within normal limits. Appendix appears
normal. No evidence of bowel wall thickening, distention, or
inflammatory changes.

Vascular/Lymphatic: Minimal scattered calcified aortoiliac arterial
plaque. Portal vein patent. No enlarged abdominal or pelvic lymph
nodes.

Reproductive: Uterus and bilateral adnexa are unremarkable.

Other: No ascites.  No free air.

Musculoskeletal: Small scattered subcutaneous nodules in the gluteal
and buttock regions bilaterally, increased since prior study,
presumably related to subcutaneous injections. No fracture or
worrisome bone lesion.
IMPRESSION: 1. No acute abdominal process.
2. Fatty liver
3. Worsening asymmetric nodular soft tissue densities in the
anterior chest wall, right greater than left.

## 2020-03-25 ENCOUNTER — Other Ambulatory Visit: Payer: Self-pay | Admitting: Internal Medicine

## 2020-03-25 ENCOUNTER — Ambulatory Visit (INDEPENDENT_AMBULATORY_CARE_PROVIDER_SITE_OTHER): Payer: Self-pay | Admitting: Internal Medicine

## 2020-03-25 ENCOUNTER — Other Ambulatory Visit: Payer: Self-pay

## 2020-03-25 VITALS — BP 117/80 | HR 84 | Temp 98.4°F | Wt 162.9 lb

## 2020-03-25 DIAGNOSIS — G893 Neoplasm related pain (acute) (chronic): Secondary | ICD-10-CM

## 2020-03-25 DIAGNOSIS — G8929 Other chronic pain: Secondary | ICD-10-CM

## 2020-03-25 DIAGNOSIS — G8921 Chronic pain due to trauma: Secondary | ICD-10-CM

## 2020-03-25 DIAGNOSIS — K029 Dental caries, unspecified: Secondary | ICD-10-CM

## 2020-03-25 DIAGNOSIS — E7800 Pure hypercholesterolemia, unspecified: Secondary | ICD-10-CM

## 2020-03-25 MED ORDER — GABAPENTIN 600 MG PO TABS: 600.0000 mg | ORAL_TABLET | Freq: Every day | ORAL | 3 refills | Status: DC

## 2020-03-25 MED ORDER — GABAPENTIN 300 MG PO CAPS: 900.0000 mg | ORAL_CAPSULE | Freq: Two times a day (BID) | ORAL | 3 refills | Status: DC

## 2020-03-25 NOTE — Assessment & Plan Note (Addendum)
PMH of hyperlipidemia and not currently on a statin. He has been approved for the orange card which will help pay for lab work.  We will check his lipid panel today.  Plan: - Lipid Profile - CMP14 + Anion Gap  Addendum:  ASCVD risk 9 %. Discussed with patient and will start moderate intensity statin. Rosuvastatin. Recheck lipid panel in 6 weeks looking for 30% reduction in LDL.
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
## 2020-03-26 ENCOUNTER — Encounter: Payer: Self-pay | Admitting: Internal Medicine

## 2020-03-26 DIAGNOSIS — G8929 Other chronic pain: Secondary | ICD-10-CM | POA: Insufficient documentation

## 2020-03-26 LAB — CMP14 + ANION GAP
ALT: 12 IU/L (ref 0–44)
AST: 21 IU/L (ref 0–40)
Albumin/Globulin Ratio: 1.8 (ref 1.2–2.2)
Albumin: 4.2 g/dL (ref 4.0–5.0)
Alkaline Phosphatase: 52 IU/L (ref 48–121)
Anion Gap: 14 mmol/L (ref 10.0–18.0)
BUN/Creatinine Ratio: 11 (ref 9–20)
BUN: 10 mg/dL (ref 6–24)
Bilirubin Total: <0.2 mg/dL (ref 0.0–1.2)
CO2: 23 mmol/L (ref 20–29)
Calcium: 9.5 mg/dL (ref 8.7–10.2)
Chloride: 102 mmol/L (ref 96–106)
Creatinine, Ser: 0.95 mg/dL (ref 0.76–1.27)
GFR calc Af Amer: 115 mL/min/{1.73_m2} (ref >59)
GFR calc non Af Amer: 100 mL/min/{1.73_m2} (ref >59)
Globulin, Total: 2.3 g/dL (ref 1.5–4.5)
Glucose: 100 mg/dL — ABNORMAL HIGH (ref 65–99)
Potassium: 4.1 mmol/L (ref 3.5–5.2)
Sodium: 139 mmol/L (ref 134–144)
Total Protein: 6.5 g/dL (ref 6.0–8.5)

## 2020-03-26 LAB — LIPID PANEL
Chol/HDL Ratio: 7.7 ratio — ABNORMAL HIGH (ref 0.0–5.0)
Cholesterol, Total: 338 mg/dL — ABNORMAL HIGH (ref 100–199)
HDL: 44 mg/dL (ref >39)
LDL Chol Calc (NIH): 286 mg/dL — ABNORMAL HIGH (ref 0–99)
Triglycerides: 64 mg/dL (ref 0–149)
VLDL Cholesterol Cal: 8 mg/dL (ref 5–40)

## 2020-03-26 MED ORDER — ROSUVASTATIN CALCIUM 10 MG PO TABS: 10.0000 mg | ORAL_TABLET | Freq: Every day | ORAL | 11 refills | Status: DC

## 2020-03-26 NOTE — Addendum Note (Signed)
Addended by: Lyndal Pulley on: 03/26/2020 04:29 PM   Modules accepted: Orders

## 2020-03-26 NOTE — Progress Notes (Signed)
Patient aware.
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
## 2020-03-26 NOTE — Progress Notes (Signed)
Internal Medicine Clinic Attending  Case discussed with Dr. Steen  At the time of the visit.  We reviewed the resident's history and exam and pertinent patient test results.  I agree with the assessment, diagnosis, and plan of care documented in the resident's note.  

## 2020-03-27 ENCOUNTER — Ambulatory Visit (HOSPITAL_COMMUNITY): Payer: Self-pay | Admitting: Licensed Clinical Social Worker

## 2020-03-28 NOTE — Addendum Note (Signed)
Addended by: Marcelino Duster on: 03/28/2020 12:52 PM   Modules accepted: Orders

## 2020-03-30 ENCOUNTER — Telehealth (HOSPITAL_COMMUNITY): Payer: Self-pay | Admitting: Adult Health

## 2020-04-02 ENCOUNTER — Telehealth: Payer: Self-pay | Admitting: *Deleted

## 2020-04-02 NOTE — Telephone Encounter (Signed)
SPOKE WITH PATIENT REGARDING PT REFERRAL. PATIENT HE IS UNABLE TO PAY OUT OF POCKET. INSTRUCTED PATIENT CALL [facility] AND SPEAK WITH OUR FC.   [name] IS AWARE PATIENT IS GOING TO CALL HER.

## 2020-04-09 ENCOUNTER — Ambulatory Visit: Payer: Self-pay

## 2020-04-09 ENCOUNTER — Other Ambulatory Visit: Payer: Self-pay

## 2020-04-25 IMAGING — CR DG ABDOMEN ACUTE W/ 1V CHEST
3 series · 3 of 3 positions shown · non-contrast
Comparison: None.

CLINICAL DATA: Overdose today.

EXAM:
DG ABDOMEN ACUTE W/ 1V CHEST

[w chest pa]
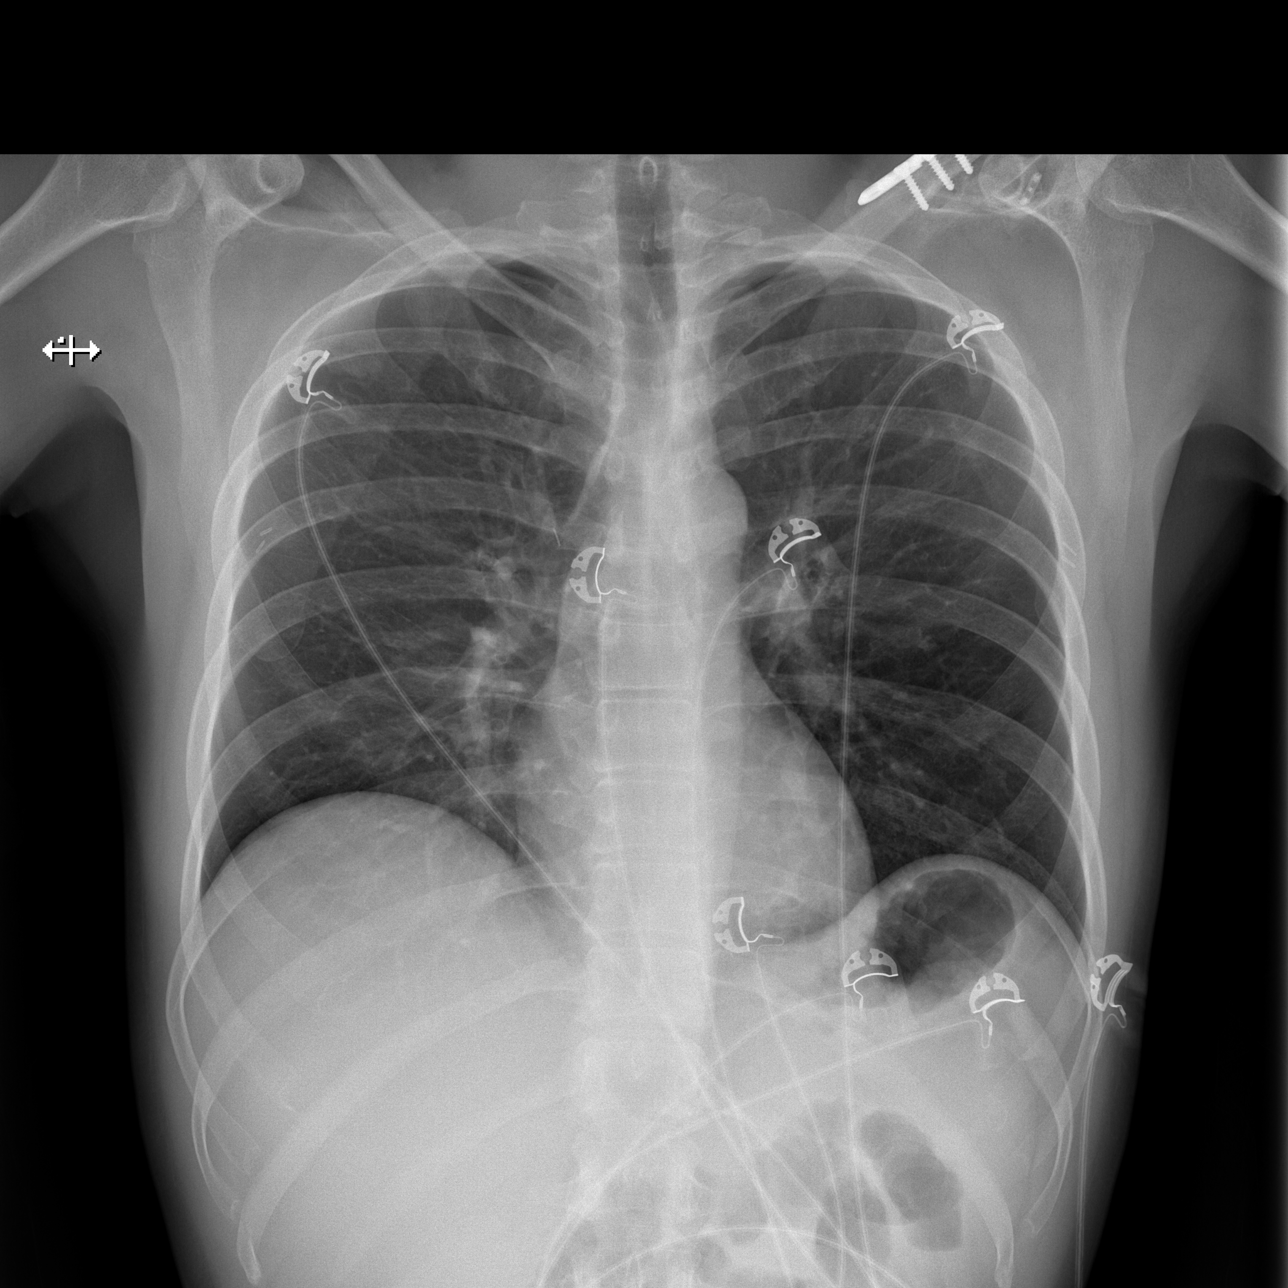

[w abdomen upright]
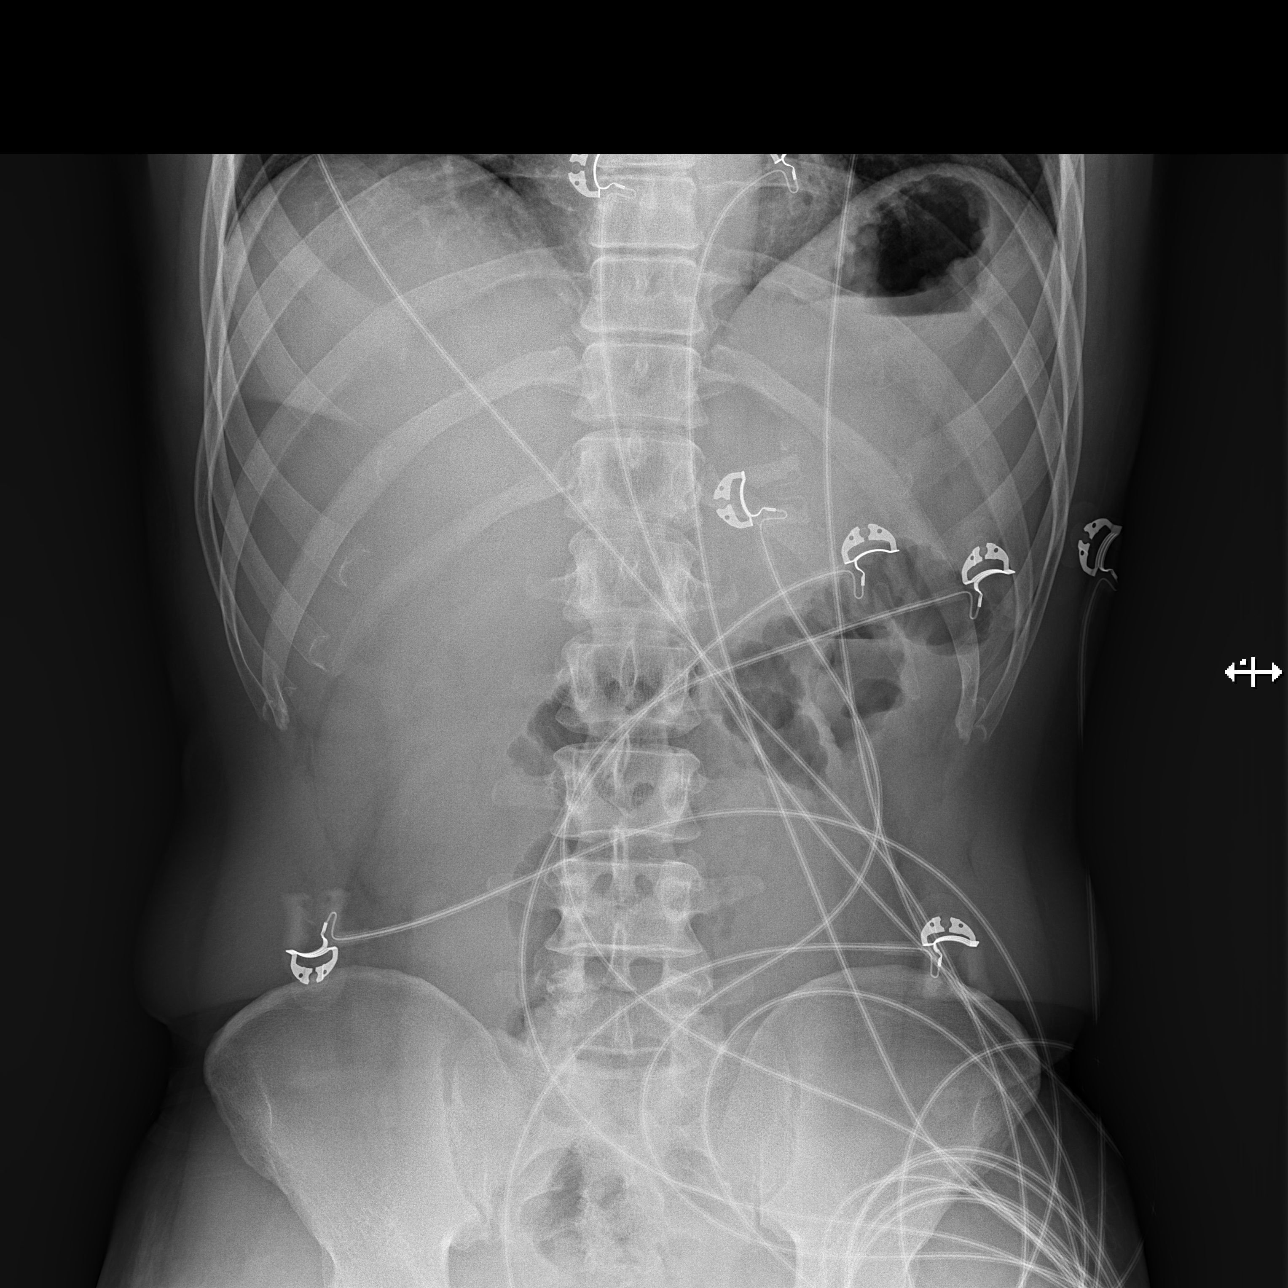

[t abdomen supine]
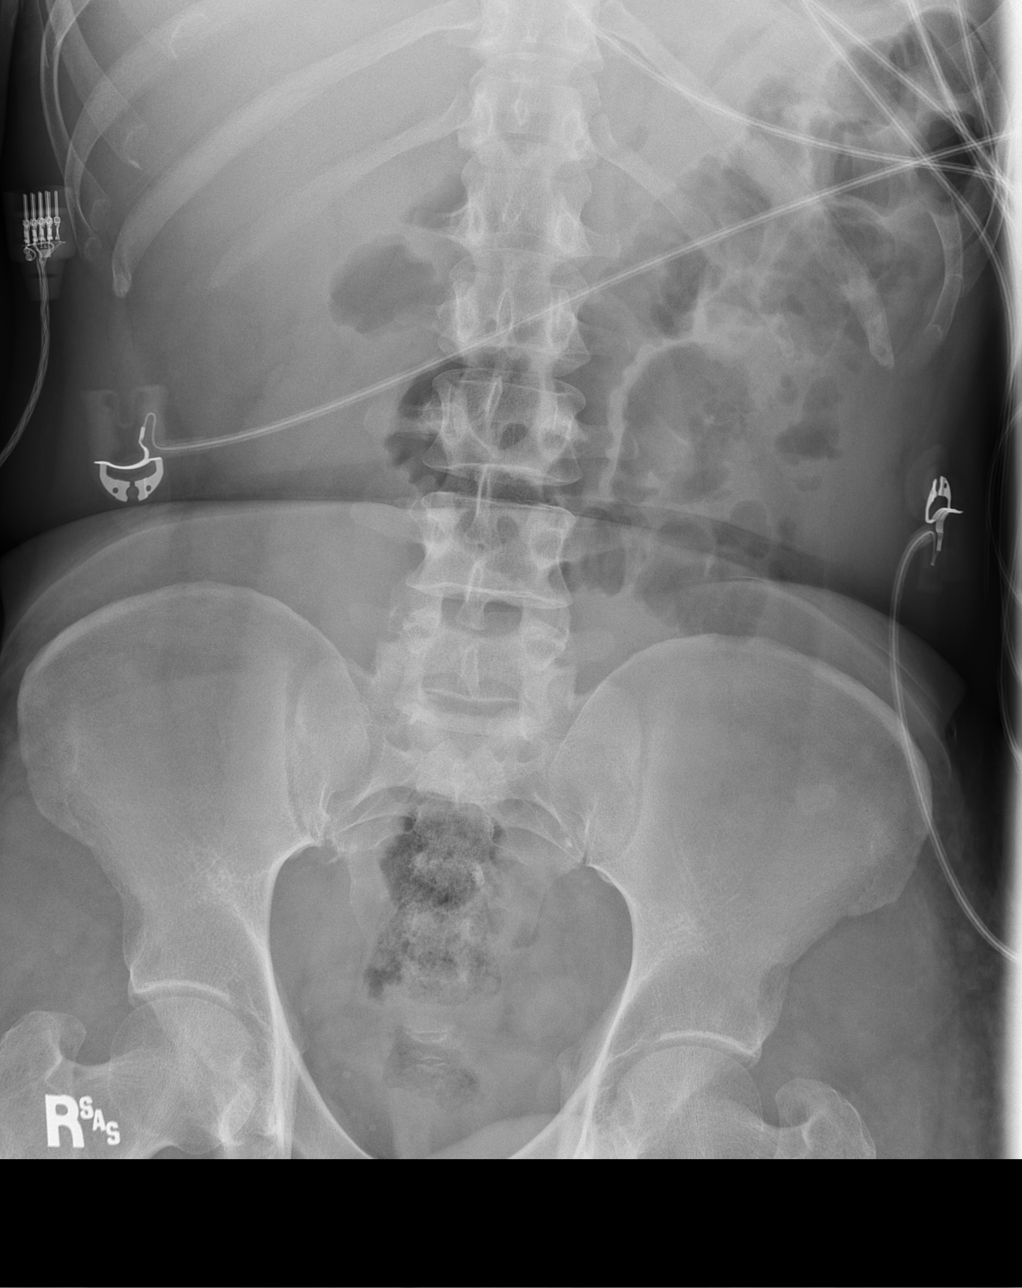

[3 of 3 positions shown; findings below may reference images not displayed]

FINDINGS: Single-view of the chest demonstrates clear lungs and normal heart
size. No pneumothorax or pleural effusion. Postoperative change of
repair of left AC joint separation noted.

Two views of the abdomen show normal bowel gas pattern. No free
intraperitoneal air. No unexpected abdominal calcification or focal
bony abnormality.
IMPRESSION: No acute abnormality chest or abdomen.

## 2020-04-28 IMAGING — US US ABDOMEN COMPLETE
1 series · 14 of 25 positions shown · non-contrast
Comparison: Body CT 10/20/2017

CLINICAL DATA: Abnormal liver function tests.

EXAM:
ABDOMEN ULTRASOUND COMPLETE

[Series 1: us abdomen complete · 14 of 107 slices shown]
[im 1/107]
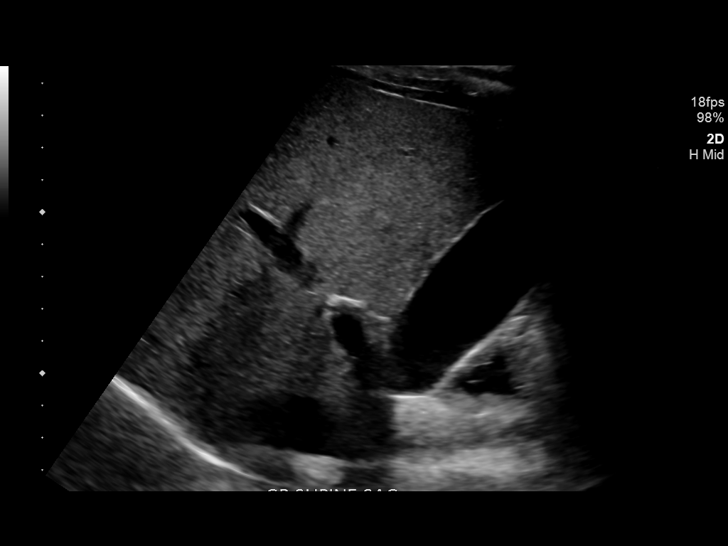
[im 9/107]
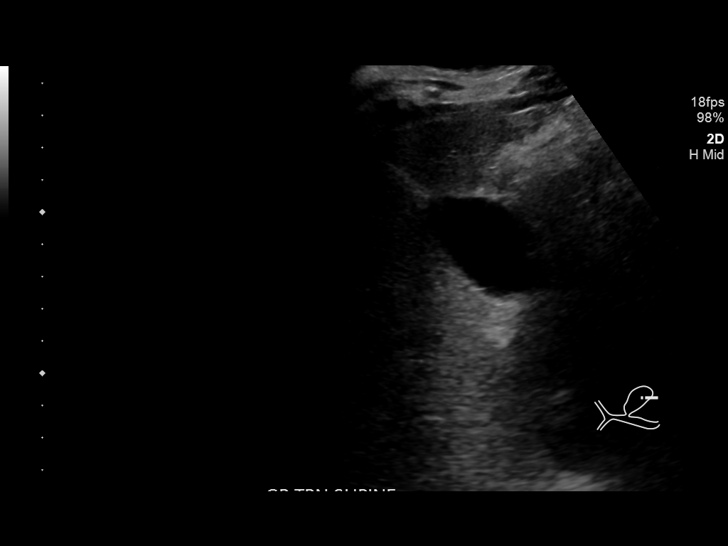
[im 18/107]
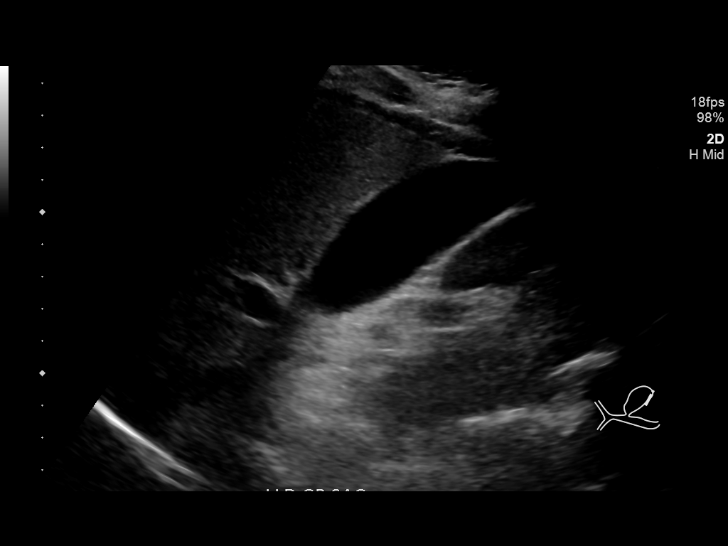
[im 27/107]
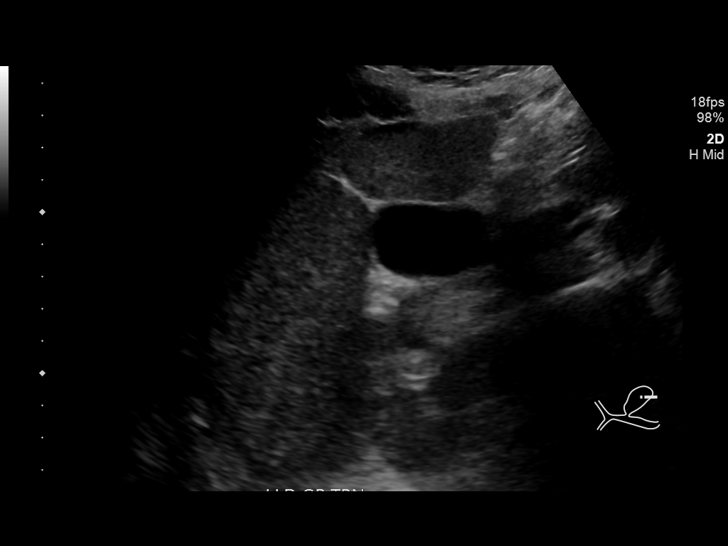
[im 36/107]
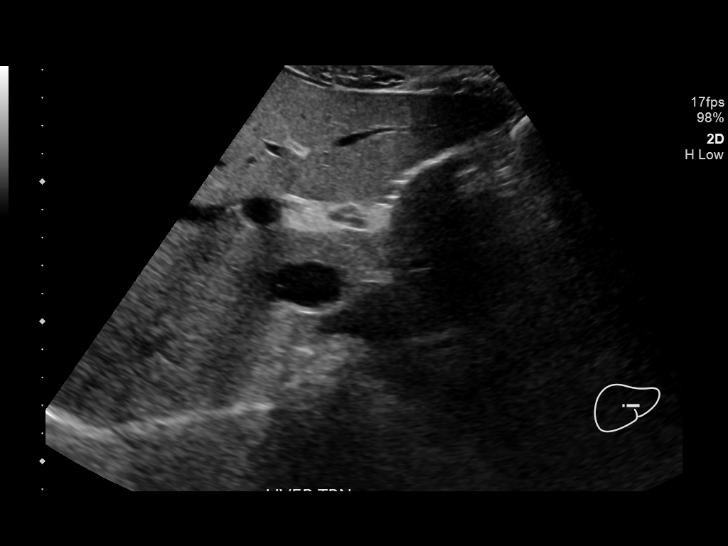
[im 40/107]
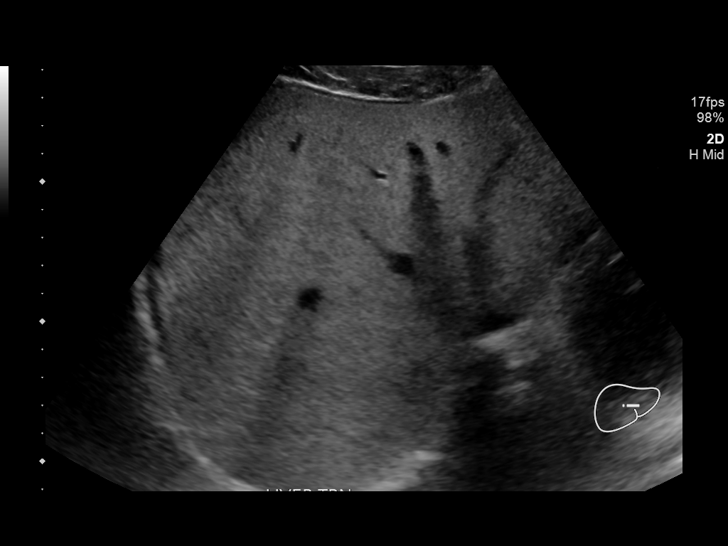
[im 49/107]
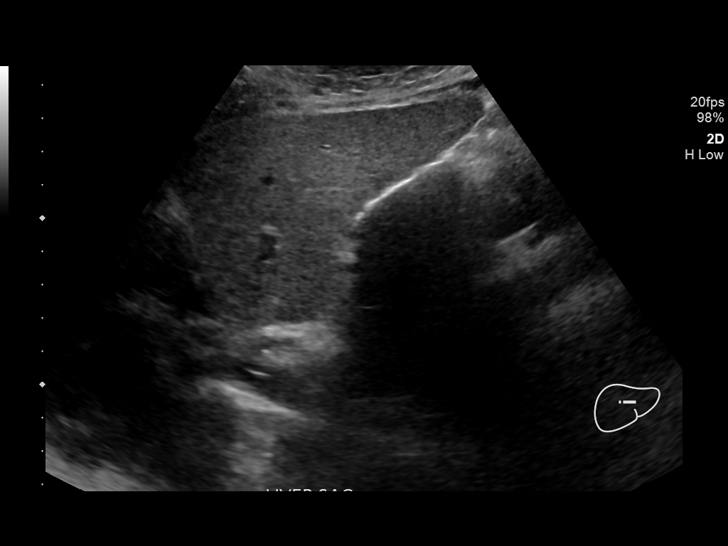
[im 58/107]
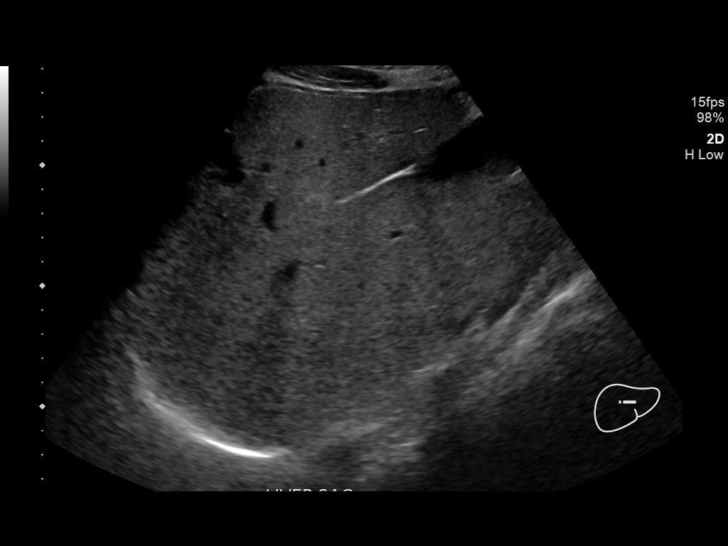
[im 67/107]
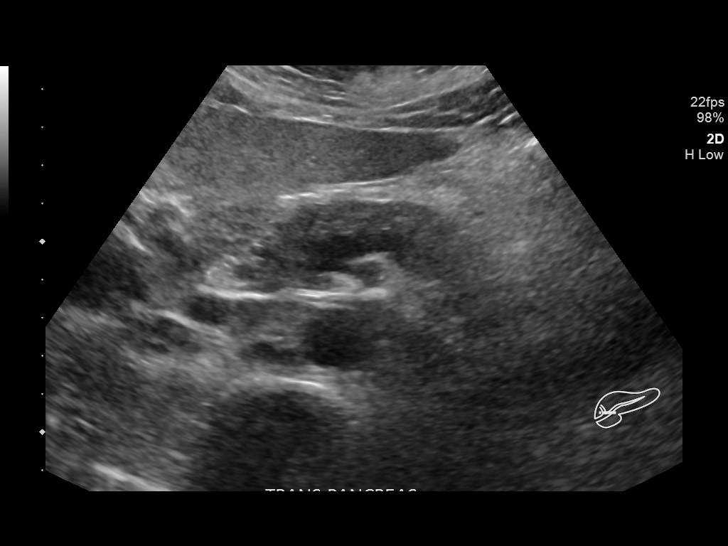
[im 71/107]
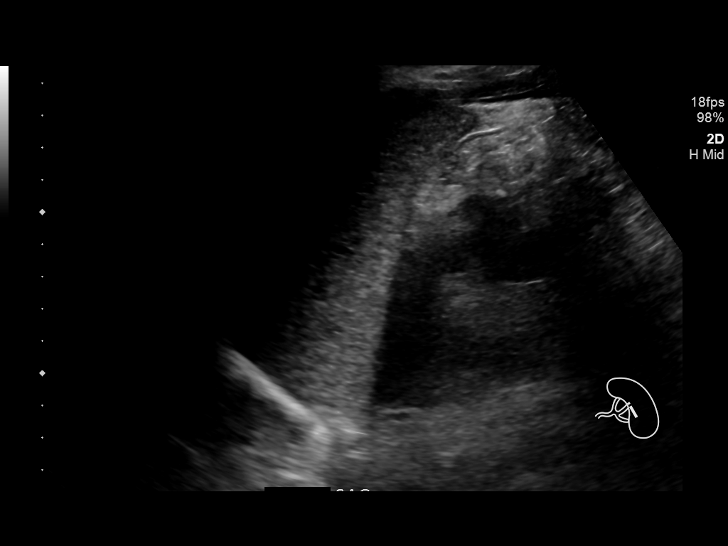
[im 80/107]
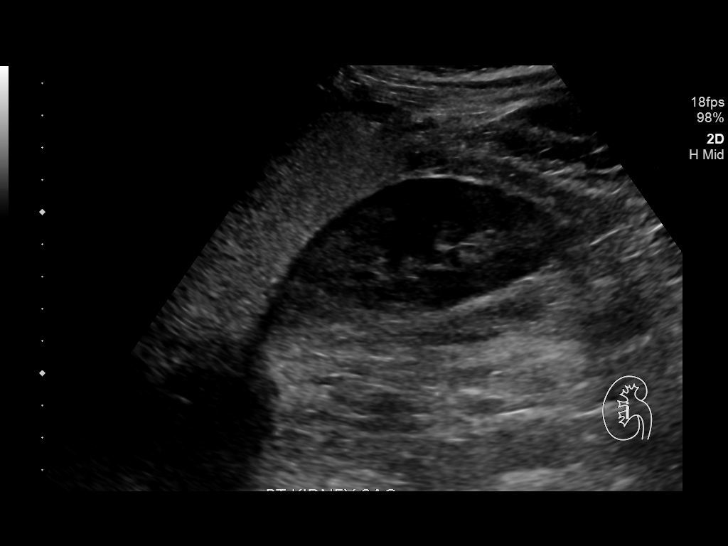
[im 89/107]
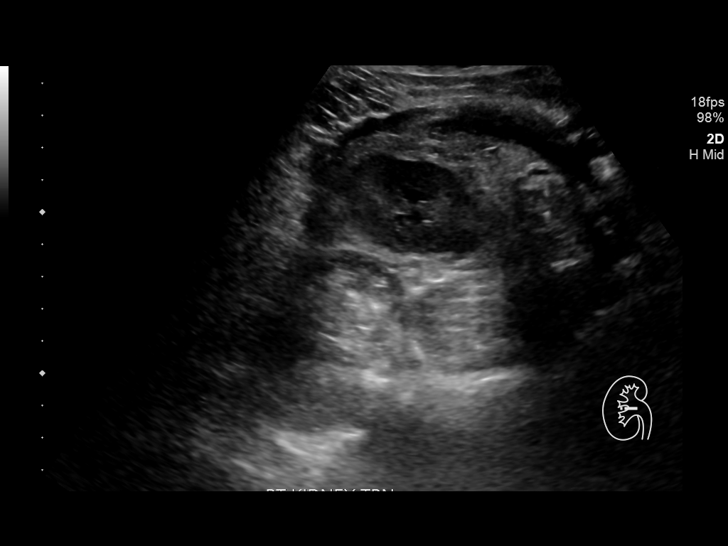
[im 98/107]
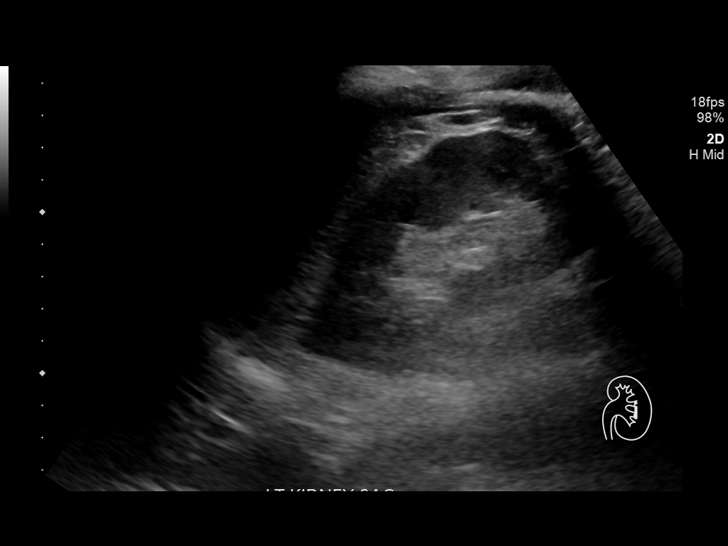
[im 107/107]
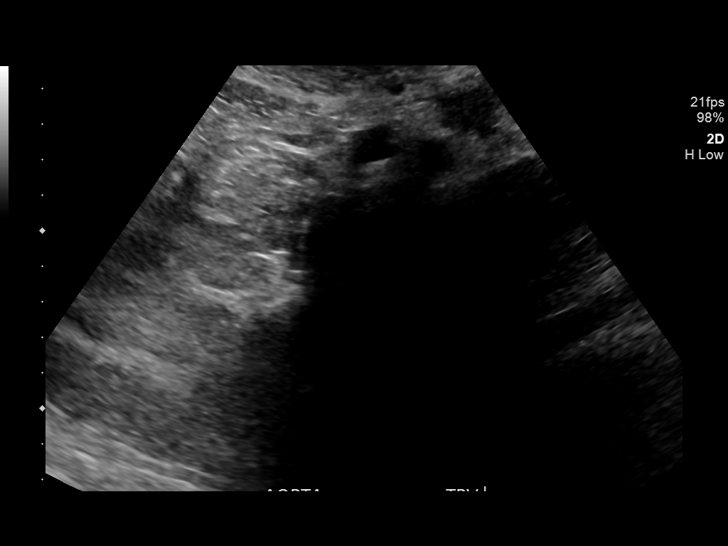

[14 of 25 positions shown; findings below may reference images not displayed]

FINDINGS: Gallbladder: No gallstones or wall thickening visualized. No
sonographic Murphy sign noted by sonographer.

Common bile duct: Diameter: 4.0 mm

Liver: No focal lesion identified. Diffusely increased parenchymal
echogenicity. Portal vein is patent on color Doppler imaging with
normal direction of blood flow towards the liver.

IVC: No abnormality visualized.

Pancreas: Visualized portion unremarkable.

Spleen: Size and appearance within normal limits.

Right Kidney: Length: 11.2 cm. Echogenicity within normal limits. No
mass or hydronephrosis visualized.

Left Kidney: Length: 9.7 cm. Smaller due to prior partial
nephrectomy. No mass or hydronephrosis visualized.

Abdominal aorta: No aneurysm visualized.

Other findings: None.
IMPRESSION: Hepatic steatosis.

Smaller left kidney, consistent with the history of prior partial
nephrectomy.

## 2020-05-15 IMAGING — CT CT HEAD W/O CM
3 series · 15 of 47 positions shown, 18 images · non-contrast
Comparison: CT of the head performed 10/19/2017

CLINICAL DATA: Acute onset of seizure and fall. Hit back of head on
kitchen floor. Initial encounter.

EXAM:
CT HEAD WITHOUT CONTRAST
TECHNIQUE: Contiguous axial images were obtained from the base of the skull
through the vertex without intravenous contrast.

[Series 2: head wo · axial · 0.47mm/px · z∈[+1517,+1642]mm · 9 of 30 slices shown, 12 images]
[im 3/30  brain]
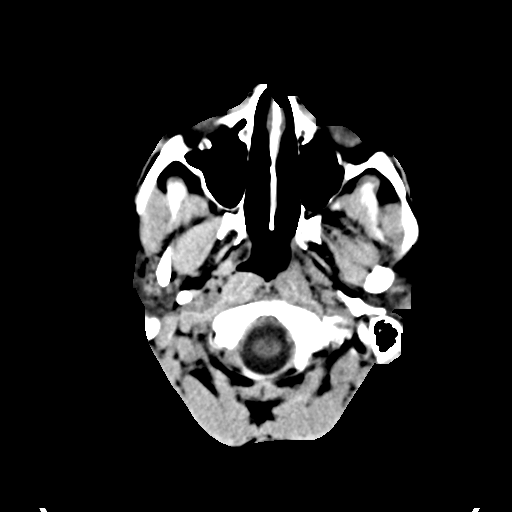
[im 3/30  bone]
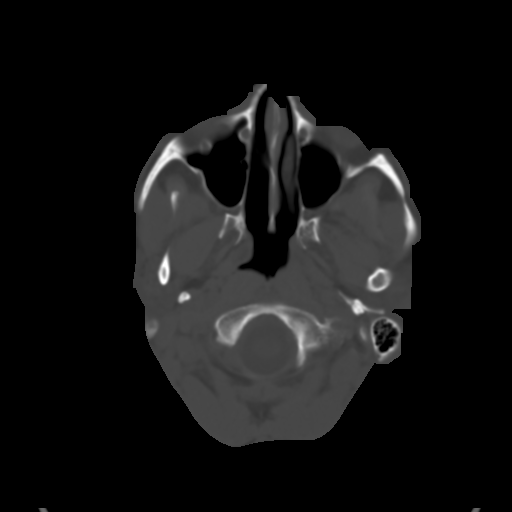
[im 6/30  brain]
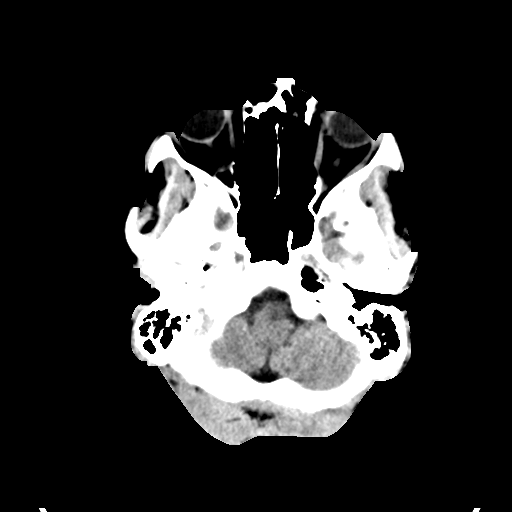
[im 9/30  brain]
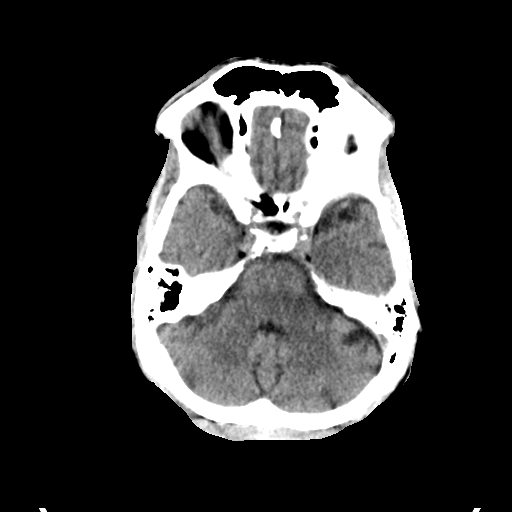
[im 12/30  brain]
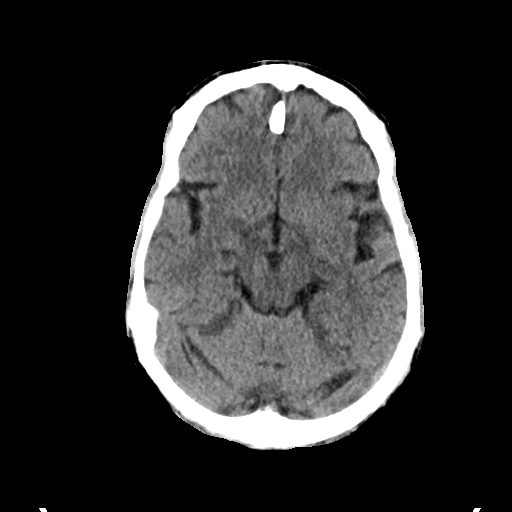
[im 16/30  brain]
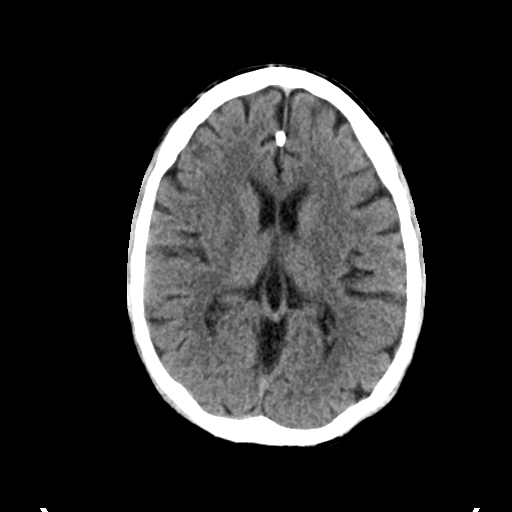
[im 16/30  bone]
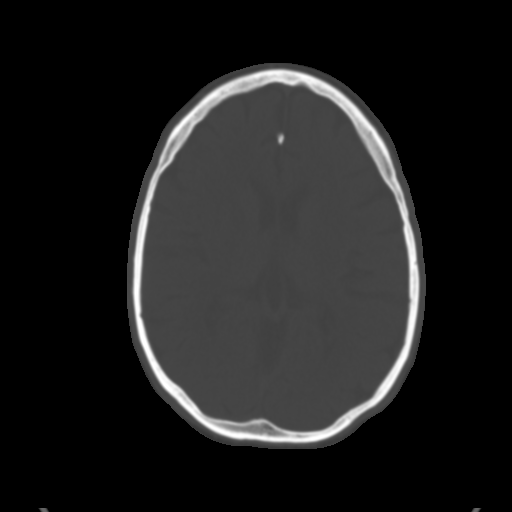
[im 19/30  brain]
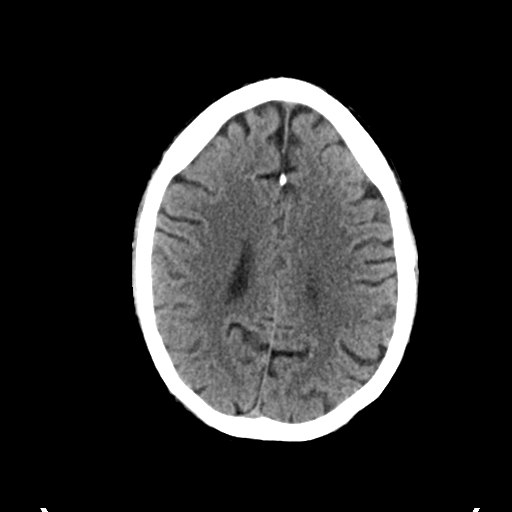
[im 22/30  brain]
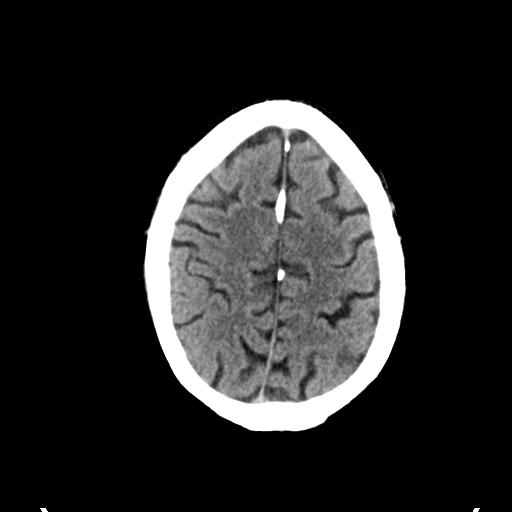
[im 25/30  brain]
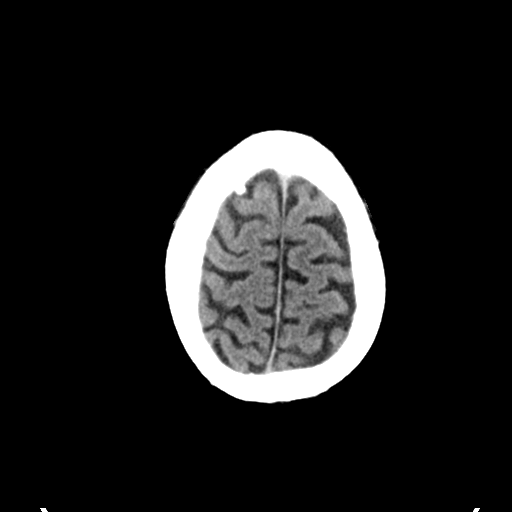
[im 28/30  brain]
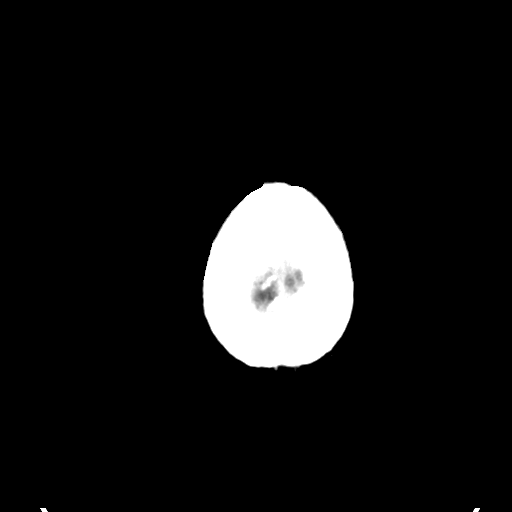
[im 28/30  bone]
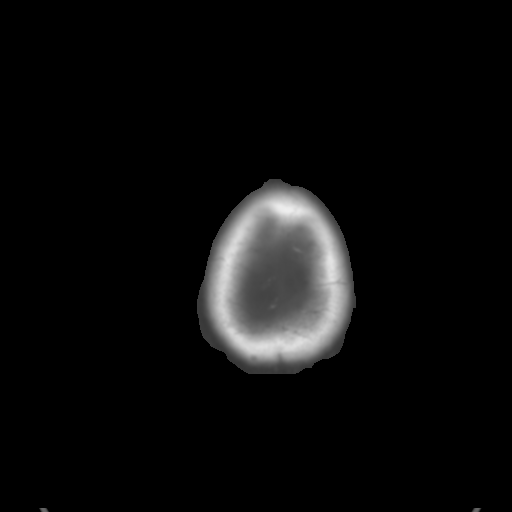

[Series 4: coronal soft tissue · coronal · 0.29mm/px · 3 of 62 slices shown]
[im 21/62  brain]
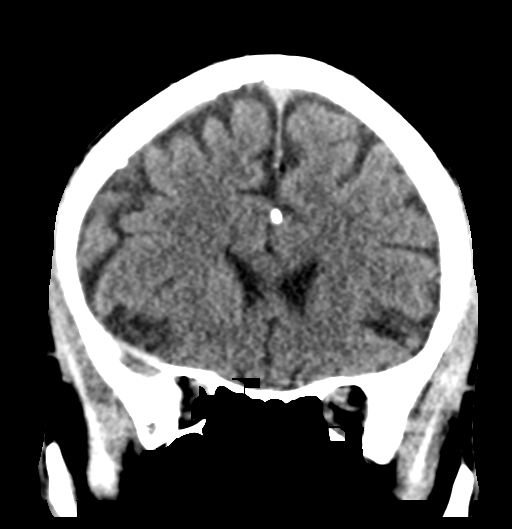
[im 28/62  brain]
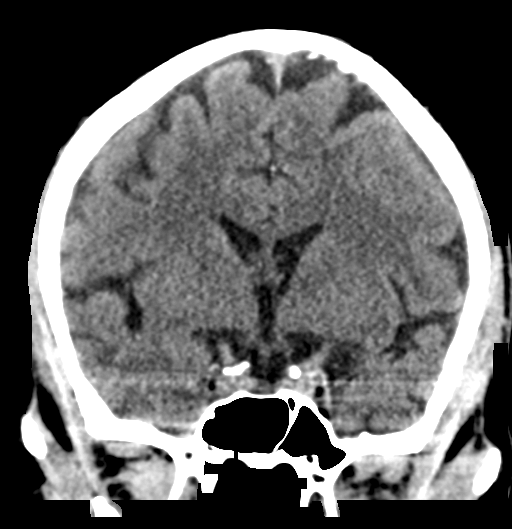
[im 34/62  brain]
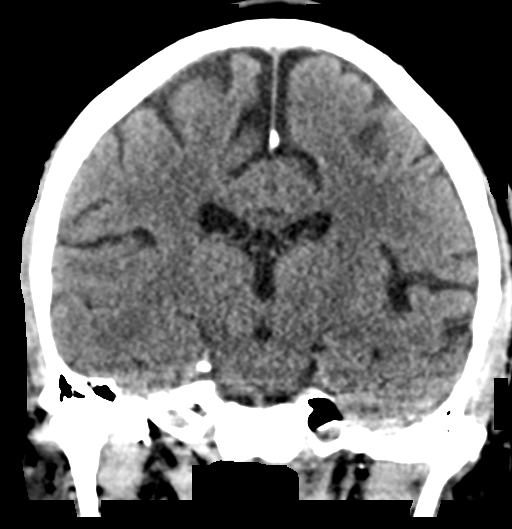

[Series 5: sagittal soft tissue · sagittal · 0.30mm/px · 3 of 49 slices shown]
[im 17/49  brain]
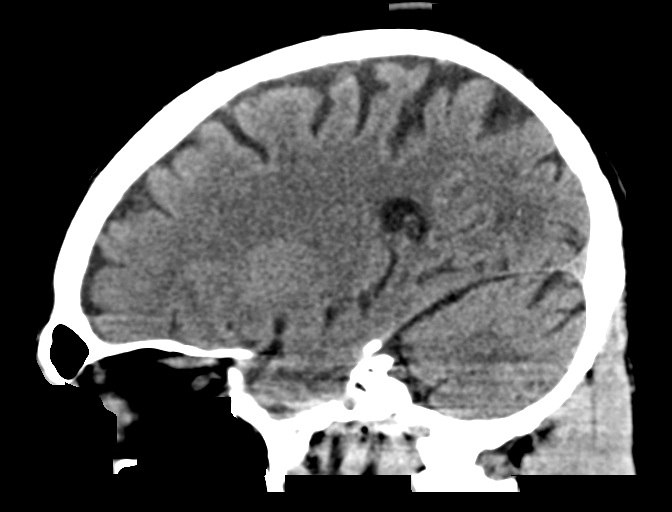
[im 25/49  brain]
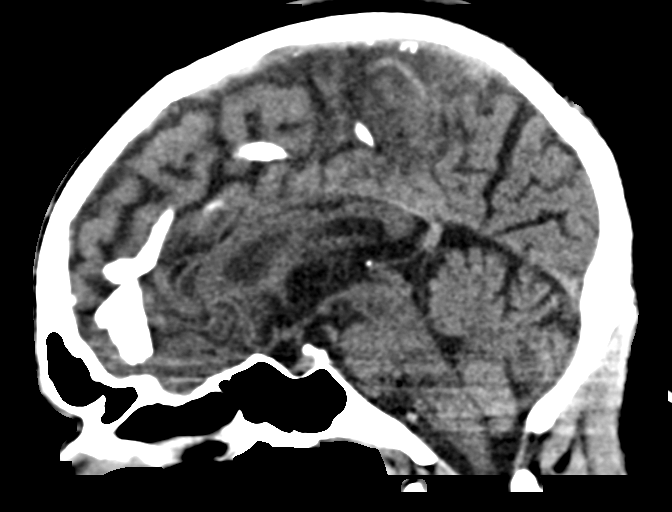
[im 33/49  brain]
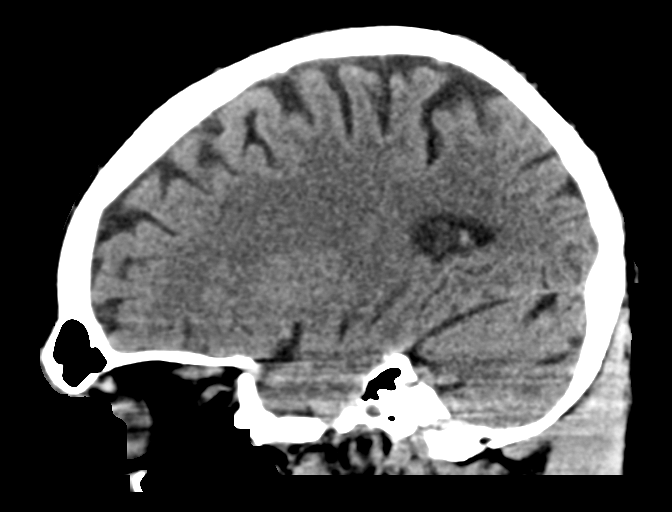

[15 of 47 positions shown; findings below may reference images not displayed]

FINDINGS: Brain: No evidence of acute infarction, hemorrhage, hydrocephalus,
extra-axial collection or mass lesion / mass effect.

Prominence of the sulci suggests mild cortical volume loss.

The brainstem and fourth ventricle are within normal limits. The
basal ganglia are unremarkable in appearance. The cerebral
hemispheres demonstrate grossly normal gray-white differentiation.
No mass effect or midline shift is seen.

Vascular: No hyperdense vessel or unexpected calcification.

Skull: There is no evidence of fracture; visualized osseous
structures are unremarkable in appearance.

Sinuses/Orbits: The orbits are within normal limits. The paranasal
sinuses and mastoid air cells are well-aerated.

Other: No significant soft tissue abnormalities are seen.
IMPRESSION: 1. No evidence of traumatic intracranial injury or fracture.
2. Mild cortical volume loss noted.

## 2020-05-15 IMAGING — CR DG CHEST 2V
2 series · 2 of 2 positions shown · non-contrast
Comparison: Radiographs November 22, 2017.

CLINICAL DATA: Cough.

EXAM:
CHEST - 2 VIEW

[w chest pa]
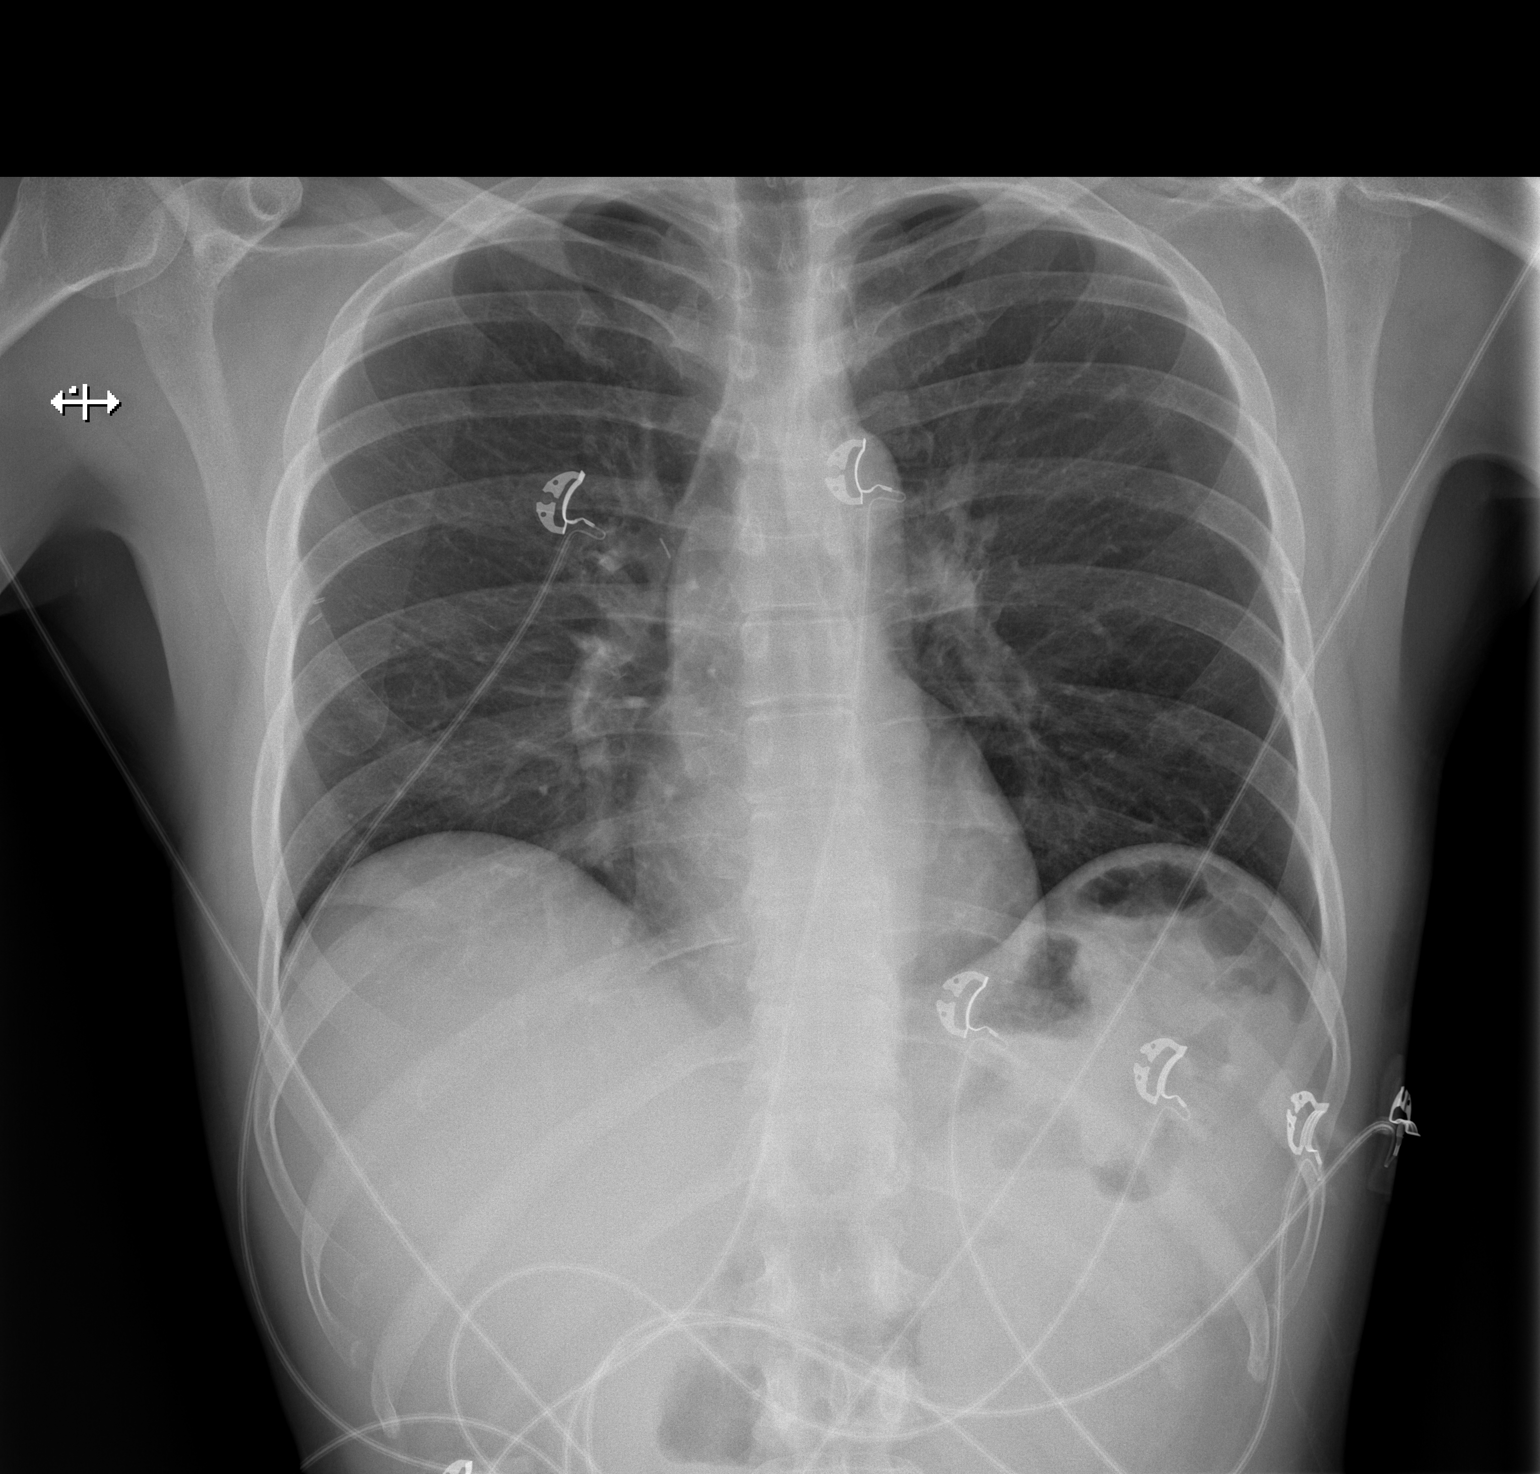

[w chest lat]
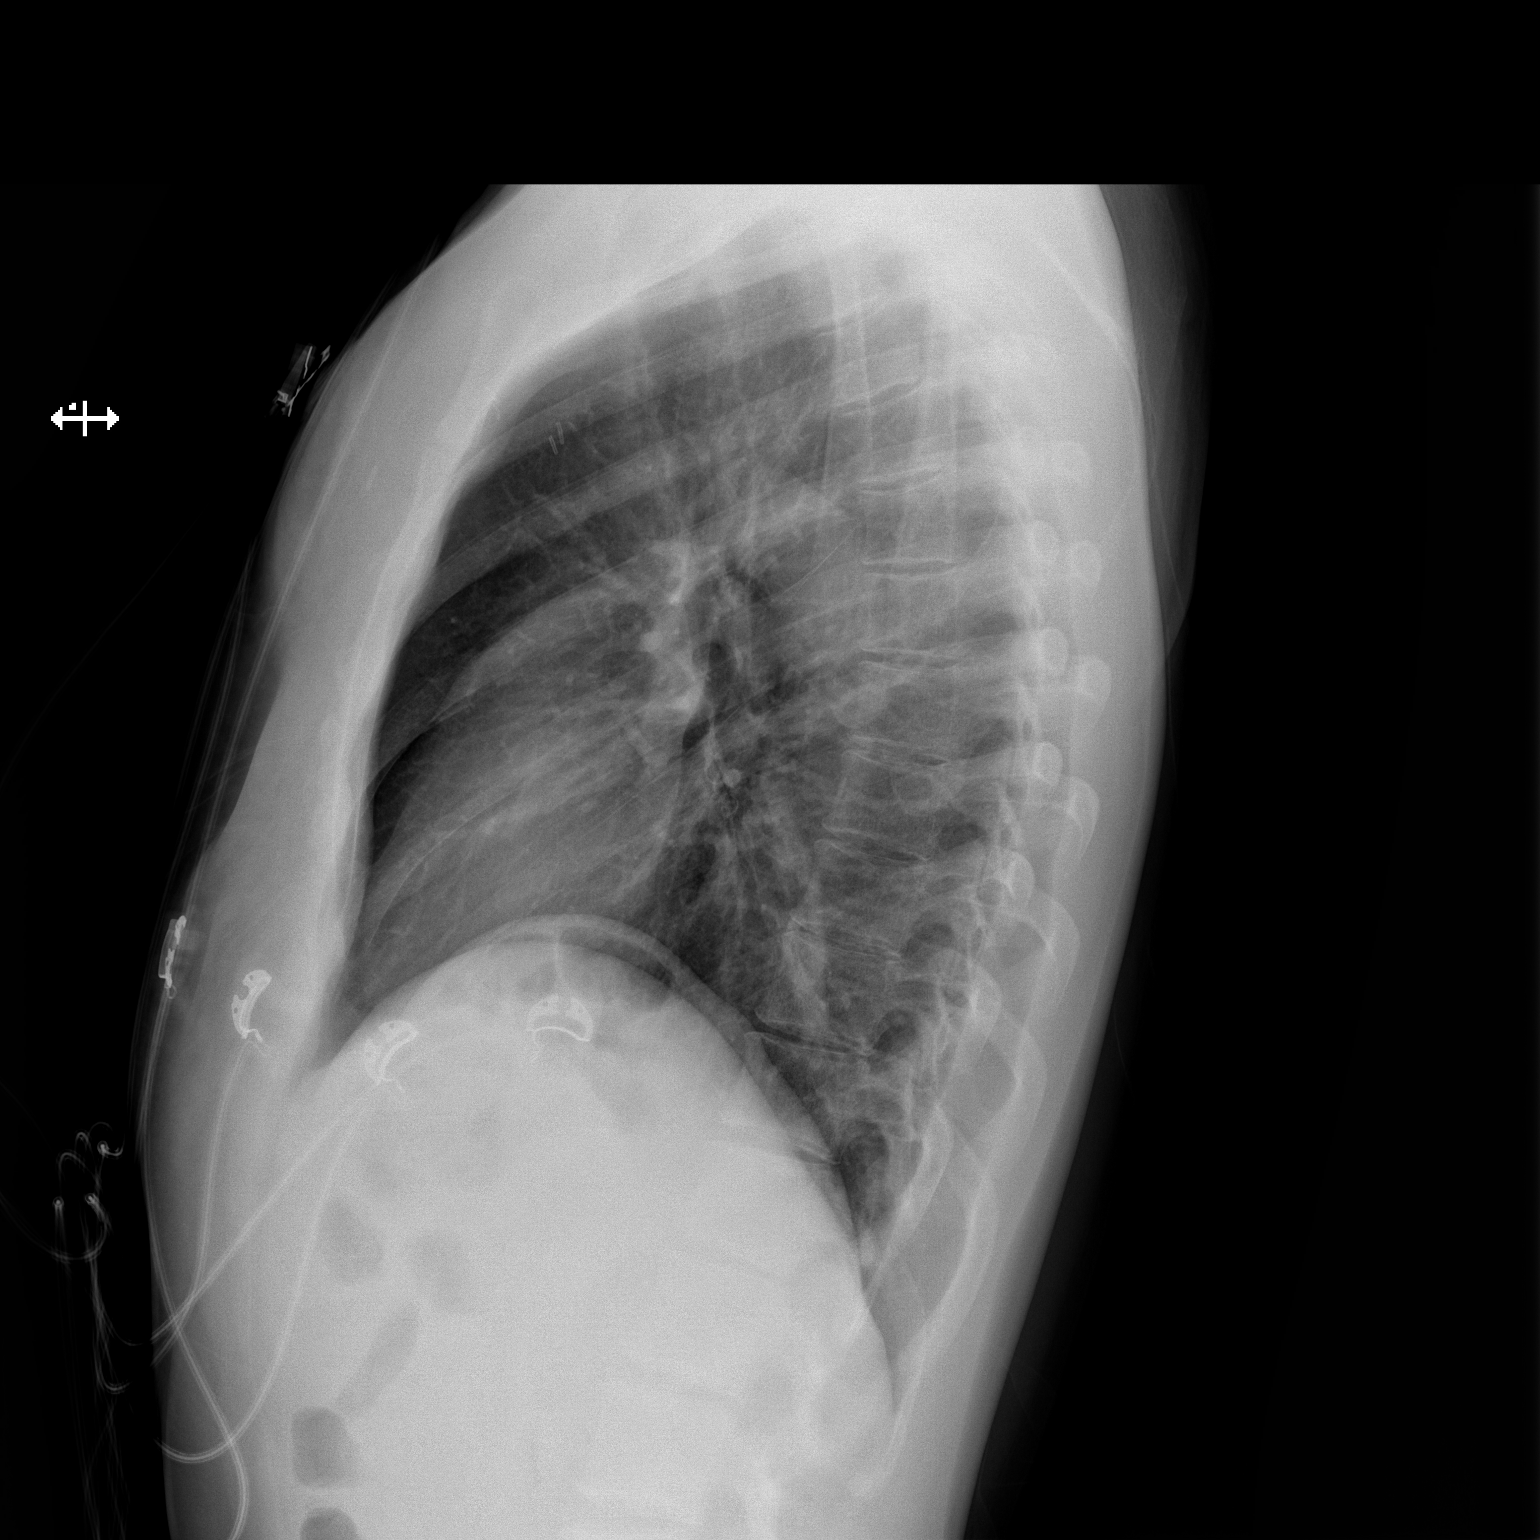

[2 of 2 positions shown; findings below may reference images not displayed]

FINDINGS: The heart size and mediastinal contours are within normal limits.
Both lungs are clear. No pneumothorax or pleural effusion is noted.
The visualized skeletal structures are unremarkable.
IMPRESSION: No active cardiopulmonary disease.

## 2020-05-22 ENCOUNTER — Encounter (HOSPITAL_COMMUNITY): Payer: Self-pay | Admitting: Psychiatry

## 2020-05-22 ENCOUNTER — Encounter: Payer: Self-pay | Admitting: Physical Medicine & Rehabilitation

## 2020-05-22 ENCOUNTER — Ambulatory Visit (INDEPENDENT_AMBULATORY_CARE_PROVIDER_SITE_OTHER): Payer: No Payment, Other | Admitting: Psychiatry

## 2020-05-22 ENCOUNTER — Other Ambulatory Visit: Payer: Self-pay

## 2020-05-22 VITALS — BP 106/79 | HR 81 | Ht 66.0 in | Wt 156.0 lb

## 2020-05-22 DIAGNOSIS — F3164 Bipolar disorder, current episode mixed, severe, with psychotic features: Secondary | ICD-10-CM

## 2020-05-22 DIAGNOSIS — F333 Major depressive disorder, recurrent, severe with psychotic symptoms: Secondary | ICD-10-CM

## 2020-05-22 DIAGNOSIS — F632 Kleptomania: Secondary | ICD-10-CM

## 2020-05-22 DIAGNOSIS — F411 Generalized anxiety disorder: Secondary | ICD-10-CM | POA: Diagnosis not present

## 2020-05-22 DIAGNOSIS — F4310 Post-traumatic stress disorder, unspecified: Secondary | ICD-10-CM | POA: Diagnosis not present

## 2020-05-22 MED ORDER — QUETIAPINE FUMARATE 50 MG PO TABS: 50.0000 mg | ORAL_TABLET | Freq: Every day | ORAL | 2 refills | Status: DC

## 2020-05-22 MED ORDER — HYDROXYZINE HCL 25 MG PO TABS: 25.0000 mg | ORAL_TABLET | Freq: Three times a day (TID) | ORAL | 2 refills | Status: DC | PRN

## 2020-05-22 MED ORDER — LAMOTRIGINE 25 MG PO TABS: 25.0000 mg | ORAL_TABLET | Freq: Every day | ORAL | 1 refills | Status: DC

## 2020-05-22 MED ORDER — TRAZODONE HCL 100 MG PO TABS: 100.0000 mg | ORAL_TABLET | Freq: Every evening | ORAL | 2 refills | Status: DC | PRN

## 2020-05-22 MED ORDER — BUSPIRONE HCL 10 MG PO TABS: 10.0000 mg | ORAL_TABLET | Freq: Three times a day (TID) | ORAL | 2 refills | Status: DC
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
## 2020-05-25 IMAGING — CT CT ANGIO CHEST
2 of 6 series · 18 of 36 positions shown · IV contrast (iopamidol)
Comparison: Is chest x-ray 12/22/2017

CLINICAL DATA: Shortness of breath.

EXAM:
CT ANGIOGRAPHY CHEST WITH CONTRAST
TECHNIQUE: Multidetector CT imaging of the chest was performed using the
standard protocol during bolus administration of intravenous
contrast. Multiplanar CT image reconstructions and MIPs were
obtained to evaluate the vascular anatomy.
CONTRAST:  100mL VGE22S-20O IOPAMIDOL (VGE22S-20O) INJECTION 76%

[Series 7: pe thins · axial · 0.69mm/px · z∈[+1147,+1370]mm · 17 of 251 slices shown]
[im 14/251  lung]
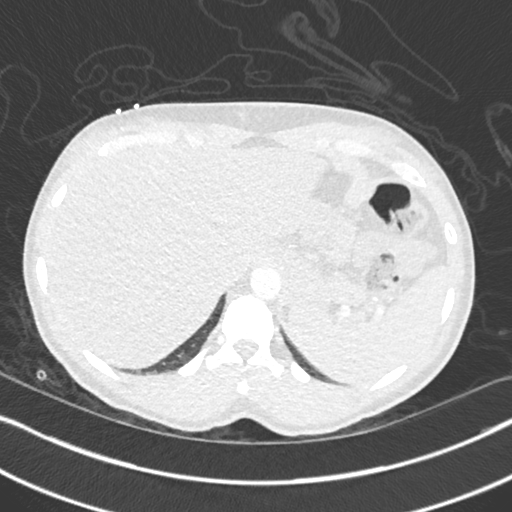
[im 28/251  mediastinal]
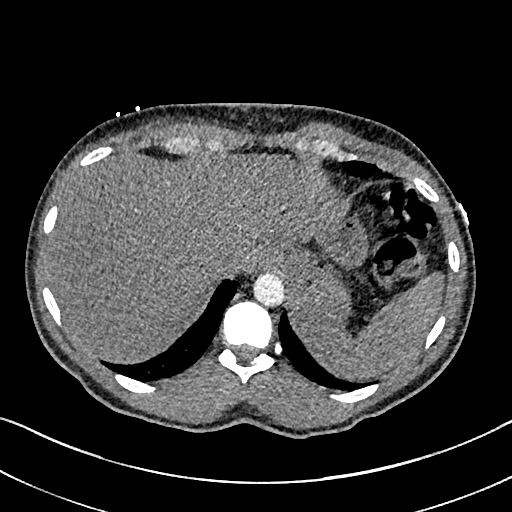
[im 42/251  lung]
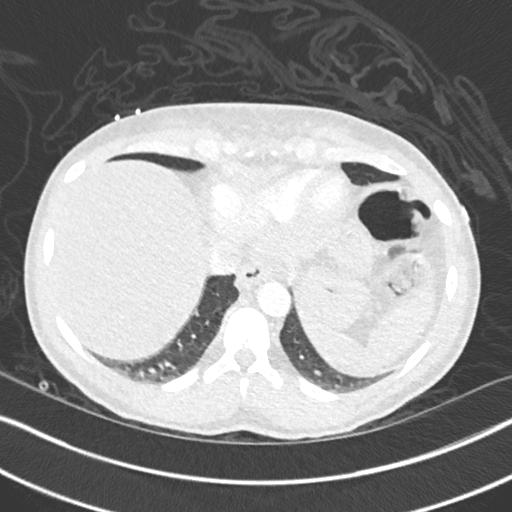
[im 56/251  mediastinal]
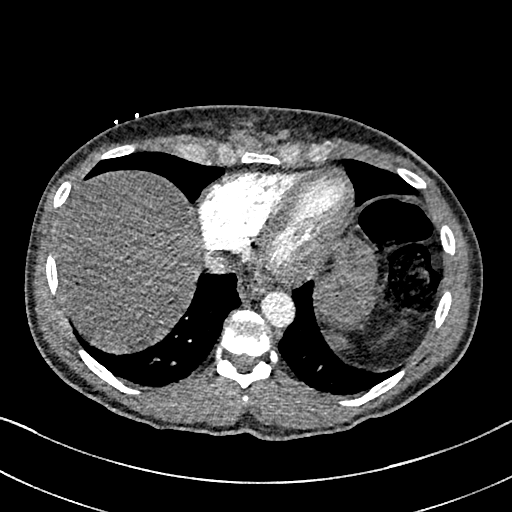
[im 70/251  lung]
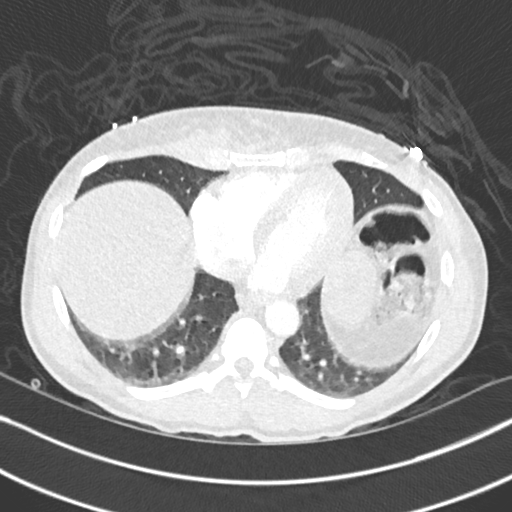
[im 84/251  mediastinal]
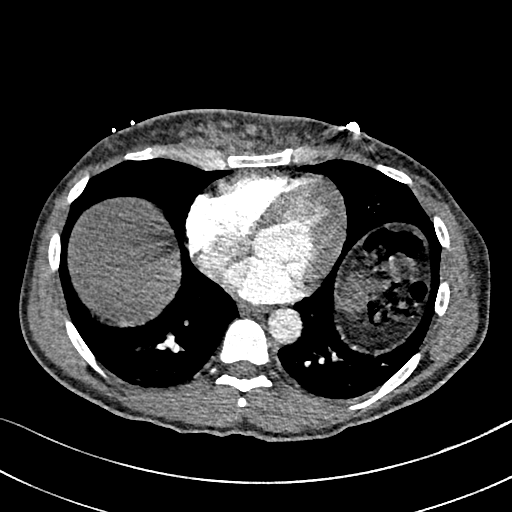
[im 98/251  lung]
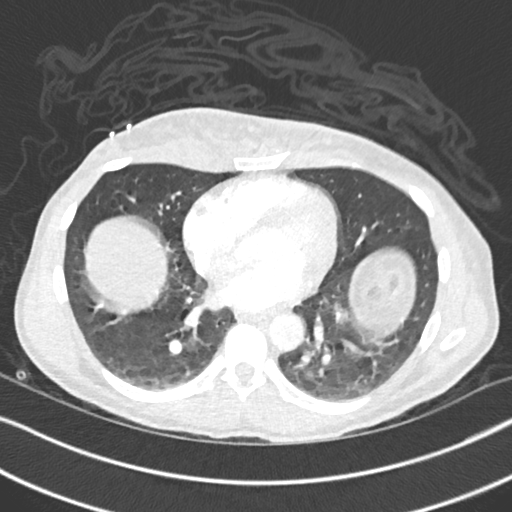
[im 112/251  mediastinal]
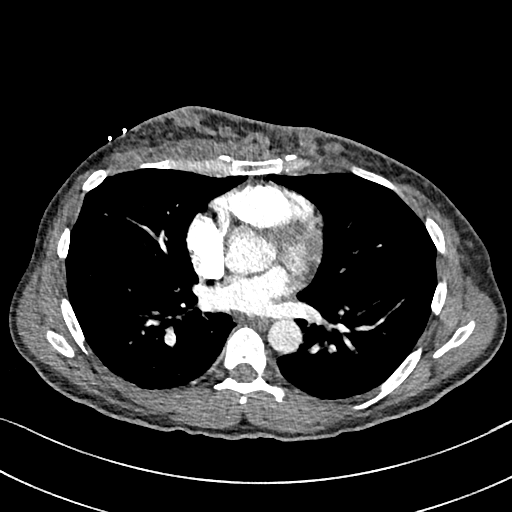
[im 126/251  lung]
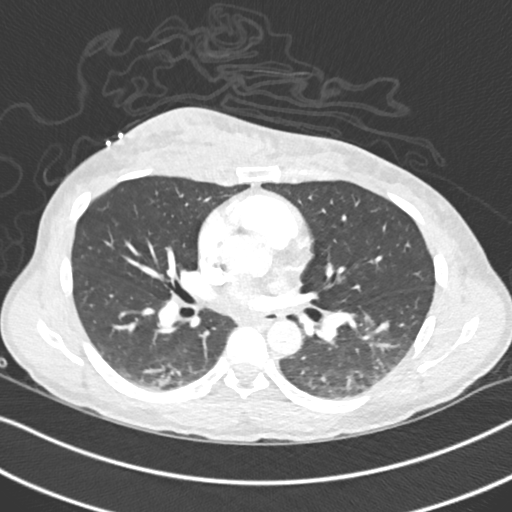
[im 139/251  mediastinal]
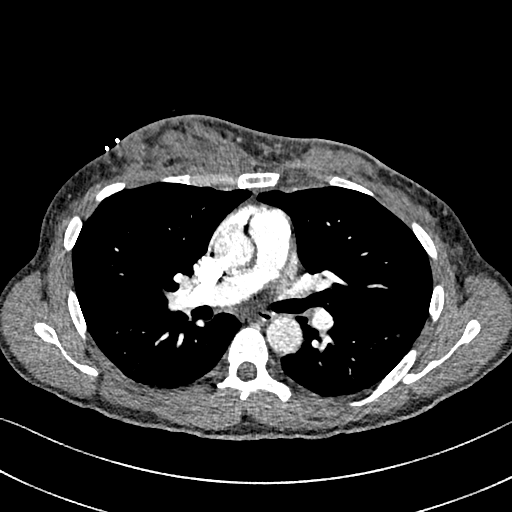
[im 153/251  lung]
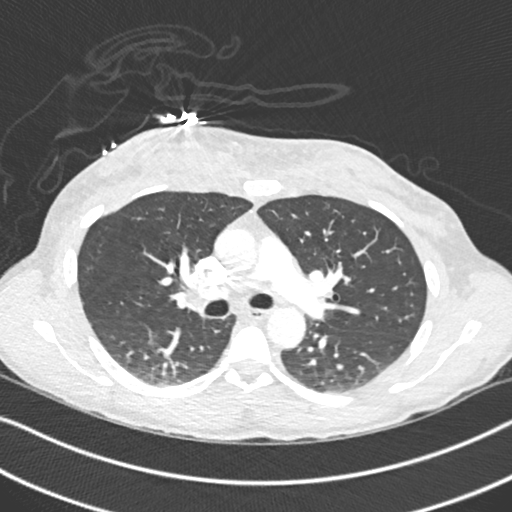
[im 167/251  mediastinal]
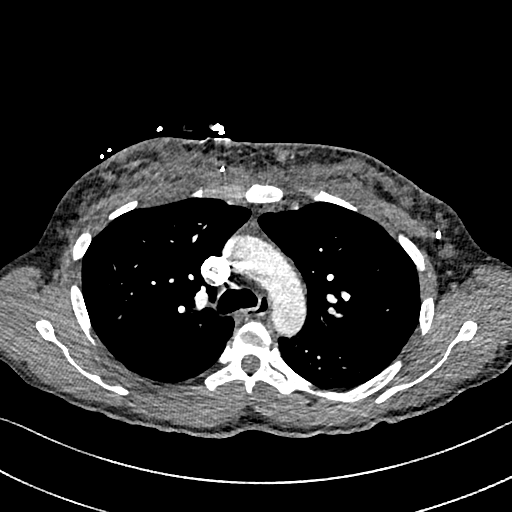
[im 181/251  lung]
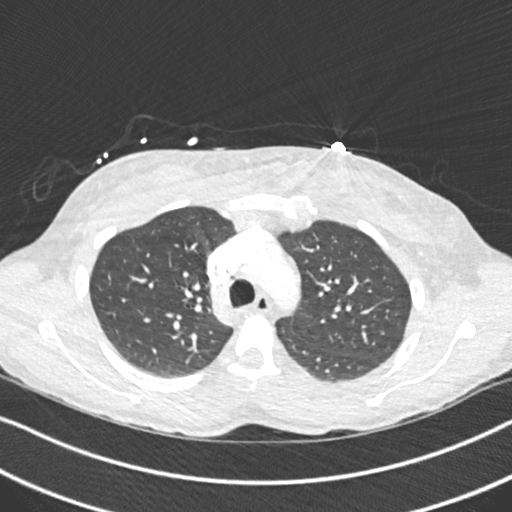
[im 195/251  mediastinal]
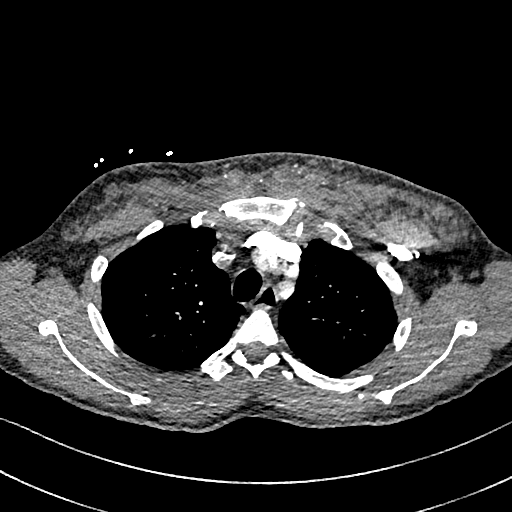
[im 209/251  lung]
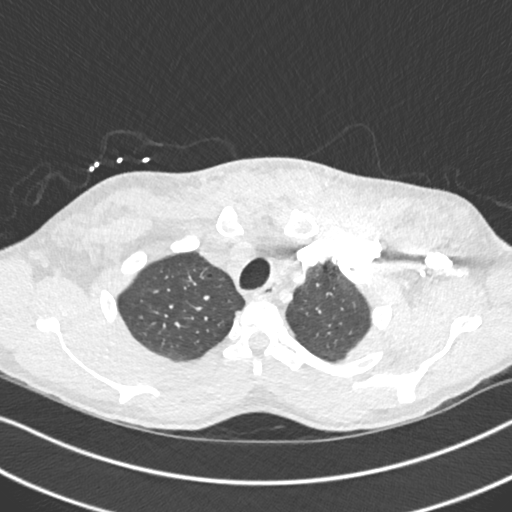
[im 223/251  mediastinal]
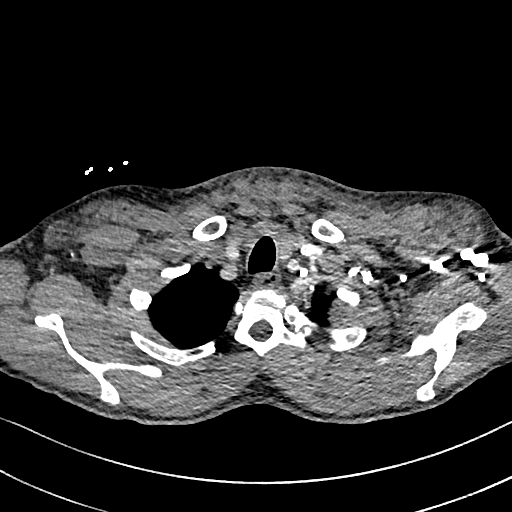
[im 237/251  lung]
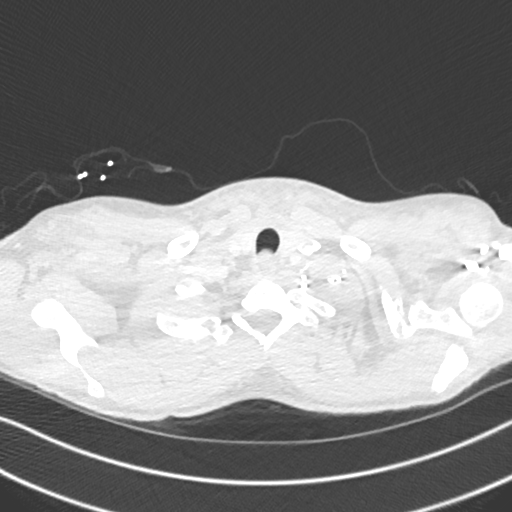

[Series 8: pe 2mm cor · coronal · 0.50mm/px · 1 of 122 slices shown]
[im 61/122  mediastinal]
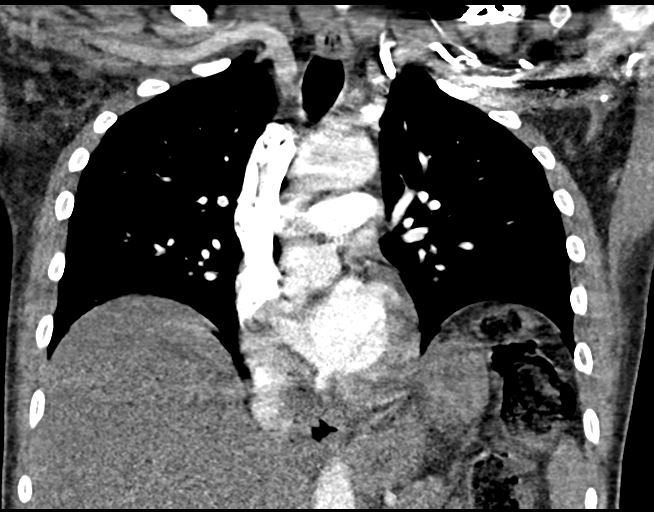

[18 of 36 positions shown; findings below may reference images not displayed]

FINDINGS: Cardiovascular: Pulmonary arteries are moderately well opacified.
There is no evidence for pulmonary embolus to the segmental level.
Smaller pulmonary emboli would be difficult to detect given the
artifact in the smaller vessels. Heart size normal. No pericardial
effusion. The thoracic aorta is not aneurysmal. No evidence for
dissection or significant atherosclerotic calcification.

Mediastinum/Nodes: The visualized portion of the thyroid gland has a
normal appearance. Esophagus is normal. No mediastinal, hilar, or
axillary adenopathy.

Lungs/Pleura: Lungs are clear. No pleural effusion or pneumothorax.

Upper Abdomen: There is diffuse low-attenuation of the liver
consistent with hepatic steatosis.

Musculoskeletal: No chest wall abnormality. No acute or significant
osseous findings.

There is asymmetry chest wall soft tissue, RIGHT greater than LEFT.
History provided the electronic medical record is bilateral
mastectomy for breast cancer. However, there is significant
bilateral chest wall soft tissue raising the question of
gynecomastia versus recurrence of breast cancer.

Review of the MIP images confirms the above findings.
IMPRESSION: 1. The study is adequate to exclude larger pulmonary emboli but is
inadequate to exclude smaller pulmonary emboli. No pulmonary
infarcts or suspicious pulmonary nodules. No infiltrates or
effusions.
2. Hepatic steatosis.
3. Significant anterior chest wall soft tissue, RIGHT greater than
LEFT. Recommend physical exam for correlation. Consider bilateral
diagnostic mammogram to exclude malignancy.

## 2020-05-25 IMAGING — CR DG CHEST 2V
2 series · 2 of 2 positions shown · non-contrast
Comparison: 12/12/2017

CLINICAL DATA: Chest pain

EXAM:
CHEST - 2 VIEW

[chest lat]
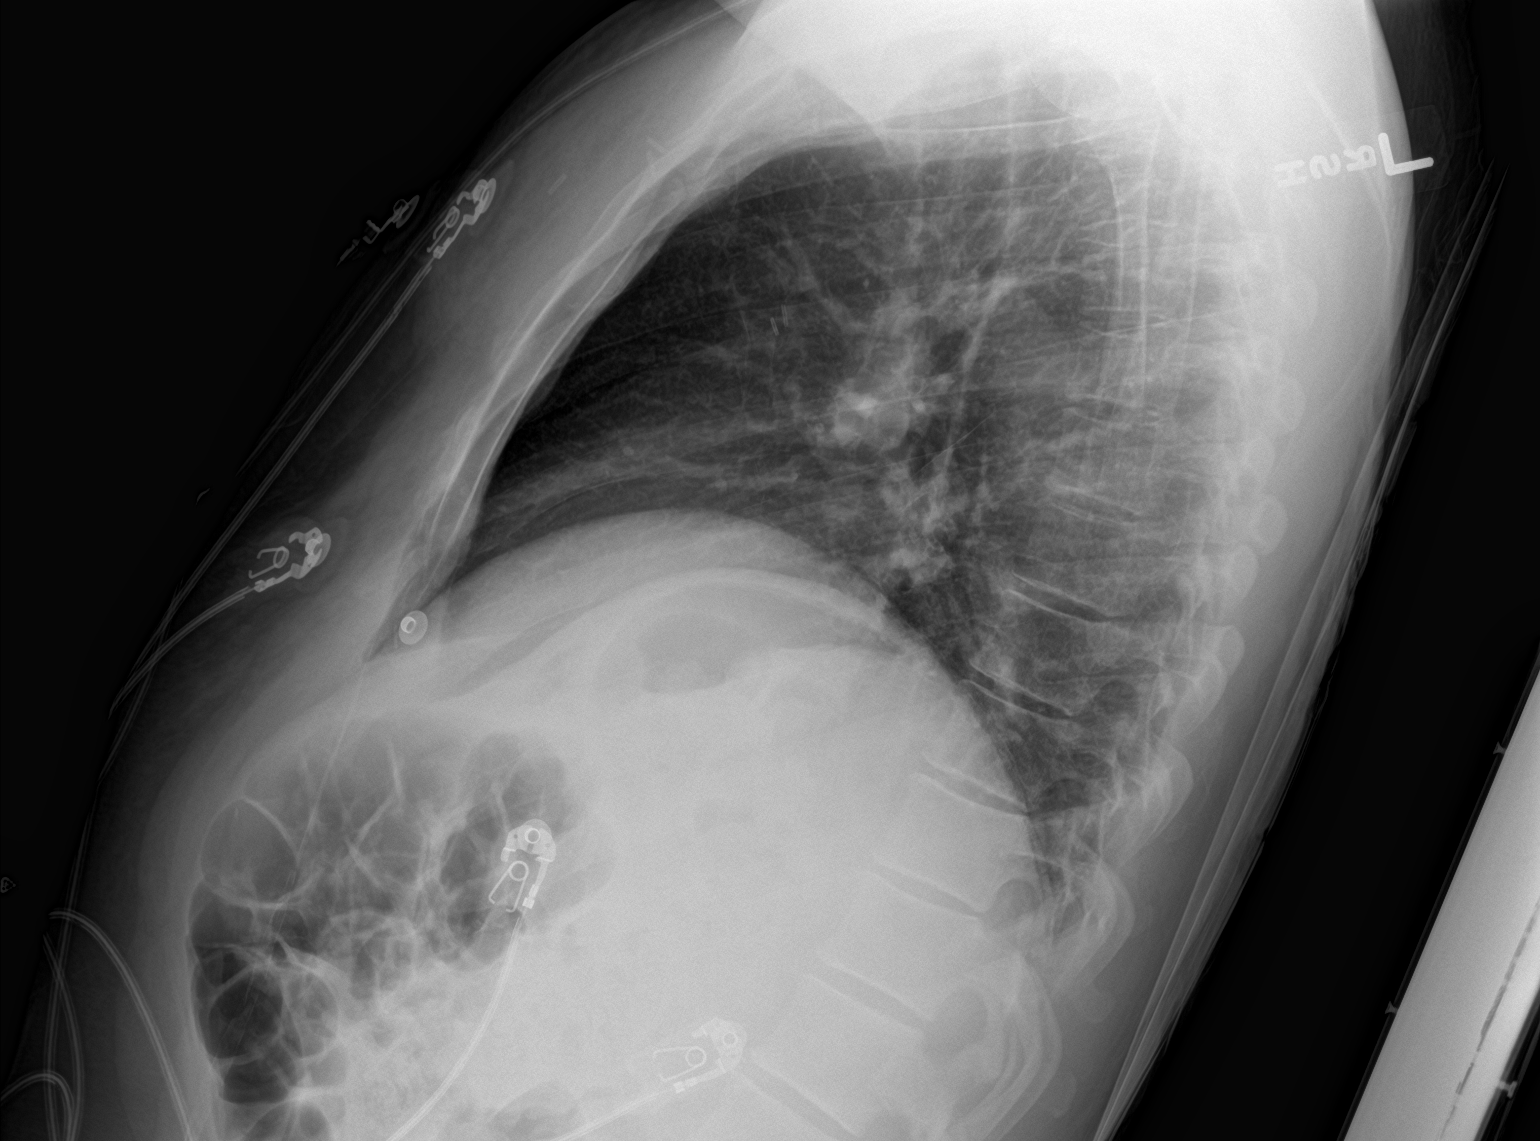

[chest ap]
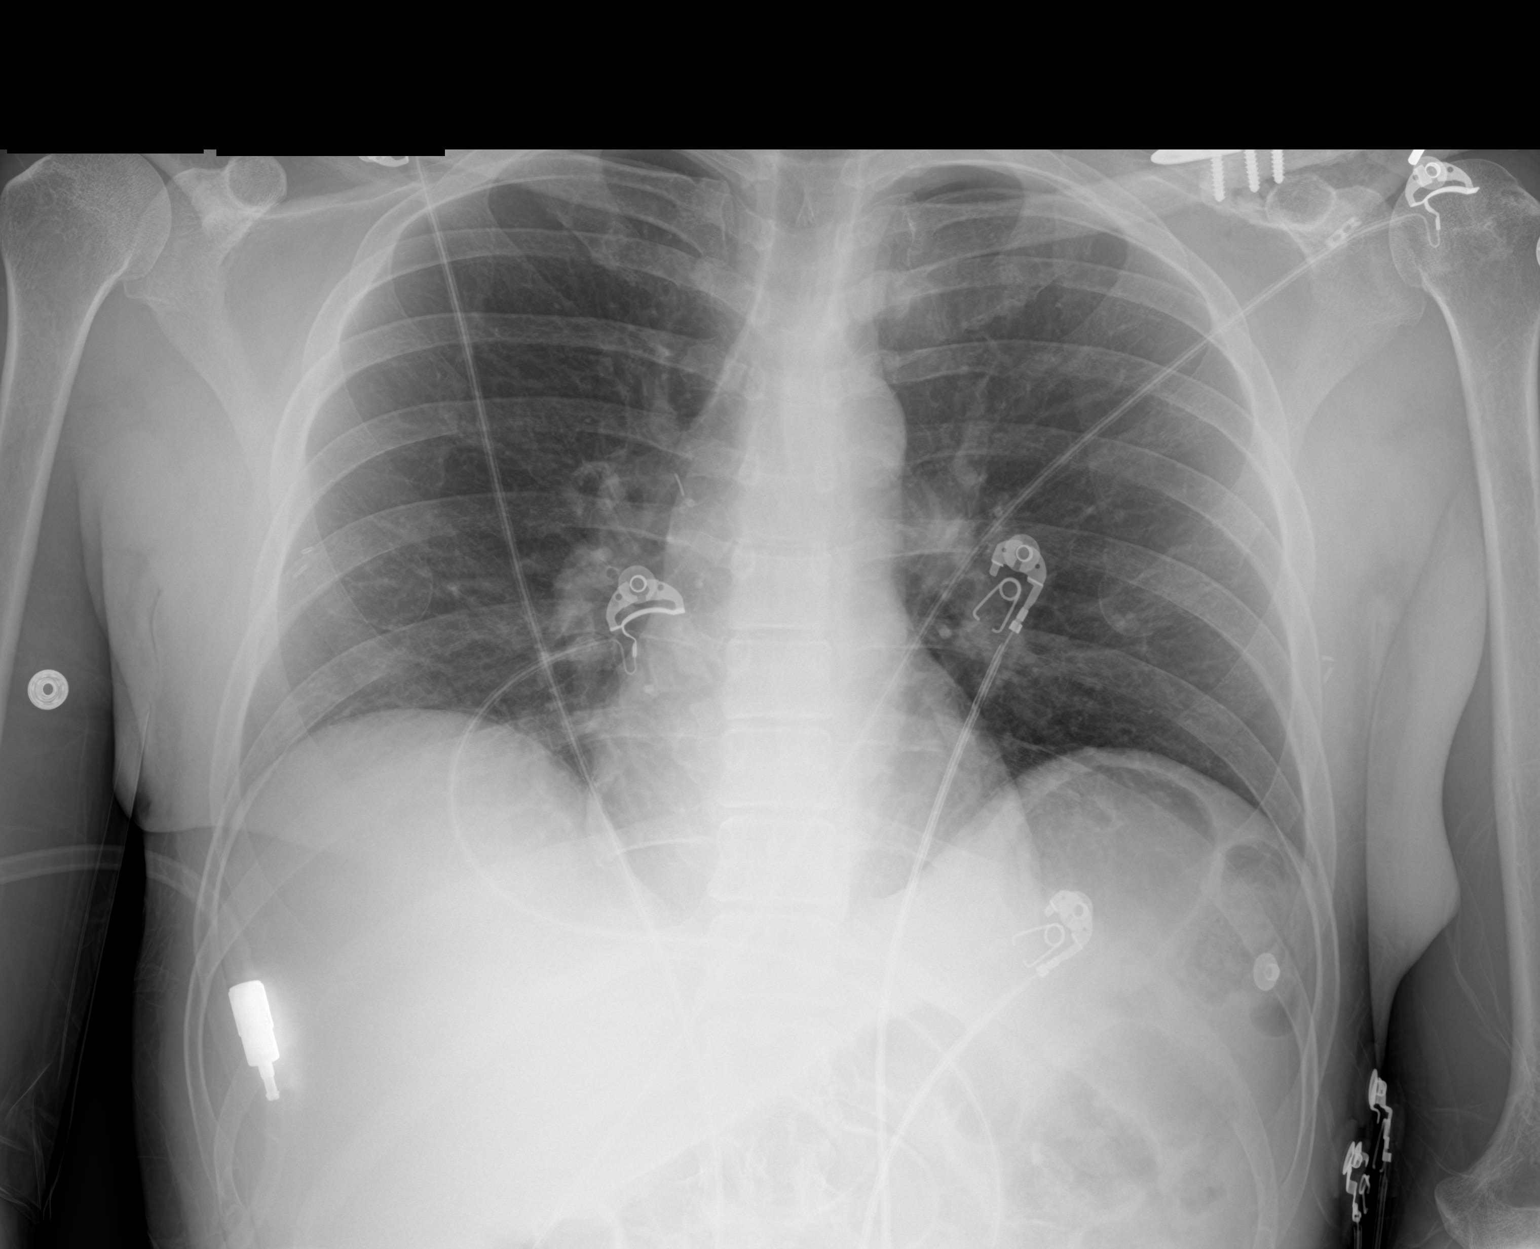

[2 of 2 positions shown; findings below may reference images not displayed]

FINDINGS: Low lung volumes. Heart and mediastinal contours are within normal
limits. No focal opacities or effusions. No acute bony abnormality.
IMPRESSION: Low volumes.  No active cardiopulmonary disease.

## 2020-06-02 IMAGING — CT CT CERVICAL SPINE W/O CM
2 of 12 series · 5 of 33 positions shown, 6 images · non-contrast
Comparison: Head CT dated 12/12/2017.

CLINICAL DATA: Injuries from seizure, RIGHT cheek and bottom lip
are swollen, tachycardia, ETOH.

EXAM:
CT HEAD WITHOUT CONTRAST
CT MAXILLOFACIAL WITHOUT CONTRAST
CT CERVICAL SPINE WITHOUT CONTRAST
TECHNIQUE: Multidetector CT imaging of the head, cervical spine, and
maxillofacial structures were performed using the standard protocol
without intravenous contrast. Multiplanar CT image reconstructions
of the cervical spine and maxillofacial structures were also
generated.

[Series 14: facialbone 2.0 sag st · sagittal · 0.30mm/px · 3 of 76 slices shown]
[im 19/76  bone]
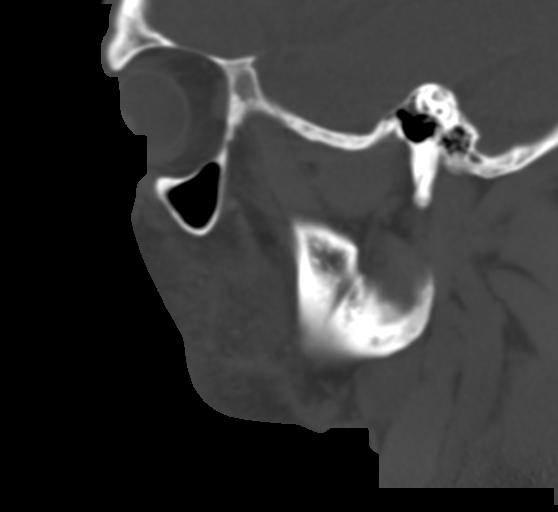
[im 38/76  bone]
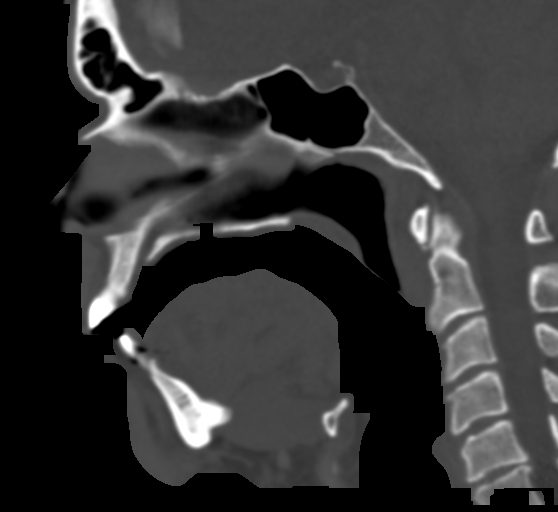
[im 57/76  bone]
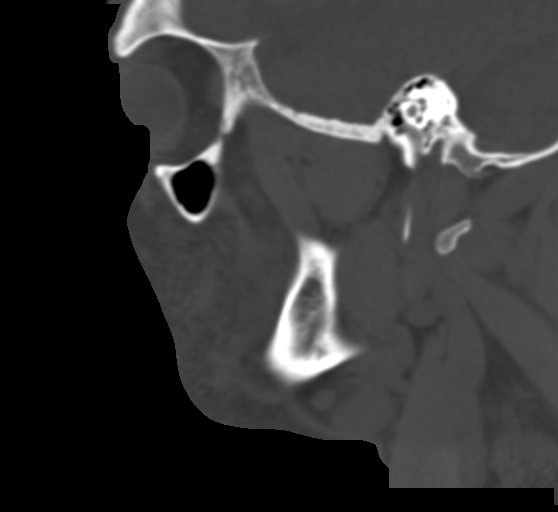

[Series 17: c_spine 2.0 st · axial · 0.34mm/px · z∈[-198,-134]mm · 2 of 96 slices shown, 3 images]
[im 32/96  soft-tissue]
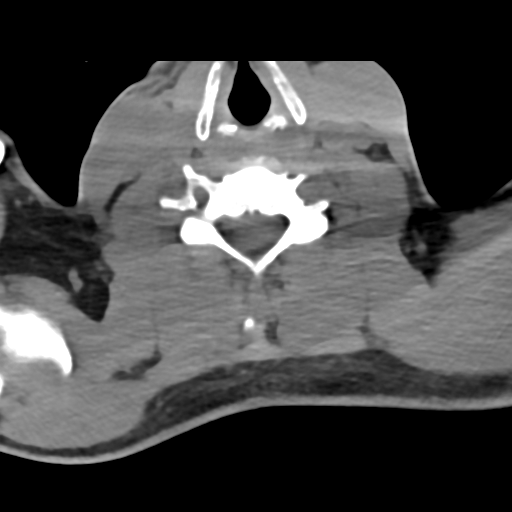
[im 32/96  bone]
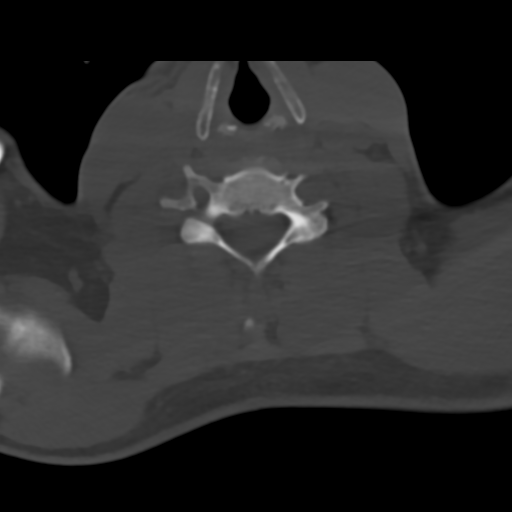
[im 64/96  bone]
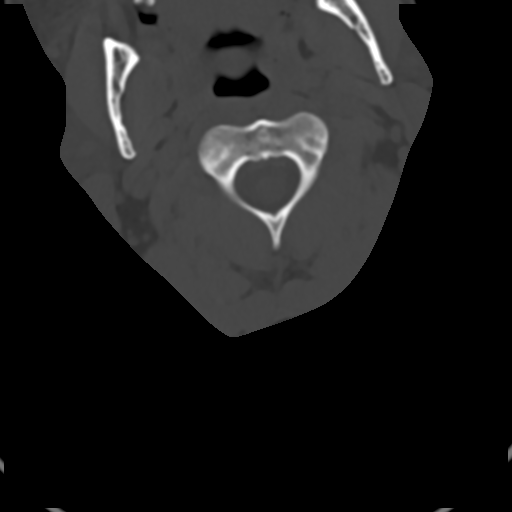

[5 of 33 positions shown; findings below may reference images not displayed]

FINDINGS: CT HEAD FINDINGS

Brain: Ventricles are stable in size and configuration. There is no
mass, hemorrhage, edema or other evidence of acute parenchymal
abnormality. No extra-axial hemorrhage.

Vascular: No hyperdense vessel or unexpected calcification.

Skull: Normal. Negative for fracture or focal lesion.

Other: None.

CT MAXILLOFACIAL FINDINGS

Osseous: Lower frontal bones are intact and normally aligned. No
displaced nasal bone fracture seen. Osseous structures about the
orbits are intact and normally aligned. Walls of the maxillary
sinuses are intact and normally aligned. Bilateral zygomatic arches
and pterygoid plates are intact. No mandible fracture or
displacement seen.

Multiple missing teeth. Lucencies at the bases of multiple remaining
maxillary and mandibular teeth raise the possibility of periodontal
abscesses.

Orbits: Negative. No traumatic or inflammatory finding.

Sinuses: Clear.

Soft tissues: Ill-defined edema within the subcutaneous soft tissues
overlying the maxilla and mandible, bilateral, most prominent along
the anterior margin of the lower mandible. No circumscribed soft
tissue hematoma seen.

CT CERVICAL SPINE FINDINGS

Alignment: Mild levoscoliosis. No evidence of acute vertebral body
subluxation.

Skull base and vertebrae: No fracture line or displaced fracture
fragment. Facet joints appear intact and normally aligned
throughout.

Soft tissues and spinal canal: No prevertebral fluid or swelling. No
visible canal hematoma.

Disc levels: Mild disc desiccations at multiple levels, but no more
than mild central canal stenosis at any level.

Upper chest: Presumed soft tissue edema within the subcutaneous soft
tissues of the upper chest

Other: None.
IMPRESSION: 1. No acute intracranial abnormality. No intracranial hemorrhage or
edema. No skull fracture.
2. No facial bone fracture or dislocation. Lucencies at the bases of
multiple maxillary and mandibular teeth raising the possibility of
periodontal abscesses.
3. Ill-defined edema within the subcutaneous soft tissues overlying
the bilateral maxilla and mandible, most prominent overlying the
anterior margin of the lower mandible, without circumscribed
hematoma.
4. No fracture or acute subluxation within the cervical spine.
5. Soft tissue thickening/nodularity within the subcutaneous soft
tissues of the upper chest, similar soft tissue mass/nodularity
demonstrated on earlier chest CT of 06/02/2015 suggesting benignity,
considerations including superficial varices or postsurgical change.

## 2020-06-02 IMAGING — CR DG CHEST 2V
2 series · 2 of 2 positions shown · non-contrast
Comparison: Chest x-ray dated 12/22/2017.

CLINICAL DATA: Seizure today.

EXAM:
CHEST - 2 VIEW

[chest lat]
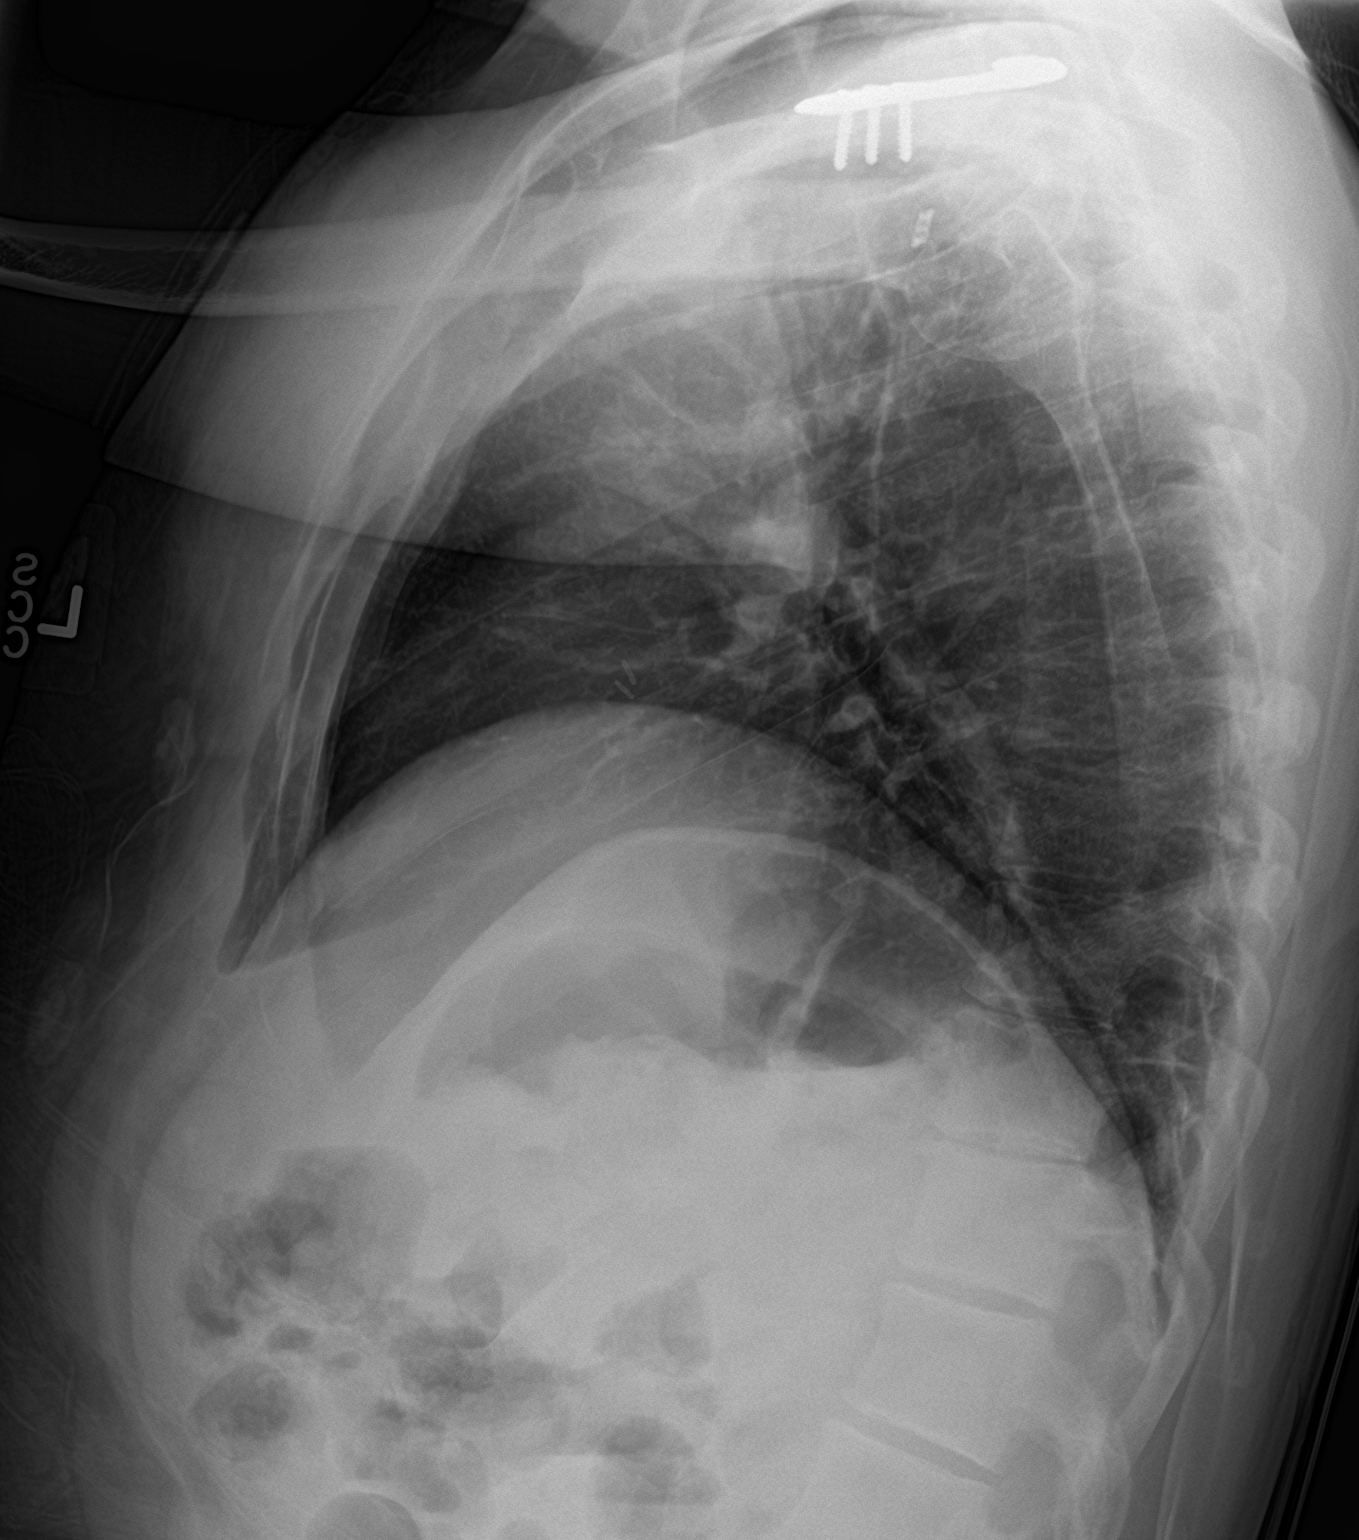

[chest ap]
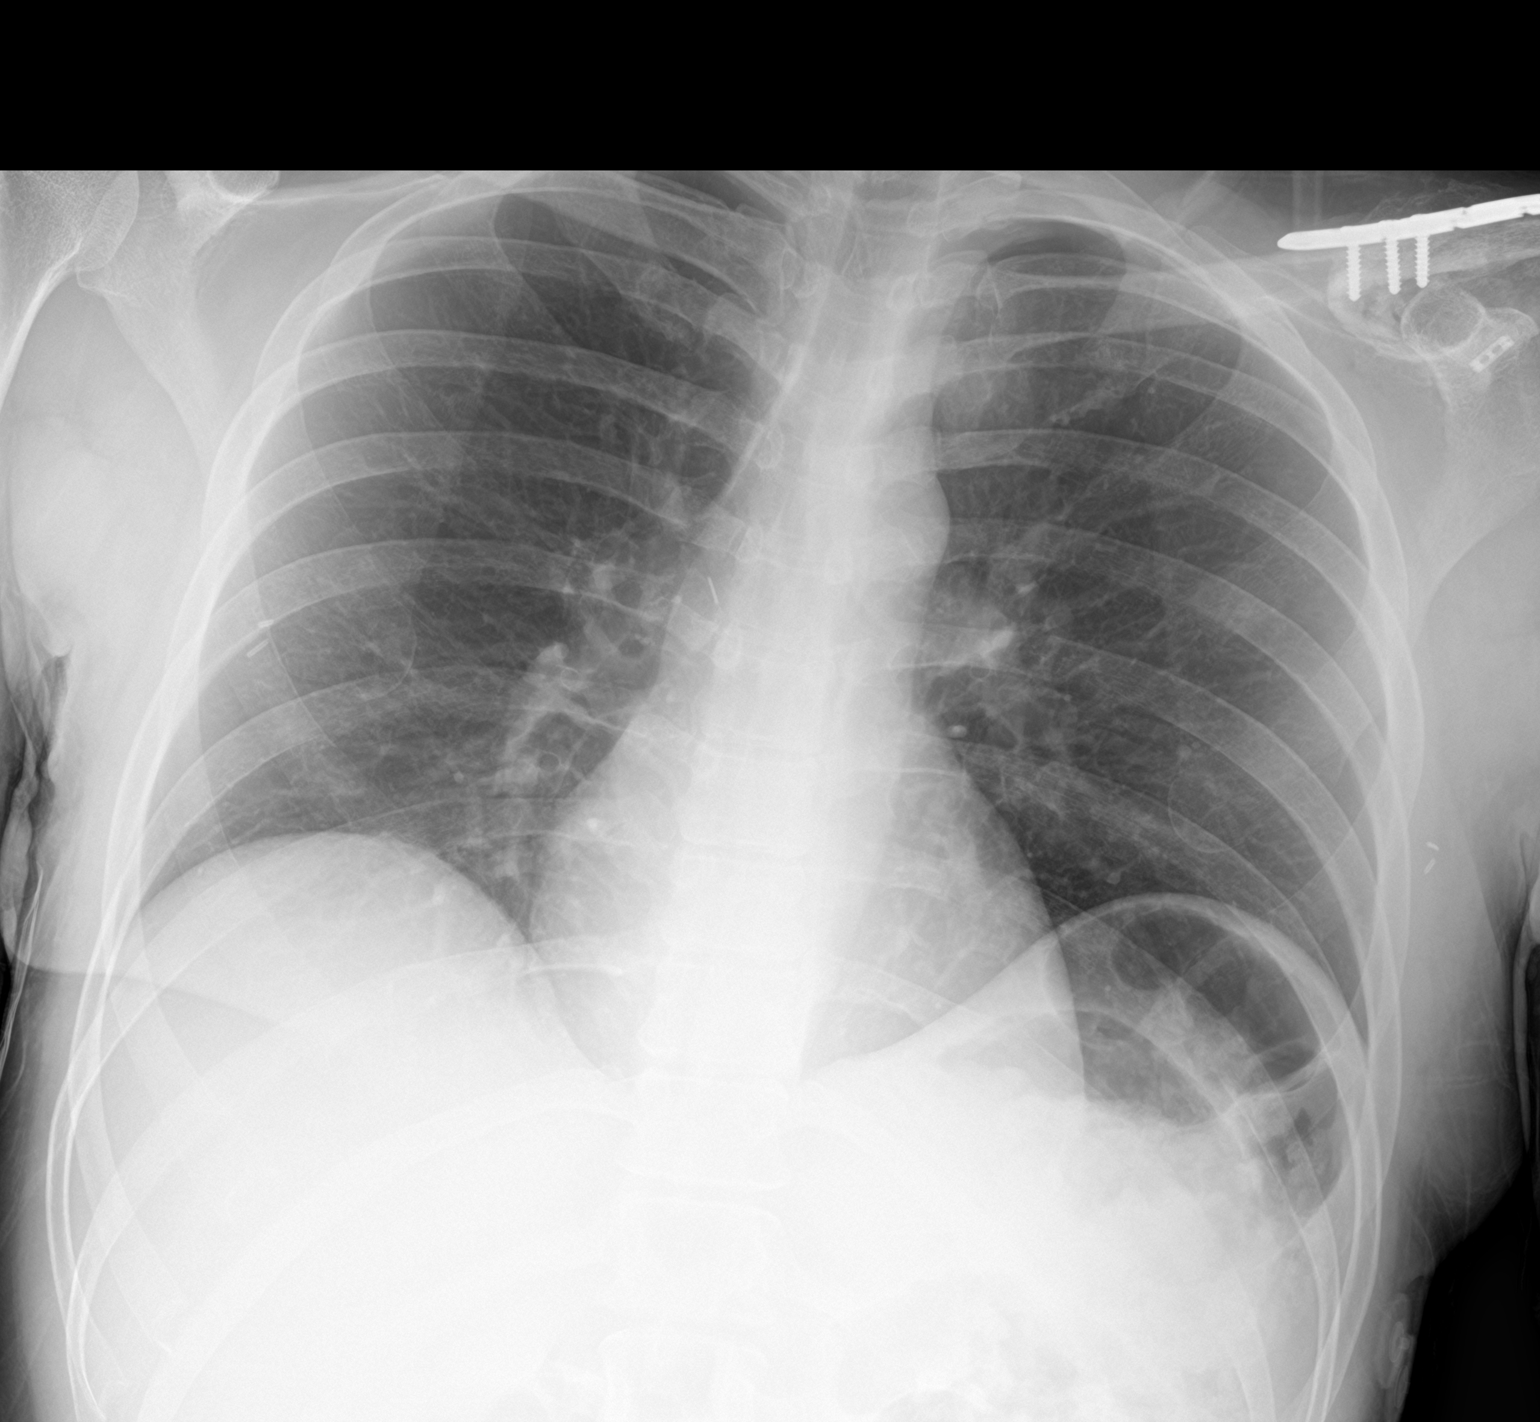

[2 of 2 positions shown; findings below may reference images not displayed]

FINDINGS: The heart size and mediastinal contours are within normal limits.
Both lungs are clear. The visualized skeletal structures are
unremarkable.
IMPRESSION: No active cardiopulmonary disease.

## 2020-06-05 ENCOUNTER — Encounter: Payer: Self-pay | Attending: Physical Medicine & Rehabilitation | Admitting: Physical Medicine & Rehabilitation

## 2020-06-05 ENCOUNTER — Other Ambulatory Visit: Payer: Self-pay

## 2020-06-05 ENCOUNTER — Encounter: Payer: Self-pay | Admitting: Physical Medicine & Rehabilitation

## 2020-06-05 VITALS — BP 110/71 | HR 89 | Temp 97.9°F | Ht 66.0 in | Wt 158.4 lb

## 2020-06-05 DIAGNOSIS — M25512 Pain in left shoulder: Secondary | ICD-10-CM | POA: Insufficient documentation

## 2020-06-05 DIAGNOSIS — G8929 Other chronic pain: Secondary | ICD-10-CM | POA: Insufficient documentation

## 2020-06-05 DIAGNOSIS — G540 Brachial plexus disorders: Secondary | ICD-10-CM | POA: Insufficient documentation

## 2020-06-05 DIAGNOSIS — R269 Unspecified abnormalities of gait and mobility: Secondary | ICD-10-CM | POA: Insufficient documentation

## 2020-06-05 MED ORDER — GABAPENTIN 300 MG PO CAPS: 900.0000 mg | ORAL_CAPSULE | Freq: Three times a day (TID) | ORAL | 1 refills | Status: DC
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
## 2020-06-17 ENCOUNTER — Other Ambulatory Visit: Payer: Self-pay

## 2020-06-17 ENCOUNTER — Emergency Department (HOSPITAL_COMMUNITY)
Admission: EM | Admit: 2020-06-17 | Discharge: 2020-06-17 | Disposition: A | Discharge status: Home / Self Care | Payer: Self-pay | Attending: Emergency Medicine | Admitting: Emergency Medicine

## 2020-06-17 ENCOUNTER — Encounter (HOSPITAL_COMMUNITY): Payer: Self-pay | Admitting: Emergency Medicine

## 2020-06-17 DIAGNOSIS — Z853 Personal history of malignant neoplasm of breast: Secondary | ICD-10-CM | POA: Insufficient documentation

## 2020-06-17 DIAGNOSIS — R569 Unspecified convulsions: Secondary | ICD-10-CM | POA: Insufficient documentation

## 2020-06-17 DIAGNOSIS — F1410 Cocaine abuse, uncomplicated: Secondary | ICD-10-CM | POA: Insufficient documentation

## 2020-06-17 DIAGNOSIS — I10 Essential (primary) hypertension: Secondary | ICD-10-CM | POA: Insufficient documentation

## 2020-06-17 DIAGNOSIS — Z79899 Other long term (current) drug therapy: Secondary | ICD-10-CM | POA: Insufficient documentation

## 2020-06-17 DIAGNOSIS — F17210 Nicotine dependence, cigarettes, uncomplicated: Secondary | ICD-10-CM | POA: Insufficient documentation

## 2020-06-17 LAB — CBC
HCT: 47.5 % (ref 39.0–52.0)
Hemoglobin: 16.1 g/dL (ref 13.0–17.0)
MCH: 28.8 pg (ref 26.0–34.0)
MCHC: 33.9 g/dL (ref 30.0–36.0)
MCV: 85 fL (ref 80.0–100.0)
Platelets: 171 K/uL (ref 150–400)
RBC: 5.59 MIL/uL (ref 4.22–5.81)
RDW: 13.4 % (ref 11.5–15.5)
WBC: 5.6 K/uL (ref 4.0–10.5)
nRBC: 0 % (ref 0.0–0.2)

## 2020-06-17 LAB — BASIC METABOLIC PANEL
Anion gap: 13 (ref 5–15)
BUN: 7 mg/dL (ref 6–20)
CO2: 23 mmol/L (ref 22–32)
Calcium: 9.1 mg/dL (ref 8.9–10.3)
Chloride: 101 mmol/L (ref 98–111)
Creatinine, Ser: 0.8 mg/dL (ref 0.61–1.24)
GFR, Estimated: >60 mL/min (ref >60)
Glucose, Bld: 164 mg/dL — ABNORMAL HIGH (ref 70–99)
Potassium: 3.5 mmol/L (ref 3.5–5.1)
Sodium: 137 mmol/L (ref 135–145)

## 2020-06-17 LAB — CBG MONITORING, ED: Glucose-Capillary: 146 mg/dL — ABNORMAL HIGH (ref 70–99)
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
## 2020-06-17 NOTE — Discharge Instructions (Addendum)
You are seen in the emergency department for an unresponsive episode, probably a seizure.  Your lab work did not show any significant abnormalities.  Please continue your regular medications and follow-up with your primary care doctor.  Return to the emergency department for any worsening or concerning symptoms.

## 2020-06-17 NOTE — ED Notes (Signed)
IV removed from Rt. Hand. 2X2 applied with tape.

## 2020-06-19 ENCOUNTER — Encounter (HOSPITAL_COMMUNITY): Payer: Self-pay | Admitting: Psychiatry

## 2020-06-19 ENCOUNTER — Ambulatory Visit (INDEPENDENT_AMBULATORY_CARE_PROVIDER_SITE_OTHER): Payer: No Payment, Other | Admitting: Psychiatry

## 2020-06-19 ENCOUNTER — Other Ambulatory Visit: Payer: Self-pay

## 2020-06-19 ENCOUNTER — Ambulatory Visit (HOSPITAL_COMMUNITY)
Admission: RE | Admit: 2020-06-19 | Discharge: 2020-06-19 | Disposition: A | Discharge status: Home / Self Care | Payer: Self-pay | Source: Ambulatory Visit | Attending: Physical Medicine & Rehabilitation | Admitting: Physical Medicine & Rehabilitation

## 2020-06-19 DIAGNOSIS — F333 Major depressive disorder, recurrent, severe with psychotic symptoms: Secondary | ICD-10-CM

## 2020-06-19 DIAGNOSIS — F411 Generalized anxiety disorder: Secondary | ICD-10-CM

## 2020-06-19 DIAGNOSIS — F3164 Bipolar disorder, current episode mixed, severe, with psychotic features: Secondary | ICD-10-CM | POA: Diagnosis not present

## 2020-06-19 DIAGNOSIS — G8929 Other chronic pain: Secondary | ICD-10-CM | POA: Insufficient documentation

## 2020-06-19 DIAGNOSIS — G540 Brachial plexus disorders: Secondary | ICD-10-CM | POA: Insufficient documentation

## 2020-06-19 DIAGNOSIS — M25512 Pain in left shoulder: Secondary | ICD-10-CM | POA: Insufficient documentation

## 2020-06-19 MED ORDER — TRAZODONE HCL 100 MG PO TABS: 100.0000 mg | ORAL_TABLET | Freq: Every evening | ORAL | 2 refills | Status: DC | PRN

## 2020-06-19 MED ORDER — BUSPIRONE HCL 10 MG PO TABS: 10.0000 mg | ORAL_TABLET | Freq: Three times a day (TID) | ORAL | 2 refills | Status: DC

## 2020-06-19 MED ORDER — HYDROXYZINE HCL 25 MG PO TABS: 25.0000 mg | ORAL_TABLET | Freq: Three times a day (TID) | ORAL | 2 refills | Status: DC | PRN

## 2020-06-19 MED ORDER — LAMOTRIGINE 25 MG PO TABS: 75.0000 mg | ORAL_TABLET | Freq: Every day | ORAL | 1 refills | Status: DC

## 2020-06-19 MED ORDER — QUETIAPINE FUMARATE 100 MG PO TABS: 100.0000 mg | ORAL_TABLET | Freq: Every day | ORAL | 2 refills | Status: DC
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
## 2020-06-30 ENCOUNTER — Encounter: Payer: Self-pay | Admitting: Internal Medicine

## 2020-06-30 ENCOUNTER — Ambulatory Visit (INDEPENDENT_AMBULATORY_CARE_PROVIDER_SITE_OTHER): Payer: Self-pay | Admitting: Internal Medicine

## 2020-06-30 ENCOUNTER — Other Ambulatory Visit: Payer: Self-pay

## 2020-06-30 DIAGNOSIS — I10 Essential (primary) hypertension: Secondary | ICD-10-CM

## 2020-06-30 DIAGNOSIS — G8921 Chronic pain due to trauma: Secondary | ICD-10-CM
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
## 2020-06-30 NOTE — Assessment & Plan Note (Signed)
Patient presents with shoulder pain that comes and goes, which is chronic in nature. He states that he is happy with being able to see PM&R, and has a follow up appointment with his physician on 07/03/20. Encouraged patient to follow up with PM&R. Patient voiced understanding.  - Follow up with PM&R for chronic pain.

## 2020-06-30 NOTE — Patient Instructions (Addendum)
To Mr. Ebron,   It was good to see you today! Today we discussed your shoulder pain and blood pressure. For your shoulder pain, please follow up with your pain management physician on the December 16th. Your blood pressure is doing well on your amlodipine, so we will continue your blood pressure medication. Additionally, please continue to follow with Behavioral Health. I hope you have a happy holiday season! I will see you in 6 months time for a blood pressure check!  Sincerely,  Maudie Mercury, MD

## 2020-06-30 NOTE — Assessment & Plan Note (Signed)
Patient currently taking amlodipine 10 mg for his blood pressure. Is adherent to his medications, and reports no side effects. BP today is:   Vitals with BMI 06/30/2020 06/19/2020 06/17/2020  Height 5\' 6"  5\' 6"  -  Weight 159 lbs 10 oz 163 lbs -  BMI 14.15 97.33 -  Systolic 125 087 199  Diastolic 87 81 82  Pulse 97 87 81    Patient is doing well on current regimen. Will FU in 6 months.   - Continue Amlodipine 10 mg daily  - FU in 6 months.

## 2020-07-02 NOTE — Progress Notes (Signed)
Internal Medicine Clinic Attending ? ?Case discussed with Dr. Winters  At the time of the visit.  We reviewed the resident?s history and exam and pertinent patient test results.  I agree with the assessment, diagnosis, and plan of care documented in the resident?s note.  ?

## 2020-07-03 ENCOUNTER — Encounter: Payer: Self-pay | Admitting: Physical Medicine & Rehabilitation

## 2020-07-03 ENCOUNTER — Encounter: Payer: Self-pay | Attending: Physical Medicine & Rehabilitation | Admitting: Physical Medicine & Rehabilitation

## 2020-07-03 ENCOUNTER — Other Ambulatory Visit: Payer: Self-pay

## 2020-07-03 VITALS — BP 115/68 | HR 94 | Temp 98.5°F | Ht 66.0 in | Wt 159.4 lb

## 2020-07-03 DIAGNOSIS — G8929 Other chronic pain: Secondary | ICD-10-CM | POA: Insufficient documentation

## 2020-07-03 DIAGNOSIS — G479 Sleep disorder, unspecified: Secondary | ICD-10-CM | POA: Insufficient documentation

## 2020-07-03 DIAGNOSIS — M25512 Pain in left shoulder: Secondary | ICD-10-CM | POA: Insufficient documentation

## 2020-07-03 MED ORDER — AMITRIPTYLINE HCL 10 MG PO TABS: 10.0000 mg | ORAL_TABLET | Freq: Every day | ORAL | 1 refills | Status: DC
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
## 2020-07-24 ENCOUNTER — Telehealth (HOSPITAL_COMMUNITY): Payer: No Payment, Other | Admitting: Psychiatry

## 2020-07-24 ENCOUNTER — Other Ambulatory Visit: Payer: Self-pay

## 2020-07-31 ENCOUNTER — Encounter: Payer: Self-pay | Attending: Physical Medicine & Rehabilitation | Admitting: Physical Medicine & Rehabilitation

## 2020-07-31 DIAGNOSIS — R269 Unspecified abnormalities of gait and mobility: Secondary | ICD-10-CM | POA: Insufficient documentation

## 2020-07-31 DIAGNOSIS — G479 Sleep disorder, unspecified: Secondary | ICD-10-CM | POA: Insufficient documentation

## 2020-07-31 DIAGNOSIS — G8929 Other chronic pain: Secondary | ICD-10-CM | POA: Insufficient documentation

## 2020-07-31 DIAGNOSIS — M25512 Pain in left shoulder: Secondary | ICD-10-CM | POA: Insufficient documentation

## 2020-08-01 ENCOUNTER — Other Ambulatory Visit: Payer: Self-pay | Admitting: Physical Medicine & Rehabilitation

## 2020-08-01 DIAGNOSIS — G8929 Other chronic pain: Secondary | ICD-10-CM

## 2020-08-14 ENCOUNTER — Encounter: Payer: Self-pay | Admitting: Physical Medicine & Rehabilitation

## 2020-08-14 ENCOUNTER — Encounter (HOSPITAL_BASED_OUTPATIENT_CLINIC_OR_DEPARTMENT_OTHER): Payer: No Typology Code available for payment source | Admitting: Physical Medicine & Rehabilitation

## 2020-08-14 ENCOUNTER — Ambulatory Visit (HOSPITAL_COMMUNITY)
Admission: RE | Admit: 2020-08-14 | Discharge: 2020-08-14 | Disposition: A | Discharge status: Home / Self Care | Payer: Self-pay | Source: Ambulatory Visit | Attending: Physical Medicine & Rehabilitation | Admitting: Physical Medicine & Rehabilitation

## 2020-08-14 ENCOUNTER — Other Ambulatory Visit: Payer: Self-pay

## 2020-08-14 VITALS — BP 127/73 | HR 98 | Temp 98.5°F | Ht 66.0 in | Wt 166.2 lb

## 2020-08-14 DIAGNOSIS — M25512 Pain in left shoulder: Secondary | ICD-10-CM | POA: Insufficient documentation

## 2020-08-14 DIAGNOSIS — G8929 Other chronic pain: Secondary | ICD-10-CM | POA: Insufficient documentation

## 2020-08-14 DIAGNOSIS — G479 Sleep disorder, unspecified: Secondary | ICD-10-CM

## 2020-08-14 DIAGNOSIS — R269 Unspecified abnormalities of gait and mobility: Secondary | ICD-10-CM | POA: Insufficient documentation

## 2020-08-14 MED ORDER — DULOXETINE HCL 30 MG PO CPEP: 30.0000 mg | ORAL_CAPSULE | Freq: Every day | ORAL | 1 refills | Status: DC
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
## 2020-08-15 ENCOUNTER — Ambulatory Visit (HOSPITAL_COMMUNITY): Payer: No Payment, Other | Admitting: Licensed Clinical Social Worker

## 2020-08-15 DIAGNOSIS — F333 Major depressive disorder, recurrent, severe with psychotic symptoms: Secondary | ICD-10-CM

## 2020-08-15 NOTE — Progress Notes (Signed)
Pt came in today for scheduled therapy appointment. Pt has not been seen since August for therapy. LCSW inquired about this. Pt stated that he thought this was for medication mgmt. LCSW provided education on therapy, but pt declined at this time. LCSW spoke with pt about re scheduling with Burt Ek NP. [name] was agreeable to that. LCSW also had questions about medications and pt was directed to nursing line. He inquired about refills and LCSW directed pt to pharmacy first so they can request refills. LCSW left therapy open for pt to come back if he wanted. [name] stated "If I ever have really big problem I am going to come to you".

## 2020-08-18 ENCOUNTER — Telehealth (HOSPITAL_COMMUNITY): Payer: Self-pay | Admitting: Psychiatry

## 2020-08-18 NOTE — Telephone Encounter (Signed)
Patient can call and rescheldule an appointment. He is also free to walk in during providers walk-in hours (Monday and Tuesday 8:00-10:00 am.)

## 2020-08-19 ENCOUNTER — Ambulatory Visit: Payer: Self-pay

## 2020-08-20 ENCOUNTER — Emergency Department (HOSPITAL_COMMUNITY)
Admission: EM | Admit: 2020-08-20 | Discharge: 2020-08-20 | Disposition: A | Discharge status: Home / Self Care | Payer: No Typology Code available for payment source | Attending: Emergency Medicine | Admitting: Emergency Medicine

## 2020-08-20 ENCOUNTER — Other Ambulatory Visit: Payer: Self-pay

## 2020-08-20 ENCOUNTER — Encounter (HOSPITAL_COMMUNITY): Payer: Self-pay | Admitting: Emergency Medicine

## 2020-08-20 DIAGNOSIS — I10 Essential (primary) hypertension: Secondary | ICD-10-CM | POA: Insufficient documentation

## 2020-08-20 DIAGNOSIS — Z85828 Personal history of other malignant neoplasm of skin: Secondary | ICD-10-CM | POA: Insufficient documentation

## 2020-08-20 DIAGNOSIS — Z8616 Personal history of COVID-19: Secondary | ICD-10-CM | POA: Insufficient documentation

## 2020-08-20 DIAGNOSIS — F17210 Nicotine dependence, cigarettes, uncomplicated: Secondary | ICD-10-CM | POA: Insufficient documentation

## 2020-08-20 DIAGNOSIS — I25119 Atherosclerotic heart disease of native coronary artery with unspecified angina pectoris: Secondary | ICD-10-CM | POA: Insufficient documentation

## 2020-08-20 DIAGNOSIS — R112 Nausea with vomiting, unspecified: Secondary | ICD-10-CM | POA: Insufficient documentation

## 2020-08-20 DIAGNOSIS — R197 Diarrhea, unspecified: Secondary | ICD-10-CM | POA: Insufficient documentation

## 2020-08-20 LAB — CBC WITH DIFFERENTIAL/PLATELET
Abs Immature Granulocytes: 0.06 10*3/uL (ref 0.00–0.07)
Basophils Absolute: 0 10*3/uL (ref 0.0–0.1)
Basophils Relative: 0 %
Eosinophils Absolute: 0 10*3/uL (ref 0.0–0.5)
Eosinophils Relative: 0 %
HCT: 46.1 % (ref 39.0–52.0)
Hemoglobin: 15.6 g/dL (ref 13.0–17.0)
Immature Granulocytes: 1 %
Lymphocytes Relative: 6 %
Lymphs Abs: 0.6 10*3/uL — ABNORMAL LOW (ref 0.7–4.0)
MCH: 29.9 pg (ref 26.0–34.0)
MCHC: 33.8 g/dL (ref 30.0–36.0)
MCV: 88.3 fL (ref 80.0–100.0)
Monocytes Absolute: 0.4 10*3/uL (ref 0.1–1.0)
Monocytes Relative: 4 %
Neutro Abs: 8.9 10*3/uL — ABNORMAL HIGH (ref 1.7–7.7)
Neutrophils Relative %: 89 %
Platelets: 205 10*3/uL (ref 150–400)
RBC: 5.22 MIL/uL (ref 4.22–5.81)
RDW: 14.5 % (ref 11.5–15.5)
WBC: 10 10*3/uL (ref 4.0–10.5)
nRBC: 0 % (ref 0.0–0.2)

## 2020-08-20 LAB — COMPREHENSIVE METABOLIC PANEL
ALT: 23 U/L (ref 0–44)
AST: 38 U/L (ref 15–41)
Albumin: 4.9 g/dL (ref 3.5–5.0)
Alkaline Phosphatase: 56 U/L (ref 38–126)
Anion gap: 11 (ref 5–15)
BUN: 15 mg/dL (ref 6–20)
CO2: 28 mmol/L (ref 22–32)
Calcium: 9.8 mg/dL (ref 8.9–10.3)
Chloride: 101 mmol/L (ref 98–111)
Creatinine, Ser: 0.88 mg/dL (ref 0.61–1.24)
GFR, Estimated: >60 mL/min (ref >60)
Glucose, Bld: 111 mg/dL — ABNORMAL HIGH (ref 70–99)
Potassium: 4 mmol/L (ref 3.5–5.1)
Sodium: 140 mmol/L (ref 135–145)
Total Bilirubin: 0.7 mg/dL (ref 0.3–1.2)
Total Protein: 8.8 g/dL — ABNORMAL HIGH (ref 6.5–8.1)

## 2020-08-20 LAB — RAPID URINE DRUG SCREEN, HOSP PERFORMED
Amphetamines: NOT DETECTED
Barbiturates: NOT DETECTED
Benzodiazepines: NOT DETECTED
Cocaine: POSITIVE — AB
Opiates: NOT DETECTED
Tetrahydrocannabinol: NOT DETECTED

## 2020-08-20 LAB — URINALYSIS, ROUTINE W REFLEX MICROSCOPIC
Bacteria, UA: NONE SEEN
Bilirubin Urine: NEGATIVE
Glucose, UA: NEGATIVE mg/dL
Hgb urine dipstick: NEGATIVE
Ketones, ur: NEGATIVE mg/dL
Leukocytes,Ua: NEGATIVE
Nitrite: NEGATIVE
Protein, ur: 30 mg/dL — AB
Specific Gravity, Urine: 1.018 (ref 1.005–1.030)
pH: 9 — ABNORMAL HIGH (ref 5.0–8.0)

## 2020-08-20 LAB — SALICYLATE LEVEL: Salicylate Lvl: <7 mg/dL — ABNORMAL LOW (ref 7.0–30.0)

## 2020-08-20 LAB — LIPASE, BLOOD: Lipase: 20 U/L (ref 11–51)

## 2020-08-20 LAB — ETHANOL: Alcohol, Ethyl (B): <10 mg/dL (ref <10)

## 2020-08-20 MED ORDER — SODIUM CHLORIDE 0.9 % IV BOLUS
1000.0000 mL | Freq: Once | INTRAVENOUS | Status: AC
  Administered 2020-08-20: 1000 mL via INTRAVENOUS

## 2020-08-20 MED ORDER — ONDANSETRON HCL 4 MG/2ML IJ SOLN
4.0000 mg | Freq: Once | INTRAMUSCULAR | Status: AC
  Administered 2020-08-20: 4 mg via INTRAVENOUS
  Filled 2020-08-20: qty 2

## 2020-08-20 MED ORDER — ONDANSETRON HCL 4 MG PO TABS: 4.0000 mg | ORAL_TABLET | Freq: Three times a day (TID) | ORAL | 0 refills | Status: DC | PRN

## 2020-08-20 MED ORDER — LIDOCAINE VISCOUS HCL 2 % MT SOLN
15.0000 mL | Freq: Once | OROMUCOSAL | Status: AC
  Administered 2020-08-20: 15 mL via ORAL
  Filled 2020-08-20: qty 15

## 2020-08-20 MED ORDER — DICYCLOMINE HCL 10 MG/ML IM SOLN
20.0000 mg | Freq: Once | INTRAMUSCULAR | Status: AC
  Administered 2020-08-20: 20 mg via INTRAMUSCULAR
  Filled 2020-08-20: qty 2

## 2020-08-20 MED ORDER — ALUM & MAG HYDROXIDE-SIMETH 200-200-20 MG/5ML PO SUSP
30.0000 mL | Freq: Once | ORAL | Status: AC
  Administered 2020-08-20: 30 mL via ORAL
  Filled 2020-08-20: qty 30

## 2020-08-20 NOTE — ED Notes (Signed)
Attempted IV access x2, this RN and another RN attempted, unsuccessful. IV team consult placed.
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
## 2020-08-20 NOTE — ED Notes (Signed)
Pt verbalized dc instructions and follow up care. Alert and ambulatory. No iv. 
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
## 2020-09-15 ENCOUNTER — Other Ambulatory Visit: Payer: Self-pay

## 2020-09-15 ENCOUNTER — Encounter: Payer: Self-pay | Admitting: Physical Medicine & Rehabilitation

## 2020-09-15 ENCOUNTER — Encounter: Payer: Self-pay | Attending: Physical Medicine & Rehabilitation | Admitting: Physical Medicine & Rehabilitation

## 2020-09-15 VITALS — BP 124/65 | HR 105 | Temp 98.8°F | Ht 66.0 in | Wt 162.0 lb

## 2020-09-15 DIAGNOSIS — M79622 Pain in left upper arm: Secondary | ICD-10-CM | POA: Insufficient documentation

## 2020-09-15 DIAGNOSIS — R208 Other disturbances of skin sensation: Secondary | ICD-10-CM | POA: Insufficient documentation

## 2020-09-15 NOTE — Progress Notes (Signed)
Please see media tab for full results 

## 2020-09-18 ENCOUNTER — Encounter (HOSPITAL_COMMUNITY): Payer: No Payment, Other | Admitting: Psychiatry

## 2020-09-18 ENCOUNTER — Ambulatory Visit: Payer: Self-pay

## 2020-10-08 ENCOUNTER — Encounter: Payer: Self-pay | Admitting: Internal Medicine

## 2020-10-08 ENCOUNTER — Telehealth: Payer: Self-pay | Admitting: *Deleted

## 2020-10-08 NOTE — Telephone Encounter (Signed)
Call to pt to rescheduled appt he missed today with [facility]-no answer, message left on recorder to call back to reschedule appt if needed.Regenia Skeeter, Lehman Whiteley Cassady3/23/20223:28 PM

## 2020-10-10 ENCOUNTER — Other Ambulatory Visit: Payer: Self-pay

## 2020-10-10 ENCOUNTER — Emergency Department (HOSPITAL_COMMUNITY)
Admission: EM | Admit: 2020-10-10 | Discharge: 2020-10-10 | Disposition: A | Discharge status: Home / Self Care | Payer: Self-pay | Attending: Emergency Medicine | Admitting: Emergency Medicine

## 2020-10-10 ENCOUNTER — Encounter (HOSPITAL_COMMUNITY): Payer: Self-pay

## 2020-10-10 DIAGNOSIS — R519 Headache, unspecified: Secondary | ICD-10-CM | POA: Insufficient documentation

## 2020-10-10 DIAGNOSIS — R197 Diarrhea, unspecified: Secondary | ICD-10-CM | POA: Insufficient documentation

## 2020-10-10 DIAGNOSIS — R112 Nausea with vomiting, unspecified: Secondary | ICD-10-CM | POA: Insufficient documentation

## 2020-10-10 DIAGNOSIS — Z5321 Procedure and treatment not carried out due to patient leaving prior to being seen by health care provider: Secondary | ICD-10-CM | POA: Insufficient documentation

## 2020-10-10 DIAGNOSIS — R195 Other fecal abnormalities: Secondary | ICD-10-CM | POA: Insufficient documentation

## 2020-10-10 LAB — CBC
HCT: 52.8 % — ABNORMAL HIGH (ref 39.0–52.0)
Hemoglobin: 18.1 g/dL — ABNORMAL HIGH (ref 13.0–17.0)
MCH: 30.1 pg (ref 26.0–34.0)
MCHC: 34.3 g/dL (ref 30.0–36.0)
MCV: 87.9 fL (ref 80.0–100.0)
Platelets: 224 10*3/uL (ref 150–400)
RBC: 6.01 MIL/uL — ABNORMAL HIGH (ref 4.22–5.81)
RDW: 14.8 % (ref 11.5–15.5)
WBC: 5.8 10*3/uL (ref 4.0–10.5)
nRBC: 0 % (ref 0.0–0.2)

## 2020-10-10 LAB — COMPREHENSIVE METABOLIC PANEL
ALT: 216 U/L — ABNORMAL HIGH (ref 0–44)
AST: 390 U/L — ABNORMAL HIGH (ref 15–41)
Albumin: 5 g/dL (ref 3.5–5.0)
Alkaline Phosphatase: 65 U/L (ref 38–126)
Anion gap: 20 — ABNORMAL HIGH (ref 5–15)
BUN: 12 mg/dL (ref 6–20)
CO2: 18 mmol/L — ABNORMAL LOW (ref 22–32)
Calcium: 9.2 mg/dL (ref 8.9–10.3)
Chloride: 100 mmol/L (ref 98–111)
Creatinine, Ser: 1.06 mg/dL (ref 0.61–1.24)
GFR, Estimated: >60 mL/min (ref >60)
Glucose, Bld: 82 mg/dL (ref 70–99)
Potassium: 4.3 mmol/L (ref 3.5–5.1)
Sodium: 138 mmol/L (ref 135–145)
Total Bilirubin: 1 mg/dL (ref 0.3–1.2)
Total Protein: 8.8 g/dL — ABNORMAL HIGH (ref 6.5–8.1)

## 2020-10-10 LAB — TYPE AND SCREEN
ABO/RH(D): O POS
Antibody Screen: NEGATIVE

## 2020-10-10 NOTE — ED Notes (Signed)
Could not find pt to draw his blood work

## 2020-10-10 NOTE — ED Notes (Signed)
No answer from lobby  

## 2020-10-10 NOTE — ED Triage Notes (Signed)
Patient c/o headache x 2 days. Patient states he had had N/v/D x 3 days and states that he has seen dark red blood 3-4 times in his stool,  Patient states he has been drinking 2-3 glasses of wine in the past week.

## 2020-10-10 NOTE — ED Notes (Signed)
Per screener, patient reports he is leaving.

## 2020-10-14 ENCOUNTER — Other Ambulatory Visit: Payer: Self-pay | Admitting: Physical Medicine & Rehabilitation

## 2020-10-14 DIAGNOSIS — G8929 Other chronic pain: Secondary | ICD-10-CM

## 2020-10-15 ENCOUNTER — Emergency Department (HOSPITAL_COMMUNITY)
Admission: EM | Admit: 2020-10-15 | Discharge: 2020-10-15 | Disposition: A | Discharge status: Home / Self Care | Payer: Self-pay | Attending: Emergency Medicine | Admitting: Emergency Medicine

## 2020-10-15 DIAGNOSIS — Z853 Personal history of malignant neoplasm of breast: Secondary | ICD-10-CM | POA: Insufficient documentation

## 2020-10-15 DIAGNOSIS — R45851 Suicidal ideations: Secondary | ICD-10-CM | POA: Insufficient documentation

## 2020-10-15 DIAGNOSIS — Z20822 Contact with and (suspected) exposure to covid-19: Secondary | ICD-10-CM | POA: Insufficient documentation

## 2020-10-15 DIAGNOSIS — R000 Tachycardia, unspecified: Secondary | ICD-10-CM | POA: Insufficient documentation

## 2020-10-15 DIAGNOSIS — I10 Essential (primary) hypertension: Secondary | ICD-10-CM | POA: Insufficient documentation

## 2020-10-15 DIAGNOSIS — Z8616 Personal history of COVID-19: Secondary | ICD-10-CM | POA: Insufficient documentation

## 2020-10-15 DIAGNOSIS — F1010 Alcohol abuse, uncomplicated: Secondary | ICD-10-CM

## 2020-10-15 DIAGNOSIS — K7010 Alcoholic hepatitis without ascites: Secondary | ICD-10-CM

## 2020-10-15 DIAGNOSIS — Z85528 Personal history of other malignant neoplasm of kidney: Secondary | ICD-10-CM | POA: Insufficient documentation

## 2020-10-15 DIAGNOSIS — F17210 Nicotine dependence, cigarettes, uncomplicated: Secondary | ICD-10-CM | POA: Insufficient documentation

## 2020-10-15 DIAGNOSIS — I2510 Atherosclerotic heart disease of native coronary artery without angina pectoris: Secondary | ICD-10-CM | POA: Insufficient documentation

## 2020-10-15 DIAGNOSIS — K921 Melena: Secondary | ICD-10-CM | POA: Insufficient documentation

## 2020-10-15 DIAGNOSIS — Z79899 Other long term (current) drug therapy: Secondary | ICD-10-CM | POA: Insufficient documentation

## 2020-10-15 DIAGNOSIS — F10129 Alcohol abuse with intoxication, unspecified: Secondary | ICD-10-CM | POA: Insufficient documentation

## 2020-10-15 LAB — LIPASE, BLOOD: Lipase: 30 U/L (ref 11–51)

## 2020-10-15 LAB — CBC WITH DIFFERENTIAL/PLATELET
Abs Immature Granulocytes: 0.02 10*3/uL (ref 0.00–0.07)
Basophils Absolute: 0 10*3/uL (ref 0.0–0.1)
Basophils Relative: 1 %
Eosinophils Absolute: 0 10*3/uL (ref 0.0–0.5)
Eosinophils Relative: 0 %
HCT: 50 % (ref 39.0–52.0)
Hemoglobin: 18.1 g/dL — ABNORMAL HIGH (ref 13.0–17.0)
Immature Granulocytes: 0 %
Lymphocytes Relative: 46 %
Lymphs Abs: 2 10*3/uL (ref 0.7–4.0)
MCH: 30.7 pg (ref 26.0–34.0)
MCHC: 36.2 g/dL — ABNORMAL HIGH (ref 30.0–36.0)
MCV: 84.7 fL (ref 80.0–100.0)
Monocytes Absolute: 0.6 10*3/uL (ref 0.1–1.0)
Monocytes Relative: 13 %
Neutro Abs: 1.8 10*3/uL (ref 1.7–7.7)
Neutrophils Relative %: 40 %
Platelets: 109 10*3/uL — ABNORMAL LOW (ref 150–400)
RBC: 5.9 MIL/uL — ABNORMAL HIGH (ref 4.22–5.81)
RDW: 13.3 % (ref 11.5–15.5)
WBC: 4.5 10*3/uL (ref 4.0–10.5)
nRBC: 0 % (ref 0.0–0.2)

## 2020-10-15 LAB — RESP PANEL BY RT-PCR (FLU A&B, COVID) ARPGX2
Influenza A by PCR: NEGATIVE
Influenza B by PCR: NEGATIVE
SARS Coronavirus 2 by RT PCR: NEGATIVE

## 2020-10-15 LAB — COMPREHENSIVE METABOLIC PANEL
ALT: 717 U/L — ABNORMAL HIGH (ref 0–44)
AST: 1139 U/L — ABNORMAL HIGH (ref 15–41)
Albumin: 4.3 g/dL (ref 3.5–5.0)
Alkaline Phosphatase: 62 U/L (ref 38–126)
Anion gap: 17 — ABNORMAL HIGH (ref 5–15)
BUN: 8 mg/dL (ref 6–20)
CO2: 28 mmol/L (ref 22–32)
Calcium: 9.2 mg/dL (ref 8.9–10.3)
Chloride: 91 mmol/L — ABNORMAL LOW (ref 98–111)
Creatinine, Ser: 1.09 mg/dL (ref 0.61–1.24)
GFR, Estimated: >60 mL/min (ref >60)
Glucose, Bld: 99 mg/dL (ref 70–99)
Potassium: 3.9 mmol/L (ref 3.5–5.1)
Sodium: 136 mmol/L (ref 135–145)
Total Bilirubin: 1.1 mg/dL (ref 0.3–1.2)
Total Protein: 7.7 g/dL (ref 6.5–8.1)

## 2020-10-15 LAB — POC OCCULT BLOOD, ED: Fecal Occult Bld: NEGATIVE

## 2020-10-15 LAB — ETHANOL: Alcohol, Ethyl (B): 220 mg/dL — ABNORMAL HIGH (ref <10)

## 2020-10-15 MED ORDER — SODIUM CHLORIDE 0.9 % IV BOLUS
1000.0000 mL | Freq: Once | INTRAVENOUS | Status: AC
  Administered 2020-10-15: 1000 mL via INTRAVENOUS

## 2020-10-15 MED ORDER — QUETIAPINE FUMARATE 50 MG PO TABS
100.0000 mg | ORAL_TABLET | Freq: Every day | ORAL | Status: DC
Start: 2020-10-15 — End: 2020-10-16
  Administered 2020-10-15: 100 mg via ORAL
  Filled 2020-10-15: qty 2
  Filled 2020-10-15: qty 1

## 2020-10-15 MED ORDER — LORAZEPAM 2 MG/ML IJ SOLN: 0.0000 mg | Freq: Four times a day (QID) | INTRAMUSCULAR | Status: DC

## 2020-10-15 MED ORDER — LORAZEPAM 1 MG PO TABS: 0.0000 mg | ORAL_TABLET | Freq: Two times a day (BID) | ORAL | Status: DC

## 2020-10-15 MED ORDER — LORAZEPAM 1 MG PO TABS
0.0000 mg | ORAL_TABLET | Freq: Four times a day (QID) | ORAL | Status: DC
  Administered 2020-10-15: 1 mg via ORAL
  Administered 2020-10-15: 2 mg via ORAL
  Filled 2020-10-15: qty 2
  Filled 2020-10-15: qty 1

## 2020-10-15 MED ORDER — BUSPIRONE HCL 10 MG PO TABS
10.0000 mg | ORAL_TABLET | Freq: Three times a day (TID) | ORAL | Status: DC
  Administered 2020-10-15 (×2): 10 mg via ORAL
  Filled 2020-10-15 (×2): qty 1

## 2020-10-15 MED ORDER — HALOPERIDOL LACTATE 5 MG/ML IJ SOLN
5.0000 mg | Freq: Once | INTRAMUSCULAR | Status: AC
  Administered 2020-10-15: 5 mg via INTRAVENOUS
  Filled 2020-10-15: qty 1

## 2020-10-15 MED ORDER — ONDANSETRON HCL 4 MG/2ML IJ SOLN
INTRAMUSCULAR | Status: AC
  Administered 2020-10-15: 4 mg via INTRAVENOUS
  Filled 2020-10-15: qty 2

## 2020-10-15 MED ORDER — THIAMINE HCL 100 MG/ML IJ SOLN: 100.0000 mg | Freq: Every day | INTRAMUSCULAR | Status: DC

## 2020-10-15 MED ORDER — THIAMINE HCL 100 MG PO TABS
100.0000 mg | ORAL_TABLET | Freq: Every day | ORAL | Status: DC
  Administered 2020-10-15: 100 mg via ORAL
  Filled 2020-10-15: qty 1

## 2020-10-15 MED ORDER — ZOLPIDEM TARTRATE 5 MG PO TABS: 5.0000 mg | ORAL_TABLET | Freq: Every evening | ORAL | Status: DC | PRN

## 2020-10-15 MED ORDER — HYDROXYZINE HCL 25 MG PO TABS: 25.0000 mg | ORAL_TABLET | Freq: Three times a day (TID) | ORAL | Status: DC | PRN

## 2020-10-15 MED ORDER — ONDANSETRON HCL 4 MG/2ML IJ SOLN: 4.0000 mg | Freq: Once | INTRAMUSCULAR | Status: AC

## 2020-10-15 MED ORDER — NICOTINE 21 MG/24HR TD PT24
21.0000 mg | MEDICATED_PATCH | Freq: Every day | TRANSDERMAL | Status: DC
  Filled 2020-10-15: qty 1

## 2020-10-15 MED ORDER — LAMOTRIGINE 25 MG PO TABS
75.0000 mg | ORAL_TABLET | Freq: Every day | ORAL | Status: DC
Start: 2020-10-15 — End: 2020-10-16
  Administered 2020-10-15: 75 mg via ORAL
  Filled 2020-10-15 (×2): qty 3

## 2020-10-15 MED ORDER — PANTOPRAZOLE SODIUM 40 MG IV SOLR
40.0000 mg | Freq: Once | INTRAVENOUS | Status: AC
  Administered 2020-10-15: 40 mg via INTRAVENOUS
  Filled 2020-10-15: qty 40

## 2020-10-15 MED ORDER — ALUM & MAG HYDROXIDE-SIMETH 200-200-20 MG/5ML PO SUSP: 30.0000 mL | Freq: Four times a day (QID) | ORAL | Status: DC | PRN

## 2020-10-15 MED ORDER — DULOXETINE HCL 30 MG PO CPEP
30.0000 mg | ORAL_CAPSULE | Freq: Every day | ORAL | Status: DC
  Administered 2020-10-15: 30 mg via ORAL
  Filled 2020-10-15 (×2): qty 1

## 2020-10-15 MED ORDER — AMLODIPINE BESYLATE 5 MG PO TABS
10.0000 mg | ORAL_TABLET | Freq: Every day | ORAL | Status: DC
  Administered 2020-10-15: 10 mg via ORAL
  Filled 2020-10-15: qty 2

## 2020-10-15 MED ORDER — LORAZEPAM 2 MG/ML IJ SOLN: 0.0000 mg | Freq: Two times a day (BID) | INTRAMUSCULAR | Status: DC

## 2020-10-15 NOTE — BH Assessment (Signed)
TTS Counselor wants to know if [name] is able to engaged in a assessment, if so, please put them in private room. Clinician to call the cart. Thanks

## 2020-10-15 NOTE — BH Assessment (Signed)
Comprehensive Clinical Assessment (CCA) Note  10/15/2020 Quaran Kedzierski [id number]  Chief Complaint:  Chief Complaint  Patient presents with  . Emesis   Visit Diagnosis:  F33.2 Major depressive disorder, Recurrent episode, Severe F10.20 Alcohol use disorder, Severe  Flowsheet Row ED from 10/15/2020 in [place] ED from 10/10/2020 in [place] [facility] ED from 08/20/2020 in [place] [facility]  C-SSRS RISK CATEGORY Error: Q7 should not be populated when Q6 is No No Risk No Risk     The patient demonstrates the following risk factors for suicide: Chronic risk factors for suicide include: psychiatric disorder of MDD, previous attempts 5 years ago by overdosing and history alcohol abuse.  Acute risk factors for suicide include: unemployment, social withdrawal/isolation and loss (financial, interpersonal, professional). Protective factors for this patient include: positive social support, positive therapeutic relationship, coping skills and hope for the future. Considering these factors, the overall suicide risk at this point appears Low. Patient is appropriate for outpatient follow up.  Disposition: Leandro Reasoner NP, recommends pt to be psychiatric cleared, discharged follow up with [facility], 12 Step AA and medication management. Disposition discuss with Andria Frames RN to discuss disposition with EDP.   Cadden Elizondo is a 40 year old single male who presents voluntarily to [facility] via [facility]  unaccompanied.  Pt provide collateral information: [name], mother, [phone], unable to contact. Pt reports he has history of bipolar disorder and has been feeling increasingly depressed for the last two weeks. Pt states "I have lost my my job, car, home and pet within two months.  Pt acknowledges symptoms including daily crying spells, social withdrawal, loss of interest in usual pleasures,  decreased concentration, overwhelmed, worrying, frustrated and feelings of worthlessness.  Pt reports that he is sleeping eight hours during the night; also his appetite is fine. Pt denies SI, one previous suicide attempt five years by overdose on pills. Pt denies any recent manic symptoms,  Pt denies any history of intentional self-injurious behavior. Pt denies current homicidal ideation or history of violence. Pt denies any history of auditory or visual hallucinations. Pt denies paranoia.   Pt says he has been drinking 2 or 3 bottles of chardonnay wine daily; also admitted to using crack cocaine two weeks ago.  Pt identifies his primary stressors as employment and financial problems. Pt reports losing his job, car, housing and his pet. Pt lives with a friend temporary.  Pt reports his father has a history of substance use, also denies mental illness in the family.  Pt denies history of abuse or trauma.  Pt denies any current legal problems.    Pt says he is currently receiving weekly outpatient therapy with a therapist at [facility] and receiving outpatient medication management. Pt reports he missed his appointment last week, have not taken his mental  medication as prescribed.  Pt reports one previous inpatient psychiatric hospitalization at [facility] suicide attempt overdose pills. Pt reports he has a diagnosis of hypertention.  Pt is dressed  casual, alert, oriented x 4 with normal speech and restless motor behavior. Eye contact is good. Pt's mood is dysphoric, and affect is appropriate. Thought process is coherent and relevant. Pt's insight is present, and judgment is impaired. There is no indication Pt is currently responding to internal stimuli or experiencing delusional thought content. Pt was cooperative throughout assessment.   CCA Screening, Triage and Referral (STR)  Patient Reported Information How did you hear about [place]? Self (Pt was brought in  by Charisse Klinefelter)  Referral name: [facility]  Referral phone number:  No data recorded  Whom do you see for routine medical problems? Hospital ER  Practice/Facility Name: No data recorded Practice/Facility Phone Number: No data recorded Name of Contact: No data recorded Contact Number: No data recorded Contact Fax Number: No data recorded Prescriber Name: Cassie Freer  Prescriber Address (if known): No data recorded  What Is the Reason for Your Visit/Call Today? Depression  How Long Has This Been Causing You Problems? 1-6 months  What Do You Feel Would Help You the Most Today? Alcohol or Drug Use Treatment; Treatment for Depression or other mood problem   Have You Recently Been in Any Inpatient Treatment (Hospital/Detox/Crisis Center/28-Day Program)? No  Name/Location of Program/Hospital:[facility] three years ago  How Long Were You There? No data recorded When Were You Discharged? No data recorded  Have You Ever Received Services From [facility] Before? Yes  Who Do You See at [facility]? 02.21.22   Have You Recently Had Any Thoughts About Hurting Yourself? No  Are You Planning to Commit Suicide/Harm Yourself At This time? No   Have you Recently Had Thoughts About [place]? No  Explanation: No data recorded  Have You Used Any Alcohol or Drugs in the Past 24 Hours? Yes  How Long Ago Did You Use Drugs or Alcohol? No data recorded What Did You Use and How Much? 10/15/20   Do You Currently Have a Therapist/Psychiatrist? Yes  Name of Therapist/Psychiatrist: [facility], unable to identify therapist name   Have You Been Recently Discharged From Any Office Practice or Programs? No  Explanation of Discharge From Practice/Program: No data recorded    CCA Screening Triage Referral Assessment Type of Contact: Tele-Assessment  Is this Initial or Reassessment? Initial Assessment  Date Telepsych consult ordered in [facility]:  10/15/2020  Time Telepsych consult ordered in [facility]:  No data recorded  Patient Reported  Information Reviewed? Yes  Patient Left Without Being Seen? No data recorded Reason for Not Completing Assessment: No data recorded  Collateral Involvement: [name], mother, [phone]; unable to contact parent.   Does Patient Have a Stage manager Guardian? No data recorded Name and Contact of Legal Guardian: No data recorded If Minor and Not Living with Parent(s), Who has Custody? n/a  Is CPS involved or ever been involved? Never  Is APS involved or ever been involved? Never   Patient Determined To Be At Risk for Harm To Self or Others Based on Review of Patient Reported Information or Presenting Complaint? No  Method: No data recorded Availability of Means: No data recorded Intent: No data recorded Notification Required: No data recorded Additional Information for Danger to Others Potential: No data recorded Additional Comments for Danger to Others Potential: No data recorded Are There Guns or Other Weapons in Your Home? No data recorded Types of Guns/Weapons: No data recorded Are These Weapons Safely Secured?                            No data recorded Who Could Verify You Are Able To Have These Secured: No data recorded Do You Have any Outstanding Charges, Pending Court Dates, Parole/Probation? No data recorded Contacted To Inform of Risk of Harm To Self or Others: -- (Pt reports no risk to himself or others at this time.)   Location of Assessment: [facility]   Does Patient Present under Involuntary Commitment? No  IVC  Papers Initial File Date: No data recorded  [place] of Residence: [place]   Patient Currently Receiving the Following Services: Individual Therapy (Pt reports that he met with his therapist last week at [facility])   Determination of Need: Routine (7 days)   Options For Referral: Chemical Dependency Intensive Outpatient Therapy (CDIOP)     CCA Biopsychosocial Intake/Chief Complaint:  Depression  Current  Symptoms/Problems: alcohol,worrying, sadness, hopelessness, crying, poor self esteen   Patient Reported Schizophrenia/Schizoaffective Diagnosis in Past: No   Strengths: UTA  Preferences: UTA  Abilities: UTA   Type of Services Patient Feels are Needed: UTA   Initial Clinical Notes/Concerns: hopelessness, crying   Mental Health Symptoms Depression:  Change in energy/activity; Difficulty Concentrating; Fatigue; Hopelessness; Irritability; Tearfulness   Duration of Depressive symptoms: Greater than two weeks   Mania:  None   Anxiety:   Difficulty concentrating; Fatigue; Irritability; Restlessness; Worrying; Tension   Psychosis:  None   Duration of Psychotic symptoms: No data recorded  Trauma:  -- (Pt reports he lost his job,car, house and pet within two months.)   Obsessions:  Recurrent & persistent thoughts/impulses/images   Compulsions:  Repeated behaviors/mental acts   Inattention:  None   Hyperactivity/Impulsivity:  N/A   Oppositional/Defiant Behaviors:  None   Emotional Irregularity:  Chronic feelings of emptiness   Other Mood/Personality Symptoms:  No data recorded   Mental Status Exam Appearance and self-care  Stature:  Average   Weight:  Average weight   Clothing:  -- (Pt dressed in scrubs)   Grooming:  Normal   Cosmetic use:  Age appropriate   Posture/gait:  Normal   Motor activity:  Restless   Sensorium  Attention:  Inattentive   Concentration:  Anxiety interferes   Orientation:  Object; Person; Place; Situation   Recall/memory:  Normal   Affect and Mood  Affect:  Appropriate   Mood:  Dysphoric   Relating  Eye contact:  None   Facial expression:  Responsive   Attitude toward examiner:  Cooperative   Thought and Language  Speech flow: Clear and Coherent   Thought content:  Appropriate to Mood and Circumstances   Preoccupation:  None   Hallucinations:  None   Organization:  No data recorded  [employer]  of Knowledge:  Good   Intelligence:  Average   Abstraction:  Normal   Judgement:  Impaired   Reality Testing:  Adequate   Insight:  Present   Decision Making:  Normal   Social Functioning  Social Maturity:  Isolates   Social Judgement:  Victimized   Stress  Stressors:  Housing; Museum/gallery curator; Other (Comment) (Pt reports that he needs a job)   Coping Ability:  Deficient supports   Skill Deficits:  Self-care; Self-control; Decision making   Supports:  Support needed     Religion: Religion/Spirituality Are You A Religious Person?:  (UTA) How Might This Affect Treatment?: UTA  Leisure/Recreation: Leisure / Recreation Do You Have Hobbies?: Yes Leisure and Hobbies: Pt reports he like to shop  Exercise/Diet: Exercise/Diet Do You Exercise?: No Have You Gained or Lost A Significant Amount of Weight in the Past Six Months?: No Do You Follow a Special Diet?: No Do You Have Any Trouble Sleeping?: No   CCA Employment/Education Employment/Work Situation: Employment / Work Situation Employment situation: Unemployed [name] job has been impacted by current illness: No What is the longest time patient has a held a job?: UTA Where was the patient employed at that time?: UTA Has patient ever been in  the [employer]?: No  Education: Education Is Patient Currently Attending School?: No Last Grade Completed: 12 Name of High School: [employer] Did You Graduate From [employer]?: Yes Did You Attend College?:  (UTA) Did You Attend Graduate School?:  (UTA) Did You Have Any Special Interests In School?: n/a Did You Have An Individualized Education Program (IIEP):  (UTA) Did You Have Any Difficulty At School?:  (UTA) [name] Education Has Been Impacted by Current Illness:  (UTA)   CCA Family/Childhood History Family and Relationship History: Family history Are you sexually active?:  (UTA) What is your sexual orientation?: UTA Has your sexual activity been affected  by drugs, alcohol, medication, or emotional stress?: UTA Does patient have children?: No  Childhood History:  Childhood History By whom was/is the patient raised?: Mother Additional childhood history information: UTA Description of patient's relationship with caregiver when they were a child: supportive [name] description of current relationship with people who raised him/her: supportive How were you disciplined when you got in trouble as a child/adolescent?: UTA Does patient have siblings?:  (UTA) Did patient suffer any verbal/emotional/physical/sexual abuse as a child?: No Did patient suffer from severe childhood neglect?: No Has patient ever been sexually abused/assaulted/raped as an adolescent or adult?: No Was the patient ever a victim of a crime or a disaster?: No Witnessed domestic violence?: No Has patient been affected by domestic violence as an adult?: No  Child/Adolescent Assessment:     CCA Substance Use Alcohol/Drug Use: Alcohol / Drug Use Pain Medications: See MRA Prescriptions: See MRA Over the Counter: See MRA History of alcohol / drug use?: Yes Substance #1 Name of Substance 1: [place] 1 - Age of First Use: 13 1 - Amount (size/oz): 2 or 3 bottles of Chardonnay  Wine 1 - Frequency: daily 1 - Duration: ongoing 1 - Last Use / Amount: 10/15/20 (morning) 1 - Method of Aquiring: Purchase 1- Route of Use: drinking Substance #2 Name of Substance 2: Crack Cocaine 2 - Age of First Use: UTA 2 - Amount (size/oz): UTa 2 - Frequency: UTA 2 - Duration: UTA 2 - Last Use / Amount: 2 weeks ago 2 - Method of Aquiring: UTA 2 - Route of Substance Use: smoking                     ASAM's:  Six Dimensions of Multidimensional Assessment  Dimension 1:  Acute Intoxication and/or Withdrawal Potential:   Dimension 1:  Description of individual's past and current experiences of substance use and withdrawal: Pt reports that he drinks wine daily  Dimension 2:   Biomedical Conditions and Complications:   Dimension 2:  Description of patient's biomedical conditions and  complications: hypertension  Dimension 3:  Emotional, Behavioral, or Cognitive Conditions and Complications:  Dimension 3:  Description of emotional, behavioral, or cognitive conditions and complications: Bipolar, depression  Dimension 4:  Readiness to Change:  Dimension 4:  Description of Readiness to Change criteria: Pcontemplaton stage  Dimension 5:  Relapse, Continued use, or Continued Problem Potential:  Dimension 5:  Relapse, continued use, or continued problem potential critiera description: People recognize low self esteem is a trigger  Dimension 6:  Recovery/Living Environment:  Dimension 6:  Recovery/Iiving environment criteria description: lost his home, job, car, and pet within two mos.  ASAM Severity Score: ASAM's Severity Rating Score: 15  ASAM Recommended Level of Treatment: ASAM Recommended Level of Treatment: Level II Partial Hospitalization Treatment   Substance use Disorder (SUD) Substance Use Disorder (SUD)  Checklist Symptoms of Substance Use: Continued use despite having a persistent/recurrent physical/psychological problem caused/exacerbated by use,Continued use despite persistent or recurrent social, interpersonal problems, caused or exacerbated by use,Evidence of withdrawal (Comment),Large amounts of time spent to obtain, use or recover from the substance(s),Persistent desire or unsuccessful efforts to cut down or control use,Presence of craving or strong urge to use,Recurrent use that results in a failure to fulfill major role obligations (work, school, home),Repeated use in physically hazardous situations,Substance(s) often taken in larger amounts or over longer times than was intended  Recommendations for Services/Supports/Treatments: Recommendations for Services/Supports/Treatments Recommendations For Services/Supports/Treatments: ACCTT (Assertive Community  Treatment),Residential-Level 2  DSM5 Diagnoses: Patient Active Problem List   Diagnosis Date Noted  . Pain of left upper arm 09/15/2020  . Dysesthesia 09/15/2020  . Abnormality of gait 08/14/2020  . Sleep disturbance 07/03/2020  . Thoracic outlet syndrome 06/05/2020  . Bipolar affective disorder, mixed, severe, with psychotic behavior ([place]) 05/22/2020  . GAD (generalized anxiety disorder) 05/22/2020  . Chronic pain 03/26/2020  . Polysubstance dependence including opioid type drug, continuous use ([place]) 08/02/2018  . Severe recurrent major depression without psychotic features ([place]) 07/29/2018  . Chronic left shoulder pain 01/11/2018  . Pain of left clavicle 01/11/2018  . Abdominal pain 12/31/2017  . Alcohol withdrawal seizure without complication ([place])   . Displaced fracture of lateral end of left clavicle, initial encounter for closed fracture 08/05/2017  . Coracoclavicular (ligament) sprain and strain, left, initial encounter 08/05/2017  . Closed dislocation of acromioclavicular joint, initial encounter 08/05/2017  . Insomnia   . Hypertension   . Severe episode of recurrent major depressive disorder, without psychotic features ([place])   . Depression, major, severe recurrence ([place]) 12/30/2015  . Substance induced mood disorder ([place]) 12/02/2015  . Mood disorder in conditions classified elsewhere   . Malnutrition of moderate degree 09/24/2015  . Tobacco use disorder 07/16/2015  . Elevated transaminase level   . Drug overdose, intentional ([place]) 07/12/2015  . Alcohol dependence with alcohol-induced mood disorder ([place]) 04/15/2015  . Cocaine abuse with cocaine-induced mood disorder ([place]) 04/11/2015  . Overdose 04/10/2015  . Severe recurrent major depressive disorder with psychotic features ([place])   . Alcohol-induced mood disorder ([place]) 09/10/2014  . Suicidal ideation   . Tylenol overdose   . Polysubstance abuse ([place])   . Overdose of acetaminophen 08/03/2014  . Overdose by  acetaminophen 08/03/2014  . Cocaine abuse ([place]) 04/16/2014  . Thrombocytopenia ([place]) 04/15/2014  . Urinary tract infection, site not specified 04/15/2014  . Transaminitis 09/24/2013  . Cocaine dependence ([place]) 09/20/2013  . S/p nephrectomy 04/28/2013  . Seizure ([place]) 03/15/2013  . Leukocytopenia, unspecified 01/02/2013  . Left kidney mass 12/24/2012  . PTSD (post-traumatic stress disorder) 07/06/2012  . Episode of recurrent major depressive disorder ([place]) 06/29/2012    Class: Acute  . Peripheral vascular disease ([place]) 01/14/2012  . SEIZURE DISORDER 10/03/2008  . Elevated LFTs 12/29/2007  . HYPERCHOLESTEROLEMIA 03/21/2007  . Essential hypertension 03/21/2007      Referrals to Alternative Service(s): Referred to Alternative Service(s):   Place:   Date:   Time:    Referred to Alternative Service(s):   Place:   Date:   Time:    Referred to Alternative Service(s):   Place:   Date:   Time:    Referred to Alternative Service(s):   Place:   Date:   Time:     Leonides Schanz, Counselor

## 2020-10-15 NOTE — Discharge Instructions (Signed)
Please read and follow all provided instructions.  Your diagnoses today include:  1. Alcoholic hepatitis without ascites   2. Hematochezia   3. Alcohol abuse   4. Suicidal ideation     Tests performed today include: Blood cell counts (white, red, and platelets) Electrolytes  Kidney function test Liver function test - shows very high liver tests probably due to alcohol use Pancreas test (lipase) Alcohol level - was high  Vital signs. See below for your results today.   Medications prescribed:   Zofran (ondansetron) - for nausea and vomiting  Take any prescribed medications only as directed.  Home care instructions:  Follow any educational materials contained in this packet.  Follow-up instructions: Please follow-up with your primary care provider in the next 3 days for further evaluation of your symptoms. You need to have your liver tests rechecked! This is very important.   Return instructions:   Please return to the Emergency Department if you experience worsening symptoms.   Return with persistent vomiting, worsening abdominal pain.   Please return if you have any other emergent concerns.  Additional Information:  Your vital signs today were: BP 129/84 (BP Location: Left Arm)   Pulse 83   Temp 98.4 F (36.9 C) (Oral)   Resp 16   Ht 5\' 6"  (1.676 m)   Wt 71.6 kg   SpO2 98%   BMI 25.48 kg/m  If your blood pressure (BP) was elevated above 135/85 this visit, please have this repeated by your doctor within one month. --------------

## 2020-10-15 NOTE — ED Provider Notes (Signed)
3:14 PM Signout from [employer] at shift change.   Pt presents with acute complaint of vomiting in setting of heavy alcohol use. Blood reported in vomit and stool. Heme neg here and hgb elevated. Currently receiving IV fluids and symptom control.   He is also reporting desire for detox and SI. May need psych input when symptoms are better controlled. Currently pending CMP and lipase -- initial lab draw was clotted.   6:02 PM I went and evaluated the patient.  He looks comfortable.  We discussed lab results including signs of alcoholic hepatitis.  Symptoms are much better controlled now after IV fluids and antiemetics.  He is drinking a cup of water without vomiting.  He is currently taking his medications.   I talked to him about his suicidal ideation from earlier.  He states that that has now passed and he no longer feels suicidal.  I asked him if he wanted to talk to somebody about his feelings from earlier or about his alcohol use, and he declined stating that he just needs resources.  Discussed that if he continued to have symptoms of nausea, vomiting, and hematemesis, I would be concerned about him going home.  He states that he feels comfortable going home.  He agrees to see how he feels after taking his medications.   BP 136/78   Pulse 73   Temp 98.7 F (37.1 C) (Oral)   Resp 11   Ht 5\' 6"  (1.676 m)   Wt 71.6 kg   SpO2 100%   BMI 25.48 kg/m   Exam:  Gen NAD; Heart RRR, nml S1,S2, no m/r/g; Lungs CTAB; Abd soft, mild mid-abdominal tenderness, no rebound or guarding; Ext 2+ pedal pulses bilaterally, no edema.   9:38 PM patient was monitored for additional time.  TTS consult was obtained.  TTS recommendation was for outpatient treatment and resources.  At this time patient is ready to go home.  He has tolerated his medications and food without vomiting.  Strongly encouraged follow-up with PCP.  Encouraged avoidance of alcohol.  The patient was urged to return to the Emergency  Department immediately with worsening of current symptoms, worsening abdominal pain, persistent vomiting, blood noted in stools, fever, or any other concerns. The patient verbalized understanding.   MDM: Patient presents with nausea and vomiting, concern for hematochezia and hematemesis.  Vomiting is well controlled.  Lipase is normal.  Patient with elevated alcohol testing as well as liver enzymes indicating alcoholic hepatitis.  AST/ALT ratio elevated suggesting alcohol as a cause.  As symptoms are controlled, I feel that the patient is reasonable for trial of therapy at home.  He does not want to stay in hospital.  Patient also reported suicidal ideations on arrival to the hospital.  These were passive.  Patient no longer reports feeling suicidal.  He was evaluated by TTS and cleared.  Plan for outpatient resources.   BP 129/84 (BP Location: Left Arm)   Pulse 83   Temp 98.4 F (36.9 C) (Oral)   Resp 16   Ht 5\' 6"  (1.676 m)   Wt 71.6 kg   SpO2 98%   BMI 25.48 kg/m    Results for orders placed or performed during the hospital encounter of 10/15/20  Resp Panel by RT-PCR (Flu A&B, Covid) Nasopharyngeal Swab   Specimen: Nasopharyngeal Swab; Nasopharyngeal(NP) swabs in vial transport medium  Result Value Ref Range   SARS Coronavirus 2 by RT PCR NEGATIVE NEGATIVE   Influenza A by PCR NEGATIVE NEGATIVE  Influenza B by PCR NEGATIVE NEGATIVE  CBC with Differential  Result Value Ref Range   WBC 4.5 4.0 - 10.5 K/uL   RBC 5.90 (H) 4.22 - 5.81 MIL/uL   Hemoglobin 18.1 (H) 13.0 - 17.0 g/dL   HCT 50.0 39.0 - 52.0 %   MCV 84.7 80.0 - 100.0 fL   MCH 30.7 26.0 - 34.0 pg   MCHC 36.2 (H) 30.0 - 36.0 g/dL   RDW 13.3 11.5 - 15.5 %   Platelets 109 (L) 150 - 400 K/uL   nRBC 0.0 0.0 - 0.2 %   Neutrophils Relative % 40 %   Neutro Abs 1.8 1.7 - 7.7 K/uL   Lymphocytes Relative 46 %   Lymphs Abs 2.0 0.7 - 4.0 K/uL   Monocytes Relative 13 %   Monocytes Absolute 0.6 0.1 - 1.0 K/uL   Eosinophils  Relative 0 %   Eosinophils Absolute 0.0 0.0 - 0.5 K/uL   Basophils Relative 1 %   Basophils Absolute 0.0 0.0 - 0.1 K/uL   Immature Granulocytes 0 %   Abs Immature Granulocytes 0.02 0.00 - 0.07 K/uL  Ethanol  Result Value Ref Range   Alcohol, Ethyl (B) 220 (H) <10 mg/dL  Comprehensive metabolic panel  Result Value Ref Range   Sodium 136 135 - 145 mmol/L   Potassium 3.9 3.5 - 5.1 mmol/L   Chloride 91 (L) 98 - 111 mmol/L   CO2 28 22 - 32 mmol/L   Glucose, Bld 99 70 - 99 mg/dL   BUN 8 6 - 20 mg/dL   Creatinine, Ser 1.09 0.61 - 1.24 mg/dL   Calcium 9.2 8.9 - 10.3 mg/dL   Total Protein 7.7 6.5 - 8.1 g/dL   Albumin 4.3 3.5 - 5.0 g/dL   AST 1,139 (H) 15 - 41 U/L   ALT 717 (H) 0 - 44 U/L   Alkaline Phosphatase 62 38 - 126 U/L   Total Bilirubin 1.1 0.3 - 1.2 mg/dL   GFR, Estimated >60 >60 mL/min   Anion gap 17 (H) 5 - 15  Lipase, blood  Result Value Ref Range   Lipase 30 11 - 51 U/L  POC occult blood, ED RN will collect  Result Value Ref Range   Fecal Occult Bld NEGATIVE NEGATIVE   No results found.     Carlisle Cater, PA-C 10/15/20 2144    Gareth Morgan, MD 10/16/20 1218
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
## 2020-10-15 NOTE — ED Notes (Signed)
Sitter at bedside.

## 2020-10-15 NOTE — ED Notes (Signed)
Pt belongings returned and checked with this RN and pt. Pt contacted family for transportation home.

## 2020-10-15 NOTE — ED Triage Notes (Signed)
[facility] from home. Called out for vomiting dark blood. On scene pt c/o blood in stool X1 week, HA X4 days, chest pain and vomiting starting at 0500 this am, blood  In vomit was noted later in the day. Pt states he drank "box of wine finishing at 2am"  BH hx, off all meds for a week. Also SI

## 2020-10-20 ENCOUNTER — Encounter: Payer: Self-pay | Admitting: Physical Medicine & Rehabilitation

## 2020-10-27 ENCOUNTER — Ambulatory Visit (HOSPITAL_COMMUNITY): Payer: No Payment, Other | Admitting: Psychiatry

## 2020-10-27 ENCOUNTER — Encounter (HOSPITAL_COMMUNITY): Payer: Self-pay | Admitting: Psychiatry

## 2020-10-27 ENCOUNTER — Other Ambulatory Visit: Payer: Self-pay

## 2020-10-27 VITALS — BP 140/96 | HR 108 | Ht 66.0 in | Wt 164.0 lb

## 2020-10-27 DIAGNOSIS — F315 Bipolar disorder, current episode depressed, severe, with psychotic features: Secondary | ICD-10-CM | POA: Insufficient documentation

## 2020-10-27 DIAGNOSIS — F3175 Bipolar disorder, in partial remission, most recent episode depressed: Secondary | ICD-10-CM

## 2020-10-27 DIAGNOSIS — F411 Generalized anxiety disorder: Secondary | ICD-10-CM

## 2020-10-27 MED ORDER — QUETIAPINE FUMARATE 300 MG PO TABS: 300.0000 mg | ORAL_TABLET | Freq: Every day | ORAL | 2 refills | Status: DC

## 2020-10-27 MED ORDER — BUSPIRONE HCL 10 MG PO TABS: 10.0000 mg | ORAL_TABLET | Freq: Three times a day (TID) | ORAL | 2 refills | Status: DC

## 2020-10-27 MED ORDER — HYDROXYZINE HCL 50 MG PO TABS: 50.0000 mg | ORAL_TABLET | Freq: Three times a day (TID) | ORAL | 2 refills | Status: DC | PRN

## 2020-10-27 MED ORDER — DULOXETINE HCL 30 MG PO CPEP: 30.0000 mg | ORAL_CAPSULE | Freq: Every day | ORAL | 2 refills | Status: DC

## 2020-10-27 MED ORDER — LAMOTRIGINE 25 MG PO TABS
75.0000 mg | ORAL_TABLET | Freq: Every day | ORAL | 1 refills | Status: DC
Start: 2020-10-27 — End: 2021-01-07
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
## 2020-10-31 ENCOUNTER — Emergency Department (HOSPITAL_COMMUNITY)
Admission: EM | Admit: 2020-10-31 | Discharge: 2020-10-31 | Disposition: A | Discharge status: Home / Self Care | Payer: Worker's Compensation | Attending: Emergency Medicine | Admitting: Emergency Medicine

## 2020-10-31 ENCOUNTER — Encounter (HOSPITAL_COMMUNITY): Payer: Self-pay | Admitting: Pharmacy Technician

## 2020-10-31 ENCOUNTER — Emergency Department (HOSPITAL_COMMUNITY): Payer: Worker's Compensation

## 2020-10-31 DIAGNOSIS — Z85528 Personal history of other malignant neoplasm of kidney: Secondary | ICD-10-CM | POA: Insufficient documentation

## 2020-10-31 DIAGNOSIS — W19XXXA Unspecified fall, initial encounter: Secondary | ICD-10-CM | POA: Insufficient documentation

## 2020-10-31 DIAGNOSIS — S0990XA Unspecified injury of head, initial encounter: Secondary | ICD-10-CM | POA: Diagnosis not present

## 2020-10-31 DIAGNOSIS — Z79899 Other long term (current) drug therapy: Secondary | ICD-10-CM | POA: Diagnosis not present

## 2020-10-31 DIAGNOSIS — G40909 Epilepsy, unspecified, not intractable, without status epilepticus: Secondary | ICD-10-CM | POA: Insufficient documentation

## 2020-10-31 DIAGNOSIS — R569 Unspecified convulsions: Secondary | ICD-10-CM

## 2020-10-31 DIAGNOSIS — Z8616 Personal history of COVID-19: Secondary | ICD-10-CM | POA: Insufficient documentation

## 2020-10-31 DIAGNOSIS — Y9389 Activity, other specified: Secondary | ICD-10-CM | POA: Insufficient documentation

## 2020-10-31 DIAGNOSIS — Z853 Personal history of malignant neoplasm of breast: Secondary | ICD-10-CM | POA: Insufficient documentation

## 2020-10-31 DIAGNOSIS — I2510 Atherosclerotic heart disease of native coronary artery without angina pectoris: Secondary | ICD-10-CM | POA: Diagnosis not present

## 2020-10-31 DIAGNOSIS — F17210 Nicotine dependence, cigarettes, uncomplicated: Secondary | ICD-10-CM | POA: Insufficient documentation

## 2020-10-31 DIAGNOSIS — I10 Essential (primary) hypertension: Secondary | ICD-10-CM | POA: Insufficient documentation

## 2020-10-31 LAB — CBC WITH DIFFERENTIAL/PLATELET
Abs Immature Granulocytes: 0.01 10*3/uL (ref 0.00–0.07)
Basophils Absolute: 0 10*3/uL (ref 0.0–0.1)
Basophils Relative: 1 %
Eosinophils Absolute: 0.1 10*3/uL (ref 0.0–0.5)
Eosinophils Relative: 2 %
HCT: 40 % (ref 39.0–52.0)
Hemoglobin: 13.6 g/dL (ref 13.0–17.0)
Immature Granulocytes: 0 %
Lymphocytes Relative: 27 %
Lymphs Abs: 1 10*3/uL (ref 0.7–4.0)
MCH: 30.5 pg (ref 26.0–34.0)
MCHC: 34 g/dL (ref 30.0–36.0)
MCV: 89.7 fL (ref 80.0–100.0)
Monocytes Absolute: 0.3 10*3/uL (ref 0.1–1.0)
Monocytes Relative: 9 %
Neutro Abs: 2.3 10*3/uL (ref 1.7–7.7)
Neutrophils Relative %: 61 %
Platelets: 273 10*3/uL (ref 150–400)
RBC: 4.46 MIL/uL (ref 4.22–5.81)
RDW: 13.9 % (ref 11.5–15.5)
WBC: 3.8 10*3/uL — ABNORMAL LOW (ref 4.0–10.5)
nRBC: 0 % (ref 0.0–0.2)

## 2020-10-31 LAB — COMPREHENSIVE METABOLIC PANEL
ALT: 38 U/L (ref 0–44)
AST: 35 U/L (ref 15–41)
Albumin: 3.7 g/dL (ref 3.5–5.0)
Alkaline Phosphatase: 42 U/L (ref 38–126)
Anion gap: 4 — ABNORMAL LOW (ref 5–15)
BUN: 14 mg/dL (ref 6–20)
CO2: 26 mmol/L (ref 22–32)
Calcium: 9.1 mg/dL (ref 8.9–10.3)
Chloride: 104 mmol/L (ref 98–111)
Creatinine, Ser: 0.86 mg/dL (ref 0.61–1.24)
GFR, Estimated: >60 mL/min (ref >60)
Glucose, Bld: 95 mg/dL (ref 70–99)
Potassium: 4.1 mmol/L (ref 3.5–5.1)
Sodium: 134 mmol/L — ABNORMAL LOW (ref 135–145)
Total Bilirubin: 0.7 mg/dL (ref 0.3–1.2)
Total Protein: 6.5 g/dL (ref 6.5–8.1)

## 2020-10-31 NOTE — ED Triage Notes (Signed)
Pt here with reports of falling backwards and hitting his head. Denies LOC. Pt reports having 4 seizures today after event. Reports hx of seizures, denies missing any doses of medications.
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
## 2020-11-01 ENCOUNTER — Other Ambulatory Visit: Payer: Self-pay

## 2020-11-01 ENCOUNTER — Emergency Department (HOSPITAL_COMMUNITY)
Admission: EM | Admit: 2020-11-01 | Discharge: 2020-11-01 | Disposition: A | Discharge status: Home / Self Care | Payer: Self-pay | Attending: Emergency Medicine | Admitting: Emergency Medicine

## 2020-11-01 DIAGNOSIS — Z79899 Other long term (current) drug therapy: Secondary | ICD-10-CM | POA: Insufficient documentation

## 2020-11-01 DIAGNOSIS — Z85528 Personal history of other malignant neoplasm of kidney: Secondary | ICD-10-CM | POA: Insufficient documentation

## 2020-11-01 DIAGNOSIS — I2510 Atherosclerotic heart disease of native coronary artery without angina pectoris: Secondary | ICD-10-CM | POA: Insufficient documentation

## 2020-11-01 DIAGNOSIS — F17210 Nicotine dependence, cigarettes, uncomplicated: Secondary | ICD-10-CM | POA: Insufficient documentation

## 2020-11-01 DIAGNOSIS — Y900 Blood alcohol level of less than 20 mg/100 ml: Secondary | ICD-10-CM | POA: Insufficient documentation

## 2020-11-01 DIAGNOSIS — Z8616 Personal history of COVID-19: Secondary | ICD-10-CM | POA: Insufficient documentation

## 2020-11-01 DIAGNOSIS — I10 Essential (primary) hypertension: Secondary | ICD-10-CM | POA: Insufficient documentation

## 2020-11-01 DIAGNOSIS — F1410 Cocaine abuse, uncomplicated: Secondary | ICD-10-CM | POA: Insufficient documentation

## 2020-11-01 DIAGNOSIS — Z853 Personal history of malignant neoplasm of breast: Secondary | ICD-10-CM | POA: Insufficient documentation

## 2020-11-01 LAB — COMPREHENSIVE METABOLIC PANEL
ALT: 40 U/L (ref 0–44)
AST: 40 U/L (ref 15–41)
Albumin: 4 g/dL (ref 3.5–5.0)
Alkaline Phosphatase: 43 U/L (ref 38–126)
Anion gap: 8 (ref 5–15)
BUN: 15 mg/dL (ref 6–20)
CO2: 22 mmol/L (ref 22–32)
Calcium: 9.1 mg/dL (ref 8.9–10.3)
Chloride: 103 mmol/L (ref 98–111)
Creatinine, Ser: 0.89 mg/dL (ref 0.61–1.24)
GFR, Estimated: >60 mL/min (ref >60)
Glucose, Bld: 81 mg/dL (ref 70–99)
Potassium: 3.9 mmol/L (ref 3.5–5.1)
Sodium: 133 mmol/L — ABNORMAL LOW (ref 135–145)
Total Bilirubin: 0.5 mg/dL (ref 0.3–1.2)
Total Protein: 7.2 g/dL (ref 6.5–8.1)

## 2020-11-01 LAB — CBC WITH DIFFERENTIAL/PLATELET
Abs Immature Granulocytes: 0.02 10*3/uL (ref 0.00–0.07)
Basophils Absolute: 0.1 10*3/uL (ref 0.0–0.1)
Basophils Relative: 1 %
Eosinophils Absolute: 0.2 10*3/uL (ref 0.0–0.5)
Eosinophils Relative: 4 %
HCT: 40.8 % (ref 39.0–52.0)
Hemoglobin: 14 g/dL (ref 13.0–17.0)
Immature Granulocytes: 0 %
Lymphocytes Relative: 37 %
Lymphs Abs: 2 10*3/uL (ref 0.7–4.0)
MCH: 30.1 pg (ref 26.0–34.0)
MCHC: 34.3 g/dL (ref 30.0–36.0)
MCV: 87.7 fL (ref 80.0–100.0)
Monocytes Absolute: 0.3 10*3/uL (ref 0.1–1.0)
Monocytes Relative: 6 %
Neutro Abs: 2.8 10*3/uL (ref 1.7–7.7)
Neutrophils Relative %: 52 %
Platelets: 271 10*3/uL (ref 150–400)
RBC: 4.65 MIL/uL (ref 4.22–5.81)
RDW: 13.8 % (ref 11.5–15.5)
WBC: 5.5 10*3/uL (ref 4.0–10.5)
nRBC: 0 % (ref 0.0–0.2)

## 2020-11-01 LAB — RAPID URINE DRUG SCREEN, HOSP PERFORMED
Amphetamines: NOT DETECTED
Barbiturates: NOT DETECTED
Benzodiazepines: NOT DETECTED
Cocaine: POSITIVE — AB
Opiates: NOT DETECTED
Tetrahydrocannabinol: NOT DETECTED

## 2020-11-01 LAB — ETHANOL: Alcohol, Ethyl (B): <10 mg/dL (ref <10)

## 2020-11-01 MED ORDER — ACETAMINOPHEN 325 MG PO TABS
650.0000 mg | ORAL_TABLET | Freq: Once | ORAL | Status: AC
  Administered 2020-11-01: 650 mg via ORAL
  Filled 2020-11-01: qty 2

## 2020-11-01 NOTE — ED Notes (Signed)
ED Provider at bedside. 

## 2020-11-01 NOTE — Discharge Instructions (Addendum)
Please don't drink alcohol or using illegal drugs such as cocaine.   Return for any problem.

## 2020-11-01 NOTE — ED Triage Notes (Signed)
Brought via EMS c/o generalized weakness & "shaking" starting after "5-6 seizure episodes" yesterday. Dx w/ concussion at [facility]. C/o fall x2 today; denies hitting head/denies LOC.
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
## 2020-11-03 ENCOUNTER — Telehealth: Payer: Self-pay

## 2020-11-03 NOTE — Telephone Encounter (Signed)
Return pt's call. Stated he was at work on [place] and hit his head. Stated he had 5 seizures "back to back". He went to the ED on Friday 4/15 and again on 4/16 (see ED's notes). Stated he was told he has a concussion and needs to see a neurologist. Stated no more seizures but having "shaking" of his legs. Informed he will need an appt here and be evaluated before a referral can be ordered. appt scheduled tomorrow 4/19 @ 1415PM. Pt advised any changes in his condition, to go to the ED - pt stated understanding.

## 2020-11-03 NOTE — Telephone Encounter (Signed)
Sounds good. I will see him tomorrow.

## 2020-11-03 NOTE — Telephone Encounter (Signed)
Pt fell at job and hit head; pt wants a referral to a neurologist or need to know what he should do pls contact [phone]

## 2020-11-04 ENCOUNTER — Ambulatory Visit (INDEPENDENT_AMBULATORY_CARE_PROVIDER_SITE_OTHER): Payer: Self-pay | Admitting: Student

## 2020-11-04 ENCOUNTER — Encounter: Payer: Self-pay | Admitting: Student

## 2020-11-04 ENCOUNTER — Other Ambulatory Visit: Payer: Self-pay

## 2020-11-04 VITALS — BP 125/87 | HR 100 | Temp 97.6°F | Ht 66.0 in | Wt 167.1 lb

## 2020-11-04 DIAGNOSIS — R569 Unspecified convulsions: Secondary | ICD-10-CM

## 2020-11-04 DIAGNOSIS — F0781 Postconcussional syndrome: Secondary | ICD-10-CM | POA: Insufficient documentation

## 2020-11-04 MED ORDER — NAPROXEN 500 MG PO TABS: 500.0000 mg | ORAL_TABLET | Freq: Two times a day (BID) | ORAL | 0 refills | Status: DC | PRN

## 2020-11-04 NOTE — Assessment & Plan Note (Signed)
Patient presents to clinic today for follow-up after recent ED visit following a fall at work. 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  He reports having multiple seizure-like activity after the fall.  He was evaluated in the ED where he had another episode of seizure-like activity.  CT head was negative for any acute abnormalities and patient was diagnosed with concussion with instructions to see a neurologist.  Patient returned to the ED the next day due to another seizure-like activity and tested positive for cocaine in his urine. Patient admits to snorting cocaine last week at a friend's party. States he went 2 years without using until recently and quit drinking 2.5 years ago. Patient continues to endorse headaches, mild dizziness, shakiness, weakness in his legs and feeling off balance while walking but denies any blurry vision, memory loss or neck pain. Patient currently walks with a cane to help stabilize his gait. Neuro exam essentially normal except an unstable gait without assistance with cane. [name] symptoms consistent with postconcussion syndrome. Patient was educated on the course of concussions and realistic expectations the next few days to weeks as symptoms can usually last from days to weeks.   Plan: --Patient instructed to rest, avoid strenuous activity, bright lights and try to get 7 to 8 hours of sleep daily. --Start naproxen 500 mg twice daily as needed for headache. Can alternate with OTC Tylenol --Referred to neurology --Appointment with financial counselor on 4/21 --Follow-up with pain management specialist Dr. Posey Pronto on 5/10.  --Follow-up with behavioral health provider on 5/25
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
## 2020-11-04 NOTE — Patient Instructions (Addendum)
Thank you, [name] for allowing [place] to provide your care today. Today we discussed your head injury and concussion.  You are still experiencing concussion symptoms which should improve slowly over the next few days to weeks.  Make sure to rest and get a good sleep each night.   I will call if any are abnormal. All of your labs can be accessed through "My Chart".  I have place a referrals to a Neurologist  I have ordered the following medication/changed the following medications:  1. Naproxen 500 mg twice daily as needed for headache. You can alternate this with OTC Tylenol  Please follow-up as needed  Should you have any questions or concerns please call the internal medicine clinic at [phone].    [name] Dibbles, MD, MPH [facility]   My Chart Access: [email or web address]   If you have not already done so, please get your COVID 19 vaccine  To schedule an appointment for a COVID vaccine choice any of the following: Go to [email or web address]   Go to [email or web address]                  Call [phone]                                     Call [phone] and select Option 2

## 2020-11-05 ENCOUNTER — Encounter: Payer: Self-pay | Admitting: Student

## 2020-11-05 ENCOUNTER — Telehealth: Payer: Self-pay

## 2020-11-05 NOTE — Telephone Encounter (Signed)
I have taken care of it. The front desk staff can print it out for him tomorrow. Thanks Dr. Marianna Payment.

## 2020-11-05 NOTE — Telephone Encounter (Signed)
Requesting a letter for work, please call pt back.  

## 2020-11-05 NOTE — Progress Notes (Signed)
Internal Medicine Clinic Attending  Case discussed with Dr. Coy Saunas  At the time of the visit.  We reviewed the resident's history and exam and pertinent patient test results.  I agree with the assessment, diagnosis, and plan of care documented in the resident's note.   Young patient with recent fall, head injury, concussion, complicated by bipolar disorder, substance use disorder, and seizure-like events. CT head has been reassuring, neuro exam today reassuring except for unsteady gait. The seizure-like activity is hard to characterize, the description is not consistent with true seizure, so we are not going to treat with AED at this time. We are working on supportive care for the post-concussive syndrome, and to better optimize his psychiatric care. I think neuro clinic follow up and potentially an MRI brain in the future may prove helpful given his complex symptoms, but he will have difficulty accessing these resources given lack of insurance. We will keep working with him and monitor for further symptoms.

## 2020-11-05 NOTE — Telephone Encounter (Signed)
Return pt's call - stated he needs a note stating he can return to work on Friday. He has an appt to see financial counselor tomorrow and wants to pick up the letter also.

## 2020-11-05 NOTE — Telephone Encounter (Signed)
Dr. Coy Saunas, I think you saw this patient recently.

## 2020-11-06 ENCOUNTER — Telehealth: Payer: Self-pay

## 2020-11-06 ENCOUNTER — Ambulatory Visit: Payer: Self-pay

## 2020-11-06 NOTE — Telephone Encounter (Signed)
pt is requesting a call back about his RS paper

## 2020-11-19 ENCOUNTER — Other Ambulatory Visit: Payer: Self-pay

## 2020-11-19 ENCOUNTER — Encounter (HOSPITAL_COMMUNITY): Payer: Self-pay | Admitting: Emergency Medicine

## 2020-11-19 ENCOUNTER — Ambulatory Visit (HOSPITAL_COMMUNITY)
Admission: EM | Admit: 2020-11-19 | Discharge: 2020-11-19 | Disposition: A | Discharge status: Home / Self Care | Payer: Self-pay

## 2020-11-19 DIAGNOSIS — K2920 Alcoholic gastritis without bleeding: Secondary | ICD-10-CM

## 2020-11-19 NOTE — ED Triage Notes (Addendum)
Pt presents with abdominal cramping and black stools xs 4 days. States Bowels are getting back to normal color. Denies any fever chills or vomiting. States feels like he had a stomach bug and was constipated.
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
## 2020-11-25 ENCOUNTER — Encounter: Payer: Self-pay | Attending: Physical Medicine & Rehabilitation | Admitting: Physical Medicine & Rehabilitation

## 2020-11-25 DIAGNOSIS — M79622 Pain in left upper arm: Secondary | ICD-10-CM | POA: Insufficient documentation

## 2020-11-25 DIAGNOSIS — R208 Other disturbances of skin sensation: Secondary | ICD-10-CM | POA: Insufficient documentation

## 2020-12-10 ENCOUNTER — Ambulatory Visit (HOSPITAL_COMMUNITY): Payer: Self-pay | Admitting: Psychiatry

## 2020-12-11 ENCOUNTER — Emergency Department (HOSPITAL_COMMUNITY): Admission: EM | Admit: 2020-12-11 | Discharge: 2020-12-11 | Discharge status: Against Medical Advice | Payer: Self-pay

## 2020-12-11 ENCOUNTER — Other Ambulatory Visit: Payer: Self-pay | Admitting: Physical Medicine & Rehabilitation

## 2020-12-11 DIAGNOSIS — G8929 Other chronic pain: Secondary | ICD-10-CM

## 2020-12-12 ENCOUNTER — Other Ambulatory Visit: Payer: Self-pay

## 2020-12-12 ENCOUNTER — Encounter (HOSPITAL_COMMUNITY): Payer: Self-pay | Admitting: Emergency Medicine

## 2020-12-12 ENCOUNTER — Emergency Department (HOSPITAL_COMMUNITY)
Admission: EM | Admit: 2020-12-12 | Discharge: 2020-12-13 | Disposition: A | Discharge status: Home / Self Care | Payer: Self-pay | Attending: Emergency Medicine | Admitting: Emergency Medicine

## 2020-12-12 DIAGNOSIS — F17210 Nicotine dependence, cigarettes, uncomplicated: Secondary | ICD-10-CM | POA: Insufficient documentation

## 2020-12-12 DIAGNOSIS — Z853 Personal history of malignant neoplasm of breast: Secondary | ICD-10-CM | POA: Insufficient documentation

## 2020-12-12 DIAGNOSIS — Z85528 Personal history of other malignant neoplasm of kidney: Secondary | ICD-10-CM | POA: Insufficient documentation

## 2020-12-12 DIAGNOSIS — R1084 Generalized abdominal pain: Secondary | ICD-10-CM | POA: Insufficient documentation

## 2020-12-12 DIAGNOSIS — F1120 Opioid dependence, uncomplicated: Secondary | ICD-10-CM | POA: Insufficient documentation

## 2020-12-12 DIAGNOSIS — R45851 Suicidal ideations: Secondary | ICD-10-CM | POA: Insufficient documentation

## 2020-12-12 DIAGNOSIS — Z79899 Other long term (current) drug therapy: Secondary | ICD-10-CM | POA: Insufficient documentation

## 2020-12-12 DIAGNOSIS — I10 Essential (primary) hypertension: Secondary | ICD-10-CM | POA: Insufficient documentation

## 2020-12-12 DIAGNOSIS — F10129 Alcohol abuse with intoxication, unspecified: Secondary | ICD-10-CM | POA: Insufficient documentation

## 2020-12-12 DIAGNOSIS — F1910 Other psychoactive substance abuse, uncomplicated: Secondary | ICD-10-CM

## 2020-12-12 DIAGNOSIS — Z8616 Personal history of COVID-19: Secondary | ICD-10-CM | POA: Insufficient documentation

## 2020-12-12 DIAGNOSIS — I2510 Atherosclerotic heart disease of native coronary artery without angina pectoris: Secondary | ICD-10-CM | POA: Insufficient documentation

## 2020-12-12 DIAGNOSIS — Y908 Blood alcohol level of 240 mg/100 ml or more: Secondary | ICD-10-CM | POA: Insufficient documentation

## 2020-12-12 DIAGNOSIS — F1920 Other psychoactive substance dependence, uncomplicated: Secondary | ICD-10-CM | POA: Insufficient documentation

## 2020-12-12 LAB — CBC WITH DIFFERENTIAL/PLATELET
Abs Immature Granulocytes: 0.01 10*3/uL (ref 0.00–0.07)
Basophils Absolute: 0 10*3/uL (ref 0.0–0.1)
Basophils Relative: 1 %
Eosinophils Absolute: 0 10*3/uL (ref 0.0–0.5)
Eosinophils Relative: 1 %
HCT: 47 % (ref 39.0–52.0)
Hemoglobin: 15.5 g/dL (ref 13.0–17.0)
Immature Granulocytes: 0 %
Lymphocytes Relative: 46 %
Lymphs Abs: 2.2 10*3/uL (ref 0.7–4.0)
MCH: 29.2 pg (ref 26.0–34.0)
MCHC: 33 g/dL (ref 30.0–36.0)
MCV: 88.5 fL (ref 80.0–100.0)
Monocytes Absolute: 0.6 10*3/uL (ref 0.1–1.0)
Monocytes Relative: 12 %
Neutro Abs: 1.9 10*3/uL (ref 1.7–7.7)
Neutrophils Relative %: 40 %
Platelets: 171 10*3/uL (ref 150–400)
RBC: 5.31 MIL/uL (ref 4.22–5.81)
RDW: 15.7 % — ABNORMAL HIGH (ref 11.5–15.5)
WBC: 4.7 10*3/uL (ref 4.0–10.5)
nRBC: 0 % (ref 0.0–0.2)

## 2020-12-12 LAB — COMPREHENSIVE METABOLIC PANEL
ALT: 69 U/L — ABNORMAL HIGH (ref 0–44)
AST: 116 U/L — ABNORMAL HIGH (ref 15–41)
Albumin: 3.8 g/dL (ref 3.5–5.0)
Alkaline Phosphatase: 54 U/L (ref 38–126)
Anion gap: 11 (ref 5–15)
BUN: 6 mg/dL (ref 6–20)
CO2: 23 mmol/L (ref 22–32)
Calcium: 8.7 mg/dL — ABNORMAL LOW (ref 8.9–10.3)
Chloride: 103 mmol/L (ref 98–111)
Creatinine, Ser: 0.95 mg/dL (ref 0.61–1.24)
GFR, Estimated: >60 mL/min (ref >60)
Glucose, Bld: 149 mg/dL — ABNORMAL HIGH (ref 70–99)
Potassium: 3.6 mmol/L (ref 3.5–5.1)
Sodium: 137 mmol/L (ref 135–145)
Total Bilirubin: 0.5 mg/dL (ref 0.3–1.2)
Total Protein: 6.9 g/dL (ref 6.5–8.1)

## 2020-12-12 LAB — SALICYLATE LEVEL: Salicylate Lvl: <7 mg/dL — ABNORMAL LOW (ref 7.0–30.0)

## 2020-12-12 LAB — ETHANOL: Alcohol, Ethyl (B): 281 mg/dL — ABNORMAL HIGH (ref <10)

## 2020-12-12 LAB — ACETAMINOPHEN LEVEL: Acetaminophen (Tylenol), Serum: <10 ug/mL — ABNORMAL LOW (ref 10–30)

## 2020-12-12 NOTE — ED Triage Notes (Signed)
Patient requesting detox for his alcoholism and drug addiction , denies SI or HI , no hallucinations .

## 2020-12-12 NOTE — ED Notes (Signed)
Pt changed into burgundy scrubs. A Fall Risk armband and yellow nonslip socks were placed on the patient due to pt's ETOH.

## 2020-12-12 NOTE — ED Provider Notes (Signed)
Emergency Medicine Provider Triage Evaluation Note  [name] , a 40 y.o. male  was evaluated in triage.  Pt complains of EtOH intoxication.  Patient states 1 hour prior to arrival.  States he drinks daily.  Has history of prior withdrawal seizures.  He also to have #4 trazodone #4 Seroquel to help him to sleep.  He denies any intent at self-harm.  He also uses cocaine regularly, last use 1 week ago.  Denies lightheadedness, dizziness, emesis.  Has any SI, HI, AVH. He would like to detox.  Review of Systems  Positive: Requesting Detox Negative: Vomiting, headache, SI, HI, AVH  Physical Exam  BP 117/84 (BP Location: Left Arm)   Pulse (!) 114   Temp 98.2 F (36.8 C) (Oral)   Resp 18   SpO2 95%  Gen:   Sleepy, arousable to voice, smells of EtOh Resp:  Normal effort  MSK:   Moves extremities without difficulty  Neuro:  Ambulatory without difficulty Psych:  Denies SI, HI, AVH Other:    Medical Decision Making  Medically screening exam initiated at 7:17 PM.  Appropriate orders placed.  [name] was informed that the remainder of the evaluation will be completed by another provider, this initial triage assessment does not replace that evaluation, and the importance of remaining in the ED until their evaluation is complete.  Patient here requesting detox.  He appears intoxicated currently.  Did take 4 trazodone, #4  Soquel 1 hour prior to arrival to help him to sleep.  Unsure of doses of these medications.  He denies any SI, HI, AVH.  100mg  Trazadone on MAR 300 mg Seroquel on MAR   Stasha Naraine A, PA-C 12/12/20 1921    Breck Coons, MD 12/12/20 2054

## 2020-12-13 LAB — URINALYSIS, ROUTINE W REFLEX MICROSCOPIC
Bacteria, UA: NONE SEEN
Bilirubin Urine: NEGATIVE
Glucose, UA: NEGATIVE mg/dL
Hgb urine dipstick: NEGATIVE
Ketones, ur: 5 mg/dL — AB
Leukocytes,Ua: NEGATIVE
Nitrite: NEGATIVE
Protein, ur: 100 mg/dL — AB
Specific Gravity, Urine: 1.029 (ref 1.005–1.030)
pH: 5 (ref 5.0–8.0)

## 2020-12-13 LAB — RAPID URINE DRUG SCREEN, HOSP PERFORMED
Amphetamines: POSITIVE — AB
Barbiturates: NOT DETECTED
Benzodiazepines: NOT DETECTED
Cocaine: POSITIVE — AB
Opiates: NOT DETECTED
Tetrahydrocannabinol: NOT DETECTED

## 2020-12-13 MED ORDER — LORAZEPAM 2 MG/ML IJ SOLN: 0.0000 mg | Freq: Two times a day (BID) | INTRAMUSCULAR | Status: DC

## 2020-12-13 MED ORDER — AMLODIPINE BESYLATE 5 MG PO TABS
10.0000 mg | ORAL_TABLET | Freq: Every day | ORAL | Status: DC
Start: 2020-12-13 — End: 2020-12-13
  Administered 2020-12-13: 10 mg via ORAL
  Filled 2020-12-13: qty 2

## 2020-12-13 MED ORDER — QUETIAPINE FUMARATE 200 MG PO TABS
300.0000 mg | ORAL_TABLET | Freq: Every day | ORAL | Status: DC
Start: 2020-12-13 — End: 2020-12-13

## 2020-12-13 MED ORDER — LORAZEPAM 2 MG/ML IJ SOLN
0.0000 mg | Freq: Four times a day (QID) | INTRAMUSCULAR | Status: DC
Start: 2020-12-13 — End: 2020-12-13

## 2020-12-13 MED ORDER — LAMOTRIGINE 25 MG PO TABS
75.0000 mg | ORAL_TABLET | Freq: Every day | ORAL | Status: DC
  Administered 2020-12-13: 75 mg via ORAL
  Filled 2020-12-13 (×2): qty 3

## 2020-12-13 MED ORDER — GABAPENTIN 300 MG PO CAPS
900.0000 mg | ORAL_CAPSULE | Freq: Three times a day (TID) | ORAL | Status: DC
  Administered 2020-12-13: 900 mg via ORAL
  Filled 2020-12-13: qty 3

## 2020-12-13 MED ORDER — THIAMINE HCL 100 MG/ML IJ SOLN: 100.0000 mg | Freq: Every day | INTRAMUSCULAR | Status: DC

## 2020-12-13 MED ORDER — LORAZEPAM 1 MG PO TABS: 0.0000 mg | ORAL_TABLET | Freq: Two times a day (BID) | ORAL | Status: DC

## 2020-12-13 MED ORDER — THIAMINE HCL 100 MG PO TABS
100.0000 mg | ORAL_TABLET | Freq: Every day | ORAL | Status: DC
  Administered 2020-12-13: 100 mg via ORAL
  Filled 2020-12-13: qty 1

## 2020-12-13 MED ORDER — BUSPIRONE HCL 10 MG PO TABS
10.0000 mg | ORAL_TABLET | Freq: Three times a day (TID) | ORAL | Status: DC
Start: 2020-12-13 — End: 2020-12-13
  Administered 2020-12-13: 10 mg via ORAL
  Filled 2020-12-13: qty 1

## 2020-12-13 MED ORDER — LORAZEPAM 1 MG PO TABS
0.0000 mg | ORAL_TABLET | Freq: Four times a day (QID) | ORAL | Status: DC
  Administered 2020-12-13 (×2): 2 mg via ORAL
  Filled 2020-12-13 (×2): qty 2

## 2020-12-13 NOTE — ED Notes (Signed)
Psych notified for TTS at this time. Waiting for response.

## 2020-12-13 NOTE — ED Notes (Signed)
Breakfast tray ordered at this time. Per dietary, tray is coming in 30-45mins.

## 2020-12-13 NOTE — ED Notes (Addendum)
Cleared for discharge at this time with follow up to behavioral health tom or the following day for detox. Pt denies SI/HI and AV hallucinations at this time. Pt called his mom to pick him up. Waiting for pt mom at this time.

## 2020-12-13 NOTE — Discharge Instructions (Signed)
[facility] - Admissions are currently completed Monday through Friday at [facility]; both appointments and walk-ins are accepted.  Any individual that is a [facility] resident may present for a substance abuse screening and assessment for admission.  A person may be referred by numerous sources or self-refer.   Potential clients will be screened for medical necessity and appropriateness for the program.  Clients must meet criteria for high-intensity residential treatment services.  If clinically appropriate, a client will continue with the comprehensive clinical assessment and intake process, as well as enrollment in the [facility].  Address: [address] [place], [place] [address] Admin Hours: Mon-Fri 8AM to [place] Hours: 24/7 Phone: [phone] Fax: [phone]  [facility] (Detox) Facility Based Crisis:  These are 3 locations for services: Please call before arrival   Address: [address] [place], [place] [address] Phone: [phone]  Address: [address] [place], [place] [address] Phone#: [phone]  Address: [address] [place], [place] [address] Phone#: [phone]   [facility] (ADS): (offers outpatient therapy and intensive outpatient substance abuse therapy).  [address] [place], [place] [address] Phone: [phone]  [place]: Offers FREE recovery skills classes, support groups, 1:1 Peer Support, and Compeer Classes. [address] [place], [place] [address] Phone: [phone] (Call to complete intake).  [facility] [address] [place], [place] [address] Phone: [phone] ext: [place] provides food, shelter and other programs and services to the homeless men of [place] through our [employer].  By offering safe shelter, three meals a day, clean clothing, Biblical counseling,  financial planning, vocational training, GED/education and employment assistance, we've helped mend the shattered lives of many homeless men since opening in 1974.  We have approximately 267 beds available, with a max of 312 beds including mats for emergency situations and currently house an average of 270 men a night.  Prospective Client Check-In Information Photo ID Required (State/ Out of State/ [facility]) - if photo ID is not available, clients are required to have a printout of a police/sheriff's criminal history report. Help out with chores around the [place]. No sex offender of any type (pending, charged, registered and/or any other sex related offenses) will be permitted to check in. Must be willing to abide by all rules, regulations, and policies established by the [employer]. The following will be provided - shelter, food, clothing, and biblical counseling. If you or someone you know is in need of assistance at our [facility] shelter in [place], [place], please call [phone] ext. [phone].  [facility]-will provide timely access to mental health services for children and adolescents (4-17) and adults presenting in a mental health crisis. The program is designed for those who need urgent Behavioral Health or Substance Use treatment and are not experiencing a medical crisis that would typically require an emergency room visit.    [place],  [address] Phone: [phone] [email or web address]: Phone#: [phone]  The [facility] SA Intensive Outpatient Program (SAIOP) means structured individual and group addiction activities and services that are provided at an outpatient program designed to assist adult and adolescent consumers to begin recovery and learn skills for recovery maintenance. The [place] program is offered at least 3 hours a day, 3 days a week.SAIOP services shall  include a structured program consisting of,  but not limited to, the following services: Individual counseling and support; Group counseling and support; Family counseling, training or support; Biochemical assays to identify recent drug use (e.g., urine drug screens); Strategies for relapse prevention to include community and social support systems in treatment; Life skills; Crisis contingency planning; Disease Management; and Treatment support activities that have been adapted or specifically designed for persons with physical disabilities, or persons with co-occurring disorders of mental illness and substance abuse/dependence or mental retardation/developmental disability and substance abuse/dependence. Phone: [phone]  Address:   The [facility] will also offer the following outpatient services: (Monday through Friday 8am-5pm)    Partial Hospitalization Program (PHP)  Substance Abuse Intensive Outpatient Program (SA-IOP)  Group Therapy  Medication Management  Peer Living Room We also provide (24/7):  Assessments: Our mental health clinician and providers will conduct a focused mental health evaluation, assessing for immediate safety concerns and further mental health needs. Referral: Our team will provide resources and help connect to community based mental health treatment, when indicated, including psychotherapy, psychiatry, and other specialized behavioral health or substance use disorder services (for those not already in treatment). Transitional Care: Our team providers in person bridging and/or telephonic follow-up during the patient's transition to outpatient services.  The [facility] 24-Hour Call Center: [phone] Behavioral Health Crisis Line: [phone]

## 2020-12-13 NOTE — Progress Notes (Signed)
CSW contacted the patient in reference to a request to detox treatment. The patient advised that he would like to seek detox or substance abuse treatment. CSW advised about sending a referral to [facility] in [place] to be reviewed for possible placement. The patient provided information for the referral and informed CSW that he would like his mother, Kalman Shan Richardson/Phone#: [phone], to be contact for further information. CSW contacted Rose who reported her son is currently staying in a boarding house and is receiving services through [facility]. CSW faxed referral to [facility] based crisis located in [place], [place]. Additionally, CSW provided resources in AVS listed below.   Resources:   [facility] - Admissions are currently completed Monday through Friday at [facility]; both appointments and walk-ins are accepted.  Any individual that is a [facility] resident may present for a substance abuse screening and assessment for admission.  A person may be referred by numerous sources or self-refer.   Potential clients will be screened for medical necessity and appropriateness for the program.  Clients must meet criteria for high-intensity residential treatment services.  If clinically appropriate, a client will continue with the comprehensive clinical assessment and intake process, as well as enrollment in the [facility].  Address: [address] [place], [place] [address] Admin Hours: Mon-Fri 8AM to [place] Hours: 24/7 Phone: [phone] Fax: [phone]  [facility] (Detox) Facility Based Crisis:  These are 3 locations for services: Please call before arrival   Address: [address] [place], [place] [address] Phone: [phone]  Address: [address] [place], [place] [address] Phone#: [phone]  Address: [address] [place], [place] [address] Phone#: [phone]   [facility]  Drug Services (ADS): (offers outpatient therapy and intensive outpatient substance abuse therapy).  [address], [place], [place] [address] Phone: [phone]  [place]: Offers FREE recovery skills classes, support groups, 1:1 Peer Support, and Compeer Classes. [address], [place], [place] [address] Phone: [phone] (Call to complete intake).  [facility] [address] [place], [place] [address] Phone: [phone] ext: [place] provides food, shelter and other programs and services to the homeless men of [place] through our [employer].  By offering safe shelter, three meals a day, clean clothing, Biblical counseling, financial planning, vocational training, GED/education and employment assistance, we've helped mend the shattered lives of many homeless men since opening in 1974.  We have approximately 267 beds available, with a max of 312 beds including mats for emergency situations and currently house an average of 270 men a night.  Prospective Client Check-In Information Photo ID Required (State/ Out of State/ [facility]) - if photo ID is not available, clients are required to have a printout of a police/sheriff's criminal history report. Help out with chores around the [place]. No sex offender of any type (pending, charged, registered and/or any other sex related offenses) will be permitted to check in. Must be willing to abide by all rules, regulations, and policies established by the [employer]. The following will be provided - shelter, food, clothing, and biblical counseling. If you or someone you know is in need of assistance at our [facility] shelter in [place], [place], please call [phone] ext. [phone].  [facility]-will provide timely access to mental health services for children and adolescents (4-17) and adults presenting in a mental health crisis.  The  program is designed for those who need urgent Behavioral Health or Substance Use treatment and are not experiencing a medical crisis that would typically require an emergency room visit.    [place], [place] [address] Phone: [phone] [email or web address]: Phone#: [phone]  The [facility] SA Intensive Outpatient Program (SAIOP) means structured individual and group addiction activities and services that are provided at an outpatient program designed to assist adult and adolescent consumers to begin recovery and learn skills for recovery maintenance. The [facility] program is offered at least 3 hours a day, 3 days a week.SAIOP services shall include a structured program consisting of, but not limited to, the following services: Individual counseling and support; Group counseling and support; Family counseling, training or support; Biochemical assays to identify recent drug use (e.g., urine drug screens); Strategies for relapse prevention to include community and social support systems in treatment; Life skills; Crisis contingency planning; Disease Management; and Treatment support activities that have been adapted or specifically designed for persons with physical disabilities, or persons with co-occurring disorders of mental illness and substance abuse/dependence or mental retardation/developmental disability and substance abuse/dependence. Phone: [phone]  Address:   The [facility] will also offer the following outpatient services: (Monday through Friday 8am-5pm)    Partial Hospitalization Program (PHP)  Substance Abuse Intensive Outpatient Program (SA-IOP)  Group Therapy  Medication Management  Peer Living Room We also provide (24/7):  Assessments: Our mental health clinician and providers will conduct a focused mental health evaluation, assessing for immediate safety concerns and further mental  health needs. Referral: Our team will provide resources and help connect to community based mental health treatment, when indicated, including psychotherapy, psychiatry, and other specialized behavioral health or substance use disorder services (for those not already in treatment). Transitional Care: Our team providers in person bridging and/or telephonic follow-up during the patient's transition to outpatient services.  The [place]: [phone] Behavioral Health Crisis Line: [phone]    [place]ie [place], MSW, [place], [place] Phone: [phone] Disposition/TOC
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
## 2020-12-13 NOTE — BH Assessment (Signed)
8:15 TTS contacted cart to complete consult,  Cart was in the  purple area and was advised by staff to chat the Nurse in patient area to come get the cart. chatted the Medical Team no response.

## 2020-12-17 ENCOUNTER — Encounter: Payer: Self-pay | Admitting: Internal Medicine

## 2021-01-03 ENCOUNTER — Inpatient Hospital Stay (HOSPITAL_COMMUNITY)
Admission: EM | Admit: 2021-01-03 | Discharge: 2021-01-07 | DRG: 917 | Disposition: A | Discharge status: Other Acute Inpatient Hospital | Payer: Self-pay | Attending: Family Medicine | Admitting: Family Medicine

## 2021-01-03 ENCOUNTER — Other Ambulatory Visit: Payer: Self-pay

## 2021-01-03 ENCOUNTER — Emergency Department (HOSPITAL_COMMUNITY): Payer: Self-pay

## 2021-01-03 ENCOUNTER — Inpatient Hospital Stay (HOSPITAL_COMMUNITY): Payer: Self-pay

## 2021-01-03 DIAGNOSIS — Z91018 Allergy to other foods: Secondary | ICD-10-CM

## 2021-01-03 DIAGNOSIS — Z885 Allergy status to narcotic agent status: Secondary | ICD-10-CM

## 2021-01-03 DIAGNOSIS — Z888 Allergy status to other drugs, medicaments and biological substances status: Secondary | ICD-10-CM

## 2021-01-03 DIAGNOSIS — E876 Hypokalemia: Secondary | ICD-10-CM | POA: Diagnosis not present

## 2021-01-03 DIAGNOSIS — Z818 Family history of other mental and behavioral disorders: Secondary | ICD-10-CM

## 2021-01-03 DIAGNOSIS — Y9259 Other trade areas as the place of occurrence of the external cause: Secondary | ICD-10-CM

## 2021-01-03 DIAGNOSIS — Z91410 Personal history of adult physical and sexual abuse: Secondary | ICD-10-CM

## 2021-01-03 DIAGNOSIS — E7800 Pure hypercholesterolemia, unspecified: Secondary | ICD-10-CM | POA: Diagnosis present

## 2021-01-03 DIAGNOSIS — Z905 Acquired absence of kidney: Secondary | ICD-10-CM

## 2021-01-03 DIAGNOSIS — Z560 Unemployment, unspecified: Secondary | ICD-10-CM

## 2021-01-03 DIAGNOSIS — Z5900 Homelessness unspecified: Secondary | ICD-10-CM

## 2021-01-03 DIAGNOSIS — Z85528 Personal history of other malignant neoplasm of kidney: Secondary | ICD-10-CM

## 2021-01-03 DIAGNOSIS — Z8616 Personal history of COVID-19: Secondary | ICD-10-CM

## 2021-01-03 DIAGNOSIS — F17210 Nicotine dependence, cigarettes, uncomplicated: Secondary | ICD-10-CM | POA: Diagnosis present

## 2021-01-03 DIAGNOSIS — G928 Other toxic encephalopathy: Secondary | ICD-10-CM | POA: Diagnosis present

## 2021-01-03 DIAGNOSIS — Z8249 Family history of ischemic heart disease and other diseases of the circulatory system: Secondary | ICD-10-CM

## 2021-01-03 DIAGNOSIS — I739 Peripheral vascular disease, unspecified: Secondary | ICD-10-CM | POA: Diagnosis present

## 2021-01-03 DIAGNOSIS — Z20822 Contact with and (suspected) exposure to covid-19: Secondary | ICD-10-CM | POA: Diagnosis present

## 2021-01-03 DIAGNOSIS — J9600 Acute respiratory failure, unspecified whether with hypoxia or hypercapnia: Secondary | ICD-10-CM

## 2021-01-03 DIAGNOSIS — I10 Essential (primary) hypertension: Secondary | ICD-10-CM | POA: Diagnosis present

## 2021-01-03 DIAGNOSIS — Y908 Blood alcohol level of 240 mg/100 ml or more: Secondary | ICD-10-CM | POA: Diagnosis present

## 2021-01-03 DIAGNOSIS — Z79899 Other long term (current) drug therapy: Secondary | ICD-10-CM

## 2021-01-03 DIAGNOSIS — F10239 Alcohol dependence with withdrawal, unspecified: Secondary | ICD-10-CM | POA: Diagnosis not present

## 2021-01-03 DIAGNOSIS — Z853 Personal history of malignant neoplasm of breast: Secondary | ICD-10-CM

## 2021-01-03 DIAGNOSIS — Z9151 Personal history of suicidal behavior: Secondary | ICD-10-CM

## 2021-01-03 DIAGNOSIS — F333 Major depressive disorder, recurrent, severe with psychotic symptoms: Secondary | ICD-10-CM | POA: Diagnosis present

## 2021-01-03 DIAGNOSIS — D696 Thrombocytopenia, unspecified: Secondary | ICD-10-CM | POA: Diagnosis present

## 2021-01-03 DIAGNOSIS — R680 Hypothermia, not associated with low environmental temperature: Secondary | ICD-10-CM | POA: Diagnosis present

## 2021-01-03 DIAGNOSIS — E860 Dehydration: Secondary | ICD-10-CM | POA: Diagnosis present

## 2021-01-03 DIAGNOSIS — J9690 Respiratory failure, unspecified, unspecified whether with hypoxia or hypercapnia: Secondary | ICD-10-CM | POA: Diagnosis present

## 2021-01-03 DIAGNOSIS — Z978 Presence of other specified devices: Secondary | ICD-10-CM

## 2021-01-03 DIAGNOSIS — Z9013 Acquired absence of bilateral breasts and nipples: Secondary | ICD-10-CM

## 2021-01-03 DIAGNOSIS — R000 Tachycardia, unspecified: Secondary | ICD-10-CM | POA: Diagnosis not present

## 2021-01-03 DIAGNOSIS — Z0189 Encounter for other specified special examinations: Secondary | ICD-10-CM

## 2021-01-03 DIAGNOSIS — T43212A Poisoning by selective serotonin and norepinephrine reuptake inhibitors, intentional self-harm, initial encounter: Principal | ICD-10-CM | POA: Diagnosis present

## 2021-01-03 DIAGNOSIS — E872 Acidosis: Secondary | ICD-10-CM | POA: Diagnosis present

## 2021-01-03 DIAGNOSIS — T391X1A Poisoning by 4-Aminophenol derivatives, accidental (unintentional), initial encounter: Secondary | ICD-10-CM | POA: Diagnosis present

## 2021-01-03 DIAGNOSIS — G40909 Epilepsy, unspecified, not intractable, without status epilepticus: Secondary | ICD-10-CM | POA: Diagnosis present

## 2021-01-03 DIAGNOSIS — F323 Major depressive disorder, single episode, severe with psychotic features: Secondary | ICD-10-CM | POA: Diagnosis present

## 2021-01-03 DIAGNOSIS — T405X1A Poisoning by cocaine, accidental (unintentional), initial encounter: Secondary | ICD-10-CM | POA: Diagnosis present

## 2021-01-03 DIAGNOSIS — I2510 Atherosclerotic heart disease of native coronary artery without angina pectoris: Secondary | ICD-10-CM | POA: Diagnosis present

## 2021-01-03 DIAGNOSIS — Z880 Allergy status to penicillin: Secondary | ICD-10-CM

## 2021-01-03 DIAGNOSIS — J9601 Acute respiratory failure with hypoxia: Secondary | ICD-10-CM | POA: Diagnosis present

## 2021-01-03 DIAGNOSIS — F10229 Alcohol dependence with intoxication, unspecified: Secondary | ICD-10-CM | POA: Diagnosis present

## 2021-01-03 DIAGNOSIS — K7460 Unspecified cirrhosis of liver: Secondary | ICD-10-CM | POA: Diagnosis present

## 2021-01-03 DIAGNOSIS — T43592A Poisoning by other antipsychotics and neuroleptics, intentional self-harm, initial encounter: Secondary | ICD-10-CM | POA: Diagnosis present

## 2021-01-03 DIAGNOSIS — Z83438 Family history of other disorder of lipoprotein metabolism and other lipidemia: Secondary | ICD-10-CM

## 2021-01-03 DIAGNOSIS — Z823 Family history of stroke: Secondary | ICD-10-CM

## 2021-01-03 DIAGNOSIS — T5192XA Toxic effect of unspecified alcohol, intentional self-harm, initial encounter: Secondary | ICD-10-CM | POA: Diagnosis present

## 2021-01-03 DIAGNOSIS — F419 Anxiety disorder, unspecified: Secondary | ICD-10-CM | POA: Diagnosis present

## 2021-01-03 LAB — MRSA PCR SCREENING: MRSA by PCR: NEGATIVE

## 2021-01-03 LAB — BLOOD GAS, ARTERIAL
Acid-base deficit: 1.1 mmol/L (ref 0.0–2.0)
Bicarbonate: 21.3 mmol/L (ref 20.0–28.0)
FIO2: 100
O2 Saturation: 100 %
Patient temperature: 95.9
pCO2 arterial: 28.2 mmHg — ABNORMAL LOW (ref 32.0–48.0)
pH, Arterial: 7.484 — ABNORMAL HIGH (ref 7.350–7.450)
pO2, Arterial: 267 mmHg — ABNORMAL HIGH (ref 83.0–108.0)

## 2021-01-03 LAB — RAPID URINE DRUG SCREEN, HOSP PERFORMED
Amphetamines: NOT DETECTED
Barbiturates: NOT DETECTED
Benzodiazepines: POSITIVE — AB
Cocaine: POSITIVE — AB
Opiates: NOT DETECTED
Tetrahydrocannabinol: NOT DETECTED

## 2021-01-03 LAB — CBC WITH DIFFERENTIAL/PLATELET
Abs Immature Granulocytes: 0 10*3/uL (ref 0.00–0.07)
Basophils Absolute: 0 10*3/uL (ref 0.0–0.1)
Basophils Relative: 0 %
Eosinophils Absolute: 0 10*3/uL (ref 0.0–0.5)
Eosinophils Relative: 1 %
HCT: 39.2 % (ref 39.0–52.0)
Hemoglobin: 13.3 g/dL (ref 13.0–17.0)
Immature Granulocytes: 0 %
Lymphocytes Relative: 60 %
Lymphs Abs: 2.1 10*3/uL (ref 0.7–4.0)
MCH: 29.4 pg (ref 26.0–34.0)
MCHC: 33.9 g/dL (ref 30.0–36.0)
MCV: 86.5 fL (ref 80.0–100.0)
Monocytes Absolute: 0.4 10*3/uL (ref 0.1–1.0)
Monocytes Relative: 10 %
Neutro Abs: 1 10*3/uL — ABNORMAL LOW (ref 1.7–7.7)
Neutrophils Relative %: 29 %
Platelets: 127 10*3/uL — ABNORMAL LOW (ref 150–400)
RBC: 4.53 MIL/uL (ref 4.22–5.81)
RDW: 16 % — ABNORMAL HIGH (ref 11.5–15.5)
WBC: 3.5 10*3/uL — ABNORMAL LOW (ref 4.0–10.5)
nRBC: 0 % (ref 0.0–0.2)

## 2021-01-03 LAB — COMPREHENSIVE METABOLIC PANEL
ALT: 53 U/L — ABNORMAL HIGH (ref 0–44)
AST: 110 U/L — ABNORMAL HIGH (ref 15–41)
Albumin: 3.6 g/dL (ref 3.5–5.0)
Alkaline Phosphatase: 52 U/L (ref 38–126)
Anion gap: 10 (ref 5–15)
BUN: 7 mg/dL (ref 6–20)
CO2: 26 mmol/L (ref 22–32)
Calcium: 8.1 mg/dL — ABNORMAL LOW (ref 8.9–10.3)
Chloride: 107 mmol/L (ref 98–111)
Creatinine, Ser: 0.7 mg/dL (ref 0.61–1.24)
GFR, Estimated: >60 mL/min (ref >60)
Glucose, Bld: 98 mg/dL (ref 70–99)
Potassium: 2.8 mmol/L — ABNORMAL LOW (ref 3.5–5.1)
Sodium: 143 mmol/L (ref 135–145)
Total Bilirubin: 0.4 mg/dL (ref 0.3–1.2)
Total Protein: 6.5 g/dL (ref 6.5–8.1)

## 2021-01-03 LAB — BASIC METABOLIC PANEL
Anion gap: 9 (ref 5–15)
BUN: 5 mg/dL — ABNORMAL LOW (ref 6–20)
CO2: 23 mmol/L (ref 22–32)
Calcium: 7.8 mg/dL — ABNORMAL LOW (ref 8.9–10.3)
Chloride: 110 mmol/L (ref 98–111)
Creatinine, Ser: 0.73 mg/dL (ref 0.61–1.24)
GFR, Estimated: >60 mL/min (ref >60)
Glucose, Bld: 94 mg/dL (ref 70–99)
Potassium: 3.7 mmol/L (ref 3.5–5.1)
Sodium: 142 mmol/L (ref 135–145)

## 2021-01-03 LAB — ETHANOL: Alcohol, Ethyl (B): 343 mg/dL

## 2021-01-03 LAB — URINALYSIS, COMPLETE (UACMP) WITH MICROSCOPIC
Bilirubin Urine: NEGATIVE
Glucose, UA: NEGATIVE mg/dL
Hgb urine dipstick: NEGATIVE
Ketones, ur: 5 mg/dL — AB
Leukocytes,Ua: NEGATIVE
Nitrite: NEGATIVE
Protein, ur: NEGATIVE mg/dL
Specific Gravity, Urine: 1.009 (ref 1.005–1.030)
pH: 6 (ref 5.0–8.0)

## 2021-01-03 LAB — MAGNESIUM
Magnesium: 1.7 mg/dL (ref 1.7–2.4)
Magnesium: 1.6 mg/dL — ABNORMAL LOW (ref 1.7–2.4)

## 2021-01-03 LAB — PHOSPHORUS: Phosphorus: 2.2 mg/dL — ABNORMAL LOW (ref 2.5–4.6)

## 2021-01-03 LAB — AMMONIA: Ammonia: 16 umol/L (ref 9–35)

## 2021-01-03 LAB — RESP PANEL BY RT-PCR (FLU A&B, COVID) ARPGX2
Influenza A by PCR: NEGATIVE
Influenza B by PCR: NEGATIVE
SARS Coronavirus 2 by RT PCR: NEGATIVE

## 2021-01-03 LAB — ACETAMINOPHEN LEVEL: Acetaminophen (Tylenol), Serum: <10 ug/mL — ABNORMAL LOW (ref 10–30)

## 2021-01-03 LAB — LACTIC ACID, PLASMA: Lactic Acid, Venous: 2.2 mmol/L (ref 0.5–1.9)

## 2021-01-03 LAB — PROCALCITONIN: Procalcitonin: <0.1 ng/mL

## 2021-01-03 MED ORDER — HEPARIN SODIUM (PORCINE) 5000 UNIT/ML IJ SOLN
5000.0000 [IU] | Freq: Three times a day (TID) | INTRAMUSCULAR | Status: DC
  Administered 2021-01-03 – 2021-01-06 (×8): 5000 [IU] via SUBCUTANEOUS
  Filled 2021-01-03 (×8): qty 1

## 2021-01-03 MED ORDER — MAGNESIUM SULFATE 2 GM/50ML IV SOLN
2.0000 g | Freq: Once | INTRAVENOUS | Status: AC
  Administered 2021-01-03: 2 g via INTRAVENOUS
  Filled 2021-01-03: qty 50

## 2021-01-03 MED ORDER — POTASSIUM CHLORIDE 10 MEQ/100ML IV SOLN
10.0000 meq | Freq: Once | INTRAVENOUS | Status: AC
  Administered 2021-01-03: 10 meq via INTRAVENOUS
  Filled 2021-01-03: qty 100

## 2021-01-03 MED ORDER — MIDAZOLAM HCL 2 MG/2ML IJ SOLN
2.0000 mg | INTRAMUSCULAR | Status: DC | PRN
  Filled 2021-01-03 (×3): qty 2

## 2021-01-03 MED ORDER — DEXTROSE IN LACTATED RINGERS 5 % IV SOLN: INTRAVENOUS | Status: DC

## 2021-01-03 MED ORDER — PROPOFOL 1000 MG/100ML IV EMUL
INTRAVENOUS | Status: AC
  Filled 2021-01-03: qty 100

## 2021-01-03 MED ORDER — NALOXONE HCL 2 MG/2ML IJ SOSY
2.0000 mg | PREFILLED_SYRINGE | Freq: Once | INTRAMUSCULAR | Status: AC
  Administered 2021-01-03: 2 mg via INTRAVENOUS

## 2021-01-03 MED ORDER — DOCUSATE SODIUM 50 MG/5ML PO LIQD
100.0000 mg | Freq: Two times a day (BID) | ORAL | Status: DC
  Administered 2021-01-03 – 2021-01-06 (×6): 100 mg
  Filled 2021-01-03 (×8): qty 10

## 2021-01-03 MED ORDER — PANTOPRAZOLE SODIUM 40 MG IV SOLR
40.0000 mg | Freq: Every day | INTRAVENOUS | Status: DC
  Administered 2021-01-03 – 2021-01-05 (×3): 40 mg via INTRAVENOUS
  Filled 2021-01-03 (×3): qty 40

## 2021-01-03 MED ORDER — LACTATED RINGERS IV BOLUS
500.0000 mL | Freq: Once | INTRAVENOUS | Status: AC
  Administered 2021-01-03: 500 mL via INTRAVENOUS

## 2021-01-03 MED ORDER — POLYETHYLENE GLYCOL 3350 17 G PO PACK: 17.0000 g | PACK | Freq: Every day | ORAL | Status: DC | PRN

## 2021-01-03 MED ORDER — CHLORHEXIDINE GLUCONATE CLOTH 2 % EX PADS
6.0000 | MEDICATED_PAD | Freq: Every day | CUTANEOUS | Status: DC
  Administered 2021-01-03 – 2021-01-07 (×5): 6 via TOPICAL

## 2021-01-03 MED ORDER — PROPOFOL 1000 MG/100ML IV EMUL
0.0000 ug/kg/min | INTRAVENOUS | Status: DC
  Administered 2021-01-03 (×2): 40 ug/kg/min via INTRAVENOUS
  Administered 2021-01-03: 50 ug/kg/min via INTRAVENOUS
  Administered 2021-01-04: 60 ug/kg/min via INTRAVENOUS
  Administered 2021-01-04: 50 ug/kg/min via INTRAVENOUS
  Administered 2021-01-04 (×2): 60 ug/kg/min via INTRAVENOUS
  Administered 2021-01-04 (×2): 50 ug/kg/min via INTRAVENOUS
  Filled 2021-01-03: qty 100
  Filled 2021-01-03: qty 200
  Filled 2021-01-03 (×7): qty 100

## 2021-01-03 MED ORDER — SODIUM CHLORIDE 0.9 % IV BOLUS
1000.0000 mL | Freq: Once | INTRAVENOUS | Status: AC
  Administered 2021-01-03: 1000 mL via INTRAVENOUS

## 2021-01-03 MED ORDER — CHLORHEXIDINE GLUCONATE 0.12% ORAL RINSE (MEDLINE KIT)
15.0000 mL | Freq: Two times a day (BID) | OROMUCOSAL | Status: DC
  Administered 2021-01-03 – 2021-01-05 (×5): 15 mL via OROMUCOSAL

## 2021-01-03 MED ORDER — MIDAZOLAM HCL 2 MG/2ML IJ SOLN
2.0000 mg | INTRAMUSCULAR | Status: DC | PRN
  Administered 2021-01-04 (×2): 2 mg via INTRAVENOUS

## 2021-01-03 MED ORDER — POTASSIUM CHLORIDE 20 MEQ PO PACK
40.0000 meq | PACK | Freq: Every day | ORAL | Status: DC
  Administered 2021-01-03 – 2021-01-06 (×4): 40 meq
  Filled 2021-01-03 (×4): qty 2

## 2021-01-03 MED ORDER — FENTANYL CITRATE (PF) 100 MCG/2ML IJ SOLN: 50.0000 ug | INTRAMUSCULAR | Status: DC | PRN

## 2021-01-03 MED ORDER — POLYETHYLENE GLYCOL 3350 17 G PO PACK
17.0000 g | PACK | Freq: Every day | ORAL | Status: DC
  Administered 2021-01-04 – 2021-01-05 (×2): 17 g
  Filled 2021-01-03 (×3): qty 1

## 2021-01-03 MED ORDER — SODIUM CHLORIDE 0.9 % IV SOLN
INTRAVENOUS | Status: DC | PRN
  Administered 2021-01-03: 500 mL via INTRAVENOUS

## 2021-01-03 MED ORDER — DOCUSATE SODIUM 100 MG PO CAPS: 100.0000 mg | ORAL_CAPSULE | Freq: Two times a day (BID) | ORAL | Status: DC | PRN

## 2021-01-03 MED ORDER — MIDAZOLAM HCL 2 MG/2ML IJ SOLN
1.0000 mg | INTRAMUSCULAR | Status: DC | PRN
  Administered 2021-01-03: 2 mg via INTRAVENOUS
  Filled 2021-01-03: qty 4

## 2021-01-03 MED ORDER — FENTANYL CITRATE (PF) 100 MCG/2ML IJ SOLN
50.0000 ug | INTRAMUSCULAR | Status: AC | PRN
  Administered 2021-01-04 (×3): 50 ug via INTRAVENOUS

## 2021-01-03 MED ORDER — ORAL CARE MOUTH RINSE
15.0000 mL | OROMUCOSAL | Status: DC
  Administered 2021-01-03 – 2021-01-05 (×20): 15 mL via OROMUCOSAL

## 2021-01-03 MED ORDER — FENTANYL CITRATE (PF) 100 MCG/2ML IJ SOLN
50.0000 ug | INTRAMUSCULAR | Status: DC | PRN
  Administered 2021-01-04 (×5): 100 ug via INTRAVENOUS
  Administered 2021-01-04: 50 ug via INTRAVENOUS
  Administered 2021-01-04 – 2021-01-05 (×5): 100 ug via INTRAVENOUS
  Filled 2021-01-03 (×12): qty 2

## 2021-01-03 NOTE — ED Notes (Signed)
Pt intermittently responsive to verbal stimuli.

## 2021-01-03 NOTE — ED Notes (Signed)
20mg  Etomidate given per provider's verbal order.

## 2021-01-03 NOTE — ED Notes (Signed)
2mg  Narcan given IV per provider's verbal order.

## 2021-01-03 NOTE — ED Notes (Signed)
Bear hugger placed on pt set to medium heat (38C).

## 2021-01-03 NOTE — ED Notes (Signed)
Propofol increased to 7.59mcg/kg/min d/t pt bucking ET tube.

## 2021-01-03 NOTE — ED Notes (Signed)
Liter bolus of NS started per provider's verbal order.

## 2021-01-03 NOTE — ED Notes (Signed)
Propofol increased to 10.21mcg/kg/min.

## 2021-01-03 NOTE — Progress Notes (Signed)
ETT advanced 2.5cm to 26cm at the lip.

## 2021-01-03 NOTE — ED Notes (Signed)
Dr. Roderic Palau at the bedside to evaluate.

## 2021-01-03 NOTE — ED Triage Notes (Addendum)
Per EMS, patient from motel, reported taking Seroquel and trazodone this morning as a suicide attempt. Reported taking "20 pills."   18g R FA 1038ml NS 4mg  Zofran

## 2021-01-03 NOTE — ED Notes (Addendum)
Intubation complete. 7.5 et tube. 25 at the lip.
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
## 2021-01-03 NOTE — ED Notes (Signed)
Provider at head of the bed to intubate.

## 2021-01-03 NOTE — ED Notes (Signed)
Dr. Roderic Palau made aware critical ETOH 343.
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
## 2021-01-03 NOTE — Progress Notes (Signed)
eLink Physician-Brief Progress Note Patient Name: [name] DOB: 1981-02-18 MRN: [id number]   Date of Service  01/03/2021  HPI/Events of Note  Notified of persistent tachycardia that did not improve with fluid bolus given earlier. K 2.8, Mg 1.7 earlier today QT prolongation has resolved on repeat EKG  eICU Interventions  Check BMP, Mg and phos stat and replace prn Continue with current fluid resuscitation        [name] 01/03/2021, 9:49 PM

## 2021-01-03 NOTE — ED Notes (Signed)
100mg  succ given per provider's verbal order.

## 2021-01-03 NOTE — ED Notes (Signed)
Dr. Roderic Palau aware critical lactic 2.2.

## 2021-01-03 NOTE — ED Notes (Signed)
Additional 2mg  Narcan given per Dr. Ellsworth Lennox verbal order.

## 2021-01-03 NOTE — ED Notes (Signed)
Propofol increased to 12.gmcg/kg/min d/t pt attempting to pull ET tube.

## 2021-01-03 NOTE — ED Notes (Addendum)
Provider notified of pt's temp of 94.70F via monitor from temp sensing foley. Provider aware. No additional orders at this time. Bear hugger applied.

## 2021-01-03 NOTE — Progress Notes (Signed)
Patient transported on vent from ED to room 5597 without complications.

## 2021-01-03 NOTE — ED Notes (Signed)
RT at the bedside.

## 2021-01-03 NOTE — ED Notes (Addendum)
OG tube inserted. Placement verified with stethoscope auscultation. Return of stomach contents.

## 2021-01-03 NOTE — ED Notes (Addendum)
Propofol started 76mcg/kg/min per provider's order. Pt wrists placed in soft restraints d/t attempting to pull ET tube despite sedation.

## 2021-01-03 NOTE — ED Notes (Signed)
OPA inserted by ED provider.

## 2021-01-03 NOTE — ED Notes (Signed)
Propofol increased to 15 mcg/kg/min d/t pt attempting to pull ET tube. Provider notified.

## 2021-01-04 ENCOUNTER — Other Ambulatory Visit: Payer: Self-pay

## 2021-01-04 ENCOUNTER — Inpatient Hospital Stay (HOSPITAL_COMMUNITY): Payer: Self-pay

## 2021-01-04 LAB — BASIC METABOLIC PANEL
Anion gap: 7 (ref 5–15)
BUN: <5 mg/dL — ABNORMAL LOW (ref 6–20)
CO2: 26 mmol/L (ref 22–32)
Calcium: 8.5 mg/dL — ABNORMAL LOW (ref 8.9–10.3)
Chloride: 108 mmol/L (ref 98–111)
Creatinine, Ser: 0.84 mg/dL (ref 0.61–1.24)
GFR, Estimated: >60 mL/min (ref >60)
Glucose, Bld: 107 mg/dL — ABNORMAL HIGH (ref 70–99)
Potassium: 3.7 mmol/L (ref 3.5–5.1)
Sodium: 141 mmol/L (ref 135–145)

## 2021-01-04 LAB — STREP PNEUMONIAE URINARY ANTIGEN: Strep Pneumo Urinary Antigen: NEGATIVE

## 2021-01-04 LAB — COMPREHENSIVE METABOLIC PANEL
ALT: 57 U/L — ABNORMAL HIGH (ref 0–44)
AST: 95 U/L — ABNORMAL HIGH (ref 15–41)
Albumin: 3.5 g/dL (ref 3.5–5.0)
Alkaline Phosphatase: 54 U/L (ref 38–126)
Anion gap: 8 (ref 5–15)
BUN: <5 mg/dL — ABNORMAL LOW (ref 6–20)
CO2: 26 mmol/L (ref 22–32)
Calcium: 8.5 mg/dL — ABNORMAL LOW (ref 8.9–10.3)
Chloride: 107 mmol/L (ref 98–111)
Creatinine, Ser: 0.82 mg/dL (ref 0.61–1.24)
GFR, Estimated: >60 mL/min (ref >60)
Glucose, Bld: 106 mg/dL — ABNORMAL HIGH (ref 70–99)
Potassium: 3.8 mmol/L (ref 3.5–5.1)
Sodium: 141 mmol/L (ref 135–145)
Total Bilirubin: 0.7 mg/dL (ref 0.3–1.2)
Total Protein: 6.7 g/dL (ref 6.5–8.1)

## 2021-01-04 LAB — BLOOD GAS, ARTERIAL
Acid-Base Excess: 2.7 mmol/L — ABNORMAL HIGH (ref 0.0–2.0)
Bicarbonate: 24.7 mmol/L (ref 20.0–28.0)
FIO2: 60
O2 Saturation: 100 %
Patient temperature: 99.7
pCO2 arterial: 30.8 mmHg — ABNORMAL LOW (ref 32.0–48.0)
pH, Arterial: 7.516 — ABNORMAL HIGH (ref 7.350–7.450)
pO2, Arterial: 231 mmHg — ABNORMAL HIGH (ref 83.0–108.0)

## 2021-01-04 LAB — CBC
HCT: 40.6 % (ref 39.0–52.0)
Hemoglobin: 14 g/dL (ref 13.0–17.0)
MCH: 29.9 pg (ref 26.0–34.0)
MCHC: 34.5 g/dL (ref 30.0–36.0)
MCV: 86.6 fL (ref 80.0–100.0)
Platelets: 128 10*3/uL — ABNORMAL LOW (ref 150–400)
RBC: 4.69 MIL/uL (ref 4.22–5.81)
RDW: 16.1 % — ABNORMAL HIGH (ref 11.5–15.5)
WBC: 3.2 10*3/uL — ABNORMAL LOW (ref 4.0–10.5)
nRBC: 0 % (ref 0.0–0.2)

## 2021-01-04 LAB — HEMOGLOBIN AND HEMATOCRIT, BLOOD
HCT: 46.1 % (ref 39.0–52.0)
Hemoglobin: 15.7 g/dL (ref 13.0–17.0)

## 2021-01-04 LAB — PHOSPHORUS: Phosphorus: 1.7 mg/dL — ABNORMAL LOW (ref 2.5–4.6)

## 2021-01-04 LAB — MAGNESIUM: Magnesium: 1.7 mg/dL (ref 1.7–2.4)

## 2021-01-04 LAB — TRIGLYCERIDES: Triglycerides: 151 mg/dL — ABNORMAL HIGH (ref <150)

## 2021-01-04 LAB — LACTIC ACID, PLASMA
Lactic Acid, Venous: 1.5 mmol/L (ref 0.5–1.9)
Lactic Acid, Venous: 1.7 mmol/L (ref 0.5–1.9)

## 2021-01-04 LAB — CK TOTAL AND CKMB (NOT AT ARMC)
CK, MB: 3.2 ng/mL (ref 0.5–5.0)
Relative Index: 0.5 (ref 0.0–2.5)
Total CK: 607 U/L — ABNORMAL HIGH (ref 49–397)

## 2021-01-04 LAB — PROCALCITONIN: Procalcitonin: <0.1 ng/mL

## 2021-01-04 LAB — GLUCOSE, CAPILLARY: Glucose-Capillary: 120 mg/dL — ABNORMAL HIGH (ref 70–99)

## 2021-01-04 MED ORDER — MAGNESIUM SULFATE IN D5W 1-5 GM/100ML-% IV SOLN
1.0000 g | Freq: Once | INTRAVENOUS | Status: AC
  Administered 2021-01-04: 1 g via INTRAVENOUS
  Filled 2021-01-04: qty 100

## 2021-01-04 MED ORDER — LABETALOL HCL 5 MG/ML IV SOLN
10.0000 mg | INTRAVENOUS | Status: DC | PRN
  Administered 2021-01-04 – 2021-01-05 (×3): 10 mg via INTRAVENOUS
  Filled 2021-01-04 (×5): qty 4

## 2021-01-04 MED ORDER — POTASSIUM PHOSPHATES 15 MMOLE/5ML IV SOLN
20.0000 mmol | Freq: Once | INTRAVENOUS | Status: AC
  Administered 2021-01-04: 20 mmol via INTRAVENOUS
  Filled 2021-01-04: qty 6.67

## 2021-01-04 MED ORDER — ACETAMINOPHEN 160 MG/5ML PO SOLN
650.0000 mg | Freq: Three times a day (TID) | ORAL | Status: DC | PRN
  Administered 2021-01-04 – 2021-01-05 (×2): 650 mg
  Filled 2021-01-04 (×2): qty 20.3

## 2021-01-04 NOTE — Progress Notes (Signed)
eLink Physician-Brief Progress Note Patient Name: Idris Edmundson DOB: [birth year] MRN: [id number]   Date of Service  01/04/2021  HPI/Events of Note  Notified of blood tinged output from feeding tube  eICU Interventions  Check H/H Already on pantoprazole        Shona Needles Fread Kottke 01/04/2021, 8:48 PM

## 2021-01-04 NOTE — Progress Notes (Signed)
Per orders, foley was removed at 1150 and external catheter was applied. Pt was unable to void, and bladder scan showed >871 mLs of urine in bladder. Dr Chase Caller notified by this RN and orders obtained to in-and-out cath pt. This RN and NT 2 were successful in draining 1100 mLs of clear, yellow urine from bladder. RN will continue to monitor pt for retention.

## 2021-01-04 NOTE — Progress Notes (Signed)
[place] Progress Note Patient Name: [name] DOB: 09/16/1980 MRN: [id number]   Date of Service  01/04/2021  HPI/Events of Note  Notified of ABG result 7.51/30/231 On PRVC 20/510 (8 cc/kg)/40% 5 PEEP Not breathing over set rate, MV 10 Hypertenvie SBP 160s  HR 120s Temp 100.5  eICU Interventions  Decrease TV to 6 cc/kg Labetelol and Tylenol prn ordered     Intervention Category Major Interventions: Respiratory failure - evaluation and management Intermediate Interventions: Hypertension - evaluation and management Minor Interventions: Routine modifications to care plan (e.g. PRN medications for pain, fever)  [name] 01/04/2021, 4:46 AM

## 2021-01-04 NOTE — Progress Notes (Signed)
[place] Progress Note Patient Name: Lucio Litsey DOB: 1980/12/18 MRN: [id number]   Date of Service  01/04/2021  HPI/Events of Note  Notified of Phos 1.7, Mg 1.7 and K 3.7  eICU Interventions  Kphos 20 mmol Magnesium 1 g     Intervention Category Intermediate Interventions: Electrolyte abnormality - evaluation and management  [name] 01/04/2021, 6:47 AM

## 2021-01-04 NOTE — Progress Notes (Signed)
Brief Nutrition Note RD working remotely.   Consult received for enteral/tube feeding initiation and management.  Adult Enteral Nutrition Protocol initiated. Full assessment to follow. OGT placed yesterday at 1218 for patient on the vent.   Admitting Dx: Acute respiratory failure ([place]) [J96.00]  Body mass index is 27.75 kg/m. Pt meets criteria for overweight status based on current BMI.  Labs:  Recent Labs  Lab 01/03/21 0903 01/03/21 1239 01/03/21 2228 01/04/21 0508  NA 143  --  142 141  141  K 2.8*  --  3.7 3.8  3.7  CL 107  --  110 107  108  CO2 26  --  23 26  26   BUN 7  --  5* <5*  <5*  CREATININE 0.70  --  0.73 0.82  0.84  CALCIUM 8.1*  --  7.8* 8.5*  8.5*  MG  --  1.7 1.6* 1.7  PHOS  --   --  2.2* 1.7*  GLUCOSE 98  --  94 106*  107*       Jarome Matin, MS, RD, LDN, CNSC Inpatient Clinical Dietitian RD pager # available in [facility]  After hours/weekend pager # available in [facility]
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
## 2021-01-05 ENCOUNTER — Encounter (HOSPITAL_COMMUNITY): Payer: Self-pay | Admitting: Internal Medicine

## 2021-01-05 ENCOUNTER — Inpatient Hospital Stay (HOSPITAL_COMMUNITY): Payer: Self-pay

## 2021-01-05 LAB — COMPREHENSIVE METABOLIC PANEL
ALT: 46 U/L — ABNORMAL HIGH (ref 0–44)
AST: 66 U/L — ABNORMAL HIGH (ref 15–41)
Albumin: 3.4 g/dL — ABNORMAL LOW (ref 3.5–5.0)
Alkaline Phosphatase: 66 U/L (ref 38–126)
Anion gap: 7 (ref 5–15)
BUN: <5 mg/dL — ABNORMAL LOW (ref 6–20)
CO2: 25 mmol/L (ref 22–32)
Calcium: 8.6 mg/dL — ABNORMAL LOW (ref 8.9–10.3)
Chloride: 107 mmol/L (ref 98–111)
Creatinine, Ser: 0.83 mg/dL (ref 0.61–1.24)
GFR, Estimated: >60 mL/min (ref >60)
Glucose, Bld: 104 mg/dL — ABNORMAL HIGH (ref 70–99)
Potassium: 3.7 mmol/L (ref 3.5–5.1)
Sodium: 139 mmol/L (ref 135–145)
Total Bilirubin: 0.7 mg/dL (ref 0.3–1.2)
Total Protein: 6.7 g/dL (ref 6.5–8.1)

## 2021-01-05 LAB — CBC
HCT: 43.2 % (ref 39.0–52.0)
Hemoglobin: 14.7 g/dL (ref 13.0–17.0)
MCH: 29.9 pg (ref 26.0–34.0)
MCHC: 34 g/dL (ref 30.0–36.0)
MCV: 88 fL (ref 80.0–100.0)
Platelets: 109 10*3/uL — ABNORMAL LOW (ref 150–400)
RBC: 4.91 MIL/uL (ref 4.22–5.81)
RDW: 15.9 % — ABNORMAL HIGH (ref 11.5–15.5)
WBC: 6.2 10*3/uL (ref 4.0–10.5)
nRBC: 0 % (ref 0.0–0.2)

## 2021-01-05 LAB — GLUCOSE, CAPILLARY
Glucose-Capillary: 94 mg/dL (ref 70–99)
Glucose-Capillary: 79 mg/dL (ref 70–99)
Glucose-Capillary: 133 mg/dL — ABNORMAL HIGH (ref 70–99)
Glucose-Capillary: 98 mg/dL (ref 70–99)
Glucose-Capillary: 121 mg/dL — ABNORMAL HIGH (ref 70–99)
Glucose-Capillary: 86 mg/dL (ref 70–99)

## 2021-01-05 LAB — LACTIC ACID, PLASMA: Lactic Acid, Venous: 2.4 mmol/L (ref 0.5–1.9)

## 2021-01-05 LAB — LEGIONELLA PNEUMOPHILA SEROGP 1 UR AG: L. pneumophila Serogp 1 Ur Ag: NEGATIVE

## 2021-01-05 LAB — PROCALCITONIN: Procalcitonin: <0.1 ng/mL

## 2021-01-05 LAB — MAGNESIUM: Magnesium: 1.4 mg/dL — ABNORMAL LOW (ref 1.7–2.4)

## 2021-01-05 LAB — CK TOTAL AND CKMB (NOT AT ARMC)
CK, MB: 1 ng/mL (ref 0.5–5.0)
Relative Index: 0.3 (ref 0.0–2.5)
Total CK: 365 U/L (ref 49–397)

## 2021-01-05 LAB — PHOSPHORUS: Phosphorus: 3.2 mg/dL (ref 2.5–4.6)

## 2021-01-05 MED ORDER — LORAZEPAM 2 MG/ML IJ SOLN
1.0000 mg | INTRAMUSCULAR | Status: DC | PRN
  Administered 2021-01-06: 2 mg via INTRAVENOUS
  Filled 2021-01-05: qty 1

## 2021-01-05 MED ORDER — BETHANECHOL CHLORIDE 10 MG PO TABS
5.0000 mg | ORAL_TABLET | Freq: Three times a day (TID) | ORAL | Status: DC
  Administered 2021-01-05 – 2021-01-07 (×7): 5 mg via ORAL
  Filled 2021-01-05 (×4): qty 0.5
  Filled 2021-01-05 (×2): qty 1
  Filled 2021-01-05: qty 0.5
  Filled 2021-01-05: qty 1
  Filled 2021-01-05: qty 0.5
  Filled 2021-01-05: qty 1
  Filled 2021-01-05: qty 0.5

## 2021-01-05 MED ORDER — DEXMEDETOMIDINE HCL IN NACL 200 MCG/50ML IV SOLN
0.4000 ug/kg/h | INTRAVENOUS | Status: DC
Start: 2021-01-05 — End: 2021-01-06
  Administered 2021-01-05 (×3): 0.4 ug/kg/h via INTRAVENOUS
  Filled 2021-01-05 (×3): qty 50

## 2021-01-05 MED ORDER — FOLIC ACID 1 MG PO TABS
1.0000 mg | ORAL_TABLET | Freq: Every day | ORAL | Status: DC
  Administered 2021-01-05 – 2021-01-07 (×3): 1 mg via ORAL
  Filled 2021-01-05 (×3): qty 1

## 2021-01-05 MED ORDER — THIAMINE HCL 100 MG PO TABS
100.0000 mg | ORAL_TABLET | Freq: Every day | ORAL | Status: DC
  Administered 2021-01-05 – 2021-01-07 (×3): 100 mg via ORAL
  Filled 2021-01-05 (×3): qty 1

## 2021-01-05 MED ORDER — LORAZEPAM 1 MG PO TABS
1.0000 mg | ORAL_TABLET | ORAL | Status: DC | PRN
  Administered 2021-01-06: 2 mg via ORAL
  Administered 2021-01-07: 3 mg via ORAL
  Administered 2021-01-07: 2 mg via ORAL
  Filled 2021-01-05: qty 3
  Filled 2021-01-05 (×2): qty 2

## 2021-01-05 MED ORDER — ADULT MULTIVITAMIN W/MINERALS CH
1.0000 | ORAL_TABLET | Freq: Every day | ORAL | Status: DC
  Administered 2021-01-05 – 2021-01-07 (×3): 1 via ORAL
  Filled 2021-01-05 (×3): qty 1

## 2021-01-05 MED ORDER — DIPHENHYDRAMINE HCL 50 MG/ML IJ SOLN
25.0000 mg | Freq: Once | INTRAMUSCULAR | Status: AC
  Administered 2021-01-05: 25 mg via INTRAVENOUS
  Filled 2021-01-05: qty 1

## 2021-01-05 NOTE — Plan of Care (Signed)
Patient extubated to [facility] this AM. Patient is febrile with elevated LA: urine and respiratory cultures pending. 1:1 sitter at bedside as patient initially presented with a drug overdose. Plan: BSE later today, D/C foley later this AM, wean precedex as tolerated.    Problem: Education: Goal: Knowledge of General Education information will improve Description: Including pain rating scale, medication(s)/side effects and non-pharmacologic comfort measures Outcome: Progressing   Problem: Health Behavior/Discharge Planning: Goal: Ability to manage health-related needs will improve Outcome: Progressing   Problem: Clinical Measurements: Goal: Ability to maintain clinical measurements within normal limits will improve Outcome: Progressing Goal: Will remain free from infection Outcome: Progressing Goal: Diagnostic test results will improve Outcome: Progressing Goal: Respiratory complications will improve Outcome: Progressing Goal: Cardiovascular complication will be avoided Outcome: Progressing   Problem: Activity: Goal: Risk for activity intolerance will decrease Outcome: Progressing   Problem: Nutrition: Goal: Adequate nutrition will be maintained Outcome: Progressing   Problem: Coping: Goal: Level of anxiety will decrease Outcome: Progressing   Problem: Elimination: Goal: Will not experience complications related to bowel motility Outcome: Progressing Goal: Will not experience complications related to urinary retention Outcome: Progressing   Problem: Pain Managment: Goal: General experience of comfort will improve Outcome: Progressing   Problem: Safety: Goal: Ability to remain free from injury will improve Outcome: Progressing   Problem: Skin Integrity: Goal: Risk for impaired skin integrity will decrease Outcome: Progressing
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
## 2021-01-05 NOTE — Procedures (Signed)
Extubation Procedure Note  Patient Details:   Name: [name] DOB: Aug 25, 1980 MRN: [id number]   Airway Documentation:    Vent end date: 01/05/21 Vent end time: 0854   Evaluation  O2 sats: stable throughout Complications: No apparent complications Patient did tolerate procedure well. Bilateral Breath Sounds: Clear   Yes  Johnette Abraham 01/05/2021, 8:54 AM

## 2021-01-05 NOTE — Progress Notes (Signed)
PCCM progress note  Patient successfully extubated this a.m.   Postextubation discussion held with patient regarding cause to hospitalization.  Patient does endorse intention to commit suicide by overdose.  Reports he took unknown amount of trazodone and Seroquel in addition to heavy EtOH use in an attempt to commit suicide.  Currently denies any intention to harm self  He reports long history of alcohol abuse with upwards of 2 bottles of wine daily.  He reports prior alcohol withdrawal and alcohol withdrawal seizures.  At this time we will place a psychiatry consult and provide one-to-one sitter.  We will also monitor patient closely for withdrawal symptoms and continue low-dose Precedex drip.  [name] Cancel, NP-C [place] [facility] Personal contact information can be found on Amion  01/05/2021, 12:16 PM

## 2021-01-05 NOTE — Progress Notes (Signed)
eLink Physician-Brief Progress Note Patient Name: [name] DOB: 09-10-1980 MRN: [id number]   Date of Service  01/05/2021  HPI/Events of Note  Notified of lactate 2.4 Not on pressors Making urine. Requiring in and out cath x 2  eICU Interventions  Foley ordered     Intervention Category Intermediate Interventions: Other:  [name] 01/05/2021, 4:52 AM

## 2021-01-05 NOTE — Progress Notes (Signed)
Nutrition Brief Note  Consult received for TF initiation and management. TF protocol started 6/19.   Wt Readings from Last 15 Encounters:  01/05/21 74.4 kg  11/04/20 75.8 kg  11/01/20 74.8 kg  10/15/20 71.6 kg  10/10/20 71.7 kg  09/15/20 73.5 kg  08/14/20 75.4 kg  07/03/20 72.3 kg  06/30/20 72.4 kg  06/17/20 71.7 kg  06/05/20 71.8 kg  03/25/20 73.9 kg  11/26/19 73.8 kg  10/30/19 75.2 kg  04/22/19 74 kg    Body mass index is 26.47 kg/m. Patient meets criteria for overweight status based on current BMI. Skin WDL.   Patient was intubated with OGT in place. He was extubated to [place] and OGT removed today at ~0855. Diet advanced from NPO to FLD at 1210.  Labs and medications reviewed.   Patient did not screen for any other nutrition-related needs beyond vent and TF. RD will not follow at this time. No nutrition interventions warranted at this time. If nutrition issues arise, please re-consult RD.       Jarome Matin, MS, RD, LDN, CNSC Inpatient Clinical Dietitian RD pager # available in [name]  After hours/weekend pager # available in [facility]

## 2021-01-05 NOTE — Progress Notes (Signed)
Patient bladder scanned due to no output in 6 hours. Bladder scanner read >999 amount emptied from bladder during I&O was 543m. Was told by dayshift nurse that she met a little resistance when doing last I&O. I myself felt resistance as well and the catheter almost "popped" past something before I got into the bladder. Patient is continent of urine and won't release his bladder on his own. I placed a primafit this time in place of the condom cath so hopefully that will encourage him to use bathroom. Will ask provider for order for foley if I have to I&O again as I am concerned with potentially making the resistance worse with continued agitation.

## 2021-01-05 NOTE — Progress Notes (Signed)
Date and time results received: 01/05/21  @ 0437   Test: Lactic Acid Critical Value: 2.4  Name of Provider Notified: E Link  Orders Received? Or Actions Taken?: Awaiting call back
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
## 2021-01-06 ENCOUNTER — Encounter (HOSPITAL_COMMUNITY): Payer: Self-pay | Admitting: Internal Medicine

## 2021-01-06 LAB — BASIC METABOLIC PANEL
Anion gap: 7 (ref 5–15)
BUN: 7 mg/dL (ref 6–20)
CO2: 24 mmol/L (ref 22–32)
Calcium: 8.7 mg/dL — ABNORMAL LOW (ref 8.9–10.3)
Chloride: 103 mmol/L (ref 98–111)
Creatinine, Ser: 0.81 mg/dL (ref 0.61–1.24)
GFR, Estimated: >60 mL/min (ref >60)
Glucose, Bld: 89 mg/dL (ref 70–99)
Potassium: 4 mmol/L (ref 3.5–5.1)
Sodium: 134 mmol/L — ABNORMAL LOW (ref 135–145)

## 2021-01-06 LAB — URINE CULTURE: Culture: NO GROWTH

## 2021-01-06 LAB — HIV ANTIBODY (ROUTINE TESTING W REFLEX): HIV Screen 4th Generation wRfx: NONREACTIVE

## 2021-01-06 LAB — CBC
HCT: 37.3 % — ABNORMAL LOW (ref 39.0–52.0)
Hemoglobin: 12.6 g/dL — ABNORMAL LOW (ref 13.0–17.0)
MCH: 29.8 pg (ref 26.0–34.0)
MCHC: 33.8 g/dL (ref 30.0–36.0)
MCV: 88.2 fL (ref 80.0–100.0)
Platelets: 86 10*3/uL — ABNORMAL LOW (ref 150–400)
RBC: 4.23 MIL/uL (ref 4.22–5.81)
RDW: 15.5 % (ref 11.5–15.5)
WBC: 10.4 10*3/uL (ref 4.0–10.5)
nRBC: 0 % (ref 0.0–0.2)

## 2021-01-06 LAB — GLUCOSE, CAPILLARY
Glucose-Capillary: 91 mg/dL (ref 70–99)
Glucose-Capillary: 90 mg/dL (ref 70–99)
Glucose-Capillary: 100 mg/dL — ABNORMAL HIGH (ref 70–99)
Glucose-Capillary: 92 mg/dL (ref 70–99)

## 2021-01-06 MED ORDER — PANTOPRAZOLE SODIUM 40 MG PO TBEC
40.0000 mg | DELAYED_RELEASE_TABLET | Freq: Every day | ORAL | Status: DC
  Administered 2021-01-06: 40 mg via ORAL
  Filled 2021-01-06: qty 1

## 2021-01-06 MED ORDER — ENOXAPARIN SODIUM 40 MG/0.4ML IJ SOSY
40.0000 mg | PREFILLED_SYRINGE | Freq: Every day | INTRAMUSCULAR | Status: DC
  Administered 2021-01-07: 40 mg via SUBCUTANEOUS
  Filled 2021-01-06: qty 0.4

## 2021-01-06 MED ORDER — BUSPIRONE HCL 5 MG PO TABS
10.0000 mg | ORAL_TABLET | Freq: Three times a day (TID) | ORAL | Status: DC
  Administered 2021-01-06 – 2021-01-07 (×4): 10 mg via ORAL
  Filled 2021-01-06 (×2): qty 2
  Filled 2021-01-06: qty 1
  Filled 2021-01-06: qty 2

## 2021-01-06 MED ORDER — LAMOTRIGINE 25 MG PO TABS
25.0000 mg | ORAL_TABLET | Freq: Two times a day (BID) | ORAL | Status: DC
  Administered 2021-01-06 – 2021-01-07 (×3): 25 mg via ORAL
  Filled 2021-01-06 (×3): qty 1

## 2021-01-06 MED ORDER — QUETIAPINE FUMARATE 200 MG PO TABS
300.0000 mg | ORAL_TABLET | Freq: Every day | ORAL | Status: DC
  Administered 2021-01-06: 300 mg via ORAL
  Filled 2021-01-06: qty 1

## 2021-01-06 MED ORDER — AMLODIPINE BESYLATE 10 MG PO TABS
10.0000 mg | ORAL_TABLET | Freq: Every day | ORAL | Status: DC
  Administered 2021-01-06 – 2021-01-07 (×2): 10 mg via ORAL
  Filled 2021-01-06 (×2): qty 1

## 2021-01-06 MED ORDER — GABAPENTIN 300 MG PO CAPS
300.0000 mg | ORAL_CAPSULE | Freq: Three times a day (TID) | ORAL | Status: DC
  Administered 2021-01-06 – 2021-01-07 (×4): 300 mg via ORAL
  Filled 2021-01-06 (×4): qty 1
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
## 2021-01-06 NOTE — Progress Notes (Addendum)
PROGRESS NOTE    [name]  HRC:[id number] DOB: 02-17-1981 DOA: 01/03/2021 PCP: Maudie Mercury, MD     Brief Narrative:   Hx bipolar disorder, suicide attempts, and polysubstance abuse, admitted 6/18 after intentional overdose of seroquel, trazodone, and alcohol. Last drink was 6/18. UDS positive for cocaine and benzodiazepines. Prolonged qtc that has resolved. Required intubation and mechanical ventilation for airway protection. Now extubated and breathing comfortable. Off precedex gtt for alcohol withdrawal.   Assessment & Plan:   Active Problems:   Acute respiratory failure (HCC)   # Acute respiratory failure 2/2 toxic ingestion Treated with mechanical ventilation, now extubated, breathing comfortably  # Suicide attempt - sitter at bedside - psychiatry has been consulted  # Alcohol withdrawal Medically stable to transfer to inpatient psych when bed available - continue ciwa - continue thiamine and folate - transfer to floor - seizure precautions  # Alcohol liver diseaes # Thrombocytopenia - abstinence from alcohol necessary  # Bipolar disorder - home meds on hold, will defer resumption/changes to psychiatry  # HTN - resume home amlodipine  # Polysubstance abuse - hiv neg, will check hep b and hep c and rpr (last checked in 2019)   DVT prophylaxis: lovenox Code Status: full Family Communication: none @ bedside  Level of care: ICU Status is: Inpatient  Remains inpatient appropriate because:Inpatient level of care appropriate due to severity of illness  Dispo: The patient is from: Home              Anticipated d/c is to:  tbd              Patient currently is not medically stable to d/c.   Difficult to place patient No        Consultants:  psychiatry  Procedures: intubation  Antimicrobials:  none    Subjective: This morning feeling much improved, has a bit of appetite, slightly tremulous  Objective: Vitals:   01/06/21 0400 01/06/21  0456 01/06/21 0730 01/06/21 0800  BP:    (!) 148/107  Pulse:    (!) 106  Resp:    19  Temp: 98.6 F (37 C)  99 F (37.2 C)   TempSrc: Oral  Oral   SpO2:    95%  Weight:  72.9 kg    Height:        Intake/Output Summary (Last 24 hours) at 01/06/2021 1026 Last data filed at 01/06/2021 0800 Gross per 24 hour  Intake 174.22 ml  Output 960 ml  Net -785.78 ml   Filed Weights   01/04/21 0500 01/05/21 0500 01/06/21 0456  Weight: 78 kg 74.4 kg 72.9 kg    Examination:  General exam: Appears calm and comfortable  Respiratory system: faint rales at bases Cardiovascular system: S1 & S2 heard, RRR. No JVD, murmurs, rubs, gallops or clicks. No pedal edema. Gastrointestinal system: Abdomen is nondistended, soft and nontender. No organomegaly or masses felt. Normal bowel sounds heard. Central nervous system: Alert and oriented. No focal neurological deficits. Extremities: Symmetric 5 x 5 power. Skin: No rashes, lesions or ulcers Psychiatry: Judgement and insight appear normal. Mood & affect appropriate.     Data Reviewed: I have personally reviewed following labs and imaging studies  CBC: Recent Labs  Lab 01/03/21 0903 01/04/21 0508 01/04/21 2102 01/05/21 0327 01/06/21 0233  WBC 3.5* 3.2*  --  6.2 10.4  NEUTROABS 1.0*  --   --   --   --   HGB 13.3 14.0 15.7 14.7 12.6*  HCT 39.2 40.6  46.1 43.2 37.3*  MCV 86.5 86.6  --  88.0 88.2  PLT 127* 128*  --  109* 86*   Basic Metabolic Panel: Recent Labs  Lab 01/03/21 0903 01/03/21 1239 01/03/21 2228 01/04/21 0508 01/05/21 0327 01/05/21 1924 01/06/21 0233  NA 143  --  142 141  141 139  --  134*  K 2.8*  --  3.7 3.8  3.7 3.7  --  4.0  CL 107  --  110 107  108 107  --  103  CO2 26  --  23 26  26 25   --  24  GLUCOSE 98  --  94 106*  107* 104*  --  89  BUN 7  --  5* <5*  <5* <5*  --  7  CREATININE 0.70  --  0.73 0.82  0.84 0.83  --  0.81  CALCIUM 8.1*  --  7.8* 8.5*  8.5* 8.6*  --  8.7*  MG  --  1.7 1.6* 1.7  --  1.4*  --    PHOS  --   --  2.2* 1.7*  --  3.2  --    GFR: Estimated Creatinine Clearance: 108.3 mL/min (by C-G formula based on SCr of 0.81 mg/dL). Liver Function Tests: Recent Labs  Lab 01/03/21 0903 01/04/21 0508 01/05/21 0327  AST 110* 95* 66*  ALT 53* 57* 46*  ALKPHOS 52 54 66  BILITOT 0.4 0.7 0.7  PROT 6.5 6.7 6.7  ALBUMIN 3.6 3.5 3.4*   No results for input(s): LIPASE, AMYLASE in the last 168 hours. Recent Labs  Lab 01/03/21 0930  AMMONIA 16   Coagulation Profile: No results for input(s): INR, PROTIME in the last 168 hours. Cardiac Enzymes: Recent Labs  Lab 01/04/21 0508 01/05/21 0327  CKTOTAL 607* 365  CKMB 3.2 1.0   BNP (last 3 results) No results for input(s): PROBNP in the last 8760 hours. HbA1C: No results for input(s): HGBA1C in the last 72 hours. CBG: Recent Labs  Lab 01/05/21 1547 01/05/21 1907 01/05/21 2307 01/06/21 0356 01/06/21 0714  GLUCAP 98 121* 86 91 90   Lipid Profile: Recent Labs    01/04/21 0508  TRIG 151*   Thyroid Function Tests: No results for input(s): TSH, T4TOTAL, FREET4, T3FREE, THYROIDAB in the last 72 hours. Anemia Panel: No results for input(s): VITAMINB12, FOLATE, FERRITIN, TIBC, IRON, RETICCTPCT in the last 72 hours. Urine analysis:    Component Value Date/Time   COLORURINE STRAW (A) 01/03/2021 1330   APPEARANCEUR CLEAR 01/03/2021 1330   LABSPEC 1.009 01/03/2021 1330   PHURINE 6.0 01/03/2021 1330   GLUCOSEU NEGATIVE 01/03/2021 1330   HGBUR NEGATIVE 01/03/2021 1330   HGBUR negative 12/22/2007 0831   BILIRUBINUR NEGATIVE 01/03/2021 1330   KETONESUR 5 (A) 01/03/2021 1330   PROTEINUR NEGATIVE 01/03/2021 1330   UROBILINOGEN 1.0 06/02/2015 2032   NITRITE NEGATIVE 01/03/2021 1330   LEUKOCYTESUR NEGATIVE 01/03/2021 1330   Sepsis Labs: @LABRCNTIP (procalcitonin:4,lacticidven:4)  ) Recent Results (from the past 240 hour(s))  Resp Panel by RT-PCR (Flu A&B, Covid) Urine, Clean Catch     Status: None   Collection Time:  01/03/21  9:30 AM   Specimen: Urine, Clean Catch; Nasopharyngeal(NP) swabs in vial transport medium  Result Value Ref Range Status   SARS Coronavirus 2 by RT PCR NEGATIVE NEGATIVE Final    Comment: (NOTE) SARS-CoV-2 target nucleic acids are NOT DETECTED.  The SARS-CoV-2 RNA is generally detectable in upper respiratory specimens during the acute phase of infection. The lowest concentration  of SARS-CoV-2 viral copies this assay can detect is 138 copies/mL. A negative result does not preclude SARS-Cov-2 infection and should not be used as the sole basis for treatment or other patient management decisions. A negative result may occur with  improper specimen collection/handling, submission of specimen other than nasopharyngeal swab, presence of viral mutation(s) within the areas targeted by this assay, and inadequate number of viral copies(<138 copies/mL). A negative result must be combined with clinical observations, patient history, and epidemiological information. The expected result is Negative.  Fact Sheet for Patients:  EntrepreneurPulse.com.au  Fact Sheet for Healthcare Providers:  IncredibleEmployment.be  This test is no t yet approved or cleared by the [place] FDA and  has been authorized for detection and/or diagnosis of SARS-CoV-2 by FDA under an Emergency Use Authorization (EUA). This EUA will remain  in effect (meaning this test can be used) for the duration of the COVID-19 declaration under Section 564(b)(1) of the Act, 21 U.S.C.section 360bbb-3(b)(1), unless the authorization is terminated  or revoked sooner.       Influenza A by PCR NEGATIVE NEGATIVE Final   Influenza B by PCR NEGATIVE NEGATIVE Final    Comment: (NOTE) The Xpert Xpress SARS-CoV-2/FLU/RSV plus assay is intended as an aid in the diagnosis of influenza from Nasopharyngeal swab specimens and should not be used as a sole basis for treatment. Nasal washings  and aspirates are unacceptable for Xpert Xpress SARS-CoV-2/FLU/RSV testing.  Fact Sheet for Patients: EntrepreneurPulse.com.au  Fact Sheet for Healthcare Providers: IncredibleEmployment.be  This test is not yet approved or cleared by the [place] FDA and has been authorized for detection and/or diagnosis of SARS-CoV-2 by FDA under an Emergency Use Authorization (EUA). This EUA will remain in effect (meaning this test can be used) for the duration of the COVID-19 declaration under Section 564(b)(1) of the Act, 21 U.S.C. section 360bbb-3(b)(1), unless the authorization is terminated or revoked.  Performed at [facility], [place] [address], [place], [place] [address]   Blood culture (routine x 2)     Status: None (Preliminary result)   Collection Time: 01/03/21 10:14 AM   Specimen: BLOOD  Result Value Ref Range Status   Specimen Description   Final    BLOOD BLOOD RIGHT FOREARM Performed at [facility] [place] [address], [place], [place] [address]    Special Requests   Final    BOTTLES DRAWN AEROBIC AND ANAEROBIC Blood Culture adequate volume Performed at [place] [address], [place], [place] [address]    Culture   Final    NO GROWTH 3 DAYS Performed at [facility], [place] [address], [place], [place] [address]    Report Status PENDING  Incomplete  Blood culture (routine x 2)     Status: None (Preliminary result)   Collection Time: 01/03/21 10:36 AM   Specimen: BLOOD  Result Value Ref Range Status   Specimen Description   Final    BLOOD BLOOD RIGHT HAND Performed at [facility] [address] [place], [place] [address]    Special Requests   Final    BOTTLES DRAWN AEROBIC AND ANAEROBIC Blood Culture results may not be optimal due to an excessive volume of blood received in culture bottles Performed at [facility]  [address] [place], [place] [address]    Culture   Final    NO GROWTH 3 DAYS Performed at [facility], [place] [address], [place], [place]  [address]    Report Status PENDING  Incomplete  MRSA PCR Screening     Status: None   Collection Time: 01/03/21 11:00 AM  Result Value Ref Range Status   MRSA by PCR NEGATIVE NEGATIVE Final    Comment:        The GeneXpert MRSA Assay (FDA approved for NASAL specimens only), is one component of a comprehensive MRSA colonization surveillance program. It is not intended to diagnose MRSA infection nor to guide or monitor treatment for MRSA infections. Performed at [facility], [place] [address] [place], [place] [address]   Culture, Respiratory w Gram Stain     Status: None (Preliminary result)   Collection Time: 01/05/21  8:29 AM   Specimen: Tracheal Aspirate; Respiratory  Result Value Ref Range Status   Specimen Description   Final    TRACHEAL ASPIRATE Performed at [facility] [address], [place], [place] [address]    Special Requests   Final    NONE Performed at [facility], [place] [address], [place], [place] [address]    Gram Stain   Final    MODERATE WBC PRESENT, PREDOMINANTLY PMN FEW GRAM NEGATIVE RODS RARE GRAM POSITIVE COCCI RARE GRAM POSITIVE RODS    Culture   Final    NO GROWTH 1 DAY Performed at [facility], [place] [address] [place], [place] [address]    Report Status PENDING  Incomplete         Radiology Studies: DG Chest 1 View  Result Date: 01/04/2021 CLINICAL DATA:  ET tube repositioned. EXAM: CHEST  1 VIEW COMPARISON:  Prior chest radiographs performed 01/04/2021 and earlier. FINDINGS: The ET tube now terminates 2 cm above the level of the carina. An enteric tube passes below the level of the left hemidiaphragm with tip projecting in the region of the stomach. Mild persistent bibasilar atelectasis. No appreciable airspace consolidation or  pulmonary edema. No evidence of pleural effusion or pneumothorax. Prior left clavicular ORIF. IMPRESSION: The ET tube now terminates 2 cm above the level of the carina. The enteric tube passes below the level of the left hemidiaphragm with tip projecting in the expected location of the stomach. Mild persistent bibasilar atelectasis. Electronically Signed   By: Kellie Simmering DO   On: 01/04/2021 11:23   DG CHEST PORT 1 VIEW  Result Date: 01/05/2021 CLINICAL DATA:  Acute respiratory failure, intubation EXAM: PORTABLE CHEST 1 VIEW COMPARISON:  Portable exam 0431 hours compared to 01/04/2021 FINDINGS: Tip of endotracheal tube projects 3.3 cm above carina. Nasogastric tube extends into stomach. Normal heart size, mediastinal contours, and pulmonary vascularity. RIGHT basilar atelectasis. Remaining lungs clear. No pleural effusion or pneumothorax. IMPRESSION: RIGHT basilar atelectasis. Electronically Signed   By: Lavonia Dana M.D.   On: 01/05/2021 08:01        Scheduled Meds:  amLODipine  10 mg Oral Daily   bethanechol  5 mg Oral TID   Chlorhexidine Gluconate Cloth  6 each Topical Q0600   docusate  100 mg Per Tube BID   [START ON 01/07/2021] enoxaparin (LOVENOX) injection  40 mg Subcutaneous V76H   folic acid  1 mg Oral Daily   multivitamin with minerals  1 tablet Oral Daily   pantoprazole  40 mg Oral Daily   polyethylene glycol  17 g Per Tube Daily   thiamine  100 mg Oral Daily   Continuous Infusions:  sodium chloride Stopped (01/05/21 1800)     LOS: 3 days  Time spent: 30 min    Desma Maxim, MD [facility]   If 7PM-7AM, please contact night-coverage [email or web address] Password [facility] 01/06/2021, 10:26 AM

## 2021-01-06 NOTE — TOC Progression Note (Signed)
Transition of Care [facility]) - Progression Note    Patient Details  Name: [name] MRN: [id number] Date of Birth: 1980-11-11  Transition of Care [facility] CM/SW Contact  Leeroy Cha, RN Phone Number: 01/06/2021, 7:56 AM  Clinical Narrative:    Temp this am is 102.4, wbc wnl, on room air, extubated on 062022 am.   Expected Discharge Plan: Home/Self Care Barriers to Discharge: Continued Medical Work up  Expected Discharge Plan and Services Expected Discharge Plan: Home/Self Care   Discharge Planning Services: CM Consult   Living arrangements for the past 2 months: Single Family Home                                       Social Determinants of Health (SDOH) Interventions    Readmission Risk Interventions No flowsheet data found.

## 2021-01-07 ENCOUNTER — Other Ambulatory Visit: Payer: Self-pay

## 2021-01-07 ENCOUNTER — Inpatient Hospital Stay (HOSPITAL_COMMUNITY)
Admission: AD | Admit: 2021-01-07 | Discharge: 2021-01-12 | DRG: 885 | Disposition: A | Discharge status: Home / Self Care | Payer: No Typology Code available for payment source | Source: Intra-hospital | Attending: Emergency Medicine | Admitting: Emergency Medicine

## 2021-01-07 ENCOUNTER — Encounter (HOSPITAL_COMMUNITY): Payer: Self-pay | Admitting: Emergency Medicine

## 2021-01-07 DIAGNOSIS — F17210 Nicotine dependence, cigarettes, uncomplicated: Secondary | ICD-10-CM | POA: Diagnosis present

## 2021-01-07 DIAGNOSIS — F1090 Alcohol use, unspecified, uncomplicated: Secondary | ICD-10-CM

## 2021-01-07 DIAGNOSIS — F1010 Alcohol abuse, uncomplicated: Secondary | ICD-10-CM | POA: Diagnosis present

## 2021-01-07 DIAGNOSIS — Z880 Allergy status to penicillin: Secondary | ICD-10-CM

## 2021-01-07 DIAGNOSIS — G40909 Epilepsy, unspecified, not intractable, without status epilepticus: Secondary | ICD-10-CM

## 2021-01-07 DIAGNOSIS — T43212A Poisoning by selective serotonin and norepinephrine reuptake inhibitors, intentional self-harm, initial encounter: Secondary | ICD-10-CM | POA: Diagnosis present

## 2021-01-07 DIAGNOSIS — E871 Hypo-osmolality and hyponatremia: Secondary | ICD-10-CM | POA: Diagnosis present

## 2021-01-07 DIAGNOSIS — T43592A Poisoning by other antipsychotics and neuroleptics, intentional self-harm, initial encounter: Secondary | ICD-10-CM | POA: Diagnosis present

## 2021-01-07 DIAGNOSIS — F315 Bipolar disorder, current episode depressed, severe, with psychotic features: Principal | ICD-10-CM | POA: Diagnosis present

## 2021-01-07 DIAGNOSIS — I10 Essential (primary) hypertension: Secondary | ICD-10-CM | POA: Diagnosis present

## 2021-01-07 DIAGNOSIS — D696 Thrombocytopenia, unspecified: Secondary | ICD-10-CM | POA: Diagnosis present

## 2021-01-07 DIAGNOSIS — E7800 Pure hypercholesterolemia, unspecified: Secondary | ICD-10-CM | POA: Diagnosis present

## 2021-01-07 DIAGNOSIS — Z20822 Contact with and (suspected) exposure to covid-19: Secondary | ICD-10-CM | POA: Diagnosis present

## 2021-01-07 DIAGNOSIS — F332 Major depressive disorder, recurrent severe without psychotic features: Secondary | ICD-10-CM | POA: Diagnosis present

## 2021-01-07 DIAGNOSIS — F14288 Cocaine dependence with other cocaine-induced disorder: Secondary | ICD-10-CM | POA: Diagnosis present

## 2021-01-07 DIAGNOSIS — Z5900 Homelessness unspecified: Secondary | ICD-10-CM

## 2021-01-07 DIAGNOSIS — F1410 Cocaine abuse, uncomplicated: Secondary | ICD-10-CM | POA: Diagnosis present

## 2021-01-07 DIAGNOSIS — Y908 Blood alcohol level of 240 mg/100 ml or more: Secondary | ICD-10-CM | POA: Diagnosis present

## 2021-01-07 DIAGNOSIS — F333 Major depressive disorder, recurrent, severe with psychotic symptoms: Secondary | ICD-10-CM

## 2021-01-07 DIAGNOSIS — F3175 Bipolar disorder, in partial remission, most recent episode depressed: Secondary | ICD-10-CM

## 2021-01-07 LAB — HEPATITIS C ANTIBODY: HCV Ab: NONREACTIVE

## 2021-01-07 LAB — BASIC METABOLIC PANEL
Anion gap: 7 (ref 5–15)
BUN: 8 mg/dL (ref 6–20)
CO2: 22 mmol/L (ref 22–32)
Calcium: 8.9 mg/dL (ref 8.9–10.3)
Chloride: 105 mmol/L (ref 98–111)
Creatinine, Ser: 0.79 mg/dL (ref 0.61–1.24)
GFR, Estimated: >60 mL/min (ref >60)
Glucose, Bld: 125 mg/dL — ABNORMAL HIGH (ref 70–99)
Potassium: 3.7 mmol/L (ref 3.5–5.1)
Sodium: 134 mmol/L — ABNORMAL LOW (ref 135–145)

## 2021-01-07 LAB — CULTURE, RESPIRATORY W GRAM STAIN: Culture: NORMAL

## 2021-01-07 LAB — HEPATITIS B SURFACE ANTIGEN: Hepatitis B Surface Ag: NONREACTIVE

## 2021-01-07 LAB — GLUCOSE, CAPILLARY
Glucose-Capillary: 116 mg/dL — ABNORMAL HIGH (ref 70–99)
Glucose-Capillary: 132 mg/dL — ABNORMAL HIGH (ref 70–99)
Glucose-Capillary: 104 mg/dL — ABNORMAL HIGH (ref 70–99)
Glucose-Capillary: 101 mg/dL — ABNORMAL HIGH (ref 70–99)
Glucose-Capillary: 110 mg/dL — ABNORMAL HIGH (ref 70–99)

## 2021-01-07 LAB — RESP PANEL BY RT-PCR (FLU A&B, COVID) ARPGX2
Influenza A by PCR: NEGATIVE
Influenza B by PCR: NEGATIVE
SARS Coronavirus 2 by RT PCR: NEGATIVE

## 2021-01-07 LAB — RPR: RPR Ser Ql: NONREACTIVE

## 2021-01-07 MED ORDER — LOSARTAN POTASSIUM 25 MG PO TABS: 25.0000 mg | ORAL_TABLET | Freq: Every day | ORAL | 11 refills | Status: DC

## 2021-01-07 MED ORDER — ALUM & MAG HYDROXIDE-SIMETH 200-200-20 MG/5ML PO SUSP
30.0000 mL | ORAL | Status: DC | PRN
  Administered 2021-01-10: 30 mL via ORAL
  Filled 2021-01-07 (×3): qty 30

## 2021-01-07 MED ORDER — GABAPENTIN 300 MG PO CAPS
300.0000 mg | ORAL_CAPSULE | Freq: Three times a day (TID) | ORAL | Status: DC
  Administered 2021-01-07 – 2021-01-12 (×15): 300 mg via ORAL
  Filled 2021-01-07 (×9): qty 1
  Filled 2021-01-07: qty 21
  Filled 2021-01-07 (×4): qty 1
  Filled 2021-01-07: qty 21
  Filled 2021-01-07: qty 1
  Filled 2021-01-07: qty 21
  Filled 2021-01-07 (×4): qty 1

## 2021-01-07 MED ORDER — LORAZEPAM 1 MG PO TABS
1.0000 mg | ORAL_TABLET | ORAL | Status: DC | PRN
Start: 2021-01-07 — End: 2021-01-08
  Administered 2021-01-08: 1 mg via ORAL
  Filled 2021-01-07: qty 1

## 2021-01-07 MED ORDER — PHENOL 1.4 % MT LIQD
1.0000 | OROMUCOSAL | Status: DC | PRN
  Filled 2021-01-07: qty 177

## 2021-01-07 MED ORDER — THIAMINE HCL 100 MG PO TABS
100.0000 mg | ORAL_TABLET | Freq: Every day | ORAL | Status: DC
  Administered 2021-01-08 – 2021-01-12 (×5): 100 mg via ORAL
  Filled 2021-01-07 (×7): qty 1

## 2021-01-07 MED ORDER — MAGNESIUM SULFATE 2 GM/50ML IV SOLN
2.0000 g | Freq: Once | INTRAVENOUS | Status: AC
  Administered 2021-01-07: 2 g via INTRAVENOUS
  Filled 2021-01-07: qty 50

## 2021-01-07 MED ORDER — THIAMINE HCL 100 MG PO TABS: 100.0000 mg | ORAL_TABLET | Freq: Every day | ORAL | Status: DC

## 2021-01-07 MED ORDER — PHENOL 1.4 % MT LIQD: 1.0000 | OROMUCOSAL | Status: DC | PRN

## 2021-01-07 MED ORDER — LORAZEPAM 2 MG/ML IJ SOLN
1.0000 mg | INTRAMUSCULAR | Status: DC | PRN
Start: 2021-01-07 — End: 2021-01-08

## 2021-01-07 MED ORDER — LAMOTRIGINE 25 MG PO TABS: 25.0000 mg | ORAL_TABLET | Freq: Two times a day (BID) | ORAL | Status: DC

## 2021-01-07 MED ORDER — QUETIAPINE FUMARATE 300 MG PO TABS
300.0000 mg | ORAL_TABLET | Freq: Every day | ORAL | Status: DC
  Administered 2021-01-07: 300 mg via ORAL
  Filled 2021-01-07 (×4): qty 1

## 2021-01-07 MED ORDER — MAGNESIUM HYDROXIDE 400 MG/5ML PO SUSP: 30.0000 mL | Freq: Every day | ORAL | Status: DC | PRN

## 2021-01-07 MED ORDER — ADULT MULTIVITAMIN W/MINERALS CH
1.0000 | ORAL_TABLET | Freq: Every day | ORAL | Status: DC
  Administered 2021-01-08 – 2021-01-12 (×5): 1 via ORAL
  Filled 2021-01-07 (×7): qty 1

## 2021-01-07 MED ORDER — BUSPIRONE HCL 10 MG PO TABS
10.0000 mg | ORAL_TABLET | Freq: Three times a day (TID) | ORAL | Status: DC
  Administered 2021-01-07 – 2021-01-12 (×15): 10 mg via ORAL
  Filled 2021-01-07 (×2): qty 1
  Filled 2021-01-07: qty 21
  Filled 2021-01-07: qty 1
  Filled 2021-01-07: qty 21
  Filled 2021-01-07 (×3): qty 1
  Filled 2021-01-07: qty 21
  Filled 2021-01-07 (×2): qty 1
  Filled 2021-01-07: qty 2
  Filled 2021-01-07 (×9): qty 1

## 2021-01-07 MED ORDER — AMLODIPINE BESYLATE 10 MG PO TABS
10.0000 mg | ORAL_TABLET | Freq: Every day | ORAL | Status: DC
  Administered 2021-01-08 – 2021-01-12 (×5): 10 mg via ORAL
  Filled 2021-01-07 (×4): qty 1
  Filled 2021-01-07: qty 7
  Filled 2021-01-07 (×2): qty 1

## 2021-01-07 MED ORDER — LAMOTRIGINE 25 MG PO TABS
25.0000 mg | ORAL_TABLET | Freq: Two times a day (BID) | ORAL | Status: DC
  Administered 2021-01-07 – 2021-01-12 (×10): 25 mg via ORAL
  Filled 2021-01-07 (×6): qty 1
  Filled 2021-01-07: qty 14
  Filled 2021-01-07: qty 1
  Filled 2021-01-07: qty 14
  Filled 2021-01-07 (×6): qty 1

## 2021-01-07 MED ORDER — BETHANECHOL CHLORIDE 5 MG PO TABS
5.0000 mg | ORAL_TABLET | Freq: Three times a day (TID) | ORAL | Status: DC
  Filled 2021-01-07 (×8): qty 1

## 2021-01-07 MED ORDER — DOCUSATE SODIUM 100 MG PO CAPS: 100.0000 mg | ORAL_CAPSULE | Freq: Two times a day (BID) | ORAL | Status: DC | PRN

## 2021-01-07 NOTE — Progress Notes (Signed)
Patient information has been sent to [facility] via secure chat to review for potential admission. Patient meets inpatient criteria per Hampton Abbot, MD   Situation ongoing, CSW will continue to monitor progress.    Signed:  Mariea Clonts, MSW, LCSW-A  01/07/2021 10:21 AM

## 2021-01-07 NOTE — Progress Notes (Signed)
Regina is a 40 year old male being admitted voluntarily to 300-1 from [facility] med floor.  He was medically admitted after intentional OD on Seroquel, trazodone and alcohol.  He is currently being seen at [facility] for medication management.  During [facility] admission, he was pleasant but withdrawn.  He continues to report suicidal ideation but verbally agrees to not harm himself on the unit.  He states his stressors are medical problems, losing his apartment and the death of his aunt.  He denies HI or AVH.  Oriented him to the unit.  Admission paperwork completed and signed.  Suicide safety plan reviewed, given to patient to complete and return to his nurse.    Belongings searched and secured in locker # 20, no contraband found.  Skin assessment completed and has several scars in multiple areas post surgery. Scars are healed and no signs of infection. Q 15 minute checks initiated for safety.  We will continue to monitor the progress towards his goals.

## 2021-01-07 NOTE — Progress Notes (Signed)
Pt accepted to [facility]    Patient meets inpatient criteria per Hampton Abbot, MD   Dr. Viann Fish is the attending provider.    Call report to [phone]    Marney Doctor, RN @ [facility] ED notified.     Pt scheduled  to arrive at [facility], MSW, LCSW-A  10:52 AM 01/07/2021

## 2021-01-07 NOTE — Progress Notes (Signed)
Pt transferred to [facility] by transport team. Pt's personal belongings and discharge packet given to transport team to be sent with pt. Report called to receiving facility.

## 2021-01-07 NOTE — Tx Team (Signed)
Initial Treatment Plan 01/07/2021 4:56 PM [name] TTC:[id number]    PATIENT STRESSORS: Financial difficulties Health problems Substance abuse   PATIENT STRENGTHS: Capable of independent living General fund of knowledge   PATIENT IDENTIFIED PROBLEMS: Depression  Suicidal ideation    "My mind is everywhere, I can't think of a goal at this time"               DISCHARGE CRITERIA:  Improved stabilization in mood, thinking, and/or behavior Motivation to continue treatment in a less acute level of care Need for constant or close observation no longer present Safe-care adequate arrangements made  PRELIMINARY DISCHARGE PLAN: Outpatient therapy  PATIENT/FAMILY INVOLVEMENT: This treatment plan has been presented to and reviewed with the patient, [name].  The patient and family have been given the opportunity to ask questions and make suggestions.  Windell Moment, RN 01/07/2021, 4:56 PM

## 2021-01-07 NOTE — Progress Notes (Signed)
Psychoeducational Group Note  Date:  01/07/2021 Time:  2109  Group Topic/Focus:  Wrap-Up Group:   The focus of this group is to help patients review their daily goal of treatment and discuss progress on daily workbooks.  Participation Level: Did Not Attend  Participation Quality:  Not Applicable  Affect:  Not Applicable  Cognitive:  Not Applicable  Insight:  Not Applicable  Engagement in Group: Not Applicable  Additional Comments:  The patient did not attend group this evening.   Archie Balboa S 01/07/2021, 9:09 PM

## 2021-01-07 NOTE — Discharge Summary (Signed)
Physician Discharge Summary  [name] PFX:[id number] DOB: 08/31/1980 DOA: 01/03/2021  PCP: Maudie Mercury, MD  Admit date: 01/03/2021 Discharge date: 01/07/2021  Time spent: 60 minutes  Recommendations for Outpatient Follow-up:  Patient will discharge to [facility]  Magnesium was low at 1.4, 4 g of magnesium sulfate IV x1 given today before discharge.  -Recommend to check serum magnesium level in a.m.   Discharge Diagnoses:  Principal Problem:   Severe recurrent major depressive disorder with psychotic features [facility]) Active Problems:   Tylenol overdose   Acute respiratory failure [facility])   Discharge Condition: Stable  Diet recommendation: Heart healthy diet  Filed Weights   01/04/21 0500 01/05/21 0500 01/06/21 0456  Weight: 78 kg 74.4 kg 72.9 kg    History of present illness:   40 year-old male with history of bipolar disorder, suicide attempt, polysubstance abuse, admitted on 01/03/2021 after intentional overdose of Seroquel, trazodone and alcohol.  Last drink was on 01/03/2021.  UDS was positive for cocaine and benzodiazepine.  QTC was prolonged which has been resolved.  He required intubation mechanical ventilation for airway protection.  He was extubated and breathing is comfortable.  He was on Precedex gtt. for alcohol withdrawal which has been discontinued.    Hospital Course:   Acute hypoxemic respiratory failure -Secondary to ingestion of Seroquel, trazodone and alcohol -Resolved -Required intubation; now extubated -O2 sats 100% on room air  Suicide attempt -Seen by psychiatry -Inpatient psychiatry treatment recommended -Patient to be transferred to [facility] today  Alcohol withdrawal -resolved -Continue thiamine -Off Precedex gtt.  Bipolar disorder -resume home meds  Hypertension -BP still elevated - continue amlodipine. -will add Losartan 25 mg po daily   Hypomagnesemia - magnesium is 1.4; magnesium sulfate 4 g IV  x 1 given - consider rechecking  magnesium level in am.   Polysubstance abuse -HIV negative -Hepatitis B and hepatitis C antibody are also negative -We will need counseling at inpatient psych center  Procedures:   Consultations: Psychiatry  Discharge Exam: Vitals:   01/07/21 0126 01/07/21 0431  BP: (!) 120/95 (!) 129/104  Pulse: (!) 108 (!) 106  Resp: 18 16  Temp: 98 F (36.7 C) 98.7 F (37.1 C)  SpO2: 97% 100%    General: Appears in no acute distress Cardiovascular: S1-S2, regular Respiratory: Clear to auscultation bilaterally  Discharge Instructions   Discharge Instructions     Diet - low sodium heart healthy   Complete by: As directed    Increase activity slowly   Complete by: As directed       Allergies as of 01/07/2021       Reactions   Codeine Hives, Itching, Swelling, Other (See Comments)   Does not impair breathing, however   Penicillins Swelling, Other (See Comments)   Has patient had a PCN reaction causing immediate rash, facial/tongue/throat swelling, SOB or lightheadedness with hypotension: Yes Has patient had a PCN reaction causing severe rash involving mucus membranes or skin necrosis: Yes Has patient had a PCN reaction that required hospitalization Yes-ed visit Has patient had a PCN reaction occurring within the last 10 years: Yes If all of the above answers are "NO", then may proceed with Cephalosporin use.   Morphine Itching   Coconut Flavor Itching, Swelling, Other (See Comments)   Cannot take with some of his meds (also)   Coconut Oil Itching, Swelling, Other (See Comments)   Cannot take with some of his meds (also)   Grapefruit Concentrate Other (See Comments)   Cannot take with  some of his meds   Morphine And Related Itching, Swelling   Oxycodone Itching, Swelling   Norco [hydrocodone-acetaminophen] Itching, Rash        Medication List     TAKE these medications    amLODipine 10 MG tablet Commonly known as: NORVASC Take 1 tablet (10 mg total) by mouth  daily. For high blood pressure What changed: additional instructions   busPIRone 10 MG tablet Commonly known as: BUSPAR Take 1 tablet (10 mg total) by mouth 3 (three) times daily. For anxiety What changed: additional instructions   gabapentin 300 MG capsule Commonly known as: NEURONTIN TAKE 3 CAPSULES BY MOUTH THREE TIMES A DAY What changed: See the new instructions.   hydrOXYzine 50 MG tablet Commonly known as: ATARAX/VISTARIL Take 1 tablet (50 mg total) by mouth 3 (three) times daily as needed. What changed: when to take this   lamoTRIgine 25 MG tablet Commonly known as: LAMICTAL Take 1 tablet (25 mg total) by mouth 2 (two) times daily. What changed:  how much to take when to take this   losartan 25 MG tablet Commonly known as: Cozaar Take 1 tablet (25 mg total) by mouth daily.   ondansetron 4 MG tablet Commonly known as: ZOFRAN Take 1 tablet (4 mg total) by mouth every 8 (eight) hours as needed for nausea or vomiting.   QUEtiapine 300 MG tablet Commonly known as: SEROQUEL Take 1 tablet (300 mg total) by mouth at bedtime.   rosuvastatin 10 MG tablet Commonly known as: CRESTOR Take 10 mg by mouth daily.   thiamine 100 MG tablet Take 1 tablet (100 mg total) by mouth daily. Start taking on: January 08, 2021   traZODone 100 MG tablet Commonly known as: DESYREL Take 100 mg by mouth at bedtime.       Allergies  Allergen Reactions   Codeine Hives, Itching, Swelling and Other (See Comments)    Does not impair breathing, however   Penicillins Swelling and Other (See Comments)    Has patient had a PCN reaction causing immediate rash, facial/tongue/throat swelling, SOB or lightheadedness with hypotension: Yes Has patient had a PCN reaction causing severe rash involving mucus membranes or skin necrosis: Yes Has patient had a PCN reaction that required hospitalization Yes-ed visit Has patient had a PCN reaction occurring within the last 10 years: Yes If all of the above  answers are "NO", then may proceed with Cephalosporin use.    Morphine Itching   Coconut Flavor Itching, Swelling and Other (See Comments)    Cannot take with some of his meds (also)   Coconut Oil Itching, Swelling and Other (See Comments)    Cannot take with some of his meds (also)   Grapefruit Concentrate Other (See Comments)    Cannot take with some of his meds   Morphine And Related Itching and Swelling   Oxycodone Itching and Swelling   Norco [Hydrocodone-Acetaminophen] Itching and Rash      The results of significant diagnostics from this hospitalization (including imaging, microbiology, ancillary and laboratory) are listed below for reference.    Significant Diagnostic Studies: DG Chest 1 View  Result Date: 01/04/2021 CLINICAL DATA:  ET tube repositioned. EXAM: CHEST  1 VIEW COMPARISON:  Prior chest radiographs performed 01/04/2021 and earlier. FINDINGS: The ET tube now terminates 2 cm above the level of the carina. An enteric tube passes below the level of the left hemidiaphragm with tip projecting in the region of the stomach. Mild persistent bibasilar atelectasis. No appreciable airspace consolidation  or pulmonary edema. No evidence of pleural effusion or pneumothorax. Prior left clavicular ORIF. IMPRESSION: The ET tube now terminates 2 cm above the level of the carina. The enteric tube passes below the level of the left hemidiaphragm with tip projecting in the expected location of the stomach. Mild persistent bibasilar atelectasis. Electronically Signed   By: Kellie Simmering DO   On: 01/04/2021 11:23   DG Abd 1 View  Result Date: 01/03/2021 CLINICAL DATA:  Repositioning of OG tube. EXAM: ABDOMEN - 1 VIEW COMPARISON:  Chest radiograph 01/03/2021 FINDINGS: OG tube is in a similar position with the tip near the gastric fundus. Mild elevation of the right hemidiaphragm. Normal bowel gas pattern in the upper abdomen. IMPRESSION: Stable appearance of the OG tube with the tip in the gastric  fundus region. Electronically Signed   By: Markus Daft M.D.   On: 01/03/2021 12:08   DG CHEST PORT 1 VIEW  Result Date: 01/05/2021 CLINICAL DATA:  Acute respiratory failure, intubation EXAM: PORTABLE CHEST 1 VIEW COMPARISON:  Portable exam 0431 hours compared to 01/04/2021 FINDINGS: Tip of endotracheal tube projects 3.3 cm above carina. Nasogastric tube extends into stomach. Normal heart size, mediastinal contours, and pulmonary vascularity. RIGHT basilar atelectasis. Remaining lungs clear. No pleural effusion or pneumothorax. IMPRESSION: RIGHT basilar atelectasis. Electronically Signed   By: Lavonia Dana M.D.   On: 01/05/2021 08:01   DG Chest Port 1 View  Result Date: 01/04/2021 CLINICAL DATA:  ET tube. EXAM: PORTABLE CHEST 1 VIEW COMPARISON:  Prior chest radiographs 01/03/2021 and earlier. FINDINGS: The ET tube terminates 3 cm above the level of the carina. An enteric tube passes below the level of the left hemidiaphragm with tip projecting in the region of the stomach. Heart size within normal limits. Mild right basilar atelectasis. No appreciable airspace consolidation or pulmonary edema. No evidence of pleural effusion or pneumothorax. No acute bony abnormality identified. Prior ORIF of the left clavicle. IMPRESSION: ET tube terminates 3 cm above the level of the carina. The enteric tube passes below the level left hemidiaphragm with tip projecting in the region of the stomach. Mild right basilar atelectasis. Electronically Signed   By: Kellie Simmering DO   On: 01/04/2021 11:24   DG Chest Portable 1 View  Result Date: 01/03/2021 CLINICAL DATA:  Intubation. EXAM: PORTABLE CHEST 1 VIEW COMPARISON:  Prior chest radiographs [id number] and earlier. FINDINGS: ET tube terminates at the level of the clavicular heads. An enteric tube passes below the level left hemidiaphragm with tip projecting in the expected location of the stomach. Low lung volumes with minimal bibasilar atelectasis. No appreciable airspace  consolidation. No evidence of pleural effusion or pneumothorax. No acute bony abnormality identified. Prior ORIF of the left clavicle. IMPRESSION: ET tube terminates at the level of the clavicular heads. Enteric tube with tip projecting in the region of the stomach. Low lung volumes with mild bibasilar atelectasis. No appreciable airspace consolidation. Electronically Signed   By: Kellie Simmering DO   On: 01/03/2021 09:50    Microbiology: Recent Results (from the past 240 hour(s))  Resp Panel by RT-PCR (Flu A&B, Covid) Urine, Clean Catch     Status: None   Collection Time: 01/03/21  9:30 AM   Specimen: Urine, Clean Catch; Nasopharyngeal(NP) swabs in vial transport medium  Result Value Ref Range Status   SARS Coronavirus 2 by RT PCR NEGATIVE NEGATIVE Final    Comment: (NOTE) SARS-CoV-2 target nucleic acids are NOT DETECTED.  The SARS-CoV-2 RNA is generally detectable  in upper respiratory specimens during the acute phase of infection. The lowest concentration of SARS-CoV-2 viral copies this assay can detect is 138 copies/mL. A negative result does not preclude SARS-Cov-2 infection and should not be used as the sole basis for treatment or other patient management decisions. A negative result may occur with  improper specimen collection/handling, submission of specimen other than nasopharyngeal swab, presence of viral mutation(s) within the areas targeted by this assay, and inadequate number of viral copies(<138 copies/mL). A negative result must be combined with clinical observations, patient history, and epidemiological information. The expected result is Negative.  Fact Sheet for Patients:  EntrepreneurPulse.com.au  Fact Sheet for Healthcare Providers:  IncredibleEmployment.be  This test is no t yet approved or cleared by the [place] FDA and  has been authorized for detection and/or diagnosis of SARS-CoV-2 by FDA under an Emergency Use  Authorization (EUA). This EUA will remain  in effect (meaning this test can be used) for the duration of the COVID-19 declaration under Section 564(b)(1) of the Act, 21 U.S.C.section 360bbb-3(b)(1), unless the authorization is terminated  or revoked sooner.       Influenza A by PCR NEGATIVE NEGATIVE Final   Influenza B by PCR NEGATIVE NEGATIVE Final    Comment: (NOTE) The Xpert Xpress SARS-CoV-2/FLU/RSV plus assay is intended as an aid in the diagnosis of influenza from Nasopharyngeal swab specimens and should not be used as a sole basis for treatment. Nasal washings and aspirates are unacceptable for Xpert Xpress SARS-CoV-2/FLU/RSV testing.  Fact Sheet for Patients: EntrepreneurPulse.com.au  Fact Sheet for Healthcare Providers: IncredibleEmployment.be  This test is not yet approved or cleared by the [place] FDA and has been authorized for detection and/or diagnosis of SARS-CoV-2 by FDA under an Emergency Use Authorization (EUA). This EUA will remain in effect (meaning this test can be used) for the duration of the COVID-19 declaration under Section 564(b)(1) of the Act, 21 U.S.C. section 360bbb-3(b)(1), unless the authorization is terminated or revoked.  Performed at [facility], [place] [address] [place], [place] [address]   Blood culture (routine x 2)     Status: None (Preliminary result)   Collection Time: 01/03/21 10:14 AM   Specimen: BLOOD  Result Value Ref Range Status   Specimen Description   Final    BLOOD BLOOD RIGHT FOREARM Performed at [place] [address], [place], [place] [address]    Special Requests   Final    BOTTLES DRAWN AEROBIC AND ANAEROBIC Blood Culture adequate volume Performed at [place] [address], [place], [place] [address]    Culture   Final    NO GROWTH 4 DAYS Performed at [facility], [place] [address] [place], [place]  [address]    Report Status PENDING  Incomplete  Blood culture (routine x 2)     Status: None (Preliminary result)   Collection Time: 01/03/21 10:36 AM   Specimen: BLOOD  Result Value Ref Range Status   Specimen Description   Final    BLOOD BLOOD RIGHT HAND Performed at [place] [address], [place], [place] [address]    Special Requests   Final    BOTTLES DRAWN AEROBIC AND ANAEROBIC Blood Culture results may not be optimal due to an excessive volume of blood received in culture bottles Performed at [place] [address] [place], [place] [address]    Culture   Final    NO GROWTH 4  DAYS Performed at [facility], [place] [address], [place], [place] [address]    Report Status PENDING  Incomplete  MRSA PCR Screening     Status: None   Collection Time: 01/03/21 11:00 AM  Result Value Ref Range Status   MRSA by PCR NEGATIVE NEGATIVE Final    Comment:        The GeneXpert MRSA Assay (FDA approved for NASAL specimens only), is one component of a comprehensive MRSA colonization surveillance program. It is not intended to diagnose MRSA infection nor to guide or monitor treatment for MRSA infections. Performed at [facility], [place] [address] [place], [place] [address]   Culture, Respiratory w Gram Stain     Status: None (Preliminary result)   Collection Time: 01/05/21  8:29 AM   Specimen: Tracheal Aspirate; Respiratory  Result Value Ref Range Status   Specimen Description   Final    TRACHEAL ASPIRATE Performed at [place] [address], [place], [place] [address]    Special Requests   Final    NONE Performed at [facility], [place] [address] [place], [place] [address]    Gram Stain   Final    MODERATE WBC PRESENT, PREDOMINANTLY PMN FEW GRAM NEGATIVE RODS RARE GRAM POSITIVE COCCI RARE GRAM POSITIVE RODS    Culture   Final    CULTURE REINCUBATED FOR BETTER  GROWTH Performed at [facility], [place] [address], [place], [place] [address]    Report Status PENDING  Incomplete  Culture, Urine     Status: None   Collection Time: 01/05/21 11:37 AM   Specimen: Urine, Clean Catch  Result Value Ref Range Status   Specimen Description   Final    URINE, CLEAN CATCH Performed at [facility], [place] [address], [place], [place] [address]    Special Requests   Final    NONE Performed at [facility], [place] [address], [place], [place] [address]    Culture   Final    NO GROWTH Performed at [facility], [place] [address], [place], [place] [address]    Report Status 01/06/2021 FINAL  Final  Resp Panel by RT-PCR (Flu A&B, Covid) Nasopharyngeal Swab     Status: None   Collection Time: 01/07/21 11:35 AM   Specimen: Nasopharyngeal Swab; Nasopharyngeal(NP) swabs in vial transport medium  Result Value Ref Range Status   SARS Coronavirus 2 by RT PCR NEGATIVE NEGATIVE Final    Comment: (NOTE) SARS-CoV-2 target nucleic acids are NOT DETECTED.  The SARS-CoV-2 RNA is generally detectable in upper respiratory specimens during the acute phase of infection. The lowest concentration of SARS-CoV-2 viral copies this assay can detect is 138 copies/mL. A negative result does not preclude SARS-Cov-2 infection and should not be used as the sole basis for treatment or other patient management decisions. A negative result may occur with  improper specimen collection/handling, submission of specimen other than nasopharyngeal swab, presence of viral mutation(s) within the areas targeted by this assay, and inadequate number of viral copies(<138 copies/mL). A negative result must be combined with clinical observations, patient history, and epidemiological information. The expected result is Negative.  Fact Sheet for Patients:  EntrepreneurPulse.com.au  Fact Sheet for Healthcare Providers:   IncredibleEmployment.be  This test is no t yet approved or cleared by the [place] FDA and  has been authorized for detection and/or diagnosis of SARS-CoV-2 by FDA under an Emergency Use Authorization (EUA). This  EUA will remain  in effect (meaning this test can be used) for the duration of the COVID-19 declaration under Section 564(b)(1) of the Act, 21 U.S.C.section 360bbb-3(b)(1), unless the authorization is terminated  or revoked sooner.       Influenza A by PCR NEGATIVE NEGATIVE Final   Influenza B by PCR NEGATIVE NEGATIVE Final    Comment: (NOTE) The Xpert Xpress SARS-CoV-2/FLU/RSV plus assay is intended as an aid in the diagnosis of influenza from Nasopharyngeal swab specimens and should not be used as a sole basis for treatment. Nasal washings and aspirates are unacceptable for Xpert Xpress SARS-CoV-2/FLU/RSV testing.  Fact Sheet for Patients: EntrepreneurPulse.com.au  Fact Sheet for Healthcare Providers: IncredibleEmployment.be  This test is not yet approved or cleared by the [place] FDA and has been authorized for detection and/or diagnosis of SARS-CoV-2 by FDA under an Emergency Use Authorization (EUA). This EUA will remain in effect (meaning this test can be used) for the duration of the COVID-19 declaration under Section 564(b)(1) of the Act, 21 U.S.C. section 360bbb-3(b)(1), unless the authorization is terminated or revoked.  Performed at [facility], [place] [address] [place], [place] [address]      Labs: Basic Metabolic Panel: Recent Labs  Lab 01/03/21 1239 01/03/21 2228 01/04/21 0508 01/05/21 0327 01/05/21 1924 01/06/21 0233 01/07/21 0519  NA  --  142 141  141 139  --  134* 134*  K  --  3.7 3.8  3.7 3.7  --  4.0 3.7  CL  --  110 107  108 107  --  103 105  CO2  --  23 26  26 25   --  24 22  GLUCOSE  --  94 106*  107* 104*  --  89 125*  BUN  --  5* <5*  <5*  <5*  --  7 8  CREATININE  --  0.73 0.82  0.84 0.83  --  0.81 0.79  CALCIUM  --  7.8* 8.5*  8.5* 8.6*  --  8.7* 8.9  MG 1.7 1.6* 1.7  --  1.4*  --   --   PHOS  --  2.2* 1.7*  --  3.2  --   --    Liver Function Tests: Recent Labs  Lab 01/03/21 0903 01/04/21 0508 01/05/21 0327  AST 110* 95* 66*  ALT 53* 57* 46*  ALKPHOS 52 54 66  BILITOT 0.4 0.7 0.7  PROT 6.5 6.7 6.7  ALBUMIN 3.6 3.5 3.4*   No results for input(s): LIPASE, AMYLASE in the last 168 hours. Recent Labs  Lab 01/03/21 0930  AMMONIA 16   CBC: Recent Labs  Lab 01/03/21 0903 01/04/21 0508 01/04/21 2102 01/05/21 0327 01/06/21 0233  WBC 3.5* 3.2*  --  6.2 10.4  NEUTROABS 1.0*  --   --   --   --   HGB 13.3 14.0 15.7 14.7 12.6*  HCT 39.2 40.6 46.1 43.2 37.3*  MCV 86.5 86.6  --  88.0 88.2  PLT 127* 128*  --  109* 86*   Cardiac Enzymes: Recent Labs  Lab 01/04/21 0508 01/05/21 0327  CKTOTAL 607* 365  CKMB 3.2 1.0   BNP: BNP (last 3 results) No results for input(s): BNP in the last 8760 hours.  ProBNP (last 3 results) No results for input(s): PROBNP in the last 8760 hours.  CBG: Recent Labs  Lab 01/06/21 1710 01/06/21 2004 01/07/21 0128 01/07/21 0434 01/07/21 0736  GLUCAP 92 116* 132* 104* 101*  Signed:  Oswald Hillock MD.  [facility] 01/07/2021, 1:00 PM

## 2021-01-07 NOTE — TOC Initial Note (Addendum)
Transition of Care [facility]) - Initial/Assessment Note    Patient Details  Name: [name] MRN: [id number] Date of Birth: 25-Aug-1980  Transition of Care [facility]) CM/SW Contact:    Lynnell Catalan, RN Phone Number: 01/07/2021, 10:23 AM  Clinical Narrative:                 Per psych eval recommendation is for inpt psych placement. [facility] disposition contacted to inquire about inpt bed. TOC will continue to follow.    Addendum: [facility] has accepted pt today. He has signed a Voluntary admission and treatment form. It was faxed to  [phone]. Safe transport called to transport pt to [facility]. Emotional Assessment   Attitude/Demeanor/Rapport: Intubated (Following Commands or Not Following Commands) Affect (typically observed): Unable to Assess Orientation: : Fluctuating Orientation (Suspected and/or reported Sundowners) Alcohol / Substance Use: Alcohol Use, Tobacco Use, Illicit Drugs Psych Involvement: No (comment)  Admission diagnosis:  Acute respiratory failure ([place]) [J96.00] Patient Active Problem List   Diagnosis Date Noted   Acute respiratory failure ([place]) 01/03/2021   Postconcussion syndrome 11/04/2020   Bipolar disorder, in partial remission, most recent episode depressed ([place]) 10/27/2020   Pain of left upper arm 09/15/2020   Dysesthesia 09/15/2020   Abnormality of gait 08/14/2020   Thoracic outlet syndrome 06/05/2020   Bipolar affective disorder, mixed, severe, with psychotic behavior ([place]) 05/22/2020   GAD (generalized anxiety disorder) 05/22/2020   Chronic pain 03/26/2020   Polysubstance dependence including opioid type drug, continuous use ([place]) 08/02/2018   Severe recurrent major depression without psychotic features ([place]) 07/29/2018   Chronic left shoulder pain 01/11/2018   Alcohol withdrawal seizure without complication ([place])    Displaced fracture of lateral end of left clavicle, initial encounter for closed fracture 08/05/2017   Coracoclavicular (ligament) sprain and strain,  left, initial encounter 08/05/2017   Closed dislocation of acromioclavicular joint, initial encounter 08/05/2017   Insomnia    Hypertension    Depression, major, severe recurrence ([place]) 12/30/2015   Substance induced mood disorder ([place]) 12/02/2015   Mood disorder in conditions classified elsewhere    Malnutrition of moderate degree 09/24/2015   Tobacco use disorder 07/16/2015   Drug overdose, intentional ([place]) 07/12/2015   Cocaine abuse with cocaine-induced mood disorder ([place]) 04/11/2015   Overdose 04/10/2015   Severe recurrent major depressive disorder with psychotic features ([place])    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   Suicidal ideation    Tylenol overdose    Polysubstance abuse ([place])    Overdose of acetaminophen 08/03/2014   Cocaine abuse ([place]) 04/16/2014   Thrombocytopenia ([place]) 04/15/2014   Urinary tract infection, site not specified 04/15/2014   Transaminitis 09/24/2013   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

## 2021-01-08 DIAGNOSIS — E871 Hypo-osmolality and hyponatremia: Secondary | ICD-10-CM

## 2021-01-08 DIAGNOSIS — F315 Bipolar disorder, current episode depressed, severe, with psychotic features: Principal | ICD-10-CM

## 2021-01-08 LAB — CULTURE, BLOOD (ROUTINE X 2)
Culture: NO GROWTH
Culture: NO GROWTH
Special Requests: ADEQUATE

## 2021-01-08 LAB — HEMOGLOBIN A1C
Hgb A1c MFr Bld: 4.6 % — ABNORMAL LOW (ref 4.8–5.6)
Mean Plasma Glucose: 85.32 mg/dL

## 2021-01-08 MED ORDER — HYDROXYZINE HCL 25 MG PO TABS
25.0000 mg | ORAL_TABLET | Freq: Four times a day (QID) | ORAL | Status: AC | PRN
  Administered 2021-01-08 – 2021-01-10 (×4): 25 mg via ORAL
  Filled 2021-01-08 (×4): qty 1

## 2021-01-08 MED ORDER — ROSUVASTATIN CALCIUM 20 MG PO TABS
10.0000 mg | ORAL_TABLET | Freq: Every day | ORAL | Status: DC
  Administered 2021-01-09 – 2021-01-12 (×4): 10 mg via ORAL
  Filled 2021-01-08 (×6): qty 1
  Filled 2021-01-08: qty 4

## 2021-01-08 MED ORDER — ARIPIPRAZOLE 10 MG PO TABS
10.0000 mg | ORAL_TABLET | Freq: Every day | ORAL | Status: DC
  Administered 2021-01-08 – 2021-01-12 (×5): 10 mg via ORAL
  Filled 2021-01-08 (×2): qty 1
  Filled 2021-01-08: qty 7
  Filled 2021-01-08 (×5): qty 1

## 2021-01-08 MED ORDER — LOPERAMIDE HCL 2 MG PO CAPS: 2.0000 mg | ORAL_CAPSULE | ORAL | Status: AC | PRN

## 2021-01-08 MED ORDER — PANTOPRAZOLE SODIUM 40 MG PO TBEC
40.0000 mg | DELAYED_RELEASE_TABLET | Freq: Every day | ORAL | Status: DC
  Administered 2021-01-08 – 2021-01-12 (×5): 40 mg via ORAL
  Filled 2021-01-08 (×4): qty 1
  Filled 2021-01-08: qty 7
  Filled 2021-01-08 (×3): qty 1

## 2021-01-08 MED ORDER — ONDANSETRON 4 MG PO TBDP: 4.0000 mg | ORAL_TABLET | Freq: Four times a day (QID) | ORAL | Status: AC | PRN

## 2021-01-08 MED ORDER — LORAZEPAM 1 MG PO TABS: 1.0000 mg | ORAL_TABLET | Freq: Four times a day (QID) | ORAL | Status: AC | PRN
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
## 2021-01-08 NOTE — Progress Notes (Addendum)
   01/08/21 0637  Vital Signs  Temp 99.3 F (37.4 C)  Temp Source Oral  Pulse Rate (!) 110  BP 127/90  BP Method Automatic   D: Patient admits to some passive SI. Pt. Denies HI and AVH. Pt. CIWA this morning was 14. Pt. Isolated in room. Pt. Has a red cracked left heel.  Pt. Isolated in room A:  Patient took scheduled medicine. Pt given 1 mg of Ativan for a CIWA of 13.  Support and encouragement provided. Routine safety checks conducted every 15 minutes. Patient  Informed to notify staff with any concerns.   R:  Safety maintained.
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
## 2021-01-08 NOTE — Progress Notes (Signed)
Psychoeducational Group Note  Date:  01/08/2021 Time:  2110  Group Topic/Focus:  Wrap-Up Group:   The focus of this group is to help patients review their daily goal of treatment and discuss progress on daily workbooks.  Participation Level: Did Not Attend  Participation Quality:  Not Applicable  Affect:  Not Applicable  Cognitive:  Not Applicable  Insight:  Not Applicable  Engagement in Group: Not Applicable  Additional Comments:  The patient did not attend group this evening.   Archie Balboa S 01/08/2021, 9:10 PM

## 2021-01-08 NOTE — BHH Group Notes (Signed)
[place] Group Notes:  (Nursing)  Date:  01/08/2021  Time:  1600  Type of Therapy:  Psychoeducational Skills  Participation Level:  Did Not Attend  Waymond Cera 01/08/2021, 5:37 PM
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
## 2021-01-08 NOTE — Progress Notes (Signed)
NUTRITION ASSESSMENT RD working remotely.   Pt identified as at risk on the Malnutrition Screen Tool  INTERVENTION: [facility] staff to continue to encourage PO intakes of meals and snacks.   NUTRITION DIAGNOSIS: Unintentional weight loss related to sub-optimal intake as evidenced by pt report.   Goal: Pt to meet >/= 90% of their estimated nutrition needs.  Monitor:  PO intake  Assessment:  Patient transferred from [facility] to [facility] yesterday.  He was admitted to [facility] d/t intention OD on seroquel, trazodone, and alcohol.   Weight yesterday was 157 lb and weight on 10/10/20 was 158 lb.   40 y.o. male  Height: Ht Readings from Last 1 Encounters:  01/07/21 5\' 6"  (1.676 m)    Weight: Wt Readings from Last 1 Encounters:  01/07/21 71.2 kg    Weight Hx: Wt Readings from Last 10 Encounters:  01/07/21 71.2 kg  01/06/21 72.9 kg  11/04/20 75.8 kg  11/01/20 74.8 kg  10/27/20 74.4 kg  10/15/20 71.6 kg  10/10/20 71.7 kg  09/15/20 73.5 kg  08/14/20 75.4 kg  07/03/20 72.3 kg    BMI:  Body mass index is 25.34 kg/m. Pt meets criteria for overweight status based on current BMI.  Estimated Nutritional Needs: Kcal: 25-30 kcal/kg Protein: > 1 gram protein/kg Fluid: 1 ml/kcal  Diet Order:  Diet Order             Diet regular Room service appropriate? Yes; Fluid consistency: Thin  Diet effective now                  Pt is also offered choice of unit snacks mid-morning and mid-afternoon.  Pt is eating as desired.   Lab results and medications reviewed.      Jarome Matin, MS, RD, LDN, CNSC Inpatient Clinical Dietitian RD pager # available in [place]  After hours/weekend pager # available in [facility]
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
## 2021-01-08 NOTE — Progress Notes (Signed)
   01/07/21 2048  Psych Admission Type (Psych Patients Only)  Admission Status Voluntary  Psychosocial Assessment  Patient Complaints Anxiety;Depression  Eye Contact Brief  Facial Expression Sad  Affect Sad  Speech Logical/coherent;Soft  Interaction Minimal  Motor Activity Slow  Appearance/Hygiene Unremarkable;In scrubs  Behavior Characteristics Cooperative  Mood Depressed  Thought Process  Coherency WDL  Content WDL  Delusions WDL  Perception WDL  Hallucination None reported or observed  Judgment Impaired  Confusion WDL  Danger to Self  Current suicidal ideation? Passive  Self-Injurious Behavior Some self-injurious ideation observed or expressed.  No lethal plan expressed   Agreement Not to Harm Self Yes  Description of Agreement Verbal agreement to not harm self  Danger to Others  Danger to Others None reported or observed

## 2021-01-09 LAB — MAGNESIUM: Magnesium: 2.2 mg/dL (ref 1.7–2.4)

## 2021-01-09 LAB — CBC WITH DIFFERENTIAL/PLATELET
Abs Immature Granulocytes: 0.09 10*3/uL — ABNORMAL HIGH (ref 0.00–0.07)
Basophils Absolute: 0 10*3/uL (ref 0.0–0.1)
Basophils Relative: 1 %
Eosinophils Absolute: 0.1 10*3/uL (ref 0.0–0.5)
Eosinophils Relative: 2 %
HCT: 44 % (ref 39.0–52.0)
Hemoglobin: 14.5 g/dL (ref 13.0–17.0)
Immature Granulocytes: 1 %
Lymphocytes Relative: 30 %
Lymphs Abs: 1.9 10*3/uL (ref 0.7–4.0)
MCH: 29.7 pg (ref 26.0–34.0)
MCHC: 33 g/dL (ref 30.0–36.0)
MCV: 90.2 fL (ref 80.0–100.0)
Monocytes Absolute: 0.6 10*3/uL (ref 0.1–1.0)
Monocytes Relative: 9 %
Neutro Abs: 3.6 10*3/uL (ref 1.7–7.7)
Neutrophils Relative %: 57 %
Platelets: 161 10*3/uL (ref 150–400)
RBC: 4.88 MIL/uL (ref 4.22–5.81)
RDW: 15.4 % (ref 11.5–15.5)
WBC: 6.3 10*3/uL (ref 4.0–10.5)
nRBC: 0 % (ref 0.0–0.2)

## 2021-01-09 LAB — LIPID PANEL
Cholesterol: 322 mg/dL — ABNORMAL HIGH (ref 0–200)
HDL: 51 mg/dL (ref >40)
LDL Cholesterol: 250 mg/dL — ABNORMAL HIGH (ref 0–99)
Total CHOL/HDL Ratio: 6.3 RATIO
Triglycerides: 104 mg/dL (ref <150)
VLDL: 21 mg/dL (ref 0–40)

## 2021-01-09 LAB — BASIC METABOLIC PANEL
Anion gap: 12 (ref 5–15)
BUN: 15 mg/dL (ref 6–20)
CO2: 20 mmol/L — ABNORMAL LOW (ref 22–32)
Calcium: 9.2 mg/dL (ref 8.9–10.3)
Chloride: 102 mmol/L (ref 98–111)
Creatinine, Ser: 0.88 mg/dL (ref 0.61–1.24)
GFR, Estimated: >60 mL/min (ref >60)
Glucose, Bld: 129 mg/dL — ABNORMAL HIGH (ref 70–99)
Potassium: 4.4 mmol/L (ref 3.5–5.1)
Sodium: 134 mmol/L — ABNORMAL LOW (ref 135–145)

## 2021-01-09 LAB — TSH: TSH: 0.694 u[IU]/mL (ref 0.350–4.500)

## 2021-01-09 MED ORDER — SERTRALINE HCL 25 MG PO TABS
25.0000 mg | ORAL_TABLET | Freq: Every day | ORAL | Status: DC
  Administered 2021-01-09 – 2021-01-11 (×3): 25 mg via ORAL
  Filled 2021-01-09: qty 7
  Filled 2021-01-09 (×5): qty 1

## 2021-01-09 MED ORDER — ACETAMINOPHEN 325 MG PO TABS
650.0000 mg | ORAL_TABLET | Freq: Four times a day (QID) | ORAL | Status: DC | PRN
  Administered 2021-01-09 – 2021-01-12 (×7): 650 mg via ORAL
  Filled 2021-01-09 (×7): qty 2

## 2021-01-09 MED ORDER — TRAZODONE HCL 100 MG PO TABS
100.0000 mg | ORAL_TABLET | Freq: Every evening | ORAL | Status: DC | PRN
  Administered 2021-01-09 – 2021-01-11 (×3): 100 mg via ORAL
  Filled 2021-01-09: qty 1
  Filled 2021-01-09: qty 7
  Filled 2021-01-09 (×2): qty 1

## 2021-01-09 NOTE — Tx Team (Signed)
Interdisciplinary Treatment and Diagnostic Plan Update  01/09/2021 Time of Session:  [name] MRN: [id number]  Principal Diagnosis: Bipolar disorder, curr episode depressed, severe, w/psychotic features ([place])  Secondary Diagnoses: Principal Problem:   Bipolar disorder, curr episode depressed, severe, w/psychotic features ([place]) Active Problems:   HYPERCHOLESTEROLEMIA   Essential hypertension   Alcohol abuse   Seizure disorder (HCC)   Thrombocytopenia ([place])   Cocaine abuse ([place])   Hyponatremia   Hypomagnesemia   Current Medications:  Current Facility-Administered Medications  Medication Dose Route Frequency Provider Last Rate Last Admin   alum & mag hydroxide-simeth (MAALOX/MYLANTA) 200-200-20 MG/5ML suspension 30 mL  30 mL Oral Q4H PRN Starkes-Perry, Gayland Curry, FNP       amLODipine (NORVASC) tablet 10 mg  10 mg Oral Daily Suella Broad, FNP   10 mg at 01/09/21 0803   ARIPiprazole (ABILIFY) tablet 10 mg  10 mg Oral Daily Nelda Marseille, [name] E, MD   10 mg at 01/09/21 0803   busPIRone (BUSPAR) tablet 10 mg  10 mg Oral TID Suella Broad, FNP   10 mg at 01/09/21 0804   docusate sodium (COLACE) capsule 100 mg  100 mg Oral BID PRN Suella Broad, FNP       gabapentin (NEURONTIN) capsule 300 mg  300 mg Oral TID Suella Broad, FNP   300 mg at 01/09/21 0804   hydrOXYzine (ATARAX/VISTARIL) tablet 25 mg  25 mg Oral Q6H PRN Harlow Asa, MD   25 mg at 01/09/21 0114   lamoTRIgine (LAMICTAL) tablet 25 mg  25 mg Oral BID Suella Broad, FNP   25 mg at 01/09/21 5993   loperamide (IMODIUM) capsule 2-4 mg  2-4 mg Oral PRN Harlow Asa, MD       LORazepam (ATIVAN) tablet 1 mg  1 mg Oral Q6H PRN Nelda Marseille, [name] E, MD       magnesium hydroxide (MILK OF MAGNESIA) suspension 30 mL  30 mL Oral Daily PRN Starkes-Perry, Gayland Curry, FNP       multivitamin with minerals tablet 1 tablet  1 tablet Oral Daily Suella Broad, FNP   1 tablet at 01/09/21 0803    ondansetron (ZOFRAN-ODT) disintegrating tablet 4 mg  4 mg Oral Q6H PRN Harlow Asa, MD       pantoprazole (PROTONIX) EC tablet 40 mg  40 mg Oral Daily Nelda Marseille, [name] E, MD   40 mg at 01/09/21 0804   phenol (CHLORASEPTIC) mouth spray 1 spray  1 spray Mouth/Throat PRN Suella Broad, FNP       rosuvastatin (CRESTOR) tablet 10 mg  10 mg Oral Daily Nelda Marseille, [name] E, MD   10 mg at 01/09/21 5701   thiamine tablet 100 mg  100 mg Oral Daily Suella Broad, FNP   100 mg at 01/09/21 0803   PTA Medications: Medications Prior to Admission  Medication Sig Dispense Refill Last Dose   amLODipine (NORVASC) 10 MG tablet Take 1 tablet (10 mg total) by mouth daily. For high blood pressure 90 tablet 0    busPIRone (BUSPAR) 10 MG tablet Take 1 tablet (10 mg total) by mouth 3 (three) times daily. For anxiety 90 tablet 2    gabapentin (NEURONTIN) 300 MG capsule TAKE 3 CAPSULES BY MOUTH THREE TIMES A DAY 270 capsule 1    hydrOXYzine (ATARAX/VISTARIL) 50 MG tablet Take 1 tablet (50 mg total) by mouth 3 (three) times daily as needed. 90 tablet 2    lamoTRIgine (LAMICTAL) 25 MG tablet  Take 1 tablet (25 mg total) by mouth 2 (two) times daily.      losartan (COZAAR) 25 MG tablet Take 1 tablet (25 mg total) by mouth daily. 30 tablet 11    QUEtiapine (SEROQUEL) 300 MG tablet Take 1 tablet (300 mg total) by mouth at bedtime. 30 tablet 2    rosuvastatin (CRESTOR) 10 MG tablet Take 10 mg by mouth daily.      traZODone (DESYREL) 100 MG tablet Take 100 mg by mouth at bedtime.       Patient Stressors: Financial difficulties Health problems Substance abuse  Patient Strengths: Capable of independent living General fund of knowledge  Treatment Modalities: Medication Management, Group therapy, Case management,  1 to 1 session with clinician, Psychoeducation, Recreational therapy.   Physician Treatment Plan for Primary Diagnosis: Bipolar disorder, curr episode depressed, severe, w/psychotic features  ([place]) Long Term Goal(s): Improvement in symptoms so as ready for discharge   Short Term Goals: Ability to identify triggers associated with substance abuse/mental health issues will improve Ability to verbalize feelings will improve Ability to demonstrate self-control will improve Ability to identify and develop effective coping behaviors will improve  Medication Management: Evaluate patient's response, side effects, and tolerance of medication regimen.  Therapeutic Interventions: 1 to 1 sessions, Unit Group sessions and Medication administration.  Evaluation of Outcomes: Progressing  Physician Treatment Plan for Secondary Diagnosis: Principal Problem:   Bipolar disorder, curr episode depressed, severe, w/psychotic features ([name]) Active Problems:   HYPERCHOLESTEROLEMIA   Essential hypertension   Alcohol abuse   Seizure disorder (HCC)   Thrombocytopenia ([place])   Cocaine abuse ([place])   Hyponatremia   Hypomagnesemia  Long Term Goal(s): Improvement in symptoms so as ready for discharge   Short Term Goals: Ability to identify triggers associated with substance abuse/mental health issues will improve Ability to verbalize feelings will improve Ability to demonstrate self-control will improve Ability to identify and develop effective coping behaviors will improve     Medication Management: Evaluate patient's response, side effects, and tolerance of medication regimen.  Therapeutic Interventions: 1 to 1 sessions, Unit Group sessions and Medication administration.  Evaluation of Outcomes: Progressing   RN Treatment Plan for Primary Diagnosis: Bipolar disorder, curr episode depressed, severe, w/psychotic features ([name]) Long Term Goal(s): Knowledge of disease and therapeutic regimen to maintain health will improve  Short Term Goals: Ability to demonstrate self-control, Ability to participate in decision making will improve, and Ability to verbalize feelings will improve  Medication  Management: RN will administer medications as ordered by provider, will assess and evaluate patient's response and provide education to patient for prescribed medication. RN will report any adverse and/or side effects to prescribing provider.  Therapeutic Interventions: 1 on 1 counseling sessions, Psychoeducation, Medication administration, Evaluate responses to treatment, Monitor vital signs and CBGs as ordered, Perform/monitor CIWA, COWS, AIMS and Fall Risk screenings as ordered, Perform wound care treatments as ordered.  Evaluation of Outcomes: Progressing   LCSW Treatment Plan for Primary Diagnosis: Bipolar disorder, curr episode depressed, severe, w/psychotic features ([place]) Long Term Goal(s): Safe transition to appropriate next level of care at discharge, Engage patient in therapeutic group addressing interpersonal concerns.  Short Term Goals: Engage patient in aftercare planning with referrals and resources, Increase social support, and Increase ability to appropriately verbalize feelings  Therapeutic Interventions: Assess for all discharge needs, 1 to 1 time with Social worker, Explore available resources and support systems, Assess for adequacy in community support network, Educate family and significant other(s) on suicide prevention, Complete Psychosocial  Assessment, Interpersonal group therapy.  Evaluation of Outcomes: Progressing   Progress in Treatment: Attending groups: No. Participating in groups: No. Taking medication as prescribed: Yes. Toleration medication: Yes. Family/Significant other contact made: No, will contact:  mother Patient understands diagnosis: Yes. Discussing patient identified problems/goals with staff: Yes. Medical problems stabilized or resolved: Yes. Denies suicidal/homicidal ideation: Yes. Issues/concerns per patient self-inventory: No. Other: None  New problem(s) identified: No, Describe:  none  New Short Term/Long Term Goal(s):medication  stabilization, elimination of SI thoughts, development of comprehensive mental wellness plan.   Patient Goals:  "to get on different medications."  Discharge Plan or Barriers: Patient recently admitted. CSW will continue to follow and assess for appropriate referrals and possible discharge planning.   Reason for Continuation of Hospitalization: Medication stabilization Suicidal ideation Withdrawal symptoms  Estimated Length of Stay: 3-5 days  Attendees: Patient: [name] 01/09/2021   Physician:  01/09/2021   Nursing:  01/09/2021   RN Care Manager: 01/09/2021   Social Worker: Toney Reil, Latanya Presser 01/09/2021   Recreational Therapist:  01/09/2021   Other:  01/09/2021   Other:  01/09/2021   Other: 01/09/2021     Scribe for Treatment Team: [name], Latanya Presser 01/09/2021 9:47 AM

## 2021-01-09 NOTE — Progress Notes (Signed)
Adult Psychoeducational Group Note  Date:  01/09/2021 Time:  10:08 AM  Group Topic/Focus:  Goals Group:   The focus of this group is to help patients establish daily goals to achieve during treatment and discuss how the patient can incorporate goal setting into their daily lives to aide in recovery.  Participation Level:  Active  Participation Quality:  Appropriate  Affect:  Appropriate  Cognitive:  Appropriate  Insight: Appropriate  Engagement in Group:  Engaged  Modes of Intervention:  Discussion  Additional Comments:  Pt stated his goal for the day is to have a positive attitude.  Tonia Brooms D 01/09/2021, 10:08 AM

## 2021-01-09 NOTE — Progress Notes (Signed)
DAR Note: Patient calm and cooperative, denies  SI/HI/AVH, affect is blunted and mood is depressed, pt complained of insomnia last night, got only 3.5hours of sleep, Dr. Nelda Marseille made aware. Pt also complaining of pain at the sole of his foot, asking for Ibuprofen, NP notified. Pt is currently being maintained on Q15 minute checks for safety, will continue to monitor.   01/09/21 0951  Psych Admission Type (Psych Patients Only)  Admission Status Voluntary  Psychosocial Assessment  Patient Complaints Depression  Eye Contact Brief  Facial Expression Sad  Affect Sad  Speech Logical/coherent;Soft  Interaction Minimal  Motor Activity Slow  Appearance/Hygiene Unremarkable;In scrubs  Behavior Characteristics Cooperative  Mood Depressed  Thought Process  Coherency WDL  Content WDL  Delusions WDL  Perception WDL  Hallucination None reported or observed  Judgment Impaired  Confusion WDL  Danger to Self  Current suicidal ideation? Denies  Self-Injurious Behavior No self-injurious ideation or behavior indicators observed or expressed   Agreement Not to Harm Self Yes  Description of Agreement Verbal agreement to not harm self  Danger to Others  Danger to Others None reported or observed

## 2021-01-09 NOTE — BHH Group Notes (Signed)
LCSW Group Therapy Note   01/09/2021    Type of Therapy and Topic:  Group Therapy:  Positive Affirmations   Participation Level:  Did Not Attend  Description of Group: This group addressed positive affirmation toward self and others. Patients went around the room and identified two positive things about themselves and two positive things about a peer in the room. Patients reflected on how it felt to share something positive with others, to identify positive things about themselves, and to hear positive things from others. Patients were encouraged to have a daily reflection of positive characteristics or circumstances.  Therapeutic Goals Patient will verbalize two of their positive qualities Patient will demonstrate empathy for others by stating two positive qualities about a peer in the group Patient will verbalize their feelings when voicing positive self affirmations and when voicing positive affirmations of others Patients will discuss the potential positive impact on their wellness/recovery of focusing on positive traits of self and others. Summary of Patient Progress:    Therapeutic Modalities Cognitive Behavioral Therapy Motivational Interviewing  Eliott Nine 01/09/2021 1:51 PM

## 2021-01-09 NOTE — BHH Counselor (Signed)
CSW gave pt homeless/orange card packet.  Toney Reil, [place] Worker [employer]

## 2021-01-09 NOTE — Progress Notes (Signed)
Recreation Therapy Notes  Date:  6.24.22 Time: 0930 Location: [address] Dayroom  Group Topic: Stress Management  Goal Area(s) Addresses:  Patient will identify positive stress management techniques. Patient will identify benefits of using stress management post d/c.  Intervention: Stress Management   Activity: Meditation.  LRT play a meditation for patients to calm and relax the body.    Education:  Stress Management, Discharge Planning.   Education Outcome: Acknowledges Education  Clinical Observations/Feedback:  LRT unable to do group due to fire drill and goals group running over time.   Victorino Sparrow, LRT/CTRS      Victorino Sparrow A 01/09/2021 10:39 AM

## 2021-01-09 NOTE — Progress Notes (Signed)
DAR NOTE: Pt present with flat affect and depressed mood in the unit. Pt has been isolating himself and has been bed most of the time. Pt denies physical pain. Pt's safety ensured with 15 minute and environmental checks. Pt currently denies SI/HI and A/V hallucinations. Pt verbally agrees to seek staff if SI/HI or A/VH occurs and to consult with staff before acting on these thoughts. Will continue POC.  

## 2021-01-09 NOTE — Plan of Care (Signed)
Nurses confirm that patient has no allergy to Tylenol. Medication ordered PRN for pain.   Viann Fish, MD, Alda Ponder

## 2021-01-10 NOTE — Progress Notes (Signed)
Adult Psychoeducational Group Note  Date:  01/10/2021 Time:  9:25 AM  Group Topic/Focus:  Goals Group:   The focus of this group is to help patients establish daily goals to achieve during treatment and discuss how the patient can incorporate goal setting into their daily lives to aide in recovery.  Participation Level:  Active  Participation Quality:  Appropriate  Affect:  Appropriate  Cognitive:  Alert and Appropriate  Insight: Good  Engagement in Group:  Engaged  Modes of Intervention:  Discussion  Additional Comments:  Pt has a goal of discharging today, as well as speaking with the Education officer, museum.   [name] 01/10/2021, 9:25 AM

## 2021-01-10 NOTE — Progress Notes (Signed)
   01/10/21 2144  Psych Admission Type (Psych Patients Only)  Admission Status Voluntary  Psychosocial Assessment  Patient Complaints Insomnia  Eye Contact Brief  Facial Expression Flat  Affect Appropriate to circumstance  Speech Logical/coherent  Interaction Minimal  Motor Activity Other (Comment) (WDL)  Appearance/Hygiene Unremarkable  Behavior Characteristics Appropriate to situation  Mood Pleasant;Depressed  Thought Process  Coherency WDL  Content WDL  Delusions None reported or observed  Perception WDL  Hallucination None reported or observed  Judgment Impaired  Confusion None  Danger to Self  Current suicidal ideation? Denies  Self-Injurious Behavior No self-injurious ideation or behavior indicators observed or expressed   Agreement Not to Harm Self Yes  Description of Agreement Verbal agreement to not harm self  Danger to Others  Danger to Others None reported or observed

## 2021-01-10 NOTE — [facility] Group Notes (Signed)
Adult Psychoeducational Group Note  Date:  01/10/2021 Time:  12:00 PM  Group Topic/Focus:  Goals Group:   The focus of this group is to help patients establish daily goals to achieve during treatment and discuss how the patient can incorporate goal setting into their daily lives to aide in recovery.  Participation Level:  Active  Participation Quality:  Appropriate  Affect:  Appropriate  Cognitive:  Appropriate  Insight: Appropriate  Engagement in Group:  Engaged  Modes of Intervention:  Discussion  Additional Comments:  Patient attended coping skills group and participated.  Tyshun Tuckerman W Brytney Somes 11/02/3843, 12:00 PM
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
## 2021-01-10 NOTE — Progress Notes (Signed)
Psychoeducational Group Note  Date:  01/10/2021 Time:  2052  Group Topic/Focus:  Wrap-Up Group:   The focus of this group is to help patients review their daily goal of treatment and discuss progress on daily workbooks.  Participation Level: Did Not Attend  Participation Quality:  Not Applicable  Affect:  Not Applicable  Cognitive:  Not Applicable  Insight:  Not Applicable  Engagement in Group: Not Applicable  Additional Comments:  The patient did not attend group.   Archie Balboa S 01/10/2021, 8:52 PM

## 2021-01-10 NOTE — Progress Notes (Signed)
DAR Note: Patient calm and cooperative this morning, denies SI/HI/AVH, took all meds as scheduled, complained of b/l shoulder pain, medicated with Tylenol 650mg . Pt denies any other concerns, and is visible on the unit interacting with peers. Q15 minute checks in place for safety.   01/10/21 0923  Psych Admission Type (Psych Patients Only)  Admission Status Voluntary  Psychosocial Assessment  Patient Complaints None  Eye Contact Brief  Facial Expression Sad  Affect Sad  Speech Logical/coherent;Soft  Interaction Minimal  Motor Activity Slow  Appearance/Hygiene Unremarkable;In scrubs  Behavior Characteristics Cooperative  Mood Pleasant;Euthymic  Thought Process  Coherency WDL  Content WDL  Delusions WDL  Perception WDL  Hallucination None reported or observed  Judgment Impaired  Confusion WDL  Danger to Self  Current suicidal ideation? Denies  Self-Injurious Behavior No self-injurious ideation or behavior indicators observed or expressed   Agreement Not to Harm Self Yes  Description of Agreement Verbal agreement to not harm self  Danger to Others  Danger to Others None reported or observed

## 2021-01-10 NOTE — Progress Notes (Signed)
   01/09/21 2318  Psych Admission Type (Psych Patients Only)  Admission Status Voluntary  Psychosocial Assessment  Patient Complaints None  Eye Contact Fair  Facial Expression Flat  Affect Appropriate to circumstance  Speech Logical/coherent  Interaction Assertive  Appearance/Hygiene Unremarkable  Behavior Characteristics Cooperative  Mood Pleasant  Thought Process  Coherency WDL  Content WDL  Delusions WDL  Perception WDL  Hallucination None reported or observed  Judgment WDL  Confusion WDL  Danger to Self  Current suicidal ideation? Denies  Agreement Not to Harm Self No  Danger to Others  Danger to Others None reported or observed

## 2021-01-10 NOTE — Progress Notes (Signed)
Patient did not attend wrap up group. 

## 2021-01-10 NOTE — [facility] Group Notes (Signed)
LCSW Group Therapy Note  01/10/2021   11:00am -12:00 pm  Type of Therapy and Topic:  Group Therapy: Anger Cues and Responses  Participation Level:  Active   Description of Group:   In this group, patients learned how to recognize the physical, cognitive, emotional, and behavioral responses they have to anger-provoking situations.  They identified a recent time they became angry and how they reacted.  They analyzed how their reaction was possibly beneficial and how it was possibly unhelpful.  The group discussed a variety of healthier coping skills that could help with such a situation in the future.  Focus was placed on how helpful it is to recognize the underlying emotions to our anger, because working on those can lead to a more permanent solution as well as our ability to focus on the important rather than the urgent.  Therapeutic Goals: Patients will remember their last incident of anger and how they felt emotionally and physically, what their thoughts were at the time, and how they behaved. Patients will identify how their behavior at that time worked for them, as well as how it worked against them. Patients will explore possible new behaviors to use in future anger situations. Patients will learn that anger itself is normal and cannot be eliminated, and that healthier reactions can assist with resolving conflict rather than worsening situations.  Summary of Patient Progress:  The patient shared that hhis most recent time of anger was after a suicide attempt and said he woke up angry with God but is now thankful he is still here.  Therapeutic Modalities:   Cognitive Behavioral Therapy  Rolanda Jay

## 2021-01-11 MED ORDER — LOPERAMIDE HCL 2 MG PO CAPS: 2.0000 mg | ORAL_CAPSULE | ORAL | Status: DC | PRN

## 2021-01-11 MED ORDER — ONDANSETRON 4 MG PO TBDP: 4.0000 mg | ORAL_TABLET | Freq: Four times a day (QID) | ORAL | Status: DC | PRN

## 2021-01-11 MED ORDER — HYDROXYZINE HCL 25 MG PO TABS
25.0000 mg | ORAL_TABLET | Freq: Four times a day (QID) | ORAL | Status: DC | PRN
  Administered 2021-01-11: 25 mg via ORAL
  Filled 2021-01-11: qty 10
  Filled 2021-01-11: qty 1

## 2021-01-11 MED ORDER — LORAZEPAM 1 MG PO TABS: 1.0000 mg | ORAL_TABLET | Freq: Four times a day (QID) | ORAL | Status: DC | PRN

## 2021-01-11 NOTE — [facility] Suicide Risk Assessment (Signed)
[place] INPATIENT:  Family/Significant Other Suicide Prevention Education  Suicide Prevention Education:  Contact Attempts:  [name] (mom) [phone], (name of family member/significant other) has been identified by the patient as the family member/significant other with whom the patient will be residing, and identified as the person(s) who will aid the patient in the event of a mental health crisis.  With written consent from the patient, two attempts were made to provide suicide prevention education, prior to and/or following the patient's discharge.  We were unsuccessful in providing suicide prevention education.  A suicide education pamphlet was given to the patient to share with family/significant other.  Date and time of first attempt:  01/11/2021  /  2:30 PM   HIPAA-compliant VM left Date and time of second attempt:  CSW team to continue to follow  Maretta Los 01/11/2021, 2:30 PM

## 2021-01-11 NOTE — Progress Notes (Signed)
   01/11/21 2146  Psych Admission Type (Psych Patients Only)  Admission Status Voluntary  Psychosocial Assessment  Patient Complaints Insomnia;Anxiety  Eye Contact Fair  Facial Expression Flat  Affect Appropriate to circumstance  Speech Logical/coherent  Interaction Minimal  Motor Activity Other (Comment) (WDL)  Appearance/Hygiene Unremarkable  Behavior Characteristics Appropriate to situation  Mood Anxious;Pleasant  Thought Process  Coherency WDL  Content WDL  Delusions None reported or observed  Perception WDL  Hallucination None reported or observed  Judgment Impaired  Confusion None  Danger to Self  Current suicidal ideation? Denies  Self-Injurious Behavior No self-injurious ideation or behavior indicators observed or expressed   Agreement Not to Harm Self Yes  Description of Agreement Verbal agreement to not harm self  Danger to Others  Danger to Others None reported or observed

## 2021-01-11 NOTE — [facility] Suicide Risk Assessment (Signed)
[place] INPATIENT:  Family/Significant Other Suicide Prevention Education  Suicide Prevention Education:  Education Completed; [name] (mom) [phone],  (name of family member/significant other) has been identified by the patient as the family member/significant other with whom the patient will be residing, and identified as the person(s) who will aid the patient in the event of a mental health crisis (suicidal ideations/suicide attempt).  With written consent from the patient, the family member/significant other has been provided the following suicide prevention education, prior to the and/or following the discharge of the patient.  The suicide prevention education provided includes the following: Suicide risk factors Suicide prevention and interventions National Suicide Hotline telephone number [employer] assessment telephone number [facility] Emergency Assistance [place] and/or Residential Mobile Crisis Unit telephone number  Request made of family/significant other to: Remove weapons (e.g., guns, rifles, knives), all items previously/currently identified as safety concern.   Remove drugs/medications (over-the-counter, prescriptions, illicit drugs), all items previously/currently identified as a safety concern.  The family member/significant other verbalizes understanding of the suicide prevention education information provided.  The family member/significant other agrees to remove the items of safety concern listed above.  Mother stated that patient will indeed stay with her temporarily at discharge, and there are no guns, alcohol or drugs in her home.  She is very supportive, as is a "sister" he grew up with.  She stated this was patient's first relapse in years, and the first time he has ever ended up in the ICU.  She has looked at all his medicines, the labels of which she keeps in her pocketbook, and is concerned about duplication.  CSW was able to  explore this sufficiently to find that she did not understand the difference between mood swings/depression and anxiety.  Mother would be in support of him going to inpatient rehab for 60-90 days, but was informed that his court date would preclude acceptance to such a program currently.  She is afraid that he is mostly depressed because of his focus on his upcoming court date because he has told her that he could not believe he had acted in such a way as to hurt someone else.  CSW informed her about drug court and mental health court and advised that he ask his public defender about these, as one could benefit him greatly.  Also advised her that the staff at [facility] where patient receives his mental health care can give him information at the right time about any available rehabs.  She was grateful for the information.  She emphasized that the patient is independent and wants to take care of himself, has always refused to apply for disability.  Berlin Hun Grossman-Orr 01/11/2021, 3:56 PM

## 2021-01-11 NOTE — Progress Notes (Signed)
Adult Psychoeducational Group Note  Date:  01/11/2021 Time:  10:10 AM  Group Topic/Focus:  Goals Group:   The focus of this group is to help patients establish daily goals to achieve during treatment and discuss how the patient can incorporate goal setting into their daily lives to aide in recovery.  Participation Level:  Active  Participation Quality:  Appropriate  Affect:  Appropriate  Cognitive:  Alert and Appropriate  Insight: Appropriate, Good, and Improving  Engagement in Group:  Engaged  Modes of Intervention:  Discussion  Additional Comments:  Pt attended group and participated in discussion.  Darcia Lampi R Jing Howatt 01/11/2021, 10:10 AM

## 2021-01-11 NOTE — Progress Notes (Signed)
DAR Note: Patient denies SI/HI/AVH, reports that he had a restful night, reports a high energy level , reports being in a good mood, and reports a good appetite. Pt is visible on the unit interacting with his peers and participating in activities. Pt is being maintained on Q15 minute checks for safejty.   01/11/21 0850  Psych Admission Type (Psych Patients Only)  Admission Status Voluntary  Psychosocial Assessment  Patient Complaints None  Eye Contact Brief  Facial Expression Flat  Affect Appropriate to circumstance  Speech Logical/coherent  Interaction Minimal  Motor Activity Other (Comment) (WDL)  Appearance/Hygiene Unremarkable  Behavior Characteristics Cooperative  Mood Pleasant;Euthymic  Thought Process  Coherency WDL  Content WDL  Delusions None reported or observed  Perception WDL  Hallucination None reported or observed  Judgment Impaired  Confusion None  Danger to Self  Current suicidal ideation? Denies  Self-Injurious Behavior No self-injurious ideation or behavior indicators observed or expressed   Agreement Not to Harm Self Yes  Description of Agreement Verbal agreement to not harm self  Danger to Others  Danger to Others None reported or observed

## 2021-01-11 NOTE — [facility] Group Notes (Signed)
[place] Group Notes:  (Nursing/MHT/Case Management/Adjunct)  Date:  01/11/2021  Time:  4:52 PM  Type of Therapy:   Relaxation therapy  Participation Level:  Active  Participation Quality:  Attentive  Affect:  Blunted  Cognitive:  Appropriate  Insight:  Improving  Engagement in Group:  Improving  Modes of Intervention:  Activity  Summary of Progress/Problems:  Meditation activity.  He participated well with the activity.    Barbette Or Jarmar Rousseau 01/11/2021, 4:52 PM
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
## 2021-01-11 NOTE — [facility] Group Notes (Signed)
[place] LCSW Group Therapy Note  Date/Time:  01/11/2021 9:00-10:00 or 10:00-11:00AM  Type of Therapy and Topic:  Group Therapy:  Healthy and Unhealthy Supports  Participation Level:  Active   Description of Group:  Patients in this group were introduced to the idea of adding a variety of healthy supports to address the various needs in their lives.Patients discussed what additional healthy supports could be helpful in their recovery and wellness after discharge in order to prevent future hospitalizations.   An emphasis was placed on using counselor, doctor, therapy groups, 12-step groups, and problem-specific support groups to expand supports.  They also worked as a group on developing a specific plan for several patients to deal with unhealthy supports through [place], psychoeducation with loved ones, and even termination of relationships.   Therapeutic Goals:   1)  discuss importance of adding supports to stay well once out of the hospital  2)  compare healthy versus unhealthy supports and identify some examples of each  3)  generate ideas and descriptions of healthy supports that can be added  4)  offer mutual support about how to address unhealthy supports  5)  encourage active participation in and adherence to discharge plan    Summary of Patient Progress:  The patient stated that current healthy supports in his life are family while current unhealthy supports include a;so family.  The patient expressed a willingness to add therapy and psychiatry attendance as support(s) to help in his recovery journey.   Therapeutic Modalities:   Motivational Interviewing Brief Solution-Focused Therapy  Rolanda Jay

## 2021-01-11 NOTE — [facility] Group Notes (Signed)
[facility] Group Notes:  (Nursing/MHT/Case Management/Adjunct)  Date:  01/11/2021  Time:  4:06 PM  Type of Therapy:  Nurse Education  Participation Level:  Did Not Attend  Participation Quality:     Affect:    Cognitive:    Insight:    Engagement in Group:    Modes of Intervention:    Summary of Progress/Problems:  Did not attend group despite staff invitation.  Barbette Or Telesa Jeancharles 01/11/2021, 4:06 PM

## 2021-01-12 LAB — BASIC METABOLIC PANEL
Anion gap: 4 — ABNORMAL LOW (ref 5–15)
BUN: 8 mg/dL (ref 6–20)
CO2: 30 mmol/L (ref 22–32)
Calcium: 9.4 mg/dL (ref 8.9–10.3)
Chloride: 102 mmol/L (ref 98–111)
Creatinine, Ser: 0.83 mg/dL (ref 0.61–1.24)
GFR, Estimated: >60 mL/min (ref >60)
Glucose, Bld: 97 mg/dL (ref 70–99)
Potassium: 4.3 mmol/L (ref 3.5–5.1)
Sodium: 136 mmol/L (ref 135–145)

## 2021-01-12 LAB — MAGNESIUM: Magnesium: 2.1 mg/dL (ref 1.7–2.4)

## 2021-01-12 MED ORDER — ARIPIPRAZOLE 10 MG PO TABS: 10.0000 mg | ORAL_TABLET | Freq: Every day | ORAL | 0 refills | Status: DC

## 2021-01-12 MED ORDER — LAMOTRIGINE 25 MG PO TABS: 25.0000 mg | ORAL_TABLET | Freq: Two times a day (BID) | ORAL | 0 refills | Status: DC

## 2021-01-12 MED ORDER — SERTRALINE HCL 25 MG PO TABS: 25.0000 mg | ORAL_TABLET | Freq: Every day | ORAL | 0 refills | Status: DC

## 2021-01-12 MED ORDER — TRAZODONE HCL 100 MG PO TABS: 100.0000 mg | ORAL_TABLET | Freq: Every evening | ORAL | 0 refills | Status: DC | PRN

## 2021-01-12 MED ORDER — AMLODIPINE BESYLATE 10 MG PO TABS: 10.0000 mg | ORAL_TABLET | Freq: Every day | ORAL | 0 refills | Status: DC

## 2021-01-12 MED ORDER — PANTOPRAZOLE SODIUM 40 MG PO TBEC: 40.0000 mg | DELAYED_RELEASE_TABLET | Freq: Every day | ORAL | 0 refills | Status: DC

## 2021-01-12 MED ORDER — GABAPENTIN 300 MG PO CAPS: 300.0000 mg | ORAL_CAPSULE | Freq: Three times a day (TID) | ORAL | 0 refills | Status: DC

## 2021-01-12 MED ORDER — BUSPIRONE HCL 10 MG PO TABS: 10.0000 mg | ORAL_TABLET | Freq: Three times a day (TID) | ORAL | 0 refills | Status: DC

## 2021-01-12 NOTE — Progress Notes (Signed)
Discharge Note:  Patient discharged with mother to home.   Denied SI and HI.  Denied A/V hallucinations.  Suicide prevention information given and discussed with patient who stated he understood and had no questions.  Patient stated he received all his belongings.   Patient stated he appreciated all assistance received from [facility] staff.  All required discharge information given.

## 2021-01-12 NOTE — Progress Notes (Signed)
Recreation Therapy Notes  Date:  6.27.22 Time: 0930  Location: [address] Dayroom  Group Topic: Stress Management  Goal Area(s) Addresses:  Patient will identify positive stress management techniques. Patient will identify benefits of using stress management post d/c.  Behavioral Response: Attentive  Intervention: Stress Management  Activity : Meditation.  LRT played a meditation that focused on being resilient when faced with adversity.     Education:  Stress Management, Discharge Planning.   Education Outcome: Acknowledges Education  Clinical Observations/Feedback: Pt attended and participated.  Pt had no questions and expressed no concerns.    Victorino Sparrow, LRT/CTRS      Victorino Sparrow A 01/12/2021 12:15 PM

## 2021-01-12 NOTE — BHH Suicide Risk Assessment (Addendum)
[facility] Discharge Suicide Risk Assessment   Principal Problem: Bipolar disorder, curr episode depressed, severe, w/psychotic features [facility]) Discharge Diagnoses: Principal Problem:   Bipolar disorder, curr episode depressed, severe, w/psychotic features ([place]) Active Problems:   HYPERCHOLESTEROLEMIA   Essential hypertension   Alcohol abuse   Seizure disorder (HCC)   Thrombocytopenia ([place])   Cocaine abuse ([place])   Hyponatremia   Hypomagnesemia  Subjective: Patient seen on rounds. He slept 4.75 hours. CIWA scores 2,4,3, 4. On assessment today he denies cravings for alcohol or cocaine or current signs of withdrawal. Abstinence from alcohol and illicit substances was encouraged after discharge. He denies SI, HI, AVH, paranoia, or delusions. He denies feelings of mania and states his sleep is improved. He describes stable mood and since of optimism today. He reports good appetite and voices no physical complaints. He can articulate a safety and aftercare plan and will stay with his mother after he leaves. He was advised to have a primary care provider recheck his EKG given his recent PVCs, monitor his electrolytes, manage his HTN, seizures, and high cholesterol, and monitor his metabolic panels while on an atypical antipsychotic. Time was given for questions.   EKG Repeat 01/11/21: No PVCs, NSR 97bpm with QTC 449ms Labs 01/12/21: Mag+ 2.1, Na+ 136 and BMP WNL except for Gap 4  Total Time Spent in Direct Patient Care:  I personally spent 30 minutes on the unit in direct patient care. The direct patient care time included face-to-face time with the patient, reviewing the patient's chart, communicating with other professionals, and coordinating care. Greater than 50% of this time was spent in counseling or coordinating care with the patient regarding goals of hospitalization, psycho-education, and discharge planning needs.  Physical Exam: Physical Exam Vitals reviewed.  HENT:     Head: Normocephalic.   Pulmonary:     Effort: Pulmonary effort is normal.  Neurological:     General: No focal deficit present.     Mental Status: He is alert.   Review of Systems  Respiratory:  Negative for shortness of breath.   Cardiovascular:  Negative for chest pain.  Gastrointestinal:  Negative for diarrhea, nausea and vomiting.  Blood pressure 108/85, pulse (!) 102, temperature 98.2 F (36.8 C), temperature source Oral, resp. rate 16, height 5\' 6"  (1.676 m), weight 71.2 kg, SpO2 99 %. Body mass index is 25.34 kg/m.  Musculoskeletal: Strength & Muscle Tone: within normal limits Gait & Station: normal, steady Patient leans: N/A   Psychiatric Specialty Exam: General Appearance:  casually dressed, good hygiene  Eye Contact:  Good  Speech:  Clear and Coherent and Normal Rate  Volume:  Normal  Mood:  described as improved - appears more euthymic  Affect:  brighter appearing, calm, polite  Thought Process:  Goal Directed and Linear  Orientation:  Full (Time, Place, and Person)  Thought Content:  denies current AVH, paranoia, ideas of reference or first rank symptoms - no acute psychosis on exam  Suicidal Thoughts:   denied  Homicidal Thoughts:  No  Memory:  Recent - good  Judgement:  Fair  Insight:  Fair  Psychomotor Activity:  Normal, no tremor  Concentration:  Concentration: Good and Attention Span: Good  Recall:  [place] of Knowledge:  Good  Language:  Good  Akathisia:  Negative  Assets:  Communication Skills Desire for Improvement Resilience  ADL's:  Intact  Cognition:  WNL    Mental Status Per Nursing Assessment::   On Admission:  Suicidal ideation indicated by patient  Demographic Factors:  Male and Abner Greenspan, lesbian, or bisexual orientation  Loss Factors: Loss of significant relationship and Financial problems/change in socioeconomic status  Historical Factors: Prior suicide attempts; family mental health history; substance use prior to admission  Risk Reduction Factors:    Sense of responsibility to family, Living with another person, especially a relative, and Positive social support  Continued Clinical Symptoms:  Bipolar Disorder:   Depressive phase  Cognitive Features That Contribute To Risk:  None    Suicide Risk:  Mild:  No current SI, intent or plan but previous attempts   [name]. Go to.   Specialty: Behavioral Health Why: Please go to this provider for therapy and medication management services during walk in hours:  Monday through Wednesday, from 8:00 am to 11:00 am.  Services are provided on a first come, first served basis. Contact information: [place] [phone]        ADS Follow up.   Why: Please go to this provider for substance abuse intensive outpatient therapy services.  Walk in hours are:  Monday through Friday, from 7:00 am to 10:30 am. Contact information: [place], [place] [address]  P: [phone] F: [place]. Schedule an appointment as soon as possible for a visit.   Why: Please call to schedule an appointment to establish care with this provider for primary care services, low cost pharmacy services and financial counseling services. Contact information: [place] [address] [phone]                Plan Of Care/Follow-up recommendations:  Activity:  as tolerated Diet:  heart healthy Other:  Patient advised to keep scheduled outpatient mental health and substance abuse treatment appointments as scheduled and to comply with medications. He was told he will need ongoing monitoring of his glucose, lipids, weight, EKG, and CBC while on an atypical antipsychotic. He was advised to print GoodRx coupon for help paying for his medications. He was advised to abstain from alcohol and illicit substances and to see a  primary care provider without fail for recheck of EKG given his recent premature ventricular contractions noted on EKGs during admission. He was also advised to have a primary care provider recheck his sodium, Magnesium and platelets and to manage his high blood pressure, manage his seizures, and manage his elevated cholesterol without fail.    Harlow Asa, MD, FAPA 01/12/2021, 7:14 AM

## 2021-01-12 NOTE — BHH Group Notes (Signed)
ADULT GRIEF GROUP NOTE:   Spiritual care group on grief and loss facilitated by chaplain Janne Napoleon, [facility]   Group Goal:   Support / Education around grief and loss   Members engage in facilitated group support and psycho-social education.   Group Description:   Following introductions and group rules, group members engaged in facilitated group dialog and support around topic of loss, with particular support around experiences of loss in their lives. Group Identified types of loss (relationships / self / things) and identified patterns, circumstances, and changes that precipitate losses. Reflected on thoughts / feelings around loss, normalized grief responses, and recognized variety in grief experience. Group noted Worden's four tasks of grief in discussion.   Group drew on Adlerian / Rogerian, narrative, MI,   Patient Progress: [name] came in partway through group but was actively engaged in the conversation.  He added thoughtful comments and was a good listener to his peers as they shared.  Chaplain Janne Napoleon, [place] Pager, [phone] 12:32 PM

## 2021-01-12 NOTE — Plan of Care (Signed)
Discuss coping skills with patient.

## 2021-01-12 NOTE — Progress Notes (Signed)
  [facility] Adult Case Management Discharge Plan :  Will you be returning to the same living situation after discharge:  No.mother's home At discharge, do you have transportation home?: Yes,  mother Do you have the ability to pay for your medications: Yes,  family support  Release of information consent forms completed and in the chart;  [name] signature needed at discharge.  Patient to Follow up at:  [place]. Go to.   Specialty: Behavioral Health Why: Please go to this provider for therapy and medication management services during walk in hours:  Monday through Wednesday, from 8:00 am to 11:00 am.  Services are provided on a first come, first served basis. Contact information: [place] [phone]        ADS Follow up.   Why: Please go to this provider for substance abuse intensive outpatient therapy services.  Walk in hours are:  Monday through Friday, from 7:00 am to 10:30 am. Contact information: [place], [place] [address]  P: [phone] F: [place]. Schedule an appointment as soon as possible for a visit.   Why: Please call to schedule an appointment to establish care with this provider for primary care services, low cost pharmacy services and financial counseling services. Contact information: [place] [address] [place] Follow up.   Why: Per your provider, please follow up at this facility concerning your labs. Contact information: [address] [place], [place] [address] [phone]                Next level of care provider has access to [place] and Suicide Prevention discussed: Yes,  mother  Have you used any form of tobacco in the last 30 days? (Cigarettes, Smokeless Tobacco,  Cigars, and/or Pipes): Yes  Has patient been referred to the Quitline?: Patient refused referral  Patient has been referred for addiction treatment: Yes  [name], [place] 01/12/2021, 9:55 AM
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
## 2021-01-12 NOTE — Progress Notes (Signed)
Pt did not attend goals group. 

## 2021-01-12 NOTE — Progress Notes (Signed)
D:  [name] self inventory sheet, patient  sleeps good, sleep medication helpful.  Good appetite, normal energy level, good concentration.  Denied depression and hopeless, anxiety 2.  Withdrawals, none.  Denied SI.  Physical problems, shoulder pain, worst pain #6 in past 24 hours.  Has taken pain medicine.  Goal is discharge.   Does have discharge plans. A:  Medications administered per MD orders.  Emotional support and encouragement given patient. R:  Denied SI and HI, contracts for safety.  Denied A/V hallucinations.  Safety maintained with 15 minute checks.

## 2021-01-20 ENCOUNTER — Encounter: Payer: Self-pay | Admitting: *Deleted

## 2021-02-08 ENCOUNTER — Other Ambulatory Visit: Payer: Self-pay

## 2021-02-08 ENCOUNTER — Emergency Department (HOSPITAL_COMMUNITY)
Admission: EM | Admit: 2021-02-08 | Discharge: 2021-02-10 | Disposition: A | Discharge status: Home / Self Care | Payer: Self-pay | Attending: Emergency Medicine | Admitting: Emergency Medicine

## 2021-02-08 ENCOUNTER — Encounter (HOSPITAL_COMMUNITY): Payer: Self-pay

## 2021-02-08 DIAGNOSIS — I2510 Atherosclerotic heart disease of native coronary artery without angina pectoris: Secondary | ICD-10-CM | POA: Insufficient documentation

## 2021-02-08 DIAGNOSIS — Z853 Personal history of malignant neoplasm of breast: Secondary | ICD-10-CM | POA: Insufficient documentation

## 2021-02-08 DIAGNOSIS — Z9013 Acquired absence of bilateral breasts and nipples: Secondary | ICD-10-CM | POA: Insufficient documentation

## 2021-02-08 DIAGNOSIS — I119 Hypertensive heart disease without heart failure: Secondary | ICD-10-CM | POA: Insufficient documentation

## 2021-02-08 DIAGNOSIS — Z85528 Personal history of other malignant neoplasm of kidney: Secondary | ICD-10-CM | POA: Insufficient documentation

## 2021-02-08 DIAGNOSIS — Z8616 Personal history of COVID-19: Secondary | ICD-10-CM | POA: Insufficient documentation

## 2021-02-08 DIAGNOSIS — R569 Unspecified convulsions: Secondary | ICD-10-CM | POA: Insufficient documentation

## 2021-02-08 DIAGNOSIS — R45851 Suicidal ideations: Secondary | ICD-10-CM | POA: Insufficient documentation

## 2021-02-08 DIAGNOSIS — F1994 Other psychoactive substance use, unspecified with psychoactive substance-induced mood disorder: Secondary | ICD-10-CM | POA: Diagnosis present

## 2021-02-08 DIAGNOSIS — R519 Headache, unspecified: Secondary | ICD-10-CM | POA: Insufficient documentation

## 2021-02-08 DIAGNOSIS — R1013 Epigastric pain: Secondary | ICD-10-CM | POA: Insufficient documentation

## 2021-02-08 DIAGNOSIS — F17210 Nicotine dependence, cigarettes, uncomplicated: Secondary | ICD-10-CM | POA: Insufficient documentation

## 2021-02-08 DIAGNOSIS — Z79899 Other long term (current) drug therapy: Secondary | ICD-10-CM | POA: Insufficient documentation

## 2021-02-08 DIAGNOSIS — Z20822 Contact with and (suspected) exposure to covid-19: Secondary | ICD-10-CM | POA: Insufficient documentation

## 2021-02-08 NOTE — ED Triage Notes (Signed)
Pt tried to stop drinking alcohol a day ago. His friends saw him have a seizure for about 10 minutes off and on. Pt is A&O x 4 currently.

## 2021-02-09 ENCOUNTER — Emergency Department (HOSPITAL_COMMUNITY): Payer: Self-pay

## 2021-02-09 LAB — URINALYSIS, ROUTINE W REFLEX MICROSCOPIC
Bacteria, UA: NONE SEEN
Bilirubin Urine: NEGATIVE
Glucose, UA: NEGATIVE mg/dL
Hgb urine dipstick: NEGATIVE
Ketones, ur: 5 mg/dL — AB
Leukocytes,Ua: NEGATIVE
Nitrite: NEGATIVE
Protein, ur: 100 mg/dL — AB
Specific Gravity, Urine: 1.017 (ref 1.005–1.030)
pH: 5 (ref 5.0–8.0)

## 2021-02-09 LAB — CBC
HCT: 43.9 % (ref 39.0–52.0)
Hemoglobin: 15.3 g/dL (ref 13.0–17.0)
MCH: 30 pg (ref 26.0–34.0)
MCHC: 34.9 g/dL (ref 30.0–36.0)
MCV: 86.1 fL (ref 80.0–100.0)
Platelets: 87 10*3/uL — ABNORMAL LOW (ref 150–400)
RBC: 5.1 MIL/uL (ref 4.22–5.81)
RDW: 15.5 % (ref 11.5–15.5)
WBC: 4.2 10*3/uL (ref 4.0–10.5)
nRBC: 0 % (ref 0.0–0.2)

## 2021-02-09 LAB — RAPID URINE DRUG SCREEN, HOSP PERFORMED
Amphetamines: NOT DETECTED
Barbiturates: NOT DETECTED
Benzodiazepines: POSITIVE — AB
Cocaine: POSITIVE — AB
Opiates: NOT DETECTED
Tetrahydrocannabinol: NOT DETECTED

## 2021-02-09 LAB — RESP PANEL BY RT-PCR (FLU A&B, COVID) ARPGX2
Influenza A by PCR: NEGATIVE
Influenza B by PCR: NEGATIVE
SARS Coronavirus 2 by RT PCR: NEGATIVE

## 2021-02-09 LAB — LACTIC ACID, PLASMA
Lactic Acid, Venous: 4.6 mmol/L (ref 0.5–1.9)
Lactic Acid, Venous: 1.4 mmol/L (ref 0.5–1.9)

## 2021-02-09 LAB — COMPREHENSIVE METABOLIC PANEL
ALT: 114 U/L — ABNORMAL HIGH (ref 0–44)
AST: 200 U/L — ABNORMAL HIGH (ref 15–41)
Albumin: 4.5 g/dL (ref 3.5–5.0)
Alkaline Phosphatase: 67 U/L (ref 38–126)
Anion gap: 17 — ABNORMAL HIGH (ref 5–15)
BUN: 6 mg/dL (ref 6–20)
CO2: 21 mmol/L — ABNORMAL LOW (ref 22–32)
Calcium: 9.2 mg/dL (ref 8.9–10.3)
Chloride: 98 mmol/L (ref 98–111)
Creatinine, Ser: 0.81 mg/dL (ref 0.61–1.24)
GFR, Estimated: >60 mL/min (ref >60)
Glucose, Bld: 85 mg/dL (ref 70–99)
Potassium: 3.7 mmol/L (ref 3.5–5.1)
Sodium: 136 mmol/L (ref 135–145)
Total Bilirubin: 0.9 mg/dL (ref 0.3–1.2)
Total Protein: 7.9 g/dL (ref 6.5–8.1)

## 2021-02-09 LAB — LIPASE, BLOOD: Lipase: 29 U/L (ref 11–51)

## 2021-02-09 LAB — ETHANOL: Alcohol, Ethyl (B): 43 mg/dL — ABNORMAL HIGH (ref <10)

## 2021-02-09 MED ORDER — SODIUM CHLORIDE 0.9 % IV BOLUS
1000.0000 mL | Freq: Once | INTRAVENOUS | Status: AC
  Administered 2021-02-09: 1000 mL via INTRAVENOUS

## 2021-02-09 MED ORDER — GABAPENTIN 300 MG PO CAPS
300.0000 mg | ORAL_CAPSULE | Freq: Every day | ORAL | Status: DC
  Administered 2021-02-09: 300 mg via ORAL
  Filled 2021-02-09: qty 1

## 2021-02-09 MED ORDER — THIAMINE HCL 100 MG PO TABS
100.0000 mg | ORAL_TABLET | Freq: Every day | ORAL | Status: DC
  Administered 2021-02-09: 100 mg via ORAL
  Filled 2021-02-09: qty 1

## 2021-02-09 MED ORDER — ALUM & MAG HYDROXIDE-SIMETH 200-200-20 MG/5ML PO SUSP: 30.0000 mL | Freq: Four times a day (QID) | ORAL | Status: DC | PRN

## 2021-02-09 MED ORDER — LORAZEPAM 2 MG/ML IJ SOLN: 0.0000 mg | Freq: Two times a day (BID) | INTRAMUSCULAR | Status: DC

## 2021-02-09 MED ORDER — ARIPIPRAZOLE 10 MG PO TABS
10.0000 mg | ORAL_TABLET | Freq: Every day | ORAL | Status: DC
  Administered 2021-02-09: 10 mg via ORAL
  Filled 2021-02-09: qty 1

## 2021-02-09 MED ORDER — ROSUVASTATIN CALCIUM 10 MG PO TABS
10.0000 mg | ORAL_TABLET | Freq: Every day | ORAL | Status: DC
  Administered 2021-02-09: 10 mg via ORAL
  Filled 2021-02-09 (×2): qty 1

## 2021-02-09 MED ORDER — PANTOPRAZOLE SODIUM 40 MG PO TBEC
40.0000 mg | DELAYED_RELEASE_TABLET | Freq: Every day | ORAL | Status: DC
  Administered 2021-02-09: 40 mg via ORAL
  Filled 2021-02-09: qty 1

## 2021-02-09 MED ORDER — SERTRALINE HCL 50 MG PO TABS
25.0000 mg | ORAL_TABLET | Freq: Every day | ORAL | Status: DC
  Administered 2021-02-09: 25 mg via ORAL
  Filled 2021-02-09: qty 1

## 2021-02-09 MED ORDER — LORAZEPAM 1 MG PO TABS
0.0000 mg | ORAL_TABLET | Freq: Four times a day (QID) | ORAL | Status: DC
Start: 2021-02-09 — End: 2021-02-10
  Administered 2021-02-09 (×2): 1 mg via ORAL
  Administered 2021-02-09: 2 mg via ORAL
  Filled 2021-02-09 (×2): qty 2
  Filled 2021-02-09: qty 1

## 2021-02-09 MED ORDER — DIPHENHYDRAMINE HCL 50 MG/ML IJ SOLN
25.0000 mg | Freq: Once | INTRAMUSCULAR | Status: AC
  Administered 2021-02-09: 25 mg via INTRAVENOUS
  Filled 2021-02-09: qty 1

## 2021-02-09 MED ORDER — GABAPENTIN 300 MG PO CAPS
300.0000 mg | ORAL_CAPSULE | Freq: Three times a day (TID) | ORAL | Status: DC
  Administered 2021-02-09 (×3): 300 mg via ORAL
  Filled 2021-02-09 (×3): qty 1

## 2021-02-09 MED ORDER — LORAZEPAM 2 MG/ML IJ SOLN: 2.0000 mg | Freq: Once | INTRAMUSCULAR | Status: AC

## 2021-02-09 MED ORDER — AMLODIPINE BESYLATE 5 MG PO TABS
10.0000 mg | ORAL_TABLET | Freq: Every day | ORAL | Status: DC
Start: 2021-02-09 — End: 2021-02-10
  Administered 2021-02-09: 10 mg via ORAL
  Filled 2021-02-09: qty 2

## 2021-02-09 MED ORDER — ONDANSETRON HCL 4 MG PO TABS: 4.0000 mg | ORAL_TABLET | Freq: Three times a day (TID) | ORAL | Status: DC | PRN

## 2021-02-09 MED ORDER — LAMOTRIGINE 25 MG PO TABS
25.0000 mg | ORAL_TABLET | Freq: Two times a day (BID) | ORAL | Status: DC
  Administered 2021-02-09 (×2): 25 mg via ORAL
  Filled 2021-02-09 (×2): qty 1

## 2021-02-09 MED ORDER — ONDANSETRON HCL 4 MG/2ML IJ SOLN
4.0000 mg | Freq: Once | INTRAMUSCULAR | Status: AC | PRN
  Administered 2021-02-09: 4 mg via INTRAVENOUS
  Filled 2021-02-09: qty 2

## 2021-02-09 MED ORDER — ACETAMINOPHEN 325 MG PO TABS
650.0000 mg | ORAL_TABLET | ORAL | Status: DC | PRN
  Administered 2021-02-10: 650 mg via ORAL
  Filled 2021-02-09: qty 2

## 2021-02-09 MED ORDER — LORAZEPAM 1 MG PO TABS: 0.0000 mg | ORAL_TABLET | Freq: Two times a day (BID) | ORAL | Status: DC

## 2021-02-09 MED ORDER — THIAMINE HCL 100 MG/ML IJ SOLN: 100.0000 mg | Freq: Every day | INTRAMUSCULAR | Status: DC

## 2021-02-09 MED ORDER — LORAZEPAM 2 MG/ML IJ SOLN: 0.0000 mg | Freq: Four times a day (QID) | INTRAMUSCULAR | Status: DC

## 2021-02-09 MED ORDER — BUSPIRONE HCL 10 MG PO TABS
10.0000 mg | ORAL_TABLET | Freq: Three times a day (TID) | ORAL | Status: DC
  Administered 2021-02-09 (×3): 10 mg via ORAL
  Filled 2021-02-09 (×3): qty 1

## 2021-02-09 MED ORDER — LORAZEPAM 2 MG/ML IJ SOLN
INTRAMUSCULAR | Status: AC
  Administered 2021-02-09: 2 mg via INTRAVENOUS
  Filled 2021-02-09: qty 2

## 2021-02-09 MED ORDER — TRAZODONE HCL 100 MG PO TABS: 100.0000 mg | ORAL_TABLET | Freq: Every evening | ORAL | Status: DC | PRN

## 2021-02-09 NOTE — ED Notes (Addendum)
Walked into room, patient was dry heaving stating he felt like he was going to vomit. Patient was given a vomit bag and cold wash cloth. Patient was sweating and hot.

## 2021-02-09 NOTE — ED Notes (Signed)
Patient was placed on bedside commode for bowel movement.

## 2021-02-09 NOTE — ED Notes (Signed)
Walked into room, patient was sitting on the bedside commode, shaking his loer extremities and drooling from his mouth. Patient was able to answer questions and tell us his name. Patient is now back in bed. MD was at bedside and verbal orders for ativan were given.

## 2021-02-09 NOTE — Discharge Instructions (Addendum)
Thank you for allowing me to care for you today in the Emergency Department.   You need to see neurology in the clinic.  Please use the information above to schedule an outpatient appointment.  Since you had a seizure today, you cannot drive for 6 months.  You will need to be cleared by neurology prior to being able to operate a motor vehicle.  Increase her home gabapentin to 300 mg in the morning, 300 mg in the afternoon, and 600 mg at night.  For your behavioral health needs you are advised to follow up with [facility] at your earliest opportunity:      [facility]      [place], [place] [address]      [phone]      They offer psychiatry/medication management, therapy, and substance abuse treatment.  New patients are being seen in their walk-in clinic.  Walk-in hours are Monday - Thursday from 8:00 am - 11:00 am for psychiatry, and Friday from 1:00 pm - 4:00 pm for therapy.  Walk-in patients are seen on a first come, first served basis, so try to arrive as early as possible for the best chance of being seen the same day.  Suicide prevention:  Call 911 Call suicide crisis hotline [phone] Call [facility] [phone] Return to nearest emergency department

## 2021-02-09 NOTE — ED Notes (Signed)
Patient was given a urinal to void.  

## 2021-02-09 NOTE — ED Notes (Signed)
Walked into room after patient requested to see his nurse. Patient states that he is having suicidal thoughts. Patient states he would ingest pills and has been drinking hand sanitizer. PA is aware.

## 2021-02-09 NOTE — ED Notes (Signed)
Patient complains of chest pain and headache after falling back and hitting  His head while having the seizure. He admits to ETOH and drug abuse prior to the seizure. Patient also states he is supposed to take Lamictal, but stop taking it due to alcohol use.

## 2021-02-09 NOTE — ED Notes (Signed)
Pt dressed in burgundy scrubs and wanded by security.  Belongings collected and placed in cabinets 16-18.  There was 1 t-shirt, 1 pair of blue pants and 1 pair of slides.
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
## 2021-02-09 NOTE — ED Notes (Signed)
Dr. Florina Ou verbal order for '2mg'$  Ativan.
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
## 2021-02-10 NOTE — ED Provider Notes (Signed)
  Physical Exam  BP (!) 137/97 (BP Location: Left Arm)   Pulse 84   Temp 98.9 F (37.2 C) (Oral)   Resp 16   Ht '5\' 6"'$  (1.676 m)   Wt 70.3 kg   SpO2 95%   BMI 25.02 kg/m   Physical Exam  ED Course/Procedures   Clinical Course as of 02/10/21 1150  Mon Feb 09, 2021  0200 RN assisted patient to bedside commode to have a bowel movement.  After assisting the patient with moving off the bed, he had an episode of shaking and jerking to his bilateral arms and legs.  During the time, the patient was able to move his head around, he was making good eye contact.  There was no drooling, tongue biting, or incontinence.  The episode lasted for approximately 2 to 3 minutes before resolving.  He was given 2 mg of Ativan.  The patient was seen and independently evaluated by Dr. Florina Ou, attending physician during the evidence.  Strong suspicion for pseudoseizure given patient's presentation during this episode. [MM]  0702 Unfortunately, after I discussed the plan for discharge home with outpatient neurology follow-up, changes to his home gabapentin regimen, and ER return precautions, the patient's RN reports that the patient is now actively stating that he is suicidal.  He also now states that he has been consuming hand sanitizer at home and is unsure if he has been taking extra doses of his home medications.  [MM]    Clinical Course User Index [MM] McDonald, Mia A, PA-C    Procedures  MDM  Patient is now been seen by psychiatry and cleared for discharge       Davonna Belling, MD 02/10/21 1150
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
## 2021-02-10 NOTE — ED Notes (Signed)
Pt given ginger ale and graham crackers.

## 2021-02-10 NOTE — BH Assessment (Signed)
[facility] Assessment Progress Note   Per Pecolia Ades, NP, this voluntary pt does not require psychiatric hospitalization at this time.  Pt is psychiatrically cleared.  Discharge instructions advise pt to follow up with [facility] at his earliest opportunity.  EDP Davonna Belling, MD and pt's nurse, Kandice Moos, have been notified.  Jalene Mullet, [place] Triage Specialist [phone]

## 2021-02-17 ENCOUNTER — Ambulatory Visit (HOSPITAL_COMMUNITY): Payer: Self-pay | Admitting: Licensed Clinical Social Worker

## 2021-02-19 ENCOUNTER — Other Ambulatory Visit: Payer: Self-pay

## 2021-02-19 ENCOUNTER — Emergency Department (HOSPITAL_COMMUNITY)
Admission: EM | Admit: 2021-02-19 | Discharge: 2021-02-19 | Disposition: A | Discharge status: Home / Self Care | Payer: Self-pay | Attending: Emergency Medicine | Admitting: Emergency Medicine

## 2021-02-19 ENCOUNTER — Encounter (HOSPITAL_COMMUNITY): Payer: Self-pay | Admitting: Emergency Medicine

## 2021-02-19 ENCOUNTER — Emergency Department (HOSPITAL_COMMUNITY): Payer: Self-pay

## 2021-02-19 ENCOUNTER — Inpatient Hospital Stay (HOSPITAL_COMMUNITY)
Admission: EM | Admit: 2021-02-19 | Discharge: 2021-03-04 | DRG: 917 | Disposition: A | Discharge status: Home Health | Payer: Self-pay | Attending: Internal Medicine | Admitting: Internal Medicine

## 2021-02-19 ENCOUNTER — Encounter (HOSPITAL_COMMUNITY): Payer: Self-pay

## 2021-02-19 ENCOUNTER — Ambulatory Visit (HOSPITAL_COMMUNITY): Admission: EM | Admit: 2021-02-19 | Discharge: 2021-02-19 | Discharge status: Against Medical Advice | Payer: Self-pay

## 2021-02-19 DIAGNOSIS — R109 Unspecified abdominal pain: Secondary | ICD-10-CM | POA: Insufficient documentation

## 2021-02-19 DIAGNOSIS — E162 Hypoglycemia, unspecified: Secondary | ICD-10-CM | POA: Diagnosis present

## 2021-02-19 DIAGNOSIS — F3130 Bipolar disorder, current episode depressed, mild or moderate severity, unspecified: Secondary | ICD-10-CM | POA: Diagnosis present

## 2021-02-19 DIAGNOSIS — J9690 Respiratory failure, unspecified, unspecified whether with hypoxia or hypercapnia: Secondary | ICD-10-CM

## 2021-02-19 DIAGNOSIS — Z09 Encounter for follow-up examination after completed treatment for conditions other than malignant neoplasm: Secondary | ICD-10-CM

## 2021-02-19 DIAGNOSIS — J690 Pneumonitis due to inhalation of food and vomit: Secondary | ICD-10-CM | POA: Diagnosis present

## 2021-02-19 DIAGNOSIS — Z01818 Encounter for other preprocedural examination: Secondary | ICD-10-CM

## 2021-02-19 DIAGNOSIS — Z83438 Family history of other disorder of lipoprotein metabolism and other lipidemia: Secondary | ICD-10-CM

## 2021-02-19 DIAGNOSIS — T1491XA Suicide attempt, initial encounter: Secondary | ICD-10-CM | POA: Diagnosis present

## 2021-02-19 DIAGNOSIS — R140 Abdominal distension (gaseous): Secondary | ICD-10-CM

## 2021-02-19 DIAGNOSIS — E872 Acidosis, unspecified: Secondary | ICD-10-CM | POA: Diagnosis present

## 2021-02-19 DIAGNOSIS — I2510 Atherosclerotic heart disease of native coronary artery without angina pectoris: Secondary | ICD-10-CM | POA: Diagnosis present

## 2021-02-19 DIAGNOSIS — F1210 Cannabis abuse, uncomplicated: Secondary | ICD-10-CM | POA: Diagnosis present

## 2021-02-19 DIAGNOSIS — R112 Nausea with vomiting, unspecified: Secondary | ICD-10-CM | POA: Insufficient documentation

## 2021-02-19 DIAGNOSIS — Z0189 Encounter for other specified special examinations: Secondary | ICD-10-CM

## 2021-02-19 DIAGNOSIS — Z28310 Unvaccinated for covid-19: Secondary | ICD-10-CM | POA: Insufficient documentation

## 2021-02-19 DIAGNOSIS — F10129 Alcohol abuse with intoxication, unspecified: Secondary | ICD-10-CM | POA: Diagnosis present

## 2021-02-19 DIAGNOSIS — Z452 Encounter for adjustment and management of vascular access device: Secondary | ICD-10-CM

## 2021-02-19 DIAGNOSIS — Z85528 Personal history of other malignant neoplasm of kidney: Secondary | ICD-10-CM

## 2021-02-19 DIAGNOSIS — Z9151 Personal history of suicidal behavior: Secondary | ICD-10-CM

## 2021-02-19 DIAGNOSIS — F17210 Nicotine dependence, cigarettes, uncomplicated: Secondary | ICD-10-CM | POA: Diagnosis present

## 2021-02-19 DIAGNOSIS — Z8249 Family history of ischemic heart disease and other diseases of the circulatory system: Secondary | ICD-10-CM

## 2021-02-19 DIAGNOSIS — J9600 Acute respiratory failure, unspecified whether with hypoxia or hypercapnia: Secondary | ICD-10-CM | POA: Diagnosis present

## 2021-02-19 DIAGNOSIS — T490X1A Poisoning by local antifungal, anti-infective and anti-inflammatory drugs, accidental (unintentional), initial encounter: Secondary | ICD-10-CM | POA: Diagnosis present

## 2021-02-19 DIAGNOSIS — Z905 Acquired absence of kidney: Secondary | ICD-10-CM

## 2021-02-19 DIAGNOSIS — Z5329 Procedure and treatment not carried out because of patient's decision for other reasons: Secondary | ICD-10-CM | POA: Diagnosis present

## 2021-02-19 DIAGNOSIS — N170 Acute kidney failure with tubular necrosis: Secondary | ICD-10-CM | POA: Diagnosis present

## 2021-02-19 DIAGNOSIS — Z853 Personal history of malignant neoplasm of breast: Secondary | ICD-10-CM

## 2021-02-19 DIAGNOSIS — F1410 Cocaine abuse, uncomplicated: Secondary | ICD-10-CM | POA: Diagnosis present

## 2021-02-19 DIAGNOSIS — Z6824 Body mass index (BMI) 24.0-24.9, adult: Secondary | ICD-10-CM

## 2021-02-19 DIAGNOSIS — R202 Paresthesia of skin: Secondary | ICD-10-CM | POA: Diagnosis present

## 2021-02-19 DIAGNOSIS — N179 Acute kidney failure, unspecified: Secondary | ICD-10-CM

## 2021-02-19 DIAGNOSIS — Z79899 Other long term (current) drug therapy: Secondary | ICD-10-CM

## 2021-02-19 DIAGNOSIS — Z9013 Acquired absence of bilateral breasts and nipples: Secondary | ICD-10-CM

## 2021-02-19 DIAGNOSIS — E871 Hypo-osmolality and hyponatremia: Secondary | ICD-10-CM | POA: Diagnosis present

## 2021-02-19 DIAGNOSIS — Z885 Allergy status to narcotic agent status: Secondary | ICD-10-CM

## 2021-02-19 DIAGNOSIS — F410 Panic disorder [episodic paroxysmal anxiety] without agoraphobia: Secondary | ICD-10-CM | POA: Diagnosis present

## 2021-02-19 DIAGNOSIS — F411 Generalized anxiety disorder: Secondary | ICD-10-CM | POA: Diagnosis present

## 2021-02-19 DIAGNOSIS — E874 Mixed disorder of acid-base balance: Secondary | ICD-10-CM | POA: Diagnosis present

## 2021-02-19 DIAGNOSIS — Z5321 Procedure and treatment not carried out due to patient leaving prior to being seen by health care provider: Secondary | ICD-10-CM | POA: Insufficient documentation

## 2021-02-19 DIAGNOSIS — J80 Acute respiratory distress syndrome: Secondary | ICD-10-CM

## 2021-02-19 DIAGNOSIS — Z789 Other specified health status: Secondary | ICD-10-CM

## 2021-02-19 DIAGNOSIS — Z4659 Encounter for fitting and adjustment of other gastrointestinal appliance and device: Secondary | ICD-10-CM

## 2021-02-19 DIAGNOSIS — F332 Major depressive disorder, recurrent severe without psychotic features: Secondary | ICD-10-CM | POA: Diagnosis present

## 2021-02-19 DIAGNOSIS — Z978 Presence of other specified devices: Secondary | ICD-10-CM

## 2021-02-19 DIAGNOSIS — R45851 Suicidal ideations: Secondary | ICD-10-CM | POA: Diagnosis present

## 2021-02-19 DIAGNOSIS — J9601 Acute respiratory failure with hypoxia: Secondary | ICD-10-CM

## 2021-02-19 DIAGNOSIS — E440 Moderate protein-calorie malnutrition: Secondary | ICD-10-CM | POA: Diagnosis present

## 2021-02-19 DIAGNOSIS — K567 Ileus, unspecified: Secondary | ICD-10-CM | POA: Diagnosis present

## 2021-02-19 DIAGNOSIS — T512X1A Toxic effect of 2-Propanol, accidental (unintentional), initial encounter: Principal | ICD-10-CM | POA: Diagnosis present

## 2021-02-19 DIAGNOSIS — Z809 Family history of malignant neoplasm, unspecified: Secondary | ICD-10-CM

## 2021-02-19 DIAGNOSIS — Z880 Allergy status to penicillin: Secondary | ICD-10-CM

## 2021-02-19 DIAGNOSIS — T528X1A Toxic effect of other organic solvents, accidental (unintentional), initial encounter: Secondary | ICD-10-CM | POA: Diagnosis present

## 2021-02-19 DIAGNOSIS — Z91018 Allergy to other foods: Secondary | ICD-10-CM

## 2021-02-19 DIAGNOSIS — Z823 Family history of stroke: Secondary | ICD-10-CM

## 2021-02-19 DIAGNOSIS — R0902 Hypoxemia: Secondary | ICD-10-CM

## 2021-02-19 DIAGNOSIS — Z20822 Contact with and (suspected) exposure to covid-19: Secondary | ICD-10-CM | POA: Diagnosis present

## 2021-02-19 DIAGNOSIS — E7800 Pure hypercholesterolemia, unspecified: Secondary | ICD-10-CM | POA: Diagnosis present

## 2021-02-19 DIAGNOSIS — I10 Essential (primary) hypertension: Secondary | ICD-10-CM | POA: Diagnosis present

## 2021-02-19 LAB — RAPID URINE DRUG SCREEN, HOSP PERFORMED
Amphetamines: NOT DETECTED
Barbiturates: NOT DETECTED
Benzodiazepines: POSITIVE — AB
Cocaine: POSITIVE — AB
Opiates: NOT DETECTED
Tetrahydrocannabinol: NOT DETECTED

## 2021-02-19 LAB — CBC
HCT: 39.2 % (ref 39.0–52.0)
Hemoglobin: 13.4 g/dL (ref 13.0–17.0)
MCH: 29.5 pg (ref 26.0–34.0)
MCHC: 34.2 g/dL (ref 30.0–36.0)
MCV: 86.3 fL (ref 80.0–100.0)
Platelets: 429 10*3/uL — ABNORMAL HIGH (ref 150–400)
RBC: 4.54 MIL/uL (ref 4.22–5.81)
RDW: 13.9 % (ref 11.5–15.5)
WBC: 11.1 10*3/uL — ABNORMAL HIGH (ref 4.0–10.5)
nRBC: 0 % (ref 0.0–0.2)

## 2021-02-19 LAB — RESP PANEL BY RT-PCR (FLU A&B, COVID) ARPGX2
Influenza A by PCR: NEGATIVE
Influenza B by PCR: NEGATIVE
SARS Coronavirus 2 by RT PCR: NEGATIVE

## 2021-02-19 LAB — COMPREHENSIVE METABOLIC PANEL
ALT: 88 U/L — ABNORMAL HIGH (ref 0–44)
AST: 74 U/L — ABNORMAL HIGH (ref 15–41)
Albumin: 3.6 g/dL (ref 3.5–5.0)
Alkaline Phosphatase: 45 U/L (ref 38–126)
Anion gap: 15 (ref 5–15)
BUN: 14 mg/dL (ref 6–20)
CO2: 19 mmol/L — ABNORMAL LOW (ref 22–32)
Calcium: 8.4 mg/dL — ABNORMAL LOW (ref 8.9–10.3)
Chloride: 102 mmol/L (ref 98–111)
Creatinine, Ser: 1.46 mg/dL — ABNORMAL HIGH (ref 0.61–1.24)
GFR, Estimated: >60 mL/min (ref >60)
Glucose, Bld: 155 mg/dL — ABNORMAL HIGH (ref 70–99)
Potassium: 3.2 mmol/L — ABNORMAL LOW (ref 3.5–5.1)
Sodium: 136 mmol/L (ref 135–145)
Total Bilirubin: 0.8 mg/dL (ref 0.3–1.2)
Total Protein: 6.6 g/dL (ref 6.5–8.1)

## 2021-02-19 LAB — TROPONIN I (HIGH SENSITIVITY): Troponin I (High Sensitivity): 24 ng/L — ABNORMAL HIGH (ref <18)

## 2021-02-19 LAB — URINALYSIS, ROUTINE W REFLEX MICROSCOPIC
Bilirubin Urine: NEGATIVE
Glucose, UA: NEGATIVE mg/dL
Hgb urine dipstick: NEGATIVE
Ketones, ur: 5 mg/dL — AB
Leukocytes,Ua: NEGATIVE
Nitrite: NEGATIVE
Protein, ur: 100 mg/dL — AB
Specific Gravity, Urine: 1.018 (ref 1.005–1.030)
pH: 5 (ref 5.0–8.0)

## 2021-02-19 LAB — ETHANOL: Alcohol, Ethyl (B): <10 mg/dL (ref <10)

## 2021-02-19 LAB — LIPASE, BLOOD: Lipase: 32 U/L (ref 11–51)

## 2021-02-19 MED ORDER — ACETAMINOPHEN 325 MG PO TABS
650.0000 mg | ORAL_TABLET | Freq: Once | ORAL | Status: DC
  Filled 2021-02-19: qty 2

## 2021-02-19 MED ORDER — ONDANSETRON 4 MG PO TBDP
4.0000 mg | ORAL_TABLET | Freq: Once | ORAL | Status: AC
  Administered 2021-02-19: 4 mg via ORAL
  Filled 2021-02-19: qty 1

## 2021-02-19 MED ORDER — METOCLOPRAMIDE HCL 5 MG/ML IJ SOLN
10.0000 mg | Freq: Once | INTRAMUSCULAR | Status: AC
  Administered 2021-02-19: 10 mg via INTRAMUSCULAR
  Filled 2021-02-19: qty 2

## 2021-02-19 MED ORDER — ONDANSETRON HCL 4 MG/2ML IJ SOLN
4.0000 mg | Freq: Once | INTRAMUSCULAR | Status: AC
Start: 2021-02-19 — End: 2021-02-19
  Administered 2021-02-19: 4 mg via INTRAVENOUS
  Filled 2021-02-19: qty 2

## 2021-02-19 MED ORDER — SODIUM CHLORIDE 0.9 % IV BOLUS
1000.0000 mL | Freq: Once | INTRAVENOUS | Status: AC
  Administered 2021-02-19: 1000 mL via INTRAVENOUS

## 2021-02-19 MED ORDER — ALUM & MAG HYDROXIDE-SIMETH 200-200-20 MG/5ML PO SUSP
30.0000 mL | Freq: Once | ORAL | Status: AC
  Administered 2021-02-19: 30 mL via ORAL
  Filled 2021-02-19: qty 30

## 2021-02-19 MED ORDER — ACETAMINOPHEN 325 MG PO TABS
650.0000 mg | ORAL_TABLET | Freq: Once | ORAL | Status: AC
  Administered 2021-02-19: 650 mg via ORAL
  Filled 2021-02-19: qty 2

## 2021-02-19 NOTE — ED Notes (Signed)
Called pt in waiting area 3 x no response.

## 2021-02-19 NOTE — BH Assessment (Signed)
Comprehensive Clinical Assessment (CCA) Screening, Triage and Referral Note  02/19/2021 Loyde Rzepecki [id number]  DISPOSITION: Per Margorie John, PA-C, pt is recommended for inpatient psychiatric admission.  No appropriate beds currently at [facility] per [place]. Daytime AC will review again if discharges. Advised RN Martinique [place] via [employer] and asked her to advise the EDP.   The patient demonstrates the following risk factors for suicide: Chronic risk factors for suicide include: psychiatric disorder of Schizophrenia, substance use disorder, and medical illness   . Acute risk factors for suicide include: unemployment and loss (financial, interpersonal, professional). Protective factors for this patient include: hope for the future. Considering these factors, the overall suicide risk at this point appears to be high. Patient is appropriate for outpatient follow up.  Flowsheet Row ED from 02/19/2021 in [place] Most recent reading at 02/19/2021 11:27 PM ED from 02/19/2021 in [facility] Most recent reading at 02/19/2021  9:29 AM ED from 02/08/2021 in [facility] DEPT Most recent reading at 02/09/2021  2:46 PM  C-SSRS RISK CATEGORY High Risk No Risk High Risk       Pt was brought in to [facility] via EMS voluntarily and unaccompanied after a suicide attempt by drinking 2 bottles of hand sanitizer. He had been at the [facility] earlier in the day but left AMA. Pt is vomiting and experiencing abdominal and chest pain. Pt has past hx of inpatient psychiatric hospitalization at [facility] in May 2022 and about 2 weeks ago following suicide attempts via OD. Pt denies HI, NSSH, access to firearms and AH. Pt reported VH and "seeing spots" but "nothing scary." Pt stated that he was prescribed medications after his last IP stay at [facility] and takes them "off and on" with the last dosage occurring about 2 days ago. Pt stated  he sees [place] at [facility] OP about every other month for therapy. Pt stated that he has been under increased stress recently because he is having to move from the boarding house where he has been living because it is shutting down because the owner is ill. Pt stated "Now, I have no place to go." Pt continues to use crack cocaine 1-2 times a week and tested + in the ED. Pt denies use of alcohol and THC for which he tested negative in the ED. Pt has a hx of medical problems related to substance use such as liver cirrhosis and seizures. He also has a hx of various cancers.  Patient was of average stature, weight and build with normal grooming and casual dress. Posture/gait, movement, concentration, and memory within normal limits. Normal attention and concentration and oriented to person, time, place and situation. Mood was depressed and affect was congruent with mood. Normal eye contact and responsive facial expressions. Patient was cooperative and forthcoming with information when asked. Speech, thought content and organization was within normal limits. Appeared to have average intelligence with poor judgment and insight but within normal limits for age.    Chief Complaint:  Chief Complaint  Patient presents with   Abdominal Pain   Chest Pain   Suicidal   Visit Diagnosis:  MDD, Recurrent, Severe Schizophrenia Stimulant Use dependence  Patient Reported Information How did you hear about [place]? Self  What Is the Reason for Your Visit/Call Today? Pt was brought in to [facility] via EMS voluntarily and unaccompanied after a suicide attempt by drinking 2 bottles of hand sanitizer. [facility] in [facility] but left AMA. Pt is vomiting and experiencing abdominal and chest pain. Pt has past hx of inpatient psychiatric hospitalization at [facility] in May 2022 and about 2 weeks ago following suicide attempts via OD. Pt denies HI, NSSH, access to firearms and AH. Pt reported VH and "seeing  spots" but "nothing scary." Pt stated that he was prescribed medications after his last IP stay at [facility] and takes them "off and on" with the last dosage occurring about 2 days ago. Pt stated he sees [place] at [facility] OP about every other month for therapy. Pt stated that he has been under increased stress recently because he is having to move from the boarding house where he has been living because it is shutting down because the owner is ill. Pt stated "Now, I have no place to go." Pt continues to use crack cocaine 1-2 times a week and tested + in the ED. Pt denies use of alcohol and THC for which he tested negative in the ED. Pt has a hx of medical problems related to substance use such as liver cirrhosis and seizures. He also has a hx of various cancers.  How Long Has This Been Causing You Problems? <Week  What Do You Feel Would Help You the Most Today? Treatment for Depression or other mood problem; Housing Assistance; Social Support   Have You Recently Had Any Thoughts About [place]? Yes  Are You Planning to Commit Suicide/Harm Yourself At This time? No   Have you Recently Had Thoughts About [place]? No  Are You Planning to Harm Someone at This Time? No  Explanation: No data recorded  Have You Used Any Alcohol or Drugs in the Past 24 Hours? Yes  How Long Ago Did You Use Drugs or Alcohol? No data recorded What Did You Use and How Much? crack cocaine   Do You Currently Have a Therapist/Psychiatrist? Yes  Name of Therapist/Psychiatrist: [facility] prescribed medications; therapist Burt Ek at [place] Recently Discharged From Any Office Practice or Programs? No  Explanation of Discharge From Practice/Program: No data recorded   CCA Screening Triage Referral Assessment Type of Contact: Tele-Assessment  Telemedicine Service Delivery:   Is this Initial or Reassessment? Initial Assessment  Date Telepsych consult ordered in [facility]:   02/19/21  Time Telepsych consult ordered in [facility]:  2113  Location of Assessment: [facility] ED  Provider Location: [facility]   Collateral Involvement: none   Does Patient Have a [place]? No data recorded Name and Contact of Legal Guardian: No data recorded If Minor and Not Living with Parent(s), Who has Custody? NA  Is CPS involved or ever been involved? -- (na)  Is APS involved or ever been involved? -- (na)   Patient Determined To Be At Risk for Harm To Self or Others Based on Review of Patient Reported Information or Presenting Complaint? Yes, for Self-Harm  Method: No data recorded Availability of Means: No data recorded Intent: No data recorded Notification Required: No data recorded Additional Information for Danger to Others Potential: No data recorded Additional Comments for Danger to Others Potential: No data recorded Are There Guns or Other Weapons in Your Home? No data recorded Types of Guns/Weapons: No data recorded Are These Weapons Safely Secured?                            No  data recorded Who Could Verify You Are Able To Have These Secured: No data recorded Do You Have any Outstanding Charges, Pending Court Dates, Parole/Probation? No data recorded Contacted To Inform of Risk of Harm To Self or Others: Other: Comment (NA)   Does Patient Present under Involuntary Commitment? No  IVC Papers Initial File Date: No data recorded  [place] of Residence: [place]   Patient Currently Receiving the Following Services: Individual Therapy   Determination of Need: Emergent (2 hours) (Per Margorie John, PA-C, pt is recommended for inpatient psychiatric admission.)   Options For Referral: Inpatient Hospitalization   Discharge Disposition:     Fuller Mandril, Counselor

## 2021-02-19 NOTE — ED Provider Notes (Signed)
Emergency Medicine Provider Triage Evaluation Note  [name] , a 40 y.o. male  was evaluated in triage.  Pt complains of nausea, vomiting, chest pain and suicidality.  Patient has been drinking heavily including drinking hand sanitizer he has been vomiting here in the emergency department patient was seen earlier but left due to "a family emergency."  Review of Systems  Positive: Suicidal, vomiting Negative: Breath  Physical Exam  BP (!) 104/52   Pulse (!) 58   Temp 98 F (36.7 C) (Oral)   Resp 14   Ht '5\' 6"'$  (1.676 m)   Wt 72.6 kg   SpO2 100%   BMI 25.82 kg/m  Gen:   Awake, no distress   Resp:  Normal effort  MSK:   Moves extremities without difficulty  Other:  vomiting  Medical Decision Making  Medically screening exam initiated at 4:44 PM.  Appropriate orders placed.  [name] was informed that the remainder of the evaluation will be completed by another provider, this initial triage assessment does not replace that evaluation, and the importance of remaining in the ED until their evaluation is complete.  Patient here with suicidality, nausea, vomiting and chest pain.  I believe his chest pain is secondary to vomiting and he has been drinking hand sanitizer.  He is given Zofran here in the emergency department.   Margarita Mail, PA-C 02/19/21 [place], [place], DO 02/19/21 2202

## 2021-02-19 NOTE — ED Provider Notes (Signed)
Emergency Medicine Provider Triage Evaluation Note  [name] , a 40 y.o. male  was evaluated in triage.  Pt complains of stomach pain, nausea, vomiting, diarrhea not feeling well.  Started this morning, states he has a history of alcohol abuse but denies drinking last night, he is vaccinating against COVID-19, denies recent sick contacts, has not gotten his booster vaccine.  States he see some hematemesis but denies coffee-ground emesis denies hematuria chest Melena..  Review of Systems  Positive: Nausea, vomiting Negative: Chest pain, shortness of breath  Physical Exam  BP 111/64   Pulse 92   Temp 97.9 F (36.6 C)   Resp 16   SpO2 96%  Gen:   Awake, no distress   Resp:  Normal effort  MSK:   Moves extremities without difficulty  Other:  No peritoneal sign, no rebound tenderness.  Medical Decision Making  Medically screening exam initiated at 9:38 AM.  Appropriate orders placed.  [name] was informed that the remainder of the evaluation will be completed by another provider, this initial triage assessment does not replace that evaluation, and the importance of remaining in the ED until their evaluation is complete.  Presents with nausea, vomiting, diarrhea patient will need further work-up.   Marcello Fennel, PA-C 02/19/21 0940    Lorelle Gibbs, DO 02/20/21 [id number]

## 2021-02-19 NOTE — ED Triage Notes (Signed)
Patient complains of burning abdominal pain and vomiting that started this morning. Patient alert, oriented, and in no apparent distress at this time.

## 2021-02-19 NOTE — ED Triage Notes (Signed)
Pt from home with ems c.o abd pain and chest pain. Pt seen here earlier and left AMA. Pt now reports SI, stating he drank 2 bottles of hand sanitizer to kill himself.

## 2021-02-19 NOTE — ED Notes (Signed)
Patient asked this tech how many people ahead of him, this tech explained to patient that we are not able to tell how long it would be before he got a room and that we see people based on acuity not time, patient stated his understanding. Patient walked outside and ignored this tech when asked if he was leaving.
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
## 2021-02-20 ENCOUNTER — Inpatient Hospital Stay (HOSPITAL_COMMUNITY): Payer: Self-pay

## 2021-02-20 DIAGNOSIS — N179 Acute kidney failure, unspecified: Secondary | ICD-10-CM

## 2021-02-20 DIAGNOSIS — T1491XA Suicide attempt, initial encounter: Secondary | ICD-10-CM | POA: Diagnosis present

## 2021-02-20 DIAGNOSIS — T528X2A Toxic effect of other organic solvents, intentional self-harm, initial encounter: Secondary | ICD-10-CM

## 2021-02-20 DIAGNOSIS — E872 Acidosis, unspecified: Secondary | ICD-10-CM | POA: Diagnosis present

## 2021-02-20 DIAGNOSIS — T528X1A Toxic effect of other organic solvents, accidental (unintentional), initial encounter: Secondary | ICD-10-CM | POA: Diagnosis present

## 2021-02-20 DIAGNOSIS — T490X1A Poisoning by local antifungal, anti-infective and anti-inflammatory drugs, accidental (unintentional), initial encounter: Secondary | ICD-10-CM | POA: Diagnosis present

## 2021-02-20 DIAGNOSIS — T490X2A Poisoning by local antifungal, anti-infective and anti-inflammatory drugs, intentional self-harm, initial encounter: Secondary | ICD-10-CM

## 2021-02-20 LAB — MAGNESIUM
Magnesium: 0.9 mg/dL — CL (ref 1.7–2.4)
Magnesium: 0.7 mg/dL — CL (ref 1.7–2.4)
Magnesium: 2.5 mg/dL — ABNORMAL HIGH (ref 1.7–2.4)

## 2021-02-20 LAB — I-STAT CHEM 8, ED
BUN: 24 mg/dL — ABNORMAL HIGH (ref 6–20)
Calcium, Ion: 0.89 mmol/L — CL (ref 1.15–1.40)
Chloride: 95 mmol/L — ABNORMAL LOW (ref 98–111)
Creatinine, Ser: 3.2 mg/dL — ABNORMAL HIGH (ref 0.61–1.24)
Glucose, Bld: 265 mg/dL — ABNORMAL HIGH (ref 70–99)
HCT: 40 % (ref 39.0–52.0)
Hemoglobin: 13.6 g/dL (ref 13.0–17.0)
Potassium: 4.2 mmol/L (ref 3.5–5.1)
Sodium: 126 mmol/L — ABNORMAL LOW (ref 135–145)
TCO2: 11 mmol/L — ABNORMAL LOW (ref 22–32)

## 2021-02-20 LAB — CBC WITH DIFFERENTIAL/PLATELET
Abs Immature Granulocytes: 0.12 10*3/uL — ABNORMAL HIGH (ref 0.00–0.07)
Basophils Absolute: 0 10*3/uL (ref 0.0–0.1)
Basophils Relative: 0 %
Eosinophils Absolute: 0 10*3/uL (ref 0.0–0.5)
Eosinophils Relative: 0 %
HCT: 37.1 % — ABNORMAL LOW (ref 39.0–52.0)
Hemoglobin: 12.7 g/dL — ABNORMAL LOW (ref 13.0–17.0)
Immature Granulocytes: 1 %
Lymphocytes Relative: 11 %
Lymphs Abs: 1.8 10*3/uL (ref 0.7–4.0)
MCH: 30 pg (ref 26.0–34.0)
MCHC: 34.2 g/dL (ref 30.0–36.0)
MCV: 87.5 fL (ref 80.0–100.0)
Monocytes Absolute: 1.1 10*3/uL — ABNORMAL HIGH (ref 0.1–1.0)
Monocytes Relative: 7 %
Neutro Abs: 12.9 10*3/uL — ABNORMAL HIGH (ref 1.7–7.7)
Neutrophils Relative %: 81 %
Platelets: 433 10*3/uL — ABNORMAL HIGH (ref 150–400)
RBC: 4.24 MIL/uL (ref 4.22–5.81)
RDW: 14 % (ref 11.5–15.5)
WBC: 15.9 10*3/uL — ABNORMAL HIGH (ref 4.0–10.5)
nRBC: 0 % (ref 0.0–0.2)

## 2021-02-20 LAB — I-STAT ARTERIAL BLOOD GAS, ED
Acid-base deficit: 11 mmol/L — ABNORMAL HIGH (ref 0.0–2.0)
Acid-base deficit: 10 mmol/L — ABNORMAL HIGH (ref 0.0–2.0)
Bicarbonate: 11.9 mmol/L — ABNORMAL LOW (ref 20.0–28.0)
Bicarbonate: 13.4 mmol/L — ABNORMAL LOW (ref 20.0–28.0)
Calcium, Ion: 0.86 mmol/L — CL (ref 1.15–1.40)
Calcium, Ion: 0.93 mmol/L — ABNORMAL LOW (ref 1.15–1.40)
HCT: 39 % (ref 39.0–52.0)
HCT: 40 % (ref 39.0–52.0)
Hemoglobin: 13.3 g/dL (ref 13.0–17.0)
Hemoglobin: 13.6 g/dL (ref 13.0–17.0)
O2 Saturation: 88 %
O2 Saturation: 90 %
Patient temperature: 98
Patient temperature: 97.4
Potassium: 4.3 mmol/L (ref 3.5–5.1)
Potassium: 5 mmol/L (ref 3.5–5.1)
Sodium: 124 mmol/L — ABNORMAL LOW (ref 135–145)
Sodium: 123 mmol/L — ABNORMAL LOW (ref 135–145)
TCO2: 12 mmol/L — ABNORMAL LOW (ref 22–32)
TCO2: 14 mmol/L — ABNORMAL LOW (ref 22–32)
pCO2 arterial: 19.4 mmHg — CL (ref 32.0–48.0)
pCO2 arterial: 24.1 mmHg — ABNORMAL LOW (ref 32.0–48.0)
pH, Arterial: 7.394 (ref 7.350–7.450)
pH, Arterial: 7.351 (ref 7.350–7.450)
pO2, Arterial: 52 mmHg — ABNORMAL LOW (ref 83.0–108.0)
pO2, Arterial: 57 mmHg — ABNORMAL LOW (ref 83.0–108.0)

## 2021-02-20 LAB — CBC
HCT: 38.2 % — ABNORMAL LOW (ref 39.0–52.0)
Hemoglobin: 13.5 g/dL (ref 13.0–17.0)
MCH: 30 pg (ref 26.0–34.0)
MCHC: 35.3 g/dL (ref 30.0–36.0)
MCV: 84.9 fL (ref 80.0–100.0)
Platelets: 465 10*3/uL — ABNORMAL HIGH (ref 150–400)
RBC: 4.5 MIL/uL (ref 4.22–5.81)
RDW: 13.5 % (ref 11.5–15.5)
WBC: 9.3 10*3/uL (ref 4.0–10.5)
nRBC: 0.2 % (ref 0.0–0.2)

## 2021-02-20 LAB — MRSA NEXT GEN BY PCR, NASAL: MRSA by PCR Next Gen: NOT DETECTED

## 2021-02-20 LAB — BASIC METABOLIC PANEL
Anion gap: 16 — ABNORMAL HIGH (ref 5–15)
Anion gap: 16 — ABNORMAL HIGH (ref 5–15)
BUN: 33 mg/dL — ABNORMAL HIGH (ref 6–20)
BUN: 41 mg/dL — ABNORMAL HIGH (ref 6–20)
CO2: 26 mmol/L (ref 22–32)
CO2: 19 mmol/L — ABNORMAL LOW (ref 22–32)
Calcium: 5.7 mg/dL — CL (ref 8.9–10.3)
Calcium: 7.1 mg/dL — ABNORMAL LOW (ref 8.9–10.3)
Chloride: 79 mmol/L — ABNORMAL LOW (ref 98–111)
Chloride: 86 mmol/L — ABNORMAL LOW (ref 98–111)
Creatinine, Ser: 3.69 mg/dL — ABNORMAL HIGH (ref 0.61–1.24)
Creatinine, Ser: 4.42 mg/dL — ABNORMAL HIGH (ref 0.61–1.24)
GFR, Estimated: 20 mL/min — ABNORMAL LOW (ref >60)
GFR, Estimated: 16 mL/min — ABNORMAL LOW (ref >60)
Glucose, Bld: 684 mg/dL (ref 70–99)
Glucose, Bld: 245 mg/dL — ABNORMAL HIGH (ref 70–99)
Potassium: 4 mmol/L (ref 3.5–5.1)
Potassium: 3.9 mmol/L (ref 3.5–5.1)
Sodium: 121 mmol/L — ABNORMAL LOW (ref 135–145)
Sodium: 121 mmol/L — ABNORMAL LOW (ref 135–145)

## 2021-02-20 LAB — COMPREHENSIVE METABOLIC PANEL
ALT: 84 U/L — ABNORMAL HIGH (ref 0–44)
ALT: 77 U/L — ABNORMAL HIGH (ref 0–44)
AST: 66 U/L — ABNORMAL HIGH (ref 15–41)
AST: 50 U/L — ABNORMAL HIGH (ref 15–41)
Albumin: 3.4 g/dL — ABNORMAL LOW (ref 3.5–5.0)
Albumin: 3.3 g/dL — ABNORMAL LOW (ref 3.5–5.0)
Alkaline Phosphatase: 40 U/L (ref 38–126)
Alkaline Phosphatase: 38 U/L (ref 38–126)
Anion gap: 25 — ABNORMAL HIGH (ref 5–15)
Anion gap: 19 — ABNORMAL HIGH (ref 5–15)
BUN: 25 mg/dL — ABNORMAL HIGH (ref 6–20)
BUN: 34 mg/dL — ABNORMAL HIGH (ref 6–20)
CO2: 8 mmol/L — ABNORMAL LOW (ref 22–32)
CO2: 13 mmol/L — ABNORMAL LOW (ref 22–32)
Calcium: 7.7 mg/dL — ABNORMAL LOW (ref 8.9–10.3)
Calcium: 7.3 mg/dL — ABNORMAL LOW (ref 8.9–10.3)
Chloride: 93 mmol/L — ABNORMAL LOW (ref 98–111)
Chloride: 93 mmol/L — ABNORMAL LOW (ref 98–111)
Creatinine, Ser: 3.33 mg/dL — ABNORMAL HIGH (ref 0.61–1.24)
Creatinine, Ser: 3.92 mg/dL — ABNORMAL HIGH (ref 0.61–1.24)
GFR, Estimated: 23 mL/min — ABNORMAL LOW (ref >60)
GFR, Estimated: 19 mL/min — ABNORMAL LOW (ref >60)
Glucose, Bld: 262 mg/dL — ABNORMAL HIGH (ref 70–99)
Glucose, Bld: 136 mg/dL — ABNORMAL HIGH (ref 70–99)
Potassium: 4.1 mmol/L (ref 3.5–5.1)
Potassium: 4.7 mmol/L (ref 3.5–5.1)
Sodium: 126 mmol/L — ABNORMAL LOW (ref 135–145)
Sodium: 125 mmol/L — ABNORMAL LOW (ref 135–145)
Total Bilirubin: 0.6 mg/dL (ref 0.3–1.2)
Total Bilirubin: 0.5 mg/dL (ref 0.3–1.2)
Total Protein: 6.2 g/dL — ABNORMAL LOW (ref 6.5–8.1)
Total Protein: 6.1 g/dL — ABNORMAL LOW (ref 6.5–8.1)

## 2021-02-20 LAB — ACETAMINOPHEN LEVEL: Acetaminophen (Tylenol), Serum: <10 ug/mL — ABNORMAL LOW (ref 10–30)

## 2021-02-20 LAB — ETHYLENE GLYCOL
Ethylene Glycol Lvl: <5 mg/dL
Ethylene Glycol Lvl: <5 mg/dL

## 2021-02-20 LAB — PHOSPHORUS
Phosphorus: 5.3 mg/dL — ABNORMAL HIGH (ref 2.5–4.6)
Phosphorus: 4.7 mg/dL — ABNORMAL HIGH (ref 2.5–4.6)

## 2021-02-20 LAB — LACTIC ACID, PLASMA: Lactic Acid, Venous: 3.4 mmol/L (ref 0.5–1.9)

## 2021-02-20 LAB — CORTISOL: Cortisol, Plasma: 72.7 ug/dL

## 2021-02-20 LAB — GLUCOSE, CAPILLARY
Glucose-Capillary: 181 mg/dL — ABNORMAL HIGH (ref 70–99)
Glucose-Capillary: 174 mg/dL — ABNORMAL HIGH (ref 70–99)
Glucose-Capillary: 183 mg/dL — ABNORMAL HIGH (ref 70–99)
Glucose-Capillary: 142 mg/dL — ABNORMAL HIGH (ref 70–99)

## 2021-02-20 LAB — PROTIME-INR
INR: 1.2 (ref 0.8–1.2)
Prothrombin Time: 15.6 s — ABNORMAL HIGH (ref 11.4–15.2)

## 2021-02-20 LAB — PROCALCITONIN: Procalcitonin: 2.08 ng/mL

## 2021-02-20 LAB — VOLATILES,BLD-ACETONE,ETHANOL,ISOPROP,METHANOL
Acetone, blood: 0.026 g/dL — ABNORMAL HIGH (ref 0.000–0.010)
Ethanol, blood: <0.01 g/dL (ref 0.000–0.010)
Isopropanol, blood: <0.01 g/dL (ref 0.000–0.010)
Methanol, blood: <0.01 g/dL (ref 0.000–0.010)

## 2021-02-20 LAB — OSMOLALITY: Osmolality: 296 mOsm/kg — ABNORMAL HIGH (ref 275–295)

## 2021-02-20 LAB — TROPONIN I (HIGH SENSITIVITY): Troponin I (High Sensitivity): 39 ng/L — ABNORMAL HIGH (ref <18)

## 2021-02-20 LAB — SALICYLATE LEVEL: Salicylate Lvl: <7 mg/dL — ABNORMAL LOW (ref 7.0–30.0)

## 2021-02-20 LAB — AMYLASE: Amylase: 40 U/L (ref 28–100)

## 2021-02-20 LAB — LIPASE, BLOOD: Lipase: 33 U/L (ref 11–51)

## 2021-02-20 LAB — STREP PNEUMONIAE URINARY ANTIGEN: Strep Pneumo Urinary Antigen: NEGATIVE

## 2021-02-20 MED ORDER — ADULT MULTIVITAMIN W/MINERALS CH
1.0000 | ORAL_TABLET | Freq: Every day | ORAL | Status: DC
  Administered 2021-02-20: 1 via ORAL
  Filled 2021-02-20: qty 1

## 2021-02-20 MED ORDER — PANTOPRAZOLE SODIUM 40 MG IV SOLR
40.0000 mg | Freq: Every day | INTRAVENOUS | Status: DC
Start: 2021-02-20 — End: 2021-02-23
  Administered 2021-02-20 – 2021-02-22 (×3): 40 mg via INTRAVENOUS
  Filled 2021-02-20 (×3): qty 40

## 2021-02-20 MED ORDER — FOLIC ACID 1 MG PO TABS
1.0000 mg | ORAL_TABLET | Freq: Every day | ORAL | Status: DC
  Administered 2021-02-20: 1 mg via ORAL
  Filled 2021-02-20: qty 1

## 2021-02-20 MED ORDER — HEPARIN SODIUM (PORCINE) 5000 UNIT/ML IJ SOLN
5000.0000 [IU] | Freq: Three times a day (TID) | INTRAMUSCULAR | Status: DC
  Administered 2021-02-21 – 2021-03-03 (×31): 5000 [IU] via SUBCUTANEOUS
  Filled 2021-02-20 (×30): qty 1

## 2021-02-20 MED ORDER — LACTATED RINGERS IV BOLUS
1000.0000 mL | Freq: Once | INTRAVENOUS | Status: AC
  Administered 2021-02-20: 1000 mL via INTRAVENOUS

## 2021-02-20 MED ORDER — THIAMINE HCL 100 MG/ML IJ SOLN: 100.0000 mg | Freq: Every day | INTRAMUSCULAR | Status: DC

## 2021-02-20 MED ORDER — ENOXAPARIN SODIUM 30 MG/0.3ML IJ SOSY
30.0000 mg | PREFILLED_SYRINGE | INTRAMUSCULAR | Status: DC
  Administered 2021-02-20: 30 mg via SUBCUTANEOUS
  Filled 2021-02-20: qty 0.3

## 2021-02-20 MED ORDER — LORAZEPAM 2 MG/ML IJ SOLN
1.0000 mg | INTRAMUSCULAR | Status: DC | PRN
  Administered 2021-02-20 (×2): 2 mg via INTRAVENOUS
  Administered 2021-02-20 (×2): 1 mg via INTRAVENOUS
  Administered 2021-02-20: 2 mg via INTRAVENOUS
  Administered 2021-02-21: 3 mg via INTRAVENOUS
  Administered 2021-02-21: 2 mg via INTRAVENOUS
  Filled 2021-02-20 (×2): qty 1
  Filled 2021-02-20: qty 2
  Filled 2021-02-20 (×3): qty 1

## 2021-02-20 MED ORDER — ONDANSETRON HCL 4 MG/2ML IJ SOLN
4.0000 mg | Freq: Once | INTRAMUSCULAR | Status: AC
  Administered 2021-02-20: 4 mg via INTRAVENOUS
  Filled 2021-02-20: qty 2

## 2021-02-20 MED ORDER — SODIUM CHLORIDE 0.9 % IV SOLN
15.0000 mg/kg | Freq: Once | INTRAVENOUS | Status: AC
  Administered 2021-02-20: 1090 mg via INTRAVENOUS
  Filled 2021-02-20: qty 1.09

## 2021-02-20 MED ORDER — LORAZEPAM 2 MG/ML IJ SOLN
1.0000 mg | Freq: Once | INTRAMUSCULAR | Status: AC
  Administered 2021-02-20: 1 mg via INTRAVENOUS
  Filled 2021-02-20: qty 1

## 2021-02-20 MED ORDER — DOCUSATE SODIUM 100 MG PO CAPS: 100.0000 mg | ORAL_CAPSULE | Freq: Two times a day (BID) | ORAL | Status: DC | PRN

## 2021-02-20 MED ORDER — CHLORHEXIDINE GLUCONATE CLOTH 2 % EX PADS
6.0000 | MEDICATED_PAD | Freq: Every day | CUTANEOUS | Status: DC
Start: 2021-02-20 — End: 2021-02-26
  Administered 2021-02-20 – 2021-02-25 (×6): 6 via TOPICAL

## 2021-02-20 MED ORDER — LACTATED RINGERS IV SOLN: INTRAVENOUS | Status: DC

## 2021-02-20 MED ORDER — SODIUM BICARBONATE 8.4 % IV SOLN
INTRAVENOUS | Status: DC
  Filled 2021-02-20 (×3): qty 1000

## 2021-02-20 MED ORDER — THIAMINE HCL 100 MG PO TABS
100.0000 mg | ORAL_TABLET | Freq: Every day | ORAL | Status: DC
  Administered 2021-02-20: 100 mg via ORAL
  Filled 2021-02-20: qty 1

## 2021-02-20 MED ORDER — ONDANSETRON HCL 4 MG/2ML IJ SOLN
4.0000 mg | Freq: Four times a day (QID) | INTRAMUSCULAR | Status: DC | PRN
  Administered 2021-02-20: 4 mg via INTRAVENOUS
  Filled 2021-02-20: qty 2

## 2021-02-20 MED ORDER — LORAZEPAM 1 MG PO TABS
1.0000 mg | ORAL_TABLET | ORAL | Status: DC | PRN
  Filled 2021-02-20: qty 1

## 2021-02-20 MED ORDER — MAGNESIUM SULFATE 4 GM/100ML IV SOLN
4.0000 g | Freq: Once | INTRAVENOUS | Status: AC
  Administered 2021-02-20: 4 g via INTRAVENOUS
  Filled 2021-02-20 (×3): qty 100

## 2021-02-20 MED ORDER — SODIUM CHLORIDE 0.9 % IV BOLUS
1000.0000 mL | Freq: Once | INTRAVENOUS | Status: AC
  Administered 2021-02-20: 1000 mL via INTRAVENOUS

## 2021-02-20 MED ORDER — SODIUM CHLORIDE 0.9 % IV SOLN: INTRAVENOUS | Status: DC | PRN

## 2021-02-20 MED ORDER — POLYETHYLENE GLYCOL 3350 17 G PO PACK: 17.0000 g | PACK | Freq: Every day | ORAL | Status: DC | PRN

## 2021-02-20 MED ORDER — KETOROLAC TROMETHAMINE 30 MG/ML IJ SOLN
30.0000 mg | Freq: Once | INTRAMUSCULAR | Status: AC
  Administered 2021-02-20: 30 mg via INTRAVENOUS
  Filled 2021-02-20: qty 1

## 2021-02-20 NOTE — ED Notes (Signed)
[place] increased to 4LPM

## 2021-02-20 NOTE — ED Notes (Signed)
No changes, alert, NAD, calm, hiccups, placed on 2L [place]. Pending meds from pharmacy /verification.
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
## 2021-02-20 NOTE — ED Notes (Signed)
Admitting at [facility]. Pt to be moved to room. Hiccups present and intermittent cough continues

## 2021-02-20 NOTE — ED Notes (Signed)
Poison control updated on progress.

## 2021-02-20 NOTE — ED Notes (Signed)
Labs drawn, pt going to xray, then to room 39. Sitter remains present. MD at [facility].

## 2021-02-20 NOTE — ED Notes (Signed)
Pt belongings placed in locker #5. Already inventoried by previous RN, [place].

## 2021-02-20 NOTE — ED Notes (Addendum)
Pt alert, NAD, calm, interactive, resps e/u, speaking in clear complete sentences, c/o NV and abd pain, and dry mouth. Denies any other sx. Requesting ativan, states zofran not helping. Mild fine hand tremor noted. EDP Dr. Stark Jock at [facility]. Repeat 1L NS bolus ordered. Intermittent non-productive cough noted. Sitter at [employer].

## 2021-02-20 NOTE — ED Notes (Signed)
Patient reports that he has been consuming 2 small bottles of hand sanitizer for the last 3 days in an attempt to "hurt himself." Patient denies any other drug or alcohol use at that time. Pt toxicology results tested positive for benzodiazepines and cocaine. When questioned on these results, pt reported that he smoked crack cocaine "a few days ago."
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
## 2021-02-20 NOTE — ED Notes (Signed)
Pt reporting tremors, pt tremulous only in the presence of staff. Pt shook briefly and stated, "im having a seizure." pt able to follow commands during this event. Provider made aware of this occurrence.
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
## 2021-02-20 NOTE — Progress Notes (Signed)
Arterial Line placement attempted 3 times unsuccessfully. Dr Tacy Learn made aware.

## 2021-02-20 NOTE — Progress Notes (Signed)
Placed pt on BIPAP. He then began to C/O that he was going to throw up. Took pt off of BIPAP and he continues to gag. Put pt back on NRB.

## 2021-02-20 NOTE — ED Notes (Signed)
Pt dry heaving, not productive

## 2021-02-20 NOTE — ED Notes (Signed)
Critical Care team at bedside.  ?

## 2021-02-20 NOTE — ED Notes (Addendum)
Admitting MD at [facility] Lab at [facility]. Switched to NRB 15LPM 100%

## 2021-02-20 NOTE — ED Notes (Signed)
Dr Posey Pronto at bedside, reported Mag level and failed trial of bipap

## 2021-02-20 NOTE — ED Notes (Signed)
Report called to unit, SWOT RN to transport

## 2021-02-20 NOTE — ED Notes (Signed)
Attempt to draw labs unsuccessful x3
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
## 2021-02-20 NOTE — Progress Notes (Signed)
Poison Control updated.

## 2021-02-20 NOTE — Consult Note (Signed)
Per Roderic Palau, RN., pt is being medically admitted, and is having oxygenation problems. TTS will sign off at this time. Please order psych consult once the patient has transferred to the medical floor.

## 2021-02-20 NOTE — ED Notes (Signed)
Pt to xray, then to yellow 39.

## 2021-02-20 NOTE — ED Notes (Signed)
RT at [facility] for ABG. [place] 4L switched to venturi mask 6L 35% with improvement. Pt talking to self. Oriented to person, and month. Thought it was August 18, and that [place] the mall. Antizol not available. Pharmacy contacted.

## 2021-02-20 NOTE — Progress Notes (Signed)
Patient information has been sent to [facility] via secure chat to review for potential admission. Patient meets inpatient criteria per Margorie John, PA-C.   Situation ongoing, CSW will continue to monitor progress.    Signed:  Mariea Clonts, MSW, LCSW-A  02/20/2021 8:54 AM

## 2021-02-21 ENCOUNTER — Inpatient Hospital Stay (HOSPITAL_COMMUNITY): Payer: Self-pay

## 2021-02-21 DIAGNOSIS — N179 Acute kidney failure, unspecified: Secondary | ICD-10-CM

## 2021-02-21 DIAGNOSIS — F332 Major depressive disorder, recurrent severe without psychotic features: Secondary | ICD-10-CM

## 2021-02-21 DIAGNOSIS — J9601 Acute respiratory failure with hypoxia: Secondary | ICD-10-CM

## 2021-02-21 LAB — URINALYSIS, ROUTINE W REFLEX MICROSCOPIC
Bilirubin Urine: NEGATIVE
Glucose, UA: 50 mg/dL — AB
Ketones, ur: 5 mg/dL — AB
Nitrite: NEGATIVE
Protein, ur: 30 mg/dL — AB
Specific Gravity, Urine: 1.016 (ref 1.005–1.030)
pH: 5 (ref 5.0–8.0)

## 2021-02-21 LAB — POCT I-STAT 7, (LYTES, BLD GAS, ICA,H+H)
Acid-base deficit: 5 mmol/L — ABNORMAL HIGH (ref 0.0–2.0)
Acid-base deficit: 5 mmol/L — ABNORMAL HIGH (ref 0.0–2.0)
Acid-base deficit: 5 mmol/L — ABNORMAL HIGH (ref 0.0–2.0)
Acid-base deficit: 6 mmol/L — ABNORMAL HIGH (ref 0.0–2.0)
Bicarbonate: 19.8 mmol/L — ABNORMAL LOW (ref 20.0–28.0)
Bicarbonate: 21.3 mmol/L (ref 20.0–28.0)
Bicarbonate: 22.5 mmol/L (ref 20.0–28.0)
Bicarbonate: 19.6 mmol/L — ABNORMAL LOW (ref 20.0–28.0)
Calcium, Ion: 0.9 mmol/L — ABNORMAL LOW (ref 1.15–1.40)
Calcium, Ion: 0.93 mmol/L — ABNORMAL LOW (ref 1.15–1.40)
Calcium, Ion: 0.93 mmol/L — ABNORMAL LOW (ref 1.15–1.40)
Calcium, Ion: 0.91 mmol/L — ABNORMAL LOW (ref 1.15–1.40)
HCT: 36 % — ABNORMAL LOW (ref 39.0–52.0)
HCT: 38 % — ABNORMAL LOW (ref 39.0–52.0)
HCT: 39 % (ref 39.0–52.0)
HCT: 38 % — ABNORMAL LOW (ref 39.0–52.0)
Hemoglobin: 12.2 g/dL — ABNORMAL LOW (ref 13.0–17.0)
Hemoglobin: 12.9 g/dL — ABNORMAL LOW (ref 13.0–17.0)
Hemoglobin: 13.3 g/dL (ref 13.0–17.0)
Hemoglobin: 12.9 g/dL — ABNORMAL LOW (ref 13.0–17.0)
O2 Saturation: 99 %
O2 Saturation: 93 %
O2 Saturation: 100 %
O2 Saturation: 93 %
Patient temperature: 97.5
Patient temperature: 97.5
Patient temperature: 96.9
Patient temperature: 97
Potassium: 4 mmol/L (ref 3.5–5.1)
Potassium: 4.2 mmol/L (ref 3.5–5.1)
Potassium: 4.5 mmol/L (ref 3.5–5.1)
Potassium: 4.8 mmol/L (ref 3.5–5.1)
Sodium: 120 mmol/L — ABNORMAL LOW (ref 135–145)
Sodium: 119 mmol/L — CL (ref 135–145)
Sodium: 119 mmol/L — CL (ref 135–145)
Sodium: 119 mmol/L — CL (ref 135–145)
TCO2: 21 mmol/L — ABNORMAL LOW (ref 22–32)
TCO2: 23 mmol/L (ref 22–32)
TCO2: 24 mmol/L (ref 22–32)
TCO2: 21 mmol/L — ABNORMAL LOW (ref 22–32)
pCO2 arterial: 33.9 mmHg (ref 32.0–48.0)
pCO2 arterial: 41.9 mmHg (ref 32.0–48.0)
pCO2 arterial: 49 mmHg — ABNORMAL HIGH (ref 32.0–48.0)
pCO2 arterial: 36.4 mmHg (ref 32.0–48.0)
pH, Arterial: 7.371 (ref 7.350–7.450)
pH, Arterial: 7.312 — ABNORMAL LOW (ref 7.350–7.450)
pH, Arterial: 7.264 — ABNORMAL LOW (ref 7.350–7.450)
pH, Arterial: 7.335 — ABNORMAL LOW (ref 7.350–7.450)
pO2, Arterial: 125 mmHg — ABNORMAL HIGH (ref 83.0–108.0)
pO2, Arterial: 71 mmHg — ABNORMAL LOW (ref 83.0–108.0)
pO2, Arterial: 226 mmHg — ABNORMAL HIGH (ref 83.0–108.0)
pO2, Arterial: 69 mmHg — ABNORMAL LOW (ref 83.0–108.0)

## 2021-02-21 LAB — GLUCOSE, CAPILLARY
Glucose-Capillary: 100 mg/dL — ABNORMAL HIGH (ref 70–99)
Glucose-Capillary: 101 mg/dL — ABNORMAL HIGH (ref 70–99)
Glucose-Capillary: 125 mg/dL — ABNORMAL HIGH (ref 70–99)
Glucose-Capillary: 151 mg/dL — ABNORMAL HIGH (ref 70–99)
Glucose-Capillary: 164 mg/dL — ABNORMAL HIGH (ref 70–99)
Glucose-Capillary: 89 mg/dL (ref 70–99)

## 2021-02-21 LAB — RENAL FUNCTION PANEL
Albumin: 2.5 g/dL — ABNORMAL LOW (ref 3.5–5.0)
Anion gap: 16 — ABNORMAL HIGH (ref 5–15)
BUN: 58 mg/dL — ABNORMAL HIGH (ref 6–20)
CO2: 20 mmol/L — ABNORMAL LOW (ref 22–32)
Calcium: 6.7 mg/dL — ABNORMAL LOW (ref 8.9–10.3)
Chloride: 85 mmol/L — ABNORMAL LOW (ref 98–111)
Creatinine, Ser: 5.69 mg/dL — ABNORMAL HIGH (ref 0.61–1.24)
GFR, Estimated: 12 mL/min — ABNORMAL LOW (ref >60)
Glucose, Bld: 127 mg/dL — ABNORMAL HIGH (ref 70–99)
Phosphorus: 8.4 mg/dL — ABNORMAL HIGH (ref 2.5–4.6)
Potassium: 4.8 mmol/L (ref 3.5–5.1)
Sodium: 121 mmol/L — ABNORMAL LOW (ref 135–145)

## 2021-02-21 LAB — COMPREHENSIVE METABOLIC PANEL
ALT: 68 U/L — ABNORMAL HIGH (ref 0–44)
AST: 56 U/L — ABNORMAL HIGH (ref 15–41)
Albumin: 2.9 g/dL — ABNORMAL LOW (ref 3.5–5.0)
Alkaline Phosphatase: 42 U/L (ref 38–126)
Anion gap: 17 — ABNORMAL HIGH (ref 5–15)
BUN: 50 mg/dL — ABNORMAL HIGH (ref 6–20)
CO2: 20 mmol/L — ABNORMAL LOW (ref 22–32)
Calcium: 7 mg/dL — ABNORMAL LOW (ref 8.9–10.3)
Chloride: 84 mmol/L — ABNORMAL LOW (ref 98–111)
Creatinine, Ser: 5.24 mg/dL — ABNORMAL HIGH (ref 0.61–1.24)
GFR, Estimated: 13 mL/min — ABNORMAL LOW (ref >60)
Glucose, Bld: 155 mg/dL — ABNORMAL HIGH (ref 70–99)
Potassium: 4.7 mmol/L (ref 3.5–5.1)
Sodium: 121 mmol/L — ABNORMAL LOW (ref 135–145)
Total Bilirubin: 0.7 mg/dL (ref 0.3–1.2)
Total Protein: 5.8 g/dL — ABNORMAL LOW (ref 6.5–8.1)

## 2021-02-21 LAB — TROPONIN I (HIGH SENSITIVITY)
Troponin I (High Sensitivity): 22 ng/L — ABNORMAL HIGH (ref <18)
Troponin I (High Sensitivity): 20 ng/L — ABNORMAL HIGH (ref <18)

## 2021-02-21 LAB — CBC
HCT: 35.9 % — ABNORMAL LOW (ref 39.0–52.0)
Hemoglobin: 12.9 g/dL — ABNORMAL LOW (ref 13.0–17.0)
MCH: 29.8 pg (ref 26.0–34.0)
MCHC: 35.9 g/dL (ref 30.0–36.0)
MCV: 82.9 fL (ref 80.0–100.0)
Platelets: 443 10*3/uL — ABNORMAL HIGH (ref 150–400)
RBC: 4.33 MIL/uL (ref 4.22–5.81)
RDW: 13.2 % (ref 11.5–15.5)
WBC: 15.8 10*3/uL — ABNORMAL HIGH (ref 4.0–10.5)
nRBC: 0.2 % (ref 0.0–0.2)

## 2021-02-21 LAB — MAGNESIUM: Magnesium: 2.4 mg/dL (ref 1.7–2.4)

## 2021-02-21 LAB — BASIC METABOLIC PANEL
Anion gap: 16 — ABNORMAL HIGH (ref 5–15)
BUN: 60 mg/dL — ABNORMAL HIGH (ref 6–20)
CO2: 19 mmol/L — ABNORMAL LOW (ref 22–32)
Calcium: 6.3 mg/dL — CL (ref 8.9–10.3)
Chloride: 84 mmol/L — ABNORMAL LOW (ref 98–111)
Creatinine, Ser: 5.63 mg/dL — ABNORMAL HIGH (ref 0.61–1.24)
GFR, Estimated: 12 mL/min — ABNORMAL LOW (ref >60)
Glucose, Bld: 112 mg/dL — ABNORMAL HIGH (ref 70–99)
Potassium: 5.5 mmol/L — ABNORMAL HIGH (ref 3.5–5.1)
Sodium: 119 mmol/L — CL (ref 135–145)

## 2021-02-21 LAB — PROTIME-INR
INR: 1.2 (ref 0.8–1.2)
Prothrombin Time: 15.1 seconds (ref 11.4–15.2)

## 2021-02-21 LAB — LACTIC ACID, PLASMA
Lactic Acid, Venous: 3.7 mmol/L (ref 0.5–1.9)
Lactic Acid, Venous: 2.3 mmol/L (ref 0.5–1.9)

## 2021-02-21 LAB — URINE CULTURE

## 2021-02-21 LAB — TRIGLYCERIDES: Triglycerides: 174 mg/dL — ABNORMAL HIGH (ref <150)

## 2021-02-21 MED ORDER — DEXMEDETOMIDINE HCL IN NACL 400 MCG/100ML IV SOLN
0.4000 ug/kg/h | INTRAVENOUS | Status: DC
  Administered 2021-02-21 (×2): 0.4 ug/kg/h via INTRAVENOUS
  Administered 2021-02-22: 0.6 ug/kg/h via INTRAVENOUS
  Administered 2021-02-22: 0.7 ug/kg/h via INTRAVENOUS
  Administered 2021-02-22: 0.8 ug/kg/h via INTRAVENOUS
  Administered 2021-02-23 (×2): 1.2 ug/kg/h via INTRAVENOUS
  Administered 2021-02-23: 1 ug/kg/h via INTRAVENOUS
  Filled 2021-02-21 (×9): qty 100

## 2021-02-21 MED ORDER — MIDAZOLAM HCL 2 MG/2ML IJ SOLN
INTRAMUSCULAR | Status: AC
  Filled 2021-02-21: qty 2

## 2021-02-21 MED ORDER — CHLORHEXIDINE GLUCONATE 0.12% ORAL RINSE (MEDLINE KIT)
15.0000 mL | Freq: Two times a day (BID) | OROMUCOSAL | Status: DC
Start: 2021-02-21 — End: 2021-02-28
  Administered 2021-02-21 – 2021-02-27 (×14): 15 mL via OROMUCOSAL

## 2021-02-21 MED ORDER — NOREPINEPHRINE 16 MG/250ML-% IV SOLN: 0.0000 ug/min | INTRAVENOUS | Status: DC

## 2021-02-21 MED ORDER — LEVOFLOXACIN IN D5W 500 MG/100ML IV SOLN
500.0000 mg | INTRAVENOUS | Status: DC
  Administered 2021-02-21: 500 mg via INTRAVENOUS
  Filled 2021-02-21: qty 100

## 2021-02-21 MED ORDER — PRISMASOL BGK 4/2.5 32-4-2.5 MEQ/L EC SOLN
Status: DC
Start: 2021-02-21 — End: 2021-02-23
  Filled 2021-02-21 (×24): qty 5000

## 2021-02-21 MED ORDER — FENTANYL BOLUS VIA INFUSION
50.0000 ug | INTRAVENOUS | Status: DC | PRN
  Administered 2021-02-21 – 2021-02-24 (×8): 100 ug via INTRAVENOUS
  Filled 2021-02-21: qty 100

## 2021-02-21 MED ORDER — CALCIUM GLUCONATE-NACL 2-0.675 GM/100ML-% IV SOLN
2.0000 g | Freq: Once | INTRAVENOUS | Status: AC
  Administered 2021-02-21: 2000 mg via INTRAVENOUS
  Filled 2021-02-21: qty 100

## 2021-02-21 MED ORDER — ADULT MULTIVITAMIN W/MINERALS CH
1.0000 | ORAL_TABLET | Freq: Every day | ORAL | Status: DC
  Administered 2021-02-21: 1
  Filled 2021-02-21: qty 1

## 2021-02-21 MED ORDER — ORAL CARE MOUTH RINSE
15.0000 mL | OROMUCOSAL | Status: DC
Start: 2021-02-21 — End: 2021-02-24
  Administered 2021-02-21 – 2021-02-24 (×34): 15 mL via OROMUCOSAL

## 2021-02-21 MED ORDER — SODIUM CHLORIDE 0.9 % IV SOLN
350.0000 [IU]/h | INTRAVENOUS | Status: DC
Start: 2021-02-21 — End: 2021-02-23
  Administered 2021-02-21: 350 [IU]/h via INTRAVENOUS_CENTRAL
  Administered 2021-02-22 (×2): 750 [IU]/h via INTRAVENOUS_CENTRAL
  Filled 2021-02-21 (×4): qty 2

## 2021-02-21 MED ORDER — FENTANYL CITRATE (PF) 100 MCG/2ML IJ SOLN
50.0000 ug | Freq: Once | INTRAMUSCULAR | Status: DC
Start: 2021-02-21 — End: 2021-02-21

## 2021-02-21 MED ORDER — THIAMINE HCL 100 MG PO TABS
100.0000 mg | ORAL_TABLET | Freq: Every day | ORAL | Status: DC
  Administered 2021-02-21: 100 mg
  Filled 2021-02-21: qty 1

## 2021-02-21 MED ORDER — FENTANYL CITRATE (PF) 100 MCG/2ML IJ SOLN: 100.0000 ug | Freq: Once | INTRAMUSCULAR | Status: AC

## 2021-02-21 MED ORDER — PRISMASOL BGK 4/2.5 32-4-2.5 MEQ/L REPLACEMENT SOLN
Status: DC
  Filled 2021-02-21 (×5): qty 5000

## 2021-02-21 MED ORDER — FENTANYL CITRATE (PF) 100 MCG/2ML IJ SOLN
INTRAMUSCULAR | Status: AC
  Filled 2021-02-21: qty 2

## 2021-02-21 MED ORDER — NOREPINEPHRINE 4 MG/250ML-% IV SOLN
0.0000 ug/min | INTRAVENOUS | Status: DC
  Filled 2021-02-21: qty 250

## 2021-02-21 MED ORDER — HEPARIN (PORCINE) 2000 UNITS/L FOR CRRT: INTRAVENOUS_CENTRAL | Status: DC | PRN

## 2021-02-21 MED ORDER — LEVOFLOXACIN IN D5W 500 MG/100ML IV SOLN
500.0000 mg | INTRAVENOUS | Status: DC
  Administered 2021-02-22 – 2021-02-24 (×3): 500 mg via INTRAVENOUS
  Filled 2021-02-21 (×4): qty 100

## 2021-02-21 MED ORDER — ROCURONIUM BROMIDE 10 MG/ML (PF) SYRINGE
PREFILLED_SYRINGE | INTRAVENOUS | Status: AC
  Administered 2021-02-21: 100 mg
  Filled 2021-02-21: qty 10

## 2021-02-21 MED ORDER — PHENYLEPHRINE 40 MCG/ML (10ML) SYRINGE FOR IV PUSH (FOR BLOOD PRESSURE SUPPORT)
PREFILLED_SYRINGE | INTRAVENOUS | Status: AC
  Filled 2021-02-21: qty 20

## 2021-02-21 MED ORDER — THIAMINE HCL 100 MG/ML IJ SOLN: 100.0000 mg | Freq: Every day | INTRAMUSCULAR | Status: DC

## 2021-02-21 MED ORDER — MIDAZOLAM HCL 2 MG/2ML IJ SOLN
1.0000 mg | INTRAMUSCULAR | Status: DC | PRN
  Administered 2021-02-21 – 2021-02-24 (×14): 2 mg via INTRAVENOUS
  Filled 2021-02-21 (×13): qty 2

## 2021-02-21 MED ORDER — PRISMASOL BGK 4/2.5 32-4-2.5 MEQ/L REPLACEMENT SOLN
Status: DC
  Filled 2021-02-21 (×3): qty 5000

## 2021-02-21 MED ORDER — HEPARIN SODIUM (PORCINE) 1000 UNIT/ML DIALYSIS
1000.0000 [IU] | INTRAMUSCULAR | Status: DC | PRN
Start: 2021-02-21 — End: 2021-02-23
  Filled 2021-02-21: qty 6

## 2021-02-21 MED ORDER — FUROSEMIDE 10 MG/ML IJ SOLN
40.0000 mg | Freq: Once | INTRAMUSCULAR | Status: AC
  Administered 2021-02-21: 40 mg via INTRAVENOUS
  Filled 2021-02-21: qty 4

## 2021-02-21 MED ORDER — SODIUM CHLORIDE 0.9 % IV SOLN
INTRAVENOUS | Status: DC | PRN
  Administered 2021-02-21: 500 mL via INTRAVENOUS

## 2021-02-21 MED ORDER — DOCUSATE SODIUM 50 MG/5ML PO LIQD: 100.0000 mg | Freq: Every day | ORAL | Status: DC | PRN

## 2021-02-21 MED ORDER — NOREPINEPHRINE 4 MG/250ML-% IV SOLN
INTRAVENOUS | Status: AC
  Administered 2021-02-21: 3 ug/min via INTRAVENOUS
  Filled 2021-02-21: qty 250

## 2021-02-21 MED ORDER — FENTANYL 2500MCG IN NS 250ML (10MCG/ML) PREMIX INFUSION
0.0000 ug/h | INTRAVENOUS | Status: DC
  Administered 2021-02-21: 300 ug/h via INTRAVENOUS
  Administered 2021-02-21: 350 ug/h via INTRAVENOUS
  Administered 2021-02-21: 50 ug/h via INTRAVENOUS
  Administered 2021-02-22 (×2): 350 ug/h via INTRAVENOUS
  Administered 2021-02-22: 300 ug/h via INTRAVENOUS
  Administered 2021-02-23: 400 ug/h via INTRAVENOUS
  Administered 2021-02-23: 200 ug/h via INTRAVENOUS
  Administered 2021-02-23 – 2021-02-24 (×4): 400 ug/h via INTRAVENOUS
  Filled 2021-02-21 (×12): qty 250

## 2021-02-21 MED ORDER — FUROSEMIDE 10 MG/ML IJ SOLN
80.0000 mg | Freq: Once | INTRAMUSCULAR | Status: AC
  Administered 2021-02-21: 80 mg via INTRAVENOUS
  Filled 2021-02-21: qty 8

## 2021-02-21 MED ORDER — FOLIC ACID 1 MG PO TABS
1.0000 mg | ORAL_TABLET | Freq: Every day | ORAL | Status: DC
  Administered 2021-02-21: 1 mg
  Filled 2021-02-21: qty 1

## 2021-02-21 MED ORDER — FENTANYL CITRATE (PF) 100 MCG/2ML IJ SOLN
INTRAMUSCULAR | Status: AC
  Administered 2021-02-21: 100 ug via INTRAVENOUS
  Filled 2021-02-21: qty 2

## 2021-02-21 MED ORDER — POLYETHYLENE GLYCOL 3350 17 G PO PACK: 17.0000 g | PACK | Freq: Every day | ORAL | Status: DC | PRN

## 2021-02-21 MED ORDER — ETOMIDATE 2 MG/ML IV SOLN
INTRAVENOUS | Status: AC
  Administered 2021-02-21: 20 mg
  Filled 2021-02-21: qty 20

## 2021-02-21 NOTE — Progress Notes (Addendum)
[place] Progress Note Patient Name: [name] DOB: [birth year] MRN: [id number]   Date of Service  02/21/2021  HPI/Events of Note  Notified of persistent restlessness and agitation.  Pt with suicide attempt with hand sanitizer, in AKI with anion gap metabolic acidosis, currently on BIPAP. Pt given several doses of ativan throughout the day.  BP 137/100, HR 57, RR 21, O2 sats 91% Pt is restless, trying to remove BIPAP.  eICU Interventions  Start on precedex gtt.  Get CXR stat.     Intervention Category Intermediate Interventions: Respiratory distress - evaluation and management;Other:  Elsie Lincoln 02/21/2021, 1:39 AM  2:23 AM CXR shows worsening infiltrates bilaterally consistent with pulmonary edema.   Plan> Stop HCO3 gtt. Give Lasix '40mg'$  IV.  Get ABG. Pt is at risk for intubation.  6:58 AM Pt now intubated, requiring levophed.  He is hypothermic, with rising WBC a 15.8.  Pt also vomited during intubation.  ABG reviewed 7.264/49/226.  Plan> Will start on levofloxacin given penicillin allergy.  Increase tidal volume to 540, rate 24.  Repeat ABG in 1 hour.

## 2021-02-21 NOTE — Progress Notes (Signed)
Date and time results received: 02/21/21 0820  Test: Sodium 119          Calcium 6.3  Name of Provider Notified: ELink  Orders Received? Or Actions Taken?: Orders Received - See Orders for details

## 2021-02-21 NOTE — Progress Notes (Signed)
Poison Control updated

## 2021-02-21 NOTE — Progress Notes (Signed)
Patient ID: [name], male   DOB: [birth year], 40 y.o.   MRN: [id number] [facility] Progress Note   Assessment/ Plan:   1.  Acute kidney injury: Appears consistent with acute alcohol poisoning likely with associated ATN; labs from yesterday misleading as it appears that sample was drawn just downstream from bicarbonate infusion (osmolar gap inaccurate).  With worsening renal function overnight/oliguria and multiple metabolic abnormalities, will undertake CRRT-plan discussed with Dr. Lake Bells for assistance with dialysis catheter placement. 2.  Anion gap metabolic acidosis: Likely reflective of acute kidney injury along with starvation/alcoholic ketosis.  Partially improved with sodium bicarbonate drip and will begin CRRT. 3.  Hypomagnesemia: Secondary to malnutrition/poor intake, will replace via intravenous route. 4.  Hyponatremia: Measured serum osmolality elevated; suspect pseudohyponatremia.  5.  Suicidal attempt with history of underlying schizophrenia: Working towards medical stabilization prior to inpatient admission for additional psychiatric help.  Subjective:   With worsening restlessness/work of breath overnight on NIPPV and developed respiratory failure requiring intubation earlier this morning.  Poor response to furosemide.   Objective:   BP (!) 101/54   Pulse (!) 51   Temp (!) 97 F (36.1 C) (Axillary)   Resp (!) 24   Ht _0  (1.676 m)   Wt 72.6 kg   SpO2 (!) 88%   BMI 25.82 kg/m   Intake/Output Summary (Last 24 hours) at 02/21/2021 0907 Last data filed at 02/21/2021 0800 Gross per 24 hour  Intake 1377.05 ml  Output 352 ml  Net 1025.05 ml   Weight change:   Physical Exam: Gen: Intubated, sedated, appears comfortable CVS: Pulse regular rhythm, normal rate, S1 and S2 normal Resp: Anteriorly clear to auscultation, no rales/rhonchi Abd: Soft, flat, nontender, bowel sounds normal Ext: No lower extremity edema  Imaging: DG Chest 1 View  Result Date:  02/20/2021 CLINICAL DATA:  Hypoxia EXAM: CHEST  1 VIEW COMPARISON:  Chest radiograph obtained 1 day prior FINDINGS: The heart and mediastinum are prominent, exaggerated by low lung volumes and AP technique. Lung volumes are markedly diminished, worsened since the radiograph from 1 day prior. There are streaky opacities throughout both lungs with more focal opacity in the medial right base. There is likely a small right pleural effusion, new in the interim. There is no significant left effusion. There is no appreciable pneumothorax. Plate and screw fixation of the left clavicle is again seen. There is no acute osseous abnormality. IMPRESSION: Markedly low lung volumes with new streaky opacities bilaterally and probable new small right pleural effusion. Findings suggest new pulmonary interstitial edema given rapid onset; however, infection could have a similar appearance. Electronically Signed   By: Valetta Mole MD   On: 02/20/2021 11:36   DG Chest 2 View  Result Date: 02/19/2021 CLINICAL DATA:  Abdomen pain EXAM: CHEST - 2 VIEW COMPARISON:  01/05/2021 FINDINGS: The heart size and mediastinal contours are within normal limits. Both lungs are clear. Surgical plate and fixating screws at the distal left clavicle. IMPRESSION: No active cardiopulmonary disease. Electronically Signed   By: Donavan Foil M.D.   On: 02/19/2021 17:56   US RENAL  Result Date: 02/20/2021 CLINICAL DATA:  Acute kidney injury. History of partial left nephrectomy. EXAM: RENAL / URINARY TRACT ULTRASOUND COMPLETE COMPARISON:  Abdominal ultrasound 11/25/2017 FINDINGS: Right Kidney: Renal measurements: 10.4 x 4.7 x 6.2 cm = volume: 156 mL. Echogenicity within normal limits. No mass or hydronephrosis visualized. Small volume perinephric fluid. Left Kidney: Renal measurements: 9.2 x 4.6 x 4.8 cm = volume: 107  mL. Echogenicity within normal limits. No mass or hydronephrosis visualized. Bladder: Appears normal for degree of bladder distention. Other:  As previously seen, the liver is diffusely hyperechoic compatible with steatosis. Ascites and a left pleural effusion are noted. IMPRESSION: 1.  No hydronephrosis. 2. Hepatic steatosis. 3. Ascites and left pleural effusion. Electronically Signed   By: Logan Bores M.D.   On: 02/20/2021 13:48   DG CHEST PORT 1 VIEW  Result Date: 02/21/2021 CLINICAL DATA:  Status post intubation EXAM: PORTABLE CHEST 1 VIEW COMPARISON:  Film from earlier in the same day. FINDINGS: Cardiac shadow is stable. Persistent bilateral airspace opacities are again identified. Endotracheal tube has been placed and lies 3 cm above the carina in satisfactory position. Gastric catheter extends into the stomach. No bony abnormality is noted. IMPRESSION: Stable bilateral airspace opacities. Endotracheal tube and gastric catheter in satisfactory position. Electronically Signed   By: Inez Catalina M.D.   On: 02/21/2021 03:51   DG CHEST PORT 1 VIEW  Result Date: 02/21/2021 CLINICAL DATA:  Respiratory failure EXAM: PORTABLE CHEST 1 VIEW COMPARISON:  02/20/2021 FINDINGS: Cardiac shadow is stable. Significant increased airspace opacity is noted bilaterally left greater than right when compared with the prior exam. Again this likely represents edema. No bony abnormality is noted. Prior surgical changes in the left clavicle are seen. IMPRESSION: Significant increase in airspace opacity bilaterally left greater than right consistent with progressive edema. Electronically Signed   By: Inez Catalina M.D.   On: 02/21/2021 02:13    Labs: BMET Recent Labs  Lab 02/19/21 8527 02/20/21 0130 02/20/21 0136 02/20/21 1029 02/20/21 1054 02/20/21 1214 02/20/21 1457 02/20/21 1939 02/21/21 0228 02/21/21 0335 02/21/21 0452 02/21/21 0531 02/21/21 0830  NA 136 126* 126*   < > 125*   < > 121* 121* 120* 119* 121* 119* 119*  K 3.2* 4.1 4.2   < > 4.7   < > 4.0 3.9 4.0 4.2 4.7 4.5 4.8  CL 102 93* 95*  --  93*  --  79* 86*  --   --  84*  --   --   CO2 19* 8*   --   --  13*  --  26 19*  --   --  20*  --   --   GLUCOSE 155* 262* 265*  --  136*  --  684* 245*  --   --  155*  --   --   BUN 14 25* 24*  --  34*  --  33* 41*  --   --  50*  --   --   CREATININE 1.46* 3.33* 3.20*  --  3.92*  --  3.69* 4.42*  --   --  5.24*  --   --   CALCIUM 8.4* 7.7*  --   --  7.3*  --  5.7* 7.1*  --   --  7.0*  --   --   PHOS  --   --   --   --  5.3*  --  4.7*  --   --   --   --   --   --    < > = values in this interval not displayed.   CBC Recent Labs  Lab 02/19/21 0939 02/20/21 0130 02/20/21 0136 02/20/21 1054 02/20/21 1214 02/21/21 0335 02/21/21 0452 02/21/21 0531 02/21/21 0830  WBC 11.1* 15.9*  --  9.3  --   --  15.8*  --   --   NEUTROABS  --  12.9*  --   --   --   --   --   --   --   HGB 13.4 12.7*   < > 13.5   < > 12.9* 12.9* 13.3 12.9*  HCT 39.2 37.1*   < > 38.2*   < > 38.0* 35.9* 39.0 38.0*  MCV 86.3 87.5  --  84.9  --   --  82.9  --   --   PLT 429* 433*  --  465*  --   --  443*  --   --    < > = values in this interval not displayed.   Medications:     chlorhexidine gluconate (MEDLINE KIT)  15 mL Mouth Rinse BID   Chlorhexidine Gluconate Cloth  6 each Topical Daily   folic acid  1 mg Per Tube Daily   heparin injection (subcutaneous)  5,000 Units Subcutaneous Q8H   mouth rinse  15 mL Mouth Rinse 10 times per day   multivitamin with minerals  1 tablet Per Tube Daily   pantoprazole (PROTONIX) IV  40 mg Intravenous QHS   thiamine  100 mg Per Tube Daily   Or   thiamine  100 mg Intravenous Daily   Elmarie Shiley, MD 02/21/2021, 9:07 AM

## 2021-02-21 NOTE — Procedures (Signed)
Central Venous Catheter Insertion Procedure Note  [name]  [id number]  11-Apr-1981  Date:02/21/21  Time:12:07 PM   Provider Performing:Brent Toniyah Dilmore   Procedure: Insertion of Non-tunneled Central Venous Catheter(36556)with US guidance [id number])    Indication(s) Hemodialysis  Consent Risks of the procedure as well as the alternatives and risks of each were explained to the patient and/or caregiver.  Consent for the procedure was obtained and is signed in the bedside chart  Anesthesia Topical only with 1% lidocaine   Timeout Verified patient identification, verified procedure, site/side was marked, verified correct patient position, special equipment/implants available, medications/allergies/relevant history reviewed, required imaging and test results available.  Sterile Technique Maximal sterile technique including full sterile barrier drape, hand hygiene, sterile gown, sterile gloves, mask, hair covering, sterile ultrasound probe cover (if used).  Procedure Description Area of catheter insertion was cleaned with chlorhexidine and draped in sterile fashion.   With real-time ultrasound guidance a HD catheter was placed into the right internal jugular vein.  Nonpulsatile blood flow and easy flushing noted in all ports.  The catheter was sutured in place and sterile dressing applied.  Complications/Tolerance None; patient tolerated the procedure well. Chest X-ray is ordered to verify placement for internal jugular or subclavian cannulation.  Chest x-ray is not ordered for femoral cannulation.  EBL Minimal  Specimen(s) None   Roselie Awkward, MD [place] [facility] Pager: [phone] Cell: [phone] After 7:00 pm call [place]  [phone]

## 2021-02-21 NOTE — Progress Notes (Signed)
eLink Physician-Brief Progress Note Patient Name: [name] DOB: Apr 12, 1981 MRN: [id number]   Date of Service  02/21/2021  HPI/Events of Note  Critical labs: Na 119 (minimal change from prior of 120). Calcium 6.3.  Patient is being (re)started on CRRT.   eICU Interventions  CRRT will help address his hyponatremia.  2g calcium gluconate ordered for his hypocalcemia since CPP < 60 and he is requiring levophed to maintain his MAP.  Ordered Q12H ionized calcium levels.     Intervention Category Major Interventions: Electrolyte abnormality - evaluation and management  [name] 02/21/2021, 8:36 PM

## 2021-02-21 NOTE — Progress Notes (Signed)
NAME:  [name], MRN:  [id number], DOB:  12-28-1980, LOS: 1 ADMISSION DATE:  02/19/2021, CONSULTATION DATE:  8/5 REFERRING MD:  Evette Doffing, CHIEF COMPLAINT:  Dyspnea, confusion   History of Present Illness:  40 y/o male with an extensive past medical history presented to the ER with suicidal ideation ideation who drank hand sanitizer.    Pertinent  Medical History  Angina Bipolar disorder Breast Cancer Depression History of suicide attempt Hyperlipidemia Hypertension Cirrhosis Pancreatitis Schizophrenia Alcohol abuse  Significant Hospital Events: Including procedures, antibiotic start and stop dates in addition to other pertinent events   8/5 admission 8/6 ETT >  8/6 L radial arterial line >  8/6 intubated overnight for worsening hypoxemic and work of breathing, start CRRT  Interim History / Subjective:  Oliguric overnight Intubated this morning for worsening respiratory failure, hypoxemia  Objective   Blood pressure (!) 102/53, pulse (!) 52, temperature (!) 97 F (36.1 C), temperature source Axillary, resp. rate (!) 24, height '5\' 6"'$  (1.676 m), weight 72.6 kg, SpO2 (!) 88 %.    Vent Mode: PRVC FiO2 (%):  [60 %-100 %] 80 % Set Rate:  [18 bmp-24 bmp] 24 bmp Vt Set:  [510 mL] 510 mL PEEP:  [8 cmH20-12 cmH20] 8 cmH20 Plateau Pressure:  [24 cmH20] 24 cmH20   Intake/Output Summary (Last 24 hours) at 02/21/2021 [id number] Last data filed at 02/21/2021 0700 Gross per 24 hour  Intake 2306.74 ml  Output 350 ml  Net 1956.74 ml   Filed Weights   02/19/21 1628  Weight: 72.6 kg    Examination:  General:  In bed on vent HENT: NCAT ETT in place PULM: Crackles bases B, vent supported breathing CV: RRR, no mgr GI: BS+, soft, nontender MSK: normal bulk and tone Neuro: sedated on vent  8/6 CXR images personally reviewed: dense infiltrates in lungs bilaterally, ETT in place Acetone level positive, other volatiles including ethylene glycol are negative  Resolved Hospital Problem  list     Assessment & Plan:  Suicide attempt by drinking hand sanitizer: isopropyl alcohol poisoning; initially there was concern for ethylene glycol poisoning, however there is no evidence of this on lab work, by the patient's reported history or his family's knowledge of this AKI due to the above Anion gap acidosis due to lactic and ketoacidosis Plan to start CRRT today> have discussed with family and nephrology and they agree Monitor UOP Replace electrolytes as needed Continue to monitor BMET per CRRT protocol Monitor lactic acid> will repeat later today  Acute respiratory failure with hypoxemia due to acute pulmonary edema from oliguria from renal failure Full mechanical vent support VAP prevention Daily WUA/SBT  Hyponatremia  Check triglyceride level Monitor labs  Need for sedation for mechanical ventilation Adjust RASS target to -2 Fentanyl infusion per PAD protocol Add prn versed OK to use precedex if needed  Alcohol abuse Thiamine, folate Monitor for withdrawal  Possible septic shock with elevated lactic acid > appears volume overloaded on exam Septic shock due to community acquired pneumonia Leveaquin No role for more IV fluids given physical exam Monitor lactic acid Hold tube feeding until we see where lactic acid is going; if drops then start tube feeding, if it increases however will need CT abdomen to look for ischemic bowel  Prognosis: guarded; high risk of inpatient death from multi-organ failure  Best Practice (right click and "Reselect all SmartList Selections" daily)   Diet/type: NPO DVT prophylaxis: prophylactic heparin  GI prophylaxis: PPI Lines: N/A Foley:  Yes, and it  is still needed Code Status:  full code Last date of multidisciplinary goals of care discussion [8/6 [place] and mother: full code; I updated her regarding the severity of his illness and high likelihood of death during this hospitalization]  Labs   CBC: Recent Labs  Lab  02/19/21 0939 02/20/21 0130 02/20/21 0136 02/20/21 1054 02/20/21 1214 02/21/21 0228 02/21/21 0335 02/21/21 0452 02/21/21 0531  WBC 11.1* 15.9*  --  9.3  --   --   --  15.8*  --   NEUTROABS  --  12.9*  --   --   --   --   --   --   --   HGB 13.4 12.7*   < > 13.5 13.6 12.2* 12.9* 12.9* 13.3  HCT 39.2 37.1*   < > 38.2* 40.0 36.0* 38.0* 35.9* 39.0  MCV 86.3 87.5  --  84.9  --   --   --  82.9  --   PLT 429* 433*  --  465*  --   --   --  443*  --    < > = values in this interval not displayed.    Basic Metabolic Panel: Recent Labs  Lab 02/20/21 0130 02/20/21 0136 02/20/21 1029 02/20/21 1054 02/20/21 1214 02/20/21 1457 02/20/21 1939 02/21/21 0228 02/21/21 0335 02/21/21 0452 02/21/21 0531  NA 126* 126*   < > 125*   < > 121* 121* 120* 119* 121* 119*  K 4.1 4.2   < > 4.7   < > 4.0 3.9 4.0 4.2 4.7 4.5  CL 93* 95*  --  93*  --  79* 86*  --   --  84*  --   CO2 8*  --   --  13*  --  26 19*  --   --  20*  --   GLUCOSE 262* 265*  --  136*  --  684* 245*  --   --  155*  --   BUN 25* 24*  --  34*  --  33* 41*  --   --  50*  --   CREATININE 3.33* 3.20*  --  3.92*  --  3.69* 4.42*  --   --  5.24*  --   CALCIUM 7.7*  --   --  7.3*  --  5.7* 7.1*  --   --  7.0*  --   MG  --   --   --  0.9*  --  0.7* 2.5*  --   --   --   --   PHOS  --   --   --  5.3*  --  4.7*  --   --   --   --   --    < > = values in this interval not displayed.   GFR: Estimated Creatinine Clearance: 16.7 mL/min (A) (by C-G formula based on SCr of 5.24 mg/dL (H)). Recent Labs  Lab 02/19/21 0939 02/20/21 0130 02/20/21 1054 02/20/21 1457 02/21/21 0452 02/21/21 0453  PROCALCITON  --   --   --  2.08  --   --   WBC 11.1* 15.9* 9.3  --  15.8*  --   LATICACIDVEN  --   --   --  3.4*  --  3.7*    Liver Function Tests: Recent Labs  Lab 02/19/21 0939 02/20/21 0130 02/20/21 1054 02/21/21 0452  AST 74* 66* 50* 56*  ALT 88* 84* 77* 68*  ALKPHOS 45 40 38 42  BILITOT 0.8 0.6  0.5 0.7  PROT 6.6 6.2* 6.1* 5.8*   ALBUMIN 3.6 3.4* 3.3* 2.9*   Recent Labs  Lab 02/19/21 0939 02/20/21 1457  LIPASE 32 33  AMYLASE  --  40   No results for input(s): AMMONIA in the last 168 hours.  ABG    Component Value Date/Time   PHART 7.264 (L) 02/21/2021 0531   PCO2ART 49.0 (H) 02/21/2021 0531   PO2ART 226 (H) 02/21/2021 0531   HCO3 22.5 02/21/2021 0531   TCO2 24 02/21/2021 0531   ACIDBASEDEF 5.0 (H) 02/21/2021 0531   O2SAT 100.0 02/21/2021 0531     Coagulation Profile: Recent Labs  Lab 02/20/21 1457 02/21/21 0452  INR 1.2 1.2    Cardiac Enzymes: No results for input(s): CKTOTAL, CKMB, CKMBINDEX, TROPONINI in the last 168 hours.  HbA1C: Hgb A1c MFr Bld  Date/Time Value Ref Range Status  01/08/2021 06:32 PM 4.6 (L) 4.8 - 5.6 % Final    Comment:    (NOTE) Pre diabetes:          5.7%-6.4%  Diabetes:              >6.4%  Glycemic control for   <7.0% adults with diabetes   07/30/2018 06:28 AM 4.9 4.8 - 5.6 % Final    Comment:    (NOTE) Pre diabetes:          5.7%-6.4% Diabetes:              >6.4% Glycemic control for   <7.0% adults with diabetes     CBG: Recent Labs  Lab 02/20/21 1408 02/20/21 1547 02/20/21 1944 02/20/21 2321 02/21/21 0354  GLUCAP 181* 174* 183* 142* 125*       Critical care time: 35 minutes    Roselie Awkward, MD [name] Pager: [phone] Cell: [phone] After 7:00 pm call Elink  [phone]

## 2021-02-21 NOTE — Progress Notes (Addendum)
PCCM interval progress note:  Patient had worsening restlessness and increased work of breathing overnight despite precedex and bipap.  Repeat CXR with pulmonary edema, he was given Lasix '40mg'$  with minimal urine output.  Pt was awake, but confused and tachypneic on evaluation.  Pt's father was notified and gave consent for intubation.    During intubation, pt had copious frothy secretions and emesis, concern for aspiration.    Given his AKI, foley placed and additional Lasix '80mg'$  given, though suspect he may progress to needing dialysis.  Repeat labs are pending.    Otilio Carpen Earlena Werst, PA-C [facility] See Amion for pager If no response to pager , please call [phone] until 7pm After 7:00 pm call Elink  [phone]   Additional critical care time 45 minutes

## 2021-02-21 NOTE — Progress Notes (Signed)
Pt was intubated at bedside by Duwayne Heck MD at [facility]. RT and additional RNs in the room during procedure. Meds given were as follows:  [id number]: 149mg fentanyl 0313: '20mg'$  etomidate  0314: '100mg'$  rocuronium 0315: 7.5 ETT placed, 25 @ lip  VSS throughout procedure.

## 2021-02-21 NOTE — Progress Notes (Signed)
PHARMACY NOTE:  ANTIMICROBIAL RENAL DOSAGE ADJUSTMENT  Current antimicrobial regimen includes a mismatch between antimicrobial dosage and estimated renal function.  As per policy approved by the Pharmacy & Therapeutics and Medical Executive Committees, the antimicrobial dosage will be adjusted accordingly.  Current antimicrobial dosage: Levaquin 500 mg IV every 48 hours  Indication: Pneumonia  Renal Function:  Estimated Creatinine Clearance: 16.7 mL/min (A) (by C-G formula based on SCr of 5.24 mg/dL (H)). '[]'$      On intermittent HD, scheduled: '[x]'$      On CRRT    Antimicrobial dosage has been changed to:  Levaquin 500 mg IV every 24 hours  Additional comments:   Thank you for allowing pharmacy to be a part of this patient's care.  Sloan Leiter, PharmD, BCPS, BCCCP Clinical Pharmacist Please refer to [facility] for [place] numbers 02/21/2021 11:19 AM

## 2021-02-21 NOTE — Procedures (Signed)
Intubation Procedure Note  [name]  [id number]  Oct 03, 1980  Date:02/21/21  Time:5:22 AM   Provider Performing:Tahliyah Anagnos Duwayne Heck    Procedure: Intubation (31500)  Indication(s) Respiratory Failure  Consent Risks of the procedure as well as the alternatives and risks of each were explained to the patient and/or caregiver.  Consent for the procedure was obtained and is signed in the bedside chart   Anesthesia Etomidate, Fentanyl, and Rocuronium   Time Out Verified patient identification, verified procedure, site/side was marked, verified correct patient position, special equipment/implants available, medications/allergies/relevant history reviewed, required imaging and test results available.   Sterile Technique Usual hand hygeine, masks, and gloves were used   Procedure Description Patient positioned in bed supine.  Sedation given as noted above.  Patient was intubated with endotracheal tube using Glidescope.  View was Grade 1 full glottis .  Number of attempts was 1.  Colorimetric CO2 detector was consistent with tracheal placement.   Complications/Tolerance Upon placement of glidescope, glottis fully visualized.  Copious clear frothy material from trachea, ET tube placed through cords, then patient vomited.  ET tube cuff then inflated, emesis suctioned.  Sat in mid 90s after intubation.   Placement confirmed with glidescope.  Chest X-ray is ordered to verify placement.   EBL none   Specimen(s) None

## 2021-02-21 NOTE — Progress Notes (Signed)
Pt's HR 42; SBP 115; RR 24; Dr Valeta Harms notified; order received for BMET and Troponin, and 12 lead ekg; Dr Posey Pronto notified CRRT filter clotted after 1 hour of intitiated; he will place order for Heparin at 600 units/hr per CRRT machine.

## 2021-02-21 NOTE — Procedures (Signed)
Arterial Catheter Insertion Procedure Note  Juwon Douthat  [id number]  08-Jul-1981  Date:02/21/21  Time:4:28 AM    Provider Performing: Otilio Carpen Jamond Neels    Procedure: Insertion of Arterial Line [phone]) with US guidance [id number])   Indication(s) Blood pressure monitoring and/or need for frequent ABGs  Consent Unable to obtain consent due to emergent nature of procedure.  Anesthesia None   Time Out Verified patient identification, verified procedure, site/side was marked, verified correct patient position, special equipment/implants available, medications/allergies/relevant history reviewed, required imaging and test results available.   Sterile Technique Maximal sterile technique including full sterile barrier drape, hand hygiene, sterile gown, sterile gloves, mask, hair covering, sterile ultrasound probe cover (if used).   Procedure Description Area of catheter insertion was cleaned with chlorhexidine and draped in sterile fashion. With real-time ultrasound guidance an arterial catheter was placed into the left radial artery.  Appropriate arterial tracings confirmed on monitor.     Complications/Tolerance None; patient tolerated the procedure well.   EBL Minimal   Specimen(s) None   Otilio Carpen Ada Holness, PA-C

## 2021-02-22 ENCOUNTER — Inpatient Hospital Stay (HOSPITAL_COMMUNITY): Payer: Self-pay

## 2021-02-22 LAB — POCT I-STAT 7, (LYTES, BLD GAS, ICA,H+H)
Acid-base deficit: 3 mmol/L — ABNORMAL HIGH (ref 0.0–2.0)
Acid-base deficit: 1 mmol/L (ref 0.0–2.0)
Bicarbonate: 22.4 mmol/L (ref 20.0–28.0)
Bicarbonate: 23.4 mmol/L (ref 20.0–28.0)
Calcium, Ion: 0.97 mmol/L — ABNORMAL LOW (ref 1.15–1.40)
Calcium, Ion: 1.01 mmol/L — ABNORMAL LOW (ref 1.15–1.40)
HCT: 33 % — ABNORMAL LOW (ref 39.0–52.0)
HCT: 31 % — ABNORMAL LOW (ref 39.0–52.0)
Hemoglobin: 11.2 g/dL — ABNORMAL LOW (ref 13.0–17.0)
Hemoglobin: 10.5 g/dL — ABNORMAL LOW (ref 13.0–17.0)
O2 Saturation: 89 %
O2 Saturation: 98 %
Patient temperature: 97
Patient temperature: 98.09
Potassium: 4.8 mmol/L (ref 3.5–5.1)
Potassium: 4.3 mmol/L (ref 3.5–5.1)
Sodium: 126 mmol/L — ABNORMAL LOW (ref 135–145)
Sodium: 127 mmol/L — ABNORMAL LOW (ref 135–145)
TCO2: 24 mmol/L (ref 22–32)
TCO2: 25 mmol/L (ref 22–32)
pCO2 arterial: 37 mmHg (ref 32.0–48.0)
pCO2 arterial: 38 mmHg (ref 32.0–48.0)
pH, Arterial: 7.386 (ref 7.350–7.450)
pH, Arterial: 7.395 (ref 7.350–7.450)
pO2, Arterial: 54 mmHg — ABNORMAL LOW (ref 83.0–108.0)
pO2, Arterial: 98 mmHg (ref 83.0–108.0)

## 2021-02-22 LAB — COMPREHENSIVE METABOLIC PANEL
ALT: 56 U/L — ABNORMAL HIGH (ref 0–44)
AST: 50 U/L — ABNORMAL HIGH (ref 15–41)
Albumin: 2.3 g/dL — ABNORMAL LOW (ref 3.5–5.0)
Alkaline Phosphatase: 45 U/L (ref 38–126)
Anion gap: 10 (ref 5–15)
BUN: 42 mg/dL — ABNORMAL HIGH (ref 6–20)
CO2: 22 mmol/L (ref 22–32)
Calcium: 7 mg/dL — ABNORMAL LOW (ref 8.9–10.3)
Chloride: 95 mmol/L — ABNORMAL LOW (ref 98–111)
Creatinine, Ser: 3.15 mg/dL — ABNORMAL HIGH (ref 0.61–1.24)
GFR, Estimated: 24 mL/min — ABNORMAL LOW (ref >60)
Glucose, Bld: 81 mg/dL (ref 70–99)
Potassium: 4.6 mmol/L (ref 3.5–5.1)
Sodium: 127 mmol/L — ABNORMAL LOW (ref 135–145)
Total Bilirubin: 1 mg/dL (ref 0.3–1.2)
Total Protein: 5.4 g/dL — ABNORMAL LOW (ref 6.5–8.1)

## 2021-02-22 LAB — RENAL FUNCTION PANEL
Albumin: 2.3 g/dL — ABNORMAL LOW (ref 3.5–5.0)
Albumin: 2 g/dL — ABNORMAL LOW (ref 3.5–5.0)
Anion gap: 10 (ref 5–15)
Anion gap: 6 (ref 5–15)
BUN: 43 mg/dL — ABNORMAL HIGH (ref 6–20)
BUN: 28 mg/dL — ABNORMAL HIGH (ref 6–20)
CO2: 21 mmol/L — ABNORMAL LOW (ref 22–32)
CO2: 26 mmol/L (ref 22–32)
Calcium: 6.9 mg/dL — ABNORMAL LOW (ref 8.9–10.3)
Calcium: 7.6 mg/dL — ABNORMAL LOW (ref 8.9–10.3)
Chloride: 95 mmol/L — ABNORMAL LOW (ref 98–111)
Chloride: 99 mmol/L (ref 98–111)
Creatinine, Ser: 3.17 mg/dL — ABNORMAL HIGH (ref 0.61–1.24)
Creatinine, Ser: 1.58 mg/dL — ABNORMAL HIGH (ref 0.61–1.24)
GFR, Estimated: 24 mL/min — ABNORMAL LOW (ref >60)
GFR, Estimated: 56 mL/min — ABNORMAL LOW (ref >60)
Glucose, Bld: 81 mg/dL (ref 70–99)
Glucose, Bld: 61 mg/dL — ABNORMAL LOW (ref 70–99)
Phosphorus: 4.7 mg/dL — ABNORMAL HIGH (ref 2.5–4.6)
Phosphorus: 2.2 mg/dL — ABNORMAL LOW (ref 2.5–4.6)
Potassium: 4.6 mmol/L (ref 3.5–5.1)
Potassium: 4 mmol/L (ref 3.5–5.1)
Sodium: 126 mmol/L — ABNORMAL LOW (ref 135–145)
Sodium: 131 mmol/L — ABNORMAL LOW (ref 135–145)

## 2021-02-22 LAB — GLUCOSE, CAPILLARY
Glucose-Capillary: 105 mg/dL — ABNORMAL HIGH (ref 70–99)
Glucose-Capillary: 126 mg/dL — ABNORMAL HIGH (ref 70–99)
Glucose-Capillary: 57 mg/dL — ABNORMAL LOW (ref 70–99)
Glucose-Capillary: 58 mg/dL — ABNORMAL LOW (ref 70–99)
Glucose-Capillary: 64 mg/dL — ABNORMAL LOW (ref 70–99)
Glucose-Capillary: 73 mg/dL (ref 70–99)
Glucose-Capillary: 76 mg/dL (ref 70–99)
Glucose-Capillary: 87 mg/dL (ref 70–99)
Glucose-Capillary: 90 mg/dL (ref 70–99)

## 2021-02-22 LAB — CBC WITH DIFFERENTIAL/PLATELET
Abs Immature Granulocytes: 0.1 10*3/uL — ABNORMAL HIGH (ref 0.00–0.07)
Band Neutrophils: 9 %
Basophils Absolute: 0 10*3/uL (ref 0.0–0.1)
Basophils Relative: 0 %
Eosinophils Absolute: 0.1 10*3/uL (ref 0.0–0.5)
Eosinophils Relative: 1 %
HCT: 32.8 % — ABNORMAL LOW (ref 39.0–52.0)
Hemoglobin: 11.9 g/dL — ABNORMAL LOW (ref 13.0–17.0)
Lymphocytes Relative: 10 %
Lymphs Abs: 0.7 10*3/uL (ref 0.7–4.0)
MCH: 30 pg (ref 26.0–34.0)
MCHC: 36.3 g/dL — ABNORMAL HIGH (ref 30.0–36.0)
MCV: 82.6 fL (ref 80.0–100.0)
Metamyelocytes Relative: 2 %
Monocytes Absolute: 0.4 10*3/uL (ref 0.1–1.0)
Monocytes Relative: 5 %
Neutro Abs: 6.1 10*3/uL (ref 1.7–7.7)
Neutrophils Relative %: 73 %
Platelets: 235 10*3/uL (ref 150–400)
RBC: 3.97 MIL/uL — ABNORMAL LOW (ref 4.22–5.81)
RDW: 13.2 % (ref 11.5–15.5)
WBC: 7.4 10*3/uL (ref 4.0–10.5)
nRBC: 0 % (ref 0.0–0.2)
nRBC: 1 /100 WBC — ABNORMAL HIGH

## 2021-02-22 LAB — PHOSPHORUS
Phosphorus: 3.2 mg/dL (ref 2.5–4.6)
Phosphorus: 2.2 mg/dL — ABNORMAL LOW (ref 2.5–4.6)

## 2021-02-22 LAB — MAGNESIUM
Magnesium: 2.4 mg/dL (ref 1.7–2.4)
Magnesium: 2.6 mg/dL — ABNORMAL HIGH (ref 1.7–2.4)
Magnesium: 2.3 mg/dL (ref 1.7–2.4)

## 2021-02-22 MED ORDER — NOREPINEPHRINE 4 MG/250ML-% IV SOLN: 0.0000 ug/min | INTRAVENOUS | Status: DC

## 2021-02-22 MED ORDER — DEXTROSE 50 % IV SOLN
INTRAVENOUS | Status: AC
  Administered 2021-02-22: 25 g via INTRAVENOUS
  Filled 2021-02-22: qty 50

## 2021-02-22 MED ORDER — PROSOURCE TF PO LIQD
45.0000 mL | Freq: Two times a day (BID) | ORAL | Status: DC
  Administered 2021-02-22 – 2021-02-23 (×3): 45 mL
  Filled 2021-02-22 (×2): qty 45

## 2021-02-22 MED ORDER — VITAL HIGH PROTEIN PO LIQD
1000.0000 mL | ORAL | Status: DC
  Administered 2021-02-22: 1000 mL

## 2021-02-22 MED ORDER — DEXTROSE 10 % IV SOLN: INTRAVENOUS | Status: DC

## 2021-02-22 MED ORDER — SODIUM CHLORIDE 0.9% FLUSH: 10.0000 mL | INTRAVENOUS | Status: DC | PRN

## 2021-02-22 MED ORDER — SODIUM CHLORIDE 0.9% FLUSH
10.0000 mL | Freq: Two times a day (BID) | INTRAVENOUS | Status: DC
  Administered 2021-02-22 – 2021-02-24 (×5): 10 mL

## 2021-02-22 MED ORDER — CALCIUM GLUCONATE-NACL 2-0.675 GM/100ML-% IV SOLN
2.0000 g | Freq: Once | INTRAVENOUS | Status: AC
  Administered 2021-02-22: 2000 mg via INTRAVENOUS
  Filled 2021-02-22: qty 100

## 2021-02-22 MED ORDER — DEXTROSE 50 % IV SOLN: 25.0000 g | Freq: Once | INTRAVENOUS | Status: AC

## 2021-02-22 NOTE — Progress Notes (Addendum)
eLink Physician-Brief Progress Note Patient Name: [name] DOB: 05-17-1981 MRN: [id number]   Date of Service  02/22/2021  HPI/Events of Note  Pt has had 2 hypoglycemic events today. Tube feeds are running at 58m/hr.  eICU Interventions  Add D10 at 40 cc/hour      Intervention Category Major Interventions: Other:  SMargaretmary Lombard8/01/2021, 7:54 PM  11:10 pm  Restraints ordered for intubated patient

## 2021-02-22 NOTE — Progress Notes (Signed)
Brief Nutrition Note RD working remotely.   Consult received for enteral/tube feeding initiation and management.  OGT placed yesterday at [place]. Adult Enteral Nutrition Protocol initiated by CCM. Full assessment to follow.  Admitting Dx: Metabolic acidosis 99991111 Ethylene glycol poisoning [T52.8X1A] Hypoxia [R09.02] AKI (acute kidney injury) ([place]) [N17.9]  Patient is now on CRRT.   Body mass index is 28.43 kg/m. Pt meets criteria for overweight status based on current BMI.  Labs:  Recent Labs  Lab 02/20/21 1457 02/20/21 1939 02/21/21 0228 02/21/21 1431 02/21/21 1825 02/22/21 0349 02/22/21 0425 02/22/21 0914  NA 121* 121*   < > 121* 119* 126* 127*  126* 127*  K 4.0 3.9   < > 4.8 5.5* 4.8 4.6  4.6 4.3  CL 79* 86*   < > 85* 84*  --  95*  95*  --   CO2 26 19*   < > 20* 19*  --  22  21*  --   BUN 33* 41*   < > 58* 60*  --  42*  43*  --   CREATININE 3.69* 4.42*   < > 5.69* 5.63*  --  3.15*  3.17*  --   CALCIUM 5.7* 7.1*   < > 6.7* 6.3*  --  7.0*  6.9*  --   MG 0.7* 2.5*  --   --  2.4  --  2.4  --   PHOS 4.7*  --   --  8.4*  --   --  4.7*  --   GLUCOSE 684* 245*   < > 127* 112*  --  81  81  --    < > = values in this interval not displayed.       [name] Matin, MS, RD, LDN, CNSC Inpatient Clinical Dietitian RD pager # available in [place]  After hours/weekend pager # available in [facility]

## 2021-02-22 NOTE — Progress Notes (Signed)
Renal panel collected by this RN at 1523 results showing in chart as collected 1152.

## 2021-02-22 NOTE — Plan of Care (Signed)
  Problem: Education: Goal: Knowledge of General Education information will improve Description: Including pain rating scale, medication(s)/side effects and non-pharmacologic comfort measures Outcome: Progressing   Problem: Health Behavior/Discharge Planning: Goal: Ability to manage health-related needs will improve Outcome: Progressing   Problem: Clinical Measurements: Goal: Ability to maintain clinical measurements within normal limits will improve Outcome: Progressing Goal: Will remain free from infection Outcome: Progressing Goal: Diagnostic test results will improve Outcome: Progressing Goal: Respiratory complications will improve Outcome: Progressing Goal: Cardiovascular complication will be avoided Outcome: Progressing   Problem: Activity: Goal: Risk for activity intolerance will decrease Outcome: Progressing   Problem: Nutrition: Goal: Adequate nutrition will be maintained Outcome: Progressing   Problem: Coping: Goal: Level of anxiety will decrease Outcome: Progressing   Problem: Elimination: Goal: Will not experience complications related to bowel motility Outcome: Progressing Goal: Will not experience complications related to urinary retention Outcome: Progressing   Problem: Pain Managment: Goal: General experience of comfort will improve Outcome: Progressing   Problem: Safety: Goal: Ability to remain free from injury will improve Outcome: Progressing   Problem: Skin Integrity: Goal: Risk for impaired skin integrity will decrease Outcome: Progressing   Problem: Activity: Goal: Ability to tolerate increased activity will improve Outcome: Progressing   Problem: Respiratory: Goal: Ability to maintain a clear airway and adequate ventilation will improve Outcome: Progressing   Problem: Role Relationship: Goal: Method of communication will improve Outcome: Progressing   Problem: Education: Goal: Knowledge of disease and its progression will  improve Outcome: Progressing   Problem: Health Behavior/Discharge Planning: Goal: Ability to manage health-related needs will improve Outcome: Progressing   Problem: Clinical Measurements: Goal: Complications related to the disease process or treatment will be avoided or minimized Outcome: Progressing Goal: Dialysis access will remain free of complications Outcome: Progressing   Problem: Activity: Goal: Activity intolerance will improve Outcome: Progressing   Problem: Fluid Volume: Goal: Fluid volume balance will be maintained or improved Outcome: Progressing   Problem: Nutritional: Goal: Ability to make appropriate dietary choices will improve Outcome: Progressing   Problem: Respiratory: Goal: Respiratory symptoms related to disease process will be avoided Outcome: Progressing   Problem: Self-Concept: Goal: Body image disturbance will be avoided or minimized Outcome: Progressing   Problem: Urinary Elimination: Goal: Progression of disease will be identified and treated Outcome: Progressing

## 2021-02-22 NOTE — Progress Notes (Signed)
Patient ID: [name], male   DOB: 01/12/1981, 40 y.o.   MRN: [id number] [facility] Progress Note   Assessment/ Plan:   1.  Acute kidney injury: Appears consistent with acute alcohol poisoning with ethyl alcohol found in hand sanitizer-likely with associated ATN.  Started on CRRT yesterday for worsening metabolic acidosis/renal function and respiratory failure with volume overload.  Problems with system clotting overnight resolved by starting on heparin. 2.  Anion gap metabolic acidosis: Likely reflective of acute kidney injury along with starvation/alcoholic ketosis.  Improving with CRRT 3.  Hypomagnesemia: Secondary to malnutrition/poor intake, this was replaced intravenously and will be followed closely while on CRRT. 4.  Hyponatremia: Hyperosmolar hyponatremia indicating unmeasured osmoles-pseudohyponatremia.  Osmolar gap suggestive of isopropyl alcohol ingestion that may be found in hand sanitizer.  Improving with CRRT at acceptable rate (8 mEq/L over the past 24 hours). 5.  Suicidal attempt with history of underlying schizophrenia: Working towards medical stabilization prior to inpatient admission for additional psychiatric help.  Subjective:   More awake/alert this morning and after circuit clotting quickly on CRRT yesterday, was started on heparin with prolonged filter life.   Objective:   BP (!) 110/59   Pulse (!) 57   Temp (!) 93.3 F (34.1 C) (Rectal)   Resp (!) 25   Ht '5\' 6"'  (1.676 m)   Wt 79.9 kg   SpO2 99%   BMI 28.43 kg/m   Intake/Output Summary (Last 24 hours) at 02/22/2021 0855 Last data filed at 02/22/2021 0800 Gross per 24 hour  Intake 1726.42 ml  Output 2107 ml  Net -380.58 ml   Weight change:   Physical Exam: Gen: Intubated, appears comfortable with intermittent movements CVS: Pulse regular rhythm, normal rate, S1 and S2 normal Resp: Anteriorly clear to auscultation, no rales/rhonchi Abd: Soft, flat, nontender, bowel sounds normal Ext: No  lower extremity edema  Imaging: DG Chest 1 View  Result Date: 02/20/2021 CLINICAL DATA:  Hypoxia EXAM: CHEST  1 VIEW COMPARISON:  Chest radiograph obtained 1 day prior FINDINGS: The heart and mediastinum are prominent, exaggerated by low lung volumes and AP technique. Lung volumes are markedly diminished, worsened since the radiograph from 1 day prior. There are streaky opacities throughout both lungs with more focal opacity in the medial right base. There is likely a small right pleural effusion, new in the interim. There is no significant left effusion. There is no appreciable pneumothorax. Plate and screw fixation of the left clavicle is again seen. There is no acute osseous abnormality. IMPRESSION: Markedly low lung volumes with new streaky opacities bilaterally and probable new small right pleural effusion. Findings suggest new pulmonary interstitial edema given rapid onset; however, infection could have a similar appearance. Electronically Signed   By: Valetta Mole MD   On: 02/20/2021 11:36   US RENAL  Result Date: 02/20/2021 CLINICAL DATA:  Acute kidney injury. History of partial left nephrectomy. EXAM: RENAL / URINARY TRACT ULTRASOUND COMPLETE COMPARISON:  Abdominal ultrasound 11/25/2017 FINDINGS: Right Kidney: Renal measurements: 10.4 x 4.7 x 6.2 cm = volume: 156 mL. Echogenicity within normal limits. No mass or hydronephrosis visualized. Small volume perinephric fluid. Left Kidney: Renal measurements: 9.2 x 4.6 x 4.8 cm = volume: 107 mL. Echogenicity within normal limits. No mass or hydronephrosis visualized. Bladder: Appears normal for degree of bladder distention. Other: As previously seen, the liver is diffusely hyperechoic compatible with steatosis. Ascites and a left pleural effusion are noted. IMPRESSION: 1.  No hydronephrosis. 2. Hepatic steatosis. 3. Ascites and left  pleural effusion. Electronically Signed   By: Logan Bores M.D.   On: 02/20/2021 13:48   DG Chest Port 1 View  Result  Date: 02/22/2021 CLINICAL DATA:  Acute respiratory failure with hypoxemia (HCC) J96.01 (ICD-10-CM) EXAM: PORTABLE CHEST 1 VIEW COMPARISON:  February 21, 2021. FINDINGS: Endotracheal tube tip projects just inferior to the clavicular heads. Right IJ sheath with the tip projecting in the expected location of the upper SVC. Gastric tube courses below the diaphragm with the tip projecting in expected region of the distal stomach and side port below the GE junction. Mildly improved lung aeration with patchy by lateral airspace opacities. No visible pleural effusions or pneumothorax on this limited single semi erect radiograph. Left axillary clips. Cardiomediastinal silhouette is within normal limits and similar to prior. IMPRESSION: 1. Mildly improved lung aeration with patchy bilateral airspace opacities, which could represent edema and/or pneumonia. 2. Support devices detailed above. Electronically Signed   By: Margaretha Sheffield MD   On: 02/22/2021 08:18   DG CHEST PORT 1 VIEW  Result Date: 02/21/2021 CLINICAL DATA:  Central venous catheter placement EXAM: PORTABLE CHEST 1 VIEW COMPARISON:  02/21/2021 FINDINGS: Endotracheal tube is seen 4.6 cm above the carina. Nasogastric tube extends into the upper abdomen beyond the margin of the examination. Right internal jugular central venous catheter has been placed with its tip overlying the expected superior vena cava medially. Pulmonary insufflation is preserved. Extensive diffuse airspace infiltrate, more severe throughout the left lung persists, asymmetric pulmonary edema versus infection. No pneumothorax or pleural effusion. Cardiac size within normal limits. No acute bone abnormality. Surgical clips are again seen bilaterally, unchanged. IMPRESSION: Right internal jugular central venous catheter tip within the superior vena cava. No pneumothorax. Otherwise stable support tubes. Stable pulmonary insufflation. Unchanged diffuse pulmonary infiltrate, infection versus edema.  Electronically Signed   By: Fidela Salisbury MD   On: 02/21/2021 12:56   DG CHEST PORT 1 VIEW  Result Date: 02/21/2021 CLINICAL DATA:  Status post intubation EXAM: PORTABLE CHEST 1 VIEW COMPARISON:  Film from earlier in the same day. FINDINGS: Cardiac shadow is stable. Persistent bilateral airspace opacities are again identified. Endotracheal tube has been placed and lies 3 cm above the carina in satisfactory position. Gastric catheter extends into the stomach. No bony abnormality is noted. IMPRESSION: Stable bilateral airspace opacities. Endotracheal tube and gastric catheter in satisfactory position. Electronically Signed   By: Inez Catalina M.D.   On: 02/21/2021 03:51   DG CHEST PORT 1 VIEW  Result Date: 02/21/2021 CLINICAL DATA:  Respiratory failure EXAM: PORTABLE CHEST 1 VIEW COMPARISON:  02/20/2021 FINDINGS: Cardiac shadow is stable. Significant increased airspace opacity is noted bilaterally left greater than right when compared with the prior exam. Again this likely represents edema. No bony abnormality is noted. Prior surgical changes in the left clavicle are seen. IMPRESSION: Significant increase in airspace opacity bilaterally left greater than right consistent with progressive edema. Electronically Signed   By: Inez Catalina M.D.   On: 02/21/2021 02:13    Labs: BMET Recent Labs  Lab 02/20/21 1054 02/20/21 1214 02/20/21 1457 02/20/21 1939 02/21/21 0228 02/21/21 0452 02/21/21 0531 02/21/21 0830 02/21/21 1431 02/21/21 1825 02/22/21 0349 02/22/21 0425  NA 125*   < > 121* 121*   < > 121* 119* 119* 121* 119* 126* 127*  126*  K 4.7   < > 4.0 3.9   < > 4.7 4.5 4.8 4.8 5.5* 4.8 4.6  4.6  CL 93*  --  79* 86*  --  84*  --   --  85* 84*  --  95*  95*  CO2 13*  --  26 19*  --  20*  --   --  20* 19*  --  22  21*  GLUCOSE 136*  --  684* 245*  --  155*  --   --  127* 112*  --  81  81  BUN 34*  --  33* 41*  --  50*  --   --  58* 60*  --  42*  43*  CREATININE 3.92*  --  3.69* 4.42*  --   5.24*  --   --  5.69* 5.63*  --  3.15*  3.17*  CALCIUM 7.3*  --  5.7* 7.1*  --  7.0*  --   --  6.7* 6.3*  --  7.0*  6.9*  PHOS 5.3*  --  4.7*  --   --   --   --   --  8.4*  --   --  4.7*   < > = values in this interval not displayed.   CBC Recent Labs  Lab 02/20/21 0130 02/20/21 0136 02/20/21 1054 02/20/21 1214 02/21/21 0452 02/21/21 0531 02/21/21 0830 02/22/21 0349 02/22/21 0425  WBC 15.9*  --  9.3  --  15.8*  --   --   --  7.4  NEUTROABS 12.9*  --   --   --   --   --   --   --  6.1  HGB 12.7*   < > 13.5   < > 12.9* 13.3 12.9* 11.2* 11.9*  HCT 37.1*   < > 38.2*   < > 35.9* 39.0 38.0* 33.0* 32.8*  MCV 87.5  --  84.9  --  82.9  --   --   --  82.6  PLT 433*  --  465*  --  443*  --   --   --  235   < > = values in this interval not displayed.   Medications:     chlorhexidine gluconate (MEDLINE KIT)  15 mL Mouth Rinse BID   Chlorhexidine Gluconate Cloth  6 each Topical Daily   heparin injection (subcutaneous)  5,000 Units Subcutaneous Q8H   mouth rinse  15 mL Mouth Rinse 10 times per day   pantoprazole (PROTONIX) IV  40 mg Intravenous QHS   sodium chloride flush  10-40 mL Intracatheter Q12H   Elmarie Shiley, MD 02/22/2021, 8:55 AM

## 2021-02-22 NOTE — Progress Notes (Signed)
NAME:  [name], MRN:  [id number], DOB:  [birth year], LOS: 2 ADMISSION DATE:  02/19/2021, CONSULTATION DATE:  8/5 REFERRING MD:  Evette Doffing, CHIEF COMPLAINT:  Dyspnea, confusion   History of Present Illness:  40 y/o male with an extensive past medical history presented to the ER with suicidal ideation ideation who drank hand sanitizer.    Pertinent  Medical History  Angina Bipolar disorder Breast Cancer Depression History of suicide attempt Hyperlipidemia Hypertension Cirrhosis Pancreatitis Schizophrenia Alcohol abuse  Significant Hospital Events: Including procedures, antibiotic start and stop dates in addition to other pertinent events   8/5 admission 8/6 ETT >  8/6 L radial arterial line >  8/6 intubated overnight for worsening hypoxemic and work of breathing, start CRRT 8/7 Making urine; had significant hypoxemia on ABG overnight, PEEP, FiO2 increased  Interim History / Subjective:   Making urine now Had significant hypoxemia on ABG overnight, PEEP, FiO2 increased  Objective   Blood pressure 98/61, pulse (!) 50, temperature (!) 93.3 F (34.1 C), temperature source Rectal, resp. rate (!) 24, height '5\' 6"'$  (1.676 m), weight 79.9 kg, SpO2 100 %.    Vent Mode: PRVC FiO2 (%):  [80 %-100 %] 90 % Set Rate:  [24 bmp] 24 bmp Vt Set:  [540 mL] 540 mL PEEP:  [8 cmH20-10 cmH20] 10 cmH20 Plateau Pressure:  [24 cmH20] 24 cmH20   Intake/Output Summary (Last 24 hours) at 02/22/2021 [id number] Last data filed at 02/22/2021 0800 Gross per 24 hour  Intake 1726.42 ml  Output 1984 ml  Net -257.58 ml   Filed Weights   02/19/21 1628 02/22/21 0500  Weight: 72.6 kg 79.9 kg    Examination:  General:  In bed on vent HENT: NCAT ETT in place PULM: Crackles B, vent supported breathing CV: RRR, no mgr GI: BS+, soft, nontender MSK: normal bulk and tone Neuro: sedated on vent but will wake up and follow commands  8/7 CXR images personally reviewed: infiltrates bilaterally have improved, ett  in place, R IJ HD cath in place  Resolved Hospital Problem list     Assessment & Plan:  Suicide attempt by drinking hand sanitizer: isopropyl alcohol poisoning; initially there was concern for ethylene glycol poisoning, however there is no evidence of this on lab work, by the patient's reported history or his family's knowledge of this; suspect he had ethyl-alcohol poisonin AKI due to the above 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 at [place] gap acidosis due to lactic and ketoacidosis and ethyl-alcohol ingestion Continue CRRT per renal Continue to monitor UOP Monitor lactic acid prn at this point  Acute respiratory failure with hypoxemia due to acute pulmonary edema from oliguria from renal failure Concern for aspiration pneumonitis vs pneumonia Full mechanical vent support: adjust PEEP/FiO2 to maintain PaO2 55-65 VAP prevention Daily WUA/SBT Levaquin 5 days  Hyponatremia: improving with CRRT Monitor BMET and UOP Replace electrolytes as needed  Need for sedation for mechanical ventilation RASS target -2 Fentanyl infusion and precedex infusion per PAD protocol Prn versed  Alcohol abuse Thiamine, folate Monitor for withdrawal  Possible septic shock with elevated lactic acid  Septic shock due to community acquired pneumonia Levaquin 5 days Wean off levophed for MAP > 65  Prognosis: guarded; high risk of inpatient death from multi-organ failure  Best Practice (right click and "Reselect all SmartList Selections" daily)   Diet/type: NPO DVT prophylaxis: prophylactic heparin  GI prophylaxis: PPI Lines: N/A Foley:  Yes, and it is still needed Code  Status:  full code Last date of multidisciplinary goals of care discussion [8/6 [place] and mother: full code; I updated her regarding the severity of his illness and high likelihood of death during this hospitalization; attempted to call her on 8/7, no answer]  Labs   CBC: Recent Labs   Lab 02/19/21 0939 02/20/21 0130 02/20/21 0136 02/20/21 1054 02/20/21 1214 02/21/21 0452 02/21/21 0531 02/21/21 0830 02/22/21 0349 02/22/21 0425  WBC 11.1* 15.9*  --  9.3  --  15.8*  --   --   --  7.4  NEUTROABS  --  12.9*  --   --   --   --   --   --   --  6.1  HGB 13.4 12.7*   < > 13.5   < > 12.9* 13.3 12.9* 11.2* 11.9*  HCT 39.2 37.1*   < > 38.2*   < > 35.9* 39.0 38.0* 33.0* 32.8*  MCV 86.3 87.5  --  84.9  --  82.9  --   --   --  82.6  PLT 429* 433*  --  465*  --  443*  --   --   --  235   < > = values in this interval not displayed.    Basic Metabolic Panel: Recent Labs  Lab 02/20/21 1054 02/20/21 1214 02/20/21 1457 02/20/21 1939 02/21/21 0228 02/21/21 0452 02/21/21 0531 02/21/21 0830 02/21/21 1431 02/21/21 1825 02/22/21 0349 02/22/21 0425  NA 125*   < > 121* 121*   < > 121*   < > 119* 121* 119* 126* 127*  126*  K 4.7   < > 4.0 3.9   < > 4.7   < > 4.8 4.8 5.5* 4.8 4.6  4.6  CL 93*  --  79* 86*  --  84*  --   --  85* 84*  --  95*  95*  CO2 13*  --  26 19*  --  20*  --   --  20* 19*  --  22  21*  GLUCOSE 136*  --  684* 245*  --  155*  --   --  127* 112*  --  81  81  BUN 34*  --  33* 41*  --  50*  --   --  58* 60*  --  42*  43*  CREATININE 3.92*  --  3.69* 4.42*  --  5.24*  --   --  5.69* 5.63*  --  3.15*  3.17*  CALCIUM 7.3*  --  5.7* 7.1*  --  7.0*  --   --  6.7* 6.3*  --  7.0*  6.9*  MG 0.9*  --  0.7* 2.5*  --   --   --   --   --  2.4  --  2.4  PHOS 5.3*  --  4.7*  --   --   --   --   --  8.4*  --   --  4.7*   < > = values in this interval not displayed.   GFR: Estimated Creatinine Clearance: 30.4 mL/min (A) (by C-G formula based on SCr of 3.17 mg/dL (H)). Recent Labs  Lab 02/20/21 0130 02/20/21 1054 02/20/21 1457 02/21/21 0452 02/21/21 0453 02/21/21 1431 02/22/21 0425  PROCALCITON  --   --  2.08  --   --   --   --   WBC 15.9* 9.3  --  15.8*  --   --  7.4  LATICACIDVEN  --   --  3.4*  --  3.7* 2.3*  --     Liver Function Tests: Recent Labs   Lab 02/19/21 0939 02/20/21 0130 02/20/21 1054 02/21/21 0452 02/21/21 1431 02/22/21 0425  AST 74* 66* 50* 56*  --  50*  ALT 88* 84* 77* 68*  --  56*  ALKPHOS 45 40 38 42  --  45  BILITOT 0.8 0.6 0.5 0.7  --  1.0  PROT 6.6 6.2* 6.1* 5.8*  --  5.4*  ALBUMIN 3.6 3.4* 3.3* 2.9* 2.5* 2.3*  2.3*   Recent Labs  Lab 02/19/21 0939 02/20/21 1457  LIPASE 32 33  AMYLASE  --  40   No results for input(s): AMMONIA in the last 168 hours.  ABG    Component Value Date/Time   PHART 7.386 02/22/2021 0349   PCO2ART 37.0 02/22/2021 0349   PO2ART 54 (L) 02/22/2021 0349   HCO3 22.4 02/22/2021 0349   TCO2 24 02/22/2021 0349   ACIDBASEDEF 3.0 (H) 02/22/2021 0349   O2SAT 89.0 02/22/2021 0349     Coagulation Profile: Recent Labs  Lab 02/20/21 1457 02/21/21 0452  INR 1.2 1.2    Cardiac Enzymes: No results for input(s): CKTOTAL, CKMB, CKMBINDEX, TROPONINI in the last 168 hours.  HbA1C: Hgb A1c MFr Bld  Date/Time Value Ref Range Status  01/08/2021 06:32 PM 4.6 (L) 4.8 - 5.6 % Final    Comment:    (NOTE) Pre diabetes:          5.7%-6.4%  Diabetes:              >6.4%  Glycemic control for   <7.0% adults with diabetes   07/30/2018 06:28 AM 4.9 4.8 - 5.6 % Final    Comment:    (NOTE) Pre diabetes:          5.7%-6.4% Diabetes:              >6.4% Glycemic control for   <7.0% adults with diabetes     CBG: Recent Labs  Lab 02/21/21 1937 02/21/21 2346 02/22/21 0410 02/22/21 0755 02/22/21 0756  GLUCAP 89 101* 73 64* 87       Critical care time: 40 minutes    Roselie Awkward, MD [facility] Pager: [phone] Cell: [phone] After 7:00 pm call Elink  [phone]

## 2021-02-22 NOTE — Progress Notes (Signed)
Hypoglycemic Event  CBG: 58  Treatment: D50 50 mL (25 gm)  Symptoms: None  Follow-up CBG: Time:1606 CBG Result:126   Possible Reasons for Event: Unknown    [name]

## 2021-02-22 NOTE — Progress Notes (Signed)
Hypoglycemic Event  CBG: 57  Treatment: D50 50 mL (25 gm)  Symptoms: None  Follow-up CBG: Time:2016 CBG Result:105  Possible Reasons for Event: Unknown  Comments/MD notified:Elink    [name] E Janny Crute

## 2021-02-23 ENCOUNTER — Inpatient Hospital Stay (HOSPITAL_COMMUNITY): Payer: Self-pay

## 2021-02-23 DIAGNOSIS — R0902 Hypoxemia: Secondary | ICD-10-CM

## 2021-02-23 LAB — COMPREHENSIVE METABOLIC PANEL
ALT: 57 U/L — ABNORMAL HIGH (ref 0–44)
AST: 53 U/L — ABNORMAL HIGH (ref 15–41)
Albumin: 2.2 g/dL — ABNORMAL LOW (ref 3.5–5.0)
Alkaline Phosphatase: 45 U/L (ref 38–126)
Anion gap: 8 (ref 5–15)
BUN: 16 mg/dL (ref 6–20)
CO2: 23 mmol/L (ref 22–32)
Calcium: 7.5 mg/dL — ABNORMAL LOW (ref 8.9–10.3)
Chloride: 102 mmol/L (ref 98–111)
Creatinine, Ser: 0.77 mg/dL (ref 0.61–1.24)
GFR, Estimated: >60 mL/min (ref >60)
Glucose, Bld: 92 mg/dL (ref 70–99)
Potassium: 3.7 mmol/L (ref 3.5–5.1)
Sodium: 133 mmol/L — ABNORMAL LOW (ref 135–145)
Total Bilirubin: 0.8 mg/dL (ref 0.3–1.2)
Total Protein: 5.2 g/dL — ABNORMAL LOW (ref 6.5–8.1)

## 2021-02-23 LAB — GLUCOSE, CAPILLARY
Glucose-Capillary: 99 mg/dL (ref 70–99)
Glucose-Capillary: 61 mg/dL — ABNORMAL LOW (ref 70–99)
Glucose-Capillary: 150 mg/dL — ABNORMAL HIGH (ref 70–99)
Glucose-Capillary: 93 mg/dL (ref 70–99)
Glucose-Capillary: 105 mg/dL — ABNORMAL HIGH (ref 70–99)
Glucose-Capillary: 75 mg/dL (ref 70–99)
Glucose-Capillary: 99 mg/dL (ref 70–99)

## 2021-02-23 LAB — CALCIUM, IONIZED
Calcium, Ionized, Serum: 3.6 mg/dL — ABNORMAL LOW (ref 4.5–5.6)
Calcium, Ionized, Serum: 3.8 mg/dL — ABNORMAL LOW (ref 4.5–5.6)

## 2021-02-23 LAB — CBC WITH DIFFERENTIAL/PLATELET
Abs Immature Granulocytes: 0 10*3/uL (ref 0.00–0.07)
Band Neutrophils: 1 %
Basophils Absolute: 0 10*3/uL (ref 0.0–0.1)
Basophils Relative: 0 %
Eosinophils Absolute: 0.2 10*3/uL (ref 0.0–0.5)
Eosinophils Relative: 3 %
HCT: 30.2 % — ABNORMAL LOW (ref 39.0–52.0)
Hemoglobin: 10.7 g/dL — ABNORMAL LOW (ref 13.0–17.0)
Lymphocytes Relative: 12 %
Lymphs Abs: 0.7 10*3/uL (ref 0.7–4.0)
MCH: 29.9 pg (ref 26.0–34.0)
MCHC: 35.4 g/dL (ref 30.0–36.0)
MCV: 84.4 fL (ref 80.0–100.0)
Monocytes Absolute: 0.1 10*3/uL (ref 0.1–1.0)
Monocytes Relative: 2 %
Neutro Abs: 5.1 10*3/uL (ref 1.7–7.7)
Neutrophils Relative %: 82 %
Platelets: 179 10*3/uL (ref 150–400)
RBC: 3.58 MIL/uL — ABNORMAL LOW (ref 4.22–5.81)
RDW: 13.3 % (ref 11.5–15.5)
WBC: 6.2 10*3/uL (ref 4.0–10.5)
nRBC: 0 % (ref 0.0–0.2)
nRBC: 0 /100 WBC

## 2021-02-23 LAB — RENAL FUNCTION PANEL
Albumin: 2.2 g/dL — ABNORMAL LOW (ref 3.5–5.0)
Anion gap: 9 (ref 5–15)
BUN: 17 mg/dL (ref 6–20)
CO2: 22 mmol/L (ref 22–32)
Calcium: 7.5 mg/dL — ABNORMAL LOW (ref 8.9–10.3)
Chloride: 101 mmol/L (ref 98–111)
Creatinine, Ser: 0.76 mg/dL (ref 0.61–1.24)
GFR, Estimated: >60 mL/min (ref >60)
Glucose, Bld: 90 mg/dL (ref 70–99)
Phosphorus: 1.1 mg/dL — ABNORMAL LOW (ref 2.5–4.6)
Potassium: 3.6 mmol/L (ref 3.5–5.1)
Sodium: 132 mmol/L — ABNORMAL LOW (ref 135–145)

## 2021-02-23 LAB — PHOSPHORUS: Phosphorus: 3.6 mg/dL (ref 2.5–4.6)

## 2021-02-23 LAB — MAGNESIUM
Magnesium: 2.3 mg/dL (ref 1.7–2.4)
Magnesium: 2 mg/dL (ref 1.7–2.4)

## 2021-02-23 MED ORDER — PROPOFOL 1000 MG/100ML IV EMUL
0.0000 ug/kg/min | INTRAVENOUS | Status: DC
  Administered 2021-02-23: 30 ug/kg/min via INTRAVENOUS
  Administered 2021-02-24: 50 ug/kg/min via INTRAVENOUS
  Administered 2021-02-24: 60 ug/kg/min via INTRAVENOUS
  Administered 2021-02-24: 70 ug/kg/min via INTRAVENOUS
  Administered 2021-02-24: 40 ug/kg/min via INTRAVENOUS
  Filled 2021-02-23 (×4): qty 100

## 2021-02-23 MED ORDER — PROSOURCE TF PO LIQD
45.0000 mL | Freq: Three times a day (TID) | ORAL | Status: DC
  Administered 2021-02-23 – 2021-02-24 (×3): 45 mL
  Filled 2021-02-23 (×3): qty 45

## 2021-02-23 MED ORDER — LAMOTRIGINE 25 MG PO TABS
25.0000 mg | ORAL_TABLET | Freq: Two times a day (BID) | ORAL | Status: DC
  Administered 2021-02-23 – 2021-02-25 (×4): 25 mg
  Filled 2021-02-23 (×4): qty 1

## 2021-02-23 MED ORDER — PANTOPRAZOLE SODIUM 40 MG PO PACK
40.0000 mg | PACK | Freq: Every day | ORAL | Status: DC
  Administered 2021-02-23: 40 mg
  Filled 2021-02-23: qty 20

## 2021-02-23 MED ORDER — VITAL 1.5 CAL PO LIQD
1000.0000 mL | ORAL | Status: DC
  Administered 2021-02-23: 1000 mL
  Filled 2021-02-23: qty 1000

## 2021-02-23 MED ORDER — SODIUM PHOSPHATES 45 MMOLE/15ML IV SOLN
45.0000 mmol | Freq: Once | INTRAVENOUS | Status: AC
  Administered 2021-02-23: 45 mmol via INTRAVENOUS
  Filled 2021-02-23: qty 15

## 2021-02-23 MED ORDER — PROPOFOL 1000 MG/100ML IV EMUL
INTRAVENOUS | Status: AC
  Administered 2021-02-23: 10 ug/kg/min via INTRAVENOUS
  Filled 2021-02-23: qty 100

## 2021-02-23 MED ORDER — ARIPIPRAZOLE 10 MG PO TABS
10.0000 mg | ORAL_TABLET | Freq: Every day | ORAL | Status: DC
  Administered 2021-02-23 – 2021-02-25 (×3): 10 mg
  Filled 2021-02-23 (×3): qty 1

## 2021-02-23 NOTE — Progress Notes (Signed)
Attending note: I have seen and examined the patient. History, labs and imaging reviewed.  Interval History:  8/8: able to come off crrt today which is great. Remains on minimal vent settings but agitation is large issue when sedation is weaned. Resuming some home meds and will go from there.    Labs reviewed, significant for hgb 10.7  Imaging: worsening LUL infiltrate, personally reviewed by me.  Assessment/plan: Acute isopropyl alcohol intoxication:  -intentional -BH consult when extubated and off continuous sedation.  -coming off crrt at this time, good urine output Bipolar I mDD Suicide attempt Polysubstance use d/o -resuming some home meds.    Acute hypoxic resp failure:  -minimal settings at this time.  -hopefully can control agitation and then can work to extubate -developing LUL infiltrate -cont abx, closely follow and certainly pulm hygiene once able to participate.   The patient is critically ill with multiple organ systems failure and requires high complexity decision making for assessment and support, frequent evaluation and titration of therapies, application of advanced monitoring technologies and extensive interpretation of multiple databases.  Critical care time 35 mins. This represents my time independent of the NP's/PA's/med student/residents time taking care of the pt. This is excluding procedures   [facility] 02/23/2021, 11:17 AM See Amion for pager If no response to pager, please call [phone] until 1900 After 1900 please call [facility] [phone]

## 2021-02-23 NOTE — Progress Notes (Signed)
Pharmacy Electrolyte Replacement  Recent Labs:  Recent Labs    02/23/21 0341  K 3.6  3.7  MG 2.3  PHOS 1.1*  CREATININE 0.76  0.77    No critical values noted.   Discussed with Eliseo Gum, NP   Plan: Na Phos 45 mmol IV  x1  Phosphorous levels already ordered   Adria Dill, PharmD PGY-1 Acute Care Resident  02/23/2021 8:19 AM

## 2021-02-23 NOTE — Discharge Summary (Signed)
Bag of patient's belongings brought to [facility] from [place] [facility] 02/23/21 @ 115PM.  Placed on table outside of RN's station.Marland Kitchen

## 2021-02-23 NOTE — Progress Notes (Signed)
Patient ID: [name], male   DOB: 04-11-1981, 40 y.o.   MRN: [id number] [facility] Progress Note   Assessment/ Plan:   1.  Acute kidney injury: Appears consistent with acute alcohol poisoning with ethyl alcohol found in hand sanitizer-likely with associated ATN.  Started on CRRT on 8/6 for worsening metabolic acidosis/renal function and respiratory failure with volume overload.  Problems with system clotting were resolved by starting on heparin. - stop CRRT and monitor for needs  2.  Anion gap metabolic acidosis: Likely reflective of acute kidney injury along with starvation/alcoholic ketosis.  Resolved with CRRT  3.  Hypomagnesemia: Secondary to malnutrition/poor intake, this was replaced intravenously; improved  4.  Hyponatremia: Hyperosmolar hyponatremia indicating unmeasured osmoles-pseudohyponatremia.  Osmolar gap suggestive of isopropyl alcohol ingestion that may be found in hand sanitizer.  Improving with CRRT   5.  Suicidal attempt with history of underlying schizophrenia: Working towards medical stabilization prior to inpatient admission for additional psychiatric help.  6. Hypophosphatemia - repletion is ordered - agree with 45 mmol  7. Normocytic anemia - mild; no acute indication for PRBC's  Subjective:   Has continued on CRRT with 2.8 liters UF over 8/7.  Had 3.0 liters UOP over 8/7.  Not able to offer additional hx  Review of systems:  Not able to obtain 2/2 mech ventilation   Objective:   BP 101/62 (BP Location: Right Arm)   Pulse 79   Temp (!) 97.1 F (36.2 C) (Axillary)   Resp (!) 21   Ht '5\' 6"'  (1.676 m)   Wt 77.9 kg   SpO2 (!) 87%   BMI 27.72 kg/m   Intake/Output Summary (Last 24 hours) at 02/23/2021 0851 Last data filed at 02/23/2021 0800 Gross per 24 hour  Intake 2961.1 ml  Output 5939 ml  Net -2977.9 ml   Weight change: -2 kg  Physical Exam:  General adult male in bed intubated HEENT normocephalic atraumatic Neck trachea  midline Lungs coarse mechanical breath sounds; FIO2 40 and PEEP 5 Heart S1S2 no rub Abdomen soft nontender nondistended Extremities no edema  Neuro - sedation currently running Access RIJ nontunneled catheter   Imaging: DG Chest Port 1 View  Result Date: 02/23/2021 CLINICAL DATA:  Acute respiratory failure with hypoxemia. EXAM: PORTABLE CHEST 1 VIEW COMPARISON:  02/22/2021 FINDINGS: Endotracheal tube is 4.7 cm above the carina. Right jugular central line is near the junction of the right internal jugular vein and right innominate vein and stable. Nasogastric tube extends in the into the abdomen but the tip is beyond the image. Again noted is bilateral airspace disease which has progressed, particularly in the left lung. Heart size is within normal limits and stable. Negative for pneumothorax. Surgical plate involving the left clavicle. IMPRESSION: 1. Increased bilateral airspace disease, particularly on the left side. 2. Support apparatuses have minimally changed. Electronically Signed   By: Markus Daft M.D.   On: 02/23/2021 07:10   DG Chest Port 1 View  Result Date: 02/22/2021 CLINICAL DATA:  Acute respiratory failure with hypoxemia (HCC) J96.01 (ICD-10-CM) EXAM: PORTABLE CHEST 1 VIEW COMPARISON:  February 21, 2021. FINDINGS: Endotracheal tube tip projects just inferior to the clavicular heads. Right IJ sheath with the tip projecting in the expected location of the upper SVC. Gastric tube courses below the diaphragm with the tip projecting in expected region of the distal stomach and side port below the GE junction. Mildly improved lung aeration with patchy by lateral airspace opacities. No visible pleural effusions or pneumothorax on  this limited single semi erect radiograph. Left axillary clips. Cardiomediastinal silhouette is within normal limits and similar to prior. IMPRESSION: 1. Mildly improved lung aeration with patchy bilateral airspace opacities, which could represent edema and/or pneumonia. 2.  Support devices detailed above. Electronically Signed   By: Margaretha Sheffield MD   On: 02/22/2021 08:18   DG CHEST PORT 1 VIEW  Result Date: 02/21/2021 CLINICAL DATA:  Central venous catheter placement EXAM: PORTABLE CHEST 1 VIEW COMPARISON:  02/21/2021 FINDINGS: Endotracheal tube is seen 4.6 cm above the carina. Nasogastric tube extends into the upper abdomen beyond the margin of the examination. Right internal jugular central venous catheter has been placed with its tip overlying the expected superior vena cava medially. Pulmonary insufflation is preserved. Extensive diffuse airspace infiltrate, more severe throughout the left lung persists, asymmetric pulmonary edema versus infection. No pneumothorax or pleural effusion. Cardiac size within normal limits. No acute bone abnormality. Surgical clips are again seen bilaterally, unchanged. IMPRESSION: Right internal jugular central venous catheter tip within the superior vena cava. No pneumothorax. Otherwise stable support tubes. Stable pulmonary insufflation. Unchanged diffuse pulmonary infiltrate, infection versus edema. Electronically Signed   By: Fidela Salisbury MD   On: 02/21/2021 12:56    Labs: BMET Recent Labs  Lab 02/20/21 1054 02/20/21 1214 02/20/21 1457 02/20/21 1939 02/21/21 0228 02/21/21 0452 02/21/21 0531 02/21/21 1431 02/21/21 1825 02/22/21 0349 02/22/21 0425 02/22/21 0914 02/22/21 1152 02/22/21 1523 02/23/21 0341  NA 125*   < > 121* 121*   < > 121*   < > 121* 119* 126* 127*  126* 127* 131*  --  132*  133*  K 4.7   < > 4.0 3.9   < > 4.7   < > 4.8 5.5* 4.8 4.6  4.6 4.3 4.0  --  3.6  3.7  CL 93*  --  79* 86*  --  84*  --  85* 84*  --  95*  95*  --  99  --  101  102  CO2 13*  --  26 19*  --  20*  --  20* 19*  --  22  21*  --  26  --  22  23  GLUCOSE 136*  --  684* 245*  --  155*  --  127* 112*  --  81  81  --  61*  --  90  92  BUN 34*  --  33* 41*  --  50*  --  58* 60*  --  42*  43*  --  28*  --  17  16  CREATININE  3.92*  --  3.69* 4.42*  --  5.24*  --  5.69* 5.63*  --  3.15*  3.17*  --  1.58*  --  0.76  0.77  CALCIUM 7.3*  --  5.7* 7.1*  --  7.0*  --  6.7* 6.3*  --  7.0*  6.9*  --  7.6*  --  7.5*  7.5*  PHOS 5.3*  --  4.7*  --   --   --   --  8.4*  --   --  4.7*  --  2.2*  3.2 2.2* 1.1*   < > = values in this interval not displayed.   CBC Recent Labs  Lab 02/20/21 0130 02/20/21 0136 02/20/21 1054 02/20/21 1214 02/21/21 0452 02/21/21 0531 02/22/21 0349 02/22/21 0425 02/22/21 0914 02/23/21 0341  WBC 15.9*  --  9.3  --  15.8*  --   --  7.4  --  6.2  NEUTROABS 12.9*  --   --   --   --   --   --  6.1  --  5.1  HGB 12.7*   < > 13.5   < > 12.9*   < > 11.2* 11.9* 10.5* 10.7*  HCT 37.1*   < > 38.2*   < > 35.9*   < > 33.0* 32.8* 31.0* 30.2*  MCV 87.5  --  84.9  --  82.9  --   --  82.6  --  84.4  PLT 433*  --  465*  --  443*  --   --  235  --  179   < > = values in this interval not displayed.   Medications:     chlorhexidine gluconate (MEDLINE KIT)  15 mL Mouth Rinse BID   Chlorhexidine Gluconate Cloth  6 each Topical Daily   feeding supplement (PROSource TF)  45 mL Per Tube BID   feeding supplement (VITAL HIGH PROTEIN)  1,000 mL Per Tube Q24H   heparin injection (subcutaneous)  5,000 Units Subcutaneous Q8H   mouth rinse  15 mL Mouth Rinse 10 times per day   pantoprazole sodium  40 mg Per Tube QHS   sodium chloride flush  10-40 mL Intracatheter Q12H   Claudia Desanctis, MD 02/23/2021, 9:05 AM

## 2021-02-23 NOTE — Progress Notes (Signed)
Poison Control updated

## 2021-02-23 NOTE — Plan of Care (Signed)
  Problem: Education: Goal: Knowledge of General Education information will improve Description: Including pain rating scale, medication(s)/side effects and non-pharmacologic comfort measures Outcome: Progressing   Problem: Health Behavior/Discharge Planning: Goal: Ability to manage health-related needs will improve Outcome: Progressing   Problem: Clinical Measurements: Goal: Ability to maintain clinical measurements within normal limits will improve Outcome: Progressing Goal: Will remain free from infection Outcome: Progressing Goal: Diagnostic test results will improve Outcome: Progressing Goal: Respiratory complications will improve Outcome: Progressing Goal: Cardiovascular complication will be avoided Outcome: Progressing   Problem: Activity: Goal: Risk for activity intolerance will decrease Outcome: Progressing   Problem: Nutrition: Goal: Adequate nutrition will be maintained Outcome: Progressing   Problem: Coping: Goal: Level of anxiety will decrease Outcome: Progressing   Problem: Elimination: Goal: Will not experience complications related to bowel motility Outcome: Progressing Goal: Will not experience complications related to urinary retention Outcome: Progressing   Problem: Pain Managment: Goal: General experience of comfort will improve Outcome: Progressing   Problem: Safety: Goal: Ability to remain free from injury will improve Outcome: Progressing   Problem: Skin Integrity: Goal: Risk for impaired skin integrity will decrease Outcome: Progressing   Problem: Activity: Goal: Ability to tolerate increased activity will improve Outcome: Progressing   Problem: Respiratory: Goal: Ability to maintain a clear airway and adequate ventilation will improve Outcome: Progressing   Problem: Role Relationship: Goal: Method of communication will improve Outcome: Progressing   Problem: Education: Goal: Knowledge of disease and its progression will  improve Outcome: Progressing   Problem: Health Behavior/Discharge Planning: Goal: Ability to manage health-related needs will improve Outcome: Progressing   Problem: Clinical Measurements: Goal: Complications related to the disease process or treatment will be avoided or minimized Outcome: Progressing Goal: Dialysis access will remain free of complications Outcome: Progressing   Problem: Activity: Goal: Activity intolerance will improve Outcome: Progressing   Problem: Fluid Volume: Goal: Fluid volume balance will be maintained or improved Outcome: Progressing   Problem: Nutritional: Goal: Ability to make appropriate dietary choices will improve Outcome: Progressing   Problem: Respiratory: Goal: Respiratory symptoms related to disease process will be avoided Outcome: Progressing   Problem: Self-Concept: Goal: Body image disturbance will be avoided or minimized Outcome: Progressing   Problem: Urinary Elimination: Goal: Progression of disease will be identified and treated Outcome: Progressing   Problem: Safety: Goal: Non-violent Restraint(s) Outcome: Progressing

## 2021-02-23 NOTE — Progress Notes (Signed)
Initial Nutrition Assessment  DOCUMENTATION CODES:   Non-severe (moderate) malnutrition in context of chronic illness  INTERVENTION:   Tube feeds via OG tube: - Vital 1.5 @ 55 ml/hr (1320 ml/day) - ProSource TF 45 ml TID  Tube feeding regimen provides 2100 kcal, 122 grams of protein, and 1008 ml of H2O.   NUTRITION DIAGNOSIS:   Moderate Malnutrition related to chronic illness (schizophrenia, bipolar 1 disorder, depression, polysubstance abuse) as evidenced by mild fat depletion, moderate muscle depletion.  GOAL:   Patient will meet greater than or equal to 90% of their needs  MONITOR:   Vent status, Labs, Weight trends, TF tolerance, I & O's  REASON FOR ASSESSMENT:   Ventilator, Consult Enteral/tube feeding initiation and management  ASSESSMENT:   40 year old male who presented to the ED on 8/04 with N/V and suicidal attempt after having consumed hand sanitizer. PMH of anxiety, depression, bipolar 1 disorder, schizophrenia, EtOH abuse, polysubstance abuse, renal cancer s/p L kidney partial nephrectomy in 2014, HTN, breast cancer. Pt admitted with acute renal failure, acute hypoxic respiratory failure.  8/06 - intubated, CRRT initiated 8/08 - CRRT stopped  Discussed pt with RN and during ICU rounds. Plan is to d/c CRRT today. Pt currently on minimal vent settings. CCM hopeful to control agitation then work towards extubation.  Consult received for tube feeding initiation and management. Pt with OG tube in stomach. RD to adjust TF to better meet pt's needs.  Reviewed weight history in chart. Pt's weight has fluctuated between 70-78 kg over the last 1 year. No real trends noted.  Patient is currently intubated on ventilator support MV: 13 L/min Temp (24hrs), Avg:97.4 F (36.3 C), Min:96.7 F (35.9 C), Max:97.8 F (36.6 C)  Drips: Precedex Fentanyl D10: 50 ml/hr  Medications reviewed and include: protonix, IV levaquin, IV sodium phosphate 45 mmol once  Labs  reviewed: sodium 132, phosphorus 1.1, elevated LFTs CBG's: 57-150 x 24 hours  NUTRITION - FOCUSED PHYSICAL EXAM:  Flowsheet Row Most Recent Value  Orbital Region Mild depletion  Upper Arm Region Mild depletion  Thoracic and Lumbar Region Mild depletion  Buccal Region Unable to assess  Temple Region Moderate depletion  Clavicle Bone Region Moderate depletion  Clavicle and Acromion Bone Region Moderate depletion  Scapular Bone Region Unable to assess  Dorsal Hand No depletion  Patellar Region No depletion  Anterior Thigh Region Mild depletion  Posterior Calf Region No depletion  Edema (RD Assessment) None  Hair Reviewed  Eyes Unable to assess  Mouth Unable to assess  Skin Reviewed  Nails Reviewed       Diet Order:   Diet Order             Diet NPO time specified  Diet effective now                   EDUCATION NEEDS:   No education needs have been identified at this time  Skin:  Skin Assessment: Reviewed RN Assessment  Last BM:  02/23/21 large type 7  Height:   Ht Readings from Last 1 Encounters:  02/19/21 '5\' 6"'$  (1.676 m)    Weight:   Wt Readings from Last 1 Encounters:  02/23/21 77.9 kg    BMI:  Body mass index is 27.72 kg/m.  Estimated Nutritional Needs:   Kcal:  1950-2150  Protein:  110-130 grams  Fluid:  >/= 2.0 L    Gustavus Bryant, MS, RD, LDN Inpatient Clinical Dietitian Please see AMiON for contact information.

## 2021-02-23 NOTE — Progress Notes (Signed)
NAME:  [name], MRN:  [id number], DOB:  08-17-1980, LOS: 3 ADMISSION DATE:  02/19/2021, CONSULTATION DATE:  8/5 REFERRING MD:  Evette Doffing, CHIEF COMPLAINT:  Dyspnea, confusion   History of Present Illness:  40 y/o male with an extensive past medical history presented to the ER with suicidal ideation ideation who drank hand sanitizer.    Pertinent  Medical History  Angina Bipolar disorder Breast Cancer Depression History of suicide attempt Hyperlipidemia Hypertension Cirrhosis Pancreatitis Schizophrenia Alcohol abuse  Significant Hospital Events: Including procedures, antibiotic start and stop dates in addition to other pertinent events   8/5 admission 8/6 ETT >  8/6 L radial arterial line >  8/6 intubated overnight for worsening hypoxemic and work of breathing, start CRRT 8/7 Making urine; had significant hypoxemia on ABG overnight, PEEP, FiO2 increased 8/8 cont to have good UOP so CRRT is even. Starting home mood stabilizers. Incr d10 gtt   Interim History / Subjective:   UOP continues. He did wake up pretty agitated with slight dexmed wean, clamped down on tube and became hypoxic. Required IV push versed   Objective   Blood pressure 110/72, pulse 84, temperature 97.8 F (36.6 C), temperature source Axillary, resp. rate (!) 27, height '5\' 6"'$  (1.676 m), weight 77.9 kg, SpO2 94 %.    Vent Mode: PRVC FiO2 (%):  [40 %-90 %] 40 % Set Rate:  [24 bmp] 24 bmp Vt Set:  [540 mL] 540 mL PEEP:  [5 cmH20-10 cmH20] 5 cmH20 Plateau Pressure:  [20 cmH20-28 cmH20] 20 cmH20   Intake/Output Summary (Last 24 hours) at 02/23/2021 0720 Last data filed at 02/23/2021 0700 Gross per 24 hour  Intake 2885.7 ml  Output 5755 ml  Net -2869.3 ml   Filed Weights   02/19/21 1628 02/22/21 0500 02/23/21 0448  Weight: 72.6 kg 79.9 kg 77.9 kg    Examination:  General: wdwn critically ill appearing middle aged M intubated sedated NAD  HENT: NCAT ETT secure anicteric sclera PULM: symmetrical  chest expansion, mechanically ventilated. Scattered rhonchi  CV: rr s1s2 no rgm cap refill brisk  GI: soft round ndnt  MSK: No acute joint deformity no cyanosis or clubbing  Neuro: sedated. Moving BUE BLE spontaneously. Intermittently follows commands. PERRL  Resolved Hospital Problem list   Shock  AGMA Ketoacidosis   Assessment & Plan:   Acute toxic metabolic encephalopathy  -isopropyl alcohol ingestion, renal failure, sepsis, CNS depressing meds for mechanical ventilation  P -correction of underlying issues as below -weaning sedation as able --goal RASS 0 to -1  Suicide Attempt: isopropyl alcohol poisoning (hand sanitizer)  Bipolar I disorder MDD with recurring SI/SA  EtOH abuse  Polysubstance use disorder (cocaine, marijuana)  P -restart home lamictal, abilify -holdgin home buspar, zoloft, PRN hydroxizine PRN trazodone  -will need psych consult when medically appropriate   Acute hypoxic respiratory failure  HCAP vs pneumonitis  Pulmonary edema  P -levaquin for total 5d course  -VAP, pulm hygiene  -Approaching appropriateness for WUA/SBT   Suspected sepsis due to PNA P -levaquin for 5d course as above -trend fever curve WBC   AKI Hx renal cell carcinoma, L partial nephrectomy (2014)  P -CRRT per nephro -- likely dc CRRT soon  -foley, strict I/O -trend renal indices   Elevated LFTs, mild P -trend LFTs PRN   Hyponatremia- mild, improving  Hypophosphatemia  P -trend bmp mag phos -replacing phos 8/8  Hypoglycemia  P -cont EN -incr D10 gtt to 64m/hr  Inadequate PO intake Hypermetabolic response  P -  EN per RDN   Prognosis: guarded; high risk of inpatient death from multi-organ failure  Best Practice (right click and "Reselect all SmartList Selections" daily)   Diet/type: NPO DVT prophylaxis: prophylactic heparin  GI prophylaxis: PPI Lines: N/A, Dialysis Catheter, and Arterial Line Foley:  Yes, and it is still needed Code Status:  full  code Last date of multidisciplinary goals of care discussion [8/6 [place] and mother: full code; I updated her regarding the severity of his illness and high likelihood of death during this hospitalization; attempted to call her on 8/7, no answer]  Labs   CBC: Recent Labs  Lab 02/20/21 0130 02/20/21 0136 02/20/21 1054 02/20/21 1214 02/21/21 0452 02/21/21 0531 02/21/21 0830 02/22/21 0349 02/22/21 0425 02/22/21 0914 02/23/21 0341  WBC 15.9*  --  9.3  --  15.8*  --   --   --  7.4  --  6.2  NEUTROABS 12.9*  --   --   --   --   --   --   --  6.1  --  5.1  HGB 12.7*   < > 13.5   < > 12.9*   < > 12.9* 11.2* 11.9* 10.5* 10.7*  HCT 37.1*   < > 38.2*   < > 35.9*   < > 38.0* 33.0* 32.8* 31.0* 30.2*  MCV 87.5  --  84.9  --  82.9  --   --   --  82.6  --  84.4  PLT 433*  --  465*  --  443*  --   --   --  235  --  179   < > = values in this interval not displayed.    Basic Metabolic Panel: Recent Labs  Lab 02/21/21 1431 02/21/21 1825 02/22/21 0349 02/22/21 0425 02/22/21 0914 02/22/21 1152 02/22/21 1523 02/23/21 0341  NA 121* 119* 126* 127*  126* 127* 131*  --  132*  133*  K 4.8 5.5* 4.8 4.6  4.6 4.3 4.0  --  3.6  3.7  CL 85* 84*  --  95*  95*  --  99  --  101  102  CO2 20* 19*  --  22  21*  --  26  --  22  23  GLUCOSE 127* 112*  --  81  81  --  61*  --  90  92  BUN 58* 60*  --  42*  43*  --  28*  --  17  16  CREATININE 5.69* 5.63*  --  3.15*  3.17*  --  1.58*  --  0.76  0.77  CALCIUM 6.7* 6.3*  --  7.0*  6.9*  --  7.6*  --  7.5*  7.5*  MG  --  2.4  --  2.4  --  2.6* 2.3 2.3  PHOS 8.4*  --   --  4.7*  --  2.2*  3.2 2.2* 1.1*   GFR: Estimated Creatinine Clearance: 119.3 mL/min (by C-G formula based on SCr of 0.76 mg/dL). Recent Labs  Lab 02/20/21 1054 02/20/21 1457 02/21/21 0452 02/21/21 0453 02/21/21 1431 02/22/21 0425 02/23/21 0341  PROCALCITON  --  2.08  --   --   --   --   --   WBC 9.3  --  15.8*  --   --  7.4 6.2  LATICACIDVEN  --  3.4*  --  3.7*  2.3*  --   --     Liver Function Tests: Recent Labs  Lab 02/20/21  0130 02/20/21 1054 02/21/21 0452 02/21/21 1431 02/22/21 0425 02/22/21 1152 02/23/21 0341  AST 66* 50* 56*  --  50*  --  53*  ALT 84* 77* 68*  --  56*  --  57*  ALKPHOS 40 38 42  --  45  --  45  BILITOT 0.6 0.5 0.7  --  1.0  --  0.8  PROT 6.2* 6.1* 5.8*  --  5.4*  --  5.2*  ALBUMIN 3.4* 3.3* 2.9* 2.5* 2.3*  2.3* 2.0* 2.2*  2.2*   Recent Labs  Lab 02/19/21 0939 02/20/21 1457  LIPASE 32 33  AMYLASE  --  40   No results for input(s): AMMONIA in the last 168 hours.  ABG    Component Value Date/Time   PHART 7.395 02/22/2021 0914   PCO2ART 38.0 02/22/2021 0914   PO2ART 98 02/22/2021 0914   HCO3 23.4 02/22/2021 0914   TCO2 25 02/22/2021 0914   ACIDBASEDEF 1.0 02/22/2021 0914   O2SAT 98.0 02/22/2021 0914     Coagulation Profile: Recent Labs  Lab 02/20/21 1457 02/21/21 0452  INR 1.2 1.2    Cardiac Enzymes: No results for input(s): CKTOTAL, CKMB, CKMBINDEX, TROPONINI in the last 168 hours.  HbA1C: Hgb A1c MFr Bld  Date/Time Value Ref Range Status  01/08/2021 06:32 PM 4.6 (L) 4.8 - 5.6 % Final    Comment:    (NOTE) Pre diabetes:          5.7%-6.4%  Diabetes:              >6.4%  Glycemic control for   <7.0% adults with diabetes   07/30/2018 06:28 AM 4.9 4.8 - 5.6 % Final    Comment:    (NOTE) Pre diabetes:          5.7%-6.4% Diabetes:              >6.4% Glycemic control for   <7.0% adults with diabetes     CBG: Recent Labs  Lab 02/22/21 1606 02/22/21 1929 02/22/21 2016 02/22/21 2333 02/23/21 0347  GLUCAP 126* 57* 105* 90 99       CRITICAL CARE Performed by: Cristal Generous   Total critical care time: 47 minutes  Critical care time was exclusive of separately billable procedures and treating other patients. Critical care was necessary to treat or prevent imminent or life-threatening deterioration.  Critical care was time spent personally by me on the following  activities: development of treatment plan with patient and/or surrogate as well as nursing, discussions with consultants, evaluation of patient's response to treatment, examination of patient, obtaining history from patient or surrogate, ordering and performing treatments and interventions, ordering and review of laboratory studies, ordering and review of radiographic studies, pulse oximetry and re-evaluation of patient's condition.   Eliseo Gum MSN, AGACNP-BC [place] for pager 02/23/2021, 7:20 AM

## 2021-02-24 ENCOUNTER — Inpatient Hospital Stay (HOSPITAL_COMMUNITY): Payer: Self-pay

## 2021-02-24 LAB — BASIC METABOLIC PANEL
Anion gap: 6 (ref 5–15)
Anion gap: 6 (ref 5–15)
BUN: 8 mg/dL (ref 6–20)
BUN: 8 mg/dL (ref 6–20)
CO2: 28 mmol/L (ref 22–32)
CO2: 27 mmol/L (ref 22–32)
Calcium: 7.9 mg/dL — ABNORMAL LOW (ref 8.9–10.3)
Calcium: 7.8 mg/dL — ABNORMAL LOW (ref 8.9–10.3)
Chloride: 102 mmol/L (ref 98–111)
Chloride: 103 mmol/L (ref 98–111)
Creatinine, Ser: 0.81 mg/dL (ref 0.61–1.24)
Creatinine, Ser: 0.74 mg/dL (ref 0.61–1.24)
GFR, Estimated: >60 mL/min (ref >60)
GFR, Estimated: >60 mL/min (ref >60)
Glucose, Bld: 105 mg/dL — ABNORMAL HIGH (ref 70–99)
Glucose, Bld: 133 mg/dL — ABNORMAL HIGH (ref 70–99)
Potassium: 3.4 mmol/L — ABNORMAL LOW (ref 3.5–5.1)
Potassium: 4.1 mmol/L (ref 3.5–5.1)
Sodium: 136 mmol/L (ref 135–145)
Sodium: 136 mmol/L (ref 135–145)

## 2021-02-24 LAB — GLUCOSE, CAPILLARY
Glucose-Capillary: 55 mg/dL — ABNORMAL LOW (ref 70–99)
Glucose-Capillary: 70 mg/dL (ref 70–99)
Glucose-Capillary: 85 mg/dL (ref 70–99)
Glucose-Capillary: 127 mg/dL — ABNORMAL HIGH (ref 70–99)
Glucose-Capillary: 132 mg/dL — ABNORMAL HIGH (ref 70–99)
Glucose-Capillary: 125 mg/dL — ABNORMAL HIGH (ref 70–99)

## 2021-02-24 LAB — RENAL FUNCTION PANEL
Albumin: 1.3 g/dL — ABNORMAL LOW (ref 3.5–5.0)
Anion gap: 3 — ABNORMAL LOW (ref 5–15)
BUN: 7 mg/dL (ref 6–20)
CO2: 18 mmol/L — ABNORMAL LOW (ref 22–32)
Calcium: 5.2 mg/dL — CL (ref 8.9–10.3)
Chloride: 117 mmol/L — ABNORMAL HIGH (ref 98–111)
Creatinine, Ser: 0.55 mg/dL — ABNORMAL LOW (ref 0.61–1.24)
GFR, Estimated: >60 mL/min (ref >60)
Glucose, Bld: 76 mg/dL (ref 70–99)
Phosphorus: <1 mg/dL — CL (ref 2.5–4.6)
Potassium: <2 mmol/L — CL (ref 3.5–5.1)
Sodium: 138 mmol/L (ref 135–145)

## 2021-02-24 LAB — PHOSPHORUS
Phosphorus: 3.1 mg/dL (ref 2.5–4.6)
Phosphorus: 3.5 mg/dL (ref 2.5–4.6)

## 2021-02-24 LAB — MAGNESIUM
Magnesium: 1.3 mg/dL — ABNORMAL LOW (ref 1.7–2.4)
Magnesium: 2.2 mg/dL (ref 1.7–2.4)
Magnesium: 2.2 mg/dL (ref 1.7–2.4)

## 2021-02-24 LAB — TRIGLYCERIDES: Triglycerides: 267 mg/dL — ABNORMAL HIGH (ref <150)

## 2021-02-24 MED ORDER — POTASSIUM PHOSPHATES 15 MMOLE/5ML IV SOLN
30.0000 mmol | Freq: Once | INTRAVENOUS | Status: AC
  Administered 2021-02-24: 30 mmol via INTRAVENOUS
  Filled 2021-02-24: qty 10

## 2021-02-24 MED ORDER — DIPHENHYDRAMINE HCL 50 MG/ML IJ SOLN
12.5000 mg | Freq: Once | INTRAMUSCULAR | Status: AC
  Administered 2021-02-24: 12.5 mg via INTRAVENOUS
  Filled 2021-02-24: qty 1

## 2021-02-24 MED ORDER — POTASSIUM CHLORIDE 20 MEQ PO PACK
40.0000 meq | PACK | Freq: Once | ORAL | Status: AC
  Administered 2021-02-24: 40 meq
  Filled 2021-02-24: qty 2

## 2021-02-24 MED ORDER — HYDROXYZINE HCL 25 MG PO TABS
50.0000 mg | ORAL_TABLET | Freq: Three times a day (TID) | ORAL | Status: DC | PRN
  Administered 2021-02-24: 50 mg
  Filled 2021-02-24: qty 2

## 2021-02-24 MED ORDER — SIMETHICONE 80 MG PO CHEW
160.0000 mg | CHEWABLE_TABLET | Freq: Four times a day (QID) | ORAL | Status: DC | PRN
  Administered 2021-02-25: 160 mg via ORAL
  Filled 2021-02-24 (×2): qty 2

## 2021-02-24 MED ORDER — BUSPIRONE HCL 5 MG PO TABS
10.0000 mg | ORAL_TABLET | Freq: Three times a day (TID) | ORAL | Status: DC
  Administered 2021-02-24 – 2021-02-25 (×4): 10 mg
  Filled 2021-02-24 (×4): qty 2

## 2021-02-24 MED ORDER — ORAL CARE MOUTH RINSE
15.0000 mL | Freq: Two times a day (BID) | OROMUCOSAL | Status: DC
  Administered 2021-02-25 – 2021-02-26 (×4): 15 mL via OROMUCOSAL

## 2021-02-24 MED ORDER — CHLORHEXIDINE GLUCONATE 0.12 % MT SOLN
15.0000 mL | Freq: Two times a day (BID) | OROMUCOSAL | Status: DC
  Administered 2021-02-25 – 2021-02-27 (×4): 15 mL via OROMUCOSAL
  Filled 2021-02-24 (×2): qty 15

## 2021-02-24 MED ORDER — FUROSEMIDE 10 MG/ML IJ SOLN
40.0000 mg | Freq: Once | INTRAMUSCULAR | Status: AC
  Administered 2021-02-24: 40 mg via INTRAVENOUS
  Filled 2021-02-24: qty 4

## 2021-02-24 MED ORDER — MAGNESIUM SULFATE 2 GM/50ML IV SOLN
2.0000 g | Freq: Once | INTRAVENOUS | Status: AC
  Administered 2021-02-24: 2 g via INTRAVENOUS
  Filled 2021-02-24: qty 50

## 2021-02-24 MED ORDER — HYDROXYZINE HCL 25 MG PO TABS
50.0000 mg | ORAL_TABLET | Freq: Four times a day (QID) | ORAL | Status: DC | PRN
  Administered 2021-02-25: 50 mg via ORAL
  Filled 2021-02-24 (×2): qty 2

## 2021-02-24 MED ORDER — PANTOPRAZOLE SODIUM 40 MG PO TBEC: 40.0000 mg | DELAYED_RELEASE_TABLET | Freq: Every day | ORAL | Status: DC

## 2021-02-24 NOTE — Progress Notes (Signed)
Patient ID: [name], male   DOB: May 14, 1981, 40 y.o.   MRN: [id number]  [facility] Progress Note   Assessment/ Plan:   1.  Acute kidney injury: Appears consistent with acute alcohol poisoning with ethyl alcohol found in hand sanitizer-likely with associated ATN.  Started on CRRT on 8/6 for worsening metabolic acidosis/renal function and respiratory failure with volume overload.  Problems with system clotting were resolved by starting on heparin.  CRRT stopped on 8/8 after clearance and UOP much improved - stable off of CRRT - order to remove nontunneled HD catheter  2.  Anion gap metabolic acidosis: Likely reflective of acute kidney injury along with starvation/alcoholic ketosis.  Resolved with CRRT  3.  Hypomagnesemia: Secondary to malnutrition/poor intake, this was replaced intravenously; resolved with repletion  4.  Hyponatremia: Hyperosmolar hyponatremia indicating unmeasured osmoles-pseudohyponatremia.  Osmolar gap suggestive of isopropyl alcohol ingestion that may be found in hand sanitizer.  improved with CRRT   5.  Suicidal attempt with history of underlying schizophrenia: pending medical stabilization prior to inpatient admission for additional psychiatric help.  6. Hypophosphatemia - resolved with repletion  7. Normocytic anemia - mild; no acute indication for [facility]  Nephrology will sign off.  Please do not hesitate to contact me with any questions.      Subjective:   CRRT stopped on 8/8 per my order.  He had 3.1 liters UOP over 8/8.  He had 132 mL UF with CRRT over 8/8 prior to stopping.  he has been on D10 at 50 ml/hr.   Review of systems:   Not able to obtain 2/2 mech ventilation   Objective:   BP (!) 142/87 (BP Location: Right Arm)   Pulse (!) 132   Temp 98.7 F (37.1 C) (Axillary)   Resp 16   Ht _0  (1.676 m)   Wt 78.5 kg   SpO2 100%   BMI 27.93 kg/m   Intake/Output Summary (Last 24 hours) at 02/24/2021 0854 Last data filed at 02/24/2021  0830 Gross per 24 hour  Intake 4495.12 ml  Output 3235 ml  Net 1260.12 ml   Weight change: 0.6 kg  Physical Exam:  General adult male in bed intubated HEENT normocephalic atraumatic Neck trachea midline Lungs coarse mechanical breath sounds; FIO2 40 and PEEP 5 Heart S1S2 no rub Abdomen soft nontender nondistended Extremities no edema  Neuro - sedation currently running Access RIJ nontunneled catheter is in place   Imaging: DG Chest Port 1 View  Result Date: 02/23/2021 CLINICAL DATA:  Acute respiratory failure with hypoxemia. EXAM: PORTABLE CHEST 1 VIEW COMPARISON:  02/22/2021 FINDINGS: Endotracheal tube is 4.7 cm above the carina. Right jugular central line is near the junction of the right internal jugular vein and right innominate vein and stable. Nasogastric tube extends in the into the abdomen but the tip is beyond the image. Again noted is bilateral airspace disease which has progressed, particularly in the left lung. Heart size is within normal limits and stable. Negative for pneumothorax. Surgical plate involving the left clavicle. IMPRESSION: 1. Increased bilateral airspace disease, particularly on the left side. 2. Support apparatuses have minimally changed. Electronically Signed   By: Markus Daft M.D.   On: 02/23/2021 07:10    Labs: BMET Recent Labs  Lab 02/21/21 1431 02/21/21 1825 02/22/21 0349 02/22/21 0425 02/22/21 0914 02/22/21 1152 02/22/21 1523 02/23/21 0341 02/23/21 1600 02/24/21 0543 02/24/21 0802  NA 121* 119* 126* 127*  126* 127* 131*  --  132*  133*  --  138 136  K 4.8 5.5* 4.8 4.6  4.6 4.3 4.0  --  3.6  3.7  --  <2.0* 3.4*  CL 85* 84*  --  95*  95*  --  99  --  101  102  --  117* 102  CO2 20* 19*  --  22  21*  --  26  --  22  23  --  18* 28  GLUCOSE 127* 112*  --  81  81  --  61*  --  90  92  --  76 105*  BUN 58* 60*  --  42*  43*  --  28*  --  17  16  --  7 8  CREATININE 5.69* 5.63*  --  3.15*  3.17*  --  1.58*  --  0.76  0.77  --   0.55* 0.81  CALCIUM 6.7* 6.3*  --  7.0*  6.9*  --  7.6*  --  7.5*  7.5*  --  5.2* 7.9*  PHOS 8.4*  --   --  4.7*  --  2.2*  3.2 2.2* 1.1* 3.6 <1.0* 3.1   CBC Recent Labs  Lab 02/20/21 0130 02/20/21 0136 02/20/21 1054 02/20/21 1214 02/21/21 0452 02/21/21 0531 02/22/21 0349 02/22/21 0425 02/22/21 0914 02/23/21 0341  WBC 15.9*  --  9.3  --  15.8*  --   --  7.4  --  6.2  NEUTROABS 12.9*  --   --   --   --   --   --  6.1  --  5.1  HGB 12.7*   < > 13.5   < > 12.9*   < > 11.2* 11.9* 10.5* 10.7*  HCT 37.1*   < > 38.2*   < > 35.9*   < > 33.0* 32.8* 31.0* 30.2*  MCV 87.5  --  84.9  --  82.9  --   --  82.6  --  84.4  PLT 433*  --  465*  --  443*  --   --  235  --  179   < > = values in this interval not displayed.   Medications:     ARIPiprazole  10 mg Per Tube Daily   chlorhexidine gluconate (MEDLINE KIT)  15 mL Mouth Rinse BID   Chlorhexidine Gluconate Cloth  6 each Topical Daily   feeding supplement (PROSource TF)  45 mL Per Tube TID   heparin injection (subcutaneous)  5,000 Units Subcutaneous Q8H   lamoTRIgine  25 mg Per Tube BID   mouth rinse  15 mL Mouth Rinse 10 times per day   pantoprazole sodium  40 mg Per Tube QHS   sodium chloride flush  10-40 mL Intracatheter Q12H   Claudia Desanctis, MD 02/24/2021, 9:11 AM

## 2021-02-24 NOTE — Procedures (Signed)
Extubation Procedure Note  Patient Details:   Name: [name] DOB: 05-31-1981 MRN: [id number]   Airway Documentation:    Vent end date: 02/24/21 Vent end time: 1411   Evaluation  O2 sats: stable throughout Complications: No apparent complications Patient did tolerate procedure well. Bilateral Breath Sounds: Clear, Diminished   Yes  Positive cuff leak noted.  Patient placed on [place] 4L with humidity. No stridor noted. Patient able to reach 375 mL using the incentive spirometer.  [name] 02/24/2021, 2:23 PM

## 2021-02-24 NOTE — Progress Notes (Signed)
OVERNIGHT COVERAGE CRITICAL CARE PROGRESS NOTE  CTSP re: hypoxia.  Currently, on HFNC, SpO2 90%.  Improves significantly with addition on NRB mask.  Mouth-breather?  Awake, alert.  Follows commands.  Reports pruritus and abdominal bloating.  CXR reveals significant gas accumulation (stomach vs colonic distention at the splenic flexure).  On exam, good air entry bilaterally. Scattered rales and rhonchi.  Abdomen is distended without tenderness or rebound.  Continue HFNC.  NRB mask as needed. KUB. OOB to chair. Benadryl for pruritus.  Renee Pain, MD Board Certified by the ABIM, [place]

## 2021-02-24 NOTE — Progress Notes (Signed)
eLink Physician-Brief Progress Note Patient Name: [name] DOB: 07-18-1981 MRN: [id number]   Date of Service  02/24/2021  HPI/Events of Note  Multiple issues: 1. Gas pain and 2. Itching.   eICU Interventions  Plan: Mylicon 0000000 mg PO Q 6 hours PRN gas pain. Hydroxyzine 50 mg PO Q 6 hours PRN itching or anxiety.      Intervention Category Major Interventions: Other:  Lysle Dingwall 02/24/2021, 11:58 PM

## 2021-02-24 NOTE — Progress Notes (Signed)
Patient has strong, non-productive couth; lungs clear and diminished in bases; sats 72% on 4L ; Dr Coralee Pesa notified and en route to bedside; will continue to monitor patient.

## 2021-02-24 NOTE — Progress Notes (Signed)
Patient placed on Heated HFNC 30L 100% FIO2 due to desat.  Sats recovered to 92%. RT will continue to monitor.

## 2021-02-24 NOTE — Progress Notes (Signed)
Date and time results received: 02/24/21 0620 6:32 AM      Test: K+ <2, Ca 5.2, Phos <1, Mag1.3 Critical Value:   Name of Provider Notified: Elink  Orders Received? Or Actions Taken?: awaiting orderers

## 2021-02-24 NOTE — Progress Notes (Signed)
NAME:  [name], MRN:  [id number], DOB:  05-Oct-1980, LOS: 4 ADMISSION DATE:  02/19/2021, CONSULTATION DATE:  8/5 REFERRING MD:  Evette Doffing, CHIEF COMPLAINT:  Dyspnea, confusion   History of Present Illness:  40 y/o male with an extensive past medical history presented to the ER with suicidal ideation ideation who drank hand sanitizer.    Pertinent  Medical History  Angina Bipolar disorder Breast Cancer Depression History of suicide attempt Hyperlipidemia Hypertension Cirrhosis Pancreatitis Schizophrenia Alcohol abuse  Significant Hospital Events: Including procedures, antibiotic start and stop dates in addition to other pertinent events   8/5 admission 8/6 ETT >  8/6 L radial arterial line >  8/6 intubated overnight for worsening hypoxemic and work of breathing, start CRRT 8/7 Making urine; had significant hypoxemia on ABG overnight, PEEP, FiO2 increased 8/8 cont to have good UOP so CRRT is even. Starting home mood stabilizers. Incr d10 gtt  8/9 Restart buspirone and hydroxyzine. A line removed Interim History / Subjective:  8/9: UOP improved.  Fever to 101.5 last night.  Still agitated this morning.  Weaning sedation. Hypo-K, Mag, Phos repleted this morning.   Objective   Blood pressure (!) 142/87, pulse (!) 132, temperature 98.7 F (37.1 C), temperature source Axillary, resp. rate 16, height '5\' 6"'$  (1.676 m), weight 78.5 kg, SpO2 100 %.    Vent Mode: PRVC FiO2 (%):  [40 %-70 %] 50 % Set Rate:  [24 bmp] 24 bmp Vt Set:  [540 mL] 540 mL PEEP:  [5 cmH20] 5 cmH20 Plateau Pressure:  [22 cmH20-29 cmH20] 29 cmH20   Intake/Output Summary (Last 24 hours) at 02/24/2021 0936 Last data filed at 02/24/2021 0830 Gross per 24 hour  Intake 4357.57 ml  Output 3010 ml  Net 1347.57 ml    Filed Weights   02/22/21 0500 02/23/21 0448 02/24/21 0454  Weight: 79.9 kg 77.9 kg 78.5 kg    Examination:  General: critically ill appearing middle aged M intubated sedated NAD  HENT: NCAT,  ETT in place PULM: symmetrical chest expansion, mechanically ventilated. No crackles or wheezes.  CV: RRR, no murmurs or gallops. Cap refill < 2 secs.  GI: soft, nondistended.  MSK: decreased bulk, no lower extremity edema.  Neuro: sedated. Moving BUE BLE spontaneously. Intermittently follows commands.  Resolved Hospital Problem list   Shock  AGMA Ketoacidosis   Assessment & Plan:   Acute toxic metabolic encephalopathy: isopropyl alcohol ingestion, renal failure, sepsis, CNS depressing meds for mechanical ventilation  -correction of underlying issues as below -weaning sedation as able --goal RASS 0 to -1  Suicide Attempt: isopropyl alcohol poisoning (hand sanitizer)  Bipolar I disorder MDD with recurring SI/SA  EtOH abuse  Polysubstance use disorder (cocaine, marijuana)  -restart home lamictal, abilify -holdgin home buspar, zoloft, PRN hydroxizine PRN trazodone  -will need psych consult when medically appropriate   Acute hypoxic respiratory failureHCAP vs aspiration pneumoniaPulmonary edema  -levaquin for total 5d course  -VAP, pulm hygiene  -Approaching appropriateness for WUA/SBT  -Weaning sedation as able  Suspected sepsis due to PNAFever -levaquin for 5d course as above -trend fever curve WBC   Acute Kidney InjuryHx renal cell carcinoma, L partial nephrectomy (2014)  - Continue to trend UOP off CRRT 8/7   -foley, strict I/O -trend renal indices   Elevated LFTs, mild -trend LFTs PRN   Electrolyte imbalances HyponatremiaHypokalemiaHypomagnesemia Hypophosphatemia  -trend bmp mag phos -replete prn  Hypoglycemia  -cont EN -continue D10 gtt   Inadequate PO intakeHypermetabolic response  -EN per RDN  Prognosis: guarded; high risk of inpatient death from multi-organ failure  Best Practice (right click and "Reselect all SmartList Selections" daily)   Diet/type: NPO DVT prophylaxis: prophylactic heparin  GI prophylaxis: PPI Lines: Dialysis Catheter   Foley:  Yes, and it is still needed Code Status:  full code Last date of multidisciplinary goals of care discussion [8/6 [place] and mother: full code; I updated her regarding the severity of his illness and high likelihood of death during this hospitalization; attempted to call her on 8/7, no answer]  Labs   CBC: Recent Labs  Lab 02/20/21 0130 02/20/21 0136 02/20/21 1054 02/20/21 1214 02/21/21 0452 02/21/21 0531 02/21/21 0830 02/22/21 0349 02/22/21 0425 02/22/21 0914 02/23/21 0341  WBC 15.9*  --  9.3  --  15.8*  --   --   --  7.4  --  6.2  NEUTROABS 12.9*  --   --   --   --   --   --   --  6.1  --  5.1  HGB 12.7*   < > 13.5   < > 12.9*   < > 12.9* 11.2* 11.9* 10.5* 10.7*  HCT 37.1*   < > 38.2*   < > 35.9*   < > 38.0* 33.0* 32.8* 31.0* 30.2*  MCV 87.5  --  84.9  --  82.9  --   --   --  82.6  --  84.4  PLT 433*  --  465*  --  443*  --   --   --  235  --  179   < > = values in this interval not displayed.     Basic Metabolic Panel: Recent Labs  Lab 02/22/21 0425 02/22/21 0914 02/22/21 1152 02/22/21 1523 02/23/21 0341 02/23/21 1600 02/24/21 0543 02/24/21 0802  NA 127*  126* 127* 131*  --  132*  133*  --  138 136  K 4.6  4.6 4.3 4.0  --  3.6  3.7  --  <2.0* 3.4*  CL 95*  95*  --  99  --  101  102  --  117* 102  CO2 22  21*  --  26  --  22  23  --  18* 28  GLUCOSE 81  81  --  61*  --  90  92  --  76 105*  BUN 42*  43*  --  28*  --  17  16  --  7 8  CREATININE 3.15*  3.17*  --  1.58*  --  0.76  0.77  --  0.55* 0.81  CALCIUM 7.0*  6.9*  --  7.6*  --  7.5*  7.5*  --  5.2* 7.9*  MG 2.4  --  2.6* 2.3 2.3 2.0 1.3* 2.2  PHOS 4.7*  --  2.2*  3.2 2.2* 1.1* 3.6 <1.0* 3.1    GFR: Estimated Creatinine Clearance: 118.3 mL/min (by C-G formula based on SCr of 0.81 mg/dL). Recent Labs  Lab 02/20/21 1054 02/20/21 1457 02/21/21 0452 02/21/21 0453 02/21/21 1431 02/22/21 0425 02/23/21 0341  PROCALCITON  --  2.08  --   --   --   --   --   WBC 9.3  --  15.8*   --   --  7.4 6.2  LATICACIDVEN  --  3.4*  --  3.7* 2.3*  --   --      Liver Function Tests: Recent Labs  Lab 02/20/21 0130 02/20/21 1054 02/21/21 0452 02/21/21 1431 02/22/21 0425  02/22/21 1152 02/23/21 0341 02/24/21 0543  AST 66* 50* 56*  --  50*  --  53*  --   ALT 84* 77* 68*  --  56*  --  57*  --   ALKPHOS 40 38 42  --  45  --  45  --   BILITOT 0.6 0.5 0.7  --  1.0  --  0.8  --   PROT 6.2* 6.1* 5.8*  --  5.4*  --  5.2*  --   ALBUMIN 3.4* 3.3* 2.9* 2.5* 2.3*  2.3* 2.0* 2.2*  2.2* 1.3*    Recent Labs  Lab 02/19/21 0939 02/20/21 1457  LIPASE 32 33  AMYLASE  --  40    No results for input(s): AMMONIA in the last 168 hours.  ABG    Component Value Date/Time   PHART 7.395 02/22/2021 0914   PCO2ART 38.0 02/22/2021 0914   PO2ART 98 02/22/2021 0914   HCO3 23.4 02/22/2021 0914   TCO2 25 02/22/2021 0914   ACIDBASEDEF 1.0 02/22/2021 0914   O2SAT 98.0 02/22/2021 0914      Coagulation Profile: Recent Labs  Lab 02/20/21 1457 02/21/21 0452  INR 1.2 1.2     Cardiac Enzymes: No results for input(s): CKTOTAL, CKMB, CKMBINDEX, TROPONINI in the last 168 hours.  HbA1C: Hgb A1c MFr Bld  Date/Time Value Ref Range Status  01/08/2021 06:32 PM 4.6 (L) 4.8 - 5.6 % Final    Comment:    (NOTE) Pre diabetes:          5.7%-6.4%  Diabetes:              >6.4%  Glycemic control for   <7.0% adults with diabetes   07/30/2018 06:28 AM 4.9 4.8 - 5.6 % Final    Comment:    (NOTE) Pre diabetes:          5.7%-6.4% Diabetes:              >6.4% Glycemic control for   <7.0% adults with diabetes     CBG: Recent Labs  Lab 02/23/21 1936 02/23/21 2311 02/24/21 0335 02/24/21 0400 02/24/21 0719  GLUCAP 75 99 55* 70 85        Critical care time: 35 minutes  [facility] 02/24/2021, 1:42 PM See Amion for pager If no response to pager, please call [phone] until 1900 After 1900 please call [facility] [phone]

## 2021-02-24 NOTE — Progress Notes (Addendum)
eLink Physician-Brief Progress Note Patient Name: [name] DOB: 1981-05-01 MRN: [id number]   Date of Service  02/24/2021  HPI/Events of Note  Patient extubated earlier today. Nursing concerned about hypoxia. Sat = 70% on 100% HFNC. NRB added. Sat now = 94% and RR = 22. CXR this AM reveals progression of bilateral airspace disease. No pneumothorax.  Creatinine = 0.74.  eICU Interventions  Plan: Lasix 40 mg IV X 1 now. Will ask PCCM ground team to evaluate the patient at bedside.     Intervention Category Major Interventions: Hypoxemia - evaluation and management  [name] 02/24/2021, 7:35 PM

## 2021-02-24 NOTE — Progress Notes (Signed)
eLink Physician-Brief Progress Note Patient Name: [name] DOB: 1981/05/19 MRN: [id number]   Date of Service  02/24/2021  HPI/Events of Note  Hypokalemia  Hypophosphatemia  Hypomagnesemia - K+ < 2.0, PO4--- = < 1.0 and Mg++ = 1.3.  eICU Interventions  Plan: Will replace K+, PO4--- and Mg++. Repeat BMP, Phosphorus and Magnesium at 2 PM.     Intervention Category Major Interventions: Electrolyte abnormality - evaluation and management  Jaclynne Baldo Eugene 02/24/2021, 6:38 AM

## 2021-02-25 ENCOUNTER — Inpatient Hospital Stay (HOSPITAL_COMMUNITY): Payer: Self-pay

## 2021-02-25 DIAGNOSIS — J9601 Acute respiratory failure with hypoxia: Secondary | ICD-10-CM

## 2021-02-25 DIAGNOSIS — T1491XA Suicide attempt, initial encounter: Secondary | ICD-10-CM

## 2021-02-25 LAB — POCT I-STAT 7, (LYTES, BLD GAS, ICA,H+H)
Acid-Base Excess: 5 mmol/L — ABNORMAL HIGH (ref 0.0–2.0)
Bicarbonate: 33.3 mmol/L — ABNORMAL HIGH (ref 20.0–28.0)
Calcium, Ion: 1.17 mmol/L (ref 1.15–1.40)
HCT: 43 % (ref 39.0–52.0)
Hemoglobin: 14.6 g/dL (ref 13.0–17.0)
O2 Saturation: 100 %
Potassium: 3.8 mmol/L (ref 3.5–5.1)
Sodium: 131 mmol/L — ABNORMAL LOW (ref 135–145)
TCO2: 35 mmol/L — ABNORMAL HIGH (ref 22–32)
pCO2 arterial: 62.9 mmHg — ABNORMAL HIGH (ref 32.0–48.0)
pH, Arterial: 7.331 — ABNORMAL LOW (ref 7.350–7.450)
pO2, Arterial: 302 mmHg — ABNORMAL HIGH (ref 83.0–108.0)

## 2021-02-25 LAB — RENAL FUNCTION PANEL
Albumin: 2.7 g/dL — ABNORMAL LOW (ref 3.5–5.0)
Anion gap: 10 (ref 5–15)
BUN: 5 mg/dL — ABNORMAL LOW (ref 6–20)
CO2: 29 mmol/L (ref 22–32)
Calcium: 8.7 mg/dL — ABNORMAL LOW (ref 8.9–10.3)
Chloride: 93 mmol/L — ABNORMAL LOW (ref 98–111)
Creatinine, Ser: 0.69 mg/dL (ref 0.61–1.24)
GFR, Estimated: >60 mL/min (ref >60)
Glucose, Bld: 107 mg/dL — ABNORMAL HIGH (ref 70–99)
Phosphorus: 2.6 mg/dL (ref 2.5–4.6)
Potassium: 4.2 mmol/L (ref 3.5–5.1)
Sodium: 132 mmol/L — ABNORMAL LOW (ref 135–145)

## 2021-02-25 LAB — CULTURE, BLOOD (ROUTINE X 2)
Culture: NO GROWTH
Culture: NO GROWTH
Special Requests: ADEQUATE

## 2021-02-25 LAB — GLUCOSE, CAPILLARY
Glucose-Capillary: 117 mg/dL — ABNORMAL HIGH (ref 70–99)
Glucose-Capillary: 117 mg/dL — ABNORMAL HIGH (ref 70–99)
Glucose-Capillary: 121 mg/dL — ABNORMAL HIGH (ref 70–99)
Glucose-Capillary: 138 mg/dL — ABNORMAL HIGH (ref 70–99)
Glucose-Capillary: 145 mg/dL — ABNORMAL HIGH (ref 70–99)
Glucose-Capillary: 157 mg/dL — ABNORMAL HIGH (ref 70–99)

## 2021-02-25 LAB — ECHOCARDIOGRAM COMPLETE BUBBLE STUDY: Area-P 1/2: 2.32 cm2

## 2021-02-25 LAB — PHOSPHORUS: Phosphorus: 3.3 mg/dL (ref 2.5–4.6)

## 2021-02-25 LAB — MAGNESIUM
Magnesium: 1.5 mg/dL — ABNORMAL LOW (ref 1.7–2.4)
Magnesium: 2 mg/dL (ref 1.7–2.4)

## 2021-02-25 MED ORDER — ROCURONIUM BROMIDE 10 MG/ML (PF) SYRINGE
PREFILLED_SYRINGE | INTRAVENOUS | Status: AC
  Administered 2021-02-25: 100 mg
  Filled 2021-02-25: qty 10

## 2021-02-25 MED ORDER — SODIUM CHLORIDE 0.9 % IV SOLN
0.5000 mg/kg/h | INTRAVENOUS | Status: DC
  Filled 2021-02-25: qty 5

## 2021-02-25 MED ORDER — FENTANYL CITRATE (PF) 100 MCG/2ML IJ SOLN: 50.0000 ug | Freq: Once | INTRAMUSCULAR | Status: AC

## 2021-02-25 MED ORDER — PANTOPRAZOLE SODIUM 40 MG PO PACK
40.0000 mg | PACK | Freq: Every day | ORAL | Status: DC
  Administered 2021-02-25 – 2021-02-28 (×4): 40 mg
  Filled 2021-02-25 (×4): qty 20

## 2021-02-25 MED ORDER — BOOST / RESOURCE BREEZE PO LIQD CUSTOM
1.0000 | Freq: Three times a day (TID) | ORAL | Status: DC
  Administered 2021-02-25 (×2): 1 via ORAL

## 2021-02-25 MED ORDER — THIAMINE HCL 100 MG PO TABS
100.0000 mg | ORAL_TABLET | Freq: Every day | ORAL | Status: DC
  Administered 2021-02-25 – 2021-03-01 (×5): 100 mg
  Filled 2021-02-25 (×6): qty 1

## 2021-02-25 MED ORDER — FENTANYL 2500MCG IN NS 250ML (10MCG/ML) PREMIX INFUSION
50.0000 ug/h | INTRAVENOUS | Status: DC
  Administered 2021-02-25: 50 ug/h via INTRAVENOUS
  Administered 2021-02-25 – 2021-02-26 (×5): 400 ug/h via INTRAVENOUS
  Administered 2021-02-27 (×2): 200 ug/h via INTRAVENOUS
  Administered 2021-02-27: 400 ug/h via INTRAVENOUS
  Filled 2021-02-25 (×10): qty 250

## 2021-02-25 MED ORDER — SIMETHICONE 80 MG PO CHEW
160.0000 mg | CHEWABLE_TABLET | Freq: Four times a day (QID) | ORAL | Status: DC | PRN
  Administered 2021-03-01: 160 mg
  Filled 2021-02-25: qty 2

## 2021-02-25 MED ORDER — POLYETHYLENE GLYCOL 3350 17 G PO PACK: 17.0000 g | PACK | Freq: Every day | ORAL | Status: DC | PRN

## 2021-02-25 MED ORDER — HYDROXYZINE HCL 25 MG PO TABS
50.0000 mg | ORAL_TABLET | Freq: Four times a day (QID) | ORAL | Status: DC | PRN
  Administered 2021-02-26: 50 mg
  Filled 2021-02-25: qty 2

## 2021-02-25 MED ORDER — PROPOFOL 1000 MG/100ML IV EMUL
0.0000 ug/kg/min | INTRAVENOUS | Status: DC
Start: 2021-02-25 — End: 2021-02-26
  Administered 2021-02-25: 50 ug/kg/min via INTRAVENOUS
  Administered 2021-02-25: 10 ug/kg/min via INTRAVENOUS
  Administered 2021-02-25 – 2021-02-26 (×3): 60 ug/kg/min via INTRAVENOUS
  Filled 2021-02-25 (×3): qty 100
  Filled 2021-02-25: qty 200

## 2021-02-25 MED ORDER — FENTANYL CITRATE (PF) 100 MCG/2ML IJ SOLN
INTRAMUSCULAR | Status: AC
  Administered 2021-02-25: 100 ug
  Filled 2021-02-25: qty 2

## 2021-02-25 MED ORDER — ADULT MULTIVITAMIN W/MINERALS CH: 1.0000 | ORAL_TABLET | Freq: Every day | ORAL | Status: DC

## 2021-02-25 MED ORDER — ADULT MULTIVITAMIN W/MINERALS CH
1.0000 | ORAL_TABLET | Freq: Every day | ORAL | Status: DC
  Administered 2021-02-25 – 2021-03-01 (×5): 1
  Filled 2021-02-25 (×6): qty 1

## 2021-02-25 MED ORDER — ARIPIPRAZOLE 10 MG PO TABS: 10.0000 mg | ORAL_TABLET | Freq: Every day | ORAL | Status: DC

## 2021-02-25 MED ORDER — OSMOLITE 1.5 CAL PO LIQD
1000.0000 mL | ORAL | Status: DC
  Filled 2021-02-25: qty 1000

## 2021-02-25 MED ORDER — DEXMEDETOMIDINE HCL IN NACL 400 MCG/100ML IV SOLN
0.0000 ug/kg/h | INTRAVENOUS | Status: DC
  Administered 2021-02-26 – 2021-02-28 (×10): 1.2 ug/kg/h via INTRAVENOUS
  Filled 2021-02-25 (×10): qty 100

## 2021-02-25 MED ORDER — MIDAZOLAM HCL 2 MG/2ML IJ SOLN
INTRAMUSCULAR | Status: AC
  Filled 2021-02-25: qty 2

## 2021-02-25 MED ORDER — MIDAZOLAM-SODIUM CHLORIDE 100-0.9 MG/100ML-% IV SOLN
0.0000 mg/h | INTRAVENOUS | Status: DC
Start: 2021-02-25 — End: 2021-02-26

## 2021-02-25 MED ORDER — POLYETHYLENE GLYCOL 3350 17 G PO PACK
17.0000 g | PACK | Freq: Every day | ORAL | Status: DC
  Administered 2021-02-26 – 2021-03-01 (×4): 17 g
  Filled 2021-02-25 (×5): qty 1

## 2021-02-25 MED ORDER — ARIPIPRAZOLE 10 MG PO TABS
10.0000 mg | ORAL_TABLET | Freq: Every day | ORAL | Status: DC
  Administered 2021-02-26 – 2021-02-28 (×3): 10 mg
  Filled 2021-02-25 (×3): qty 1

## 2021-02-25 MED ORDER — LAMOTRIGINE 25 MG PO TABS
25.0000 mg | ORAL_TABLET | Freq: Two times a day (BID) | ORAL | Status: DC
  Administered 2021-02-25 – 2021-03-01 (×9): 25 mg
  Filled 2021-02-25 (×10): qty 1

## 2021-02-25 MED ORDER — PROSOURCE TF PO LIQD
45.0000 mL | Freq: Three times a day (TID) | ORAL | Status: DC
  Administered 2021-02-25 (×2): 45 mL
  Filled 2021-02-25 (×2): qty 45

## 2021-02-25 MED ORDER — BACITRACIN-NEOMYCIN-POLYMYXIN 400-5-5000 EX OINT
TOPICAL_OINTMENT | CUTANEOUS | Status: DC | PRN
  Administered 2021-02-25 – 2021-02-28 (×2): 1 via TOPICAL
  Filled 2021-02-25 (×2): qty 1

## 2021-02-25 MED ORDER — MIDAZOLAM HCL 2 MG/2ML IJ SOLN: 2.0000 mg | Freq: Once | INTRAMUSCULAR | Status: AC

## 2021-02-25 MED ORDER — DOCUSATE SODIUM 50 MG/5ML PO LIQD: 100.0000 mg | Freq: Every day | ORAL | Status: DC | PRN

## 2021-02-25 MED ORDER — CLONIDINE HCL 0.2 MG PO TABS
0.2000 mg | ORAL_TABLET | Freq: Three times a day (TID) | ORAL | Status: DC
  Administered 2021-02-25: 0.2 mg via ORAL
  Filled 2021-02-25: qty 1

## 2021-02-25 MED ORDER — MIDAZOLAM BOLUS VIA INFUSION
0.0000 mg | INTRAVENOUS | Status: DC | PRN
  Filled 2021-02-25: qty 5

## 2021-02-25 MED ORDER — DOCUSATE SODIUM 50 MG/5ML PO LIQD: 100.0000 mg | Freq: Two times a day (BID) | ORAL | Status: DC

## 2021-02-25 MED ORDER — LEVOFLOXACIN IN D5W 500 MG/100ML IV SOLN
500.0000 mg | Freq: Once | INTRAVENOUS | Status: DC
  Filled 2021-02-25: qty 100

## 2021-02-25 MED ORDER — BUSPIRONE HCL 5 MG PO TABS
10.0000 mg | ORAL_TABLET | Freq: Three times a day (TID) | ORAL | Status: DC
  Administered 2021-02-25 – 2021-03-01 (×14): 10 mg
  Filled 2021-02-25 (×15): qty 2

## 2021-02-25 MED ORDER — BUSPIRONE HCL 5 MG PO TABS: 10.0000 mg | ORAL_TABLET | Freq: Three times a day (TID) | ORAL | Status: DC

## 2021-02-25 MED ORDER — ROCURONIUM BROMIDE 50 MG/5ML IV SOLN
70.0000 mg | Freq: Once | INTRAVENOUS | Status: AC
  Filled 2021-02-25: qty 7

## 2021-02-25 MED ORDER — TRAZODONE HCL 50 MG PO TABS: 100.0000 mg | ORAL_TABLET | Freq: Every evening | ORAL | Status: DC | PRN

## 2021-02-25 MED ORDER — LEVOFLOXACIN 500 MG PO TABS
500.0000 mg | ORAL_TABLET | Freq: Every day | ORAL | Status: DC
  Filled 2021-02-25: qty 1

## 2021-02-25 MED ORDER — FENTANYL BOLUS VIA INFUSION
50.0000 ug | INTRAVENOUS | Status: DC | PRN
  Administered 2021-02-26: 100 ug via INTRAVENOUS
  Administered 2021-02-26: 50 ug via INTRAVENOUS
  Administered 2021-02-27: 100 ug via INTRAVENOUS
  Filled 2021-02-25: qty 100

## 2021-02-25 MED ORDER — THIAMINE HCL 100 MG PO TABS: 100.0000 mg | ORAL_TABLET | Freq: Every day | ORAL | Status: DC

## 2021-02-25 MED ORDER — POLYETHYLENE GLYCOL 3350 17 G PO PACK: 17.0000 g | PACK | Freq: Every day | ORAL | Status: DC

## 2021-02-25 MED ORDER — DOCUSATE SODIUM 50 MG/5ML PO LIQD
100.0000 mg | Freq: Two times a day (BID) | ORAL | Status: DC
  Administered 2021-02-26 – 2021-02-28 (×5): 100 mg
  Filled 2021-02-25 (×6): qty 10

## 2021-02-25 MED ORDER — TRAZODONE HCL 50 MG PO TABS
100.0000 mg | ORAL_TABLET | Freq: Every evening | ORAL | Status: DC | PRN
  Administered 2021-02-28 – 2021-03-01 (×3): 100 mg
  Filled 2021-02-25 (×3): qty 2

## 2021-02-25 MED ORDER — LEVOFLOXACIN 500 MG PO TABS
500.0000 mg | ORAL_TABLET | Freq: Every day | ORAL | Status: DC
  Administered 2021-02-25: 500 mg via ORAL
  Filled 2021-02-25: qty 1

## 2021-02-25 MED ORDER — LAMOTRIGINE 25 MG PO TABS
25.0000 mg | ORAL_TABLET | Freq: Two times a day (BID) | ORAL | Status: DC
  Filled 2021-02-25: qty 1

## 2021-02-25 MED ORDER — OSMOLITE 1.5 CAL PO LIQD
1000.0000 mL | ORAL | Status: DC
  Administered 2021-02-25: 1000 mL

## 2021-02-25 MED ORDER — MAGNESIUM SULFATE 2 GM/50ML IV SOLN
2.0000 g | Freq: Once | INTRAVENOUS | Status: AC
  Administered 2021-02-25: 2 g via INTRAVENOUS
  Filled 2021-02-25: qty 50

## 2021-02-25 MED ORDER — FOLIC ACID 1 MG PO TABS: 1.0000 mg | ORAL_TABLET | Freq: Every day | ORAL | Status: DC

## 2021-02-25 MED ORDER — ETOMIDATE 2 MG/ML IV SOLN: 20.0000 mg | Freq: Once | INTRAVENOUS | Status: AC

## 2021-02-25 MED ORDER — FOLIC ACID 1 MG PO TABS
1.0000 mg | ORAL_TABLET | Freq: Every day | ORAL | Status: DC
  Administered 2021-02-25 – 2021-03-01 (×5): 1 mg
  Filled 2021-02-25 (×6): qty 1

## 2021-02-25 MED ORDER — ETOMIDATE 2 MG/ML IV SOLN
INTRAVENOUS | Status: AC
  Administered 2021-02-25: 20 mg
  Filled 2021-02-25: qty 20

## 2021-02-25 NOTE — Progress Notes (Signed)
Nutrition Follow-up  DOCUMENTATION CODES:   Non-severe (moderate) malnutrition in context of chronic illness  INTERVENTION:   Once Cortrak placement confirmed, initiate tube feeds: - Start Osmolite 1.5 @ 25 ml/hr and advance by 10 ml q 6 hours to goal rate of 55 ml/hr (1320 ml/day) - ProSource TF 45 ml TID  Tube feeding regimen at goal provides 2100 kcal, 116 grams of protein, and 1006 ml of H2O.   - Boost Breeze po TID, each supplement provides 250 kcal and 9 grams of protein  NUTRITION DIAGNOSIS:   Moderate Malnutrition related to chronic illness (schizophrenia, bipolar 1 disorder, depression, polysubstance abuse) as evidenced by mild fat depletion, moderate muscle depletion.  Ongoing, being addressed via oral nutrition supplements, diet advancement, and tube feeds  GOAL:   Patient will meet greater than or equal to 90% of their needs  Met via TF  MONITOR:   PO intake, Supplement acceptance, TF tolerance, Labs, Weight trends, I & O's  REASON FOR ASSESSMENT:   Ventilator, Consult Enteral/tube feeding initiation and management  ASSESSMENT:   39-year-old male who presented to the ED on 8/04 with N/V and suicidal attempt after having consumed hand sanitizer. PMH of anxiety, depression, bipolar 1 disorder, schizophrenia, EtOH abuse, polysubstance abuse, renal cancer s/p L kidney partial nephrectomy in 2014, HTN, breast cancer. Pt admitted with acute renal failure, acute hypoxic respiratory failure.  8/06 - intubated, CRRT initiated 8/08 - CRRT stopped 8/09 - extubated 8/10 - diet advanced to clear liquids  Discussed pt with RN and during ICU rounds. Pt complaining of nausea and pain. Pt currently on HFNC with NRB. Discussed Cortrak with MD and with pt who is agreeable. Plan to place today and initiate tube feeds. Will start at trickle rate and slowly titrate to goal. RN is aware.  Admit weight: 72.6 kg Current weight: 69 kg  Weight down a total of 3.6 kg since admit.  Pt is net negative 4.3 L since admit. Pt currently with generalized edema.  Medications reviewed and include: lamictal, levaquin, protonix   Labs reviewed: sodium 132, magnesium 1.5, TG 267 CBG's: 117-138 x 24 hours  UOP: 7600 ml x 24 hours Stool: 200 ml x 24 hours via rectal tube I/O's: -4.3 L since admit  Diet Order:   Diet Order             Diet clear liquid Room service appropriate? Yes with Assist; Fluid consistency: Thin  Diet effective now                   EDUCATION NEEDS:   Education needs have been addressed  Skin:  Skin Assessment: Reviewed RN Assessment  Last BM:  02/25/21 type 7 via rectal tube  Height:   Ht Readings from Last 1 Encounters:  02/19/21 5' 6" (1.676 m)    Weight:   Wt Readings from Last 1 Encounters:  02/25/21 69 kg    BMI:  Body mass index is 24.55 kg/m.  Estimated Nutritional Needs:   Kcal:  2100-2300  Protein:  110-130 grams  Fluid:  >/= 2.0 L    Kate , MS, RD, LDN Inpatient Clinical Dietitian Please see AMiON for contact information.  

## 2021-02-25 NOTE — Progress Notes (Signed)
Pharmacy Electrolyte Replacement  Recent Labs:  Recent Labs    02/25/21 0301  K 4.2  MG 1.5*  PHOS 2.6  CREATININE 0.69    NO critical values noted.   Plan: Supplement 2g IV Magnesium Sulfate   Adria Dill, PharmD PGY-1 Acute Care Resident  02/25/2021 7:34 AM

## 2021-02-25 NOTE — Progress Notes (Signed)
OT Cancellation Note  Patient Details Name: [name] MRN: [id number] DOB: 24-Feb-1981   Cancelled Treatment:    Reason Eval/Treat Not Completed: Medical issues which prohibited therapy.  RR 38-40, sats 80% and HR 120s. OT to check back as time allows.   [name] OTR/L Supplemental OT, Department of rehab services [phone]  [name] R H. 02/25/2021, 8:48 AM

## 2021-02-25 NOTE — Significant Event (Signed)
Most recent CXR and KUB reviewed with Dr. Tamala Julian. Concern for colonic ileus. Cortrak now in stomach. TF held. Order placed for OGT to LIS and soap suds enema.  Redmond School., MSN, APRN, AGACNP-BC [facility]  02/25/2021 , 6:14 PM  Please see [email or web address] for pager details  If no response, please call [phone] After hours, please call Elink at [phone]

## 2021-02-25 NOTE — Progress Notes (Signed)
OT Cancellation Note  Patient Details Name: Donnelly Legall MRN: [id number] DOB: 1980/11/04   Cancelled Treatment:    Reason Eval/Treat Not Completed: Patient at procedure or test/ unavailable. RN reports patient currently getting cortrak placed followed by ECHO. OT to check back as time allows.   Gloris Manchester OTR/L Supplemental OT, Department of rehab services [phone]  Yaa Donnellan R H. 02/25/2021, 1:23 PM

## 2021-02-25 NOTE — Procedures (Signed)
Cortrak  Person Inserting Tube:  Maylon Peppers C, RD Tube Type:  Cortrak - 43 inches Tube Size:  10 Tube Location:  Left nare Initial Placement:  Stomach Secured by: Bridle Technique Used to Measure Tube Placement:  Marking at nare/corner of mouth Cortrak Secured At:  67 cm  Cortrak Tube Team Note:  Consult received to place a Cortrak feeding tube.   X-ray is required, abdominal x-ray has been ordered by the Cortrak team. Please confirm tube placement before using the Cortrak tube.   If the tube becomes dislodged please keep the tube and contact the Cortrak team at [email or web address] (password [email or web address]) for replacement.  If after hours and replacement cannot be delayed, place a NG tube and confirm placement with an abdominal x-ray.    Lockie Pares., RD, LDN, CNSC See AMiON for contact information

## 2021-02-25 NOTE — Procedures (Deleted)
Intubation Procedure Note  Jamesryan Krammer  [id number]  05/23/1981  Date:02/25/21  Time:3:56 PM   Provider Performing:Aking Klabunde Chauncey Cruel Iona Beard    Procedure: Intubation (31500)  Indication(s) Respiratory Failure  Consent Risks of the procedure as well as the alternatives and risks of each were explained to the patient and/or caregiver.  Consent for the procedure was obtained and is signed in the bedside chart   Anesthesia Etomidate, Versed, Fentanyl, and Rocuronium   Time Out Verified patient identification, verified procedure, site/side was marked, verified correct patient position, special equipment/implants available, medications/allergies/relevant history reviewed, required imaging and test results available.   Sterile Technique Usual hand hygeine, masks, and gloves were used   Procedure Description Patient positioned in bed supine.  Sedation given as noted above.  Patient was intubated with endotracheal tube using Glidescope.  View was Grade 1 full glottis .  Number of attempts was 1.  Colorimetric CO2 detector was consistent with tracheal placement.   Complications/Tolerance None; patient tolerated the procedure well. Chest X-ray is ordered to verify placement.   EBL Minimal   Specimen(s) None  Redmond School., MSN, APRN, AGACNP-BC [facility]  02/25/2021 , 3:57 PM  Please see [email or web address] for pager details  If no response, please call [phone] After hours, please call Elink at [phone]

## 2021-02-25 NOTE — Evaluation (Signed)
Physical Therapy Evaluation Patient Details Name: Khy Ryba MRN: [id number] DOB: 05-05-1981 Today's Date: 02/25/2021   History of Present Illness  40 year old person presented to ED 02/20/21 seeking help after drinking 2 bottles of hand sanitizer. Intubated 02/21/21-02/24/21. Post-extubation placed on HHFNC+NRB.  PMH-bipolar depression, breast cancer status post bilateral mastectomy, anxiety, depression, schizophrenia, and recent overdose suicide attempt  Clinical Impression   Pt admitted secondary to problem above with deficits below. PTA patient was independent with mobility.  Pt currently requires high levels of oxygen with limited tolerance for activity. He requires min to minguard assist for OOB transfer and expect he will make good functional gains as his lungs recover.  Anticipate patient will benefit from PT to address problems listed below.Will continue to follow acutely to maximize functional mobility independence and safety.       Follow Up Recommendations No PT follow up    Equipment Recommendations  None recommended by PT    Recommendations for Other Services       Precautions / Restrictions Precautions Precautions: Other (comment) Precaution Comments: watch sats Restrictions Weight Bearing Restrictions: No      Mobility  Bed Mobility Overal bed mobility: Needs Assistance Bed Mobility: Supine to Sit     Supine to sit: Min assist     General bed mobility comments: to raise torso (on ICU mattress)    Transfers Overall transfer level: Needs assistance Equipment used: None Transfers: Sit to/from [employer] Transfers Sit to Stand: Min guard Stand pivot transfers: Min guard       General transfer comment: pt stood for ~1 minute for lines to be repositioned, no imbalance or incr distress  Ambulation/Gait             General Gait Details: deferred due to high O2 needs (HHFNC+NRB)  Stairs            Wheelchair Mobility    Modified Rankin  (Stroke Patients Only)       Balance Overall balance assessment: Independent                                           Pertinent Vitals/Pain Pain Assessment: Faces Faces Pain Scale: Hurts even more Pain Location: chest Pain Descriptors / Indicators: Aching;Burning Pain Intervention(s): Limited activity within patient's tolerance;Monitored during session;Repositioned    Home Living Family/patient expects to be discharged to:: Unsure                 Additional Comments: ?to hehavioral health/acute psych?    Prior Function Level of Independence: Independent               Hand Dominance        Extremity/Trunk Assessment   Upper Extremity Assessment Upper Extremity Assessment: Defer to OT evaluation    Lower Extremity Assessment Lower Extremity Assessment: Overall WFL for tasks assessed    Cervical / Trunk Assessment Cervical / Trunk Assessment: Normal  Communication   Communication: No difficulties  Cognition Arousal/Alertness: Awake/alert Behavior During Therapy: Anxious Overall Cognitive Status: Within Functional Limits for tasks assessed                                        General Comments General comments (skin integrity, edema, etc.): Pt anxious related to water/condensation accumulating inside his HHFNC tubing (likely  due to NRB over [facility])    Exercises     Assessment/Plan    PT Assessment Patient needs continued PT services  PT Problem List Decreased mobility;Cardiopulmonary status limiting activity       PT Treatment Interventions Gait training;Functional mobility training;Therapeutic activities;Therapeutic exercise;Patient/family education    PT Goals (Current goals can be found in the Care Plan section)  Acute Rehab PT Goals Patient Stated Goal: get better PT Goal Formulation: With patient Time For Goal Achievement: 03/11/21 Potential to Achieve Goals: Good    Frequency Min 3X/week    Barriers to discharge Other (comment) possible psych admission    Co-evaluation               AM-PAC PT "6 Clicks" Mobility  Outcome Measure Help needed turning from your back to your side while in a flat bed without using bedrails?: None Help needed moving from lying on your back to sitting on the side of a flat bed without using bedrails?: A Little Help needed moving to and from a bed to a chair (including a wheelchair)?: A Little Help needed standing up from a chair using your arms (e.g., wheelchair or bedside chair)?: A Little Help needed to walk in hospital room?: Total Help needed climbing 3-5 steps with a railing? : Total 6 Click Score: 15    End of Session Equipment Utilized During Treatment: Oxygen Activity Tolerance: Patient tolerated treatment well Patient left: in chair;with call bell/phone within reach;with nursing/sitter in room Nurse Communication: Mobility status;Other (comment) (sats good once up in chair; ?trial only HHFNC to minimize condensation in pt's nose?) PT Visit Diagnosis: Difficulty in walking, not elsewhere classified (R26.2)    Time: [id number] PT Time Calculation (min) (ACUTE ONLY): 16 min   Charges:   PT Evaluation $PT Eval Low Complexity: 1 Low           Arby Barrette, PT Pager [phone]   Rexanne Mano 02/25/2021, 10:53 AM

## 2021-02-25 NOTE — Progress Notes (Signed)
Echocardiogram 2D Echocardiogram has been performed.  Oneal Deputy Saad Buhl RDCS 02/25/2021, 1:51 PM

## 2021-02-25 NOTE — Progress Notes (Signed)
PT Cancellation Note  Patient Details Name: [name] MRN: [id number] DOB: 27-Nov-1980   Cancelled Treatment:    Reason Eval/Treat Not Completed: Patient not medically ready  Currently RR 38-40, sats 80%, HR 120s. Will continue to monitor for appropriateness for PT evaluation.    Arby Barrette, PT Pager [phone]   Rexanne Mano 02/25/2021, 8:35 AM

## 2021-02-25 NOTE — Progress Notes (Signed)
NAME:  Nora Crick, MRN:  [id number], DOB:  [birth year], LOS: 5 ADMISSION DATE:  02/19/2021, CONSULTATION DATE:  8/5 REFERRING MD:  Evette Doffing, CHIEF COMPLAINT:  Dyspnea, confusion   History of Present Illness:  40 y/o male with an extensive past medical history presented to the ER with suicidal ideation ideation who drank hand sanitizer.    Pertinent  Medical History  Angina Bipolar disorder Breast Cancer Depression History of suicide attempt Hyperlipidemia Hypertension Cirrhosis Pancreatitis Schizophrenia Alcohol abuse  Significant Hospital Events: Including procedures, antibiotic start and stop dates in addition to other pertinent events   8/5 admission 8/6 ETT >  8/6 L radial arterial line >  8/6 intubated overnight for worsening hypoxemic and work of breathing, start CRRT 8/7 Making urine; had significant hypoxemia on ABG overnight, PEEP, FiO2 increased 8/8 cont to have good UOP so CRRT is even. Starting home mood stabilizers. Incr d10 gtt. A line removed 8/9 Extubated to 15L HFNC. Restarted buspirone and hydroxyzine.  Interim History / Subjective:  Extubated yesterday to 15L HFNC. Hypoxic overnight to 74%, added NRB prn and sats have hovered between uppers 80s-low 90s.  Lasix 40 IV x1 overnight Tachypneic and tachycardic post extubation.  UOP 8L over the last [id number] Prn mylicon for gas and hydroxyzine for itching.   Objective   Blood pressure (!) 155/97, pulse (!) 124, temperature 99.7 F (37.6 C), temperature source Axillary, resp. rate (!) 32, height '5\' 6"'$  (1.676 m), weight 69 kg, SpO2 90 %.    Vent Mode: PRVC FiO2 (%):  [40 %-100 %] 100 % Set Rate:  [24 bmp] 24 bmp Vt Set:  [540 mL] 540 mL PEEP:  [5 cmH20] 5 cmH20 Plateau Pressure:  [23 cmH20] 23 cmH20   Intake/Output Summary (Last 24 hours) at 02/25/2021 0857 Last data filed at 02/25/2021 0800 Gross per 24 hour  Intake 2657.25 ml  Output 7055 ml  Net -4397.75 ml    Filed Weights   02/23/21 0448 02/24/21 0454  02/25/21 0630  Weight: 77.9 kg 78.5 kg 69 kg    Examination:  General: critically ill appearing middle aged male lying in bed in mild respiratory distress on HFNC. HENT: NCAT, some clear nasal discharge. PULM: increased work of breathing, CTAB, no crackles or wheezes. SpO2 88% on 15L HFNC.  CV: RRR, no murmurs or gallops.  GI: soft, non-distended, non-tender.  MSK: decreased bulk, no lower extremity edema.  Neuro: A&Ox4, answers questions appropriately. Psych: tearful, speaking in full sentences summarizing admission to hospital.   [facility] Problem list   Shock  AGMA Ketoacidosis  Acute Toxic Metabolic encephalopathy   Assessment & Plan:  Suicide Attempt: isopropyl alcohol poisoning (hand sanitizer)  Bipolar I disorder MDD with recurring SI/SA  EtOH abuse  Polysubstance use disorder (cocaine, marijuana)  -Psych following -Continue lamictal, Abilify, up-titrate per psych -Continue home buspar, zoloft -PRN hydroxizine   Acute hypoxic respiratory failureHCAP vs aspiration pneumoniaPulmonary edema: S/p extubation on 8/9 still hypoxic on HFNC. -F/u CXR and Echo -levaquin for total 5d course -VAP, pulm hygiene  -Approaching appropriateness for WUA/SBT  -Weaning sedation as able  Suspected sepsis due to PNAFever -levaquin for 5d course as above -trend fever curve WBC   Acute Kidney InjuryHx renal cell carcinoma, L partial nephrectomy (2014): UOP 8L likely Post ATN diuresis  -Continue to trend UOP off CRRT 8/7   -foley, strict I/O -trend renal indices   Elevated LFTs, mild -trend LFTs PRN   Electrolyte imbalances HyponatremiaHypokalemiaHypomagnesemia Hypophosphatemia  -trend bmp mag phos -replete  prn  Inadequate PO intakeHypermetabolic response  -EN per RDN  -Clear for thin liquids per speech eval.   Hypoglycemia  -cont EN -continue D10 gtt    Prognosis: guarded; high risk of inpatient death from multi-organ failure  Best Practice (right  click and "Reselect all SmartList Selections" daily)   Diet/type: full liquids  Thin liquid diet  DVT prophylaxis: prophylactic heparin  GI prophylaxis: PPI Lines: Dialysis Catheter  Foley:  Yes, and it is still needed Code Status:  full code Last date of multidisciplinary goals of care discussion [8/6 [place] and mother: full code; I updated her regarding the severity of his illness and high likelihood of death during this hospitalization; attempted to call her on 8/7, no answer]  Labs   CBC: Recent Labs  Lab 02/20/21 0130 02/20/21 0136 02/20/21 1054 02/20/21 1214 02/21/21 0452 02/21/21 0531 02/21/21 0830 02/22/21 0349 02/22/21 0425 02/22/21 0914 02/23/21 0341  WBC 15.9*  --  9.3  --  15.8*  --   --   --  7.4  --  6.2  NEUTROABS 12.9*  --   --   --   --   --   --   --  6.1  --  5.1  HGB 12.7*   < > 13.5   < > 12.9*   < > 12.9* 11.2* 11.9* 10.5* 10.7*  HCT 37.1*   < > 38.2*   < > 35.9*   < > 38.0* 33.0* 32.8* 31.0* 30.2*  MCV 87.5  --  84.9  --  82.9  --   --   --  82.6  --  84.4  PLT 433*  --  465*  --  443*  --   --   --  235  --  179   < > = values in this interval not displayed.     Basic Metabolic Panel: Recent Labs  Lab 02/23/21 0341 02/23/21 1600 02/24/21 0543 02/24/21 0802 02/24/21 1400 02/25/21 0301  NA 132*  133*  --  138 136 136 132*  K 3.6  3.7  --  <2.0* 3.4* 4.1 4.2  CL 101  102  --  117* 102 103 93*  CO2 22  23  --  18* '28 27 29  '$ GLUCOSE 90  92  --  76 105* 133* 107*  BUN 17  16  --  '7 8 8 '$ 5*  CREATININE 0.76  0.77  --  0.55* 0.81 0.74 0.69  CALCIUM 7.5*  7.5*  --  5.2* 7.9* 7.8* 8.7*  MG 2.3 2.0 1.3* 2.2 2.2 1.5*  PHOS 1.1* 3.6 <1.0* 3.1 3.5 2.6    GFR: Estimated Creatinine Clearance: 109.7 mL/min (by C-G formula based on SCr of 0.69 mg/dL). Recent Labs  Lab 02/20/21 1054 02/20/21 1457 02/21/21 0452 02/21/21 0453 02/21/21 1431 02/22/21 0425 02/23/21 0341  PROCALCITON  --  2.08  --   --   --   --   --   WBC 9.3  --  15.8*  --    --  7.4 6.2  LATICACIDVEN  --  3.4*  --  3.7* 2.3*  --   --      Liver Function Tests: Recent Labs  Lab 02/20/21 0130 02/20/21 1054 02/21/21 0452 02/21/21 1431 02/22/21 0425 02/22/21 1152 02/23/21 0341 02/24/21 0543 02/25/21 0301  AST 66* 50* 56*  --  50*  --  53*  --   --   ALT 84* 77* 68*  --  56*  --  57*  --   --   ALKPHOS 40 38 42  --  45  --  45  --   --   BILITOT 0.6 0.5 0.7  --  1.0  --  0.8  --   --   PROT 6.2* 6.1* 5.8*  --  5.4*  --  5.2*  --   --   ALBUMIN 3.4* 3.3* 2.9*   < > 2.3*  2.3* 2.0* 2.2*  2.2* 1.3* 2.7*   < > = values in this interval not displayed.    Recent Labs  Lab 02/19/21 0939 02/20/21 1457  LIPASE 32 33  AMYLASE  --  40    No results for input(s): AMMONIA in the last 168 hours.  ABG    Component Value Date/Time   PHART 7.395 02/22/2021 0914   PCO2ART 38.0 02/22/2021 0914   PO2ART 98 02/22/2021 0914   HCO3 23.4 02/22/2021 0914   TCO2 25 02/22/2021 0914   ACIDBASEDEF 1.0 02/22/2021 0914   O2SAT 98.0 02/22/2021 0914      Coagulation Profile: Recent Labs  Lab 02/20/21 1457 02/21/21 0452  INR 1.2 1.2     Cardiac Enzymes: No results for input(s): CKTOTAL, CKMB, CKMBINDEX, TROPONINI in the last 168 hours.  HbA1C: Hgb A1c MFr Bld  Date/Time Value Ref Range Status  01/08/2021 06:32 PM 4.6 (L) 4.8 - 5.6 % Final    Comment:    (NOTE) Pre diabetes:          5.7%-6.4%  Diabetes:              >6.4%  Glycemic control for   <7.0% adults with diabetes   07/30/2018 06:28 AM 4.9 4.8 - 5.6 % Final    Comment:    (NOTE) Pre diabetes:          5.7%-6.4% Diabetes:              >6.4% Glycemic control for   <7.0% adults with diabetes     CBG: Recent Labs  Lab 02/24/21 1606 02/24/21 1951 02/25/21 0050 02/25/21 0457 02/25/21 0801  GLUCAP 132* 125* 138* 117* 117*        Critical care time: 35 minutes

## 2021-02-25 NOTE — Procedures (Signed)
Intubation Procedure Note  [name]  SN:[id number]  Jun 13, 1981  Date:02/25/21  Time:3:56 PM   Provider Performing:Zaylyn Bergdoll Chauncey Cruel Iona Beard    Procedure: Intubation (31500)  Indication(s) Respiratory Failure  Consent Risks of the procedure as well as the alternatives and risks of each were explained to the patient and/or caregiver.  Consent for the procedure was obtained and is signed in the bedside chart   Anesthesia Etomidate, Versed, Fentanyl, and Rocuronium   Time Out Verified patient identification, verified procedure, site/side was marked, verified correct patient position, special equipment/implants available, medications/allergies/relevant history reviewed, required imaging and test results available.   Sterile Technique Usual hand hygeine, masks, and gloves were used   Procedure Description Patient positioned in bed supine.  Sedation given as noted above.  Patient was intubated with endotracheal tube using Glidescope.  View was Grade 1 full glottis .  Number of attempts was 1.  Colorimetric CO2 detector was consistent with tracheal placement.   Complications/Tolerance None; patient tolerated the procedure well. Chest X-ray is ordered to verify placement.   EBL Minimal   Specimen(s) None  [employer]., MSN, APRN, AGACNP-BC [facility]  02/25/2021 , 3:56 PM  Please see [email or web address] for pager details  If no response, please call [phone] After hours, please call Elink at [phone]
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
## 2021-02-25 NOTE — Progress Notes (Signed)
NAME:  [name], MRN:  [id number], DOB:  12/08/1980, LOS: 5 ADMISSION DATE:  02/19/2021, CONSULTATION DATE:  8/5 REFERRING MD:  Evette Doffing, CHIEF COMPLAINT:  Dyspnea, confusion   History of Present Illness:  40 y/o male with an extensive past medical history presented to the ER with suicidal ideation ideation who drank hand sanitizer.    Pertinent  Medical History  Angina Bipolar disorder Breast Cancer Depression History of suicide attempt Hyperlipidemia Hypertension Cirrhosis Pancreatitis Schizophrenia Alcohol abuse  Significant Hospital Events: Including procedures, antibiotic start and stop dates in addition to other pertinent events   8/5 admission 8/6 ETT >  8/6 L radial arterial line >  8/6 intubated overnight for worsening hypoxemic and work of breathing, start CRRT 8/7 Making urine; had significant hypoxemia on ABG overnight, PEEP, FiO2 increased 8/8 cont to have good UOP so CRRT is even. Starting home mood stabilizers. Incr d10 gtt  8/9 Restart buspirone and hydroxyzine. A line removed Interim History / Subjective:  8/10: extubated and doing ok on hhfnc at this time. Cxr pending. Pt endorses feeling well despite oxygen requirement.   Objective   Blood pressure (!) 144/91, pulse (!) 124, temperature 98.7 F (37.1 C), temperature source Oral, resp. rate (!) 35, height '5\' 6"'$  (1.676 m), weight 69 kg, SpO2 (!) 87 %.    FiO2 (%):  [100 %] 100 %   Intake/Output Summary (Last 24 hours) at 02/25/2021 1300 Last data filed at 02/25/2021 1200 Gross per 24 hour  Intake 1484.64 ml  Output 6075 ml  Net -4590.36 ml   Filed Weights   02/23/21 0448 02/24/21 0454 02/25/21 0630  Weight: 77.9 kg 78.5 kg 69 kg    Examination:  General: awake alert on hhfnc HENT: NCAT, eomi, mmmp PULM: no rhonchi CV: R rhythm, tachy, no murmurs or gallops. Cap refill < 2 secs.  GI: soft, nondistended.  MSK: decreased bulk, no lower extremity edema.  Neuro: awake and following commands,  communicates  Resolved Hospital Problem list   Shock  AGMA Ketoacidosis   Assessment & Plan:   Acute toxic metabolic encephalopathy: isopropyl alcohol ingestion, renal failure, sepsis, CNS depressing meds for mechanical ventilation  -resolved  Suicide Attempt: isopropyl alcohol poisoning (hand sanitizer)  Bipolar I disorder MDD with recurring SI/SA  EtOH abuse  Polysubstance use disorder (cocaine, marijuana)  -restart home lamictal, abilify -resumed remainder of home meds sans zoloft -appreaciate psych  Acute hypoxic respiratory failureHCAP vs aspiration pneumoniaPulmonary edema  -levaquin for total 5d course  -extubated yesterday and req hhfnc at this time .  -adttempting diuresis, check cxr and echo   Suspected sepsis due to PNAFever -complete levaquin course -trend fever curve WBC , fever curve and wbc imprioving  Acute Kidney InjuryHx renal cell carcinoma, L partial nephrectomy (2014)  - improved  Elevated LFTs, mild -trend LFTs PRN   Electrolyte imbalances HyponatremiaHypokalemiaHypomagnesemia Hypophosphatemia  -trend bmp mag phos -replete prn  Hypoglycemia : improving -cont EN -continue D10 gtt but reduce dose. Hopefully can stop once po intake is advanced and cortrak placed  Inadequate PO intakeHypermetabolic response  -EN per RDN  once cortrak placed -clears and appreaciate speech   Prognosis: guarded;   Best Practice (right click and "Reselect all SmartList Selections" daily)   Diet/type: clear liquids DVT prophylaxis: prophylactic heparin  GI prophylaxis: PPI Lines: N/A  Foley:  N/A Code Status:  full code Last date of multidisciplinary goals of care discussion 8/9 updated mother and dfather at bedside.   Labs   CBC: Recent  Labs  Lab 02/20/21 0130 02/20/21 0136 02/20/21 1054 02/20/21 1214 02/21/21 0452 02/21/21 0531 02/21/21 0830 02/22/21 0349 02/22/21 0425 02/22/21 0914 02/23/21 0341  WBC 15.9*  --  9.3  --  15.8*  --   --    --  7.4  --  6.2  NEUTROABS 12.9*  --   --   --   --   --   --   --  6.1  --  5.1  HGB 12.7*   < > 13.5   < > 12.9*   < > 12.9* 11.2* 11.9* 10.5* 10.7*  HCT 37.1*   < > 38.2*   < > 35.9*   < > 38.0* 33.0* 32.8* 31.0* 30.2*  MCV 87.5  --  84.9  --  82.9  --   --   --  82.6  --  84.4  PLT 433*  --  465*  --  443*  --   --   --  235  --  179   < > = values in this interval not displayed.    Basic Metabolic Panel: Recent Labs  Lab 02/23/21 0341 02/23/21 1600 02/24/21 0543 02/24/21 0802 02/24/21 1400 02/25/21 0301  NA 132*  133*  --  138 136 136 132*  K 3.6  3.7  --  <2.0* 3.4* 4.1 4.2  CL 101  102  --  117* 102 103 93*  CO2 22  23  --  18* '28 27 29  '$ GLUCOSE 90  92  --  76 105* 133* 107*  BUN 17  16  --  '7 8 8 '$ 5*  CREATININE 0.76  0.77  --  0.55* 0.81 0.74 0.69  CALCIUM 7.5*  7.5*  --  5.2* 7.9* 7.8* 8.7*  MG 2.3 2.0 1.3* 2.2 2.2 1.5*  PHOS 1.1* 3.6 <1.0* 3.1 3.5 2.6   GFR: Estimated Creatinine Clearance: 109.7 mL/min (by C-G formula based on SCr of 0.69 mg/dL). Recent Labs  Lab 02/20/21 1054 02/20/21 1457 02/21/21 0452 02/21/21 0453 02/21/21 1431 02/22/21 0425 02/23/21 0341  PROCALCITON  --  2.08  --   --   --   --   --   WBC 9.3  --  15.8*  --   --  7.4 6.2  LATICACIDVEN  --  3.4*  --  3.7* 2.3*  --   --     Liver Function Tests: Recent Labs  Lab 02/20/21 0130 02/20/21 1054 02/21/21 0452 02/21/21 1431 02/22/21 0425 02/22/21 1152 02/23/21 0341 02/24/21 0543 02/25/21 0301  AST 66* 50* 56*  --  50*  --  53*  --   --   ALT 84* 77* 68*  --  56*  --  57*  --   --   ALKPHOS 40 38 42  --  45  --  45  --   --   BILITOT 0.6 0.5 0.7  --  1.0  --  0.8  --   --   PROT 6.2* 6.1* 5.8*  --  5.4*  --  5.2*  --   --   ALBUMIN 3.4* 3.3* 2.9*   < > 2.3*  2.3* 2.0* 2.2*  2.2* 1.3* 2.7*   < > = values in this interval not displayed.   Recent Labs  Lab 02/19/21 0939 02/20/21 1457  LIPASE 32 33  AMYLASE  --  40   No results for input(s): AMMONIA in the last  168 hours.  ABG    Component Value Date/Time  PHART 7.395 02/22/2021 0914   PCO2ART 38.0 02/22/2021 0914   PO2ART 98 02/22/2021 0914   HCO3 23.4 02/22/2021 0914   TCO2 25 02/22/2021 0914   ACIDBASEDEF 1.0 02/22/2021 0914   O2SAT 98.0 02/22/2021 0914     Coagulation Profile: Recent Labs  Lab 02/20/21 1457 02/21/21 0452  INR 1.2 1.2    Cardiac Enzymes: No results for input(s): CKTOTAL, CKMB, CKMBINDEX, TROPONINI in the last 168 hours.  HbA1C: Hgb A1c MFr Bld  Date/Time Value Ref Range Status  01/08/2021 06:32 PM 4.6 (L) 4.8 - 5.6 % Final    Comment:    (NOTE) Pre diabetes:          5.7%-6.4%  Diabetes:              >6.4%  Glycemic control for   <7.0% adults with diabetes   07/30/2018 06:28 AM 4.9 4.8 - 5.6 % Final    Comment:    (NOTE) Pre diabetes:          5.7%-6.4% Diabetes:              >6.4% Glycemic control for   <7.0% adults with diabetes     CBG: Recent Labs  Lab 02/24/21 1951 02/25/21 0050 02/25/21 0457 02/25/21 0801 02/25/21 1154  GLUCAP 125* 138* 117* 117* 121*       Critical care time: 32 minutes  Audria Nine DO [facility] 02/25/2021, 1:00 PM See Amion for pager If no response to pager, please call [phone] until 1900 After 1900 please call [facility] [phone]
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
## 2021-02-25 NOTE — Significant Event (Signed)
Called to bedside by staff for concerns of respiratory distress around and oxygen saturations in 80's per nursing 1545. Patient on 40L with 100% FiO2 and NRB with tachypnea. Patient states feeling of SOB and fatigue. Discussed need for mechanical ventilation due to maximal noninvasive respiratory support. Patient agreeable to plan. Decision made to proceed with intubation. See procedure note for details.  Redmond School., MSN, APRN, AGACNP-BC [facility]  02/25/2021 , 4:02 PM  Please see [email or web address] for pager details  If no response, please call [phone] After hours, please call Elink at [phone]

## 2021-02-26 ENCOUNTER — Inpatient Hospital Stay (HOSPITAL_COMMUNITY): Payer: Self-pay

## 2021-02-26 LAB — TRIGLYCERIDES: Triglycerides: 329 mg/dL — ABNORMAL HIGH (ref <150)

## 2021-02-26 LAB — RENAL FUNCTION PANEL
Albumin: 2.1 g/dL — ABNORMAL LOW (ref 3.5–5.0)
Anion gap: 11 (ref 5–15)
BUN: 18 mg/dL (ref 6–20)
CO2: 28 mmol/L (ref 22–32)
Calcium: 8.3 mg/dL — ABNORMAL LOW (ref 8.9–10.3)
Chloride: 91 mmol/L — ABNORMAL LOW (ref 98–111)
Creatinine, Ser: 1.14 mg/dL (ref 0.61–1.24)
GFR, Estimated: >60 mL/min (ref >60)
Glucose, Bld: 96 mg/dL (ref 70–99)
Phosphorus: 2.6 mg/dL (ref 2.5–4.6)
Potassium: 4.1 mmol/L (ref 3.5–5.1)
Sodium: 130 mmol/L — ABNORMAL LOW (ref 135–145)

## 2021-02-26 LAB — GLUCOSE, CAPILLARY
Glucose-Capillary: 100 mg/dL — ABNORMAL HIGH (ref 70–99)
Glucose-Capillary: 101 mg/dL — ABNORMAL HIGH (ref 70–99)
Glucose-Capillary: 133 mg/dL — ABNORMAL HIGH (ref 70–99)
Glucose-Capillary: 70 mg/dL (ref 70–99)
Glucose-Capillary: 93 mg/dL (ref 70–99)
Glucose-Capillary: 94 mg/dL (ref 70–99)
Glucose-Capillary: 95 mg/dL (ref 70–99)

## 2021-02-26 LAB — MAGNESIUM
Magnesium: 2.1 mg/dL (ref 1.7–2.4)
Magnesium: 2.1 mg/dL (ref 1.7–2.4)

## 2021-02-26 LAB — PHOSPHORUS
Phosphorus: 2.5 mg/dL (ref 2.5–4.6)
Phosphorus: 3.1 mg/dL (ref 2.5–4.6)

## 2021-02-26 MED ORDER — CHLORHEXIDINE GLUCONATE CLOTH 2 % EX PADS
6.0000 | MEDICATED_PAD | Freq: Every day | CUTANEOUS | Status: DC
  Administered 2021-02-26 – 2021-03-03 (×3): 6 via TOPICAL

## 2021-02-26 MED ORDER — METOCLOPRAMIDE HCL 5 MG/ML IJ SOLN
5.0000 mg | Freq: Three times a day (TID) | INTRAMUSCULAR | Status: DC
  Administered 2021-02-26 – 2021-03-01 (×9): 5 mg via INTRAVENOUS
  Filled 2021-02-26 (×9): qty 2

## 2021-02-26 MED ORDER — CLONAZEPAM 0.5 MG PO TABS
0.5000 mg | ORAL_TABLET | Freq: Three times a day (TID) | ORAL | Status: DC
  Administered 2021-02-26 – 2021-03-01 (×9): 0.5 mg
  Filled 2021-02-26 (×9): qty 1

## 2021-02-26 MED ORDER — DIPHENHYDRAMINE HCL 12.5 MG/5ML PO ELIX
12.5000 mg | ORAL_SOLUTION | Freq: Three times a day (TID) | ORAL | Status: DC | PRN
  Administered 2021-02-28: 12.5 mg
  Filled 2021-02-26: qty 5

## 2021-02-26 MED ORDER — SODIUM CHLORIDE 1 G PO TABS
1.0000 g | ORAL_TABLET | Freq: Three times a day (TID) | ORAL | Status: DC
  Administered 2021-02-26 – 2021-03-01 (×12): 1 g
  Filled 2021-02-26 (×13): qty 1

## 2021-02-26 MED ORDER — IOHEXOL 350 MG/ML SOLN
50.0000 mL | Freq: Once | INTRAVENOUS | Status: AC | PRN
  Administered 2021-02-26: 50 mL via INTRAVENOUS

## 2021-02-26 MED ORDER — OXYCODONE HCL 5 MG PO TABS
5.0000 mg | ORAL_TABLET | Freq: Four times a day (QID) | ORAL | Status: DC
  Administered 2021-02-26 – 2021-02-27 (×7): 5 mg
  Filled 2021-02-26 (×8): qty 1

## 2021-02-26 MED ORDER — SODIUM PHOSPHATES 45 MMOLE/15ML IV SOLN
10.0000 mmol | Freq: Once | INTRAVENOUS | Status: AC
  Administered 2021-02-26: 10 mmol via INTRAVENOUS
  Filled 2021-02-26: qty 3.33

## 2021-02-26 NOTE — Progress Notes (Signed)
Nutrition Follow-up  DOCUMENTATION CODES:   Non-severe (moderate) malnutrition in context of chronic illness  INTERVENTION:   - Plan for Cortrak team to reposition Cortrak tube tomorrow (8/12); recommend advancement to post-pyloric position  Once Cortrak repositioned tomorrow (8/12), recommend initiation of trickle tube feeds: - Vital 1.5 @ 20 ml/hr (480 ml/day)  Trickle tube feeding regimen provides 720 kcal, 32 grams of protein, and 367 ml of H2O.   RD will monitor for ability to advance tube feeds to goal regimen: - Vital 1.5 @ 50 ml/hr (1200 ml/day) - ProSource TF 45 ml TID  Goal tube feeding regimen would provide 1920 kcal, 114 grams of protein, and 917 ml of H2O.   NUTRITION DIAGNOSIS:   Moderate Malnutrition related to chronic illness (schizophrenia, bipolar 1 disorder, depression, polysubstance abuse) as evidenced by mild fat depletion, moderate muscle depletion.  Ongoing  GOAL:   Patient will meet greater than or equal to 90% of their needs  Unmet at this time  MONITOR:   Vent status, Labs, Weight trends, I & O's  REASON FOR ASSESSMENT:   Ventilator, Consult Enteral/tube feeding initiation and management  ASSESSMENT:   40 year old male who presented to the ED on 8/04 with N/V and suicidal attempt after having consumed hand sanitizer. PMH of anxiety, depression, bipolar 1 disorder, schizophrenia, EtOH abuse, polysubstance abuse, renal cancer s/p L kidney partial nephrectomy in 2014, HTN, breast cancer. Pt admitted with acute renal failure, acute hypoxic respiratory failure.  8/06 - intubated, CRRT initiated 8/08 - CRRT stopped 8/09 - extubated 8/10 - diet advanced to clear liquids, Cortrak placed (tip gastric), intubated, TF held due to concern for colonic ileus, OGT to suction  Discussed pt with RN and during ICU rounds. Pt now with ileus. OGT to suction. Cortrak is kinked per most recent abdominal x-ray. Discussed plan with MD. Janyce Llanos team to evaluate  Cortrak tomorrow and reposition. Cortrak team will attempt advancing to post-pyloric position. MD to consider initiation of trickle tube feeds once Cortrak is repositioned. RD to leave trickle tube feeding recommendations. If pt able to tolerate trickle tube feeds, recommend advancing tube feeds slowly to goal.  Admit weight: 72.6 kg Current weight: 68.9 kg  Patient is currently intubated on ventilator support MV: 11.1 L/min Temp (24hrs), Avg:98.9 F (37.2 C), Min:97.9 F (36.6 C), Max:99.7 F (37.6 C)  Drips: Fentanyl Precedex D10: 30 ml/hr  Medications reviewed and include: colace BID, folic acid, lamictal, MVI with minerals, protonix, miralax, thiamine, IV abx  Labs reviewed: sodium 130, TG 329 CBG's: 70-157  UOP: 1735 ml x 24 hours I/O's: -4.2 L since admit  Diet Order:   Diet Order             Diet NPO time specified  Diet effective now                   EDUCATION NEEDS:   Education needs have been addressed  Skin:  Skin Assessment: Reviewed RN Assessment  Last BM:  02/25/21 rectal tube  Height:   Ht Readings from Last 1 Encounters:  02/19/21 '5\' 6"'$  (1.676 m)    Weight:   Wt Readings from Last 1 Encounters:  02/26/21 68.9 kg    BMI:  Body mass index is 24.52 kg/m.  Estimated Nutritional Needs:   Kcal:  1850-2050  Protein:  110-130 grams  Fluid:  >/= 2.0 L    Gustavus Bryant, MS, RD, LDN Inpatient Clinical Dietitian Please see AMiON for contact information.

## 2021-02-26 NOTE — Evaluation (Signed)
Occupational Therapy Evaluation Patient Details Name: [name] MRN: [id number] DOB: [birth year] Today's Date: 02/26/2021    History of Present Illness 40 year old person presented to ED 02/20/21 seeking help after drinking 2 bottles of hand sanitizer. Intubated 02/21/21-02/24/21. Post-extubation placed on HHFNC+NRB.  PMH-bipolar depression, breast cancer status post bilateral mastectomy, anxiety, depression, schizophrenia, and recent overdose suicide attempt   Clinical Impression   PTA patient was I with ADLs/IADLs. Reports living in a boarding house that had recently closed. Patient currently limited by decreased cardiopulmonary endurance requiring support from vent. Able to transition from supine <>EOB with supervision A this date, stand from EOB x2 with Min guard and take steps toward [facility] with Min guard with HHA. Further mobility deferred 2/2 ETT. Patient would benefit from continued acute OT services in prep for safe d/c. Patient likely to progress well without need for follow-up OT services.      Follow Up Recommendations  No OT follow up;Other (comment) (Inpatient psych)    Equipment Recommendations  Other (comment)    Recommendations for Other Services       Precautions / Restrictions Precautions Precautions: Other (comment) Precaution Comments: watch sats Restrictions Weight Bearing Restrictions: No      Mobility Bed Mobility Overal bed mobility: Needs Assistance Bed Mobility: Supine to Sit     Supine to sit: Supervision     General bed mobility comments: Supervision A for lines/vent    Transfers Overall transfer level: Needs assistance Equipment used: None Transfers: Sit to/from Stand;Stand Pivot Transfers Sit to Stand: Min guard Stand pivot transfers: Min guard       General transfer comment: Min guard for lines/vent. Sit to stand from EOB x2.    Balance Overall balance assessment: Independent                                          ADL either performed or assessed with clinical judgement   ADL Overall ADL's : Needs assistance/impaired                     Lower Body Dressing: Supervision/safety                 General ADL Comments: Further assessment limited as patient with ETT.     Vision   Vision Assessment?: No apparent visual deficits     Perception     Praxis      Pertinent Vitals/Pain Pain Assessment: Faces Pain Descriptors / Indicators: Headache Pain Intervention(s): Monitored during session     Hand Dominance     Extremity/Trunk Assessment Upper Extremity Assessment Upper Extremity Assessment: Overall WFL for tasks assessed   Lower Extremity Assessment Lower Extremity Assessment: Overall WFL for tasks assessed   Cervical / Trunk Assessment Cervical / Trunk Assessment: Normal   Communication Communication Communication: No difficulties   Cognition Arousal/Alertness: Awake/alert Behavior During Therapy: Anxious Overall Cognitive Status: Within Functional Limits for tasks assessed                                     General Comments       Exercises     Shoulder Instructions      Home Living Family/patient expects to be discharged to:: Unsure  Additional Comments: Inpatient psych      Prior Functioning/Environment Level of Independence: Independent                 OT Problem List: Cardiopulmonary status limiting activity      OT Treatment/Interventions: Self-care/ADL training;Therapeutic exercise;Energy conservation;DME and/or AE instruction;Therapeutic activities;Patient/family education;Balance training    OT Goals(Current goals can be found in the care plan section) Acute Rehab OT Goals Patient Stated Goal: get better OT Goal Formulation: With patient Time For Goal Achievement: 03/12/21 Potential to Achieve Goals: Good ADL Goals Pt Will Perform Upper Body Bathing: Independently Pt  Will Perform Lower Body Bathing: Independently Pt Will Perform Upper Body Dressing: Independently Pt Will Perform Lower Body Dressing: Independently Pt Will Transfer to Toilet: Independently Pt Will Perform Toileting - Clothing Manipulation and hygiene: Independently Pt Will Perform Tub/Shower Transfer: Shower transfer;ambulating;Independently  OT Frequency: Min 2X/week   Barriers to D/C:            Co-evaluation              AM-PAC OT "6 Clicks" Daily Activity     Outcome Measure Help from another person eating meals?: Total (Cortrak and ETT) Help from another person taking care of personal grooming?: A Little Help from another person toileting, which includes using toliet, bedpan, or urinal?: A Little Help from another person bathing (including washing, rinsing, drying)?: A Little Help from another person to put on and taking off regular upper body clothing?: A Little Help from another person to put on and taking off regular lower body clothing?: A Little 6 Click Score: 16   End of Session Equipment Utilized During Treatment: Gait belt;Oxygen (via vent FiO2 40%) Nurse Communication: Mobility status;Other (comment) (Response to treatment)  Activity Tolerance: Patient tolerated treatment well Patient left: in chair;with call bell/phone within reach;with bed alarm set  OT Visit Diagnosis: Muscle weakness (generalized) (M62.81)                Time: [id number] OT Time Calculation (min): 18 min Charges:  OT General Charges $OT Visit: 1 Visit OT Evaluation $OT Eval High Complexity: 1 High  [name] H. OTR/L Supplemental OT, Department of rehab services [phone]  Torre Schaumburg R H. 02/26/2021, 9:07 AM

## 2021-02-26 NOTE — Consult Note (Addendum)
Psychiatry consult progress note   Patient is intubated currently and cannot be assessed today. Patient was seen yesterday by psychiatry. Patient meets criteria for inpatient psychiatric hospitalization. Please continue the following plan as mentioned in initial consult note.   [name] is a 40 y.o. male patient with past history significant for MDD with psychotic features, bipolar disorder, previous suicide attempts (most recent in June/2022 by overdosing on Seroquel and trazodone), polysubstance abuse (crack, alcohol), history of substance-induced mood disorder, GAD, PTSD, alcohol withdrawal seizures admitted to [facility] for suicidal attempt by drinking 2 bottles of hand sanitizer and overdosed on some pills.    Plan Patient meets criteria for inpatient psychiatric Hospitalization.  Patient has been depressed and recently attempted suicide.  -Recommend inpatient psychiatric hospitalization once medically cleared. -Continue Abilify 10 mg daily. -Continue BuSpar 10 mg 3 times daily for anxiety. -Continue Lamictal 25 mg twice daily. -hold sertraline. -Continue trazodone 100 mg QHS as needed for insomnia.  -Psychiatry will sign off for now. Please reach out to [place] when Pt is medically stable for revaluation again.    Dr Armando Reichert MD PGY2 [facility] Psychiatry

## 2021-02-26 NOTE — Progress Notes (Signed)
Pt was transported to CT scan and back without complications.  

## 2021-02-26 NOTE — Progress Notes (Addendum)
NAME:  [name], MRN:  [id number], DOB:  Apr 13, 1981, LOS: 6 ADMISSION DATE:  02/19/2021, CONSULTATION DATE:  8/5 REFERRING MD:  Evette Doffing, CHIEF COMPLAINT:  Dyspnea, confusion   History of Present Illness:  40 y/o male with an extensive past medical history presented to the ER with suicidal ideation ideation who drank hand sanitizer.    Pertinent  Medical History  Angina Bipolar disorder Breast Cancer Depression History of suicide attempt Hyperlipidemia Hypertension Cirrhosis Pancreatitis Schizophrenia Alcohol abuse  Significant Hospital Events: Including procedures, antibiotic start and stop dates in addition to other pertinent events   8/5 admission 8/6 ETT >  8/6 L radial arterial line >  8/6 intubated overnight for worsening hypoxemic and work of breathing, start CRRT 8/7 Making urine; had significant hypoxemia on ABG overnight, PEEP, FiO2 increased 8/8 cont to have good UOP so CRRT is even. Starting home mood stabilizers. Incr d10 gtt. A line removed 8/9 Extubated to 15L HFNC. Restarted buspirone and hydroxyzine.  8/10 Reintubated after continued respiratory distress on maximal noninvasive respiratory support. Completed levaquin 5d course for pneumonia. 8/11 Cortrak in place for concern for colonic ileus.  Interim History / Subjective:  Reintubated after continued saturations in 80s on 40L with 100% FiO2 and NRB. Currently on vent Sats 95% Fio2 40.og placed for ileus that has been decompressed but large amounts of gas noted on recent xray. Cortrak in place but needs repositioning prior to starting tf  Objective   Blood pressure 114/76, pulse 99, temperature 99.4 F (37.4 C), temperature source Oral, resp. rate (!) 22, height '5\' 6"'$  (1.676 m), weight 68.9 kg, SpO2 (!) 88 %.    Vent Mode: PRVC FiO2 (%):  [40 %-100 %] 40 % Set Rate:  [18 bmp-22 bmp] 22 bmp Vt Set:  [510 mL] 510 mL PEEP:  [10 cmH20] 10 cmH20 Plateau Pressure:  [20 cmH20-28 cmH20] 22 cmH20    Intake/Output Summary (Last 24 hours) at 02/26/2021 0902 Last data filed at 02/26/2021 0800 Gross per 24 hour  Intake 1803.99 ml  Output 1735 ml  Net 68.99 ml    Filed Weights   02/24/21 0454 02/25/21 0630 02/26/21 0600  Weight: 78.5 kg 69 kg 68.9 kg    Examination:  General: critically ill appearing middle aged male lying in bed intubated. HENT: NCAT, ETT with OG tube and Cortrak in place. PULM: CTAB. No wheezes or crackles. Decreased breath sounds left compared to right on vent.  CV: RRR, no murmurs or gallops.  GI: soft, non-distended, non-tender.  MSK: decreased bulk, no lower extremity edema.  Neuro: follows commands, nods head to questions appropriately.   Resolved Hospital Problem list   Shock  AGMA Ketoacidosis  Acute Toxic Metabolic encephalopathy   Assessment & Plan:  Suicide Attempt: isopropyl alcohol poisoning (hand sanitizer)  Bipolar I disorder MDD with recurring SI/SA  EtOH abuse  Polysubstance use disorder (cocaine, marijuana)  -Psych will follow up once extubated.  -replace sitter again when extubated -Continue lamictal, Abilify, up-titrate per psych -Continue home buspar -PRN trazodone -PRN hydroxizine   Acute hypoxic/hypercapnic respiratory failureHCAP vs aspiration pneumoniaPulmonary edema: ABG 7.33, CO2 62.9. Poor ventilation from atelectasis versus pneumonia versus respiratory fatigue. -CTA r/o PE and better evaluate ongoing hypoxia.  -VAP, pulm hygiene  -Approaching appropriateness for WUA/SBT  -Weaning sedation as able  Suspected sepsis due to PNA -trend fever curve WBC   Acute Kidney InjuryHx renal cell carcinoma, L partial nephrectomy (2014):  -Continue to trend UOP  -foley, strict I/O -trend renal indices  Elevated LFTs, mild -trend LFTs PRN   Electrolyte imbalances HyponatremiaHypokalemiaHypomagnesemia Hypophosphatemia  -trend bmp mag phos -replete prn -adding salt tablets  Inadequate PO intakeHypermetabolic  response  -EN per RDN once cortrak replaced -reconsult speech once extubated  Hypoglycemia  -cont EN when able -continue D10 gtt    Prognosis: guarded; high risk of inpatient death from multi-organ failure  Best Practice (right click and "Reselect all SmartList Selections" daily)   Diet/type: NPO w/ meds via tube  DVT prophylaxis: prophylactic heparin  GI prophylaxis: PPI Lines: Dialysis Catheter  Foley:  Yes, and it is still needed Code Status:  full code Last date of multidisciplinary goals of care discussion [8/9 at bedside with pt, mother and father)  Labs   CBC: Recent Labs  Lab 02/20/21 0130 02/20/21 0136 02/20/21 1054 02/20/21 1214 02/21/21 0452 02/21/21 0531 02/22/21 0349 02/22/21 0425 02/22/21 0914 02/23/21 0341 02/25/21 1640  WBC 15.9*  --  9.3  --  15.8*  --   --  7.4  --  6.2  --   NEUTROABS 12.9*  --   --   --   --   --   --  6.1  --  5.1  --   HGB 12.7*   < > 13.5   < > 12.9*   < > 11.2* 11.9* 10.5* 10.7* 14.6  HCT 37.1*   < > 38.2*   < > 35.9*   < > 33.0* 32.8* 31.0* 30.2* 43.0  MCV 87.5  --  84.9  --  82.9  --   --  82.6  --  84.4  --   PLT 433*  --  465*  --  443*  --   --  235  --  179  --    < > = values in this interval not displayed.     Basic Metabolic Panel: Recent Labs  Lab 02/24/21 0543 02/24/21 0802 02/24/21 1400 02/25/21 0301 02/25/21 1640 02/25/21 1815 02/26/21 0500  NA 138 136 136 132* 131*  --  130*  K <2.0* 3.4* 4.1 4.2 3.8  --  4.1  CL 117* 102 103 93*  --   --  91*  CO2 18* '28 27 29  '$ --   --  28  GLUCOSE 76 105* 133* 107*  --   --  96  BUN '7 8 8 '$ 5*  --   --  18  CREATININE 0.55* 0.81 0.74 0.69  --   --  1.14  CALCIUM 5.2* 7.9* 7.8* 8.7*  --   --  8.3*  MG 1.3* 2.2 2.2 1.5*  --  2.0 2.1  PHOS <1.0* 3.1 3.5 2.6  --  3.3 2.6  2.5    GFR: Estimated Creatinine Clearance: 77 mL/min (by C-G formula based on SCr of 1.14 mg/dL). Recent Labs  Lab 02/20/21 1054 02/20/21 1457 02/21/21 0452 02/21/21 0453 02/21/21 1431  02/22/21 0425 02/23/21 0341  PROCALCITON  --  2.08  --   --   --   --   --   WBC 9.3  --  15.8*  --   --  7.4 6.2  LATICACIDVEN  --  3.4*  --  3.7* 2.3*  --   --      Liver Function Tests: Recent Labs  Lab 02/20/21 0130 02/20/21 1054 02/21/21 0452 02/21/21 1431 02/22/21 0425 02/22/21 1152 02/23/21 0341 02/24/21 0543 02/25/21 0301 02/26/21 0500  AST 66* 50* 56*  --  50*  --  53*  --   --   --  ALT 84* 77* 68*  --  56*  --  57*  --   --   --   ALKPHOS 40 38 42  --  45  --  45  --   --   --   BILITOT 0.6 0.5 0.7  --  1.0  --  0.8  --   --   --   PROT 6.2* 6.1* 5.8*  --  5.4*  --  5.2*  --   --   --   ALBUMIN 3.4* 3.3* 2.9*   < > 2.3*  2.3* 2.0* 2.2*  2.2* 1.3* 2.7* 2.1*   < > = values in this interval not displayed.    Recent Labs  Lab 02/19/21 0939 02/20/21 1457  LIPASE 32 33  AMYLASE  --  40    No results for input(s): AMMONIA in the last 168 hours.  ABG    Component Value Date/Time   PHART 7.331 (L) 02/25/2021 1640   PCO2ART 62.9 (H) 02/25/2021 1640   PO2ART 302 (H) 02/25/2021 1640   HCO3 33.3 (H) 02/25/2021 1640   TCO2 35 (H) 02/25/2021 1640   ACIDBASEDEF 1.0 02/22/2021 0914   O2SAT 100.0 02/25/2021 1640      Coagulation Profile: Recent Labs  Lab 02/20/21 1457 02/21/21 0452  INR 1.2 1.2     Cardiac Enzymes: No results for input(s): CKTOTAL, CKMB, CKMBINDEX, TROPONINI in the last 168 hours.  HbA1C: Hgb A1c MFr Bld  Date/Time Value Ref Range Status  01/08/2021 06:32 PM 4.6 (L) 4.8 - 5.6 % Final    Comment:    (NOTE) Pre diabetes:          5.7%-6.4%  Diabetes:              >6.4%  Glycemic control for   <7.0% adults with diabetes   07/30/2018 06:28 AM 4.9 4.8 - 5.6 % Final    Comment:    (NOTE) Pre diabetes:          5.7%-6.4% Diabetes:              >6.4% Glycemic control for   <7.0% adults with diabetes     CBG: Recent Labs  Lab 02/25/21 1543 02/25/21 1938 02/25/21 2359 02/26/21 0332 02/26/21 0736  GLUCAP 157* 145* 93  95 70        Critical care time: 36 minutes   Critical care time: The patient is critically ill with multiple organ systems failure and requires high complexity decision making for assessment and support, frequent evaluation and titration of therapies, application of advanced monitoring technologies and extensive interpretation of multiple databases.  Critical care time 31mns. This represents my time independent of the NPs time taking care of the pt. This is excluding procedures.    JAudria NineDO [facility] 02/26/2021, 1:46 PM See Amion for pager If no response to pager, please call [phone] until 1900 After 1900 please call EThe [phone]

## 2021-02-26 NOTE — Progress Notes (Signed)
SLP Cancellation Note  Patient Details Name: [name] MRN: [id number] DOB: 10-Jul-1981   Cancelled treatment:       Reason Eval/Treat Not Completed: Medical issues which prohibited therapy. Pt reintubated yesterday. Will sign off and await new orders.  Nicoya Friel L. Tivis Ringer, [place] CCC/SLP Acute Rehabilitation Services Office number [phone] Pager [phone]    Juan Quam Laurice 02/26/2021, 12:24 PM

## 2021-02-27 ENCOUNTER — Inpatient Hospital Stay (HOSPITAL_COMMUNITY): Payer: Self-pay

## 2021-02-27 LAB — GLUCOSE, CAPILLARY
Glucose-Capillary: 104 mg/dL — ABNORMAL HIGH (ref 70–99)
Glucose-Capillary: 109 mg/dL — ABNORMAL HIGH (ref 70–99)
Glucose-Capillary: 82 mg/dL (ref 70–99)
Glucose-Capillary: 88 mg/dL (ref 70–99)
Glucose-Capillary: 98 mg/dL (ref 70–99)
Glucose-Capillary: 99 mg/dL (ref 70–99)

## 2021-02-27 LAB — MAGNESIUM: Magnesium: 2.1 mg/dL (ref 1.7–2.4)

## 2021-02-27 LAB — RENAL FUNCTION PANEL
Albumin: 2.1 g/dL — ABNORMAL LOW (ref 3.5–5.0)
Anion gap: 9 (ref 5–15)
BUN: 25 mg/dL — ABNORMAL HIGH (ref 6–20)
CO2: 26 mmol/L (ref 22–32)
Calcium: 8.5 mg/dL — ABNORMAL LOW (ref 8.9–10.3)
Chloride: 97 mmol/L — ABNORMAL LOW (ref 98–111)
Creatinine, Ser: 1.32 mg/dL — ABNORMAL HIGH (ref 0.61–1.24)
GFR, Estimated: >60 mL/min (ref >60)
Glucose, Bld: 108 mg/dL — ABNORMAL HIGH (ref 70–99)
Phosphorus: 3.8 mg/dL (ref 2.5–4.6)
Potassium: 3.5 mmol/L (ref 3.5–5.1)
Sodium: 132 mmol/L — ABNORMAL LOW (ref 135–145)

## 2021-02-27 LAB — LEGIONELLA PNEUMOPHILA SEROGP 1 UR AG: L. pneumophila Serogp 1 Ur Ag: NEGATIVE

## 2021-02-27 MED ORDER — POTASSIUM CHLORIDE 20 MEQ PO PACK
40.0000 meq | PACK | Freq: Once | ORAL | Status: AC
  Administered 2021-02-27: 40 meq
  Filled 2021-02-27: qty 2

## 2021-02-27 MED ORDER — PROSOURCE TF PO LIQD
45.0000 mL | Freq: Two times a day (BID) | ORAL | Status: DC
  Administered 2021-02-27: 45 mL
  Filled 2021-02-27: qty 45

## 2021-02-27 MED ORDER — PROSOURCE TF PO LIQD
45.0000 mL | Freq: Three times a day (TID) | ORAL | Status: DC
  Administered 2021-02-27 – 2021-02-28 (×3): 45 mL
  Filled 2021-02-27 (×3): qty 45

## 2021-02-27 MED ORDER — ORAL CARE MOUTH RINSE
15.0000 mL | OROMUCOSAL | Status: DC
  Administered 2021-02-27 – 2021-02-28 (×17): 15 mL via OROMUCOSAL

## 2021-02-27 MED ORDER — ROSUVASTATIN CALCIUM 20 MG PO TABS
10.0000 mg | ORAL_TABLET | Freq: Every day | ORAL | Status: DC
  Administered 2021-02-27 – 2021-03-01 (×3): 10 mg
  Filled 2021-02-27 (×3): qty 1

## 2021-02-27 MED ORDER — VITAL HIGH PROTEIN PO LIQD: 1000.0000 mL | ORAL | Status: DC

## 2021-02-27 MED ORDER — SERTRALINE HCL 25 MG PO TABS
25.0000 mg | ORAL_TABLET | Freq: Every day | ORAL | Status: DC
  Administered 2021-02-27: 25 mg
  Filled 2021-02-27: qty 1

## 2021-02-27 MED ORDER — FUROSEMIDE 10 MG/ML IJ SOLN
40.0000 mg | Freq: Once | INTRAMUSCULAR | Status: AC
  Administered 2021-02-27: 40 mg via INTRAVENOUS
  Filled 2021-02-27: qty 4

## 2021-02-27 MED ORDER — METOCLOPRAMIDE HCL 5 MG/ML IJ SOLN
5.0000 mg | Freq: Once | INTRAMUSCULAR | Status: AC
  Administered 2021-02-27: 5 mg via INTRAVENOUS
  Filled 2021-02-27: qty 2

## 2021-02-27 MED ORDER — VITAL 1.5 CAL PO LIQD
1000.0000 mL | ORAL | Status: DC
  Administered 2021-02-27: 1000 mL

## 2021-02-27 NOTE — Progress Notes (Signed)
Pharmacy Electrolyte Replacement  Recent Labs:  Recent Labs    02/27/21 0323  K 3.5  MG 2.1  PHOS 3.8  CREATININE 1.32*    No critical values noted.   Plan: Supplement K 40 mEq via tube   Adria Dill, PharmD PGY-1 Acute Care Resident  02/27/2021 11:44 AM

## 2021-02-27 NOTE — Progress Notes (Signed)
NAME:  [name], MRN:  [id number], DOB:  [birth year], LOS: 7 ADMISSION DATE:  02/19/2021, CONSULTATION DATE:  8/5 REFERRING MD:  Evette Doffing, CHIEF COMPLAINT:  Dyspnea, confusion   History of Present Illness:  40 y/o male with an extensive past medical history presented to the ER with suicidal ideation ideation who drank hand sanitizer.    Pertinent  Medical History  Bipolar, Breast cancer, Depression with suicide attempt, HLD, HTN, Cirrhosis, Pancreatitis, Schizophrenia, ETOH  Significant Hospital Events: Including procedures, antibiotic start and stop dates in addition to other pertinent events   8/5 admission 8/6 Intubated, start CRRT 8/7 Increased PEEP/FiO2 needs; increased urine outpt 8/9 Extubated to 15L HFNC. Restarted buspirone and hydroxyzine.  8/10 Reintubated after continued respiratory distress on maximal noninvasive respiratory support. Completed levaquin 5d course for pneumonia. 8/11 Ileus  Interim History / Subjective:  Remains on increased PEEP/FiO2, sedation.  Objective   Blood pressure 112/73, pulse 78, temperature 99.5 F (37.5 C), temperature source Oral, resp. rate (!) 22, height '5\' 6"'$  (1.676 m), weight 70 kg, SpO2 94 %.    Vent Mode: PRVC FiO2 (%):  [50 %-70 %] 70 % Set Rate:  [22 bmp] 22 bmp Vt Set:  [510 mL] 510 mL PEEP:  [10 cmH20] 10 cmH20 Plateau Pressure:  [16 cmH20-27 cmH20] 27 cmH20   Intake/Output Summary (Last 24 hours) at 02/27/2021 [id number] Last data filed at 02/27/2021 0600 Gross per 24 hour  Intake 2270.05 ml  Output 1740 ml  Net 530.05 ml   Filed Weights   02/26/21 0600 02/27/21 0000 02/27/21 0410  Weight: 68.9 kg 70 kg 70 kg    Examination:  General - sedated Eyes - pupils reactive ENT - ETT in place Cardiac - regular rate/rhythm, no murmur Chest - b/l rhonchi Abdomen - soft, non tender, + bowel sounds Extremities - no cyanosis, clubbing, or edema Skin - no rashes Neuro - RASS -1, moves extremities, follows commands  CT angio  chest 8/11 - lower consolidation with extensive GGO  Resolved Hospital Problem list   Shock, AGMA, Ketoacidosis, Acute Toxic Metabolic encephalopathy, Sepsis from pneumonia, AKI from ATN 2nd to sepsis, Elevated LFTs from sepsis  Assessment & Plan:   Acute hypoxic/hypercapnic respiratory failure with ARDS from aspiration pneumonitis. - adjust PEEP/FiO2 to keep SpO2 90 to 95% - goal plateau pressure < 30, driving force < 15 - f/u CXR - completed ABx - lasix 40 mg IV x one on 8/12  Intentional overdose with isopropyl alcohol. Hx of Bipolar, depression, schizophrenia, polysubstance abuse (cocaine, THC, ETOH). - RASS goal 0 to -1  - continue abilify, buspar, klonopin, lamictal, oxycodone - prn trazodone qhs - f/u with psychiatry  Hx of renal cell carcinoma s/p Lt partial nephrectomy. Urine obstruction. - f/u BMET, monitor urine outpt - keep foley in for now  Best Practice (right click and "Reselect all SmartList Selections" daily)   Diet/type: tube feeds DVT prophylaxis: SQ heparin GI prophylaxis: protonix Code Status:  full code Last date of multidisciplinary goals of care discussion [8/9 at bedside with pt, mother and father)  Labs    CMP Latest Ref Rng & Units 02/27/2021 02/26/2021 02/25/2021  Glucose 70 - 99 mg/dL 108(H) 96 -  BUN 6 - 20 mg/dL 25(H) 18 -  Creatinine 0.61 - 1.24 mg/dL 1.32(H) 1.14 -  Sodium 135 - 145 mmol/L 132(L) 130(L) 131(L)  Potassium 3.5 - 5.1 mmol/L 3.5 4.1 3.8  Chloride 98 - 111 mmol/L 97(L) 91(L) -  CO2 22 - 32 mmol/L 26  28 -  Calcium 8.9 - 10.3 mg/dL 8.5(L) 8.3(L) -  Total Protein 6.5 - 8.1 g/dL - - -  Total Bilirubin 0.3 - 1.2 mg/dL - - -  Alkaline Phos 38 - 126 U/L - - -  AST 15 - 41 U/L - - -  ALT 0 - 44 U/L - - -    CBC Latest Ref Rng & Units 02/25/2021 02/23/2021 02/22/2021  WBC 4.0 - 10.5 K/uL - 6.2 -  Hemoglobin 13.0 - 17.0 g/dL 14.6 10.7(L) 10.5(L)  Hematocrit 39.0 - 52.0 % 43.0 30.2(L) 31.0(L)  Platelets 150 - 400 K/uL - 179 -     ABG    Component Value Date/Time   PHART 7.331 (L) 02/25/2021 1640   PCO2ART 62.9 (H) 02/25/2021 1640   PO2ART 302 (H) 02/25/2021 1640   HCO3 33.3 (H) 02/25/2021 1640   TCO2 35 (H) 02/25/2021 1640   ACIDBASEDEF 1.0 02/22/2021 0914   O2SAT 100.0 02/25/2021 1640    CBG (last 3)  Recent Labs    02/26/21 2322 02/27/21 0336 02/27/21 0803  GLUCAP 133* 104* 88   Critical care time: 36 minutes  Chesley Mires, MD [place] Pager - [phone] 02/27/2021, 8:39 AM

## 2021-02-27 NOTE — Procedures (Addendum)
Cortrak  Tube Type:  Cortrak - 43 inches Tube Location:  Left nare Initial Placement:  Postpyloric Secured by: Bridle Technique Used to Measure Tube Placement:  Marking at nare/corner of mouth Cortrak Secured At:  75 cm Cortrak Tube Team Note:  Consult received to advance Cortrak feeding tube post pyloric. RD unable to advance tube past the first portion of the duodenum. If post pyloric tube needs further advancement, recommend fluoroscopy guided advancement of tube.   X-ray is required, abdominal x-ray has been ordered by the Cortrak team. Please confirm tube placement before using the Cortrak tube.   If the tube becomes dislodged please keep the tube and contact the Cortrak team at [email or web address] (password [email or web address]) for replacement.  If after hours and replacement cannot be delayed, place a NG tube and confirm placement with an abdominal x-ray.   Koleen Distance MS, RD, LDN Please refer to [facility] for RD and/or RD on-call/weekend/after hours pager

## 2021-02-28 ENCOUNTER — Inpatient Hospital Stay (HOSPITAL_COMMUNITY): Payer: Self-pay

## 2021-02-28 LAB — BASIC METABOLIC PANEL
Anion gap: 11 (ref 5–15)
BUN: 32 mg/dL — ABNORMAL HIGH (ref 6–20)
CO2: 22 mmol/L (ref 22–32)
Calcium: 8.7 mg/dL — ABNORMAL LOW (ref 8.9–10.3)
Chloride: 103 mmol/L (ref 98–111)
Creatinine, Ser: 1.13 mg/dL (ref 0.61–1.24)
GFR, Estimated: >60 mL/min (ref >60)
Glucose, Bld: 108 mg/dL — ABNORMAL HIGH (ref 70–99)
Potassium: 4 mmol/L (ref 3.5–5.1)
Sodium: 136 mmol/L (ref 135–145)

## 2021-02-28 LAB — GLUCOSE, CAPILLARY
Glucose-Capillary: 112 mg/dL — ABNORMAL HIGH (ref 70–99)
Glucose-Capillary: 107 mg/dL — ABNORMAL HIGH (ref 70–99)
Glucose-Capillary: 111 mg/dL — ABNORMAL HIGH (ref 70–99)
Glucose-Capillary: 108 mg/dL — ABNORMAL HIGH (ref 70–99)
Glucose-Capillary: 125 mg/dL — ABNORMAL HIGH (ref 70–99)
Glucose-Capillary: 81 mg/dL (ref 70–99)

## 2021-02-28 LAB — CBC
HCT: 35.4 % — ABNORMAL LOW (ref 39.0–52.0)
Hemoglobin: 12.2 g/dL — ABNORMAL LOW (ref 13.0–17.0)
MCH: 29.7 pg (ref 26.0–34.0)
MCHC: 34.5 g/dL (ref 30.0–36.0)
MCV: 86.1 fL (ref 80.0–100.0)
Platelets: 210 10*3/uL (ref 150–400)
RBC: 4.11 MIL/uL — ABNORMAL LOW (ref 4.22–5.81)
RDW: 13.7 % (ref 11.5–15.5)
WBC: 14.6 10*3/uL — ABNORMAL HIGH (ref 4.0–10.5)
nRBC: 0 % (ref 0.0–0.2)

## 2021-02-28 LAB — PHOSPHORUS: Phosphorus: 3.9 mg/dL (ref 2.5–4.6)

## 2021-02-28 LAB — MAGNESIUM: Magnesium: 1.9 mg/dL (ref 1.7–2.4)

## 2021-02-28 MED ORDER — ACETAMINOPHEN 325 MG PO TABS
650.0000 mg | ORAL_TABLET | Freq: Four times a day (QID) | ORAL | Status: DC | PRN
  Administered 2021-02-28: 650 mg
  Filled 2021-02-28: qty 2

## 2021-02-28 MED ORDER — ARIPIPRAZOLE 5 MG PO TABS
15.0000 mg | ORAL_TABLET | Freq: Every day | ORAL | Status: DC
  Administered 2021-03-01: 15 mg
  Filled 2021-02-28 (×2): qty 1

## 2021-02-28 MED ORDER — FENTANYL CITRATE (PF) 100 MCG/2ML IJ SOLN: 25.0000 ug | INTRAMUSCULAR | Status: DC | PRN

## 2021-02-28 MED ORDER — CHLORHEXIDINE GLUCONATE 0.12 % MT SOLN
OROMUCOSAL | Status: AC
  Administered 2021-02-28: 15 mL via OROMUCOSAL
  Filled 2021-02-28: qty 15

## 2021-02-28 MED ORDER — ORAL CARE MOUTH RINSE
15.0000 mL | Freq: Two times a day (BID) | OROMUCOSAL | Status: DC
  Administered 2021-02-28 – 2021-03-04 (×7): 15 mL via OROMUCOSAL

## 2021-02-28 NOTE — Plan of Care (Signed)
  Problem: Education: Goal: Knowledge of General Education information will improve Description: Including pain rating scale, medication(s)/side effects and non-pharmacologic comfort measures Outcome: Progressing   Problem: Health Behavior/Discharge Planning: Goal: Ability to manage health-related needs will improve Outcome: Progressing   Problem: Clinical Measurements: Goal: Ability to maintain clinical measurements within normal limits will improve Outcome: Progressing Goal: Will remain free from infection Outcome: Progressing Goal: Diagnostic test results will improve Outcome: Progressing Goal: Respiratory complications will improve Outcome: Progressing Goal: Cardiovascular complication will be avoided Outcome: Progressing   Problem: Activity: Goal: Risk for activity intolerance will decrease Outcome: Progressing   Problem: Nutrition: Goal: Adequate nutrition will be maintained Outcome: Progressing   Problem: Coping: Goal: Level of anxiety will decrease Outcome: Progressing   Problem: Elimination: Goal: Will not experience complications related to bowel motility Outcome: Progressing Goal: Will not experience complications related to urinary retention Outcome: Progressing   Problem: Pain Managment: Goal: General experience of comfort will improve Outcome: Progressing   Problem: Safety: Goal: Ability to remain free from injury will improve Outcome: Progressing   Problem: Skin Integrity: Goal: Risk for impaired skin integrity will decrease Outcome: Progressing   Problem: Activity: Goal: Ability to tolerate increased activity will improve Outcome: Progressing   Problem: Respiratory: Goal: Ability to maintain a clear airway and adequate ventilation will improve Outcome: Progressing   Problem: Role Relationship: Goal: Method of communication will improve Outcome: Progressing   Problem: Education: Goal: Knowledge of disease and its progression will  improve Outcome: Progressing   Problem: Health Behavior/Discharge Planning: Goal: Ability to manage health-related needs will improve Outcome: Progressing   Problem: Clinical Measurements: Goal: Complications related to the disease process or treatment will be avoided or minimized Outcome: Progressing Goal: Dialysis access will remain free of complications Outcome: Progressing   Problem: Activity: Goal: Activity intolerance will improve Outcome: Progressing   Problem: Fluid Volume: Goal: Fluid volume balance will be maintained or improved Outcome: Progressing   Problem: Nutritional: Goal: Ability to make appropriate dietary choices will improve Outcome: Progressing   Problem: Respiratory: Goal: Respiratory symptoms related to disease process will be avoided Outcome: Progressing   Problem: Self-Concept: Goal: Body image disturbance will be avoided or minimized Outcome: Progressing   Problem: Urinary Elimination: Goal: Progression of disease will be identified and treated Outcome: Progressing   Problem: Safety: Goal: Non-violent Restraint(s) Outcome: Progressing
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
## 2021-02-28 NOTE — Procedures (Signed)
Extubation Procedure Note  Patient Details:   Name: [name] DOB: [birth year] MRN: [id number]   Airway Documentation:    Vent end date: 02/28/21 Vent end time: 1055   Evaluation  O2 sats: stable throughout Complications: No apparent complications Patient did tolerate procedure well. Bilateral Breath Sounds: Rhonchi   Yes  Pt extubated to 8L HFNC,cuff leak present, no stridor noted, RN at bedside, MD aware, RT will continue to monitor.   Geni Bers Irvin Bastin 02/28/2021, 11:12 AM

## 2021-02-28 NOTE — Progress Notes (Signed)
NAME:  [name], MRN:  [id number], DOB:  01-Jun-1981, LOS: 8 ADMISSION DATE:  02/19/2021, CONSULTATION DATE:  8/5 REFERRING MD:  Evette Doffing, CHIEF COMPLAINT:  Dyspnea, confusion   History of Present Illness:  40 y/o male with an extensive past medical history presented to the ER with suicidal ideation ideation who drank hand sanitizer.   UDS+ cocaine, BDZ  Pertinent  Medical History  Bipolar, Breast cancer, Depression with suicide attempt, HLD, HTN, Cirrhosis, Pancreatitis, Schizophrenia, ETOH  Significant Hospital Events: Including procedures, antibiotic start and stop dates in addition to other pertinent events   8/5 admission 8/6 Intubated, start CRRT 8/7 Increased PEEP/FiO2 needs; increased urine outpt 8/9 Extubated to 15L HFNC. Restarted buspirone and hydroxyzine.  8/10 Reintubated after continued respiratory distress on maximal noninvasive respiratory support. Completed levaquin 5d course for pneumonia. 8/11 Ileus  Interim History / Subjective:   Remains critically ill, intubated Calm on Precedex Good urine output   Objective   Blood pressure 103/67, pulse 68, temperature 99.3 F (37.4 C), temperature source Oral, resp. rate (!) 22, height '5\' 6"'$  (1.676 m), weight 68 kg, SpO2 100 %.    Vent Mode: PSV;CPAP FiO2 (%):  [40 %-70 %] 40 % Set Rate:  [22 bmp] 22 bmp Vt Set:  [510 mL] 510 mL PEEP:  [5 cmH20-10 cmH20] 5 cmH20 Pressure Support:  [10 cmH20] 10 cmH20 Plateau Pressure:  [21 cmH20-24 cmH20] 23 cmH20   Intake/Output Summary (Last 24 hours) at 02/28/2021 [id number] Last data filed at 02/28/2021 0700 Gross per 24 hour  Intake 1811.22 ml  Output 2150 ml  Net -338.78 ml    Filed Weights   02/27/21 0000 02/27/21 0410 02/28/21 0500  Weight: 70 kg 70 kg 68 kg    Examination:  General -awake, chronically ill, no distress Eyes - pupils reactive, no pallor or icterus ENT -no JVD Cardiac - regular rate/rhythm, no murmur Chest -no accessory muscle use, no  rhonchi  Abdomen - soft, non tender, + bowel sounds Extremities - no cyanosis, clubbing, or edema Skin - no rashes Neuro - RASS 0, alert and interactive, nonfocal  CT angio chest 8/11 - lower consolidation with extensive GGO Chest x-ray 8/13 independently reviewed, stable bilateral airspace disease, large bowel ileus pattern persists  Labs show nml lytes, mild leucocytosis  Resolved Hospital Problem list   Shock, AGMA, Ketoacidosis, Acute Toxic Metabolic encephalopathy, Sepsis from pneumonia, AKI from ATN 2nd to sepsis, Elevated LFTs from sepsis  Assessment & Plan:   Acute hypoxic/hypercapnic respiratory failure with ARDS from aspiration pneumonitis. - Lower PEEP to 5, statrt spont breathing trials with goal extuabtion - completed ABx   Intentional overdose with isopropyl alcohol. Hx of Bipolar, depression, schizophrenia, polysubstance abuse (cocaine, THC, ETOH). - RASS goal 0 to -1 , minimise precedex -dc oxycocone - continue abilify, buspar, klonopin, lamictal, oxycodone -dc zoloft per psych - prn trazodone qhs - f/u with psychiatry when extubated  Hx of renal cell carcinoma s/p Lt partial nephrectomy. Urine obstruction. - f/u BMET, monitor urine outpt - keep foley in for now  Best Practice (right click and "Reselect all SmartList Selections" daily)   Diet/type: tube feeds DVT prophylaxis: SQ heparin GI prophylaxis: protonix Code Status:  full code Last date of multidisciplinary goals of care discussion [8/9 at bedside with pt, mother and father)  Labs    CMP Latest Ref Rng & Units 02/28/2021 02/27/2021 02/26/2021  Glucose 70 - 99 mg/dL 108(H) 108(H) 96  BUN 6 - 20 mg/dL 32(H) 25(H) 18  Creatinine  0.61 - 1.24 mg/dL 1.13 1.32(H) 1.14  Sodium 135 - 145 mmol/L 136 132(L) 130(L)  Potassium 3.5 - 5.1 mmol/L 4.0 3.5 4.1  Chloride 98 - 111 mmol/L 103 97(L) 91(L)  CO2 22 - 32 mmol/L '22 26 28  '$ Calcium 8.9 - 10.3 mg/dL 8.7(L) 8.5(L) 8.3(L)  Total Protein 6.5 - 8.1 g/dL - -  -  Total Bilirubin 0.3 - 1.2 mg/dL - - -  Alkaline Phos 38 - 126 U/L - - -  AST 15 - 41 U/L - - -  ALT 0 - 44 U/L - - -    CBC Latest Ref Rng & Units 02/28/2021 02/25/2021 02/23/2021  WBC 4.0 - 10.5 K/uL 14.6(H) - 6.2  Hemoglobin 13.0 - 17.0 g/dL 12.2(L) 14.6 10.7(L)  Hematocrit 39.0 - 52.0 % 35.4(L) 43.0 30.2(L)  Platelets 150 - 400 K/uL 210 - 179    ABG    Component Value Date/Time   PHART 7.331 (L) 02/25/2021 1640   PCO2ART 62.9 (H) 02/25/2021 1640   PO2ART 302 (H) 02/25/2021 1640   HCO3 33.3 (H) 02/25/2021 1640   TCO2 35 (H) 02/25/2021 1640   ACIDBASEDEF 1.0 02/22/2021 0914   O2SAT 100.0 02/25/2021 1640    CBG (last 3)  Recent Labs    02/27/21 2337 02/28/21 0330 02/28/21 0747  GLUCAP 109* 112* 107*    Critical care time: 34 minutes    Kara Mead MD. FCCP. [facility] Pager : [phone]  If no response to pager , please call [phone] until 7 pm After 7:00 pm call Elink  [phone]     02/28/2021, 9:05 AM

## 2021-03-01 ENCOUNTER — Inpatient Hospital Stay (HOSPITAL_COMMUNITY): Payer: Self-pay

## 2021-03-01 DIAGNOSIS — M79601 Pain in right arm: Secondary | ICD-10-CM

## 2021-03-01 DIAGNOSIS — T490X2D Poisoning by local antifungal, anti-infective and anti-inflammatory drugs, intentional self-harm, subsequent encounter: Secondary | ICD-10-CM

## 2021-03-01 DIAGNOSIS — M7989 Other specified soft tissue disorders: Secondary | ICD-10-CM

## 2021-03-01 LAB — BASIC METABOLIC PANEL
Anion gap: 7 (ref 5–15)
BUN: 12 mg/dL (ref 6–20)
CO2: 28 mmol/L (ref 22–32)
Calcium: 8.4 mg/dL — ABNORMAL LOW (ref 8.9–10.3)
Chloride: 100 mmol/L (ref 98–111)
Creatinine, Ser: 0.79 mg/dL (ref 0.61–1.24)
GFR, Estimated: >60 mL/min (ref >60)
Glucose, Bld: 135 mg/dL — ABNORMAL HIGH (ref 70–99)
Potassium: 3.7 mmol/L (ref 3.5–5.1)
Sodium: 135 mmol/L (ref 135–145)

## 2021-03-01 LAB — GLUCOSE, CAPILLARY
Glucose-Capillary: 101 mg/dL — ABNORMAL HIGH (ref 70–99)
Glucose-Capillary: 93 mg/dL (ref 70–99)
Glucose-Capillary: 93 mg/dL (ref 70–99)
Glucose-Capillary: 143 mg/dL — ABNORMAL HIGH (ref 70–99)
Glucose-Capillary: 139 mg/dL — ABNORMAL HIGH (ref 70–99)

## 2021-03-01 LAB — CBC
HCT: 41.6 % (ref 39.0–52.0)
Hemoglobin: 13.8 g/dL (ref 13.0–17.0)
MCH: 28.5 pg (ref 26.0–34.0)
MCHC: 33.2 g/dL (ref 30.0–36.0)
MCV: 86 fL (ref 80.0–100.0)
Platelets: UNDETERMINED 10*3/uL (ref 150–400)
RBC: 4.84 MIL/uL (ref 4.22–5.81)
RDW: 13.7 % (ref 11.5–15.5)
WBC: 19.5 10*3/uL — ABNORMAL HIGH (ref 4.0–10.5)
nRBC: 0 % (ref 0.0–0.2)

## 2021-03-01 MED ORDER — AMLODIPINE BESYLATE 5 MG PO TABS
5.0000 mg | ORAL_TABLET | Freq: Every day | ORAL | Status: DC
  Administered 2021-03-01 – 2021-03-04 (×4): 5 mg via ORAL
  Filled 2021-03-01 (×4): qty 1

## 2021-03-01 NOTE — Progress Notes (Signed)
PROGRESS NOTE    [name]  [id number] DOB: 1981-02-14 DOA: 02/19/2021 PCP: Maudie Mercury, MD    Chief Complaint  Patient presents with   Abdominal Pain   Chest Pain   Suicidal    Brief Narrative:   40 y/o male with an extensive past medical history presented to the ER with suicidal ideation ideation drank hand sanitizer.   UDS+ cocaine, BDZ. Assessment & Plan:   Principal Problem:   Hand sanitizer poisoning Active Problems:   Depression, major, severe recurrence (HCC)   Respiratory failure (HCC)   Metabolic acidosis   Suicide attempt ([place])   AKI (acute kidney injury) ([place])   Acute respiratory failure with hypoxemia (HCC)    Acute hypoxic and hypercapnic respiratory failure with ARDS WITH aspiration pneumonitis:  - extubated. And completed antibiotics.  - repeat CXR today.  - wean him off oxygen as appropriate.    Intentional overdose with iso prophyl alcohol:  In the setting of bipolar disorder, depression, schizophrenia, polysubstance abuse  UDS is positive for cocaine and benzodiazepines.  Psychiatry consulted and pt will need inpatient psychiatry admission.  Continue with Buspar, klonopin, Abilify and Lamictal.    Renal cell ca s/p Lt partial nephrectomy;  - monitor urine output.    Right upper extremity swelling and tenderness at the site of the IV line.  Get venous duplex to check for DVT.    Leukocytosis:  Possibly from aspiration pneumonitis.  Monitor.    AKI Resolved.      DVT prophylaxis: (Heparin/) Code Status: (Full CODE) Family Communication: NONE AT BEDSIDE.  Disposition:   Status is: Inpatient  Remains inpatient appropriate because:Ongoing diagnostic testing needed not appropriate for outpatient work up and Unsafe d/c plan  Dispo: The patient is from: Home              Anticipated d/c is to:  [facility]              Patient currently is not medically stable to d/c.   Difficult to place patient No       Consultants:   Psychiatry.  PCCM.   Procedures: / significant events  8/5 admission 8/6 Intubated, start CRRT 8/7 Increased PEEP/FiO2 needs; increased urine outpt 8/9 Extubated to 15L HFNC. Restarted buspirone and hydroxyzine.  8/10 Reintubated after continued respiratory distress on maximal noninvasive respiratory support. Completed levaquin 5d course for pneumonia. 8/11 Ileus 8/13 extubated and on 6 lit of [place] oxygen. Large bowel movement.   Antimicrobials:  Antibiotics Given (last 72 hours)     None         Subjective: No chest pain or sob.  Vomited this am.  Wants to eat.   Objective: Vitals:   03/01/21 0810 03/01/21 0900 03/01/21 1000 03/01/21 1146  BP:  (!) 142/87 (!) 158/95   Pulse:  99 (!) 117   Resp:  (!) 42 (!) 33   Temp: 99.9 F (37.7 C)   100.1 F (37.8 C)  TempSrc: Axillary   Axillary  SpO2:  97% 100%   Weight:      Height:        Intake/Output Summary (Last 24 hours) at 03/01/2021 1541 Last data filed at 03/01/2021 0900 Gross per 24 hour  Intake 600 ml  Output 950 ml  Net -350 ml   Filed Weights   02/27/21 0410 02/28/21 0500 03/01/21 0405  Weight: 70 kg 68 kg 69 kg    Examination:  General exam: alert and doe snot appear to be in distress.  Respiratory system: diminished air entry at bases, no wheezing heard.  On 6 lit of [place] oxygen.  Cardiovascular system: S1 & S2 heard, tachycardic.  No JVD,  No pedal edema. Gastrointestinal system: Abdomen is nondistended, soft and nontender. Normal bowel sounds heard. Central nervous system: Alert and oriented. No focal neurological deficits. Extremities: Symmetric 5 x 5 power. Skin: No rashes, lesions or ulcers Psychiatry:  anxious.     Data Reviewed: I have personally reviewed following labs and imaging studies  CBC: Recent Labs  Lab 02/23/21 0341 02/25/21 1640 02/28/21 0110 03/01/21 0558  WBC 6.2  --  14.6* 19.5*  NEUTROABS 5.1  --   --   --   HGB 10.7* 14.6 12.2* 13.8  HCT 30.2* 43.0 35.4* 41.6  MCV  84.4  --  86.1 86.0  PLT 179  --  210 PLATELET CLUMPS NOTED ON SMEAR, UNABLE TO ESTIMATE    Basic Metabolic Panel: Recent Labs  Lab 02/24/21 1400 02/25/21 0301 02/25/21 1640 02/25/21 1815 02/26/21 0500 02/26/21 1754 02/27/21 0323 02/28/21 0110  NA 136 132* 131*  --  130*  --  132* 136  K 4.1 4.2 3.8  --  4.1  --  3.5 4.0  CL 103 93*  --   --  91*  --  97* 103  CO2 27 29  --   --  28  --  26 22  GLUCOSE 133* 107*  --   --  96  --  108* 108*  BUN 8 5*  --   --  18  --  25* 32*  CREATININE 0.74 0.69  --   --  1.14  --  1.32* 1.13  CALCIUM 7.8* 8.7*  --   --  8.3*  --  8.5* 8.7*  MG 2.2 1.5*  --  2.0 2.1 2.1 2.1 1.9  PHOS 3.5 2.6  --  3.3 2.6  2.5 3.1 3.8 3.9    GFR: Estimated Creatinine Clearance: 77.6 mL/min (by C-G formula based on SCr of 1.13 mg/dL).  Liver Function Tests: Recent Labs  Lab 02/23/21 0341 02/24/21 0543 02/25/21 0301 02/26/21 0500 02/27/21 0323  AST 53*  --   --   --   --   ALT 57*  --   --   --   --   ALKPHOS 45  --   --   --   --   BILITOT 0.8  --   --   --   --   PROT 5.2*  --   --   --   --   ALBUMIN 2.2*  2.2* 1.3* 2.7* 2.1* 2.1*    CBG: Recent Labs  Lab 02/28/21 2326 03/01/21 0419 03/01/21 0744 03/01/21 1132 03/01/21 1517  GLUCAP 81 101* 93 93 143*     Recent Results (from the past 240 hour(s))  Resp Panel by RT-PCR (Flu A&B, Covid) Nasopharyngeal Swab     Status: None   Collection Time: 02/19/21  9:17 PM   Specimen: Nasopharyngeal Swab; Nasopharyngeal(NP) swabs in vial transport medium  Result Value Ref Range Status   SARS Coronavirus 2 by RT PCR NEGATIVE NEGATIVE Final    Comment: (NOTE) SARS-CoV-2 target nucleic acids are NOT DETECTED.  The SARS-CoV-2 RNA is generally detectable in upper respiratory specimens during the acute phase of infection. The lowest concentration of SARS-CoV-2 viral copies this assay can detect is 138 copies/mL. A negative result does not preclude SARS-Cov-2 infection and should not be used as the  sole basis for  treatment or other patient management decisions. A negative result may occur with  improper specimen collection/handling, submission of specimen other than nasopharyngeal swab, presence of viral mutation(s) within the areas targeted by this assay, and inadequate number of viral copies(<138 copies/mL). A negative result must be combined with clinical observations, patient history, and epidemiological information. The expected result is Negative.  Fact Sheet for Patients:  EntrepreneurPulse.com.au  Fact Sheet for Healthcare Providers:  IncredibleEmployment.be  This test is no t yet approved or cleared by the [place] FDA and  has been authorized for detection and/or diagnosis of SARS-CoV-2 by FDA under an Emergency Use Authorization (EUA). This EUA will remain  in effect (meaning this test can be used) for the duration of the COVID-19 declaration under Section 564(b)(1) of the Act, 21 U.S.C.section 360bbb-3(b)(1), unless the authorization is terminated  or revoked sooner.       Influenza A by PCR NEGATIVE NEGATIVE Final   Influenza B by PCR NEGATIVE NEGATIVE Final    Comment: (NOTE) The Xpert Xpress SARS-CoV-2/FLU/RSV plus assay is intended as an aid in the diagnosis of influenza from Nasopharyngeal swab specimens and should not be used as a sole basis for treatment. Nasal washings and aspirates are unacceptable for Xpert Xpress SARS-CoV-2/FLU/RSV testing.  Fact Sheet for Patients: EntrepreneurPulse.com.au  Fact Sheet for Healthcare Providers: IncredibleEmployment.be  This test is not yet approved or cleared by the [place] FDA and has been authorized for detection and/or diagnosis of SARS-CoV-2 by FDA under an Emergency Use Authorization (EUA). This EUA will remain in effect (meaning this test can be used) for the duration of the COVID-19 declaration under Section 564(b)(1) of the  Act, 21 U.S.C. section 360bbb-3(b)(1), unless the authorization is terminated or revoked.  Performed at [facility], [place] [address], [place], [place] [address]   Urine Culture     Status: Abnormal   Collection Time: 02/20/21 11:52 AM   Specimen: Urine, Clean Catch  Result Value Ref Range Status   Specimen Description URINE, CLEAN CATCH  Final   Special Requests   Final    NONE Performed at [facility], [place] [address] [place], [place] [address]    Culture MULTIPLE SPECIES PRESENT, SUGGEST RECOLLECTION (A)  Final   Report Status 02/21/2021 FINAL  Final  MRSA Next Gen by PCR, Nasal     Status: None   Collection Time: 02/20/21  2:24 PM   Specimen: Nasal Mucosa; Nasal Swab  Result Value Ref Range Status   MRSA by PCR Next Gen NOT DETECTED NOT DETECTED Final    Comment: (NOTE) The GeneXpert MRSA Assay (FDA approved for NASAL specimens only), is one component of a comprehensive MRSA colonization surveillance program. It is not intended to diagnose MRSA infection nor to guide or monitor treatment for MRSA infections. Test performance is not FDA approved in patients less than 50 years old. Performed at [facility], [place] [address] [place], [place] [address]   Culture, blood (routine x 2)     Status: None   Collection Time: 02/20/21  2:53 PM   Specimen: BLOOD LEFT HAND  Result Value Ref Range Status   Specimen Description BLOOD LEFT HAND  Final   Special Requests   Final    BOTTLES DRAWN AEROBIC ONLY Blood Culture results may not be optimal due to an inadequate volume of blood received in culture bottles   Culture   Final    NO GROWTH 5 DAYS Performed at [facility]  Lab, [address] [place], [place] [address]    Report Status 02/25/2021 FINAL  Final  Culture, blood (routine x 2)     Status: None   Collection Time: 02/20/21  3:03 PM   Specimen: BLOOD RIGHT ARM  Result Value Ref Range Status   Specimen Description BLOOD RIGHT ARM  Final   Special  Requests   Final    BOTTLES DRAWN AEROBIC AND ANAEROBIC Blood Culture adequate volume   Culture   Final    NO GROWTH 5 DAYS Performed at [facility], [place] [address] [place] [address] [place], [place] [address]    Report Status 02/25/2021 FINAL  Final         Radiology Studies: DG Chest Port 1 View  Result Date: 02/28/2021 CLINICAL DATA:  Adult respiratory distress syndrome EXAM: PORTABLE CHEST 1 VIEW COMPARISON:  February 25, 2021 FINDINGS: The distal tip of the ET tube is partially obscured by the feeding tube. However, the ETT appears to be in good position. The NG and feeding tubes terminate below today's film. The cardiomediastinal silhouette is stable. No pneumothorax. Bilateral pulmonary infiltrates remain, similar in the interval. No other acute abnormalities or changes. IMPRESSION: 1. Support apparatus as above. 2. Bilateral pulmonary infiltrates remain, not significantly changed. 3. No other interval changes. Electronically Signed   By: Dorise Bullion III M.D.   On: 02/28/2021 12:58        Scheduled Meds:  amLODipine  5 mg Oral Daily   ARIPiprazole  15 mg Per Tube Daily   busPIRone  10 mg Per Tube TID   Chlorhexidine Gluconate Cloth  6 each Topical [id number]   folic acid  1 mg Per Tube Daily   heparin injection (subcutaneous)  5,000 Units Subcutaneous Q8H   lamoTRIgine  25 mg Per Tube BID   mouth rinse  15 mL Mouth Rinse BID   multivitamin with minerals  1 tablet Per Tube Daily   polyethylene glycol  17 g Per Tube Daily   rosuvastatin  10 mg Per Tube Daily   sodium chloride  1 g Per Tube TID   thiamine  100 mg Per Tube Daily   Continuous Infusions:  sodium chloride Stopped (02/22/21 1959)     LOS: 9 days        Hosie Poisson, MD [facility]   To contact the attending provider between 7A-7P or the covering provider during after hours 7P-7A, please log into the web site [email or web address] and access using universal [place] password for that web site. If you do not  have the password, please call the hospital operator.  03/01/2021, 3:41 PM

## 2021-03-01 NOTE — Progress Notes (Signed)
Inadvertently acknowledged new orders during chart review for potential pt transfer. Charge nurse, Loma Sousa, made aware. Pt's primary nurse, Kieth Brightly, made aware and aware of new orders.

## 2021-03-01 NOTE — Progress Notes (Signed)
eLink Physician-Brief Progress Note Patient Name: [name] DOB: 29-Apr-1981 MRN: [id number]   Date of Service  03/01/2021  HPI/Events of Note  Bp rising per nurse.  Patient on norvasc as outpatient  eICU Interventions  Restart norvasc 5 mg daily      Intervention Category Intermediate Interventions: Hypertension - evaluation and management  Mauri Brooklyn, P 03/01/2021, 4:45 AM

## 2021-03-01 NOTE — Plan of Care (Signed)

## 2021-03-01 NOTE — Progress Notes (Signed)
Physical Therapy Treatment Patient Details Name: [name] MRN: [id number] DOB: 1981/01/13 Today's Date: 03/01/2021    History of Present Illness 40 year old person presented to ED 02/20/21 seeking help after drinking 2 bottles of hand sanitizer. Intubated 02/21/21-02/24/21. Post-extubation placed on HHFNC+NRB. CXR with concern for colonic ileus. Cortrak placed 8/10. Patient with respiratory distress on the evening of 8/10 s/p intubation. Ileus 8/11, extubated 8/13; PMH-bipolar depression, breast cancer status post bilateral mastectomy, anxiety, depression, schizophrenia, and recent overdose suicide attempt.    PT Comments    Resuming PT with reorder after pt with respiratory distress and need for reintubation 8/11, with extubation 8/13; Pt participating well, today, required min assist to sit up, stand up, and take a few steps OOB to recliner; Session was punctuated by coughing spells, each with increases in HR and decreases in O2 sats; Initiated activity on 6L supplemental O2, had to incr O2 to 8L with activity due to decreasing O2 sats; Assisted pt with hygeine after he had a moderate loose stool sitting up; Ended session in recliner, back on 6L;   BP sitting EOB 158/95; HR max observed 139; O2 sats decr to 83% with activity on 6 L; incr to 8L during activity, ended session back on 6L, sitting in recliner, O2 sat 94%   Follow Up Recommendations  Other (comment) (May consider PT follow up, depending on progress and mobility status at DC)     Equipment Recommendations  Other (comment) (to be determined; may benefit from using a cane)    Recommendations for Other Services  Ordered OT per protocol     Precautions / Restrictions Precautions Precautions: Other (comment) Precaution Comments: watch sats    Mobility  Bed Mobility Overal bed mobility: Needs Assistance Bed Mobility: Supine to Sit     Supine to sit: Min assist     General bed mobility comments: Min handheld assist to pull  to sit; cues to square off hips at EOB    Transfers Overall transfer level: Needs assistance Equipment used: 1 person hand held assist Transfers: Sit to/from [employer] Transfers Sit to Stand: Min assist Stand pivot transfers: Min assist       General transfer comment: Min assist to steady during transitions; stood for 3-5 minutes near EOB while staff assisted with hygeine (had large soft stool)  Ambulation/Gait Ambulation/Gait assistance: Min assist Gait Distance (Feet):  (pivotal steps bed to chair) Assistive device: 1 person hand held assist Gait Pattern/deviations: Shuffle     General Gait Details: Pivot steps bed to chair; fatigue noted   Stairs             Wheelchair Mobility    Modified Rankin (Stroke Patients Only)       Balance Overall balance assessment: Mild deficits observed, not formally tested (liekly related to fatigue)                                          Cognition Arousal/Alertness: Awake/alert Behavior During Therapy: WFL for tasks assessed/performed Overall Cognitive Status: Within Functional Limits for tasks assessed                                        Exercises      General Comments General comments (skin integrity, edema, etc.): BP sitting EOB 158/95; HR max observed  139; O2 sats decr to 83% with activity on 6 L; incr to 8L during activity, ended session back on 6L, sitting in recliner, O2 sat 94%;      Pertinent Vitals/Pain Pain Assessment: 0-10 Pain Score: 5  Pain Location: chest pain and tightness with coughing Pain Descriptors / Indicators: Tightness Pain Intervention(s): Monitored during session    Home Living                      Prior Function            PT Goals (current goals can now be found in the care plan section) Acute Rehab PT Goals Patient Stated Goal: get better; be able to walk PT Goal Formulation: With patient Time For Goal Achievement:  03/15/21 Potential to Achieve Goals: Good Progress towards PT goals: Progressing toward goals (Reorder for PT recieved after pt on mech ventilation for approx 2 days; Origianl goals remain overall appropriate)    Frequency    Min 3X/week      PT Plan Current plan remains appropriate    Co-evaluation              AM-PAC PT "6 Clicks" Mobility   Outcome Measure  Help needed turning from your back to your side while in a flat bed without using bedrails?: None Help needed moving from lying on your back to sitting on the side of a flat bed without using bedrails?: A Little Help needed moving to and from a bed to a chair (including a wheelchair)?: A Little Help needed standing up from a chair using your arms (e.g., wheelchair or bedside chair)?: A Little Help needed to walk in hospital room?: A Little Help needed climbing 3-5 steps with a railing? : A Lot 6 Click Score: 18    End of Session Equipment Utilized During Treatment: Oxygen Activity Tolerance: Patient tolerated treatment well;Patient limited by fatigue Patient left: in chair;with call bell/phone within reach;with nursing/sitter in room Nurse Communication: Mobility status;Other (comment) (check sacral dressing for stool) PT Visit Diagnosis: Difficulty in walking, not elsewhere classified (R26.2)     Time: [id number] PT Time Calculation (min) (ACUTE ONLY): 32 min  Charges:  $Therapeutic Activity: 23-37 mins                     Roney Marion, PT  Acute Rehabilitation Services Pager [phone] Office [place] 03/01/2021, 10:40 AM

## 2021-03-01 NOTE — Progress Notes (Signed)
VASCULAR LAB    Right upper extremity venous duplex has been performed.  See CV proc for preliminary results.   Aayushi Solorzano, RVT 03/01/2021, 1:53 PM

## 2021-03-02 DIAGNOSIS — T490X1A Poisoning by local antifungal, anti-infective and anti-inflammatory drugs, accidental (unintentional), initial encounter: Secondary | ICD-10-CM

## 2021-03-02 DIAGNOSIS — F333 Major depressive disorder, recurrent, severe with psychotic symptoms: Secondary | ICD-10-CM

## 2021-03-02 LAB — GLUCOSE, CAPILLARY
Glucose-Capillary: 102 mg/dL — ABNORMAL HIGH (ref 70–99)
Glucose-Capillary: 105 mg/dL — ABNORMAL HIGH (ref 70–99)
Glucose-Capillary: 117 mg/dL — ABNORMAL HIGH (ref 70–99)
Glucose-Capillary: 127 mg/dL — ABNORMAL HIGH (ref 70–99)

## 2021-03-02 MED ORDER — ARIPIPRAZOLE 5 MG PO TABS
15.0000 mg | ORAL_TABLET | Freq: Every day | ORAL | Status: DC
  Administered 2021-03-02 – 2021-03-04 (×3): 15 mg via ORAL
  Filled 2021-03-02 (×3): qty 1

## 2021-03-02 MED ORDER — LIP MEDEX EX OINT
TOPICAL_OINTMENT | CUTANEOUS | Status: DC | PRN
  Filled 2021-03-02: qty 7

## 2021-03-02 MED ORDER — ENSURE ENLIVE PO LIQD
237.0000 mL | Freq: Three times a day (TID) | ORAL | Status: DC
  Administered 2021-03-02 – 2021-03-04 (×4): 237 mL via ORAL

## 2021-03-02 MED ORDER — POLYETHYLENE GLYCOL 3350 17 G PO PACK: 17.0000 g | PACK | Freq: Every day | ORAL | Status: DC | PRN

## 2021-03-02 MED ORDER — SODIUM CHLORIDE 1 G PO TABS
1.0000 g | ORAL_TABLET | Freq: Three times a day (TID) | ORAL | Status: DC
  Administered 2021-03-02 – 2021-03-04 (×7): 1 g via ORAL
  Filled 2021-03-02 (×9): qty 1

## 2021-03-02 MED ORDER — POLYETHYLENE GLYCOL 3350 17 G PO PACK: 17.0000 g | PACK | Freq: Every day | ORAL | Status: DC

## 2021-03-02 MED ORDER — MENTHOL 3 MG MT LOZG
1.0000 | LOZENGE | OROMUCOSAL | Status: DC | PRN
  Administered 2021-03-02: 3 mg via ORAL
  Filled 2021-03-02: qty 9

## 2021-03-02 MED ORDER — DOCUSATE SODIUM 100 MG PO CAPS: 100.0000 mg | ORAL_CAPSULE | Freq: Every day | ORAL | Status: DC | PRN

## 2021-03-02 MED ORDER — THIAMINE HCL 100 MG PO TABS
100.0000 mg | ORAL_TABLET | Freq: Every day | ORAL | Status: DC
  Administered 2021-03-02 – 2021-03-04 (×3): 100 mg via ORAL
  Filled 2021-03-02 (×3): qty 1

## 2021-03-02 MED ORDER — TRAZODONE HCL 50 MG PO TABS
100.0000 mg | ORAL_TABLET | Freq: Every evening | ORAL | Status: DC | PRN
  Administered 2021-03-02: 100 mg via ORAL
  Filled 2021-03-02: qty 2

## 2021-03-02 MED ORDER — SIMETHICONE 80 MG PO CHEW: 160.0000 mg | CHEWABLE_TABLET | Freq: Four times a day (QID) | ORAL | Status: DC | PRN

## 2021-03-02 MED ORDER — ACETAMINOPHEN 325 MG PO TABS: 650.0000 mg | ORAL_TABLET | Freq: Four times a day (QID) | ORAL | Status: DC | PRN

## 2021-03-02 MED ORDER — BUSPIRONE HCL 5 MG PO TABS
10.0000 mg | ORAL_TABLET | Freq: Three times a day (TID) | ORAL | Status: DC
  Administered 2021-03-02 – 2021-03-04 (×7): 10 mg via ORAL
  Filled 2021-03-02 (×7): qty 2

## 2021-03-02 MED ORDER — PHENOL 1.4 % MT LIQD
1.0000 | OROMUCOSAL | Status: DC | PRN
  Administered 2021-03-03: 1 via OROMUCOSAL
  Filled 2021-03-02: qty 177

## 2021-03-02 MED ORDER — HYDROXYZINE HCL 25 MG PO TABS: 50.0000 mg | ORAL_TABLET | Freq: Four times a day (QID) | ORAL | Status: DC | PRN

## 2021-03-02 MED ORDER — FOLIC ACID 1 MG PO TABS
1.0000 mg | ORAL_TABLET | Freq: Every day | ORAL | Status: DC
  Administered 2021-03-02 – 2021-03-04 (×3): 1 mg via ORAL
  Filled 2021-03-02 (×3): qty 1

## 2021-03-02 MED ORDER — LAMOTRIGINE 25 MG PO TABS
25.0000 mg | ORAL_TABLET | Freq: Two times a day (BID) | ORAL | Status: DC
  Administered 2021-03-02 – 2021-03-04 (×5): 25 mg via ORAL
  Filled 2021-03-02 (×6): qty 1

## 2021-03-02 MED ORDER — ADULT MULTIVITAMIN W/MINERALS CH
1.0000 | ORAL_TABLET | Freq: Every day | ORAL | Status: DC
  Administered 2021-03-02 – 2021-03-04 (×3): 1 via ORAL
  Filled 2021-03-02 (×3): qty 1

## 2021-03-02 MED ORDER — ROSUVASTATIN CALCIUM 20 MG PO TABS
10.0000 mg | ORAL_TABLET | Freq: Every day | ORAL | Status: DC
  Administered 2021-03-02 – 2021-03-04 (×3): 10 mg via ORAL
  Filled 2021-03-02 (×3): qty 1

## 2021-03-02 NOTE — Plan of Care (Signed)

## 2021-03-02 NOTE — Progress Notes (Signed)
Nutrition Follow-up  DOCUMENTATION CODES:   Non-severe (moderate) malnutrition in context of chronic illness  INTERVENTION:   -Ensure Enlive po TID, each supplement provides 350 kcal and 20 grams of protein  -MVI with minerals daily -Magic cup TID with meals, each supplement provides 290 kcal and 9 grams of protein   NUTRITION DIAGNOSIS:   Moderate Malnutrition related to chronic illness (schizophrenia, bipolar 1 disorder, depression, polysubstance abuse) as evidenced by mild fat depletion, moderate muscle depletion.  Ongoing  GOAL:   Patient will meet greater than or equal to 90% of their needs  Progressing   MONITOR:   PO intake, Supplement acceptance, Labs, Weight trends, Skin, I & O's  REASON FOR ASSESSMENT:   Ventilator, Consult Enteral/tube feeding initiation and management  ASSESSMENT:   40 year old male who presented to the ED on 8/04 with N/V and suicidal attempt after having consumed hand sanitizer. PMH of anxiety, depression, bipolar 1 disorder, schizophrenia, EtOH abuse, polysubstance abuse, renal cancer s/p L kidney partial nephrectomy in 2014, HTN, breast cancer. Pt admitted with acute renal failure, acute hypoxic respiratory failure.  8/06 - intubated, CRRT initiated 8/08 - CRRT stopped 8/09 - extubated 8/10 - diet advanced to clear liquids, Cortrak placed (tip gastric), intubated, TF held due to concern for colonic ileus, OGT to suction 8/12- post-pyloric cortrak tube placed 8/13- extubated, advanced to a regular diet 8/14- cortrak removed  Reviewed I/O's: -120 ml x 24 hours and -5 L since admission  UOP: 775 ml x 24 hours   Spoke with pt at bedside, who reports feeling a little better today. He reports his appetite has improved, but complains of a sore throat secondary to intubation and cortrak tube, which has improved since tube removal. He shares that he still feels very queasy, especially with solid food. He consumed some broth for breakfast and is  looking forward to eating chicken noodle soup for lunch. Pt shares that he is drinking much more than he is eating and tolerates the Boost Breeze supplements well. RD reviewed supplements on the formulary and pt would like to try Ensure supplements due to increased nutrition density. RD discussed importance of good meal and supplement intake to promote healing. Pt very appreciative of visit.   Medications reviewed and include folic acid and thiamine.   Labs reviewed: CBGS: 102-139.   Diet Order:   Diet Order             Diet regular Room service appropriate? Yes; Fluid consistency: Thin  Diet effective now                   EDUCATION NEEDS:   Education needs have been addressed  Skin:  Skin Assessment: Reviewed RN Assessment  Last BM:  03/02/21  Height:   Ht Readings from Last 1 Encounters:  02/19/21 '5\' 6"'$  (1.676 m)    Weight:   Wt Readings from Last 1 Encounters:  03/02/21 68.2 kg   BMI:  Body mass index is 24.27 kg/m.  Estimated Nutritional Needs:   Kcal:  2050-2250  Protein:  110-130 grams  Fluid:  >/= 2.0 L    Loistine Chance, RD, LDN, [place] Registered Dietitian II Certified Diabetes Care and Education Specialist Please refer to [facility] for RD and/or RD on-call/weekend/after hours pager

## 2021-03-02 NOTE — Care Plan (Signed)
IMTS to assume care 03/03/21. Provider handoff given by Dr. Starla Link.  Please continue to contact the [facility] service until 8/16 at 0700.

## 2021-03-02 NOTE — Progress Notes (Signed)
Patient ID: [name], male   DOB: June 28, 1981, 40 y.o.   MRN: [id number]  PROGRESS NOTE    [name]  [id number] DOB: 03-02-1981 DOA: 02/19/2021 PCP: Maudie Mercury, MD   Brief Narrative:  40 year old male with history of bipolar disorder, depression with suicide attempt, hyperlipidemia, hypertension, cirrhosis, pancreatitis, alcohol abuse, breast cancer presented with intentional overdose with hand sanitizer.  On presentation, she was admitted to [facility] service.  He was intubated and started on CRRT.  Nephrology was consulted.  UDS was positive for cocaine and benzodiazepine.  He was extubated on 02/24/2021 but reintubated on 02/25/2021 after continued respiratory distress.  He completed 5-day course of Levaquin for pneumonia.  He was extubated again on 02/28/2021.  He was transferred to [facility] service on 03/01/2021.    Assessment & Plan:   Acute hypoxic and hypercapnic respiratory failure with ARDS with aspiration pneumonitis -Was intubated and extubated then subsequently reintubated and finally extubated again on 02/28/2021. -Respiratory status improving.  Currently on 4 L oxygen via nasal cannula.  Wean off as able.  Incentive spirometry.  Chest x-ray on 03/01/2021 showed bilateral pulmonary infiltrates.  PCCM has signed off.  Has completed 5-day course of Levaquin.  Intentional overdose with isopropyl alcohol in the setting of bipolar disorder/depression/schizophrenia/polysubstance abuse Acute toxic encephalopathy: Mental status much improved -UDS positive for cocaine and benzodiazepines.  Continue one-to-one sitter.  Psych recommended inpatient psychiatry admission -Continue BuSpar, Klonopin, Abilify and Lamictal -Initially required CRRT which has been discontinued.  Nephrology has signed off.  History of renal cell carcinoma status post partial nephrectomy -Outpatient follow-up with urology.  Monitor urine output  Leukocytosis -Questionable cause.   Monitor  Hyponatremia -Resolved  Right upper extremity swelling and tenderness at the IV site line -Duplex ultrasound was negative for DVT  AKI -Resolved  Dyslipidemia -continue statin  Hypertension -blood pressure stable.  Continue amlodipine   DVT prophylaxis: Heparin subcutaneous Code Status: Full Family Communication: None at bedside Disposition Plan: Status is: Inpatient  Remains inpatient appropriate because:Inpatient level of care appropriate due to severity of illness  Dispo: The patient is from: Home              Anticipated d/c is to: Inpatient psychiatric facility once medically stable              Patient currently is not medically stable to d/c.   Difficult to place patient No  Consultants: Psychiatry/PCCM/nephrology  Procedures: Intubation and extubation/CRRT  Antimicrobials:  Anti-infectives (From admission, onward)    Start     Dose/Rate Route Frequency Ordered Stop   02/26/21 1500  levofloxacin (LEVAQUIN) IVPB 500 mg  Status:  Discontinued        500 mg 100 mL/hr over 60 Minutes Intravenous  Once 02/25/21 1553 02/26/21 0933   02/25/21 1500  levofloxacin (LEVAQUIN) tablet 500 mg  Status:  Discontinued        500 mg Oral Daily 02/25/21 1056 02/25/21 1553   02/25/21 1200  levofloxacin (LEVAQUIN) tablet 500 mg  Status:  Discontinued        500 mg Oral Daily 02/25/21 1007 02/25/21 1056   02/22/21 1200  levofloxacin (LEVAQUIN) IVPB 500 mg  Status:  Discontinued        500 mg 100 mL/hr over 60 Minutes Intravenous Every 24 hours 02/21/21 1116 02/25/21 1007   02/21/21 0715  levofloxacin (LEVAQUIN) IVPB 500 mg  Status:  Discontinued        500 mg 100 mL/hr over 60 Minutes Intravenous Every 48 hours  02/21/21 0657 02/21/21 1116        Subjective: Patient seen and examined at bedside.  Denies worsening shortness of breath or chest pain.  No overnight fever or vomiting reported.  Objective: Vitals:   03/02/21 0700 03/02/21 0800 03/02/21 0900 03/02/21  1200  BP: 124/83 131/89  116/77  Pulse: (!) 110 98  (!) 101  Resp: (!) 27 (!) 41  (!) 41  Temp:   99.2 F (37.3 C)   TempSrc:   Oral   SpO2: 100% 100%  100%  Weight:      Height:        Intake/Output Summary (Last 24 hours) at 03/02/2021 1338 Last data filed at 03/02/2021 0830 Gross per 24 hour  Intake 655 ml  Output 775 ml  Net -120 ml   Filed Weights   02/28/21 0500 03/01/21 0405 03/02/21 0500  Weight: 68 kg 69 kg 68.2 kg    Examination:  General exam: Appears calm and comfortable.  Currently on 4 L oxygen via nasal cannula Respiratory system: Bilateral decreased breath sounds at bases with scattered crackles Cardiovascular system: S1 & S2 heard, intermittently tachycardic Gastrointestinal system: Abdomen is nondistended, soft and nontender. Normal bowel sounds heard. Extremities: No cyanosis, clubbing; trace lower extremity edema Central nervous system: Alert and oriented. No focal neurological deficits. Moving extremities Skin: No rashes, lesions or ulcers Psychiatry: Flat affect; intermittently looks anxious.   Data Reviewed: I have personally reviewed following labs and imaging studies  CBC: Recent Labs  Lab 02/25/21 1640 02/28/21 0110 03/01/21 0558  WBC  --  14.6* 19.5*  HGB 14.6 12.2* 13.8  HCT 43.0 35.4* 41.6  MCV  --  86.1 86.0  PLT  --  210 PLATELET CLUMPS NOTED ON SMEAR, UNABLE TO ESTIMATE   Basic Metabolic Panel: Recent Labs  Lab 02/25/21 0301 02/25/21 1640 02/25/21 1815 02/26/21 0500 02/26/21 1754 02/27/21 0323 02/28/21 0110 03/01/21 1416  NA 132* 131*  --  130*  --  132* 136 135  K 4.2 3.8  --  4.1  --  3.5 4.0 3.7  CL 93*  --   --  91*  --  97* 103 100  CO2 29  --   --  28  --  '26 22 28  '$ GLUCOSE 107*  --   --  96  --  108* 108* 135*  BUN 5*  --   --  18  --  25* 32* 12  CREATININE 0.69  --   --  1.14  --  1.32* 1.13 0.79  CALCIUM 8.7*  --   --  8.3*  --  8.5* 8.7* 8.4*  MG 1.5*  --  2.0 2.1 2.1 2.1 1.9  --   PHOS 2.6  --  3.3 2.6   2.5 3.1 3.8 3.9  --    GFR: Estimated Creatinine Clearance: 109.7 mL/min (by C-G formula based on SCr of 0.79 mg/dL). Liver Function Tests: Recent Labs  Lab 02/24/21 0543 02/25/21 0301 02/26/21 0500 02/27/21 0323  ALBUMIN 1.3* 2.7* 2.1* 2.1*   No results for input(s): LIPASE, AMYLASE in the last 168 hours. No results for input(s): AMMONIA in the last 168 hours. Coagulation Profile: No results for input(s): INR, PROTIME in the last 168 hours. Cardiac Enzymes: No results for input(s): CKTOTAL, CKMB, CKMBINDEX, TROPONINI in the last 168 hours. BNP (last 3 results) No results for input(s): PROBNP in the last 8760 hours. HbA1C: No results for input(s): HGBA1C in the last 72 hours. CBG: Recent Labs  Lab 03/01/21 1517 03/01/21 2025 03/02/21 0006 03/02/21 0349 03/02/21 0735  GLUCAP 143* 139* 117* 105* 102*   Lipid Profile: No results for input(s): CHOL, HDL, LDLCALC, TRIG, CHOLHDL, LDLDIRECT in the last 72 hours. Thyroid Function Tests: No results for input(s): TSH, T4TOTAL, FREET4, T3FREE, THYROIDAB in the last 72 hours. Anemia Panel: No results for input(s): VITAMINB12, FOLATE, FERRITIN, TIBC, IRON, RETICCTPCT in the last 72 hours. Sepsis Labs: No results for input(s): PROCALCITON, LATICACIDVEN in the last 168 hours.  Recent Results (from the past 240 hour(s))  MRSA Next Gen by PCR, Nasal     Status: None   Collection Time: 02/20/21  2:24 PM   Specimen: Nasal Mucosa; Nasal Swab  Result Value Ref Range Status   MRSA by PCR Next Gen NOT DETECTED NOT DETECTED Final    Comment: (NOTE) The GeneXpert MRSA Assay (FDA approved for NASAL specimens only), is one component of a comprehensive MRSA colonization surveillance program. It is not intended to diagnose MRSA infection nor to guide or monitor treatment for MRSA infections. Test performance is not FDA approved in patients less than 45 years old. Performed at [facility], [place] [address] [place], [place] [address]    Culture, blood (routine x 2)     Status: None   Collection Time: 02/20/21  2:53 PM   Specimen: BLOOD LEFT HAND  Result Value Ref Range Status   Specimen Description BLOOD LEFT HAND  Final   Special Requests   Final    BOTTLES DRAWN AEROBIC ONLY Blood Culture results may not be optimal due to an inadequate volume of blood received in culture bottles   Culture   Final    NO GROWTH 5 DAYS Performed at [facility], [place] [address] [place], [place] [address]    Report Status 02/25/2021 FINAL  Final  Culture, blood (routine x 2)     Status: None   Collection Time: 02/20/21  3:03 PM   Specimen: BLOOD RIGHT ARM  Result Value Ref Range Status   Specimen Description BLOOD RIGHT ARM  Final   Special Requests   Final    BOTTLES DRAWN AEROBIC AND ANAEROBIC Blood Culture adequate volume   Culture   Final    NO GROWTH 5 DAYS Performed at [facility], [place] [address], [place], [place] [address]    Report Status 02/25/2021 FINAL  Final         Radiology Studies: DG CHEST PORT 1 VIEW  Result Date: 03/01/2021 CLINICAL DATA:  Follow-up. EXAM: PORTABLE CHEST 1 VIEW COMPARISON:  February 28, 2021 FINDINGS: Bilateral pulmonary infiltrates, left greater than right, stable on the right and stable to mildly worsened on the left. No other interval changes. IMPRESSION: Bilateral pulmonary infiltrates persist. The mild infiltrate in the right base is stable. The more marked infiltrate in the left base is stable to mildly more prominent the interval. Electronically Signed   By: Dorise Bullion III M.D.   On: 03/01/2021 18:20   VAS [place] UPPER EXTREMITY VENOUS DUPLEX  Result Date: 03/01/2021 UPPER VENOUS STUDY  Patient Name:  MARTAVIUS EGUCHI  Date of Exam:   03/01/2021 Medical Rec #: [id number]    Accession #:    [id number] Date of Birth: 09/27/1980   Patient Gender: M Patient Age:   46 years Exam Location:  [facility] Procedure:      VAS [place] UPPER EXTREMITY VENOUS DUPLEX Referring Phys: Hosie Poisson --------------------------------------------------------------------------------  Indications: Pain, Swelling, and IV in  arm for 8 days Limitations: Line and bandages. Comparison Study: No prior study on file Performing Technologist: Sharion Dove RVS  Examination Guidelines: A complete evaluation includes B-mode imaging, spectral Doppler, color Doppler, and power Doppler as needed of all accessible portions of each vessel. Bilateral testing is considered an integral part of a complete examination. Limited examinations for reoccurring indications may be performed as noted.  Right Findings: +----------+------------+---------+-----------+----------+---------------------+ RIGHT     CompressiblePhasicitySpontaneousProperties       Summary        +----------+------------+---------+-----------+----------+---------------------+ IJV                      Yes       Yes                                    +----------+------------+---------+-----------+----------+---------------------+ Subclavian               Yes       Yes                                    +----------+------------+---------+-----------+----------+---------------------+ Axillary                 Yes       Yes                                    +----------+------------+---------+-----------+----------+---------------------+ Brachial                 Yes       Yes                                    +----------+------------+---------+-----------+----------+---------------------+ Radial        Full                                                        +----------+------------+---------+-----------+----------+---------------------+ Ulnar                                                  Not visualized     +----------+------------+---------+-----------+----------+---------------------+ Cephalic      Full                                                         +----------+------------+---------+-----------+----------+---------------------+ Basilic                                              patent by color and  Doppler        +----------+------------+---------+-----------+----------+---------------------+  Left Findings: +----------+------------+---------+-----------+----------+-------+ LEFT      CompressiblePhasicitySpontaneousPropertiesSummary +----------+------------+---------+-----------+----------+-------+ Subclavian               Yes       Yes                      +----------+------------+---------+-----------+----------+-------+  Summary:  Right: No evidence of deep vein thrombosis in the upper extremity. No evidence of superficial vein thrombosis in the upper extremity.  Left: No evidence of thrombosis in the subclavian.  *See table(s) above for measurements and observations.     Preliminary         Scheduled Meds:  amLODipine  5 mg Oral Daily   ARIPiprazole  15 mg Oral Daily   busPIRone  10 mg Oral TID   Chlorhexidine Gluconate Cloth  6 each Topical Q2000   feeding supplement  237 mL Oral TID BM   folic acid  1 mg Oral Daily   heparin injection (subcutaneous)  5,000 Units Subcutaneous Q8H   lamoTRIgine  25 mg Oral BID   mouth rinse  15 mL Mouth Rinse BID   multivitamin with minerals  1 tablet Oral Daily   rosuvastatin  10 mg Oral Daily   sodium chloride  1 g Oral TID   thiamine  100 mg Oral Daily   Continuous Infusions:  sodium chloride Stopped (02/22/21 1959)          Aline August, MD [facility] 03/02/2021, 1:38 PM
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
## 2021-03-02 NOTE — Progress Notes (Signed)
Occupational Therapy Treatment Patient Details Name: [name] MRN: [id number] DOB: [birth year] Today's Date: 03/02/2021    History of present illness 40 year old person presented to ED 02/20/21 seeking help after drinking 2 bottles of hand sanitizer. Intubated 02/21/21-02/24/21. Post-extubation placed on HHFNC+NRB. CXR with concern for colonic ileus. Cortrak placed 8/10. Patient with respiratory distress on the evening of 8/10 s/p intubation. Ileus 8/11, extubated 8/13; PMH-bipolar depression, breast cancer status post bilateral mastectomy, anxiety, depression, schizophrenia, and recent overdose suicide attempt.   OT comments  OT treatment session with focus on therapeutic exercise, higher level balance and self-care re-education. Patient completes bed mobility with Mod I, sit to stand and higher level balance activities with Min guard, UB bathing/dressing with set-up assist and LB bathing/dressing with Min guard. SpO2 >94% on 4L throughout. HR 124 max. Patient would benefit from continued acute OT services in prep for safe d/c. Patient likely to continue to progress for safe d/c to inpatient behavioral health.    Follow Up Recommendations  No OT follow up;Other (comment) (Behavioral Health)    Equipment Recommendations  None recommended by OT    Recommendations for Other Services      Precautions / Restrictions Precautions Precautions: Other (comment) Precaution Comments: Monitor vitals; 1:1 sitter Restrictions Weight Bearing Restrictions: No       Mobility Bed Mobility Overal bed mobility: Modified Independent                  Transfers Overall transfer level: Needs assistance Equipment used: 1 person hand held assist Transfers: Sit to/from Stand;Stand Pivot Transfers Sit to Stand: Min guard Stand pivot transfers: Min guard       General transfer comment: Min guard for sit to stand from EOB x5 reps. Min guard for stand-pivot to recliner.    Balance Overall balance  assessment: Needs assistance Sitting-balance support: No upper extremity supported;Feet supported Sitting balance-Leahy Scale: Normal     Standing balance support: Single extremity supported;During functional activity Standing balance-Leahy Scale: Fair Standing balance comment: Reliant on unilateral UE support with dynamic balance                           ADL either performed or assessed with clinical judgement   ADL Overall ADL's : Needs assistance/impaired         Upper Body Bathing: Set up;Sitting Upper Body Bathing Details (indicate cue type and reason): Seated EOB. Lower Body Bathing: Min guard;Sit to/from stand Lower Body Bathing Details (indicate cue type and reason): Able to wash BLE seated EOB without external assist and Min guard to wash front perineal and buttocks in standing. Upper Body Dressing : Set up Upper Body Dressing Details (indicate cue type and reason): Donned anterior hospital gown seated EOB. Lower Body Dressing: Min guard Lower Body Dressing Details (indicate cue type and reason): Donned footwear seated EOB with set-up assist. Toilet Transfer: Min guard Toilet Transfer Details (indicate cue type and reason): Simulated with transfer to recliner.         Functional mobility during ADLs: Min guard       Vision       Perception     Praxis      Cognition Arousal/Alertness: Awake/alert Behavior During Therapy: WFL for tasks assessed/performed Overall Cognitive Status: Within Functional Limits for tasks assessed  Exercises     Shoulder Instructions       General Comments SpO2 >93% on 4L via [place] with balance activities and ADLs. HR 124 Max.    Pertinent Vitals/ Pain       Pain Assessment: Faces Faces Pain Scale: Hurts a little bit Pain Location: generalized with movement Pain Descriptors / Indicators: Sore;Aching  Home Living                                           Prior Functioning/Environment              Frequency  Min 2X/week        Progress Toward Goals  OT Goals(current goals can now be found in the care plan section)  Progress towards OT goals: Progressing toward goals  Acute Rehab OT Goals Patient Stated Goal: get better; be able to walk OT Goal Formulation: With patient Time For Goal Achievement: 03/12/21 Potential to Achieve Goals: Good ADL Goals Pt Will Perform Upper Body Bathing: Independently Pt Will Perform Lower Body Bathing: Independently Pt Will Perform Upper Body Dressing: Independently Pt Will Perform Lower Body Dressing: Independently Pt Will Transfer to Toilet: Independently Pt Will Perform Toileting - Clothing Manipulation and hygiene: Independently Pt Will Perform Tub/Shower Transfer: Shower transfer;ambulating;Independently  Plan Discharge plan remains appropriate;Frequency remains appropriate    Co-evaluation                 AM-PAC OT "6 Clicks" Daily Activity     Outcome Measure   Help from another person eating meals?: None Help from another person taking care of personal grooming?: A Little Help from another person toileting, which includes using toliet, bedpan, or urinal?: A Little Help from another person bathing (including washing, rinsing, drying)?: A Little Help from another person to put on and taking off regular upper body clothing?: A Little Help from another person to put on and taking off regular lower body clothing?: None 6 Click Score: 20    End of Session Equipment Utilized During Treatment: Gait belt;Oxygen (4L via [place])  OT Visit Diagnosis: Muscle weakness (generalized) (M62.81)   Activity Tolerance Patient tolerated treatment well   Patient Left in chair;with call bell/phone within reach;with nursing/sitter in room   Nurse Communication Mobility status;Other (comment) (Response to treatment)        Time: [id number] OT Time Calculation (min): 28  min  Charges: OT General Charges $OT Visit: 1 Visit OT Treatments $Self Care/Home Management : 8-22 mins $Therapeutic Activity: 8-22 mins  Peni Rupard H. OTR/L Supplemental OT, Department of rehab services [phone]   Iolanda Folson R H. 03/02/2021, 9:53 AM

## 2021-03-03 ENCOUNTER — Telehealth (HOSPITAL_COMMUNITY): Payer: Self-pay | Admitting: Pharmacist

## 2021-03-03 LAB — CBC WITH DIFFERENTIAL/PLATELET
Abs Immature Granulocytes: 0.26 10*3/uL — ABNORMAL HIGH (ref 0.00–0.07)
Basophils Absolute: 0.1 10*3/uL (ref 0.0–0.1)
Basophils Relative: 1 %
Eosinophils Absolute: 0.1 10*3/uL (ref 0.0–0.5)
Eosinophils Relative: 1 %
HCT: 36.4 % — ABNORMAL LOW (ref 39.0–52.0)
Hemoglobin: 12 g/dL — ABNORMAL LOW (ref 13.0–17.0)
Immature Granulocytes: 2 %
Lymphocytes Relative: 16 %
Lymphs Abs: 2.1 10*3/uL (ref 0.7–4.0)
MCH: 28.5 pg (ref 26.0–34.0)
MCHC: 33 g/dL (ref 30.0–36.0)
MCV: 86.5 fL (ref 80.0–100.0)
Monocytes Absolute: 0.9 10*3/uL (ref 0.1–1.0)
Monocytes Relative: 7 %
Neutro Abs: 9.6 10*3/uL — ABNORMAL HIGH (ref 1.7–7.7)
Neutrophils Relative %: 73 %
Platelets: 328 10*3/uL (ref 150–400)
RBC: 4.21 MIL/uL — ABNORMAL LOW (ref 4.22–5.81)
RDW: 13.4 % (ref 11.5–15.5)
WBC: 13.1 10*3/uL — ABNORMAL HIGH (ref 4.0–10.5)
nRBC: 0 % (ref 0.0–0.2)

## 2021-03-03 LAB — BASIC METABOLIC PANEL
Anion gap: 10 (ref 5–15)
BUN: 6 mg/dL (ref 6–20)
CO2: 23 mmol/L (ref 22–32)
Calcium: 8.5 mg/dL — ABNORMAL LOW (ref 8.9–10.3)
Chloride: 102 mmol/L (ref 98–111)
Creatinine, Ser: 0.81 mg/dL (ref 0.61–1.24)
GFR, Estimated: >60 mL/min (ref >60)
Glucose, Bld: 112 mg/dL — ABNORMAL HIGH (ref 70–99)
Potassium: 3.6 mmol/L (ref 3.5–5.1)
Sodium: 135 mmol/L (ref 135–145)

## 2021-03-03 LAB — MAGNESIUM: Magnesium: 1.7 mg/dL (ref 1.7–2.4)

## 2021-03-03 MED ORDER — ACETAMINOPHEN 325 MG PO TABS: 650.0000 mg | ORAL_TABLET | Freq: Four times a day (QID) | ORAL | Status: DC | PRN

## 2021-03-03 MED ORDER — RIVAROXABAN 10 MG PO TABS
10.0000 mg | ORAL_TABLET | Freq: Every day | ORAL | Status: DC
  Administered 2021-03-04: 10 mg via ORAL
  Filled 2021-03-03: qty 1

## 2021-03-03 MED ORDER — TRAZODONE HCL 50 MG PO TABS
300.0000 mg | ORAL_TABLET | Freq: Every evening | ORAL | Status: DC | PRN
  Administered 2021-03-03: 300 mg via ORAL
  Filled 2021-03-03: qty 6

## 2021-03-03 NOTE — Progress Notes (Signed)
Physical Therapy Treatment Patient Details Name: [name] MRN: [id number] DOB: March 02, 1981 Today's Date: 03/03/2021    History of Present Illness 40 year old person presented to ED 02/20/21 seeking help after drinking 2 bottles of hand sanitizer. Intubated 02/21/21-02/24/21. Post-extubation placed on HHFNC+NRB. CXR with concern for colonic ileus. Cortrak placed 8/10. Patient with respiratory distress on the evening of 8/10 s/p intubation. Ileus 8/11, extubated 8/13; PMH-bipolar depression, breast cancer status post bilateral mastectomy, anxiety, depression, schizophrenia, and recent overdose suicide attempt.    PT Comments    Pt admitted with above diagnosis. Pt on 4LO2 on arrival with sats 100%.  Removed O2 for activity.  Pt was able to ambulate in room with RW with good stability overall. Pt did desaturate to 85% on RA therefore replaced O2 at 4L and left pt on 4L on departure.  Pt currently with functional limitations due to balance and endurance deficits. Note pt will most likely go to Behavioral Health at d/c and should progress and be able to use a RW while there.  If he can receive HHPT there, would benefit. Will continue to follow pt acutely. Pt will benefit from skilled PT to increase their independence and safety with mobility to allow discharge to the venue listed below.      Follow Up Recommendations  Other (comment);Home health PT (Behavioral Health)     Equipment Recommendations  Rolling walker with 5" wheels    Recommendations for Other Services       Precautions / Restrictions Precautions Precautions: Other (comment) Precaution Comments: Monitor vitals Restrictions Weight Bearing Restrictions: No    Mobility  Bed Mobility   Bed Mobility: Sit to Supine       Sit to supine: Supervision   General bed mobility comments: No assist to lide down    Transfers Overall transfer level: Needs assistance Equipment used: Rolling walker (2 wheeled) Transfers: Sit to/from  [employer] Transfers Sit to Stand: Min guard         General transfer comment: Min guard to stand from recliner  Ambulation/Gait Ambulation/Gait assistance: Min assist;Min guard Gait Distance (Feet): 100 Feet Assistive device: Rolling walker (2 wheeled) Gait Pattern/deviations: Decreased stride length;Step-through pattern   Gait velocity interpretation: <1.31 ft/sec, indicative of household ambulator General Gait Details: Pt walked 4 laps in room with RW wtih overall good stability.  Pt on 4L on arrival at 100%.  Removed O2 but pt did drop to 85% on RA with activity ttherefore replaced 4LO2 during walk wiht sats 88% and greater with 4LO2 in place.  Pt tolerated session well overall.   Stairs             Wheelchair Mobility    Modified Rankin (Stroke Patients Only)       Balance Overall balance assessment: Needs assistance Sitting-balance support: No upper extremity supported;Feet supported Sitting balance-Leahy Scale: Normal     Standing balance support: During functional activity;Bilateral upper extremity supported Standing balance-Leahy Scale: Poor Standing balance comment: Reliant on bilateral UE support with dynamic balance                            Cognition Arousal/Alertness: Awake/alert Behavior During Therapy: WFL for tasks assessed/performed Overall Cognitive Status: Within Functional Limits for tasks assessed  Exercises      General Comments        Pertinent Vitals/Pain Pain Assessment: No/denies pain    Home Living                      Prior Function            PT Goals (current goals can now be found in the care plan section) Acute Rehab PT Goals Patient Stated Goal: get better; be able to walk Progress towards PT goals: Progressing toward goals    Frequency    Min 3X/week      PT Plan Current plan remains appropriate    Co-evaluation               AM-PAC PT "6 Clicks" Mobility   Outcome Measure  Help needed turning from your back to your side while in a flat bed without using bedrails?: None Help needed moving from lying on your back to sitting on the side of a flat bed without using bedrails?: A Little Help needed moving to and from a bed to a chair (including a wheelchair)?: A Little Help needed standing up from a chair using your arms (e.g., wheelchair or bedside chair)?: A Little Help needed to walk in hospital room?: A Little Help needed climbing 3-5 steps with a railing? : A Lot 6 Click Score: 18    End of Session Equipment Utilized During Treatment: Oxygen;Gait belt Activity Tolerance: Patient tolerated treatment well;Patient limited by fatigue Patient left: with call bell/phone within reach;in bed;with bed alarm set Nurse Communication: Mobility status PT Visit Diagnosis: Difficulty in walking, not elsewhere classified (R26.2)     Time: [id number] PT Time Calculation (min) (ACUTE ONLY): 22 min  Charges:  $Gait Training: 8-22 mins                     Tulio Facundo M,PT Acute Rehab Services [phone] (pager)    Alvira Philips 03/03/2021, 1:47 PM

## 2021-03-03 NOTE — Final Consult Note (Signed)
Psychiatry Consultant Final Sign-Off Note    Assessment/Final recommendations  [name] is a 40 y.o. male followed by me for suicidal ideation after drinking hand sanitizer, which was not intended to be an actual suicide attempt.   Wound care (if applicable):    Diet at discharge: per primary team   Activity at discharge: per primary team   Follow-up appointment:  Continue outpatient follow-up for medication management and therapy at the [facility]: Walk in hours are 8-11 AM Monday through Thursday for medication management. Therapy walk in hours are Monday-Wednesday 8 AM-1PM. It is first come, first -serve; it is best to arrive by 7:00 AM. On Friday from 1 pm to 4 pm for therapy intake only. Please arrive by 12:00 pm as it is first come, first -serve.    When you arrive please go upstairs for your appointment. If you are unsure of where to go, inform the front desk that you are here for a walk in appointment and they will assist you with directions upstairs.  Address:  [address], [place], [place], [place] [address] Ph: [phone]     Pending results:  Unresulted Labs (From admission, onward)    None        Medication recommendations: Will increase Trazodone to 300 mg qHS PRN insomnia. Otherwise, all other medications to remain as prescribed: Abilify 15 mg daily, Buspar 10 mg TID, Lamictal 25 mg BID.    Other recommendations: Recommend placement be arranged in a boarding home, patient agreeable to [facility] or other boarding home if no bed available.     Thank you for allowing [place] to participate in the care of your patient!  Please consult [place] again if you have further needs for your patient.  Rosezetta Schlatter, MD 03/03/2021 11:38 AM    Subjective  On assessment this morning, [name] is doing much better. He was able to get about 4 hours of sleep last night, which is an improvement. He says that he typically takes Trazodone 300 mg at  home for insomnia. He denies SI/HI/AVH, and remains to go to a boarding house and follow-up with outpatient psychiatry and therapy at [facility].   Spoke with mom, [name] [phone]) and dad: They confirmed that they are willing to help [name] financially as much as they can since they are on a fixed income. They are agreeable to him going to a boarding house.    Objective  Vital signs in last 24 hours: Temp:  [98 F (36.7 C)-99.3 F (37.4 C)] 99.1 F (37.3 C) (08/16 1100) Pulse Rate:  [86-115] 115 (08/16 1100) Resp:  [11-41] 32 (08/16 0500) BP: (114-152)/(73-92) 152/92 (08/16 0938) SpO2:  [98 %-100 %] 100 % (08/16 0328) Weight:  [67.5 kg] 67.5 kg (08/16 0500)  Psychiatric Specialty Exam:   Presentation  General Appearance: Appropriate for Environment   Eye Contact:Good   Speech:Clear and Coherent; Normal Rate   Speech Volume:Normal   Handedness:Right     Mood and Affect  Mood:Euthymic   Affect:Appropriate; Congruent     Thought Process  Thought Processes:Coherent; Goal Directed; Linear   Descriptions of Associations:Intact   Orientation:Full (Time, Place and Person)   Thought Content:WDL   History of Schizophrenia/Schizoaffective disorder:Yes   Duration of Psychotic Symptoms:Greater than six months   Hallucinations:Hallucinations: None   Ideas of Reference:None   Suicidal Thoughts:Suicidal Thoughts: No   Homicidal Thoughts:Homicidal Thoughts: No     Sensorium  Memory:Immediate Good; Recent  Good; Remote Fair   Judgment:Fair   Insight:Fair     Executive Functions  Concentration:Good   Attention Span:Good   [place] of Knowledge:Good   Language:Good     Psychomotor Activity  Psychomotor Activity:Psychomotor Activity: Normal     Assets  Assets:Communication Skills; Desire for Improvement; Resilience; Social Support     Sleep  Sleep:Sleep: Good (Good last night)       Physical  Exam: Physical Exam Vitals reviewed.  HENT:     Head: Atraumatic.     Comments: Puffiness noted in bilateral cheeks.  Eyes:     Extraocular Movements: Extraocular movements intact.  Cardiovascular:     Rate and Rhythm: Tachycardia present.  Pulmonary:     Effort: Pulmonary effort is normal.  Neurological:     General: No focal deficit present.     Mental Status: He is alert and oriented to person, place, and time.  Psychiatric:        Attention and Perception: He does not perceive auditory or visual hallucinations.        Mood and Affect: Mood normal. Mood is not depressed.        Speech: Speech normal.        Behavior: Behavior normal.        Thought Content: Thought content does not include homicidal or suicidal ideation. Thought content does not include suicidal plan.        Cognition and Memory: Cognition normal.        Judgment: Judgment normal.    Review of Systems  Psychiatric/Behavioral:  Positive for substance abuse. Negative for depression, hallucinations and suicidal ideas. The patient is not nervous/anxious and does not have insomnia.    Pertinent labs and Studies: Recent Labs    03/01/21 0558 03/03/21 0052  WBC 19.5* 13.1*  HGB 13.8 12.0*  HCT 41.6 36.4*   BMET Recent Labs    03/01/21 1416 03/03/21 0052  NA 135 135  K 3.7 3.6  CL 100 102  CO2 28 23  GLUCOSE 135* 112*  BUN 12 6  CREATININE 0.79 0.81  CALCIUM 8.4* 8.5*   No results for input(s): LABURIN in the last 72 hours. Results for orders placed or performed during the hospital encounter of 02/19/21  Resp Panel by RT-PCR (Flu A&B, Covid) Nasopharyngeal Swab     Status: None   Collection Time: 02/19/21  9:17 PM   Specimen: Nasopharyngeal Swab; Nasopharyngeal(NP) swabs in vial transport medium  Result Value Ref Range Status   SARS Coronavirus 2 by RT PCR NEGATIVE NEGATIVE Final    Comment: (NOTE) SARS-CoV-2 target nucleic acids are NOT DETECTED.  The SARS-CoV-2 RNA is generally detectable in  upper respiratory specimens during the acute phase of infection. The lowest concentration of SARS-CoV-2 viral copies this assay can detect is 138 copies/mL. A negative result does not preclude SARS-Cov-2 infection and should not be used as the sole basis for treatment or other patient management decisions. A negative result may occur with  improper specimen collection/handling, submission of specimen other than nasopharyngeal swab, presence of viral mutation(s) within the areas targeted by this assay, and inadequate number of viral copies(<138 copies/mL). A negative result must be combined with clinical observations, patient history, and epidemiological information. The expected result is Negative.  Fact Sheet for Patients:  EntrepreneurPulse.com.au  Fact Sheet for Healthcare Providers:  IncredibleEmployment.be  This test is no t yet approved or cleared by the [place] FDA and  has been authorized for detection and/or  diagnosis of SARS-CoV-2 by FDA under an Emergency Use Authorization (EUA). This EUA will remain  in effect (meaning this test can be used) for the duration of the COVID-19 declaration under Section 564(b)(1) of the Act, 21 U.S.C.section 360bbb-3(b)(1), unless the authorization is terminated  or revoked sooner.       Influenza A by PCR NEGATIVE NEGATIVE Final   Influenza B by PCR NEGATIVE NEGATIVE Final    Comment: (NOTE) The Xpert Xpress SARS-CoV-2/FLU/RSV plus assay is intended as an aid in the diagnosis of influenza from Nasopharyngeal swab specimens and should not be used as a sole basis for treatment. Nasal washings and aspirates are unacceptable for Xpert Xpress SARS-CoV-2/FLU/RSV testing.  Fact Sheet for Patients: EntrepreneurPulse.com.au  Fact Sheet for Healthcare Providers: IncredibleEmployment.be  This test is not yet approved or cleared by the [place] FDA and has been  authorized for detection and/or diagnosis of SARS-CoV-2 by FDA under an Emergency Use Authorization (EUA). This EUA will remain in effect (meaning this test can be used) for the duration of the COVID-19 declaration under Section 564(b)(1) of the Act, 21 U.S.C. section 360bbb-3(b)(1), unless the authorization is terminated or revoked.  Performed at [facility], [place] [address] [place], [place] [address]   Urine Culture     Status: Abnormal   Collection Time: 02/20/21 11:52 AM   Specimen: Urine, Clean Catch  Result Value Ref Range Status   Specimen Description URINE, CLEAN CATCH  Final   Special Requests   Final    NONE Performed at [facility], [place] [address] [place], [place] [address]    Culture MULTIPLE SPECIES PRESENT, SUGGEST RECOLLECTION (A)  Final   Report Status 02/21/2021 FINAL  Final  MRSA Next Gen by PCR, Nasal     Status: None   Collection Time: 02/20/21  2:24 PM   Specimen: Nasal Mucosa; Nasal Swab  Result Value Ref Range Status   MRSA by PCR Next Gen NOT DETECTED NOT DETECTED Final    Comment: (NOTE) The GeneXpert MRSA Assay (FDA approved for NASAL specimens only), is one component of a comprehensive MRSA colonization surveillance program. It is not intended to diagnose MRSA infection nor to guide or monitor treatment for MRSA infections. Test performance is not FDA approved in patients less than 83 years old. Performed at [facility], [place] [address], [place], [place] [address]   Culture, blood (routine x 2)     Status: None   Collection Time: 02/20/21  2:53 PM   Specimen: BLOOD LEFT HAND  Result Value Ref Range Status   Specimen Description BLOOD LEFT HAND  Final   Special Requests   Final    BOTTLES DRAWN AEROBIC ONLY Blood Culture results may not be optimal due to an inadequate volume of blood received in culture bottles   Culture   Final    NO GROWTH 5 DAYS Performed at [facility], [place] [address], [place], [place] [address]     Report Status 02/25/2021 FINAL  Final  Culture, blood (routine x 2)     Status: None   Collection Time: 02/20/21  3:03 PM   Specimen: BLOOD RIGHT ARM  Result Value Ref Range Status   Specimen Description BLOOD RIGHT ARM  Final   Special Requests   Final    BOTTLES DRAWN AEROBIC AND ANAEROBIC Blood Culture adequate volume   Culture   Final    NO GROWTH 5 DAYS Performed at [facility], [address] [place], [place] [address]    Report Status 02/25/2021 FINAL  Final    Imaging: No results found.   Patient is determined to be psychiatrically stable at this time. Psychiatry will sign off. Please do not hesitate to call back if questions arise. Thank you for this consult.    Rosezetta Schlatter, MD PGY-1 03/03/2021 [place] Department of Psychiatry

## 2021-03-03 NOTE — Progress Notes (Signed)
HD#11 Subjective:  Brief History:  [name] is a 40 year old male with PMHx of hypertension, hyperlipidemia, cirrhosis, bipolar disorder, polysubstance use, MDD with recent admission to [facility] 6/22-6/27 for suicide attempt with seroquel and trazodone. He presented on 8/4 with abdominal pain following ingestion of two bottles of hand sanitizer requiring admission to PCCM. He was intubated and started on CRRT. He was extubated on 8/9 but required reintubation on 8/10 for continued respiratory distress. He completed a 5 day course of levaquin for pneumonia and was extubated on 8/13. Psychiatry has been consulted and patient initially recommended for inpatient behavioral health admission; however, recommendations adjusted to discharge to boarding home as patient noted that ingestion was not in setting of suicidal attempt.   Overnight Events: No acute overnight events reported.  Interim History:[name] was evaluated at bedside this morning prior to physical therapy session. He is resting comfortably on bedside chair. Patient reports that he has been drinking a bottle of hand sanitizer for th past month for the euphoria. On day of admission, he drank two bottles but this was not for suicidal intention but rather just the euphoric effect. He denies any acute concerns at this time.  Objective:  Vital signs in last 24 hours: Vitals:   03/03/21 0200 03/03/21 0328 03/03/21 0400 03/03/21 0500  BP:  118/79    Pulse:  87    Resp: (!) 39 (!) 24 11 (!) 32  Temp:  98.9 F (37.2 C)    TempSrc:  Oral    SpO2:  100%    Weight:    67.5 kg  Height:       Supplemental O2: Nasal Cannula SpO2: 100 % O2 Flow Rate (L/min): 5 L/min FiO2 (%): 40 %   Physical Exam:  Constitutional: well-appearing young male sitting in bedside chair, in no acute distress HENT: normocephalic atraumatic, mucous membranes moist Eyes: conjunctiva non-erythematous Neck: supple Cardiovascular: tachycardic,  S1 and S2  present, no m/r/g, distal pulses present  Pulmonary/Chest: diffuse crackles with mildly depressed breath sounds at bilateral bases; on 5L [place] Abdominal: soft, non-tender, non-distended, normal bowel sounds  MSK: normal bulk and tone Neurological: alert & oriented x 3, 5/5 strength in bilateral upper and lower extremities, normal gait Skin: warm and dry Psych: normal mood and affect, denies any SI/HI at this time   [facility] Weights   03/01/21 0405 03/02/21 0500 03/03/21 0500  Weight: 69 kg 68.2 kg 67.5 kg     Intake/Output Summary (Last 24 hours) at 03/03/2021 0648 Last data filed at 03/03/2021 0600 Gross per 24 hour  Intake 1070 ml  Output 2025 ml  Net -955 ml   Net IO Since Admission: -5,950.13 mL [03/03/21 0648]  Pertinent Labs: CBC Latest Ref Rng & Units 03/03/2021 03/01/2021 02/28/2021  WBC 4.0 - 10.5 K/uL 13.1(H) 19.5(H) 14.6(H)  Hemoglobin 13.0 - 17.0 g/dL 12.0(L) 13.8 12.2(L)  Hematocrit 39.0 - 52.0 % 36.4(L) 41.6 35.4(L)  Platelets 150 - 400 K/uL 328 PLATELET CLUMPS NOTED ON SMEAR, UNABLE TO ESTIMATE 210    CMP Latest Ref Rng & Units 03/03/2021 03/01/2021 02/28/2021  Glucose 70 - 99 mg/dL 112(H) 135(H) 108(H)  BUN 6 - 20 mg/dL 6 12 32(H)  Creatinine 0.61 - 1.24 mg/dL 0.81 0.79 1.13  Sodium 135 - 145 mmol/L 135 135 136  Potassium 3.5 - 5.1 mmol/L 3.6 3.7 4.0  Chloride 98 - 111 mmol/L 102 100 103  CO2 22 - 32 mmol/L '23 28 22  '$ Calcium 8.9 -  10.3 mg/dL 8.5(L) 8.4(L) 8.7(L)  Total Protein 6.5 - 8.1 g/dL - - -  Total Bilirubin 0.3 - 1.2 mg/dL - - -  Alkaline Phos 38 - 126 U/L - - -  AST 15 - 41 U/L - - -  ALT 0 - 44 U/L - - -    Imaging: No results found.  Assessment/Plan:   Principal Problem:   Hand sanitizer poisoning Active Problems:   Depression, major, severe recurrence (HCC)   Respiratory failure (HCC)   Metabolic acidosis   Suicide attempt ([place])   AKI (acute kidney injury) ([place])   Acute respiratory failure with hypoxemia ([place])   Patient Summary: [name] is a 40 y.o. with a pertinent PMH of hypertension, hyperlipidemia, bipolar disorder, cirrhosis, polysubstance use, MDD with recent admission to [facility] for suicidal ideation, who presented with abdominal pain, anion gap metabolic acidosis and acute renal failure and admitted for acute renal failure in setting of ethylene glycol/isopropyl poisoning. Hospital course complicated by acute hypoxic respiratory failure requiring intubation   Isopropyl Alcohol Ingestion in setting of extensive psychiatric history  Patient admitted with abdominal pain, high anion gap metabolic acidosis and acute renal failure in setting of isopropyl alcohol ingestion requiring ICU admission. He has an extensive psychiatric history including bipolar disorder, MDD with psychotic features, polysubstance use, generalized anxiety disorder, and prior suicide attempts, most recently requiring admission to [facility] 6/22-6/27 for intentional seroquel and trazodone overdose. He reports that he was drinking hand sanitizer for the euphoric effect and denies any suicidal ideation at this time. - Psychiatry consulted, appreciate their recommendations  - Continue Abilify '15mg'$  daily, Buspar '10mg'$  tid, Lamictal '25mg'$  bid and trazodone '100mg'$  qHS prn - Patient to be discharged to a boarding house once able   Acute hypoxic respiratory failure with aspiration pneumonitis S/p intubation x2, extubated on 8/13 to 4L oxygen via nasal cannula. He is s/p 5 day course of levaquin. CXR from 8/14 with persistent bilateral pulmonary infiltrates. His oxygen requirement is improving overall and is down to 3L this morning. Will hold off on further antibiotics at this time - Continue to wean O2 as tolerated - Ambulatory pulse ox   Dyslipidemia Continue Statin  Hypertension Continue amlodipine '5mg'$  daily  Diet: Normal IVF: None,None VTE:  Xarelto Code: Full PT/OT recs: None, none. TOC recs: Discharge to boarding home per psych recommendation  Prior to  Admission Living Arrangement: Home Anticipated Discharge Location: Boarding Home Barriers to Discharge: Placement  Dispo: Anticipated discharge to [facility] pending placement.   Harvie Heck, MD Internal Medicine Resident PGY-3 Pager# [phone]  Please contact the on call pager after 5 pm and on weekends at [phone].

## 2021-03-03 NOTE — Telephone Encounter (Signed)
Entered in error

## 2021-03-04 MED ORDER — ARIPIPRAZOLE 15 MG PO TABS: 15.0000 mg | ORAL_TABLET | Freq: Every day | ORAL | 0 refills | Status: DC

## 2021-03-04 MED ORDER — TRAZODONE HCL 300 MG PO TABS: 300.0000 mg | ORAL_TABLET | Freq: Every evening | ORAL | 0 refills | Status: DC | PRN

## 2021-03-04 NOTE — Progress Notes (Signed)
Occupational Therapy Treatment Patient Details Name: [name] MRN: [id number] DOB: Apr 01, 1981 Today's Date: 03/04/2021    History of present illness 40 year old person presented to ED 02/20/21 seeking help after drinking 2 bottles of hand sanitizer. Intubated 02/21/21-02/24/21. Post-extubation placed on HHFNC+NRB. CXR with concern for colonic ileus. Cortrak placed 8/10. Patient with respiratory distress on the evening of 8/10 s/p intubation. Ileus 8/11, extubated 8/13; PMH-bipolar depression, breast cancer status post bilateral mastectomy, anxiety, depression, schizophrenia, and recent overdose suicide attempt.   OT comments  Patient making great progress toward goals. OT treatment session with focus on household and community mobility, higher level balance and activity tolerance. Patient reports plan for d/c from hospital later this date. Education provided on use of RW for functional mobility and activity pacing. Patient initially on RA upon entry with SpO2 93%. With mobility, poor pleth throughout. When pleth reliable, SpO2 87-88%. Donned 2L O2 via [place] with SpO2 improving to high 90's with continued questionable pleth. Patient cleared by psych with recommendation updated to outpatient behavioral health for substance abuse.    Follow Up Recommendations  No OT follow up    Equipment Recommendations   (rolling walker)    Recommendations for Other Services      Precautions / Restrictions Precautions Precautions: Other (comment) Precaution Comments: Monitor vitals Restrictions Weight Bearing Restrictions: No       Mobility Bed Mobility               General bed mobility comments: Patient seated in recliner upon entry.    Transfers Overall transfer level: Needs assistance Equipment used: Rolling walker (2 wheeled) Transfers: Sit to/from [facility] Sit to Stand: Min guard Stand pivot transfers: Min guard       General transfer comment: Min guard to stand  from recliner    Balance Overall balance assessment: Needs assistance Sitting-balance support: No upper extremity supported;Feet supported Sitting balance-Leahy Scale: Normal     Standing balance support: During functional activity;Bilateral upper extremity supported Standing balance-Leahy Scale: Fair Standing balance comment: Pt needs min assist for dynamic activity and min guard for static stance.                 Standardized Balance Assessment Standardized Balance Assessment : Dynamic Gait Index   Dynamic Gait Index Level Surface: Moderate Impairment Gait with Horizontal Head Turns: Moderate Impairment Gait with Vertical Head Turns: Mild Impairment Gait and Pivot Turn: Moderate Impairment Step Over Obstacle: Normal     ADL either performed or assessed with clinical judgement   ADL                                               Vision       Perception     Praxis      Cognition Arousal/Alertness: Awake/alert Behavior During Therapy: WFL for tasks assessed/performed Overall Cognitive Status: Within Functional Limits for tasks assessed                                          Exercises Exercises: General Upper Extremity;General Lower Extremity General Exercises - Upper Extremity Shoulder Flexion: AROM;Both;Seated Elbow Flexion: AROM;Both;Seated General Exercises - Lower Extremity Ankle Circles/Pumps: AROM;Both;5 reps;Seated Long Arc Quad: AROM;Both;5 reps;Seated Hip Flexion/Marching: AROM;Both;5 reps;Seated   Shoulder Instructions  General Comments 93% on RA per OT prior to walk.  Pt did not need O2 with ambulation once dynamap used as his O2 >90% on RA.    Pertinent Vitals/ Pain       Pain Assessment: No/denies pain  Home Living                                          Prior Functioning/Environment              Frequency  Min 2X/week        Progress Toward Goals  OT  Goals(current goals can now be found in the care plan section)  Progress towards OT goals: Progressing toward goals  Acute Rehab OT Goals Patient Stated Goal: get better; be able to walk OT Goal Formulation: With patient Time For Goal Achievement: 03/12/21 Potential to Achieve Goals: Good ADL Goals Pt Will Perform Upper Body Bathing: Independently Pt Will Perform Lower Body Bathing: Independently Pt Will Perform Upper Body Dressing: Independently Pt Will Perform Lower Body Dressing: Independently Pt Will Transfer to Toilet: Independently Pt Will Perform Toileting - Clothing Manipulation and hygiene: Independently Pt Will Perform Tub/Shower Transfer: Shower transfer;ambulating;Independently  Plan Discharge plan needs to be updated;Frequency remains appropriate    Co-evaluation                 AM-PAC OT "6 Clicks" Daily Activity     Outcome Measure   Help from another person eating meals?: None Help from another person taking care of personal grooming?: A Little Help from another person toileting, which includes using toliet, bedpan, or urinal?: A Little Help from another person bathing (including washing, rinsing, drying)?: A Little Help from another person to put on and taking off regular upper body clothing?: A Little Help from another person to put on and taking off regular lower body clothing?: None 6 Click Score: 20    End of Session Equipment Utilized During Treatment: Gait belt;Oxygen (No OT upon entry, 2L with mobility)  OT Visit Diagnosis: Muscle weakness (generalized) (M62.81)   Activity Tolerance Patient tolerated treatment well   Patient Left in chair;with call bell/phone within reach;with nursing/sitter in room   Nurse Communication Mobility status;Other (comment) (Response to treatment)        Time: [id number] OT Time Calculation (min): 22 min  Charges: OT General Charges $OT Visit: 1 Visit OT Treatments $Therapeutic Activity: 8-22  mins  Lizzy Hamre H. OTR/L Supplemental OT, Department of rehab services [phone]   Cherye Gaertner R H. 03/04/2021, 12:08 PM

## 2021-03-04 NOTE — Progress Notes (Signed)
Physical Therapy Treatment Patient Details Name: [name] MRN: [id number] DOB: 1980/08/20 Today's Date: 03/04/2021    History of Present Illness 40 year old person presented to ED 02/20/21 seeking help after drinking 2 bottles of hand sanitizer. Intubated 02/21/21-02/24/21. Post-extubation placed on HHFNC+NRB. CXR with concern for colonic ileus. Cortrak placed 8/10. Patient with respiratory distress on the evening of 8/10 s/p intubation. Ileus 8/11, extubated 8/13; PMH-bipolar depression, breast cancer status post bilateral mastectomy, anxiety, depression, schizophrenia, and recent overdose suicide attempt.    PT Comments    Pt admitted with above diagnosis. Pt was able to ambulate on unit without device with min assist needed at times with challenges to balance. Pt overall doing better and did not need O2 with activity today.  Pt states he may go home today. Will need RW for safety.  Pt currently with functional limitations due to balance and endurance deficits. Pt will benefit from skilled PT to increase their independence and safety with mobility to allow discharge to the venue listed below.      Follow Up Recommendations  Other (comment);Home health PT (Boarding house vs home with parents)     Equipment Recommendations  Rolling walker with 5" wheels    Recommendations for Other Services       Precautions / Restrictions Precautions Precautions: Other (comment) Precaution Comments: Monitor vitals Restrictions Weight Bearing Restrictions: No    Mobility  Bed Mobility               General bed mobility comments: Pt walking with OT on arrival to unit    Transfers Overall transfer level: Needs assistance Equipment used: Rolling walker (2 wheeled) Transfers: Sit to/from Stand;Stand Pivot Transfers Sit to Stand: Min guard Stand pivot transfers: Min guard       General transfer comment: Min guard to stand from recliner  Ambulation/Gait Ambulation/Gait assistance: Min  assist;Min guard Gait Distance (Feet): 560 Feet Assistive device: None Gait Pattern/deviations: Decreased stride length;Step-through pattern;Staggering left;Staggering right;Drifts right/left   Gait velocity interpretation: <1.31 ft/sec, indicative of household ambulator General Gait Details: Pt was able to ambulate on unit without device with LOB at times with turns and with challenges to balance. Pt progressing well overall.  Would benefit from use of RW for stability at current level.   Stairs             Wheelchair Mobility    Modified Rankin (Stroke Patients Only)       Balance Overall balance assessment: Needs assistance Sitting-balance support: No upper extremity supported;Feet supported Sitting balance-Leahy Scale: Normal     Standing balance support: During functional activity;Bilateral upper extremity supported Standing balance-Leahy Scale: Fair Standing balance comment: Pt needs min assist for dynamic activity and min guard for static stance.                 Standardized Balance Assessment Standardized Balance Assessment : Dynamic Gait Index   Dynamic Gait Index Level Surface: Moderate Impairment Gait with Horizontal Head Turns: Moderate Impairment Gait with Vertical Head Turns: Mild Impairment Gait and Pivot Turn: Moderate Impairment Step Over Obstacle: Normal      Cognition Arousal/Alertness: Awake/alert Behavior During Therapy: WFL for tasks assessed/performed Overall Cognitive Status: Within Functional Limits for tasks assessed                                        Exercises General Exercises - Upper Extremity Shoulder Flexion: AROM;Both;Seated  Elbow Flexion: AROM;Both;Seated General Exercises - Lower Extremity Ankle Circles/Pumps: AROM;Both;5 reps;Seated Long Arc Quad: AROM;Both;5 reps;Seated Hip Flexion/Marching: AROM;Both;5 reps;Seated    General Comments General comments (skin integrity, edema, etc.): 93% on RA  per OT prior to walk.  Pt did not need O2 with ambulation once dynamap used as his O2 >90% on RA.      Pertinent Vitals/Pain Pain Assessment: No/denies pain    Home Living                      Prior Function            PT Goals (current goals can now be found in the care plan section) Acute Rehab PT Goals Patient Stated Goal: get better; be able to walk Progress towards PT goals: Progressing toward goals    Frequency    Min 3X/week      PT Plan Discharge plan needs to be updated    Co-evaluation              AM-PAC PT "6 Clicks" Mobility   Outcome Measure  Help needed turning from your back to your side while in a flat bed without using bedrails?: None Help needed moving from lying on your back to sitting on the side of a flat bed without using bedrails?: A Little Help needed moving to and from a bed to a chair (including a wheelchair)?: A Little Help needed standing up from a chair using your arms (e.g., wheelchair or bedside chair)?: A Little Help needed to walk in hospital room?: A Little Help needed climbing 3-5 steps with a railing? : A Little 6 Click Score: 19    End of Session Equipment Utilized During Treatment: Gait belt Activity Tolerance: Patient tolerated treatment well;Patient limited by fatigue Patient left: with call bell/phone within reach;in chair;with chair alarm set Nurse Communication: Mobility status PT Visit Diagnosis: Difficulty in walking, not elsewhere classified (R26.2)     Time: [id number] PT Time Calculation (min) (ACUTE ONLY): 11 min  Charges:  $Gait Training: 8-22 mins                     Roda Lauture M,PT Acute Rehab Services [phone] (pager)    Alvira Philips 03/04/2021, 10:55 AM

## 2021-03-04 NOTE — Progress Notes (Signed)
[name] to be D/C'd Home per MD order.  Discussed with the patient and all questions fully answered.  VSS, Skin clean, dry and intact without evidence of skin break down, no evidence of skin tears noted. IV catheter discontinued intact. Site without signs and symptoms of complications. Dressing and pressure applied.  An After Visit Summary was printed and given to the patient. Patient received prescription.  D/c education completed with patient/family including follow up instructions, medication list, d/c activities limitations if indicated, with other d/c instructions as indicated by MD - patient able to verbalize understanding, all questions fully answered.   Patient instructed to return to ED, call 911, or call MD for any changes in condition.   Patient escorted via [place], and D/C home via private auto.   [place] 03/04/2021 5:34 PM

## 2021-03-04 NOTE — TOC Transition Note (Signed)
Transition of Care [facility]) - CM/SW Discharge Note   Patient Details  Name: Tallon Finkle MRN: [id number] Date of Birth: [birth year]  Transition [facility]) CM/SW Contact:  Tom-Johnson, Renea Ee, RN Phone Number: 03/04/2021, 4:35 PM   Clinical Narrative:    CM consulted for Home Health PT and DME walker. Patient does not have any Insurance. [employer] called and spoke with sheila [phone]) about getting patient a charity walker. Walker dropped off at patient's room. [place] this week is with [facility]. Called and spoke with Steffanie Dunn [phone]). States she will call back with response. Awaiting response at this time. If patient is discharged before getting response form [facility], CM will call mother or patient tomorrow with response. Mom will transport at discharge.   Final next level of care: [place] (PT) Barriers to Discharge: No Barriers Identified   Patient Goals and CMS Choice Patient states their goals for this hospitalization and ongoing recovery are:: To go home   Choice offered to / list presented to : Patient, Parent  Discharge Placement                       Discharge Plan and Services                DME Arranged: Walker rolling DME Agency: [employer] Carloyn Jaeger) Date DME Agency Contacted: 03/04/21 Time DME Agency Contacted: [id number] Representative spoke with at DME Agency: [place]: PT [place] Agency: [facility] [place] (Awaiting call to see if they will accept patient as he has no insurance.) Date HH Agency Contacted: 03/04/21 Time [place]: [place] Representative spoke with at [place]: [place] ([place]) Interventions     Readmission Risk Interventions No flowsheet data found.

## 2021-03-04 NOTE — Discharge Summary (Signed)
Name: [name] MRN: [id number] DOB: [birth year] 40 y.o. PCP: Maudie Mercury, MD  Date of Admission: 02/19/2021  4:25 PM Date of Discharge: 03/04/2021 Attending Physician: Lucious Groves, DO  Discharge Diagnosis: 1. Acute hypoxic respiratory failure with aspiration pneumonitis 2. Metabolic acidosis 2/2 to hand sanitizer poisoining 3. Depression 4. Hyperlipidemia 5. Acute renal failure 6. Alcohol use disorder  Discharge Medications: Allergies as of 03/04/2021       Reactions   Codeine Hives, Itching, Swelling, Other (See Comments)   Does not impair breathing, however   Penicillins Swelling, Other (See Comments)   Has patient had a PCN reaction causing immediate rash, facial/tongue/throat swelling, SOB or lightheadedness with hypotension: Yes Has patient had a PCN reaction causing severe rash involving mucus membranes or skin necrosis: Yes Has patient had a PCN reaction that required hospitalization Yes-ed visit Has patient had a PCN reaction occurring within the last 10 years: Yes If all of the above answers are "NO", then may proceed with Cephalosporin use.   Morphine Itching   Coconut Flavor Itching, Swelling, Other (See Comments)   Cannot take with some of his meds (also)   Coconut Oil Itching, Swelling, Other (See Comments)   Cannot take with some of his meds (also)   Grapefruit Concentrate Other (See Comments)   Cannot take with some of his meds   Morphine And Related Itching, Swelling   Oxycodone Itching, Swelling   Norco [hydrocodone-acetaminophen] Itching, Rash        Medication List     STOP taking these medications    sertraline 25 MG tablet Commonly known as: ZOLOFT       TAKE these medications    amLODipine 10 MG tablet Commonly known as: NORVASC Take 1 tablet (10 mg total) by mouth daily.   ARIPiprazole 15 MG tablet Commonly known as: ABILIFY Take 1 tablet (15 mg total) by mouth daily. What changed:  medication strength how much to  take   busPIRone 10 MG tablet Commonly known as: BUSPAR Take 1 tablet (10 mg total) by mouth 3 (three) times daily.   hydrOXYzine 50 MG capsule Commonly known as: VISTARIL Take 50 mg by mouth 3 (three) times daily as needed for anxiety.   ibuprofen 200 MG tablet Commonly known as: ADVIL Take 400 mg by mouth every 6 (six) hours as needed for headache.   lamoTRIgine 25 MG tablet Commonly known as: LAMICTAL Take 1 tablet (25 mg total) by mouth 2 (two) times daily.   pantoprazole 40 MG tablet Commonly known as: PROTONIX Take 1 tablet (40 mg total) by mouth daily.   rosuvastatin 10 MG tablet Commonly known as: CRESTOR Take 10 mg by mouth daily.   trazodone 300 MG tablet Commonly known as: DESYREL Take 1 tablet (300 mg total) by mouth at bedtime as needed for sleep (Insomnia). What changed:  medication strength how much to take reasons to take this               Durable Medical Equipment  (From admission, onward)           Start     Ordered   03/04/21 1422  DME Walker  Once       Question Answer Comment  Walker: With 5 Inch Wheels   Patient needs a walker to treat with the following condition Shortness of breath      03/04/21 1429            Disposition and follow-up:   [name] was  discharged from [facility] in Stable condition.  At the hospital follow up visit please address:  Acute hypoxic respiratory failure with aspiration pneumonitis: Patient had hypoxic respiratory failure and is status post intubation. He also completed Levaquin for aspiration pneumonia. Currently he is breathing well and not requiring oxygen at rest. He did desat a to the 80s with ambulation but that resolved as well. Currently this appears to be due to deconditioning. Monitor at follow up.  Acute Renal Failure: Patient had acute renal failure after consumption of hand sanitizer. This resolved before discharge. Please check kidney function at follow  up. Depression: Patient has depression with previous suicide attempt. Please check for compliance with medications and check if patient has adequate support. Please ensure patient is following with psychiatry as he has schizophrenia, depression, and bipolar disorder along with substance use.   2.  Labs / imaging needed at time of follow-up: CBC, BMP  3.  Pending labs/ test needing follow-up: None  Follow-up Appointments: PCP appointment with Dr. Gilford Rile at [facility] follow up  Hospital Course: [name] is a 40 year old male with PMHx of hypertension, hyperlipidemia, cirrhosis, bipolar disorder, polysubstance use, MDD with recent admission to [facility] 6/22-6/27 for suicide attempt with seroquel and trazodone. He presented on 8/4 with abdominal pain following ingestion of two bottles of hand sanitizer requiring admission to PCCM. He was intubated and started on CRRT on 08/06. He was extubated on 8/9 but required reintubation on 8/10 for continued respiratory distress. He was on precedex drip from 08/06 to 08/13. He completed a 5 day course of levaquin for pneumonia and was extubated on 8/13. He was moved to inpatient service on 08/16. Psychiatry saw patient initially recommended for inpatient behavioral health admission; however, recommendations adjusted to discharge to boarding home as patient noted that ingestion was not in setting of suicidal attempt. Patient agreeable to go home due to strong support system until a boarding home can be arranged.    Discharge Subjective:  Patient examined at bedside. Reported to be doing good. Stated he is not using his oxygen and was able to walk without it. Denied any acute concerns and wanted to go home. Stated he was not suicidal when he drank hand sanitizer but wanted to get a euphoria from it. Denied any SI currently.   Discharge Exam:   BP (!) 139/105   Pulse (!) 104   Temp 98.8 F (37.1 C) (Oral)   Resp (!) 22   Ht '5\' 6"'$   (1.676 m)   Wt 67.5 kg   SpO2 92%   BMI 24.02 kg/m  Discharge exam:  Physical Exam Constitutional:      General: He is not in acute distress.    Appearance: He is well-developed. He is not ill-appearing.  HENT:     Head: Normocephalic and atraumatic.     Mouth/Throat:     Mouth: Mucous membranes are moist.     Pharynx: Oropharynx is clear. No oropharyngeal exudate.  Cardiovascular:     Rate and Rhythm: Regular rhythm. Tachycardia present.     Heart sounds: Normal heart sounds.  Pulmonary:     Effort: Pulmonary effort is normal.     Breath sounds: Normal breath sounds.  Abdominal:     General: Bowel sounds are normal. There is no distension.     Palpations: Abdomen is soft.  Skin:    General: Skin is warm and dry.     Coloration: Skin is not  jaundiced.  Neurological:     General: No focal deficit present.     Mental Status: He is alert and oriented to person, place, and time.  Psychiatric:        Mood and Affect: Mood normal.        Behavior: Behavior normal.     Pertinent Labs, Studies, and Procedures:  DG Chest 1 View  Result Date: 02/20/2021 CLINICAL DATA:  Hypoxia EXAM: CHEST  1 VIEW COMPARISON:  Chest radiograph obtained 1 day prior FINDINGS: The heart and mediastinum are prominent, exaggerated by low lung volumes and AP technique. Lung volumes are markedly diminished, worsened since the radiograph from 1 day prior. There are streaky opacities throughout both lungs with more focal opacity in the medial right base. There is likely a small right pleural effusion, new in the interim. There is no significant left effusion. There is no appreciable pneumothorax. Plate and screw fixation of the left clavicle is again seen. There is no acute osseous abnormality. IMPRESSION: Markedly low lung volumes with new streaky opacities bilaterally and probable new small right pleural effusion. Findings suggest new pulmonary interstitial edema given rapid onset; however, infection could have a  similar appearance. Electronically Signed   By: Valetta Mole MD   On: 02/20/2021 11:36   DG Chest 2 View  Result Date: 02/19/2021 CLINICAL DATA:  Abdomen pain EXAM: CHEST - 2 VIEW COMPARISON:  01/05/2021 FINDINGS: The heart size and mediastinal contours are within normal limits. Both lungs are clear. Surgical plate and fixating screws at the distal left clavicle. IMPRESSION: No active cardiopulmonary disease. Electronically Signed   By: Donavan Foil M.D.   On: 02/19/2021 17:56   US RENAL  Result Date: 02/20/2021 CLINICAL DATA:  Acute kidney injury. History of partial left nephrectomy. EXAM: RENAL / URINARY TRACT ULTRASOUND COMPLETE COMPARISON:  Abdominal ultrasound 11/25/2017 FINDINGS: Right Kidney: Renal measurements: 10.4 x 4.7 x 6.2 cm = volume: 156 mL. Echogenicity within normal limits. No mass or hydronephrosis visualized. Small volume perinephric fluid. Left Kidney: Renal measurements: 9.2 x 4.6 x 4.8 cm = volume: 107 mL. Echogenicity within normal limits. No mass or hydronephrosis visualized. Bladder: Appears normal for degree of bladder distention. Other: As previously seen, the liver is diffusely hyperechoic compatible with steatosis. Ascites and a left pleural effusion are noted. IMPRESSION: 1.  No hydronephrosis. 2. Hepatic steatosis. 3. Ascites and left pleural effusion. Electronically Signed   By: Logan Bores M.D.   On: 02/20/2021 13:48     Discharge Instructions: Discharge Instructions     Call MD for:   Complete by: As directed    Call 911 or come to the ED if you get suicidal or homicidal ideations.   Call MD for:  difficulty breathing, headache or visual disturbances   Complete by: As directed    Call MD for:  extreme fatigue   Complete by: As directed    Call MD for:  hives   Complete by: As directed    Call MD for:  persistant dizziness or light-headedness   Complete by: As directed    Call MD for:  persistant nausea and vomiting   Complete by: As directed    Call MD for:   redness, tenderness, or signs of infection (pain, swelling, redness, odor or green/yellow discharge around incision site)   Complete by: As directed    Call MD for:  severe uncontrolled pain   Complete by: As directed    Call MD for:  temperature >100.4  Complete by: As directed    Discharge instructions   Complete by: As directed    You presented to the ED after ingestion of hand sanitizer. This required extensive management and you spend 10 days in the ICU. You endorsed taking this for euphoria and not for suicide attempt. We strongly advise you to not consume this as it is toxic. We advise a follow up with your PCP and close follow up with psychiatry. PCP office will call you to set up an appointment and you stated you will make the psychiatry appointment. Please seek emergency help if you have suicidial or homicidal ideations.   Increase activity slowly   Complete by: As directed        Signed: Idamae Schuller, MD 03/04/2021, 2:36 PM   Pager: [phone]

## 2021-03-05 ENCOUNTER — Telehealth: Payer: Self-pay | Admitting: *Deleted

## 2021-03-05 NOTE — Telephone Encounter (Signed)
TOC HFU-APPOINTMENT 9/1 AT 3:15 WITH DR. Gilford Rile.

## 2021-03-05 NOTE — Progress Notes (Signed)
Spoke with Steffanie Dunn [phone]) at [facility] about charity PT services as patient does not have insurance. Steffanie Dunn states that unfortunately, they will not be able to provide charity service to patient at this time. Patient notified via phone and CM also spoke with patient's father. Patient gracious for getting the rolling walker for him. No further needs at this time.

## 2021-03-05 NOTE — Telephone Encounter (Signed)
-----   Message from Idamae Schuller, MD sent at 03/04/2021  2:36 PM EDT ----- Regarding: Hospital follow up Hello,   Can we schedule [name] a hospital follow in one week. He is being discharged today. Thanks!  Sincerely, Idamae Schuller, MD

## 2021-03-11 NOTE — Telephone Encounter (Signed)
Transition Care Management Follow-up Telephone Call Date of discharge and from where: Discharged 8/17 from the hospital. How have you been since you were released from the hospital?  Stated he has been doing "good".  Eating better and taking walks. No longer using a walker. Stated he had a tube in his throat so has been eating light foods ie jello and soups. Any questions or concerns? No  Items Reviewed: Did the pt receive and understand the discharge instructions provided? Yes  Medications obtained and verified? Pt stated discussed at discharged. Any new allergies since your discharge? No  Dietary orders reviewed? Stated he's eating better; no special diet. Do you have support at home? Yes ; lives with his parents.  Home Care and Equipment/Supplies: Were home health services ordered? no  Were any new equipment or medical supplies ordered?  Yes: Pt stated he no longer on oxygen nor using a walker.  Stated after taking a walk he's 95 %; and 98% at rest. (Stated his father has different types of equipment).  Functional Questionnaire: (I = Independent and D = Dependent) ADLs: I  Bathing/Dressing- I  Meal Prep- I  Eating- I  Maintaining continence- I  Transferring/Ambulation- I.   Managing Meds- I  Follow up appointments reviewed:  PCP Hospital f/u appt confirmed? Yes  Scheduled to see Dr Gilford Rile on 03/19/21 @ 1515PM.. [facility] f/u appt confirmed? N/A. Are transportation arrangements needed? No  If their condition worsens, is the pt aware to call PCP or go to the Emergency Dept.? Yes Was the patient provided with contact information for the PCP's office or ED? Yes Was to pt encouraged to call back with questions or concerns? Yes

## 2021-03-19 ENCOUNTER — Encounter: Payer: Self-pay | Admitting: Internal Medicine

## 2021-03-19 ENCOUNTER — Ambulatory Visit (INDEPENDENT_AMBULATORY_CARE_PROVIDER_SITE_OTHER): Payer: Self-pay | Admitting: Internal Medicine

## 2021-03-19 ENCOUNTER — Other Ambulatory Visit: Payer: Self-pay

## 2021-03-19 VITALS — BP 114/93 | HR 106 | Temp 98.7°F | Resp 24 | Ht 66.0 in | Wt 146.1 lb

## 2021-03-19 DIAGNOSIS — I10 Essential (primary) hypertension: Secondary | ICD-10-CM

## 2021-03-19 DIAGNOSIS — F0630 Mood disorder due to known physiological condition, unspecified: Secondary | ICD-10-CM

## 2021-03-19 DIAGNOSIS — F1120 Opioid dependence, uncomplicated: Secondary | ICD-10-CM

## 2021-03-19 DIAGNOSIS — J9601 Acute respiratory failure with hypoxia: Secondary | ICD-10-CM

## 2021-03-19 DIAGNOSIS — F1010 Alcohol abuse, uncomplicated: Secondary | ICD-10-CM

## 2021-03-19 DIAGNOSIS — F1920 Other psychoactive substance dependence, uncomplicated: Secondary | ICD-10-CM
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
## 2021-03-19 NOTE — Patient Instructions (Signed)
To [place],   It was so good seeing you again! Today we discussed your recent hospitalization and neuropathic pain.   For your recent hospital stay, I am glad to see you with [place]. I am happy to hear that you have stopped smoking, drinking, and using recreational drugs. Please continue to do so, and follow up with psychiatry.   For your neuropathic pain, I will assess your kidney function today before prescribing your gabapentin. I will hold off on neurology referral until you qualify for the orange card, so please turn in your orange card packet as soon as possible.   I hope you have a good Labor Day weekend, and I will see you in three months. If you need to be seen sooner, please call the clinic.  Maudie Mercury, MD

## 2021-03-20 ENCOUNTER — Encounter: Payer: Self-pay | Admitting: Internal Medicine

## 2021-03-20 LAB — BMP8+ANION GAP
Anion Gap: 16 mmol/L (ref 10.0–18.0)
BUN/Creatinine Ratio: 6 — ABNORMAL LOW (ref 9–20)
BUN: 5 mg/dL — ABNORMAL LOW (ref 6–24)
CO2: 23 mmol/L (ref 20–29)
Calcium: 9.2 mg/dL (ref 8.7–10.2)
Chloride: 99 mmol/L (ref 96–106)
Creatinine, Ser: 0.81 mg/dL (ref 0.76–1.27)
Glucose: 80 mg/dL (ref 65–99)
Potassium: 4 mmol/L (ref 3.5–5.2)
Sodium: 138 mmol/L (ref 134–144)
eGFR: 114 mL/min/{1.73_m2} (ref >59)

## 2021-03-20 LAB — CBC
Hematocrit: 39.9 % (ref 37.5–51.0)
Hemoglobin: 13.1 g/dL (ref 13.0–17.7)
MCH: 27.7 pg (ref 26.6–33.0)
MCHC: 32.8 g/dL (ref 31.5–35.7)
MCV: 84 fL (ref 79–97)
Platelets: 371 10*3/uL (ref 150–450)
RBC: 4.73 x10E6/uL (ref 4.14–5.80)
RDW: 14.4 % (ref 11.6–15.4)
WBC: 6.7 10*3/uL (ref 3.4–10.8)

## 2021-03-20 NOTE — Assessment & Plan Note (Signed)
Patient with history of PTSD, MDD (with SI and previous SA), and bipolar 1 he states that he is now following with behavioral health and is currently on several medications which he has an adequate supply.  He has walk-in appointments with [facility] Monday, Wednesday, and Friday.  He is currently on Abilify, BuSpar, lamotrigine, hydroxyzine, and trazodone.  He will be seeing [facility] on 03/20/2021. - Continue current medication regimen - Continue to follow-up with [facility] for further management.

## 2021-03-20 NOTE — Assessment & Plan Note (Signed)
>>  ASSESSMENT AND PLAN FOR ALCOHOL  ABUSE WRITTEN ON 03/20/2021  9:17 AM BY Freada Jacobs, MD  Patient states that his last hospital visit was a "wake-up call" and since his discharge he has abstaining from alcohol  use.

## 2021-03-20 NOTE — Assessment & Plan Note (Signed)
Patient presenting for follow-up hospital visit.  In summary patient consumed hand sanitizer which led to acute renal failure and hypoxic respiratory failure secondary to aspiration pneumonia, which required a full course of Levaquin and intubation with a prolonged course in the ICU.  Patient states that he no longer requires supplemental oxygen, and is able to breathe on his own, but does endorse feeling generally weak.  He denies any wheezing, rales, cough.  His last CBC did show a resolving leukocytosis we will repeat CBC today. - CBC

## 2021-03-20 NOTE — Assessment & Plan Note (Signed)
Patient states that his last hospital visit was a "wake-up call" and since his discharge he has abstaining from alcohol use.

## 2021-03-20 NOTE — Assessment & Plan Note (Signed)
Patient presents for hospital follow-up after ingesting hand sanitizer, which led to aspiration pneumonia requiring intubation and acute renal failure.  Patient states that he has had suicidal ideation in the past with an attempt.  He states that his ingestion of the hand sanitizer was not a suicide attempt, but initially started as a "cleansing" trend he is on TikTok.  He states that he noticed a state of euphoria with hand sanitizer ingestion, and became addicted.  This was not an intentional SA.  Since his hospital stay, patient states he has not taken any drugs including cocaine, alcohol, hand sanitizer, opiates. He states that he is happy to still be alive, and is trying to find stability.  He states that he has moved in with his parents which has been very helpful and he feels secure at his parents house, and they keep him accountable.  He is not abuse any substance since his discharge.  He was congratulated for this impressive feat. - Will continue to monitor at further visits.

## 2021-03-20 NOTE — Assessment & Plan Note (Signed)
Seen for hospital follow-up visit with a diagnosis of seizure disorder.  He is currently working towards obtaining his orange card in order to follow-up with neurology.  During the ED visit neurology was consulted on the February 09, 2021 with recommendation to increase his gabapentin to 300 mg in the morning and afternoon and 600 mg at night.  We will assess his kidney function today and restart regimen in the interim. - BMP - If kidney function allows will start gabapentin 300 in the morning and afternoon and 600 mg at night. - Once obtains orange card will refer to neurology.

## 2021-03-24 NOTE — Progress Notes (Signed)
Internal Medicine Clinic Attending ? ?Case discussed with Dr. Winters  At the time of the visit.  We reviewed the resident?s history and exam and pertinent patient test results.  I agree with the assessment, diagnosis, and plan of care documented in the resident?s note.  ?

## 2021-03-30 ENCOUNTER — Telehealth: Payer: Self-pay | Admitting: Internal Medicine

## 2021-03-30 MED ORDER — GABAPENTIN 300 MG PO CAPS: 300.0000 mg | ORAL_CAPSULE | Freq: Every day | ORAL | 3 refills | Status: DC

## 2021-03-30 MED ORDER — GABAPENTIN 300 MG PO CAPS: 300.0000 mg | ORAL_CAPSULE | Freq: Three times a day (TID) | ORAL | 2 refills | Status: DC

## 2021-03-30 NOTE — Telephone Encounter (Signed)
Pt requesting his Lab results.

## 2021-03-31 ENCOUNTER — Telehealth: Payer: Self-pay | Admitting: Internal Medicine

## 2021-03-31 ENCOUNTER — Other Ambulatory Visit (HOSPITAL_COMMUNITY): Payer: Self-pay | Admitting: Psychiatry

## 2021-03-31 DIAGNOSIS — F411 Generalized anxiety disorder: Secondary | ICD-10-CM

## 2021-03-31 NOTE — Telephone Encounter (Signed)
Call [place] - spoke to [place], explained Gabapentin 1 BID (am and lunch) and 2 at bedtime for total of 1200 mg.

## 2021-03-31 NOTE — Telephone Encounter (Signed)
Refill Request  2 prescriptions sent over yesterday for the following medication refill.  Pleas contact the pharmacy to clarify which one needs to be filled.  gabapentin (NEURONTIN) 300 MG capsule gabapentin (NEURONTIN) 300 MG capsule   [address] [place], [place] (Ph: [phone])

## 2021-04-09 ENCOUNTER — Telehealth (INDEPENDENT_AMBULATORY_CARE_PROVIDER_SITE_OTHER): Payer: No Payment, Other | Admitting: Psychiatry

## 2021-04-09 ENCOUNTER — Encounter (HOSPITAL_COMMUNITY): Payer: Self-pay | Admitting: Psychiatry

## 2021-04-09 ENCOUNTER — Ambulatory Visit (INDEPENDENT_AMBULATORY_CARE_PROVIDER_SITE_OTHER): Payer: Self-pay | Admitting: Internal Medicine

## 2021-04-09 ENCOUNTER — Encounter: Payer: Self-pay | Admitting: Internal Medicine

## 2021-04-09 DIAGNOSIS — I10 Essential (primary) hypertension: Secondary | ICD-10-CM

## 2021-04-09 DIAGNOSIS — F315 Bipolar disorder, current episode depressed, severe, with psychotic features: Secondary | ICD-10-CM

## 2021-04-09 DIAGNOSIS — F411 Generalized anxiety disorder: Secondary | ICD-10-CM

## 2021-04-09 MED ORDER — AMLODIPINE BESYLATE 10 MG PO TABS: 10.0000 mg | ORAL_TABLET | Freq: Every day | ORAL | 3 refills | Status: DC

## 2021-04-09 MED ORDER — TRAZODONE HCL 300 MG PO TABS: 300.0000 mg | ORAL_TABLET | Freq: Every evening | ORAL | 3 refills | Status: DC | PRN

## 2021-04-09 MED ORDER — HYDROXYZINE HCL 50 MG PO TABS: 50.0000 mg | ORAL_TABLET | Freq: Three times a day (TID) | ORAL | 3 refills | Status: DC | PRN

## 2021-04-09 MED ORDER — ROSUVASTATIN CALCIUM 10 MG PO TABS: 10.0000 mg | ORAL_TABLET | Freq: Every day | ORAL | 3 refills | Status: DC

## 2021-04-09 MED ORDER — BUSPIRONE HCL 10 MG PO TABS: 10.0000 mg | ORAL_TABLET | Freq: Three times a day (TID) | ORAL | 3 refills | Status: DC

## 2021-04-09 MED ORDER — ARIPIPRAZOLE 15 MG PO TABS: 15.0000 mg | ORAL_TABLET | Freq: Every day | ORAL | 3 refills | Status: DC

## 2021-04-09 MED ORDER — LAMOTRIGINE 25 MG PO TABS: 25.0000 mg | ORAL_TABLET | Freq: Two times a day (BID) | ORAL | 3 refills | Status: DC

## 2021-04-09 NOTE — Patient Instructions (Addendum)
[name],   It was good seeing you again! Today we talked about your medication refills and blood pressure. I have refilled your Crestor and Amlodipine for 90 days with 3 refills so you will have a year supply. Your blood pressure was elevated today, but that is likely due to being off the amlodipine. I will have you follow back with the clinic in 1 week to recheck your blood pressures. Have a great day!  Maudie Mercury, MD
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
## 2021-04-09 NOTE — Assessment & Plan Note (Signed)
Vitals with BMI 04/09/2021 04/09/2021 03/19/2021  Height - 5\' 6"  -  Weight - 153 lbs 3 oz -  BMI - 15.87 -  Systolic 276 184 859  Diastolic 96 95 93  Pulse 85 87 106    Patient presents to the clinic for refills on his amlodipine 10 mg daily. His pressures are currently uncontrolled, likely secondary to being out of his medications for a month. He is asymptomatic at this time. Will refill amlodipine and have him follow back in one week.  - Refill Amlodipine 10 mg daily  - Follow up Vital check in 1 week - If blood pressure equalizes, please schedule for PCP follow up 07/2021
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
## 2021-04-10 ENCOUNTER — Other Ambulatory Visit (HOSPITAL_COMMUNITY): Payer: Self-pay | Admitting: *Deleted

## 2021-04-10 DIAGNOSIS — F411 Generalized anxiety disorder: Secondary | ICD-10-CM

## 2021-04-10 DIAGNOSIS — F315 Bipolar disorder, current episode depressed, severe, with psychotic features: Secondary | ICD-10-CM

## 2021-04-10 MED ORDER — BUSPIRONE HCL 10 MG PO TABS
10.0000 mg | ORAL_TABLET | Freq: Three times a day (TID) | ORAL | 3 refills | Status: DC
Start: 2021-04-10 — End: 2021-07-14

## 2021-04-10 MED ORDER — LAMOTRIGINE 25 MG PO TABS: 25.0000 mg | ORAL_TABLET | Freq: Two times a day (BID) | ORAL | 3 refills | Status: DC

## 2021-04-10 NOTE — Progress Notes (Signed)
Internal Medicine Clinic Attending ? ?Case discussed with Dr. Winters  At the time of the visit.  We reviewed the resident?s history and exam and pertinent patient test results.  I agree with the assessment, diagnosis, and plan of care documented in the resident?s note.  ?

## 2021-07-14 ENCOUNTER — Ambulatory Visit (HOSPITAL_COMMUNITY)
Admission: EM | Admit: 2021-07-14 | Discharge: 2021-07-14 | Disposition: A | Discharge status: Home / Self Care | Payer: No Payment, Other | Attending: Psychiatry | Admitting: Psychiatry

## 2021-07-14 ENCOUNTER — Telehealth (INDEPENDENT_AMBULATORY_CARE_PROVIDER_SITE_OTHER): Payer: No Payment, Other | Admitting: Psychiatry

## 2021-07-14 ENCOUNTER — Encounter (HOSPITAL_COMMUNITY): Payer: Self-pay | Admitting: Psychiatry

## 2021-07-14 DIAGNOSIS — R45851 Suicidal ideations: Secondary | ICD-10-CM | POA: Insufficient documentation

## 2021-07-14 DIAGNOSIS — F10129 Alcohol abuse with intoxication, unspecified: Secondary | ICD-10-CM

## 2021-07-14 DIAGNOSIS — F319 Bipolar disorder, unspecified: Secondary | ICD-10-CM | POA: Insufficient documentation

## 2021-07-14 DIAGNOSIS — Z62810 Personal history of physical and sexual abuse in childhood: Secondary | ICD-10-CM | POA: Insufficient documentation

## 2021-07-14 DIAGNOSIS — Z79899 Other long term (current) drug therapy: Secondary | ICD-10-CM | POA: Insufficient documentation

## 2021-07-14 DIAGNOSIS — F315 Bipolar disorder, current episode depressed, severe, with psychotic features: Secondary | ICD-10-CM | POA: Diagnosis not present

## 2021-07-14 DIAGNOSIS — F1994 Other psychoactive substance use, unspecified with psychoactive substance-induced mood disorder: Secondary | ICD-10-CM

## 2021-07-14 DIAGNOSIS — F411 Generalized anxiety disorder: Secondary | ICD-10-CM

## 2021-07-14 MED ORDER — TRAZODONE HCL 300 MG PO TABS: 300.0000 mg | ORAL_TABLET | Freq: Every evening | ORAL | 3 refills | Status: DC | PRN

## 2021-07-14 MED ORDER — LAMOTRIGINE 100 MG PO TABS: 100.0000 mg | ORAL_TABLET | Freq: Every day | ORAL | 3 refills | Status: DC

## 2021-07-14 MED ORDER — ARIPIPRAZOLE 15 MG PO TABS: 15.0000 mg | ORAL_TABLET | Freq: Every day | ORAL | 3 refills | Status: DC

## 2021-07-14 MED ORDER — HYDROXYZINE HCL 50 MG PO TABS: 50.0000 mg | ORAL_TABLET | Freq: Three times a day (TID) | ORAL | 3 refills | Status: DC | PRN

## 2021-07-14 MED ORDER — BUSPIRONE HCL 10 MG PO TABS: 10.0000 mg | ORAL_TABLET | Freq: Three times a day (TID) | ORAL | 3 refills | Status: DC
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
## 2021-07-14 NOTE — Progress Notes (Signed)
[name] was presented with his AVS, questions answered and he was escorted to the lobby without incident.

## 2021-07-14 NOTE — Discharge Instructions (Addendum)
[facility] - Admissions are currently completed Monday through Friday at [facility]; both appointments and walk-ins are accepted.  Any individual that is a [facility] resident may present for a substance abuse screening and assessment for admission.  A person may be referred by numerous sources or self-refer.   Potential clients will be screened for medical necessity and appropriateness for the program.  Clients must meet criteria for high-intensity residential treatment services.  If clinically appropriate, a client will continue with the comprehensive clinical assessment and intake process, as well as enrollment in the [facility].  Address: [address] [place], [place] [address] Admin Hours: Mon-Fri 8AM to [place] Hours: 24/7 Phone: [phone] Fax: [phone]  [facility] (Detox) Facility Based Crisis: These are 3 locations for services: Please call before arrival:    [facility]) Address: [address] [place], [place] [address] Phone: [phone]  [facility]) Address: [name] [place], [place] [address] Phone#: [phone]  [facility]) Address: [address] [place] [address], [place] [address] Phone#: [phone]   [facility] ([facility]): (offers outpatient therapy and intensive outpatient substance abuse therapy). [address] [place], [place] [address] Phone: [phone]  [facility] Address: [address] [place], [place] [address] Phone: [phone]  -[facility] provides food, shelter and other programs and services to the homeless men of [place] through our [employer] program.  By offering safe shelter, three meals a day, clean clothing, Biblical counseling, financial planning, vocational training, GED/education and employment assistance, we've  helped mend the shattered lives of many homeless men since opening in [place].  We have approximately 267 beds available, with a max of 312 beds including mats for emergency situations and currently house an average of 270 men a night.  Prospective Client Check-In Information Photo ID Required (State/ Out of State/ [facility]) - if photo ID is not available, clients are required to have a printout of a police/sheriff's criminal history report. Help out with chores around the [place]. No sex offender of any type (pending, charged, registered and/or any other sex related offenses) will be permitted to check in. Must be willing to abide by all rules, regulations, and policies established by the [employer]. The following will be provided - shelter, food, clothing, and biblical counseling. If you or someone you know is in need of assistance at our [facility] shelter in [place], [place], please call [phone] ext. [phone].  The [facility] SA Intensive Outpatient Program (SAIOP) means structured individual and group addiction activities and services that are provided at an outpatient program designed to assist adult and adolescent consumers to begin recovery and learn skills for recovery maintenance. The [facility] program is offered at least 3 hours a day, 3 days a week. SAIOP services shall include a structured program consisting of, but not limited to, the following services: Individual counseling and support; Group counseling and support; Family counseling, training or support; Biochemical assays to identify recent drug use (e.g., urine drug screens); Strategies for relapse prevention to include community and social support systems in treatment; Life skills; Crisis contingency planning; Disease Management; and Treatment support activities that have been adapted or specifically designed for persons with physical disabilities, or persons with co-occurring disorders of mental illness and  substance abuse/dependence or mental retardation/developmental disability and substance abuse/dependence.  Phone: [phone]  Addiction  [place])  Address: [place], [place], [place] [address] Phone: [phone]   [place] Address: [address] [place], [name] [address] Phone: [phone]  - a combination of group and individual sessions to meet the participants needs. This allows participants to engage in treatment and remain involved in their home and work life. - Transitional housing places program participants in a supportive living environment while they complete a treatment program and work to secure independent housing. - The Substance Abuse Intensive Outpatient Treatment Program at [employer] consists of structured group sessions and individual sessions that are designed to teach participants early recovery and relapse prevention skills. -[facility] works with the [employer] to provide a housing and treatment program for homeless veterans.   [facility] [employer].  Address: [address] [place], [place] [address] Phone#: [phone]  : Referrals to RTSA facilities can be made by [facility] and [facility].  Referrals are also accepted from physicians, private providers, hospital emergency rooms, family members, or any person who has knowledge of someone in the need of our services.  The [facility] will also offer the following outpatient services: (Monday through Friday 8am-5pm)   Partial Hospitalization Program (PHP) Substance Abuse Intensive Outpatient Program (SA-IOP) Group Therapy Medication Management Peer Living Room We also provide (24/7): Assessments: Our mental health clinician and providers will conduct a focused mental health evaluation, assessing for immediate safety concerns and further mental health needs. Referral: Our team will provide  resources and help connect to community based mental health treatment, when indicated, including psychotherapy, psychiatry, and other specialized behavioral health or substance use disorder services (for those not already in treatment). Transitional Care: Our team providers in person bridging and/or telephonic follow-up during the patient's transition to outpatient services.  The [facility] 24-Hour Call Center: [phone] Behavioral Health Crisis Line: [phone]

## 2021-07-14 NOTE — Progress Notes (Signed)
CSW add substance abuse resource to discharge instructions for follow-up per request of Thomes Lolling, NP.    Benjaman Kindler, MSW, [facility] 07/14/2021 5:45 PM

## 2021-07-15 ENCOUNTER — Telehealth (HOSPITAL_COMMUNITY): Payer: Self-pay | Admitting: *Deleted

## 2021-07-15 NOTE — Telephone Encounter (Signed)
[name] Mom came in with patient  @ 445 tearful stating that patient isn't begin Honest he has been drinking hand sanitizer & liquor alcohol, mixing med's just to get relief to sleep &  Patient admitted he has been doing that because he wants to kill himself. I immediately escorted patient downstairs to [facility] & informed that I would be letting your provider know.  Patient noted also that he hasn't told his provider any of this during their appt today 07/14/21

## 2021-07-23 ENCOUNTER — Encounter (HOSPITAL_COMMUNITY): Payer: Self-pay

## 2021-07-23 ENCOUNTER — Emergency Department (HOSPITAL_COMMUNITY)
Admission: EM | Admit: 2021-07-23 | Discharge: 2021-07-24 | Disposition: A | Discharge status: Nursing Home (Includes ICF) | Payer: Self-pay | Attending: Emergency Medicine | Admitting: Emergency Medicine

## 2021-07-23 ENCOUNTER — Other Ambulatory Visit: Payer: Self-pay

## 2021-07-23 DIAGNOSIS — Y908 Blood alcohol level of 240 mg/100 ml or more: Secondary | ICD-10-CM | POA: Insufficient documentation

## 2021-07-23 DIAGNOSIS — F332 Major depressive disorder, recurrent severe without psychotic features: Secondary | ICD-10-CM | POA: Insufficient documentation

## 2021-07-23 DIAGNOSIS — Z79899 Other long term (current) drug therapy: Secondary | ICD-10-CM | POA: Insufficient documentation

## 2021-07-23 DIAGNOSIS — X838XXA Intentional self-harm by other specified means, initial encounter: Secondary | ICD-10-CM | POA: Insufficient documentation

## 2021-07-23 DIAGNOSIS — F315 Bipolar disorder, current episode depressed, severe, with psychotic features: Secondary | ICD-10-CM | POA: Diagnosis present

## 2021-07-23 DIAGNOSIS — F1020 Alcohol dependence, uncomplicated: Secondary | ICD-10-CM | POA: Insufficient documentation

## 2021-07-23 DIAGNOSIS — F3164 Bipolar disorder, current episode mixed, severe, with psychotic features: Secondary | ICD-10-CM

## 2021-07-23 DIAGNOSIS — T5192XA Toxic effect of unspecified alcohol, intentional self-harm, initial encounter: Secondary | ICD-10-CM | POA: Insufficient documentation

## 2021-07-23 DIAGNOSIS — R45851 Suicidal ideations: Secondary | ICD-10-CM

## 2021-07-23 DIAGNOSIS — Z20822 Contact with and (suspected) exposure to covid-19: Secondary | ICD-10-CM | POA: Insufficient documentation

## 2021-07-23 DIAGNOSIS — T490X1A Poisoning by local antifungal, anti-infective and anti-inflammatory drugs, accidental (unintentional), initial encounter: Secondary | ICD-10-CM | POA: Diagnosis present

## 2021-07-23 LAB — COMPREHENSIVE METABOLIC PANEL
ALT: 42 U/L (ref 0–44)
AST: 39 U/L (ref 15–41)
Albumin: 4.1 g/dL (ref 3.5–5.0)
Alkaline Phosphatase: 51 U/L (ref 38–126)
Anion gap: 12 (ref 5–15)
BUN: 7 mg/dL (ref 6–20)
CO2: 22 mmol/L (ref 22–32)
Calcium: 8.9 mg/dL (ref 8.9–10.3)
Chloride: 104 mmol/L (ref 98–111)
Creatinine, Ser: 0.77 mg/dL (ref 0.61–1.24)
GFR, Estimated: >60 mL/min (ref >60)
Glucose, Bld: 82 mg/dL (ref 70–99)
Potassium: 3.4 mmol/L — ABNORMAL LOW (ref 3.5–5.1)
Sodium: 138 mmol/L (ref 135–145)
Total Bilirubin: 0.4 mg/dL (ref 0.3–1.2)
Total Protein: 7.4 g/dL (ref 6.5–8.1)

## 2021-07-23 LAB — CBC WITH DIFFERENTIAL/PLATELET
Abs Immature Granulocytes: 0.02 10*3/uL (ref 0.00–0.07)
Basophils Absolute: 0 10*3/uL (ref 0.0–0.1)
Basophils Relative: 1 %
Eosinophils Absolute: 0.1 10*3/uL (ref 0.0–0.5)
Eosinophils Relative: 1 %
HCT: 43.7 % (ref 39.0–52.0)
Hemoglobin: 15 g/dL (ref 13.0–17.0)
Immature Granulocytes: 0 %
Lymphocytes Relative: 48 %
Lymphs Abs: 3.3 10*3/uL (ref 0.7–4.0)
MCH: 29.8 pg (ref 26.0–34.0)
MCHC: 34.3 g/dL (ref 30.0–36.0)
MCV: 86.7 fL (ref 80.0–100.0)
Monocytes Absolute: 0.3 10*3/uL (ref 0.1–1.0)
Monocytes Relative: 5 %
Neutro Abs: 3 10*3/uL (ref 1.7–7.7)
Neutrophils Relative %: 45 %
Platelets: 202 10*3/uL (ref 150–400)
RBC: 5.04 MIL/uL (ref 4.22–5.81)
RDW: 13.7 % (ref 11.5–15.5)
WBC: 6.8 10*3/uL (ref 4.0–10.5)
nRBC: 0 % (ref 0.0–0.2)

## 2021-07-23 LAB — RAPID URINE DRUG SCREEN, HOSP PERFORMED
Amphetamines: POSITIVE — AB
Barbiturates: NOT DETECTED
Benzodiazepines: NOT DETECTED
Cocaine: POSITIVE — AB
Opiates: NOT DETECTED
Tetrahydrocannabinol: NOT DETECTED

## 2021-07-23 LAB — ACETAMINOPHEN LEVEL: Acetaminophen (Tylenol), Serum: <10 ug/mL — ABNORMAL LOW (ref 10–30)

## 2021-07-23 LAB — RESP PANEL BY RT-PCR (FLU A&B, COVID) ARPGX2
Influenza A by PCR: NEGATIVE
Influenza B by PCR: NEGATIVE
SARS Coronavirus 2 by RT PCR: NEGATIVE

## 2021-07-23 LAB — MAGNESIUM: Magnesium: 2 mg/dL (ref 1.7–2.4)

## 2021-07-23 LAB — ETHANOL: Alcohol, Ethyl (B): 295 mg/dL — ABNORMAL HIGH (ref <10)

## 2021-07-23 LAB — SALICYLATE LEVEL: Salicylate Lvl: <7 mg/dL — ABNORMAL LOW (ref 7.0–30.0)

## 2021-07-23 MED ORDER — LORAZEPAM 1 MG PO TABS: 0.0000 mg | ORAL_TABLET | Freq: Two times a day (BID) | ORAL | Status: DC

## 2021-07-23 MED ORDER — LORAZEPAM 2 MG/ML IJ SOLN: 0.0000 mg | Freq: Two times a day (BID) | INTRAMUSCULAR | Status: DC

## 2021-07-23 MED ORDER — GABAPENTIN 300 MG PO CAPS
300.0000 mg | ORAL_CAPSULE | Freq: Three times a day (TID) | ORAL | Status: DC
  Administered 2021-07-23: 300 mg via ORAL
  Filled 2021-07-23: qty 1

## 2021-07-23 MED ORDER — GABAPENTIN 300 MG PO CAPS
300.0000 mg | ORAL_CAPSULE | Freq: Two times a day (BID) | ORAL | Status: DC
  Administered 2021-07-24: 300 mg via ORAL
  Filled 2021-07-23 (×2): qty 1

## 2021-07-23 MED ORDER — BUSPIRONE HCL 10 MG PO TABS
10.0000 mg | ORAL_TABLET | Freq: Three times a day (TID) | ORAL | Status: DC
  Administered 2021-07-23 – 2021-07-24 (×3): 10 mg via ORAL
  Filled 2021-07-23 (×4): qty 1

## 2021-07-23 MED ORDER — GABAPENTIN 300 MG PO CAPS: 300.0000 mg | ORAL_CAPSULE | Freq: Every day | ORAL | Status: DC

## 2021-07-23 MED ORDER — PANTOPRAZOLE SODIUM 40 MG PO TBEC
40.0000 mg | DELAYED_RELEASE_TABLET | Freq: Every day | ORAL | Status: DC
  Administered 2021-07-23: 40 mg via ORAL
  Filled 2021-07-23: qty 1

## 2021-07-23 MED ORDER — ARIPIPRAZOLE 5 MG PO TABS
15.0000 mg | ORAL_TABLET | Freq: Every day | ORAL | Status: DC
  Administered 2021-07-23: 15 mg via ORAL
  Filled 2021-07-23 (×2): qty 1

## 2021-07-23 MED ORDER — ROSUVASTATIN CALCIUM 10 MG PO TABS
10.0000 mg | ORAL_TABLET | Freq: Every day | ORAL | Status: DC
  Administered 2021-07-23: 10 mg via ORAL
  Filled 2021-07-23 (×2): qty 1

## 2021-07-23 MED ORDER — IBUPROFEN 200 MG PO TABS: 400.0000 mg | ORAL_TABLET | Freq: Four times a day (QID) | ORAL | Status: DC | PRN

## 2021-07-23 MED ORDER — HYDROXYZINE HCL 25 MG PO TABS: 50.0000 mg | ORAL_TABLET | Freq: Three times a day (TID) | ORAL | Status: DC | PRN

## 2021-07-23 MED ORDER — AMLODIPINE BESYLATE 5 MG PO TABS
10.0000 mg | ORAL_TABLET | Freq: Every day | ORAL | Status: DC
  Filled 2021-07-23 (×2): qty 2

## 2021-07-23 MED ORDER — TRAZODONE HCL 100 MG PO TABS
300.0000 mg | ORAL_TABLET | Freq: Every evening | ORAL | Status: DC | PRN
  Administered 2021-07-23: 300 mg via ORAL
  Filled 2021-07-23: qty 3

## 2021-07-23 MED ORDER — POTASSIUM CHLORIDE CRYS ER 20 MEQ PO TBCR
20.0000 meq | EXTENDED_RELEASE_TABLET | Freq: Once | ORAL | Status: AC
  Administered 2021-07-23: 20 meq via ORAL
  Filled 2021-07-23: qty 1

## 2021-07-23 MED ORDER — SODIUM CHLORIDE 0.9 % IV BOLUS
1000.0000 mL | Freq: Once | INTRAVENOUS | Status: AC
  Administered 2021-07-23: 1000 mL via INTRAVENOUS

## 2021-07-23 MED ORDER — THIAMINE HCL 100 MG PO TABS
100.0000 mg | ORAL_TABLET | Freq: Every day | ORAL | Status: DC
  Administered 2021-07-23: 100 mg via ORAL
  Filled 2021-07-23 (×2): qty 1

## 2021-07-23 MED ORDER — LORAZEPAM 1 MG PO TABS
0.0000 mg | ORAL_TABLET | Freq: Four times a day (QID) | ORAL | Status: DC
  Administered 2021-07-23 (×2): 1 mg via ORAL
  Administered 2021-07-24 (×2): 2 mg via ORAL
  Filled 2021-07-23 (×2): qty 1
  Filled 2021-07-23 (×2): qty 2

## 2021-07-23 MED ORDER — LORAZEPAM 2 MG/ML IJ SOLN: 0.0000 mg | Freq: Four times a day (QID) | INTRAMUSCULAR | Status: DC

## 2021-07-23 MED ORDER — THIAMINE HCL 100 MG/ML IJ SOLN: 100.0000 mg | Freq: Every day | INTRAMUSCULAR | Status: DC

## 2021-07-23 MED ORDER — LAMOTRIGINE 100 MG PO TABS
100.0000 mg | ORAL_TABLET | Freq: Every day | ORAL | Status: DC
  Administered 2021-07-23: 100 mg via ORAL
  Filled 2021-07-23 (×2): qty 1

## 2021-07-23 MED ORDER — GABAPENTIN 300 MG PO CAPS
600.0000 mg | ORAL_CAPSULE | Freq: Every day | ORAL | Status: DC
  Administered 2021-07-23: 600 mg via ORAL
  Filled 2021-07-23: qty 2

## 2021-07-23 MED ORDER — ACETAMINOPHEN 325 MG PO TABS
650.0000 mg | ORAL_TABLET | Freq: Once | ORAL | Status: AC
  Administered 2021-07-23: 650 mg via ORAL
  Filled 2021-07-23: qty 2

## 2021-07-23 NOTE — ED Notes (Signed)
IV team currently with pt.

## 2021-07-23 NOTE — ED Notes (Signed)
I provided poison control with an update regarding pt status.

## 2021-07-23 NOTE — ED Notes (Signed)
Spoke with poison control (Patty) who recommends EKG and ordered labs. Observation for 4-6 hours. Cleared if labs and VS are normal
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
## 2021-07-23 NOTE — ED Provider Notes (Signed)
[facility] DEPT Provider Note   CSN: [id number] Arrival date & time: 07/23/21  4627     History  Chief Complaint  Patient presents with   Suicidal    [name] is a 41 y.o. male.  The history is provided by the patient, the EMS personnel and medical records.  [name] is a 41 y.o. male who presents to the Emergency Department complaining of suicide attempt.  He presents emergency department by EMS for evaluation after trying to harm himself.  He states he drank an unknown quantity of hand sanitizer in an attempt to kill himself.  He states he has been doing this for the last 2 weeks because he does not want to live anymore.  He cannot state why he has these feelings.  Level 5 caveat due to intoxication.    Home Medications Prior to Admission medications   Medication Sig Start Date End Date Taking? Authorizing Provider  amLODipine (NORVASC) 10 MG tablet Take 1 tablet (10 mg total) by mouth daily. 04/09/21   Maudie Mercury, MD  ARIPiprazole (ABILIFY) 15 MG tablet Take 1 tablet (15 mg total) by mouth daily. 07/14/21   Salley Slaughter, NP  busPIRone (BUSPAR) 10 MG tablet Take 1 tablet (10 mg total) by mouth 3 (three) times daily. 07/14/21   Salley Slaughter, NP  gabapentin (NEURONTIN) 300 MG capsule Take 1 capsule (300 mg total) by mouth 3 (three) times daily. 03/30/21 03/30/22  Maudie Mercury, MD  gabapentin (NEURONTIN) 300 MG capsule Take 1 capsule (300 mg total) by mouth at bedtime. 03/30/21   Maudie Mercury, MD  hydrOXYzine (ATARAX) 50 MG tablet Take 1 tablet (50 mg total) by mouth 3 (three) times daily as needed. 07/14/21   Salley Slaughter, NP  ibuprofen (ADVIL) 200 MG tablet Take 400 mg by mouth every 6 (six) hours as needed for headache.    [provider]  lamoTRIgine (LAMICTAL) 100 MG tablet Take 1 tablet (100 mg total) by mouth daily. 07/14/21   Salley Slaughter, NP  pantoprazole (PROTONIX) 40 MG tablet Take 1 tablet (40  mg total) by mouth daily. 01/13/21   Sherlon Handing, NP  rosuvastatin (CRESTOR) 10 MG tablet Take 1 tablet (10 mg total) by mouth daily. 04/09/21   Maudie Mercury, MD  trazodone (DESYREL) 300 MG tablet Take 1 tablet (300 mg total) by mouth at bedtime as needed for sleep (Insomnia). 07/14/21   Salley Slaughter, NP  DULoxetine (CYMBALTA) 30 MG capsule Take 1 capsule (30 mg total) by mouth daily. Patient not taking: No sig reported 10/27/20 01/07/21  Eulis Canner E, NP      Allergies    Codeine, Penicillins, Morphine, Coconut flavor, Coconut oil, Grapefruit concentrate, Morphine and related, Oxycodone, and Norco [hydrocodone-acetaminophen]    Review of Systems   Review of Systems  All other systems reviewed and are negative.  Physical Exam Updated Vital Signs BP 104/87 (BP Location: Left Arm)    Pulse 95    Temp 98.5 F (36.9 C) (Oral)    Resp 15    Ht 5\' 6"  (1.676 m)    Wt 69.4 kg    SpO2 95%    BMI 24.69 kg/m  Physical Exam Vitals and nursing note reviewed.  Constitutional:      Appearance: He is well-developed.     Comments: Appears intoxicated  HENT:     Head: Normocephalic and atraumatic.  Cardiovascular:     Rate and Rhythm: Normal rate and regular  rhythm.  Pulmonary:     Effort: Pulmonary effort is normal. No respiratory distress.  Abdominal:     Palpations: Abdomen is soft.     Tenderness: There is no abdominal tenderness. There is no guarding or rebound.  Musculoskeletal:        General: No tenderness.  Skin:    General: Skin is warm and dry.  Neurological:     Mental Status: He is alert and oriented to person, place, and time.  Psychiatric:     Comments: Reports active SI with plan to kill himself with hand sanitizer.    ED Results / Procedures / Treatments   Labs (all labs ordered are listed, but only abnormal results are displayed) Labs Reviewed  RESP PANEL BY RT-PCR (FLU A&B, COVID) [id number]  COMPREHENSIVE METABOLIC PANEL  ETHANOL  RAPID URINE DRUG  SCREEN, HOSP PERFORMED  CBC WITH DIFFERENTIAL/PLATELET  ACETAMINOPHEN LEVEL  SALICYLATE LEVEL    EKG None  Radiology No results found.  Procedures Procedures    Medications Ordered in ED Medications - No data to display  ED Course/ Medical Decision Making/ A&P                           Medical Decision Making  Patient with history of alcohol abuse, bipolar disorder here for evaluation of suicidal ideation and drinking hand sanitizer.  Patient is intoxicated appearing on evaluation but in no acute distress.  EKG does demonstrate QT prolongation.  His alcohol returned elevated.  IVC was completed for patient safety.  Poison control was contacted regarding his ingestion.  Recommendation for 4 to 6-hour observation.  Patient care transferred pending reassessment.        Final Clinical Impression(s) / ED Diagnoses Final diagnoses:  None    Rx / DC Orders ED Discharge Orders     None         Quintella Reichert, MD 07/23/21 [phone]

## 2021-07-23 NOTE — ED Triage Notes (Signed)
Patient arrived via [facility] after stating he drank an unknown amount of hand sanitizer in attempts to harm himself.

## 2021-07-23 NOTE — ED Notes (Signed)
X2 IV placement attempt without success in [facility].

## 2021-07-23 NOTE — ED Notes (Addendum)
Pt provided lunch tray. Pt expressed he is not hungry at this time. I provided the pt with some water.

## 2021-07-23 NOTE — ED Notes (Signed)
Per

## 2021-07-23 NOTE — Progress Notes (Signed)
Per Priscille Loveless, NP, patient is recommended for inpatient treatment. CSW sent [place], RN secure chat requesting to review pt for potential admission at [facility]. CSW will assist and follow for potential inpatient behavioral health bed offers   Benjaman Kindler, MSW, [facility] 07/23/2021 11:05 PM

## 2021-07-23 NOTE — ED Provider Notes (Signed)
Patient reassessed is not clinically sober to be seen by behavioral health   Lacretia Leigh, MD 07/23/21 1328

## 2021-07-23 NOTE — ED Notes (Addendum)
Per Tommi Rumps, pt is being recommended for inpatient treatment.

## 2021-07-24 ENCOUNTER — Telehealth (HOSPITAL_COMMUNITY): Payer: Self-pay | Admitting: Internal Medicine

## 2021-07-24 DIAGNOSIS — F332 Major depressive disorder, recurrent severe without psychotic features: Secondary | ICD-10-CM

## 2021-07-24 MED ORDER — ACETAMINOPHEN 325 MG PO TABS
650.0000 mg | ORAL_TABLET | Freq: Once | ORAL | Status: AC
Start: 2021-07-24 — End: 2021-07-24
  Administered 2021-07-24: 650 mg via ORAL
  Filled 2021-07-24: qty 2

## 2021-07-24 NOTE — Progress Notes (Signed)
Inpatient Behavioral Health Placement  Pt meets inpatient criteria per Priscille Loveless, NP. There are no appropriate beds at [facility] per [facility] Wynonia Hazard, RN.  Referral was sent to the following facilities;   Destination Service Provider Address Phone Fax  [address] [place] [address] [phone]  [facility]  [address] [place] [address] [phone] [place], [place] [address] [phone]  [facility]  [place], [place] [address] [phone]  [facility]  [address], [place] [address] [phone]  [facility]  [address] [place] [address] [phone]  [facility]  [place], [place] [address] [phone]  [facility]  [place] [place] [address] [phone] [facility]  [address], [place] [address] [phone]  [facility]  [address], [place] [address] [phone] [place], [place] [address] [phone] [facility]  [address] [place] [address] [phone]  [facility]  [address] [place] [address] [phone] [facility]  [address], [place] [address] [phone] [facility]  [place], [place] [address] [phone]  [facility]  [address],  [place] [address] [phone]  [facility]        Situation  ongoing,  CSW will follow up.   Benjaman Kindler, MSW, LCSWA 07/24/2021  @ 1:01 AM

## 2021-07-24 NOTE — ED Notes (Signed)
This nurse spoke with [place] with [facility] office. Per [employer], pt will not be transported til tomorrow at [facility] [facility], d/t staffing.

## 2021-07-24 NOTE — ED Notes (Signed)
Report given to Rogers Blocker, RN., at [facility].

## 2021-07-24 NOTE — ED Notes (Signed)
Sheriff called back and stated that they had a transport cancelled and they would be able to transport tonight.

## 2021-07-24 NOTE — ED Notes (Signed)
Patient given meal tray.

## 2021-07-24 NOTE — ED Notes (Signed)
Pt sitting upright eating lunch tray, which was declined by pt earlier, at this time.
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
## 2021-07-24 NOTE — ED Notes (Signed)
Patient calm, cooperative, resting in bed.

## 2021-07-24 NOTE — ED Notes (Signed)
Pt resting quietly with eyes closed. Normal rise and fall of chest. Sitter at the bedside with pt in direct line of sight. 1:1.

## 2021-07-24 NOTE — ED Provider Notes (Signed)
Emergency Medicine Observation Re-evaluation Note  [name] is a 41 y.o. male, seen on rounds today.  Pt initially presented to the ED for complaints of Suicidal Currently, the patient is calm and cooperative.  Physical Exam  BP 133/86 (BP Location: Right Arm)    Pulse 96    Temp 98.6 F (37 C) (Oral)    Resp 16    Ht 5\' 6"  (1.676 m)    Wt 69.4 kg    SpO2 98%    BMI 24.69 kg/m  Physical Exam General: No acute distress Cardiac: Regular chest rise Lungs: No respiratory distress Psych: Calm  ED Course / MDM  EKG:EKG Interpretation  Date/Time:  Thursday July 23 2021 06:24:32 EST Ventricular Rate:  89 PR Interval:  141 QRS Duration: 87 QT Interval:  486 QTC Calculation: 592 R Axis:   63 Text Interpretation: Sinus rhythm Borderline T abnormalities, diffuse leads Prolonged QT interval Confirmed by Quintella Reichert [phone]) on 07/23/2021 6:41:01 AM  I have reviewed the labs performed to date as well as medications administered while in observation.  Recent changes in the last 24 hours include : None.  Plan  Current plan is for to be managed by psychiatry team.  [name] is not under involuntary commitment.     Varney Biles, MD 07/24/21 1025

## 2021-07-24 NOTE — BH Assessment (Signed)
Care Management - [place] Follow Up Discharges   Writer attempted to make contact with patient today and was unsuccessful.  Phone just rang and not able to leave a voicemail message.   Per chart review, patient was will follow up with his established provider, Eulis Canner, NP o 09-30-20

## 2021-07-24 NOTE — ED Notes (Signed)
[place] [employer] called and voicemail left regarding transportation for pt to [place].

## 2021-07-24 NOTE — Progress Notes (Signed)
Pt was accepted to [facility] per Jenny Reichmann in Intake; Bed assignment 711-1.  Pt meets inpatient criteria per Dr. Verita Schneiders  Attending Physician will be Dr. Nira Retort   Report can be called to: [phone] or [phone]  Pt can arrive after- bed is available   Care Team notified via secure chat: Sharrell Ku, RN, Blanchie Dessert, MD, and Erven Colla. IVC paperwork to be faxed to [phone].  Nadara Mode, LCSWA 07/24/2021 @ 5:25 PM

## 2021-07-24 NOTE — ED Notes (Signed)
[facility] attempted to call back for report. This nurse able to take report at this time, however, this nurse was put back on hold. Will re-attempt report.

## 2021-07-24 NOTE — ED Notes (Signed)
Attempted to call report a second time with correct number. No answer. Will re-attempt.

## 2021-07-24 NOTE — ED Notes (Signed)
Attempted to call report to [facility], was provided the wrong number by Social Work. Will re-attempt.

## 2021-07-24 NOTE — ED Notes (Signed)
Attempted to call [facility] office for transport twice with no answer. Will re-attempt.

## 2021-08-05 ENCOUNTER — Ambulatory Visit (INDEPENDENT_AMBULATORY_CARE_PROVIDER_SITE_OTHER): Payer: Self-pay | Admitting: Internal Medicine

## 2021-08-05 VITALS — BP 107/75 | HR 96 | Temp 98.1°F | Wt 165.8 lb

## 2021-08-05 DIAGNOSIS — G8921 Chronic pain due to trauma: Secondary | ICD-10-CM

## 2021-08-05 DIAGNOSIS — F333 Major depressive disorder, recurrent, severe with psychotic symptoms: Secondary | ICD-10-CM

## 2021-08-05 DIAGNOSIS — E7800 Pure hypercholesterolemia, unspecified: Secondary | ICD-10-CM

## 2021-08-05 DIAGNOSIS — E785 Hyperlipidemia, unspecified: Secondary | ICD-10-CM

## 2021-08-05 DIAGNOSIS — I10 Essential (primary) hypertension: Secondary | ICD-10-CM

## 2021-08-05 MED ORDER — GABAPENTIN 600 MG PO TABS: 600.0000 mg | ORAL_TABLET | Freq: Three times a day (TID) | ORAL | 2 refills | Status: DC

## 2021-08-05 NOTE — Patient Instructions (Signed)
To [name],   It was a pleasure seeing you today! Today we discussed your recent admission and medications. I would reach out to your psychiatrist in regards to your Ativan. I will increase your dose of Gabapentin to 600 three times daily today. Please let our clinic know when you find out about your orange card status. Additionally, we are going to check your cholesterol today to ensure that your levels are stable. I will have you follow back in 4 months to follow with me.  Have a good day,  Maudie Mercury, MD

## 2021-08-07 LAB — LIPID PANEL
Chol/HDL Ratio: 3.3 ratio (ref 0.0–5.0)
Cholesterol, Total: 209 mg/dL — ABNORMAL HIGH (ref 100–199)
HDL: 64 mg/dL (ref >39)
LDL Chol Calc (NIH): 134 mg/dL — ABNORMAL HIGH (ref 0–99)
Triglycerides: 64 mg/dL (ref 0–149)
VLDL Cholesterol Cal: 11 mg/dL (ref 5–40)
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
## 2021-08-07 NOTE — Assessment & Plan Note (Addendum)
Patient presents with Hx of bipolar disorder, severe recurrent major depressive disorder with psychotic features, and prior suicide attempts presents to the clinic for follow up after having an inpatient psychiatric hospitalization. He states that he is doing better today, but requests ativan as he has been on edge and this medication helped during his recent hospital stay. No S/I or H/I today. We discussed that due to the complexity of his case, this decision would be need to be discussed with his psychiatrist which he agrees. He will have an appointment with his psychiatrist next week.  - Continue to follow with psychiatry - Continue Abilify 15 mg daily - Continue BuSpar 10 mg TID - Continue Trazodone 300 mg QHS - Continue Atarax 50 mg TID - Continue Lamotrigine 100 mg daily

## 2021-08-07 NOTE — Assessment & Plan Note (Signed)
Vitals with BMI 08/05/2021 07/24/2021 07/24/2021  Height - - -  Weight 165 lbs 13 oz - -  BMI 28.41 - -  Systolic [phone]  Diastolic 75 99 79  Pulse 96 86 92  Some encounter information is confidential and restricted. Go to Review Flowsheets activity to see all data.  Patient presents to the clinic for follow up after a [facility] stay. Vitals are stable today.  - Continue Amlodipine 10 mg daily

## 2021-08-07 NOTE — Assessment & Plan Note (Signed)
Lipid Panel     Component Value Date/Time   CHOL 209 (H) 08/05/2021 1159   TRIG 64 08/05/2021 1159   HDL 64 08/05/2021 1159   CHOLHDL 3.3 08/05/2021 1159   CHOLHDL 6.3 01/09/2021 0652   VLDL 21 01/09/2021 0652   LDLCALC 134 (H) 08/05/2021 1159   LABVLDL 11 08/05/2021 1159  Patient with Hx of Hyperlipidemia on Crestor 10 mg presents to the clinic after last lipid panel on 12/2020 showed a LDL of 250. Based on today's labwork he has an ASCVD risk of 6.6%, will continue Crestor 10 mg.  - Crestor 10 mg

## 2021-08-07 NOTE — Assessment & Plan Note (Signed)
Currently on Lamictal and gabapentin for seizure disorder and mood stabilization.  - Continue Lamictal 100 mg daily - Increased Gabapentin to 600 mg TID

## 2021-08-12 NOTE — Progress Notes (Signed)
Internal Medicine Clinic Attending ? ?Case discussed with Dr. Winters  At the time of the visit.  We reviewed the resident?s history and exam and pertinent patient test results.  I agree with the assessment, diagnosis, and plan of care documented in the resident?s note.  ?

## 2021-08-27 ENCOUNTER — Emergency Department (HOSPITAL_COMMUNITY): Payer: 59

## 2021-08-27 ENCOUNTER — Other Ambulatory Visit: Payer: Self-pay

## 2021-08-27 ENCOUNTER — Encounter (HOSPITAL_COMMUNITY): Payer: Self-pay | Admitting: *Deleted

## 2021-08-27 ENCOUNTER — Emergency Department (HOSPITAL_COMMUNITY)
Admission: EM | Admit: 2021-08-27 | Discharge: 2021-08-28 | Disposition: A | Discharge status: Home / Self Care | Payer: 59 | Attending: Emergency Medicine | Admitting: Emergency Medicine

## 2021-08-27 DIAGNOSIS — Z5321 Procedure and treatment not carried out due to patient leaving prior to being seen by health care provider: Secondary | ICD-10-CM | POA: Diagnosis not present

## 2021-08-27 DIAGNOSIS — R109 Unspecified abdominal pain: Secondary | ICD-10-CM | POA: Diagnosis not present

## 2021-08-27 DIAGNOSIS — R0602 Shortness of breath: Secondary | ICD-10-CM | POA: Diagnosis not present

## 2021-08-27 DIAGNOSIS — R110 Nausea: Secondary | ICD-10-CM | POA: Diagnosis not present

## 2021-08-27 DIAGNOSIS — L299 Pruritus, unspecified: Secondary | ICD-10-CM | POA: Diagnosis not present

## 2021-08-27 DIAGNOSIS — R55 Syncope and collapse: Secondary | ICD-10-CM | POA: Diagnosis present

## 2021-08-27 LAB — CBC WITH DIFFERENTIAL/PLATELET
Abs Immature Granulocytes: 0.01 10*3/uL (ref 0.00–0.07)
Basophils Absolute: 0.1 10*3/uL (ref 0.0–0.1)
Basophils Relative: 1 %
Eosinophils Absolute: 0.2 10*3/uL (ref 0.0–0.5)
Eosinophils Relative: 3 %
HCT: 44.2 % (ref 39.0–52.0)
Hemoglobin: 15.2 g/dL (ref 13.0–17.0)
Immature Granulocytes: 0 %
Lymphocytes Relative: 48 %
Lymphs Abs: 3 10*3/uL (ref 0.7–4.0)
MCH: 30 pg (ref 26.0–34.0)
MCHC: 34.4 g/dL (ref 30.0–36.0)
MCV: 87.4 fL (ref 80.0–100.0)
Monocytes Absolute: 0.6 10*3/uL (ref 0.1–1.0)
Monocytes Relative: 9 %
Neutro Abs: 2.4 10*3/uL (ref 1.7–7.7)
Neutrophils Relative %: 39 %
Platelets: 229 10*3/uL (ref 150–400)
RBC: 5.06 MIL/uL (ref 4.22–5.81)
RDW: 13.3 % (ref 11.5–15.5)
WBC: 6.2 10*3/uL (ref 4.0–10.5)
nRBC: 0 % (ref 0.0–0.2)

## 2021-08-27 NOTE — ED Provider Triage Note (Signed)
Emergency Medicine Provider Triage Evaluation Note  [name] , a 41 y.o. male  was evaluated in triage.  Pt complains of ultimate complaints including 3 syncopal episodes in the past week, breaking out, diffuse pruritus, abdominal pain associated with nausea without vomiting.  She endorses shortness of breath over the past 3 days.  Denies chest pain, fever, chills.  Review of Systems  Positive: As above Negative: As above  Physical Exam  BP 123/87 (BP Location: Left Arm)    Pulse (!) 128    Temp 98.3 F (36.8 C) (Oral)    Resp 16    Ht 5\' 6"  (1.676 m)    Wt 72.6 kg    SpO2 97%    BMI 25.82 kg/m  Gen:   Awake, no distress   Resp:  Normal effort  MSK:   Moves extremities without difficulty Other:  Diffuse abdominal tenderness on exam  Medical Decision Making  Medically screening exam initiated at 11:15 PM.  Appropriate orders placed.  [name] was informed that the remainder of the evaluation will be completed by another provider, this initial triage assessment does not replace that evaluation, and the importance of remaining in the ED until their evaluation is complete.     Evlyn Courier, PA-C 08/27/21 2318

## 2021-08-27 NOTE — ED Triage Notes (Signed)
Pt reports abdominal pain, vomiting, weakness. Itching all over.

## 2021-08-28 LAB — COMPREHENSIVE METABOLIC PANEL
ALT: 12 U/L (ref 0–44)
AST: 23 U/L (ref 15–41)
Albumin: 4.3 g/dL (ref 3.5–5.0)
Alkaline Phosphatase: 44 U/L (ref 38–126)
Anion gap: 11 (ref 5–15)
BUN: 12 mg/dL (ref 6–20)
CO2: 23 mmol/L (ref 22–32)
Calcium: 9.9 mg/dL (ref 8.9–10.3)
Chloride: 103 mmol/L (ref 98–111)
Creatinine, Ser: 0.89 mg/dL (ref 0.61–1.24)
GFR, Estimated: >60 mL/min (ref >60)
Glucose, Bld: 130 mg/dL — ABNORMAL HIGH (ref 70–99)
Potassium: 3.2 mmol/L — ABNORMAL LOW (ref 3.5–5.1)
Sodium: 137 mmol/L (ref 135–145)
Total Bilirubin: 0.2 mg/dL — ABNORMAL LOW (ref 0.3–1.2)
Total Protein: 7.8 g/dL (ref 6.5–8.1)

## 2021-08-28 LAB — LIPASE, BLOOD: Lipase: 32 U/L (ref 11–51)

## 2021-08-28 LAB — TROPONIN I (HIGH SENSITIVITY): Troponin I (High Sensitivity): 2 ng/L (ref <18)

## 2021-09-07 ENCOUNTER — Encounter (HOSPITAL_COMMUNITY): Payer: Self-pay | Admitting: *Deleted

## 2021-09-07 ENCOUNTER — Other Ambulatory Visit: Payer: Self-pay

## 2021-09-07 ENCOUNTER — Emergency Department (HOSPITAL_COMMUNITY)
Admission: EM | Admit: 2021-09-07 | Discharge: 2021-09-08 | Disposition: A | Discharge status: Home / Self Care | Payer: 59 | Attending: Emergency Medicine | Admitting: Emergency Medicine

## 2021-09-07 DIAGNOSIS — F1020 Alcohol dependence, uncomplicated: Secondary | ICD-10-CM | POA: Diagnosis not present

## 2021-09-07 DIAGNOSIS — I10 Essential (primary) hypertension: Secondary | ICD-10-CM | POA: Insufficient documentation

## 2021-09-07 DIAGNOSIS — T5192XA Toxic effect of unspecified alcohol, intentional self-harm, initial encounter: Secondary | ICD-10-CM | POA: Diagnosis not present

## 2021-09-07 DIAGNOSIS — R109 Unspecified abdominal pain: Secondary | ICD-10-CM | POA: Diagnosis not present

## 2021-09-07 DIAGNOSIS — Z20822 Contact with and (suspected) exposure to covid-19: Secondary | ICD-10-CM | POA: Diagnosis not present

## 2021-09-07 DIAGNOSIS — F1410 Cocaine abuse, uncomplicated: Secondary | ICD-10-CM

## 2021-09-07 DIAGNOSIS — F10920 Alcohol use, unspecified with intoxication, uncomplicated: Secondary | ICD-10-CM

## 2021-09-07 DIAGNOSIS — F332 Major depressive disorder, recurrent severe without psychotic features: Secondary | ICD-10-CM | POA: Diagnosis not present

## 2021-09-07 DIAGNOSIS — Y908 Blood alcohol level of 240 mg/100 ml or more: Secondary | ICD-10-CM | POA: Insufficient documentation

## 2021-09-07 DIAGNOSIS — T490X1A Poisoning by local antifungal, anti-infective and anti-inflammatory drugs, accidental (unintentional), initial encounter: Secondary | ICD-10-CM

## 2021-09-07 DIAGNOSIS — R45851 Suicidal ideations: Secondary | ICD-10-CM

## 2021-09-07 DIAGNOSIS — Z046 Encounter for general psychiatric examination, requested by authority: Secondary | ICD-10-CM | POA: Diagnosis present

## 2021-09-07 DIAGNOSIS — T43292A Poisoning by other antidepressants, intentional self-harm, initial encounter: Secondary | ICD-10-CM | POA: Insufficient documentation

## 2021-09-07 LAB — COMPREHENSIVE METABOLIC PANEL
ALT: 11 U/L (ref 0–44)
AST: 26 U/L (ref 15–41)
Albumin: 3.7 g/dL (ref 3.5–5.0)
Alkaline Phosphatase: 43 U/L (ref 38–126)
Anion gap: 7 (ref 5–15)
BUN: 11 mg/dL (ref 6–20)
CO2: 28 mmol/L (ref 22–32)
Calcium: 8.3 mg/dL — ABNORMAL LOW (ref 8.9–10.3)
Chloride: 105 mmol/L (ref 98–111)
Creatinine, Ser: 0.94 mg/dL (ref 0.61–1.24)
GFR, Estimated: >60 mL/min (ref >60)
Glucose, Bld: 114 mg/dL — ABNORMAL HIGH (ref 70–99)
Potassium: 3.4 mmol/L — ABNORMAL LOW (ref 3.5–5.1)
Sodium: 140 mmol/L (ref 135–145)
Total Bilirubin: 0.5 mg/dL (ref 0.3–1.2)
Total Protein: 6.9 g/dL (ref 6.5–8.1)

## 2021-09-07 LAB — URINALYSIS, ROUTINE W REFLEX MICROSCOPIC
Bilirubin Urine: NEGATIVE
Glucose, UA: NEGATIVE mg/dL
Hgb urine dipstick: NEGATIVE
Ketones, ur: NEGATIVE mg/dL
Leukocytes,Ua: NEGATIVE
Nitrite: NEGATIVE
Protein, ur: NEGATIVE mg/dL
Specific Gravity, Urine: 1.006 (ref 1.005–1.030)
pH: 7 (ref 5.0–8.0)

## 2021-09-07 LAB — CBC WITH DIFFERENTIAL/PLATELET
Abs Immature Granulocytes: 0.01 10*3/uL (ref 0.00–0.07)
Basophils Absolute: 0 10*3/uL (ref 0.0–0.1)
Basophils Relative: 1 %
Eosinophils Absolute: 0.1 10*3/uL (ref 0.0–0.5)
Eosinophils Relative: 2 %
HCT: 43.4 % (ref 39.0–52.0)
Hemoglobin: 14.7 g/dL (ref 13.0–17.0)
Immature Granulocytes: 0 %
Lymphocytes Relative: 58 %
Lymphs Abs: 2.7 10*3/uL (ref 0.7–4.0)
MCH: 29.8 pg (ref 26.0–34.0)
MCHC: 33.9 g/dL (ref 30.0–36.0)
MCV: 88 fL (ref 80.0–100.0)
Monocytes Absolute: 0.4 10*3/uL (ref 0.1–1.0)
Monocytes Relative: 8 %
Neutro Abs: 1.4 10*3/uL — ABNORMAL LOW (ref 1.7–7.7)
Neutrophils Relative %: 31 %
Platelets: 204 10*3/uL (ref 150–400)
RBC: 4.93 MIL/uL (ref 4.22–5.81)
RDW: 13.8 % (ref 11.5–15.5)
WBC: 4.6 10*3/uL (ref 4.0–10.5)
nRBC: 0 % (ref 0.0–0.2)

## 2021-09-07 LAB — RAPID URINE DRUG SCREEN, HOSP PERFORMED
Amphetamines: NOT DETECTED
Barbiturates: NOT DETECTED
Benzodiazepines: NOT DETECTED
Cocaine: POSITIVE — AB
Opiates: NOT DETECTED
Tetrahydrocannabinol: NOT DETECTED

## 2021-09-07 LAB — RESP PANEL BY RT-PCR (FLU A&B, COVID) ARPGX2
Influenza A by PCR: NEGATIVE
Influenza B by PCR: NEGATIVE
SARS Coronavirus 2 by RT PCR: NEGATIVE

## 2021-09-07 LAB — ETHANOL: Alcohol, Ethyl (B): 364 mg/dL (ref <10)

## 2021-09-07 LAB — SALICYLATE LEVEL: Salicylate Lvl: <7 mg/dL — ABNORMAL LOW (ref 7.0–30.0)

## 2021-09-07 LAB — MAGNESIUM: Magnesium: 1.9 mg/dL (ref 1.7–2.4)

## 2021-09-07 LAB — LIPASE, BLOOD: Lipase: 41 U/L (ref 11–51)

## 2021-09-07 LAB — ACETAMINOPHEN LEVEL: Acetaminophen (Tylenol), Serum: <10 ug/mL — ABNORMAL LOW (ref 10–30)

## 2021-09-07 MED ORDER — LACTATED RINGERS IV BOLUS: 1000.0000 mL | Freq: Once | INTRAVENOUS | Status: DC

## 2021-09-07 MED ORDER — DIPHENHYDRAMINE HCL 50 MG/ML IJ SOLN
25.0000 mg | Freq: Once | INTRAMUSCULAR | Status: AC
  Administered 2021-09-07: 25 mg via INTRAMUSCULAR
  Filled 2021-09-07: qty 1

## 2021-09-07 MED ORDER — HALOPERIDOL LACTATE 5 MG/ML IJ SOLN
5.0000 mg | Freq: Once | INTRAMUSCULAR | Status: AC
  Administered 2021-09-07: 5 mg via INTRAMUSCULAR
  Filled 2021-09-07: qty 1

## 2021-09-07 MED ORDER — ONDANSETRON HCL 4 MG/2ML IJ SOLN
4.0000 mg | Freq: Once | INTRAMUSCULAR | Status: DC
  Filled 2021-09-07: qty 2

## 2021-09-07 NOTE — ED Notes (Signed)
Tech went by pt and noticed he had a hand sanitizer bottle in his room and pt had it tech removed the bottle and wipes out of the room. Pt tired to run after security brought pt back to room.

## 2021-09-07 NOTE — ED Notes (Signed)
Patient came to nurses station and stated that he wanted to leave.  Patient stated that he was not suicidal, he "just wanted to get high."  MD made aware.
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
## 2021-09-07 NOTE — ED Notes (Signed)
Pt stated "that he is ready to leave and that he is only addicted to hand sanitizer and had no thoughts of SI". MD and RN aware.

## 2021-09-07 NOTE — ED Provider Notes (Signed)
11:53 PM Patient signed out to me by previous ED physician. Pt is a 40 yo male presenting for suicide attempt-10 trazadone over 24 hours ago. Alcohol intoxication.   Plan: Labs TTS  Physical Exam  BP 120/86    Pulse 98    Temp 98.7 F (37.1 C) (Oral)    Resp 18    Ht 5\' 6"  (1.676 m)    Wt 72.6 kg    SpO2 99%    BMI 25.82 kg/m   Physical Exam Vitals and nursing note reviewed.  Constitutional:      General: He is not in acute distress.    Appearance: He is well-developed.  HENT:     Head: Normocephalic and atraumatic.  Eyes:     Conjunctiva/sclera: Conjunctivae normal.  Cardiovascular:     Rate and Rhythm: Normal rate.  Pulmonary:     Effort: Pulmonary effort is normal. No respiratory distress.  Abdominal:     Palpations: Abdomen is soft.     Tenderness: There is no abdominal tenderness.  Musculoskeletal:        General: No swelling.  Skin:    General: Skin is warm and dry.     Capillary Refill: Capillary refill takes less than 2 seconds.  Neurological:     Mental Status: He is alert.  Psychiatric:        Mood and Affect: Mood normal.    Procedures  Procedures  ED Course / MDM    Medical Decision Making Amount and/or Complexity of Data Reviewed Labs: ordered.  Risk Prescription drug management.   UDS positive for cocaine Ethanol positive.  Stable labs. No anion gap. No alcoholic ketoacidosis.  6:22 AM Patient awake and alert asking for food. Clinically sober at this time and safe to be evaluated by TTS.         Campbell Stall P, DO 94/17/40 [phone]

## 2021-09-07 NOTE — ED Provider Notes (Signed)
[facility] DEPT Provider Note   CSN: [id number] Arrival date & time: 09/07/21  2122     History  Chief Complaint  Patient presents with   Suicidal    [name] is a 41 y.o. male.  HPI Patient presents for suicidal ideation.  Onset was yesterday.  He reports that last night, he attempted suicide by drinking alcohol, hand sanitizer, and overdosing on trazodone.  He estimates that he took 10 tablets of 100 mg trazodone.  These coingestions were at approximately the same time.  Patient subsequently went to sleep.  He reports that he has had abdominal pain, nausea, and vomiting throughout the day.  He denies ingestion of any alcohol or medications today.  He is prescribed other medications but denies any other overdoses last night.  He states that he has been taking his prescribed medications intermittently over the past several weeks.  Patient endorses increased psychosocial stressors as the trigger for his suicidal ideation but does not specify details.  He does continue to endorse suicidal ideation with intent.    Home Medications Prior to Admission medications   Medication Sig Start Date End Date Taking? Authorizing Provider  amLODipine (NORVASC) 10 MG tablet Take 1 tablet (10 mg total) by mouth daily. 04/09/21   Maudie Mercury, MD  ARIPiprazole (ABILIFY) 15 MG tablet Take 1 tablet (15 mg total) by mouth daily. 07/14/21   Salley Slaughter, NP  busPIRone (BUSPAR) 10 MG tablet Take 1 tablet (10 mg total) by mouth 3 (three) times daily. 07/14/21   Salley Slaughter, NP  gabapentin (NEURONTIN) 600 MG tablet Take 1 tablet (600 mg total) by mouth 3 (three) times daily. 08/05/21 11/03/21  Maudie Mercury, MD  hydrOXYzine (ATARAX) 50 MG tablet Take 1 tablet (50 mg total) by mouth 3 (three) times daily as needed. Patient taking differently: Take 50 mg by mouth 3 (three) times daily as needed (AS DIRECTED). 07/14/21   Salley Slaughter, NP  ibuprofen (ADVIL)  200 MG tablet Take 400 mg by mouth every 6 (six) hours as needed for headache.    [provider]  lamoTRIgine (LAMICTAL) 100 MG tablet Take 1 tablet (100 mg total) by mouth daily. Patient not taking: Reported on 07/23/2021 07/14/21   Salley Slaughter, NP  pantoprazole (PROTONIX) 40 MG tablet Take 1 tablet (40 mg total) by mouth daily. Patient not taking: Reported on 07/23/2021 01/13/21   Sherlon Handing, NP  rosuvastatin (CRESTOR) 10 MG tablet Take 1 tablet (10 mg total) by mouth daily. Patient taking differently: Take 10 mg by mouth at bedtime. 04/09/21   Maudie Mercury, MD  traZODone (DESYREL) 100 MG tablet Take 300 mg by mouth at bedtime as needed for sleep.    [provider]  trazodone (DESYREL) 300 MG tablet Take 1 tablet (300 mg total) by mouth at bedtime as needed for sleep (Insomnia). Patient not taking: Reported on 07/23/2021 07/14/21   Salley Slaughter, NP  DULoxetine (CYMBALTA) 30 MG capsule Take 1 capsule (30 mg total) by mouth daily. Patient not taking: No sig reported 10/27/20 01/07/21  Eulis Canner E, NP      Allergies    Codeine, Penicillins, Morphine, Coconut flavor, Coconut oil, Grapefruit concentrate, Morphine and related, Oxycodone, and Norco [hydrocodone-acetaminophen]    Review of Systems   Review of Systems  Gastrointestinal:  Positive for abdominal pain, nausea and vomiting.  Psychiatric/Behavioral:  Positive for dysphoric mood, self-injury and suicidal ideas.    Physical Exam Updated Vital Signs  BP 122/90    Pulse 100    Temp 98.7 F (37.1 C) (Oral)    Resp 16    Ht 5\' 6"  (1.676 m)    Wt 72.6 kg    SpO2 100%    BMI 25.82 kg/m  Physical Exam Vitals and nursing note reviewed.  Constitutional:      General: He is not in acute distress.    Appearance: Normal appearance. He is well-developed. He is not ill-appearing, toxic-appearing or diaphoretic.  HENT:     Head: Normocephalic and atraumatic.     Right Ear: External ear normal.      Left Ear: External ear normal.     Nose: Nose normal.     Mouth/Throat:     Mouth: Mucous membranes are moist.     Pharynx: Oropharynx is clear.  Eyes:     Extraocular Movements: Extraocular movements intact.     Conjunctiva/sclera: Conjunctivae normal.  Cardiovascular:     Rate and Rhythm: Normal rate and regular rhythm.     Heart sounds: No murmur heard. Pulmonary:     Effort: Pulmonary effort is normal. No respiratory distress.  Abdominal:     Palpations: Abdomen is soft.     Tenderness: There is no abdominal tenderness.  Musculoskeletal:        General: No swelling. Normal range of motion.     Cervical back: Normal range of motion and neck supple.     Right lower leg: No edema.     Left lower leg: No edema.  Skin:    General: Skin is warm and dry.     Capillary Refill: Capillary refill takes less than 2 seconds.  Neurological:     General: No focal deficit present.     Mental Status: He is alert and oriented to person, place, and time.     Cranial Nerves: No cranial nerve deficit.     Sensory: No sensory deficit.     Motor: No weakness.     Coordination: Coordination normal.  Psychiatric:        Attention and Perception: Attention and perception normal.        Mood and Affect: Mood normal. Affect is labile.        Speech: Speech is slurred.        Behavior: Behavior is uncooperative, agitated and aggressive.        Thought Content: Thought content includes suicidal ideation. Thought content includes suicidal plan.    ED Results / Procedures / Treatments   Labs (all labs ordered are listed, but only abnormal results are displayed) Labs Reviewed  COMPREHENSIVE METABOLIC PANEL - Abnormal; Notable for the following components:      Result Value   Potassium 3.4 (*)    Glucose, Bld 114 (*)    Calcium 8.3 (*)    All other components within normal limits  ETHANOL - Abnormal; Notable for the following components:   Alcohol, Ethyl (B) 364 (*)    All other components  within normal limits  RAPID URINE DRUG SCREEN, HOSP PERFORMED - Abnormal; Notable for the following components:   Cocaine POSITIVE (*)    All other components within normal limits  CBC WITH DIFFERENTIAL/PLATELET - Abnormal; Notable for the following components:   Neutro Abs 1.4 (*)    All other components within normal limits  SALICYLATE LEVEL - Abnormal; Notable for the following components:   Salicylate Lvl <4.8 (*)    All other components within normal limits  ACETAMINOPHEN LEVEL -  Abnormal; Notable for the following components:   Acetaminophen (Tylenol), Serum <10 (*)    All other components within normal limits  URINALYSIS, ROUTINE W REFLEX MICROSCOPIC - Abnormal; Notable for the following components:   Color, Urine STRAW (*)    All other components within normal limits  RESP PANEL BY RT-PCR (FLU A&B, COVID) ARPGX2  MAGNESIUM  LIPASE, BLOOD    EKG EKG Interpretation  Date/Time:  Monday September 07 2021 21:38:48 EST Ventricular Rate:  87 PR Interval:  150 QRS Duration: 84 QT Interval:  380 QTC Calculation: 458 R Axis:     Text Interpretation: EASI Derived Leads Confirmed by Godfrey Pick [phone]) on 09/07/2021 11:23:07 PM  Radiology No results found.  Procedures Procedures    Medications Ordered in ED Medications  lactated ringers bolus 1,000 mL (1,000 mLs Intravenous Patient Refused/Not Given 09/07/21 2336)  ondansetron (ZOFRAN) injection 4 mg (4 mg Intravenous Patient Refused/Not Given 09/07/21 2336)  haloperidol lactate (HALDOL) injection 5 mg (5 mg Intramuscular Given 09/07/21 2334)  diphenhydrAMINE (BENADRYL) injection 25 mg (25 mg Intramuscular Given 09/07/21 2334)    ED Course/ Medical Decision Making/ A&P                           Medical Decision Making Amount and/or Complexity of Data Reviewed Labs: ordered.  Risk Prescription drug management.   Patient is a 41 year old male with history of HLD, HTN, seizure disorder, alcohol abuse, polysubstance abuse,  Tylenol overdose, major depressive disorder, previous SI, previous suicide attempt, generalized anxiety disorder, bipolar affective disorder with psychotic behavior, and previous hand sanitizer poisoning, presents to the ED for what he describes as a suicide attempt by drinking hand sanitizer and ingesting trazodone.  He reports that this occurred last night.  He states that he was also drinking alcohol at that time.  His vital signs are normal on arrival.  Given his reported 24 hours since ingestion, poison control was not contacted.  Patient to undergo laboratory work-up for medical clearance.  Although patient denies any alcohol use today, his ethanol level is 364. While in the ED, awaiting medical clearance, patient recanted his suicidal ideation.  Given his current ethanol level and slurred speech, patient is acutely intoxicated and does not have capacity to make medical decisions.  Additionally, patient stated that his actions last night were a suicide attempt.  He has a long history of psychiatric illness including previous suicide attempts.  At this time, patient is deemed to be a threat of harm to himself.  Given his desire to leave, patient was IVC'd.  He was informed that IVC paperwork has been placed and that he is not permitted to leave.  At this point, he walked out of his room and was apprehended by security.  Soft restraints were placed.  Patient was given Haldol and Benadryl.  At time of signout, results of remaining lab work were pending.  Care of patient was signed out to oncoming ED provider.  CRITICAL CARE Performed by: Godfrey Pick   Total critical care time: 35 minutes  Critical care time was exclusive of separately billable procedures and treating other patients.  Critical care was necessary to treat or prevent imminent or life-threatening deterioration.  Critical care was time spent personally by me on the following activities: development of treatment plan with patient and/or  surrogate as well as nursing, discussions with consultants, evaluation of patient's response to treatment, examination of patient, obtaining history from patient or surrogate,  ordering and performing treatments and interventions, ordering and review of laboratory studies, ordering and review of radiographic studies, pulse oximetry and re-evaluation of patient's condition.         Final Clinical Impression(s) / ED Diagnoses Final diagnoses:  Suicidal ideation  Suicide attempt [facility])  Alcoholic intoxication without complication [facility])    Rx / DC Orders ED Discharge Orders     None         Godfrey Pick, MD 09/08/21 [phone]

## 2021-09-07 NOTE — ED Notes (Signed)
Pt admits to drinking a gallon of vodka today

## 2021-09-07 NOTE — ED Triage Notes (Signed)
Pt states he  feels suicidal x 2 days; pt states he took an unknown amount of trazadone to harm himself; pt alert and oriented during triage

## 2021-09-08 NOTE — BH Assessment (Signed)
@  0824, requested [place], RN to move the TTS cart to patient's room for Clinician to complete his initial TTS assessment.

## 2021-09-08 NOTE — ED Notes (Signed)
Mom called as patient reports that she is his legal guardian and informed of pending discharge

## 2021-09-08 NOTE — ED Notes (Signed)
Pt back in bed with sitter at this bedside

## 2021-09-08 NOTE — Discharge Instructions (Signed)
For your behavioral health needs you are advised to follow up with [facility] at your earliest opportunity:      [facility]      [place], [place] [address]      [phone]      They offer psychiatry/medication management, therapy and substance use disorder treatment.  New patients are seen in their walk-in clinic.  Walk-in hours are Monday - Thursday from 8:00 am - 11:00 am for psychiatry, and Friday from 1:00 pm - 4:00 pm for therapy.  Walk-in patients are seen on a first come, first served basis, so try to arrive as early as possible for the best chance of being seen the same day.  Please note that to be eligible for services you must bring an ID or a piece of mail with your name and a [facility] address.
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
## 2021-09-08 NOTE — BH Assessment (Signed)
[place] Assessment Progress Note   Per Sheran Fava, NP , this pt does not require psychiatric hospitalization at this time.  Pt presents under IVC initiated by EDP Godfrey Pick, MD which has been rescinded by Hampton Abbot, MD.  Pt is psychiatrically cleared.  Discharge instructions advise pt to continue treatment at [facility].  EDP Pattricia Boss, MD and pt's nurse, Lisette Grinder, have been notified.  Jalene Mullet, [place] Triage Specialist [phone]

## 2021-09-08 NOTE — ED Notes (Signed)
Pt still lying on floor with pillow and blanket, nad noted; sitter at bedside

## 2021-09-08 NOTE — ED Notes (Signed)
Vitals will be taken when pt wakes up due to him sleeping. RN notified

## 2021-09-08 NOTE — ED Notes (Signed)
Pt does not want lunch tray.

## 2021-09-08 NOTE — ED Notes (Signed)
Pt lying on floor with blanket and pillow

## 2021-09-08 NOTE — ED Notes (Signed)
Pt brought back to TCU, belongings located in locker #31

## 2021-09-08 NOTE — ED Notes (Signed)
Pt still lying in floor, no acute distress noted at this time

## 2021-09-12 ENCOUNTER — Emergency Department (HOSPITAL_COMMUNITY)
Admission: EM | Admit: 2021-09-12 | Discharge: 2021-09-13 | Disposition: A | Discharge status: Other Acute Inpatient Hospital | Payer: 59 | Attending: Emergency Medicine | Admitting: Emergency Medicine

## 2021-09-12 ENCOUNTER — Other Ambulatory Visit: Payer: Self-pay

## 2021-09-12 DIAGNOSIS — Z20822 Contact with and (suspected) exposure to covid-19: Secondary | ICD-10-CM | POA: Diagnosis not present

## 2021-09-12 DIAGNOSIS — Y908 Blood alcohol level of 240 mg/100 ml or more: Secondary | ICD-10-CM | POA: Insufficient documentation

## 2021-09-12 DIAGNOSIS — X838XXA Intentional self-harm by other specified means, initial encounter: Secondary | ICD-10-CM | POA: Diagnosis not present

## 2021-09-12 DIAGNOSIS — F332 Major depressive disorder, recurrent severe without psychotic features: Secondary | ICD-10-CM | POA: Diagnosis not present

## 2021-09-12 DIAGNOSIS — T426X2A Poisoning by other antiepileptic and sedative-hypnotic drugs, intentional self-harm, initial encounter: Secondary | ICD-10-CM | POA: Diagnosis not present

## 2021-09-12 DIAGNOSIS — F1090 Alcohol use, unspecified, uncomplicated: Secondary | ICD-10-CM | POA: Diagnosis present

## 2021-09-12 DIAGNOSIS — F1020 Alcohol dependence, uncomplicated: Secondary | ICD-10-CM | POA: Diagnosis not present

## 2021-09-12 LAB — COMPREHENSIVE METABOLIC PANEL
ALT: 17 U/L (ref 0–44)
AST: 39 U/L (ref 15–41)
Albumin: 4.1 g/dL (ref 3.5–5.0)
Alkaline Phosphatase: 59 U/L (ref 38–126)
Anion gap: 13 (ref 5–15)
BUN: 9 mg/dL (ref 6–20)
CO2: 22 mmol/L (ref 22–32)
Calcium: 8.3 mg/dL — ABNORMAL LOW (ref 8.9–10.3)
Chloride: 102 mmol/L (ref 98–111)
Creatinine, Ser: 0.81 mg/dL (ref 0.61–1.24)
GFR, Estimated: >60 mL/min (ref >60)
Glucose, Bld: 78 mg/dL (ref 70–99)
Potassium: 4.4 mmol/L (ref 3.5–5.1)
Sodium: 137 mmol/L (ref 135–145)
Total Bilirubin: 0.6 mg/dL (ref 0.3–1.2)
Total Protein: 7.2 g/dL (ref 6.5–8.1)

## 2021-09-12 LAB — CBC WITH DIFFERENTIAL/PLATELET
Abs Immature Granulocytes: 0.01 10*3/uL (ref 0.00–0.07)
Basophils Absolute: 0 10*3/uL (ref 0.0–0.1)
Basophils Relative: 1 %
Eosinophils Absolute: 0.1 10*3/uL (ref 0.0–0.5)
Eosinophils Relative: 1 %
HCT: 45.7 % (ref 39.0–52.0)
Hemoglobin: 15.8 g/dL (ref 13.0–17.0)
Immature Granulocytes: 0 %
Lymphocytes Relative: 48 %
Lymphs Abs: 2.8 10*3/uL (ref 0.7–4.0)
MCH: 29.6 pg (ref 26.0–34.0)
MCHC: 34.6 g/dL (ref 30.0–36.0)
MCV: 85.7 fL (ref 80.0–100.0)
Monocytes Absolute: 0.5 10*3/uL (ref 0.1–1.0)
Monocytes Relative: 8 %
Neutro Abs: 2.5 10*3/uL (ref 1.7–7.7)
Neutrophils Relative %: 42 %
Platelets: 221 10*3/uL (ref 150–400)
RBC: 5.33 MIL/uL (ref 4.22–5.81)
RDW: 13.5 % (ref 11.5–15.5)
WBC: 5.9 10*3/uL (ref 4.0–10.5)
nRBC: 0 % (ref 0.0–0.2)

## 2021-09-12 LAB — ETHANOL: Alcohol, Ethyl (B): 370 mg/dL (ref <10)

## 2021-09-12 LAB — ACETAMINOPHEN LEVEL: Acetaminophen (Tylenol), Serum: <10 ug/mL — ABNORMAL LOW (ref 10–30)

## 2021-09-12 MED ORDER — THIAMINE HCL 100 MG/ML IJ SOLN: 100.0000 mg | Freq: Every day | INTRAMUSCULAR | Status: DC

## 2021-09-12 MED ORDER — LORAZEPAM 2 MG/ML IJ SOLN: 0.0000 mg | Freq: Four times a day (QID) | INTRAMUSCULAR | Status: DC

## 2021-09-12 MED ORDER — LORAZEPAM 1 MG PO TABS: 0.0000 mg | ORAL_TABLET | Freq: Two times a day (BID) | ORAL | Status: DC

## 2021-09-12 MED ORDER — THIAMINE HCL 100 MG PO TABS
100.0000 mg | ORAL_TABLET | Freq: Every day | ORAL | Status: DC
  Administered 2021-09-13: 100 mg via ORAL
  Filled 2021-09-12: qty 1

## 2021-09-12 MED ORDER — LORAZEPAM 1 MG PO TABS
0.0000 mg | ORAL_TABLET | Freq: Four times a day (QID) | ORAL | Status: DC
  Administered 2021-09-13 (×2): 2 mg via ORAL
  Filled 2021-09-12 (×2): qty 2

## 2021-09-12 MED ORDER — LORAZEPAM 2 MG/ML IJ SOLN: 0.0000 mg | Freq: Two times a day (BID) | INTRAMUSCULAR | Status: DC

## 2021-09-12 NOTE — ED Triage Notes (Signed)
Pt here from home for suicide attempt after taking 6 neurontin, 600 mg trazadone, 1 bottle of wine and "some sanitizer".  History is unverified - pt reported.  bp 130/90 Hr 100 O2 99% RA Rr 20  Cbg 95%
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
## 2021-09-12 NOTE — ED Provider Notes (Addendum)
When I went to evaluate the patient, TTS was already on doing their virtual assessment.    Per poison control- Patient should have a repeat EKG in 4 hours and monitored for 6 due to risk for QT prolongation. Also should be given IVF.      Darliss Ridgel 09/12/21 2246    Merrily Pew, MD 09/13/21 [phone]

## 2021-09-12 NOTE — ED Notes (Addendum)
[employer] and spoke with Patty.  She stated pt needed fluids, reassess EKG in 4 hours and  monitor pt mentation, lethargy and respiratory status for at least 6 hours.  PA is aware.

## 2021-09-13 ENCOUNTER — Encounter (HOSPITAL_COMMUNITY): Payer: Self-pay | Admitting: Psychiatry

## 2021-09-13 ENCOUNTER — Inpatient Hospital Stay (HOSPITAL_COMMUNITY)
Admission: AD | Admit: 2021-09-13 | Discharge: 2021-09-21 | DRG: 885 | Disposition: A | Discharge status: Home / Self Care | Payer: 59 | Source: Intra-hospital | Attending: Emergency Medicine | Admitting: Emergency Medicine

## 2021-09-13 ENCOUNTER — Encounter (HOSPITAL_COMMUNITY): Payer: Self-pay

## 2021-09-13 DIAGNOSIS — K7460 Unspecified cirrhosis of liver: Secondary | ICD-10-CM | POA: Diagnosis present

## 2021-09-13 DIAGNOSIS — F1420 Cocaine dependence, uncomplicated: Secondary | ICD-10-CM | POA: Diagnosis present

## 2021-09-13 DIAGNOSIS — Z823 Family history of stroke: Secondary | ICD-10-CM | POA: Diagnosis not present

## 2021-09-13 DIAGNOSIS — G40909 Epilepsy, unspecified, not intractable, without status epilepticus: Secondary | ICD-10-CM | POA: Diagnosis present

## 2021-09-13 DIAGNOSIS — F4310 Post-traumatic stress disorder, unspecified: Secondary | ICD-10-CM | POA: Diagnosis present

## 2021-09-13 DIAGNOSIS — Z85528 Personal history of other malignant neoplasm of kidney: Secondary | ICD-10-CM | POA: Diagnosis not present

## 2021-09-13 DIAGNOSIS — I739 Peripheral vascular disease, unspecified: Secondary | ICD-10-CM | POA: Diagnosis present

## 2021-09-13 DIAGNOSIS — Z79899 Other long term (current) drug therapy: Secondary | ICD-10-CM

## 2021-09-13 DIAGNOSIS — F17210 Nicotine dependence, cigarettes, uncomplicated: Secondary | ICD-10-CM | POA: Diagnosis present

## 2021-09-13 DIAGNOSIS — F1020 Alcohol dependence, uncomplicated: Secondary | ICD-10-CM | POA: Diagnosis not present

## 2021-09-13 DIAGNOSIS — Z853 Personal history of malignant neoplasm of breast: Secondary | ICD-10-CM

## 2021-09-13 DIAGNOSIS — Z8616 Personal history of COVID-19: Secondary | ICD-10-CM

## 2021-09-13 DIAGNOSIS — G4700 Insomnia, unspecified: Secondary | ICD-10-CM | POA: Diagnosis present

## 2021-09-13 DIAGNOSIS — Z8249 Family history of ischemic heart disease and other diseases of the circulatory system: Secondary | ICD-10-CM

## 2021-09-13 DIAGNOSIS — R0789 Other chest pain: Secondary | ICD-10-CM

## 2021-09-13 DIAGNOSIS — E7800 Pure hypercholesterolemia, unspecified: Secondary | ICD-10-CM | POA: Diagnosis present

## 2021-09-13 DIAGNOSIS — I10 Essential (primary) hypertension: Secondary | ICD-10-CM | POA: Diagnosis present

## 2021-09-13 DIAGNOSIS — F332 Major depressive disorder, recurrent severe without psychotic features: Secondary | ICD-10-CM | POA: Diagnosis present

## 2021-09-13 DIAGNOSIS — Z9013 Acquired absence of bilateral breasts and nipples: Secondary | ICD-10-CM | POA: Diagnosis not present

## 2021-09-13 DIAGNOSIS — Z62810 Personal history of physical and sexual abuse in childhood: Secondary | ICD-10-CM | POA: Diagnosis present

## 2021-09-13 DIAGNOSIS — Z905 Acquired absence of kidney: Secondary | ICD-10-CM

## 2021-09-13 DIAGNOSIS — F17200 Nicotine dependence, unspecified, uncomplicated: Secondary | ICD-10-CM | POA: Diagnosis present

## 2021-09-13 DIAGNOSIS — E519 Thiamine deficiency, unspecified: Secondary | ICD-10-CM | POA: Diagnosis present

## 2021-09-13 DIAGNOSIS — Z818 Family history of other mental and behavioral disorders: Secondary | ICD-10-CM | POA: Diagnosis not present

## 2021-09-13 DIAGNOSIS — I2510 Atherosclerotic heart disease of native coronary artery without angina pectoris: Secondary | ICD-10-CM | POA: Diagnosis present

## 2021-09-13 DIAGNOSIS — F14288 Cocaine dependence with other cocaine-induced disorder: Secondary | ICD-10-CM | POA: Diagnosis present

## 2021-09-13 DIAGNOSIS — Z9151 Personal history of suicidal behavior: Secondary | ICD-10-CM | POA: Diagnosis not present

## 2021-09-13 DIAGNOSIS — F1910 Other psychoactive substance abuse, uncomplicated: Secondary | ICD-10-CM | POA: Diagnosis present

## 2021-09-13 DIAGNOSIS — F3164 Bipolar disorder, current episode mixed, severe, with psychotic features: Secondary | ICD-10-CM | POA: Diagnosis present

## 2021-09-13 DIAGNOSIS — F411 Generalized anxiety disorder: Secondary | ICD-10-CM | POA: Diagnosis present

## 2021-09-13 DIAGNOSIS — F315 Bipolar disorder, current episode depressed, severe, with psychotic features: Secondary | ICD-10-CM

## 2021-09-13 DIAGNOSIS — F10239 Alcohol dependence with withdrawal, unspecified: Secondary | ICD-10-CM | POA: Diagnosis present

## 2021-09-13 DIAGNOSIS — F410 Panic disorder [episodic paroxysmal anxiety] without agoraphobia: Secondary | ICD-10-CM | POA: Diagnosis present

## 2021-09-13 DIAGNOSIS — K219 Gastro-esophageal reflux disease without esophagitis: Secondary | ICD-10-CM | POA: Diagnosis present

## 2021-09-13 LAB — RESP PANEL BY RT-PCR (FLU A&B, COVID) ARPGX2
Influenza A by PCR: NEGATIVE
Influenza B by PCR: NEGATIVE
SARS Coronavirus 2 by RT PCR: NEGATIVE

## 2021-09-13 LAB — RAPID URINE DRUG SCREEN, HOSP PERFORMED
Amphetamines: NOT DETECTED
Barbiturates: NOT DETECTED
Benzodiazepines: NOT DETECTED
Cocaine: POSITIVE — AB
Opiates: NOT DETECTED
Tetrahydrocannabinol: NOT DETECTED

## 2021-09-13 MED ORDER — ROSUVASTATIN CALCIUM 5 MG PO TABS: 10.0000 mg | ORAL_TABLET | Freq: Every day | ORAL | Status: DC

## 2021-09-13 MED ORDER — PANTOPRAZOLE SODIUM 40 MG PO TBEC: 40.0000 mg | DELAYED_RELEASE_TABLET | Freq: Every day | ORAL | Status: DC

## 2021-09-13 MED ORDER — ONDANSETRON HCL 4 MG PO TABS: 4.0000 mg | ORAL_TABLET | Freq: Three times a day (TID) | ORAL | Status: DC | PRN

## 2021-09-13 MED ORDER — LORAZEPAM 1 MG PO TABS
1.0000 mg | ORAL_TABLET | Freq: Two times a day (BID) | ORAL | Status: AC
  Administered 2021-09-16 (×2): 1 mg via ORAL
  Filled 2021-09-13 (×2): qty 1

## 2021-09-13 MED ORDER — TRAZODONE HCL 50 MG PO TABS
50.0000 mg | ORAL_TABLET | Freq: Every evening | ORAL | Status: DC | PRN
  Filled 2021-09-13: qty 1

## 2021-09-13 MED ORDER — ACETAMINOPHEN 325 MG PO TABS
650.0000 mg | ORAL_TABLET | ORAL | Status: DC | PRN
  Administered 2021-09-13: 650 mg via ORAL
  Filled 2021-09-13 (×2): qty 2

## 2021-09-13 MED ORDER — LORAZEPAM 1 MG PO TABS: 1.0000 mg | ORAL_TABLET | Freq: Four times a day (QID) | ORAL | Status: AC | PRN

## 2021-09-13 MED ORDER — ADULT MULTIVITAMIN W/MINERALS CH
1.0000 | ORAL_TABLET | Freq: Every day | ORAL | Status: DC
  Administered 2021-09-14 – 2021-09-21 (×8): 1 via ORAL
  Filled 2021-09-13 (×3): qty 1
  Filled 2021-09-13: qty 14
  Filled 2021-09-13 (×6): qty 1

## 2021-09-13 MED ORDER — ALUM & MAG HYDROXIDE-SIMETH 200-200-20 MG/5ML PO SUSP
30.0000 mL | Freq: Four times a day (QID) | ORAL | Status: DC | PRN
  Administered 2021-09-13: 30 mL via ORAL
  Filled 2021-09-13 (×2): qty 30

## 2021-09-13 MED ORDER — LAMOTRIGINE 100 MG PO TABS: 100.0000 mg | ORAL_TABLET | Freq: Every day | ORAL | Status: DC

## 2021-09-13 MED ORDER — BUSPIRONE HCL 10 MG PO TABS: 10.0000 mg | ORAL_TABLET | Freq: Three times a day (TID) | ORAL | Status: DC

## 2021-09-13 MED ORDER — TRAZODONE HCL 50 MG PO TABS: 300.0000 mg | ORAL_TABLET | Freq: Every evening | ORAL | Status: DC | PRN

## 2021-09-13 MED ORDER — LOPERAMIDE HCL 2 MG PO CAPS: 2.0000 mg | ORAL_CAPSULE | ORAL | Status: AC | PRN

## 2021-09-13 MED ORDER — BUSPIRONE HCL 10 MG PO TABS
10.0000 mg | ORAL_TABLET | Freq: Three times a day (TID) | ORAL | Status: DC
  Administered 2021-09-13: 10 mg via ORAL
  Filled 2021-09-13: qty 1

## 2021-09-13 MED ORDER — TRAZODONE HCL 150 MG PO TABS
300.0000 mg | ORAL_TABLET | Freq: Every evening | ORAL | Status: DC | PRN
  Administered 2021-09-13 – 2021-09-18 (×6): 300 mg via ORAL
  Filled 2021-09-13 (×6): qty 2

## 2021-09-13 MED ORDER — MAGNESIUM HYDROXIDE 400 MG/5ML PO SUSP
30.0000 mL | Freq: Every day | ORAL | Status: DC | PRN
  Filled 2021-09-13: qty 30

## 2021-09-13 MED ORDER — LAMOTRIGINE 25 MG PO TABS
25.0000 mg | ORAL_TABLET | Freq: Every day | ORAL | Status: DC
  Administered 2021-09-14 – 2021-09-21 (×8): 25 mg via ORAL
  Filled 2021-09-13 (×10): qty 1

## 2021-09-13 MED ORDER — GABAPENTIN 300 MG PO CAPS
600.0000 mg | ORAL_CAPSULE | Freq: Three times a day (TID) | ORAL | Status: DC
Start: 2021-09-13 — End: 2021-09-13

## 2021-09-13 MED ORDER — PANTOPRAZOLE SODIUM 40 MG PO TBEC
40.0000 mg | DELAYED_RELEASE_TABLET | Freq: Every day | ORAL | Status: DC
  Administered 2021-09-13: 40 mg via ORAL
  Filled 2021-09-13: qty 1

## 2021-09-13 MED ORDER — LORAZEPAM 1 MG PO TABS
1.0000 mg | ORAL_TABLET | Freq: Three times a day (TID) | ORAL | Status: AC
  Administered 2021-09-15 (×3): 1 mg via ORAL
  Filled 2021-09-13 (×3): qty 1

## 2021-09-13 MED ORDER — ARIPIPRAZOLE 10 MG PO TABS: 15.0000 mg | ORAL_TABLET | Freq: Every day | ORAL | Status: DC

## 2021-09-13 MED ORDER — LORAZEPAM 1 MG PO TABS
1.0000 mg | ORAL_TABLET | Freq: Every day | ORAL | Status: AC
  Administered 2021-09-17: 1 mg via ORAL
  Filled 2021-09-13: qty 1

## 2021-09-13 MED ORDER — THIAMINE HCL 100 MG PO TABS
100.0000 mg | ORAL_TABLET | Freq: Every day | ORAL | Status: DC
  Administered 2021-09-14 – 2021-09-21 (×8): 100 mg via ORAL
  Filled 2021-09-13: qty 1
  Filled 2021-09-13: qty 14
  Filled 2021-09-13 (×8): qty 1

## 2021-09-13 MED ORDER — BUSPIRONE HCL 10 MG PO TABS
10.0000 mg | ORAL_TABLET | Freq: Three times a day (TID) | ORAL | Status: DC
  Administered 2021-09-13 – 2021-09-21 (×21): 10 mg via ORAL
  Filled 2021-09-13: qty 42
  Filled 2021-09-13 (×6): qty 1
  Filled 2021-09-13: qty 42
  Filled 2021-09-13 (×6): qty 1
  Filled 2021-09-13: qty 42
  Filled 2021-09-13 (×13): qty 1

## 2021-09-13 MED ORDER — ROSUVASTATIN CALCIUM 5 MG PO TABS
10.0000 mg | ORAL_TABLET | Freq: Every day | ORAL | Status: DC
  Administered 2021-09-13 – 2021-09-20 (×8): 10 mg via ORAL
  Filled 2021-09-13 (×6): qty 1
  Filled 2021-09-13: qty 14
  Filled 2021-09-13 (×3): qty 1

## 2021-09-13 MED ORDER — HYDROXYZINE HCL 25 MG PO TABS
25.0000 mg | ORAL_TABLET | Freq: Four times a day (QID) | ORAL | Status: AC | PRN
  Administered 2021-09-15 (×3): 25 mg via ORAL
  Filled 2021-09-13 (×3): qty 1

## 2021-09-13 MED ORDER — GABAPENTIN 300 MG PO CAPS: 600.0000 mg | ORAL_CAPSULE | Freq: Three times a day (TID) | ORAL | Status: DC

## 2021-09-13 MED ORDER — THIAMINE HCL 100 MG/ML IJ SOLN: 100.0000 mg | Freq: Once | INTRAMUSCULAR | Status: DC

## 2021-09-13 MED ORDER — ONDANSETRON 4 MG PO TBDP
4.0000 mg | ORAL_TABLET | Freq: Four times a day (QID) | ORAL | Status: AC | PRN
  Administered 2021-09-13 – 2021-09-15 (×3): 4 mg via ORAL
  Filled 2021-09-13 (×3): qty 1

## 2021-09-13 MED ORDER — HYDROXYZINE HCL 25 MG PO TABS
50.0000 mg | ORAL_TABLET | Freq: Three times a day (TID) | ORAL | Status: DC | PRN
  Administered 2021-09-13: 50 mg via ORAL
  Filled 2021-09-13: qty 2

## 2021-09-13 MED ORDER — AMLODIPINE BESYLATE 10 MG PO TABS
10.0000 mg | ORAL_TABLET | Freq: Every day | ORAL | Status: DC
  Administered 2021-09-14 – 2021-09-21 (×7): 10 mg via ORAL
  Filled 2021-09-13 (×4): qty 1
  Filled 2021-09-13: qty 14
  Filled 2021-09-13 (×4): qty 1

## 2021-09-13 MED ORDER — ALUM & MAG HYDROXIDE-SIMETH 200-200-20 MG/5ML PO SUSP: 30.0000 mL | ORAL | Status: DC | PRN

## 2021-09-13 MED ORDER — AMLODIPINE BESYLATE 5 MG PO TABS: 10.0000 mg | ORAL_TABLET | Freq: Every day | ORAL | Status: DC

## 2021-09-13 MED ORDER — ACETAMINOPHEN 325 MG PO TABS
650.0000 mg | ORAL_TABLET | Freq: Four times a day (QID) | ORAL | Status: DC | PRN
  Administered 2021-09-14 – 2021-09-21 (×8): 650 mg via ORAL
  Filled 2021-09-13 (×9): qty 2

## 2021-09-13 MED ORDER — LORAZEPAM 1 MG PO TABS
1.0000 mg | ORAL_TABLET | Freq: Four times a day (QID) | ORAL | Status: AC
  Administered 2021-09-13 – 2021-09-14 (×6): 1 mg via ORAL
  Filled 2021-09-13 (×6): qty 1

## 2021-09-13 MED ORDER — AMLODIPINE BESYLATE 5 MG PO TABS
10.0000 mg | ORAL_TABLET | Freq: Every day | ORAL | Status: DC
  Administered 2021-09-13: 10 mg via ORAL
  Filled 2021-09-13: qty 2

## 2021-09-13 MED ORDER — GABAPENTIN 300 MG PO CAPS
600.0000 mg | ORAL_CAPSULE | Freq: Three times a day (TID) | ORAL | Status: DC
  Administered 2021-09-13 – 2021-09-21 (×23): 600 mg via ORAL
  Filled 2021-09-13 (×31): qty 2

## 2021-09-13 MED ORDER — PANTOPRAZOLE SODIUM 40 MG PO TBEC
40.0000 mg | DELAYED_RELEASE_TABLET | Freq: Every day | ORAL | Status: DC
  Administered 2021-09-14 – 2021-09-21 (×8): 40 mg via ORAL
  Filled 2021-09-13 (×5): qty 1
  Filled 2021-09-13: qty 14
  Filled 2021-09-13 (×3): qty 1

## 2021-09-13 NOTE — ED Notes (Signed)
Pt calm and cooperative at this time. Changed into purple scrubs, belongings placed in locker #3, valuables logged with security. Pt provided with sandwich bag and drink.

## 2021-09-13 NOTE — BHH Group Notes (Signed)
Adult Psychoeducational Group Not Date:  09/13/2021 Time:  6546-5035 Group Topic/Focus: PROGRESSIVE RELAXATION. A group where deep breathing is taught and tensing and relaxation muscle groups is used. Imagery is used as well.  Pts are asked to imagine 3 pillars that hold them up when they are not able to hold themselves up.  Participation Level: did not attend [name]

## 2021-09-13 NOTE — Progress Notes (Signed)
Per Adalberto Ill, [facility], pt has been accepted to [facility] bed [id number]. Accepting provider is Olena Leatherwood, Attending provider is Dr. Berdine Addison. Patient can arrive by 1430. Number for report is [phone].  Glennie Isle, MSW, LCSW-A Phone: [phone] Disposition/TOC

## 2021-09-13 NOTE — ED Notes (Signed)
Pt ate breakfast, is pleasant, and resting at this time.

## 2021-09-13 NOTE — Tx Team (Signed)
Initial Treatment Plan 09/13/2021 7:39 PM [name] UEK:[id number]    PATIENT STRESSORS: Medication change or noncompliance   Substance abuse     PATIENT STRENGTHS: Communication skills  Motivation for treatment/growth  Supportive family/friends    PATIENT IDENTIFIED PROBLEMS: Suicidal ideation     Substance abuse    "I want a change in my meds. They aren't working."             DISCHARGE CRITERIA:  Improved stabilization in mood, thinking, and/or behavior Need for constant or close observation no longer present Reduction of life-threatening or endangering symptoms to within safe limits Verbal commitment to aftercare and medication compliance Withdrawal symptoms are absent or subacute and managed without 24-hour nursing intervention  PRELIMINARY DISCHARGE PLAN: Attend 12-step recovery group Outpatient therapy Return to previous living arrangement  PATIENT/FAMILY INVOLVEMENT: This treatment plan has been presented to and reviewed with the patient, [name], The patient has been given the opportunity to ask questions and make suggestions.  Waymond Cera, RN 09/13/2021, 7:39 PM

## 2021-09-13 NOTE — ED Notes (Signed)
RN received follow up call from poison control. Based on pt condition, results, and EKG, they are comfortable closing the pt's case with them at this time.  RN will monitor for changes in pt QTC in follow up EKGs.

## 2021-09-13 NOTE — BHH Group Notes (Signed)
Adult Psychoeducational Group Not Date:  09/13/2021 Time:  4332-9518 Group Topic/Focus: PROGRESSIVE RELAXATION. A group where deep breathing is taught and tensing and relaxation muscle groups is used. Imagery is used as well.  Pts are asked to imagine 3 pillars that hold them up when they are not able to hold themselves up.  Participation Level: did not attend  Paulino Rily

## 2021-09-13 NOTE — ED Provider Notes (Signed)
Patient medically clear for psychiatric evaluation.   Teressa Lower, MD 09/13/21 1326

## 2021-09-13 NOTE — Progress Notes (Signed)
Patient is a 41 year old male who presented voluntarily from [facility] after a suicide attempt by overdosing on his medications and alcohol. Pt contacted EMS after ingesting hand sanitizer, wine, gabapentin, and trazodone. Pt reports drinking 2-3 bottles of wine, 40 oz beer, and crack almost daily. Pt reported increased depression, not taking showers, decrease appetite, either too little or too much sleep. Patient presented with a sad affect/ depressed mood- calm, cooperative behavior- answered questions logically and coherently throughout admission interview and assessment.   VS monitored and recorded. Skin check performed. Belongings searched and secured in locker. Patient was oriented to unit and schedule. Pt denies SI/HI/AVH at this time. PO fluids provided. Q 15 min checks and fall precautions initiated for safety.

## 2021-09-13 NOTE — ED Provider Notes (Signed)
[facility] EMERGENCY DEPARTMENT Provider Note   CSN: [id number] Arrival date & time: 09/12/21  2204     History  Chief Complaint  Patient presents with   Alcohol Problem   Suicide Attempt    [name] is a 41 y.o. male.  41 yo M returns for alcohol intoxication and suicide attempt by taking medications: 6 Neurontin of unk dose, 600 trazodone, hand sanitizer and wine. Poison control already contacted and recommends supportive care with repeat ECG in 4 hours and 6 hours of total monitoring for medications.    Alcohol Problem      Home Medications Prior to Admission medications   Medication Sig Start Date End Date Taking? Authorizing Provider  amLODipine (NORVASC) 10 MG tablet Take 1 tablet (10 mg total) by mouth daily. 04/09/21  Yes Maudie Mercury, MD  busPIRone (BUSPAR) 10 MG tablet Take 1 tablet (10 mg total) by mouth 3 (three) times daily. 07/14/21  Yes Eulis Canner E, NP  gabapentin (NEURONTIN) 600 MG tablet Take 1 tablet (600 mg total) by mouth 3 (three) times daily. 08/05/21 11/03/21 Yes Maudie Mercury, MD  hydrOXYzine (ATARAX) 50 MG tablet Take 1 tablet (50 mg total) by mouth 3 (three) times daily as needed. Patient taking differently: Take 50 mg by mouth 3 (three) times daily as needed for anxiety. 07/14/21  Yes Eulis Canner E, NP  rosuvastatin (CRESTOR) 10 MG tablet Take 1 tablet (10 mg total) by mouth daily. Patient taking differently: Take 10 mg by mouth at bedtime. 04/09/21  Yes Maudie Mercury, MD  trazodone (DESYREL) 300 MG tablet Take 1 tablet (300 mg total) by mouth at bedtime as needed for sleep (Insomnia). 07/14/21  Yes Eulis Canner E, NP  ARIPiprazole (ABILIFY) 15 MG tablet Take 1 tablet (15 mg total) by mouth daily. Patient not taking: Reported on 09/13/2021 07/14/21   Salley Slaughter, NP  lamoTRIgine (LAMICTAL) 100 MG tablet Take 1 tablet (100 mg total) by mouth daily. Patient not taking: Reported on 09/13/2021 07/14/21    Salley Slaughter, NP  pantoprazole (PROTONIX) 40 MG tablet Take 1 tablet (40 mg total) by mouth daily. Patient not taking: Reported on 07/23/2021 01/13/21   Sherlon Handing, NP  DULoxetine (CYMBALTA) 30 MG capsule Take 1 capsule (30 mg total) by mouth daily. Patient not taking: No sig reported 10/27/20 01/07/21  Eulis Canner E, NP      Allergies    Codeine, Penicillins, Morphine, Coconut flavor, Coconut oil, Grapefruit concentrate, Morphine and related, Oxycodone, and Norco [hydrocodone-acetaminophen]    Review of Systems   Review of Systems  Physical Exam Updated Vital Signs BP 105/71 (BP Location: Left Arm)    Pulse (!) 104    Temp 99.2 F (37.3 C) (Oral)    Resp 19    SpO2 95%  Physical Exam Vitals and nursing note reviewed.  Constitutional:      Appearance: He is well-developed.  HENT:     Head: Normocephalic and atraumatic.     Mouth/Throat:     Mouth: Mucous membranes are moist.     Pharynx: Oropharynx is clear.  Eyes:     Pupils: Pupils are equal, round, and reactive to light.  Cardiovascular:     Rate and Rhythm: Normal rate.  Pulmonary:     Effort: Pulmonary effort is normal. No respiratory distress.  Abdominal:     General: Abdomen is flat. There is no distension.  Musculoskeletal:        General: No swelling or tenderness.  Normal range of motion.     Cervical back: Normal range of motion.  Skin:    General: Skin is warm and dry.     Coloration: Skin is not jaundiced or pale.  Neurological:     General: No focal deficit present.     Mental Status: He is alert.  Psychiatric:        Thought Content: Thought content includes suicidal ideation. Thought content includes suicidal plan.    ED Results / Procedures / Treatments   Labs (all labs ordered are listed, but only abnormal results are displayed) Labs Reviewed  COMPREHENSIVE METABOLIC PANEL - Abnormal; Notable for the following components:      Result Value   Calcium 8.3 (*)    All other components  within normal limits  ETHANOL - Abnormal; Notable for the following components:   Alcohol, Ethyl (B) 370 (*)    All other components within normal limits  RAPID URINE DRUG SCREEN, HOSP PERFORMED - Abnormal; Notable for the following components:   Cocaine POSITIVE (*)    All other components within normal limits  ACETAMINOPHEN LEVEL - Abnormal; Notable for the following components:   Acetaminophen (Tylenol), Serum <10 (*)    All other components within normal limits  RESP PANEL BY RT-PCR (FLU A&B, COVID) ARPGX2  CBC WITH DIFFERENTIAL/PLATELET    EKG EKG Interpretation  Date/Time:  Sunday September 13 2021 05:58:49 EST Ventricular Rate:  83 PR Interval:  126 QRS Duration: 78 QT Interval:  386 QTC Calculation: 453 R Axis:   81 Text Interpretation: Normal sinus rhythm Normal ECG When compared with ECG of 12-Sep-2021 22:53, PREVIOUS ECG IS PRESENT Confirmed by Merrily Pew [phone]) on 09/13/2021 6:14:15 AM  Radiology No results found.  Procedures Procedures    Medications Ordered in ED Medications  LORazepam (ATIVAN) injection 0-4 mg ( Intravenous See Alternative 09/13/21 0543)    Or  LORazepam (ATIVAN) tablet 0-4 mg (2 mg Oral Given 09/13/21 0543)  LORazepam (ATIVAN) injection 0-4 mg (has no administration in time range)    Or  LORazepam (ATIVAN) tablet 0-4 mg (has no administration in time range)  thiamine tablet 100 mg (has no administration in time range)    Or  thiamine (B-1) injection 100 mg (has no administration in time range)  acetaminophen (TYLENOL) tablet 650 mg (has no administration in time range)  ondansetron (ZOFRAN) tablet 4 mg (has no administration in time range)  alum & mag hydroxide-simeth (MAALOX/MYLANTA) 200-200-20 MG/5ML suspension 30 mL (has no administration in time range)  pantoprazole (PROTONIX) EC tablet 40 mg (has no administration in time range)  amLODipine (NORVASC) tablet 10 mg (has no administration in time range)  rosuvastatin (CRESTOR) tablet  10 mg (has no administration in time range)  ARIPiprazole (ABILIFY) tablet 15 mg (has no administration in time range)  busPIRone (BUSPAR) tablet 10 mg (has no administration in time range)  hydrOXYzine (ATARAX) tablet 50 mg (has no administration in time range)  lamoTRIgine (LAMICTAL) tablet 100 mg (has no administration in time range)  traZODone (DESYREL) tablet 300 mg (has no administration in time range)  gabapentin (NEURONTIN) tablet 600 mg (has no administration in time range)    ED Course/ Medical Decision Making/ A&P                           Medical Decision Making Amount and/or Complexity of Data Reviewed Labs: ordered. ECG/medicine tests: ordered.  Risk OTC drugs. Prescription drug management.  Attempted suicide. Needs medical clearance per above and the will be inpatient.   EtOH significantly elevated. Will need at least 10 hours for that to metabolize (0930-ish).   Will get repeat ECG now to evaluate for QT.   QT improved. Per poison control, is medically cleared.   EtOH should be <100 around 0930.   Home meds ordered.   Final Clinical Impression(s) / ED Diagnoses Final diagnoses:  None    Rx / DC Orders ED Discharge Orders     None         Fortunato Nordin, Corene Cornea, MD 09/13/21 [phone]

## 2021-09-14 ENCOUNTER — Encounter (HOSPITAL_COMMUNITY): Payer: Self-pay

## 2021-09-14 MED ORDER — ARIPIPRAZOLE 5 MG PO TABS
5.0000 mg | ORAL_TABLET | Freq: Every day | ORAL | Status: DC
  Administered 2021-09-14 – 2021-09-21 (×8): 5 mg via ORAL
  Filled 2021-09-14 (×8): qty 1
  Filled 2021-09-14: qty 14
  Filled 2021-09-14 (×3): qty 1

## 2021-09-14 MED ORDER — TOPIRAMATE 25 MG PO TABS
25.0000 mg | ORAL_TABLET | Freq: Two times a day (BID) | ORAL | Status: DC
Start: 2021-09-14 — End: 2021-09-21
  Administered 2021-09-14 – 2021-09-21 (×14): 25 mg via ORAL
  Filled 2021-09-14 (×3): qty 1
  Filled 2021-09-14: qty 28
  Filled 2021-09-14 (×4): qty 1
  Filled 2021-09-14: qty 28
  Filled 2021-09-14 (×11): qty 1

## 2021-09-14 NOTE — Plan of Care (Signed)
°  Problem: Education: Goal: Emotional status will improve Outcome: Not Progressing Goal: Mental status will improve Outcome: Not Progressing   Problem: Activity: Goal: Interest or engagement in activities will improve Outcome: Not Progressing   

## 2021-09-14 NOTE — Progress Notes (Signed)
Psychoeducational Group Note  Date:  09/14/2021 Time:  2129  Group Topic/Focus:  Wrap-Up Group:   The focus of this group is to help patients review their daily goal of treatment and discuss progress on daily workbooks.  Participation Level: Did Not Attend  Participation Quality:  Not Applicable  Affect:  Not Applicable  Cognitive:  Not Applicable  Insight:  Not Applicable  Engagement in Group: Not Applicable  Additional Comments:  The patient did not attend group this evening.   Archie Balboa S 09/14/2021, 9:29 PM

## 2021-09-14 NOTE — Group Note (Signed)
Recreation Therapy Group Note   Group Topic:Stress Management  Group Date: 09/14/2021 Start Time: 0930 End Time: 0950 Facilitators: Victorino Sparrow, [place] Location: [address] Dayroom   Goal Area(s) Addresses:  Patient will identify positive stress management techniques. Patient will identify benefits of using stress management post d/c.  Group Description:  Stress Release.  LRT played a meditation that focused on releasing stress through breathing and muscle tension and release.  Patients were to follow along as meditation played and go through the steps as the speaker was leading them through.  LRT and patients also discussed places like apps and Youtube  to access other stress management techniques.    Affect/Mood: N/A   Participation Level: Did not attend    Clinical Observations/Individualized Feedback:     Plan: Continue to engage patient in RT group sessions 2-3x/week.   Victorino Sparrow, Glennis Brink 09/14/2021 12:19 PM

## 2021-09-14 NOTE — BH IP Treatment Plan (Signed)
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 ([place]) Active Problems:   Alcohol use disorder, severe, dependence ([place])   PTSD (post-traumatic stress disorder)   Cocaine dependence syndrome ([place])   Polysubstance abuse ([place])   Tobacco use disorder   GAD (generalized anxiety disorder)   MDD (major depressive disorder), recurrent episode, severe ([place])   Current Medications:  Current Facility-Administered Medications  Medication Dose Route Frequency Provider Last Rate Last Admin   acetaminophen (TYLENOL) tablet 650 mg  650 mg Oral Q6H PRN Merlyn Lot E, NP   650 mg at 09/14/21 0825   alum & mag hydroxide-simeth (MAALOX/MYLANTA) 200-200-20 MG/5ML suspension 30 mL  30 mL Oral Q4H PRN Mallie Darting, NP       amLODipine (NORVASC) tablet 10 mg  10 mg Oral Daily Merlyn Lot E, NP   10 mg at 09/14/21 1975   busPIRone (BUSPAR) tablet 10 mg  10 mg Oral TID Mallie Darting, NP   10 mg at 09/14/21 8832   gabapentin (NEURONTIN) capsule 600 mg  600 mg Oral TID Mallie Darting, NP   600 mg at 09/14/21 5498   hydrOXYzine (ATARAX) tablet 25 mg  25 mg Oral Q6H PRN Mallie Darting, NP       lamoTRIgine (LAMICTAL) tablet 25 mg  25 mg Oral Daily Merlyn Lot E, NP   25 mg at 09/14/21 2641   loperamide (IMODIUM) capsule 2-4 mg  2-4 mg Oral PRN Mallie Darting, NP       LORazepam (ATIVAN) tablet 1 mg  1 mg Oral Q6H PRN Mallie Darting, NP       LORazepam (ATIVAN) tablet 1 mg  1 mg Oral QID Merlyn Lot E, NP   1 mg at 09/14/21 5830   Followed by   Derrill Memo ON 09/15/2021] LORazepam (ATIVAN) tablet 1 mg  1 mg Oral TID Mallie Darting, NP       Followed by   Derrill Memo ON 09/16/2021] LORazepam (ATIVAN) tablet 1 mg  1 mg Oral BID Merlyn Lot E, NP       Followed by   Derrill Memo ON 09/17/2021] LORazepam (ATIVAN) tablet 1 mg  1 mg Oral Daily Merlyn Lot E, NP       magnesium hydroxide (MILK OF MAGNESIA) suspension 30 mL  30 mL Oral Daily PRN Merlyn Lot E, NP       multivitamin with minerals tablet 1 tablet  1 tablet Oral Daily Merlyn Lot E, NP   1 tablet at 09/14/21 0822   ondansetron (ZOFRAN-ODT) disintegrating tablet 4 mg  4 mg Oral Q6H PRN Merlyn Lot E, NP   4 mg at 09/13/21 1637   pantoprazole (PROTONIX) EC tablet 40 mg  40 mg Oral Daily Merlyn Lot E, NP   40 mg at 09/14/21 9407   rosuvastatin (CRESTOR) tablet 10 mg  10 mg Oral QHS Merlyn Lot E, NP   10 mg at 09/13/21 2212   thiamine (B-1) injection 100 mg  100 mg Intramuscular Once Merlyn Lot E, NP       thiamine tablet 100 mg  100 mg Oral Daily Merlyn Lot E, NP   100 mg at 09/14/21 6808   traZODone (DESYREL) tablet 300 mg  300 mg Oral QHS PRN Bobbitt, Lennie Muckle, NP  300 mg at 09/13/21 2121   PTA Medications: Medications Prior to Admission  Medication Sig Dispense Refill Last Dose   amLODipine (NORVASC) 10 MG tablet Take 1 tablet (10 mg total) by mouth daily. 90 tablet 3    ARIPiprazole (ABILIFY) 15 MG tablet Take 1 tablet (15 mg total) by mouth daily. (Patient not taking: Reported on 09/13/2021) 30 tablet 3    busPIRone (BUSPAR) 10 MG tablet Take 1 tablet (10 mg total) by mouth 3 (three) times daily. 90 tablet 3    gabapentin (NEURONTIN) 600 MG tablet Take 1 tablet (600 mg total) by mouth 3 (three) times daily. 180 tablet 2    hydrOXYzine (ATARAX) 50 MG tablet Take 1 tablet (50 mg total) by mouth 3 (three) times daily as needed. (Patient taking differently: Take 50 mg by mouth 3 (three) times daily as needed for anxiety.) 90 tablet 3    lamoTRIgine (LAMICTAL) 100 MG tablet Take 1 tablet (100 mg total) by mouth daily. (Patient not taking: Reported on 09/13/2021) 30 tablet 3    pantoprazole (PROTONIX) 40 MG tablet Take 1 tablet (40 mg total) by mouth daily.  (Patient not taking: Reported on 07/23/2021) 30 tablet 0    rosuvastatin (CRESTOR) 10 MG tablet Take 1 tablet (10 mg total) by mouth daily. (Patient taking differently: Take 10 mg by mouth at bedtime.) 90 tablet 3    trazodone (DESYREL) 300 MG tablet Take 1 tablet (300 mg total) by mouth at bedtime as needed for sleep (Insomnia). 30 tablet 3     Patient Stressors: Medication change or noncompliance   Substance abuse    Patient Strengths: Hydrographic surveyor for treatment/growth  Supportive family/friends   Treatment Modalities: Medication Management, Group therapy, Case management,  1 to 1 session with clinician, Psychoeducation, Recreational therapy.   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  Therapeutic Interventions: 1 to 1 sessions, Unit Group sessions and Medication administration.  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  Therapeutic Interventions: 1 to 1 sessions, Unit Group sessions and Medication administration.  Evaluation of Outcomes: Not Met   RN Treatment Plan for Primary Diagnosis: Major depressive disorder, recurrent, severe w/o psychotic  behavior ([place]) Long Term Goal(s): Knowledge of disease and therapeutic regimen to maintain health will improve  Short Term Goals: Ability to remain free from injury will improve, Ability to verbalize frustration and anger appropriately will improve, Ability to disclose and discuss suicidal ideas, Ability to identify and develop effective coping behaviors will improve, and Compliance with prescribed medications will improve  Medication Management: RN will administer medications as ordered by provider, will assess and evaluate patient's response and provide education to patient for prescribed medication. RN will report any adverse and/or side effects to prescribing provider.  Therapeutic Interventions: 1 on 1 counseling sessions, Psychoeducation, Medication administration, Evaluate responses to treatment, Monitor vital signs and CBGs as ordered, Perform/monitor CIWA, COWS, AIMS and Fall Risk screenings as ordered, Perform wound care treatments as ordered.  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  Short Term Goals: Engage patient in aftercare planning with referrals and resources, Increase social support, Increase ability to appropriately verbalize feelings, Increase emotional regulation, Identify triggers associated with mental health/substance abuse issues, and Increase skills for wellness and recovery  Therapeutic Interventions: Assess for all discharge needs, 1 to 1 time with Social worker, Explore available resources and support systems, Assess for adequacy in community support network, Educate family and significant other(s) on suicide prevention, Complete Psychosocial Assessment, Interpersonal group therapy.  Evaluation of Outcomes:  Not Met   Progress in Treatment: Attending groups: No. Participating in groups: No. Taking medication as prescribed: Yes. Toleration medication: Yes. Family/Significant other contact made: No, will contact:  if consent is provided Patient understands diagnosis: Yes. Discussing patient identified problems/goals with staff: Yes. Medical problems stabilized or resolved: Yes. Denies suicidal/homicidal ideation: Yes. Issues/concerns per patient self-inventory: No.   New problem(s) identified: No, Describe:  none  New Short Term/Long Term Goal(s): detox, medication management for mood stabilization; elimination of SI thoughts; development of comprehensive mental wellness/sobriety plan  Patient Goals:  "To get my medicine right and go to long term treatment"  Discharge Plan or Barriers: Patient recently admitted. CSW will continue to follow and assess for appropriate referrals and possible discharge planning.    Reason for Continuation of Hospitalization: Depression Medication stabilization Suicidal ideation Withdrawal symptoms  Estimated Length of Stay: 3-5 days   Scribe for Treatment Team: Vassie Moselle, LCSW 09/14/2021 10:33 AM

## 2021-09-14 NOTE — H&P (Addendum)
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 600 mg TID   HTN: -Continue Norvasc 10 mg daily   Hyperlipidemia: -Continue Rosuvastatin 10 mg QHS   -Continue Protonix 40 mg daily -Continue PRN's: Tylenol, Maalox, Atarax, Milk of Magnesia, Trazodone   Observation Level/Precautions:  15 minute checks  Laboratory:  CMP: WNL except Ca:8.3,  CBC: WNL,  EtOH: 370,  Acetaminophen: WNL,  UDS: Cocaine positive,  Resp panel: Neg,  EKG: Sinus Rhythm with Qtc: 467 Lipid Panel/A1c/TSH ordered  Psychotherapy:    Medications:  Abilify, Lamictal, Ativan, Topamax  Consultations:    Discharge Concerns:    Estimated LOS:  Other:     Physician Treatment Plan for Primary Diagnosis:  Major depressive disorder, recurrent, 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 ([place]) Long Term Goal(s): Improvement in symptoms so as ready for discharge  Short Term Goals: Ability to identify changes in lifestyle to reduce recurrence of condition will improve, Ability to verbalize feelings will improve, Ability to disclose and discuss suicidal ideas, Ability to demonstrate self-control will improve, Ability to identify and develop effective coping behaviors will improve, Compliance with prescribed medications will improve, and Ability to identify triggers associated with substance abuse/mental health issues will improve  Physician Treatment Plan for Secondary Diagnosis: Principal Problem:   Major depressive disorder, recurrent, 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)   Tobacco use disorder   GAD (generalized anxiety disorder)   MDD (major depressive disorder), recurrent episode, severe ([place])  Long Term Goal(s): Improvement in symptoms so as ready for discharge  Short Term  Goals: Ability to identify changes in lifestyle to reduce recurrence of condition will improve, Ability to verbalize feelings will improve, Ability to disclose and discuss suicidal ideas, Ability to demonstrate self-control will improve, Ability to identify and develop effective coping behaviors will improve, Compliance with prescribed medications will improve, and Ability to identify triggers associated with substance abuse/mental health issues will improve  I certify that inpatient services furnished can reasonably be expected to improve the patient's condition.    Briant Cedar, MD 2/27/20236:59 PM

## 2021-09-14 NOTE — Progress Notes (Signed)
Stated AH getting better    09/14/21 2300  Psych Admission Type (Psych Patients Only)  Admission Status Voluntary  Psychosocial Assessment  Patient Complaints Anxiety  Eye Contact Fair  Facial Expression Anxious  Affect Anxious  Speech Logical/coherent  Interaction Forwards little  Motor Activity Slow  Appearance/Hygiene Disheveled  Behavior Characteristics Anxious  Mood Anxious;Depressed  Aggressive Behavior  Effect No apparent injury  Thought Process  Coherency WDL  Content WDL  Delusions None reported or observed  Perception Hallucinations  Hallucination Auditory  Judgment Impaired  Confusion None  Danger to Self  Current suicidal ideation? Denies  Danger to Others  Danger to Others None reported or observed

## 2021-09-14 NOTE — Group Note (Deleted)
LCSW Group Therapy Note   Group Date: 09/14/2021 Start Time: 1300 End Time: 1400   Type of Therapy and Topic:  Group Therapy:   Participation Level:  {BHH PARTICIPATION WBDGR:24799}  Description of Group:   Therapeutic Goals:  1.     Summary of Patient Progress:    ***  Therapeutic Modalities:   Eliott Nine 09/14/2021  1:24 PM

## 2021-09-14 NOTE — BHH Group Notes (Signed)
Orientation Group and Psychoeducational teaching. Patients were asked to share how they are feeling mentally today, and unit and ward rules, expectations were discussed. Psychoeducational teaching was then discussed with habits of mindfulness and positive reframing techniques to improve mental health. The patients were given a poem of the [place] and asked one technique to use to help calm anxiety and negative thoughts. The patient did not attend.

## 2021-09-14 NOTE — Progress Notes (Signed)
D:  Patient denied SI and HI, contracts for safety.  Denied A/V hallucinations.   A:  Medications administered per MD orders.  Emotional support and encouragement given patient. R:  Safety maintained with 15 minute checks.  
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
## 2021-09-14 NOTE — Plan of Care (Signed)
Nurse discussed anxiety, depression and coping skills with patient.  

## 2021-09-14 NOTE — Progress Notes (Signed)
° °  Pt reports he has a headache, stomach ache, and feels congested.  Will inform Day shift Nurse in report.

## 2021-09-14 NOTE — BHH Counselor (Signed)
CSW attempted to complete assessment with pt but was unable to engage pt due to him sleeping. CSW will make another attempt at a later time.   Toney Reil, [place] Worker [employer]

## 2021-09-14 NOTE — BHH Counselor (Signed)
CSW contacted the [place] who states pt does not have a legal guardian. No paperwork in Epic to verify pt has a legal guardian. CSW removed flag.   Toney Reil, [place] Worker [employer]

## 2021-09-14 NOTE — Group Note (Signed)
LCSW Group Therapy Note  Group Date: 09/14/2021 Start Time: 1300 End Time: 1400   Type of Therapy and Topic:  Group Therapy: Positive Affirmations  Participation Level:  Did Not Attend   Description of Group:   This group addressed positive affirmation towards self and others.  Patients went around the room and identified two positive things about themselves and two positive things about a peer in the room.  Patients reflected on how it felt to share something positive with others, to identify positive things about themselves, and to hear positive things from others/ Patients were encouraged to have a daily reflection of positive characteristics or circumstances.   Therapeutic Goals: Patients will verbalize two of their positive qualities Patients will demonstrate empathy for others by stating two positive qualities about a peer in the group Patients will verbalize their feelings when voicing positive self affirmations and when voicing positive affirmations of others Patients will discuss the potential positive impact on their wellness/recovery of focusing on positive traits of self and others.  Summary of Patient Progress:  Did Not Attemd  Therapeutic Modalities:   Cognitive Behavioral Therapy Motivational Interviewing    Eliott Nine 09/14/2021  1:26 PM

## 2021-09-15 MED ORDER — DIAZEPAM 5 MG PO TABS
5.0000 mg | ORAL_TABLET | Freq: Once | ORAL | Status: AC
  Administered 2021-09-15: 5 mg via ORAL

## 2021-09-15 MED ORDER — DIAZEPAM 5 MG PO TABS
ORAL_TABLET | ORAL | Status: AC
  Filled 2021-09-15: qty 1

## 2021-09-15 NOTE — Progress Notes (Signed)
°   09/15/21 0500  Sleep  Number of Hours 5.5
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
## 2021-09-15 NOTE — Progress Notes (Signed)
°   09/15/21 1700  Psych Admission Type (Psych Patients Only)  Admission Status Voluntary  Psychosocial Assessment  Patient Complaints Anxiety  Eye Contact Fair  Facial Expression Anxious  Affect Anxious  Speech Logical/coherent  Interaction Arrogant  Motor Activity Slow  Appearance/Hygiene Disheveled  Behavior Characteristics Anxious  Mood Anxious;Depressed  Thought Process  Coherency WDL  Content WDL  Delusions None reported or observed  Perception WDL  Hallucination None reported or observed  Judgment Impaired  Confusion None  Danger to Self  Current suicidal ideation? Denies  Danger to Others  Danger to Others None reported or observed

## 2021-09-15 NOTE — BHH Group Notes (Signed)
[place] Group Notes:  (Nursing/MHT/Case Management/Adjunct)  Date:  09/15/2021  Time:  9:26 PM  Type of Therapy:    Wrap up  Participation Level:  None  Participation Quality:  Appropriate and Attentive  Affect:  Appropriate  Cognitive:  Appropriate  Insight:  None  Engagement in Group:  Engaged  Modes of Intervention:  Discussion  Summary of Progress/Problems: PT attened group but did not share. He said he hasn't been feeling well.   Maxine Glenn 09/15/2021, 9:26 PM

## 2021-09-15 NOTE — Group Note (Signed)
Recreation Therapy Group Note   Group Topic:Animal Assisted Therapy   Group Date: 09/15/2021 Start Time: 9379 End Time: 1507 Facilitators: Victorino Sparrow, LRT,CTRS Location: [place]   AAA/T Program Assumption of Risk Form signed by Patient/ or Parent Legal Guardian YES  Patient understands their participation is voluntary YES   Group Description: Patients provided opportunity to interact with trained and credentialed [employer] Therapy dog and the community volunteer/dog handler. Patients practiced appropriate animal interaction and were educated on dog safety outside of the hospital in common community settings. Patients were allowed to use dog toys and other items to practice commands, engage the dog in play, and/or complete routine aspects of animal care.    Affect/Mood: N/A   Participation Level: Did not attend    Clinical Observations/Individualized Feedback:     Plan: Continue to engage patient in RT group sessions 2-3x/week.   Victorino Sparrow, LRT,CTRS 09/15/2021 3:13 PM
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
## 2021-09-15 NOTE — Progress Notes (Signed)
Pt stated he felt a little better today, pt concerned about sleeping only on 300 mg Trazodone due to it being ineffective last night, pt stated  Seroquel with the Trazodone has been effective in the past . Pt was given PRN Vistaril with HS medications     09/15/21 2200  Psych Admission Type (Psych Patients Only)  Admission Status Voluntary  Psychosocial Assessment  Patient Complaints Anxiety  Eye Contact Fair  Facial Expression Anxious  Affect Anxious  Speech Logical/coherent  Interaction Assertive  Motor Activity Slow  Appearance/Hygiene Disheveled  Behavior Characteristics Anxious  Mood Anxious  Aggressive Behavior  Effect No apparent injury  Thought Process  Coherency WDL  Content WDL  Delusions WDL  Perception WDL  Hallucination None reported or observed  Judgment WDL  Confusion None  Danger to Self  Current suicidal ideation? Denies  Danger to Others  Danger to Others None reported or observed

## 2021-09-16 MED ORDER — DIAZEPAM 5 MG PO TABS
5.0000 mg | ORAL_TABLET | Freq: Once | ORAL | Status: AC
  Administered 2021-09-16: 5 mg via ORAL

## 2021-09-16 MED ORDER — DIAZEPAM 5 MG PO TABS
ORAL_TABLET | ORAL | Status: AC
  Filled 2021-09-16: qty 1

## 2021-09-16 MED ORDER — SERTRALINE HCL 25 MG PO TABS
25.0000 mg | ORAL_TABLET | Freq: Every day | ORAL | Status: DC
  Administered 2021-09-16 – 2021-09-18 (×3): 25 mg via ORAL
  Filled 2021-09-16 (×5): qty 1

## 2021-09-16 NOTE — Group Note (Signed)
Recreation Therapy Group Note ? ? ?Group Topic:Stress Management  ?Group Date: 09/16/2021 ?Start Time: 0930 ?End Time: 0950 ?Facilitators: Victorino Sparrow, LRT,CTRS ?Location: [place] ? ? ?Goal Area(s) Addresses:  ?Patient will actively participate in stress management techniques presented during session.  ?Patient will successfully identify benefit of practicing stress management post d/c.  ? ?Group Description: Guided Imagery. LRT provided education, instruction, and demonstration on practice of visualization via guided imagery. Patient was asked to participate in the technique introduced during session. LRT debriefed including topics of mindfulness, stress management and specific scenarios each patient could use these techniques. Patients were given suggestions of ways to access scripts post d/c and encouraged to explore Youtube and other apps available on smartphones, tablets, and computers. ? ? ?Affect/Mood: N/A ?  ?Participation Level: Did not attend ?  ? ?Clinical Observations/Individualized Feedback:   ? ? ?Plan: Continue to engage patient in RT group sessions 2-3x/week. ? ? ?Victorino Sparrow, LRT,CTRS ?09/16/2021 11:15 AM ?

## 2021-09-16 NOTE — [facility] Group Notes (Signed)
Patient did not attend the relaxation group. 

## 2021-09-16 NOTE — Group Note (Signed)
LCSW Group Therapy Note ? ?Group Date: 09/16/2021 ?Start Time: 1300 ?End Time: 1400 ? ? ?Type of Therapy and Topic:  Group Therapy: Using "I" Statements ? ?Participation Level:  Did Not Attend ? ?Description of Group:  ?Patients were asked to provide details of some interpersonal conflicts they have experienced. Patients were then educated about ?I? statements, communication which focuses on feelings or views of the speaker rather than what the other person is doing. T group members were asked to reflect on past conflicts and to provide specific examples for utilizing ?I? statements. ? ?Therapeutic Goals: ? ?Patients will verbalize understanding of ineffective communication and effective communication. ?Patients will be able to empathize with whom they are having conflict. ?Patients will practice effective communication in the form of ?I? statements. ? ? ? ?Summary of Patient Progress: Did Not Attend ? ?Therapeutic Modalities:   ?Cognitive Behavioral Therapy ?Solution-Focused Therapy ? ? ? ?[name], LCSW ?09/16/2021  1:57 PM   ? ?

## 2021-09-16 NOTE — Progress Notes (Signed)
Pt denies SI/HI/AVH and verbally agrees to approach staff if these become apparent or before harming themselves/others. Rates depression 0/10. Rates anxiety 7/10. Rates pain 7/10. Pt has been in his bed for the majority of the day. Pt stated in the afternoon that he has started sweating and having body aches. PRNs were given. Scheduled medications administered to pt, per MD orders. RN provided support and encouragement to pt. Q15 min safety checks implemented and continued. Pt safe on the unit. RN will continue to monitor and intervene as needed.  ? 09/16/21 0802  ?Psych Admission Type (Psych Patients Only)  ?Admission Status Voluntary  ?Psychosocial Assessment  ?Patient Complaints Anxiety  ?Eye Contact Fair  ?Facial Expression Anxious  ?Affect Anxious  ?Speech Logical/coherent  ?Interaction Assertive  ?Motor Activity Slow  ?Appearance/Hygiene Unremarkable  ?Behavior Characteristics Cooperative;Anxious;Appropriate to situation  ?Mood Anxious;Pleasant  ?Thought Process  ?Coherency WDL  ?Content WDL  ?Delusions None reported or observed  ?Perception WDL  ?Hallucination None reported or observed  ?Judgment Impaired  ?Confusion None  ?Danger to Self  ?Current suicidal ideation? Denies  ?Danger to Others  ?Danger to Others None reported or observed  ? ? ?
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
## 2021-09-16 NOTE — Progress Notes (Signed)
?   09/16/21 0500  ?Sleep  ?Number of Hours 5.5  ? ? ?

## 2021-09-16 NOTE — Progress Notes (Signed)
Psychoeducational Group Note ? ?Date:  09/16/2021 ?Time:  2133 ? ?Group Topic/Focus:  ?Wrap-Up Group:   The focus of this group is to help patients review their daily goal of treatment and discuss progress on daily workbooks. ? ?Participation Level: Did Not Attend ? ?Participation Quality:  Not Applicable ? ?Affect:  Not Applicable ? ?Cognitive:  Not Applicable ? ?Insight:  Not Applicable ? ?Engagement in Group: Not Applicable ? ?Additional Comments:  The patient did not attend the evening N.A.meeting.  ? ?Archie Balboa S ?09/16/2021, 9:33 PM  ?

## 2021-09-16 NOTE — Progress Notes (Signed)
?   09/16/21 2300  ?Psych Admission Type (Psych Patients Only)  ?Admission Status Voluntary  ?Psychosocial Assessment  ?Patient Complaints Anxiety;Substance abuse  ?Eye Contact Fair  ?Facial Expression Anxious  ?Affect Anxious  ?Speech Logical/coherent  ?Interaction Assertive  ?Motor Activity Slow  ?Appearance/Hygiene Unremarkable  ?Behavior Characteristics Cooperative  ?Mood Pleasant  ?Aggressive Behavior  ?Effect No apparent injury  ?Thought Process  ?Coherency WDL  ?Content WDL  ?Delusions WDL  ?Perception WDL  ?Hallucination None reported or observed  ?Judgment Impaired  ?Confusion WDL  ?Danger to Self  ?Current suicidal ideation? Denies  ?Danger to Others  ?Danger to Others None reported or observed  ? ? ?

## 2021-09-17 ENCOUNTER — Encounter (HOSPITAL_COMMUNITY): Payer: Self-pay | Admitting: Psychiatry

## 2021-09-17 ENCOUNTER — Inpatient Hospital Stay (HOSPITAL_COMMUNITY): Payer: 59

## 2021-09-17 DIAGNOSIS — F332 Major depressive disorder, recurrent severe without psychotic features: Principal | ICD-10-CM

## 2021-09-17 DIAGNOSIS — I2510 Atherosclerotic heart disease of native coronary artery without angina pectoris: Secondary | ICD-10-CM

## 2021-09-17 LAB — BASIC METABOLIC PANEL
Anion gap: 8 (ref 5–15)
BUN: 9 mg/dL (ref 6–20)
CO2: 24 mmol/L (ref 22–32)
Calcium: 9.5 mg/dL (ref 8.9–10.3)
Chloride: 103 mmol/L (ref 98–111)
Creatinine, Ser: 1.05 mg/dL (ref 0.61–1.24)
GFR, Estimated: >60 mL/min (ref >60)
Glucose, Bld: 95 mg/dL (ref 70–99)
Potassium: 4.2 mmol/L (ref 3.5–5.1)
Sodium: 135 mmol/L (ref 135–145)

## 2021-09-17 LAB — TROPONIN I (HIGH SENSITIVITY)
Troponin I (High Sensitivity): 3 ng/L (ref <18)
Troponin I (High Sensitivity): 3 ng/L (ref <18)

## 2021-09-17 LAB — CBC
HCT: 45.4 % (ref 39.0–52.0)
Hemoglobin: 14.9 g/dL (ref 13.0–17.0)
MCH: 29.6 pg (ref 26.0–34.0)
MCHC: 32.8 g/dL (ref 30.0–36.0)
MCV: 90.1 fL (ref 80.0–100.0)
Platelets: 173 10*3/uL (ref 150–400)
RBC: 5.04 MIL/uL (ref 4.22–5.81)
RDW: 13.8 % (ref 11.5–15.5)
WBC: 5.2 10*3/uL (ref 4.0–10.5)
nRBC: 0 % (ref 0.0–0.2)

## 2021-09-17 LAB — D-DIMER, QUANTITATIVE: D-Dimer, Quant: 0.28 ug/mL-FEU (ref 0.00–0.50)

## 2021-09-17 MED ORDER — PROPRANOLOL HCL 10 MG PO TABS
10.0000 mg | ORAL_TABLET | Freq: Three times a day (TID) | ORAL | Status: DC
  Administered 2021-09-17 – 2021-09-21 (×9): 10 mg via ORAL
  Filled 2021-09-17 (×8): qty 1
  Filled 2021-09-17 (×2): qty 42
  Filled 2021-09-17 (×3): qty 1
  Filled 2021-09-17: qty 42
  Filled 2021-09-17 (×3): qty 1

## 2021-09-17 NOTE — Progress Notes (Signed)
?   09/17/21 2157  ?Psych Admission Type (Psych Patients Only)  ?Admission Status Voluntary  ?Psychosocial Assessment  ?Patient Complaints Anxiety;Depression  ?Eye Contact Fair  ?Facial Expression Sullen;Sad  ?Affect Appropriate to circumstance  ?Speech Logical/coherent;Soft  ?Interaction Assertive  ?Motor Activity Slow  ?Appearance/Hygiene In scrubs  ?Behavior Characteristics Cooperative;Appropriate to situation  ?Mood Pleasant  ?Thought Process  ?Coherency WDL  ?Content WDL  ?Delusions None reported or observed  ?Perception WDL  ?Hallucination None reported or observed  ?Judgment Impaired  ?Confusion None  ?Danger to Self  ?Current suicidal ideation? Denies  ?Danger to Others  ?Danger to Others None reported or observed  ? ? ?

## 2021-09-17 NOTE — Group Note (Signed)
Date:  09/17/2021 ?Time:  9:44 AM ? ?Group Topic/Focus:  ?Orientation:   The focus of this group is to educate the patient on the purpose and policies of crisis stabilization and provide a format to answer questions about their admission.  The group details unit policies and expectations of patients while admitted. ? ? ? ?Participation Level:  Did Not Attend ? ?Participation Quality:   ? ?Affect:   ? ?Cognitive:   ? ?Insight:  ? ?Engagement in Group:   ? ?Modes of Intervention:   ? ?Additional Comments:   ? ?[name] ?09/17/2021, 9:44 AM ? ?

## 2021-09-17 NOTE — Progress Notes (Signed)
Pt denies SI/HI/AVH and verbally agrees to approach staff if these become apparent or before harming themselves/others. Rates depression 5/10. Rates anxiety 3/10. Rates pain 4/10.  Pt started to complain of chest pain radiating down the left arm at 1130 and EKG was done. MD notified. MD requested to send pt to [facility] for an heart work up. Non-emergent EMS was called. Pt was transferred to the ED with an MHT at 1150.  ?Scheduled medications administered to pt, per MD orders. RN provided support and encouragement to pt. Q15 min safety checks implemented and continued. Pt safe on the unit. RN will continue to monitor and intervene as needed.  ? 09/17/21 0835  ?Psych Admission Type (Psych Patients Only)  ?Admission Status Voluntary  ?Psychosocial Assessment  ?Patient Complaints Anxiety;Depression  ?Eye Contact Fair  ?Facial Expression Anxious;Sad  ?Affect Anxious;Sad  ?Speech Logical/coherent;Soft  ?Interaction Assertive  ?Motor Activity Slow  ?Appearance/Hygiene Unremarkable  ?Behavior Characteristics Cooperative;Appropriate to situation;Anxious  ?Mood Pleasant  ?Thought Process  ?Coherency WDL  ?Content WDL  ?Delusions None reported or observed  ?Perception WDL  ?Hallucination None reported or observed  ?Judgment Impaired  ?Confusion None  ?Danger to Self  ?Current suicidal ideation? Denies  ?Danger to Others  ?Danger to Others None reported or observed  ? ? ?
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
## 2021-09-17 NOTE — ED Notes (Signed)
[facility] staff member has to leave. No relief for this staff member. Sitter order placed. ?

## 2021-09-17 NOTE — ED Triage Notes (Addendum)
Sent to ED via [facility], from [facility] for eval of cp. Pt has a 1:1 sitter from [facility]. Pt is alert and oriented. States the pain is intermittent and non-radiating. Appears in nad. No pain at present. [facility] provider called Dr Jeanell Sparrow and informed pt can return once medically cleared.  ?

## 2021-09-17 NOTE — Plan of Care (Signed)
Approximately 12:15 nurse informed me that patient was complaining of chest pain with pain in his left arm.  An EKG was done which showed NSR with nonspecific T wave changes.  Given this with his symptoms he needed cardiac work up in the ED. ? ?Called Dr. Jeanell Sparrow at [facility] and she accepted the patient. Confirmed that if Cardiac Workup is negatgive the patient can be transferred back to [facility].  ? ?EMS was called and the patient was transferred to [facility]. ? ? ?Fatima Sanger MD ?Resident ? ?

## 2021-09-17 NOTE — ED Provider Notes (Signed)
[facility] EMERGENCY DEPARTMENT Provider Note   CSN: [id number] Arrival date & time: 09/17/21  1327     History  Chief Complaint  Patient presents with   Chest Pain    [name] is a 41 y.o. male.  [name] is a 41 yr old male who PMH MDD w/ psychotic features, Bipolar Disorder, polysubstance abuse (crack, alcohol), GAD, PTSD, alcohol withdrawal seizures, multiple previous suicide attempts presents with chest pain.  Patient presented from [facility] today. Patient reports central chest pain which started 7am. Onset was sudden. Radiating to the left arm. Described as sharp and pleuritic type of pain. Time/duration:intermittent. No obvious exacerbating factors or relieving factors. Severity 6 /10. Associated symptoms include sweating, dyspnea, palpitations etc.  Has had previous episodes a few weeks ago which were self limiting. Drinks 2-3 bottles a wine/day. Last drink was 6 days ago. Admits to crack and cocaine 6 days ago. Has taken morning medications toady.       Home Medications Prior to Admission medications   Medication Sig Start Date End Date Taking? Authorizing Provider  amLODipine (NORVASC) 10 MG tablet Take 1 tablet (10 mg total) by mouth daily. 04/09/21   Maudie Mercury, MD  ARIPiprazole (ABILIFY) 15 MG tablet Take 1 tablet (15 mg total) by mouth daily. Patient not taking: Reported on 09/13/2021 07/14/21   Salley Slaughter, NP  busPIRone (BUSPAR) 10 MG tablet Take 1 tablet (10 mg total) by mouth 3 (three) times daily. 07/14/21   Salley Slaughter, NP  gabapentin (NEURONTIN) 600 MG tablet Take 1 tablet (600 mg total) by mouth 3 (three) times daily. 08/05/21 11/03/21  Maudie Mercury, MD  hydrOXYzine (ATARAX) 50 MG tablet Take 1 tablet (50 mg total) by mouth 3 (three) times daily as needed. Patient taking differently: Take 50 mg by mouth 3 (three) times daily as needed for anxiety. 07/14/21   Salley Slaughter, NP  lamoTRIgine (LAMICTAL) 100 MG tablet Take 1  tablet (100 mg total) by mouth daily. Patient not taking: Reported on 09/13/2021 07/14/21   Salley Slaughter, NP  pantoprazole (PROTONIX) 40 MG tablet Take 1 tablet (40 mg total) by mouth daily. Patient not taking: Reported on 07/23/2021 01/13/21   Sherlon Handing, NP  rosuvastatin (CRESTOR) 10 MG tablet Take 1 tablet (10 mg total) by mouth daily. Patient taking differently: Take 10 mg by mouth at bedtime. 04/09/21   Maudie Mercury, MD  trazodone (DESYREL) 300 MG tablet Take 1 tablet (300 mg total) by mouth at bedtime as needed for sleep (Insomnia). 07/14/21   Salley Slaughter, NP  DULoxetine (CYMBALTA) 30 MG capsule Take 1 capsule (30 mg total) by mouth daily. Patient not taking: No sig reported 10/27/20 01/07/21  Eulis Canner E, NP      Allergies    Codeine, Penicillins, Morphine, Coconut flavor, Coconut oil, Grapefruit concentrate, Morphine and related, Oxycodone, and Norco [hydrocodone-acetaminophen]    Review of Systems   Review of Systems  Constitutional: Negative.  Negative for fever.  HENT: Negative.    Eyes: Negative.   Respiratory:  Positive for chest tightness and shortness of breath. Negative for apnea, cough and wheezing.   Cardiovascular:  Positive for chest pain and palpitations.  Gastrointestinal: Negative.   Endocrine: Negative.   Genitourinary: Negative.   Musculoskeletal: Negative.   Neurological:  Negative for dizziness, tremors and light-headedness.  Hematological: Negative.    Physical Exam Updated Vital Signs BP (!) 124/91 (BP Location: Right Arm)    Pulse 96  Temp 98.6 F (37 C) (Oral)    Resp 17    Ht 5\' 6"  (1.676 m)    Wt 72.6 kg    SpO2 97%    BMI 25.82 kg/m  Physical Exam Constitutional:      General: He is not in acute distress.    Appearance: He is well-developed.  HENT:     Head: Normocephalic and atraumatic.  Eyes:     Extraocular Movements: Extraocular movements intact.     Pupils: Pupils are equal, round, and reactive to light.   Cardiovascular:     Rate and Rhythm: Normal rate and regular rhythm.     Heart sounds: Normal heart sounds, S1 normal and S2 normal.     Comments: Tenderness on palpation of anterior chest wall. Pulmonary:     Effort: Pulmonary effort is normal.     Breath sounds: Normal breath sounds.  Chest:     Chest wall: Tenderness present.  Musculoskeletal:     Cervical back: Normal range of motion and neck supple.  Neurological:     Mental Status: He is alert.    ED Results / Procedures / Treatments   Labs (all labs ordered are listed, but only abnormal results are displayed) Labs Reviewed  BASIC METABOLIC PANEL  CBC  D-DIMER, QUANTITATIVE  TROPONIN I (HIGH SENSITIVITY)  TROPONIN I (HIGH SENSITIVITY)    EKG EKG Interpretation  Date/Time:  Thursday September 17 2021 13:36:35 EST Ventricular Rate:  94 PR Interval:  129 QRS Duration: 88 QT Interval:  365 QTC Calculation: 457 R Axis:   65 Text Interpretation: Sinus rhythm Borderline T wave abnormalities similar to previous Confirmed by Elnora Morrison [phone]) on 09/17/2021 2:18:18 PM  Radiology DG Chest 2 View  Result Date: 09/17/2021 CLINICAL DATA:  Chest pain EXAM: CHEST - 2 VIEW COMPARISON:  08/27/2021 FINDINGS: Heart size and vascularity normal. Mild bibasilar atelectasis. Upper lobes clear Plate fixation of chronic left clavicle fracture. IMPRESSION: Mild bibasilar atelectasis. Electronically Signed   By: Franchot Gallo M.D.   On: 09/17/2021 14:40    Procedures Procedures    Medications Ordered in ED Medications  alum & mag hydroxide-simeth (MAALOX/MYLANTA) 200-200-20 MG/5ML suspension 30 mL (has no administration in time range)  magnesium hydroxide (MILK OF MAGNESIA) suspension 30 mL (has no administration in time range)  thiamine (B-1) injection 100 mg (100 mg Intramuscular Patient Refused/Not Given 09/13/21 1645)  thiamine tablet 100 mg (100 mg Oral Given 09/17/21 0835)  multivitamin with minerals tablet 1 tablet (1 tablet Oral  Given 09/17/21 0835)  LORazepam (ATIVAN) tablet 1 mg (has no administration in time range)  hydrOXYzine (ATARAX) tablet 25 mg (25 mg Oral Given 09/15/21 2128)  loperamide (IMODIUM) capsule 2-4 mg (has no administration in time range)  ondansetron (ZOFRAN-ODT) disintegrating tablet 4 mg (4 mg Oral Given 09/15/21 1011)  gabapentin (NEURONTIN) capsule 600 mg (600 mg Oral Given 09/17/21 1257)  busPIRone (BUSPAR) tablet 10 mg (10 mg Oral Given 09/17/21 1257)  amLODipine (NORVASC) tablet 10 mg (10 mg Oral Given 09/17/21 0836)  pantoprazole (PROTONIX) EC tablet 40 mg (40 mg Oral Given 09/17/21 0836)  rosuvastatin (CRESTOR) tablet 10 mg (10 mg Oral Given 09/16/21 2129)  lamoTRIgine (LAMICTAL) tablet 25 mg (25 mg Oral Given 09/17/21 0835)  acetaminophen (TYLENOL) tablet 650 mg (650 mg Oral Given 09/17/21 0835)  traZODone (DESYREL) tablet 300 mg (300 mg Oral Given 09/16/21 2129)  topiramate (TOPAMAX) tablet 25 mg (25 mg Oral Given 09/17/21 0835)  ARIPiprazole (ABILIFY) tablet 5 mg (5  mg Oral Given 09/17/21 0835)  diazepam (VALIUM) 5 MG tablet (  Not Given 09/15/21 1053)  sertraline (ZOLOFT) tablet 25 mg (25 mg Oral Given 09/17/21 0836)  propranolol (INDERAL) tablet 10 mg (10 mg Oral Given 09/17/21 0955)  LORazepam (ATIVAN) tablet 1 mg (1 mg Oral Given 09/14/21 2137)    Followed by  LORazepam (ATIVAN) tablet 1 mg (1 mg Oral Given 09/15/21 1711)    Followed by  LORazepam (ATIVAN) tablet 1 mg (1 mg Oral Given 09/16/21 1708)    Followed by  LORazepam (ATIVAN) tablet 1 mg (1 mg Oral Given 09/17/21 0836)  diazepam (VALIUM) tablet 5 mg (5 mg Oral Given 09/15/21 1055)  diazepam (VALIUM) tablet 5 mg (5 mg Oral Given 09/16/21 1016)  diazepam (VALIUM) 5 MG tablet (0 mg  Duplicate 6/0/04 5997)    ED Course/ Medical Decision Making/ A&P                           Medical Decision Making [name] is a 41 yr old male who PMH MDD w/ psychotic features, Bipolar Disorder, polysubstance abuse (crack, alcohol), GAD, PTSD, alcohol withdrawal  seizures, multiple previous suicide attempts presents with chest pain. EKG with concern for abnormal q waves in V1 and poor R wave progression. This is similar to previous EKGs. Labs are still pending including troponin, d-dimer, BMP and CBC. Physical exam shows TTP anterior chest wall. Differentials are cocaine induced vasospasm, MSK related chest pain, ACS, PE etc. More likely cocaine induced vasospasm VS MSK chest pain given recent history of cocaine use and also TTP anterior chest wall. If d-dimer negative can rule out PE given low risk wells score.  Labs still pending at time of sign out. Signed out to Dr Langston Masker.  Amount and/or Complexity of Data Reviewed External Data Reviewed: ECG. Labs: ordered. Radiology: ordered. ECG/medicine tests: ordered.         Final Clinical Impression(s) / ED Diagnoses Final diagnoses:  None    Rx / DC Orders ED Discharge Orders     None         Lattie Haw, MD 09/17/21 7414    Elnora Morrison, MD 09/21/21 1556

## 2021-09-17 NOTE — ED Notes (Signed)
Report given to Morse at [facility] [place]. ?

## 2021-09-17 NOTE — Progress Notes (Signed)
?   09/17/21 0645  ?Sleep  ?Number of Hours 8.25  ? ? ?

## 2021-09-17 NOTE — ED Notes (Signed)
Got patient undressed into a gown the monitor patient is resting with call bell in reach ?

## 2021-09-17 NOTE — ED Provider Notes (Signed)
41 yo male here with central chest pain, presenting from [facility] inpatient  ? ?ECG on arrival shows NSR, no acute ischemic findings per my interpretation ? ?Pt pending trop, Ddimer ? ?Received valium at 10 AM today ?Hx of polysubstance use, cocaine use approx 5-6 days ago from signout report ? ?If acute chest pain workup negative, anticipate discharge back to facility ? ?Labs reviewed - unremarkable, ddimer negative, troponin 3 with > 6 hours of symptoms, unlikely to be ACS/PE or life-threatening emergency.  Stable for discharge back to behavioral hospital. ? ?Please note I had to manually discontinue IVC order to print discharge papers.  The patient remains under IVC. ?  ?Wyvonnia Dusky, MD ?09/17/21 1754 ? ?

## 2021-09-17 NOTE — ED Notes (Addendum)
Patient transported to XR. 

## 2021-09-17 NOTE — Discharge Instructions (Signed)
[name]'s blood tests and ECG did not show signs of life-threatening emergency for his chest pain.  He is medically cleared to return to his facility. ?

## 2021-09-18 ENCOUNTER — Encounter (HOSPITAL_COMMUNITY): Payer: Self-pay

## 2021-09-18 MED ORDER — SERTRALINE HCL 50 MG PO TABS
50.0000 mg | ORAL_TABLET | Freq: Every day | ORAL | Status: DC
  Administered 2021-09-19 – 2021-09-21 (×3): 50 mg via ORAL
  Filled 2021-09-18: qty 14
  Filled 2021-09-18 (×4): qty 1

## 2021-09-18 NOTE — [facility] Suicide Risk Assessment (Signed)
[facility] INPATIENT:  Family/Significant Other Suicide Prevention Education ? ?Suicide Prevention Education:  ?Education Completed;  [name] (mom) [phone],  (name of family member/significant other) has been identified by the patient as the family member/significant other with whom the patient will be residing, and identified as the person(s) who will aid the patient in the event of a mental health crisis (suicidal ideations/suicide attempt).  With written consent from the patient, the family member/significant other has been provided the following suicide prevention education, prior to the and/or following the discharge of the patient. ? ?The suicide prevention education provided includes the following: ?Suicide risk factors ?Suicide prevention and interventions ?National Suicide Hotline telephone number ?[facility] assessment telephone number ?[facility] Emergency Assistance 911 ?[place] and/or Residential Mobile Crisis Unit telephone number ? ?Request made of family/significant other to: ?Remove weapons (e.g., guns, rifles, knives), all items previously/currently identified as safety concern.   ?Remove drugs/medications (over-the-counter, prescriptions, illicit drugs), all items previously/currently identified as a safety concern. ? ?The family member/significant other verbalizes understanding of the suicide prevention education information provided.  The family member/significant other agrees to remove the items of safety concern listed above. ? ?"I was out of town and he called EMS. About a week prior I called EMS because he didn't know who I was but the hospital discharged him in less than 1 day. I don't think he is getting the right medications for his mental health. He has been very depressed. He was in the hospital in August because he drank hand sanitizer trying to kill himself. He ended up in the ICU and he has been living with me since he got out. He has stayed depressed.  He keeps drinking. He only works for a few days. He sleeps all day in a dark room. The hospital sends him places but he is only there for 3-4 days." Mother discussed that she arranged for him to go to facility in [place].  ? ?[name] ?09/18/2021, 10:31 AM ?

## 2021-09-18 NOTE — BH IP Treatment Plan (Signed)
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 ([place]) Active Problems:   Alcohol use disorder, severe, dependence ([place])   PTSD (post-traumatic stress disorder)   Cocaine dependence syndrome ([place])   Polysubstance abuse ([place])   Tobacco use disorder   GAD (generalized anxiety disorder)   MDD (major depressive disorder), recurrent episode, severe ([place])   Coronary artery disease   Current Medications:  Current Facility-Administered Medications  Medication Dose Route Frequency Provider Last Rate Last Admin   acetaminophen (TYLENOL) tablet 650 mg  650 mg Oral Q6H PRN Merlyn Lot E, NP   650 mg at 09/17/21 0835   alum & mag hydroxide-simeth (MAALOX/MYLANTA) 200-200-20 MG/5ML suspension 30 mL  30 mL Oral Q4H PRN Merlyn Lot E, NP       amLODipine (NORVASC) tablet 10 mg  10 mg Oral Daily Merlyn Lot E, NP   10 mg at 09/17/21 0836   ARIPiprazole (ABILIFY) tablet 5 mg  5 mg Oral Daily Briant Cedar, MD   5 mg at 09/18/21 0826   busPIRone (BUSPAR) tablet 10 mg  10 mg Oral TID Mallie Darting, NP   10 mg at 09/18/21 7824   gabapentin (NEURONTIN) capsule 600 mg  600 mg Oral TID Merlyn Lot E, NP   600 mg at 09/18/21 2353   lamoTRIgine (LAMICTAL) tablet 25 mg  25 mg Oral Daily Merlyn Lot E, NP   25 mg at 09/18/21 6144   magnesium hydroxide (MILK OF MAGNESIA) suspension 30 mL  30 mL Oral Daily PRN Mallie Darting, NP       multivitamin with minerals tablet 1 tablet  1 tablet Oral Daily Merlyn Lot E, NP   1 tablet at 09/18/21 0826   pantoprazole (PROTONIX) EC tablet 40 mg  40 mg Oral Daily Merlyn Lot E, NP   40 mg at 09/18/21 3154   propranolol (INDERAL) tablet 10 mg  10 mg Oral TID Janine Limbo, MD   10 mg at 09/18/21 0826    rosuvastatin (CRESTOR) tablet 10 mg  10 mg Oral QHS Merlyn Lot E, NP   10 mg at 09/17/21 2157   [START ON 09/19/2021] sertraline (ZOLOFT) tablet 50 mg  50 mg Oral Daily Briant Cedar, MD       thiamine (B-1) injection 100 mg  100 mg Intramuscular Once Merlyn Lot E, NP       thiamine tablet 100 mg  100 mg Oral Daily Merlyn Lot E, NP   100 mg at 09/18/21 0086   topiramate (TOPAMAX) tablet 25 mg  25 mg Oral BID Briant Cedar, MD   25 mg at 09/18/21 7619   traZODone (DESYREL) tablet 300 mg  300 mg Oral QHS PRN Bobbitt, Shalon E, NP   300 mg at 09/17/21 2157   PTA Medications: Medications Prior to Admission  Medication Sig Dispense Refill Last Dose   amLODipine (NORVASC) 10 MG tablet Take 1 tablet (10 mg total) by mouth daily. 90 tablet 3    ARIPiprazole (ABILIFY) 15 MG tablet Take 1 tablet (15 mg total) by mouth daily. (Patient not taking: Reported on 09/13/2021) 30 tablet 3    busPIRone (BUSPAR) 10 MG tablet Take 1 tablet (10 mg total) by mouth 3 (three) times daily. 90 tablet 3    gabapentin (NEURONTIN) 600 MG tablet Take 1 tablet (  600 mg total) by mouth 3 (three) times daily. 180 tablet 2    hydrOXYzine (ATARAX) 50 MG tablet Take 1 tablet (50 mg total) by mouth 3 (three) times daily as needed. (Patient taking differently: Take 50 mg by mouth 3 (three) times daily as needed for anxiety.) 90 tablet 3    lamoTRIgine (LAMICTAL) 100 MG tablet Take 1 tablet (100 mg total) by mouth daily. (Patient not taking: Reported on 09/13/2021) 30 tablet 3    pantoprazole (PROTONIX) 40 MG tablet Take 1 tablet (40 mg total) by mouth daily. (Patient not taking: Reported on 07/23/2021) 30 tablet 0    rosuvastatin (CRESTOR) 10 MG tablet Take 1 tablet (10 mg total) by mouth daily. (Patient taking differently: Take 10 mg by mouth at bedtime.) 90 tablet 3    trazodone (DESYREL) 300 MG tablet Take 1 tablet (300 mg total) by mouth at bedtime as needed for sleep (Insomnia). 30 tablet 3     Patient  Stressors: Medication change or noncompliance   Substance abuse    Patient Strengths: Hydrographic surveyor for treatment/growth  Supportive family/friends   Treatment Modalities: Medication Management, Group therapy, Case management,  1 to 1 session with clinician, Psychoeducation, Recreational therapy.   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 ([place]) Long Term Goal(s): Improvement in symptoms so as ready for discharge   Short Term Goals: Ability to identify changes in lifestyle to reduce recurrence of condition will improve Ability to verbalize feelings will improve Ability to disclose and discuss suicidal ideas Ability to demonstrate self-control will improve Ability to identify and develop effective coping behaviors will improve Compliance with prescribed medications will improve Ability to identify triggers associated with substance abuse/mental health issues will improve  Medication Management: Evaluate patient's response, side effects, and tolerance of medication regimen.  Therapeutic Interventions: 1 to 1 sessions, Unit Group sessions and Medication administration.  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 ([place]) Active Problems:   Alcohol use disorder, severe, dependence ([facility])   PTSD (post-traumatic stress disorder)   Cocaine dependence syndrome ([facility])   Polysubstance abuse ([facility])   Tobacco use disorder   GAD (generalized anxiety disorder)   MDD (major depressive disorder), recurrent episode, severe ([place])   Coronary artery disease  Long Term Goal(s): Improvement in symptoms so as ready for discharge   Short Term Goals: Ability to identify changes in lifestyle to reduce recurrence of condition will improve Ability to verbalize feelings will  improve Ability to disclose and discuss suicidal ideas Ability to demonstrate self-control will improve Ability to identify and develop effective coping behaviors will improve Compliance with prescribed medications will improve Ability to identify triggers associated with substance abuse/mental health issues will improve     Medication Management: Evaluate patient's response, side effects, and tolerance of medication regimen.  Therapeutic Interventions: 1 to 1 sessions, Unit Group sessions and Medication administration.  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 RN will report any adverse and/or side effects to prescribing provider.  Therapeutic Interventions: 1 on 1 counseling sessions, Psychoeducation, Medication administration, Evaluate responses to treatment, Monitor vital signs and CBGs as ordered, Perform/monitor CIWA, COWS, AIMS and Fall Risk screenings as ordered, Perform wound care treatments as ordered.  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  Short Term Goals: Engage patient in aftercare planning with referrals and resources, Increase  social support, and Increase ability to appropriately verbalize feelings  Therapeutic Interventions: Assess for all discharge needs, 1 to 1 time with Social worker, Explore available resources and support systems, Assess for adequacy in community support network, Educate family and significant other(s) on suicide prevention, Complete Psychosocial Assessment, Interpersonal group therapy.  Evaluation of Outcomes: Progressing   Progress in Treatment: Attending groups: No. Participating in groups: No. Taking medication as prescribed: Yes. Toleration medication: Yes. Family/Significant other contact made: Yes, individual(s) contacted:  mother Patient understands diagnosis: Yes. Discussing patient identified problems/goals with staff: Yes. Medical problems stabilized or resolved: Yes. Denies suicidal/homicidal ideation: Yes. Issues/concerns per patient self-inventory: No. Other: None  New problem(s) identified: No, Describe:  None  New Short Term/Long Term Goal(s):medication stabilization, elimination of SI thoughts, development of comprehensive mental wellness plan.   Patient Goals:  "To get my medicine right and go to long term treatment"  Discharge Plan or Barriers: Pt will go to [facility] in [place] for Substance use treatment.   Reason for Continuation of Hospitalization: Medication stabilization  Estimated Length of Stay: 3-5 days   Scribe for Treatment Team: Eliott Nine 09/18/2021 10:47 AM
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
## 2021-09-18 NOTE — BHH Counselor (Signed)
CSW spoke to [place] at [facility] in [place]. She states that they currently have room #8 held for him and are ready for him once he is discharged from [facility]. Mother will be transporting him to facility. She shared that the facility will be connecting him to substance use treatment resources and has a Scientist, product/process development for medication management on staff.  ? ?Toney Reil, LCSWA ?Clinicial Social Worker ?[place]  ?

## 2021-09-18 NOTE — Progress Notes (Signed)
?   09/18/21 1400  ?Psych Admission Type (Psych Patients Only)  ?Admission Status Voluntary  ?Psychosocial Assessment  ?Patient Complaints Anxiety  ?Eye Contact Fair  ?Facial Expression Anxious  ?Affect Appropriate to circumstance  ?Speech Logical/coherent;Soft  ?Interaction Assertive  ?Motor Activity Slow  ?Appearance/Hygiene Unremarkable  ?Behavior Characteristics Cooperative;Appropriate to situation  ?Mood Pleasant  ?Thought Process  ?Coherency WDL  ?Content WDL  ?Delusions None reported or observed  ?Perception WDL  ?Hallucination None reported or observed  ?Judgment WDL  ?Confusion None  ?Danger to Self  ?Current suicidal ideation? Denies  ?Danger to Others  ?Danger to Others None reported or observed  ? ? ?

## 2021-09-18 NOTE — Group Note (Signed)
Recreation Therapy Group Note ? ? ?Group Topic:Stress Management  ?Group Date: 09/18/2021 ?Start Time: 59 ?End Time: 0034 ?Facilitators: Victorino Sparrow, LRT,CTRS ?Location: [place] ? ? ?Goal Area(s) Addresses:  ?Patient will identify positive stress management techniques. ?Patient will identify benefits of using stress management post d/c. ? ?Group Description:  Meditation.  LRT played a mountain meditation which patients were encouraged to visualize a mountain and imagine taking on its qualities.  The meditation spoke of the mountain being able to stand through anything its faced with and still keep the qualities it has.  Patients were to listen and follow along as meditation played to fully engage in activity. ? ? ?Affect/Mood: N/A ?  ?Participation Level: Did not attend ?  ? ?Clinical Observations/Individualized Feedback:  ? ? ?Plan: Continue to engage patient in RT group sessions 2-3x/week. ? ? ?Victorino Sparrow, LRT,CTRS ?09/18/2021 11:51 AM ?

## 2021-09-18 NOTE — BHH Group Notes (Signed)
Goals group: patient attended and contributed to group. ?

## 2021-09-18 NOTE — Progress Notes (Signed)
?   09/18/21 2200  ?Psych Admission Type (Psych Patients Only)  ?Admission Status Voluntary  ?Psychosocial Assessment  ?Patient Complaints Anxiety  ?Eye Contact Fair  ?Facial Expression Anxious  ?Affect Appropriate to circumstance  ?Speech Logical/coherent  ?Interaction Assertive  ?Motor Activity Slow  ?Appearance/Hygiene Unremarkable  ?Behavior Characteristics Cooperative  ?Mood Pleasant  ?Aggressive Behavior  ?Effect No apparent injury  ?Thought Process  ?Coherency WDL  ?Content WDL  ?Delusions WDL  ?Perception WDL  ?Hallucination None reported or observed  ?Judgment WDL  ?Confusion None  ?Danger to Self  ?Current suicidal ideation? Denies  ?Danger to Others  ?Danger to Others None reported or observed  ? ? ?

## 2021-09-18 NOTE — Group Note (Signed)
[name] LCSW Group Therapy Note ? ?Date/Time: 09/18/2021 @ 1pm ? ?Type of Therapy and Topic:  Group Therapy:  Who Am I?  Self Esteem, Self-Actualization and Understanding Self. ? ?Participation Level:  Did not attend ? ?Description of Group:   ? In this group patients will be asked to explore values, beliefs, truths, and morals as they relate to personal self.  Patients will be guided to discuss their thoughts, feelings, and behaviors related to what they identify as important to their true self. Patients will process together how values, beliefs and truths are connected to specific choices patients make every day. Each patient will be challenged to identify changes that they are motivated to make in order to improve self-esteem and self-actualization. This group will be process-oriented, with patients participating in exploration of their own experiences as well as giving and receiving support and challenge from other group members. ? ?Therapeutic Goals: ?Patient will identify false beliefs that currently interfere with their self-esteem.  ?Patient will identify feelings, thought process, and behaviors related to self and will become aware of the uniqueness of themselves and of others.  ?Patient will be able to identify and verbalize values, morals, and beliefs as they relate to self. ?Patient will begin to learn how to build self-esteem/self-awareness by expressing what is important and unique to them personally. ? ?Summary of Patient Progress: Did not attend ? ? ?Therapeutic Modalities:   ?Cognitive Behavioral Therapy ?Solution Focused Therapy ?Motivational Interviewing ?Brief Therapy ? ? ?Karrah Mangini, LCSW, LCAS ?Clincal Social Worker  ?[facility] ? ? ?

## 2021-09-19 LAB — LIPID PANEL
Cholesterol: 273 mg/dL — ABNORMAL HIGH (ref 0–200)
HDL: 56 mg/dL (ref >40)
LDL Cholesterol: 184 mg/dL — ABNORMAL HIGH (ref 0–99)
Total CHOL/HDL Ratio: 4.9 RATIO
Triglycerides: 165 mg/dL — ABNORMAL HIGH (ref <150)
VLDL: 33 mg/dL (ref 0–40)

## 2021-09-19 LAB — TSH: TSH: 1.994 u[IU]/mL (ref 0.350–4.500)

## 2021-09-19 LAB — HEMOGLOBIN A1C
Hgb A1c MFr Bld: 4.5 % — ABNORMAL LOW (ref 4.8–5.6)
Mean Plasma Glucose: 82.45 mg/dL

## 2021-09-19 MED ORDER — TRAZODONE HCL 150 MG PO TABS
300.0000 mg | ORAL_TABLET | Freq: Every evening | ORAL | Status: DC | PRN
  Administered 2021-09-19 – 2021-09-20 (×2): 300 mg via ORAL
  Filled 2021-09-19: qty 2
  Filled 2021-09-19: qty 28
  Filled 2021-09-19: qty 2

## 2021-09-19 MED ORDER — OMEGA-3-ACID ETHYL ESTERS 1 G PO CAPS
1.0000 g | ORAL_CAPSULE | Freq: Every day | ORAL | Status: DC
Start: 2021-09-19 — End: 2021-09-21
  Administered 2021-09-19 – 2021-09-21 (×3): 1 g via ORAL
  Filled 2021-09-19 (×2): qty 1
  Filled 2021-09-19: qty 14
  Filled 2021-09-19: qty 1

## 2021-09-19 NOTE — Progress Notes (Signed)
?   09/19/21 0500  ?Sleep  ?Number of Hours 6  ? ? ?

## 2021-09-19 NOTE — BHH Group Notes (Signed)
Pt did not attend orientation/goals group. 

## 2021-09-19 NOTE — Progress Notes (Signed)
Pt upset initially that Trazodone had been discontinued.  Pt has been on 300 mg at night for some time.  Reviewed note and saw no reason, contacted NP and got order for tonight.  Pt relieved, states "I would not have been able to sleep a wink".  Pt positive for evening wrap up group, no other complaints voiced.   ? ? ? 09/19/21 2304  ?Psych Admission Type (Psych Patients Only)  ?Admission Status Voluntary  ?Psychosocial Assessment  ?Patient Complaints Anxiety  ?Eye Contact Fair  ?Facial Expression Anxious  ?Affect Appropriate to circumstance  ?Speech Logical/coherent  ?Interaction Assertive  ?Motor Activity Other (Comment) ?(WDL)  ?Appearance/Hygiene Unremarkable  ?Behavior Characteristics Appropriate to situation  ?Mood Anxious;Pleasant  ?Thought Process  ?Coherency WDL  ?Content WDL  ?Delusions None reported or observed  ?Perception WDL  ?Hallucination None reported or observed  ?Judgment Impaired  ?Confusion None  ?Danger to Self  ?Current suicidal ideation? Denies  ?Danger to Others  ?Danger to Others None reported or observed  ? ? ?
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
## 2021-09-19 NOTE — [facility] Group Notes (Signed)
Psychoeducational Group Note ? ? ? ?Date:09/19/21 ?Time: 1300-1400 ? ? ? ?Purpose of Group: . The group focus' on teaching patients on how to identify their needs and their Life Skills:  A group where two lists are made. What people need and what are things that we do that are unhealthy. The lists are developed by the patients and it is explained that we often do the actions that are not healthy to get our list of needs met. ? ?Goal:: to develop the coping skills needed to get their needs met ? ?Participation Level:  Active ? ?Participation Quality:  Appropriate ? ?Affect:  Appropriate ? ?Cognitive:  Oriented ? ?Insight:  Improving ? ?Engagement in Group:  Engaged ? ?Additional Comments: Pt rates his energy at a 10/10. Participated fully in the group. ? ?Bryson Dames A ? ?

## 2021-09-19 NOTE — Progress Notes (Signed)
Pt A&OX4, pt denies ideations and hallucinations. Pt denies pain. Pt c/o anxiety level "3/10." Pt is pleasant and cooperative with medications. Pt slept through group therapy.  ?

## 2021-09-19 NOTE — [facility] Group Notes (Signed)
Pt did not attend in orientation/goals group.  ?

## 2021-09-19 NOTE — B[facility] Counselor (Addendum)
Clinical Social Work Note ? ?CSW called and left a message for [facility] staff to clarify when the patient has to arrive at their program in [place] on Monday, to ask about medication needs, and such, asked for call back. ? ?CSW called and spoke with patient's mother [name] [phone] who stated she will pick the patient up at 9:30am on Monday 3/6.  He needs a 30-day supply of ALL medicines to get through the period of time of the facility getting him set up with local medication management and therapy.   ? ?Selmer Dominion, LCSW ?09/19/2021, 10:45 AM ?  ?

## 2021-09-19 NOTE — [facility] Group Notes (Signed)
Adult Psychoeducational Group Note ? ?Date:  09/19/2021 ?Time:  10:29 PM ? ?Group Topic/Focus:  ?Wrap-Up Group:   The focus of this group is to help patients review their daily goal of treatment and discuss progress on daily workbooks. ? ?Participation Level:  Did Not Attend ? ?Participation Quality:   ? ?Affect:   ? ?Cognitive:   ? ?Insight:  ? ?Engagement in Group:   ? ?Modes of Intervention:   ? ?Additional Comments:  Pt was invited to attend group and declined. ? ?Cleatis Polka ?09/19/2021, 10:29 PM ?

## 2021-09-19 NOTE — Group Note (Signed)
LCSW Group Therapy Note ? ?09/19/2021   10:00-11:00am  ? ?Type of Therapy and Topic:  Group Therapy: Anger Cues and Responses ? ?Participation Level:  Did Not Attend ? ? ?Description of Group:   ?In this group, patients learned how to recognize the physical, cognitive, emotional, and behavioral responses they have to anger-provoking situations.  They identified a recent time they became angry and how they reacted.  They analyzed how their reaction was possibly beneficial and how it was possibly unhelpful.  The group discussed a variety of healthier coping skills that could help with such a situation in the future.  Focus was placed on how helpful it is to recognize the underlying emotions to our anger, because working on those can lead to a more permanent solution as well as our ability to focus on the important rather than the urgent. ? ?Therapeutic Goals: ?Patients will remember their last incident of anger and how they felt emotionally and physically, what their thoughts were at the time, and how they behaved. ?Patients will identify how their behavior at that time worked for them, as well as how it worked against them. ?Patients will explore possible new behaviors to use in future anger situations. ?Patients will learn that anger itself is normal and cannot be eliminated, and that healthier reactions can assist with resolving conflict rather than worsening situations. ? ?Summary of Patient Progress:  The patient was invited to group. Patient did not attend.  ? ?Therapeutic Modalities:   ?Cognitive Behavioral Therapy ? ?Read Drivers   ?Read Drivers, LCSWA ?09/19/2021  12:46 PM   ? ?

## 2021-09-19 NOTE — [facility] Group Notes (Signed)
Goals Group ?3/4//2023 ? ? ?Group Focus: affirmation, clarity of thought, and goals/reality orientation ?Treatment Modality:  Psychoeducation ?Interventions utilized were assignment, group exercise, and support ?Purpose: To be able to understand and verbalize the reason for their admission to the hospital. To understand that the medication helps with their chemical imbalance but they also need to work on their choices in life. To be challenged to develop [name] list of 30 positives about themselves. Also introduce the concept that "feelings" are not reality. ? ?Participation Level:  did not attend ? ?[name] ?

## 2021-09-20 MED ORDER — ADULT MULTIVITAMIN W/MINERALS CH
1.0000 | ORAL_TABLET | Freq: Every day | ORAL | 0 refills | Status: AC
Start: 2021-09-21 — End: 2021-10-21

## 2021-09-20 MED ORDER — ARIPIPRAZOLE 5 MG PO TABS: 5.0000 mg | ORAL_TABLET | Freq: Every day | ORAL | 0 refills | Status: DC

## 2021-09-20 MED ORDER — OMEGA-3-ACID ETHYL ESTERS 1 G PO CAPS: 1.0000 g | ORAL_CAPSULE | Freq: Every day | ORAL | 0 refills | Status: DC

## 2021-09-20 MED ORDER — THIAMINE HCL 100 MG PO TABS: 100.0000 mg | ORAL_TABLET | Freq: Every day | ORAL | 0 refills | Status: AC

## 2021-09-20 MED ORDER — GABAPENTIN 300 MG PO CAPS
600.0000 mg | ORAL_CAPSULE | Freq: Three times a day (TID) | ORAL | 0 refills | Status: DC
Start: 2021-09-20 — End: 2021-12-19

## 2021-09-20 MED ORDER — SERTRALINE HCL 50 MG PO TABS
50.0000 mg | ORAL_TABLET | Freq: Every day | ORAL | 0 refills | Status: DC
Start: 2021-09-21 — End: 2021-12-19

## 2021-09-20 MED ORDER — TOPIRAMATE 25 MG PO TABS: 25.0000 mg | ORAL_TABLET | Freq: Two times a day (BID) | ORAL | 0 refills | Status: DC

## 2021-09-20 MED ORDER — ROSUVASTATIN CALCIUM 10 MG PO TABS: 10.0000 mg | ORAL_TABLET | Freq: Every day | ORAL | 0 refills | Status: DC

## 2021-09-20 MED ORDER — LAMOTRIGINE 25 MG PO TABS: 25.0000 mg | ORAL_TABLET | Freq: Every day | ORAL | 0 refills | Status: DC

## 2021-09-20 MED ORDER — PROPRANOLOL HCL 10 MG PO TABS: 10.0000 mg | ORAL_TABLET | Freq: Three times a day (TID) | ORAL | 0 refills | Status: DC

## 2021-09-20 MED ORDER — THIAMINE HCL 100 MG PO TABS: 100.0000 mg | ORAL_TABLET | Freq: Every day | ORAL | 0 refills | Status: DC

## 2021-09-20 NOTE — [facility] Group Notes (Signed)
Adult Psychoeducational Group Not ?Date:  09/20/2021 ?Time:  9741-6384 ?Group Topic/Focus: PROGRESSIVE RELAXATION. A group where deep breathing is taught and tensing and relaxation muscle groups is used. Imagery is used as well.  Pts are asked to imagine 3 pillars that hold them up when they are not able to hold themselves up and to share that with the group. ? ?Participation Level:  Active ? ?Participation Quality:  Appropriate ? ?Affect:  Appropriate ? ?Cognitive:  Oriented ? ?Insight: Improving ? ?Engagement in Group:  Engaged ? ?Modes of Intervention:  Activity, Discussion, Education, and Support ? ?Additional Comments:  Pt rates his energy as a 10/10. States what holds him up and supports him is family, [name] and his boldness. ? ?[name] A ? ?

## 2021-09-20 NOTE — [facility] Group Notes (Signed)
[place] [facility] Group Notes:  (Nursing/MHT/Case Management/Adjunct) ? ?Date:  09/20/2021  ?Time:  8:48 PM ? ?Type of Therapy:   Wrap up ? ?Participation Level:  Active ? ?Participation Quality:  Appropriate and Attentive ? ?Affect:  Appropriate ? ?Cognitive:  Alert and Appropriate ? ?Insight:  Appropriate, Good, and Improving ? ?Engagement in Group:  Developing/Improving ? ?Modes of Intervention:  Discussion ? ?Summary of Progress/Problems: pt attended group in a positive mood. PT said that he is looking forward to going to a new program. ? ?Maxine Glenn ?09/20/2021, 8:48 PM ?
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
## 2021-09-20 NOTE — Progress Notes (Signed)
Pt denies SI/HI/AVH and verbally agrees to approach staff if these become apparent or before harming themselves/others. Rates depression 0/10. Rates anxiety 0/10. Rates pain 0/10. Pt stated that he was a 10/10 today and that he was feeling great. Pt stated that he enjoyed going outside. Scheduled medications administered to pt, per MD orders. RN provided support and encouragement to pt. Q15 min safety checks implemented and continued. Pt safe on the unit. RN will continue to monitor and intervene as needed.  ? 09/20/21 4163  ?Psych Admission Type (Psych Patients Only)  ?Admission Status Voluntary  ?Psychosocial Assessment  ?Patient Complaints None  ?Eye Contact Fair  ?Facial Expression Animated  ?Affect Appropriate to circumstance  ?Speech Logical/coherent  ?Interaction Assertive  ?Motor Activity Other (Comment) ?(WDL)  ?Appearance/Hygiene Unremarkable  ?Behavior Characteristics Appropriate to situation;Cooperative;Calm  ?Mood Pleasant;Euthymic  ?Thought Process  ?Coherency WDL  ?Content WDL  ?Delusions None reported or observed  ?Perception WDL  ?Hallucination None reported or observed  ?Judgment Impaired  ?Confusion None  ?Danger to Self  ?Current suicidal ideation? Denies  ?Danger to Others  ?Danger to Others None reported or observed  ? ? ?

## 2021-09-20 NOTE — Group Note (Signed)
?  BHH/BMU LCSW Group Therapy Note ? ?Date/Time:  09/20/2021 10:00AM-11:00AM ? ?Type of Therapy and Topic:  Group Therapy:  Self-Care after Hospitalization ? ?Participation Level:  Active  ? ?Description of Group ?This process group involved patients discussing how they plan to take care of themselves in a better manner when they get home from the hospital.  The group started with patients listing one healthy and one unhealthy way they took care of themselves prior to hospitalization.  A discussion ensued about the differences in healthy and unhealthy coping skills.  Group members shared ideas about making changes when they return home so that they can stay well and in recovery.  The white board was used to list ideas so that patients can continue to see these ideas throughout the day. ? ?Therapeutic Goals ?Patient will identify and describe one healthy and one unhealthy coping technique used prior to hospitalization ?Patient will participate in generating ideas about healthy self-care options when they return to the community ?Patients will be supportive of one another and receive said support from others ?Patient will identify one healthy self-care activity to add to his/her post-hospitalization life that can help in recovery ? ?Summary of Patient Progress:  The patient expressed that prior to hospitalization some healthy self-care activity that he engaged in was waking outside, while unhealthy self-care activity included drinking.  [name] participation in group was beneficial.   Patient identified resting and going to another treatment facility as another self-care activity to add in his pursuit of recovery post-discharge. ? ? ?Therapeutic Modalities ?Brief Solution-Focused Therapy ?Motivational Interviewing ?Psychoeducation ? ?   ?Read Drivers, LCSWA ?09/20/2021  12:48 PM   ? ?
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
## 2021-09-20 NOTE — Progress Notes (Signed)
?   09/20/21 2300  ?Psych Admission Type (Psych Patients Only)  ?Admission Status Voluntary  ?Psychosocial Assessment  ?Patient Complaints None  ?Eye Contact Fair  ?Facial Expression Animated  ?Affect Appropriate to circumstance  ?Speech Logical/coherent  ?Interaction Assertive  ?Motor Activity Other (Comment) ?(wdl)  ?Appearance/Hygiene Unremarkable  ?Behavior Characteristics Cooperative  ?Mood Pleasant  ?Thought Process  ?Coherency WDL  ?Content WDL  ?Delusions None reported or observed  ?Perception WDL  ?Hallucination None reported or observed  ?Judgment WDL  ?Confusion None  ?Danger to Self  ?Current suicidal ideation? Denies  ?Danger to Others  ?Danger to Others None reported or observed  ? ?D: Patient in dayroom watching TV and interacting well with peers. Pt stated he is looking forward to discharge and heading to the treatment center. ?A: Medications administered as prescribed. Support and encouragement provided as needed.  ?R: Patient remains safe on the unit. Will continue to monitor for safety and stability.   ?

## 2021-09-21 MED ORDER — PANTOPRAZOLE SODIUM 40 MG PO TBEC
40.0000 mg | DELAYED_RELEASE_TABLET | Freq: Every day | ORAL | 0 refills | Status: DC
Start: 2021-09-21 — End: 2021-12-14

## 2021-09-21 MED ORDER — GABAPENTIN 600 MG PO TABS
600.0000 mg | ORAL_TABLET | Freq: Three times a day (TID) | ORAL | Status: DC
  Filled 2021-09-21 (×3): qty 42

## 2021-09-21 MED ORDER — BUSPIRONE HCL 10 MG PO TABS: 10.0000 mg | ORAL_TABLET | Freq: Three times a day (TID) | ORAL | 0 refills | Status: DC

## 2021-09-21 MED ORDER — AMLODIPINE BESYLATE 10 MG PO TABS: 10.0000 mg | ORAL_TABLET | Freq: Every day | ORAL | 0 refills | Status: DC

## 2021-09-21 MED ORDER — TRAZODONE HCL 300 MG PO TABS: 300.0000 mg | ORAL_TABLET | Freq: Every evening | ORAL | 0 refills | Status: DC | PRN

## 2021-09-21 NOTE — BHH Group Notes (Signed)
Adult Psychoeducational Group Note ? ?Date:  09/21/2021 ?Time:  9:16 AM ? ?Group Topic/Focus:  ?Goals Group:   The focus of this group is to help patients establish daily goals to achieve during treatment and discuss how the patient can incorporate goal setting into their daily lives to aide in recovery. ? ?Participation Level:  Active ? ?Participation Quality:  Appropriate ? ?Affect:  Appropriate ? ?Cognitive:  Appropriate ? ?Insight: Appropriate ? ?Engagement in Group:  Engaged ? ?Modes of Intervention:  Discussion ? ?Additional Comments:  Pt stated that he was leaving today and his goal is to trust himself in a new place and being alone in a new place would be a new challenge. ? ?Dub Mikes ?09/21/2021, 9:16 AM ?

## 2021-09-21 NOTE — Progress Notes (Signed)
?  [facility] Adult Case Management Discharge Plan : ? ?Will you be returning to the same living situation after discharge:  No. Will be going to [facility] in [place] for further treatment  ?At discharge, do you have transportation home?: Yes,  mother to pick this patient up ?Do you have the ability to pay for your medications: Yes,  has insurance ? ?Release of information consent forms completed and in the chart;  [name] signature needed at discharge. ? ?Patient to Follow up at: ? Follow-up Information   ? ? [place]. Go to.   ?Specialty: Behavioral Health ?Why: Please go to this provider for therapy and medication management services during walk in hours:  Monday through Wednesday, from 7:45 am to 11:00 am.  Services are provided on a first come, first served basis. ?Contact information: ?[address] ?[place] [address] ?[phone] ? ?  ?  ? ? [facility] Follow up.   ?Why: You have been accepted to this facility for substance use treatment and medication managment services. ?Contact information: ?[place], [place] [address] [phone] ? ?  ?  ? ?  ?  ? ?  ? ? ?Next level of care provider has access to [facility] ? ?Safety Planning and Suicide Prevention discussed: Yes,  with mother ? ?  ? ?Has patient been referred to the Quitline?: Patient refused referral ? ?Patient has been referred for addiction treatment: Yes ? ?Vassie Moselle, LCSW ?09/21/2021, 8:52 AM ?
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
## 2021-09-21 NOTE — Progress Notes (Signed)
D: Pt A & O X 4. Denies SI, HI, AVH and pain at this time. D/C home as ordered. Picked up in lobby by "my mom". ?A: D/C instructions reviewed with pt including prescriptions, medication samples and follow up appointments; compliance encouraged. All belongings from locker 15 returned to pt at time of departure. Scheduled medications given with verbal education and effects monitored. Safety checks maintained without incident till time of d/c.  ?R: Pt receptive to care. Compliant with medications when offered. Denies adverse drug reactions when assessed. Verbalized understanding related to d/c instructions. Signed belonging sheet in agreement with items received from locker. Ambulatory with a steady gait. Appears to be in no physical distress at time of departure.  ? ?

## 2021-09-29 ENCOUNTER — Telehealth (HOSPITAL_COMMUNITY): Payer: 59 | Admitting: Psychiatry

## 2021-10-17 ENCOUNTER — Encounter (HOSPITAL_COMMUNITY): Payer: Self-pay | Admitting: Emergency Medicine

## 2021-10-17 ENCOUNTER — Other Ambulatory Visit: Payer: Self-pay

## 2021-10-17 ENCOUNTER — Emergency Department (HOSPITAL_COMMUNITY)
Admission: EM | Admit: 2021-10-17 | Discharge: 2021-10-17 | Disposition: A | Discharge status: Home / Self Care | Payer: 59 | Attending: Emergency Medicine | Admitting: Emergency Medicine

## 2021-10-17 ENCOUNTER — Emergency Department (HOSPITAL_COMMUNITY): Payer: 59

## 2021-10-17 DIAGNOSIS — R300 Dysuria: Secondary | ICD-10-CM | POA: Insufficient documentation

## 2021-10-17 DIAGNOSIS — R1032 Left lower quadrant pain: Secondary | ICD-10-CM | POA: Insufficient documentation

## 2021-10-17 LAB — URINALYSIS, ROUTINE W REFLEX MICROSCOPIC
Bilirubin Urine: NEGATIVE
Glucose, UA: NEGATIVE mg/dL
Hgb urine dipstick: NEGATIVE
Ketones, ur: NEGATIVE mg/dL
Nitrite: NEGATIVE
Protein, ur: NEGATIVE mg/dL
Specific Gravity, Urine: 1.019 (ref 1.005–1.030)
pH: 5 (ref 5.0–8.0)

## 2021-10-17 MED ORDER — OXYCODONE-ACETAMINOPHEN 5-325 MG PO TABS
1.0000 | ORAL_TABLET | Freq: Once | ORAL | Status: AC
  Administered 2021-10-17: 1 via ORAL
  Filled 2021-10-17: qty 1

## 2021-10-17 MED ORDER — CEPHALEXIN 500 MG PO CAPS: 500.0000 mg | ORAL_CAPSULE | Freq: Four times a day (QID) | ORAL | 0 refills | Status: DC

## 2021-10-17 MED ORDER — OXYCODONE-ACETAMINOPHEN 5-325 MG PO TABS: 1.0000 | ORAL_TABLET | Freq: Four times a day (QID) | ORAL | 0 refills | Status: DC | PRN

## 2021-10-17 NOTE — ED Provider Triage Note (Signed)
Emergency Medicine Provider Triage Evaluation Note ? ?[name] , a 41 y.o. male  was evaluated in triage.  Pt complains of left groin pain that began yesterday after work and has progressively worsened. Describes it as constant and sharp. Also complains of dysuria. No hematuria. Denies swelling of penis or testicles. No fevers.  ? ?Review of Systems  ?Positive: See above ?Negative:  ? ?Physical Exam  ?BP 120/90 (BP Location: Left Arm)   Pulse 91   Temp 98.6 ?F (37 ?C) (Oral)   Resp 18   Ht '5\' 6"'$  (1.676 m)   Wt 72.6 kg   SpO2 98%   BMI 25.83 kg/m?  ?Gen:   Awake, no distress   ?Resp:  Normal effort  ?MSK:   Moves extremities without difficulty  ?Other:  No CVAT. TTP of the left testicle. Does not appear twisted. No swelling, erythema (chaperone present during GU exam) ? ?Medical Decision Making  ?Medically screening exam initiated at 9:31 AM.  Appropriate orders placed.  [name] was informed that the remainder of the evaluation will be completed by another provider, this initial triage assessment does not replace that evaluation, and the importance of remaining in the ED until their evaluation is complete. ? ? ?  ?[name] Hillier, PA-C ?10/17/21 0935 ? ?

## 2021-10-17 NOTE — ED Provider Notes (Signed)
?[facility] DEPT ?Provider Note ? ? ?CSN: [id number] ?Arrival date & time: 10/17/21  0906 ? ?  ? ?History ? ?No chief complaint on file. ? ? ?[name] is a 41 y.o. male.  He is here with a complaint of left groin pain that started yesterday.  Worse with movement bending lifting.  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  He states he feels a little bit of dysuria and radiation into left testicle.  He denies any recent sexual contacts in over a month.  No fevers chills nausea vomiting.  No abdominal pain.  No discharge or sores. ? ?The history is provided by the patient.  ?Groin Pain ?This is a new problem. The current episode started yesterday. The problem occurs constantly. The problem has not changed since onset.Pertinent negatives include no chest pain, no abdominal pain, no headaches and no shortness of breath. The symptoms are aggravated by bending, twisting and walking. Nothing relieves the symptoms. He has tried rest for the symptoms. The treatment provided no relief.  ? ?  ? ?Home Medications ?Prior to Admission medications   ?Medication Sig Start Date End Date Taking? Authorizing Provider  ?amLODipine (NORVASC) 10 MG tablet Take 1 tablet (10 mg total) by mouth daily. 09/21/21   Massengill, Ovid Curd, MD  ?ARIPiprazole (ABILIFY) 5 MG tablet Take 1 tablet (5 mg total) by mouth daily. 09/21/21 10/21/21  Delfin Gant, NP  ?busPIRone (BUSPAR) 10 MG tablet Take 1 tablet (10 mg total) by mouth 3 (three) times daily. 09/21/21   Massengill, Ovid Curd, MD  ?gabapentin (NEURONTIN) 300 MG capsule Take 2 capsules (600 mg total) by mouth 3 (three) times daily. 09/20/21 10/20/21  Delfin Gant, NP  ?lamoTRIgine (LAMICTAL) 25 MG tablet Take 1 tablet (25 mg total) by mouth daily. 09/21/21 10/21/21  Delfin Gant, NP  ?Multiple Vitamin (MULTIVITAMIN WITH MINERALS) TABS tablet Take 1 tablet by mouth daily. 09/21/21 10/21/21  Delfin Gant, NP  ?omega-3 acid ethyl esters  (LOVAZA) 1 g capsule Take 1 capsule (1 g total) by mouth daily. 09/21/21 10/21/21  Delfin Gant, NP  ?pantoprazole (PROTONIX) 40 MG tablet Take 1 tablet (40 mg total) by mouth daily. 09/21/21   Massengill, Ovid Curd, MD  ?propranolol (INDERAL) 10 MG tablet Take 1 tablet (10 mg total) by mouth 3 (three) times daily. 09/20/21 10/20/21  Delfin Gant, NP  ?rosuvastatin (CRESTOR) 10 MG tablet Take 1 tablet (10 mg total) by mouth at bedtime. 09/20/21 10/20/21  Delfin Gant, NP  ?sertraline (ZOLOFT) 50 MG tablet Take 1 tablet (50 mg total) by mouth daily. 09/21/21 10/21/21  Delfin Gant, NP  ?topiramate (TOPAMAX) 25 MG tablet Take 1 tablet (25 mg total) by mouth 2 (two) times daily. 09/20/21 10/20/21  Delfin Gant, NP  ?trazodone (DESYREL) 300 MG tablet Take 1 tablet (300 mg total) by mouth at bedtime as needed for sleep (Insomnia). 09/21/21   Massengill, Ovid Curd, MD  ?DULoxetine (CYMBALTA) 30 MG capsule Take 1 capsule (30 mg total) by mouth daily. ?Patient not taking: No sig reported 10/27/20 01/07/21  Salley Slaughter, NP  ?   ? ?Allergies    ?Codeine, Penicillins, Morphine, Coconut flavor, Coconut oil, Grapefruit concentrate, Morphine and related, Oxycodone, and Norco [hydrocodone-acetaminophen]   ? ?Review of Systems   ?Review of Systems  ?Constitutional:  Negative for fever.  ?HENT:  Negative for sore throat.   ?Respiratory:  Negative for shortness of breath.   ?  Cardiovascular:  Negative for chest pain.  ?Gastrointestinal:  Negative for abdominal pain.  ?Genitourinary:  Positive for dysuria. Negative for genital sores, penile pain, scrotal swelling and testicular pain.  ?Musculoskeletal:  Negative for back pain.  ?Skin:  Negative for rash.  ?Neurological:  Negative for headaches.  ? ?Physical Exam ?Updated Vital Signs ?BP 120/90 (BP Location: Left Arm)   Pulse 91   Temp 98.6 ?F (37 ?C) (Oral)   Resp 18   Ht '5\' 6"'$  (1.676 m)   Wt 72.6 kg   SpO2 98%   BMI 25.83 kg/m?  ?Physical Exam ?Vitals and nursing  note reviewed.  ?Constitutional:   ?   General: He is not in acute distress. ?   Appearance: Normal appearance. He is well-developed.  ?HENT:  ?   Head: Normocephalic and atraumatic.  ?Eyes:  ?   Conjunctiva/sclera: Conjunctivae normal.  ?Cardiovascular:  ?   Rate and Rhythm: Normal rate and regular rhythm.  ?   Heart sounds: No murmur heard. ?Pulmonary:  ?   Effort: Pulmonary effort is normal. No respiratory distress.  ?   Breath sounds: Normal breath sounds.  ?Abdominal:  ?   Palpations: Abdomen is soft.  ?   Tenderness: There is no abdominal tenderness.  ?   Hernia: No hernia is present.  ?Genitourinary: ?   Penis: Normal.   ?   Testes: Normal.  ?Musculoskeletal:     ?   General: No swelling.  ?   Cervical back: Neck supple.  ?Skin: ?   General: Skin is warm and dry.  ?   Capillary Refill: Capillary refill takes less than 2 seconds.  ?Neurological:  ?   General: No focal deficit present.  ?   Mental Status: He is alert.  ? ? ?ED Results / Procedures / Treatments   ?Labs ?(all labs ordered are listed, but only abnormal results are displayed) ?Labs Reviewed  ?URINALYSIS, ROUTINE W REFLEX MICROSCOPIC - Abnormal; Notable for the following components:  ?    Result Value  ? Leukocytes,Ua TRACE (*)   ? Bacteria, UA MANY (*)   ? All other components within normal limits  ?GC/CHLAMYDIA PROBE AMP ([place]) NOT AT [facility]  ? ? ?EKG ?None ? ?Radiology ?US SCROTUM W/DOPPLER ? ?Result Date: 10/17/2021 ?CLINICAL DATA:  Testicular pain EXAM: SCROTAL ULTRASOUND DOPPLER ULTRASOUND OF THE TESTICLES TECHNIQUE: Complete ultrasound examination of the testicles, epididymis, and other scrotal structures was performed. Color and spectral Doppler ultrasound were also utilized to evaluate blood flow to the testicles. COMPARISON:  None. FINDINGS: Right testicle Measurements: 3.0 x 1.7 x 2.3 cm. No mass or microlithiasis visualized. Left testicle Measurements: 3.7 x 1.7 x 2.2 cm. No mass or microlithiasis visualized. Right epididymis:  Normal  in size and appearance. Left epididymis:  Normal in size and appearance. Hydrocele:  None visualized. Varicocele:  None visualized. Pulsed Doppler interrogation of both testes demonstrates normal low resistance arterial and venous waveforms bilaterally. IMPRESSION: Normal testicular sonogram. No evidence for testicular mass or torsion. Electronically Signed   By: Kerby Moors M.D.   On: 10/17/2021 10:56   ? ?Procedures ?Procedures  ? ? ?Medications Ordered in ED ?Medications  ?oxyCODONE-acetaminophen (PERCOCET/ROXICET) 5-325 MG per tablet 1 tablet (has no administration in time range)  ? ? ?ED Course/ Medical Decision Making/ A&P ?  ?                        ?Medical Decision Making ?Amount and/or Complexity of  Data Reviewed ?Labs: ordered. ? ?Risk ?Prescription drug management. ? ?This patient complains of left groin pain; this involves an extensive number of treatment ?Options and is a complaint that carries with it a high risk of complications and ?morbidity. The differential includes musculoskeletal pain, hernia, incarceration, epididymitis, torsion, orchitis, UTI, renal colic ? ?I ordered, reviewed and interpreted labs, which included urinalysis with possible infection 11-20 whites, many bacteria nitrite negative ?I ordered medication oral pain medication and reviewed PMP when indicated. ?I ordered imaging studies which included scrotal ultrasound and I independently ?   visualized and interpreted imaging which showed no evidence of torsion or epididymitis ?Previous records obtained and reviewed in epic, patient frequent visitor to the emergency department ?Social determinants considered, no significant barriers ?Critical Interventions: None ? ?After the interventions stated above, I reevaluated the patient and found patient to be symptomatically improved ?Admission and further testing considered, no indications for admission at this time.  Will cover with antibiotics and pain medication.  Recommended  follow-up with PCP.  Return instructions discussed.  Patient states he is fine to take Percocet and that it is not a true allergy as listed on his medication list. ? ? ? ? ? ? ? ? ? ?Final Clinical Impression(s) / ED Dia

## 2021-10-17 NOTE — Discharge Instructions (Signed)
You were seen in the emergency department for left groin pain.  You had an ultrasound that did not show any evidence of testicular torsion or mass.  Your urinalysis showed possible infection.  We are treating you with antibiotics and pain medication.  Please follow-up with your primary care doctor.  Return to the emergency department if any worsening or concerning symptoms. ?

## 2021-10-17 NOTE — ED Triage Notes (Signed)
Patient reports working for the past 3 weeks, got home last night and noticed he had L groin pain. Denies groin swelling, testicle swelling but reports some burning w/ urination.  ?

## 2021-10-19 LAB — GC/CHLAMYDIA PROBE AMP (~~LOC~~) NOT AT ARMC
Chlamydia: NEGATIVE
Comment: NEGATIVE
Comment: NORMAL
Neisseria Gonorrhea: NEGATIVE

## 2021-10-28 ENCOUNTER — Encounter (HOSPITAL_COMMUNITY): Payer: Self-pay | Admitting: *Deleted

## 2021-10-28 ENCOUNTER — Emergency Department (HOSPITAL_COMMUNITY): Payer: 59

## 2021-10-28 ENCOUNTER — Emergency Department (HOSPITAL_COMMUNITY)
Admission: EM | Admit: 2021-10-28 | Discharge: 2021-10-29 | Disposition: A | Discharge status: Home / Self Care | Payer: 59 | Attending: Emergency Medicine | Admitting: Emergency Medicine

## 2021-10-28 DIAGNOSIS — I10 Essential (primary) hypertension: Secondary | ICD-10-CM | POA: Insufficient documentation

## 2021-10-28 DIAGNOSIS — I2510 Atherosclerotic heart disease of native coronary artery without angina pectoris: Secondary | ICD-10-CM | POA: Diagnosis not present

## 2021-10-28 DIAGNOSIS — N453 Epididymo-orchitis: Secondary | ICD-10-CM | POA: Diagnosis not present

## 2021-10-28 DIAGNOSIS — Z20822 Contact with and (suspected) exposure to covid-19: Secondary | ICD-10-CM | POA: Insufficient documentation

## 2021-10-28 DIAGNOSIS — R197 Diarrhea, unspecified: Secondary | ICD-10-CM | POA: Insufficient documentation

## 2021-10-28 DIAGNOSIS — R112 Nausea with vomiting, unspecified: Secondary | ICD-10-CM | POA: Insufficient documentation

## 2021-10-28 DIAGNOSIS — R509 Fever, unspecified: Secondary | ICD-10-CM | POA: Diagnosis present

## 2021-10-28 DIAGNOSIS — Z79899 Other long term (current) drug therapy: Secondary | ICD-10-CM | POA: Diagnosis not present

## 2021-10-28 LAB — COMPREHENSIVE METABOLIC PANEL
ALT: 14 U/L (ref 0–44)
AST: 24 U/L (ref 15–41)
Albumin: 3.4 g/dL — ABNORMAL LOW (ref 3.5–5.0)
Alkaline Phosphatase: 56 U/L (ref 38–126)
Anion gap: 12 (ref 5–15)
BUN: 7 mg/dL (ref 6–20)
CO2: 24 mmol/L (ref 22–32)
Calcium: 8.8 mg/dL — ABNORMAL LOW (ref 8.9–10.3)
Chloride: 100 mmol/L (ref 98–111)
Creatinine, Ser: 0.97 mg/dL (ref 0.61–1.24)
GFR, Estimated: >60 mL/min (ref >60)
Glucose, Bld: 67 mg/dL — ABNORMAL LOW (ref 70–99)
Potassium: 3.4 mmol/L — ABNORMAL LOW (ref 3.5–5.1)
Sodium: 136 mmol/L (ref 135–145)
Total Bilirubin: 0.3 mg/dL (ref 0.3–1.2)
Total Protein: 7.3 g/dL (ref 6.5–8.1)

## 2021-10-28 LAB — URINALYSIS, ROUTINE W REFLEX MICROSCOPIC
Bilirubin Urine: NEGATIVE
Glucose, UA: NEGATIVE mg/dL
Hgb urine dipstick: NEGATIVE
Ketones, ur: NEGATIVE mg/dL
Leukocytes,Ua: NEGATIVE
Nitrite: NEGATIVE
Protein, ur: 30 mg/dL — AB
Specific Gravity, Urine: 1.023 (ref 1.005–1.030)
pH: 5 (ref 5.0–8.0)

## 2021-10-28 LAB — CBC WITH DIFFERENTIAL/PLATELET
Abs Immature Granulocytes: 0.04 10*3/uL (ref 0.00–0.07)
Basophils Absolute: 0 10*3/uL (ref 0.0–0.1)
Basophils Relative: 0 %
Eosinophils Absolute: 0.1 10*3/uL (ref 0.0–0.5)
Eosinophils Relative: 1 %
HCT: 46.2 % (ref 39.0–52.0)
Hemoglobin: 15.9 g/dL (ref 13.0–17.0)
Immature Granulocytes: 1 %
Lymphocytes Relative: 19 %
Lymphs Abs: 1.6 10*3/uL (ref 0.7–4.0)
MCH: 29.4 pg (ref 26.0–34.0)
MCHC: 34.4 g/dL (ref 30.0–36.0)
MCV: 85.6 fL (ref 80.0–100.0)
Monocytes Absolute: 0.6 10*3/uL (ref 0.1–1.0)
Monocytes Relative: 7 %
Neutro Abs: 6.1 10*3/uL (ref 1.7–7.7)
Neutrophils Relative %: 72 %
Platelets: 241 10*3/uL (ref 150–400)
RBC: 5.4 MIL/uL (ref 4.22–5.81)
RDW: 13 % (ref 11.5–15.5)
WBC: 8.4 10*3/uL (ref 4.0–10.5)
nRBC: 0 % (ref 0.0–0.2)

## 2021-10-28 LAB — HIV ANTIBODY (ROUTINE TESTING W REFLEX): HIV Screen 4th Generation wRfx: NONREACTIVE

## 2021-10-28 LAB — LIPASE, BLOOD: Lipase: 23 U/L (ref 11–51)

## 2021-10-28 MED ORDER — IBUPROFEN 800 MG PO TABS
800.0000 mg | ORAL_TABLET | Freq: Once | ORAL | Status: AC
  Administered 2021-10-28: 800 mg via ORAL
  Filled 2021-10-28: qty 1

## 2021-10-28 MED ORDER — ONDANSETRON 4 MG PO TBDP
4.0000 mg | ORAL_TABLET | Freq: Once | ORAL | Status: AC
  Administered 2021-10-28: 4 mg via ORAL
  Filled 2021-10-28: qty 1

## 2021-10-28 NOTE — ED Provider Triage Note (Signed)
Emergency Medicine Provider Triage Evaluation Note ? ?[name] , a 41 y.o. male  was evaluated in triage.  Pt complains of penile discharge. ? ?Review of Systems  ?Positive: Penile discharge, abd pain, n/v/d, fever ?Negative: Hematuria, testicular swelling ? ?Physical Exam  ?BP 114/89 (BP Location: Right Arm)   Pulse (!) 108   Temp 98.2 ?F (36.8 ?C) (Oral)   Resp 18   SpO2 100%  ?Gen:   Awake, no distress   ?Resp:  Normal effort  ?MSK:   Moves extremities without difficulty  ?Other:   ? ?Medical Decision Making  ?Medically screening exam initiated at 2:38 PM.  Appropriate orders placed.  Jaber Dunlow was informed that the remainder of the evaluation will be completed by another provider, this initial triage assessment does not replace that evaluation, and the importance of remaining in the ED until their evaluation is complete. ? ?Pt report urinary discomfort with penile discharge x 2 weeks.  Report pain to R testicle.  Seen for same on 4/1.  Is sexually active with same sex.  ?  ?Domenic Moras, PA-C ?10/28/21 1445 ? ?

## 2021-10-28 NOTE — ED Provider Notes (Signed)
?[facility] ?Provider Note ? ? ?CSN: [id number] ?Arrival date & time: 10/28/21  1259 ? ?  ? ?History ? ?Chief Complaint  ?Patient presents with  ? Emesis  ? Fever  ? ? ?[name] is a 41 y.o. male. ? ?The history is provided by the patient and medical records.  ?Emesis ?Associated symptoms: diarrhea and fever   ?Fever ?Associated symptoms: diarrhea, nausea and vomiting   ? ?41 year old male with history of hypertension, coronary artery disease, peripheral vascular disease, bipolar disorder, schizophrenia, presenting to the ED with multiple complaints.  States he continues having dysuria and penile discharge that is green in color.  This has been ongoing since 10/17/21 but more intermittent than it was before.  He was treated for UTI at that visit and completed all his antibiotics.  He is sexually active with male partner.  Also reports some nausea, vomiting, and diarrhea today.  He reports subjective fever and chills.  He denies sick contacts or known covid exposures.  Also reports pain in right testicle.  States it started 3 days ago, feels "hard" to touch.   ? ?Home Medications ?Prior to Admission medications   ?Medication Sig Start Date End Date Taking? Authorizing Provider  ?amLODipine (NORVASC) 10 MG tablet Take 1 tablet (10 mg total) by mouth daily. 09/21/21  Yes Massengill, Ovid Curd, MD  ?ARIPiprazole (ABILIFY) 5 MG tablet Take 1 tablet (5 mg total) by mouth daily. 09/21/21 10/29/21 Yes Delfin Gant, NP  ?busPIRone (BUSPAR) 10 MG tablet Take 1 tablet (10 mg total) by mouth 3 (three) times daily. 09/21/21  Yes Massengill, Ovid Curd, MD  ?doxycycline (VIBRAMYCIN) 100 MG capsule Take 1 capsule (100 mg total) by mouth 2 (two) times daily. 10/29/21  Yes Larene Pickett, PA-C  ?gabapentin (NEURONTIN) 300 MG capsule Take 2 capsules (600 mg total) by mouth 3 (three) times daily. 09/20/21 10/29/21 Yes Delfin Gant, NP  ?ibuprofen (ADVIL) 200 MG tablet Take 600 mg by mouth every 6  (six) hours as needed for headache or moderate pain.   Yes [provider]  ?lamoTRIgine (LAMICTAL) 25 MG tablet Take 1 tablet (25 mg total) by mouth daily. 09/21/21 10/29/21 Yes Delfin Gant, NP  ?Multiple Vitamins-Minerals (MULTI ADULT GUMMIES PO) Take 2 tablets by mouth daily.   Yes [provider]  ?oxyCODONE-acetaminophen (PERCOCET) 5-325 MG tablet Take 1 tablet by mouth every 4 (four) hours as needed. 10/29/21  Yes Larene Pickett, PA-C  ?pantoprazole (PROTONIX) 40 MG tablet Take 1 tablet (40 mg total) by mouth daily. 09/21/21  Yes Massengill, Ovid Curd, MD  ?propranolol (INDERAL) 10 MG tablet Take 1 tablet (10 mg total) by mouth 3 (three) times daily. 09/20/21 10/29/21 Yes Delfin Gant, NP  ?rosuvastatin (CRESTOR) 10 MG tablet Take 1 tablet (10 mg total) by mouth at bedtime. 09/20/21 10/29/21 Yes Delfin Gant, NP  ?sertraline (ZOLOFT) 50 MG tablet Take 1 tablet (50 mg total) by mouth daily. 09/21/21 10/29/21 Yes Delfin Gant, NP  ?topiramate (TOPAMAX) 25 MG tablet Take 1 tablet (25 mg total) by mouth 2 (two) times daily. 09/20/21 10/29/21 Yes Delfin Gant, NP  ?trazodone (DESYREL) 300 MG tablet Take 1 tablet (300 mg total) by mouth at bedtime as needed for sleep (Insomnia). ?Patient taking differently: Take 300 mg by mouth at bedtime. 09/21/21  Yes Massengill, Ovid Curd, MD  ?cephALEXin (KEFLEX) 500 MG capsule Take 1 capsule (500 mg total) by mouth 4 (four) times daily. ?Patient not taking: Reported on 10/29/2021  10/17/21   Hayden Rasmussen, MD  ?omega-3 acid ethyl esters (LOVAZA) 1 g capsule Take 1 capsule (1 g total) by mouth daily. ?Patient not taking: Reported on 10/29/2021 09/21/21 10/21/21  Delfin Gant, NP  ?DULoxetine (CYMBALTA) 30 MG capsule Take 1 capsule (30 mg total) by mouth daily. ?Patient not taking: No sig reported 10/27/20 01/07/21  Salley Slaughter, NP  ?   ? ?Allergies    ?Codeine, Penicillins, Morphine, Coconut flavor, Coconut oil, Grapefruit concentrate,  Morphine and related, Oxycodone, and Norco [hydrocodone-acetaminophen]   ? ?Review of Systems   ?Review of Systems  ?Constitutional:  Positive for fever.  ?Gastrointestinal:  Positive for diarrhea, nausea and vomiting.  ?Genitourinary:  Positive for testicular pain.  ?All other systems reviewed and are negative. ? ?Physical Exam ?Updated Vital Signs ?BP 116/85   Pulse 80   Temp 98.2 ?F (36.8 ?C) (Oral)   Resp 18   SpO2 97%  ? ?Physical Exam ?Vitals and nursing note reviewed.  ?Constitutional:   ?   Appearance: He is well-developed.  ?HENT:  ?   Head: Normocephalic and atraumatic.  ?Eyes:  ?   Conjunctiva/sclera: Conjunctivae normal.  ?   Pupils: Pupils are equal, round, and reactive to light.  ?Cardiovascular:  ?   Rate and Rhythm: Normal rate and regular rhythm.  ?   Heart sounds: Normal heart sounds.  ?Pulmonary:  ?   Effort: Pulmonary effort is normal. No respiratory distress.  ?   Breath sounds: Normal breath sounds. No rhonchi.  ?Abdominal:  ?   General: Bowel sounds are normal.  ?   Palpations: Abdomen is soft.  ?   Tenderness: There is no abdominal tenderness. There is no rebound.  ?Genitourinary: ?   Comments: Testicles normal in appearance, no masses or overlying skin changes noted, right testicle is somewhat tender, left is non-tender, penis uncircumcised without appreciable discharge ?Musculoskeletal:     ?   General: Normal range of motion.  ?   Cervical back: Normal range of motion.  ?Skin: ?   General: Skin is warm and dry.  ?Neurological:  ?   Mental Status: He is alert and oriented to person, place, and time.  ? ? ?ED Results / Procedures / Treatments   ?Labs ?(all labs ordered are listed, but only abnormal results are displayed) ?Labs Reviewed  ?URINALYSIS, ROUTINE W REFLEX MICROSCOPIC - Abnormal; Notable for the following components:  ?    Result Value  ? APPearance HAZY (*)   ? Protein, ur 30 (*)   ? Bacteria, UA RARE (*)   ? All other components within normal limits  ?COMPREHENSIVE METABOLIC  PANEL - Abnormal; Notable for the following components:  ? Potassium 3.4 (*)   ? Glucose, Bld 67 (*)   ? Calcium 8.8 (*)   ? Albumin 3.4 (*)   ? All other components within normal limits  ?RESP PANEL BY RT-PCR (FLU A&B, COVID) ARPGX2  ?HIV ANTIBODY (ROUTINE TESTING W REFLEX)  ?CBC WITH DIFFERENTIAL/PLATELET  ?LIPASE, BLOOD  ?RPR  ?GC/CHLAMYDIA PROBE AMP ([place]) NOT AT [facility]  ? ? ?EKG ?None ? ?Radiology ?DG Chest 2 View ? ?Result Date: 10/28/2021 ?CLINICAL DATA:  Cough.  Fever. EXAM: CHEST - 2 VIEW COMPARISON:  Chest two views 09/17/2021 FINDINGS: Cardiac silhouette and mediastinal contours are within normal limits. The lungs are clear. Surgical clips again overlie the lateral right midlung on frontal view, appearing to be within the anterior and anterolateral soft tissues on lateral view. No pleural  effusion or pneumothorax. Redemonstration of plate and screw fixation of the mid and lateral clavicle with coracoclavicular ligament repair. IMPRESSION: No active cardiopulmonary disease. Electronically Signed   By: Yvonne Kendall M.D.   On: 10/28/2021 15:53  ? ?US SCROTUM W/DOPPLER ? ?Result Date: 10/28/2021 ?CLINICAL DATA:  Right-sided testicular pain x3 days. EXAM: SCROTAL ULTRASOUND DOPPLER ULTRASOUND OF THE TESTICLES TECHNIQUE: Complete ultrasound examination of the testicles, epididymis, and other scrotal structures was performed. Color and spectral Doppler ultrasound were also utilized to evaluate blood flow to the testicles. COMPARISON:  October 17, 2021 FINDINGS: Right testicle Measurements: 3.2 cm x 1.7 cm x 2.7 cm. No mass or microlithiasis visualized. Increased flow is noted throughout the right testicle on color Doppler evaluation. Left testicle Measurements: 2.5 cm x 1.3 cm x 2.1 cm. No mass or microlithiasis visualized. Right epididymis: The right epididymis is enlarged and heterogeneous in appearance, with diffusely increased flow seen on color Doppler evaluation. Left epididymis:  Normal in size and  appearance. Hydrocele:  None visualized. Varicocele:  None visualized. Pulsed Doppler interrogation of both testes demonstrates normal low resistance arterial and venous waveforms bilaterally. IMPRESSION: Findings con

## 2021-10-28 NOTE — ED Triage Notes (Addendum)
Pt reports onset yesterday of n/v/d and fever. Was seen on 4/1 at [facility] for UTI. Reports still having pain with urination, had green discharge and reports one testicle is hard.  ?

## 2021-10-29 LAB — RESP PANEL BY RT-PCR (FLU A&B, COVID) ARPGX2
Influenza A by PCR: NEGATIVE
Influenza B by PCR: NEGATIVE
SARS Coronavirus 2 by RT PCR: NEGATIVE

## 2021-10-29 LAB — RPR: RPR Ser Ql: NONREACTIVE

## 2021-10-29 MED ORDER — OXYCODONE-ACETAMINOPHEN 5-325 MG PO TABS: 1.0000 | ORAL_TABLET | ORAL | 0 refills | Status: DC | PRN

## 2021-10-29 MED ORDER — DOXYCYCLINE HYCLATE 100 MG PO CAPS: 100.0000 mg | ORAL_CAPSULE | Freq: Two times a day (BID) | ORAL | 0 refills | Status: DC

## 2021-10-29 NOTE — Discharge Instructions (Signed)
Take the prescribed medication as directed.  Make sure to finish ALL the antibiotics. ?You will be notified if your repeat gonorrhea/chlamydia testing is positive.  If so, will need to notify your partner. ?Follow-up with urology if symptoms not improving-- call for appt if needed. ?Return to the ED for new or worsening symptoms. ?

## 2021-10-29 NOTE — ED Notes (Signed)
Discharge instructions reviewed with patient. Patient verbalized understanding of instructions. Follow-up care and medications were reviewed. Patient ambulatory with steady gait. VSS upon discharge.  ?

## 2021-11-15 ENCOUNTER — Emergency Department (HOSPITAL_COMMUNITY)
Admission: EM | Admit: 2021-11-15 | Discharge: 2021-11-16 | Disposition: A | Discharge status: Other Acute Inpatient Hospital | Payer: 59 | Attending: Emergency Medicine | Admitting: Emergency Medicine

## 2021-11-15 DIAGNOSIS — Z20822 Contact with and (suspected) exposure to covid-19: Secondary | ICD-10-CM | POA: Insufficient documentation

## 2021-11-15 DIAGNOSIS — F329 Major depressive disorder, single episode, unspecified: Secondary | ICD-10-CM | POA: Insufficient documentation

## 2021-11-15 DIAGNOSIS — I10 Essential (primary) hypertension: Secondary | ICD-10-CM | POA: Insufficient documentation

## 2021-11-15 DIAGNOSIS — R45851 Suicidal ideations: Secondary | ICD-10-CM | POA: Diagnosis not present

## 2021-11-15 DIAGNOSIS — F1910 Other psychoactive substance abuse, uncomplicated: Secondary | ICD-10-CM | POA: Diagnosis not present

## 2021-11-15 DIAGNOSIS — Y908 Blood alcohol level of 240 mg/100 ml or more: Secondary | ICD-10-CM | POA: Diagnosis not present

## 2021-11-15 DIAGNOSIS — Z046 Encounter for general psychiatric examination, requested by authority: Secondary | ICD-10-CM | POA: Diagnosis present

## 2021-11-15 DIAGNOSIS — F1010 Alcohol abuse, uncomplicated: Secondary | ICD-10-CM | POA: Diagnosis not present

## 2021-11-15 LAB — RESP PANEL BY RT-PCR (FLU A&B, COVID) ARPGX2
Influenza A by PCR: NEGATIVE
Influenza B by PCR: NEGATIVE
SARS Coronavirus 2 by RT PCR: NEGATIVE

## 2021-11-15 LAB — BLOOD GAS, VENOUS
Acid-Base Excess: 3 mmol/L — ABNORMAL HIGH (ref 0.0–2.0)
Bicarbonate: 28.5 mmol/L — ABNORMAL HIGH (ref 20.0–28.0)
O2 Saturation: 83.3 %
Patient temperature: 37
pCO2, Ven: 46 mmHg (ref 44–60)
pH, Ven: 7.4 (ref 7.25–7.43)
pO2, Ven: 51 mmHg — ABNORMAL HIGH (ref 32–45)

## 2021-11-15 LAB — COMPREHENSIVE METABOLIC PANEL
ALT: 42 U/L (ref 0–44)
AST: 88 U/L — ABNORMAL HIGH (ref 15–41)
Albumin: 4 g/dL (ref 3.5–5.0)
Alkaline Phosphatase: 58 U/L (ref 38–126)
Anion gap: 12 (ref 5–15)
BUN: 10 mg/dL (ref 6–20)
CO2: 25 mmol/L (ref 22–32)
Calcium: 8.3 mg/dL — ABNORMAL LOW (ref 8.9–10.3)
Chloride: 102 mmol/L (ref 98–111)
Creatinine, Ser: 0.72 mg/dL (ref 0.61–1.24)
GFR, Estimated: >60 mL/min (ref >60)
Glucose, Bld: 77 mg/dL (ref 70–99)
Potassium: 4.1 mmol/L (ref 3.5–5.1)
Sodium: 139 mmol/L (ref 135–145)
Total Bilirubin: 0.6 mg/dL (ref 0.3–1.2)
Total Protein: 7.7 g/dL (ref 6.5–8.1)

## 2021-11-15 LAB — CBC WITH DIFFERENTIAL/PLATELET
Abs Immature Granulocytes: 0 10*3/uL (ref 0.00–0.07)
Basophils Absolute: 0 10*3/uL (ref 0.0–0.1)
Basophils Relative: 1 %
Eosinophils Absolute: 0 10*3/uL (ref 0.0–0.5)
Eosinophils Relative: 0 %
HCT: 46 % (ref 39.0–52.0)
Hemoglobin: 16.1 g/dL (ref 13.0–17.0)
Immature Granulocytes: 0 %
Lymphocytes Relative: 41 %
Lymphs Abs: 1.4 10*3/uL (ref 0.7–4.0)
MCH: 29.9 pg (ref 26.0–34.0)
MCHC: 35 g/dL (ref 30.0–36.0)
MCV: 85.5 fL (ref 80.0–100.0)
Monocytes Absolute: 0.4 10*3/uL (ref 0.1–1.0)
Monocytes Relative: 11 %
Neutro Abs: 1.6 10*3/uL — ABNORMAL LOW (ref 1.7–7.7)
Neutrophils Relative %: 47 %
Platelets: 147 10*3/uL — ABNORMAL LOW (ref 150–400)
RBC: 5.38 MIL/uL (ref 4.22–5.81)
RDW: 13.7 % (ref 11.5–15.5)
WBC: 3.4 10*3/uL — ABNORMAL LOW (ref 4.0–10.5)
nRBC: 0 % (ref 0.0–0.2)

## 2021-11-15 LAB — RAPID URINE DRUG SCREEN, HOSP PERFORMED
Amphetamines: NOT DETECTED
Barbiturates: NOT DETECTED
Benzodiazepines: NOT DETECTED
Cocaine: POSITIVE — AB
Opiates: NOT DETECTED
Tetrahydrocannabinol: NOT DETECTED

## 2021-11-15 LAB — ETHANOL: Alcohol, Ethyl (B): 374 mg/dL (ref <10)

## 2021-11-15 LAB — SALICYLATE LEVEL: Salicylate Lvl: <7 mg/dL — ABNORMAL LOW (ref 7.0–30.0)

## 2021-11-15 LAB — OSMOLALITY: Osmolality: 383 mOsm/kg (ref 275–295)

## 2021-11-15 LAB — ACETAMINOPHEN LEVEL: Acetaminophen (Tylenol), Serum: <10 ug/mL — ABNORMAL LOW (ref 10–30)

## 2021-11-15 MED ORDER — LORAZEPAM 2 MG/ML IJ SOLN: 0.0000 mg | Freq: Two times a day (BID) | INTRAMUSCULAR | Status: DC

## 2021-11-15 MED ORDER — ONDANSETRON 4 MG PO TBDP
4.0000 mg | ORAL_TABLET | Freq: Once | ORAL | Status: AC
  Administered 2021-11-15: 4 mg via ORAL
  Filled 2021-11-15: qty 1

## 2021-11-15 MED ORDER — PHENYTOIN SODIUM EXTENDED 30 MG PO CAPS
30.0000 mg | ORAL_CAPSULE | Freq: Every day | ORAL | Status: DC
Start: 2021-11-15 — End: 2021-11-15
  Administered 2021-11-15: 30 mg via ORAL
  Filled 2021-11-15: qty 1

## 2021-11-15 MED ORDER — BENZTROPINE MESYLATE 0.5 MG PO TABS
1.0000 mg | ORAL_TABLET | Freq: Four times a day (QID) | ORAL | Status: DC | PRN
  Administered 2021-11-16: 1 mg via ORAL
  Filled 2021-11-15: qty 2

## 2021-11-15 MED ORDER — HYDROXYZINE HCL 25 MG PO TABS
25.0000 mg | ORAL_TABLET | Freq: Three times a day (TID) | ORAL | Status: DC
  Administered 2021-11-15 – 2021-11-16 (×3): 25 mg via ORAL
  Filled 2021-11-15 (×3): qty 1

## 2021-11-15 MED ORDER — LORAZEPAM 1 MG PO TABS
2.0000 mg | ORAL_TABLET | Freq: Once | ORAL | Status: AC
  Administered 2021-11-15: 2 mg via ORAL
  Filled 2021-11-15: qty 2

## 2021-11-15 MED ORDER — AMLODIPINE BESYLATE 5 MG PO TABS
10.0000 mg | ORAL_TABLET | Freq: Every day | ORAL | Status: DC
  Administered 2021-11-15 – 2021-11-16 (×2): 10 mg via ORAL
  Filled 2021-11-15 (×2): qty 2

## 2021-11-15 MED ORDER — LORAZEPAM 1 MG PO TABS
0.0000 mg | ORAL_TABLET | Freq: Four times a day (QID) | ORAL | Status: DC
  Administered 2021-11-15 – 2021-11-16 (×4): 1 mg via ORAL
  Filled 2021-11-15 (×4): qty 1

## 2021-11-15 MED ORDER — LORAZEPAM 2 MG/ML IJ SOLN: 0.0000 mg | Freq: Four times a day (QID) | INTRAMUSCULAR | Status: DC

## 2021-11-15 MED ORDER — HALOPERIDOL 5 MG PO TABS
5.0000 mg | ORAL_TABLET | Freq: Four times a day (QID) | ORAL | Status: DC | PRN
  Administered 2021-11-16: 5 mg via ORAL
  Filled 2021-11-15: qty 1

## 2021-11-15 MED ORDER — THIAMINE HCL 100 MG/ML IJ SOLN: 100.0000 mg | Freq: Every day | INTRAMUSCULAR | Status: DC

## 2021-11-15 MED ORDER — PANTOPRAZOLE SODIUM 40 MG PO TBEC
40.0000 mg | DELAYED_RELEASE_TABLET | Freq: Every day | ORAL | Status: DC
  Administered 2021-11-15 – 2021-11-16 (×2): 40 mg via ORAL
  Filled 2021-11-15 (×2): qty 1

## 2021-11-15 MED ORDER — ZIPRASIDONE MESYLATE 20 MG IM SOLR: 20.0000 mg | Freq: Once | INTRAMUSCULAR | Status: DC | PRN

## 2021-11-15 MED ORDER — THIAMINE HCL 100 MG PO TABS
100.0000 mg | ORAL_TABLET | Freq: Every day | ORAL | Status: DC
  Administered 2021-11-16: 100 mg via ORAL
  Filled 2021-11-15: qty 1

## 2021-11-15 MED ORDER — LORAZEPAM 1 MG PO TABS: 0.0000 mg | ORAL_TABLET | Freq: Two times a day (BID) | ORAL | Status: DC

## 2021-11-15 MED ORDER — TRAZODONE HCL 100 MG PO TABS
300.0000 mg | ORAL_TABLET | Freq: Every evening | ORAL | Status: DC | PRN
  Administered 2021-11-15: 300 mg via ORAL
  Filled 2021-11-15: qty 3

## 2021-11-15 NOTE — ED Triage Notes (Signed)
Pt arrives via [facility] for SI. 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 Pt appears very paranoid on arrival, states that she feels someone is out to get her. Pt endorses AVH, but unable to elaborate. Endorses drinking alcohol earlier today. EMS reports that pt grabbed a bottle of Purell and drank a large amount before they were able to get it away. Pt has had episode of emesis following this, and constant nausea. No meds administered by EMS.  ? ?EMS last VS - 120/74, HR 96 (sinus rhythm with multifocal PVS), RR 16, 98% on RA, CBG 110.  ?

## 2021-11-15 NOTE — ED Notes (Signed)
Pt is VOLUNTARY at this time.  ?

## 2021-11-15 NOTE — ED Provider Notes (Signed)
?[facility] [place] DEPT ?Provider Note ? ? ?CSN: [id number] ?Arrival date & time: 11/15/21  1250 ? ?  ? ?History ? ?Chief Complaint  ?Patient presents with  ? Suicidal  ? ? ?[name] is a 41 y.o. adult. ? ?HPI ? ?  ? ?41 year old patient comes in with chief complaint of suicidal ideation. ?Patient is undergoing male to male transition.  He prefers going by first name of [name]. ? ?Patient states that he called EMS while at home because he has been drinking Purell hand sanitizer for the last 3 weeks.  He started having suicidal ideations and called EMS. ? ?EMS indicates that patient appeared paranoid, and she kept on complaining that someone is trying to get her. ? ?Patient indicates that she also has been having some nausea, vomiting and dark stools.  She had mostly yellow vomit, but also thinks that there could have been some blood in it. ? ?[name] past medical history includes liver cirrhosis, pancreatitis, CAD, schizophrenia ? ?Home Medications ?Prior to Admission medications   ?Medication Sig Start Date End Date Taking? Authorizing Provider  ?amLODipine (NORVASC) 10 MG tablet Take 1 tablet (10 mg total) by mouth daily. 09/21/21  Yes Massengill, Ovid Curd, MD  ?ARIPiprazole (ABILIFY) 5 MG tablet Take 1 tablet (5 mg total) by mouth daily. 09/21/21 11/16/22 Yes Delfin Gant, NP  ?busPIRone (BUSPAR) 10 MG tablet Take 1 tablet (10 mg total) by mouth 3 (three) times daily. 09/21/21  Yes Massengill, Ovid Curd, MD  ?gabapentin (NEURONTIN) 300 MG capsule Take 2 capsules (600 mg total) by mouth 3 (three) times daily. 09/20/21 11/16/22 Yes Delfin Gant, NP  ?ibuprofen (ADVIL) 200 MG tablet Take 600 mg by mouth every 6 (six) hours as needed for headache or moderate pain.   Yes [provider]  ?lamoTRIgine (LAMICTAL) 25 MG tablet Take 1 tablet (25 mg total) by mouth daily. 09/21/21 11/16/22 Yes Delfin Gant, NP  ?Multiple Vitamins-Minerals (MULTI ADULT GUMMIES PO) Take 2 tablets by  mouth daily.   Yes [provider]  ?pantoprazole (PROTONIX) 40 MG tablet Take 1 tablet (40 mg total) by mouth daily. 09/21/21  Yes Massengill, Ovid Curd, MD  ?phenytoin (DILANTIN) 30 MG ER capsule Take 30 mg by mouth daily.   Yes [provider]  ?propranolol (INDERAL) 10 MG tablet Take 1 tablet (10 mg total) by mouth 3 (three) times daily. 09/20/21 11/16/22 Yes Delfin Gant, NP  ?rosuvastatin (CRESTOR) 10 MG tablet Take 1 tablet (10 mg total) by mouth at bedtime. 09/20/21 11/16/22 Yes Delfin Gant, NP  ?sertraline (ZOLOFT) 50 MG tablet Take 1 tablet (50 mg total) by mouth daily. 09/21/21 11/16/22 Yes Delfin Gant, NP  ?topiramate (TOPAMAX) 25 MG tablet Take 1 tablet (25 mg total) by mouth 2 (two) times daily. 09/20/21 11/16/22 Yes Delfin Gant, NP  ?trazodone (DESYREL) 300 MG tablet Take 1 tablet (300 mg total) by mouth at bedtime as needed for sleep (Insomnia). ?Patient taking differently: Take 300 mg by mouth at bedtime. 09/21/21  Yes Massengill, Ovid Curd, MD  ?DULoxetine (CYMBALTA) 60 MG capsule Take 60 mg by mouth daily. ?Patient not taking: Reported on 11/15/2021    [provider]  ?hydrOXYzine (ATARAX) 25 MG tablet Take 25 mg by mouth 3 (three) times daily. ?Patient not taking: Reported on 11/15/2021 10/29/19   [provider]  ?omega-3 acid ethyl esters (LOVAZA) 1 g capsule Take 1 capsule (1 g total) by mouth daily. ?Patient not taking: Reported on 10/29/2021 09/21/21 10/21/21  Delfin Gant, NP  ?   ? ?Allergies    ?Codeine, Penicillins, Morphine, Coconut flavor, Coconut oil, Grapefruit concentrate, Morphine and related, Oxycodone, and Norco [hydrocodone-acetaminophen]   ? ?Review of Systems   ?Review of Systems  ?All other systems reviewed and are negative. ? ?Physical Exam ?Updated Vital Signs ?BP (!) 116/95 (BP Location: Left Arm)   Pulse (!) 111   Temp 98.7 ?F (37.1 ?C) (Oral)   Resp 15   SpO2 92%  ?Physical Exam ?Vitals and nursing note reviewed.   ?Constitutional:   ?   Appearance: He is well-developed.  ?HENT:  ?   Head: Atraumatic.  ?Eyes:  ?   Extraocular Movements: Extraocular movements intact.  ?Cardiovascular:  ?   Rate and Rhythm: Normal rate.  ?   Heart sounds: Normal heart sounds.  ?Pulmonary:  ?   Effort: Pulmonary effort is normal. No respiratory distress.  ?Abdominal:  ?   General: There is no distension.  ?   Palpations: Abdomen is soft.  ?   Tenderness: There is abdominal tenderness. There is no guarding or rebound.  ?Musculoskeletal:  ?   Cervical back: Neck supple.  ?Skin: ?   General: Skin is warm and dry.  ?Neurological:  ?   Mental Status: He is alert and oriented to person, place, and time.  ?   Comments: Confused  ? ? ?ED Results / Procedures / Treatments   ?Labs ?(all labs ordered are listed, but only abnormal results are displayed) ?Labs Reviewed  ?COMPREHENSIVE METABOLIC PANEL - Abnormal; Notable for the following components:  ?    Result Value  ? Calcium 8.3 (*)   ? AST 88 (*)   ? All other components within normal limits  ?ETHANOL - Abnormal; Notable for the following components:  ? Alcohol, Ethyl (B) 374 (*)   ? All other components within normal limits  ?RAPID URINE DRUG SCREEN, HOSP PERFORMED - Abnormal; Notable for the following components:  ? Cocaine POSITIVE (*)   ? All other components within normal limits  ?CBC WITH DIFFERENTIAL/PLATELET - Abnormal; Notable for the following components:  ? WBC 3.4 (*)   ? Platelets 147 (*)   ? Neutro Abs 1.6 (*)   ? All other components within normal limits  ?SALICYLATE LEVEL - Abnormal; Notable for the following components:  ? Salicylate Lvl <1.6 (*)   ? All other components within normal limits  ?ACETAMINOPHEN LEVEL - Abnormal; Notable for the following components:  ? Acetaminophen (Tylenol), Serum <10 (*)   ? All other components within normal limits  ?BLOOD GAS, VENOUS - Abnormal; Notable for the following components:  ? pO2, Ven 51 (*)   ? Bicarbonate 28.5 (*)   ? Acid-Base Excess 3.0  (*)   ? All other components within normal limits  ?OSMOLALITY - Abnormal; Notable for the following components:  ? Osmolality 383 (*)   ? All other components within normal limits  ?RESP PANEL BY RT-PCR (FLU A&B, COVID) ARPGX2  ? ? ?EKG ?EKG Interpretation ? ?Date/Time:  Sunday November 15 2021 13:31:35 EDT ?Ventricular Rate:  91 ?PR Interval:  126 ?QRS Duration: 78 ?QT Interval:  382 ?QTC Calculation: 469 ?R Axis:   72 ?Text Interpretation: Normal ECG When compared with ECG of 17-Sep-2021 13:36, PREVIOUS ECG IS PRESENT Leads missing, however intervals are normal Confirmed by Varney Biles [phone]) on 11/15/2021 2:12:03 PM ? ?Radiology ?No results found. ? ?Procedures ?Marland KitchenCritical Care ?Performed by: Varney Biles, MD ?Authorized by: Varney Biles, MD  ? ?  Critical care provider statement:  ?  Critical care time (minutes):  30 ?  Critical care was necessary to treat or prevent imminent or life-threatening deterioration of the following conditions:  [place] ?  Critical care was time spent personally by me on the following activities:  Development of treatment plan with patient or surrogate, discussions with consultants, evaluation of patient's response to treatment, examination of patient, ordering and review of laboratory studies, ordering and review of radiographic studies, ordering and performing treatments and interventions, pulse oximetry, re-evaluation of patient's condition and review of old charts  ? ? ?Medications Ordered in ED ?Medications  ?ziprasidone (GEODON) injection 20 mg (has no administration in time range)  ?amLODipine (NORVASC) tablet 10 mg (has no administration in time range)  ?hydrOXYzine (ATARAX) tablet 25 mg (has no administration in time range)  ?phenytoin (DILANTIN) ER capsule 30 mg (has no administration in time range)  ?pantoprazole (PROTONIX) EC tablet 40 mg (has no administration in time range)  ?traZODone (DESYREL) tablet 300 mg (has no administration in time range)  ?haloperidol  (HALDOL) tablet 5 mg (has no administration in time range)  ?  And  ?benztropine (COGENTIN) tablet 1 mg (has no administration in time range)  ?LORazepam (ATIVAN) injection 0-4 mg (has no administration in time range)

## 2021-11-15 NOTE — ED Notes (Signed)
Date and time results received: 11/15/21 2:39 PM ?(use smartphrase ".now" to insert current time) ? ?Test: ethanol   ?Critical Value: 374 ? ?Name of Provider Notified: Dr. Kathrynn Humble  ? ?Orders Received? Or Actions Taken?:  No new orders ?

## 2021-11-16 ENCOUNTER — Inpatient Hospital Stay (HOSPITAL_COMMUNITY)
Admission: AD | Admit: 2021-11-16 | Discharge: 2021-11-19 | DRG: 885 | Disposition: A | Discharge status: Home / Self Care | Payer: 59 | Source: Intra-hospital | Attending: Emergency Medicine | Admitting: Emergency Medicine

## 2021-11-16 ENCOUNTER — Encounter (HOSPITAL_COMMUNITY): Payer: Self-pay | Admitting: Nurse Practitioner

## 2021-11-16 DIAGNOSIS — F411 Generalized anxiety disorder: Secondary | ICD-10-CM | POA: Diagnosis present

## 2021-11-16 DIAGNOSIS — Z8616 Personal history of COVID-19: Secondary | ICD-10-CM | POA: Diagnosis not present

## 2021-11-16 DIAGNOSIS — E785 Hyperlipidemia, unspecified: Secondary | ICD-10-CM | POA: Diagnosis present

## 2021-11-16 DIAGNOSIS — T5192XA Toxic effect of unspecified alcohol, intentional self-harm, initial encounter: Secondary | ICD-10-CM | POA: Diagnosis present

## 2021-11-16 DIAGNOSIS — K219 Gastro-esophageal reflux disease without esophagitis: Secondary | ICD-10-CM | POA: Diagnosis present

## 2021-11-16 DIAGNOSIS — I10 Essential (primary) hypertension: Secondary | ICD-10-CM | POA: Diagnosis present

## 2021-11-16 DIAGNOSIS — Z8249 Family history of ischemic heart disease and other diseases of the circulatory system: Secondary | ICD-10-CM

## 2021-11-16 DIAGNOSIS — F1090 Alcohol use, unspecified, uncomplicated: Secondary | ICD-10-CM | POA: Diagnosis present

## 2021-11-16 DIAGNOSIS — F1010 Alcohol abuse, uncomplicated: Secondary | ICD-10-CM | POA: Diagnosis not present

## 2021-11-16 DIAGNOSIS — Z9151 Personal history of suicidal behavior: Secondary | ICD-10-CM

## 2021-11-16 DIAGNOSIS — F332 Major depressive disorder, recurrent severe without psychotic features: Principal | ICD-10-CM | POA: Diagnosis present

## 2021-11-16 DIAGNOSIS — F17210 Nicotine dependence, cigarettes, uncomplicated: Secondary | ICD-10-CM | POA: Diagnosis present

## 2021-11-16 DIAGNOSIS — Z79899 Other long term (current) drug therapy: Secondary | ICD-10-CM | POA: Diagnosis not present

## 2021-11-16 DIAGNOSIS — F10239 Alcohol dependence with withdrawal, unspecified: Secondary | ICD-10-CM | POA: Diagnosis present

## 2021-11-16 DIAGNOSIS — Z9013 Acquired absence of bilateral breasts and nipples: Secondary | ICD-10-CM | POA: Diagnosis not present

## 2021-11-16 DIAGNOSIS — G40909 Epilepsy, unspecified, not intractable, without status epilepticus: Secondary | ICD-10-CM | POA: Diagnosis present

## 2021-11-16 DIAGNOSIS — Y908 Blood alcohol level of 240 mg/100 ml or more: Secondary | ICD-10-CM | POA: Diagnosis present

## 2021-11-16 DIAGNOSIS — F410 Panic disorder [episodic paroxysmal anxiety] without agoraphobia: Secondary | ICD-10-CM | POA: Diagnosis present

## 2021-11-16 DIAGNOSIS — Z85528 Personal history of other malignant neoplasm of kidney: Secondary | ICD-10-CM | POA: Diagnosis not present

## 2021-11-16 DIAGNOSIS — F333 Major depressive disorder, recurrent, severe with psychotic symptoms: Secondary | ICD-10-CM | POA: Diagnosis present

## 2021-11-16 DIAGNOSIS — Z905 Acquired absence of kidney: Secondary | ICD-10-CM

## 2021-11-16 DIAGNOSIS — Z853 Personal history of malignant neoplasm of breast: Secondary | ICD-10-CM | POA: Diagnosis not present

## 2021-11-16 DIAGNOSIS — T1491XA Suicide attempt, initial encounter: Secondary | ICD-10-CM | POA: Diagnosis present

## 2021-11-16 DIAGNOSIS — F329 Major depressive disorder, single episode, unspecified: Secondary | ICD-10-CM | POA: Diagnosis not present

## 2021-11-16 MED ORDER — LAMOTRIGINE 25 MG PO TABS
25.0000 mg | ORAL_TABLET | Freq: Every day | ORAL | Status: DC
  Administered 2021-11-16: 25 mg via ORAL
  Filled 2021-11-16: qty 1

## 2021-11-16 MED ORDER — LORAZEPAM 1 MG PO TABS
0.0000 mg | ORAL_TABLET | Freq: Two times a day (BID) | ORAL | Status: DC
  Administered 2021-11-18: 1 mg via ORAL
  Filled 2021-11-16: qty 1

## 2021-11-16 MED ORDER — BENZTROPINE MESYLATE 1 MG PO TABS
1.0000 mg | ORAL_TABLET | Freq: Four times a day (QID) | ORAL | Status: DC | PRN
Start: 2021-11-16 — End: 2021-11-19

## 2021-11-16 MED ORDER — LORAZEPAM 2 MG/ML IJ SOLN: 0.0000 mg | Freq: Two times a day (BID) | INTRAMUSCULAR | Status: DC

## 2021-11-16 MED ORDER — PROPRANOLOL HCL 10 MG PO TABS
10.0000 mg | ORAL_TABLET | Freq: Three times a day (TID) | ORAL | Status: DC
  Administered 2021-11-17 (×3): 10 mg via ORAL
  Filled 2021-11-16 (×7): qty 1

## 2021-11-16 MED ORDER — PROPRANOLOL HCL 20 MG PO TABS
10.0000 mg | ORAL_TABLET | Freq: Three times a day (TID) | ORAL | Status: DC
  Administered 2021-11-16 (×2): 10 mg via ORAL
  Filled 2021-11-16 (×2): qty 1

## 2021-11-16 MED ORDER — SERTRALINE HCL 50 MG PO TABS
50.0000 mg | ORAL_TABLET | Freq: Every day | ORAL | Status: DC
  Administered 2021-11-16: 50 mg via ORAL
  Filled 2021-11-16: qty 1

## 2021-11-16 MED ORDER — THIAMINE HCL 100 MG/ML IJ SOLN: 100.0000 mg | Freq: Every day | INTRAMUSCULAR | Status: DC

## 2021-11-16 MED ORDER — ALUM & MAG HYDROXIDE-SIMETH 200-200-20 MG/5ML PO SUSP
30.0000 mL | ORAL | Status: DC | PRN
Start: 2021-11-16 — End: 2021-11-19

## 2021-11-16 MED ORDER — GABAPENTIN 300 MG PO CAPS
600.0000 mg | ORAL_CAPSULE | Freq: Three times a day (TID) | ORAL | Status: DC
  Administered 2021-11-16 (×2): 600 mg via ORAL
  Filled 2021-11-16 (×2): qty 2

## 2021-11-16 MED ORDER — TRAZODONE HCL 150 MG PO TABS
300.0000 mg | ORAL_TABLET | Freq: Every evening | ORAL | Status: DC | PRN
  Administered 2021-11-16 – 2021-11-18 (×3): 300 mg via ORAL
  Filled 2021-11-16 (×3): qty 2

## 2021-11-16 MED ORDER — MAGNESIUM HYDROXIDE 400 MG/5ML PO SUSP: 30.0000 mL | Freq: Every day | ORAL | Status: DC | PRN

## 2021-11-16 MED ORDER — HYDROXYZINE HCL 25 MG PO TABS
25.0000 mg | ORAL_TABLET | Freq: Three times a day (TID) | ORAL | Status: DC
  Administered 2021-11-17 – 2021-11-19 (×7): 25 mg via ORAL
  Filled 2021-11-16 (×10): qty 1

## 2021-11-16 MED ORDER — GABAPENTIN 300 MG PO CAPS
600.0000 mg | ORAL_CAPSULE | Freq: Three times a day (TID) | ORAL | Status: DC
  Administered 2021-11-17 – 2021-11-19 (×7): 600 mg via ORAL
  Filled 2021-11-16 (×10): qty 2

## 2021-11-16 MED ORDER — BUSPIRONE HCL 10 MG PO TABS
10.0000 mg | ORAL_TABLET | Freq: Three times a day (TID) | ORAL | Status: DC
  Administered 2021-11-17 – 2021-11-19 (×7): 10 mg via ORAL
  Filled 2021-11-16 (×10): qty 1

## 2021-11-16 MED ORDER — PANTOPRAZOLE SODIUM 40 MG PO TBEC
40.0000 mg | DELAYED_RELEASE_TABLET | Freq: Every day | ORAL | Status: DC
  Administered 2021-11-17 – 2021-11-19 (×3): 40 mg via ORAL
  Filled 2021-11-16 (×4): qty 1

## 2021-11-16 MED ORDER — LORAZEPAM 2 MG/ML IJ SOLN: 0.0000 mg | Freq: Four times a day (QID) | INTRAMUSCULAR | Status: AC

## 2021-11-16 MED ORDER — LORAZEPAM 1 MG PO TABS
0.0000 mg | ORAL_TABLET | Freq: Four times a day (QID) | ORAL | Status: AC
  Administered 2021-11-17 (×2): 2 mg via ORAL
  Filled 2021-11-16 (×2): qty 2

## 2021-11-16 MED ORDER — ROSUVASTATIN CALCIUM 20 MG PO TABS: 10.0000 mg | ORAL_TABLET | Freq: Every day | ORAL | Status: DC

## 2021-11-16 MED ORDER — ARIPIPRAZOLE 5 MG PO TABS
5.0000 mg | ORAL_TABLET | Freq: Every day | ORAL | Status: DC
  Administered 2021-11-17 – 2021-11-18 (×2): 5 mg via ORAL
  Filled 2021-11-16 (×4): qty 1

## 2021-11-16 MED ORDER — ROSUVASTATIN CALCIUM 10 MG PO TABS
10.0000 mg | ORAL_TABLET | Freq: Every day | ORAL | Status: DC
  Administered 2021-11-17 – 2021-11-18 (×2): 10 mg via ORAL
  Filled 2021-11-16 (×3): qty 1

## 2021-11-16 MED ORDER — ENSURE ENLIVE PO LIQD
237.0000 mL | Freq: Two times a day (BID) | ORAL | Status: DC
  Administered 2021-11-17 – 2021-11-19 (×5): 237 mL via ORAL
  Filled 2021-11-16 (×7): qty 237

## 2021-11-16 MED ORDER — LORAZEPAM 2 MG/ML IJ SOLN
2.0000 mg | Freq: Once | INTRAMUSCULAR | Status: AC
  Administered 2021-11-16: 2 mg via INTRAMUSCULAR
  Filled 2021-11-16: qty 1

## 2021-11-16 MED ORDER — LAMOTRIGINE 25 MG PO TABS
25.0000 mg | ORAL_TABLET | Freq: Every day | ORAL | Status: DC
  Administered 2021-11-17 – 2021-11-19 (×3): 25 mg via ORAL
  Filled 2021-11-16 (×4): qty 1

## 2021-11-16 MED ORDER — ARIPIPRAZOLE 5 MG PO TABS
5.0000 mg | ORAL_TABLET | Freq: Every day | ORAL | Status: DC
  Administered 2021-11-16: 5 mg via ORAL
  Filled 2021-11-16: qty 1

## 2021-11-16 MED ORDER — HALOPERIDOL 5 MG PO TABS: 5.0000 mg | ORAL_TABLET | Freq: Four times a day (QID) | ORAL | Status: DC | PRN

## 2021-11-16 MED ORDER — ACETAMINOPHEN 500 MG PO TABS
1000.0000 mg | ORAL_TABLET | Freq: Once | ORAL | Status: AC
  Administered 2021-11-16: 1000 mg via ORAL
  Filled 2021-11-16: qty 2

## 2021-11-16 MED ORDER — ROSUVASTATIN CALCIUM 10 MG PO TABS
10.0000 mg | ORAL_TABLET | Freq: Every day | ORAL | Status: DC
  Filled 2021-11-16: qty 1

## 2021-11-16 MED ORDER — ACETAMINOPHEN 325 MG PO TABS
650.0000 mg | ORAL_TABLET | Freq: Four times a day (QID) | ORAL | Status: DC | PRN
  Administered 2021-11-17 – 2021-11-19 (×3): 650 mg via ORAL
  Filled 2021-11-16 (×3): qty 2

## 2021-11-16 MED ORDER — SERTRALINE HCL 50 MG PO TABS
50.0000 mg | ORAL_TABLET | Freq: Every day | ORAL | Status: DC
  Administered 2021-11-17 – 2021-11-19 (×3): 50 mg via ORAL
  Filled 2021-11-16 (×6): qty 1

## 2021-11-16 MED ORDER — AMLODIPINE BESYLATE 10 MG PO TABS
10.0000 mg | ORAL_TABLET | Freq: Every day | ORAL | Status: DC
  Administered 2021-11-17: 10 mg via ORAL
  Filled 2021-11-16 (×3): qty 1

## 2021-11-16 MED ORDER — THIAMINE HCL 100 MG PO TABS
100.0000 mg | ORAL_TABLET | Freq: Every day | ORAL | Status: DC
  Administered 2021-11-17 – 2021-11-19 (×3): 100 mg via ORAL
  Filled 2021-11-16 (×4): qty 1

## 2021-11-16 MED ORDER — BUSPIRONE HCL 10 MG PO TABS
10.0000 mg | ORAL_TABLET | Freq: Three times a day (TID) | ORAL | Status: DC
  Administered 2021-11-16 (×2): 10 mg via ORAL
  Filled 2021-11-16 (×2): qty 1

## 2021-11-16 NOTE — ED Notes (Signed)
???    Patient meets inpatient criteria. 

## 2021-11-16 NOTE — ED Notes (Signed)
Patient enroute to [facility] via [facility].  ?

## 2021-11-16 NOTE — ED Notes (Signed)
Report given and [employer] called for transportation to [facility] ?

## 2021-11-16 NOTE — Consult Note (Signed)
Patient was seen this morning by this provider.  She is tearful and endorsed suicide but stated using Purell for three weeks for to get cheap high.  She also stated she feels suicidal because she feels lonely.  She lives with her parents but feels lonely.  She continues to meet criteria for inpatient Psychiatric hospitalization.  We will seek inpatient admission for safety and stabilization.  She has been cleared by DR Idolina Primer. ? ?

## 2021-11-16 NOTE — Progress Notes (Signed)
Pt was accepted to [facility] 11/16/2021; Bed Assignment 404-1 ? ?Pt meets inpatient criteria per Charmaine Downs, NP ? ?Attending Physician will be Nelda Marseille MD ? ?Report can be called to: Adult unit: [phone]; voluntary consent signed and faxed to [phone]. ? ?Pt can arrive after to be determined per night shift ? ?Care Team notified: [facility] Lynnda Shields, RN, Charmaine Downs, NP, Midge Minium, RN, and Lynford Humphrey, RN. ? ?Nadara Mode, LCSWA ?11/16/2021 @ 6:42 PM ? ?

## 2021-11-16 NOTE — ED Notes (Signed)
Patient states he is feeling agitated and seeing hallucinations. States he is seeing bugs crawl on the pillows and a TV in the hallway.  ?

## 2021-11-16 NOTE — ED Notes (Signed)
[employer] is here for transport ?

## 2021-11-16 NOTE — ED Provider Notes (Signed)
?  Physical Exam  ?BP 110/68   Pulse (!) 127   Temp 99.4 ?F (37.4 ?C) (Oral)   Resp 16   SpO2 94%  ? ?Physical Exam ? ?Procedures  ?Procedures ? ?ED Course / MDM  ? ?Clinical Course as of 11/16/21 0804  ?Sun Nov 15, 2021  ?1501 Blood work reviewed.  Acetaminophen, salicylate levels are negative.  pH is 7.4.  ?Serum Osm appear to be normal.  [AN]  ?1502 Ethanol(!!) ?Ethanol level is over 370.  ?We will need to reassess her. [AN]  ?1527 Patient getting agitated. ?Wants to leave.  Tried to calm patient down with de-escalation technique verbally. ?We will proceed with IVC.  As needed Geodon ordered. [AN]  ?1527 Patient continues to ask to leave.  Reassessed again at 5:25 p.m.  Patient informed that his alcohol level is too high, that it is not safe to discharge him right now.  Patient states that he would like to go home, as his SI was triggered by him being intoxicated.  He does not have any active plan of hurting himself.  He starts having SI when he is drinking heavily.  He would prefer going to behavioral health urgent care crisis center if needed. ? ?Nursing staff is informed that patient is already ambulated without any issues to the bathroom.  ? ?Unfortunately, patient does not have anyone that can pick him up.  He is agreed to waiting patiently until 7 PM for [place] to reassess the situation.  If he were to stay longer, he wants some Ativan and Zofran.  We will start oral challenge. ? ?Patient appears clinically sober right now despite having high ethanol level on labs.  He looks better than he did before.  He is more appropriate with his responses as well. ? ?If he continues to behave well, we will certainly entertain discharge is a possibility. [AN]  ?1752 Osmolality(!!): 383 ?Serum osm elevated as expected. ?Calculated osmole gap, it is noted to be -4.  ?Literature indicates that gap of less than 10 is acceptable. ? ?Also reviewed literature on isopropyl alcohol ingestion.  This alcohol is absorbed very  quickly into the system, and in acute ingestion patient needs to be monitored for about 2 hours.  This patient has been recreationally using IA for a while, but the general concept of kinetics still apply.  He is not hypotensive.  His nausea, vomiting likely were because of his toxic ingestion.  We will continue with the supportive treatment as recommended by the literature. [AN]  ?2237 Patient has overall remained cooperative after initial hesitation. ? ?She is medically cleared for psychiatric evaluation. ? ?CIWA protocol ordered. [AN]  ?  ?Clinical Course User Index ?[AN] Varney Biles, MD  ? ?Medical Decision Making ?Amount and/or Complexity of Data Reviewed ?Labs: ordered. Decision-making details documented in ED Course. ? ?Risk ?OTC drugs. ?Prescription drug management. ? ? ?40yo patient [name] here for SI.  Awaiting placement.  ? ?Began having sensation of things crawling, has tachycardia, tremor, nausea, headache. ? ?Given additional ativan for etoh withdrawal and tylenol for headache.  Ordered home medications (which include propranolol and consider this also possible etiology of tachycardia.) ? ?Do feel they are appropriate for psychiatry at this time but will continue to monitor level of withdrawal.   ? ? ? ? ? ?  ?Gareth Morgan, MD ?11/16/21 [phone] ? ?
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
## 2021-11-17 ENCOUNTER — Other Ambulatory Visit: Payer: Self-pay

## 2021-11-17 NOTE — Tx Team (Signed)
Initial Treatment Plan ?11/17/2021 ?1:47 AM ?[name] ?HQP:[id number] ? ? ? ?PATIENT STRESSORS: ?Financial difficulties   ?Medication change or noncompliance   ?Substance abuse   ? ? ?PATIENT STRENGTHS: ?Ability for insight  ?Average or above average intelligence  ?General fund of knowledge  ?Motivation for treatment/growth  ?Supportive family/friends  ? ? ?PATIENT IDENTIFIED PROBLEMS: ?  ?depression  ?anxiety  ?Suicidal ideation  ?Medication noncompliance  ?  ?  ?  ?  ?  ? ?DISCHARGE CRITERIA:  ?Ability to meet basic life and health needs ?Adequate post-discharge living arrangements ?Improved stabilization in mood, thinking, and/or behavior ?Motivation to continue treatment in a less acute level of care ?Verbal commitment to aftercare and medication compliance ? ?PRELIMINARY DISCHARGE PLAN: ?Attend aftercare/continuing care group ?Attend 12-step recovery group ? ?PATIENT/FAMILY INVOLVEMENT: ?This treatment plan has been presented to and reviewed with the patient, [name],  The patient and family have been given the opportunity to ask questions and make suggestions. ? ?JEHU-APPIAH, Megan Salon, RN ?11/17/2021, 1:47 AM ?

## 2021-11-17 NOTE — [facility] Suicide Risk Assessment (Signed)
Suicide Risk Assessment ? ?Admission Assessment    ?[name] Suicide Risk Assessment ? ? ?Demographic factors:  Male to male transitioning patient ?Current Mental Status:  Suicidal ideation indicated by patient, Suicide plan, Intention to act on suicide plan, Belief that plan would result in death ?Loss Factors:  Decline in vocational status, financial hardship ?Historical Factors:  Prior suicide attempts, Impulsivity, multiple inpatient psychiatric admissions, medical diagnoses ?Risk Reduction Factors:  Sense of responsibility to family ? ?Total Time spent with patient: 30 minutes ? ?Principal Problem: Alcohol abuse ?Diagnosis:  Principal Problem: ?  Alcohol abuse ? ?Subjective Data:  ?[name] is a 41 year old male to male transgender patient with PPHx of bipolar disorder, MDD with psychotic features, cocaine use disorder, alcohol use disorder, GAD, PTSD, alcohol withdrawal seizures, and multiple prior suicide attempts who presented to [facility] 4/30 after an intentional suicide attempt via alcohol ingestion. [name] has been admitted to [facility] multiple times, with most recent admission 09/13/21 for a suicide attempt via pill ingestion. ? ?Continued Clinical Symptoms:  ?  ?The "Alcohol Use Disorders Identification Test", Guidelines for Use in Primary Care, Second Edition.  World Pharmacologist [facility]). ?Score between 0-7:  no or low risk or alcohol related problems. ?Score between 8-15:  moderate risk of alcohol related problems. ?Score between 16-19:  high risk of alcohol related problems. ?Score 20 or above:  warrants further diagnostic evaluation for alcohol dependence and treatment. ? ? ?CLINICAL FACTORS:  ? Depression:   Comorbid alcohol abuse/dependence ?Hopelessness ?Impulsivity ?Insomnia ?Severe ?Alcohol/Substance Abuse/Dependencies ?Unstable or Poor Therapeutic Relationship ?Previous Psychiatric Diagnoses and Treatments ?Medical Diagnoses and Treatments/Surgeries ? ? ?Musculoskeletal: ?Strength &  Muscle Tone:  UTA, patient lethargic, lying in bed ?Gait & Station:  "  " ?Patient leans:  "  " ? ?Psychiatric Specialty Exam: ? ?Presentation  ?General Appearance: Casual ? ?Eye Contact:Fleeting ? ?Speech:Slow; Garbled ? ?Speech Volume:Decreased ? ?Handedness:Right ? ? ?Mood and Affect  ?Mood:Dysphoric ? ?Affect:Congruent; Depressed (Lethargic, so difficult to assess) ? ? ?Thought Process  ?Thought Processes:-- (UTA 2/2 patient lethargy) ? ?Descriptions of Associations:-- (UTA 2/2 patient lethargy) ? ?Orientation:-- (UTA 2/2 patient lethargy) ? ?Thought Content:-- (UTA 2/2 patient lethargy) ? ?History of Schizophrenia/Schizoaffective disorder:No ? ?Duration of Psychotic Symptoms:Less than six months ? ?Hallucinations:Hallucinations: Visual (Denies further command hallucinations) ?Description of Visual Hallucinations: Reports seeing black spots all around ? ?Ideas of Reference:-- (UTA 2/2 patient lethargy) ? ?Suicidal Thoughts:Suicidal Thoughts: Yes, Passive ?SI Passive Intent and/or Plan: Without Plan ? ?Homicidal Thoughts:Homicidal Thoughts: No ? ? ?Sensorium  ?Memory:-- (UTA 2/2 patient lethargy) ? ?Judgment:-- (UTA 2/2 patient lethargy) ? ?Insight:-- (UTA 2/2 patient lethargy) ? ? ?Executive Functions  ?Concentration:-- (UTA 2/2 patient lethargy) ? ?Attention Span:-- (UTA 2/2 patient lethargy) ? ?Recall:-- (UTA 2/2 patient lethargy) ? ?Fund of Knowledge:-- (UTA 2/2 patient lethargy) ? ?Language:-- (UTA 2/2 patient lethargy) ? ? ?Psychomotor Activity  ?Psychomotor Activity:Psychomotor Activity: Decreased; Psychomotor Retardation (Patient lying in bed with slowed movements) ? ? ?Assets  ?Assets:-- (UTA 2/2 patient lethargy) ? ? ?Sleep  ?Sleep:Sleep: Poor (Poor prior to admission) ? ? ? ?Physical Exam: ?Physical Exam See H&P ?ROS See H&P ?Blood pressure 92/66, pulse 76, temperature 97.8 ?F (36.6 ?C), temperature source Oral, resp. rate 17, height '5\' 6"'$  (1.676 m), weight 69.4 kg, SpO2 100 %. Body mass index is 24.69  kg/m?. ? ? ?COGNITIVE FEATURES THAT CONTRIBUTE TO RISK:  ?Polarized thinking   ? ?SUICIDE RISK:  ? Moderate:  Frequent suicidal ideation with limited intensity, and duration, some  specificity in terms of plans, no associated intent, good self-control, limited dysphoria/symptomatology, some risk factors present, and identifiable protective factors, including available and accessible social support. ? ?PLAN OF CARE:  ?Treatment Plan Summary: ?Daily contact with patient to assess and evaluate symptoms and progress in treatment and Medication management ?  ?Observation Level/Precautions:  Detox ?15 minute checks  ?Laboratory:  CBC ?Chemistry Profile ?UDS  ?Psychotherapy:    ?Medications:    ?Consultations:    ?Discharge Concerns:    ?Estimated LOS:  ?Other:    ?  ?Safety and Monitoring: ?INVOLUTARILY (initiated at [facility]) admission to inpatient psychiatric unit for safety, stabilization and treatment ?Daily contact with patient to assess and evaluate symptoms and progress in treatment ?[name] case to be discussed in multi-disciplinary team meeting ?Observation Level : q15 minute checks ?Vital signs: q12 hours ?Precautions: suicide, elopement, and assault ?  ?2. Psychiatric Problems ?#Major depressive disorder, recurrent, severe, with psychotic features ?#Suicide attempt via alcohol ingestion ?-Continue home Abilify 5 mg; will titrate as tolerated. ?-Continue home buspirone 10 mg TID for anxiety ?-Continue home Lamictal 25 mg daily for mood stabilization ?-Continue home propranolol 10 mg TID for akathisia. ?-Continue home Zoloft 50 mg daily for depression ?-Continue home hydroxyzine 25 mg TID for anxiety ?  ?#Alcohol Use Disorder, severe, dependent ?-CIWA protocol for monitoring of withdrawal with po thiamine and MVI replacement and Ativan '1mg'$  for scores >10 ?-Ativan taper  ?-Continue home gabapentin 600 mg TID for cravings and sx of alcohol WD. ?-Continue Ensure supplements for poor PO intake while patient  detoxing. ? ?I certify that inpatient services furnished can reasonably be expected to improve the patient's condition.  ? ?Rosezetta Schlatter, MD ?11/17/2021, 4:44 PM ?

## 2021-11-17 NOTE — Progress Notes (Signed)
Patient ID: [name], adult   DOB: 1980/11/17, 41 y.o.   MRN: [id number] ?Admission note: ?Patient is an Involuntary admission in no acute distress for ? Suicidal ideation with plan to overdose or drink self to death. Pt reports he drinks about a gallon of chardonnay or hand sanitizer. Pt stated he has been binge drinking for about three days and off his medication for about week. Pt admitted to unit per protocol, skin assessment and belonging search done. No skin issues noted. Consent signed by pt. Pt educated on therapeutic milieu rules. Pt was introduced to milieu by nursing staff. Fall risk / suicide safety plan explained to the patient. 15 minutes checks started for safety. ? ?
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
## 2021-11-17 NOTE — Group Note (Signed)
Recreation Therapy Group Note ? ? ?Group Topic:Animal Assisted Therapy   ?Group Date: 11/17/2021 ?Start Time: 1430 ?End Time: 9295 ?Facilitators: Victorino Sparrow, LRT,CTRS ?Location: [place] ? ? ?Animal-Assisted Activity (AAA) Program Checklist/Progress Note ?Patient Eligibility Criteria Checklist & Daily Group note for Rec Tx Intervention ? ?AAA/T Program Assumption of Risk Form signed by Patient/ or Parent Legal Guardian YES ? ?Patient understands their participation is voluntary YES ? ? ?Group Description: Patients provided opportunity to interact with trained and credentialed [facility] [employer] Therapy dog and the community volunteer/dog handler. Patients practiced appropriate animal interaction and were educated on dog safety outside of the hospital in common community settings. Patients were allowed to use dog toys and other items to practice commands, engage the dog in play, and/or complete routine aspects of animal care.  ? ?Education: Contractor, Pensions consultant, Communication & Social Skills  ? ? ?Affect/Mood: N/A ?  ?Participation Level: Did not attend ?  ? ?Clinical Observations/Individualized Feedback:   ? ? ?Plan: Continue to engage patient in RT group sessions 2-3x/week. ? ? ?Victorino Sparrow, LRT,CTRS ?11/17/2021 3:55 PM ?
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
## 2021-11-17 NOTE — Progress Notes (Signed)
Pt presents with depressed mood,affect congruent. Patient states they are not feeling suicidal currently, goal is to manage detox and '' get back on my medications '' Patient in the am is cooperative and calm, isolative to room. Patient CIWA completed at 1040 and patient noted to be very diaphoretic, with saturated clothing. Patient given gatorade, encouraged to uncover from blankets and medicated per CIWA protocol. Patient has been isolative to room, refusing to come to med window and in active withdrawal at this time. Po fluids encouraged and MD notified of above. Patient denies any SI or HI , no signs patient is responding to any A/V Hallucinations and no signs of altered sensorium at this time. Pt is safe.  ?

## 2021-11-17 NOTE — [facility] Group Notes (Signed)
Adult Psychoeducational Group Note ? ?Date:  11/17/2021 ?Time:  10:20 AM ? ?Group Topic/Focus:  ?Orientation:   The focus of this group is to educate the patient on the purpose and policies of crisis stabilization and provide a format to answer questions about their admission.  The group details unit policies and expectations of patients while admitted. ? ?Participation Level:  Did Not Attend ? ?Kern Reap ?11/17/2021, 10:20 AM ?

## 2021-11-17 NOTE — Progress Notes (Signed)
Adult Psychoeducational Group Note ? ?Date:  11/17/2021 ?Time:  8:53 PM ? ?Group Topic/Focus:  ?Wrap-Up Group:   The focus of this group is to help patients review their daily goal of treatment and discuss progress on daily workbooks. ? ?Participation Level:  Did Not Attend ? ?Participation Quality:   Did Not Attend ? ?Affect:   Did Not Attend ? ?Cognitive:   Did Not Attend ? ?Insight: None ? ?Engagement in Group:   Did Not Attend ? ?Modes of Intervention:   Did Not Attend ? ?Additional Comments:  Pt was encouraged to attend wrap up group but did not attend. ? ?Candy Sledge ?11/17/2021, 8:53 PM ?

## 2021-11-17 NOTE — Progress Notes (Signed)
NUTRITION ASSESSMENT ? ?Pt identified as at risk on the Malnutrition Screen Tool ? ?INTERVENTION: ?1. Supplements: Ensure Plus High Protein po BID, each supplement provides 350 kcal and 20 grams of protein.  ? ?NUTRITION DIAGNOSIS: ?Unintentional weight loss related to sub-optimal intake as evidenced by pt report.  ? ?Goal: ?Pt to meet >/= 90% of their estimated nutrition needs. ? ?Monitor:  ?PO intake ? ?Assessment:  ?Pt admitted with SI. Pt has been drinking Purell PTA. ?Pt reports using cocaine PTA. ?Patient had reported decreased appetite.  ?Per weight records, pt has lost 13 lbs since 08/05/21 (7% wt loss x 3.5 months, significant for time frame).  ?Ensure supplements have been ordered, will continue.  ? ? ? ?Height: ?Ht Readings from Last 1 Encounters:  ?11/16/21 '5\' 6"'$  (1.676 m)  ? ? ?Weight: ?Wt Readings from Last 1 Encounters:  ?11/16/21 69.4 kg  ? ? ?Weight Hx: ?Wt Readings from Last 10 Encounters:  ?11/16/21 69.4 kg  ?10/17/21 72.6 kg  ?09/13/21 72.6 kg  ?09/07/21 72.6 kg  ?08/27/21 72.6 kg  ?08/05/21 75.2 kg  ?07/23/21 69.4 kg  ?04/09/21 69.5 kg  ?03/19/21 66.3 kg  ?03/03/21 67.5 kg  ? ? ?BMI:  Body mass index is 24.69 kg/m?Marland Kitchen ?Pt meets criteria for normal based on current BMI. ? ?Estimated Nutritional Needs: ?Kcal: 25-30 kcal/kg ?Protein: > 1 gram protein/kg ?Fluid: 1 ml/kcal ? ?Diet Order:  ?Diet Order   ? ?       ?  Diet regular Room service appropriate? Yes; Fluid consistency: Thin  Diet effective now       ?  ? ?  ?  ? ?  ? ?Pt is also offered choice of unit snacks mid-morning and mid-afternoon.  ?Pt is eating as desired.  ? ?Lab results and medications reviewed.  ? ?Clayton Bibles, MS, RD, LDN ?Inpatient Clinical Dietitian ?Contact information available via Amion ? ? ?

## 2021-11-18 ENCOUNTER — Encounter (HOSPITAL_COMMUNITY): Payer: Self-pay

## 2021-11-18 MED ORDER — ARIPIPRAZOLE 10 MG PO TABS
10.0000 mg | ORAL_TABLET | Freq: Every day | ORAL | Status: DC
  Administered 2021-11-19: 10 mg via ORAL
  Filled 2021-11-18 (×2): qty 1

## 2021-11-18 NOTE — Progress Notes (Signed)
Pt reports feeling weak and dizzy. Vitals were assessed and were within his baseline. Encouraged pt to drink fluids and pt was provided Gatorade. Pt rested in bed and didn't attend group. Pt did complain of some withdrawal symptoms: sweating, a headache, AH telling him to kill himself and that's he's "worthless," and VH of his brother in his room. See CIWA flow sheet. Pt said that he plans to move out of [place] to stay sober because he hangs out with his friends a lot who also abuse alcohol and other drugs. Pt denies SI/HI and verbally contracts for safety. Active listening, reassurance, and support provided. Q 15 min safety checks continue. Pt's safety has been maintained. ? ? 11/17/21 2102  ?Psych Admission Type (Psych Patients Only)  ?Admission Status Involuntary  ?Psychosocial Assessment  ?Patient Complaints Substance abuse;Depression  ?Eye Contact Brief  ?Facial Expression Anxious  ?Affect Appropriate to circumstance;Depressed  ?Speech Logical/coherent;Soft;Slurred  ?Interaction Assertive  ?Motor Activity Slow  ?Appearance/Hygiene Disheveled  ?Behavior Characteristics Cooperative;Appropriate to situation;Anxious  ?Mood Depressed;Anxious;Pleasant  ?Thought Process  ?Coherency WDL  ?Content Blaming self  ?Delusions None reported or observed  ?Perception Hallucinations  ?Hallucination Auditory;Visual  ?Judgment Impaired  ?Confusion None  ?Danger to Self  ?Current suicidal ideation? Denies  ?Danger to Others  ?Danger to Others None reported or observed  ? ? ?

## 2021-11-18 NOTE — [facility] Group Notes (Signed)
PT was notified about group but did not attend.  ?

## 2021-11-18 NOTE — Progress Notes (Signed)
?   11/18/21 1130  ?Psych Admission Type (Psych Patients Only)  ?Admission Status Involuntary  ?Psychosocial Assessment  ?Patient Complaints None  ?Eye Contact Brief  ?Facial Expression Sad  ?Affect Anxious  ?Speech Logical/coherent;Slurred;Soft  ?Interaction Assertive  ?Motor Activity Slow  ?Appearance/Hygiene Disheveled  ?Behavior Characteristics Cooperative;Appropriate to situation  ?Mood Depressed;Anxious  ?Thought Process  ?Coherency WDL  ?Content WDL  ?Delusions None reported or observed  ?Perception WDL  ?Hallucination None reported or observed  ?Judgment Impaired  ?Confusion WDL  ?Danger to Self  ?Current suicidal ideation? Denies  ?Danger to Others  ?Danger to Others None reported or observed  ? ? ?

## 2021-11-18 NOTE — Group Note (Signed)
Recreation Therapy Group Note ? ? ?Group Topic:Team Building  ?Group Date: 11/18/2021 ?Start Time: 0930 ?End Time: 1000 ?Facilitators: Victorino Sparrow, LRT,CTRS ?Location: [place] ? ? ?Goal Area(s) Addresses:  ?Patient will effectively work with peer towards shared goal.  ?Patient will identify skills used to make activity successful.  ?Patient will identify how skills used during activity can be applied to reach post d/c goals.  ? ?Group Description: Tallest [employer]. In teams of 3-4, patients were given 25 small craft pipe cleaners. Using the materials provided, patients were instructed to compete again the opposing team(s) to build the tallest free-standing structure from floor level. The activity was timed; difficulty increased by Probation officer as Pharmacist, hospital continued.  Systematically resources were removed with additional directions for example, placing one arm behind their back, working in silence, and shape stipulations. LRT facilitated post-activity discussion reviewing team processes and necessary communication skills involved in completion. Patients were encouraged to reflect how the skills utilized, or not utilized, in this activity can be incorporated to positively impact support systems post discharge. ? ? ?Affect/Mood: N/A ?  ?Participation Level: Did not attend ?  ? ?Clinical Observations/Individualized Feedback:   ? ? ?Plan: Continue to engage patient in RT group sessions 2-3x/week. ? ? ?Victorino Sparrow, LRT,CTRS ?11/18/2021 12:49 PM ?

## 2021-11-18 NOTE — BH IP Treatment Plan (Signed)
Interdisciplinary Treatment and Diagnostic Plan Update ? ?11/18/2021 ?Time of Session: 9:45am  ?[name] ?MRN: [id number] ? ?Principal Diagnosis: Severe recurrent major depressive disorder with psychotic features ([place]) ? ?Secondary Diagnoses: Principal Problem: ?  Severe recurrent major depressive disorder with psychotic features ([place]) ?Active Problems: ?  Alcohol abuse ?  Suicide attempt [facility]) ? ? ?Current Medications:  ?Current Facility-Administered Medications  ?Medication Dose Route Frequency Provider Last Rate Last Admin  ? acetaminophen (TYLENOL) tablet 650 mg  650 mg Oral Q6H PRN Charmaine Downs C, NP   650 mg at 11/18/21 0620  ? alum & mag hydroxide-simeth (MAALOX/MYLANTA) 200-200-20 MG/5ML suspension 30 mL  30 mL Oral Q4H PRN Charmaine Downs C, NP      ? [START ON 11/19/2021] ARIPiprazole (ABILIFY) tablet 10 mg  10 mg Oral Daily Cosby, Loma Sousa, MD      ? haloperidol (HALDOL) tablet 5 mg  5 mg Oral Q6H PRN Delfin Gant, NP      ? And  ? benztropine (COGENTIN) tablet 1 mg  1 mg Oral Q6H PRN Charmaine Downs C, NP      ? busPIRone (BUSPAR) tablet 10 mg  10 mg Oral TID Charmaine Downs C, NP   10 mg at 11/18/21 1257  ? feeding supplement (ENSURE ENLIVE / ENSURE PLUS) liquid 237 mL  237 mL Oral BID BM Nelda Marseille, [name] E, MD   237 mL at 11/18/21 0830  ? gabapentin (NEURONTIN) capsule 600 mg  600 mg Oral TID Charmaine Downs C, NP   600 mg at 11/18/21 1257  ? hydrOXYzine (ATARAX) tablet 25 mg  25 mg Oral TID Charmaine Downs C, NP   25 mg at 11/18/21 1257  ? lamoTRIgine (LAMICTAL) tablet 25 mg  25 mg Oral Daily Charmaine Downs C, NP   25 mg at 11/18/21 3295  ? LORazepam (ATIVAN) injection 0-4 mg  0-4 mg Intravenous Q12H Delfin Gant, NP      ? Or  ? LORazepam (ATIVAN) tablet 0-4 mg  0-4 mg Oral Q12H Onuoha, Josephine C, NP   1 mg at 11/18/21 1884  ? magnesium hydroxide (MILK OF MAGNESIA) suspension 30 mL  30 mL Oral Daily PRN Charmaine Downs C, NP      ? pantoprazole (PROTONIX) EC tablet 40  mg  40 mg Oral Daily Charmaine Downs C, NP   40 mg at 11/18/21 1660  ? rosuvastatin (CRESTOR) tablet 10 mg  10 mg Oral QHS Nelda Marseille, [name] E, MD   10 mg at 11/17/21 2102  ? sertraline (ZOLOFT) tablet 50 mg  50 mg Oral Daily Charmaine Downs C, NP   50 mg at 11/18/21 6301  ? thiamine tablet 100 mg  100 mg Oral Daily Charmaine Downs C, NP   100 mg at 11/18/21 6010  ? Or  ? thiamine (B-1) injection 100 mg  100 mg Intravenous Daily Charmaine Downs C, NP      ? traZODone (DESYREL) tablet 300 mg  300 mg Oral QHS PRN Charmaine Downs C, NP   300 mg at 11/17/21 2102  ? ?PTA Medications: ?Medications Prior to Admission  ?Medication Sig Dispense Refill Last Dose  ? amLODipine (NORVASC) 10 MG tablet Take 1 tablet (10 mg total) by mouth daily. 30 tablet 0   ? ARIPiprazole (ABILIFY) 5 MG tablet Take 1 tablet (5 mg total) by mouth daily. 30 tablet 0   ? busPIRone (BUSPAR) 10 MG tablet Take 1 tablet (10 mg total) by mouth 3 (three) times daily. 90 tablet 0   ?  gabapentin (NEURONTIN) 300 MG capsule Take 2 capsules (600 mg total) by mouth 3 (three) times daily. 180 capsule 0   ? lamoTRIgine (LAMICTAL) 25 MG tablet Take 1 tablet (25 mg total) by mouth daily. 30 tablet 0   ? Multiple Vitamins-Minerals (MULTI ADULT GUMMIES PO) Take 2 tablets by mouth daily.     ? pantoprazole (PROTONIX) 40 MG tablet Take 1 tablet (40 mg total) by mouth daily. 30 tablet 0   ? propranolol (INDERAL) 10 MG tablet Take 1 tablet (10 mg total) by mouth 3 (three) times daily. 90 tablet 0   ? rosuvastatin (CRESTOR) 10 MG tablet Take 1 tablet (10 mg total) by mouth at bedtime. 30 tablet 0   ? sertraline (ZOLOFT) 50 MG tablet Take 1 tablet (50 mg total) by mouth daily. 30 tablet 0   ? trazodone (DESYREL) 300 MG tablet Take 1 tablet (300 mg total) by mouth at bedtime as needed for sleep (Insomnia). (Patient taking differently: Take 300 mg by mouth at bedtime.) 30 tablet 0   ? ? ?Patient Stressors: Financial difficulties   ?Medication change or noncompliance    ?Substance abuse   ? ?Patient Strengths: Ability for insight  ?Average or above average intelligence  ?General fund of knowledge  ?Motivation for treatment/growth  ?Supportive family/friends  ? ?Treatment Modalities: Medication Management, Group therapy, Case management,  ?1 to 1 session with clinician, Psychoeducation, Recreational therapy. ? ? ?Physician Treatment Plan for Primary Diagnosis: Severe recurrent major depressive disorder with psychotic features ([place]) ?Long Term Goal(s): Improvement in symptoms so as ready for discharge  ? ?Short Term Goals: Ability to identify changes in lifestyle to reduce recurrence of condition will improve ?Ability to verbalize feelings will improve ?Ability to demonstrate self-control will improve ?Ability to identify and develop effective coping behaviors will improve ?Ability to maintain clinical measurements within normal limits will improve ?Compliance with prescribed medications will improve ?Ability to identify triggers associated with substance abuse/mental health issues will improve ?Ability to disclose and discuss suicidal ideas ? ?Medication Management: Evaluate patient's response, side effects, and tolerance of medication regimen. ? ?Therapeutic Interventions: 1 to 1 sessions, Unit Group sessions and Medication administration. ? ?Evaluation of Outcomes: Not Met ? ?Physician Treatment Plan for Secondary Diagnosis: Principal Problem: ?  Severe recurrent major depressive disorder with psychotic features ([place]) ?Active Problems: ?  Alcohol abuse ?  Suicide attempt [facility]) ? ?Long Term Goal(s): Improvement in symptoms so as ready for discharge  ? ?Short Term Goals: Ability to identify changes in lifestyle to reduce recurrence of condition will improve ?Ability to verbalize feelings will improve ?Ability to demonstrate self-control will improve ?Ability to identify and develop effective coping behaviors will improve ?Ability to maintain clinical measurements within normal  limits will improve ?Compliance with prescribed medications will improve ?Ability to identify triggers associated with substance abuse/mental health issues will improve ?Ability to disclose and discuss suicidal ideas    ? ?Medication Management: Evaluate patient's response, side effects, and tolerance of medication regimen. ? ?Therapeutic Interventions: 1 to 1 sessions, Unit Group sessions and Medication administration. ? ?Evaluation of Outcomes: Not Met ? ? ?RN Treatment Plan for Primary Diagnosis: Severe recurrent major depressive disorder with psychotic features ([place]) ?Long Term Goal(s): Knowledge of disease and therapeutic regimen to maintain health will improve ? ?Short Term Goals: Ability to remain free from injury will improve, Ability to participate in decision making will improve, Ability to verbalize feelings will improve, Ability to disclose and discuss suicidal ideas, and Ability to identify and  develop effective coping behaviors will improve ? ?Medication Management: RN will administer medications as ordered by provider, will assess and evaluate patient's response and provide education to patient for prescribed medication. RN will report any adverse and/or side effects to prescribing provider. ? ?Therapeutic Interventions: 1 on 1 counseling sessions, Psychoeducation, Medication administration, Evaluate responses to treatment, Monitor vital signs and CBGs as ordered, Perform/monitor CIWA, COWS, AIMS and Fall Risk screenings as ordered, Perform wound care treatments as ordered. ? ?Evaluation of Outcomes: Not Met ? ? ?LCSW Treatment Plan for Primary Diagnosis: Severe recurrent major depressive disorder with psychotic features ([place]) ?Long Term Goal(s): Safe transition to appropriate next level of care at discharge, Engage patient in therapeutic group addressing interpersonal concerns. ? ?Short Term Goals: Engage patient in aftercare planning with referrals and resources, Increase social support, Increase  emotional regulation, Facilitate acceptance of mental health diagnosis and concerns, Identify triggers associated with mental health/substance abuse issues, and Increase skills for wellness and recovery ?
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
## 2021-11-18 NOTE — [facility] Counselor (Signed)
CSW provided this patient with resources for shelters and [facility] and encouraged patient to call to seek placement.  ? ? ? ? ?Darletta Moll MSW, LCSW ?Clincal Social Worker  ?[facility]  ?

## 2021-11-18 NOTE — [facility] Suicide Risk Assessment (Signed)
[facility] INPATIENT:  Family/Significant Other Suicide Prevention Education ? ?Suicide Prevention Education:  ?Education Completed;  mother, [name] [phone]), has been identified by the patient as the family member/significant other with whom the patient will be residing, and identified as the person(s) who will aid the patient in the event of a mental health crisis (suicidal ideations/suicide attempt).  With written consent from the patient, the family member/significant other has been provided the following suicide prevention education, prior to the and/or following the discharge of the patient. ? ? ?She states in the past 2 weeks he lost his job which caused him to lose his housing. He started coming to her house in the middle of the night and was only half-dressed. She called EMS which, he walked away from. ? She states his BP was running high. He was at church walking around and towards different people in the church while there was a speaker. They all prayed for him. She states he had been drinking alcohol. He then began making statements of "why should I live."  ? ?She wants him to be in a long term facility. She states he is unable to return to live with her at discharge.  ? ? ?The suicide prevention education provided includes the following: ?Suicide risk factors ?Suicide prevention and interventions ?National Suicide Hotline telephone number ?[facility] assessment telephone number ?[facility] Emergency Assistance 911 ?[place] and/or Residential Mobile Crisis Unit telephone number ? ?Request made of family/significant other to: ?Remove weapons (e.g., guns, rifles, knives), all items previously/currently identified as safety concern.   ?Remove drugs/medications (over-the-counter, prescriptions, illicit drugs), all items previously/currently identified as a safety concern. ? ?The family member/significant other verbalizes understanding of the suicide prevention education  information provided.  The family member/significant other agrees to remove the items of safety concern listed above. ? ?[name] ?11/18/2021, 11:17 AM ?

## 2021-11-18 NOTE — Group Note (Signed)
LCSW Group Therapy Note ? ? ?Group Date: 11/18/2021 ?Start Time: 1300 ?End Time: 1400 ? ? ?Type of Therapy and Topic:  Group Therapy:  Self-Care after Hospitalization ? ?Participation Level:  Did Not Attend  ? ?Description of Group ?This process group involved patients discussing how they plan to take care of themselves in a better manner when they get home from the hospital.  The group started with patients listing one healthy and one unhealthy way they took care of themselves prior to hospitalization.  A discussion ensued about the differences in healthy and unhealthy coping skills.  Group members shared ideas about making changes when they return home so that they can stay well and in recovery.  The white board was used to list ideas so that patients can continue to see these ideas throughout the day. ? ?Therapeutic Goals ?Patient will identify and describe one healthy and one unhealthy coping technique used prior to hospitalization ?Patient will participate in generating ideas about healthy self-care options when they return to the community ?Patients will be supportive of one another and receive said support from others ?Patient will identify one healthy self-care activity to add to his/her post-hospitalization life that can help in recovery ? ?Summary of Patient Progress:  Did not attend. ? ? ?Therapeutic Modalities ?Brief Solution-Focused Therapy ?Motivational Interviewing ?Psychoeducation ? ?Darleen Crocker, LCSWA ?11/18/2021  1:59 PM   ? ?

## 2021-11-19 DIAGNOSIS — F1010 Alcohol abuse, uncomplicated: Secondary | ICD-10-CM

## 2021-11-19 LAB — LIPID PANEL
Cholesterol: 280 mg/dL — ABNORMAL HIGH (ref 0–200)
HDL: 68 mg/dL (ref >40)
LDL Cholesterol: 177 mg/dL — ABNORMAL HIGH (ref 0–99)
Total CHOL/HDL Ratio: 4.1 RATIO
Triglycerides: 176 mg/dL — ABNORMAL HIGH (ref <150)
VLDL: 35 mg/dL (ref 0–40)

## 2021-11-19 MED ORDER — ARIPIPRAZOLE 10 MG PO TABS
10.0000 mg | ORAL_TABLET | Freq: Every day | ORAL | 0 refills | Status: DC
Start: 2021-11-20 — End: 2021-12-19

## 2021-11-19 MED ORDER — ROSUVASTATIN CALCIUM 40 MG PO TABS: 40.0000 mg | ORAL_TABLET | Freq: Every day | ORAL | 0 refills | Status: DC

## 2021-11-19 NOTE — Discharge Instructions (Signed)
Activity: as tolerated ? ?Diet: heart healthy ? ?Other: ?-Follow-up with your outpatient psychiatric provider -instructions on appointment date, time, and address (location) are provided to you in discharge paperwork. ? ?-Take your psychiatric medications as prescribed at discharge - instructions are provided to you in the discharge paperwork ? ?-Follow-up with outpatient primary care doctor and other specialists -for management of chronic medical disease: Discuss when to restart home blood pressure medications. ? ?-Testing: Follow-up with outpatient provider for abnormal lab results: LDL 177, Cholesterol 280 (rosuvastatin increased; recheck in approx 1 month), AST 88 (likely due to alcohol; recheck at hospital follow-up visit with primary care provider). ? ?-Recommend abstinence from alcohol, tobacco, and other illicit drug use at discharge.  ? ?-If your psychiatric symptoms recur, worsen, or if you have side effects to your psychiatric medications, call your outpatient psychiatric provider, 911, [phone] or go to the nearest emergency department. ? ?-If suicidal thoughts recur, call your outpatient psychiatric provider, 911, 988 or go to the nearest emergency department. ?

## 2021-11-19 NOTE — Plan of Care (Signed)
?  Problem: Education: Goal: Emotional status will improve Outcome: Not Progressing Goal: Mental status will improve Outcome: Not Progressing   

## 2021-11-19 NOTE — Progress Notes (Signed)
?  [facility] Adult Case Management Discharge Plan : ? ?Will you be returning to the same living situation after discharge:  Yes,  to boarding house ?At discharge, do you have transportation home?: No. Bus passes will be provided ?Do you have the ability to pay for your medications: Yes,  has insurance  ? ?Release of information consent forms completed and in the chart;  [name] signature needed at discharge. ? ?Patient to Follow up at: ? Follow-up Information   ? ? [place]. Go on 12/08/2021.   ?Specialty: Behavioral Health ?Why: You have an appointment on 12/08/21 at 9:00 am for medication management services.  You may go for therapy services on walk in days:  Mondays or Wednesdays, arrive at 7:30 am. ?Contact information: ?[address] ?[place] [address] ?[phone] ? ?  ?  ? ? [facility] Go on 11/23/2021.   ?Specialty: Behavioral Health ?Why: You have an appointment for the [place] (substance abuse intensive outpatient program) for therapy on 11/23/21 at 11:00 am.  This appointment will be held in person and will one to one and a half hours.  Please bring your insurance card with you. ?Contact information: ?[address] ?[place] [address] ?[phone] ? ? ?  ?  ? ? [place] Follow up.   ?Specialty: Addiction Medicine ?Why: Referral made ?Contact information: ?[address] ?[place] [address] ?[phone] ? ? ?  ?  ? ? [facility] Follow up.   ?Why: Referral made ?Contact information: ?[place] ?[place] [address] ?[phone] ? ? ?  ?  ? ?  ?  ? ?  ? ? ?Next level of care provider has access to [facility] ? ?Safety Planning and Suicide Prevention discussed: Yes,  with mother ? ?  ? ?Has patient been referred to the Quitline?: Patient refused referral ? ?Patient has been referred for addiction treatment: Yes ? ?Vassie Moselle, LCSW ?11/19/2021,  9:39 AM ?

## 2021-11-19 NOTE — Progress Notes (Signed)
This was charted in error. Pt does not have a legal guardian ?

## 2021-11-19 NOTE — [facility] Suicide Risk Assessment (Addendum)
Suicide Risk Assessment ? ?Discharge Assessment    ?[facility] Discharge Suicide Risk Assessment ? ? ?Principal Problem: Severe recurrent major depressive disorder with psychotic features ([place]) ?Discharge Diagnoses: Principal Problem: ?  Severe recurrent major depressive disorder with psychotic features ([place]) ?Active Problems: ?  Alcohol abuse ?  Suicide attempt [facility]) ? ? ?Total Time spent with patient: 20 minutes ?Hence Derrick is a 41 year old male to male transgender patient with PPHx of bipolar disorder, MDD with psychotic features, cocaine use disorder, alcohol use disorder, GAD, PTSD, alcohol withdrawal seizures, and multiple prior suicide attempts who presented to [facility] 4/30 after an intentional suicide attempt via alcohol ingestion. Suzanne has been admitted to [facility] multiple times, with most recent admission 09/13/21 for a suicide attempt via pill ingestion. ? ?During the patient's hospitalization, patient had extensive initial psychiatric evaluation, and follow-up psychiatric evaluations every day. ? ?Psychiatric diagnoses provided upon initial assessment:  ?#Major depressive disorder, recurrent, severe, with psychotic features ?#Suicide attempt via alcohol ingestion ?#Alcohol Use Disorder, severe, dependent ? ?[name] psychiatric medications were adjusted on admission:  ?-Continue home Abilify 5 mg; will titrate as tolerated. ?-Continue home buspirone 10 mg TID for anxiety ?-Continue home Lamictal 25 mg daily for mood stabilization ?-Continue home propranolol 10 mg TID for akathisia. ?-Continue home Zoloft 50 mg daily for depression ?-Continue home hydroxyzine 25 mg TID for anxiety ?-CIWA protocol for monitoring of withdrawal with po thiamine and MVI replacement and Ativan '1mg'$  for scores >10 ?-Ativan taper  ?-Continue home gabapentin 600 mg TID for cravings and sx of alcohol WD. ? ?During the hospitalization, other adjustments were made to the patient's psychiatric medication regimen:  ?-Abilify increased to 10  mg ?-Propranolol held d/t soft BP; restarted upon discharge due to tachycardia ? ?[name] care was discussed during the interdisciplinary team meeting every day during the hospitalization. ? ?The patient denies having side effects to prescribed psychiatric medication. ? ?Gradually, patient started adjusting to milieu. The patient was evaluated each day by a clinical provider to ascertain response to treatment. Improvement was noted by the patient's report of decreasing symptoms, improved sleep and appetite, affect, medication tolerance, behavior, and participation in unit programming.  Patient was asked each day to complete a self inventory noting mood, mental status, pain, new symptoms, anxiety and concerns.   ?Symptoms were reported as significantly decreased or resolved completely by discharge.  ?The patient reports that their mood is stable.  ?The patient denied having suicidal thoughts for more than 48 hours prior to discharge.  Patient denies having homicidal thoughts.  Patient denies having auditory hallucinations.  Patient denies any visual hallucinations or other symptoms of psychosis.  ?The patient was motivated to continue taking medication with a goal of continued improvement in mental health.  ? ?The patient reports their target psychiatric symptoms of alcohol withdrawal, medication optimization, and SI responded well to the psychiatric medications, and the patient reports overall benefit other psychiatric hospitalization. Supportive psychotherapy was provided to the patient. The patient also participated in regular group therapy while hospitalized. Coping skills, problem solving as well as relaxation therapies were also part of the unit programming. ? ?Labs were reviewed with the patient, and abnormal results were discussed with the patient. ? ?The patient is able to verbalize their individual safety plan to this provider. ?  ? ?Musculoskeletal: ?Strength & Muscle Tone: within normal limits ?Gait &  Station: normal ?Patient leans: N/A ? ?Psychiatric Specialty Exam ? ?Presentation  ?General Appearance: Appropriate for Environment; Casual; Fairly Groomed ? ?Eye Contact:Good ? ?  Speech:Normal Rate ? ?Speech Volume:Normal ? ?Handedness:Right ? ? ?Mood and Affect  ?Mood:Euthymic ? ?Duration of Depression Symptoms: Greater than two weeks ? ?Affect:Congruent; Appropriate ? ? ?Thought Process  ?Thought Processes:Linear; Goal Directed ? ?Descriptions of Associations:Intact ? ?Orientation:Full (Time, Place and Person) ? ?Thought Content:Logical; WDL ? ?History of Schizophrenia/Schizoaffective disorder:No ? ?Duration of Psychotic Symptoms:Less than six months ? ?Hallucinations:Hallucinations: None ?Description of Auditory Hallucinations: Heard brother's voice, then believed that it was the TV heard to room at end of hall ?Description of Visual Hallucinations: Denies today ? ?Ideas of Reference:None ? ?Suicidal Thoughts:Suicidal Thoughts: No ? ?Homicidal Thoughts:Homicidal Thoughts: No ? ? ?Sensorium  ?Memory:Immediate Fair; Recent Fair ? ?Judgment:Good ? ?Insight:Fair ? ? ?Executive Functions  ?Concentration:Good ? ?Attention Span:Good ? ?Recall:Good ? ?Fund of [place] ? ?Language:Good ? ? ?Psychomotor Activity  ?Psychomotor Activity:Psychomotor Activity: Normal ? ? ?Assets  ?Assets:Communication Skills; Desire for Improvement; Housing; Resilience ? ? ?Sleep  ?Sleep:Sleep: Good ? ? ?Physical Exam: ?Physical Exam See discharge summary ?ROS See discharge summary ?Blood pressure 101/83, pulse (!) 114, temperature 98.4 ?F (36.9 ?C), temperature source Oral, resp. rate 17, height '5\' 6"'$  (1.676 m), weight 69.4 kg, SpO2 98 %. Body mass index is 24.69 kg/m?. ? ?Mental Status Per Nursing Assessment::   ?On Admission:    ? ?Demographic Factors:  ?Male, Abner Greenspan, lesbian, or bisexual orientation, Low socioeconomic status, and Unemployed; male to male transitioning ? ?Loss Factors: ?Decrease in vocational status and Financial  problems/change in socioeconomic status ? ?Historical Factors: ?Prior suicide attempts and Impulsivity ? ?Risk Reduction Factors:   ?Sense of responsibility to family, Living with another person, especially a relative, and Positive social support ? ?Continued Clinical Symptoms:  ?Alcohol/Substance Abuse/Dependencies ?Unstable or Poor Therapeutic Relationship ?Previous Psychiatric Diagnoses and Treatments ?Medical Diagnoses and Treatments/Surgeries ? ?Cognitive Features That Contribute To Risk:  ?None   ? ?Suicide Risk:  ?Mild: There are no identifiable plans, no associated intent, mild dysphoria and related symptoms, good self-control (both objective and subjective assessment), few other risk factors, and identifiable protective factors, including available and accessible social support. ? ? Follow-up Information   ? ? [place]. Go on 12/08/2021.   ?Specialty: Behavioral Health ?Why: You have an appointment on 12/08/21 at 9:00 am for medication management services.  You may go for therapy services on walk in days:  Mondays or Wednesdays, arrive at 7:30 am. ?Contact information: ?[address] ?[place] [address] ?[phone] ? ?  ?  ? ? [facility]. Go on 11/23/2021.   ?Specialty: Behavioral Health ?Why: You have an appointment for the [place] (substance abuse intensive outpatient program) for therapy on 11/23/21 at 11:00 am.  This appointment will be held in person and will one to one and a half hours.  Please bring your insurance card with you. ?Contact information: ?[address] ?[place] [address] ?[phone] ? ? ?  ?  ? ? [place] Follow up.   ?Specialty: Addiction Medicine ?Why: Referral made ?Contact information: ?[address] ?[place] [address] ?[phone] ? ? ?  ?  ? ? [facility] Follow up.   ?Why: Referral made ?Contact information: ?[place] ?[place] [address] ?[phone] ? ? ?  ?  ? ?  ?  ? ?  ? ? ?Plan Of Care/Follow-up recommendations:  ?Activity: as tolerated ? ?Diet: heart healthy ? ?Other: ?-Follow-up with your outpatient psychiatric provider
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
## 2021-11-19 NOTE — Progress Notes (Signed)
? ? ?   11/18/21 2106  ?Psych Admission Type (Psych Patients Only)  ?Admission Status Involuntary  ?Psychosocial Assessment  ?Patient Complaints None  ?Eye Contact Brief  ?Facial Expression Flat  ?Affect Anxious  ?Speech Logical/coherent;Slurred;Soft  ?Interaction Assertive  ?Motor Activity Slow  ?Appearance/Hygiene Disheveled  ?Behavior Characteristics Cooperative  ?Mood Pleasant;Anxious  ?Thought Process  ?Coherency WDL  ?Content WDL  ?Delusions None reported or observed  ?Perception WDL  ?Hallucination None reported or observed  ?Judgment Impaired  ?Confusion WDL  ?Danger to Self  ?Current suicidal ideation? Denies  ?Danger to Others  ?Danger to Others None reported or observed  ? ? ?

## 2021-11-19 NOTE — Progress Notes (Signed)
Order received for patients discharge. Discharge instructions reviewed at length with the patient, including AVS, medications, and crisis services. Patient denies any SI/HI  OR A/V Hallucinations and states they feel safe about discharge. Patient states they understand importance of maintaining sobriety. Patient Discharge SRA printed and AVS copy provided as well as review of medications. All belongings returned. Patient provided bus pass and escorted from unit to lobby.  ?

## 2021-12-08 ENCOUNTER — Telehealth (HOSPITAL_COMMUNITY): Payer: Commercial Managed Care - HMO | Admitting: Psychiatry

## 2021-12-08 ENCOUNTER — Encounter (HOSPITAL_COMMUNITY): Payer: Self-pay

## 2021-12-13 ENCOUNTER — Encounter (HOSPITAL_COMMUNITY): Payer: Self-pay

## 2021-12-13 ENCOUNTER — Other Ambulatory Visit: Payer: Self-pay

## 2021-12-13 ENCOUNTER — Emergency Department (HOSPITAL_COMMUNITY): Payer: 59

## 2021-12-13 ENCOUNTER — Emergency Department (HOSPITAL_COMMUNITY)
Admission: EM | Admit: 2021-12-13 | Discharge: 2021-12-15 | Disposition: A | Discharge status: Other Acute Inpatient Hospital | Payer: 59 | Attending: Emergency Medicine | Admitting: Emergency Medicine

## 2021-12-13 DIAGNOSIS — Z1330 Encounter for screening examination for mental health and behavioral disorders, unspecified: Secondary | ICD-10-CM | POA: Diagnosis not present

## 2021-12-13 DIAGNOSIS — F10129 Alcohol abuse with intoxication, unspecified: Secondary | ICD-10-CM | POA: Insufficient documentation

## 2021-12-13 DIAGNOSIS — T43292A Poisoning by other antidepressants, intentional self-harm, initial encounter: Secondary | ICD-10-CM | POA: Diagnosis not present

## 2021-12-13 DIAGNOSIS — R7401 Elevation of levels of liver transaminase levels: Secondary | ICD-10-CM | POA: Diagnosis not present

## 2021-12-13 DIAGNOSIS — T1491XA Suicide attempt, initial encounter: Secondary | ICD-10-CM | POA: Diagnosis not present

## 2021-12-13 DIAGNOSIS — F1410 Cocaine abuse, uncomplicated: Secondary | ICD-10-CM | POA: Diagnosis not present

## 2021-12-13 DIAGNOSIS — F333 Major depressive disorder, recurrent, severe with psychotic symptoms: Secondary | ICD-10-CM | POA: Insufficient documentation

## 2021-12-13 DIAGNOSIS — T50902A Poisoning by unspecified drugs, medicaments and biological substances, intentional self-harm, initial encounter: Secondary | ICD-10-CM

## 2021-12-13 DIAGNOSIS — F1020 Alcohol dependence, uncomplicated: Secondary | ICD-10-CM | POA: Diagnosis not present

## 2021-12-13 DIAGNOSIS — R45851 Suicidal ideations: Secondary | ICD-10-CM | POA: Insufficient documentation

## 2021-12-13 DIAGNOSIS — Z20822 Contact with and (suspected) exposure to covid-19: Secondary | ICD-10-CM | POA: Insufficient documentation

## 2021-12-13 DIAGNOSIS — F10929 Alcohol use, unspecified with intoxication, unspecified: Secondary | ICD-10-CM

## 2021-12-13 DIAGNOSIS — E875 Hyperkalemia: Secondary | ICD-10-CM | POA: Insufficient documentation

## 2021-12-13 DIAGNOSIS — X58XXXA Exposure to other specified factors, initial encounter: Secondary | ICD-10-CM | POA: Diagnosis not present

## 2021-12-13 LAB — COMPREHENSIVE METABOLIC PANEL
ALT: 102 U/L — ABNORMAL HIGH (ref 0–44)
AST: 115 U/L — ABNORMAL HIGH (ref 15–41)
Albumin: 4.5 g/dL (ref 3.5–5.0)
Alkaline Phosphatase: 69 U/L (ref 38–126)
Anion gap: 14 (ref 5–15)
BUN: 12 mg/dL (ref 6–20)
CO2: 26 mmol/L (ref 22–32)
Calcium: 9.7 mg/dL (ref 8.9–10.3)
Chloride: 99 mmol/L (ref 98–111)
Creatinine, Ser: 0.89 mg/dL (ref 0.61–1.24)
GFR, Estimated: >60 mL/min (ref >60)
Glucose, Bld: 96 mg/dL (ref 70–99)
Potassium: 3.8 mmol/L (ref 3.5–5.1)
Sodium: 139 mmol/L (ref 135–145)
Total Bilirubin: 0.4 mg/dL (ref 0.3–1.2)
Total Protein: 8.3 g/dL — ABNORMAL HIGH (ref 6.5–8.1)

## 2021-12-13 LAB — SARS CORONAVIRUS 2 BY RT PCR: SARS Coronavirus 2 by RT PCR: NEGATIVE

## 2021-12-13 LAB — CBC
HCT: 53.2 % — ABNORMAL HIGH (ref 39.0–52.0)
Hemoglobin: 17.8 g/dL — ABNORMAL HIGH (ref 13.0–17.0)
MCH: 28.8 pg (ref 26.0–34.0)
MCHC: 33.5 g/dL (ref 30.0–36.0)
MCV: 85.9 fL (ref 80.0–100.0)
Platelets: 181 10*3/uL (ref 150–400)
RBC: 6.19 MIL/uL — ABNORMAL HIGH (ref 4.22–5.81)
RDW: 14.1 % (ref 11.5–15.5)
WBC: 5.7 10*3/uL (ref 4.0–10.5)
nRBC: 0 % (ref 0.0–0.2)

## 2021-12-13 LAB — RAPID URINE DRUG SCREEN, HOSP PERFORMED
Amphetamines: NOT DETECTED
Barbiturates: NOT DETECTED
Benzodiazepines: NOT DETECTED
Cocaine: POSITIVE — AB
Opiates: NOT DETECTED
Tetrahydrocannabinol: NOT DETECTED

## 2021-12-13 LAB — ACETAMINOPHEN LEVEL: Acetaminophen (Tylenol), Serum: <10 ug/mL — ABNORMAL LOW (ref 10–30)

## 2021-12-13 LAB — ETHANOL: Alcohol, Ethyl (B): 275 mg/dL — ABNORMAL HIGH (ref <10)

## 2021-12-13 LAB — MAGNESIUM: Magnesium: 2.1 mg/dL (ref 1.7–2.4)

## 2021-12-13 LAB — SALICYLATE LEVEL: Salicylate Lvl: <7 mg/dL — ABNORMAL LOW (ref 7.0–30.0)

## 2021-12-13 MED ORDER — SODIUM CHLORIDE 0.9 % IV BOLUS
1000.0000 mL | Freq: Once | INTRAVENOUS | Status: AC
  Administered 2021-12-13: 1000 mL via INTRAVENOUS

## 2021-12-13 MED ORDER — ONDANSETRON 4 MG PO TBDP
4.0000 mg | ORAL_TABLET | Freq: Once | ORAL | Status: AC
  Administered 2021-12-13: 4 mg via ORAL
  Filled 2021-12-13: qty 1

## 2021-12-13 MED ORDER — SODIUM CHLORIDE 0.9 % IV SOLN: INTRAVENOUS | Status: DC

## 2021-12-13 NOTE — ED Triage Notes (Addendum)
Pt states that he took a bunch of Trazodone prescribed to him, trying to kill himself.

## 2021-12-13 NOTE — ED Provider Notes (Signed)
Blood pressure (!) 130/92, pulse (!) 110, temperature 98.4 F (36.9 C), temperature source Oral, resp. rate 19, SpO2 99 %.  Assuming care from Dr. [name].  In short, [name] is a 41 y.o. adult with a chief complaint of Suicidal and Ingestion .  Refer to the original H&P for additional details.  The current plan of care is to patient med clear at 1 AM.   EKG Interpretation  Date/Time:  Sunday Dec 13 2021 23:45:54 EDT Ventricular Rate:  97 PR Interval:  134 QRS Duration: 76 QT Interval:  340 QTC Calculation: 432 R Axis:   69 Text Interpretation: Sinus rhythm Borderline T wave abnormalities Confirmed by Nanda Quinton [phone]) on 12/13/2021 11:49:21 PM       Repeat EKG without prolonged QT. No hypotension or bradycardia.  01:06 AM  Patient is med clear for TTS evaluation.    Margette Fast, MD 12/14/21 [phone]

## 2021-12-13 NOTE — ED Provider Notes (Signed)
[facility] DEPT Provider Note   CSN: [id number] Arrival date & time: 12/13/21  2019     History  Chief Complaint  Patient presents with   Suicidal   Ingestion    [name] is a 41 y.o. adult.  HPI Patient presenting for evaluation intentional trazodone overdose.  She is a male to male transgender patient.  She has multiple suicide attempts, most recently admitted for same, 4 weeks ago.  She has been receiving outpatient intensive behavioral health therapy.  She has a history of polysubstance abuse.  She states that about an hour ago she took a "bunch of trazodone pills, drink 1/2 gallon of wine, and smoked a gram of cocaine."  She is unable to provide any further details.    Home Medications Prior to Admission medications   Medication Sig Start Date End Date Taking? Authorizing Provider  ARIPiprazole (ABILIFY) 10 MG tablet Take 1 tablet (10 mg total) by mouth daily. 11/20/21   Rosezetta Schlatter, MD  busPIRone (BUSPAR) 10 MG tablet Take 1 tablet (10 mg total) by mouth 3 (three) times daily. 09/21/21   Massengill, Ovid Curd, MD  gabapentin (NEURONTIN) 300 MG capsule Take 2 capsules (600 mg total) by mouth 3 (three) times daily. 09/20/21 11/16/22  Delfin Gant, NP  lamoTRIgine (LAMICTAL) 25 MG tablet Take 1 tablet (25 mg total) by mouth daily. 09/21/21 11/16/22  Delfin Gant, NP  Multiple Vitamins-Minerals (MULTI ADULT GUMMIES PO) Take 2 tablets by mouth daily.    [provider]  pantoprazole (PROTONIX) 40 MG tablet Take 1 tablet (40 mg total) by mouth daily. 09/21/21   Massengill, Ovid Curd, MD  propranolol (INDERAL) 10 MG tablet Take 1 tablet (10 mg total) by mouth 3 (three) times daily. 09/20/21 11/16/22  Delfin Gant, NP  rosuvastatin (CRESTOR) 40 MG tablet Take 1 tablet (40 mg total) by mouth at bedtime. 11/19/21 12/19/21  Rosezetta Schlatter, MD  sertraline (ZOLOFT) 50 MG tablet Take 1 tablet (50 mg total) by mouth daily. 09/21/21 11/16/22  Delfin Gant, NP  trazodone (DESYREL) 300 MG tablet Take 1 tablet (300 mg total) by mouth at bedtime as needed for sleep (Insomnia). Patient taking differently: Take 300 mg by mouth at bedtime. 09/21/21   Massengill, Ovid Curd, MD      Allergies    Codeine, Penicillins, Morphine, Coconut (cocos nucifera), Coconut flavor, Grapefruit concentrate, Morphine and related, Oxycodone, and Norco [hydrocodone-acetaminophen]    Review of Systems   Review of Systems  Physical Exam Updated Vital Signs BP (!) 130/92   Pulse (!) 110   Temp 98.4 F (36.9 C) (Oral)   Resp 19   SpO2 99%  Physical Exam  ED Results / Procedures / Treatments   Labs (all labs ordered are listed, but only abnormal results are displayed) Labs Reviewed  COMPREHENSIVE METABOLIC PANEL - Abnormal; Notable for the following components:      Result Value   Total Protein 8.3 (*)    AST 115 (*)    ALT 102 (*)    All other components within normal limits  ETHANOL - Abnormal; Notable for the following components:   Alcohol, Ethyl (B) 275 (*)    All other components within normal limits  SALICYLATE LEVEL - Abnormal; Notable for the following components:   Salicylate Lvl <2.0 (*)    All other components within normal limits  ACETAMINOPHEN LEVEL - Abnormal; Notable for the following components:   Acetaminophen (Tylenol), Serum <10 (*)    All other  components within normal limits  CBC - Abnormal; Notable for the following components:   RBC 6.19 (*)    Hemoglobin 17.8 (*)    HCT 53.2 (*)    All other components within normal limits  RAPID URINE DRUG SCREEN, HOSP PERFORMED - Abnormal; Notable for the following components:   Cocaine POSITIVE (*)    All other components within normal limits  SARS CORONAVIRUS 2 BY RT PCR  MAGNESIUM    EKG EKG Interpretation  Date/Time:  Sunday Dec 13 2021 20:30:35 EDT Ventricular Rate:  129 PR Interval:  83 QRS Duration: 83 QT Interval:  319 QTC Calculation: 468 R Axis:   74 Text  Interpretation: Sinus tachycardia Borderline T wave abnormalities Baseline wander in lead(s) III aVL Since last tracing rate faster Confirmed by Daleen Bo [phone]) on 12/13/2021 8:48:05 PM  Radiology DG Chest Port 1 View  Result Date: 12/13/2021 CLINICAL DATA:  Trazodone overdose. EXAM: PORTABLE CHEST 1 VIEW COMPARISON:  Chest two views 10/28/2021 FINDINGS: Cardiac silhouette and mediastinal contours are within normal limits. A surgical clip again overlies the right hilum. The lungs are clear. No pleural effusion or pneumothorax. Superior approach plate and screw fixation of the lateral left clavicle with coracoclavicular ligament repair, similar to prior. IMPRESSION: No active disease. Electronically Signed   By: Yvonne Kendall M.D.   On: 12/13/2021 21:38    Procedures Procedures    Medications Ordered in ED Medications  sodium chloride 0.9 % bolus 1,000 mL (has no administration in time range)  0.9 %  sodium chloride infusion (has no administration in time range)  ondansetron (ZOFRAN-ODT) disintegrating tablet 4 mg (4 mg Oral Given 12/13/21 2218)    ED Course/ Medical Decision Making/ A&P                           Medical Decision Making Patient presenting with recurrent stated overdose to kill self.  She states that she has "lost my job, lost my house and lost my family."  She requires assessment for toxic ingestions, full support for evaluation of cardiac and respiratory status and will ultimately require psychiatric care.  Amount and/or Complexity of Data Reviewed Independent Historian:     Details: Cogent historian, somewhat sleepy but is able to give history. External Data Reviewed: notes.    Details: Recent hospitalization for suicide attempt by alcohol ingestion. Labs: ordered.    Details: CBC, metabolic panel, alcohol level, drug screen, Tylenol and aspirin levels-normal except protein elevated, AST high, ALT high, alcohol level high, hemoglobin high Radiology: ordered and  independent interpretation performed.    Details: Chest x-ray-no infiltrate or edema ECG/medicine tests: ordered and independent interpretation performed.    Details: Cardiac monitor-tachycardic sinus rhythm Discussion of management or test interpretation with external provider(s): Poison Control consult- 6 hour observation. Repeat EKG. Bicarb prn. K++ and Mag at high levels for Cardiac compromise.  Risk OTC drugs. Prescription drug management. Decision regarding hospitalization. Risk Details: Patient with intentional overdose, multiple substances including trazodone, cocaine and alcohol.  His alcohol intoxication on arrival.  No cardiovascular instability on arrival.  Initial screening labs are reassuring.  Suspect moderate dehydration/volume depletion due to presence of tachycardia and hemoconcentration.  This is a recurrent problem for this patient, multiple overdoses in the past.  Patient requires observation to ensure cardiovascular stability.  Plan at least 6 hours of observation from time of ingestion which will be completed at 1 AM.  At that point further observation will  be as needed Tylenol recovered to baseline.  Transition of care to Dr. Laverta Baltimore, at 11:30 PM.  Critical Care Total time providing critical care: 75 minutes          Final Clinical Impression(s) / ED Diagnoses Final diagnoses:  Intentional overdose, initial encounter [facility])  Suicide attempt [facility])  Alcoholic intoxication with complication ([place])  Cocaine abuse ([place])    Rx / DC Orders ED Discharge Orders     None         Daleen Bo, MD 12/14/21 [phone]

## 2021-12-13 NOTE — ED Notes (Signed)
Poison control notified. Recommendations: monitor for at least 6 hrs on cardiac monitor, repeat EKG before cleared, add magnesium level.

## 2021-12-14 DIAGNOSIS — F333 Major depressive disorder, recurrent, severe with psychotic symptoms: Secondary | ICD-10-CM

## 2021-12-14 LAB — RESP PANEL BY RT-PCR (FLU A&B, COVID) ARPGX2
Influenza A by PCR: NEGATIVE
Influenza B by PCR: NEGATIVE
SARS Coronavirus 2 by RT PCR: NEGATIVE

## 2021-12-14 LAB — ACETAMINOPHEN LEVEL: Acetaminophen (Tylenol), Serum: <10 ug/mL — ABNORMAL LOW (ref 10–30)

## 2021-12-14 MED ORDER — LORAZEPAM 1 MG PO TABS
0.0000 mg | ORAL_TABLET | Freq: Four times a day (QID) | ORAL | Status: DC
  Administered 2021-12-14 (×3): 2 mg via ORAL
  Administered 2021-12-15 (×2): 1 mg via ORAL
  Filled 2021-12-14 (×2): qty 2
  Filled 2021-12-14 (×2): qty 1
  Filled 2021-12-14: qty 2

## 2021-12-14 MED ORDER — LORAZEPAM 1 MG PO TABS: 0.0000 mg | ORAL_TABLET | Freq: Two times a day (BID) | ORAL | Status: DC

## 2021-12-14 MED ORDER — NICOTINE 21 MG/24HR TD PT24
21.0000 mg | MEDICATED_PATCH | Freq: Every day | TRANSDERMAL | Status: DC
  Filled 2021-12-14: qty 1

## 2021-12-14 MED ORDER — ALUM & MAG HYDROXIDE-SIMETH 200-200-20 MG/5ML PO SUSP: 30.0000 mL | Freq: Four times a day (QID) | ORAL | Status: DC | PRN

## 2021-12-14 MED ORDER — LORAZEPAM 2 MG/ML IJ SOLN
0.0000 mg | Freq: Four times a day (QID) | INTRAMUSCULAR | Status: DC
  Administered 2021-12-14: 2 mg via INTRAVENOUS
  Filled 2021-12-14: qty 1

## 2021-12-14 MED ORDER — LAMOTRIGINE 25 MG PO TABS
25.0000 mg | ORAL_TABLET | Freq: Every day | ORAL | Status: DC
  Administered 2021-12-14 – 2021-12-15 (×2): 25 mg via ORAL
  Filled 2021-12-14 (×2): qty 1

## 2021-12-14 MED ORDER — SERTRALINE HCL 50 MG PO TABS
50.0000 mg | ORAL_TABLET | Freq: Every day | ORAL | Status: DC
  Administered 2021-12-14 – 2021-12-15 (×2): 50 mg via ORAL
  Filled 2021-12-14 (×2): qty 1

## 2021-12-14 MED ORDER — LORAZEPAM 2 MG/ML IJ SOLN: 0.0000 mg | Freq: Two times a day (BID) | INTRAMUSCULAR | Status: DC

## 2021-12-14 MED ORDER — TRAZODONE HCL 100 MG PO TABS
300.0000 mg | ORAL_TABLET | Freq: Every evening | ORAL | Status: DC | PRN
  Administered 2021-12-14 – 2021-12-15 (×2): 300 mg via ORAL
  Filled 2021-12-14 (×3): qty 3

## 2021-12-14 MED ORDER — ONDANSETRON 4 MG PO TBDP
4.0000 mg | ORAL_TABLET | Freq: Once | ORAL | Status: AC
  Administered 2021-12-14: 4 mg via ORAL
  Filled 2021-12-14: qty 1

## 2021-12-14 MED ORDER — ARIPIPRAZOLE 10 MG PO TABS
10.0000 mg | ORAL_TABLET | Freq: Every day | ORAL | Status: DC
  Administered 2021-12-14 – 2021-12-15 (×2): 10 mg via ORAL
  Filled 2021-12-14 (×2): qty 1

## 2021-12-14 MED ORDER — THIAMINE HCL 100 MG PO TABS
100.0000 mg | ORAL_TABLET | Freq: Every day | ORAL | Status: DC
  Administered 2021-12-14 – 2021-12-15 (×2): 100 mg via ORAL
  Filled 2021-12-14 (×2): qty 1

## 2021-12-14 MED ORDER — GABAPENTIN 300 MG PO CAPS
600.0000 mg | ORAL_CAPSULE | Freq: Three times a day (TID) | ORAL | Status: DC
  Administered 2021-12-14 – 2021-12-15 (×4): 600 mg via ORAL
  Filled 2021-12-14 (×4): qty 2

## 2021-12-14 MED ORDER — ACETAMINOPHEN 325 MG PO TABS
650.0000 mg | ORAL_TABLET | ORAL | Status: DC | PRN
  Administered 2021-12-14 – 2021-12-15 (×2): 650 mg via ORAL
  Filled 2021-12-14 (×2): qty 2

## 2021-12-14 MED ORDER — BUSPIRONE HCL 10 MG PO TABS
10.0000 mg | ORAL_TABLET | Freq: Three times a day (TID) | ORAL | Status: DC
  Administered 2021-12-14 – 2021-12-15 (×2): 10 mg via ORAL
  Filled 2021-12-14 (×2): qty 1

## 2021-12-14 MED ORDER — THIAMINE HCL 100 MG/ML IJ SOLN: 100.0000 mg | Freq: Every day | INTRAMUSCULAR | Status: DC
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
## 2021-12-14 NOTE — Progress Notes (Signed)
CSW requested [facility] Wynonia Hazard, RN to review. CSW will assist and follow.  Benjaman Kindler, MSW, [facility] 12/14/2021 9:34 PM

## 2021-12-14 NOTE — BH Assessment (Signed)
TTS spoke to [employer] Pearline Cables RN), to put pt in a private room to complete TTS assessment.  Clinician to call the cart in five minutes.

## 2021-12-14 NOTE — BH Assessment (Signed)
Per RN, Pt is currently too somnolent to participate in tele-assessment. Pt will be assessed when he is alert.

## 2021-12-14 NOTE — Progress Notes (Signed)
Inpatient Behavioral Health Placement  Pt meets inpatient criteria per Charmaine Downs, NP. There are no available beds at [facility] per [facility] Wynonia Hazard, RN.  Referral was sent to the following facilities;    Destination Service Provider Address Phone Fax  [address] [place] [address] [phone]  [facility]  [place], [place] [address] [place]  [facility]  [address] [place] [address] [phone]  [facility]  [place], [place] [address] [phone]  [facility]  [address] [place] [address] [place]  [facility]  [address], [place] [address] [phone]  [place] [facility] [address] [place] [address] [phone]  [facility]  [place] [address] [phone] [place]  [address] [place] [address] [phone] [facility]  [address] [place] [address] [phone] [place]  [address], [place] [address] [phone] [facility]  [address], [place] [address] [phone] [facility]  [place], [name] [address] [phone]  [facility]  [address] [place] [address] [phone]  [facility]  [address] [place] [address] [phone]  [facility]  [address] [place]  [address] [phone]   Situation ongoing,  CSW will follow up.   Benjaman Kindler, MSW,  [facility] 12/14/2021  @ 11:23 PM
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
## 2021-12-14 NOTE — ED Provider Notes (Signed)
Emergency Medicine Observation Re-evaluation Note  [name] is a 41 y.o. adult, seen on rounds today.  Pt initially presented to the ED for complaints of Suicidal and Ingestion Currently, the patient is sleeping.  Physical Exam  BP (!) 139/98   Pulse 98   Temp 98.4 F (36.9 C) (Oral)   Resp 19   SpO2 97%  Physical Exam General: Sleeping Cardiac: Extremities well-perfused Lungs: Breathing is even and unlabored Psych: Deferred  ED Course / MDM  EKG:EKG Interpretation  Date/Time:  Sunday Dec 13 2021 23:45:54 EDT Ventricular Rate:  97 PR Interval:  134 QRS Duration: 76 QT Interval:  340 QTC Calculation: 432 R Axis:   69 Text Interpretation: Sinus rhythm Borderline T wave abnormalities Confirmed by Nanda Quinton [phone]) on 12/13/2021 11:49:21 PM  I have reviewed the labs performed to date as well as medications administered while in observation.  Recent changes in the last 24 hours include patient presented to the ED last night after an intentional trazodone overdose.  He was medically cleared at 1 AM.  Currently awaiting TTS evaluation.  Plan  Current plan is for TTS evaluation this morning.  [name] is not under involuntary commitment.     Godfrey Pick, MD 12/14/21 [phone]

## 2021-12-15 ENCOUNTER — Inpatient Hospital Stay (HOSPITAL_COMMUNITY)
Admission: AD | Admit: 2021-12-15 | Discharge: 2021-12-19 | DRG: 885 | Disposition: A | Discharge status: Home / Self Care | Payer: 59 | Source: Intra-hospital | Attending: Psychiatry | Admitting: Psychiatry

## 2021-12-15 DIAGNOSIS — T43212A Poisoning by selective serotonin and norepinephrine reuptake inhibitors, intentional self-harm, initial encounter: Secondary | ICD-10-CM | POA: Diagnosis present

## 2021-12-15 DIAGNOSIS — I10 Essential (primary) hypertension: Secondary | ICD-10-CM | POA: Diagnosis present

## 2021-12-15 DIAGNOSIS — K30 Functional dyspepsia: Secondary | ICD-10-CM | POA: Diagnosis present

## 2021-12-15 DIAGNOSIS — F419 Anxiety disorder, unspecified: Secondary | ICD-10-CM | POA: Diagnosis present

## 2021-12-15 DIAGNOSIS — G4700 Insomnia, unspecified: Secondary | ICD-10-CM | POA: Diagnosis present

## 2021-12-15 DIAGNOSIS — E7800 Pure hypercholesterolemia, unspecified: Secondary | ICD-10-CM | POA: Diagnosis present

## 2021-12-15 DIAGNOSIS — F1910 Other psychoactive substance abuse, uncomplicated: Secondary | ICD-10-CM | POA: Diagnosis present

## 2021-12-15 DIAGNOSIS — Z79899 Other long term (current) drug therapy: Secondary | ICD-10-CM

## 2021-12-15 DIAGNOSIS — F4010 Social phobia, unspecified: Secondary | ICD-10-CM | POA: Diagnosis present

## 2021-12-15 DIAGNOSIS — F333 Major depressive disorder, recurrent, severe with psychotic symptoms: Secondary | ICD-10-CM | POA: Diagnosis not present

## 2021-12-15 DIAGNOSIS — F4310 Post-traumatic stress disorder, unspecified: Secondary | ICD-10-CM | POA: Diagnosis present

## 2021-12-15 DIAGNOSIS — F1090 Alcohol use, unspecified, uncomplicated: Secondary | ICD-10-CM

## 2021-12-15 DIAGNOSIS — F14288 Cocaine dependence with other cocaine-induced disorder: Secondary | ICD-10-CM | POA: Diagnosis present

## 2021-12-15 DIAGNOSIS — F411 Generalized anxiety disorder: Secondary | ICD-10-CM | POA: Diagnosis present

## 2021-12-15 DIAGNOSIS — F17210 Nicotine dependence, cigarettes, uncomplicated: Secondary | ICD-10-CM | POA: Diagnosis present

## 2021-12-15 DIAGNOSIS — F1010 Alcohol abuse, uncomplicated: Secondary | ICD-10-CM | POA: Diagnosis present

## 2021-12-15 DIAGNOSIS — Z8249 Family history of ischemic heart disease and other diseases of the circulatory system: Secondary | ICD-10-CM

## 2021-12-15 DIAGNOSIS — T43592A Poisoning by other antipsychotics and neuroleptics, intentional self-harm, initial encounter: Secondary | ICD-10-CM | POA: Diagnosis present

## 2021-12-15 DIAGNOSIS — F315 Bipolar disorder, current episode depressed, severe, with psychotic features: Principal | ICD-10-CM | POA: Diagnosis present

## 2021-12-15 DIAGNOSIS — F1420 Cocaine dependence, uncomplicated: Secondary | ICD-10-CM | POA: Diagnosis present

## 2021-12-15 MED ORDER — LORAZEPAM 1 MG PO TABS: 0.0000 mg | ORAL_TABLET | Freq: Two times a day (BID) | ORAL | Status: DC

## 2021-12-15 MED ORDER — MAGNESIUM HYDROXIDE 400 MG/5ML PO SUSP: 30.0000 mL | Freq: Every day | ORAL | Status: DC | PRN

## 2021-12-15 MED ORDER — PROPRANOLOL HCL 10 MG PO TABS
10.0000 mg | ORAL_TABLET | Freq: Three times a day (TID) | ORAL | Status: DC
  Administered 2021-12-16 – 2021-12-19 (×10): 10 mg via ORAL
  Filled 2021-12-15 (×14): qty 1

## 2021-12-15 MED ORDER — BUSPIRONE HCL 10 MG PO TABS
10.0000 mg | ORAL_TABLET | Freq: Three times a day (TID) | ORAL | Status: DC
  Administered 2021-12-16 – 2021-12-19 (×10): 10 mg via ORAL
  Filled 2021-12-15 (×13): qty 1

## 2021-12-15 MED ORDER — TRAZODONE HCL 150 MG PO TABS: 300.0000 mg | ORAL_TABLET | Freq: Every evening | ORAL | Status: DC | PRN

## 2021-12-15 MED ORDER — ALUM & MAG HYDROXIDE-SIMETH 200-200-20 MG/5ML PO SUSP: 30.0000 mL | ORAL | Status: DC | PRN

## 2021-12-15 MED ORDER — THIAMINE HCL 100 MG PO TABS
100.0000 mg | ORAL_TABLET | Freq: Every day | ORAL | Status: DC
  Administered 2021-12-16 – 2021-12-19 (×4): 100 mg via ORAL
  Filled 2021-12-15 (×6): qty 1

## 2021-12-15 MED ORDER — LORAZEPAM 2 MG/ML IJ SOLN: 0.0000 mg | Freq: Two times a day (BID) | INTRAMUSCULAR | Status: DC

## 2021-12-15 MED ORDER — SERTRALINE HCL 50 MG PO TABS
50.0000 mg | ORAL_TABLET | Freq: Every day | ORAL | Status: DC
  Administered 2021-12-16 – 2021-12-19 (×4): 50 mg via ORAL
  Filled 2021-12-15 (×6): qty 1

## 2021-12-15 MED ORDER — THIAMINE HCL 100 MG/ML IJ SOLN: 100.0000 mg | Freq: Every day | INTRAMUSCULAR | Status: DC
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
## 2021-12-15 NOTE — ED Provider Notes (Signed)
Pt accepted to [facility], Dr Caswell Corwin, for transport there at 34 today.  Pt currently alert, content, no distress. Bp normal. Afebrile.  Pt appears stable for admission/transport to [facility].     Lajean Saver, MD 12/15/21 [phone]

## 2021-12-15 NOTE — ED Provider Notes (Signed)
Emergency Medicine Observation Re-evaluation Note  Rayaan Garguilo is a 41 y.o. adult, seen on rounds today.  Pt initially presented to the ED for complaints of Suicidal and Ingestion Currently, the patient is sleeping.  Physical Exam  BP 120/87 (BP Location: Left Arm)   Pulse (!) 102   Temp 98.5 F (36.9 C) (Oral)   Resp 18   SpO2 95%  Physical Exam Constitutional:      Appearance: He is not ill-appearing.  HENT:     Head: Normocephalic.  Psychiatric:        Mood and Affect: Mood normal.    ED Course / MDM  EKG:EKG Interpretation  Date/Time:  Sunday Dec 13 2021 23:45:54 EDT Ventricular Rate:  97 PR Interval:  134 QRS Duration: 76 QT Interval:  340 QTC Calculation: 432 R Axis:   69 Text Interpretation: Sinus rhythm Borderline T wave abnormalities Confirmed by Nanda Quinton [phone]) on 12/13/2021 11:49:21 PM  I have reviewed the labs performed to date as well as medications administered while in observation.  Recent changes in the last 24 hours include nothing.  Plan  Current plan is for inpatient treatment.  Christie Copley is not under involuntary commitment.     Lennice Sites, DO 12/15/21 [phone]

## 2021-12-15 NOTE — Tx Team (Signed)
Initial Treatment Plan 12/15/2021 11:22 PM Garlen Reinig YYT:[id number]    PATIENT STRESSORS: Financial difficulties   Marital or family conflict   Substance abuse     PATIENT STRENGTHS: General fund of knowledge  Motivation for treatment/growth  Supportive family/friends    PATIENT IDENTIFIED PROBLEMS: Risk for SI  Psychosis  ETOH  " Long Term Treatment Substance Abuse , medicine to stop drinking"               DISCHARGE CRITERIA:  Adequate post-discharge living arrangements Improved stabilization in mood, thinking, and/or behavior Withdrawal symptoms are absent or subacute and managed without 24-hour nursing intervention  PRELIMINARY DISCHARGE PLAN: Attend aftercare/continuing care group Attend PHP/IOP Attend 12-step recovery group Outpatient therapy  PATIENT/FAMILY INVOLVEMENT: This treatment plan has been presented to and reviewed with the patient, [name].  The patient and family have been given the opportunity to ask questions and make suggestions.  [name], RN 12/15/2021, 11:22 PM
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
## 2021-12-15 NOTE — Progress Notes (Signed)
[facility] LCSW Progress Note   12/15/2021    2:55 PM  [name]   [id number]   Type of Contact and Topic:  Psychiatric Bed Placement   Pt accepted to [facility] 304-1   Patient meets inpatient criteria per Merlyn Lot, NP  The attending provider will be Janine Limbo, MD   Call report to [phone]    Ruben Im, RN @ [facility] ED notified.     Pt scheduled  to arrive at [place] at 2200.   Mariea Clonts, MSW, LCSW-A  2:56 PM 12/15/2021

## 2021-12-16 ENCOUNTER — Other Ambulatory Visit: Payer: Self-pay

## 2021-12-16 ENCOUNTER — Encounter (HOSPITAL_COMMUNITY): Payer: Self-pay

## 2021-12-16 ENCOUNTER — Encounter (HOSPITAL_COMMUNITY): Payer: Self-pay | Admitting: Nurse Practitioner

## 2021-12-16 DIAGNOSIS — F315 Bipolar disorder, current episode depressed, severe, with psychotic features: Principal | ICD-10-CM

## 2021-12-16 MED ORDER — ADULT MULTIVITAMIN W/MINERALS CH
1.0000 | ORAL_TABLET | Freq: Every day | ORAL | Status: DC
  Administered 2021-12-16 – 2021-12-19 (×4): 1 via ORAL
  Filled 2021-12-16 (×6): qty 1

## 2021-12-16 MED ORDER — LORAZEPAM 1 MG PO TABS
1.0000 mg | ORAL_TABLET | Freq: Three times a day (TID) | ORAL | Status: AC
  Administered 2021-12-17 (×3): 1 mg via ORAL
  Filled 2021-12-16 (×3): qty 1

## 2021-12-16 MED ORDER — NICOTINE 7 MG/24HR TD PT24: 7.0000 mg | MEDICATED_PATCH | Freq: Every day | TRANSDERMAL | Status: DC | PRN

## 2021-12-16 MED ORDER — HYDROXYZINE HCL 25 MG PO TABS
25.0000 mg | ORAL_TABLET | Freq: Four times a day (QID) | ORAL | Status: AC | PRN
  Administered 2021-12-16 – 2021-12-18 (×3): 25 mg via ORAL
  Filled 2021-12-16 (×3): qty 1

## 2021-12-16 MED ORDER — LORAZEPAM 1 MG PO TABS
1.0000 mg | ORAL_TABLET | Freq: Two times a day (BID) | ORAL | Status: AC
  Administered 2021-12-18 (×2): 1 mg via ORAL
  Filled 2021-12-16 (×2): qty 1

## 2021-12-16 MED ORDER — ONDANSETRON 4 MG PO TBDP: 4.0000 mg | ORAL_TABLET | Freq: Four times a day (QID) | ORAL | Status: AC | PRN

## 2021-12-16 MED ORDER — TRAZODONE HCL 50 MG PO TABS
50.0000 mg | ORAL_TABLET | Freq: Every evening | ORAL | Status: DC | PRN
  Administered 2021-12-16 – 2021-12-18 (×3): 50 mg via ORAL
  Filled 2021-12-16 (×3): qty 1

## 2021-12-16 MED ORDER — ROSUVASTATIN CALCIUM 40 MG PO TABS
40.0000 mg | ORAL_TABLET | Freq: Every day | ORAL | Status: DC
  Administered 2021-12-17 – 2021-12-19 (×3): 40 mg via ORAL
  Filled 2021-12-16 (×4): qty 1

## 2021-12-16 MED ORDER — LOPERAMIDE HCL 2 MG PO CAPS: 2.0000 mg | ORAL_CAPSULE | ORAL | Status: AC | PRN

## 2021-12-16 MED ORDER — LORAZEPAM 1 MG PO TABS
1.0000 mg | ORAL_TABLET | Freq: Four times a day (QID) | ORAL | Status: AC
  Administered 2021-12-16 (×3): 1 mg via ORAL
  Filled 2021-12-16 (×3): qty 1

## 2021-12-16 MED ORDER — LORAZEPAM 1 MG PO TABS
1.0000 mg | ORAL_TABLET | Freq: Every day | ORAL | Status: AC
  Administered 2021-12-19: 1 mg via ORAL
  Filled 2021-12-16: qty 1

## 2021-12-16 MED ORDER — ARIPIPRAZOLE 5 MG PO TABS
5.0000 mg | ORAL_TABLET | Freq: Every day | ORAL | Status: DC
  Administered 2021-12-16: 5 mg via ORAL
  Filled 2021-12-16 (×2): qty 1

## 2021-12-16 MED ORDER — GABAPENTIN 300 MG PO CAPS
300.0000 mg | ORAL_CAPSULE | Freq: Three times a day (TID) | ORAL | Status: DC
  Administered 2021-12-16 – 2021-12-19 (×8): 300 mg via ORAL
  Filled 2021-12-16 (×11): qty 1

## 2021-12-16 MED ORDER — LORAZEPAM 1 MG PO TABS
1.0000 mg | ORAL_TABLET | Freq: Every day | ORAL | Status: AC
  Administered 2021-12-16: 1 mg via ORAL
  Filled 2021-12-16: qty 1

## 2021-12-16 MED ORDER — LAMOTRIGINE 25 MG PO TABS
25.0000 mg | ORAL_TABLET | Freq: Every day | ORAL | Status: DC
Start: 2021-12-17 — End: 2021-12-19
  Administered 2021-12-17 – 2021-12-19 (×3): 25 mg via ORAL
  Filled 2021-12-16 (×4): qty 1

## 2021-12-16 MED ORDER — THIAMINE HCL 100 MG PO TABS
100.0000 mg | ORAL_TABLET | Freq: Every day | ORAL | Status: DC
  Filled 2021-12-16 (×2): qty 1

## 2021-12-16 MED ORDER — ENSURE ENLIVE PO LIQD
237.0000 mL | Freq: Two times a day (BID) | ORAL | Status: DC
  Administered 2021-12-16 – 2021-12-19 (×5): 237 mL via ORAL
  Filled 2021-12-16 (×9): qty 237

## 2021-12-16 NOTE — Progress Notes (Signed)
NUTRITION ASSESSMENT  Pt identified as at risk on the Malnutrition Screen Tool  INTERVENTION: 1. Supplements: Ensure Plus High Protein po BID, each supplement provides 350 kcal and 20 grams of protein.  2. Multivitamin with minerals daily   NUTRITION DIAGNOSIS: Unintentional weight loss related to sub-optimal intake as evidenced by pt report.   Goal: Pt to meet >/= 90% of their estimated nutrition needs.  Monitor:  PO intake  Assessment:  Pt admitted for SA, ingesting buspar pills, alcohol and smoking cocaine. Pt with history polysubstance abuse.  Per weight records, pt has lost 10 lbs since 1/18 (6% wt loss x 4.5 months, insignificant for time frame). Ensure supplements have been ordered.  Thiamine has been ordered as well. Will add daily MVI.  Height: Ht Readings from Last 1 Encounters:  12/16/21 '5\' 6"'$  (1.676 m)    Weight: Wt Readings from Last 1 Encounters:  12/16/21 70.9 kg    Weight Hx: Wt Readings from Last 10 Encounters:  12/16/21 70.9 kg  11/16/21 69.4 kg  10/17/21 72.6 kg  09/13/21 72.6 kg  09/07/21 72.6 kg  08/27/21 72.6 kg  08/05/21 75.2 kg  07/23/21 69.4 kg  04/09/21 69.5 kg  03/19/21 66.3 kg    BMI:  Body mass index is 25.21 kg/m. Pt meets criteria for normal based on current BMI.  Estimated Nutritional Needs: Kcal: 25-30 kcal/kg Protein: > 1 gram protein/kg Fluid: 1 ml/kcal  Diet Order:  Diet Order             Diet regular Room service appropriate? Yes; Fluid consistency: Thin  Diet effective now                  Pt is also offered choice of unit snacks mid-morning and mid-afternoon.  Pt is eating as desired.   Lab results and medications reviewed.   Clayton Bibles, MS, RD, LDN Inpatient Clinical Dietitian Contact information available via Amion
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
## 2021-12-16 NOTE — BH IP Treatment Plan (Signed)
Interdisciplinary Treatment and Diagnostic Plan Update  12/16/2021 Time of Session: 9:05am  [name] MRN: [id number]  Principal Diagnosis: Severe recurrent major depressive disorder with psychotic features [facility])  Secondary Diagnoses: Principal Problem:   Severe recurrent major depressive disorder with psychotic features ([place])   Current Medications:  Current Facility-Administered Medications  Medication Dose Route Frequency Provider Last Rate Last Admin   alum & mag hydroxide-simeth (MAALOX/MYLANTA) 200-200-20 MG/5ML suspension 30 mL  30 mL Oral Q4H PRN Charmaine Downs C, NP       busPIRone (BUSPAR) tablet 10 mg  10 mg Oral TID Charmaine Downs C, NP   10 mg at 12/16/21 0943   feeding supplement (ENSURE ENLIVE / ENSURE PLUS) liquid 237 mL  237 mL Oral BID BM Lindon Romp A, NP   237 mL at 12/16/21 0831   LORazepam (ATIVAN) tablet 0-4 mg  0-4 mg Oral Q12H [place], Josephine C, NP       magnesium hydroxide (MILK OF MAGNESIA) suspension 30 mL  30 mL Oral Daily PRN Delfin Gant, NP       multivitamin with minerals tablet 1 tablet  1 tablet Oral Daily Massengill, Nathan, MD       propranolol (INDERAL) tablet 10 mg  10 mg Oral TID Charmaine Downs C, NP   10 mg at 12/16/21 0831   sertraline (ZOLOFT) tablet 50 mg  50 mg Oral Daily Charmaine Downs C, NP   50 mg at 12/16/21 0831   thiamine tablet 100 mg  100 mg Oral Daily Charmaine Downs C, NP   100 mg at 12/16/21 0831   traZODone (DESYREL) tablet 300 mg  300 mg Oral QHS PRN Delfin Gant, NP       PTA Medications: Medications Prior to Admission  Medication Sig Dispense Refill Last Dose   ARIPiprazole (ABILIFY) 10 MG tablet Take 1 tablet (10 mg total) by mouth daily. 30 tablet 0    busPIRone (BUSPAR) 10 MG tablet Take 1 tablet (10 mg total) by mouth 3 (three) times daily. 90 tablet 0    gabapentin (NEURONTIN) 300 MG capsule Take 2 capsules (600 mg total) by mouth 3 (three) times daily. 180 capsule 0    lamoTRIgine  (LAMICTAL) 25 MG tablet Take 1 tablet (25 mg total) by mouth daily. 30 tablet 0    propranolol (INDERAL) 10 MG tablet Take 1 tablet (10 mg total) by mouth 3 (three) times daily. (Patient not taking: Reported on 12/14/2021) 90 tablet 0    rosuvastatin (CRESTOR) 40 MG tablet Take 1 tablet (40 mg total) by mouth at bedtime. (Patient not taking: Reported on 12/14/2021) 30 tablet 0    sertraline (ZOLOFT) 50 MG tablet Take 1 tablet (50 mg total) by mouth daily. 30 tablet 0    trazodone (DESYREL) 300 MG tablet Take 1 tablet (300 mg total) by mouth at bedtime as needed for sleep (Insomnia). (Patient taking differently: Take 300 mg by mouth at bedtime.) 30 tablet 0     Patient Stressors: Financial difficulties   Marital or family conflict   Substance abuse    Patient Strengths: General fund of knowledge  Motivation for treatment/growth  Supportive family/friends   Treatment Modalities: Medication Management, Group therapy, Case management,  1 to 1 session with clinician, Psychoeducation, Recreational therapy.   Physician Treatment Plan for Primary Diagnosis: Severe recurrent major depressive disorder with psychotic features ([place]) Long Term Goal(s):     Short Term Goals:    Medication Management: Evaluate patient's response, side effects, and tolerance  of medication regimen.  Therapeutic Interventions: 1 to 1 sessions, Unit Group sessions and Medication administration.  Evaluation of Outcomes: Not Met  Physician Treatment Plan for Secondary Diagnosis: Principal Problem:   Severe recurrent major depressive disorder with psychotic features ([place])  Long Term Goal(s):     Short Term Goals:       Medication Management: Evaluate patient's response, side effects, and tolerance of medication regimen.  Therapeutic Interventions: 1 to 1 sessions, Unit Group sessions and Medication administration.  Evaluation of Outcomes: Not Met   RN Treatment Plan for Primary Diagnosis: Severe recurrent major  depressive disorder with psychotic features ([place]) Long Term Goal(s): Knowledge of disease and therapeutic regimen to maintain health will improve  Short Term Goals: Ability to remain free from injury will improve, Ability to participate in decision making will improve, Ability to verbalize feelings will improve, Ability to disclose and discuss suicidal ideas, and Ability to identify and develop effective coping behaviors will improve  Medication Management: RN will administer medications as ordered by provider, will assess and evaluate patient's response and provide education to patient for prescribed medication. RN will report any adverse and/or side effects to prescribing provider.  Therapeutic Interventions: 1 on 1 counseling sessions, Psychoeducation, Medication administration, Evaluate responses to treatment, Monitor vital signs and CBGs as ordered, Perform/monitor CIWA, COWS, AIMS and Fall Risk screenings as ordered, Perform wound care treatments as ordered.  Evaluation of Outcomes: Not Met   LCSW Treatment Plan for Primary Diagnosis: Severe recurrent major depressive disorder with psychotic features ([place]) Long Term Goal(s): Safe transition to appropriate next level of care at discharge, Engage patient in therapeutic group addressing interpersonal concerns.  Short Term Goals: Engage patient in aftercare planning with referrals and resources, Increase social support, Increase emotional regulation, Facilitate acceptance of mental health diagnosis and concerns, Identify triggers associated with mental health/substance abuse issues, and Increase skills for wellness and recovery  Therapeutic Interventions: Assess for all discharge needs, 1 to 1 time with Social worker, Explore available resources and support systems, Assess for adequacy in community support network, Educate family and significant other(s) on suicide prevention, Complete Psychosocial Assessment, Interpersonal group  therapy.  Evaluation of Outcomes: Not Met   Progress in Treatment: Attending groups: No. Participating in groups: No. Taking medication as prescribed: Yes. Toleration medication: Yes. Family/Significant other contact made: Yes, individual(s) contacted:  If consents are provided  Patient understands diagnosis: Yes. Discussing patient identified problems/goals with staff: Yes. Medical problems stabilized or resolved: Yes. Denies suicidal/homicidal ideation: Yes. Issues/concerns per patient self-inventory: No.   New problem(s) identified: No, Describe:  None   New Short Term/Long Term Goal(s): medication stabilization, elimination of SI thoughts, development of comprehensive mental wellness plan.   Patient Goals:  "To work on my triggers"  Discharge Plan or Barriers: Patient recently admitted. CSW will continue to follow and assess for appropriate referrals and possible discharge planning.   Reason for Continuation of Hospitalization: Depression Medication stabilization Suicidal ideation  Estimated Length of Stay: 3 to 7 days   Last [place] Suicide Severity Risk Score: [facility] Admission (Current) from 12/15/2021 in [facility] ED from 12/13/2021 in [facility] DEPT Admission (Discharged) from 11/16/2021 in [facility]  C-SSRS RISK CATEGORY High Risk High Risk High Risk       Last PHQ 2/9 Scores:    08/05/2021   11:22 AM 07/23/2021    5:59 PM 07/14/2021    1:13 PM  Depression screen  PHQ 2/9  Decreased Interest 2 3 0  Down, Depressed, Hopeless _0 PHQ - 2 Score _1 Altered sleeping _2 Tired, decreased energy 2 2 0  Change in appetite 1 2 0  Feeling bad or failure about yourself  1 3 0  Trouble concentrating 1 1 0  Moving slowly or fidgety/restless 1 2 0  Suicidal thoughts 0 3 0  PHQ-9 Score _3 Difficult doing work/chores Extremely dIfficult Very  difficult Not difficult at all    Scribe for Treatment Team: Darleen Crocker, Latanya Presser 12/16/2021 10:48 AM
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
## 2021-12-16 NOTE — [facility] Suicide Risk Assessment (Signed)
[place] INPATIENT:  Family/Significant Other Suicide Prevention Education  Suicide Prevention Education:  Education Completed; [name], mother, [phone] has been identified by the patient as the family member/significant other with whom the patient will be residing, and identified as the person(s) who will aid the patient in the event of a mental health crisis (suicidal ideations/suicide attempt).  With written consent from the patient, the family member/significant other has been provided the following suicide prevention education, prior to the and/or following the discharge of the patient.  In addition to the precautions below, patient's support agrees to monitor patient for safety, ensure treatment compliance, and alert emergency services should patient require such services. [name] mother states that he could be homeless depending on when he would be forced out of his boarding room.   The suicide prevention education provided includes the following: Suicide risk factors Suicide prevention and interventions National Suicide Hotline telephone number [facility] assessment telephone number [facility] Emergency Assistance [place] and/or Residential Mobile Crisis Unit telephone number  Request made of family/significant other to: Remove weapons (e.g., guns, rifles, knives), all items previously/currently identified as safety concern.   Remove drugs/medications (over-the-counter, prescriptions, illicit drugs), all items previously/currently identified as a safety concern.  The family member/significant other verbalizes understanding of the suicide prevention education information provided.  The family member/significant other agrees to remove the items of safety concern listed above.  Durenda Hurt 12/16/2021, 4:04 PM

## 2021-12-16 NOTE — Progress Notes (Signed)
Pt presents with depressed mood, affect congruent. [name] reports he is '' feeling really rough. Just down and withdrawing '' Patient states '' i'm tired and just feel like I need to rest. '' Pt states '' I relapsed because I really did not want to have to come back to [place], I was in [employer] and then things didn't work out with my friend and I had to come back here, and that triggered me . ''  Patient declined to exit room to get medications and requested that writer bring to room. Patient denies any AH/VH and no signs of acute altered sensorium. Patient given po fluids and encouraged hydration. Pt is safe, will con't to monitor.

## 2021-12-16 NOTE — Group Note (Signed)
Recreation Therapy Group Note   Group Topic:Team Building  Group Date: 12/16/2021 Start Time: 0930 End Time: 0956 Facilitators: Victorino Sparrow, LRT,CTRS Location: [address] Day[address]   Goal Area(s) Addresses:  Patient will effectively work with peer towards shared goal.  Patient will identify skills used to make activity successful.  Patient will identify how skills used during activity can be applied to reach post d/c goals.   Group Description: [employer]. In teams of 3-4, patients were given 25 small craft pipe cleaners. Using the materials provided, patients were instructed to compete again the opposing team(s) to build the tallest free-standing structure from floor level. The activity was timed; difficulty increased by Probation officer as Pharmacist, hospital continued.  Systematically resources were removed with additional directions for example, placing one arm behind their back, working in silence, and shape stipulations. LRT facilitated post-activity discussion reviewing team processes and necessary communication skills involved in completion. Patients were encouraged to reflect how the skills utilized, or not utilized, in this activity can be incorporated to positively impact support systems post discharge.   Affect/Mood: N/A   Participation Level: Did not attend    Clinical Observations/Individualized Feedback:     Plan: Continue to engage patient in RT group sessions 2-3x/week.   Victorino Sparrow, LRT,CTRS 12/16/2021 12:02 PM

## 2021-12-16 NOTE — [facility] Group Notes (Signed)
Pt did not attend psychoeducational group. 

## 2021-12-16 NOTE — Group Note (Signed)
[place] LCSW Group Therapy Note  Date/Time: 12/19/14 at 3:00pm  Type of Therapy and Topic:  Group Therapy:  Strengths and Qualities  Participation Level:  Did not attend    Description of Group:    In this group patients will be asked to explore and define the terms strength ans qualities.  Patients will be guided to discuss their thoughts, feelings, and behaviors as to where strengths and qualities originate. Participants will then list some of their strengths and qualities related to each subject topic. This group will be process-oriented, with patients participating in exploration of their own experiences as well as giving and receiving support and challenge from other group members.  Therapeutic Goals: Patient will identify specific strengths related to their personal life. Patient will identify feelings, thoughts, and beliefs about strengths and qualities. Patient will identify ways their strengths have been used. . Patient will identify situations where they have helped others or made someone else happy. .  Summary of Patient Progress Patient did not attend group despite encouraged participation.      Therapeutic Modalities:   Cognitive Behavioral Therapy Solution Focused Therapy Motivational Interviewing Brief Therapy

## 2021-12-16 NOTE — Progress Notes (Signed)
   12/16/21 2200  Psych Admission Type (Psych Patients Only)  Admission Status Involuntary  Psychosocial Assessment  Patient Complaints Substance abuse  Eye Contact Fair  Facial Expression Worried  Affect Anxious  Speech Slow  Interaction Assertive  Motor Activity Slow  Appearance/Hygiene In scrubs  Behavior Characteristics Cooperative;Anxious  Mood Depressed  Aggressive Behavior  Effect No apparent injury  Thought Process  Coherency Circumstantial  Content Blaming others  Delusions WDL  Perception Hallucinations  Hallucination Auditory;Visual  Judgment Impaired  Confusion None  Danger to Self  Current suicidal ideation? Passive  Self-Injurious Behavior Some self-injurious ideation observed or expressed.  No lethal plan expressed   Agreement Not to Harm Self Yes  Description of Agreement verbal contract for safety  Danger to Others  Danger to Others None reported or observed
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
## 2021-12-16 NOTE — Progress Notes (Signed)
   12/16/21 0500  Sleep  Number of Hours 6.5

## 2021-12-17 LAB — HEPATIC FUNCTION PANEL
ALT: 42 U/L (ref 0–44)
AST: 42 U/L — ABNORMAL HIGH (ref 15–41)
Albumin: 3.5 g/dL (ref 3.5–5.0)
Alkaline Phosphatase: 48 U/L (ref 38–126)
Bilirubin, Direct: <0.1 mg/dL (ref 0.0–0.2)
Total Bilirubin: 0.3 mg/dL (ref 0.3–1.2)
Total Protein: 6.5 g/dL (ref 6.5–8.1)

## 2021-12-17 MED ORDER — ARIPIPRAZOLE 15 MG PO TABS
7.5000 mg | ORAL_TABLET | Freq: Every day | ORAL | Status: AC
  Administered 2021-12-17: 7.5 mg via ORAL
  Filled 2021-12-17: qty 1

## 2021-12-17 MED ORDER — ARIPIPRAZOLE 10 MG PO TABS
10.0000 mg | ORAL_TABLET | Freq: Every day | ORAL | Status: DC
  Administered 2021-12-18: 10 mg via ORAL
  Filled 2021-12-17 (×2): qty 1

## 2021-12-17 MED ORDER — AMLODIPINE BESYLATE 5 MG PO TABS
5.0000 mg | ORAL_TABLET | Freq: Every day | ORAL | Status: DC
  Administered 2021-12-17 – 2021-12-19 (×3): 5 mg via ORAL
  Filled 2021-12-17 (×5): qty 1

## 2021-12-17 NOTE — Plan of Care (Signed)
Nurse discussed anxiety, depression and coping skills with patient.  

## 2021-12-17 NOTE — Progress Notes (Signed)
   12/17/21 0500  Sleep  Number of Hours 6.75

## 2021-12-17 NOTE — Progress Notes (Signed)
Pt did not attend group. 

## 2021-12-17 NOTE — Progress Notes (Addendum)
D:  Patient denied SI and HI, contracts for safety.  Denied A/V hallucinations.  Denied pain. A:  Medications administered per MD orders.  Emotional support and encouragement given patient. R:  Safety maintained with 15 minute checks.  
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
## 2021-12-18 NOTE — Progress Notes (Signed)
   12/18/21 0000  Psych Admission Type (Psych Patients Only)  Admission Status Involuntary  Psychosocial Assessment  Patient Complaints Anxiety  Eye Contact Fair  Facial Expression Worried  Affect Anxious  Speech Slow  Interaction Assertive  Motor Activity Slow  Appearance/Hygiene In scrubs  Behavior Characteristics Anxious  Mood Depressed  Aggressive Behavior  Effect No apparent injury  Thought Process  Coherency Circumstantial  Content Blaming others  Delusions WDL  Perception Hallucinations  Hallucination Auditory;Visual  Judgment Impaired  Confusion None  Danger to Self  Current suicidal ideation? Passive  Self-Injurious Behavior Some self-injurious ideation observed or expressed.  No lethal plan expressed   Agreement Not to Harm Self Yes  Description of Agreement verbal contract for safety  Danger to Others  Danger to Others None reported or observed

## 2021-12-18 NOTE — Group Note (Signed)
[facility] LCSW Group Therapy Note   Group Date: 12/18/2021 Start Time: 1300 End Time: 1400  Type of Therapy and Topic:  Group Therapy:  Feelings around Relapse and Recovery  Participation Level:  Did Not Attend   Mood:  Description of Group:    Patients in this group will discuss emotions they experience before and after a relapse. They will process how experiencing these feelings, or avoidance of experiencing them, relates to having a relapse. Facilitator will guide patients to explore emotions they have related to recovery. Patients will be encouraged to process which emotions are more powerful. They will be guided to discuss the emotional reaction significant others in their lives may have to patients' relapse or recovery. Patients will be assisted in exploring ways to respond to the emotions of others without this contributing to a relapse.  Therapeutic Goals: Patient will identify two or more emotions that lead to relapse for them:  Patient will identify two emotions that result when they relapse:  Patient will identify two emotions related to recovery:  Patient will demonstrate ability to communicate their needs through discussion and/or role plays.   Summary of Patient Progress:  Patient did not attend group despite encouraged participation.    Therapeutic Modalities:   Cognitive Behavioral Therapy Solution-Focused Therapy Assertiveness Training Relapse Prevention Therapy   Durenda Hurt, [place]

## 2021-12-18 NOTE — Group Note (Signed)
Recreation Therapy Group Note   Group Topic:Stress Management  Group Date: 12/18/2021 Start Time: 0935 End Time: 0955 Facilitators: Victorino Sparrow, [place] Location: [address] Dayroom   Goal Area(s) Addresses:  Patient will identify positive stress management techniques. Patient will identify benefits of using stress management post d/c.  Group Description:  Meditation.  LRT played a meditation for patients that focused on finding inner peace for different areas of their lives.  Patients were to listen and focus on the meditation to fully engage in the activity.  Patients were made aware of other places to find stress management techniques such as Youtube, Apps, etc.    Affect/Mood: N/A   Participation Level: Did not attend    Clinical Observations/Individualized Feedback:      Plan: Continue to engage patient in RT group sessions 2-3x/week.   Victorino Sparrow, LRT,CTRS  12/18/2021 11:06 AM

## 2021-12-18 NOTE — Progress Notes (Signed)
   12/18/21 0900  Psych Admission Type (Psych Patients Only)  Admission Status Involuntary  Psychosocial Assessment  Patient Complaints Anxiety  Eye Contact Fair  Facial Expression Worried  Affect Anxious  Speech Slow  Interaction Assertive  Motor Activity Slow  Appearance/Hygiene Improved  Behavior Characteristics Anxious  Mood Depressed  Aggressive Behavior  Effect No apparent injury  Thought Process  Coherency Circumstantial  Content WDL  Delusions None reported or observed  Perception WDL  Hallucination None reported or observed  Judgment Impaired  Confusion None  Danger to Self  Current suicidal ideation? Denies  Self-Injurious Behavior No self-injurious ideation or behavior indicators observed or expressed   Agreement Not to Harm Self Yes  Description of Agreement Verbal  Danger to Others  Danger to Others None reported or observed
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
## 2021-12-18 NOTE — Progress Notes (Signed)
Adult Psychoeducational Group Note  Date:  12/18/2021 Time:  10:09 PM  Group Topic/Focus:  Wrap-Up Group:   The focus of this group is to help patients review their daily goal of treatment and discuss progress on daily workbooks.  Participation Level:  Active  Participation Quality:  Appropriate, Attentive, and Sharing  Affect:  Depressed  Cognitive:  Alert and Appropriate  Insight: Appropriate  Engagement in Group:  Engaged  Modes of Intervention:  Discussion and Support  Additional Comments:  Pt shared he is leaving tomorrow. Pt shared he slept all day because of his medication. Pt shared he is happy he got up to come to wrap up group. Pt happy that he got cookies for snack.   [name] 12/18/2021, 10:09 PM

## 2021-12-18 NOTE — Progress Notes (Signed)
   12/18/21 2100  Psych Admission Type (Psych Patients Only)  Admission Status Involuntary  Psychosocial Assessment  Patient Complaints Anxiety  Eye Contact Fair  Facial Expression Worried  Affect Anxious  Speech Slow  Interaction Assertive  Motor Activity Slow  Appearance/Hygiene In scrubs  Behavior Characteristics Anxious  Mood Depressed  Aggressive Behavior  Effect No apparent injury  Thought Process  Coherency Circumstantial  Content Blaming others  Delusions WDL  Perception Hallucinations  Hallucination Auditory;Visual  Judgment Impaired  Confusion None  Danger to Self  Current suicidal ideation? Passive  Self-Injurious Behavior Some self-injurious ideation observed or expressed.  No lethal plan expressed   Agreement Not to Harm Self Yes  Description of Agreement verbal contract for safety  Danger to Others  Danger to Others None reported or observed

## 2021-12-19 MED ORDER — LAMOTRIGINE 25 MG PO TABS: 25.0000 mg | ORAL_TABLET | Freq: Every day | ORAL | 0 refills | Status: DC

## 2021-12-19 MED ORDER — TRAZODONE HCL 50 MG PO TABS: 50.0000 mg | ORAL_TABLET | Freq: Every evening | ORAL | 0 refills | Status: DC | PRN

## 2021-12-19 MED ORDER — SERTRALINE HCL 50 MG PO TABS: 50.0000 mg | ORAL_TABLET | Freq: Every day | ORAL | 0 refills | Status: DC

## 2021-12-19 MED ORDER — ROSUVASTATIN CALCIUM 40 MG PO TABS: 40.0000 mg | ORAL_TABLET | Freq: Every day | ORAL | 0 refills | Status: DC

## 2021-12-19 MED ORDER — ARIPIPRAZOLE 10 MG PO TABS
10.0000 mg | ORAL_TABLET | Freq: Every day | ORAL | 0 refills | Status: DC
Start: 2021-12-19 — End: 2022-02-11

## 2021-12-19 MED ORDER — GABAPENTIN 300 MG PO CAPS: 300.0000 mg | ORAL_CAPSULE | Freq: Three times a day (TID) | ORAL | 0 refills | Status: DC

## 2021-12-19 MED ORDER — BUSPIRONE HCL 10 MG PO TABS: 10.0000 mg | ORAL_TABLET | Freq: Three times a day (TID) | ORAL | 0 refills | Status: DC

## 2021-12-19 MED ORDER — NICOTINE 7 MG/24HR TD PT24: 7.0000 mg | MEDICATED_PATCH | Freq: Every day | TRANSDERMAL | 0 refills | Status: DC | PRN

## 2021-12-19 MED ORDER — ADULT MULTIVITAMIN W/MINERALS CH: 1.0000 | ORAL_TABLET | Freq: Every day | ORAL | Status: DC

## 2021-12-19 MED ORDER — AMLODIPINE BESYLATE 5 MG PO TABS: 5.0000 mg | ORAL_TABLET | Freq: Every day | ORAL | 0 refills | Status: DC

## 2021-12-19 MED ORDER — PROPRANOLOL HCL 10 MG PO TABS
10.0000 mg | ORAL_TABLET | Freq: Three times a day (TID) | ORAL | 0 refills | Status: DC
Start: 2021-12-19 — End: 2022-06-07

## 2021-12-19 NOTE — Progress Notes (Signed)
DISCHARGE NOTE: All possessions/property returned to patient and signature obtained. Verbal and written education provided to patient regarding follow up care, treatment plan and discharge medications. Pt verbalized comprehension. Pt currently denies suicidal ideations, denies homicidal ideations, denies visual hallucination, and denies auditory hallucinations. No c/o pain and/or discomfort. All precautions discontinued by physician. Pt remains calm, cooperative, medication-compliant, ambulatory and functioning at Pt's optimal level. There are no indications that this Pt is engaged in self-directed violent behavior, either preparatory or potentially harmful.

## 2021-12-19 NOTE — [facility] Suicide Risk Assessment (Cosign Needed)
Suicide Risk Assessment  Discharge Assessment    [name] Discharge Suicide Risk Assessment   Principal Problem: Bipolar I disorder, current or most recent episode depressed, with psychotic features [name])  Discharge Diagnoses: Principal Problem:   Bipolar I disorder, current or most recent episode depressed, with psychotic features ([place]) Active Problems:   HYPERCHOLESTEROLEMIA   Essential hypertension   PTSD (post-traumatic stress disorder)   Cocaine dependence syndrome ([place])   Polysubstance abuse ([place])   Insomnia   Alcohol use disorder   Anxiety  [name] course: Per admission evaluation: (41 y.o male with a past history of bipolar d/o, MDD, GAD, ETOH use d/o, PTSD & cocaine use d/o who presented to the [name] with complaints of intentional overdose on Alcohol and Trazodone. Pt was medically treated, deemed to by at high risk of danger to self and transferred involuntarily to this [name] for treatment and stabilization of his mood. Pt states to Probation officer that he prefers to be referred to as "[name]", and prefers "he", and "him" pronouns).  This is one of several admissions/discharged summaries from this [name] for this 41 year old AA male. [name] has long hx of mental health issues & substance abuse problems including alcohol. He also has hx of multiple suicide attempts by overdose on medications. He was admitted to the [name] this time around for evaluation & treatment after an intentional overdose attempt on Trazodone.   After evaluation of his presenting symptoms on the day of his admission, he was recommended for mood stabilization treatments by his treatment. And with his consent, he was treated, stabilized & discharged on the medications as listed on his discharge medication lists. [name] did take his medication & attended/participated in the group counseling sessions being offered in this [name]. He learned coping skills. [name]'s symptoms responded well to his treatment regimen warranting  this discharge. He is currently mentally & medically stable to continue mental health care on an outpatient basis as noted below.  Today upon his discharge evaluation with his treatment team, [name] shares he is doing well. He denies any other specific concerns. He is sleeping well. His appetite is good. He denies other physical complaints. He denies SI/HI/AH/VH, delusional thoughts or paranoia. He does not appear to be responding to any internal stimuli. He feels that his medications have been helpful and he is in agreement to continue his current treatment regimen as recommended. He was able to engage in safety planning including plan to return to [facility] or contact emergency services if he feels unable to maintain his own safety or the safety of others. Pt had no further questions, comments, or concerns. He left [name] with all personal belongings in no apparent distress. Transportation per his family (mother).  Total Time spent with patient:  Greater than 30 minutes  Musculoskeletal: Strength & Muscle Tone: within normal limits Gait & Station: normal Patient leans: N/A  Psychiatric Specialty Exam  Presentation  General Appearance: Appropriate for Environment; Casual; Fairly Groomed  Eye Contact:Good  Speech:Clear and Coherent; Normal Rate  Speech Volume:Normal  Handedness:Right  Mood and Affect  Mood:Euthymic  Duration of Depression Symptoms: Greater than two weeks  Affect:Appropriate; Congruent   Thought Process  Thought Processes:Coherent; Goal Directed  Descriptions of Associations:Intact  Orientation:Full (Time, Place and Person)  Thought Content:-- (some illogical content (thinks people can take thoughts out of his brain and put thoughts in his brain))  History of Schizophrenia/Schizoaffective disorder:No  Duration of Psychotic Symptoms:N/A  Hallucinations:Hallucinations: None Description of Auditory Hallucinations: NA Description  of Visual Hallucinations: NA  Ideas  of Reference:None  Suicidal Thoughts:Suicidal Thoughts: No SI Active Intent and/or Plan: Without Intent; Without Means to Carry Out; Without Access to Means; Without Plan SI Passive Intent and/or Plan: Without Intent; Without Plan; Without Means to Carry Out; Without Access to Means  Homicidal Thoughts:Homicidal Thoughts: No  Sensorium  Memory:Immediate Good; Recent Good; Remote Good  Judgment:Good  Insight:Good  Executive Functions  Concentration:Good  Attention Span:Good  [name] of Knowledge:Good  Language:Good  Psychomotor Activity  Psychomotor Activity:Psychomotor Activity: Normal  Assets  Assets:Communication Skills; Desire for Improvement; Financial Resources/Insurance; Housing; Social Support; Resilience; Physical Health; Vocational/Educational  Sleep  Sleep:Sleep: Good Number of Hours of Sleep: 7.5  Physical Exam: Physical Exam Vitals and nursing note reviewed.  HENT:     Mouth/Throat:     Pharynx: Oropharynx is clear.  Cardiovascular:     Pulses: Normal pulses.  Pulmonary:     Effort: Pulmonary effort is normal.  Genitourinary:    Comments: Deferred Musculoskeletal:        General: Normal range of motion.     Cervical back: Normal range of motion.  Skin:    General: Skin is warm and dry.  Neurological:     General: No focal deficit present.     Mental Status: He is alert and oriented to person, place, and time. Mental status is at baseline.   Review of Systems  Constitutional:  Negative for chills, diaphoresis and fever.  HENT:  Negative for congestion and sore throat.   Respiratory:  Negative for cough, shortness of breath and wheezing.   Cardiovascular:  Negative for chest pain and palpitations.  Gastrointestinal:  Negative for abdominal pain, constipation, diarrhea, heartburn, nausea and vomiting.  Neurological:  Negative for dizziness, tingling, tremors, sensory change, speech change, focal weakness, seizures, loss of  consciousness, weakness and headaches.  Endo/Heme/Allergies:        See allergy lists.  Psychiatric/Behavioral:  Negative for depression (Hx of, chronic), hallucinations, memory loss, substance abuse (Hx of, including alcohol/other drugs, chronic.) and suicidal ideas. The patient is not nervous/anxious (Stable upon discharge.) and does not have insomnia.   Blood pressure (!) 116/93, pulse 84, temperature 97.8 F (36.6 C), temperature source Oral, resp. rate 16, height '5\' 6"'$  (1.676 m), weight 70.9 kg, SpO2 100 %. Body mass index is 25.21 kg/m.  Mental Status Per Nursing Assessment::   On Admission:  Suicidal ideation indicated by patient  Demographic Factors:  Male, Adolescent or young adult, Gay, lesbian, or bisexual orientation, and Low socioeconomic status  Loss Factors: NA  Historical Factors: Prior suicide attempts and Impulsivity  Risk Reduction Factors:   Positive social support, Positive therapeutic relationship, and Positive coping skills or problem solving skills  Continued Clinical Symptoms:  Depression:   Impulsivity Alcohol/Substance Abuse/Dependencies More than one psychiatric diagnosis Previous Psychiatric Diagnoses and Treatments  Cognitive Features That Contribute To Risk:  Closed-mindedness, Polarized thinking, and Thought constriction (tunnel vision)    Suicide Risk:  Minimal: No identifiable suicidal ideation.  Patients presenting with no risk factors but with morbid ruminations; may be classified as minimal risk based on the severity of the depressive symptoms   Follow-up [facility]. Go on 12/21/2021.   Specialty: Behavioral Health Why: Please go to this provider for outpatient therapy and medication management services on Monday or Wednesday at 7:30 am. Contact information: [address],  [place]. Go on 12/22/2021.   Specialty: Behavioral  Health Why: You have an assessment appointment for substance abuse intensive outpatient therapy services on 12/22/21 at 2:00 pm.  This appointment will be held in person and will last approximately one hour.  Please bring your insurance card with you. Contact information: [address] [place] [address] [phone]                Plan Of Care/Follow-up recommendations: Activity:  As tolerated Diet: As recommended by your primary care doctor. Keep all scheduled follow-up appointments as recommended.   Lindell Spar, NP, pmhnp, fnp-bc 12/19/2021, 10:55 AM

## 2021-12-19 NOTE — Progress Notes (Signed)
   12/19/21 0500  Sleep  Number of Hours 7.5

## 2021-12-19 NOTE — Progress Notes (Signed)
  [facility] Adult Case Management Discharge Plan :  Will you be returning to the same living situation after discharge:  Yes,  boarding room At discharge, do you have transportation home?: Yes,  mother to pick up before 12:00pm Do you have the ability to pay for your medications: Yes,  insurance  Release of information consent forms completed and emailed to Medical Records, then turned in to Medical Records by CSW.   Patient to Follow up at:  [place]. Go on 12/21/2021.   Specialty: Behavioral Health Why: Please go to this provider for outpatient therapy and medication management services on Monday or Wednesday at 7:30 am. Contact information: [address], [place]. Go on 12/22/2021.   Specialty: Behavioral Health Why: You have an assessment appointment for substance abuse intensive outpatient therapy services on 12/22/21 at 2:00 pm.  This appointment will be held in person and will last approximately one hour.  Please bring your insurance card with you. Contact information: [place] [address] [phone]                 Next level of care provider has access to [place] [facility] and Suicide Prevention discussed: Yes,  mother     Has patient been referred to the Quitline?: Patient refused referral  Patient has been referred for addiction treatment: Pt. refused referral  Maretta Los, LCSW 12/19/2021, 9:43 AM
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
## 2022-02-03 ENCOUNTER — Encounter: Payer: 59 | Admitting: Student

## 2022-02-11 ENCOUNTER — Ambulatory Visit (INDEPENDENT_AMBULATORY_CARE_PROVIDER_SITE_OTHER): Payer: Commercial Managed Care - HMO | Admitting: Student

## 2022-02-11 ENCOUNTER — Encounter: Payer: Self-pay | Admitting: Student

## 2022-02-11 ENCOUNTER — Encounter: Payer: 59 | Admitting: Student

## 2022-02-11 ENCOUNTER — Other Ambulatory Visit: Payer: Self-pay

## 2022-02-11 VITALS — BP 112/99 | HR 106 | Temp 98.2°F | Ht 66.0 in | Wt 162.3 lb

## 2022-02-11 DIAGNOSIS — M25512 Pain in left shoulder: Secondary | ICD-10-CM | POA: Diagnosis not present

## 2022-02-11 DIAGNOSIS — Z87891 Personal history of nicotine dependence: Secondary | ICD-10-CM

## 2022-02-11 DIAGNOSIS — G40909 Epilepsy, unspecified, not intractable, without status epilepticus: Secondary | ICD-10-CM | POA: Diagnosis not present

## 2022-02-11 DIAGNOSIS — E785 Hyperlipidemia, unspecified: Secondary | ICD-10-CM

## 2022-02-11 DIAGNOSIS — F3164 Bipolar disorder, current episode mixed, severe, with psychotic features: Secondary | ICD-10-CM | POA: Diagnosis not present

## 2022-02-11 DIAGNOSIS — G8929 Other chronic pain: Secondary | ICD-10-CM

## 2022-02-11 DIAGNOSIS — R42 Dizziness and giddiness: Secondary | ICD-10-CM

## 2022-02-11 DIAGNOSIS — F315 Bipolar disorder, current episode depressed, severe, with psychotic features: Secondary | ICD-10-CM

## 2022-02-11 DIAGNOSIS — I10 Essential (primary) hypertension: Secondary | ICD-10-CM

## 2022-02-11 DIAGNOSIS — F411 Generalized anxiety disorder: Secondary | ICD-10-CM

## 2022-02-11 MED ORDER — MECLIZINE HCL 25 MG PO TABS: 25.0000 mg | ORAL_TABLET | Freq: Three times a day (TID) | ORAL | 0 refills | Status: DC | PRN

## 2022-02-11 MED ORDER — BUSPIRONE HCL 10 MG PO TABS: 10.0000 mg | ORAL_TABLET | Freq: Three times a day (TID) | ORAL | 0 refills | Status: DC

## 2022-02-11 MED ORDER — ARIPIPRAZOLE 10 MG PO TABS: 10.0000 mg | ORAL_TABLET | Freq: Every day | ORAL | 0 refills | Status: DC

## 2022-02-11 MED ORDER — LAMOTRIGINE 25 MG PO TABS: 25.0000 mg | ORAL_TABLET | Freq: Every day | ORAL | 0 refills | Status: DC

## 2022-02-11 MED ORDER — GABAPENTIN 300 MG PO CAPS: 300.0000 mg | ORAL_CAPSULE | Freq: Three times a day (TID) | ORAL | 0 refills | Status: DC

## 2022-02-11 MED ORDER — SERTRALINE HCL 50 MG PO TABS: 50.0000 mg | ORAL_TABLET | Freq: Every day | ORAL | 0 refills | Status: DC

## 2022-02-11 MED ORDER — ROSUVASTATIN CALCIUM 40 MG PO TABS: 40.0000 mg | ORAL_TABLET | Freq: Every day | ORAL | 0 refills | Status: DC

## 2022-02-11 MED ORDER — AMLODIPINE BESYLATE 5 MG PO TABS
5.0000 mg | ORAL_TABLET | Freq: Every day | ORAL | 0 refills | Status: DC
Start: 2022-02-11 — End: 2022-11-29

## 2022-02-11 NOTE — Assessment & Plan Note (Addendum)
Vitals:   02/11/22 1533  BP: (!) 112/99  Pulse: (!) 106  Temp: 98.2 F (36.8 C)  SpO2: 99%   Patient reports taking Norvasc 5 mg. Tolerating it well.   Plan -continue Norvasc 5 mg daily

## 2022-02-11 NOTE — Assessment & Plan Note (Signed)
Patient reports running out of his Lamictal. Will refill his Lamictal 25 mg daily.

## 2022-02-11 NOTE — Patient Instructions (Addendum)
Thank you, [name] for allowing [place] to provide your care today. Today we discussed medication refills.  I have refilled your medications today. Please make sure to go to your [place] appointment next week.   I have ordered the following labs for you:  Lab Orders  No laboratory test(s) ordered today     Referrals ordered today:   Referral Orders         Ambulatory referral to Pain Clinic       I have ordered the following medication/changed the following medications:   Stop the following medications: Medications Discontinued During This Encounter  Medication Reason   amLODipine (NORVASC) 5 MG tablet Reorder   rosuvastatin (CRESTOR) 40 MG tablet Reorder   ARIPiprazole (ABILIFY) 10 MG tablet Reorder   busPIRone (BUSPAR) 10 MG tablet Reorder   sertraline (ZOLOFT) 50 MG tablet Reorder   gabapentin (NEURONTIN) 300 MG capsule Reorder   lamoTRIgine (LAMICTAL) 25 MG tablet Reorder     Start the following medications: Meds ordered this encounter  Medications   rosuvastatin (CRESTOR) 40 MG tablet    Sig: Take 1 tablet (40 mg total) by mouth daily.    Dispense:  30 tablet    Refill:  0   lamoTRIgine (LAMICTAL) 25 MG tablet    Sig: Take 1 tablet (25 mg total) by mouth daily.    Dispense:  30 tablet    Refill:  0   busPIRone (BUSPAR) 10 MG tablet    Sig: Take 1 tablet (10 mg total) by mouth 3 (three) times daily.    Dispense:  90 tablet    Refill:  0   gabapentin (NEURONTIN) 300 MG capsule    Sig: Take 1 capsule (300 mg total) by mouth 3 (three) times daily.    Dispense:  90 capsule    Refill:  0   sertraline (ZOLOFT) 50 MG tablet    Sig: Take 1 tablet (50 mg total) by mouth daily.    Dispense:  30 tablet    Refill:  0   ARIPiprazole (ABILIFY) 10 MG tablet    Sig: Take 1 tablet (10 mg total) by mouth at bedtime.    Dispense:  30 tablet    Refill:  0   amLODipine (NORVASC) 5 MG tablet    Sig: Take 1 tablet (5 mg total) by mouth daily.    Dispense:  30 tablet     Refill:  0   meclizine (ANTIVERT) 25 MG tablet    Sig: Take 1 tablet (25 mg total) by mouth 3 (three) times daily as needed for dizziness.    Dispense:  30 tablet    Refill:  0     Follow up:  2 weeks     Should you have any questions or concerns please call the internal medicine clinic at [phone].    Angelique Blonder, D.O. [place]
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
## 2022-02-12 ENCOUNTER — Encounter: Payer: Self-pay | Admitting: Student

## 2022-02-12 NOTE — Assessment & Plan Note (Signed)
Patient is on Crestor 40 mg daily. Refilled Crestor today.

## 2022-02-12 NOTE — Assessment & Plan Note (Deleted)
Refilled his Crestor 40 mg daily.

## 2022-02-12 NOTE — Assessment & Plan Note (Addendum)
Patient presents to clinic for medication refills. Patient reports being in and out of rehabs. He has an appointment next week at the [facility] in [place] for mental health care. He reports running low or out of some of his medications. Will refill his Abilify 10 mg, Buspar 10 mg, Zoloft 50 mg and Gabapentin 300 mg.   Discussed with patient the importance of attending his appointment at the [facility] next week and establishing care there.
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
## 2022-02-15 NOTE — Progress Notes (Signed)
Internal Medicine Clinic Attending  I saw and evaluated the patient.  I personally confirmed the key portions of the history and exam documented by Dr. Zheng and I reviewed pertinent patient test results.  The assessment, diagnosis, and plan were formulated together and I agree with the documentation in the resident's note.  

## 2022-02-25 ENCOUNTER — Encounter: Payer: Commercial Managed Care - HMO | Admitting: Internal Medicine

## 2022-03-11 ENCOUNTER — Encounter (HOSPITAL_COMMUNITY): Payer: Self-pay | Admitting: Emergency Medicine

## 2022-03-11 ENCOUNTER — Ambulatory Visit (HOSPITAL_COMMUNITY)
Admission: EM | Admit: 2022-03-11 | Discharge: 2022-03-11 | Disposition: A | Discharge status: Home / Self Care | Payer: Commercial Managed Care - HMO

## 2022-03-11 DIAGNOSIS — M79605 Pain in left leg: Secondary | ICD-10-CM

## 2022-03-11 NOTE — Discharge Instructions (Signed)
Please go to the nearest emergency department for further evaluation due to your history of peripheral arterial disease and possible blood clot to your left calf.

## 2022-03-11 NOTE — ED Notes (Signed)
Patient is being discharged from the Urgent Care and sent to the Emergency Department via Personal Vehicle . Per Sharyn Lull FNP, patient is in need of higher level of care due to Left lower leg pain and possible DVT. Patient is aware and verbalizes understanding of plan of care.  Vitals:   03/11/22 1944  BP: (!) 151/118  Pulse: (!) 111  Resp: 20  Temp: 98.3 F (36.8 C)  SpO2: 97%

## 2022-03-11 NOTE — ED Triage Notes (Signed)
Pt reports left leg pain. States he has a stent placed in his popliteal artery and has noticed increased pain and throbbing.   Requesting pain medication. States normally takes ibuprofen, however ibuprofen has not been effective.
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
## 2022-05-10 ENCOUNTER — Emergency Department (HOSPITAL_COMMUNITY)
Admission: EM | Admit: 2022-05-10 | Discharge: 2022-05-12 | Disposition: A | Discharge status: Other Acute Inpatient Hospital | Payer: Commercial Managed Care - HMO | Attending: Emergency Medicine | Admitting: Emergency Medicine

## 2022-05-10 ENCOUNTER — Other Ambulatory Visit: Payer: Self-pay

## 2022-05-10 ENCOUNTER — Encounter (HOSPITAL_COMMUNITY): Payer: Self-pay | Admitting: Emergency Medicine

## 2022-05-10 DIAGNOSIS — I10 Essential (primary) hypertension: Secondary | ICD-10-CM | POA: Diagnosis not present

## 2022-05-10 DIAGNOSIS — Z79899 Other long term (current) drug therapy: Secondary | ICD-10-CM | POA: Diagnosis not present

## 2022-05-10 DIAGNOSIS — F1090 Alcohol use, unspecified, uncomplicated: Secondary | ICD-10-CM

## 2022-05-10 DIAGNOSIS — R7401 Elevation of levels of liver transaminase levels: Secondary | ICD-10-CM | POA: Diagnosis not present

## 2022-05-10 DIAGNOSIS — Z20822 Contact with and (suspected) exposure to covid-19: Secondary | ICD-10-CM | POA: Insufficient documentation

## 2022-05-10 DIAGNOSIS — F332 Major depressive disorder, recurrent severe without psychotic features: Secondary | ICD-10-CM | POA: Insufficient documentation

## 2022-05-10 DIAGNOSIS — Z853 Personal history of malignant neoplasm of breast: Secondary | ICD-10-CM | POA: Insufficient documentation

## 2022-05-10 DIAGNOSIS — F10920 Alcohol use, unspecified with intoxication, uncomplicated: Secondary | ICD-10-CM

## 2022-05-10 DIAGNOSIS — D696 Thrombocytopenia, unspecified: Secondary | ICD-10-CM

## 2022-05-10 DIAGNOSIS — Z8616 Personal history of COVID-19: Secondary | ICD-10-CM | POA: Diagnosis not present

## 2022-05-10 DIAGNOSIS — T50902A Poisoning by unspecified drugs, medicaments and biological substances, intentional self-harm, initial encounter: Secondary | ICD-10-CM

## 2022-05-10 DIAGNOSIS — F17210 Nicotine dependence, cigarettes, uncomplicated: Secondary | ICD-10-CM | POA: Insufficient documentation

## 2022-05-10 DIAGNOSIS — I2510 Atherosclerotic heart disease of native coronary artery without angina pectoris: Secondary | ICD-10-CM | POA: Diagnosis not present

## 2022-05-10 DIAGNOSIS — R000 Tachycardia, unspecified: Secondary | ICD-10-CM | POA: Insufficient documentation

## 2022-05-10 DIAGNOSIS — F1020 Alcohol dependence, uncomplicated: Secondary | ICD-10-CM | POA: Diagnosis not present

## 2022-05-10 DIAGNOSIS — Z85528 Personal history of other malignant neoplasm of kidney: Secondary | ICD-10-CM | POA: Diagnosis not present

## 2022-05-10 DIAGNOSIS — E876 Hypokalemia: Secondary | ICD-10-CM | POA: Diagnosis not present

## 2022-05-10 DIAGNOSIS — Y908 Blood alcohol level of 240 mg/100 ml or more: Secondary | ICD-10-CM | POA: Insufficient documentation

## 2022-05-10 DIAGNOSIS — R030 Elevated blood-pressure reading, without diagnosis of hypertension: Secondary | ICD-10-CM

## 2022-05-10 DIAGNOSIS — F1410 Cocaine abuse, uncomplicated: Secondary | ICD-10-CM

## 2022-05-10 LAB — ETHANOL: Alcohol, Ethyl (B): 373 mg/dL (ref <10)

## 2022-05-10 LAB — CBC WITH DIFFERENTIAL/PLATELET
Abs Immature Granulocytes: 0.01 K/uL (ref 0.00–0.07)
Basophils Absolute: 0 K/uL (ref 0.0–0.1)
Basophils Relative: 1 %
Eosinophils Absolute: 0 K/uL (ref 0.0–0.5)
Eosinophils Relative: 0 %
HCT: 47.8 % (ref 39.0–52.0)
Hemoglobin: 16.4 g/dL (ref 13.0–17.0)
Immature Granulocytes: 0 %
Lymphocytes Relative: 42 %
Lymphs Abs: 1.8 K/uL (ref 0.7–4.0)
MCH: 31.1 pg (ref 26.0–34.0)
MCHC: 34.3 g/dL (ref 30.0–36.0)
MCV: 90.5 fL (ref 80.0–100.0)
Monocytes Absolute: 0.5 K/uL (ref 0.1–1.0)
Monocytes Relative: 12 %
Neutro Abs: 1.9 K/uL (ref 1.7–7.7)
Neutrophils Relative %: 45 %
Platelets: 102 K/uL — ABNORMAL LOW (ref 150–400)
RBC: 5.28 MIL/uL (ref 4.22–5.81)
RDW: 13.9 % (ref 11.5–15.5)
WBC: 4.3 K/uL (ref 4.0–10.5)
nRBC: 0 % (ref 0.0–0.2)

## 2022-05-10 LAB — RESP PANEL BY RT-PCR (FLU A&B, COVID) ARPGX2
Influenza A by PCR: NEGATIVE
Influenza B by PCR: NEGATIVE
SARS Coronavirus 2 by RT PCR: NEGATIVE

## 2022-05-10 LAB — COMPREHENSIVE METABOLIC PANEL
ALT: 116 U/L — ABNORMAL HIGH (ref 0–44)
AST: 157 U/L — ABNORMAL HIGH (ref 15–41)
Albumin: 4 g/dL (ref 3.5–5.0)
Alkaline Phosphatase: 38 U/L (ref 38–126)
Anion gap: 13 (ref 5–15)
BUN: 6 mg/dL (ref 6–20)
CO2: 25 mmol/L (ref 22–32)
Calcium: 8.5 mg/dL — ABNORMAL LOW (ref 8.9–10.3)
Chloride: 100 mmol/L (ref 98–111)
Creatinine, Ser: 0.83 mg/dL (ref 0.61–1.24)
GFR, Estimated: >60 mL/min (ref >60)
Glucose, Bld: 100 mg/dL — ABNORMAL HIGH (ref 70–99)
Potassium: 3.1 mmol/L — ABNORMAL LOW (ref 3.5–5.1)
Sodium: 138 mmol/L (ref 135–145)
Total Bilirubin: 0.8 mg/dL (ref 0.3–1.2)
Total Protein: 7.5 g/dL (ref 6.5–8.1)

## 2022-05-10 LAB — RAPID HIV SCREEN (HIV 1/2 AB+AG)
HIV 1/2 Antibodies: NONREACTIVE
HIV-1 P24 Antigen - HIV24: NONREACTIVE

## 2022-05-10 LAB — MAGNESIUM: Magnesium: 1.8 mg/dL (ref 1.7–2.4)

## 2022-05-10 MED ORDER — LORAZEPAM 2 MG/ML IJ SOLN
0.0000 mg | Freq: Four times a day (QID) | INTRAMUSCULAR | Status: DC
  Administered 2022-05-10: 1 mg via INTRAVENOUS
  Filled 2022-05-10: qty 1

## 2022-05-10 MED ORDER — THIAMINE MONONITRATE 100 MG PO TABS
100.0000 mg | ORAL_TABLET | Freq: Every day | ORAL | Status: DC
  Administered 2022-05-11: 100 mg via ORAL
  Filled 2022-05-10: qty 1

## 2022-05-10 MED ORDER — LORAZEPAM 2 MG/ML IJ SOLN: 0.5000 mg | Freq: Once | INTRAMUSCULAR | Status: DC

## 2022-05-10 MED ORDER — THIAMINE HCL 100 MG/ML IJ SOLN
100.0000 mg | Freq: Every day | INTRAMUSCULAR | Status: DC
  Administered 2022-05-10: 100 mg via INTRAVENOUS
  Filled 2022-05-10: qty 2

## 2022-05-10 MED ORDER — BUSPIRONE HCL 10 MG PO TABS
10.0000 mg | ORAL_TABLET | Freq: Three times a day (TID) | ORAL | Status: DC
  Administered 2022-05-11 (×4): 10 mg via ORAL
  Filled 2022-05-10 (×4): qty 1

## 2022-05-10 MED ORDER — IBUPROFEN 200 MG PO TABS
600.0000 mg | ORAL_TABLET | Freq: Three times a day (TID) | ORAL | Status: DC | PRN
  Administered 2022-05-11 – 2022-05-12 (×2): 600 mg via ORAL
  Filled 2022-05-10 (×3): qty 3

## 2022-05-10 MED ORDER — TRAZODONE HCL 100 MG PO TABS: 50.0000 mg | ORAL_TABLET | Freq: Every evening | ORAL | Status: DC | PRN

## 2022-05-10 MED ORDER — LORAZEPAM 1 MG PO TABS
0.0000 mg | ORAL_TABLET | Freq: Four times a day (QID) | ORAL | Status: DC
  Administered 2022-05-11 (×2): 1 mg via ORAL
  Administered 2022-05-11: 2 mg via ORAL
  Administered 2022-05-11: 1 mg via ORAL
  Filled 2022-05-10: qty 2
  Filled 2022-05-10 (×3): qty 1

## 2022-05-10 MED ORDER — POTASSIUM CHLORIDE CRYS ER 20 MEQ PO TBCR
40.0000 meq | EXTENDED_RELEASE_TABLET | Freq: Once | ORAL | Status: DC
  Filled 2022-05-10: qty 2

## 2022-05-10 MED ORDER — LORAZEPAM 1 MG PO TABS: 0.0000 mg | ORAL_TABLET | Freq: Two times a day (BID) | ORAL | Status: DC

## 2022-05-10 MED ORDER — FAMOTIDINE 20 MG PO TABS
20.0000 mg | ORAL_TABLET | Freq: Two times a day (BID) | ORAL | Status: DC
  Administered 2022-05-11 (×2): 20 mg via ORAL
  Filled 2022-05-10 (×2): qty 1

## 2022-05-10 MED ORDER — LORAZEPAM 2 MG/ML IJ SOLN: 0.0000 mg | Freq: Two times a day (BID) | INTRAMUSCULAR | Status: DC

## 2022-05-10 MED ORDER — ONDANSETRON HCL 4 MG PO TABS
4.0000 mg | ORAL_TABLET | Freq: Three times a day (TID) | ORAL | Status: DC | PRN
  Administered 2022-05-11: 4 mg via ORAL
  Filled 2022-05-10: qty 1

## 2022-05-10 MED ORDER — GABAPENTIN 300 MG PO CAPS
300.0000 mg | ORAL_CAPSULE | Freq: Three times a day (TID) | ORAL | Status: DC
  Administered 2022-05-11 (×3): 300 mg via ORAL
  Filled 2022-05-10 (×3): qty 1

## 2022-05-10 MED ORDER — NICOTINE 7 MG/24HR TD PT24: 7.0000 mg | MEDICATED_PATCH | Freq: Every day | TRANSDERMAL | Status: DC | PRN

## 2022-05-10 MED ORDER — ARIPIPRAZOLE 10 MG PO TABS
10.0000 mg | ORAL_TABLET | Freq: Every day | ORAL | Status: DC
  Administered 2022-05-11: 10 mg via ORAL
  Filled 2022-05-10: qty 1

## 2022-05-10 MED ORDER — AMLODIPINE BESYLATE 5 MG PO TABS
5.0000 mg | ORAL_TABLET | Freq: Every day | ORAL | Status: DC
  Administered 2022-05-11: 5 mg via ORAL
  Filled 2022-05-10 (×2): qty 1

## 2022-05-10 MED ORDER — ROSUVASTATIN CALCIUM 20 MG PO TABS
40.0000 mg | ORAL_TABLET | Freq: Every day | ORAL | Status: DC
  Administered 2022-05-11: 40 mg via ORAL
  Filled 2022-05-10: qty 2

## 2022-05-10 NOTE — BH Assessment (Signed)
BAL 373. TTS will attempt once patient is sober.
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
## 2022-05-10 NOTE — ED Provider Notes (Signed)
[facility] DEPT Provider Note   CSN: [id number] Arrival date & time: 05/10/22  2030     History  Chief Complaint  Patient presents with   Drug Overdose    [name] is a 41 y.o. male presenting to emergency department with concern for potential overdose.  Patient called 9 1 reported to EMS that he had taken 13 trazodone tablets and drank "a bottle of vodka".  He had reported that he did this with intention of killing himself.  However, upon arrival in the ED, in private conversation with me, the patient says "I am going to be honest doctor, I am not suicidal.  I just told him that so they would bring me here and I can get a bed for detox.  I really need detox from alcohol."  When I clarified that the patient was not actively trying to harm himself and had not ingested these medications, he states that this is the fact.  He is also requesting a COVID test at an HIV test.  He does not give permission to contact family members or his partner  HPI     Home Medications Prior to Admission medications   Medication Sig Start Date End Date Taking? Authorizing Provider  amLODipine (NORVASC) 5 MG tablet Take 1 tablet (5 mg total) by mouth daily. 02/11/22   Angelique Blonder, DO  ARIPiprazole (ABILIFY) 10 MG tablet Take 1 tablet (10 mg total) by mouth at bedtime. 02/11/22   Angelique Blonder, DO  busPIRone (BUSPAR) 10 MG tablet Take 1 tablet (10 mg total) by mouth 3 (three) times daily. 02/11/22   Angelique Blonder, DO  gabapentin (NEURONTIN) 300 MG capsule Take 1 capsule (300 mg total) by mouth 3 (three) times daily. 02/11/22   Angelique Blonder, DO  lamoTRIgine (LAMICTAL) 25 MG tablet Take 1 tablet (25 mg total) by mouth daily. 02/11/22 03/13/22  Angelique Blonder, DO  meclizine (ANTIVERT) 25 MG tablet Take 1 tablet (25 mg total) by mouth 3 (three) times daily as needed for dizziness. 02/11/22   Angelique Blonder, DO  Multiple Vitamin (MULTIVITAMIN WITH MINERALS) TABS tablet Take 1  tablet by mouth daily. 12/19/21   Dian Situ, MD  nicotine (NICODERM CQ - DOSED IN MG/24 HR) 7 mg/24hr patch Place 1 patch (7 mg total) onto the skin daily as needed (tobacco withdrawal). 12/19/21   Dian Situ, MD  propranolol (INDERAL) 10 MG tablet Take 1 tablet (10 mg total) by mouth 3 (three) times daily. 12/19/21   Dian Situ, MD  rosuvastatin (CRESTOR) 40 MG tablet Take 1 tablet (40 mg total) by mouth daily. 02/11/22   Angelique Blonder, DO  sertraline (ZOLOFT) 50 MG tablet Take 1 tablet (50 mg total) by mouth daily. 02/11/22 03/13/22  Angelique Blonder, DO  traZODone (DESYREL) 50 MG tablet Take 1 tablet (50 mg total) by mouth at bedtime as needed for sleep (Insomnia). 12/19/21   Dian Situ, MD      Allergies    Codeine, Penicillins, Morphine, Coconut (cocos nucifera), Coconut flavor, Grapefruit concentrate, Morphine and related, Oxycodone, and Norco [hydrocodone-acetaminophen]    Review of Systems   Review of Systems  Physical Exam Updated Vital Signs BP (!) 125/97   Pulse 87   Temp 98.2 F (36.8 C)   Resp 19   Ht '5\' 6"'$  (1.676 m)   Wt 73.9 kg   SpO2 94%   BMI 26.31 kg/m  Physical Exam Constitutional:      General: He is not in acute distress. HENT:  Head: Normocephalic and atraumatic.  Eyes:     Conjunctiva/sclera: Conjunctivae normal.     Pupils: Pupils are equal, round, and reactive to light.  Cardiovascular:     Rate and Rhythm: Regular rhythm. Tachycardia present.  Pulmonary:     Effort: Pulmonary effort is normal. No respiratory distress.  Abdominal:     General: There is no distension.     Tenderness: There is no abdominal tenderness.  Skin:    General: Skin is warm and dry.  Neurological:     General: No focal deficit present.     Mental Status: He is alert. Mental status is at baseline.  Psychiatric:     Comments: Tearful affect     ED Results / Procedures / Treatments   Labs (all labs ordered are listed, but only abnormal results are displayed) Labs  Reviewed  COMPREHENSIVE METABOLIC PANEL - Abnormal; Notable for the following components:      Result Value   Potassium 3.1 (*)    Glucose, Bld 100 (*)    Calcium 8.5 (*)    AST 157 (*)    ALT 116 (*)    All other components within normal limits  ETHANOL - Abnormal; Notable for the following components:   Alcohol, Ethyl (B) 373 (*)    All other components within normal limits  CBC WITH DIFFERENTIAL/PLATELET - Abnormal; Notable for the following components:   Platelets 102 (*)    All other components within normal limits  RESP PANEL BY RT-PCR (FLU A&B, COVID) ARPGX2  SARS CORONAVIRUS 2 BY RT PCR  MAGNESIUM  RAPID URINE DRUG SCREEN, HOSP PERFORMED  RAPID HIV SCREEN (HIV 1/2 AB+AG)    EKG None  Radiology No results found.  Procedures Procedures    Medications Ordered in ED Medications  LORazepam (ATIVAN) injection 0-4 mg (1 mg Intravenous Given 05/10/22 2144)    Or  LORazepam (ATIVAN) tablet 0-4 mg ( Oral See Alternative 05/10/22 2144)  LORazepam (ATIVAN) injection 0-4 mg (has no administration in time range)    Or  LORazepam (ATIVAN) tablet 0-4 mg (has no administration in time range)  thiamine (VITAMIN B1) tablet 100 mg ( Oral See Alternative 05/10/22 2144)    Or  thiamine (VITAMIN B1) injection 100 mg (100 mg Intravenous Given 05/10/22 2144)  potassium chloride SA (KLOR-CON M) CR tablet 40 mEq (has no administration in time range)    ED Course/ Medical Decision Making/ A&P Clinical Course as of 05/10/22 2248  Mon May 10, 2022  2238 Alcohol, Ethyl (B)(!!): 373 [MT]  2244 [name] medical work-up is noted for some hypokalemia likely related to alcohol consumption, mild transaminitis again in a pattern consistent with chronic alcohol consumption.  His ethanol level is also elevated.  He will board in the emergency department overnight, but aside from his intoxication at the moment he is medically cleared for TTS evaluation. [MT]  2246 At this time the patient remains  voluntary.  He is reporting that he is not actively suicidal and that he never was, that there was no over-ingestion of drugs at home.   [MT]    Clinical Course User Index [MT] Makenlee Mckeag, Carola Rhine, MD                           Medical Decision Making Amount and/or Complexity of Data Reviewed Labs: ordered. Decision-making details documented in ED Course.  Risk OTC drugs. Prescription drug management.   This patient presents to the ED  with concern for alcohol withdrawal.   Patient adamantly swears to myself and to the nurse in private that he was not in fact suicidal, that he only stated this in order to get a ride and a bed in the ED for detox.  Medical records do show that the patient was admitted to psychiatry services in June for intentional overdose.  At that time he was diagnosed with alcohol use disorder, PTSD, and a noted history of cocaine use as well as bipolar disorder with MDD.  Co-morbidities that complicate the patient evaluation: History of multiple psychiatric comorbidities high risk for self-harm behavior  Additional history obtained from EMS  External records from outside source obtained and reviewed including psychiatry office records and discharge summary 12/19/21  I ordered and personally interpreted labs.  The pertinent results include: See ED course   The patient was maintained on a cardiac monitor.  I personally viewed and interpreted the cardiac monitored which showed an underlying rhythm of: sinus tachycardia  I ordered medication including Ativan as needed per CIWA protocol.  Potassium for hypokalemia.  After the interventions noted above, I reevaluated the patient and found that they have: stayed the same  The patient is medically cleared, pending improved sobriety from acute alcohol consumption.         Final Clinical Impression(s) / ED Diagnoses Final diagnoses:  Acute alcohol use  Hypokalemia    Rx / DC Orders ED Discharge Orders      None         Wyvonnia Dusky, MD 05/10/22 2248

## 2022-05-10 NOTE — ED Notes (Signed)
EDP at bedside to complete MSE

## 2022-05-10 NOTE — ED Triage Notes (Addendum)
Pt BIB [facility] for overdose, pt reports taking 13-14 trazodone and drinking a bottle of vodka x 1 hour ago, pt stated to EMS staff that "he took enough to kill himself"   V/S upon arrival: HR 100 148/98 CBG 139  Pt reports to this RN during triage "I am not suicidal. I took them to help me sleep" Pt further reports he is concerned for having Covid due to vomiting daily x 1 week, pt further endorses drinking alcohol about "a gallon" of alcohol daily; pt also concerned for being HIV positive

## 2022-05-11 DIAGNOSIS — F1020 Alcohol dependence, uncomplicated: Secondary | ICD-10-CM

## 2022-05-11 DIAGNOSIS — F332 Major depressive disorder, recurrent severe without psychotic features: Secondary | ICD-10-CM

## 2022-05-11 LAB — RAPID URINE DRUG SCREEN, HOSP PERFORMED
Amphetamines: NOT DETECTED
Barbiturates: NOT DETECTED
Benzodiazepines: POSITIVE — AB
Cocaine: POSITIVE — AB
Opiates: NOT DETECTED
Tetrahydrocannabinol: NOT DETECTED

## 2022-05-11 MED ORDER — HYDROXYZINE HCL 25 MG PO TABS
50.0000 mg | ORAL_TABLET | Freq: Once | ORAL | Status: AC
  Administered 2022-05-11: 50 mg via ORAL
  Filled 2022-05-11: qty 2
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
## 2022-05-11 NOTE — ED Notes (Signed)
Pt currently speaking with mother via phone at this time, okay with Dr. Darl Householder to see if mother is willing to let pt come back home tonight or if he will need to stay for inpatient admit

## 2022-05-11 NOTE — Progress Notes (Addendum)
Pt was accepted to [facility] Tomorrow 05/12/22  Pt meets inpatient criteria per Delfin Gant, NP  Attending Physician will be Dr.Rajakumar Abbey Chatters, MD  Pt can arrive after 9:00am  Phone number for report: [phone] [place] Team notified: [facility], RN, Lucia Bitter, RN  [place], [place] 05/11/2022 @ 9:38 PM

## 2022-05-11 NOTE — Discharge Instructions (Addendum)
Transfer to [facility]

## 2022-05-11 NOTE — ED Notes (Signed)
This RN spoke with mother Kalman Shan), she voiced her concern that pt needs to stay for inpatient treatment, pt is aware and after much discussion has agreed to stay. Dr. Darl Householder made aware of mother's decions that pt needs to stay for inpatient criteria, at this time, pt remains involuntary, will IVC if pt tries to leave, pt made aware and verbalized understanding.

## 2022-05-11 NOTE — ED Notes (Addendum)
Pt was accepted to [facility] Tomorrow 05/12/22  Pt meets inpatient criteria per Delfin Gant, NP  Attending Physician will be Dr.Rajakumar Abbey Chatters, MD  Pt can arrive after 9:00am  Phone number for report: [phone]

## 2022-05-11 NOTE — ED Notes (Signed)
Pt will be accepted at [facility] tomorrow morning.
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
## 2022-05-11 NOTE — BH Assessment (Signed)
Per RN Caryl Comes, patient is too sleepy to participate in assessment at this time.

## 2022-05-11 NOTE — Progress Notes (Addendum)
Inpatient Behavioral Health Placement  Pt meets inpatient criteria per Delfin Gant, NP. Referral was sent to the following facilities;    Destination Service Provider Address Phone Fax  [facility]  [place], [place] [address] [place]  [facility]  [address] [place] [address] [phone]  [facility]  [place], [place] [address] [phone]  [facility]  [address] [place] [address] [place]  [facility]  [address], [place] [address] [phone]  [facility] [address], [place] [address] [phone]  [facility]  [address] [place] [address] [phone] [place]  [address], [place] [address] [phone] [facility]  [address], [place] [address] [phone]  [facility]  [address], [place] [address] [phone] [facility]  [address] [place] [address] [phone]  [facility]  [address] [place], [place] [address] [phone]  [facility]  [address], [place] [address] [phone] [facility]  [address] [place] [address] [phone]   Situation ongoing,  CSW will follow up.   Benjaman Kindler, MSW, LCSWA 05/11/2022  @ 6:59 PM;l

## 2022-05-12 MED ORDER — POTASSIUM CHLORIDE 20 MEQ PO PACK
40.0000 meq | PACK | Freq: Once | ORAL | Status: AC
  Administered 2022-05-12: 40 meq via ORAL
  Filled 2022-05-12: qty 2

## 2022-05-12 MED ORDER — CHLORDIAZEPOXIDE HCL 25 MG PO CAPS
25.0000 mg | ORAL_CAPSULE | Freq: Once | ORAL | Status: AC
  Administered 2022-05-12: 25 mg via ORAL
  Filled 2022-05-12: qty 1

## 2022-05-12 NOTE — ED Provider Notes (Addendum)
Emergency Medicine Observation Re-evaluation Note  [name] is a 41 y.o. male, seen on rounds today.  Pt initially presented to the ED for complaints of alcohol intoxication and possible overdose trazodone.  Pt currently calm, rest, nad. No new c/o this AM.   Physical Exam  BP (!) 135/106 (BP Location: Left Arm)   Pulse 82   Temp 98.7 F (37.1 C) (Oral)   Resp 16   Ht 1.676 m ('5\' 6"'$ )   Wt 73.9 kg   SpO2 100%   BMI 26.31 kg/m  Physical Exam General: resting, calm.  Cardiac: regular rate Lungs: breathing comfortably Psych: calm, resting.   ED Course / MDM    I have reviewed the labs performed to date as well as medications administered while in observation.  Recent changes in the last 24 hours include ED obs, reassessment.   Plan  [place] team indicates pt accepted to [facility], Dr Abbey Chatters.  Pt currently appears stable for transfer/transport.     Lajean Saver, MD 05/12/22 [phone]  Pt had asked for dose librium earlier - provided.  No tremor or shakes. Eating/drinking. No distress. Vitals normal.  Pt appears stable for transfer.       Lajean Saver, MD 05/12/22 [phone]

## 2022-05-12 NOTE — ED Notes (Signed)
Pt appears to be sleeping, observed even RR and unlabored, blanket on pt for warmth and comfort, NAD noted, pt within view of staff at the nurses station for safety, hall bed secured, plan of care on going, no further concerns as of present.

## 2022-05-12 NOTE — ED Notes (Addendum)
Error entry

## 2022-05-12 NOTE — ED Notes (Addendum)
Pt appears to be sleeping, observed even RR and unlabored, blanket on pt for warmth and comfort, pt remains within view of staff for safety, no further concerns as of present, pt to go to [facility] this am, report to bed called around 7:30 am to oncoming staff at [facility]. Pt can arrive after 9am

## 2022-05-12 NOTE — ED Notes (Signed)
Pt resting, alert, & awake, NAD noted, pt within view of staff for safety, hall bed secured, plan of care on going, no further concerns as of present

## 2022-05-18 ENCOUNTER — Other Ambulatory Visit: Payer: Self-pay | Admitting: Internal Medicine

## 2022-05-25 ENCOUNTER — Encounter (HOSPITAL_COMMUNITY): Payer: No Payment, Other | Admitting: Psychiatry

## 2022-05-25 ENCOUNTER — Encounter (HOSPITAL_COMMUNITY): Payer: Self-pay
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
## 2022-05-25 NOTE — Progress Notes (Signed)
This encounter was created in error - please disregard.

## 2022-06-03 ENCOUNTER — Other Ambulatory Visit: Payer: Self-pay

## 2022-06-03 ENCOUNTER — Inpatient Hospital Stay (HOSPITAL_COMMUNITY): Payer: Commercial Managed Care - HMO

## 2022-06-03 ENCOUNTER — Encounter (HOSPITAL_COMMUNITY): Payer: Self-pay

## 2022-06-03 ENCOUNTER — Emergency Department (HOSPITAL_COMMUNITY): Payer: Commercial Managed Care - HMO

## 2022-06-03 ENCOUNTER — Inpatient Hospital Stay (HOSPITAL_COMMUNITY)
Admission: EM | Admit: 2022-06-03 | Discharge: 2022-06-07 | DRG: 917 | Disposition: A | Discharge status: Other Acute Inpatient Hospital | Payer: Commercial Managed Care - HMO | Attending: Internal Medicine | Admitting: Internal Medicine

## 2022-06-03 DIAGNOSIS — Y908 Blood alcohol level of 240 mg/100 ml or more: Secondary | ICD-10-CM | POA: Diagnosis present

## 2022-06-03 DIAGNOSIS — E7800 Pure hypercholesterolemia, unspecified: Secondary | ICD-10-CM | POA: Diagnosis present

## 2022-06-03 DIAGNOSIS — T50902D Poisoning by unspecified drugs, medicaments and biological substances, intentional self-harm, subsequent encounter: Secondary | ICD-10-CM | POA: Diagnosis not present

## 2022-06-03 DIAGNOSIS — Z8349 Family history of other endocrine, nutritional and metabolic diseases: Secondary | ICD-10-CM

## 2022-06-03 DIAGNOSIS — Z79899 Other long term (current) drug therapy: Secondary | ICD-10-CM

## 2022-06-03 DIAGNOSIS — G928 Other toxic encephalopathy: Secondary | ICD-10-CM | POA: Diagnosis present

## 2022-06-03 DIAGNOSIS — G4700 Insomnia, unspecified: Secondary | ICD-10-CM | POA: Diagnosis present

## 2022-06-03 DIAGNOSIS — D72819 Decreased white blood cell count, unspecified: Secondary | ICD-10-CM | POA: Diagnosis present

## 2022-06-03 DIAGNOSIS — R9431 Abnormal electrocardiogram [ECG] [EKG]: Secondary | ICD-10-CM | POA: Diagnosis not present

## 2022-06-03 DIAGNOSIS — Z8249 Family history of ischemic heart disease and other diseases of the circulatory system: Secondary | ICD-10-CM | POA: Diagnosis not present

## 2022-06-03 DIAGNOSIS — T1491XA Suicide attempt, initial encounter: Secondary | ICD-10-CM | POA: Diagnosis not present

## 2022-06-03 DIAGNOSIS — F1410 Cocaine abuse, uncomplicated: Secondary | ICD-10-CM | POA: Diagnosis present

## 2022-06-03 DIAGNOSIS — J9601 Acute respiratory failure with hypoxia: Secondary | ICD-10-CM | POA: Diagnosis present

## 2022-06-03 DIAGNOSIS — T50902A Poisoning by unspecified drugs, medicaments and biological substances, intentional self-harm, initial encounter: Secondary | ICD-10-CM | POA: Diagnosis not present

## 2022-06-03 DIAGNOSIS — D61818 Other pancytopenia: Secondary | ICD-10-CM | POA: Diagnosis present

## 2022-06-03 DIAGNOSIS — Z853 Personal history of malignant neoplasm of breast: Secondary | ICD-10-CM | POA: Diagnosis not present

## 2022-06-03 DIAGNOSIS — E876 Hypokalemia: Secondary | ICD-10-CM | POA: Diagnosis present

## 2022-06-03 DIAGNOSIS — F17210 Nicotine dependence, cigarettes, uncomplicated: Secondary | ICD-10-CM | POA: Diagnosis present

## 2022-06-03 DIAGNOSIS — Z905 Acquired absence of kidney: Secondary | ICD-10-CM

## 2022-06-03 DIAGNOSIS — Z9151 Personal history of suicidal behavior: Secondary | ICD-10-CM

## 2022-06-03 DIAGNOSIS — Z818 Family history of other mental and behavioral disorders: Secondary | ICD-10-CM

## 2022-06-03 DIAGNOSIS — Z8616 Personal history of COVID-19: Secondary | ICD-10-CM

## 2022-06-03 DIAGNOSIS — F10220 Alcohol dependence with intoxication, uncomplicated: Secondary | ICD-10-CM | POA: Diagnosis present

## 2022-06-03 DIAGNOSIS — F4310 Post-traumatic stress disorder, unspecified: Secondary | ICD-10-CM | POA: Diagnosis present

## 2022-06-03 DIAGNOSIS — F1024 Alcohol dependence with alcohol-induced mood disorder: Secondary | ICD-10-CM | POA: Diagnosis present

## 2022-06-03 DIAGNOSIS — F209 Schizophrenia, unspecified: Secondary | ICD-10-CM | POA: Diagnosis present

## 2022-06-03 DIAGNOSIS — I2510 Atherosclerotic heart disease of native coronary artery without angina pectoris: Secondary | ICD-10-CM | POA: Diagnosis present

## 2022-06-03 DIAGNOSIS — Z91018 Allergy to other foods: Secondary | ICD-10-CM

## 2022-06-03 DIAGNOSIS — Z885 Allergy status to narcotic agent status: Secondary | ICD-10-CM

## 2022-06-03 DIAGNOSIS — F10920 Alcohol use, unspecified with intoxication, uncomplicated: Secondary | ICD-10-CM

## 2022-06-03 DIAGNOSIS — I739 Peripheral vascular disease, unspecified: Secondary | ICD-10-CM | POA: Diagnosis present

## 2022-06-03 DIAGNOSIS — I10 Essential (primary) hypertension: Secondary | ICD-10-CM | POA: Diagnosis present

## 2022-06-03 DIAGNOSIS — F319 Bipolar disorder, unspecified: Secondary | ICD-10-CM | POA: Diagnosis present

## 2022-06-03 DIAGNOSIS — T50901A Poisoning by unspecified drugs, medicaments and biological substances, accidental (unintentional), initial encounter: Secondary | ICD-10-CM | POA: Diagnosis present

## 2022-06-03 DIAGNOSIS — T43222A Poisoning by selective serotonin reuptake inhibitors, intentional self-harm, initial encounter: Principal | ICD-10-CM | POA: Diagnosis present

## 2022-06-03 DIAGNOSIS — G40909 Epilepsy, unspecified, not intractable, without status epilepticus: Secondary | ICD-10-CM | POA: Diagnosis present

## 2022-06-03 DIAGNOSIS — Z880 Allergy status to penicillin: Secondary | ICD-10-CM

## 2022-06-03 DIAGNOSIS — Z1152 Encounter for screening for COVID-19: Secondary | ICD-10-CM

## 2022-06-03 DIAGNOSIS — Z9013 Acquired absence of bilateral breasts and nipples: Secondary | ICD-10-CM

## 2022-06-03 DIAGNOSIS — D696 Thrombocytopenia, unspecified: Secondary | ICD-10-CM

## 2022-06-03 LAB — SARS CORONAVIRUS 2 BY RT PCR: SARS Coronavirus 2 by RT PCR: NEGATIVE

## 2022-06-03 LAB — BASIC METABOLIC PANEL
Anion gap: 8 (ref 5–15)
Anion gap: 8 (ref 5–15)
BUN: 5 mg/dL — ABNORMAL LOW (ref 6–20)
BUN: <5 mg/dL — ABNORMAL LOW (ref 6–20)
CO2: 22 mmol/L (ref 22–32)
CO2: 22 mmol/L (ref 22–32)
Calcium: 7.1 mg/dL — ABNORMAL LOW (ref 8.9–10.3)
Calcium: 7.5 mg/dL — ABNORMAL LOW (ref 8.9–10.3)
Chloride: 111 mmol/L (ref 98–111)
Chloride: 108 mmol/L (ref 98–111)
Creatinine, Ser: 0.64 mg/dL (ref 0.61–1.24)
Creatinine, Ser: 0.53 mg/dL — ABNORMAL LOW (ref 0.61–1.24)
GFR, Estimated: >60 mL/min (ref >60)
GFR, Estimated: >60 mL/min (ref >60)
Glucose, Bld: 148 mg/dL — ABNORMAL HIGH (ref 70–99)
Glucose, Bld: 76 mg/dL (ref 70–99)
Potassium: 3.4 mmol/L — ABNORMAL LOW (ref 3.5–5.1)
Potassium: 4.4 mmol/L (ref 3.5–5.1)
Sodium: 141 mmol/L (ref 135–145)
Sodium: 138 mmol/L (ref 135–145)

## 2022-06-03 LAB — TSH: TSH: 0.568 u[IU]/mL (ref 0.350–4.500)

## 2022-06-03 LAB — GLUCOSE, CAPILLARY
Glucose-Capillary: 142 mg/dL — ABNORMAL HIGH (ref 70–99)
Glucose-Capillary: 97 mg/dL (ref 70–99)
Glucose-Capillary: 85 mg/dL (ref 70–99)
Glucose-Capillary: 96 mg/dL (ref 70–99)

## 2022-06-03 LAB — CBC
HCT: 35.1 % — ABNORMAL LOW (ref 39.0–52.0)
HCT: 35.9 % — ABNORMAL LOW (ref 39.0–52.0)
Hemoglobin: 11.7 g/dL — ABNORMAL LOW (ref 13.0–17.0)
Hemoglobin: 12 g/dL — ABNORMAL LOW (ref 13.0–17.0)
MCH: 31.4 pg (ref 26.0–34.0)
MCH: 30.6 pg (ref 26.0–34.0)
MCHC: 33.3 g/dL (ref 30.0–36.0)
MCHC: 33.4 g/dL (ref 30.0–36.0)
MCV: 94.1 fL (ref 80.0–100.0)
MCV: 91.6 fL (ref 80.0–100.0)
Platelets: 80 10*3/uL — ABNORMAL LOW (ref 150–400)
Platelets: 77 10*3/uL — ABNORMAL LOW (ref 150–400)
RBC: 3.73 MIL/uL — ABNORMAL LOW (ref 4.22–5.81)
RBC: 3.92 MIL/uL — ABNORMAL LOW (ref 4.22–5.81)
RDW: 15.3 % (ref 11.5–15.5)
RDW: 15.3 % (ref 11.5–15.5)
WBC: 2.9 10*3/uL — ABNORMAL LOW (ref 4.0–10.5)
WBC: 3.3 10*3/uL — ABNORMAL LOW (ref 4.0–10.5)
nRBC: 0 % (ref 0.0–0.2)
nRBC: 0 % (ref 0.0–0.2)

## 2022-06-03 LAB — CBC WITH DIFFERENTIAL/PLATELET
Abs Immature Granulocytes: 0.01 10*3/uL (ref 0.00–0.07)
Basophils Absolute: 0 10*3/uL (ref 0.0–0.1)
Basophils Relative: 1 %
Eosinophils Absolute: 0 10*3/uL (ref 0.0–0.5)
Eosinophils Relative: 0 %
HCT: 43.4 % (ref 39.0–52.0)
Hemoglobin: 14.9 g/dL (ref 13.0–17.0)
Immature Granulocytes: 0 %
Lymphocytes Relative: 57 %
Lymphs Abs: 1.7 10*3/uL (ref 0.7–4.0)
MCH: 31.2 pg (ref 26.0–34.0)
MCHC: 34.3 g/dL (ref 30.0–36.0)
MCV: 90.8 fL (ref 80.0–100.0)
Monocytes Absolute: 0.3 10*3/uL (ref 0.1–1.0)
Monocytes Relative: 11 %
Neutro Abs: 0.9 10*3/uL — ABNORMAL LOW (ref 1.7–7.7)
Neutrophils Relative %: 31 %
Platelets: 94 10*3/uL — ABNORMAL LOW (ref 150–400)
RBC: 4.78 MIL/uL (ref 4.22–5.81)
RDW: 15 % (ref 11.5–15.5)
WBC: 2.9 10*3/uL — ABNORMAL LOW (ref 4.0–10.5)
nRBC: 0 % (ref 0.0–0.2)

## 2022-06-03 LAB — COMPREHENSIVE METABOLIC PANEL
ALT: 61 U/L — ABNORMAL HIGH (ref 0–44)
AST: 94 U/L — ABNORMAL HIGH (ref 15–41)
Albumin: 3.7 g/dL (ref 3.5–5.0)
Alkaline Phosphatase: 55 U/L (ref 38–126)
Anion gap: 11 (ref 5–15)
BUN: 6 mg/dL (ref 6–20)
CO2: 21 mmol/L — ABNORMAL LOW (ref 22–32)
Calcium: 8 mg/dL — ABNORMAL LOW (ref 8.9–10.3)
Chloride: 108 mmol/L (ref 98–111)
Creatinine, Ser: 0.72 mg/dL (ref 0.61–1.24)
GFR, Estimated: >60 mL/min (ref >60)
Glucose, Bld: 100 mg/dL — ABNORMAL HIGH (ref 70–99)
Potassium: 3.7 mmol/L (ref 3.5–5.1)
Sodium: 140 mmol/L (ref 135–145)
Total Bilirubin: 0.5 mg/dL (ref 0.3–1.2)
Total Protein: 7.4 g/dL (ref 6.5–8.1)

## 2022-06-03 LAB — RAPID URINE DRUG SCREEN, HOSP PERFORMED
Amphetamines: NOT DETECTED
Barbiturates: NOT DETECTED
Benzodiazepines: NOT DETECTED
Cocaine: NOT DETECTED
Opiates: NOT DETECTED
Tetrahydrocannabinol: NOT DETECTED

## 2022-06-03 LAB — ACETAMINOPHEN LEVEL
Acetaminophen (Tylenol), Serum: <10 ug/mL — ABNORMAL LOW (ref 10–30)
Acetaminophen (Tylenol), Serum: <10 ug/mL — ABNORMAL LOW (ref 10–30)

## 2022-06-03 LAB — MAGNESIUM
Magnesium: 2.1 mg/dL (ref 1.7–2.4)
Magnesium: 1.7 mg/dL (ref 1.7–2.4)

## 2022-06-03 LAB — FOLATE: Folate: 30.1 ng/mL (ref >5.9)

## 2022-06-03 LAB — BLOOD GAS, ARTERIAL
Acid-base deficit: 6.4 mmol/L — ABNORMAL HIGH (ref 0.0–2.0)
Bicarbonate: 20.2 mmol/L (ref 20.0–28.0)
Drawn by: 50037
FIO2: 100 %
MECHVT: 510 mL
O2 Saturation: 98.9 %
PEEP: 5 cmH2O
Patient temperature: 37
pCO2 arterial: 43 mmHg (ref 32–48)
pH, Arterial: 7.28 — ABNORMAL LOW (ref 7.35–7.45)
pO2, Arterial: 358 mmHg — ABNORMAL HIGH (ref 83–108)

## 2022-06-03 LAB — PHOSPHORUS: Phosphorus: 2.1 mg/dL — ABNORMAL LOW (ref 2.5–4.6)

## 2022-06-03 LAB — VITAMIN B12: Vitamin B-12: 221 pg/mL (ref 180–914)

## 2022-06-03 LAB — SALICYLATE LEVEL: Salicylate Lvl: <7 mg/dL — ABNORMAL LOW (ref 7.0–30.0)

## 2022-06-03 LAB — CBG MONITORING, ED: Glucose-Capillary: 110 mg/dL — ABNORMAL HIGH (ref 70–99)

## 2022-06-03 LAB — ETHANOL: Alcohol, Ethyl (B): 403 mg/dL (ref <10)

## 2022-06-03 LAB — MRSA NEXT GEN BY PCR, NASAL: MRSA by PCR Next Gen: NOT DETECTED

## 2022-06-03 MED ORDER — ACTIDOSE WITH SORBITOL 50 GM/240ML PO SUSP
50.0000 g | Freq: Once | ORAL | Status: AC
  Administered 2022-06-03: 50 g via ORAL
  Filled 2022-06-03: qty 240

## 2022-06-03 MED ORDER — LOPERAMIDE HCL 2 MG PO CAPS
2.0000 mg | ORAL_CAPSULE | ORAL | Status: AC | PRN
  Administered 2022-06-05: 4 mg via ORAL
  Filled 2022-06-03: qty 2

## 2022-06-03 MED ORDER — SODIUM BICARBONATE 8.4 % IV SOLN
Freq: Once | INTRAVENOUS | Status: AC
  Filled 2022-06-03: qty 150

## 2022-06-03 MED ORDER — PROPOFOL 1000 MG/100ML IV EMUL
0.0000 ug/kg/min | INTRAVENOUS | Status: DC
  Administered 2022-06-03: 50 ug/kg/min via INTRAVENOUS
  Filled 2022-06-03 (×2): qty 100

## 2022-06-03 MED ORDER — IOHEXOL 350 MG/ML SOLN
75.0000 mL | Freq: Once | INTRAVENOUS | Status: AC | PRN
  Administered 2022-06-03: 75 mL via INTRAVENOUS

## 2022-06-03 MED ORDER — ONDANSETRON 4 MG PO TBDP: 4.0000 mg | ORAL_TABLET | Freq: Four times a day (QID) | ORAL | Status: AC | PRN

## 2022-06-03 MED ORDER — SODIUM PHOSPHATES 45 MMOLE/15ML IV SOLN: 30.0000 mmol | Freq: Once | INTRAVENOUS | Status: DC

## 2022-06-03 MED ORDER — FOLIC ACID 1 MG PO TABS
1.0000 mg | ORAL_TABLET | Freq: Every day | ORAL | Status: DC
  Administered 2022-06-03 – 2022-06-07 (×5): 1 mg via ORAL
  Filled 2022-06-03 (×5): qty 1

## 2022-06-03 MED ORDER — ADULT MULTIVITAMIN W/MINERALS CH: 1.0000 | ORAL_TABLET | Freq: Every day | ORAL | Status: DC

## 2022-06-03 MED ORDER — ETOMIDATE 2 MG/ML IV SOLN
INTRAVENOUS | Status: AC
  Filled 2022-06-03: qty 20

## 2022-06-03 MED ORDER — ADULT MULTIVITAMIN W/MINERALS CH
1.0000 | ORAL_TABLET | Freq: Every day | ORAL | Status: DC
  Administered 2022-06-03 – 2022-06-07 (×5): 1 via ORAL
  Filled 2022-06-03 (×5): qty 1

## 2022-06-03 MED ORDER — CHLORDIAZEPOXIDE HCL 25 MG PO CAPS
25.0000 mg | ORAL_CAPSULE | Freq: Four times a day (QID) | ORAL | Status: AC
  Administered 2022-06-03 – 2022-06-04 (×4): 25 mg via ORAL
  Filled 2022-06-03 (×4): qty 1

## 2022-06-03 MED ORDER — CHLORDIAZEPOXIDE HCL 25 MG PO CAPS: 25.0000 mg | ORAL_CAPSULE | Freq: Four times a day (QID) | ORAL | Status: AC | PRN

## 2022-06-03 MED ORDER — FENTANYL CITRATE PF 50 MCG/ML IJ SOSY
50.0000 ug | PREFILLED_SYRINGE | INTRAMUSCULAR | Status: DC | PRN
  Administered 2022-06-03: 100 ug via INTRAVENOUS
  Administered 2022-06-03: 50 ug via INTRAVENOUS
  Filled 2022-06-03 (×2): qty 2

## 2022-06-03 MED ORDER — DOCUSATE SODIUM 50 MG/5ML PO LIQD: 100.0000 mg | Freq: Two times a day (BID) | ORAL | Status: DC | PRN

## 2022-06-03 MED ORDER — MIDAZOLAM HCL 2 MG/2ML IJ SOLN
INTRAMUSCULAR | Status: AC
  Filled 2022-06-03: qty 2

## 2022-06-03 MED ORDER — ORAL CARE MOUTH RINSE: 15.0000 mL | OROMUCOSAL | Status: DC | PRN

## 2022-06-03 MED ORDER — ROCURONIUM BROMIDE 10 MG/ML (PF) SYRINGE
PREFILLED_SYRINGE | INTRAVENOUS | Status: AC
  Filled 2022-06-03: qty 10

## 2022-06-03 MED ORDER — POTASSIUM CHLORIDE 10 MEQ/100ML IV SOLN
10.0000 meq | INTRAVENOUS | Status: AC
  Administered 2022-06-03 (×2): 10 meq via INTRAVENOUS
  Filled 2022-06-03 (×2): qty 100

## 2022-06-03 MED ORDER — PHENOBARBITAL SODIUM 130 MG/ML IJ SOLN
130.0000 mg | Freq: Three times a day (TID) | INTRAMUSCULAR | Status: DC
  Administered 2022-06-03 – 2022-06-04 (×4): 130 mg via INTRAVENOUS
  Filled 2022-06-03 (×4): qty 1

## 2022-06-03 MED ORDER — HEPARIN SODIUM (PORCINE) 5000 UNIT/ML IJ SOLN
5000.0000 [IU] | Freq: Three times a day (TID) | INTRAMUSCULAR | Status: DC
  Administered 2022-06-03 – 2022-06-07 (×14): 5000 [IU] via SUBCUTANEOUS
  Filled 2022-06-03 (×14): qty 1

## 2022-06-03 MED ORDER — ROCURONIUM BROMIDE 50 MG/5ML IV SOLN
INTRAVENOUS | Status: DC | PRN
  Administered 2022-06-03: 80 mg via INTRAVENOUS

## 2022-06-03 MED ORDER — POLYETHYLENE GLYCOL 3350 17 G PO PACK: 17.0000 g | PACK | Freq: Every day | ORAL | Status: DC

## 2022-06-03 MED ORDER — CHLORDIAZEPOXIDE HCL 25 MG PO CAPS: 25.0000 mg | ORAL_CAPSULE | Freq: Every day | ORAL | Status: DC

## 2022-06-03 MED ORDER — MIDAZOLAM HCL 2 MG/2ML IJ SOLN: 1.0000 mg | INTRAMUSCULAR | Status: DC | PRN

## 2022-06-03 MED ORDER — PROPOFOL 1000 MG/100ML IV EMUL: 5.0000 ug/kg/min | INTRAVENOUS | Status: DC

## 2022-06-03 MED ORDER — HYDROXYZINE HCL 25 MG PO TABS: 25.0000 mg | ORAL_TABLET | Freq: Four times a day (QID) | ORAL | Status: AC | PRN

## 2022-06-03 MED ORDER — POTASSIUM CHLORIDE 10 MEQ/100ML IV SOLN
10.0000 meq | INTRAVENOUS | Status: AC
  Administered 2022-06-03 (×4): 10 meq via INTRAVENOUS
  Filled 2022-06-03 (×3): qty 100

## 2022-06-03 MED ORDER — POTASSIUM PHOSPHATES 15 MMOLE/5ML IV SOLN
30.0000 mmol | Freq: Once | INTRAVENOUS | Status: AC
  Administered 2022-06-03: 30 mmol via INTRAVENOUS
  Filled 2022-06-03: qty 10

## 2022-06-03 MED ORDER — CHLORHEXIDINE GLUCONATE CLOTH 2 % EX PADS
6.0000 | MEDICATED_PAD | Freq: Every day | CUTANEOUS | Status: DC
  Administered 2022-06-03 – 2022-06-04 (×3): 6 via TOPICAL

## 2022-06-03 MED ORDER — FENTANYL CITRATE PF 50 MCG/ML IJ SOSY
50.0000 ug | PREFILLED_SYRINGE | INTRAMUSCULAR | Status: DC | PRN
  Administered 2022-06-03: 50 ug via INTRAVENOUS

## 2022-06-03 MED ORDER — THIAMINE HCL 100 MG/ML IJ SOLN
100.0000 mg | Freq: Once | INTRAMUSCULAR | Status: AC
  Administered 2022-06-03: 100 mg via INTRAMUSCULAR

## 2022-06-03 MED ORDER — LORAZEPAM 1 MG PO TABS
1.0000 mg | ORAL_TABLET | ORAL | Status: AC | PRN
  Administered 2022-06-03 (×2): 2 mg via ORAL
  Administered 2022-06-04: 1 mg via ORAL
  Administered 2022-06-04: 2 mg via ORAL
  Administered 2022-06-04 – 2022-06-06 (×4): 1 mg via ORAL
  Filled 2022-06-03 (×2): qty 1
  Filled 2022-06-03: qty 2
  Filled 2022-06-03: qty 1
  Filled 2022-06-03 (×2): qty 2
  Filled 2022-06-03 (×2): qty 1

## 2022-06-03 MED ORDER — ETOMIDATE 2 MG/ML IV SOLN
INTRAVENOUS | Status: DC | PRN
  Administered 2022-06-03: 20 mg via INTRAVENOUS

## 2022-06-03 MED ORDER — SODIUM CHLORIDE (PF) 0.9 % IJ SOLN
INTRAMUSCULAR | Status: AC
  Filled 2022-06-03: qty 50

## 2022-06-03 MED ORDER — DOCUSATE SODIUM 50 MG/5ML PO LIQD
100.0000 mg | Freq: Two times a day (BID) | ORAL | Status: DC
  Filled 2022-06-03 (×3): qty 10

## 2022-06-03 MED ORDER — CHARCOAL ACTIVATED PO LIQD
50.0000 g | Freq: Once | ORAL | Status: AC
  Administered 2022-06-03: 50 g via ORAL
  Filled 2022-06-03: qty 240

## 2022-06-03 MED ORDER — THIAMINE MONONITRATE 100 MG PO TABS
100.0000 mg | ORAL_TABLET | Freq: Every day | ORAL | Status: DC
  Administered 2022-06-03 – 2022-06-07 (×5): 100 mg via ORAL
  Filled 2022-06-03 (×5): qty 1

## 2022-06-03 MED ORDER — CHLORDIAZEPOXIDE HCL 25 MG PO CAPS: 25.0000 mg | ORAL_CAPSULE | ORAL | Status: DC

## 2022-06-03 MED ORDER — ORAL CARE MOUTH RINSE
15.0000 mL | OROMUCOSAL | Status: DC
  Administered 2022-06-03 (×3): 15 mL via OROMUCOSAL

## 2022-06-03 MED ORDER — LORAZEPAM 2 MG/ML IJ SOLN
1.0000 mg | INTRAMUSCULAR | Status: AC | PRN
  Filled 2022-06-03: qty 1

## 2022-06-03 MED ORDER — MAGNESIUM SULFATE 2 GM/50ML IV SOLN
2.0000 g | Freq: Once | INTRAVENOUS | Status: AC
  Administered 2022-06-03: 2 g via INTRAVENOUS
  Filled 2022-06-03: qty 50

## 2022-06-03 MED ORDER — LACTATED RINGERS IV BOLUS
1000.0000 mL | Freq: Once | INTRAVENOUS | Status: AC
  Administered 2022-06-03: 1000 mL via INTRAVENOUS

## 2022-06-03 MED ORDER — PANTOPRAZOLE SODIUM 40 MG IV SOLR: 40.0000 mg | Freq: Every day | INTRAVENOUS | Status: DC

## 2022-06-03 MED ORDER — POTASSIUM CHLORIDE 10 MEQ/100ML IV SOLN
INTRAVENOUS | Status: AC
  Filled 2022-06-03: qty 100

## 2022-06-03 MED ORDER — FAMOTIDINE 20 MG PO TABS
20.0000 mg | ORAL_TABLET | Freq: Two times a day (BID) | ORAL | Status: DC
  Filled 2022-06-03: qty 1

## 2022-06-03 MED ORDER — POLYETHYLENE GLYCOL 3350 17 G PO PACK: 17.0000 g | PACK | Freq: Every day | ORAL | Status: DC | PRN

## 2022-06-03 MED ORDER — SUCCINYLCHOLINE CHLORIDE 200 MG/10ML IV SOSY
PREFILLED_SYRINGE | INTRAVENOUS | Status: AC
  Filled 2022-06-03: qty 10

## 2022-06-03 MED ORDER — CHLORDIAZEPOXIDE HCL 25 MG PO CAPS: 25.0000 mg | ORAL_CAPSULE | Freq: Three times a day (TID) | ORAL | Status: DC

## 2022-06-03 MED ORDER — POTASSIUM & SODIUM PHOSPHATES 280-160-250 MG PO PACK
2.0000 | PACK | Freq: Three times a day (TID) | ORAL | Status: DC
  Administered 2022-06-03 – 2022-06-07 (×13): 2
  Filled 2022-06-03 (×23): qty 2

## 2022-06-03 MED ORDER — THIAMINE HCL 100 MG/ML IJ SOLN
100.0000 mg | Freq: Every day | INTRAMUSCULAR | Status: DC
  Filled 2022-06-03 (×2): qty 2

## 2022-06-03 MED ORDER — DEXTROSE IN LACTATED RINGERS 5 % IV SOLN: INTRAVENOUS | Status: DC

## 2022-06-03 MED ORDER — VECURONIUM BROMIDE 10 MG IV SOLR
10.0000 mg | Freq: Once | INTRAVENOUS | Status: AC
  Administered 2022-06-03: 10 mg via INTRAVENOUS
  Filled 2022-06-03: qty 10

## 2022-06-03 MED ORDER — PROPOFOL 1000 MG/100ML IV EMUL
INTRAVENOUS | Status: AC
  Administered 2022-06-03: 5 ug/kg/min via INTRAVENOUS
  Filled 2022-06-03: qty 100

## 2022-06-03 NOTE — Progress Notes (Signed)
eLink Physician-Brief Progress Note Patient Name: Jhoel Stieg DOB: [birth year] MRN: [id number]   Date of Service  06/03/2022  HPI/Events of Note  Patient arrived in ICU with Foley catheter. No order for Foley catheter.   eICU Interventions  Plan: Continue Foley catheter.     Intervention Category Major Interventions: Other:  Konnor Vondrasek Cornelia Copa 06/03/2022, 6:03 AM
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
## 2022-06-03 NOTE — ED Notes (Signed)
This RN called [employer], and spoke to [place]. Per recommendations: "watch for QT and QTc prolongation, replace electrolytes - goal K+=4, goal Mg=2; monitor for seizures, activated charcoal recommended; monitor for hypotension"
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
## 2022-06-03 NOTE — Progress Notes (Signed)
Contacted by RN regarding CCM order to advance the ETT 3cm.  Upon arrival to room, the ETT was at 22cm @ the lip.  The ETT was advanced to 25cm @ the lip prior to the repeat CXR.

## 2022-06-03 NOTE — Procedures (Signed)
Extubation Procedure Note  Patient Details:   Name: [name] DOB: 08-Jan-1981 MRN: [id number]   Airway Documentation:    Vent end date: 06/03/22 Vent end time: 0912   Evaluation  O2 sats: stable throughout Complications: No apparent complications Patient did tolerate procedure well. Bilateral Breath Sounds: Clear, Diminished   Yes  Johnette Abraham 06/03/2022, 9:13 AM

## 2022-06-03 NOTE — Progress Notes (Signed)
eLink Physician-Brief Progress Note Patient Name: [name] DOB: March 01, 1981 MRN: [id number]   Date of Service  06/03/2022  HPI/Events of Note  Review of CXR reveals that the ETT has been partially withdrawn and is now seen 6.5 cm above the carina, above the level of the clavicular heads at the thoracic inlet.  eICU Interventions  Plan: Advance ETT 3 cm and re-secure.  Repeat portable CXR post advancing ETT.      Intervention Category Major Interventions: Other:  Lysle Dingwall 06/03/2022, 4:23 AM
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
## 2022-06-03 NOTE — Progress Notes (Signed)
Initial Nutrition Assessment  DOCUMENTATION CODES:   Not applicable  INTERVENTION:  - Continue Regular diet.  - Continue daily multivitamin with minerals, folic acid, and thiamine due to history of alcohol abuse.   NUTRITION DIAGNOSIS:   Inadequate oral intake related to acute illness (psychiatric medical conditions; admitted after overdose) as evidenced by other (comment) (admitted after overdose, diet just advanced). *Suspect patient will eat well but will monitor  GOAL:   Patient will meet greater than or equal to 90% of their needs  MONITOR:   PO intake, Weight trends, Labs  REASON FOR ASSESSMENT:   Ventilator    ASSESSMENT:   41 y.o. male with PMH alcohol abuse, CAD, HLD, MDD, anxiety, bipolar disorder who presented after overdose.   Patient extubated prior to visit this morning. He reports a UBW of 160# and denied recent changes in weight. Typically snacks throughout the day but notes he likes to cook. Appetite is usually good. Patient eager to have diet advanced and is feeling hungry. Diet advanced to regular after visit. No reported concerns.  Medications reviewed and include: Colace, Miralax, K+/Phos replacements QID, D5% at 62m/hr, MVI with minerals, folic acid, thiamine  Labs reviewed:  K+ 3.4 Phos 2.1   NUTRITION - FOCUSED PHYSICAL EXAM:  Flowsheet Row Most Recent Value  Orbital Region No depletion  Upper Arm Region Mild depletion  Thoracic and Lumbar Region No depletion  Buccal Region No depletion  Temple Region Mild depletion  Clavicle Bone Region No depletion  Clavicle and Acromion Bone Region No depletion  Scapular Bone Region Unable to assess  Dorsal Hand No depletion  Patellar Region No depletion  Anterior Thigh Region No depletion  Posterior Calf Region Mild depletion  Edema (RD Assessment) None  Hair Reviewed  Eyes Reviewed  Mouth Reviewed  Skin Reviewed  Nails Reviewed       Diet Order:   Diet Order             Diet regular  Room service appropriate? Yes; Fluid consistency: Thin  Diet effective now                   EDUCATION NEEDS:   Not appropriate for education at this time  Skin:  Skin Assessment: Reviewed RN Assessment  Last BM:  PTA  Height:  Ht Readings from Last 1 Encounters:  06/03/22 '5\' 6"'$  (1.676 m)   Weight:  Wt Readings from Last 1 Encounters:  06/03/22 75.6 kg    BMI:  Body mass index is 26.9 kg/m.  Estimated Nutritional Needs:  Kcal:  1900-2250 kcal Protein:  75-90 grams Fluid:  >/= 1.9L    ASamson FredericRD, LDN For contact information, refer to [facility]

## 2022-06-03 NOTE — Progress Notes (Signed)
[place] Progress Note Patient Name: Darcell Sabino DOB: 04/13/1981 MRN: [id number]   Date of Service  06/03/2022  HPI/Events of Note  Hypokalemia - K+ = 3.4. Patient still getting KCl run.   eICU Interventions  Plan: Repeat BMP at 8 AM.     Intervention Category Major Interventions: Electrolyte abnormality - evaluation and management  Kailia Starry [place] 06/03/2022, 5:56 AM

## 2022-06-03 NOTE — Progress Notes (Signed)
PT transported to CT and back.
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
## 2022-06-04 ENCOUNTER — Inpatient Hospital Stay (HOSPITAL_COMMUNITY): Payer: Commercial Managed Care - HMO

## 2022-06-04 ENCOUNTER — Other Ambulatory Visit: Payer: Self-pay

## 2022-06-04 ENCOUNTER — Encounter (HOSPITAL_COMMUNITY): Payer: Self-pay | Admitting: Pulmonary Disease

## 2022-06-04 DIAGNOSIS — T50902A Poisoning by unspecified drugs, medicaments and biological substances, intentional self-harm, initial encounter: Secondary | ICD-10-CM | POA: Diagnosis not present

## 2022-06-04 DIAGNOSIS — G40909 Epilepsy, unspecified, not intractable, without status epilepticus: Secondary | ICD-10-CM | POA: Diagnosis not present

## 2022-06-04 DIAGNOSIS — F4310 Post-traumatic stress disorder, unspecified: Secondary | ICD-10-CM | POA: Diagnosis not present

## 2022-06-04 DIAGNOSIS — T1491XA Suicide attempt, initial encounter: Secondary | ICD-10-CM

## 2022-06-04 DIAGNOSIS — F332 Major depressive disorder, recurrent severe without psychotic features: Secondary | ICD-10-CM

## 2022-06-04 DIAGNOSIS — T50902D Poisoning by unspecified drugs, medicaments and biological substances, intentional self-harm, subsequent encounter: Secondary | ICD-10-CM

## 2022-06-04 LAB — MAGNESIUM: Magnesium: 1.9 mg/dL (ref 1.7–2.4)

## 2022-06-04 LAB — BASIC METABOLIC PANEL
Anion gap: 6 (ref 5–15)
BUN: <5 mg/dL — ABNORMAL LOW (ref 6–20)
CO2: 23 mmol/L (ref 22–32)
Calcium: 8.1 mg/dL — ABNORMAL LOW (ref 8.9–10.3)
Chloride: 104 mmol/L (ref 98–111)
Creatinine, Ser: 0.73 mg/dL (ref 0.61–1.24)
GFR, Estimated: >60 mL/min (ref >60)
Glucose, Bld: 81 mg/dL (ref 70–99)
Potassium: 3.7 mmol/L (ref 3.5–5.1)
Sodium: 133 mmol/L — ABNORMAL LOW (ref 135–145)

## 2022-06-04 LAB — PHOSPHORUS: Phosphorus: 2.5 mg/dL (ref 2.5–4.6)

## 2022-06-04 MED ORDER — PHENOBARBITAL 32.4 MG PO TABS: 32.4000 mg | ORAL_TABLET | Freq: Three times a day (TID) | ORAL | Status: DC

## 2022-06-04 MED ORDER — PHENOBARBITAL SODIUM 130 MG/ML IJ SOLN: 97.5000 mg | Freq: Three times a day (TID) | INTRAMUSCULAR | Status: DC

## 2022-06-04 MED ORDER — PHENOBARBITAL 32.4 MG PO TABS
97.2000 mg | ORAL_TABLET | Freq: Three times a day (TID) | ORAL | Status: AC
  Administered 2022-06-04 – 2022-06-06 (×6): 97.2 mg via ORAL
  Filled 2022-06-04 (×6): qty 3

## 2022-06-04 MED ORDER — PHENOBARBITAL SODIUM 65 MG/ML IJ SOLN: 65.0000 mg | Freq: Three times a day (TID) | INTRAMUSCULAR | Status: DC

## 2022-06-04 MED ORDER — PHENOBARBITAL 32.4 MG PO TABS
64.8000 mg | ORAL_TABLET | Freq: Three times a day (TID) | ORAL | Status: DC
  Administered 2022-06-06 – 2022-06-07 (×4): 64.8 mg via ORAL
  Filled 2022-06-04 (×4): qty 2

## 2022-06-04 MED ORDER — AMLODIPINE BESYLATE 5 MG PO TABS
5.0000 mg | ORAL_TABLET | Freq: Every day | ORAL | Status: DC
  Administered 2022-06-04 – 2022-06-07 (×4): 5 mg via ORAL
  Filled 2022-06-04 (×4): qty 1

## 2022-06-04 MED ORDER — PHENOBARBITAL SODIUM 65 MG/ML IJ SOLN: 32.5000 mg | Freq: Three times a day (TID) | INTRAMUSCULAR | Status: DC

## 2022-06-04 MED ORDER — LABETALOL HCL 5 MG/ML IV SOLN: 20.0000 mg | INTRAVENOUS | Status: DC | PRN

## 2022-06-04 MED ORDER — MAGNESIUM SULFATE 2 GM/50ML IV SOLN
2.0000 g | Freq: Once | INTRAVENOUS | Status: AC
  Administered 2022-06-04: 2 g via INTRAVENOUS
  Filled 2022-06-04: qty 50
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
## 2022-06-04 NOTE — Evaluation (Signed)
Occupational Therapy Evaluation Patient Details Name: [name] MRN: [id number] DOB: [birth year] Today's Date: 06/04/2022   History of Present Illness Pt admitted from home with acute respiratory failure 2* ETOH intoxication and polysubstance OD.  Pt with hx of CAD, PVD, breast CA s/p mastectomy, bipolar, schizophrenia, seizure disorder, and PTSD   Clinical Impression   Pt was functioning independently prior to admission. Demonstrates some mild standing balance impairment and coulf benefit from further cognitive screening. Pt overall requires up to min assist for ambulation without AD and set up to min assist for ADLs. Plan is for pt to go to [facility] upon release from acute care hospital.      Recommendations for follow up therapy are one component of a multi-disciplinary discharge planning process, led by the attending physician.  Recommendations may be updated based on patient status, additional functional criteria and insurance authorization.   Follow Up Recommendations  No OT follow up     Assistance Recommended at Discharge Intermittent Supervision/Assistance  Patient can return home with the following A little help with walking and/or transfers;A little help with bathing/dressing/bathroom;Assist for transportation;Help with stairs or ramp for entrance    Functional Status Assessment  Patient has had a recent decline in their functional status and demonstrates the ability to make significant improvements in function in a reasonable and predictable amount of time.  Equipment Recommendations  None recommended by OT    Recommendations for Other Services       Precautions / Restrictions Precautions Precautions: Fall Restrictions Weight Bearing Restrictions: No      Mobility Bed Mobility Overal bed mobility: Needs Assistance Bed Mobility: Supine to Sit     Supine to sit: Supervision          Transfers Overall transfer level: Needs assistance Equipment used:  None Transfers: Sit to/from Stand Sit to Stand: Min guard                  Balance Overall balance assessment: Mild deficits observed, not formally tested                                         ADL either performed or assessed with clinical judgement   ADL Overall ADL's : Needs assistance/impaired Eating/Feeding: Independent   Grooming: Min guard;Standing;Wash/dry hands   Upper Body Bathing: Set up;Sitting   Lower Body Bathing: Min guard;Sit to/from stand   Upper Body Dressing : Set up;Sitting   Lower Body Dressing: Min guard;Sit to/from stand   Toilet Transfer: Min guard;Ambulation;BSC/3in1   Toileting- Clothing Manipulation and Hygiene: Minimal assistance;Sit to/from stand       Functional mobility during ADLs: Minimal assistance       Vision Baseline Vision/History: 0 No visual deficits Patient Visual Report: No change from baseline       Perception     Praxis      Pertinent Vitals/Pain Pain Assessment Pain Assessment: No/denies pain     Hand Dominance Right   Extremity/Trunk Assessment Upper Extremity Assessment Upper Extremity Assessment: Overall WFL for tasks assessed   Lower Extremity Assessment Lower Extremity Assessment: Defer to PT evaluation   Cervical / Trunk Assessment Cervical / Trunk Assessment: Normal   Communication Communication Communication: No difficulties   Cognition Arousal/Alertness: Awake/alert Behavior During Therapy: WFL for tasks assessed/performed Overall Cognitive Status: No family/caregiver present to determine baseline cognitive functioning  General Comments: pt with impaired problem solving, needs further cognitive screening     General Comments       Exercises     Shoulder Instructions      Home Living Family/patient expects to be discharged to:: Private residence Living Arrangements: Non-relatives/Friends (roommate) Available Help at  Discharge: Friend(s);Available PRN/intermittently Type of Home: Apartment Home Access: Level entry     Home Layout: One level     Bathroom Shower/Tub: Teacher, early years/pre: Standard     Home Equipment: None          Prior Functioning/Environment Prior Level of Function : Independent/Modified Independent                        OT Problem List: Impaired balance (sitting and/or standing);Decreased cognition;Decreased safety awareness      OT Treatment/Interventions: Self-care/ADL training;DME and/or AE instruction;Therapeutic activities;Patient/family education;Balance training    OT Goals(Current goals can be found in the care plan section) Acute Rehab OT Goals OT Goal Formulation: With patient Time For Goal Achievement: 06/18/22 Potential to Achieve Goals: Good ADL Goals Pt Will Transfer to Toilet: Independently;ambulating;regular height toilet Pt Will Perform Toileting - Clothing Manipulation and hygiene: Independently;sit to/from stand Pt Will Perform Tub/Shower Transfer: Independently;ambulating;Tub transfer Additional ADL Goal #1: Pt will complete basic ADLs independently. Additional ADL Goal #2: Pt will participate in formal cognitive screening.  OT Frequency: Min 2X/week    Co-evaluation              AM-PAC OT "6 Clicks" Daily Activity     Outcome Measure Help from another person eating meals?: None Help from another person taking care of personal grooming?: A Little Help from another person toileting, which includes using toliet, bedpan, or urinal?: A Little Help from another person bathing (including washing, rinsing, drying)?: A Little Help from another person to put on and taking off regular upper body clothing?: A Little Help from another person to put on and taking off regular lower body clothing?: A Little 6 Click Score: 19   End of Session    Activity Tolerance: Patient tolerated treatment well Patient left: in chair;with  call bell/phone within reach;with chair alarm set;with nursing/sitter in room  OT Visit Diagnosis: Unsteadiness on feet (R26.81);Other abnormalities of gait and mobility (R26.89);Other symptoms and signs involving cognitive function                Time: 8299-3716 OT Time Calculation (min): 20 min Charges:  OT General Charges $OT Visit: 1 Visit OT Evaluation $OT Eval Low Complexity: [place], OTR/L Acute Rehabilitation Services Office: [phone]  Malka So 06/04/2022, 2:04 PM
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
## 2022-06-04 NOTE — Evaluation (Signed)
Physical Therapy Evaluation Patient Details Name: [name] MRN: [id number] DOB: April 20, 1981 Today's Date: 06/04/2022  History of Present Illness  Pt admitted from home with acute respiratory failure 2* ETOH intoxication and polysubstance OD.  Pt with hx of CAD, PVD, breast CA s/p mastectomy, bipolar, schizophrenia, seizure disorder, and PTSD  Clinical Impression  Pt admitted as above and presenting with functional mobility limitations 2* mild balance deficits and decreased endurance.  Pt would benefit from follow up PT with acute stay and should progress to being independent with mobility prior to dc from hospital.  Pts current plan is follow up care at psychiatric hospital.     Recommendations for follow up therapy are one component of a multi-disciplinary discharge planning process, led by the attending physician.  Recommendations may be updated based on patient status, additional functional criteria and insurance authorization.  Follow Up Recommendations Other (comment) (psychiatric hospital)      Assistance Recommended at Discharge None  Patient can return home with the following       Equipment Recommendations None recommended by PT  Recommendations for Other Services       Functional Status Assessment Patient has had a recent decline in their functional status and demonstrates the ability to make significant improvements in function in a reasonable and predictable amount of time.     Precautions / Restrictions Precautions Precautions: Fall Restrictions Weight Bearing Restrictions: No      Mobility  Bed Mobility Overal bed mobility: Needs Assistance Bed Mobility: Supine to Sit     Supine to sit: Supervision     General bed mobility comments: sup for safety only    Transfers Overall transfer level: Needs assistance Equipment used: None Transfers: Sit to/from Stand Sit to Stand: Min guard           General transfer comment: steady assist only     Ambulation/Gait Ambulation/Gait assistance: Min assist, Min guard Gait Distance (Feet): 350 Feet Assistive device: None Gait Pattern/deviations: Step-through pattern, Decreased step length - right, Decreased step length - left, Shuffle, Wide base of support, Staggering right       General Gait Details: General instability with several episodes mild balance loss requiring steady assist to recover  Stairs            Wheelchair Mobility    Modified Rankin (Stroke Patients Only)       Balance Overall balance assessment: Mild deficits observed, not formally tested                                           Pertinent Vitals/Pain Pain Assessment Pain Assessment: No/denies pain    Home Living Family/patient expects to be discharged to:: Private residence Living Arrangements: Alone Available Help at Discharge: Friend(s);Available PRN/intermittently Type of Home: Apartment Home Access: Level entry       Home Layout: One level        Prior Function Prior Level of Function : Independent/Modified Independent                     Hand Dominance        Extremity/Trunk Assessment   Upper Extremity Assessment Upper Extremity Assessment: Defer to OT evaluation    Lower Extremity Assessment Lower Extremity Assessment: Overall WFL for tasks assessed    Cervical / Trunk Assessment Cervical / Trunk Assessment: Normal  Communication   Communication: No difficulties  Cognition Arousal/Alertness: Awake/alert Behavior During Therapy: WFL for tasks assessed/performed Overall Cognitive Status: Within Functional Limits for tasks assessed                                          General Comments      Exercises     Assessment/Plan    PT Assessment Patient needs continued PT services  PT Problem List Decreased balance;Decreased mobility;Decreased activity tolerance       PT Treatment Interventions DME instruction;Gait  training;Stair training;Functional mobility training;Therapeutic activities;Therapeutic exercise;Balance training;Patient/family education    PT Goals (Current goals can be found in the Care Plan section)  Acute Rehab PT Goals Patient Stated Goal: Regain IND PT Goal Formulation: With patient Time For Goal Achievement: 06/24/22 Potential to Achieve Goals: Good    Frequency Min 3X/week     Co-evaluation               AM-PAC PT "6 Clicks" Mobility  Outcome Measure Help needed turning from your back to your side while in a flat bed without using bedrails?: None Help needed moving from lying on your back to sitting on the side of a flat bed without using bedrails?: None Help needed moving to and from a bed to a chair (including a wheelchair)?: A Little Help needed standing up from a chair using your arms (e.g., wheelchair or bedside chair)?: A Little Help needed to walk in hospital room?: A Little Help needed climbing 3-5 steps with a railing? : A Little 6 Click Score: 20    End of Session Equipment Utilized During Treatment: Gait belt Activity Tolerance: Patient tolerated treatment well Patient left: in chair;with call bell/phone within reach;with nursing/sitter in room Nurse Communication: Mobility status PT Visit Diagnosis: Difficulty in walking, not elsewhere classified (R26.2)    Time: 9381-0175 PT Time Calculation (min) (ACUTE ONLY): 20 min   Charges:   PT Evaluation $PT Eval Low Complexity: [place] Pager [phone] Office [phone]   Mekenna Finau 06/04/2022, 1:36 PM

## 2022-06-04 NOTE — TOC Progression Note (Signed)
Transition of Care [facility]) - Progression Note    Patient Details  Name: [name] MRN: [id number] Date of Birth: May 16, 1981  Transition [facility] CM/SW Contact  Henrietta Dine, RN Phone Number: 06/04/2022, 12:55 PM  Clinical Narrative:    Notified pt will d/c to IP psych; demographics, H&P, and pschy notes faxed out to facilities; awaiting response.   Expected Discharge Plan: Psychiatric Hospital Barriers to Discharge: Continued Medical Work up  Expected Discharge Plan and Services Expected Discharge Plan: [facility]   Discharge Planning Services: CM Consult   Living arrangements for the past 2 months: Single Family Home                                       Social Determinants of Health (SDOH) Interventions    Readmission Risk Interventions     No data to display

## 2022-06-05 DIAGNOSIS — T1491XA Suicide attempt, initial encounter: Secondary | ICD-10-CM | POA: Diagnosis not present

## 2022-06-05 DIAGNOSIS — G40909 Epilepsy, unspecified, not intractable, without status epilepticus: Secondary | ICD-10-CM

## 2022-06-05 DIAGNOSIS — F4310 Post-traumatic stress disorder, unspecified: Secondary | ICD-10-CM | POA: Diagnosis not present

## 2022-06-05 LAB — BASIC METABOLIC PANEL
Anion gap: 7 (ref 5–15)
BUN: <5 mg/dL — ABNORMAL LOW (ref 6–20)
CO2: 25 mmol/L (ref 22–32)
Calcium: 8.8 mg/dL — ABNORMAL LOW (ref 8.9–10.3)
Chloride: 103 mmol/L (ref 98–111)
Creatinine, Ser: 0.85 mg/dL (ref 0.61–1.24)
GFR, Estimated: >60 mL/min (ref >60)
Glucose, Bld: 82 mg/dL (ref 70–99)
Potassium: 3.7 mmol/L (ref 3.5–5.1)
Sodium: 135 mmol/L (ref 135–145)

## 2022-06-05 LAB — MAGNESIUM: Magnesium: 2 mg/dL (ref 1.7–2.4)

## 2022-06-05 MED ORDER — MAGNESIUM OXIDE -MG SUPPLEMENT 400 (240 MG) MG PO TABS
400.0000 mg | ORAL_TABLET | Freq: Two times a day (BID) | ORAL | Status: DC
  Administered 2022-06-05 – 2022-06-07 (×5): 400 mg via ORAL
  Filled 2022-06-05 (×5): qty 1
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
## 2022-06-06 DIAGNOSIS — T50902D Poisoning by unspecified drugs, medicaments and biological substances, intentional self-harm, subsequent encounter: Secondary | ICD-10-CM | POA: Diagnosis not present

## 2022-06-06 LAB — PHOSPHORUS: Phosphorus: 3.8 mg/dL (ref 2.5–4.6)

## 2022-06-06 MED ORDER — GUAIFENESIN-DM 100-10 MG/5ML PO SYRP
5.0000 mL | ORAL_SOLUTION | Freq: Once | ORAL | Status: AC | PRN
  Administered 2022-06-06: 5 mL via ORAL
  Filled 2022-06-06: qty 10

## 2022-06-06 MED ORDER — MELATONIN 5 MG PO TABS
5.0000 mg | ORAL_TABLET | Freq: Once | ORAL | Status: AC
  Administered 2022-06-06: 5 mg via ORAL
  Filled 2022-06-06: qty 1

## 2022-06-06 MED ORDER — VITAMIN B-1 100 MG PO TABS: 100.0000 mg | ORAL_TABLET | Freq: Every day | ORAL | Status: DC

## 2022-06-06 MED ORDER — ALPRAZOLAM 0.25 MG PO TABS
0.2500 mg | ORAL_TABLET | Freq: Three times a day (TID) | ORAL | Status: DC | PRN
  Administered 2022-06-06 – 2022-06-07 (×3): 0.25 mg via ORAL
  Filled 2022-06-06 (×3): qty 1

## 2022-06-06 MED ORDER — FOLIC ACID 1 MG PO TABS: 1.0000 mg | ORAL_TABLET | Freq: Every day | ORAL | Status: DC

## 2022-06-06 MED ORDER — MAGNESIUM OXIDE -MG SUPPLEMENT 400 (240 MG) MG PO TABS: 400.0000 mg | ORAL_TABLET | Freq: Two times a day (BID) | ORAL | Status: DC
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
## 2022-06-07 DIAGNOSIS — T50902A Poisoning by unspecified drugs, medicaments and biological substances, intentional self-harm, initial encounter: Secondary | ICD-10-CM | POA: Diagnosis not present

## 2022-06-07 LAB — RESP PANEL BY RT-PCR (FLU A&B, COVID) ARPGX2
Influenza A by PCR: NEGATIVE
Influenza B by PCR: NEGATIVE
SARS Coronavirus 2 by RT PCR: NEGATIVE

## 2022-06-07 MED ORDER — DIPHENHYDRAMINE HCL 25 MG PO CAPS
25.0000 mg | ORAL_CAPSULE | Freq: Four times a day (QID) | ORAL | Status: DC | PRN
  Administered 2022-06-07: 25 mg via ORAL
  Filled 2022-06-07: qty 1

## 2022-06-07 MED ORDER — NICOTINE 7 MG/24HR TD PT24
7.0000 mg | MEDICATED_PATCH | Freq: Every day | TRANSDERMAL | Status: AC
  Administered 2022-06-07: 7 mg via TRANSDERMAL
  Filled 2022-06-07: qty 1

## 2022-06-07 MED ORDER — IBUPROFEN 200 MG PO TABS
200.0000 mg | ORAL_TABLET | Freq: Once | ORAL | Status: AC
  Administered 2022-06-07: 200 mg via ORAL
  Filled 2022-06-07: qty 1

## 2022-06-07 MED ORDER — MELATONIN 3 MG PO TABS
3.0000 mg | ORAL_TABLET | Freq: Once | ORAL | Status: AC
  Administered 2022-06-07: 3 mg via ORAL
  Filled 2022-06-07: qty 1

## 2022-06-07 NOTE — Progress Notes (Signed)
Physical Therapy Treatment Patient Details Name: [name] MRN: [id number] DOB: 06-13-1981 Today's Date: 06/07/2022   History of Present Illness Pt admitted from home with acute respiratory failure 2* ETOH intoxication and polysubstance OD.  Pt with hx of CAD, PVD, breast CA s/p mastectomy, bipolar, schizophrenia, seizure disorder, and PTSD    PT Comments    Patient was found in bed with sitter and family member present. Pt denied any pain. Required supervision for bed mobility for safety; min guard for transfers to steady during initial standing; Min guard/min assist needed for ambulation for safety. LOB x 1 to R lateral side but quickly self recovered and did well afterwards, no c/o of dizziness. Increased time needed. Patient plans on discharging to inpatient psychiatry.   Recommendations for follow up therapy are one component of a multi-disciplinary discharge planning process, led by the attending physician.  Recommendations may be updated based on patient status, additional functional criteria and insurance authorization.  Follow Up Recommendations  Other (comment)     Assistance Recommended at Discharge None  Patient can return home with the following     Equipment Recommendations  None recommended by PT    Recommendations for Other Services       Precautions / Restrictions Precautions Precautions: Fall Restrictions Weight Bearing Restrictions: No     Mobility  Bed Mobility Overal bed mobility: Needs Assistance Bed Mobility: Supine to Sit     Supine to sit: Supervision     General bed mobility comments: sup for safety only    Transfers Overall transfer level: Needs assistance Equipment used: None Transfers: Sit to/from Stand Sit to Stand: Min guard           General transfer comment: steady assist only    Ambulation/Gait Ambulation/Gait assistance: Min guard, Min assist Gait Distance (Feet): 200 Feet Assistive device: None Gait  Pattern/deviations: Step-through pattern, Decreased step length - right, Decreased step length - left, Shuffle Gait velocity: decr     General Gait Details: LOB x 1 to R lateral side but quickly self recovered and did well afterwards. Increased time needed. Min guard/min assist needed for safety.   Stairs             Wheelchair Mobility    Modified Rankin (Stroke Patients Only)       Balance                                            Cognition Arousal/Alertness: Awake/alert Behavior During Therapy: WFL for tasks assessed/performed Overall Cognitive Status: No family/caregiver present to determine baseline cognitive functioning                                 General Comments: pt with impaired problem solving, needs further cognitive screening        Exercises      General Comments        Pertinent Vitals/Pain Pain Assessment Pain Assessment: No/denies pain    Home Living                          Prior Function            PT Goals (current goals can now be found in the care plan section) Acute Rehab PT Goals Patient Stated Goal: Regain IND  PT Goal Formulation: With patient Time For Goal Achievement: 06/24/22 Progress towards PT goals: Progressing toward goals    Frequency    Min 3X/week      PT Plan      Co-evaluation              AM-PAC PT "6 Clicks" Mobility   Outcome Measure  Help needed turning from your back to your side while in a flat bed without using bedrails?: None Help needed moving from lying on your back to sitting on the side of a flat bed without using bedrails?: None Help needed moving to and from a bed to a chair (including a wheelchair)?: A Little Help needed standing up from a chair using your arms (e.g., wheelchair or bedside chair)?: A Little Help needed to walk in hospital room?: A Little Help needed climbing 3-5 steps with a railing? : A Little 6 Click Score: 20     End of Session Equipment Utilized During Treatment: Gait belt Activity Tolerance: Patient tolerated treatment well Patient left: with nursing/sitter in room;with call bell/phone within reach;in chair Nurse Communication: Mobility status PT Visit Diagnosis: Difficulty in walking, not elsewhere classified (R26.2)     Time: 1683-7290 PT Time Calculation (min) (ACUTE ONLY): 9 min  Charges:  $Gait Training: 8-22 mins                        [name] 06/07/2022, 1:09 PM

## 2022-06-07 NOTE — Progress Notes (Signed)
CSW fax negative COVID results to [place] [facility] [phone].   Benjaman Kindler, MSW, [facility] 06/07/2022 4:57 PM

## 2022-06-07 NOTE — Consult Note (Signed)
Patient seen and reassessed briefly, in order to identify any acute concerns prior to transfer to inpatient psychiatric facility.  Patient was glad to hear of admission, as he remains future oriented to seek appropriate psychiatric resources and services.  He denies having any further questions, citing he is familiar with voluntary process and is willing to go inpatient at this time.  Please see is that the information provided by Case manager and social worker.  Care team communication has been made in chart updated to reflect information listed below.  COVID order was placed in pending at the time of this evaluation.     Pt was accepted to [facility] PENDING Negative COVID; 5th floor [facility]   Pt meets inpatient criteria per Sheran Fava, FNP,   Attending Physician will be Dr. Nestor Lewandowsky   Nursing report [phone]   Any further questions contact Frederich Cha, LCSW at [phone]    Pt can arrive after PENDING COVID is results as negative   Care Team notified: Sheran Fava, FNP, Sharyn Blitz, RN, Shelly Coss, MD, Dessa Phi, RN

## 2022-06-07 NOTE — Plan of Care (Signed)
  Problem: Coping: Goal: Level of anxiety will decrease Outcome: Progressing   Problem: Education: Goal: Knowledge of General Education information will improve Description: Including pain rating scale, medication(s)/side effects and non-pharmacologic comfort measures Outcome: Adequate for Discharge   Problem: Health Behavior/Discharge Planning: Goal: Ability to manage health-related needs will improve Outcome: Adequate for Discharge   Problem: Clinical Measurements: Goal: Ability to maintain clinical measurements within normal limits will improve Outcome: Adequate for Discharge Goal: Will remain free from infection Outcome: Adequate for Discharge Goal: Diagnostic test results will improve Outcome: Adequate for Discharge Goal: Respiratory complications will improve Outcome: Adequate for Discharge Goal: Cardiovascular complication will be avoided Outcome: Adequate for Discharge   Problem: Activity: Goal: Risk for activity intolerance will decrease Outcome: Adequate for Discharge   Problem: Nutrition: Goal: Adequate nutrition will be maintained Outcome: Adequate for Discharge   Problem: Elimination: Goal: Will not experience complications related to bowel motility Outcome: Adequate for Discharge Goal: Will not experience complications related to urinary retention Outcome: Adequate for Discharge   Problem: Pain Managment: Goal: General experience of comfort will improve Outcome: Adequate for Discharge   Problem: Safety: Goal: Ability to remain free from injury will improve Outcome: Adequate for Discharge   Problem: Skin Integrity: Goal: Risk for impaired skin integrity will decrease Outcome: Adequate for Discharge

## 2022-06-07 NOTE — Progress Notes (Signed)
Patient seen and examined at the bedside today.  Hemodynamically stable.  Alert and oriented, not in withdrawal.  Complain of some itching this morning so Benadryl ordered.  He remains medically stable for discharge to inpatient psychiatry as soon as possible.  No change in the medical management

## 2022-06-07 NOTE — Progress Notes (Addendum)
Pt was accepted to [facility] PENDING Negative COVID; 5th floor [facility] Center  Pt meets inpatient criteria per Sheran Fava, FNP  Attending Physician will be Dr. Nestor Lewandowsky  Nursing report [phone]  Any further questions contact Frederich Cha, LCSW at [phone]   Pt can arrive after PENDING COVID is results as negative  Care Team notified: Sheran Fava, FNP, Sharyn Blitz, RN, Shelly Coss, MD, Dessa Phi, RN  Nadara Mode, [place] 06/07/2022 @ 12:52 PM

## 2022-06-07 NOTE — Progress Notes (Signed)
   06/07/22 0723  Oxygen Therapy  O2 Device Room Air  Pain Assessment  Pain Scale 0-10  Pain Score 7  Pain Type Acute pain  Pain Location Abdomen  Pain Descriptors / Indicators Cramping  Pain Onset On-going  Patients Stated Pain Goal 0  Pain Intervention(s) MD notified (Comment) (Dr. Tawanna Solo)  Multiple Pain Sites No  Complaints & Interventions  Complains of Itching  Provider Notification  Provider Name/Title Dr. Tawanna Solo  Date Provider Notified 06/07/22  Time Provider Notified [phone]  Method of Notification Page  Notification Reason Requested by patient/family  Provider response See new orders  Date of Provider Response 06/07/22  Time of Provider Response [id number]

## 2022-06-07 NOTE — TOC Progression Note (Addendum)
Transition of Care [facility]) - Progression Note    Patient Details  Name: [name] MRN: [id number] Date of Birth: 12-Jun-1981  Transition of Care [facility]) CM/SW Contact  Bart Ashford, Juliann Pulse, RN Phone Number: 06/07/2022, 10:41 AM  Clinical Narrative:Denied or waiting on response for IP psych facilities-[facility], [facility], [facility];faxed to [facility].Awaiting acceptance from any facility for IP Psych. MD aware.  -12:37p-awaiting [facility] letter for transport,covid results prior safe transport.   -2:42p-Waiver for safe transport, & waiver for [facility] signed, & in packet, awaiting covid neg results-nsg to fax results to [facility]#[phone]. Nsg report tel#[phone]. Safe transport to be called tel#[phone]. No further CM needs. -nsg has faxed covid results.safe transport called.No further CM needs.    Expected Discharge Plan: Psychiatric Hospital Barriers to Discharge: Psych Bed not available  Expected Discharge Plan and Services Expected Discharge Plan: [facility]   Discharge Planning Services: CM Consult   Living arrangements for the past 2 months: Single Family Home Expected Discharge Date: 06/06/22                                     Social Determinants of Health (SDOH) Interventions    Readmission Risk Interventions     No data to display

## 2022-06-08 LAB — VITAMIN B1: Vitamin B1 (Thiamine): 94 nmol/L (ref 66.5–200.0)

## 2022-06-14 ENCOUNTER — Other Ambulatory Visit: Payer: Self-pay | Admitting: Internal Medicine

## 2022-06-25 ENCOUNTER — Ambulatory Visit (HOSPITAL_COMMUNITY)
Admission: RE | Admit: 2022-06-25 | Discharge: 2022-06-25 | Disposition: A | Discharge status: Home / Self Care | Payer: Commercial Managed Care - HMO | Source: Ambulatory Visit | Attending: Internal Medicine | Admitting: Internal Medicine

## 2022-06-25 ENCOUNTER — Ambulatory Visit (INDEPENDENT_AMBULATORY_CARE_PROVIDER_SITE_OTHER): Payer: Commercial Managed Care - HMO | Admitting: Student

## 2022-06-25 VITALS — BP 127/89 | HR 112 | Temp 97.9°F | Ht 66.0 in | Wt 162.9 lb

## 2022-06-25 DIAGNOSIS — I10 Essential (primary) hypertension: Secondary | ICD-10-CM

## 2022-06-25 DIAGNOSIS — T50902D Poisoning by unspecified drugs, medicaments and biological substances, intentional self-harm, subsequent encounter: Secondary | ICD-10-CM

## 2022-06-25 DIAGNOSIS — F1090 Alcohol use, unspecified, uncomplicated: Secondary | ICD-10-CM

## 2022-06-25 DIAGNOSIS — F17210 Nicotine dependence, cigarettes, uncomplicated: Secondary | ICD-10-CM

## 2022-06-25 DIAGNOSIS — J9601 Acute respiratory failure with hypoxia: Secondary | ICD-10-CM

## 2022-06-25 DIAGNOSIS — R9431 Abnormal electrocardiogram [ECG] [EKG]: Secondary | ICD-10-CM | POA: Diagnosis not present

## 2022-06-25 DIAGNOSIS — F3164 Bipolar disorder, current episode mixed, severe, with psychotic features: Secondary | ICD-10-CM | POA: Diagnosis not present

## 2022-06-25 DIAGNOSIS — K7460 Unspecified cirrhosis of liver: Secondary | ICD-10-CM | POA: Diagnosis not present

## 2022-06-25 DIAGNOSIS — G40909 Epilepsy, unspecified, not intractable, without status epilepticus: Secondary | ICD-10-CM

## 2022-06-25 DIAGNOSIS — E785 Hyperlipidemia, unspecified: Secondary | ICD-10-CM

## 2022-06-25 MED ORDER — LAMOTRIGINE 25 MG PO TABS: 25.0000 mg | ORAL_TABLET | Freq: Every day | ORAL | 0 refills | Status: DC

## 2022-06-25 NOTE — Patient Instructions (Addendum)
It was a pleasure seeing you in clinic today  Please follow up with behavioral health regarding your medications including trazodone and ativan  We will check labs today and I will call with results

## 2022-06-26 DIAGNOSIS — K7460 Unspecified cirrhosis of liver: Secondary | ICD-10-CM | POA: Insufficient documentation

## 2022-06-26 DIAGNOSIS — R9431 Abnormal electrocardiogram [ECG] [EKG]: Secondary | ICD-10-CM | POA: Insufficient documentation

## 2022-06-26 LAB — MAGNESIUM: Magnesium: 2.3 mg/dL (ref 1.6–2.3)

## 2022-06-26 LAB — HEPATITIS B SURFACE ANTIBODY,QUALITATIVE: Hep B Surface Ab, Qual: REACTIVE

## 2022-06-26 LAB — HEPATITIS B SURFACE ANTIGEN: Hepatitis B Surface Ag: NEGATIVE

## 2022-06-26 LAB — HEPATITIS C ANTIBODY: Hep C Virus Ab: NONREACTIVE

## 2022-06-26 LAB — HEPATITIS B CORE ANTIBODY, TOTAL: Hep B Core Total Ab: NEGATIVE
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
## 2022-06-26 NOTE — Assessment & Plan Note (Signed)
QT prolongation during admission. On aripiprazole. Mg low during admission and now on oral magnesium. Repeat EKG today with improvement in QT to 477. Will repeat magnesium.

## 2022-06-26 NOTE — Assessment & Plan Note (Signed)
Patient here for HFU after intentional overdose with alcohol. Also endorses history of heavy alcohol use. Has not drank since discharge from [facility]. Reports mood is good. No active or passive SI. Is taking acamprosate 666 mg three times a day and motivated to stop drinking.
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
## 2022-06-26 NOTE — Assessment & Plan Note (Signed)
Currently on atorvastatin 40 mg daily. Was not taking crestor previously. Total cholesterol elevated to 358, HDL 73, LDL 268, triglycerides 85 on 06/08/2022. Will repeat lipid panel in 6-8 weeks.

## 2022-06-26 NOTE — Assessment & Plan Note (Signed)
Recent admission for suicide attempt required ICU stay and discharged to [facility]. Reports mood is doing well today. No long with SI. Was discharged on ability 10 mg daily, gabapentin 300 mg daily, trazodone 50 mg nightly, buspirone 10 mg three time daily,  and ativan '1mg'$  q6 prn. Reports she is out of ativan and would like to be on valium and trazodone 300 mg daily. Patient states has not followed up with [facility]. Previously saw [place] at [facility] in 2022 but has not been seen since. Will refer his back to [facility] for management of psychiatric medications.

## 2022-06-26 NOTE — Assessment & Plan Note (Signed)
Charted history of liver cirrhosis. Low platelets during hospitalization likely in set setting of acute illness. HV negative in 04/2022. Likely due to history of alcohol use. Will check hepatitis B and C.

## 2022-06-26 NOTE — Assessment & Plan Note (Signed)
BP well controlled on amlodipine 5 mg daily. Will continue this.

## 2022-06-26 NOTE — Assessment & Plan Note (Deleted)
Patient here for HFU after intentional overdose with alcohol. Also endorses history of heavy alcohol use. Has not drank since discharge from [facility]. Reports mood is good. No active or passive SI. Is taking acamprosate 666 mg three times a day and motivated to stop drinking.

## 2022-06-28 NOTE — Progress Notes (Signed)
Internal Medicine Clinic Attending ? ?Case discussed with Dr. Liang  At the time of the visit.  We reviewed the resident?s history and exam and pertinent patient test results.  I agree with the assessment, diagnosis, and plan of care documented in the resident?s note. ? ?

## 2022-06-30 ENCOUNTER — Encounter: Payer: Self-pay | Admitting: Student

## 2022-07-07 ENCOUNTER — Emergency Department (HOSPITAL_COMMUNITY)
Admission: EM | Admit: 2022-07-07 | Discharge: 2022-07-07 | Discharge status: Against Medical Advice | Payer: Commercial Managed Care - HMO

## 2022-07-08 ENCOUNTER — Other Ambulatory Visit: Payer: Self-pay

## 2022-07-08 ENCOUNTER — Emergency Department (HOSPITAL_COMMUNITY): Payer: Commercial Managed Care - HMO

## 2022-07-08 ENCOUNTER — Emergency Department (HOSPITAL_COMMUNITY)
Admission: EM | Admit: 2022-07-08 | Discharge: 2022-07-08 | Disposition: A | Discharge status: Home / Self Care | Payer: Commercial Managed Care - HMO | Attending: Emergency Medicine | Admitting: Emergency Medicine

## 2022-07-08 DIAGNOSIS — D696 Thrombocytopenia, unspecified: Secondary | ICD-10-CM | POA: Diagnosis not present

## 2022-07-08 DIAGNOSIS — F10120 Alcohol abuse with intoxication, uncomplicated: Secondary | ICD-10-CM | POA: Insufficient documentation

## 2022-07-08 DIAGNOSIS — R569 Unspecified convulsions: Secondary | ICD-10-CM | POA: Insufficient documentation

## 2022-07-08 DIAGNOSIS — R000 Tachycardia, unspecified: Secondary | ICD-10-CM | POA: Insufficient documentation

## 2022-07-08 DIAGNOSIS — S0993XA Unspecified injury of face, initial encounter: Secondary | ICD-10-CM | POA: Diagnosis present

## 2022-07-08 DIAGNOSIS — Z79899 Other long term (current) drug therapy: Secondary | ICD-10-CM | POA: Diagnosis not present

## 2022-07-08 DIAGNOSIS — S0010XA Contusion of unspecified eyelid and periocular area, initial encounter: Secondary | ICD-10-CM | POA: Insufficient documentation

## 2022-07-08 DIAGNOSIS — I10 Essential (primary) hypertension: Secondary | ICD-10-CM | POA: Diagnosis not present

## 2022-07-08 DIAGNOSIS — F10920 Alcohol use, unspecified with intoxication, uncomplicated: Secondary | ICD-10-CM

## 2022-07-08 LAB — BASIC METABOLIC PANEL
Anion gap: 13 (ref 5–15)
BUN: <5 mg/dL — ABNORMAL LOW (ref 6–20)
CO2: 26 mmol/L (ref 22–32)
Calcium: 8.7 mg/dL — ABNORMAL LOW (ref 8.9–10.3)
Chloride: 99 mmol/L (ref 98–111)
Creatinine, Ser: 0.68 mg/dL (ref 0.61–1.24)
GFR, Estimated: >60 mL/min (ref >60)
Glucose, Bld: 88 mg/dL (ref 70–99)
Potassium: 3.6 mmol/L (ref 3.5–5.1)
Sodium: 138 mmol/L (ref 135–145)

## 2022-07-08 LAB — CBC
HCT: 46.4 % (ref 39.0–52.0)
Hemoglobin: 16.1 g/dL (ref 13.0–17.0)
MCH: 30.7 pg (ref 26.0–34.0)
MCHC: 34.7 g/dL (ref 30.0–36.0)
MCV: 88.4 fL (ref 80.0–100.0)
Platelets: 69 10*3/uL — ABNORMAL LOW (ref 150–400)
RBC: 5.25 MIL/uL (ref 4.22–5.81)
RDW: 13.3 % (ref 11.5–15.5)
WBC: 3.8 10*3/uL — ABNORMAL LOW (ref 4.0–10.5)
nRBC: 0 % (ref 0.0–0.2)

## 2022-07-08 LAB — ETHANOL: Alcohol, Ethyl (B): 297 mg/dL — ABNORMAL HIGH (ref <10)

## 2022-07-08 MED ORDER — CHLORDIAZEPOXIDE HCL 25 MG PO CAPS: ORAL_CAPSULE | ORAL | 0 refills | Status: DC

## 2022-07-08 MED ORDER — LACTATED RINGERS IV BOLUS
2000.0000 mL | Freq: Once | INTRAVENOUS | Status: AC
  Administered 2022-07-08: 2000 mL via INTRAVENOUS
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
## 2022-07-08 NOTE — ED Notes (Signed)
Unable to contact legal Guardian. RN leave a Designer, television/film set.
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
## 2022-07-08 NOTE — ED Notes (Signed)
Patient report drinking hand sanitizer. EDP informed. Patient still ok to leave.
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
## 2022-07-09 ENCOUNTER — Other Ambulatory Visit: Payer: Self-pay

## 2022-07-09 ENCOUNTER — Ambulatory Visit (HOSPITAL_COMMUNITY)
Admission: AD | Admit: 2022-07-09 | Discharge: 2022-07-09 | Disposition: A | Discharge status: Home / Self Care | Payer: Commercial Managed Care - HMO | Attending: Psychiatry | Admitting: Psychiatry

## 2022-07-09 ENCOUNTER — Emergency Department (HOSPITAL_COMMUNITY)
Admission: EM | Admit: 2022-07-09 | Discharge: 2022-07-09 | Discharge status: Against Medical Advice | Payer: Commercial Managed Care - HMO | Attending: Emergency Medicine | Admitting: Emergency Medicine

## 2022-07-09 ENCOUNTER — Encounter (HOSPITAL_COMMUNITY): Payer: Self-pay

## 2022-07-09 DIAGNOSIS — W228XXA Striking against or struck by other objects, initial encounter: Secondary | ICD-10-CM | POA: Insufficient documentation

## 2022-07-09 DIAGNOSIS — F10920 Alcohol use, unspecified with intoxication, uncomplicated: Secondary | ICD-10-CM | POA: Diagnosis not present

## 2022-07-09 DIAGNOSIS — Z79899 Other long term (current) drug therapy: Secondary | ICD-10-CM | POA: Diagnosis not present

## 2022-07-09 DIAGNOSIS — R519 Headache, unspecified: Secondary | ICD-10-CM | POA: Diagnosis present

## 2022-07-09 DIAGNOSIS — I10 Essential (primary) hypertension: Secondary | ICD-10-CM | POA: Diagnosis not present

## 2022-07-09 LAB — RAPID URINE DRUG SCREEN, HOSP PERFORMED
Amphetamines: NOT DETECTED
Barbiturates: POSITIVE — AB
Benzodiazepines: POSITIVE — AB
Cocaine: POSITIVE — AB
Opiates: NOT DETECTED
Tetrahydrocannabinol: NOT DETECTED

## 2022-07-09 LAB — URINALYSIS, ROUTINE W REFLEX MICROSCOPIC
Bacteria, UA: NONE SEEN
Bilirubin Urine: NEGATIVE
Glucose, UA: NEGATIVE mg/dL
Ketones, ur: NEGATIVE mg/dL
Leukocytes,Ua: NEGATIVE
Nitrite: NEGATIVE
Protein, ur: NEGATIVE mg/dL
Specific Gravity, Urine: 1.004 — ABNORMAL LOW (ref 1.005–1.030)
pH: 7 (ref 5.0–8.0)
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
## 2022-07-09 NOTE — ED Notes (Signed)
Notified pt has arrived at [facility]
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
## 2022-07-10 ENCOUNTER — Other Ambulatory Visit: Payer: Self-pay

## 2022-07-10 ENCOUNTER — Emergency Department (HOSPITAL_COMMUNITY)
Admission: EM | Admit: 2022-07-10 | Discharge: 2022-07-10 | Disposition: A | Discharge status: Home / Self Care | Payer: Commercial Managed Care - HMO | Attending: Emergency Medicine | Admitting: Emergency Medicine

## 2022-07-10 ENCOUNTER — Emergency Department (HOSPITAL_COMMUNITY): Payer: Commercial Managed Care - HMO

## 2022-07-10 DIAGNOSIS — F1099 Alcohol use, unspecified with unspecified alcohol-induced disorder: Secondary | ICD-10-CM | POA: Insufficient documentation

## 2022-07-10 DIAGNOSIS — R0789 Other chest pain: Secondary | ICD-10-CM | POA: Diagnosis present

## 2022-07-10 DIAGNOSIS — F1090 Alcohol use, unspecified, uncomplicated: Secondary | ICD-10-CM

## 2022-07-10 LAB — CBC
HCT: 41.9 % (ref 39.0–52.0)
Hemoglobin: 14.3 g/dL (ref 13.0–17.0)
MCH: 30.6 pg (ref 26.0–34.0)
MCHC: 34.1 g/dL (ref 30.0–36.0)
MCV: 89.7 fL (ref 80.0–100.0)
Platelets: 74 10*3/uL — ABNORMAL LOW (ref 150–400)
RBC: 4.67 MIL/uL (ref 4.22–5.81)
RDW: 13.8 % (ref 11.5–15.5)
WBC: 3.9 10*3/uL — ABNORMAL LOW (ref 4.0–10.5)
nRBC: 0 % (ref 0.0–0.2)

## 2022-07-10 LAB — BASIC METABOLIC PANEL
Anion gap: 13 (ref 5–15)
BUN: <5 mg/dL — ABNORMAL LOW (ref 6–20)
CO2: 24 mmol/L (ref 22–32)
Calcium: 8.1 mg/dL — ABNORMAL LOW (ref 8.9–10.3)
Chloride: 104 mmol/L (ref 98–111)
Creatinine, Ser: 0.72 mg/dL (ref 0.61–1.24)
GFR, Estimated: >60 mL/min (ref >60)
Glucose, Bld: 88 mg/dL (ref 70–99)
Potassium: 3.4 mmol/L — ABNORMAL LOW (ref 3.5–5.1)
Sodium: 141 mmol/L (ref 135–145)

## 2022-07-10 LAB — TROPONIN I (HIGH SENSITIVITY)
Troponin I (High Sensitivity): 5 ng/L (ref <18)
Troponin I (High Sensitivity): 5 ng/L (ref <18)

## 2022-07-10 LAB — LACTIC ACID, PLASMA: Lactic Acid, Venous: 2.9 mmol/L (ref 0.5–1.9)

## 2022-07-10 LAB — ETHANOL: Alcohol, Ethyl (B): 224 mg/dL — ABNORMAL HIGH (ref <10)

## 2022-07-10 MED ORDER — CHLORDIAZEPOXIDE HCL 25 MG PO CAPS
100.0000 mg | ORAL_CAPSULE | Freq: Once | ORAL | Status: AC
  Administered 2022-07-10: 100 mg via ORAL
  Filled 2022-07-10: qty 4

## 2022-07-10 MED ORDER — LORAZEPAM 1 MG PO TABS
2.0000 mg | ORAL_TABLET | Freq: Once | ORAL | Status: AC
  Administered 2022-07-10: 2 mg via ORAL
  Filled 2022-07-10: qty 2

## 2022-07-10 NOTE — Discharge Instructions (Signed)
You have been seen today for your complaint of alcohol use. Your lab work  cardiac workup negative. Your imaging was reassuring. Your discharge medications include your home medications. Follow up with: [facility] immediately for alcohol detox Please seek immediate medical care if you develop any of the following symptoms: Your chest pain gets worse. You have a cough that gets worse, or you cough up blood. You have severe pain in your abdomen. You faint. You have sudden, unexplained chest discomfort. You have sudden, unexplained discomfort in your arms, back, neck, or jaw. You have shortness of breath at any time. You suddenly start to sweat, or your skin gets clammy. You feel nausea or you vomit. You suddenly feel lightheaded or dizzy. You have severe weakness, or unexplained weakness or fatigue. Your heart begins to beat quickly, or it feels like it is skipping beats. At this time there does not appear to be the presence of an emergent medical condition, however there is always the potential for conditions to change. Please read and follow the below instructions.  Do not take your medicine if  develop an itchy rash, swelling in your mouth or lips, or difficulty breathing; call 911 and seek immediate emergency medical attention if this occurs.  You may review your lab tests and imaging results in their entirety on your MyChart account.  Please discuss all results of fully with your primary care provider and other specialist at your follow-up visit.  Note: Portions of this text may have been transcribed using voice recognition software. Every effort was made to ensure accuracy; however, inadvertent computerized transcription errors may still be present.
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
## 2022-07-10 NOTE — ED Triage Notes (Signed)
Pt reports chest pain since last night.
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
## 2022-09-10 ENCOUNTER — Other Ambulatory Visit (HOSPITAL_COMMUNITY): Payer: Self-pay | Admitting: Psychiatry

## 2022-09-10 DIAGNOSIS — F315 Bipolar disorder, current episode depressed, severe, with psychotic features: Secondary | ICD-10-CM

## 2022-09-10 DIAGNOSIS — F411 Generalized anxiety disorder: Secondary | ICD-10-CM

## 2022-11-09 ENCOUNTER — Other Ambulatory Visit: Payer: Self-pay | Admitting: Student

## 2022-11-09 DIAGNOSIS — F411 Generalized anxiety disorder: Secondary | ICD-10-CM

## 2022-11-09 DIAGNOSIS — F315 Bipolar disorder, current episode depressed, severe, with psychotic features: Secondary | ICD-10-CM

## 2022-11-17 ENCOUNTER — Emergency Department (HOSPITAL_COMMUNITY): Payer: Medicaid Other

## 2022-11-17 ENCOUNTER — Encounter (HOSPITAL_COMMUNITY): Payer: Self-pay

## 2022-11-17 ENCOUNTER — Other Ambulatory Visit: Payer: Self-pay

## 2022-11-17 ENCOUNTER — Emergency Department (EMERGENCY_DEPARTMENT_HOSPITAL)
Admission: EM | Admit: 2022-11-17 | Discharge: 2022-11-18 | Disposition: A | Discharge status: Other Acute Inpatient Hospital | Payer: Medicaid Other | Source: Home / Self Care | Attending: Emergency Medicine | Admitting: Emergency Medicine

## 2022-11-17 DIAGNOSIS — Z8616 Personal history of COVID-19: Secondary | ICD-10-CM | POA: Insufficient documentation

## 2022-11-17 DIAGNOSIS — Z853 Personal history of malignant neoplasm of breast: Secondary | ICD-10-CM | POA: Insufficient documentation

## 2022-11-17 DIAGNOSIS — Z87891 Personal history of nicotine dependence: Secondary | ICD-10-CM | POA: Insufficient documentation

## 2022-11-17 DIAGNOSIS — I2510 Atherosclerotic heart disease of native coronary artery without angina pectoris: Secondary | ICD-10-CM | POA: Insufficient documentation

## 2022-11-17 DIAGNOSIS — R45851 Suicidal ideations: Secondary | ICD-10-CM | POA: Insufficient documentation

## 2022-11-17 DIAGNOSIS — F1414 Cocaine abuse with cocaine-induced mood disorder: Secondary | ICD-10-CM | POA: Insufficient documentation

## 2022-11-17 DIAGNOSIS — R569 Unspecified convulsions: Secondary | ICD-10-CM | POA: Insufficient documentation

## 2022-11-17 DIAGNOSIS — Z85528 Personal history of other malignant neoplasm of kidney: Secondary | ICD-10-CM | POA: Insufficient documentation

## 2022-11-17 DIAGNOSIS — F1994 Other psychoactive substance use, unspecified with psychoactive substance-induced mood disorder: Secondary | ICD-10-CM | POA: Diagnosis not present

## 2022-11-17 DIAGNOSIS — I10 Essential (primary) hypertension: Secondary | ICD-10-CM | POA: Insufficient documentation

## 2022-11-17 DIAGNOSIS — F1910 Other psychoactive substance abuse, uncomplicated: Secondary | ICD-10-CM

## 2022-11-17 DIAGNOSIS — T1491XA Suicide attempt, initial encounter: Secondary | ICD-10-CM | POA: Diagnosis present

## 2022-11-17 LAB — CBC WITH DIFFERENTIAL/PLATELET
Abs Immature Granulocytes: 0.01 10*3/uL (ref 0.00–0.07)
Basophils Absolute: 0 10*3/uL (ref 0.0–0.1)
Basophils Relative: 1 %
Eosinophils Absolute: 0 10*3/uL (ref 0.0–0.5)
Eosinophils Relative: 0 %
HCT: 43.7 % (ref 39.0–52.0)
Hemoglobin: 15.1 g/dL (ref 13.0–17.0)
Immature Granulocytes: 0 %
Lymphocytes Relative: 22 %
Lymphs Abs: 0.8 10*3/uL (ref 0.7–4.0)
MCH: 30.3 pg (ref 26.0–34.0)
MCHC: 34.6 g/dL (ref 30.0–36.0)
MCV: 87.6 fL (ref 80.0–100.0)
Monocytes Absolute: 0.3 10*3/uL (ref 0.1–1.0)
Monocytes Relative: 7 %
Neutro Abs: 2.7 10*3/uL (ref 1.7–7.7)
Neutrophils Relative %: 70 %
Platelets: 144 10*3/uL — ABNORMAL LOW (ref 150–400)
RBC: 4.99 MIL/uL (ref 4.22–5.81)
RDW: 16.2 % — ABNORMAL HIGH (ref 11.5–15.5)
WBC: 3.9 10*3/uL — ABNORMAL LOW (ref 4.0–10.5)
nRBC: 0 % (ref 0.0–0.2)

## 2022-11-17 LAB — COMPREHENSIVE METABOLIC PANEL
ALT: 110 U/L — ABNORMAL HIGH (ref 0–44)
AST: 142 U/L — ABNORMAL HIGH (ref 15–41)
Albumin: 4.1 g/dL (ref 3.5–5.0)
Alkaline Phosphatase: 68 U/L (ref 38–126)
Anion gap: 15 (ref 5–15)
BUN: 11 mg/dL (ref 6–20)
CO2: 21 mmol/L — ABNORMAL LOW (ref 22–32)
Calcium: 8.8 mg/dL — ABNORMAL LOW (ref 8.9–10.3)
Chloride: 101 mmol/L (ref 98–111)
Creatinine, Ser: 0.85 mg/dL (ref 0.61–1.24)
GFR, Estimated: >60 mL/min (ref >60)
Glucose, Bld: 79 mg/dL (ref 70–99)
Potassium: 3.8 mmol/L (ref 3.5–5.1)
Sodium: 137 mmol/L (ref 135–145)
Total Bilirubin: 0.8 mg/dL (ref 0.3–1.2)
Total Protein: 8 g/dL (ref 6.5–8.1)

## 2022-11-17 LAB — RAPID URINE DRUG SCREEN, HOSP PERFORMED
Amphetamines: NOT DETECTED
Barbiturates: NOT DETECTED
Benzodiazepines: NOT DETECTED
Cocaine: POSITIVE — AB
Opiates: NOT DETECTED
Tetrahydrocannabinol: NOT DETECTED

## 2022-11-17 LAB — ETHANOL: Alcohol, Ethyl (B): 128 mg/dL — ABNORMAL HIGH (ref <10)

## 2022-11-17 LAB — HIV ANTIBODY (ROUTINE TESTING W REFLEX): HIV Screen 4th Generation wRfx: NONREACTIVE

## 2022-11-17 MED ORDER — THIAMINE HCL 100 MG/ML IJ SOLN: 100.0000 mg | Freq: Every day | INTRAMUSCULAR | Status: DC

## 2022-11-17 MED ORDER — HYDROXYZINE HCL 25 MG PO TABS
50.0000 mg | ORAL_TABLET | Freq: Two times a day (BID) | ORAL | Status: DC
  Administered 2022-11-17: 50 mg via ORAL
  Filled 2022-11-17: qty 2

## 2022-11-17 MED ORDER — SODIUM CHLORIDE 0.9 % IV SOLN
1000.0000 mg | Freq: Once | INTRAVENOUS | Status: AC
  Administered 2022-11-17: 1000 mg via INTRAVENOUS
  Filled 2022-11-17: qty 20

## 2022-11-17 MED ORDER — FOLIC ACID 1 MG PO TABS
1.0000 mg | ORAL_TABLET | Freq: Every day | ORAL | Status: DC
  Administered 2022-11-17: 1 mg via ORAL
  Filled 2022-11-17: qty 1

## 2022-11-17 MED ORDER — HYDROXYZINE HCL 25 MG PO TABS: 25.0000 mg | ORAL_TABLET | Freq: Once | ORAL | Status: DC

## 2022-11-17 MED ORDER — LORAZEPAM 1 MG PO TABS
1.0000 mg | ORAL_TABLET | ORAL | Status: DC | PRN
  Administered 2022-11-17: 2 mg via ORAL
  Administered 2022-11-17: 1 mg via ORAL
  Administered 2022-11-18: 3 mg via ORAL
  Filled 2022-11-17 (×2): qty 2
  Filled 2022-11-17: qty 1

## 2022-11-17 MED ORDER — PHENYTOIN SODIUM EXTENDED 100 MG PO CAPS: 200.0000 mg | ORAL_CAPSULE | Freq: Every day | ORAL | Status: DC

## 2022-11-17 MED ORDER — GABAPENTIN 300 MG PO CAPS
300.0000 mg | ORAL_CAPSULE | Freq: Three times a day (TID) | ORAL | Status: DC
  Administered 2022-11-17 (×2): 300 mg via ORAL
  Filled 2022-11-17 (×2): qty 1

## 2022-11-17 MED ORDER — ACETAMINOPHEN 325 MG PO TABS
650.0000 mg | ORAL_TABLET | Freq: Once | ORAL | Status: AC
  Administered 2022-11-17: 650 mg via ORAL
  Filled 2022-11-17: qty 2

## 2022-11-17 MED ORDER — THIAMINE MONONITRATE 100 MG PO TABS
100.0000 mg | ORAL_TABLET | Freq: Every day | ORAL | Status: DC
  Administered 2022-11-17: 100 mg via ORAL
  Filled 2022-11-17: qty 1

## 2022-11-17 MED ORDER — HYDROXYZINE HCL 25 MG PO TABS
25.0000 mg | ORAL_TABLET | Freq: Four times a day (QID) | ORAL | Status: DC | PRN
  Administered 2022-11-17 – 2022-11-18 (×2): 25 mg via ORAL
  Filled 2022-11-17 (×2): qty 1

## 2022-11-17 MED ORDER — HYDROXYZINE PAMOATE 50 MG PO CAPS
50.0000 mg | ORAL_CAPSULE | Freq: Two times a day (BID) | ORAL | Status: DC
  Filled 2022-11-17: qty 1

## 2022-11-17 MED ORDER — LACTATED RINGERS IV SOLN: INTRAVENOUS | Status: DC

## 2022-11-17 MED ORDER — LORAZEPAM 2 MG/ML IJ SOLN: 1.0000 mg | INTRAMUSCULAR | Status: DC | PRN

## 2022-11-17 MED ORDER — ADULT MULTIVITAMIN W/MINERALS CH
1.0000 | ORAL_TABLET | Freq: Every day | ORAL | Status: DC
  Administered 2022-11-17: 1 via ORAL
  Filled 2022-11-17: qty 1

## 2022-11-17 MED ORDER — DIPHENHYDRAMINE HCL 25 MG PO CAPS
25.0000 mg | ORAL_CAPSULE | Freq: Once | ORAL | Status: AC
  Administered 2022-11-17: 25 mg via ORAL
  Filled 2022-11-17: qty 1

## 2022-11-17 NOTE — ED Triage Notes (Signed)
BIBA from [facility] for possible seizures. Pt admits that he smoked crack and drank two 40 oz. beers earlier today, is unclear as to whether he had a seizure or passed out from drinking . Pt states he hasn't taken medications in about a week. 126 HR 146/100 BP 112 cbg 96% room air

## 2022-11-17 NOTE — Social Work (Signed)
CSW has reviewed the patient's chart at this time the patient is under [facility]. TOC will follow up with the patient once cleared.

## 2022-11-17 NOTE — ED Notes (Signed)
Assumed care of patient.

## 2022-11-17 NOTE — ED Notes (Signed)
Unsuccessful IV attempts x 4

## 2022-11-17 NOTE — ED Notes (Signed)
Pt belongings placed in locker #30, pt bother is taking his phone home with him.
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
## 2022-11-17 NOTE — ED Notes (Signed)
Pt states he has been feeling suicidal for about three days, has been drinking hand sanitizer and Listerine, and planning to take pills.

## 2022-11-17 NOTE — ED Provider Notes (Signed)
[place] EMERGENCY DEPARTMENT AT [facility] Provider Note   CSN: [id number] Arrival date & time: 11/17/22  1329     History  Chief Complaint  Patient presents with   Addiction Problem   Seizures    [name] is a 42 y.o. male.  42 year old male presents after having witnessed seizure by his roommate.  Has known history of seizure disorder and has been noncompliant with his Dilantin.  Patient also admits to drinking alcohol daily states he drinks 80 ounces of beer per day.  Also notes that he used crack cocaine today as well.  EMS was called by patient's remaining and he was not postictal on arrival.  Patient denies biting his tongue or having urinary incontinence.  States he has a mild headache.  He denies any SI or HI       Home Medications Prior to Admission medications   Medication Sig Start Date End Date Taking? Authorizing Provider  acamprosate (CAMPRAL) 333 MG tablet Take 666 mg by mouth 3 (three) times daily. 05/18/22   [provider]  amLODipine (NORVASC) 5 MG tablet Take 1 tablet (5 mg total) by mouth daily. 02/11/22   Rana Snare, DO  ARIPiprazole (ABILIFY) 10 MG tablet Take 1 tablet (10 mg total) by mouth at bedtime. 02/11/22   Rana Snare, DO  atorvastatin (LIPITOR) 40 MG tablet Take 40 mg by mouth daily.    [provider]  busPIRone (BUSPAR) 10 MG tablet Take 1 tablet (10 mg total) by mouth 3 (three) times daily. 02/11/22   Rana Snare, DO  chlordiazePOXIDE (LIBRIUM) 25 MG capsule 50mg  PO TID x 1D, then 25-50mg  PO BID X 1D, then 25-50mg  PO QD X 1D 07/08/22   [name] Dibbles, MD  folic acid (FOLVITE) 1 MG tablet Take 1 tablet (1 mg total) by mouth daily. 06/06/22   Burnadette Pop, MD  gabapentin (NEURONTIN) 300 MG capsule Take 1 capsule (300 mg total) by mouth 3 (three) times daily. 02/11/22   Rana Snare, DO  hydrOXYzine (VISTARIL) 25 MG capsule Take 50 mg by mouth 2 (two) times daily. 05/18/22   [provider]   ibuprofen (ADVIL) 200 MG tablet Take 400 mg by mouth every 6 (six) hours as needed for mild pain.    [provider]  lamoTRIgine (LAMICTAL) 25 MG tablet Take 1 tablet (25 mg total) by mouth daily. 06/25/22 07/25/22  Quincy Simmonds, MD  magnesium oxide (MAG-OX) 400 (240 Mg) MG tablet Take 1 tablet (400 mg total) by mouth 2 (two) times daily. 06/06/22   Burnadette Pop, MD  meclizine (ANTIVERT) 25 MG tablet Take 1 tablet (25 mg total) by mouth 3 (three) times daily as needed for dizziness. 02/11/22   Rana Snare, DO  nicotine (NICODERM CQ - DOSED IN MG/24 HR) 7 mg/24hr patch Place 1 patch (7 mg total) onto the skin daily as needed (tobacco withdrawal). Patient not taking: Reported on 05/11/2022 12/19/21   Sarita Bottom, MD  thiamine (VITAMIN B-1) 100 MG tablet Take 1 tablet (100 mg total) by mouth daily. 06/06/22   Burnadette Pop, MD      Allergies    Codeine, Penicillins, Morphine, Coconut (cocos nucifera), Coconut flavor, Grapefruit concentrate, Morphine and related, Oxycodone, and Norco [hydrocodone-acetaminophen]    Review of Systems   Review of Systems  All other systems reviewed and are negative.   Physical Exam Updated Vital Signs Pulse (!) 108   SpO2 98%  Physical Exam Vitals and nursing note reviewed.  Constitutional:  General: She is not in acute distress.    Appearance: Normal appearance. She is well-developed. She is not toxic-appearing.  HENT:     Head: Normocephalic and atraumatic.  Eyes:     General: Lids are normal.     Conjunctiva/sclera: Conjunctivae normal.     Pupils: Pupils are equal, round, and reactive to light.  Neck:     Thyroid: No thyroid mass.     Trachea: No tracheal deviation.  Cardiovascular:     Rate and Rhythm: Normal rate and regular rhythm.     Heart sounds: Normal heart sounds. No murmur heard.    No gallop.  Pulmonary:     Effort: Pulmonary effort is normal. No respiratory distress.     Breath sounds: Normal breath sounds. No  stridor. No decreased breath sounds, wheezing, rhonchi or rales.  Abdominal:     General: There is no distension.     Palpations: Abdomen is soft.     Tenderness: There is no abdominal tenderness. There is no rebound.  Musculoskeletal:        General: No tenderness. Normal range of motion.     Cervical back: Normal range of motion and neck supple.  Skin:    General: Skin is warm and dry.     Findings: No abrasion or rash.  Neurological:     Mental Status: She is alert and oriented to person, place, and time. Mental status is at baseline.     GCS: GCS eye subscore is 4. GCS verbal subscore is 5. GCS motor subscore is 6.     Cranial Nerves: No cranial nerve deficit.     Sensory: No sensory deficit.     Motor: Motor function is intact.  Psychiatric:        Attention and Perception: Attention normal.        Mood and Affect: Affect is flat.        Speech: Speech normal.        Behavior: Behavior normal.     ED Results / Procedures / Treatments   Labs (all labs ordered are listed, but only abnormal results are displayed) Labs Reviewed  COMPREHENSIVE METABOLIC PANEL  ETHANOL  RAPID URINE DRUG SCREEN, HOSP PERFORMED  CBC WITH DIFFERENTIAL/PLATELET  HIV ANTIBODY (ROUTINE TESTING W REFLEX)    EKG EKG Interpretation  Date/Time:  Wednesday Nov 17 2022 14:15:33 EDT Ventricular Rate:  99 PR Interval:  131 QRS Duration: 87 QT Interval:  397 QTC Calculation: 510 R Axis:   71 Text Interpretation: Sinus rhythm Prolonged QT interval Confirmed by Lorre Nick ([id number]) on 11/17/2022 2:48:31 PM  Radiology No results found.  Procedures Procedures    Medications Ordered in ED Medications  lactated ringers infusion (has no administration in time range)  phenytoin (DILANTIN) 1,000 mg in sodium chloride 0.9 % 250 mL IVPB (has no administration in time range)    ED Course/ Medical Decision Making/ A&P                             Medical Decision Making Amount and/or Complexity of  Data Reviewed Labs: ordered. Radiology: ordered. ECG/medicine tests: ordered.  Risk OTC drugs. Prescription drug management.   Patient is EKG per interpretation shows sinus rhythm.  No signs of acute ischemic changes noted.  Patient to have head CT due to possible seizure activity.  Patient also had vague suicidal ideations.  Workup is pending at this time and signed out to next  divider        Final Clinical Impression(s) / ED Diagnoses Final diagnoses:  None    Rx / DC Orders ED Discharge Orders     None         Lorre Nick, MD 11/20/22 1721

## 2022-11-17 NOTE — ED Provider Notes (Signed)
Patient signed out to me at 1500 by Dr. Freida Busman pending labs and reassessment for medical clearance.  In short this is a 42 year old male that presented to the emergency department after a seizure.  He reported smoking crack and drinking alcohol earlier today and has been off his seizure medications for the past week.  Patient is initially tachycardic, consistent with his intoxication with no signs of alcohol withdrawal at this time.  He was loaded with his home Dilantin.  [name] workup shows UDS positive for pain and an alcohol level of 128 and otherwise is within normal range.  CT head is without acute traumatic injury.  The patient states that he is having a headache from hitting his head during the seizure and feels itchy from coming down off his drugs and alcohol.  He will be given Tylenol and Benadryl.  The patient is also complaining of suicidal ideation.  He is medically cleared for psychiatry evaluation.  He would like to be voluntary at this time.   Rexford Maus, DO 11/17/22 1605

## 2022-11-18 ENCOUNTER — Inpatient Hospital Stay (HOSPITAL_COMMUNITY)
Admission: AD | Admit: 2022-11-18 | Discharge: 2022-11-24 | DRG: 885 | Disposition: A | Discharge status: Home / Self Care | Payer: Medicaid Other | Attending: Psychiatry | Admitting: Psychiatry

## 2022-11-18 ENCOUNTER — Encounter (HOSPITAL_COMMUNITY): Payer: Self-pay | Admitting: Psychiatry

## 2022-11-18 DIAGNOSIS — F4310 Post-traumatic stress disorder, unspecified: Secondary | ICD-10-CM | POA: Diagnosis present

## 2022-11-18 DIAGNOSIS — I2510 Atherosclerotic heart disease of native coronary artery without angina pectoris: Secondary | ICD-10-CM | POA: Diagnosis present

## 2022-11-18 DIAGNOSIS — F1020 Alcohol dependence, uncomplicated: Secondary | ICD-10-CM | POA: Diagnosis present

## 2022-11-18 DIAGNOSIS — F1414 Cocaine abuse with cocaine-induced mood disorder: Secondary | ICD-10-CM | POA: Diagnosis present

## 2022-11-18 DIAGNOSIS — Z8616 Personal history of COVID-19: Secondary | ICD-10-CM | POA: Diagnosis not present

## 2022-11-18 DIAGNOSIS — F411 Generalized anxiety disorder: Secondary | ICD-10-CM | POA: Diagnosis present

## 2022-11-18 DIAGNOSIS — Z79899 Other long term (current) drug therapy: Secondary | ICD-10-CM

## 2022-11-18 DIAGNOSIS — F10229 Alcohol dependence with intoxication, unspecified: Secondary | ICD-10-CM | POA: Diagnosis present

## 2022-11-18 DIAGNOSIS — Z9151 Personal history of suicidal behavior: Secondary | ICD-10-CM | POA: Diagnosis not present

## 2022-11-18 DIAGNOSIS — F17210 Nicotine dependence, cigarettes, uncomplicated: Secondary | ICD-10-CM | POA: Diagnosis present

## 2022-11-18 DIAGNOSIS — G40909 Epilepsy, unspecified, not intractable, without status epilepticus: Secondary | ICD-10-CM | POA: Diagnosis present

## 2022-11-18 DIAGNOSIS — Z853 Personal history of malignant neoplasm of breast: Secondary | ICD-10-CM | POA: Diagnosis not present

## 2022-11-18 DIAGNOSIS — F319 Bipolar disorder, unspecified: Secondary | ICD-10-CM

## 2022-11-18 DIAGNOSIS — Z85528 Personal history of other malignant neoplasm of kidney: Secondary | ICD-10-CM

## 2022-11-18 DIAGNOSIS — F3164 Bipolar disorder, current episode mixed, severe, with psychotic features: Secondary | ICD-10-CM | POA: Diagnosis not present

## 2022-11-18 DIAGNOSIS — R45851 Suicidal ideations: Secondary | ICD-10-CM | POA: Diagnosis present

## 2022-11-18 DIAGNOSIS — F315 Bipolar disorder, current episode depressed, severe, with psychotic features: Principal | ICD-10-CM | POA: Diagnosis present

## 2022-11-18 DIAGNOSIS — Z880 Allergy status to penicillin: Secondary | ICD-10-CM

## 2022-11-18 DIAGNOSIS — E7800 Pure hypercholesterolemia, unspecified: Secondary | ICD-10-CM | POA: Diagnosis present

## 2022-11-18 DIAGNOSIS — F1994 Other psychoactive substance use, unspecified with psychoactive substance-induced mood disorder: Secondary | ICD-10-CM | POA: Diagnosis not present

## 2022-11-18 DIAGNOSIS — F14288 Cocaine dependence with other cocaine-induced disorder: Secondary | ICD-10-CM | POA: Diagnosis present

## 2022-11-18 DIAGNOSIS — I10 Essential (primary) hypertension: Secondary | ICD-10-CM | POA: Diagnosis present

## 2022-11-18 DIAGNOSIS — F1420 Cocaine dependence, uncomplicated: Secondary | ICD-10-CM | POA: Diagnosis present

## 2022-11-18 DIAGNOSIS — F1510 Other stimulant abuse, uncomplicated: Secondary | ICD-10-CM | POA: Diagnosis present

## 2022-11-18 MED ORDER — DIVALPROEX SODIUM 500 MG PO DR TAB
500.0000 mg | DELAYED_RELEASE_TABLET | Freq: Two times a day (BID) | ORAL | Status: DC
  Administered 2022-11-18 – 2022-11-24 (×12): 500 mg via ORAL
  Filled 2022-11-18 (×17): qty 1

## 2022-11-18 MED ORDER — AMLODIPINE BESYLATE 5 MG PO TABS
5.0000 mg | ORAL_TABLET | Freq: Every day | ORAL | Status: DC
  Administered 2022-11-18 – 2022-11-19 (×2): 5 mg via ORAL
  Filled 2022-11-18 (×4): qty 1

## 2022-11-18 MED ORDER — AMLODIPINE BESYLATE 5 MG PO TABS
5.0000 mg | ORAL_TABLET | Freq: Every day | ORAL | Status: DC
  Administered 2022-11-18: 5 mg via ORAL
  Filled 2022-11-18: qty 1

## 2022-11-18 MED ORDER — LORAZEPAM 2 MG/ML IJ SOLN: 2.0000 mg | Freq: Three times a day (TID) | INTRAMUSCULAR | Status: DC | PRN

## 2022-11-18 MED ORDER — ADULT MULTIVITAMIN W/MINERALS CH
1.0000 | ORAL_TABLET | Freq: Every day | ORAL | Status: DC
  Administered 2022-11-18 – 2022-11-24 (×7): 1 via ORAL
  Filled 2022-11-18 (×11): qty 1

## 2022-11-18 MED ORDER — VITAMIN B-1 100 MG PO TABS
100.0000 mg | ORAL_TABLET | Freq: Every day | ORAL | Status: DC
  Administered 2022-11-19 – 2022-11-24 (×6): 100 mg via ORAL
  Filled 2022-11-18 (×9): qty 1

## 2022-11-18 MED ORDER — SODIUM CHLORIDE 0.9 % IV SOLN: 20.0000 mg/kg | Freq: Once | INTRAVENOUS | Status: DC

## 2022-11-18 MED ORDER — LORAZEPAM 1 MG PO TABS
1.0000 mg | ORAL_TABLET | Freq: Every day | ORAL | Status: AC
  Administered 2022-11-21: 1 mg via ORAL
  Filled 2022-11-18: qty 1

## 2022-11-18 MED ORDER — LORAZEPAM 1 MG PO TABS
1.0000 mg | ORAL_TABLET | Freq: Four times a day (QID) | ORAL | Status: AC
  Administered 2022-11-18 (×4): 1 mg via ORAL
  Filled 2022-11-18 (×4): qty 1

## 2022-11-18 MED ORDER — LORAZEPAM 1 MG PO TABS: 1.0000 mg | ORAL_TABLET | Freq: Four times a day (QID) | ORAL | Status: DC | PRN

## 2022-11-18 MED ORDER — ALUM & MAG HYDROXIDE-SIMETH 200-200-20 MG/5ML PO SUSP
30.0000 mL | ORAL | Status: DC | PRN
  Administered 2022-11-22 (×2): 30 mL via ORAL
  Filled 2022-11-18: qty 30

## 2022-11-18 MED ORDER — NICOTINE 21 MG/24HR TD PT24
21.0000 mg | MEDICATED_PATCH | Freq: Every day | TRANSDERMAL | Status: DC
  Filled 2022-11-18 (×5): qty 1

## 2022-11-18 MED ORDER — HYDROXYZINE HCL 25 MG PO TABS
25.0000 mg | ORAL_TABLET | Freq: Four times a day (QID) | ORAL | Status: DC | PRN
Start: 2022-11-18 — End: 2022-11-18

## 2022-11-18 MED ORDER — HYDROXYZINE HCL 25 MG PO TABS
25.0000 mg | ORAL_TABLET | Freq: Four times a day (QID) | ORAL | Status: DC | PRN
  Administered 2022-11-18 – 2022-11-24 (×10): 25 mg via ORAL
  Filled 2022-11-18 (×10): qty 1

## 2022-11-18 MED ORDER — ATORVASTATIN CALCIUM 40 MG PO TABS
40.0000 mg | ORAL_TABLET | Freq: Every day | ORAL | Status: DC
  Administered 2022-11-18 – 2022-11-24 (×7): 40 mg via ORAL
  Filled 2022-11-18 (×11): qty 1

## 2022-11-18 MED ORDER — TRAZODONE HCL 50 MG PO TABS
50.0000 mg | ORAL_TABLET | Freq: Every evening | ORAL | Status: DC | PRN
  Administered 2022-11-19 – 2022-11-20 (×2): 50 mg via ORAL
  Filled 2022-11-18 (×3): qty 1

## 2022-11-18 MED ORDER — LORAZEPAM 1 MG PO TABS
1.0000 mg | ORAL_TABLET | Freq: Two times a day (BID) | ORAL | Status: AC
  Administered 2022-11-20 (×2): 1 mg via ORAL
  Filled 2022-11-18 (×2): qty 1

## 2022-11-18 MED ORDER — GABAPENTIN 300 MG PO CAPS
300.0000 mg | ORAL_CAPSULE | Freq: Three times a day (TID) | ORAL | Status: DC
  Administered 2022-11-18 – 2022-11-24 (×19): 300 mg via ORAL
  Filled 2022-11-18 (×29): qty 1

## 2022-11-18 MED ORDER — LORAZEPAM 1 MG PO TABS
1.0000 mg | ORAL_TABLET | Freq: Three times a day (TID) | ORAL | Status: AC
  Administered 2022-11-19 (×3): 1 mg via ORAL
  Filled 2022-11-18 (×3): qty 1

## 2022-11-18 MED ORDER — DIVALPROEX SODIUM 500 MG PO DR TAB: 500.0000 mg | DELAYED_RELEASE_TABLET | Freq: Two times a day (BID) | ORAL | Status: DC

## 2022-11-18 MED ORDER — FOLIC ACID 1 MG PO TABS
1.0000 mg | ORAL_TABLET | Freq: Every day | ORAL | Status: DC
  Administered 2022-11-18 – 2022-11-24 (×7): 1 mg via ORAL
  Filled 2022-11-18 (×11): qty 1

## 2022-11-18 MED ORDER — ARIPIPRAZOLE 5 MG PO TABS
5.0000 mg | ORAL_TABLET | Freq: Every day | ORAL | Status: DC
  Filled 2022-11-18 (×2): qty 1

## 2022-11-18 MED ORDER — LORAZEPAM 1 MG PO TABS: 2.0000 mg | ORAL_TABLET | Freq: Three times a day (TID) | ORAL | Status: DC | PRN

## 2022-11-18 MED ORDER — MAGNESIUM HYDROXIDE 400 MG/5ML PO SUSP
30.0000 mL | Freq: Every day | ORAL | Status: DC | PRN
  Administered 2022-11-23: 30 mL via ORAL
  Filled 2022-11-18: qty 30

## 2022-11-18 MED ORDER — PHENOBARBITAL 32.4 MG PO TABS: 64.8000 mg | ORAL_TABLET | Freq: Two times a day (BID) | ORAL | Status: DC

## 2022-11-18 MED ORDER — CLONIDINE HCL 0.1 MG PO TABS
0.1000 mg | ORAL_TABLET | ORAL | Status: DC | PRN
  Administered 2022-11-18 (×2): 0.1 mg via ORAL
  Filled 2022-11-18 (×4): qty 1

## 2022-11-18 MED ORDER — LOPERAMIDE HCL 2 MG PO CAPS
2.0000 mg | ORAL_CAPSULE | ORAL | Status: AC | PRN
  Administered 2022-11-19: 4 mg via ORAL
  Filled 2022-11-18: qty 2

## 2022-11-18 MED ORDER — DIVALPROEX SODIUM 500 MG PO DR TAB: 1250.0000 mg | DELAYED_RELEASE_TABLET | ORAL | Status: DC

## 2022-11-18 NOTE — ED Notes (Signed)
Patient sleeping at this time. Will do VSs when she wakes up.  

## 2022-11-18 NOTE — Progress Notes (Signed)
Pt did not attend wrap-up group   

## 2022-11-18 NOTE — ED Notes (Signed)
Safe Transport here to take patient to [facility]

## 2022-11-18 NOTE — ED Notes (Signed)
Attempted to call [facility] to give report with no answer

## 2022-11-18 NOTE — ED Notes (Signed)
Safe transport called 

## 2022-11-18 NOTE — Progress Notes (Addendum)
Per verbal communication with [facility] Fransico Michael, RN pt is under review and PENDING admission to [facility] for TODAY 11/18/22 after 9:00am.    Pt was accepted to [facility] TODAY 11/18/2022; Bed Assignment PENDING conformation from[facility]  Pt meets inpatient criteria per Alona Bene, PMHNP  Attending Physician will be Dr. Phineas Inches  Report can be called to: -Adult unit: [phone]  Pt can arrive after: Day [facility] will coordinate with care team.  Care Team notified: Night [facility] Fransico Michael, RN, [address], LCSWA   [place], [place] 11/18/2022 @ 12:54 AM

## 2022-11-18 NOTE — Group Note (Unsigned)
Date:  11/18/2022 Time:  9:40 AM  Group Topic/Focus:  Goals Group:   The focus of this group is to help patients establish daily goals to achieve during treatment and discuss how the patient can incorporate goal setting into their daily lives to aide in recovery. Orientation:   The focus of this group is to educate the patient on the purpose and policies of crisis stabilization and provide a format to answer questions about their admission.  The group details unit policies and expectations of patients while admitted.     Participation Level:  {B[facility] PARTICIPATION LEVEL:22264}  Participation Quality:  {B[facility] PARTICIPATION QUALITY:22265}  Affect:  {BHH AFFECT:22266}  Cognitive:  {B[facility] COGNITIVE:22267}  Insight: {BHH Insight2:20797}  Engagement in Group:  {[facility] ENGAGEMENT IN GROUP:22268}  Modes of Intervention:  {BHH MODES OF INTERVENTION:22269}  Additional Comments:  ***  Kholton Coate Lashawn Gizella [place] 11/18/2022, 9:40 AM  

## 2022-11-18 NOTE — Group Note (Signed)
Date:  11/18/2022 Time:  9:48 AM  Group Topic/Focus:  Goals Group:   The focus of this group is to help patients establish daily goals to achieve during treatment and discuss how the patient can incorporate goal setting into their daily lives to aide in recovery. Orientation:   The focus of this group is to educate the patient on the purpose and policies of crisis stabilization and provide a format to answer questions about their admission.  The group details unit policies and expectations of patients while admitted.    Participation Level:  Did Not Attend  Participation Quality:    Affect:    Cognitive:    Insight:   Engagement in Group:    Modes of Intervention:    Additional Comments:    Shadman Tozzi Lashawn Jakiah Bienaime 11/18/2022, 9:48 AM  

## 2022-11-18 NOTE — Group Note (Signed)
Date:  11/18/2022 Time:  1:50 PM  Group Topic/Focus:  Wellness Toolbox:   The focus of this group is to discuss various aspects of wellness, balancing those aspects and exploring ways to increase the ability to experience wellness.  Patients will create a wellness toolbox for use upon discharge.    Participation Level:  Did Not Attend  Participation Quality:    Affect:    Cognitive:   Insight:   Engagement in Group:    Modes of Intervention:    Additional Comments:    [name] 11/18/2022, 1:50 PM  

## 2022-11-18 NOTE — Group Note (Signed)
Date:  11/18/2022 Time:  10:02 AM  Group Topic/Focus:  Goals Group:   The focus of this group is to help patients establish daily goals to achieve during treatment and discuss how the patient can incorporate goal setting into their daily lives to aide in recovery. Orientation:   The focus of this group is to educate the patient on the purpose and policies of crisis stabilization and provide a format to answer questions about their admission.  The group details unit policies and expectations of patients while admitted.    Participation Level:  Did Not Attend  Participation Quality:    Affect:    Cognitive:    Insight:   Engagement in Group:    Modes of Intervention:    Additional Comments:    Jaquita Rector 11/18/2022, 10:02 AM

## 2022-11-18 NOTE — Progress Notes (Signed)
Pt admitted to [facility] at this time from [facility]. Pt reports being picked up for seizure activity  by EMS and endorses suicidal thoughts. Pt gives a history of suicide attempt by drinking sanitizer. Pt does contract for safety. Pt presents irritable and fidgety however is cooperative noting not sleeping in days. Pt endorses alcohol abuse noting he drinks approximately a gallon of liquor a day and smokes crack cocaine. Pt endorses agitation and itching. Pt hypertensive. Pt brought to unit, received food per request, and is laying in bed.

## 2022-11-18 NOTE — Group Note (Signed)
[facility] LCSW Group Therapy Note   Group Date: 11/18/2022 Start Time: 1100 End Time: 1145   Type of Therapy/Topic:  Group Therapy:  Emotion Regulation  Participation Level:  Did Not Attend   Mood:  Description of Group:    The purpose of this group is to assist patients in learning to regulate negative emotions and experience positive emotions. Patients will be guided to discuss ways in which they have been vulnerable to their negative emotions. These vulnerabilities will be juxtaposed with experiences of positive emotions or situations, and patients challenged to use positive emotions to combat negative ones. Special emphasis will be placed on coping with negative emotions in conflict situations, and patients will process healthy conflict resolution skills.  Therapeutic Goals: Patient will identify two positive emotions or experiences to reflect on in order to balance out negative emotions:  Patient will label two or more emotions that they find the most difficult to experience:  Patient will be able to demonstrate positive conflict resolution skills through discussion or role plays:      Therapeutic Modalities:   Cognitive Behavioral Therapy Feelings Identification Dialectical Behavioral Therapy   Liberti Appleton S Katelin Kutsch, LCSW

## 2022-11-18 NOTE — Progress Notes (Addendum)
ON CALL PHONE CONSULT  Call from Dr. Roylene Reason  [facility]   Discussion: Patient admitted for suicidal ideation.  Patient with history of multiple seizures over the last few weeks, some within the setting of alcohol abuse and others unclear.  Was given a load of Dilantin prior to admission in the ER.  Dr. Vicente Masson wanted to know if continuing antiepileptics is warranted.  Given multiplicity of seizures and unclear surrounding circumstances, I would recommend that he be loaded with Depakote 1250 mg load x 1 IV followed by Depakote 500 mg twice daily p.o., which might be beneficial both for seizures as well as mood.  His LFTs are elevated hence that is not a great choice. I would recommend loading with phenobarbital and continue phenobarbital for now.  Please reach out with questions as needed.  -- Milon Dikes, MD Neurologist [facility] Pager: [phone]  Approximately 6 minutes of time were spent reviewing the case and discussion with the calling provider, more than 50% of which was spent in discussion.

## 2022-11-18 NOTE — [facility] Suicide Risk Assessment (Signed)
[facility] Admission Suicide Risk Assessment   Nursing information obtained from:    Demographic factors:  Gay, lesbian, or bisexual orientation, Male, Living alone Current Mental Status:  Suicidal ideation indicated by patient Loss Factors:  NA Historical Factors:  Prior suicide attempts Risk Reduction Factors:  NA  Total Time spent with patient: 30 minutes Principal Problem: Bipolar I disorder, current or most recent episode depressed, with psychotic features (HCC) Diagnosis:  Principal Problem:   Bipolar I disorder, current or most recent episode depressed, with psychotic features (HCC) Active Problems:   Alcohol use disorder, severe, dependence (HCC)   PTSD (post-traumatic stress disorder)   Cocaine dependence syndrome (HCC)   Substance induced mood disorder (HCC)   GAD (generalized anxiety disorder)  Subjective Data: See H&P   Continued Clinical Symptoms:  Alcohol Use Disorder Identification Test Final Score (AUDIT): 38 The "Alcohol Use Disorders Identification Test", Guidelines for Use in Primary Care, Second Edition.  World Science writer [facility]). Score between 0-7:  no or low risk or alcohol related problems. Score between 8-15:  moderate risk of alcohol related problems. Score between 16-19:  high risk of alcohol related problems. Score 20 or above:  warrants further diagnostic evaluation for alcohol dependence and treatment.   CLINICAL FACTORS:   Severe Anxiety and/or Agitation Panic Attacks Bipolar Disorder:   Depressive phase Alcohol/Substance Abuse/Dependencies More than one psychiatric diagnosis Currently Psychotic Previous Psychiatric Diagnoses and Treatments   Psychiatric Specialty Exam:  Presentation  General Appearance:  Disheveled  Eye Contact: Poor  Speech: Slow  Speech Volume: Decreased  Handedness: Right   Mood and Affect  Mood: Anxious; Depressed  Affect: Depressed   Thought Process  Thought Processes: Linear  Descriptions  of Associations:Intact  Orientation:Partial  Thought Content:Logical  History of Schizophrenia/Schizoaffective disorder:No  Duration of Psychotic Symptoms:N/A  Hallucinations:Hallucinations: Auditory; Command  Ideas of Reference:None  Suicidal Thoughts:Suicidal Thoughts: Yes, Passive SI Passive Intent and/or Plan: Without Intent; Without Plan  Homicidal Thoughts:Homicidal Thoughts: No   Sensorium  Memory: Immediate Good; Remote Fair  Judgment: Impaired  Insight: Lacking   Executive Functions  Concentration: Poor  Attention Span: Poor  Recall: [employer] of Knowledge: Fair  Language: Fair   Psychomotor Activity  Psychomotor Activity: Psychomotor Activity: Decreased   Assets  Assets: Communication Skills; Desire for Improvement; Social Support   Sleep  Sleep: Sleep: Poor    Physical Exam: Physical Exam See H&P  ROS See H&P  Blood pressure 124/78, pulse 88, temperature 98.2 F (36.8 C), temperature source Oral, resp. rate 18, height 5\' 6"  (1.676 m), weight 72.3 kg, SpO2 100 %. Body mass index is 25.73 kg/m.   COGNITIVE FEATURES THAT CONTRIBUTE TO RISK:  None    SUICIDE RISK:   Moderate:  Frequent suicidal ideation with limited intensity, and duration, some specificity in terms of plans, no associated intent, good self-control, limited dysphoria/symptomatology, some risk factors present, and identifiable protective factors, including available and accessible social support.  PLAN OF CARE: See H&P   I certify that inpatient services furnished can reasonably be expected to improve the patient's condition.   Cristy Hilts, MD 11/18/2022, 1:47 PM

## 2022-11-18 NOTE — Group Note (Unsigned)
Date:  11/18/2022 Time:  1:36 PM  Group Topic/Focus:  Wellness Toolbox:   The focus of this group is to discuss various aspects of wellness, balancing those aspects and exploring ways to increase the ability to experience wellness.  Patients will create a wellness toolbox for use upon discharge.     Participation Level:  {BHH PARTICIPATION LEVEL:22264}  Participation Quality:  {BHH PARTICIPATION QUALITY:22265}  Affect:  {BHH AFFECT:22266}  Cognitive:  {BHH COGNITIVE:22267}  Insight: {BHH Insight2:20797}  Engagement in Group:  {BHH ENGAGEMENT IN GROUP:22268}  Modes of Intervention:  {BHH MODES OF INTERVENTION:22269}  Additional Comments:  ***  Malayjah Otoole Lashawn Tysheena Ginzburg 11/18/2022, 1:36 PM  

## 2022-11-18 NOTE — H&P (Addendum)
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   Phineas Inches, MD Psychiatrist

## 2022-11-19 ENCOUNTER — Encounter (HOSPITAL_COMMUNITY): Payer: Self-pay

## 2022-11-19 DIAGNOSIS — F3164 Bipolar disorder, current episode mixed, severe, with psychotic features: Secondary | ICD-10-CM

## 2022-11-19 LAB — HEPATIC FUNCTION PANEL
ALT: 60 U/L — ABNORMAL HIGH (ref 0–44)
AST: 64 U/L — ABNORMAL HIGH (ref 15–41)
Albumin: 3.2 g/dL — ABNORMAL LOW (ref 3.5–5.0)
Alkaline Phosphatase: 52 U/L (ref 38–126)
Bilirubin, Direct: 0.4 mg/dL — ABNORMAL HIGH (ref 0.0–0.2)
Indirect Bilirubin: 0.4 mg/dL (ref 0.3–0.9)
Total Bilirubin: 0.8 mg/dL (ref 0.3–1.2)
Total Protein: 6.6 g/dL (ref 6.5–8.1)

## 2022-11-19 LAB — AMMONIA: Ammonia: 44 umol/L — ABNORMAL HIGH (ref 9–35)

## 2022-11-19 MED ORDER — AMLODIPINE BESYLATE 5 MG PO TABS
5.0000 mg | ORAL_TABLET | Freq: Every day | ORAL | Status: DC
  Administered 2022-11-20 – 2022-11-24 (×5): 5 mg via ORAL
  Filled 2022-11-19 (×8): qty 1

## 2022-11-19 MED ORDER — ARIPIPRAZOLE 5 MG PO TABS
5.0000 mg | ORAL_TABLET | Freq: Every day | ORAL | Status: AC
  Administered 2022-11-19 – 2022-11-20 (×2): 5 mg via ORAL
  Filled 2022-11-19 (×4): qty 1

## 2022-11-19 MED ORDER — BUSPIRONE HCL 7.5 MG PO TABS
7.5000 mg | ORAL_TABLET | Freq: Three times a day (TID) | ORAL | Status: DC
  Administered 2022-11-19 – 2022-11-24 (×15): 7.5 mg via ORAL
  Filled 2022-11-19 (×22): qty 1

## 2022-11-19 MED ORDER — ARIPIPRAZOLE 10 MG PO TABS
10.0000 mg | ORAL_TABLET | Freq: Every day | ORAL | Status: DC
  Administered 2022-11-22 – 2022-11-24 (×3): 10 mg via ORAL
  Filled 2022-11-19 (×5): qty 1

## 2022-11-19 MED ORDER — ARIPIPRAZOLE 10 MG PO TABS: 10.0000 mg | ORAL_TABLET | Freq: Every day | ORAL | Status: DC

## 2022-11-19 MED ORDER — ARIPIPRAZOLE 15 MG PO TABS
7.5000 mg | ORAL_TABLET | Freq: Every day | ORAL | Status: AC
  Administered 2022-11-21: 7.5 mg via ORAL
  Filled 2022-11-19 (×2): qty 1

## 2022-11-19 MED ORDER — LORAZEPAM 1 MG PO TABS
1.0000 mg | ORAL_TABLET | Freq: Four times a day (QID) | ORAL | Status: AC | PRN
  Administered 2022-11-19 (×2): 1 mg via ORAL
  Filled 2022-11-19 (×2): qty 1

## 2022-11-19 MED ORDER — AMLODIPINE BESYLATE 5 MG PO TABS: 7.5000 mg | ORAL_TABLET | Freq: Every day | ORAL | Status: DC

## 2022-11-19 NOTE — [facility] Suicide Risk Assessment (Signed)
[facility] INPATIENT:  Family/Significant Other Suicide Prevention Education  Suicide Prevention Education:  Patient Refusal for Family/Significant Other Suicide Prevention Education: The patient [name] has refused to provide written consent for family/significant other to be provided Family/Significant Other Suicide Prevention Education during admission and/or prior to discharge.  Physician notified.  Corky Crafts 11/19/2022, 6:37 PM

## 2022-11-19 NOTE — [facility] Group Notes (Signed)
Adult Psychoeducational Group Note  Date:  11/19/2022 Time:  9:27 PM  Group Topic/Focus:  Recovery Goals:   The focus of this group is to identify appropriate goals for recovery and establish a plan to achieve them.  Participation Level:  Did Not Attend  Participation Quality:    Affect:    Cognitive:    Insight:   Engagement in Group:    Modes of Intervention:    Additional Comments:    Clista Rainford A Caelynn Marshman-McCall 11/19/2022, 9:27 PM

## 2022-11-19 NOTE — Group Note (Unsigned)
Date:  11/19/2022 Time:  12:07 PM  Group Topic/Focus:  Goals Group:   The focus of this group is to help patients establish daily goals to achieve during treatment and discuss how the patient can incorporate goal setting into their daily lives to aide in recovery. Orientation:   The focus of this group is to educate the patient on the purpose and policies of crisis stabilization and provide a format to answer questions about their admission.  The group details unit policies and expectations of patients while admitted.     Participation Level:  {[facility] PARTICIPATION LEVEL:22264}  Participation Quality:  {[facility] PARTICIPATION QUALITY:22265}  Affect:  {[facility] AFFECT:22266}  Cognitive:  {[facility] COGNITIVE:22267}  Insight: {[facility] Insight2:20797}  Engagement in Group:  {[facility] ENGAGEMENT IN GROUP:22268}  Modes of Intervention:  {[facility] MODES OF INTERVENTION:22269}  Additional Comments:  ***  [name] 11/19/2022, 12:07 PM  
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
## 2022-11-19 NOTE — Progress Notes (Signed)
   11/18/22 2130  Psych Admission Type (Psych Patients Only)  Admission Status Voluntary  Psychosocial Assessment  Patient Complaints Anxiety;Depression  Eye Contact Fair  Facial Expression Anxious  Affect Anxious  Speech Logical/coherent  Interaction Assertive;Minimal  Motor Activity Fidgety  Appearance/Hygiene In scrubs  Behavior Characteristics Cooperative;Calm  Mood Depressed;Anxious  Thought Process  Coherency WDL  Content WDL  Delusions None reported or observed  Perception WDL  Hallucination None reported or observed  Judgment Impaired  Confusion None  Danger to Self  Current suicidal ideation? Passive  Self-Injurious Behavior No self-injurious ideation or behavior indicators observed or expressed   Agreement Not to Harm Self Yes  Description of Agreement Verbal contract for safety  Danger to Others  Danger to Others None reported or observed   Pt reports 8/10 anxiety and 10/10 depression. Pt was offered support and encouragement. Pt was given scheduled medications and PRN Atarax per MAR. Pt did not attend group. Pt minimally interactive on the unit, remained in room sleeping. Q 15 minute checks were done for safety.  Pt has no complaints. Pt receptive to treatment and safety maintained on unit.
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
## 2022-11-19 NOTE — Group Note (Signed)
Recreation Therapy Group Note   Group Topic:Team Building  Group Date: 11/19/2022 Start Time: 0933 End Time: 1002 Facilitators: Jaton Eilers-McCall, LRT,CTRS Location: [address] Dayroom   Goal Area(s) Addresses:  Patient will effectively work with peer towards shared goal.  Patient will identify skills used to make activity successful.  Patient will share challenges and verbalize solution-driven approaches used. Patient will identify how skills used during activity can be used to reach post d/c goals.    Group Description: [employer]. Patients were provided the following materials: 5 drinking straws, 5 rubber bands, 5 paper clips, 2 index cards and 2 drinking cups. Using the provided materials patients were asked to build a launching mechanism to launch a ping pong ball across the room, approximately 10 feet. Patients were divided into teams of 3-5. Instructions required all materials be incorporated into the device, functionality of items left to the peer group's discretion.   Affect/Mood: N/A   Participation Level: Did not attend    Clinical Observations/Individualized Feedback:     Plan: Continue to engage patient in RT group sessions 2-3x/week.   Li Bobo-McCall, LRT,CTRS  11/19/2022 12:40 PM

## 2022-11-19 NOTE — Progress Notes (Signed)
   11/19/22 2120  Psych Admission Type (Psych Patients Only)  Admission Status Voluntary  Psychosocial Assessment  Patient Complaints Anxiety;Depression;Worrying  Eye Contact Fair  Facial Expression Sad  Affect Depressed  Speech Logical/coherent  Interaction Assertive;Minimal  Motor Activity Tremors  Appearance/Hygiene In scrubs  Behavior Characteristics Cooperative;Calm  Mood Depressed;Anxious  Thought Process  Coherency WDL  Content WDL  Delusions None reported or observed  Perception WDL  Hallucination None reported or observed  Judgment Impaired  Confusion None  Danger to Self  Current suicidal ideation? Passive  Self-Injurious Behavior No self-injurious ideation or behavior indicators observed or expressed   Agreement Not to Harm Self Yes  Description of Agreement Verbal contract for safety  Danger to Others  Danger to Others None reported or observed   Pt reports 8/10 anxiety and 10/10 depression. Pt was offered support and encouragement. Given scheduled medications and PRN Atarax, Imodium, Trazodone, and Ativan per MAR. Pt was encouraged to attend groups but did not attend group. Remained in room resting. Q 15 minute checks were done for safety. Minimally interactive with peers and staff. Pt has no complaints. Pt receptive to treatment and safety maintained on unit.

## 2022-11-19 NOTE — Progress Notes (Signed)
Pt reported passive SI this morning with no lethal plan expressed at this time. Pt verbally contracts for safety. Pt denies HI/AVH. Pt has remained isolative to their room for most of the day. Pt complains of withdrawal symptoms including headache, dizziness, sweating, and tremors. PRN Ativan given this morning for CIWA >8 per MD order. Pt has been  cooperative throughout the shift. RN provided support and encouragement to patient. Pt given scheduled medications as prescribed. Q15 min checks verified for safety. Patient verbally contracts for safety. Patient compliant with medications and treatment plan. Pt is safe on the unit.   11/19/22 1100  Psych Admission Type (Psych Patients Only)  Admission Status Voluntary  Psychosocial Assessment  Patient Complaints Anxiety;Depression;Substance abuse  Eye Contact Fair  Facial Expression Sad  Affect Anxious;Depressed  Speech Logical/coherent  Interaction Assertive;Minimal;Isolative  Motor Activity Slow  Appearance/Hygiene In scrubs  Behavior Characteristics Cooperative;Anxious  Mood Depressed;Anxious  Thought Process  Coherency WDL  Content WDL  Delusions None reported or observed  Perception WDL  Hallucination None reported or observed  Judgment Impaired  Confusion None  Danger to Self  Current suicidal ideation? Passive  Self-Injurious Behavior Some self-injurious ideation observed or expressed.  No lethal plan expressed   Agreement Not to Harm Self Yes  Description of Agreement Pt verbally contracts for safety  Danger to Others  Danger to Others None reported or observed

## 2022-11-19 NOTE — BH IP Treatment Plan (Signed)
Interdisciplinary Treatment and Diagnostic Plan Update  11/19/2022 Time of Session: 1040 [name] MRN: [id number]  Principal Diagnosis: Bipolar I disorder, current or most recent episode depressed, with psychotic features (HCC)  Secondary Diagnoses: Principal Problem:   Bipolar I disorder, current or most recent episode depressed, with psychotic features (HCC) Active Problems:   Alcohol use disorder, severe, dependence (HCC)   PTSD (post-traumatic stress disorder)   Cocaine dependence syndrome (HCC)   Substance induced mood disorder (HCC)   GAD (generalized anxiety disorder)   Current Medications:  Current Facility-Administered Medications  Medication Dose Route Frequency Provider Last Rate Last Admin   alum & mag hydroxide-simeth (MAALOX/MYLANTA) 200-200-20 MG/5ML suspension 30 mL  30 mL Oral Q4H PRN Onuoha, Chinwendu V, NP       [START ON 11/20/2022] amLODipine (NORVASC) tablet 5 mg  5 mg Oral Daily Massengill, Nathan, MD       ARIPiprazole (ABILIFY) tablet 5 mg  5 mg Oral Daily Massengill, Nathan, MD   5 mg at 11/19/22 1201   Followed by   Melene Muller ON 11/21/2022] ARIPiprazole (ABILIFY) tablet 7.5 mg  7.5 mg Oral Daily Massengill, Harrold Donath, MD       Followed by   Melene Muller ON 11/22/2022] ARIPiprazole (ABILIFY) tablet 10 mg  10 mg Oral Daily Massengill, Harrold Donath, MD       atorvastatin (LIPITOR) tablet 40 mg  40 mg Oral Daily Massengill, Harrold Donath, MD   40 mg at 11/19/22 0803   busPIRone (BUSPAR) tablet 7.5 mg  7.5 mg Oral TID Phineas Inches, MD   7.5 mg at 11/19/22 1201   cloNIDine (CATAPRES) tablet 0.1 mg  0.1 mg Oral Q4H PRN Massengill, Harrold Donath, MD   0.1 mg at 11/18/22 1227   divalproex (DEPAKOTE) DR tablet 500 mg  500 mg Oral Q12H Massengill, Harrold Donath, MD   500 mg at 11/19/22 5284   folic acid (FOLVITE) tablet 1 mg  1 mg Oral Daily Massengill, Nathan, MD   1 mg at 11/19/22 1324   gabapentin (NEURONTIN) capsule 300 mg  300 mg Oral Q8H Massengill, Harrold Donath, MD   300 mg at 11/19/22 4010    hydrOXYzine (ATARAX) tablet 25 mg  25 mg Oral Q6H PRN Phineas Inches, MD   25 mg at 11/18/22 2140   loperamide (IMODIUM) capsule 2-4 mg  2-4 mg Oral PRN Onuoha, Chinwendu V, NP       LORazepam (ATIVAN) tablet 2 mg  2 mg Oral TID PRN Onuoha, Chinwendu V, NP       Or   LORazepam (ATIVAN) injection 2 mg  2 mg Intramuscular TID PRN Onuoha, Chinwendu V, NP       LORazepam (ATIVAN) injection 2 mg  2 mg Intramuscular TID PRN Massengill, Harrold Donath, MD       LORazepam (ATIVAN) tablet 1 mg  1 mg Oral TID Massengill, Harrold Donath, MD   1 mg at 11/19/22 1201   Followed by   Melene Muller ON 11/20/2022] LORazepam (ATIVAN) tablet 1 mg  1 mg Oral BID Massengill, Nathan, MD       Followed by   Melene Muller ON 11/21/2022] LORazepam (ATIVAN) tablet 1 mg  1 mg Oral Daily Massengill, Harrold Donath, MD       LORazepam (ATIVAN) tablet 1 mg  1 mg Oral Q6H PRN Massengill, Harrold Donath, MD   1 mg at 11/19/22 0935   magnesium hydroxide (MILK OF MAGNESIA) suspension 30 mL  30 mL Oral Daily PRN Onuoha, Chinwendu V, NP       multivitamin with minerals tablet 1 tablet  1 tablet Oral Daily Onuoha, Chinwendu V, NP   1 tablet at 11/19/22 0803   nicotine (NICODERM CQ - dosed in mg/24 hours) patch 21 mg  21 mg Transdermal Daily Massengill, Nathan, MD       thiamine (Vitamin B-1) tablet 100 mg  100 mg Oral Daily Massengill, Nathan, MD   100 mg at 11/19/22 0803   traZODone (DESYREL) tablet 50 mg  50 mg Oral QHS PRN Massengill, Harrold Donath, MD       PTA Medications: Medications Prior to Admission  Medication Sig Dispense Refill Last Dose   amLODipine (NORVASC) 5 MG tablet Take 1 tablet (5 mg total) by mouth daily. (Patient not taking: Reported on 11/17/2022) 30 tablet 0    ARIPiprazole (ABILIFY) 10 MG tablet Take 1 tablet (10 mg total) by mouth at bedtime. (Patient not taking: Reported on 11/17/2022) 30 tablet 0    atorvastatin (LIPITOR) 40 MG tablet Take 40 mg by mouth daily. (Patient not taking: Reported on 11/17/2022)      busPIRone (BUSPAR) 10 MG tablet Take 1 tablet (10  mg total) by mouth 3 (three) times daily. (Patient not taking: Reported on 11/17/2022) 90 tablet 0    chlordiazePOXIDE (LIBRIUM) 25 MG capsule 50mg  PO TID x 1D, then 25-50mg  PO BID X 1D, then 25-50mg  PO QD X 1D (Patient not taking: Reported on 11/17/2022) 12 capsule 0    folic acid (FOLVITE) 1 MG tablet Take 1 tablet (1 mg total) by mouth daily. (Patient not taking: Reported on 11/17/2022)      gabapentin (NEURONTIN) 300 MG capsule Take 1 capsule (300 mg total) by mouth 3 (three) times daily. (Patient not taking: Reported on 11/17/2022) 90 capsule 0    hydrOXYzine (ATARAX) 50 MG tablet Take 50 mg by mouth 3 (three) times daily. (Patient not taking: Reported on 11/17/2022)      lamoTRIgine (LAMICTAL) 25 MG tablet Take 1 tablet (25 mg total) by mouth daily. (Patient not taking: Reported on 11/17/2022) 30 tablet 0    LORazepam (ATIVAN) 1 MG tablet Take 1 mg by mouth every 8 (eight) hours as needed for anxiety. (Patient not taking: Reported on 11/17/2022)      magnesium oxide (MAG-OX) 400 (240 Mg) MG tablet Take 1 tablet (400 mg total) by mouth 2 (two) times daily. (Patient not taking: Reported on 11/17/2022)      meclizine (ANTIVERT) 25 MG tablet Take 1 tablet (25 mg total) by mouth 3 (three) times daily as needed for dizziness. (Patient not taking: Reported on 11/17/2022) 30 tablet 0    nicotine (NICODERM CQ - DOSED IN MG/24 HR) 7 mg/24hr patch Place 1 patch (7 mg total) onto the skin daily as needed (tobacco withdrawal). (Patient not taking: Reported on 11/17/2022) 28 patch 0    thiamine (VITAMIN B-1) 100 MG tablet Take 1 tablet (100 mg total) by mouth daily. (Patient not taking: Reported on 11/17/2022)      TYLENOL 500 MG tablet Take 500-1,000 mg by mouth every 6 (six) hours as needed (for headaches).       Patient Stressors:  Homelessness  Patient Strengths:  Medical laboratory scientific officer Modalities: Medication Management, Group therapy, Case management,  1 to 1 session with clinician, Psychoeducation, Recreational  therapy.   Physician Treatment Plan for Primary Diagnosis: Bipolar I disorder, current or most recent episode depressed, with psychotic features (HCC) Long Term Goal(s): Improvement in symptoms so as ready for discharge   Short Term Goals: Ability to identify changes in lifestyle to reduce recurrence  of condition will improve Ability to verbalize feelings will improve Ability to disclose and discuss suicidal ideas Ability to demonstrate self-control will improve Ability to identify and develop effective coping behaviors will improve Ability to maintain clinical measurements within normal limits will improve Compliance with prescribed medications will improve Ability to identify triggers associated with substance abuse/mental health issues will improve  Medication Management: Evaluate patient's response, side effects, and tolerance of medication regimen.  Therapeutic Interventions: 1 to 1 sessions, Unit Group sessions and Medication administration.  Evaluation of Outcomes: Progressing  Physician Treatment Plan for Secondary Diagnosis: Principal Problem:   Bipolar I disorder, current or most recent episode depressed, with psychotic features (HCC) Active Problems:   Alcohol use disorder, severe, dependence (HCC)   PTSD (post-traumatic stress disorder)   Cocaine dependence syndrome (HCC)   Substance induced mood disorder (HCC)   GAD (generalized anxiety disorder)  Long Term Goal(s): Improvement in symptoms so as ready for discharge   Short Term Goals: Ability to identify changes in lifestyle to reduce recurrence of condition will improve Ability to verbalize feelings will improve Ability to disclose and discuss suicidal ideas Ability to demonstrate self-control will improve Ability to identify and develop effective coping behaviors will improve Ability to maintain clinical measurements within normal limits will improve Compliance with prescribed medications will improve Ability to  identify triggers associated with substance abuse/mental health issues will improve     Medication Management: Evaluate patient's response, side effects, and tolerance of medication regimen.  Therapeutic Interventions: 1 to 1 sessions, Unit Group sessions and Medication administration.  Evaluation of Outcomes: Progressing   RN Treatment Plan for Primary Diagnosis: Bipolar I disorder, current or most recent episode depressed, with psychotic features (HCC) Long Term Goal(s): Knowledge of disease and therapeutic regimen to maintain health will improve  Short Term Goals: Ability to remain free from injury will improve, Ability to verbalize frustration and anger appropriately will improve, Ability to demonstrate self-control, Ability to participate in decision making will improve, Ability to verbalize feelings will improve, Ability to disclose and discuss suicidal ideas, Ability to identify and develop effective coping behaviors will improve, and Compliance with prescribed medications will improve  Medication Management: RN will administer medications as ordered by provider, will assess and evaluate patient's response and provide education to patient for prescribed medication. RN will report any adverse and/or side effects to prescribing provider.  Therapeutic Interventions: 1 on 1 counseling sessions, Psychoeducation, Medication administration, Evaluate responses to treatment, Monitor vital signs and CBGs as ordered, Perform/monitor CIWA, COWS, AIMS and Fall Risk screenings as ordered, Perform wound care treatments as ordered.  Evaluation of Outcomes: Progressing   LCSW Treatment Plan for Primary Diagnosis: Bipolar I disorder, current or most recent episode depressed, with psychotic features (HCC) Long Term Goal(s): Safe transition to appropriate next level of care at discharge, Engage patient in therapeutic group addressing interpersonal concerns.  Short Term Goals: Engage patient in aftercare  planning with referrals and resources, Increase social support, Increase ability to appropriately verbalize feelings, Increase emotional regulation, Facilitate acceptance of mental health diagnosis and concerns, Facilitate patient progression through stages of change regarding substance use diagnoses and concerns, Identify triggers associated with mental health/substance abuse issues, and Increase skills for wellness and recovery  Therapeutic Interventions: Assess for all discharge needs, 1 to 1 time with Social worker, Explore available resources and support systems, Assess for adequacy in community support network, Educate family and significant other(s) on suicide prevention, Complete Psychosocial Assessment, Interpersonal group therapy.  Evaluation of Outcomes:  Progressing   Progress in Treatment: Attending groups: Yes. Participating in groups: Yes. Taking medication as prescribed: Yes. Toleration medication: Yes. Family/Significant other contact made: No will contact Family Member Patient understands diagnosis: Yes. Discussing patient identified problems/goals with staff: Yes. Medical problems stabilized or resolved: Yes. Denies suicidal/homicidal ideation: Yes. Issues/concerns per patient self-inventory: Yes. Other: N/A  New problem(s) identified: No, Describe:  None Reported  New Short Term/Long Term Goal(s):medication stabilization, elimination of SI thoughts, development of comprehensive mental wellness plan.    Patient Goals:  Medication Stabilization  Discharge Plan or Barriers: : Patient recently admitted. CSW will continue to follow and assess for appropriate referrals and possible discharge planning.      Reason for Continuation of Hospitalization: Anxiety Depression Hallucinations Medication stabilization Suicidal ideation Withdrawal symptoms  Estimated Length of Stay: 3-7 Days  Last 3 [place] Suicide Severity Risk Score: Flowsheet Row Ad[facility] (Current) [facility]  [facility] 11[facility]17/2022 [facility] at [facility] ED from 07/10/2022 in [facility] Emergency Department at [facility]  C-SSRS RISK CATEGORY Moderate Risk High Risk No Risk       Last [facility] 2/9 Scores:    06/25/2022   11:51 AM 02/11/2022    3:38 PM 08/05/2021   11:22 AM  Depression screen PHQ 2/9  Decreased Interest 0 0 2  Down, Depressed, Hopeless 0 0 3  PHQ - 2 Score 0 0 5  Altered sleeping 0  3  Tired, decreased energy 0  2  Change in appetite 0  1  Feeling bad or failure about yourself  0  1  Trouble concentrating 0  1  Moving slowly or fidgety/restless 0  1  Suicidal thoughts 0  0  PHQ-9 Score 0  14  Difficult doing work/chores Not difficult at all  Extremely dIfficult   detox, medication management for mood stabilization; elimination of SI thoughts; development of comprehensive mental wellness/sobriety   Scribe for Treatment Team: Ane Payment, LCSW 11/19/2022 1:44 PM

## 2022-11-20 DIAGNOSIS — F315 Bipolar disorder, current episode depressed, severe, with psychotic features: Secondary | ICD-10-CM | POA: Diagnosis not present

## 2022-11-20 MED ORDER — ONDANSETRON HCL 4 MG PO TABS
4.0000 mg | ORAL_TABLET | Freq: Three times a day (TID) | ORAL | Status: DC | PRN
  Administered 2022-11-20: 4 mg via ORAL
  Filled 2022-11-20: qty 1

## 2022-11-20 NOTE — [facility] Group Notes (Signed)
Adult Psychoeducational Group Note  Date:  11/20/2022 Time:  1:52 PM  Group Topic/Focus:  Emotional Education:   The focus of this group is to discuss what feelings/emotions are, and how they are experienced.  Participation Level:  Did Not Attend  Participation Quality:    Affect:    Cognitive:    Insight:   Engagement in Group:    Modes of Intervention:    Additional Comments:    Oneida Mckamey A Saydie Gerdts-McCall 11/20/2022, 1:52 PM

## 2022-11-20 NOTE — Progress Notes (Signed)
Patient agreed to take medications and have vitals taken, MD notified.

## 2022-11-20 NOTE — Progress Notes (Signed)
Patient refused morning vital signs this morning. Patient refused to eat breakfast or take morning medications despite RN encouragement.

## 2022-11-20 NOTE — Progress Notes (Addendum)
RN attempted to have patient take PO Ativan this morning per [facility]. Patient refused despite encouragement from RN. RN educated patient about importance of taking Ativan to prevent seizure. Pt stated "I ain't gonna have no seizure. I feel fine. If I have a seizure maybe I'll be able to get out of here". RN notified Dr Hazle Quant, MD, received verbal order to attempt to have patient take PO Ativan again at 1200 and report back.

## 2022-11-20 NOTE — [facility] Group Notes (Signed)
Adult Psychoeducational Group Note  Date:  11/20/2022 Time:  9:08 AM  Group Topic/Focus:  Orientation:   The focus of this group is to educate the patient on the purpose and policies of crisis stabilization and provide a format to answer questions about their admission.  The group details unit policies and expectations of patients while admitted.  Participation Level:  Did Not Attend  Participation Quality:    Affect:    Cognitive:    Insight:   Engagement in Group:    Modes of Intervention:    Additional Comments:    Karima Carrell A Renda Pohlman-McCall 11/20/2022, 9:08 AM

## 2022-11-20 NOTE — Progress Notes (Signed)
   11/20/22 2125  Psych Admission Type (Psych Patients Only)  Admission Status Voluntary  Psychosocial Assessment  Patient Complaints Anxiety;Depression;Substance abuse  Eye Contact Brief  Facial Expression Flat  Affect Depressed  Speech Logical/coherent  Interaction Isolative;Minimal  Motor Activity Tremors  Appearance/Hygiene Unremarkable  Behavior Characteristics Cooperative;Calm  Mood Depressed;Anxious  Thought Process  Coherency WDL  Content WDL  Delusions None reported or observed  Perception WDL  Hallucination None reported or observed  Judgment Impaired  Confusion None  Danger to Self  Current suicidal ideation? Denies  Self-Injurious Behavior No self-injurious ideation or behavior indicators observed or expressed   Agreement Not to Harm Self Yes  Description of Agreement Verbal contract for safety  Danger to Others  Danger to Others None reported or observed   Pt reports 7/10 anxiety and 6/10 depression. Pt was offered support and encouragement. Reported having a decreased appetite. Encouraged pt to attend group but pt did not attend group. Pt isolative and remained in room sleeping. Pt was given scheduled medications. PRN Atarax and Trazodone given per MAR. Q 15 minute checks were done for safety. Pt has no complaints.Pt receptive to treatment and safety maintained on unit.
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
## 2022-11-20 NOTE — Progress Notes (Signed)
Patient allowed MHT to take vital signs following encouragement but continued to refuse morning medications stating "I don't want to take no medications".

## 2022-11-20 NOTE — Progress Notes (Addendum)
Pt has been isolative to her room throughout the day. Patient has been resistant to care, refusing vital signs and medications despite numerous attempts by RN and medical staff to encourage compliance. Patient repeatedly states that she feels fine and just wants to leave the facility.    11/20/22 0900  Psych Admission Type (Psych Patients Only)  Admission Status Voluntary  Psychosocial Assessment  Patient Complaints Substance abuse  Eye Contact Avoids  Facial Expression Flat  Affect Depressed;Irritable  Speech Logical/coherent  Interaction Minimal;Isolative  Motor Activity Tremors  Appearance/Hygiene Unremarkable  Behavior Characteristics Irritable;Resistant to care  Mood Depressed;Irritable  Thought Process  Coherency WDL  Content WDL  Delusions None reported or observed  Perception WDL  Hallucination None reported or observed  Judgment Impaired  Confusion None  Danger to Self  Current suicidal ideation? Passive  Self-Injurious Behavior Some self-injurious ideation observed or expressed.  No lethal plan expressed   Agreement Not to Harm Self Yes  Description of Agreement Pt verbally contracts for safety  Danger to Others  Danger to Others None reported or observed

## 2022-11-21 DIAGNOSIS — F315 Bipolar disorder, current episode depressed, severe, with psychotic features: Secondary | ICD-10-CM | POA: Diagnosis not present

## 2022-11-21 LAB — HEPATIC FUNCTION PANEL
ALT: 69 U/L — ABNORMAL HIGH (ref 0–44)
AST: 80 U/L — ABNORMAL HIGH (ref 15–41)
Albumin: 3.4 g/dL — ABNORMAL LOW (ref 3.5–5.0)
Alkaline Phosphatase: 51 U/L (ref 38–126)
Bilirubin, Direct: 0.1 mg/dL (ref 0.0–0.2)
Indirect Bilirubin: 0.5 mg/dL (ref 0.3–0.9)
Total Bilirubin: 0.6 mg/dL (ref 0.3–1.2)
Total Protein: 7 g/dL (ref 6.5–8.1)

## 2022-11-21 LAB — AMMONIA: Ammonia: 60 umol/L — ABNORMAL HIGH (ref 9–35)

## 2022-11-21 IMAGING — CR DG SHOULDER 2+V*L*
3 series · 3 of 3 positions shown · non-contrast
Comparison: 01/11/2018

CLINICAL DATA: Chronic left shoulder pain. History of shoulder
surgery.

EXAM:
LEFT SHOULDER - 2+ VIEW

[shoulder grashey]
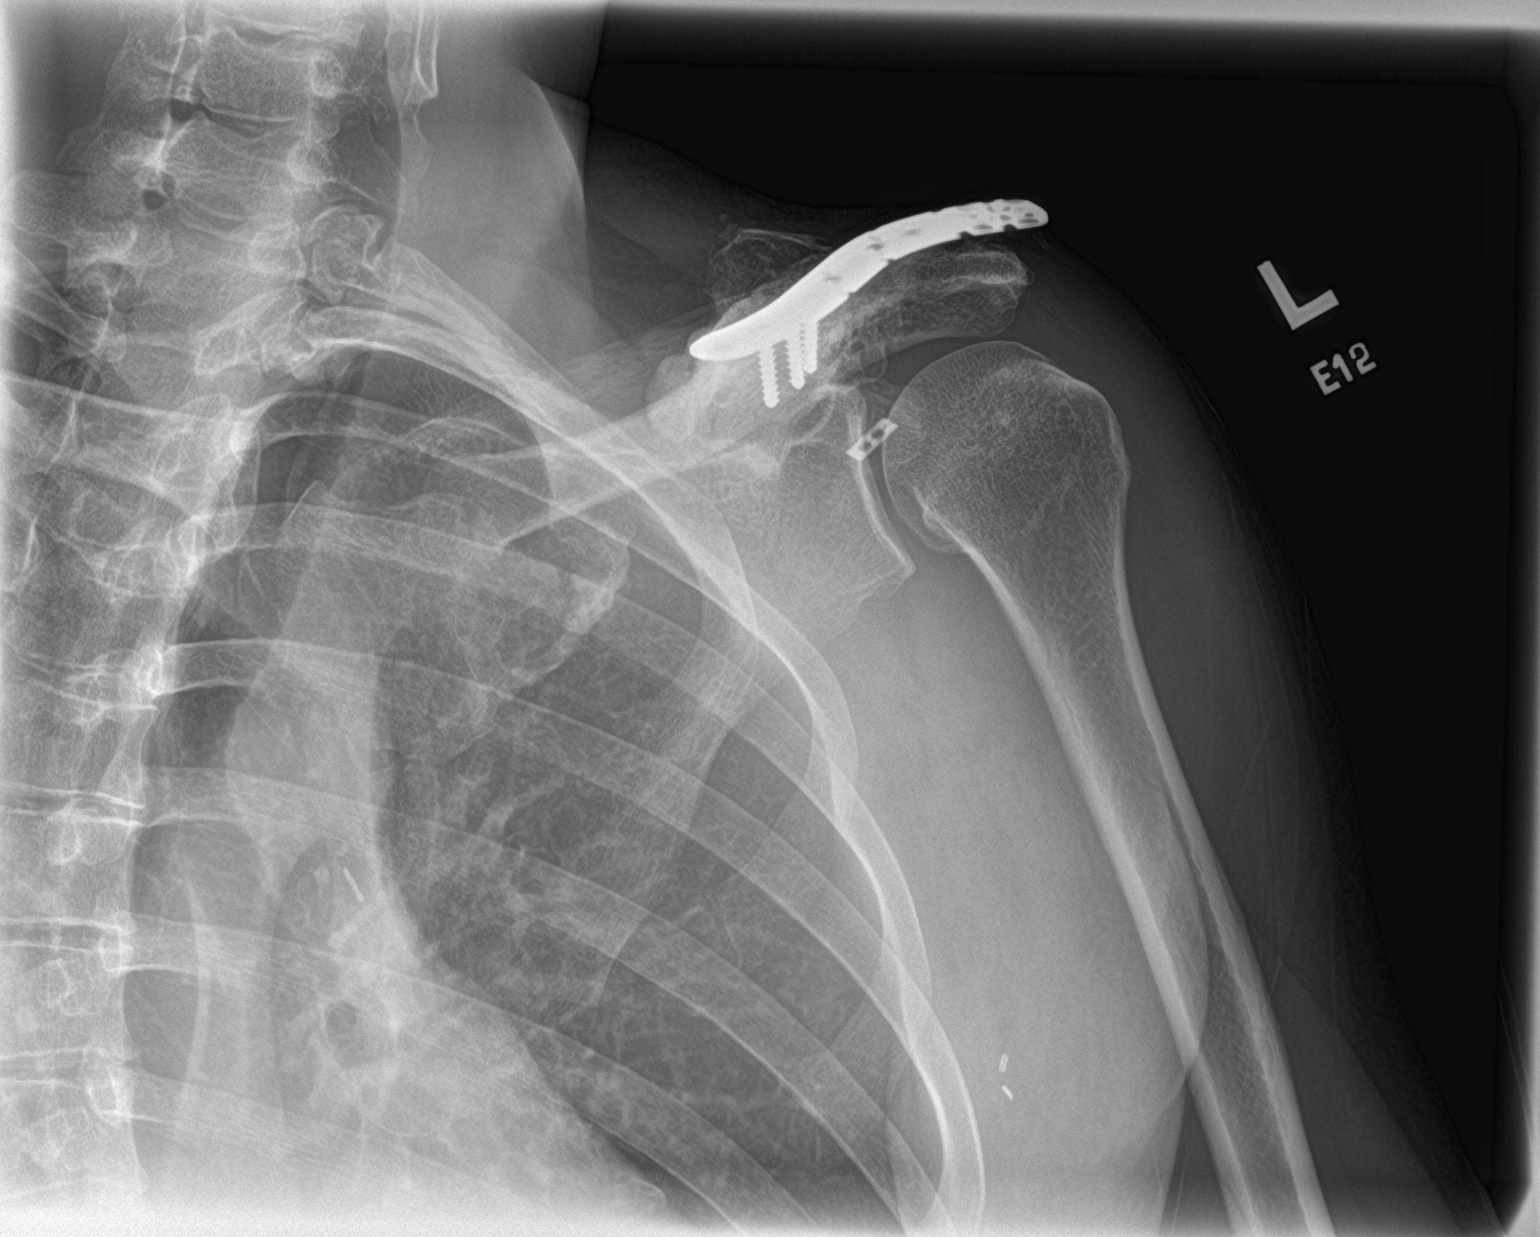

[shoulder y view]
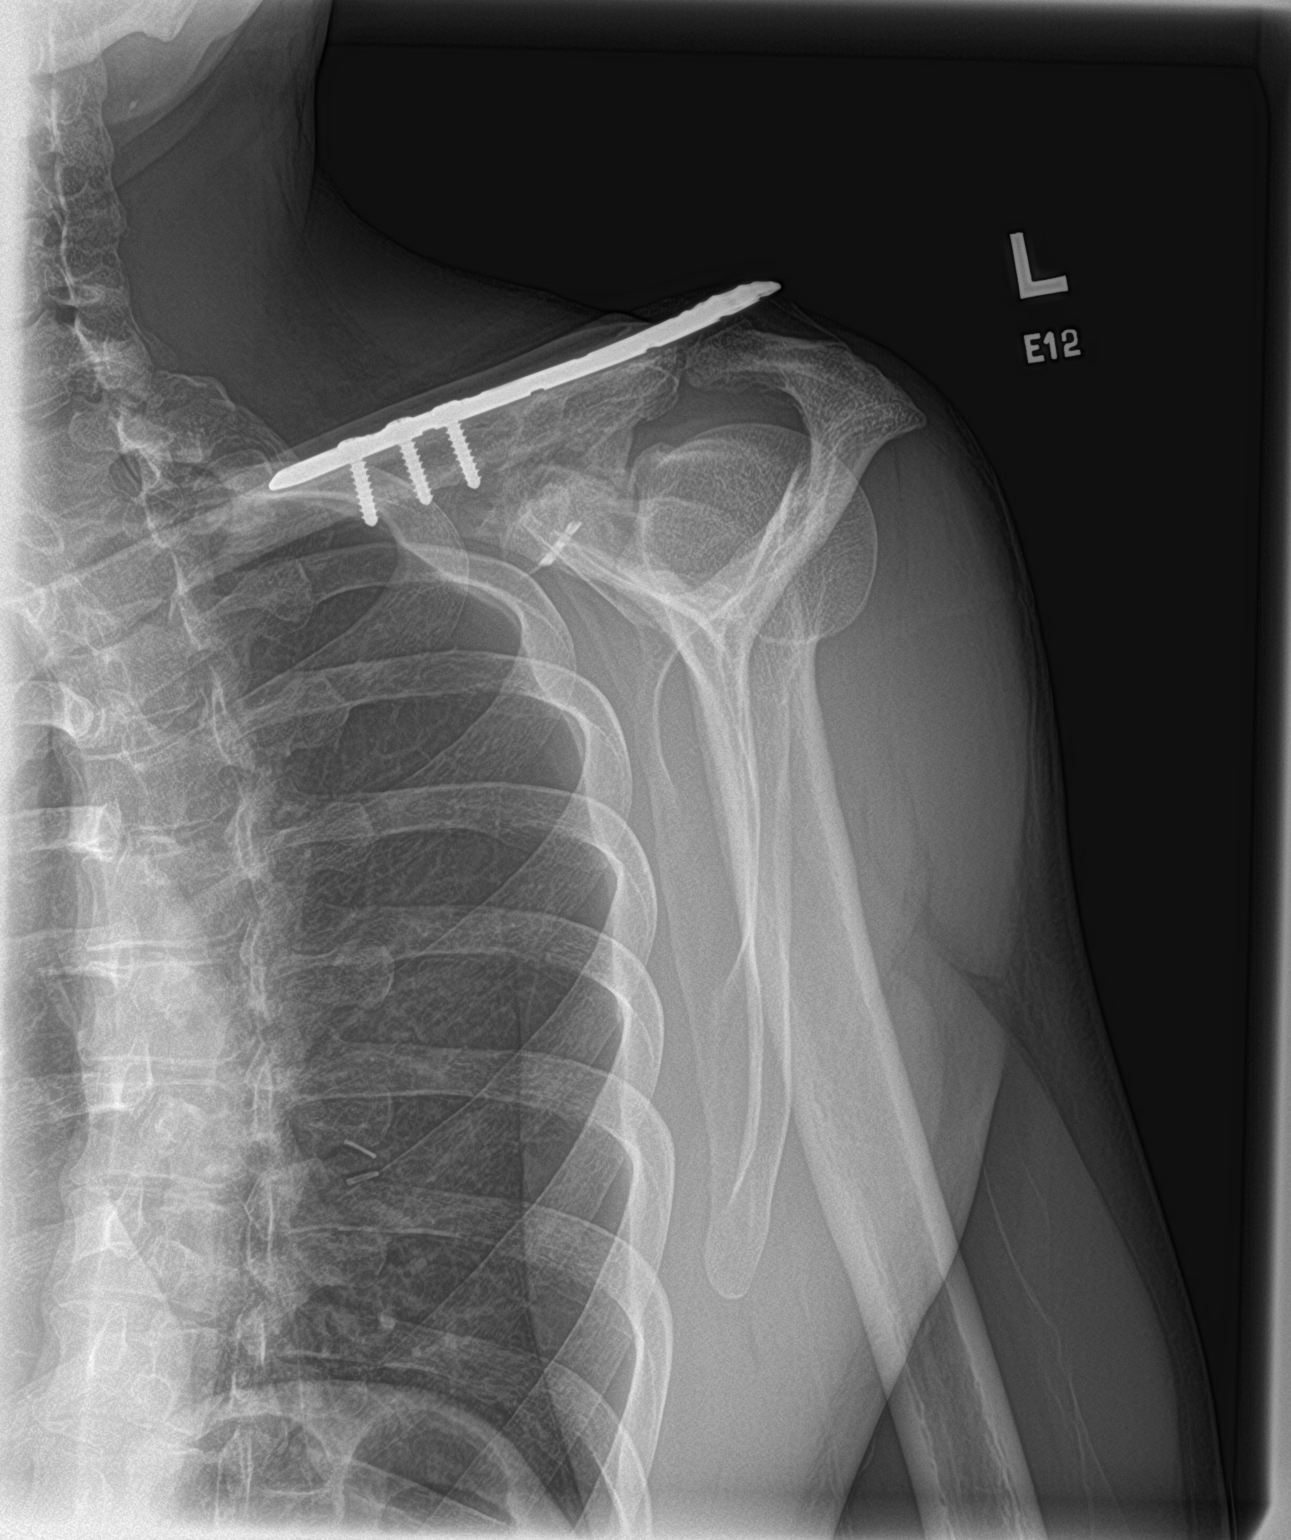

[shoulder axillary]
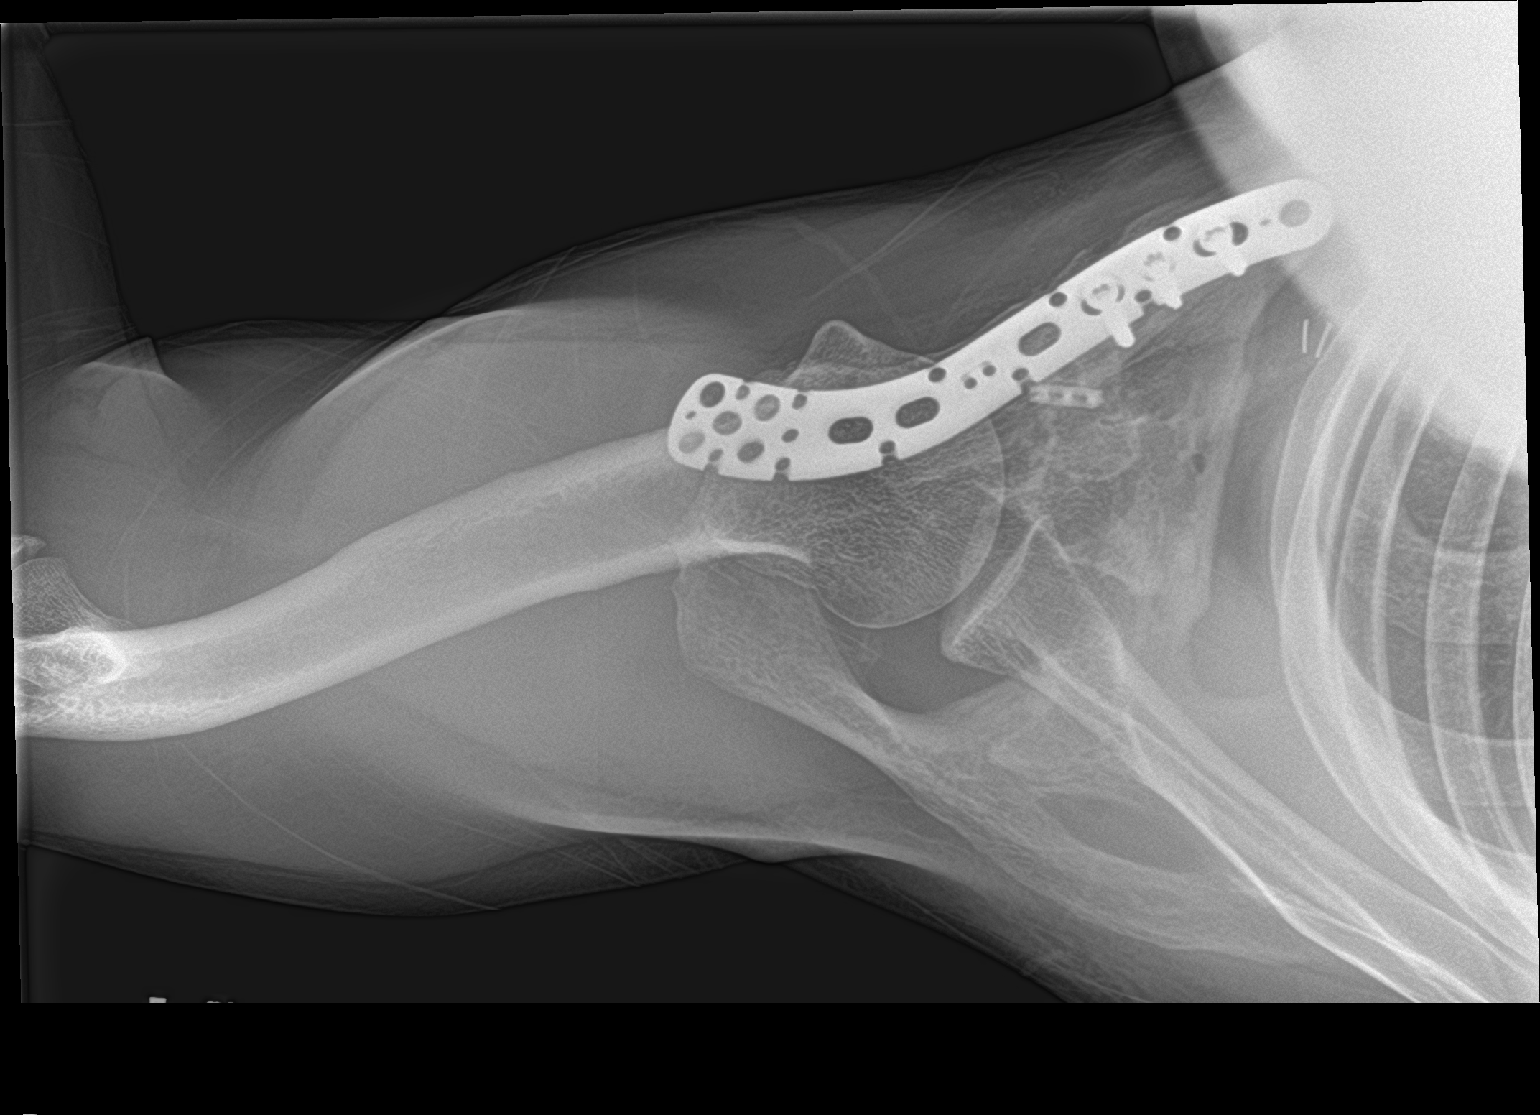

[3 of 3 positions shown; findings below may reference images not displayed]

FINDINGS: No acute fracture.  No dislocation.

Surgical fixation plate attached by 3 screws extends across the mid
to distal clavicle, metal plate crossing the AC joint to overlie the
chromium. Orthopedic hardware appears well seated, without evidence
of loosening.

Small surgical plate projects along the inferior margin of the
coracoid process, also unchanged.

Glenohumeral joint normally spaced and aligned. AC joint is normally
aligned.

Distal aspect the superior clavicle hardware protrudes above the a
chromium bulging the overlying soft tissues superiorly.
IMPRESSION: 1. No acute fracture or dislocation.
2. Stable changes from the previous ORIF the left clavicle and AC
joint. No evidence of loosening of the orthopedic hardware.

## 2022-11-21 MED ORDER — ACETAMINOPHEN 325 MG PO TABS
650.0000 mg | ORAL_TABLET | Freq: Four times a day (QID) | ORAL | Status: DC | PRN
  Administered 2022-11-23: 650 mg via ORAL
  Filled 2022-11-21: qty 2

## 2022-11-21 MED ORDER — TRAZODONE HCL 100 MG PO TABS
100.0000 mg | ORAL_TABLET | Freq: Every day | ORAL | Status: DC
  Administered 2022-11-21 – 2022-11-23 (×3): 100 mg via ORAL
  Filled 2022-11-21 (×5): qty 1
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
## 2022-11-21 NOTE — Group Note (Signed)
Date:  11/21/2022 Time:  6:46 PM  Group Topic/Focus:  Dimensions of Wellness:   The focus of this group is to introduce the topic of wellness and discuss the role each dimension of wellness plays in total health.    Participation Level:  Did Not Attend  Participation Quality:   n/a  Affect:   n/a  Cognitive:   n/a  Insight: None  Engagement in Group:   n/a  Modes of Intervention:   n/a  Additional Comments:   Pt did not attend.  Edmund Hilda Aida Lemaire 11/21/2022, 6:46 PM

## 2022-11-21 NOTE — Progress Notes (Signed)
   11/21/22 2120  Psych Admission Type (Psych Patients Only)  Admission Status Voluntary  Psychosocial Assessment  Patient Complaints Anxiety;Depression;Substance abuse  Eye Contact Fair  Facial Expression Flat  Affect Depressed  Speech Logical/coherent  Interaction Assertive  Motor Activity Tremors;Slow  Appearance/Hygiene Unremarkable  Behavior Characteristics Cooperative;Calm  Mood Depressed;Anxious  Thought Process  Coherency WDL  Content WDL  Delusions None reported or observed  Perception WDL  Hallucination None reported or observed  Judgment Impaired  Confusion None  Danger to Self  Current suicidal ideation? Denies  Self-Injurious Behavior No self-injurious ideation or behavior indicators observed or expressed   Agreement Not to Harm Self Yes  Description of Agreement Verbal contract for safety  Danger to Others  Danger to Others None reported or observed   Pt reports 5/10 anxiety and 4/10 depression. Pt was offered support and encouragement. Pt more interactive and active this shift. Patient visible in the milieu interacting well with peers and staff. Attended group. Q 15 minute checks were done for safety. Given scheduled medications and PRN Atarax per MAR. Pt has no complaints.Pt receptive to treatment and safety maintained on unit.

## 2022-11-21 NOTE — Group Note (Signed)
Date:  11/21/2022 Time:  4:24 PM  Group Topic/Focus:  Dimensions of Wellness:   The focus of this group is to introduce the topic of wellness and discuss the role each dimension of wellness plays in total health.    Participation Level:  Did Not Attend  Participation Quality:   n/a  Affect:   n/a  Cognitive:   n/a  Insight: None  Engagement in Group:   n/a  Modes of Intervention:   n/a  Additional Comments:   Pt did not attend.  Edmund Hilda Rendon Howell 11/21/2022, 4:24 PM

## 2022-11-21 NOTE — Group Note (Signed)
Date:  11/21/2022 Time:  6:17 PM  Group Topic/Focus:  Dimensions of Wellness:   The focus of this group is to introduce the topic of wellness and discuss the role each dimension of wellness plays in total health.    Participation Level:  Did Not Attend  Participation Quality:   n/a  Affect:   n/a  Cognitive:   n/a  Insight: None  Engagement in Group:   n/a  Modes of Intervention:   n/a  Additional Comments:   Pt did not attend.  Edmund Hilda Lliam Hoh 11/21/2022, 6:17 PM

## 2022-11-21 NOTE — [facility] Group Notes (Signed)
Adult Psychoeducational Group Note  Date:  11/21/2022 Time:  9:34 PM  Group Topic/Focus:  Wrap-Up Group:   The focus of this group is to help patients review their daily goal of treatment and discuss progress on daily workbooks.  Participation Level:  Active  Participation Quality:  Appropriate  Affect:  Appropriate  Cognitive:  Appropriate  Insight: Appropriate  Engagement in Group:  Engaged  Modes of Intervention:  Discussion and Support  Additional Comments:  Pt attended the evening group and responded to all discussion prompts from the Writer. Pt shared that today was a good day on the unit, the highlight of which was spending time in the dayroom and not isolating in her room. On the subject of goals for the coming week, Pt mentioned "wanting to talk to my Mom and figure out what's next. I'm starting a new chapter." Pt rated today a 6 out of 10.  Christ Kick 11/21/2022, 9:34 PM

## 2022-11-21 NOTE — Group Note (Signed)
Date:  11/21/2022 Time:  9:55 AM  Group Topic/Focus:  Goals Group:   The focus of this group is to help patients establish daily goals to achieve during treatment and discuss how the patient can incorporate goal setting into their daily lives to aide in recovery. Orientation:   The focus of this group is to educate the patient on the purpose and policies of crisis stabilization and provide a format to answer questions about their admission.  The group details unit policies and expectations of patients while admitted.    Participation Level:  Did Not Attend  Participation Quality:   n/a  Affect:   n/a  Cognitive:   n/a  Insight: None  Engagement in Group:   n/a  Modes of Intervention:   n/a  Additional Comments:   Pt did not attend.  Edmund Hilda Betsie Peckman 11/21/2022, 9:55 AM

## 2022-11-21 NOTE — Progress Notes (Addendum)
Patient has been isolative to room for entire day. Patient compliant with medications and treatment. Patient continues to complain of sweating, headache, and tremors. No PRNs required. Patient reports poor appetite today but is observed eating meals throughout the day. Pt observed leaving room and going down to dinner this evening. Pt given scheduled medications as prescribed. Q15 min checks verified for safety. Patient verbally contracts for safety. Patient compliant with medications and treatment plan. Pt is safe on the unit.   11/21/22 0900  Psych Admission Type (Psych Patients Only)  Admission Status Voluntary  Psychosocial Assessment  Patient Complaints Anxiety;Depression;Substance abuse  Eye Contact Avoids  Facial Expression Flat;Sad  Affect Depressed  Speech Logical/coherent  Interaction Minimal;Isolative  Motor Activity Tremors;Slow  Appearance/Hygiene Unremarkable  Behavior Characteristics Calm  Mood Depressed;Sad  Thought Process  Coherency WDL  Content WDL  Delusions None reported or observed  Perception WDL  Hallucination None reported or observed  Judgment Poor  Confusion None  Danger to Self  Current suicidal ideation? Passive  Self-Injurious Behavior Some self-injurious ideation observed or expressed.  No lethal plan expressed   Agreement Not to Harm Self Yes  Description of Agreement Pt verbally contracts for safety  Danger to Others  Danger to Others None reported or observed

## 2022-11-22 NOTE — Group Note (Signed)
Recreation Therapy Group Note   Group Topic:Problem Solving  Group Date: 11/22/2022 Start Time: 0932 End Time: 0956 Facilitators: Naijah Lacek-McCall, LRT,CTRS Location: [address] Dayroom   Goal Area(s) Addresses:  Patient will effectively work with peer towards shared goal.  Patient will identify skills used to make activity successful.  Patient will identify how skills used during activity can be applied to reach post d/c goals.   Group Description: [employer]. In teams of 5-6, patients were given 12 craft pipe cleaners. Using the materials provided, patients were instructed to compete again the opposing team(s) to build the tallest free-standing structure from floor level. The activity was timed; difficulty increased by Clinical research associate as Production designer, theatre/television/film continued.  Systematically resources were removed with additional directions for example, placing one arm behind their back, working in silence, and shape stipulations. LRT facilitated post-activity discussion reviewing team processes and necessary communication skills involved in completion. Patients were encouraged to reflect how the skills utilized, or not utilized, in this activity can be incorporated to positively impact support systems post discharge.   Affect/Mood: N/A   Participation Level: Did not attend    Clinical Observations/Individualized Feedback:     Plan: Continue to engage patient in RT group sessions 2-3x/week.   Jaydin Boniface-McCall, LRT,CTRS  11/22/2022 12:20 PM

## 2022-11-22 NOTE — [facility] Group Notes (Signed)
Spirituality group facilitated by Chaplain Katy Tonimarie Gritz, B[facility].   Group Description: Group focused on topic of hope. Patients participated in facilitated discussion around topic, connecting with one another around experiences and definitions for hope. Group members engaged with visual explorer photos, reflecting on what hope looks like for them today. Group engaged in discussion around how their definitions of hope are present today in hospital.   Modalities: Psycho-social ed, Adlerian, Narrative, MI   Patient Progress: Did not attend.  

## 2022-11-22 NOTE — Progress Notes (Signed)
[name] did not attend wrap up group
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
## 2022-11-22 NOTE — Progress Notes (Signed)
   11/22/22 2110  Psych Admission Type (Psych Patients Only)  Admission Status Voluntary  Psychosocial Assessment  Patient Complaints Anxiety;Depression  Eye Contact Fair  Facial Expression Flat  Affect Depressed;Anxious  Speech Logical/coherent  Interaction Assertive;Minimal  Motor Activity Slow  Appearance/Hygiene Unremarkable  Behavior Characteristics Cooperative;Calm  Mood Depressed;Anxious  Thought Process  Coherency WDL  Content WDL  Delusions None reported or observed  Perception WDL  Hallucination None reported or observed  Judgment Impaired  Confusion None  Danger to Self  Current suicidal ideation? Denies  Self-Injurious Behavior No self-injurious ideation or behavior indicators observed or expressed   Agreement Not to Harm Self Yes  Description of Agreement Verbal contract for safety  Danger to Others  Danger to Others None reported or observed   Pt reports 6/10 anxiety and 3/10 depression.  Pt was offered support and encouragement. Pt was given scheduled medications and PRN Maalox & Atarax per MAR. Pt minimally interactive with peers and staff. Pt was encouraged to attend groups. Pt did not attend group and remained in their room.Q 15 minute checks were done for safety. Pt has no complaints.Pt receptive to treatment and safety maintained on unit

## 2022-11-22 NOTE — Progress Notes (Signed)
Pt spends a lot of time in room. Pt endorses anxiety, denies other withdrawal symptoms. Pt denies SI/HI/AVH. Utilized hydroxyzine prn.

## 2022-11-23 NOTE — Progress Notes (Signed)
Pt in bed most of the morning. Pt did attend group this afternoon. Pt denies withdrawal symptoms. Pt denies suicidal thoughts. Q 15 minute checks ongoing.
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
## 2022-11-23 NOTE — Group Note (Signed)
LCSW Group Therapy Note  Group Date: 11/23/2022 Start Time: 1100 End Time: 1200   Type of Therapy and Topic:  Group Therapy - Healthy vs Unhealthy Coping Skills  Participation Level:  Did Not Attend   Description of Group The focus of this group was to determine what unhealthy coping techniques typically are used by group members and what healthy coping techniques would be helpful in coping with various problems. Patients were guided in becoming aware of the differences between healthy and unhealthy coping techniques. Patients were asked to identify 2-3 healthy coping skills they would like to learn to use more effectively.  Therapeutic Goals Patients learned that coping is what human beings do all day long to deal with various situations in their lives Patients defined and discussed healthy vs unhealthy coping techniques Patients identified their preferred coping techniques and identified whether these were healthy or unhealthy Patients determined 2-3 healthy coping skills they would like to become more familiar with and use more often. Patients provided support and ideas to each other   Summary of Patient Progress:  During group, [name] did not attend.   Therapeutic Modalities Cognitive Behavioral Therapy Motivational Interviewing  Ane Payment, LCSW 11/23/2022  3:46 PM

## 2022-11-23 NOTE — Group Note (Signed)
Recreation Therapy Group Note   Group Topic:Animal Assisted Therapy   Group Date: 11/23/2022 Start Time: 0945 End Time: 1035 Facilitators: Belal Scallon-McCall, LRT,CTRS Location: 300 Hall Dayroom   Animal-Assisted Activity (AAA) Program Checklist/Progress Notes Patient Eligibility Criteria Checklist & Daily Group note for Rec Tx Intervention  AAA/T Program Assumption of Risk Form signed by Patient/ or Parent Legal Guardian Yes  Patient understands his/her participation is voluntary Yes   Affect/Mood: N/A   Participation Level: Did not attend    Clinical Observations/Individualized Feedback:     Plan: Continue to engage patient in RT group sessions 2-3x/week.   [name], LRT,CTRS  11/23/2022 1:31 PM

## 2022-11-23 NOTE — Plan of Care (Signed)
  Problem: Education: Goal: Emotional status will improve Outcome: Progressing Goal: Mental status will improve Outcome: Progressing   Problem: Activity: Goal: Interest or engagement in activities will improve Outcome: Progressing   Problem: Coping: Goal: Ability to demonstrate self-control will improve Outcome: Progressing   

## 2022-11-23 NOTE — Progress Notes (Signed)
    11/23/22 2045  Psych Admission Type (Psych Patients Only)  Admission Status Voluntary  Psychosocial Assessment  Patient Complaints Anxiety  Eye Contact Fair  Facial Expression Flat  Affect Depressed;Anxious  Speech Logical/coherent  Interaction Assertive  Motor Activity Slow  Appearance/Hygiene Unremarkable  Behavior Characteristics Cooperative  Mood Depressed;Anxious  Thought Process  Coherency WDL  Content WDL  Delusions None reported or observed  Perception WDL  Hallucination None reported or observed  Judgment Impaired  Confusion None  Danger to Self  Current suicidal ideation? Denies  Self-Injurious Behavior No self-injurious ideation or behavior indicators observed or expressed   Agreement Not to Harm Self Yes  Description of Agreement verbal  Danger to Others  Danger to Others None reported or observed

## 2022-11-23 NOTE — Group Note (Signed)
Date:  11/23/2022 Time:  10:20 AM  Group Topic/Focus:  Goals Group:   The focus of this group is to help patients establish daily goals to achieve during treatment and discuss how the patient can incorporate goal setting into their daily lives to aide in recovery. Orientation:   The focus of this group is to educate the patient on the purpose and policies of crisis stabilization and provide a format to answer questions about their admission.  The group details unit policies and expectations of patients while admitted.    Participation Level:  Did Not Attend Additional Comments:  Patient was told multiple times that group was starting but still did not attend group.   Chanceler Pullin T Shyquan Stallbaumer 11/23/2022, 10:20 AM

## 2022-11-24 ENCOUNTER — Encounter (HOSPITAL_COMMUNITY): Payer: Self-pay

## 2022-11-24 MED ORDER — GABAPENTIN 300 MG PO CAPS: 300.0000 mg | ORAL_CAPSULE | Freq: Three times a day (TID) | ORAL | 0 refills | Status: DC

## 2022-11-24 MED ORDER — FOLIC ACID 1 MG PO TABS: 1.0000 mg | ORAL_TABLET | Freq: Every day | ORAL | 0 refills | Status: AC

## 2022-11-24 MED ORDER — FOLIC ACID 1 MG PO TABS
1.0000 mg | ORAL_TABLET | Freq: Every day | ORAL | Status: DC
  Administered 2022-11-24: 1 mg via ORAL
  Filled 2022-11-24 (×3): qty 1

## 2022-11-24 MED ORDER — DIVALPROEX SODIUM 500 MG PO DR TAB: 500.0000 mg | DELAYED_RELEASE_TABLET | Freq: Two times a day (BID) | ORAL | 0 refills | Status: DC

## 2022-11-24 NOTE — BHH Group Notes (Signed)
Spiritual care group on grief and loss facilitated by Chaplain Katy Faigy Stretch, Bcc  Group Goal: Support / Education around grief and loss  Members engage in facilitated group support and psycho-social education.  Group Description:  Following introductions and group rules, group members engaged in facilitated group dialogue and support around topic of loss, with particular support around experiences of loss in their lives. Group Identified types of loss (relationships / self / things) and identified patterns, circumstances, and changes that precipitate losses. Reflected on thoughts / feelings around loss, normalized grief responses, and recognized variety in grief experience. Group encouraged individual reflection on safe space and on the coping skills that they are already utilizing.  Group drew on Adlerian / Rogerian and narrative framework  Patient Progress: Did not attend.  

## 2022-11-24 NOTE — Progress Notes (Signed)
Patient discharged to home via private car. Denis SI/HI, AVH. Verbalized understanding of discharger information. Retrieved all belongings at bedside/in locker.

## 2022-11-24 NOTE — Progress Notes (Signed)
  [facility] Adult Case Management Discharge Plan :  Will you be returning to the same living situation after discharge:  No.Pt plans to stay at a hotel At discharge, do you have transportation home?: Yes,  [name] [phone] (Mom)  Do you have the ability to pay for your medications: Yes,  Insured  Release of information consent forms completed and in the chart;  [name] signature needed at discharge.  Patient to Follow up at:  Follow-up Information     Mapp, Gaylyn Cheers, MD. Call.   Contact information: [address] [place] [address] [phone]         [facility] at [place]. Go on 12/06/2022.   Why: With [facility] @ 2pm Contact information: [address] [place], [place], [place] [address] Hours:  Open  Closes 5:30?PM Phone: [phone]         Outpatient Behavioral Health at [facility] Follow up on 12/01/2022.   Why: Please complete screening prior to med management appointment on May 15th at 900 am Contact information: [address] [place], [place], [place] [address] Hours:  Open  Closes 5:30?PM Phone: [phone]        [facility] Follow up on 12/01/2022.   Why: You are scheduled for a virtual appointment with [facility] on May 15th at 2:00pm. Contact information: [address]  [address] [place] [address] [phone]                 Next level of care provider has access to [facility] Link:yes  Safety Planning and Suicide Prevention discussed: Yes,  [name] [phone] [facility])      Has patient been referred to the Quitline?: Patient refused referral for treatment  Patient has been referred for addiction treatment: Yes, the patient will follow up with an outpatient provider for substance use disorder. Psychiatrist/APP: appointment made, Therapist: appointment made, and PCP: patient to schedule appointment Patient to continue working towards treatment goals after discharge. Patient no longer  meets criteria for inpatient criteria per attending physician. Continue taking medications as prescribed, nursing to provide instructions at discharge. Follow up with all scheduled appointments.   Brylan Dec S Masen Salvas, LCSW 11/24/2022, 10:17 AM

## 2022-11-24 NOTE — BHH Suicide Risk Assessment (Signed)
[facility] Discharge Suicide Risk Assessment   Principal Problem: Bipolar I disorder, current or most recent episode depressed, with psychotic features [facility]) Discharge Diagnoses: Active Problems:   Substance induced mood disorder (HCC)   Alcohol use disorder, severe, dependence (HCC)   PTSD (post-traumatic stress disorder)   Cocaine dependence syndrome (HCC)   GAD (generalized anxiety disorder)   Total Time spent with patient: 30 minutes  Musculoskeletal: Strength & Muscle Tone: within normal limits Gait & Station: normal Patient leans: N/A  Psychiatric Specialty Exam  Presentation  General Appearance:  Disheveled  Eye Contact: Poor  Speech: Slow  Speech Volume: Decreased  Handedness: Right   Mood and Affect  Mood: Anxious; Depressed  Duration of Depression Symptoms: Greater than two weeks  Affect: Depressed   Thought Process  Thought Processes: Linear  Descriptions of Associations:Intact  Orientation:Partial  Thought Content:Logical  History of Schizophrenia/Schizoaffective disorder:No  Duration of Psychotic Symptoms:N/A  Hallucinations:No data recorded Ideas of Reference:None  Suicidal Thoughts:No data recorded Homicidal Thoughts:No data recorded  Sensorium  Memory: Immediate Good; Remote Fair  Judgment: Impaired  Insight: Lacking   Executive Functions  Concentration: Poor  Attention Span: Poor  Recall: [employer] of Knowledge: Fair  Language: Fair   Psychomotor Activity  Psychomotor Activity:No data recorded  Assets  Assets: Communication Skills; Desire for Improvement; Social Support   Sleep  Sleep:No data recorded  Physical Exam: Physical Exam ROS Blood pressure (!) 110/93, pulse (!) 106, temperature 99.7 F (37.6 C), temperature source Oral, resp. rate 16, height 5\' 6"  (1.676 m), weight 72.3 kg, SpO2 99 %. Body mass index is 25.73 kg/m.  Mental Status Per Nursing Assessment::   On Admission:  Suicidal  ideation indicated by patient  Demographic Factors:  Male  Loss Factors: Financial problems/change in socioeconomic status  Historical Factors: Impulsivity  Risk Reduction Factors:   Religious beliefs about death and Positive social support  Continued Clinical Symptoms:  Depression:   Impulsivity  Cognitive Features That Contribute To Risk:  Closed-mindedness    Suicide Risk:  Minimal: No identifiable suicidal ideation.  Patients presenting with no risk factors but with morbid ruminations; may be classified as minimal risk based on the severity of the depressive symptoms   Follow-up Information     Mapp, Gaylyn Cheers, MD. Call.   Contact information: [address] [place] [address] [phone]         [facility] Outpatient Behavioral Health at [place]. Go on 12/06/2022.   Why: With [facility] @ 2pm Contact information: [address] [place], [place], [place] [address] Hours:  Open  Closes 5:30?PM Phone: [phone]        [facility] at [facility] Follow up on 12/01/2022.   Why: Please complete screening prior to med management appointment on May 15th at 900 am Contact information: [address] [place], [place], [place] [address] Hours:  Open  Closes 5:30?PM Phone: [phone]        [facility] Follow up on 12/01/2022.   Why: You are scheduled for a virtual appointment with [facility] on May 15th at 2:00pm. Contact information: [address]  [address] [place] [address] [phone]                 Plan Of Care/Follow-up recommendations:  Activity:  normal Diet:  Regular  Timmie Foerster, MD 11/24/2022, 10:04 AM

## 2022-11-24 NOTE — Progress Notes (Signed)
  During this shift, patient presented with complaint of anxiety.  Administered PRN Hydroxyzine per [facility] per patient request.

## 2022-11-24 NOTE — Group Note (Signed)
Recreation Therapy Group Note   Group Topic:Team Building  Group Date: 11/24/2022 Start Time: 0934 End Time: 1010 Facilitators: Alessia Gonsalez-McCall, LRT,CTRS Location: 300 Hall Dayroom   Goal Area(s) Addresses:  Patient will effectively work with peer towards shared goal.  Patient will identify skills used to make activity successful.  Patient will identify how skills used during activity can be used to reach post d/c goals.   Group Description: Landing Pad. In teams of 3-5, patients were given 12 plastic drinking straws and an equal length of masking tape. Using the materials provided, patients were asked to build a landing pad to catch a golf ball dropped from approximately 5 feet in the air. All materials were required to be used by the team in their design. LRT facilitated post-activity discussion.   Affect/Mood: Appropriate   Participation Level: Active   Participation Quality: Independent   Behavior: Appropriate   Speech/Thought Process: Focused   Insight: Good   Judgement: Good   Modes of Intervention: STEM Activity   Patient Response to Interventions:  Engaged   Education Outcome:  Acknowledges education   Clinical Observations/Individualized Feedback: Pt attended and participated in group session.    Plan: Continue to engage patient in RT group sessions 2-3x/week.   Jessenia Filippone-McCall, LRT,CTRS 11/24/2022 12:40 PM

## 2022-11-24 NOTE — BHH Group Notes (Signed)
BHH Group Notes:  (Nursing/MHT/Case Management/Adjunct)  Date:  11/24/2022  Time:  1:30 AM  Type of Therapy:   Wrap-up group  Participation Level:  Active  Participation Quality:  Appropriate  Affect:  Appropriate  Cognitive:  Appropriate  Insight:  Appropriate  Engagement in Group:  Engaged  Modes of Intervention:  Education  Summary of Progress/Problems: Goal to get out of bed, attend groups. Pt reports attending groups today. Day was 7/10.  Noah Delaine 11/24/2022, 1:30 AM

## 2022-11-24 NOTE — Progress Notes (Signed)
   11/24/22 0600  15 Minute Checks  Location Bedroom  Visual Appearance Calm  Behavior Sleeping  Sleep (Behavioral Health Patients Only)  Calculate sleep? (Click Yes once per 24 hr at 0600 safety check) Yes  Documented sleep last 24 hours 7.75

## 2022-11-24 NOTE — BH IP Treatment Plan (Signed)
Interdisciplinary Treatment and Diagnostic Plan Update  11/24/2022 Time of Session: 8:55 AM ( UPDATE)  [name] MRN: [id number]  Principal Diagnosis: Bipolar I disorder, current or most recent episode depressed, with psychotic features (HCC)  Secondary Diagnoses: Active Problems:   Alcohol use disorder, severe, dependence (HCC)   PTSD (post-traumatic stress disorder)   Cocaine dependence syndrome (HCC)   Substance induced mood disorder (HCC)   GAD (generalized anxiety disorder)   Current Medications:  Current Facility-Administered Medications  Medication Dose Route Frequency Provider Last Rate Last Admin   acetaminophen (TYLENOL) tablet 650 mg  650 mg Oral Q6H PRN Park Pope, MD   650 mg at 11/23/22 1303   alum & mag hydroxide-simeth (MAALOX/MYLANTA) 200-200-20 MG/5ML suspension 30 mL  30 mL Oral Q4H PRN Onuoha, Chinwendu V, NP   30 mL at 11/22/22 2113   amLODipine (NORVASC) tablet 5 mg  5 mg Oral Daily Massengill, Harrold Donath, MD   5 mg at 11/24/22 7829   ARIPiprazole (ABILIFY) tablet 10 mg  10 mg Oral Daily Massengill, Harrold Donath, MD   10 mg at 11/24/22 0737   atorvastatin (LIPITOR) tablet 40 mg  40 mg Oral Daily Massengill, Harrold Donath, MD   40 mg at 11/24/22 0737   busPIRone (BUSPAR) tablet 7.5 mg  7.5 mg Oral TID Phineas Inches, MD   7.5 mg at 11/24/22 5621   cloNIDine (CATAPRES) tablet 0.1 mg  0.1 mg Oral Q4H PRN Phineas Inches, MD   0.1 mg at 11/18/22 1227   divalproex (DEPAKOTE) DR tablet 500 mg  500 mg Oral Q12H Massengill, Harrold Donath, MD   500 mg at 11/24/22 3086   folic acid (FOLVITE) tablet 1 mg  1 mg Oral Daily Massengill, Harrold Donath, MD   1 mg at 11/24/22 0737   gabapentin (NEURONTIN) capsule 300 mg  300 mg Oral Q8H Massengill, Harrold Donath, MD   300 mg at 11/24/22 5784   hydrOXYzine (ATARAX) tablet 25 mg  25 mg Oral Q6H PRN Phineas Inches, MD   25 mg at 11/23/22 2103   LORazepam (ATIVAN) tablet 2 mg  2 mg Oral TID PRN Onuoha, Chinwendu V, NP       Or   LORazepam (ATIVAN) injection 2  mg  2 mg Intramuscular TID PRN Onuoha, Chinwendu V, NP       LORazepam (ATIVAN) injection 2 mg  2 mg Intramuscular TID PRN Massengill, Harrold Donath, MD       magnesium hydroxide (MILK OF MAGNESIA) suspension 30 mL  30 mL Oral Daily PRN Onuoha, Chinwendu V, NP   30 mL at 11/23/22 0834   multivitamin with minerals tablet 1 tablet  1 tablet Oral Daily Onuoha, Chinwendu V, NP   1 tablet at 11/24/22 0737   ondansetron (ZOFRAN) tablet 4 mg  4 mg Oral Q8H PRN Park Pope, MD   4 mg at 11/20/22 1529   thiamine (Vitamin B-1) tablet 100 mg  100 mg Oral Daily Massengill, Harrold Donath, MD   100 mg at 11/24/22 0738   traZODone (DESYREL) tablet 100 mg  100 mg Oral Seabron Spates, MD   100 mg at 11/23/22 2103   PTA Medications: Medications Prior to Admission  Medication Sig Dispense Refill Last Dose   amLODipine (NORVASC) 5 MG tablet Take 1 tablet (5 mg total) by mouth daily. (Patient not taking: Reported on 11/17/2022) 30 tablet 0    ARIPiprazole (ABILIFY) 10 MG tablet Take 1 tablet (10 mg total) by mouth at bedtime. (Patient not taking: Reported on 11/17/2022) 30 tablet 0  atorvastatin (LIPITOR) 40 MG tablet Take 40 mg by mouth daily. (Patient not taking: Reported on 11/17/2022)      busPIRone (BUSPAR) 10 MG tablet Take 1 tablet (10 mg total) by mouth 3 (three) times daily. (Patient not taking: Reported on 11/17/2022) 90 tablet 0    chlordiazePOXIDE (LIBRIUM) 25 MG capsule 50mg  PO TID x 1D, then 25-50mg  PO BID X 1D, then 25-50mg  PO QD X 1D (Patient not taking: Reported on 11/17/2022) 12 capsule 0    folic acid (FOLVITE) 1 MG tablet Take 1 tablet (1 mg total) by mouth daily. (Patient not taking: Reported on 11/17/2022)      gabapentin (NEURONTIN) 300 MG capsule Take 1 capsule (300 mg total) by mouth 3 (three) times daily. (Patient not taking: Reported on 11/17/2022) 90 capsule 0    hydrOXYzine (ATARAX) 50 MG tablet Take 50 mg by mouth 3 (three) times daily. (Patient not taking: Reported on 11/17/2022)      lamoTRIgine (LAMICTAL) 25 MG  tablet Take 1 tablet (25 mg total) by mouth daily. (Patient not taking: Reported on 11/17/2022) 30 tablet 0    LORazepam (ATIVAN) 1 MG tablet Take 1 mg by mouth every 8 (eight) hours as needed for anxiety. (Patient not taking: Reported on 11/17/2022)      magnesium oxide (MAG-OX) 400 (240 Mg) MG tablet Take 1 tablet (400 mg total) by mouth 2 (two) times daily. (Patient not taking: Reported on 11/17/2022)      meclizine (ANTIVERT) 25 MG tablet Take 1 tablet (25 mg total) by mouth 3 (three) times daily as needed for dizziness. (Patient not taking: Reported on 11/17/2022) 30 tablet 0    nicotine (NICODERM CQ - DOSED IN MG/24 HR) 7 mg/24hr patch Place 1 patch (7 mg total) onto the skin daily as needed (tobacco withdrawal). (Patient not taking: Reported on 11/17/2022) 28 patch 0    thiamine (VITAMIN B-1) 100 MG tablet Take 1 tablet (100 mg total) by mouth daily. (Patient not taking: Reported on 11/17/2022)      TYLENOL 500 MG tablet Take 500-1,000 mg by mouth every 6 (six) hours as needed (for headaches).       Patient Stressors:    Patient Strengths:    Treatment Modalities: Medication Management, Group therapy, Case management,  1 to 1 session with clinician, Psychoeducation, Recreational therapy.   Physician Treatment Plan for Primary Diagnosis: Bipolar I disorder, current or most recent episode depressed, with psychotic features (HCC) Long Term Goal(s): Improvement in symptoms so as ready for discharge   Short Term Goals: Ability to identify changes in lifestyle to reduce recurrence of condition will improve Ability to verbalize feelings will improve Ability to disclose and discuss suicidal ideas Ability to demonstrate self-control will improve Ability to identify and develop effective coping behaviors will improve Ability to maintain clinical measurements within normal limits will improve Compliance with prescribed medications will improve Ability to identify triggers associated with substance  abuse/mental health issues will improve  Medication Management: Evaluate patient's response, side effects, and tolerance of medication regimen.  Therapeutic Interventions: 1 to 1 sessions, Unit Group sessions and Medication administration.  Evaluation of Outcomes: Adequate for Discharge  Physician Treatment Plan for Secondary Diagnosis: Active Problems:   Alcohol use disorder, severe, dependence (HCC)   PTSD (post-traumatic stress disorder)   Cocaine dependence syndrome (HCC)   Substance induced mood disorder (HCC)   GAD (generalized anxiety disorder)  Long Term Goal(s): Improvement in symptoms so as ready for discharge   Short Term Goals: Ability  to identify changes in lifestyle to reduce recurrence of condition will improve Ability to verbalize feelings will improve Ability to disclose and discuss suicidal ideas Ability to demonstrate self-control will improve Ability to identify and develop effective coping behaviors will improve Ability to maintain clinical measurements within normal limits will improve Compliance with prescribed medications will improve Ability to identify triggers associated with substance abuse/mental health issues will improve     Medication Management: Evaluate patient's response, side effects, and tolerance of medication regimen.  Therapeutic Interventions: 1 to 1 sessions, Unit Group sessions and Medication administration.  Evaluation of Outcomes: Adequate for Discharge   RN Treatment Plan for Primary Diagnosis: Bipolar I disorder, current or most recent episode depressed, with psychotic features (HCC) Long Term Goal(s): Knowledge of disease and therapeutic regimen to maintain health will improve  Short Term Goals: Ability to remain free from injury will improve, Ability to verbalize frustration and anger appropriately will improve, Ability to participate in decision making will improve, Ability to verbalize feelings will improve, Ability to identify  and develop effective coping behaviors will improve, and Compliance with prescribed medications will improve  Medication Management: RN will administer medications as ordered by provider, will assess and evaluate patient's response and provide education to patient for prescribed medication. RN will report any adverse and/or side effects to prescribing provider.  Therapeutic Interventions: 1 on 1 counseling sessions, Psychoeducation, Medication administration, Evaluate responses to treatment, Monitor vital signs and CBGs as ordered, Perform/monitor CIWA, COWS, AIMS and Fall Risk screenings as ordered, Perform wound care treatments as ordered.  Evaluation of Outcomes: Adequate for Discharge   LCSW Treatment Plan for Primary Diagnosis: Bipolar I disorder, current or most recent episode depressed, with psychotic features (HCC) Long Term Goal(s): Safe transition to appropriate next level of care at discharge, Engage patient in therapeutic group addressing interpersonal concerns.  Short Term Goals: Engage patient in aftercare planning with referrals and resources, Increase social support, Increase emotional regulation, Facilitate acceptance of mental health diagnosis and concerns, Identify triggers associated with mental health/substance abuse issues, and Increase skills for wellness and recovery  Therapeutic Interventions: Assess for all discharge needs, 1 to 1 time with Social worker, Explore available resources and support systems, Assess for adequacy in community support network, Educate family and significant other(s) on suicide prevention, Complete Psychosocial Assessment, Interpersonal group therapy.  Evaluation of Outcomes: Adequate for Discharge  Progress in Treatment: Attending groups: Yes. Participating in groups: Yes. Taking medication as prescribed: Yes. Toleration medication: Yes. Family/Significant other contact made: Yes; [name] [phone]  Patient understands diagnosis:  Yes. Discussing patient identified problems/goals with staff: Yes. Medical problems stabilized or resolved: Yes. Denies suicidal/homicidal ideation: Yes. Issues/concerns per patient self-inventory: No.    New problem(s) identified: No, Describe:  None Reported   New Short Term/Long Term Goal(s):medication stabilization, elimination of SI thoughts, development of comprehensive mental wellness plan.     Patient Goals:  Medication Stabilization   Discharge Plan or Barriers: : Patient recently admitted. CSW will continue to follow and assess for appropriate referrals and possible discharge planning.       Reason for Continuation of Hospitalization: Anxiety Depression Hallucinations Medication stabilization Suicidal ideation Withdrawal symptoms   Estimated Length of Stay: Adequate for Discharge    Last 3 [place] Suicide Severity Risk Score: Flowsheet Row Admission (Current) [facility] from 11/17/2022 in [facility] at [facility] ED from 07/10/2022 in [facility] Emergency Department at [facility]  C-SSRS RISK CATEGORY Moderate Risk High Risk No Risk       Last PHQ 2/9 Scores:    06/25/2022   11:51 AM 02/11/2022    3:38 PM 08/05/2021   11:22 AM  Depression screen PHQ 2/9  Decreased Interest 0 0 2  Down, Depressed, Hopeless 0 0 3  PHQ - 2 Score 0 0 5  Altered sleeping 0  3  Tired, decreased energy 0  2  Change in appetite 0  1  Feeling bad or failure about yourself  0  1  Trouble concentrating 0  1  Moving slowly or fidgety/restless 0  1  Suicidal thoughts 0  0  PHQ-9 Score 0  14  Difficult doing work/chores Not difficult at all  Extremely dIfficult    Scribe for Treatment Team: Beather Arbour 11/24/2022 9:17 AM
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
## 2022-11-29 ENCOUNTER — Ambulatory Visit (INDEPENDENT_AMBULATORY_CARE_PROVIDER_SITE_OTHER): Payer: Medicaid Other

## 2022-11-29 ENCOUNTER — Other Ambulatory Visit: Payer: Self-pay

## 2022-11-29 VITALS — BP 149/100 | HR 88 | Temp 98.4°F | Ht 66.0 in | Wt 159.4 lb

## 2022-11-29 DIAGNOSIS — F411 Generalized anxiety disorder: Secondary | ICD-10-CM

## 2022-11-29 DIAGNOSIS — F3164 Bipolar disorder, current episode mixed, severe, with psychotic features: Secondary | ICD-10-CM

## 2022-11-29 DIAGNOSIS — F5101 Primary insomnia: Secondary | ICD-10-CM | POA: Diagnosis present

## 2022-11-29 DIAGNOSIS — F315 Bipolar disorder, current episode depressed, severe, with psychotic features: Secondary | ICD-10-CM

## 2022-11-29 DIAGNOSIS — I10 Essential (primary) hypertension: Secondary | ICD-10-CM | POA: Diagnosis not present

## 2022-11-29 MED ORDER — TRAZODONE HCL 50 MG PO TABS: 50.0000 mg | ORAL_TABLET | Freq: Every day | ORAL | 0 refills | Status: DC

## 2022-11-29 MED ORDER — ATORVASTATIN CALCIUM 40 MG PO TABS: 40.0000 mg | ORAL_TABLET | Freq: Every day | ORAL | 1 refills | Status: DC

## 2022-11-29 MED ORDER — AMLODIPINE BESYLATE 5 MG PO TABS
5.0000 mg | ORAL_TABLET | Freq: Every day | ORAL | 1 refills | Status: DC
Start: 2022-11-29 — End: 2023-01-07

## 2022-11-29 MED ORDER — BUSPIRONE HCL 10 MG PO TABS
10.0000 mg | ORAL_TABLET | Freq: Three times a day (TID) | ORAL | 0 refills | Status: DC
Start: 2022-11-29 — End: 2023-01-07

## 2022-11-29 MED ORDER — GABAPENTIN 300 MG PO CAPS: 300.0000 mg | ORAL_CAPSULE | Freq: Three times a day (TID) | ORAL | 1 refills | Status: DC

## 2022-11-29 NOTE — Assessment & Plan Note (Signed)
Current medications include amLODipine 5 MG daily. Patient states that they are compliant with this medication but ran out 3 days ago. Patient denies HA, lightheadedness, dizziness, CP, or SOB. Initial BP today is 145/92. Repeat BP is 149/100.    Plan: - Continue amLODipine 5 MG daily

## 2022-11-29 NOTE — Patient Instructions (Addendum)
Thank you for coming to see [place] in clinic [place].  Plan:  - Medications refilled today:  - Amlodipine 5 mg daily - Atorvastatin 40 mg daily  - Buspar 10 mg three times daily  - Gabapentin 300 mg three times daily  - Please follow up with behavioral health (Dr. Lolly Mustache) on 12/01/2022 at 9:00 AM regarding recent medication changes and difficulty sleeping.    - Address: [address] [place], [address], [place], [place] [address] - Phone: [phone]  It was very nice to see you, thank you for allowing [place] to be involved in your care. We look forward to seeing you next time. Please call our clinic at [phone] if you have any questions or concerns. The best time to call is Monday-Friday from 9am-4pm, but there is someone available 24/7. If after hours or the weekend, call the main hospital number at [phone] and ask for the Internal Medicine Resident On-Call. If you need medication refills, please notify your pharmacy one week in advance and they will send [place] a request.   Please make sure to arrive 15 minutes prior to your next appointment. If you arrive late, you may be asked to reschedule.

## 2022-11-29 NOTE — Assessment & Plan Note (Signed)
Patient has a history of substance-induced mood disorder, GAD, depression, bipolar disorder, and prior suicide attempt. Recently discharged from inpatient behavioral health on 5/8 after being hospitalized for substance-induced mood disorder. Aripiprazole, lamotrigine, and lorazepam were discontinued. Current medications include busPIRone 10 MG TID and divalproex 500 MG BID. Patient reports that they are compliant with these medications. Patient has an appointment scheduled on 5/15 w/ Dr. Lolly Mustache w/ behavioral health.   Plan: - Continue busPIRone 10 MG TID and divalproex 500 MG BID - Continue f/u w/ behavioral health

## 2022-11-29 NOTE — Addendum Note (Signed)
Addended by: Karoline Caldwell on: 11/29/2022 03:39 PM   Modules accepted: Orders
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
## 2022-11-29 NOTE — Assessment & Plan Note (Addendum)
Patient reports difficulty sleeping over the last few weeks. Patient reports good sleep hygiene including turning off lights/distractions, urinating before bed, and similar daily routine. Patient has tried melatonin and seroquel previously without relief. Patient reports that trazodone helps with sleep and has been taking this medication for several years without difficulty. Will message Dr. Lolly Mustache (new psychiatrist) regarding restarting trazodone or if they would prefer to manage this medication vs prescribe an alternative medication.  Plan: - Continue f/u w/ behavioral health  Addendum 5/13 Dr. Lolly Mustache comfortable with starting low dose trazodone and will reassess patients regimen at appointment this week.   Plan: - Start trazodone 50 mg daily

## 2022-12-01 ENCOUNTER — Ambulatory Visit (HOSPITAL_COMMUNITY): Payer: Medicaid Other | Admitting: Psychiatry

## 2022-12-02 NOTE — Progress Notes (Signed)
Internal Medicine Clinic Attending  Case discussed with Dr. Mapp  At the time of the visit.  We reviewed the resident's history and exam and pertinent patient test results.  I agree with the assessment, diagnosis, and plan of care documented in the resident's note.  

## 2022-12-06 ENCOUNTER — Ambulatory Visit (INDEPENDENT_AMBULATORY_CARE_PROVIDER_SITE_OTHER): Payer: Medicaid Other | Admitting: Licensed Clinical Social Worker

## 2022-12-06 ENCOUNTER — Encounter (HOSPITAL_COMMUNITY): Payer: Self-pay | Admitting: Licensed Clinical Social Worker

## 2022-12-06 DIAGNOSIS — F1020 Alcohol dependence, uncomplicated: Secondary | ICD-10-CM | POA: Diagnosis not present

## 2022-12-06 DIAGNOSIS — F319 Bipolar disorder, unspecified: Secondary | ICD-10-CM

## 2022-12-06 DIAGNOSIS — F411 Generalized anxiety disorder: Secondary | ICD-10-CM | POA: Diagnosis not present

## 2022-12-06 DIAGNOSIS — F4310 Post-traumatic stress disorder, unspecified: Secondary | ICD-10-CM
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
## 2022-12-11 ENCOUNTER — Emergency Department (HOSPITAL_COMMUNITY)
Admission: EM | Admit: 2022-12-11 | Discharge: 2022-12-11 | Disposition: A | Discharge status: Home / Self Care | Payer: Medicaid Other | Attending: Emergency Medicine | Admitting: Emergency Medicine

## 2022-12-11 ENCOUNTER — Other Ambulatory Visit: Payer: Self-pay

## 2022-12-11 ENCOUNTER — Encounter (HOSPITAL_COMMUNITY): Payer: Self-pay | Admitting: *Deleted

## 2022-12-11 DIAGNOSIS — K600 Acute anal fissure: Secondary | ICD-10-CM | POA: Diagnosis not present

## 2022-12-11 DIAGNOSIS — K610 Anal abscess: Secondary | ICD-10-CM | POA: Diagnosis present

## 2022-12-11 MED ORDER — LIDOCAINE 5 % EX OINT: 1.0000 | TOPICAL_OINTMENT | CUTANEOUS | 0 refills | Status: AC | PRN

## 2022-12-11 MED ORDER — HYDROCORTISONE 1 % EX CREA: TOPICAL_CREAM | CUTANEOUS | 0 refills | Status: DC

## 2022-12-11 NOTE — ED Triage Notes (Signed)
Reports abscess near rectum and itching.

## 2022-12-11 NOTE — Discharge Instructions (Addendum)
You were seen in the emergency department for concerns of an abscess. On my examination, it appears that you actually have a small anal fissure which is causing your symptoms. I have sent in prescriptions for lidocaine and hydrocortisone that you may apply topically to the area for the next few days as healing occurs. Hygiene is crucial during this time and I would advise that you clean your rectum well with warm soap and water after any bowel movements. I also would advise increasing your fiber intake with fruits and vegetables to ensure your bowel movements are more consistent and easier to pass. If you feel that the pain is worsening or you begin to experience pronounced drainage, please return to the ER for further evaluation.

## 2022-12-11 NOTE — ED Provider Notes (Signed)
[place] EMERGENCY DEPARTMENT AT [facility] Provider Note   CSN: [id number] Arrival date & time: 12/11/22  1218     History Chief Complaint  Patient presents with   Abscess    [name] is a 42 y.o. male.  Patient presents emergency department with complaints of an abscess. She states that she was recently changed from anti-epileptic medications and believes this has caused some constipation. She does endorse some mild bright red blood per rectum with bowel movements, but no active bleeding. Reports perianal pain and itching. Denies fevers, vomiting, abdominal pain.   Abscess      Home Medications Prior to Admission medications   Medication Sig Start Date End Date Taking? Authorizing Provider  hydrocortisone cream 1 % Apply to affected area 2 times daily 12/11/22  Yes Levy Cedano A, PA-C  lidocaine (XYLOCAINE) 5 % ointment Apply 1 Application topically as needed for up to 10 days. 12/11/22 12/21/22 Yes Smitty Knudsen, PA-C  amLODipine (NORVASC) 5 MG tablet Take 1 tablet (5 mg total) by mouth daily. 11/29/22   Mapp, Gaylyn Cheers, MD  atorvastatin (LIPITOR) 40 MG tablet Take 1 tablet (40 mg total) by mouth daily. 11/29/22   Mapp, Gaylyn Cheers, MD  busPIRone (BUSPAR) 10 MG tablet Take 1 tablet (10 mg total) by mouth 3 (three) times daily. 11/29/22   Mapp, Gaylyn Cheers, MD  divalproex (DEPAKOTE) 500 MG DR tablet Take 1 tablet (500 mg total) by mouth every 12 (twelve) hours. 11/24/22 12/24/22  Timmie Foerster, MD  folic acid (FOLVITE) 1 MG tablet Take 1 tablet (1 mg total) by mouth daily. Patient not taking: Reported on 11/17/2022 06/06/22   Burnadette Pop, MD  folic acid (FOLVITE) 1 MG tablet Take 1 tablet (1 mg total) by mouth daily. 11/24/22 12/24/22  Timmie Foerster, MD  gabapentin (NEURONTIN) 300 MG capsule Take 1 capsule (300 mg total) by mouth every 8 (eight) hours. 11/29/22 01/28/23  Mapp, Gaylyn Cheers, MD  thiamine (VITAMIN B-1) 100 MG tablet Take 1 tablet (100 mg total) by mouth daily. Patient  not taking: Reported on 11/17/2022 06/06/22   Burnadette Pop, MD  traZODone (DESYREL) 50 MG tablet Take 1 tablet (50 mg total) by mouth at bedtime. 11/29/22   Mapp, Gaylyn Cheers, MD      Allergies    Codeine, Penicillins, Morphine, Coconut (cocos nucifera), Grapefruit concentrate, Morphine and codeine, Oxycodone, and Norco [hydrocodone-acetaminophen]    Review of Systems   Review of Systems  Gastrointestinal:  Positive for rectal pain.  All other systems reviewed and are negative.   Physical Exam Updated Vital Signs BP (!) 140/75   Pulse 64   Temp 98.3 F (36.8 C) (Oral)   Resp 18   Ht 5\' 6"  (1.676 m)   Wt 72 kg   SpO2 94%   BMI 25.63 kg/m  Physical Exam Vitals and nursing note reviewed.  Constitutional:      General: She is not in acute distress.    Appearance: She is well-developed.  HENT:     Head: Normocephalic and atraumatic.  Eyes:     Conjunctiva/sclera: Conjunctivae normal.  Cardiovascular:     Rate and Rhythm: Normal rate and regular rhythm.     Heart sounds: No murmur heard. Pulmonary:     Effort: Pulmonary effort is normal. No respiratory distress.     Breath sounds: Normal breath sounds.  Abdominal:     Palpations: Abdomen is soft.     Tenderness: There is no abdominal tenderness.  Genitourinary:  Rectum: Anal fissure present.       Comments: Small linear tear on the posterior aspect of the rectum. No obvious drainage or abscess formation. Musculoskeletal:        General: No swelling.     Cervical back: Neck supple.  Skin:    General: Skin is warm and dry.     Capillary Refill: Capillary refill takes less than 2 seconds.  Neurological:     Mental Status: She is alert.  Psychiatric:        Mood and Affect: Mood normal.     ED Results / Procedures / Treatments   Labs (all labs ordered are listed, but only abnormal results are displayed) Labs Reviewed - No data to display  EKG None  Radiology No results found.  Procedures Procedures    Medications Ordered in ED Medications - No data to display  ED Course/ Medical Decision Making/ A&P                           Medical Decision Making Risk Prescription drug management.   This patient presents to the ED for concern of abscess. Differential diagnosis includes perianal abscess, fistula, external hemorrhoid   Problem List / ED Course:  Patient presents to the ED for concerns of an abscess. He reports that he recently had a change made to anti-epileptic medication which has worsened his constipation. Patient states increased straining with bowel movements which typically causes rectal pain. Did endorse some mild rectal bleeding with some spotting noted in the toilet bowl. Denies fevers, abdominal pain, nausea, or vomiting. Physical exam showed signs of what appears to be an anal fissure that has formed, likely secondary to increased constipation and straining with bowel movements. Informed patient on how to care for this and prescribed lidocaine and hydrocortisone for added comfort. Also provided patient with sitz bath to use at home to encourage healing of the area. Discussed hygiene of the area to reduce the risk of an infection spreading through the exposed skin. I believe that patient is currently stable for discharge home. Patient is agreeable with treatment plan and verbalized understanding all return precautions.  Final Clinical Impression(s) / ED Diagnoses Final diagnoses:  Acute anal fissure    Rx / DC Orders ED Discharge Orders          Ordered    lidocaine (XYLOCAINE) 5 % ointment  As needed        12/11/22 1250    hydrocortisone cream 1 %        12/11/22 1250              Smitty Knudsen, PA-C 12/11/22 2103    HortonClabe Seal, DO 12/12/22 0719

## 2022-12-22 ENCOUNTER — Encounter (HOSPITAL_COMMUNITY): Payer: Medicaid Other | Admitting: Psychiatry

## 2022-12-22 ENCOUNTER — Encounter (HOSPITAL_COMMUNITY): Payer: Self-pay

## 2022-12-22 NOTE — Progress Notes (Signed)
Patient phone camera not working and he cannot do virtual.

## 2022-12-28 ENCOUNTER — Telehealth (HOSPITAL_COMMUNITY): Payer: Self-pay | Admitting: Licensed Clinical Social Worker

## 2022-12-28 ENCOUNTER — Ambulatory Visit (HOSPITAL_COMMUNITY): Payer: Medicaid Other | Admitting: Licensed Clinical Social Worker

## 2022-12-28 NOTE — Telephone Encounter (Signed)
[name], who prefers to go by "[name]" was scheduled to attend an in-person therapy session today.  Clinician outreached [name] at 2:08pm by phone when she had not presented for appointment on time.  [name] did not answer, and a voicemail could not be left at this time.  Clinician informed front desk staff of no-show event when [name] did not present or outreach office regarding tardiness by 2:15pm.    Noralee Stain, LCSW, LCAS 12/28/22

## 2022-12-31 ENCOUNTER — Emergency Department (HOSPITAL_COMMUNITY)
Admission: EM | Admit: 2022-12-31 | Discharge: 2022-12-31 | Discharge status: Against Medical Advice | Payer: Medicaid Other | Attending: Student | Admitting: Student

## 2022-12-31 ENCOUNTER — Other Ambulatory Visit: Payer: Self-pay

## 2022-12-31 ENCOUNTER — Emergency Department (HOSPITAL_COMMUNITY)
Admission: EM | Admit: 2022-12-31 | Discharge: 2023-01-01 | Disposition: A | Discharge status: Other Acute Inpatient Hospital | Payer: Medicaid Other | Source: Home / Self Care | Attending: Emergency Medicine | Admitting: Emergency Medicine

## 2022-12-31 ENCOUNTER — Encounter (HOSPITAL_COMMUNITY): Payer: Self-pay

## 2022-12-31 DIAGNOSIS — F411 Generalized anxiety disorder: Secondary | ICD-10-CM | POA: Insufficient documentation

## 2022-12-31 DIAGNOSIS — F315 Bipolar disorder, current episode depressed, severe, with psychotic features: Secondary | ICD-10-CM | POA: Insufficient documentation

## 2022-12-31 DIAGNOSIS — F1914 Other psychoactive substance abuse with psychoactive substance-induced mood disorder: Secondary | ICD-10-CM | POA: Insufficient documentation

## 2022-12-31 DIAGNOSIS — F32A Depression, unspecified: Secondary | ICD-10-CM | POA: Diagnosis present

## 2022-12-31 DIAGNOSIS — Z5321 Procedure and treatment not carried out due to patient leaving prior to being seen by health care provider: Secondary | ICD-10-CM | POA: Insufficient documentation

## 2022-12-31 DIAGNOSIS — R45851 Suicidal ideations: Secondary | ICD-10-CM | POA: Insufficient documentation

## 2022-12-31 DIAGNOSIS — I10 Essential (primary) hypertension: Secondary | ICD-10-CM | POA: Insufficient documentation

## 2022-12-31 DIAGNOSIS — F1994 Other psychoactive substance use, unspecified with psychoactive substance-induced mood disorder: Secondary | ICD-10-CM | POA: Diagnosis present

## 2022-12-31 DIAGNOSIS — Z79899 Other long term (current) drug therapy: Secondary | ICD-10-CM | POA: Insufficient documentation

## 2022-12-31 DIAGNOSIS — F1020 Alcohol dependence, uncomplicated: Secondary | ICD-10-CM | POA: Insufficient documentation

## 2022-12-31 DIAGNOSIS — Y902 Blood alcohol level of 40-59 mg/100 ml: Secondary | ICD-10-CM | POA: Insufficient documentation

## 2022-12-31 DIAGNOSIS — F332 Major depressive disorder, recurrent severe without psychotic features: Secondary | ICD-10-CM | POA: Diagnosis present

## 2022-12-31 LAB — CBC WITH DIFFERENTIAL/PLATELET
Abs Immature Granulocytes: 0.02 10*3/uL (ref 0.00–0.07)
Basophils Absolute: 0 10*3/uL (ref 0.0–0.1)
Basophils Relative: 1 %
Eosinophils Absolute: 0 10*3/uL (ref 0.0–0.5)
Eosinophils Relative: 1 %
HCT: 45.3 % (ref 39.0–52.0)
Hemoglobin: 15.3 g/dL (ref 13.0–17.0)
Immature Granulocytes: 0 %
Lymphocytes Relative: 26 %
Lymphs Abs: 1.6 10*3/uL (ref 0.7–4.0)
MCH: 30.2 pg (ref 26.0–34.0)
MCHC: 33.8 g/dL (ref 30.0–36.0)
MCV: 89.5 fL (ref 80.0–100.0)
Monocytes Absolute: 0.4 10*3/uL (ref 0.1–1.0)
Monocytes Relative: 7 %
Neutro Abs: 4 10*3/uL (ref 1.7–7.7)
Neutrophils Relative %: 65 %
Platelets: 218 10*3/uL (ref 150–400)
RBC: 5.06 MIL/uL (ref 4.22–5.81)
RDW: 14.5 % (ref 11.5–15.5)
WBC: 6.1 10*3/uL (ref 4.0–10.5)
nRBC: 0 % (ref 0.0–0.2)

## 2022-12-31 LAB — COMPREHENSIVE METABOLIC PANEL
ALT: 31 U/L (ref 0–44)
AST: 50 U/L — ABNORMAL HIGH (ref 15–41)
Albumin: 3.5 g/dL (ref 3.5–5.0)
Alkaline Phosphatase: 56 U/L (ref 38–126)
Anion gap: 10 (ref 5–15)
BUN: 13 mg/dL (ref 6–20)
CO2: 21 mmol/L — ABNORMAL LOW (ref 22–32)
Calcium: 8.7 mg/dL — ABNORMAL LOW (ref 8.9–10.3)
Chloride: 103 mmol/L (ref 98–111)
Creatinine, Ser: 0.83 mg/dL (ref 0.61–1.24)
GFR, Estimated: >60 mL/min (ref >60)
Glucose, Bld: 107 mg/dL — ABNORMAL HIGH (ref 70–99)
Potassium: 3.7 mmol/L (ref 3.5–5.1)
Sodium: 134 mmol/L — ABNORMAL LOW (ref 135–145)
Total Bilirubin: 0.5 mg/dL (ref 0.3–1.2)
Total Protein: 8.6 g/dL — ABNORMAL HIGH (ref 6.5–8.1)

## 2022-12-31 LAB — MAGNESIUM: Magnesium: 1.8 mg/dL (ref 1.7–2.4)

## 2022-12-31 LAB — ETHANOL: Alcohol, Ethyl (B): 49 mg/dL — ABNORMAL HIGH (ref <10)

## 2022-12-31 LAB — SALICYLATE LEVEL: Salicylate Lvl: <7 mg/dL — ABNORMAL LOW (ref 7.0–30.0)

## 2022-12-31 LAB — ACETAMINOPHEN LEVEL: Acetaminophen (Tylenol), Serum: <10 ug/mL — ABNORMAL LOW (ref 10–30)

## 2022-12-31 MED ORDER — THIAMINE MONONITRATE 100 MG PO TABS
100.0000 mg | ORAL_TABLET | Freq: Every day | ORAL | Status: DC
  Administered 2023-01-01: 100 mg via ORAL
  Filled 2022-12-31: qty 1

## 2022-12-31 MED ORDER — DIVALPROEX SODIUM 500 MG PO DR TAB
500.0000 mg | DELAYED_RELEASE_TABLET | Freq: Two times a day (BID) | ORAL | Status: DC
  Administered 2022-12-31 – 2023-01-01 (×2): 500 mg via ORAL
  Filled 2022-12-31 (×2): qty 1

## 2022-12-31 MED ORDER — ATORVASTATIN CALCIUM 40 MG PO TABS
40.0000 mg | ORAL_TABLET | Freq: Every day | ORAL | Status: DC
  Administered 2023-01-01: 40 mg via ORAL
  Filled 2022-12-31: qty 1

## 2022-12-31 MED ORDER — SODIUM CHLORIDE 0.9 % IV BOLUS
1000.0000 mL | Freq: Once | INTRAVENOUS | Status: AC
  Administered 2022-12-31: 1000 mL via INTRAVENOUS

## 2022-12-31 MED ORDER — TRAZODONE HCL 50 MG PO TABS
50.0000 mg | ORAL_TABLET | Freq: Every day | ORAL | Status: DC
  Administered 2022-12-31: 50 mg via ORAL
  Filled 2022-12-31: qty 1

## 2022-12-31 MED ORDER — BUSPIRONE HCL 10 MG PO TABS
10.0000 mg | ORAL_TABLET | Freq: Three times a day (TID) | ORAL | Status: DC
  Administered 2022-12-31 – 2023-01-01 (×3): 10 mg via ORAL
  Filled 2022-12-31 (×3): qty 1

## 2022-12-31 MED ORDER — DIPHENHYDRAMINE HCL 50 MG/ML IJ SOLN
25.0000 mg | Freq: Once | INTRAMUSCULAR | Status: AC
  Administered 2022-12-31: 25 mg via INTRAVENOUS
  Filled 2022-12-31: qty 1

## 2022-12-31 MED ORDER — AMLODIPINE BESYLATE 5 MG PO TABS
5.0000 mg | ORAL_TABLET | Freq: Every day | ORAL | Status: DC
  Administered 2023-01-01: 5 mg via ORAL
  Filled 2022-12-31: qty 1

## 2022-12-31 MED ORDER — GABAPENTIN 300 MG PO CAPS
300.0000 mg | ORAL_CAPSULE | Freq: Three times a day (TID) | ORAL | Status: DC
  Administered 2022-12-31 – 2023-01-01 (×3): 300 mg via ORAL
  Filled 2022-12-31 (×3): qty 1

## 2022-12-31 MED ORDER — DIPHENHYDRAMINE HCL 25 MG PO CAPS: 25.0000 mg | ORAL_CAPSULE | Freq: Once | ORAL | Status: DC

## 2022-12-31 MED ORDER — METOCLOPRAMIDE HCL 5 MG/ML IJ SOLN
10.0000 mg | Freq: Once | INTRAMUSCULAR | Status: AC
  Administered 2022-12-31: 10 mg via INTRAVENOUS
  Filled 2022-12-31: qty 2

## 2022-12-31 MED ORDER — FOLIC ACID 1 MG PO TABS
1.0000 mg | ORAL_TABLET | Freq: Every day | ORAL | Status: DC
  Administered 2023-01-01: 1 mg via ORAL
  Filled 2022-12-31: qty 1

## 2022-12-31 NOTE — ED Notes (Signed)
Pt will not answer questions, continues to want to leave. Mother present and trying to get him to stay for help.
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
## 2022-12-31 NOTE — ED Notes (Addendum)
Pt states "I am good" and leaves ER. Dr. Posey Rea notified of pt elopement
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
## 2022-12-31 NOTE — ED Notes (Signed)
Save blue and red tube in main lab °

## 2022-12-31 NOTE — ED Notes (Signed)
[facility] case # [id number]

## 2023-01-01 ENCOUNTER — Encounter: Payer: Self-pay | Admitting: Psychiatry

## 2023-01-01 ENCOUNTER — Other Ambulatory Visit: Payer: Self-pay

## 2023-01-01 ENCOUNTER — Inpatient Hospital Stay
Admission: AD | Admit: 2023-01-01 | Discharge: 2023-01-07 | DRG: 897 | Disposition: A | Discharge status: Home / Self Care | Payer: Medicaid Other | Source: Intra-hospital | Attending: Psychiatry | Admitting: Psychiatry

## 2023-01-01 DIAGNOSIS — F1010 Alcohol abuse, uncomplicated: Secondary | ICD-10-CM | POA: Diagnosis present

## 2023-01-01 DIAGNOSIS — Z85528 Personal history of other malignant neoplasm of kidney: Secondary | ICD-10-CM | POA: Diagnosis not present

## 2023-01-01 DIAGNOSIS — E7800 Pure hypercholesterolemia, unspecified: Secondary | ICD-10-CM | POA: Diagnosis present

## 2023-01-01 DIAGNOSIS — F17210 Nicotine dependence, cigarettes, uncomplicated: Secondary | ICD-10-CM | POA: Diagnosis present

## 2023-01-01 DIAGNOSIS — F209 Schizophrenia, unspecified: Secondary | ICD-10-CM | POA: Diagnosis present

## 2023-01-01 DIAGNOSIS — Z9151 Personal history of suicidal behavior: Secondary | ICD-10-CM

## 2023-01-01 DIAGNOSIS — F1420 Cocaine dependence, uncomplicated: Secondary | ICD-10-CM | POA: Diagnosis present

## 2023-01-01 DIAGNOSIS — Z83438 Family history of other disorder of lipoprotein metabolism and other lipidemia: Secondary | ICD-10-CM

## 2023-01-01 DIAGNOSIS — Z823 Family history of stroke: Secondary | ICD-10-CM | POA: Diagnosis not present

## 2023-01-01 DIAGNOSIS — F315 Bipolar disorder, current episode depressed, severe, with psychotic features: Secondary | ICD-10-CM

## 2023-01-01 DIAGNOSIS — F4310 Post-traumatic stress disorder, unspecified: Secondary | ICD-10-CM | POA: Diagnosis present

## 2023-01-01 DIAGNOSIS — Z8249 Family history of ischemic heart disease and other diseases of the circulatory system: Secondary | ICD-10-CM

## 2023-01-01 DIAGNOSIS — I739 Peripheral vascular disease, unspecified: Secondary | ICD-10-CM | POA: Diagnosis present

## 2023-01-01 DIAGNOSIS — Z62810 Personal history of physical and sexual abuse in childhood: Secondary | ICD-10-CM

## 2023-01-01 DIAGNOSIS — Z905 Acquired absence of kidney: Secondary | ICD-10-CM | POA: Diagnosis not present

## 2023-01-01 DIAGNOSIS — I10 Essential (primary) hypertension: Secondary | ICD-10-CM | POA: Diagnosis present

## 2023-01-01 DIAGNOSIS — F1090 Alcohol use, unspecified, uncomplicated: Secondary | ICD-10-CM | POA: Diagnosis present

## 2023-01-01 DIAGNOSIS — R45851 Suicidal ideations: Secondary | ICD-10-CM | POA: Diagnosis present

## 2023-01-01 DIAGNOSIS — Z5982 Transportation insecurity: Secondary | ICD-10-CM | POA: Diagnosis not present

## 2023-01-01 DIAGNOSIS — Z5901 Sheltered homelessness: Secondary | ICD-10-CM | POA: Diagnosis not present

## 2023-01-01 DIAGNOSIS — Z5941 Food insecurity: Secondary | ICD-10-CM

## 2023-01-01 DIAGNOSIS — Z9013 Acquired absence of bilateral breasts and nipples: Secondary | ICD-10-CM

## 2023-01-01 DIAGNOSIS — F329 Major depressive disorder, single episode, unspecified: Secondary | ICD-10-CM | POA: Diagnosis present

## 2023-01-01 DIAGNOSIS — F14288 Cocaine dependence with other cocaine-induced disorder: Secondary | ICD-10-CM | POA: Diagnosis present

## 2023-01-01 DIAGNOSIS — F1014 Alcohol abuse with alcohol-induced mood disorder: Secondary | ICD-10-CM | POA: Diagnosis present

## 2023-01-01 DIAGNOSIS — I2510 Atherosclerotic heart disease of native coronary artery without angina pectoris: Secondary | ICD-10-CM | POA: Diagnosis present

## 2023-01-01 DIAGNOSIS — Z885 Allergy status to narcotic agent status: Secondary | ICD-10-CM

## 2023-01-01 DIAGNOSIS — K7460 Unspecified cirrhosis of liver: Secondary | ICD-10-CM | POA: Diagnosis present

## 2023-01-01 DIAGNOSIS — Z8616 Personal history of COVID-19: Secondary | ICD-10-CM | POA: Diagnosis not present

## 2023-01-01 DIAGNOSIS — Z853 Personal history of malignant neoplasm of breast: Secondary | ICD-10-CM | POA: Diagnosis not present

## 2023-01-01 DIAGNOSIS — F332 Major depressive disorder, recurrent severe without psychotic features: Secondary | ICD-10-CM | POA: Diagnosis not present

## 2023-01-01 DIAGNOSIS — F411 Generalized anxiety disorder: Secondary | ICD-10-CM

## 2023-01-01 DIAGNOSIS — Z91414 Personal history of adult intimate partner abuse: Secondary | ICD-10-CM

## 2023-01-01 DIAGNOSIS — Z880 Allergy status to penicillin: Secondary | ICD-10-CM

## 2023-01-01 DIAGNOSIS — Z91018 Allergy to other foods: Secondary | ICD-10-CM

## 2023-01-01 LAB — RAPID URINE DRUG SCREEN, HOSP PERFORMED
Amphetamines: NOT DETECTED
Barbiturates: NOT DETECTED
Benzodiazepines: NOT DETECTED
Cocaine: POSITIVE — AB
Opiates: NOT DETECTED
Tetrahydrocannabinol: NOT DETECTED

## 2023-01-01 LAB — ACETAMINOPHEN LEVEL: Acetaminophen (Tylenol), Serum: <10 ug/mL — ABNORMAL LOW (ref 10–30)

## 2023-01-01 MED ORDER — LORAZEPAM 2 MG/ML IJ SOLN: 1.0000 mg | INTRAMUSCULAR | Status: AC | PRN

## 2023-01-01 MED ORDER — GABAPENTIN 300 MG PO CAPS
300.0000 mg | ORAL_CAPSULE | Freq: Three times a day (TID) | ORAL | Status: DC
  Administered 2023-01-03 – 2023-01-07 (×14): 300 mg via ORAL
  Filled 2023-01-01 (×15): qty 1

## 2023-01-01 MED ORDER — MAGNESIUM HYDROXIDE 400 MG/5ML PO SUSP: 30.0000 mL | Freq: Every day | ORAL | Status: DC | PRN

## 2023-01-01 MED ORDER — LORAZEPAM 1 MG PO TABS
1.0000 mg | ORAL_TABLET | Freq: Once | ORAL | Status: AC
  Administered 2023-01-01: 1 mg via ORAL
  Filled 2023-01-01: qty 1

## 2023-01-01 MED ORDER — LORAZEPAM 2 MG/ML IJ SOLN: 2.0000 mg | Freq: Three times a day (TID) | INTRAMUSCULAR | Status: DC | PRN

## 2023-01-01 MED ORDER — AMLODIPINE BESYLATE 5 MG PO TABS
5.0000 mg | ORAL_TABLET | Freq: Every day | ORAL | Status: DC
  Administered 2023-01-03 – 2023-01-07 (×5): 5 mg via ORAL
  Filled 2023-01-01 (×6): qty 1

## 2023-01-01 MED ORDER — DIPHENHYDRAMINE HCL 25 MG PO CAPS
50.0000 mg | ORAL_CAPSULE | Freq: Three times a day (TID) | ORAL | Status: DC | PRN
  Administered 2023-01-01 – 2023-01-02 (×3): 50 mg via ORAL
  Filled 2023-01-01 (×3): qty 2

## 2023-01-01 MED ORDER — LORAZEPAM 2 MG PO TABS
2.0000 mg | ORAL_TABLET | Freq: Three times a day (TID) | ORAL | Status: DC | PRN
  Administered 2023-01-02 – 2023-01-06 (×4): 2 mg via ORAL
  Filled 2023-01-01 (×4): qty 1

## 2023-01-01 MED ORDER — ENSURE ENLIVE PO LIQD
237.0000 mL | Freq: Two times a day (BID) | ORAL | Status: DC
  Administered 2023-01-02 – 2023-01-07 (×8): 237 mL via ORAL

## 2023-01-01 MED ORDER — ATORVASTATIN CALCIUM 20 MG PO TABS
40.0000 mg | ORAL_TABLET | Freq: Every day | ORAL | Status: DC
  Administered 2023-01-03 – 2023-01-07 (×5): 40 mg via ORAL
  Filled 2023-01-01 (×5): qty 2

## 2023-01-01 MED ORDER — DIPHENHYDRAMINE HCL 50 MG/ML IJ SOLN: 50.0000 mg | Freq: Three times a day (TID) | INTRAMUSCULAR | Status: DC | PRN

## 2023-01-01 MED ORDER — TRAZODONE HCL 50 MG PO TABS
50.0000 mg | ORAL_TABLET | Freq: Every evening | ORAL | Status: DC | PRN
  Administered 2023-01-02: 50 mg via ORAL
  Filled 2023-01-01 (×2): qty 1

## 2023-01-01 MED ORDER — HYDROXYZINE HCL 25 MG PO TABS
25.0000 mg | ORAL_TABLET | Freq: Three times a day (TID) | ORAL | Status: DC | PRN
  Administered 2023-01-01: 25 mg via ORAL
  Filled 2023-01-01: qty 1

## 2023-01-01 MED ORDER — LORAZEPAM 0.5 MG PO TABS: 0.5000 mg | ORAL_TABLET | Freq: Once | ORAL | Status: DC

## 2023-01-01 MED ORDER — FOLIC ACID 1 MG PO TABS
1.0000 mg | ORAL_TABLET | Freq: Every day | ORAL | Status: DC
  Administered 2023-01-04 – 2023-01-07 (×4): 1 mg via ORAL
  Filled 2023-01-01 (×6): qty 1

## 2023-01-01 MED ORDER — THIAMINE MONONITRATE 100 MG PO TABS
100.0000 mg | ORAL_TABLET | Freq: Every day | ORAL | Status: DC
  Administered 2023-01-04 – 2023-01-07 (×4): 100 mg via ORAL
  Filled 2023-01-01 (×6): qty 1

## 2023-01-01 MED ORDER — LORAZEPAM 1 MG PO TABS
1.0000 mg | ORAL_TABLET | ORAL | Status: AC | PRN
  Administered 2023-01-01: 2 mg via ORAL
  Administered 2023-01-01 – 2023-01-04 (×4): 1 mg via ORAL
  Filled 2023-01-01 (×4): qty 1
  Filled 2023-01-01: qty 2
  Filled 2023-01-01: qty 1

## 2023-01-01 MED ORDER — DIVALPROEX SODIUM 500 MG PO DR TAB
500.0000 mg | DELAYED_RELEASE_TABLET | Freq: Two times a day (BID) | ORAL | Status: DC
  Administered 2023-01-01 – 2023-01-07 (×11): 500 mg via ORAL
  Filled 2023-01-01 (×12): qty 1

## 2023-01-01 MED ORDER — THIAMINE HCL 100 MG/ML IJ SOLN
100.0000 mg | Freq: Every day | INTRAMUSCULAR | Status: DC
  Filled 2023-01-01 (×6): qty 1

## 2023-01-01 MED ORDER — BUSPIRONE HCL 5 MG PO TABS
10.0000 mg | ORAL_TABLET | Freq: Three times a day (TID) | ORAL | Status: DC
  Administered 2023-01-03 – 2023-01-07 (×13): 10 mg via ORAL
  Filled 2023-01-01 (×16): qty 2

## 2023-01-01 MED ORDER — ADULT MULTIVITAMIN W/MINERALS CH
1.0000 | ORAL_TABLET | Freq: Every day | ORAL | Status: DC
  Administered 2023-01-01 – 2023-01-07 (×6): 1 via ORAL
  Filled 2023-01-01 (×7): qty 1

## 2023-01-01 MED ORDER — ALUM & MAG HYDROXIDE-SIMETH 200-200-20 MG/5ML PO SUSP: 30.0000 mL | ORAL | Status: DC | PRN

## 2023-01-01 NOTE — Plan of Care (Signed)
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
## 2023-01-01 NOTE — Progress Notes (Signed)
Pt was accepted to [facility] [address] [place], [place], [place] [address] TODAY 01/01/2023; Bed Assignment 309 PENDING signed voluntary consent faxed to [phone]  DX:MDD  Pt meets inpatient criteria per Alona Bene, PMHNP  Attending Physician will be Dr.John Clapacs,MD  Report can be called to: [phone]. Per [facility] AC,Provider, please include admission orders. Registration, please pre-admit.   Pt can arrive after:[facility] and Charge [facility] RN will coordinate with care team.  Care Team notified: Day [facility] AC Sharyne Peach, RN, [employer], PMHNP,Beth Kelvin Cellar [facility], LCSWA 01/01/2023 @ 12:04 PM
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
## 2023-01-01 NOTE — ED Notes (Signed)
Per Roselyn Bering, NP, Pt is recommended for inpatient psychiatric treatment.

## 2023-01-01 NOTE — ED Provider Notes (Signed)
Patient accepted to [facility] to Dr. Toni Amend.  Stable throughout my care.   Virgina Norfolk, DO 01/01/23 1612

## 2023-01-01 NOTE — ED Provider Notes (Signed)
Emergency Medicine Observation Re-evaluation Note  [name] seen on rounds today.  Pt initially presented to the ED for complaints of Suicidal She jumped out of a car and drank isopropyl alcohol. Currently, the patient is awaiting treatment.  Sleeping.   Physical Exam  BP (!) 141/98 (BP Location: Left Arm)   Pulse (!) 102   Temp 98.7 F (37.1 C) (Oral)   Resp 20   Ht 5\' 6"  (1.676 m)   Wt 72 kg   SpO2 97%   BMI 25.62 kg/m  Physical Exam General: NAD  Cardiac: RR Lungs: even, unlabored Psych: NAD  ED Course / MDM  EKG:EKG Interpretation  Date/Time:  Friday December 31 2022 19:33:00 EDT Ventricular Rate:  109 PR Interval:  120 QRS Duration: 88 QT Interval:  347 QTC Calculation: 468 R Axis:   65 Text Interpretation: Sinus tachycardia Confirmed by Bethann Berkshire [phone]) on 12/31/2022 8:08:03 PM  I have reviewed the labs performed to date as well as medications administered while in observation.  Recent changes in the last 24 hours include arrival to ED.  Plan  Current plan is for inpatient psychiatric care.    [name] Monday, MD 01/01/23 2232
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
## 2023-01-02 DIAGNOSIS — F332 Major depressive disorder, recurrent severe without psychotic features: Secondary | ICD-10-CM

## 2023-01-02 LAB — HIV ANTIBODY (ROUTINE TESTING W REFLEX): HIV Screen 4th Generation wRfx: NONREACTIVE
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
## 2023-01-02 NOTE — Progress Notes (Addendum)
The patient remained in her roof for the evening, but approached the nurses' station to make her needs known.  She was provided withy food, fluids, and evening medications.  [name] continues to endorse suicidal ideation, but stated that she will keep herself safe while in the BMU.  The patient denied homicidal ideation.  Auditory hallucinations were also endorsed, but the patient was not observed responding to internal stimuli.    [name] is a High Fall Risk due to alcohol withdrawal.  CIWA scores and blood pressure reading remain elevated.  Fluids pushed.  The patient will be monitored closely for unsafe behaviors.

## 2023-01-02 NOTE — Group Note (Signed)
Date:  01/02/2023 Time:  11:20 AM  Group Topic/Focus:  Activity Group  Due to the patients being on the unit majority of the day, I encourage the patients to go outside in the courtyard to get some fresh air and some exercise to promote wellbeing physically and mentally.      Participation Level:  Did Not Attend   Kita Neace M Lajoya Dombek 01/02/2023, 11:20 AM  

## 2023-01-02 NOTE — Plan of Care (Signed)
PT reports active SI, will not communicate plan. Pt is minimal with assessment. Pt refuses medications today, provider made aware. Pt is isolated to bed. Is not adherent with care. No signs of distress or injury. Staff will monitor q15 for safety.    Problem: Education: Goal: Knowledge of General Education information will improve Description: Including pain rating scale, medication(s)/side effects and non-pharmacologic comfort measures Outcome: Not Progressing   Problem: Health Behavior/Discharge Planning: Goal: Ability to manage health-related needs will improve Outcome: Not Progressing   Problem: Clinical Measurements: Goal: Ability to maintain clinical measurements within normal limits will improve Outcome: Not Progressing

## 2023-01-02 NOTE — BHH Counselor (Signed)
The LCSWA attempted to complete the Adult Assessment with the patient but the patient did not want to answer any questions. The patient stated that he was not suppose to be at the [facility] but at [facility]. The patient declined to provide a contact number for someone for suicide prevention education.    Patric Dykes, LCSWA 01/02/2023

## 2023-01-02 NOTE — Progress Notes (Signed)
The patient was more cooperative with treatment and staff during the evening hours, approaching the nurses' station to request evening medications an a snack.  Mood and affect were blunted.  The patient stated that she believed she was having decreased withdrawal symptoms.  [name] continues to endorse suicidal thoughts, but stated she would remain safe while hospitalized.  The patient was provided PRN trazodone before she fell asleep at 2200.

## 2023-01-02 NOTE — [facility] Suicide Risk Assessment (Signed)
[facility] Admission Suicide Risk Assessment   Nursing information obtained from:  Patient, Review of record Demographic factors:  Gay, lesbian, or bisexual orientation, Low socioeconomic status, Unemployed Current Mental Status:  Suicidal ideation indicated by patient, Self-harm thoughts, Self-harm behaviors Loss Factors:  Decrease in vocational status, Decline in physical health, Financial problems / change in socioeconomic status Historical Factors:  Prior suicide attempts, Impulsivity Risk Reduction Factors:  Positive coping skills or problem solving skills  Total Time spent with patient: 45 minutes Principal Problem: MDD (major depressive disorder) Diagnosis:  Principal Problem:   MDD (major depressive disorder) Active Problems:   Alcohol abuse   Cocaine dependence syndrome (HCC)  Subjective Data: Patient seen and chart reviewed.  42 year old transgender woman with a long history of mood symptoms and a long history of suicidal threats and ideas and substance abuse transferred to [place] from [place] under IVC.  Patient does not want to talk with me much today.  Answered only a few questions.  Would not answer about active suicidal ideation but has not been acting out in the hospital.  Denied having any hallucinations.  Stated that she felt tired.  Continued Clinical Symptoms:  Alcohol Use Disorder Identification Test Final Score (AUDIT): 40 The "Alcohol Use Disorders Identification Test", Guidelines for Use in Primary Care, Second Edition.  World Science writer [facility]). Score between 0-7:  no or low risk or alcohol related problems. Score between 8-15:  moderate risk of alcohol related problems. Score between 16-19:  high risk of alcohol related problems. Score 20 or above:  warrants further diagnostic evaluation for alcohol dependence and treatment.   CLINICAL FACTORS:   Depression:   Comorbid alcohol abuse/dependence Alcohol/Substance Abuse/Dependencies   Musculoskeletal: Strength  & Muscle Tone: within normal limits Gait & Station: normal Patient leans: N/A  Psychiatric Specialty Exam:  Presentation  General Appearance:  Appropriate for Environment  Eye Contact: Good  Speech: Clear and Coherent  Speech Volume: Normal  Handedness: Right   Mood and Affect  Mood: Depressed  Affect: Depressed; Flat   Thought Process  Thought Processes: Coherent  Descriptions of Associations:Intact  Orientation:Full (Time, Place and Person)  Thought Content:WDL  History of Schizophrenia/Schizoaffective disorder:No  Duration of Psychotic Symptoms:N/A  Hallucinations:Hallucinations: None  Ideas of Reference:None  Suicidal Thoughts:Suicidal Thoughts: Yes, Active SI Active Intent and/or Plan: With Intent  Homicidal Thoughts:Homicidal Thoughts: No   Sensorium  Memory: Immediate Good; Recent Good; Remote Fair  Judgment: Fair  Insight: Fair   Chartered certified accountant: Fair  Attention Span: Fair  Recall: [employer] of Knowledge: Fair  Language: Fair   Psychomotor Activity  Psychomotor Activity: Psychomotor Activity: Normal   Assets  Assets: Manufacturing systems engineer; Desire for Improvement; Social Support   Sleep  Sleep: Sleep: Poor    Physical Exam: Physical Exam Vitals and nursing note reviewed.  Constitutional:      General: She is in acute distress.     Appearance: She is ill-appearing.  HENT:     Head: Normocephalic and atraumatic.     Mouth/Throat:     Pharynx: Oropharynx is clear.  Eyes:     Pupils: Pupils are equal, round, and reactive to light.  Cardiovascular:     Rate and Rhythm: Normal rate and regular rhythm.  Pulmonary:     Effort: Pulmonary effort is normal.     Breath sounds: Normal breath sounds.  Abdominal:     General: Abdomen is flat.     Palpations: Abdomen is soft.  Musculoskeletal:  General: Normal range of motion.  Skin:    General: Skin is warm and dry.   Neurological:     General: No focal deficit present.     Mental Status: She is alert. Mental status is at baseline.  Psychiatric:        Attention and Perception: She is inattentive.        Mood and Affect: Mood normal. Affect is blunt.        Speech: She is noncommunicative.        Behavior: Behavior is withdrawn.        Cognition and Memory: Cognition is impaired.    Review of Systems  Unable to perform ROS: Psychiatric disorder   Blood pressure (!) 121/94, pulse (!) 145, temperature 98.7 F (37.1 C), temperature source Oral, resp. rate 18, height 5\' 6"  (1.676 m), weight 71.2 kg, SpO2 98 %. Body mass index is 25.34 kg/m.   COGNITIVE FEATURES THAT CONTRIBUTE TO RISK:  Loss of executive function    SUICIDE RISK:   Mild:  Suicidal ideation of limited frequency, intensity, duration, and specificity.  There are no identifiable plans, no associated intent, mild dysphoria and related symptoms, good self-control (both objective and subjective assessment), few other risk factors, and identifiable protective factors, including available and accessible social support.  PLAN OF CARE: Continue monitoring for 15-minute checks.  Continue detox medicine.  Continue orders for most recent psychiatric medicine.  Engage when possible in individual and group therapy and ongoing assessment of dangerousness prior to discharge  I certify that inpatient services furnished can reasonably be expected to improve the patient's condition.   Mordecai Rasmussen, MD 01/02/2023, 1:13 PM

## 2023-01-02 NOTE — Progress Notes (Signed)
The patient requested PRN medication for a feeling of "bugs under the skin."  He was observed scratching his arms and appeared in discomfort.  Ativan and Benadryl provided at 2315.  Will monitor for effectiveness.

## 2023-01-03 DIAGNOSIS — F332 Major depressive disorder, recurrent severe without psychotic features: Secondary | ICD-10-CM | POA: Diagnosis not present

## 2023-01-03 MED ORDER — IBUPROFEN 600 MG PO TABS: 600.0000 mg | ORAL_TABLET | Freq: Four times a day (QID) | ORAL | Status: DC | PRN

## 2023-01-03 MED ORDER — DOUBLE ANTIBIOTIC 500-10000 UNIT/GM EX OINT
TOPICAL_OINTMENT | Freq: Two times a day (BID) | CUTANEOUS | Status: DC
  Administered 2023-01-07: 1 via TOPICAL
  Filled 2023-01-03: qty 28.4

## 2023-01-03 MED ORDER — HYDROXYZINE HCL 50 MG PO TABS
50.0000 mg | ORAL_TABLET | Freq: Three times a day (TID) | ORAL | Status: DC | PRN
  Administered 2023-01-03 – 2023-01-04 (×2): 50 mg via ORAL
  Filled 2023-01-03 (×2): qty 1

## 2023-01-03 MED ORDER — TRAZODONE HCL 100 MG PO TABS
100.0000 mg | ORAL_TABLET | Freq: Every evening | ORAL | Status: DC | PRN
  Administered 2023-01-03 – 2023-01-06 (×4): 100 mg via ORAL
  Filled 2023-01-03 (×4): qty 1
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
## 2023-01-03 NOTE — Progress Notes (Signed)
Patient is asleep on CIWA reassessment. This writer reported off to the oncoming shift to re-check patient vitals.   01/03/23 1849  CIWA-Ar  Nausea and Vomiting 0  Tactile Disturbances 0  Tremor 0  Auditory Disturbances 0  Paroxysmal Sweats 0  Visual Disturbances 0  Anxiety 0  Headache, Fullness in Head 0  Agitation 0  Orientation and Clouding of Sensorium 0  CIWA-Ar Total 0

## 2023-01-03 NOTE — Progress Notes (Signed)
Patient refused scheduled folic acid and B1 vitamin, stating "they make me sick".

## 2023-01-03 NOTE — Progress Notes (Signed)
Patient calm and pleasant during assessment. Pt endorses passive SI, verbally contracts for safety. Pt isolative to her room tonight. Pt did get snack. Pt compliant with medication administration per MD orders. Pt given education, support, and encouragement to be active in her treatment plan. Pt being monitored Q 15 minutes for safety per unit protocol, remains safe on the unit

## 2023-01-03 NOTE — Progress Notes (Signed)
Patient given PRN Ativan to help with withdrawal symptoms.   01/03/23 1749  Vital Signs  Temp 99.1 F (37.3 C)  Temp Source Oral  Pulse Rate (!) 128  Pulse Rate Source Monitor  BP (!) 134/101  BP Location Left Arm  BP Method Automatic  Patient Position (if appropriate) Sitting  Oxygen Therapy  SpO2 97 %  O2 Device Room Air  CIWA-Ar  Nausea and Vomiting 0  Tactile Disturbances 0  Tremor 0  Auditory Disturbances 2  Paroxysmal Sweats 0  Visual Disturbances 0  Anxiety 3  Headache, Fullness in Head 0  Agitation 0  Orientation and Clouding of Sensorium 0  CIWA-Ar Total 5

## 2023-01-03 NOTE — Progress Notes (Signed)
Nutrition Brief Note  Patient identified on the Malnutrition Screening Tool (MST) Report  42 y/o transgender male with h/o breast cancer s/p bilateral mastectomy, kidney cancer s/p nephrectomy (2014), PTSD, etoh and cocaine abuse, bipolar disorder, cirrhosis, SI, withdrawal seizures and depression who is admitted with depression and SI.   Wt Readings from Last 15 Encounters:  01/01/23 71.2 kg  12/31/22 72 kg  12/31/22 72 kg  12/11/22 72 kg  11/29/22 72.3 kg  11/18/22 72.3 kg  11/17/22 72.6 kg  07/10/22 73.8 kg  06/25/22 73.9 kg  06/06/22 71.7 kg  05/10/22 73.9 kg  02/11/22 73.6 kg  12/16/21 70.9 kg  11/16/21 69.4 kg  10/17/21 72.6 kg    Body mass index is 25.34 kg/m. Patient meets criteria for normal weight based on current BMI.   Current diet order is regular. Pt noted to be eating 100% of meals.   No nutrition interventions warranted at this time. If nutrition issues arise, please consult RD.   Betsey Holiday MS, RD, LDN Please refer to [facility] for RD and/or RD on-call/weekend/after hours pager

## 2023-01-03 NOTE — Group Note (Signed)
Recreation Therapy Group Note   Group Topic:Goal Setting  Group Date: 01/03/2023 Start Time: 1000 End Time: 1100 Facilitators: Rosina Lowenstein, LRT, CTRS Location:  Craft Room  Group Description: Scientist, research (physical sciences). Patients were given many different magazines, a glue stick, markers, and a piece of cardstock paper. LRT and pts discussed the importance of having goals in life. LRT and pts discussed the difference between short-term and long-term goals, as well as what a SMART goal is. LRT encouraged pts to create a vision board, with images they picked and then cut out with safety scissors from the magazine, for themselves, that capture their short and long-term goals. LRT encouraged pts to show and explain their vision board to the group. LRT offered to laminate vision board once dry and complete.   Goal Area(s) Addressed:  Patient will gain knowledge of short vs. long term goals.  Patient will identify goals for themselves. Patient will practice setting SMART goals. Patient will verbalize their goals to LRT and peers.  Affect/Mood: N/A   Participation Level: Did not attend    Clinical Observations/Individualized Feedback: [name] did not attend group due to resting in their room.  Plan: Continue to engage patient in RT group sessions 2-3x/week.   Rosina Lowenstein, LRT, CTRS 01/03/2023 11:17 AM

## 2023-01-03 NOTE — BH IP Treatment Plan (Signed)
Interdisciplinary Treatment and Diagnostic Plan Update  01/03/2023 Time of Session: 9:00 AM  [name] MRN: [id number]  Principal Diagnosis: MDD (major depressive disorder)  Secondary Diagnoses: Principal Problem:   MDD (major depressive disorder) Active Problems:   Alcohol abuse   Cocaine dependence syndrome (HCC)   Current Medications:  Current Facility-Administered Medications  Medication Dose Route Frequency Provider Last Rate Last Admin   alum & mag hydroxide-simeth (MAALOX/MYLANTA) 200-200-20 MG/5ML suspension 30 mL  30 mL Oral Q4H PRN Motley-Mangrum, Jadeka A, PMHNP       amLODipine (NORVASC) tablet 5 mg  5 mg Oral Daily Motley-Mangrum, Jadeka A, PMHNP   5 mg at 01/03/23 0911   atorvastatin (LIPITOR) tablet 40 mg  40 mg Oral Daily Motley-Mangrum, Jadeka A, PMHNP   40 mg at 01/03/23 0911   busPIRone (BUSPAR) tablet 10 mg  10 mg Oral TID Motley-Mangrum, Jadeka A, PMHNP   10 mg at 01/03/23 1237   diphenhydrAMINE (BENADRYL) capsule 50 mg  50 mg Oral TID PRN Motley-Mangrum, Jadeka A, PMHNP   50 mg at 01/02/23 2312   Or   diphenhydrAMINE (BENADRYL) injection 50 mg  50 mg Intramuscular TID PRN Motley-Mangrum, Jadeka A, PMHNP       divalproex (DEPAKOTE) DR tablet 500 mg  500 mg Oral Q12H Motley-Mangrum, Jadeka A, PMHNP   500 mg at 01/03/23 0911   feeding supplement (ENSURE ENLIVE / ENSURE PLUS) liquid 237 mL  237 mL Oral BID BM Clapacs, John T, MD   237 mL at 01/03/23 1600   folic acid (FOLVITE) tablet 1 mg  1 mg Oral Daily Clapacs, John T, MD       gabapentin (NEURONTIN) capsule 300 mg  300 mg Oral TID Motley-Mangrum, Jadeka A, PMHNP   300 mg at 01/03/23 1237   hydrOXYzine (ATARAX) tablet 50 mg  50 mg Oral TID PRN Clapacs, Jackquline Denmark, MD       ibuprofen (ADVIL) tablet 600 mg  600 mg Oral Q6H PRN Clapacs, Jackquline Denmark, MD       LORazepam (ATIVAN) tablet 1-4 mg  1-4 mg Oral Q1H PRN Clapacs, Jackquline Denmark, MD   1 mg at 01/01/23 2149   Or   LORazepam (ATIVAN) injection 1-4 mg  1-4 mg Intravenous Q1H PRN  Clapacs, Jackquline Denmark, MD       LORazepam (ATIVAN) tablet 2 mg  2 mg Oral TID PRN Motley-Mangrum, Jadeka A, PMHNP   2 mg at 01/02/23 2312   Or   LORazepam (ATIVAN) injection 2 mg  2 mg Intramuscular TID PRN Motley-Mangrum, Jadeka A, PMHNP       magnesium hydroxide (MILK OF MAGNESIA) suspension 30 mL  30 mL Oral Daily PRN Motley-Mangrum, Jadeka A, PMHNP       multivitamin with minerals tablet 1 tablet  1 tablet Oral Daily Clapacs, Jackquline Denmark, MD   1 tablet at 01/03/23 2536   polymixin-bacitracin (POLYSPORIN) ointment   Topical BID Clapacs, John T, MD       thiamine (VITAMIN B1) tablet 100 mg  100 mg Oral Daily Clapacs, John T, MD       Or   thiamine (VITAMIN B1) injection 100 mg  100 mg Intravenous Daily Clapacs, John T, MD       traZODone (DESYREL) tablet 100 mg  100 mg Oral QHS PRN Clapacs, Jackquline Denmark, MD       PTA Medications: Medications Prior to Admission  Medication Sig Dispense Refill Last Dose   amLODipine (NORVASC) 5 MG tablet Take 1  tablet (5 mg total) by mouth daily. (Patient not taking: Reported on 01/03/2023) 60 tablet 1 Not Taking   atorvastatin (LIPITOR) 40 MG tablet Take 1 tablet (40 mg total) by mouth daily. (Patient not taking: Reported on 01/03/2023) 60 tablet 1 Not Taking   busPIRone (BUSPAR) 10 MG tablet Take 1 tablet (10 mg total) by mouth 3 (three) times daily. (Patient not taking: Reported on 01/03/2023) 90 tablet 0 Not Taking   divalproex (DEPAKOTE) 500 MG DR tablet Take 1 tablet (500 mg total) by mouth every 12 (twelve) hours. 60 tablet 0    folic acid (FOLVITE) 1 MG tablet Take 1 tablet (1 mg total) by mouth daily. (Patient not taking: Reported on 01/03/2023)   Not Taking   gabapentin (NEURONTIN) 300 MG capsule Take 1 capsule (300 mg total) by mouth every 8 (eight) hours. (Patient not taking: Reported on 01/03/2023) 90 capsule 1 Not Taking   thiamine (VITAMIN B-1) 100 MG tablet Take 1 tablet (100 mg total) by mouth daily. (Patient not taking: Reported on 01/03/2023)   Not Taking    traZODone (DESYREL) 50 MG tablet Take 1 tablet (50 mg total) by mouth at bedtime. (Patient not taking: Reported on 01/03/2023) 30 tablet 0 Not Taking    Patient Stressors:    Patient Strengths:    Treatment Modalities: Medication Management, Group therapy, Case management,  1 to 1 session with clinician, Psychoeducation, Recreational therapy.   Physician Treatment Plan for Primary Diagnosis: MDD (major depressive disorder) Long Term Goal(s): Improvement in symptoms so as ready for discharge   Short Term Goals: Compliance with prescribed medications will improve Ability to verbalize feelings will improve Ability to disclose and discuss suicidal ideas  Medication Management: Evaluate patient's response, side effects, and tolerance of medication regimen.  Therapeutic Interventions: 1 to 1 sessions, Unit Group sessions and Medication administration.  Evaluation of Outcomes: Not Progressing  Physician Treatment Plan for Secondary Diagnosis: Principal Problem:   MDD (major depressive disorder) Active Problems:   Alcohol abuse   Cocaine dependence syndrome (HCC)  Long Term Goal(s): Improvement in symptoms so as ready for discharge   Short Term Goals: Compliance with prescribed medications will improve Ability to verbalize feelings will improve Ability to disclose and discuss suicidal ideas     Medication Management: Evaluate patient's response, side effects, and tolerance of medication regimen.  Therapeutic Interventions: 1 to 1 sessions, Unit Group sessions and Medication administration.  Evaluation of Outcomes: Not Progressing   RN Treatment Plan for Primary Diagnosis: MDD (major depressive disorder) Long Term Goal(s): Knowledge of disease and therapeutic regimen to maintain health will improve  Short Term Goals: Ability to remain free from injury will improve, Ability to verbalize frustration and anger appropriately will improve, Ability to participate in decision making will  improve, Ability to verbalize feelings will improve, Ability to identify and develop effective coping behaviors will improve, and Compliance with prescribed medications will improve  Medication Management: RN will administer medications as ordered by provider, will assess and evaluate patient's response and provide education to patient for prescribed medication. RN will report any adverse and/or side effects to prescribing provider.  Therapeutic Interventions: 1 on 1 counseling sessions, Psychoeducation, Medication administration, Evaluate responses to treatment, Monitor vital signs and CBGs as ordered, Perform/monitor CIWA, COWS, AIMS and Fall Risk screenings as ordered, Perform wound care treatments as ordered.  Evaluation of Outcomes: Not Progressing   LCSW Treatment Plan for Primary Diagnosis: MDD (major depressive disorder) Long Term Goal(s): Safe transition to appropriate next level  of care at discharge, Engage patient in therapeutic group addressing interpersonal concerns.  Short Term Goals: Engage patient in aftercare planning with referrals and resources, Increase social support, Increase ability to appropriately verbalize feelings, Increase emotional regulation, Facilitate acceptance of mental health diagnosis and concerns, Identify triggers associated with mental health/substance abuse issues, and Increase skills for wellness and recovery  Therapeutic Interventions: Assess for all discharge needs, 1 to 1 time with Social worker, Explore available resources and support systems, Assess for adequacy in community support network, Educate family and significant other(s) on suicide prevention, Complete Psychosocial Assessment, Interpersonal group therapy.  Evaluation of Outcomes: Not Progressing   Progress in Treatment: Attending groups: No. Participating in groups: No. Taking medication as prescribed: Yes. Toleration medication: Yes. Family/Significant other contact made: No, will  contact:  Pt declined contact at this time  Patient understands diagnosis: Yes. Discussing patient identified problems/goals with staff: Yes. Medical problems stabilized or resolved: Yes. Denies suicidal/homicidal ideation: No. Issues/concerns per patient self-inventory: No. Other: N/A  New problem(s) identified: No, Describe:     New Short Term/Long Term Goal(s):  Patient Goals:    Discharge Plan or Barriers:   Reason for Continuation of Hospitalization: Suicidal ideation  Estimated Length of Stay:  Last 3 [place] Suicide Severity Risk Score: Flowsheet Row Admission (Current) from 01/01/2023 in [facility] ED from 12/31/2022 in [facility] Emergency Department at [facility] ED from 12/11/2022 in [facility] Emergency Department at [facility]  C-SSRS RISK CATEGORY High Risk High Risk Error: Question 6 not populated       Last [facility] 2/9 Scores:    12/06/2022    2:43 PM 11/29/2022    2:41 PM 06/25/2022   11:51 AM  Depression screen PHQ 2/9  Decreased Interest 1 0 0  Down, Depressed, Hopeless 1 0 0  PHQ - 2 Score 2 0 0  Altered sleeping 1 1 0  Tired, decreased energy 1 0 0  Change in appetite 0 0 0  Feeling bad or failure about yourself  1 0 0  Trouble concentrating 1 0 0  Moving slowly or fidgety/restless 1 0 0  Suicidal thoughts 0 0 0  PHQ-9 Score 7 1 0  Difficult doing work/chores Very difficult Not difficult at all Not difficult at all    Scribe for Treatment Team: Elza Rafter, Theresia Majors 01/03/2023 4:38 PM

## 2023-01-03 NOTE — Plan of Care (Signed)
?  Problem: Education: ?Goal: Knowledge of General Education information will improve ?Description: Including pain rating scale, medication(s)/side effects and non-pharmacologic comfort measures ?Outcome: Not Progressing ?  ?Problem: Health Behavior/Discharge Planning: ?Goal: Ability to manage health-related needs will improve ?Outcome: Not Progressing ?  ?Problem: Clinical Measurements: ?Goal: Ability to maintain clinical measurements within normal limits will improve ?Outcome: Not Progressing ?Goal: Will remain free from infection ?Outcome: Not Progressing ?Goal: Diagnostic test results will improve ?Outcome: Not Progressing ?Goal: Respiratory complications will improve ?Outcome: Not Progressing ?Goal: Cardiovascular complication will be avoided ?Outcome: Not Progressing ?  ?Problem: Activity: ?Goal: Risk for activity intolerance will decrease ?Outcome: Not Progressing ?  ?Problem: Nutrition: ?Goal: Adequate nutrition will be maintained ?Outcome: Not Progressing ?  ?Problem: Coping: ?Goal: Level of anxiety will decrease ?Outcome: Not Progressing ?  ?Problem: Elimination: ?Goal: Will not experience complications related to bowel motility ?Outcome: Not Progressing ?Goal: Will not experience complications related to urinary retention ?Outcome: Not Progressing ?  ?Problem: Pain Managment: ?Goal: General experience of comfort will improve ?Outcome: Not Progressing ?  ?Problem: Safety: ?Goal: Ability to remain free from injury will improve ?Outcome: Not Progressing ?  ?Problem: Skin Integrity: ?Goal: Risk for impaired skin integrity will decrease ?Outcome: Not Progressing ?  ?Problem: Education: ?Goal: Knowledge of disease or condition will improve ?Outcome: Not Progressing ?Goal: Understanding of discharge needs will improve ?Outcome: Not Progressing ?  ?Problem: Health Behavior/Discharge Planning: ?Goal: Ability to identify changes in lifestyle to reduce recurrence of condition will improve ?Outcome: Not  Progressing ?Goal: Identification of resources available to assist in meeting health care needs will improve ?Outcome: Not Progressing ?  ?Problem: Physical Regulation: ?Goal: Complications related to the disease process, condition or treatment will be avoided or minimized ?Outcome: Not Progressing ?  ?Problem: Safety: ?Goal: Ability to remain free from injury will improve ?Outcome: Not Progressing ?  ?

## 2023-01-03 NOTE — Progress Notes (Signed)
   01/03/23 1755  Thought Process  Coherency Circumstantial  Content Blaming others  Delusions None reported or observed  Perception Hallucinations  Hallucination Auditory (pt. reports hearing voices "telling me to kill myself".)  Judgment Impaired  Confusion None

## 2023-01-03 NOTE — Progress Notes (Signed)
D- Patient alert and oriented. Patient presented in an anxious, but pleasant mood on assessment stating that she slept "horrible" last night, indicating that she may need medication to help with trouble sleeping. Patient endorsed a headache, as well as a reported sore on her buttocks, but had no orders for any pain medication. Patient endorsed SI, stating that she still has a plan to "take some pills", but does contract with this Clinical research associate. Patient also endorsed both depression and anxiety, stating that she has had issues with her ACT team and they were supposed to be helping her with housing, "I've been sleeping outside and eating out of the trash can, that makes me suicidal". Patient denied HI/AVH at this time. Patient had no stated goals for today.  A- Scheduled medications administered to patient, per MD orders. PRN Orders obtained from MD for pain. Support and encouragement provided. Routine safety checks conducted every 15 minutes. Patient informed to notify staff with problems or concerns.  R- No adverse drug reactions noted. Patient contracts for safety at this time. Patient compliant with medications and treatment plan. Patient receptive, calm, and cooperative. Patient isolates to room, reporting to the previous shift that she doesn't feel comfortable being around all of the male patients on the unit. Patient remains safe at this time.   01/03/23 0915  Psych Admission Type (Psych Patients Only)  Admission Status Involuntary  Psychosocial Assessment  Patient Complaints Sleep disturbance;Anxiety;Depression  Eye Contact Fair  Facial Expression Worried  Affect Anxious  Speech Soft  Interaction Assertive;Isolative (isolates to room; previous shift reported patient doesn't feel comfortable around the other patients)  Motor Activity Slow  Appearance/Hygiene Disheveled;Poor hygiene;In scrubs  Behavior Characteristics Cooperative;Appropriate to situation  Mood Depressed;Anxious;Pleasant  Aggressive  Behavior  Effect No apparent injury  Thought Process  Coherency Circumstantial  Content WDL  Delusions None reported or observed  Perception WDL  Hallucination None reported or observed  Judgment Impaired  Confusion None  Danger to Self  Current suicidal ideation? Passive  Self-Injurious Behavior Some self-injurious ideation observed or expressed.  No lethal plan expressed   Agreement Not to Harm Self Yes  Description of Agreement Verbal  Danger to Others  Danger to Others None reported or observed

## 2023-01-04 DIAGNOSIS — F332 Major depressive disorder, recurrent severe without psychotic features: Secondary | ICD-10-CM | POA: Diagnosis not present

## 2023-01-04 MED ORDER — MAGNESIUM CITRATE PO SOLN
1.0000 | Freq: Once | ORAL | Status: AC
  Administered 2023-01-04: 1 via ORAL
  Filled 2023-01-04: qty 296

## 2023-01-04 NOTE — Tx Team (Signed)
Interdisciplinary Treatment and Diagnostic Plan Update  01/04/2023 Time of Session: 9:00 AM  [name] MRN: [id number]  Principal Diagnosis: MDD (major depressive disorder)  Secondary Diagnoses: Principal Problem:   MDD (major depressive disorder) Active Problems:   Alcohol abuse   Cocaine dependence syndrome (HCC)   Current Medications:  Current Facility-Administered Medications  Medication Dose Route Frequency Provider Last Rate Last Admin   alum & mag hydroxide-simeth (MAALOX/MYLANTA) 200-200-20 MG/5ML suspension 30 mL  30 mL Oral Q4H PRN Motley-Mangrum, Jadeka A, PMHNP       amLODipine (NORVASC) tablet 5 mg  5 mg Oral Daily Motley-Mangrum, Jadeka A, PMHNP   5 mg at 01/04/23 0738   atorvastatin (LIPITOR) tablet 40 mg  40 mg Oral Daily Motley-Mangrum, Jadeka A, PMHNP   40 mg at 01/04/23 0738   busPIRone (BUSPAR) tablet 10 mg  10 mg Oral TID Motley-Mangrum, Jadeka A, PMHNP   10 mg at 01/04/23 1205   diphenhydrAMINE (BENADRYL) capsule 50 mg  50 mg Oral TID PRN Motley-Mangrum, Jadeka A, PMHNP   50 mg at 01/02/23 2312   Or   diphenhydrAMINE (BENADRYL) injection 50 mg  50 mg Intramuscular TID PRN Motley-Mangrum, Jadeka A, PMHNP       divalproex (DEPAKOTE) DR tablet 500 mg  500 mg Oral Q12H Motley-Mangrum, Jadeka A, PMHNP   500 mg at 01/04/23 0738   feeding supplement (ENSURE ENLIVE / ENSURE PLUS) liquid 237 mL  237 mL Oral BID BM Clapacs, John T, MD   237 mL at 01/04/23 4098   folic acid (FOLVITE) tablet 1 mg  1 mg Oral Daily Clapacs, John T, MD   1 mg at 01/04/23 1191   gabapentin (NEURONTIN) capsule 300 mg  300 mg Oral TID Motley-Mangrum, Jadeka A, PMHNP   300 mg at 01/04/23 1205   hydrOXYzine (ATARAX) tablet 50 mg  50 mg Oral TID PRN Clapacs, John T, MD   50 mg at 01/03/23 1754   ibuprofen (ADVIL) tablet 600 mg  600 mg Oral Q6H PRN Clapacs, Jackquline Denmark, MD       LORazepam (ATIVAN) tablet 1-4 mg  1-4 mg Oral Q1H PRN Clapacs, Jackquline Denmark, MD   1 mg at 01/04/23 1204   Or   LORazepam (ATIVAN)  injection 1-4 mg  1-4 mg Intravenous Q1H PRN Clapacs, Jackquline Denmark, MD       LORazepam (ATIVAN) tablet 2 mg  2 mg Oral TID PRN Motley-Mangrum, Jadeka A, PMHNP   2 mg at 01/02/23 2312   Or   LORazepam (ATIVAN) injection 2 mg  2 mg Intramuscular TID PRN Motley-Mangrum, Jadeka A, PMHNP       magnesium hydroxide (MILK OF MAGNESIA) suspension 30 mL  30 mL Oral Daily PRN Motley-Mangrum, Jadeka A, PMHNP       multivitamin with minerals tablet 1 tablet  1 tablet Oral Daily Clapacs, Jackquline Denmark, MD   1 tablet at 01/04/23 4782   polymixin-bacitracin (POLYSPORIN) ointment   Topical BID Clapacs, Jackquline Denmark, MD   Given at 01/04/23 0741   thiamine (VITAMIN B1) tablet 100 mg  100 mg Oral Daily Clapacs, Jackquline Denmark, MD   100 mg at 01/04/23 9562   Or   thiamine (VITAMIN B1) injection 100 mg  100 mg Intravenous Daily Clapacs, Jackquline Denmark, MD       traZODone (DESYREL) tablet 100 mg  100 mg Oral QHS PRN Clapacs, Jackquline Denmark, MD   100 mg at 01/03/23 2103   PTA Medications: Medications Prior to Admission  Medication  Sig Dispense Refill Last Dose   amLODipine (NORVASC) 5 MG tablet Take 1 tablet (5 mg total) by mouth daily. (Patient not taking: Reported on 01/03/2023) 60 tablet 1 Not Taking   atorvastatin (LIPITOR) 40 MG tablet Take 1 tablet (40 mg total) by mouth daily. (Patient not taking: Reported on 01/03/2023) 60 tablet 1 Not Taking   busPIRone (BUSPAR) 10 MG tablet Take 1 tablet (10 mg total) by mouth 3 (three) times daily. (Patient not taking: Reported on 01/03/2023) 90 tablet 0 Not Taking   divalproex (DEPAKOTE) 500 MG DR tablet Take 1 tablet (500 mg total) by mouth every 12 (twelve) hours. 60 tablet 0    folic acid (FOLVITE) 1 MG tablet Take 1 tablet (1 mg total) by mouth daily. (Patient not taking: Reported on 01/03/2023)   Not Taking   gabapentin (NEURONTIN) 300 MG capsule Take 1 capsule (300 mg total) by mouth every 8 (eight) hours. (Patient not taking: Reported on 01/03/2023) 90 capsule 1 Not Taking   thiamine (VITAMIN B-1) 100 MG tablet  Take 1 tablet (100 mg total) by mouth daily. (Patient not taking: Reported on 01/03/2023)   Not Taking   traZODone (DESYREL) 50 MG tablet Take 1 tablet (50 mg total) by mouth at bedtime. (Patient not taking: Reported on 01/03/2023) 30 tablet 0 Not Taking    Patient Stressors:    Patient Strengths:    Treatment Modalities: Medication Management, Group therapy, Case management,  1 to 1 session with clinician, Psychoeducation, Recreational therapy.   Physician Treatment Plan for Primary Diagnosis: MDD (major depressive disorder) Long Term Goal(s): Improvement in symptoms so as ready for discharge   Short Term Goals: Compliance with prescribed medications will improve Ability to verbalize feelings will improve Ability to disclose and discuss suicidal ideas  Medication Management: Evaluate patient's response, side effects, and tolerance of medication regimen.  Therapeutic Interventions: 1 to 1 sessions, Unit Group sessions and Medication administration.  Evaluation of Outcomes: Not Progressing  Physician Treatment Plan for Secondary Diagnosis: Principal Problem:   MDD (major depressive disorder) Active Problems:   Alcohol abuse   Cocaine dependence syndrome (HCC)  Long Term Goal(s): Improvement in symptoms so as ready for discharge   Short Term Goals: Compliance with prescribed medications will improve Ability to verbalize feelings will improve Ability to disclose and discuss suicidal ideas     Medication Management: Evaluate patient's response, side effects, and tolerance of medication regimen.  Therapeutic Interventions: 1 to 1 sessions, Unit Group sessions and Medication administration.  Evaluation of Outcomes: Not Progressing   RN Treatment Plan for Primary Diagnosis: MDD (major depressive disorder) Long Term Goal(s): Knowledge of disease and therapeutic regimen to maintain health will improve  Short Term Goals: Ability to remain free from injury will improve, Ability to  verbalize frustration and anger appropriately will improve, Ability to demonstrate self-control, Ability to participate in decision making will improve, Ability to verbalize feelings will improve, Ability to identify and develop effective coping behaviors will improve, and Compliance with prescribed medications will improve  Medication Management: RN will administer medications as ordered by provider, will assess and evaluate patient's response and provide education to patient for prescribed medication. RN will report any adverse and/or side effects to prescribing provider.  Therapeutic Interventions: 1 on 1 counseling sessions, Psychoeducation, Medication administration, Evaluate responses to treatment, Monitor vital signs and CBGs as ordered, Perform/monitor CIWA, COWS, AIMS and Fall Risk screenings as ordered, Perform wound care treatments as ordered.  Evaluation of Outcomes: Not Progressing   LCSW  Treatment Plan for Primary Diagnosis: MDD (major depressive disorder) Long Term Goal(s): Safe transition to appropriate next level of care at discharge, Engage patient in therapeutic group addressing interpersonal concerns.  Short Term Goals: Engage patient in aftercare planning with referrals and resources, Increase social support, Increase ability to appropriately verbalize feelings, Increase emotional regulation, Facilitate acceptance of mental health diagnosis and concerns, Identify triggers associated with mental health/substance abuse issues, and Increase skills for wellness and recovery  Therapeutic Interventions: Assess for all discharge needs, 1 to 1 time with Social worker, Explore available resources and support systems, Assess for adequacy in community support network, Educate family and significant other(s) on suicide prevention, Complete Psychosocial Assessment, Interpersonal group therapy.  Evaluation of Outcomes: Not Progressing   Progress in Treatment: Attending groups:  No. Participating in groups: No. Taking medication as prescribed: Yes. Toleration medication: Yes. Family/Significant other contact made: No, will contact:  if given permission Patient understands diagnosis: Yes. Discussing patient identified problems/goals with staff: Yes. Medical problems stabilized or resolved: Yes. Denies suicidal/homicidal ideation: No. Issues/concerns per patient self-inventory: No. Other: None  New problem(s) identified: No, Describe:  None  New Short Term/Long Term Goal(s): detox, elimination of symptoms of psychosis, medication management for mood stabilization; elimination of SI thoughts; development of comprehensive mental wellness/sobriety plan.  Patient Goals:  Pt expressed interest in housing   Discharge Plan or Barriers: CSW will assist pt with development of an appropriate discharge/aftercare plan  Reason for Continuation of Hospitalization: Depression Suicidal ideation Other; describe Anger  Estimated Length of Stay:  Last 3 [place] Suicide Severity Risk Score: Flowsheet Row Admission (Current) from 01/01/2023 in [facility] ED from 12/31/2022 in [facility] Emergency Department at [facility] from 12/11/2022 in [facility] Department at [facility]  C-SSRS RISK CATEGORY High Risk High Risk Error: Question 6 not populated       Last [facility] 2/9 Scores:    12/06/2022    2:43 PM 11/29/2022    2:41 PM 06/25/2022   11:51 AM  Depression screen PHQ 2/9  Decreased Interest 1 0 0  Down, Depressed, Hopeless 1 0 0  PHQ - 2 Score 2 0 0  Altered sleeping 1 1 0  Tired, decreased energy 1 0 0  Change in appetite 0 0 0  Feeling bad or failure about yourself  1 0 0  Trouble concentrating 1 0 0  Moving slowly or fidgety/restless 1 0 0  Suicidal thoughts 0 0 0  PHQ-9 Score 7 1 0  Difficult doing work/chores Very difficult Not difficult at all Not difficult at all    Scribe for Treatment Team: Elza Rafter, [facility] 01/04/2023 4:15 PM

## 2023-01-04 NOTE — Group Note (Signed)
Date:  01/04/2023 Time:  9:00AM  Group Topic/Focus:  Goals Group:   The focus of this group is to help patients establish daily goals to achieve during treatment and discuss how the patient can incorporate goal setting into their daily lives to aide in recovery.  Community Group   Participation Level:  Did Not Attend  Jacquel Redditt A Nekita Pita 01/04/2023, 11:37 AM

## 2023-01-04 NOTE — Plan of Care (Signed)
  Problem: Education: Goal: Knowledge of disease or condition will improve Outcome: Progressing Goal: Understanding of discharge needs will improve Outcome: Progressing   Problem: Physical Regulation: Goal: Complications related to the disease process, condition or treatment will be avoided or minimized Outcome: Progressing   Problem: Safety: Goal: Ability to remain free from injury will improve Outcome: Progressing   

## 2023-01-04 NOTE — [facility] Suicide Risk Assessment (Signed)
[facility] INPATIENT:  Family/Significant Other Suicide Prevention Education  Suicide Prevention Education:  Patient Refusal for Family/Significant Other Suicide Prevention Education: The patient [name] has refused to provide written consent for family/significant other to be provided Family/Significant Other Suicide Prevention Education during admission and/or prior to discharge.  Physician notified.  SPE completed with pt, as pt refused to consent to family contact. SPI pamphlet provided to pt and pt was encouraged to share information with support network, ask questions, and talk about any concerns relating to SPE. Pt denies access to guns/firearms and verbalized understanding of information provided. Mobile Crisis information also provided to pt.  Glenis Smoker 01/04/2023, 4:09 PM

## 2023-01-04 NOTE — Progress Notes (Signed)
   01/04/23 2200  Psych Admission Type (Psych Patients Only)  Admission Status Involuntary  Psychosocial Assessment  Patient Complaints Depression  Eye Contact Brief  Facial Expression Sad  Affect Sad  Speech Soft  Interaction Isolative;Minimal  Motor Activity Slow  Appearance/Hygiene Unremarkable;In scrubs  Behavior Characteristics Cooperative  Mood Depressed  Thought Process  Coherency WDL  Content WDL  Delusions None reported or observed  Perception Hallucinations  Hallucination Auditory  Judgment Impaired  Confusion None  Danger to Self  Current suicidal ideation? Passive  Self-Injurious Behavior Some self-injurious ideation observed or expressed.  No lethal plan expressed   Agreement Not to Harm Self Yes  Description of Agreement Verbal  Danger to Others  Danger to Others None reported or observed

## 2023-01-04 NOTE — Progress Notes (Signed)
D: Patient alert and oriented, able to make needs known. Endorses passive SI with a plan "to kill myself if I am discharged." Endorses AH/VH. Denies HI. Denies pain at present. Patient goal today "to find housing." She reports she slept "okay" last night. Patient does not request any PRN medication at this time. Patient endorses constipation today, she has not had a BM in 3 days. Dr. Toni Amend aware and Magnesium Citrate ordered.  A: Scheduled medications administered to patient per MD order. Support and encouragement provided. Routine safety checks conducted every fifteen minutes. Patient informed to notify staff with problems or concerns. Frequent verbal contact made.   R: No adverse drug reactions noted. Patient contracts for safety at this time. Patient is compliant with medications and treatment plan. Patient receptive, calm and cooperative. Patient interacts with others appropriately on unit at present. Patient remains safe at present.
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
## 2023-01-05 DIAGNOSIS — F332 Major depressive disorder, recurrent severe without psychotic features: Secondary | ICD-10-CM | POA: Diagnosis not present

## 2023-01-05 NOTE — Group Note (Signed)
Recreation Therapy Group Note   Group Topic:Relaxation  Group Date: 01/05/2023 Start Time: 1045 End Time: 1125 Facilitators: Rosina Lowenstein, LRT, CTRS Location:  Craft Room  Group Description: PMR (Progressive Muscle Relaxation). LRT asks patients their current level of stress/anxiety from 1-10, with 10 being the highest. LRT educates patients on what PMR is and the benefits that come from it. Patients are asked to sit with their feet flat on the floor while sitting up and all the way back in their chair, if possible. LRT and pts follow a prompt through a speaker that requires you to tense and release different muscles in their body and focus on their breathing. During session, lights are off and soft music is being played. At the end of the prompt, LRT asks patients to rank their current levels of stress/anxiety from 1-10, 10 being the highest.   Goal Area(s) Addressed:  Patients will be able to describe progressive muscle relaxation.  Patient will practice using relaxation technique. Patient will identify a new coping skill.  Patient will follow multistep directions to reduce anxiety and stress.  Affect/Mood: N/A   Participation Level: Did not attend    Clinical Observations/Individualized Feedback: [name] did not attend group due to resting in their room.  Plan: Continue to engage patient in RT group sessions 2-3x/week.   Rosina Lowenstein, LRT, CTRS 01/05/2023 11:36 AM

## 2023-01-05 NOTE — Plan of Care (Signed)
D- Patient alert and oriented.Pt is irritable and seems annoyed when offered medication at scheduled times, stated she just wants to sleep. Denies SI, HI, AVH, and pain. Pt isolated in room. A- Scheduled medications administered to patient, per MD orders. Support and encouragement provided.  Routine safety checks conducted every 15 minutes.  Patient informed to notify staff with problems or concerns. R- No adverse drug reactions noted. Patient contracts for safety at this time. Patient compliant with medications and treatment plan. Patient receptive, calm, and cooperative. Patient interacts well with others on the unit.  Patient remains safe at this time.
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
## 2023-01-05 NOTE — Progress Notes (Signed)
Pt remains irritable and isolative,has stayed in bed much of shift. Stated that they had poor sleep last night.

## 2023-01-06 DIAGNOSIS — F332 Major depressive disorder, recurrent severe without psychotic features: Secondary | ICD-10-CM | POA: Diagnosis not present

## 2023-01-06 NOTE — Group Note (Signed)
Date:  01/06/2023 Time:  10:49 AM  Group Topic/Focus:  Activity Group  We encourage the patients to go outside so they can get some fresh air which can assist for their well-being mentally and physically.    Participation Level:  Did Not Attend   [name] 01/06/2023, 10:49 AM  

## 2023-01-06 NOTE — Plan of Care (Signed)
D- Patient alert and oriented. Pt remains isolating in room much of day.  Pt did eat breakfast in common area, this is improved from yesterday.This writer had to remind pt when to take medication Denies SI, HI, AVH, and pain.   A- Scheduled medications administered to patient, per MD orders. Support and encouragement provided.  Routine safety checks conducted every 15 minutes.  Patient informed to notify staff with problems or concerns.  R- No adverse drug reactions noted. Patient contracts for safety at this time. Patient compliant with medications and treatment plan. Patient receptive, calm, and cooperative. Patient interacts well with others on the unit.  Patient remains safe at this time.

## 2023-01-06 NOTE — Group Note (Signed)
Recreation Therapy Group Note   Group Topic:Emotion Expression  Group Date: 01/06/2023 Start Time: 1000 End Time: 1035 Facilitators: Rosina Lowenstein, LRT, CTRS Location:  Craft Room  Group Description: Gratitude Journaling. Patients and LRT discussed what gratitude means, how we can express it and what it means to [place], personally. LRT gave an education handout on the definition of gratitude that also gave different examples of gratitude exercises that they could try. One of the examples was "Gratitude Letter", which prompted you to write a letter to someone you appreciate. LRT played soft music while everyone wrote their letter. Once letter was completed, LRT encouraged people to read their letter, if they wanted to, or share who they wrote it to, at minimum. LRT and pts processed how showing gratitude towards themselves, and others can be applied to everyday life post-discharge.   Goal Area(s) Addressed:  Patient will identify the definition of gratitude. Patient will learn different gratitude exercises. Patient will practice writing/journaling as a coping skill.   Affect/Mood: N/A   Participation Level: Did not attend    Clinical Observations/Individualized Feedback: Patient did not attend due to resting in their room.  Plan: Continue to engage patient in RT group sessions 2-3x/week.   Rosina Lowenstein, LRT, CTRS 01/06/2023 11:12 AM
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
## 2023-01-06 NOTE — Progress Notes (Signed)
Patient calm and pleasant during assessment denies SI/HI/AVH. Pt more active on the unit than when this writer had her previously this admission. Pt compliant with medication administration per MD orders. Pt given education, support, and encouragement to be active in her treatment plan. Pt being monitored Q 15 minutes for safety per unit protocol, remains safe on the unit

## 2023-01-06 NOTE — Progress Notes (Signed)
Pt is irritable today and stated that we have messed up their medication schedule. Pt understands that they can change  medication schedule when discharged. Pt stated they feel better,but continues to isolate to their room.

## 2023-01-06 NOTE — Group Note (Signed)
Date:  01/06/2023 Time:  9:49 PM  Group Topic/Focus:  Wrap-Up Group:   The focus of this group is to help patients review their daily goal of treatment and discuss progress on daily workbooks.    Participation Level:  Active  Participation Quality:  Appropriate  Affect:  Appropriate  Cognitive:  Appropriate  Insight: Good  Engagement in Group:  Engaged  Modes of Intervention:  Education  Additional Comments:    Lenore Cordia 01/06/2023, 9:49 PM

## 2023-01-06 NOTE — Group Note (Signed)
Date:  01/06/2023 Time:  7:38 AM  Group Topic/Focus:  Music therapy. Outside/Courtyard     Participation Level:  Did Not Attend   Drakkar Medeiros L Aaditya Letizia 01/06/2023, 7:38 AM  

## 2023-01-06 NOTE — Group Note (Signed)
[facility] LCSW Group Therapy Note   Group Date: 01/06/2023 Start Time: 1315 End Time: 1400   Type of Therapy/Topic:  Group Therapy:  Balance in Life  Participation Level:  Did Not Attend   Description of Group:    This group will address the concept of balance and how it feels and looks when one is unbalanced. Patients will be encouraged to process areas in their lives that are out of balance, and identify reasons for remaining unbalanced. Facilitators will guide patients utilizing problem- solving interventions to address and correct the stressor making their life unbalanced. Understanding and applying boundaries will be explored and addressed for obtaining  and maintaining a balanced life. Patients will be encouraged to explore ways to assertively make their unbalanced needs known to significant others in their lives, using other group members and facilitator for support and feedback.  Therapeutic Goals: Patient will identify two or more emotions or situations they have that consume much of in their lives. Patient will identify signs/triggers that life has become out of balance:  Patient will identify two ways to set boundaries in order to achieve balance in their lives:  Patient will demonstrate ability to communicate their needs through discussion and/or role plays  Summary of Patient Progress:    X    Therapeutic Modalities:   Cognitive Behavioral Therapy Solution-Focused Therapy Assertiveness Training   Jennetta Flood D Alekxander Isola, LCSWA 

## 2023-01-07 ENCOUNTER — Other Ambulatory Visit: Payer: Self-pay

## 2023-01-07 DIAGNOSIS — F332 Major depressive disorder, recurrent severe without psychotic features: Secondary | ICD-10-CM | POA: Diagnosis not present

## 2023-01-07 MED ORDER — DOUBLE ANTIBIOTIC 500-10000 UNIT/GM EX OINT: 1.0000 | TOPICAL_OINTMENT | Freq: Two times a day (BID) | CUTANEOUS | 1 refills | Status: DC

## 2023-01-07 MED ORDER — BUSPIRONE HCL 10 MG PO TABS
10.0000 mg | ORAL_TABLET | Freq: Three times a day (TID) | ORAL | 1 refills | Status: DC
Start: 2023-01-07 — End: 2023-01-07

## 2023-01-07 MED ORDER — GABAPENTIN 300 MG PO CAPS
300.0000 mg | ORAL_CAPSULE | Freq: Three times a day (TID) | ORAL | 0 refills | Status: DC
  Filled 2023-01-07: qty 90, 30d supply, fill #0

## 2023-01-07 MED ORDER — VITAMIN B-1 100 MG PO TABS
100.0000 mg | ORAL_TABLET | Freq: Every day | ORAL | 0 refills | Status: DC
  Filled 2023-01-07: qty 30, 30d supply, fill #0

## 2023-01-07 MED ORDER — AMLODIPINE BESYLATE 5 MG PO TABS
5.0000 mg | ORAL_TABLET | Freq: Every day | ORAL | 0 refills | Status: DC
Start: 2023-01-07 — End: 2023-01-18
  Filled 2023-01-07: qty 30, 30d supply, fill #0

## 2023-01-07 MED ORDER — DIVALPROEX SODIUM 500 MG PO DR TAB: 500.0000 mg | DELAYED_RELEASE_TABLET | Freq: Two times a day (BID) | ORAL | 0 refills | Status: DC

## 2023-01-07 MED ORDER — TRAZODONE HCL 100 MG PO TABS: 100.0000 mg | ORAL_TABLET | Freq: Every evening | ORAL | 1 refills | Status: DC | PRN

## 2023-01-07 MED ORDER — GABAPENTIN 300 MG PO CAPS: 300.0000 mg | ORAL_CAPSULE | Freq: Three times a day (TID) | ORAL | 1 refills | Status: DC

## 2023-01-07 MED ORDER — DIVALPROEX SODIUM 500 MG PO DR TAB: 500.0000 mg | DELAYED_RELEASE_TABLET | Freq: Two times a day (BID) | ORAL | 1 refills | Status: DC

## 2023-01-07 MED ORDER — ATORVASTATIN CALCIUM 40 MG PO TABS: 40.0000 mg | ORAL_TABLET | Freq: Every day | ORAL | 1 refills | Status: DC

## 2023-01-07 MED ORDER — ATORVASTATIN CALCIUM 40 MG PO TABS
40.0000 mg | ORAL_TABLET | Freq: Every day | ORAL | 0 refills | Status: DC
  Filled 2023-01-07: qty 30, 30d supply, fill #0

## 2023-01-07 MED ORDER — DIVALPROEX SODIUM 500 MG PO DR TAB
500.0000 mg | DELAYED_RELEASE_TABLET | Freq: Two times a day (BID) | ORAL | 0 refills | Status: DC
  Filled 2023-01-07: qty 60, 30d supply, fill #0

## 2023-01-07 MED ORDER — TRAZODONE HCL 100 MG PO TABS
100.0000 mg | ORAL_TABLET | Freq: Every evening | ORAL | 0 refills | Status: DC | PRN
  Filled 2023-01-07: qty 30, 30d supply, fill #0

## 2023-01-07 MED ORDER — TRIPLE ANTIBIOTIC 3.5-400-5000 EX OINT
1.0000 | TOPICAL_OINTMENT | Freq: Two times a day (BID) | CUTANEOUS | 0 refills | Status: DC
  Filled 2023-01-07: qty 28, 14d supply, fill #0
  Filled 2023-01-07: qty 28.4, 14d supply, fill #0

## 2023-01-07 MED ORDER — VITAMIN B-1 100 MG PO TABS: 100.0000 mg | ORAL_TABLET | Freq: Every day | ORAL | 1 refills | Status: DC

## 2023-01-07 MED ORDER — AMLODIPINE BESYLATE 5 MG PO TABS
5.0000 mg | ORAL_TABLET | Freq: Every day | ORAL | 1 refills | Status: DC
Start: 2023-01-07 — End: 2023-01-07

## 2023-01-07 MED ORDER — BUSPIRONE HCL 10 MG PO TABS
10.0000 mg | ORAL_TABLET | Freq: Three times a day (TID) | ORAL | 0 refills | Status: DC
Start: 2023-01-07 — End: 2023-08-12
  Filled 2023-01-07: qty 90, 30d supply, fill #0

## 2023-01-07 NOTE — [facility] Suicide Risk Assessment (Signed)
[facility] Discharge Suicide Risk Assessment   Principal Problem: MDD (major depressive disorder) Discharge Diagnoses: Principal Problem:   MDD (major depressive disorder) Active Problems:   Alcohol abuse   Cocaine dependence syndrome (HCC)   Total Time spent with patient: 30 minutes  Musculoskeletal: Strength & Muscle Tone: within normal limits Gait & Station: normal Patient leans: N/A  Psychiatric Specialty Exam  Presentation  General Appearance:  Appropriate for Environment  Eye Contact: Good  Speech: Clear and Coherent  Speech Volume: Normal  Handedness: Right   Mood and Affect  Mood: Depressed  Duration of Depression Symptoms: Greater than two weeks  Affect: Depressed; Flat   Thought Process  Thought Processes: Coherent  Descriptions of Associations:Intact  Orientation:Full (Time, Place and Person)  Thought Content:WDL  History of Schizophrenia/Schizoaffective disorder:No  Duration of Psychotic Symptoms:N/A  Hallucinations:No data recorded Ideas of Reference:None  Suicidal Thoughts:No data recorded Homicidal Thoughts:No data recorded  Sensorium  Memory: Immediate Good; Recent Good; Remote Fair  Judgment: Fair  Insight: Fair   Art therapist  Concentration: Fair  Attention Span: Fair  Recall: [employer] of Knowledge: Fair  Language: Fair   Psychomotor Activity  Psychomotor Activity:No data recorded  Assets  Assets: Communication Skills; Desire for Improvement; Social Support   Sleep  Sleep:No data recorded  Physical Exam: Physical Exam Vitals reviewed.  Constitutional:      Appearance: Normal appearance.  HENT:     Head: Normocephalic and atraumatic.     Mouth/Throat:     Pharynx: Oropharynx is clear.  Eyes:     Pupils: Pupils are equal, round, and reactive to light.  Cardiovascular:     Rate and Rhythm: Normal rate and regular rhythm.  Pulmonary:     Effort: Pulmonary effort is normal.      Breath sounds: Normal breath sounds.  Abdominal:     General: Abdomen is flat.     Palpations: Abdomen is soft.  Musculoskeletal:        General: Normal range of motion.  Skin:    General: Skin is warm and dry.  Neurological:     General: No focal deficit present.     Mental Status: She is alert. Mental status is at baseline.  Psychiatric:        Attention and Perception: Attention normal.        Mood and Affect: Mood normal.        Speech: Speech normal.        Behavior: Behavior normal.        Thought Content: Thought content normal.        Cognition and Memory: Cognition normal.    Review of Systems  Constitutional: Negative.   HENT: Negative.    Eyes: Negative.   Respiratory: Negative.    Cardiovascular: Negative.   Gastrointestinal: Negative.   Musculoskeletal: Negative.   Skin: Negative.   Neurological: Negative.   Psychiatric/Behavioral: Negative.     Blood pressure 126/82, pulse (!) 105, temperature 98.4 F (36.9 C), temperature source Oral, resp. rate 19, height 5\' 6"  (1.676 m), weight 71.2 kg, SpO2 97 %. Body mass index is 25.34 kg/m.  Mental Status Per Nursing Assessment::   On Admission:  Suicidal ideation indicated by patient, Self-harm thoughts, Self-harm behaviors  Demographic Factors:  Low socioeconomic status  Loss Factors: Financial problems/change in socioeconomic status  Historical Factors: Prior suicide attempts and Impulsivity  Risk Reduction Factors:   Positive therapeutic relationship  Continued Clinical Symptoms:  Depression:   Comorbid alcohol abuse/dependence Alcohol/Substance Abuse/Dependencies  Cognitive Features That Contribute To Risk:  None    Suicide Risk:  Minimal: No identifiable suicidal ideation.  Patients presenting with no risk factors but with morbid ruminations; may be classified as minimal risk based on the severity of the depressive symptoms    Plan Of Care/Follow-up recommendations:  Other:  Patient being  discharged today with follow-up recommended in local community.  Patient reports her mood is very good.  Denies physical complaints denies mental health complaints.  Denies suicidal thought.  Plan is for discharge and recommend follow-up with local mental health providers  Mordecai Rasmussen, MD 01/07/2023, 9:27 AM

## 2023-01-07 NOTE — [facility] Counselor (Signed)
Pt received shelter and boarding house listing yesterday.   CSW met with pt briefly regarding discharge/aftercare plans. She shared that her mother would be coming to get her and that they would talk about where she could stay afterwards. Pt asked that CSW contact the [employer] to inquire regarding housing voucher because this is the program that she had mentioned to the CSW. CSW agreed to attempt contact. Pt had clothes to wear home upon discharge. She denied use of tobacco products or need for cessation services. However, pt is noted to be a smoker. She endorsed use of alcohol use but denied any substance use specific services. No other concerns expressed. Contact ended without incident.  CSW made referral for appointment through online secure form through [place]. Awaiting appointment date and time.   CSW attempted contact with [employer] [phone]). Contact was not able to be established but HIPAA compliant voicemail left with contact information for follow up.   Vilma Meckel. Algis Greenhouse, MSW, LCSW, LCAS 01/07/2023 10:20 AM

## 2023-01-07 NOTE — Group Note (Signed)
[facility] LCSW Group Therapy Note   Group Date: 01/07/2023 Start Time: 1315 End Time: 1415  Type of Therapy and Topic:  Group Therapy:  Feelings around Relapse and Recovery  Participation Level:  Did Not Attend   Description of Group:    Patients in this group will discuss emotions they experience before and after a relapse. They will process how experiencing these feelings, or avoidance of experiencing them, relates to having a relapse. Facilitator will guide patients to explore emotions they have related to recovery. Patients will be encouraged to process which emotions are more powerful. They will be guided to discuss the emotional reaction significant others in their lives may have to patients' relapse or recovery. Patients will be assisted in exploring ways to respond to the emotions of others without this contributing to a relapse.  Therapeutic Goals: Patient will identify two or more emotions that lead to relapse for them:  Patient will identify two emotions that result when they relapse:  Patient will identify two emotions related to recovery:  Patient will demonstrate ability to communicate their needs through discussion and/or role plays.   Summary of Patient Progress: X   Therapeutic Modalities:   Cognitive Behavioral Therapy Solution-Focused Therapy Assertiveness Training Relapse Prevention Therapy   Johann Santone R July Linam, LCSW 

## 2023-01-07 NOTE — Group Note (Signed)
Recreation Therapy Group Note   Group Topic:Leisure Education  Group Date: 01/07/2023 Start Time: 1000 End Time: 1100 Facilitators: Rosina Lowenstein, LRT, CTRS Location:  Craft Room  Group Description: Leisure. Patients were given the option to choose from coloring mandalas, singing karaoke, playing cards, or making origami. LRT and pts discussed the meaning of leisure, the importance of participating in leisure during their free time/when they're outside of the hospital, as well as how our leisure interests can also serve as coping skills. Pt identified two leisure interests and shared with the group.   Goal Area(s) Addressed:  Patient will identify a current leisure interest.  Patient will learn the definition of "leisure". Patient will practice making a positive decision. Patient will have the opportunity to try a new leisure activity. Patient will communicate with peers and LRT.   Affect/Mood: N/A   Participation Level: Did not attend    Clinical Observations/Individualized Feedback: [name] did not attend group due to resting in her room.  Plan: Continue to engage patient in RT group sessions 2-3x/week.   Rosina Lowenstein, LRT, CTRS 01/07/2023 11:18 AM

## 2023-01-07 NOTE — Progress Notes (Signed)
D: Pt A & O X 3. Denies SI, HI, AVH and pain at this time. D/C home as ordered. Picked up in medical mall by her legal guardian, mother.  A: D/C instructions reviewed with pt including prescriptions and follow up appointment with [facility] on 01/10/23 at 0830; compliance encouraged. All belongings from locker 19 returned to pt at time of departure. Scheduled medications administered with verbal education and effects monitored. Safety checks maintained without incident till time of d/c.  R: Pt receptive to care. Compliant with medications when offered. Denies adverse drug reactions when assessed. Verbalized understanding related to d/c instructions. Pt is in agreement with items received from locker. Ambulatory with a steady gait. Appears to be in no physical distress at time of departure.
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
## 2023-01-10 ENCOUNTER — Other Ambulatory Visit: Payer: Self-pay

## 2023-01-10 ENCOUNTER — Telehealth: Payer: Self-pay

## 2023-01-10 NOTE — Transitions of Care (Post Inpatient/ED Visit) (Unsigned)
   01/10/2023  Name: [name] MRN: [id number] DOB: Nov 22, 1980  Today's TOC FU Call Status: Today's TOC FU Call Status:: Unsuccessul Call (1st Attempt) Unsuccessful Call (1st Attempt) Date: 01/10/23  Attempted to reach the patient regarding the most recent Inpatient/ED visit.  Follow Up Plan: Additional outreach attempts will be made to reach the patient to complete the Transitions of Care (Post Inpatient/ED visit) call.   Signature Karena Addison, LPN [facility] Nurse Health Advisor Direct Dial [phone]

## 2023-01-11 ENCOUNTER — Ambulatory Visit (HOSPITAL_COMMUNITY): Payer: Medicaid Other | Admitting: Licensed Clinical Social Worker

## 2023-01-11 ENCOUNTER — Telehealth (INDEPENDENT_AMBULATORY_CARE_PROVIDER_SITE_OTHER): Payer: Medicaid Other | Admitting: Licensed Clinical Social Worker

## 2023-01-11 NOTE — Transitions of Care (Post Inpatient/ED Visit) (Signed)
   01/11/2023  Name: [name] MRN: [id number] DOB: [birth year]  Today's TOC FU Call Status: Today's TOC FU Call Status:: Successful TOC FU Call Competed Unsuccessful Call (1st Attempt) Date: 01/10/23 [facility] FU Call Complete Date: 01/11/23  Transition Care Management Follow-up Telephone Call Date of Discharge: 01/07/23 Discharge Facility: [facility]) Type of Discharge: Inpatient Admission Primary Inpatient Discharge Diagnosis:: hypertension How have you been since you were released from the hospital?: Same Any questions or concerns?: No  Items Reviewed: Did you receive and understand the discharge instructions provided?: No Medications obtained,verified, and reconciled?: Yes (Medications Reviewed) Any new allergies since your discharge?: No Dietary orders reviewed?: Yes Do you have support at home?: No  Medications Reviewed Today: Medications Reviewed Today     Reviewed by Karena Addison, LPN (Licensed Practical Nurse) on 01/11/23 at 1612  Med List Status: <None>   Medication Order Taking? Sig Documenting Provider Last Dose Status Informant  amLODipine (NORVASC) 5 MG tablet [id number]  Take 1 tablet (5 mg total) by mouth daily. Clapacs, Jackquline Denmark, MD  Active   atorvastatin (LIPITOR) 40 MG tablet [id number]  Take 1 tablet (40 mg total) by mouth daily. Clapacs, Jackquline Denmark, MD  Active   busPIRone (BUSPAR) 10 MG tablet [id number]  Take 1 tablet (10 mg total) by mouth 3 (three) times daily. Clapacs, Jackquline Denmark, MD  Active   divalproex (DEPAKOTE) 500 MG DR tablet [id number]  Take 1 tablet (500 mg total) by mouth every 12 (twelve) hours. Clapacs, Jackquline Denmark, MD  Active   gabapentin (NEURONTIN) 300 MG capsule [id number]  Take 1 capsule (300 mg total) by mouth 3 (three) times daily. Clapacs, Jackquline Denmark, MD  Active   neomycin-bacitracin-polymyxin [phone] OINT [id number]  Apply 1 Application topically 2 (two) times daily. Clapacs, Jackquline Denmark, MD  Active   thiamine (VITAMIN B-1) 100 MG  tablet [id number]  Take 1 tablet (100 mg total) by mouth daily. Clapacs, Jackquline Denmark, MD  Active   traZODone (DESYREL) 100 MG tablet [id number]  Take 1 tablet (100 mg total) by mouth at bedtime as needed for sleep. Clapacs, Jackquline Denmark, MD  Active             Home Care and Equipment/Supplies: Were Home Health Services Ordered?: NA Any new equipment or medical supplies ordered?: NA  Functional Questionnaire: Do you need assistance with bathing/showering or dressing?: No Do you need assistance with meal preparation?: No Do you need assistance with eating?: No Do you have difficulty maintaining continence: No Do you need assistance with getting out of bed/getting out of a chair/moving?: No Do you have difficulty managing or taking your medications?: No  Follow up appointments reviewed: PCP Follow-up appointment confirmed?: Yes Date of PCP follow-up appointment?: 01/18/23 Follow-up Provider: [facility] Follow-up appointment confirmed?: NA Do you need transportation to your follow-up appointment?: No Do you understand care options if your condition(s) worsen?: Yes-patient verbalized understanding    SIGNATURE Karena Addison, LPN [facility] Nurse Health Advisor Direct Dial [phone]

## 2023-01-11 NOTE — Telephone Encounter (Signed)
[name], who prefers to go by "[name]" was scheduled to attend an in-person therapy appointment today. Clinician outreached [name] at 2:09pm by phone when she had not presented for session on time. [name] did not answer, and a voicemail could not be left. Clinician informed front desk staff of no-show event when [name] did not present or outreach office regarding tardiness by 2:15pm.  This is [name]'s second consecutive no-show event.     Noralee Stain, [place], LCAS 01/11/23

## 2023-01-16 IMAGING — CR DG CERVICAL SPINE COMPLETE 4+V
5 series · 5 of 5 positions shown · non-contrast
Comparison: 08/17/2017

CLINICAL DATA: Left shoulder pain

EXAM:
CERVICAL SPINE - COMPLETE 4+ VIEW

[c-spine lat]
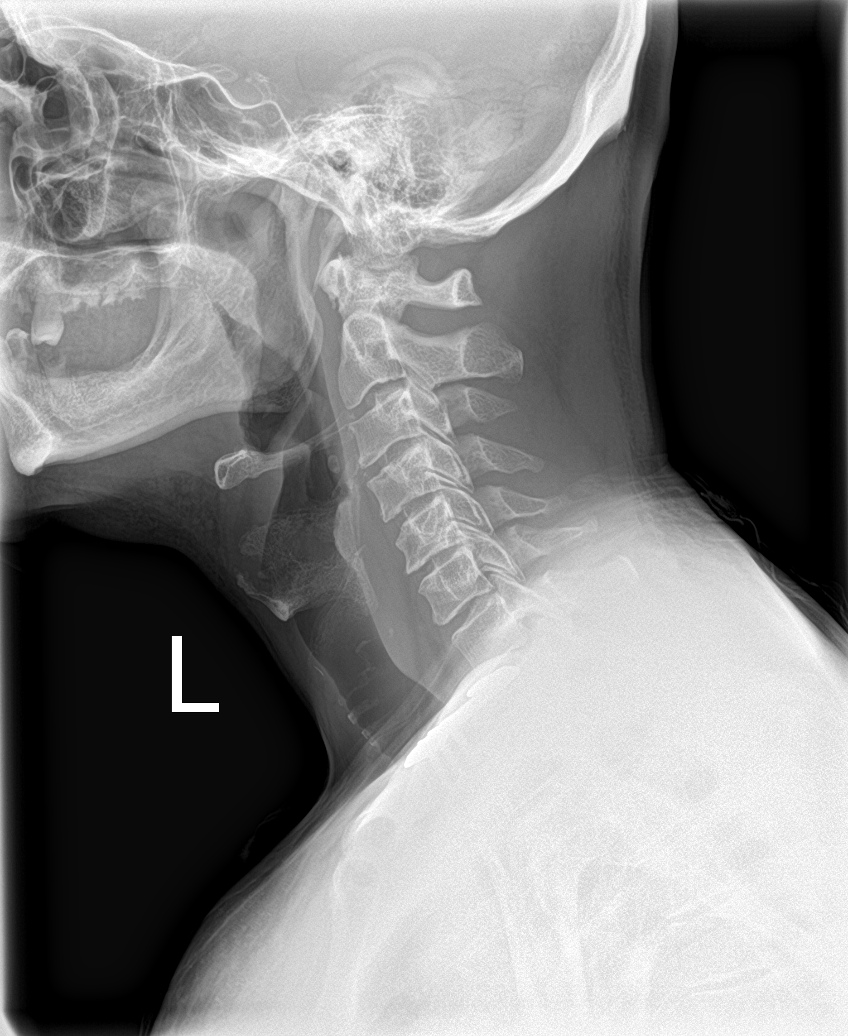

[c-spine obl (1 of 2)]
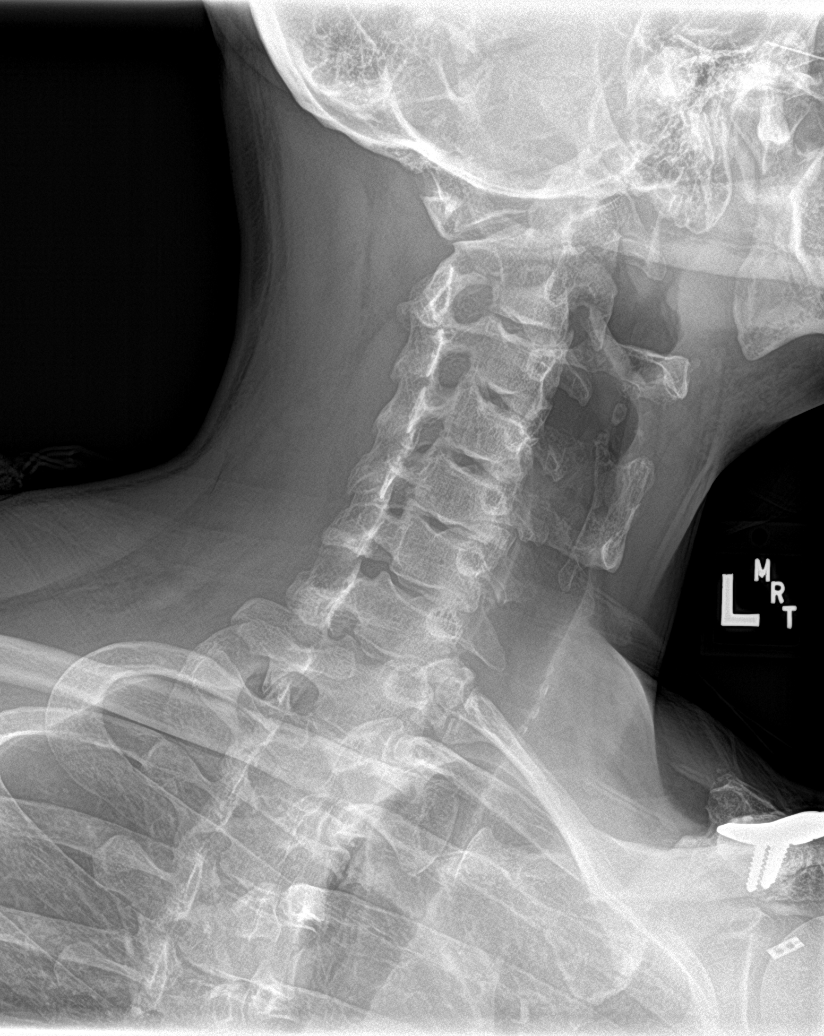

[c-spine obl (2 of 2)]
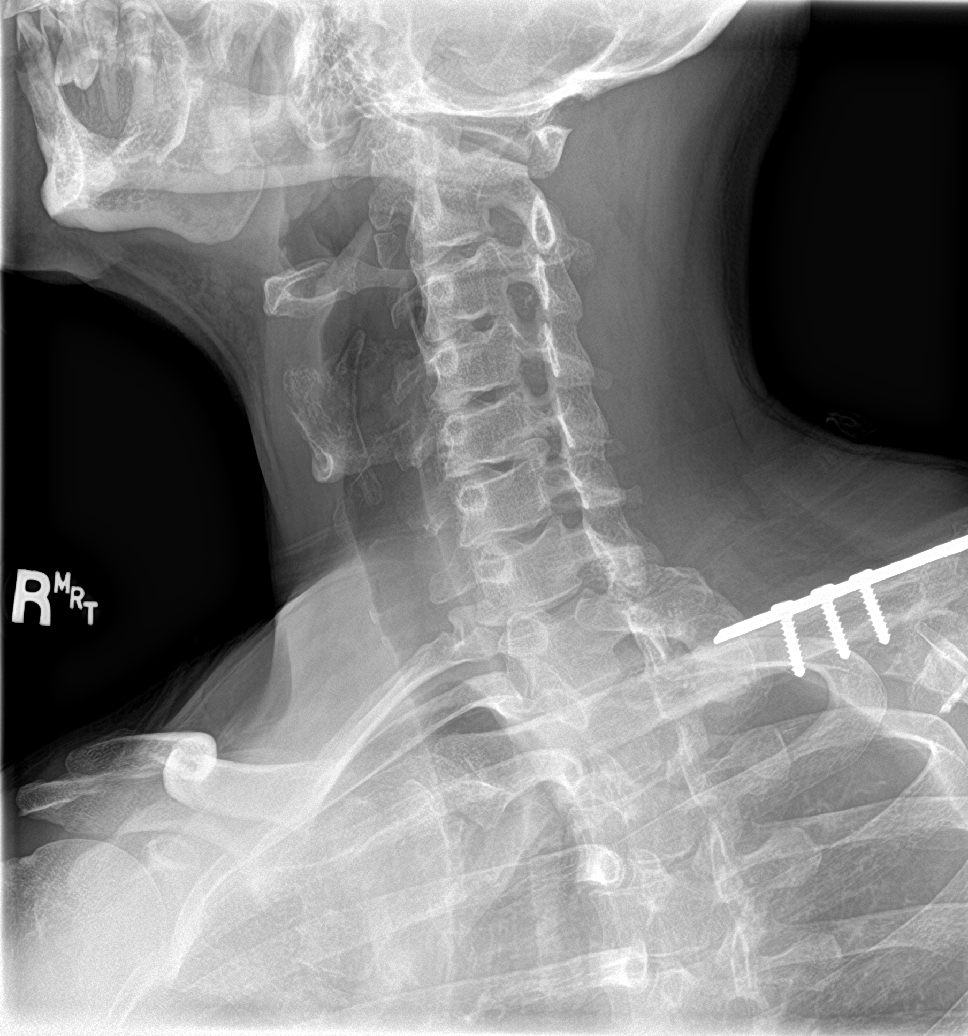

[c-spine ap]
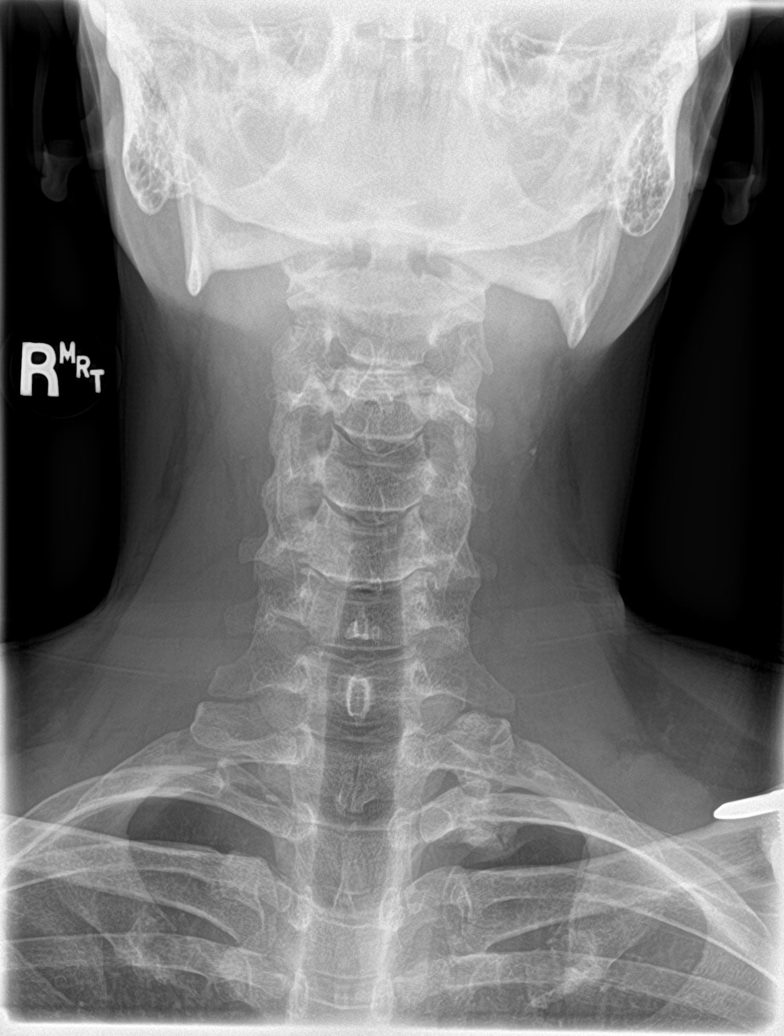

[c-spine open mouth]
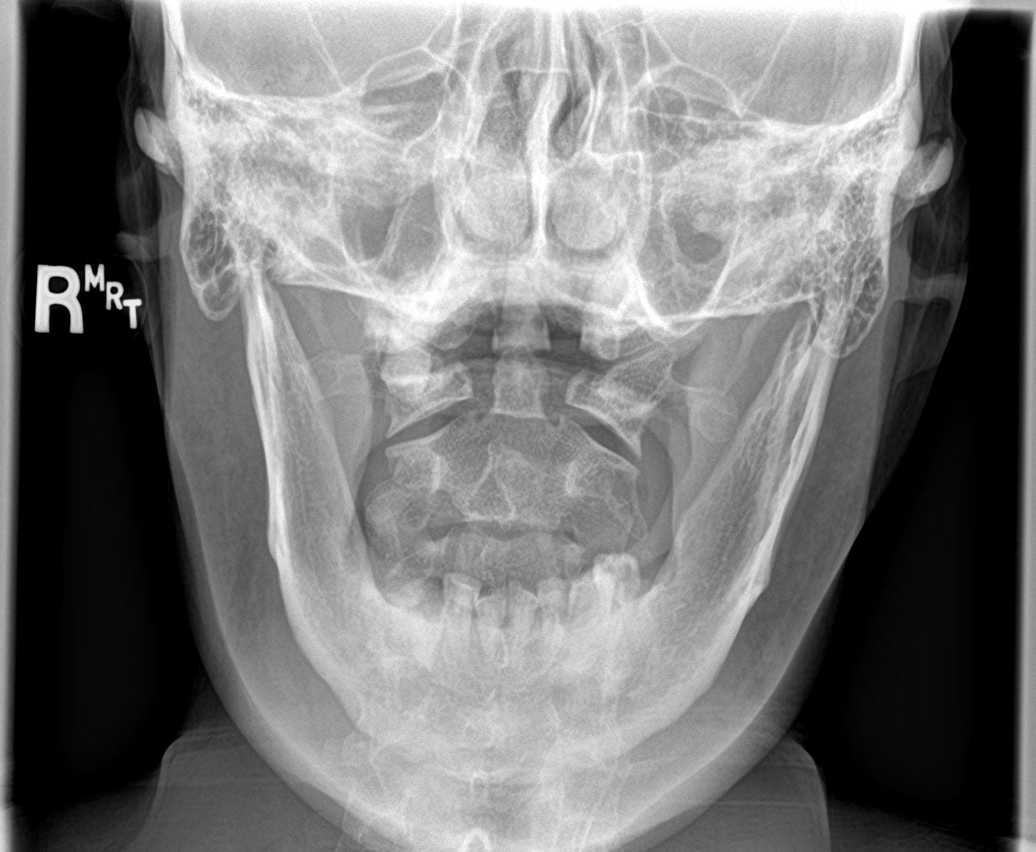

[5 of 5 positions shown; findings below may reference images not displayed]

FINDINGS: Disc spaces are maintained. Mild right degenerative facet disease.
Early uncovertebral spurring causes mild bilateral neural foraminal
narrowing at C5-6, left slightly greater than right. No fracture.
Prevertebral soft tissues are normal. Normal alignment.
IMPRESSION: Early degenerative changes. Bilateral neural foraminal narrowing at
C5-6, left greater than right.

## 2023-01-18 ENCOUNTER — Encounter: Payer: Self-pay | Admitting: Student

## 2023-01-18 ENCOUNTER — Ambulatory Visit: Payer: MEDICAID | Admitting: Student

## 2023-01-18 VITALS — BP 118/76 | HR 110 | Temp 98.1°F | Wt 164.3 lb

## 2023-01-18 DIAGNOSIS — E785 Hyperlipidemia, unspecified: Secondary | ICD-10-CM

## 2023-01-18 DIAGNOSIS — F339 Major depressive disorder, recurrent, unspecified: Secondary | ICD-10-CM

## 2023-01-18 DIAGNOSIS — M25512 Pain in left shoulder: Secondary | ICD-10-CM

## 2023-01-18 DIAGNOSIS — I10 Essential (primary) hypertension: Secondary | ICD-10-CM | POA: Diagnosis not present

## 2023-01-18 DIAGNOSIS — G8929 Other chronic pain: Secondary | ICD-10-CM

## 2023-01-18 DIAGNOSIS — L299 Pruritus, unspecified: Secondary | ICD-10-CM

## 2023-01-18 DIAGNOSIS — F332 Major depressive disorder, recurrent severe without psychotic features: Secondary | ICD-10-CM

## 2023-01-18 MED ORDER — ATORVASTATIN CALCIUM 40 MG PO TABS: 40.0000 mg | ORAL_TABLET | Freq: Every day | ORAL | 11 refills | Status: DC

## 2023-01-18 MED ORDER — AMLODIPINE BESYLATE 5 MG PO TABS
5.0000 mg | ORAL_TABLET | Freq: Every day | ORAL | 11 refills | Status: DC
Start: 2023-01-18 — End: 2024-02-14

## 2023-01-18 MED ORDER — DICLOFENAC SODIUM 1 % EX GEL: 2.0000 g | Freq: Four times a day (QID) | CUTANEOUS | 2 refills | Status: DC

## 2023-01-18 NOTE — Assessment & Plan Note (Signed)
Patient has a long standing history of hypertension currently being treated with amlodipine 5 mg.  Patient denies any headache excessive shortness of breath on exertion and has no complaints at this time.  [name] blood pressure in the office today is 118/76. Plan - Continue on current regimen of amlodipine 5 mg

## 2023-01-18 NOTE — Assessment & Plan Note (Addendum)
Patient has a history of major depressive disorder MDD.  She is currently being seen by an ACT team and is being managed by Depakote 500 mg buspirone 10 mg.  Patient endorses adherence to current regimen.  Patient has normal mood and affect in the office today. Plan - Current regimen of Depakote 500 mg and buspirone 10 mg

## 2023-01-18 NOTE — Assessment & Plan Note (Signed)
Patient has a history of hyperlipidemia currently being managed with atorvastatin 40 mg.  [name] last LDL was 268.  Patient reported running out of his cholesterol medication for the past couple of weeks. Plan : - Refill atorvastatin 40 mg today - Counsel patient on medication adherence and side effect profile of hyperlipidemia if not well-controlled - Follow-up in 2 months for routine blood work to further reassess the LDL level.
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
## 2023-01-18 NOTE — Assessment & Plan Note (Addendum)
Patient reported chronic itching all over his body that has been going on for quite some time now.  Patient said nothing makes it worse nothing makes it better.  Skin examination is negative for rash.  Consider conservative management at this time patient is recommended to try Sarna lotion.  [name] itching will be further assessed and addressed at the next visit.

## 2023-01-18 NOTE — Patient Instructions (Addendum)
Thank you, [name] for allowing [place] to provide your care today. Today we discussed your mental health medication, hypertension and high cholesterol.  We also discussed your left shoulder pain that doesn't get better with ibuprofen and your itching as well.   - I will prescribe your Amlodipine 5mg  for your hypertension - I will also prescribe your Atorvastatin 40 mg for your cholesterol - Continue to follow up with your pain clinic - Continue to work with the ACT team as needed  - I have sent in Diclofenac gel for your shoulder pain  - Please use sarna ( anti-itch) lotion . - Please follow up with [place] in 2 months for blood work to access your cholesterol   I have ordered the following labs for you:  Lab Orders  No laboratory test(s) ordered today    Referrals ordered today:   Referral Orders  No referral(s) requested today     I have ordered the following medication/changed the following medications:   Stop the following medications: Medications Discontinued During This Encounter  Medication Reason   amLODipine (NORVASC) 5 MG tablet Reorder   atorvastatin (LIPITOR) 40 MG tablet Reorder     Start the following medications: Meds ordered this encounter  Medications   amLODipine (NORVASC) 5 MG tablet    Sig: Take 1 tablet (5 mg total) by mouth daily.    Dispense:  30 tablet    Refill:  11    Discharge today   atorvastatin (LIPITOR) 40 MG tablet    Sig: Take 1 tablet (40 mg total) by mouth daily.    Dispense:  30 tablet    Refill:  11    Discharge today   diclofenac Sodium (VOLTAREN) 1 % GEL    Sig: Apply 2 g topically 4 (four) times daily.    Dispense:  4 g    Refill:  2     Follow up: 2 months    Should you have any questions or concerns please call the internal medicine clinic at [phone].    Kathleen Lime, M.D [facility]

## 2023-01-21 NOTE — Progress Notes (Signed)
Internal Medicine Clinic Attending  I saw and evaluated the patient.  I personally confirmed the key portions of the history and exam documented by Dr.  Mickie Bail  and I reviewed pertinent patient test results.  The assessment, diagnosis, and plan were formulated together and I agree with the documentation in the resident's note.     Note: [name] listed pronouns in chart are she/her. Dr. Mickie Bail and I asked the patient what they would like to be called & what their pronouns are - they said "[name]".   Patient presents after recent hospitalization for depressive episode. [name] is being cared for closely by the ACT team. [name]' main concern today is left shoulder pain. Antario has full range of motion in the left shoulder and no pain with abduction. Normal strength. Will try voltaren gel over the main area of pain.

## 2023-01-31 ENCOUNTER — Emergency Department (HOSPITAL_BASED_OUTPATIENT_CLINIC_OR_DEPARTMENT_OTHER): Payer: MEDICAID

## 2023-01-31 ENCOUNTER — Emergency Department (HOSPITAL_COMMUNITY)
Admission: EM | Admit: 2023-01-31 | Discharge: 2023-01-31 | Disposition: A | Discharge status: Home / Self Care | Payer: MEDICAID | Attending: Emergency Medicine | Admitting: Emergency Medicine

## 2023-01-31 ENCOUNTER — Encounter (HOSPITAL_COMMUNITY): Payer: Self-pay

## 2023-01-31 ENCOUNTER — Other Ambulatory Visit: Payer: Self-pay

## 2023-01-31 DIAGNOSIS — M79661 Pain in right lower leg: Secondary | ICD-10-CM | POA: Diagnosis not present

## 2023-01-31 DIAGNOSIS — R2243 Localized swelling, mass and lump, lower limb, bilateral: Secondary | ICD-10-CM | POA: Diagnosis not present

## 2023-01-31 NOTE — Discharge Instructions (Signed)
You were seen in the emergency department today for bumps on both of your legs.  The ultrasound was negative for any signs of blood clots.  Please follow-up with your primary care provider for further evaluation and potential further testing if these do not resolve.  If your symptoms are worsening, please return to the emergency department.

## 2023-01-31 NOTE — ED Triage Notes (Signed)
Patient has had 4 hard knots on both legs that appeared 2 weeks ago. Painful to touch. Immobile and hard. One knot on right shin, left outer knee, left thigh, one on his buttocks.

## 2023-01-31 NOTE — Progress Notes (Signed)
Lower extremity venous bilateral study completed.  Preliminary results relayed to [place]owl[place], PA.   See CV Proc for preliminary results report.   Jean Rosenthal, RDMS, RVT

## 2023-01-31 NOTE — ED Provider Notes (Signed)
[place] EMERGENCY DEPARTMENT AT [facility] Provider Note   CSN: [id number] Arrival date & time: 01/31/23  1446     History Chief Complaint  Patient presents with   Knots on Legs    [name] is a 42 y.o. male.  Patient presented to the emergency department complaints of knots on legs.  He reports he has been experiencing knots that have popped up over the last few days and have not reduce in size.  Reports multiple areas on the right anterior leg below the knee as well as the left anterior leg below the knee.  Denies any IV drug use.  Prior history of blood clot requiring anticoagulant use.  Denies any recent surgery or prolonged immobilization.  HPI     Home Medications Prior to Admission medications   Medication Sig Start Date End Date Taking? Authorizing Provider  amLODipine (NORVASC) 5 MG tablet Take 1 tablet (5 mg total) by mouth daily. 01/18/23   Kathleen Lime, MD  atorvastatin (LIPITOR) 40 MG tablet Take 1 tablet (40 mg total) by mouth daily. 01/18/23   Kathleen Lime, MD  busPIRone (BUSPAR) 10 MG tablet Take 1 tablet (10 mg total) by mouth 3 (three) times daily. 01/07/23   Clapacs, Jackquline Denmark, MD  diclofenac Sodium (VOLTAREN) 1 % GEL Apply 2 g topically 4 (four) times daily. 01/18/23   Kathleen Lime, MD  divalproex (DEPAKOTE) 500 MG DR tablet Take 1 tablet (500 mg total) by mouth every 12 (twelve) hours. 01/07/23 03/08/23  Clapacs, Jackquline Denmark, MD  gabapentin (NEURONTIN) 300 MG capsule Take 1 capsule (300 mg total) by mouth 3 (three) times daily. 01/07/23   Clapacs, Jackquline Denmark, MD  neomycin-bacitracin-polymyxin [phone] OINT Apply 1 Application topically 2 (two) times daily. 01/07/23   Clapacs, Jackquline Denmark, MD  thiamine (VITAMIN B-1) 100 MG tablet Take 1 tablet (100 mg total) by mouth daily. 01/08/23   Clapacs, Jackquline Denmark, MD  traZODone (DESYREL) 100 MG tablet Take 1 tablet (100 mg total) by mouth at bedtime as needed for sleep. 01/07/23   Clapacs, Jackquline Denmark, MD      Allergies    Codeine,  Penicillins, Morphine, Coconut (cocos nucifera), Grapefruit concentrate, Morphine and codeine, Oxycodone, and Norco [hydrocodone-acetaminophen]    Review of Systems   Review of Systems  Musculoskeletal:        Bumps on legs  All other systems reviewed and are negative.   Physical Exam Updated Vital Signs BP 125/77 (BP Location: Left Arm)   Pulse (!) 104   Temp 98.1 F (36.7 C) (Oral)   Resp 15   Ht 5\' 6"  (1.676 m)   Wt 75 kg   SpO2 98%   BMI 26.69 kg/m  Physical Exam Vitals and nursing note reviewed.  Constitutional:      General: He is not in acute distress.    Appearance: He is well-developed.  HENT:     Head: Normocephalic and atraumatic.  Eyes:     Conjunctiva/sclera: Conjunctivae normal.  Cardiovascular:     Rate and Rhythm: Normal rate and regular rhythm.     Heart sounds: No murmur heard. Pulmonary:     Effort: Pulmonary effort is normal. No respiratory distress.     Breath sounds: Normal breath sounds.  Abdominal:     Palpations: Abdomen is soft.     Tenderness: There is no abdominal tenderness.  Musculoskeletal:        General: No swelling.     Cervical back: Neck supple.  Legs:     Comments: Multiple areas of firm bumps which are slightly tender and immobile on examination.  Skin:    General: Skin is warm and dry.     Capillary Refill: Capillary refill takes less than 2 seconds.  Neurological:     Mental Status: He is alert.  Psychiatric:        Mood and Affect: Mood normal.     ED Results / Procedures / Treatments   Labs (all labs ordered are listed, but only abnormal results are displayed) Labs Reviewed - No data to display  EKG None  Radiology VAS [place] LOWER EXTREMITY VENOUS (DVT) (7a-7p)  Result Date: 01/31/2023  Lower Venous DVT Study Patient Name:  [name]  Date of Exam:   01/31/2023 Medical Rec #: [id number]    Accession #:    [id number] Date of Birth: [birth year]   Patient Gender: M Patient Age:   61 years Exam Location:  [facility] Procedure:      VAS [place] LOWER EXTREMITY VENOUS (DVT) Referring Phys: Maryanna Shape --------------------------------------------------------------------------------  Indications: Multiple palpable areas of bilateral lower extremities x2 week.  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 Comparison Study: No recent prior studies. Performing Technologist: Jean Rosenthal RDMS, RVT  Examination Guidelines: A complete evaluation includes B-mode imaging, spectral Doppler, color Doppler, and power Doppler as needed of all accessible portions of each vessel. Bilateral testing is considered an integral part of a complete examination. Limited examinations for reoccurring indications may be performed as noted. The reflux portion of the exam is performed with the patient in reverse Trendelenburg.  +---------+---------------+---------+-----------+-------------+--------------+ RIGHT    CompressibilityPhasicitySpontaneityProperties   Thrombus Aging +---------+---------------+---------+-----------+-------------+--------------+ CFV      Full           Yes      Yes                                    +---------+---------------+---------+-----------+-------------+--------------+ SFJ      Full                                                           +---------+---------------+---------+-----------+-------------+--------------+ FV Prox  Full                                                           +---------+---------------+---------+-----------+-------------+--------------+ FV Mid   Full                                                           +---------+---------------+---------+-----------+-------------+--------------+ FV DistalFull                                                           +---------+---------------+---------+-----------+-------------+--------------+  PFV      Full                                                            +---------+---------------+---------+-----------+-------------+--------------+ POP      Full           No       Yes        Rouleaux flow               +---------+---------------+---------+-----------+-------------+--------------+ PTV      Full                                                           +---------+---------------+---------+-----------+-------------+--------------+ PERO     Full                                                           +---------+---------------+---------+-----------+-------------+--------------+ Gastroc  Full                                                           +---------+---------------+---------+-----------+-------------+--------------+   +---------+---------------+---------+-----------+-------------+--------------+ LEFT     CompressibilityPhasicitySpontaneityProperties   Thrombus Aging +---------+---------------+---------+-----------+-------------+--------------+ CFV      Full           Yes      Yes                                    +---------+---------------+---------+-----------+-------------+--------------+ SFJ      Full                                                           +---------+---------------+---------+-----------+-------------+--------------+ FV Prox  Full                                                           +---------+---------------+---------+-----------+-------------+--------------+ FV Mid   Full                                                           +---------+---------------+---------+-----------+-------------+--------------+ FV DistalFull                                                           +---------+---------------+---------+-----------+-------------+--------------+  PFV      Full                                                           +---------+---------------+---------+-----------+-------------+--------------+ POP      Full           No        Yes        Rouleaux flow               +---------+---------------+---------+-----------+-------------+--------------+ PTV      Full                                                           +---------+---------------+---------+-----------+-------------+--------------+ PERO     Full                                                           +---------+---------------+---------+-----------+-------------+--------------+ Gastroc  Full                                                           +---------+---------------+---------+-----------+-------------+--------------+     Summary: RIGHT: - There is no evidence of deep vein thrombosis in the lower extremity.  - No cystic structure found in the popliteal fossa.  - Incidental: Multiple avascular, hyperechoic masses with posterior shadowing noted subcutaneously throughout the lower extremities in patient areas of palpation. Etiology unknown.  LEFT: - There is no evidence of deep vein thrombosis in the lower extremity.  - No cystic structure found in the popliteal fossa.  - Incidental: Multiple avascular, hyperechoic masses with posterior shadowing noted subcutaneously throughout the lower extremities in patient areas of palpation. Etiology unknown.  *See table(s) above for measurements and observations. Electronically signed by Lemar Livings MD on 01/31/2023 at 7:13:36 PM.    Final     Procedures Procedures   Medications Ordered in ED Medications - No data to display  ED Course/ Medical Decision Making/ A&P                           Medical Decision Making  This patient presents to the ED for concern of leg bumps.  Differential diagnosis includes DVT, cellulitis, lymphadenopathy,    Imaging Studies ordered:  I ordered imaging studies including ultrasound of the bilateral lower extremities I independently visualized and interpreted imaging which showed no evidence of DVT, numerous hypoechoic avascular areas I agree with the  radiologist interpretation   Problem List / ED Course:  Patient with extensive past medical history including substance abuse as well as psychiatric disorders presents to the emergency department concerns of bumps on bilateral lower legs.  On examination, there are numerous ones present in the lower extremities with patient denies any IV drug use.  Will order ultrasound imaging of bilateral legs for possible DVT rule out. Ultrasound negative for any signs of DVT.  There are multiple avascular hypoechoic areas but unsure of exact etiology. Advised patient of findings encourage patient to follow-up with primary care provider for further evaluation possible further testing. No acute indication for further imaging at this time. Patient agreeable with treatment plan and verbalized understanding all return precautions. All questions answered prior to patient discharge.  Final Clinical Impression(s) / ED Diagnoses Final diagnoses:  Leg mass, bilateral    Rx / DC Orders ED Discharge Orders     None         Salomon Mast 01/31/23 2044    Wynetta Fines, MD 01/31/23 2250

## 2023-02-01 ENCOUNTER — Other Ambulatory Visit: Payer: Self-pay

## 2023-02-01 ENCOUNTER — Emergency Department (HOSPITAL_COMMUNITY)
Admission: EM | Admit: 2023-02-01 | Discharge: 2023-02-02 | Discharge status: Against Medical Advice | Payer: MEDICAID | Attending: Emergency Medicine | Admitting: Emergency Medicine

## 2023-02-01 ENCOUNTER — Encounter (HOSPITAL_COMMUNITY): Payer: Self-pay

## 2023-02-01 DIAGNOSIS — L02416 Cutaneous abscess of left lower limb: Secondary | ICD-10-CM | POA: Diagnosis not present

## 2023-02-01 DIAGNOSIS — Z5321 Procedure and treatment not carried out due to patient leaving prior to being seen by health care provider: Secondary | ICD-10-CM | POA: Diagnosis not present

## 2023-02-01 DIAGNOSIS — L02415 Cutaneous abscess of right lower limb: Secondary | ICD-10-CM | POA: Insufficient documentation

## 2023-02-01 NOTE — ED Triage Notes (Signed)
Seen and treated at [facility] ED yesterday for abscess or knots on legs.  Says since being discharged more "knots" have manifested now on his butt.   Had concern for blood clot yesterday but says they did an [place] to rule out.

## 2023-02-01 NOTE — ED Notes (Signed)
Pt called [facility] for triage. Pt could not be found.  

## 2023-02-03 ENCOUNTER — Encounter (HOSPITAL_COMMUNITY): Payer: Self-pay | Admitting: Emergency Medicine

## 2023-02-03 ENCOUNTER — Emergency Department (HOSPITAL_COMMUNITY)
Admission: EM | Admit: 2023-02-03 | Discharge: 2023-02-03 | Disposition: A | Discharge status: Home / Self Care | Payer: MEDICAID | Attending: Emergency Medicine | Admitting: Emergency Medicine

## 2023-02-03 ENCOUNTER — Other Ambulatory Visit: Payer: Self-pay

## 2023-02-03 DIAGNOSIS — Z85528 Personal history of other malignant neoplasm of kidney: Secondary | ICD-10-CM | POA: Insufficient documentation

## 2023-02-03 DIAGNOSIS — M7989 Other specified soft tissue disorders: Secondary | ICD-10-CM | POA: Diagnosis present

## 2023-02-03 DIAGNOSIS — L52 Erythema nodosum: Secondary | ICD-10-CM | POA: Diagnosis not present

## 2023-02-03 DIAGNOSIS — E860 Dehydration: Secondary | ICD-10-CM

## 2023-02-03 DIAGNOSIS — Z853 Personal history of malignant neoplasm of breast: Secondary | ICD-10-CM | POA: Insufficient documentation

## 2023-02-03 LAB — CBC WITH DIFFERENTIAL/PLATELET
Abs Immature Granulocytes: 0.03 10*3/uL (ref 0.00–0.07)
Basophils Absolute: 0 10*3/uL (ref 0.0–0.1)
Basophils Relative: 1 %
Eosinophils Absolute: 0.2 10*3/uL (ref 0.0–0.5)
Eosinophils Relative: 4 %
HCT: 42.1 % (ref 39.0–52.0)
Hemoglobin: 14.1 g/dL (ref 13.0–17.0)
Immature Granulocytes: 1 %
Lymphocytes Relative: 27 %
Lymphs Abs: 1.7 10*3/uL (ref 0.7–4.0)
MCH: 29.3 pg (ref 26.0–34.0)
MCHC: 33.5 g/dL (ref 30.0–36.0)
MCV: 87.5 fL (ref 80.0–100.0)
Monocytes Absolute: 0.5 10*3/uL (ref 0.1–1.0)
Monocytes Relative: 8 %
Neutro Abs: 3.7 10*3/uL (ref 1.7–7.7)
Neutrophils Relative %: 59 %
Platelets: 235 10*3/uL (ref 150–400)
RBC: 4.81 MIL/uL (ref 4.22–5.81)
RDW: 13.4 % (ref 11.5–15.5)
WBC: 6.2 10*3/uL (ref 4.0–10.5)
nRBC: 0 % (ref 0.0–0.2)

## 2023-02-03 LAB — COMPREHENSIVE METABOLIC PANEL
ALT: 11 U/L (ref 0–44)
AST: 23 U/L (ref 15–41)
Albumin: 3.2 g/dL — ABNORMAL LOW (ref 3.5–5.0)
Alkaline Phosphatase: 62 U/L (ref 38–126)
Anion gap: 9 (ref 5–15)
BUN: 14 mg/dL (ref 6–20)
CO2: 24 mmol/L (ref 22–32)
Calcium: 8.6 mg/dL — ABNORMAL LOW (ref 8.9–10.3)
Chloride: 99 mmol/L (ref 98–111)
Creatinine, Ser: 0.83 mg/dL (ref 0.61–1.24)
GFR, Estimated: >60 mL/min (ref >60)
Glucose, Bld: 99 mg/dL (ref 70–99)
Potassium: 3.6 mmol/L (ref 3.5–5.1)
Sodium: 132 mmol/L — ABNORMAL LOW (ref 135–145)
Total Bilirubin: 0.8 mg/dL (ref 0.3–1.2)
Total Protein: 8.6 g/dL — ABNORMAL HIGH (ref 6.5–8.1)

## 2023-02-03 MED ORDER — IBUPROFEN 200 MG PO TABS
600.0000 mg | ORAL_TABLET | Freq: Once | ORAL | Status: AC
  Administered 2023-02-03: 600 mg via ORAL
  Filled 2023-02-03: qty 3

## 2023-02-03 MED ORDER — SODIUM CHLORIDE 0.9 % IV BOLUS
1000.0000 mL | Freq: Once | INTRAVENOUS | Status: AC
  Administered 2023-02-03: 1000 mL via INTRAVENOUS

## 2023-02-03 NOTE — Discharge Instructions (Addendum)
Your skin bumps are likely due to a condition called erythema nodosum.  You may use up to 600mg  ibuprofen every 6 hours as needed for pain.   Please continue to keep hydrated today, as you were likely dehydrated.  Please follow up with your PCP in 2 weeks to ensure your skin bumps are improving.

## 2023-02-03 NOTE — ED Provider Notes (Signed)
[place] EMERGENCY DEPARTMENT AT [facility] Provider Note   CSN: [id number] Arrival date & time: 02/03/23  1219     History  Chief Complaint  Patient presents with   Abscess    [name] is a 42 y.o. male with history of breast cancer, kidney cancer, liver cirrhosis, multiple psychiatric disorders, who presents with concern for knots on his legs that have been there for the past 2 weeks.  He notes there are more knots appearing in his legs and now a new one on his buttock.  He denies any fever or chills.  Denies any recent illnesses. He does report a sore throat.    Abscess      Home Medications Prior to Admission medications   Medication Sig Start Date End Date Taking? Authorizing Provider  amLODipine (NORVASC) 5 MG tablet Take 1 tablet (5 mg total) by mouth daily. 01/18/23   Kathleen Lime, MD  atorvastatin (LIPITOR) 40 MG tablet Take 1 tablet (40 mg total) by mouth daily. 01/18/23   Kathleen Lime, MD  busPIRone (BUSPAR) 10 MG tablet Take 1 tablet (10 mg total) by mouth 3 (three) times daily. 01/07/23   Clapacs, Jackquline Denmark, MD  diclofenac Sodium (VOLTAREN) 1 % GEL Apply 2 g topically 4 (four) times daily. 01/18/23   Kathleen Lime, MD  divalproex (DEPAKOTE) 500 MG DR tablet Take 1 tablet (500 mg total) by mouth every 12 (twelve) hours. 01/07/23 03/08/23  Clapacs, Jackquline Denmark, MD  gabapentin (NEURONTIN) 300 MG capsule Take 1 capsule (300 mg total) by mouth 3 (three) times daily. 01/07/23   Clapacs, Jackquline Denmark, MD  neomycin-bacitracin-polymyxin [phone] OINT Apply 1 Application topically 2 (two) times daily. 01/07/23   Clapacs, Jackquline Denmark, MD  thiamine (VITAMIN B-1) 100 MG tablet Take 1 tablet (100 mg total) by mouth daily. 01/08/23   Clapacs, Jackquline Denmark, MD  traZODone (DESYREL) 100 MG tablet Take 1 tablet (100 mg total) by mouth at bedtime as needed for sleep. 01/07/23   Clapacs, Jackquline Denmark, MD      Allergies    Codeine, Penicillins, Morphine, Coconut (cocos nucifera), Grapefruit concentrate,  Morphine and codeine, Oxycodone, and Norco [hydrocodone-acetaminophen]    Review of Systems   Review of Systems  Skin:        Knots on his legs    Physical Exam Updated Vital Signs BP 120/89 (BP Location: Left Arm)   Pulse 97   Temp 98.1 F (36.7 C) (Oral)   Resp 17   Ht 5\' 6"  (1.676 m)   Wt 75 kg   SpO2 97%   BMI 26.69 kg/m  Physical Exam Vitals and nursing note reviewed.  Constitutional:      Appearance: Normal appearance.  HENT:     Head: Atraumatic.     Mouth/Throat:     Mouth: Mucous membranes are moist.     Pharynx: No oropharyngeal exudate or posterior oropharyngeal erythema.     Comments: Posterior oropharynx and tonsils without erythema, exudate Cardiovascular:     Rate and Rhythm: Normal rate and regular rhythm.  Pulmonary:     Effort: Pulmonary effort is normal.  Skin:    Comments: Firm nodules approximately 1cm  in diameter felt under the skin of the legs bilaterally No overlying skin changes No edema, erythema, rashes  Neurological:     General: No focal deficit present.     Mental Status: He is alert.  Psychiatric:        Mood and Affect: Mood normal.  Behavior: Behavior normal.     ED Results / Procedures / Treatments   Labs (all labs ordered are listed, but only abnormal results are displayed) Labs Reviewed  COMPREHENSIVE METABOLIC PANEL - Abnormal; Notable for the following components:      Result Value   Sodium 132 (*)    Calcium 8.6 (*)    Total Protein 8.6 (*)    Albumin 3.2 (*)    All other components within normal limits  CBC WITH DIFFERENTIAL/PLATELET    EKG None  Radiology No results found.  Procedures Procedures    Medications Ordered in ED Medications  sodium chloride 0.9 % bolus 1,000 mL (1,000 mLs Intravenous New Bag/Given 02/03/23 1532)  ibuprofen (ADVIL) tablet 600 mg (600 mg Oral Given 02/03/23 1545)    ED Course/ Medical Decision Making/ A&P                             Medical Decision Making Amount  and/or Complexity of Data Reviewed Labs: ordered.  Risk OTC drugs.   42 y.o. male with pertinent past medical history of breast cancer, kidney cancer, liver cirrhosis, multiple psychiatric disorders presents to the ED for concern of knots on his legs  Differential diagnosis includes but is not limited to cellulitis, abscess, folliculitis, erythema nodosum, dehydration  ED Course:  Patient overall well-appearing with firm knots appreciated underneath the skin on his legs bilaterally.  No overlying skin changes.  Suspect erythema nodosum.  Will treat his pain with ibuprofen and have him follow-up with primary care provider.  He initially had a elevated heart rate of 114 upon arrival, no white count or drop in hemoglobin on CBC and abnormalities on CMP.  No fevers. Suspect dehydration, he was given a bolus of normal saline and heart rate now normal at 97.  Other vital signs are within normal limits.   Impression: Erythema nodosum Dehydration  Disposition:  The patient was discharged home with instructions to take 600 mg ibuprofen every 8 hours as needed for pain, keep hydrated.  Instructed to follow-up with his primary care provider within 2 weeks. Return precautions given.  Lab Tests: I Ordered, and personally interpreted labs.  The pertinent results include:   CBC with no leukocytosis or drop in hemoglobin CMP with no concerning abnormalities     Co morbidities that complicate the patient evaluation  breast cancer, kidney cancer, liver cirrhosis, multiple psychiatric disorders  Social Determinants of Health:  Psychiatric disorders, substance use              Final Clinical Impression(s) / ED Diagnoses Final diagnoses:  Erythema nodosum  Dehydration    Rx / DC Orders ED Discharge Orders     None         Arabella Merles, PA-C 02/03/23 1635    Lonell Grandchild, MD 02/05/23 1202

## 2023-02-03 NOTE — ED Triage Notes (Signed)
Pt states he was seen her recently for abscess and has 4 more now. Unable to see PCP for a few weeks. Negative DVT study this week.

## 2023-02-09 ENCOUNTER — Other Ambulatory Visit: Payer: Self-pay

## 2023-02-09 ENCOUNTER — Encounter: Payer: Self-pay | Admitting: Student

## 2023-02-09 ENCOUNTER — Ambulatory Visit: Payer: MEDICAID | Admitting: Student

## 2023-02-09 ENCOUNTER — Telehealth: Payer: Self-pay

## 2023-02-09 VITALS — BP 118/82 | HR 110 | Temp 98.1°F | Ht 66.0 in | Wt 161.6 lb

## 2023-02-09 DIAGNOSIS — L299 Pruritus, unspecified: Secondary | ICD-10-CM | POA: Diagnosis not present

## 2023-02-09 DIAGNOSIS — L52 Erythema nodosum: Secondary | ICD-10-CM

## 2023-02-09 DIAGNOSIS — F1020 Alcohol dependence, uncomplicated: Secondary | ICD-10-CM

## 2023-02-09 DIAGNOSIS — Z905 Acquired absence of kidney: Secondary | ICD-10-CM

## 2023-02-09 LAB — HEPATIC FUNCTION PANEL
ALT: 13 U/L (ref 0–44)
AST: 32 U/L (ref 15–41)
Albumin: 3.1 g/dL — ABNORMAL LOW (ref 3.5–5.0)
Alkaline Phosphatase: 64 U/L (ref 38–126)
Bilirubin, Direct: 0.1 mg/dL (ref 0.0–0.2)
Indirect Bilirubin: 0.1 mg/dL — ABNORMAL LOW (ref 0.3–0.9)
Total Bilirubin: 0.2 mg/dL — ABNORMAL LOW (ref 0.3–1.2)
Total Protein: 8.4 g/dL — ABNORMAL HIGH (ref 6.5–8.1)

## 2023-02-09 MED ORDER — DOXYCYCLINE HYCLATE 100 MG PO TBEC: 100.0000 mg | DELAYED_RELEASE_TABLET | Freq: Two times a day (BID) | ORAL | 0 refills | Status: DC

## 2023-02-09 NOTE — Assessment & Plan Note (Signed)
Patient presented to the office 3 weeks ago with concerns of itching, was advised to use Sarna lotion but reports it did not help.  He complains of itching on his whole body from head to toe, says nothing makes it worse or better.  There is no hives or flares on the skin, not dry or flaky, no rash, no signs of eczema.  Could be an underlying systemic condition. -LFTs -Total protein -Albumin -Bilirubin
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
## 2023-02-09 NOTE — Telephone Encounter (Signed)
Pa for pt ( DOXCYCYLINE HYCLATE ) came through on cover my meds but there is no  associated  DX with the RX I have reached out to PCP  to get one so I can complete the PA

## 2023-02-09 NOTE — Assessment & Plan Note (Addendum)
Recent hospital follow-up, for which he presented due to concerns for multiple firm nodules on the leg bilaterally.  Patient reports the nodules appeared as a single nodule on the left leg and progressed to other parts of the lower extremities.he described nodules as very painful 9 out of 10 and nothing seems to make it better .On exam there is no edema or redness and the overlying skin is intact.  There are multiple firm nodules on both lower extremities and on the butt cheek. The nodules around and immobile, less suspicion for lipomas at this time.  Patient has a history of renal cell carcinoma and breast cancer but has not followed up with his oncologist for many years. This could likely be erythema nodosum, firm, hard and painful but negative for erythema, most likely due to patient's complexion which can make it hard to differentiate between the normal skin pigmentation and erythema on the nodules.  Will consider referring patient to dermatologist for further assessment and follow-up with a CT abdomen and pelvis and chest due to the history of carcinoma, and also prescribe a short course of doxycycline for a week to cover any possible infection etiology. -Referral to dermatologist -Doxycycline 100 mg twice daily for a week -CT chest/abdomen/pelvis -Follow-up with oncologist  -Follow-up in the clinic in 3 weeks

## 2023-02-09 NOTE — Patient Instructions (Signed)
Thank you, Mr.Rajvir Budhu for allowing [place] to provide your care today. Today we discussed your general health and your recent hospital admission.  Today, We will order some imaging, refer you to a dermatologist, doxycycline 100 mg twice daily for a week, and follow-up with the oncologist.   I have ordered the following labs for you:  Lab Orders         Liver Profile         Protein,Total,Urine         Bilirubin,Direct/Indirect(Fractionated)         Albumin       Tests ordered today:    Referrals ordered today:   Referral Orders  No referral(s) requested today     I have ordered the following medication/changed the following medications:   Stop the following medications: There are no discontinued medications.   Start the following medications: Meds ordered this encounter  Medications   doxycycline (DORYX) 100 MG EC tablet    Sig: Take 1 tablet (100 mg total) by mouth 2 (two) times daily.    Dispense:  14 tablet    Refill:  0     Follow up:  3 weeks     Remember:   Should you have any questions or concerns please call the internal medicine clinic at [phone].    Kathleen Lime, M.D [facility]

## 2023-02-10 ENCOUNTER — Other Ambulatory Visit: Payer: Self-pay | Admitting: Student

## 2023-02-10 MED ORDER — DOXYCYCLINE HYCLATE 50 MG PO CAPS: 50.0000 mg | ORAL_CAPSULE | Freq: Two times a day (BID) | ORAL | 0 refills | Status: DC

## 2023-02-10 NOTE — Telephone Encounter (Signed)
Decision:Denied Your request has been denied Denied  Fyi (There wasn't any information listed why PA was denied)

## 2023-02-10 NOTE — Telephone Encounter (Addendum)
Prior Authorization was submitted with last office notes awaiting approval or denial.  KEY:[id number]

## 2023-02-11 NOTE — Telephone Encounter (Signed)
I have resubmitted the PA with the DX  and is awaiting new decision   ... Take sup to 24 hours for a decision

## 2023-02-11 NOTE — Progress Notes (Signed)
Internal Medicine Clinic Attending  I was physically present during the key portions of the resident provided service and participated in the medical decision making of patient's management care. I reviewed pertinent patient test results.  The assessment, diagnosis, and plan were formulated together and I agree with the documentation in the resident's note.  Narendra, Nischal, MD  

## 2023-02-14 NOTE — Telephone Encounter (Signed)
NEW DECISION :  Outcome Approved on July 26 by [employer] 2017   Approved. DOXYCYCLINE HYCLATE 100MG  Tablet DR is approved from 02/11/2023 to 02/11/2024.    All strengths of the drug are approved.   Authorization Expiration Date: 02/11/2024   Drug Doxycycline Hyclate 100MG  dr tablets   ePA cloud logo Form [employer] Electronic Prior Authorization Form      ( COPY SENT TO PHARMACY AND PLACE TO SCAN TO CHART ALSO )

## 2023-02-15 NOTE — Telephone Encounter (Signed)
Thank you :)

## 2023-02-28 ENCOUNTER — Telehealth: Payer: Self-pay | Admitting: *Deleted

## 2023-02-28 NOTE — Telephone Encounter (Signed)
Patient contacted regarding CHEST CT ABD PELVIS at [place] @ 9 am 03/18/2023. Patient aware of this appointment,
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
## 2023-03-15 NOTE — Progress Notes (Deleted)
   Subjective:    Patient ID: [name], male    DOB: 06-Apr-1981, 42 y.o.   MRN: [id number]  HPI   .CPR Review of Systems     Objective:   Physical Exam        Assessment & Plan:

## 2023-03-16 ENCOUNTER — Encounter: Payer: MEDICAID | Attending: Physical Medicine and Rehabilitation | Admitting: Physical Medicine and Rehabilitation

## 2023-03-16 ENCOUNTER — Encounter: Payer: Self-pay | Admitting: Physical Medicine and Rehabilitation

## 2023-03-16 VITALS — BP 116/82 | HR 86 | Ht 66.0 in | Wt 159.0 lb

## 2023-03-16 DIAGNOSIS — I70219 Atherosclerosis of native arteries of extremities with intermittent claudication, unspecified extremity: Secondary | ICD-10-CM

## 2023-03-16 DIAGNOSIS — M792 Neuralgia and neuritis, unspecified: Secondary | ICD-10-CM | POA: Insufficient documentation

## 2023-03-16 DIAGNOSIS — G8929 Other chronic pain: Secondary | ICD-10-CM | POA: Insufficient documentation

## 2023-03-16 DIAGNOSIS — M25512 Pain in left shoulder: Secondary | ICD-10-CM

## 2023-03-16 MED ORDER — GABAPENTIN 300 MG PO CAPS: ORAL_CAPSULE | ORAL | 3 refills | Status: DC

## 2023-03-16 NOTE — Patient Instructions (Signed)
I am increasing your gabapentin, you are now going to take 300 mg capsules with 1 capsule in the morning, 1 in the afternoon, and 2 at nighttime.  You already have Voltaren gel prescribed, I recommend using this on your neck and shoulders for tenderness, muscle cramps.  Since this is essentially topical ibuprofen, I recommend that you take this instead of ibuprofen due to risk of that medication hurting your kidneys.  I am ordering an x-ray of your neck to look at potential points of arthritis or nerve pinching.  I will also be looking for the results of your CT, as this should show some of the muscles and bones in your upper and lower back.  I will give you a call if we find anything abnormal.  Obtain over-the-counter bilateral wrist braces and wear them at nighttime for at least 6 weeks to see if these help with your symptoms in your hands.  I am sending you to physical therapy to work on range of motion of your neck, shoulders.  If you are unable to afford co-pay, here is a list of pro bono clinics to try. Local [facility] PT clinics:  1) [facility] [email or web address] Clinic #: [phone]   2) [place] [facility]  [email or web address] Clinic #: [phone]  3) [facility] [email or web address] Clinic #: [phone]   Given your medical history, I do not feel that controlled substances are appropriate for you medication wise, and will not be performing a pain contract or prescribing them at this time.  Follow-up with me in 3 months after finishing physical therapy, if no improvement at that time we can discuss repeating your EMG and resuming another medication for nerve pain.

## 2023-03-17 NOTE — Progress Notes (Signed)
This encounter was created in error - please disregard.

## 2023-03-18 ENCOUNTER — Other Ambulatory Visit: Payer: Self-pay

## 2023-03-18 ENCOUNTER — Emergency Department (HOSPITAL_COMMUNITY)
Admission: EM | Admit: 2023-03-18 | Discharge: 2023-03-18 | Disposition: A | Discharge status: Home / Self Care | Payer: MEDICAID | Attending: Emergency Medicine | Admitting: Emergency Medicine

## 2023-03-18 ENCOUNTER — Encounter (HOSPITAL_COMMUNITY): Payer: Self-pay

## 2023-03-18 ENCOUNTER — Emergency Department (HOSPITAL_COMMUNITY): Payer: MEDICAID

## 2023-03-18 ENCOUNTER — Ambulatory Visit (HOSPITAL_COMMUNITY)
Admission: RE | Admit: 2023-03-18 | Discharge: 2023-03-18 | Disposition: A | Discharge status: Home / Self Care | Payer: MEDICAID | Source: Ambulatory Visit | Attending: Internal Medicine | Admitting: Internal Medicine

## 2023-03-18 DIAGNOSIS — H539 Unspecified visual disturbance: Secondary | ICD-10-CM | POA: Insufficient documentation

## 2023-03-18 DIAGNOSIS — Z905 Acquired absence of kidney: Secondary | ICD-10-CM | POA: Insufficient documentation

## 2023-03-18 DIAGNOSIS — Z853 Personal history of malignant neoplasm of breast: Secondary | ICD-10-CM | POA: Insufficient documentation

## 2023-03-18 DIAGNOSIS — I10 Essential (primary) hypertension: Secondary | ICD-10-CM | POA: Insufficient documentation

## 2023-03-18 DIAGNOSIS — Z79899 Other long term (current) drug therapy: Secondary | ICD-10-CM | POA: Insufficient documentation

## 2023-03-18 DIAGNOSIS — I2510 Atherosclerotic heart disease of native coronary artery without angina pectoris: Secondary | ICD-10-CM | POA: Insufficient documentation

## 2023-03-18 LAB — CBC WITH DIFFERENTIAL/PLATELET
Abs Immature Granulocytes: 0.01 10*3/uL (ref 0.00–0.07)
Basophils Absolute: 0 10*3/uL (ref 0.0–0.1)
Basophils Relative: 1 %
Eosinophils Absolute: 0.2 10*3/uL (ref 0.0–0.5)
Eosinophils Relative: 3 %
HCT: 43 % (ref 39.0–52.0)
Hemoglobin: 14 g/dL (ref 13.0–17.0)
Immature Granulocytes: 0 %
Lymphocytes Relative: 39 %
Lymphs Abs: 2.3 10*3/uL (ref 0.7–4.0)
MCH: 29.2 pg (ref 26.0–34.0)
MCHC: 32.6 g/dL (ref 30.0–36.0)
MCV: 89.6 fL (ref 80.0–100.0)
Monocytes Absolute: 0.5 10*3/uL (ref 0.1–1.0)
Monocytes Relative: 8 %
Neutro Abs: 2.9 10*3/uL (ref 1.7–7.7)
Neutrophils Relative %: 49 %
Platelets: 258 10*3/uL (ref 150–400)
RBC: 4.8 MIL/uL (ref 4.22–5.81)
RDW: 13.4 % (ref 11.5–15.5)
WBC: 5.9 10*3/uL (ref 4.0–10.5)
nRBC: 0 % (ref 0.0–0.2)

## 2023-03-18 LAB — COMPREHENSIVE METABOLIC PANEL
ALT: 13 U/L (ref 0–44)
AST: 23 U/L (ref 15–41)
Albumin: 3.5 g/dL (ref 3.5–5.0)
Alkaline Phosphatase: 61 U/L (ref 38–126)
Anion gap: 7 (ref 5–15)
BUN: 15 mg/dL (ref 6–20)
CO2: 28 mmol/L (ref 22–32)
Calcium: 8.9 mg/dL (ref 8.9–10.3)
Chloride: 101 mmol/L (ref 98–111)
Creatinine, Ser: 0.87 mg/dL (ref 0.61–1.24)
GFR, Estimated: >60 mL/min (ref >60)
Glucose, Bld: 94 mg/dL (ref 70–99)
Potassium: 4.1 mmol/L (ref 3.5–5.1)
Sodium: 136 mmol/L (ref 135–145)
Total Bilirubin: 0.5 mg/dL (ref 0.3–1.2)
Total Protein: 8.6 g/dL — ABNORMAL HIGH (ref 6.5–8.1)

## 2023-03-18 MED ORDER — SODIUM CHLORIDE (PF) 0.9 % IJ SOLN
INTRAMUSCULAR | Status: AC
  Filled 2023-03-18: qty 50

## 2023-03-18 MED ORDER — TETRACAINE HCL 0.5 % OP SOLN
1.0000 [drp] | Freq: Once | OPHTHALMIC | Status: AC
  Administered 2023-03-18: 1 [drp] via OPHTHALMIC
  Filled 2023-03-18: qty 4

## 2023-03-18 MED ORDER — IOHEXOL 300 MG/ML  SOLN
75.0000 mL | Freq: Once | INTRAMUSCULAR | Status: AC | PRN
  Administered 2023-03-18: 75 mL via INTRAVENOUS

## 2023-03-18 MED ORDER — FLUORESCEIN SODIUM 1 MG OP STRP
2.0000 | ORAL_STRIP | Freq: Once | OPHTHALMIC | Status: AC
  Administered 2023-03-18: 2 via OPHTHALMIC
  Filled 2023-03-18: qty 2
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
## 2023-03-18 NOTE — ED Provider Notes (Signed)
[place] EMERGENCY DEPARTMENT AT [facility] Provider Note   CSN: [id number] Arrival date & time: 03/18/23  8657     History  Chief Complaint  Patient presents with   Eye Problem    [name] is a 42 y.o. male, history of breast cancer, hypertension, coronary artery disease, schizophrenia, who presents to the ED secondary to vision loss over the last week.  He states about a week ago he started having a severe headache in the back of his head, and then progressively had left vision loss, he states he can see at the periphery but cannot see when looking straight forward.  States it has then progressed to the other eye as well, and he is having a difficulty time seeing anything looking at it.  Denies any trauma to the eye or the head.  Is not taking any opioids.  Denies any light sensitivity or nausea.  Denies any discharge from the eye, or acute pain to the eye.    Home Medications Prior to Admission medications   Medication Sig Start Date End Date Taking? Authorizing Provider  amLODipine (NORVASC) 5 MG tablet Take 1 tablet (5 mg total) by mouth daily. 01/18/23   Kathleen Lime, MD  atorvastatin (LIPITOR) 40 MG tablet Take 1 tablet (40 mg total) by mouth daily. 01/18/23   Kathleen Lime, MD  busPIRone (BUSPAR) 10 MG tablet Take 1 tablet (10 mg total) by mouth 3 (three) times daily. 01/07/23   Clapacs, Jackquline Denmark, MD  diclofenac Sodium (VOLTAREN) 1 % GEL Apply 2 g topically 4 (four) times daily. 01/18/23   Kathleen Lime, MD  divalproex (DEPAKOTE) 500 MG DR tablet Take 1 tablet (500 mg total) by mouth every 12 (twelve) hours. 01/07/23 03/08/23  Clapacs, Jackquline Denmark, MD  doxycycline (VIBRAMYCIN) 50 MG capsule Take 1 capsule (50 mg total) by mouth 2 (two) times daily. 02/10/23   Kathleen Lime, MD  gabapentin (NEURONTIN) 300 MG capsule Take 1 capsule (300 mg total) by mouth 2 (two) times daily AND 2 capsules (600 mg total) at bedtime. 03/16/23   Angelina Sheriff, DO  neomycin-bacitracin-polymyxin  [phone] OINT Apply 1 Application topically 2 (two) times daily. 01/07/23   Clapacs, Jackquline Denmark, MD  thiamine (VITAMIN B-1) 100 MG tablet Take 1 tablet (100 mg total) by mouth daily. 01/08/23   Clapacs, Jackquline Denmark, MD  traZODone (DESYREL) 100 MG tablet Take 1 tablet (100 mg total) by mouth at bedtime as needed for sleep. 01/07/23   Clapacs, Jackquline Denmark, MD      Allergies    Codeine, Penicillins, Morphine, Coconut (cocos nucifera), Grapefruit concentrate, Morphine and codeine, Oxycodone, and Norco [hydrocodone-acetaminophen]    Review of Systems   Review of Systems  Eyes:  Positive for pain.    Physical Exam Updated Vital Signs BP 116/84 (BP Location: Left Arm)   Pulse 88   Temp 98.1 F (36.7 C) (Oral)   Resp 16   Ht 5\' 6"  (1.676 m)   Wt 72.1 kg   SpO2 94%   BMI 25.66 kg/m  Physical Exam Vitals and nursing note reviewed.  Constitutional:      General: He is not in acute distress.    Appearance: He is well-developed.  HENT:     Head: Normocephalic and atraumatic.  Eyes:     General:        Right eye: No discharge.        Left eye: No discharge.     Intraocular pressure: Right eye pressure  is 38 mmHg. Left eye pressure is 38 mmHg.     Extraocular Movements: Extraocular movements intact.     Conjunctiva/sclera: Conjunctivae normal.     Pupils: Pupils are equal, round, and reactive to light.     Comments: Poor peripheral vision. Appropriate pupillary constriction. No pain with EOM.   Cardiovascular:     Rate and Rhythm: Normal rate and regular rhythm.     Heart sounds: No murmur heard. Pulmonary:     Effort: Pulmonary effort is normal. No respiratory distress.     Breath sounds: Normal breath sounds.  Abdominal:     Palpations: Abdomen is soft.     Tenderness: There is no abdominal tenderness.  Musculoskeletal:        General: No swelling.     Cervical back: Neck supple.  Skin:    General: Skin is warm and dry.     Capillary Refill: Capillary refill takes less than 2 seconds.   Neurological:     Mental Status: He is alert.  Psychiatric:        Mood and Affect: Mood normal.     ED Results / Procedures / Treatments   Labs (all labs ordered are listed, but only abnormal results are displayed) Labs Reviewed  COMPREHENSIVE METABOLIC PANEL - Abnormal; Notable for the following components:      Result Value   Total Protein 8.6 (*)    All other components within normal limits  CBC WITH DIFFERENTIAL/PLATELET    EKG None  Radiology CT Head W or Wo Contrast  Result Date: 03/18/2023 CLINICAL DATA:  Vision loss, binocular. Headache. History of cancer. EXAM: CT HEAD WITHOUT AND WITH CONTRAST TECHNIQUE: Contiguous axial images were obtained from the base of the skull through the vertex without and with intravenous contrast. RADIATION DOSE REDUCTION: This exam was performed according to the departmental dose-optimization program which includes automated exposure control, adjustment of the mA and/or kV according to patient size and/or use of iterative reconstruction technique. CONTRAST:  75mL OMNIPAQUE IOHEXOL 300 MG/ML  SOLN COMPARISON:  Head CT 11/17/2022. FINDINGS: Brain: No acute hemorrhage, mass effect or midline shift. Gray-white differentiation is preserved. No abnormal enhancement. No hydrocephalus or extra-axial collection. Basilar cisterns are patent. Vascular: No hyperdense vessel or unexpected calcification. Visible vessels are patent. Skull: No calvarial fracture or suspicious bone lesion. Skull base is unremarkable. Sinuses/Orbits: No acute finding. Other: None. IMPRESSION: No acute intracranial abnormality. No evidence of intracranial metastatic disease. Electronically Signed   By: Orvan Falconer M.D.   On: 03/18/2023 15:53    Procedures Procedures    Medications Ordered in ED Medications  fluorescein ophthalmic strip 2 strip (has no administration in time range)  tetracaine (PONTOCAINE) 0.5 % ophthalmic solution 1 drop (has no administration in time  range)  iohexol (OMNIPAQUE) 300 MG/ML solution 75 mL (75 mLs Intravenous Contrast Given 03/18/23 1510)    ED Course/ Medical Decision Making/ A&P                                 Medical Decision Making Patient is a 42 year old male, here for central loss of vision, has been going on for the past week.  Started in the left, then progressed to the right.  His eyes are not erythematous, no discharge on exam, extraocular movements intact.  Fluorescein exam negative.  Elevated pressures on exam.  Discussed with Dr. Genia Del, he will evaluate patient.  CT brain, ordered with and  without, to evaluate for any kind of mass causing vision changes  Amount and/or Complexity of Data Reviewed Labs: ordered.    Details: Unremarkable Radiology: ordered.    Details: No evidence of mass on CT Discussion of management or test interpretation with external provider(s): Discussed with Dr. Genia Del, he evaluated patient, recommends that he will need to follow-up in clinic, likely secondary just to need for glasses.  Discharged home with strict return precautions, and provided with Dr. Benard Rink information.  Risk Prescription drug management.    Final Clinical Impression(s) / ED Diagnoses Final diagnoses:  Vision changes    Rx / DC Orders ED Discharge Orders     None         Geovanny Sartin, Harley Alto, PA 03/18/23 1642    Rexford Maus, DO 03/18/23 1710

## 2023-03-18 NOTE — Discharge Instructions (Addendum)
Today your vision loss was evaluated, you should follow-up with Dr. Genia Del, and pursue getting glasses.  Return to the ER if you have any sudden vision loss or severe eye pain.

## 2023-03-18 NOTE — ED Notes (Signed)
 Could not get blood work

## 2023-03-18 NOTE — ED Triage Notes (Addendum)
Pt states for the past week pt has been slowly losing eyesight. Pt describes it as blackness in the center of their vision, present in both eyes, increased pain with bright light.

## 2023-03-24 ENCOUNTER — Ambulatory Visit: Payer: MEDICAID | Attending: Physical Medicine and Rehabilitation

## 2023-03-24 ENCOUNTER — Other Ambulatory Visit: Payer: Self-pay

## 2023-03-24 ENCOUNTER — Ambulatory Visit (HOSPITAL_COMMUNITY): Payer: Medicaid Other | Admitting: Psychiatry

## 2023-03-24 DIAGNOSIS — G8929 Other chronic pain: Secondary | ICD-10-CM | POA: Insufficient documentation

## 2023-03-24 DIAGNOSIS — M6281 Muscle weakness (generalized): Secondary | ICD-10-CM | POA: Insufficient documentation

## 2023-03-24 DIAGNOSIS — M25512 Pain in left shoulder: Secondary | ICD-10-CM | POA: Diagnosis present
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
## 2023-03-30 ENCOUNTER — Encounter: Payer: Self-pay | Admitting: Student

## 2023-03-30 ENCOUNTER — Ambulatory Visit: Payer: MEDICAID | Admitting: Student

## 2023-03-30 VITALS — BP 128/88 | HR 95 | Temp 98.0°F | Ht 66.0 in | Wt 161.0 lb

## 2023-03-30 DIAGNOSIS — M25512 Pain in left shoulder: Secondary | ICD-10-CM | POA: Diagnosis not present

## 2023-03-30 DIAGNOSIS — Z139 Encounter for screening, unspecified: Secondary | ICD-10-CM | POA: Insufficient documentation

## 2023-03-30 DIAGNOSIS — C50929 Malignant neoplasm of unspecified site of unspecified male breast: Secondary | ICD-10-CM

## 2023-03-30 DIAGNOSIS — G8929 Other chronic pain: Secondary | ICD-10-CM

## 2023-03-30 DIAGNOSIS — Z23 Encounter for immunization: Secondary | ICD-10-CM | POA: Insufficient documentation

## 2023-03-30 MED ORDER — HYDROCODONE-ACETAMINOPHEN 5-325 MG PO TABS: 1.0000 | ORAL_TABLET | Freq: Four times a day (QID) | ORAL | 0 refills | Status: DC | PRN
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
## 2023-03-30 NOTE — Assessment & Plan Note (Addendum)
Patient has a past medical history of breast cancer status post bilateral mastectomies.  There is some concern with outpatient having lymph nodes that have been increasing around his body.  Patient CT scan that revealed multiple soft tissue metastases and nodules concerning for metastatic cancer.  Will likely need biopsy.  Will initially start with PET/CT scan to see if these are metabolically active.  Afterwards can refer patient for biopsy if need be.  Did refer patient to oncology.  Patient did follow with [facility] in the past, but patient has not seen them in some time.  I did give patient information about the [facility] oncology clinic that he follow-up with and asked him to call.  Plan: -Referred to oncology -[facility] contact information given -Given Norco for cancer pain, of note, allergies does show patient has allergy to Norco, but patient adamantly stated that he is only allergic to morphine, and nothing else and he states that he will take Norco as he is not allergic to this

## 2023-03-30 NOTE — Assessment & Plan Note (Signed)
Patient presents to the clinic today to discuss housing paperwork.  Patient is followed by a case manager named Rosette Reveal who needs these papers signed by his doctor to ensure that the process gets finished.  Did refer patient to our social worker Marena Chancy to help with the FL2 forms.  Patient has an appointment tomorrow for this with [facility].  Plan: -Refer to [facility]

## 2023-03-30 NOTE — Patient Instructions (Addendum)
[name], Hoye you for allowing me to take part in your care today.  Here are your instructions.  1.  Regarding your housing, we have to redo the FL 2 form online.  I referred you to social work, they will call you once the form is filled out.  2.  I will refer you to orthopedics.  3.  I will refer you for PET scan.  4.  I will call you with the results of the PET scan.  5.  We have updated for flu vaccine today.  [facility]   [facility]      Dr. Harrell Gave, MD  [address]  [place], [place] [address]  [phone]     6. I have referred you to oncology to be seen for these nodules   7. Please do tylenol for your pain.   Thank you, Dr. Allena Katz  If you have any other questions please contact the internal medicine clinic at [phone] If it is after hours, please call the [facility] at [phone] and then ask the person who picks up for the resident on call.
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
## 2023-03-30 NOTE — Assessment & Plan Note (Signed)
Administered flu vaccine today.

## 2023-03-31 ENCOUNTER — Ambulatory Visit: Payer: MEDICAID | Admitting: Licensed Clinical Social Worker

## 2023-03-31 ENCOUNTER — Ambulatory Visit: Payer: MEDICAID

## 2023-03-31 DIAGNOSIS — Z139 Encounter for screening, unspecified: Secondary | ICD-10-CM

## 2023-03-31 DIAGNOSIS — G8929 Other chronic pain: Secondary | ICD-10-CM

## 2023-03-31 DIAGNOSIS — M6281 Muscle weakness (generalized): Secondary | ICD-10-CM

## 2023-03-31 DIAGNOSIS — M25512 Pain in left shoulder: Secondary | ICD-10-CM | POA: Diagnosis not present
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
## 2023-03-31 NOTE — BH Specialist Note (Signed)
[facility] contacted Leisa Lenz from [employer] [phone]) regarding patient placement. Ms. Anselm Lis is assisting with the placement process.  [facility] completed the FL2 form, designating "Rest Home" as the appropriate level of care. The form was submitted to PCP Dr. Ammie Dalton for signature and has been returned signed.  Per Ms. Enoch's request, the completed FL2 form will be left at the front desk for her to collect directly. [facility] contacted Ms. Anselm Lis and advise form is ready and completed.  Action Items: Leave signed FL2 form at front desk for Ms. Anselm Lis to pick up Follow up with Ms. Anselm Lis on placement progress if necessary Next Steps: Monitor patient's placement status and provide additional support as needed.   I connected with [name] and/or [name] Rozak's mother via  Telephone or Video Enabled Telemedicine Application  (Video is Caregility application) and verified that I am speaking with the correct person using two identifiers. Discussed confidentiality: Yes   I discussed the limitations of telemedicine and the availability of in person appointments.  Discussed there is a possibility of technology failure and discussed alternative modes of communication if that failure occurs.  I discussed that engaging in this telemedicine visit, they consent to the provision of behavioral healthcare and the services will be billed under their insurance.  Patient and/or legal guardian expressed understanding and consented to Telemedicine visit: Yes [facility] contacted patient on today. Patient advised of [facility] role and contact information. [facility] informed patient and patient mother of completed Fl2 form. Patient stated no additional questions or concerns.   Christen Butter, MSW, LCSW-A She/Her Behavioral Health Clinician [facility]  Internal Medicine Center Direct Dial:[phone]  Fax [phone] Main Office Phone: [phone] [address] [place] [place], [place] [address] Website: [facility] Internal Medicine  [facility]  [place], [place]  [place]

## 2023-03-31 NOTE — Addendum Note (Signed)
Addended by: Modena Slater on: 03/31/2023 04:59 PM   Modules accepted: Orders

## 2023-04-04 IMAGING — CT CT HEAD W/O CM
4 series · 16 of 47 positions shown, 18 images · non-contrast
Comparison: Prior head CT examinations 12/30/2017 and earlier.
Brain MRI 12/25/2014.

CLINICAL DATA: Head trauma, moderate/severe. Additional history
provided: Patient reports falling backwards and hitting head, 4
seizures today, history of seizures.

EXAM:
CT HEAD WITHOUT CONTRAST
TECHNIQUE: Contiguous axial images were obtained from the base of the skull
through the vertex without intravenous contrast.

[Series 3: head wo · axial · 0.41mm/px · z∈[+1126,+1246]mm · 7 of 33 slices shown, 9 images]
[im 5/33  brain]
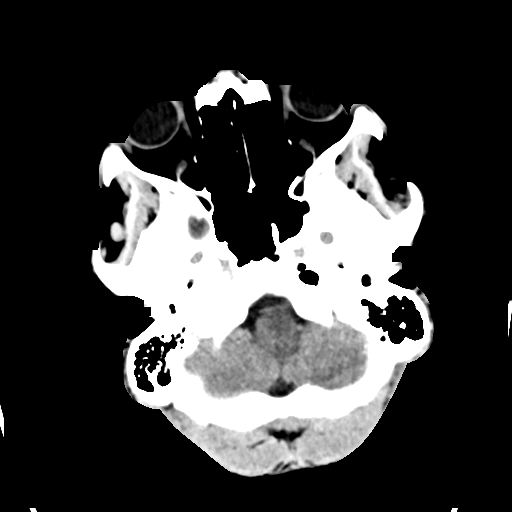
[im 5/33  bone]
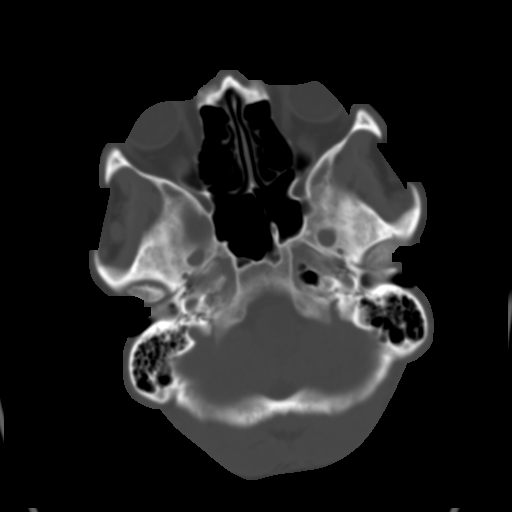
[im 9/33  brain]
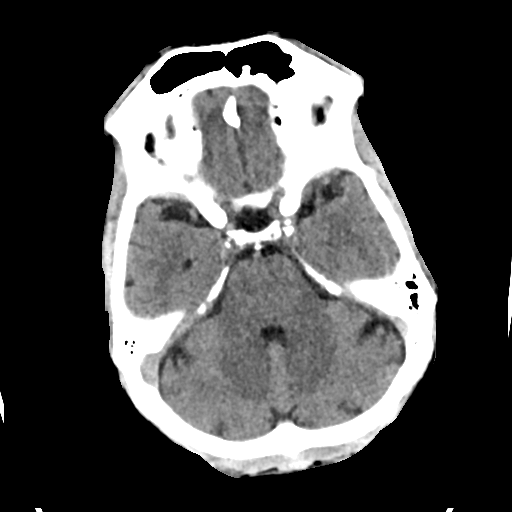
[im 13/33  brain]
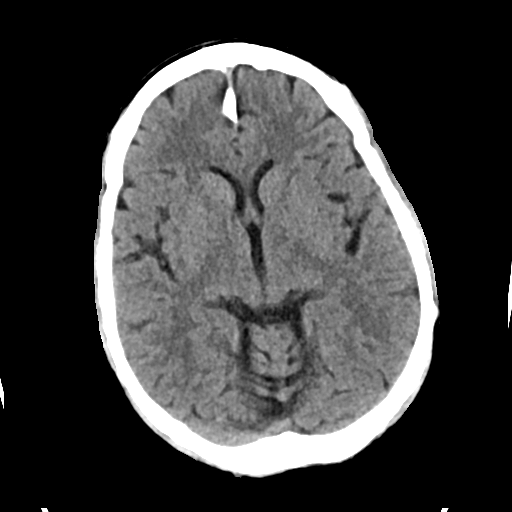
[im 17/33  brain]
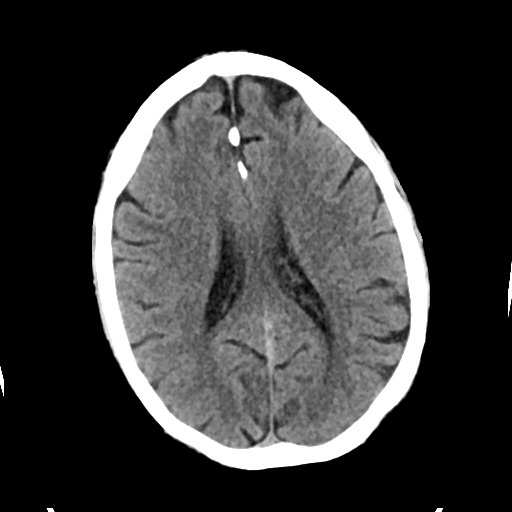
[im 21/33  brain]
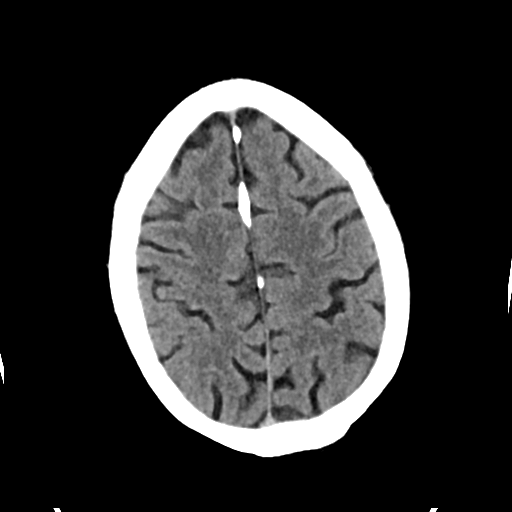
[im 21/33  bone]
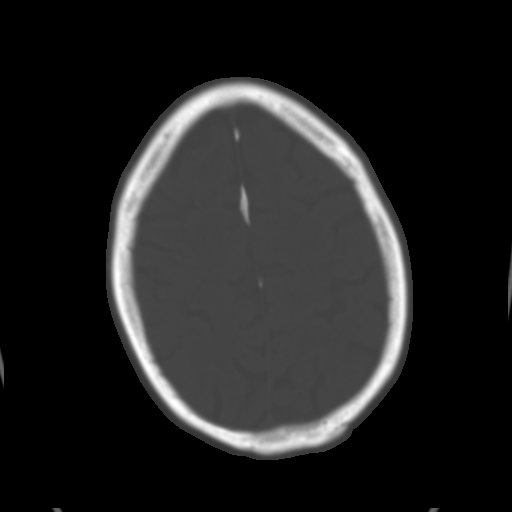
[im 25/33  brain]
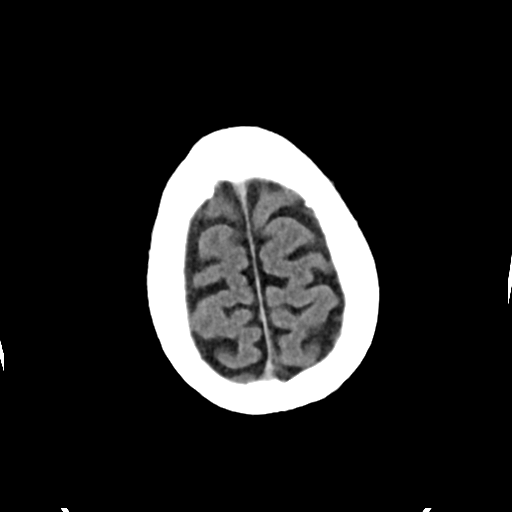
[im 29/33  brain]
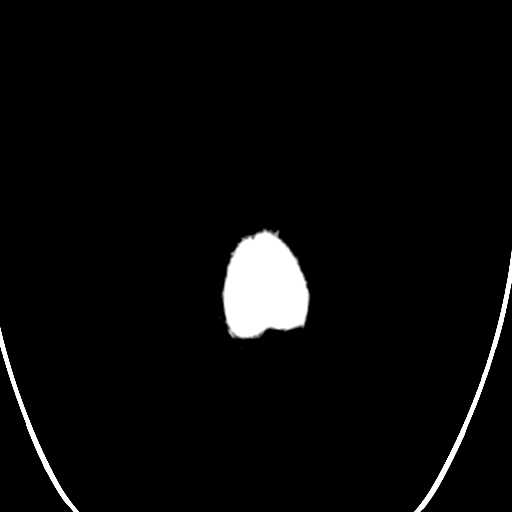

[Series 4: head bone · axial · 0.41mm/px · z∈[+1122,+1154]mm · 3 of 82 slices shown]
[im 9/82  bone]
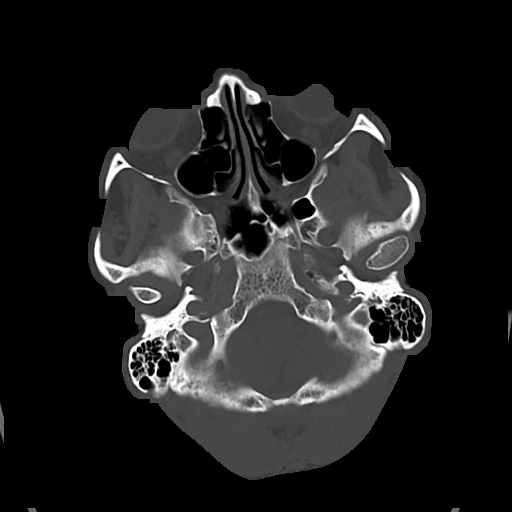
[im 17/82  bone]
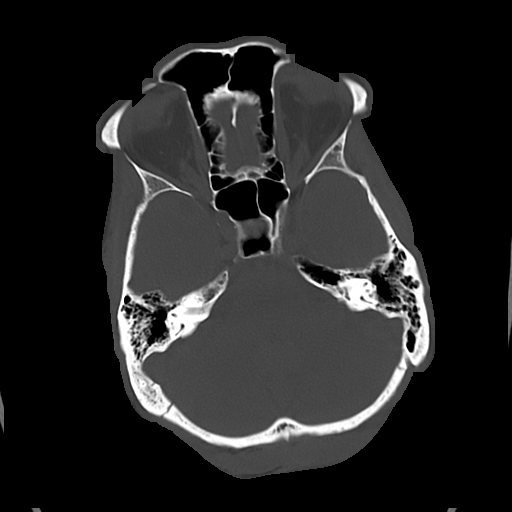
[im 25/82  bone]
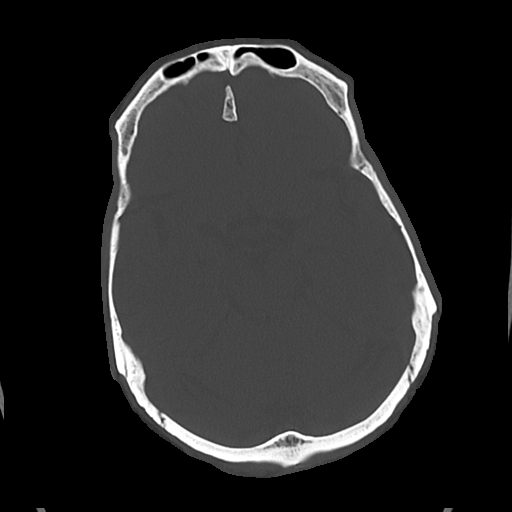

[Series 5: cor soft · coronal · 0.30mm/px · 3 of 66 slices shown]
[im 22/66  brain]
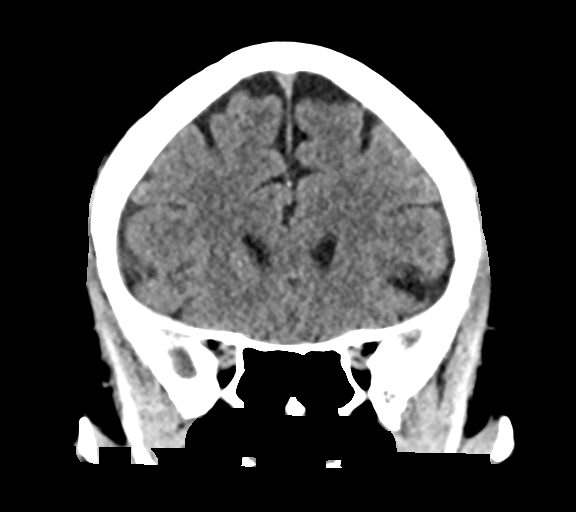
[im 29/66  brain]
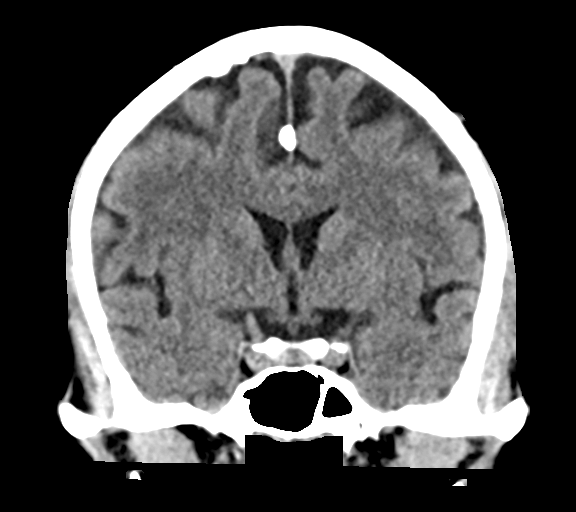
[im 37/66  brain]
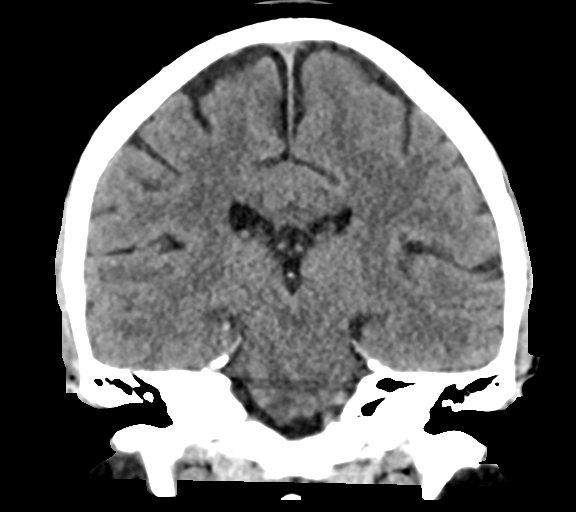

[Series 6: sag soft · sagittal · 0.29mm/px · 3 of 57 slices shown]
[im 19/57  brain]
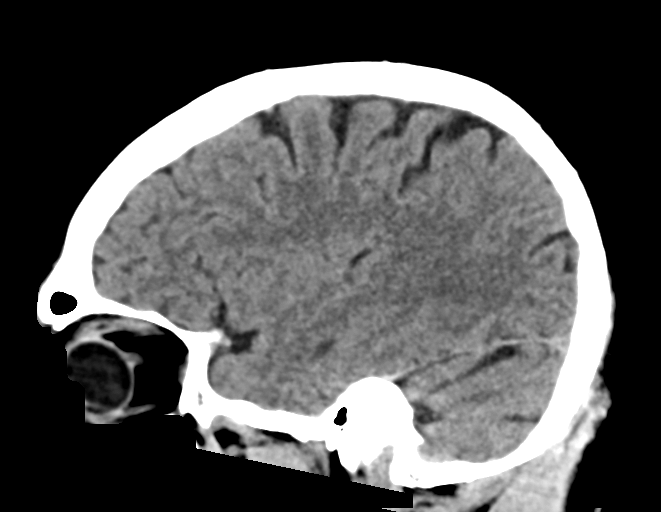
[im 29/57  brain]
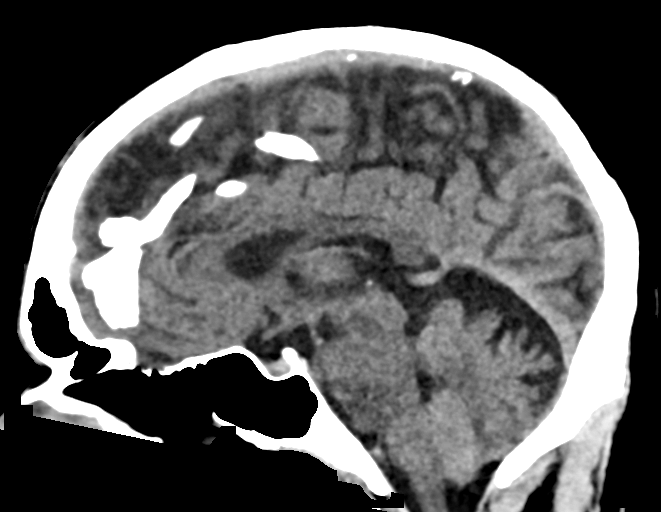
[im 38/57  brain]
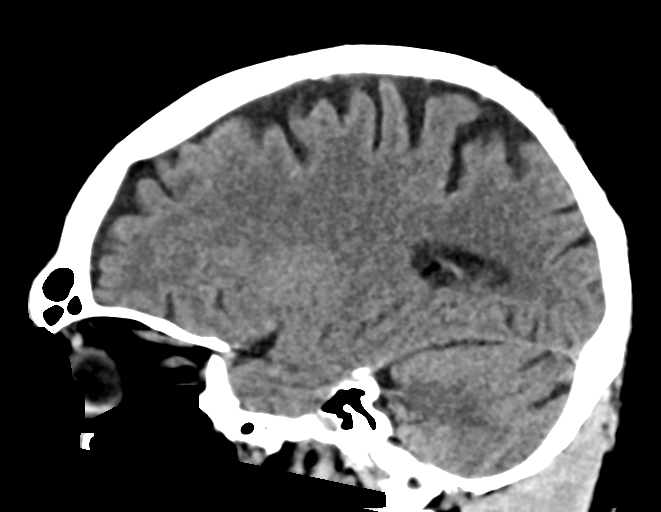

[16 of 47 positions shown; findings below may reference images not displayed]

FINDINGS: Brain:

Mild cerebral and cerebellar atrophy.

There is no acute intracranial hemorrhage.

No demarcated cortical infarct.

No extra-axial fluid collection.

No evidence of intracranial mass.

No midline shift.

Vascular: No hyperdense vessel.  Atherosclerotic calcifications

Skull: Normal. Negative for fracture or focal lesion.

Sinuses/Orbits: Visualized orbits show no acute finding. No
significant paranasal sinus disease at the imaged levels.
IMPRESSION: No evidence of acute intracranial abnormality.

Mild generalized parenchymal atrophy.

## 2023-04-04 NOTE — Progress Notes (Signed)
Internal Medicine Clinic Attending  Case discussed with the resident at the time of the visit.  We reviewed the resident's history and exam and pertinent patient test results.  I agree with the assessment, diagnosis, and plan of care documented in the resident's note.

## 2023-04-06 ENCOUNTER — Telehealth: Payer: MEDICAID | Admitting: Student

## 2023-04-06 ENCOUNTER — Ambulatory Visit: Payer: MEDICAID

## 2023-04-06 ENCOUNTER — Telehealth: Payer: Self-pay

## 2023-04-06 DIAGNOSIS — M25512 Pain in left shoulder: Secondary | ICD-10-CM | POA: Diagnosis not present

## 2023-04-06 DIAGNOSIS — G8929 Other chronic pain: Secondary | ICD-10-CM

## 2023-04-06 DIAGNOSIS — M6281 Muscle weakness (generalized): Secondary | ICD-10-CM

## 2023-04-06 NOTE — Telephone Encounter (Signed)
Pt is requesting a call back ... He stated he  went to  Physical Therapy   and is requesting a referral for surgery ( LEFT SHOULDER ) he stated that Physical Therapy  is not working

## 2023-04-06 NOTE — Telephone Encounter (Signed)
Ortho referral has already been ordered per Dr Allena Katz. Pt made aware. Telehealth appt canceled. Dr Allena Katz will talk to [facility], referral coordinator.

## 2023-04-06 NOTE — Telephone Encounter (Signed)
Return pt's call - stated he has been to PT x 4 with no improvement of his left shoulder. Pt stated this is noted in PT encounter, Pt is requesting an Ortho referral for surgery. Telehealth appt schedule today with Dr Allena Katz @ 1415 PM.
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
## 2023-04-08 ENCOUNTER — Ambulatory Visit: Payer: MEDICAID

## 2023-04-08 ENCOUNTER — Encounter: Payer: Self-pay | Admitting: Orthopaedic Surgery

## 2023-04-08 ENCOUNTER — Other Ambulatory Visit (INDEPENDENT_AMBULATORY_CARE_PROVIDER_SITE_OTHER): Payer: MEDICAID

## 2023-04-08 ENCOUNTER — Ambulatory Visit (INDEPENDENT_AMBULATORY_CARE_PROVIDER_SITE_OTHER): Payer: MEDICAID | Admitting: Orthopaedic Surgery

## 2023-04-08 DIAGNOSIS — M898X1 Other specified disorders of bone, shoulder: Secondary | ICD-10-CM
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
## 2023-04-08 NOTE — Progress Notes (Signed)
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 is a markedly complex case.  At this time would recommend consideration of a biopsy of the sclerotic lesion of the hips as well as the lymph nodes.  Will reach out to IR to see if they believe this is feasible.  In the event they are concerned about biopsy site target could recommend PET CT scan in order to better elucidate which areas may be of highest yield.  The patient voiced understanding of our findings and plan moving forward.   Ulysees Barns, MD Department of Hematology/Oncology [facility] Phone: [phone] Pager: [phone] Email: [email or web address]

## 2023-04-11 ENCOUNTER — Ambulatory Visit: Payer: MEDICAID

## 2023-04-11 ENCOUNTER — Encounter (HOSPITAL_BASED_OUTPATIENT_CLINIC_OR_DEPARTMENT_OTHER): Payer: Self-pay | Admitting: Orthopaedic Surgery

## 2023-04-11 ENCOUNTER — Inpatient Hospital Stay: Payer: MEDICAID | Attending: Physician Assistant | Admitting: Physician Assistant

## 2023-04-11 ENCOUNTER — Inpatient Hospital Stay: Payer: MEDICAID

## 2023-04-11 VITALS — BP 115/87 | HR 95 | Temp 98.5°F | Resp 16 | Wt 161.8 lb

## 2023-04-11 DIAGNOSIS — R591 Generalized enlarged lymph nodes: Secondary | ICD-10-CM | POA: Diagnosis present

## 2023-04-11 DIAGNOSIS — M899 Disorder of bone, unspecified: Secondary | ICD-10-CM

## 2023-04-11 LAB — CMP (CANCER CENTER ONLY)
ALT: 16 U/L (ref 0–44)
AST: 32 U/L (ref 15–41)
Albumin: 4 g/dL (ref 3.5–5.0)
Alkaline Phosphatase: 73 U/L (ref 38–126)
Anion gap: 7 (ref 5–15)
BUN: 10 mg/dL (ref 6–20)
CO2: 28 mmol/L (ref 22–32)
Calcium: 9.3 mg/dL (ref 8.9–10.3)
Chloride: 102 mmol/L (ref 98–111)
Creatinine: 0.83 mg/dL (ref 0.61–1.24)
GFR, Estimated: >60 mL/min (ref >60)
Glucose, Bld: 66 mg/dL — ABNORMAL LOW (ref 70–99)
Potassium: 3.9 mmol/L (ref 3.5–5.1)
Sodium: 137 mmol/L (ref 135–145)
Total Bilirubin: 0.4 mg/dL (ref 0.3–1.2)
Total Protein: 8.9 g/dL — ABNORMAL HIGH (ref 6.5–8.1)

## 2023-04-11 LAB — CBC WITH DIFFERENTIAL (CANCER CENTER ONLY)
Abs Immature Granulocytes: 0.01 10*3/uL (ref 0.00–0.07)
Basophils Absolute: 0 10*3/uL (ref 0.0–0.1)
Basophils Relative: 1 %
Eosinophils Absolute: 0.2 10*3/uL (ref 0.0–0.5)
Eosinophils Relative: 3 %
HCT: 42.7 % (ref 39.0–52.0)
Hemoglobin: 14.5 g/dL (ref 13.0–17.0)
Immature Granulocytes: 0 %
Lymphocytes Relative: 37 %
Lymphs Abs: 2.2 10*3/uL (ref 0.7–4.0)
MCH: 28.5 pg (ref 26.0–34.0)
MCHC: 34 g/dL (ref 30.0–36.0)
MCV: 84.1 fL (ref 80.0–100.0)
Monocytes Absolute: 0.5 10*3/uL (ref 0.1–1.0)
Monocytes Relative: 9 %
Neutro Abs: 3 10*3/uL (ref 1.7–7.7)
Neutrophils Relative %: 50 %
Platelet Count: 204 10*3/uL (ref 150–400)
RBC: 5.08 MIL/uL (ref 4.22–5.81)
RDW: 13 % (ref 11.5–15.5)
WBC Count: 6 10*3/uL (ref 4.0–10.5)
nRBC: 0 % (ref 0.0–0.2)

## 2023-04-11 LAB — HEPATITIS B SURFACE ANTIBODY,QUALITATIVE: Hep B S Ab: REACTIVE — AB

## 2023-04-11 LAB — HEPATITIS B CORE ANTIBODY, TOTAL: Hep B Core Total Ab: NONREACTIVE

## 2023-04-11 LAB — HIV ANTIBODY (ROUTINE TESTING W REFLEX): HIV Screen 4th Generation wRfx: NONREACTIVE

## 2023-04-11 LAB — SEDIMENTATION RATE: Sed Rate: 75 mm/hr — ABNORMAL HIGH (ref 0–16)

## 2023-04-11 LAB — LACTATE DEHYDROGENASE: LDH: 293 U/L — ABNORMAL HIGH (ref 98–192)

## 2023-04-11 LAB — C-REACTIVE PROTEIN: CRP: <0.5 mg/dL (ref <1.0)

## 2023-04-11 LAB — HEPATITIS C ANTIBODY: HCV Ab: NONREACTIVE

## 2023-04-11 LAB — HEPATITIS B SURFACE ANTIGEN: Hepatitis B Surface Ag: NONREACTIVE

## 2023-04-11 NOTE — Progress Notes (Unsigned)
Simonne Come, MD  Leodis Rains D; P Ir Procedure Requests No Bx.  Recommend PET CT.  Have ordering MD reach out to me via secure chat with questions or concerns.  Thx Vonna Kotyk

## 2023-04-11 NOTE — Patient Instructions (Signed)
Diagnostic Clinic Office Visit Discharge Information and Instructions  Thank you for choosing [facility] for your healthcare needs.  Below is a summary of today's discussion, along with our contact information and an outline of what to expect next.  Reason for Visit:  enlarged lymph nodes  Proposed Diagnostic Care Plan: Labs collected today Biopsy ordered for the bone lesion in your pelvis. The Interventional Radiology department will call you to schedule it. I am also asking them to review your chart to determine if a lymph node biopsy is necessary. If so they will likely do both biopsies the same day.  What to Expect: - Generally, when lab tests are ordered the results can take up to 1 week for results to be available.  At that point, we will contact you to discuss your results with you.  Unless there is a critical result, we will typically wait for all of your lab results to be available before contacting you. - If a biopsy is part of your Care Plan, those results can take on average 7-10 days to result.  Once results are available, we will contact you to discuss your pathology results and any next steps. - If you have additional imaging ordered, such as a CT Scan, MRI, Ultrasound, Bone Scan, or PET scan, your imaging will need to be authorized then scheduled with the earliest available appointment.  You may be asked to travel to another hospital within [facility] who has a sooner availability, please consider doing so if asked. - If you use MyChart, your results will be available to you in the MyChart portal.  Your provider will be in touch with you as soon as all of your results are available to be discussed.  Your Diagnostic Clinic Provider:  Namon Cirri PA-C and Dr. Leonides Schanz  If you or your caregiver have number blocking on your cell phones, please ensure the cancer center's numbers are not blocked.  If you are not a registered MyChart user, please consider enrolling in MyChart  to receive your test results and visit notes.  You can also access your discharge instructions electronically.  MyChart also gives you an electronic means to communicate with your Care Team instead of needing to call in to the cancer center.  We appreciate you trusting [place] with your healthcare and look forward to partnering with you as we work to uncover what your potential diagnosis may be.  Please do not hesitate to reach out at any point with questions or concerns.

## 2023-04-11 NOTE — Progress Notes (Signed)
Pt evaluated earlier today at [facility] for lymphadenopathy. He is a documented difficult airway with multiple medical and complex psychiatric and polysubstance abuse issues. Debbie at Dr Warren Danes office notified that left clavicle hardware removal surgery added on for this Wednesday will need to be postponed until oncology work up is complete, or this surgery will need to be moved to the Main OR.

## 2023-04-12 ENCOUNTER — Other Ambulatory Visit: Payer: MEDICAID

## 2023-04-12 LAB — SURGICAL PATHOLOGY

## 2023-04-13 ENCOUNTER — Encounter: Payer: Self-pay | Admitting: *Deleted

## 2023-04-13 ENCOUNTER — Ambulatory Visit: Payer: MEDICAID

## 2023-04-13 ENCOUNTER — Ambulatory Visit: Admit: 2023-04-13 | Payer: MEDICAID | Admitting: Orthopaedic Surgery

## 2023-04-13 HISTORY — DX: Failed or difficult intubation, initial encounter: T88.4XXA

## 2023-04-13 LAB — FLOW CYTOMETRY

## 2023-04-13 LAB — PROSTATE-SPECIFIC AG, SERUM (LABCORP): Prostate Specific Ag, Serum: 1.8 ng/mL (ref 0.0–4.0)

## 2023-04-13 SURGERY — HARDWARE REMOVAL
Anesthesia: General | Site: Shoulder | Laterality: Left

## 2023-04-18 ENCOUNTER — Encounter (HOSPITAL_COMMUNITY)
Admission: RE | Admit: 2023-04-18 | Discharge: 2023-04-18 | Disposition: A | Discharge status: Home / Self Care | Payer: MEDICAID | Source: Ambulatory Visit | Attending: Physician Assistant | Admitting: Physician Assistant

## 2023-04-18 ENCOUNTER — Ambulatory Visit: Payer: MEDICAID

## 2023-04-18 DIAGNOSIS — R591 Generalized enlarged lymph nodes: Secondary | ICD-10-CM | POA: Insufficient documentation

## 2023-04-18 DIAGNOSIS — M899 Disorder of bone, unspecified: Secondary | ICD-10-CM | POA: Diagnosis present

## 2023-04-18 LAB — GLUCOSE, CAPILLARY: Glucose-Capillary: 94 mg/dL (ref 70–99)

## 2023-04-18 MED ORDER — FLUDEOXYGLUCOSE F - 18 (FDG) INJECTION
8.0000 | Freq: Once | INTRAVENOUS | Status: AC | PRN
  Administered 2023-04-18: 8 via INTRAVENOUS

## 2023-04-19 ENCOUNTER — Other Ambulatory Visit (INDEPENDENT_AMBULATORY_CARE_PROVIDER_SITE_OTHER): Payer: MEDICAID

## 2023-04-19 ENCOUNTER — Ambulatory Visit (INDEPENDENT_AMBULATORY_CARE_PROVIDER_SITE_OTHER): Payer: MEDICAID | Admitting: Sports Medicine

## 2023-04-19 ENCOUNTER — Encounter (HOSPITAL_COMMUNITY): Payer: Self-pay

## 2023-04-19 ENCOUNTER — Other Ambulatory Visit: Payer: Self-pay | Admitting: Physician Assistant

## 2023-04-19 ENCOUNTER — Other Ambulatory Visit: Payer: Self-pay

## 2023-04-19 ENCOUNTER — Encounter: Payer: Self-pay | Admitting: Sports Medicine

## 2023-04-19 DIAGNOSIS — M25511 Pain in right shoulder: Secondary | ICD-10-CM | POA: Diagnosis not present

## 2023-04-19 DIAGNOSIS — R202 Paresthesia of skin: Secondary | ICD-10-CM | POA: Diagnosis not present

## 2023-04-19 DIAGNOSIS — G8929 Other chronic pain: Secondary | ICD-10-CM

## 2023-04-19 DIAGNOSIS — R200 Anesthesia of skin: Secondary | ICD-10-CM | POA: Diagnosis not present

## 2023-04-19 MED ORDER — METHYLPREDNISOLONE ACETATE 40 MG/ML IJ SUSP
40.0000 mg | INTRAMUSCULAR | Status: AC | PRN
Start: 2023-04-19 — End: 2023-04-19
  Administered 2023-04-19: 40 mg via INTRA_ARTICULAR

## 2023-04-19 MED ORDER — BUPIVACAINE HCL 0.25 % IJ SOLN
2.0000 mL | INTRAMUSCULAR | Status: AC | PRN
Start: 2023-04-19 — End: 2023-04-19
  Administered 2023-04-19: 2 mL via INTRA_ARTICULAR

## 2023-04-19 MED ORDER — HYDROCODONE-ACETAMINOPHEN 5-325 MG PO TABS: 1.0000 | ORAL_TABLET | Freq: Three times a day (TID) | ORAL | 0 refills | Status: DC | PRN

## 2023-04-19 MED ORDER — LIDOCAINE HCL 1 % IJ SOLN
2.0000 mL | INTRAMUSCULAR | Status: AC | PRN
Start: 2023-04-19 — End: 2023-04-19
  Administered 2023-04-19: 2 mL

## 2023-04-19 NOTE — Progress Notes (Signed)
Patient states they have been having pain in the right shoulder for about 3 years. Patient confirms numbness and tingling when sleeping on the right side, but also at other times during the day. Patient denies any specific injury to the shoulder.
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
## 2023-04-20 ENCOUNTER — Ambulatory Visit: Payer: MEDICAID

## 2023-04-20 ENCOUNTER — Encounter (HOSPITAL_COMMUNITY): Payer: Self-pay | Admitting: Orthopaedic Surgery

## 2023-04-20 NOTE — Progress Notes (Signed)
Bennie Dallas, MD sent to [name] S PROCEDURE / BIOPSY REVIEW Date: 04/20/23  Requested Biopsy site: lymph node Reason for request: lymphadenopathy - hx of breast and kidney ca Imaging review: Best seen on PET 04/18/23 - #[id number], 204/429  Decision: Approved Imaging modality to perform: Ultrasound Schedule with: No sedation / Local anesthetic Schedule for: Any VIR  Additional comments: None  Please contact me with questions, concerns, or if issue pertaining to this request arise.  Bennie Dallas, MD [facility]

## 2023-04-21 ENCOUNTER — Other Ambulatory Visit: Payer: Self-pay

## 2023-04-21 ENCOUNTER — Encounter (HOSPITAL_COMMUNITY): Payer: Self-pay | Admitting: Orthopaedic Surgery

## 2023-04-21 NOTE — Progress Notes (Signed)
PCP -  Cardiologist -   PPM/ICD - denies Device Orders - n/a Rep Notified - n/a  Chest x-ray - CHEST CT 03-12-23 EKG - 12-31-22 Stress Test - denies ECHO - denies Cardiac Cath - denies  CPAP - denies  DM - denies  Blood Thinner Instructions: denies Aspirin Instructions: n/a  ERAS Protcol - clear liquids until 1215  COVID TEST-n/a   Anesthesia review: yes  Patient verbally denies any shortness of breath, fever, cough and chest pain during phone call   -------------  SDW INSTRUCTIONS given:  Your procedure is scheduled on April 25, 2023.  Report to [facility] Main Entrance "A" at 12:45 PM., and check in at the Admitting office.  Call this number if you have problems the morning of surgery:  [phone]   Remember:  Do not eat after midnight the night before your surgery  You may drink clear liquids until 12:15 P. M. the morning of your surgery.   Clear liquids allowed are: Water, Non-Citrus Juices (without pulp), Carbonated Beverages, Clear Tea, Black Coffee Only, and Gatorade    Take these medicines the morning of surgery with A SIP OF WATER  amLODipine (NORVASC)  busPIRone (BUSPAR)  gabapentin (NEURONTIN)  hydrOXYzine (ATARAX)  lamoTRIgine (LAMICTAL)    IF NEEDED   As of today, STOP taking any Aspirin (unless otherwise instructed by your surgeon) Aleve, Naproxen, Ibuprofen, Motrin, Advil, Goody's, BC's, all herbal medications, fish oil, and all vitamins.                      Do not wear jewelry, make up, or nail polish            Do not wear lotions, powders, perfumes/colognes, or deodorant.            Do not shave 48 hours prior to surgery.  Men may shave face and neck.            Do not bring valuables to the hospital.            [facility] is not responsible for any belongings or valuables.  Do NOT Smoke (Tobacco/Vaping) 24 hours prior to your procedure If you use a CPAP at night, you may bring all equipment for your overnight stay.   Contacts,  glasses, dentures or bridgework may not be worn into surgery.      For patients admitted to the hospital, discharge time will be determined by your treatment team.   Patients discharged the day of surgery will not be allowed to drive home, and someone needs to stay with them for 24 hours.    Special instructions:   [facility]- Preparing For Surgery  Before surgery, you can play an important role. Because skin is not sterile, your skin needs to be as free of germs as possible. You can reduce the number of germs on your skin by washing with CHG (chlorahexidine gluconate) Soap before surgery.  CHG is an antiseptic cleaner which kills germs and bonds with the skin to continue killing germs even after washing.    Oral Hygiene is also important to reduce your risk of infection.  Remember - BRUSH YOUR TEETH THE MORNING OF SURGERY WITH YOUR REGULAR TOOTHPASTE  Please do not use if you have an allergy to CHG or antibacterial soaps. If your skin becomes reddened/irritated stop using the CHG.  Do not shave (including legs and underarms) for at least 48 hours prior to first CHG shower. It is OK to  shave your face.  Please follow these instructions carefully.   Shower the NIGHT BEFORE SURGERY and the MORNING OF SURGERY with DIAL Soap.   Pat yourself dry with a CLEAN TOWEL.  Wear CLEAN PAJAMAS to bed the night before surgery  Place CLEAN SHEETS on your bed the night of your first shower and DO NOT SLEEP WITH PETS.   Day of Surgery: Please shower morning of surgery  Wear Clean/Comfortable clothing the morning of surgery Do not apply any deodorants/lotions.   Remember to brush your teeth WITH YOUR REGULAR TOOTHPASTE.   Questions were answered. Patient verbalized understanding of instructions.
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
## 2023-04-21 NOTE — Anesthesia Preprocedure Evaluation (Addendum)
Anesthesia Evaluation  Patient identified by MRN, date of birth, ID band Patient awake    Reviewed: Allergy & Precautions, NPO status , [name] Chart, lab work & pertinent test results  History of Anesthesia Complications (+) DIFFICULT AIRWAY and history of anesthetic complications  Airway Mallampati: II  TM Distance: >3 FB Neck ROM: Full    Dental  (+) Edentulous Upper, Edentulous Lower   Pulmonary Current Smoker and Patient abstained from smoking.   Pulmonary exam normal        Cardiovascular hypertension, Pt. on medications + CAD and + Peripheral Vascular Disease  Normal cardiovascular exam     Neuro/Psych  Headaches, Seizures -, Well Controlled,  PSYCHIATRIC DISORDERS Anxiety Depression Bipolar Disorder Schizophrenia   Hx suicide attempt via OD with tylenol Personality disorder     GI/Hepatic negative GI ROS,,,(+) Cirrhosis     substance abuse  alcohol use, cocaine use and marijuana use Denies any substance use past 48 hrs    Endo/Other  negative endocrine ROS    Renal/GU Renal disease RCC s/p partial nephrectomy      Musculoskeletal negative musculoskeletal ROS (+)  narcotic dependent  Abdominal   Peds  Hematology negative hematology ROS (+)   Anesthesia Other Findings   Reproductive/Obstetrics                              Anesthesia Physical Anesthesia Plan  ASA: 3  Anesthesia Plan: General   Post-op Pain Management: Precedex   Induction: Intravenous  PONV Risk Score and Plan: 1 and Treatment may vary due to age or medical condition, Ondansetron and Midazolam  Airway Management Planned: Oral ETT and Video Laryngoscope Planned  Additional Equipment: None  Intra-op Plan:   Post-operative Plan: Extubation in OR  Informed Consent: I have reviewed the patients History and Physical, chart, labs and discussed the procedure including the risks, benefits and  alternatives for the proposed anesthesia with the patient or authorized representative who has indicated his/her understanding and acceptance.     Dental advisory given  Plan Discussed with: CRNA and Anesthesiologist  Anesthesia Plan Comments: (See PAT note )        Anesthesia Quick Evaluation

## 2023-04-22 MED ORDER — TRANEXAMIC ACID 1000 MG/10ML IV SOLN
2000.0000 mg | INTRAVENOUS | Status: DC
  Filled 2023-04-22: qty 20

## 2023-04-25 ENCOUNTER — Ambulatory Visit (HOSPITAL_COMMUNITY): Payer: MEDICAID | Admitting: Vascular Surgery

## 2023-04-25 ENCOUNTER — Encounter (HOSPITAL_COMMUNITY): Payer: Self-pay | Admitting: Orthopaedic Surgery

## 2023-04-25 ENCOUNTER — Ambulatory Visit (HOSPITAL_COMMUNITY): Payer: MEDICAID

## 2023-04-25 ENCOUNTER — Encounter (HOSPITAL_COMMUNITY)
Admission: RE | Disposition: A | Discharge status: Home / Self Care | Payer: Self-pay | Source: Home / Self Care | Attending: Orthopaedic Surgery

## 2023-04-25 ENCOUNTER — Ambulatory Visit (HOSPITAL_COMMUNITY)
Admission: RE | Admit: 2023-04-25 | Discharge: 2023-04-25 | Disposition: A | Discharge status: Home / Self Care | Payer: MEDICAID | Attending: Orthopaedic Surgery | Admitting: Orthopaedic Surgery

## 2023-04-25 ENCOUNTER — Other Ambulatory Visit: Payer: Self-pay

## 2023-04-25 ENCOUNTER — Ambulatory Visit (HOSPITAL_BASED_OUTPATIENT_CLINIC_OR_DEPARTMENT_OTHER): Payer: MEDICAID | Admitting: Vascular Surgery

## 2023-04-25 DIAGNOSIS — F419 Anxiety disorder, unspecified: Secondary | ICD-10-CM | POA: Insufficient documentation

## 2023-04-25 DIAGNOSIS — T8484XA Pain due to internal orthopedic prosthetic devices, implants and grafts, initial encounter: Secondary | ICD-10-CM | POA: Insufficient documentation

## 2023-04-25 DIAGNOSIS — I2510 Atherosclerotic heart disease of native coronary artery without angina pectoris: Secondary | ICD-10-CM | POA: Diagnosis not present

## 2023-04-25 DIAGNOSIS — Z85528 Personal history of other malignant neoplasm of kidney: Secondary | ICD-10-CM | POA: Insufficient documentation

## 2023-04-25 DIAGNOSIS — Z79891 Long term (current) use of opiate analgesic: Secondary | ICD-10-CM | POA: Diagnosis not present

## 2023-04-25 DIAGNOSIS — Z9013 Acquired absence of bilateral breasts and nipples: Secondary | ICD-10-CM | POA: Insufficient documentation

## 2023-04-25 DIAGNOSIS — F319 Bipolar disorder, unspecified: Secondary | ICD-10-CM | POA: Diagnosis not present

## 2023-04-25 DIAGNOSIS — Z4589 Encounter for adjustment and management of other implanted devices: Secondary | ICD-10-CM | POA: Diagnosis present

## 2023-04-25 DIAGNOSIS — Z8781 Personal history of (healed) traumatic fracture: Secondary | ICD-10-CM | POA: Insufficient documentation

## 2023-04-25 DIAGNOSIS — Z905 Acquired absence of kidney: Secondary | ICD-10-CM | POA: Insufficient documentation

## 2023-04-25 DIAGNOSIS — F17210 Nicotine dependence, cigarettes, uncomplicated: Secondary | ICD-10-CM | POA: Insufficient documentation

## 2023-04-25 DIAGNOSIS — I10 Essential (primary) hypertension: Secondary | ICD-10-CM | POA: Insufficient documentation

## 2023-04-25 DIAGNOSIS — E7800 Pure hypercholesterolemia, unspecified: Secondary | ICD-10-CM | POA: Diagnosis not present

## 2023-04-25 HISTORY — PX: HARDWARE REMOVAL: SHX979

## 2023-04-25 SURGERY — REMOVAL, HARDWARE
Anesthesia: General | Site: Shoulder | Laterality: Left

## 2023-04-25 MED ORDER — FENTANYL CITRATE (PF) 250 MCG/5ML IJ SOLN
INTRAMUSCULAR | Status: DC | PRN
  Administered 2023-04-25: 100 ug via INTRAVENOUS

## 2023-04-25 MED ORDER — FENTANYL CITRATE (PF) 250 MCG/5ML IJ SOLN
INTRAMUSCULAR | Status: AC
  Filled 2023-04-25: qty 5

## 2023-04-25 MED ORDER — LACTATED RINGERS IV SOLN: INTRAVENOUS | Status: DC

## 2023-04-25 MED ORDER — ROCURONIUM BROMIDE 10 MG/ML (PF) SYRINGE
PREFILLED_SYRINGE | INTRAVENOUS | Status: DC | PRN
  Administered 2023-04-25: 50 mg via INTRAVENOUS

## 2023-04-25 MED ORDER — DIPHENHYDRAMINE HCL 50 MG/ML IJ SOLN
INTRAMUSCULAR | Status: AC
  Filled 2023-04-25: qty 1

## 2023-04-25 MED ORDER — HYDROCODONE-ACETAMINOPHEN 5-325 MG PO TABS: 1.0000 | ORAL_TABLET | Freq: Four times a day (QID) | ORAL | 0 refills | Status: DC | PRN

## 2023-04-25 MED ORDER — AMISULPRIDE (ANTIEMETIC) 5 MG/2ML IV SOLN
INTRAVENOUS | Status: AC
  Filled 2023-04-25: qty 4

## 2023-04-25 MED ORDER — OXYCODONE HCL 5 MG/5ML PO SOLN: 5.0000 mg | Freq: Once | ORAL | Status: DC | PRN

## 2023-04-25 MED ORDER — DIPHENHYDRAMINE HCL 50 MG/ML IJ SOLN
12.5000 mg | Freq: Once | INTRAMUSCULAR | Status: AC
Start: 2023-04-25 — End: 2023-04-25
  Administered 2023-04-25: 12.5 mg via INTRAVENOUS

## 2023-04-25 MED ORDER — PROPOFOL 10 MG/ML IV BOLUS
INTRAVENOUS | Status: DC | PRN
  Administered 2023-04-25: 200 mg via INTRAVENOUS

## 2023-04-25 MED ORDER — ONDANSETRON HCL 4 MG/2ML IJ SOLN
INTRAMUSCULAR | Status: AC
  Filled 2023-04-25: qty 2

## 2023-04-25 MED ORDER — MIDAZOLAM HCL 2 MG/2ML IJ SOLN
INTRAMUSCULAR | Status: DC | PRN
  Administered 2023-04-25: 2 mg via INTRAVENOUS

## 2023-04-25 MED ORDER — FENTANYL CITRATE (PF) 100 MCG/2ML IJ SOLN: 25.0000 ug | INTRAMUSCULAR | Status: DC | PRN

## 2023-04-25 MED ORDER — DEXAMETHASONE SODIUM PHOSPHATE 10 MG/ML IJ SOLN
INTRAMUSCULAR | Status: AC
  Filled 2023-04-25: qty 1

## 2023-04-25 MED ORDER — BUPIVACAINE-EPINEPHRINE 0.5% -1:200000 IJ SOLN
INTRAMUSCULAR | Status: DC | PRN
Start: 2023-04-25 — End: 2023-04-25
  Administered 2023-04-25: 10 mL

## 2023-04-25 MED ORDER — ONDANSETRON HCL 4 MG PO TABS
4.0000 mg | ORAL_TABLET | Freq: Three times a day (TID) | ORAL | 0 refills | Status: DC | PRN
Start: 2023-04-25 — End: 2023-08-12

## 2023-04-25 MED ORDER — LIDOCAINE 2% (20 MG/ML) 5 ML SYRINGE
INTRAMUSCULAR | Status: DC | PRN
  Administered 2023-04-25: 60 mg via INTRAVENOUS

## 2023-04-25 MED ORDER — DEXMEDETOMIDINE HCL IN NACL 80 MCG/20ML IV SOLN
INTRAVENOUS | Status: DC | PRN
  Administered 2023-04-25: 12 ug via INTRAVENOUS

## 2023-04-25 MED ORDER — MIDAZOLAM HCL 2 MG/2ML IJ SOLN
INTRAMUSCULAR | Status: AC
  Filled 2023-04-25: qty 2

## 2023-04-25 MED ORDER — LIDOCAINE 2% (20 MG/ML) 5 ML SYRINGE
INTRAMUSCULAR | Status: AC
  Filled 2023-04-25: qty 5

## 2023-04-25 MED ORDER — ONDANSETRON HCL 4 MG/2ML IJ SOLN: 4.0000 mg | Freq: Once | INTRAMUSCULAR | Status: DC | PRN

## 2023-04-25 MED ORDER — BUPIVACAINE-EPINEPHRINE (PF) 0.25% -1:200000 IJ SOLN
INTRAMUSCULAR | Status: AC
  Filled 2023-04-25: qty 30

## 2023-04-25 MED ORDER — DEXMEDETOMIDINE HCL IN NACL 80 MCG/20ML IV SOLN
INTRAVENOUS | Status: AC
  Filled 2023-04-25: qty 20

## 2023-04-25 MED ORDER — OXYCODONE HCL 5 MG PO TABS: 5.0000 mg | ORAL_TABLET | Freq: Once | ORAL | Status: DC | PRN

## 2023-04-25 MED ORDER — SUGAMMADEX SODIUM 200 MG/2ML IV SOLN
INTRAVENOUS | Status: DC | PRN
  Administered 2023-04-25: 200 mg via INTRAVENOUS

## 2023-04-25 MED ORDER — FENTANYL CITRATE (PF) 100 MCG/2ML IJ SOLN
INTRAMUSCULAR | Status: AC
  Filled 2023-04-25: qty 2

## 2023-04-25 MED ORDER — ROCURONIUM BROMIDE 10 MG/ML (PF) SYRINGE
PREFILLED_SYRINGE | INTRAVENOUS | Status: AC
  Filled 2023-04-25: qty 10

## 2023-04-25 MED ORDER — 0.9 % SODIUM CHLORIDE (POUR BTL) OPTIME
TOPICAL | Status: DC | PRN
Start: 2023-04-25 — End: 2023-04-25
  Administered 2023-04-25: 1000 mL

## 2023-04-25 MED ORDER — DEXAMETHASONE SODIUM PHOSPHATE 10 MG/ML IJ SOLN
INTRAMUSCULAR | Status: DC | PRN
  Administered 2023-04-25: 10 mg via INTRAVENOUS

## 2023-04-25 MED ORDER — CHLORHEXIDINE GLUCONATE 0.12 % MT SOLN
15.0000 mL | OROMUCOSAL | Status: AC
  Administered 2023-04-25: 15 mL via OROMUCOSAL
  Filled 2023-04-25 (×2): qty 15

## 2023-04-25 MED ORDER — ONDANSETRON HCL 4 MG/2ML IJ SOLN
INTRAMUSCULAR | Status: DC | PRN
  Administered 2023-04-25: 4 mg via INTRAVENOUS

## 2023-04-25 MED ORDER — DIPHENHYDRAMINE HCL 12.5 MG/5ML PO LIQD
12.5000 mg | Freq: Once | ORAL | Status: DC
Start: 2023-04-25 — End: 2023-04-25

## 2023-04-25 MED ORDER — CEFAZOLIN SODIUM-DEXTROSE 2-4 GM/100ML-% IV SOLN
2.0000 g | INTRAVENOUS | Status: AC
  Administered 2023-04-25: 2 g via INTRAVENOUS
  Filled 2023-04-25: qty 100

## 2023-04-25 MED ORDER — TRANEXAMIC ACID-NACL 1000-0.7 MG/100ML-% IV SOLN
1000.0000 mg | INTRAVENOUS | Status: AC
  Administered 2023-04-25: 1000 mg via INTRAVENOUS
  Filled 2023-04-25: qty 100

## 2023-04-25 MED ORDER — PROPOFOL 10 MG/ML IV BOLUS
INTRAVENOUS | Status: AC
  Filled 2023-04-25: qty 20

## 2023-04-25 MED ORDER — AMISULPRIDE (ANTIEMETIC) 5 MG/2ML IV SOLN
10.0000 mg | Freq: Once | INTRAVENOUS | Status: AC | PRN
  Administered 2023-04-25: 10 mg via INTRAVENOUS

## 2023-04-25 SURGICAL SUPPLY — 41 items
AID PSTN UNV HD RSTRNT DISP (MISCELLANEOUS) ×1
BAG COUNTER SPONGE SURGICOUNT (BAG) ×1 IMPLANT
BAG SPNG CNTER NS LX DISP (BAG) ×1
COVER SURGICAL LIGHT HANDLE (MISCELLANEOUS) ×1 IMPLANT
DRAPE C-ARM 42X72 X-RAY (DRAPES) ×1 IMPLANT
DRAPE U-SHAPE 47X51 STRL (DRAPES) ×1 IMPLANT
DRAPE UNIVERSAL PACK (DRAPES) ×1 IMPLANT
DRSG TEGADERM 4X4.75 (GAUZE/BANDAGES/DRESSINGS) ×2 IMPLANT
DURAPREP 26ML APPLICATOR (WOUND CARE) ×2 IMPLANT
ELECT CAUTERY BLADE 6.4 (BLADE) ×1 IMPLANT
ELECT REM PT RETURN 9FT ADLT (ELECTROSURGICAL) ×1
ELECTRODE REM PT RTRN 9FT ADLT (ELECTROSURGICAL) ×1 IMPLANT
GAUZE SPONGE 4X4 12PLY STRL (GAUZE/BANDAGES/DRESSINGS) IMPLANT
GAUZE XEROFORM 1X8 LF (GAUZE/BANDAGES/DRESSINGS) ×1 IMPLANT
GLOVE BIOGEL PI IND STRL 7.0 (GLOVE) ×2 IMPLANT
GLOVE BIOGEL PI IND STRL 7.5 (GLOVE) ×1 IMPLANT
GLOVE ECLIPSE 7.0 STRL STRAW (GLOVE) ×1 IMPLANT
GLOVE SKINSENSE STRL SZ7.5 (GLOVE) ×2 IMPLANT
GLOVE SURG SYN 7.5 E (GLOVE) ×2
GLOVE SURG SYN 7.5 PF PI (GLOVE) ×2 IMPLANT
GLOVE SURG UNDER POLY LF SZ7 (GLOVE) ×19 IMPLANT
GLOVE SURG UNDER POLY LF SZ7.5 (GLOVE) ×4 IMPLANT
GOWN STRL SURGICAL XL XLNG (GOWN DISPOSABLE) ×2 IMPLANT
KIT BASIN OR (CUSTOM PROCEDURE TRAY) ×1 IMPLANT
KIT TURNOVER KIT B (KITS) ×1 IMPLANT
MANIFOLD NEPTUNE II (INSTRUMENTS) ×1 IMPLANT
NDL HYPO 25GX1X1/2 BEV (NEEDLE) IMPLANT
NEEDLE HYPO 25GX1X1/2 BEV (NEEDLE)
NS IRRIG 1000ML POUR BTL (IV SOLUTION) ×1 IMPLANT
PACK SHOULDER (CUSTOM PROCEDURE TRAY) ×1 IMPLANT
PAD ARMBOARD 7.5X6 YLW CONV (MISCELLANEOUS) ×2 IMPLANT
RESTRAINT HEAD UNIVERSAL NS (MISCELLANEOUS) IMPLANT
STRIP CLOSURE SKIN 1/2X4 (GAUZE/BANDAGES/DRESSINGS) ×1 IMPLANT
SUCTION TUBE FRAZIER 10FR DISP (SUCTIONS) ×1 IMPLANT
SUT ETHILON 3 0 PS 1 (SUTURE) IMPLANT
SUT MNCRL AB 4-0 PS2 18 (SUTURE) ×1 IMPLANT
SUT VIC AB 2-0 CT1 (SUTURE) IMPLANT
SUT VIC AB 2-0 CT1 27 (SUTURE) ×1
SUT VIC AB 2-0 CT1 TAPERPNT 27 (SUTURE) ×1 IMPLANT
SYR CONTROL 10ML LL (SYRINGE) IMPLANT
UNDERPAD 30X36 HEAVY ABSORB (UNDERPADS AND DIAPERS) ×1 IMPLANT
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
## 2023-04-25 NOTE — Op Note (Signed)
   Date of Surgery: 04/25/2023  INDICATIONS: Ms. Weightman is a 42 y.o.-year-old adult with symptomatic hardware of the left clavicle status post prior ORIF in 2019.  The patient did consent to the procedure after discussion of the risks and benefits.  PREOPERATIVE DIAGNOSIS: Symptomatic hardware of left clavicle  POSTOPERATIVE DIAGNOSIS: Same.  PROCEDURE: Removal of left clavicle plate, 3 screws and tight rope  SURGEON: N. Glee Arvin, M.D.  ASSIST: Starlyn Skeans [place], [place]; necessary for the timely completion of procedure and due to complexity of procedure.  ANESTHESIA:  general, local  IV FLUIDS AND URINE: See anesthesia.  ESTIMATED BLOOD LOSS: 50 mL.  EXPLANTS: Arthrex distal clavicle plate, 3 screws, tight rope button  DRAINS: None  COMPLICATIONS: see description of procedure.  DESCRIPTION OF PROCEDURE: The patient was brought to the operating room.  The patient had been signed prior to the procedure and this was documented. The patient had the anesthesia placed by the anesthesiologist.  A time-out was performed to confirm that this was the correct patient, site, side and location. The patient did receive antibiotics prior to the incision and was re-dosed during the procedure as needed at indicated intervals.  He was placed in the beach chair position.  The patient had the operative extremity prepped and draped in the standard surgical fashion.    The previous surgical scar was incised directly on top of the palpable hardware with a 10 blade.  Full-thickness flaps were sharply elevated off of the underlying hardware.  Rondure was used to remove the bony overgrowth that surrounded the plate.  Bovie cautery was used to achieve hemostasis.  Once we had the entire plate exposed we backed out the 3 screws without any difficulty.  A periosteal elevator was used to gently elevate the plate off of the clavicle.  The 2 sutures of the tight rope were cut sharply and flush with the clavicle.  The  previous clavicle fracture had completely healed.  The screw holes were rongeured down flush to the clavicle.  The surgical site was then thoroughly irrigated and closed in layered fashion using 2-0 Vicryl and 3-0 nylon.  Local with epi was placed in the subcutaneous tissue.  Sterile dressings were applied.  Patient tolerated the procedure well had no any complications.  Tessa Lerner was necessary for opening, closing, retracting, limb positioning and overall facilitation and timely completion of the procedure.  POSTOPERATIVE PLAN: Patient will be discharged home.  He can use the sling for comfort.  Activity as tolerated.  Weight-bear as tolerated.  Follow-up in the office in 2 weeks for suture removal.  N. Glee Arvin, MD 3:01 PM

## 2023-04-25 NOTE — Anesthesia Postprocedure Evaluation (Signed)
Anesthesia Post Note  Patient: [name]  Procedure(s) Performed: LEFT CLAVICLE HARDWARE REMOVAL (Left: Shoulder)     Patient location during evaluation: PACU Anesthesia Type: General Level of consciousness: awake and alert Pain management: pain level controlled Vital Signs Assessment: post-procedure vital signs reviewed and stable Respiratory status: spontaneous breathing, nonlabored ventilation and respiratory function stable Cardiovascular status: stable and blood pressure returned to baseline Anesthetic complications: no   No notable events documented.  Last Vitals:  Vitals:   04/25/23 1615 04/25/23 1630  BP: (!) 151/84 (!) 141/85  Pulse: 93 93  Resp: 18 15  Temp:  (!) 36.1 C  SpO2: 95% 92%                    Beryle Lathe

## 2023-04-25 NOTE — Transfer of Care (Signed)
Immediate Anesthesia Transfer of Care Note  Patient: [name]  Procedure(s) Performed: LEFT CLAVICLE HARDWARE REMOVAL (Left: Shoulder)  Patient Location: PACU  Anesthesia Type:General  Level of Consciousness: awake, alert , and oriented  Airway & Oxygen Therapy: Patient Spontanous Breathing  Post-op Assessment: Report given to RN and Post -op Vital signs reviewed and stable  Post vital signs: Reviewed and stable  Last Vitals:  Vitals Value Taken Time  BP 164/79 04/25/23 1538  Temp    Pulse 94 04/25/23 1541  Resp 15 04/25/23 1541  SpO2 90 % 04/25/23 1541  Vitals shown include unfiled device data.  Last Pain:  Vitals:   04/25/23 1308  TempSrc:   PainSc: 7       Patients Stated Pain Goal: 5 (04/25/23 1308)  Complications: No notable events documented.

## 2023-04-25 NOTE — Progress Notes (Signed)
Patient alert and oriented x4. Patient states he makes his own legal decisions and does not have a legal guardian.

## 2023-04-25 NOTE — Anesthesia Procedure Notes (Signed)
Procedure Name: Intubation Date/Time: 04/25/2023 2:16 PM  Performed by: Georgianne Fick D, CRNAPre-anesthesia Checklist: Patient identified, Emergency Drugs available, Suction available and Patient being monitored Patient Re-evaluated:Patient Re-evaluated prior to induction Oxygen Delivery Method: Circle System Utilized Preoxygenation: Pre-oxygenation with 100% oxygen Induction Type: IV induction Ventilation: Mask ventilation without difficulty Laryngoscope Size: Miller and 3 Grade View: Grade I Tube type: Oral Tube size: 7.5 mm Number of attempts: 1 Airway Equipment and Method: Stylet and Oral airway (GS available in the room per history of difficult intubation. No complication with DL) Placement Confirmation: ETT inserted through vocal cords under direct vision, positive ETCO2 and breath sounds checked- equal and bilateral Secured at: 22 cm Tube secured with: Tape Dental Injury: Teeth and Oropharynx as per pre-operative assessment

## 2023-04-25 NOTE — Discharge Instructions (Signed)
   Postoperative instructions:  Weightbearing instructions: as tolerated to left arm  Keep your dressing and/or splint clean and dry at all times.  You can remove your dressing on post-operative day #3 and change with a dry/sterile dressing or Band-Aids as needed thereafter.    Incision instructions:  Do not soak your incision for 3 weeks after surgery.  If the incision gets wet, pat dry and do not scrub the incision.  Pain control:  You have been given a prescription to be taken as directed for post-operative pain control.  In addition, elevate the operative extremity above the heart at all times to prevent swelling and throbbing pain.  Take over-the-counter Colace, 100mg  by mouth twice a day while taking narcotic pain medications to help prevent constipation.  Follow up appointments: 1) 14 days for suture removal and wound check.  -------------------------------------------------------------------------------------------------------------  After Surgery Pain Control:  After your surgery, post-surgical discomfort or pain is likely. This discomfort can last several days to a few weeks. At certain times of the day your discomfort may be more intense.  Did you receive a nerve block?  A nerve block can provide pain relief for one hour to two days after your surgery. As long as the nerve block is working, you will experience little or no sensation in the area the surgeon operated on.  As the nerve block wears off, you will begin to experience pain or discomfort. It is very important that you begin taking your prescribed pain medication before the nerve block fully wears off. Treating your pain at the first sign of the block wearing off will ensure your pain is better controlled and more tolerable when full-sensation returns. Do not wait until the pain is intolerable, as the medicine will be less effective. It is better to treat pain in advance than to try and catch up.  General Anesthesia:  If  you did not receive a nerve block during your surgery, you will need to start taking your pain medication shortly after your surgery and should continue to do so as prescribed by your surgeon.  Pain Medication:  Most commonly we prescribe Vicodin and Percocet for post-operative pain. Both of these medications contain a combination of acetaminophen (Tylenol) and a narcotic to help control pain.   It takes between 30 and 45 minutes before pain medication starts to work. It is important to take your medication before your pain level gets too intense.   Nausea is a common side effect of many pain medications. You will want to eat something before taking your pain medicine to help prevent nausea.   If you are taking a prescription pain medication that contains acetaminophen, we recommend that you do not take additional over the counter acetaminophen (Tylenol).  Other pain relieving options:   Using a cold pack to ice the affected area a few times a day (15 to 20 minutes at a time) can help to relieve pain, reduce swelling and bruising.   Elevation of the affected area can also help to reduce pain and swelling.

## 2023-04-26 ENCOUNTER — Encounter (HOSPITAL_COMMUNITY): Payer: Self-pay | Admitting: Orthopaedic Surgery

## 2023-04-26 ENCOUNTER — Other Ambulatory Visit: Payer: Self-pay | Admitting: Radiology

## 2023-04-26 DIAGNOSIS — M899 Disorder of bone, unspecified: Secondary | ICD-10-CM

## 2023-04-26 NOTE — Progress Notes (Signed)
Patient for US guided Core LN Biopsy on Wed 04/27/2023, I called and spoke with the patient on the phone and gave pre-procedure instructions. Pt was made aware to be here at 12:30p and check in at the [facility]. Pt stated understanding. Called 04/26/2023

## 2023-04-27 ENCOUNTER — Ambulatory Visit
Admission: RE | Admit: 2023-04-27 | Discharge: 2023-04-27 | Disposition: A | Discharge status: Home / Self Care | Payer: MEDICAID | Source: Ambulatory Visit | Attending: Physician Assistant | Admitting: Physician Assistant

## 2023-04-27 ENCOUNTER — Encounter: Payer: MEDICAID | Admitting: Orthopaedic Surgery

## 2023-04-27 DIAGNOSIS — Z85528 Personal history of other malignant neoplasm of kidney: Secondary | ICD-10-CM | POA: Insufficient documentation

## 2023-04-27 DIAGNOSIS — I889 Nonspecific lymphadenitis, unspecified: Secondary | ICD-10-CM | POA: Insufficient documentation

## 2023-04-27 DIAGNOSIS — M899 Disorder of bone, unspecified: Secondary | ICD-10-CM | POA: Insufficient documentation

## 2023-04-27 DIAGNOSIS — C8590 Non-Hodgkin lymphoma, unspecified, unspecified site: Secondary | ICD-10-CM | POA: Insufficient documentation

## 2023-04-27 MED ORDER — LIDOCAINE HCL (PF) 1 % IJ SOLN
10.0000 mL | Freq: Once | INTRAMUSCULAR | Status: AC
  Administered 2023-04-27: 10 mL via INTRADERMAL
  Filled 2023-04-27: qty 10

## 2023-04-27 NOTE — Procedures (Signed)
Interventional Radiology Procedure Note  Procedure: US guided biopsy of right superficial inguinal LN  Complications: None  Estimated Blood Loss: None  Recommendations: - DC Home   Signed,  Sterling Big, MD

## 2023-05-02 ENCOUNTER — Encounter: Payer: MEDICAID | Admitting: Student

## 2023-05-02 LAB — SURGICAL PATHOLOGY

## 2023-05-05 ENCOUNTER — Telehealth: Payer: Self-pay | Admitting: Physician Assistant

## 2023-05-05 DIAGNOSIS — R591 Generalized enlarged lymph nodes: Secondary | ICD-10-CM

## 2023-05-05 NOTE — Telephone Encounter (Signed)
I notified [name] by phone regarding lymph node biopsy results. Results are suggestive of a reactive process. There are no features suggestive of renal cell carcinoma. Plan is to repeat CT chest abdomen pelvis in 3 months to reassess lymphadenopathy. All of patient's questions were answered and patient expressed understanding of the plan provided.

## 2023-05-06 ENCOUNTER — Encounter (HOSPITAL_COMMUNITY): Payer: Self-pay | Admitting: Orthopaedic Surgery

## 2023-05-06 NOTE — Addendum Note (Signed)
Addendum  created 05/06/23 2314 by Beryle Lathe, MD   Intraprocedure Event edited, Intraprocedure Staff edited

## 2023-05-10 ENCOUNTER — Encounter: Payer: Self-pay | Admitting: Physician Assistant

## 2023-05-10 ENCOUNTER — Ambulatory Visit (INDEPENDENT_AMBULATORY_CARE_PROVIDER_SITE_OTHER): Payer: MEDICAID | Admitting: Physician Assistant

## 2023-05-10 ENCOUNTER — Other Ambulatory Visit (INDEPENDENT_AMBULATORY_CARE_PROVIDER_SITE_OTHER): Payer: MEDICAID

## 2023-05-10 DIAGNOSIS — Z9889 Other specified postprocedural states: Secondary | ICD-10-CM

## 2023-05-10 DIAGNOSIS — M898X1 Other specified disorders of bone, shoulder: Secondary | ICD-10-CM | POA: Diagnosis not present

## 2023-05-10 MED ORDER — METHOCARBAMOL 750 MG PO TABS: 750.0000 mg | ORAL_TABLET | Freq: Two times a day (BID) | ORAL | 0 refills | Status: DC | PRN

## 2023-05-10 MED ORDER — HYDROCODONE-ACETAMINOPHEN 7.5-325 MG PO TABS: 1.0000 | ORAL_TABLET | Freq: Three times a day (TID) | ORAL | 0 refills | Status: DC | PRN
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
## 2023-05-19 ENCOUNTER — Encounter: Payer: Self-pay | Admitting: Student

## 2023-05-19 ENCOUNTER — Other Ambulatory Visit: Payer: Self-pay

## 2023-05-19 ENCOUNTER — Ambulatory Visit: Payer: MEDICAID | Admitting: Student

## 2023-05-19 VITALS — BP 108/69 | HR 91 | Temp 97.8°F | Ht 66.0 in | Wt 168.7 lb

## 2023-05-19 DIAGNOSIS — R591 Generalized enlarged lymph nodes: Secondary | ICD-10-CM | POA: Diagnosis not present

## 2023-05-19 DIAGNOSIS — G8929 Other chronic pain: Secondary | ICD-10-CM

## 2023-05-19 DIAGNOSIS — M25512 Pain in left shoulder: Secondary | ICD-10-CM

## 2023-05-19 DIAGNOSIS — H9193 Unspecified hearing loss, bilateral: Secondary | ICD-10-CM | POA: Diagnosis not present

## 2023-05-19 MED ORDER — FLUTICASONE PROPIONATE 50 MCG/ACT NA SUSP: 1.0000 | Freq: Every day | NASAL | 2 refills | Status: DC

## 2023-05-19 NOTE — Patient Instructions (Signed)
Thank you, [name] for allowing [place] to provide your care today. Today we discussed follow up on your recent hematology visit, your recent hardware removal, and your hearing.   I have ordered the following labs for you:  Lab Orders  No laboratory test(s) ordered today     Tests ordered today:  - None   Referrals ordered today:    Referral Orders         Ambulatory referral to Audiology         Ambulatory referral to Physical Therapy      I have ordered the following medication/changed the following medications:   Stop the following medications: There are no discontinued medications.   Start the following medications: Meds ordered this encounter  Medications   fluticasone (FLONASE) 50 MCG/ACT nasal spray    Sig: Place 1 spray into both nostrils daily.    Dispense:  11.1 mL    Refill:  2     Follow up: 3 months    Remember:   - You will get a call to schedule your physical therapy AND your audiology appointments   - If you do not hear from either one by 2 weeks give this office a call and we can help follow up   - Please pick up your flonase, which will help with congestion and your cough/nasal drainage  - Please pick up a ear wax kit at the pharmacy and follow directions (options include ear drops, ear draining systems, etc.)  - If your hearing and/or cough/drainage do not improve please call us for a sooner appointment   Should you have any questions or concerns please call the internal medicine clinic at [phone].     [name] Haliburton Colbert Coyer, MD PGY-1 Internal Medicine Teaching [facility]

## 2023-05-20 DIAGNOSIS — H9190 Unspecified hearing loss, unspecified ear: Secondary | ICD-10-CM | POA: Insufficient documentation

## 2023-05-20 DIAGNOSIS — R591 Generalized enlarged lymph nodes: Secondary | ICD-10-CM | POA: Insufficient documentation

## 2023-05-20 NOTE — Assessment & Plan Note (Signed)
Patient s/p left clavicle hardware removal 04/25/23. Last seen by [facility] 05/10/23. Sutures removed, no evidence of infection or bleeding today. Patient has been taking Norco and robaxin as needed. Left extremity ROM continues to be limited.  - Placed referral to physical therapy - Patient to continue following up with [facility]

## 2023-05-20 NOTE — Assessment & Plan Note (Signed)
Patient endorses difficulties with hearing bilaterally. Denies trauma, wearing headphones continuously, or personal history of chronic ear infections. 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 Denies current dizziness or headache, endorses some tinnitus and feeling like ears get "blocked". Thinks he may have allergies and postnasal drip, does not currently use nasal sprays. Does take hydroxyzine/atarax for anxiety. Has been trying to clean ears out with swabs. On exam, significant wax in both ears, with observable erythema/irritation in both external ear canals. Negative for fluid or bleeding. Left TM with mild effusion. Low concern for infection at this time.   Wax buildup may be contributing to decreased hearing/feeling like ear is blocked. Discussed safer ways to clean out wax, including ear drops and ear cleaning kits at the pharmacy. Allergies/nasal drainage may also be contributing to "blocked" ear sensation. Hydroxyzine/atarax helpful, will add nasal spray. Will place referral to audiology to better assess type/degree of hearing loss.  - Follow up on audiology referral placed today at next visit  - START flonase nasal spray, 1 spray in both nares daily
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
## 2023-05-20 NOTE — Assessment & Plan Note (Signed)
Patient referred to oncology for lymphadenopathy identified on CT. PET/CT not concerning for hypermetabolic activity. Lymph node biopsy native for features suggestive of renal cell carcinoma. 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 Oncology plans to reassess lymphadenopathy with repeat CT chest abdomen pelvis in 3 months. Patient afebrile, no new related complaints. Will monitor.

## 2023-05-25 NOTE — Progress Notes (Signed)
Internal Medicine Clinic Attending  I was physically present during the key portions of the resident provided service and participated in the medical decision making of patient's management care. I reviewed pertinent patient test results.  The assessment, diagnosis, and plan were formulated together and I agree with the documentation in the resident's note.  Reymundo Poll, MD
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
## 2023-06-07 ENCOUNTER — Ambulatory Visit: Payer: MEDICAID

## 2023-06-07 ENCOUNTER — Encounter: Payer: Self-pay | Admitting: Orthopaedic Surgery

## 2023-06-07 ENCOUNTER — Ambulatory Visit (INDEPENDENT_AMBULATORY_CARE_PROVIDER_SITE_OTHER): Payer: MEDICAID | Admitting: Orthopaedic Surgery

## 2023-06-07 DIAGNOSIS — Z9889 Other specified postprocedural states: Secondary | ICD-10-CM

## 2023-06-07 IMAGING — DX DG CHEST 1V PORT
1 series · 1 of 1 positions shown · non-contrast
Comparison: Prior chest radiographs 489195 and earlier.

CLINICAL DATA: Intubation.

EXAM:
PORTABLE CHEST 1 VIEW

[chest ap]
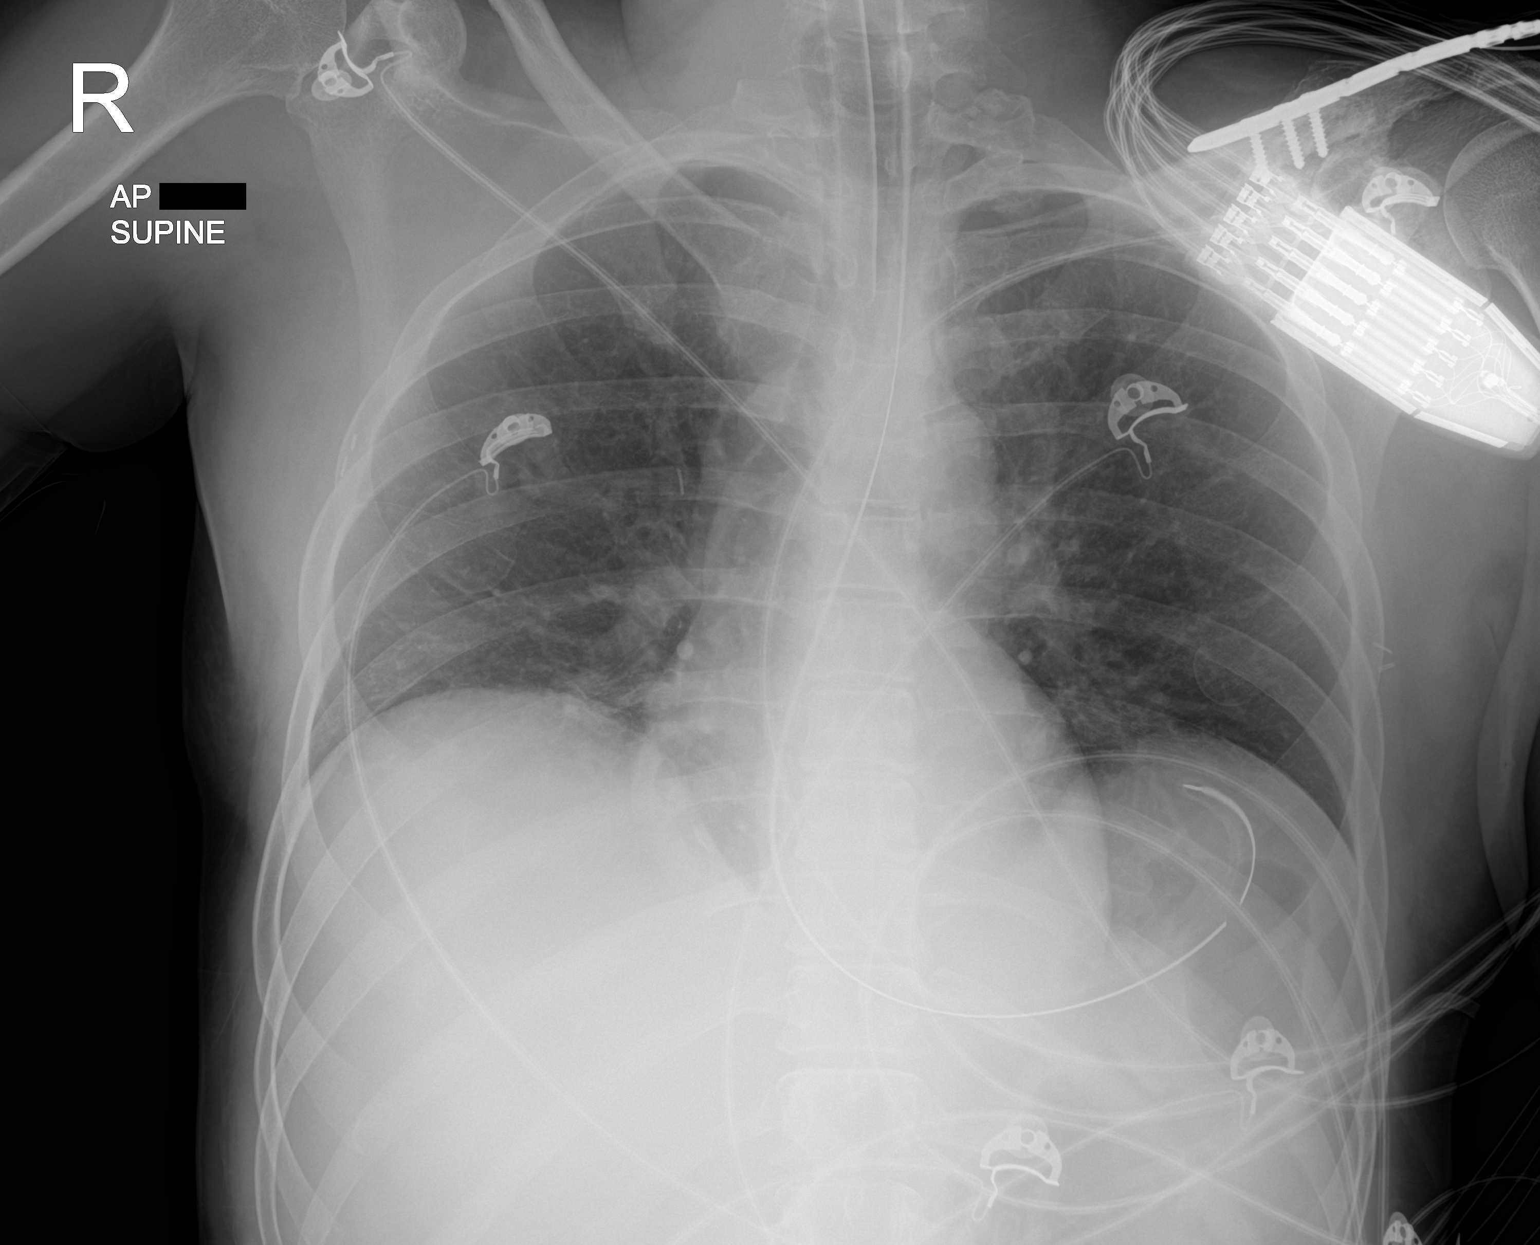

[1 of 1 positions shown; findings below may reference images not displayed]

FINDINGS: ET tube terminates at the level of the clavicular heads. An enteric
tube passes below the level left hemidiaphragm with tip projecting
in the expected location of the stomach. Low lung volumes with
minimal bibasilar atelectasis. No appreciable airspace
consolidation. No evidence of pleural effusion or pneumothorax. No
acute bony abnormality identified. Prior ORIF of the left clavicle.
IMPRESSION: ET tube terminates at the level of the clavicular heads.

Enteric tube with tip projecting in the region of the stomach.

Low lung volumes with mild bibasilar atelectasis. No appreciable
airspace consolidation.

## 2023-06-07 IMAGING — DX DG ABDOMEN 1V
1 series · 1 of 1 positions shown · non-contrast
Comparison: Chest radiograph 01/03/2021

CLINICAL DATA: Repositioning of OG tube.

EXAM:
ABDOMEN - 1 VIEW

[abdomen kub]
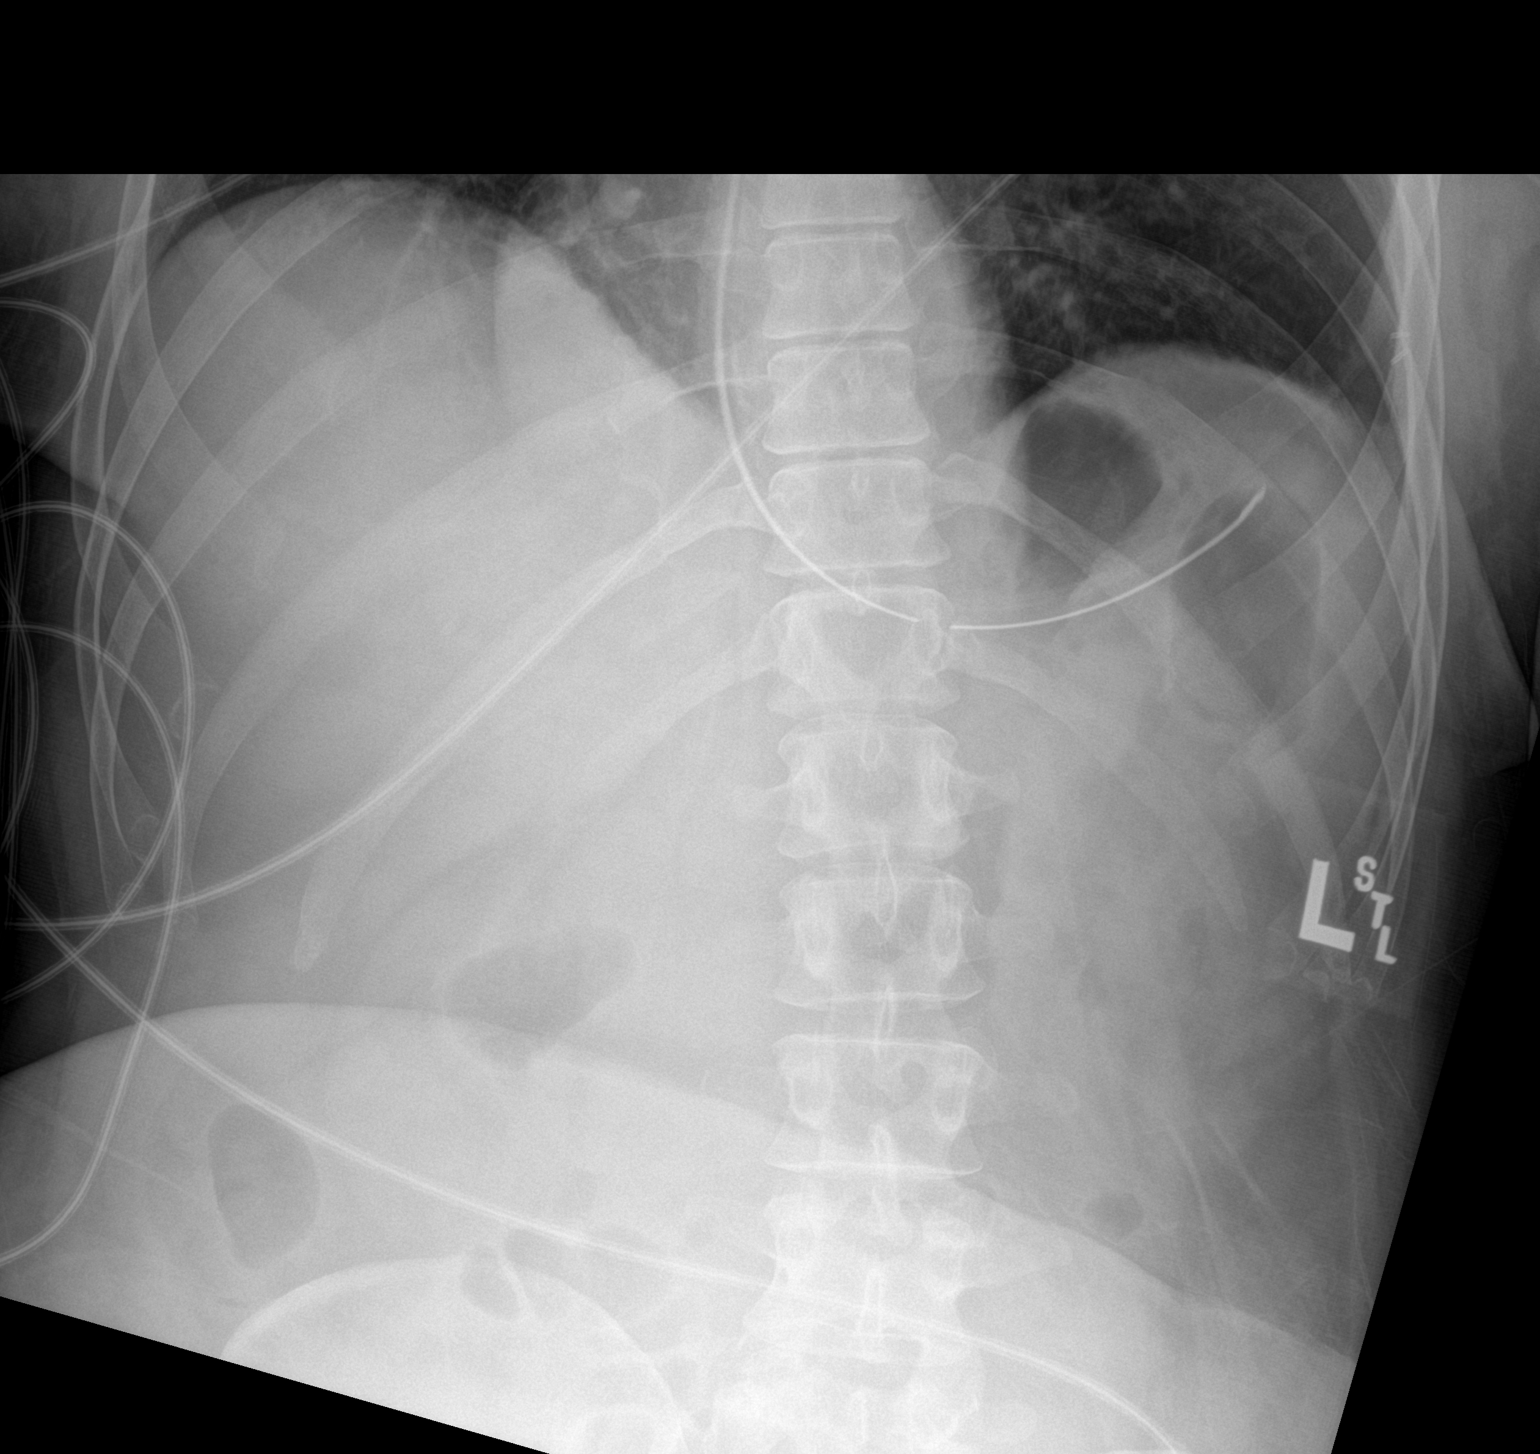

[1 of 1 positions shown; findings below may reference images not displayed]

FINDINGS: OG tube is in a similar position with the tip near the gastric
fundus. Mild elevation of the right hemidiaphragm. Normal bowel gas
pattern in the upper abdomen.
IMPRESSION: Stable appearance of the OG tube with the tip in the gastric fundus
region.
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
## 2023-06-08 IMAGING — DX DG CHEST 1V PORT
1 series · 1 of 1 positions shown · non-contrast
Comparison: Prior chest radiographs 01/03/2021 and earlier.

CLINICAL DATA: ET tube.

EXAM:
PORTABLE CHEST 1 VIEW

[chest ap]
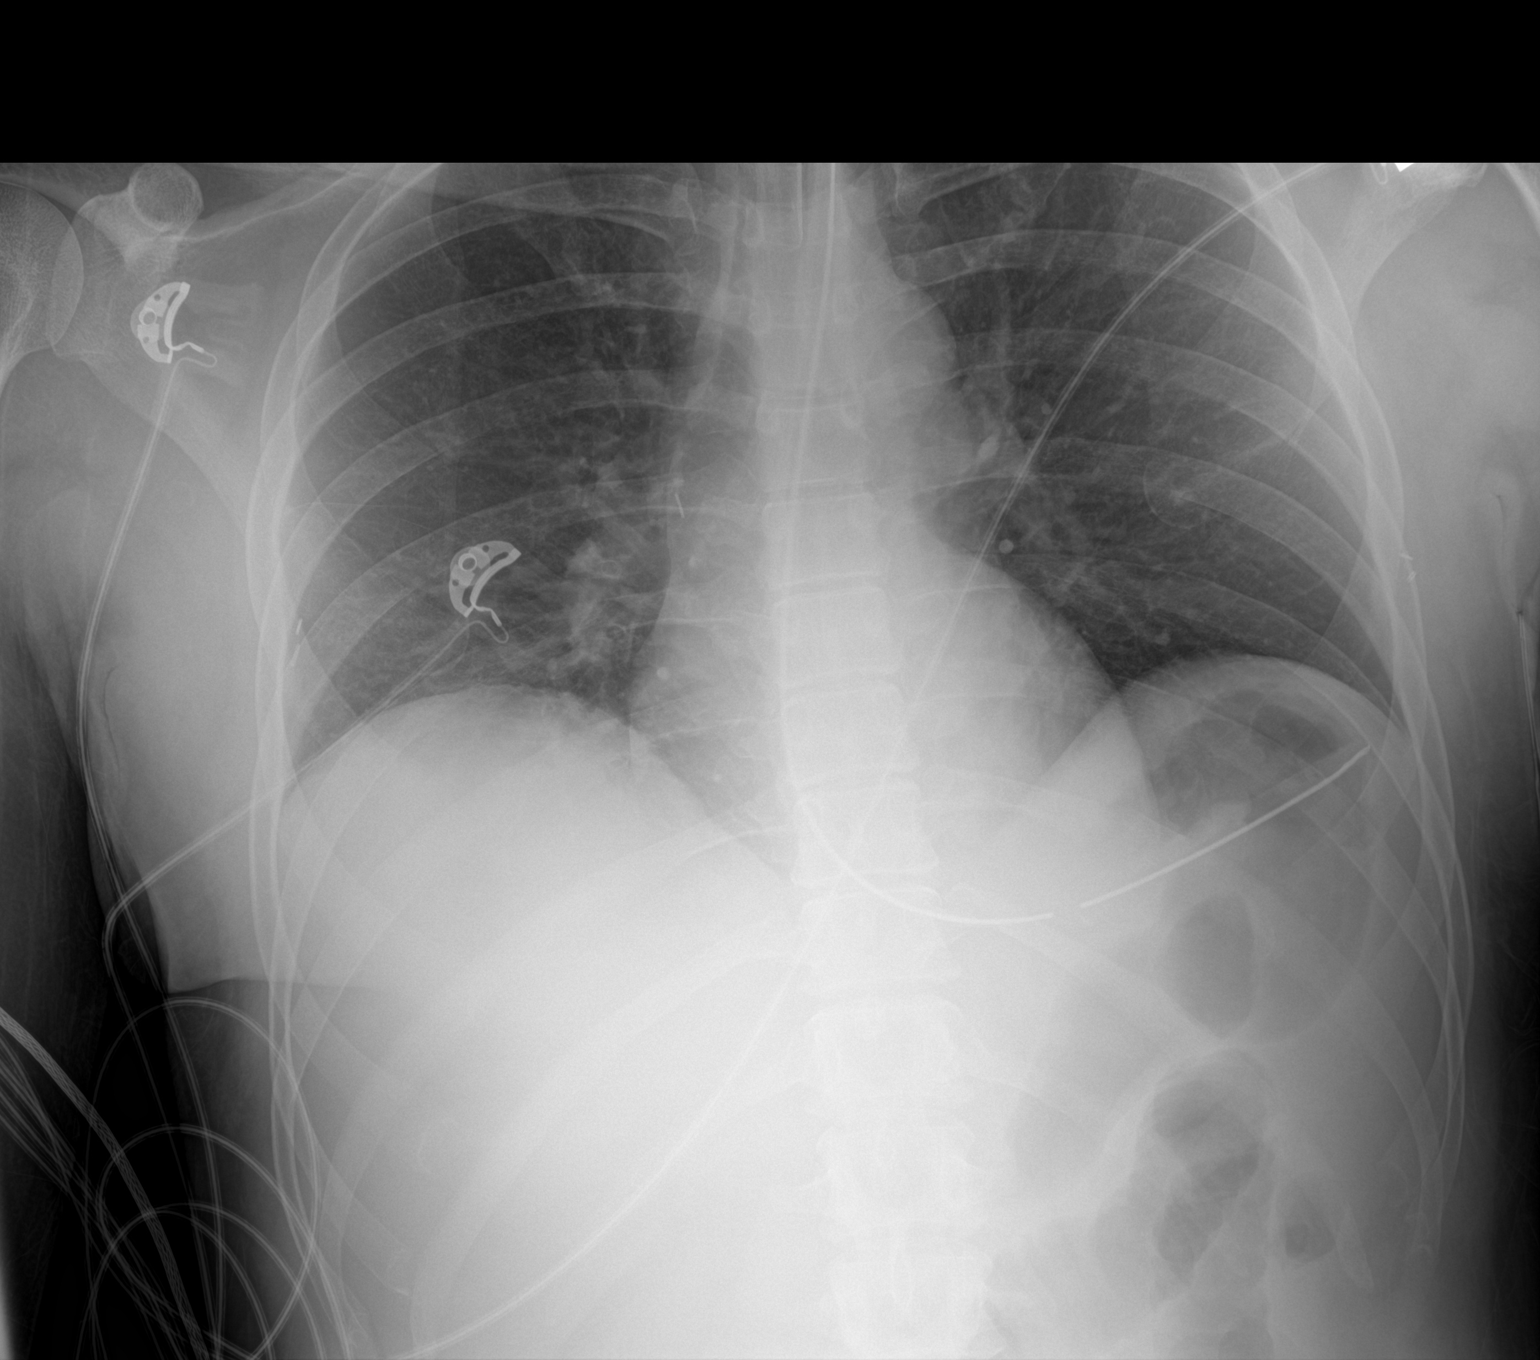

[1 of 1 positions shown; findings below may reference images not displayed]

FINDINGS: The ET tube terminates 3 cm above the level of the carina. An
enteric tube passes below the level of the left hemidiaphragm with
tip projecting in the region of the stomach.

Heart size within normal limits. Mild right basilar atelectasis. No
appreciable airspace consolidation or pulmonary edema. No evidence
of pleural effusion or pneumothorax. No acute bony abnormality
identified. Prior ORIF of the left clavicle.
IMPRESSION: ET tube terminates 3 cm above the level of the carina.

The enteric tube passes below the level left hemidiaphragm with tip
projecting in the region of the stomach.

Mild right basilar atelectasis.

## 2023-06-08 IMAGING — DX DG CHEST 1V
1 series · 2 of 2 positions shown · non-contrast
Comparison: Prior chest radiographs performed 01/04/2021 and
earlier.

CLINICAL DATA: ET tube repositioned.

EXAM:
CHEST  1 VIEW

[Series 1: chest ap · 0.14mm/px · 2 of 2 slices shown]
[im 1/2]
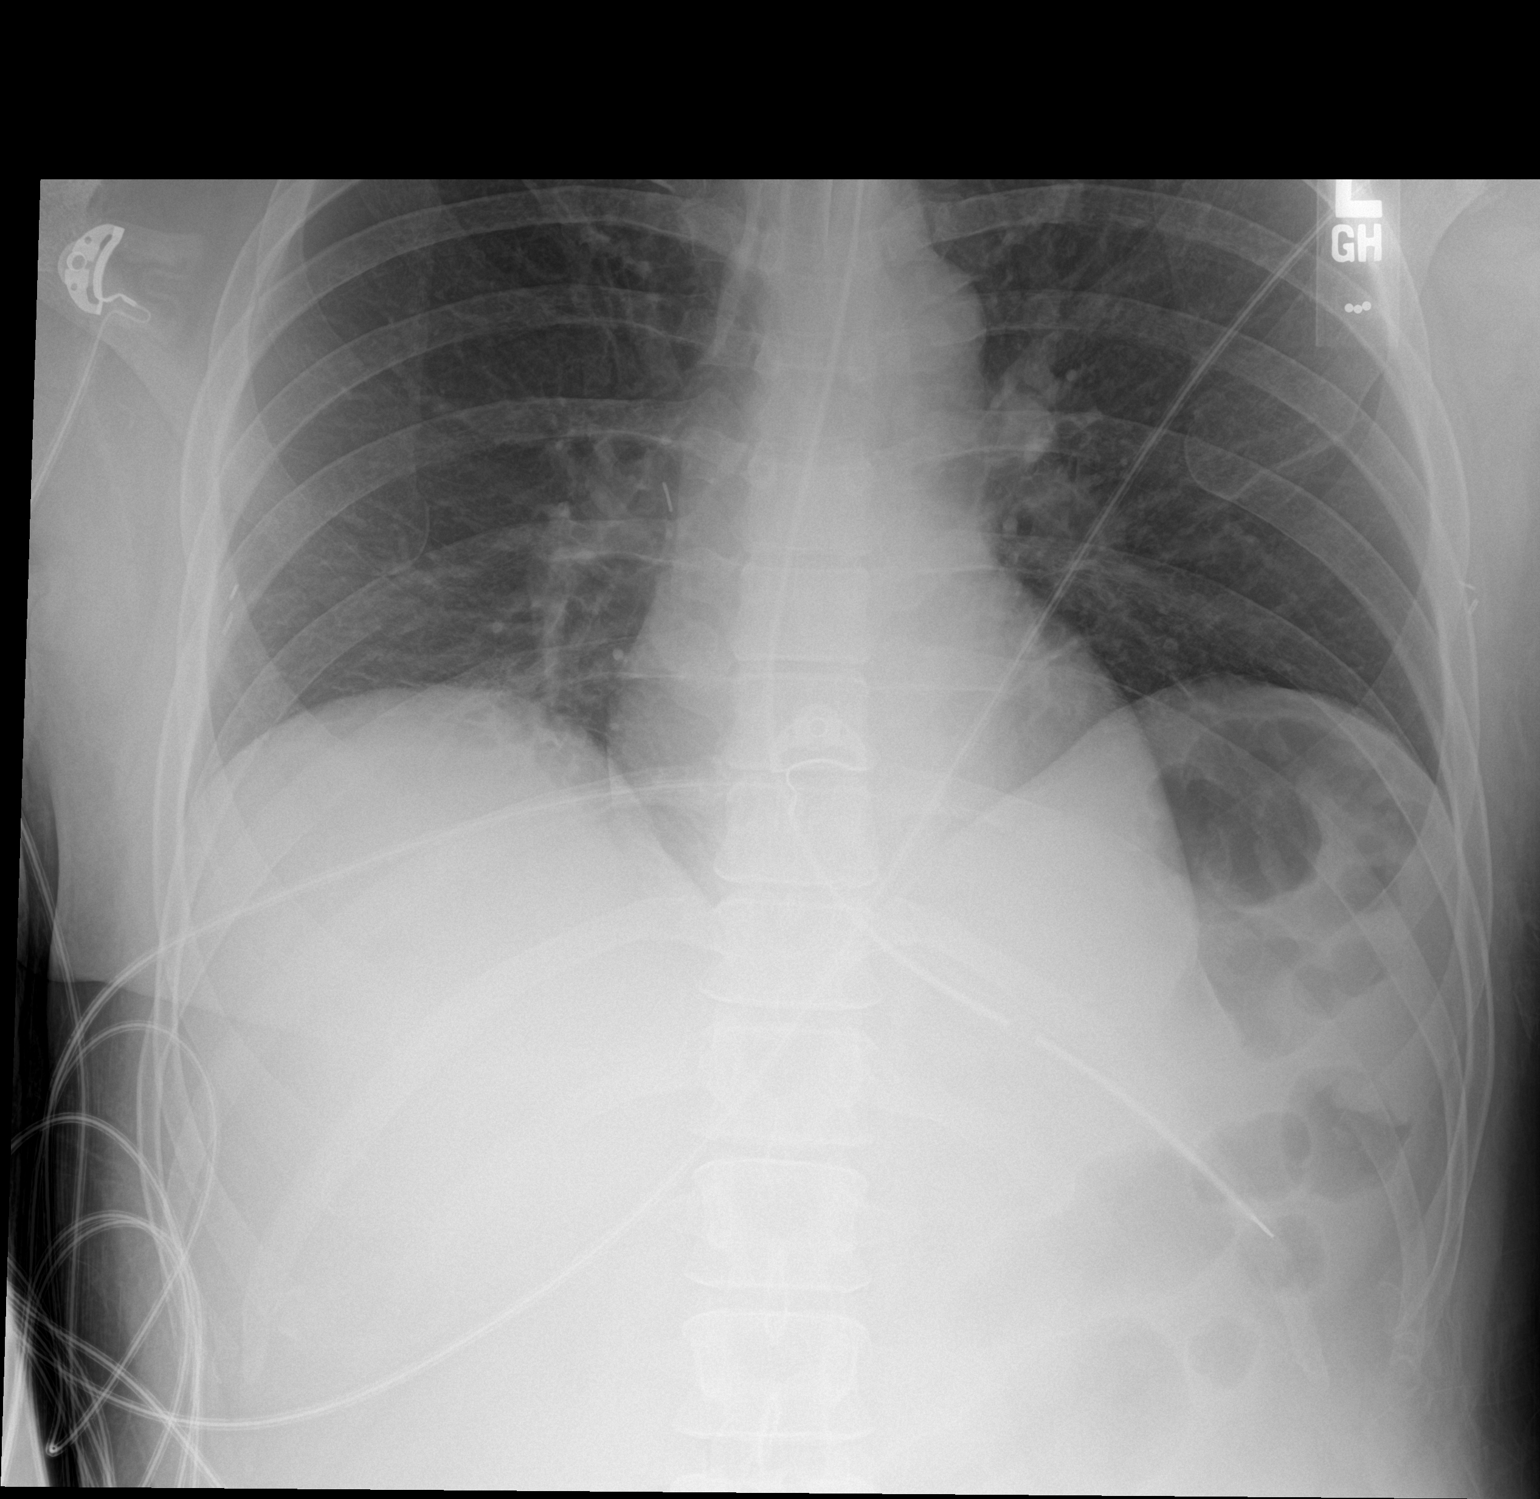
[im 2/2]
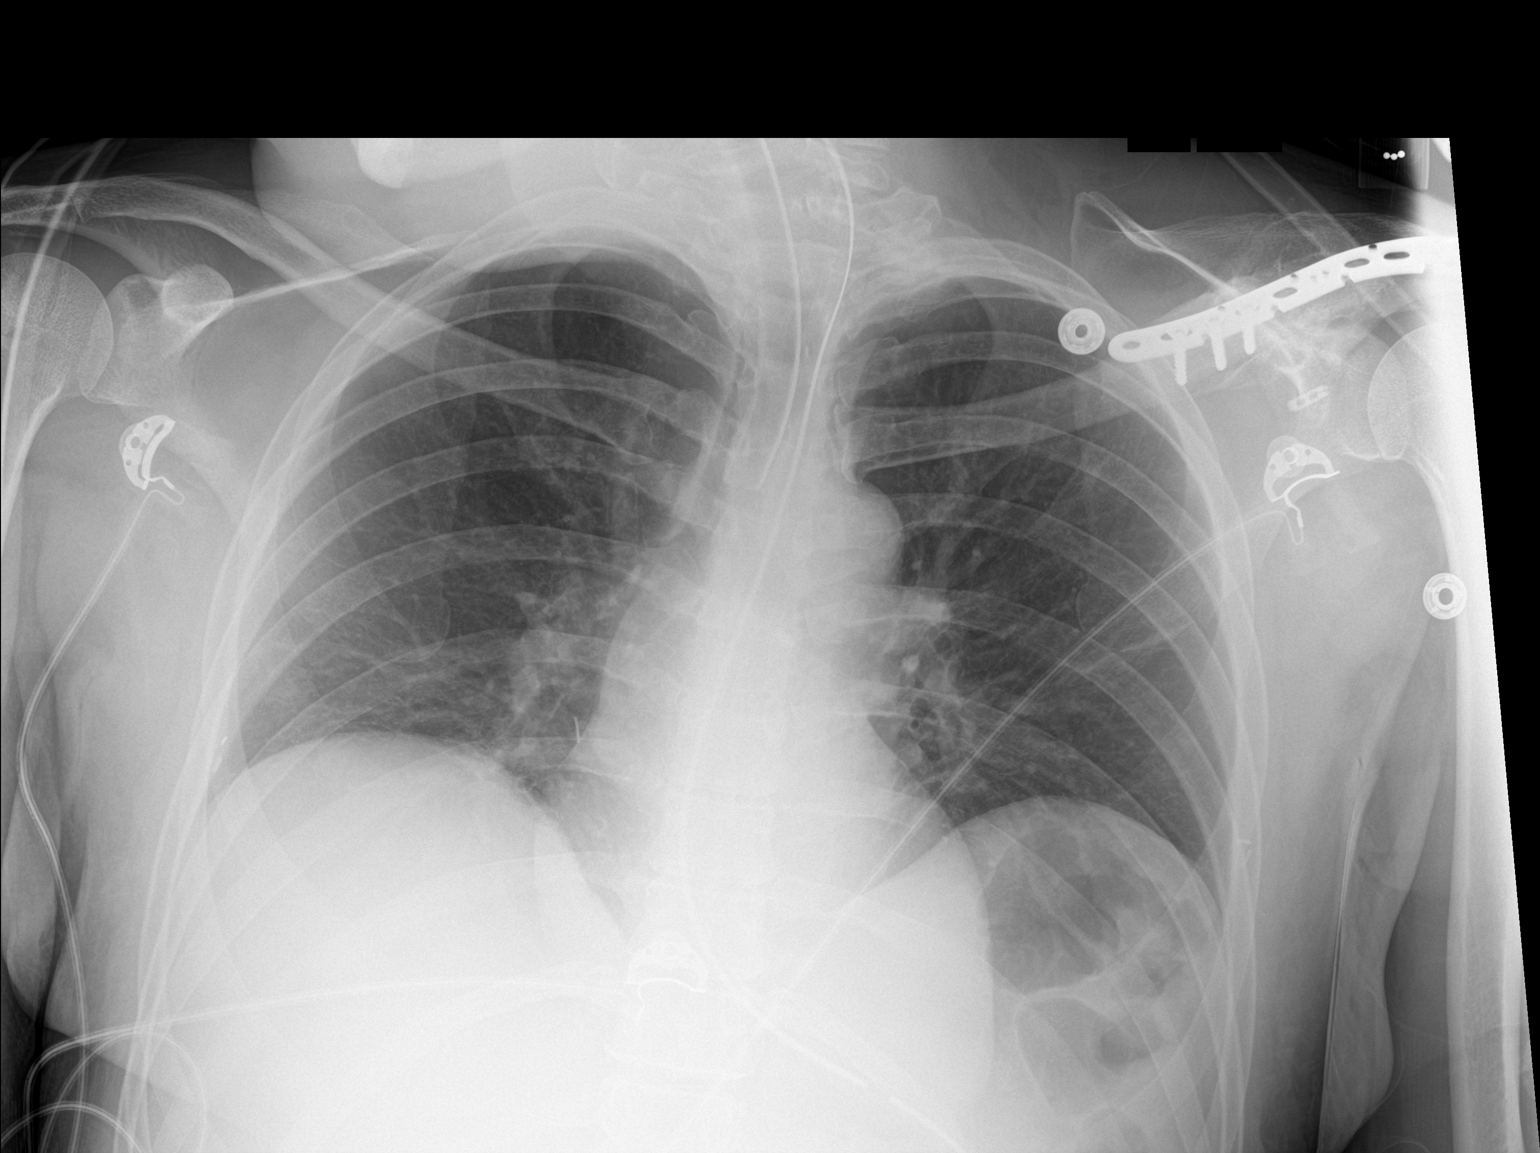

[2 of 2 positions shown; findings below may reference images not displayed]

FINDINGS: The ET tube now terminates 2 cm above the level of the carina. An
enteric tube passes below the level of the left hemidiaphragm with
tip projecting in the region of the stomach. Mild persistent
bibasilar atelectasis. No appreciable airspace consolidation or
pulmonary edema. No evidence of pleural effusion or pneumothorax.
Prior left clavicular ORIF.
IMPRESSION: The ET tube now terminates 2 cm above the level of the carina.

The enteric tube passes below the level of the left hemidiaphragm
with tip projecting in the expected location of the stomach.

Mild persistent bibasilar atelectasis.

## 2023-06-09 IMAGING — DX DG CHEST 1V PORT
1 series · 1 of 1 positions shown · non-contrast
Comparison: Portable exam 7554 hours compared to 01/04/2021

CLINICAL DATA: Acute respiratory failure, intubation

EXAM:
PORTABLE CHEST 1 VIEW

[chest ap]
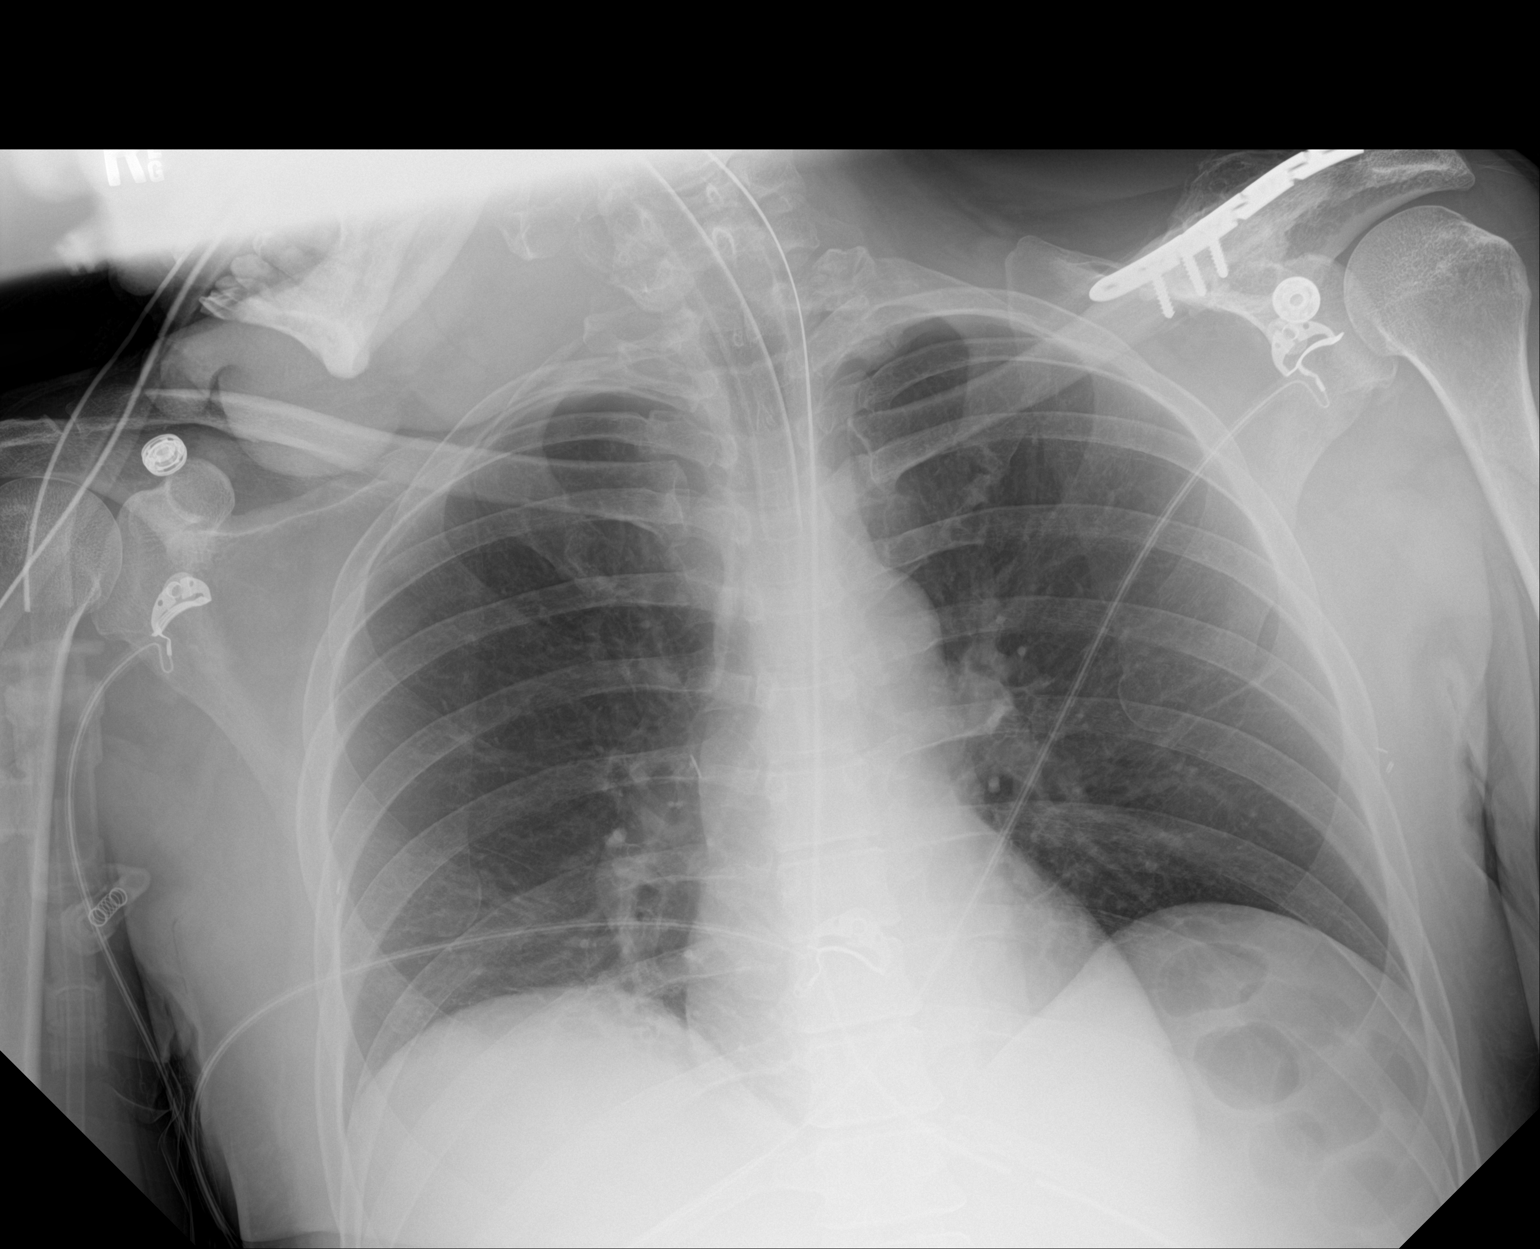

[1 of 1 positions shown; findings below may reference images not displayed]

FINDINGS: Tip of endotracheal tube projects 3.3 cm above carina.

Nasogastric tube extends into stomach.

Normal heart size, mediastinal contours, and pulmonary vascularity.

RIGHT basilar atelectasis.

Remaining lungs clear.

No pleural effusion or pneumothorax.
IMPRESSION: RIGHT basilar atelectasis.

## 2023-06-15 ENCOUNTER — Encounter: Payer: MEDICAID | Admitting: Physical Medicine and Rehabilitation

## 2023-06-21 ENCOUNTER — Ambulatory Visit: Payer: MEDICAID | Attending: Physical Medicine and Rehabilitation | Admitting: Audiologist

## 2023-06-21 DIAGNOSIS — H903 Sensorineural hearing loss, bilateral: Secondary | ICD-10-CM | POA: Insufficient documentation

## 2023-06-21 NOTE — Procedures (Signed)
  Outpatient Audiology and [facility] [address] [place], [place]  [address] [phone]  AUDIOLOGICAL  EVALUATION  NAME: [name] "[name]"     DOB:   07-02-1981      MRN: [id number]                                                                                     DATE: 06/21/2023     REFERENT: Kathleen Lime, MD STATUS: Outpatient DIAGNOSIS:  Sensorineural Hearing Loss  History: [name] was seen for an audiological evaluation due to difficulty hearing for several years. [name] denies pain or pressure. Tinnitus has been increasing in severity and prevalence for a few months. Occasional dizziness present.   [name] has history of hazardous noise exposure from headphone use.  There is extensive family history of hearing loss.  [name]'s mother uses hearing aids. Medical history shows chemotherapy for breast cancer which is ototoxic and a risk for hearing loss.    Evaluation:  Otoscopy showed a clear view of the tympanic membranes, bilaterally Tympanometry results were consistent with normal middle ear function bilaterally Audiometric testing was completed using Conventional Audiometry techniques with insert earphones and supraural headphones. Test results are consistent with mild sloping to moderately severe sensorineural hearing loss bilaterally. Speech Recognition Thresholds were obtained at 45 dB HL in the right ear and at 40 dB HL in the left ear. Word Recognition Testing was completed at  40dB SL, scored 96% in the right ear and 100% in the left ear  Results:  The test results were reviewed with [name].  They have a mild sloping to moderately severe sensorineural hearing loss in both ears.  Hearing loss is worse than expected for 42 years old.  [name] reports extensive history of medical issues including decreasing eyesight, chemotherapy, and head injury.  It was recommended that they talk with their primary care doctor if pain or pressure or other symptoms arise.   Recommend hearing aids for both ears.  The only known local provider of hearing aids for adult Medicaid patients is [facility]. Audiogram printed and provided to [name].    Recommendations: Hearing aids recommended for both ears. Patient given list of over-the-counter options and the number for [facility] Annual audiometric testing recommended to monitor hearing loss for progression.    32 minutes spent testing and counseling on results.   If you have any questions please feel free to contact me at [phone].  Ammie Ferrier Au.D.  Audiologist   06/21/2023  1:52 PM  Cc: Kathleen Lime, MD

## 2023-06-22 ENCOUNTER — Ambulatory Visit: Payer: MEDICAID | Admitting: Physical Therapy

## 2023-07-14 IMAGING — CT CT HEAD W/O CM
3 of 4 series · 14 of 47 positions shown, 16 images · non-contrast
Comparison: 12/30/2017

CLINICAL DATA: Head trauma.  Chest pain and headache after falling.

EXAM:
CT HEAD WITHOUT CONTRAST
CT CERVICAL SPINE WITHOUT CONTRAST
TECHNIQUE: Multidetector CT imaging of the head and cervical spine was
performed following the standard protocol without intravenous
contrast. Multiplanar CT image reconstructions of the cervical spine
were also generated.

[Series 4: head wo · axial · 0.47mm/px · z∈[-130,-10]mm · 8 of 30 slices shown, 10 images]
[im 3/30  brain]
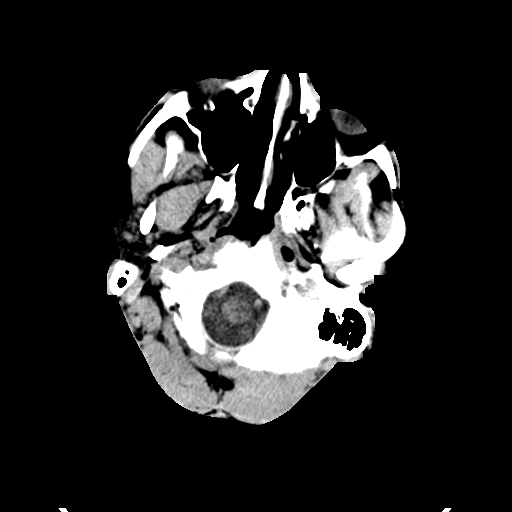
[im 3/30  bone]
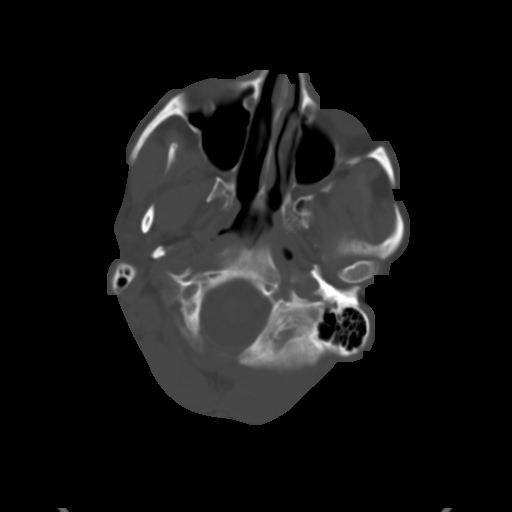
[im 6/30  brain]
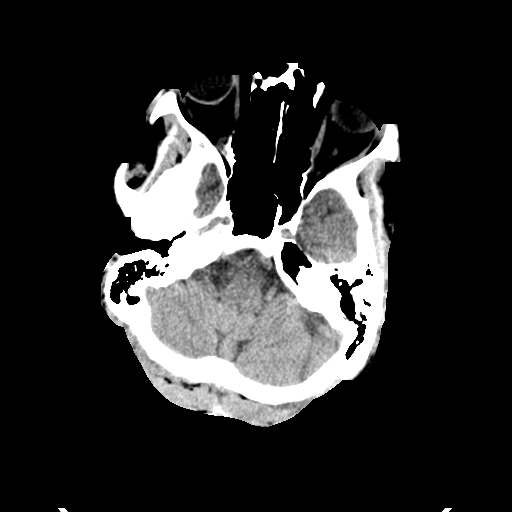
[im 9/30  brain]
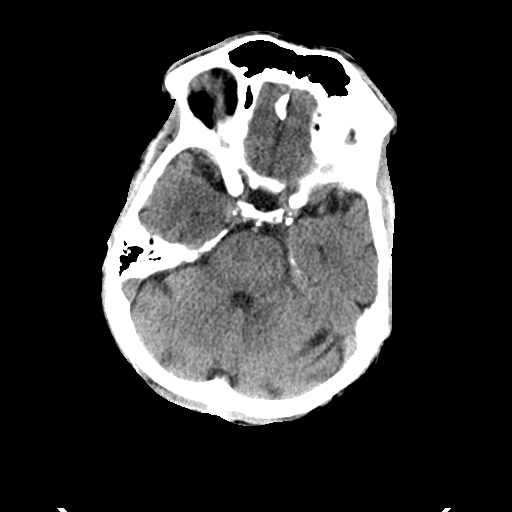
[im 12/30  brain]
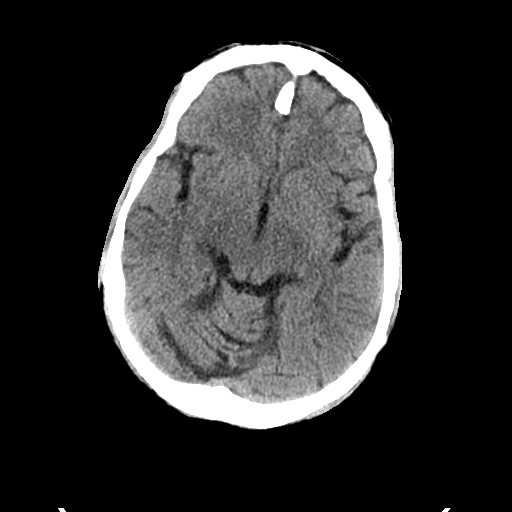
[im 18/30  brain]
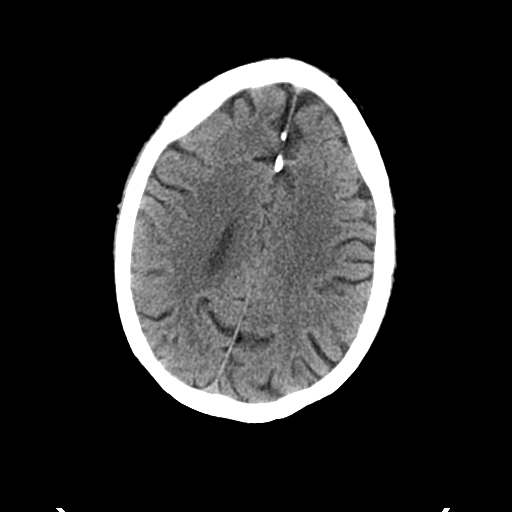
[im 18/30  bone]
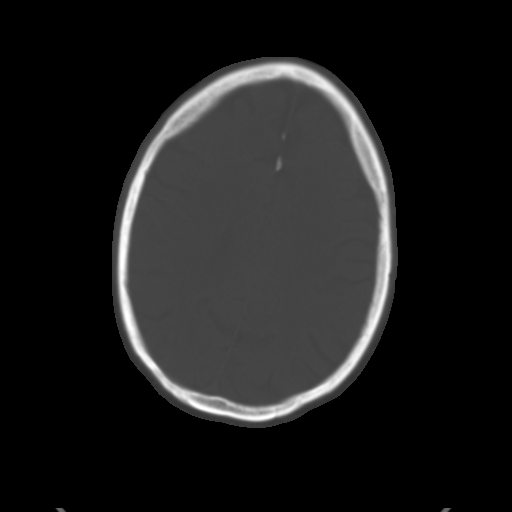
[im 21/30  brain]
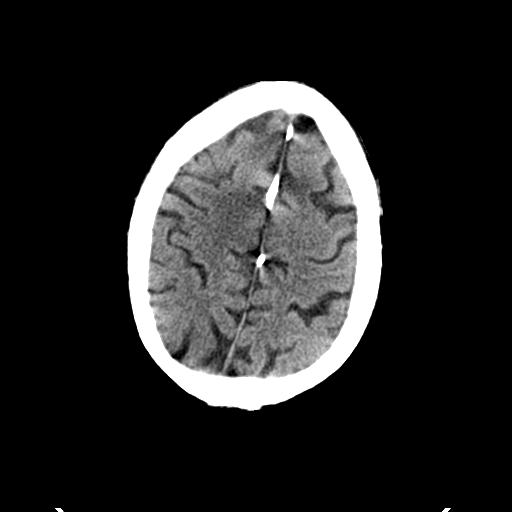
[im 24/30  brain]
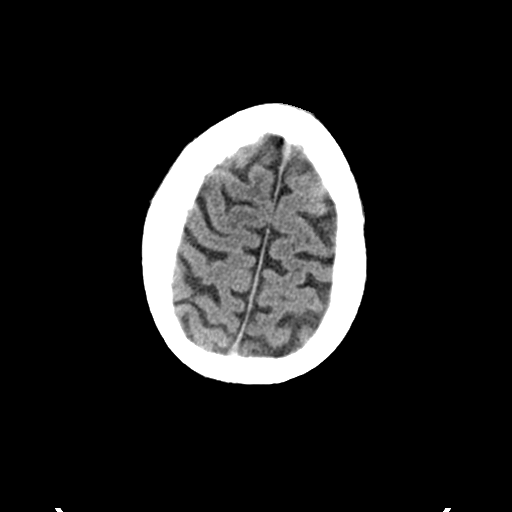
[im 27/30  brain]
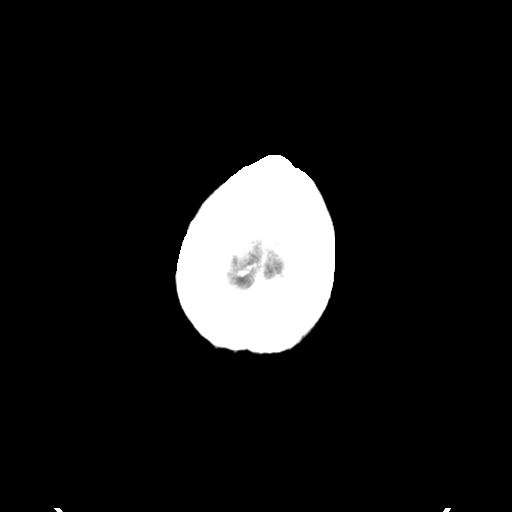

[Series 6: coronal soft tissue · coronal · 0.29mm/px · 3 of 68 slices shown]
[im 23/68  brain]
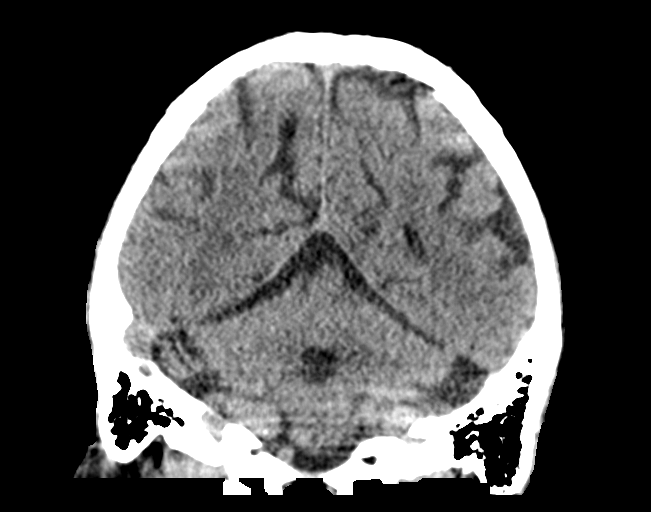
[im 30/68  brain]
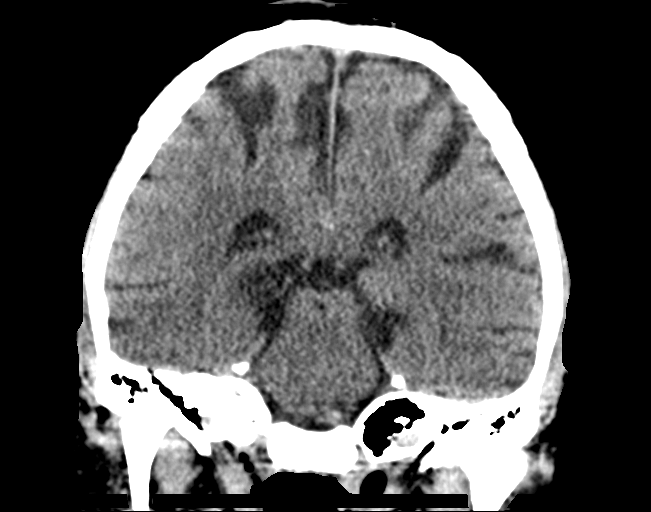
[im 38/68  brain]
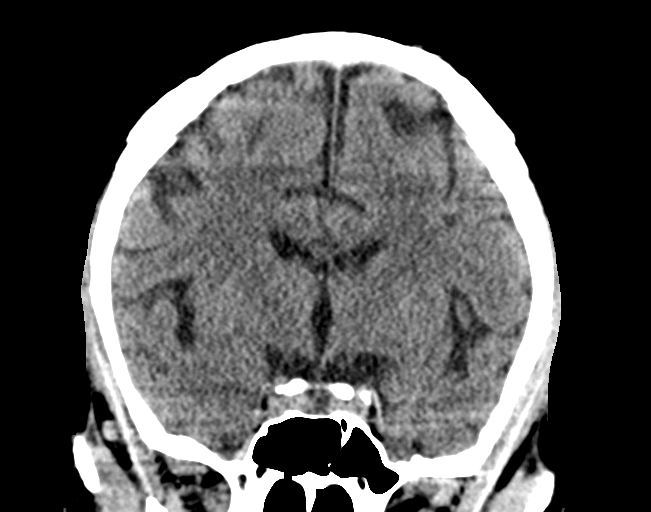

[Series 7: sagittal soft tissue · sagittal · 0.29mm/px · 3 of 61 slices shown]
[im 21/61  brain]
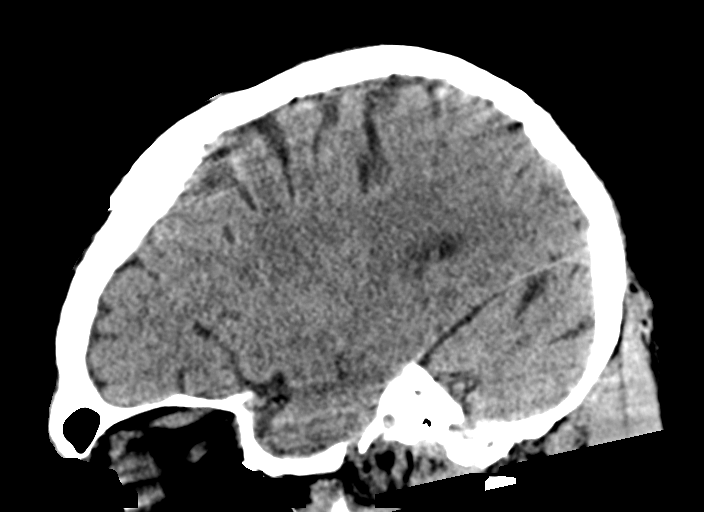
[im 31/61  brain]
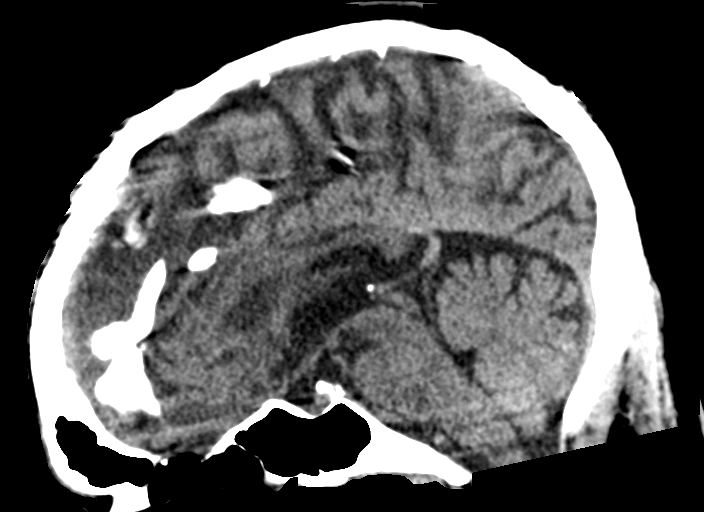
[im 41/61  brain]
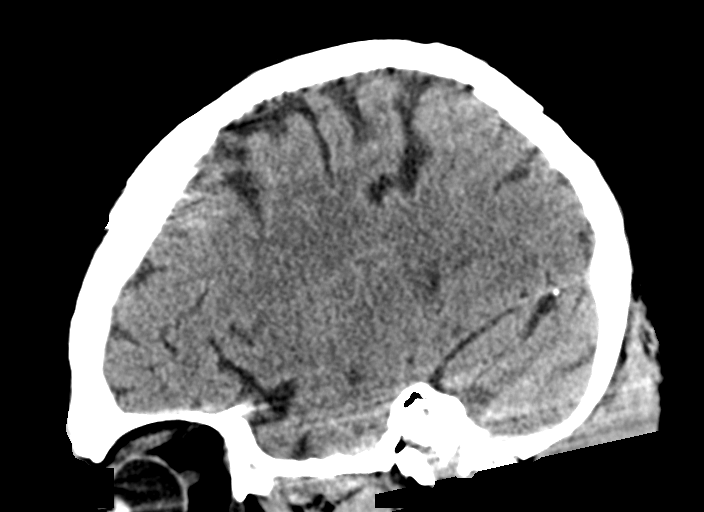

[14 of 47 positions shown; findings below may reference images not displayed]

FINDINGS: CT HEAD FINDINGS

Brain: Exam motion limited along the cerebral convexities. No
evidence of acute infarction, hemorrhage, hydrocephalus, extra-axial
collection or mass lesion/mass effect. There is prominence of the
sulci and ventricles suggesting age advanced mild brain atrophy.

Vascular: No hyperdense vessel or unexpected calcification.

Skull: Normal. Negative for fracture or focal lesion.

Sinuses/Orbits: Paranasal sinuses and mastoid air cells are clear.

Other: None

CT CERVICAL SPINE FINDINGS

Alignment: Normal.

Skull base and vertebrae: No acute fracture. No primary bone lesion
or focal pathologic process.

Soft tissues and spinal canal: No prevertebral fluid or swelling. No
visible canal hematoma.

Disc levels: Mild multilevel disc space narrowing and endplate
spurring noted at C3-4, C4-5 and C5-6.

Upper chest: Negative.

Other: None
IMPRESSION: 1. No acute intracranial abnormalities.
2. No evidence for cervical spine fracture.
3. Mild cervical degenerative disc disease.

## 2023-07-14 IMAGING — CT CT CERVICAL SPINE W/O CM
3 of 4 series · 13 of 33 positions shown, 16 images · non-contrast
Comparison: 12/30/2017

CLINICAL DATA: Head trauma.  Chest pain and headache after falling.

EXAM:
CT HEAD WITHOUT CONTRAST
CT CERVICAL SPINE WITHOUT CONTRAST
TECHNIQUE: Multidetector CT imaging of the head and cervical spine was
performed following the standard protocol without intravenous
contrast. Multiplanar CT image reconstructions of the cervical spine
were also generated.

[Series 6: orthogonal bone · axial · 0.23mm/px · z∈[-276,-155]mm · 5 of 102 slices shown, 7 images]
[im 17/102  soft-tissue]
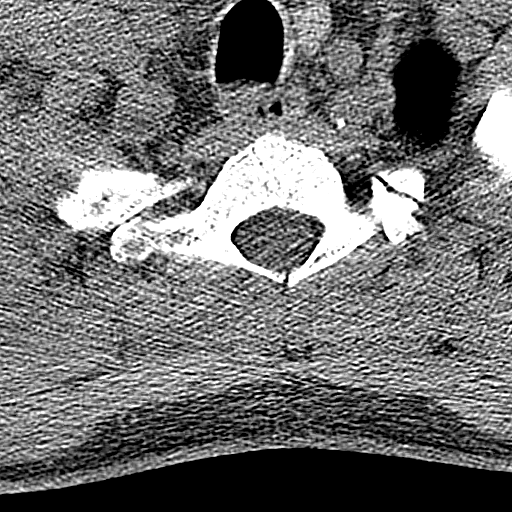
[im 17/102  bone]
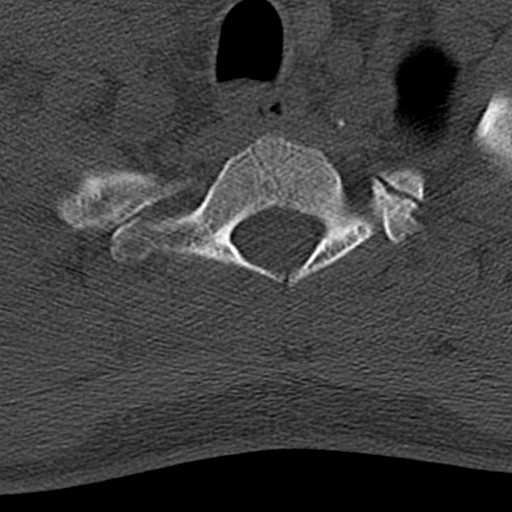
[im 34/102  bone]
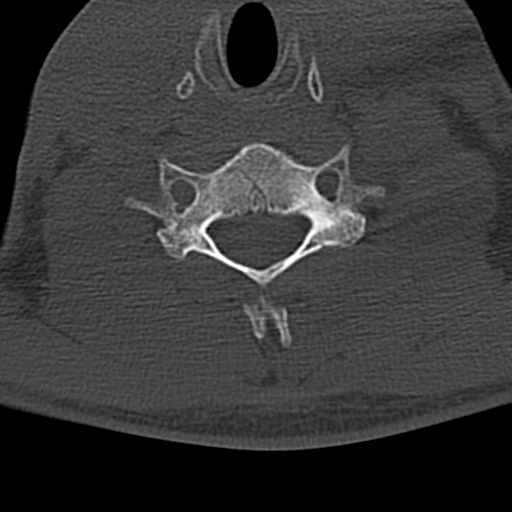
[im 51/102  bone]
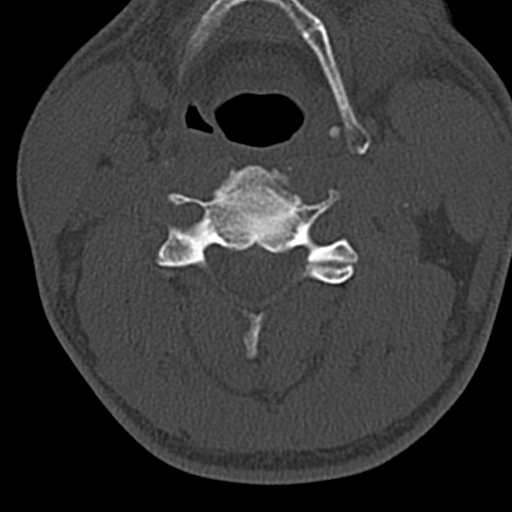
[im 68/102  bone]
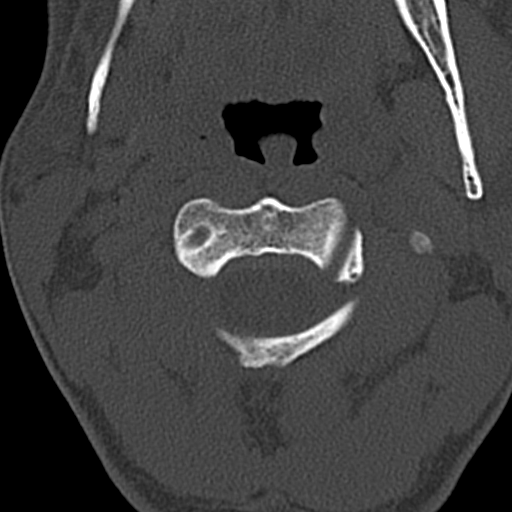
[im 85/102  soft-tissue]
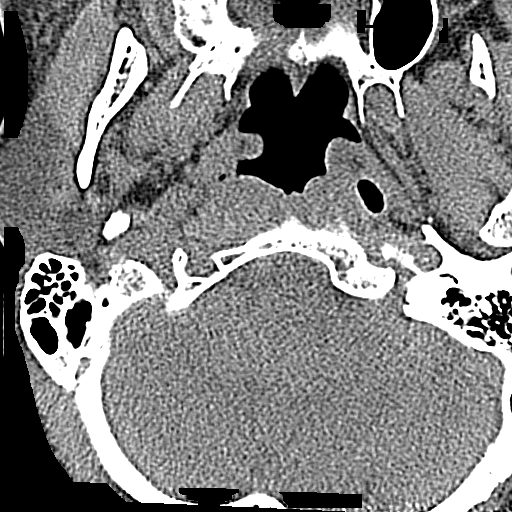
[im 85/102  bone]
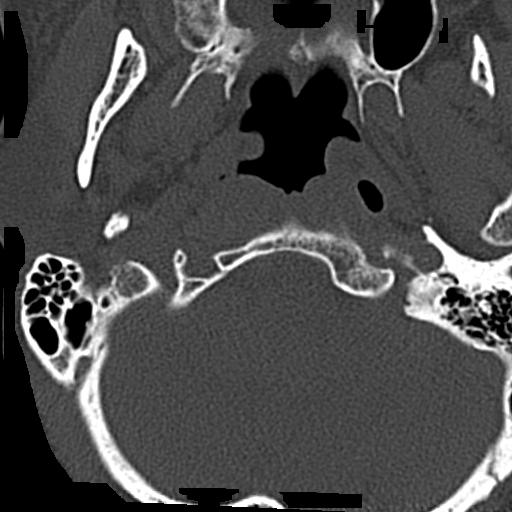

[Series 7: coronal bone · coronal · 0.23mm/px · 3 of 61 slices shown]
[im 14/61  bone]
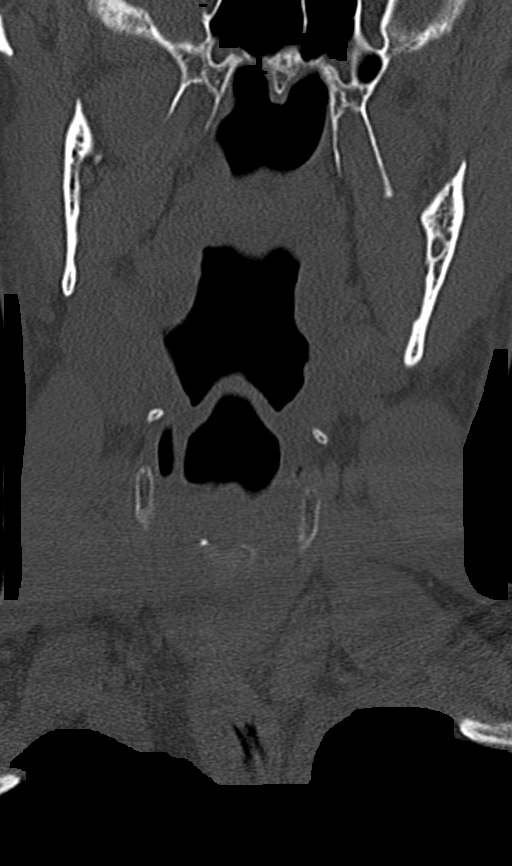
[im 25/61  bone]
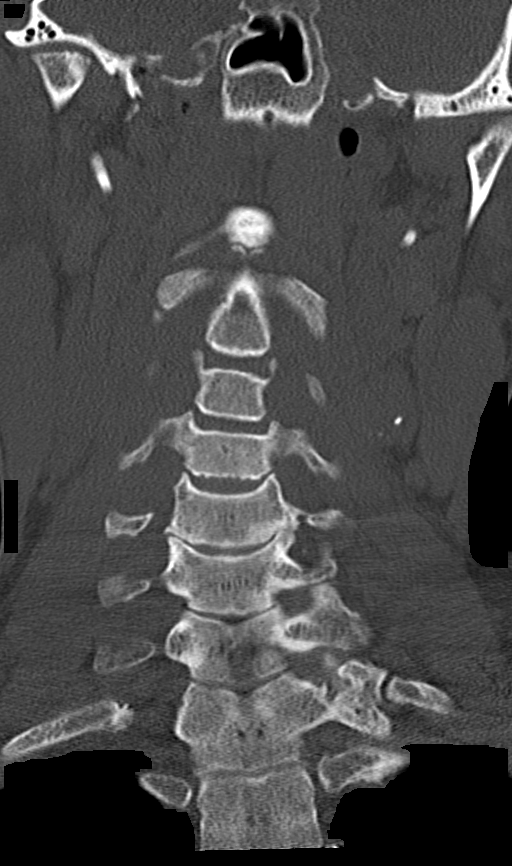
[im 36/61  bone]
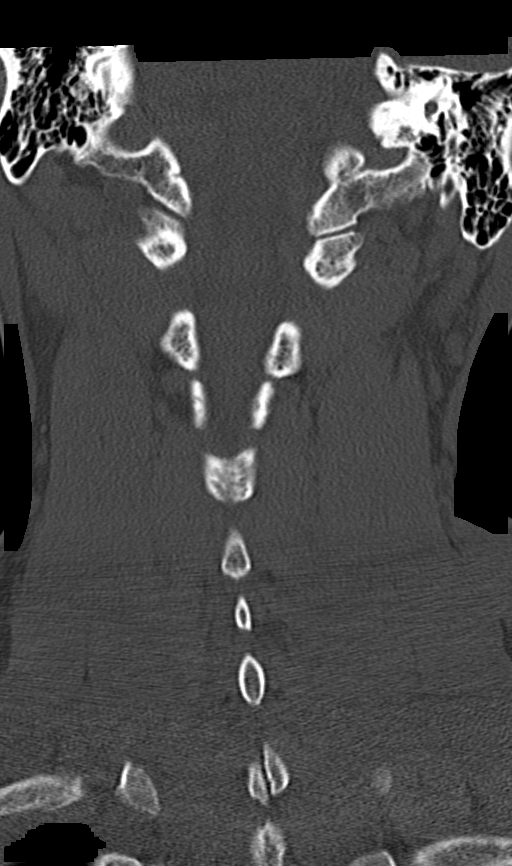

[Series 9: sagittal bone · sagittal · 0.33mm/px · 5 of 61 slices shown, 6 images]
[im 21/61  bone]
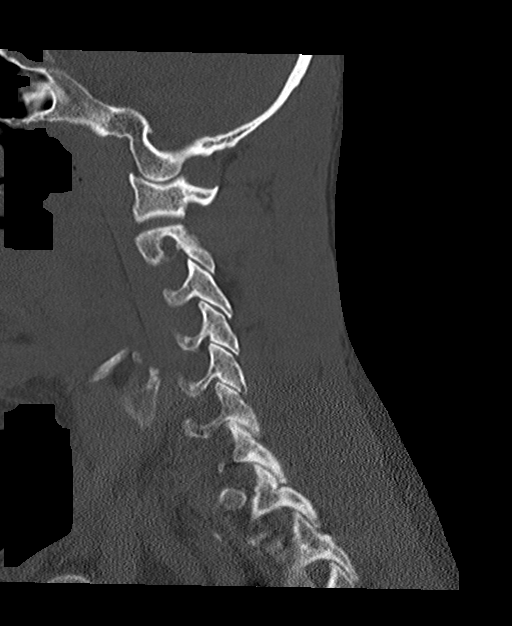
[im 26/61  bone]
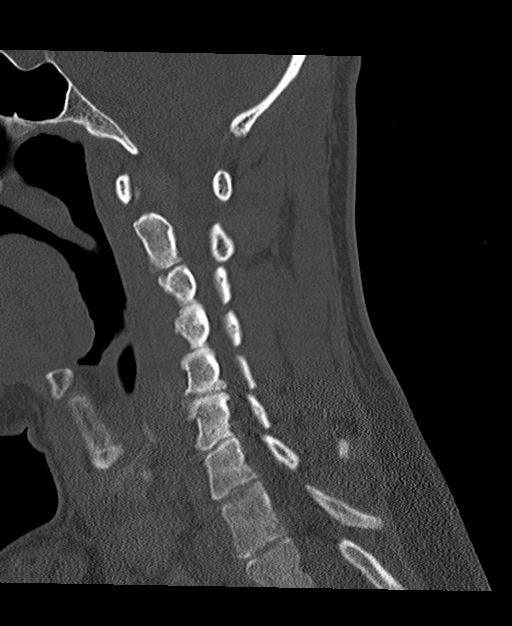
[im 31/61  soft-tissue]
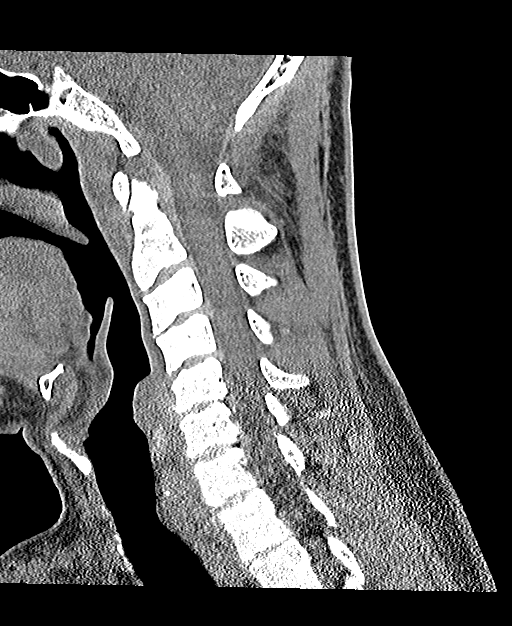
[im 31/61  bone]
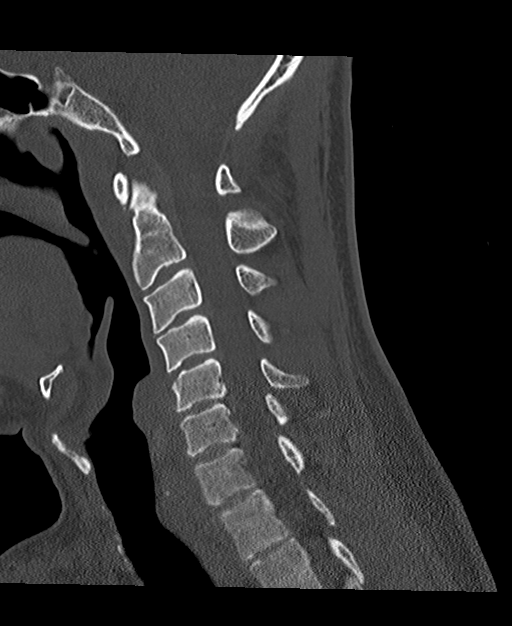
[im 36/61  bone]
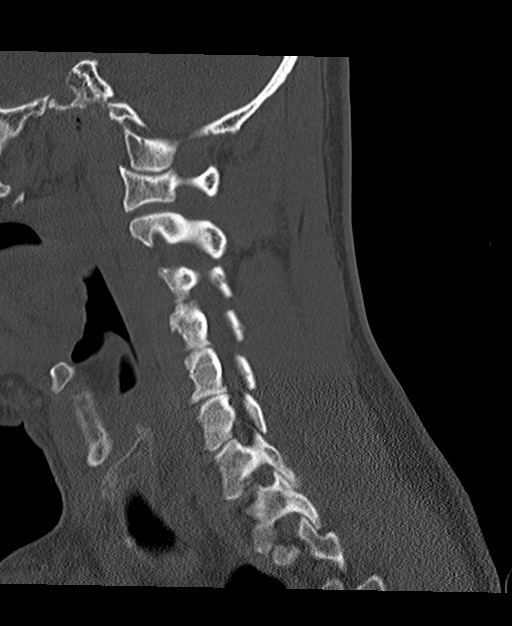
[im 41/61  bone]
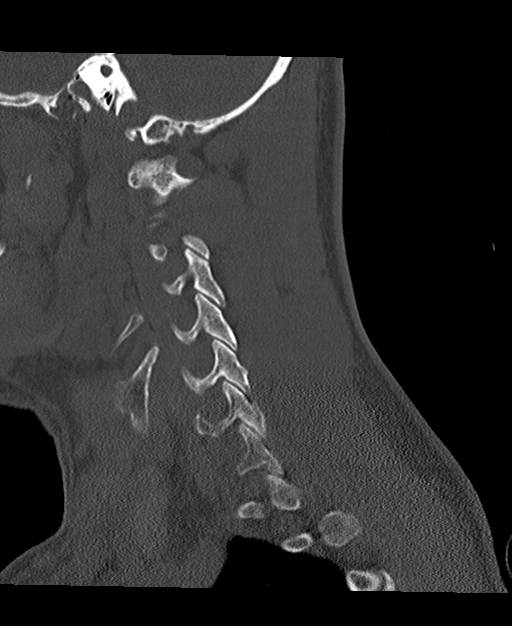

[13 of 33 positions shown; findings below may reference images not displayed]

FINDINGS: CT HEAD FINDINGS

Brain: Exam motion limited along the cerebral convexities. No
evidence of acute infarction, hemorrhage, hydrocephalus, extra-axial
collection or mass lesion/mass effect. There is prominence of the
sulci and ventricles suggesting age advanced mild brain atrophy.

Vascular: No hyperdense vessel or unexpected calcification.

Skull: Normal. Negative for fracture or focal lesion.

Sinuses/Orbits: Paranasal sinuses and mastoid air cells are clear.

Other: None

CT CERVICAL SPINE FINDINGS

Alignment: Normal.

Skull base and vertebrae: No acute fracture. No primary bone lesion
or focal pathologic process.

Soft tissues and spinal canal: No prevertebral fluid or swelling. No
visible canal hematoma.

Disc levels: Mild multilevel disc space narrowing and endplate
spurring noted at C3-4, C4-5 and C5-6.

Upper chest: Negative.

Other: None
IMPRESSION: 1. No acute intracranial abnormalities.
2. No evidence for cervical spine fracture.
3. Mild cervical degenerative disc disease.

## 2023-07-24 IMAGING — CR DG CHEST 2V
2 series · 2 of 2 positions shown · non-contrast
Comparison: 01/05/2021

CLINICAL DATA: Abdomen pain

EXAM:
CHEST - 2 VIEW

[chest lat]
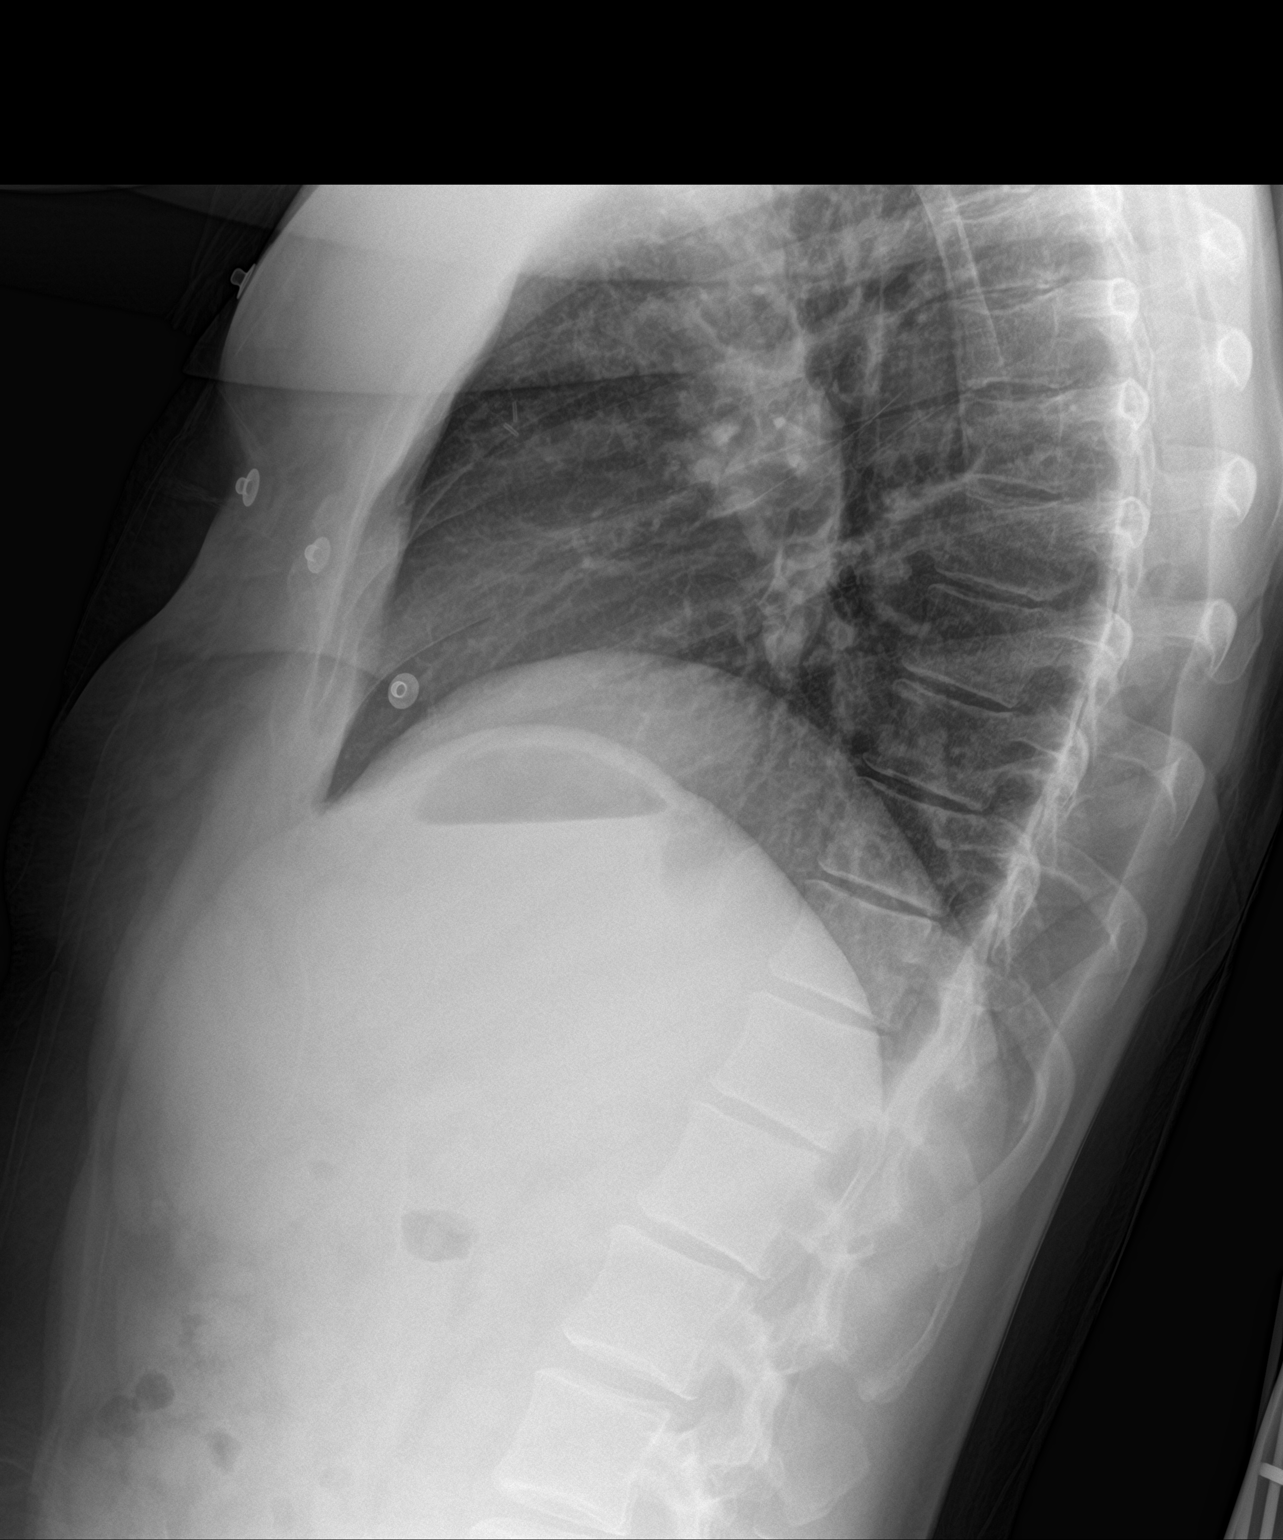

[chest ap]
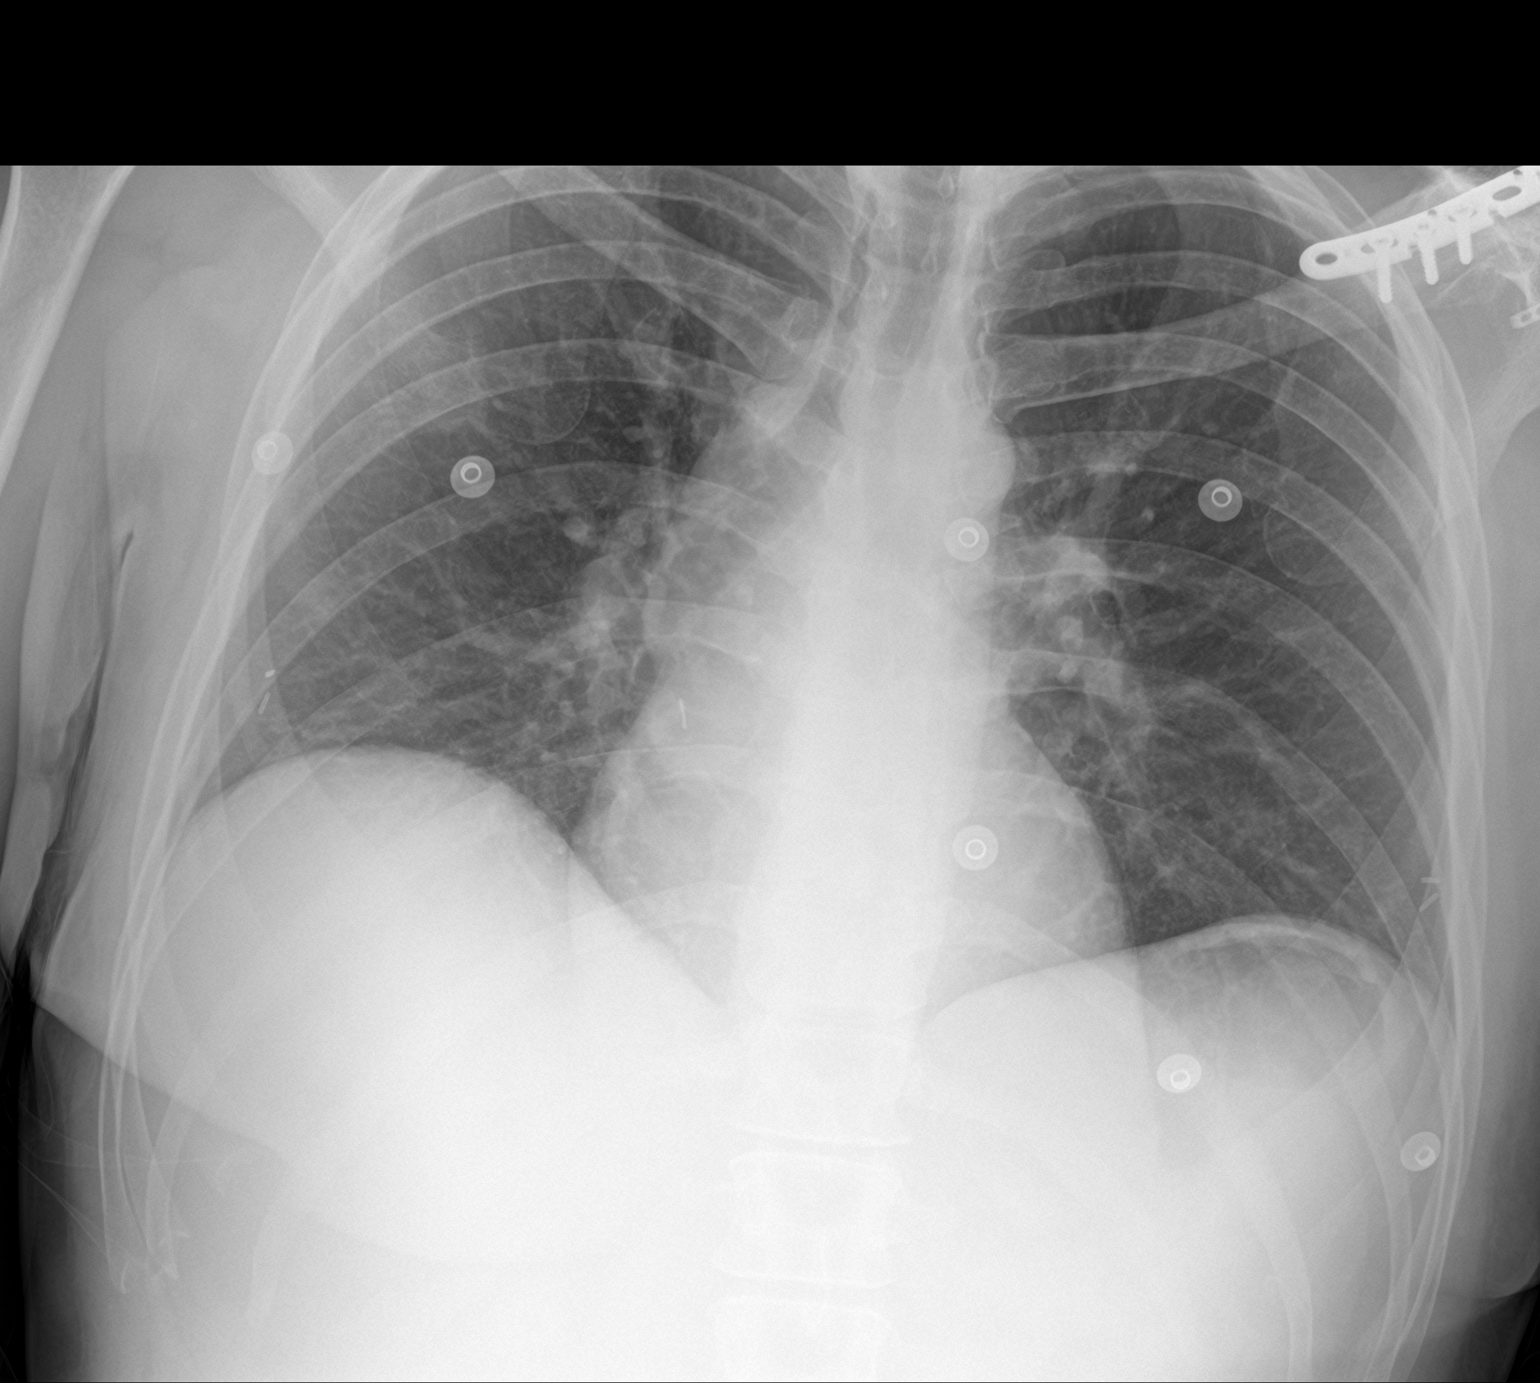

[2 of 2 positions shown; findings below may reference images not displayed]

FINDINGS: The heart size and mediastinal contours are within normal limits.
Both lungs are clear. Surgical plate and fixating screws at the
distal left clavicle.
IMPRESSION: No active cardiopulmonary disease.

## 2023-07-25 IMAGING — US US RENAL
1 series · 14 of 25 positions shown · non-contrast
Comparison: Abdominal ultrasound 11/25/2017

CLINICAL DATA: Acute kidney injury. History of partial left
nephrectomy.

EXAM:
RENAL / URINARY TRACT ULTRASOUND COMPLETE

[Series 1: us renal · 14 of 41 slices shown]
[im 1/41]
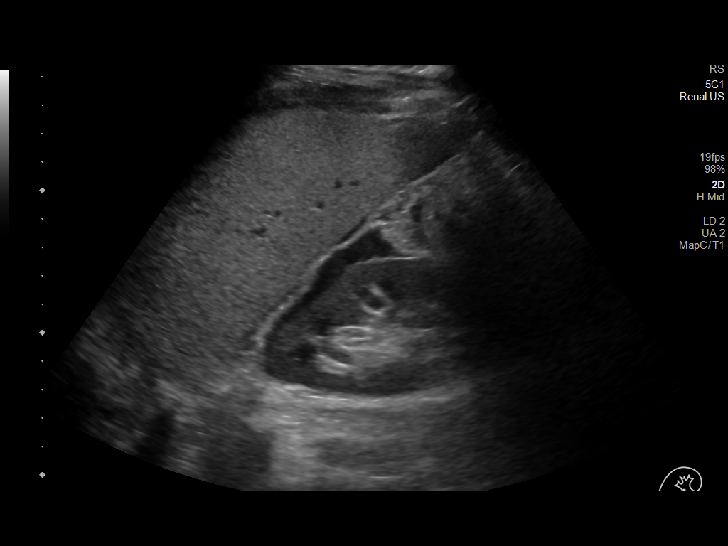
[im 4/41]
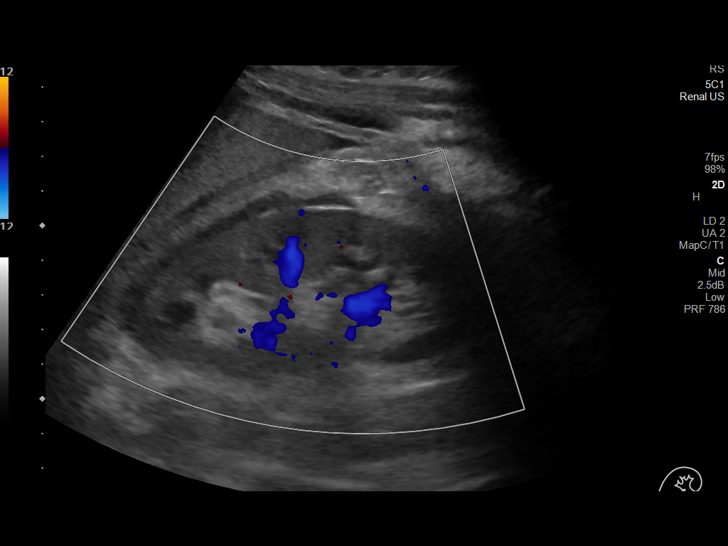
[im 7/41]
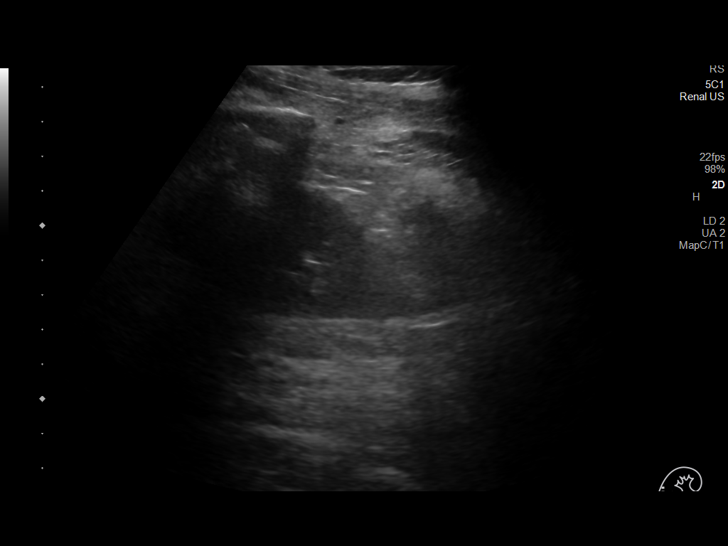
[im 11/41]
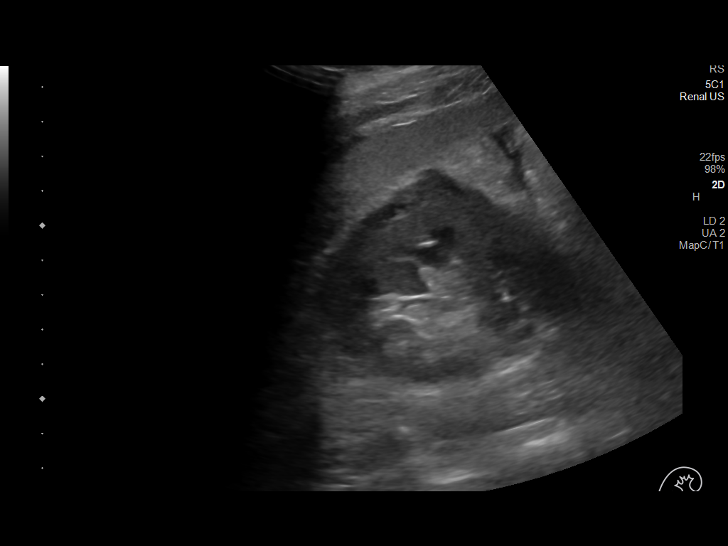
[im 14/41]
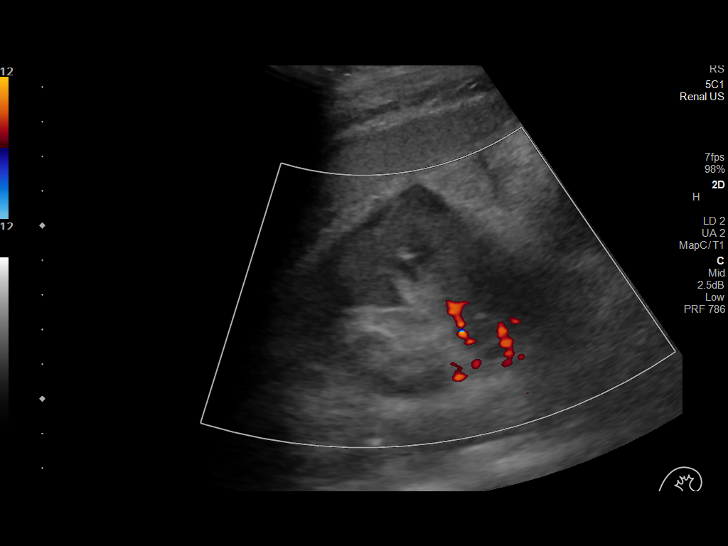
[im 16/41]
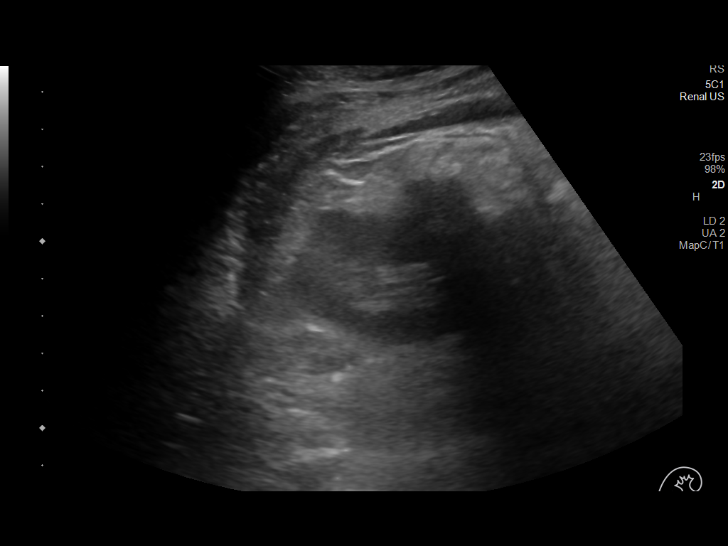
[im 19/41]
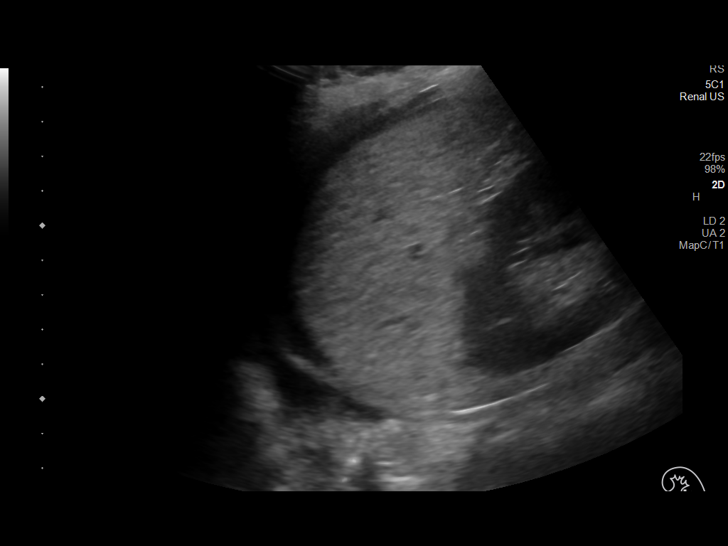
[im 22/41]
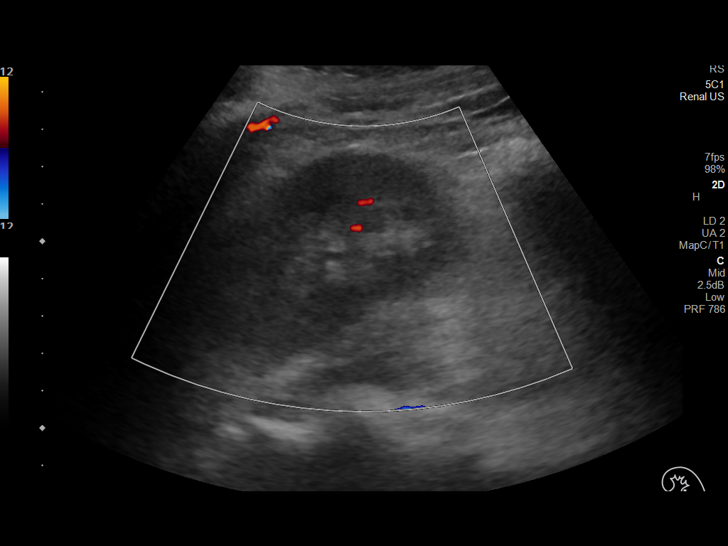
[im 26/41]
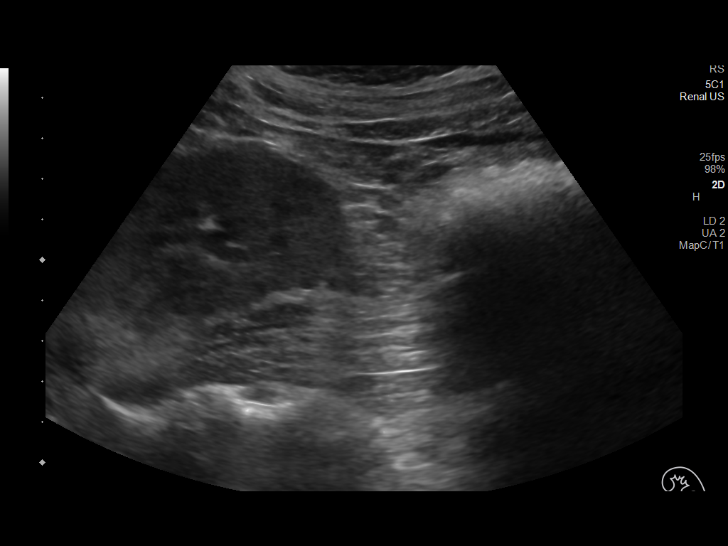
[im 27/41]
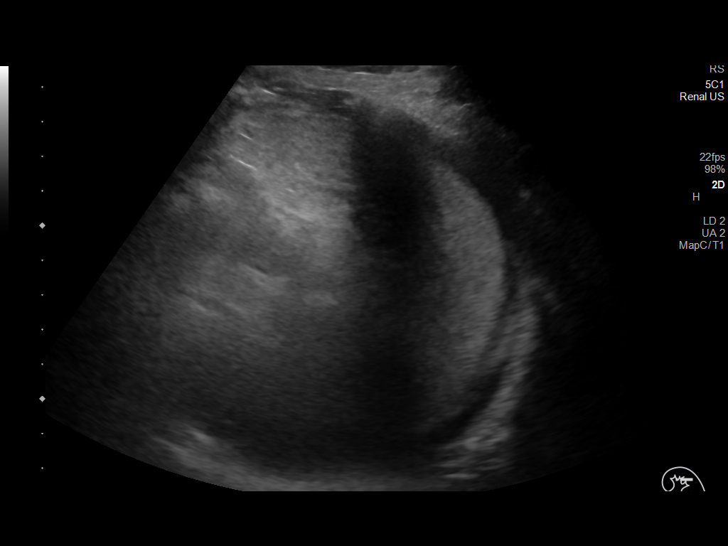
[im 31/41]
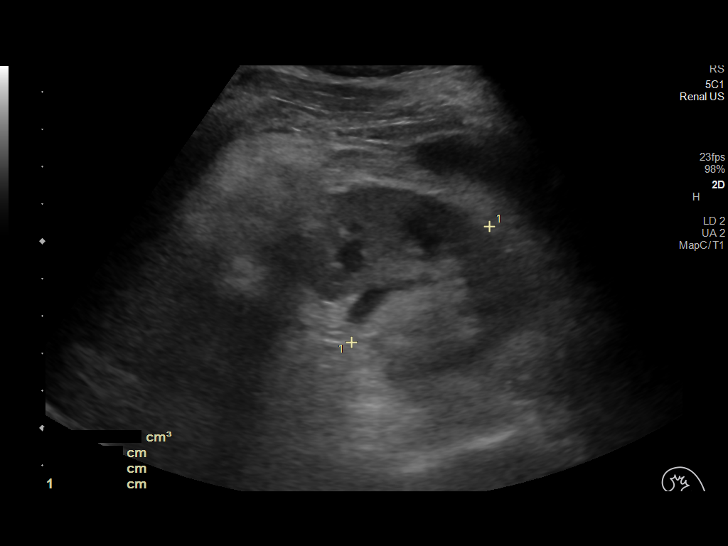
[im 34/41]
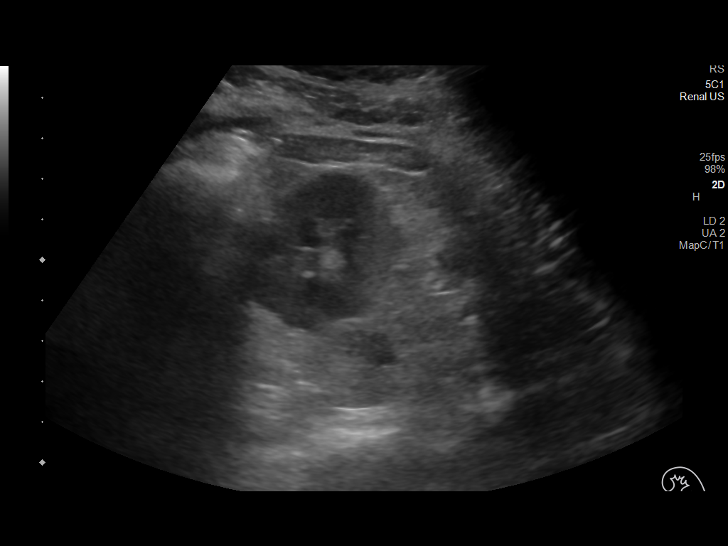
[im 37/41]
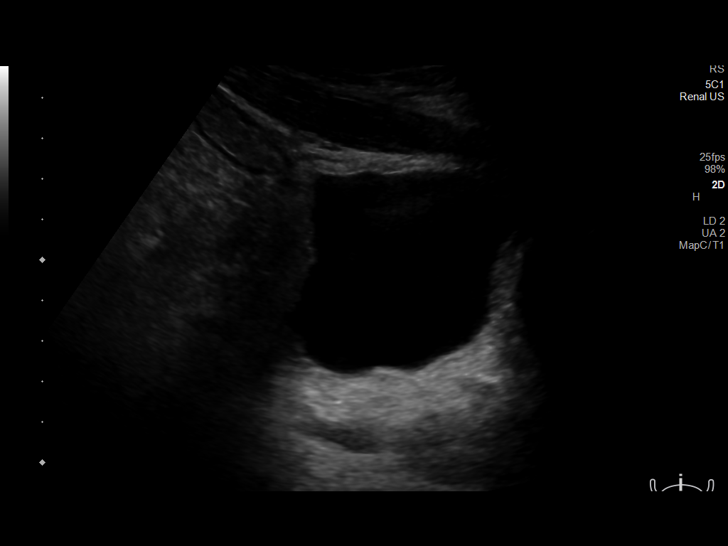
[im 41/41]
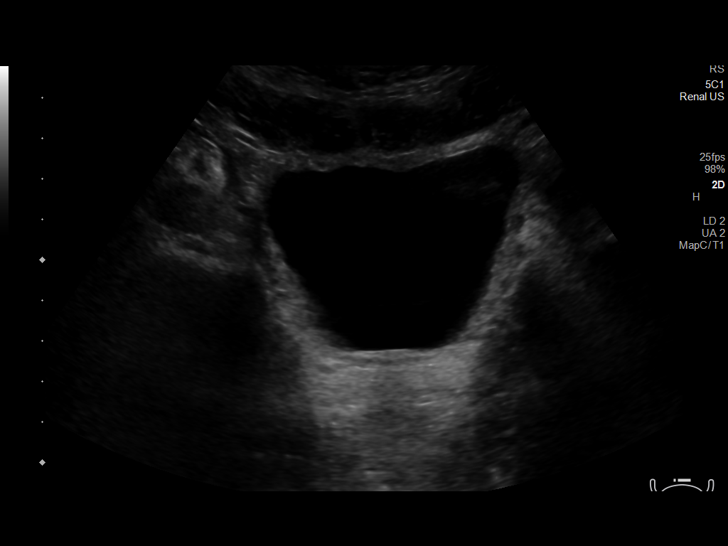

[14 of 25 positions shown; findings below may reference images not displayed]

FINDINGS: Right Kidney:

Renal measurements: 10.4 x 4.7 x 6.2 cm = volume: 156 mL.
Echogenicity within normal limits. No mass or hydronephrosis
visualized. Small volume perinephric fluid.

Left Kidney:

Renal measurements: 9.2 x 4.6 x 4.8 cm = volume: 107 mL.
Echogenicity within normal limits. No mass or hydronephrosis
visualized.

Bladder:

Appears normal for degree of bladder distention.

Other:

As previously seen, the liver is diffusely hyperechoic compatible
with steatosis. Ascites and a left pleural effusion are noted.
IMPRESSION: 1.  No hydronephrosis.
2. Hepatic steatosis.
3. Ascites and left pleural effusion.

## 2023-07-25 IMAGING — CR DG CHEST 1V
1 series · 1 of 1 positions shown · non-contrast
Comparison: Chest radiograph obtained 1 day prior

CLINICAL DATA: Hypoxia

EXAM:
CHEST  1 VIEW

[chest ap]
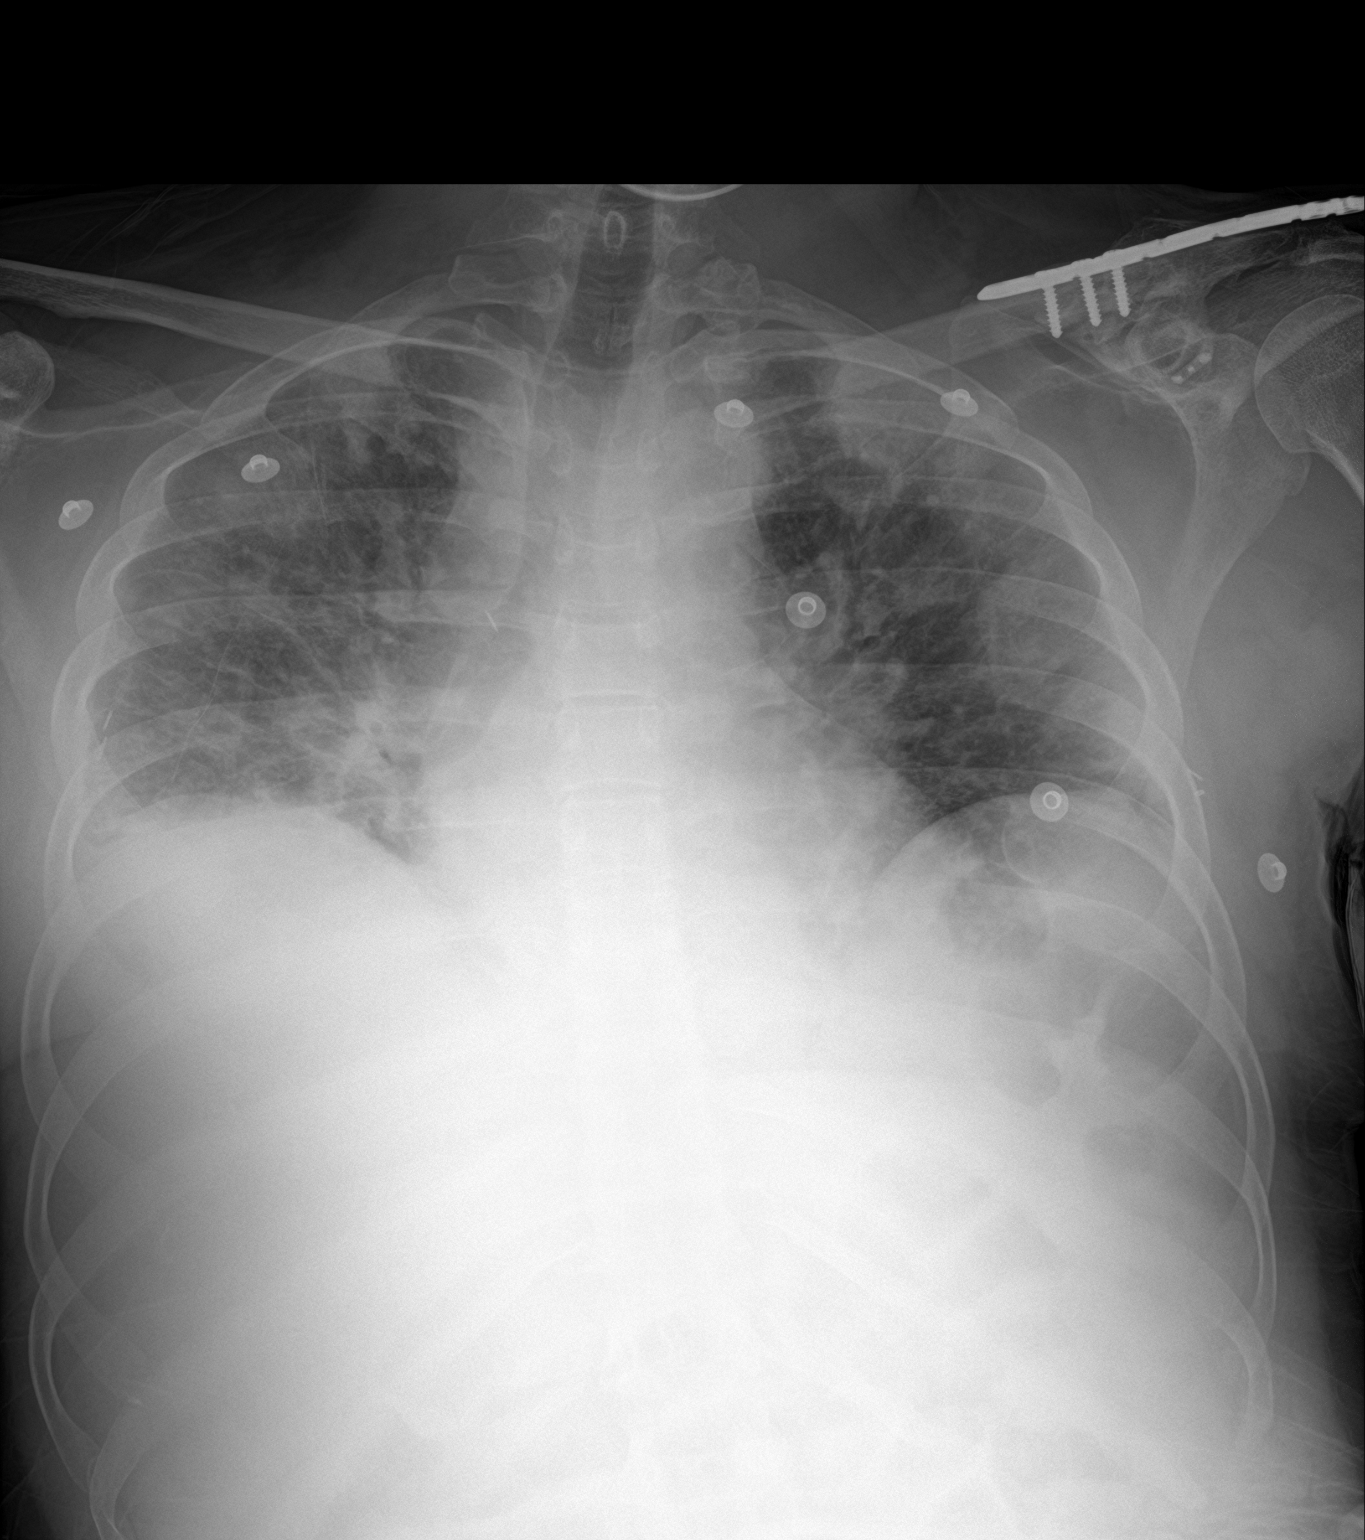

[1 of 1 positions shown; findings below may reference images not displayed]

FINDINGS: The heart and mediastinum are prominent, exaggerated by low lung
volumes and AP technique.

Lung volumes are markedly diminished, worsened since the radiograph
from 1 day prior. There are streaky opacities throughout both lungs
with more focal opacity in the medial right base. There is likely a
small right pleural effusion, new in the interim. There is no
significant left effusion. There is no appreciable pneumothorax.

Plate and screw fixation of the left clavicle is again seen. There
is no acute osseous abnormality.
IMPRESSION: Markedly low lung volumes with new streaky opacities bilaterally and
probable new small right pleural effusion. Findings suggest new
pulmonary interstitial edema given rapid onset; however, infection
could have a similar appearance.

## 2023-07-26 IMAGING — DX DG CHEST 1V PORT
1 series · 1 of 1 positions shown · non-contrast
Comparison: Film from earlier in the same day.

CLINICAL DATA: Status post intubation

EXAM:
PORTABLE CHEST 1 VIEW

[chest ap]
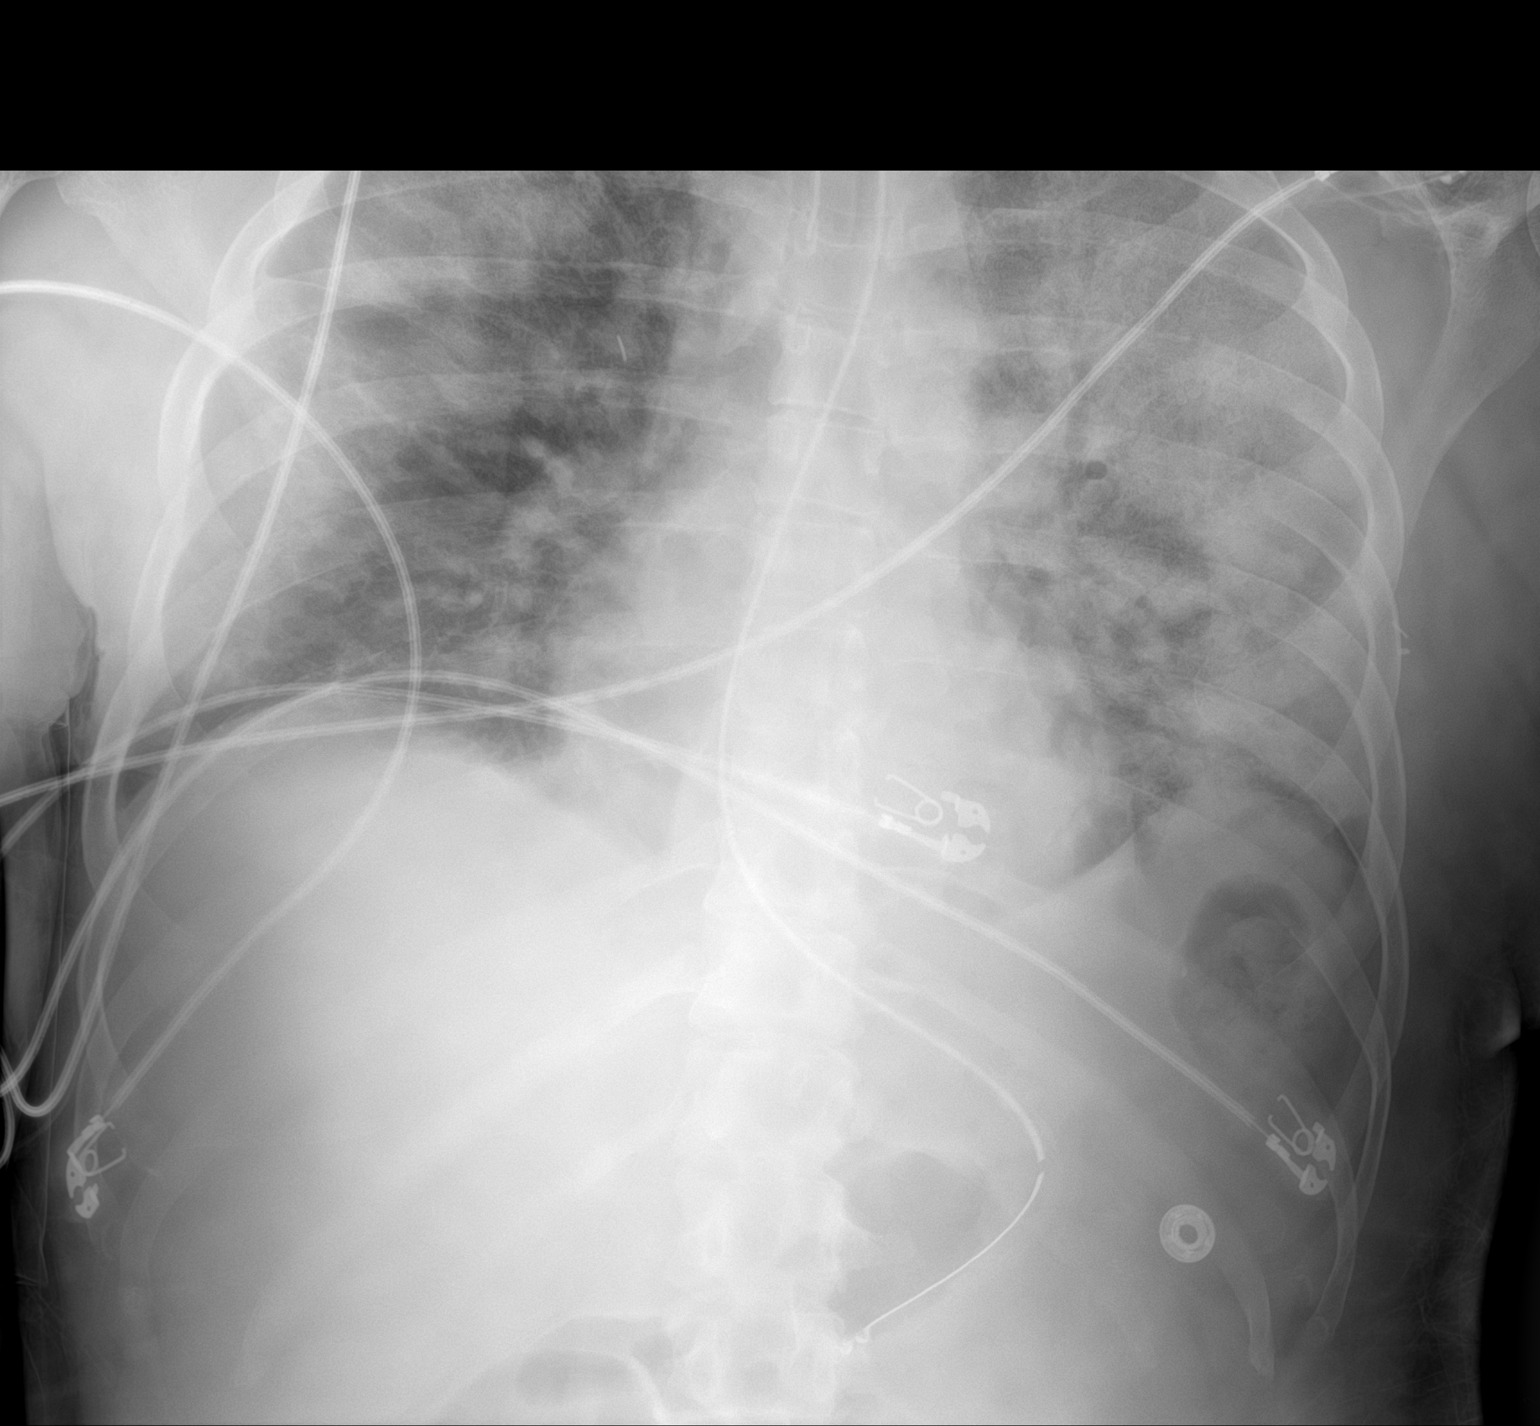

[1 of 1 positions shown; findings below may reference images not displayed]

FINDINGS: Cardiac shadow is stable. Persistent bilateral airspace opacities
are again identified. Endotracheal tube has been placed and lies 3
cm above the carina in satisfactory position. Gastric catheter
extends into the stomach. No bony abnormality is noted.
IMPRESSION: Stable bilateral airspace opacities.

Endotracheal tube and gastric catheter in satisfactory position.

## 2023-07-26 IMAGING — DX DG CHEST 1V PORT
1 series · 1 of 1 positions shown · non-contrast
Comparison: 02/20/2021

CLINICAL DATA: Respiratory failure

EXAM:
PORTABLE CHEST 1 VIEW

[chest ap]
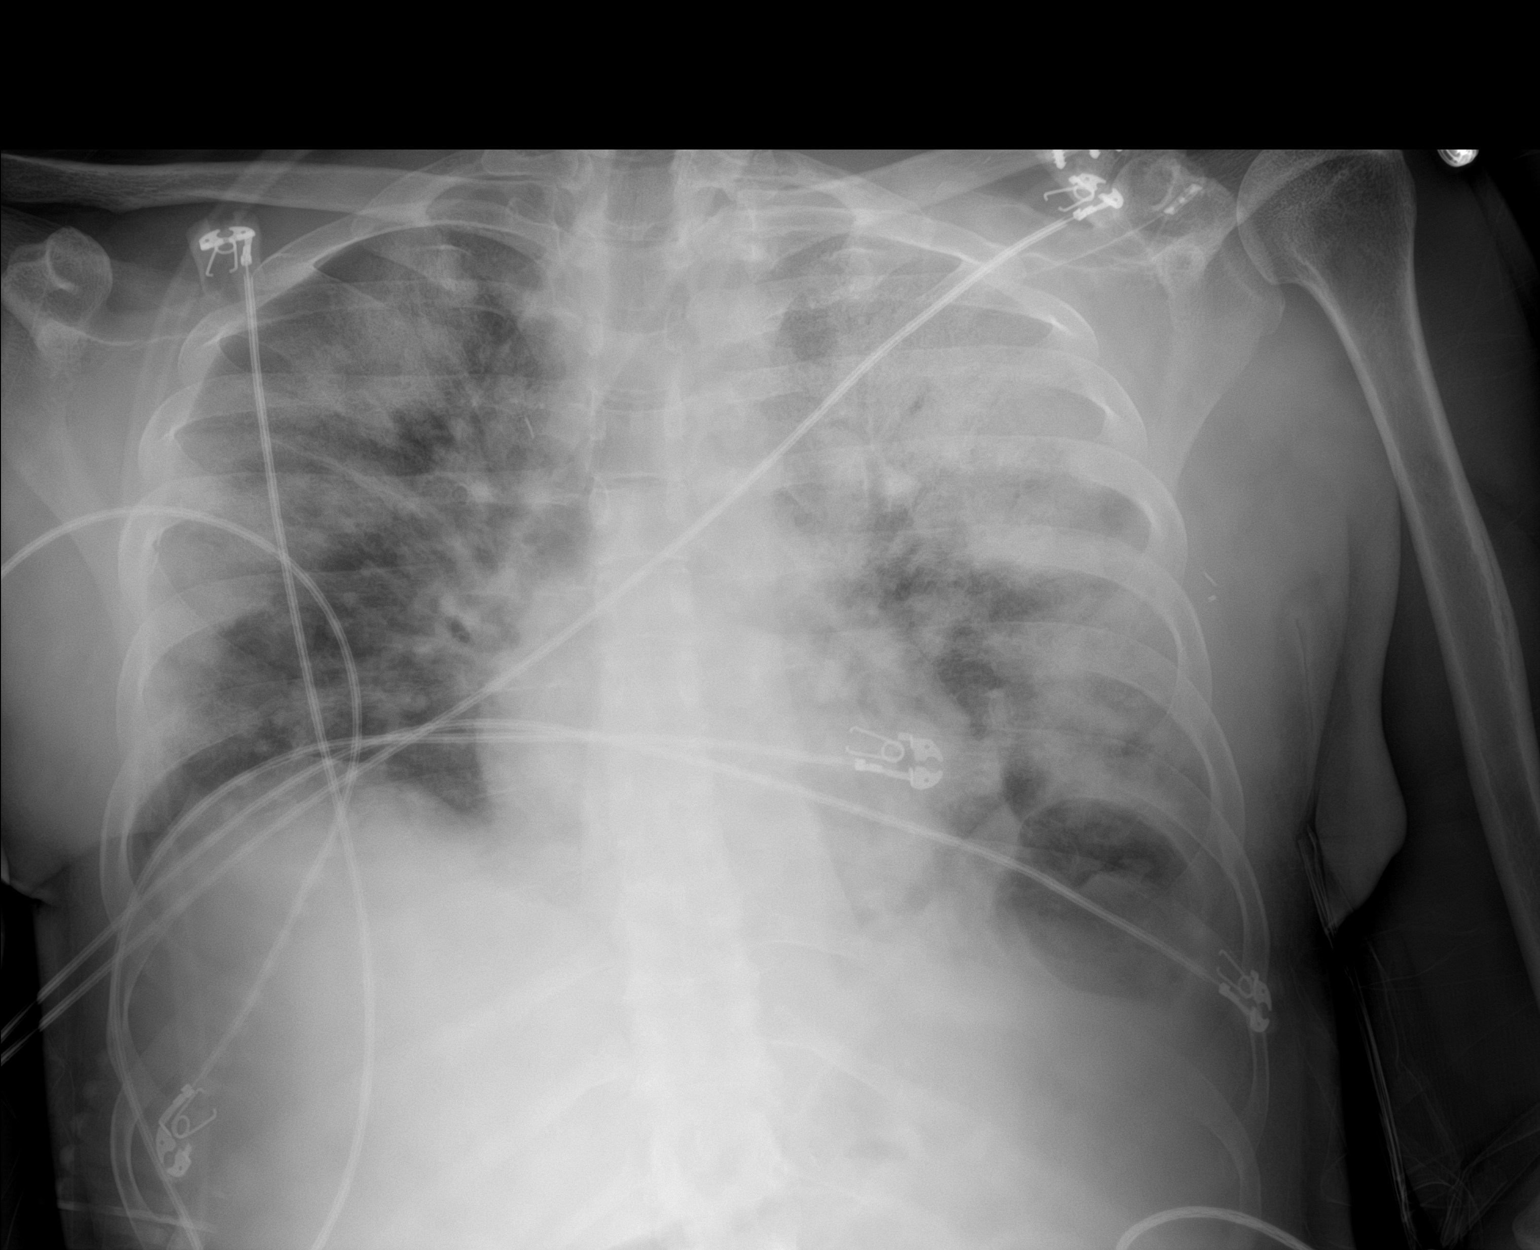

[1 of 1 positions shown; findings below may reference images not displayed]

FINDINGS: Cardiac shadow is stable. Significant increased airspace opacity is
noted bilaterally left greater than right when compared with the
prior exam. Again this likely represents edema. No bony abnormality
is noted. Prior surgical changes in the left clavicle are seen.
IMPRESSION: Significant increase in airspace opacity bilaterally left greater
than right consistent with progressive edema.

## 2023-07-26 IMAGING — DX DG CHEST 1V PORT
1 series · 1 of 1 positions shown · non-contrast
Comparison: 02/21/2021

CLINICAL DATA: Central venous catheter placement

EXAM:
PORTABLE CHEST 1 VIEW

[chest ap]
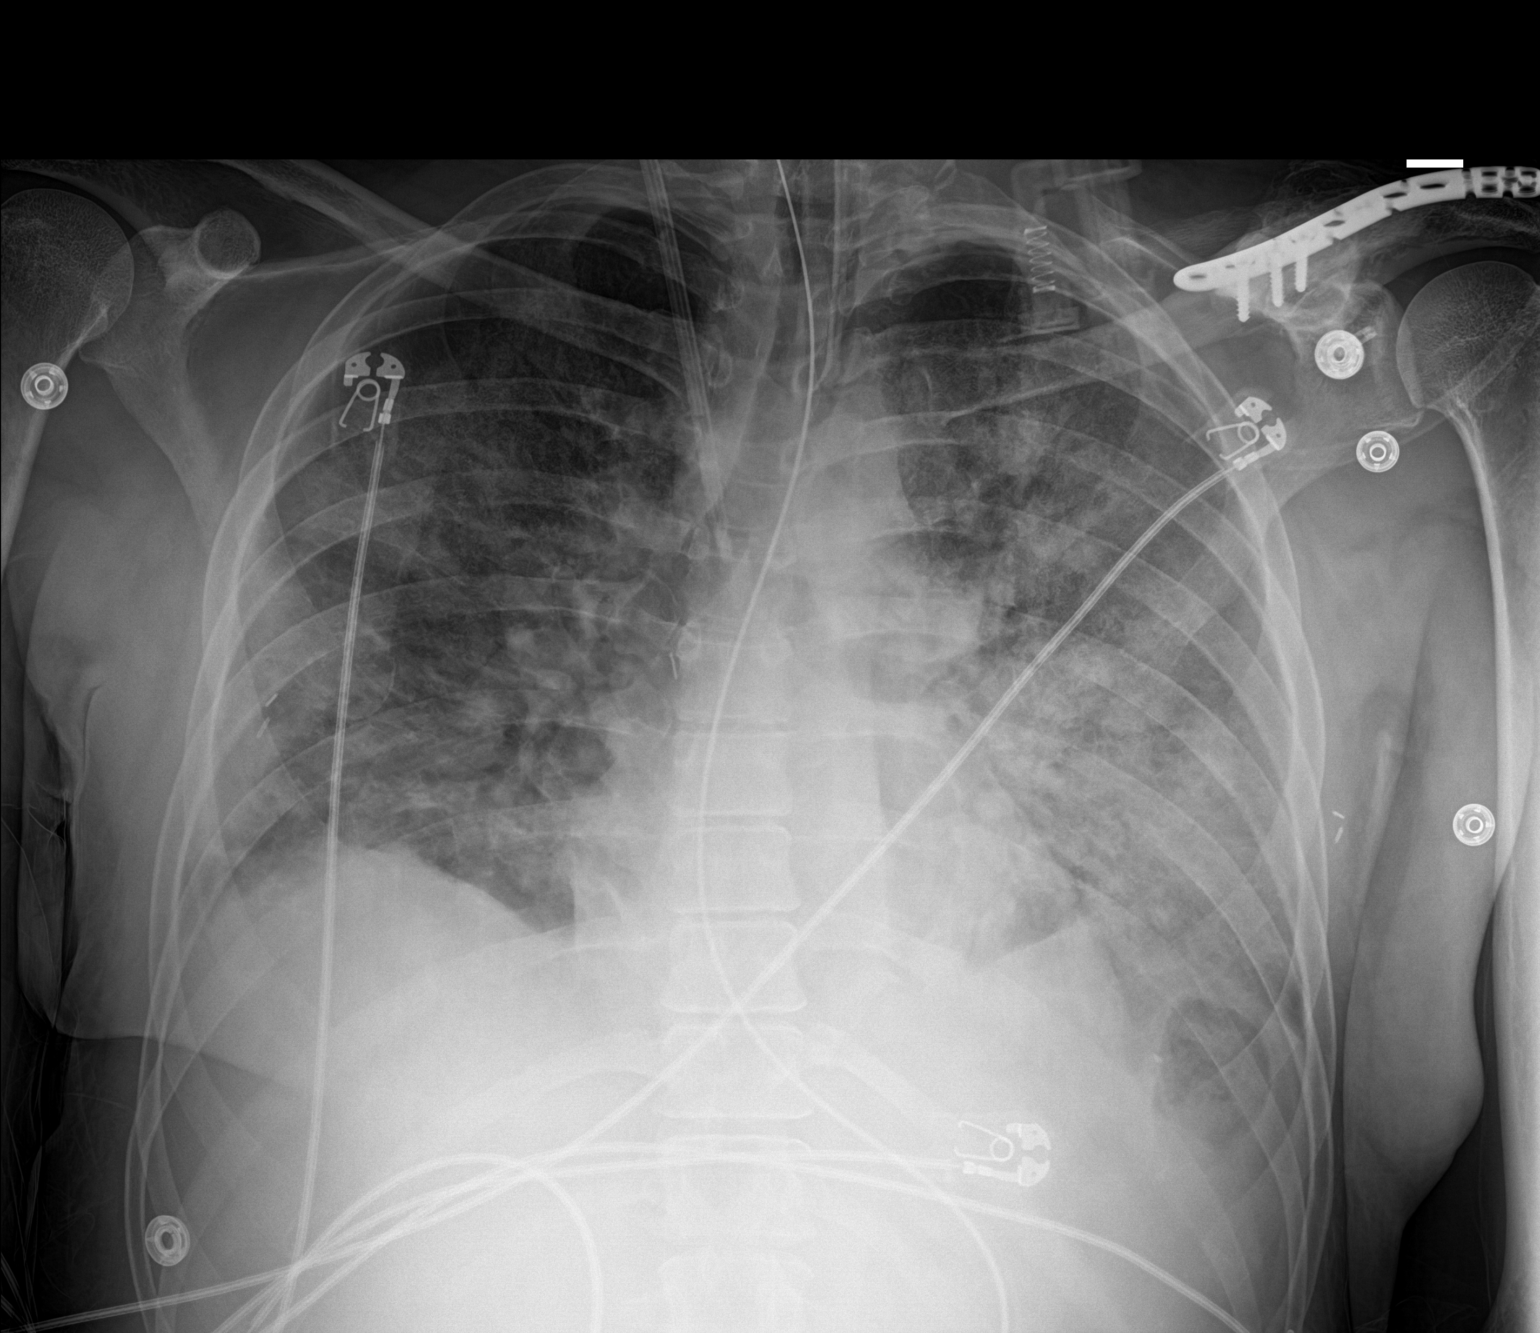

[1 of 1 positions shown; findings below may reference images not displayed]

FINDINGS: Endotracheal tube is seen 4.6 cm above the carina. Nasogastric tube
extends into the upper abdomen beyond the margin of the examination.
Right internal jugular central venous catheter has been placed with
its tip overlying the expected superior vena cava medially.

Pulmonary insufflation is preserved. Extensive diffuse airspace
infiltrate, more severe throughout the left lung persists,
asymmetric pulmonary edema versus infection. No pneumothorax or
pleural effusion. Cardiac size within normal limits. No acute bone
abnormality. Surgical clips are again seen bilaterally, unchanged.
IMPRESSION: Right internal jugular central venous catheter tip within the
superior vena cava. No pneumothorax.

Otherwise stable support tubes.

Stable pulmonary insufflation. Unchanged diffuse pulmonary
infiltrate, infection versus edema.

## 2023-07-27 IMAGING — DX DG CHEST 1V PORT
1 series · 1 of 1 positions shown · non-contrast
Comparison: February 21, 2021.

CLINICAL DATA: Acute respiratory failure with hypoxemia (HCC)
9J3.26 ([id number])

EXAM:
PORTABLE CHEST 1 VIEW

[chest ap]
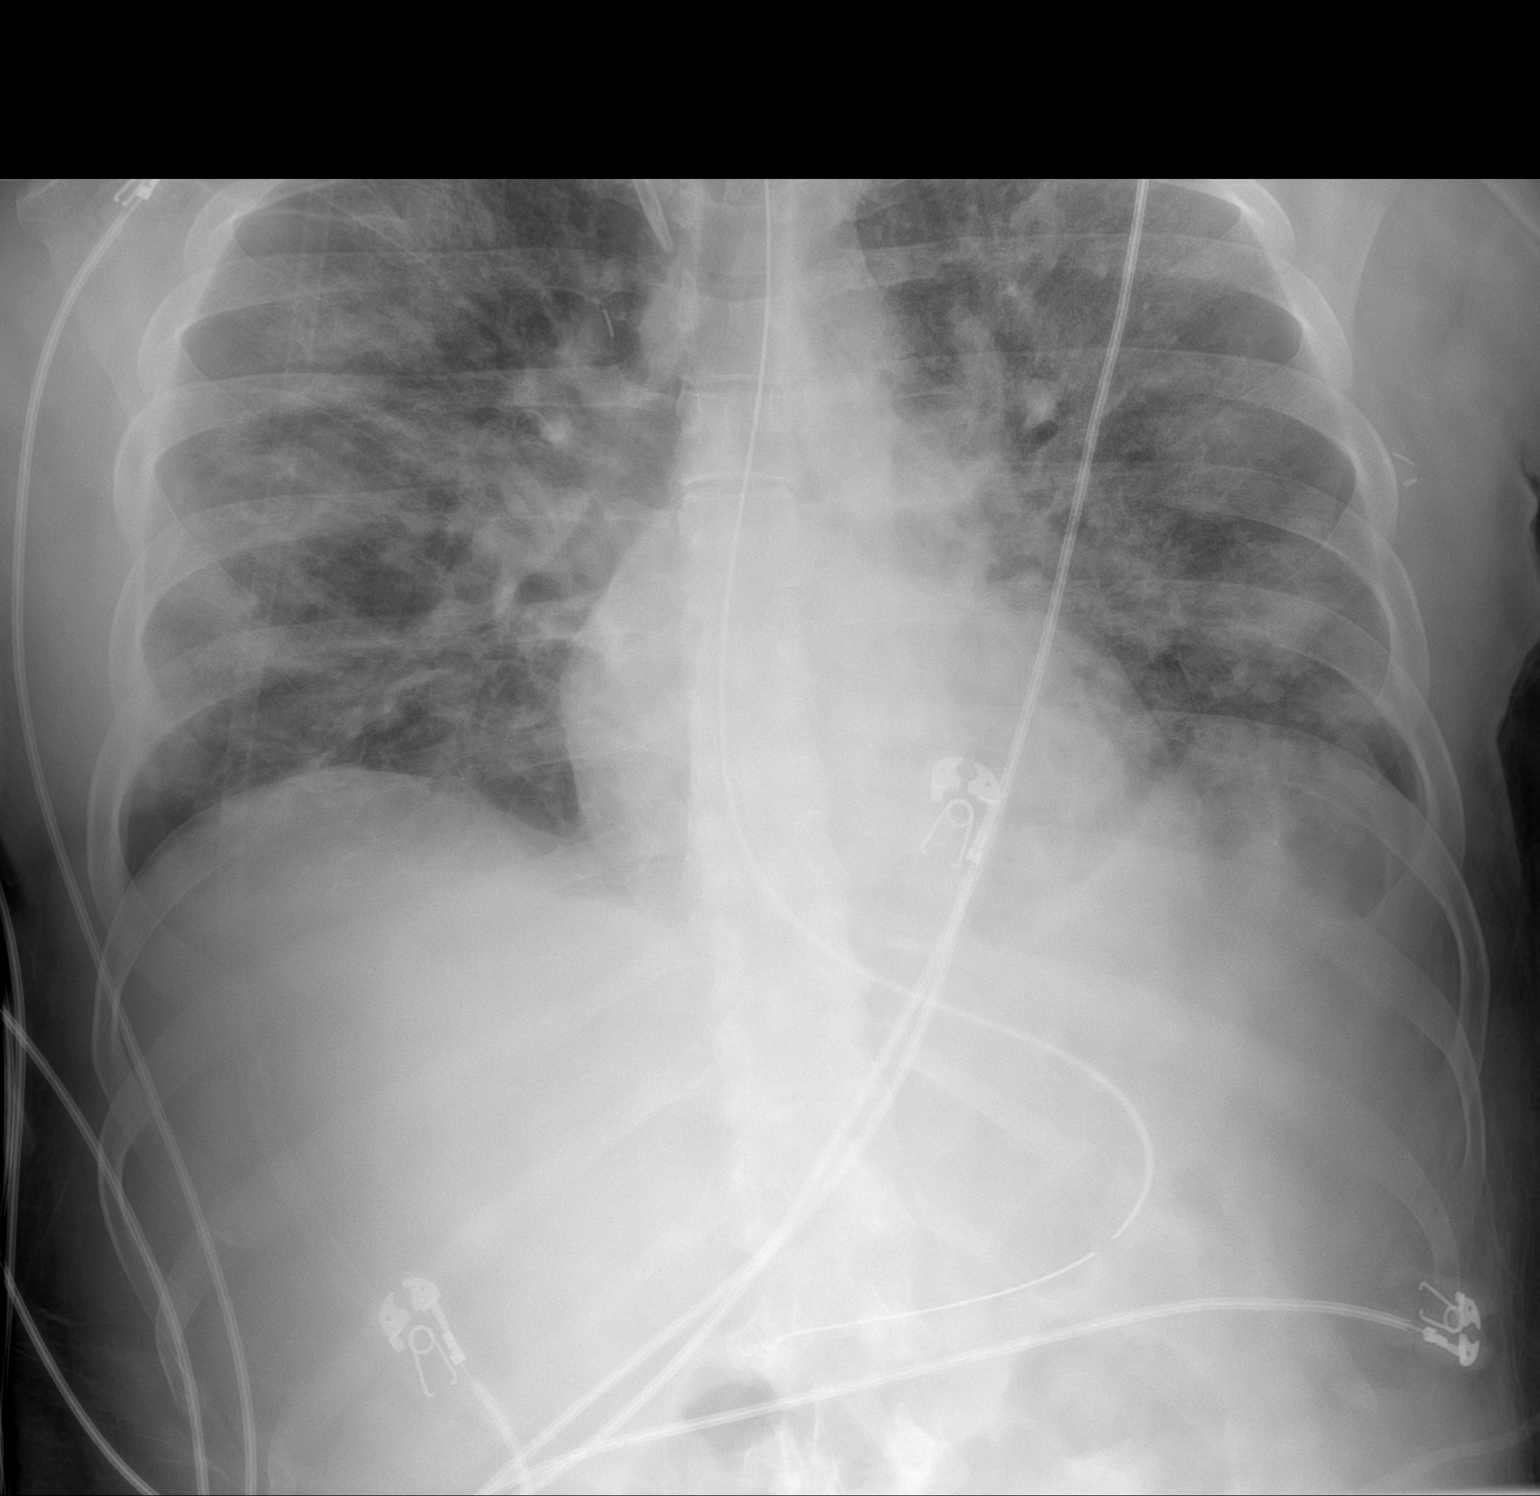

[1 of 1 positions shown; findings below may reference images not displayed]

FINDINGS: Endotracheal tube tip projects just inferior to the clavicular
heads. Right IJ sheath with the tip projecting in the expected
location of the upper SVC. Gastric tube courses below the diaphragm
with the tip projecting in expected region of the distal stomach and
side port below the GE junction. Mildly improved lung aeration with
patchy by lateral airspace opacities. No visible pleural effusions
or pneumothorax on this limited single semi erect radiograph. Left
axillary clips. Cardiomediastinal silhouette is within normal limits
and similar to prior.
IMPRESSION: 1. Mildly improved lung aeration with patchy bilateral airspace
opacities, which could represent edema and/or pneumonia.
2. Support devices detailed above.

## 2023-07-28 IMAGING — DX DG CHEST 1V PORT
1 series · 1 of 1 positions shown · non-contrast
Comparison: 02/22/2021

CLINICAL DATA: Acute respiratory failure with hypoxemia.

EXAM:
PORTABLE CHEST 1 VIEW

[chest ap]
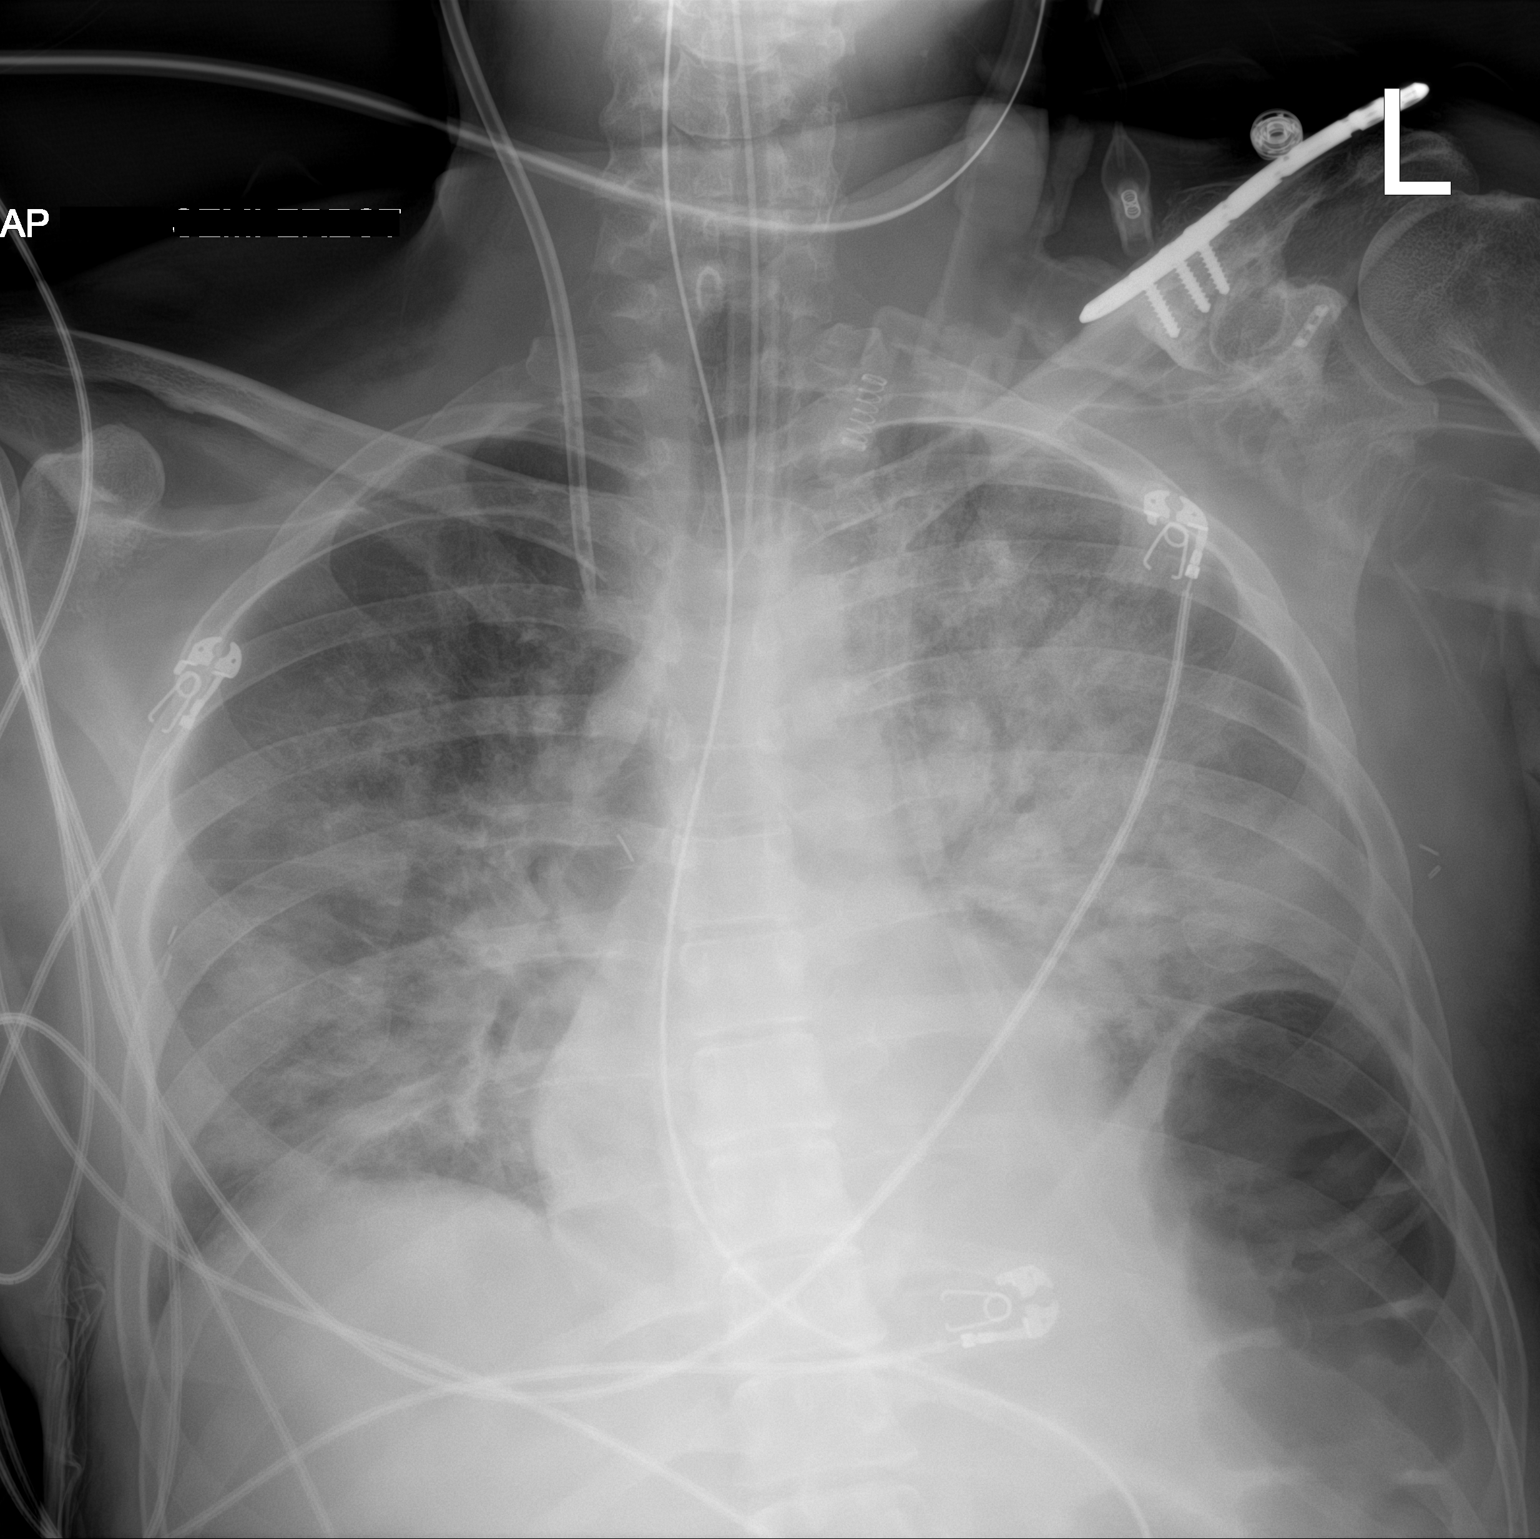

[1 of 1 positions shown; findings below may reference images not displayed]

FINDINGS: Endotracheal tube is 4.7 cm above the carina. Right jugular central
line is near the junction of the right internal jugular vein and
right innominate vein and stable. Nasogastric tube extends in the
into the abdomen but the tip is beyond the image. Again noted is
bilateral airspace disease which has progressed, particularly in the
left lung. Heart size is within normal limits and stable. Negative
for pneumothorax. Surgical plate involving the left clavicle.
IMPRESSION: 1. Increased bilateral airspace disease, particularly on the left
side.
2. Support apparatuses have minimally changed.

## 2023-07-29 IMAGING — DX DG ABDOMEN 1V
2 series · 2 of 2 positions shown · non-contrast
Comparison: 01/03/2021

CLINICAL DATA: Abdominal distension

EXAM:
ABDOMEN - 1 VIEW

[abdomen supine (1 of 2)]
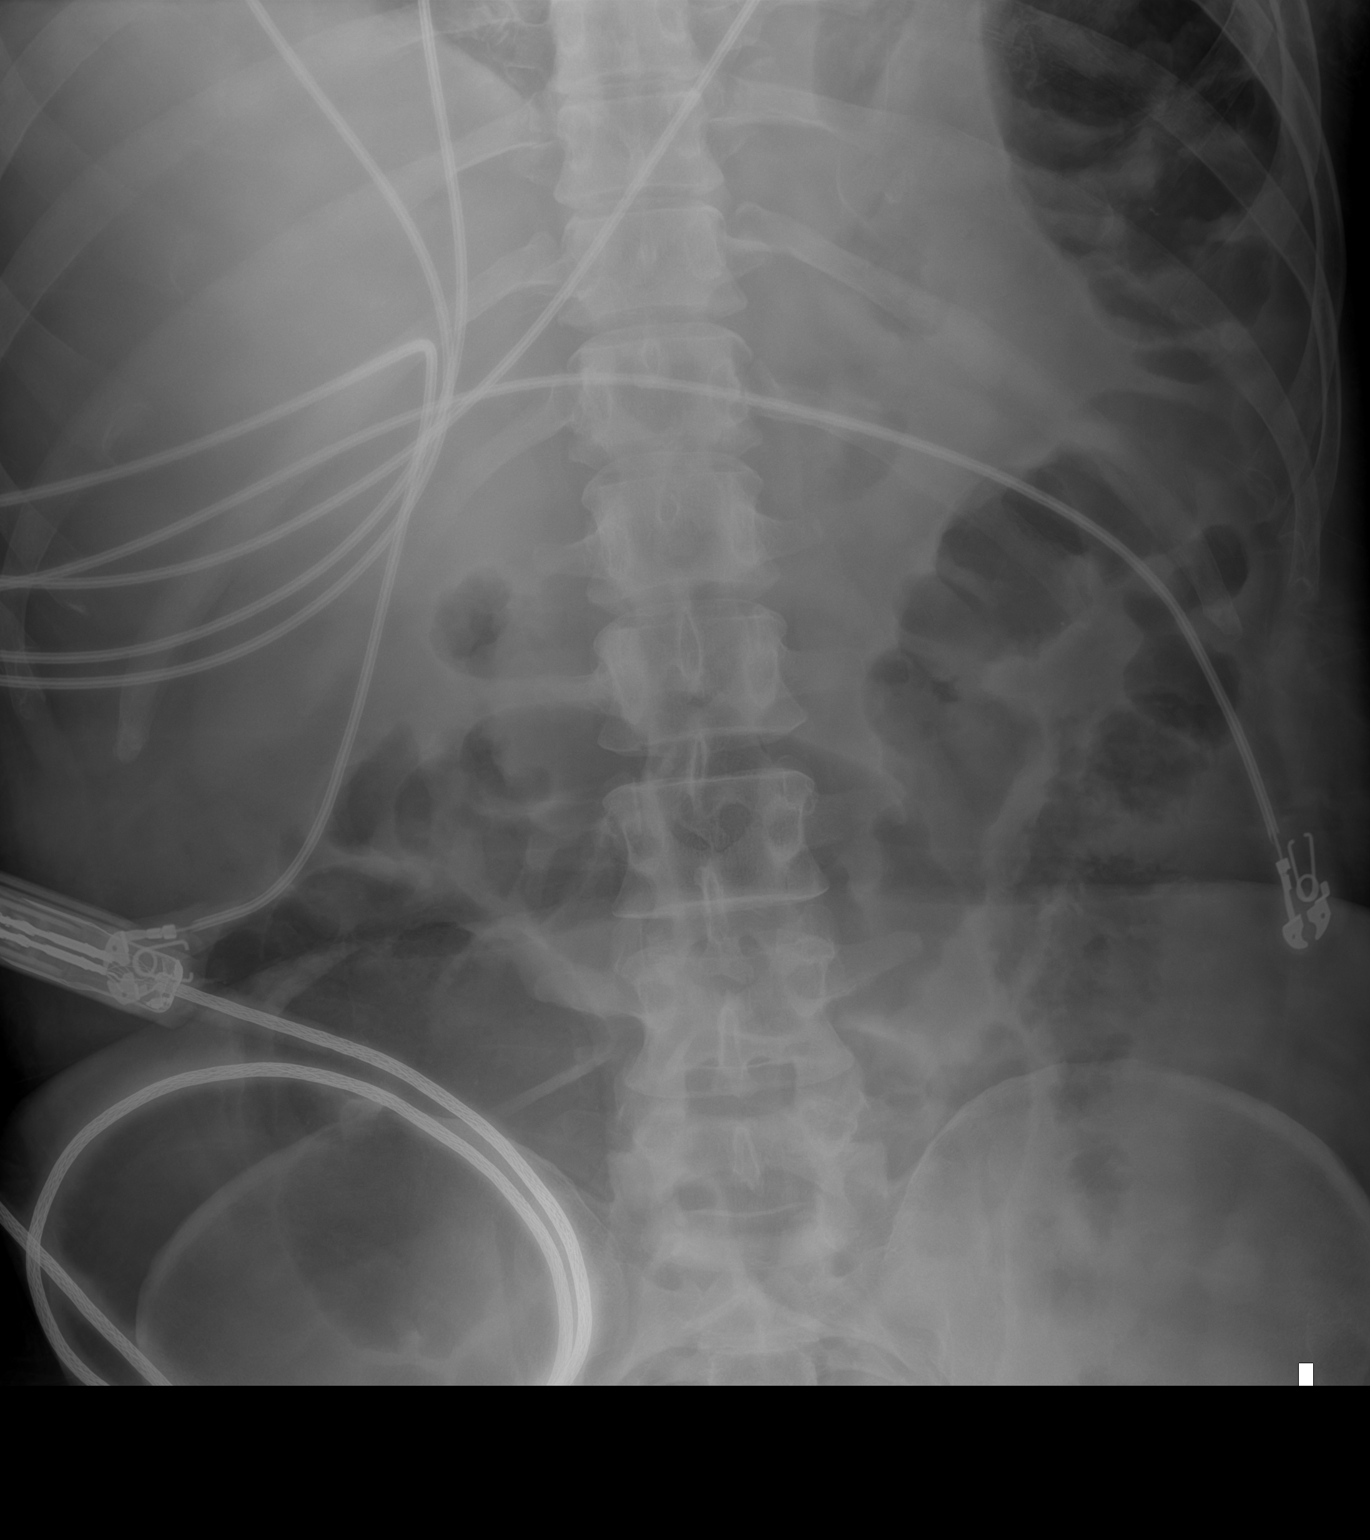

[abdomen supine (2 of 2)]
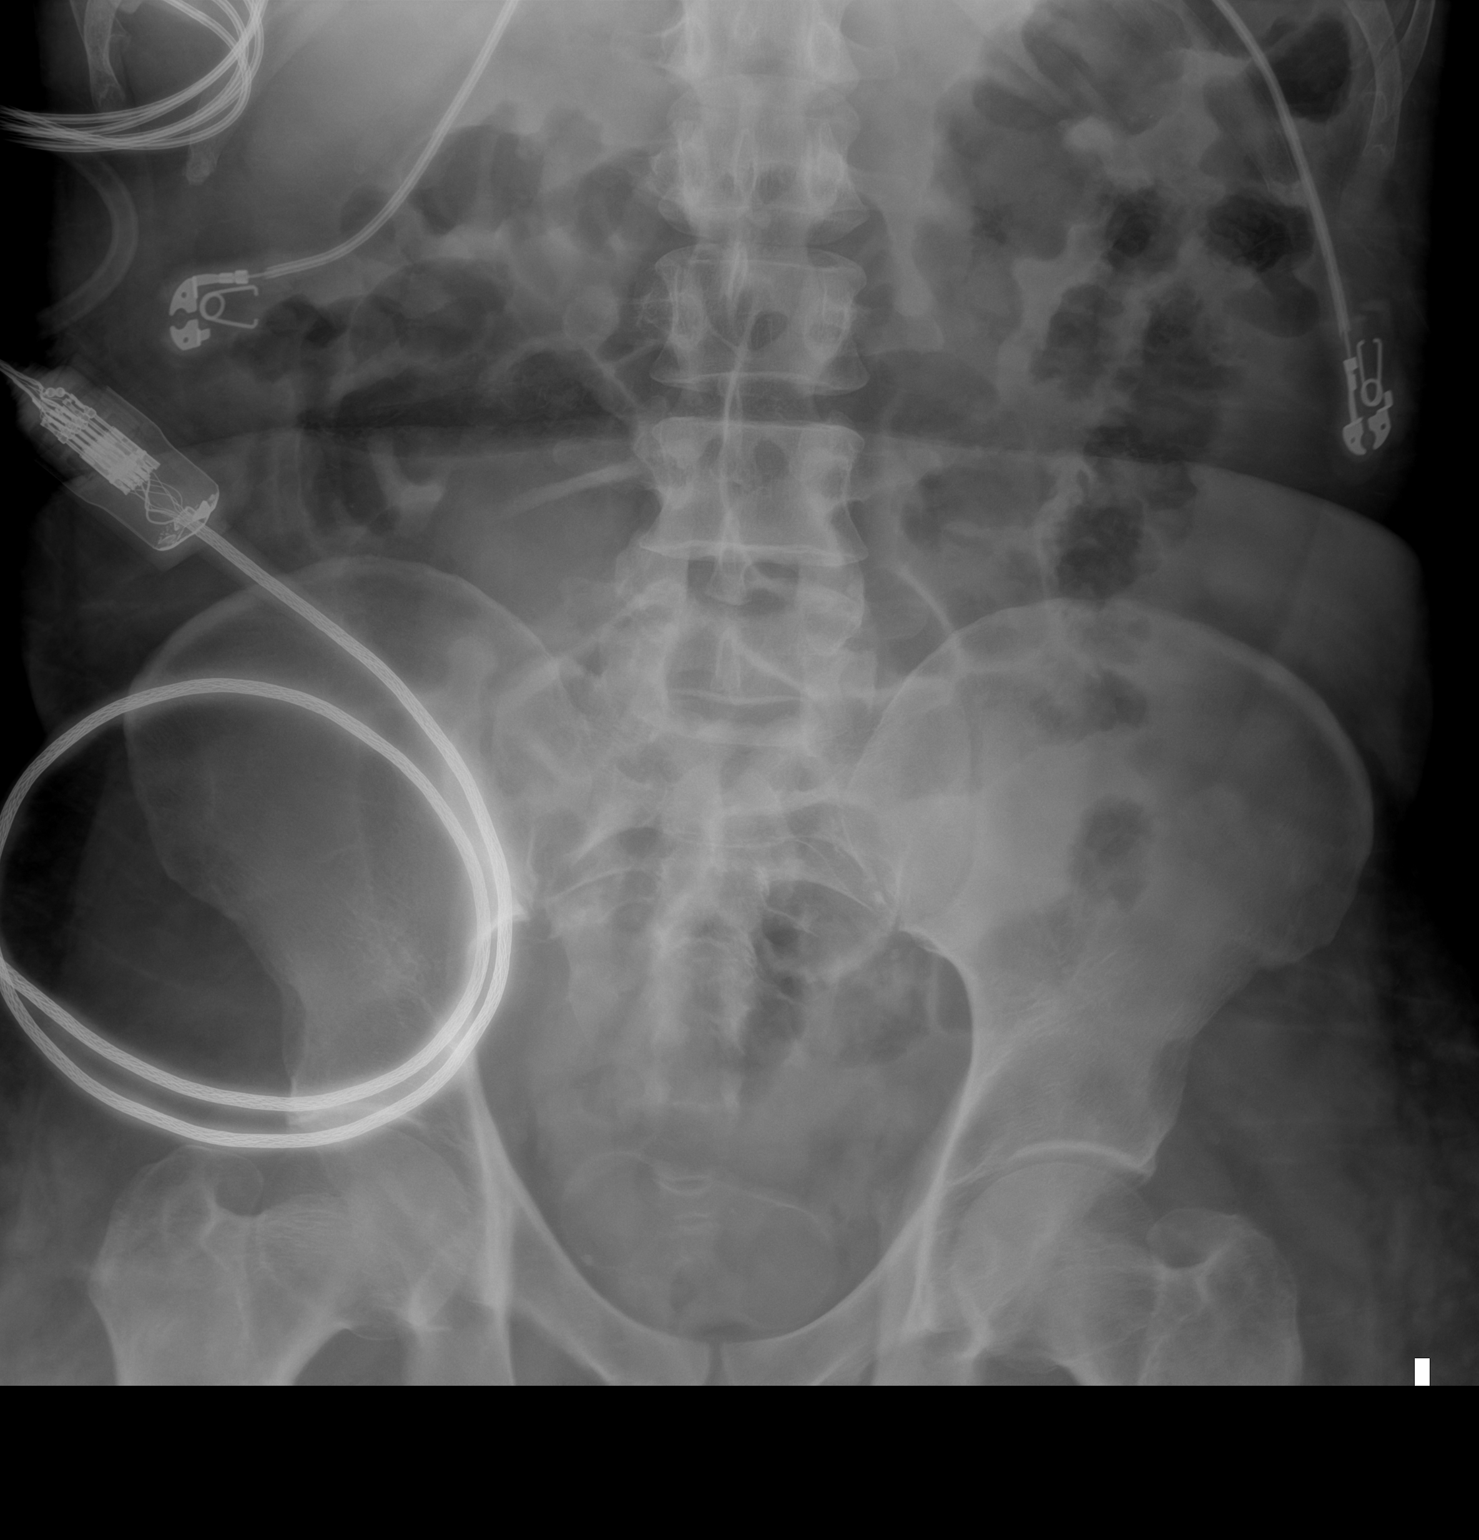

[2 of 2 positions shown; findings below may reference images not displayed]

FINDINGS: Scattered large and small bowel gas is noted. No free air is seen.
No obstructive changes are noted. No bony abnormality is seen.
IMPRESSION: No acute abnormality in the abdomen.

## 2023-07-30 IMAGING — DX DG ABD PORTABLE 1V
1 series · 1 of 1 positions shown · non-contrast
Comparison: 02/24/2021

CLINICAL DATA: Feeding tube placement

EXAM:
PORTABLE ABDOMEN - 1 VIEW

[abdomen]
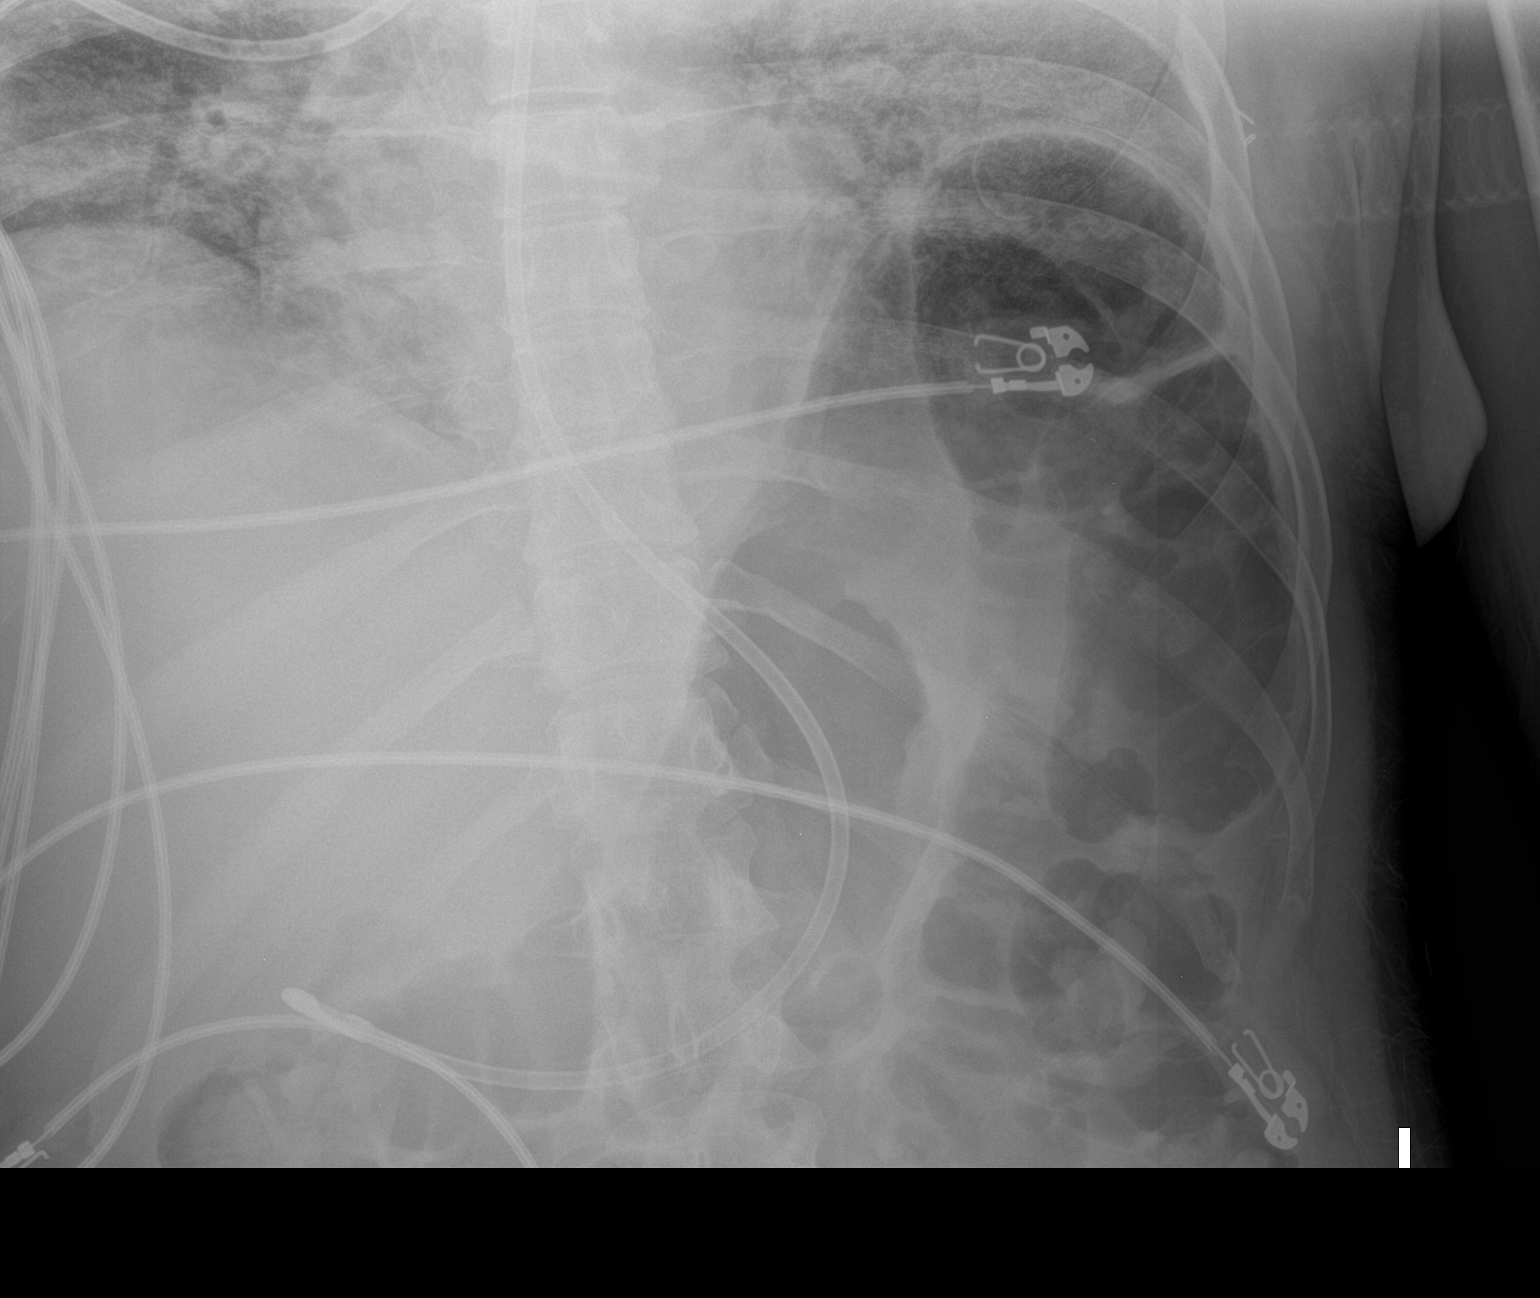

[1 of 1 positions shown; findings below may reference images not displayed]

FINDINGS: Feeding tube passes through the stomach with the tip near the
pylorus. Mild colonic distention compatible with ileus.
IMPRESSION: Feeding tube tip near the pylorus.

## 2023-07-30 IMAGING — DX DG CHEST 1V PORT
2 series · 2 of 2 positions shown · non-contrast
Comparison: 02/25/2021

CLINICAL DATA: Chest pain short of breath.  Intubation

EXAM:
PORTABLE CHEST 1 VIEW

[chest ap (1 of 2)]
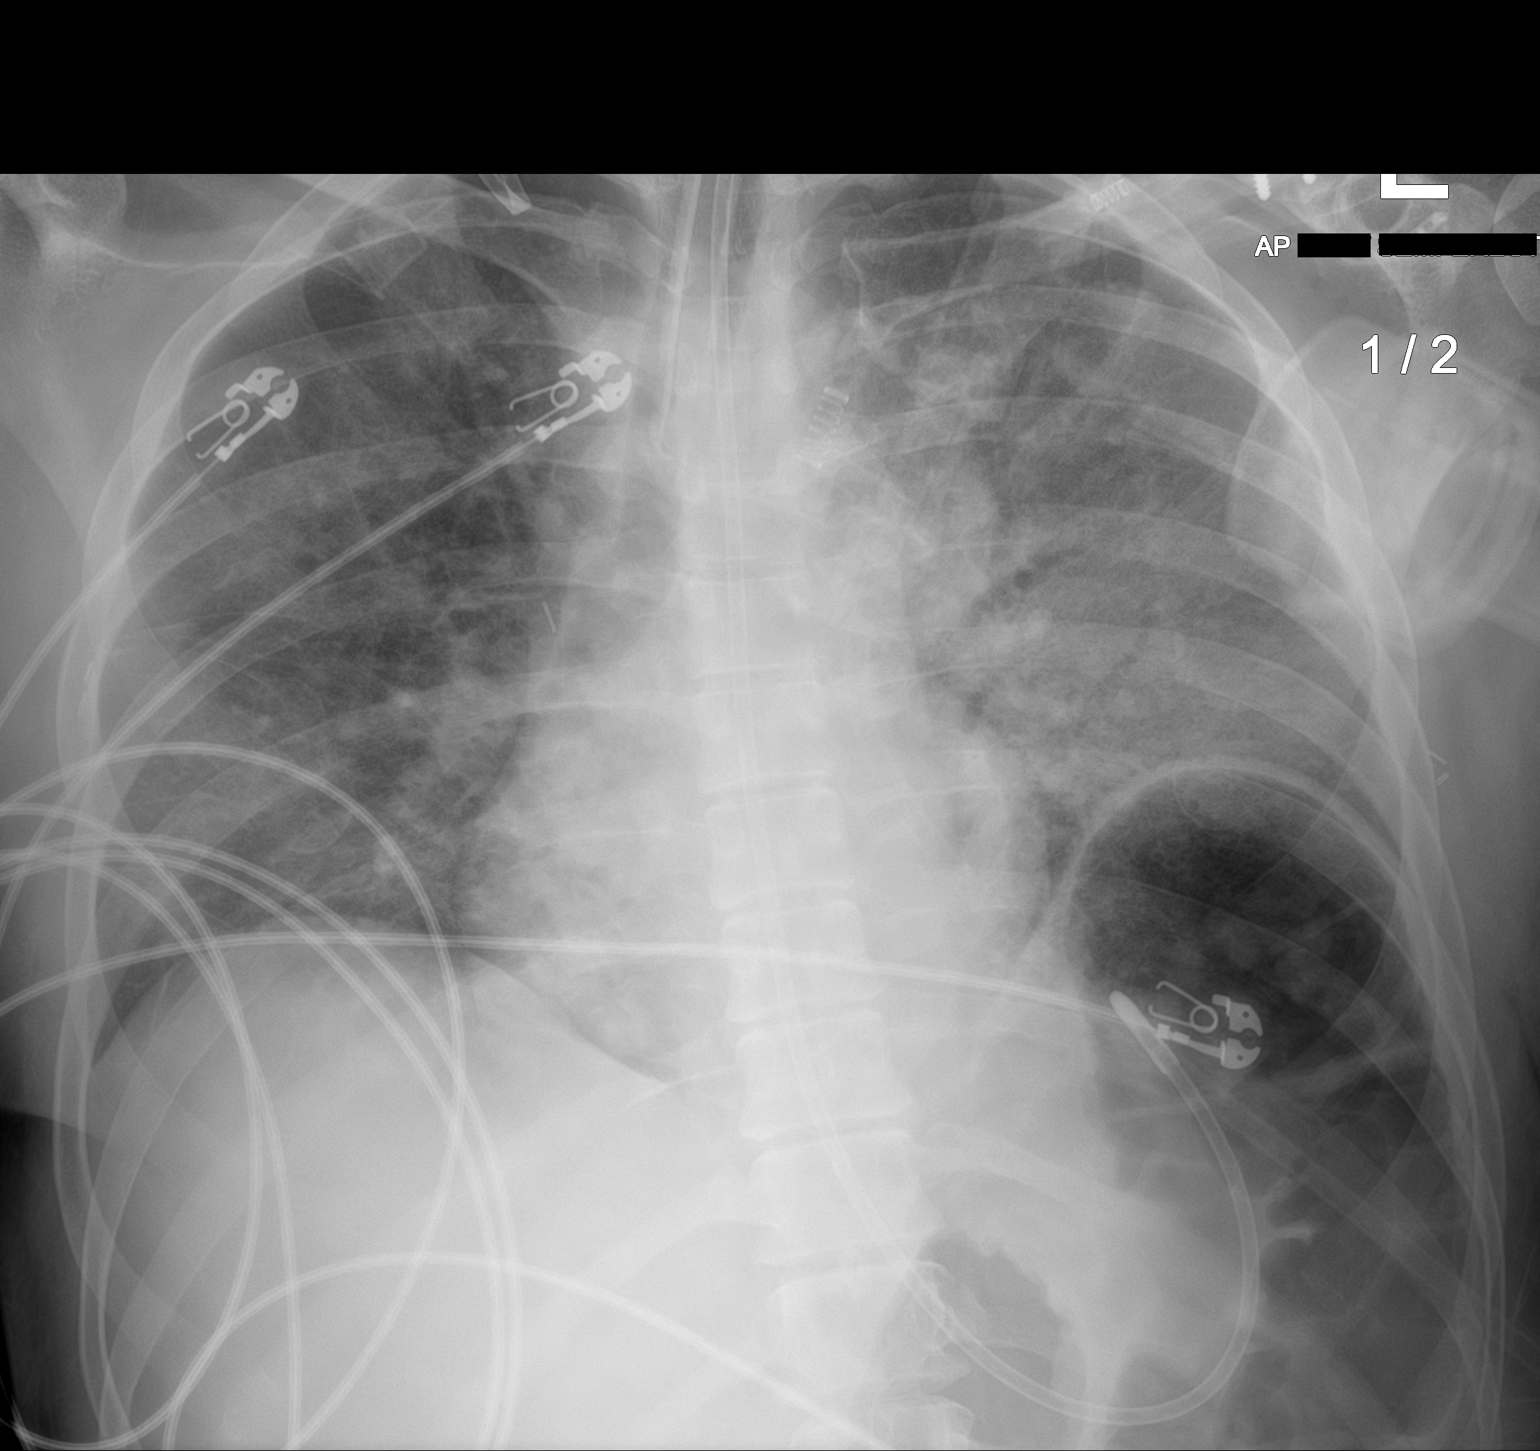

[chest ap (2 of 2)]
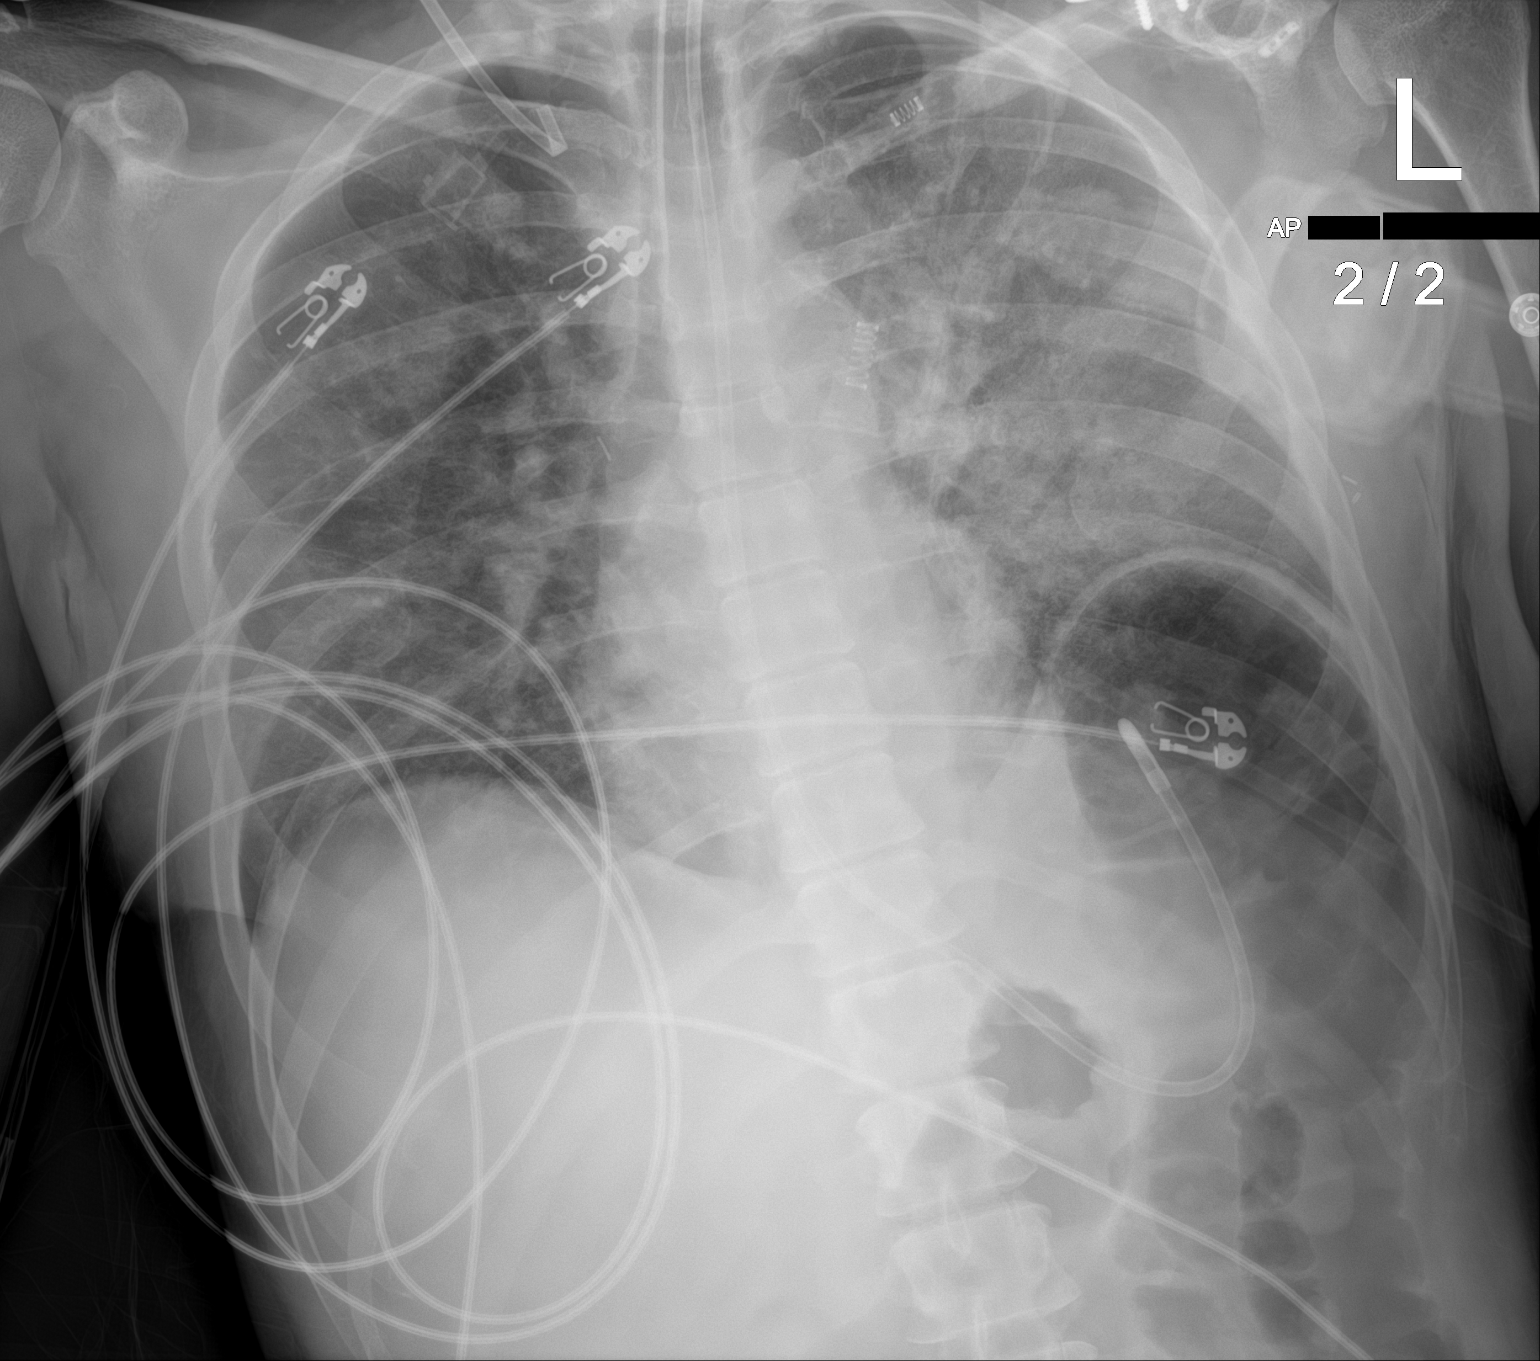

[2 of 2 positions shown; findings below may reference images not displayed]

FINDINGS: Interval placement of endotracheal tube with the tip 3 cm above the
carina. Interval placement of feeding tube with the tip in the
gastric fundus.

Extensive bilateral airspace disease left greater than right is
unchanged. No effusion.

There is kinked tubing overlying the right lung apex question
vascular catheter placement. No pneumothorax.

Distended left colon compatible with ileus.
IMPRESSION: Endotracheal tube in good position. Feeding tube in the gastric
fundus

Extensive bilateral airspace disease left greater than right
unchanged from earlier today

Adynamic ileus.

## 2023-07-30 IMAGING — DX DG CHEST 1V PORT
1 series · 1 of 1 positions shown · non-contrast
Comparison: 02/23/2021

CLINICAL DATA: Hypoxia.

EXAM:
PORTABLE CHEST 1 VIEW

[chest]
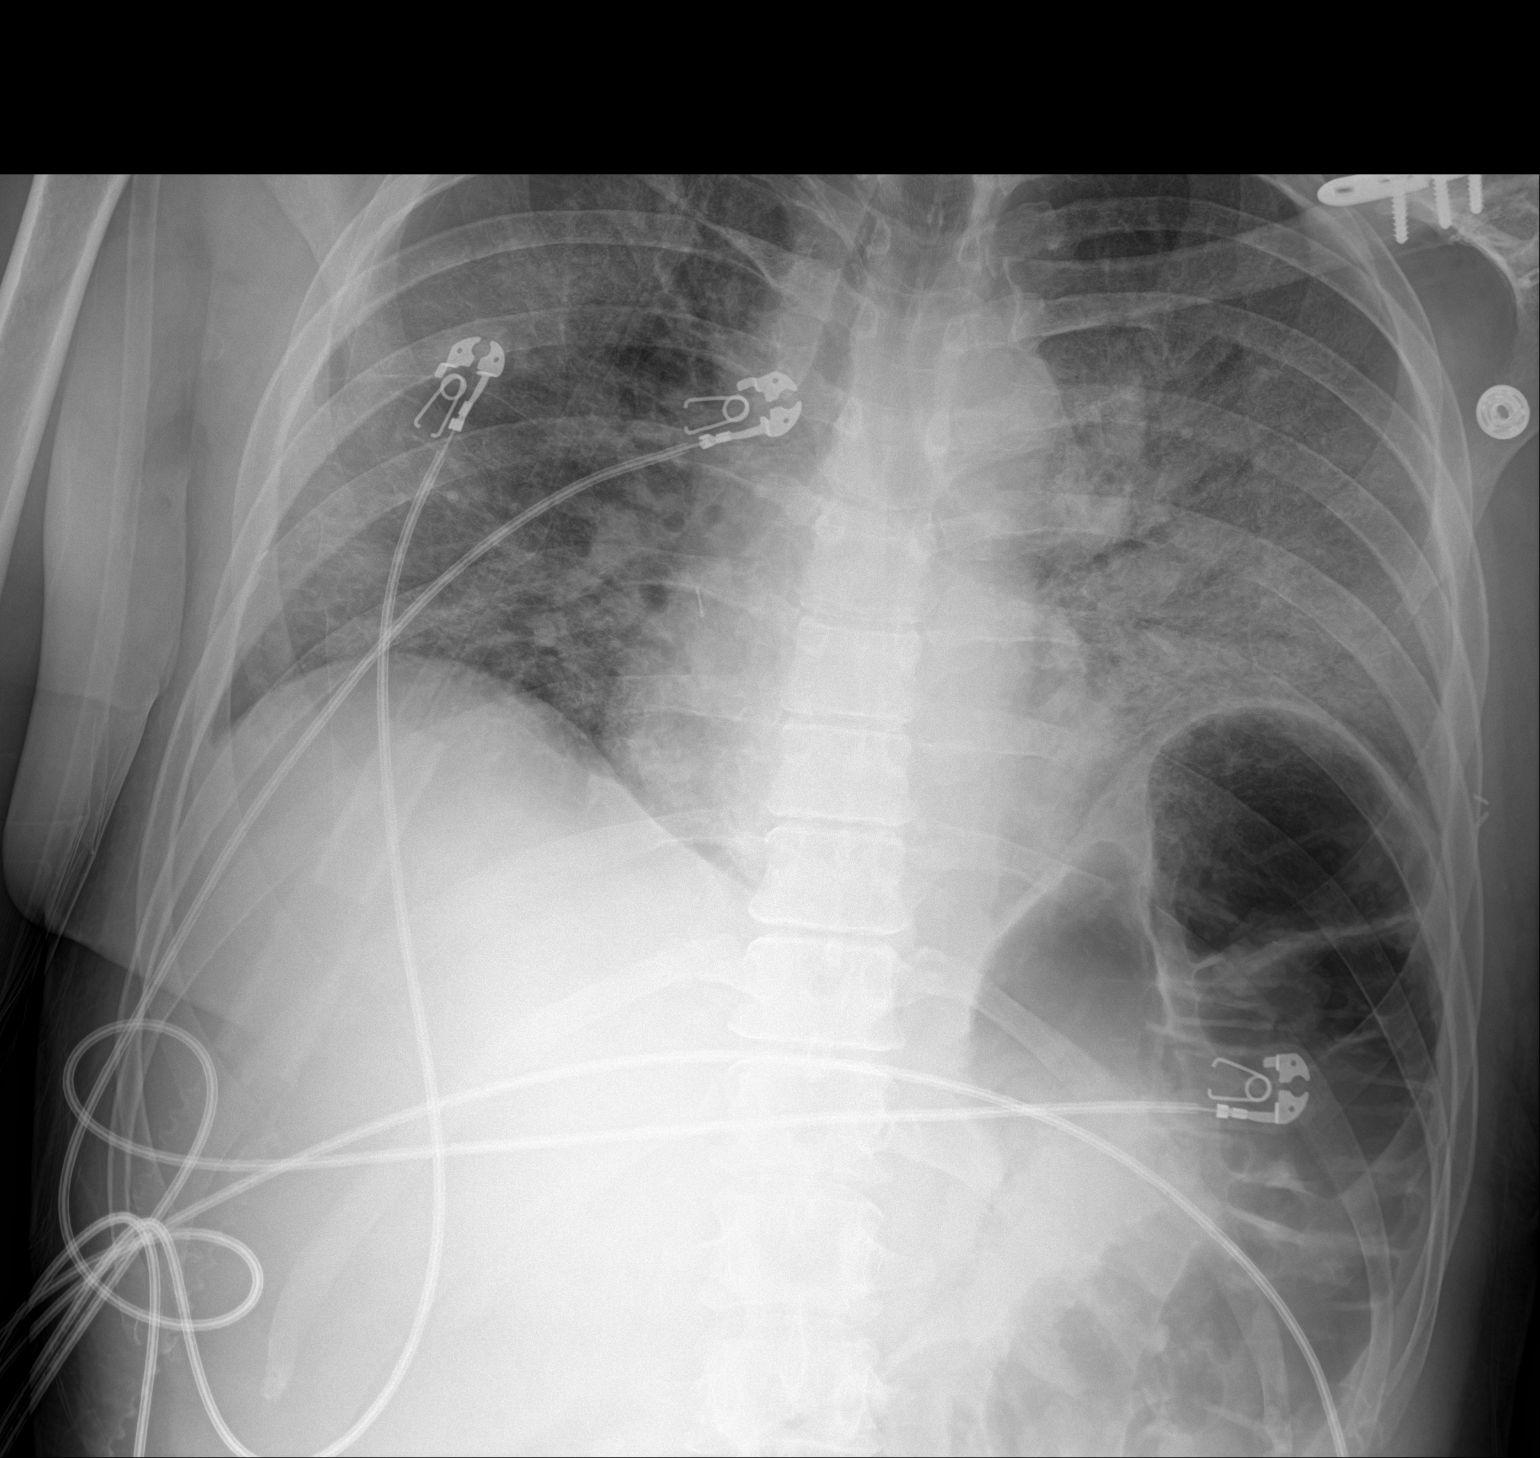

[1 of 1 positions shown; findings below may reference images not displayed]

FINDINGS: Interval removal of ET tube, NG tube and right IJ catheter. Stable
cardiomediastinal contours. Low lung volumes. Persistent, but
improved bilateral interstitial and airspace opacities.
IMPRESSION: 1. No complications status post removal of support apparatus.
2. Persistent but improved bilateral hazy lung opacities.

## 2023-07-31 IMAGING — DX DG ABD PORTABLE 1V
1 series · 1 of 1 positions shown · non-contrast
Comparison: 2844 hours today.

CLINICAL DATA: 39-year-old male OG tube placement.

EXAM:
PORTABLE ABDOMEN - 1 VIEW

[abdomen supine]
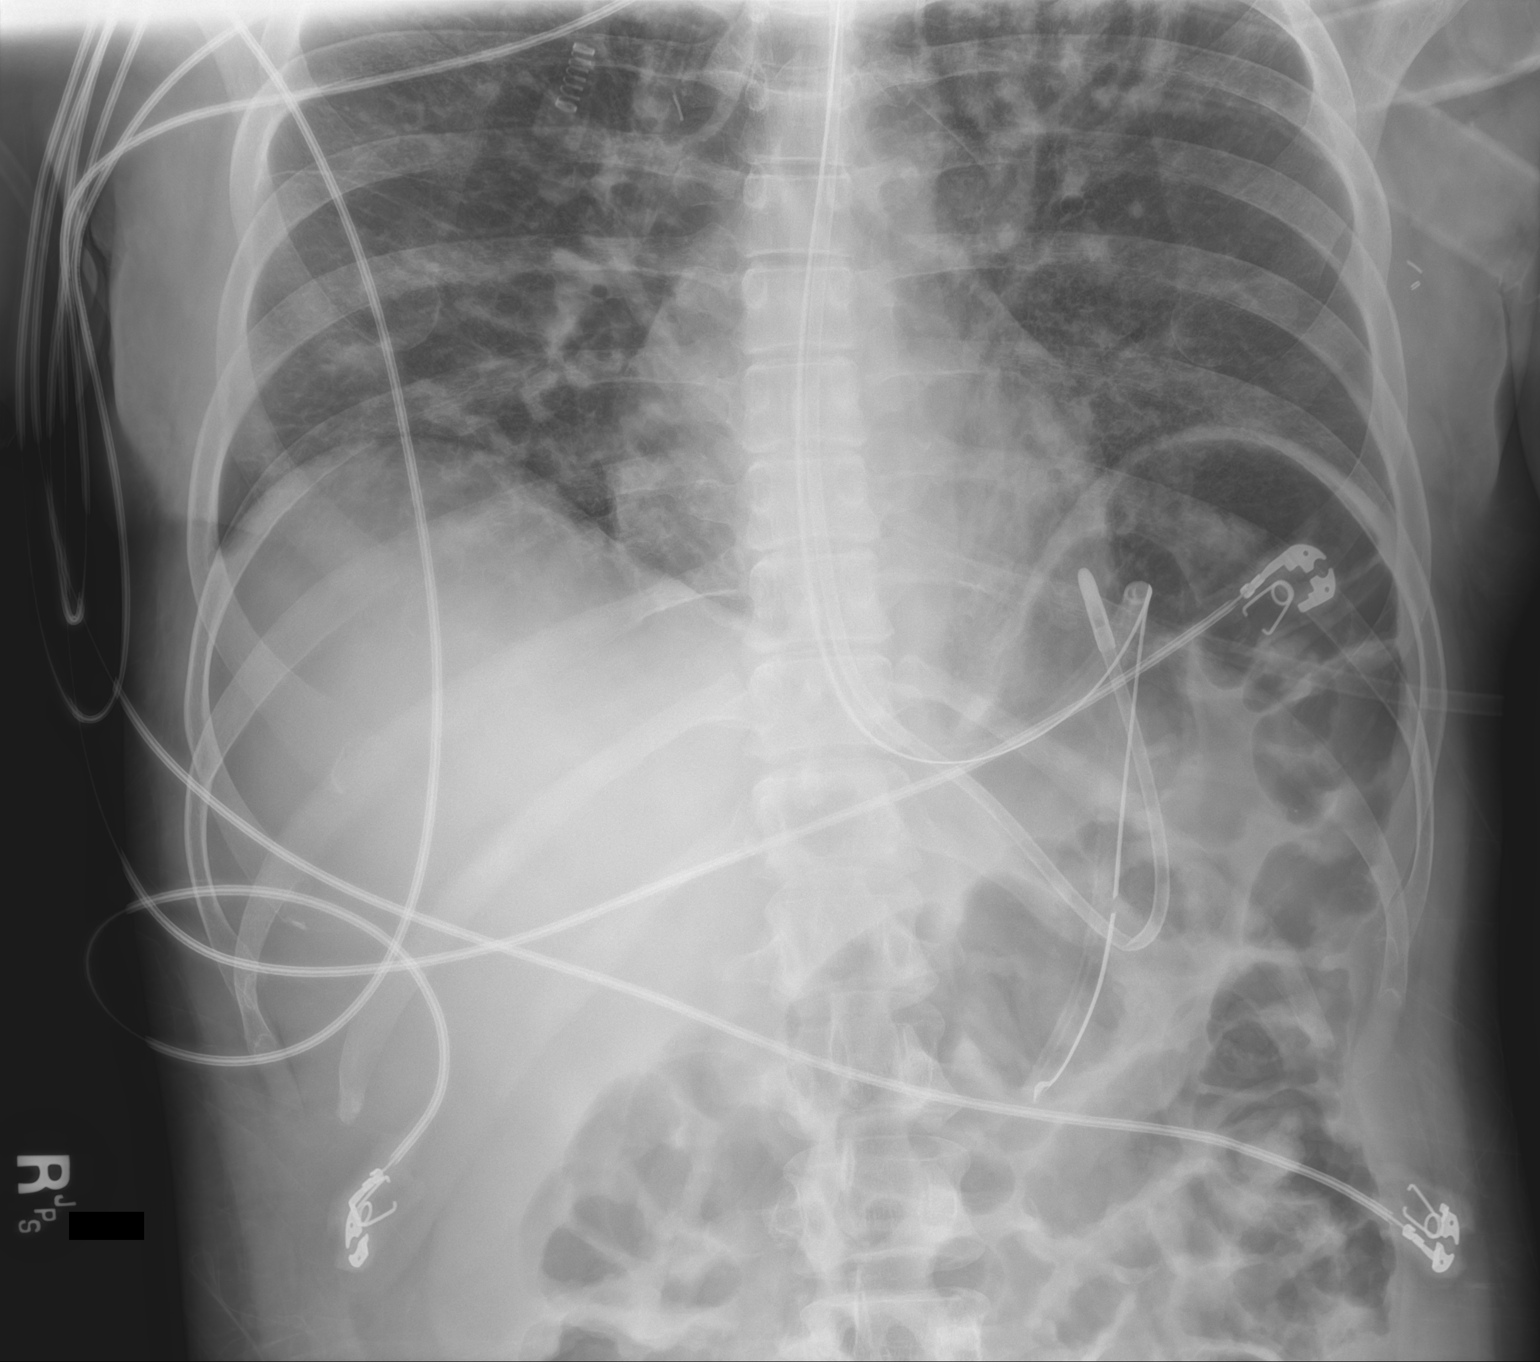

[1 of 1 positions shown; findings below may reference images not displayed]

FINDINGS: Portable AP view at 2632 hours. Enteric tube has been advanced into
the stomach, side hole now the level of the gastric body. Unchanged
superimposed feeding tube. And stable visible lower chest and
abdomen.
IMPRESSION: NG tube advanced into the stomach with side hole now the level of
the gastric body.

Unchanged superimposed feeding tube.

## 2023-07-31 IMAGING — CT CT ANGIO CHEST
2 of 6 series · 18 of 46 positions shown · IV contrast (omnipaque)
Comparison: Chest radiograph 02/25/2021 and CTA 12/22/2017

CLINICAL DATA: PE suspected, high prob.  Shortness of breath.

EXAM:
CT ANGIOGRAPHY CHEST WITH CONTRAST
TECHNIQUE: Multidetector CT imaging of the chest was performed using the
standard protocol during bolus administration of intravenous
contrast. Multiplanar CT image reconstructions and MIPs were
obtained to evaluate the vascular anatomy.
CONTRAST:  50mL OMNIPAQUE IOHEXOL 350 MG/ML SOLN

[Series 6: thins · axial · 0.69mm/px · z∈[+1119,+1381]mm · 15 of 288 slices shown]
[im 13/288  lung]
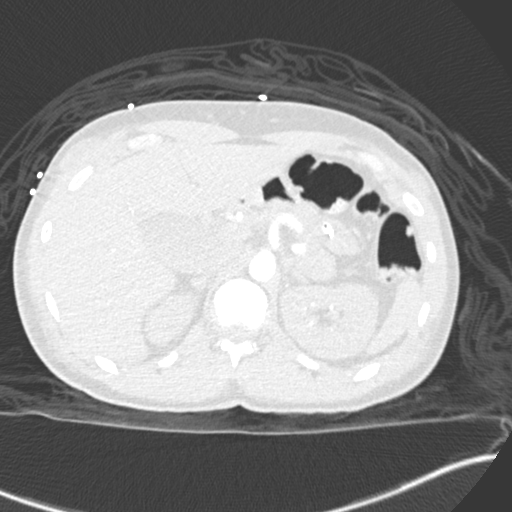
[im 38/288  soft-tissue]
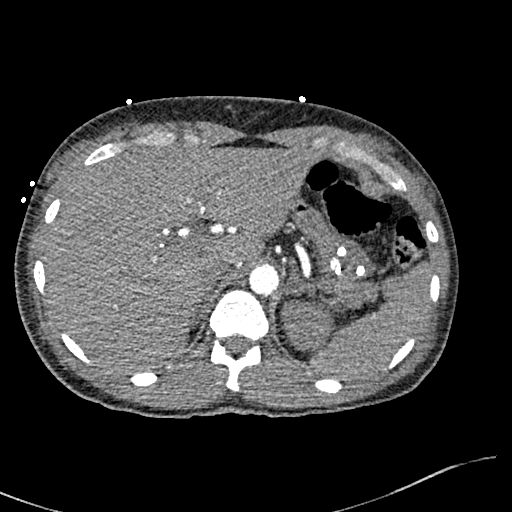
[im 50/288  lung]
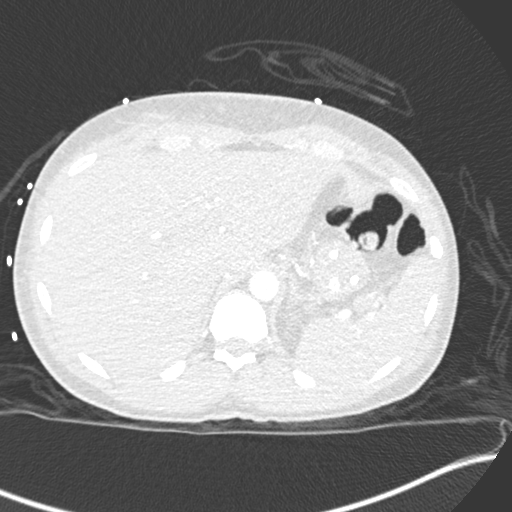
[im 75/288  soft-tissue]
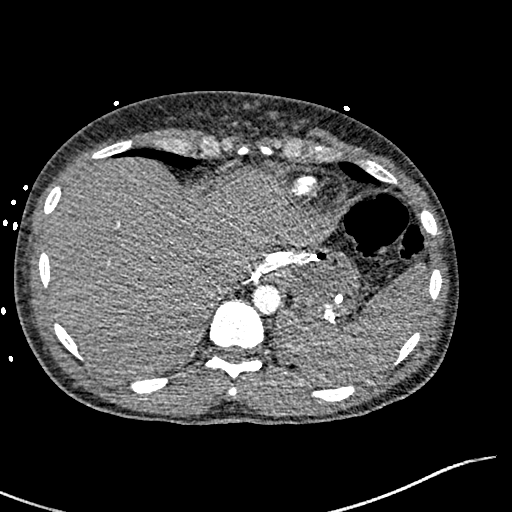
[im 88/288  lung]
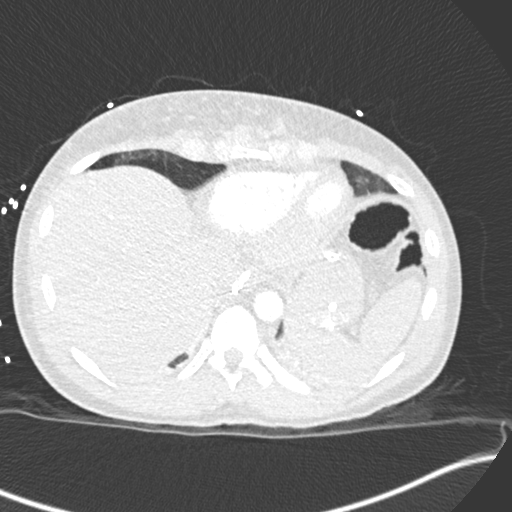
[im 113/288  soft-tissue]
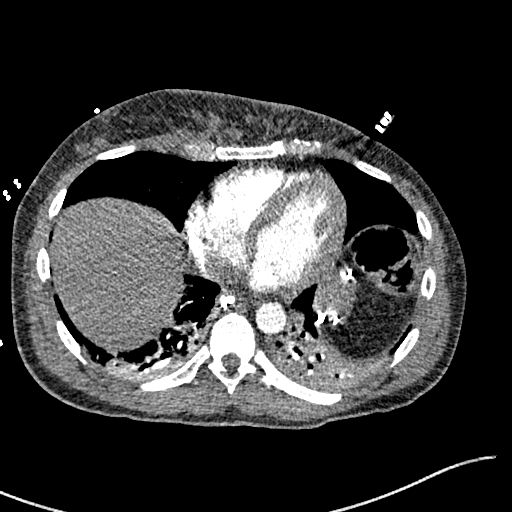
[im 125/288  lung]
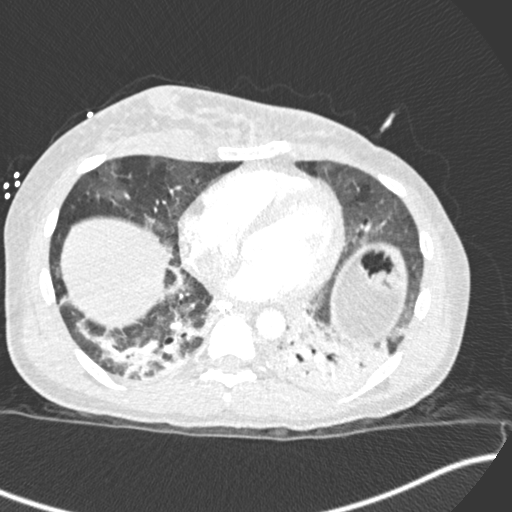
[im 150/288  soft-tissue]
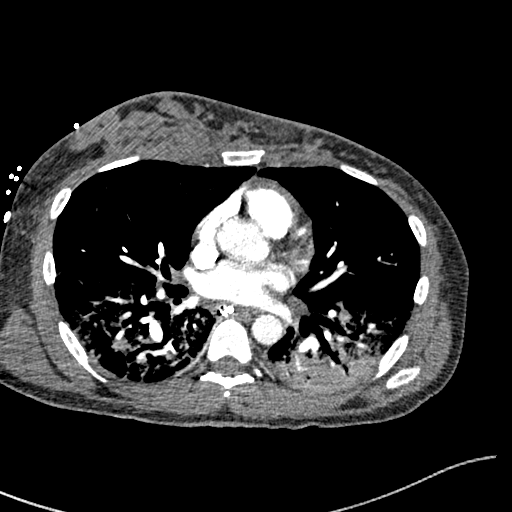
[im 163/288  lung]
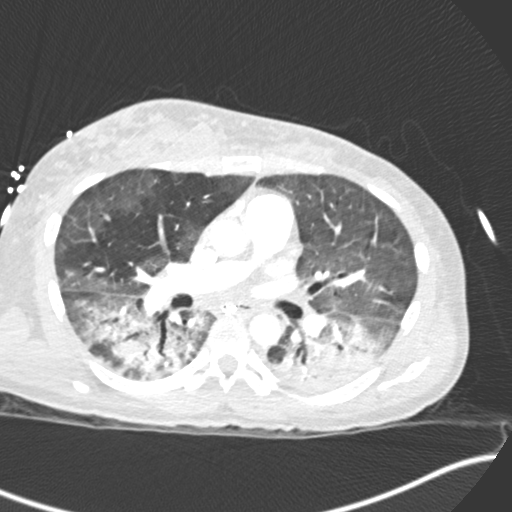
[im 175/288  soft-tissue]
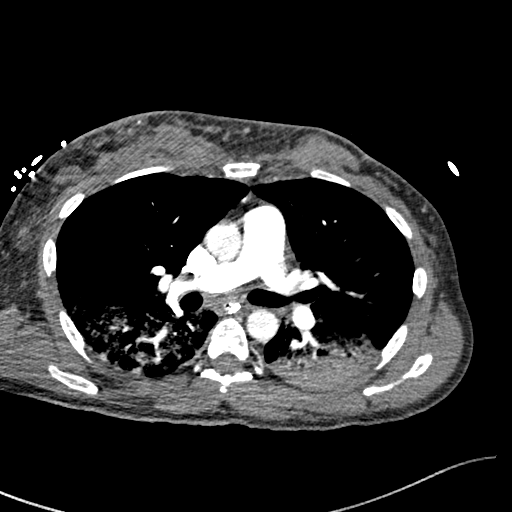
[im 200/288  lung]
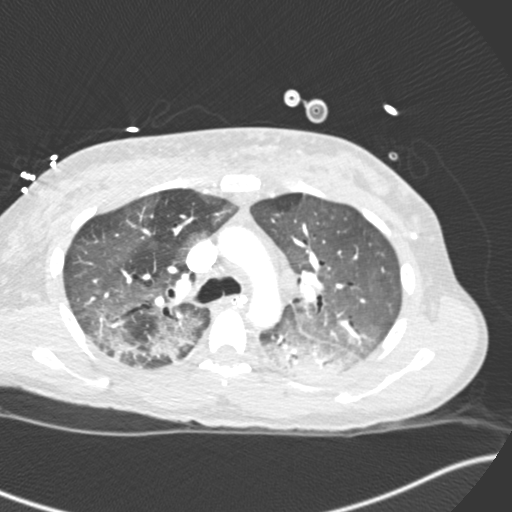
[im 213/288  soft-tissue]
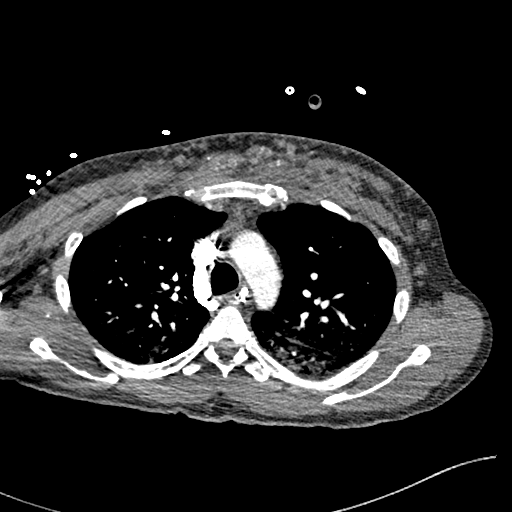
[im 238/288  lung]
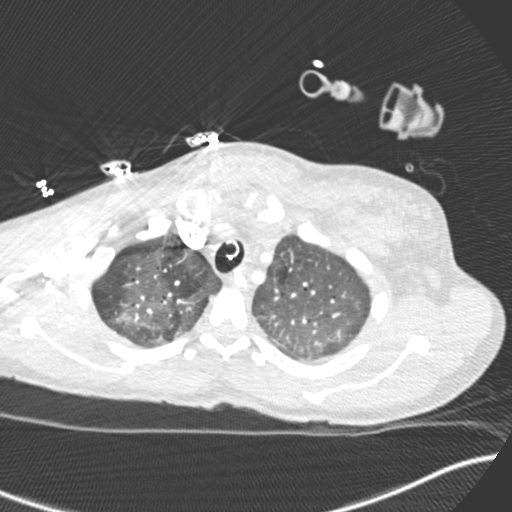
[im 250/288  soft-tissue]
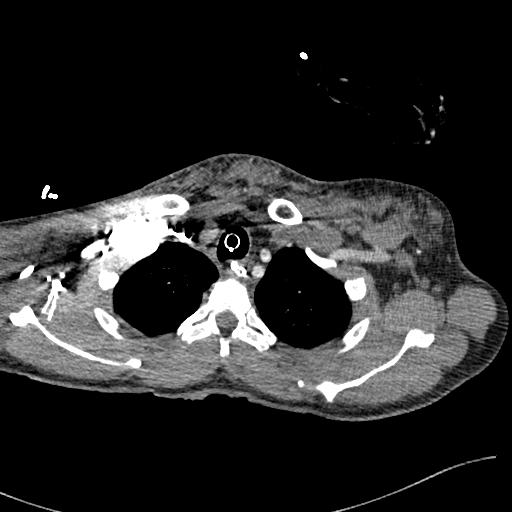
[im 275/288  lung]
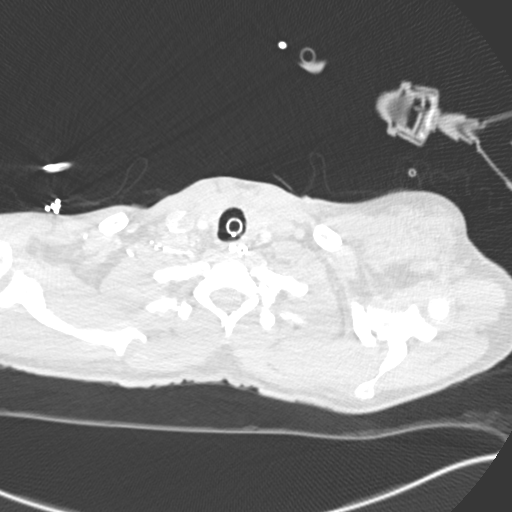

[Series 8: coronal mpr · coronal · 0.59mm/px · 3 of 151 slices shown]
[im 38/151  soft-tissue]
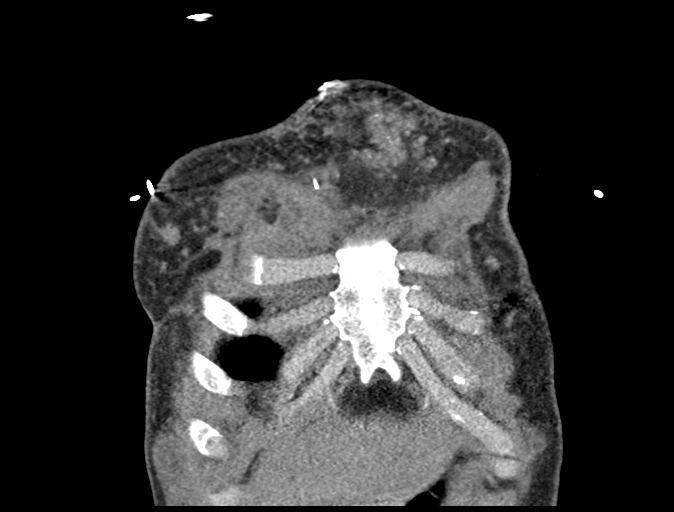
[im 76/151  soft-tissue]
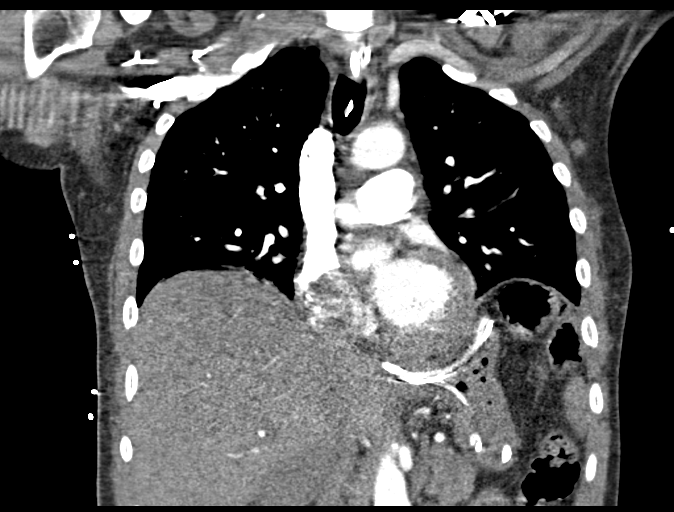
[im 113/151  soft-tissue]
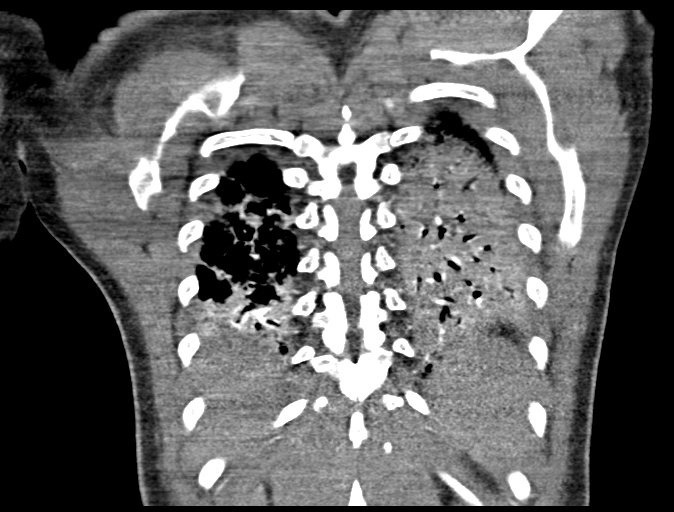

[18 of 46 positions shown; findings below may reference images not displayed]

FINDINGS: Cardiovascular: Pulmonary arterial opacification is adequate without
evidence of emboli. The thoracic aorta is normal in caliber. The
heart is normal in size. There is no pericardial effusion.

Mediastinum/Nodes: Similar appearance of nodular soft tissue
throughout the right greater than left anterior chest wall compared
to the 1474 CTA compatible with prior surgery. No enlarged
mediastinal or hilar lymph nodes. Endotracheal tube in place
terminating well above the carina.

Lungs/Pleura: No pleural effusion or pneumothorax. Consolidation
with air bronchograms in the lower lobes, extensive on the left.
Extensive ground-glass opacities elsewhere throughout both lungs.

Upper Abdomen: Partially visualized enteric tubes including a
feeding tube with its tip in the gastric fundus.

Musculoskeletal: Unchanged minimal superior endplate compression
deformities of the T3-T5 vertebral bodies.

Review of the MIP images confirms the above findings.
IMPRESSION: 1. No evidence of pulmonary emboli.
2. Extensive bilateral lung opacities which may reflect pneumonia.

## 2023-07-31 IMAGING — DX DG ABD PORTABLE 1V
1 series · 1 of 1 positions shown · non-contrast
Comparison: 02/25/2021 and earlier.

CLINICAL DATA: 39-year-old male OG tube placement.

EXAM:
PORTABLE ABDOMEN - 1 VIEW

[abdomen]
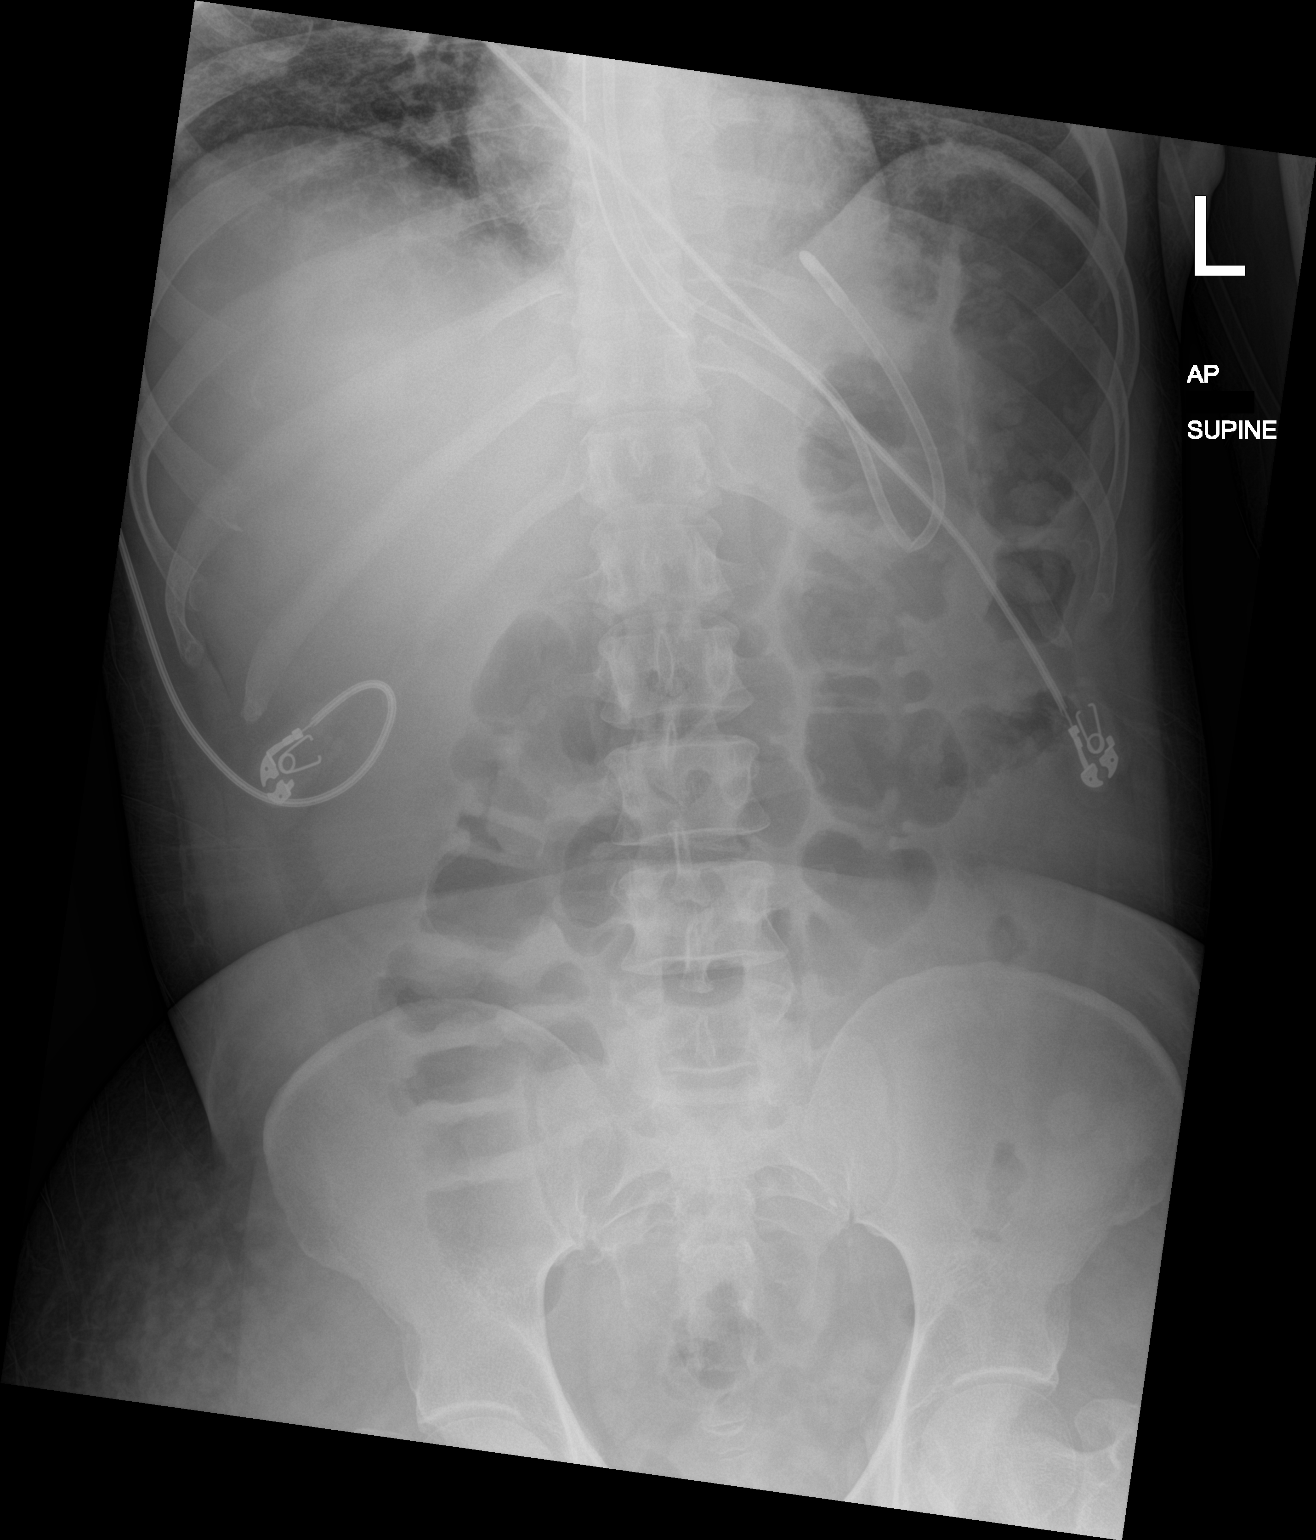

[1 of 1 positions shown; findings below may reference images not displayed]

FINDINGS: Portable AP supine view at 4347 hours. Enteric feeding tube, which
had reached the gastric antrum or duodenal bulb yesterday is now
looped back into the proximal stomach, tip at the gastric fundus.
And there is a mildly kinked appearance of the tube at the gastric
body (arrow).

Additionally, and NG type tube has also been inserted and parallels
the feeding tube in the distal esophagus terminating near the
gastroesophageal junction. The side hole is about 7 cm above the
GEJ.

Improved ventilation at the lung bases. Non obstructed bowel gas
pattern. No acute osseous abnormality identified.
IMPRESSION: 1. NG tube in the distal esophagus with tip near the GE J. Advance
10-12 cm to ensure side hole placement within the stomach.
2. Enteric feeding tube tip has flipped into the proximal stomach.

## 2023-07-31 IMAGING — DX DG ABD PORTABLE 1V
1 series · 1 of 1 positions shown · non-contrast
Comparison: 02/26/2021 at 8889 hours

CLINICAL DATA: NG tube advancement

EXAM:
PORTABLE ABDOMEN - 1 VIEW

[abdomen supine]
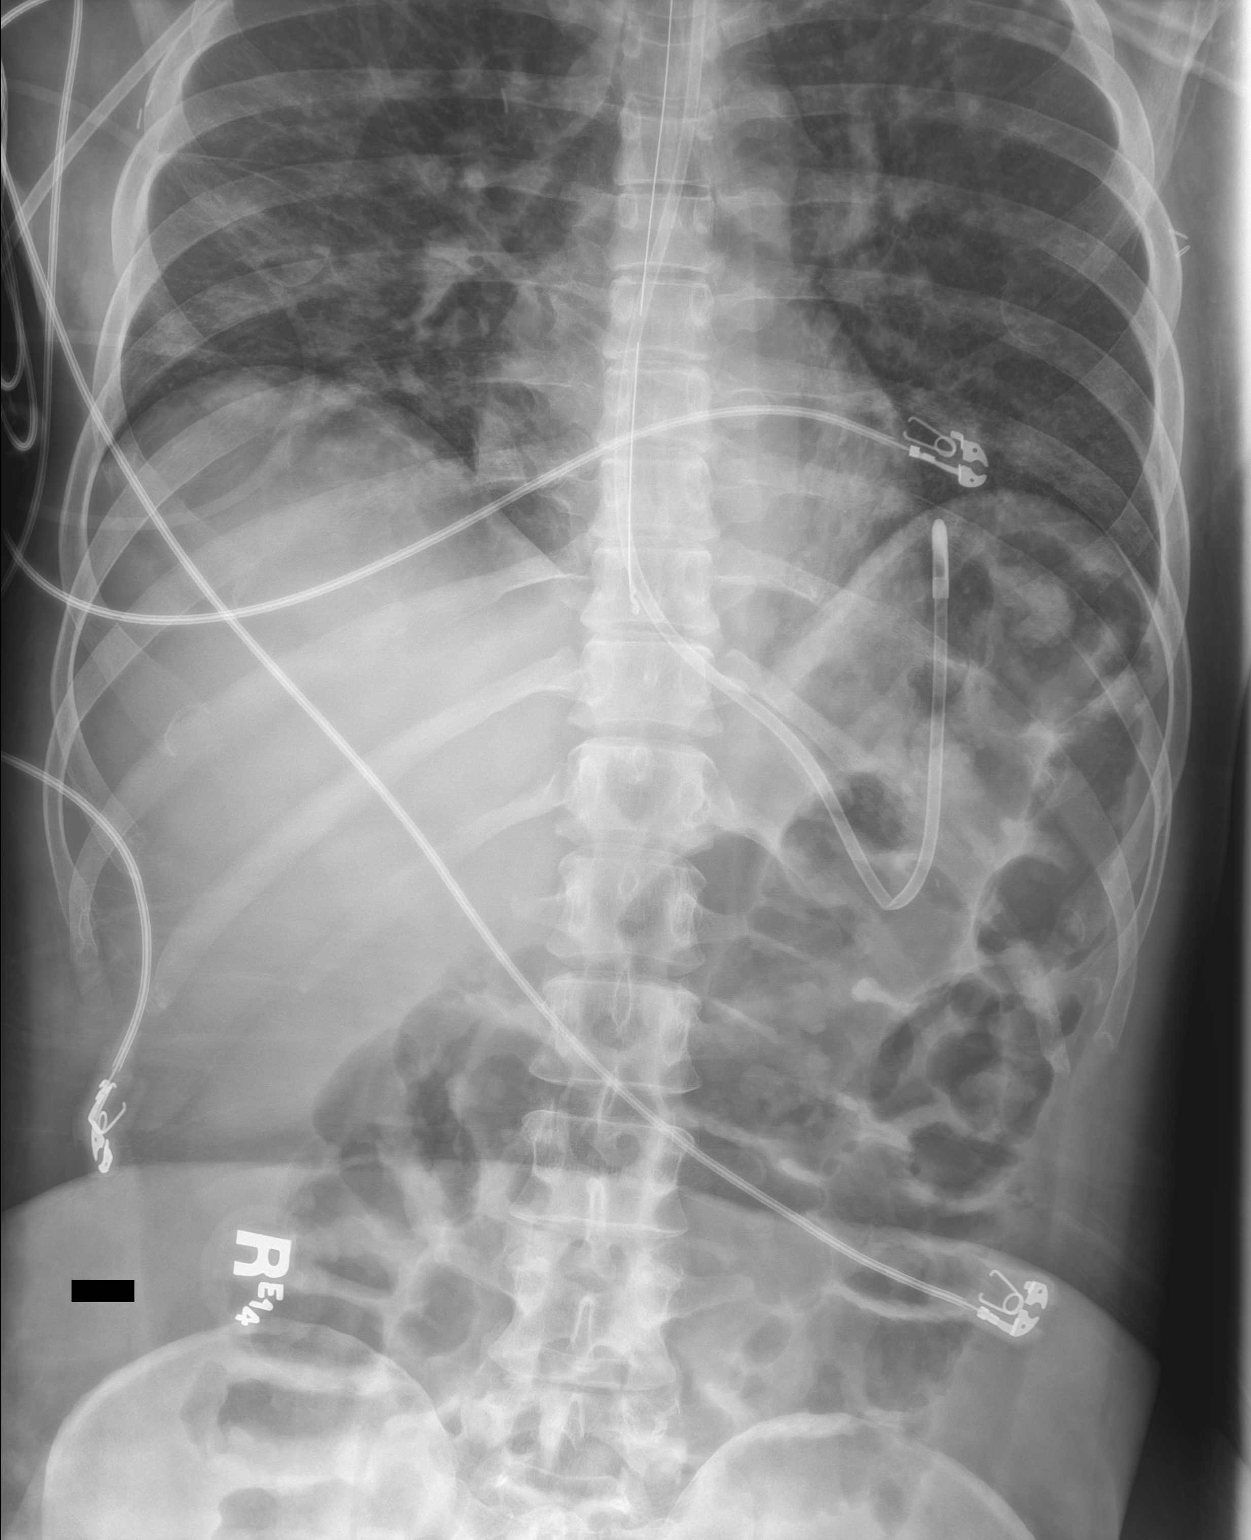

[1 of 1 positions shown; findings below may reference images not displayed]

FINDINGS: Nasogastric tube distal tip terminates within the distal esophagus.
Large bore feeding tube distal tip terminates within the gastric
fundus with kinked appearance, similar to prior. Visualized bowel
gas pattern is nonobstructive.
IMPRESSION: 1. Nasogastric tube distal tip terminates within the distal
esophagus. Recommend advancement 12-15 cm.
2. Large bore feeding tube distal tip terminates within the gastric
fundus with kinked appearance, similar to prior.

## 2023-08-01 IMAGING — DX DG ABD PORTABLE 1V
1 series · 1 of 1 positions shown · non-contrast
Comparison: 6688

CLINICAL DATA: NG tube placement.

EXAM:
PORTABLE ABDOMEN - 1 VIEW

[abdomen]
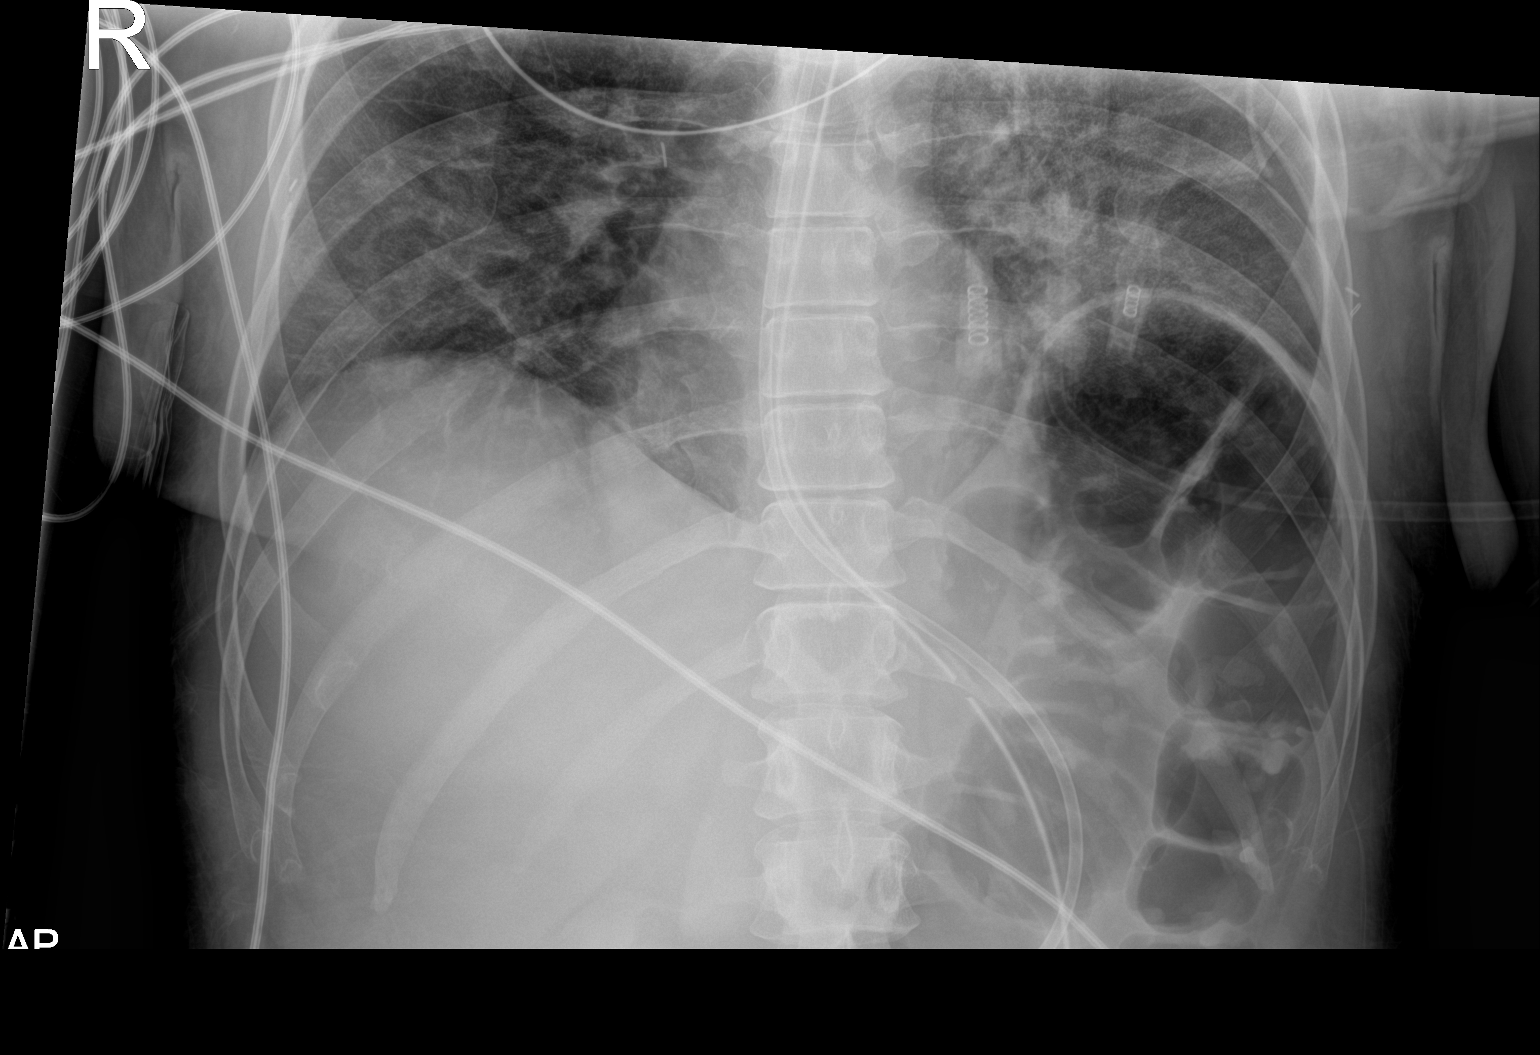

[1 of 1 positions shown; findings below may reference images not displayed]

FINDINGS: Feeding tube with tip in the first portion the duodenum. NG tube
with tip in the stomach with side port below the GE junction.
IMPRESSION: Feeding tube and NG tube as above.

## 2023-08-02 IMAGING — DX DG CHEST 1V PORT
1 series · 1 of 1 positions shown · non-contrast
Comparison: February 25, 2021

CLINICAL DATA: Adult respiratory distress syndrome

EXAM:
PORTABLE CHEST 1 VIEW

[chest ap]
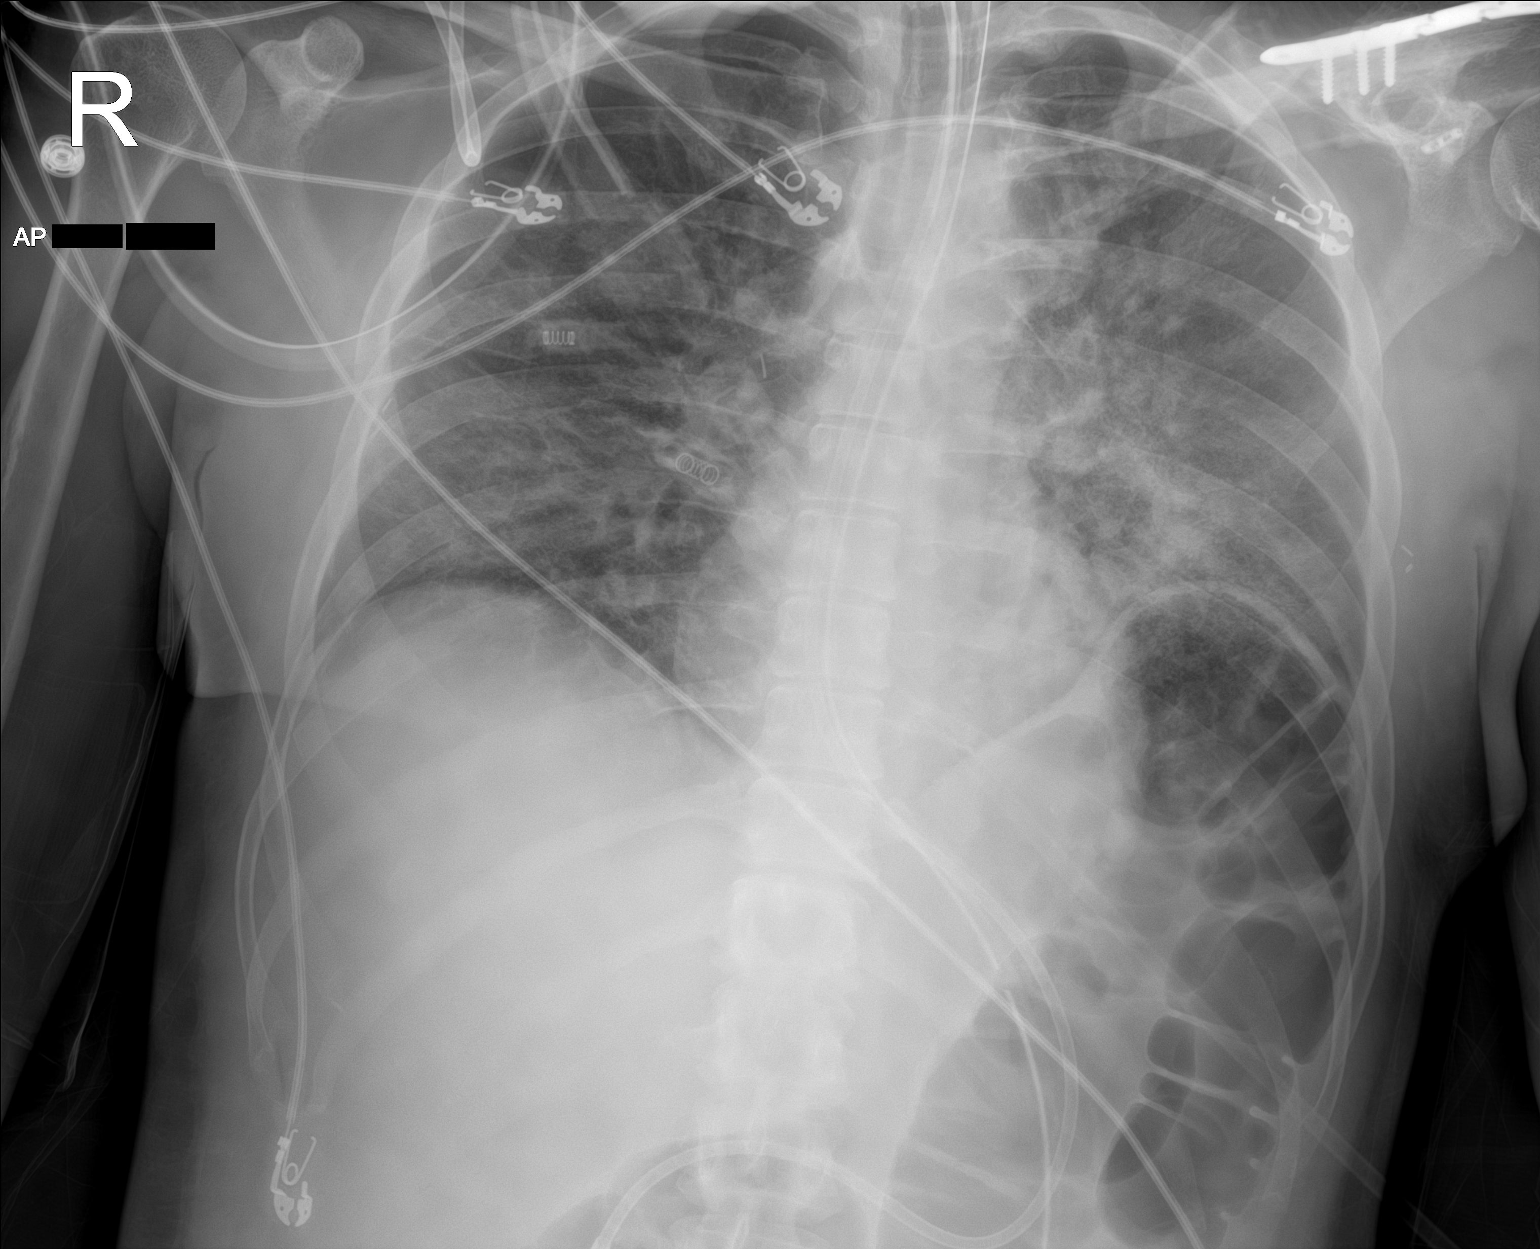

[1 of 1 positions shown; findings below may reference images not displayed]

FINDINGS: The distal tip of the ET tube is partially obscured by the feeding
tube. However, the ETT appears to be in good position. The NG and
feeding tubes terminate below today's film. The cardiomediastinal
silhouette is stable. No pneumothorax. Bilateral pulmonary
infiltrates remain, similar in the interval. No other acute
abnormalities or changes.
IMPRESSION: 1. Support apparatus as above.
2. Bilateral pulmonary infiltrates remain, not significantly
changed.
3. No other interval changes.

## 2023-08-03 ENCOUNTER — Ambulatory Visit (HOSPITAL_COMMUNITY): Payer: MEDICAID

## 2023-08-03 IMAGING — DX DG CHEST 1V PORT
1 series · 1 of 1 positions shown · non-contrast
Comparison: February 28, 2021

CLINICAL DATA: Follow-up.

EXAM:
PORTABLE CHEST 1 VIEW

[chest]
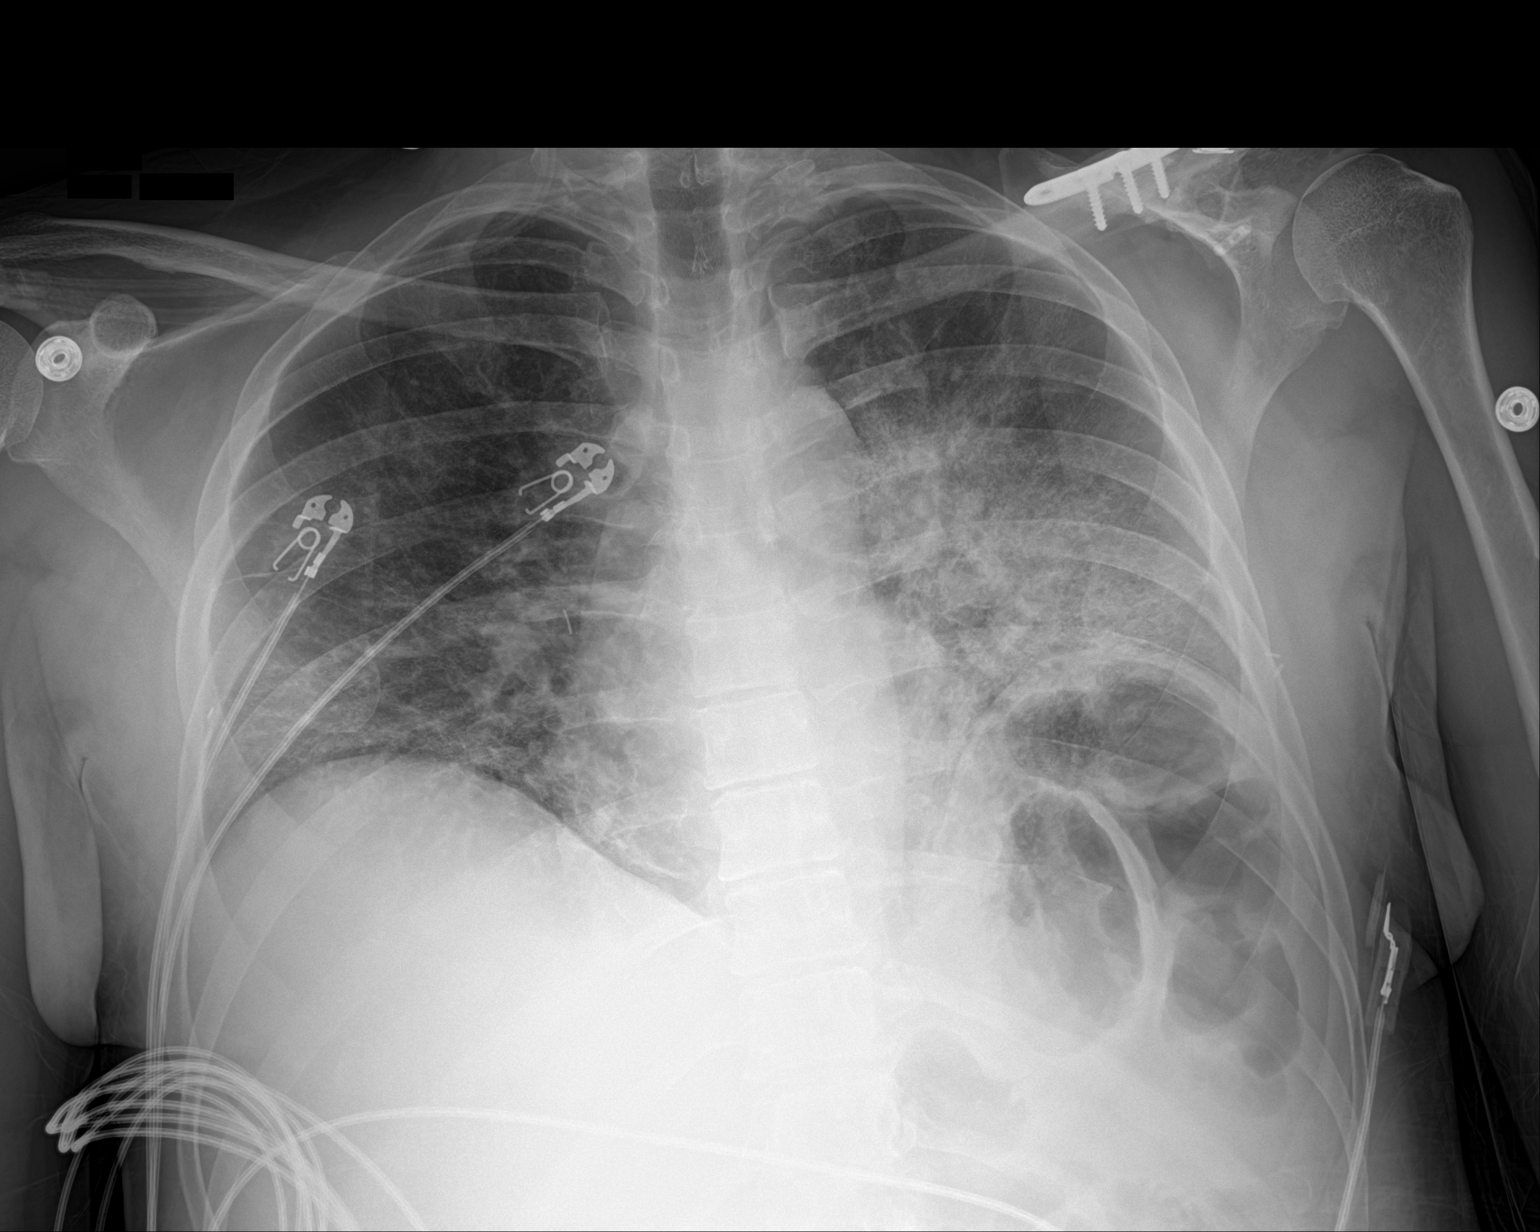

[1 of 1 positions shown; findings below may reference images not displayed]

FINDINGS: Bilateral pulmonary infiltrates, left greater than right, stable on
the right and stable to mildly worsened on the left. No other
interval changes.
IMPRESSION: Bilateral pulmonary infiltrates persist. The mild infiltrate in the
right base is stable. The more marked infiltrate in the left base is
stable to mildly more prominent the interval.

## 2023-08-04 ENCOUNTER — Telehealth: Payer: Self-pay | Admitting: Medical Oncology

## 2023-08-04 NOTE — Telephone Encounter (Signed)
[facility]  Outgoing call: 11:42  Follow-up call to patient regarding missed CT scan yesterday. Spoke with patient's mother, who is listed as the emergency contact as well as the patient's contact number. [name], patient's mother stated that she wasn't aware of the appointment and apologized for missing it. Per [name], patient recently moved and more than likely forgot about the appointment and she wasn't aware of it, she asked if it could be rescheduled. I informed [name] that I will call scheduling to have it rescheduled to a future date and for them to expect a return call for confirmation. [name] states afternoons are best. I thanked her for her time and encouraged her to call with questions.   Scheduling contacted and confirmed they will reach out to the contact number listed to rescheduled CT.  Rexene Edison, RN, BSN, [facility] Oncology Nurse Navigator, [facility] 08/04/2023 11:48 AM

## 2023-08-05 ENCOUNTER — Ambulatory Visit (HOSPITAL_COMMUNITY)
Admission: RE | Admit: 2023-08-05 | Discharge: 2023-08-05 | Disposition: A | Discharge status: Home / Self Care | Payer: MEDICAID | Source: Ambulatory Visit | Attending: Physician Assistant

## 2023-08-05 DIAGNOSIS — R591 Generalized enlarged lymph nodes: Secondary | ICD-10-CM | POA: Insufficient documentation

## 2023-08-05 MED ORDER — IOHEXOL 300 MG/ML  SOLN
100.0000 mL | Freq: Once | INTRAMUSCULAR | Status: AC | PRN
  Administered 2023-08-05: 100 mL via INTRAVENOUS

## 2023-08-07 ENCOUNTER — Other Ambulatory Visit: Payer: Self-pay | Admitting: Physician Assistant

## 2023-08-07 DIAGNOSIS — R591 Generalized enlarged lymph nodes: Secondary | ICD-10-CM

## 2023-08-10 ENCOUNTER — Other Ambulatory Visit: Payer: Self-pay | Admitting: Physician Assistant

## 2023-08-10 ENCOUNTER — Inpatient Hospital Stay: Payer: MEDICAID | Admitting: Physician Assistant

## 2023-08-10 DIAGNOSIS — R591 Generalized enlarged lymph nodes: Secondary | ICD-10-CM

## 2023-08-10 NOTE — Progress Notes (Deleted)
lets get a CT of the chest to complete the imaging. we can schedule 3 month follow up with repeat scan (to assure lung nodule resolved). Its ground glass, normally infectious/transient

## 2023-08-10 NOTE — Progress Notes (Signed)
Patient scheduled for CT CAP on 08/05/23. Only CT AP was performed and I was not informed of order being changed. We do need a CT scan of his chest to complete his staging work up. CT scan of chest has been ordered.

## 2023-08-10 NOTE — Progress Notes (Deleted)
Attempted to call at 10:20 AM and mother reports he is sleeping. Will call back at 330 PM

## 2023-08-11 ENCOUNTER — Inpatient Hospital Stay: Payer: MEDICAID | Admitting: Physician Assistant

## 2023-08-12 ENCOUNTER — Encounter (HOSPITAL_COMMUNITY): Payer: Self-pay | Admitting: Emergency Medicine

## 2023-08-12 ENCOUNTER — Other Ambulatory Visit: Payer: Self-pay

## 2023-08-12 ENCOUNTER — Inpatient Hospital Stay (HOSPITAL_COMMUNITY)
Admission: EM | Admit: 2023-08-12 | Discharge: 2023-08-15 | DRG: 896 | Disposition: A | Discharge status: Home / Self Care | Payer: MEDICAID | Attending: Family Medicine | Admitting: Family Medicine

## 2023-08-12 DIAGNOSIS — I2510 Atherosclerotic heart disease of native coronary artery without angina pectoris: Secondary | ICD-10-CM | POA: Diagnosis present

## 2023-08-12 DIAGNOSIS — K922 Gastrointestinal hemorrhage, unspecified: Secondary | ICD-10-CM

## 2023-08-12 DIAGNOSIS — Z885 Allergy status to narcotic agent status: Secondary | ICD-10-CM

## 2023-08-12 DIAGNOSIS — Z823 Family history of stroke: Secondary | ICD-10-CM | POA: Diagnosis not present

## 2023-08-12 DIAGNOSIS — F319 Bipolar disorder, unspecified: Secondary | ICD-10-CM | POA: Diagnosis present

## 2023-08-12 DIAGNOSIS — I739 Peripheral vascular disease, unspecified: Secondary | ICD-10-CM | POA: Diagnosis present

## 2023-08-12 DIAGNOSIS — R112 Nausea with vomiting, unspecified: Principal | ICD-10-CM

## 2023-08-12 DIAGNOSIS — Z91018 Allergy to other foods: Secondary | ICD-10-CM | POA: Diagnosis not present

## 2023-08-12 DIAGNOSIS — E7800 Pure hypercholesterolemia, unspecified: Secondary | ICD-10-CM | POA: Diagnosis present

## 2023-08-12 DIAGNOSIS — F209 Schizophrenia, unspecified: Secondary | ICD-10-CM | POA: Diagnosis present

## 2023-08-12 DIAGNOSIS — F418 Other specified anxiety disorders: Secondary | ICD-10-CM | POA: Diagnosis present

## 2023-08-12 DIAGNOSIS — Z9151 Personal history of suicidal behavior: Secondary | ICD-10-CM

## 2023-08-12 DIAGNOSIS — Z85528 Personal history of other malignant neoplasm of kidney: Secondary | ICD-10-CM

## 2023-08-12 DIAGNOSIS — Z83438 Family history of other disorder of lipoprotein metabolism and other lipidemia: Secondary | ICD-10-CM

## 2023-08-12 DIAGNOSIS — F1020 Alcohol dependence, uncomplicated: Secondary | ICD-10-CM | POA: Diagnosis present

## 2023-08-12 DIAGNOSIS — R609 Edema, unspecified: Secondary | ICD-10-CM | POA: Diagnosis present

## 2023-08-12 DIAGNOSIS — Z880 Allergy status to penicillin: Secondary | ICD-10-CM

## 2023-08-12 DIAGNOSIS — R197 Diarrhea, unspecified: Secondary | ICD-10-CM

## 2023-08-12 DIAGNOSIS — Z853 Personal history of malignant neoplasm of breast: Secondary | ICD-10-CM

## 2023-08-12 DIAGNOSIS — K921 Melena: Secondary | ICD-10-CM

## 2023-08-12 DIAGNOSIS — K274 Chronic or unspecified peptic ulcer, site unspecified, with hemorrhage: Principal | ICD-10-CM | POA: Diagnosis present

## 2023-08-12 DIAGNOSIS — F1410 Cocaine abuse, uncomplicated: Secondary | ICD-10-CM | POA: Diagnosis present

## 2023-08-12 DIAGNOSIS — F10939 Alcohol use, unspecified with withdrawal, unspecified: Secondary | ICD-10-CM | POA: Diagnosis present

## 2023-08-12 DIAGNOSIS — Z905 Acquired absence of kidney: Secondary | ICD-10-CM

## 2023-08-12 DIAGNOSIS — K2960 Other gastritis without bleeding: Secondary | ICD-10-CM | POA: Diagnosis not present

## 2023-08-12 DIAGNOSIS — Z8249 Family history of ischemic heart disease and other diseases of the circulatory system: Secondary | ICD-10-CM

## 2023-08-12 DIAGNOSIS — Z79899 Other long term (current) drug therapy: Secondary | ICD-10-CM

## 2023-08-12 DIAGNOSIS — G8929 Other chronic pain: Secondary | ICD-10-CM | POA: Diagnosis present

## 2023-08-12 DIAGNOSIS — Z9013 Acquired absence of bilateral breasts and nipples: Secondary | ICD-10-CM | POA: Diagnosis not present

## 2023-08-12 DIAGNOSIS — I10 Essential (primary) hypertension: Secondary | ICD-10-CM | POA: Diagnosis present

## 2023-08-12 DIAGNOSIS — E872 Acidosis, unspecified: Secondary | ICD-10-CM | POA: Diagnosis present

## 2023-08-12 DIAGNOSIS — Z91411 Personal history of adult psychological abuse: Secondary | ICD-10-CM

## 2023-08-12 DIAGNOSIS — K279 Peptic ulcer, site unspecified, unspecified as acute or chronic, without hemorrhage or perforation: Secondary | ICD-10-CM | POA: Diagnosis present

## 2023-08-12 DIAGNOSIS — F17210 Nicotine dependence, cigarettes, uncomplicated: Secondary | ICD-10-CM | POA: Diagnosis present

## 2023-08-12 DIAGNOSIS — F10139 Alcohol abuse with withdrawal, unspecified: Principal | ICD-10-CM | POA: Diagnosis present

## 2023-08-12 DIAGNOSIS — G629 Polyneuropathy, unspecified: Secondary | ICD-10-CM | POA: Diagnosis present

## 2023-08-12 DIAGNOSIS — F10930 Alcohol use, unspecified with withdrawal, uncomplicated: Secondary | ICD-10-CM | POA: Diagnosis not present

## 2023-08-12 DIAGNOSIS — F4310 Post-traumatic stress disorder, unspecified: Secondary | ICD-10-CM | POA: Diagnosis present

## 2023-08-12 DIAGNOSIS — Z91410 Personal history of adult physical and sexual abuse: Secondary | ICD-10-CM

## 2023-08-12 HISTORY — DX: Nausea with vomiting, unspecified: R11.2

## 2023-08-12 LAB — CBC
HCT: 42.4 % (ref 39.0–52.0)
Hemoglobin: 14.6 g/dL (ref 13.0–17.0)
MCH: 29.3 pg (ref 26.0–34.0)
MCHC: 34.4 g/dL (ref 30.0–36.0)
MCV: 85 fL (ref 80.0–100.0)
Platelets: 173 10*3/uL (ref 150–400)
RBC: 4.99 MIL/uL (ref 4.22–5.81)
RDW: 15.1 % (ref 11.5–15.5)
WBC: 5.2 10*3/uL (ref 4.0–10.5)
nRBC: 0 % (ref 0.0–0.2)

## 2023-08-12 LAB — COMPREHENSIVE METABOLIC PANEL
ALT: 17 U/L (ref 0–44)
AST: 69 U/L — ABNORMAL HIGH (ref 15–41)
Albumin: 4 g/dL (ref 3.5–5.0)
Alkaline Phosphatase: 77 U/L (ref 38–126)
Anion gap: 18 — ABNORMAL HIGH (ref 5–15)
BUN: 12 mg/dL (ref 6–20)
CO2: 21 mmol/L — ABNORMAL LOW (ref 22–32)
Calcium: 8.7 mg/dL — ABNORMAL LOW (ref 8.9–10.3)
Chloride: 96 mmol/L — ABNORMAL LOW (ref 98–111)
Creatinine, Ser: 0.76 mg/dL (ref 0.61–1.24)
GFR, Estimated: >60 mL/min (ref >60)
Glucose, Bld: 117 mg/dL — ABNORMAL HIGH (ref 70–99)
Potassium: 3.9 mmol/L (ref 3.5–5.1)
Sodium: 135 mmol/L (ref 135–145)
Total Bilirubin: 0.8 mg/dL (ref 0.0–1.2)
Total Protein: 8.5 g/dL — ABNORMAL HIGH (ref 6.5–8.1)

## 2023-08-12 LAB — PROTIME-INR
INR: 1 (ref 0.8–1.2)
Prothrombin Time: 13.9 s (ref 11.4–15.2)

## 2023-08-12 LAB — ETHANOL: Alcohol, Ethyl (B): 22 mg/dL — ABNORMAL HIGH (ref <10)

## 2023-08-12 LAB — LIPASE, BLOOD: Lipase: 21 U/L (ref 11–51)

## 2023-08-12 LAB — POC OCCULT BLOOD, ED: Fecal Occult Bld: POSITIVE — AB

## 2023-08-12 MED ORDER — RISPERIDONE 1 MG PO TABS
1.0000 mg | ORAL_TABLET | Freq: Every day | ORAL | Status: DC
  Administered 2023-08-12 – 2023-08-14 (×3): 1 mg via ORAL
  Filled 2023-08-12 (×3): qty 1

## 2023-08-12 MED ORDER — ONDANSETRON HCL 4 MG/2ML IJ SOLN
4.0000 mg | Freq: Four times a day (QID) | INTRAMUSCULAR | Status: DC | PRN
  Administered 2023-08-12: 4 mg via INTRAVENOUS
  Filled 2023-08-12: qty 2

## 2023-08-12 MED ORDER — PRAZOSIN HCL 1 MG PO CAPS
1.0000 mg | ORAL_CAPSULE | Freq: Every day | ORAL | Status: DC
  Administered 2023-08-12 – 2023-08-14 (×3): 1 mg via ORAL
  Filled 2023-08-12 (×4): qty 1

## 2023-08-12 MED ORDER — LORAZEPAM 1 MG PO TABS
1.0000 mg | ORAL_TABLET | ORAL | Status: AC | PRN
Start: 2023-08-12 — End: 2023-08-15
  Administered 2023-08-12 – 2023-08-14 (×2): 1 mg via ORAL
  Filled 2023-08-12 (×2): qty 1

## 2023-08-12 MED ORDER — AMLODIPINE BESYLATE 5 MG PO TABS
5.0000 mg | ORAL_TABLET | Freq: Every day | ORAL | Status: DC
  Administered 2023-08-13 – 2023-08-15 (×3): 5 mg via ORAL
  Filled 2023-08-12 (×3): qty 1

## 2023-08-12 MED ORDER — ALUM & MAG HYDROXIDE-SIMETH 200-200-20 MG/5ML PO SUSP
30.0000 mL | Freq: Once | ORAL | Status: AC
  Administered 2023-08-12: 30 mL via ORAL
  Filled 2023-08-12: qty 30

## 2023-08-12 MED ORDER — HYDROMORPHONE HCL 1 MG/ML IJ SOLN
0.5000 mg | Freq: Four times a day (QID) | INTRAMUSCULAR | Status: DC | PRN
  Administered 2023-08-13: 0.5 mg via INTRAVENOUS
  Filled 2023-08-12: qty 0.5

## 2023-08-12 MED ORDER — PANTOPRAZOLE SODIUM 40 MG IV SOLR
40.0000 mg | Freq: Two times a day (BID) | INTRAVENOUS | Status: DC
  Administered 2023-08-12 – 2023-08-15 (×6): 40 mg via INTRAVENOUS
  Filled 2023-08-12 (×5): qty 10

## 2023-08-12 MED ORDER — ONDANSETRON HCL 4 MG PO TABS: 4.0000 mg | ORAL_TABLET | Freq: Four times a day (QID) | ORAL | Status: DC | PRN

## 2023-08-12 MED ORDER — BUSPIRONE HCL 5 MG PO TABS
15.0000 mg | ORAL_TABLET | Freq: Three times a day (TID) | ORAL | Status: DC
  Administered 2023-08-12 – 2023-08-15 (×7): 15 mg via ORAL
  Filled 2023-08-12 (×7): qty 3

## 2023-08-12 MED ORDER — FOLIC ACID 1 MG PO TABS
1.0000 mg | ORAL_TABLET | Freq: Every day | ORAL | Status: DC
  Administered 2023-08-12 – 2023-08-15 (×3): 1 mg via ORAL
  Filled 2023-08-12 (×4): qty 1

## 2023-08-12 MED ORDER — THIAMINE HCL 100 MG/ML IJ SOLN
100.0000 mg | Freq: Every day | INTRAMUSCULAR | Status: DC
  Administered 2023-08-12: 100 mg via INTRAVENOUS
  Filled 2023-08-12: qty 2

## 2023-08-12 MED ORDER — ACETAMINOPHEN 650 MG RE SUPP: 650.0000 mg | Freq: Four times a day (QID) | RECTAL | Status: DC | PRN

## 2023-08-12 MED ORDER — LIDOCAINE VISCOUS HCL 2 % MT SOLN
15.0000 mL | Freq: Once | OROMUCOSAL | Status: AC
  Administered 2023-08-12: 15 mL via ORAL
  Filled 2023-08-12: qty 15

## 2023-08-12 MED ORDER — ATORVASTATIN CALCIUM 40 MG PO TABS
40.0000 mg | ORAL_TABLET | Freq: Every day | ORAL | Status: DC
  Administered 2023-08-12 – 2023-08-14 (×3): 40 mg via ORAL
  Filled 2023-08-12 (×3): qty 1

## 2023-08-12 MED ORDER — GABAPENTIN 300 MG PO CAPS
600.0000 mg | ORAL_CAPSULE | Freq: Three times a day (TID) | ORAL | Status: DC
  Administered 2023-08-12 – 2023-08-15 (×7): 600 mg via ORAL
  Filled 2023-08-12 (×7): qty 2

## 2023-08-12 MED ORDER — LORAZEPAM 2 MG/ML IJ SOLN
1.0000 mg | INTRAMUSCULAR | Status: AC | PRN
  Administered 2023-08-13 (×3): 2 mg via INTRAVENOUS
  Administered 2023-08-14: 1 mg via INTRAVENOUS
  Filled 2023-08-12 (×4): qty 1

## 2023-08-12 MED ORDER — SODIUM CHLORIDE 0.9 % IV BOLUS
1000.0000 mL | Freq: Once | INTRAVENOUS | Status: AC
  Administered 2023-08-12: 1000 mL via INTRAVENOUS

## 2023-08-12 MED ORDER — CHLORDIAZEPOXIDE HCL 25 MG PO CAPS: 25.0000 mg | ORAL_CAPSULE | Freq: Every day | ORAL | Status: DC

## 2023-08-12 MED ORDER — ADULT MULTIVITAMIN W/MINERALS CH
1.0000 | ORAL_TABLET | Freq: Every day | ORAL | Status: DC
  Administered 2023-08-12 – 2023-08-15 (×3): 1 via ORAL
  Filled 2023-08-12 (×4): qty 1

## 2023-08-12 MED ORDER — PANTOPRAZOLE SODIUM 40 MG IV SOLR
40.0000 mg | INTRAVENOUS | Status: DC
  Administered 2023-08-12: 40 mg via INTRAVENOUS
  Filled 2023-08-12 (×2): qty 10

## 2023-08-12 MED ORDER — DIAZEPAM 5 MG/ML IJ SOLN
5.0000 mg | INTRAMUSCULAR | Status: AC
  Administered 2023-08-12: 5 mg via INTRAVENOUS
  Filled 2023-08-12: qty 2

## 2023-08-12 MED ORDER — LAMOTRIGINE 25 MG PO TABS
50.0000 mg | ORAL_TABLET | Freq: Two times a day (BID) | ORAL | Status: DC
  Administered 2023-08-12 – 2023-08-15 (×6): 50 mg via ORAL
  Filled 2023-08-12 (×6): qty 2

## 2023-08-12 MED ORDER — CHLORDIAZEPOXIDE HCL 25 MG PO CAPS
25.0000 mg | ORAL_CAPSULE | Freq: Three times a day (TID) | ORAL | Status: AC
  Administered 2023-08-13 – 2023-08-14 (×2): 25 mg via ORAL
  Filled 2023-08-12 (×2): qty 1

## 2023-08-12 MED ORDER — THIAMINE MONONITRATE 100 MG PO TABS
100.0000 mg | ORAL_TABLET | Freq: Every day | ORAL | Status: DC
  Administered 2023-08-13 – 2023-08-15 (×2): 100 mg via ORAL
  Filled 2023-08-12 (×3): qty 1

## 2023-08-12 MED ORDER — CHLORDIAZEPOXIDE HCL 25 MG PO CAPS
25.0000 mg | ORAL_CAPSULE | Freq: Four times a day (QID) | ORAL | Status: AC
  Administered 2023-08-12 – 2023-08-13 (×4): 25 mg via ORAL
  Filled 2023-08-12 (×4): qty 1

## 2023-08-12 MED ORDER — ACETAMINOPHEN 325 MG PO TABS: 650.0000 mg | ORAL_TABLET | Freq: Four times a day (QID) | ORAL | Status: DC | PRN

## 2023-08-12 MED ORDER — DIAZEPAM 5 MG/ML IJ SOLN
10.0000 mg | INTRAMUSCULAR | Status: AC
  Administered 2023-08-12: 10 mg via INTRAVENOUS
  Filled 2023-08-12: qty 2

## 2023-08-12 MED ORDER — LORAZEPAM 2 MG/ML IJ SOLN
0.0000 mg | Freq: Four times a day (QID) | INTRAMUSCULAR | Status: DC
  Administered 2023-08-12: 3 mg via INTRAVENOUS
  Filled 2023-08-12: qty 2

## 2023-08-12 MED ORDER — TRAZODONE HCL 100 MG PO TABS
300.0000 mg | ORAL_TABLET | Freq: Every day | ORAL | Status: DC
  Administered 2023-08-12 – 2023-08-14 (×3): 300 mg via ORAL
  Filled 2023-08-12 (×3): qty 3

## 2023-08-12 MED ORDER — LOPERAMIDE HCL 2 MG PO CAPS: 2.0000 mg | ORAL_CAPSULE | ORAL | Status: AC | PRN

## 2023-08-12 MED ORDER — SENNOSIDES-DOCUSATE SODIUM 8.6-50 MG PO TABS: 1.0000 | ORAL_TABLET | Freq: Every evening | ORAL | Status: DC | PRN

## 2023-08-12 MED ORDER — CHLORDIAZEPOXIDE HCL 25 MG PO CAPS
25.0000 mg | ORAL_CAPSULE | ORAL | Status: AC
  Administered 2023-08-14 – 2023-08-15 (×2): 25 mg via ORAL
  Filled 2023-08-12 (×2): qty 1

## 2023-08-12 MED ORDER — ONDANSETRON HCL 4 MG/2ML IJ SOLN
4.0000 mg | Freq: Once | INTRAMUSCULAR | Status: AC
  Administered 2023-08-12: 4 mg via INTRAVENOUS
  Filled 2023-08-12: qty 2

## 2023-08-12 MED ORDER — LORAZEPAM 2 MG/ML IJ SOLN: 0.0000 mg | Freq: Two times a day (BID) | INTRAMUSCULAR | Status: DC

## 2023-08-12 NOTE — ED Triage Notes (Signed)
Pt here from home with c/o N/V due to alcohol abuse , states that his last drink was this morning
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
## 2023-08-12 NOTE — ED Provider Notes (Signed)
[place] EMERGENCY DEPARTMENT AT [facility] Provider Note   CSN: [id number] Arrival date & time: 08/12/23  1348     History  Chief Complaint  Patient presents with   Emesis    [name] is a 43 y.o. adult.  43 year old individual with history of alcohol abuse who presents emergency department due to concerns for alcohol withdrawals.  Says that their last drink was yesterday at 6 PM.  Typically drinks over a gallon of wine or beer a day.  Has had alcohol withdrawals complicated by seizures before.  Since yesterday he started having some tremors, nausea and vomiting and diarrhea, as well as seeing spots.  No other hallucinations.  Also has noticed some black stools but is not on blood thinners.  Not on iron.  Not taking Pepto-Bismol. Reports that he thinks he has cirrhosis.        Home Medications Prior to Admission medications   Medication Sig Start Date End Date Taking? Authorizing Provider  amLODipine (NORVASC) 5 MG tablet Take 1 tablet (5 mg total) by mouth daily. 01/18/23   Kathleen Lime, MD  atorvastatin (LIPITOR) 40 MG tablet Take 1 tablet (40 mg total) by mouth daily. Patient taking differently: Take 40 mg by mouth at bedtime. 01/18/23   Kathleen Lime, MD  busPIRone (BUSPAR) 10 MG tablet Take 1 tablet (10 mg total) by mouth 3 (three) times daily. 01/07/23   Clapacs, Jackquline Denmark, MD  busPIRone (BUSPAR) 7.5 MG tablet Take 7.5 mg by mouth 2 (two) times daily.    [provider]  diclofenac Sodium (VOLTAREN) 1 % GEL Apply 2 g topically 4 (four) times daily. 01/18/23   Kathleen Lime, MD  divalproex (DEPAKOTE) 500 MG DR tablet Take 1 tablet (500 mg total) by mouth every 12 (twelve) hours. Patient not taking: Reported on 04/14/2023 01/07/23 04/14/23  Clapacs, Jackquline Denmark, MD  doxycycline (VIBRAMYCIN) 50 MG capsule Take 1 capsule (50 mg total) by mouth 2 (two) times daily. 02/10/23   Kathleen Lime, MD  fluticasone (FLONASE) 50 MCG/ACT nasal spray Place 1 spray into both  nostrils daily. 05/19/23 05/18/24  Philomena Doheny, MD  gabapentin (NEURONTIN) 300 MG capsule Take 1 capsule (300 mg total) by mouth 2 (two) times daily AND 2 capsules (600 mg total) at bedtime. Patient taking differently: Take 300 mg by mouth 3 times daily 03/16/23   Angelina Sheriff, DO  HYDROcodone-acetaminophen (NORCO) 5-325 MG tablet Take 1 tablet by mouth every 6 (six) hours as needed for moderate pain. 03/30/23   Modena Slater, DO  HYDROcodone-acetaminophen (NORCO) 5-325 MG tablet Take 1 tablet by mouth 3 (three) times daily as needed. To  be taken after surgery 04/19/23   Cristie Hem, PA-C  HYDROcodone-acetaminophen [facility]) 5-325 MG tablet Take 1 tablet by mouth every 6 (six) hours as needed. 04/25/23   Tarry Kos, MD  HYDROcodone-acetaminophen (NORCO) 7.5-325 MG tablet Take 1 tablet by mouth 3 (three) times daily as needed for moderate pain (pain score 4-6). 05/10/23   Cristie Hem, PA-C  hydrOXYzine (ATARAX) 50 MG tablet Take 50 mg by mouth 3 (three) times daily.    [provider]  lamoTRIgine (LAMICTAL) 25 MG tablet Take 25 mg by mouth daily.    [provider]  methocarbamol (ROBAXIN-750) 750 MG tablet Take 1 tablet (750 mg total) by mouth 2 (two) times daily as needed for muscle spasms. 05/10/23   Cristie Hem, PA-C  Multiple Vitamins-Minerals (PRESERVISION AREDS) CAPS Take 1 capsule by  mouth 2 (two) times daily.    [provider]  neomycin-bacitracin-polymyxin [phone] OINT Apply 1 Application topically 2 (two) times daily. 01/07/23   Clapacs, Jackquline Denmark, MD  ondansetron (ZOFRAN) 4 MG tablet Take 1-2 tablets (4-8 mg total) by mouth every 8 (eight) hours as needed for nausea or vomiting. 04/25/23   Tarry Kos, MD  prazosin (MINIPRESS) 1 MG capsule Take 1 mg by mouth at bedtime.    [provider]  risperiDONE (RISPERDAL) 1 MG tablet Take 1 mg by mouth at bedtime.    [provider]  thiamine (VITAMIN B-1) 100 MG tablet  Take 1 tablet (100 mg total) by mouth daily. 01/08/23   Clapacs, Jackquline Denmark, MD  traZODone (DESYREL) 100 MG tablet Take 1 tablet (100 mg total) by mouth at bedtime as needed for sleep. 01/07/23   Clapacs, Jackquline Denmark, MD  traZODone (DESYREL) 150 MG tablet Take 300 mg by mouth at bedtime.    [provider]      Allergies    Codeine, Penicillins, Morphine, Coconut (cocos nucifera), and Grapefruit concentrate    Review of Systems   Review of Systems  Physical Exam Updated Vital Signs BP (!) 144/95 (BP Location: Right Arm)   Pulse (!) 112   Temp 97.8 F (36.6 C) (Oral)   Resp 18   Ht 5\' 6"  (1.676 m)   Wt 74.8 kg   SpO2 99%   BMI 26.63 kg/m  Physical Exam Vitals and nursing note reviewed.  Constitutional:      General: He is not in acute distress.    Appearance: He is well-developed.  HENT:     Head: Normocephalic and atraumatic.     Right Ear: External ear normal.     Left Ear: External ear normal.     Nose: Nose normal.  Eyes:     Extraocular Movements: Extraocular movements intact.     Conjunctiva/sclera: Conjunctivae normal.     Pupils: Pupils are equal, round, and reactive to light.  Cardiovascular:     Rate and Rhythm: Regular rhythm. Tachycardia present.  Pulmonary:     Effort: Pulmonary effort is normal. No respiratory distress.     Breath sounds: Normal breath sounds.  Abdominal:     General: There is no distension.     Palpations: Abdomen is soft. There is no mass.     Tenderness: There is abdominal tenderness (Mild, diffuse). There is no guarding.  Musculoskeletal:     Cervical back: Normal range of motion and neck supple.  Skin:    General: Skin is warm and dry.  Neurological:     Mental Status: He is alert and oriented to person, place, and time. Mental status is at baseline.     Motor: No weakness.     Comments: Mild tongue fasciculations and tremor  Psychiatric:        Mood and Affect: Mood normal.        Behavior: Behavior normal.     ED Results /  Procedures / Treatments   Labs (all labs ordered are listed, but only abnormal results are displayed) Labs Reviewed  COMPREHENSIVE METABOLIC PANEL - Abnormal; Notable for the following components:      Result Value   Chloride 96 (*)    CO2 21 (*)    Glucose, Bld 117 (*)    Calcium 8.7 (*)    Total Protein 8.5 (*)    AST 69 (*)    Anion gap 18 (*)  All other components within normal limits  ETHANOL - Abnormal; Notable for the following components:   Alcohol, Ethyl (B) 22 (*)    All other components within normal limits  POC OCCULT BLOOD, ED - Abnormal; Notable for the following components:   Fecal Occult Bld POSITIVE (*)    All other components within normal limits  LIPASE, BLOOD  CBC  URINALYSIS, ROUTINE W REFLEX MICROSCOPIC  MAGNESIUM  PROTIME-INR    EKG EKG Interpretation Date/Time:  Friday August 12 2023 15:15:30 EST Ventricular Rate:  94 PR Interval:  119 QRS Duration:  94 QT Interval:  364 QTC Calculation: 456 R Axis:   73  Text Interpretation: Sinus rhythm Borderline short PR interval Anteroseptal infarct, age indeterminate Confirmed by Vonita Moss [phone]) on 08/12/2023 4:16:43 PM  Radiology No results found.  Procedures Procedures    Medications Ordered in ED Medications  pantoprazole (PROTONIX) injection 40 mg (40 mg Intravenous Given 08/12/23 1645)    Followed by  pantoprazole (PROTONIX) injection 40 mg (has no administration in time range)  thiamine (VITAMIN B1) tablet 100 mg (has no administration in time range)    Or  thiamine (VITAMIN B1) injection 100 mg (has no administration in time range)  folic acid (FOLVITE) tablet 1 mg (has no administration in time range)  multivitamin with minerals tablet 1 tablet (has no administration in time range)  LORazepam (ATIVAN) injection 0-4 mg (has no administration in time range)    Followed by  LORazepam (ATIVAN) injection 0-4 mg (has no administration in time range)  diazepam (VALIUM) injection 10 mg  (10 mg Intravenous Given 08/12/23 1518)  diazepam (VALIUM) injection 5 mg (5 mg Intravenous Given 08/12/23 1646)  sodium chloride 0.9 % bolus 1,000 mL (1,000 mLs Intravenous New Bag/Given 08/12/23 1649)  ondansetron (ZOFRAN) injection 4 mg (4 mg Intravenous Given 08/12/23 1644)    ED Course/ Medical Decision Making/ A&P Clinical Course as of 08/12/23 1714  Fri Aug 12, 2023  1638 Dr Levora Angel from GI consulted.  [RP]  1712 Dr Kirke Corin from hospitalist to evaluate for admission. [RP]    Clinical Course User Index [RP] Rondel Baton, MD                                 Medical Decision Making Amount and/or Complexity of Data Reviewed Labs: ordered.  Risk OTC drugs. Prescription drug management. Decision regarding hospitalization.   Brayan Votaw is a 43 y.o. adult with comorbidities that complicate the patient evaluation including alcohol abuse and reported cirrhosis who presents emergency department with concern for alcohol withdrawal and melena  Initial Ddx:  Alcohol withdrawal, electrolyte abnormality, GI bleeding, gastroenteritis  MDM/Course:  Patient presents to the emergency department with nausea, vomiting, diarrhea, tremor, and seeing spots in the setting of not taking alcohol.  Thinks they are in alcohol withdrawal at this time.  On exam has mild generalized tenderness to palpation of his abdomen but no rebound or guarding.  Does have some hand tremors and tongue fasciculations.  Does have Hemoccult positive stool.  Was given 15 mg of Valium and alcohol withdrawal symptoms appear to be under control.  I do not see a formal evaluation for cirrhosis for the patient.  Given the alcohol withdrawal complaints as well as the concerns for possible cirrhosis with a GI bleed we will go ahead and admit to hospitalist at this time.  Discussed with GI who will see the patient shortly. Upon  re-evaluation patient stable.  This patient presents to the ED for concern of complaints listed in  HPI, this involves an extensive number of treatment options, and is a complaint that carries with it a high risk of complications and morbidity. Disposition including potential need for admission considered.   Dispo: Admit to Floor  Records reviewed Outpatient Clinic Notes The following labs were independently interpreted: Chemistry and show no acute abnormality I personally reviewed and interpreted cardiac monitoring: sinus tachycardia I personally reviewed and interpreted the pt's EKG: see above for interpretation  I have reviewed the patients home medications and made adjustments as needed Consults: Gastroenterology and Hospitalist Social Determinants of health:  Etoh abuse  Portions of this note were generated with Scientist, clinical (histocompatibility and immunogenetics). Dictation errors may occur despite best attempts at proofreading.     Final Clinical Impression(s) / ED Diagnoses Final diagnoses:  Nausea vomiting and diarrhea  Gastrointestinal hemorrhage, unspecified gastrointestinal hemorrhage type  Alcohol withdrawal syndrome without complication [facility])    Rx / DC Orders ED Discharge Orders     None         Rondel Baton, MD 08/12/23 1714

## 2023-08-13 DIAGNOSIS — K922 Gastrointestinal hemorrhage, unspecified: Secondary | ICD-10-CM | POA: Diagnosis not present

## 2023-08-13 DIAGNOSIS — K921 Melena: Secondary | ICD-10-CM | POA: Diagnosis not present

## 2023-08-13 DIAGNOSIS — F10930 Alcohol use, unspecified with withdrawal, uncomplicated: Secondary | ICD-10-CM | POA: Diagnosis not present

## 2023-08-13 LAB — URINALYSIS, ROUTINE W REFLEX MICROSCOPIC
Bacteria, UA: NONE SEEN
Bilirubin Urine: NEGATIVE
Glucose, UA: NEGATIVE mg/dL
Hgb urine dipstick: NEGATIVE
Ketones, ur: 5 mg/dL — AB
Leukocytes,Ua: NEGATIVE
Nitrite: NEGATIVE
Protein, ur: 30 mg/dL — AB
Specific Gravity, Urine: 1.013 (ref 1.005–1.030)
pH: 7 (ref 5.0–8.0)

## 2023-08-13 LAB — COMPREHENSIVE METABOLIC PANEL
ALT: 28 U/L (ref 0–44)
AST: 49 U/L — ABNORMAL HIGH (ref 15–41)
Albumin: 3.3 g/dL — ABNORMAL LOW (ref 3.5–5.0)
Alkaline Phosphatase: 61 U/L (ref 38–126)
Anion gap: 9 (ref 5–15)
BUN: 9 mg/dL (ref 6–20)
CO2: 28 mmol/L (ref 22–32)
Calcium: 8.4 mg/dL — ABNORMAL LOW (ref 8.9–10.3)
Chloride: 100 mmol/L (ref 98–111)
Creatinine, Ser: 0.86 mg/dL (ref 0.61–1.24)
GFR, Estimated: >60 mL/min (ref >60)
Glucose, Bld: 90 mg/dL (ref 70–99)
Potassium: 3.7 mmol/L (ref 3.5–5.1)
Sodium: 137 mmol/L (ref 135–145)
Total Bilirubin: 1.4 mg/dL — ABNORMAL HIGH (ref 0.0–1.2)
Total Protein: 7 g/dL (ref 6.5–8.1)

## 2023-08-13 LAB — RAPID URINE DRUG SCREEN, HOSP PERFORMED
Amphetamines: NOT DETECTED
Barbiturates: NOT DETECTED
Benzodiazepines: POSITIVE — AB
Cocaine: POSITIVE — AB
Opiates: NOT DETECTED
Tetrahydrocannabinol: NOT DETECTED

## 2023-08-13 LAB — CBC
HCT: 37.6 % — ABNORMAL LOW (ref 39.0–52.0)
Hemoglobin: 12.6 g/dL — ABNORMAL LOW (ref 13.0–17.0)
MCH: 29 pg (ref 26.0–34.0)
MCHC: 33.5 g/dL (ref 30.0–36.0)
MCV: 86.6 fL (ref 80.0–100.0)
Platelets: 148 10*3/uL — ABNORMAL LOW (ref 150–400)
RBC: 4.34 MIL/uL (ref 4.22–5.81)
RDW: 15 % (ref 11.5–15.5)
WBC: 3.7 10*3/uL — ABNORMAL LOW (ref 4.0–10.5)
nRBC: 0 % (ref 0.0–0.2)

## 2023-08-13 LAB — MAGNESIUM: Magnesium: 1.9 mg/dL (ref 1.7–2.4)

## 2023-08-13 LAB — PHOSPHORUS: Phosphorus: 2.7 mg/dL (ref 2.5–4.6)

## 2023-08-13 MED ORDER — ORAL CARE MOUTH RINSE: 15.0000 mL | OROMUCOSAL | Status: DC | PRN
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
## 2023-08-13 NOTE — Progress Notes (Addendum)
PROGRESS NOTE                                                                                                                                                                                                             Patient Demographics:    [name], is a 43 y.o. adult, DOB - July 22, 1980, WUJ:[id number]  Outpatient Primary MD for the patient is Kathleen Lime, MD    LOS - 1  Admit date - 08/12/2023    Chief Complaint  Patient presents with   Emesis       Brief Narrative (HPI from H&P)   [name] is a 43 y.o. adult with medical history significant for alcohol use disorder with alcohol withdrawal seizures, bipolar 1 disorder, CAD, anxiety and depression, suicidal attempt, PTSD, HLD, HTN, schizophrenia,  renal cell carcinoma  s/p partial nephrectomy, lymphadenopathy and chronic left shoulder pain who presented to the ED due to concern for alcohol withdrawal.  He was admitted for alcohol withdrawal.  He also reported few days of melena and found to have positive fecal occult test so GI was consulted for evaluation.   Subjective:    [name] was evaluated at the bed side.  He is slightly drowsy after just waking up.  He reports feeling better compared to yesterday.  States he was seen by GI and waiting to see if he can eat today.  CIWA scoring down to 4 overnight.  Tremors, nausea and vomiting have improved.   Assessment  & Plan :    Assessment and Plan: # Alcohol withdrawal # EtOH use disorder Remains slightly drowsy this morning but overall feels better. CIWA scores are improved with his last 3 scores at 4.  He remains tachycardic but vitals are overall improving.  No electrolyte abnormalities. -Continue CIWA protocol with Ativan -Continue Librium taper -Multivitamin, folate and thiamine -TOC consulted for substance abuse resources, appreciate assistance   # Melena # Abdominal pain Reports generalized  abdominal pain for the last few days of dark stools. Denies any hemoptysis or hematochezia. Fecal occult positive in the ED. slight drop in hemoglobin from 14.6-12.6 this morning. Possible some dilutional effect with WBC and platelet also dropping. -GI following, plan for EGD tomorrow -Continue IV Protonix 40 mg every 12 hours -IV Dilaudid as needed for pain -Trend CBC   # HTN BP improved  with SBP in the 120s to 140s. -Continue amlodipine   # PTSD # Bipolar disorder # Anxiety and depression # Schizophrenia Does not report any SI, HI or hallucinations. -Continue all psych meds including risperidone, trazodone, prazosin, lamotrigine and buspirone -Outpatient follow-up with psychiatry   # HLD # CAD -Continue atorvastatin   # Peripheral neuropathy EtOH induced -Continue gabapentin   # Renal cell carcinoma s/p partial nephrectomy in 2014 # Lymphadenopathy CT A/P last week showed similar prominent lymph nodes in the groin regions bilaterally, partial left nephrectomy without evidence of local recurrence and similar subcutaneous soft tissue nodularity compatible with neoplasm/metastasis. -Continue to follow-up with oncology in the outpatient       Condition -improving  Family Communication  : No family at bedside  Code Status : Full code  Consults  : Gastroenterology  PUD Prophylaxis : Protonix   Procedures  :     None      Disposition Plan  :    Status is: Inpatient Remains inpatient appropriate because: Management of alcohol withdrawal and melena  DVT Prophylaxis  :    SCDs Start: 08/12/23 1818     Lab Results  Component Value Date   PLT 148 (L) 08/13/2023    Diet :  Diet Order             Diet full liquid Fluid consistency: Thin  Diet effective now                    Inpatient Medications  Scheduled Meds:  amLODipine  5 mg Oral Daily   atorvastatin  40 mg Oral QHS   busPIRone  15 mg Oral TID   chlordiazePOXIDE  25 mg Oral QID    Followed by   chlordiazePOXIDE  25 mg Oral TID   Followed by   Melene Muller ON 08/14/2023] chlordiazePOXIDE  25 mg Oral BH-qamhs   Followed by   Melene Muller ON 08/15/2023] chlordiazePOXIDE  25 mg Oral Daily   folic acid  1 mg Oral Daily   gabapentin  600 mg Oral TID   lamoTRIgine  50 mg Oral BID   multivitamin with minerals  1 tablet Oral Daily   pantoprazole (PROTONIX) IV  40 mg Intravenous Q12H   prazosin  1 mg Oral QHS   risperiDONE  1 mg Oral QHS   thiamine  100 mg Oral Daily   Or   thiamine  100 mg Intravenous Daily   traZODone  300 mg Oral QHS   Continuous Infusions: PRN Meds:.acetaminophen **OR** acetaminophen, HYDROmorphone (DILAUDID) injection, loperamide, LORazepam **OR** LORazepam, ondansetron **OR** ondansetron (ZOFRAN) IV, senna-docusate  Antibiotics  :    Anti-infectives (From admission, onward)    None         Objective:   Vitals:   08/13/23 0051 08/13/23 0057 08/13/23 0200 08/13/23 0425  BP: 124/81 (!) 146/83 120/83 (!) 142/87  Pulse: (!) 122 88 (!) 110 (!) 117  Resp: 16 20  20   Temp: 98.3 F (36.8 C) 98.3 F (36.8 C)    TempSrc: Oral     SpO2: 91% 98%  96%  Weight:      Height:        Wt Readings from Last 3 Encounters:  08/12/23 74.8 kg  05/19/23 76.5 kg  04/25/23 72.6 kg     Intake/Output Summary (Last 24 hours) at 08/13/2023 1001 Last data filed at 08/12/2023 2230 Gross per 24 hour  Intake 480 ml  Output 500 ml  Net -20 ml  Physical Exam  General: Pleasant, acutely ill-appearing middle-age man laying in bed. No acute distress. HEENT: [place]/AT. Anicteric sclera CV: Tachycardic.  Regular rhythm. No murmurs, rubs, or gallops. No LE edema Pulmonary: Lungs CTAB. Normal effort. No wheezing or rales. Abdominal: Soft, NT/ND. Normal bowel sounds. Extremities: Palpable radial and DP pulses. Normal ROM. Skin: Warm and dry. No obvious rash or lesions. Neuro: Slightly drowsy but oriented x 3. Moves all extremities. Normal sensation to light touch.  No  tremors. No focal deficit. Psych: Normal mood and affect    RN pressure injury documentation:      Data Review:    Recent Labs  Lab 08/12/23 1417 08/13/23 0546  WBC 5.2 3.7*  HGB 14.6 12.6*  HCT 42.4 37.6*  PLT 173 148*  MCV 85.0 86.6  MCH 29.3 29.0  MCHC 34.4 33.5  RDW 15.1 15.0    Recent Labs  Lab 08/12/23 1417 08/12/23 1652 08/13/23 0546  NA 135  --  137  K 3.9  --  3.7  CL 96*  --  100  CO2 21*  --  28  ANIONGAP 18*  --  9  GLUCOSE 117*  --  90  BUN 12  --  9  CREATININE 0.76  --  0.86  AST 69*  --  49*  ALT 17  --  28  ALKPHOS 77  --  61  BILITOT 0.8  --  1.4*  ALBUMIN 4.0  --  3.3*  INR  --  1.0  --   MG  --   --  1.9  CALCIUM 8.7*  --  8.4*      Recent Labs  Lab 08/12/23 1417 08/12/23 1652 08/13/23 0546  INR  --  1.0  --   MG  --   --  1.9  CALCIUM 8.7*  --  8.4*    --------------------------------------------------------------------------------------------------------------- Lab Results  Component Value Date   CHOL 280 (H) 11/19/2021   HDL 68 11/19/2021   LDLCALC 177 (H) 11/19/2021   TRIG 176 (H) 11/19/2021   CHOLHDL 4.1 11/19/2021    Lab Results  Component Value Date   HGBA1C 4.5 (L) 09/19/2021   No results for input(s): "TSH", "T4TOTAL", "FREET4", "T3FREE", "THYROIDAB" in the last 72 hours. No results for input(s): "VITAMINB12", "FOLATE", "FERRITIN", "TIBC", "IRON", "RETICCTPCT" in the last 72 hours. ------------------------------------------------------------------------------------------------------------------ Cardiac Enzymes No results for input(s): "CKMB", "TROPONINI", "MYOGLOBIN" in the last 168 hours.  Invalid input(s): "CK"  Micro Results No results found for this or any previous visit (from the past 240 hours).  Radiology Reports No results found.    Signature  -   Steffanie Rainwater M.D on 08/13/2023 at 10:01 AM   -  To page go to [email or web address]

## 2023-08-14 ENCOUNTER — Inpatient Hospital Stay (HOSPITAL_COMMUNITY): Payer: MEDICAID | Admitting: Anesthesiology

## 2023-08-14 ENCOUNTER — Encounter (HOSPITAL_COMMUNITY)
Admission: EM | Disposition: A | Discharge status: Home / Self Care | Payer: Self-pay | Source: Home / Self Care | Attending: Family Medicine

## 2023-08-14 ENCOUNTER — Encounter (HOSPITAL_COMMUNITY): Payer: Self-pay | Admitting: Student

## 2023-08-14 DIAGNOSIS — K2960 Other gastritis without bleeding: Secondary | ICD-10-CM | POA: Diagnosis not present

## 2023-08-14 DIAGNOSIS — I2510 Atherosclerotic heart disease of native coronary artery without angina pectoris: Secondary | ICD-10-CM

## 2023-08-14 DIAGNOSIS — F418 Other specified anxiety disorders: Secondary | ICD-10-CM

## 2023-08-14 DIAGNOSIS — I10 Essential (primary) hypertension: Secondary | ICD-10-CM

## 2023-08-14 DIAGNOSIS — K922 Gastrointestinal hemorrhage, unspecified: Secondary | ICD-10-CM | POA: Diagnosis not present

## 2023-08-14 HISTORY — PX: ESOPHAGOGASTRODUODENOSCOPY (EGD) WITH PROPOFOL: SHX5813

## 2023-08-14 HISTORY — PX: BIOPSY: SHX5522

## 2023-08-14 SURGERY — ESOPHAGOGASTRODUODENOSCOPY (EGD) WITH PROPOFOL
Anesthesia: Monitor Anesthesia Care

## 2023-08-14 MED ORDER — LIDOCAINE HCL (CARDIAC) PF 100 MG/5ML IV SOSY
PREFILLED_SYRINGE | INTRAVENOUS | Status: DC | PRN
  Administered 2023-08-14: 100 mg via INTRAVENOUS

## 2023-08-14 MED ORDER — SODIUM CHLORIDE 0.9 % IV SOLN: INTRAVENOUS | Status: DC | PRN

## 2023-08-14 MED ORDER — PROPOFOL 500 MG/50ML IV EMUL
INTRAVENOUS | Status: DC | PRN
  Administered 2023-08-14: 125 ug/kg/min via INTRAVENOUS

## 2023-08-14 MED ORDER — PROPOFOL 10 MG/ML IV BOLUS
INTRAVENOUS | Status: DC | PRN
  Administered 2023-08-14: 80 mg via INTRAVENOUS
  Administered 2023-08-14: 40 mg via INTRAVENOUS

## 2023-08-14 SURGICAL SUPPLY — 14 items
BLOCK BITE 60FR ADLT L/F BLUE (MISCELLANEOUS) ×2 IMPLANT
ELECT REM PT RETURN 9FT ADLT (ELECTROSURGICAL)
ELECTRODE REM PT RTRN 9FT ADLT (ELECTROSURGICAL) IMPLANT
FORCEP RJ3 GP 1.8X160 W-NEEDLE (CUTTING FORCEPS) IMPLANT
FORCEPS BIOP RAD 4 LRG CAP 4 (CUTTING FORCEPS) IMPLANT
NDL SCLEROTHERAPY 25GX240 (NEEDLE) IMPLANT
NEEDLE SCLEROTHERAPY 25GX240 (NEEDLE) IMPLANT
PROBE APC STR FIRE (PROBE) IMPLANT
PROBE INJECTION GOLD 7FR (MISCELLANEOUS) IMPLANT
SNARE SHORT THROW 13M SML OVAL (MISCELLANEOUS) IMPLANT
SYR 50ML LL SCALE MARK (SYRINGE) IMPLANT
TUBING ENDO SMARTCAP PENTAX (MISCELLANEOUS) ×4 IMPLANT
TUBING IRRIGATION ENDOGATOR (MISCELLANEOUS) ×2 IMPLANT
WATER STERILE IRR 1000ML POUR (IV SOLUTION) IMPLANT

## 2023-08-14 NOTE — Brief Op Note (Signed)
08/14/2023  9:37 AM  PATIENT:  [name]  43 y.o. adult  PRE-OPERATIVE DIAGNOSIS:  Melena  POST-OPERATIVE DIAGNOSIS:  gastritis  PROCEDURE:  Procedure(s): ESOPHAGOGASTRODUODENOSCOPY (EGD) WITH PROPOFOL (N/A) BIOPSY  SURGEON:  Surgeons and Role:    * Saliah Crisp, MD - Primary  Findings ----------- -EGD showed severe erosive gastritis.  No evidence of varices.  Biopsies taken.  No active bleeding.  Recommendations ----------------------- -Advance diet -Recommend Protonix 40 mg twice a day for 4 weeks and then Protonix 40 mg once a day -Avoid NSAIDs and alcohol -No further inpatient GI workup planned.  GI will sign off.  Okay to discharge from GI standpoint.  Kathi Der MD, FACP 08/14/2023, 9:37 AM  Contact #  [phone]

## 2023-08-14 NOTE — TOC Progression Note (Signed)
Transition of Care [facility]) - Progression Note    Patient Details  Name: [name] MRN: [id number] Date of Birth: 1981/05/02  [facility]) CM/SW Contact  Coralyn Helling, [place] Phone Number: 08/14/2023, 11:25 AM  Clinical Narrative:   Etoh resources added to AVS.          Expected Discharge Plan and Services                                                Social Determinants of Health (SDOH) Interventions SDOH Screenings   Food Insecurity: No Food Insecurity (08/12/2023)  Housing: High Risk (08/12/2023)  Transportation Needs: No Transportation Needs (08/12/2023)  Utilities: Not At Risk (08/12/2023)  Alcohol Screen: High Risk (01/01/2023)  Depression (PHQ2-9): Low Risk  (05/19/2023)  Financial Resource Strain: Not on File (11/05/2021)   Received from [place], [place]  Physical Activity: Not on File (11/05/2021)   Received from [place], [place]  Social Connections: Moderately Isolated (08/12/2023)  Stress: Not on File (11/05/2021)   Received from [facility], [place]  Tobacco Use: High Risk (08/14/2023)    Readmission Risk Interventions     No data to display

## 2023-08-14 NOTE — Transfer of Care (Addendum)
Immediate Anesthesia Transfer of Care Note  Patient: [name]  Procedure(s) Performed: ESOPHAGOGASTRODUODENOSCOPY (EGD) WITH PROPOFOL BIOPSY  Patient Location: PACU  Anesthesia Type:MAC  Level of Consciousness: drowsy and patient cooperative  Airway & Oxygen Therapy: Patient Spontanous Breathing and Patient connected to face mask oxygen  Post-op Assessment: Report given to RN and Post -op Vital signs reviewed and stable  Post vital signs: Reviewed and stable  Last Vitals:  Vitals Value Taken Time  BP 119/86 08/14/23   0940  Temp 36 C 08/14/23 0940  Pulse 93 08/14/23 0943  Resp 13 08/14/23 0943  SpO2 100 % 08/14/23 0943  Vitals shown include unfiled device data.  Last Pain:  Vitals:   08/14/23 0855  TempSrc: Temporal  PainSc: 0-No pain      Patients Stated Pain Goal: 3 (08/13/23 2054)  Complications: No notable events documented.

## 2023-08-14 NOTE — TOC Progression Note (Signed)
Transition of Care [facility]) - Progression Note    Patient Details  Name: [name] MRN: [id number] Date of Birth: [birth year]  Transition [facility]) CM/SW Contact  Maryjean Ka, [place] Phone Number: 08/14/2023, 4:10 PM  Clinical Narrative:     Per provider Elijah Birk, MD pt is PENDING discharge with a possible dc date of TOMORROW 08/15/2023. This CSW was advised that patient has an ACT team and per patient ACT contact is Judeth Cornfield; [phone]. Per Provider Caldwell,MD spoke with patient's mother who inquired about patient receiving a psych eval.   TOC to assist and follow as needed.       Expected Discharge Plan and Services                                               Social Determinants of Health (SDOH) Interventions SDOH Screenings   Food Insecurity: No Food Insecurity (08/12/2023)  Housing: High Risk (08/12/2023)  Transportation Needs: No Transportation Needs (08/12/2023)  Utilities: Not At Risk (08/12/2023)  Alcohol Screen: High Risk (01/01/2023)  Depression (PHQ2-9): Low Risk  (05/19/2023)  Financial Resource Strain: Not on File (11/05/2021)   Received from [place], [place]  Physical Activity: Not on File (11/05/2021)   Received from [place], [place]  Social Connections: Moderately Isolated (08/12/2023)  Stress: Not on File (11/05/2021)   Received from [facility], [place]  Tobacco Use: High Risk (08/14/2023)    Readmission Risk Interventions     No data to display

## 2023-08-14 NOTE — Plan of Care (Signed)
  Problem: Health Behavior/Discharge Planning: Goal: Ability to manage health-related needs will improve Outcome: Progressing   Problem: Clinical Measurements: Goal: Ability to maintain clinical measurements within normal limits will improve Outcome: Progressing   Problem: Nutrition: Goal: Adequate nutrition will be maintained Outcome: Progressing   Problem: Pain Managment: Goal: General experience of comfort will improve and/or be controlled Outcome: Progressing

## 2023-08-14 NOTE — Anesthesia Preprocedure Evaluation (Addendum)
Anesthesia Evaluation  Patient identified by MRN, date of birth, ID band Patient awake    Reviewed: Allergy & Precautions, H&P , NPO status , [name] Chart, lab work & pertinent test results  History of Anesthesia Complications (+) DIFFICULT AIRWAY and history of anesthetic complications  Airway Mallampati: I  TM Distance: >3 FB Neck ROM: Full    Dental no notable dental hx. (+) Edentulous Upper, Edentulous Lower, Dental Advisory Given   Pulmonary Current Smoker and Patient abstained from smoking.   Pulmonary exam normal breath sounds clear to auscultation       Cardiovascular hypertension, Pt. on medications + CAD and + Peripheral Vascular Disease   Rhythm:Regular Rate:Normal     Neuro/Psych  Headaches, Seizures -, Well Controlled,   Anxiety Depression Bipolar Disorder      GI/Hepatic negative GI ROS, Neg liver ROS,,,  Endo/Other  negative endocrine ROS    Renal/GU negative Renal ROS  negative genitourinary   Musculoskeletal   Abdominal   Peds  Hematology negative hematology ROS (+)   Anesthesia Other Findings   Reproductive/Obstetrics negative OB ROS                             Anesthesia Physical Anesthesia Plan  ASA: 3  Anesthesia Plan: MAC   Post-op Pain Management: Minimal or no pain anticipated   Induction: Intravenous  PONV Risk Score and Plan: 0 and Propofol infusion  Airway Management Planned: Natural Airway and Simple Face Mask  Additional Equipment:   Intra-op Plan:   Post-operative Plan:   Informed Consent: I have reviewed the patients History and Physical, chart, labs and discussed the procedure including the risks, benefits and alternatives for the proposed anesthesia with the patient or authorized representative who has indicated his/her understanding and acceptance.     Dental advisory given  Plan Discussed with: CRNA  Anesthesia Plan Comments:         Anesthesia Quick Evaluation

## 2023-08-14 NOTE — Op Note (Signed)
[facility] Patient Name: [name] Procedure Date: 08/14/2023 MRN: [id number] Attending MD: Kathi Der , MD, [id number] Date of Birth: 1981/06/10 CSN: [id number] Age: 43 Admit Type: Inpatient Procedure:                Upper GI endoscopy Indications:              Melena Providers:                Kathi Der, MD, Roselie Awkward, RN, Harrington Challenger, Technician Referring MD:              Medicines:                Sedation Administered by an Anesthesia Professional Complications:            No immediate complications. Estimated Blood Loss:     Estimated blood loss was minimal. Procedure:                Pre-Anesthesia Assessment:                           - Prior to the procedure, a History and Physical                            was performed, and patient medications and                            allergies were reviewed. The patient's tolerance of                            previous anesthesia was also reviewed. The risks                            and benefits of the procedure and the sedation                            options and risks were discussed with the patient.                            All questions were answered, and informed consent                            was obtained. Prior Anticoagulants: The patient has                            taken no anticoagulant or antiplatelet agents. ASA                            Grade Assessment: III - A patient with severe                            systemic disease. After reviewing the risks and  benefits, the patient was deemed in satisfactory                            condition to undergo the procedure.                           After obtaining informed consent, the endoscope was                            passed under direct vision. Throughout the                            procedure, the patient's blood pressure, pulse, and                             oxygen saturations were monitored continuously. The                            [id number]-[id number] ([id number] Olympus endoscope was introduced                            through the mouth, and advanced to the second part                            of duodenum. The upper GI endoscopy was                            accomplished without difficulty. The patient                            tolerated the procedure well. Scope In: Scope Out: Findings:      The Z-line was regular and was found 38 cm from the incisors.      There is no endoscopic evidence of varices in the entire esophagus.      Segmental severe inflammation characterized by congestion (edema),       erosions, erythema and friability was found in the gastric body and in       the prepyloric region of the stomach. Biopsies were taken with a cold       forceps for histology.      The cardia and gastric fundus were normal on retroflexion.      The duodenal bulb, first portion of the duodenum and second portion of       the duodenum were normal. Impression:               - Z-line regular, 38 cm from the incisors.                           - Erosive gastritis. Biopsied.                           - Normal duodenal bulb, first portion of the                            duodenum and second portion of the duodenum. Moderate Sedation:  Moderate (conscious) sedation was personally administered by an       anesthesia professional. The following parameters were monitored: oxygen       saturation, heart rate, blood pressure, and response to care. Recommendation:           - Return patient to hospital ward for ongoing care.                           - Resume regular diet.                           - Continue present medications.                           - Await pathology results. Procedure Code(s):        --- Professional ---                           [phone], Esophagogastroduodenoscopy, flexible,                            transoral; with biopsy,  single or multiple Diagnosis Code(s):        --- Professional ---                           K29.60, Other gastritis without bleeding                           K92.1, Melena (includes Hematochezia) CPT copyright 2022 [employer] Association. All rights reserved. The codes documented in this report are preliminary and upon coder review may  be revised to meet current compliance requirements. Kathi Der, MD Kathi Der, MD 08/14/2023 9:36:52 AM Number of Addenda: 0

## 2023-08-14 NOTE — Interval H&P Note (Signed)
History and Physical Interval Note:  08/14/2023 9:16 AM  [name]  has presented today for surgery, with the diagnosis of Melena.  The various methods of treatment have been discussed with the patient and family. After consideration of risks, benefits and other options for treatment, the patient has consented to  Procedure(s): ESOPHAGOGASTRODUODENOSCOPY (EGD) WITH PROPOFOL (N/A) as a surgical intervention.  The patient's history has been reviewed, patient examined, no change in status, stable for surgery.  I have reviewed the patient's chart and labs.  Questions were answered to the patient's satisfaction.     Roxie Kreeger

## 2023-08-14 NOTE — Progress Notes (Signed)
PROGRESS NOTE    [name]  YQI:[id number] DOB: 10-Nov-1980 DOA: 08/12/2023 PCP: Kathleen Lime, MD  Chief Complaint  Patient presents with   Emesis    Brief Narrative:    43 y.o. adult with medical history significant for alcohol use disorder with alcohol withdrawal seizures, bipolar 1 disorder, CAD, anxiety and depression, suicidal attempt, PTSD, HLD, HTN, schizophrenia,  renal cell carcinoma  s/p partial nephrectomy, lymphadenopathy and chronic left shoulder pain who presented to the ED due to concern for alcohol withdrawal.  He was admitted for alcohol withdrawal.  He also reported few days of melena and found to have positive fecal occult test so GI was consulted for evaluation.   Assessment & Plan:   Active Problems:   Melena   Alcohol withdrawal (HCC)   Nausea vomiting and diarrhea   Gastrointestinal hemorrhage  # Alcohol withdrawal # EtOH use disorder - minimal withdrawal symptoms today -Continue CIWA protocol with Ativan -Continue Librium taper -Multivitamin, folate and thiamine -TOC consulted for substance abuse resources, appreciate assistance    # Melena # Abdominal pain - EGD with erosive gastritis, biopsied (follow pathology) - PPI recommended BID x 4 weeks, then daily - avoid NSAIDs and etoh   # HTN -Continue amlodipine   # PTSD # Bipolar disorder # Anxiety and depression # Schizophrenia Does not report any SI, HI or hallucinations. - patient has ACT team, his mother has concerns regarding his adherence to his psych medications, thinks he needs to go to [facility].  Discussed I don't see clear indication for any inpatient psych care, but given his complexity with act team and concern for inconsistent adherence to meds (and on lamictal) will ask psychiatry to consult -Continue all psych meds including risperidone, trazodone, prazosin, lamotrigine and buspirone -Outpatient follow-up with psychiatry   # HLD # CAD -Continue atorvastatin   # Peripheral  neuropathy EtOH induced -Continue gabapentin   # Renal cell carcinoma s/p partial nephrectomy in 2014 # Lymphadenopathy CT Orine Goga/P last week showed similar prominent lymph nodes in the groin regions bilaterally, partial left nephrectomy without evidence of local recurrence and similar subcutaneous soft tissue nodularity compatible with neoplasm/metastasis. -Continue to follow-up with oncology in the outpatient    DVT prophylaxis: SCD Code Status: full Family Communication: mother Disposition:   Status is: Inpatient Remains inpatient appropriate because: need for continued inpatient care   Consultants:  GI  Procedures:  EGD 1/26  Antimicrobials:  Anti-infectives (From admission, onward)    None       Subjective: No complaints  Objective: Vitals:   08/14/23 0945 08/14/23 0959 08/14/23 1000 08/14/23 1130  BP: 120/89 (!) 118/97 (!) 118/97 (!) 134/104  Pulse: 96 99 86 80  Resp: 12 14 15 16   Temp:    98.4 F (36.9 C)  TempSrc:    Oral  SpO2: 100% 98% 97% 97%  Weight:      Height:        Intake/Output Summary (Last 24 hours) at 08/14/2023 1601 Last data filed at 08/14/2023 1328 Gross per 24 hour  Intake 1151 ml  Output 150 ml  Net 1001 ml   Filed Weights   08/12/23 1351  Weight: 74.8 kg    Examination:  General exam: Appears calm and comfortable  Respiratory system: unlabored Cardiovascular system: RRR Gastrointestinal system: Abdomen is nondistended, soft and nontender.  Central nervous system: Alert and oriented. No focal neurological deficits. Extremities: no LEE   Data Reviewed: I have personally reviewed following labs and imaging studies  CBC:  Recent Labs  Lab 08/12/23 1417 08/13/23 0546  WBC 5.2 3.7*  HGB 14.6 12.6*  HCT 42.4 37.6*  MCV 85.0 86.6  PLT 173 148*    Basic Metabolic Panel: Recent Labs  Lab 08/12/23 1417 08/13/23 0546  NA 135 137  K 3.9 3.7  CL 96* 100  CO2 21* 28  GLUCOSE 117* 90  BUN 12 9  CREATININE 0.76 0.86   CALCIUM 8.7* 8.4*  MG  --  1.9  PHOS  --  2.7    GFR: Estimated Creatinine Clearance (by C-G formula based on SCr of 0.86 mg/dL) Male: 08.6 mL/min Male: 98.9 mL/min  Liver Function Tests: Recent Labs  Lab 08/12/23 1417 08/13/23 0546  AST 69* 49*  ALT 17 28  ALKPHOS 77 61  BILITOT 0.8 1.4*  PROT 8.5* 7.0  ALBUMIN 4.0 3.3*    CBG: No results for input(s): "GLUCAP" in the last 168 hours.   No results found for this or any previous visit (from the past 240 hours).       Radiology Studies: No results found.      Scheduled Meds:  amLODipine  5 mg Oral Daily   atorvastatin  40 mg Oral QHS   busPIRone  15 mg Oral TID   chlordiazePOXIDE  25 mg Oral TID   Followed by   chlordiazePOXIDE  25 mg Oral BH-qamhs   Followed by   Melene Muller ON 08/15/2023] chlordiazePOXIDE  25 mg Oral Daily   folic acid  1 mg Oral Daily   gabapentin  600 mg Oral TID   lamoTRIgine  50 mg Oral BID   multivitamin with minerals  1 tablet Oral Daily   pantoprazole (PROTONIX) IV  40 mg Intravenous Q12H   prazosin  1 mg Oral QHS   risperiDONE  1 mg Oral QHS   thiamine  100 mg Oral Daily   Or   thiamine  100 mg Intravenous Daily   traZODone  300 mg Oral QHS   Continuous Infusions:   LOS: 2 days    Time spent: over 30 min     Lacretia Nicks, MD [facility]   To contact the attending provider between 7A-7P or the covering provider during after hours 7P-7A, please log into the web site [email or web address] and access using universal [place] password for that web site. If you do not have the password, please call the hospital operator.  08/14/2023, 4:01 PM

## 2023-08-14 NOTE — Anesthesia Postprocedure Evaluation (Signed)
Anesthesia Post Note  Patient: [name]  Procedure(s) Performed: ESOPHAGOGASTRODUODENOSCOPY (EGD) WITH PROPOFOL BIOPSY     Patient location during evaluation: PACU Anesthesia Type: MAC Level of consciousness: awake and alert Pain management: pain level controlled Vital Signs Assessment: post-procedure vital signs reviewed and stable Respiratory status: spontaneous breathing, nonlabored ventilation, respiratory function stable and patient connected to nasal cannula oxygen Cardiovascular status: stable and blood pressure returned to baseline Postop Assessment: no apparent nausea or vomiting Anesthetic complications: no  No notable events documented.  Last Vitals:  Vitals:   08/14/23 0959 08/14/23 1000  BP: (!) 118/97 (!) 118/97  Pulse: 99 86  Resp: 14 15  Temp:    SpO2: 98% 97%    Last Pain:  Vitals:   08/14/23 1000  TempSrc:   PainSc: 0-No pain                 Kashis Penley,W. EDMOND

## 2023-08-15 ENCOUNTER — Inpatient Hospital Stay (HOSPITAL_COMMUNITY): Payer: MEDICAID

## 2023-08-15 ENCOUNTER — Encounter: Payer: Self-pay | Admitting: Medical Oncology

## 2023-08-15 ENCOUNTER — Ambulatory Visit (HOSPITAL_COMMUNITY): Payer: MEDICAID

## 2023-08-15 DIAGNOSIS — F10930 Alcohol use, unspecified with withdrawal, uncomplicated: Secondary | ICD-10-CM

## 2023-08-15 LAB — CBC
HCT: 38.2 % — ABNORMAL LOW (ref 39.0–52.0)
Hemoglobin: 12.5 g/dL — ABNORMAL LOW (ref 13.0–17.0)
MCH: 29 pg (ref 26.0–34.0)
MCHC: 32.7 g/dL (ref 30.0–36.0)
MCV: 88.6 fL (ref 80.0–100.0)
Platelets: 144 10*3/uL — ABNORMAL LOW (ref 150–400)
RBC: 4.31 MIL/uL (ref 4.22–5.81)
RDW: 14.6 % (ref 11.5–15.5)
WBC: 4.9 10*3/uL (ref 4.0–10.5)
nRBC: 0 % (ref 0.0–0.2)

## 2023-08-15 LAB — BASIC METABOLIC PANEL
Anion gap: 8 (ref 5–15)
BUN: <5 mg/dL — ABNORMAL LOW (ref 6–20)
CO2: 27 mmol/L (ref 22–32)
Calcium: 8.8 mg/dL — ABNORMAL LOW (ref 8.9–10.3)
Chloride: 101 mmol/L (ref 98–111)
Creatinine, Ser: 0.77 mg/dL (ref 0.61–1.24)
GFR, Estimated: >60 mL/min (ref >60)
Glucose, Bld: 95 mg/dL (ref 70–99)
Potassium: 3.4 mmol/L — ABNORMAL LOW (ref 3.5–5.1)
Sodium: 136 mmol/L (ref 135–145)

## 2023-08-15 LAB — MAGNESIUM: Magnesium: 1.7 mg/dL (ref 1.7–2.4)

## 2023-08-15 LAB — PHOSPHORUS: Phosphorus: 3.2 mg/dL (ref 2.5–4.6)

## 2023-08-15 MED ORDER — CHLORDIAZEPOXIDE HCL 25 MG PO CAPS: ORAL_CAPSULE | ORAL | 0 refills | Status: AC

## 2023-08-15 MED ORDER — PANTOPRAZOLE SODIUM 40 MG PO TBEC: DELAYED_RELEASE_TABLET | ORAL | 1 refills | Status: DC

## 2023-08-15 MED ORDER — POTASSIUM CHLORIDE CRYS ER 20 MEQ PO TBCR
40.0000 meq | EXTENDED_RELEASE_TABLET | ORAL | Status: AC
  Administered 2023-08-15 (×2): 40 meq via ORAL
  Filled 2023-08-15 (×2): qty 2

## 2023-08-15 MED ORDER — IOHEXOL 300 MG/ML  SOLN
75.0000 mL | Freq: Once | INTRAMUSCULAR | Status: AC | PRN
  Administered 2023-08-15: 75 mL via INTRAVENOUS

## 2023-08-15 NOTE — Progress Notes (Signed)
[facility]  [name] mother, [name], called this morning to inform office that patient has been hospitalized. Patient with scheduled imaging at 64 today and mother expressed concern that she didn't want patient to miss appointment. I informed her that patient may be able to have imaging done while inpatient and informed [name] that I will give message to [facility], to let her know that patient has been hospitalized. [name] thanked for the update and encouraged to call with further questions/concerns.   PA-C informed.   Rexene Edison, RN, BSN, [facility], [facility]

## 2023-08-15 NOTE — Progress Notes (Signed)
   08/15/23 1400  Spiritual Encounters  Type of Visit Attempt (pt unavailable)   Chaplain attempted for second time to meet with patient toddy - but not aviable due to dep sleeping.

## 2023-08-15 NOTE — Progress Notes (Signed)
Chaplain received a consult for assistance with advance directives, including mental health AD information. [name] was asleep and chaplain was unable to meet with him.  Please page.  We will attempt again at a later time.
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
## 2023-08-15 NOTE — Discharge Summary (Signed)
Physician Discharge Summary  [name] IHK:[id number] DOB: May 12, 1981 DOA: 08/12/2023  PCP: [name] Lime, MD  Admit date: 08/12/2023 Discharge date: 08/15/2023  Time spent: 40 minutes  Recommendations for Outpatient Follow-up:  Follow outpatient CBC/CMP  Needs follow up of biopsy with GI outpatient  Needs follow up of CT chest outpatient with cancer center Encourage etoh cessation Needs follow up with psych/act team   Discharge Diagnoses:  Active Problems:   Alcohol use disorder, severe, dependence (HCC)   Melena   Alcohol withdrawal (HCC)   Nausea vomiting and diarrhea   Gastrointestinal hemorrhage   Discharge Condition: stable  Diet recommendation: heart healthy  Filed Weights   08/12/23 1351  Weight: 74.8 kg    History of present illness:   43 y.o. adult with medical history significant for alcohol use disorder with alcohol withdrawal seizures, bipolar 1 disorder, CAD, anxiety and depression, suicidal attempt, PTSD, HLD, HTN, schizophrenia,  renal cell carcinoma  s/p partial nephrectomy, lymphadenopathy and chronic left shoulder pain who presented to the ED due to concern for alcohol withdrawal.  He was admitted for alcohol withdrawal.  He also reported few days of melena and found to have positive fecal occult test so GI was consulted for evaluation.   EGD showed gastritis.  Discharging on PPI.  Stable from withdrawal standpoint.  See below and previous notes for additional details  Hospital Course:  Assessment and Plan:  # Alcohol withdrawal # EtOH use disorder - minimal withdrawal symptoms today -Continue CIWA protocol with Ativan -Continue Librium taper -Multivitamin, folate and thiamine -TOC consulted for substance abuse resources, appreciate assistance    # Melena # Abdominal pain - EGD with erosive gastritis, biopsied (follow pathology) - PPI recommended BID x 4 weeks, then daily - avoid NSAIDs and etoh   # HTN -Continue amlodipine   # PTSD #  Bipolar disorder # Anxiety and depression # Schizophrenia Does not report any SI, HI or hallucinations. - patient has ACT team, his mother has concerns regarding his adherence to his psych medications, thinks he needs to go to [facility].  Discussed I don't see clear indication for any inpatient psych care, but given his complexity with act team and concern for inconsistent adherence to meds (and on lamictal) will ask psychiatry to consult -Continue all psych meds including risperidone, trazodone, prazosin, lamotrigine and buspirone -Outpatient follow-up with psychiatry, appreciate their assistance while he was here - doesn't meet IVC criteria, he's declining recommendations for inpatient care -> has an ACT team (called number provider by patient and his mother, unable to reach anyone from act team), will need to follow with them outpatient    # HLD # CAD -Continue atorvastatin   # Peripheral neuropathy EtOH induced -Continue gabapentin   # Renal cell carcinoma s/p partial nephrectomy in 2014 # Lymphadenopathy CT [name]/P last week showed similar prominent lymph nodes in the groin regions bilaterally, partial left nephrectomy without evidence of local recurrence and similar subcutaneous soft tissue nodularity compatible with neoplasm/metastasis. -Continue to follow-up with oncology in the outpatient -> they requested CT chest while he was here, obtained, pending at the time of discharge -> will need to follow with outpatient oncology    Procedures: EGD   Consultations: Psychiatry GI  Discharge Exam: Vitals:   08/15/23 0403 08/15/23 1224  BP: 124/87 113/79  Pulse: 91 78  Resp: 20 16  Temp: 98 F (36.7 C) 98 F (36.7 C)  SpO2: 94% 94%   No new complaints Discussed d/c plan with mother (she's  going to pick up)  General: No acute distress. Cardiovascular: RRR Lungs: unlabored S/nt/nd Neurological: Alert and oriented 3. Moves all extremities 4 with equal strength. Cranial nerves II  through XII grossly intact. Extremities: No clubbing or cyanosis. No edema.  Discharge Instructions   Discharge Instructions     Call MD for:  difficulty breathing, headache or visual disturbances   Complete by: As directed    Call MD for:  extreme fatigue   Complete by: As directed    Call MD for:  hives   Complete by: As directed    Call MD for:  persistant dizziness or light-headedness   Complete by: As directed    Call MD for:  persistant nausea and vomiting   Complete by: As directed    Call MD for:  redness, tenderness, or signs of infection (pain, swelling, redness, odor or green/yellow discharge around incision site)   Complete by: As directed    Call MD for:  severe uncontrolled pain   Complete by: As directed    Call MD for:  temperature >100.4   Complete by: As directed    Diet - low sodium heart healthy   Complete by: As directed    Discharge instructions   Complete by: As directed    You were seen for alcohol withdrawal.  You've improved from [name] withdrawal standpoint.  I'll send you home with [name] couple days of librium to complete [name] short taper.  It's important that you not drink alcohol while taking librium (this can cause worsening sedation or respiratory depression).  I encourage continued abstinence.  Programs like alcoholics anonymous and [name] counselor or therapist can be helpful with abstinence.  Please follow up with your outpatient providers to follow up with this.  You also had [name] gastrointestinal bleed.  Your endoscopy showed gastritis (inflammation of the stomach).  You should continue protonix twice [name] day for 4 weeks and then protonix 40 mg daily.  Avoid NSAIDs and alcohol.  Follow up with gastroenterology as an outpatient.  We got your CT scan of your chest here.  You'll need to follow up with your Cancer Center doctors to follow up the pending results of your CT chest.   Follow up with your ACT team outpatient.  Return for new, recurrent, or worsening  symptoms.  Please ask your PCP to request records from this hospitalization so they know what was done and what the next steps will be.   Increase activity slowly   Complete by: As directed       Allergies as of 08/15/2023       Reactions   Codeine Hives, Itching, Swelling, Other (See Comments)   Does not impair breathing, however   Penicillins Other (See Comments)   Unknown childhood allergy   Morphine Itching, Swelling   Coconut (cocos Nucifera) Other (See Comments)   Cannot take with some of the meds    Grapefruit Concentrate Other (See Comments)   Cannot take with some of the meds        Medication List     STOP taking these medications    divalproex 500 MG DR tablet Commonly known as: DEPAKOTE       TAKE these medications    amLODipine 5 MG tablet Commonly known as: NORVASC Take 1 tablet (5 mg total) by mouth daily. What changed: when to take this   atorvastatin 40 MG tablet Commonly known as: LIPITOR Take 1 tablet (40 mg total) by mouth daily. What changed: when  to take this   busPIRone 15 MG tablet Commonly known as: BUSPAR Take 15 mg by mouth See admin instructions. Take 15 mg by mouth in the morning, evening, and at bedtime   chlordiazePOXIDE 25 MG capsule Commonly known as: LIBRIUM Take 1 capsule (25 mg total) by mouth 2 (two) times daily for 2 days, THEN 1 capsule (25 mg total) at bedtime for 1 day. Start taking on: August 15, 2023   gabapentin 600 MG tablet Commonly known as: NEURONTIN Take 600 mg by mouth in the morning, at noon, and at bedtime.   hydrOXYzine 50 MG capsule Commonly known as: VISTARIL Take 50 mg by mouth 3 (three) times daily as needed for anxiety.   lamoTRIgine 100 MG tablet Commonly known as: LAMICTAL Take 50 mg by mouth in the morning and at bedtime.   pantoprazole 40 MG tablet Commonly known as: Protonix Take 1 tablet (40 mg total) by mouth 2 (two) times daily for 30 days, THEN 1 tablet (40 mg total) daily. Follow  up with gastroenterology as an outpatient. Start taking on: August 15, 2023   prazosin 1 MG capsule Commonly known as: MINIPRESS Take 1 mg by mouth at bedtime.   PreserVision AREDS 2 Caps Take 1 capsule by mouth in the morning and at bedtime.   risperiDONE 1 MG tablet Commonly known as: RISPERDAL Take 1 mg by mouth at bedtime.   traZODone 150 MG tablet Commonly known as: DESYREL Take 300 mg by mouth at bedtime.       Allergies  Allergen Reactions   Codeine Hives, Itching, Swelling and Other (See Comments)    Does not impair breathing, however   Penicillins Other (See Comments)    Unknown childhood allergy   Morphine Itching and Swelling   Coconut (Cocos Nucifera) Other (See Comments)    Cannot take with some of the meds    Grapefruit Concentrate Other (See Comments)    Cannot take with some of the meds      The results of significant diagnostics from this hospitalization (including imaging, microbiology, ancillary and laboratory) are listed below for reference.    Significant Diagnostic Studies: CT ABDOMEN PELVIS W CONTRAST Result Date: 08/07/2023 CLINICAL DATA:  History of breast cancer and renal cancer, follow-up lymphadenopathy * Tracking Code: BO * EXAM: CT ABDOMEN AND PELVIS WITH CONTRAST TECHNIQUE: Multidetector CT imaging of the abdomen and pelvis was performed using the standard protocol following bolus administration of intravenous contrast. RADIATION DOSE REDUCTION: This exam was performed according to the departmental dose-optimization program which includes automated exposure control, adjustment of the mA and/or kV according to patient size and/or use of iterative reconstruction technique. CONTRAST:  OMNIPAQUE IOHEXOL 300 MG/ML  SOLN COMPARISON:  Multiple priors including PET-CT April 18, 2023 and CT March 18, 2023 FINDINGS: Lower chest: Multiple subcutaneous nodules again seen in the anterior chest wall with Geofrey Silliman dominant nodule in the right anterior wall  measuring 9.6 x 3.9 cm on image 4/2 previously 8.6 x 4.8 cm. New 9 mm ground-glass opacity in the right lower lobe on image 11/4 Hepatobiliary: No suspicious hepatic lesion. Gallbladder is unremarkable. No biliary ductal dilation. Pancreas: Pancreatic ductal dilation or evidence of acute inflammation. Spleen: No splenomegaly. Adrenals/Urinary Tract: Bilateral adrenal glands appear normal. No hydronephrosis. Kidneys demonstrate symmetric enhancement. Prior partial left nephrectomy without evidence of local recurrence. Urinary bladder is unremarkable for degree of distension. Stomach/Bowel: No radiopaque enteric contrast material was administered. Stomach is unremarkable for degree of distension. No pathologic dilation of  small or large bowel. Normal appendix. No evidence of acute bowel inflammation. Vascular/Lymphatic: Normal caliber abdominal aorta. Scattered aortic atherosclerosis. Smooth IVC contours. The portal, splenic and superior mesenteric veins are patent. No gastrohepatic or hepatoduodenal ligament lymphadenopathy. No retroperitoneal or mesenteric lymphadenopathy. No pelvic sidewall lymphadenopathy. Similar prominent lymph nodes in the groin regions bilaterally for instance Florine Sprenkle right inguinal lymph node measuring 10 mm in short axis on image 96/2 previously measured 11 mm. Reproductive: Prostate is unremarkable. Other: Similar soft tissue nodularity throughout the subcutaneous tissues of the abdomen pelvis with Ann Groeneveld predominance again located in the bilateral flanks of the pelvis. Musculoskeletal: No aggressive lytic or blastic lesion of bone. IMPRESSION: 1. Similar prominent lymph nodes in the groin regions bilaterally. Suggest continued attention on follow-up imaging. 2. Prior partial left nephrectomy without evidence of local recurrence. 3. Similar subcutaneous soft tissue nodularity compatible with neoplasm/metastasis. 4. New 9 mm ground-glass opacity in the right lower lobe, nonspecific and possibly  infectious/inflammatory. Suggest attention on follow-up imaging. 5.  Aortic Atherosclerosis (ICD10-I70.0). Electronically Signed   By: Maudry Mayhew M.D.   On: 08/07/2023 09:58    Microbiology: No results found for this or any previous visit (from the past 240 hours).   Labs: Basic Metabolic Panel: Recent Labs  Lab 08/12/23 1417 08/13/23 0546 08/15/23 0458  NA 135 137 136  K 3.9 3.7 3.4*  CL 96* 100 101  CO2 21* 28 27  GLUCOSE 117* 90 95  BUN 12 9 <5*  CREATININE 0.76 0.86 0.77  CALCIUM 8.7* 8.4* 8.8*  MG  --  1.9 1.7  PHOS  --  2.7 3.2   Liver Function Tests: Recent Labs  Lab 08/12/23 1417 08/13/23 0546  AST 69* 49*  ALT 17 28  ALKPHOS 77 61  BILITOT 0.8 1.4*  PROT 8.5* 7.0  ALBUMIN 4.0 3.3*   Recent Labs  Lab 08/12/23 1417  LIPASE 21   No results for input(s): "AMMONIA" in the last 168 hours. CBC: Recent Labs  Lab 08/12/23 1417 08/13/23 0546 08/15/23 0458  WBC 5.2 3.7* 4.9  HGB 14.6 12.6* 12.5*  HCT 42.4 37.6* 38.2*  MCV 85.0 86.6 88.6  PLT 173 148* 144*   Cardiac Enzymes: No results for input(s): "CKTOTAL", "CKMB", "CKMBINDEX", "TROPONINI" in the last 168 hours. BNP: BNP (last 3 results) No results for input(s): "BNP" in the last 8760 hours.  ProBNP (last 3 results) No results for input(s): "PROBNP" in the last 8760 hours.  CBG: No results for input(s): "GLUCAP" in the last 168 hours.     Signed:  Lacretia Nicks MD.  [facility] 08/15/2023, 5:14 PM

## 2023-08-16 ENCOUNTER — Encounter (HOSPITAL_COMMUNITY): Payer: Self-pay | Admitting: Gastroenterology

## 2023-08-16 ENCOUNTER — Other Ambulatory Visit: Payer: Self-pay | Admitting: Physician Assistant

## 2023-08-16 ENCOUNTER — Telehealth: Payer: Self-pay

## 2023-08-16 DIAGNOSIS — R591 Generalized enlarged lymph nodes: Secondary | ICD-10-CM

## 2023-08-16 NOTE — Progress Notes (Signed)
I notified [name]'s mother by phone regarding CT chest, abdomen and pelvis results. Patient does not currently have a phone and requested results be delivered to their mother. Compared to prior scans in August/September there is no significant change in lymphadenopathy. Results were discussed with Dr. Leonides Schanz who agrees with repeat imaging in 3 months for monitoring. Will schedule patient for telephone follow up with my colleague Karena Addison, [place] to discuss results. Mother had no additional questions and will inform patient of these results.

## 2023-08-16 NOTE — Transitions of Care (Post Inpatient/ED Visit) (Signed)
08/16/2023  Name: [name] MRN: [id number] DOB: [birth year]  Today's TOC FU Call Status: Today's TOC FU Call Status:: Successful TOC FU Call Completed TOC FU Call Complete Date: 08/16/23 [name] Name and Date of Birth confirmed.  Transition Care Management Follow-up Telephone Call Date of Discharge: 08/15/23 Discharge Facility: [facility]) Type of Discharge: Inpatient Admission Primary Inpatient Discharge Diagnosis:: nausea vomiting How have you been since you were released from the hospital?: Better Any questions or concerns?: No  Items Reviewed: Did you receive and understand the discharge instructions provided?: Yes Medications obtained,verified, and reconciled?: Yes (Medications Reviewed) Any new allergies since your discharge?: No Dietary orders reviewed?: Yes Do you have support at home?: No  Medications Reviewed Today: Medications Reviewed Today     Reviewed by Karena Addison, LPN (Licensed Practical Nurse) on 08/16/23 at [phone]  Med List Status: <None>   Medication Order Taking? Sig Documenting Provider Last Dose Status Informant  amLODipine (NORVASC) 5 MG tablet [id number] No Take 1 tablet (5 mg total) by mouth daily.  Patient taking differently: Take 5 mg by mouth in the morning.   Kathleen Lime, MD 08/12/2023 Morning Active Mother  atorvastatin (LIPITOR) 40 MG tablet [id number] No Take 1 tablet (40 mg total) by mouth daily.  Patient taking differently: Take 40 mg by mouth at bedtime.   Kathleen Lime, MD 08/11/2023 Bedtime Active Mother  busPIRone (BUSPAR) 15 MG tablet [id number] No Take 15 mg by mouth See admin instructions. Take 15 mg by mouth in the morning, evening, and at bedtime [provider] 08/12/2023 Morning Active Mother  chlordiazePOXIDE (LIBRIUM) 25 MG capsule [id number]  Take 1 capsule (25 mg total) by mouth 2 (two) times daily for 2 days, THEN 1 capsule (25 mg total) at bedtime for 1 day. Zigmund Daniel., MD  Active   gabapentin  (NEURONTIN) 600 MG tablet [id number] No Take 600 mg by mouth in the morning, at noon, and at bedtime. [provider] 08/12/2023 Morning Active Mother  hydrOXYzine (VISTARIL) 50 MG capsule [id number]  Take 50 mg by mouth 3 (three) times daily as needed for anxiety. [provider]  Active Mother  lamoTRIgine (LAMICTAL) 100 MG tablet [id number] No Take 50 mg by mouth in the morning and at bedtime. [provider] 08/12/2023 Morning Active Mother  Multiple Vitamins-Minerals (PRESERVISION AREDS 2) CAPS [id number] No Take 1 capsule by mouth in the morning and at bedtime. [provider] 08/12/2023 Morning Active Mother  pantoprazole (PROTONIX) 40 MG tablet [id number]  Take 1 tablet (40 mg total) by mouth 2 (two) times daily for 30 days, THEN 1 tablet (40 mg total) daily. Follow up with gastroenterology as an outpatient. Zigmund Daniel., MD  Active   prazosin (MINIPRESS) 1 MG capsule [id number] No Take 1 mg by mouth at bedtime. [provider] 08/11/2023 Active Mother  risperiDONE (RISPERDAL) 1 MG tablet [id number] No Take 1 mg by mouth at bedtime. [provider] 08/11/2023 Bedtime Active Mother  traZODone (DESYREL) 150 MG tablet [id number] No Take 300 mg by mouth at bedtime. [provider] 08/11/2023 Bedtime Active Mother            Home Care and Equipment/Supplies: Were Home Health Services Ordered?: NA Any new equipment or medical supplies ordered?: NA  Functional Questionnaire: Do you need assistance with bathing/showering or dressing?: No Do you need assistance with meal preparation?: No Do you need assistance with eating?: No Do you have difficulty maintaining continence: No Do you need assistance with  getting out of bed/getting out of a chair/moving?: No Do you have difficulty managing or taking your medications?: No  Follow up appointments reviewed: PCP Follow-up appointment confirmed?: Yes Date of PCP follow-up appointment?:  08/18/23 Follow-up Provider: [facility] Follow-up appointment confirmed?: Yes Date of Specialist follow-up appointment?: 08/19/23 Follow-Up Specialty Provider:: onco Do you need transportation to your follow-up appointment?: No Do you understand care options if your condition(s) worsen?: Yes-patient verbalized understanding    SIGNATURE Karena Addison, LPN [facility] Nurse Health Advisor Direct Dial [phone]

## 2023-08-17 LAB — SURGICAL PATHOLOGY

## 2023-08-18 ENCOUNTER — Telehealth: Payer: Self-pay | Admitting: Physician Assistant

## 2023-08-18 ENCOUNTER — Ambulatory Visit: Payer: MEDICAID | Admitting: Student

## 2023-08-18 VITALS — BP 117/80 | HR 96 | Temp 97.5°F | Ht 66.0 in | Wt 174.3 lb

## 2023-08-18 DIAGNOSIS — F1010 Alcohol abuse, uncomplicated: Secondary | ICD-10-CM

## 2023-08-18 DIAGNOSIS — H9193 Unspecified hearing loss, bilateral: Secondary | ICD-10-CM | POA: Diagnosis not present

## 2023-08-18 DIAGNOSIS — I700 Atherosclerosis of aorta: Secondary | ICD-10-CM | POA: Diagnosis not present

## 2023-08-18 DIAGNOSIS — R591 Generalized enlarged lymph nodes: Secondary | ICD-10-CM

## 2023-08-18 DIAGNOSIS — Z8719 Personal history of other diseases of the digestive system: Secondary | ICD-10-CM | POA: Insufficient documentation

## 2023-08-18 DIAGNOSIS — Z23 Encounter for immunization: Secondary | ICD-10-CM | POA: Diagnosis not present

## 2023-08-18 MED ORDER — PANTOPRAZOLE SODIUM 40 MG PO TBEC: DELAYED_RELEASE_TABLET | ORAL | 1 refills | Status: DC

## 2023-08-18 NOTE — Assessment & Plan Note (Signed)
>>  ASSESSMENT AND PLAN FOR ALCOHOL  ABUSE WRITTEN ON 08/18/2023  9:20 PM BY Sheree Dieter, MD  Denies alcohol  use since leaving hospital at most recent time of discharge.  They are doing well from a mental health standpoint are taking their medications.  Patient states that psychiatry comes to their house to follow with them, and will see them tomorrow.  PHQ-9 and GAD-7 are both 0 today.

## 2023-08-18 NOTE — Assessment & Plan Note (Signed)
CT scan during last admission redemonstrating lymphadenopathy.  In combination with known history of renal cell carcinoma, there was some concern for metastatic disease.  Per oncology, lymphadenopathy appears to be stable.  Plan is to repeat imaging in 3 months.  This was discussed with the patient, they will follow with oncology who will set up repeat CT scan.
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
## 2023-08-18 NOTE — Telephone Encounter (Signed)
Scheduled patient for appointment. Rose conferenced [place] to the call for Jefry to confirm appointment. Stacy is aware of all appointment details.

## 2023-08-18 NOTE — Assessment & Plan Note (Addendum)
Denies alcohol use since leaving hospital at most recent time of discharge.  They are doing well from a mental health standpoint are taking their medications.  Patient states that psychiatry comes to their house to follow with them, and will see them tomorrow.  PHQ-9 and GAD-7 are both 0 today.

## 2023-08-18 NOTE — Patient Instructions (Addendum)
Thank you, [name] for allowing [place] to provide your care today.   Referrals ordered today:   Referral Orders         AMB Referral [facility]BCI Care Management       I have ordered the following medication/changed the following medications:   Start the following medications: Meds ordered this encounter  Medications   pantoprazole (PROTONIX) 40 MG tablet    Sig: Take 1 tablet (40 mg total) by mouth 2 (two) times daily for 30 days, THEN 1 tablet (40 mg total) daily. Follow up with gastroenterology as an outpatient.    Dispense:  30 tablet    Refill:  1     Follow up: 3 months for routine follow up  Call GI:  [facility] [address] [place], [place] [address] Call us [phone]   We look forward to seeing you next time. Please call our clinic at [phone] if you have any questions or concerns. The best time to call is Monday-Friday from 9am-4pm, but there is someone available 24/7. If after hours or the weekend, call the main hospital number and ask for the Internal Medicine Resident On-Call. If you need medication refills, please notify your pharmacy one week in advance and they will send [place] a request.   Thank you for trusting me with your care. Wishing you the best!  Lovie Macadamia MD [facility]

## 2023-08-18 NOTE — Assessment & Plan Note (Signed)
Referred to audiology following last visit.  Was recommended to get hearing aids, however some financial trouble and obtaining them, and limited availability to get them through her insurance. Plan: Social work referral for medication assistance

## 2023-08-18 NOTE — Assessment & Plan Note (Addendum)
Melena on last admission, biopsies from EGD were negative for dysplasia negative for H. pylori.  He was recommended to be on PPI twice daily for 4 weeks then daily for a year.  Patient has not received his Protonix yet.  Denies any ongoing melena, hematochezia, hematemesis, lightheadedness, or dizziness.  No abdominal pain. Plan: New prescription sent in for Protonix Patient given GI phone number to follow-up

## 2023-08-19 ENCOUNTER — Telehealth: Payer: MEDICAID | Admitting: Physician Assistant

## 2023-08-22 NOTE — Progress Notes (Signed)
 Internal Medicine Clinic Attending  Case discussed with the resident at the time of the visit.  We reviewed the resident's history and exam and pertinent patient test results.  I agree with the assessment, diagnosis, and plan of care documented in the resident's note.

## 2023-09-02 ENCOUNTER — Other Ambulatory Visit: Payer: Self-pay

## 2023-09-02 NOTE — Patient Instructions (Signed)
Tailored Plan Medicaid On July 1, some people on [place] Medicaid will move to a new kind of Medicaid health plan called a Tailored Plan. Tailored Plans cover your doctor visits, prescription drugs, and health care services.    If your [place] Medicaid will move to a Tailored Plan, you should have gotten a letter and welcome packet. If you're not sure, call your [place] Medicaid Enrollment Broker at [phone] and ask.  Check out these free materials, in [place] and [place], to learn more about your Tailored Plan: [email or web address]-Plans/Toolkit  Tailored Care Management Services  TCM services are available to you now. If you are a Tailored Plan member or will be and want information about Tailored Care Management Services including rides to appointments and community and home services, call the Care Management provider for your county of residence:    [facility] ([place], [place])  Member Services: [phone] Behavioral Health Crisis Line: [phone], [place], [place], [place], [place])  Member Services: [phone] Behavioral Health Crisis Line: [phone]  [facility]) Member Services: [phone] Behavioral Health Crisis Line: [phone]

## 2023-09-02 NOTE — Patient Outreach (Signed)
Medicaid Managed Care Social Work Note  09/02/2023 Name:  [name] MRN:  [id number] DOB:  [birth year]  [name] is an 43 y.o. year old adult who is a primary patient of Kathleen Lime, MD.  The [facility] Managed Care Coordination team was consulted for assistance with:   hearing aids  Mr. Dimperio was given information about Medicaid Managed Care Coordination team services today. [name] Patient agreed to services and verbal consent obtained.  Engaged with patient  for by telephone forinitial visit in response to referral for case management and/or care coordination services.   Patient is participating in a Managed Medicaid Plan:  Yes  Assessments/Interventions:  Review of past medical history, allergies, medications, health status, including review of consultants reports, laboratory and other test data, was performed as part of comprehensive evaluation and provision of chronic care management services.  SDOH: (Social Drivers of Health) assessments and interventions performed: SDOH Interventions    Flowsheet Row Office Visit from 05/19/2023 in [facility] - A Dept Of [place]. [facility] Office Visit from 01/18/2023 in [facility] Internal Med C[facility] - A Dept Of [place]. [facility] Office Visit from 11/29/2022 in [facility] Internal Med Ctr - A Dept Of [place]. [facility] ED from 07[facility]23/2021 in [facility] Department at [facility] Office Visit from 03/19/2021 in [facility] Internal Med Ctr - A Dept Of . [facility]  SDOH Interventions       Depression Interventions/Treatment  Medication, Currently on Treatment Currently on Treatment, Medication Counseling Referral to Psychiatry Referral to Psychiatry  [Patient following with BH]      BSW completed a telephone outreach with patient, patient states she is in need of resources for hearing aids. She has located one place that will pay for only one hearing aid  but states 2 aids are needed. They are also requesting $200 with insurance. BSW and patient agreed for hearing aid resources to be mailed. No other  Advanced Directives Status:  Not addressed in this encounter.  Care Plan                 Allergies  Allergen Reactions   Codeine Hives, Itching, Swelling and Other (See Comments)    Does not impair breathing, however   Penicillins Other (See Comments)    Unknown childhood allergy   Morphine Itching and Swelling   Coconut (Cocos Nucifera) Other (See Comments)    Cannot take with some of the meds    Grapefruit Concentrate Other (See Comments)    Cannot take with some of the meds    Medications Reviewed Today   Medications were not reviewed in this encounter     Patient Active Problem List   Diagnosis Date Noted   Aortic atherosclerosis (HCC) 08/18/2023   History of melena 08/18/2023   Alcohol withdrawal (HCC) 08/12/2023   Nausea vomiting and diarrhea 08/12/2023   Gastrointestinal hemorrhage 08/12/2023   Lymphadenopathy 05/20/2023   Hearing loss 05/20/2023   Painful orthopaedic hardware (HCC) 04/25/2023   Encounter for screening involving social determinants of health (SDoH) 03/30/2023   Flu vaccine need 03/30/2023   Neuropathic pain of left hand 03/16/2023   Erythema nodosum 02/09/2023   Itching 01/18/2023   MDD (major depressive disorder) 01/01/2023   Liver cirrhosis (HCC) 06/26/2022   QT prolongation 06/26/2022   Coronary artery disease 09/17/2021   MDD (major depressive disorder), recurrent episode, severe (HCC) 09/13/2021   Suicide attempt (HCC)  02/20/2021   Hand sanitizer poisoning 02/20/2021   Postconcussion syndrome 11/04/2020   Dysesthesia 09/15/2020   Thoracic outlet syndrome 06/05/2020   GAD (generalized anxiety disorder) 05/22/2020   Chronic pain 03/26/2020   Anxiety 03/06/2019   Polysubstance dependence including opioid type drug, continuous use (HCC) 08/02/2018   Bipolar affective disorder, mixed, severe,  with psychotic behavior (HCC) 07/29/2018   Chronic left shoulder pain 01/11/2018   Alcohol withdrawal seizure without complication (HCC)    Melena 12/12/2017   Insomnia    Hypertension    Depression, major, severe recurrence (HCC) 12/30/2015   Mood disorder in conditions classified elsewhere    Alcohol use disorder 10/26/2015   Personality disorder (HCC) 10/26/2015   Malnutrition of moderate degree 09/24/2015   Tobacco use disorder 07/16/2015   Overdose 04/10/2015   Suicidal ideation    Tylenol overdose    Polysubstance abuse (HCC)    Overdose of acetaminophen 08/03/2014   Cocaine dependence syndrome (HCC) 04/16/2014   Thrombocytopenia (HCC) 04/15/2014   Transaminitis 09/24/2013   S/p nephrectomy 04/28/2013   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   Follow up:  Patient agrees to Care Plan and Follow-up.  Plan: The Managed Medicaid care management team will reach out to the patient again over the next 30 days.  Date/time of next scheduled Social Work care management/care coordination outreach:  09/30/23 Gus Puma, Kenard Gower, [facility]  Managed [facility] Social Worker [phone]

## 2023-09-30 ENCOUNTER — Other Ambulatory Visit: Payer: Self-pay

## 2023-09-30 NOTE — Patient Instructions (Signed)
 Tailored Plan Medicaid On July 1, some people on [place] Medicaid will move to a new kind of Medicaid health plan called a Tailored Plan. Tailored Plans cover your doctor visits, prescription drugs, and health care services.    If your [place] Medicaid will move to a Tailored Plan, you should have gotten a letter and welcome packet. If you're not sure, call your [place] Medicaid Enrollment Broker at [phone] and ask.  Check out these free materials, in [place] and [place], to learn more about your Tailored Plan: [email or web address]-Plans/Toolkit  Tailored Care Management Services  TCM services are available to you now. If you are a Tailored Plan member or will be and want information about Tailored Care Management Services including rides to appointments and community and home services, call the Care Management provider for your county of residence:    [facility] ([place], [place])  Member Services: [phone] Behavioral Health Crisis Line: [phone], [place], [place], [place], [place])  Member Services: [phone] Behavioral Health Crisis Line: [phone]  [facility]) Member Services: [phone] Behavioral Health Crisis Line: [phone]

## 2023-09-30 NOTE — Patient Outreach (Signed)
 Medicaid Managed Care Social Work Note  09/30/2023 Name:  [name] MRN:  [id number] DOB:  [birth year]  [name] is an 43 y.o. year old adult who is a primary patient of Kathleen Lime, MD.  The [facility] Managed Care Coordination team was consulted for assistance with:   hearing aids  [name] was given information about Medicaid Managed Care Coordination team services today. [name] Patient agreed to services and verbal consent obtained.  Engaged with patient  for by telephone forfollow up visit in response to referral for case management and/or care coordination services.   Patient is participating in a Managed Medicaid Plan:  Yes  Assessments/Interventions:  Review of past medical history, allergies, medications, health status, including review of consultants reports, laboratory and other test data, was performed as part of comprehensive evaluation and provision of chronic care management services.  SDOH: (Social Drivers of Health) assessments and interventions performed: SDOH Interventions    Flowsheet Row Office Visit from 05/19/2023 in [facility] Internal Med C[facility] - A Dept Of [place]. [facility] Office Visit from 01/18/2023 in [facility] Internal Med Ctr - A Dept Of [place]. [facility] Office Visit from 11/29/2022 in [facility] Internal Med Ctr - A Dept Of [place]. [facility] ED from 07/23/2021 in [facility] Emergency Department at [facility] Office Visit from 03/19/2021 in [facility] Internal Med Ctr - A Dept Of [place]. [facility]  SDOH Interventions       Depression Interventions/Treatment  Medication, Currently on Treatment Currently on Treatment, Medication Counseling Referral to Psychiatry Referral to Psychiatry  [Patient following with BH]     BSW completed a telephone outreach with patient, she states she did not receive the resources BSW sent. BSW and patient agreed for resources to be resent via email on file.  Email sent. No other resources are needed at this time. BSW provided contact information for any future needs.   Advanced Directives Status:  Not addressed in this encounter.  Care Plan                 Allergies  Allergen Reactions   Codeine Hives, Itching, Swelling and Other (See Comments)    Does not impair breathing, however   Penicillins Other (See Comments)    Unknown childhood allergy   Morphine Itching and Swelling   Coconut (Cocos Nucifera) Other (See Comments)    Cannot take with some of the meds    Grapefruit Concentrate Other (See Comments)    Cannot take with some of the meds    Medications Reviewed Today   Medications were not reviewed in this encounter     Patient Active Problem List   Diagnosis Date Noted   Aortic atherosclerosis (HCC) 08/18/2023   History of melena 08/18/2023   Alcohol withdrawal (HCC) 08/12/2023   Nausea vomiting and diarrhea 08/12/2023   Gastrointestinal hemorrhage 08/12/2023   Lymphadenopathy 05/20/2023   Hearing loss 05/20/2023   Painful orthopaedic hardware (HCC) 04/25/2023   Encounter for screening involving social determinants of health (SDoH) 03/30/2023   Flu vaccine need 03/30/2023   Neuropathic pain of left hand 03/16/2023   Erythema nodosum 02/09/2023   Itching 01/18/2023   MDD (major depressive disorder) 01/01/2023   Liver cirrhosis (HCC) 06/26/2022   QT prolongation 06/26/2022   Coronary artery disease 09/17/2021   MDD (major depressive disorder), recurrent episode, severe (HCC) 09/13/2021   Suicide attempt (HCC) 02/20/2021   Hand sanitizer poisoning 02/20/2021  Postconcussion syndrome 11/04/2020   Dysesthesia 09/15/2020   Thoracic outlet syndrome 06/05/2020   GAD (generalized anxiety disorder) 05/22/2020   Chronic pain 03/26/2020   Anxiety 03/06/2019   Polysubstance dependence including opioid type drug, continuous use (HCC) 08/02/2018   Bipolar affective disorder, mixed, severe, with psychotic behavior (HCC)  07/29/2018   Chronic left shoulder pain 01/11/2018   Alcohol withdrawal seizure without complication (HCC)    Melena 12/12/2017   Insomnia    Hypertension    Depression, major, severe recurrence (HCC) 12/30/2015   Mood disorder in conditions classified elsewhere    Alcohol use disorder 10/26/2015   Personality disorder (HCC) 10/26/2015   Malnutrition of moderate degree 09/24/2015   Tobacco use disorder 07/16/2015   Overdose 04/10/2015   Suicidal ideation    Tylenol overdose    Polysubstance abuse (HCC)    Overdose of acetaminophen 08/03/2014   Cocaine dependence syndrome (HCC) 04/16/2014   Thrombocytopenia (HCC) 04/15/2014   Transaminitis 09/24/2013   S/p nephrectomy 04/28/2013   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   Follow up:  Patient agrees to Care Plan and Follow-up.  Plan: The  Patient has been provided with contact information for the Managed Medicaid care management team and has been advised to call with any health related questions or concerns.    Abelino Derrick, MHA [facility]  Managed [facility] Social Worker [phone]

## 2023-11-14 ENCOUNTER — Ambulatory Visit (INDEPENDENT_AMBULATORY_CARE_PROVIDER_SITE_OTHER): Payer: MEDICAID | Admitting: Student

## 2023-11-14 ENCOUNTER — Encounter (HOSPITAL_COMMUNITY): Payer: Self-pay

## 2023-11-14 ENCOUNTER — Ambulatory Visit (HOSPITAL_COMMUNITY)
Admission: RE | Admit: 2023-11-14 | Discharge: 2023-11-14 | Disposition: A | Discharge status: Home / Self Care | Payer: MEDICAID | Source: Ambulatory Visit | Attending: Physician Assistant | Admitting: Physician Assistant

## 2023-11-14 VITALS — BP 125/86 | HR 108 | Temp 98.0°F | Ht 66.0 in | Wt 176.6 lb

## 2023-11-14 DIAGNOSIS — I10 Essential (primary) hypertension: Secondary | ICD-10-CM | POA: Diagnosis not present

## 2023-11-14 DIAGNOSIS — Z905 Acquired absence of kidney: Secondary | ICD-10-CM | POA: Insufficient documentation

## 2023-11-14 DIAGNOSIS — Z8719 Personal history of other diseases of the digestive system: Secondary | ICD-10-CM | POA: Diagnosis not present

## 2023-11-14 DIAGNOSIS — C642 Malignant neoplasm of left kidney, except renal pelvis: Secondary | ICD-10-CM | POA: Diagnosis not present

## 2023-11-14 DIAGNOSIS — R591 Generalized enlarged lymph nodes: Secondary | ICD-10-CM | POA: Diagnosis present

## 2023-11-14 DIAGNOSIS — F3164 Bipolar disorder, current episode mixed, severe, with psychotic features: Secondary | ICD-10-CM | POA: Diagnosis not present

## 2023-11-14 LAB — POCT I-STAT CREATININE: Creatinine, Ser: 1 mg/dL (ref 0.61–1.24)

## 2023-11-14 MED ORDER — IOHEXOL 300 MG/ML  SOLN
100.0000 mL | Freq: Once | INTRAMUSCULAR | Status: AC | PRN
  Administered 2023-11-14: 100 mL via INTRAVENOUS

## 2023-11-14 MED ORDER — SODIUM CHLORIDE (PF) 0.9 % IJ SOLN
INTRAMUSCULAR | Status: AC
  Filled 2023-11-14: qty 50

## 2023-11-14 NOTE — Patient Instructions (Signed)
 Thank you, Mr.Burgess Lieb for allowing us  to provide your care today. Today we discussed   -Referral sent for GI doctor. Please continue taking Protonix  daily.  -You have appt with your cancer doctor on 11/23/23 at 1PM -Blood pressure looks good today, continue amlodipine   Follow up:  3-4 months     Should you have any questions or concerns please call the internal medicine clinic at [phone].    Tramon Crescenzo, D.O. [facility] Center
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
## 2023-11-15 ENCOUNTER — Encounter: Payer: Self-pay | Admitting: Student

## 2023-11-15 MED ORDER — PANTOPRAZOLE SODIUM 40 MG PO TBEC: 40.0000 mg | DELAYED_RELEASE_TABLET | Freq: Every day | ORAL | 2 refills | Status: DC

## 2023-11-15 NOTE — Assessment & Plan Note (Signed)
 Hx of left renal cell carcinoma s/p partial nephrectomy in 2014. Following with oncology with next appt on 5/7. Had CT chest/abd/pelvis done today for monitoring for lymphadenopathy seen on prior imaging.

## 2023-11-15 NOTE — Assessment & Plan Note (Signed)
 Has not followed up with GI since hospitalization in January for melena. EGD then with erosive gastritis but negative dysplasia and H pylori. Reports taking Protonix  40 mg daily. Denies any melena or hematochezia at this time.   Plan -Continue PPI -New referral sent to GI today (seen by [facility] GI in hospital)

## 2023-11-15 NOTE — Assessment & Plan Note (Signed)
 Stable. BP controlled at 125/86. Taking amlodipine  5 mg daily.   Plan -Continue amlodipine 

## 2023-11-15 NOTE — Assessment & Plan Note (Signed)
 Follows with [facility]. Has appt later this week for f/u.

## 2023-11-17 ENCOUNTER — Telehealth: Payer: Self-pay | Admitting: Physician Assistant

## 2023-11-18 NOTE — Addendum Note (Signed)
 Addended by: Bevelyn Bryant on: 11/18/2023 10:23 AM   Modules accepted: Level of Service

## 2023-11-18 NOTE — Progress Notes (Signed)
 Internal Medicine Clinic Attending  Case discussed with the resident at the time of the visit.  We reviewed the resident's history and exam and pertinent patient test results.  I agree with the assessment, diagnosis, and plan of care documented in the resident's note.

## 2023-11-23 ENCOUNTER — Telehealth: Payer: MEDICAID | Admitting: Physician Assistant

## 2023-11-23 ENCOUNTER — Other Ambulatory Visit: Payer: Self-pay | Admitting: Physician Assistant

## 2023-11-24 ENCOUNTER — Inpatient Hospital Stay: Payer: MEDICAID | Attending: Physician Assistant | Admitting: Physician Assistant

## 2023-11-24 ENCOUNTER — Other Ambulatory Visit: Payer: Self-pay

## 2023-11-24 ENCOUNTER — Emergency Department (HOSPITAL_COMMUNITY)
Admission: EM | Admit: 2023-11-24 | Discharge: 2023-11-24 | Disposition: A | Discharge status: Home / Self Care | Payer: MEDICAID | Attending: Emergency Medicine | Admitting: Emergency Medicine

## 2023-11-24 ENCOUNTER — Encounter (HOSPITAL_COMMUNITY): Payer: Self-pay

## 2023-11-24 DIAGNOSIS — F1090 Alcohol use, unspecified, uncomplicated: Secondary | ICD-10-CM

## 2023-11-24 DIAGNOSIS — Y908 Blood alcohol level of 240 mg/100 ml or more: Secondary | ICD-10-CM | POA: Insufficient documentation

## 2023-11-24 DIAGNOSIS — R591 Generalized enlarged lymph nodes: Secondary | ICD-10-CM

## 2023-11-24 DIAGNOSIS — F10120 Alcohol abuse with intoxication, uncomplicated: Secondary | ICD-10-CM | POA: Diagnosis present

## 2023-11-24 DIAGNOSIS — R918 Other nonspecific abnormal finding of lung field: Secondary | ICD-10-CM

## 2023-11-24 DIAGNOSIS — D696 Thrombocytopenia, unspecified: Secondary | ICD-10-CM | POA: Diagnosis not present

## 2023-11-24 DIAGNOSIS — E876 Hypokalemia: Secondary | ICD-10-CM | POA: Diagnosis not present

## 2023-11-24 DIAGNOSIS — F10920 Alcohol use, unspecified with intoxication, uncomplicated: Secondary | ICD-10-CM

## 2023-11-24 LAB — COMPREHENSIVE METABOLIC PANEL WITH GFR
ALT: 33 U/L (ref 0–44)
AST: 103 U/L — ABNORMAL HIGH (ref 15–41)
Albumin: 3.3 g/dL — ABNORMAL LOW (ref 3.5–5.0)
Alkaline Phosphatase: 37 U/L — ABNORMAL LOW (ref 38–126)
Anion gap: 13 (ref 5–15)
BUN: 6 mg/dL (ref 6–20)
CO2: 22 mmol/L (ref 22–32)
Calcium: 8.2 mg/dL — ABNORMAL LOW (ref 8.9–10.3)
Chloride: 104 mmol/L (ref 98–111)
Creatinine, Ser: 0.69 mg/dL (ref 0.61–1.24)
GFR, Estimated: >60 mL/min (ref >60)
Glucose, Bld: 76 mg/dL (ref 70–99)
Potassium: 3.4 mmol/L — ABNORMAL LOW (ref 3.5–5.1)
Sodium: 139 mmol/L (ref 135–145)
Total Bilirubin: 0.5 mg/dL (ref 0.0–1.2)
Total Protein: 6.6 g/dL (ref 6.5–8.1)

## 2023-11-24 LAB — CBC
HCT: 40.1 % (ref 39.0–52.0)
Hemoglobin: 13.6 g/dL (ref 13.0–17.0)
MCH: 29.4 pg (ref 26.0–34.0)
MCHC: 33.9 g/dL (ref 30.0–36.0)
MCV: 86.8 fL (ref 80.0–100.0)
Platelets: 134 10*3/uL — ABNORMAL LOW (ref 150–400)
RBC: 4.62 MIL/uL (ref 4.22–5.81)
RDW: 14.7 % (ref 11.5–15.5)
WBC: 2.5 10*3/uL — ABNORMAL LOW (ref 4.0–10.5)
nRBC: 0 % (ref 0.0–0.2)

## 2023-11-24 LAB — ETHANOL: Alcohol, Ethyl (B): 324 mg/dL (ref <15)

## 2023-11-24 MED ORDER — POTASSIUM CHLORIDE 20 MEQ PO PACK
40.0000 meq | PACK | Freq: Two times a day (BID) | ORAL | Status: DC
  Administered 2023-11-24: 40 meq via ORAL
  Filled 2023-11-24: qty 2

## 2023-11-24 NOTE — Discharge Instructions (Addendum)
 It was our pleasure to provide your ER care today - we hope that you feel better. Drink plenty of (non-alcohol  containing) fluids/stay well hydrated.    Avoid alcohol  use as it is harmful to your physical health and mental well-being. See resource guide attached in terms of accessing inpatient or outpatient alcohol  use treatment programs.   Follow up closely with primary care doctor and behavioral health provider in the coming week.  For mental health issues and/or crisis, you may also go directly to the [facility] - they are open 24/7 and walk-ins are welcome.    Return to ER if worse, new symptoms, fevers, chest pain, trouble breathing, severe abdominal pain, or other emergency concern.

## 2023-11-24 NOTE — ED Provider Notes (Signed)
 [place] EMERGENCY DEPARTMENT AT [facility] Provider Note   CSN: [id number] Arrival date & time: 11/24/23  1645     History  Chief Complaint  Patient presents with   Alcohol  Intoxication    [name] is a 43 y.o. adult.  Patient arrives by EMS, was lying at roadside, and indicates recent etoh use, pt not able to quantify amount - has wine cartons in belongings. Denies other drugs use. EMS notes bp normal. Cbg 100. Pt limited historian - level 5 caveat - denies specific physical c/o or pain.   The history is provided by the patient, medical records and the EMS personnel. The history is limited by the condition of the patient.       Home Medications Prior to Admission medications   Medication Sig Start Date End Date Taking? Authorizing Provider  amLODipine  (NORVASC ) 5 MG tablet Take 1 tablet (5 mg total) by mouth daily. Patient taking differently: Take 5 mg by mouth in the morning. 01/18/23   Amoako, Prince, MD  atorvastatin  (LIPITOR ) 40 MG tablet Take 1 tablet (40 mg total) by mouth daily. Patient taking differently: Take 40 mg by mouth at bedtime. 01/18/23   Amoako, Prince, MD  busPIRone  (BUSPAR ) 15 MG tablet Take 15 mg by mouth See admin instructions. Take 15 mg by mouth in the morning, evening, and at bedtime    [provider]  gabapentin  (NEURONTIN ) 600 MG tablet Take 600 mg by mouth in the morning, at noon, and at bedtime.    [provider]  hydrOXYzine  (VISTARIL ) 50 MG capsule Take 50 mg by mouth 3 (three) times daily as needed for anxiety.    [provider]  lamoTRIgine  (LAMICTAL ) 100 MG tablet Take 50 mg by mouth in the morning and at bedtime.    [provider]  Multiple Vitamins-Minerals (PRESERVISION AREDS 2) CAPS Take 1 capsule by mouth in the morning and at bedtime.    [provider]  pantoprazole  (PROTONIX ) 40 MG tablet Take 1 tablet (40 mg total) by mouth daily. Follow up with gastroenterology as an  outpatient 11/15/23   Jearldine Mina, DO  prazosin  (MINIPRESS ) 1 MG capsule Take 1 mg by mouth at bedtime.    [provider]  risperiDONE  (RISPERDAL ) 1 MG tablet Take 1 mg by mouth at bedtime.    [provider]  traZODone  (DESYREL ) 150 MG tablet Take 300 mg by mouth at bedtime.    [provider]      Allergies    Codeine, Penicillins, Morphine , Coconut (cocos nucifera), and Grapefruit concentrate    Review of Systems   Review of Systems  Constitutional:  Negative for fever.  Respiratory:  Negative for shortness of breath.   Cardiovascular:  Negative for chest pain.  Gastrointestinal:  Negative for abdominal pain.  Musculoskeletal:  Negative for back pain and neck pain.  Neurological:  Negative for headaches.    Physical Exam Updated Vital Signs BP 109/73   Pulse (!) 103   Temp 98.5 F (36.9 C) (Axillary)   Resp 16   SpO2 99%  Physical Exam Vitals and nursing note reviewed.  Constitutional:      Appearance: Normal appearance. He is well-developed.  HENT:     Head: Atraumatic.     Nose: Nose normal.     Mouth/Throat:     Mouth: Mucous membranes are moist.     Pharynx: Oropharynx is clear.  Eyes:     General: No scleral icterus.    Conjunctiva/sclera:  Conjunctivae normal.     Pupils: Pupils are equal, round, and reactive to light.  Neck:     Vascular: No carotid bruit.     Trachea: No tracheal deviation.     Comments: No stiffness or rigidity.  Cardiovascular:     Rate and Rhythm: Normal rate and regular rhythm.     Pulses: Normal pulses.     Heart sounds: Normal heart sounds. No murmur heard.    No friction rub. No gallop.  Pulmonary:     Effort: Pulmonary effort is normal. No accessory muscle usage or respiratory distress.     Breath sounds: Normal breath sounds.  Abdominal:     General: Bowel sounds are normal. There is no distension.     Palpations: Abdomen is soft.     Tenderness: There is no abdominal tenderness. There is no  guarding.  Genitourinary:    Comments: No cva tenderness. Musculoskeletal:        General: No swelling or tenderness.     Cervical back: Normal range of motion and neck supple. No rigidity or tenderness.     Right lower leg: No edema.     Left lower leg: No edema.     Comments: CTLS spine, non tender, aligned, no step off. Good passive rom bil extremities without pain or focal bony tenderness.   Skin:    General: Skin is warm and dry.     Findings: No rash.  Neurological:     Mental Status: He is alert.     Comments: Awake. No gross aphasia. Moves bil extremities purposefully w  good strength.   Psychiatric:     Comments: Intoxicated. Does respond to questions. Denies any thoughts/plan of self harm.      ED Results / Procedures / Treatments   Labs (all labs ordered are listed, but only abnormal results are displayed) Results for orders placed or performed during the hospital encounter of 11/24/23  CBC   Collection Time: 11/24/23  5:11 PM  Result Value Ref Range   WBC 2.5 (L) 4.0 - 10.5 K/uL   RBC 4.62 4.22 - 5.81 MIL/uL   Hemoglobin 13.6 13.0 - 17.0 g/dL   HCT 78.2 95.6 - 21.3 %   MCV 86.8 80.0 - 100.0 fL   MCH 29.4 26.0 - 34.0 pg   MCHC 33.9 30.0 - 36.0 g/dL   RDW 08.6 57.8 - 46.9 %   Platelets 134 (L) 150 - 400 K/uL   nRBC 0.0 0.0 - 0.2 %  Comprehensive metabolic panel with GFR   Collection Time: 11/24/23  5:11 PM  Result Value Ref Range   Sodium 139 135 - 145 mmol/L   Potassium 3.4 (L) 3.5 - 5.1 mmol/L   Chloride 104 98 - 111 mmol/L   CO2 22 22 - 32 mmol/L   Glucose, Bld 76 70 - 99 mg/dL   BUN 6 6 - 20 mg/dL   Creatinine, Ser 6.29 0.61 - 1.24 mg/dL   Calcium  8.2 (L) 8.9 - 10.3 mg/dL   Total Protein 6.6 6.5 - 8.1 g/dL   Albumin 3.3 (L) 3.5 - 5.0 g/dL   AST 528 (H) 15 - 41 U/L   ALT 33 0 - 44 U/L   Alkaline Phosphatase 37 (L) 38 - 126 U/L   Total Bilirubin 0.5 0.0 - 1.2 mg/dL   GFR, Estimated >41 >32 mL/min   Anion gap 13 5 - 15  Ethanol   Collection Time:  11/24/23  5:11 PM  Result Value Ref  Range   Alcohol , Ethyl (B) 324 (HH) <15 mg/dL   CT CHEST ABDOMEN PELVIS W CONTRAST Result Date: 11/14/2023 CLINICAL DATA:  Monitoring lymphadenopathy, history of kidney cancer and breast cancer. * Tracking Code: BO * EXAM: CT CHEST, ABDOMEN, AND PELVIS WITH CONTRAST TECHNIQUE: Multidetector CT imaging of the chest, abdomen and pelvis was performed following the standard protocol during bolus administration of intravenous contrast. RADIATION DOSE REDUCTION: This exam was performed according to the departmental dose-optimization program which includes automated exposure control, adjustment of the mA and/or kV according to patient size and/or use of iterative reconstruction technique. CONTRAST:  OMNIPAQUE  IOHEXOL  300 MG/ML  SOLN COMPARISON:  Multiple priors including CTs August 15, 2023 and August 05, 2023 FINDINGS: CT CHEST FINDINGS Cardiovascular: Normal caliber thoracic aorta. Gas in the pulmonary outflow tract reflect sequela of vascular access. Normal size heart. No significant pericardial effusion/thickening. Mediastinum/Nodes: Prominent axillary lymph nodes are stable from prior. For reference: -Left axillary lymph node measures 10 mm in short axis on image 23/2, unchanged. -right axillary lymph node measures 9 mm in short axis on image 15/2 previously 10 mm. No progressive thoracic adenopathy. Lungs/Pleura: New 7 mm right lower lobe pulmonary nodule on image 58/6. New 6 mm left lower lobe pulmonary nodule on image 84/6. Previously identified right lower lobe ground-glass nodule is not confidently seen. Scattered atelectasis/scarring. Hypoventilatory change in the dependent lungs. Musculoskeletal: Similar subcutaneous nodularity throughout the anterior chest wall and upper abdomen. No aggressive lytic or blastic lesion of bone. CT ABDOMEN PELVIS FINDINGS Hepatobiliary: No suspicious hepatic lesion. Gallbladder is unremarkable. No biliary ductal dilation. Pancreas:  No pancreatic ductal dilation or evidence of acute inflammation. Spleen: No splenomegaly. Adrenals/Urinary Tract: Stable 6 mm nodule along the inferior aspect right adrenal gland on image 55/2 present and stable dating back to at least October 20, 2017 and not hypermetabolic on prior PET-CT April 18, 2023. Left adrenal gland appears normal. No hydronephrosis. Postsurgical change of partial left nephrectomy without new suspicious nodularity in the surgical bed. Urinary bladder is unremarkable for degree of distension. Stomach/Bowel: Stomach is unremarkable for degree of distension. No pathologic dilation of small or large bowel. No evidence of acute bowel inflammation. Similar submucosal fibrofatty infiltration of the ascending and proximal transverse colon. Vascular/Lymphatic: Normal caliber thoracic aorta. No pathologically enlarged abdominal or pelvic lymph nodes. Prominent inguinal lymph nodes are similar to prior examination for instance a right inguinal lymph node measuring 9 mm on short axis on image 118/2 previously measured 10 mm. Reproductive: Prostate is unremarkable. Other: Subcutaneous nodularity along the bilateral flanks and posterior gluteal/lumbar region is stable from prior. Musculoskeletal: No aggressive lytic or blastic lesion of bone. IMPRESSION: 1. Similar prominent axillary and inguinal lymph nodes. No progressive adenopathy in the chest, abdomen or pelvis. 2. New 7 mm right lower lobe and 6 mm left lower lobe pulmonary nodules, nonspecific and possibly infectious/inflammatory although metastatic disease is not excluded. Suggest attention on short-term interval follow-up imaging. 3. Similar subcutaneous nodularity throughout the chest, abdomen or pelvis compatible with neoplasm/metastasis. 4. Similar submucosal fibrofatty infiltration of the ascending and proximal transverse colon, nonspecific but can be seen in the setting of chronic inflammatory bowel disease. Electronically Signed   By:  Tama Fails M.D.   On: 11/14/2023 15:38    EKG None  Radiology No results found.  Procedures Procedures    Medications Ordered in ED Medications  potassium chloride  (KLOR-CON ) packet 40 mEq (40 mEq Oral Given 11/24/23 2135)    ED Course/ Medical Decision  Making/ A&P                                 Medical Decision Making Problems Addressed: Acute alcoholic intoxication without complication [facility]): acute illness or injury with systemic symptoms that poses a threat to life or bodily functions Alcohol  use disorder: chronic illness or injury with exacerbation, progression, or side effects of treatment that poses a threat to life or bodily functions Hypokalemia: acute illness or injury Thrombocytopenia (HCC): acute illness or injury  Amount and/or Complexity of Data Reviewed Independent Historian: EMS    Details: hx External Data Reviewed: notes. Labs: ordered. Decision-making details documented in ED Course.  Risk Prescription drug management. Decision regarding hospitalization.  Iv ns. Continuous pulse ox and cardiac monitoring. Labs ordered/sent.   Differential diagnosis includes etoh intoxication, sud, encephalopathy, etc. Dispo decision including potential need for admission considered - will get labs and reassess.   Reviewed nursing notes and prior charts for additional history. External reports reviewed. Additional history from: EMS.   Cardiac monitor: sinus rhythm, rate 90.  Labs reviewed/interpreted by me - hgb normal. Chem largely unremarkable, k sl low. Kcl po.   Po fluids/food. Metabolize.   Ambulates in ED with steady gait. Has eaten/drank. No tremor or shakes.   Pt currently appears stable for ED d/c. Rec avoiding etoh use, and encouraged pursuit of rehab/tx program, resources provided.   Also rec close pcp f/u.  Return precautions provided.          Final Clinical Impression(s) / ED Diagnoses Final diagnoses:  Acute alcoholic intoxication  without complication (HCC)  Alcohol  use disorder  Thrombocytopenia (HCC)  Hypokalemia    Rx / DC Orders ED Discharge Orders     None         Guadalupe Lee, MD 11/24/23 2149
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
## 2023-11-24 NOTE — ED Notes (Signed)
 Pt ambulated to the bathroom with standby assist. Pt also tolerated fluids and foods with no issues.

## 2023-11-24 NOTE — ED Notes (Signed)
 Pt placed on 2L NC
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
## 2023-12-16 ENCOUNTER — Other Ambulatory Visit: Payer: Self-pay | Admitting: Physician Assistant

## 2023-12-16 DIAGNOSIS — R918 Other nonspecific abnormal finding of lung field: Secondary | ICD-10-CM

## 2023-12-16 NOTE — Progress Notes (Unsigned)
 I called [name] to confirm that we will make a referral to [facility] pulmonology (lung nodule clinic) to monitor the lung nodules seen on most recent CT scan from 11/14/2023. No further oncologic workup is required as prior workup ruled out malignancy. [name] expressed understanding of the plan provided.

## 2023-12-21 ENCOUNTER — Encounter: Payer: Self-pay | Admitting: Medical Oncology

## 2023-12-21 NOTE — Progress Notes (Signed)
 [facility]  Outgoing call to [facility] to confirm that they had received referral from our office for patient and to see if he has been scheduled.  Office confirmed receipt of referral and informed me due to scheduling, could take up to two weeks to schedule appointment from date of receipt of referral.  Office thanked for their time.   Esperanza Hedges, RN, BSN, [facility] Nurse Navigator, [facility] 12/21/2023 11:27 AM

## 2023-12-29 ENCOUNTER — Encounter: Payer: Self-pay | Admitting: Medical Oncology

## 2023-12-29 NOTE — Progress Notes (Signed)
 [facility]) for Malignancy  Hand-off Note  12/29/23 11:25 AM  [name] 1981/04/10 [id number]  Cancer Care, Care Team: [name] Clay, MD [name] Awe, PA-C [name] Hearing, PA-C [name], [facility] Nurse Navigator  [name] was referred to Cancer Care on 03/30/2023 for evaluation of:  lymphadenopathy.  The patient's diagnostic work-up included: labs NM PET Whole Body (04/18/2023),  CT Chest Abdomen Pelvis (11/14/2023),  US  Core Biopsy, Lymph Nodes (04/27/2023)  The patient was found to not have malignancy at this time, as evaluated for the reason for referral stated above.  The recommended follow-up provided to the patient includes:  fax referral to [facility] (lung nodule clinic) to monitor lung nodules seen on most recent CT scan from 11/14/2023 was made and confirmation of receipt on 12/21/23.  We thank you for allowing us  to assist in [name]'s care.  The initial and most recent Progress Notes, labs, imaging, procedure(s), and/or consult notes have been routed to you through [facility] or faxed to your office for continuity of care.   [name] Hedges, RN, BSN, [facility] On[facility] Nurse Navigator, [facility] 12/29/2023 12:13 PM

## 2024-01-29 IMAGING — CR DG CHEST 2V
2 series · 2 of 2 positions shown · non-contrast
Comparison: 03/01/2021

CLINICAL DATA: Syncope

EXAM:
CHEST - 2 VIEW

[w chest lat]
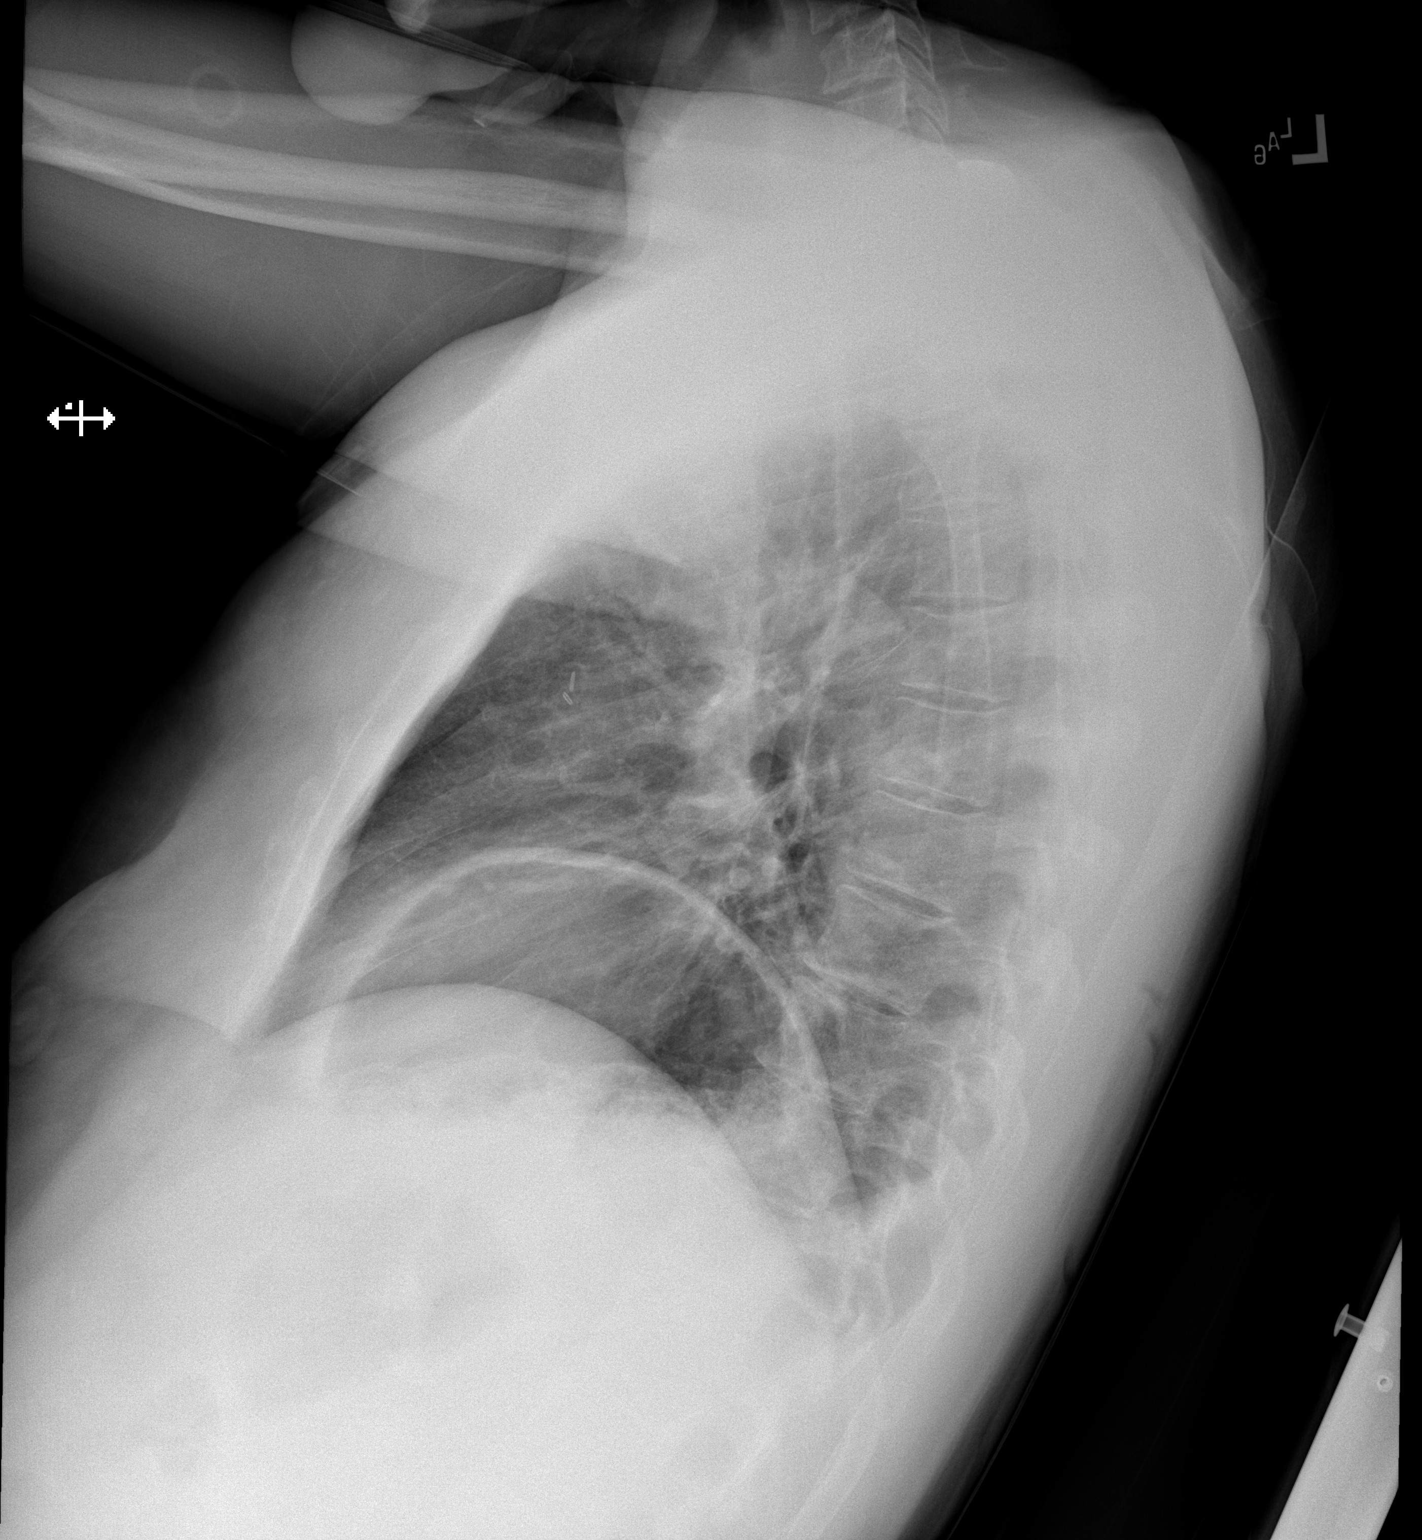

[x chest ap]
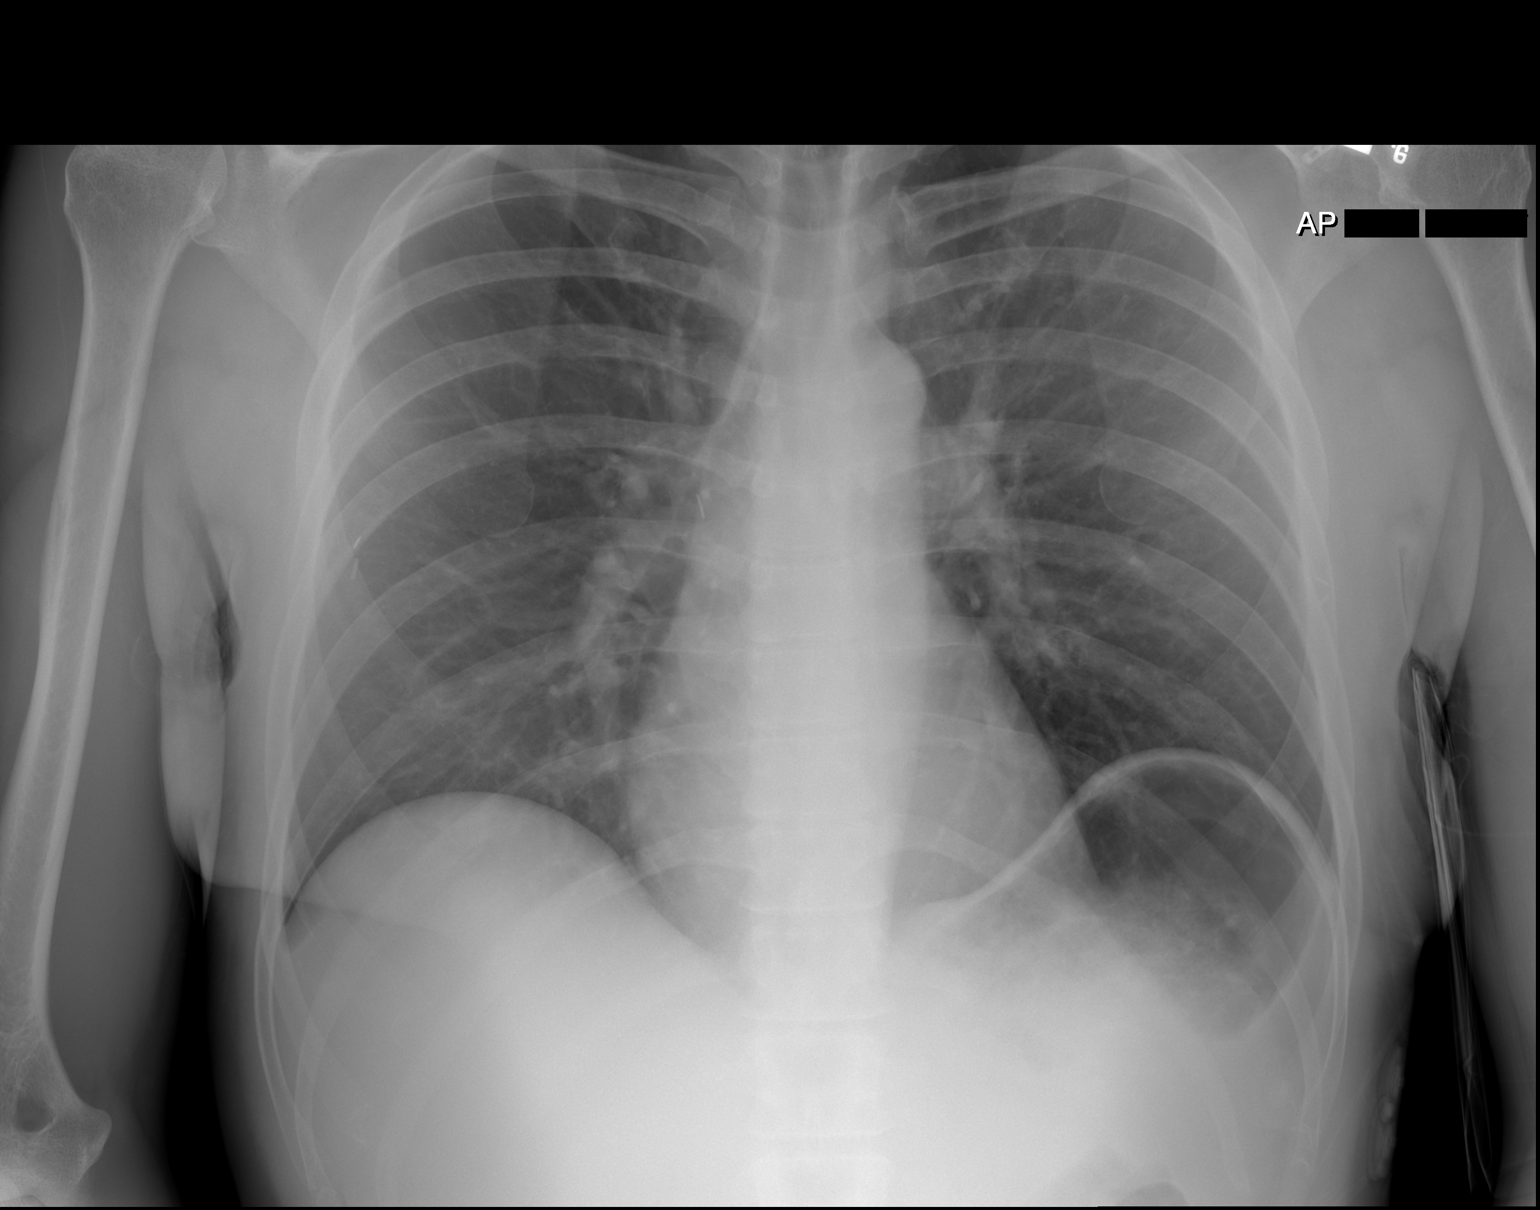

[2 of 2 positions shown; findings below may reference images not displayed]

FINDINGS: Lungs are clear.  No pleural effusion or pneumothorax.

The heart is normal in size.

Status post ORIF of the left clavicle. Thoracic spine is within
normal limits.
IMPRESSION: Normal chest radiographs.

## 2024-02-07 ENCOUNTER — Other Ambulatory Visit: Payer: Self-pay | Admitting: Student

## 2024-02-07 DIAGNOSIS — I10 Essential (primary) hypertension: Secondary | ICD-10-CM

## 2024-02-19 IMAGING — DX DG CHEST 2V
2 series · 2 of 2 positions shown · non-contrast
Comparison: 08/27/2021

CLINICAL DATA: Chest pain

EXAM:
CHEST - 2 VIEW

[chest pa]
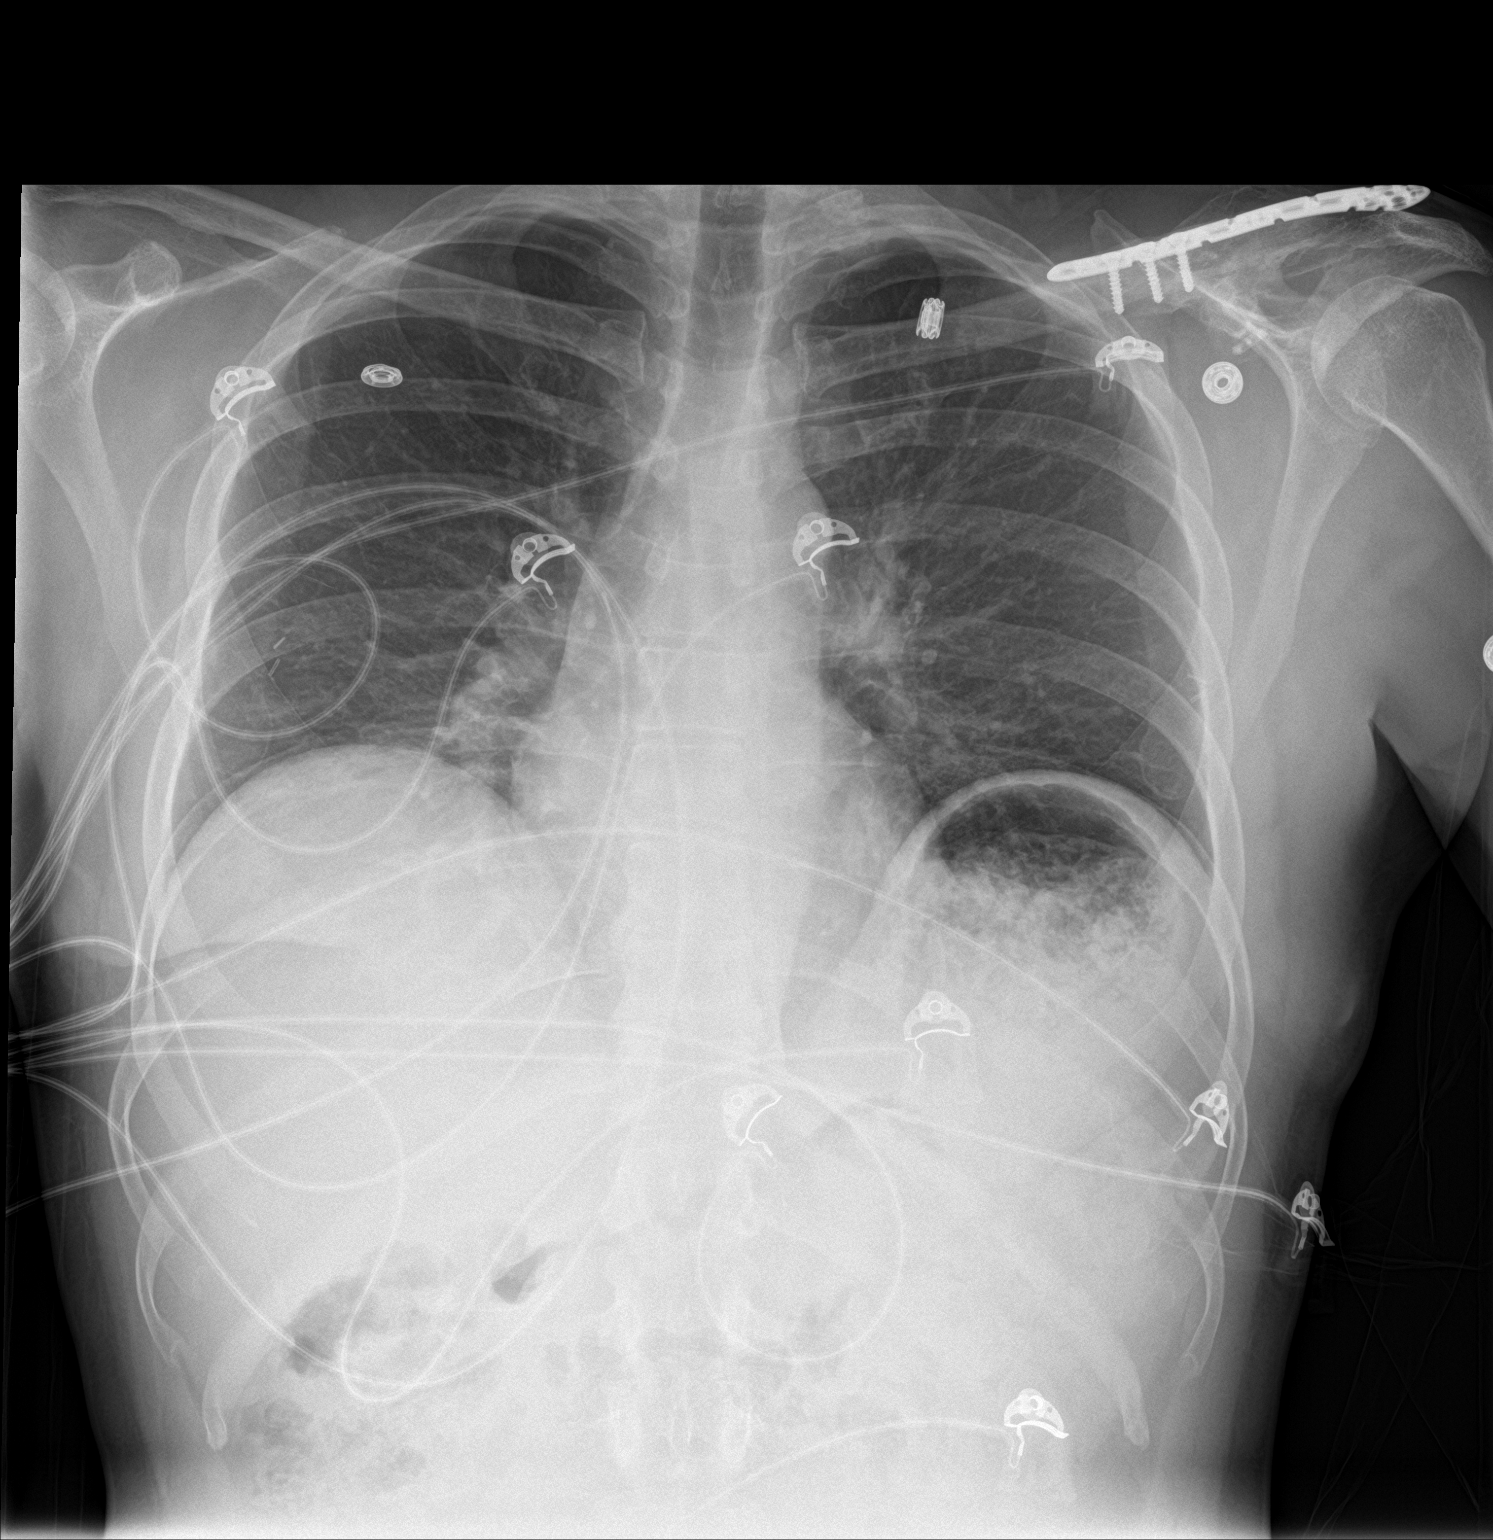

[chest lat]
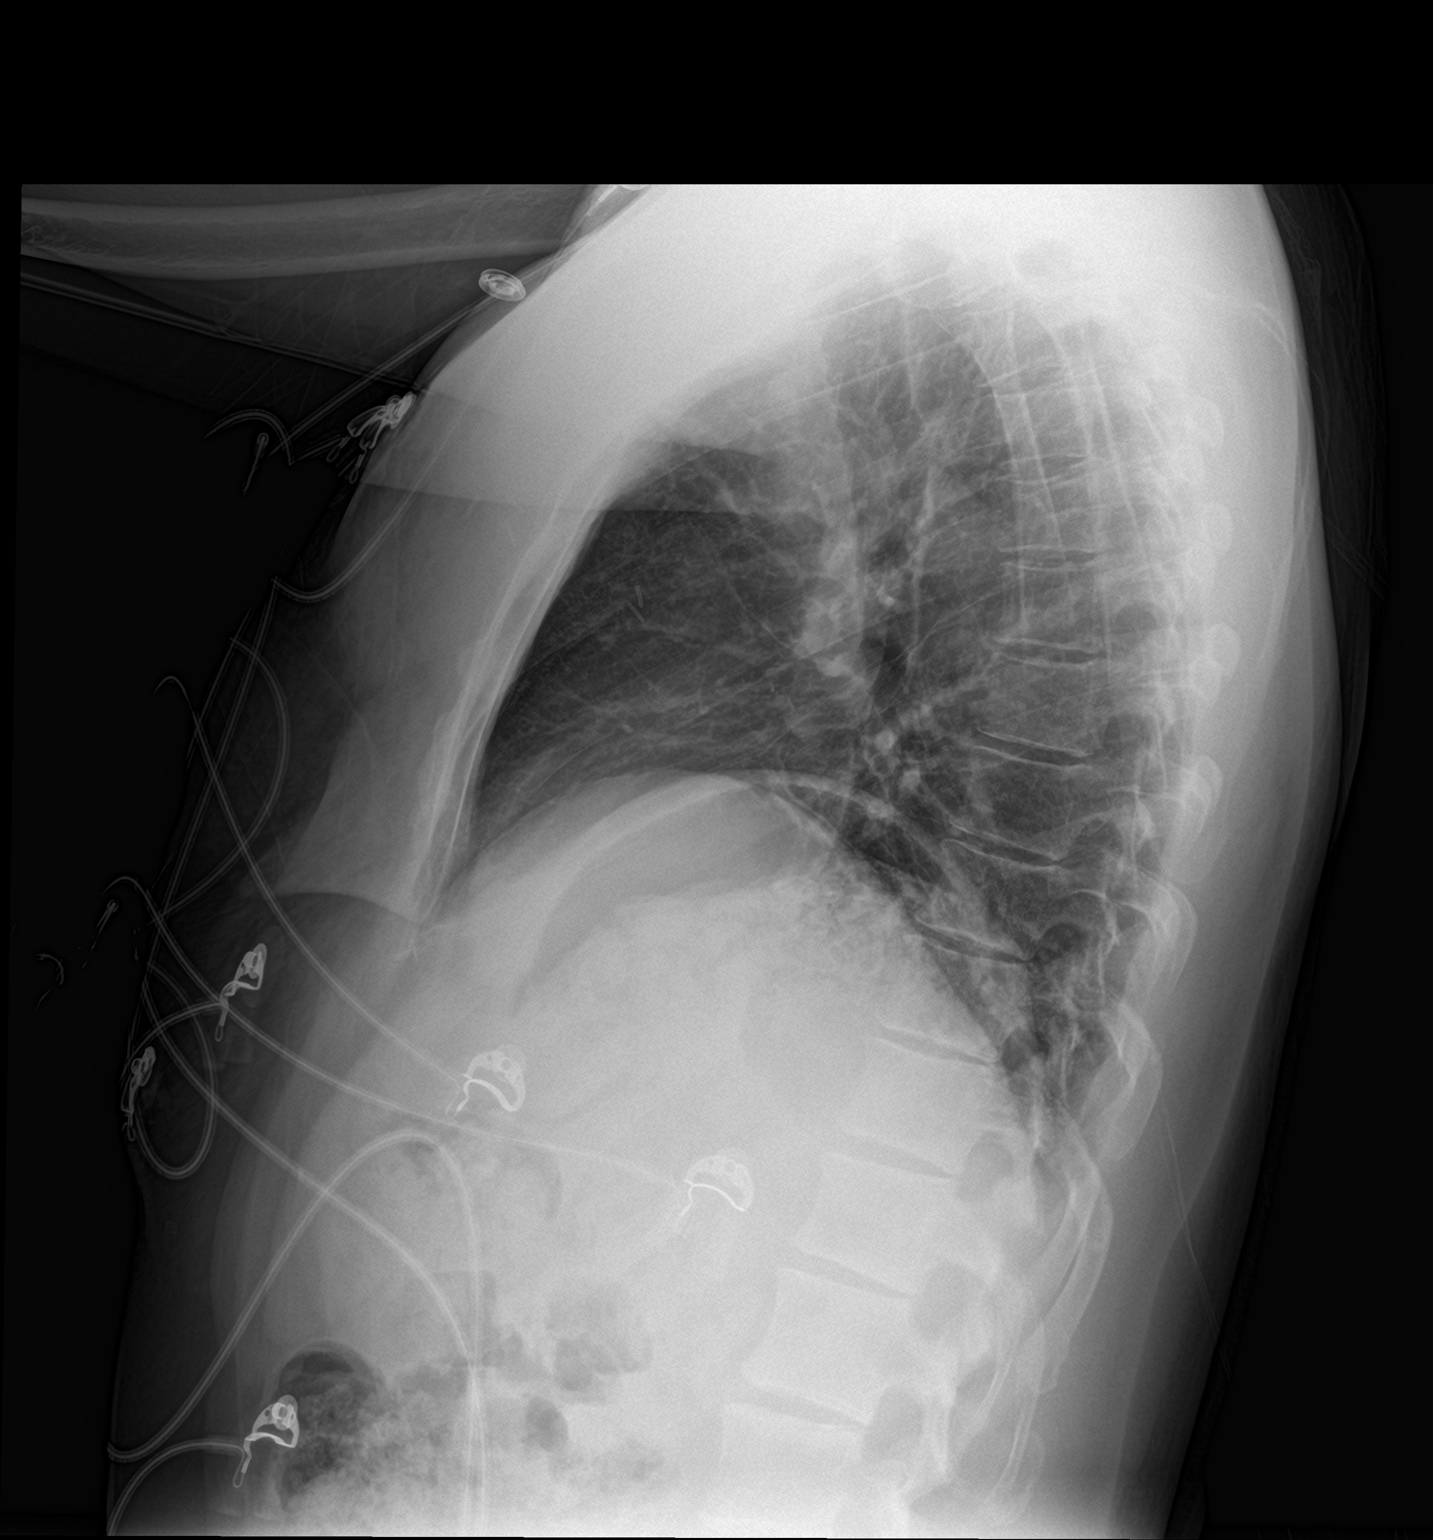

[2 of 2 positions shown; findings below may reference images not displayed]

FINDINGS: Heart size and vascularity normal. Mild bibasilar atelectasis. Upper
lobes clear

Plate fixation of chronic left clavicle fracture.
IMPRESSION: Mild bibasilar atelectasis.

## 2024-02-23 ENCOUNTER — Inpatient Hospital Stay: Payer: MEDICAID | Attending: Physician Assistant

## 2024-02-23 ENCOUNTER — Other Ambulatory Visit: Payer: Self-pay | Admitting: Hematology and Oncology

## 2024-02-23 ENCOUNTER — Inpatient Hospital Stay: Payer: MEDICAID | Admitting: Hematology and Oncology

## 2024-02-23 DIAGNOSIS — R591 Generalized enlarged lymph nodes: Secondary | ICD-10-CM
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
## 2024-02-27 ENCOUNTER — Emergency Department (HOSPITAL_COMMUNITY)
Admission: EM | Admit: 2024-02-27 | Discharge: 2024-02-27 | Disposition: A | Discharge status: Home / Self Care | Payer: MEDICAID | Attending: Emergency Medicine | Admitting: Emergency Medicine

## 2024-02-27 ENCOUNTER — Emergency Department (HOSPITAL_COMMUNITY): Payer: MEDICAID

## 2024-02-27 ENCOUNTER — Encounter (HOSPITAL_COMMUNITY): Payer: Self-pay

## 2024-02-27 DIAGNOSIS — R197 Diarrhea, unspecified: Secondary | ICD-10-CM | POA: Insufficient documentation

## 2024-02-27 DIAGNOSIS — R079 Chest pain, unspecified: Secondary | ICD-10-CM | POA: Diagnosis not present

## 2024-02-27 DIAGNOSIS — R059 Cough, unspecified: Secondary | ICD-10-CM | POA: Insufficient documentation

## 2024-02-27 DIAGNOSIS — R112 Nausea with vomiting, unspecified: Secondary | ICD-10-CM | POA: Diagnosis present

## 2024-02-27 DIAGNOSIS — R1084 Generalized abdominal pain: Secondary | ICD-10-CM | POA: Diagnosis not present

## 2024-02-27 DIAGNOSIS — Z85528 Personal history of other malignant neoplasm of kidney: Secondary | ICD-10-CM | POA: Insufficient documentation

## 2024-02-27 DIAGNOSIS — R000 Tachycardia, unspecified: Secondary | ICD-10-CM | POA: Diagnosis not present

## 2024-02-27 DIAGNOSIS — R0602 Shortness of breath: Secondary | ICD-10-CM | POA: Insufficient documentation

## 2024-02-27 DIAGNOSIS — Z853 Personal history of malignant neoplasm of breast: Secondary | ICD-10-CM | POA: Insufficient documentation

## 2024-02-27 LAB — URINALYSIS, ROUTINE W REFLEX MICROSCOPIC
Bacteria, UA: NONE SEEN
Bilirubin Urine: NEGATIVE
Glucose, UA: NEGATIVE mg/dL
Ketones, ur: 5 mg/dL — AB
Leukocytes,Ua: NEGATIVE
Nitrite: NEGATIVE
Protein, ur: 30 mg/dL — AB
Specific Gravity, Urine: 1.013 (ref 1.005–1.030)
pH: 5 (ref 5.0–8.0)

## 2024-02-27 LAB — COMPREHENSIVE METABOLIC PANEL WITH GFR
ALT: 73 U/L — ABNORMAL HIGH (ref 0–44)
AST: 190 U/L — ABNORMAL HIGH (ref 15–41)
Albumin: 3.6 g/dL (ref 3.5–5.0)
Alkaline Phosphatase: 37 U/L — ABNORMAL LOW (ref 38–126)
Anion gap: 12 (ref 5–15)
BUN: 7 mg/dL (ref 6–20)
CO2: 25 mmol/L (ref 22–32)
Calcium: 8.5 mg/dL — ABNORMAL LOW (ref 8.9–10.3)
Chloride: 97 mmol/L — ABNORMAL LOW (ref 98–111)
Creatinine, Ser: 0.73 mg/dL (ref 0.61–1.24)
GFR, Estimated: >60 mL/min (ref >60)
Glucose, Bld: 69 mg/dL — ABNORMAL LOW (ref 70–99)
Potassium: 5.4 mmol/L — ABNORMAL HIGH (ref 3.5–5.1)
Sodium: 134 mmol/L — ABNORMAL LOW (ref 135–145)
Total Bilirubin: 0.8 mg/dL (ref 0.0–1.2)
Total Protein: 7.4 g/dL (ref 6.5–8.1)

## 2024-02-27 LAB — RESP PANEL BY RT-PCR (RSV, FLU A&B, COVID)  RVPGX2
Influenza A by PCR: NEGATIVE
Influenza B by PCR: NEGATIVE
Resp Syncytial Virus by PCR: NEGATIVE
SARS Coronavirus 2 by RT PCR: NEGATIVE

## 2024-02-27 LAB — I-STAT CG4 LACTIC ACID, ED
Lactic Acid, Venous: 2.2 mmol/L (ref 0.5–1.9)
Lactic Acid, Venous: 2.1 mmol/L (ref 0.5–1.9)

## 2024-02-27 LAB — CBC
HCT: 42.7 % (ref 39.0–52.0)
Hemoglobin: 14.3 g/dL (ref 13.0–17.0)
MCH: 30.9 pg (ref 26.0–34.0)
MCHC: 33.5 g/dL (ref 30.0–36.0)
MCV: 92.2 fL (ref 80.0–100.0)
Platelets: 129 K/uL — ABNORMAL LOW (ref 150–400)
RBC: 4.63 MIL/uL (ref 4.22–5.81)
RDW: 13.5 % (ref 11.5–15.5)
WBC: 3.1 K/uL — ABNORMAL LOW (ref 4.0–10.5)
nRBC: 0 % (ref 0.0–0.2)

## 2024-02-27 LAB — POTASSIUM: Potassium: 4.5 mmol/L (ref 3.5–5.1)

## 2024-02-27 LAB — TROPONIN I (HIGH SENSITIVITY)
Troponin I (High Sensitivity): 11 ng/L (ref <18)
Troponin I (High Sensitivity): 10 ng/L (ref <18)

## 2024-02-27 LAB — LIPASE, BLOOD: Lipase: 24 U/L (ref 11–51)

## 2024-02-27 MED ORDER — LACTATED RINGERS IV BOLUS
1000.0000 mL | Freq: Once | INTRAVENOUS | Status: AC
  Administered 2024-02-27 (×2): 1000 mL via INTRAVENOUS

## 2024-02-27 MED ORDER — ONDANSETRON 4 MG PO TBDP: 4.0000 mg | ORAL_TABLET | Freq: Three times a day (TID) | ORAL | 0 refills | Status: AC | PRN

## 2024-02-27 MED ORDER — LOPERAMIDE HCL 2 MG PO CAPS: 2.0000 mg | ORAL_CAPSULE | Freq: Four times a day (QID) | ORAL | 0 refills | Status: DC | PRN

## 2024-02-27 MED ORDER — GLUCOSE 40 % PO GEL
1.0000 | Freq: Once | ORAL | Status: AC
Start: 2024-02-27 — End: 2024-02-27
  Administered 2024-02-27 (×2): 31 g via ORAL
  Filled 2024-02-27: qty 1

## 2024-02-27 MED ORDER — IOHEXOL 350 MG/ML SOLN
100.0000 mL | Freq: Once | INTRAVENOUS | Status: AC | PRN
  Administered 2024-02-27 (×2): 100 mL via INTRAVENOUS

## 2024-02-27 NOTE — Discharge Instructions (Signed)
 Take Zofran  as needed for nausea and vomiting.  Take Imodium  as needed for loose stool.  Try bland diet.  Make sure staying well-hydrated primarily water you can alternate Gatorade and Pedialyte.  Please follow-up with GI and return to emergency room with new or worsening symptoms.

## 2024-02-27 NOTE — ED Provider Notes (Signed)
  Physical Exam  BP 121/87   Pulse 91   Temp 98 F (36.7 C)   Resp 18   SpO2 95%   Physical Exam  Procedures  Procedures  ED Course / MDM    Medical Decision Making Amount and/or Complexity of Data Reviewed Labs: ordered. Radiology: ordered.  Risk OTC drugs. Prescription drug management.   Received at signout from prior ED provider.  In short patient had nausea vomiting diarrhea.  Plan was to recheck potassium as well as urine.  Potassium was normal on recheck.  Urine without signs of infection.  Patient tolerating oral intake.  Vital signs have improved.  Patient is requesting discharge.  Will give GI follow-up and symptom management.     Ozan Maclay N, PA-C 02/27/24 [id number]    Jerral Meth, MD 02/27/24 2259

## 2024-02-27 NOTE — ED Provider Notes (Signed)
 [facility] Emergency Department Provider Note MRN:  [id number]  Arrival date & time: 02/27/24     Chief Complaint   Abdominal Pain   History of Present Illness   [name] is a 43 y.o. year-old adult presents to the ED with chief complaint of nausea, vomiting, and diarrhea.  Reports associated chest pain and abdominal pain.  Reports associated shortness of breath and cough.  States that symptoms have developed and worsened over the past 2 weeks.  Patient denies measured fever.  No treatments prior to arrival.  Patient reports history of kidney and breast cancer, but states they are not under current treatment.  Patient concerned about COVID, flu, or pneumonia.  Patient states that they have had trouble making it to the bathroom and have been defecating on themselves..  History provided by patient.   Review of Systems  Pertinent positive and negative review of systems noted in HPI.    Physical Exam   Vitals:   02/27/24 0530 02/27/24 0626  BP: 121/87   Pulse: 91   Resp: 18   Temp:  98 F (36.7 C)  SpO2: 95%     CONSTITUTIONAL:  non toxic-appearing, NAD NEURO:  Alert and oriented x 3, CN 3-12 grossly intact EYES:  eyes equal and reactive ENT/NECK:  Supple, no stridor  CARDIO:  tachycardic, regular rhythm, appears well-perfused  PULM:  No respiratory distress, no wheezing GI/GU:  non-distended, generalized abdominal tenderness MSK/SPINE:  No gross deformities, no edema, moves all extremities  SKIN:  no rash, atraumatic   *Additional and/or pertinent findings included in MDM below  Diagnostic and Interventional Summary    EKG Interpretation Date/Time:    Ventricular Rate:    PR Interval:    QRS Duration:    QT Interval:    QTC Calculation:   R Axis:      Text Interpretation:         Labs Reviewed  COMPREHENSIVE METABOLIC PANEL WITH GFR - Abnormal; Notable for the following components:      Result Value   Sodium 134 (*)     Potassium 5.4 (*)    Chloride 97 (*)    Glucose, Bld 69 (*)    Calcium  8.5 (*)    AST 190 (*)    ALT 73 (*)    Alkaline Phosphatase 37 (*)    All other components within normal limits  CBC - Abnormal; Notable for the following components:   WBC 3.1 (*)    Platelets 129 (*)    All other components within normal limits  URINALYSIS, ROUTINE W REFLEX MICROSCOPIC - Abnormal; Notable for the following components:   APPearance HAZY (*)    Hgb urine dipstick SMALL (*)    Ketones, ur 5 (*)    Protein, ur 30 (*)    All other components within normal limits  I-STAT CG4 LACTIC ACID, ED - Abnormal; Notable for the following components:   Lactic Acid, Venous 2.2 (*)    All other components within normal limits  I-STAT CG4 LACTIC ACID, ED - Abnormal; Notable for the following components:   Lactic Acid, Venous 2.1 (*)    All other components within normal limits  RESP PANEL BY RT-PCR (RSV, FLU A&B, COVID)  RVPGX2  LIPASE, BLOOD  POTASSIUM  TROPONIN I (HIGH SENSITIVITY)  TROPONIN I (HIGH SENSITIVITY)    CT Angio Chest PE W and/or Wo Contrast  Final Result    CT ABDOMEN PELVIS W CONTRAST  Final Result  DG Chest 2 View  Final Result      Medications  lactated ringers  bolus 1,000 mL (0 mLs Intravenous Stopped 02/27/24 0538)  dextrose  (GLUTOSE) oral gel 40% (peds > 20kg and adults) (31 g Oral Given 02/27/24 0538)  iohexol  (OMNIPAQUE ) 350 MG/ML injection 100 mL (100 mLs Intravenous Contrast Given 02/27/24 0541)     Procedures  /  Critical Care Procedures  ED Course and Medical Decision Making  I have reviewed the triage vital signs, the nursing notes, and pertinent available records from the EMR.  Social Determinants Affecting Complexity of Care: Patient has no clinically significant social determinants affecting this chief complaint..   ED Course:    Medical Decision Making Amount and/or Complexity of Data Reviewed Labs: ordered. Radiology: ordered.  Risk OTC  drugs. Prescription drug management.         Consultants:    Treatment and Plan: Patient signed out to oncoming team.  Plan: Followup on UA and repeat K.  ?pyelo.  Anticipate admission.    Final Clinical Impressions(s) / ED Diagnoses     ICD-10-CM   1. Nausea vomiting and diarrhea  R11.2    R19.7       ED Discharge Orders          Ordered    ondansetron  (ZOFRAN -ODT) 4 MG disintegrating tablet  Every 8 hours PRN        02/27/24 0808    loperamide  (IMODIUM ) 2 MG capsule  4 times daily PRN        02/27/24 0808              Discharge Instructions Discussed with and Provided to Patient:     Discharge Instructions      Take Zofran  as needed for nausea and vomiting.  Take Imodium  as needed for loose stool.  Try bland diet.  Make sure staying well-hydrated primarily water you can alternate Gatorade and Pedialyte.  Please follow-up with GI and return to emergency room with new or worsening symptoms.       Vicky Charleston, PA-C 02/27/24 2158    Jerral Meth, MD 02/27/24 2258

## 2024-02-27 NOTE — ED Triage Notes (Signed)
 Patient arrived with complaints of NVD, abdominal pain, central chest pain, cough and shortness of breath, over the last two weeks.

## 2024-03-02 ENCOUNTER — Other Ambulatory Visit: Payer: Self-pay | Admitting: Student

## 2024-03-02 DIAGNOSIS — Z8719 Personal history of other diseases of the digestive system: Secondary | ICD-10-CM

## 2024-03-09 ENCOUNTER — Encounter: Payer: Self-pay | Admitting: Acute Care

## 2024-03-09 ENCOUNTER — Ambulatory Visit: Payer: MEDICAID | Admitting: Acute Care

## 2024-03-09 VITALS — BP 134/92 | HR 106 | Temp 98.7°F | Ht 66.0 in | Wt 155.6 lb

## 2024-03-09 DIAGNOSIS — R911 Solitary pulmonary nodule: Secondary | ICD-10-CM

## 2024-03-09 DIAGNOSIS — R918 Other nonspecific abnormal finding of lung field: Secondary | ICD-10-CM | POA: Diagnosis not present

## 2024-03-09 DIAGNOSIS — Z8719 Personal history of other diseases of the digestive system: Secondary | ICD-10-CM

## 2024-03-09 DIAGNOSIS — F17210 Nicotine dependence, cigarettes, uncomplicated: Secondary | ICD-10-CM | POA: Diagnosis not present

## 2024-03-09 DIAGNOSIS — Z85528 Personal history of other malignant neoplasm of kidney: Secondary | ICD-10-CM

## 2024-03-09 DIAGNOSIS — R9389 Abnormal findings on diagnostic imaging of other specified body structures: Secondary | ICD-10-CM

## 2024-03-09 DIAGNOSIS — F17200 Nicotine dependence, unspecified, uncomplicated: Secondary | ICD-10-CM
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
## 2024-03-09 NOTE — Patient Instructions (Signed)
 It is good to see you today. We have reviewed your CT scans. Some of the initial nodules of concern noted in the April 2025 scan have resolved. One of the lymph nodes that was noted in the April 2025 scan has reduced in size from 9 mm to 8 mm. There are still extensive anterior chest wall nodules noted on the scan. We have reviewed the PET scan done 9 /2024. This scan showed lymph nodes that were concerning at the time. Plan will be for a PET scan to reevaluate these nodules and lymph nodes. You will get a call to get this scheduled. Please follow-up with me 1 to 2 weeks after the scan has been completed to review the results. This will also give us  an opportunity to look to see if there is increased metabolic activity in your colon. Please follow-up with gastroenterology regarding getting colonoscopy done. Please contact office for sooner follow up if symptoms do not improve or worsen or seek emergency care

## 2024-03-15 ENCOUNTER — Other Ambulatory Visit (HOSPITAL_COMMUNITY): Payer: MEDICAID

## 2024-03-16 ENCOUNTER — Telehealth: Payer: Self-pay | Admitting: Acute Care

## 2024-03-16 DIAGNOSIS — R918 Other nonspecific abnormal finding of lung field: Secondary | ICD-10-CM

## 2024-03-16 NOTE — Telephone Encounter (Signed)
 I have called to do a PEER to peer. The health plan is denying it. Even though he has a history of renal cell carcinoma and has chest wall nodularity, and weight loss, they prefer he have a biopsy.   We can go through the appeals process, but I do not think they are  going to approve this.   We will do a 3 month follow up CT Chest, and re-evaluate at that time.   Please call the patient and let him know his health plan will not cover a PET scan at this time, and that I have ordered a 3 month CT chest to ensure there have been no changes. Have him call with any additional weight loss , or if he has blood in his sputum.  He will need a follow up with me 1-2 weeks after the scan has been done to review the results. Thanks so much

## 2024-03-16 NOTE — Telephone Encounter (Signed)
 See telephone note dated 03/16/2024

## 2024-03-20 IMAGING — US US SCROTUM W/ DOPPLER COMPLETE
1 series · 14 of 25 positions shown · non-contrast
Comparison: None.

CLINICAL DATA: Testicular pain

EXAM:
SCROTAL ULTRASOUND
DOPPLER ULTRASOUND OF THE TESTICLES
TECHNIQUE: Complete ultrasound examination of the testicles, epididymis, and
other scrotal structures was performed. Color and spectral Doppler
ultrasound were also utilized to evaluate blood flow to the
testicles.

[Series 1: us scrotum w/ doppler complete · 14 of 72 slices shown]
[im 1/72]
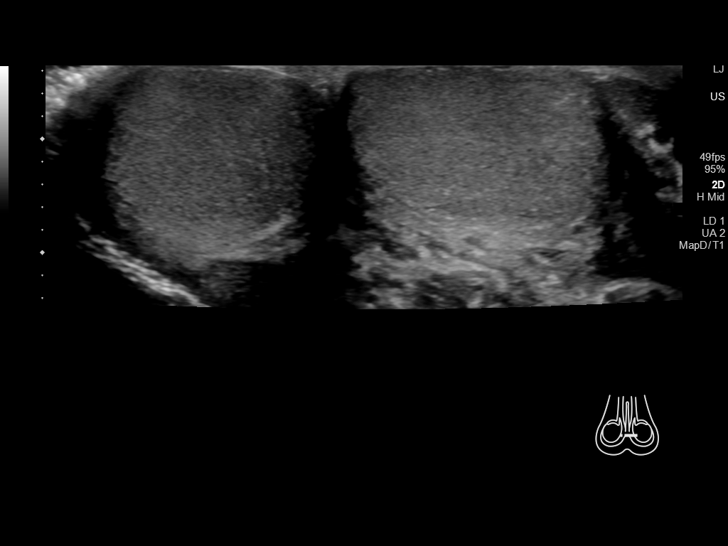
[im 6/72]
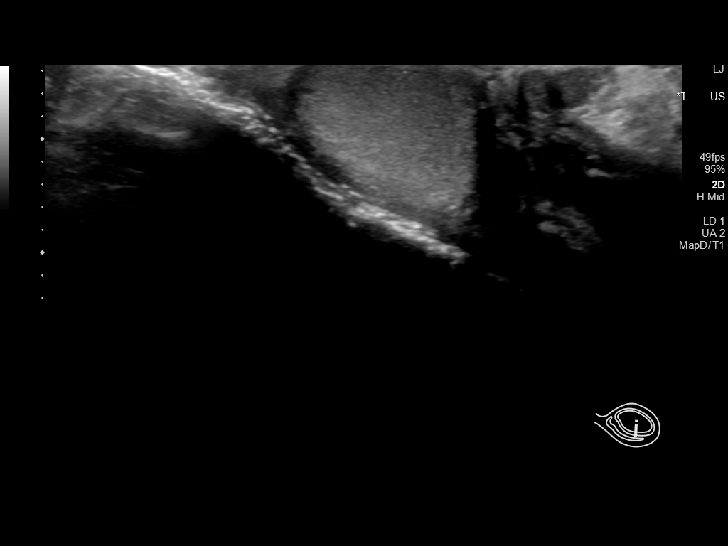
[im 12/72]
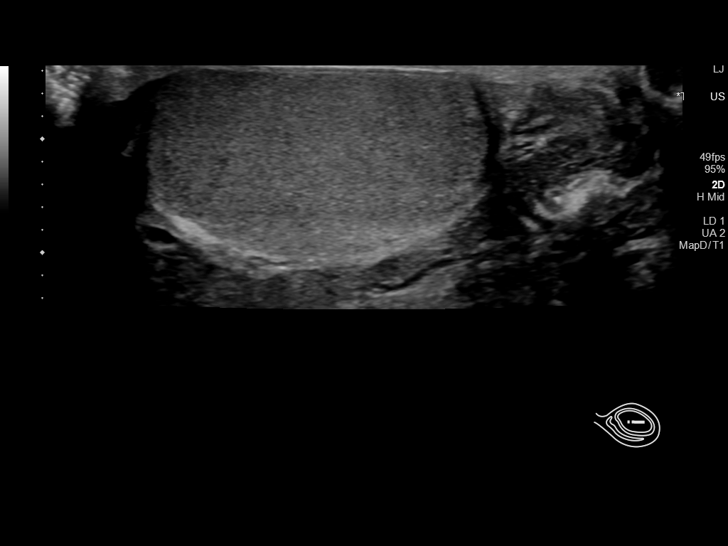
[im 18/72]
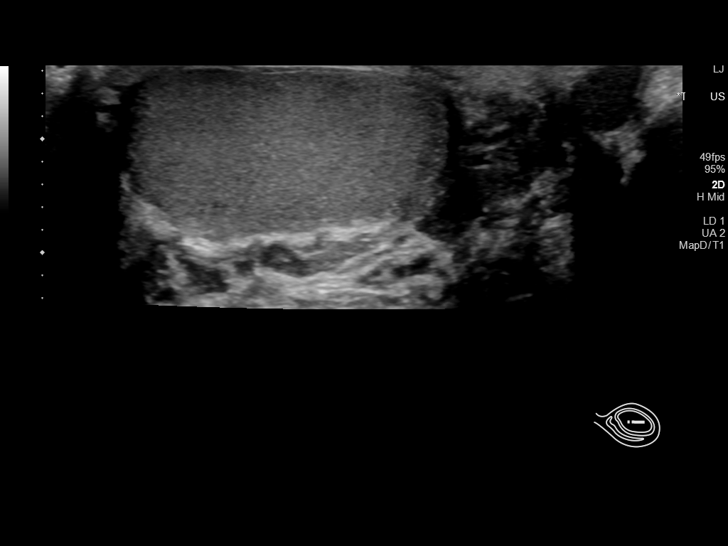
[im 24/72]
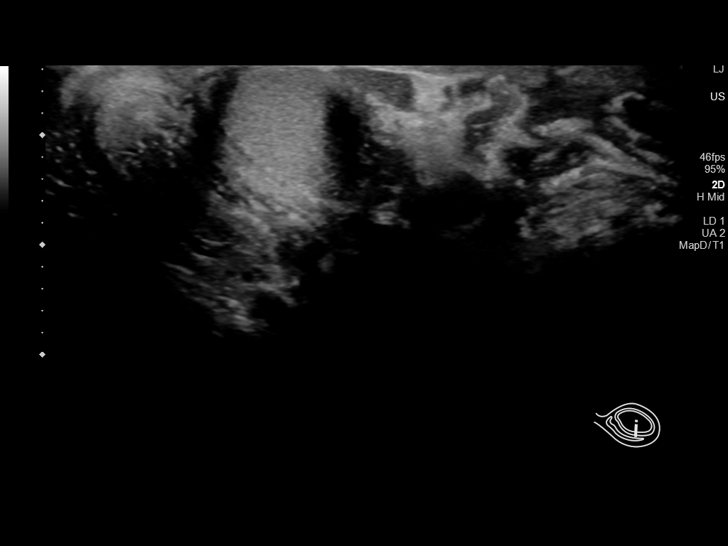
[im 27/72]
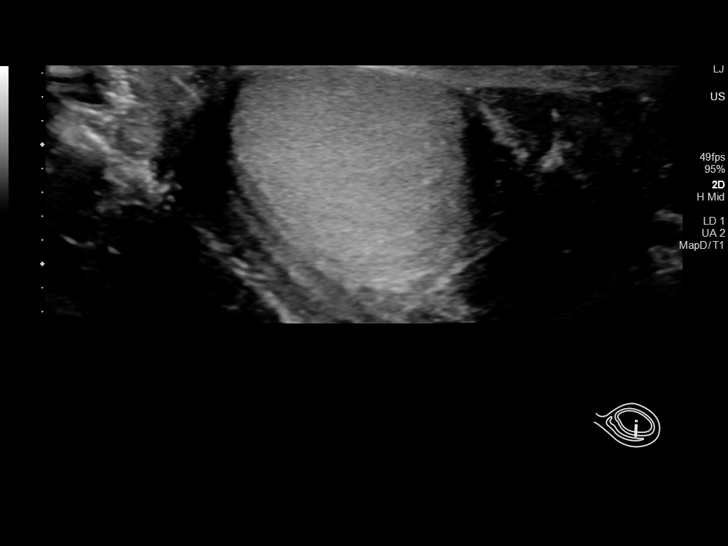
[im 33/72]
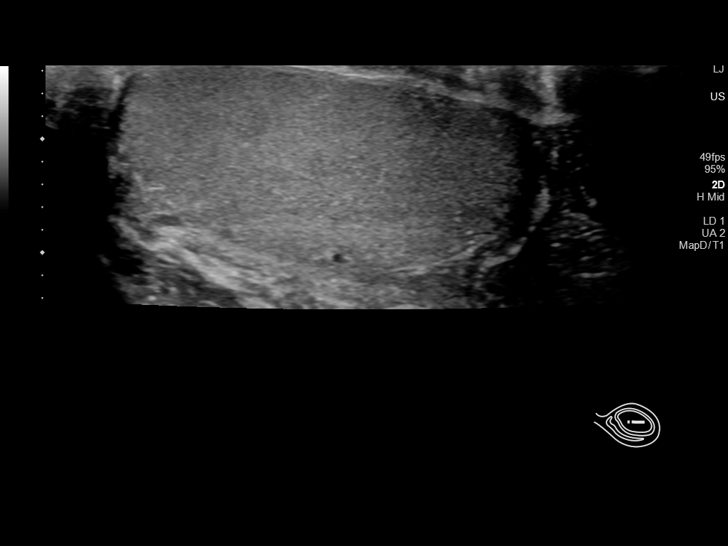
[im 39/72]
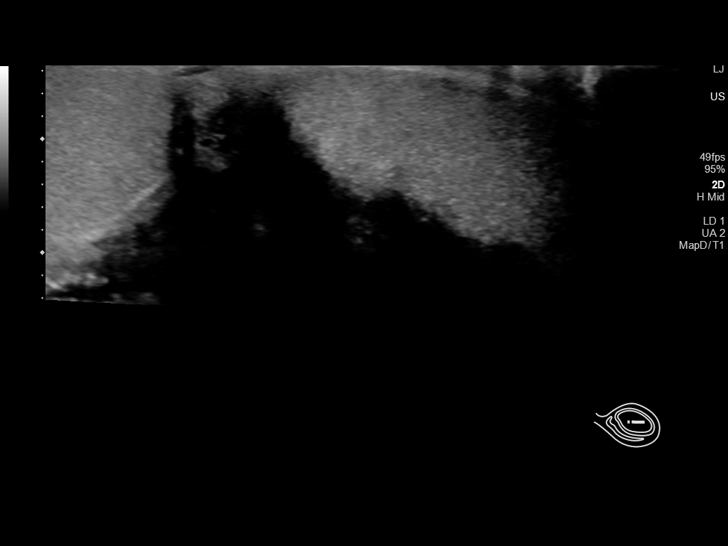
[im 45/72]
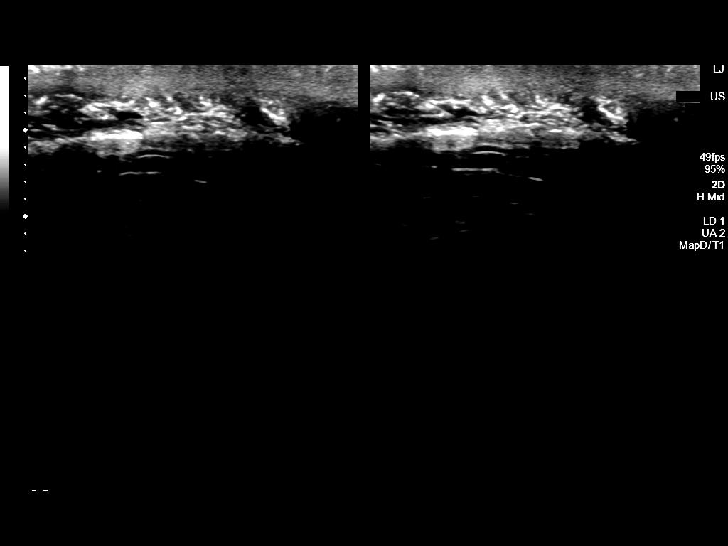
[im 48/72]
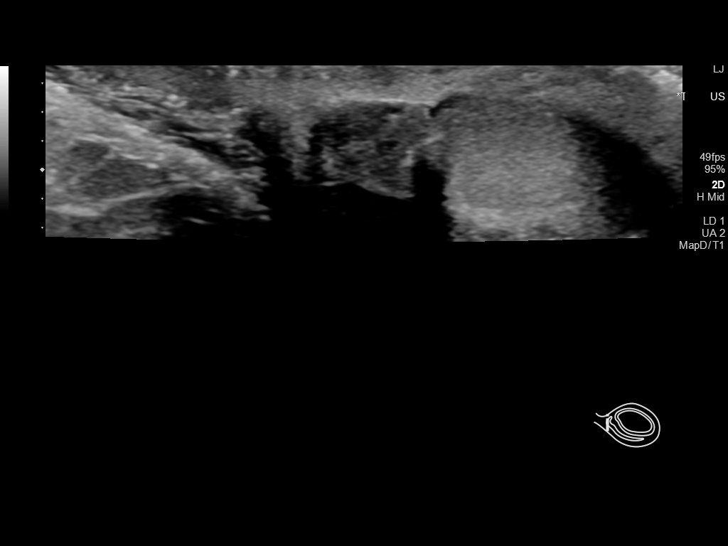
[im 54/72]
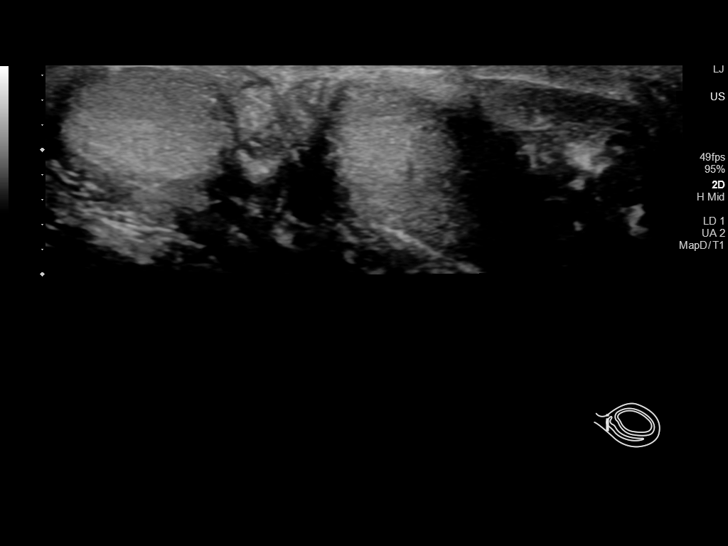
[im 60/72]
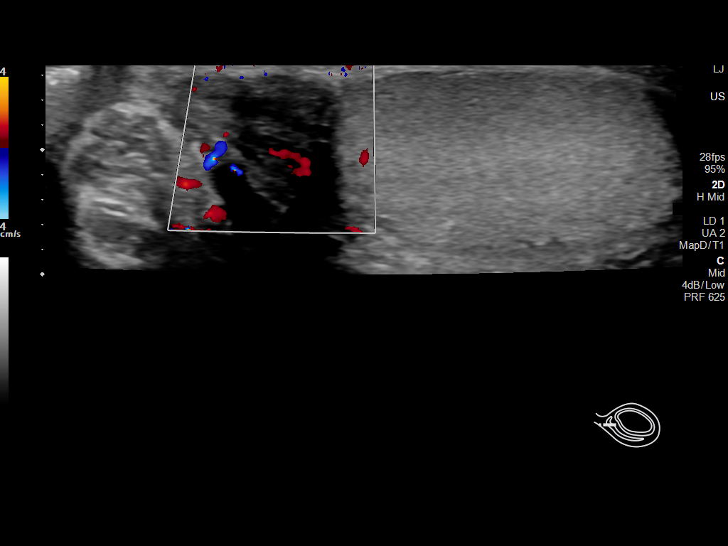
[im 66/72]
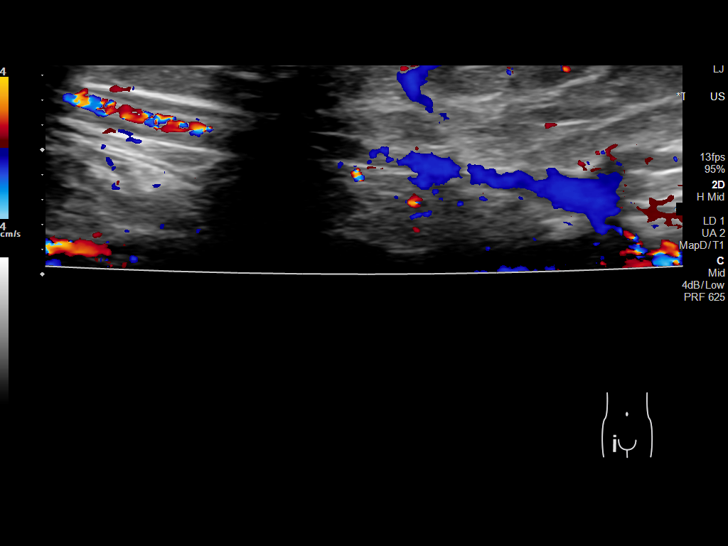
[im 72/72]
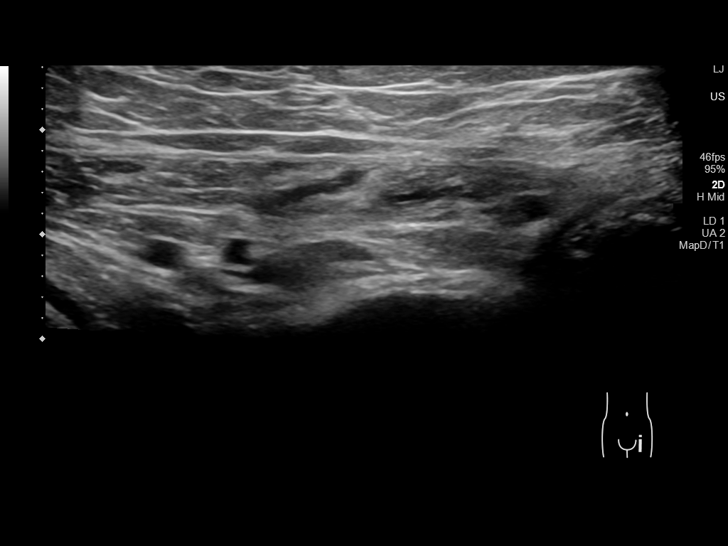

[14 of 25 positions shown; findings below may reference images not displayed]

FINDINGS: Right testicle

Measurements: 3.0 x 1.7 x 2.3 cm. No mass or microlithiasis
visualized.

Left testicle

Measurements: 3.7 x 1.7 x 2.2 cm. No mass or microlithiasis
visualized.

Right epididymis:  Normal in size and appearance.

Left epididymis:  Normal in size and appearance.

Hydrocele:  None visualized.

Varicocele:  None visualized.

Pulsed Doppler interrogation of both testes demonstrates normal low
resistance arterial and venous waveforms bilaterally.
IMPRESSION: Normal testicular sonogram. No evidence for testicular mass or
torsion.

## 2024-03-22 NOTE — Telephone Encounter (Signed)
 Patient has been scheduled for 06/16/24 at [facility]. LVDM about appointment and sending appointment letter.

## 2024-03-24 ENCOUNTER — Emergency Department (HOSPITAL_COMMUNITY)
Admission: EM | Admit: 2024-03-24 | Discharge: 2024-03-24 | Disposition: A | Discharge status: Home / Self Care | Payer: MEDICAID | Attending: Emergency Medicine | Admitting: Emergency Medicine

## 2024-03-24 ENCOUNTER — Emergency Department (HOSPITAL_COMMUNITY): Payer: MEDICAID

## 2024-03-24 DIAGNOSIS — I10 Essential (primary) hypertension: Secondary | ICD-10-CM | POA: Diagnosis not present

## 2024-03-24 DIAGNOSIS — R1084 Generalized abdominal pain: Secondary | ICD-10-CM | POA: Diagnosis present

## 2024-03-24 DIAGNOSIS — Z79899 Other long term (current) drug therapy: Secondary | ICD-10-CM | POA: Insufficient documentation

## 2024-03-24 DIAGNOSIS — K859 Acute pancreatitis without necrosis or infection, unspecified: Secondary | ICD-10-CM | POA: Insufficient documentation

## 2024-03-24 DIAGNOSIS — I2510 Atherosclerotic heart disease of native coronary artery without angina pectoris: Secondary | ICD-10-CM | POA: Insufficient documentation

## 2024-03-24 LAB — LIPASE, BLOOD: Lipase: 23 U/L (ref 11–51)

## 2024-03-24 LAB — COMPREHENSIVE METABOLIC PANEL WITH GFR
ALT: 155 U/L — ABNORMAL HIGH (ref 0–44)
AST: 111 U/L — ABNORMAL HIGH (ref 15–41)
Albumin: 3.9 g/dL (ref 3.5–5.0)
Alkaline Phosphatase: 58 U/L (ref 38–126)
Anion gap: 18 — ABNORMAL HIGH (ref 5–15)
BUN: <5 mg/dL — ABNORMAL LOW (ref 6–20)
CO2: 22 mmol/L (ref 22–32)
Calcium: 9.4 mg/dL (ref 8.9–10.3)
Chloride: 92 mmol/L — ABNORMAL LOW (ref 98–111)
Creatinine, Ser: 0.76 mg/dL (ref 0.61–1.24)
GFR, Estimated: >60 mL/min (ref >60)
Glucose, Bld: 94 mg/dL (ref 70–99)
Potassium: 3 mmol/L — ABNORMAL LOW (ref 3.5–5.1)
Sodium: 133 mmol/L — ABNORMAL LOW (ref 135–145)
Total Bilirubin: 0.8 mg/dL (ref 0.0–1.2)
Total Protein: 7.4 g/dL (ref 6.5–8.1)

## 2024-03-24 LAB — URINALYSIS, ROUTINE W REFLEX MICROSCOPIC
Bilirubin Urine: NEGATIVE
Glucose, UA: NEGATIVE mg/dL
Hgb urine dipstick: NEGATIVE
Ketones, ur: 5 mg/dL — AB
Leukocytes,Ua: NEGATIVE
Nitrite: NEGATIVE
Protein, ur: NEGATIVE mg/dL
Specific Gravity, Urine: 1.005 (ref 1.005–1.030)
pH: 6 (ref 5.0–8.0)

## 2024-03-24 LAB — CBC
HCT: 42.4 % (ref 39.0–52.0)
Hemoglobin: 14.4 g/dL (ref 13.0–17.0)
MCH: 31.1 pg (ref 26.0–34.0)
MCHC: 34 g/dL (ref 30.0–36.0)
MCV: 91.6 fL (ref 80.0–100.0)
Platelets: 158 K/uL (ref 150–400)
RBC: 4.63 MIL/uL (ref 4.22–5.81)
RDW: 13.6 % (ref 11.5–15.5)
WBC: 5.8 K/uL (ref 4.0–10.5)
nRBC: 0 % (ref 0.0–0.2)

## 2024-03-24 LAB — TYPE AND SCREEN
ABO/RH(D): O POS
Antibody Screen: NEGATIVE

## 2024-03-24 MED ORDER — IOHEXOL 300 MG/ML  SOLN
100.0000 mL | Freq: Once | INTRAMUSCULAR | Status: AC | PRN
  Administered 2024-03-24: 100 mL via INTRAVENOUS

## 2024-03-24 MED ORDER — SODIUM CHLORIDE 0.9 % IV BOLUS
1000.0000 mL | Freq: Once | INTRAVENOUS | Status: AC
  Administered 2024-03-24: 1000 mL via INTRAVENOUS

## 2024-03-24 MED ORDER — ONDANSETRON 4 MG PO TBDP
4.0000 mg | ORAL_TABLET | Freq: Three times a day (TID) | ORAL | 0 refills | Status: AC | PRN
Start: 2024-03-24 — End: ?

## 2024-03-24 MED ORDER — ONDANSETRON HCL 4 MG/2ML IJ SOLN
4.0000 mg | Freq: Once | INTRAMUSCULAR | Status: AC
  Administered 2024-03-24: 4 mg via INTRAVENOUS
  Filled 2024-03-24: qty 2

## 2024-03-24 MED ORDER — POTASSIUM CHLORIDE 10 MEQ/100ML IV SOLN
10.0000 meq | INTRAVENOUS | Status: AC
  Administered 2024-03-24: 10 meq via INTRAVENOUS
  Filled 2024-03-24 (×2): qty 100

## 2024-03-24 MED ORDER — IOHEXOL 300 MG/ML  SOLN
75.0000 mL | Freq: Once | INTRAMUSCULAR | Status: AC | PRN
  Administered 2024-03-24: 75 mL via INTRAVENOUS

## 2024-03-24 MED ORDER — POTASSIUM CHLORIDE CRYS ER 20 MEQ PO TBCR: 40.0000 meq | EXTENDED_RELEASE_TABLET | Freq: Two times a day (BID) | ORAL | 0 refills | Status: AC

## 2024-03-24 MED ORDER — FENTANYL CITRATE PF 50 MCG/ML IJ SOSY
50.0000 ug | PREFILLED_SYRINGE | Freq: Once | INTRAMUSCULAR | Status: AC
  Administered 2024-03-24: 50 ug via INTRAVENOUS
  Filled 2024-03-24: qty 1

## 2024-03-24 MED ORDER — OXYCODONE-ACETAMINOPHEN 5-325 MG PO TABS: 1.0000 | ORAL_TABLET | Freq: Four times a day (QID) | ORAL | 0 refills | Status: AC | PRN

## 2024-03-24 NOTE — ED Provider Notes (Signed)
 [place] EMERGENCY DEPARTMENT AT [facility] Provider Note   CSN: [id number] Arrival date & time: 03/24/24  1007     Patient presents with: Abdominal Pain and Rectal Bleeding   [name] is a 43 y.o. adult past medical history significant for hypertension, CAD, pancreatitis, substance abuse, and schizophrenia presents today for abdominal burning with nausea, vomiting, and black stools x 2 days.  Patient reports they are taking Pepto-Bismol for their symptoms.    Abdominal Pain Associated symptoms: hematochezia, nausea and vomiting   Rectal Bleeding Associated symptoms: abdominal pain and vomiting        Prior to Admission medications   Medication Sig Start Date End Date Taking? Authorizing Provider  ondansetron  (ZOFRAN -ODT) 4 MG disintegrating tablet Take 1 tablet (4 mg total) by mouth every 8 (eight) hours as needed for nausea or vomiting. 03/24/24  Yes Roxy Filler N, PA-C  oxyCODONE -acetaminophen  (PERCOCET/ROXICET) 5-325 MG tablet Take 1 tablet by mouth every 6 (six) hours as needed for severe pain (pain score 7-10). 03/24/24  Yes Maddox Hlavaty N, PA-C  potassium chloride  SA (KLOR-CON  M) 20 MEQ tablet Take 2 tablets (40 mEq total) by mouth 2 (two) times daily for 4 days. 03/24/24 03/28/24 Yes Rie Mcneil N, PA-C  amLODipine  (NORVASC ) 5 MG tablet TAKE 1 TABLET BY MOUTH DAILY 02/14/24   [name], Prince, MD  atorvastatin  (LIPITOR ) 40 MG tablet TAKE 1 TABLET BY MOUTH DAILY 02/14/24   [name], Prince, MD  busPIRone  (BUSPAR ) 15 MG tablet Take 15 mg by mouth See admin instructions. Take 15 mg by mouth in the morning, evening, and at bedtime    [provider]  gabapentin  (NEURONTIN ) 600 MG tablet Take 600 mg by mouth in the morning, at noon, and at bedtime.    [provider]  hydrOXYzine  (VISTARIL ) 50 MG capsule Take 50 mg by mouth 3 (three) times daily as needed for anxiety.    [provider]  lamoTRIgine  (LAMICTAL ) 100 MG tablet Take 50 mg by mouth in  the morning and at bedtime.    [provider]  Multiple Vitamins-Minerals (PRESERVISION AREDS 2) CAPS Take 1 capsule by mouth in the morning and at bedtime.    [provider]  ondansetron  (ZOFRAN -ODT) 4 MG disintegrating tablet Take 1 tablet (4 mg total) by mouth every 8 (eight) hours as needed for nausea or vomiting. 02/27/24   Barrett, Jamie N, PA-C  risperiDONE  (RISPERDAL ) 1 MG tablet Take 1 mg by mouth at bedtime.    [provider]  traZODone  (DESYREL ) 150 MG tablet Take 300 mg by mouth at bedtime.    [provider]    Allergies: Codeine, Penicillins, Morphine , Coconut (cocos nucifera), and Grapefruit concentrate    Review of Systems  Gastrointestinal:  Positive for abdominal pain, hematochezia, nausea and vomiting.    Updated Vital Signs BP (!) 134/97   Pulse (!) 105   Temp 98.3 F (36.8 C) (Oral)   Resp 17   Ht 5' 6 (1.676 m)   Wt 70.3 kg   SpO2 97%   BMI 25.02 kg/m   Physical Exam Vitals and nursing note reviewed.  Constitutional:      General: He is not in acute distress.    Appearance: He is well-developed.  HENT:     Head: Normocephalic and atraumatic.     Mouth/Throat:     Mouth: Mucous membranes are moist.     Pharynx: Oropharynx is clear.  Eyes:     Conjunctiva/sclera: Conjunctivae normal.  Cardiovascular:     Rate and Rhythm: Normal rate and regular rhythm.     Pulses: Normal pulses.     Heart sounds: Normal heart sounds. No murmur heard. Pulmonary:     Effort: Pulmonary effort is normal. No respiratory distress.  Abdominal:     General: There is no distension.     Palpations: Abdomen is soft.     Tenderness: There is generalized abdominal tenderness. There is no guarding. Negative signs include Murphy's sign, Rovsing's sign and McBurney's sign.  Musculoskeletal:        General: No swelling.     Cervical back: Neck supple.  Skin:    General: Skin is warm and dry.     Capillary Refill: Capillary refill takes less  than 2 seconds.  Neurological:     General: No focal deficit present.     Mental Status: He is alert and oriented to person, place, and time.  Psychiatric:        Mood and Affect: Mood normal.     (all labs ordered are listed, but only abnormal results are displayed) Labs Reviewed  COMPREHENSIVE METABOLIC PANEL WITH GFR - Abnormal; Notable for the following components:      Result Value   Sodium 133 (*)    Potassium 3.0 (*)    Chloride 92 (*)    BUN <5 (*)    AST 111 (*)    ALT 155 (*)    Anion gap 18 (*)    All other components within normal limits  LIPASE, BLOOD  CBC  URINALYSIS, ROUTINE W REFLEX MICROSCOPIC  TYPE AND SCREEN    EKG: None  Radiology: CT ABDOMEN PELVIS W CONTRAST Result Date: 03/24/2024 CLINICAL DATA:  Acute generalized abdominal pain. EXAM: CT ABDOMEN AND PELVIS WITH CONTRAST TECHNIQUE: Multidetector CT imaging of the abdomen and pelvis was performed using the standard protocol following bolus administration of intravenous contrast. RADIATION DOSE REDUCTION: This exam was performed according to the departmental dose-optimization program which includes automated exposure control, adjustment of the mA and/or kV according to patient size and/or use of iterative reconstruction technique. CONTRAST:  OMNIPAQUE  IOHEXOL  300 MG/ML SOLN, 75mL OMNIPAQUE  IOHEXOL  300 MG/ML SOLN COMPARISON:  February 27, 2024. FINDINGS: Lower chest: Visualized lung bases are unremarkable. However, extensive soft tissue lobular abnormalities are noted in the visualized portions of both breast regions, right greater than left. Malignancy cannot be excluded. Mammographic evaluation is recommended. Hepatobiliary: Hepatic steatosis. No cholelithiasis or biliary dilatation. Pancreas: Inflammatory stranding is noted around pancreatic body suggesting possible pancreatitis. No pseudocyst formation is noted. Spleen: Normal in size without focal abnormality. Adrenals/Urinary Tract: Adrenal glands are  unremarkable. Kidneys are normal, without renal calculi, focal lesion, or hydronephrosis. Bladder is unremarkable. Stomach/Bowel: Stomach is within normal limits. Appendix appears normal. No evidence of bowel wall thickening, distention, or inflammatory changes. Vascular/Lymphatic: No significant vascular findings are present. No enlarged abdominal or pelvic lymph nodes. Reproductive: Prostate is unremarkable. Other: No ascites or hernia is noted. Musculoskeletal: No acute or significant osseous findings. IMPRESSION: 1. Extensive soft tissue lobular abnormalities are noted in the visualized portions of both breast regions, right greater than left. Malignancy cannot be excluded. Mammographic evaluation is recommended. 2. Hepatic steatosis. 3. Inflammatory stranding is noted around pancreatic body suggesting possible pancreatitis. Correlation with pancreatic enzymes is recommended. Electronically Signed   By: Lynwood Landy Raddle M.D.   On: 03/24/2024 13:36     Procedures   Medications Ordered in the ED  potassium chloride  10 mEq in  100 mL IVPB (10 mEq Intravenous New Bag/Given 03/24/24 1420)  fentaNYL  (SUBLIMAZE ) injection 50 mcg (50 mcg Intravenous Given 03/24/24 1422)  ondansetron  (ZOFRAN ) injection 4 mg (4 mg Intravenous Given 03/24/24 1421)  sodium chloride  0.9 % bolus 1,000 mL (1,000 mLs Intravenous New Bag/Given 03/24/24 1419)  iohexol  (OMNIPAQUE ) 300 MG/ML solution 100 mL (100 mLs Intravenous Contrast Given 03/24/24 1220)  iohexol  (OMNIPAQUE ) 300 MG/ML solution 75 mL (75 mLs Intravenous Contrast Given 03/24/24 1305)                                    Medical Decision Making Amount and/or Complexity of Data Reviewed Labs: ordered. Radiology: ordered.  Risk Prescription drug management.   This patient presents to the ED for concern of abdominal pain with nausea, vomiting, black stools differential diagnosis includes upper GI bleed, pancreatitis, choledocholithiasis, acute cholecystitis, appendicitis,  colitis, diverticulitis, anal fissure, hemorrhoid   Lab Tests:  I Ordered, and personally interpreted labs.  The pertinent results include: CBC WNL, mild hyponatremia at 133, hypokalemia 3.0, hypochloremia 92, decreased bun at less than 5, elevated anion gap at 18, elevated AST at 111, elevated ALT at 155, lipase 23   Imaging Studies ordered:  I ordered imaging studies including CT abdomen pelvis with contrast I independently visualized and interpreted imaging which showed extensive soft tissue lobular abnormalities are noted in the visualized portions of both breast regions, right greater than left.  Malignancy cannot be excluded.  Mammographic evaluation is recommended.  Hepatic steatosis.  Inflammatory stranding is noted around the pancreatic body suggesting possible pancreatitis.  Correlation with pancreatic enzymes is recommended. I agree with the radiologist interpretation   Medicines ordered and prescription drug management:  I ordered medication including IVF, potassium, fentanyl  and Zofran     I have reviewed the patients home medicines and have made adjustments as needed   Problem List / ED Course:  Upon reassessment patient states that they are feeling quite a bit better.  Patient does endorse heavy alcohol  consumption. Patient able to tolerate p.o. intake prior to discharge. Considered for admission or further workup however patient's vital signs, physical exam, labs, and imaging are reassuring.  Patient given outpatient antiemetic, analgesic, and potassium for presumed pancreatitis and hypokalemia.  Patient given return precautions.  I feel patient is safe for discharge at this time.     Final diagnoses:  Acute pancreatitis, unspecified complication status, unspecified pancreatitis type    ED Discharge Orders          Ordered    ondansetron  (ZOFRAN -ODT) 4 MG disintegrating tablet  Every 8 hours PRN        03/24/24 1452    potassium chloride  SA (KLOR-CON  M) 20 MEQ  tablet  2 times daily        03/24/24 1452    oxyCODONE -acetaminophen  (PERCOCET/ROXICET) 5-325 MG tablet  Every 6 hours PRN        03/24/24 1452               Francis Ileana SAILOR, PA-C 03/24/24 1453    Tegeler, Lonni PARAS, MD 03/24/24 1531

## 2024-03-24 NOTE — Discharge Instructions (Addendum)
 Today you were seen for abdominal pain with nausea and vomiting.  I believe this is likely from acute pancreatitis.  Please see the attached pancreatitis eating plan.  Please pick up your Zofran  and Percocet and take as needed for nausea and vomiting.  You were also found to have low potassium while in the emergency department and have been prescribed a short course of potassium tablets.  Please return to the ED if you have uncontrollable vomiting, fever that does not go down with Tylenol  or Motrin , or worsening pain.  Thank you for letting us  treat you today. After reviewing your labs and imaging, I feel you are safe to go home. Please follow up with your PCP in the next several days and provide them with your records from this visit. Return to the Emergency Room if pain becomes severe or symptoms worsen.

## 2024-03-24 NOTE — ED Triage Notes (Signed)
 Patient in today reporting abd pain - burning, with n/v with recent black stools for 2 days.

## 2024-03-24 NOTE — ED Notes (Signed)
 Pt in CT and waiting to see if they can complete the scan.  The CT is down and they are working on it at this time.

## 2024-03-28 ENCOUNTER — Other Ambulatory Visit (HOSPITAL_COMMUNITY): Payer: MEDICAID

## 2024-03-31 IMAGING — US US SCROTUM W/ DOPPLER COMPLETE
1 series · 14 of 25 positions shown · non-contrast
Comparison: October 17, 2021

CLINICAL DATA: Right-sided testicular pain x3 days.

EXAM:
SCROTAL ULTRASOUND
DOPPLER ULTRASOUND OF THE TESTICLES
TECHNIQUE: Complete ultrasound examination of the testicles, epididymis, and
other scrotal structures was performed. Color and spectral Doppler
ultrasound were also utilized to evaluate blood flow to the
testicles.

[Series 1: us scrotum w/doppler · 14 of 63 slices shown]
[im 1/63]
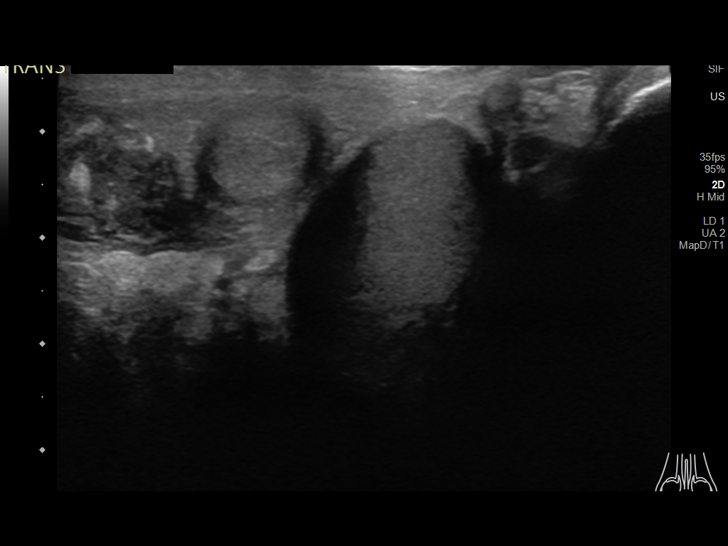
[im 6/63]
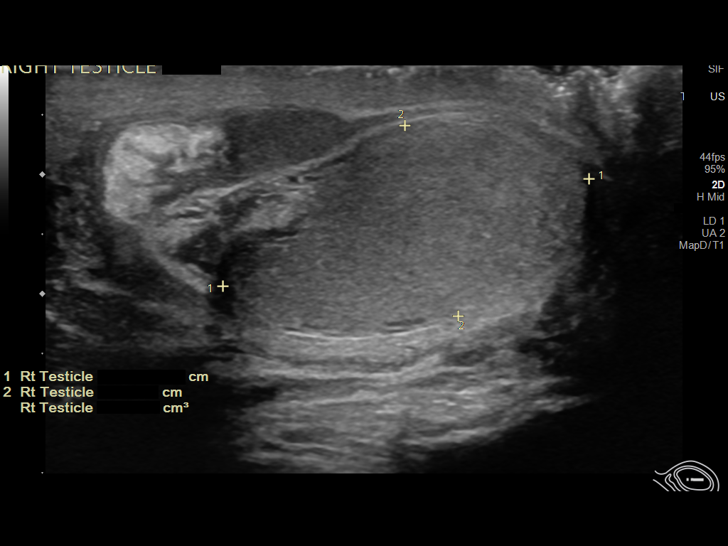
[im 11/63]
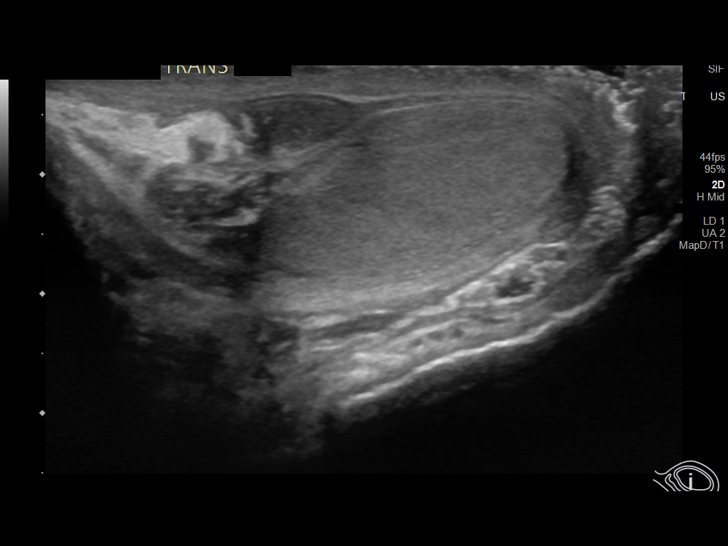
[im 16/63]
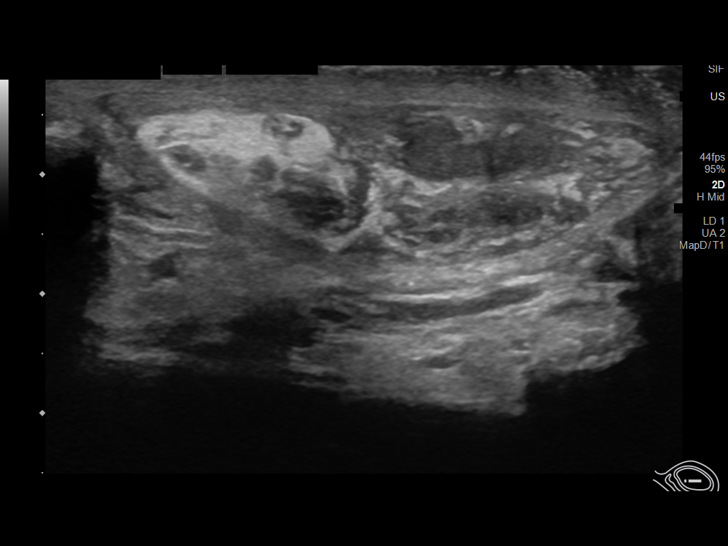
[im 21/63]
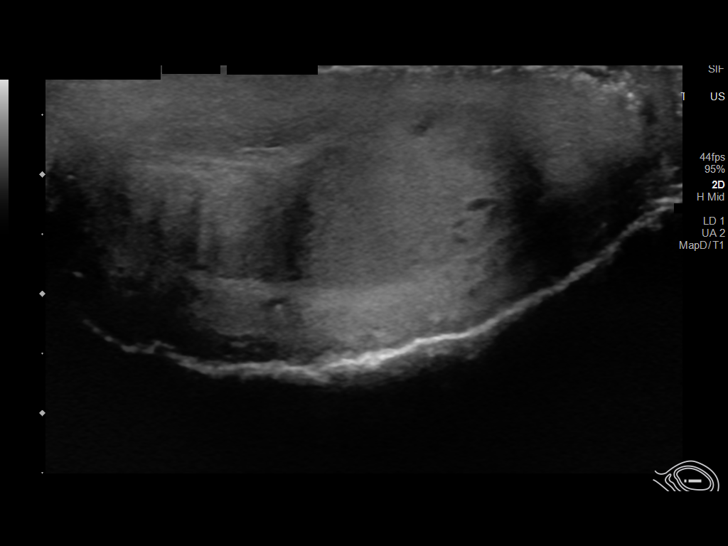
[im 24/63]
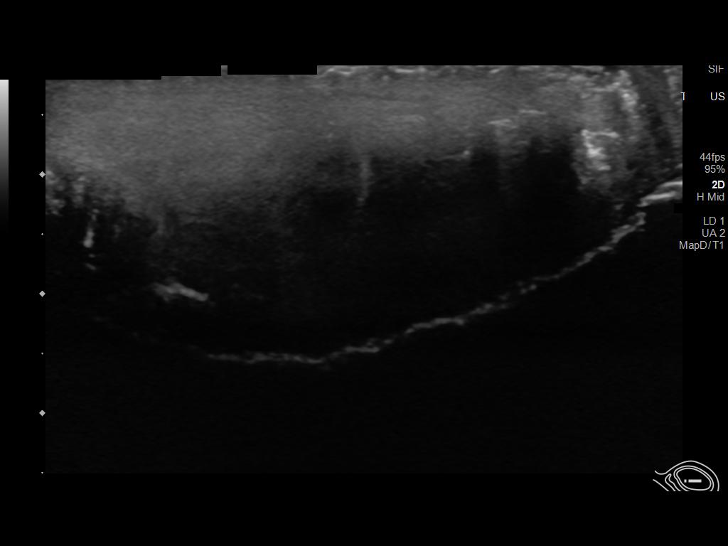
[im 29/63]
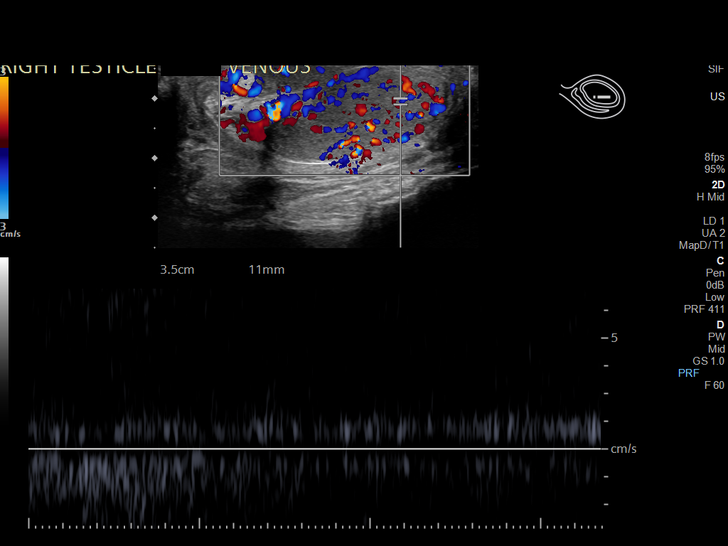
[im 34/63]
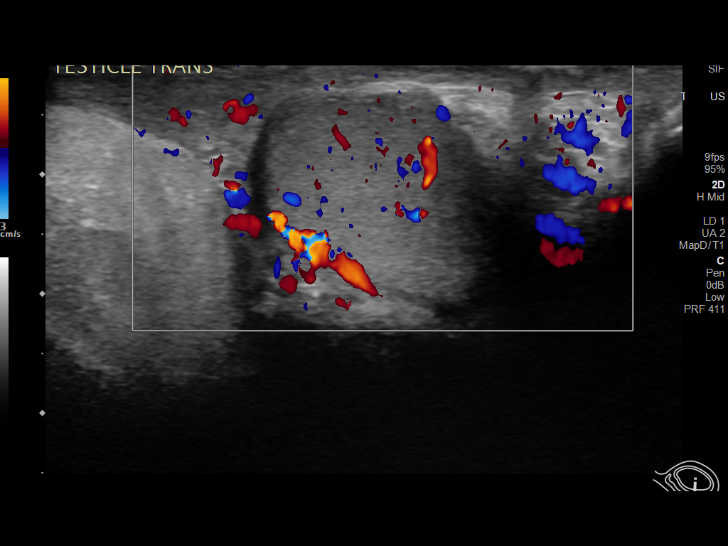
[im 39/63]
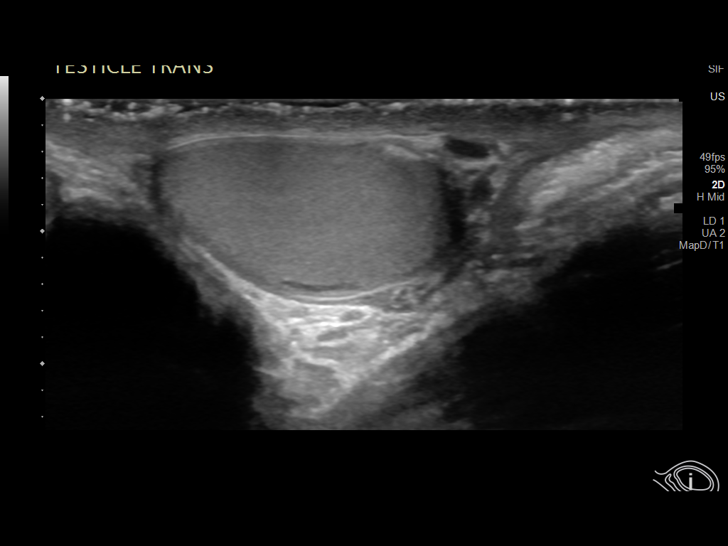
[im 42/63]
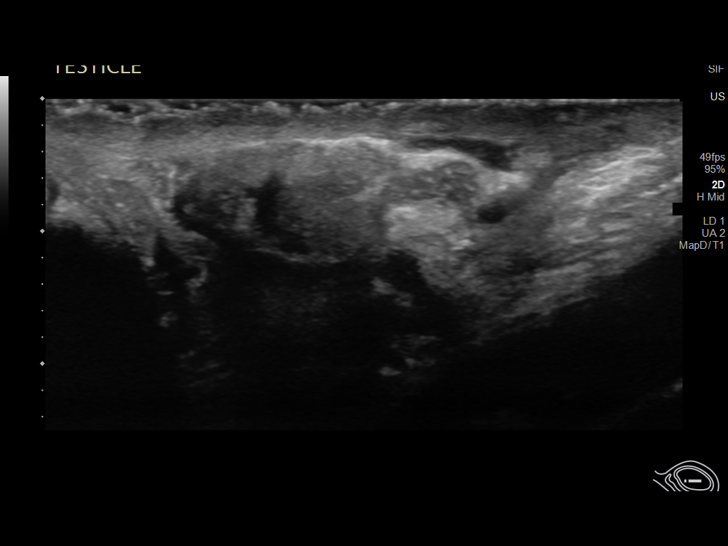
[im 47/63]
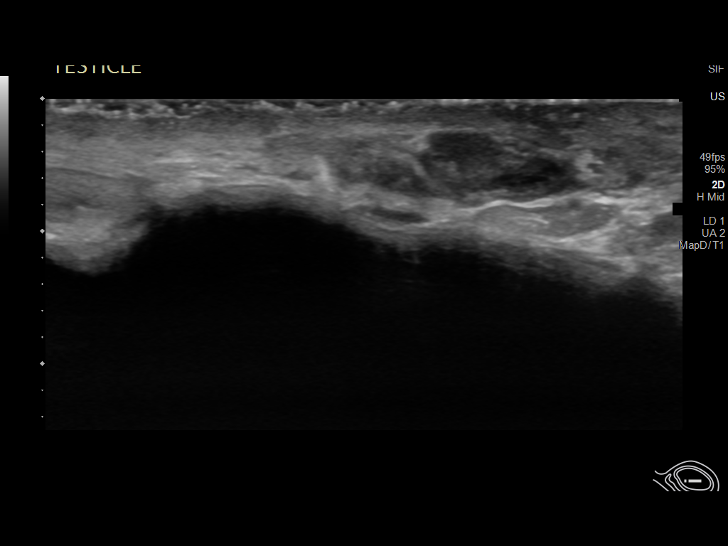
[im 52/63]
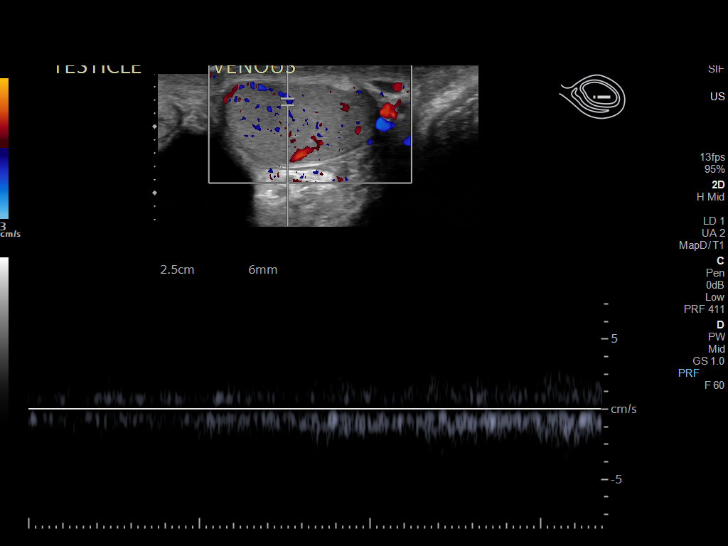
[im 57/63]
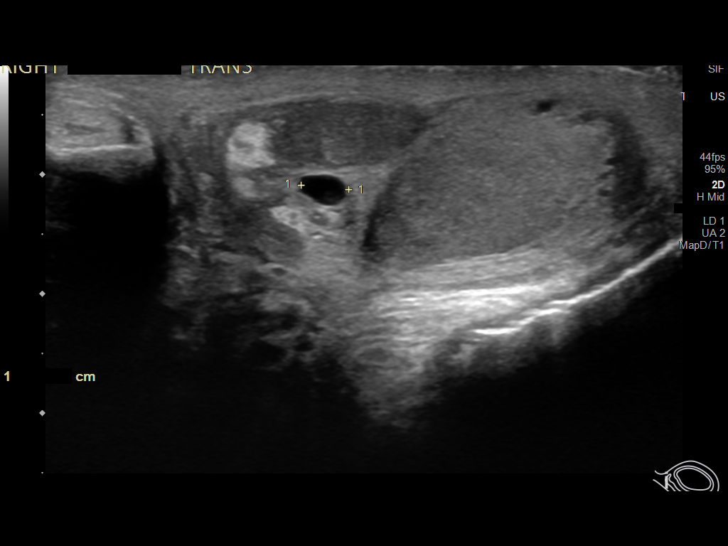
[im 63/63]
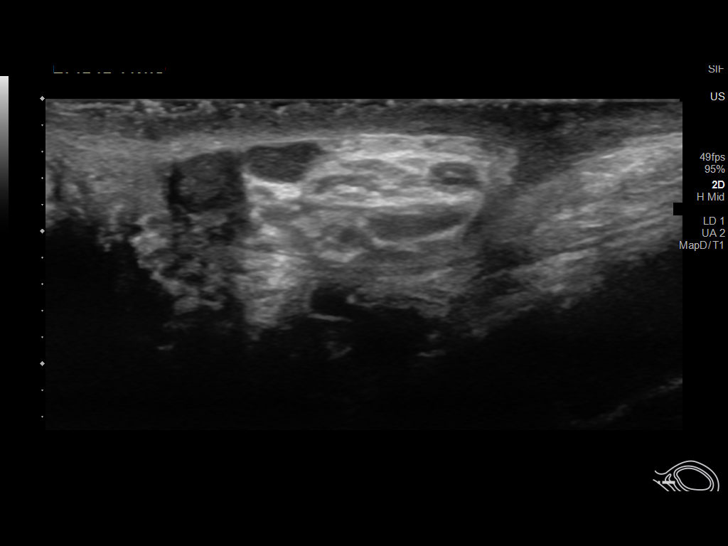

[14 of 25 positions shown; findings below may reference images not displayed]

FINDINGS: Right testicle

Measurements: 3.2 cm x 1.7 cm x 2.7 cm. No mass or microlithiasis
visualized. Increased flow is noted throughout the right testicle on
color Doppler evaluation.

Left testicle

Measurements: 2.5 cm x 1.3 cm x 2.1 cm. No mass or microlithiasis
visualized.

Right epididymis: The right epididymis is enlarged and heterogeneous
in appearance, with diffusely increased flow seen on color Doppler
evaluation.

Left epididymis:  Normal in size and appearance.

Hydrocele:  None visualized.

Varicocele:  None visualized.

Pulsed Doppler interrogation of both testes demonstrates normal low
resistance arterial and venous waveforms bilaterally.
IMPRESSION: Findings consistent with right-sided epididymo-orchitis.

## 2024-03-31 IMAGING — CR DG CHEST 2V
2 series · 2 of 2 positions shown · non-contrast
Comparison: Chest two views 09/17/2021

CLINICAL DATA: Cough.  Fever.

EXAM:
CHEST - 2 VIEW

[chest pa]
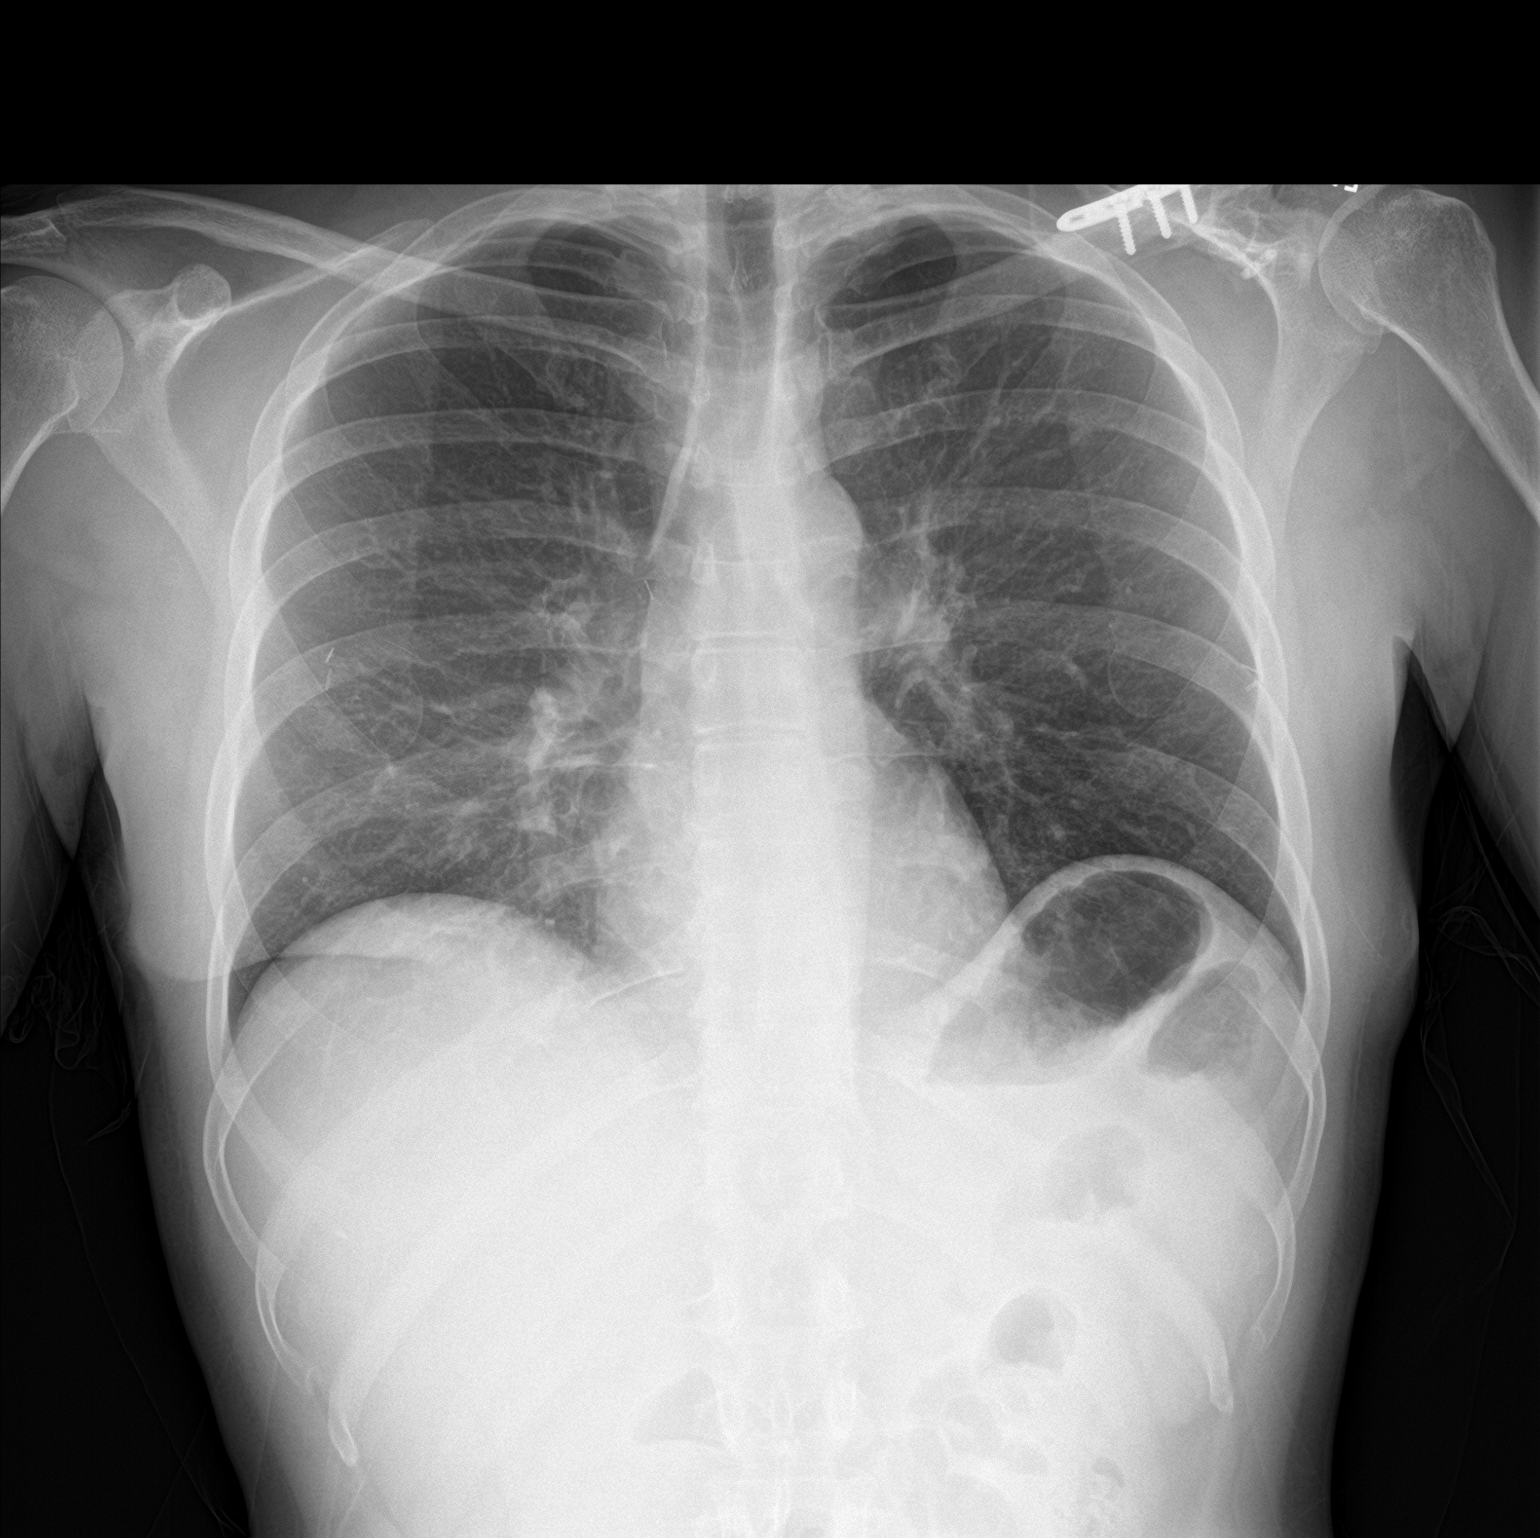

[chest lat]
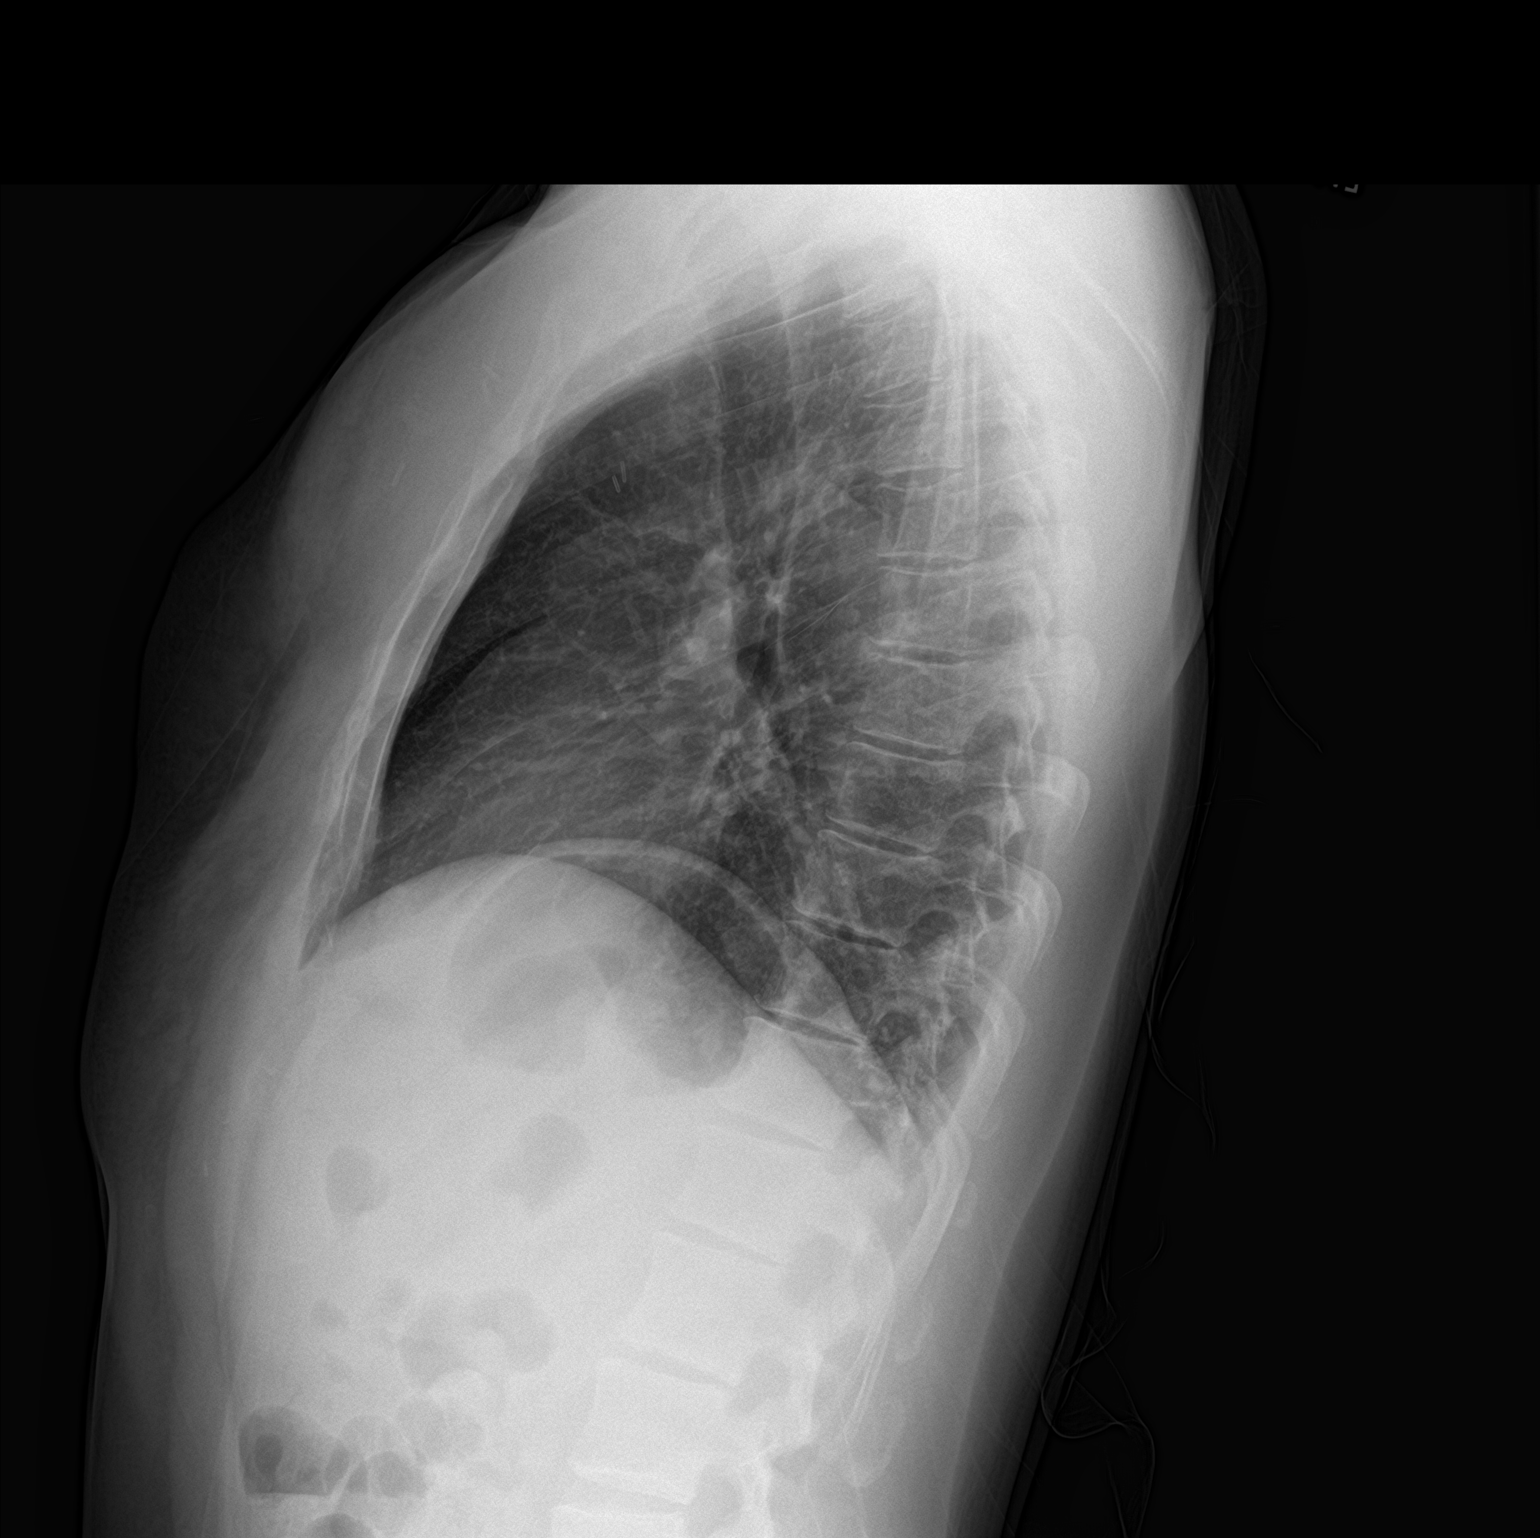

[2 of 2 positions shown; findings below may reference images not displayed]

FINDINGS: Cardiac silhouette and mediastinal contours are within normal
limits. The lungs are clear. Surgical clips again overlie the
lateral right midlung on frontal view, appearing to be within the
anterior and anterolateral soft tissues on lateral view. No pleural
effusion or pneumothorax. Redemonstration of plate and screw
fixation of the mid and lateral clavicle with coracoclavicular
ligament repair.
IMPRESSION: No active cardiopulmonary disease.

## 2024-04-04 ENCOUNTER — Ambulatory Visit: Payer: MEDICAID | Admitting: Acute Care

## 2024-05-16 IMAGING — DX DG CHEST 1V PORT
1 series · 1 of 1 positions shown · non-contrast
Comparison: Chest two views 10/28/2021

CLINICAL DATA: Trazodone overdose.

EXAM:
PORTABLE CHEST 1 VIEW

[chest ap]
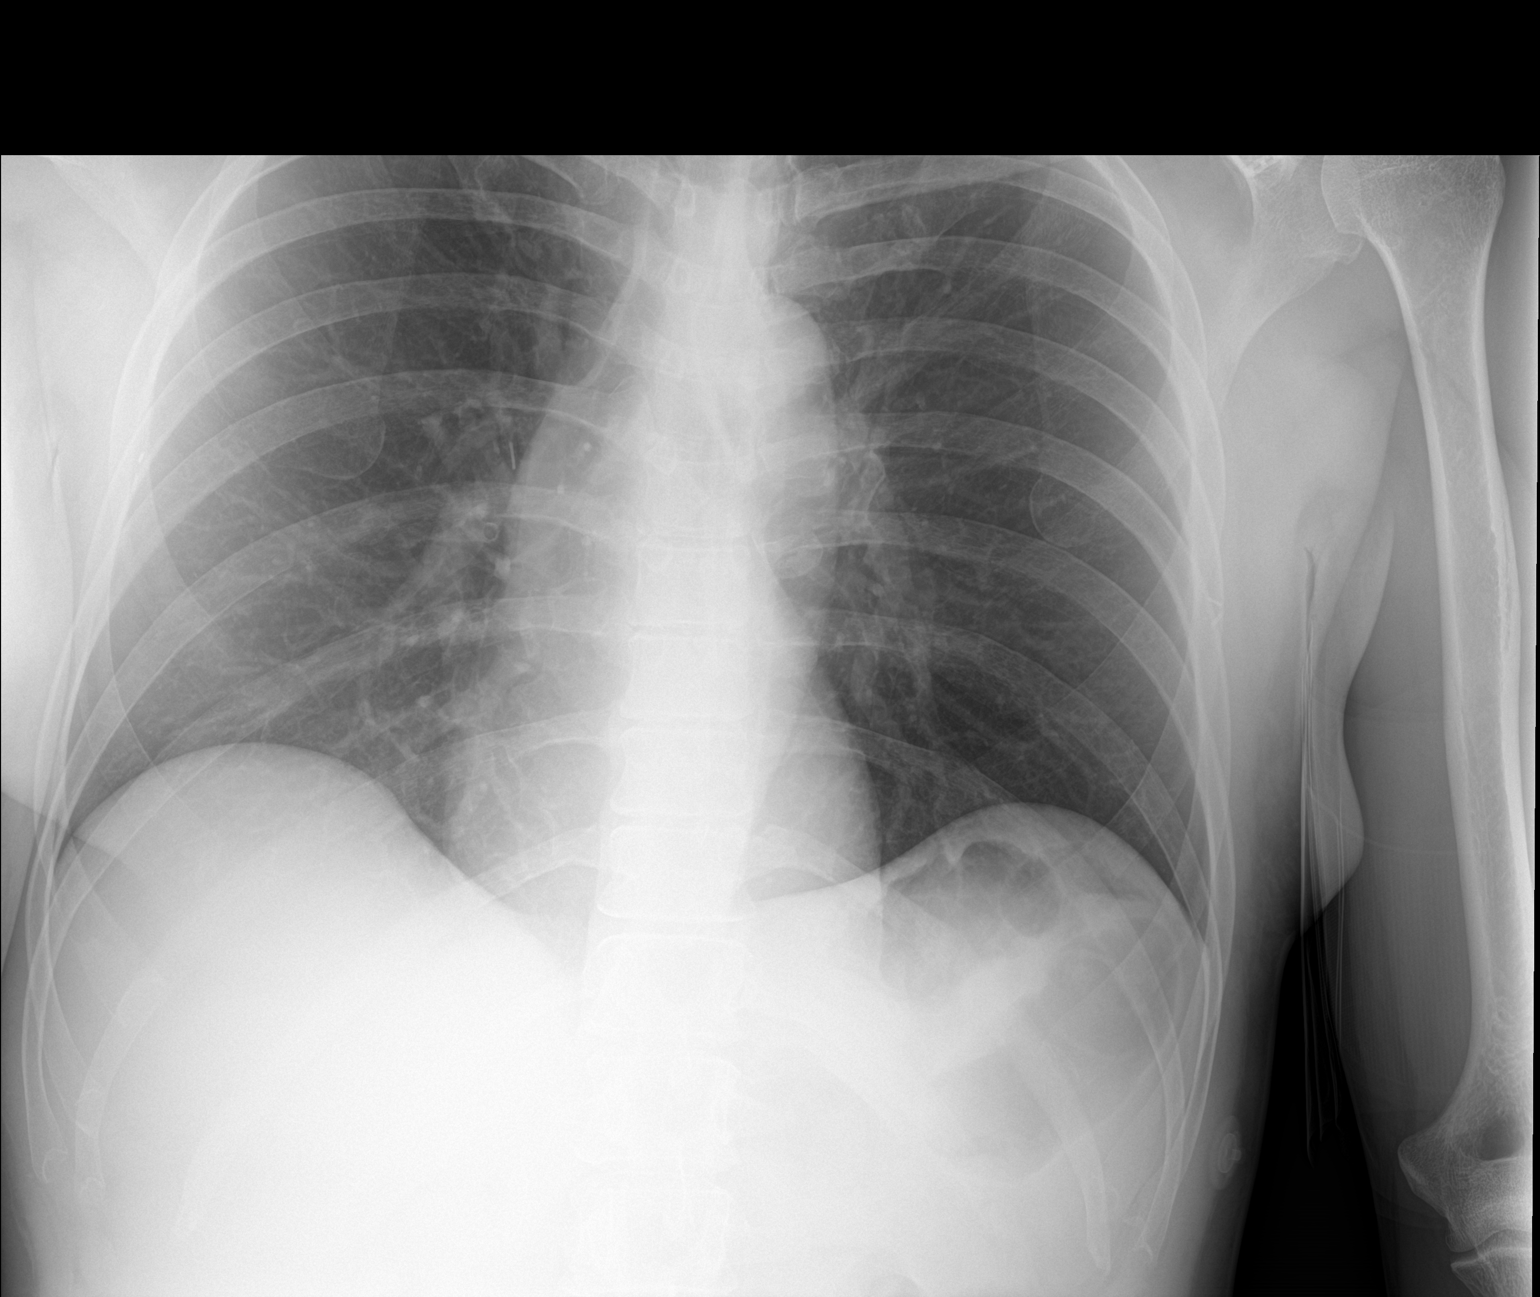

[1 of 1 positions shown; findings below may reference images not displayed]

FINDINGS: Cardiac silhouette and mediastinal contours are within normal
limits. A surgical clip again overlies the right hilum. The lungs
are clear. No pleural effusion or pneumothorax. Superior approach
plate and screw fixation of the lateral left clavicle with
coracoclavicular ligament repair, similar to prior.
IMPRESSION: No active disease.

## 2024-05-21 ENCOUNTER — Encounter: Payer: Self-pay | Admitting: Radiology

## 2024-06-07 ENCOUNTER — Encounter: Payer: Self-pay | Admitting: Oncology

## 2024-06-12 ENCOUNTER — Ambulatory Visit: Payer: MEDICAID | Admitting: Acute Care

## 2024-06-13 ENCOUNTER — Other Ambulatory Visit: Payer: Self-pay | Admitting: Student

## 2024-06-13 DIAGNOSIS — I10 Essential (primary) hypertension: Secondary | ICD-10-CM

## 2024-06-16 ENCOUNTER — Ambulatory Visit (HOSPITAL_BASED_OUTPATIENT_CLINIC_OR_DEPARTMENT_OTHER)
Admission: RE | Admit: 2024-06-16 | Discharge: 2024-06-16 | Disposition: A | Payer: MEDICAID | Source: Ambulatory Visit | Attending: Acute Care | Admitting: Acute Care

## 2024-06-16 DIAGNOSIS — R918 Other nonspecific abnormal finding of lung field: Secondary | ICD-10-CM | POA: Diagnosis present

## 2024-06-18 NOTE — Telephone Encounter (Signed)
 Medication sent to pharmacy

## 2024-06-26 ENCOUNTER — Ambulatory Visit: Payer: MEDICAID | Admitting: Acute Care

## 2024-07-20 ENCOUNTER — Encounter: Payer: Self-pay | Admitting: Acute Care

## 2024-07-20 ENCOUNTER — Ambulatory Visit: Payer: MEDICAID | Admitting: Acute Care

## 2024-07-20 VITALS — BP 121/85 | HR 120 | Temp 98.6°F | Ht 66.0 in | Wt 140.0 lb

## 2024-07-20 DIAGNOSIS — R911 Solitary pulmonary nodule: Secondary | ICD-10-CM | POA: Diagnosis not present

## 2024-07-20 DIAGNOSIS — R918 Other nonspecific abnormal finding of lung field: Secondary | ICD-10-CM | POA: Diagnosis not present

## 2024-07-20 DIAGNOSIS — F17291 Nicotine dependence, other tobacco product, in remission: Secondary | ICD-10-CM | POA: Diagnosis not present

## 2024-07-20 DIAGNOSIS — Z85528 Personal history of other malignant neoplasm of kidney: Secondary | ICD-10-CM

## 2024-07-20 DIAGNOSIS — Z853 Personal history of malignant neoplasm of breast: Secondary | ICD-10-CM | POA: Diagnosis not present

## 2024-07-20 DIAGNOSIS — R9389 Abnormal findings on diagnostic imaging of other specified body structures: Secondary | ICD-10-CM

## 2024-07-20 DIAGNOSIS — Z720 Tobacco use: Secondary | ICD-10-CM

## 2024-07-20 DIAGNOSIS — F17210 Nicotine dependence, cigarettes, uncomplicated: Secondary | ICD-10-CM | POA: Diagnosis not present

## 2024-07-20 NOTE — Patient Instructions (Signed)
 It is good to see you today. You CT Chest shows the soft tissue nodularity along the chest wall and abdomen is stable. We will do a 3 month follow up scan to maintain surveillance. This will be due 08/2024.  You will get a call closer to the time to get the scan scheduled.   Please call to be seen sooner if you have any unexplained weight loss or blood when you cough.  Work on quitting smoking/ vaping.  Please contact office for sooner follow up if symptoms do not improve or worsen or seek emergency care
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
## 2024-07-25 ENCOUNTER — Encounter: Payer: Self-pay | Admitting: Student

## 2024-07-25 ENCOUNTER — Ambulatory Visit: Payer: MEDICAID | Admitting: Student

## 2024-07-25 ENCOUNTER — Other Ambulatory Visit: Payer: Self-pay

## 2024-07-25 VITALS — BP 110/82 | HR 119 | Temp 97.6°F | Ht 66.0 in | Wt 139.4 lb

## 2024-07-25 DIAGNOSIS — R000 Tachycardia, unspecified: Secondary | ICD-10-CM

## 2024-07-25 DIAGNOSIS — E785 Hyperlipidemia, unspecified: Secondary | ICD-10-CM | POA: Diagnosis not present

## 2024-07-25 DIAGNOSIS — I10 Essential (primary) hypertension: Secondary | ICD-10-CM

## 2024-07-25 DIAGNOSIS — Z1211 Encounter for screening for malignant neoplasm of colon: Secondary | ICD-10-CM

## 2024-07-25 NOTE — Assessment & Plan Note (Signed)
 Recheck lipid panel today.  Has a history of CAD.  Nearest atorvastatin  40 - Continue atorvastatin  40
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
## 2024-07-25 NOTE — Assessment & Plan Note (Signed)
 Blood pressure appropriate in office.  Reports adherence to amlodipine  5. - Continue amlodipine  5 daily

## 2024-07-26 ENCOUNTER — Ambulatory Visit: Payer: Self-pay | Admitting: Student

## 2024-07-26 ENCOUNTER — Other Ambulatory Visit: Payer: Self-pay | Admitting: Student

## 2024-07-26 LAB — CBC
Hematocrit: 41.6 % (ref 37.5–51.0)
Hemoglobin: 14.3 g/dL (ref 13.0–17.7)
MCH: 33.3 pg — ABNORMAL HIGH (ref 26.6–33.0)
MCHC: 34.4 g/dL (ref 31.5–35.7)
MCV: 97 fL (ref 79–97)
Platelets: 152 x10E3/uL (ref 150–450)
RBC: 4.3 x10E6/uL (ref 4.14–5.80)
RDW: 13.9 % (ref 11.6–15.4)
WBC: 4.3 x10E3/uL (ref 3.4–10.8)

## 2024-07-26 LAB — LIPID PANEL
Chol/HDL Ratio: 3.9 ratio (ref 0.0–5.0)
Cholesterol, Total: 224 mg/dL — ABNORMAL HIGH (ref 100–199)
HDL: 58 mg/dL
LDL Chol Calc (NIH): 148 mg/dL — ABNORMAL HIGH (ref 0–99)
Triglycerides: 101 mg/dL (ref 0–149)
VLDL Cholesterol Cal: 18 mg/dL (ref 5–40)

## 2024-07-26 LAB — BASIC METABOLIC PANEL WITH GFR
BUN/Creatinine Ratio: 7 — ABNORMAL LOW (ref 9–20)
BUN: 5 mg/dL — ABNORMAL LOW (ref 6–24)
CO2: 20 mmol/L (ref 20–29)
Calcium: 9.4 mg/dL (ref 8.7–10.2)
Chloride: 96 mmol/L (ref 96–106)
Creatinine, Ser: 0.76 mg/dL (ref 0.76–1.27)
Glucose: 117 mg/dL — ABNORMAL HIGH (ref 70–99)
Potassium: 3.4 mmol/L — ABNORMAL LOW (ref 3.5–5.2)
Sodium: 134 mmol/L (ref 134–144)
eGFR: 113 mL/min/1.73

## 2024-07-26 LAB — TSH: TSH: 0.538 u[IU]/mL (ref 0.450–4.500)

## 2024-07-26 MED ORDER — ATORVASTATIN CALCIUM 80 MG PO TABS: 80.0000 mg | ORAL_TABLET | Freq: Every day | ORAL | 2 refills | Status: AC

## 2024-07-27 NOTE — Progress Notes (Signed)
 Internal Medicine Clinic Attending  Case discussed with the resident at the time of the visit.  We reviewed the resident's history and exam and pertinent patient test results.  I agree with the assessment, diagnosis, and plan of care documented in the resident's note.

## 2024-08-15 ENCOUNTER — Other Ambulatory Visit: Payer: Self-pay | Admitting: Student

## 2024-08-15 DIAGNOSIS — Z8719 Personal history of other diseases of the digestive system: Secondary | ICD-10-CM

## 2024-09-10 ENCOUNTER — Ambulatory Visit (HOSPITAL_BASED_OUTPATIENT_CLINIC_OR_DEPARTMENT_OTHER): Payer: MEDICAID

## 2024-10-16 ENCOUNTER — Ambulatory Visit: Payer: MEDICAID | Admitting: Acute Care

## 2024-11-22 ENCOUNTER — Ambulatory Visit: Payer: Self-pay
